# Patient Record
Sex: Male | Born: 1970
Health system: Southern US, Community
[De-identification: ages and names within clinical notes are randomized; demographics above are authoritative.]

## PROBLEM LIST (undated history)

## (undated) ENCOUNTER — Inpatient Hospital Stay: Payer: Self-pay | Admitting: Internal Medicine

## (undated) VITALS — BP 118/81 | HR 92 | Temp 97.7°F | Resp 20 | Ht 71.5 in | Wt 220.0 lb

## (undated) DIAGNOSIS — G40909 Epilepsy, unspecified, not intractable, without status epilepticus: Secondary | ICD-10-CM

## (undated) DIAGNOSIS — F1010 Alcohol abuse, uncomplicated: Secondary | ICD-10-CM

## (undated) DIAGNOSIS — I10 Essential (primary) hypertension: Secondary | ICD-10-CM

## (undated) DIAGNOSIS — F32A Depression, unspecified: Secondary | ICD-10-CM

## (undated) DIAGNOSIS — K219 Gastro-esophageal reflux disease without esophagitis: Secondary | ICD-10-CM

## (undated) DIAGNOSIS — F29 Unspecified psychosis not due to a substance or known physiological condition: Secondary | ICD-10-CM

## (undated) DIAGNOSIS — F419 Anxiety disorder, unspecified: Secondary | ICD-10-CM

## (undated) DIAGNOSIS — K7460 Unspecified cirrhosis of liver: Secondary | ICD-10-CM

## (undated) DIAGNOSIS — R112 Nausea with vomiting, unspecified: Secondary | ICD-10-CM

## (undated) DIAGNOSIS — Z5189 Encounter for other specified aftercare: Secondary | ICD-10-CM

## (undated) DIAGNOSIS — D649 Anemia, unspecified: Secondary | ICD-10-CM

## (undated) DIAGNOSIS — F329 Major depressive disorder, single episode, unspecified: Secondary | ICD-10-CM

## (undated) DIAGNOSIS — F4310 Post-traumatic stress disorder, unspecified: Secondary | ICD-10-CM

## (undated) HISTORY — PX: FOOT SURGERY: SHX648

## (undated) HISTORY — PX: HAND SURGERY: SHX662

## (undated) HISTORY — DX: Nausea with vomiting, unspecified: R11.2

## (undated) HISTORY — DX: Encounter for other specified aftercare: Z51.89

---

## 2011-02-22 ENCOUNTER — Emergency Department (HOSPITAL_COMMUNITY)
Admission: EM | Admit: 2011-02-22 | Discharge: 2011-02-22 | Discharge status: Against Medical Advice | Payer: Self-pay | Attending: Emergency Medicine | Admitting: Emergency Medicine

## 2011-02-22 DIAGNOSIS — R209 Unspecified disturbances of skin sensation: Secondary | ICD-10-CM | POA: Insufficient documentation

## 2011-03-21 ENCOUNTER — Emergency Department (HOSPITAL_COMMUNITY)
Admission: EM | Admit: 2011-03-21 | Discharge: 2011-03-21 | Disposition: A | Discharge status: Home / Self Care | Payer: Self-pay | Attending: Emergency Medicine | Admitting: Emergency Medicine

## 2011-03-21 ENCOUNTER — Emergency Department (HOSPITAL_COMMUNITY): Payer: Self-pay

## 2011-03-21 DIAGNOSIS — G40909 Epilepsy, unspecified, not intractable, without status epilepticus: Secondary | ICD-10-CM | POA: Insufficient documentation

## 2011-03-21 DIAGNOSIS — M25569 Pain in unspecified knee: Secondary | ICD-10-CM | POA: Insufficient documentation

## 2011-03-21 DIAGNOSIS — F3289 Other specified depressive episodes: Secondary | ICD-10-CM | POA: Insufficient documentation

## 2011-03-21 DIAGNOSIS — F329 Major depressive disorder, single episode, unspecified: Secondary | ICD-10-CM | POA: Insufficient documentation

## 2011-03-21 LAB — ETHANOL: Alcohol, Ethyl (B): 138 mg/dL — ABNORMAL HIGH (ref 0–11)

## 2011-03-21 LAB — URINE MICROSCOPIC-ADD ON

## 2011-03-21 LAB — URINALYSIS, ROUTINE W REFLEX MICROSCOPIC
Bilirubin Urine: NEGATIVE
Glucose, UA: NEGATIVE mg/dL
Hgb urine dipstick: NEGATIVE
Ketones, ur: NEGATIVE mg/dL
Nitrite: NEGATIVE
Protein, ur: NEGATIVE mg/dL
Specific Gravity, Urine: 1.018 (ref 1.005–1.030)
Urobilinogen, UA: 0.2 mg/dL (ref 0.0–1.0)
pH: 5.5 (ref 5.0–8.0)

## 2011-03-21 LAB — RAPID URINE DRUG SCREEN, HOSP PERFORMED
Amphetamines: NOT DETECTED
Barbiturates: NOT DETECTED
Benzodiazepines: NOT DETECTED
Cocaine: NOT DETECTED
Opiates: NOT DETECTED
Tetrahydrocannabinol: POSITIVE — AB

## 2011-03-21 LAB — POCT I-STAT, CHEM 8
BUN: 10 mg/dL (ref 6–23)
Calcium, Ion: 1.15 mmol/L (ref 1.12–1.32)
Chloride: 103 mEq/L (ref 96–112)
Creatinine, Ser: 1.1 mg/dL (ref 0.50–1.35)
Glucose, Bld: 91 mg/dL (ref 70–99)
HCT: 52 % (ref 39.0–52.0)
Hemoglobin: 17.7 g/dL — ABNORMAL HIGH (ref 13.0–17.0)
Potassium: 3.8 mEq/L (ref 3.5–5.1)
Sodium: 142 mEq/L (ref 135–145)
TCO2: 26 mmol/L (ref 0–100)

## 2011-03-21 LAB — GLUCOSE, CAPILLARY: Glucose-Capillary: 105 mg/dL — ABNORMAL HIGH (ref 70–99)

## 2011-04-12 ENCOUNTER — Emergency Department (HOSPITAL_COMMUNITY)
Admission: EM | Admit: 2011-04-12 | Discharge: 2011-04-14 | Disposition: A | Discharge status: Other Acute Inpatient Hospital | Payer: Self-pay | Attending: Emergency Medicine | Admitting: Emergency Medicine

## 2011-04-12 DIAGNOSIS — F411 Generalized anxiety disorder: Secondary | ICD-10-CM | POA: Insufficient documentation

## 2011-04-12 DIAGNOSIS — F1010 Alcohol abuse, uncomplicated: Secondary | ICD-10-CM | POA: Insufficient documentation

## 2011-04-12 LAB — DIFFERENTIAL
Basophils Absolute: 0 10*3/uL (ref 0.0–0.1)
Basophils Relative: 0 % (ref 0–1)
Eosinophils Absolute: 0 10*3/uL (ref 0.0–0.7)
Eosinophils Relative: 0 % (ref 0–5)
Lymphocytes Relative: 19 % (ref 12–46)
Lymphs Abs: 1.3 10*3/uL (ref 0.7–4.0)
Monocytes Absolute: 0.6 10*3/uL (ref 0.1–1.0)
Monocytes Relative: 9 % (ref 3–12)
Neutro Abs: 4.8 10*3/uL (ref 1.7–7.7)
Neutrophils Relative %: 72 % (ref 43–77)

## 2011-04-12 LAB — HEPATIC FUNCTION PANEL
ALT: 21 U/L (ref 0–53)
AST: 29 U/L (ref 0–37)
Albumin: 4 g/dL (ref 3.5–5.2)
Alkaline Phosphatase: 95 U/L (ref 39–117)
Bilirubin, Direct: 0.1 mg/dL (ref 0.0–0.3)
Indirect Bilirubin: 0.5 mg/dL (ref 0.3–0.9)
Total Bilirubin: 0.6 mg/dL (ref 0.3–1.2)
Total Protein: 7.2 g/dL (ref 6.0–8.3)

## 2011-04-12 LAB — CBC
HCT: 50.4 % (ref 39.0–52.0)
Hemoglobin: 18 g/dL — ABNORMAL HIGH (ref 13.0–17.0)
MCH: 30.5 pg (ref 26.0–34.0)
MCHC: 35.7 g/dL (ref 30.0–36.0)
MCV: 85.3 fL (ref 78.0–100.0)
Platelets: 231 10*3/uL (ref 150–400)
RBC: 5.91 MIL/uL — ABNORMAL HIGH (ref 4.22–5.81)
RDW: 12.6 % (ref 11.5–15.5)
WBC: 6.7 10*3/uL (ref 4.0–10.5)

## 2011-04-12 LAB — BASIC METABOLIC PANEL
BUN: 16 mg/dL (ref 6–23)
CO2: 30 mEq/L (ref 19–32)
Calcium: 10.2 mg/dL (ref 8.4–10.5)
Chloride: 89 mEq/L — ABNORMAL LOW (ref 96–112)
Creatinine, Ser: 0.79 mg/dL (ref 0.50–1.35)
GFR calc Af Amer: >60 mL/min (ref >60)
GFR calc non Af Amer: >60 mL/min (ref >60)
Glucose, Bld: 145 mg/dL — ABNORMAL HIGH (ref 70–99)
Potassium: 3.2 mEq/L — ABNORMAL LOW (ref 3.5–5.1)
Sodium: 136 mEq/L (ref 135–145)

## 2011-04-12 LAB — RAPID URINE DRUG SCREEN, HOSP PERFORMED
Amphetamines: NOT DETECTED
Barbiturates: NOT DETECTED
Benzodiazepines: NOT DETECTED
Cocaine: NOT DETECTED
Opiates: NOT DETECTED
Tetrahydrocannabinol: POSITIVE — AB

## 2011-04-12 LAB — GLUCOSE, CAPILLARY: Glucose-Capillary: 145 mg/dL — ABNORMAL HIGH (ref 70–99)

## 2011-04-12 LAB — ETHANOL: Alcohol, Ethyl (B): 96 mg/dL — ABNORMAL HIGH (ref 0–11)

## 2011-04-12 LAB — PROTIME-INR
INR: 1.01 (ref 0.00–1.49)
Prothrombin Time: 13.5 seconds (ref 11.6–15.2)

## 2011-04-13 LAB — POTASSIUM: Potassium: 3.3 mEq/L — ABNORMAL LOW (ref 3.5–5.1)

## 2011-04-14 ENCOUNTER — Inpatient Hospital Stay (HOSPITAL_COMMUNITY)
Admission: AD | Admit: 2011-04-14 | Discharge: 2011-05-21 | DRG: 885 | Disposition: A | Discharge status: Other Acute Inpatient Hospital | Payer: PRIVATE HEALTH INSURANCE | Source: Ambulatory Visit | Attending: Psychiatry | Admitting: Psychiatry

## 2011-04-14 DIAGNOSIS — G250 Essential tremor: Secondary | ICD-10-CM

## 2011-04-14 DIAGNOSIS — Z5900 Homelessness unspecified: Secondary | ICD-10-CM

## 2011-04-14 DIAGNOSIS — Z111 Encounter for screening for respiratory tuberculosis: Secondary | ICD-10-CM

## 2011-04-14 DIAGNOSIS — F259 Schizoaffective disorder, unspecified: Principal | ICD-10-CM

## 2011-04-14 DIAGNOSIS — F4310 Post-traumatic stress disorder, unspecified: Secondary | ICD-10-CM

## 2011-04-14 DIAGNOSIS — Z6379 Other stressful life events affecting family and household: Secondary | ICD-10-CM

## 2011-04-14 DIAGNOSIS — Z79899 Other long term (current) drug therapy: Secondary | ICD-10-CM

## 2011-04-14 DIAGNOSIS — R45851 Suicidal ideations: Secondary | ICD-10-CM

## 2011-04-14 DIAGNOSIS — Z560 Unemployment, unspecified: Secondary | ICD-10-CM

## 2011-04-14 DIAGNOSIS — F1020 Alcohol dependence, uncomplicated: Secondary | ICD-10-CM

## 2011-04-14 DIAGNOSIS — T434X5A Adverse effect of butyrophenone and thiothixene neuroleptics, initial encounter: Secondary | ICD-10-CM

## 2011-04-14 DIAGNOSIS — R569 Unspecified convulsions: Secondary | ICD-10-CM

## 2011-04-14 DIAGNOSIS — G252 Other specified forms of tremor: Secondary | ICD-10-CM

## 2011-04-14 DIAGNOSIS — F10939 Alcohol use, unspecified with withdrawal, unspecified: Secondary | ICD-10-CM

## 2011-04-14 DIAGNOSIS — F10239 Alcohol dependence with withdrawal, unspecified: Secondary | ICD-10-CM

## 2011-04-14 DIAGNOSIS — F323 Major depressive disorder, single episode, severe with psychotic features: Secondary | ICD-10-CM

## 2011-04-15 DIAGNOSIS — F329 Major depressive disorder, single episode, unspecified: Secondary | ICD-10-CM

## 2011-04-15 DIAGNOSIS — F1020 Alcohol dependence, uncomplicated: Secondary | ICD-10-CM

## 2011-04-15 DIAGNOSIS — F3289 Other specified depressive episodes: Secondary | ICD-10-CM

## 2011-04-15 DIAGNOSIS — F4310 Post-traumatic stress disorder, unspecified: Secondary | ICD-10-CM

## 2011-04-19 DIAGNOSIS — F4310 Post-traumatic stress disorder, unspecified: Secondary | ICD-10-CM

## 2011-04-19 DIAGNOSIS — F1020 Alcohol dependence, uncomplicated: Secondary | ICD-10-CM

## 2011-04-19 DIAGNOSIS — F259 Schizoaffective disorder, unspecified: Secondary | ICD-10-CM

## 2011-04-24 NOTE — Assessment & Plan Note (Signed)
[name], [name]             ACCOUNT NO.:  [id number]  MEDICAL RECORD NO.:  [id number]  LOCATION:  0300                          FACILITY:  [facility]  PHYSICIAN:  Orson Aloe, MD       DATE OF BIRTH:  12/21/1970  DATE OF ADMISSION:  04/14/2011 DATE OF DISCHARGE:                      PSYCHIATRIC ADMISSION ASSESSMENT   HISTORY OF PRESENT ILLNESS:  This is a 40 year old divorced white male. He presented to the emergency department after an alcohol withdrawal seizure on April 12, 2011.  He was also reporting auditory hallucinations and horrifying nightmare.  He stated that he has had auditory hallucinations for about 10 years.  He was previously treated at the [facility].  He came here to [place] in February as his sister lives here.  About 3 years ago, he developed PTSD on top of his major depressive disorder with psychotic features.  His then fiance had suicided by overdose.  He was trying to perform CPR on her, and this is what his PTSD is from.  PAST PSYCHIATRIC HISTORY:  He reports 1 prior admission at the [facility] [place].  He has had multiple drug trials including Prozac, Risperdal, Seroquel, Klonopin, prazosin, Ambien, Abilify, Seroquel.  He states that the Seroquel seemed to have been the most effective with the hallucinations.  However, it made him feel stiff.  He had been noncompliant with medications since January or February.  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  He has a 93- year-old daughter.  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  He also presented to [name] on August 3.  At that time, his alcohol level was actually higher at 138.  He was also positive for marijuana then.  His neighbor called as he witnessed him having a seizure.  FAMILY HISTORY:  His mother abused alcohol.  ALCOHOL AND DRUG HISTORY:  He has  been using marijuana since age 61, alcohol since 22.  He was sober from 2004 to 2007.  He had minor relapsed until 2009.  This is when his fiance suicided, and he has been pretty much on a steady drunk since then.  PRIMARY CARE PROVIDER:  He has no PCP.  MEDICAL PROBLEMS:  None are known.  MEDICATIONS:  None recently.  DRUG ALLERGIES:  No known drug allergies.  POSITIVE PHYSICAL FINDINGS:  When he was admitted 2 days ago at [facility], he was afebrile.  His pulse ranged from 75-85.  His respirations were 18-24.  His blood pressure was 132/82 to 155/90.  His alcohol level was 96.  Potassium was slightly low at 3.8.  He was positive for marijuana.  MENTAL STATUS EXAM:  Tonight he is alert and oriented.  He was casually but appropriately groomed and dressed in his own clothing.  His speech is not pressured.  His mood is depressed.  His affect is congruent. Thought processes are clear, rational and goal oriented.  He is wanting help.  Judgment and insight are intact.  Concentration and memory are intact.  Intelligence is average.  He is still having suicidal thoughts,  although they are passive in nature, and he is still having auditory hallucinations.  He says it is the voices of Prudy Feeler and Jesus.  The voices tell him that he is not worthy of living.  They do not command him to do anything, but he is terrified to sleep at night or to close his eyes due to him seeing what he describes as Hell x10.  The nightmares he experiences are worse than anyone can imagine, and he drinks to pass out and forget his dreams.  He denied being suicidal.  ADMISSION DIAGNOSES:  AXIS I:  Diagnosis is alcohol dependence with withdrawal seizures, major depressive disorder with psychotic features, auditory hallucinations, PTSD from performing CPR on his fiance who succumbed to her overdose. AXIS III:  No diagnosis is known. AXIS IV:  Poor support, financial job loss. AXIS V:  35.  PLAN:  The plan is to  admit for further support through medically supported detox from alcohol.  He was started on the low-dose Librium protocol.  He will discuss in the morning what medications make sense for him at this point in time, and he is aware of not having any insurance and that the cost of some of these medications.  Will get his records from [facility], and he will consider a longer term substance abuse treatment program to help support his sobriety.     Mickie Leonarda Salon, P.A.-C.   ______________________________ Orson Aloe, MD    MD/MEDQ  D:  04/14/2011  T:  04/15/2011  Job:  [id number]  Electronically Signed by Jaci Lazier ADAMS P.A.-C. on 04/20/2011 01:07:40 PM Electronically Signed by Orson Aloe  on 04/24/2011 04:34:20 PM

## 2011-04-25 LAB — COMPREHENSIVE METABOLIC PANEL
ALT: 41 U/L (ref 0–53)
AST: 25 U/L (ref 0–37)
Albumin: 3.8 g/dL (ref 3.5–5.2)
Alkaline Phosphatase: 81 U/L (ref 39–117)
BUN: 19 mg/dL (ref 6–23)
CO2: 28 mEq/L (ref 19–32)
Calcium: 9.3 mg/dL (ref 8.4–10.5)
Chloride: 100 mEq/L (ref 96–112)
Creatinine, Ser: 0.94 mg/dL (ref 0.50–1.35)
GFR calc Af Amer: >60 mL/min (ref >60)
GFR calc non Af Amer: >60 mL/min (ref >60)
Glucose, Bld: 108 mg/dL — ABNORMAL HIGH (ref 70–99)
Potassium: 4.5 mEq/L (ref 3.5–5.1)
Sodium: 138 mEq/L (ref 135–145)
Total Bilirubin: 0.2 mg/dL — ABNORMAL LOW (ref 0.3–1.2)
Total Protein: 6.8 g/dL (ref 6.0–8.3)

## 2011-04-25 LAB — DIFFERENTIAL
Basophils Absolute: 0 10*3/uL (ref 0.0–0.1)
Basophils Relative: 1 % (ref 0–1)
Eosinophils Absolute: 0.1 10*3/uL (ref 0.0–0.7)
Eosinophils Relative: 1 % (ref 0–5)
Lymphocytes Relative: 30 % (ref 12–46)
Lymphs Abs: 2.1 10*3/uL (ref 0.7–4.0)
Monocytes Absolute: 0.8 10*3/uL (ref 0.1–1.0)
Monocytes Relative: 11 % (ref 3–12)
Neutro Abs: 3.9 10*3/uL (ref 1.7–7.7)
Neutrophils Relative %: 57 % (ref 43–77)

## 2011-04-25 LAB — CBC
HCT: 39.5 % (ref 39.0–52.0)
Hemoglobin: 13.2 g/dL (ref 13.0–17.0)
MCH: 30.2 pg (ref 26.0–34.0)
MCHC: 33.4 g/dL (ref 30.0–36.0)
MCV: 90.4 fL (ref 78.0–100.0)
Platelets: 360 10*3/uL (ref 150–400)
RBC: 4.37 MIL/uL (ref 4.22–5.81)
RDW: 13.4 % (ref 11.5–15.5)
WBC: 6.9 10*3/uL (ref 4.0–10.5)

## 2011-04-26 LAB — CARBAMAZEPINE LEVEL, TOTAL: Carbamazepine Lvl: 6.9 ug/mL (ref 4.0–12.0)

## 2011-04-26 LAB — TSH: TSH: 1.417 u[IU]/mL (ref 0.350–4.500)

## 2011-04-26 LAB — T4, FREE: Free T4: 1.08 ng/dL (ref 0.80–1.80)

## 2011-04-26 LAB — T3, FREE: T3, Free: 2.7 pg/mL (ref 2.3–4.2)

## 2011-04-28 LAB — CARBAMAZEPINE LEVEL, TOTAL: Carbamazepine Lvl: 7.5 ug/mL (ref 4.0–12.0)

## 2011-05-18 LAB — COMPREHENSIVE METABOLIC PANEL
ALT: 262 U/L — ABNORMAL HIGH (ref 0–53)
AST: 170 U/L — ABNORMAL HIGH (ref 0–37)
Albumin: 4.2 g/dL (ref 3.5–5.2)
Alkaline Phosphatase: 102 U/L (ref 39–117)
BUN: 16 mg/dL (ref 6–23)
CO2: 27 mEq/L (ref 19–32)
Calcium: 10 mg/dL (ref 8.4–10.5)
Chloride: 99 mEq/L (ref 96–112)
Creatinine, Ser: 0.85 mg/dL (ref 0.50–1.35)
GFR calc Af Amer: >60 mL/min (ref >60)
GFR calc non Af Amer: >60 mL/min (ref >60)
Glucose, Bld: 88 mg/dL (ref 70–99)
Potassium: 3.9 mEq/L (ref 3.5–5.1)
Sodium: 138 mEq/L (ref 135–145)
Total Bilirubin: 0.2 mg/dL — ABNORMAL LOW (ref 0.3–1.2)
Total Protein: 7.4 g/dL (ref 6.0–8.3)

## 2011-05-18 LAB — DIFFERENTIAL
Basophils Absolute: 0 10*3/uL (ref 0.0–0.1)
Basophils Relative: 1 % (ref 0–1)
Eosinophils Absolute: 0.2 10*3/uL (ref 0.0–0.7)
Eosinophils Relative: 4 % (ref 0–5)
Lymphocytes Relative: 37 % (ref 12–46)
Lymphs Abs: 2.1 10*3/uL (ref 0.7–4.0)
Monocytes Absolute: 0.7 10*3/uL (ref 0.1–1.0)
Monocytes Relative: 12 % (ref 3–12)
Neutro Abs: 2.6 10*3/uL (ref 1.7–7.7)
Neutrophils Relative %: 47 % (ref 43–77)

## 2011-05-18 LAB — CBC
HCT: 42.1 % (ref 39.0–52.0)
Hemoglobin: 14.1 g/dL (ref 13.0–17.0)
MCH: 30.1 pg (ref 26.0–34.0)
MCHC: 33.5 g/dL (ref 30.0–36.0)
MCV: 89.8 fL (ref 78.0–100.0)
Platelets: 206 10*3/uL (ref 150–400)
RBC: 4.69 MIL/uL (ref 4.22–5.81)
RDW: 13.6 % (ref 11.5–15.5)
WBC: 5.6 10*3/uL (ref 4.0–10.5)

## 2011-05-18 LAB — CARBAMAZEPINE LEVEL, TOTAL: Carbamazepine Lvl: 6.4 ug/mL (ref 4.0–12.0)

## 2011-05-19 DIAGNOSIS — F329 Major depressive disorder, single episode, unspecified: Secondary | ICD-10-CM

## 2011-05-19 DIAGNOSIS — F1020 Alcohol dependence, uncomplicated: Secondary | ICD-10-CM

## 2011-05-19 DIAGNOSIS — F3289 Other specified depressive episodes: Secondary | ICD-10-CM

## 2011-05-20 NOTE — Discharge Summary (Signed)
NAMECHAD, [name]             ACCOUNT NO.:  [id number]  MEDICAL RECORD NO.:  [id number]  LOCATION:  0503                          FACILITY:  [facility]  PHYSICIAN:  Franchot Gallo, MD     DATE OF BIRTH:  Dec 19, 1970  DATE OF ADMISSION:  04/14/2011 DATE OF DISCHARGE:  05/20/2011                              DISCHARGE SUMMARY   REASON FOR ADMISSION:  This is a 40 year old male who was admitted after he initially had an alcohol withdrawal seizure on August 25th.  He was reporting auditory hallucinations and horrifying nightmares.  Stated he had been having auditory hallucinations for approximately 10 years.  He also has a history of PTSD and is reporting that one of his significant stressors is that he is homeless.  FINAL IMPRESSION:  AXIS I:  Schizoaffective disorder, depressed type. Posttraumatic stress disorder. AXIS II:  Deferred. AXIS III:  No acute medical issues. AXIS IV:  Homelessness, financial constraints, unemployment, chronic mental issues. AXIS V:  GAF at discharge is 40.  PERTINENT LABORATORIES:  Alcohol level was at 96.  Potassium was initially low at 3.8.  Urine drug screen was positive for marijuana.  SIGNIFICANT FINDINGS:  The patient was admitted to the adult milieu for safety and stabilization.  He was started on low-dose Librium, and he was assessed for further comorbidities and initiation of medications. He was active in attending discharge planning groups.  He was, however, endorsing suicidal thoughts.  We had contact with [facility] to address any safety issues and for [place] to provide information on suicide risk factors, prevention and intervention.  Early in his admission, the patient had attempted to try to make a rope out of his gown to hang himself but then offered the gown to the nurse in the hall and was able to contract for safety.  He continued to endorse suicidal thoughts with a plan to overdose and was also endorsing auditory hallucinations with  paranoid ideation.  He was having significant anxiety, and we increased his Risperdal.  The patient was interested to be transferred to [facility] for further evaluation and long-term management of his symptoms.  The patient was feeling very overwhelmed by his auditory hallucinations. He paced the floor and continued to endorse suicidal thinking.  He detoxed well off his use of alcohol and had no significant withdrawal symptoms at this time.  The patient continued to endorse constant suicidality but having no homicidal thinking.  He also reported constant auditory hallucinations that were worse at night, seeing distortions and rating his hopelessness a 10 on a scale of 1 to 10.  We discontinued his Risperdal and started Seroquel to help with his psychotic symptoms, changed his Klonopin to help with his anxiety, Tegretol for mood stabilization and ordered further labs.  We continued to modify the patient's dosages of his medicine, increased his Seroquel to lessen his psychotic symptoms and help with his anxiety.  The patient continued to voice that he was wanting to be transferred to the [facility].  Throughout most of his stay, the patient endorsed constant suicidal thoughts without plan or intent, and reporting that voices were present and strong and seeing shadow people.  He continued to rate his  depression high, an 8 to 9 on a scale of 1 to 10, and his hopelessness an 8 on a scale of 1 to 10.  The voices and his delusions were basically unchanged.  We increased his Prozac to help with his depressive symptoms.  His sleep was improving some, and we continued to wait for transfer to [facility].  For the next several days, the patient had very little improvement.  His sleep was better.  His appetite had improved, but the voices were constant, and he continued to endorse suicidal thinking.  He did confide in the staff that he was hearing voices.  One of them was Jesus and the other  was Lucifer.  He was reporting that they talked to him at the same time, telling him they wanted to stab him and perform a stigma on him.  He reported just slight improvement in his symptoms, began to overall continue with again the voices and suicidal thinking.  The patient had a tremor with the Haldol.  His vision was blurred.  At that time, we discontinued his Haldol and tried Thorazine 25 mg t.i.d. and 50 mg at bedtime to help with his psychotic symptoms.  On October 1, the patient was reporting good sleep and good appetite but continuing with severe depressive symptoms, feeling very frustrated.  Continued with episodic suicidal thinking without plan or intent.  Denied any homicidal thinking.  His auditory and visual hallucinations continued, but he was able to cope with the Thorazine.  He also rated his anxiety an 8 on a scale of 1 to 10.  His liver enzymes at this time were elevated with an SGOT of 170, SGPT of 262.  Serum Tegretol level was 6.4.  We discontinued his Tegretol at this time due to increase in liver enzymes with our intention to repeat his liver enzymes the following day.  On the day of transfer, the patient was reporting good sleep, good appetite, having severe depressive symptoms, rating an 8 on a scale of 1 to 10.  Continued with episodic suicidal thinking without current plan or intent, having current auditory and visual hallucinations.  Anxiety was rated an 8, severe, on a scale of 1 to 10, but reporting no medication side effects.  DISCHARGE MEDICATIONS: 1. Minipress 4 mg with a repeat in 1 hour if not asleep. 2. Multivitamin 1 daily. 3. Seroquel 200 mg at 6:00 a.m., 100 mg at 2:00 p.m., 500 mg at     bedtime. 4. Klonopin 1 mg at 8:00 a.m., 1 mg at 2:00 p.m., 2 mg at bedtime. 5. Prozac 80 mg daily. 6. Thorazine 50 mg at 6:00 a.m., 100 mg at 12:00 noon, 4:00 p.m. and     at 10:00 p.m. 7. Cogentin 0.5 mg 1 at 6:00 a.m., 12:00 noon, 4:00 p.m., 10:00 p.m. 8.  The patient received a PPD.  FOLLOWUP:  The patient is to have his liver enzymes checked as soon as possible for further monitoring.     Landry Corporal, N.P.   ______________________________ Franchot Gallo, MD    JO/MEDQ  D:  05/20/2011  T:  05/20/2011  Job:  [id number]  Electronically Signed by Limmie PatriciaP. on 05/20/2011 04:14:20 PM Electronically Signed by Franchot Gallo MD on 05/20/2011 04:45:28 PM

## 2011-05-21 LAB — COMPREHENSIVE METABOLIC PANEL
ALT: 436 U/L — ABNORMAL HIGH (ref 0–53)
AST: 167 U/L — ABNORMAL HIGH (ref 0–37)
Albumin: 4.4 g/dL (ref 3.5–5.2)
Alkaline Phosphatase: 135 U/L — ABNORMAL HIGH (ref 39–117)
BUN: 14 mg/dL (ref 6–23)
CO2: 28 mEq/L (ref 19–32)
Calcium: 10.5 mg/dL (ref 8.4–10.5)
Chloride: 101 mEq/L (ref 96–112)
Creatinine, Ser: 0.86 mg/dL (ref 0.50–1.35)
GFR calc Af Amer: >90 mL/min (ref >90)
GFR calc non Af Amer: >90 mL/min (ref >90)
Glucose, Bld: 88 mg/dL (ref 70–99)
Potassium: 4 mEq/L (ref 3.5–5.1)
Sodium: 138 mEq/L (ref 135–145)
Total Bilirubin: 0.4 mg/dL (ref 0.3–1.2)
Total Protein: 7.6 g/dL (ref 6.0–8.3)

## 2011-06-30 ENCOUNTER — Encounter: Payer: Self-pay | Admitting: *Deleted

## 2011-06-30 ENCOUNTER — Inpatient Hospital Stay (HOSPITAL_COMMUNITY)
Admission: EM | Admit: 2011-06-30 | Discharge: 2011-07-16 | DRG: 392 | Disposition: A | Discharge status: Other Acute Inpatient Hospital | Payer: Self-pay | Attending: Internal Medicine | Admitting: Internal Medicine

## 2011-06-30 ENCOUNTER — Other Ambulatory Visit: Payer: Self-pay

## 2011-06-30 DIAGNOSIS — E860 Dehydration: Secondary | ICD-10-CM | POA: Diagnosis present

## 2011-06-30 DIAGNOSIS — F251 Schizoaffective disorder, depressive type: Secondary | ICD-10-CM | POA: Insufficient documentation

## 2011-06-30 DIAGNOSIS — G44209 Tension-type headache, unspecified, not intractable: Secondary | ICD-10-CM | POA: Diagnosis present

## 2011-06-30 DIAGNOSIS — F1020 Alcohol dependence, uncomplicated: Secondary | ICD-10-CM | POA: Diagnosis present

## 2011-06-30 DIAGNOSIS — K529 Noninfective gastroenteritis and colitis, unspecified: Secondary | ICD-10-CM | POA: Diagnosis present

## 2011-06-30 DIAGNOSIS — K5900 Constipation, unspecified: Secondary | ICD-10-CM | POA: Diagnosis not present

## 2011-06-30 DIAGNOSIS — F1910 Other psychoactive substance abuse, uncomplicated: Secondary | ICD-10-CM | POA: Diagnosis present

## 2011-06-30 DIAGNOSIS — F4310 Post-traumatic stress disorder, unspecified: Secondary | ICD-10-CM | POA: Diagnosis present

## 2011-06-30 DIAGNOSIS — F259 Schizoaffective disorder, unspecified: Secondary | ICD-10-CM | POA: Diagnosis present

## 2011-06-30 DIAGNOSIS — F329 Major depressive disorder, single episode, unspecified: Secondary | ICD-10-CM | POA: Diagnosis present

## 2011-06-30 DIAGNOSIS — F10939 Alcohol use, unspecified with withdrawal, unspecified: Secondary | ICD-10-CM | POA: Diagnosis present

## 2011-06-30 DIAGNOSIS — F1010 Alcohol abuse, uncomplicated: Secondary | ICD-10-CM | POA: Diagnosis present

## 2011-06-30 DIAGNOSIS — F17200 Nicotine dependence, unspecified, uncomplicated: Secondary | ICD-10-CM | POA: Diagnosis present

## 2011-06-30 DIAGNOSIS — R45851 Suicidal ideations: Secondary | ICD-10-CM

## 2011-06-30 DIAGNOSIS — Z765 Malingerer [conscious simulation]: Secondary | ICD-10-CM

## 2011-06-30 DIAGNOSIS — F3289 Other specified depressive episodes: Secondary | ICD-10-CM | POA: Diagnosis present

## 2011-06-30 DIAGNOSIS — F10239 Alcohol dependence with withdrawal, unspecified: Secondary | ICD-10-CM | POA: Diagnosis present

## 2011-06-30 DIAGNOSIS — E876 Hypokalemia: Secondary | ICD-10-CM | POA: Diagnosis not present

## 2011-06-30 DIAGNOSIS — F10929 Alcohol use, unspecified with intoxication, unspecified: Secondary | ICD-10-CM | POA: Diagnosis present

## 2011-06-30 DIAGNOSIS — K5289 Other specified noninfective gastroenteritis and colitis: Principal | ICD-10-CM | POA: Diagnosis present

## 2011-06-30 HISTORY — DX: Epilepsy, unspecified, not intractable, without status epilepticus: G40.909

## 2011-06-30 HISTORY — DX: Depression, unspecified: F32.A

## 2011-06-30 HISTORY — DX: Unspecified psychosis not due to a substance or known physiological condition: F29

## 2011-06-30 HISTORY — DX: Major depressive disorder, single episode, unspecified: F32.9

## 2011-06-30 HISTORY — DX: Alcohol abuse, uncomplicated: F10.10

## 2011-06-30 HISTORY — DX: Post-traumatic stress disorder, unspecified: F43.10

## 2011-06-30 LAB — CBC
HCT: 50 % (ref 39.0–52.0)
Hemoglobin: 17.8 g/dL — ABNORMAL HIGH (ref 13.0–17.0)
MCH: 31.3 pg (ref 26.0–34.0)
MCHC: 35.6 g/dL (ref 30.0–36.0)
MCV: 88 fL (ref 78.0–100.0)
Platelets: 344 10*3/uL (ref 150–400)
RBC: 5.68 MIL/uL (ref 4.22–5.81)
RDW: 12.8 % (ref 11.5–15.5)
WBC: 7.2 10*3/uL (ref 4.0–10.5)

## 2011-06-30 LAB — COMPREHENSIVE METABOLIC PANEL
ALT: 35 U/L (ref 0–53)
AST: 36 U/L (ref 0–37)
Albumin: 4.4 g/dL (ref 3.5–5.2)
Alkaline Phosphatase: 102 U/L (ref 39–117)
BUN: 22 mg/dL (ref 6–23)
CO2: 31 mEq/L (ref 19–32)
Calcium: 9.3 mg/dL (ref 8.4–10.5)
Chloride: 92 mEq/L — ABNORMAL LOW (ref 96–112)
Creatinine, Ser: 0.77 mg/dL (ref 0.50–1.35)
GFR calc Af Amer: >90 mL/min (ref >90)
GFR calc non Af Amer: >90 mL/min (ref >90)
Glucose, Bld: 123 mg/dL — ABNORMAL HIGH (ref 70–99)
Potassium: 2.7 mEq/L — CL (ref 3.5–5.1)
Sodium: 139 mEq/L (ref 135–145)
Total Bilirubin: 0.9 mg/dL (ref 0.3–1.2)
Total Protein: 7.6 g/dL (ref 6.0–8.3)

## 2011-06-30 LAB — POCT I-STAT, CHEM 8
BUN: 23 mg/dL (ref 6–23)
Calcium, Ion: 0.97 mmol/L — ABNORMAL LOW (ref 1.12–1.32)
Chloride: 94 mEq/L — ABNORMAL LOW (ref 96–112)
Creatinine, Ser: 1.2 mg/dL (ref 0.50–1.35)
Glucose, Bld: 126 mg/dL — ABNORMAL HIGH (ref 70–99)
HCT: 54 % — ABNORMAL HIGH (ref 39.0–52.0)
Hemoglobin: 18.4 g/dL — ABNORMAL HIGH (ref 13.0–17.0)
Potassium: 2.7 mEq/L — CL (ref 3.5–5.1)
Sodium: 139 mEq/L (ref 135–145)
TCO2: 33 mmol/L (ref 0–100)

## 2011-06-30 LAB — RAPID URINE DRUG SCREEN, HOSP PERFORMED
Amphetamines: NOT DETECTED
Barbiturates: NOT DETECTED
Benzodiazepines: NOT DETECTED
Cocaine: NOT DETECTED
Opiates: NOT DETECTED
Tetrahydrocannabinol: POSITIVE — AB

## 2011-06-30 LAB — MAGNESIUM: Magnesium: 2.4 mg/dL (ref 1.5–2.5)

## 2011-06-30 LAB — MRSA PCR SCREENING: MRSA by PCR: NEGATIVE

## 2011-06-30 LAB — PHOSPHORUS: Phosphorus: 2.6 mg/dL (ref 2.3–4.6)

## 2011-06-30 LAB — ETHANOL: Alcohol, Ethyl (B): 211 mg/dL — ABNORMAL HIGH (ref 0–11)

## 2011-06-30 MED ORDER — LORAZEPAM 1 MG PO TABS
1.0000 mg | ORAL_TABLET | Freq: Four times a day (QID) | ORAL | Status: AC | PRN
  Administered 2011-06-30 – 2011-07-02 (×2): 1 mg via ORAL
  Filled 2011-06-30 (×2): qty 1

## 2011-06-30 MED ORDER — FOLIC ACID 5 MG/ML IJ SOLN
1.0000 mg | Freq: Every day | INTRAMUSCULAR | Status: DC
  Administered 2011-06-30: 1 mg via INTRAVENOUS
  Filled 2011-06-30 (×2): qty 0.2

## 2011-06-30 MED ORDER — POTASSIUM CHLORIDE CRYS ER 20 MEQ PO TBCR
40.0000 meq | EXTENDED_RELEASE_TABLET | Freq: Once | ORAL | Status: AC
  Administered 2011-06-30: 40 meq via ORAL
  Filled 2011-06-30: qty 2

## 2011-06-30 MED ORDER — THIAMINE HCL 100 MG/ML IJ SOLN
Freq: Once | INTRAVENOUS | Status: AC
  Administered 2011-06-30: 13:00:00 via INTRAVENOUS
  Filled 2011-06-30: qty 1000

## 2011-06-30 MED ORDER — NICOTINE 21 MG/24HR TD PT24
21.0000 mg | MEDICATED_PATCH | Freq: Every day | TRANSDERMAL | Status: DC
  Administered 2011-06-30 – 2011-07-16 (×17): 21 mg via TRANSDERMAL
  Filled 2011-06-30 (×17): qty 1

## 2011-06-30 MED ORDER — ONDANSETRON HCL 4 MG PO TABS: 4.0000 mg | ORAL_TABLET | Freq: Four times a day (QID) | ORAL | Status: DC | PRN

## 2011-06-30 MED ORDER — ACETAMINOPHEN 650 MG RE SUPP: 650.0000 mg | Freq: Four times a day (QID) | RECTAL | Status: DC | PRN

## 2011-06-30 MED ORDER — POTASSIUM CHLORIDE 10 MEQ/100ML IV SOLN
10.0000 meq | Freq: Once | INTRAVENOUS | Status: AC
  Administered 2011-06-30: 10 meq via INTRAVENOUS
  Filled 2011-06-30: qty 100

## 2011-06-30 MED ORDER — OLANZAPINE 5 MG PO TABS
5.0000 mg | ORAL_TABLET | Freq: Every day | ORAL | Status: DC
  Administered 2011-06-30: 5 mg via ORAL
  Filled 2011-06-30 (×3): qty 1

## 2011-06-30 MED ORDER — THIAMINE HCL 100 MG/ML IJ SOLN
100.0000 mg | Freq: Every day | INTRAMUSCULAR | Status: DC
  Administered 2011-06-30: 100 mg via INTRAVENOUS
  Filled 2011-06-30 (×2): qty 1

## 2011-06-30 MED ORDER — ACETAMINOPHEN 325 MG PO TABS
650.0000 mg | ORAL_TABLET | Freq: Once | ORAL | Status: AC
  Administered 2011-06-30: 650 mg via ORAL

## 2011-06-30 MED ORDER — LORAZEPAM 2 MG/ML IJ SOLN
0.0000 mg | Freq: Four times a day (QID) | INTRAMUSCULAR | Status: AC
  Administered 2011-06-30 – 2011-07-02 (×6): 1 mg via INTRAVENOUS
  Filled 2011-06-30 (×5): qty 1

## 2011-06-30 MED ORDER — LORAZEPAM 2 MG/ML IJ SOLN
0.0000 mg | Freq: Two times a day (BID) | INTRAMUSCULAR | Status: AC
  Administered 2011-07-02: 1 mg via INTRAVENOUS
  Administered 2011-07-03: 2 mg via INTRAVENOUS
  Administered 2011-07-03: 1 mg via INTRAVENOUS
  Administered 2011-07-04: 2 mg via INTRAVENOUS
  Filled 2011-06-30 (×4): qty 1

## 2011-06-30 MED ORDER — ONDANSETRON HCL 4 MG/2ML IJ SOLN
4.0000 mg | Freq: Four times a day (QID) | INTRAMUSCULAR | Status: DC | PRN
  Administered 2011-07-01: 4 mg via INTRAVENOUS
  Filled 2011-06-30: qty 2

## 2011-06-30 MED ORDER — ACETAMINOPHEN 325 MG PO TABS
650.0000 mg | ORAL_TABLET | Freq: Four times a day (QID) | ORAL | Status: DC | PRN
  Administered 2011-07-02 – 2011-07-06 (×3): 650 mg via ORAL
  Filled 2011-06-30 (×3): qty 2

## 2011-06-30 MED ORDER — SODIUM CHLORIDE 0.9 % IV BOLUS (SEPSIS)
500.0000 mL | Freq: Once | INTRAVENOUS | Status: AC
  Administered 2011-06-30: 500 mL via INTRAVENOUS

## 2011-06-30 MED ORDER — PANTOPRAZOLE SODIUM 40 MG IV SOLR
40.0000 mg | INTRAVENOUS | Status: DC
  Administered 2011-06-30: 40 mg via INTRAVENOUS
  Filled 2011-06-30 (×2): qty 40

## 2011-06-30 MED ORDER — FLUOXETINE HCL 20 MG PO CAPS
60.0000 mg | ORAL_CAPSULE | Freq: Every day | ORAL | Status: DC
  Administered 2011-06-30 – 2011-07-16 (×17): 60 mg via ORAL
  Filled 2011-06-30 (×17): qty 3

## 2011-06-30 MED ORDER — SODIUM CHLORIDE 0.9 % IV SOLN
INTRAVENOUS | Status: DC
  Administered 2011-06-30 – 2011-07-01 (×2): 125 mL/h via INTRAVENOUS

## 2011-06-30 MED ORDER — ACETAMINOPHEN 325 MG PO TABS
ORAL_TABLET | ORAL | Status: AC
  Administered 2011-06-30: 650 mg via ORAL
  Filled 2011-06-30: qty 2

## 2011-06-30 MED ORDER — LORAZEPAM 2 MG/ML IJ SOLN
1.0000 mg | Freq: Once | INTRAMUSCULAR | Status: AC
  Administered 2011-06-30: 1 mg via INTRAVENOUS
  Filled 2011-06-30: qty 1

## 2011-06-30 MED ORDER — HYDROMORPHONE HCL PF 1 MG/ML IJ SOLN: 1.0000 mg | INTRAMUSCULAR | Status: DC | PRN

## 2011-06-30 MED ORDER — LORAZEPAM 2 MG/ML IJ SOLN
1.0000 mg | Freq: Four times a day (QID) | INTRAMUSCULAR | Status: AC | PRN
  Administered 2011-07-01 (×2): 1 mg via INTRAVENOUS
  Filled 2011-06-30 (×3): qty 1

## 2011-06-30 MED ORDER — OXYCODONE HCL 5 MG PO TABS
5.0000 mg | ORAL_TABLET | ORAL | Status: DC | PRN
  Administered 2011-07-01 – 2011-07-09 (×26): 5 mg via ORAL
  Filled 2011-06-30 (×26): qty 1

## 2011-06-30 MED ORDER — HEPARIN SODIUM (PORCINE) 5000 UNIT/ML IJ SOLN
5000.0000 [IU] | Freq: Three times a day (TID) | INTRAMUSCULAR | Status: DC
  Administered 2011-06-30 – 2011-07-06 (×17): 5000 [IU] via SUBCUTANEOUS
  Filled 2011-06-30 (×20): qty 1

## 2011-06-30 MED ORDER — ONDANSETRON HCL 4 MG/2ML IJ SOLN
4.0000 mg | Freq: Once | INTRAMUSCULAR | Status: AC
  Administered 2011-06-30: 4 mg via INTRAVENOUS
  Filled 2011-06-30: qty 2

## 2011-06-30 NOTE — ED Notes (Addendum)
Pt states he was discharged from [facility] in [place] 2 weeks ago where he was seen for ETOH abuse and PTSD. States she went approximately without drinking and started drinking 5-6 [employer]/alcohol drinks per day x 1 week. States he stopped drinking at 0330 this am because he wants help. C/O nausea, vomiting, and agitation. Also states he had a seizure "early this morning" witnessed by girlfriend, unable to give a length of time. Describes as full body movement. States he has a hx of seizures when withdrawing from ETOH in the past.

## 2011-06-30 NOTE — ED Notes (Signed)
Report called to Molly Maduro, Charity fundraiser. Requests we wait about 10 minutes before transporting pt upstairs.

## 2011-06-30 NOTE — Discharge Planning (Signed)
ED CM provided pt with a housing, financial and medication assistance (including [facility] contact information) During previous CM visit. CM will refer pt to SW for consult for homelessness services

## 2011-06-30 NOTE — ED Notes (Signed)
Dr. Nicanor Alcon notified of pt's headache.

## 2011-06-30 NOTE — Discharge Planning (Signed)
ED CM noted no pcp self pay [place] pt. Spoke with and provided pt with with written information for self pay pcps.  Pt voiced understanding Left near pt belongs

## 2011-06-30 NOTE — ED Notes (Signed)
Pt states "last drink around 0300 or 0400"; girlfriend states "I don't know what time he had the sz but it was scaring me to death"

## 2011-06-30 NOTE — ED Notes (Signed)
Shanda Bumps, pt girlfriend # [phone]

## 2011-06-30 NOTE — ED Provider Notes (Signed)
History     CSN: [id number] Arrival date & time: 06/30/2011  9:55 AM   First MD Initiated Contact with Patient 06/30/11 1019      Chief Complaint  Patient presents with  . Alcohol Intoxication    (Consider location/radiation/quality/duration/timing/severity/associated sxs/prior treatment) Patient is a 40 y.o. male presenting with intoxication. The history is provided by the patient and the spouse. No language interpreter was used.  Alcohol Intoxication This is a recurrent problem. The current episode started 12 to 24 hours ago. The problem occurs constantly. The problem has not changed since onset.Pertinent negatives include no chest pain, no abdominal pain, no headaches and no shortness of breath. The symptoms are aggravated by nothing. The symptoms are relieved by nothing. He has tried nothing for the symptoms. The treatment provided no relief.  Last drink was 330 am and had a withdrawal seizure.  He has had these in the past when he tried to stop.  Wants to go back to detox was just there 2 weeks ago.    Past Medical History  Diagnosis Date  . Depression   . Psychosis   . PTSD (post-traumatic stress disorder)     Past Surgical History  Procedure Date  . Foot surgery     right    No family history on file.  History  Substance Use Topics  . Smoking status: Current Everyday Smoker -- 2.0 packs/day  . Smokeless tobacco: Not on file  . Alcohol Use: Yes     1 fifth per day      Review of Systems  Constitutional: Negative for activity change.  HENT: Negative for facial swelling.   Eyes: Negative for discharge.  Respiratory: Negative for shortness of breath.   Cardiovascular: Negative for chest pain.  Gastrointestinal: Negative for abdominal pain and abdominal distention.  Genitourinary: Negative for difficulty urinating.  Musculoskeletal: Negative for arthralgias.  Neurological: Negative for weakness and headaches.  Hematological: Negative.     Psychiatric/Behavioral: Negative.     Allergies  Review of patient's allergies indicates no known allergies.  Home Medications  No current outpatient prescriptions on file.  BP 139/97  Pulse 108  Temp 98.1 F (36.7 C)  Resp 20  Wt 200 lb (90.719 kg)  SpO2 96%  Physical Exam  Constitutional: He is oriented to person, place, and time. He appears well-developed and well-nourished. No distress.  HENT:  Head: Normocephalic and atraumatic.  Mouth/Throat: Oropharynx is clear and moist. No oropharyngeal exudate.  Eyes: EOM are normal. Pupils are equal, round, and reactive to light. Right eye exhibits no discharge. Left eye exhibits no discharge.  Neck: Normal range of motion. Neck supple. No thyromegaly present.  Cardiovascular: Normal rate and regular rhythm.   Pulmonary/Chest: Effort normal and breath sounds normal. No respiratory distress.  Abdominal: Soft. Bowel sounds are normal. There is no tenderness. There is no rebound and no guarding.  Musculoskeletal: Normal range of motion.  Neurological: He is alert and oriented to person, place, and time.  Skin: Skin is warm and dry.  Psychiatric: Thought content normal.    ED Course  Procedures (including critical care time)   Labs Reviewed  CBC  COMPREHENSIVE METABOLIC PANEL  I-STAT, CHEM 8  URINE RAPID DRUG SCREEN (HOSP PERFORMED)  ETHANOL   No results found.   No diagnosis found.    MDM  CRITICAL CARE Performed by: Jasmine Awe   Total critical care time: 60  Critical care time was exclusive of separately billable procedures and treating other patients.  Critical care was necessary to treat or prevent imminent or life-threatening deterioration.  Critical care was time spent personally by me on the following activities: development of treatment plan with patient and/or surrogate as well as nursing, discussions with consultants, evaluation of patient's response to treatment, examination of patient,  obtaining history from patient or surrogate, ordering and performing treatments and interventions, ordering and review of laboratory studies, ordering and review of radiographic studies, pulse oximetry and re-evaluation of patient's condition. MDM Reviewed: nursing note and vitals Interpretation: labs and ECG Total time providing critical care: 30-74 minutes. This excludes time spent performing separately reportable procedures and services. Consults: admitting MD           Trinitee Horgan K Zared Knoth-Rasch, MD 06/30/11 1336

## 2011-06-30 NOTE — H&P (Signed)
PCP:   No primary provider on file.   Chief Complaint:  Vomiting and diarrhea for 3-4 days, abdominal pain. Possible seizure episode.  HPI: This is a 40 year old male, with  known history of chronic alcoholism, PTSD, schizoaffective disorder depressive type, status [facility], [facility] one in [facility], presenting with what appears to have been a "seizure" at approximately 40:00 a.m on 06/30/2011. according to patient over the past 3-4 days had vomiting and diarrhea at least 4-6 episodes. The he describes the diarrhea as consisting of "dark stools", denies ingestion of any unusual foods or clustering of cases, denies fever or chills. He also admits to periumbilical pain over that period of time.  His last drink was at about 3 AM on 06/30/11, and according to patient he awoke at 400 a.m on the same day, following what his girlfriend described as a "seizure".  Be that as it may, he presented to the emergency department. He was administered at Ativan, and referred to the medical service mission for further management.  Allergies:  No Known Allergies    Past Medical History  Diagnosis Date  . Depression   . Psychosis   . PTSD (post-traumatic stress disorder)   . Seizure disorder     related to etoh seizure    Past Surgical History  Procedure Date  . Foot surgery     right    Prior to Admission medications   Medication Sig Start Date End Date Taking? Authorizing Provider  FLUoxetine (PROZAC) 20 MG capsule Take 60 mg by mouth daily. Patient takes (3) 20mg  capsules daily    Yes Historical Provider, MD  OLANZapine (ZYPREXA) 2.5 MG tablet Take 5 mg by mouth at bedtime. Patient states that he is taking 5mg .    Yes Historical Provider, MD  prazosin (MINIPRESS) 2 MG capsule Take 4 mg by mouth at bedtime. Patient takes (2) 4mg  daily    Yes Historical Provider, MD    Social History: He smokes a pack to one and a half pack of cigarettes a day  for the past 20 years.  He has never used smokeless tobacco. He reports that he drinks alcohol, about 5-6, 24 oz cans a day, and smoke marijuana.  Family history: Patient's mother is aged 74 years, and is otherwise healthy, apart from glaucoma. His fathwer died at age 35 years, from a heart attack.  Review of Systems:  As per HPI and chief complaint. Patent denies fatigue, diminished appetite, weight loss, fever, chills, headache, blurred vision, difficulty in speaking, dysphagia, chest pain, cough, shortness of breath, orthopnea, paroxysmal nocturnal dyspnea, belching, heartburn, hematemesis, melena, dysuria, nocturia, urinary frequency, hematochezia, lower extremity swelling, pain, or redness. The rest of the systems review is negative.  Physical Exam:  General:  Patient does not appear to be in obvious acute distress. Alert, communicative, fully oriented, talking in complete sentences, not short of breath at rest.  HEENT:  No clinical pallor, no jaundice, no conjunctival injection or discharge. Mucosae appear "dry". NECK:  Supple, JVP not seen, no carotid bruits, no palpable lymphadenopathy, no palpable goiter. CHEST:  Clinically clear to auscultation, no wheezes, no crackles. HEART:  Sounds 1 and 2 heard, normal, regular, no murmurs. ABDOMEN:  Full, soft, only mild to moderate periumbilical discomfort, no palpable organomegaly, no palpable masses, normal bowel sounds. GENITALIA:  Normal external genitalia. LOWER EXTREMITIES:  No pitting edema, palpable peripheral pulses. MUSCULOSKELETAL SYSTEM:  Generalized osteoarthritic changes, otherwise, normal. CENTRAL NERVOUS SYSTEM:  No focal neurologic deficit on gross examination.  Labs on Admission:  Results for orders placed during the hospital encounter of 06/30/11 (from the past 48 hour(s))  URINE RAPID DRUG SCREEN (HOSP PERFORMED)     Status: Abnormal   Collection Time   06/30/11 10:54 AM      Component Value Range Comment   Opiates NONE  DETECTED  NONE DETECTED     Cocaine NONE DETECTED  NONE DETECTED     Benzodiazepines NONE DETECTED  NONE DETECTED     Amphetamines NONE DETECTED  NONE DETECTED     Tetrahydrocannabinol POSITIVE (*) NONE DETECTED     Barbiturates NONE DETECTED  NONE DETECTED    CBC     Status: Abnormal   Collection Time   06/30/11 10:55 AM      Component Value Range Comment   WBC 7.2  4.0 - 10.5 (K/uL)    RBC 5.68  4.22 - 5.81 (MIL/uL)    Hemoglobin 17.8 (*) 13.0 - 17.0 (g/dL)    HCT 09.8  11.9 - 14.7 (%)    MCV 88.0  78.0 - 100.0 (fL)    MCH 31.3  26.0 - 34.0 (pg)    MCHC 35.6  30.0 - 36.0 (g/dL)    RDW 82.9  56.2 - 13.0 (%)    Platelets 344  150 - 400 (K/uL)   COMPREHENSIVE METABOLIC PANEL     Status: Abnormal   Collection Time   06/30/11 10:55 AM      Component Value Range Comment   Sodium 139  135 - 145 (mEq/L)    Potassium 2.7 (*) 3.5 - 5.1 (mEq/L)    Chloride 92 (*) 96 - 112 (mEq/L)    CO2 31  19 - 32 (mEq/L)    Glucose, Bld 123 (*) 70 - 99 (mg/dL)    BUN 22  6 - 23 (mg/dL)    Creatinine, Ser 8.65  0.50 - 1.35 (mg/dL)    Calcium 9.3  8.4 - 10.5 (mg/dL)    Total Protein 7.6  6.0 - 8.3 (g/dL)    Albumin 4.4  3.5 - 5.2 (g/dL)    AST 36  0 - 37 (U/L)    ALT 35  0 - 53 (U/L)    Alkaline Phosphatase 102  39 - 117 (U/L)    Total Bilirubin 0.9  0.3 - 1.2 (mg/dL)    GFR calc non Af Amer >90  >90 (mL/min)    GFR calc Af Amer >90  >90 (mL/min)   ETHANOL     Status: Abnormal   Collection Time   06/30/11 10:55 AM      Component Value Range Comment   Alcohol, Ethyl (B) 211 (*) 0 - 11 (mg/dL)   MAGNESIUM     Status: Normal   Collection Time   06/30/11 10:55 AM      Component Value Range Comment   Magnesium 2.4  1.5 - 2.5 (mg/dL)   PHOSPHORUS     Status: Normal   Collection Time   06/30/11 10:55 AM      Component Value Range Comment   Phosphorus 2.6  2.3 - 4.6 (mg/dL)   POCT I-STAT, CHEM 8     Status: Abnormal   Collection Time   06/30/11 11:09 AM      Component Value Range Comment    Sodium 139  135 - 145 (mEq/L)    Potassium 2.7 (*) 3.5 - 5.1 (mEq/L)    Chloride 94 (*) 96 - 112 (mEq/L)  BUN 23  6 - 23 (mg/dL)    Creatinine, Ser 9.14  0.50 - 1.35 (mg/dL)    Glucose, Bld 782 (*) 70 - 99 (mg/dL)    Calcium, Ion 9.56 (*) 1.12 - 1.32 (mmol/L)    TCO2 33  0 - 100 (mmol/L)    Hemoglobin 18.4 (*) 13.0 - 17.0 (g/dL)    HCT 21.3 (*) 08.6 - 52.0 (%)    Comment NOTIFIED PHYSICIAN       Radiological Exams on Admission: No results found.  Assessment/Plan Principal Problem:  *Acute gastroenteritis: Patient presents with 3-4 days of vomiting, diarrhea and periumbilical pain, consistent with acute gastroenteritis. Etiology is unclear, but patient has no history of ingestion of unusual foods or clustering of cases. Alcohol, may indeed, be the culprit. We shall manage with bowel rest, intravenous fluid hydration proton pump inhibitors, and  for completeness, do stool studies for C. Difficile PCR, as well as microscopy. We shall  also check lipase levels to rule out acute pancreatitis. Active Problems:  1. Chronic alcohol abuse: The patient has a long history of chronic alcohol abuse. We shall counsel appropriately, and manage with vitamin supplements.  2.  Acute alcoholic intoxication: The patient is an acutely intoxicated and given alcohol level of 211. Urine drug screen is positive only for THC. Given his high risk for alcohol withdrawal phenomena and his known history of previous alcohol-related seizures, we shall place patient on CIWA protocol. 3. Dehydration: Patient is mildly dehydrated, due to acute gastroenteritis, and will be managed with iv fluids as described above.. 4.  Active smoker: The patient smokes about one to one half pack of cigarettes per day, has been counseled appropriately and will be managed with  NicoDerm CQ patch during this hospitalization.  5. Alcohol dependence with withdrawal: At the present time, patient does not appear to have overt symptoms of alcohol  withdrawal phenomena in particular, and his so-called "seizure" is doubtful given the fact that his last alcohol ingestion was at 3:00 am. Ativan has already been administered in ED. We shall continue to monitor closely.   Further management will depend on clinical course.   Time Spent on Admission: 1 hour.  Wlliam Grosso,CHRISTOPHER 06/30/2011, 3:39 PM

## 2011-07-01 LAB — COMPREHENSIVE METABOLIC PANEL
ALT: 31 U/L (ref 0–53)
AST: 37 U/L (ref 0–37)
Albumin: 3.5 g/dL (ref 3.5–5.2)
Alkaline Phosphatase: 77 U/L (ref 39–117)
BUN: 21 mg/dL (ref 6–23)
CO2: 27 mEq/L (ref 19–32)
Calcium: 8 mg/dL — ABNORMAL LOW (ref 8.4–10.5)
Chloride: 101 mEq/L (ref 96–112)
Creatinine, Ser: 0.84 mg/dL (ref 0.50–1.35)
GFR calc Af Amer: >90 mL/min (ref >90)
GFR calc non Af Amer: >90 mL/min (ref >90)
Glucose, Bld: 88 mg/dL (ref 70–99)
Potassium: 3.7 mEq/L (ref 3.5–5.1)
Sodium: 137 mEq/L (ref 135–145)
Total Bilirubin: 0.9 mg/dL (ref 0.3–1.2)
Total Protein: 5.9 g/dL — ABNORMAL LOW (ref 6.0–8.3)

## 2011-07-01 LAB — CBC
HCT: 41.2 % (ref 39.0–52.0)
Hemoglobin: 14 g/dL (ref 13.0–17.0)
MCH: 30.6 pg (ref 26.0–34.0)
MCHC: 34 g/dL (ref 30.0–36.0)
MCV: 90.2 fL (ref 78.0–100.0)
Platelets: 267 10*3/uL (ref 150–400)
RBC: 4.57 MIL/uL (ref 4.22–5.81)
RDW: 12.7 % (ref 11.5–15.5)
WBC: 7.9 10*3/uL (ref 4.0–10.5)

## 2011-07-01 LAB — LIPASE, BLOOD: Lipase: 27 U/L (ref 11–59)

## 2011-07-01 LAB — CLOSTRIDIUM DIFFICILE BY PCR: Toxigenic C. Difficile by PCR: NEGATIVE

## 2011-07-01 MED ORDER — OLANZAPINE 10 MG PO TABS
10.0000 mg | ORAL_TABLET | Freq: Every day | ORAL | Status: DC
  Administered 2011-07-01 – 2011-07-03 (×3): 10 mg via ORAL
  Filled 2011-07-01 (×4): qty 1

## 2011-07-01 MED ORDER — SODIUM CHLORIDE 0.9 % IV SOLN
INTRAVENOUS | Status: DC
  Administered 2011-07-02 – 2011-07-03 (×4): via INTRAVENOUS

## 2011-07-01 MED ORDER — VITAMIN B-1 100 MG PO TABS
100.0000 mg | ORAL_TABLET | Freq: Every day | ORAL | Status: DC
  Administered 2011-07-01 – 2011-07-16 (×16): 100 mg via ORAL
  Filled 2011-07-01 (×16): qty 1

## 2011-07-01 MED ORDER — FOLIC ACID 1 MG PO TABS
1.0000 mg | ORAL_TABLET | Freq: Every day | ORAL | Status: DC
  Administered 2011-07-01 – 2011-07-16 (×16): 1 mg via ORAL
  Filled 2011-07-01 (×17): qty 1

## 2011-07-01 MED ORDER — PANTOPRAZOLE SODIUM 40 MG PO TBEC
40.0000 mg | DELAYED_RELEASE_TABLET | Freq: Every day | ORAL | Status: DC
  Administered 2011-07-01 – 2011-07-08 (×8): 40 mg via ORAL
  Filled 2011-07-01 (×10): qty 1

## 2011-07-01 NOTE — Progress Notes (Signed)
Subjective: Feels better this am. Abdominal pain is less, no vomiting overnight. Had 2 loose stools. Per RN, patient has had suicidal thoughts, and now has sitter.  Objective: Vital signs in last 24 hours: Temp:  [97.8 F (36.6 C)-98.7 F (37.1 C)] 97.9 F (36.6 C) (11/13 0710) Pulse Rate:  [75-108] 88  (11/13 0710) Resp:  [18-23] 21  (11/13 0710) BP: (122-157)/(66-97) 123/66 mmHg (11/13 0710) SpO2:  [93 %-98 %] 98 % (11/13 0710) Weight:  [90.719 kg (200 lb)-92.1 kg (203 lb 0.7 oz)] 203 lb 0.7 oz (92.1 kg) (11/12 1815) Weight change:  Last BM Date: 07/01/11  Intake/Output from previous day: 11/12 0701 - 11/13 0700 In: 3467.5 [P.O.:960; I.V.:2497.5; IV Piggyback:10] Out: 1283 [Urine:1280; Stool:3]     Physical Exam: General: Patient does not appear to be in obvious acute distress. Alert, communicative, fully oriented, talking in complete sentences, not short of breath at rest.  HEENT: No clinical pallor, no jaundice, no conjunctival injection or discharge. Hydration is fair.  NECK: Supple, JVP not seen, no carotid bruits, no palpable lymphadenopathy, no palpable goiter.  CHEST: Clinically clear to auscultation, no wheezes, no crackles.  HEART: Sounds 1 and 2 heard, normal, regular, no murmurs.  ABDOMEN: Full, soft, only mild to moderate periumbilical discomfort, no palpable organomegaly, no palpable masses, normal bowel sounds.  LOWER EXTREMITIES: No pitting edema, palpable peripheral pulses.  MUSCULOSKELETAL SYSTEM: Generalized osteoarthritic changes, otherwise, normal.  CENTRAL NERVOUS SYSTEM: No focal neurologic deficit on gross examination.     Lab Results:  Basename 07/01/11 0313 06/30/11 1109 06/30/11 1055  WBC 7.9 -- 7.2  HGB 14.0 18.4* --  HCT 41.2 54.0* --  PLT 267 -- 344    Basename 07/01/11 0313 06/30/11 1109 06/30/11 1055  NA 137 139 --  K 3.7 2.7* --  CL 101 94* --  CO2 27 -- 31  GLUCOSE 88 126* --  BUN 21 23 --  CREATININE 0.84 1.20 --  CALCIUM 8.0*  -- 9.3   Recent Results (from the past 240 hour(s))  MRSA PCR SCREENING     Status: Normal   Collection Time   06/30/11  7:09 PM      Component Value Range Status Comment   MRSA by PCR NEGATIVE  NEGATIVE  Final      Studies/Results: No results found.  Medications: Scheduled Meds:   . acetaminophen  650 mg Oral Once  . FLUoxetine  60 mg Oral Daily  . folic acid  1 mg Intravenous Daily  . heparin  5,000 Units Subcutaneous Q8H  . LORazepam  0-4 mg Intravenous Q6H   Followed by  . LORazepam  0-4 mg Intravenous Q12H  . LORazepam  1 mg Intravenous Once  . nicotine  21 mg Transdermal Daily  . OLANZapine  5 mg Oral QHS  . ondansetron  4 mg Intravenous Once  . pantoprazole (PROTONIX) IV  40 mg Intravenous Q24H  . potassium chloride  10 mEq Intravenous Once  . potassium chloride  40 mEq Oral Once  . banana bag IV fluid 1000 mL   Intravenous Once  . sodium chloride  500 mL Intravenous Once  . thiamine  100 mg Intravenous Daily   Continuous Infusions:   . sodium chloride 125 mL/hr (07/01/11 0350)   PRN Meds:.acetaminophen, acetaminophen, HYDROmorphone, LORazepam, LORazepam, ondansetron (ZOFRAN) IV, ondansetron, oxyCODONE  Assessment/Plan:  *Acute gastroenteritis: Etiology is unclear, possibly alcohol-related, and stool studies are still pending, as is lipaset. No vomiting overnight, and tolerating clears. Will advance diet today,  but continue current management, otherwise. Active Problems:  1. Chronic alcohol abuse: The patient has a long history of chronic alcohol abuse. On vitamin supplements.  2. Acute alcoholic intoxication: Resolved. 3. Dehydration: Resolved. Will reduce ivi NS to 100cc/hout, maintenance. 4. Active smoker: The patient smokes about one to one half pack of cigarettes per day. He has been counseled appropriately and is on Nicoderm CQ.  5. Alcohol dependence with withdrawal: Only very mild tremor. Continue CIWA protocol. 6. Suicidal ideation: Will request   Psychiatry consultation.  Comment: Patient inadvertently admitted to SDU. Will transfer to general medical floor.   LOS: 1 day   Christy Friede,CHRISTOPHER 07/01/2011, 8:15 AM

## 2011-07-02 DIAGNOSIS — E876 Hypokalemia: Secondary | ICD-10-CM

## 2011-07-02 LAB — CBC
HCT: 40.1 % (ref 39.0–52.0)
Hemoglobin: 13.5 g/dL (ref 13.0–17.0)
MCH: 30.3 pg (ref 26.0–34.0)
MCHC: 33.7 g/dL (ref 30.0–36.0)
MCV: 90.1 fL (ref 78.0–100.0)
Platelets: 217 10*3/uL (ref 150–400)
RBC: 4.45 MIL/uL (ref 4.22–5.81)
RDW: 12.4 % (ref 11.5–15.5)
WBC: 6.5 10*3/uL (ref 4.0–10.5)

## 2011-07-02 LAB — COMPREHENSIVE METABOLIC PANEL
ALT: 26 U/L (ref 0–53)
AST: 27 U/L (ref 0–37)
Albumin: 3 g/dL — ABNORMAL LOW (ref 3.5–5.2)
Alkaline Phosphatase: 102 U/L (ref 39–117)
BUN: 16 mg/dL (ref 6–23)
CO2: 27 mEq/L (ref 19–32)
Calcium: 8.4 mg/dL (ref 8.4–10.5)
Chloride: 104 mEq/L (ref 96–112)
Creatinine, Ser: 0.99 mg/dL (ref 0.50–1.35)
GFR calc Af Amer: >90 mL/min (ref >90)
GFR calc non Af Amer: >90 mL/min (ref >90)
Glucose, Bld: 96 mg/dL (ref 70–99)
Potassium: 3.1 mEq/L — ABNORMAL LOW (ref 3.5–5.1)
Sodium: 136 mEq/L (ref 135–145)
Total Bilirubin: 0.6 mg/dL (ref 0.3–1.2)
Total Protein: 5.7 g/dL — ABNORMAL LOW (ref 6.0–8.3)

## 2011-07-02 LAB — LIPASE, BLOOD: Lipase: 46 U/L (ref 11–59)

## 2011-07-02 MED ORDER — POTASSIUM CHLORIDE CRYS ER 20 MEQ PO TBCR
40.0000 meq | EXTENDED_RELEASE_TABLET | Freq: Once | ORAL | Status: AC
  Administered 2011-07-02: 40 meq via ORAL
  Filled 2011-07-02: qty 2

## 2011-07-02 NOTE — Consult Note (Addendum)
Patient Identification:  [name] Date of Evaluation:  07/01/2011   History of Present Illness: 40 year old male, with known history of chronic alcoholism, PTSD, schizoaffective disorder depressive type, status [facility], last one in October 2012, presenting with what appears to have been a "seizure" at approximately 4:00 a.m on 06/30/2011,according to patient over the past 3-4 days had vomiting and diarrhea at least 4-6 episodes. Patient reported depressed mood and have suicidal ideations. Also hears voices telling him to kill self and he is worthless. Patient was at [facility] for one month and was and to [facility]. He was there for 3 weeks. He reported after discharge from the hospital he was noncompliant with his medications and started drinking again. Patient lives by himself has no family support.He was treated with Zyprexa and Prozac at behavior [facility].   Past Medical History:     Past Medical History  Diagnosis Date  . Depression   . Psychosis   . PTSD (post-traumatic stress disorder)   . Seizure disorder     related to etoh seizure       Past Surgical History  Procedure Date  . Foot surgery     right    Allergies: No Known Allergies  Current Medications:  Prior to Admission medications   Medication Sig Start Date End Date Taking? Authorizing Provider  FLUoxetine (PROZAC) 20 MG capsule Take 60 mg by mouth daily. Patient takes (3) 20mg  capsules daily    Yes Historical Provider, MD  OLANZapine (ZYPREXA) 2.5 MG tablet Take 5 mg by mouth at bedtime. Patient states that he is taking 5mg .    Yes Historical Provider, MD  prazosin (MINIPRESS) 2 MG capsule Take 4 mg by mouth at bedtime. Patient takes (2) 4mg  daily    Yes Historical Provider, MD    Social History:  He  reports that he has been smoking.  He has never used smokeless tobacco. He reports that he drinks alcohol 5-6 24 oz cans a day. He reports that he uses illicit  drugs (Marijuana).   Family History:    History reviewed. No pertinent family history.  Mental Status Examination/Evaluation: Objective:  Appearance: Fairly Groomed  Psychomotor Activity:  Decreased  Eye Contact::  Fair  Speech:  Slow  Volume:  Decreased  Mood:  Depressed  Affect:  Restricted  Thought Process:  Patient is redirectable during the interview and is logical does not seem to be intimately preoccupied.  Orientation:  Full  Thought Content:  Patient is having auditory hallucinations but does not seem to be delusional.  Suicidal Thoughts:  Yes.  without intent/plan  Homicidal Thoughts:  No  Judgement:  Impaired  Insight:  Shallow    Assessment:   AXIS I Schizoaffective disorder, chronic alcohol dependence, PTSD   AXIS II  Deffered  AXIS III See medical notes.  AXIS IV Alcohol abuse, psychosocial stressors present.  AXIS V 31-40 impairment in reality testing     Recommendations: 1 As patient is having suicidal and psychotic symptoms patient need inpatient psych stabilization ,patient should be transferred to see [facility]. 2. Continue Prozac 60 mg p.o. Daily along with Zyprexa 10 mg p.o. Daily at bedtime 3. I will follow on this patient as needed. 4.I will also speak with Dr. Allena Katz as he was his patient at the [facility] last time.    Eulogio Ditch, MD 07/01/2011 7:00 PM

## 2011-07-02 NOTE — Plan of Care (Signed)
Problem: Phase I Progression Outcomes Goal: Other Phase I Outcomes/Goals                  

## 2011-07-02 NOTE — Progress Notes (Signed)
[facility] Inpatient Progress Note  07/02/2011  Subjective: The patient is reporting that he is feeling better today than he felt yesterday.  He would like to get better.  He reports that inpatient psychiatric treatment he is agreeable to that.  He reports that his symptoms overall have not changed significantly.  He reports that he has taken Zyprexa in the past and received better in provement symptoms when he took Seroquel.  The patient says that he has been to the [facility] facility in the past.  A sitter is currently in the room with the patient at this time.  Objective:  Vital signs in last 24 hours: Filed Vitals:   07/01/11 1808 07/01/11 2100 07/02/11 0500 07/02/11 1100  BP: 154/93 129/76 133/79 138/82  Pulse: 81 69 67 93  Temp: 98.8 F (37.1 C) 98.6 F (37 C) 97.3 F (36.3 C) 97.6 F (36.4 C)  TempSrc: Oral Oral Oral   Resp: 19 18 16 16   Height:      Weight:      SpO2: 96% 96% 97% 98%   Weight change:   Intake/Output Summary (Last 24 hours) at 07/02/11 1553 Last data filed at 07/02/11 0919  Gross per 24 hour  Intake   1680 ml  Output      0 ml  Net   1680 ml    Review of Systems Pertinent items are noted in HPI. otherwise all reviewed and reported as negative  Physical Exam BP 138/82  Pulse 93  Temp(Src) 97.6 F (36.4 C) (Oral)  Resp 16  Ht 6' (1.829 m)  Wt 92.1 kg (203 lb 0.7 oz)  BMI 27.54 kg/m2  SpO2 98%  General Appearance:    Alert, cooperative, no distress, appears stated age  Head:    Normocephalic, without obvious abnormality, atraumatic  Eyes:    PERRL, conjunctiva/corneas clear, EOM's intact, fundi    benign, both eyes   Ears:    Normal TM's and external ear canals, both ears  Nose:   Nares normal, septum midline, mucosa normal, no drainage    or sinus tenderness  Throat:   Lips, mucosa, and tongue normal; teeth and gums normal  Neck:   Supple, symmetrical, trachea midline, no adenopathy;       thyroid:  No enlargement/tenderness/nodules; no  carotid   bruit or JVD  Back:     Symmetric, no curvature, ROM normal, no CVA tenderness  Lungs:     Clear to auscultation bilaterally, respirations unlabored  Chest wall:    No tenderness or deformity  Heart:    Regular rate and rhythm, S1 and S2 normal, no murmur, rub   or gallop  Abdomen:     Soft, non-tender, bowel sounds active all four quadrants,    no masses, no organomegaly        Extremities:   Extremities normal, atraumatic, no cyanosis or edema  Pulses:   2+ and symmetric all extremities  Skin:   Skin color, texture, turgor normal, no rashes or lesions  Lymph nodes:   Cervical, supraclavicular, and axillary nodes normal  Neurologic:   CNII-XII intact. Normal strength, sensation and reflexes      Throughout--Psychiatric exam-flat affect noted, no pressured speech     Lab Results:  Micro Results: Recent Results (from the past 240 hour(s))  MRSA PCR SCREENING     Status: Normal   Collection Time   06/30/11  7:09 PM      Component Value Range Status Comment   MRSA by  PCR NEGATIVE  NEGATIVE  Final   CLOSTRIDIUM DIFFICILE BY PCR     Status: Normal   Collection Time   06/30/11 11:29 PM      Component Value Range Status Comment   C difficile by pcr NEGATIVE  NEGATIVE  Final     Studies/Results: No results found.  Medications:  Scheduled Meds:   . FLUoxetine  60 mg Oral Daily  . folic acid  1 mg Oral Daily  . heparin  5,000 Units Subcutaneous Q8H  . LORazepam  0-4 mg Intravenous Q6H   Followed by  . LORazepam  0-4 mg Intravenous Q12H  . nicotine  21 mg Transdermal Daily  . OLANZapine  10 mg Oral QHS  . pantoprazole  40 mg Oral Q1200  . thiamine  100 mg Oral Daily  . DISCONTD: OLANZapine  5 mg Oral QHS   Continuous Infusions:   . sodium chloride 100 mL/hr at 07/02/11 1117   PRN Meds:.acetaminophen, acetaminophen, HYDROmorphone, LORazepam, LORazepam, ondansetron (ZOFRAN) IV, ondansetron, oxyCODONE  Assessment/Plan: Acute gastroenteritis (06/30/2011)    Assessment: Symptoms improving with IV fluids and rest   Chronic alcohol abuse (06/30/2011)  Acute alcoholic intoxication (06/30/2011)   Continue withdrawal protocol    Dehydration (06/30/2011)  improved with IV fluids   Active smoker (06/30/2011)   encouraged tobacco cessation   Alcohol dependence with withdrawal (06/30/2011)   Suicidal ideation (07/01/2011) The patient will need inpatient psychiatric treatment and will make arrangements for that.  I spoke with the social worker who will talk with the psychiatric social worker and make arrangements for transfer when a bed becomes available.  Please see orders.    LOS: 2 days   Shuntia Exton 07/02/2011, 3:53 PM   Cleora Fleet, MD, CDE, FAAFP [facility] [place], [place]

## 2011-07-03 ENCOUNTER — Encounter (HOSPITAL_COMMUNITY): Payer: Self-pay | Admitting: Family Medicine

## 2011-07-03 DIAGNOSIS — F251 Schizoaffective disorder, depressive type: Secondary | ICD-10-CM | POA: Insufficient documentation

## 2011-07-03 LAB — BASIC METABOLIC PANEL
BUN: 15 mg/dL (ref 6–23)
CO2: 25 mEq/L (ref 19–32)
Calcium: 8.6 mg/dL (ref 8.4–10.5)
Chloride: 104 mEq/L (ref 96–112)
Creatinine, Ser: 0.75 mg/dL (ref 0.50–1.35)
GFR calc Af Amer: >90 mL/min (ref >90)
GFR calc non Af Amer: >90 mL/min (ref >90)
Glucose, Bld: 85 mg/dL (ref 70–99)
Potassium: 3.9 mEq/L (ref 3.5–5.1)
Sodium: 137 mEq/L (ref 135–145)

## 2011-07-03 NOTE — Plan of Care (Signed)
Problem: Phase II Progression Outcomes Goal: Other Phase II Outcomes/Goals                  

## 2011-07-03 NOTE — Progress Notes (Signed)
[facility] Inpatient Progress Note  07/03/2011  Subjective: Pt reports that he is sweating.  He is resting in bed and was sleeping when I came into room.  Pt says that he is eating well and he is not having any GI symptoms at this time.  He is willing to go to inpatient psych treatment for stabilization and says "I want to get better."   Objective:  Vital signs in last 24 hours: Filed Vitals:   07/02/11 1100 07/02/11 1700 07/02/11 2110 07/03/11 0500  BP: 138/82 132/80 130/76 144/93  Pulse: 93 70 74 61  Temp: 97.6 F (36.4 C) 97.7 F (36.5 C) 97.9 F (36.6 C) 98 F (36.7 C)  TempSrc:   Oral Oral  Resp: 16 18 18 18   Height:      Weight:      SpO2: 98% 94% 97% 98%   Weight change:   Intake/Output Summary (Last 24 hours) at 07/03/11 0858 Last data filed at 07/03/11 0815  Gross per 24 hour  Intake   1080 ml  Output      0 ml  Net   1080 ml    Review of Systems A comprehensive review of systems was negative except for: Behavioral/Psych: positive for bad mood, behavior problems, bipolar, depression and excessive alcohol consumption  Physical Exam General appearance: cooperative, appears stated age and no distress Head: Normocephalic, without obvious abnormality, atraumatic Eyes: conjunctivae/corneas clear. PERRL, EOM's intact. Fundi benign. Nose: Nares normal. Septum midline. Mucosa normal. No drainage or sinus tenderness. Throat: lips, mucosa, and tongue normal; teeth and gums normal and poor dentition Lungs: clear to auscultation bilaterally Heart: regular rate and rhythm, S1, S2 normal, no murmur, click, rub or gallop Abdomen: soft, non-tender; bowel sounds normal; no masses,  no organomegaly Extremities: extremities normal, atraumatic, no cyanosis or edema Skin: Skin color, texture, turgor normal. No rashes or lesions or clammy wet skin Neurologic: Alert and oriented X 3, normal strength and tone. Normal symmetric reflexes. Normal coordination and gait  Lab  Results:  Micro Results: Recent Results (from the past 240 hour(s))  MRSA PCR SCREENING     Status: Normal   Collection Time   06/30/11  7:09 PM      Component Value Range Status Comment   MRSA by PCR NEGATIVE  NEGATIVE  Final   CLOSTRIDIUM DIFFICILE BY PCR     Status: Normal   Collection Time   06/30/11 11:29 PM      Component Value Range Status Comment   C difficile by pcr NEGATIVE  NEGATIVE  Final     Studies/Results: No results found.  Medications:  Scheduled Meds:   . FLUoxetine  60 mg Oral Daily  . folic acid  1 mg Oral Daily  . heparin  5,000 Units Subcutaneous Q8H  . LORazepam  0-4 mg Intravenous Q6H   Followed by  . LORazepam  0-4 mg Intravenous Q12H  . nicotine  21 mg Transdermal Daily  . OLANZapine  10 mg Oral QHS  . pantoprazole  40 mg Oral Q1200  . potassium chloride  40 mEq Oral Once  . thiamine  100 mg Oral Daily   Continuous Infusions:   . sodium chloride 100 mL/hr at 07/03/11 0747   PRN Meds:.acetaminophen, acetaminophen, HYDROmorphone, LORazepam, LORazepam, ondansetron (ZOFRAN) IV, ondansetron, oxyCODONE  Assessment/Plan:  Acute gastroenteritis: symptoms have completely resolved now. Pt is eating well and tolerating regular diet 1. Chronic alcohol abuse: The patient has a long history of chronic alcohol abuse. On vitamin  supplements. Ativan PRN; sitter present in room 2. Acute alcoholic intoxication: Ativan PRN 3. Dehydration: Resolved. Saline lock IV   4. Active smoker: The patient smokes about one to one half pack of cigarettes per day. He has been counseled appropriately and is on Nicoderm CQ.  5. Alcohol dependence with withdrawal:  Continue CIWA protocol.   6. Suicidal ideation: Per Psychiatry consultation, pt will need inpatient psych stabilization at [facility], have notified the CSW for assistance with placement. Sitter present in room with patient at all times.    LOS: 3 days   Clanford Johnson 07/03/2011, 8:58 AM   Cleora Fleet, MD,  CDE, FAAFP [facility] [place], [place]

## 2011-07-04 MED ORDER — LORAZEPAM 2 MG/ML IJ SOLN
2.0000 mg | INTRAMUSCULAR | Status: DC | PRN
  Filled 2011-07-04: qty 1

## 2011-07-04 MED ORDER — LORAZEPAM 1 MG PO TABS
2.0000 mg | ORAL_TABLET | ORAL | Status: DC | PRN
  Administered 2011-07-04 – 2011-07-16 (×60): 2 mg via ORAL
  Filled 2011-07-04 (×21): qty 2
  Filled 2011-07-04: qty 1
  Filled 2011-07-04 (×13): qty 2
  Filled 2011-07-04: qty 1
  Filled 2011-07-04 (×11): qty 2
  Filled 2011-07-04: qty 1
  Filled 2011-07-04 (×4): qty 2
  Filled 2011-07-04: qty 1
  Filled 2011-07-04 (×9): qty 2

## 2011-07-04 MED ORDER — QUETIAPINE FUMARATE 200 MG PO TABS
200.0000 mg | ORAL_TABLET | Freq: Every day | ORAL | Status: DC
Start: 2011-07-04 — End: 2011-07-16
  Administered 2011-07-04 – 2011-07-16 (×13): 200 mg via ORAL
  Filled 2011-07-04 (×13): qty 1

## 2011-07-04 MED ORDER — QUETIAPINE FUMARATE 300 MG PO TABS
600.0000 mg | ORAL_TABLET | Freq: Every day | ORAL | Status: DC
  Administered 2011-07-05 – 2011-07-15 (×12): 600 mg via ORAL
  Filled 2011-07-04 (×15): qty 2

## 2011-07-04 NOTE — Progress Notes (Signed)
[facility] Inpatient Progress Note  07/04/2011  Subjective: Pt is resting comfortably and watching television today.  Pt is requesting Klonipin 3 x daily for anxiety.   Objective:  Vital signs in last 24 hours: Filed Vitals:   07/03/11 0500 07/03/11 1744 07/03/11 2054 07/04/11 0539  BP: 144/93 134/90 124/77 129/83  Pulse: 61 73 88 66  Temp: 98 F (36.7 C) 97.4 F (36.3 C) 97.5 F (36.4 C) 97.9 F (36.6 C)  TempSrc: Oral Oral Oral Oral  Resp: 18 18 18 20   Height:      Weight:  95 kg (209 lb 7 oz)    SpO2: 98% 97% 98% 95%   Weight change:   Intake/Output Summary (Last 24 hours) at 07/04/11 1022 Last data filed at 07/03/11 1200  Gross per 24 hour  Intake    240 ml  Output      0 ml  Net    240 ml    Review of Systems A comprehensive review of systems was negative except for: Behavioral/Psych: positive for bad mood, behavior problems, bipolar, depression and excessive alcohol consumption   Physical Exam General appearance: cooperative, appears stated age and no distress  Head: Normocephalic, without obvious abnormality, atraumatic  Eyes: conjunctivae/corneas clear. PERRL, EOM's intact. Fundi benign.  Nose: Nares normal. Septum midline. Mucosa normal. No drainage or sinus tenderness.  Throat: lips, mucosa, and tongue normal; teeth and gums normal and poor dentition  Lungs: clear to auscultation bilaterally  Heart: regular rate and rhythm, S1, S2 normal, no murmur, click, rub or gallop  Abdomen: soft, non-tender; bowel sounds normal; no masses, no organomegaly  Extremities: extremities normal, atraumatic, no cyanosis or edema  Skin: Skin color, texture, turgor normal. No rashes or lesions or clammy wet skin  Neurologic: Alert and oriented X 3, normal strength and tone. Normal symmetric reflexes. Normal coordination and gait  Lab Results: No results found for this or any previous visit (from the past 24 hour(s)).  Micro Results: Recent Results (from the past  240 hour(s))  MRSA PCR SCREENING     Status: Normal   Collection Time   06/30/11  7:09 PM      Component Value Range Status Comment   MRSA by PCR NEGATIVE  NEGATIVE  Final   CLOSTRIDIUM DIFFICILE BY PCR     Status: Normal   Collection Time   06/30/11 11:29 PM      Component Value Range Status Comment   C difficile by pcr NEGATIVE  NEGATIVE  Final     Studies/Results: No results found.  Medications:  Scheduled Meds:   . FLUoxetine  60 mg Oral Daily  . folic acid  1 mg Oral Daily  . heparin  5,000 Units Subcutaneous Q8H  . LORazepam  0-4 mg Intravenous Q12H  . nicotine  21 mg Transdermal Daily  . OLANZapine  10 mg Oral QHS  . pantoprazole  40 mg Oral Q1200  . thiamine  100 mg Oral Daily   Continuous Infusions:  PRN Meds:.acetaminophen, acetaminophen, HYDROmorphone, LORazepam, LORazepam, ondansetron (ZOFRAN) IV, ondansetron, oxyCODONE  Assessment/Plan: Acute gastroenteritis: symptoms have completely resolved now. Pt is eating well and tolerating regular diet  1. Chronic alcohol abuse: The patient has a long history of chronic alcohol abuse. On vitamin supplements. Ativan PRN; sitter present in room  2. Acute alcoholic intoxication: Ativan PRN  3. Dehydration: Resolved. Saline lock IV  4. Active smoker: The patient smokes about one to one half pack of cigarettes per day. He has been  counseled appropriately and is on Nicoderm CQ.  5. Alcohol dependence with withdrawal: Continue CIWA protocol.  6. Suicidal ideation: Per Psychiatry consultation, pt will need inpatient psych stabilization at [facility], have notified the CSW for assistance with placement. Sitter present in room with patient at all times. Awaiting bed offer from [facility]. Pt is medically stable for transfer to inpatient psych facility at any time.  7. Hypokalemia (07/02/2011) - treated 8. Schizoaffective disorder - Continue current psychiatric medications.  Use ativan prn anxiety.  See orders.        LOS: 4 days   Draedyn Weidinger 07/04/2011, 10:22 AM   Cleora Fleet, MD, CDE, FAAFP [facility] [place], [place]

## 2011-07-04 NOTE — Progress Notes (Signed)
CSW started referral to [facility].  Awaiting acceptance/bed availability.  Will continue to follow to assist with d/c planning.

## 2011-07-04 NOTE — Progress Notes (Signed)
  [name] is a 40 y.o. male [id number] 1971-01-03  Subjective/Objective: Patient is still feeling depressed and having suicidal ideations without a specific plan also reported still hearing voices but he does not seem to be responding to the voices he can make a good conversation.  I also spoke with the patient's fianc and she told me that patient did well on Seroquel than Zyprexa he was taking Seroquel 600 mg at bedtime and 200 mg in the morning. Patient wants to be started back on Seroquel.    History   Social History  . Marital Status: Single    Spouse Name: N/A    Number of Children: N/A  . Years of Education: N/A   Social History Main Topics  . Smoking status: Current Everyday Smoker -- 1.5 packs/day  . Smokeless tobacco: Never Used  . Alcohol Use: Yes     1 fifth per day  . Drug Use: Yes    Special: Marijuana  . Sexually Active:    Other Topics Concern  . None   Social History Narrative  . None     Lab Results:   BMET    Component Value Date/Time   NA 137 07/03/2011 0515   K 3.9 07/03/2011 0515   CL 104 07/03/2011 0515   CO2 25 07/03/2011 0515   GLUCOSE 85 07/03/2011 0515   BUN 15 07/03/2011 0515   CREATININE 0.75 07/03/2011 0515   CALCIUM 8.6 07/03/2011 0515   GFRNONAA >90 07/03/2011 0515   GFRAA >90 07/03/2011 0515    Medications:  Scheduled:     . FLUoxetine  60 mg Oral Daily  . folic acid  1 mg Oral Daily  . heparin  5,000 Units Subcutaneous Q8H  . LORazepam  0-4 mg Intravenous Q12H  . nicotine  21 mg Transdermal Daily  . OLANZapine  10 mg Oral QHS  . pantoprazole  40 mg Oral Q1200  . thiamine  100 mg Oral Daily     PRN Meds acetaminophen, acetaminophen, LORazepam, LORazepam, LORazepam, ondansetron, oxyCODONE, DISCONTD: HYDROmorphone, DISCONTD: LORazepam, DISCONTD: ondansetron (ZOFRAN) IV   Plan: #1 start the patient on Seroquel 800 mg per day discontinued Zyprexa. #2 sit and be continued at this time. #3 patient should be   Referred to [facility] for further stabilization.     [name] S 07/04/2011

## 2011-07-05 MED ORDER — IBUPROFEN 800 MG PO TABS
ORAL_TABLET | ORAL | Status: AC
  Filled 2011-07-05: qty 1

## 2011-07-05 MED ORDER — IBUPROFEN 600 MG PO TABS
600.0000 mg | ORAL_TABLET | Freq: Four times a day (QID) | ORAL | Status: DC | PRN
  Administered 2011-07-05 – 2011-07-08 (×4): 600 mg via ORAL
  Filled 2011-07-05 (×3): qty 1

## 2011-07-05 NOTE — Progress Notes (Signed)
[facility] Inpatient Progress Note  07/05/2011  Subjective: States he has a headache for the past 3 days. Points to frontal area and states its throbbing. Oxycodone helps.   Objective:  Vital signs in last 24 hours: Filed Vitals:   07/04/11 1411 07/04/11 2205 07/05/11 0636 07/05/11 1425  BP: 119/75 117/70 116/84 124/90  Pulse: 67 79 98 85  Temp: 98 F (36.7 C) 97.4 F (36.3 C) 97.6 F (36.4 C) 97.4 F (36.3 C)  TempSrc: Oral Oral Oral Oral  Resp: 18 18 18 18   Height:      Weight:      SpO2: 94% 95% 97% 94%   Weight change:   Intake/Output Summary (Last 24 hours) at 07/05/11 1549 Last data filed at 07/05/11 0501  Gross per 24 hour  Intake      1 ml  Output      1 ml  Net      0 ml    Review of Systems A comprehensive review of systems was negative except for: Behavioral/Psych: positive for bad mood, behavior problems, bipolar, depression and excessive alcohol consumption   Physical Exam General appearance: cooperative, appears stated age and no distress  Head: Normocephalic, without obvious abnormality, atraumatic  Eyes: conjunctivae/corneas clear. PERRL, EOM's intact. Fundi benign.  Nose: Nares normal. Septum midline. Mucosa normal. No drainage or sinus tenderness.  Throat: lips, mucosa, and tongue normal; teeth and gums normal and poor dentition  Lungs: clear to auscultation bilaterally  Heart: regular rate and rhythm, S1, S2 normal, no murmur, click, rub or gallop  Abdomen: soft, non-tender; bowel sounds normal; no masses, no organomegaly  Extremities: extremities normal, atraumatic, no cyanosis or edema  Skin: Skin color, texture, turgor normal. No rashes or lesions or clammy wet skin  Neurologic: Alert and oriented X 3, normal strength and tone. Normal symmetric reflexes. Normal coordination and gait  Lab Results: No results found for this or any previous visit (from the past 24 hour(s)).  Micro Results: Recent Results (from the past 240 hour(s))    MRSA PCR SCREENING     Status: Normal   Collection Time   06/30/11  7:09 PM      Component Value Range Status Comment   MRSA by PCR NEGATIVE  NEGATIVE  Final   CLOSTRIDIUM DIFFICILE BY PCR     Status: Normal   Collection Time   06/30/11 11:29 PM      Component Value Range Status Comment   C difficile by pcr NEGATIVE  NEGATIVE  Final     Studies/Results: No results found.  Medications:  Scheduled Meds:    . FLUoxetine  60 mg Oral Daily  . folic acid  1 mg Oral Daily  . heparin  5,000 Units Subcutaneous Q8H  . nicotine  21 mg Transdermal Daily  . pantoprazole  40 mg Oral Q1200  . QUEtiapine  200 mg Oral Daily  . QUEtiapine  600 mg Oral QHS  . thiamine  100 mg Oral Daily   Continuous Infusions:  PRN Meds:.acetaminophen, acetaminophen, LORazepam, ondansetron, oxyCODONE  Assessment/Plan: Acute gastroenteritis: symptoms have completely resolved now. Pt is eating well and tolerating regular diet   Chronic alcohol abuse: The patient has a long history of chronic alcohol abuse. On vitamin supplements. Ativan PRN; sitter present in room   Acute alcoholic intoxication: Ativan PRN   Dehydration: Resolved. Saline lock IV   Active smoker: The patient smokes about one to one half pack of cigarettes per day. He has been counseled appropriately  and is on Nicoderm CQ.   Alcohol dependence with withdrawal: Continue CIWA protocol.  Suicidal ideation: Per Psychiatry consultation, pt will need inpatient psych stabilization at [facility], have notified the CSW for assistance with placement. Sitter present in room with patient at all times. Awaiting bed offer from [facility]. Pt is medically stable for transfer to inpatient psych facility at any time.  Hypokalemia (07/02/2011) - treated Schizoaffective disorder - Continue current psychiatric medications.  Use ativan prn anxiety.  See orders. Headache- possible tension related- Ordering Motrin. Would prefer he receive this rather than oxycodone.       Disposition- Awaiting bed [facility]    LOS: 5 days   [facility] 07/05/2011, 3:49 PM   Cleora Fleet, MD, CDE, FAAFP [facility] [place], [place]

## 2011-07-06 NOTE — Progress Notes (Signed)
Subjective: I am grateful for the chance to see and examine [name].  Records reviewed.  Labs reviewed. [name] is a 40 year old man with multiple addictions, posttraumatic stress disorder, bipolar disorder and chronic headache (probable combination of migraine and drug use/withdrawal) He is stable and waiting for transfer to central psychiatric facility.  He does report a headache for a week.  Objective: Vital signs in last 24 hours: Temp:  [97.2 F (36.2 C)-97.7 F (36.5 C)] 97.7 F (36.5 C) (11/18 1327) Pulse Rate:  [85-102] 102  (11/18 1327) Resp:  [18-22] 22  (11/18 1327) BP: (117-125)/(68-90) 125/81 mmHg (11/18 1327) SpO2:  [94 %-97 %] 96 % (11/18 1327) Weight change:  Last BM Date: 07/04/11  Intake/Output from previous day: 11/17 0701 - 11/18 0700 In: 240 [P.O.:240] Out: -  Intake/Output this shift: Total I/O In: 1440 [P.O.:1440] Out: -   Exam reveals well nourished, well mannered man in no distress, walking in room. He has bruising on abdomen at site of heparin injections...yet he is completely mobile.  Lab Results: No results found for this basename: WBC:2,HGB:2,HCT:2,PLT:2 in the last 72 hours BMET No results found for this basename: NA:2,K:2,CL:2,CO2:2,GLUCOSE:2,BUN:2,CREATININE:2,CALCIUM:2 in the last 72 hours  Studies/Results: No results found.  Medications: I have reviewed the patient's current medications.  Assessment/Plan: Patient Active Problem List  Diagnoses  . Chronic alcohol abuse  . Acute alcoholic intoxication  . Acute gastroenteritis  . Dehydration  . Active smoker  . Alcohol dependence with withdrawal  . Suicidal ideation  . Hypokalemia  . Alcohol abuse  . Schizoaffective disorder   I have reordered suicide precautions and suggested he remain on his prn pain meds of ativan and oxycodone for comfort.  I suspect that much of his symptoms are a combination of fluctuations in his substance use. In addition, I have stopped his  heparin.  I see no utility in anticoagulating a young healthy ambulatory man without cancer or surgery while he is waiting for a psych bed.  LOS: 6 days   Ward Roxan Hockey 07/06/2011, 1:45 PM

## 2011-07-07 ENCOUNTER — Inpatient Hospital Stay (HOSPITAL_COMMUNITY): Payer: Self-pay

## 2011-07-07 MED ORDER — POLYETHYLENE GLYCOL 3350 17 G PO PACK
17.0000 g | PACK | Freq: Every day | ORAL | Status: DC
  Administered 2011-07-07 – 2011-07-08 (×2): 17 g via ORAL
  Administered 2011-07-09: 10:00:00 via ORAL
  Filled 2011-07-07 (×3): qty 1

## 2011-07-07 NOTE — Progress Notes (Signed)
Subjective: Complaining of headaches, not getting better. Had some blurry vision last night, which has resolved. Has not had a BM in 3 days.  Objective: Filed Vitals:   07/05/11 2100 07/06/11 1327 07/06/11 2107 07/07/11 0532  BP: 117/68 125/81 117/65 126/77  Pulse: 90 102 77 77  Temp: 97.2 F (36.2 C) 97.7 F (36.5 C) 97.4 F (36.3 C) 97.4 F (36.3 C)  TempSrc: Oral Oral Oral Oral  Resp: 20 22 18 18   Height:      Weight:      SpO2: 97% 96% 96% 95%   Weight change:   Intake/Output Summary (Last 24 hours) at 07/07/11 1309 Last data filed at 07/07/11 1100  Gross per 24 hour  Intake   1800 ml  Output      0 ml  Net   1800 ml    General: Alert, awake, oriented x3, in no acute distress.  HEENT: No bruits, no goiter.  Heart: Regular rate and rhythm, without murmurs, rubs, gallops.  Lungs: , bilateral air movement, Clear to auscultation.  Abdomen: Soft, nontender, nondistended, positive bowel sounds.  Neuro: Grossly intact, nonfocal.   Lab Results:  Micro Results: Recent Results (from the past 240 hour(s))  MRSA PCR SCREENING     Status: Normal   Collection Time   06/30/11  7:09 PM      Component Value Range Status Comment   MRSA by PCR NEGATIVE  NEGATIVE  Final   CLOSTRIDIUM DIFFICILE BY PCR     Status: Normal   Collection Time   06/30/11 11:29 PM      Component Value Range Status Comment   C difficile by pcr NEGATIVE  NEGATIVE  Final     Studies/Results: No results found.  Medications: I have reviewed the patient's current medications.   Acute gastroenteritis: symptoms have completely resolved now. Pt is eating well and tolerating regular diet . Relates no BM in 3 days.  Chronic alcohol abuse: The patient has a long history of chronic alcohol abuse. On vitamin supplements. Ativan PRN; sitter present in room.  Acute alcoholic intoxication: Ativan PRN  Dehydration: Resolved. Saline lock IV   Suicidal ideation: Per Psychiatry consultation, pt will need inpatient  psych stabilization at [facility], have notified the CSW for assistance with placement. Sitter present in room with patient at all times. Awaiting bed offer from [facility]. Pt is medically stable for transfer to inpatient psych facility at any time.  Hypokalemia (07/02/2011) - treated. Repeat Bmet in am.  Schizoaffective disorder - Continue current psychiatric medications. Use ativan prn anxiety. See orders.  Headache- possible tension related- vs migraine. Will check CT.  Constipation: I will add miralax patient on opioids. Waiting placement.     LOS: 7 days   Mando Blatz M.D. Pager: [phone] [facility] 07/07/2011, 1:09 PM

## 2011-07-07 NOTE — Progress Notes (Signed)
  [name] is a 40 y.o. male [id number] [birth year]  Subjective/Objective:  Patient is calm cooperative and pleasant on approach. Patient still complaining of depressed mood and hearing voices on and off but  does not seem to be internally preoccupied and can able to make a good conversation. He also complained of headaches. And was having some blury vision last night. No EPS present. I will continue Seroquel 800 mg by mouth per day and Prozac 60 mg by mouth daily. Patient feels safe in the hospital I will discontinue one to one at this time. [facility]W can continue to work on that.  Filed Vitals:   07/07/11 0532  BP: 126/77  Pulse: 77  Temp: 97.4 F (36.3 C)  Resp: 18    Lab Results:   BMET    Component Value Date/Time   NA 137 07/03/2011 0515   K 3.9 07/03/2011 0515   CL 104 07/03/2011 0515   CO2 25 07/03/2011 0515   GLUCOSE 85 07/03/2011 0515   BUN 15 07/03/2011 0515   CREATININE 0.75 07/03/2011 0515   CALCIUM 8.6 07/03/2011 0515   GFRNONAA >90 07/03/2011 0515   GFRAA >90 07/03/2011 0515    Medications:  Scheduled:     . FLUoxetine  60 mg Oral Daily  . folic acid  1 mg Oral Daily  . nicotine  21 mg Transdermal Daily  . pantoprazole  40 mg Oral Q1200  . polyethylene glycol  17 g Oral Daily  . QUEtiapine  200 mg Oral Daily  . QUEtiapine  600 mg Oral QHS  . thiamine  100 mg Oral Daily     PRN Meds acetaminophen, acetaminophen, ibuprofen, LORazepam, ondansetron, oxyCODONE   AHLUWALIA,SHAMSHER S MD 07/07/2011

## 2011-07-08 LAB — BASIC METABOLIC PANEL
BUN: 19 mg/dL (ref 6–23)
CO2: 27 mEq/L (ref 19–32)
Calcium: 10.2 mg/dL (ref 8.4–10.5)
Chloride: 97 mEq/L (ref 96–112)
Creatinine, Ser: 1.04 mg/dL (ref 0.50–1.35)
GFR calc Af Amer: >90 mL/min (ref >90)
GFR calc non Af Amer: 90 mL/min — ABNORMAL LOW (ref >90)
Glucose, Bld: 87 mg/dL (ref 70–99)
Potassium: 4.3 mEq/L (ref 3.5–5.1)
Sodium: 135 mEq/L (ref 135–145)

## 2011-07-08 MED ORDER — BISACODYL 10 MG RE SUPP: 10.0000 mg | Freq: Every day | RECTAL | Status: DC | PRN

## 2011-07-08 MED ORDER — OXYCODONE HCL 5 MG PO TABS
10.0000 mg | ORAL_TABLET | Freq: Once | ORAL | Status: AC
  Administered 2011-07-08: 10 mg via ORAL
  Filled 2011-07-08: qty 2

## 2011-07-08 NOTE — Progress Notes (Signed)
  [name] is a 40 y.o. male [id number] March 26, 1971  Subjective/Objective:  Patient is still depressed and having suicidal ideations but feels safe in the hospital. He still endorsed auditory hallucinations but but with reduced intensity. No side effects reported from Seroquel and Prozac I will continue the current treatment plan.  Filed Vitals:   07/08/11 1337  BP: 142/83  Pulse: 95  Temp: 97.2 F (36.2 C)  Resp: 18    Lab Results:   BMET    Component Value Date/Time   NA 135 07/08/2011 0534   K 4.3 07/08/2011 0534   CL 97 07/08/2011 0534   CO2 27 07/08/2011 0534   GLUCOSE 87 07/08/2011 0534   BUN 19 07/08/2011 0534   CREATININE 1.04 07/08/2011 0534   CALCIUM 10.2 07/08/2011 0534   GFRNONAA 90* 07/08/2011 0534   GFRAA >90 07/08/2011 0534    Medications:  Scheduled:     . FLUoxetine  60 mg Oral Daily  . folic acid  1 mg Oral Daily  . nicotine  21 mg Transdermal Daily  . oxyCODONE  10 mg Oral Once  . pantoprazole  40 mg Oral Q1200  . polyethylene glycol  17 g Oral Daily  . QUEtiapine  200 mg Oral Daily  . QUEtiapine  600 mg Oral QHS  . thiamine  100 mg Oral Daily     PRN Meds acetaminophen, acetaminophen, ibuprofen, LORazepam, ondansetron, oxyCODONE   AHLUWALIA,SHAMSHER S MD 07/08/2011

## 2011-07-08 NOTE — Progress Notes (Signed)
Subjective: Continues to complain of a frontal headache and wants pain meds increased. No BM in 3-4 days. Hallucinations improving. States Seroquel works well for him.   Objective: Filed Vitals:   07/07/11 1500 07/07/11 2243 07/08/11 0524 07/08/11 1337  BP: 121/80 128/85 122/80 142/83  Pulse: 76 73 84 95  Temp: 97.4 F (36.3 C) 97.4 F (36.3 C) 97.5 F (36.4 C) 97.2 F (36.2 C)  TempSrc: Oral Oral Oral Oral  Resp: 18 18 18 18   Height:      Weight:      SpO2: 99% 96% 95% 93%   Weight change:   Intake/Output Summary (Last 24 hours) at 07/08/11 1837 Last data filed at 07/08/11 1430  Gross per 24 hour  Intake    720 ml  Output      0 ml  Net    720 ml    General: Alert, awake, oriented x3, in no acute distress.  HEENT: No bruits, no goiter.  Heart: Regular rate and rhythm, without murmurs, rubs, gallops.  Lungs: , bilateral air movement, Clear to auscultation.  Abdomen: Soft, nontender, nondistended, positive bowel sounds.  Neuro: Grossly intact, nonfocal.   Lab Results:  Micro Results: Recent Results (from the past 240 hour(s))  MRSA PCR SCREENING     Status: Normal   Collection Time   06/30/11  7:09 PM      Component Value Range Status Comment   MRSA by PCR NEGATIVE  NEGATIVE  Final   CLOSTRIDIUM DIFFICILE BY PCR     Status: Normal   Collection Time   06/30/11 11:29 PM      Component Value Range Status Comment   C difficile by pcr NEGATIVE  NEGATIVE  Final     Studies/Results: Ct Head Wo Contrast  07/07/2011  *RADIOLOGY REPORT*  Clinical Data: Headache and blurry vision.  CT HEAD WITHOUT CONTRAST  Technique:  Contiguous axial images were obtained from the base of the skull through the vertex without contrast.  Comparison: None.  Findings: There is no evidence for acute hemorrhage, hydrocephalus, mass lesion, or abnormal extra-axial fluid collection.  No definite CT evidence for acute infarction.  The visualized paranasal sinuses and mastoid air cells are clear.   IMPRESSION: Normal exam.  Original Report Authenticated By: ERIC A. MANSELL, M.D.    Medications: I have reviewed the patient's current medications.   Acute gastroenteritis: symptoms have completely resolved now. Pt is eating well and tolerating regular diet . Relates no BM in 3 days.  Chronic alcohol abuse: The patient has a long history of chronic alcohol abuse. On vitamin supplements. Ativan PRN; sitter present in room.  Acute alcoholic intoxication: Ativan PRN  Dehydration: Resolved. Saline lock IV   Suicidal ideation: Per Psychiatry consultation, pt will need inpatient psych stabilization at [facility], have notified the CSW for assistance with placement. Sitter present in room with patient at all times. Awaiting bed offer from [facility]. Pt is medically stable for transfer to inpatient psych facility at any time.  Hypokalemia (07/02/2011) - treated. Repeat Bmet in am.  Schizoaffective disorder - Continue current psychiatric medications. Use ativan prn anxiety. See orders.  Headache- possible tension related- CT head found to be normal. Will add Motrin.   Constipation: On miralax - add PO dulcolax.   Waiting placement.     LOS: 8 days   [facility] M.D. Pager: [phone] [facility] 07/08/2011, 6:37 PM

## 2011-07-09 MED ORDER — BUTALBITAL-APAP-CAFFEINE 50-325-40 MG PO TABS
1.0000 | ORAL_TABLET | ORAL | Status: DC | PRN
  Administered 2011-07-09 – 2011-07-16 (×37): 1 via ORAL
  Filled 2011-07-09 (×37): qty 1

## 2011-07-09 MED ORDER — POLYETHYLENE GLYCOL 3350 17 G PO PACK
17.0000 g | PACK | Freq: Two times a day (BID) | ORAL | Status: DC
  Administered 2011-07-09 – 2011-07-13 (×6): 17 g via ORAL
  Administered 2011-07-14: 22:00:00 via ORAL
  Administered 2011-07-14 – 2011-07-15 (×2): 17 g via ORAL
  Administered 2011-07-15: 23:00:00 via ORAL
  Administered 2011-07-16: 17 g via ORAL
  Filled 2011-07-09 (×15): qty 1

## 2011-07-09 MED ORDER — OXYCODONE HCL 5 MG PO TABS: 5.0000 mg | ORAL_TABLET | ORAL | Status: DC | PRN

## 2011-07-09 MED ORDER — TRAMADOL HCL 50 MG PO TABS
50.0000 mg | ORAL_TABLET | Freq: Four times a day (QID) | ORAL | Status: DC | PRN
  Administered 2011-07-12 – 2011-07-14 (×12): 50 mg via ORAL
  Filled 2011-07-09 (×14): qty 1

## 2011-07-09 MED ORDER — IBUPROFEN 800 MG PO TABS: 400.0000 mg | ORAL_TABLET | Freq: Four times a day (QID) | ORAL | Status: DC | PRN

## 2011-07-09 MED ORDER — TRAMADOL HCL 50 MG PO TABS: 50.0000 mg | ORAL_TABLET | Freq: Four times a day (QID) | ORAL | Status: DC | PRN

## 2011-07-09 NOTE — Progress Notes (Signed)
  [name] is a 40 y.o. male [id number] 11/08/1970  Subjective/Objective:  Patient is calm cooperative and pleasant on approach. He reported decreased in the intensity of the voices but the voices are still telling him to hurt himself. Patient is not disorganized is not internally preoccupied but in spite of that he is verbalizing hearing voices. He is also having suicidal ideations. Patient still complains of having headache and asked  for pain medications I would recommend not to give him opioids because of  because of his history of alcohol abuse. Patient is waiting for [facility].   Filed Vitals:   07/09/11 1510  BP: 133/89  Pulse: 84  Temp: 97.5 F (36.4 C)  Resp: 16    Lab Results:   BMET    Component Value Date/Time   NA 135 07/08/2011 0534   K 4.3 07/08/2011 0534   CL 97 07/08/2011 0534   CO2 27 07/08/2011 0534   GLUCOSE 87 07/08/2011 0534   BUN 19 07/08/2011 0534   CREATININE 1.04 07/08/2011 0534   CALCIUM 10.2 07/08/2011 0534   GFRNONAA 90* 07/08/2011 0534   GFRAA >90 07/08/2011 0534    Medications:  Scheduled:     . FLUoxetine  60 mg Oral Daily  . folic acid  1 mg Oral Daily  . nicotine  21 mg Transdermal Daily  . polyethylene glycol  17 g Oral BID  . QUEtiapine  200 mg Oral Daily  . QUEtiapine  600 mg Oral QHS  . thiamine  100 mg Oral Daily  . DISCONTD: pantoprazole  40 mg Oral Q1200  . DISCONTD: polyethylene glycol  17 g Oral Daily     PRN Meds acetaminophen, acetaminophen, bisacodyl, butalbital-acetaminophen-caffeine, ibuprofen, LORazepam, ondansetron, traMADol, DISCONTD: ibuprofen, DISCONTD: oxyCODONE, DISCONTD: oxyCODONE, DISCONTD: traMADol   Sanjna Haskew S MD 07/09/2011

## 2011-07-09 NOTE — Progress Notes (Signed)
CSW faxed pt information to [facility]. Renee Pain returned call to CSW stating that pt would be more appropriate for [facility] services and to fax the information there. Harris did state that if [facility] would not accept pt, call [facility] back to get pt reconsidered through there services. Pt information has been sent to [facility] for review. Oncoming CSW to follow up with referral.

## 2011-07-09 NOTE — Progress Notes (Signed)
Subjective: The patient states that his headache pain is not improving and asks for an escalation in his narcotic pain medication. He does not appear to be in acute distress. He has been up and out of bed and was taking a shower this morning.  Objective: Filed Vitals:   07/07/11 2243 07/08/11 0524 07/08/11 1337 07/09/11 0559  BP: 128/85 122/80 142/83 120/80  Pulse: 73 84 95 85  Temp: 97.4 F (36.3 C) 97.5 F (36.4 C) 97.2 F (36.2 C) 97.4 F (36.3 C)  TempSrc: Oral Oral Oral Oral  Resp: 18 18 18 20   Height:      Weight:      SpO2: 96% 95% 93% 98%   Weight change:   Intake/Output Summary (Last 24 hours) at 07/09/11 1124 Last data filed at 07/09/11 1022  Gross per 24 hour  Intake    960 ml  Output      0 ml  Net    960 ml    General: No acute respiratory distress Lungs: Clear to auscultation bilaterally without wheezes or crackles Cardiovascular: Regular rate and rhythm without murmur gallop or rub normal S1 and S2 Abdomen: Nontender, nondistended, soft, bowel sounds positive, no rebound, no ascites, no appreciable mass Extremities: No significant cyanosis, clubbing, or edema bilateral lower extremities   Micro Results: Recent Results (from the past 240 hour(s))  MRSA PCR SCREENING     Status: Normal   Collection Time   06/30/11  7:09 PM      Component Value Range Status Comment   MRSA by PCR NEGATIVE  NEGATIVE  Final   CLOSTRIDIUM DIFFICILE BY PCR     Status: Normal   Collection Time   06/30/11 11:29 PM      Component Value Range Status Comment   C difficile by pcr NEGATIVE  NEGATIVE  Final     Studies/Results: Ct Head Wo Contrast  07/07/2011  *RADIOLOGY REPORT*  Clinical Data: Headache and blurry vision.  CT HEAD WITHOUT CONTRAST  Technique:  Contiguous axial images were obtained from the base of the skull through the vertex without contrast.  Comparison: None.  Findings: There is no evidence for acute hemorrhage, hydrocephalus, mass lesion, or abnormal  extra-axial fluid collection.  No definite CT evidence for acute infarction.  The visualized paranasal sinuses and mastoid air cells are clear.  IMPRESSION: Normal exam.  Original Report Authenticated By: ERIC A. MANSELL, M.D.    Medications: I have reviewed the patient's current medications.  Impression and plan:  Acute gastroenteritis: Resolved  Chronic alcohol abuse: The patient has a long history of chronic alcohol abuse. On vitamin supplements. Ativan PRN.  Acute alcoholic intoxication: Resolved   Dehydration: Resolved  Suicidal ideation: Per Psychiatry consultation, pt will need inpatient psych stabilization at [facility] notified the CSW for assistance with placement. Awaiting bed offer from [facility]. Pt is medically stable for transfer to inpatient psych facility at any time.   Hypokalemia:  Treated and resolved  Schizoaffective disorder - as per the psychiatry service  Headache: possible tension related-CT head found to be normal-avoid escalation of narcotics due to high risk of addictive behavior  Constipation  Awaiting placement. Remains entirely medically stable.    LOS: 9 days   [facility] T M.D. Pager: [phone] [facility] 07/09/2011, 11:24 AM

## 2011-07-10 NOTE — Progress Notes (Signed)
Subjective: Patient seen and examined ,C/O mild headache  Objective: Vital signs in last 24 hours: Temp:  [97.2 F (36.2 C)-98.3 F (36.8 C)] 97.2 F (36.2 C) (11/22 1436) Pulse Rate:  [78-100] 97  (11/22 1436) Resp:  [18] 18  (11/22 1436) BP: (92-124)/(60-83) 92/60 mmHg (11/22 1436) SpO2:  [95 %-99 %] 95 % (11/22 1436) Weight change:  Last BM Date: 07/04/11  Intake/Output from previous day: 11/21 0701 - 11/22 0700 In: 1560 [P.O.:1560] Out: -  Total I/O In: 240 [P.O.:240] Out: -    Physical Exam: General: Alert, awake, oriented x3, in no acute distress. Heart: Regular rate and rhythm, without murmurs, rubs, gallops. Lungs: Clear to auscultation bilaterally. Abdomen: Soft, nontender, nondistended, positive bowel sounds. Extremities: No clubbing cyanosis or edema with positive pedal pulses.   Lab Results: No results found for this or any previous visit (from the past 24 hour(s)).  Studies/Results: No results found.  Medications:    . FLUoxetine  60 mg Oral Daily  . folic acid  1 mg Oral Daily  . nicotine  21 mg Transdermal Daily  . polyethylene glycol  17 g Oral BID  . QUEtiapine  200 mg Oral Daily  . QUEtiapine  600 mg Oral QHS  . thiamine  100 mg Oral Daily    acetaminophen, acetaminophen, bisacodyl, butalbital-acetaminophen-caffeine, ibuprofen, LORazepam, ondansetron, traMADol     Assessment/Plan: Suicidal ideation: Per Psychiatry consultation. Awaiting bed offer from [facility]. Pt is medically stable for transfer to inpatient psych facility at any time.  Headache: possible tension related-CT head  normal-avoid narcotics . Acute gastroenteritis: Resolved  Chronic alcohol abuse: continue  vitamin supplements. Ativan PRN.  Acute alcoholic intoxication: Resolved  Dehydration: Resolved    LOS: 10 days   Luanne Krzyzanowski 07/10/2011, 6:18 PM

## 2011-07-11 NOTE — Progress Notes (Signed)
  [name] is a 40 y.o. male [id number] 1971/05/15  Subjective/Objective:  Today I discussed with him about his alcohol abuse he told me that he drinks 3-4 big beers every day. Patient started drinking since the age of 50. Psychoeducation given regarding alcohol abuse. Patient still verbalizing that he hears voices but they are under well-controlled on Seroquel and did not telling him to kill self or others at this time. He feels safe in the hospital. Patient is logical and goal-directed during the interview no disorganization present his eye contact is good affect is better. I will discontinue the sitter as patient feels safe in the hospital and he is not having floridly psychotic symptoms. No manic symptoms present.  Filed Vitals:   07/11/11 0654  BP: 110/86  Pulse: 81  Temp: 97.6 F (36.4 C)  Resp: 20    Lab Results:   BMET    Component Value Date/Time   NA 135 07/08/2011 0534   K 4.3 07/08/2011 0534   CL 97 07/08/2011 0534   CO2 27 07/08/2011 0534   GLUCOSE 87 07/08/2011 0534   BUN 19 07/08/2011 0534   CREATININE 1.04 07/08/2011 0534   CALCIUM 10.2 07/08/2011 0534   GFRNONAA 90* 07/08/2011 0534   GFRAA >90 07/08/2011 0534    Medications:  Scheduled:     . FLUoxetine  60 mg Oral Daily  . folic acid  1 mg Oral Daily  . nicotine  21 mg Transdermal Daily  . polyethylene glycol  17 g Oral BID  . QUEtiapine  200 mg Oral Daily  . QUEtiapine  600 mg Oral QHS  . thiamine  100 mg Oral Daily     PRN Meds acetaminophen, acetaminophen, bisacodyl, butalbital-acetaminophen-caffeine, ibuprofen, LORazepam, ondansetron, traMADol   Joandy Burget S MD 07/11/2011

## 2011-07-11 NOTE — Progress Notes (Signed)
Subjective: Patient seen and examined. Complaining of mild frontal headache however does not look in any distress.  Objective: Vital signs in last 24 hours: Temp:  [97.4 F (36.3 C)-97.6 F (36.4 C)] 97.4 F (36.3 C) (11/23 1430) Pulse Rate:  [77-81] 79  (11/23 1430) Resp:  [18-20] 18  (11/23 1430) BP: (110-119)/(74-86) 118/74 mmHg (11/23 1430) SpO2:  [97 %-99 %] 98 % (11/23 1430) Weight change:  Last BM Date: 07/04/11  Intake/Output from previous day: 11/22 0701 - 11/23 0700 In: 240 [P.O.:240] Out: -      Physical Exam: General: Alert, awake, oriented x3, in no acute distress.  Heart: Regular rate and rhythm, without murmurs, rubs, gallops.  Lungs: Clear to auscultation bilaterally.  Abdomen: Soft, nontender, nondistended, positive bowel sounds.  Extremities: No clubbing cyanosis or edema with positive pedal pulses.    Lab Results: No results found for this or any previous visit (from the past 24 hour(s)).  Studies/Results: No results found.  Medications:    . FLUoxetine  60 mg Oral Daily  . folic acid  1 mg Oral Daily  . nicotine  21 mg Transdermal Daily  . polyethylene glycol  17 g Oral BID  . QUEtiapine  200 mg Oral Daily  . QUEtiapine  600 mg Oral QHS  . thiamine  100 mg Oral Daily    acetaminophen, acetaminophen, bisacodyl, butalbital-acetaminophen-caffeine, ibuprofen, LORazepam, ondansetron, traMADol     Assessment/Plan:  Suicidal ideation: Per Psychiatry consultation. Awaiting bed offer from [facility]. Pt is medically stable for transfer to inpatient psych facility at any time.  Headache: possible tension related, continue meds CT head normal- avoid narcotics .  Acute gastroenteritis: Resolved  Chronic alcohol abuse: continue vitamin supplements. Ativan PRN.  Acute alcoholic intoxication: Resolved  Dehydration: Resolved    LOS: 11 days   Daniela Siebers 07/11/2011, 6:44 PM

## 2011-07-12 NOTE — Progress Notes (Signed)
[facility] Inpatient Progress Note  07/12/2011  Subjective: Pt ambulating the halls.  He is in no distress.  He has no complaints today  Objective:  Vital signs in last 24 hours: Filed Vitals:   07/11/11 1430 07/11/11 2210 07/12/11 0517 07/12/11 1404  BP: 118/74 126/77 116/76 98/60  Pulse: 79 79 66 84  Temp: 97.4 F (36.3 C) 97.5 F (36.4 C) 97.6 F (36.4 C) 97.5 F (36.4 C)  TempSrc: Oral Oral Oral Oral  Resp: 18 18 20 18   Height:      Weight:      SpO2: 98% 94% 97% 92%   Weight change:   Intake/Output Summary (Last 24 hours) at 07/12/11 1639 Last data filed at 07/12/11 1212  Gross per 24 hour  Intake    840 ml  Output      0 ml  Net    840 ml    Review of Systems A comprehensive review of systems was negative.  Physical Exam BP 98/60  Pulse 84  Temp(Src) 97.5 F (36.4 C) (Oral)  Resp 18  Ht 6' (1.829 m)  Wt 95 kg (209 lb 7 oz)  BMI 28.40 kg/m2  SpO2 92%  General Appearance:    Alert, cooperative, no distress, appears stated age  Head:    Normocephalic, without obvious abnormality, atraumatic  Eyes:    PERRL, conjunctiva/corneas clear, EOM's intact, fundi    benign, both eyes          Nose:   Nares normal, septum midline, mucosa normal, no drainage    or sinus tenderness  Throat:   Lips, mucosa, and tongue normal; teeth and gums normal  Neck:   Supple, symmetrical, trachea midline, no adenopathy;       thyroid:  No enlargement/tenderness/nodules; no carotid   bruit or JVD     Lungs:     Clear to auscultation bilaterally, respirations unlabored  Chest wall:    No tenderness or deformity  Heart:    Regular rate and rhythm, S1 and S2 normal, no murmur, rub   or gallop  Abdomen:     Soft, obese, non-tender, bowel sounds active all four quadrants,  no masses, no organomegaly        Extremities:   Extremities normal, atraumatic, no cyanosis or edema  Pulses:   2+ and symmetric all extremities  Skin:   Skin color, texture, turgor normal, no rashes  or lesions     Neurologic:   CNII-XII intact. Normal strength, sensation and reflexes      throughout    Lab Results: No results found for this or any previous visit (from the past 48 hour(s)). Micro Results: No results found for this or any previous visit (from the past 240 hour(s)).  Studies/Results: No results found.  Medications:  Scheduled Meds:   . FLUoxetine  60 mg Oral Daily  . folic acid  1 mg Oral Daily  . nicotine  21 mg Transdermal Daily  . polyethylene glycol  17 g Oral BID  . QUEtiapine  200 mg Oral Daily  . QUEtiapine  600 mg Oral QHS  . thiamine  100 mg Oral Daily   Continuous Infusions:  PRN Meds:.acetaminophen, acetaminophen, bisacodyl, butalbital-acetaminophen-caffeine, ibuprofen, LORazepam, ondansetron, traMADol  Assessment/Plan:  Suicidal ideation: Per Psychiatry consultation. Awaiting bed offer from [facility]. Pt is medically stable for transfer to inpatient psych facility at any time.  Headache: possible tension related, continue meds, currently resolved  CT head normal-avoid narcotics .  Acute gastroenteritis: Resolved  Chronic alcohol abuse: continue vitamin supplements. Ativan PRN.  Acute alcoholic intoxication: Resolved  Dehydration: Resolved     LOS: 12 days   Olive Motyka 07/12/2011, 4:39 PM   Cleora Fleet, MD, CDE, FAAFP [facility] [place], [place] [phone]

## 2011-07-13 NOTE — Progress Notes (Signed)
[facility] Inpatient Progress Note  07/13/2011  Subjective: Pt relaxing in bed watching the football game.  No complaints.  No apparent distress.    Objective:  Vital signs in last 24 hours: Filed Vitals:   07/12/11 1404 07/12/11 2133 07/13/11 0552 07/13/11 1400  BP: 98/60 117/71 107/74 101/66  Pulse: 84 73 74 74  Temp: 97.5 F (36.4 C) 97.4 F (36.3 C) 97.4 F (36.3 C) 98.2 F (36.8 C)  TempSrc: Oral Oral Oral Oral  Resp: 18 20 18 18   Height:      Weight:      SpO2: 92% 97% 95% 84%   Weight change:   Intake/Output Summary (Last 24 hours) at 07/13/11 1715 Last data filed at 07/12/11 1721  Gross per 24 hour  Intake    480 ml  Output      0 ml  Net    480 ml    Review of Systems A comprehensive review of systems was negative.  Physical Exam Gen - as above Lungs - CTA bilateral CV - nl s1, s2 Abd - obese, soft, +BS, nd/nt Ext - no c/c/e  Lab Results: No results found for this or any previous visit (from the past 24 hour(s)). Micro Results: No results found for this or any previous visit (from the past 240 hour(s)).  Studies/Results: No results found.  Medications:  Scheduled Meds:   . FLUoxetine  60 mg Oral Daily  . folic acid  1 mg Oral Daily  . nicotine  21 mg Transdermal Daily  . polyethylene glycol  17 g Oral BID  . QUEtiapine  200 mg Oral Daily  . QUEtiapine  600 mg Oral QHS  . thiamine  100 mg Oral Daily   Continuous Infusions:  PRN Meds:.acetaminophen, acetaminophen, bisacodyl, butalbital-acetaminophen-caffeine, ibuprofen, LORazepam, ondansetron, traMADol  Assessment/Plan:  Suicidal ideation: Per Psychiatry consultation. Awaiting bed offer from [facility]. Pt is medically stable for transfer to inpatient psych facility at any time.   Headache: possible tension related, continue meds, currently resolved, CT head normal-avoiding narcotics.   Acute gastroenteritis: Resolved   Chronic alcohol abuse: continue vitamin supplements. Ativan PRN.     Acute alcoholic intoxication: Resolved   Dehydration: Resolved  LOS: 13 days   Clanford Johnson 07/13/2011, 5:15 PM   Cleora Fleet, MD, CDE, FAAFP [facility] [place], [place]  [phone]

## 2011-07-14 MED ORDER — SUMATRIPTAN SUCCINATE 25 MG PO TABS
25.0000 mg | ORAL_TABLET | Freq: Once | ORAL | Status: AC
  Administered 2011-07-14: 25 mg via ORAL
  Filled 2011-07-14: qty 1

## 2011-07-14 NOTE — Plan of Care (Signed)
Problem: Phase III Progression Outcomes Goal: Discharge plan remains appropriate-arrangements made Outcome: Progressing Waiting on bed to transfer pt

## 2011-07-14 NOTE — Progress Notes (Signed)
[facility] Inpatient Progress Note  07/14/2011  Subjective: The patient currently has no medical indication for hospitalization. He is simply awaiting transfer to a psychiatric facility. I have reviewed his vitals his medication list and his laboratory data. I have chosen not to examine him today.  Objective:  Vital signs in last 24 hours: Filed Vitals:   07/13/11 1400 07/13/11 2122 07/14/11 0520 07/14/11 0750  BP: 101/66 120/78 108/74 108/74  Pulse: 74 72 87 87  Temp: 98.2 F (36.8 C) 97.7 F (36.5 C) 97.7 F (36.5 C)   TempSrc: Oral Oral Oral   Resp: 18 18 18    Height:      Weight:      SpO2: 84% 98% 95%    Weight change:   Intake/Output Summary (Last 24 hours) at 07/14/11 1001 Last data filed at 07/14/11 7829  Gross per 24 hour  Intake    480 ml  Output      0 ml  Net    480 ml    Physical Exam No exam today-no medical indication for exam  Lab Results: No results found for this or any previous visit (from the past 24 hour(s)). Micro Results: No results found for this or any previous visit (from the past 240 hour(s)).  Studies/Results: No results found.  Medications:  Scheduled Meds:    . FLUoxetine  60 mg Oral Daily  . folic acid  1 mg Oral Daily  . nicotine  21 mg Transdermal Daily  . polyethylene glycol  17 g Oral BID  . QUEtiapine  200 mg Oral Daily  . QUEtiapine  600 mg Oral QHS  . thiamine  100 mg Oral Daily   Continuous Infusions:  PRN Meds:.acetaminophen, acetaminophen, bisacodyl, butalbital-acetaminophen-caffeine, ibuprofen, LORazepam, ondansetron, traMADol  Assessment/Plan:  Suicidal ideation: Per Psychiatry consultation. Awaiting bed offer from [facility]. Pt is medically stable for transfer to inpatient psych facility at any time.   Headache: avoiding narcotics.   Acute gastroenteritis: Resolved   Chronic alcohol abuse: continue vitamin supplements-Ativan PRN  Acute alcoholic intoxication: Resolved   Dehydration: Resolved  LOS:  14 days   Carmaleta Youngers T 07/14/2011, 10:01 AM

## 2011-07-14 NOTE — Progress Notes (Signed)
  [name] is a 40 y.o. male [id number] 01-12-1971  Subjective/Objective:  Patient is doing better he feels safe in the hospital reported decrease in the intensity of the voices. Psychoeducation given to the patient regarding alcohol abuse and I discussed with him about different options available in the outpatient setting [facility]. Patient affect is still constricted but he does not seem to be responding to inner stimuli or delusional.  Filed Vitals:   07/14/11 0750  BP: 108/74  Pulse: 87  Temp:   Resp:     Lab Results:   BMET    Component Value Date/Time   NA 135 07/08/2011 0534   K 4.3 07/08/2011 0534   CL 97 07/08/2011 0534   CO2 27 07/08/2011 0534   GLUCOSE 87 07/08/2011 0534   BUN 19 07/08/2011 0534   CREATININE 1.04 07/08/2011 0534   CALCIUM 10.2 07/08/2011 0534   GFRNONAA 90* 07/08/2011 0534   GFRAA >90 07/08/2011 0534    Medications:  Scheduled:     . FLUoxetine  60 mg Oral Daily  . folic acid  1 mg Oral Daily  . nicotine  21 mg Transdermal Daily  . polyethylene glycol  17 g Oral BID  . QUEtiapine  200 mg Oral Daily  . QUEtiapine  600 mg Oral QHS  . SUMAtriptan  25 mg Oral Once  . thiamine  100 mg Oral Daily     PRN Meds acetaminophen, acetaminophen, bisacodyl, butalbital-acetaminophen-caffeine, ibuprofen, LORazepam, ondansetron, traMADol   AHLUWALIA,SHAMSHER S MD 07/14/2011

## 2011-07-15 MED ORDER — ZOLPIDEM TARTRATE 10 MG PO TABS
10.0000 mg | ORAL_TABLET | Freq: Every evening | ORAL | Status: DC | PRN
  Administered 2011-07-15 (×2): 10 mg via ORAL
  Filled 2011-07-15 (×2): qty 1

## 2011-07-15 MED ORDER — TRAMADOL HCL 50 MG PO TABS
100.0000 mg | ORAL_TABLET | Freq: Four times a day (QID) | ORAL | Status: DC | PRN
  Administered 2011-07-15 (×2): 100 mg via ORAL
  Administered 2011-07-15: 50 mg via ORAL
  Administered 2011-07-15 – 2011-07-16 (×2): 100 mg via ORAL
  Filled 2011-07-15 (×4): qty 2
  Filled 2011-07-15: qty 1

## 2011-07-15 NOTE — Progress Notes (Signed)
Referral made to [facility]) which was denied. [facility] reported pt more appropriate for [facility] and referral was made by CSW . [facility] reviewing pt's psychotic symptoms. If denied by [facility], [facility] requests new referral. CSW will continue to follow and assist with d/c.  Dellie Burns, MSW, [place] [phone]

## 2011-07-15 NOTE — Progress Notes (Signed)
[facility] Inpatient Progress Note  07/15/2011  Subjective: Still c/o headache. States he is not sleeping well.  Objective:  Vital signs in last 24 hours: Filed Vitals:   07/14/11 0750 07/14/11 1412 07/14/11 2200 07/15/11 0645  BP: 108/74 128/98 111/60 117/65  Pulse: 87 79 69 65  Temp:  97.3 F (36.3 C) 97.5 F (36.4 C) 97.7 F (36.5 C)  TempSrc:  Oral Oral Oral  Resp:  18 18 18   Height:      Weight:      SpO2:  93% 97% 98%   Weight change:   Intake/Output Summary (Last 24 hours) at 07/15/11 1215 Last data filed at 07/15/11 0700  Gross per 24 hour  Intake    840 ml  Output      0 ml  Net    840 ml    Physical Exam No exam today-no medical indication for exam  Lab Results: No results found for this or any previous visit (from the past 24 hour(s)). Micro Results: No results found for this or any previous visit (from the past 240 hour(s)).  Studies/Results: No results found.  Medications:  Scheduled Meds:    . FLUoxetine  60 mg Oral Daily  . folic acid  1 mg Oral Daily  . nicotine  21 mg Transdermal Daily  . polyethylene glycol  17 g Oral BID  . QUEtiapine  200 mg Oral Daily  . QUEtiapine  600 mg Oral QHS  . SUMAtriptan  25 mg Oral Once  . thiamine  100 mg Oral Daily   Continuous Infusions:  PRN Meds:.acetaminophen, acetaminophen, bisacodyl, butalbital-acetaminophen-caffeine, ibuprofen, LORazepam, ondansetron, traMADol, zolpidem, DISCONTD: traMADol  Assessment/Plan:  Suicidal ideation: Per Psychiatry consultation. Awaiting bed offer from [facility]. Pt is medically stable for transfer to inpatient psych facility at any time.   Headache: avoiding narcotics. May be related to insomnia (see below)  Acute gastroenteritis: Resolved   Chronic alcohol abuse: continue vitamin supplements-Ativan PRN  Acute alcoholic intoxication: Resolved   Dehydration: Resolved  Insomnia- received the  AMbien and Seroquel last night. Cont to follow up on this and adjust  meds if needed.   LOS: 15 days   Courtney Bellizzi 07/15/2011, 12:15 PM

## 2011-07-16 MED ORDER — THIAMINE HCL 100 MG PO TABS: 100.0000 mg | ORAL_TABLET | Freq: Every day | ORAL | Status: DC

## 2011-07-16 MED ORDER — ZOLPIDEM TARTRATE 10 MG PO TABS: 10.0000 mg | ORAL_TABLET | Freq: Every evening | ORAL | Status: DC | PRN

## 2011-07-16 MED ORDER — FLUOXETINE HCL 20 MG PO CAPS: 60.0000 mg | ORAL_CAPSULE | Freq: Every day | ORAL | Status: DC

## 2011-07-16 MED ORDER — ACETAMINOPHEN 325 MG PO TABS: 650.0000 mg | ORAL_TABLET | Freq: Four times a day (QID) | ORAL | Status: AC | PRN

## 2011-07-16 MED ORDER — QUETIAPINE FUMARATE 300 MG PO TABS: 600.0000 mg | ORAL_TABLET | Freq: Every day | ORAL | Status: DC

## 2011-07-16 MED ORDER — QUETIAPINE FUMARATE 200 MG PO TABS: 200.0000 mg | ORAL_TABLET | Freq: Every day | ORAL | Status: DC

## 2011-07-16 MED ORDER — FOLIC ACID 1 MG PO TABS: 1.0000 mg | ORAL_TABLET | Freq: Every day | ORAL | Status: DC

## 2011-07-16 NOTE — Progress Notes (Signed)
  [name] is a 40 y.o. male [id number] 19-Apr-1971  Subjective/Objective: Patient reported he is doing better he reported decrease in the voices intensity to upto 2/10. He denies having suicidal or homicidal ideations. Side effects reported from the medications. Again psychoeducation given to the patient regarding compliance with outpatient setting and stay away from alcohol.  Assessment/Plan: [facility] Filed Vitals:   07/16/11 0516  BP: 109/76  Pulse: 66  Temp: 97.8 F (36.6 C)  Resp: 16    Lab Results:   BMET    Component Value Date/Time   NA 135 07/08/2011 0534   K 4.3 07/08/2011 0534   CL 97 07/08/2011 0534   CO2 27 07/08/2011 0534   GLUCOSE 87 07/08/2011 0534   BUN 19 07/08/2011 0534   CREATININE 1.04 07/08/2011 0534   CALCIUM 10.2 07/08/2011 0534   GFRNONAA 90* 07/08/2011 0534   GFRAA >90 07/08/2011 0534    Medications:  Scheduled:     . FLUoxetine  60 mg Oral Daily  . folic acid  1 mg Oral Daily  . nicotine  21 mg Transdermal Daily  . polyethylene glycol  17 g Oral BID  . QUEtiapine  200 mg Oral Daily  . QUEtiapine  600 mg Oral QHS  . thiamine  100 mg Oral Daily     PRN Meds acetaminophen, acetaminophen, bisacodyl, butalbital-acetaminophen-caffeine, ibuprofen, LORazepam, ondansetron, traMADol, zolpidem   AHLUWALIA,SHAMSHER S MD 07/16/2011

## 2011-07-16 NOTE — Progress Notes (Signed)
Patient has been accepted by [facility]; involuntary commitment papers completed and signed by Dr. Sharon Seller who discussed with patient. He stated to MD that he no longer wants to go. IVC papers in place. Notified patient's nurse-Dennis of d/c. IVC papers faxed to Magistrate; notified Sherrif's office- transportation.  [name], SW Intern 07/16/2011 9:05 AM

## 2011-07-16 NOTE — Discharge Summary (Signed)
DISCHARGE SUMMARY  [name]  MR#: [id number]  DOB:Dec 21, 1970  Date of Admission: 06/30/2011 Date of Discharge: 07/16/2011  Attending Physician:MCCLUNG,JEFFREY T  Patient's PCP:No primary provider on file.  Consults:  psychiatry  Discharge Diagnoses: Present on Admission:  .Chronic alcohol abuse .Acute alcoholic intoxication .Acute gastroenteritis .Dehydration .Active smoker .Alcohol dependence with withdrawal    Current Discharge Medication List    START taking these medications   Details  acetaminophen (TYLENOL) 325 MG tablet Take 2 tablets (650 mg total) by mouth every 6 (six) hours as needed (or Fever >/= 101). Qty: 1 tablet, Refills: 0    folic acid (FOLVITE) 1 MG tablet Take 1 tablet (1 mg total) by mouth daily. Qty: 1 tablet, Refills: 0    !! QUEtiapine (SEROQUEL) 200 MG tablet Take 1 tablet (200 mg total) by mouth daily. Qty: 1 tablet, Refills: 0    !! QUEtiapine (SEROQUEL) 300 MG tablet Take 2 tablets (600 mg total) by mouth at bedtime. Qty: 1 tablet, Refills: 0    thiamine 100 MG tablet Take 1 tablet (100 mg total) by mouth daily. Qty: 1 tablet, Refills: 0    zolpidem (AMBIEN) 10 MG tablet Take 1 tablet (10 mg total) by mouth at bedtime as needed for sleep. Qty: 1 tablet, Refills: 0     !! - Potential duplicate medications found. Please discuss with provider.    CONTINUE these medications which have CHANGED   Details  FLUoxetine (PROZAC) 20 MG capsule Take 3 capsules (60 mg total) by mouth daily. Qty: 1 capsule, Refills: 0      STOP taking these medications     OLANZapine (ZYPREXA) 2.5 MG tablet      prazosin (MINIPRESS) 2 MG capsule           Hospital Course: Present on Admission:   .     Chronic alcohol abuse At the time of the patient's admission there is a questionable history of seizure-like activity. No behavior of this type was observed during his hospital stay. Given the fact that the patient is a known alcoholic he was  treated with empiric thiamine and folate during his hospital stay. The CIWA protocol was also administered during the initial portion of his hospital stay to help prevent active symptoms of withdrawal. At that time the patient's discharge he is well beyond withdrawal window and is medically stable.  .     Acute alcoholic intoxication At the time of the patient's presentation he was noted to be intoxicated on alcohol. His initial alcohol level was 211. Once a sufficient amount of time in past for the patient's alcohol intoxication to resolve the CIWA protocol was initiated as discussed above.  .     Acute gastroenteritis At the time of the patient's presentation he was complaining of nausea vomiting and diarrhea. No significant hematemesis or hematochezia was encountered. With resolution of the patient's alcohol intoxication his gastroenteritis symptoms quickly resolved as well. No further difficulty has been encountered with nausea vomiting or diarrhea during the remainder of his hospital stay.  .     Dehydration At the time of the patient's admission he was found to be markedly dehydrated. This is felt to be related to his nausea vomiting and diarrhea as well as poor by mouth intake as a result. He was volume resuscitated using isotonic crystalloid solution and he tolerated this well. At the time of his discharge he is euvolemic with stable vital signs.  .     Active smoker The patient has  been counseled as to the need for absolute and total abstinence from tobacco abuse. He has been treated with a nicotine patch during his hospital stay.     Suicidal and psychotic symptoms  During the patient's hospital stay he admitted to hearing voices that informed him that he was worthless and telling him to kill himself. He admitted to suicidal ideation. This prompted a psychiatry consultation. The patient was followed throughout the course of his hospital stay by psychiatry. It was felt that he required  inpatient psychiatric treatment for his psychotic and suicidal symptoms. Medication management in regard to psychiatric treatment was directed by the psychiatrist throughout the course of his acute stay. At the time of this dictation the patient is medically stable for discharge. He is being transferred directly to the appropriate inpatient psychiatric treatment facility as arranged by the psychiatry service.   Headache During the patient's hospital stay he intermittently complained of a diffuse generalized headache.  Initially this was felt to be related to dehydration. When his symptoms did not improve after volume resuscitation a CT scan of the head was carried out. This was without any acute findings whatsoever.  There was concern that the patient was intact exhibiting narcotic seeking behavior and using his headache and attempt to obtain narcotic medications. The medical team was careful not to treat his headache with narcotics. Imitrex was attempted but was not found to be helpful. Ultram was used and was likewise not affected. Tylenol is being used at the present time. The patient's neurologic exam is unremarkable.  At this time it is felt that the patient's headache is most likely related to a disturbance in his sleep-wake cycle and the fact that he is not sleeping more than one hour nightly per his report. It is our hope that appropriate psychiatric treatment will resolve this issue. At this time however there is no evidence to suggest that there is a life-threatening physiologic basis for his reported headaches. He is medically cleared for discharge from the hospital and for transfer to the receiving psychiatric facility.   Day of Discharge BP 109/76  Pulse 66  Temp(Src) 97.8 F (36.6 C) (Oral)  Resp 16  Ht 6' (1.829 m)  Wt 95 kg (209 lb 7 oz)  BMI 28.40 kg/m2  SpO2 97%  Physical Exam: General: No acute respiratory distress Lungs: Clear to auscultation bilaterally without wheezes or  crackles Cardiovascular: Regular rate and rhythm without murmur gallop or rub normal S1 and S2 Abdomen: Nontender, nondistended, soft, bowel sounds positive, no rebound, no ascites, no appreciable mass Extremities: No significant cyanosis, clubbing, or edema bilateral lower extremities  No results found for this or any previous visit (from the past 24 hour(s)).  Disposition: Medically clear for discharge-to be transferred directly to an inpatient psychiatric facility for treatment of psychosis and suicidal ideation   Follow-up Appts: Discharge Orders    Future Orders Please Complete By Expires   Diet - low sodium heart healthy      Increase activity slowly      Discharge instructions      Comments:   Your continued care will be provided by the accepting Phychiatric facility.      Follow-up to be arranged by the psychiatric facility at the time of his release.   Tests Needing Follow-up: None  Signed: MCCLUNG,JEFFREY T 07/16/2011, 9:08 AM

## 2011-07-16 NOTE — Progress Notes (Signed)
This Clinical research associate has reviewed above notes and is in agreement with contents and disposition.  Ileene Hutchinson , MSW, LCSWA 07/16/2011 9:21 AM

## 2011-07-16 NOTE — Discharge Instructions (Signed)
Alcohol Withdrawal  Anytime drug use is interfering with normal living activities it has become abuse. This includes problems with family and friends. Psychological dependence has developed when your mind tells you that the drug is needed. This is usually followed by physical dependence when a continuing increase of drugs are required to get the same feeling or "high." This is known as addiction or chemical dependency. A person's risk is much higher if there is a history of chemical dependency in the family.  Mild Withdrawal Following Stopping Alcohol, When Addiction or Chemical Dependency Has Developed  When a person has developed tolerance to alcohol, any sudden stopping of alcohol can cause uncomfortable physical symptoms. Most of the time these are mild and consist of tremors in the hands and increases in heart rate, breathing, and temperature. Sometimes these symptoms are associated with anxiety, panic attacks, and bad dreams. There may also be stomach upset. Normal sleep patterns are often interrupted with periods of inability to sleep (insomnia). This may last for 6 months. Because of this discomfort, many people choose to continue drinking to get rid of this discomfort and to try to feel normal.  Severe Withdrawal with Decreased or No Alcohol Intake, When Addiction or Chemical Dependency Has Developed  About five percent of alcoholics will develop signs of severe withdrawal when they stop using alcohol. One sign of this is development of generalized seizures (convulsions). Other signs of this are severe agitation and confusion. This may be associated with believing in things which are not real or seeing things which are not really there (delusions and hallucinations). Vitamin deficiencies are usually present if alcohol intake has been long-term. Treatment for this most often requires hospitalization and close observation.  Addiction can only be helped by stopping use of all chemicals. This is hard but may  save your life. With continual alcohol use, possible outcomes are usually loss of self respect and esteem, violence, and death.  Addiction cannot be cured but it can be stopped. This often requires outside help and the care of professionals. Treatment centers are listed in the yellow pages under Cocaine, Narcotics, and Alcoholics Anonymous. Most hospitals and clinics can refer you to a specialized care center.  It is not necessary for you to go through the uncomfortable symptoms of withdrawal. Your caregiver can provide you with medicines that will help you through this difficult period. Try to avoid situations, friends, or drugs that made it possible for you to keep using alcohol in the past. Learn how to say no.  It takes a long period of time to overcome addictions to all drugs, including alcohol. There may be many times when you feel as though you want a drink. After getting rid of the physical addiction and withdrawal, you will have a lessening of the craving which tells you that you need alcohol to feel normal. Call your caregiver if more support is needed. Learn who to talk to in your family and among your friends so that during these periods you can receive outside help. Alcoholics Anonymous (AA) has helped many people over the years. To get further help, contact AA or call your caregiver, counselor, or clergyperson. Al-Anon and Alateen are support groups for friends and family members of an alcoholic. The people who love and care for an alcoholic often need help, too. For information about these organizations, check your phone directory or call a local alcoholism treatment center.   SEEK IMMEDIATE MEDICAL CARE IF:   · You have a seizure.   ·   You have a fever.   · You experience uncontrolled vomiting or you vomit up blood. This may be bright red or look like black coffee grounds.   · You have blood in the stool. This may be bright red or appear as a black, tarry, bad-smelling stool.   · You become  lightheaded or faint. Do not drive if you feel this way. Have someone else drive you or call 911 for help.   · You become more agitated or confused.   · You develop uncontrolled anxiety.   · You begin to see things that are not really there (hallucinate).   Your caregiver has determined that you completely understand your medical condition, and that your mental state is back to normal. You understand that you have been treated for alcohol withdrawal, have agreed not to drink any alcohol for a minimum of 1 day, will not operate a car or other machinery for 24 hours, and have had an opportunity to ask any questions about your condition.  Document Released: 05/14/2005 Document Revised: 04/16/2011 Document Reviewed: 03/22/2008  Exit[employer]® Patient Information ©2012 [employer].

## 2011-10-29 ENCOUNTER — Emergency Department (HOSPITAL_COMMUNITY)
Admission: EM | Admit: 2011-10-29 | Discharge: 2011-10-30 | Disposition: A | Discharge status: Other Acute Inpatient Hospital | Payer: Self-pay | Attending: Emergency Medicine | Admitting: Emergency Medicine

## 2011-10-29 ENCOUNTER — Emergency Department (HOSPITAL_COMMUNITY): Payer: Self-pay

## 2011-10-29 ENCOUNTER — Encounter (HOSPITAL_COMMUNITY): Payer: Self-pay | Admitting: Family Medicine

## 2011-10-29 DIAGNOSIS — R440 Auditory hallucinations: Secondary | ICD-10-CM

## 2011-10-29 DIAGNOSIS — F1010 Alcohol abuse, uncomplicated: Secondary | ICD-10-CM

## 2011-10-29 DIAGNOSIS — R443 Hallucinations, unspecified: Secondary | ICD-10-CM | POA: Insufficient documentation

## 2011-10-29 LAB — CBC
HCT: 45.8 % (ref 39.0–52.0)
Hemoglobin: 15.8 g/dL (ref 13.0–17.0)
MCH: 30.7 pg (ref 26.0–34.0)
MCHC: 34.5 g/dL (ref 30.0–36.0)
MCV: 88.9 fL (ref 78.0–100.0)
Platelets: 365 10*3/uL (ref 150–400)
RBC: 5.15 MIL/uL (ref 4.22–5.81)
RDW: 13 % (ref 11.5–15.5)
WBC: 7.6 10*3/uL (ref 4.0–10.5)

## 2011-10-29 LAB — URINALYSIS, ROUTINE W REFLEX MICROSCOPIC
Bilirubin Urine: NEGATIVE
Glucose, UA: NEGATIVE mg/dL
Hgb urine dipstick: NEGATIVE
Ketones, ur: NEGATIVE mg/dL
Leukocytes, UA: NEGATIVE
Nitrite: NEGATIVE
Protein, ur: NEGATIVE mg/dL
Specific Gravity, Urine: 1.015 (ref 1.005–1.030)
Urobilinogen, UA: 1 mg/dL (ref 0.0–1.0)
pH: 6 (ref 5.0–8.0)

## 2011-10-29 LAB — RAPID URINE DRUG SCREEN, HOSP PERFORMED
Amphetamines: NOT DETECTED
Barbiturates: NOT DETECTED
Benzodiazepines: NOT DETECTED
Cocaine: NOT DETECTED
Opiates: NOT DETECTED
Tetrahydrocannabinol: POSITIVE — AB

## 2011-10-29 LAB — COMPREHENSIVE METABOLIC PANEL
ALT: 27 U/L (ref 0–53)
AST: 25 U/L (ref 0–37)
Albumin: 4.1 g/dL (ref 3.5–5.2)
Alkaline Phosphatase: 101 U/L (ref 39–117)
BUN: 10 mg/dL (ref 6–23)
CO2: 28 mEq/L (ref 19–32)
Calcium: 9.2 mg/dL (ref 8.4–10.5)
Chloride: 102 mEq/L (ref 96–112)
Creatinine, Ser: 0.91 mg/dL (ref 0.50–1.35)
GFR calc Af Amer: >90 mL/min (ref >90)
GFR calc non Af Amer: >90 mL/min (ref >90)
Glucose, Bld: 101 mg/dL — ABNORMAL HIGH (ref 70–99)
Potassium: 3.2 mEq/L — ABNORMAL LOW (ref 3.5–5.1)
Sodium: 143 mEq/L (ref 135–145)
Total Bilirubin: 0.3 mg/dL (ref 0.3–1.2)
Total Protein: 7.3 g/dL (ref 6.0–8.3)

## 2011-10-29 LAB — ETHANOL: Alcohol, Ethyl (B): 67 mg/dL — ABNORMAL HIGH (ref 0–11)

## 2011-10-29 LAB — SALICYLATE LEVEL: Salicylate Lvl: <2 mg/dL — ABNORMAL LOW (ref 2.8–20.0)

## 2011-10-29 LAB — ACETAMINOPHEN LEVEL: Acetaminophen (Tylenol), Serum: <15 ug/mL (ref 10–30)

## 2011-10-29 MED ORDER — LORAZEPAM 1 MG PO TABS
2.0000 mg | ORAL_TABLET | Freq: Once | ORAL | Status: AC
  Administered 2011-10-29: 2 mg via ORAL
  Filled 2011-10-29: qty 2

## 2011-10-29 NOTE — ED Notes (Signed)
Pt. To CT

## 2011-10-29 NOTE — ED Notes (Signed)
EAV:[id number]> Expected date:10/29/11<BR> Expected time:<BR> Means of arrival:<BR> Comments:<BR> EMS 261 [facility] - etoh/psych eval

## 2011-10-29 NOTE — ED Notes (Signed)
No sitters available at this time 

## 2011-10-29 NOTE — ED Notes (Signed)
Per EMS, patient reports that he had a seizure. Per EMS, patient is alcohol dependent and binge drinking. Hearing voices since being off meds in past weeks. Suicidal ideation.

## 2011-10-30 ENCOUNTER — Inpatient Hospital Stay (HOSPITAL_COMMUNITY)
Admission: EM | Admit: 2011-10-30 | Discharge: 2011-11-21 | DRG: 885 | Disposition: A | Discharge status: Home / Self Care | Payer: PRIVATE HEALTH INSURANCE | Source: Ambulatory Visit | Attending: Psychiatry | Admitting: Psychiatry

## 2011-10-30 ENCOUNTER — Encounter (HOSPITAL_COMMUNITY): Payer: Self-pay | Admitting: *Deleted

## 2011-10-30 DIAGNOSIS — F259 Schizoaffective disorder, unspecified: Principal | ICD-10-CM

## 2011-10-30 DIAGNOSIS — Z91199 Patient's noncompliance with other medical treatment and regimen due to unspecified reason: Secondary | ICD-10-CM

## 2011-10-30 DIAGNOSIS — F329 Major depressive disorder, single episode, unspecified: Secondary | ICD-10-CM

## 2011-10-30 DIAGNOSIS — R45851 Suicidal ideations: Secondary | ICD-10-CM

## 2011-10-30 DIAGNOSIS — F251 Schizoaffective disorder, depressive type: Secondary | ICD-10-CM | POA: Diagnosis present

## 2011-10-30 DIAGNOSIS — F1020 Alcohol dependence, uncomplicated: Secondary | ICD-10-CM | POA: Diagnosis present

## 2011-10-30 DIAGNOSIS — Z79899 Other long term (current) drug therapy: Secondary | ICD-10-CM

## 2011-10-30 DIAGNOSIS — F411 Generalized anxiety disorder: Secondary | ICD-10-CM

## 2011-10-30 DIAGNOSIS — F4310 Post-traumatic stress disorder, unspecified: Secondary | ICD-10-CM | POA: Diagnosis present

## 2011-10-30 DIAGNOSIS — F3289 Other specified depressive episodes: Secondary | ICD-10-CM

## 2011-10-30 DIAGNOSIS — Z9119 Patient's noncompliance with other medical treatment and regimen: Secondary | ICD-10-CM

## 2011-10-30 MED ORDER — LORAZEPAM 1 MG PO TABS
ORAL_TABLET | ORAL | Status: AC
  Administered 2011-10-30: 2 mg via ORAL
  Filled 2011-10-30: qty 2

## 2011-10-30 MED ORDER — CARBAMAZEPINE ER 200 MG PO TB12
200.0000 mg | ORAL_TABLET | Freq: Two times a day (BID) | ORAL | Status: DC
  Administered 2011-10-30 – 2011-11-03 (×9): 200 mg via ORAL
  Filled 2011-10-30 (×14): qty 1

## 2011-10-30 MED ORDER — POTASSIUM CHLORIDE 20 MEQ/15ML (10%) PO LIQD: 40.0000 meq | Freq: Once | ORAL | Status: DC

## 2011-10-30 MED ORDER — CHLORDIAZEPOXIDE HCL 25 MG PO CAPS
25.0000 mg | ORAL_CAPSULE | Freq: Four times a day (QID) | ORAL | Status: AC | PRN
  Administered 2011-10-31 – 2011-11-01 (×2): 25 mg via ORAL
  Filled 2011-10-30 (×5): qty 1

## 2011-10-30 MED ORDER — FOLIC ACID 1 MG PO TABS: 1.0000 mg | ORAL_TABLET | Freq: Every day | ORAL | Status: DC

## 2011-10-30 MED ORDER — IBUPROFEN 200 MG PO TABS: 400.0000 mg | ORAL_TABLET | Freq: Three times a day (TID) | ORAL | Status: DC | PRN

## 2011-10-30 MED ORDER — QUETIAPINE FUMARATE 200 MG PO TABS
200.0000 mg | ORAL_TABLET | Freq: Every day | ORAL | Status: DC
  Filled 2011-10-30: qty 1

## 2011-10-30 MED ORDER — MIRTAZAPINE 15 MG PO TABS: 15.0000 mg | ORAL_TABLET | Freq: Every day | ORAL | Status: DC

## 2011-10-30 MED ORDER — ONDANSETRON HCL 4 MG PO TABS: 4.0000 mg | ORAL_TABLET | Freq: Three times a day (TID) | ORAL | Status: DC | PRN

## 2011-10-30 MED ORDER — POTASSIUM CHLORIDE CRYS ER 20 MEQ PO TBCR
40.0000 meq | EXTENDED_RELEASE_TABLET | Freq: Once | ORAL | Status: AC
  Administered 2011-10-30: 40 meq via ORAL
  Filled 2011-10-30: qty 2

## 2011-10-30 MED ORDER — LORAZEPAM 1 MG PO TABS
0.0000 mg | ORAL_TABLET | Freq: Four times a day (QID) | ORAL | Status: DC
  Administered 2011-10-30: 1 mg via ORAL

## 2011-10-30 MED ORDER — NICOTINE 14 MG/24HR TD PT24
14.0000 mg | MEDICATED_PATCH | Freq: Every day | TRANSDERMAL | Status: DC
  Administered 2011-10-30 – 2011-11-02 (×3): 14 mg via TRANSDERMAL
  Filled 2011-10-30 (×8): qty 1

## 2011-10-30 MED ORDER — HYDROXYZINE HCL 25 MG PO TABS
25.0000 mg | ORAL_TABLET | Freq: Four times a day (QID) | ORAL | Status: AC | PRN
  Administered 2011-10-31 – 2011-11-01 (×3): 25 mg via ORAL

## 2011-10-30 MED ORDER — CHLORDIAZEPOXIDE HCL 25 MG PO CAPS
25.0000 mg | ORAL_CAPSULE | Freq: Three times a day (TID) | ORAL | Status: AC
  Administered 2011-10-31 (×3): 25 mg via ORAL
  Filled 2011-10-30 (×2): qty 1

## 2011-10-30 MED ORDER — ADULT MULTIVITAMIN W/MINERALS CH: 1.0000 | ORAL_TABLET | Freq: Every day | ORAL | Status: DC

## 2011-10-30 MED ORDER — LORAZEPAM 2 MG/ML IJ SOLN: 1.0000 mg | Freq: Four times a day (QID) | INTRAMUSCULAR | Status: DC | PRN

## 2011-10-30 MED ORDER — VITAMIN B-1 100 MG PO TABS
100.0000 mg | ORAL_TABLET | Freq: Every day | ORAL | Status: DC
  Administered 2011-10-31 – 2011-11-21 (×22): 100 mg via ORAL
  Filled 2011-10-30 (×24): qty 1

## 2011-10-30 MED ORDER — QUETIAPINE FUMARATE 200 MG PO TABS
200.0000 mg | ORAL_TABLET | Freq: Every day | ORAL | Status: DC
  Administered 2011-10-30: 200 mg via ORAL
  Filled 2011-10-30 (×3): qty 1

## 2011-10-30 MED ORDER — THIAMINE HCL 100 MG/ML IJ SOLN
100.0000 mg | Freq: Once | INTRAMUSCULAR | Status: AC
  Administered 2011-10-30: 100 mg via INTRAMUSCULAR

## 2011-10-30 MED ORDER — LORAZEPAM 1 MG PO TABS: 0.0000 mg | ORAL_TABLET | Freq: Two times a day (BID) | ORAL | Status: DC

## 2011-10-30 MED ORDER — ALUM & MAG HYDROXIDE-SIMETH 200-200-20 MG/5ML PO SUSP: 30.0000 mL | ORAL | Status: DC | PRN

## 2011-10-30 MED ORDER — VITAMIN B-1 100 MG PO TABS: 100.0000 mg | ORAL_TABLET | Freq: Every day | ORAL | Status: DC

## 2011-10-30 MED ORDER — ZOLPIDEM TARTRATE 5 MG PO TABS: 5.0000 mg | ORAL_TABLET | Freq: Every evening | ORAL | Status: DC | PRN

## 2011-10-30 MED ORDER — ONDANSETRON 4 MG PO TBDP: 4.0000 mg | ORAL_TABLET | Freq: Four times a day (QID) | ORAL | Status: AC | PRN

## 2011-10-30 MED ORDER — LORAZEPAM 1 MG PO TABS
2.0000 mg | ORAL_TABLET | Freq: Once | ORAL | Status: AC
  Administered 2011-10-30: 2 mg via ORAL

## 2011-10-30 MED ORDER — CHLORDIAZEPOXIDE HCL 25 MG PO CAPS
25.0000 mg | ORAL_CAPSULE | Freq: Once | ORAL | Status: AC
  Administered 2011-10-30: 25 mg via ORAL

## 2011-10-30 MED ORDER — ALUM & MAG HYDROXIDE-SIMETH 200-200-20 MG/5ML PO SUSP
30.0000 mL | ORAL | Status: DC | PRN
  Administered 2011-11-06 – 2011-11-08 (×4): 30 mL via ORAL

## 2011-10-30 MED ORDER — MAGNESIUM HYDROXIDE 400 MG/5ML PO SUSP
30.0000 mL | Freq: Every day | ORAL | Status: DC | PRN
  Administered 2011-11-10: 30 mL via ORAL

## 2011-10-30 MED ORDER — LORAZEPAM 1 MG PO TABS
1.0000 mg | ORAL_TABLET | Freq: Three times a day (TID) | ORAL | Status: DC | PRN
  Filled 2011-10-30: qty 1

## 2011-10-30 MED ORDER — THIAMINE HCL 100 MG/ML IJ SOLN: 100.0000 mg | Freq: Every day | INTRAMUSCULAR | Status: DC

## 2011-10-30 MED ORDER — ACETAMINOPHEN 325 MG PO TABS
650.0000 mg | ORAL_TABLET | Freq: Four times a day (QID) | ORAL | Status: DC | PRN
  Administered 2011-11-01 – 2011-11-21 (×11): 650 mg via ORAL

## 2011-10-30 MED ORDER — LOPERAMIDE HCL 2 MG PO CAPS: 2.0000 mg | ORAL_CAPSULE | ORAL | Status: AC | PRN

## 2011-10-30 MED ORDER — CHLORDIAZEPOXIDE HCL 25 MG PO CAPS
25.0000 mg | ORAL_CAPSULE | ORAL | Status: AC
  Administered 2011-11-01 (×2): 25 mg via ORAL
  Filled 2011-10-30 (×2): qty 1

## 2011-10-30 MED ORDER — ACETAMINOPHEN 325 MG PO TABS: 650.0000 mg | ORAL_TABLET | ORAL | Status: DC | PRN

## 2011-10-30 MED ORDER — ADULT MULTIVITAMIN W/MINERALS CH
1.0000 | ORAL_TABLET | Freq: Every day | ORAL | Status: DC
  Administered 2011-10-30 – 2011-11-21 (×23): 1 via ORAL
  Filled 2011-10-30 (×13): qty 1
  Filled 2011-10-30: qty 14
  Filled 2011-10-30 (×7): qty 1
  Filled 2011-10-30: qty 14
  Filled 2011-10-30 (×4): qty 1

## 2011-10-30 MED ORDER — CHLORDIAZEPOXIDE HCL 25 MG PO CAPS
25.0000 mg | ORAL_CAPSULE | Freq: Every day | ORAL | Status: AC
  Administered 2011-11-02: 25 mg via ORAL
  Filled 2011-10-30: qty 1

## 2011-10-30 MED ORDER — CHLORDIAZEPOXIDE HCL 25 MG PO CAPS
25.0000 mg | ORAL_CAPSULE | Freq: Four times a day (QID) | ORAL | Status: AC
  Administered 2011-10-30 (×3): 25 mg via ORAL
  Filled 2011-10-30 (×2): qty 1

## 2011-10-30 MED ORDER — QUETIAPINE FUMARATE 300 MG PO TABS
600.0000 mg | ORAL_TABLET | Freq: Every day | ORAL | Status: DC
  Administered 2011-10-30 – 2011-11-20 (×22): 600 mg via ORAL
  Filled 2011-10-30 (×7): qty 2
  Filled 2011-10-30 (×2): qty 28
  Filled 2011-10-30 (×17): qty 2

## 2011-10-30 MED ORDER — NICOTINE 21 MG/24HR TD PT24: 21.0000 mg | MEDICATED_PATCH | Freq: Every day | TRANSDERMAL | Status: DC | PRN

## 2011-10-30 MED ORDER — LORAZEPAM 1 MG PO TABS: 1.0000 mg | ORAL_TABLET | Freq: Four times a day (QID) | ORAL | Status: DC | PRN

## 2011-10-30 NOTE — Progress Notes (Signed)
Patient quite anxious today, has been pacing the halls.  Has spent a great deal of time pacing the halls.  Is attending some groups.  Has been pleasant and cooperative with staff and peers, but appears quite anxious.  Started on quetiapine today.  Tolerating well thus far.

## 2011-10-30 NOTE — Progress Notes (Signed)
Cosigned by Shelitha Magley C Aprile Dickenson, LCSWA 3/14/20134:09 PM   

## 2011-10-30 NOTE — Progress Notes (Signed)
Patient ID: [name], male   DOB: Jun 26, 1971, 41 y.o.   MRN: [id number] Pt. Pacing in the hall, endorses AH, and says voices says to hurt self, but contracts for safety. Pt. Reports anxiety as 8 out of 10. Staff will monitor q39min for safety. Pt. Remains safe on the unit.

## 2011-10-30 NOTE — Progress Notes (Signed)
[facility] Group Notes:  (Counselor/Nursing/MHT/Case Management/Adjunct)  10/30/2011 3:38 PM  Type of Therapy:  1:15PM Group Therapy  Participation Level:  Minimal  Participation Quality:  Appropriate and Attentive  Affect:  Flat  Cognitive:  Hallucinating  Insight:  Limited  Engagement in Group:  Limited  Engagement in Therapy:  Limited  Modes of Intervention:  Clarification, Education, Support and Exploration  Summary of Progress/Problems: Patient reported that he feels like he is on the "wrong" hallway, because he states he is "hearing voices and not dealing with alcohol or substance issues". Patient chose a picture of a guy walking while carrying a book bag. Patient states this picture represents his life when it is balanced, because it reminds him of when he was attending school and productive. Patient began to reflect on his past achievements of graduating from school.   [name] 10/30/2011, 3:38 PM

## 2011-10-30 NOTE — Tx Team (Signed)
Initial Interdisciplinary Treatment Plan  PATIENT STRENGTHS: (choose at least two) Average or above average intelligence Communication skills General fund of knowledge  PATIENT STRESSORS: Financial difficulties Medication change or noncompliance Substance abuse   PROBLEM LIST: Problem List/Patient Goals Date to be addressed Date deferred Reason deferred Estimated date of resolution  SI 10-30-11     Depression 10-30-11     ETOH/THC use 10-30-11                                          DISCHARGE CRITERIA:  Adequate post-discharge living arrangements Improved stabilization in mood, thinking, and/or behavior Motivation to continue treatment in a less acute level of care Need for constant or close observation no longer present Verbal commitment to aftercare and medication compliance  PRELIMINARY DISCHARGE PLAN: Attend aftercare/continuing care group Attend 12-step recovery group Participate in family therapy Return to previous living arrangement  PATIENT/FAMIILY INVOLVEMENT: This treatment plan has been presented to and reviewed with the patient, Quinlan Vollmer, and/or family member.  The patient and family have been given the opportunity to ask questions and make suggestions.  Mickeal Needy 10/30/2011, 5:47 AM

## 2011-10-30 NOTE — Discharge Instructions (Signed)
Alcohol Problems Most adults who drink alcohol drink in moderation (not a lot) are at low risk for developing problems related to their drinking. However, all drinkers, including low-risk drinkers, should know about the health risks connected with drinking alcohol. RECOMMENDATIONS FOR LOW-RISK DRINKING  Drink in moderation. Moderate drinking is defined as follows:   Men - no more than 2 drinks per day.   Nonpregnant women - no more than 1 drink per day.   Over age 39 - no more than 1 drink per day.  A standard drink is 12 grams of pure alcohol, which is equal to a 12 ounce bottle of beer or wine cooler, a 5 ounce glass of wine, or 1.5 ounces of distilled spirits (such as whiskey, brandy, vodka, or rum).  ABSTAIN FROM (DO NOT DRINK) ALCOHOL:  When pregnant or considering pregnancy.   When taking a medication that interacts with alcohol.   If you are alcohol dependent.   A medical condition that prohibits drinking alcohol (such as ulcer, liver disease, or heart disease).  DISCUSS WITH YOUR CAREGIVER:  If you are at risk for coronary heart disease, discuss the potential benefits and risks of alcohol use: Light to moderate drinking is associated with lower rates of coronary heart disease in certain populations (for example, men over age 87 and postmenopausal women). Infrequent or nondrinkers are advised not to begin light to moderate drinking to reduce the risk of coronary heart disease so as to avoid creating an alcohol-related problem. Similar protective effects can likely be gained through proper diet and exercise.   Women and the elderly have smaller amounts of body water than men. As a result women and the elderly achieve a higher blood alcohol concentration after drinking the same amount of alcohol.   Exposing a fetus to alcohol can cause a broad range of birth defects referred to as Fetal Alcohol Syndrome (FAS) or Alcohol-Related Birth Defects (ARBD). Although FAS/ARBD is connected with  excessive alcohol consumption during pregnancy, studies also have reported neurobehavioral problems in infants born to mothers reporting drinking an average of 1 drink per day during pregnancy.   Heavier drinking (the consumption of more than 4 drinks per occasion by men and more than 3 drinks per occasion by women) impairs learning (cognitive) and psychomotor functions and increases the risk of alcohol-related problems, including accidents and injuries.  CAGE QUESTIONS:   Have you ever felt that you should Cut down on your drinking?   Have people Annoyed you by criticizing your drinking?   Have you ever felt bad or Guilty about your drinking?   Have you ever had a drink first thing in the morning to steady your nerves or get rid of a hangover (Eye opener)?  If you answered positively to any of these questions: You may be at risk for alcohol-related problems if alcohol consumption is:   Men: Greater than 14 drinks per week or more than 4 drinks per occasion.   Women: Greater than 7 drinks per week or more than 3 drinks per occasion.  Do you or your family have a medical history of alcohol-related problems, such as:  Blackouts.   Sexual dysfunction.   Depression.   Trauma.   Liver dysfunction.   Sleep disorders.   Hypertension.   Chronic abdominal pain.   Has your drinking ever caused you problems, such as problems with your family, problems with your work (or school) performance, or accidents/injuries?   Do you have a compulsion to drink or a preoccupation  Do you have a compulsion to drink or a preoccupation with drinking?   Do you have poor control or are you unable to stop drinking once you have started?   Do you have to drink to avoid withdrawal symptoms?   Do you have problems with withdrawal such as tremors, nausea, sweats, or mood disturbances?   Does it take more alcohol than in the past to get you high?   Do you feel a strong urge to drink?   Do you change your plans so that you can have a drink?   Do you ever drink in the morning to relieve  the shakes or a hangover?  If you have answered a number of the previous questions positively, it may be time for you to talk to your caregivers, family, and friends and see if they think you have a problem. Alcoholism is a chemical dependency that keeps getting worse and will eventually destroy your health and relationships. Many alcoholics end up dead, impoverished, or in prison. This is often the end result of all chemical dependency.   Do not be discouraged if you are not ready to take action immediately.   Decisions to change behavior often involve up and down desires to change and feeling like you cannot decide.   Try to think more seriously about your drinking behavior.   Think of the reasons to quit.  WHERE TO GO FOR ADDITIONAL INFORMATION    The National Institute on Alcohol Abuse and Alcoholism (NIAAA)www.niaaa.nih.gov   National Council on Alcoholism and Drug Dependence (NCADD)www.ncadd.org   American Society of Addiction Medicine (ASAM)www.asam.org  Document Released: 08/04/2005 Document Revised: 07/24/2011 Document Reviewed: 03/22/2008  Exit[employer] Patient Information 2012 [employer].

## 2011-10-30 NOTE — ED Notes (Signed)
ACT team at bedside speaking with patient.  
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
## 2011-10-30 NOTE — Progress Notes (Signed)
10/30/2011         Time: 1415      Group Topic/Focus: The focus of this group is on discussing various styles of communication and communicating assertively using 'I' (feeling) statements.  Participation Level: Active  Participation Quality: Appropriate  Affect: Blunted  Cognitive: Oriented  Additional Comments: Patient calm and cooperative, reports he is glad he is here to get help.   Nettie Cromwell 10/30/2011 3:58 PM

## 2011-10-30 NOTE — Progress Notes (Signed)
[facility] Group Notes:  (Counselor/Nursing/MHT/Case Management/Adjunct)  10/30/2011 12:54 PM  Type of Therapy:  Group Therapy  Participation Level:  Did Not Attend  [name] 10/30/2011, 12:54 PM

## 2011-10-30 NOTE — Progress Notes (Signed)
Patient ID: Aundrey Elahi, male   DOB: [birth year], 41 y.o.   MRN: [id number] Pt. Is as 41 year old male known to [facility] recent admit in 9/12, released from [facility] 12/12, had not been taking his med c/o [facility]H since a week ago, also having SI with no plans. Pt. Reports that he lives in a motel. Pt. Is applying for disability. Pt. Was being followed by [facility] last pill bottles dates in 2/13.  Seroquel 200 mg 3tabs at hs, Gabapentin 300 mg 1tab am, 5pm and hs, Prozac 20 mg 3caps  In am. Pt. Has medical hx of right foot surgery and seizures most recent last pm. Pt. Made a fall risk due to fall last pm.  Pt. Is a smoker of 1 pack a day. Pt. Offered something to eat and drink. Pt. Oriented to united. Staff will monitor q36min for safety. Pt. Remains safe on the unit.

## 2011-10-30 NOTE — Discharge Planning (Signed)
Met with pt briefly on this day. Pt reports coming to the hospital due to running out of his supply of medication. Pt reports that he has been at [facility] in the past, most recently in September. Pt also reports at least 4 hospitalizations within the last year. Pt states that he is homeless and that he has been living in a hotel. Pt has no interest in going to rehab at this moment and feels that drugs and alcohol are not an issue for him. Pt reports that he was referred to f/u at [facility] as a walk-in for his medications but has yet to do so. CM to schedule appointment with [facility].

## 2011-10-30 NOTE — ED Provider Notes (Signed)
History     CSN: [id number]  Arrival date & time 10/29/11  2121   First MD Initiated Contact with Patient 10/29/11 2218      Chief Complaint  Patient presents with  . Alcohol Intoxication  . Medical Clearance  . Hallucinations    HPI Pt was seen at 2300.  Per pt, c/o gradual onset and worsening of persistent auditory hallucinations for the past several weeks.  Pt states his symptoms began after he stopped taking his psych meds.  Pt states he hears 2 voices, "one is Jesus and one is the [place]."  States "they always say bad things about me and tell me to hurt myself."  Also states he "might have had a seizure" today because he "didn't drink as much etoh as usual."  LD this morning.  Denies any other complaints.  Denies CP/SOB, no cough, no abd pain, no N/V/D, no fevers.   Past Medical History  Diagnosis Date  . Depression   . Psychosis   . PTSD (post-traumatic stress disorder)   . Seizure disorder     related to etoh seizure  . Alcohol abuse   . Schizoaffective disorder     Past Surgical History  Procedure Date  . Foot surgery     right    History  Substance Use Topics  . Smoking status: Current Everyday Smoker -- 1.5 packs/day  . Smokeless tobacco: Never Used  . Alcohol Use: Yes     1 fifth per day    Review of Systems ROS: Statement: All systems negative except as marked or noted in the HPI; Constitutional: Negative for fever and chills. ; ; Eyes: Negative for eye pain, redness and discharge. ; ; ENMT: Negative for ear pain, hoarseness, nasal congestion, sinus pressure and sore throat. ; ; Cardiovascular: Negative for chest pain, palpitations, diaphoresis, dyspnea and peripheral edema. ; ; Respiratory: Negative for cough, wheezing and stridor. ; ; Gastrointestinal: Negative for nausea, vomiting, diarrhea, abdominal pain, blood in stool, hematemesis, jaundice and rectal bleeding. . ; ; Genitourinary: Negative for dysuria, flank pain and hematuria. ; ; Musculoskeletal:  Negative for back pain and neck pain. Negative for swelling and trauma.; ; Skin: Negative for pruritus, rash, abrasions, blisters, bruising and skin lesion.; ; Neuro: Negative for headache, lightheadedness and neck stiffness. Negative for weakness, altered level of consciousness , altered mental status, extremity weakness, paresthesias, involuntary movement, and syncope. +seizure; Psych:  +SI, no SA, no HI, +auditory hallucinations.      Allergies  Review of patient's allergies indicates no known allergies.  Home Medications   Current Outpatient Rx  Name Route Sig Dispense Refill  . FLUOXETINE HCL 20 MG PO CAPS Oral Take 60 mg by mouth daily.    [name] GABAPENTIN 300 MG PO CAPS Oral Take 300-600 mg by mouth 3 (three) times daily. Patient takes 1 capsule in the morning, 1 capsule at 5pm, and 2 at bedtime    . QUETIAPINE FUMARATE 200 MG PO TABS Oral Take 200-600 mg by mouth 2 (two) times daily. Patient takes 1 tablet in the morning and 3 at bedtime      BP 132/87  Pulse 69  Temp(Src) 98.5 F (36.9 C) (Oral)  Resp 18  SpO2 95%  Physical Exam 2305: Physical examination:  Nursing notes reviewed; Vital signs and O2 SAT reviewed;  Constitutional: Well developed, Well nourished, Well hydrated, restless; Head:  Normocephalic, atraumatic; Eyes: EOMI, PERRL, No scleral icterus; ENMT: Mouth and pharynx normal, Mucous membranes moist; Neck: Supple, Full  range of motion, No lymphadenopathy; Cardiovascular: Regular rate and rhythm, No murmur, rub, or gallop; Respiratory: Breath sounds clear & equal bilaterally, No rales, rhonchi, wheezes, or rub, Normal respiratory effort/excursion; Chest: Nontender, Movement normal; Abdomen: Soft, Nontender, Nondistended, Normal bowel sounds; Extremities: Pulses normal, No tenderness, No edema, No calf edema or asymmetry.; Neuro: AA&Ox3, Major CN grossly intact. Speech clear, no facial droop. No gross focal motor or sensory deficits in extremities.; Skin: Color normal, Warm,  Dry; Psych:  Affect flat, +SI, +auditory hallucinations.    ED Course  Procedures   MDM  MDM Reviewed: nursing note, previous chart and vitals Interpretation: labs and CT scan   Results for orders placed during the hospital encounter of 10/29/11  URINALYSIS, ROUTINE W REFLEX MICROSCOPIC      Component Value Range   Color, Urine YELLOW  YELLOW    APPearance CLEAR  CLEAR    Specific Gravity, Urine 1.015  1.005 - 1.030    pH 6.0  5.0 - 8.0    Glucose, UA NEGATIVE  NEGATIVE (mg/dL)   Hgb urine dipstick NEGATIVE  NEGATIVE    Bilirubin Urine NEGATIVE  NEGATIVE    Ketones, ur NEGATIVE  NEGATIVE (mg/dL)   Protein, ur NEGATIVE  NEGATIVE (mg/dL)   Urobilinogen, UA 1.0  0.0 - 1.0 (mg/dL)   Nitrite NEGATIVE  NEGATIVE    Leukocytes, UA NEGATIVE  NEGATIVE   URINE RAPID DRUG SCREEN (HOSP PERFORMED)      Component Value Range   Opiates NONE DETECTED  NONE DETECTED    Cocaine NONE DETECTED  NONE DETECTED    Benzodiazepines NONE DETECTED  NONE DETECTED    Amphetamines NONE DETECTED  NONE DETECTED    Tetrahydrocannabinol POSITIVE (*) NONE DETECTED    Barbiturates NONE DETECTED  NONE DETECTED   CBC      Component Value Range   WBC 7.6  4.0 - 10.5 (K/uL)   RBC 5.15  4.22 - 5.81 (MIL/uL)   Hemoglobin 15.8  13.0 - 17.0 (g/dL)   HCT 19.1  47.8 - 29.5 (%)   MCV 88.9  78.0 - 100.0 (fL)   MCH 30.7  26.0 - 34.0 (pg)   MCHC 34.5  30.0 - 36.0 (g/dL)   RDW 62.1  30.8 - 65.7 (%)   Platelets 365  150 - 400 (K/uL)  COMPREHENSIVE METABOLIC PANEL      Component Value Range   Sodium 143  135 - 145 (mEq/L)   Potassium 3.2 (*) 3.5 - 5.1 (mEq/L)   Chloride 102  96 - 112 (mEq/L)   CO2 28  19 - 32 (mEq/L)   Glucose, Bld 101 (*) 70 - 99 (mg/dL)   BUN 10  6 - 23 (mg/dL)   Creatinine, Ser 8.46  0.50 - 1.35 (mg/dL)   Calcium 9.2  8.4 - 96.2 (mg/dL)   Total Protein 7.3  6.0 - 8.3 (g/dL)   Albumin 4.1  3.5 - 5.2 (g/dL)   AST 25  0 - 37 (U/L)   ALT 27  0 - 53 (U/L)   Alkaline Phosphatase 101  39 - 117  (U/L)   Total Bilirubin 0.3  0.3 - 1.2 (mg/dL)   GFR calc non Af Amer >90  >90 (mL/min)   GFR calc Af Amer >90  >90 (mL/min)  ETHANOL      Component Value Range   Alcohol, Ethyl (B) 67 (*) 0 - 11 (mg/dL)  ACETAMINOPHEN LEVEL      Component Value Range   Acetaminophen (Tylenol), Serum <15.0  10 - 30 (ug/mL)  SALICYLATE LEVEL      Component Value Range   Salicylate Lvl <2.0 (*) 2.8 - 20.0 (mg/dL)   Ct Head Wo Contrast 10/29/2011  *RADIOLOGY REPORT*  Clinical Data: Seizure and fall  CT HEAD WITHOUT CONTRAST  Technique:  Contiguous axial images were obtained from the base of the skull through the vertex without contrast.  Comparison: 07/07/2011  Findings: No mass effect.  No midline shift.  No acute intracranial hemorrhage.  Mastoid air cells are clear.  Visualized paranasal sinuses are clear.  The cranium intact.  IMPRESSION: Negative head CT.  Original Report Authenticated By: Donavan Burnet, M.D.      12:21 AM:  Ativan given PO on arrival to ED.  Pt appears calmer now.  No seizure in ED.  Will replete potassium PO.  ACT called, will eval.  Holding orders written.         Laray Anger, DO 10/30/11 1523

## 2011-10-30 NOTE — ED Provider Notes (Deleted)
3:26 AM Patient cleared for outpatient referral by telepsychiatrist.    Hanley Seamen, MD 10/30/11 [phone]

## 2011-10-30 NOTE — Progress Notes (Signed)
Adult Psychosocial Assessment Update Interdisciplinary Team  [facility]dis[facility]es:  Admissions Discharges  Date: 08/277/12 Date:  Date: Date:  Date: Date:  Date: Date:  Date: Date:   Changes since the last Psychosocial Assessment (including adherence to outpatient mental health and/or substance abuse treatment, situational issues contributing to decompensation and/or relapse).   Patient was here last fall for several weeks before going to [facility] where he reports leaving a few months ago "sometime around the holidays." The patient reports difficulty remembering exactly how long he has been off psychotropic meds; either several weeks for several months. Reports he did not medication at [facility] due to cost which is around $25 per month; patient reports self medication with alcohol. Patient has been staying at a hotel for the last month with financial help from mother patient is experiencing suicidal ideation and voices, AH.              Discharge Plan 1. Will you be returning to the same living situation after discharge?   Yes: No:      If no, what is your plan?     Uncertain       2. Would you like a referral for services when you are discharged? Yes:     If yes, for what services?  No:        Uncertain       Summary and Recommendations (to be completed by the evaluator) Patient is a 41 year old Caucasian male it diagnosis of major depressive disorder severe with psychotic features, alcohol dependence, PTSD. Patient reports experiencing extreme suicidal ideation. Patient will benefit from crisis stabilization, medication evaluation, group therapy and psychoeducation, in addition to case management for discharge planning.                        Signature:  Clide Dales, 10/30/2011 6:07 PM

## 2011-10-30 NOTE — H&P (Signed)
Psychiatric Admission Assessment Adult  Patient Identification:  [name] Date of Evaluation:  10/30/2011 Chief Complaint:  Alcohol Dependence History of Present Illness: Pt. Presented to [facility] via EMS when a neighbor at his hotel who witnessed him have a seizure.  [name] reports that he was using alcohol to "self medicate" since he was not able to get his medication.  He has a history of mental illness and noncompliance with medication.  He was just released from [place] a couple of months previously where he was seen by "Dr. Waymon Budge."  He states that he is having symptoms of suicidal ideation, depression, with sadness, he also notes that he is hearing voices of both Jesus and the [place], as well as seeing "shadow people" at night.  He does note some command voices as well. Past Psychiatric History: Pt. States he has had a history of Schizoaffective disorder, PTSD, GAD, and reports at least 10 admissions.  He has had a few "detox" admissions as well. Past Medical History:   Past Medical History  Diagnosis Date  . Depression   . Psychosis   . PTSD (post-traumatic stress disorder)   . Seizure disorder     related to etoh seizure  . Alcohol abuse   . Schizoaffective disorder    Allergies:  No Known Allergies  PTA Medications: Prescriptions prior to admission  Medication Sig Dispense Refill  . FLUoxetine (PROZAC) 20 MG capsule Take 60 mg by mouth daily.      [name] gabapentin (NEURONTIN) 300 MG capsule Take 300-600 mg by mouth 3 (three) times daily. Patient takes 1 capsule in the morning, 1 capsule at 5pm, and 2 at bedtime      . QUEtiapine (SEROQUEL) 200 MG tablet Take 200-600 mg by mouth 2 (two) times daily. Patient takes 1 tablet in the morning and 3 at bedtime      Previous Psychotropic Medications: Klonopin, Zyprexa, Geodon, Throazine, Lithium, Fluoxetine, Seroquel Substance Abuse History in the last 12 months:  Social History: Current Place of Residence:   Place of Birth:   [place] Employment: Marital Status:  Divorced Children: Education:  Financial planner History:  None. Legal History: Family History:  History reviewed. No pertinent family history.  ROS: As noted in the HPI. PE: Completed in ED. Pt evaluated and results reviewed.  Mental Status Examination/Evaluation: Appearance: Disheveled  Eye Contact::  Minimal  Speech:  Slow  Volume:  Normal  Mood:  Depressed  Affect:  Flat  Thought Process:  Goal Directed and Logical  Orientation:  Full  Thought Content:  Hallucinations: Auditory Command:  Jesus and the [employer]  Suicidal Thoughts:  Yes.  without intent/plan but can contract for safety  Homicidal Thoughts:  No  Memory:  Immediate;   Fair  Judgement:  Fair  Insight:  Lacking  Psychomotor Activity:  Normal  Concentration:  Fair  Recall:  Fair  Akathisia:  No  Handed:    AIMS (if indicated):     Assets:  Desire for Improvement  Sleep:      Labs: Xray:  Assessment:    AXIS I:  Schizoaffective disorder, alcohol abuse with hx of w/d seizures, medical noncompliance AXIS II:  Deferred AXIS III:   Past Medical History  Diagnosis Date  . Depression   . Psychosis   . PTSD (post-traumatic stress disorder)   . Seizure disorder     related to etoh seizure  . Alcohol abuse   . Schizoaffective disorder    AXIS IV:  housing problems,  problems related to social environment, problems with access to health care services and problems with primary support group AXIS V:  51-60 moderate symptoms  Treatment Plan/Recommendations: Admit for crisis stabilization and supportive care to include detox protocol for alcohol dependence, opiate dependence, benzodiazepine dependence as needed. Evaluation and treatment for medical problems associated with current state of health.  Treatment Plan Summary: Daily contact with patient to assess and evaluate symptoms and progress in treatment Medication management Current Medications:  Current  Facility-Administered Medications  Medication Dose Route Frequency Provider Last Rate Last Dose  . acetaminophen (TYLENOL) tablet 650 mg  650 mg Oral Q6H PRN Jorje Guild, PA      . alum & mag hydroxide-simeth (MAALOX/MYLANTA) 200-200-20 MG/5ML suspension 30 mL  30 mL Oral Q4H PRN Jorje Guild, PA      . carbamazepine (TEGRETOL XR) 12 hr tablet 200 mg  200 mg Oral BID Jorje Guild, PA   200 mg at 10/30/11 1204  . chlordiazePOXIDE (LIBRIUM) capsule 25 mg  25 mg Oral Q6H PRN Jorje Guild, PA      . chlordiazePOXIDE (LIBRIUM) capsule 25 mg  25 mg Oral Once Jorje Guild, [place]   25 mg at 10/30/11 0833  . chlordiazePOXIDE (LIBRIUM) capsule 25 mg  25 mg Oral QID Jorje Guild, PA   25 mg at 10/30/11 1204   Followed by  . chlordiazePOXIDE (LIBRIUM) capsule 25 mg  25 mg Oral TID Jorje Guild, PA       Followed by  . chlordiazePOXIDE (LIBRIUM) capsule 25 mg  25 mg Oral BH-qamhs Jorje Guild, PA       Followed by  . chlordiazePOXIDE (LIBRIUM) capsule 25 mg  25 mg Oral Daily Jorje Guild, [place]      . hydrOXYzine (ATARAX/VISTARIL) tablet 25 mg  25 mg Oral Q6H PRN Jorje Guild, PA      . loperamide (IMODIUM) capsule 2-4 mg  2-4 mg Oral PRN Jorje Guild, PA      . magnesium hydroxide (MILK OF MAGNESIA) suspension 30 mL  30 mL Oral Daily PRN Jorje Guild, PA      . mulitivitamin with minerals tablet 1 tablet  1 tablet Oral Daily Jorje Guild, PA   1 tablet at 10/30/11 [phone]  . ondansetron (ZOFRAN-ODT) disintegrating tablet 4 mg  4 mg Oral Q6H PRN Jorje Guild, PA      . thiamine (B-1) injection 100 mg  100 mg Intramuscular Once Jorje Guild, PA   100 mg at 10/30/11 0833  . thiamine (VITAMIN B-1) tablet 100 mg  100 mg Oral Daily Jorje Guild, PA       Facility-Administered Medications Ordered in Other Encounters  Medication Dose Route Frequency Provider Last Rate Last Dose  . LORazepam (ATIVAN) tablet 2 mg  2 mg Oral Once Laray Anger, DO   2 mg at 10/29/11 2321  . potassium chloride SA (K-DUR,KLOR-CON) CR tablet 40 mEq  40 mEq Oral Once Laray Anger, DO   40 mEq at 10/30/11 0114  . DISCONTD: acetaminophen (TYLENOL) tablet 650 mg  650 mg Oral Q4H PRN Laray Anger, DO      . DISCONTD: alum & mag hydroxide-simeth (MAALOX/MYLANTA) 200-200-20 MG/5ML suspension 30 mL  30 mL Oral PRN Laray Anger, DO      . DISCONTD: folic acid (FOLVITE) tablet 1 mg  1 mg Oral Daily Laray Anger, DO      . DISCONTD: ibuprofen (ADVIL,MOTRIN) tablet 400 mg  400 mg Oral Q8H PRN Magnus Sinning  Clarene Duke, DO      . DISCONTD: LORazepam (ATIVAN) injection 1 mg  1 mg Intravenous Q6H PRN Laray Anger, DO      . DISCONTD: LORazepam (ATIVAN) tablet 0-4 mg  0-4 mg Oral Q6H Laray Anger, DO   1 mg at 10/30/11 0114  . DISCONTD: LORazepam (ATIVAN) tablet 0-4 mg  0-4 mg Oral Q12H Laray Anger, DO      . DISCONTD: LORazepam (ATIVAN) tablet 1 mg  1 mg Oral Q8H PRN Laray Anger, DO      . DISCONTD: LORazepam (ATIVAN) tablet 1 mg  1 mg Oral Q6H PRN Laray Anger, DO      . DISCONTD: mulitivitamin with minerals tablet 1 tablet  1 tablet Oral Daily Laray Anger, DO      . DISCONTD: nicotine (NICODERM CQ - dosed in mg/24 hours) patch 21 mg  21 mg Transdermal Daily PRN Laray Anger, DO      . DISCONTD: ondansetron [facility]) tablet 4 mg  4 mg Oral Q8H PRN Laray Anger, DO      . DISCONTD: potassium chloride 20 MEQ/15ML (10%) liquid 40 mEq  40 mEq Oral Once Laray Anger, DO      . DISCONTD: thiamine (B-1) injection 100 mg  100 mg Intravenous Daily Laray Anger, DO      . DISCONTD: thiamine (VITAMIN B-1) tablet 100 mg  100 mg Oral Daily Laray Anger, DO      . DISCONTD: zolpidem (AMBIEN) tablet 5 mg  5 mg Oral QHS PRN Laray Anger, DO        Observation Level/Precautions:  Seizure  Laboratory:       Routine PRN Medications:  Yes  Consultations:    Discharge Concerns:    Other:      Lloyd Huger T. Joash Tony PAC For Dr. Lupe Carney  3/14/20131:19 PM
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
## 2011-10-30 NOTE — ED Notes (Signed)
Patient transported to [facility] by [facility] security, accompanied by sitter.

## 2011-10-30 NOTE — BH Assessment (Signed)
Pt accepted to [facility], bed 304-2. Jorje Guild accepting to [facility]. EDP and RN notified. Support paperwork signed and faxed to [facility].

## 2011-10-31 MED ORDER — QUETIAPINE FUMARATE 200 MG PO TABS
200.0000 mg | ORAL_TABLET | Freq: Every day | ORAL | Status: DC
  Administered 2011-10-31 – 2011-11-21 (×22): 200 mg via ORAL
  Filled 2011-10-31 (×10): qty 1
  Filled 2011-10-31: qty 14
  Filled 2011-10-31 (×3): qty 1
  Filled 2011-10-31: qty 14
  Filled 2011-10-31 (×11): qty 1
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
## 2011-10-31 NOTE — Treatment Plan (Signed)
Interdisciplinary Treatment Plan Update (Adult)  Date: 10/31/2011  Time Reviewed: 8:10 AM   Progress in Treatment: Attending groups: Yes Participating in groups: Yes Taking medication as prescribed: Yes Tolerating medication: Yes   Family/Significant other contact made:Counselor to attempt to contact family or other identified resource for SI prevention info  Patient understands diagnosis:  Limited  Identifies mental health needs, denies substance abuse issues Discussing patient identified problems/goals with staff:  Yes  See below Medical problems stabilized or resolved:  Yes Denies suicidal/homicidal ideation: No But contracts for safety in tx team Issues/concerns per patient self-inventory:  Yes Depression and hopelessness 9  C/O dizziness, headaches Other:  New problem(s) identified: N/A  Reason for Continuation of Hospitalization: Depression Medication stabilization Suicidal ideation Withdrawal symptoms  Interventions implemented related to continuation of hospitalization:   Adjust medications, add second anti-psychotic, refer to ACT team for follow up,  Encourage group attendance and participation  Additional comments:  Estimated length of stay: 3-4 days  Discharge Plan:  Likely return to community, follow up with ACT team  New goal(s): N/A  Review of initial/current patient goals per problem list:   1.  Goal(s): Safely detox from alcohol   Met:  No  Target date:3/17  As evidenced KG:MWNU of 0, stable vitals  2.  Goal (s): Eliminate SI  Met:  No  Target date:3/18  As evidenced UV:OZDG report  3.  Goal(s):Eliminate psychosis  Met:  No  Target date:3/17  As evidenced UY:QIHK report  4.  Goal(s):Decrease depression  Met:  No  Target date:3/18  As evidenced VQ:QVZDGL of 4 or less on self inventory  Attendees: Patient:  [name] 10/31/2011 8:10 AM  Family:     Physician:  Lupe Carney 10/31/2011 8:10 AM   Nursing:  [place] Cellar   10/31/2011 8:10 AM   Case Manager:  Richelle Ito, LCSW 10/31/2011 8:10 AM   Counselor:  Vanetta Mulders 10/31/2011 8:10 AM   Other: Verne Spurr PA  10/31/2011 8:10 AM  Other:     Other:     Other:      Scribe for Treatment Team:   Ida Rogue, 10/31/2011 8:10 AM
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
## 2011-10-31 NOTE — Progress Notes (Signed)
[facility] MD Progress Note  10/31/2011 10:13 AM  Diagnosis:    ADL's:  improving  Sleep: Fair  Appetite:  Fair  Suicidal Ideation:  +ideation, no intent Homicidal Ideation:  none  AEB (as evidenced by):  Mental Status Examination/Evaluation: Objective:  Appearance: Disheveled  Eye Contact::  Fair  Speech:  Normal Rate  Volume:  Normal  Mood:  Depressed  Affect:  Congruent  Thought Process:  Linear  Orientation:  Full  Thought Content:  WDL  Suicidal Thoughts:  Yes.  without intent/plan  Homicidal Thoughts:  No  Memory:  Recent;   Fair  Judgement:  Poor  Insight:  Lacking  Psychomotor Activity:  Normal  Concentration:  Poor  Recall:  Poor  Akathisia:  No  Handed:    AIMS (if indicated):     Assets:  Desire for Improvement  Sleep:  Number of Hours: 5.25    Vital Signs:Blood pressure 131/87, pulse 99, temperature 97.6 F (36.4 C), temperature source Oral, resp. rate 16, height 5' 11.5" (1.816 m), weight 99.791 kg (220 lb). Current Medications: Current Facility-Administered Medications  Medication Dose Route Frequency Provider Last Rate Last Dose  . acetaminophen (TYLENOL) tablet 650 mg  650 mg Oral Q6H PRN Jorje Guild, PA      . alum & mag hydroxide-simeth (MAALOX/MYLANTA) 200-200-20 MG/5ML suspension 30 mL  30 mL Oral Q4H PRN Jorje Guild, PA      . carbamazepine (TEGRETOL XR) 12 hr tablet 200 mg  200 mg Oral BID Jorje Guild, PA   200 mg at 10/31/11 0801  . chlordiazePOXIDE (LIBRIUM) capsule 25 mg  25 mg Oral Q6H PRN Jorje Guild, PA      . chlordiazePOXIDE (LIBRIUM) capsule 25 mg  25 mg Oral QID Jorje Guild, PA   25 mg at 10/30/11 2213   Followed by  . chlordiazePOXIDE (LIBRIUM) capsule 25 mg  25 mg Oral TID Jorje Guild, PA   25 mg at 10/31/11 1610   Followed by  . chlordiazePOXIDE (LIBRIUM) capsule 25 mg  25 mg Oral BH-qamhs Jorje Guild, PA       Followed by  . chlordiazePOXIDE (LIBRIUM) capsule 25 mg  25 mg Oral Daily Jorje Guild, [place]      . hydrOXYzine (ATARAX/VISTARIL) tablet 25  mg  25 mg Oral Q6H PRN Jorje Guild, PA      . loperamide (IMODIUM) capsule 2-4 mg  2-4 mg Oral PRN Jorje Guild, PA      . LORazepam (ATIVAN) tablet 2 mg  2 mg Oral Once Alyson Kuroski-Mazzei, DO   2 mg at 10/30/11 1702  . magnesium hydroxide (MILK OF MAGNESIA) suspension 30 mL  30 mL Oral Daily PRN Jorje Guild, PA      . mulitivitamin with minerals tablet 1 tablet  1 tablet Oral Daily Jorje Guild, [place]   1 tablet at 10/31/11 0801  . nicotine (NICODERM CQ - dosed in mg/24 hours) patch 14 mg  14 mg Transdermal Daily Alyson Kuroski-Mazzei, DO   14 mg at 10/31/11 0948  . ondansetron (ZOFRAN-ODT) disintegrating tablet 4 mg  4 mg Oral Q6H PRN Jorje Guild, PA      . QUEtiapine (SEROQUEL) tablet 200 mg  200 mg Oral QAC breakfast Alyson Kuroski-Mazzei, DO   200 mg at 10/31/11 0914  . QUEtiapine (SEROQUEL) tablet 600 mg  600 mg Oral QHS Verne Spurr, PA   600 mg at 10/30/11 2213  . thiamine (VITAMIN B-1) tablet 100 mg  100 mg Oral Daily Jorje Guild, [place]  100 mg at 10/31/11 0802  . DISCONTD: QUEtiapine (SEROQUEL) tablet 200 mg  200 mg Oral QAC breakfast Verne Spurr, [place]      . DISCONTD: QUEtiapine (SEROQUEL) tablet 200 mg  200 mg Oral QAC breakfast Verne Spurr, PA   200 mg at 10/30/11 1906    Lab Results:  Results for orders placed during the hospital encounter of 10/29/11 (from the past 48 hour(s))  URINALYSIS, ROUTINE W REFLEX MICROSCOPIC     Status: Normal   Collection Time   10/29/11  9:55 PM      Component Value Range Comment   Color, Urine YELLOW  YELLOW     APPearance CLEAR  CLEAR     Specific Gravity, Urine 1.015  1.005 - 1.030     pH 6.0  5.0 - 8.0     Glucose, UA NEGATIVE  NEGATIVE (mg/dL)    Hgb urine dipstick NEGATIVE  NEGATIVE     Bilirubin Urine NEGATIVE  NEGATIVE     Ketones, ur NEGATIVE  NEGATIVE (mg/dL)    Protein, ur NEGATIVE  NEGATIVE (mg/dL)    Urobilinogen, UA 1.0  0.0 - 1.0 (mg/dL)    Nitrite NEGATIVE  NEGATIVE     Leukocytes, UA NEGATIVE  NEGATIVE  MICROSCOPIC NOT DONE ON URINES WITH  NEGATIVE PROTEIN, BLOOD, LEUKOCYTES, NITRITE, OR GLUCOSE <1000 mg/dL.  URINE RAPID DRUG SCREEN (HOSP PERFORMED)     Status: Abnormal   Collection Time   10/29/11  9:55 PM      Component Value Range Comment   Opiates NONE DETECTED  NONE DETECTED     Cocaine NONE DETECTED  NONE DETECTED     Benzodiazepines NONE DETECTED  NONE DETECTED     Amphetamines NONE DETECTED  NONE DETECTED     Tetrahydrocannabinol POSITIVE (*) NONE DETECTED     Barbiturates NONE DETECTED  NONE DETECTED    CBC     Status: Normal   Collection Time   10/29/11 10:25 PM      Component Value Range Comment   WBC 7.6  4.0 - 10.5 (K/uL)    RBC 5.15  4.22 - 5.81 (MIL/uL)    Hemoglobin 15.8  13.0 - 17.0 (g/dL)    HCT 91.4  78.2 - 95.6 (%)    MCV 88.9  78.0 - 100.0 (fL)    MCH 30.7  26.0 - 34.0 (pg)    MCHC 34.5  30.0 - 36.0 (g/dL)    RDW 21.3  08.6 - 57.8 (%)    Platelets 365  150 - 400 (K/uL)   COMPREHENSIVE METABOLIC PANEL     Status: Abnormal   Collection Time   10/29/11 10:25 PM      Component Value Range Comment   Sodium 143  135 - 145 (mEq/L)    Potassium 3.2 (*) 3.5 - 5.1 (mEq/L)    Chloride 102  96 - 112 (mEq/L)    CO2 28  19 - 32 (mEq/L)    Glucose, Bld 101 (*) 70 - 99 (mg/dL)    BUN 10  6 - 23 (mg/dL)    Creatinine, Ser 4.69  0.50 - 1.35 (mg/dL)    Calcium 9.2  8.4 - 10.5 (mg/dL)    Total Protein 7.3  6.0 - 8.3 (g/dL)    Albumin 4.1  3.5 - 5.2 (g/dL)    AST 25  0 - 37 (U/L)    ALT 27  0 - 53 (U/L)    Alkaline Phosphatase 101  39 - 117 (U/L)  Total Bilirubin 0.3  0.3 - 1.2 (mg/dL)    GFR calc non Af Amer >90  >90 (mL/min)    GFR calc Af Amer >90  >90 (mL/min)   ETHANOL     Status: Abnormal   Collection Time   10/29/11 10:25 PM      Component Value Range Comment   Alcohol, Ethyl (B) 67 (*) 0 - 11 (mg/dL)   ACETAMINOPHEN LEVEL     Status: Normal   Collection Time   10/29/11 10:25 PM      Component Value Range Comment   Acetaminophen (Tylenol), Serum <15.0  10 - 30 (ug/mL)   SALICYLATE LEVEL      Status: Abnormal   Collection Time   10/29/11 10:25 PM      Component Value Range Comment   Salicylate Lvl <2.0 (*) 2.8 - 20.0 (mg/dL)     Physical Findings: AIMS:  , ,  ,  ,    CIWA:  CIWA-Ar Total: 4  COWS:     Treatment Plan Summary: Daily contact with patient to assess and evaluate symptoms and progress in treatment Medication management  Plan: Continue current plan of care.  Mohamud Mrozek 10/31/2011, 10:13 AM

## 2011-10-31 NOTE — Progress Notes (Signed)
10/31/2011 Nursing 0900 D Brooke is seen OOB UAL on the 300 hall this AM..he is disheveled. HE is flat and blunted and makes poor eye contact, looking down while speaking to this nurse. HE is soft spoken. SHy.Able  to articulate his feelings that he is uncomfortable, he needs a nicotine patch and that he wants to make sure he gets his  AM seroquel 200 mg ( " the voices won't go away...they're always there..."). A He is compliant with his meds. HE completed his self inventory and on it he wrote he is actively having self harm thoughts, but verbally contracted with  This writer to stay safe and seek staff help if he felt like he couldn't, he rated his depression and hopelessness " 9 / 9 " and stated his DC plan includes " hopefully I will get assistance until I can take care of myslef". R Safety is maintained and POC includes fostering therapeutic report already established with pt PD RN [facility]

## 2011-10-31 NOTE — Progress Notes (Signed)
[facility] Family/Significant Other Suicide Prevention Education  Suicide Prevention Education:  Education Completed; pt's brother Oluwatomisin Deman at [phone] has been identified by the patient as the family member/significant other with whom the patient will be residing, and identified as the person(s) who will aid the patient in the event of a mental health crisis (suicidal ideations/suicide attempt).  With written consent from the patient, the family member/significant other has been provided the following suicide prevention education, prior to the and/or following the discharge of the patient.  The suicide prevention education provided includes the following:  Suicide risk factors  Suicide prevention and interventions  National Suicide Hotline telephone number  [facility] assessment telephone number  [facility] Emergency Assistance 911  [facility] and/or Residential Mobile Crisis Unit telephone number  Request made of family/significant other to:  Remove weapons (e.g., guns, rifles, knives), all items previously/currently identified as safety concern.    Remove drugs/medications (over-the-counter, prescriptions, illicit drugs), all items previously/currently identified as a safety concern.  The family member/significant other verbalizes understanding of the suicide prevention education information provided.  The family member/significant other agrees to remove the items of safety concern listed above.  Rebeckah Masih L 10/31/2011, 10:45 AM

## 2011-10-31 NOTE — Progress Notes (Signed)
[facility] Group Notes:  (Counselor/Nursing/MHT/Case Management/Adjunct)  10/31/2011 3:16 PM  Type of Therapy:  1:15PM Group Therapy  Participation Level:  Active  Participation Quality:  Appropriate, Attentive and Sharing  Affect:  Blunted  Cognitive:  Alert and Appropriate  Insight:  Good  Engagement in Group:  Good  Engagement in Therapy:  Good  Modes of Intervention:  Clarification, Education, Support and Exploration  Summary of Progress/Problems: Patient reported he does not like to feel emotions. When he thinks os rational he thinks of "Dr. Julianne Handler" because rational is based on facts. Patient states he has a hard time being able to think in the "wise mind" perspective. Patient stated his getting divorced was an irrational decision he made in the past. Patient seemed to relate well with his peers in discussing emotional and rational thoughts.   Wilmon Arms 10/31/2011, 3:16 PM

## 2011-11-01 DIAGNOSIS — F329 Major depressive disorder, single episode, unspecified: Secondary | ICD-10-CM

## 2011-11-01 DIAGNOSIS — F3289 Other specified depressive episodes: Secondary | ICD-10-CM

## 2011-11-01 DIAGNOSIS — R45851 Suicidal ideations: Secondary | ICD-10-CM

## 2011-11-01 MED ORDER — CHLORPROMAZINE HCL 25 MG PO TABS
25.0000 mg | ORAL_TABLET | Freq: Once | ORAL | Status: AC
  Administered 2011-11-01: 25 mg via ORAL
  Filled 2011-11-01: qty 1

## 2011-11-01 MED ORDER — CHLORPROMAZINE HCL 25 MG PO TABS
25.0000 mg | ORAL_TABLET | Freq: Three times a day (TID) | ORAL | Status: DC
  Administered 2011-11-01 – 2011-11-02 (×2): 25 mg via ORAL
  Filled 2011-11-01 (×9): qty 1

## 2011-11-01 NOTE — Progress Notes (Signed)
Patient ID: [name], male   DOB: 1971/07/25, 41 y.o.   MRN: [id number]   [facility] Group Notes:  (Counselor/Nursing/MHT/Case Management/Adjunct)  11/01/2011 1:15 PM  Type of Therapy:  Group Therapy, Dance/Movement Therapy   Participation Level:  Active  Participation Quality:  Appropriate  Affect:  Anxious  Cognitive:  Oriented  Insight:  Limited  Engagement in Group:  Good  Engagement in Therapy:  Good  Modes of Intervention:  Clarification, Problem-solving, Role-play, Socialization and Support  Summary of Progress/Problems:  Group discussed triggers and obstacles in their recovery and brainstormed ways to deal with them. Pt shared that he was feeling stressed, but he did not feel comfortable in group to disclose. Pt indicated having difficulty developing strategies to deal with triggers, which is hindering their progress from precontemplation in recovery.  Thomasena Edis, [employer]

## 2011-11-01 NOTE — Progress Notes (Signed)
[facility] MD Progress Note  11/01/2011 1:23 PM  Diagnosis:   Axis I: See current hospital problem list Axis II: Deferred Axis III:  Past Medical History  Diagnosis Date  . Depression   . Psychosis   . PTSD (post-traumatic stress disorder)   . Seizure disorder     related to etoh seizure  . Alcohol abuse   . Schizoaffective disorder    Axis IV: Unchanged Axis V: 21-30 behavior considerably influenced by delusions or hallucinations OR serious impairment in judgment, communication OR inability to function in almost all areas  ADL's:  Intact  Sleep: Good  Appetite:  Good  Suicidal Ideation:  Plan:  "I can figure something out" Intent:  not currently Means:  "I have cut my wrists with a Kleenex box in the past" Homicidal Ideation:  None  Subjective: [name] endorses that he continues to have significant auditory and visual, as well as tactile hallucinations. He reports that he has been on Thorazine in the past, and that helped when used in conjunction with Seroquel. He denies any cravings for alcohol, and states that he doesn't even like to drink, but does so when he runs out of his medication. He feels that he could benefit more from the groups on the mood disorders hall. He endorses suicidal thoughts and states that he could devise a plan, but has no intention at this time. He denies any homicidal intent.his sleep and appetite are good  AEB (as evidenced by):  Mental Status Examination/Evaluation: Objective:  Appearance: Casual and Guarded  Eye Contact::  Fair  Speech:  Clear and Coherent  Volume:  Normal  Mood:  Anxious and Hopeless  Affect:  Constricted  Thought Process:  Circumstantial and Coherent  Orientation:  Full  Thought Content:  Hallucinations: Auditory Command:  to harm self Tactile Visual  Suicidal Thoughts:  Yes.  with intent/plan  Homicidal Thoughts:  No  Memory:  Remote;   Good  Judgement:  Good  Insight:  Lacking  Psychomotor Activity:  Decreased    Concentration:  Good  Recall:  Good  Akathisia:  No  Handed:    AIMS (if indicated):     Assets:  Communication Skills Desire for Improvement Physical Health  Sleep:  Number of Hours: 6.25    Vital Signs:Blood pressure 131/85, pulse 84, temperature 97.4 F (36.3 C), temperature source Oral, resp. rate 18, height 5' 11.5" (1.816 m), weight 99.791 kg (220 lb). Current Medications: Current Facility-Administered Medications  Medication Dose Route Frequency Provider Last Rate Last Dose  . acetaminophen (TYLENOL) tablet 650 mg  650 mg Oral Q6H PRN Jorje Guild, PA      . alum & mag hydroxide-simeth (MAALOX/MYLANTA) 200-200-20 MG/5ML suspension 30 mL  30 mL Oral Q4H PRN Jorje Guild, PA      . carbamazepine (TEGRETOL XR) 12 hr tablet 200 mg  200 mg Oral BID Jorje Guild, PA   200 mg at 11/01/11 0810  . chlordiazePOXIDE (LIBRIUM) capsule 25 mg  25 mg Oral Q6H PRN Jorje Guild, PA   25 mg at 10/31/11 2148  . chlordiazePOXIDE (LIBRIUM) capsule 25 mg  25 mg Oral TID Jorje Guild, PA   25 mg at 10/31/11 1643   Followed by  . chlordiazePOXIDE (LIBRIUM) capsule 25 mg  25 mg Oral Lawerance Bach, [place]   25 mg at 11/01/11 0810   Followed by  . chlordiazePOXIDE (LIBRIUM) capsule 25 mg  25 mg Oral Daily Jorje Guild, PA      . chlorproMAZINE (THORAZINE)  tablet 25 mg  25 mg Oral TID Jorje Guild, PA      . hydrOXYzine (ATARAX/VISTARIL) tablet 25 mg  25 mg Oral Q6H PRN Jorje Guild, PA   25 mg at 10/31/11 1902  . loperamide (IMODIUM) capsule 2-4 mg  2-4 mg Oral PRN Jorje Guild, PA      . magnesium hydroxide (MILK OF MAGNESIA) suspension 30 mL  30 mL Oral Daily PRN Jorje Guild, PA      . mulitivitamin with minerals tablet 1 tablet  1 tablet Oral Daily Jorje Guild, [place]   1 tablet at 11/01/11 0810  . nicotine (NICODERM CQ - dosed in mg/24 hours) patch 14 mg  14 mg Transdermal Daily Alyson Kuroski-Mazzei, DO   14 mg at 10/31/11 0948  . ondansetron (ZOFRAN-ODT) disintegrating tablet 4 mg  4 mg Oral Q6H PRN Jorje Guild, PA      . QUEtiapine  (SEROQUEL) tablet 200 mg  200 mg Oral QAC breakfast Alyson Kuroski-Mazzei, DO   200 mg at 11/01/11 0645  . QUEtiapine (SEROQUEL) tablet 600 mg  600 mg Oral QHS Verne Spurr, PA   600 mg at 10/31/11 2148  . thiamine (VITAMIN B-1) tablet 100 mg  100 mg Oral Daily Jorje Guild, PA   100 mg at 11/01/11 5784    Lab Results: No results found for this or any previous visit (from the past 48 hour(s)).  Physical Findings: AIMS:  , ,  ,  ,    CIWA:  CIWA-Ar Total: 5  COWS:     Treatment Plan Summary: Daily contact with patient to assess and evaluate symptoms and progress in treatment Medication management  Plan: Per his request we will start Thorazine at a low dose to target the psychotic symptoms. We will continue his detox, and program him on the [address] per his request.  [name] 11/01/2011, 1:23 PM

## 2011-11-01 NOTE — Progress Notes (Signed)
Slept fair last nite, appetite is improving, energy level is low and ability to pay attention is improving, depressed 9/10 and hopeless 9/10, has constant thoughts of SI but contracts from safety, taking meds as ordered by MD, continues to hear voices, denies HI. q81min safety checks continue and support offered Safety maintained

## 2011-11-01 NOTE — Progress Notes (Signed)
Patient ID: [name], male   DOB: [birth year], 41 y.o.   MRN: [id number] Pt. attended and participated in aftercare planning group. Pt. accepted information on suicide prevention, warning signs to look for with suicide and crisis line numbers to use. The pt. agreed to call crisis line numbers if having warning signs or having thoughts of suicide. Pt. listed their current anxiety level as 9 and depression as 9 on a scale of 1 to 10 with 10 being the high.  Therapist spoke with patient individually.  Patient said,"I am hearing voices of God and the devil. The devil tells me to hurt myself.  I also see shadow people".  Pt. Stated that " the shadow people are out to get me."  Pt. informed therapist that sometimes he knows that the voices are not real but at times the voices get loud and it really annoys him which makes it hard not to believe in the voices. Therapist processed with patient the warning signs of suicide and the difference between having thoughts of suicide vs. acting out suicide. Patient  informed therapist  that he knows how to contract for safety.

## 2011-11-01 NOTE — Progress Notes (Signed)
Pt reported that he wished to take his Thorazine before group tonight due to anxiety, also reports some pain in his calves and head.  Flat affect but brightens on approach.  Denies SI/HI/hallucinations at present.  Support and encouragement offered, will continue to monitor.
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
## 2011-11-02 MED ORDER — NICOTINE 21 MG/24HR TD PT24
21.0000 mg | MEDICATED_PATCH | Freq: Every day | TRANSDERMAL | Status: DC
  Administered 2011-11-03 – 2011-11-20 (×19): 21 mg via TRANSDERMAL
  Filled 2011-11-02 (×21): qty 1

## 2011-11-02 MED ORDER — CHLORPROMAZINE HCL 50 MG PO TABS
50.0000 mg | ORAL_TABLET | Freq: Three times a day (TID) | ORAL | Status: DC
  Administered 2011-11-02 – 2011-11-03 (×4): 50 mg via ORAL
  Filled 2011-11-02 (×6): qty 1

## 2011-11-02 NOTE — Progress Notes (Signed)
Pt brightens on approach, no complaints voiced.  Concerned over his brother or sister coming and picking up a key that he has in his locker.  He thought they would come today but apparently they did not.  Pt assured that we can, with his permission, give the key to his family anytime that they come to pick it up.  Denies HI/hallucinations but does endorse SI at times.  Pt does contract for safety on the unit.  Support and encouragement offered, will continue to monitor.

## 2011-11-02 NOTE — Progress Notes (Signed)
CIWA discontinued, has been less than 4 for 48 hours

## 2011-11-02 NOTE — Progress Notes (Signed)
Patient ID: [name], male   DOB: [birth year], 41 y.o.   MRN: [id number]  [facility] Group Notes:  (Counselor/Nursing/MHT/Case Management/Adjunct)  11/02/2011 1:15 PM  Type of Therapy:  Group Therapy, Dance/Movement Therapy   Participation Level:  Minimal  Participation Quality:  Supportive  Affect:  Depressed  Cognitive:  Oriented  Insight:  Limited  Engagement in Group:  Limited  Engagement in Therapy:  Limited  Modes of Intervention:  Clarification, Problem-solving, Role-play, Socialization and Support  Summary of Progress/Problems:Therapist discussed with group the meaning of healthy supports and what healthy support looks like to them.  Group processed what they would do if family and friends don't support them. Therapist ended group with the question, "If someone ruins your day, whose fault is it and why" for group to process on an individual basis the remainder of the day.     [name]

## 2011-11-02 NOTE — Progress Notes (Signed)
[facility] MD Progress Note  11/02/2011 11:53 AM  Diagnosis:   Axis I: See current hospital problem list Axis II: Deferred Axis III:  Past Medical History  Diagnosis Date  . Depression   . Psychosis   . PTSD (post-traumatic stress disorder)   . Seizure disorder     related to etoh seizure  . Alcohol abuse   . Schizoaffective disorder    Axis IV: Unchanged Axis V: 11-20 some danger of hurting self or others possible OR occasionally fails to maintain minimal personal hygiene OR gross impairment in communication  ADL's:  Intact  Sleep: Good  Appetite:  Good  Suicidal Ideation:  Plan:  to cut wrist with razor Intent:  yes Means:  at home Homicidal Ideation:  None  Subjective:  [name] continues to endorse A & V hallucinations, as well as SI to cut his wrist with a razor that he has at his hotel room.  He denies any side effects to the Thorazine started yesterday.  He requested a larger nicotine patch.  AEB (as evidenced by):  Mental Status Examination/Evaluation: Objective:  Appearance: Casual and Guarded  Eye Contact::  Fair  Speech:  Clear and Coherent  Volume:  Normal  Mood:  Anxious, Depressed and Hopeless  Affect:  Blunt, Congruent and Constricted  Thought Process:  Coherent and Linear  Orientation:  Full  Thought Content:  Hallucinations: Auditory Visual  Suicidal Thoughts:  Yes.  with intent/plan  Homicidal Thoughts:  No  Memory:  Remote;   Good  Judgement:  Fair  Insight:  Fair  Psychomotor Activity:  Psychomotor Retardation  Concentration:  Good  Recall:  Good  Akathisia:  No  Handed:    AIMS (if indicated):     Assets:  Communication Skills Desire for Improvement Physical Health  Sleep:  Number of Hours: 5.75    Vital Signs:Blood pressure 142/91, pulse 89, temperature 96.5 F (35.8 C), temperature source Oral, resp. rate 20, height 5' 11.5" (1.816 m), weight 99.791 kg (220 lb). Current Medications: Current Facility-Administered Medications    Medication Dose Route Frequency Provider Last Rate Last Dose  . acetaminophen (TYLENOL) tablet 650 mg  650 mg Oral Q6H PRN Jorje Guild, PA   650 mg at 11/01/11 1951  . alum & mag hydroxide-simeth (MAALOX/MYLANTA) 200-200-20 MG/5ML suspension 30 mL  30 mL Oral Q4H PRN Jorje Guild, PA      . carbamazepine (TEGRETOL XR) 12 hr tablet 200 mg  200 mg Oral BID Jorje Guild, PA   200 mg at 11/02/11 5784  . chlordiazePOXIDE (LIBRIUM) capsule 25 mg  25 mg Oral Q6H PRN Jorje Guild, PA   25 mg at 11/01/11 1816  . chlordiazePOXIDE (LIBRIUM) capsule 25 mg  25 mg Oral BH-qamhs Jorje Guild, [place]   25 mg at 11/01/11 2133   Followed by  . chlordiazePOXIDE (LIBRIUM) capsule 25 mg  25 mg Oral Daily Jorje Guild, [place]   25 mg at 11/02/11 6962  . chlorproMAZINE (THORAZINE) tablet 25 mg  25 mg Oral Once [employer], DO   25 mg at 11/01/11 1952  . chlorproMAZINE (THORAZINE) tablet 50 mg  50 mg Oral TID Jorje Guild, PA      . hydrOXYzine (ATARAX/VISTARIL) tablet 25 mg  25 mg Oral Q6H PRN Jorje Guild, PA   25 mg at 11/01/11 2133  . loperamide (IMODIUM) capsule 2-4 mg  2-4 mg Oral PRN Jorje Guild, PA      . magnesium hydroxide (MILK OF MAGNESIA) suspension 30 mL  30 mL  Oral Daily PRN Jorje Guild, PA      . mulitivitamin with minerals tablet 1 tablet  1 tablet Oral Daily Jorje Guild, [place]   1 tablet at 11/02/11 [id number]  . nicotine (NICODERM CQ - dosed in mg/24 hours) patch 21 mg  21 mg Transdermal Daily Jorje Guild, PA      . ondansetron (ZOFRAN-ODT) disintegrating tablet 4 mg  4 mg Oral Q6H PRN Jorje Guild, PA      . QUEtiapine (SEROQUEL) tablet 200 mg  200 mg Oral QAC breakfast Alyson Kuroski-Mazzei, DO   200 mg at 11/02/11 0605  . QUEtiapine (SEROQUEL) tablet 600 mg  600 mg Oral QHS Verne Spurr, PA   600 mg at 11/01/11 2133  . thiamine (VITAMIN B-1) tablet 100 mg  100 mg Oral Daily Jorje Guild, PA   100 mg at 11/02/11 9604  . DISCONTD: chlorproMAZINE (THORAZINE) tablet 25 mg  25 mg Oral TID Jorje Guild, PA   25 mg at 11/02/11 5409  . DISCONTD:  nicotine (NICODERM CQ - dosed in mg/24 hours) patch 14 mg  14 mg Transdermal Daily Alyson Kuroski-Mazzei, DO   14 mg at 11/02/11 8119    Lab Results: No results found for this or any previous visit (from the past 48 hour(s)).  Physical Findings: AIMS:  , ,  ,  ,    CIWA:  CIWA-Ar Total: 1  COWS:     Treatment Plan Summary: Daily contact with patient to assess and evaluate symptoms and progress in treatment Medication management  Plan: We will increase his Thorazine to 50mg  tid to target AVH.  Nicoderm will be increased to 21mg .  [name] 11/02/2011, 11:53 AM

## 2011-11-02 NOTE — Progress Notes (Signed)
Pt has been up and has been active and participating in various milieu activities today, pt did endorse depression and suicidal thoughts and able to contract for safety on the unit, pt states he feels his medications are helping, pt has received all medications without incident, support provided, will continue to monitor

## 2011-11-02 NOTE — Progress Notes (Signed)
Patient ID: [name], male   DOB: [birth year], 41 y.o.   MRN: [id number] Pt. attended and participated in aftercare planning group. Pt. accepted information on suicide prevention, warning signs to look for with suicide and crisis line numbers to use. The pt. agreed to call crisis line numbers if having warning signs or having thoughts of suicide. Pt. listed their current anxiety level as high and depression level as high.  Pt. stated that " I am still have suicidal thoughts and would still have those thoughts when release.  I still hear voices and believe that I should be on another hall because my issue right now is not alcohol or substance abuse".  Therapist processed with patient on the difference between the act of suicide and having thoughts.  Patient stated that "It's mostly thoughts right now".  Patient knows how to contract for safety.

## 2011-11-03 DIAGNOSIS — F4310 Post-traumatic stress disorder, unspecified: Secondary | ICD-10-CM | POA: Diagnosis present

## 2011-11-03 DIAGNOSIS — F1020 Alcohol dependence, uncomplicated: Secondary | ICD-10-CM | POA: Diagnosis present

## 2011-11-03 LAB — CARBAMAZEPINE LEVEL, TOTAL: Carbamazepine Lvl: 6.9 ug/mL (ref 4.0–12.0)

## 2011-11-03 LAB — COMPREHENSIVE METABOLIC PANEL
ALT: 51 U/L (ref 0–53)
AST: 58 U/L — ABNORMAL HIGH (ref 0–37)
Albumin: 4.2 g/dL (ref 3.5–5.2)
Alkaline Phosphatase: 105 U/L (ref 39–117)
BUN: 16 mg/dL (ref 6–23)
CO2: 29 mEq/L (ref 19–32)
Calcium: 10 mg/dL (ref 8.4–10.5)
Chloride: 100 mEq/L (ref 96–112)
Creatinine, Ser: 1.24 mg/dL (ref 0.50–1.35)
GFR calc Af Amer: 83 mL/min — ABNORMAL LOW (ref >90)
GFR calc non Af Amer: 72 mL/min — ABNORMAL LOW (ref >90)
Glucose, Bld: 75 mg/dL (ref 70–99)
Potassium: 4.9 mEq/L (ref 3.5–5.1)
Sodium: 140 mEq/L (ref 135–145)
Total Bilirubin: 0.2 mg/dL — ABNORMAL LOW (ref 0.3–1.2)
Total Protein: 7.2 g/dL (ref 6.0–8.3)

## 2011-11-03 LAB — DIFFERENTIAL
Basophils Absolute: 0 10*3/uL (ref 0.0–0.1)
Basophils Relative: 1 % (ref 0–1)
Eosinophils Absolute: 0.1 10*3/uL (ref 0.0–0.7)
Eosinophils Relative: 1 % (ref 0–5)
Lymphocytes Relative: 33 % (ref 12–46)
Lymphs Abs: 2 10*3/uL (ref 0.7–4.0)
Monocytes Absolute: 0.4 10*3/uL (ref 0.1–1.0)
Monocytes Relative: 7 % (ref 3–12)
Neutro Abs: 3.5 10*3/uL (ref 1.7–7.7)
Neutrophils Relative %: 58 % (ref 43–77)

## 2011-11-03 LAB — CBC
HCT: 42.1 % (ref 39.0–52.0)
Hemoglobin: 14 g/dL (ref 13.0–17.0)
MCH: 30.2 pg (ref 26.0–34.0)
MCHC: 33.3 g/dL (ref 30.0–36.0)
MCV: 90.7 fL (ref 78.0–100.0)
Platelets: 276 10*3/uL (ref 150–400)
RBC: 4.64 MIL/uL (ref 4.22–5.81)
RDW: 13.3 % (ref 11.5–15.5)
WBC: 6 10*3/uL (ref 4.0–10.5)

## 2011-11-03 MED ORDER — POTASSIUM CHLORIDE CRYS ER 10 MEQ PO TBCR: 10.0000 meq | EXTENDED_RELEASE_TABLET | Freq: Two times a day (BID) | ORAL | Status: DC

## 2011-11-03 MED ORDER — CARBAMAZEPINE ER 200 MG PO TB12
200.0000 mg | ORAL_TABLET | ORAL | Status: DC
  Administered 2011-11-03 – 2011-11-21 (×36): 200 mg via ORAL
  Filled 2011-11-03 (×15): qty 1
  Filled 2011-11-03: qty 28
  Filled 2011-11-03 (×4): qty 1
  Filled 2011-11-03: qty 28
  Filled 2011-11-03 (×14): qty 1
  Filled 2011-11-03: qty 28
  Filled 2011-11-03 (×5): qty 1
  Filled 2011-11-03: qty 28

## 2011-11-03 MED ORDER — POTASSIUM CHLORIDE CRYS ER 10 MEQ PO TBCR
10.0000 meq | EXTENDED_RELEASE_TABLET | Freq: Two times a day (BID) | ORAL | Status: DC
  Administered 2011-11-03 – 2011-11-21 (×36): 10 meq via ORAL
  Filled 2011-11-03: qty 1
  Filled 2011-11-03: qty 28
  Filled 2011-11-03 (×9): qty 1
  Filled 2011-11-03: qty 28
  Filled 2011-11-03 (×14): qty 1
  Filled 2011-11-03: qty 28
  Filled 2011-11-03 (×13): qty 1
  Filled 2011-11-03: qty 28
  Filled 2011-11-03 (×4): qty 1

## 2011-11-03 MED ORDER — CHLORPROMAZINE HCL 100 MG PO TABS
100.0000 mg | ORAL_TABLET | ORAL | Status: DC
  Administered 2011-11-03 – 2011-11-05 (×6): 100 mg via ORAL
  Filled 2011-11-03 (×11): qty 1

## 2011-11-03 MED ORDER — CHLORPROMAZINE HCL 100 MG PO TABS
100.0000 mg | ORAL_TABLET | Freq: Three times a day (TID) | ORAL | Status: DC
  Filled 2011-11-03 (×5): qty 1

## 2011-11-03 NOTE — Progress Notes (Signed)
Patient ID: [name], male   DOB: 10-16-1970, 41 y.o.   MRN: [id number] Pt. Reports day "pretty rough." Pt. Reports AH, "voices tell me to hurt myself, or threaten to hurt me." Pt. Contracts for safety. Pt. Attends group and participates. Pt. Reports anxiety "8-9", staff will continue to monitor q20min for safety.

## 2011-11-03 NOTE — Progress Notes (Signed)
Patient ID: [name], male   DOB: 07/11/1971, 41 y.o.   MRN: [id number] Pt's requested and received a 'hotel room key' from pt locker to give to his brother who plans to use key to retrieve pt's belongings out of the hotel room per pt. Request. This was approved by Thurman Coyer, RN,  administrative coordinator.  Pt. Cont. To verbalize suicidal thoughts and states he is "safe while hospitalized", but has potential plans to "use razors" to harm self if "outside the hospital". Affect remains flat and depressed.  Cont. To acknowledge auditory hallucinations.  Pt. Contracts for safety at this time, and pt. Cont. On q 15 min. Observations for safety and remains safe at this time.

## 2011-11-03 NOTE — Progress Notes (Signed)
[facility] MD Progress Note  11/03/2011 2:06 PM  S/O: Patient seen and evaluated. Chart reviewed. Patient stated that his mood was "ok". His affect was mood congruent and constricted. He denied any current thoughts of self injurious behavior, suicidal ideation or homicidal ideation. C/o depressive s.s and AH that are self deprecating in nature even on current meds. There were no visual hallucinations, paranoia, delusional thought processes, or mania noted. Thought process was linear and goal directed. His speech was normal rate, tone and volume. Eye contact was good. Judgment and insight are fair. Patient has been up and engaged on the unit. No acute safety concerns reported from team.  Interested in transition to [address] [place].  Discussed with Dr. Allena Katz.   Sleep:  Number of Hours: 5.75    Vital Signs:Blood pressure 124/80, pulse 106, temperature 98.6 F (37 C), temperature source Oral, resp. rate 16, height 5' 11.5" (1.816 m), weight 99.791 kg (220 lb).  Repeat manuel vitals: 138/90 with HR 80 & 88.   Current Medications:    . carbamazepine  200 mg Oral BID  . chlorproMAZINE  50 mg Oral TID  . mulitivitamin with minerals  1 tablet Oral Daily  . nicotine  21 mg Transdermal Daily  . QUEtiapine  200 mg Oral QAC breakfast  . QUEtiapine  600 mg Oral QHS  . thiamine  100 mg Oral Daily    Lab Results: No results found for this or any previous visit (from the past 48 hour(s)).  Physical Findings: CIWA:  CIWA-Ar Total: 1   A/P: Schizoaffective Disorder, PTSD and GAD, per Hx; Alcohol Dependence; Cannabis Abuse   -Will increase thorazine to 100mg  tid for c/o continued psychotic s/s. -Pt requesting [address].  Meds discussed with Dr. Allena Katz who accepted him for transfer once bed becomes available. -Medication education completed.  Pros, cons, risks, potential side effects and benefits discussed with pt.  Pt agreeable with the plan.  See orders.  Discussed with team.  Pt reportedly on more than one  antipsychotic in past with reported efficacy on combination of both Seroquel and Thorazine.  Treatment goals and medication management reviewed with patient.  Continue current treatment plan and medications as noted.  No SEs reported at current dosages.  No acute medical issues noted.  Pt agreeable with plan.  Discussed with team.  Lupe Carney 11/03/2011, 2:06 PM

## 2011-11-03 NOTE — Discharge Planning (Signed)
This AM in group [name] c/o auditory hallucinations and requested transfer to [address] "where I can get the tx I need and I can relate to others."  He went on to minimize substance use and insist that he be transferred due to on-going AH.  I pointed out [address] is not an option, and [address] is where thought disorder patients live.  He responded that would be fine, "as long as I can get off this hall."  Also wanted more info about ACT team.  Answered his questions.  Referral was sent to [facility] on Fri.  Told him I would make Dr aware of his request to move.
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
## 2011-11-04 DIAGNOSIS — F4310 Post-traumatic stress disorder, unspecified: Secondary | ICD-10-CM

## 2011-11-04 DIAGNOSIS — F1020 Alcohol dependence, uncomplicated: Secondary | ICD-10-CM

## 2011-11-04 DIAGNOSIS — F259 Schizoaffective disorder, unspecified: Principal | ICD-10-CM

## 2011-11-04 MED ORDER — PRAZOSIN HCL 1 MG PO CAPS
1.0000 mg | ORAL_CAPSULE | Freq: Every day | ORAL | Status: DC
  Administered 2011-11-04 – 2011-11-19 (×16): 1 mg via ORAL
  Filled 2011-11-04 (×17): qty 1

## 2011-11-04 NOTE — Progress Notes (Signed)
Pt. Went outside with the group.Pt. Continues to have thoughts of self-harm, but contracts for safety on the unit.Pt. Is positive for AVH.Affect is blunted & mood sad.Pleasant & cooperative. Continues on 15 minute checks. Pt. Safety maintained.

## 2011-11-04 NOTE — Progress Notes (Signed)
[facility] MD Progress Note  11/04/2011 12:53 PM  Diagnosis:  Axis I: Schizoaffective Disorder - Depressed Type. Posttraumatic Stress Disorder. Alcohol Dependence.  The patient was seen today and reports the following:   ADL's: Intact.  Sleep: The patient reports to having difficulty initiating and maintaining sleep due to seeing "shadow people" in his room.  Appetite: The patient reports a good appetite today.   Mild>(1-10) >Severe  Hopelessness (1-10): 8  Depression (1-10): 5  Anxiety (1-10): 9  Suicidal Ideation: The patient reports "constant" suicidal ideations today but with no plan or intent.  Plan: No  Intent: No  Means: No  Homicidal Ideation: The patient denies any homicidal ideations today.  Plan: No  Intent: No.  Means: No   General Appearance/Behavior: Friendly and cooperative today.  Eye Contact: Good.  Speech: Appropriate in rate and volume with no pressuring of speech noted.  Motor Behavior: wnl.  Level of Consciousness: Alert and Oriented x 3.  Mental Status: Alert and Oriented x 3.  Mood: Moderately depressed today.  Affect: Moderately constricted today.  Anxiety Level: Severe anxiety reported.  Thought Process: residual psychosis continues. Thought Content: The patient reports to hearing the voices of "Jesus and Lucifer" threatening to harm him.  He also reports to seeing "shadow people" at night who he also states is afraid will harm him.  He denies any delusional thinking.  Perception: wnl.  Judgment: Fair.  Insight: Fair.  Cognition: Oriented to time, place and person.  Sleep:  Number of Hours: 6.5    Vital Signs:Blood pressure 136/89, pulse 98, temperature 97.4 F (36.3 C), temperature source Oral, resp. rate 18, height 5' 11.5" (1.816 m), weight 99.791 kg (220 lb).  Current Medications: Current Facility-Administered Medications  Medication Dose Route Frequency Provider Last Rate Last Dose  . acetaminophen (TYLENOL) tablet 650 mg  650 mg Oral Q6H PRN  Jorje Guild, PA   650 mg at 11/02/11 1156  . alum & mag hydroxide-simeth (MAALOX/MYLANTA) 200-200-20 MG/5ML suspension 30 mL  30 mL Oral Q4H PRN Jorje Guild, PA      . carbamazepine (TEGRETOL XR) 12 hr tablet 200 mg  200 mg Oral BH-qamhs Curlene Labrum Talisa Petrak, MD   200 mg at 11/04/11 0747  . chlorproMAZINE (THORAZINE) tablet 100 mg  100 mg Oral [id number] Curlene Labrum Dynasti Kerman, MD   100 mg at 11/04/11 0753  . magnesium hydroxide (MILK OF MAGNESIA) suspension 30 mL  30 mL Oral Daily PRN Jorje Guild, PA      . mulitivitamin with minerals tablet 1 tablet  1 tablet Oral Daily Curlene Labrum Wandra Babin, MD   1 tablet at 11/04/11 0747  . nicotine (NICODERM CQ - dosed in mg/24 hours) patch 21 mg  21 mg Transdermal Daily Curlene Labrum Slyvia Lartigue, MD   21 mg at 11/04/11 0800  . potassium chloride (K-DUR,KLOR-CON) CR tablet 10 mEq  10 mEq Oral BID Curlene Labrum Marnita Poirier, MD   10 mEq at 11/04/11 0747  . prazosin (MINIPRESS) capsule 1 mg  1 mg Oral QHS Curlene Labrum Rhyli Depaula, MD      . QUEtiapine (SEROQUEL) tablet 200 mg  200 mg Oral QAC breakfast Alyson Kuroski-Mazzei, DO   200 mg at 11/04/11 1478  . QUEtiapine (SEROQUEL) tablet 600 mg  600 mg Oral QHS Verne Spurr, PA   600 mg at 11/03/11 2134  . thiamine (VITAMIN B-1) tablet 100 mg  100 mg Oral Daily Curlene Labrum Severn Goddard, MD   100 mg at 11/04/11 0747  . DISCONTD: carbamazepine (TEGRETOL XR)  12 hr tablet 200 mg  200 mg Oral BID Jorje Guild, PA   200 mg at 11/03/11 1610  . DISCONTD: chlorproMAZINE (THORAZINE) tablet 100 mg  100 mg Oral TID Alyson Kuroski-Mazzei, DO      . DISCONTD: chlorproMAZINE (THORAZINE) tablet 50 mg  50 mg Oral TID Jorje Guild, PA   50 mg at 11/03/11 1153  . DISCONTD: potassium chloride (K-DUR,KLOR-CON) CR tablet 10 mEq  10 mEq Oral BID Ronny Bacon, MD       Lab Results:  Results for orders placed during the hospital encounter of 10/30/11 (from the past 48 hour(s))  COMPREHENSIVE METABOLIC PANEL     Status: Abnormal   Collection Time   11/03/11  7:48 PM      Component Value Range  Comment   Sodium 140  135 - 145 (mEq/L)    Potassium 4.9  3.5 - 5.1 (mEq/L)    Chloride 100  96 - 112 (mEq/L)    CO2 29  19 - 32 (mEq/L)    Glucose, Bld 75  70 - 99 (mg/dL)    BUN 16  6 - 23 (mg/dL)    Creatinine, Ser 9.60  0.50 - 1.35 (mg/dL)    Calcium 45.4  8.4 - 10.5 (mg/dL)    Total Protein 7.2  6.0 - 8.3 (g/dL)    Albumin 4.2  3.5 - 5.2 (g/dL)    AST 58 (*) 0 - 37 (U/L)    ALT 51  0 - 53 (U/L)    Alkaline Phosphatase 105  39 - 117 (U/L)    Total Bilirubin 0.2 (*) 0.3 - 1.2 (mg/dL)    GFR calc non Af Amer 72 (*) >90 (mL/min)    GFR calc Af Amer 83 (*) >90 (mL/min)   CBC     Status: Normal   Collection Time   11/03/11  7:48 PM      Component Value Range Comment   WBC 6.0  4.0 - 10.5 (K/uL)    RBC 4.64  4.22 - 5.81 (MIL/uL)    Hemoglobin 14.0  13.0 - 17.0 (g/dL)    HCT 09.8  11.9 - 14.7 (%)    MCV 90.7  78.0 - 100.0 (fL)    MCH 30.2  26.0 - 34.0 (pg)    MCHC 33.3  30.0 - 36.0 (g/dL)    RDW 82.9  56.2 - 13.0 (%)    Platelets 276  150 - 400 (K/uL)   DIFFERENTIAL     Status: Normal   Collection Time   11/03/11  7:48 PM      Component Value Range Comment   Neutrophils Relative 58  43 - 77 (%)    Neutro Abs 3.5  1.7 - 7.7 (K/uL)    Lymphocytes Relative 33  12 - 46 (%)    Lymphs Abs 2.0  0.7 - 4.0 (K/uL)    Monocytes Relative 7  3 - 12 (%)    Monocytes Absolute 0.4  0.1 - 1.0 (K/uL)    Eosinophils Relative 1  0 - 5 (%)    Eosinophils Absolute 0.1  0.0 - 0.7 (K/uL)    Basophils Relative 1  0 - 1 (%)    Basophils Absolute 0.0  0.0 - 0.1 (K/uL)   CARBAMAZEPINE LEVEL, TOTAL     Status: Normal   Collection Time   11/03/11  7:48 PM      Component Value Range Comment   Carbamazepine Lvl 6.9  4.0 - 12.0 (ug/mL)  Time was spent today discussing with the patient his current symptoms.  The patient reports similar symptoms as during his last admission.  He states that he is continually suicidal but with no plan or intent today.  He reports to seeing "shadow people" at night who he  fears will harm him.  He also reports to hearing the voices of "Jesus and Lucifer" who both are threatening to harm him.    Treatment Plan Summary:  1. Daily contact with patient to assess and evaluate symptoms and progress in treatment  2. Medication management  3. The patient will deny suicidal ideations or homicidal ideations for 48 hours prior to discharge and have a depression and anxiety rating of 3 or less. The patient will also deny any auditory or visual hallucinations or delusional thinking.  4. The patient will deny any symptoms of substance withdrawal at time of discharge.  Plan:  1. Will continue the patient on his current medications.  2. Will start the patient on the medication Prazosin 1 mg po qhs for nightmares and flashbacks. 3. Will continue to monitor.  4. Laboratory studies reviewed.  5. Probable referral to [facility] for longer term treatment.  Mani Celestin 11/04/2011, 12:53 PM

## 2011-11-04 NOTE — Progress Notes (Signed)
Pt's affect is flat and depressed. He reports voices to harm self with constant si thoughts. Offered support, encouragement, medication and 15 minute checks. Pt contracts with staff for safety.

## 2011-11-04 NOTE — Progress Notes (Signed)
Currently resting quietly in bed with eyes closed. Respirations are even and unlabored. No acute distress noted. Safety has been maintained with Q15 minute observation. Will continue current POC. 

## 2011-11-04 NOTE — Tx Team (Signed)
Interdisciplinary Treatment Plan Update (Adult)  Date:  11/04/2011  Time Reviewed:  10:15AM-11:00AM  Progress in Treatment: Attending groups:  Yes, although new to hall Participating in groups:    Yes Taking medication as prescribed:    Yes Tolerating medication:   Yes Family/Significant other contact made:  No Patient understands diagnosis:   Yes, although needs more observation to fully assess Discussing patient identified problems/goals with staff:  Yes  Medical problems stabilized or resolved:   New on our hall Denies suicidal/homicidal ideation:  Constant SI but contracts for safety Issues/concerns per patient self-inventory:   None Other:    New problem(s) identified: Yes, Describe:  Anxiety very high  Reason for Continuation of Hospitalization: Anxiety Depression Hallucinations Medication stabilization Suicidal ideation  Interventions implemented related to continuation of hospitalization:  Medication monitoring and adjustment, safety checks Q15 min., suicide risk assessment, group therapy, psychoeducation, collateral contact, aftercare planning, ongoing physician assessments, medication education  Additional comments:  Not applicable  Estimated length of stay:  5-7 days  Discharge Plan:  Refer to [facility] for now  New goal(s):  Reduce anxiety from "9" on 3/19 to no more than 3 at discharge.  Review of initial/current patient goals per problem list:   1.  Goal(s):  Safely detox from alcohol.  Met:  Yes  Target date:  By Discharge   As evidenced by:  Not on any detox protocol at this time  2.  Goal(s):  Eliminate SI  Met:  No  Target date:  By Discharge   As evidenced by:  Constantly suicidal but does contract for safety on the unit  3.  Goal(s):  Eliminate psychosis  Met:  No  Target date:  By Discharge   As evidenced by:  Hears voices saying "we'll skin you alive", sees "shadow people" all the time, talks about seeing both Lucifer and Jesus who are both  out to hurt him  4.  Goal(s):  Decrease depression.  Met:  No  Target date:  By Discharge   As evidenced by:  Patient's depression still reported at 5 now.  Attendees: Patient:  Did not attend   Family:     Physician:  Dr. Harvie Heck Readling 11/04/2011 10:15AM-11:00AM  Nursing:   Waynetta Sandy, RN 11/04/2011 10:15AM -11:00AM   Case Manager:  Ambrose Mantle, LCSW 11/04/2011 10:15AM-11:00AM  Counselor:  Veto Kemps, MT-BC 11/04/2011 10:15AM-11:00AM  Other:   Lynann Bologna, NP 11/04/2011 10:15AM-11:00AM  Other:      Other:      Other:       Scribe for Treatment Team:   Sarina Ser, 11/04/2011, 10:15AM-11:15AM

## 2011-11-04 NOTE — Progress Notes (Signed)
[facility] Group Notes:  (Counselor/Nursing/MHT/Case Management/Adjunct)  11/04/2011 4:08 PM  Type of Therapy:  Group Therapy  Participation Level:  Active  Participation Quality:  Attentive and Sharing  Affect:  Depressed  Cognitive:  Hallucinating  Insight:  Limited  Engagement in Group:  Good  Engagement in Therapy:  Good  Modes of Intervention:  Support and Exploration  Summary of Progress/Problems: Patient stated that he felt like he was being attacked. He talked about the voices of God and Lucifer and being hunted by the shadow people. Stated that he has had these thoughts for years but sometimes when on medications they decrease. He stops taking medications because he gets tired of them or he doesn't feel he needs them anymore. Talked about his mother supporting him by putting him up in a motel.    Magali Bray, [name] 11/04/2011, 4:08 PM

## 2011-11-05 MED ORDER — CHLORPROMAZINE HCL 100 MG PO TABS
100.0000 mg | ORAL_TABLET | Freq: Three times a day (TID) | ORAL | Status: DC
  Administered 2011-11-05 – 2011-11-10 (×20): 100 mg via ORAL
  Filled 2011-11-05 (×24): qty 1

## 2011-11-05 MED ORDER — BENZTROPINE MESYLATE 0.5 MG PO TABS
0.5000 mg | ORAL_TABLET | Freq: Three times a day (TID) | ORAL | Status: DC
  Administered 2011-11-05 – 2011-11-21 (×64): 0.5 mg via ORAL
  Filled 2011-11-05 (×8): qty 1
  Filled 2011-11-05: qty 56
  Filled 2011-11-05 (×12): qty 1
  Filled 2011-11-05: qty 56
  Filled 2011-11-05 (×15): qty 1
  Filled 2011-11-05: qty 56
  Filled 2011-11-05 (×5): qty 1
  Filled 2011-11-05: qty 56
  Filled 2011-11-05 (×3): qty 1
  Filled 2011-11-05: qty 56
  Filled 2011-11-05 (×4): qty 1
  Filled 2011-11-05: qty 56
  Filled 2011-11-05 (×15): qty 1
  Filled 2011-11-05: qty 56
  Filled 2011-11-05 (×6): qty 1
  Filled 2011-11-05: qty 56
  Filled 2011-11-05 (×3): qty 1

## 2011-11-05 NOTE — Progress Notes (Signed)
Patient ID: [name], male   DOB: 04-Nov-1970, 41 y.o.   MRN: [id number] Pt. Awake, alert, NAD.  Appropriately groomed and dressed.  Affect: flat, Mood: empty.  Speech somewhat halting but will respond appropriately when approached.  Reviewed nursing care plan and offered support.

## 2011-11-05 NOTE — Progress Notes (Signed)
Patient ID: [name], male   DOB: [birth year], 41 y.o.   MRN: [id number] He has been  Up and about and to groups. Interacting with peers and staff. He is Positive for SI, contracts for safety. Self inventory: sleep poor, appetite  Good, energy low, attention   Improving, depression and hopeless at 8, .

## 2011-11-05 NOTE — Progress Notes (Signed)
Resting quietly in bed with eyes closed. Respirations even and unlabored. No acute distress noted. Safety has been maintained with Q15 minute observation. Will continue current POC. 

## 2011-11-05 NOTE — Progress Notes (Addendum)
Patient ID: [name], male   DOB: 1971/07/04, 41 y.o.   MRN: [id number] [name] presents pleasant on approach, with a blunt affect. Fully alert and adequately groomed. Cooperative, and pleasant. He reports he did not sleep well last night says that sometimes his auditory hallucinations keep him awake. Says he has trouble initiating sleep and staying asleep. Ambien has helped with this in the past. He is also reading that his roommate was sleep walking several times per night.   He also feels that he gets a little "edgy" around the middle of the day and feels it would be better if he could have his medications spread out and dosed a little differently. He says having Thorazine several times a day has helped him in the past.  Today he rates his anxiety at 9/10 on a 1-10 scale if 10 is the worst symptoms. He reports depression 6/10 and reports that the voices come and go, they're voices of Jesus and Lucifer who say that they want to skin him alive, also that they're going to give him the stigmata, and they also threatened him with sexual torture. Complains of feelings of worthlessness and hopelessness.   He is having suicidal thoughts, and says he can be safe on the unit.   Plan: I have medically and physically evaluated this patient and my findings are consistent with those of the emergency room.  Will increase Thorazine to 100mg  TID and HS.

## 2011-11-05 NOTE — Progress Notes (Signed)
Patient ID: [name], male   DOB: 03/18/1971, 41 y.o.   MRN: [id number]

## 2011-11-05 NOTE — Progress Notes (Signed)
11/05/2011         Time: 0930      Group Topic/Focus: The focus of this group is on enhancing the patient's understanding of leisure, barriers to leisure, and the importance of engaging in positive leisure activities upon discharge for improved total health.  Participation Level: Active  Participation Quality: Appropriate and Attentive  Affect: Appropriate  Cognitive: Oriented   Additional Comments: Patient able to identify positive leisure activities to engage in at discharge.   [name] 11/05/2011 10:06 AM

## 2011-11-05 NOTE — Discharge Planning (Signed)
Met with patient in Aftercare Planning Group.   He is interested in CST or ACTT, and has an appointment for [facility] to do an assessment on 3/26 here at the hospital.  Told patient also is being placed on the [facility] wait list, and he stated he prefers not to go there, hopes he gets better first.  Still working on referral.  Ambrose Mantle, LCSW 11/05/2011, 3:51 PM

## 2011-11-06 MED ORDER — SERTRALINE HCL 50 MG PO TABS
50.0000 mg | ORAL_TABLET | Freq: Every day | ORAL | Status: DC
  Administered 2011-11-06 – 2011-11-11 (×6): 50 mg via ORAL
  Filled 2011-11-06 (×7): qty 1

## 2011-11-06 MED ORDER — CLONAZEPAM 0.5 MG PO TABS
0.5000 mg | ORAL_TABLET | Freq: Three times a day (TID) | ORAL | Status: DC
  Administered 2011-11-06 – 2011-11-07 (×4): 0.5 mg via ORAL
  Filled 2011-11-06 (×5): qty 1

## 2011-11-06 NOTE — Progress Notes (Signed)
[facility] MD Progress Note  11/06/2011 11:38 AM  Diagnosis:  Axis I: Schizoaffective Disorder - Depressed Type.  Posttraumatic Stress Disorder.  Alcohol Dependence.   The patient was seen today and reports the following:   ADL's: Intact.  Sleep: The patient reports to having ongoing difficulty initiating and maintaining sleep due to seeing "shadow people" in his room.  Appetite: The patient reports a good appetite today.   Mild>(1-10) >Severe  Hopelessness (1-10): 6  Depression (1-10): 6-7  Anxiety (1-10): 8   Suicidal Ideation: The patient reports "on and off" suicidal ideations today but with no plan or intent.  Plan: No  Intent: No  Means: No  Homicidal Ideation: The patient denies any homicidal ideations today.  Plan: No  Intent: No.  Means: No   General Appearance/Behavior: Remains friendly and cooperative today.  Eye Contact: Good.  Speech: Appropriate in rate and volume with no pressuring of speech noted.  Motor Behavior: wnl.  Level of Consciousness: Alert and Oriented x 3.  Mental Status: Alert and Oriented x 3.  Mood: Moderately depressed today.  Affect: Moderately constricted today.  Anxiety Level: Severe anxiety reported.  Thought Process: residual psychosis continues.  Thought Content: The patient reports to hearing the voices "on and off" threatening to harm him. He also reports to seeing "shadow people" at night who he also states is afraid will harm him. He denies any delusional thinking.  Perception: wnl.  Judgment: Fair.  Insight: Fair.  Cognition: Oriented to time, place and person.  Sleep:  Number of Hours: 6.75    Vital Signs:Blood pressure 91/64, pulse 103, temperature 98 F (36.7 C), temperature source Oral, resp. rate 18, height 5' 11.5" (1.816 m), weight 99.791 kg (220 lb).  Current Medications: Current Facility-Administered Medications  Medication Dose Route Frequency Provider Last Rate Last Dose  . acetaminophen (TYLENOL) tablet 650 mg  650 mg  Oral Q6H PRN Jorje Guild, PA-C   650 mg at 11/02/11 1156  . alum & mag hydroxide-simeth (MAALOX/MYLANTA) 200-200-20 MG/5ML suspension 30 mL  30 mL Oral Q4H PRN Jorje Guild, PA-C   30 mL at 11/06/11 0918  . benztropine (COGENTIN) tablet 0.5 mg  0.5 mg Oral TID WC & HS Curlene Labrum Trachelle Low, MD   0.5 mg at 11/06/11 0814  . carbamazepine (TEGRETOL XR) 12 hr tablet 200 mg  200 mg Oral BH-qamhs Curlene Labrum Jsiah Menta, MD   200 mg at 11/06/11 0814  . chlorproMAZINE (THORAZINE) tablet 100 mg  100 mg Oral TID WC & HS Curlene Labrum Debroah Shuttleworth, MD   100 mg at 11/06/11 0814  . clonazePAM (KLONOPIN) tablet 0.5 mg  0.5 mg Oral TID WC & HS Maximillion Gill D Kazuto Sevey, MD      . magnesium hydroxide (MILK OF MAGNESIA) suspension 30 mL  30 mL Oral Daily PRN Jorje Guild, PA-C      . mulitivitamin with minerals tablet 1 tablet  1 tablet Oral Daily Curlene Labrum Shakeera Rightmyer, MD   1 tablet at 11/06/11 0814  . nicotine (NICODERM CQ - dosed in mg/24 hours) patch 21 mg  21 mg Transdermal Daily Curlene Labrum Emmanual Gauthreaux, MD   21 mg at 11/06/11 0817  . potassium chloride (K-DUR,KLOR-CON) CR tablet 10 mEq  10 mEq Oral BID Curlene Labrum Lexa Coronado, MD   10 mEq at 11/06/11 0814  . prazosin (MINIPRESS) capsule 1 mg  1 mg Oral QHS Curlene Labrum Savaya Hakes, MD   1 mg at 11/05/11 2132  . QUEtiapine (SEROQUEL) tablet 200 mg  200 mg Oral  QAC breakfast Alyson Kuroski-Mazzei, DO   200 mg at 11/06/11 0641  . QUEtiapine (SEROQUEL) tablet 600 mg  600 mg Oral QHS Verne Spurr, PA-C   600 mg at 11/05/11 2132  . sertraline (ZOLOFT) tablet 50 mg  50 mg Oral Daily Curlene Labrum Jaevian Shean, MD      . thiamine (VITAMIN B-1) tablet 100 mg  100 mg Oral Daily Curlene Labrum Tunis Gentle, MD   100 mg at 11/06/11 0813  . DISCONTD: chlorproMAZINE (THORAZINE) tablet 100 mg  100 mg Oral [id number] Curlene Labrum Liandra Mendia, MD   100 mg at 11/05/11 1419   Lab Results: No results found for this or any previous visit (from the past 48 hour(s)).  Time was spent today discussing with the patient his current symptoms. The patient states that he  continues to experience significant anxiety symptoms as well as depression and reports that his auditory hallucinations are worse in the afternoon and evening hours.  He also reports ongoing moderate depression and episodic suicidal ideations without plan or intent.  The patient does state today that he feels his Mother may help him pay for a hotel room at time of discharge.  Treatment Plan Summary:  1. Daily contact with patient to assess and evaluate symptoms and progress in treatment  2. Medication management  3. The patient will deny suicidal ideations or homicidal ideations for 48 hours prior to discharge and have a depression and anxiety rating of 3 or less. The patient will also deny any auditory or visual hallucinations or delusional thinking.  4. The patient will deny any symptoms of substance withdrawal at time of discharge.   Plan:  1. Will continue the patient on his current medications.  2. Will continue the medication Thorazine at 100 mgs po q 8 am, 12 noon, 5 pm and hs for his psychotic symptoms. 3. Will start Klonopin at 0.5 mgs po q 8 am, 12 noon, 5 pm and hs for sleep and anxiety.  4. Will start Zoloft 50 mgs po q am for depression and anxiety. 5. Will continue to monitor.  6. Laboratory studies reviewed.  7. The patient has been referred to [facility] for longer term treatment and is on the waiting list.  Montreal Steidle 11/06/2011, 11:38 AM

## 2011-11-06 NOTE — Progress Notes (Signed)
[facility] Group Notes:  (Counselor/Nursing/MHT/Case Management/Adjunct)  11/06/2011 12:47 PM  Type of Therapy:  Group Therapy  Participation Level:  Minimal  Participation Quality:  Attentive  Affect:  Depressed  Cognitive:  Oriented  Insight:  Limited  Engagement in Group:  Limited  Engagement in Therapy:  Limited  Modes of Intervention:  Education and Support  Summary of Progress/Problems: Patient was quiet but attentive   Corderro Koloski, [name] 11/06/2011, 12:47 PM

## 2011-11-06 NOTE — Progress Notes (Signed)
[facility] Group Notes:  (Counselor/Nursing/MHT/Case Management/Adjunct)  11/06/2011 8:12 AM  Type of Therapy:  Group Therapy 11/05/11  Participation Level:  Active  Participation Quality:  Attentive and Sharing  Affect:  Depressed  Cognitive:  Oriented  Insight:  Limited  Engagement in Group:  Good  Engagement in Therapy:  Good  Modes of Intervention:  Education and Support  Summary of Progress/Problems: Patient related to another peer as he talked about his anxiety. Reported his anxiety as being high due to the voices. Described where he woke up and the shadow people were actually pushing down on him.   [name], [name] 11/06/2011, 8:12 AM

## 2011-11-06 NOTE — Progress Notes (Signed)
Has been up and pacing the halls much of the day.  Very little interaction with peers.  Started on Zoloft and Klonopin this morning.  Tolerating both well thus far.  Affect remains flat.  Initiates minimal conversation.  Continues to endorse frequent suicidal thoughts with no intent or plan.  Agrees to seek out staff if feeling unsafe.  Appetite is improved, but he states sleep is still poor.

## 2011-11-06 NOTE — Discharge Planning (Signed)
Met with patient in Aftercare Planning Group.   Discussed importance of using support groups as part of wellness & recovery action plan.  Patient reports he is feeling better, and does not believe he will be in hospital long enough to go to [facility], at least hopes so.  [facility] referral completed once authorization number was received from [facility].  Awaiting word re placement on wait list.  No other case management needs today.  Awaiting results of yesterday's utilization review.  Ambrose Mantle, LCSW 11/06/2011, 11:28 AM

## 2011-11-06 NOTE — Progress Notes (Signed)
Pt is depressed and has an anguished expression on his face  He reports attending and participating in groups  He complains of not sleeping well  Pt has been sitting in the dayroom wathching the basketball tournament   He appears anxious and a little guarded   He continues to endorse voices and said that is part of his anxiety and fear   Verbal support given  Medications administered and effectiveness monitored   Q 15 min checks  Pt safe at present

## 2011-11-07 MED ORDER — SALINE SPRAY 0.65 % NA SOLN
2.0000 | Freq: Three times a day (TID) | NASAL | Status: DC
  Administered 2011-11-07 – 2011-11-21 (×47): 2 via NASAL
  Filled 2011-11-07 (×2): qty 44

## 2011-11-07 MED ORDER — CLONAZEPAM 1 MG PO TABS
1.0000 mg | ORAL_TABLET | Freq: Every day | ORAL | Status: DC
  Administered 2011-11-07 – 2011-11-13 (×7): 1 mg via ORAL
  Filled 2011-11-07 (×7): qty 1

## 2011-11-07 MED ORDER — CLONAZEPAM 0.5 MG PO TABS
0.5000 mg | ORAL_TABLET | Freq: Three times a day (TID) | ORAL | Status: DC
  Administered 2011-11-07 – 2011-11-14 (×22): 0.5 mg via ORAL
  Filled 2011-11-07 (×21): qty 1

## 2011-11-07 NOTE — Progress Notes (Signed)
Patient ID: [name], male   DOB: 02-Jul-1971, 41 y.o.   MRN: [id number] Pt is awake and active on the unit this AM. Pt is attending groups and is cooperative with staff. Pt denies HI and VH but endorses passive SI and AH. Pt is able to contract for safety. Pt is pacing in the halls but is interacting well in the milieu. Pt states that he is very depressed about the death of his fiancee,but he forwards little on the topic. Pt also c/o HA, dry sinuses and heartburn. Medications administered. Writer will continue to monitor.

## 2011-11-07 NOTE — Discharge Planning (Signed)
Met with patient in Aftercare Planning Group.   No case management needs today, although he continues to say that he hopes to not have to go to [facility].  During Aftercare Planning Group, Case Manager provided psychoeducation on "Suicide Prevention Information."  This included descriptions of risk factors for suicide, warning signs that an individual is in crisis and thinking of suicide, and what to do if this occurs.  Pt indicated understanding of information provided, and will read brochure given upon discharge.   Discussion was lively and patient was one of the leaders.  During this discussion, patient spoke up and said that he wished he had this information 3 years ago.  He stated that his then-fiancee was exhibiting some of these warning signs, and that he did not know that's what they were.  Two days later she killed herself in front of him.  He feels guilty and responsible that he did not recognize it and do something.  Supportive counseling was provided briefly, with focus on future.  Ambrose Mantle, LCSW 11/07/2011, 3:28 PM

## 2011-11-07 NOTE — Progress Notes (Signed)
[facility] Group Notes:  (Counselor/Nursing/MHT/Case Management/Adjunct)  11/07/2011 1:21 PM 9:30Group  Type of Therapy:  Group Therapy  Participation Level:  Minimal  Participation Quality:  Appropriate  Affect:  Flat  Cognitive:  Alert and Oriented  Insight:  Limited  Engagement in Group:  Limited  Engagement in Therapy:  Limited  Modes of Intervention:  Clarification, Problem-solving, Support and Exploratory  Summary of Progress/Problems: Pt stated that he felt as though isolating himself from others caused him to relapse. Pt stated that he is going to try to stop isolating himself from other as much and turn to his brothers for support. Pt verbalized that his brothers were his best friend. Pt stated that him and his brother can both relate to the same issue because he is going through depression as well.  Bibiana Gillean, Randal Buba 11/07/2011, 1:21 PM

## 2011-11-07 NOTE — Progress Notes (Signed)
Patient ID: [name], male   DOB: 1971-02-10, 40 y.o.   MRN: [id number]  Markeis presents fully alert, calm, appropriately dressed with adequate grooming. Pleasant and cooperative and polite. He is complaining of feeling that his sinuses are very dried out and starting to give him a headache, but no complaints of fever, chills, or nasal discharge.  He is still having problems initiating and sustaining sleep at night. Says that after he receives his process and, Seroquel, and Klonopin, it takes him an hour and a half to fall asleep, and then he awakens after an hour and a half. This is accompanied by her continued anxiety level but he rates the 9-10 out of 10. He rates his depression is 6-7/10 and hopelessness as 6/10. He describes himself as helpless "I can't even take care of myself".  He reports that the auditory hallucinations are worse at night for him, mainly because he has no way to cope with them. During the day he is distracted by daily activities and activities around him, but not with things quiet down he is faced with the voices. Typically at home he would fall asleep with the television on as a focal point. He reports daily suicidal thoughts "I find myself looking around for sharp objects.", But then he says he can be safe on the unit and has no desire to really hurt himself.  His mother lives in [place] and he has not contemplated going to live with her there since she is already living with a friend. He is hoping that she will come to live in [place].  Plan: At [facility] nasal spray, 2 sprays each nostril 4 times a day, for dry membranes. Increase Klonopin to 1 mg at at bedtime and maintain other doses the same. Continue other medications as written.

## 2011-11-07 NOTE — Tx Team (Signed)
Interdisciplinary Treatment Plan Update (Adult)  Date:  11/07/2011  Time Reviewed:  10:15AM-11:00AM  Progress in Treatment: Attending groups:  Yes Participating in groups:    Yes Taking medication as prescribed:    Yes Tolerating medication:  Yes  Family/Significant other contact made:  No Patient understands diagnosis:   Yes Discussing patient identified problems/goals with staff:   Yes Medical problems stabilized or resolved:   Yes Denies suicidal/homicidal ideation:  No Issues/concerns per patient self-inventory:   None Other:    New problem(s) identified: Yes, Describe:  still not sleeping  Reason for Continuation of Hospitalization: Depression Medication stabilization Suicidal ideation Other; describe not sleeping  Interventions implemented related to continuation of hospitalization:  Medication monitoring and adjustment, safety checks Q15 min., suicide risk assessment, group therapy, psychoeducation, collateral contact, aftercare planning, ongoing physician assessments, medication education  Additional comments:  Not applicable  Estimated length of stay:  4-5 days  Discharge Plan:  Is on [facility] wait list, but thinks may go before that to a hotel.  New goal(s):  Be able to sleep at least 5-6 hours per night.  Review of initial/current patient goals per problem list:   1.  Goal(s):  Previously met      2.  Goal(s):  Deny SI for 48 hours prior to D/C.  Met:  No  Target date:  By Discharge   As evidenced by:  Looks around for sharps, but can commit to safety on the unit  3.  Goal(s):  Reduce psychosis to baseline.  Met:  No  Target date:  By Discharge   As evidenced by:  Still having auditory and visual hallucinations, worse at night  4.  Goal(s):  Decrease depression from admission level to no more than 3 at discharge.  Met:  No  Target date:  By Discharge   As evidenced by:  At "6-7" today   5.  Goal(s):  Reduce anxiety from "9" on 3/19 to no more  than 3 at discharge.  Met:  No  Target date:  By Discharge   As evidenced by: "9-10" today   Attendees: Patient:     Family:     Physician:  Dr. Harvie Heck Readling 11/07/2011 10:15AM-11:00AM  Nursing:      Case Manager:  Ambrose Mantle, LCSW 11/07/2011 10:15AM-11:00AM  Counselor:  Veto Kemps, MT-BC 11/07/2011 10:15AM-11:00AM  Other:     Other:      Other:      Other:       Scribe for Treatment Team:   Sarina Ser, 11/07/2011, 10:15AM-11:15AM

## 2011-11-07 NOTE — Progress Notes (Signed)
Pt has been up and has been visible and active in the milieu today, pt does endorse some suicidal thoughts but is able contract for safety, pt wanting to hopefully be discharged to hotel instead of state hospital, pt has received all medications without incident, support provided, will continue to monitor

## 2011-11-08 NOTE — Progress Notes (Signed)
Pt is depressed and anxious  He continues to endorse voices and he isolates to his room   He is cooperative and pleasant  His interactions with others is minimal but appropriate  His hygeine is fair  He attends most groups with some participation  His appetite is good but reports poor sleep and frequent awakenings  Verbal support given  Medications administered and effectiveness monitored  Q 15 min checks  Pt safe at present

## 2011-11-08 NOTE — Progress Notes (Signed)
Writer observed patient sitting in the dayroom watching tv, no interactioin with peers. Wrier introduced self as Librarian, academic and informed him of his medication. Writer inquired about his day and pt. reported that I'm still here. Patient c/o having a headache and tylenol was received. Patient denies hi but reports passive si in which he verbally contracts. Patient reports + voices but did not elaborate when asked. Safety maintained on unit, will continue to monitor.

## 2011-11-08 NOTE — Progress Notes (Signed)
Patient ID: Longino Trefz, male   DOB: 1971-01-18, 41 y.o.   MRN: [id number]  [facility] Group Notes:  (Counselor/Nursing/MHT/Case Management/Adjunct)  11/08/2011 11 AM  Type of Therapy:  Group Therapy, Dance/Movement Therapy   Participation Level:  Active  Participation Quality:  Appropriate  Affect:  Depressed  Cognitive:  Oriented  Insight:  Limited  Engagement in Group:  Good  Engagement in Therapy:  Good  Modes of Intervention:  Clarification, Problem-solving, Role-play, Socialization and Support  Summary of Progress/Problems:  Pt shared that he was feeling like the color black because he was "emotionally flat-lined." Group experienced how movement can shift them from indulging in self sabotaging and enabling behaviors to having motivation and a positive outlook in order to be successful in recovery. Pt was fully engaged in the group process and periodically displayed a smile. Pt seems to benefit from external encouragement but he lacks the ability to provoke sense of internal support needed to transition from precontemplation.  Thomasena Edis, [employer]

## 2011-11-08 NOTE — Progress Notes (Signed)
Patient ID: [name], male   DOB: 1970/10/12, 41 y.o.   MRN: [id number]  Pt. attended and participated in aftercare planning group. Pt admitted to S/I; case manager verbally contracted for safety with pt. Pt. listed their current anxiety level as 9 or 10 and his depression at a 7. Pt has plans to meet with [facility] on Tuesday. He is still worried about finding housing and is waiting for his SSDI to start so that he has the funds to pay for housing.

## 2011-11-09 MED ORDER — BIOTENE DRY MOUTH MT LIQD
15.0000 mL | OROMUCOSAL | Status: DC
  Administered 2011-11-09 – 2011-11-11 (×5): 15 mL via OROMUCOSAL
  Filled 2011-11-09 (×7): qty 15

## 2011-11-09 MED ORDER — QUETIAPINE FUMARATE 50 MG PO TABS
50.0000 mg | ORAL_TABLET | Freq: Two times a day (BID) | ORAL | Status: DC | PRN
  Administered 2011-11-09: 50 mg via ORAL
  Filled 2011-11-09: qty 1

## 2011-11-09 NOTE — Progress Notes (Signed)
[facility] MD Progress Note  11/09/2011 5:09 PM  Diagnosis:  Schizoaffective disorder depressive type  ADL's:  Intact  Sleep: Fair still complains of poor sleep, but also continues to nap during the day. Appetite:  Good  Suicidal Ideation:  Plan:  "none today." Intent:  none reported Means:  none reported. Pt. States he has a little less SI today and notes that it continues to decrease. Homicidal Ideation:  denies  AEB (as evidenced by): Subjective:  Jarquis is requesting more Biotene for his dry mouth as well as something for the "internal anxiety" he has. Mental Status Examination/Evaluation: Objective:  Appearance: Casual  Eye Contact::  Good  Speech:  Normal Rate  Volume:  Normal  Mood:  Euthymic  Affect:  Congruent  Thought Process:  Coherent  Orientation:  Full  Thought Content:  WDL  Suicidal Thoughts:  Yes.  without intent/plan  Homicidal Thoughts:  No  Memory:  Immediate;   Fair  Judgement:  Fair  Insight:  Fair  Psychomotor Activity:  Normal  Concentration:  Fair  Recall:  Fair  Akathisia:  No  Handed:    AIMS (if indicated):     Assets:  Communication Skills Desire for Improvement  Sleep:  Number of Hours: 5.5    Vital Signs:Blood pressure 98/63, pulse 102, temperature 97.3 F (36.3 C), temperature source Oral, resp. rate 20, height 5' 11.5" (1.816 m), weight 99.791 kg (220 lb). Current Medications: Current Facility-Administered Medications  Medication Dose Route Frequency Provider Last Rate Last Dose  . acetaminophen (TYLENOL) tablet 650 mg  650 mg Oral Q6H PRN Jorje Guild, PA-C   650 mg at 11/08/11 1951  . alum & mag hydroxide-simeth (MAALOX/MYLANTA) 200-200-20 MG/5ML suspension 30 mL  30 mL Oral Q4H PRN Jorje Guild, PA-C   30 mL at 11/08/11 1040  . antiseptic oral rinse (BIOTENE) solution 15 mL  15 mL Mouth Rinse BH-qamhs Curlene Labrum Readling, MD   15 mL at 11/09/11 1301  . benztropine (COGENTIN) tablet 0.5 mg  0.5 mg Oral TID WC & HS Curlene Labrum Readling, MD   0.5 mg  at 11/09/11 1705  . carbamazepine (TEGRETOL XR) 12 hr tablet 200 mg  200 mg Oral BH-qamhs Curlene Labrum Readling, MD   200 mg at 11/09/11 0750  . chlorproMAZINE (THORAZINE) tablet 100 mg  100 mg Oral TID WC & HS Curlene Labrum Readling, MD   100 mg at 11/09/11 1705  . clonazePAM (KLONOPIN) tablet 0.5 mg  0.5 mg Oral TID WC Viviann Spare, NP   0.5 mg at 11/09/11 1705  . clonazePAM (KLONOPIN) tablet 1 mg  1 mg Oral QHS Viviann Spare, NP   1 mg at 11/08/11 2112  . magnesium hydroxide (MILK OF MAGNESIA) suspension 30 mL  30 mL Oral Daily PRN Jorje Guild, PA-C      . mulitivitamin with minerals tablet 1 tablet  1 tablet Oral Daily Ronny Bacon, MD   1 tablet at 11/09/11 0750  . nicotine (NICODERM CQ - dosed in mg/24 hours) patch 21 mg  21 mg Transdermal Daily Curlene Labrum Readling, MD   21 mg at 11/09/11 0749  . potassium chloride (K-DUR,KLOR-CON) CR tablet 10 mEq  10 mEq Oral BID Curlene Labrum Readling, MD   10 mEq at 11/09/11 1704  . prazosin (MINIPRESS) capsule 1 mg  1 mg Oral QHS Curlene Labrum Readling, MD   1 mg at 11/08/11 2112  . QUEtiapine (SEROQUEL) tablet 200 mg  200 mg Oral QAC breakfast Alyson  Kuroski-Mazzei, DO   200 mg at 11/09/11 1610  . QUEtiapine (SEROQUEL) tablet 600 mg  600 mg Oral QHS Verne Spurr, PA-C   600 mg at 11/08/11 2112  . sertraline (ZOLOFT) tablet 50 mg  50 mg Oral Daily Curlene Labrum Readling, MD   50 mg at 11/09/11 0749  . sodium chloride (OCEAN) 0.65 % nasal spray 2 spray  2 spray Each Nare TID WC & HS Viviann Spare, NP   2 spray at 11/09/11 1704  . thiamine (VITAMIN B-1) tablet 100 mg  100 mg Oral Daily Curlene Labrum Readling, MD   100 mg at 11/09/11 9604    Lab Results: No results found for this or any previous visit (from the past 48 hour(s)).  Physical Findings: AIMS:  , ,  ,  ,    CIWA:  CIWA-Ar Total: 0  COWS:     Treatment Plan Summary: Daily contact with patient to assess and evaluate symptoms and progress in treatment Medication management  Plan:  Due to patient's reports of  anxiety during the day, will offer low dose of Seroquel to use for anxiety.  Will order 50mg  to use BID prn for anxiety. Faylinn Schwenn 11/09/2011, 5:09 PM

## 2011-11-09 NOTE — Progress Notes (Signed)
Pt is pleasant and cooperative  He continues to complain of not sleeping well and dry mouth   He did not attend spirituality group this morning  He endorses voices and is very depressed  He interacts with select peers  Pt reports his appetite is good  Verbal support given  Pt encouraged to sleep less naps during the day   Medications administered and effectiveness monitored  Q 15 min checks  Pt safe at present

## 2011-11-09 NOTE — Progress Notes (Signed)
Patient ID: [name], male   DOB: Oct 10, 1970, 41 y.o.   MRN: [id number]  Pt. attended and participated in aftercare planning group. Pt. accepted information on suicide prevention, warning signs to look for with suicide and crisis line numbers to use. The pt. agreed to call crisis line numbers if having warning signs or having thoughts of suicide. Pt. listed their current anxiety level as 9 due to ruminating thoughts and a 7 because of boredom. He shared that he believes that having more groups or opportunities to talk with people would help with his boredom, anxiety, and depression. Case manager encouraged him to reach out to his peers and they confirmed a willingness to talk with him if he approached them.

## 2011-11-09 NOTE — Progress Notes (Signed)
Patient ID: [name], male   DOB: 1971-04-30, 41 y.o.   MRN: [id number]   [facility] Group Notes:  (Counselor/Nursing/MHT/Case Management/Adjunct)  11/09/2011 11 AM  Type of Therapy:  Group Therapy, Dance/Movement Therapy   Participation Level:  Active  Participation Quality:  Appropriate  Affect:  Anxious and Depressed  Cognitive:  Oriented  Insight:  Limited  Engagement in Group:  Good  Engagement in Therapy:  Good  Modes of Intervention:  Clarification, Problem-solving, Role-play, Socialization and Support  Summary of Progress/Problems:  Pt named that his brother, who he regarded as his best friend, is a source of support in his life. Group discussed healthy and unhealthy forms of supporting or not supporting themselves when their external supports are not available. The group made a commitment to spend time with at least one peer later today as a way to practice giving and receiving emotional support. Pt was able to recieve positive support from peers. When peers showed pt their willingness to spend time with him if he approached them, he sat up more vertically and held his head more upright instead of downward cast. Pt seems to benefit from positive emotional intrapersonal interactions, which will help him build a interpersonal sense of support for recovery.    Thomasena Edis, [employer]

## 2011-11-09 NOTE — Progress Notes (Signed)
Writer observed patient in the dayroom watching tv but no interaction with peers. Writer spoke with patient 1:1 and patient reports that it has been a long day today especially with it raining. Patient was informed of his new orders and was pleased, pt. requested a prn dose of seroquel for anxiety which was given. Patient currently denies having pain, -hi/a/v hall but reports off and on si and contracts verbally. Patient supported and encouraged will medicate with hs meds at appropriate time. Safety maintained on unit will continue to monitor.

## 2011-11-10 MED ORDER — CHLORPROMAZINE HCL 50 MG PO TABS
150.0000 mg | ORAL_TABLET | Freq: Every day | ORAL | Status: DC
  Administered 2011-11-10 – 2011-11-13 (×4): 150 mg via ORAL
  Filled 2011-11-10 (×5): qty 3

## 2011-11-10 MED ORDER — CHLORPROMAZINE HCL 100 MG PO TABS
100.0000 mg | ORAL_TABLET | Freq: Three times a day (TID) | ORAL | Status: DC
  Administered 2011-11-10 – 2011-11-15 (×15): 100 mg via ORAL
  Administered 2011-11-15: 50 mg via ORAL
  Administered 2011-11-16 – 2011-11-21 (×17): 100 mg via ORAL
  Filled 2011-11-10 (×11): qty 1
  Filled 2011-11-10: qty 70
  Filled 2011-11-10 (×4): qty 1
  Filled 2011-11-10: qty 70
  Filled 2011-11-10 (×4): qty 1
  Filled 2011-11-10: qty 70
  Filled 2011-11-10: qty 1
  Filled 2011-11-10: qty 70
  Filled 2011-11-10 (×6): qty 1
  Filled 2011-11-10: qty 70
  Filled 2011-11-10 (×4): qty 1
  Filled 2011-11-10: qty 70
  Filled 2011-11-10 (×8): qty 1

## 2011-11-10 NOTE — Progress Notes (Signed)
Recreation Therapy Notes  11/10/2011         Time: 0930      Group Topic/Focus: The focus of this group is on enhancing the patient's understanding of leisure, barriers to leisure, and the importance of engaging in positive leisure activities upon discharge for improved total health.  Participation Level: Active  Participation Quality: Appropriate, Attentive, Sharing and Supportive  Affect: Appropriate  Cognitive: Oriented   Additional Comments: Patient reports improved mood, says he had been laughing a lot lately and that is unusual for him. Patient able to identify positive leisure activities and says he enjoys drumming/music the most because it helps relieve his stress as well. Patient hoping for discharge after meeting with the ACT team tomorrow.   [name] 11/10/2011 10:01 AM

## 2011-11-10 NOTE — Progress Notes (Signed)
Pt has been up and has been active and participating in various activities in the milieu, pt has endorsed having feelings of depression but able to contract for safety on the unit, pt also has endorsed auditory hallucinations, pt has received all medications today without incident, support provided, will continue to monitor

## 2011-11-10 NOTE — Progress Notes (Signed)
Patient ID: [name], male   DOB: [birth year], 41 y.o.   MRN: [id number] [name] presents fully alert and pleasant on approach, calm, cooperative, polite. He is fully alert with good eye contact. He denies any problem with constipation or dysuria. Says he continues to awaken at night although he's got some extra Seroquel and thinks that that may help him. He continues to have suicidal thoughts which she describes as "off and on. Says that he is "feeling a little better today." He rates his depression is 6/10 on a 1-10 scale if 10 is the worst symptoms. Rate his anxiety 9/10 on a 1-10 scale.  He has a meeting scheduled with the act team today and is looking forward to hearing more about their services. He feels positive about working with ACT team. He says that he would either stay with his brother or his mother would pick up the bill for him to stay in a hotel.  He would like to be able to sleep through the night. He feels that he can be safe on the unit. He denies suicidal intent today. He continues to endorse the "shadow people" that he sees at various times at night and during the day. He reports that they speak to them in a soft voice but the voices are there most of the time.  Plan: Discontinue the when necessary Seroquel. We'll continue Seroquel 200 mg every morning and 600 mg each bedtime. Will increase his at bedtime Thorazine to 150 mg.

## 2011-11-10 NOTE — Progress Notes (Signed)
[facility] Group Notes:  (Counselor/Nursing/MHT/Case Management/Adjunct)  11/10/2011 2:34 PM  Type of Therapy:  Group Therapy  Participation Level:  Active  Participation Quality:  Attentive and Sharing  Affect:  Depressed  Cognitive:  Oriented  Insight:  Limited  Engagement in Group:  Good  Engagement in Therapy:  Good  Modes of Intervention:  Education, Problem-solving and Support  Summary of Progress/Problems: Patient expressed some anxiety about talking to [facility]T on Tuesday. He wants it to work because he really doesn't want to go to [facility]. He stated he is tired of being in hospitals. Talked about the positives of ACTT. And discussed questions he might want to ask. Also talked about things he wants from ACTT including helping him with his medications, helping him get to appointments, and other necessary things to stay on track without being up in his business.   HartisAram Bautista 11/10/2011, 2:34 PM

## 2011-11-10 NOTE — Discharge Planning (Signed)
Met with patient in Aftercare Planning Group.   He asks again if "the ball is rolling" with checking in with [employer], Ms. Moore at [facility] office, to find out where his disability application rests at the current time.  We are still awaiting a call back on the signed consent and request for information sent last week.  Patient reported that he is simply waiting now for the assessment by Psychotherapeutic Services staff tomorrow, and CM was able to inform him that the assessment he is having is for the ACT Team, not CST as previously thought.  Case Manager gave a detailed explanation of how that team does their medications in a weekly pillpack.  Patient feels that this will be very beneficial for him, and states that he feels he can handle his medications a week at a time.  CM told him how the [facility] ACT Team picks up an old pillpack when dispensing the new one, in order to ascertain medication compliance as well as prevent a stockpile for self-harm purposes.  He felt the latter reason would be particularly beneficial for him to have the pillpack removed once the new one is delivered.  Patient has been in touch with his mother daily, and believes that she either will put him up in a hotel at discharge, or he will go stay with his little brother.  However, he does not believe he will end up at the shelter in "urban camping" as he called it.  Humor returning, and affect much broader.  Ambrose Mantle, LCSW 11/10/2011, 9:52 AM

## 2011-11-11 MED ORDER — SERTRALINE HCL 100 MG PO TABS
100.0000 mg | ORAL_TABLET | Freq: Every day | ORAL | Status: DC
  Administered 2011-11-12 – 2011-11-19 (×8): 100 mg via ORAL
  Filled 2011-11-11 (×9): qty 1

## 2011-11-11 MED ORDER — BIOTENE DRY MOUTH MT LIQD
15.0000 mL | Freq: Three times a day (TID) | OROMUCOSAL | Status: DC
  Administered 2011-11-11 – 2011-11-21 (×38): 15 mL via OROMUCOSAL
  Filled 2011-11-11 (×3): qty 15

## 2011-11-11 NOTE — Progress Notes (Signed)
[facility] MD Progress Note  11/11/2011 2:08 PM  Diagnosis:  Axis I: Schizoaffective Disorder - Depressed Type.  Posttraumatic Stress Disorder.  Alcohol Dependence.   The patient was seen today and reports the following:   ADL's: Intact.  Sleep: The patient reports to having ongoing difficulty initiating and maintaining sleep.  He continues to report to seeing the "shadow people" at night. Appetite: The patient reports a good appetite today.   Mild>(1-10) >Severe  Hopelessness (1-10): 4  Depression (1-10): 6  Anxiety (1-10): 9   Suicidal Ideation: The patient reports "on and off" suicidal ideations continuing but with no plan or intent.  Plan: No  Intent: No  Means: No  Homicidal Ideation: The patient adamantly denies any homicidal ideations today.  Plan: No  Intent: No.  Means: No   General Appearance/Behavior: Remains friendly and cooperative today.  Eye Contact: Good.  Speech: Appropriate in rate and volume with no pressuring of speech noted today.  Motor Behavior: wnl.  Level of Consciousness: Alert and Oriented x 3.  Mental Status: Alert and Oriented x 3.  Mood: Moderately depressed today.  Affect: Moderately constricted today.  Anxiety Level: Severe anxiety continues.  Thought Process: residual psychosis continues.  Thought Content: The patient reports to hearing voices "on and off" threatening to harm him. He also reports to continuing to see "shadow people" at night who he also states is afraid will harm him. He denies any delusional thinking.  Perception: wnl.  Judgment: Fair.  Insight: Fair.  Cognition: Oriented to person, place and time.  Sleep:  Number of Hours: 6.5    Vital Signs:Blood pressure 105/71, pulse 99, temperature 96.5 F (35.8 C), temperature source Oral, resp. rate 18, height 5' 11.5" (1.816 m), weight 99.791 kg (220 lb).  Current Medications: Current Facility-Administered Medications  Medication Dose Route Frequency Provider Last Rate Last Dose    . acetaminophen (TYLENOL) tablet 650 mg  650 mg Oral Q6H PRN Jorje Guild, PA-C   650 mg at 11/10/11 0747  . alum & mag hydroxide-simeth (MAALOX/MYLANTA) 200-200-20 MG/5ML suspension 30 mL  30 mL Oral Q4H PRN Jorje Guild, PA-C   30 mL at 11/08/11 1040  . antiseptic oral rinse (BIOTENE) solution 15 mL  15 mL Mouth Rinse TID PC & HS Randy D Readling, MD      . benztropine (COGENTIN) tablet 0.5 mg  0.5 mg Oral TID WC & HS Curlene Labrum Readling, MD   0.5 mg at 11/11/11 1207  . carbamazepine (TEGRETOL XR) 12 hr tablet 200 mg  200 mg Oral BH-qamhs Curlene Labrum Readling, MD   200 mg at 11/11/11 0802  . chlorproMAZINE (THORAZINE) tablet 100 mg  100 mg Oral TID Ronny Bacon, MD   100 mg at 11/11/11 1207  . chlorproMAZINE (THORAZINE) tablet 150 mg  150 mg Oral QHS Curlene Labrum Readling, MD   150 mg at 11/10/11 2143  . clonazePAM (KLONOPIN) tablet 0.5 mg  0.5 mg Oral TID WC Curlene Labrum Readling, MD   0.5 mg at 11/11/11 1206  . clonazePAM (KLONOPIN) tablet 1 mg  1 mg Oral QHS Curlene Labrum Readling, MD   1 mg at 11/10/11 2142  . magnesium hydroxide (MILK OF MAGNESIA) suspension 30 mL  30 mL Oral Daily PRN Jorje Guild, PA-C   30 mL at 11/10/11 2143  . mulitivitamin with minerals tablet 1 tablet  1 tablet Oral Daily Ronny Bacon, MD   1 tablet at 11/11/11 0802  . nicotine (NICODERM CQ - dosed in  mg/24 hours) patch 21 mg  21 mg Transdermal Daily Curlene Labrum Readling, MD   21 mg at 11/11/11 1610  . potassium chloride (K-DUR,KLOR-CON) CR tablet 10 mEq  10 mEq Oral BID Curlene Labrum Readling, MD   10 mEq at 11/11/11 0803  . prazosin (MINIPRESS) capsule 1 mg  1 mg Oral QHS Curlene Labrum Readling, MD   1 mg at 11/10/11 2142  . QUEtiapine (SEROQUEL) tablet 200 mg  200 mg Oral QAC breakfast Alyson Kuroski-Mazzei, DO   200 mg at 11/11/11 9604  . QUEtiapine (SEROQUEL) tablet 600 mg  600 mg Oral QHS Verne Spurr, PA-C   600 mg at 11/10/11 2143  . sertraline (ZOLOFT) tablet 100 mg  100 mg Oral Daily Randy D Readling, MD      . sodium chloride (OCEAN) 0.65 %  nasal spray 2 spray  2 spray Each Nare TID WC & HS Ronny Bacon, MD   2 spray at 11/11/11 0802  . thiamine (VITAMIN B-1) tablet 100 mg  100 mg Oral Daily Curlene Labrum Readling, MD   100 mg at 11/11/11 0802  . DISCONTD: antiseptic oral rinse (BIOTENE) solution 15 mL  15 mL Mouth Rinse BH-qamhs Curlene Labrum Readling, MD   15 mL at 11/11/11 0802  . DISCONTD: sertraline (ZOLOFT) tablet 50 mg  50 mg Oral Daily Curlene Labrum Readling, MD   50 mg at 11/11/11 5409   Lab Results: No results found for this or any previous visit (from the past 48 hour(s)).  Time was spent today discussing with the patient his current symptoms. The patient reports that he is sleeping a little better but continues to see "shadow people" at night who he is afraid will harm him.  He reports moderate depression and reports a lessening in the severity of his auditory hallucinations.  He reports ongoing episodic suicidal ideations with no plan or intent.  He states today that his Mother may be relocating to [facility] and he may be able to live with her when this occurs.  Treatment Plan Summary:  1. Daily contact with patient to assess and evaluate symptoms and progress in treatment  2. Medication management  3. The patient will deny suicidal ideations or homicidal ideations for 48 hours prior to discharge and have a depression and anxiety rating of 3 or less. The patient will also deny any auditory or visual hallucinations or delusional thinking.  4. The patient will deny any symptoms of substance withdrawal at time of discharge.   Plan:  1. Will continue the patient on his current medications.  2. Will increase the medication Zoloft to 100 mgs po q am for depression and anxiety.  3. Will continue to monitor.  4. Laboratory studies reviewed.  5. At the patient's request, a Nutrition Consult will be ordered. 6. The patient has been referred to [facility] for longer term treatment and is on the waiting list.  RANDY READLING 11/11/2011, 2:08 PM

## 2011-11-11 NOTE — Tx Team (Signed)
Interdisciplinary Treatment Plan Update (Adult)  Date:  11/11/2011  Time Reviewed:  10:15AM-11:00AM  Progress in Treatment: Attending groups:  Yes Participating in groups:    Yes Taking medication as prescribed:    Yes Tolerating medication:   Yes Family/Significant other contact made:  No Patient understands diagnosis:   Yes Discussing patient identified problems/goals with staff:   Yes Medical problems stabilized or resolved:   Yes Denies suicidal/homicidal ideation:  No Issues/concerns per patient self-inventory:   None Other:    New problem(s) identified: No, Describe:    Reason for Continuation of Hospitalization: Depression Medication stabilization Suicidal ideation Other; describe   Not sleeping  Interventions implemented related to continuation of hospitalization:  Medication monitoring and adjustment, safety checks Q15 min., suicide risk assessment, group therapy, psychoeducation, collateral contact, aftercare planning, ongoing physician assessments, medication education  Additional comments:  Not applicable  Estimated length of stay:  3-5 days  Discharge Plan:  Is on [facility] wait list, but if goes before that, would go to brother's house, hopefully will be accepted by the [facility] ACTT to follow up  New goal(s):  Not applicable  Review of initial/current patient goals per problem list:   1.  Goal(s):  Deny SI for 48 hours prior to D/C.  Met:  No  Target date:  By Discharge   As evidenced by:  Still thinks of suicide "every hour"  2.  Goal(s):  Reduce psychosis to baseline.  Met:  No  Target date:  By Discharge   As evidenced by:  Constant voices, but less intense.  Still seeing shadow people.  3.  Goal(s):  Decrease depression from admission level to no more than 3 at discharge.  Met:  No  Target date:  By Discharge   As evidenced by:  "6" today  4.  Goal(s):  Reduce anxiety from "9" on 3/19 to no more than 3 at discharge.  Met:  No  Target date:  By  Discharge   As evidenced by:  "9" today  5.  Goal(s):  Be able to sleep at least 5-6 hours per night.  Met:  No  Target date:  By Discharge   As evidenced by:  3-4 hours last night   Attendees: Patient:  [name]  11/11/2011 10:15AM-11:00AM  Family:     Physician:  Dr. Harvie Heck Readling 11/11/2011 10:15AM-11:00AM  Nursing:   Nanine Means, RN 11/11/2011 10:15AM -11:00AM   Case Manager:  Ambrose Mantle, LCSW 11/11/2011 10:15AM-11:00AM  Counselor:  Veto Kemps, MT-BC 11/11/2011 10:15AM-11:00AM  Other:      Other:      Other:      Other:       Scribe for Treatment Team:   Sarina Ser, 11/11/2011, 10:15AM-11:15AM

## 2011-11-11 NOTE — Progress Notes (Signed)
Patient walked to the window this afternoon for his medications. He reports felling well; although stated that the voices will never completely go away, but it's tuning down and he is feeling better. Patient received his medications as ordered.  Q 15 minutes check continues to maintain safety.

## 2011-11-11 NOTE — Discharge Planning (Signed)
Met with patient in Aftercare Planning Group.   Facilitated his meeting with ACTT.  Awaiting word back about eligibility for this enhanced service.  No other known case management needs today.  Ambrose Mantle, LCSW 11/11/2011, 2:07 PM

## 2011-11-11 NOTE — Progress Notes (Signed)
Patient up and visible in the milieu today.  Attending groups.  Interacting well with staff and peers.  Continues to endorse vague suicidal thoughts with no intent or plan.  Agrees to seek out staff if feeling unsafe.  Tolerating medications today.  Has concerns about medications causing weight gain, states he has gained about 30 pounds in the past year on his current medication regime.  He has been pleasant and cooperative.  Requested to have Biotene changed to QID after meals and bedtime.  Relayed message to Dr. Allena Katz and the order was changed per request.

## 2011-11-11 NOTE — Progress Notes (Signed)
Pt observed in the dayroom watching TV.  He appropriate with staff and peers.  Pt voices concerns with sleep and states AH are worse at night.  Showed pt that adjustments to his hs meds had been made today.  He is also requesting MOM as he has not had a BM in two days.  Pt is cooperative with conversation and contracts for safety.  Encouraged pt to make needs known to staff.  Safety maintained with q15 minute checks.

## 2011-11-12 NOTE — Progress Notes (Signed)
Patient ID: [name], male   DOB: 01-Mar-1971, 41 y.o.   MRN: [id number]  [name] presents fully alert, fairly bright affect, in full contact with reality, he is not appear to be internally distracted. He reports that he is falling asleep easier and increased Thorazine, but still awakens several times during the night. It takes him a while to get back to sleep.  He states he is not concerned about his sleep today but rather his anxiety level he continues to be a 9/10. I have asked him to explain this more, and he says it feels like a feeling of impending doom. Strong sense of dread, he dreads even waiting in line to get to the cafeteria. Dreads being around people.  He reports that the voices are there constantly although they're quite subdued and he rates him a level of 6/10 on a 1-10 scale, rates depression as 6/10. He rates his hopelessness today a 4/10, he is more hopeful now that he has had an interview with the [facility] act team. He endorses that he has suicidal thoughts but can contract for safety, he is enjoying counseling sessions on another unit and feels that these are very helpful to him.  He is pleased with the services that the [facility] act team has to offer. He continues to endorse suicidal thoughts. It is still not clear where where he will go live when he leaves.  Objective: Fully alert male, I calm, no evidence objectively of anxiety. Although he complained subjectively being very anxious he is not appear to be anxious. Continues to endorse the same visual hallucinations at night shadow people, auditory hallucinations that tell him to harm himself. Says that he is sleeping better. Feels it can be safe here today; no suicidal intent or plan.  Plan: We'll continue current meds.  Have reviewed these findings and the plan with Dr. Allena Katz and he is in agreement.

## 2011-11-12 NOTE — Progress Notes (Signed)
Pt has been up and programming on [address] hall today.  He rated his depression and hopelessness a 6 and his anxiety a 9 on his self-inventory.  He did admit to still feeling suicidal but stated,"I feel safe here" and does contract for safety on the unit.  He admitted to A/V hallucinations but did not want discuss with this nurse what he was hearing or seeing.  He stated, "I would rather talk with the doctor about those kinds of things"  Informed him that I would be available and willing to speak with him if he wanted or needed.  He voiced understanding.  During treatment team, it was discussed that pt is hearing voices and seeing "shadow people" in his room at night.  Pt will have a CST at discharge and plan is still possible [facility], little brother, hotel or now, it has been discussed that his Mother may move here from [facility] to help look after her son.  Pt did mark on his self-inventory that he would have problems staying on medication at discharge due to "no income"  MD and case manager aware.

## 2011-11-12 NOTE — Progress Notes (Signed)
11/12/2011         Time: 0930      Group Topic/Focus: The focus of this group is on discussing various aspects of wellness, balancing those aspects and exploring ways to increase the ability to experience wellness.  Participation Level: Minimal  Participation Quality: Appropriate  Affect: Appropriate  Cognitive: Confused   Additional Comments: Patient missed much of group as he was with MD.   Kalanie Fewell 11/12/2011 1:18 PM

## 2011-11-12 NOTE — Discharge Planning (Signed)
Met with patient in Aftercare Planning Group.   It has been explained to patient and Case Manager that the [facility] is unlikely to approve ACTT services immediately since patient has not yet been tried on [employer]; therefore, [facility] is going to start patient on that service first.  They are likely to bump him up to ACTT quickly, even within a month, but state that otherwise they can probably not get him approved.  Case Manager explained this to patient, as well as what services can be provided by CST.  Because CST does not have a doctor within the team, patient will need to go to [facility] for medication management until he starts ACTT.  This will be arranged by Case Manager prior to discharge.  Case Manager spoke late yesterday with Ms. Moore from [employer], who reported that patient's disability application was turned down on 07/17/11 due to lack of medical records being provided.  Case Manager explained that patient has been in the hospital practically nonstop the last 12 months, but the denial is out of the appeals period, so must be started over.  Case Manager printed process for patient, and asked patient to make an appointment today in order for this date to be used as his protected filing date.  He agreed to do this.  Patient complained of problems with his short-term memory.  Another patient became agitated with him during group, as this patient was putting on his watch and the other patient was paranoid about the action, and told this patient it was rude not to pay attention.  Patient was able to focus on self, and move on without incident.  Ambrose Mantle, LCSW 11/12/2011, 1:37 PM

## 2011-11-12 NOTE — Progress Notes (Signed)
Patient ID: [name], male   DOB: 1971/05/14, 41 y.o.   MRN: [id number] Pt is asleep for some of the afternoon but is now awake and active on the unit. Pt is attending groups and is cooperative with staff. Pt denies HI and AVH but endorses passive SI without a plan. Pt is able to contract for safety and states that he would not harm himself. Writer acknowledged that passive SI may be baseline for him and pt agreed stating that the thoughts come and go whether he is in [facility] or not. Pt also stated that he will have an ACT team he can rely upon when he leaves. Pt may d/c to state hospital or perhaps stay with a family member. Writer will continue to monitor.

## 2011-11-12 NOTE — Progress Notes (Signed)
[facility] Group Notes:  (Counselor/Nursing/MHT/Case Management/Adjunct)  11/12/2011 9:06 AM  Type of Therapy:  Group Therapy 11/11/11  Participation Level:  Active  Participation Quality:  Attentive and Sharing  Affect:  Depressed  Cognitive:  Oriented  Insight:  Limited  Engagement in Group:  Good  Engagement in Therapy:  Good  Modes of Intervention:  Education, Problem-solving and Support  Summary of Progress/Problems: Patient voiced concerns that his unit was not getting as many groups due to treatment team in the morning. Counselor suggested that he attend groups on [address]. He continues to anticipate meeting with his ACTT. Hopes they will be helpful. Talked about hiding his depression for years but finally letting his family know. Knows brother will be supportive. Encouraged him to use his support systems.    Margherita Collyer, [name] 11/12/2011, 9:06 AM

## 2011-11-13 NOTE — Discharge Planning (Signed)
Met with patient in Aftercare Planning Group.   He reports continuing suicidal ideation, does not feel ready to leave hospital; however, states he does not feel he needs to be on the state hospital wait list.  Case Manager did confirm his spot on that wait list [facility]) and told him that we would keep this referral in place in case needed.  Unable to get appointment at [facility], as they have none available.  They do report that patient is current with them, and can come to their walk-in clinic.  Set appointment for patient to have intake with [facility] Support Team.  Patient also states that he has a current therapist, Rona Ravens at [facility] of the [place].  Case Manager will follow up with this individual re appointment.  Ambrose Mantle, LCSW 11/13/2011, 1:30 PM

## 2011-11-13 NOTE — Progress Notes (Signed)
Nutrition Brief Note  - Called Behavioral Health Adult Unit to set up time to meet with pt, RN stated today from 3-5pm would be when pt available to meet. Will attempt visit later on today during these hours.   Dietitian # [phone]

## 2011-11-13 NOTE — Progress Notes (Signed)
[facility] Group Notes:  (Counselor/Nursing/MHT/Case Management/Adjunct)  11/13/2011 2:11 PM  Type of Therapy:  Group Therapy  Participation Level:  Active  Participation Quality:  Attentive and Sharing  Affect:  Depressed  Cognitive:  Oriented  Insight:  Limited  Engagement in Group:  Good  Engagement in Therapy:  Good  Modes of Intervention:  Clarification, Education, Problem-solving and Support  Summary of Progress/Problems: Patient continues to have suicidal thoughts but he stated these have decreased. Also reported high anxiety and seeing shadows. Did not want to call them hallucinations. Spoke of really relating to peer specialist from yesterday. He can't identify the source of his anxiety. Encouraged him to look at something that he could look forward in the future. Had difficulty coming up with something except spending time with brother watching movies. Spoke with him of a plan when he does have suicidal thoughts since this is something he has been dealing with for some time. He is excited about using the community resource team and feels this will be healthy coping.   [name], [name] 11/13/2011, 2:11 PM

## 2011-11-13 NOTE — Progress Notes (Signed)
[facility] Group Notes:  (Counselor/Nursing/MHT/Case Management/Adjunct)  11/13/2011 2:08 PM  Type of Therapy:  Psychoeducational Skills 11/12/11  Participation Level:  Active  Participation Quality:  Attentive and Drowsy  Affect:  Blunted  Cognitive:  Oriented  Insight:  Limited  Engagement in Group:  Good  Engagement in Therapy:  Good  Modes of Intervention:  Education and Support  Summary of Progress/Problems: Patient related a lot to peer specialist from mental health association, particularly as he discussed belief that were not reality. He was interested in resources and had appropriate questions related to these resources.   Jhania Etherington, [name] 11/13/2011, 2:08 PM

## 2011-11-13 NOTE — Plan of Care (Signed)
Problem: Food- and Nutrition-Related Knowledge Deficit (NB-1.1) Goal: Nutrition education Formal process to instruct or train a patient/client in a skill or to impart knowledge to help patients/clients voluntarily manage or modify food choices and eating behavior to maintain or improve health.  Outcome: Completed/Met Date Met:  11/13/11 - Estimated nutritional needs for weight loss: 1600-1950 calories, 80-95g protein. Met with pt who reports unintentional weight gain of 30 pounds in the past 6 months. Pt reports typically eating 2 meals/day PTA with most food choices consisting of pot pies and sandwiches, and pt drinking diet coke. Pt reports he wants to improve his nutrition and already knows some basic information from taking a nutrition course in school. Discussed importance of small frequent meals to increase metabolism for healthy weight loss. Pt reports wanting to exercise more such as walking and has been thinking about buying a bike. Pt reports that when he is upset sometimes he overeats, but he is working on positive coping skills rather than food like writing or reading a book. Discussed healthy meal and snack options. Pt states he wants to start snacking on fruit and kashi bars. Pt states he knows his portion sizes are too large and knows he needs to monitor them. Pt identified the following nutrition goals to work on: 1. Reducing excess portion sizes of food, 2. Eating a more balanced diet, 3. Eating small frequent meals rather than just 1-2 meals/day, 4. Starting to exercise. Pt appreciative of discussion and expressed understanding of information.

## 2011-11-13 NOTE — Progress Notes (Signed)
Patient ID: [name], male   DOB: 22-Nov-1970, 41 y.o.   MRN: [id number] [name] presents fully alert with a fairly bright affect. Hygiene is good. Dress is appropriate. He reports that he slept on off last night although the staff reported that he slept 6.25 hours. He says he is feeling better. He continues to rates anxiety as a 9/10 on a 1-10 scale. He describes these are of feelings of dread for various activities such as standing in line going to [employer], social activities  He says his auditory hallucinations are pretty constant but improved over what they used to be. He rates the intensity of his auditory hallucinations as 6/10. He feels he can be safe here on the unit and is having suicidal thoughts on and off, but is more concerned about it he can be safe in the community. He has another interview with the [facility] ACT team on Monday and he is looking forward to this. He rates his hopelessness 4/10.  He would like for me to increase his Klonopin today, feeling that that is feeling thing that helps him with his anxiety, but I have declined to do this. Given his history of alcohol and cannabis abuse.  Plan: Continue current plan. This was reviewed with the team. He continues to be on the [facility] waiting list.

## 2011-11-13 NOTE — Progress Notes (Signed)
Pt states that he is SI but does contract for safety. Pt feels that his voices are decreasing but he still sees shadows. Pt attends groups and interacts well with others. [facility]. Pt was offered support and encouragement. Pt is receptive to treatment and safety maintained on unit.

## 2011-11-14 LAB — COMPREHENSIVE METABOLIC PANEL
ALT: 58 U/L — ABNORMAL HIGH (ref 0–53)
AST: 38 U/L — ABNORMAL HIGH (ref 0–37)
Albumin: 4.4 g/dL (ref 3.5–5.2)
Alkaline Phosphatase: 97 U/L (ref 39–117)
BUN: 20 mg/dL (ref 6–23)
CO2: 30 mEq/L (ref 19–32)
Calcium: 9.9 mg/dL (ref 8.4–10.5)
Chloride: 97 mEq/L (ref 96–112)
Creatinine, Ser: 1.29 mg/dL (ref 0.50–1.35)
GFR calc Af Amer: 79 mL/min — ABNORMAL LOW (ref >90)
GFR calc non Af Amer: 68 mL/min — ABNORMAL LOW (ref >90)
Glucose, Bld: 93 mg/dL (ref 70–99)
Potassium: 4.1 mEq/L (ref 3.5–5.1)
Sodium: 137 mEq/L (ref 135–145)
Total Bilirubin: 0.2 mg/dL — ABNORMAL LOW (ref 0.3–1.2)
Total Protein: 7.4 g/dL (ref 6.0–8.3)

## 2011-11-14 LAB — CARBAMAZEPINE LEVEL, TOTAL: Carbamazepine Lvl: 5.7 ug/mL (ref 4.0–12.0)

## 2011-11-14 MED ORDER — CHLORPROMAZINE HCL 100 MG PO TABS
200.0000 mg | ORAL_TABLET | Freq: Every day | ORAL | Status: DC
  Administered 2011-11-14 – 2011-11-20 (×7): 200 mg via ORAL
  Filled 2011-11-14 (×9): qty 2

## 2011-11-14 MED ORDER — CLONAZEPAM 0.5 MG PO TABS
0.5000 mg | ORAL_TABLET | Freq: Two times a day (BID) | ORAL | Status: DC
  Administered 2011-11-14 – 2011-11-17 (×6): 0.5 mg via ORAL
  Filled 2011-11-14 (×6): qty 1

## 2011-11-14 MED ORDER — CLONAZEPAM 1 MG PO TABS
1.0000 mg | ORAL_TABLET | ORAL | Status: DC
  Administered 2011-11-14 – 2011-11-17 (×6): 1 mg via ORAL
  Filled 2011-11-14 (×6): qty 1

## 2011-11-14 NOTE — Progress Notes (Signed)
Patient ID: [name], male   DOB: [birth year], 41 y.o.   MRN: [id number] [name] presents fully alert, pleasant, well groomed, he has been attending groups on her mood disorders unit and he finds that this is beneficial. He rates his depression a 5-6/10 on a 1-10 scale if 10 is the worst symptoms.   He feels his depression is getting better he thinks that the Zoloft is helping him. He has taken Prozac in the past but feels the Zoloft is more effective. He says the voices are a little bit better, "I'm handling them better." He still reports that he has continuous auditory hallucinations that tell him that he should be dead, he also continues to see what he calls "shadow people" at night.  He continues to endorse suicidal thoughts there on and off throughout the day, and feels he can be safe on the unit but not in the community. He rates his hopelessness a 5/10 on a 1-10 scale. Anxiety is 8-9/10. He feels anxiety as his biggest problem at this point. He says it causes him to be distracted and keeps him from concentrating in group therapy, and keeps her from resting.  Plan: Reviewed with Dr. Allena Katz and he agrees with the following plan.   Will increase his Klonopin to 1 mg every morning and at bedtime, and 0.5 mg at noon and 5 PM. Will increase his Thorazine to 100 mg 3 times a day and 200 mg by mouth each bedtime.

## 2011-11-14 NOTE — Progress Notes (Signed)
[facility] Group Notes:  (Counselor/Nursing/MHT/Case Management/Adjunct)  11/14/2011 12:03 PM  Type of Therapy:  Group Therapy  Participation Level:  Active  Participation Quality:  Appropriate  Affect:  Appropriate  Cognitive:  Appropriate  Insight:  Good  Engagement in Group:  Good  Engagement in Therapy:  Good  Modes of Intervention:  Support  Summary of Progress/Problems:Patient participated actively in group, sharing his own difficulty recovering from grief and PTSD resulting from his fiancees suicide three years ago. Was able to trace relapse episodes back to failure to take medication, and engaged in problem solving with the group to develop strategies to ensure he takes his medication regularly upon discharge.   Mendleson, Ruthanne Mcneish 11/14/2011, 12:03 PM

## 2011-11-14 NOTE — Discharge Planning (Signed)
Met with patient in Aftercare Planning Group.   No case management needs today.  As patient usually does, he asked a number of questions.  Did utilization review requesting additional days.  Ambrose Mantle, LCSW 11/14/2011, 1:14 PM

## 2011-11-14 NOTE — Progress Notes (Signed)
[facility] Group Notes:  (Counselor/Nursing/MHT/Case Management/Adjunct)  11/14/2011 10:08 AM  Type of Therapy:  Group Therapy  Participation Level:  Did Not Attend  Patient attending 500 unit programming   Panzy Bubeck, [name] 11/14/2011, 10:08 AM

## 2011-11-14 NOTE — Progress Notes (Signed)
Pt states that he is feeling a little better overall. The suicidal thoughts are not as intense and they are not as persistant as before. Has periods of not wanting to kill himself. States he is sleeping better, although he says he sleeps during the day when there is quiet time and it messes up his night time sleep. Agreed that he would stay awak today to see if it helps him to have more hours of sleep at night. Continues to hear the voices and to see shadow people but both are also decreasing. Given support, reassurance and praise.

## 2011-11-14 NOTE — Tx Team (Signed)
Interdisciplinary Treatment Plan Update (Adult)  Date:  11/14/2011  Time Reviewed:  10:15AM-11:00AM  Progress in Treatment: Attending groups:  Yes Participating in groups:    Yes Taking medication as prescribed:    Yes Tolerating medication:   Yes Family/Significant other contact made:  No Patient understands diagnosis:   Yes Discussing patient identified problems/goals with staff:   Yes Medical problems stabilized or resolved:   Yes Denies suicidal/homicidal ideation:  No, constant Issues/concerns per patient self-inventory:   None Other:    New problem(s) identified: No, Describe:    Reason for Continuation of Hospitalization: Anxiety Depression Hallucinations Medication stabilization Suicidal ideation  Interventions implemented related to continuation of hospitalization:  Medication monitoring and adjustment, safety checks Q15 min., suicide risk assessment, group therapy, psychoeducation, collateral contact, aftercare planning, ongoing physician assessments, medication education  Additional comments:  Not applicable  Estimated length of stay:  3-4 days  Discharge Plan:  Is on [facility] wait list, but if goes before that, would go to brother's house, has been accepted by [facility] for follow-up, with transfer to [facility]T as needed.  Will also get medications at [facility] and go to [facility] of the [place] for counseling.  New goal(s):  Not applicable  Review of initial/current patient goals per problem list:   1.  Goal(s):  Deny SI for 48 hours prior to D/C.  Met:  No  Target date:  By Discharge   As evidenced by:  Still suicidal "constantly"  2.  Goal(s):  Reduce psychosis to baseline.  Met:  No  Target date:  By Discharge   As evidenced by:  Voices tell him to hurt himself  3.  Goal(s):  Decrease depression from admission level to no more than 3 at discharge.  Met:  No  Target date:  By Discharge   As evidenced by:  Depression varies from day to day, but always  remains high  4.  Goal(s):  Reduce anxiety from "9" on 3/19 to no more than 3 at discharge.  Met:  No  Target date:  By Discharge   As evidenced by:  States anxiety at 8-9.  5.  Goal(s):  Be able to sleep at least 5-6 hours per night.  Met:  Yes  Target date:  By Discharge   As evidenced by:  6.0 to 6.5 hours sleep last 3 nights  Attendees: Patient:  Did not attend   Family:     Physician:  Dr. Harvie Heck Readling 11/14/2011 10:15AM-11:00AM  Nursing:   Lamount Cranker, RN 11/14/2011 10:15AM -11:00AM   Case Manager:  Ambrose Mantle, LCSW 11/14/2011 10:15AM-11:00AM  Counselor:  Veto Kemps, MT-BC 11/14/2011 10:15AM-11:00AM  Other:   Lynann Bologna, NP 11/14/2011 10:15AM-11:00AM  Other:      Other:      Other:       Scribe for Treatment Team:   Sarina Ser, 11/14/2011, 10:15AM-11:15AM

## 2011-11-14 NOTE — Progress Notes (Signed)
Currently resting quietly in bed in left lateral position with eyes closed. Respirations are even and unlabored. No acute distress noted. Safety has been maintained with Q15 minute observation. Will continue current POC. 

## 2011-11-15 NOTE — Progress Notes (Signed)
Patient ID: [name], male   DOB: [birth year], 41 y.o.   MRN: [id number] Pt is awakae and active on the unit this PM. Pt is interacting in the day room and is cooperative with staff. Pt denies HI but endorses passive SI. He is able to contract for safety. Pt states that he hears voices as well but that he is learning to cope with them better as time goes by. Pt mood is pleasant and his affect is anxious. Pt plans to live with family after d/c. Writer will continue to monitor.

## 2011-11-15 NOTE — Progress Notes (Signed)
Pt Is appropriate and pleasant  He is mildly anxious and depressed   He is cooperative with treatment and takes and knows his medications   He is very astute when it comes to his medication regimen   Verbal support given  Medications administered and effectiveness monitored  Q 15 min checks  Pt safe at present

## 2011-11-15 NOTE — Progress Notes (Signed)
Patient ID: [name], male   DOB: 27-Sep-1970, 41 y.o.   MRN: [id number]  [facility] Group Notes:  (Counselor/Nursing/MHT/Case Management/Adjunct)  11/15/2011 11 AM  Type of Therapy:  Group Therapy, Dance/Movement Therapy   Participation Level:  Active  Participation Quality:  Appropriate  Affect:  Appropriate  Cognitive:  Alert  Insight:  Good  Engagement in Group:  Good  Engagement in Therapy:  Good  Modes of Intervention:  Clarification, Problem-solving, Role-play, Socialization and Support  Summary of Progress/Problems: Therapist discussed with group healthy actions to encorparate into their lives when the pt. is feeling frustrated with everyday life situations. Therapist asked group "What healthy coping mechanism can you do when you are feeling frustrated".  Pt. stated that he will not sweat the small stuff and will redirect in a positive way if he verbally take his frustrations out on someone else".   [name]

## 2011-11-15 NOTE — Progress Notes (Signed)
Patient ID: [name], male   DOB: November 25, 1970, 41 y.o.   MRN: [id number] Pt. attended and participated in aftercare planning group. Pt. accepted information on suicide prevention, warning signs to look for with suicide and crisis line numbers to use. The pt. agreed to call crisis line numbers if having warning signs or having thoughts of suicide. Pt. listed their current anxiety level as medium.  Pt. stated that he is feeling the color blue because his daughter's birthday is this weekend".

## 2011-11-15 NOTE — Progress Notes (Signed)
Patient ID: [name], male   DOB: 11-May-1971, 41 y.o.   MRN: [id number]   Foster presents fully alert, pleasant, well groomed, he has been attending groups on her mood disorders unit and he finds that this is beneficial.   Reports that he was not taking his meds before admission. No feels better with meds and meds are helping his mood. Plan to continue taking meds this time. Wants to follow with ACT team after dc this time. Sleep and appetitie is good  MSE:   alert, fair eye contact, He still reports that he has continuous auditory hallucinations.  He continues to endorse suicidal thoughts there on and off , logical, fair awareness into his problems currently.  DX; Sczizoaffective D/O   Plan:   Continue current treatment plan

## 2011-11-15 NOTE — Progress Notes (Signed)
Writer observed patient sitting in the dayroom eating snack. Writer spoke with patient 1:1 informing him of his hs medications. Patient reports that his day has been ok and reports si off and on and verbally contracts. Patient currently denies having pain, +auditory hall., - visual hallucinations. Patient reports he is going to  speaking to his doctor concerning his minipress, patient reports that he no longer has nightmare and wants to have it discontinued. Patient was supported and encouraged, safety maintained on unit, will continue to monitor.

## 2011-11-16 NOTE — Progress Notes (Signed)
Pt is pleasant and appropriate in his interactions but continues to endorse voices and having suicidal thoughts   His thinking is logical and coherent   He is compliant with treatment  He does not appear to be responding to internal stimuli  Verbal support given  Medications administered and effectiveness monitored  Q 15 min checks   Pt safe at present

## 2011-11-16 NOTE — Progress Notes (Signed)
[facility] Group Notes:  (Counselor/Nursing/MHT/Case Management/Adjunct)  11/16/2011 11 AM  Type of Therapy:  Group Therapy, Dance/Movement Therapy   Participation Level:  Active  Participation Quality:  Appropriate, Attentive, Sharing and Supportive  Affect:  Appropriate  Cognitive:  Appropriate  Insight:  Good  Engagement in Group:  Good  Engagement in Therapy:  Limited  Modes of Intervention:  Clarification, Problem-solving, Role-play, Socialization and Support  Summary of Progress/Problems: pt spoke about today being Easter and how this does not have much of a personal meaning to him but how today is his daughter's birthday and how he wishes he could see her and spend time with her. Pt vocalized a plan to call her latter today. Pt spoke about wanting to be connected with an ACT team and no longer feeling that he [facility], stating that he feels like going home now.    Gevena Mart

## 2011-11-16 NOTE — Progress Notes (Signed)
Writer observed patient sitting in the dayroom watching tv with some interaction with his roommate who was sitting next to him. Patient reports having had a good day, still has si off and on, + auditory hall. Patient informed that there were no new medication changes and patient was agreeable to taking his medications. Currently denies having pain, safety maintained on unit, will continue to monitor.

## 2011-11-16 NOTE — Progress Notes (Signed)
Patient ID: [name], male   DOB: 1971-06-04, 41 y.o.   MRN: [id number] Patient ID: [name], male   DOB: Apr 24, 1971, 41 y.o.   MRN: [id number]   Taite presents fully alert, pleasant, well groomed, he has been attending groups on her mood disorders unit and he finds that this is beneficial to him.  More calmer and more talkative today. Was seen in day room. Reports that he was not taking his meds before admission. Now feels better with meds and meds are helping his mood and AVH. Plan to continue taking meds this time. Wants to follow with ACT team after dc this time. Sleep and appetitie is good. Plan to stay with his parents and also plan to help her sister with real estate business after discharge.  MSE:  alert, fair eye contact, He still reports that he has auditory hallucinations but it less frequent and less intense.  He continues to endorse suicidal thoughts on and off , logical, fair awareness into his problems currently.  DX; Sczizoaffective D/O   Plan:   Continue current treatment plan

## 2011-11-17 MED ORDER — CLONAZEPAM 1 MG PO TABS
1.0000 mg | ORAL_TABLET | Freq: Three times a day (TID) | ORAL | Status: DC
  Administered 2011-11-17 – 2011-11-21 (×15): 1 mg via ORAL
  Filled 2011-11-17 (×2): qty 1
  Filled 2011-11-17: qty 28
  Filled 2011-11-17: qty 1
  Filled 2011-11-17 (×2): qty 28
  Filled 2011-11-17: qty 1
  Filled 2011-11-17: qty 28
  Filled 2011-11-17 (×5): qty 1
  Filled 2011-11-17: qty 28
  Filled 2011-11-17 (×2): qty 1
  Filled 2011-11-17: qty 28
  Filled 2011-11-17: qty 1
  Filled 2011-11-17: qty 28
  Filled 2011-11-17 (×3): qty 1
  Filled 2011-11-17: qty 28

## 2011-11-17 NOTE — Progress Notes (Signed)
Pt is pleasant and cooperative. Pt attends groups and interacts well with other. Pt will no longer be going to [facility] but when he discharges he will be going to live with parents. Pt was offered support and encouragement. Pt receptive to treatment and safety maintained on unit.

## 2011-11-17 NOTE — Progress Notes (Signed)
[facility] Group Notes:  (Counselor/Nursing/MHT/Case Management/Adjunct)  11/17/2011 2:13 PM  Type of Therapy:  Group Therapy  Participation Level:  Minimal  Participation Quality:  Attentive  Affect:  Depressed  Cognitive:  Alert  Insight:  Poor  Engagement in Group:  Limited  Engagement in Therapy:  Limited  Modes of Intervention:  Clarification, Education, Limit-setting, Orientation, Problem-solving, Socialization and Support  Summary of Progress/Problems:Summary of Progress/Problems: Pt participated in group by listening attentively and openly disclosing. Therapist prompted Pt to state 2 things he liked about himself.  Therapist asked Pt to identify one thing he would like to change and what he could do to make that change.  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  Pt reported he would like to lose 30 pounds.  He reported he could eat healthy foods and exercise.  Therapist emphasized the importance of engaging in positive activities in order to maintain a balanced lifestyle.  Minimal progress noted.  Intervention effective.      Marni Griffon C 11/17/2011, 2:13 PM

## 2011-11-17 NOTE — Progress Notes (Signed)
In dayroom watching TV with peers on approach. No interacting noted. Appears flat and depressed, but does brighten at intervals during the assessment. Calm and cooperative. No acute distress noted. States he is doing well. Discussed his pattern of admissions. States he doesn't know why but at the 2 month he just seems to get worse. Discussed his daily life outside of hospital and he describes it as ok and appears to get out and do things with his Brother. States he thinks his mother is going to move back to [facility] from [place] and he will likely move in with her. Stated, "she needs help (39yo) and I need help too.". Stated he is hopeful that ACTT services will be beneficial to him. Support and encouragement provided. Continues to endorse passive Si without plan, but contracts for safety. Stated he had AH early in the day but currently denies. Denies HI. Offered no questions or concerns otherwise. Denies pain. POC and medications for the shift reviewed and understanding verbalized. Safety has been maintained with Q41minute observation. Will continue current POC.

## 2011-11-17 NOTE — Progress Notes (Signed)
Recreation Therapy Notes  11/17/2011         Time: 0930      Group Topic/Focus: The focus of this group is on discussing various styles of communication and communicating assertively using 'I' (feeling) statements.  Participation Level: Active  Participation Quality: Appropriate and Attentive  Affect: Appropriate  Cognitive: Oriented   Additional Comments: Patient clearly bored with groups, making obvious jokes during the activity.   [name] 11/17/2011 3:40 PM

## 2011-11-17 NOTE — Discharge Planning (Signed)
Met with patient in Aftercare Planning Group.   He states he is not yet ready for discharge, but feels better and is getting close.  Case Manager confirmed with [facility] that patient needs to remain on their wait list at this time.  Updated utilization review information submitted last week and submitted new request for current coverage.  Patient states he wants to stay in the hospital for his intake with [facility], although Case Manager informed him this was not necessary, that they could come to him wherever he is.  He also stated that he has not yet been able to set a Optometrist appointment, as he has only called during off-hours.  No other case management needs today.  Ambrose Mantle, LCSW 11/17/2011, 9:31 AM

## 2011-11-17 NOTE — Progress Notes (Signed)
Patient ID: [name], male   DOB: April 25, 1971, 41 y.o.   MRN: [id number] On time of assessment, patient reports no suicidal ideations or auditory hallucinations. Patient did state that he has been have SI on/off throughout the day and had recently heard voices about 5 minutes before assessment. Patient reports being able to contract for safety and reach out to staff if needed. Patient encouraged to notify staff. Denies HI. Patient was requesting information about cost of medications, primarily tegretol XR. Discussed with patient that this staff member will leave note for treatment team. Patient took all scheduled medications without incident this evening. No PRNs requested. Patient polite and cooperative this evening. No behavioral issues noted. Pain not voiced or observed. No concerns or complications noted this shift. Monitor q15 minutes to ensure patient safety and continue plan of care. Patient remains safe while on the unit.

## 2011-11-17 NOTE — Progress Notes (Signed)
Report received from N. [employer]ett[employer] Charity fundraiser. Writer observed patient lying in bed asleep with eyes closed resp. even and unlabored no distress noted. Safety maintained on unit, will continue to monitor.

## 2011-11-17 NOTE — Progress Notes (Signed)
[facility] MD Progress Note  11/17/2011 3:15 PM  Diagnosis:  Axis I: Schizoaffective Disorder - Depressed Type.  Posttraumatic Stress Disorder.  Alcohol Dependence.   The patient was seen today and reports the following:   ADL's: Intact.  Sleep: The patient reports to having ongoing difficulty initiating and maintaining sleep but states this is much improved with the increase in the medication Thorazine. He continues to report to seeing the "shadow people" at night.  Appetite: The patient reports a good appetite today.   Mild>(1-10) >Severe  Hopelessness (1-10): 5  Depression (1-10): 6  Anxiety (1-10): 8   Suicidal Ideation: The patient again reports "on and off" suicidal ideations but with no plan or intent.  Plan: No  Intent: No  Means: No  Homicidal Ideation: The patient adamantly denies any homicidal ideations today.  Plan: No  Intent: No.  Means: No   General Appearance/Behavior: Remains friendly and cooperative today.  Eye Contact: Good.  Speech: Appropriate in rate and volume with no pressuring of speech noted today.  Motor Behavior: wnl.  Level of Consciousness: Alert and Oriented x 3.  Mental Status: Alert and Oriented x 3.  Mood: Moderately depressed today.  Affect: Moderately constricted today.  Anxiety Level: Severe anxiety continues.  Thought Process: residual psychosis continues.  Thought Content: The patient reports to hearing voices "on and off" threatening to harm him. He also reports to continuing to see "shadow people" at night who he also states is afraid will harm him. He denies any delusional thinking.  Perception: wnl.  Judgment: Fair to Good.  Insight: Fair to Good.  Cognition: Oriented to person, place and time.  Sleep:  Number of Hours: 6    Vital Signs:Blood pressure 108/74, pulse 93, temperature 98.3 F (36.8 C), temperature source Oral, resp. rate 18, height 5' 11.5" (1.816 m), weight 99.791 kg (220 lb).  Current Medications: Current  Facility-Administered Medications  Medication Dose Route Frequency Provider Last Rate Last Dose  . acetaminophen (TYLENOL) tablet 650 mg  650 mg Oral Q6H PRN Jorje Guild, PA-C   650 mg at 11/15/11 0981  . alum & mag hydroxide-simeth (MAALOX/MYLANTA) 200-200-20 MG/5ML suspension 30 mL  30 mL Oral Q4H PRN Jorje Guild, PA-C   30 mL at 11/08/11 1040  . antiseptic oral rinse (BIOTENE) solution 15 mL  15 mL Mouth Rinse TID PC & HS Curlene Labrum Kanya Potteiger, MD   15 mL at 11/17/11 1258  . benztropine (COGENTIN) tablet 0.5 mg  0.5 mg Oral TID WC & HS Curlene Labrum Cozette Braggs, MD   0.5 mg at 11/17/11 1140  . carbamazepine (TEGRETOL XR) 12 hr tablet 200 mg  200 mg Oral BH-qamhs Curlene Labrum Hudson Lehmkuhl, MD   200 mg at 11/17/11 0746  . chlorproMAZINE (THORAZINE) tablet 100 mg  100 mg Oral TID Curlene Labrum Babbette Dalesandro, MD   100 mg at 11/17/11 1139  . chlorproMAZINE (THORAZINE) tablet 200 mg  200 mg Oral QHS Viviann Spare, NP   200 mg at 11/16/11 2112  . clonazePAM (KLONOPIN) tablet 1 mg  1 mg Oral TID WC & HS Welton Bord D Stephfon Bovey, MD      . magnesium hydroxide (MILK OF MAGNESIA) suspension 30 mL  30 mL Oral Daily PRN Jorje Guild, PA-C   30 mL at 11/10/11 2143  . mulitivitamin with minerals tablet 1 tablet  1 tablet Oral Daily Curlene Labrum Aldena Worm, MD   1 tablet at 11/17/11 0746  . nicotine (NICODERM CQ - dosed in mg/24 hours) patch 21 mg  21 mg Transdermal Daily Curlene Labrum Hiilani Jetter, MD   21 mg at 11/17/11 9604  . potassium chloride (K-DUR,KLOR-CON) CR tablet 10 mEq  10 mEq Oral BID Curlene Labrum Dove Gresham, MD   10 mEq at 11/17/11 0746  . prazosin (MINIPRESS) capsule 1 mg  1 mg Oral QHS Curlene Labrum Oyindamola Key, MD   1 mg at 11/16/11 2112  . QUEtiapine (SEROQUEL) tablet 200 mg  200 mg Oral QAC breakfast Alyson Kuroski-Mazzei, DO   200 mg at 11/17/11 5409  . QUEtiapine (SEROQUEL) tablet 600 mg  600 mg Oral QHS Verne Spurr, PA-C   600 mg at 11/16/11 2112  . sertraline (ZOLOFT) tablet 100 mg  100 mg Oral Daily Curlene Labrum Cleland Simkins, MD   100 mg at 11/17/11 0746  . sodium  chloride (OCEAN) 0.65 % nasal spray 2 spray  2 spray Each Nare TID WC & HS Curlene Labrum Blayden Conwell, MD   2 spray at 11/17/11 1140  . thiamine (VITAMIN B-1) tablet 100 mg  100 mg Oral Daily Curlene Labrum Lincon Sahlin, MD   100 mg at 11/17/11 0746  . DISCONTD: clonazePAM (KLONOPIN) tablet 0.5 mg  0.5 mg Oral BID Viviann Spare, NP   0.5 mg at 11/17/11 1139  . DISCONTD: clonazePAM (KLONOPIN) tablet 1 mg  1 mg Oral BH-qamhs Viviann Spare, NP   1 mg at 11/17/11 8119   Lab Results: No results found for this or any previous visit (from the past 48 hour(s)).  Time was spent today discussing with the patient his current symptoms. The patient reports that he is sleeping better with the recent increase in the medication Thorazine.  He also states that the severity of his auditory and visual hallucinations have decreased but continue "on and off."  The patient states that he feels his depressive symptoms are under better control but he continues to report significant anxiety symptoms.  Time was spent discussing with the patient his plans at discharge.  He states today that he cannot live with his sister and plans to live in a hotel if his mother can help him with this cost.  Treatment Plan Summary:  1. Daily contact with patient to assess and evaluate symptoms and progress in treatment  2. Medication management  3. The patient will deny suicidal ideations or homicidal ideations for 48 hours prior to discharge and have a depression and anxiety rating of 3 or less. The patient will also deny any auditory or visual hallucinations or delusional thinking.  4. The patient will deny any symptoms of substance withdrawal at time of discharge.   Plan:  1. Will continue the patient on his current medications.  2. Will increase the medication Zoloft to 100 mgs po q am for depression and anxiety.  3. Will increase the medication Klonopin to 1 mg po q 8 am, 12 noon, 5 pm and hs for anxiety. 4. Will continue to monitor.  5.  Laboratory studies reviewed.  6. The patient has been referred to [facility] for longer term treatment and is on the waiting list.  Pragya Lofaso 11/17/2011, 3:15 PM

## 2011-11-18 NOTE — Tx Team (Signed)
Interdisciplinary Treatment Plan Update (Adult)  Date:  11/18/2011  Time Reviewed:  10:15AM-11:00AM  Progress in Treatment: Attending groups:  Yes Participating in groups:    Yes Taking medication as prescribed:    Yes Tolerating medication:   Yes Family/Significant other contact made:  No Patient understands diagnosis:  Yes  Discussing patient identified problems/goals with staff:   Yes Medical problems stabilized or resolved:   Yes Denies suicidal/homicidal ideation:  No, fleeting but states has no intention of acting on them Issues/concerns per patient self-inventory:   None Other:    New problem(s) identified: No, Describe:    Reason for Continuation of Hospitalization: Depression Medication stabilization Suicidal ideation  Interventions implemented related to continuation of hospitalization:  Medication monitoring and adjustment, safety checks Q15 min., suicide risk assessment, group therapy, psychoeducation, collateral contact, aftercare planning, ongoing physician assessments, medication education  Additional comments:  Not applicable  Estimated length of stay:  2-3 days  Discharge Plan:  Follow up with [facility] for med mgmt, [facility] for therapy, [facility] for [employer] Team  New goal(s):  Not applicable  Review of initial/current patient goals per problem list:   1.  Goal(s):  Deny SI for 48 hours prior to D/C.  Met:  No  Target date:  By Discharge   As evidenced by:  Still fleeting SI thoughts, but states he has no intention of acting on them  2.  Goal(s):  Reduce psychosis to baseline.  Met:  Yes  Target date:  By Discharge   As evidenced by:  Voices are decreased in volume significantly  3.  Goal(s):  Decrease depression from admission level to no more than 3 at discharge.  Met:  No  Target date:  By Discharge   As evidenced by:  "4" today  4.  Goal(s):  Reduce anxiety from "9" on 3/19 to no more than 3 at discharge.  Met:  No  Target  date:  By Discharge   As evidenced by:  "7-8" today  5.  Goal(s): Be able to sleep at least 5-6 hours per night.  Met:Yes  Target date:  By Discharge   As evidenced by:  "6" last night  Attendees: Patient:  Did not attend   Family:     Physician:  Dr. Harvie Heck Readling 11/18/2011 10:15AM-11:00AM  Nursing:   Izola Price, RN 11/18/2011 10:15AM -11:00AM   Case Manager:  Ambrose Mantle, LCSW 11/18/2011 10:15AM-11:00AM  Counselor:  Marni Griffon, LCAS 11/18/2011 10:15AM-11:00AM  Other:      Other:      Other:      Other:       Scribe for Treatment Team:   Sarina Ser, 11/18/2011, 10:15AM-11:15AM

## 2011-11-18 NOTE — Progress Notes (Signed)
[facility] Group Notes: (Counselor/Nursing/MHT/Case Management/Adjunct) 11/18/2011   @1 :15pm Self-labeling  Type of Therapy:  Group Therapy  Participation Level:  Did Not Attend    [name] 11/18/2011 2:08 PM

## 2011-11-18 NOTE — Progress Notes (Signed)
Patient up and in the milieu most of the day.  Attending groups on the 500 hallway.  Interacting well with staff and peers.  Requested an updated printout of his medications and their doses and times, which was provided.  He has been compliant with medications and denies medication side effects.  He has been pleasant and cooperative.

## 2011-11-18 NOTE — Progress Notes (Signed)
Grief and Loss Group  Facilitated grief and loss group on [address] [facility] Discussed group rules and privacy/confidentiality. Discussed types of loss (re: death, relationships, self-respect) and grief reactions to loss (re: sadness, depression, anger, isolation). Group was mostly quiet, minor progessive engagement. Group members interacted mostly w/ coun rather than one another despite attempts to redirect and encourage open sharing.  Pt came to group late, was silent and did not participate, though he did listen attentively and thank facilitator after the group ended.  Lizanne Erker B MS, LPCA, NCC

## 2011-11-18 NOTE — Progress Notes (Signed)
[facility] Group Notes: (Counselor/Nursing/MHT/Case Management/Adjunct) 11/18/2011   @ 11:00 Feelings About Diagnosis  Type of Therapy:  Group Therapy  Participation Level:  Good  Participation Quality:  Attentive, Sharing   Affect:  Appropriate  Cognitive:  Appropriate  Insight:  Limited  Engagement in Group:   Good  Engagement in Therapy:  Good  Modes of Intervention:  Support and Exploration  Summary of Progress/Problems: Amanuel explored his personal meaning of recovery, saying that it means that his disorder is no longer running his life and he has more control over his own mind. He went on to process the concept of acceptance, saying that he is trying hard to move on with his life without accepting some of the things he has experienced. Agustin seemed relieved with acceptance was discussed as different from excusing an event or saying it was okay that the event happened.    [name] 11/18/2011  12:41 PM

## 2011-11-18 NOTE — Discharge Planning (Signed)
Met with patient in Aftercare Planning Group.   Facilitated his visit with [facility] for intake.  Patient will be seen by team upon discharge.  CM just needs to call and let them know when he is discharged.  During Aftercare Planning Group, Case Manager provided psychoeducation on "Suicide Prevention Information."  This included descriptions of risk factors for suicide, warning signs that an individual is in crisis and thinking of suicide, and what to do if this occurs.  Pt indicated understanding of information provided, and will read brochure given upon discharge.     During Aftercare Planning Group, Case Manager provided psychoeducation on "Suicide Prevention Information."  This included descriptions of risk factors for suicide, warning signs that an individual is in crisis and thinking of suicide, and what to do if this occurs.  Pt participated enthusiastically, indicated understanding of information provided, and will read brochure given upon discharge.     Ambrose Mantle, LCSW 11/18/2011, 2:07 PM

## 2011-11-18 NOTE — Progress Notes (Signed)
[facility] MD Progress Note  11/18/2011 1:20 PM  Diagnosis:  Axis I: Schizoaffective Disorder - Depressed Type.  Posttraumatic Stress Disorder.  Alcohol Dependence.   The patient was seen today and reports the following:   ADL's: Intact.  Sleep: The patient reports to sleeping much better last night without seeing the "shadow people." Appetite: The patient reports a good appetite today.   Mild>(1-10) >Severe  Hopelessness (1-10): 5  Depression (1-10): 4  Anxiety (1-10): 7-8   Suicidal Ideation: The patient reports "fleeting" suicidal ideations today with no plan or intent.  Plan: No  Intent: No  Means: No  Homicidal Ideation: The patient adamantly denies any homicidal ideations today.  Plan: No  Intent: No.  Means: No   General Appearance/Behavior: The patient remains friendly and cooperative today.  Eye Contact: Good.  Speech: Appropriate in rate and volume with no pressuring of speech noted today.  Motor Behavior: wnl.  Level of Consciousness: Alert and Oriented x 3.  Mental Status: Alert and Oriented x 3.  Mood: Mild to moderate depression today.  Affect: Mild to moderately constricted today.  Anxiety Level: Severe anxiety continues.  Thought Process: Residual psychosis continues.  Thought Content: The patient reports to hearing voices but states that the intensity is less and voices are "more manageable."  He denies any delusional thinking.  Perception: wnl.  Judgment: Fair to Good.  Insight: Fair to Good.  Cognition: Oriented to person, place and time.  Sleep:  Number of Hours: 6    Vital Signs:Blood pressure 108/76, pulse 102, temperature 97 F (36.1 C), temperature source Oral, resp. rate 20, height 5' 11.5" (1.816 m), weight 99.791 kg (220 lb).  Current Medications: Current Facility-Administered Medications  Medication Dose Route Frequency Provider Last Rate Last Dose  . acetaminophen (TYLENOL) tablet 650 mg  650 mg Oral Q6H PRN Jorje Guild, PA-C   650 mg at 11/15/11  1610  . alum & mag hydroxide-simeth (MAALOX/MYLANTA) 200-200-20 MG/5ML suspension 30 mL  30 mL Oral Q4H PRN Jorje Guild, PA-C   30 mL at 11/08/11 1040  . antiseptic oral rinse (BIOTENE) solution 15 mL  15 mL Mouth Rinse TID PC & HS Curlene Labrum Aerial Dilley, MD   15 mL at 11/18/11 0758  . benztropine (COGENTIN) tablet 0.5 mg  0.5 mg Oral TID WC & HS Curlene Labrum Buffie Herne, MD   0.5 mg at 11/18/11 1151  . carbamazepine (TEGRETOL XR) 12 hr tablet 200 mg  200 mg Oral BH-qamhs Curlene Labrum Teletha Petrea, MD   200 mg at 11/18/11 0755  . chlorproMAZINE (THORAZINE) tablet 100 mg  100 mg Oral TID Curlene Labrum Kaytelynn Scripter, MD   100 mg at 11/18/11 1151  . chlorproMAZINE (THORAZINE) tablet 200 mg  200 mg Oral QHS Viviann Spare, NP   200 mg at 11/17/11 2136  . clonazePAM (KLONOPIN) tablet 1 mg  1 mg Oral TID WC & HS Curlene Labrum Jacqueline Spofford, MD   1 mg at 11/18/11 1151  . magnesium hydroxide (MILK OF MAGNESIA) suspension 30 mL  30 mL Oral Daily PRN Jorje Guild, PA-C   30 mL at 11/10/11 2143  . mulitivitamin with minerals tablet 1 tablet  1 tablet Oral Daily Ronny Bacon, MD   1 tablet at 11/18/11 0755  . nicotine (NICODERM CQ - dosed in mg/24 hours) patch 21 mg  21 mg Transdermal Daily Curlene Labrum Elva Mauro, MD   21 mg at 11/18/11 9604  . potassium chloride (K-DUR,KLOR-CON) CR tablet 10 mEq  10 mEq Oral BID  Curlene Labrum Manya Balash, MD   10 mEq at 11/18/11 0756  . prazosin (MINIPRESS) capsule 1 mg  1 mg Oral QHS Curlene Labrum Ludell Zacarias, MD   1 mg at 11/17/11 2135  . QUEtiapine (SEROQUEL) tablet 200 mg  200 mg Oral QAC breakfast Alyson Kuroski-Mazzei, DO   200 mg at 11/18/11 1610  . QUEtiapine (SEROQUEL) tablet 600 mg  600 mg Oral QHS Verne Spurr, PA-C   600 mg at 11/17/11 2135  . sertraline (ZOLOFT) tablet 100 mg  100 mg Oral Daily Curlene Labrum Dejanay Wamboldt, MD   100 mg at 11/18/11 0756  . sodium chloride (OCEAN) 0.65 % nasal spray 2 spray  2 spray Each Nare TID WC & HS Curlene Labrum Kieren Adkison, MD   2 spray at 11/18/11 0800  . thiamine (VITAMIN B-1) tablet 100 mg  100 mg Oral Daily  Curlene Labrum Zyshawn Bohnenkamp, MD   100 mg at 11/18/11 0756  . DISCONTD: clonazePAM (KLONOPIN) tablet 0.5 mg  0.5 mg Oral BID Viviann Spare, NP   0.5 mg at 11/17/11 1139  . DISCONTD: clonazePAM (KLONOPIN) tablet 1 mg  1 mg Oral BH-qamhs Viviann Spare, NP   1 mg at 11/17/11 9604   Lab Results: No results found for this or any previous visit (from the past 48 hour(s)).  The patient was seen today and reports to feeling better.  He states that he slept well last night and did not see the "shadow people."  He reports that his depressive symptoms are improving as well as the intensity of his auditory hallucinations.  The patient continues to report significant anxiety but reports that the increase in Klonopin ordered yesterday did not start until today.  He denies any medication related side effects or other concerns.  Treatment Plan Summary:  1. Daily contact with patient to assess and evaluate symptoms and progress in treatment  2. Medication management  3. The patient will deny suicidal ideations or homicidal ideations for 48 hours prior to discharge and have a depression and anxiety rating of 3 or less. The patient will also deny any auditory or visual hallucinations or delusional thinking.  4. The patient will deny any symptoms of substance withdrawal at time of discharge.   Plan:  1. Will continue the patient on his current medications.  2. Will continue to monitor.  3. Laboratory studies reviewed.  4. The patient has been referred to [facility] for longer term treatment and is on the waiting list.  Mikylah Ackroyd 11/18/2011, 1:20 PM

## 2011-11-18 NOTE — Progress Notes (Signed)
[facility] Group Notes:  (Counselor/Nursing/MHT/Case Management/Adjunct)  11/18/2011 4:05 PM  Type of Therapy:  Group Therapy  Participation Level:  Minimal  Participation Quality:  Attentive  Affect:  Blunted  Cognitive:  Alert  Insight:  Poor  Engagement in Group:  Limited  Engagement in Therapy:  Limited  Modes of Intervention:  Clarification, Education, Limit-setting, Orientation, Problem-solving, Socialization and Support  Summary of Progress/Problems:Summary of Progress/Problems: Pt participated in group by listening attentively and openly disclosing. Therapist prompted Pt to state 2 things he liked about himself.  Therapist asked Pt to identify one thing he would like to change and what he could do to make that change.  Pt reported that he had a good sense of humor and he wants to maintain a stable mood.  Therapist emphasized the importance of engaging in positive activities in order to maintain a balanced lifestyle. Therapist praised Pt for progress made.  Therapist prompted patients to join in the Affirmations Activity.  Pt gave and received positive affirmations and smiled. Some progress noted.  Intervention effective.        Marni Griffon C 11/18/2011, 4:05 PM

## 2011-11-19 MED ORDER — LORATADINE 10 MG PO TABS
10.0000 mg | ORAL_TABLET | Freq: Every day | ORAL | Status: DC
  Administered 2011-11-19 – 2011-11-21 (×3): 10 mg via ORAL
  Filled 2011-11-19 (×3): qty 1
  Filled 2011-11-19 (×2): qty 14
  Filled 2011-11-19: qty 1

## 2011-11-19 MED ORDER — SERTRALINE HCL 50 MG PO TABS
150.0000 mg | ORAL_TABLET | Freq: Every day | ORAL | Status: DC
  Administered 2011-11-20 – 2011-11-21 (×2): 150 mg via ORAL
  Filled 2011-11-19: qty 3
  Filled 2011-11-19: qty 42
  Filled 2011-11-19 (×2): qty 3
  Filled 2011-11-19: qty 42

## 2011-11-19 NOTE — Progress Notes (Signed)
11/19/2011         Time: 0930      Group Topic/Focus: The focus of this group is on enhancing patients' problem solving skills, which involves identifying the problem, brainstorming solutions and choosing and trying a solution.   Participation Level: Active  Participation Quality: Appropriate  Affect: Attentive  Cognitive: Appropriate   Additional Comments: None.   [name] 11/19/2011 1:42 PM

## 2011-11-19 NOTE — Progress Notes (Signed)
Has been visible in dayroom, but generally withdrawn to self. Appears flat and depressed, but does brighten at intervals during assessment. Calm and cooperative. No acute distress noted. States he feels like he is continuing to improve. Continues to have AH ,but the intensity has decreased. Also continues to endorse passive SI without a plan a contracts for safety. Met with [facility] today and he felt like it went well. Is hopeful their services will be helpful. Support and encouragement provided. Did inquire about the next step with [facility] and asked if he needed to do anything else. Encouraged to follow up with CM tomorrow for more insight into process. Denies pain. Denies HI/VH. Offered no additional questions or concerns. POC and medications for the shift reviewed and understanding verbalized. Safety has been maintained with Q15 minute observation. Will continue current POC.

## 2011-11-19 NOTE — Discharge Planning (Signed)
Met with patient in Aftercare Planning Group.   He continues to ask questions about his discharge, wants every specific detail taken care of.  Utilization review done for additional days.  Ambrose Mantle, LCSW 11/19/2011, 12:04 PM

## 2011-11-19 NOTE — Progress Notes (Signed)
[facility] Group Notes: (Counselor/Nursing/MHT/Case Management/Adjunct) 11/19/2011    11:00am Emotion Regulation  Type of Therapy:  Group Therapy  Participation Level:  Good  Participation Quality:  Appropriate, Attentive, Sharing  Affect:  Blunted  Cognitive:  Appropriate  Insight:  Good  Engagement in Group: Good  Engagement in Therapy:  Good  Modes of Intervention:  Support and Exploration  Summary of Progress/Problems: [name] explored regulating his emotions, stating that he often gets overwhelmed with several emotions at a time. He talked about how emotions pile on top of him and make him feel so out of control that he just "shuts down" and goes numb from emotions. [name] related well to other group members who processed physical and cognitive responses to overwhelming emotions.  [name] 11/19/2011 12:52 PM

## 2011-11-19 NOTE — Progress Notes (Signed)
[facility] MD Progress Note  11/19/2011 2:12 PM  Diagnosis:  Axis I: Schizoaffective Disorder - Depressed Type.  Posttraumatic Stress Disorder.  Alcohol Dependence.   The patient was seen today and reports the following:   ADL's: Intact.  Sleep: The patient reports to continuing to sleep much better at night without seeing the "shadow people."  Appetite: The patient reports a good appetite today.   Mild>(1-10) >Severe  Hopelessness (1-10): 5  Depression (1-10): 5  Anxiety (1-10): 7   Suicidal Ideation: The patient reports ongoing "fleeting" suicidal ideations today but states he does not want to harm himself.  Plan: No  Intent: No  Means: No  Homicidal Ideation: The patient adamantly denies any homicidal ideations today.  Plan: No  Intent: No.  Means: No   General Appearance/Behavior: The patient remains friendly and cooperative today.  Eye Contact: Good.  Speech: Appropriate in rate and volume with no pressuring of speech noted today.  Motor Behavior: wnl.  Level of Consciousness: Alert and Oriented x 3.  Mental Status: Alert and Oriented x 3.  Mood: Mild to moderate depression today.  Affect: Mild to moderately constricted today.  Anxiety Level: Severe anxiety continues.  Thought Process: Residual psychosis continues.  Thought Content: The patient reports to hearing voices but again states that the intensity is less and voices are "more manageable." He denies any delusional thinking.  Perception: wnl.  Judgment: Fair to Good.  Insight: Fair to Good.  Cognition: Oriented to person, place and time.  Sleep:  Number of Hours: 5.5    Vital Signs:Blood pressure 107/71, pulse 98, temperature 98.5 F (36.9 C), temperature source Oral, resp. rate 18, height 5' 11.5" (1.816 m), weight 99.791 kg (220 lb).  Current Medications: Current Facility-Administered Medications  Medication Dose Route Frequency Provider Last Rate Last Dose  . acetaminophen (TYLENOL) tablet 650 mg  650 mg Oral  Q6H PRN Jorje Guild, PA-C   650 mg at 11/19/11 0743  . alum & mag hydroxide-simeth (MAALOX/MYLANTA) 200-200-20 MG/5ML suspension 30 mL  30 mL Oral Q4H PRN Jorje Guild, PA-C   30 mL at 11/08/11 1040  . antiseptic oral rinse (BIOTENE) solution 15 mL  15 mL Mouth Rinse TID PC & HS Curlene Labrum Malaia Buchta, MD   15 mL at 11/19/11 1251  . benztropine (COGENTIN) tablet 0.5 mg  0.5 mg Oral TID WC & HS Curlene Labrum Nikyla Navedo, MD   0.5 mg at 11/19/11 1153  . carbamazepine (TEGRETOL XR) 12 hr tablet 200 mg  200 mg Oral BH-qamhs Curlene Labrum Marianita Botkin, MD   200 mg at 11/19/11 0743  . chlorproMAZINE (THORAZINE) tablet 100 mg  100 mg Oral TID Curlene Labrum Hassani Sliney, MD   100 mg at 11/19/11 1153  . chlorproMAZINE (THORAZINE) tablet 200 mg  200 mg Oral QHS Viviann Spare, NP   200 mg at 11/18/11 2142  . clonazePAM (KLONOPIN) tablet 1 mg  1 mg Oral TID WC & HS Curlene Labrum Jahon Bart, MD   1 mg at 11/19/11 1153  . magnesium hydroxide (MILK OF MAGNESIA) suspension 30 mL  30 mL Oral Daily PRN Jorje Guild, PA-C   30 mL at 11/10/11 2143  . mulitivitamin with minerals tablet 1 tablet  1 tablet Oral Daily Ronny Bacon, MD   1 tablet at 11/19/11 0743  . nicotine (NICODERM CQ - dosed in mg/24 hours) patch 21 mg  21 mg Transdermal Daily Curlene Labrum Miley Blanchett, MD   21 mg at 11/19/11 0631  . potassium chloride (K-DUR,KLOR-CON) CR  tablet 10 mEq  10 mEq Oral BID Curlene Labrum Alpha Chouinard, MD   10 mEq at 11/19/11 0743  . prazosin (MINIPRESS) capsule 1 mg  1 mg Oral QHS Curlene Labrum Atalie Oros, MD   1 mg at 11/18/11 2142  . QUEtiapine (SEROQUEL) tablet 200 mg  200 mg Oral QAC breakfast Alyson Kuroski-Mazzei, DO   200 mg at 11/19/11 9147  . QUEtiapine (SEROQUEL) tablet 600 mg  600 mg Oral QHS Verne Spurr, PA-C   600 mg at 11/18/11 2141  . sertraline (ZOLOFT) tablet 100 mg  100 mg Oral Daily Curlene Labrum Majesta Leichter, MD   100 mg at 11/19/11 0743  . sodium chloride (OCEAN) 0.65 % nasal spray 2 spray  2 spray Each Nare TID WC & HS Curlene Labrum Hurshel Bouillon, MD   2 spray at 11/19/11 1152  . thiamine  (VITAMIN B-1) tablet 100 mg  100 mg Oral Daily Curlene Labrum Ariah Mower, MD   100 mg at 11/19/11 8295   Lab Results: No results found for this or any previous visit (from the past 48 hour(s)).  The patient was seen today and states that he is continuing to sleep better at night.  He also reports ongoing improving with the intensity of his auditory and visual hallucinations.  The patient reports to having fleeting suicidal ideations but reports that he has no intent or plan to harm himself.  He does report to having an increase in the severity of his anxiety when possible discharge was discussed.  The patient states that he does feel he may feel ready for discharge by the end of the week.  Treatment Plan Summary:  1. Daily contact with patient to assess and evaluate symptoms and progress in treatment  2. Medication management  3. The patient will deny suicidal ideations or homicidal ideations for 48 hours prior to discharge and have a depression and anxiety rating of 3 or less. The patient will also deny any auditory or visual hallucinations or delusional thinking.  4. The patient will deny any symptoms of substance withdrawal at time of discharge.   Plan:  1. Will continue the patient on his current medications.  2. Will increase the medication Zoloft to 150 mgs po q am to further address his depression. 3. Will continue to monitor.  4. Laboratory studies reviewed.  5. The patient has been referred to [facility] for longer term treatment and is on the waiting list. 6. Possible discharge this week.  Shaunice Levitan 11/19/2011, 2:12 PM

## 2011-11-19 NOTE — Progress Notes (Signed)
Pt seems to be doing a lot better.  He rated both his depression and hopelessness a 5 and his anxiety a 8 on his self-inventory.  He state,"my anxiety goes down once I take my medication"  He did admit to still hearing voices but "not as intense" and denied any visual hallucinations for the past couple of nights.  He did c/o headache which was relived by tylenol that was given at 0743.  He is talking more about his plans for discharge.  He did admit to some passive S/I but felt it was nothing he would ever act on.  He is wanting to be sure that he knows the day before he is discharged.  He stated,"I need time to call my sister and I need to get a cell phone"  He went on to say that if the doctor felt he was ready to leave now he was okay with leaving.  He feels his medications are right now.  Informed Dr. Allena Katz of pt's wishes about knowing the day before and Dr. Allena Katz did voice understanding.

## 2011-11-20 NOTE — Progress Notes (Signed)
Currently resting quietly in bed in supine position with eyes closed. Respirations are even and unlabored. No acute pain or distress noted. Safety has been maintained with Q15minute observation. Will continue current POC. 

## 2011-11-20 NOTE — Progress Notes (Addendum)
[facility] Group Notes: (Counselor/Nursing/MHT/Case Management/Adjunct) 11/13/2011   @  11:00am  Finding Balance in Life  Type of Therapy:  Group Therapy  Participation Level:  Minimal  Participation Quality:  Drowsy   Affect:  Blunted  Cognitive:  Appropriate  Insight:  None  Engagement in Group: Minimal  Engagement in Therapy:  None  Modes of Intervention:  Support and Exploration  Summary of Progress/Problems: [name] was very drowsy and fell asleep through much of group.  11/20/2011   12:39 PM      [facility] Group Notes: (Counselor/Nursing/MHT/Case Management/Adjunct) 11/20/2011   @1 :15pm  Type of Therapy:  Group Therapy  Participation Level:  Minimal  Participation Quality:  Drowsy   Affect:  Blunted  Cognitive:  Appropriate  Insight:  Limited  Engagement in Group: Limited  Engagement in Therapy:  Limited  Modes of Intervention:  Support and Exploration  Summary of Progress/Problems: [name] slept through much of group  [name] 11/20/2011 3:10 PM

## 2011-11-20 NOTE — Progress Notes (Signed)
Pt is flat/depressed and reports passive si thoughts during the day. Pt denies si thoughts at present time. He denies hi thoughts or voices. Pt has a list of his medications and writer called out each medication as given to patient. He rates his depression as a 5 on a 1-10 scale with 10 being the most depressed. Offered support, encouragement and 15 minute checks. Safety maintained on unit.

## 2011-11-20 NOTE — Discharge Planning (Signed)
Met with patient in Aftercare Planning Group.   He stated he feels fine about being discharged tomorrow.  Case Manager asked if he felt pride in what he has accomplished during stay, and he said he sees little change, but admitted that other people have across the board told him he looks better.  We discussed how with his 39yo daughter, he cannot see change if with her all the time, but can see it if away for awhile.  He wanted to clarify all discharge plans, is almost child-like in how much he wants to go over every detail again and again.  We did this again, and Case Manager informed him re upcoming appointment with therapist Rona Ravens at [facility] on 11/24/11 at 4pm.  CM also contacted [facility] [employer] [facility] for an appointment.  Patient stated that the first thing he will do at discharge is get a phone and then call [facility] with his new number and address.   Patient may go to a boarding house on [address] that his sister is investigating, will know by later today.  Or may stay with brother a few days.  Went through list of hotels that would be suitable as well.  Informed patient his entire stay to date has been sponsored, and he will not receive bill.  However, also informed him that it is unknown whether more days could be obtained.  Ambrose Mantle, LCSW 11/20/2011, 10:12 AM

## 2011-11-20 NOTE — Progress Notes (Signed)
Patient ID: [name], male   DOB: Jan 26, 1971, 41 y.o.   MRN: [id number]  Objective: [name] see in the consult room today, he is fully alert and cooperative with a fairly bright affect - affect is full - smiles for me.  Eye contact is good.  Thinking is linear and goal directed.  His thoughts are well organized and reflect well thought-through questions. Denies suicidal intent though he reports intermittent thoughts. Cognition fully intact with adequate insight.    Subjective: Reports sleep is good and appetite good.  He denies suicidal intent - occasionally has transient suicidal thoughts which he dismisses - reports no intent to harm himself.  Denies HI.  Rates his depression a 4/10 on 1-10 scale if 10 is worst symptoms.  Anxiety is 7/10, and Hopelessness a 5/10. He asks when he can tell his brother to pick him up.  He also asks me to D/C his minipress since he thinks he doesn't need it anymore and I agree.    We reviewed medications, compliance measures, importance of compliance.  He voices his understanding and intent to comply with the medications. He is pleased to be working with the [facility] team and I have given him encouragement with this.    Labs reviewed and no new findings.    Plan: Plan reviewed with team who agree he is ready for discharge tomorrow.  Will stop Minipress at patient request and agree that the increase in Thorazine to 200mg  at bedtime should be enough to handle his sleep issues.  Have reviewed these findings and the plan with Dr. Allena Katz and he is in agreement.

## 2011-11-21 MED ORDER — LORATADINE 10 MG PO TABS: 10.0000 mg | ORAL_TABLET | Freq: Every day | ORAL | Status: DC

## 2011-11-21 MED ORDER — THIAMINE HCL 100 MG PO TABS: 100.0000 mg | ORAL_TABLET | Freq: Every day | ORAL | Status: AC

## 2011-11-21 MED ORDER — BIOTENE DRY MOUTH MT LIQD: 15.0000 mL | Freq: Three times a day (TID) | OROMUCOSAL | Status: DC

## 2011-11-21 MED ORDER — BENZTROPINE MESYLATE 0.5 MG PO TABS: 0.5000 mg | ORAL_TABLET | Freq: Three times a day (TID) | ORAL | Status: DC

## 2011-11-21 MED ORDER — POTASSIUM CHLORIDE CRYS ER 10 MEQ PO TBCR: 10.0000 meq | EXTENDED_RELEASE_TABLET | Freq: Two times a day (BID) | ORAL | Status: DC

## 2011-11-21 MED ORDER — ADULT MULTIVITAMIN W/MINERALS CH: 1.0000 | ORAL_TABLET | Freq: Every day | ORAL | Status: DC

## 2011-11-21 MED ORDER — SERTRALINE HCL 50 MG PO TABS: 150.0000 mg | ORAL_TABLET | Freq: Every day | ORAL | Status: DC

## 2011-11-21 MED ORDER — CLONAZEPAM 1 MG PO TABS: 1.0000 mg | ORAL_TABLET | Freq: Three times a day (TID) | ORAL | Status: DC

## 2011-11-21 MED ORDER — CHLORPROMAZINE HCL 100 MG PO TABS: ORAL_TABLET | ORAL | Status: DC

## 2011-11-21 MED ORDER — CARBAMAZEPINE ER 200 MG PO TB12: 200.0000 mg | ORAL_TABLET | ORAL | Status: DC

## 2011-11-21 MED ORDER — QUETIAPINE FUMARATE 200 MG PO TABS: ORAL_TABLET | ORAL | Status: DC

## 2011-11-21 NOTE — Progress Notes (Signed)
[facility] Group Notes: (Counselor/Nursing/MHT/Case Management/Adjunct) 11/21/2011   @1 :15pm  Type of Therapy:  Group Therapy  Participation Level:  Active  Participation Quality:  Attentive, Sharing, Appropriate   Affect:  Appropriate  Cognitive:  Appropriate  Insight:  Good  Engagement in Group: Good  Engagement in Therapy:  Good  Modes of Intervention:  Support and Exploration  Summary of Progress/Problems: [name] was very engage in discussion of programs and services provided by [facility]. He expressed interest in the services.  [name] 11/21/2011 3:00 PM

## 2011-11-21 NOTE — Progress Notes (Signed)
[facility] Case Management Discharge Plan:  Will you be returning to the same living situation after discharge: No.Will be discussing with sister/brother & following their advice about where to go, possibly one of their houses or else hotel At discharge, do you have transportation home?:Yes,  sister/brother to pick up Do you have the ability to pay for your medications:No.  Given 2 weeks of samples (except Klonopin, given 1 week), will go to [facility] for PAP  Interagency Information:     Release of information consent forms completed and in the chart;  Patient's signature needed at discharge.  Patient to Follow up at:  Follow-up Information    Follow up with [facility] on 11/24/2011. (Between 2-3 PM  (Call them with your location so that can come to you))    Contact information:   [facility] [address] [place]  [address] Telephone:  [phone]      Follow up with [facility] on 11/25/2011. [facility] is open Monday-Friday 8:00AM-3:00PM (earlier in the day = better))    Contact information:   [facility] [address] [place]  [address] Telephone:  [phone]      Follow up with Rona Ravens on 11/24/2011. (4:00PM appointment)    Contact information:   [facility] [address] Telephone:  [phone]      Follow up with Disability application on 11/26/2011. (9AM appointment)    Contact information:   [employer] [address] [facility] [place] Telephone:  [phone]      Follow up with [facility]. (Classes are Monday-Thursday 10:00-11:30AM as needed)    Contact information:   [facility] [place] [address] [place] Telephone:  [phone]         Patient denies SI/HI:   Yes,  has periodic fleeting (1-2 second) thoughts, no intention to Academic librarian and Suicide Prevention  discussed:  Yes,  Periodically during stay and again today during Aftercare Planning Group, Case Manager provided psychoeducation on "Suicide Prevention Information."  This included descriptions of risk factors for suicide, warning signs that an individual is in crisis and thinking of suicide, and what to do if this occurs.  Pt indicated understanding of information provided, and will read brochure given upon discharge.     Barrier to discharge identified:No.  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  Go to [employer] for socialization and psychoeducation.  [facility] for [employer].  [facility] for counseling.   Sarina Ser 11/21/2011, 12:49 PM

## 2011-11-21 NOTE — Tx Team (Signed)
Interdisciplinary Treatment Plan Update (Adult)  Date:  11/21/2011  Time Reviewed:  10:15AM-11:00AM  Progress in Treatment: Attending groups:  Yes Participating in groups:    Yes, fully engaged Taking medication as prescribed:    Yes Tolerating medication:   Yes Family/Significant other contact made:  Yes, with brother to provide SPI Patient understands diagnosis:   Yes Discussing patient identified problems/goals with staff:   Yes Medical problems stabilized or resolved:   Yes Denies suicidal/homicidal ideation:  Yes Issues/concerns per patient self-inventory:   None Other:    New problem(s) identified: No, Describe:    Reason for Continuation of Hospitalization: None  Interventions implemented related to continuation of hospitalization:  Medication monitoring and adjustment, safety checks Q15 min., suicide risk assessment, group therapy, psychoeducation, collateral contact, aftercare planning, ongoing physician assessments, medication education - until discharge  Additional comments:  To be given 2 week supply of all meds except Klonopin, and will get one week of that  Estimated length of stay:  Discharge today  Discharge Plan:  Follow up with [facility] for med mgmt, [facility] for [facility], [facility] for [employer] [facility]  New goal(s):  Not applicable  Review of initial/current patient goals per problem list:   1.  Goal(s):  Deny SI for 48 hours prior to D/C.  Met:  Yes  Target date:  By Discharge   As evidenced by:  Fleeting (1-2 seconds) occasionally, but no intention of acting on it  2.  Goal(s):  Reduce psychosis to baseline.  Met:  Yes  Target date:  By Discharge   As evidenced by:  Shadows not seen for 4 days, greatly diminished auditory hallucinations, "very manageable"  3.  Goal(s):  Decrease depression from admission level to no more than 3 at discharge.  Met:  Yes  Target date:  By Discharge   As evidenced by:  "4" today -   4.  Goal(s):  Reduce  anxiety from "9" on 3/19 to no more than 3 at discharge.  Met:  No  Target date:  By Discharge   As evidenced by:  "7" today, feels this is his baseline, states that he just worries about things  Attendees: Patient:  [name]  11/21/2011 10:15AM-11:00AM  Family:     Physician:  Dr. Harvie Heck Readling 11/21/2011 10:15AM-11:00AM  Nursing:   Tacy Learn, RN 11/21/2011 10:15AM -11:00AM   Case Manager:  Ambrose Mantle, LCSW 11/21/2011 10:15AM-11:00AM  Counselor:     Other:   Lynann Bologna, NP 11/21/2011 10:15AM-11:00AM  Other:      Other:      Other:       Scribe for Treatment Team:   Sarina Ser, 11/21/2011, 10:15AM-11:15AM
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
## 2011-11-21 NOTE — Progress Notes (Signed)
[facility] Group Notes: (Counselor/Nursing/MHT/Case Management/Adjunct) 11/21/2011   @11 :00am Preventing Relapse  Type of Therapy:  Group Therapy  Participation Level:  Active  Participation Quality: Attentive, Appropriate, Sharing    Affect:  Blunted  Cognitive:  Appropriate  Insight:  Good  Engagement in Group: Good  Engagement in Therapy:  Good  Modes of Intervention:  Support and Exploration  Summary of Progress/Problems: [name] was attentive ans explored possible triggers for relapse. He reported that he is trying not to relapse into suicidality or cutting/burning himself. Wise processed how all triggers are not negative, and that sometimes good things can trigger him as well. He shared that his main triggers are anniversaries.   [name] 11/21/2011 12:23 PM

## 2011-11-21 NOTE — Progress Notes (Signed)
Pt d/c from hospital with sister. All items returned. D/C instructions given and prescriptions given. Pt reports passive si and reports that he does not plan on acting on his thoughts. Pt given suicide prevention information.

## 2011-11-21 NOTE — Progress Notes (Signed)
Pt has been pleasant and cooperative  He expresses hopefullness over his discharge tomorrow and reports he is not real sure if he will stay with his brother or sister when he leaves the hospital  He denies suicidal and homicidal ideation and denies voices presently   He attended group and is compliant with treatment  His behavior and interactions with others is appropriate  Verbal support given  Medications administered and effectiveness monitored  Q 15 min checks  Pt safe at present

## 2011-11-24 ENCOUNTER — Encounter (HOSPITAL_COMMUNITY): Payer: Self-pay | Admitting: Emergency Medicine

## 2011-11-24 ENCOUNTER — Emergency Department (HOSPITAL_COMMUNITY)
Admission: EM | Admit: 2011-11-24 | Discharge: 2011-11-25 | Disposition: A | Discharge status: Home / Self Care | Payer: Self-pay

## 2011-11-24 DIAGNOSIS — F17200 Nicotine dependence, unspecified, uncomplicated: Secondary | ICD-10-CM | POA: Insufficient documentation

## 2011-11-24 DIAGNOSIS — F4310 Post-traumatic stress disorder, unspecified: Secondary | ICD-10-CM | POA: Insufficient documentation

## 2011-11-24 DIAGNOSIS — F259 Schizoaffective disorder, unspecified: Secondary | ICD-10-CM | POA: Insufficient documentation

## 2011-11-24 DIAGNOSIS — R569 Unspecified convulsions: Secondary | ICD-10-CM | POA: Insufficient documentation

## 2011-11-24 DIAGNOSIS — F10929 Alcohol use, unspecified with intoxication, unspecified: Secondary | ICD-10-CM

## 2011-11-24 DIAGNOSIS — F3289 Other specified depressive episodes: Secondary | ICD-10-CM | POA: Insufficient documentation

## 2011-11-24 DIAGNOSIS — R45851 Suicidal ideations: Secondary | ICD-10-CM | POA: Insufficient documentation

## 2011-11-24 DIAGNOSIS — F329 Major depressive disorder, single episode, unspecified: Secondary | ICD-10-CM | POA: Insufficient documentation

## 2011-11-24 DIAGNOSIS — F1010 Alcohol abuse, uncomplicated: Secondary | ICD-10-CM | POA: Insufficient documentation

## 2011-11-24 LAB — URINALYSIS, ROUTINE W REFLEX MICROSCOPIC
Bilirubin Urine: NEGATIVE
Glucose, UA: NEGATIVE mg/dL
Hgb urine dipstick: NEGATIVE
Ketones, ur: NEGATIVE mg/dL
Leukocytes, UA: NEGATIVE
Nitrite: NEGATIVE
Protein, ur: NEGATIVE mg/dL
Specific Gravity, Urine: 1.011 (ref 1.005–1.030)
Urobilinogen, UA: 0.2 mg/dL (ref 0.0–1.0)
pH: 6.5 (ref 5.0–8.0)

## 2011-11-24 LAB — RAPID URINE DRUG SCREEN, HOSP PERFORMED
Amphetamines: NOT DETECTED
Barbiturates: NOT DETECTED
Benzodiazepines: POSITIVE — AB
Cocaine: NOT DETECTED
Opiates: NOT DETECTED
Tetrahydrocannabinol: POSITIVE — AB

## 2011-11-24 NOTE — ED Notes (Signed)
Pt states he is here because he has been having suicidal thoughts   Has hx of same

## 2011-11-24 NOTE — ED Notes (Signed)
Pt has urine specimen available if needed.

## 2011-11-24 NOTE — ED Notes (Signed)
Pt changed into blue paper scrubs. Pt searched and wanded by security. Pt belongings searched and wanded by security and placed at nurses's station.

## 2011-11-25 LAB — BASIC METABOLIC PANEL
BUN: 13 mg/dL (ref 6–23)
CO2: 25 mEq/L (ref 19–32)
Calcium: 9.6 mg/dL (ref 8.4–10.5)
Chloride: 99 mEq/L (ref 96–112)
Creatinine, Ser: 1 mg/dL (ref 0.50–1.35)
GFR calc Af Amer: >90 mL/min (ref >90)
GFR calc non Af Amer: >90 mL/min (ref >90)
Glucose, Bld: 108 mg/dL — ABNORMAL HIGH (ref 70–99)
Potassium: 3.4 mEq/L — ABNORMAL LOW (ref 3.5–5.1)
Sodium: 137 mEq/L (ref 135–145)

## 2011-11-25 LAB — CBC
HCT: 41.9 % (ref 39.0–52.0)
Hemoglobin: 14.2 g/dL (ref 13.0–17.0)
MCH: 30.3 pg (ref 26.0–34.0)
MCHC: 33.9 g/dL (ref 30.0–36.0)
MCV: 89.5 fL (ref 78.0–100.0)
Platelets: 286 10*3/uL (ref 150–400)
RBC: 4.68 MIL/uL (ref 4.22–5.81)
RDW: 13.1 % (ref 11.5–15.5)
WBC: 8.4 10*3/uL (ref 4.0–10.5)

## 2011-11-25 LAB — ETHANOL: Alcohol, Ethyl (B): 134 mg/dL — ABNORMAL HIGH (ref 0–11)

## 2011-11-25 MED ORDER — ALUM & MAG HYDROXIDE-SIMETH 200-200-20 MG/5ML PO SUSP
30.0000 mL | ORAL | Status: DC | PRN
  Administered 2011-11-25: 30 mL via ORAL
  Filled 2011-11-25: qty 30

## 2011-11-25 MED ORDER — ADULT MULTIVITAMIN W/MINERALS CH
1.0000 | ORAL_TABLET | Freq: Every day | ORAL | Status: DC
  Administered 2011-11-25: 1 via ORAL
  Filled 2011-11-25: qty 1

## 2011-11-25 MED ORDER — LORAZEPAM 1 MG PO TABS: 0.0000 mg | ORAL_TABLET | Freq: Two times a day (BID) | ORAL | Status: DC

## 2011-11-25 MED ORDER — IBUPROFEN 600 MG PO TABS: 600.0000 mg | ORAL_TABLET | Freq: Three times a day (TID) | ORAL | Status: DC | PRN

## 2011-11-25 MED ORDER — FOLIC ACID 1 MG PO TABS
1.0000 mg | ORAL_TABLET | Freq: Every day | ORAL | Status: DC
  Administered 2011-11-25: 1 mg via ORAL
  Filled 2011-11-25: qty 1

## 2011-11-25 MED ORDER — THIAMINE HCL 100 MG/ML IJ SOLN: 100.0000 mg | Freq: Every day | INTRAMUSCULAR | Status: DC

## 2011-11-25 MED ORDER — NICOTINE 21 MG/24HR TD PT24: 21.0000 mg | MEDICATED_PATCH | Freq: Every day | TRANSDERMAL | Status: DC

## 2011-11-25 MED ORDER — LORAZEPAM 1 MG PO TABS: 1.0000 mg | ORAL_TABLET | Freq: Three times a day (TID) | ORAL | Status: DC | PRN

## 2011-11-25 MED ORDER — LORAZEPAM 1 MG PO TABS: 1.0000 mg | ORAL_TABLET | Freq: Four times a day (QID) | ORAL | Status: DC | PRN

## 2011-11-25 MED ORDER — LORAZEPAM 1 MG PO TABS
0.0000 mg | ORAL_TABLET | Freq: Four times a day (QID) | ORAL | Status: DC
  Administered 2011-11-25 (×2): 1 mg via ORAL
  Filled 2011-11-25 (×2): qty 1

## 2011-11-25 MED ORDER — VITAMIN B-1 100 MG PO TABS
100.0000 mg | ORAL_TABLET | Freq: Every day | ORAL | Status: DC
  Administered 2011-11-25: 100 mg via ORAL
  Filled 2011-11-25: qty 1

## 2011-11-25 MED ORDER — LORAZEPAM 2 MG/ML IJ SOLN: 1.0000 mg | Freq: Four times a day (QID) | INTRAMUSCULAR | Status: DC | PRN

## 2011-11-25 MED ORDER — ONDANSETRON HCL 4 MG PO TABS: 4.0000 mg | ORAL_TABLET | Freq: Three times a day (TID) | ORAL | Status: DC | PRN

## 2011-11-25 NOTE — ED Notes (Signed)
Pt's room smelled of cigarette smoke. Pt informed of non smoking policy by security. Pt verbalized understanding.

## 2011-11-25 NOTE — ED Provider Notes (Signed)
Medical screening examination/treatment/procedure(s) were performed by non-physician practitioner and as supervising physician I was immediately available for consultation/collaboration.  Gregory Barrick M Seven [place], MD 11/25/11 0851 

## 2011-11-25 NOTE — Progress Notes (Signed)
Patient Discharge Instructions:  Psychiatric Admission Assessment Note Provided,  11/25/2011 After Visit Summary (AVS) Provided,  11/25/2011 Face Sheet Provided, 11/25/2011 Faxed/Sent to the Next Level Care provider:  11/25/2011 Provided Suicide Risk Assessment - Discharge Assessment 11/25/2011  Faxed top [facility] the [place] @ [phone] And to [facility] @ [phone] And to [facility] Support Team @ [phone]  Wandra Scot, 11/25/2011, 11:00 AM
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
## 2011-11-25 NOTE — BH Assessment (Signed)
Assessment Note   [name] is an 41 y.o. male. Pt came to [facility] over night intoxicated and endorsing SI thoughts and AVH. CSW assessed patient this morning and pt reports he has no recollection of the events last night, but he knows he "messed up" and got drunk. Pt denies any current SI and denies any SI report last night, but admits again that he was intoxicated.   CSW reviewed pt's discharge plan with him from his [facility] admission and he reports following up with the support he was set up with. Pt also admits to med compliance, and believes his behavior and thoughts were as a result of his alcohol intake.  CSW discussed findings with EDP who agreed with discharge recommendation. Pt signed no harm contract and provided with bus pass for transportation.  No further needs at this time.  Axis I: Mood Disorder NOS and Substance Abuse Axis II: Deferred Axis III:  Past Medical History  Diagnosis Date  . Depression   . Psychosis   . PTSD (post-traumatic stress disorder)   . Seizure disorder     related to etoh seizure  . Alcohol abuse   . Schizoaffective disorder    Axis IV: other psychosocial or environmental problems, problems related to social environment, problems with access to health care services and problems with primary support group Axis V: 51-60 moderate symptoms  Past Medical History:  Past Medical History  Diagnosis Date  . Depression   . Psychosis   . PTSD (post-traumatic stress disorder)   . Seizure disorder     related to etoh seizure  . Alcohol abuse   . Schizoaffective disorder     Past Surgical History  Procedure Date  . Foot surgery     right    Family History: History reviewed. No pertinent family history.  Social History:  reports that he has been smoking Cigarettes.  He has been smoking about 1.5 packs per day. He has never used smokeless tobacco. He reports that he drinks alcohol. He reports that he uses illicit drugs (Marijuana).  Additional Social  History:    Allergies: No Known Allergies  Home Medications:  Medications Prior to Admission  Medication Dose Route Frequency Provider Last Rate Last Dose  . alum & mag hydroxide-simeth (MAALOX/MYLANTA) 200-200-20 MG/5ML suspension 30 mL  30 mL Oral PRN Grant Fontana, PA   30 mL at 11/25/11 0405  . folic acid (FOLVITE) tablet 1 mg  1 mg Oral Daily Grant Fontana, [place]   1 mg at 11/25/11 0957  . ibuprofen (ADVIL,MOTRIN) tablet 600 mg  600 mg Oral Q8H PRN Grant Fontana, [place]      . LORazepam (ATIVAN) tablet 1 mg  1 mg Oral Q6H PRN Grant Fontana, PA       Or  . LORazepam (ATIVAN) injection 1 mg  1 mg Intravenous Q6H PRN Grant Fontana, PA      . LORazepam (ATIVAN) tablet 0-4 mg  0-4 mg Oral Q6H Grant Fontana, [place]   1 mg at 11/25/11 0957   Followed by  . LORazepam (ATIVAN) tablet 0-4 mg  0-4 mg Oral Q12H Grant Fontana, [place]      . LORazepam (ATIVAN) tablet 1 mg  1 mg Oral Q8H PRN Grant Fontana, PA      . mulitivitamin with minerals tablet 1 tablet  1 tablet Oral Daily Grant Fontana, [place]   1 tablet at 11/25/11 [phone]  . nicotine (NICODERM CQ - dosed in mg/24 hours) patch 21 mg  21 mg  Transdermal Daily Grant Fontana, [place]      . ondansetron [facility]) tablet 4 mg  4 mg Oral Q8H PRN Grant Fontana, [place]      . thiamine (VITAMIN B-1) tablet 100 mg  100 mg Oral Daily Grant Fontana, [place]   100 mg at 11/25/11 0957   Or  . thiamine (B-1) injection 100 mg  100 mg Intravenous Daily Grant Fontana, [place]       Medications Prior to Admission  Medication Sig Dispense Refill  . antiseptic oral rinse (BIOTENE) LIQD 15 mLs by Mouth Rinse route 4 (four) times daily - after meals and at bedtime. For dry mouth.      . benztropine (COGENTIN) 0.5 MG tablet Take 1 tablet (0.5 mg total) by mouth 4 (four) times daily -  with meals and at bedtime. For eps.  120 tablet  0  . carbamazepine (TEGRETOL XR) 200 MG 12 hr tablet Take 1 tablet (200 mg total) by mouth 2 (two) times daily  in the am and at bedtime.. For seizure prevention.  60 tablet  0  . chlorproMAZINE (THORAZINE) 100 MG tablet Take one tablet (100mg ) three times daily, and 2 tablets (200mg ) daily at bedtime. For mood stability and hallucinations.  150 tablet  0  . clonazePAM (KLONOPIN) 1 MG tablet Take 1 tablet (1 mg total) by mouth 4 (four) times daily -  with meals and at bedtime. For anxiety and sleep.  120 tablet  0  . loratadine (CLARITIN) 10 MG tablet Take 1 tablet (10 mg total) by mouth daily. For allergies.  30 tablet  0  . Multiple Vitamin (MULITIVITAMIN WITH MINERALS) TABS Take 1 tablet by mouth daily. Vitamin supplement.  30 tablet  0  . potassium chloride (K-DUR,KLOR-CON) 10 MEQ tablet Take 1 tablet (10 mEq total) by mouth 2 (two) times daily. Potassium supplement.  60 tablet  0  . QUEtiapine (SEROQUEL) 200 MG tablet Take one tablet in the morning (200mg ) and 3 tablets (600mg ) daily at bedtime.  For psychosis.  120 tablet  0  . sertraline (ZOLOFT) 50 MG tablet Take 3 tablets (150 mg total) by mouth daily. For depression  90 tablet  0  . thiamine 100 MG tablet Take 1 tablet (100 mg total) by mouth daily. Vitamin B supplement.  30 tablet  0    OB/GYN Status:  No LMP for male patient.  General Assessment Data Location of Assessment: [facility] ED Living Arrangements: Alone Can pt return to current living arrangement?: Yes Admission Status: Voluntary Is patient capable of signing voluntary admission?: Yes Transfer from: Acute Hospital  Education Status Is patient currently in school?: No  Risk to self Suicidal Ideation: No-Not Currently/Within Last 6 Months Suicidal Intent: No-Not Currently/Within Last 6 Months Is patient at risk for suicide?: No Suicidal Plan?: No Access to Means: No What has been your use of drugs/alcohol within the last 12 months?: Alcohol Previous Attempts/Gestures: Yes How many times?: 4  Other Self Harm Risks: Poor med compliance Triggers for Past Attempts:  Unpredictable Intentional Self Injurious Behavior: None Family Suicide History: No Recent stressful life event(s): Job Loss;Financial Problems Persecutory voices/beliefs?: No Depression: Yes Depression Symptoms: Feeling angry/irritable;Loss of interest in usual pleasures;Feeling worthless/self pity Substance abuse history and/or treatment for substance abuse?: Yes Suicide prevention information given to non-admitted patients: Not applicable  Risk to Others Homicidal Ideation: No Thoughts of Harm to Others: No Current Homicidal Intent: No Current Homicidal Plan: No Access to Homicidal Means: No History of harm to others?: No  Assessment of Violence: None Noted Does patient have access to weapons?: No Criminal Charges Pending?: No Does patient have a court date: No  Psychosis Hallucinations: None noted Delusions: None noted  Mental Status Report Appear/Hygiene: Improved Eye Contact: Good Motor Activity: Freedom of movement Speech: Logical/coherent Level of Consciousness: Quiet/awake;Alert Mood: Depressed;Anxious Affect: Appropriate to circumstance Anxiety Level: None Thought Processes: Coherent;Relevant Judgement: Impaired  Cognitive Functioning Concentration: Normal Memory: Recent Intact;Remote Intact IQ: Average Insight: Good Impulse Control: Fair Appetite: Poor Sleep: No Change Total Hours of Sleep: 6  Vegetative Symptoms: None  Prior Inpatient Therapy Prior Inpatient Therapy: Yes Prior Therapy Dates: 2012 and before moving to [place] Prior Therapy Facilty/Provider(s): [facility] Reason for Treatment: psychosis, depression  Prior Outpatient Therapy Prior Outpatient Therapy: No            Values / Beliefs Cultural Requests During Hospitalization: None Spiritual Requests During Hospitalization: None        Additional Information 1:1 In Past 12 Months?: No CIRT Risk: No Elopement Risk: No Does patient have medical  clearance?: Yes     Disposition:  Disposition Disposition of Patient: Outpatient treatment Type of inpatient treatment program: Adult Type of outpatient treatment: Adult  On Site Evaluation by:   Reviewed with Physician:     Marlaine Hind ANN S 11/25/2011 11:12 AM

## 2011-11-25 NOTE — ED Provider Notes (Signed)
Filed Vitals:   11/25/11 0423  BP: 117/76  Pulse: 92  Temp:   Resp:     Patient no longer suicidal. Able to contract for safety. Patient will be discharged in good condition.  Cyndra Numbers, MD 11/25/11 [phone]

## 2011-11-25 NOTE — Discharge Instructions (Signed)
Alcohol Intoxication  You have alcohol intoxication when the amount of alcohol that you have consumed has impaired your ability to mentally and physically function. There are a variety of factors that contribute to the level at which alcohol intoxication can occur, such as age, gender, weight, frequency of alcohol consumption, medication use, and the presence of other medical conditions, such as diabetes, seizures, or heart conditions.  The blood alcohol level test measures the concentration of alcohol in your blood. In most states, your blood alcohol level must be lower than 80 mg/dL (0.08%) to legally drive. However, many dangerous effects of alcohol can occur at much lower levels.  Alcohol directly impairs the normal chemical activity of the brain and is said to be a chemical depressant. Alcohol can cause drowsiness, stupor, respiratory failure, and coma. Other physical effects can include headache, vomiting, vomiting of blood, abdominal pain, a fast heartbeat, difficulty breathing, anxiety, and amnesia. Alcohol intoxication can also lead to dangerous and life-threatening activities, such as fighting, dangerous operation of vehicles or heavy machinery, and risky sexual behavior.  Alcohol can be especially dangerous when taken with other drugs. Some of these drugs are:   Sedatives.   Painkillers.   Marijuana.   Tranquilizers.   Antihistamines.   Muscle relaxants.   Seizure medicine.  Many of the effects of acute alcohol intoxication are temporary. However, repeated alcohol intoxication can lead to severe medical illnesses. If you have alcohol intoxication, you should:   Stay hydrated. Drink enough water and fluids to keep your urine clear or pale yellow. Avoid excessive caffeine because this can further lead to dehydration.   Eat a healthy diet. You may have residual nausea, headache, and loss of appetite, but it is still important that you maintain good nutrition. You can start with clear  liquids.   Take nonsteroidal anti-inflammatory medications as needed for headaches, but make sure to do so with small meals. You should avoid acetaminophen for several days after having alcohol intoxication because the combination of alcohol and acetaminophen can be toxic to your liver.  If you have frequent alcohol intoxication, ask your friends and family if they think you have a drinking problem. For further help, contact:   Your caregiver.   Alcoholics Anonymous (AA).   A drug or alcohol rehabilitation program.  SEEK MEDICAL CARE IF:    You have persistent vomiting.   You have persistent pain in any part of your body.   You do not feel better after a few days.  SEEK IMMEDIATE MEDICAL CARE IF:    You become shaky or tremble when you try to stop drinking.   You shake uncontrollably (seizure).   You throw up (vomit) blood. This may be bright red or it may look like black coffee grounds.   You have blood in the stool. This may be bright red or appear as a black, tarry, bad smelling stool.   You become lightheaded or faint.  ANY OF THESE SYMPTOMS MAY REPRESENT A SERIOUS PROBLEM THAT IS AN EMERGENCY. Do not wait to see if the symptoms will go away. Get medical help right away. Call your local emergency services (911 in [place] DO NOT drive yourself to the hospital.  MAKE SURE YOU:    Understand these instructions.   Will watch your condition.   Will get help right away if you are not doing well or get worse.  Document Released: 05/14/2005 Document Revised: 07/24/2011 Document Reviewed: 01/21/2010  [employer] Patient Information 2012 [employer].

## 2011-11-25 NOTE — ED Notes (Signed)
Pt reports he walked on at [facility] tonight because he was having CAH telling him that he is worthless and would be better off dead, pt states he had a plan to cut wrist with a knife, pt reports multiple suicide attempts in the past. Pt reports VH at times of shadows, pt appears and admits to being confused, states its because of all the voices he is hearing in his head. Pt states he does not want to be hospitalized he wants to be set up with ACT team/outpatient tx. Pt anxious and cooperative.

## 2011-11-25 NOTE — ED Notes (Signed)
Act in to speak with pt 

## 2011-11-25 NOTE — [facility] Counselor (Signed)
This writer attempted to assess pt but he was too drowsy and not making any sense during assessment.  Next [facility] person will need to assess later when pt is more coherent.

## 2011-12-01 NOTE — Discharge Summary (Signed)
Physician Discharge Summary Note  Patient:  [name] is an 41 y.o., male MRN:  [id number] DOB:  1971-05-25 Patient phone:  [phone] (home)  Patient address:   [address] [place] [address],   Date of Admission:  10/30/2011 Date of Discharge: 11/21/2011  Reason for Admission:  Discharge Diagnoses: Principal Problem:  *Schizoaffective disorder, depressive type Active Problems:  Post traumatic stress disorder (PTSD)  Alcohol dependence   Axis Diagnosis:   AXIS I:  Schizoaffective Disorder, Depressed Type; Alcohol Dependence AXIS II:  No diagnosis AXIS III:  No diagnosis Past Medical History  Diagnosis Date  . Depression   . Psychosis   . PTSD (post-traumatic stress disorder)   . Seizure disorder     related to etoh seizure  . Alcohol abuse   . Schizoaffective disorder    AXIS IV:  Homeless; unemployed AXIS V:  51-60 moderate symptoms  Level of Care:  OP  Hospital Course:  This was one of several admissions for [name] who is well known to [place]. He has a history of schizoaffective disorder, chronic depression, and alcohol abuse. Presented in the emergency room requesting help with his depression, reported that he had been seeing "visions of shadow people" he came to him at night and he felt them physically pressing him on him while he tried to sleep. Also reported hearing the voices of [name] and [name] threatening to harm him. At the time of admission he rated his depression and 8/10 on a 1-10 scale with 10 being the worst symptoms and anxiety a 9/10.  He was admitted for acute stabilization unit or we elected to continue Tegretol that he been taking. We added Zoloft to control his depressive symptoms and Seroquel for auditory hallucinations and psychotic symptoms. Medications were titrated to effectiveness. He was medically stable throughout his stay, calm and cooperative, with persistent complaints of anxiety. No problems with extrapyramidal symptoms,  or akathisia.  By the end of his stay the anxiety was decreased to 7/10 and he felt that it is manageable. His depression was decreased to 4/10, and hopelessness 7/10. He reported that the voices were under her better control than they have been in an entire year. Was having fleeting suicidal thoughts that would last one to 2 seconds upper manageable and he had no intentions of harming himself.  Homelessness was an issue for him he had the support of his mother who was willing to pay for hotel room for him until she could get moved to [place] so he could move in with her. He also had a brother in the area that is supportive of him.  Consults: Nutrition Consult due to antipsychotic treatment.  Significant Diagnostic Studies:    Discharge Vitals:   Blood pressure 118/81, pulse 92, temperature 97.7 F (36.5 C), temperature source Oral, resp. rate 20, height 5' 11.5" (1.816 m), weight 99.791 kg (220 lb).  Mental Status Exam: See Mental Status Examination and Suicide Risk Assessment completed by Attending Physician prior to discharge.  Discharge destination:  Home  Is patient on multiple antipsychotic therapies at discharge:  No   Has Patient had three or more failed trials of antipsychotic monotherapy by history:  No  Recommended Plan for Multiple Antipsychotic Therapies: N/A   Medication List  As of 12/01/2011 10:21 AM   STOP taking these medications         FLUoxetine 20 MG capsule      gabapentin 300 MG capsule  TAKE these medications      Indication    antiseptic oral rinse Liqd   15 mLs by Mouth Rinse route 4 (four) times daily - after meals and at bedtime. For dry mouth.       benztropine 0.5 MG tablet   Commonly known as: COGENTIN   Take 1 tablet (0.5 mg total) by mouth 4 (four) times daily -  with meals and at bedtime. For eps.       carbamazepine 200 MG 12 hr tablet   Commonly known as: TEGRETOL XR   Take 1 tablet (200 mg total) by mouth 2 (two) times daily  in the am and at bedtime.. For seizure prevention.       chlorproMAZINE 100 MG tablet   Commonly known as: THORAZINE   Take one tablet (100mg ) three times daily, and 2 tablets (200mg ) daily at bedtime. For mood stability and hallucinations.       clonazePAM 1 MG tablet   Commonly known as: KLONOPIN   Take 1 tablet (1 mg total) by mouth 4 (four) times daily -  with meals and at bedtime. For anxiety and sleep.       loratadine 10 MG tablet   Commonly known as: CLARITIN   Take 1 tablet (10 mg total) by mouth daily. For allergies.       mulitivitamin with minerals Tabs   Take 1 tablet by mouth daily. Vitamin supplement.       potassium chloride 10 MEQ tablet   Commonly known as: K-DUR,KLOR-CON   Take 1 tablet (10 mEq total) by mouth 2 (two) times daily. Potassium supplement.       QUEtiapine 200 MG tablet   Commonly known as: SEROQUEL   Take one tablet in the morning (200mg ) and 3 tablets (600mg ) daily at bedtime.  For psychosis.       sertraline 50 MG tablet   Commonly known as: ZOLOFT   Take 3 tablets (150 mg total) by mouth daily. For depression       thiamine 100 MG tablet   Take 1 tablet (100 mg total) by mouth daily. Vitamin B supplement.            Follow-up Information    Follow up with [facility] on 11/24/2011. (Between 2-3 PM  (Call them with your location so that can come to you))    Contact information:   [facility] [address] [place]  [address] Telephone:  [phone]      Follow up with [facility] on 11/25/2011. [facility] is open Monday-Friday 8:00AM-3:00PM (earlier in the day = better))    Contact information:   [facility] [address] [place]  [address] Telephone:  [phone]      Follow up with [name] on 11/24/2011. (4:00PM appointment)    Contact information:   [facility] [address] [place] Telephone:  [phone]      Follow up with  Disability application on 11/26/2011. (9AM appointment)    Contact information:   [employer] [address] [facility] [place] Telephone:  [phone]      Follow up with [facility]. (Classes are Monday-Thursday 10:00-11:30AM as needed)    Contact information:   [facility] [address] [place] Telephone:  [phone]         Follow-up recommendations:  Activity:  unrestricted Diet:  regular  Comments:  If Babak presents again with suicidal thoughts, it may be appropriate to consider hospitalization [facility] for [facility] treatment.  Signed: Danial Sisley A 12/01/2011, 10:21 AM

## 2011-12-02 ENCOUNTER — Emergency Department (HOSPITAL_COMMUNITY)
Admission: EM | Admit: 2011-12-02 | Discharge: 2011-12-08 | Disposition: A | Discharge status: Home / Self Care | Payer: PRIVATE HEALTH INSURANCE | Attending: Emergency Medicine | Admitting: Emergency Medicine

## 2011-12-02 ENCOUNTER — Encounter (HOSPITAL_COMMUNITY): Payer: Self-pay

## 2011-12-02 DIAGNOSIS — F259 Schizoaffective disorder, unspecified: Secondary | ICD-10-CM | POA: Insufficient documentation

## 2011-12-02 DIAGNOSIS — R443 Hallucinations, unspecified: Secondary | ICD-10-CM | POA: Insufficient documentation

## 2011-12-02 DIAGNOSIS — R569 Unspecified convulsions: Secondary | ICD-10-CM | POA: Insufficient documentation

## 2011-12-02 DIAGNOSIS — Z79899 Other long term (current) drug therapy: Secondary | ICD-10-CM | POA: Insufficient documentation

## 2011-12-02 DIAGNOSIS — F29 Unspecified psychosis not due to a substance or known physiological condition: Secondary | ICD-10-CM

## 2011-12-02 LAB — COMPREHENSIVE METABOLIC PANEL
ALT: 20 U/L (ref 0–53)
AST: 42 U/L — ABNORMAL HIGH (ref 0–37)
Albumin: 4.8 g/dL (ref 3.5–5.2)
Alkaline Phosphatase: 107 U/L (ref 39–117)
BUN: 19 mg/dL (ref 6–23)
CO2: 25 mEq/L (ref 19–32)
Calcium: 9.8 mg/dL (ref 8.4–10.5)
Chloride: 96 mEq/L (ref 96–112)
Creatinine, Ser: 1.37 mg/dL — ABNORMAL HIGH (ref 0.50–1.35)
GFR calc Af Amer: 74 mL/min — ABNORMAL LOW (ref >90)
GFR calc non Af Amer: 64 mL/min — ABNORMAL LOW (ref >90)
Glucose, Bld: 106 mg/dL — ABNORMAL HIGH (ref 70–99)
Potassium: 4.1 mEq/L (ref 3.5–5.1)
Sodium: 137 mEq/L (ref 135–145)
Total Bilirubin: 0.3 mg/dL (ref 0.3–1.2)
Total Protein: 8.1 g/dL (ref 6.0–8.3)

## 2011-12-02 LAB — CBC
HCT: 41.8 % (ref 39.0–52.0)
Hemoglobin: 14.5 g/dL (ref 13.0–17.0)
MCH: 30.9 pg (ref 26.0–34.0)
MCHC: 34.7 g/dL (ref 30.0–36.0)
MCV: 88.9 fL (ref 78.0–100.0)
Platelets: 297 10*3/uL (ref 150–400)
RBC: 4.7 MIL/uL (ref 4.22–5.81)
RDW: 13.3 % (ref 11.5–15.5)
WBC: 11.2 10*3/uL — ABNORMAL HIGH (ref 4.0–10.5)

## 2011-12-02 LAB — ETHANOL: Alcohol, Ethyl (B): <11 mg/dL (ref 0–11)

## 2011-12-02 MED ORDER — NICOTINE 21 MG/24HR TD PT24
21.0000 mg | MEDICATED_PATCH | Freq: Every day | TRANSDERMAL | Status: DC
  Administered 2011-12-03 – 2011-12-08 (×6): 21 mg via TRANSDERMAL
  Filled 2011-12-02 (×6): qty 1

## 2011-12-02 MED ORDER — CARBAMAZEPINE ER 200 MG PO TB12
200.0000 mg | ORAL_TABLET | ORAL | Status: DC
  Administered 2011-12-03 – 2011-12-08 (×12): 200 mg via ORAL
  Filled 2011-12-02 (×17): qty 1

## 2011-12-02 MED ORDER — BENZTROPINE MESYLATE 1 MG PO TABS
0.5000 mg | ORAL_TABLET | Freq: Three times a day (TID) | ORAL | Status: DC
  Administered 2011-12-03: 0.5 mg via ORAL
  Administered 2011-12-03: 13:00:00 via ORAL
  Administered 2011-12-03 – 2011-12-07 (×16): 0.5 mg via ORAL
  Administered 2011-12-07: 19:00:00 via ORAL
  Administered 2011-12-07 – 2011-12-08 (×4): 0.5 mg via ORAL
  Filled 2011-12-02 (×23): qty 1

## 2011-12-02 MED ORDER — CLONAZEPAM 1 MG PO TABS
1.0000 mg | ORAL_TABLET | Freq: Three times a day (TID) | ORAL | Status: DC
  Administered 2011-12-03 – 2011-12-08 (×23): 1 mg via ORAL
  Filled 2011-12-02 (×23): qty 1

## 2011-12-02 MED ORDER — ZOLPIDEM TARTRATE 5 MG PO TABS
5.0000 mg | ORAL_TABLET | Freq: Every evening | ORAL | Status: DC | PRN
  Administered 2011-12-03 – 2011-12-07 (×3): 5 mg via ORAL
  Filled 2011-12-02 (×3): qty 1

## 2011-12-02 MED ORDER — SERTRALINE HCL 50 MG PO TABS
150.0000 mg | ORAL_TABLET | Freq: Every day | ORAL | Status: DC
  Administered 2011-12-03 – 2011-12-08 (×6): 150 mg via ORAL
  Filled 2011-12-02 (×6): qty 3

## 2011-12-02 MED ORDER — ONDANSETRON HCL 4 MG PO TABS: 4.0000 mg | ORAL_TABLET | Freq: Three times a day (TID) | ORAL | Status: DC | PRN

## 2011-12-02 MED ORDER — LORAZEPAM 1 MG PO TABS: 1.0000 mg | ORAL_TABLET | Freq: Three times a day (TID) | ORAL | Status: DC | PRN

## 2011-12-02 MED ORDER — QUETIAPINE FUMARATE 100 MG PO TABS
600.0000 mg | ORAL_TABLET | Freq: Every day | ORAL | Status: DC
  Administered 2011-12-03 – 2011-12-07 (×5): 600 mg via ORAL
  Filled 2011-12-02 (×5): qty 6

## 2011-12-02 MED ORDER — IBUPROFEN 600 MG PO TABS
600.0000 mg | ORAL_TABLET | Freq: Three times a day (TID) | ORAL | Status: DC | PRN
  Administered 2011-12-05 – 2011-12-07 (×3): 600 mg via ORAL
  Filled 2011-12-02 (×3): qty 1

## 2011-12-02 MED ORDER — CHLORPROMAZINE HCL 25 MG PO TABS
100.0000 mg | ORAL_TABLET | Freq: Three times a day (TID) | ORAL | Status: DC
  Administered 2011-12-03 – 2011-12-08 (×18): 100 mg via ORAL
  Filled 2011-12-02 (×3): qty 4
  Filled 2011-12-02: qty 1
  Filled 2011-12-02 (×15): qty 4

## 2011-12-02 NOTE — ED Notes (Signed)
Pt placed in conference room  Pt states he is having visual and audible hallucinations  Pt states he has had this for the past 10 yrs  Pt states he thinks his medications need to be readjusted  Pt states he has thoughts of hurting himself but not killing himself  Elsie Stain, [facility] notified  [address] provided

## 2011-12-02 NOTE — ED Provider Notes (Signed)
History     CSN: [id number]  Arrival date & time 12/02/11  2059   First MD Initiated Contact with Patient 12/02/11 2258      Chief Complaint  Patient presents with  . Medical Clearance     The history is provided by the patient.   patient reports a history of psychosis and posttraumatic stress disorder as well as anxiety disorder.  He has been noncompliant with his medications, and reports increasing auditory and visual hallucinations.  He reports he see shadows.  He has occasional suicidal thoughts.  He reports the shadows tell him to hurt himself.  He is unable to telemetry Y. is not compliant with his medications.  It does not sound to be a monetary issue.  He reports is no longer drinking alcohol.  He presents to the ER today requesting help with his hallucinations.  Past Medical History  Diagnosis Date  . Depression   . Psychosis   . PTSD (post-traumatic stress disorder)   . Seizure disorder     related to etoh seizure  . Alcohol abuse   . Schizoaffective disorder     Past Surgical History  Procedure Date  . Foot surgery     right    History reviewed. No pertinent family history.  History  Substance Use Topics  . Smoking status: Current Everyday Smoker -- 1.5 packs/day    Types: Cigarettes  . Smokeless tobacco: Never Used  . Alcohol Use: Yes     occassional      Review of Systems  All other systems reviewed and are negative.    Allergies  Review of patient's allergies indicates no known allergies.  Home Medications   Current Outpatient Rx  Name Route Sig Dispense Refill  . BIOTENE DRY MOUTH MT LIQD Mouth Rinse 15 mLs by Mouth Rinse route 4 (four) times daily - after meals and at bedtime. For dry mouth.    . ASPIRIN 325 MG PO TABS Oral Take 325 mg by mouth 2 (two) times daily as needed. For pain.    [name] BENZTROPINE MESYLATE 0.5 MG PO TABS Oral Take 1 tablet (0.5 mg total) by mouth 4 (four) times daily -  with meals and at bedtime. For eps. 120 tablet 0    . CARBAMAZEPINE ER 200 MG PO TB12 Oral Take 1 tablet (200 mg total) by mouth 2 (two) times daily in the am and at bedtime.. For seizure prevention. 60 tablet 0  . CHLORPROMAZINE HCL 100 MG PO TABS  Take one tablet (100mg ) three times daily, and 2 tablets (200mg ) daily at bedtime. For mood stability and hallucinations. 150 tablet 0  . CLONAZEPAM 1 MG PO TABS Oral Take 1 tablet (1 mg total) by mouth 4 (four) times daily -  with meals and at bedtime. For anxiety and sleep. 120 tablet 0  . LORATADINE 10 MG PO TABS Oral Take 1 tablet (10 mg total) by mouth daily. For allergies. 30 tablet 0  . ADULT MULTIVITAMIN W/MINERALS CH Oral Take 1 tablet by mouth daily. Vitamin supplement. 30 tablet 0  . POTASSIUM CHLORIDE CRYS ER 10 MEQ PO TBCR Oral Take 1 tablet (10 mEq total) by mouth 2 (two) times daily. Potassium supplement. 60 tablet 0  . QUETIAPINE FUMARATE 200 MG PO TABS  Take one tablet in the morning (200mg ) and 3 tablets (600mg ) daily at bedtime.  For psychosis. 120 tablet 0  . SERTRALINE HCL 50 MG PO TABS Oral Take 3 tablets (150 mg total) by mouth daily.  For depression 90 tablet 0  . THIAMINE HCL 100 MG PO TABS Oral Take 1 tablet (100 mg total) by mouth daily. Vitamin B supplement. 30 tablet 0    BP 115/80  Pulse 118  Temp(Src) 98.5 F (36.9 C) (Oral)  Resp 18  SpO2 92%  Physical Exam  Nursing note and vitals reviewed. Constitutional: He is oriented to person, place, and time. He appears well-developed and well-nourished.  HENT:  Head: Normocephalic and atraumatic.  Eyes: EOM are normal.  Neck: Normal range of motion.  Cardiovascular: Normal rate, regular rhythm, normal heart sounds and intact distal pulses.   Pulmonary/Chest: Effort normal and breath sounds normal. No respiratory distress.  Abdominal: Soft. He exhibits no distension. There is no tenderness.  Musculoskeletal: Normal range of motion.  Neurological: He is alert and oriented to person, place, and time.  Skin: Skin is warm  and dry.  Psychiatric:       Bizarre affect.  Appears to be responding to internal stimuli    ED Course  Procedures (including critical care time)  Labs Reviewed  CBC - Abnormal; Notable for the following:    WBC 11.2 (*)    All other components within normal limits  COMPREHENSIVE METABOLIC PANEL - Abnormal; Notable for the following:    Glucose, Bld 106 (*)    Creatinine, Ser 1.37 (*)    AST 42 (*)    GFR calc non Af Amer 64 (*)    GFR calc Af Amer 74 (*)    All other components within normal limits  ETHANOL  URINE RAPID DRUG SCREEN (HOSP PERFORMED)   No results found.   No diagnosis found.    MDM  Auditory hallucinations. Vague SI. No HI. Will involve ACT. Medication noncompliance        Lyanne Co, MD 12/03/11 [phone]

## 2011-12-03 DIAGNOSIS — R443 Hallucinations, unspecified: Secondary | ICD-10-CM

## 2011-12-03 DIAGNOSIS — F329 Major depressive disorder, single episode, unspecified: Secondary | ICD-10-CM

## 2011-12-03 DIAGNOSIS — F1210 Cannabis abuse, uncomplicated: Secondary | ICD-10-CM

## 2011-12-03 DIAGNOSIS — F3289 Other specified depressive episodes: Secondary | ICD-10-CM

## 2011-12-03 LAB — RAPID URINE DRUG SCREEN, HOSP PERFORMED
Amphetamines: NOT DETECTED
Barbiturates: NOT DETECTED
Benzodiazepines: POSITIVE — AB
Cocaine: NOT DETECTED
Opiates: NOT DETECTED
Tetrahydrocannabinol: POSITIVE — AB

## 2011-12-03 MED ORDER — LORATADINE 10 MG PO TABS
10.0000 mg | ORAL_TABLET | Freq: Every day | ORAL | Status: DC
  Administered 2011-12-03 – 2011-12-08 (×6): 10 mg via ORAL
  Filled 2011-12-03 (×7): qty 1

## 2011-12-03 MED ORDER — ADULT MULTIVITAMIN W/MINERALS CH
1.0000 | ORAL_TABLET | Freq: Every day | ORAL | Status: DC
  Administered 2011-12-03 – 2011-12-08 (×6): 1 via ORAL
  Filled 2011-12-03 (×6): qty 1

## 2011-12-03 NOTE — ED Notes (Signed)
Times watch removed from patient.  Locked with his belongings in locker 41.

## 2011-12-03 NOTE — BH Assessment (Signed)
Assessment Note   [name] is an 41 y.o. male admitted from home after telling his brother that he was wanting to kill himself by cutting his wrists.  Patient states that she has had AH and VH's for over 10 years and currently has [facility].  States he still feels suicidal and canl not contract for safety Patient states that he hears 2 voices- "Jesus and the [place]"- both provide persecutory voices towards patient that he is "worthless and they want to kill me."  He showed this Clinical research associate a large tattoo on his right arm of [place]- he stated that he got it for "protection" but the voices well him they will skin the tattoo from his arm. He states that at night he see's "shadowy figures" that induces fear; they normally do not speak but he states that a few months ago they said "SOON" repeatedly to him. He does not know the meaning of this.  Patient reports long term depression and a 14 year history of cutting and burning.  He states that his conditions worsened in 2009 when his fiance' committed suicide in "in front of him" and he was unable to revive her.  States that he began drinking heavily at this time.  Patient stated that he had not drank since the end of 2012 but medical records reports admit to [facility] for suicidal ideations and intoxication on 11/25/10.  He then stated that he only drank to try to quiet the voices but it did not work. He denies any current alcohol use; does use marijuana occasionally. He states that it helps to calm the voices. Patient is currently residing in a boarding home; the rent is paid for by his mother who lives out of state.  He is still attempting to get disability and states that he has a new application in place. Patient's recent admit to [facility] from 10/30/11-11/21/11 resulted in a referral to [facility] [place]. He states that he called them yesterday but did not tell them he was having suicidal thoughts. Discussed with patient the services of the ACT team and he was  strongly encouraged to contact them in the future. He verbalized understanding. Patient states that he has a brother and a sister who live in [place] but their support is nominal.  "They don't understand what I"m going through and feel like I should just ignore the voices."  He hopes that his mother will move to [place] as she is attempting to be as supportive as possible.        Axis I: Schizoaffective disorder, Depressive type Axis II: Deferred Axis III:  Past Medical History  Diagnosis Date  . Depression   . Psychosis   . PTSD (post-traumatic stress disorder)   . Seizure disorder     related to etoh seizure  . Alcohol abuse   . Schizoaffective disorder    Axis IV: economic problems, problems with access to health care services, psychosocial and environmental problems, problems with primary support group Axis V: 31-40 impairment in reality testing  Past Medical History:  Past Medical History  Diagnosis Date  . Depression   . Psychosis   . PTSD (post-traumatic stress disorder)   . Seizure disorder     related to etoh seizure  . Alcohol abuse   . Schizoaffective disorder     Past Surgical History  Procedure Date  . Foot surgery     right    Family History: History reviewed. No pertinent family history.  Social  History:  reports that he has been smoking Cigarettes.  He has been smoking about 1.5 packs per day. He has never used smokeless tobacco. He reports that he drinks alcohol. He reports that he uses illicit drugs (Marijuana).  Additional Social History:    Additional Social History:  Alcohol/Drug Use History of alcohol/drug Use?: Substance #1 Name of Substance 1: ETOH  Age of First Use:  19 1- Amount  (size/oz): Beer, liquor- varied 2- Frequency: weekly until 2009- began to binge 1- Duration: 22 years 1- Last Use/Amount: 11/29/11 1 Name of Substance 2: Marijuana 2 Age of First Use:19 1 Amount  (size/oz):2 cigarettes 1 Frequency: weekly (varies) 1 Duration: 22  years 1 Last Use/Amount:: a few weeks ago   Allergies: No Known Allergies  Home Medications:  Medications Prior to Admission  Medication Dose Route Frequency Provider Last Rate Last Dose  . benztropine (COGENTIN) tablet 0.5 mg  0.5 mg Oral TID WC & HS Lyanne Co, MD      . carbamazepine (TEGRETOL XR) 12 hr tablet 200 mg  200 mg Oral BH-qamhs Lyanne Co, MD   200 mg at 12/03/11 0900  . chlorproMAZINE (THORAZINE) tablet 100 mg  100 mg Oral TID Lyanne Co, MD   100 mg at 12/03/11 1006  . clonazePAM (KLONOPIN) tablet 1 mg  1 mg Oral TID WC & HS Lyanne Co, MD   1 mg at 12/03/11 1250  . ibuprofen (ADVIL,MOTRIN) tablet 600 mg  600 mg Oral Q8H PRN Lyanne Co, MD      . loratadine (CLARITIN) tablet 10 mg  10 mg Oral Daily Lyanne Co, MD   10 mg at 12/03/11 1007  . LORazepam (ATIVAN) tablet 1 mg  1 mg Oral Q8H PRN Lyanne Co, MD      . mulitivitamin with minerals tablet 1 tablet  1 tablet Oral Daily Lyanne Co, MD   1 tablet at 12/03/11 1007  . nicotine (NICODERM CQ - dosed in mg/24 hours) patch 21 mg  21 mg Transdermal Daily Lyanne Co, MD   21 mg at 12/03/11 1007  . ondansetron (ZOFRAN) tablet 4 mg  4 mg Oral Q8H PRN Lyanne Co, MD      . QUEtiapine (SEROQUEL) tablet 600 mg  600 mg Oral QHS Lyanne Co, MD      . sertraline (ZOLOFT) tablet 150 mg  150 mg Oral Daily Lyanne Co, MD   150 mg at 12/03/11 1006  . zolpidem (AMBIEN) tablet 5 mg  5 mg Oral QHS PRN Lyanne Co, MD   5 mg at 12/03/11 0020   Medications Prior to Admission  Medication Sig Dispense Refill  . antiseptic oral rinse (BIOTENE) LIQD 15 mLs by Mouth Rinse route 4 (four) times daily - after meals and at bedtime. For dry mouth.      . benztropine (COGENTIN) 0.5 MG tablet Take 1 tablet (0.5 mg total) by mouth 4 (four) times daily -  with meals and at bedtime. For eps.  120 tablet  0  . carbamazepine (TEGRETOL XR) 200 MG 12 hr tablet Take 1 tablet (200 mg total) by mouth 2 (two) times  daily in the am and at bedtime.. For seizure prevention.  60 tablet  0  . chlorproMAZINE (THORAZINE) 100 MG tablet Take one tablet (100mg ) three times daily, and 2 tablets (200mg ) daily at bedtime. For mood stability and hallucinations.  150 tablet  0  . clonazePAM (KLONOPIN) 1  MG tablet Take 1 tablet (1 mg total) by mouth 4 (four) times daily -  with meals and at bedtime. For anxiety and sleep.  120 tablet  0  . loratadine (CLARITIN) 10 MG tablet Take 1 tablet (10 mg total) by mouth daily. For allergies.  30 tablet  0  . Multiple Vitamin (MULITIVITAMIN WITH MINERALS) TABS Take 1 tablet by mouth daily. Vitamin supplement.  30 tablet  0  . potassium chloride (K-DUR,KLOR-CON) 10 MEQ tablet Take 1 tablet (10 mEq total) by mouth 2 (two) times daily. Potassium supplement.  60 tablet  0  . QUEtiapine (SEROQUEL) 200 MG tablet Take one tablet in the morning (200mg ) and 3 tablets (600mg ) daily at bedtime.  For psychosis.  120 tablet  0  . sertraline (ZOLOFT) 50 MG tablet Take 3 tablets (150 mg total) by mouth daily. For depression  90 tablet  0  . thiamine 100 MG tablet Take 1 tablet (100 mg total) by mouth daily. Vitamin B supplement.  30 tablet  0    OB/GYN Status:  No LMP for male patient.  General Assessment Data Location of Assessment: [facility] ED ACT Assessment: Yes Living Arrangements: Alone Can pt return to current living arrangement?: Yes (Lives in a Boarding house) Admission Status: Voluntary Is patient capable of signing voluntary admission?: Yes Transfer from: Home Referral Source: Self/Family/Friend  Education Status Is patient currently in school?: No Current Grade: NA Highest grade of school patient has completed: NA Name of school: NA Contact person: NA  Risk to self Suicidal Ideation: Yes-Currently Present Suicidal Intent: No Is patient at risk for suicide?: Yes Suicidal Plan?: Yes-Currently Present Specify Current Suicidal Plan:  (Plans to cut wrists with razor blades) Access  to Means: Yes Specify Access to Suicidal Means:  (has razors or other sharp items at home) What has been your use of drugs/alcohol within the last 12 months?:  (ETOH, Marijuana) Previous Attempts/Gestures: Yes How many times?: 4  Other Self Harm Risks:  (Not taking meds as prescribed) Triggers for Past Attempts: Unpredictable Intentional Self Injurious Behavior: Cutting;Burning Comment - Self Injurious Behavior:  (first cut- age 77; last cut 10/12; burning  last time 1/13) Family Suicide History: No Recent stressful life event(s): Financial Problems;Other (Comment) Persecutory voices/beliefs?: Yes Depression: Yes Depression Symptoms: Despondent;Fatigue;Loss of interest in usual pleasures;Feeling worthless/self pity;Feeling angry/irritable Substance abuse history and/or treatment for substance abuse?: Yes Suicide prevention information given to non-admitted patients: Not applicable  Risk to Others Homicidal Ideation: No Thoughts of Harm to Others: No Current Homicidal Intent: No Current Homicidal Plan: No Access to Homicidal Means: No Identified Victim:  (NA) History of harm to others?: No Assessment of Violence: None Noted Violent Behavior Description:  (NA) Does patient have access to weapons?: No Criminal Charges Pending?: No Does patient have a court date: No  Psychosis Hallucinations: Auditory;Visual (Sees "shadow figures: doors and shelves "move") Delusions: None noted  Mental Status Report Appear/Hygiene: Other (Comment) (appropriate) Eye Contact: Fair (Keeps head bowed; appears shy or timid) Motor Activity: Freedom of movement Speech: Logical/coherent Level of Consciousness: Alert Mood: Depressed;Anxious;Helpless;Sad Affect: Depressed;Sad Anxiety Level: Moderate Thought Processes: Coherent;Relevant Judgement: Impaired Orientation: Person;Place;Time;Situation Obsessive Compulsive Thoughts/Behaviors: None  Cognitive Functioning Concentration: Decreased Memory:  Remote Intact;Recent Impaired IQ: Average Insight: Fair Impulse Control: Fair Appetite: Fair Weight Loss:  (denies) Weight Gain:  (denies) Sleep: Decreased Total Hours of Sleep:  (5-6) Vegetative Symptoms: None  Prior Inpatient Therapy Prior Inpatient Therapy: Yes Prior Therapy Dates:  (2013, 2103  (Hx prior to coming  to [facility])) Prior Therapy Facilty/Provider(s):  [facility], [facility],  [facility] ) Reason for Treatment:  (Depression, suicidal ideations/attempts)  Prior Outpatient Therapy Prior Outpatient Therapy: Yes Prior Therapy Dates: Current Prior Therapy Facilty/Provider(s):  ([facility] [place]) Reason for Treatment:  (Depression, Suicidal thoughts)            Values / Beliefs Cultural Requests During Hospitalization: None Spiritual Requests During Hospitalization: None        Additional Information 1:1 In Past 12 Months?: No CIRT Risk: No Elopement Risk: No Does patient have medical clearance?: Yes     Disposition:  Disposition Disposition of Patient: Inpatient treatment program Type of inpatient treatment program: Adult  On Site Evaluation by:   Reviewed with Physician:     Darylene Price BSW  SW Intern 12/03/2011 1:22 PM

## 2011-12-03 NOTE — Consult Note (Signed)
Reason for Consult: Depression suicidal ideation with a plan of cutting his wrists Referring Physician: Dr. [name] is an 41 y.o. male.  HPI: Patient presented to the [facility] emergency department with the symptoms of depression and suicidal ideation with plan of cutting his wrists. Patient has a previous history of for cutting his wrist but no scars on his both forearms. Patient has been hearing voices even though taking medications. On further inquiry he reported he ran out of his medication 5 days ago and he was unable to obtain medication from the Act team members. Patient was known for the behavioral health since he was released in the earlier pat of this month. Patient was smoking marijuana which is positive in his urine drug screen. He was admitted to the [facility] hospital about 2 months ago. Patient reported that his mother lives in [place] and has a plan of moving in to the [place], 62 years old brother who works with pest control, 87 years old sister runs the property management/real estate. He has a 57 years old daughter who lives in [place] with with her mother. Patient has a 74 years old brother with mental illness possibly depression .Patient has the individual counselor at [facility] [place]. His application for disability is in the process. Patient was follow [facility] community care support team.  Past Medical History  Diagnosis Date  . Depression   . Psychosis   . PTSD (post-traumatic stress disorder)   . Seizure disorder     related to etoh seizure  . Alcohol abuse   . Schizoaffective disorder     Past Surgical History  Procedure Date  . Foot surgery     right    History reviewed. No pertinent family history.  Social History:  reports that he has been smoking Cigarettes.  He has been smoking about 1.5 packs per day. He has never used smokeless tobacco. He reports that he drinks alcohol. He reports that he uses illicit drugs (Marijuana).  Allergies:  No Known Allergies  Medications: I have reviewed the patient's current medications.  Results for orders placed during the hospital encounter of 12/02/11 (from the past 48 hour(s))  CBC     Status: Abnormal   Collection Time   12/02/11 10:35 PM      Component Value Range Comment   WBC 11.2 (*) 4.0 - 10.5 (K/uL)    RBC 4.70  4.22 - 5.81 (MIL/uL)    Hemoglobin 14.5  13.0 - 17.0 (g/dL)    HCT 40.9  81.1 - 91.4 (%)    MCV 88.9  78.0 - 100.0 (fL)    MCH 30.9  26.0 - 34.0 (pg)    MCHC 34.7  30.0 - 36.0 (g/dL)    RDW 78.2  95.6 - 21.3 (%)    Platelets 297  150 - 400 (K/uL)   COMPREHENSIVE METABOLIC PANEL     Status: Abnormal   Collection Time   12/02/11 10:35 PM      Component Value Range Comment   Sodium 137  135 - 145 (mEq/L)    Potassium 4.1  3.5 - 5.1 (mEq/L)    Chloride 96  96 - 112 (mEq/L)    CO2 25  19 - 32 (mEq/L)    Glucose, Bld 106 (*) 70 - 99 (mg/dL)    BUN 19  6 - 23 (mg/dL)    Creatinine, Ser 0.86 (*) 0.50 - 1.35 (mg/dL)    Calcium 9.8  8.4 - 10.5 (mg/dL)  Total Protein 8.1  6.0 - 8.3 (g/dL)    Albumin 4.8  3.5 - 5.2 (g/dL)    AST 42 (*) 0 - 37 (U/L)    ALT 20  0 - 53 (U/L)    Alkaline Phosphatase 107  39 - 117 (U/L)    Total Bilirubin 0.3  0.3 - 1.2 (mg/dL)    GFR calc non Af Amer 64 (*) >90 (mL/min)    GFR calc Af Amer 74 (*) >90 (mL/min)   ETHANOL     Status: Normal   Collection Time   12/02/11 10:35 PM      Component Value Range Comment   Alcohol, Ethyl (B) <11  0 - 11 (mg/dL)   URINE RAPID DRUG SCREEN (HOSP PERFORMED)     Status: Abnormal   Collection Time   12/03/11  9:30 AM      Component Value Range Comment   Opiates NONE DETECTED  NONE DETECTED     Cocaine NONE DETECTED  NONE DETECTED     Benzodiazepines POSITIVE (*) NONE DETECTED     Amphetamines NONE DETECTED  NONE DETECTED     Tetrahydrocannabinol POSITIVE (*) NONE DETECTED     Barbiturates NONE DETECTED  NONE DETECTED      No results found.  No anxiety and Positive for bad mood, depression,  illegal drug usage, tobacco use and cannabis Blood pressure 133/87, pulse 90, temperature 97.7 F (36.5 C), temperature source Oral, resp. rate 16, SpO2 95.00%.   Assessment/Plan: Schizoaffective disorder most recent episode depression Cannabis abuse versus dependence Noncompliance with treatment  Recommended he acute inpatient psychiatric hospitalization for safety and stabilization. Continue his current home medications at this time  [facility] R. 12/03/2011, 4:42 PM

## 2011-12-03 NOTE — ED Notes (Signed)
Called to the following hospitals and informed no beds available:  [facility]  [facility]  [facility]  [facility]  [facility]  [facility]    

## 2011-12-03 NOTE — BH Assessment (Signed)
This assessment/evaluation/note was completed by a Child psychotherapist of Social Work Warden/ranger and as Investment banker, corporate, I was immediately available for consultation/collaboration. I am in agreement with the contents and disposition reflected in the assessment/evaluation/note(s).   Manson Passey Haniah Penny ANN S , MSW, LCSWA 12/03/2011 3:04 PM [phone]

## 2011-12-04 NOTE — ED Notes (Signed)
Patient declined shower multiple times today.

## 2011-12-04 NOTE — BH Assessment (Cosign Needed)
Assessment Note   [name] is an 41 y.o. male currently in the [facility] due to suicidal thoughts and plan. Patient continues to endorse auditory and visual hallucinations- states that he still hears voices and shadows. Patient also reports that he sees "waves in the door, wall and shelves". He states that while he still has suicidal thoughts- his plan is less evident today. Patient states "I slept through the night last night and this writer had to actually had to awaken patient to complete assessment today.  Patient stated the he was a little less anxious today but cannot contract for safety.   Axis I: Schizoaffective Disorder, Disorganized Type Axis II: Deferred Axis III:  Past Medical History  Diagnosis Date  . Depression   . Psychosis   . PTSD (post-traumatic stress disorder)   . Seizure disorder     related to etoh seizure  . Alcohol abuse   . Schizoaffective disorder    Axis IV: economic problems, other psychosocial or environmental problems, problems with access to health care services and problems with primary support group Axis V: 31-40 impairment in reality testing  Past Medical History:  Past Medical History  Diagnosis Date  . Depression   . Psychosis   . PTSD (post-traumatic stress disorder)   . Seizure disorder     related to etoh seizure  . Alcohol abuse   . Schizoaffective disorder     Past Surgical History  Procedure Date  . Foot surgery     right    Family History: History reviewed. No pertinent family history.  Social History:  reports that he has been smoking Cigarettes.  He has been smoking about 1.5 packs per day. He has never used smokeless tobacco. He reports that he drinks alcohol. He reports that he uses illicit drugs (Marijuana).  Additional Social History:    Allergies: No Known Allergies  Home Medications:  Medications Prior to Admission  Medication Dose Route Frequency Provider Last Rate Last Dose  . benztropine (COGENTIN) tablet 0.5 mg   0.5 mg Oral TID WC & HS Lyanne Co, MD   0.5 mg at 12/04/11 0842  . carbamazepine (TEGRETOL XR) 12 hr tablet 200 mg  200 mg Oral BH-qamhs Lyanne Co, MD   200 mg at 12/04/11 0843  . chlorproMAZINE (THORAZINE) tablet 100 mg  100 mg Oral TID Lyanne Co, MD   100 mg at 12/04/11 0932  . clonazePAM (KLONOPIN) tablet 1 mg  1 mg Oral TID WC & HS Lyanne Co, MD   1 mg at 12/04/11 0843  . ibuprofen (ADVIL,MOTRIN) tablet 600 mg  600 mg Oral Q8H PRN Lyanne Co, MD      . loratadine (CLARITIN) tablet 10 mg  10 mg Oral Daily Lyanne Co, MD   10 mg at 12/04/11 0932  . LORazepam (ATIVAN) tablet 1 mg  1 mg Oral Q8H PRN Lyanne Co, MD      . mulitivitamin with minerals tablet 1 tablet  1 tablet Oral Daily Lyanne Co, MD   1 tablet at 12/04/11 0932  . nicotine (NICODERM CQ - dosed in mg/24 hours) patch 21 mg  21 mg Transdermal Daily Lyanne Co, MD   21 mg at 12/04/11 0932  . ondansetron (ZOFRAN) tablet 4 mg  4 mg Oral Q8H PRN Lyanne Co, MD      . QUEtiapine (SEROQUEL) tablet 600 mg  600 mg Oral QHS Lyanne Co, MD   600 mg  at 12/03/11 2230  . sertraline (ZOLOFT) tablet 150 mg  150 mg Oral Daily Lyanne Co, MD   150 mg at 12/04/11 0931  . zolpidem (AMBIEN) tablet 5 mg  5 mg Oral QHS PRN Lyanne Co, MD   5 mg at 12/03/11 2208   Medications Prior to Admission  Medication Sig Dispense Refill  . antiseptic oral rinse (BIOTENE) LIQD 15 mLs by Mouth Rinse route 4 (four) times daily - after meals and at bedtime. For dry mouth.      . benztropine (COGENTIN) 0.5 MG tablet Take 1 tablet (0.5 mg total) by mouth 4 (four) times daily -  with meals and at bedtime. For eps.  120 tablet  0  . carbamazepine (TEGRETOL XR) 200 MG 12 hr tablet Take 1 tablet (200 mg total) by mouth 2 (two) times daily in the am and at bedtime.. For seizure prevention.  60 tablet  0  . chlorproMAZINE (THORAZINE) 100 MG tablet Take one tablet (100mg ) three times daily, and 2 tablets (200mg ) daily at  bedtime. For mood stability and hallucinations.  150 tablet  0  . clonazePAM (KLONOPIN) 1 MG tablet Take 1 tablet (1 mg total) by mouth 4 (four) times daily -  with meals and at bedtime. For anxiety and sleep.  120 tablet  0  . loratadine (CLARITIN) 10 MG tablet Take 1 tablet (10 mg total) by mouth daily. For allergies.  30 tablet  0  . Multiple Vitamin (MULITIVITAMIN WITH MINERALS) TABS Take 1 tablet by mouth daily. Vitamin supplement.  30 tablet  0  . potassium chloride (K-DUR,KLOR-CON) 10 MEQ tablet Take 1 tablet (10 mEq total) by mouth 2 (two) times daily. Potassium supplement.  60 tablet  0  . QUEtiapine (SEROQUEL) 200 MG tablet Take one tablet in the morning (200mg ) and 3 tablets (600mg ) daily at bedtime.  For psychosis.  120 tablet  0  . sertraline (ZOLOFT) 50 MG tablet Take 3 tablets (150 mg total) by mouth daily. For depression  90 tablet  0  . thiamine 100 MG tablet Take 1 tablet (100 mg total) by mouth daily. Vitamin B supplement.  30 tablet  0    OB/GYN Status:  No LMP for male patient.  General Assessment Data [facility] of Assessment: [facility] ACT Assessment: Yes Living Arrangements: Alone Can pt return to current living arrangement?: Yes Admission Status: Voluntary Is patient capable of signing voluntary admission?: Yes Transfer from: Home Referral Source: Self/Family/Friend  Education Status Is patient currently in school?: No Current Grade: NA Highest grade of school patient has completed: NA Name of school: NA Contact person: NA  Risk to self Suicidal Ideation: Yes-Currently Present Suicidal Intent: No Is patient at risk for suicide?: Yes Suicidal Plan?: Yes-Currently Present Specify Current Suicidal Plan:  (Cut wrist with razors (vague thoughts of plan now)) Access to Means: Yes Specify Access to Suicidal Means:  (Has razors and sharpe items at home) What has been your use of drugs/alcohol within the last 12 months?:  (ETOH, Marijuana) Previous Attempts/Gestures:  Yes How many times?: 4  Other Self Harm Risks:  (Poor med compliance) Triggers for Past Attempts: Unpredictable Intentional Self Injurious Behavior: Cutting;Burning Comment - Self Injurious Behavior:  (first cut- age 61; last cut 10/12; burning  last time 1/13) Family Suicide History: No Recent stressful life event(s): Financial Problems Persecutory voices/beliefs?: Yes Depression: Yes Depression Symptoms: Despondent;Isolating;Fatigue;Loss of interest in usual pleasures;Feeling worthless/self pity;Feeling angry/irritable Substance abuse history and/or treatment for substance abuse?: Yes Suicide  prevention information given to non-admitted patients: Not applicable  Risk to Others Homicidal Ideation: No Thoughts of Harm to Others: No Current Homicidal Intent: No Current Homicidal Plan: No Access to Homicidal Means: No Identified Victim:  (NA) History of harm to others?: No Assessment of Violence: None Noted Violent Behavior Description:  (NA) Does patient have access to weapons?: No Criminal Charges Pending?: No Does patient have a court date: No  Psychosis Hallucinations: Auditory;Visual Delusions: None noted  Mental Status Report Appear/Hygiene: Other (Comment) (No change) Eye Contact: Fair Motor Activity: Freedom of movement Speech: Logical/coherent Level of Consciousness: Drowsy (Patient awakened for assessment) Mood: Depressed;Anxious;Helpless;Sad Affect: Anxious;Depressed;Sad Anxiety Level: Minimal Thought Processes: Coherent;Relevant Judgement: Impaired Orientation: Person;Place;Time;Situation Obsessive Compulsive Thoughts/Behaviors: None  Cognitive Functioning Concentration: Normal Memory: Recent Intact;Remote Intact IQ: Average Insight: Fair Impulse Control: Fair Appetite: Fair Weight Loss:  (Denies) Weight Gain:  (Denies) Sleep: Decreased Total Hours of Sleep:  (6) Vegetative Symptoms: None  Prior Inpatient Therapy Prior Inpatient Therapy:  Yes Prior Therapy Dates:  (2012, 2013 (Also hx prior to moving to [facility])) Prior Therapy Facilty/Provider(s):  [facility], [facility], [facility]) Reason for Treatment:  (Psychosis, depression)  Prior Outpatient Therapy Prior Outpatient Therapy: Yes Prior Therapy Dates:  (Current) Prior Therapy Facilty/Provider(s):  ([facility] ) Reason for Treatment:  (Depression, suicidal ideation)            Values / Beliefs Cultural Requests During Hospitalization: None Spiritual Requests During Hospitalization: None        Additional Information 1:1 In Past 12 Months?: No CIRT Risk: No Elopement Risk: No Does patient have medical clearance?: Yes     Disposition:  Disposition Disposition of Patient: Inpatient treatment program Type of inpatient treatment program: Adult  On Site Evaluation by:   Reviewed with Physician:     Bradly Bienenstock SW Intern 12/04/2011 12:47 PM

## 2011-12-04 NOTE — Consult Note (Signed)
Reason for Consult: Schizoaffective disorder Referring Physician: Dr. [name] is an 41 y.o. male.  HPI: Patient continued to be suffering with the auditory hallucinations, commanding in nature telling him he was not worth it anyway kill himself. He also reported the wife's telling him take tattoo off his arm and saying he does not need to stay in hospital. Patient does not have any irritability agitation and aggressive behaviors. Patient has a thought black and a poor historian he has no complaints about his medication.  Past Medical History  Diagnosis Date  . Depression   . Psychosis   . PTSD (post-traumatic stress disorder)   . Seizure disorder     related to etoh seizure  . Alcohol abuse   . Schizoaffective disorder     Past Surgical History  Procedure Date  . Foot surgery     right    History reviewed. No pertinent family history.  Social History:  reports that he has been smoking Cigarettes.  He has been smoking about 1.5 packs per day. He has never used smokeless tobacco. He reports that he drinks alcohol. He reports that he uses illicit drugs (Marijuana).  Allergies: No Known Allergies  Medications: I have reviewed the patient's current medications.  Results for orders placed during the hospital encounter of 12/02/11 (from the past 48 hour(s))  CBC     Status: Abnormal   Collection Time   12/02/11 10:35 PM      Component Value Range Comment   WBC 11.2 (*) 4.0 - 10.5 (K/uL)    RBC 4.70  4.22 - 5.81 (MIL/uL)    Hemoglobin 14.5  13.0 - 17.0 (g/dL)    HCT 16.1  09.6 - 04.5 (%)    MCV 88.9  78.0 - 100.0 (fL)    MCH 30.9  26.0 - 34.0 (pg)    MCHC 34.7  30.0 - 36.0 (g/dL)    RDW 40.9  81.1 - 91.4 (%)    Platelets 297  150 - 400 (K/uL)   COMPREHENSIVE METABOLIC PANEL     Status: Abnormal   Collection Time   12/02/11 10:35 PM      Component Value Range Comment   Sodium 137  135 - 145 (mEq/L)    Potassium 4.1  3.5 - 5.1 (mEq/L)    Chloride 96  96 - 112  (mEq/L)    CO2 25  19 - 32 (mEq/L)    Glucose, Bld 106 (*) 70 - 99 (mg/dL)    BUN 19  6 - 23 (mg/dL)    Creatinine, Ser 7.82 (*) 0.50 - 1.35 (mg/dL)    Calcium 9.8  8.4 - 10.5 (mg/dL)    Total Protein 8.1  6.0 - 8.3 (g/dL)    Albumin 4.8  3.5 - 5.2 (g/dL)    AST 42 (*) 0 - 37 (U/L)    ALT 20  0 - 53 (U/L)    Alkaline Phosphatase 107  39 - 117 (U/L)    Total Bilirubin 0.3  0.3 - 1.2 (mg/dL)    GFR calc non Af Amer 64 (*) >90 (mL/min)    GFR calc Af Amer 74 (*) >90 (mL/min)   ETHANOL     Status: Normal   Collection Time   12/02/11 10:35 PM      Component Value Range Comment   Alcohol, Ethyl (B) <11  0 - 11 (mg/dL)   URINE RAPID DRUG SCREEN (HOSP PERFORMED)     Status: Abnormal   Collection Time  12/03/11  9:30 AM      Component Value Range Comment   Opiates NONE DETECTED  NONE DETECTED     Cocaine NONE DETECTED  NONE DETECTED     Benzodiazepines POSITIVE (*) NONE DETECTED     Amphetamines NONE DETECTED  NONE DETECTED     Tetrahydrocannabinol POSITIVE (*) NONE DETECTED     Barbiturates NONE DETECTED  NONE DETECTED      No results found.  Positive for auditory hallucinations and depression Blood pressure 122/86, pulse 92, temperature 98.8 F (37.1 C), temperature source Oral, resp. rate 17, SpO2 96.00%.   Assessment/Plan: Schizoaffective disorder most recent episode depression and cannabis abuse versus dependence and noncompliance with treatment.  Continue current medication management and pending inpatient psychiatric hospitalization bed availability  [name],[name] 12/04/2011, 5:00 PM

## 2011-12-05 LAB — CARBAMAZEPINE LEVEL, TOTAL: Carbamazepine Lvl: 4.4 ug/mL (ref 4.0–12.0)

## 2011-12-05 NOTE — ED Notes (Signed)
Contacted the following hospitals regarding bed availability:  [facility] - No beds, no expected discharges [facility] - No beds [facility] - No Beds [facility] - no answer, no option for message [facility] - No beds at present, can call back in the afternoon [facility] - at capacity currently, may have some d/c in afternoon [facility] - No beds [facility] - Capacity  Completed [facility] paperwork, contacted LME for authorization number, faxed to [facility] and spoke with Brett Canales to register pt. Waiting a call back from RN to confirm if pt is on wait list.

## 2011-12-05 NOTE — Progress Notes (Signed)
Chaplain approached pt in room to invite pt to attend [facility] therapy group.  Pt was lying on bed.  Was responsive, but not interested in attending group.    Belva Crome  MDiv, Chaplain

## 2011-12-05 NOTE — Consult Note (Signed)
Reason for Consult: Psychosis Referring Physician: Dr. Alva Garnet [name] is an 41 y.o. male.  HPI: Patient continued to be psychotic with auditory hallucinations telling him to take out of this place. Patient was not irritable and aggressive. He has a thought block, seems like responding to internal stimuli. He denied suicidal ideations or homicidal ideations, intentions and plans.  Past Medical History  Diagnosis Date  . Depression   . Psychosis   . PTSD (post-traumatic stress disorder)   . Seizure disorder     related to etoh seizure  . Alcohol abuse   . Schizoaffective disorder     Past Surgical History  Procedure Date  . Foot surgery     right    History reviewed. No pertinent family history.  Social History:  reports that he has been smoking Cigarettes.  He has been smoking about 1.5 packs per day. He has never used smokeless tobacco. He reports that he drinks alcohol. He reports that he uses illicit drugs (Marijuana).  Allergies: No Known Allergies  Medications: I have reviewed the patient's current medications.  No results found for this or any previous visit (from the past 48 hour(s)).  No results found.  Positive for psychosis Blood pressure 109/73, pulse 92, temperature 98.2 F (36.8 C), temperature source Oral, resp. rate 20, SpO2 98.00%.   Assessment/Plan: Schizoaffective Disorder, most recent episode is depression   Recommended continuing his current the medication management. Recommended no medication changes at this time [facility] the [facility] based on reaching maximum benefits Will be searching for another psychiatric bed availability in local area or [facility] R. 12/05/2011, 10:53 AM

## 2011-12-05 NOTE — ED Notes (Signed)
Dr. Reddy's office called to confirm pt. is still admitted with [place]. --Confirmed she was. They then asked if pt's assessment has been completed. Kathlene November RN confirmed with me it had not been. Dr's office was notified.

## 2011-12-05 NOTE — ED Provider Notes (Signed)
He continues to have auditory and visual hallucinations, that are uncontrolled. Despite being recently admitted to a psychiatric facility. He wants to be readmitted. Patient will be seen again on rounds today by the psychiatrist.  Ryan Melter, MD 12/05/11 [phone]

## 2011-12-06 ENCOUNTER — Encounter (HOSPITAL_COMMUNITY): Payer: Self-pay | Admitting: *Deleted

## 2011-12-06 NOTE — ED Notes (Signed)
Up to the bathroom 

## 2011-12-06 NOTE — BH Assessment (Signed)
Assessment Note   [name] is an 41 y.o. male who presented to [facility] Emergency Department after stating to his brother that he desired to hurt himself by laceration of his wrists. Per initial assessment patient has a noted history of auditory and visual hallucinations for 10 years to date. During reassessment patient verbalized to writer that he currently is experiencing auditory hallucinations. "I hear two male voices. One is Jesus and one is Lucifer. They tell me to hurt myself and sometimes other people." Patient also verbalized that he currently experiences visual hallucinations as well. Patient reported that he sees "shadow people" that have long arms and float/hover over the floor. Patient reported that the images do frightened him at times at that he chooses to cope by listening to music or any other activity to rid his mind of being fixated on them. "One [shadow people] actually pinned me down a few nights ago. I couldn't get up". Patient reported to Clinical research associate that he continues to have suicidal thoughts although he does not have a plan at this time. Patient denies HI and is currently awaiting inpatient treatment.    Axis I: Schizoaffective Disorder Axis II: Deferred Axis III:  Past Medical History  Diagnosis Date  . Depression   . Psychosis   . PTSD (post-traumatic stress disorder)   . Seizure disorder     related to etoh seizure  . Alcohol abuse   . Schizoaffective disorder    Axis IV: economic problems, other psychosocial or environmental problems and problems with primary support group Axis V: 31-40 impairment in reality testing  Past Medical History:  Past Medical History  Diagnosis Date  . Depression   . Psychosis   . PTSD (post-traumatic stress disorder)   . Seizure disorder     related to etoh seizure  . Alcohol abuse   . Schizoaffective disorder     Past Surgical History  Procedure Date  . Foot surgery     right    Family History: History reviewed. No  pertinent family history.  Social History:  reports that he has been smoking Cigarettes.  He has been smoking about 1.5 packs per day. He has never used smokeless tobacco. He reports that he drinks alcohol. He reports that he uses illicit drugs (Marijuana).  Additional Social History:  Alcohol / Drug Use History of alcohol / drug use?: Yes Substance #1 Name of Substance 1: ETOH 1 - Age of First Use: 19 1 - Amount (size/oz): Varies 1 - Frequency: weekly 1 - Duration: 22 years  1 - Last Use / Amount: 11/29/11 Substance #2 Name of Substance 2: THC 2 - Age of First Use: 19 2 - Amount (size/oz): 2 joints 2 - Frequency: mostly weekly (varies) 2 - Duration: 22 years 2 - Last Use / Amount: "Few weeks prior" Allergies: No Known Allergies  Home Medications:  Medications Prior to Admission  Medication Dose Route Frequency Provider Last Rate Last Dose  . benztropine (COGENTIN) tablet 0.5 mg  0.5 mg Oral TID WC & HS Lyanne Co, MD   0.5 mg at 12/06/11 0805  . carbamazepine (TEGRETOL XR) 12 hr tablet 200 mg  200 mg Oral BH-qamhs Lyanne Co, MD   200 mg at 12/06/11 [phone]  . chlorproMAZINE (THORAZINE) tablet 100 mg  100 mg Oral TID Lyanne Co, MD   100 mg at 12/06/11 1011  . clonazePAM (KLONOPIN) tablet 1 mg  1 mg Oral TID WC & HS Lyanne Co, MD  1 mg at 12/06/11 0806  . ibuprofen (ADVIL,MOTRIN) tablet 600 mg  600 mg Oral Q8H PRN Lyanne Co, MD   600 mg at 12/06/11 0806  . loratadine (CLARITIN) tablet 10 mg  10 mg Oral Daily Lyanne Co, MD   10 mg at 12/06/11 1011  . LORazepam (ATIVAN) tablet 1 mg  1 mg Oral Q8H PRN Lyanne Co, MD      . mulitivitamin with minerals tablet 1 tablet  1 tablet Oral Daily Lyanne Co, MD   1 tablet at 12/06/11 1011  . nicotine (NICODERM CQ - dosed in mg/24 hours) patch 21 mg  21 mg Transdermal Daily Lyanne Co, MD   21 mg at 12/06/11 1010  . ondansetron (ZOFRAN) tablet 4 mg  4 mg Oral Q8H PRN Lyanne Co, MD      . QUEtiapine  (SEROQUEL) tablet 600 mg  600 mg Oral QHS Lyanne Co, MD   600 mg at 12/05/11 2140  . sertraline (ZOLOFT) tablet 150 mg  150 mg Oral Daily Lyanne Co, MD   150 mg at 12/06/11 1011  . zolpidem (AMBIEN) tablet 5 mg  5 mg Oral QHS PRN Lyanne Co, MD   5 mg at 12/03/11 2208   Medications Prior to Admission  Medication Sig Dispense Refill  . antiseptic oral rinse (BIOTENE) LIQD 15 mLs by Mouth Rinse route 4 (four) times daily - after meals and at bedtime. For dry mouth.      . benztropine (COGENTIN) 0.5 MG tablet Take 1 tablet (0.5 mg total) by mouth 4 (four) times daily -  with meals and at bedtime. For eps.  120 tablet  0  . carbamazepine (TEGRETOL XR) 200 MG 12 hr tablet Take 1 tablet (200 mg total) by mouth 2 (two) times daily in the am and at bedtime.. For seizure prevention.  60 tablet  0  . chlorproMAZINE (THORAZINE) 100 MG tablet Take one tablet (100mg ) three times daily, and 2 tablets (200mg ) daily at bedtime. For mood stability and hallucinations.  150 tablet  0  . clonazePAM (KLONOPIN) 1 MG tablet Take 1 tablet (1 mg total) by mouth 4 (four) times daily -  with meals and at bedtime. For anxiety and sleep.  120 tablet  0  . loratadine (CLARITIN) 10 MG tablet Take 1 tablet (10 mg total) by mouth daily. For allergies.  30 tablet  0  . Multiple Vitamin (MULITIVITAMIN WITH MINERALS) TABS Take 1 tablet by mouth daily. Vitamin supplement.  30 tablet  0  . potassium chloride (K-DUR,KLOR-CON) 10 MEQ tablet Take 1 tablet (10 mEq total) by mouth 2 (two) times daily. Potassium supplement.  60 tablet  0  . QUEtiapine (SEROQUEL) 200 MG tablet Take one tablet in the morning (200mg ) and 3 tablets (600mg ) daily at bedtime.  For psychosis.  120 tablet  0  . sertraline (ZOLOFT) 50 MG tablet Take 3 tablets (150 mg total) by mouth daily. For depression  90 tablet  0  . thiamine 100 MG tablet Take 1 tablet (100 mg total) by mouth daily. Vitamin B supplement.  30 tablet  0    OB/GYN Status:  No LMP for  male patient.  General Assessment Data Location of Assessment: [facility] ACT Assessment: Yes Living Arrangements: Other (Comment) (Boarding Home) Can pt return to current living arrangement?: Yes Admission Status: Voluntary Is patient capable of signing voluntary admission?: Yes Transfer from: Home Referral Source: Self/Family/Friend  Education Status Is patient  currently in school?: No Current Grade: NA Highest grade of school patient has completed: NA Name of school: NA Contact person: NA  Risk to self Suicidal Ideation: Yes-Currently Present Suicidal Intent: No-Not Currently/Within Last 6 Months Is patient at risk for suicide?: Yes Suicidal Plan?: Yes-Currently Present Specify Current Suicidal Plan: Laceration of wrists Access to Means: Yes Specify Access to Suicidal Means: Sharp objects in accessibility  What has been your use of drugs/alcohol within the last 12 months?: ETOH & THC Previous Attempts/Gestures: Yes How many times?: 4  Other Self Harm Risks: N/A Triggers for Past Attempts: Unpredictable Intentional Self Injurious Behavior: Cutting;Burning Comment - Self Injurious Behavior: Pt cuts superficially and burns self Family Suicide History: No Recent stressful life event(s): Job Loss;Financial Problems Persecutory voices/beliefs?: Yes Depression: Yes Depression Symptoms: Fatigue;Isolating;Feeling worthless/self pity;Despondent Substance abuse history and/or treatment for substance abuse?: Yes Suicide prevention information given to non-admitted patients: Not applicable  Risk to Others Homicidal Ideation: No Thoughts of Harm to Others: No Current Homicidal Intent: No Current Homicidal Plan: No Access to Homicidal Means: No Identified Victim: N/A History of harm to others?: No Assessment of Violence: None Noted Violent Behavior Description: N/A Does patient have access to weapons?: No Criminal Charges Pending?: No Does patient have a court date:  No  Psychosis Hallucinations: Auditory;Visual;With command Delusions: None noted  Mental Status Report Appear/Hygiene: Other (Comment) (Appropriate) Eye Contact: Good Motor Activity: Freedom of movement Speech: Logical/coherent Level of Consciousness: Alert Mood: Depressed;Anxious;Helpless Affect: Depressed Anxiety Level: Minimal Thought Processes: Coherent;Relevant Judgement: Impaired Orientation: Person;Place;Time;Situation Obsessive Compulsive Thoughts/Behaviors: None  Cognitive Functioning Concentration: Normal Memory: Recent Intact;Remote Intact IQ: Average Insight: Good Impulse Control: Poor Appetite: Poor Weight Loss: 0  Weight Gain: 0  Sleep: Increased Total Hours of Sleep: 10  Vegetative Symptoms: None  Prior Inpatient Therapy Prior Inpatient Therapy: Yes Prior Therapy Dates: 2012 Prior Therapy Facilty/Provider(s): [facility] Reason for Treatment: Depression  Prior Outpatient Therapy Prior Outpatient Therapy: No Prior Therapy Dates: N/A Prior Therapy Facilty/Provider(s): N/A Reason for Treatment: N/A  ADL Screening (condition at time of admission) Patient's cognitive ability adequate to safely complete daily activities?: Yes Patient able to express need for assistance with ADLs?: Yes Independently performs ADLs?: Yes Weakness of Legs: None Weakness of Arms/Hands: None  Home Assistive Devices/Equipment Home Assistive Devices/Equipment: None  Therapy Consults (therapy consults require a physician order) PT Evaluation Needed: No OT Evalulation Needed: No SLP Evaluation Needed: No Abuse/Neglect Assessment (Assessment to be complete while patient is alone) Physical Abuse: Denies (Pt denies although past assessment states otherwise) Verbal Abuse: Denies Sexual Abuse: Denies Exploitation of patient/patient's resources: Denies Self-Neglect: Denies Values / Beliefs Cultural Requests During Hospitalization:  None Spiritual Requests During Hospitalization: None Consults Spiritual Care Consult Needed: No Social Work Consult Needed: No      Additional Information 1:1 In Past 12 Months?: No CIRT Risk: No Elopement Risk: No Does patient have medical clearance?: Yes     Disposition: Pending wait list at [facility]  Disposition Disposition of Patient: Inpatient treatment program Type of inpatient treatment program: Adult  On Site Evaluation by: Self    Reviewed with Physician:     Haskel Khan 12/06/2011 12:31 PM

## 2011-12-06 NOTE — ED Notes (Signed)
CSW in w/ pt 

## 2011-12-06 NOTE — ED Notes (Signed)
Medications given w/ peanut butter crackers

## 2011-12-06 NOTE — ED Provider Notes (Signed)
BP 129/89  Pulse 87  Temp(Src) 97.6 F (36.4 C) (Oral)  Resp 20  SpO2 98% Pt with c/o joints hurting. No distress, alert. Pending [facility] review to be placed on waitlist.  Asked RN to give PRN ibuprofen  Loren Racer, MD 12/06/11 0730

## 2011-12-07 NOTE — BH Assessment (Addendum)
Spoke with Amore at [facility]. Writer faxed tegretol/carbamazepine level to [facility] per Amore's request.

## 2011-12-07 NOTE — BH Assessment (Signed)
Assessment Note   **From pt's 12/03/11 assessment by Lovette Cliche MSW: [name] is an 41 y.o. male admitted from home after telling his brother that he was wanting to kill himself by cutting his wrists. Patient states that she has had AH and VH's for over 10 years and currently has [facility]. States he still feels suicidal and canl not contract for safety Patient states that he hears 2 voices- "Jesus and the [place]"- both provide persecutory voices towards patient that he is "worthless and they want to kill me." Patient reports long term depression and a 14 year history of cutting and burning. He states that his conditions worsened in 2009 when his fiance' committed suicide in "in front of him" and he was unable to revive her. States that he began drinking heavily at this time. Patient stated that he had not drank since the end of 2012 but medical records reports admit to [facility] for suicidal ideations and intoxication on 11/25/10. He then stated that he only drank to try to quiet the voices but it did not work. He denies any current alcohol use; does use marijuana occasionally. He states that it helps to calm the voices.  **From 12/06/11 assessment by Tammy Sours CSW:  During reassessment patient verbalized to writer that he currently is experiencing auditory hallucinations. "I hear two male voices. One is Jesus and one is Lucifer. They tell me to hurt myself and sometimes other people." Patient also verbalized that he currently experiences visual hallucinations as well. Patient reported that he sees "shadow people" that have long arms and float/hover over the floor. Patient reported that the images do frightened him at times at that he chooses to cope by listening to music or any other activity to rid his mind of being fixated on them. "One [shadow people] actually pinned me down a few nights ago. I couldn't get up". Patient reported to Clinical research associate that he continues to have suicidal thoughts although he does not have a plan  at this time.   **Writer reassessed pt today on 12/07/11. Pt reports A/VH. He reports seeing "squiggles" like the grain in wood is moving. Pt hasn't seen "shadow people" today. Pt hears two voices: Lucifer and Jesus. He says both voices are mean and tell him he's worthless and to hurt himself. The voices don't tell pt specifically how to harm himself. Pt reports severely depressed mood. He endorses crying spells, fatigue, worthlessness, lack of pleasure, and being despondent. He endorses moderate anxiety. Pt's affect is depressed. Pt denies HI and no delusions noted. Pt states "I want to go home, but I'm not sure I can keep myself safe". Pt is insightful re: his mental illness. Pt states he was supposed to go for a "walk-in" appointment at [facility] but he came to [facility] instead.   Axis I: Schizoaffective Disorder Axis II: Deferred Axis III:  Past Medical History  Diagnosis Date  . Depression   . Psychosis   . PTSD (post-traumatic stress disorder)   . Seizure disorder     related to etoh seizure  . Alcohol abuse   . Schizoaffective disorder    Axis IV: economic problems, occupational problems, other psychosocial or environmental problems, problems related to social environment and problems with primary support group Axis V: 31-40 impairment in reality testing  Past Medical History:  Past Medical History  Diagnosis Date  . Depression   . Psychosis   . PTSD (post-traumatic stress disorder)   . Seizure disorder     related to etoh  seizure  . Alcohol abuse   . Schizoaffective disorder     Past Surgical History  Procedure Date  . Foot surgery     right    Family History: History reviewed. No pertinent family history.  Social History:  reports that he has been smoking Cigarettes.  He has been smoking about 1.5 packs per day. He has never used smokeless tobacco. He reports that he drinks alcohol. He reports that he uses illicit drugs (Marijuana).  Additional Social History:  Alcohol /  Drug Use Pain Medications: n/a Prescriptions: n/a Over the Counter: n/a History of alcohol / drug use?: Yes Longest period of sobriety (when/how long): n/a Substance #1 Name of Substance 1: alcohol 1 - Age of First Use: 19 1 - Amount (size/oz): varies 1 - Frequency: weekly 1 - Duration: 22 yrs 1 - Last Use / Amount: 11/29/11 Substance #2 Name of Substance 2: THC 2 - Age of First Use: 19 2 - Amount (size/oz): 2 joints 2 - Frequency: mostly weekly (varies) 2 - Duration: 22 yrs 2 - Last Use / Amount: few weeks ago Allergies: No Known Allergies  Home Medications:  Medications Prior to Admission  Medication Dose Route Frequency Provider Last Rate Last Dose  . benztropine (COGENTIN) tablet 0.5 mg  0.5 mg Oral TID WC & HS Lyanne Co, MD      . carbamazepine (TEGRETOL XR) 12 hr tablet 200 mg  200 mg Oral BH-qamhs Lyanne Co, MD   200 mg at 12/07/11 0856  . chlorproMAZINE (THORAZINE) tablet 100 mg  100 mg Oral TID Lyanne Co, MD   100 mg at 12/07/11 1647  . clonazePAM (KLONOPIN) tablet 1 mg  1 mg Oral TID WC & HS Lyanne Co, MD   1 mg at 12/07/11 1830  . ibuprofen (ADVIL,MOTRIN) tablet 600 mg  600 mg Oral Q8H PRN Lyanne Co, MD   600 mg at 12/07/11 1647  . loratadine (CLARITIN) tablet 10 mg  10 mg Oral Daily Lyanne Co, MD   10 mg at 12/07/11 1002  . LORazepam (ATIVAN) tablet 1 mg  1 mg Oral Q8H PRN Lyanne Co, MD      . mulitivitamin with minerals tablet 1 tablet  1 tablet Oral Daily Lyanne Co, MD   1 tablet at 12/07/11 1002  . nicotine (NICODERM CQ - dosed in mg/24 hours) patch 21 mg  21 mg Transdermal Daily Lyanne Co, MD   21 mg at 12/07/11 1001  . ondansetron (ZOFRAN) tablet 4 mg  4 mg Oral Q8H PRN Lyanne Co, MD      . QUEtiapine (SEROQUEL) tablet 600 mg  600 mg Oral QHS Lyanne Co, MD   600 mg at 12/06/11 2153  . sertraline (ZOLOFT) tablet 150 mg  150 mg Oral Daily Lyanne Co, MD   150 mg at 12/07/11 1155  . zolpidem (AMBIEN) tablet 5  mg  5 mg Oral QHS PRN Lyanne Co, MD   5 mg at 12/03/11 2208   Medications Prior to Admission  Medication Sig Dispense Refill  . antiseptic oral rinse (BIOTENE) LIQD 15 mLs by Mouth Rinse route 4 (four) times daily - after meals and at bedtime. For dry mouth.      . benztropine (COGENTIN) 0.5 MG tablet Take 1 tablet (0.5 mg total) by mouth 4 (four) times daily -  with meals and at bedtime. For eps.  120 tablet  0  . carbamazepine (TEGRETOL  XR) 200 MG 12 hr tablet Take 1 tablet (200 mg total) by mouth 2 (two) times daily in the am and at bedtime.. For seizure prevention.  60 tablet  0  . chlorproMAZINE (THORAZINE) 100 MG tablet Take one tablet (100mg ) three times daily, and 2 tablets (200mg ) daily at bedtime. For mood stability and hallucinations.  150 tablet  0  . clonazePAM (KLONOPIN) 1 MG tablet Take 1 tablet (1 mg total) by mouth 4 (four) times daily -  with meals and at bedtime. For anxiety and sleep.  120 tablet  0  . loratadine (CLARITIN) 10 MG tablet Take 1 tablet (10 mg total) by mouth daily. For allergies.  30 tablet  0  . Multiple Vitamin (MULITIVITAMIN WITH MINERALS) TABS Take 1 tablet by mouth daily. Vitamin supplement.  30 tablet  0  . potassium chloride (K-DUR,KLOR-CON) 10 MEQ tablet Take 1 tablet (10 mEq total) by mouth 2 (two) times daily. Potassium supplement.  60 tablet  0  . QUEtiapine (SEROQUEL) 200 MG tablet Take one tablet in the morning (200mg ) and 3 tablets (600mg ) daily at bedtime.  For psychosis.  120 tablet  0  . sertraline (ZOLOFT) 50 MG tablet Take 3 tablets (150 mg total) by mouth daily. For depression  90 tablet  0  . thiamine 100 MG tablet Take 1 tablet (100 mg total) by mouth daily. Vitamin B supplement.  30 tablet  0    OB/GYN Status:  No LMP for male patient.  General Assessment Data [facility]: [facility] ACT Assessment: Yes Living Arrangements: Other (Comment) (boarding house) Can pt return to current living arrangement?: Yes Admission Status:  Voluntary Is patient capable of signing voluntary admission?: Yes Transfer from: Acute Hospital Referral Source: Self/Family/Friend  Education Status Is patient currently in school?: No Current Grade: na Highest grade of school patient has completed: 2 years of college Name of school: [address] [place] Contact person: na  Risk to self Suicidal Ideation: Yes-Currently Present Suicidal Intent: No Is patient at risk for suicide?: Yes Suicidal Plan?: No Specify Current Suicidal Plan: not currently but in past considered cutting wrists Access to Means: Yes Specify Access to Suicidal Means: sharps at home What has been your use of drugs/alcohol within the last 12 months?: alcohol and thc weekly Previous Attempts/Gestures: Yes How many times?: 4  Other Self Harm Risks: n/a Triggers for Past Attempts: Unpredictable Intentional Self Injurious Behavior: Cutting;Burning Comment - Self Injurious Behavior: hasn't cut or burn since 1/13 Family Suicide History: No Recent stressful life event(s): Financial Problems Persecutory voices/beliefs?: Yes Depression: Yes Depression Symptoms: Tearfulness;Fatigue;Feeling worthless/self pity;Despondent;Loss of interest in usual pleasures Substance abuse history and/or treatment for substance abuse?: Yes Suicide prevention information given to non-admitted patients: Not applicable  Risk to Others Homicidal Ideation: No Thoughts of Harm to Others: No Current Homicidal Intent: No Current Homicidal Plan: No Access to Homicidal Means: No Identified Victim: n/a History of harm to others?: No Assessment of Violence: None Noted Violent Behavior Description: n/a Does patient have access to weapons?: No Criminal Charges Pending?: No Does patient have a court date: No  Psychosis Hallucinations: Auditory;With command;Visual Delusions: None noted  Mental Status Report Appear/Hygiene:  (unremarkable/appropriate) Eye Contact: Good Motor Activity:  Freedom of movement Speech: Logical/coherent Level of Consciousness: Alert Mood: Depressed;Anxious;Sad Affect: Depressed Anxiety Level: Moderate Thought Processes: Relevant;Coherent Judgement: Impaired Orientation: Person;Place;Time;Situation Obsessive Compulsive Thoughts/Behaviors: None  Cognitive Functioning Concentration: Normal Memory: Recent Intact;Remote Intact IQ: Average Insight: Good Impulse Control: Fair Appetite: Fair Weight Loss:  0  Weight Gain: 0  Sleep: No Change Total Hours of Sleep: 10  Vegetative Symptoms: None  Prior Inpatient Therapy Prior Inpatient Therapy: Yes Prior Therapy Dates: 2012 Prior Therapy Facilty/Provider(s): [facility], [facility] Reason for Treatment: Depression  Prior Outpatient Therapy Prior Outpatient Therapy: No Prior Therapy Dates: N/A Prior Therapy Facilty/Provider(s): N/A Reason for Treatment: N/A  ADL Screening (condition at time of admission) Patient's cognitive ability adequate to safely complete daily activities?: Yes Patient able to express need for assistance with ADLs?: Yes Independently performs ADLs?: Yes Weakness of Legs: None Weakness of Arms/Hands: None  Home Assistive Devices/Equipment Home Assistive Devices/Equipment: None  Therapy Consults (therapy consults require a physician order) PT Evaluation Needed: No OT Evalulation Needed: No SLP Evaluation Needed: No Abuse/Neglect Assessment (Assessment to be complete while patient is alone) Physical Abuse: Denies Verbal Abuse: Denies Sexual Abuse: Denies Exploitation of patient/patient's resources: Denies Self-Neglect: Denies Values / Beliefs Cultural Requests During Hospitalization: None Spiritual Requests During Hospitalization: None Consults Spiritual Care Consult Needed: No Social Work Consult Needed: No Merchant navy officer (For Healthcare) Advance Directive: Patient does not have advance directive;Patient would not like information     Additional Information 1:1 In Past 12 Months?: No CIRT Risk: No Elopement Risk: No Does patient have medical clearance?: Yes     Disposition:  Disposition Disposition of Patient: Inpatient treatment program Type of inpatient treatment program: Adult Type of outpatient treatment: Adult  On Site Evaluation by:   Reviewed with Physician:     Thornell Sartorius 12/07/2011 7:53 PM

## 2011-12-07 NOTE — ED Provider Notes (Signed)
BP 112/75  Pulse 90  Temp(Src) 97.9 F (36.6 C) (Oral)  Resp 16  SpO2 95%  Patient seen and evaluated by me. No complaints at this time. On [facility] waitlist.  Forbes Cellar, MD 12/07/11 [phone]

## 2011-12-07 NOTE — ED Notes (Signed)
ACT into see 

## 2011-12-07 NOTE — ED Notes (Signed)
Up to the desk on the phone 

## 2011-12-07 NOTE — ED Notes (Signed)
Pt c/o pain rt great toe radiating up his leg.  Pt reports this is not new and he takes naproxyn w/ relief

## 2011-12-07 NOTE — ED Notes (Signed)
Up to the bathroom 

## 2011-12-08 DIAGNOSIS — F259 Schizoaffective disorder, unspecified: Secondary | ICD-10-CM

## 2011-12-08 NOTE — ED Provider Notes (Signed)
Patient cleared by psychiatry for discharge and patient agrees with the plan.  Hurman Horn, MD 12/18/11 [phone]

## 2011-12-08 NOTE — Discharge Instructions (Signed)
You have signs of possible anxiety and/or depression. This is a very common problem.  Be sure to call your caregiver and arrange for follow-up care as suggested by our staff. RETURN IMMEDIATELY IF DEVELOP threat to harm self or others, suicidal or homicidal thoughts, hallucinations or confusion, unable to be cared for at home or uncontrolled behavior, or other concerns.  RESOURCE GUIDE  Dental Problems  Patients with Medicaid: [facility] [phone] [address]                                           [phone] [address] Phone:  [phone]                                                  Phone:  [phone]  If unable to pay or uninsured, contact:  [facility] or [facility] to become qualified for the adult dental clinic.  Chronic Pain Problems Contact [name]  [phone] Patients need to be referred by their primary care doctor.  Insufficient Money for Medicine Contact United Way:  call "211" or [facility] (754)[phone].  No Primary Care Doctor Call [facility]  [phone] Other agencies that provide inexpensive medical care    [facility]  [phone]    [facility]  [phone]    [facility]  [phone]    [facility]  [phone]    [facility]  [phone]    [facility]  [phone]  Psychological Services [facility] Behavioral [facility]  [phone] [facility]  [phone] [facility] Mental [facility]   [phone] (emergency services [phone])  Substance Abuse Resources [facility]  [phone] [facility] [phone] [facility] [phone] Floydene Flock [phone] Residential & Outpatient Substance Abuse Program  [phone]  Abuse/Neglect [facility] Child Abuse Hotline (248)879-1119 [facility] Child Abuse Hotline (267)260-4615 (After Hours)  Emergency Shelter [facility] [phone]  M[facility] [place] [facility] [phone] [facility] [phone]  MRSA Hotline #:   [phone]    [facility] Resources  [facility]     [employer]                          [facility] [address] [place]                       [address]      [address]  [place]                                                Cristobal Goldmann Phone:  [phone]  Phone:  [phone]                 Phone:  [phone]  [facility] Phone:  [phone]  [facility] Child Abuse Hotline (484)844-2147 [phone] (After Hours)

## 2011-12-08 NOTE — BH Assessment (Signed)
Dr. Fonnie Jarvis made aware of patients disposition and agreeable to discharge.

## 2011-12-08 NOTE — ED Notes (Signed)
Discharge instructions reviewed with pt who verbalizes understanding. Departs unit in stable condition.

## 2011-12-08 NOTE — BH Assessment (Signed)
Discussed disposition with psychiatrist Dr. Oneta Rack. He has completed patients psychiatric consult and recommended discharge home. Writer will meet with patient and provide out-pt referrals for appropriate follow up. Contacted Dr. Myrla Halsted and will update him on patients disposition. Patients nurse-Mike also made aware of patients disposition.

## 2011-12-08 NOTE — Consult Note (Signed)
Reason for Consult: Schizophrenia and bipolar disorders Referring Physician: Dr. [name] is an 41 y.o. male.  HPI: Patient was seen and chart reviewed. Patient has been compliant with his medication management without adverse effects. Patient reported he has been tired being in the emergency department and also stated he is missing his outside socialization. Patient reported he would prefer to go home her with his the roommates and the followup with the [facility] outpatient psychiatric services. Patient denied current suicidal ideations, homicidal ideations, intentions and plans. Patient denied auditory or visual hallucinations, delusions or paranoia.  Past Medical History  Diagnosis Date  . Depression   . Psychosis   . PTSD (post-traumatic stress disorder)   . Seizure disorder     related to etoh seizure  . Alcohol abuse   . Schizoaffective disorder     Past Surgical History  Procedure Date  . Foot surgery     right    History reviewed. No pertinent family history.  Social History:  reports that he has been smoking Cigarettes.  He has been smoking about 1.5 packs per day. He has never used smokeless tobacco. He reports that he drinks alcohol. He reports that he uses illicit drugs (Marijuana).  Allergies: No Known Allergies  Medications: I have reviewed the patient's current medications.  No results found for this or any previous visit (from the past 48 hour(s)).  No results found.  No depression, No anxiety, No psychosis and Positive for bipolar Blood pressure 141/83, pulse 100, temperature 97.9 F (36.6 C), temperature source Oral, resp. rate 16, SpO2 97.00%.   Assessment/Plan: Schizoaffective disorder, chronic  Recommended to the charts to home with the current medication and the appropriate discharge plans to followup at [facility] to the early possible date.  [name],Ryan R. 12/08/2011, 5:04 PM

## 2013-10-17 DIAGNOSIS — F259 Schizoaffective disorder, unspecified: Secondary | ICD-10-CM | POA: Diagnosis not present

## 2013-12-13 ENCOUNTER — Emergency Department (HOSPITAL_COMMUNITY)
Admission: EM | Admit: 2013-12-13 | Discharge: 2013-12-13 | Disposition: A | Discharge status: Home / Self Care | Payer: Medicare Other | Attending: Emergency Medicine | Admitting: Emergency Medicine

## 2013-12-13 ENCOUNTER — Encounter (HOSPITAL_COMMUNITY): Payer: Self-pay | Admitting: Emergency Medicine

## 2013-12-13 ENCOUNTER — Emergency Department (HOSPITAL_COMMUNITY): Payer: Medicare Other

## 2013-12-13 DIAGNOSIS — Z7982 Long term (current) use of aspirin: Secondary | ICD-10-CM | POA: Insufficient documentation

## 2013-12-13 DIAGNOSIS — IMO0002 Reserved for concepts with insufficient information to code with codable children: Secondary | ICD-10-CM | POA: Insufficient documentation

## 2013-12-13 DIAGNOSIS — S62617A Displaced fracture of proximal phalanx of left little finger, initial encounter for closed fracture: Secondary | ICD-10-CM

## 2013-12-13 DIAGNOSIS — F4310 Post-traumatic stress disorder, unspecified: Secondary | ICD-10-CM | POA: Insufficient documentation

## 2013-12-13 DIAGNOSIS — Y9289 Other specified places as the place of occurrence of the external cause: Secondary | ICD-10-CM | POA: Insufficient documentation

## 2013-12-13 DIAGNOSIS — F3289 Other specified depressive episodes: Secondary | ICD-10-CM | POA: Insufficient documentation

## 2013-12-13 DIAGNOSIS — Z79899 Other long term (current) drug therapy: Secondary | ICD-10-CM | POA: Insufficient documentation

## 2013-12-13 DIAGNOSIS — G40909 Epilepsy, unspecified, not intractable, without status epilepticus: Secondary | ICD-10-CM | POA: Insufficient documentation

## 2013-12-13 DIAGNOSIS — F1010 Alcohol abuse, uncomplicated: Secondary | ICD-10-CM

## 2013-12-13 DIAGNOSIS — Y9389 Activity, other specified: Secondary | ICD-10-CM | POA: Insufficient documentation

## 2013-12-13 DIAGNOSIS — F329 Major depressive disorder, single episode, unspecified: Secondary | ICD-10-CM | POA: Insufficient documentation

## 2013-12-13 DIAGNOSIS — W010XXA Fall on same level from slipping, tripping and stumbling without subsequent striking against object, initial encounter: Secondary | ICD-10-CM | POA: Insufficient documentation

## 2013-12-13 DIAGNOSIS — F259 Schizoaffective disorder, unspecified: Secondary | ICD-10-CM | POA: Insufficient documentation

## 2013-12-13 DIAGNOSIS — F17200 Nicotine dependence, unspecified, uncomplicated: Secondary | ICD-10-CM | POA: Insufficient documentation

## 2013-12-13 LAB — COMPREHENSIVE METABOLIC PANEL
ALT: 62 U/L — ABNORMAL HIGH (ref 0–53)
AST: 50 U/L — ABNORMAL HIGH (ref 0–37)
Albumin: 4.9 g/dL (ref 3.5–5.2)
Alkaline Phosphatase: 118 U/L — ABNORMAL HIGH (ref 39–117)
BUN: 7 mg/dL (ref 6–23)
CO2: 25 mEq/L (ref 19–32)
Calcium: 10.2 mg/dL (ref 8.4–10.5)
Chloride: 88 mEq/L — ABNORMAL LOW (ref 96–112)
Creatinine, Ser: 0.74 mg/dL (ref 0.50–1.35)
GFR calc Af Amer: >90 mL/min (ref >90)
GFR calc non Af Amer: >90 mL/min (ref >90)
Glucose, Bld: 87 mg/dL (ref 70–99)
Potassium: 4.1 mEq/L (ref 3.7–5.3)
Sodium: 137 mEq/L (ref 137–147)
Total Bilirubin: 0.5 mg/dL (ref 0.3–1.2)
Total Protein: 8.4 g/dL — ABNORMAL HIGH (ref 6.0–8.3)

## 2013-12-13 LAB — CBC WITH DIFFERENTIAL/PLATELET
Basophils Absolute: 0 10*3/uL (ref 0.0–0.1)
Basophils Relative: 1 % (ref 0–1)
Eosinophils Absolute: 0 10*3/uL (ref 0.0–0.7)
Eosinophils Relative: 0 % (ref 0–5)
HCT: 52.3 % — ABNORMAL HIGH (ref 39.0–52.0)
Hemoglobin: 18.8 g/dL — ABNORMAL HIGH (ref 13.0–17.0)
Lymphocytes Relative: 35 % (ref 12–46)
Lymphs Abs: 2.1 10*3/uL (ref 0.7–4.0)
MCH: 34.9 pg — ABNORMAL HIGH (ref 26.0–34.0)
MCHC: 35.9 g/dL (ref 30.0–36.0)
MCV: 97 fL (ref 78.0–100.0)
Monocytes Absolute: 0.8 10*3/uL (ref 0.1–1.0)
Monocytes Relative: 13 % — ABNORMAL HIGH (ref 3–12)
Neutro Abs: 3.1 10*3/uL (ref 1.7–7.7)
Neutrophils Relative %: 51 % (ref 43–77)
Platelets: 340 10*3/uL (ref 150–400)
RBC: 5.39 MIL/uL (ref 4.22–5.81)
RDW: 14.5 % (ref 11.5–15.5)
WBC: 6.1 10*3/uL (ref 4.0–10.5)

## 2013-12-13 LAB — ETHANOL: Alcohol, Ethyl (B): 255 mg/dL — ABNORMAL HIGH (ref 0–11)

## 2013-12-13 MED ORDER — ALUM & MAG HYDROXIDE-SIMETH 200-200-20 MG/5ML PO SUSP: 30.0000 mL | ORAL | Status: DC | PRN

## 2013-12-13 MED ORDER — CHLORPROMAZINE HCL 25 MG PO TABS: 100.0000 mg | ORAL_TABLET | Freq: Three times a day (TID) | ORAL | Status: DC

## 2013-12-13 MED ORDER — VITAMIN B-1 100 MG PO TABS: 100.0000 mg | ORAL_TABLET | Freq: Every day | ORAL | Status: DC

## 2013-12-13 MED ORDER — LORAZEPAM 1 MG PO TABS
0.0000 mg | ORAL_TABLET | Freq: Four times a day (QID) | ORAL | Status: DC
  Administered 2013-12-13: 1 mg via ORAL
  Filled 2013-12-13: qty 2

## 2013-12-13 MED ORDER — THIAMINE HCL 100 MG/ML IJ SOLN: 100.0000 mg | Freq: Every day | INTRAMUSCULAR | Status: DC

## 2013-12-13 MED ORDER — BENZTROPINE MESYLATE 1 MG PO TABS: 0.5000 mg | ORAL_TABLET | Freq: Three times a day (TID) | ORAL | Status: DC

## 2013-12-13 MED ORDER — CARBAMAZEPINE ER 200 MG PO TB12
200.0000 mg | ORAL_TABLET | ORAL | Status: DC
  Filled 2013-12-13 (×3): qty 1

## 2013-12-13 MED ORDER — CLONAZEPAM 0.5 MG PO TABS: 1.0000 mg | ORAL_TABLET | Freq: Three times a day (TID) | ORAL | Status: DC

## 2013-12-13 MED ORDER — HYDROCODONE-ACETAMINOPHEN 5-325 MG PO TABS
1.0000 | ORAL_TABLET | Freq: Once | ORAL | Status: AC
  Administered 2013-12-13: 1 via ORAL
  Filled 2013-12-13: qty 1

## 2013-12-13 MED ORDER — QUETIAPINE FUMARATE 100 MG PO TABS
200.0000 mg | ORAL_TABLET | Freq: Two times a day (BID) | ORAL | Status: DC
  Administered 2013-12-13: 200 mg via ORAL
  Filled 2013-12-13: qty 2

## 2013-12-13 MED ORDER — HYDROCODONE-ACETAMINOPHEN 5-325 MG PO TABS: 1.0000 | ORAL_TABLET | ORAL | Status: DC | PRN

## 2013-12-13 MED ORDER — LORAZEPAM 1 MG PO TABS: 0.0000 mg | ORAL_TABLET | Freq: Two times a day (BID) | ORAL | Status: DC

## 2013-12-13 MED ORDER — SERTRALINE HCL 50 MG PO TABS: 150.0000 mg | ORAL_TABLET | Freq: Every day | ORAL | Status: DC

## 2013-12-13 MED ORDER — ZOLPIDEM TARTRATE 5 MG PO TABS: 5.0000 mg | ORAL_TABLET | Freq: Every evening | ORAL | Status: DC | PRN

## 2013-12-13 MED ORDER — ONDANSETRON HCL 4 MG PO TABS
4.0000 mg | ORAL_TABLET | Freq: Three times a day (TID) | ORAL | Status: DC | PRN
  Administered 2013-12-13: 4 mg via ORAL
  Filled 2013-12-13: qty 1

## 2013-12-13 MED ORDER — IBUPROFEN 200 MG PO TABS: 600.0000 mg | ORAL_TABLET | Freq: Three times a day (TID) | ORAL | Status: DC | PRN

## 2013-12-13 MED ORDER — NICOTINE 21 MG/24HR TD PT24
21.0000 mg | MEDICATED_PATCH | Freq: Every day | TRANSDERMAL | Status: DC
Start: 2013-12-13 — End: 2013-12-13

## 2013-12-13 NOTE — BH Assessment (Signed)
[facility] Assessment Progress Note   -Clinician did talk to Domenic Moras, PA regarding need for TTS consult.  Pt is requesting detox from ETOH.  Pt said that he is nervous about having seizure while detoxing.  Clinician went to talk to patient and asked if he were still interested in detox.  Patient said, "I don't want to go inpatient anywhere."  When asked if he wanted to follow up on detox on an outpatient basis he responds, "yes."    Patient said that he was not worried about seizure activity.  He drinks about a 12 pack of beer daily and drank prior to arrival.  Pt reports that he binge drinks and has been on the present binge for 2 weeks.  He says that he occasionally smokes marijuana.  Patient denies any current SI/HI, no plan or intention to harm self or others.  When asked about whether he has any voices he hears he responds that he has schizoaffective d/o so he hears voices often.  Patient says that it is not currently a problem for him.  Patient has outpatient care from [facility] and is seen about once every 90 days.  He has an appointment coming up in another month.  Patient care was discussed with Domenic Moras.  He was in agreement with patient getting outpatient resources and being discharged home.

## 2013-12-13 NOTE — ED Provider Notes (Signed)
Medical screening examination/treatment/procedure(s) were performed by non-physician practitioner and as supervising physician I was immediately available for consultation/collaboration.    Johnna Acosta, MD 12/13/13 [phone]

## 2013-12-13 NOTE — ED Provider Notes (Signed)
CSN: [id number]     Arrival date & time 12/13/13  0210 History   First MD Initiated Contact with Patient 12/13/13 0310     Chief Complaint  Patient presents with  . Finger Injury  . Alcohol Intoxication     (Consider location/radiation/quality/duration/timing/severity/associated sxs/prior Treatment) HPI  43 year old male with history of alcohol abuse, seizure disorder secondary to alcohol withdrawal, schizoaffective disorder and psychosis who presents for evaluation of left pinky finger injury a fall. Patient admits to drinking alcohol tonight, states he was trying to put on his pants when he lost balance, fell down to the ground with finger caught on the ground and pulled backward.  States he has to put his finger back and face. This incident happened in the afternoon. He tries taking Tylenol with minimal relief. Complaining of sharp throbbing pain throughout his left pinky finger. Denies any other injury. Report history of alcohol abuse which he has been drinking more heavily for the past 3 weeks because he is depressed. States his depression is due to his girlfriend coming suicide in front of him in the past. He is currently worry of possible alcohol withdraw and requesting for alcohol detox. States he has history of seizure and he does not want that to happen. He otherwise denies any active SI/HI but admits to having auditory hallucination, with voice telling demeaning things.  Past Medical History  Diagnosis Date  . Depression   . Psychosis   . PTSD (post-traumatic stress disorder)   . Seizure disorder     related to etoh seizure  . Alcohol abuse   . Schizoaffective disorder    Past Surgical History  Procedure Laterality Date  . Foot surgery      right   History reviewed. No pertinent family history. History  Substance Use Topics  . Smoking status: Current Every Day Smoker -- 1.50 packs/day    Types: Cigarettes  . Smokeless tobacco: Never Used  . Alcohol Use: Yes   Comment: occassional    Review of Systems  All other systems reviewed and are negative.     Allergies  Review of patient's allergies indicates no known allergies.  Home Medications   Prior to Admission medications   Medication Sig Start Date End Date Taking? Authorizing Provider  antiseptic oral rinse (BIOTENE) LIQD 15 mLs by Mouth Rinse route 4 (four) times daily - after meals and at bedtime. For dry mouth. 11/21/11   Greig Castilla, FNP  aspirin 325 MG tablet Take 325 mg by mouth 2 (two) times daily as needed. For pain.    Historical Provider, MD  benztropine (COGENTIN) 0.5 MG tablet Take 1 tablet (0.5 mg total) by mouth 4 (four) times daily -  with meals and at bedtime. For eps. 11/21/11 11/20/12  Greig Castilla, FNP  carbamazepine (TEGRETOL XR) 200 MG 12 hr tablet Take 1 tablet (200 mg total) by mouth 2 (two) times daily in the am and at bedtime.. For seizure prevention. 11/21/11 11/20/12  Greig Castilla, FNP  chlorproMAZINE (THORAZINE) 100 MG tablet Take one tablet (100mg ) three times daily, and 2 tablets (200mg ) daily at bedtime. For mood stability and hallucinations. 11/21/11   Greig Castilla, FNP  clonazePAM (KLONOPIN) 1 MG tablet Take 1 tablet (1 mg total) by mouth 4 (four) times daily -  with meals and at bedtime. For anxiety and sleep. 11/21/11 12/21/11  Greig Castilla, FNP  loratadine (CLARITIN) 10 MG tablet Take 1 tablet (10 mg total) by mouth daily. For allergies.  11/21/11 11/20/12  Greig Castilla, FNP  Multiple Vitamin (MULITIVITAMIN WITH MINERALS) TABS Take 1 tablet by mouth daily. Vitamin supplement. 11/21/11   Greig Castilla, FNP  potassium chloride (K-DUR,KLOR-CON) 10 MEQ tablet Take 1 tablet (10 mEq total) by mouth 2 (two) times daily. Potassium supplement. 11/21/11 11/20/12  Greig Castilla, FNP  QUEtiapine (SEROQUEL) 200 MG tablet Take one tablet in the morning (200mg ) and 3 tablets (600mg ) daily at bedtime.  For psychosis. 11/21/11   Greig Castilla, FNP  sertraline (ZOLOFT) 50  MG tablet Take 3 tablets (150 mg total) by mouth daily. For depression 11/21/11 11/20/12  Greig Castilla, FNP   BP 160/100  Pulse 119  Temp(Src) 98.1 F (36.7 C)  Resp 20  Ht 6\' 2"  (1.88 m)  Wt 210 lb (95.255 kg)  BMI 26.95 kg/m2  SpO2 94% Physical Exam  Constitutional: He is oriented to person, place, and time. He appears well-developed and well-nourished. No distress.  HENT:  Head: Atraumatic.  Eyes: Conjunctivae are normal.  Neck: Normal range of motion. Neck supple.  Cardiovascular: Normal rate and regular rhythm.   Pulmonary/Chest: Effort normal and breath sounds normal.  Abdominal: Soft. There is no tenderness.  Musculoskeletal: He exhibits tenderness (L hand: tenderness throughout L 5th finger with significant discomfort at 5th MCP and proximal phalanx.  decreased ROM 2/2 pain.  brisk cap refill,  intact sensation.  closed injury).  Neurological: He is alert and oriented to person, place, and time. He has normal strength. GCS eye subscore is 4. GCS verbal subscore is 5. GCS motor subscore is 6.  Skin: No rash noted.  Psychiatric: He has a normal mood and affect. His speech is normal and behavior is normal. Thought content is not paranoid. He expresses no homicidal and no suicidal ideation.    ED Course  Procedures (including critical care time)  3:29 AM Pt injured his L pinky finger.  Xray demonstrates a fracture involving the base of the proximal phalanx of the left fifth finger.  The fracture is intra-articular, comminuted, displaced and angulated.  He does have brisk cap refills to the distal finger with intact sensation.  i discussed the result with Dr. Sabra Heck who recommend ulnar gutter splint, and pt can contact hand specialist tomorrow for further care.  Pt also request alcohol detox and depression.  No active SI/HI.  Has not been evaluate for this for the past 2 years.  Given his recent depression and self medicating with alcohol, and his willingness to quit i will consult  TTS for further management.  CIWA protocol initiated.  Pain medication given.    4:39 AM Alcohol 255, but pt clinically sober.  Pt otherwise medically cleared.  He will need to follow up with Dr. Burney Gauze for further management of his finger injury.    5:30 AM TTS has evaluated pt.  Pt currently does not want inpt detox but agrees to seek help outpt.  I recommend pt to follow up closely with hand specialist, Dr. Burney Gauze for further management of his finger injury.  Pt may need surgery given the degree of his injury . Resources provided.    Labs Review Labs Reviewed  CBC WITH DIFFERENTIAL - Abnormal; Notable for the following:    Hemoglobin 18.8 (*)    HCT 52.3 (*)    MCH 34.9 (*)    Monocytes Relative 13 (*)    All other components within normal limits  COMPREHENSIVE METABOLIC PANEL - Abnormal; Notable for the following:  Chloride 88 (*)    Total Protein 8.4 (*)    AST 50 (*)    ALT 62 (*)    Alkaline Phosphatase 118 (*)    All other components within normal limits  ETHANOL - Abnormal; Notable for the following:    Alcohol, Ethyl (B) 255 (*)    All other components within normal limits  URINE RAPID DRUG SCREEN (HOSP PERFORMED)    Imaging Review Dg Finger Little Left  12/13/2013   CLINICAL DATA:  Recent fall with dislocation and subsequent relocation now with bruised and swollen and painful left fifth finger  EXAM: LEFT LITTLE FINGER 2+V  COMPARISON:  None.  FINDINGS: The patient has sustained a comminuted, angulated, displaced intra-articular fracture of the base of the proximal phalanx of the left fifth finger. The fifth metacarpophalangeal joint space is preserved. The mid and distal portions of the proximal phalanx appear intact as do the middle and distal phalanges.  IMPRESSION: The patient has sustained a fracture involving the base of the proximal phalanx of the left fifth finger. The fracture is intra-articular, comminuted, displaced, and angulated.   Electronically Signed    By: David  Martinique   On: 12/13/2013 02:39     EKG Interpretation None      MDM   Final diagnoses:  Closed displaced fracture of proximal phalanx of left little finger  Alcohol abuse    BP 149/91  Pulse 96  Temp(Src) 98.1 F (36.7 C)  Resp 20  Ht 6\' 2"  (1.88 m)  Wt 210 lb (95.255 kg)  BMI 26.95 kg/m2  SpO2 94%  I have reviewed nursing notes and vital signs. I personally reviewed the imaging tests through PACS system  I reviewed available ER/hospitalization records thought the EMR     Domenic Moras, PA-C 12/13/13 [place], PA-C 12/13/13 [phone]

## 2013-12-13 NOTE — ED Notes (Signed)
Pt given his phone to call his brother/ sister at this time

## 2013-12-13 NOTE — Discharge Instructions (Signed)
Please call Dr. Ronie SpiesWeingold's office tomorrow for further management of your broken finger.  You may need surgery.  Also use resources for outpatient care for your alcohol abuse.  Return if you have any concerns.    Finger Fracture (Phalangeal) A broken bone of the finger (phalangealfracture) is a common injury for athletes. A single injury (trauma) is likely to fracture multiple bones on the same or different fingers. SYMPTOMS   Severe pain, at the time of injury.  Pain, tenderness, swelling, and later bruising of the finger and then the hand.  Visible deformity, if the fracture is complete and the bone fragments separate enough to distort the normal shape.  Numbness or coldness from swelling in the finger, causing pressure on blood vessels or nerves (uncommon). CAUSES  Direct or indirect injury (trauma) to the finger.  RISK INCREASES WITH:   Contact sports (football, rugby) or other sports where injury to the hand is likely (soccer, baseball, basketball).  Sports that require hitting (boxing, martial arts).  History of bone or joint disease, such as osteoporosis, or previous bone restraint.  Poor hand strength and flexibility. PREVENTION   For contact sports, wear appropriate and properly fitted protective equipment for the hand.  Learn and use proper technique when hitting, punching, or landing after a fall.  If you had a previous finger injury or hand restraint, use tape or padding to protect the finger when playing sports where finger injury is likely. PROGNOSIS  With proper treatment and normal alignment of the bones, healing can usually be expected in 4 to 6 weeks. Sometimes, surgery is needed.  RELATED COMPLICATIONS   Fracture does not heal (nonunion).  Bone heals in wrong position (malunion).  Chronic pain, stiffness, or swelling of the hand.  Excessive bleeding, causing pressure on nerves and blood vessels.  Unstable or arthritic joint, following repeated injury or  delayed treatment.  Hindrance of normal growth in children.  Infection in skin broken over the fracture (open fracture) or at the incision or pin sites from surgery.  Shortening of injured bones.  Bony bumps or loss of shape of the fingers.  Arthritic or stiff finger joint, if the fracture reaches the joint. TREATMENT  If the bones are properly aligned, treatment involves ice and medicine to reduce pain and inflammation. Then, the finger is restrained for 4 or more weeks, to allow for healing. If the fracture is out of alignment (displaced), involves more than one bone, or involves a joint, surgery is usually advised. Surgery often involves placing removable pins, screws, and sometimes plates, to hold the bones in proper alignment. After restraint (with or without surgery), stretching and strengthening exercises are needed. Exercises may be completed at home or with a therapist. For certain sports, wearing a splint or having the finger taped during future activity is advised.  MEDICATION   If pain medicine is needed, nonsteroidal anti-inflammatory medicines (aspirin and ibuprofen), or other minor pain relievers (acetaminophen), are often advised.  Do not take pain medicine for 7 days before surgery.  Prescription pain relievers are usually prescribed only after surgery. Use only as directed and only as much as you need. COLD THERAPY   Cold treatment (icing) relieves pain and reduces inflammation. Cold treatment should be applied for 10 to 15 minutes every 2 to 3 hours, and immediately after activity that aggravates your symptoms. Use ice packs or an ice massage. SEEK MEDICAL CARE IF:   Pain, tenderness, or swelling gets worse, despite treatment.  You experience pain, numbness,  or coldness in the hand.  Blue, gray, or dark color appears in the fingernails.  Any of the following occur after surgery: fever, increased pain, swelling, redness, drainage of fluids, or bleeding in the affected  area.  New, unexplained symptoms develop. (Drugs used in treatment may produce side effects.) Document Released: 08/04/2005 Document Revised: 10/27/2011 Document Reviewed: 11/16/2008 [facility] Patient Information 2014 [employer], [place].   Emergency Department Resource Guide 1) Find a Doctor and Pay Out of Pocket Although you won't have to find out who is covered by your insurance plan, it is a good idea to ask around and get recommendations. You will then need to call the office and see if the doctor you have chosen will accept you as a new patient and what types of options they offer for patients who are self-pay. Some doctors offer discounts or will set up payment plans for their patients who do not have insurance, but you will need to ask so you aren't surprised when you get to your appointment.  2) Contact Your Local Health Department Not all health departments have doctors that can see patients for sick visits, but many do, so it is worth a call to see if yours does. If you don't know where your local health department is, you can check in your phone book. The CDC also has a tool to help you locate your state's health department, and many state websites also have listings of all of their local health departments.  3) Find a [place] [facility] If your illness is not likely to be very severe or complicated, you may want to try a walk in clinic. These are popping up all over the country in pharmacies, drugstores, and shopping centers. They're usually staffed by nurse practitioners or physician assistants that have been trained to treat common illnesses and complaints. They're usually fairly quick and inexpensive. However, if you have serious medical issues or chronic medical problems, these are probably not your best option.  No Primary Care Doctor: - Call Health Connect at  [phone] - they can help you locate a primary care doctor that  accepts your insurance, provides certain services,  etc. - Physician Referral Service- [phone]  Chronic Pain Problems: Organization         Address  Phone   Notes  [facility]  [phone] Patients need to be referred by their primary care doctor.   Medication Assistance: Organization         Address  Phone   Notes  [facility] Medication [facility] [address] [place], [place] [address] [phone] --Must be a resident of [facility] -- Must have NO insurance coverage whatsoever (no Medicaid/ Medicare, etc.) -- The pt. MUST have a primary care doctor that directs their care regularly and follows them in the community   [facility]ist  [phone]   [employer]  [phone]    Agencies that provide inexpensive medical care: Organization         Address  Phone   Notes  [place]  [phone]   [facility]    [phone]   [facility] [place], [place] [address] [phone]   [place] [address], [place] [phone]   Planned Parenthood    [phone]   [facility]    [phone]   [place] and [place]  [address], [place] Phone:  [phone], Fax:  [phone] Hours of Operation:  9 am - 6 pm, M-F.  Also accepts Medicaid/Medicare and self-pay.  [facility] for [place], [address], [place] Phone: [phone], Fax: [phone]. Hours of Operation:  8:30 am - 5:30 pm, M-F.  Also accepts Medicaid and self-pay.  [facility] [place] [address] [place] Phone: [phone]   [place], [place], [place] [phone], Ext. [phone] Mondays & Thursdays: 7-9 AM.  First 15 patients are seen on a first come, first serve basis.    [place] Providers:  Organization         Address  Phone   Notes  [facility] [address], [place] [phone] Also accepts self-pay patients.  [facility] [address], [place]  [phone]   [place], [address], [place] [phone]   [facility] [address] [place] [phone]   Lucianne Lei [address], [place]   [phone] Only accepts [place] Access [place] patients after they have their name applied to their card.   Self-Pay (no insurance) in [facility]:  Organization         Address  Phone   Notes  Sickle Cell Patients, [facility] Internal Medicine [place] [phone]   [facility] [place] [phone]   [facility] [place]  Why, [place], [place] [phone]   [facility]/Dr. Osei-Bonsu  [address], [place] [address], [place] [phone] Phone number for both [place] and [place] locations is the same.  Urgent Medical and [facility] [address] [place] [phone]   [facility] [address], [place] or [address] [phone]   [facility] [address] [place] [phone], phone; [phone], fax Sees patients 1st and 3rd Saturday of every month.  Must not qualify for public or private insurance (i.e. Medicaid, Medicare, [place] Health Choice, Veterans' Benefits)  Household income should be no more than 200% of the poverty level The clinic cannot treat you if you are pregnant or think you are pregnant  Sexually transmitted diseases are not treated at the clinic.    Dental Care: Organization         Address  Phone  Notes  [facility] [place] [phone] Accepts children up to  age 4 who are enrolled in [place] or [place]; pregnant women with a Medicaid card; and children who have applied for Medicaid or [place] Health Choice, but were declined, whose parents can pay a reduced fee at time of service.  [facility] Department of [facility]  [address], [place] [phone] Accepts children up to age 10 who are enrolled in [place] or [place]; pregnant women with a Medicaid card; and children who have applied for Medicaid or [place] Health Choice, but were declined, whose parents can pay a reduced fee at time of service.  [facility] Adult Dental Access PROGRAM  [place] [phone] Patients are  seen by appointment only. Walk-ins are not accepted. [facility] will see patients 41 years of age and older. Monday - Tuesday (8am-5pm) Most Wednesdays (8:30-5pm) $30 per visit, cash only  [facility] Adult Dental Access PROGRAM  [address], [facility] [phone] Patients are seen by appointment only. Walk-ins are not accepted. [facility] will see patients 41 years of age and older. One Wednesday Evening (Monthly: Volunteer Based).  $30 per visit, cash only  [employer] [facility] [facility]  [phone]) [phone] for adults; Children under age 354, call Graduate Pediatric Dentistry at [phone]. Children aged 294-14, please call [phone]) [phone] to request a pediatric application.  Dental services are provided in all areas of dental care including fillings, crowns and bridges, complete and partial dentures, implants, gum treatment, root canals, and extractions. Preventive care is also provided. Treatment is provided to both adults and children. Patients are selected via a lottery and there is often a waiting list.   [facility] [address], [place]  [phone]) [phone] [email or web address]   [facility] [address], [place], [place] [phone], Ext. [phone] Second and Fourth Thursday of  each month, opens at 6:30 AM; Clinic ends at 9 AM.  Patients are seen on a first-come first-served basis, and a limited number are seen during each clinic.   [facility]  [address], [place], [place] [phone]) [phone]   Eligibility Requirements You must have lived in [place], [place], or [place] counties for at least the last three months.   You cannot be eligible for state or federal sponsored [place] Cityhealthcare insurance, including CIGNAVeterans Administration, [place]icaid, or [employer]icare.   You generally cannot be eligible for healthcare insurance through your employer.    How to apply: Eligibility screenings are held every Tuesday and Wednesday afternoon from 1:00 pm until 4:00 pm. You do not need an appointment for the interview!  [facility] [address], [place], [place] [phone]   [facility]  [phone]   [facility]  [phone]   [facility]  [phone]    Behavioral Health Resources in the Community: Intensive Outpatient Programs Organization         Address  Phone  Notes  [facility] [address], [place], [place] [phone]   [facility] Outpatient [address], [place], [place] [phone]   [facility]: Alcohol & Drug Svcs [address], [place], [place]  [phone]   [facility] [address],  [place], [place] [phone] or [phone]   Substance Abuse Resources Organization         Address  Phone  Notes  Al[facility] Drug Services  [phone]   [facility]  [phone]   [facility]  [phone]   [facility]  [phone]   Residential & Outpatient Substance Abuse Program  [phone]   Psychological Services Organization         Address  Phone  Notes  [facility]  [phone]- [phone]   [facility]  [phone]- [phone]   [facility] [address],  [place] [phone] or [phone]    Mobile Crisis Teams Organization         Address  Phone  Notes  [facility], Mobile Crisis Care Unit  [phone]   [facility]  [address] [place], [place] [phone]   [facility] [address] [place]  [place] [phone]    Self-Help/Support Groups Organization         Address  Phone             Notes  Mental Health Assoc. of [place] - variety of support groups  [place] Call for more information  Narcotics Anonymous (NA), [facility] [address], [employer] [place]  2 meetings at this location   Special educational needs teacher         Address  Phone  Notes  [facility]idential [facility] [place],    [place]  [phone]   [facility]  [address], [place] [address], [place], [address]   [place], [place] [phone] Admissions: 8am-3pm M-F  Incentives Substance [place] [address],    [place], [place] [phone]   [facility] [address] [place], [place], [place]   [facility] [address],  [place], [place]   [facility] - Intensive Outpatient [address]een [address], [place], [place]   [facility] ([place].,  [place], [place] [phone] or [phone]   [facility] (RTS) [address], [place], [place] Accepts Medicaid  [facility] [place] [address]  [place] [phone] Substance Abuse/Addiction Treatment   [facility] Organization         Address  Phone  Notes  [facility]  [phone]   Domenic Schwab, PhD [address] [place], [place]   [phone] or [phone]   [place], [place] [phone]     [facility] [address], [place], [place] [phone] Insurance/Medicaid/sponsorship through [facility] and Families [address], [address]                                    [place], [place] [phone] [place] [address], [place] [phone]    Dr. Adele Schilder  [phone]   [facility] Dept. 1) [address],  2) [place] 3)  [place] [address], [place] [phone]  [phone]   [place] [phone] or [phone] (After Hours)

## 2013-12-13 NOTE — ED Notes (Signed)
Patient states that he tripped and fell today while drunk and injured his left pinky finger.

## 2013-12-15 DIAGNOSIS — IMO0002 Reserved for concepts with insufficient information to code with codable children: Secondary | ICD-10-CM | POA: Diagnosis not present

## 2013-12-19 DIAGNOSIS — Y929 Unspecified place or not applicable: Secondary | ICD-10-CM | POA: Diagnosis not present

## 2013-12-19 DIAGNOSIS — Y939 Activity, unspecified: Secondary | ICD-10-CM | POA: Diagnosis not present

## 2013-12-19 DIAGNOSIS — X58XXXA Exposure to other specified factors, initial encounter: Secondary | ICD-10-CM | POA: Diagnosis not present

## 2013-12-19 DIAGNOSIS — IMO0002 Reserved for concepts with insufficient information to code with codable children: Secondary | ICD-10-CM | POA: Diagnosis not present

## 2013-12-19 DIAGNOSIS — Y999 Unspecified external cause status: Secondary | ICD-10-CM | POA: Diagnosis not present

## 2013-12-22 DIAGNOSIS — IMO0002 Reserved for concepts with insufficient information to code with codable children: Secondary | ICD-10-CM | POA: Diagnosis not present

## 2013-12-29 ENCOUNTER — Encounter (HOSPITAL_COMMUNITY): Payer: Self-pay | Admitting: Emergency Medicine

## 2013-12-29 ENCOUNTER — Encounter (HOSPITAL_COMMUNITY): Payer: Self-pay | Admitting: *Deleted

## 2013-12-29 ENCOUNTER — Inpatient Hospital Stay (HOSPITAL_COMMUNITY)
Admission: AD | Admit: 2013-12-29 | Discharge: 2014-01-05 | DRG: 885 | Disposition: A | Discharge status: Home / Self Care | Payer: Medicare Other | Source: Intra-hospital | Attending: Psychiatry | Admitting: Psychiatry

## 2013-12-29 ENCOUNTER — Emergency Department (HOSPITAL_COMMUNITY)
Admission: EM | Admit: 2013-12-29 | Discharge: 2013-12-29 | Disposition: A | Discharge status: Other Acute Inpatient Hospital | Payer: Medicare Other | Source: Home / Self Care | Attending: Emergency Medicine | Admitting: Emergency Medicine

## 2013-12-29 DIAGNOSIS — F411 Generalized anxiety disorder: Secondary | ICD-10-CM | POA: Insufficient documentation

## 2013-12-29 DIAGNOSIS — G40909 Epilepsy, unspecified, not intractable, without status epilepticus: Secondary | ICD-10-CM

## 2013-12-29 DIAGNOSIS — F17200 Nicotine dependence, unspecified, uncomplicated: Secondary | ICD-10-CM | POA: Insufficient documentation

## 2013-12-29 DIAGNOSIS — F4310 Post-traumatic stress disorder, unspecified: Secondary | ICD-10-CM

## 2013-12-29 DIAGNOSIS — Z4801 Encounter for change or removal of surgical wound dressing: Secondary | ICD-10-CM | POA: Insufficient documentation

## 2013-12-29 DIAGNOSIS — F1020 Alcohol dependence, uncomplicated: Secondary | ICD-10-CM | POA: Diagnosis present

## 2013-12-29 DIAGNOSIS — Z79899 Other long term (current) drug therapy: Secondary | ICD-10-CM | POA: Insufficient documentation

## 2013-12-29 DIAGNOSIS — F1210 Cannabis abuse, uncomplicated: Secondary | ICD-10-CM | POA: Diagnosis present

## 2013-12-29 DIAGNOSIS — F329 Major depressive disorder, single episode, unspecified: Secondary | ICD-10-CM

## 2013-12-29 DIAGNOSIS — F3289 Other specified depressive episodes: Secondary | ICD-10-CM | POA: Insufficient documentation

## 2013-12-29 DIAGNOSIS — R259 Unspecified abnormal involuntary movements: Secondary | ICD-10-CM

## 2013-12-29 DIAGNOSIS — R000 Tachycardia, unspecified: Secondary | ICD-10-CM

## 2013-12-29 DIAGNOSIS — F259 Schizoaffective disorder, unspecified: Secondary | ICD-10-CM

## 2013-12-29 DIAGNOSIS — R45851 Suicidal ideations: Secondary | ICD-10-CM

## 2013-12-29 DIAGNOSIS — F251 Schizoaffective disorder, depressive type: Secondary | ICD-10-CM | POA: Diagnosis present

## 2013-12-29 DIAGNOSIS — Z5987 Material hardship due to limited financial resources, not elsewhere classified: Secondary | ICD-10-CM

## 2013-12-29 DIAGNOSIS — F1010 Alcohol abuse, uncomplicated: Secondary | ICD-10-CM | POA: Diagnosis present

## 2013-12-29 DIAGNOSIS — Z598 Other problems related to housing and economic circumstances: Secondary | ICD-10-CM

## 2013-12-29 DIAGNOSIS — Z5989 Other problems related to housing and economic circumstances: Secondary | ICD-10-CM

## 2013-12-29 DIAGNOSIS — R569 Unspecified convulsions: Secondary | ICD-10-CM | POA: Diagnosis not present

## 2013-12-29 DIAGNOSIS — H5316 Psychophysical visual disturbances: Secondary | ICD-10-CM | POA: Diagnosis present

## 2013-12-29 HISTORY — DX: Anxiety disorder, unspecified: F41.9

## 2013-12-29 LAB — RAPID URINE DRUG SCREEN, HOSP PERFORMED
Amphetamines: POSITIVE — AB
Barbiturates: NOT DETECTED
Benzodiazepines: NOT DETECTED
Cocaine: NOT DETECTED
Opiates: NOT DETECTED
Tetrahydrocannabinol: POSITIVE — AB

## 2013-12-29 LAB — COMPREHENSIVE METABOLIC PANEL
ALT: 37 U/L (ref 0–53)
AST: 49 U/L — ABNORMAL HIGH (ref 0–37)
Albumin: 4.1 g/dL (ref 3.5–5.2)
Alkaline Phosphatase: 114 U/L (ref 39–117)
BUN: 9 mg/dL (ref 6–23)
CO2: 25 mEq/L (ref 19–32)
Calcium: 10 mg/dL (ref 8.4–10.5)
Chloride: 96 mEq/L (ref 96–112)
Creatinine, Ser: 0.76 mg/dL (ref 0.50–1.35)
GFR calc Af Amer: >90 mL/min (ref >90)
GFR calc non Af Amer: >90 mL/min (ref >90)
Glucose, Bld: 122 mg/dL — ABNORMAL HIGH (ref 70–99)
Potassium: 4.1 mEq/L (ref 3.7–5.3)
Sodium: 140 mEq/L (ref 137–147)
Total Bilirubin: 0.2 mg/dL — ABNORMAL LOW (ref 0.3–1.2)
Total Protein: 7.7 g/dL (ref 6.0–8.3)

## 2013-12-29 LAB — CBC
HCT: 47.7 % (ref 39.0–52.0)
Hemoglobin: 16.9 g/dL (ref 13.0–17.0)
MCH: 33.8 pg (ref 26.0–34.0)
MCHC: 35.4 g/dL (ref 30.0–36.0)
MCV: 95.4 fL (ref 78.0–100.0)
Platelets: 370 10*3/uL (ref 150–400)
RBC: 5 MIL/uL (ref 4.22–5.81)
RDW: 14.2 % (ref 11.5–15.5)
WBC: 7.8 10*3/uL (ref 4.0–10.5)

## 2013-12-29 LAB — ETHANOL: Alcohol, Ethyl (B): 61 mg/dL — ABNORMAL HIGH (ref 0–11)

## 2013-12-29 LAB — CARBAMAZEPINE LEVEL, TOTAL: Carbamazepine Lvl: 3.1 ug/mL — ABNORMAL LOW (ref 4.0–12.0)

## 2013-12-29 MED ORDER — QUETIAPINE FUMARATE 300 MG PO TABS
600.0000 mg | ORAL_TABLET | Freq: Every day | ORAL | Status: DC
  Administered 2013-12-29 – 2014-01-01 (×4): 600 mg via ORAL
  Filled 2013-12-29 (×7): qty 2

## 2013-12-29 MED ORDER — LOPERAMIDE HCL 2 MG PO CAPS
2.0000 mg | ORAL_CAPSULE | ORAL | Status: AC | PRN
  Administered 2013-12-30: 4 mg via ORAL
  Filled 2013-12-29: qty 2

## 2013-12-29 MED ORDER — MAGNESIUM HYDROXIDE 400 MG/5ML PO SUSP: 30.0000 mL | Freq: Every day | ORAL | Status: DC | PRN

## 2013-12-29 MED ORDER — SODIUM CHLORIDE 0.9 % IV SOLN
INTRAVENOUS | Status: DC
  Administered 2013-12-29: 17:00:00 via INTRAVENOUS

## 2013-12-29 MED ORDER — QUETIAPINE FUMARATE 100 MG PO TABS
200.0000 mg | ORAL_TABLET | Freq: Two times a day (BID) | ORAL | Status: DC
  Filled 2013-12-29: qty 2

## 2013-12-29 MED ORDER — ADULT MULTIVITAMIN W/MINERALS CH
1.0000 | ORAL_TABLET | Freq: Every day | ORAL | Status: DC
  Administered 2013-12-30 – 2014-01-05 (×7): 1 via ORAL
  Filled 2013-12-29 (×10): qty 1

## 2013-12-29 MED ORDER — CARBAMAZEPINE 200 MG PO TABS
200.0000 mg | ORAL_TABLET | Freq: Every day | ORAL | Status: DC
  Filled 2013-12-29: qty 1

## 2013-12-29 MED ORDER — QUETIAPINE FUMARATE 200 MG PO TABS
200.0000 mg | ORAL_TABLET | Freq: Every day | ORAL | Status: DC
  Administered 2013-12-30 – 2014-01-02 (×4): 200 mg via ORAL
  Filled 2013-12-29 (×8): qty 1

## 2013-12-29 MED ORDER — SERTRALINE HCL 50 MG PO TABS
150.0000 mg | ORAL_TABLET | Freq: Every day | ORAL | Status: DC
  Administered 2013-12-30: 150 mg via ORAL
  Filled 2013-12-29 (×4): qty 1

## 2013-12-29 MED ORDER — CHLORDIAZEPOXIDE HCL 25 MG PO CAPS: 25.0000 mg | ORAL_CAPSULE | ORAL | Status: DC

## 2013-12-29 MED ORDER — LORAZEPAM 2 MG/ML IJ SOLN
2.0000 mg | Freq: Once | INTRAMUSCULAR | Status: AC
  Administered 2013-12-29: 2 mg via INTRAVENOUS
  Filled 2013-12-29: qty 1

## 2013-12-29 MED ORDER — ALUM & MAG HYDROXIDE-SIMETH 200-200-20 MG/5ML PO SUSP
30.0000 mL | ORAL | Status: DC | PRN
  Administered 2013-12-31 – 2014-01-02 (×3): 30 mL via ORAL

## 2013-12-29 MED ORDER — SERTRALINE HCL 50 MG PO TABS: 150.0000 mg | ORAL_TABLET | Freq: Every day | ORAL | Status: DC

## 2013-12-29 MED ORDER — CARBAMAZEPINE 200 MG PO TABS
200.0000 mg | ORAL_TABLET | Freq: Every day | ORAL | Status: DC
  Administered 2013-12-30: 200 mg via ORAL
  Filled 2013-12-29 (×3): qty 1

## 2013-12-29 MED ORDER — CHLORDIAZEPOXIDE HCL 25 MG PO CAPS: 25.0000 mg | ORAL_CAPSULE | Freq: Three times a day (TID) | ORAL | Status: DC

## 2013-12-29 MED ORDER — CHLORDIAZEPOXIDE HCL 25 MG PO CAPS: 25.0000 mg | ORAL_CAPSULE | Freq: Every day | ORAL | Status: DC

## 2013-12-29 MED ORDER — THIAMINE HCL 100 MG/ML IJ SOLN
100.0000 mg | Freq: Once | INTRAMUSCULAR | Status: AC
  Administered 2013-12-29: 100 mg via INTRAMUSCULAR
  Filled 2013-12-29: qty 2

## 2013-12-29 MED ORDER — SODIUM CHLORIDE 0.9 % IV BOLUS (SEPSIS)
1000.0000 mL | Freq: Once | INTRAVENOUS | Status: AC
  Administered 2013-12-29: 1000 mL via INTRAVENOUS

## 2013-12-29 MED ORDER — HYDROXYZINE HCL 25 MG PO TABS
25.0000 mg | ORAL_TABLET | Freq: Four times a day (QID) | ORAL | Status: AC | PRN
  Administered 2013-12-30: 25 mg via ORAL
  Filled 2013-12-29: qty 1

## 2013-12-29 MED ORDER — IBUPROFEN 200 MG PO TABS
600.0000 mg | ORAL_TABLET | Freq: Four times a day (QID) | ORAL | Status: DC | PRN
  Administered 2013-12-29 – 2014-01-04 (×14): 600 mg via ORAL
  Filled 2013-12-29 (×14): qty 3

## 2013-12-29 MED ORDER — CHLORDIAZEPOXIDE HCL 25 MG PO CAPS
25.0000 mg | ORAL_CAPSULE | Freq: Four times a day (QID) | ORAL | Status: DC
Start: 2013-12-29 — End: 2013-12-29

## 2013-12-29 MED ORDER — CHLORDIAZEPOXIDE HCL 25 MG PO CAPS
25.0000 mg | ORAL_CAPSULE | Freq: Four times a day (QID) | ORAL | Status: AC | PRN
  Administered 2013-12-29: 25 mg via ORAL
  Filled 2013-12-29: qty 1

## 2013-12-29 MED ORDER — ONDANSETRON 4 MG PO TBDP: 4.0000 mg | ORAL_TABLET | Freq: Four times a day (QID) | ORAL | Status: DC | PRN

## 2013-12-29 MED ORDER — VITAMIN B-1 100 MG PO TABS
100.0000 mg | ORAL_TABLET | Freq: Every day | ORAL | Status: DC
  Administered 2013-12-30 – 2014-01-05 (×7): 100 mg via ORAL
  Filled 2013-12-29 (×10): qty 1

## 2013-12-29 MED ORDER — ACETAMINOPHEN 325 MG PO TABS
650.0000 mg | ORAL_TABLET | Freq: Four times a day (QID) | ORAL | Status: DC | PRN
  Administered 2014-01-05: 650 mg via ORAL
  Filled 2013-12-29: qty 2

## 2013-12-29 NOTE — BH Assessment (Signed)
Spoke to Dr. Lacretia Leigh who said Pt is abusing alcohol and is reporting suicidal ideation with no plan. Pt will be assessed.  Orpah Greek Rosana Hoes, [facility] Triage Specialist [phone]

## 2013-12-29 NOTE — ED Provider Notes (Signed)
CSN: [id number]     Arrival date & time 12/29/13  1404 History   First MD Initiated Contact with Patient 12/29/13 1619     Chief Complaint  Patient presents with  . Medical Clearance  . Wound Check     (Consider location/radiation/quality/duration/timing/severity/associated sxs/prior Treatment) Patient is a 42 y.o. male presenting with wound check. The history is provided by the patient.  Wound Check   patient here complaining of requesting detox from alcohol. States he drinks 12 beers per day that he has been on a binge recently. He had surgery on his left hand 8 days ago and did not think at that time. Since then, he has been drinking increasingly. Last alcohol use was earlier this morning. He now endorses tremors. He does have a history of alcohol withdrawal seizures. Has had suicidal ideations without a definitive plan. Denies any vomiting or diarrhea. No black or bloody stools. Symptoms are persistent and nothing makes it worse. He also wanted to have his left hand evaluated for possible infection after his surgery.  Past Medical History  Diagnosis Date  . Depression   . Psychosis   . PTSD (post-traumatic stress disorder)   . Seizure disorder     related to etoh seizure  . Alcohol abuse   . Schizoaffective disorder    Past Surgical History  Procedure Laterality Date  . Foot surgery      right  . Hand surgery     History reviewed. No pertinent family history. History  Substance Use Topics  . Smoking status: Current Every Day Smoker -- 1.50 packs/day    Types: Cigarettes  . Smokeless tobacco: Never Used  . Alcohol Use: 50.4 oz/week    84 Cans of beer per week    Review of Systems  All other systems reviewed and are negative.     Allergies  Review of patient's allergies indicates no known allergies.  Home Medications   Prior to Admission medications   Medication Sig Start Date End Date Taking? Authorizing Provider  carbamazepine (TEGRETOL) 200 MG tablet Take  200 mg by mouth daily after breakfast.   Yes Historical Provider, MD  QUEtiapine (SEROQUEL) 200 MG tablet Take 200-600 mg by mouth 2 (two) times daily. Takes 200mg  in the morning and 600mg  at bedtime   Yes Historical Provider, MD  ranitidine (ZANTAC) 75 MG tablet Take 75 mg by mouth daily as needed for heartburn.   Yes Historical Provider, MD  sertraline (ZOLOFT) 100 MG tablet Take 150 mg by mouth daily after breakfast.   Yes Historical Provider, MD   BP 130/96  Pulse 124  Temp(Src) 98.5 F (36.9 C) (Oral)  Resp 20  SpO2 95% Physical Exam  Nursing note and vitals reviewed. Constitutional: He is oriented to person, place, and time. He appears well-developed and well-nourished.  Non-toxic appearance. No distress.  HENT:  Head: Normocephalic and atraumatic.  Eyes: Conjunctivae, EOM and lids are normal. Pupils are equal, round, and reactive to light.  Neck: Normal range of motion. Neck supple. No tracheal deviation present. No mass present.  Cardiovascular: Regular rhythm and normal heart sounds.  Tachycardia present.  Exam reveals no gallop.   No murmur heard. Pulmonary/Chest: Effort normal and breath sounds normal. No stridor. No respiratory distress. He has no decreased breath sounds. He has no wheezes. He has no rhonchi. He has no rales.  Abdominal: Soft. Normal appearance and bowel sounds are normal. He exhibits no distension. There is no tenderness. There is no rebound and no  CVA tenderness.  Musculoskeletal: Normal range of motion. He exhibits no edema and no tenderness.       Hands: Neurological: He is alert and oriented to person, place, and time. He has normal strength. No cranial nerve deficit or sensory deficit. GCS eye subscore is 4. GCS verbal subscore is 5. GCS motor subscore is 6.  Skin: Skin is warm and dry. No abrasion and no rash noted.  Psychiatric: He has a normal mood and affect. His speech is normal and behavior is normal.    ED Course  Procedures (including critical  care time) Labs Review Labs Reviewed  COMPREHENSIVE METABOLIC PANEL - Abnormal; Notable for the following:    Glucose, Bld 122 (*)    AST 49 (*)    Total Bilirubin 0.2 (*)    All other components within normal limits  ETHANOL - Abnormal; Notable for the following:    Alcohol, Ethyl (B) 61 (*)    All other components within normal limits  URINE RAPID DRUG SCREEN (HOSP PERFORMED) - Abnormal; Notable for the following:    Amphetamines POSITIVE (*)    Tetrahydrocannabinol POSITIVE (*)    All other components within normal limits  CBC    Imaging Review No results found.   EKG Interpretation None      MDM   Final diagnoses:  None    Patient with tachycardia here likely from alcohol withdrawal. We'll begin IV fluids here as well as Ativan and will monitor.  6:22 PM Patient's tremors improved after receiving IV fluids and Ativan. No evidence of DTs at this time. Has been accepted to behavior health.  Leota Jacobsen, MD 12/29/13 Vernelle Emerald

## 2013-12-29 NOTE — Progress Notes (Signed)
43 year old male admitted on voluntary basis. Pt reports that he called 911 earlier and the reason being that he needed help as he realized his drinking was getting out of control. Pt reports binging on alcohol for the past 6 weeks and during which time he has been largely non-compliant with his medications. Pt reports that his symptoms have been getting progressively worse, indicating that he has been seeing shadow people and has been having fleeting suicidal thoughts but able to contract for safety on the unit. Pt has a splint on left hand from a break and stated he had surgery on it on May 4. Pt was oriented to the unit and safety maintained.

## 2013-12-29 NOTE — ED Notes (Signed)
Pt belongings (1 bag) in locker 29

## 2013-12-29 NOTE — BH Assessment (Signed)
Tele Assessment Note   [name] is an 43 y.o. male, single, Caucasian who presents unaccompanied to [facility] requesting treatment for alcohol dependence and symptoms of schizoaffective disorder. Pt has a history of schizoaffective disorder, PTSD and alcohol dependence and is receiving medication management through [facility]. He reports he fell and broke his little finger on his left hand two weeks ago and relapsed on alcohol. He states he has been drinking at least 12 beers daily. Pt states he has alcohol withdrawals when he stops drinking including tremors, nausea, vertigo and seizures. Pt believes he had a seizure today but is not sure. He states he has also been depressed with feelings of sadness and hopelessness. He states he has been seeing "shadow people" and they have been telling him "bad things" including that he should kill himself. Pt reports suicidal ideation with no plan or intent. He denies homicidal ideation or any history of violence. He also reports smoking marijuana 2-3 times per week. He denies other substance abuse.   Pt states his primary stressor is financial problems. He lives alone and has limited support. Pt reports he has been compliant with medications and they normally keep his hallucinations under control but they have been worse lately. Pt reports a history of several hospitalizations and was last hospitalized at [facility] in 2013. Pt has a history of PTSD from witnessing his fiancee commit suicide in 2009.  Pt is dressed in a hospital gown, disheveled, alert, oriented x4 with soft speech and tremulous motor behavior. Eye contact is poor. Pt's mood is depressed, sad and anxious and affect is congruent with mood. Thought process is coherent and relevant. Pt appears distracted and may be responding to internal stimuli. He does not appear experiencing delusional thought content. Pt calm and cooperative throughout assessment. He is willing to sign voluntarily into a  psychiatric hospital.    Axis I: 295.70 Schizoaffective Disorder; 303.90 Alcohol Use Disorder, Severe; 309.81 Posttraumatic Stress Disorder Axis II: Deferred Axis III:  Past Medical History  Diagnosis Date  . Depression   . Psychosis   . PTSD (post-traumatic stress disorder)   . Seizure disorder     related to etoh seizure  . Alcohol abuse   . Schizoaffective disorder    Axis IV: economic problems and problems with access to health care services Axis V: GAF=25  Past Medical History:  Past Medical History  Diagnosis Date  . Depression   . Psychosis   . PTSD (post-traumatic stress disorder)   . Seizure disorder     related to etoh seizure  . Alcohol abuse   . Schizoaffective disorder     Past Surgical History  Procedure Laterality Date  . Foot surgery      right  . Hand surgery      Family History: History reviewed. No pertinent family history.  Social History:  reports that he has been smoking Cigarettes.  He has been smoking about 1.50 packs per day. He has never used smokeless tobacco. He reports that he drinks about 50.4 ounces of alcohol per week. He reports that he uses illicit drugs (Marijuana) about once per week.  Additional Social History:  Alcohol / Drug Use Pain Medications: Denies abuse Prescriptions: Denies abuse Over the Counter: Denies abuse History of alcohol / drug use?: Yes Longest period of sobriety (when/how long): 2005-2008 Negative Consequences of Use: Financial;Legal;Personal relationships;Work / School Withdrawal Symptoms: Nausea / Vomiting;Tremors;Blackouts;Seizures;Other (Comment) (Vertigo) Onset of Seizures: unknown Date of most recent seizure: 12/29/13  per Pt report Substance #1 Name of Substance 1: Alcohol 1 - Age of First Use: 16 1 - Amount (size/oz): 12 or more beers  1 - Frequency: daily 1 - Duration: 2 weeks this episode 1 - Last Use / Amount: 12/29/13, 12 beers Substance #2 Name of Substance 2: Marijuana 2 - Age of First  Use: 16 2 - Amount (size/oz): 1/8 ounce 2 - Frequency: 2-3 times per week 2 - Duration: ongoing 2 - Last Use / Amount: 12/28/13  CIWA: CIWA-Ar BP: 130/96 mmHg Pulse Rate: 124 COWS:    Allergies: No Known Allergies  Home Medications:  (Not in a hospital admission)  OB/GYN Status:  No LMP for male patient.  General Assessment Data Location of Assessment: [facility] Is this a Tele or Face-to-Face Assessment?: Face-to-Face Is this an Initial Assessment or a Re-assessment for this encounter?: Initial Assessment Living Arrangements: Alone Can pt return to current living arrangement?: Yes Admission Status: Voluntary Is patient capable of signing voluntary admission?: Yes Transfer from: [facility] Referral Source: Self/Family/Friend     [place] Living Arrangements: Alone Name of Psychiatrist: Beverly Sessions Name of Therapist: None  Education Status Is patient currently in school?: No Current Grade: NA Highest grade of school patient has completed: NA Name of school: NA Contact person: NA  Risk to self Suicidal Ideation: Yes-Currently Present Suicidal Intent: No Is patient at risk for suicide?: Yes Suicidal Plan?: No Access to Means: No What has been your use of drugs/alcohol within the last 12 months?: Pt drinking alcohol daily and using marijuana Previous Attempts/Gestures: Yes How many times?: 2 Other Self Harm Risks: Pt reports command auditory hallucinations Triggers for Past Attempts: Hallucinations Intentional Self Injurious Behavior: None Family Suicide History: See progress notes;No Recent stressful life event(s): Financial Problems;Other (Comment) (Recently broke finger) Persecutory voices/beliefs?: Yes Depression: Yes Depression Symptoms: Despondent;Isolating;Loss of interest in usual pleasures;Feeling worthless/self pity Substance abuse history and/or treatment for substance abuse?: Yes Suicide prevention information given to non-admitted patients:  Not applicable  Risk to Others Homicidal Ideation: No Thoughts of Harm to Others: No Current Homicidal Intent: No Current Homicidal Plan: No Access to Homicidal Means: No Identified Victim: None History of harm to others?: No Assessment of Violence: None Noted Violent Behavior Description: None Does patient have access to weapons?: No Criminal Charges Pending?: No Does patient have a court date: No  Psychosis Hallucinations: Auditory;Visual;With command (See assessment note) Delusions: None noted  Mental Status Report Appear/Hygiene: In scrubs Eye Contact: Poor Motor Activity: Tremors Speech: Logical/coherent Level of Consciousness: Alert Mood: Depressed;Sad;Anxious Affect: Depressed;Sad;Anxious Anxiety Level: Moderate Thought Processes: Coherent;Relevant Judgement: Impaired Orientation: Person;Place;Time;Situation Obsessive Compulsive Thoughts/Behaviors: None  Cognitive Functioning Concentration: Decreased Memory: Recent Intact;Remote Intact IQ: Average Insight: Fair Impulse Control: Fair Appetite: Poor Weight Loss: 0 Weight Gain: 0 Sleep: No Change Total Hours of Sleep: 7 Vegetative Symptoms: None  ADLScreening [facility] Assessment Services) Patient's cognitive ability adequate to safely complete daily activities?: Yes Patient able to express need for assistance with ADLs?: Yes Independently performs ADLs?: Yes (appropriate for developmental age)  Prior Inpatient Therapy Prior Inpatient Therapy: Yes Prior Therapy Dates: 2013, multiple admits Prior Therapy Facilty/Provider(s): [facility], [facility], other facilities Reason for Treatment: Schizoaffective disorder, Alcohol Dependence, PTSD  Prior Outpatient Therapy Prior Outpatient Therapy: Yes Prior Therapy Dates: Current Prior Therapy Facilty/Provider(s): [facility] Reason for Treatment: Medication management  ADL Screening (condition at time of admission) Patient's cognitive ability adequate to safely complete daily  activities?: Yes Is the patient deaf or have difficulty  hearing?: No Does the patient have difficulty seeing, even when wearing glasses/contacts?: No Does the patient have difficulty concentrating, remembering, or making decisions?: No Patient able to express need for assistance with ADLs?: Yes Does the patient have difficulty dressing or bathing?: No Independently performs ADLs?: Yes (appropriate for developmental age) Does the patient have difficulty walking or climbing stairs?: No Weakness of Legs: None Weakness of Arms/Hands: None  Home Assistive Devices/Equipment Home Assistive Devices/Equipment: Eyeglasses    Abuse/Neglect Assessment (Assessment to be complete while patient is alone) Physical Abuse: Denies Verbal Abuse: Denies Sexual Abuse: Denies Exploitation of patient/patient's resources: Denies Self-Neglect: Denies Values / Beliefs Cultural Requests During Hospitalization: None Spiritual Requests During Hospitalization: None   Advance Directives (For Healthcare) Advance Directive: Patient does not have advance directive;Patient would not like information Pre-existing out of facility DNR order (yellow form or pink MOST form): No Nutrition Screen- [facility] Adult/[facility] Patient's home diet: Regular  Additional Information 1:1 In Past 12 Months?: No CIRT Risk: No Elopement Risk: No Does patient have medical clearance?: Yes     Disposition: Spoke to Debarah Crape, [facility] at [facility], who said a 400-hall bed is available. Gave clinical report to Glenda Chroman, NP who said Pt meets inpatient criteria and is accepted to the service of Dr. Darleene Cleaver, room 404-2. Notified Dr. Lacretia Leigh of acceptance.    Disposition Initial Assessment Completed for this Encounter: Yes Disposition of Patient: Inpatient treatment program Type of inpatient treatment program: Adult  Evelena Peat, [facility], [facility] Triage Specialist [phone]   Orpah Greek Anson Fret. 12/29/2013 6:48 PM

## 2013-12-29 NOTE — BH Assessment (Signed)
Assessment complete. Spoke to Debarah Crape, [facility] at [facility], who said a 400-hall bed is available. Gave clinical report to Glenda Chroman, NP who said Pt meets inpatient criteria and is accepted to the service of Dr. Darleene Cleaver, room 404-2. Notified Dr. Lacretia Leigh of acceptance.  Orpah Greek Rosana Hoes, [facility] Triage Specialist [phone]

## 2013-12-29 NOTE — ED Notes (Signed)
Pt has 1 personal belongings bag behind triage nursing station.

## 2013-12-29 NOTE — ED Notes (Addendum)
Per EMS, Pt, from home, presents wanting detox from ETOH, SI w/o a plan, and needs wound on L hand checked.  Pt reports Hx of seizures when detoxing and sts he had pins placed in hand x "a few weeks ago."  Last drink this morning, 2 beers.  Pt typically drinks 12 beers a day.     Pt reports that he self medicates w/ ETOH, because of his schizoeffective disorder.  Sts he is hearing voices that are telling him to kill himself, because he is "a piece of shit."  Sts that he thinks that he had a seizure this morning.  Sts he thinks that his hand is infected.

## 2013-12-29 NOTE — ED Notes (Signed)
Pt A&O x4. Ambulatory with steady gait. PIV removed. VSS. Belongings with patient and [place]. All necessary documentation given.

## 2013-12-30 ENCOUNTER — Encounter (HOSPITAL_COMMUNITY): Payer: Self-pay | Admitting: Psychiatry

## 2013-12-30 DIAGNOSIS — F1210 Cannabis abuse, uncomplicated: Secondary | ICD-10-CM | POA: Diagnosis present

## 2013-12-30 DIAGNOSIS — F1020 Alcohol dependence, uncomplicated: Secondary | ICD-10-CM

## 2013-12-30 MED ORDER — HYDROGEN PEROXIDE 3 % EX SOLN
Freq: Two times a day (BID) | CUTANEOUS | Status: DC
  Administered 2013-12-30 – 2014-01-05 (×13): via TOPICAL
  Filled 2013-12-30: qty 473

## 2013-12-30 MED ORDER — CARBAMAZEPINE ER 200 MG PO TB12
200.0000 mg | ORAL_TABLET | Freq: Two times a day (BID) | ORAL | Status: DC
  Administered 2013-12-30 – 2014-01-05 (×12): 200 mg via ORAL
  Filled 2013-12-30 (×5): qty 1
  Filled 2013-12-30: qty 6
  Filled 2013-12-30 (×3): qty 1
  Filled 2013-12-30: qty 6
  Filled 2013-12-30 (×6): qty 1

## 2013-12-30 MED ORDER — CHLORDIAZEPOXIDE HCL 25 MG PO CAPS
25.0000 mg | ORAL_CAPSULE | ORAL | Status: AC
  Administered 2014-01-01 (×2): 25 mg via ORAL
  Filled 2013-12-30 (×2): qty 1

## 2013-12-30 MED ORDER — PRAZOSIN HCL 2 MG PO CAPS: 4.0000 mg | ORAL_CAPSULE | Freq: Every day | ORAL | Status: DC

## 2013-12-30 MED ORDER — SERTRALINE HCL 100 MG PO TABS
200.0000 mg | ORAL_TABLET | Freq: Every day | ORAL | Status: DC
  Administered 2013-12-31 – 2014-01-05 (×6): 200 mg via ORAL
  Filled 2013-12-30 (×5): qty 2
  Filled 2013-12-30: qty 6
  Filled 2013-12-30 (×2): qty 2

## 2013-12-30 MED ORDER — CHLORDIAZEPOXIDE HCL 25 MG PO CAPS
50.0000 mg | ORAL_CAPSULE | Freq: Once | ORAL | Status: AC
  Administered 2013-12-30: 50 mg via ORAL
  Filled 2013-12-30: qty 2

## 2013-12-30 MED ORDER — PRAZOSIN HCL 2 MG PO CAPS
2.0000 mg | ORAL_CAPSULE | Freq: Every day | ORAL | Status: DC
  Administered 2013-12-30 – 2014-01-02 (×4): 2 mg via ORAL
  Filled 2013-12-30 (×5): qty 1

## 2013-12-30 MED ORDER — ONDANSETRON 4 MG PO TBDP: 4.0000 mg | ORAL_TABLET | Freq: Four times a day (QID) | ORAL | Status: AC | PRN

## 2013-12-30 MED ORDER — CHLORDIAZEPOXIDE HCL 25 MG PO CAPS
25.0000 mg | ORAL_CAPSULE | Freq: Every day | ORAL | Status: AC
  Administered 2014-01-02: 25 mg via ORAL
  Filled 2013-12-30: qty 1

## 2013-12-30 MED ORDER — CHLORDIAZEPOXIDE HCL 25 MG PO CAPS
25.0000 mg | ORAL_CAPSULE | Freq: Three times a day (TID) | ORAL | Status: AC
  Administered 2013-12-31 (×3): 25 mg via ORAL
  Filled 2013-12-30 (×3): qty 1

## 2013-12-30 MED ORDER — NICOTINE 21 MG/24HR TD PT24
21.0000 mg | MEDICATED_PATCH | Freq: Every day | TRANSDERMAL | Status: DC
  Administered 2013-12-30 – 2014-01-04 (×6): 21 mg via TRANSDERMAL
  Filled 2013-12-30 (×10): qty 1

## 2013-12-30 MED ORDER — CHLORDIAZEPOXIDE HCL 25 MG PO CAPS
25.0000 mg | ORAL_CAPSULE | Freq: Four times a day (QID) | ORAL | Status: AC
  Administered 2013-12-30 (×4): 25 mg via ORAL
  Filled 2013-12-30 (×4): qty 1
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
## 2013-12-30 NOTE — BHH Counselor (Signed)
[place] LCSW Group Therapy  12/30/2013  1:05 PM  Type of Therapy:  Group therapy  Participation Level:  Active  Participation Quality:  Attentive  Affect:  Flat  Cognitive:  Oriented  Insight:  Limited  Engagement in Therapy:  Limited  Modes of Intervention:  Discussion, Socialization  Summary of Progress/Problems:  Chaplain was here to lead a group on themes of hope and courage.  "Care is what you should have for yourself, but I do not.  I guess I have low self esteem.  Part of it is the guilt I carry.  I do care for my family.  They mean everything to me.  Sometimes I feel suicidal, but I am afraid to die."  Render identified that showing care for others is something that has helped him feel better of himself, and worthy of care.  He gave an example.  It was pointed out to him by another group member that by him coming and sharing, he was helping and caring for others.  It brought a smile to his face.  [name] 12/30/2013 1:18 PM

## 2013-12-30 NOTE — H&P (Signed)
Psychiatric Admission Assessment Adult  Patient Identification:  [name] Date of Evaluation:  12/30/2013 Chief Complaint:  "I have been hearing voices and seeing things."  History of Present Illness::  [name] is a 43 year old male who presented voluntarily to [facility] requesting treatment for alcohol abuse and symptoms of schizoaffective. He reports relapsing on alcohol two weeks ago after injuring his pinky finger to left hand. Patient has been drinking about twelve beers every day. [name] reports that he has continued to take his psychiatric medications but feels that have not been working as well lately. Patient states today during his psychiatric assessment "I hear voices and see things. They are demeaning and tell me to hurt myself. I also see shadow people at night. It is really very terrifying. These symptoms have been going on since I was 21. I have been drinking to cope. I am also struggling with financial problems. I live alone. I am on Disability. I get bad withdrawal symptoms when I quit drinking like tremors, nausea, and seizures." Patient was cooperative with his admission assessment.   Elements:  Location:  Depression, Psychosis . Quality:  Began to abuse alcohol to deal with psychosis . Severity:  Severe . Timing:  Last few weeks . Duration:  "Started at age 21" . Context:  Increased depression, auditory/visual hallucinations . Associated Signs/Synptoms: Depression Symptoms:  depressed mood, anhedonia, feelings of worthlessness/guilt, difficulty concentrating, recurrent thoughts of death, suicidal thoughts without plan, anxiety, loss of energy/fatigue, disturbed sleep, (Hypo) Manic Symptoms:  Denies Anxiety Symptoms:  Excessive Worry, Psychotic Symptoms:  Hallucinations: Auditory Visual PTSD Symptoms: Had a traumatic exposure:  Reports witnessing finance committ suicide by self inflicted gunshot wound.  Re-experiencing:  Flashbacks Intrusive  Thoughts Nightmares Hypervigilance:  Yes Hyperarousal:  Increased Startle Response Total Time spent with patient: 45 minutes  Psychiatric Specialty Exam: Physical Exam  Constitutional:  Physical exam findings reviewed from [place] and I concur with no noted exceptions.     Review of Systems  Constitutional: Negative.   HENT: Negative.   Eyes: Negative.   Respiratory: Negative.   Cardiovascular: Negative.   Gastrointestinal: Negative.   Genitourinary: Negative.   Musculoskeletal:       Patient's left hand has intact splint from recent hand surgery.   Skin: Negative.   Neurological: Negative.   Endo/Heme/Allergies: Negative.     Blood pressure 111/80, pulse 88, temperature 97.5 F (36.4 C), temperature source Oral, resp. rate 18, height $RemoveBe'5\' 9"'AhOHhBMOE$  (1.753 m), weight 90.719 kg (200 lb).Body mass index is 29.52 kg/(m^2).  General Appearance: Disheveled  Eye Contact::  Minimal  Speech:  Clear and Coherent and Normal Rate  Volume:  Decreased  Mood:  Anxious and Depressed  Affect:  Appropriate  Thought Process:  Goal Directed  Orientation:  Full (Time, Place, and Person)  Thought Content:  Delusions and Hallucinations: Visual  Suicidal Thoughts:  Yes.  without intent/plan  Homicidal Thoughts:  No  Memory:  Immediate;   Fair Recent;   Fair Remote;   Fair  Judgement:  Impaired  Insight:  Shallow  Psychomotor Activity:  Decreased  Concentration:  Fair  Recall:  [employer] of Knowledge:Fair  Language: Good  Akathisia:  No  Handed:  Right  AIMS (if indicated):     Assets:  Communication Skills  Sleep:  Number of Hours: 4.75    Musculoskeletal: Strength & Muscle Tone: within normal limits Gait & Station: normal Patient leans: N/A  Past Psychiatric History:Yes Diagnosis:Schizoaffective, PTSD  Hospitalizations:[facility]  Outpatient Care:[facility]  Substance Abuse Care: Denies  Self-Mutilation:Denies   Suicidal Attempts:"I have overdosed and tried to cut my wrist before."   Violent  Behaviors:Denies    Past Medical History:   Past Medical History  Diagnosis Date  . Depression   . Psychosis   . PTSD (post-traumatic stress disorder)   . Seizure disorder     related to etoh seizure  . Alcohol abuse   . Schizoaffective disorder   . Anxiety    None. Allergies:  No Known Allergies PTA Medications: Prescriptions prior to admission  Medication Sig Dispense Refill  . carbamazepine (TEGRETOL) 200 MG tablet Take 200 mg by mouth daily after breakfast.      . QUEtiapine (SEROQUEL) 200 MG tablet Take 200-600 mg by mouth 2 (two) times daily. Takes 242m in the morning and 6012mat bedtime      . ranitidine (ZANTAC) 75 MG tablet Take 75 mg by mouth daily as needed for heartburn.      . sertraline (ZOLOFT) 100 MG tablet Take 150 mg by mouth daily after breakfast.        Previous Psychotropic Medications:  Medication/Dose                 Substance Abuse History in the last 12 months:  yes  Consequences of Substance Abuse: Medical Consequences:  Liver enzymes are note to be mildly elevated.  Withdrawal Symptoms:   Nausea Tremors Seizures  Patient denies drug use. However, his UDS is positive for ampetamines and marijuana. Alchol level is 61.   Social History:  reports that he has been smoking Cigarettes.  He has been smoking about 1.50 packs per day. He has never used smokeless tobacco. He reports that he drinks about 50.4 ounces of alcohol per week. He reports that he uses illicit drugs (Marijuana) about once per week. Additional Social History:                      Current Place of Residence:   Place of Birth:   Family Members: Marital Status:  Divorced Children:  Sons:  Daughters: Relationships: Education:  CoDentistroblems/Performance: Religious Beliefs/Practices: History of Abuse (Emotional/Phsycial/Sexual) OcShip brokeristory:  None. Legal History: Hobbies/Interests:  Family History:  History  reviewed. No pertinent family history.  Results for orders placed during the hospital encounter of 12/29/13 (from the past 72 hour(s))  URINE RAPID DRUG SCREEN (HOSP PERFORMED)     Status: Abnormal   Collection Time    12/29/13  3:03 PM      Result Value Ref Range   Opiates NONE DETECTED  NONE DETECTED   Cocaine NONE DETECTED  NONE DETECTED   Benzodiazepines NONE DETECTED  NONE DETECTED   Amphetamines POSITIVE (*) NONE DETECTED   Tetrahydrocannabinol POSITIVE (*) NONE DETECTED   Barbiturates NONE DETECTED  NONE DETECTED   Comment:            DRUG SCREEN FOR MEDICAL PURPOSES     ONLY.  IF CONFIRMATION IS NEEDED     FOR ANY PURPOSE, NOTIFY LAB     WITHIN 5 DAYS.                LOWEST DETECTABLE LIMITS     FOR URINE DRUG SCREEN     Drug Class       Cutoff (ng/mL)     Amphetamine      1000     Barbiturate      200     Benzodiazepine  785     Tricyclics       885     Opiates          300     Cocaine          300     THC              50  CBC     Status: None   Collection Time    12/29/13  3:15 PM      Result Value Ref Range   WBC 7.8  4.0 - 10.5 K/uL   RBC 5.00  4.22 - 5.81 MIL/uL   Hemoglobin 16.9  13.0 - 17.0 g/dL   HCT 47.7  39.0 - 52.0 %   MCV 95.4  78.0 - 100.0 fL   MCH 33.8  26.0 - 34.0 pg   MCHC 35.4  30.0 - 36.0 g/dL   RDW 14.2  11.5 - 15.5 %   Platelets 370  150 - 400 K/uL  COMPREHENSIVE METABOLIC PANEL     Status: Abnormal   Collection Time    12/29/13  3:15 PM      Result Value Ref Range   Sodium 140  137 - 147 mEq/L   Potassium 4.1  3.7 - 5.3 mEq/L   Chloride 96  96 - 112 mEq/L   CO2 25  19 - 32 mEq/L   Glucose, Bld 122 (*) 70 - 99 mg/dL   BUN 9  6 - 23 mg/dL   Creatinine, Ser 0.76  0.50 - 1.35 mg/dL   Calcium 10.0  8.4 - 10.5 mg/dL   Total Protein 7.7  6.0 - 8.3 g/dL   Albumin 4.1  3.5 - 5.2 g/dL   AST 49 (*) 0 - 37 U/L   ALT 37  0 - 53 U/L   Alkaline Phosphatase 114  39 - 117 U/L   Total Bilirubin 0.2 (*) 0.3 - 1.2 mg/dL   GFR calc non Af Amer >90   >90 mL/min   GFR calc Af Amer >90  >90 mL/min   Comment: (NOTE)     The eGFR has been calculated using the CKD EPI equation.     This calculation has not been validated in all clinical situations.     eGFR's persistently <90 mL/min signify possible Chronic Kidney     Disease.  ETHANOL     Status: Abnormal   Collection Time    12/29/13  3:15 PM      Result Value Ref Range   Alcohol, Ethyl (B) 61 (*) 0 - 11 mg/dL   Comment:            LOWEST DETECTABLE LIMIT FOR     SERUM ALCOHOL IS 11 mg/dL     FOR MEDICAL PURPOSES ONLY  CARBAMAZEPINE LEVEL, TOTAL     Status: Abnormal   Collection Time    12/29/13  3:15 PM      Result Value Ref Range   Carbamazepine Lvl 3.1 (*) 4.0 - 12.0 ug/mL   Comment: Performed at [facility]   Psychological Evaluations:  Assessment:   DSM5:  AXIS I:  Schizoaffective disorder, depressed type              Post traumatic stress disorder              Alcohol dependence  AXIS II:  Deferred AXIS III:   Past Medical History  Diagnosis Date  . Depression   . Psychosis   . PTSD (  post-traumatic stress disorder)   . Seizure disorder     related to etoh seizure  . Alcohol abuse   . Schizoaffective disorder   . Anxiety    AXIS IV:  economic problems, occupational problems and other psychosocial or environmental problems AXIS V:  31-40 impairment in reality testing   Treatment Plan/Recommendations:   1. Admit for crisis management and stabilization. Estimated length of stay 5-7 days. 2. Medication management to reduce current symptoms to base line and improve the patient's level of functioning.  3. Develop treatment plan to decrease risk of relapse upon discharge of depressive and psychotic symptoms and the need for readmission. 5. Group therapy to facilitate development of healthy coping skills to use for depression and psychosis 6. Health care follow up as needed for medical problems.  7. Discharge plan to include therapy to help patient cope  with  stressors.  8. Call for Consult with Hospitalist for additional specialty patient services as needed.   Treatment Plan Summary: Daily contact with patient to assess and evaluate symptoms and progress in treatment Medication management Current Medications:  Current Facility-Administered Medications  Medication Dose Route Frequency Provider Last Rate Last Dose  . acetaminophen (TYLENOL) tablet 650 mg  650 mg Oral Q6H PRN Evanna Glenda Chroman, NP      . alum & mag hydroxide-simeth (MAALOX/MYLANTA) 200-200-20 MG/5ML suspension 30 mL  30 mL Oral Q4H PRN Evanna Glenda Chroman, NP      . carbamazepine (TEGRETOL XR) 12 hr tablet 200 mg  200 mg Oral BID Alvera Tourigny      . chlordiazePOXIDE (LIBRIUM) capsule 25 mg  25 mg Oral Q6H PRN Malena Peer, NP   25 mg at 12/29/13 2232  . chlordiazePOXIDE (LIBRIUM) capsule 25 mg  25 mg Oral QID Lurena Nida, NP   25 mg at 12/30/13 1121   Followed by  . [START ON 12/31/2013] chlordiazePOXIDE (LIBRIUM) capsule 25 mg  25 mg Oral TID Lurena Nida, NP       Followed by  . [START ON 01/01/2014] chlordiazePOXIDE (LIBRIUM) capsule 25 mg  25 mg Oral BH-qamhs Lurena Nida, NP       Followed by  . [START ON 01/02/2014] chlordiazePOXIDE (LIBRIUM) capsule 25 mg  25 mg Oral Daily Lurena Nida, NP      . hydrogen peroxide 3 % external solution   Topical BID Kjersti Dittmer      . hydrOXYzine (ATARAX/VISTARIL) tablet 25 mg  25 mg Oral Q6H PRN Evanna Glenda Chroman, NP      . ibuprofen (ADVIL,MOTRIN) tablet 600 mg  600 mg Oral Q6H PRN Benjamine Mola, FNP   600 mg at 12/29/13 2232  . loperamide (IMODIUM) capsule 2-4 mg  2-4 mg Oral PRN Evanna Glenda Chroman, NP      . magnesium hydroxide (MILK OF MAGNESIA) suspension 30 mL  30 mL Oral Daily PRN Evanna Glenda Chroman, NP      . multivitamin with minerals tablet 1 tablet  1 tablet Oral Daily Evanna Glenda Chroman, NP   1 tablet at 12/30/13 0806  . nicotine (NICODERM CQ - dosed in mg/24 hours) patch 21 mg  21 mg Transdermal  Daily Breona Cherubin   21 mg at 12/30/13 0837  . ondansetron (ZOFRAN-ODT) disintegrating tablet 4 mg  4 mg Oral Q6H PRN Lurena Nida, NP      . prazosin (MINIPRESS) capsule 2 mg  2 mg Oral QHS Tivon Lemoine      . QUEtiapine (  SEROQUEL) tablet 200 mg  200 mg Oral Q breakfast Benjamine Mola, FNP   200 mg at 12/30/13 2563  . QUEtiapine (SEROQUEL) tablet 600 mg  600 mg Oral QHS Benjamine Mola, FNP   600 mg at 12/29/13 2231  . [START ON 12/31/2013] sertraline (ZOLOFT) tablet 200 mg  200 mg Oral Daily Roy Snuffer      . thiamine (VITAMIN B-1) tablet 100 mg  100 mg Oral Daily Evanna Glenda Chroman, NP   100 mg at 12/30/13 0806    Observation Level/Precautions:  15 minute checks  Laboratory:  CBC Chemistry Profile UDS  Psychotherapy:  Individual and Group Therapy   Medications:  Librium protocol for alcohol detox, Tegretol XR 200 mg BID for improved mood stability, Minipress 2 mg hs for PTSD symptoms, Seroquel 200 mg in am and 600 mg at hs for psychosis.   Consultations:  As needed  Discharge Concerns:  Safety and Stability   Estimated LOS: 5-7 days   Other:  Increase collateral information   I certify that inpatient services furnished can reasonably be expected to improve the patient's condition.   Elmarie Shiley NP-C 5/15/201512:04 PM   Patient seen, evaluated and I agree with notes by Nurse Practitioner. Corena Pilgrim, MD

## 2013-12-30 NOTE — Progress Notes (Signed)
NUTRITION ASSESSMENT  Pt identified as at risk on the Malnutrition Screen Tool  INTERVENTION: Continue current diet with meals and snacks. MVI and thiamine daily.  NUTRITION DIAGNOSIS: Unintentional weight loss related to sub-optimal intake as evidenced by pt report.   Goal: Pt to meet >/= 90% of their estimated nutrition needs.  Monitor:  PO intake  Assessment:  Patient admitted with etoh abuse, hx of schizoaffective disorder and psychosis.  Staff reports patient with good intake.  Patient has lost 10 lbs in the last 3 weeks.  43 y.o. male  Height: Ht Readings from Last 1 Encounters:  12/29/13 5\' 9"  (1.753 m)    Weight: Wt Readings from Last 1 Encounters:  12/29/13 200 lb (90.719 kg)    Weight Hx: Wt Readings from Last 10 Encounters:  12/29/13 200 lb (90.719 kg)  12/13/13 210 lb (95.255 kg)  10/30/11 220 lb (99.791 kg)  07/03/11 209 lb 7 oz (95 kg)    BMI:  Body mass index is 29.52 kg/(m^2). Pt meets criteria for overweight based on current BMI.  Estimated Nutritional Needs: Kcal: 25-30 kcal/kg Protein: > 1 gram protein/kg Fluid: 1 ml/kcal  Diet Order:   Pt is also offered choice of unit snacks mid-morning and mid-afternoon.  Pt is eating as desired.   Lab results and medications reviewed.   Antonieta Iba, RD, LDN Clinical Inpatient Dietitian Pager:  [phone] Weekend and after hours pager:  [phone]

## 2013-12-30 NOTE — [facility] Suicide Risk Assessment (Signed)
   Nursing information obtained from:    Demographic factors:    Current Mental Status:    Loss Factors:    Historical Factors:    Risk Reduction Factors:    Total Time spent with patient: 30 minutes  CLINICAL FACTORS:   Severe Anxiety and/or Agitation Depression:   Anhedonia Comorbid alcohol abuse/dependence Delusional Hopelessness Impulsivity Insomnia Alcohol/Substance Abuse/Dependencies More than one psychiatric diagnosis Currently Psychotic Unstable or Poor Therapeutic Relationship  Psychiatric Specialty Exam: Physical Exam  Psychiatric: His speech is normal. His mood appears anxious. He is actively hallucinating. Thought content is paranoid. Cognition and memory are normal. He expresses impulsivity. He exhibits a depressed mood. He expresses suicidal ideation.    Review of Systems  Constitutional: Positive for malaise/fatigue and diaphoresis.  HENT: Negative.   Eyes: Negative.   Respiratory: Negative.   Cardiovascular: Negative.   Gastrointestinal: Positive for nausea.  Genitourinary: Negative.   Musculoskeletal: Positive for myalgias.  Skin: Negative.   Neurological: Positive for tremors and weakness.  Endo/Heme/Allergies: Negative.   Psychiatric/Behavioral: Positive for depression, suicidal ideas, hallucinations and substance abuse. The patient is nervous/anxious and has insomnia.     Blood pressure 135/94, pulse 96, temperature 97.5 F (36.4 C), temperature source Oral, resp. rate 18, height 5\' 9"  (1.753 m), weight 90.719 kg (200 lb).Body mass index is 29.52 kg/(m^2).  General Appearance: Disheveled  Eye Contact::  Minimal  Speech:  Clear and Coherent and Normal Rate  Volume:  Decreased  Mood:  Anxious and Depressed  Affect:  Appropriate  Thought Process:  Goal Directed  Orientation:  Full (Time, Place, and Person)  Thought Content:  Delusions and Hallucinations: Visual  Suicidal Thoughts:  Yes.  without intent/plan  Homicidal Thoughts:  No  Memory:   Immediate;   Fair Recent;   Fair Remote;   Fair  Judgement:  Impaired  Insight:  Shallow  Psychomotor Activity:  Decreased  Concentration:  Fair  Recall:  [employer] of Knowledge:Fair  Language: Good  Akathisia:  No  Handed:  Right  AIMS (if indicated):     Assets:  Communication Skills  Sleep:  Number of Hours: 4.75   Musculoskeletal: Strength & Muscle Tone: within normal limits Gait & Station: normal Patient leans: N/A  COGNITIVE FEATURES THAT CONTRIBUTE TO RISK:  Closed-mindedness Polarized thinking    SUICIDE RISK:   Mild:  Suicidal ideation of limited frequency, intensity, duration, and specificity.  There are no identifiable plans, no associated intent, mild dysphoria and related symptoms, good self-control (both objective and subjective assessment), few other risk factors, and identifiable protective factors, including available and accessible social support.  PLAN OF CARE:1. Admit for crisis management and stabilization. 2. Medication management to reduce current symptoms to base line and improve the  patient's overall level of functioning 3. Treat health problems as indicated. 4. Develop treatment plan to decrease risk of relapse upon discharge and the need for  readmission. 5. Psycho-social education regarding relapse prevention and self care. 6. Health care follow up as needed for medical problems. 7. Restart home medications where appropriate.   I certify that inpatient services furnished can reasonably be expected to improve the patient's condition.  Corena Pilgrim, MD 12/30/2013, 11:49 AM

## 2013-12-30 NOTE — Tx Team (Signed)
  Interdisciplinary Treatment Plan Update   Date Reviewed:  12/30/2013  Time Reviewed:  8:14 AM  Progress in Treatment:   Attending groups: Yes Participating in groups: Yes Taking medication as prescribed: Yes  Tolerating medication: Yes Family/Significant other contact made: No Patient understands diagnosis: Yes AEB asking for help with depression, anxiety, SI and psychosis Discussing patient identified problems/goals with staff: Yes  See initial care plan Medical problems stabilized or resolved: Yes Denies suicidal/homicidal ideation: No  But contracts for safety Patient has not harmed self or others: Yes  For review of initial/current patient goals, please see plan of care.  Estimated Length of Stay:  4-5 days  Reason for Continuation of Hospitalization: Anxiety Depression Hallucinations Medication stabilization Withdrawal symptoms  New Problems/Goals identified:  N/A  Discharge Plan or Barriers:   return home, follow up outpt  Additional Comments:  [name] is an 43 y.o. male, single, Caucasian who presents unaccompanied to [facility] requesting treatment for alcohol dependence and symptoms of schizoaffective disorder. Pt has a history of schizoaffective disorder, PTSD and alcohol dependence and is receiving medication management through [facility]. He reports he fell and broke his little finger on his left hand two weeks ago and relapsed on alcohol. He states he has been drinking at least 12 beers daily. Pt states he has alcohol withdrawals when he stops drinking including tremors, nausea, vertigo and seizures. Pt believes he had a seizure today but is not sure. He states he has also been depressed with feelings of sadness and hopelessness. He states he has been seeing "shadow people" and they have been telling him "bad things" including that he should kill himself. Pt reports suicidal ideation with no plan or intent. He denies homicidal ideation or any history of  violence. He also reports smoking marijuana 2-3 times per week. He denies other substance abuse.    Attendees:  Signature: Corena Pilgrim, MD 12/30/2013 8:14 AM   Signature: Ripley Fraise, LCSW 12/30/2013 8:14 AM  Signature: Elmarie Shiley, NP 12/30/2013 8:14 AM  Signature: Mayra Neer, RN 12/30/2013 8:14 AM  Signature: Darrol Angel, RN 12/30/2013 8:14 AM  Signature:  12/30/2013 8:14 AM  Signature:   12/30/2013 8:14 AM  Signature:    Signature:    Signature:    Signature:    Signature:    Signature:      Scribe for Treatment Team:   Ripley Fraise, LCSW  12/30/2013 8:14 AM

## 2013-12-30 NOTE — [facility] Group Notes (Signed)
[facility] LCSW Aftercare Discharge Planning Group Note   12/30/2013 11:05 AM  Participation Quality:  Engaged  Mood/Affect:  Flat  Depression Rating:  6  Anxiety Rating:  8  Thoughts of Suicide:  Yes Will you contract for safety?   Yes  Current AVH:  Yes  Plan for Discharge/Comments:  [name] is c/o all symptoms.  He admits to [facility] and is asking for help with detox as well.  States he was recently approved for disability and has his own place.  His brother and sister are supports.  Transportation Means: family  Supports: family  [name]

## 2013-12-30 NOTE — Progress Notes (Signed)
Patient ID: Chaim Gatley, male   DOB: [birth year], 43 y.o.   MRN: [id number] D. Patient presents with anxious mood, affect congruent. Trajan reports that he is continuing to have anxiety, poor sleep, depression and withdrawal symptoms. He states '' I didn't sleep that great last night and I'm still feeling very shaky and anxious this morning. I also need to get my hand cleaned with peroxide and take a shower. I'm not hearing or seeing things now but I still feel depressed and I still think of hurting myself sometimes '' Patient did complete self inventory and rates his hopelessness and depression at 6/10 on scale, 10 being worst depression 1 being least. He also continues to endorse fleeting SI , but is able to contract for safety with Probation officer. A. Medications given as ordered . Support and encouragement provided. Discussed above information with Dr. Darleene Cleaver. R. Patient remains in no acute distress at this time. No further voiced concerns at this time. Will continue to monitor q 15 minutes for safety.

## 2013-12-30 NOTE — Progress Notes (Signed)
[facility] Group Notes:  (Nursing/MHT/Case Management/Adjunct)  Date:  12/30/2013  Time:  10:53 PM  Type of Therapy:  Psychoeducational Skills  Participation Level:  Active  Participation Quality:  Appropriate  Affect:  Depressed  Cognitive:  Appropriate  Insight:  Good  Engagement in Group:  Engaged  Modes of Intervention:  Education  Summary of Progress/Problems: The patient described his day as having been "not the greatest". The patient quickly recovered by stating that the voices are less bothersome than yesterday. He also admitted to having a history of non-medication compliance which led him to drinking alcohol and also blacking out on occasion. He understands the importance of taking his medication as prescribed and that their are consequences for replacing the medication with alcohol. In terms of the theme for the day, his relapse prevention will include spending more time with his family and not isolating himself as much.   Gennette Pac 12/30/2013, 10:53 PM

## 2013-12-30 NOTE — Progress Notes (Signed)
Pt denies any HI/AVH. Pt is SI with no plan. Pt verbally contracts for safety. Pt is experiencing the following detox symptoms: anxiety, tremors, and sweating. In between checks, this pt was observed shaking in his bed. Writer assessed pt immediately.. Pt reports feeling "out of it". Pt was alert and orientated times 4. Pt's vitals were wnl. Pt was not due for another prn librium. On-call extender notified. An one time order for librium 50 mg was given. Writer administered the Librium as prescribed. Pt in bed resting with eyes closed at follow-up.

## 2013-12-31 DIAGNOSIS — F1010 Alcohol abuse, uncomplicated: Secondary | ICD-10-CM

## 2013-12-31 DIAGNOSIS — F259 Schizoaffective disorder, unspecified: Principal | ICD-10-CM

## 2013-12-31 DIAGNOSIS — F4310 Post-traumatic stress disorder, unspecified: Secondary | ICD-10-CM

## 2013-12-31 DIAGNOSIS — R45851 Suicidal ideations: Secondary | ICD-10-CM

## 2013-12-31 NOTE — Progress Notes (Signed)
[place] Group Notes:  (Nursing/MHT/Case Management/Adjunct)  Date:  12/31/2013  Time:  8:55 PM  Type of Therapy:  Psychoeducational Skills  Participation Level:  Active  Participation Quality:  Attentive  Affect:  Angry  Cognitive:  Lacking  Insight:  Lacking  Engagement in Group:  Developing/Improving  Modes of Intervention:  Education  Summary of Progress/Problems: The patient shared in group that he had a "pretty fucked up" day overall. He explained that he became upset when the doctor informed him that he tested positive for amphetamines. He admits that he used to take them, but that was years ago. The patient expressed that he let the issue go and tried to focus on other things instead. As a theme for the day, his support system will consist of his brother, sister, and his family.   Gennette Pac 12/31/2013, 8:55 PM

## 2013-12-31 NOTE — Progress Notes (Signed)
Patient ID: [name], male   DOB: [birth year], 43 y.o.   MRN: [id number] Psychoeducational Group Note  Date:  12/31/2013 Time:0910am  Group Topic/Focus:  Identifying Needs:   The focus of this group is to help patients identify their personal needs that have been historically problematic and identify healthy behaviors to address their needs.  Participation Level:  Active  Participation Quality:  Appropriate  Affect:  Appropriate  Cognitive:  Appropriate  Insight:  Supportive  Engagement in Group:  Supportive  Additional Comments:  Inventory group   Pricilla Larsson 12/31/2013,10:21 AM

## 2013-12-31 NOTE — Progress Notes (Signed)
Patient ID: [name], male   DOB: [birth year], 43 y.o.   MRN: [id number] D. The patient has a depressed mood and flat affect. Stated that he did not have a good day, but then added that he is experiencing a lesser amount of hallucinations. Reported that he feels his left hand is infected at site where pins were inserted. There is an odor with a small amount of old crusted drainage noted around the two pins. Area around pins is reddened. Some tremors noted. A. Met with patient to assess. Encouraged to attend evening group. Pin care given on left hand. Hand wrapped slightly in gauze and splint reapplied. R. Ciwa = 5. Attended and participated in evening group. Verbally contracted for safety. Compliant with medication.

## 2013-12-31 NOTE — Progress Notes (Signed)
[facility] MD Progress Note  12/31/2013 10:41 AM [name]  MRN:  [id number] Subjective:  I'm still hearing voices and seeing things.  I cannot sleep.  I still have tremors and shakes.  I have nightmares and flashback.  However I do not have any side effects of medication.  Axis I: Alcohol Abuse, Post Traumatic Stress Disorder and Schizoaffective Disorder Axis II: Deferred Axis III:  Past Medical History  Diagnosis Date  . Depression   . Psychosis   . PTSD (post-traumatic stress disorder)   . Seizure disorder     related to etoh seizure  . Alcohol abuse   . Schizoaffective disorder   . Anxiety    Axis IV: economic problems, other psychosocial or environmental problems and problems related to social environment Axis V: 21-30 behavior considerably influenced by delusions or hallucinations OR serious impairment in judgment, communication OR inability to function in almost all areas  ADL's:  Intact  Sleep: Fair  Appetite:  Fair  Suicidal Ideation:  Plan:  Patient is to have suicidal thoughts but no plan Homicidal Ideation:  Plan:  Patient denies and homicidal thoughts. AEB (as evidenced by):  Psychiatric Specialty Exam: Physical Exam  Review of Systems  Psychiatric/Behavioral: Positive for depression, suicidal ideas and hallucinations. The patient is nervous/anxious and has insomnia.     Blood pressure 119/86, pulse 107, temperature 97.5 F (36.4 C), temperature source Oral, resp. rate 16, height _0  (1.753 m), weight 200 lb (90.719 kg).Body mass index is 29.52 kg/(m^2).  General Appearance: Casual and Guarded  Eye Contact::  Fair  Speech:  Slow  Volume:  Decreased  Mood:  Anxious, Depressed, Dysphoric and Irritable  Affect:  Constricted and Depressed  Thought Process:  Loose  Orientation:  Full (Time, Place, and Person)  Thought Content:  Hallucinations: Auditory and Paranoid Ideation  Suicidal Thoughts:  Yes.  without intent/plan  Homicidal Thoughts:  No  Memory:   Immediate;   Fair Recent;   Fair Remote;   Fair  Judgement:  Impaired  Insight:  Shallow  Psychomotor Activity:  Decreased and Tremor  Concentration:  Fair  Recall:  [place]: Fair  Akathisia:  No  Handed:  Right  AIMS (if indicated):     Assets:  Communication Skills Desire for Improvement  Sleep:  Number of Hours: 5.75   Musculoskeletal: Strength & Muscle Tone: within normal limits Gait & Station: normal Patient leans: N/A  Current Medications: Current Facility-Administered Medications  Medication Dose Route Frequency Provider Last Rate Last Dose  . acetaminophen (TYLENOL) tablet 650 mg  650 mg Oral Q6H PRN Evanna Glenda Chroman, NP      . alum & mag hydroxide-simeth (MAALOX/MYLANTA) 200-200-20 MG/5ML suspension 30 mL  30 mL Oral Q4H PRN Evanna Glenda Chroman, NP      . carbamazepine (TEGRETOL XR) 12 hr tablet 200 mg  200 mg Oral BID Mojeed Akintayo   200 mg at 12/31/13 0802  . chlordiazePOXIDE (LIBRIUM) capsule 25 mg  25 mg Oral Q6H PRN Malena Peer, NP   25 mg at 12/29/13 2232  . chlordiazePOXIDE (LIBRIUM) capsule 25 mg  25 mg Oral TID Lurena Nida, NP   25 mg at 12/31/13 0758   Followed by  . [START ON 01/01/2014] chlordiazePOXIDE (LIBRIUM) capsule 25 mg  25 mg Oral BH-qamhs Lurena Nida, NP       Followed by  . [START ON 01/02/2014] chlordiazePOXIDE (LIBRIUM) capsule 25 mg  25 mg Oral Daily Manus Gunning  Barrett Henle, NP      . hydrogen peroxide 3 % external solution   Topical BID Mojeed Akintayo      . hydrOXYzine (ATARAX/VISTARIL) tablet 25 mg  25 mg Oral Q6H PRN Malena Peer, NP   25 mg at 12/30/13 1435  . ibuprofen (ADVIL,MOTRIN) tablet 600 mg  600 mg Oral Q6H PRN Benjamine Mola, FNP   600 mg at 12/31/13 0802  . loperamide (IMODIUM) capsule 2-4 mg  2-4 mg Oral PRN Malena Peer, NP   4 mg at 12/30/13 1655  . magnesium hydroxide (MILK OF MAGNESIA) suspension 30 mL  30 mL Oral Daily PRN Evanna Glenda Chroman, NP      . multivitamin with  minerals tablet 1 tablet  1 tablet Oral Daily Evanna Glenda Chroman, NP   1 tablet at 12/31/13 0756  . nicotine (NICODERM CQ - dosed in mg/24 hours) patch 21 mg  21 mg Transdermal Daily Mojeed Akintayo   21 mg at 12/31/13 0756  . ondansetron (ZOFRAN-ODT) disintegrating tablet 4 mg  4 mg Oral Q6H PRN Lurena Nida, NP      . prazosin (MINIPRESS) capsule 2 mg  2 mg Oral QHS Mojeed Akintayo   2 mg at 12/30/13 2113  . QUEtiapine (SEROQUEL) tablet 200 mg  200 mg Oral Q breakfast Benjamine Mola, FNP   200 mg at 12/31/13 0756  . QUEtiapine (SEROQUEL) tablet 600 mg  600 mg Oral QHS Benjamine Mola, FNP   600 mg at 12/30/13 2113  . sertraline (ZOLOFT) tablet 200 mg  200 mg Oral Daily Mojeed Akintayo   200 mg at 12/31/13 0819  . thiamine (VITAMIN B-1) tablet 100 mg  100 mg Oral Daily Evanna Glenda Chroman, NP   100 mg at 12/31/13 5726    Lab Results:  Results for orders placed during the hospital encounter of 12/29/13 (from the past 48 hour(s))  URINE RAPID DRUG SCREEN (HOSP PERFORMED)     Status: Abnormal   Collection Time    12/29/13  3:03 PM      Result Value Ref Range   Opiates NONE DETECTED  NONE DETECTED   Cocaine NONE DETECTED  NONE DETECTED   Benzodiazepines NONE DETECTED  NONE DETECTED   Amphetamines POSITIVE (*) NONE DETECTED   Tetrahydrocannabinol POSITIVE (*) NONE DETECTED   Barbiturates NONE DETECTED  NONE DETECTED   Comment:            DRUG SCREEN FOR MEDICAL PURPOSES     ONLY.  IF CONFIRMATION IS NEEDED     FOR ANY PURPOSE, NOTIFY LAB     WITHIN 5 DAYS.                LOWEST DETECTABLE LIMITS     FOR URINE DRUG SCREEN     Drug Class       Cutoff (ng/mL)     Amphetamine      1000     Barbiturate      200     Benzodiazepine   203     Tricyclics       559     Opiates          300     Cocaine          300     THC              50  CBC     Status: None   Collection Time    12/29/13  3:15 PM      Result Value Ref Range   WBC 7.8  4.0 - 10.5 K/uL   RBC 5.00  4.22 - 5.81 MIL/uL    Hemoglobin 16.9  13.0 - 17.0 g/dL   HCT 47.7  39.0 - 52.0 %   MCV 95.4  78.0 - 100.0 fL   MCH 33.8  26.0 - 34.0 pg   MCHC 35.4  30.0 - 36.0 g/dL   RDW 14.2  11.5 - 15.5 %   Platelets 370  150 - 400 K/uL  COMPREHENSIVE METABOLIC PANEL     Status: Abnormal   Collection Time    12/29/13  3:15 PM      Result Value Ref Range   Sodium 140  137 - 147 mEq/L   Potassium 4.1  3.7 - 5.3 mEq/L   Chloride 96  96 - 112 mEq/L   CO2 25  19 - 32 mEq/L   Glucose, Bld 122 (*) 70 - 99 mg/dL   BUN 9  6 - 23 mg/dL   Creatinine, Ser 0.76  0.50 - 1.35 mg/dL   Calcium 10.0  8.4 - 10.5 mg/dL   Total Protein 7.7  6.0 - 8.3 g/dL   Albumin 4.1  3.5 - 5.2 g/dL   AST 49 (*) 0 - 37 U/L   ALT 37  0 - 53 U/L   Alkaline Phosphatase 114  39 - 117 U/L   Total Bilirubin 0.2 (*) 0.3 - 1.2 mg/dL   GFR calc non Af Amer >90  >90 mL/min   GFR calc Af Amer >90  >90 mL/min   Comment: (NOTE)     The eGFR has been calculated using the CKD EPI equation.     This calculation has not been validated in all clinical situations.     eGFR's persistently <90 mL/min signify possible Chronic Kidney     Disease.  ETHANOL     Status: Abnormal   Collection Time    12/29/13  3:15 PM      Result Value Ref Range   Alcohol, Ethyl (B) 61 (*) 0 - 11 mg/dL   Comment:            LOWEST DETECTABLE LIMIT FOR     SERUM ALCOHOL IS 11 mg/dL     FOR MEDICAL PURPOSES ONLY  CARBAMAZEPINE LEVEL, TOTAL     Status: Abnormal   Collection Time    12/29/13  3:15 PM      Result Value Ref Range   Carbamazepine Lvl 3.1 (*) 4.0 - 12.0 ug/mL   Comment: Performed at [facility]    Physical Findings: AIMS: Facial and Oral Movements Muscles of Facial Expression: None, normal Lips and Perioral Area: None, normal Jaw: None, normal Tongue: None, normal,Extremity Movements Upper (arms, wrists, hands, fingers): None, normal Lower (legs, knees, ankles, toes): None, normal, Trunk Movements Neck, shoulders, hips: None, normal, Overall  Severity Severity of abnormal movements (highest score from questions above): None, normal Incapacitation due to abnormal movements: None, normal Patient's awareness of abnormal movements (rate only patient's report): No Awareness, Dental Status Current problems with teeth and/or dentures?: No Does patient usually wear dentures?: No  CIWA:  CIWA-Ar Total: 0 COWS:     Treatment Plan Summary: Daily contact with patient to assess and evaluate symptoms and progress in treatment Medication management, continue detox protocol.  Patient is to have tremors and shakes.  Continue Seroquel, Zoloft and Tegretol.  We will get Tegretol level on Monday.  Patient does  not have any side effects of medication at this time.  Encouraged to participate in group milieu therapy.  Plan:  Medical Decision Making Problem Points:  Established problem, stable/improving (1), Review of last therapy session (1) and Review of psycho-social stressors (1) Data Points:  Review or order clinical lab tests (1) Review and summation of old records (2) Review of medication regiment & side effects (2) Review of new medications or change in dosage (2)  I certify that inpatient services furnished can reasonably be expected to improve the patient's condition.   Michel Hendon T Deeandra Jerry 12/31/2013, 10:41 AM

## 2013-12-31 NOTE — Progress Notes (Addendum)
Patient ID: [name], male   DOB: 1971-01-19, 43 y.o.   MRN: [id number] Psychoeducational Group Note  Date:  12/31/2013 Time:0910am  Group Topic/Focus:  Identifying Needs:   The focus of this group is to help patients identify their personal needs that have been historically problematic and identify healthy behaviors to address their needs.  Participation Level:  Active  Participation Quality:  Appropriate  Affect:  Appropriate  Cognitive:  Appropriate  Insight:  Supportive  Engagement in Group:  Supportive  Additional Comments:  Healthy coping skills.   Pricilla Larsson 12/31/2013,10:24 AM

## 2013-12-31 NOTE — [facility] Group Notes (Signed)
[facility] LCSW Group Therapy Note  12/31/2013 /1:15 PM  Type of Therapy and Topic:  Group Therapy: Avoiding Self-Sabotaging and Enabling Behaviors  Participation Level:  Active   Mood: depressed  Description of Group:     Learn how to identify obstacles, self-sabotaging and enabling behaviors, what are they, why do we do them and what needs do these behaviors meet? Discuss unhealthy relationships and how to have positive healthy boundaries with those that sabotage and enable. Explore aspects of self-sabotage and enabling in yourself and how to limit these self-destructive behaviors in everyday life.  Therapeutic Goals: 1. Patient will identify one obstacle that relates to self-sabotage and enabling behaviors 2. Patient will identify one personal self-sabotaging or enabling behavior they did prior to admission 3. Patient able to establish a plan to change the above identified behavior they did prior to admission:  4. Patient will demonstrate ability to communicate their needs through discussion and/or role plays.   Summary of Patient Progress: The main focus of today's process group was to explain what "self-sabotage" means and use Motivational Interviewing to discuss what benefits, negative or positive, were involved in a self-identified self-sabotaging behavior. We then talked about reasons the patient may want to change the behavior and her current desire to change. A scaling question was used to help patient look at where they are now in motivation for change, from 1 to 10 (lowest to highest motivation).  [name] shared about his frequent periods of isolation over his lifetime and recent month wherein he mostly stayed in or near bedroom, not leaving the residence. He admits that his families confronting him with need for help and actually making the calls to crisis hotline were most helpful. Patient reports he is looking forward to December debut of new [employer] Movie.     Therapeutic  Modalities:   Cognitive Behavioral Therapy Person-Centered Therapy Motivational Interviewing   [name], LCSW

## 2013-12-31 NOTE — Progress Notes (Signed)
Patient ID: [name], male   DOB: 01/10/1971, 43 y.o.   MRN: [id number] 12-31-13 nursing shift note: D: this pt continues to have auditory hallucinations and some magical thinking.  pt was due a dressing change on his left hand that has 2 pins in it. A: the wound was unremarkable and the dressing change was completed. His ciwa was 4 at 1200 noon. His pain issues have been addressed with ibuprofen prn achieving his pain goal of 5. R: he continues to have passive SI, but is able to contract verbally. He denies any hi. On his inventory sheet he wrote: slept fair, appetite improving, energy normal, attention poor with his depression at 7 and hopelessness at 4. W/d symptoms are tremors.after discharge he plans to " ask for help before things get out of hand. Make sure I take med's how prescribed".  RN will monitor and Q 15 min ck's continue.

## 2014-01-01 MED ORDER — ZIPRASIDONE MESYLATE 20 MG IM SOLR
INTRAMUSCULAR | Status: AC
  Administered 2014-01-01: 20 mg via INTRAMUSCULAR
  Filled 2014-01-01: qty 20

## 2014-01-01 MED ORDER — ZIPRASIDONE MESYLATE 20 MG IM SOLR
20.0000 mg | Freq: Two times a day (BID) | INTRAMUSCULAR | Status: DC | PRN
  Administered 2014-01-04: 20 mg via INTRAMUSCULAR
  Filled 2014-01-01: qty 20

## 2014-01-01 NOTE — Progress Notes (Signed)
D. Pt has been up and has been active in milieu this evening, did attend and participate in evening wrap-up group. Pt spoke about how he had a rough time earlier today but has gotten better. Pt spoke about how he was having auditory hallucinations earlier in the day and that he required a "shot of geodon" to help him. Pt does not endorse any withdrawal symptoms and there are no overt signs or symptoms present. Pt hand was cleaned and re-wrapped this evening. A. Support and encouragement provided, medication education given. R. Pt verbalized understanding, safety maintained.

## 2014-01-01 NOTE — B[facility] Group Notes (Signed)
[place] LCSW Group Therapy Note   01/01/2014  11:15 AM to 11:45 AM  Type of Therapy and Topic: Group Therapy: Feelings Around Returning Home & Establishing a Supportive Framework and Activity to Identify signs of Improvement or Decompensation   Participation Level: Appropriate  Mood: Anxious  Description of Group:  Patients first processed thoughts and feelings about up coming discharge. These included fears of upcoming changes, lack of change, new living environments, judgements and expectations from others and overall stigma of MH issues. We then discussed what is a supportive framework? What does it look like feel like and how do I discern it from and unhealthy non-supportive network? Learn how to cope when supports are not helpful and don't support you. Discuss what to do when your family/friends are not supportive.   Therapeutic Goals Addressed in Processing Group:  1. Patient will identify one healthy supportive network that they can use at discharge. 2. Patient will identify one factor of a supportive framework and how to tell it from an unhealthy network. 3. Patient able to identify one coping skill to use when they do not have positive supports from others. 4. Patient will demonstrate ability to communicate their needs through discussion and/or role plays.  Summary of Patient Progress:  Pt engaged during group session as other patients processed their anxiety about discharge and described healthy supports. [name] shared that he counts his Mom, sister and brother as supports even though they cannot always accept or understand his symptoms. He shared that he plans to contact [facility] upon discharge and arrange to go to support groups there although he is apprehensive about doing so. He reports the nurses and techs have been most helpful here yet he attributes "just being around others" as a major source of strength/improvement for him.  [name], LCSW

## 2014-01-01 NOTE — Progress Notes (Signed)
Patient ID: [name], male   DOB: 10-Jan-1971, 42 y.o.   MRN: [id number] Psychoeducational Group Note  Date:  01/01/2014 Time:  0930am  Group Topic/Focus:  Making Healthy Choices:   The focus of this group is to help patients identify negative/unhealthy choices they were using prior to admission and identify positive/healthier coping strategies to replace them upon discharge.  Participation Level:  Active  Participation Quality:  Appropriate  Affect:  Depressed, Irritable and Labile  Cognitive:  Appropriate  Insight:  Supportive  Engagement in Group:  Supportive  Additional Comments:  Healthy support systems.   Pricilla Larsson 01/01/2014,2:18 PM

## 2014-01-01 NOTE — Progress Notes (Signed)
Patient ID: [name], male   DOB: 09/23/1970, 43 y.o.   MRN: [id number] Psychoeducational Group Note  Date:  01/01/2014 Time:  0910am  Group Topic/Focus:  Making Healthy Choices:   The focus of this group is to help patients identify negative/unhealthy choices they were using prior to admission and identify positive/healthier coping strategies to replace them upon discharge.  Participation Level:  Active  Participation Quality:  Appropriate  Affect:  Anxious, Depressed, Irritable and Labile  Cognitive:  Hallucinating  Insight:  Supportive  Engagement in Group:  Supportive  Additional Comments:  Inventory group   Pricilla Larsson 01/01/2014,2:17 PM

## 2014-01-01 NOTE — Progress Notes (Signed)
[place] Group Notes:  (Nursing/MHT/Case Management/Adjunct)  Date:  01/01/2014  Time:  9:20 PM  Type of Therapy:  Psychoeducational Skills  Participation Level:  Active  Participation Quality:  Appropriate  Affect:  Anxious  Cognitive:  Appropriate  Insight:  Improving  Engagement in Group:  Improving  Modes of Intervention:  Education  Summary of Progress/Problems: The patient stated in group this evening that his day was both good and bad. The patient explained that he was upset after sitting down with his doctor since he didn't feel that the doctor spent enough time with him. He states that he felt better after having a long talk with his nurse Rolena Infante W.) in which he was given a chance to "vent". As a goal for tomorrow, he intends to try to stay on an "even keel".    Gennette Pac 01/01/2014, 9:20 PM

## 2014-01-01 NOTE — Progress Notes (Signed)
[facility] MD Progress Note  01/01/2014 10:47 AM [name]  MRN:  [id number] Subjective:  I'm feeling better.  I still feel depressed and sometimes hearing voices and seeing things"  Objective Patient seen chart.  The patient is taking his medication and he has shown some improvement from the past.  He is less depressed and less anxious but continued to endorse nightmares and flashback.  He still have tremors and shakes.  He still have depressive symptoms and some time passive suicidal thoughts.  He is very upset because his UDS shows for amphetamines.  Patient told he has not taken amphetamines in a while.  The patient goes to group but he has limited participation.  He is still irritable, paranoid and guarded sometime but overall he feels improvement.  Axis I: Alcohol Abuse, Post Traumatic Stress Disorder and Schizoaffective Disorder Axis II: Deferred Axis III:  Past Medical History  Diagnosis Date  . Depression   . Psychosis   . PTSD (post-traumatic stress disorder)   . Seizure disorder     related to etoh seizure  . Alcohol abuse   . Schizoaffective disorder   . Anxiety    Axis IV: economic problems, other psychosocial or environmental problems and problems related to social environment Axis V: 21-30 behavior considerably influenced by delusions or hallucinations OR serious impairment in judgment, communication OR inability to function in almost all areas  ADL's:  Intact  Sleep: Fair  Appetite:  Fair  Suicidal Ideation:  Plan:  Patient is to have suicidal thoughts but no plan Homicidal Ideation:  Plan:  Patient denies and homicidal thoughts. AEB (as evidenced by):  Psychiatric Specialty Exam: Physical Exam  Review of Systems  Psychiatric/Behavioral: Positive for depression, suicidal ideas and hallucinations. The patient is nervous/anxious and has insomnia.     Blood pressure 125/78, pulse 88, temperature 97.5 F (36.4 C), temperature source Oral, resp. rate 16, height 5'  9" (1.753 m), weight 200 lb (90.719 kg), SpO2 97.00%.Body mass index is 29.52 kg/(m^2).  General Appearance: Casual and Guarded  Eye Contact::  Fair  Speech:  Slow  Volume:  Decreased  Mood:  Anxious, Depressed, Dysphoric and Irritable  Affect:  Constricted and Depressed  Thought Process:  Loose  Orientation:  Full (Time, Place, and Person)  Thought Content:  Hallucinations: Auditory and Paranoid Ideation  Suicidal Thoughts:  Yes.  without intent/plan  Homicidal Thoughts:  No  Memory:  Immediate;   Fair Recent;   Fair Remote;   Fair  Judgement:  Impaired  Insight:  Shallow  Psychomotor Activity:  Decreased and Tremor  Concentration:  Fair  Recall:  [place]: Fair  Akathisia:  No  Handed:  Right  AIMS (if indicated):     Assets:  Communication Skills Desire for Improvement  Sleep:  Number of Hours: 6.25   Musculoskeletal: Strength & Muscle Tone: within normal limits Gait & Station: normal Patient leans: N/A  Current Medications: Current Facility-Administered Medications  Medication Dose Route Frequency Provider Last Rate Last Dose  . acetaminophen (TYLENOL) tablet 650 mg  650 mg Oral Q6H PRN Malena Peer, NP      . alum & mag hydroxide-simeth (MAALOX/MYLANTA) 200-200-20 MG/5ML suspension 30 mL  30 mL Oral Q4H PRN Malena Peer, NP   30 mL at 01/01/14 0903  . carbamazepine (TEGRETOL XR) 12 hr tablet 200 mg  200 mg Oral BID Mojeed Akintayo   200 mg at 01/01/14 0733  . chlordiazePOXIDE (LIBRIUM) capsule 25 mg  25 mg Oral Q6H PRN Malena Peer, NP   25 mg at 12/29/13 2232  . chlordiazePOXIDE (LIBRIUM) capsule 25 mg  25 mg Oral BH-qamhs Lurena Nida, NP   25 mg at 01/01/14 1245   Followed by  . [START ON 01/02/2014] chlordiazePOXIDE (LIBRIUM) capsule 25 mg  25 mg Oral Daily Lurena Nida, NP      . hydrogen peroxide 3 % external solution   Topical BID Mojeed Akintayo      . hydrOXYzine (ATARAX/VISTARIL) tablet 25 mg  25 mg Oral  Q6H PRN Malena Peer, NP   25 mg at 12/30/13 1435  . ibuprofen (ADVIL,MOTRIN) tablet 600 mg  600 mg Oral Q6H PRN Benjamine Mola, FNP   600 mg at 01/01/14 8099  . loperamide (IMODIUM) capsule 2-4 mg  2-4 mg Oral PRN Malena Peer, NP   4 mg at 12/30/13 1655  . magnesium hydroxide (MILK OF MAGNESIA) suspension 30 mL  30 mL Oral Daily PRN Evanna Glenda Chroman, NP      . multivitamin with minerals tablet 1 tablet  1 tablet Oral Daily Evanna Glenda Chroman, NP   1 tablet at 01/01/14 0734  . nicotine (NICODERM CQ - dosed in mg/24 hours) patch 21 mg  21 mg Transdermal Daily Mojeed Akintayo   21 mg at 01/01/14 0734  . ondansetron (ZOFRAN-ODT) disintegrating tablet 4 mg  4 mg Oral Q6H PRN Lurena Nida, NP      . prazosin (MINIPRESS) capsule 2 mg  2 mg Oral QHS Mojeed Akintayo   2 mg at 12/31/13 2108  . QUEtiapine (SEROQUEL) tablet 200 mg  200 mg Oral Q breakfast Benjamine Mola, FNP   200 mg at 01/01/14 0735  . QUEtiapine (SEROQUEL) tablet 600 mg  600 mg Oral QHS Benjamine Mola, FNP   600 mg at 12/31/13 2108  . sertraline (ZOLOFT) tablet 200 mg  200 mg Oral Daily Mojeed Akintayo   200 mg at 01/01/14 0734  . thiamine (VITAMIN B-1) tablet 100 mg  100 mg Oral Daily Evanna Glenda Chroman, NP   100 mg at 01/01/14 8338    Lab Results:  No results found for this or any previous visit (from the past 48 hour(s)).  Physical Findings: AIMS: Facial and Oral Movements Muscles of Facial Expression: None, normal Lips and Perioral Area: None, normal Jaw: None, normal Tongue: None, normal,Extremity Movements Upper (arms, wrists, hands, fingers): None, normal Lower (legs, knees, ankles, toes): None, normal, Trunk Movements Neck, shoulders, hips: None, normal, Overall Severity Severity of abnormal movements (highest score from questions above): None, normal Incapacitation due to abnormal movements: None, normal Patient's awareness of abnormal movements (rate only patient's report): No Awareness, Dental  Status Current problems with teeth and/or dentures?: No Does patient usually wear dentures?: No  CIWA:  CIWA-Ar Total: 6 COWS:     Plan: 1  Admit for crisis management and stabilization. 2.  Medication management to reduce symptoms to baseline and improved the patient's overall level of functioning.  Closely monitor the side effects, efficacy and therapeutic response of medication.continue detox protocol.  Patient still have tremors and shakes.  Continue Seroquel, Zoloft and Tegretol.  We will get Tegretol level on Monday.  Patient does not have any side effects of medication at this time.   3.  Treat health problem as indicated. 4.  Developed treatment plan to decrease the risk of relapse upon discharge and to reduce the need for readmission. 5.  Psychosocial education regarding  relapse prevention in self-care. 6.  Healthcare followup as needed for medical problems and called consults as indicated.   7.  Increase collateral information. 8.  Restart home medication where appropriate 9. Encouraged to participate and verbalize into group milieu therapy.  Medical Decision Making Problem Points:  Established problem, stable/improving (1), Review of last therapy session (1) and Review of psycho-social stressors (1) Data Points:  Review or order clinical lab tests (1) Review and summation of old records (2) Review of medication regiment & side effects (2) Review of new medications or change in dosage (2)  I certify that inpatient services furnished can reasonably be expected to improve the patient's condition.   [name] 01/01/2014, 10:47 AM

## 2014-01-01 NOTE — Progress Notes (Signed)
Patient ID: [name], male   DOB: 11/20/1970, 43 y.o.   MRN: [id number] 01-01-2014 nursing shift note: D: pt has had many questions and concerns today. He is extremely anxious and worried., as well as having passive SI. He contracted for the SI. He denied any hi. He is positive for auditory/visual hallucinations. He stated he was seeing "shadow people" and "hearing voices telling him to do bad things to himself". A: RN solicited the MD and got this pt some Geodon 20 IM and administered the injection. R: after sleeping for a while pt awakened and stated he was refreshed. R: on his inventory sheet he wrote: slept well, appetite poor, energy normal, attention poor with his depression and hopelessness both at 5. W/d symptoms have been tremors and agitation. After discharge he plans to " go to a support group for schizoaffective disorder. RN will monitor and Q 15 min ck's continue.

## 2014-01-01 NOTE — Progress Notes (Signed)
D: Pt observed sleeping at this time. Resp are even and unlabored. Pt do not appear to be in distress.  A:: 15 minute checks performed for safety.  R: Pt safety maintained.

## 2014-01-02 LAB — CARBAMAZEPINE LEVEL, TOTAL: Carbamazepine Lvl: 5.3 ug/mL (ref 4.0–12.0)

## 2014-01-02 MED ORDER — ZIPRASIDONE HCL 40 MG PO CAPS
40.0000 mg | ORAL_CAPSULE | Freq: Two times a day (BID) | ORAL | Status: DC
  Administered 2014-01-02 – 2014-01-03 (×2): 40 mg via ORAL
  Filled 2014-01-02 (×4): qty 1

## 2014-01-02 MED ORDER — DOXEPIN HCL 25 MG PO CAPS
25.0000 mg | ORAL_CAPSULE | Freq: Every evening | ORAL | Status: DC | PRN
Start: 2014-01-02 — End: 2014-01-04
  Administered 2014-01-02 – 2014-01-03 (×3): 25 mg via ORAL
  Filled 2014-01-02 (×10): qty 1

## 2014-01-02 MED ORDER — DOXYCYCLINE HYCLATE 100 MG PO TABS
100.0000 mg | ORAL_TABLET | Freq: Two times a day (BID) | ORAL | Status: DC
  Administered 2014-01-02 – 2014-01-05 (×6): 100 mg via ORAL
  Filled 2014-01-02 (×10): qty 1

## 2014-01-02 NOTE — BHH Group Notes (Signed)
[facility] LCSW Group Therapy  01/02/2014 1:15 pm  Type of Therapy: Process Group Therapy  Participation Level:  Active  Participation Quality:  Appropriate  Affect:  Flat  Cognitive:  Oriented  Insight:  Improving  Engagement in Group:  Limited  Engagement in Therapy:  Limited  Modes of Intervention:  Activity, Clarification, Education, Problem-solving and Support  Summary of Progress/Problems: Today's group addressed the issue of overcoming obstacles.  Patients were asked to identify their biggest obstacle post d/c that stands in the way of their on-going success, and then problem solve as to how to manage this. [name] stated that his anxiety gets in the way of him doing what he needs to do to stay healthy.  For example, he wants to go to the mental health [facility] for groups, but is already anticipating becoming too anxious to go.  He was able to identify things that have helped him get his anxiety under control in the past, including pacing, playing guitar and writing lyrics.  [name] 01/02/2014   3:34 PM

## 2014-01-02 NOTE — BHH Group Notes (Signed)
[facility] LCSW Aftercare Discharge Planning Group Note   01/02/2014 10:54 AM  Participation Quality:    Mood/Affect:  Flat  Depression Rating:  5  Anxiety Rating:  5  Thoughts of Suicide:  No Will you contract for safety?   NA  Current AVH:  Yes  Plan for Discharge/Comments:  "I did not like the Dr over the weekend.  He basically accused me of abusing amphetamines, which I have not used for years.  He spent 5 minutes with me.  I did not appreciate that.  The shadows were increasing over the weekend.  I got a shot of Geodon that helped."  Encourage him to talk to Dr about change in meds if he would like.  Transportation Means:  bus  Supports: Parachute

## 2014-01-02 NOTE — Progress Notes (Signed)
D: Pt presents anxious this morning. Pt reports hearing voices this morning but would not specify. Pt has passive SI and contracts for safety this morning. Pt reports depression 5/10 and hopeless 3/10. Pt compliant with taking meds and attending groups. No adverse reaction to meds verbalized by pt at this time. A: Medications administered as ordered per MD. Pt dressing changed to lt wrist. Verbal support given. Pt encouraged to attend groups. 15 minute checks performed for safety. R: Pt safety maintained. Pt receptive to tx.

## 2014-01-02 NOTE — Progress Notes (Signed)
Patient ID: [name], male   DOB: November 23, 1970, 43 y.o.   MRN: [id number] D: Pt. "doing a lot better, than when I came in" Pt. Reports "voice a little bit" "suicidal thoughts, but I'm not going to act on them or anything" Pt. Verbally contracts for safety, agrees to notify staff if thought progress to a plan.Pt. Has immobilizer to left two digits, able to move adjacent finger freely, no pain. A: Writer introduced self to client provided emotional support, encourages open communication, reviewed medications and time of administration. Staff encouraged group. R: Pt. Is safe on the unit, attended group.

## 2014-01-02 NOTE — Progress Notes (Signed)
Patient ID: [name], male   DOB: 11-19-1970, 43 y.o.   MRN: [id number] [facility] MD Progress Note  01/02/2014 12:07 PM Rhythm Wigfall  MRN:  [id number] Subjective:  Patient states "The voices were terrible yesterday. They are not as frequent today. I have been feeling under attack by them. They tell me to give up and threaten me. I did not start feeling better until I had to take a shot of Geodon because I was agitated. I feel somewhat calmer now. But the medications just put up a barrier. I believe the shadow people are actually real and are from another world."   Objective Patient is visible on the unit and attending the scheduled groups. He is reporting ongoing symptoms of depression and auditory/visual hallucinations. Patient required IM Geodon yesterday for severe symptoms of psychosis. He is requesting a medication change as he feels Seroquel has stopped working for him. Rates his depression at five today but was at eight upon admission to the hospital. Admits to having suicidal thoughts but denies having a plan. He reports having a follow up appointment on Thursday with the surgeon who operated on his left hand. Nursing staff expressed concern about some erythema and swelling around the pins of surgical site. Called staff at [facility] where patient had surgery. They recommend starting patient on Doxycycline and continue to clean area with peroxide. Patient has been complaint with medications. He is open to discussing his symptoms today. Patient also expresses delusions that his hallucinations are real and that the medications will only help block them some.   Axis I: Alcohol Abuse, Post Traumatic Stress Disorder and Schizoaffective Disorder Axis II: Deferred Axis III:  Past Medical History  Diagnosis Date  . Depression   . Psychosis   . PTSD (post-traumatic stress disorder)   . Seizure disorder     related to etoh seizure  . Alcohol abuse   . Schizoaffective disorder    . Anxiety    Axis IV: economic problems, other psychosocial or environmental problems and problems related to social environment Axis V: 31-40 impairment in reality testing  ADL's:  Intact  Sleep: Good  Appetite:  Fair  Suicidal Ideation:  Plan:  Patient is to have suicidal thoughts but no plan Homicidal Ideation:  Denies AEB (as evidenced by):  Psychiatric Specialty Exam: Physical Exam  Review of Systems  Constitutional: Negative.   HENT: Negative.   Eyes: Negative.   Respiratory: Negative.   Cardiovascular: Negative.   Gastrointestinal: Negative.   Genitourinary: Negative.   Musculoskeletal:       Patient's left hand is in splint from recent surgery on Dec 19, 2013 from patient report.   Skin: Negative.   Psychiatric/Behavioral: Positive for depression, suicidal ideas and hallucinations. The patient is nervous/anxious and has insomnia.     Blood pressure 124/81, pulse 99, temperature 98.2 F (36.8 C), temperature source Oral, resp. rate 20, height 5\' 9"  (1.753 m), weight 90.719 kg (200 lb), SpO2 97.00%.Body mass index is 29.52 kg/(m^2).  General Appearance: Casual and Guarded  Eye Contact::  Fair  Speech:  Slow  Volume:  Decreased  Mood:  Dysphoric  Affect:  Constricted and Depressed  Thought Process:  Loose  Orientation:  Full (Time, Place, and Person)  Thought Content:  Hallucinations: Auditory Visual and Paranoid Ideation  Suicidal Thoughts:  Yes.  without intent/plan  Homicidal Thoughts:  No  Memory:  Immediate;   Fair Recent;   Fair Remote;   Fair  Judgement:  Impaired  Insight:  Shallow  Psychomotor Activity:  Decreased  Concentration:  Fair  Recall:  [place]: Fair  Akathisia:  No  Handed:  Right  AIMS (if indicated):     Assets:  Communication Skills Desire for Improvement Resilience  Sleep:  Number of Hours: 6.75   Musculoskeletal: Strength & Muscle Tone: within normal limits Gait & Station: normal Patient  leans: N/A  Current Medications: Current Facility-Administered Medications  Medication Dose Route Frequency Provider Last Rate Last Dose  . acetaminophen (TYLENOL) tablet 650 mg  650 mg Oral Q6H PRN Malena Peer, NP      . alum & mag hydroxide-simeth (MAALOX/MYLANTA) 200-200-20 MG/5ML suspension 30 mL  30 mL Oral Q4H PRN Malena Peer, NP   30 mL at 01/01/14 0903  . carbamazepine (TEGRETOL XR) 12 hr tablet 200 mg  200 mg Oral BID Zakar Brosch   200 mg at 01/02/14 0739  . hydrogen peroxide 3 % external solution   Topical BID Jessamine Barcia      . ibuprofen (ADVIL,MOTRIN) tablet 600 mg  600 mg Oral Q6H PRN Benjamine Mola, FNP   600 mg at 01/02/14 0617  . magnesium hydroxide (MILK OF MAGNESIA) suspension 30 mL  30 mL Oral Daily PRN Evanna Glenda Chroman, NP      . multivitamin with minerals tablet 1 tablet  1 tablet Oral Daily Evanna Glenda Chroman, NP   1 tablet at 01/02/14 0739  . nicotine (NICODERM CQ - dosed in mg/24 hours) patch 21 mg  21 mg Transdermal Daily Valery Chance   21 mg at 01/02/14 0739  . prazosin (MINIPRESS) capsule 2 mg  2 mg Oral QHS Michaele Amundson   2 mg at 01/01/14 2044  . sertraline (ZOLOFT) tablet 200 mg  200 mg Oral Daily Stefano Trulson   200 mg at 01/02/14 0739  . thiamine (VITAMIN B-1) tablet 100 mg  100 mg Oral Daily Evanna Cori Greig Castilla, NP   100 mg at 01/02/14 0739  . ziprasidone (GEODON) capsule 40 mg  40 mg Oral BID WC Elmarie Shiley, NP      . ziprasidone (GEODON) injection 20 mg  20 mg Intramuscular Q12H PRN Kathlee Nations, MD        Lab Results:  No results found for this or any previous visit (from the past 48 hour(s)).  Physical Findings: AIMS: Facial and Oral Movements Muscles of Facial Expression: None, normal Lips and Perioral Area: None, normal Jaw: None, normal Tongue: None, normal,Extremity Movements Upper (arms, wrists, hands, fingers): None, normal Lower (legs, knees, ankles, toes): None, normal, Trunk Movements Neck, shoulders,  hips: None, normal, Overall Severity Severity of abnormal movements (highest score from questions above): None, normal Incapacitation due to abnormal movements: None, normal Patient's awareness of abnormal movements (rate only patient's report): No Awareness, Dental Status Current problems with teeth and/or dentures?: No Does patient usually wear dentures?: No  CIWA:  CIWA-Ar Total: 3 COWS:     Plan: 1. Continue crisis management and stabilization. 2.  Medication management to reduce symptoms to baseline and improved the patient's overall level of functioning.  Closely monitor the side effects, efficacy and therapeutic response of medication. D/C Seroquel due to ineffectiveness. Start Geodon 40 mg BID for psychosis. Obtain current EKG to evaluate QTc interval. Continue Minipress 2 mg hs for PTSD symptoms, and Zoloft 200 mg for depression.  3.  Treat health problem as indicated. 4.  Developed treatment plan to decrease the risk of relapse  upon discharge and to reduce the need for readmission. 5.  Psychosocial education regarding relapse prevention in self-care. 6.  Healthcare followup as needed for medical problems and called consults as indicated.  Start Doxycycline 100 mg BID to prevent surgical site infection.  7.  Increase collateral information. 8.  Restart home medication where appropriate 9. Encouraged to participate and verbalize into group milieu therapy.  Medical Decision Making Problem Points:  Established problem, worsening (2), Review of last therapy session (1) and Review of psycho-social stressors (1) Data Points:  Review or order clinical lab tests (1) Review of medication regiment & side effects (2) Review of new medications or change in dosage (2)  I certify that inpatient services furnished can reasonably be expected to improve the patient's condition.   Elmarie Shiley NP-C 01/02/2014, 12:07 PM  Patient seen, evaluated and I agree with notes by Nurse Practitioner. Corena Pilgrim, MD

## 2014-01-02 NOTE — Care Management Utilization Note (Signed)
   Per State Regulation 482.30  This chart was reviewed for necessity with respect to the patient's Admission/ Duration of stay.  Next review date: 01/05/14  Wael Maestas Morrison RN, BSN 

## 2014-01-02 NOTE — Progress Notes (Signed)
The focus of this group is to help patients review their daily goal of treatment and discuss progress on daily workbooks. Pt attended the evening group session and responded to all discussion prompts from the [facility]. Pt shared that today was a good day and that he was "hopeful" regarding a new medication he had been started on. Pt said that part of his wellness plan included attending [place]. meetings and requested information on them, which was given to him following group. "The problem is just getting to that first meeting. I feel like if I can get over my anxiety about going there, I'll be able to stick with it." Encouragement offered to Pt to attend meetings. Pt's affect was appropriate.

## 2014-01-03 MED ORDER — ZIPRASIDONE HCL 60 MG PO CAPS
60.0000 mg | ORAL_CAPSULE | Freq: Two times a day (BID) | ORAL | Status: DC
  Administered 2014-01-03 – 2014-01-05 (×4): 60 mg via ORAL
  Filled 2014-01-03: qty 6
  Filled 2014-01-03 (×2): qty 1
  Filled 2014-01-03: qty 6
  Filled 2014-01-03 (×4): qty 1

## 2014-01-03 MED ORDER — PRAZOSIN HCL 5 MG PO CAPS
5.0000 mg | ORAL_CAPSULE | Freq: Every day | ORAL | Status: DC
  Administered 2014-01-03 – 2014-01-04 (×2): 5 mg via ORAL
  Filled 2014-01-03 (×3): qty 1
  Filled 2014-01-03: qty 3

## 2014-01-03 MED ORDER — HYDROXYZINE HCL 25 MG PO TABS
25.0000 mg | ORAL_TABLET | ORAL | Status: DC | PRN
  Administered 2014-01-03 – 2014-01-04 (×3): 25 mg via ORAL
  Filled 2014-01-03 (×3): qty 1

## 2014-01-03 NOTE — Progress Notes (Signed)
Patient ID: [name], male   DOB: Apr 05, 1971, 43 y.o.   MRN: [id number]  [facility] MD Progress Note  01/03/2014 10:31 AM [name]  MRN:  [id number] Subjective:  Patient states " I am still feeling anxious and seeing shadow people.''  Objective Patient is visible on the unit and attending the scheduled groups. He is endorsing decreased anxiety, mood swings, irritability, depression and auditory/visual hallucinations. However, he states that he is still having anxiety, worries, nightmares, racing thoughts and difficulty sleeping. Patient rates his depression at 5/10 and anxiety at 7/10. He is still reporting passive suicidal thoughts but denies having a plan. He reports having a follow up appointment  with the surgeon who operated on his left hand on Thursday at 2pm and hope to be discharged then so that he can make the appointment. Patient has been complaint with medications and denies any adverse reactions.   Axis I: Alcohol Abuse, Post Traumatic Stress Disorder and Schizoaffective Disorder Axis II: Deferred Axis III:  Past Medical History  Diagnosis Date  . Depression   . Psychosis   . PTSD (post-traumatic stress disorder)   . Seizure disorder     related to etoh seizure  . Alcohol abuse   . Schizoaffective disorder   . Anxiety    Axis IV: economic problems, other psychosocial or environmental problems and problems related to social environment Axis V: 40-50 moderate symptoms  ADL's:  Intact  Sleep: Good  Appetite:  Fair  Suicidal Ideation:  Passive Homicidal Ideation:  Denies AEB (as evidenced by):  Psychiatric Specialty Exam: Physical Exam  Review of Systems  Constitutional: Negative.   HENT: Negative.   Eyes: Negative.   Respiratory: Negative.   Cardiovascular: Negative.   Gastrointestinal: Negative.   Genitourinary: Negative.   Musculoskeletal:       Patient's left hand is in splint from recent surgery on Dec 19, 2013 from patient report.   Skin: Negative.    Psychiatric/Behavioral: Positive for depression, suicidal ideas and hallucinations. The patient is nervous/anxious and has insomnia.     Blood pressure 131/91, pulse 83, temperature 97.6 F (36.4 C), temperature source Oral, resp. rate 19, height 5\' 9"  (1.753 m), weight 90.719 kg (200 lb), SpO2 97.00%.Body mass index is 29.52 kg/(m^2).  General Appearance: Casual and Guarded  Eye Contact::  Fair  Speech:  Slow  Volume:  Decreased  Mood:  Dysphoric  Affect:  Constricted and Depressed  Thought Process:  Loose  Orientation:  Full (Time, Place, and Person)  Thought Content:  Hallucinations: Auditory Visual and Paranoid Ideation  Suicidal Thoughts:  Yes.  without intent/plan  Homicidal Thoughts:  No  Memory:  Immediate;   Fair Recent;   Fair Remote;   Fair  Judgement:  Impaired  Insight:  Shallow  Psychomotor Activity:  Decreased  Concentration:  Fair  Recall:  [place]: Fair  Akathisia:  No  Handed:  Right  AIMS (if indicated):     Assets:  Communication Skills Desire for Improvement Resilience  Sleep:  Number of Hours: 4.5   Musculoskeletal: Strength & Muscle Tone: within normal limits Gait & Station: normal Patient leans: N/A  Current Medications: Current Facility-Administered Medications  Medication Dose Route Frequency Provider Last Rate Last Dose  . acetaminophen (TYLENOL) tablet 650 mg  650 mg Oral Q6H PRN Evanna Glenda Chroman, NP      . alum & mag hydroxide-simeth (MAALOX/MYLANTA) 200-200-20 MG/5ML suspension 30 mL  30 mL Oral Q4H PRN Evanna Glenda Chroman, NP  30 mL at 01/02/14 1542  . carbamazepine (TEGRETOL XR) 12 hr tablet 200 mg  200 mg Oral BID Denisha Hoel   200 mg at 01/03/14 0747  . doxepin (SINEQUAN) capsule 25 mg  25 mg Oral QHS,MR X 1 Benjamine Mola, FNP   25 mg at 01/02/14 2359  . doxycycline (VIBRA-TABS) tablet 100 mg  100 mg Oral Q12H Elmarie Shiley, NP   100 mg at 01/03/14 0747  . hydrogen peroxide 3 % external solution    Topical BID Juvenal Umar      . hydrOXYzine (ATARAX/VISTARIL) tablet 25 mg  25 mg Oral Q4H PRN Mekhai Venuto      . ibuprofen (ADVIL,MOTRIN) tablet 600 mg  600 mg Oral Q6H PRN Benjamine Mola, FNP   600 mg at 01/03/14 0451  . magnesium hydroxide (MILK OF MAGNESIA) suspension 30 mL  30 mL Oral Daily PRN Evanna Glenda Chroman, NP      . multivitamin with minerals tablet 1 tablet  1 tablet Oral Daily Evanna Glenda Chroman, NP   1 tablet at 01/03/14 0747  . nicotine (NICODERM CQ - dosed in mg/24 hours) patch 21 mg  21 mg Transdermal Daily Kieffer Blatz   21 mg at 01/03/14 0643  . prazosin (MINIPRESS) capsule 5 mg  5 mg Oral QHS Klee Kolek      . sertraline (ZOLOFT) tablet 200 mg  200 mg Oral Daily Latavius Capizzi   200 mg at 01/03/14 0747  . thiamine (VITAMIN B-1) tablet 100 mg  100 mg Oral Daily Evanna Cori Greig Castilla, NP   100 mg at 01/03/14 0747  . ziprasidone (GEODON) capsule 60 mg  60 mg Oral BID WC Nickolas Chalfin      . ziprasidone (GEODON) injection 20 mg  20 mg Intramuscular Q12H PRN Kathlee Nations, MD        Lab Results:  Results for orders placed during the hospital encounter of 12/29/13 (from the past 48 hour(s))  CARBAMAZEPINE LEVEL, TOTAL     Status: None   Collection Time    01/02/14  7:54 PM      Result Value Ref Range   Carbamazepine Lvl 5.3  4.0 - 12.0 ug/mL   Comment: Performed at [facility]    Physical Findings: AIMS: Facial and Oral Movements Muscles of Facial Expression: None, normal Lips and Perioral Area: None, normal Jaw: None, normal Tongue: None, normal,Extremity Movements Upper (arms, wrists, hands, fingers): None, normal Lower (legs, knees, ankles, toes): None, normal, Trunk Movements Neck, shoulders, hips: None, normal, Overall Severity Severity of abnormal movements (highest score from questions above): None, normal Incapacitation due to abnormal movements: None, normal Patient's awareness of abnormal movements (rate only patient's report): No  Awareness, Dental Status Current problems with teeth and/or dentures?: No Does patient usually wear dentures?: No  CIWA:  CIWA-Ar Total: 0 COWS:     Plan: 1. Continue crisis management and stabilization. 2.  Medication management to reduce symptoms to baseline and improved the patient's overall level of functioning.  Closely monitor the side effects, efficacy and therapeutic response of medication.  -Increase Geodon to 60 mg BID for psychosis. Obtain current EKG to evaluate QTc interval.  -Increase  Minipress to 5mg  hs for PTSD symptoms. -Continue  Zoloft 200 mg for depression/anxiety/PTSD 3.  Treat health problem as indicated. 4.  Developed treatment plan to decrease the risk of relapse upon discharge and to reduce the need for readmission. 5.  Psychosocial education regarding relapse prevention in self-care. 6.  Healthcare followup as needed for medical problems and called consults as indicated.  Continue Doxycycline 100 mg BID to prevent surgical site infection.  7.  Increase collateral information. 8.  Restart home medication where appropriate 9. Encouraged to participate and verbalize into group milieu therapy.  Medical Decision Making Problem Points:  Established problem, improving (1), Review of last therapy session (1) and Review of psycho-social stressors (1) Data Points:  Review or order clinical lab tests (1) Review of medication regiment & side effects (2) Review of new medications or change in dosage (2)  I certify that inpatient services furnished can reasonably be expected to improve the patient's condition.   Corena Pilgrim, MD 01/03/2014, 10:31 AM

## 2014-01-03 NOTE — Progress Notes (Signed)
Adult Psychoeducational Group Note  Date:  01/03/2014 Time:  10:20 PM  Group Topic/Focus:  Goals Group:   The focus of this group is to help patients establish daily goals to achieve during treatment and discuss how the patient can incorporate goal setting into their daily lives to aide in recovery.  Participation Level:  Active  Participation Quality:  Appropriate  Affect:  Appropriate  Cognitive:  Appropriate  Insight: Appropriate  Engagement in Group:  Engaged  Modes of Intervention:  Discussion  Additional Comments:  Pt stated that his day was great and is looking forward to being D.C on thurs.  Olena Leatherwood 01/03/2014, 10:20 PM

## 2014-01-03 NOTE — B[facility] Group Notes (Signed)
[place] LCSW Group Therapy  01/03/2014 , 12:13 PM   Type of Therapy:  Group Therapy  Participation Level:  Active  Participation Quality:  Attentive  Affect:  Appropriate  Cognitive:  Alert  Insight:  Improving  Engagement in Therapy:  Engaged  Modes of Intervention:  Discussion, Exploration and Socialization  Summary of Progress/Problems: Today's group focused on the term Diagnosis.  Participants were asked to define the term, and then pronounce whether it is a negative, positive or neutral term.  [name] talked about diagnosis in terms of "finding out what is wrong."  When the conversation turned to how others react to those with a mental health diagnosis, he was activated, talking about how he has faced discrimination and abandonment by others who have found out that he has a diagnosis.  But he also went on to say that his diagnosis does not define him, and talked about ways of educating others so that they have a better understanding, including making sure that they know his symptoms can be controlled, and he can lead a normal life.  [name] 01/03/2014 , 12:13 PM

## 2014-01-04 MED ORDER — DOXEPIN HCL 50 MG PO CAPS
50.0000 mg | ORAL_CAPSULE | Freq: Every day | ORAL | Status: DC
  Administered 2014-01-04: 50 mg via ORAL
  Filled 2014-01-04 (×2): qty 1
  Filled 2014-01-04: qty 3

## 2014-01-04 NOTE — Progress Notes (Signed)
Patient ID: [name], male   DOB: [birth year], 43 y.o.   MRN: [id number] D: Patient up in the milieu.  He is interacting well with staff and others.  Patient denies AH and states VH are "better."  He is responding well to his medications.  No withdrawal symptoms noted.  Patient has flat affect; states depressive symptoms decreased.  He rates his depression as a 3; hopelessness as a 2.  Patient states he "plans to stay on meds as prescribed and go to support groups."  Patient continues to be passively SI, however, contracts for safety.  He denies HI.  A: Continue to monitor medication management and MD orders.  Safety checks completed every 15 minutes per protocol.  Patient's dressing on left hand changed.  Area around pins clean and dry.  No swelling or discoloration noted.  Patient has minimal pain.  R: patient is receptive to staff; his behavior is appropriate for situation.

## 2014-01-04 NOTE — B[facility] Group Notes (Signed)
[name] [employer] Group Therapy  01/04/2014 , 1:33 PM    Type of Therapy:  Mental Health Association Presentation  Participation Level:  Active  Participation Quality:  Attentive  Affect:  Blunted  Cognitive:  Oriented  Insight:  Limited  Engagement in Therapy:  Engaged  Modes of Intervention:  Discussion, Education and Socialization  Summary of Progress/Problems:  [name] from [place] came to present his recovery story and play the guitar.  Enjoyed the presenter.  Asked multiple questions about the MHA-what the different groups are and how to get involved.  [name] 01/04/2014 , 1:33 PM

## 2014-01-04 NOTE — Progress Notes (Signed)
Patient ID: [name], male   DOB: [birth year], 43 y.o.   MRN: [id number]  [facility] MD Progress Note  01/04/2014 3:03 PM [name]  MRN:  [id number] Subjective:  Patient states "I am having some trouble staying asleep. I'm really not hearing voices as much. They are less frequent. I also have not seen the shadow people as much. I believe that I am responding to the Geodon."  Objective Patient is visible on the unit and attending the scheduled groups. He is endorsing decreased anxiety, mood swings, irritability, depression and auditory/visual hallucinations. Rates his depression at three and feeling hopeless at two. His main concern is not being able to stay asleep at night. Patient is attending groups and interacting well with others. [name] reports feeling relieved that his psychotic symptoms have improved. Patient continues to be complaint with his medications and denies any adverse effects. The patient is scheduled for discharge tomorrow. His follow up appointment with surgeon for his hand is tomorrow at 2 pm.   Axis I: Alcohol Abuse, Post Traumatic Stress Disorder and Schizoaffective Disorder Axis II: Deferred Axis III:  Past Medical History  Diagnosis Date  . Depression   . Psychosis   . PTSD (post-traumatic stress disorder)   . Seizure disorder     related to etoh seizure  . Alcohol abuse   . Schizoaffective disorder   . Anxiety    Axis IV: economic problems, other psychosocial or environmental problems and problems related to social environment Axis V: 51-60  ADL's:  Intact  Sleep: Fair  Appetite:  Good  Suicidal Ideation:  Denies Homicidal Ideation:  Denies AEB (as evidenced by):  Psychiatric Specialty Exam: Physical Exam  Review of Systems  Constitutional: Negative.   HENT: Negative.   Eyes: Negative.   Respiratory: Negative.   Cardiovascular: Negative.   Gastrointestinal: Negative.   Genitourinary: Negative.   Musculoskeletal:       Patient's left hand  is in splint from recent surgery on Dec 19, 2013 from patient report.   Skin: Negative.   Psychiatric/Behavioral: Positive for depression, suicidal ideas and hallucinations. The patient is nervous/anxious and has insomnia.     Blood pressure 124/88, pulse 102, temperature 98.2 F (36.8 C), temperature source Oral, resp. rate 18, height 5\' 9"  (1.753 m), weight 90.719 kg (200 lb), SpO2 97.00%.Body mass index is 29.52 kg/(m^2).  General Appearance: Casual  Eye Contact::  Good  Speech:  Clear and Coherent  Volume:  Decreased  Mood:  Dysphoric  Affect:  Flat  Thought Process:  Goal Directed and Intact  Orientation:  Full (Time, Place, and Person)  Thought Content:  Hallucinations: Auditory Visual  Suicidal Thoughts:  Yes.  without intent/plan  Homicidal Thoughts:  No  Memory:  Immediate;   Fair Recent;   Fair Remote;   Fair  Judgement:  Impaired  Insight:  Shallow  Psychomotor Activity:  Decreased  Concentration:  Fair  Recall:  [place]: Fair  Akathisia:  No  Handed:  Right  AIMS (if indicated):     Assets:  Communication Skills Desire for Improvement Resilience  Sleep:  Number of Hours: 6.75   Musculoskeletal: Strength & Muscle Tone: within normal limits Gait & Station: normal Patient leans: N/A  Current Medications: Current Facility-Administered Medications  Medication Dose Route Frequency Provider Last Rate Last Dose  . acetaminophen (TYLENOL) tablet 650 mg  650 mg Oral Q6H PRN Ryan Glenda Chroman, NP      . alum & mag hydroxide-simeth (MAALOX/MYLANTA)  200-200-20 MG/5ML suspension 30 mL  30 mL Oral Q4H PRN Ryan Peer, NP   30 mL at 01/02/14 1542  . carbamazepine (TEGRETOL XR) 12 hr tablet 200 mg  200 mg Oral BID [name]   200 mg at 01/04/14 0752  . doxepin (SINEQUAN) capsule 25 mg  25 mg Oral QHS,MR X 1 Ryan Mola, FNP   25 mg at 01/03/14 2106  . doxycycline (VIBRA-TABS) tablet 100 mg  100 mg Oral Q12H Ryan Shiley, NP    100 mg at 01/04/14 6144  . hydrogen peroxide 3 % external solution   Topical BID [name]      . hydrOXYzine (ATARAX/VISTARIL) tablet 25 mg  25 mg Oral Q4H PRN [name]   25 mg at 01/04/14 1437  . ibuprofen (ADVIL,MOTRIN) tablet 600 mg  600 mg Oral Q6H PRN Ryan Mola, FNP   600 mg at 01/04/14 1437  . magnesium hydroxide (MILK OF MAGNESIA) suspension 30 mL  30 mL Oral Daily PRN Ryan Glenda Chroman, NP      . multivitamin with minerals tablet 1 tablet  1 tablet Oral Daily Ryan Glenda Chroman, NP   1 tablet at 01/04/14 0752  . nicotine (NICODERM CQ - dosed in mg/24 hours) patch 21 mg  21 mg Transdermal Daily [name]   21 mg at 01/04/14 0752  . prazosin (MINIPRESS) capsule 5 mg  5 mg Oral QHS [name]   5 mg at 01/03/14 2106  . sertraline (ZOLOFT) tablet 200 mg  200 mg Oral Daily [name]   200 mg at 01/04/14 0752  . thiamine (VITAMIN B-1) tablet 100 mg  100 mg Oral Daily Ryan Cori Greig Castilla, NP   100 mg at 01/04/14 3154  . ziprasidone (GEODON) capsule 60 mg  60 mg Oral BID WC [name]   60 mg at 01/04/14 0752  . ziprasidone (GEODON) injection 20 mg  20 mg Intramuscular Q12H PRN Ryan Nations, MD        Lab Results:  Results for orders placed during the hospital encounter of 12/29/13 (from the past 48 hour(s))  CARBAMAZEPINE LEVEL, TOTAL     Status: None   Collection Time    01/02/14  7:54 PM      Result Value Ref Range   Carbamazepine Lvl 5.3  4.0 - 12.0 ug/mL   Comment: Performed at [facility]    Physical Findings: AIMS: Facial and Oral Movements Muscles of Facial Expression: None, normal Lips and Perioral Area: None, normal Jaw: None, normal Tongue: None, normal,Extremity Movements Upper (arms, wrists, hands, fingers): None, normal Lower (legs, knees, ankles, toes): None, normal, Trunk Movements Neck, shoulders, hips: None, normal, Overall Severity Severity of abnormal movements (highest score from questions above): None,  normal Incapacitation due to abnormal movements: None, normal Patient's awareness of abnormal movements (rate only patient's report): No Awareness, Dental Status Current problems with teeth and/or dentures?: No Does patient usually wear dentures?: No  CIWA:  CIWA-Ar Total: 0 COWS:     Plan: 1. Continue crisis management and stabilization. 2.  Medication management to reduce symptoms to baseline and improved the patient's overall level of functioning.  Closely monitor the side effects, efficacy and therapeutic response of medication.  -Continue Geodon to 60 mg BID for psychosis.  -Continue  Minipress to 5mg  hs for PTSD symptoms. -Continue  Zoloft 200 mg for depression/anxiety/PTSD -Increase Doxepin to 50 mg hs for insomnia.  3.  Treat health problem as indicated. 4.  Developed  treatment plan to decrease the risk of relapse upon discharge and to reduce the need for readmission. 5.  Psychosocial education regarding relapse prevention in self-care. 6.  Healthcare followup as needed for medical problems and called consults as indicated.  Continue Doxycycline 100 mg BID to prevent surgical site infection.  7. Anticipate discharge tomorrow.  8.  Restart home medication where appropriate 9. Encouraged to participate and verbalize into group milieu therapy.  Medical Decision Making Problem Points:  Established problem, improving (1), Review of last therapy session (1) and Review of psycho-social stressors (1) Data Points:  Review or order clinical lab tests (1) Review of medication regiment & side effects (2) Review of new medications or change in dosage (2)  I certify that inpatient services furnished can reasonably be expected to improve the patient's condition.   Ryan Shiley, NP-C 01/04/2014, 3:03 PM  Patient seen, evaluated and I agree with notes by Nurse Practitioner. Corena Pilgrim, MD

## 2014-01-04 NOTE — [facility] Suicide Risk Assessment (Signed)
[facility] INPATIENT:  Family/Significant Other Suicide Prevention Education  Suicide Prevention Education:  Contact Attempts: [name], sister, [phone] has been identified by the patient as the family member/significant other with whom the patient will be residing, and identified as the person(s) who will aid the patient in the event of a mental health crisis.  With written consent from the patient, two attempts were made to provide suicide prevention education, prior to and/or following the patient's discharge.  We were unsuccessful in providing suicide prevention education.  A suicide education pamphlet was given to the patient to share with family/significant other.  Date and time of first attempt:01/03/2014  3:25PM Date and time of second attempt:01/03/2014 4:45 PM   [name] 01/04/2014, 3:49 PM

## 2014-01-04 NOTE — Progress Notes (Signed)
The focus of this group is to help patients review their daily goal of treatment and discuss progress on daily workbooks. Pt attended the evening group session and responded to all discussion prompts from the [place]. Pt shared that the highlight of his day was being able to take a nap. "I was just glad I was able to quiet my brain enough to sleep a little." Pt shared that he may be discharging home tomorrow and was hoping to attend that evening's [place] meeting. Encouragement given for Pt to attend by the Writer and his peers. Pt's only additional request from Nursing Staff this evening was to do laundry, which was taken care of following group. Pt's affect was appropriate.

## 2014-01-04 NOTE — [facility] Group Notes (Signed)
[facility] LCSW Aftercare Discharge Planning Group Note   01/04/2014 11:13 AM  Participation Quality:  Engaged  Mood/Affect:  Flat  Depression Rating:  3  Anxiety Rating:  4  Thoughts of Suicide:  No Will you contract for safety?   NA  Current AVH:  Yes  Plan for Discharge/Comments:  [name] is in a good mood today.  He has no new complaints, and is looking forward to leaving tomorrow and getting his hand checked out.  His sister will take him.  He is happy with medication, though he states he is not sleeping as well as he was on the Seroquel.  He also says the [facility] never totally disappear, but they are currently manageable.  Transportation Means: sister  Supports: sister   [name]

## 2014-01-04 NOTE — Tx Team (Signed)
  Interdisciplinary Treatment Plan Update   Date Reviewed:  01/04/2014  Time Reviewed:  8:22 AM  Progress in Treatment:   Attending groups: Yes Participating in groups: Yes Taking medication as prescribed: Yes  Tolerating medication: Yes Family/Significant other contact made: Yes  Patient understands diagnosis: Yes  Discussing patient identified problems/goals with staff: Yes Medical problems stabilized or resolved: Yes Denies suicidal/homicidal ideation: Yes Patient has not harmed self or others: Yes  For review of initial/current patient goals, please see plan of care.  Estimated Length of Stay:  D/C tomorrow  Reason for Continuation of Hospitalization:   New Problems/Goals identified:  N/A  Discharge Plan or Barriers:   return home, follow up outpt  Additional Comments:  Attendees:  Signature: Corena Pilgrim, MD 01/04/2014 8:22 AM   Signature: Ripley Fraise, LCSW 01/04/2014 8:22 AM  Signature: Elmarie Shiley, NP 01/04/2014 8:22 AM  Signature: Mayra Neer, RN 01/04/2014 8:22 AM  Signature: Darrol Angel, RN 01/04/2014 8:22 AM  Signature:  01/04/2014 8:22 AM  Signature:   01/04/2014 8:22 AM  Signature:    Signature:    Signature:    Signature:    Signature:    Signature:      Scribe for Treatment Team:   Ripley Fraise, LCSW  01/04/2014 8:22 AM

## 2014-01-04 NOTE — Progress Notes (Signed)
Patient ID: [name], male   DOB: 1971-06-14, 43 y.o.   MRN: [id number]  Patient resting in bed with eyes closed. No s/s of distress noted at this time. Respirations regular and unlabored.

## 2014-01-05 DIAGNOSIS — F1210 Cannabis abuse, uncomplicated: Secondary | ICD-10-CM

## 2014-01-05 DIAGNOSIS — IMO0002 Reserved for concepts with insufficient information to code with codable children: Secondary | ICD-10-CM | POA: Diagnosis not present

## 2014-01-05 LAB — CARBAMAZEPINE LEVEL, TOTAL: Carbamazepine Lvl: 4.1 ug/mL (ref 4.0–12.0)

## 2014-01-05 MED ORDER — PRAZOSIN HCL 5 MG PO CAPS: 5.0000 mg | ORAL_CAPSULE | Freq: Every day | ORAL | Status: DC

## 2014-01-05 MED ORDER — DOXEPIN HCL 50 MG PO CAPS: 50.0000 mg | ORAL_CAPSULE | Freq: Every day | ORAL | Status: DC

## 2014-01-05 MED ORDER — MAGNESIUM HYDROXIDE 400 MG/5ML PO SUSP
30.0000 mL | Freq: Every day | ORAL | Status: DC | PRN
Start: 2014-01-05 — End: 2014-01-05

## 2014-01-05 MED ORDER — ADULT MULTIVITAMIN W/MINERALS CH: 1.0000 | ORAL_TABLET | Freq: Every day | ORAL | Status: DC

## 2014-01-05 MED ORDER — SERTRALINE HCL 100 MG PO TABS: 200.0000 mg | ORAL_TABLET | Freq: Every day | ORAL | Status: DC

## 2014-01-05 MED ORDER — ZIPRASIDONE HCL 60 MG PO CAPS: 60.0000 mg | ORAL_CAPSULE | Freq: Two times a day (BID) | ORAL | Status: DC

## 2014-01-05 MED ORDER — ALUM & MAG HYDROXIDE-SIMETH 200-200-20 MG/5ML PO SUSP: 30.0000 mL | ORAL | Status: DC | PRN

## 2014-01-05 MED ORDER — CARBAMAZEPINE ER 200 MG PO TB12: 200.0000 mg | ORAL_TABLET | Freq: Two times a day (BID) | ORAL | Status: DC

## 2014-01-05 MED ORDER — ACETAMINOPHEN 325 MG PO TABS: 650.0000 mg | ORAL_TABLET | Freq: Four times a day (QID) | ORAL | Status: DC | PRN

## 2014-01-05 NOTE — Progress Notes (Signed)
Patient ID: [name], male   DOB: 10-Jun-1971, 43 y.o.   MRN: [id number] Patient sleeping; no s/s of distress noted at this time. Respirations regular and unlabored. Q 15 minute safety checks maintained. No needs noted.

## 2014-01-05 NOTE — [facility] Suicide Risk Assessment (Signed)
Demographic Factors:  Male, Caucasian, Low socioeconomic status, Living alone, Unemployed and ondisability for mental illness  Total Time spent with patient: 20 minutes  Psychiatric Specialty Exam: Physical Exam  Psychiatric: He has a normal mood and affect. His speech is normal and behavior is normal. Judgment and thought content normal. Cognition and memory are normal.    Review of Systems  Constitutional: Negative.   HENT: Negative.   Eyes: Negative.   Respiratory: Negative.   Cardiovascular: Negative.   Gastrointestinal: Negative.   Genitourinary: Negative.   Musculoskeletal: Positive for joint pain.  Skin: Negative.   Neurological: Negative.   Endo/Heme/Allergies: Negative.   Psychiatric/Behavioral: Negative.     Blood pressure 127/87, pulse 80, temperature 97.8 F (36.6 C), temperature source Oral, resp. rate 20, height 5\' 9"  (1.753 m), weight 90.719 kg (200 lb), SpO2 97.00%.Body mass index is 29.52 kg/(m^2).  General Appearance: Fairly Groomed  Engineer, water::  Good  Speech:  Clear and Coherent and Normal Rate  Volume:  Normal  Mood:  Euthymic  Affect:  Appropriate  Thought Process:  Goal Directed  Orientation:  Full (Time, Place, and Person)  Thought Content:  Negative  Suicidal Thoughts:  No  Homicidal Thoughts:  No  Memory:  Immediate;   Fair Recent;   Fair Remote;   Fair  Judgement:  Fair  Insight:  Fair  Psychomotor Activity:  Normal  Concentration:  Fair  Recall:  Good  Fund of Knowledge:Good  Language: Good  Akathisia:  Negative  Handed:  Right  AIMS (if indicated):     Assets:  Communication Skills Desire for Improvement Physical Health  Sleep:  Number of Hours: 5.75    Musculoskeletal: Strength & Muscle Tone: within normal limits Gait & Station: normal Patient leans: N/A   Mental Status Per Nursing Assessment::   On Admission:     Current Mental Status by Physician: patient denies suicidal ideation, intent or plan  Loss  Factors: Financial problems/change in socioeconomic status  Historical Factors: NA  Risk Reduction Factors:   Sense of responsibility to family, Positive social support and Positive coping skills or problem solving skills  Continued Clinical Symptoms:  Alcohol/Substance Abuse/Dependencies  Cognitive Features That Contribute To Risk:  Closed-mindedness    Suicide Risk:  Minimal: No identifiable suicidal ideation.  Patients presenting with no risk factors but with morbid ruminations; may be classified as minimal risk based on the severity of the depressive symptoms  Discharge Diagnoses:   AXIS I:  Schizoaffective disorder, depressive type              Chronic Alcohol abuse               Post traumatic stress disorder               Cannabis use disorder AXIS II:  Deferred AXIS III:   Past Medical History  Diagnosis Date  . Seizure disorder     related to etoh seizure   AXIS IV:  other psychosocial or environmental problems and problems related to social environment AXIS V:  61-70 mild symptoms  Plan Of Care/Follow-up recommendations:  Activity:  as tolerated Diet:  healthy Tests:  Tegretol level: 5.3 Other:  patient to keep her after care appointment  Is patient on multiple antipsychotic therapies at discharge:  No   Has Patient had three or more failed trials of antipsychotic monotherapy by history:  No  Recommended Plan for Multiple Antipsychotic Therapies: NA    Corena Pilgrim, MD 01/05/2014, 9:07 AM

## 2014-01-05 NOTE — Discharge Summary (Signed)
Physician Discharge Summary Note  Patient:  [name] is an 43 y.o., male MRN:  [id number] DOB:  1971/05/11 Patient phone:  [phone] (home)  Patient address:   [address] [place] [address],  Total Time spent with patient: 20 minutes  Date of Admission:  12/29/2013 Date of Discharge: 01/05/14  Reason for Admission: Psychosis   Discharge Diagnoses: Principal Problem:   Schizoaffective disorder, depressive type Active Problems:   Chronic alcohol abuse   Post traumatic stress disorder (PTSD)   Alcohol dependence   Cannabis abuse   Psychiatric Specialty Exam: Physical Exam  Psychiatric: He has a normal mood and affect. His speech is normal and behavior is normal. Judgment and thought content normal. Cognition and memory are normal.    Review of Systems  Constitutional: Negative.   HENT: Negative.   Eyes: Negative.   Respiratory: Negative.   Cardiovascular: Negative.   Gastrointestinal: Negative.   Genitourinary: Negative.   Musculoskeletal: Negative.   Skin: Negative.   Neurological: Negative.   Endo/Heme/Allergies: Negative.   Psychiatric/Behavioral: Negative.     Blood pressure 127/87, pulse 80, temperature 97.8 F (36.6 C), temperature source Oral, resp. rate 20, height 5\' 9"  (1.753 m), weight 90.719 kg (200 lb), SpO2 97.00%.Body mass index is 29.52 kg/(m^2).  General Appearance: Fairly Groomed  Engineer, water::  Good  Speech:  Clear and Coherent and Normal Rate  Volume:  Normal  Mood:  Euthymic  Affect:  Appropriate  Thought Process:  Goal Directed  Orientation:  Full (Time, Place, and Person)  Thought Content:  Negative  Suicidal Thoughts:  No  Homicidal Thoughts:  No  Memory:  Immediate;   Fair Recent;   Fair Remote;   Fair  Judgement:  Fair  Insight:  Fair  Psychomotor Activity:  Normal  Concentration:  Fair  Recall:  Good  Fund of Knowledge:Good  Language: Good  Akathisia:  Negative  Handed:  Right  AIMS (if indicated):      Assets:  Communication Skills Desire for Improvement Physical Health  Sleep:  Number of Hours: 5.75    Past Psychiatric History: See H&P Diagnosis:  Hospitalizations:  Outpatient Care:  Substance Abuse Care:  Self-Mutilation:  Suicidal Attempts:  Violent Behaviors:   Musculoskeletal: Strength & Muscle Tone: within normal limits Gait & Station: normal Patient leans: N/A  DSM5:  AXIS I: Schizoaffective disorder, depressive type  Chronic Alcohol abuse  Post traumatic stress disorder  Cannabis use disorder  AXIS II: Deferred  AXIS III:  Past Medical History   Diagnosis  Date   .  Seizure disorder      related to etoh seizure   AXIS IV: other psychosocial or environmental problems and problems related to social environment  AXIS V: 61-70 mild symptoms  Level of Care:  OP  Hospital Course:[name] is a 43 year old male who presented voluntarily to [facility] requesting treatment for alcohol abuse and symptoms of schizoaffective. He reports relapsing on alcohol two weeks ago after injuring his pinky finger to left hand. Patient has been drinking about twelve beers every day. [name] reports that he has continued to take his psychiatric medications but feels that have not been working as well lately. Patient states today during his psychiatric assessment "I hear voices and see things. They are demeaning and tell me to hurt myself. I also see shadow people at night. It is really very terrifying. These symptoms have been going on since I was 21. I have been drinking to cope. I am also  struggling with financial problems. I live alone. I am on Disability. I get bad withdrawal symptoms when I quit drinking like tremors, nausea, and seizures."     [name]'s mental and emotional status was reported by a daily self inventory completed by the patient. Observational and interactive notes were documented each day by the clinical providers, nursing staff, mental health techs, case  managers, and recreational therapists to provide regular updates on this patient's status and progress. Any concerns were identified and addressed. Patient was started on Tegretol XR 200 mg BID for improved mood stability, Doxepin 50 mg hs for insomnia, Geodon 60 mg BID for psychosis, and Minipress 5 mg hs for PTSD. His Seroquel was discontinued due to ineffectiveness.       Improvement was demonstrated by declining numbers on the self assessment, improving vital signs, increased cognition, and improvement in mood, sleep, appetite as well as a reduction in physical symptoms. Patient reported a significant decrease in psychotic symptoms. He reported that he was not seeing the "shadow people" that often by time of discharge and denied suicidal thoughts.      The patient was evaluated and found to be stable enough for discharge and was released to home per the initial plan of treatment.   Mental Status Exam:  For mental status exam please see mental status exam and  suicide risk assessment completed by attending physician prior to discharge.   Significant Diagnostic Studies:  Chem profile, CBC, Tegretol level, UDS  Discharge Vitals:   Blood pressure 127/87, pulse 80, temperature 97.8 F (36.6 C), temperature source Oral, resp. rate 20, height 5\' 9"  (1.753 m), weight 90.719 kg (200 lb), SpO2 97.00%. Body mass index is 29.52 kg/(m^2). Lab Results:   Results for orders placed during the hospital encounter of 12/29/13 (from the past 72 hour(s))  CARBAMAZEPINE LEVEL, TOTAL     Status: None   Collection Time    01/02/14  7:54 PM      Result Value Ref Range   Carbamazepine Lvl 5.3  4.0 - 12.0 ug/mL   Comment: Performed at [facility]    Physical Findings: AIMS: Facial and Oral Movements Muscles of Facial Expression: None, normal Lips and Perioral Area: None, normal Jaw: None, normal Tongue: None, normal,Extremity Movements Upper (arms, wrists, hands, fingers): None, normal Lower (legs,  knees, ankles, toes): None, normal, Trunk Movements Neck, shoulders, hips: None, normal, Overall Severity Severity of abnormal movements (highest score from questions above): None, normal Incapacitation due to abnormal movements: None, normal Patient's awareness of abnormal movements (rate only patient's report): No Awareness, Dental Status Current problems with teeth and/or dentures?: No Does patient usually wear dentures?: No  CIWA:  CIWA-Ar Total: 0 COWS:     Psychiatric Specialty Exam: See Psychiatric Specialty Exam and Suicide Risk Assessment completed by Attending Physician prior to discharge.  Discharge destination:  Home  Is patient on multiple antipsychotic therapies at discharge:  No   Has Patient had three or more failed trials of antipsychotic monotherapy by history:  No  Recommended Plan for Multiple Antipsychotic Therapies: NA     Medication List    STOP taking these medications       carbamazepine 200 MG tablet  Commonly known as:  TEGRETOL  Replaced by:  carbamazepine 200 MG 12 hr tablet     QUEtiapine 200 MG tablet  Commonly known as:  SEROQUEL      TAKE these medications     Indication   carbamazepine 200 MG 12 hr  tablet  Commonly known as:  TEGRETOL XR  Take 1 tablet (200 mg total) by mouth 2 (two) times daily.   Indication:  Alcohol Withdrawal Syndrome, Mood stabilizer     doxepin 50 MG capsule  Commonly known as:  SINEQUAN  Take 1 capsule (50 mg total) by mouth at bedtime.   Indication:  Depression, Insomnia     multivitamin with minerals Tabs tablet  Take 1 tablet by mouth daily. May purchase over the counter.   Indication:  Vitamin Supplementation     prazosin 5 MG capsule  Commonly known as:  MINIPRESS  Take 1 capsule (5 mg total) by mouth at bedtime.   Indication:  Insomnia/PTSD     ranitidine 75 MG tablet  Commonly known as:  ZANTAC  Take 75 mg by mouth daily as needed for heartburn.      sertraline 100 MG tablet  Commonly known  as:  ZOLOFT  Take 2 tablets (200 mg total) by mouth daily.   Indication:  Anxiety Disorder, Major Depressive Disorder, Posttraumatic Stress Disorder     ziprasidone 60 MG capsule  Commonly known as:  GEODON  Take 1 capsule (60 mg total) by mouth 2 (two) times daily with a meal.   Indication:  Psychosis           Follow-up Information   Follow up with [facility]. (Go to the walk-in clinic M-F between 8 and9AM for your hospital follow up appointment)    Contact information:   [place] [phone]      Follow-up recommendations:   Activity: as tolerated  Diet: healthy  Tests: Tegretol level: 5.3  Other: patient to keep her after care appointment   Comments:   Take all your medications as prescribed by your mental healthcare provider.  Report any adverse effects and or reactions from your medicines to your outpatient provider promptly.  Patient is instructed and cautioned to not engage in alcohol and or illegal drug use while on prescription medicines.  In the event of worsening symptoms, patient is instructed to call the crisis hotline, 911 and or go to the nearest ED for appropriate evaluation and treatment of symptoms.  Follow-up with your primary care provider for your other medical issues, concerns and or health care needs.   Total Discharge Time:  Greater than 30 minutes.  Signed: Elmarie Shiley NP-C 01/05/2014, 9:28 AM  Patient seen, evaluated and I agree with notes by Nurse Practitioner. Corena Pilgrim, MD

## 2014-01-05 NOTE — Progress Notes (Signed)
D: Patient discharged home per MD order.  Patient had appointment at 2:00 pm for assessment of his left hand.  Patient received all personal belongings, prescriptions and medication samples.  Patient is to follow up at [facility] for medication management.  Patient denies SI/HI/AVH.  Patient left ambulatory with his sister.

## 2014-01-10 NOTE — Progress Notes (Signed)
Patient Discharge Instructions:  After Visit Summary (AVS):   Faxed to:  01/10/14 Discharge Summary Note:   Faxed to:  01/10/14 Psychiatric Admission Assessment Note:   Faxed to:  01/10/14 Suicide Risk Assessment - Discharge Assessment:   Faxed to:  01/10/14 Faxed/Sent to the Next Level Care provider:  01/10/14 Faxed to [facility], 01/10/2014, 4:06 PM

## 2014-02-02 DIAGNOSIS — IMO0002 Reserved for concepts with insufficient information to code with codable children: Secondary | ICD-10-CM | POA: Diagnosis not present

## 2014-02-08 DIAGNOSIS — R443 Hallucinations, unspecified: Secondary | ICD-10-CM | POA: Diagnosis not present

## 2014-02-08 DIAGNOSIS — F29 Unspecified psychosis not due to a substance or known physiological condition: Secondary | ICD-10-CM | POA: Diagnosis not present

## 2014-02-09 ENCOUNTER — Observation Stay (HOSPITAL_COMMUNITY): Admission: AD | Admit: 2014-02-09 | Payer: Medicare Other | Source: Intra-hospital | Admitting: Psychiatry

## 2014-02-09 ENCOUNTER — Encounter (HOSPITAL_COMMUNITY): Payer: Self-pay | Admitting: Emergency Medicine

## 2014-02-09 ENCOUNTER — Observation Stay (HOSPITAL_COMMUNITY)
Admission: EM | Admit: 2014-02-09 | Discharge: 2014-02-09 | Disposition: A | Discharge status: Home / Self Care | Payer: Medicare Other | Attending: Emergency Medicine | Admitting: Emergency Medicine

## 2014-02-09 ENCOUNTER — Encounter (HOSPITAL_COMMUNITY): Payer: Self-pay | Admitting: *Deleted

## 2014-02-09 ENCOUNTER — Inpatient Hospital Stay (HOSPITAL_COMMUNITY)
Admission: EM | Admit: 2014-02-09 | Discharge: 2014-02-16 | DRG: 885 | Disposition: A | Discharge status: Home / Self Care | Payer: Medicare Other | Source: Intra-hospital | Attending: Nurse Practitioner | Admitting: Nurse Practitioner

## 2014-02-09 DIAGNOSIS — F251 Schizoaffective disorder, depressive type: Secondary | ICD-10-CM | POA: Diagnosis present

## 2014-02-09 DIAGNOSIS — F329 Major depressive disorder, single episode, unspecified: Secondary | ICD-10-CM

## 2014-02-09 DIAGNOSIS — F1020 Alcohol dependence, uncomplicated: Secondary | ICD-10-CM | POA: Diagnosis present

## 2014-02-09 DIAGNOSIS — F411 Generalized anxiety disorder: Secondary | ICD-10-CM | POA: Diagnosis not present

## 2014-02-09 DIAGNOSIS — Z9119 Patient's noncompliance with other medical treatment and regimen: Secondary | ICD-10-CM

## 2014-02-09 DIAGNOSIS — F259 Schizoaffective disorder, unspecified: Secondary | ICD-10-CM | POA: Insufficient documentation

## 2014-02-09 DIAGNOSIS — F4310 Post-traumatic stress disorder, unspecified: Secondary | ICD-10-CM | POA: Insufficient documentation

## 2014-02-09 DIAGNOSIS — F17200 Nicotine dependence, unspecified, uncomplicated: Secondary | ICD-10-CM | POA: Diagnosis not present

## 2014-02-09 DIAGNOSIS — R45851 Suicidal ideations: Secondary | ICD-10-CM

## 2014-02-09 DIAGNOSIS — F1010 Alcohol abuse, uncomplicated: Secondary | ICD-10-CM | POA: Diagnosis present

## 2014-02-09 DIAGNOSIS — F1994 Other psychoactive substance use, unspecified with psychoactive substance-induced mood disorder: Secondary | ICD-10-CM

## 2014-02-09 DIAGNOSIS — R748 Abnormal levels of other serum enzymes: Secondary | ICD-10-CM

## 2014-02-09 DIAGNOSIS — F32A Depression, unspecified: Secondary | ICD-10-CM

## 2014-02-09 DIAGNOSIS — Z91199 Patient's noncompliance with other medical treatment and regimen due to unspecified reason: Secondary | ICD-10-CM

## 2014-02-09 DIAGNOSIS — F3289 Other specified depressive episodes: Secondary | ICD-10-CM | POA: Insufficient documentation

## 2014-02-09 DIAGNOSIS — G4700 Insomnia, unspecified: Secondary | ICD-10-CM | POA: Diagnosis present

## 2014-02-09 DIAGNOSIS — F250 Schizoaffective disorder, bipolar type: Secondary | ICD-10-CM

## 2014-02-09 DIAGNOSIS — G40909 Epilepsy, unspecified, not intractable, without status epilepticus: Secondary | ICD-10-CM | POA: Diagnosis present

## 2014-02-09 DIAGNOSIS — F429 Obsessive-compulsive disorder, unspecified: Secondary | ICD-10-CM | POA: Diagnosis present

## 2014-02-09 DIAGNOSIS — Z79899 Other long term (current) drug therapy: Secondary | ICD-10-CM | POA: Insufficient documentation

## 2014-02-09 DIAGNOSIS — F10988 Alcohol use, unspecified with other alcohol-induced disorder: Secondary | ICD-10-CM | POA: Insufficient documentation

## 2014-02-09 DIAGNOSIS — F10230 Alcohol dependence with withdrawal, uncomplicated: Secondary | ICD-10-CM

## 2014-02-09 DIAGNOSIS — F321 Major depressive disorder, single episode, moderate: Secondary | ICD-10-CM | POA: Diagnosis present

## 2014-02-09 LAB — CBC WITH DIFFERENTIAL/PLATELET
Basophils Absolute: 0 10*3/uL (ref 0.0–0.1)
Basophils Relative: 1 % (ref 0–1)
Eosinophils Absolute: 0 10*3/uL (ref 0.0–0.7)
Eosinophils Relative: 1 % (ref 0–5)
HCT: 46 % (ref 39.0–52.0)
Hemoglobin: 16.6 g/dL (ref 13.0–17.0)
Lymphocytes Relative: 55 % — ABNORMAL HIGH (ref 12–46)
Lymphs Abs: 2 10*3/uL (ref 0.7–4.0)
MCH: 33.4 pg (ref 26.0–34.0)
MCHC: 36.1 g/dL — ABNORMAL HIGH (ref 30.0–36.0)
MCV: 92.6 fL (ref 78.0–100.0)
Monocytes Absolute: 0.5 10*3/uL (ref 0.1–1.0)
Monocytes Relative: 14 % — ABNORMAL HIGH (ref 3–12)
Neutro Abs: 1.1 10*3/uL — ABNORMAL LOW (ref 1.7–7.7)
Neutrophils Relative %: 31 % — ABNORMAL LOW (ref 43–77)
Platelets: 172 10*3/uL (ref 150–400)
RBC: 4.97 MIL/uL (ref 4.22–5.81)
RDW: 14 % (ref 11.5–15.5)
WBC: 3.6 10*3/uL — ABNORMAL LOW (ref 4.0–10.5)

## 2014-02-09 LAB — COMPREHENSIVE METABOLIC PANEL
ALT: 332 U/L — ABNORMAL HIGH (ref 0–53)
AST: 339 U/L — ABNORMAL HIGH (ref 0–37)
Albumin: 4.8 g/dL (ref 3.5–5.2)
Alkaline Phosphatase: 135 U/L — ABNORMAL HIGH (ref 39–117)
BUN: 7 mg/dL (ref 6–23)
CO2: 29 mEq/L (ref 19–32)
Calcium: 9.5 mg/dL (ref 8.4–10.5)
Chloride: 76 mEq/L — ABNORMAL LOW (ref 96–112)
Creatinine, Ser: 0.61 mg/dL (ref 0.50–1.35)
GFR calc Af Amer: >90 mL/min (ref >90)
GFR calc non Af Amer: >90 mL/min (ref >90)
Glucose, Bld: 81 mg/dL (ref 70–99)
Potassium: 2.9 mEq/L — CL (ref 3.7–5.3)
Sodium: 129 mEq/L — ABNORMAL LOW (ref 137–147)
Total Bilirubin: 0.8 mg/dL (ref 0.3–1.2)
Total Protein: 7.9 g/dL (ref 6.0–8.3)

## 2014-02-09 LAB — ETHANOL: Alcohol, Ethyl (B): 226 mg/dL — ABNORMAL HIGH (ref 0–11)

## 2014-02-09 LAB — RAPID URINE DRUG SCREEN, HOSP PERFORMED
Amphetamines: NOT DETECTED
Barbiturates: NOT DETECTED
Benzodiazepines: NOT DETECTED
Cocaine: NOT DETECTED
Opiates: NOT DETECTED
Tetrahydrocannabinol: POSITIVE — AB

## 2014-02-09 MED ORDER — VITAMIN B-1 100 MG PO TABS: 100.0000 mg | ORAL_TABLET | Freq: Every day | ORAL | Status: DC

## 2014-02-09 MED ORDER — THIAMINE HCL 100 MG/ML IJ SOLN: 100.0000 mg | Freq: Every day | INTRAMUSCULAR | Status: DC

## 2014-02-09 MED ORDER — VITAMIN B-1 100 MG PO TABS
100.0000 mg | ORAL_TABLET | Freq: Every day | ORAL | Status: DC
  Administered 2014-02-09: 100 mg via ORAL
  Filled 2014-02-09: qty 1

## 2014-02-09 MED ORDER — ONDANSETRON HCL 4 MG PO TABS: 4.0000 mg | ORAL_TABLET | Freq: Three times a day (TID) | ORAL | Status: DC | PRN

## 2014-02-09 MED ORDER — LORAZEPAM 1 MG PO TABS
1.0000 mg | ORAL_TABLET | Freq: Two times a day (BID) | ORAL | Status: AC
  Administered 2014-02-11 (×2): 1 mg via ORAL
  Filled 2014-02-09 (×2): qty 1

## 2014-02-09 MED ORDER — LORAZEPAM 1 MG PO TABS: 0.0000 mg | ORAL_TABLET | Freq: Two times a day (BID) | ORAL | Status: DC

## 2014-02-09 MED ORDER — IBUPROFEN 200 MG PO TABS: 600.0000 mg | ORAL_TABLET | Freq: Three times a day (TID) | ORAL | Status: DC | PRN

## 2014-02-09 MED ORDER — ADULT MULTIVITAMIN W/MINERALS CH
1.0000 | ORAL_TABLET | Freq: Every day | ORAL | Status: DC
  Administered 2014-02-09 – 2014-02-11 (×3): 1 via ORAL
  Filled 2014-02-09 (×7): qty 1

## 2014-02-09 MED ORDER — ZOLPIDEM TARTRATE 5 MG PO TABS: 5.0000 mg | ORAL_TABLET | Freq: Every evening | ORAL | Status: DC | PRN

## 2014-02-09 MED ORDER — NICOTINE 21 MG/24HR TD PT24
21.0000 mg | MEDICATED_PATCH | Freq: Every day | TRANSDERMAL | Status: DC
  Administered 2014-02-09: 21 mg via TRANSDERMAL
  Filled 2014-02-09: qty 1

## 2014-02-09 MED ORDER — VITAMIN B-1 100 MG PO TABS
100.0000 mg | ORAL_TABLET | Freq: Every day | ORAL | Status: DC
  Administered 2014-02-10 – 2014-02-11 (×2): 100 mg via ORAL
  Filled 2014-02-09 (×5): qty 1

## 2014-02-09 MED ORDER — IBUPROFEN 600 MG PO TABS
600.0000 mg | ORAL_TABLET | Freq: Three times a day (TID) | ORAL | Status: DC | PRN
  Administered 2014-02-09 – 2014-02-10 (×2): 600 mg via ORAL
  Filled 2014-02-09 (×2): qty 1

## 2014-02-09 MED ORDER — NICOTINE 21 MG/24HR TD PT24: 21.0000 mg | MEDICATED_PATCH | Freq: Every day | TRANSDERMAL | Status: DC

## 2014-02-09 MED ORDER — SERTRALINE HCL 50 MG PO TABS: 200.0000 mg | ORAL_TABLET | Freq: Every day | ORAL | Status: DC

## 2014-02-09 MED ORDER — DOXEPIN HCL 50 MG PO CAPS
50.0000 mg | ORAL_CAPSULE | Freq: Every day | ORAL | Status: DC
  Filled 2014-02-09 (×2): qty 1

## 2014-02-09 MED ORDER — PRAZOSIN HCL 5 MG PO CAPS
5.0000 mg | ORAL_CAPSULE | Freq: Every day | ORAL | Status: DC
  Filled 2014-02-09 (×2): qty 1

## 2014-02-09 MED ORDER — DOXEPIN HCL 50 MG PO CAPS
50.0000 mg | ORAL_CAPSULE | Freq: Every evening | ORAL | Status: DC | PRN
  Filled 2014-02-09 (×2): qty 1

## 2014-02-09 MED ORDER — LORAZEPAM 1 MG PO TABS
0.0000 mg | ORAL_TABLET | Freq: Four times a day (QID) | ORAL | Status: DC
  Administered 2014-02-09: 1 mg via ORAL
  Administered 2014-02-09: 2 mg via ORAL
  Filled 2014-02-09: qty 1
  Filled 2014-02-09: qty 2

## 2014-02-09 MED ORDER — HYDROXYZINE HCL 25 MG PO TABS
25.0000 mg | ORAL_TABLET | Freq: Four times a day (QID) | ORAL | Status: DC | PRN
  Administered 2014-02-10: 25 mg via ORAL
  Filled 2014-02-09: qty 1

## 2014-02-09 MED ORDER — LORAZEPAM 1 MG PO TABS
1.0000 mg | ORAL_TABLET | Freq: Four times a day (QID) | ORAL | Status: DC | PRN
  Administered 2014-02-09 – 2014-02-11 (×3): 1 mg via ORAL
  Filled 2014-02-09 (×3): qty 1

## 2014-02-09 MED ORDER — FAMOTIDINE 20 MG PO TABS: 20.0000 mg | ORAL_TABLET | Freq: Every day | ORAL | Status: DC

## 2014-02-09 MED ORDER — ONDANSETRON 4 MG PO TBDP
4.0000 mg | ORAL_TABLET | Freq: Four times a day (QID) | ORAL | Status: DC | PRN
  Administered 2014-02-10 (×2): 4 mg via ORAL
  Filled 2014-02-09 (×2): qty 1

## 2014-02-09 MED ORDER — LORAZEPAM 1 MG PO TABS
1.0000 mg | ORAL_TABLET | Freq: Four times a day (QID) | ORAL | Status: AC
  Administered 2014-02-09 (×2): 1 mg via ORAL
  Filled 2014-02-09 (×2): qty 1

## 2014-02-09 MED ORDER — QUETIAPINE FUMARATE 50 MG PO TABS: 50.0000 mg | ORAL_TABLET | Freq: Two times a day (BID) | ORAL | Status: DC

## 2014-02-09 MED ORDER — CARBAMAZEPINE ER 200 MG PO TB12
200.0000 mg | ORAL_TABLET | Freq: Two times a day (BID) | ORAL | Status: DC
  Filled 2014-02-09: qty 1

## 2014-02-09 MED ORDER — LOPERAMIDE HCL 2 MG PO CAPS
2.0000 mg | ORAL_CAPSULE | ORAL | Status: DC | PRN
Start: 2014-02-09 — End: 2014-02-11
  Administered 2014-02-10: 2 mg via ORAL
  Administered 2014-02-10: 4 mg via ORAL
  Administered 2014-02-11: 2 mg via ORAL
  Filled 2014-02-09: qty 1
  Filled 2014-02-09: qty 2
  Filled 2014-02-09: qty 1

## 2014-02-09 MED ORDER — POTASSIUM CHLORIDE CRYS ER 20 MEQ PO TBCR
20.0000 meq | EXTENDED_RELEASE_TABLET | Freq: Once | ORAL | Status: AC
  Administered 2014-02-09: 20 meq via ORAL
  Filled 2014-02-09: qty 2
  Filled 2014-02-09: qty 1

## 2014-02-09 MED ORDER — ZIPRASIDONE HCL 20 MG PO CAPS
60.0000 mg | ORAL_CAPSULE | Freq: Two times a day (BID) | ORAL | Status: DC
  Administered 2014-02-09: 60 mg via ORAL
  Filled 2014-02-09: qty 3

## 2014-02-09 MED ORDER — LORAZEPAM 1 MG PO TABS
1.0000 mg | ORAL_TABLET | Freq: Three times a day (TID) | ORAL | Status: AC
  Administered 2014-02-10 (×3): 1 mg via ORAL
  Filled 2014-02-09 (×3): qty 1

## 2014-02-09 MED ORDER — NICOTINE 21 MG/24HR TD PT24
21.0000 mg | MEDICATED_PATCH | Freq: Every day | TRANSDERMAL | Status: DC
  Administered 2014-02-10 – 2014-02-11 (×2): 21 mg via TRANSDERMAL
  Filled 2014-02-09 (×5): qty 1

## 2014-02-09 MED ORDER — CARBAMAZEPINE ER 200 MG PO TB12
200.0000 mg | ORAL_TABLET | Freq: Two times a day (BID) | ORAL | Status: DC
  Administered 2014-02-09: 200 mg via ORAL
  Filled 2014-02-09 (×3): qty 1

## 2014-02-09 MED ORDER — LORAZEPAM 1 MG PO TABS: 1.0000 mg | ORAL_TABLET | Freq: Every day | ORAL | Status: DC

## 2014-02-09 MED ORDER — LORAZEPAM 1 MG PO TABS: 0.0000 mg | ORAL_TABLET | Freq: Four times a day (QID) | ORAL | Status: DC

## 2014-02-09 MED ORDER — ZIPRASIDONE HCL 20 MG PO CAPS: 20.0000 mg | ORAL_CAPSULE | Freq: Two times a day (BID) | ORAL | Status: DC

## 2014-02-09 MED ORDER — PRAZOSIN HCL 5 MG PO CAPS
5.0000 mg | ORAL_CAPSULE | Freq: Every day | ORAL | Status: DC
  Filled 2014-02-09: qty 1

## 2014-02-09 MED ORDER — SERTRALINE HCL 50 MG PO TABS
200.0000 mg | ORAL_TABLET | Freq: Every day | ORAL | Status: DC
  Administered 2014-02-09: 200 mg via ORAL
  Filled 2014-02-09: qty 4

## 2014-02-09 NOTE — H&P (Signed)
Psychiatric Admission Assessment Adult  Patient Identification:  [name] Date of Evaluation:  02/09/2014 Chief Complaint:  SCHIZOAFFECTIVE  History of Present Illness:: 43 year old male with schizoaffective disorder presented to [facility] for ETOH intoxication and auditory hallucinations.  He drinks at least 4 large beers daily which according to patient each large beer is the equivalent to a small can of beer.  He reports that he also experiences visual hallucinations as he sees shadows.  He has PTSD as a result of seeing his fiance die in front of him because she overdosed on medication.  He reports a history of seizures approximately 3 years ago, resulting from alcohol withdrawals.  He has passive SI without a plan, states "I don't want to die".        Depression Symptoms:  depressed mood, insomnia, fatigue, feelings of worthlessness/guilt, difficulty concentrating, (Hypo) Manic Symptoms:  Hallucinations, Irritable Mood, Anxiety Symptoms:  Excessive Worry, Psychotic Symptoms:  Hallucinations: Auditory Command:  voices telling him to harm himself  Visual PTSD Symptoms: Re-experiencing:  Flashbacks  Psychiatric Specialty Exam: Physical Exam  Review of Systems  Constitutional: Negative.   Eyes: Negative.   Cardiovascular: Negative.   Gastrointestinal: Positive for abdominal pain.  Genitourinary: Negative.   Musculoskeletal: Negative.   Skin: Negative.   Neurological: Positive for dizziness and headaches.  Psychiatric/Behavioral: Positive for depression, suicidal ideas, hallucinations and substance abuse. The patient is nervous/anxious and has insomnia.     Blood pressure 146/83, pulse 105, temperature 98 F (36.7 C), temperature source Oral, resp. rate 20, height _0  (1.753 m), weight 82.555 kg (182 lb), SpO2 92.00%.Body mass index is 26.86 kg/(m^2).  General Appearance: Fairly Groomed  Engineer, water::  Good  Speech:  Clear and Coherent  Volume:  Normal  Mood:  Depressed   Affect:  Appropriate  Thought Process:  Goal Directed  Orientation:  Full (Time, Place, and Person)  Thought Content:  WDL  Suicidal Thoughts:  No  Homicidal Thoughts:  No  Memory:  Immediate;   Good Recent;   Good Remote;   Good  Judgement:  Intact  Insight:  Fair  Psychomotor Activity:  Negative  Concentration:  Fair  Recall:  Poor  Fund of Knowledge:Fair  Language: Good  Akathisia:  Negative  Handed:  Right  AIMS (if indicated):     Assets:  Communication Skills Desire for Improvement Housing Social Support  Sleep:       Musculoskeletal: Strength & Muscle Tone: within normal limits Gait & Station: normal Patient leans: N/A  Past Psychiatric History: Diagnosis:Alcohol abuse, MDD, Schizoaffective disorder  Hospitalizations:multiple   Outpatient Care: [facility]   Substance Abuse Care: history  Suicidal Attempts: None       Past Medical History:   Past Medical History  Diagnosis Date  . Depression   . Psychosis   . PTSD (post-traumatic stress disorder)   . Seizure disorder     related to etoh seizure  . Alcohol abuse   . Schizoaffective disorder   . Anxiety    Seizure History:  last seizure 3 years ago  Allergies:  No Known Allergies PTA Medications: Prescriptions prior to admission  Medication Sig Dispense Refill  . carbamazepine (TEGRETOL XR) 200 MG 12 hr tablet Take 1 tablet (200 mg total) by mouth 2 (two) times daily.  60 tablet  0  . doxepin (SINEQUAN) 50 MG capsule Take 1 capsule (50 mg total) by mouth at bedtime.  30 capsule  0  . prazosin (MINIPRESS) 5 MG capsule Take 1  capsule (5 mg total) by mouth at bedtime.  30 capsule  0  . ranitidine (ZANTAC) 75 MG tablet Take 75 mg by mouth daily as needed for heartburn.      . sertraline (ZOLOFT) 100 MG tablet Take 2 tablets (200 mg total) by mouth daily.  60 tablet  0    Substance Abuse History in the last 12 months:  Yes.    Consequences of Substance Abuse: Blackouts:    Social History:  reports  that he has been smoking Cigarettes.  He has a 36 pack-year smoking history. He has never used smokeless tobacco. He reports that he drinks about 50.4 ounces of alcohol per week. He reports that he uses illicit drugs (Marijuana) about once per week. Additional Social History:                      Current Place of Residence:   Place of Birth:   Family Members: Marital Status:  Divorced Children:  Sons:  Daughters: Relationships: Education:  Dentist Problems/Performance: Religious Beliefs/Practices: History of Abuse (Emotional/Phsycial/Sexual) Ship broker History:  None. Legal History: Hobbies/Interests:  Family History:  No family history on file.  Results for orders placed during the hospital encounter of 02/09/14 (from the past 72 hour(s))  CBC WITH DIFFERENTIAL     Status: Abnormal   Collection Time    02/09/14  5:00 AM      Result Value Ref Range   WBC 3.6 (*) 4.0 - 10.5 K/uL   RBC 4.97  4.22 - 5.81 MIL/uL   Hemoglobin 16.6  13.0 - 17.0 g/dL   HCT 46.0  39.0 - 52.0 %   MCV 92.6  78.0 - 100.0 fL   MCH 33.4  26.0 - 34.0 pg   MCHC 36.1 (*) 30.0 - 36.0 g/dL   RDW 14.0  11.5 - 15.5 %   Platelets 172  150 - 400 K/uL   Neutrophils Relative % 31 (*) 43 - 77 %   Neutro Abs 1.1 (*) 1.7 - 7.7 K/uL   Lymphocytes Relative 55 (*) 12 - 46 %   Lymphs Abs 2.0  0.7 - 4.0 K/uL   Monocytes Relative 14 (*) 3 - 12 %   Monocytes Absolute 0.5  0.1 - 1.0 K/uL   Eosinophils Relative 1  0 - 5 %   Eosinophils Absolute 0.0  0.0 - 0.7 K/uL   Basophils Relative 1  0 - 1 %   Basophils Absolute 0.0  0.0 - 0.1 K/uL  COMPREHENSIVE METABOLIC PANEL     Status: Abnormal   Collection Time    02/09/14  5:00 AM      Result Value Ref Range   Sodium 129 (*) 137 - 147 mEq/L   Comment: REPEATED TO VERIFY   Potassium 2.9 (*) 3.7 - 5.3 mEq/L   Comment: CRITICAL RESULT CALLED TO, READ BACK BY AND VERIFIED WITH:     ADKINS,L RN AT 2505 06.25.15 BY TIBBITTS,K      REPEATED TO VERIFY   Chloride 76 (*) 96 - 112 mEq/L   Comment: REPEATED TO VERIFY   CO2 29  19 - 32 mEq/L   Comment: REPEATED TO VERIFY   Glucose, Bld 81  70 - 99 mg/dL   BUN 7  6 - 23 mg/dL   Creatinine, Ser 0.61  0.50 - 1.35 mg/dL   Calcium 9.5  8.4 - 10.5 mg/dL   Total Protein 7.9  6.0 - 8.3 g/dL   Albumin 4.8  3.5 - 5.2 g/dL   AST 339 (*) 0 - 37 U/L   ALT 332 (*) 0 - 53 U/L   Alkaline Phosphatase 135 (*) 39 - 117 U/L   Total Bilirubin 0.8  0.3 - 1.2 mg/dL   GFR calc non Af Amer >90  >90 mL/min   GFR calc Af Amer >90  >90 mL/min   Comment: (NOTE)     The eGFR has been calculated using the CKD EPI equation.     This calculation has not been validated in all clinical situations.     eGFR's persistently <90 mL/min signify possible Chronic Kidney     Disease.  ETHANOL     Status: Abnormal   Collection Time    02/09/14  5:00 AM      Result Value Ref Range   Alcohol, Ethyl (B) 226 (*) 0 - 11 mg/dL   Comment:            LOWEST DETECTABLE LIMIT FOR     SERUM ALCOHOL IS 11 mg/dL     FOR MEDICAL PURPOSES ONLY  URINE RAPID DRUG SCREEN (HOSP PERFORMED)     Status: Abnormal   Collection Time    02/09/14  5:17 AM      Result Value Ref Range   Opiates NONE DETECTED  NONE DETECTED   Cocaine NONE DETECTED  NONE DETECTED   Benzodiazepines NONE DETECTED  NONE DETECTED   Amphetamines NONE DETECTED  NONE DETECTED   Tetrahydrocannabinol POSITIVE (*) NONE DETECTED   Barbiturates NONE DETECTED  NONE DETECTED   Comment:            DRUG SCREEN FOR MEDICAL PURPOSES     ONLY.  IF CONFIRMATION IS NEEDED     FOR ANY PURPOSE, NOTIFY LAB     WITHIN 5 DAYS.                LOWEST DETECTABLE LIMITS     FOR URINE DRUG SCREEN     Drug Class       Cutoff (ng/mL)     Amphetamine      1000     Barbiturate      200     Benzodiazepine   196     Tricyclics       222     Opiates          300     Cocaine          300     THC              50   Psychological Evaluations:  Assessment:    DSM5:  Schizophrenia Disorders:  Schizoaffective disorder Trauma-Stressor Disorders:  Posttraumatic Stress Disorder (309.81) Substance/Addictive Disorders:  Alcohol Related Disorder - Severe (303.90) Depressive Disorders:  Major Depressive Disorder (296.99)  AXIS I:  Alcohol abuse, MDD, and Schizoaffective disorder AXIS II:  Deferred AXIS III:   Past Medical History  Diagnosis Date  . Depression   . Psychosis   . PTSD (post-traumatic stress disorder)   . Seizure disorder     related to etoh seizure  . Alcohol abuse   . Schizoaffective disorder   . Anxiety    AXIS IV:  denies AXIS V:  21-30 behavior considerably influenced by delusions or hallucinations OR serious impairment in judgment, communication OR inability to function in almost all areas  Treatment Plan/Recommendations:    1)Admission to observation unit re: ETOH detox and SI without plan 2) Ativan protocol ordered  Current  Medications:  Current Facility-Administered Medications  Medication Dose Route Frequency Graig Hessling Last Rate Last Dose  . hydrOXYzine (ATARAX/VISTARIL) tablet 25 mg  25 mg Oral Q6H PRN Hampton Abbot, MD      . ibuprofen (ADVIL,MOTRIN) tablet 600 mg  600 mg Oral Q8H PRN Benjamine Mola, FNP      . loperamide (IMODIUM) capsule 2-4 mg  2-4 mg Oral PRN Hampton Abbot, MD      . LORazepam (ATIVAN) tablet 1 mg  1 mg Oral QID Hampton Abbot, MD   1 mg at 02/09/14 1717   Followed by  . [START ON 02/10/2014] LORazepam (ATIVAN) tablet 1 mg  1 mg Oral TID Hampton Abbot, MD       Followed by  . [START ON 02/11/2014] LORazepam (ATIVAN) tablet 1 mg  1 mg Oral BID Hampton Abbot, MD       Followed by  . [START ON 02/12/2014] LORazepam (ATIVAN) tablet 1 mg  1 mg Oral Daily Hampton Abbot, MD      . LORazepam (ATIVAN) tablet 1 mg  1 mg Oral Q6H PRN Hampton Abbot, MD   1 mg at 02/09/14 1537  . multivitamin with minerals tablet 1 tablet  1 tablet Oral Daily Hampton Abbot, MD   1 tablet at 02/09/14 1537  . [START ON  02/10/2014] nicotine (NICODERM CQ - dosed in mg/24 hours) patch 21 mg  21 mg Transdermal Daily Benjamine Mola, FNP      . ondansetron (ZOFRAN-ODT) disintegrating tablet 4 mg  4 mg Oral Q6H PRN Hampton Abbot, MD      . Derrill Memo ON 02/10/2014] thiamine (VITAMIN B-1) tablet 100 mg  100 mg Oral Daily Hampton Abbot, MD        Observation Level/Precautions:  Continuous Observation  Laboratory:  Chemistry Profile  Psychotherapy: [facility]   Medications: see medication profile    Consultations: NA    Discharge Concerns: None     Estimated LOS: OBS presently- may require inpatient admission   Other:     Suicide Risk Assessment  Admission Assessment     Nursing information obtained from:  Patient Demographic factors:  Male;Divorced or widowed;Caucasian;Low socioeconomic status;Living alone Current Mental Status:  NA Loss Factors:  NA Historical Factors:  Prior suicide attempts;Family history of mental illness or substance abuse Risk Reduction Factors:  Religious beliefs about death Total Time spent with patient: 30 minutes  CLINICAL FACTORS:   Depression:   Comorbid alcohol abuse/dependence Alcohol/Substance Abuse/Dependencies    Musculoskeletal: Strength & Muscle Tone: within normal limits Gait & Station: normal Patient leans: N/A  COGNITIVE FEATURES THAT CONTRIBUTE TO RISK:  NA  SUICIDE RISK:   Moderate:  Frequent suicidal ideation with limited intensity, and duration, some specificity in terms of plans, no associated intent, good self-control, limited dysphoria/symptomatology, some risk factors present, and identifiable protective factors, including available and accessible social support.  PLAN OF CARE:  I certify that inpatient services furnished can reasonably be expected to improve the patient's condition.   Gypsy Lore CORI 6/25/20155:36 PM

## 2014-02-09 NOTE — H&P (Signed)
Psychiatric supervisory review notes schizoaffective depressed, acute alcohol intoxication with history of abuse, and posttraumatic stress disorder for which treatment with medications are  complex but theoretically sufficient. Passive suicidal ideation may simply accompany alcohol intoxication with no significant withdrawal symptoms yet. I concur with findings, diagnostic considerations, and therapeutic interventions medically necessary in observation unit.  Delight Hoh, MD

## 2014-02-09 NOTE — Consult Note (Cosign Needed)
[name]   Reason for Bautista:  Alcohol Detox, Auditory Hallucinations Referring Physician:  EDP [name] is an 43 y.o. male. Total Time spent with patient: 25 minutes  Assessment: AXIS I:  Alcohol Abuse, Schizoaffective Disorder and Substance Induced Mood Disorder AXIS II:  Deferred AXIS III:   Past Medical History  Diagnosis Date  . Depression   . Psychosis   . PTSD (post-traumatic stress disorder)   . Seizure disorder     related to etoh seizure  . Alcohol abuse   . Schizoaffective disorder   . Anxiety    AXIS IV:  other psychosocial or environmental problems and problems related to social environment AXIS V:  51-60 moderate symptoms  Plan:  Questionable evidence of imminent risk to self or others at present; will determine in OBS UNIT.  Patient does not meet criteria for psychiatric inpatient admission, yet may meet criteria through observation.  Supportive therapy provided about ongoing stressors. Refer to IOP. Discussed crisis plan, support from social network, calling 911, coming to the Emergency Department, and calling Suicide Hotline. Transfer to [facility] OBS Unit for stabilization, resumption of home meds, and evaluation of inpatient vs. outpatient treatment options.  Subjective:   [name] is a 43 y.o. male patient admitted with alcohol intoxication and auditory hallucinations. Pt reports that these voices told him to hurt himself and also told him to stop taking his medications. Pt sees "shadow people since the age of 28". Pt currently denies SI but is unable to definitively contract for safety. Pt denies HI. Pt reports approximately 6-8 "tall beers daily". Pt reports that the recent death of his fiancee was a major stressor in his life contributing to his depression. Pt also reports having taken Geodon historically, but that it was ineffective; states Seroquel is effective. Pt reports that he did not refill his meds due to blackouts.  Initially, pt reported to ED staff that he stopped taking his medication 2 days ago, yet during this assessment, he reported 3-4 weeks ago with abrupt cessation preceding the exacerbation of auditory hallucinations and depression along with decreased appetite and nausea. Pt does report historical suicide attempt (unknown time-frame) by taking an overdose of Ativan and liquor.   HPI:  History provided by the patient. Patient is a 43 year old male with history of anxiety, depression, schizoaffective disorder and alcohol abuse who presents with request for help for alcohol detox. Patient reports daily heavy alcohol use with his last drink around 5 PM. Patient states that he has only been drinking alcohol with poor appetite and he has not taken his prescribed psychiatric medicines since he left [facility] one month ago. He does report associated depression but denies SI or HI. Denies any hallucinations. Denies any drug use.  HPI Elements:   Location:  Generalized, [facility]. Quality:  Worsening. Severity:  Severe. Timing:  Constant. Duration:  Transient. Context:  Exacerbation of underlying alcoholism and depression secondary to increase in life stressors. .  Past Psychiatric History: Past Medical History  Diagnosis Date  . Depression   . Psychosis   . PTSD (post-traumatic stress disorder)   . Seizure disorder     related to etoh seizure  . Alcohol abuse   . Schizoaffective disorder   . Anxiety     reports that he has been smoking Cigarettes.  He has been smoking about 1.50 packs per day. He has never used smokeless tobacco. He reports that he drinks about 50.4 ounces of alcohol per week. He reports that he  uses illicit drugs (Marijuana) about once per week. No family history on file.         Allergies:  No Known Allergies  ACT Assessment Complete:  Yes:    Educational Status    Risk to Self: Risk to self Is patient at risk for suicide?: Yes Substance abuse history and/or treatment for substance  abuse?: Yes  Risk to Others:    Abuse:    Prior Inpatient Therapy:    Prior Outpatient Therapy:    Additional Information:       Objective: Blood pressure 164/91, pulse 96, temperature 97.8 F (36.6 C), temperature source Oral, resp. rate 16, SpO2 98.00%.There is no weight on file to calculate BMI. Results for orders placed during the hospital encounter of 02/09/14 (from the past 72 hour(s))  CBC WITH DIFFERENTIAL     Status: Abnormal   Collection Time    02/09/14  5:00 AM      Result Value Ref Range   WBC 3.6 (*) 4.0 - 10.5 K/uL   RBC 4.97  4.22 - 5.81 MIL/uL   Hemoglobin 16.6  13.0 - 17.0 g/dL   HCT 46.0  39.0 - 52.0 %   MCV 92.6  78.0 - 100.0 fL   MCH 33.4  26.0 - 34.0 pg   MCHC 36.1 (*) 30.0 - 36.0 g/dL   RDW 14.0  11.5 - 15.5 %   Platelets 172  150 - 400 K/uL   Neutrophils Relative % 31 (*) 43 - 77 %   Neutro Abs 1.1 (*) 1.7 - 7.7 K/uL   Lymphocytes Relative 55 (*) 12 - 46 %   Lymphs Abs 2.0  0.7 - 4.0 K/uL   Monocytes Relative 14 (*) 3 - 12 %   Monocytes Absolute 0.5  0.1 - 1.0 K/uL   Eosinophils Relative 1  0 - 5 %   Eosinophils Absolute 0.0  0.0 - 0.7 K/uL   Basophils Relative 1  0 - 1 %   Basophils Absolute 0.0  0.0 - 0.1 K/uL  COMPREHENSIVE METABOLIC PANEL     Status: Abnormal   Collection Time    02/09/14  5:00 AM      Result Value Ref Range   Sodium 129 (*) 137 - 147 mEq/L   Comment: REPEATED TO VERIFY   Potassium 2.9 (*) 3.7 - 5.3 mEq/L   Comment: CRITICAL RESULT CALLED TO, READ BACK BY AND VERIFIED WITH:     ,L RN AT 0625 06.25.15 BY TIBBITTS,K     REPEATED TO VERIFY   Chloride 76 (*) 96 - 112 mEq/L   Comment: REPEATED TO VERIFY   CO2 29  19 - 32 mEq/L   Comment: REPEATED TO VERIFY   Glucose, Bld 81  70 - 99 mg/dL   BUN 7  6 - 23 mg/dL   Creatinine, Ser 0.61  0.50 - 1.35 mg/dL   Calcium 9.5  8.4 - 10.5 mg/dL   Total Protein 7.9  6.0 - 8.3 g/dL   Albumin 4.8  3.5 - 5.2 g/dL   AST 339 (*) 0 - 37 U/L   ALT 332 (*) 0 - 53 U/L   Alkaline  Phosphatase 135 (*) 39 - 117 U/L   Total Bilirubin 0.8  0.3 - 1.2 mg/dL   GFR calc non Af Amer >90  >90 mL/min   GFR calc Af Amer >90  >90 mL/min   Comment: (NOTE)     The eGFR has been calculated using the CKD EPI equation.       This calculation has not been validated in all clinical situations.     eGFR's persistently <90 mL/min signify possible Chronic Kidney     Disease.  ETHANOL     Status: Abnormal   Collection Time    02/09/14  5:00 AM      Result Value Ref Range   Alcohol, Ethyl (B) 226 (*) 0 - 11 mg/dL   Comment:            LOWEST DETECTABLE LIMIT FOR     SERUM ALCOHOL IS 11 mg/dL     FOR MEDICAL PURPOSES ONLY  URINE RAPID DRUG SCREEN (HOSP PERFORMED)     Status: Abnormal   Collection Time    02/09/14  5:17 AM      Result Value Ref Range   Opiates NONE DETECTED  NONE DETECTED   Cocaine NONE DETECTED  NONE DETECTED   Benzodiazepines NONE DETECTED  NONE DETECTED   Amphetamines NONE DETECTED  NONE DETECTED   Tetrahydrocannabinol POSITIVE (*) NONE DETECTED   Barbiturates NONE DETECTED  NONE DETECTED   Comment:            DRUG SCREEN FOR MEDICAL PURPOSES     ONLY.  IF CONFIRMATION IS NEEDED     FOR ANY PURPOSE, NOTIFY LAB     WITHIN 5 DAYS.                LOWEST DETECTABLE LIMITS     FOR URINE DRUG SCREEN     Drug Class       Cutoff (ng/mL)     Amphetamine      1000     Barbiturate      200     Benzodiazepine   947     Tricyclics       076     Opiates          300     Cocaine          300     THC              50   Labs are reviewed and are pertinent for elevated AST/ALT/Alk Phos; Na+ 129, K+ 2.9, THC +, BAL 226.   Current Facility-Administered Medications  Medication Dose Route Frequency Provider Last Rate Last Dose  . carbamazepine (TEGRETOL XR) 12 hr tablet 200 mg  200 mg Oral BID Ruthell Rummage Dammen, PA-C   200 mg at 02/09/14 1518  . doxepin (SINEQUAN) capsule 50 mg  50 mg Oral QHS Ruthell Rummage Dammen, PA-C      . famotidine (PEPCID) tablet 20 mg  20 mg Oral QHS Ruthell Rummage  Dammen, PA-C      . ibuprofen (ADVIL,MOTRIN) tablet 600 mg  600 mg Oral Q8H PRN Martie Lee, PA-C      . LORazepam (ATIVAN) tablet 0-4 mg  0-4 mg Oral 4 times per day Martie Lee, PA-C   2 mg at 02/09/14 3437   Followed by  . [START ON 02/11/2014] LORazepam (ATIVAN) tablet 0-4 mg  0-4 mg Oral Q12H Peter S Dammen, PA-C      . nicotine (NICODERM CQ - dosed in mg/24 hours) patch 21 mg  21 mg Transdermal Daily Ruthell Rummage Dammen, PA-C   21 mg at 02/09/14 0925  . ondansetron (ZOFRAN) tablet 4 mg  4 mg Oral Q8H PRN Ruthell Rummage Dammen, PA-C      . prazosin (MINIPRESS) capsule 5 mg  5 mg Oral QHS Martie Lee, PA-C      .  sertraline (ZOLOFT) tablet 200 mg  200 mg Oral Daily Martie Lee, PA-C   200 mg at 02/09/14 3893  . thiamine (VITAMIN B-1) tablet 100 mg  100 mg Oral Daily Martie Lee, PA-C   100 mg at 02/09/14 7342   Or  . thiamine (B-1) injection 100 mg  100 mg Intravenous Daily Ruthell Rummage Dammen, PA-C      . ziprasidone (GEODON) capsule 60 mg  60 mg Oral BID WC Ruthell Rummage Dammen, PA-C   60 mg at 02/09/14 0800  . zolpidem (AMBIEN) tablet 5 mg  5 mg Oral QHS PRN Martie Lee, PA-C       Current Outpatient Prescriptions  Medication Sig Dispense Refill  . carbamazepine (TEGRETOL XR) 200 MG 12 hr tablet Take 1 tablet (200 mg total) by mouth 2 (two) times daily.  60 tablet  0  . doxepin (SINEQUAN) 50 MG capsule Take 1 capsule (50 mg total) by mouth at bedtime.  30 capsule  0  . prazosin (MINIPRESS) 5 MG capsule Take 1 capsule (5 mg total) by mouth at bedtime.  30 capsule  0  . ranitidine (ZANTAC) 75 MG tablet Take 75 mg by mouth daily as needed for heartburn.      . sertraline (ZOLOFT) 100 MG tablet Take 2 tablets (200 mg total) by mouth daily.  60 tablet  0  . ziprasidone (GEODON) 60 MG capsule Take 1 capsule (60 mg total) by mouth 2 (two) times daily with a meal.  60 capsule  0    Psychiatric Specialty Exam:     Blood pressure 164/91, pulse 96, temperature 97.8 F (36.6 C), temperature source  Oral, resp. rate 16, SpO2 98.00%.There is no weight on file to calculate BMI.  General Appearance: Disheveled  Eye Sport and exercise psychologist::  Fair  Speech:  Clear and Coherent  Volume:  Normal  Mood:  Depressed  Affect:  Appropriate and Depressed  Thought Process:  Coherent and Goal Directed  Orientation:  Full (Time, Place, and Person)  Thought Content:  WDL  Suicidal Thoughts:  No, however, AH commands to harm self; pt unable to ensure safety if discharged at this time  Homicidal Thoughts:  No  Memory:  Immediate;   Fair Recent;   Fair Remote;   Fair  Judgement:  Fair  Insight:  Fair  Psychomotor Activity:  Decreased  Concentration:  Good  Recall:  [place]  Language: Good  Akathisia:  No  Handed:    AIMS (if indicated):     Assets:  Desire for Improvement Financial Resources/Insurance Housing Resilience  Sleep:      Musculoskeletal: Strength & Muscle Tone: decreased Gait & Station: normal Patient leans: N/A  Treatment Plan Summary: Daily contact with patient to assess and evaluate symptoms and progress in treatment Medication management : Discontinue Geodon (taper initiated, will end in 2 doses), Start Seroquel 40m bid for psychosis to titrate up to 1020mbid or higher as necessary.  [facility], resumption of home meds, and evaluation of inpatient vs. outpatient treatment options.  WiBenjamine MolaFNP-BC 02/09/2014 11:17 AM

## 2014-02-09 NOTE — ED Provider Notes (Signed)
Medical screening examination/treatment/procedure(s) were performed by non-physician practitioner and as supervising physician I was immediately available for consultation/collaboration.   EKG Interpretation None       Threasa Beards, MD 02/09/14 2307

## 2014-02-09 NOTE — Progress Notes (Signed)
Patient with a potassium of 2.9 and a sodium level of 129.  AC, Debarah Crape notified as well as Catalina Pizza, FNP.  Per Heloise Purpura, need to notify the OBS unit NP for orders.  Deland Pretty, FNP notified and gave orders for a one time dose of potassium 20 mEq.

## 2014-02-09 NOTE — ED Notes (Signed)
Patient has one bag of belongings in locker 28.

## 2014-02-09 NOTE — BHH Counselor (Signed)
Per Debarah Crape [facility], pt has been accepted to OBS Bed #6. Support paperwork signed and faxed to [facility]. Original placed in pt's chart.  Arnold Long, [place] Assessment Counselor

## 2014-02-09 NOTE — Progress Notes (Signed)
Patient ID: [name], male   DOB: 02-04-1971, 43 y.o.   MRN: [id number]  Pt resting in bed, alert and oriented. Denies SI/HI, -A/[place]. C/o continues h/a and stomachache.see mar.  Will continue to monitor and evaluate for stabilization.

## 2014-02-09 NOTE — ED Notes (Signed)
Psychiatry at bedside.

## 2014-02-09 NOTE — ED Notes (Signed)
Bed: [facility] Expected date:  Expected time:  Means of arrival:  Comments: 

## 2014-02-09 NOTE — Progress Notes (Signed)
[facility] INPATIENT:  Family/Significant Other Suicide Prevention Education  Suicide Prevention Education:  Patient Refusal for Family/Significant Other Suicide Prevention Education: The patient [name] has refused to provide written consent for family/significant other to be provided Family/Significant Other Suicide Prevention Education during admission and/or prior to discharge.  Physician notified.  Salvatore Marvel Mae 02/09/2014, 4:37 PM

## 2014-02-09 NOTE — Progress Notes (Signed)
Patient ID: [name], male   DOB: 1971-07-03, 43 y.o.   MRN: [id number] D:  Patient admitted for observation from [facility].  States he has been drinking daily for the past two weeks, not taking medications and not eating right.  Says he drinks 5 or 6 24 ounce cans of beer per day.  Patient was here as an inpatient approximately one month ago.  He also has a history of schizoaffective disorder, but denies auditory or visual hallucinations at this time.  Also denies suicidal ideation.  A:  Admission to observation unit completed.  Skin assessment complete and unremarkable.  Patient oriented to the unit, given a drink and medications were started.  He was given a PRN lorazepam at approximately 15:30.   R:  Patient cooperative with the admission process.  Verbalized understanding of unit protocols.  Safety checks initiated.

## 2014-02-09 NOTE — ED Notes (Addendum)
TTS informed pt he has bed at [facility]; will be a few hours before the bed is ready.

## 2014-02-09 NOTE — ED Provider Notes (Signed)
CSN: [id number]     Arrival date & time 02/09/14  0007 History   First MD Initiated Contact with Patient 02/09/14 0116     Chief Complaint  Patient presents with  . Medical Clearance   HPI  History provided by the patient. Patient is a 43 year old male with history of anxiety, depression, schizoaffective disorder and alcohol abuse who presents with request for help for alcohol detox. Patient reports daily heavy alcohol use with his last drink around 5 PM. Patient states that he has only been drinking alcohol with poor appetite and he has not taken his prescribed psychiatric medicines since he left [facility] one month ago. He does report associated depression but denies SI or HI. Denies any hallucinations. Denies any drug use.  Past Medical History  Diagnosis Date  . Depression   . Psychosis   . PTSD (post-traumatic stress disorder)   . Seizure disorder     related to etoh seizure  . Alcohol abuse   . Schizoaffective disorder   . Anxiety    Past Surgical History  Procedure Laterality Date  . Foot surgery      right  . Hand surgery     No family history on file. History  Substance Use Topics  . Smoking status: Current Every Day Smoker -- 1.50 packs/day    Types: Cigarettes  . Smokeless tobacco: Never Used  . Alcohol Use: 50.4 oz/week    84 Cans of beer per week    Review of Systems  Constitutional: Negative for fever.  Gastrointestinal: Negative for vomiting and abdominal pain.  All other systems reviewed and are negative.     Allergies  Review of patient's allergies indicates no known allergies.  Home Medications   Prior to Admission medications   Medication Sig Start Date End Date Taking? Authorizing Provider  carbamazepine (TEGRETOL XR) 200 MG 12 hr tablet Take 1 tablet (200 mg total) by mouth 2 (two) times daily. 01/05/14  Yes Elmarie Shiley, NP  doxepin (SINEQUAN) 50 MG capsule Take 1 capsule (50 mg total) by mouth at bedtime. 01/05/14   Elmarie Shiley, NP  prazosin  (MINIPRESS) 5 MG capsule Take 1 capsule (5 mg total) by mouth at bedtime. 01/05/14   Elmarie Shiley, NP  ranitidine (ZANTAC) 75 MG tablet Take 75 mg by mouth daily as needed for heartburn.    Historical Provider, MD  sertraline (ZOLOFT) 100 MG tablet Take 2 tablets (200 mg total) by mouth daily. 01/05/14   Elmarie Shiley, NP  ziprasidone (GEODON) 60 MG capsule Take 1 capsule (60 mg total) by mouth 2 (two) times daily with a meal. 01/05/14   Elmarie Shiley, NP   There were no vitals taken for this visit. Physical Exam  Nursing note and vitals reviewed. Constitutional: He is oriented to person, place, and time. He appears well-developed and well-nourished. No distress.  HENT:  Head: Normocephalic.  Cardiovascular: Normal rate and regular rhythm.   Pulmonary/Chest: Effort normal and breath sounds normal. No respiratory distress. He has no wheezes. He has no rales.  Abdominal: Soft.  Musculoskeletal: Normal range of motion.  Neurological: He is alert and oriented to person, place, and time. He has normal strength. He displays tremor. No cranial nerve deficit or sensory deficit.  Skin: Skin is warm.  Psychiatric: He has a normal mood and affect. His behavior is normal.    ED Course  Procedures   COORDINATION OF CARE:  Nursing notes reviewed. Vital signs reviewed. Initial pt interview and examination performed.  Filed Vitals:   02/09/14 0149  BP: 155/92  Pulse: 101  Temp: 97.9 F (36.6 C)  TempSrc: Oral  Resp: 20  SpO2: 94%    1:42 AM-patient seen and evaluated. Patient appears well in no acute distress. He has slight tremor of upper extremities. Reports last alcoholic drink was at 5 PM. Denies SI or HI.  The patient is medically cleared for further alcohol and addiction treatment.  Psychiatric holding orders including alcohol detox protocol placed. TTS consult placed.   Results for orders placed during the hospital encounter of 02/09/14  CBC WITH DIFFERENTIAL      Result Value Ref  Range   WBC 3.6 (*) 4.0 - 10.5 K/uL   RBC 4.97  4.22 - 5.81 MIL/uL   Hemoglobin 16.6  13.0 - 17.0 g/dL   HCT 46.0  39.0 - 52.0 %   MCV 92.6  78.0 - 100.0 fL   MCH 33.4  26.0 - 34.0 pg   MCHC 36.1 (*) 30.0 - 36.0 g/dL   RDW 14.0  11.5 - 15.5 %   Platelets 172  150 - 400 K/uL   Neutrophils Relative % 31 (*) 43 - 77 %   Neutro Abs 1.1 (*) 1.7 - 7.7 K/uL   Lymphocytes Relative 55 (*) 12 - 46 %   Lymphs Abs 2.0  0.7 - 4.0 K/uL   Monocytes Relative 14 (*) 3 - 12 %   Monocytes Absolute 0.5  0.1 - 1.0 K/uL   Eosinophils Relative 1  0 - 5 %   Eosinophils Absolute 0.0  0.0 - 0.7 K/uL   Basophils Relative 1  0 - 1 %   Basophils Absolute 0.0  0.0 - 0.1 K/uL  COMPREHENSIVE METABOLIC PANEL      Result Value Ref Range   Sodium 129 (*) 137 - 147 mEq/L   Potassium 2.9 (*) 3.7 - 5.3 mEq/L   Chloride 76 (*) 96 - 112 mEq/L   CO2 29  19 - 32 mEq/L   Glucose, Bld 81  70 - 99 mg/dL   BUN 7  6 - 23 mg/dL   Creatinine, Ser 0.61  0.50 - 1.35 mg/dL   Calcium 9.5  8.4 - 10.5 mg/dL   Total Protein 7.9  6.0 - 8.3 g/dL   Albumin 4.8  3.5 - 5.2 g/dL   AST 339 (*) 0 - 37 U/L   ALT 332 (*) 0 - 53 U/L   Alkaline Phosphatase 135 (*) 39 - 117 U/L   Total Bilirubin 0.8  0.3 - 1.2 mg/dL   GFR calc non Af Amer >90  >90 mL/min   GFR calc Af Amer >90  >90 mL/min  ETHANOL      Result Value Ref Range   Alcohol, Ethyl (B) 226 (*) 0 - 11 mg/dL  URINE RAPID DRUG SCREEN (HOSP PERFORMED)      Result Value Ref Range   Opiates NONE DETECTED  NONE DETECTED   Cocaine NONE DETECTED  NONE DETECTED   Benzodiazepines NONE DETECTED  NONE DETECTED   Amphetamines NONE DETECTED  NONE DETECTED   Tetrahydrocannabinol POSITIVE (*) NONE DETECTED   Barbiturates NONE DETECTED  NONE DETECTED      MDM   Final diagnoses:  Alcohol abuse  Depression       Martie Lee, PA-C 02/09/14 2045

## 2014-02-09 NOTE — ED Notes (Addendum)
Pt transported from home by EMS after reporting he stopped taking his psychiatric medication 2 days ago because the voices told him to stop, voices also told him to hurt himself yesterday. Pt now feels he is having withdrawal s/s from stopping meds. A & O. Calm and cooperative  Pt states he stopped taking all psych meds 1 month ago and started drinking etoh heavily. Pt states he is drinking 6-8 24 oz beers daily. Pt states he feels like he is in going into withdrawal. Pt endorses auditory hallucinations since the age of 67 but "not too bad today" pt does have SI occasionally but not recently.

## 2014-02-10 LAB — COMPREHENSIVE METABOLIC PANEL
ALT: 257 U/L — ABNORMAL HIGH (ref 0–53)
AST: 211 U/L — ABNORMAL HIGH (ref 0–37)
Albumin: 4.5 g/dL (ref 3.5–5.2)
Alkaline Phosphatase: 129 U/L — ABNORMAL HIGH (ref 39–117)
BUN: 16 mg/dL (ref 6–23)
CO2: 32 mEq/L (ref 19–32)
Calcium: 10.4 mg/dL (ref 8.4–10.5)
Chloride: 81 mEq/L — ABNORMAL LOW (ref 96–112)
Creatinine, Ser: 0.76 mg/dL (ref 0.50–1.35)
GFR calc Af Amer: >90 mL/min (ref >90)
GFR calc non Af Amer: >90 mL/min (ref >90)
Glucose, Bld: 111 mg/dL — ABNORMAL HIGH (ref 70–99)
Potassium: 3.1 mEq/L — ABNORMAL LOW (ref 3.7–5.3)
Sodium: 132 mEq/L — ABNORMAL LOW (ref 137–147)
Total Bilirubin: 1.3 mg/dL — ABNORMAL HIGH (ref 0.3–1.2)
Total Protein: 7.5 g/dL (ref 6.0–8.3)

## 2014-02-10 LAB — GAMMA GT: GGT: 450 U/L — ABNORMAL HIGH (ref 7–51)

## 2014-02-10 LAB — LIPASE, BLOOD: Lipase: 65 U/L — ABNORMAL HIGH (ref 11–59)

## 2014-02-10 LAB — CK TOTAL AND CKMB (NOT AT ARMC)
CK, MB: <1 ng/mL (ref 0.3–4.0)
Total CK: 121 U/L (ref 7–232)

## 2014-02-10 LAB — MAGNESIUM: Magnesium: 2.3 mg/dL (ref 1.5–2.5)

## 2014-02-10 LAB — CARBAMAZEPINE LEVEL, TOTAL: Carbamazepine Lvl: 3 ug/mL — ABNORMAL LOW (ref 4.0–12.0)

## 2014-02-10 MED ORDER — CARBAMAZEPINE ER 200 MG PO TB12
200.0000 mg | ORAL_TABLET | Freq: Two times a day (BID) | ORAL | Status: DC
  Administered 2014-02-11 (×2): 200 mg via ORAL
  Filled 2014-02-10 (×5): qty 1

## 2014-02-10 MED ORDER — CARBAMAZEPINE ER 200 MG PO TB12
200.0000 mg | ORAL_TABLET | Freq: Once | ORAL | Status: AC
  Administered 2014-02-10: 200 mg via ORAL
  Filled 2014-02-10 (×2): qty 1

## 2014-02-10 MED ORDER — FAMOTIDINE 20 MG PO TABS
20.0000 mg | ORAL_TABLET | Freq: Every day | ORAL | Status: DC
  Administered 2014-02-10 – 2014-02-11 (×2): 20 mg via ORAL
  Filled 2014-02-10 (×5): qty 1

## 2014-02-10 MED ORDER — ATENOLOL 50 MG PO TABS
50.0000 mg | ORAL_TABLET | Freq: Every day | ORAL | Status: DC
  Administered 2014-02-10 – 2014-02-11 (×2): 50 mg via ORAL
  Filled 2014-02-10: qty 2
  Filled 2014-02-10 (×2): qty 1
  Filled 2014-02-10 (×2): qty 2
  Filled 2014-02-10: qty 1
  Filled 2014-02-10: qty 2

## 2014-02-10 NOTE — Progress Notes (Signed)
Patient ID: [name], male   DOB: 22-Sep-1970, 43 y.o.   MRN: [id number] D-States not feeling very well, has a headache, stomach upset and feels weak. Lab drawn this am, results posted and K and NA up but still below normal limits.Denies any thoughts to hurt self now, and feels when his time is up in OBS he will be ready to go home. He called his Mom this am stating she would be worried about him. A-PRN Advil given for c/o H/A.Sleeping now. Ginger ale given with meds for complaint of upset stomach, he had Zofran on night shift, and too soon to repeat it. R-Sleeping now and no further complaints.

## 2014-02-10 NOTE — Progress Notes (Signed)
Patient ID: [name], male   DOB: June 29, 1971, 43 y.o.   MRN: [id number]  Pt with several episodes of diarrhea, PRN Immodium given as ordered. No distress noted at this time.

## 2014-02-10 NOTE — Progress Notes (Signed)
[facility] MD Progress Note  02/10/2014 4:04 PM [name]  MRN:  [id number] Subjective:  Not feeling well Diagnosis:  Alcohol dependence syndrome;Schizoaffective DO;SIMD,PTSD  Schizophrenia Disorders:  Schizophreniform Disorder (295.4) Obsessive-Compulsive Disorders:  na Trauma-Stressor Disorders:  Posttraumatic Stress Disorder (309.81) Substance/Addictive Disorders:  Alcohol Related Disorder - Severe (303.90) Depressive Disorders:  Disruptive Mood Dysregulation Disorder (296.99) Total Time spent with patient: 20 minutes  Axis I: See Diagnosis above Axis II: Deferred Axis III:  Past Medical History  Diagnosis Date  . Depression   . Psychosis   . PTSD (post-traumatic stress disorder)   . Seizure disorder     related to etoh seizure  . Alcohol abuse   . Schizoaffective disorder   . Anxiety    Axis IV: occupational problems and other psychosocial or environmental problems Axis V: 41-50 serious symptoms  ADL's:  Intact  Sleep: Poor  Appetite:  Poor  Suicidal Ideation:  Plan:  none Homicidal Ideation:  Plan:  none AEB :NA  Psychiatric Specialty Exam: Physical Exam  Constitutional: He is oriented to person, place, and time. He appears well-developed. He appears distressed.  HENT:  Head: Normocephalic and atraumatic.  Right Ear: External ear normal.  Left Ear: External ear normal.  Nose: Nose normal.  Eyes: Conjunctivae are normal. Pupils are equal, round, and reactive to light. Right eye exhibits no discharge. Left eye exhibits no discharge.  Neck: Normal range of motion. Neck supple.  Cardiovascular: Normal rate and regular rhythm.   Respiratory: Effort normal and breath sounds normal.  GI: Soft.  Genitourinary:  deferred  Musculoskeletal: Normal range of motion. He exhibits no edema and no tenderness.  Neurological: He is alert and oriented to person, place, and time. No cranial nerve deficit.  Tremor   Skin: He is diaphoretic.  Sweaty  Psychiatric:  See MSE  below    ROS No change from admission ROS  Blood pressure 136/94, pulse 88, temperature 97.4 F (36.3 C), temperature source Oral, resp. rate 16, height 5' 9"  (1.753 m), weight 82.555 kg (182 lb), SpO2 92.00%.Body mass index is 26.86 kg/(m^2).  General Appearance: Disheveled  Eye Sport and exercise psychologist::  Fair  Speech:  Clear and Coherent  Volume:  Decreased  Mood:  Dysphoric  Affect:  Congruent  Thought Process:  Coherent and Goal Directed  Orientation:  Full (Time, Place, and Person)  Thought Content:  WDL  Suicidal Thoughts:  No  Homicidal Thoughts:  No  Memory:  NA  Judgement:  Poor  Insight:  Lacking  Psychomotor Activity:  Restlessness  Concentration:  Poor  Recall:  [place] of Knowledge:Fair  Language: Good  Akathisia:  NA  Handed:  Right  AIMS (if indicated):  na  Assets:  Financial Resources/Insurance Social Support  Sleep:      Musculoskeletal: Strength & Muscle Tone: within normal limits Gait & Station: unsteady Patient leans: Front  Current Medications: Current Facility-Administered Medications  Medication Dose Route Frequency Provider Last Rate Last Dose  . hydrOXYzine (ATARAX/VISTARIL) tablet 25 mg  25 mg Oral Q6H PRN Hampton Abbot, MD      . ibuprofen (ADVIL,MOTRIN) tablet 600 mg  600 mg Oral Q8H PRN Benjamine Mola, FNP   600 mg at 02/10/14 0721  . loperamide (IMODIUM) capsule 2-4 mg  2-4 mg Oral PRN Hampton Abbot, MD      . LORazepam (ATIVAN) tablet 1 mg  1 mg Oral TID Hampton Abbot, MD   1 mg at 02/10/14 1202   Followed by  . [START ON 02/11/2014]  LORazepam (ATIVAN) tablet 1 mg  1 mg Oral BID Hampton Abbot, MD       Followed by  . [START ON 02/12/2014] LORazepam (ATIVAN) tablet 1 mg  1 mg Oral Daily Hampton Abbot, MD      . LORazepam (ATIVAN) tablet 1 mg  1 mg Oral Q6H PRN Hampton Abbot, MD   1 mg at 02/09/14 1537  . multivitamin with minerals tablet 1 tablet  1 tablet Oral Daily Hampton Abbot, MD   1 tablet at 02/10/14 0720  . nicotine (NICODERM CQ - dosed in  mg/24 hours) patch 21 mg  21 mg Transdermal Daily Benjamine Mola, FNP   21 mg at 02/10/14 0720  . ondansetron (ZOFRAN-ODT) disintegrating tablet 4 mg  4 mg Oral Q6H PRN Hampton Abbot, MD   4 mg at 02/10/14 1202  . thiamine (VITAMIN B-1) tablet 100 mg  100 mg Oral Daily Hampton Abbot, MD   100 mg at 02/10/14 0720    Lab Results:  Results for orders placed during the hospital encounter of 02/09/14 (from the past 48 hour(s))  CARBAMAZEPINE LEVEL, TOTAL     Status: Abnormal   Collection Time    02/10/14  6:50 AM      Result Value Ref Range   Carbamazepine Lvl 3.0 (*) 4.0 - 12.0 ug/mL   Comment: Performed at [place] PANEL     Status: Abnormal   Collection Time    02/10/14  6:50 AM      Result Value Ref Range   Sodium 132 (*) 137 - 147 mEq/L   Potassium 3.1 (*) 3.7 - 5.3 mEq/L   Chloride 81 (*) 96 - 112 mEq/L   CO2 32  19 - 32 mEq/L   Glucose, Bld 111 (*) 70 - 99 mg/dL   BUN 16  6 - 23 mg/dL   Creatinine, Ser 0.76  0.50 - 1.35 mg/dL   Calcium 10.4  8.4 - 10.5 mg/dL   Total Protein 7.5  6.0 - 8.3 g/dL   Albumin 4.5  3.5 - 5.2 g/dL   AST 211 (*) 0 - 37 U/L   ALT 257 (*) 0 - 53 U/L   Alkaline Phosphatase 129 (*) 39 - 117 U/L   Total Bilirubin 1.3 (*) 0.3 - 1.2 mg/dL   GFR calc non Af Amer >90  >90 mL/min   GFR calc Af Amer >90  >90 mL/min   Comment: (NOTE)     The eGFR has been calculated using the CKD EPI equation.     This calculation has not been validated in all clinical situations.     eGFR's persistently <90 mL/min signify possible Chronic Kidney     Disease.     Performed at [facility]     Status: Abnormal   Collection Time    02/10/14  6:50 AM      Result Value Ref Range   GGT 450 (*) 7 - 51 U/L   Comment: Performed at [place] [facility]     Status: None   Collection Time    02/10/14  6:50 AM      Result Value Ref Range   Total CK 121  7 - 232 U/L   CK, MB <1.0  0.3 - 4.0 ng/mL    Comment: REPEATED TO VERIFY   Relative Index NOT CALCULATED  0.0 - 2.5   Comment: Performed at [place],  BLOOD     Status: Abnormal   Collection Time    02/10/14  6:50 AM      Result Value Ref Range   Lipase 65 (*) 11 - 59 U/L   Comment: Performed at [place]     Status: None   Collection Time    02/10/14  6:50 AM      Result Value Ref Range   Magnesium 2.3  1.5 - 2.5 mg/dL   Comment: Performed at [facility]    Physical Findings: AIMS: Facial and Oral Movements Muscles of Facial Expression: None, normal Lips and Perioral Area: None, normal Jaw: None, normal Tongue: None, normal,Extremity Movements Upper (arms, wrists, hands, fingers): None, normal Lower (legs, knees, ankles, toes): None, normal, Trunk Movements Neck, shoulders, hips: None, normal, Overall Severity Severity of abnormal movements (highest score from questions above): None, normal Incapacitation due to abnormal movements: None, normal Patient's awareness of abnormal movements (rate only patient's report): No Awareness, Dental Status Current problems with teeth and/or dentures?: No Does patient usually wear dentures?: No  CIWA:  CIWA-Ar Total: 4 COWS:  COWS Total Score: 2  Treatment Plan Summary: Daily contact with patient to assess and evaluate symptoms and progress in treatment  Plan:Repeat CMP in AM;Continue detox protocol;reasess 6/27  Medical Decision Making Problem Points:  Established problem, stable/improving (1) and Review of psycho-social stressors (1) Data Points:  Review and summation of old records (2) Review of medication regiment & side effects (2)  I certify that inpatient services furnished can reasonably be expected to improve the patient's condition.   Dara Hoyer 02/10/2014, 4:04 PM

## 2014-02-10 NOTE — Plan of Care (Signed)
[place] Observation Crisis Plan  Reason for Crisis Plan:  Crisis Stabilization   Plan of Care:  Pt is to remain in Observation Unit for another night.  Family Support:    Sister: Marquet Faircloth [phone]; Brother: Dola Argyle (phone number unknown)  Current Living Environment:  Living Arrangements: Alone  Insurance:   Hospital Account   Name Acct ID Class Status Primary Coverage   [employer], [place] [id number] [place] - MEDICARE PART A AND B        Guarantor Account (for Hospital Account [id number])   Name Relation to Pt Service Area Active? Acct Type   [name] Self [facility]   Address Phone       [address]  [place], [place] [address] [phone]          Coverage Information (for Hospital Account [id number])   1. MEDICARE/MEDICARE PART A AND B   F/O Payor/Plan Precert #   MEDICARE/MEDICARE PART A AND B    Subscriber Subscriber #   Jamond, Neels [id number]   Address Phone   [address] [place], [place] [address]        2. [place]   F/O Payor/Plan Precert #   [facility] FOR MH/DD/SAS/[place]-NON [facility] SPONSORSHIP    Subscriber Subscriber #   Ottavio, Norem [id number]   Address Phone   [address] [place], [place] [address] [phone]          Legal Guardian:   Self  Primary Care Provider:  No primary provider on file.  Current Outpatient Providers:  [facility]  Psychiatrist:    at [facility] [place]  Counselor/Therapist:   None  Compliant with Medications:  No; stopped takine Tegretol XR, Doxepin, Zoloft, and Geodon 3 - 4 weeks ago.  Additional Information: After consulting with Darlyne Russian, PA it has been determined that pt would benefit from further monitoring on the Observation Unit.  He will remain overnight.  Pt has been notified and is agreeable.  He has signed Consent to Release Information to  [facility].   Abbe Amsterdam 6/26/20153:55 PM

## 2014-02-10 NOTE — Progress Notes (Signed)
Patient ID: [name], male   DOB: Jul 30, 1971, 43 y.o.   MRN: [id number]  D: Patient restless and with complaints of anxiety. Pt also with c/o withdraw symptoms. Pt is, however, pleasant and cooperative with care. A: Q 15 minute safety checks per protocol. Administer medications as ordered by MD. Monitor withdraw symptoms. Reassure pt as needed. R: Pt denies SI or plans to harm himself, denies HI or A/V hallucinations. No s/s of distress noted at this time.

## 2014-02-10 NOTE — Progress Notes (Signed)
Psychiatric supervisory review confirms these findings, diagnostic considerations, and therapeutic interventions underway. Headache and abdominal pain likely residual of acute intoxication complicated by slightly elevated lipase, moderately elevated hepatocellular and biliary enzymes, and dilutional hyponatremia warranting his usual ranitidine including for reflux. Formulary substitution of Pepcid is acceptable. Although he initially denied taking his Tegretol, his Tegretol level of 3 suggests ongoing treatment and patient not abruptly discontinue Tegretol in the setting of early alcohol withdrawal. Treatment continues in observation unit.  Delight Hoh, MD

## 2014-02-11 ENCOUNTER — Encounter (HOSPITAL_COMMUNITY): Payer: Self-pay

## 2014-02-11 DIAGNOSIS — F329 Major depressive disorder, single episode, unspecified: Secondary | ICD-10-CM | POA: Diagnosis present

## 2014-02-11 LAB — COMPREHENSIVE METABOLIC PANEL
ALT: 208 U/L — ABNORMAL HIGH (ref 0–53)
AST: 145 U/L — ABNORMAL HIGH (ref 0–37)
Albumin: 4.1 g/dL (ref 3.5–5.2)
Alkaline Phosphatase: 113 U/L (ref 39–117)
BUN: 13 mg/dL (ref 6–23)
CO2: 33 mEq/L — ABNORMAL HIGH (ref 19–32)
Calcium: 10.1 mg/dL (ref 8.4–10.5)
Chloride: 86 mEq/L — ABNORMAL LOW (ref 96–112)
Creatinine, Ser: 0.79 mg/dL (ref 0.50–1.35)
GFR calc Af Amer: >90 mL/min (ref >90)
GFR calc non Af Amer: >90 mL/min (ref >90)
Glucose, Bld: 91 mg/dL (ref 70–99)
Potassium: 3.1 mEq/L — ABNORMAL LOW (ref 3.7–5.3)
Sodium: 133 mEq/L — ABNORMAL LOW (ref 137–147)
Total Bilirubin: 1 mg/dL (ref 0.3–1.2)
Total Protein: 7.4 g/dL (ref 6.0–8.3)

## 2014-02-11 LAB — LIPASE, BLOOD: Lipase: 77 U/L — ABNORMAL HIGH (ref 11–59)

## 2014-02-11 MED ORDER — LORAZEPAM 1 MG PO TABS
1.0000 mg | ORAL_TABLET | Freq: Every day | ORAL | Status: AC
  Administered 2014-02-12: 1 mg via ORAL

## 2014-02-11 MED ORDER — MAGNESIUM HYDROXIDE 400 MG/5ML PO SUSP: 30.0000 mL | Freq: Every day | ORAL | Status: DC | PRN

## 2014-02-11 MED ORDER — ADULT MULTIVITAMIN W/MINERALS CH
1.0000 | ORAL_TABLET | Freq: Every day | ORAL | Status: DC
  Administered 2014-02-12 – 2014-02-16 (×5): 1 via ORAL
  Filled 2014-02-11 (×7): qty 1

## 2014-02-11 MED ORDER — CARBAMAZEPINE ER 200 MG PO TB12
200.0000 mg | ORAL_TABLET | Freq: Two times a day (BID) | ORAL | Status: DC
  Administered 2014-02-12 – 2014-02-16 (×9): 200 mg via ORAL
  Filled 2014-02-11 (×12): qty 1

## 2014-02-11 MED ORDER — POTASSIUM CHLORIDE CRYS ER 20 MEQ PO TBCR
20.0000 meq | EXTENDED_RELEASE_TABLET | Freq: Once | ORAL | Status: AC
  Administered 2014-02-11: 20 meq via ORAL
  Filled 2014-02-11 (×2): qty 1

## 2014-02-11 MED ORDER — POTASSIUM CHLORIDE CRYS ER 20 MEQ PO TBCR
20.0000 meq | EXTENDED_RELEASE_TABLET | ORAL | Status: DC
  Administered 2014-02-12 – 2014-02-14 (×3): 20 meq via ORAL
  Filled 2014-02-11 (×6): qty 1

## 2014-02-11 MED ORDER — LOPERAMIDE HCL 2 MG PO CAPS
2.0000 mg | ORAL_CAPSULE | ORAL | Status: AC | PRN
  Administered 2014-02-12 (×3): 2 mg via ORAL
  Filled 2014-02-11 (×3): qty 1

## 2014-02-11 MED ORDER — ACETAMINOPHEN 325 MG PO TABS: 650.0000 mg | ORAL_TABLET | Freq: Four times a day (QID) | ORAL | Status: DC | PRN

## 2014-02-11 MED ORDER — FAMOTIDINE 20 MG PO TABS
20.0000 mg | ORAL_TABLET | Freq: Every day | ORAL | Status: DC
Start: 2014-02-12 — End: 2014-02-16
  Administered 2014-02-12 – 2014-02-15 (×4): 20 mg via ORAL
  Filled 2014-02-11 (×6): qty 1

## 2014-02-11 MED ORDER — ATENOLOL 50 MG PO TABS
50.0000 mg | ORAL_TABLET | Freq: Every day | ORAL | Status: DC
  Administered 2014-02-12 – 2014-02-16 (×4): 50 mg via ORAL
  Filled 2014-02-11 (×7): qty 1

## 2014-02-11 MED ORDER — LORAZEPAM 1 MG PO TABS
1.0000 mg | ORAL_TABLET | Freq: Four times a day (QID) | ORAL | Status: AC | PRN
  Administered 2014-02-11: 1 mg via ORAL
  Filled 2014-02-11 (×2): qty 1

## 2014-02-11 MED ORDER — HYDROXYZINE HCL 25 MG PO TABS
25.0000 mg | ORAL_TABLET | Freq: Four times a day (QID) | ORAL | Status: AC | PRN
  Administered 2014-02-11: 25 mg via ORAL
  Filled 2014-02-11: qty 1

## 2014-02-11 MED ORDER — POTASSIUM CHLORIDE CRYS ER 20 MEQ PO TBCR
20.0000 meq | EXTENDED_RELEASE_TABLET | Freq: Once | ORAL | Status: DC
  Filled 2014-02-11: qty 1

## 2014-02-11 MED ORDER — IBUPROFEN 600 MG PO TABS: 600.0000 mg | ORAL_TABLET | Freq: Three times a day (TID) | ORAL | Status: DC | PRN

## 2014-02-11 MED ORDER — ALUM & MAG HYDROXIDE-SIMETH 200-200-20 MG/5ML PO SUSP: 30.0000 mL | ORAL | Status: DC | PRN

## 2014-02-11 MED ORDER — VITAMIN B-1 100 MG PO TABS
100.0000 mg | ORAL_TABLET | Freq: Every day | ORAL | Status: DC
  Administered 2014-02-12 – 2014-02-16 (×5): 100 mg via ORAL
  Filled 2014-02-11 (×7): qty 1

## 2014-02-11 MED ORDER — NICOTINE 21 MG/24HR TD PT24
21.0000 mg | MEDICATED_PATCH | Freq: Every day | TRANSDERMAL | Status: DC
  Administered 2014-02-12 – 2014-02-16 (×5): 21 mg via TRANSDERMAL
  Filled 2014-02-11 (×8): qty 1

## 2014-02-11 MED ORDER — ONDANSETRON 4 MG PO TBDP: 4.0000 mg | ORAL_TABLET | Freq: Four times a day (QID) | ORAL | Status: AC | PRN

## 2014-02-11 NOTE — Tx Team (Signed)
Initial Interdisciplinary Treatment Plan  PATIENT STRENGTHS: (choose at least two) Average or above average intelligence Capable of independent living General fund of knowledge Supportive family/friends  PATIENT STRESSORS: Medication change or noncompliance Substance abuse   PROBLEM LIST: Problem List/Patient Goals Date to be addressed Date deferred Reason deferred Estimated date of resolution  Etoh Dependence                                                       DISCHARGE CRITERIA:  Ability to meet basic life and health needs Improved stabilization in mood, thinking, and/or behavior Withdrawal symptoms are absent or subacute and managed without 24-hour nursing intervention  PRELIMINARY DISCHARGE PLAN: Attend aftercare/continuing care group Attend 12-step recovery group Return to previous living arrangement  PATIENT/FAMIILY INVOLVEMENT: This treatment plan has been presented to and reviewed with the patient, [name], and/or family member, .  The patient and family have been given the opportunity to ask questions and make suggestions.  [name] 02/11/2014, 6:46 PM

## 2014-02-11 NOTE — Discharge Summary (Signed)
  Patient is discharged from Observation unit and is transferred to inpatient behavioral unit to continue treatment.  He need to resume his schizoaffective disorder medications which includes his mood stabilizers..  His LFT and potassium level remains low.  Patient remains anxious and is being medicated with Lorazepam.  Patient will benefit from an inpatient admission for safety and stability.  Charmaine Downs   PMHNP-BC I agree with assessment and plan Geralyn Flash A. Sabra Heck, M.D.

## 2014-02-11 NOTE — Progress Notes (Signed)
Adult Psychoeducational Group Note  Date:  02/11/2014 Time:  10:20 PM  Group Topic/Focus:  Wrap-Up Group:   The focus of this group is to help patients review their daily goal of treatment and discuss progress on daily workbooks.  Participation Level:  Active  Participation Quality:  Appropriate  Affect:  Appropriate  Cognitive:  Appropriate  Insight: Good  Engagement in Group:  Engaged  Modes of Intervention:  Discussion  Additional Comments: The patient expressed that he just arrived from the OBS Unit .The patient also said that he received bad news regarding his health.  Nash Shearer 02/11/2014, 10:20 PM

## 2014-02-11 NOTE — Progress Notes (Signed)
[facility] MD Progress Note  02/11/2014 5:22 PM [name]  MRN:  [id number] Subjective:  Patient was admitted to our observation unit for alcohol abuse and schizoaffective disorder hx.  Patient reports he has not been taking his medications and has been self medicating with Alcohol.  Today patient is anxious and has tremors of his fingers.  He has been having diarrhea and had required administration of imodium.  Patient's potasium remains low at 3.1.  We have accepted him for admission and will transfer him to our 500 hall unit. Diagnosis:  Schizoaffective disorder, Alcohol abuse DSM5: Schizophrenia Disorders:  NA Obsessive-Compulsive Disorders:  NA Trauma-Stressor Disorders:  NA Substance/Addictive Disorders:  Alcohol Related Disorder - Severe (303.90) Depressive Disorders:  Major Depressive Disorder - Severe (296.23) Total Time spent with patient: 30 minutes  Axis I: Alcohol Abuse and Schizoaffective Disorder Axis II: Deferred Axis III:  Past Medical History  Diagnosis Date  . Depression   . Psychosis   . PTSD (post-traumatic stress disorder)   . Seizure disorder     related to etoh seizure  . Alcohol abuse   . Schizoaffective disorder   . Anxiety    Axis IV: other psychosocial or environmental problems and problems related to social environment Axis V: 41-50 serious symptoms  ADL's:  Impaired  Sleep: Fair  Appetite:  Fair  Suicidal Ideation:  NA Homicidal Ideation:  NA   Psychiatric Specialty Exam: Physical Exam  ROS  Blood pressure 112/75, pulse 62, temperature 98.2 F (36.8 C), temperature source Oral, resp. rate 17, height 5' 9"  (1.753 m), weight 82.555 kg (182 lb), SpO2 92.00%.Body mass index is 26.86 kg/(m^2).  General Appearance: Casual and Disheveled  Eye Contact::  Fair  Speech:  Clear and Coherent and Normal Rate  Volume:  Normal  Mood:  Anxious, Depressed, Hopeless and Worthless  Affect:  Congruent, Depressed and Flat  Thought Process:  Coherent   Orientation:  Full (Time, Place, and Person)  Thought Content:  WDL  Suicidal Thoughts:  No  Homicidal Thoughts:  No  Memory:  Immediate;   Good Recent;   Fair Remote;   Fair  Judgement:  Impaired  Insight:  Present  Psychomotor Activity:  Tremor  Concentration:  Good  Recall:  NA  Fund of Knowledge:Good  Language: Good  Akathisia:  NA  Handed:  Right  AIMS (if indicated):     Assets:  Desire for Improvement  Sleep:      Musculoskeletal: Strength & Muscle Tone: within normal limits Gait & Station: normal Patient leans: N/A  Current Medications: Current Facility-Administered Medications  Medication Dose Route Frequency Henrick Mcgue Last Rate Last Dose  . atenolol (TENORMIN) tablet 50 mg  50 mg Oral Daily Dara Hoyer, PA-C   50 mg at 02/11/14 7026  . carbamazepine (TEGRETOL XR) 12 hr tablet 200 mg  200 mg Oral BID Delight Hoh, MD   200 mg at 02/11/14 1644  . famotidine (PEPCID) tablet 20 mg  20 mg Oral Daily Delight Hoh, MD   20 mg at 02/11/14 3785  . hydrOXYzine (ATARAX/VISTARIL) tablet 25 mg  25 mg Oral Q6H PRN Hampton Abbot, MD   25 mg at 02/10/14 2048  . ibuprofen (ADVIL,MOTRIN) tablet 600 mg  600 mg Oral Q8H PRN Benjamine Mola, FNP   600 mg at 02/10/14 8850  . loperamide (IMODIUM) capsule 2-4 mg  2-4 mg Oral PRN Hampton Abbot, MD   2 mg at 02/11/14 1644  . [START ON 02/12/2014] LORazepam (ATIVAN) tablet  1 mg  1 mg Oral Daily Hampton Abbot, MD      . LORazepam (ATIVAN) tablet 1 mg  1 mg Oral Q6H PRN Hampton Abbot, MD   1 mg at 02/11/14 0241  . multivitamin with minerals tablet 1 tablet  1 tablet Oral Daily Hampton Abbot, MD   1 tablet at 02/11/14 [phone]  . nicotine (NICODERM CQ - dosed in mg/24 hours) patch 21 mg  21 mg Transdermal Daily Benjamine Mola, FNP   21 mg at 02/11/14 0819  . ondansetron (ZOFRAN-ODT) disintegrating tablet 4 mg  4 mg Oral Q6H PRN Hampton Abbot, MD   4 mg at 02/10/14 1202  . potassium chloride SA (K-DUR,KLOR-CON) CR tablet 20 mEq  20 mEq Oral  Once Delfin Gant, NP      . thiamine (VITAMIN B-1) tablet 100 mg  100 mg Oral Daily Hampton Abbot, MD   100 mg at 02/11/14 3704    Lab Results:  Results for orders placed during the hospital encounter of 02/09/14 (from the past 48 hour(s))  CARBAMAZEPINE LEVEL, TOTAL     Status: Abnormal   Collection Time    02/10/14  6:50 AM      Result Value Ref Range   Carbamazepine Lvl 3.0 (*) 4.0 - 12.0 ug/mL   Comment: Performed at [place] PANEL     Status: Abnormal   Collection Time    02/10/14  6:50 AM      Result Value Ref Range   Sodium 132 (*) 137 - 147 mEq/L   Potassium 3.1 (*) 3.7 - 5.3 mEq/L   Chloride 81 (*) 96 - 112 mEq/L   CO2 32  19 - 32 mEq/L   Glucose, Bld 111 (*) 70 - 99 mg/dL   BUN 16  6 - 23 mg/dL   Creatinine, Ser 0.76  0.50 - 1.35 mg/dL   Calcium 10.4  8.4 - 10.5 mg/dL   Total Protein 7.5  6.0 - 8.3 g/dL   Albumin 4.5  3.5 - 5.2 g/dL   AST 211 (*) 0 - 37 U/L   ALT 257 (*) 0 - 53 U/L   Alkaline Phosphatase 129 (*) 39 - 117 U/L   Total Bilirubin 1.3 (*) 0.3 - 1.2 mg/dL   GFR calc non Af Amer >90  >90 mL/min   GFR calc Af Amer >90  >90 mL/min   Comment: (NOTE)     The eGFR has been calculated using the CKD EPI equation.     This calculation has not been validated in all clinical situations.     eGFR's persistently <90 mL/min signify possible Chronic Kidney     Disease.     Performed at [place] [facility]     Status: Abnormal   Collection Time    02/10/14  6:50 AM      Result Value Ref Range   GGT 450 (*) 7 - 51 U/L   Comment: Performed at [place] [facility]     Status: None   Collection Time    02/10/14  6:50 AM      Result Value Ref Range   Total CK 121  7 - 232 U/L   CK, MB <1.0  0.3 - 4.0 ng/mL   Comment: REPEATED TO VERIFY   Relative Index NOT CALCULATED  0.0 - 2.5   Comment: Performed at [place], BLOOD     Status: Abnormal  Collection Time     02/10/14  6:50 AM      Result Value Ref Range   Lipase 65 (*) 11 - 59 U/L   Comment: Performed at [place]     Status: None   Collection Time    02/10/14  6:50 AM      Result Value Ref Range   Magnesium 2.3  1.5 - 2.5 mg/dL   Comment: Performed at [place] PANEL     Status: Abnormal   Collection Time    02/11/14  6:36 AM      Result Value Ref Range   Sodium 133 (*) 137 - 147 mEq/L   Potassium 3.1 (*) 3.7 - 5.3 mEq/L   Chloride 86 (*) 96 - 112 mEq/L   CO2 33 (*) 19 - 32 mEq/L   Glucose, Bld 91  70 - 99 mg/dL   BUN 13  6 - 23 mg/dL   Creatinine, Ser 0.79  0.50 - 1.35 mg/dL   Calcium 10.1  8.4 - 10.5 mg/dL   Total Protein 7.4  6.0 - 8.3 g/dL   Albumin 4.1  3.5 - 5.2 g/dL   AST 145 (*) 0 - 37 U/L   Comment: SLIGHT HEMOLYSIS     HEMOLYSIS AT THIS LEVEL MAY AFFECT RESULT   ALT 208 (*) 0 - 53 U/L   Alkaline Phosphatase 113  39 - 117 U/L   Total Bilirubin 1.0  0.3 - 1.2 mg/dL   GFR calc non Af Amer >90  >90 mL/min   GFR calc Af Amer >90  >90 mL/min   Comment: (NOTE)     The eGFR has been calculated using the CKD EPI equation.     This calculation has not been validated in all clinical situations.     eGFR's persistently <90 mL/min signify possible Chronic Kidney     Disease.     Performed at [place], BLOOD     Status: Abnormal   Collection Time    02/11/14  6:36 AM      Result Value Ref Range   Lipase 77 (*) 11 - 59 U/L   Comment: Performed at [facility]    Physical Findings: AIMS: Facial and Oral Movements Muscles of Facial Expression: None, normal Lips and Perioral Area: None, normal Jaw: None, normal Tongue: None, normal,Extremity Movements Upper (arms, wrists, hands, fingers): None, normal Lower (legs, knees, ankles, toes): None, normal, Trunk Movements Neck, shoulders, hips: None, normal, Overall Severity Severity of abnormal movements  (highest score from questions above): None, normal Incapacitation due to abnormal movements: None, normal Patient's awareness of abnormal movements (rate only patient's report): No Awareness, Dental Status Current problems with teeth and/or dentures?: No Does patient usually wear dentures?: No  CIWA:  CIWA-Ar Total: 1 COWS:  COWS Total Score: 2  Treatment Plan Summary: Daily contact with patient to assess and evaluate symptoms and progress in treatment Medication management  Plan:   Admit for crisis management/stabilization. Review and reinstate any pertinent home medications for other health issues.   Medication management to treat current mood problems. Group counseling sessions and activities. Primary care consults as needed. Continue current treatment plan .  Medical Decision Making Problem Points:  Established problem, stable/improving (1) Data Points:  Review and summation of old records (2)  I certify that inpatient services furnished can reasonably be expected to improve the patient's condition.   Charmaine Downs, C  PMHNP-BC 02/11/2014, 5:22 PM I agree with assessment and plan Geralyn Flash A. Sabra Heck, M.D.

## 2014-02-11 NOTE — Progress Notes (Signed)
Psychoeducational Group Note  Date: 02/11/2014 Time:  1015  Group Topic/Focus:  Identifying Needs:   The focus of this group is to help patients identify their personal needs that have been historically problematic and identify healthy behaviors to address their needs.  Participation Level:  Active  Participation Quality:  Appropriate  Affect:  Appropriate  Cognitive:  Oriented  Insight:  Improving  Engagement in Group:  Engaged  Additional Comments:    Paulino Rily

## 2014-02-11 NOTE — Progress Notes (Signed)
Pt is a 43 year old male pt admitted with ETOH dependency and some depression with psychotic features   He admits to hearing voices and visual hallucinations at times and said he started drinking again because his medications stopped working   He was in the OBS unit and was supposed to be discharged but his K+ was low so he was admitted to the adult unit   He is cooperative and pleasant  He denies suicidal ideation   He drinks 6 to 8 24 oz beers daily and is seeking detox   Verbal support given  Oriented to the unit and offered nourishment   Q 15 min checks explained and implemented   Pt is safe and adjusting well

## 2014-02-11 NOTE — Progress Notes (Signed)
Patient ID: [name], male   DOB: 09-28-1970, 43 y.o.   MRN: [id number] Pt visible in the milieu.  Interacting appropriately with staff and peers.  Attending groups.  Pt reported he recently had medication change which was not working and was therefore positive for AVH.  Pt reported he has not experienced AVH today but that they are usually worse at night.  Pt denied SI and HI.  Pt reported he will speak to MD in the morning concerning medications as he feels medication adjustment is still needed.  Support and encouragement given.  Pt receptive.  Fifteen minute checks in progress for patient safety.  Pt safe on unit.

## 2014-02-11 NOTE — Progress Notes (Signed)
Patient ID: [name], male   DOB: [birth year], 43 y.o.   MRN: [id number] D: Patient denies SI/HI and auditory and visual hallucinations. No complaint of signs or symptoms of withdrawal other than anxiety. Depressed mood and affect.Slept most of the day.  A: Patient given emotional support from RN. Patient given medications per MD orders.  Patient encouraged to come to staff with any questions or concerns.  R: Patient remains cooperative and appropriate. Will continue to monitor patient for safety.

## 2014-02-12 ENCOUNTER — Encounter (HOSPITAL_COMMUNITY): Payer: Self-pay | Admitting: Psychiatry

## 2014-02-12 LAB — COMPREHENSIVE METABOLIC PANEL
ALT: 209 U/L — ABNORMAL HIGH (ref 0–53)
AST: 150 U/L — ABNORMAL HIGH (ref 0–37)
Albumin: 4.3 g/dL (ref 3.5–5.2)
Alkaline Phosphatase: 106 U/L (ref 39–117)
BUN: 14 mg/dL (ref 6–23)
CO2: 32 mEq/L (ref 19–32)
Calcium: 10 mg/dL (ref 8.4–10.5)
Chloride: 88 mEq/L — ABNORMAL LOW (ref 96–112)
Creatinine, Ser: 0.83 mg/dL (ref 0.50–1.35)
GFR calc Af Amer: >90 mL/min (ref >90)
GFR calc non Af Amer: >90 mL/min (ref >90)
Glucose, Bld: 105 mg/dL — ABNORMAL HIGH (ref 70–99)
Potassium: 3.6 mEq/L — ABNORMAL LOW (ref 3.7–5.3)
Sodium: 135 mEq/L — ABNORMAL LOW (ref 137–147)
Total Bilirubin: 1.2 mg/dL (ref 0.3–1.2)
Total Protein: 7.2 g/dL (ref 6.0–8.3)

## 2014-02-12 LAB — CBC WITH DIFFERENTIAL/PLATELET
Basophils Absolute: 0 10*3/uL (ref 0.0–0.1)
Basophils Relative: 0 % (ref 0–1)
Eosinophils Absolute: 0.1 10*3/uL (ref 0.0–0.7)
Eosinophils Relative: 1 % (ref 0–5)
HCT: 45 % (ref 39.0–52.0)
Hemoglobin: 15.8 g/dL (ref 13.0–17.0)
Lymphocytes Relative: 48 % — ABNORMAL HIGH (ref 12–46)
Lymphs Abs: 2.9 10*3/uL (ref 0.7–4.0)
MCH: 33.8 pg (ref 26.0–34.0)
MCHC: 35.1 g/dL (ref 30.0–36.0)
MCV: 96.4 fL (ref 78.0–100.0)
Monocytes Absolute: 0.8 10*3/uL (ref 0.1–1.0)
Monocytes Relative: 12 % (ref 3–12)
Neutro Abs: 2.4 10*3/uL (ref 1.7–7.7)
Neutrophils Relative %: 39 % — ABNORMAL LOW (ref 43–77)
Platelets: 181 10*3/uL (ref 150–400)
RBC: 4.67 MIL/uL (ref 4.22–5.81)
RDW: 13.7 % (ref 11.5–15.5)
WBC: 6.1 10*3/uL (ref 4.0–10.5)

## 2014-02-12 MED ORDER — QUETIAPINE FUMARATE 100 MG PO TABS
ORAL_TABLET | ORAL | Status: AC
  Filled 2014-02-12: qty 2

## 2014-02-12 MED ORDER — SERTRALINE HCL 50 MG PO TABS
50.0000 mg | ORAL_TABLET | Freq: Every day | ORAL | Status: DC
  Administered 2014-02-12 – 2014-02-16 (×5): 50 mg via ORAL
  Filled 2014-02-12 (×9): qty 1

## 2014-02-12 MED ORDER — QUETIAPINE FUMARATE 200 MG PO TABS
200.0000 mg | ORAL_TABLET | Freq: Every morning | ORAL | Status: DC
  Administered 2014-02-12 – 2014-02-16 (×5): 200 mg via ORAL
  Filled 2014-02-12 (×7): qty 1

## 2014-02-12 MED ORDER — QUETIAPINE FUMARATE 200 MG PO TABS
200.0000 mg | ORAL_TABLET | Freq: Every day | ORAL | Status: DC
  Administered 2014-02-12: 200 mg via ORAL
  Filled 2014-02-12 (×4): qty 1

## 2014-02-12 NOTE — Progress Notes (Signed)
Report received from [facility]. Patient currently lying in bed asleep eyes closed and respirations even and unlabored. No distress noted. Safety maintained on unit with 15 min checks.

## 2014-02-12 NOTE — H&P (Signed)
Psychiatric Admission Assessment Adult  Patient Identification:  [name] Date of Evaluation:  02/12/2014 Chief Complaint:  SCHIZOAFFECTIVE History of Present Illness::   43 yo male presently voluntarily to [facility] requesting treatment for alcohol abuse and symptoms related to his diagnosis of schizoaffective disorder.  He has been drinking 6-8 "tall boys" daily.   He was hospitalized/BH approximately one month ago on Geodon and he stopped taking it because it doesn't work.  He now reports VH of seeing "shadow people" (tall and black)  and AH  He hears them laughing at him.  "It is scary"  Reports he was stable in past on Seroquel 200 mg q am/Seroquesl 600 mg qhs--will initiated and will need titration                                                            Tegretol ER 200 mg every 12 hrs He has fleeting SI and contracts for safety     Denies HI  He is calm and cooperative   Elements:  Location:  ER [facility]. Quality:  worsening over the past month. Severity:  severe. Timing:  past month. Duration:  constant. Context:  without medications. Associated Signs/Synptoms: Depression Symptoms:  depressed mood, insomnia, fatigue, impaired memory, loss of energy/fatigue, (Hypo) Manic Symptoms:   Anxiety Symptoms:  Excessive Worry, Psychotic Symptoms:  Hallucinations: Auditory  "shadow people" they laugh at me PTSD Symptoms: Had a traumatic exposure:  saw his GF die in his presetnc from OD Total Time spent with patient: 30 minutes  Psychiatric Specialty Exam: Physical Exam  Constitutional: He is oriented to person, place, and time. He appears well-developed and well-nourished.  HENT:  Head: Normocephalic.  Neck: Normal range of motion.  Musculoskeletal: Normal range of motion.  Neurological: He is alert and oriented to person, place, and time.  Skin: Skin is warm and dry.    Review of Systems  Constitutional: Negative.   HENT: Negative.   Eyes: Negative.   Respiratory:  Negative.   Cardiovascular: Negative.   Musculoskeletal: Negative.   Skin: Negative.   Psychiatric/Behavioral: Positive for hallucinations and substance abuse.    Blood pressure 112/75, pulse 62, temperature 98.1 F (36.7 C), temperature source Oral, resp. rate 16, height 5' 9"  (1.753 m), weight 82.555 kg (182 lb), SpO2 92.00%.Body mass index is 26.86 kg/(m^2).  General Appearance: Fairly Groomed  Engineer, water::  Fair  Speech:  Slow  Volume:  Decreased  Mood:  Anxious and Depressed  Affect:  Flat  Thought Process:  Linear  Orientation:  Full (Time, Place, and Person)  Thought Content:  Hallucinations: Auditory  Suicidal Thoughts:  No  Homicidal Thoughts:  No  Memory:  Immediate;   Fair Recent;   Fair Remote;   Fair  Judgement:  Impaired  Insight:  Lacking  Psychomotor Activity:  Decreased  Concentration:  Fair  Recall:  [place]: Fair  Akathisia:  Negative  Handed:  Right  AIMS (if indicated):     Assets:  Communication Skills Desire for Improvement Financial Resources/Insurance Housing Physical Health Resilience  Sleep:  Number of Hours: 4.25    Musculoskeletal: Strength & Muscle Tone: within normal limits Gait & Station: normal Patient leans: N/A  Past Psychiatric History: Diagnosis:  Hospitalizations:  Outpatient Care:  Substance Abuse Care:  Self-Mutilation:  Suicidal Attempts:  Violent Behaviors:   Past Medical History:   Past Medical History  Diagnosis Date  . Depression   . Psychosis   . PTSD (post-traumatic stress disorder)   . Seizure disorder     related to etoh seizure  . Alcohol abuse   . Schizoaffective disorder   . Anxiety    None. Allergies:  No Known Allergies PTA Medications: Prescriptions prior to admission  Medication Sig Dispense Refill  . carbamazepine (TEGRETOL XR) 200 MG 12 hr tablet Take 1 tablet (200 mg total) by mouth 2 (two) times daily.  60 tablet  0  . doxepin (SINEQUAN) 50 MG capsule  Take 1 capsule (50 mg total) by mouth at bedtime.  30 capsule  0  . prazosin (MINIPRESS) 5 MG capsule Take 1 capsule (5 mg total) by mouth at bedtime.  30 capsule  0  . ranitidine (ZANTAC) 75 MG tablet Take 75 mg by mouth daily as needed for heartburn.      . sertraline (ZOLOFT) 100 MG tablet Take 2 tablets (200 mg total) by mouth daily.  60 tablet  0    Previous Psychotropic Medications:  Medication/Dose      See Mar           Substance Abuse History in the last 12 months:  Yes.    Consequences of Substance Abuse: Legal Consequences:  DWI  Social History:  reports that he has been smoking Cigarettes.  He has a 36 pack-year smoking history. He has never used smokeless tobacco. He reports that he drinks about 50.4 ounces of alcohol per week. He reports that he uses illicit drugs (Marijuana) about once per week. Additional Social History: History of alcohol / drug use?: Yes Negative Consequences of Use: Personal relationships Withdrawal Symptoms: Irritability;Tremors Name of Substance 1: ETOH 1 - Last Use / Amount: 6 to 8 24 oz beer Name of Substance 2: pot 2 - Last Use / Amount: occasional                Current Place of Residence:   Place of Birth:   Family Members: Marital Status:  Divorced Children:  Sons:  Daughters: daughter 2 adopted by ex-wife husband Relationships: Education:  Secretary/administrator  2 yrs  Educational Problems/Performance: Religious Beliefs/Practices: History of Abuse (Emotional/Phsycial/Sexual) Occupational Experiences; Military History:  None. Legal History: Hobbies/Interests: Music  Family History:  No family history on file.  Results for orders placed during the hospital encounter of 02/09/14 (from the past 72 hour(s))  CARBAMAZEPINE LEVEL, TOTAL     Status: Abnormal   Collection Time    02/10/14  6:50 AM      Result Value Ref Range   Carbamazepine Lvl 3.0 (*) 4.0 - 12.0 ug/mL   Comment: Performed at [place] PANEL     Status: Abnormal   Collection Time    02/10/14  6:50 AM      Result Value Ref Range   Sodium 132 (*) 137 - 147 mEq/L   Potassium 3.1 (*) 3.7 - 5.3 mEq/L   Chloride 81 (*) 96 - 112 mEq/L   CO2 32  19 - 32 mEq/L   Glucose, Bld 111 (*) 70 - 99 mg/dL   BUN 16  6 - 23 mg/dL   Creatinine, Ser 0.76  0.50 - 1.35 mg/dL   Calcium 10.4  8.4 - 10.5 mg/dL   Total Protein 7.5  6.0 - 8.3 g/dL   Albumin 4.5  3.5 -  5.2 g/dL   AST 211 (*) 0 - 37 U/L   ALT 257 (*) 0 - 53 U/L   Alkaline Phosphatase 129 (*) 39 - 117 U/L   Total Bilirubin 1.3 (*) 0.3 - 1.2 mg/dL   GFR calc non Af Amer >90  >90 mL/min   GFR calc Af Amer >90  >90 mL/min   Comment: (NOTE)     The eGFR has been calculated using the CKD EPI equation.     This calculation has not been validated in all clinical situations.     eGFR's persistently <90 mL/min signify possible Chronic Kidney     Disease.     Performed at [facility]     Status: Abnormal   Collection Time    02/10/14  6:50 AM      Result Value Ref Range   GGT 450 (*) 7 - 51 U/L   Comment: Performed at [place] [facility]     Status: None   Collection Time    02/10/14  6:50 AM      Result Value Ref Range   Total CK 121  7 - 232 U/L   CK, MB <1.0  0.3 - 4.0 ng/mL   Comment: REPEATED TO VERIFY   Relative Index NOT CALCULATED  0.0 - 2.5   Comment: Performed at [place], BLOOD     Status: Abnormal   Collection Time    02/10/14  6:50 AM      Result Value Ref Range   Lipase 65 (*) 11 - 59 U/L   Comment: Performed at [place]     Status: None   Collection Time    02/10/14  6:50 AM      Result Value Ref Range   Magnesium 2.3  1.5 - 2.5 mg/dL   Comment: Performed at [place] PANEL     Status: Abnormal   Collection Time    02/11/14  6:36 AM      Result Value Ref Range   Sodium 133 (*) 137 - 147 mEq/L    Potassium 3.1 (*) 3.7 - 5.3 mEq/L   Chloride 86 (*) 96 - 112 mEq/L   CO2 33 (*) 19 - 32 mEq/L   Glucose, Bld 91  70 - 99 mg/dL   BUN 13  6 - 23 mg/dL   Creatinine, Ser 0.79  0.50 - 1.35 mg/dL   Calcium 10.1  8.4 - 10.5 mg/dL   Total Protein 7.4  6.0 - 8.3 g/dL   Albumin 4.1  3.5 - 5.2 g/dL   AST 145 (*) 0 - 37 U/L   Comment: SLIGHT HEMOLYSIS     HEMOLYSIS AT THIS LEVEL MAY AFFECT RESULT   ALT 208 (*) 0 - 53 U/L   Alkaline Phosphatase 113  39 - 117 U/L   Total Bilirubin 1.0  0.3 - 1.2 mg/dL   GFR calc non Af Amer >90  >90 mL/min   GFR calc Af Amer >90  >90 mL/min   Comment: (NOTE)     The eGFR has been calculated using the CKD EPI equation.     This calculation has not been validated in all clinical situations.     eGFR's persistently <90 mL/min signify possible Chronic Kidney     Disease.     Performed at [place], BLOOD     Status:  Abnormal   Collection Time    02/11/14  6:36 AM      Result Value Ref Range   Lipase 77 (*) 11 - 59 U/L   Comment: Performed at [facility]  CBC WITH DIFFERENTIAL     Status: Abnormal   Collection Time    02/12/14  6:46 AM      Result Value Ref Range   WBC 6.1  4.0 - 10.5 K/uL   RBC 4.67  4.22 - 5.81 MIL/uL   Hemoglobin 15.8  13.0 - 17.0 g/dL   HCT 45.0  39.0 - 52.0 %   MCV 96.4  78.0 - 100.0 fL   MCH 33.8  26.0 - 34.0 pg   MCHC 35.1  30.0 - 36.0 g/dL   RDW 13.7  11.5 - 15.5 %   Platelets 181  150 - 400 K/uL   Neutrophils Relative % 39 (*) 43 - 77 %   Neutro Abs 2.4  1.7 - 7.7 K/uL   Lymphocytes Relative 48 (*) 12 - 46 %   Lymphs Abs 2.9  0.7 - 4.0 K/uL   Monocytes Relative 12  3 - 12 %   Monocytes Absolute 0.8  0.1 - 1.0 K/uL   Eosinophils Relative 1  0 - 5 %   Eosinophils Absolute 0.1  0.0 - 0.7 K/uL   Basophils Relative 0  0 - 1 %   Basophils Absolute 0.0  0.0 - 0.1 K/uL   Comment: Performed at [place] PANEL     Status: Abnormal    Collection Time    02/12/14  6:46 AM      Result Value Ref Range   Sodium 135 (*) 137 - 147 mEq/L   Potassium 3.6 (*) 3.7 - 5.3 mEq/L   Chloride 88 (*) 96 - 112 mEq/L   CO2 32  19 - 32 mEq/L   Glucose, Bld 105 (*) 70 - 99 mg/dL   BUN 14  6 - 23 mg/dL   Creatinine, Ser 0.83  0.50 - 1.35 mg/dL   Calcium 10.0  8.4 - 10.5 mg/dL   Total Protein 7.2  6.0 - 8.3 g/dL   Albumin 4.3  3.5 - 5.2 g/dL   AST 150 (*) 0 - 37 U/L   ALT 209 (*) 0 - 53 U/L   Alkaline Phosphatase 106  39 - 117 U/L   Total Bilirubin 1.2  0.3 - 1.2 mg/dL   GFR calc non Af Amer >90  >90 mL/min   GFR calc Af Amer >90  >90 mL/min   Comment: (NOTE)     The eGFR has been calculated using the CKD EPI equation.     This calculation has not been validated in all clinical situations.     eGFR's persistently <90 mL/min signify possible Chronic Kidney     Disease.     Performed at [facility]   Psychological Evaluations:  Assessment:   DSM5:  Schizophrenia Disorders:  Schizoaffective Obsessive-Compulsive Disorders:  Trauma-Stressor Disorders:  Posttraumatic Stress Disorder (309.81) witnessed death of fiance  Substance/Addictive Disorders:  Alcohol Related Disorder - Severe (303.90) Depressive Disorders:    AXIS I:  Schizoaffective Disorder and Substance Abuse AXIS II:  Schizoaffective AXIS III:   Past Medical History  Diagnosis Date  . Depression   . Psychosis   . PTSD (post-traumatic stress disorder)   . Seizure disorder     related to etoh seizure  . Alcohol abuse   .  Schizoaffective disorder   . Anxiety    AXIS IV:  other psychosocial or environmental problems, problems related to social environment and problems with primary support group AXIS V:  21-30 behavior considerably influenced by delusions or hallucinations OR serious impairment in judgment, communication OR inability to function in almost all areas  Treatment Plan/Recommendations:   Treatment Plan Summary: Review of chart, vital  signs, medications and notes Daily contact with the patient to assess and evaluate synmptoms and progress in treatment  Plan: 1. Continue crisis management and stabilization. Estimated length of stay 5-7 days  2.  Medication management to reduce current symptoms to base line and improve patient's overall level of functioning -initiate Seroquel 200 mg bid -Tegretol XR 200 mg bid -monitor Liver enzymes, slightly elevated Medications reviewed with the pateint and no untoward effects Individual and group therapy encouraged Coping skills for depression, substance abuse, and anxiety 3.  Treat health problems as indicated. 4.  Develop treatment plan to decrease risk of relapse upon discharge and the need for readmission 5.  Psych-social education regarding relapse prevention and self care. 6.  Health care follow up as needed for medical problems 7.  Continue home medications where appropriate 8.  Disposition in progress   Treatment Plan Summary: Daily contact with patient to assess and evaluate symptoms and progress in treatment Medication management Current Medications:  Current Facility-Administered Medications  Medication Dose Route Frequency Provider Last Rate Last Dose  . acetaminophen (TYLENOL) tablet 650 mg  650 mg Oral Q6H PRN Delfin Gant, NP      . alum & mag hydroxide-simeth (MAALOX/MYLANTA) 200-200-20 MG/5ML suspension 30 mL  30 mL Oral Q4H PRN Delfin Gant, NP      . atenolol (TENORMIN) tablet 50 mg  50 mg Oral Daily Delfin Gant, NP   50 mg at 02/12/14 0808  . carbamazepine (TEGRETOL XR) 12 hr tablet 200 mg  200 mg Oral BID Delfin Gant, NP   200 mg at 02/12/14 0809  . famotidine (PEPCID) tablet 20 mg  20 mg Oral Daily Delfin Gant, NP   20 mg at 02/12/14 0809  . hydrOXYzine (ATARAX/VISTARIL) tablet 25 mg  25 mg Oral Q6H PRN Delfin Gant, NP   25 mg at 02/11/14 2041  . ibuprofen (ADVIL,MOTRIN) tablet 600 mg  600 mg Oral Q8H PRN Delfin Gant, NP      . loperamide (IMODIUM) capsule 2-4 mg  2-4 mg Oral PRN Delfin Gant, NP   2 mg at 02/12/14 1356  . LORazepam (ATIVAN) tablet 1 mg  1 mg Oral Q6H PRN Delfin Gant, NP   1 mg at 02/11/14 2244  . magnesium hydroxide (MILK OF MAGNESIA) suspension 30 mL  30 mL Oral Daily PRN Delfin Gant, NP      . multivitamin with minerals tablet 1 tablet  1 tablet Oral Daily Delfin Gant, NP   1 tablet at 02/12/14 0809  . nicotine (NICODERM CQ - dosed in mg/24 hours) patch 21 mg  21 mg Transdermal Daily Delfin Gant, NP   21 mg at 02/12/14 0811  . ondansetron (ZOFRAN-ODT) disintegrating tablet 4 mg  4 mg Oral Q6H PRN Delfin Gant, NP      . potassium chloride SA (K-DUR,KLOR-CON) CR tablet 20 mEq  20 mEq Oral BH-q7a Delfin Gant, NP   20 mEq at 02/12/14 0636  . QUEtiapine (SEROQUEL) tablet 200 mg  200 mg Oral q morning - 10a [facility]  Harvie Bridge, NP   200 mg at 02/12/14 0907  . thiamine (VITAMIN B-1) tablet 100 mg  100 mg Oral Daily Delfin Gant, NP   100 mg at 02/12/14 0810    Observation Level/Precautions:  15 minute checks  Laboratory:  Reviewed from admission  Psychotherapy:  Yes in past , needs again  Medications:  See mar  Consultations:  As needed  Discharge Concerns:  maintanence  Estimated LOS: 5-7 ddays  Other:     I certify that inpatient services furnished can reasonably be expected to improve the patient's condition.   [place], [place] 6/28/20151:57 PM   I personally assessed the patient, reviewed the physical exam and labs and formulated the treatment plan Geralyn Flash A. Sabra Heck, M.D.

## 2014-02-12 NOTE — Progress Notes (Signed)
Psychoeducational Group Note  Date: 02/12/2014 Time:  0930  Group Topic/Focus:  Gratefulness:  The focus of this group is to help patients identify what two things they are most grateful for in their lives. What helps ground them and to center them on their work to their recovery.  Participation Level:  Active  Participation Quality:  Attentive  Affect:  Appropriate  Cognitive:  Oriented  Insight:  Improving  Engagement in Group:  Engaged  Additional Comments:    [name]

## 2014-02-12 NOTE — Progress Notes (Signed)
Psychoeducational Group Note  Date:  02/12/2014 Time:  1015  Group Topic/Focus:  Making Healthy Choices:   The focus of this group is to help patients identify negative/unhealthy choices they were using prior to admission and identify positive/healthier coping strategies to replace them upon discharge.  Participation Level:  Active  Participation Quality:  Appropriate  Affect:  Appropriate  Cognitive:  Oriented  Insight:  Lacking  Engagement in Group:  Engaged  Additional Comments:    Paulino Rily 02/12/2014

## 2014-02-12 NOTE — Progress Notes (Signed)
[place] Group Notes:  (Nursing/MHT/Case Management/Adjunct)  Date:  02/12/2014  Time:  9:54 PM  Type of Therapy:  Psychoeducational Skills  Participation Level:  Active  Participation Quality:  Appropriate  Affect:  Appropriate  Cognitive:  Appropriate  Insight:  Appropriate  Engagement in Group:  Developing/Improving  Modes of Intervention:  Education  Summary of Progress/Problems: The patient stated that he didn't have a very good day since he didn't sleep very well last night. He claims that his medication has been adjusted and is therefore looking forward to the changes. In terms of the theme for the day, his support system will be comprised of his family, mother, and siblings.   [name] S 02/12/2014, 9:54 PM

## 2014-02-12 NOTE — BHH Group Notes (Signed)
[place] Group Notes:  (Clinical Social Work)  02/12/2014   1:15-2:15PM  Summary of Progress/Problems:  The main focus of today's process group was to   identify the patient's current support system and decide on other supports that can be put in place.  The picture on workbook was used to discuss why additional supports are needed.  An emphasis was placed on using counselor, doctor, therapy groups, 12-step groups, and problem-specific support groups to expand supports.   There was also an extensive discussion about what constitutes a healthy support versus an unhealthy support.  The patient expressed full comprehension of the concepts presented.  The one unhealthy support present currently, which needs to be changed, is his mother.  His brother and sister are willing to help him set healthy boundaries.  Type of Therapy:  Process Group  Participation Level:  Active  Participation Quality:  Attentive and Sharing  Affect:  Blunted and Depressed and Anxious  Cognitive:  Appropriate and Oriented  Insight:  Developing/Improving  Engagement in Therapy:  Improving  Modes of Intervention:  Education,  Support and Processing  Selmer Dominion, LCSW 02/12/2014, 4:00pm

## 2014-02-12 NOTE — Progress Notes (Signed)
D:  Patient's self inventory sheet, patient needs sleep medication, poor sleeping, improving appetite, low energy level, poor attention span.  Rated depression 6, hopeless 2, anxiety 10.  Continues to experience tremors, diarrhea.  SI off/on, contracts for safety.   Experienced headache in past 24 hours.  Pain goal 4, worst pain 2.  After discharge, plans to stay on meds, follow through.  Needs to talk to MD.  No problems with medications after discharge. A:  Medications administered per MD orders.  Emotional support and encouragement given patient. R:  Denied HI.  Patient stated he is SI off/on, contracts for safety.  Continues to hear voices, laughter, people laughing saying bad things about him, sees shadow people.

## 2014-02-12 NOTE — Progress Notes (Signed)
D: Pt denies SI/HI/AVH. Pt is pleasant and cooperative. Pt stated he was self medicating with ETOH , before coming in. Pt said he was hearing voices before coming in, but the Seroquel has been helping decrease voices.  A: Pt was offered support and encouragement. Pt was given scheduled medications. Pt was encourage to attend groups. Q 15 minute checks were done for safety.  R:Pt attends groups and interacts well with peers and staff. Pt is taking medication. Pt has no complaints at this time.Pt receptive to treatment and safety maintained on unit.

## 2014-02-12 NOTE — Progress Notes (Signed)
Report received from Bailey Mech RN. Patient is currently lying in bed resting but not asleep, he voiced no complaints. Safety maintained on unit with 15 min checks.

## 2014-02-12 NOTE — BHH Suicide Risk Assessment (Signed)
Suicide Risk Assessment  Admission Assessment     Nursing information obtained from:  Patient Demographic factors:  Male;Divorced or widowed;Caucasian;Low socioeconomic status;Living alone Current Mental Status:  NA Loss Factors:  NA Historical Factors:  Prior suicide attempts;Family history of mental illness or substance abuse Risk Reduction Factors:  Religious beliefs about death Total Time spent with patient: 45 minutes  CLINICAL FACTORS:   Alcohol/Substance Abuse/Dependencies, Schizoaffective Disorder  Psychiatric Specialty Exam:     Blood pressure 112/75, pulse 62, temperature 98.1 F (36.7 C), temperature source Oral, resp. rate 16, height 5\' 9"  (1.753 m), weight 82.555 kg (182 lb), SpO2 92.00%.Body mass index is 26.86 kg/(m^2).  General Appearance: Fairly Groomed  Engineer, water::  Fair  Speech:  Clear and Coherent, Slow and not spontaneous  Volume:  Decreased  Mood:  Anxious and Depressed  Affect:  Restricted  Thought Process:  Coherent and Goal Directed  Orientation:  Full (Time, Place, and Person)  Thought Content:  symptoms, worries, concerns, wanting to be back on his medications and not have to stay at the hospital too long  Suicidal Thoughts:  No  Homicidal Thoughts:  No  Memory:  Immediate;   Fair Recent;   Fair Remote: fear  Judgement:  Fair  Insight:  Present and Shallow  Psychomotor Activity:  Decreased  Concentration:  Fair  Recall:  [employer] of Knowledge:NA  Language: Fair  Akathisia:  No  Handed:    AIMS (if indicated):     Assets:  Desire for Improvement Housing  Sleep:  Number of Hours: 4.25   Musculoskeletal: Strength & Muscle Tone: within normal limits Gait & Station: normal Patient leans: N/A  COGNITIVE FEATURES THAT CONTRIBUTE TO RISK:  Closed-mindedness Polarized thinking Thought constriction (tunnel vision)    SUICIDE RISK:   Mild:   PLAN OF CARE: Supportive approach/coping skills/relapse prevention  Detox as needed                               Resume medications and slowly build up to the full dose and optimize                                            response  I certify that inpatient services furnished can reasonably be expected to improve the patient's condition.  LUGO,IRVING A 02/12/2014, 3:49 PM

## 2014-02-13 LAB — COMPREHENSIVE METABOLIC PANEL
ALT: 257 U/L — ABNORMAL HIGH (ref 0–53)
AST: 192 U/L — ABNORMAL HIGH (ref 0–37)
Albumin: 3.9 g/dL (ref 3.5–5.2)
Alkaline Phosphatase: 87 U/L (ref 39–117)
BUN: 20 mg/dL (ref 6–23)
CO2: 30 mEq/L (ref 19–32)
Calcium: 9.7 mg/dL (ref 8.4–10.5)
Chloride: 96 mEq/L (ref 96–112)
Creatinine, Ser: 0.92 mg/dL (ref 0.50–1.35)
GFR calc Af Amer: >90 mL/min (ref >90)
GFR calc non Af Amer: >90 mL/min (ref >90)
Glucose, Bld: 85 mg/dL (ref 70–99)
Potassium: 4.7 mEq/L (ref 3.7–5.3)
Sodium: 141 mEq/L (ref 137–147)
Total Bilirubin: 0.5 mg/dL (ref 0.3–1.2)
Total Protein: 6.7 g/dL (ref 6.0–8.3)

## 2014-02-13 MED ORDER — QUETIAPINE FUMARATE 400 MG PO TABS
400.0000 mg | ORAL_TABLET | Freq: Every day | ORAL | Status: DC
  Administered 2014-02-13 – 2014-02-15 (×3): 400 mg via ORAL
  Filled 2014-02-13 (×2): qty 1
  Filled 2014-02-13: qty 2
  Filled 2014-02-13: qty 14
  Filled 2014-02-13 (×2): qty 1

## 2014-02-13 NOTE — Progress Notes (Signed)
Child/Adolescent Psychoeducational Group Note  Date:  02/13/2014 Time:  10:07 PM  Group Topic/Focus:  Wrap-Up Group:   The focus of this group is to help patients review their daily goal of treatment and discuss progress on daily workbooks.  Participation Level:  Active  Participation Quality:  Appropriate and Attentive  Affect:  Appropriate  Cognitive:  Appropriate  Insight:  Appropriate  Engagement in Group:  Engaged  Modes of Intervention:  Discussion  Additional Comments:  Pt attended the wrap up group this evening and remained appropriate and engaged throughout the duration of the group. Pt ranked his day as a 6 because he had a good overall day. Pt shared that one positive thing about his day is the fact that he's leaving Wednesday.  Beryle Beams 02/13/2014, 10:07 PM

## 2014-02-13 NOTE — Progress Notes (Signed)
Patient ID: [name], male   DOB: [birth year], 43 y.o.   MRN: [id number] PER STATE REGULATIONS 482.30  THIS CHART WAS REVIEWED FOR MEDICAL NECESSITY WITH RESPECT TO THE PATIENT'S ADMISSION/ DURATION OF STAY.  NEXT REVIEW DATE: 02/16/2014  Chauncy Lean, RN, BSN CASE MANAGER

## 2014-02-13 NOTE — Progress Notes (Signed)
Pt presents depressed this morning. Pt rates depression 5/10 and hopeless 1/10. Pt reports no AVH today. Pt denies SI/HI. Pt reports tolerating meds well and stated that he usually takes Seroquel 600 mg at HS. Pt compliant with taking meds today. Medications administered as ordered per MD. Verbal support given. Pt encouraged to attend groups. 15 minute checks performed for safety. Pt safety maintained at this time.

## 2014-02-13 NOTE — Progress Notes (Signed)
[facility] MD Progress Note  02/13/2014 4:53 PM [name]  MRN:  [id number]  Subjective:  I still feel really sad and depressed and I still have suicidal thoughts.  Objective Patient seen chart reviewed.  Patient is a 43 year old man who was admitted because of worsening of his symptoms.  He reported hallucination, paranoia, suicidal thoughts.  He is also drinking alcohol his blood alcohol level is 226.  He is noncompliant with his medication and his Tegretol level is less than 3.  His medicines were restarted.  He still had hallucination and seeing shadows and people around him.  Patient lives with his dad.  Diagnosis:   DSM5: Schizophrenia Disorders:  Schizophrenia (295.7) Substance/Addictive Disorders:  Alcohol Related Disorder - Mild (305.00) Total Time spent with patient: 20 minutes  Axis I: Alcohol Abuse and Schizoaffective Disorder Axis II: Deferred Axis III:  Past Medical History  Diagnosis Date  . Depression   . Psychosis   . PTSD (post-traumatic stress disorder)   . Seizure disorder     related to etoh seizure  . Alcohol abuse   . Schizoaffective disorder   . Anxiety     ADL's:  Intact  Sleep: Poor  Appetite:  Fair  Psychiatric Specialty Exam: Physical Exam  ROS  Blood pressure 120/85, pulse 70, temperature 98.3 F (36.8 C), temperature source Oral, resp. rate 16, height 5' 9"  (1.753 m), weight 182 lb (82.555 kg), SpO2 92.00%.Body mass index is 26.86 kg/(m^2).  General Appearance: Disheveled and Guarded  Eye Contact::  Poor  Speech:  Slow  Volume:  Decreased  Mood:  Depressed and Dysphoric  Affect:  Flat  Thought Process:  Circumstantial and Loose  Orientation:  Full (Time, Place, and Person)  Thought Content:  Hallucinations: Auditory Visual and Paranoid Ideation  Suicidal Thoughts:  Yes.  without intent/plan  Homicidal Thoughts:  No  Memory:  Immediate;   Fair Recent;   Fair Remote;   Fair  Judgement:  Impaired  Insight:  Lacking  Psychomotor  Activity:  Decreased  Concentration:  Fair  Recall:  Poor  Fund of Knowledge:Fair  Language: Fair  Akathisia:  No  Handed:  Right  AIMS (if indicated):     Assets:  Desire for Improvement Housing  Sleep:  Number of Hours: 6.75   Musculoskeletal: Strength & Muscle Tone: within normal limits Gait & Station: normal Patient leans: N/A  Current Medications: Current Facility-Administered Medications  Medication Dose Route Frequency Provider Last Rate Last Dose  . acetaminophen (TYLENOL) tablet 650 mg  650 mg Oral Q6H PRN Delfin Gant, NP      . alum & mag hydroxide-simeth (MAALOX/MYLANTA) 200-200-20 MG/5ML suspension 30 mL  30 mL Oral Q4H PRN Delfin Gant, NP      . atenolol (TENORMIN) tablet 50 mg  50 mg Oral Daily Delfin Gant, NP   50 mg at 02/12/14 0808  . carbamazepine (TEGRETOL XR) 12 hr tablet 200 mg  200 mg Oral BID Delfin Gant, NP   200 mg at 02/13/14 0750  . famotidine (PEPCID) tablet 20 mg  20 mg Oral Daily Delfin Gant, NP   20 mg at 02/13/14 0751  . hydrOXYzine (ATARAX/VISTARIL) tablet 25 mg  25 mg Oral Q6H PRN Delfin Gant, NP   25 mg at 02/11/14 2041  . ibuprofen (ADVIL,MOTRIN) tablet 600 mg  600 mg Oral Q8H PRN Delfin Gant, NP      . loperamide (IMODIUM) capsule 2-4 mg  2-4 mg Oral PRN Reginold Agent  Sharlene Motts, NP   2 mg at 02/12/14 1356  . LORazepam (ATIVAN) tablet 1 mg  1 mg Oral Q6H PRN Delfin Gant, NP   1 mg at 02/11/14 2244  . magnesium hydroxide (MILK OF MAGNESIA) suspension 30 mL  30 mL Oral Daily PRN Delfin Gant, NP      . multivitamin with minerals tablet 1 tablet  1 tablet Oral Daily Delfin Gant, NP   1 tablet at 02/13/14 0750  . nicotine (NICODERM CQ - dosed in mg/24 hours) patch 21 mg  21 mg Transdermal Daily Delfin Gant, NP   21 mg at 02/13/14 0751  . ondansetron (ZOFRAN-ODT) disintegrating tablet 4 mg  4 mg Oral Q6H PRN Delfin Gant, NP      . potassium chloride SA (K-DUR,KLOR-CON) CR  tablet 20 mEq  20 mEq Oral BH-q7a Delfin Gant, NP   20 mEq at 02/13/14 0750  . QUEtiapine (SEROQUEL) tablet 200 mg  200 mg Oral q morning - 10a Knox Royalty, NP   200 mg at 02/13/14 1010  . QUEtiapine (SEROQUEL) tablet 200 mg  200 mg Oral QHS Knox Royalty, NP   200 mg at 02/12/14 2203  . sertraline (ZOLOFT) tablet 50 mg  50 mg Oral Daily Nicholaus Bloom, MD   50 mg at 02/13/14 0751  . thiamine (VITAMIN B-1) tablet 100 mg  100 mg Oral Daily Delfin Gant, NP   100 mg at 02/13/14 0751    Lab Results:  Results for orders placed during the hospital encounter of 02/09/14 (from the past 48 hour(s))  CBC WITH DIFFERENTIAL     Status: Abnormal   Collection Time    02/12/14  6:46 AM      Result Value Ref Range   WBC 6.1  4.0 - 10.5 K/uL   RBC 4.67  4.22 - 5.81 MIL/uL   Hemoglobin 15.8  13.0 - 17.0 g/dL   HCT 45.0  39.0 - 52.0 %   MCV 96.4  78.0 - 100.0 fL   MCH 33.8  26.0 - 34.0 pg   MCHC 35.1  30.0 - 36.0 g/dL   RDW 13.7  11.5 - 15.5 %   Platelets 181  150 - 400 K/uL   Neutrophils Relative % 39 (*) 43 - 77 %   Neutro Abs 2.4  1.7 - 7.7 K/uL   Lymphocytes Relative 48 (*) 12 - 46 %   Lymphs Abs 2.9  0.7 - 4.0 K/uL   Monocytes Relative 12  3 - 12 %   Monocytes Absolute 0.8  0.1 - 1.0 K/uL   Eosinophils Relative 1  0 - 5 %   Eosinophils Absolute 0.1  0.0 - 0.7 K/uL   Basophils Relative 0  0 - 1 %   Basophils Absolute 0.0  0.0 - 0.1 K/uL   Comment: Performed at [facility] PANEL     Status: Abnormal   Collection Time    02/12/14  6:46 AM      Result Value Ref Range   Sodium 135 (*) 137 - 147 mEq/L   Potassium 3.6 (*) 3.7 - 5.3 mEq/L   Chloride 88 (*) 96 - 112 mEq/L   CO2 32  19 - 32 mEq/L   Glucose, Bld 105 (*) 70 - 99 mg/dL   BUN 14  6 - 23 mg/dL   Creatinine, Ser 0.83  0.50 - 1.35 mg/dL   Calcium 10.0  8.4 -  10.5 mg/dL   Total Protein 7.2  6.0 - 8.3 g/dL   Albumin 4.3  3.5 - 5.2 g/dL   AST 150 (*) 0 - 37 U/L   ALT 209 (*) 0  - 53 U/L   Alkaline Phosphatase 106  39 - 117 U/L   Total Bilirubin 1.2  0.3 - 1.2 mg/dL   GFR calc non Af Amer >90  >90 mL/min   GFR calc Af Amer >90  >90 mL/min   Comment: (NOTE)     The eGFR has been calculated using the CKD EPI equation.     This calculation has not been validated in all clinical situations.     eGFR's persistently <90 mL/min signify possible Chronic Kidney     Disease.     Performed at [facility]    Physical Findings: AIMS: Facial and Oral Movements Muscles of Facial Expression: None, normal Lips and Perioral Area: None, normal Jaw: None, normal Tongue: None, normal,Extremity Movements Upper (arms, wrists, hands, fingers): None, normal Lower (legs, knees, ankles, toes): None, normal, Trunk Movements Neck, shoulders, hips: None, normal, Overall Severity Severity of abnormal movements (highest score from questions above): None, normal Incapacitation due to abnormal movements: None, normal Patient's awareness of abnormal movements (rate only patient's report): No Awareness, Dental Status Current problems with teeth and/or dentures?: No Does patient usually wear dentures?: No  CIWA:  CIWA-Ar Total: 0 COWS:  COWS Total Score: 2  Treatment Plan Summary: Daily contact with patient to assess and evaluate symptoms and progress in treatment Medication management, patient reported poor sleep and endorsed that he had a good response to the higher dose of Seroquel.  Currently he is taking Seroquel 200 mg twice a day.  We'll increase to 200 mg in the morning and 400 mg at bedtime, continue Tegretol extended release 200 mg twice a day, Continue Zoloft at present dose.  The patient has very high liver enzymes.  We will repeat liver enzymes .  The patient still have tremors and shakes.  Continue Ativan as needed.  Continue to encourage to participate in group milieu therapy.    Medical Decision Making Problem Points:  Established problem, stable/improving  (1), Review of last therapy session (1) and Review of psycho-social stressors (1) Data Points:  Review or order clinical lab tests (1) Review and summation of old records (2) Review of medication regiment & side effects (2) Review of new medications or change in dosage (2)  I certify that inpatient services furnished can reasonably be expected to improve the patient's condition.   Rafi Kenneth T. 02/13/2014, 4:53 PM

## 2014-02-13 NOTE — BHH Group Notes (Signed)
[facility] LCSW Aftercare Discharge Planning Group Note   02/13/2014 10:28 AM    Participation Quality:  Appropraite  Mood/Affect:  Appropriate  Depression Rating:  3  Anxiety Rating:  3  Thoughts of Suicide:  No  Will you contract for safety?   NA  Current AVH:  No  Plan for Discharge/Comments:  Patient attended discharge planning group and actively participated in group.  He reports being much better today.  He stated he is followed by [facility] for outpatient services.  CSW provided all participants with daily workbook.   Transportation Means: Patient has transportation.   Supports:  Patient has a support system.   Hodnett, Eulas Post

## 2014-02-13 NOTE — BHH Group Notes (Signed)
[facility] LCSW Group Therapy          Overcoming Obstacles       1:15 -2:30        02/13/2014   2:12 PM     Type of Therapy:  Group Therapy  Participation Level:  Appropriate  Participation Quality:  Appropriate  Affect:  Appropriate, Alert  Cognitive:  Attentive Appropriate  Insight: Developing/Improving Engaged  Engagement in Therapy: Developing/Imprvoing Engaged  Modes of Intervention:  Discussion Exploration  Education Rapport BuildingProblem-Solving Support  Summary of Progress/Problems:  The main focus of today's group was overcoming obstacles.  He shared fear is the obstacle he has to overcome.  Patient stated he wants to get involved with [facility] but fears going into a group of new people.       Patient able to identify appropriate coping skills.   Concha Pyo 02/13/2014   2:12 PM

## 2014-02-13 NOTE — BHH Suicide Risk Assessment (Signed)
[facility] INPATIENT:  Family/Significant Other Suicide Prevention Education  Suicide Prevention Education:  Patient Refusal for Family/Significant Other Suicide Prevention Education: The patient [name] has refused to provide written consent for family/significant other to be provided Family/Significant Other Suicide Prevention Education during admission and/or prior to discharge.  Physician notified.  Concha Pyo 02/13/2014, 11:35 AM

## 2014-02-14 DIAGNOSIS — F10239 Alcohol dependence with withdrawal, unspecified: Secondary | ICD-10-CM

## 2014-02-14 DIAGNOSIS — F1010 Alcohol abuse, uncomplicated: Secondary | ICD-10-CM

## 2014-02-14 DIAGNOSIS — F10939 Alcohol use, unspecified with withdrawal, unspecified: Secondary | ICD-10-CM

## 2014-02-14 DIAGNOSIS — F1020 Alcohol dependence, uncomplicated: Secondary | ICD-10-CM

## 2014-02-14 DIAGNOSIS — F259 Schizoaffective disorder, unspecified: Principal | ICD-10-CM

## 2014-02-14 DIAGNOSIS — R748 Abnormal levels of other serum enzymes: Secondary | ICD-10-CM | POA: Diagnosis present

## 2014-02-14 NOTE — Progress Notes (Signed)
Patient ID: [name], male   DOB: 1970-10-28, 43 y.o.   MRN: [id number] D   --  Pt. Friendly and cooperative with staff tonight.  He maintains a calm , quiet  But worried affect and interacts well with staff.   He appeares stressed over scheduled tests for in the morning.   He is worried about possible causes for his  elevated liver enzymes.   Pt. Appears to understand  Education provide about nature of tomorrows tests.   He agreed to " fast"  From mid-night in preporation for test in the AM.    Pt. Makes no complaints of pain or dis-comfort .   A  ---  Support, education and meds as ordered.   R ---  Pt. Remain safe on unit

## 2014-02-14 NOTE — Consult Note (Signed)
[facility] Medical Consultation  [name] SLP:[id number] DOB: Oct 29, 1970 DOA: 02/09/2014 PCP: No primary provider on file.   Requesting physician: Dr. Adele Schilder Date of consultation: 02/14/14 Reason for consultation: Elevated LFT's  Impression/Recommendations Active Problems:   Alcohol dependence   Schizoaffective disorder   Abnormal liver enzymes  1. Elevated liver enzymes 1. AST, ALT, and Alk Phos elevated since admission, suggesting biliary obstructive disease 2. Liver US ordered, will follow results 3. Will check acute hepatitis panel 4. Pt noted to have ETOH of 226 on admit, although ratio of AST/ALT not suggestive of acute alcoholic hepatitis 5. May consider decreasing or stopping tegretol, seroquel, and zoloft as they may affect liver function - will defer medication changes to primary service 2. Schizoaffective disorder 1. Per primary service  I will followup again tomorrow. Please contact me if I can be of assistance in the meanwhile. Thank you for this consultation.  Chief Complaint: "I feel fine."  HPI:  43yo male with a hx of depression , etoh abuse, anxiety, and schizoaffective disorder admitted to the behavioral service for treatment of alcohol abuse and sx related to schizoaffective disorder. On admit, the patient was noted to have a presenting AST, ALT, and Alk Phos of 339, 332, and 135, respectively. Peak total bili of 1.3. Pt was continued on Zoloft, Seroquel, and tegretol. Hospitalist service was consulted for further work up of elevated LFT's  Review of Systems:  Per above, the remainder of the 10pt ros reviewed and are neg  Past Medical History  Diagnosis Date  . Depression   . Psychosis   . PTSD (post-traumatic stress disorder)   . Seizure disorder     related to etoh seizure  . Alcohol abuse   . Schizoaffective disorder   . Anxiety    Past Surgical History  Procedure Laterality Date  . Foot surgery      right  . Hand surgery     Social  History:  reports that he has been smoking Cigarettes.  He has a 36 pack-year smoking history. He has never used smokeless tobacco. He reports that he drinks about 50.4 ounces of alcohol per week. He reports that he uses illicit drugs (Marijuana) about once per week.  No Known Allergies History reviewed. No pertinent family history.  Prior to Admission medications   Medication Sig Start Date End Date Taking? Authorizing Provider  carbamazepine (TEGRETOL XR) 200 MG 12 hr tablet Take 1 tablet (200 mg total) by mouth 2 (two) times daily. 01/05/14   Elmarie Shiley, NP  doxepin (SINEQUAN) 50 MG capsule Take 1 capsule (50 mg total) by mouth at bedtime. 01/05/14   Elmarie Shiley, NP  prazosin (MINIPRESS) 5 MG capsule Take 1 capsule (5 mg total) by mouth at bedtime. 01/05/14   Elmarie Shiley, NP  ranitidine (ZANTAC) 75 MG tablet Take 75 mg by mouth daily as needed for heartburn.    Historical Provider, MD  sertraline (ZOLOFT) 100 MG tablet Take 2 tablets (200 mg total) by mouth daily. 01/05/14   Elmarie Shiley, NP   Physical Exam: Blood pressure 150/103, pulse 70, temperature 96.9 F (36.1 C), temperature source Oral, resp. rate 16, height _0  (1.753 m), weight 182 lb (82.555 kg), SpO2 92.00%. Filed Vitals:   02/13/14 0700 02/13/14 0701 02/14/14 0551 02/14/14 0552  BP: 116/82 120/85 151/98 150/103  Pulse: 65 70 57 70  Temp: 98.3 F (36.8 C)  96.9 F (36.1 C)   TempSrc: Oral  Oral   Resp: 16  16  Height:      Weight:      SpO2:         General:  Awake, in nad  Eyes: PERRL B  ENT: Membranes moist, dentition fair  Neck: trachea midline, neck supple  Cardiovascular: regular, s1, s2  Respiratory: normal resp effort, on wheezing  Abdomen: soft, nondistended  Skin: normal skin turgor, no abnormal skin lesions seen  Musculoskeletal: perfused, no clubbing  Psychiatric: mood/affect normal // no auditory/visual hallucinations  Neurologic: cn2-12 grossly intact, strength/sensation intact  Labs on  Admission:  Basic Metabolic Panel:  Recent Labs Lab 02/09/14 0500 02/10/14 0650 02/11/14 0636 02/12/14 0646 02/13/14 2004  NA 129* 132* 133* 135* 141  K 2.9* 3.1* 3.1* 3.6* 4.7  CL 76* 81* 86* 88* 96  CO2 29 32 33* 32 30  GLUCOSE 81 111* 91 105* 85  BUN _0 CREATININE 0.61 0.76 0.79 0.83 0.92  CALCIUM 9.5 10.4 10.1 10.0 9.7  MG  --  2.3  --   --   --    Liver Function Tests:  Recent Labs Lab 02/09/14 0500 02/10/14 0650 02/11/14 0636 02/12/14 0646 02/13/14 2004  AST 339* 211* 145* 150* 192*  ALT 332* 257* 208* 209* 257*  ALKPHOS 135* 129* 113 106 87  BILITOT 0.8 1.3* 1.0 1.2 0.5  PROT 7.9 7.5 7.4 7.2 6.7  ALBUMIN 4.8 4.5 4.1 4.3 3.9    Recent Labs Lab 02/10/14 0650 02/11/14 0636  LIPASE 65* 77*   No results found for this basename: AMMONIA,  in the last 168 hours CBC:  Recent Labs Lab 02/09/14 0500 02/12/14 0646  WBC 3.6* 6.1  NEUTROABS 1.1* 2.4  HGB 16.6 15.8  HCT 46.0 45.0  MCV 92.6 96.4  PLT 172 181   Cardiac Enzymes:  Recent Labs Lab 02/10/14 0650  CKTOTAL 121  CKMB <1.0   BNP: No components found with this basename: POCBNP,  CBG: No results found for this basename: GLUCAP,  in the last 168 hours  Radiological Exams on Admission: No results found.  Time spent: 97mn  [name] K [facility] Pager [phone] If 7PM-7AM, please contact night-coverage [email or web address] Password [email or web address] 02/14/2014, 5:05 PM

## 2014-02-14 NOTE — Progress Notes (Signed)
Pt will need to be NPO starting at 12 am until ultrasound appt. Pt ultrasound appt is scheduled for 930 am on 02/15/14 at [facility]. Pt must arrive 15 minutes early for registration. Please confirm transportation with [facility] at 0800 am. Betsy Pries will pick pt up at 0850 am.

## 2014-02-14 NOTE — Progress Notes (Signed)
Pt presents anxious this morning. Pt reports poor sleep last night. Pt denies SI/HI/AVH today. Pt compliant with taking meds and attending groups. Pt plan to return home once he is discharged. Pt reports that he is tolerating his meds well. Medications administered as ordered per MD. Verbal support given. Pt encouraged to attend groups. 15 minute checks performed for safety. Pt safety maintained at this time.

## 2014-02-14 NOTE — Progress Notes (Signed)
Recreation Therapy Notes  Animal-Assisted Activity/Therapy (AAA/T) Program Checklist/Progress Notes Patient Eligibility Criteria Checklist & Daily Group note for Rec Tx Intervention  Date: 06.30.2015 Time: 3:15pm Location: [address]    AAA/T Program Assumption of Risk Form signed by Patient/ or Parent Legal Guardian yes  Patient is free of allergies or sever asthma yes  Patient reports no fear of animals yes  Patient reports no history of cruelty to animals yes   Patient understands his/her participation is voluntary yes  Patient washes hands before animal contact yes  Patient washes hands after animal contact yes  Behavioral Response: Appropriate   Education: Hand Washing, Appropriate Animal Interaction   Education Outcome: Acknowledges understanding   Clinical Observations/Feedback: Patient interacted appropriately with therapeutic dog team and peers during session.   Laureen Ochs [place], LRT/CTRS        Lane Hacker 02/14/2014 4:37 PM

## 2014-02-14 NOTE — Progress Notes (Deleted)
Recreation Therapy Notes  Animal-Assisted Activity/Therapy (AAA/T) Program Checklist/Progress Notes Patient Eligibility Criteria Checklist & Daily Group note for Rec Tx Intervention  Date: 06.30.2015 Time: 3:15pm Location: [address]    AAA/T Program Assumption of Risk Form signed by Patient/ or Parent Legal Guardian yes  Patient is free of allergies or sever asthma yes  Patient reports no fear of animals yes  Patient reports no history of cruelty to animals yes   Patient understands his/her participation is voluntary yes  Behavioral Response: Did not attend.   Laureen Ochs [place], LRT/CTRS  Lane Hacker 02/14/2014 4:36 PM

## 2014-02-14 NOTE — Tx Team (Signed)
Interdisciplinary Treatment Plan Update   Date Reviewed:  02/14/2014  Time Reviewed:  8:41 AM  Progress in Treatment:   Attending groups: Yes Participating in groups: Yes Taking medication as prescribed: Yes  Tolerating medication: Yes Family/Significant other contact made:  No, patient declined collateral contact Patient understands diagnosis: Yes  Discussing patient identified problems/goals with staff: Yes Medical problems stabilized or resolved: Yes Denies suicidal/homicidal ideation: Yes Patient has not harmed self or others: Yes  For review of initial/current patient goals, please see plan of care.  Estimated Length of Stay:  1-2 days  Reasons for Continued Hospitalization:  Anxiety Depression Medication stabilization   New Problems/Goals identified:    Discharge Plan or Barriers:   Home with outpatient follow up with [facility]  Additional Comments:  43 yo male presently voluntarily to [facility] requesting treatment for alcohol abuse and symptoms related to his diagnosis of schizoaffective disorder. He has been drinking 6-8 "tall boys" daily. He was hospitalized/BH approximately one month ago on Geodon and he stopped taking it because it doesn't work. He now reports VH of seeing "shadow people" (tall and black) and AH He hears them laughing at him. "It is scary"  Reports he was stable in past on Seroquel 200 mg q am/Seroquesl 600 mg qhs--will initiated and will need titration  Tegretol ER 200 mg every 12 hrs.  He has fleeting SI and contracts for safety Denies HI.   Attendees:  Patient:  02/14/2014 8:41 AM   Signature:  S. Arfeen, MD 02/14/2014 8:41 AM  Signature:  02/14/2014 8:41 AM  Signature:  Darrol Angel, RN 02/14/2014 8:41 AM  Signature:Beverly Danelle Earthly, RN 02/14/2014 8:41 AM  Signature:   02/14/2014 8:41 AM  Signature:  Joette Catching, LCSW 02/14/2014 8:41 AM  Signature:  Regan Lemming, LCSW 02/14/2014 8:41 AM  Signature:  Lucinda Dell, Care Coordinator [facility] 02/14/2014  8:41 AM  Signature:   02/14/2014 8:41 AM  Signature: Marilynne Halsted, RN 02/14/2014  8:41 AM  Signature:    02/14/2014  8:41 AM  Signature: 02/14/2014  8:41 AM    Scribe for Treatment Team:   Joette Catching,  02/14/2014 8:41 AM

## 2014-02-14 NOTE — Progress Notes (Signed)
[facility] MD Progress Note  02/14/2014 2:56 PM [name]  MRN:  [id number]  Subjective:  I am very anxious and depressed.  I cannot sleep last night.  I'm thinking about my health issues.  Objective Patient seen chart reviewed.  Patient remained very depressed anxious and he continued to endorse hallucination and passive suicidal thoughts.  He has difficulty falling asleep.  We increased his Seroquel dosage at nighttime.  He noticed some improvement but he is still complaining of hopelessness and worthlessness.  I reviewed his liver enzymes.  His increased from the past.  We will contact the internal medicine for further workup.  Continue his current psychotropic medication.  Encouraged to participate in group therapy.  Diagnosis:   DSM5: Schizophrenia Disorders:  Schizophrenia (295.7) Substance/Addictive Disorders:  Alcohol Related Disorder - Mild (305.00) Total Time spent with patient: 20 minutes  Axis I: Alcohol Abuse and Schizoaffective Disorder Axis II: Deferred Axis III:  Past Medical History  Diagnosis Date  . Depression   . Psychosis   . PTSD (post-traumatic stress disorder)   . Seizure disorder     related to etoh seizure  . Alcohol abuse   . Schizoaffective disorder   . Anxiety     ADL's:  Intact  Sleep: Poor  Appetite:  Fair  Psychiatric Specialty Exam: Physical Exam  ROS  Blood pressure 150/103, pulse 70, temperature 96.9 F (36.1 C), temperature source Oral, resp. rate 16, height 5' 9"  (1.753 m), weight 182 lb (82.555 kg), SpO2 92.00%.Body mass index is 26.86 kg/(m^2).  General Appearance: Disheveled and Guarded  Eye Contact::  Poor  Speech:  Slow  Volume:  Decreased  Mood:  Depressed and Dysphoric  Affect:  Flat  Thought Process:  Circumstantial and Loose  Orientation:  Full (Time, Place, and Person)  Thought Content:  Hallucinations: Auditory Visual and Paranoid Ideation  Suicidal Thoughts:  Yes.  without intent/plan  Homicidal Thoughts:  No   Memory:  Immediate;   Fair Recent;   Fair Remote;   Fair  Judgement:  Impaired  Insight:  Lacking  Psychomotor Activity:  Decreased  Concentration:  Fair  Recall:  Poor  Fund of Knowledge:Fair  Language: Fair  Akathisia:  No  Handed:  Right  AIMS (if indicated):     Assets:  Desire for Improvement Housing  Sleep:  Number of Hours: 6.75   Musculoskeletal: Strength & Muscle Tone: within normal limits Gait & Station: normal Patient leans: N/A  Current Medications: Current Facility-Administered Medications  Medication Dose Route Frequency Catalaya Garr Last Rate Last Dose  . alum & mag hydroxide-simeth (MAALOX/MYLANTA) 200-200-20 MG/5ML suspension 30 mL  30 mL Oral Q4H PRN Delfin Gant, NP      . atenolol (TENORMIN) tablet 50 mg  50 mg Oral Daily Delfin Gant, NP   50 mg at 02/14/14 0744  . carbamazepine (TEGRETOL XR) 12 hr tablet 200 mg  200 mg Oral BID Delfin Gant, NP   200 mg at 02/14/14 0745  . famotidine (PEPCID) tablet 20 mg  20 mg Oral Daily Delfin Gant, NP   20 mg at 02/14/14 0745  . hydrOXYzine (ATARAX/VISTARIL) tablet 25 mg  25 mg Oral Q6H PRN Delfin Gant, NP   25 mg at 02/11/14 2041  . ibuprofen (ADVIL,MOTRIN) tablet 600 mg  600 mg Oral Q8H PRN Delfin Gant, NP      . loperamide (IMODIUM) capsule 2-4 mg  2-4 mg Oral PRN Delfin Gant, NP   2 mg at  02/12/14 1356  . magnesium hydroxide (MILK OF MAGNESIA) suspension 30 mL  30 mL Oral Daily PRN Delfin Gant, NP      . multivitamin with minerals tablet 1 tablet  1 tablet Oral Daily Delfin Gant, NP   1 tablet at 02/14/14 0744  . nicotine (NICODERM CQ - dosed in mg/24 hours) patch 21 mg  21 mg Transdermal Daily Delfin Gant, NP   21 mg at 02/14/14 0744  . ondansetron (ZOFRAN-ODT) disintegrating tablet 4 mg  4 mg Oral Q6H PRN Delfin Gant, NP      . potassium chloride SA (K-DUR,KLOR-CON) CR tablet 20 mEq  20 mEq Oral BH-q7a Delfin Gant, NP   20 mEq at 02/14/14  0744  . QUEtiapine (SEROQUEL) tablet 200 mg  200 mg Oral q morning - 10a Knox Royalty, NP   200 mg at 02/14/14 1011  . QUEtiapine (SEROQUEL) tablet 400 mg  400 mg Oral QHS Kathlee Nations, MD   400 mg at 02/13/14 2136  . sertraline (ZOLOFT) tablet 50 mg  50 mg Oral Daily Nicholaus Bloom, MD   50 mg at 02/14/14 0745  . thiamine (VITAMIN B-1) tablet 100 mg  100 mg Oral Daily Delfin Gant, NP   100 mg at 02/14/14 0744    Lab Results:  Results for orders placed during the hospital encounter of 02/09/14 (from the past 48 hour(s))  COMPREHENSIVE METABOLIC PANEL     Status: Abnormal   Collection Time    02/13/14  8:04 PM      Result Value Ref Range   Sodium 141  137 - 147 mEq/L   Potassium 4.7  3.7 - 5.3 mEq/L   Comment: DELTA CHECK NOTED     NO VISIBLE HEMOLYSIS     REPEATED TO VERIFY   Chloride 96  96 - 112 mEq/L   Comment: DELTA CHECK NOTED     REPEATED TO VERIFY   CO2 30  19 - 32 mEq/L   Glucose, Bld 85  70 - 99 mg/dL   BUN 20  6 - 23 mg/dL   Creatinine, Ser 0.92  0.50 - 1.35 mg/dL   Calcium 9.7  8.4 - 10.5 mg/dL   Total Protein 6.7  6.0 - 8.3 g/dL   Albumin 3.9  3.5 - 5.2 g/dL   AST 192 (*) 0 - 37 U/L   ALT 257 (*) 0 - 53 U/L   Alkaline Phosphatase 87  39 - 117 U/L   Total Bilirubin 0.5  0.3 - 1.2 mg/dL   GFR calc non Af Amer >90  >90 mL/min   GFR calc Af Amer >90  >90 mL/min   Comment: (NOTE)     The eGFR has been calculated using the CKD EPI equation.     This calculation has not been validated in all clinical situations.     eGFR's persistently <90 mL/min signify possible Chronic Kidney     Disease.     Performed at [facility]    Physical Findings: AIMS: Facial and Oral Movements Muscles of Facial Expression: None, normal Lips and Perioral Area: None, normal Jaw: None, normal Tongue: None, normal,Extremity Movements Upper (arms, wrists, hands, fingers): None, normal Lower (legs, knees, ankles, toes): None, normal, Trunk Movements Neck,  shoulders, hips: None, normal, Overall Severity Severity of abnormal movements (highest score from questions above): None, normal Incapacitation due to abnormal movements: None, normal Patient's awareness of abnormal movements (rate only patient's report): No  Awareness, Dental Status Current problems with teeth and/or dentures?: No Does patient usually wear dentures?: No  CIWA:  CIWA-Ar Total: 2 COWS:  COWS Total Score: 2  Treatment Plan Summary: Daily contact with patient to assess and evaluate symptoms and progress in treatment Medication management, continue Seroquel 200 mg in the morning and 400 mg at bedtime. continue Tegretol extended release 200 mg twice a day, Continue Zoloft at present dose.  His liver enzymes remain very high.  Contact internal medicine consult.  Continue Ativan as needed.  Continue to encourage to participate in group milieu therapy.    Medical Decision Making Problem Points:  Established problem, stable/improving (1), Review of last therapy session (1) and Review of psycho-social stressors (1) Data Points:  Review or order clinical lab tests (1) Review and summation of old records (2) Review of medication regiment & side effects (2) Review of new medications or change in dosage (2)  I certify that inpatient services furnished can reasonably be expected to improve the patient's condition.   ARFEEN,SYED T. 02/14/2014, 2:56 PM

## 2014-02-14 NOTE — BHH Group Notes (Signed)
[facility] LCSW Group Therapy      Feelings About Diagnosis 1:15 - 2:30 PM         02/14/2014    Type of Therapy:  Group Therapy  Participation Level:  Active  Participation Quality:  Appropriate  Affect:  Appropriate  Cognitive:  Alert and Appropriate  Insight:  Developing/Improving and Engaged  Engagement in Therapy:  Developing/Improving and Engaged  Modes of Intervention:  Discussion, Education, Exploration, Problem-Solving, Rapport Building, Support  Summary of Progress/Problems:  Patient actively participated in group. Patient discussed past and present diagnosis and the effects it has had on  life.  Patient talked about family and society being judgmental and the stigma associated with having a mental health diagnosis.  He talked about the relationship between Major Depression and Schizoaffective Disorder and how they feed on each other.  Patient stated he plans to follow up with [facility] for further support at discharge.  Concha Pyo 02/14/2014

## 2014-02-15 ENCOUNTER — Ambulatory Visit (HOSPITAL_COMMUNITY)
Admit: 2014-02-15 | Discharge: 2014-02-15 | Disposition: A | Discharge status: Home / Self Care | Payer: Medicare Other | Attending: Internal Medicine | Admitting: Internal Medicine

## 2014-02-15 DIAGNOSIS — K7689 Other specified diseases of liver: Secondary | ICD-10-CM | POA: Diagnosis not present

## 2014-02-15 LAB — COMPREHENSIVE METABOLIC PANEL
ALT: 249 U/L — ABNORMAL HIGH (ref 0–53)
AST: 129 U/L — ABNORMAL HIGH (ref 0–37)
Albumin: 3.8 g/dL (ref 3.5–5.2)
Alkaline Phosphatase: 84 U/L (ref 39–117)
BUN: 15 mg/dL (ref 6–23)
CO2: 27 mEq/L (ref 19–32)
Calcium: 9.7 mg/dL (ref 8.4–10.5)
Chloride: 103 mEq/L (ref 96–112)
Creatinine, Ser: 0.78 mg/dL (ref 0.50–1.35)
GFR calc Af Amer: >90 mL/min (ref >90)
GFR calc non Af Amer: >90 mL/min (ref >90)
Glucose, Bld: 80 mg/dL (ref 70–99)
Potassium: 5.4 mEq/L — ABNORMAL HIGH (ref 3.7–5.3)
Sodium: 141 mEq/L (ref 137–147)
Total Bilirubin: 0.5 mg/dL (ref 0.3–1.2)
Total Protein: 6.7 g/dL (ref 6.0–8.3)

## 2014-02-15 LAB — HEPATITIS PANEL, ACUTE
HCV Ab: NEGATIVE
Hep A IgM: NONREACTIVE
Hep B C IgM: NONREACTIVE
Hepatitis B Surface Ag: NEGATIVE

## 2014-02-15 NOTE — BHH Group Notes (Signed)
[facility] LCSW Group Therapy  Emotional Regulation 1:15 - 2: 30 PM        02/15/2014     Type of Therapy:  Group Therapy  Participation Level:  Appropriate  Participation Quality:  Appropriate  Affect:  Appropriate  Cognitive:  Attentive Appropriate  Insight:  Developing/Improving Engaged  Engagement in Therapy:  Developing/Improving Engaged  Modes of Intervention:  Discussion Exploration Problem-Solving Supportive  Summary of Progress/Problems:  Group topic was emotional regulations.  Patient participated in the discussion and was able to identify fear as the emotion that needs to regulated.  Patient was able to identify approprite coping skills.  [name] 02/15/2014

## 2014-02-15 NOTE — BHH Group Notes (Signed)
[facility] Group Notes:  (Nursing/MHT/Case Management/Adjunct)  Date:  02/15/2014  Time:  1:22 PM  Type of Therapy:  Psychoeducational Skills  Participation Level:  Active  Participation Quality:  Attentive  Affect:  Appropriate  Cognitive:  Appropriate  Insight:  Appropriate  Engagement in Group:  Engaged  Modes of Intervention:  Activity  Summary of Progress/Problems: Pt. attended group and participated in group activity. Pt. answered questions about personal development and identified coping skills and support system.  Laure Kidney 9/0/3833, 3:83 PM

## 2014-02-15 NOTE — Progress Notes (Signed)
    We were asked to see this patient yesterday in reference to his transaminitis. See Dr. Rhetta Mura consult note for further details. Abdominal US with hepatic steatosis that likely explains his elevated LFTs in conjunction with ETOH use. Hepatitis panel is negative. LFTs are slightly decreased today. He is also on several psychotropic meds that can certainly raise LFTs as well. Feel no need for further intervention at this point. Weight loss and ETOH cessation are mainframe of treatment. Will sign off. Please call us back if further questions arise.   Domingo Mend, MD [facility] Pager: [phone]

## 2014-02-15 NOTE — Progress Notes (Signed)
[facility] Group Notes:  (Nursing/MHT/Case Management/Adjunct)  Date:  02/15/2014  Time:  2:29 AM  Type of Therapy:  Group Therapy  Participation Level:  Active  Participation Quality:  Appropriate  Affect:  Appropriate  Cognitive:  Alert  Insight:  Appropriate  Engagement in Group:  Engaged  Modes of Intervention:  Discussion  Summary of Progress/Problems: Patient stated that he had a rough day today and that he was worried about his medical procedure scheduled for tomorrow. The positive thing that happened was that he talked to his RN and he helped calm him down.  Donney Rankins 02/15/2014, 2:29 AM

## 2014-02-15 NOTE — Progress Notes (Signed)
Adult Psychoeducational Group Note  Date:  02/15/2014 Time:  10:45 PM  Group Topic/Focus:  Wrap-Up Group:   The focus of this group is to help patients review their daily goal of treatment and discuss progress on daily workbooks.  Participation Level:  Active  Participation Quality:  Appropriate  Affect:  Appropriate  Cognitive:  Appropriate  Insight: Appropriate  Engagement in Group:  Engaged  Modes of Intervention:  Discussion  Additional Comments:The  patient  expressed that he learned the difference between happiness and contentment.  [name] 02/15/2014, 10:45 PM

## 2014-02-15 NOTE — Progress Notes (Signed)
Patient seemed excited about his pending discharge tomorrow. His mood and affects appropriate. Patient reported that he had a descent  Day, happy that his test result came out negative, "I was worried about that, but I'm happy everything came back normal". He stated that he planned to stop self medicating with alcohol and be compliant with his home med. "As long as I stay on my med I don't drink". Writer encouraged patient to take his med and ordered when he gets home and stay away from alcohol. Patient receptive to encouragement and support. Q 15 minute check continues as ordered to maintain safety.

## 2014-02-15 NOTE — Progress Notes (Signed)
Scheduled morning meds held per MD. Pt NPO for scheduled ultrasound at 0930 today. Writer will administer 0800 am meds when ultrasound is completed.

## 2014-02-15 NOTE — Progress Notes (Addendum)
Pt reports decreased depression today. Pt denies AVH today. Pt rates depression 5/10 and hopeless 2/10. Pt denies SI/HI. Pt compliant with taking meds attending groups. No complaints verbalized by pt at this time. Pt discharge plans are to stay on meds, no drinking alcohol, attend support groups, and go to [place].  Medications administered as ordered per MD. K+ d/c'd per MD, pt k+ level 5.4. Pt sent to [facility] for Ultrasound. Verbal support given. Pt encouraged to attend groups. 15 minute checks performed for safety. Pt safety maintained at this time.

## 2014-02-15 NOTE — Progress Notes (Signed)
[facility] MD Progress Note  02/15/2014 2:15 PM Havard Radigan  MRN:  [id number]  Subjective:  I am very worried about my liver enzymes.  I cannot sleep last night.  Objective Patient seen chart reviewed.  Patient remained very anxious about his health issues.  He had ultrasound this morning.  His liver enzymes are still high but numbers are coming down.  He was seen by internal medicine and he was recommended ultrasound.  He also discussed about lowering his medication but patient is very anxious about going to dose.  He is feeling better with a current dose of medication.  He is less depressed and he denies any hallucination in past 24 hours.  He has no tremors.  He realized that he can stop drinking which may be contributing to his high liver enzymes.  He still feels very anxious and he continues to have feelings of hopelessness.  However overall his mood has been improved from the past.  He denies any other side effects of medication.  He had a hepatitis panel which was normal.  Diagnosis:   DSM5: Schizophrenia Disorders:  Schizophrenia (295.7) Substance/Addictive Disorders:  Alcohol Related Disorder - Mild (305.00) Total Time spent with patient: 20 minutes  Axis I: Alcohol Abuse and Schizoaffective Disorder Axis II: Deferred Axis III:  Past Medical History  Diagnosis Date  . Depression   . Psychosis   . PTSD (post-traumatic stress disorder)   . Seizure disorder     related to etoh seizure  . Alcohol abuse   . Schizoaffective disorder   . Anxiety     ADL's:  Intact  Sleep: Poor  Appetite:  Fair  Psychiatric Specialty Exam: Physical Exam  ROS  Blood pressure 136/88, pulse 71, temperature 97.3 F (36.3 C), temperature source Oral, resp. rate 16, height _0  (1.753 m), weight 182 lb (82.555 kg), SpO2 92.00%.Body mass index is 26.86 kg/(m^2).  General Appearance: Disheveled and Guarded  Eye Contact::  Poor  Speech:  Slow  Volume:  Decreased  Mood:  Depressed and Dysphoric   Affect:  Flat  Thought Process:  Circumstantial and Loose  Orientation:  Full (Time, Place, and Person)  Thought Content:  Hallucinations: Auditory  Suicidal Thoughts:  No  Homicidal Thoughts:  No  Memory:  Immediate;   Fair Recent;   Fair Remote;   Fair  Judgement:  Impaired  Insight:  Lacking  Psychomotor Activity:  Decreased  Concentration:  Fair  Recall:  Poor  Fund of Knowledge:Fair  Language: Fair  Akathisia:  No  Handed:  Right  AIMS (if indicated):     Assets:  Desire for Improvement Housing  Sleep:  Number of Hours: 6.5   Musculoskeletal: Strength & Muscle Tone: within normal limits Gait & Station: normal Patient leans: N/A  Current Medications: Current Facility-Administered Medications  Medication Dose Route Frequency Provider Last Rate Last Dose  . alum & mag hydroxide-simeth (MAALOX/MYLANTA) 200-200-20 MG/5ML suspension 30 mL  30 mL Oral Q4H PRN Delfin Gant, NP      . atenolol (TENORMIN) tablet 50 mg  50 mg Oral Daily Delfin Gant, NP   50 mg at 02/15/14 0943  . carbamazepine (TEGRETOL XR) 12 hr tablet 200 mg  200 mg Oral BID Delfin Gant, NP   200 mg at 02/15/14 0943  . famotidine (PEPCID) tablet 20 mg  20 mg Oral Daily Delfin Gant, NP   20 mg at 02/15/14 0943  . ibuprofen (ADVIL,MOTRIN) tablet 600 mg  600 mg Oral  Q8H PRN Delfin Gant, NP      . magnesium hydroxide (MILK OF MAGNESIA) suspension 30 mL  30 mL Oral Daily PRN Delfin Gant, NP      . multivitamin with minerals tablet 1 tablet  1 tablet Oral Daily Delfin Gant, NP   1 tablet at 02/15/14 0942  . nicotine (NICODERM CQ - dosed in mg/24 hours) patch 21 mg  21 mg Transdermal Daily Delfin Gant, NP   21 mg at 02/15/14 0943  . QUEtiapine (SEROQUEL) tablet 200 mg  200 mg Oral q morning - 10a Knox Royalty, NP   200 mg at 02/15/14 0942  . QUEtiapine (SEROQUEL) tablet 400 mg  400 mg Oral QHS Kathlee Nations, MD   400 mg at 02/14/14 2144  . sertraline (ZOLOFT)  tablet 50 mg  50 mg Oral Daily Nicholaus Bloom, MD   50 mg at 02/15/14 0943  . thiamine (VITAMIN B-1) tablet 100 mg  100 mg Oral Daily Delfin Gant, NP   100 mg at 02/15/14 0947    Lab Results:  Results for orders placed during the hospital encounter of 02/09/14 (from the past 48 hour(s))  COMPREHENSIVE METABOLIC PANEL     Status: Abnormal   Collection Time    02/13/14  8:04 PM      Result Value Ref Range   Sodium 141  137 - 147 mEq/L   Potassium 4.7  3.7 - 5.3 mEq/L   Comment: DELTA CHECK NOTED     NO VISIBLE HEMOLYSIS     REPEATED TO VERIFY   Chloride 96  96 - 112 mEq/L   Comment: DELTA CHECK NOTED     REPEATED TO VERIFY   CO2 30  19 - 32 mEq/L   Glucose, Bld 85  70 - 99 mg/dL   BUN 20  6 - 23 mg/dL   Creatinine, Ser 0.92  0.50 - 1.35 mg/dL   Calcium 9.7  8.4 - 10.5 mg/dL   Total Protein 6.7  6.0 - 8.3 g/dL   Albumin 3.9  3.5 - 5.2 g/dL   AST 192 (*) 0 - 37 U/L   ALT 257 (*) 0 - 53 U/L   Alkaline Phosphatase 87  39 - 117 U/L   Total Bilirubin 0.5  0.3 - 1.2 mg/dL   GFR calc non Af Amer >90  >90 mL/min   GFR calc Af Amer >90  >90 mL/min   Comment: (NOTE)     The eGFR has been calculated using the CKD EPI equation.     This calculation has not been validated in all clinical situations.     eGFR's persistently <90 mL/min signify possible Chronic Kidney     Disease.     Performed at [place] [facility], ACUTE     Status: None   Collection Time    02/14/14  8:06 PM      Result Value Ref Range   Hepatitis B Surface Ag NEGATIVE  NEGATIVE   HCV Ab NEGATIVE  NEGATIVE   Hep A IgM NON REACTIVE  NON REACTIVE   Hep B C IgM NON REACTIVE  NON REACTIVE   Comment: (NOTE)     High levels of Hepatitis B Core IgM antibody are detectable     during the acute stage of Hepatitis B. This antibody is used     to differentiate current from past HBV infection.     Performed at [facility]  COMPREHENSIVE METABOLIC PANEL     Status: Abnormal    Collection Time    02/15/14  6:18 AM      Result Value Ref Range   Sodium 141  137 - 147 mEq/L   Potassium 5.4 (*) 3.7 - 5.3 mEq/L   Chloride 103  96 - 112 mEq/L   CO2 27  19 - 32 mEq/L   Glucose, Bld 80  70 - 99 mg/dL   BUN 15  6 - 23 mg/dL   Creatinine, Ser 0.78  0.50 - 1.35 mg/dL   Calcium 9.7  8.4 - 10.5 mg/dL   Total Protein 6.7  6.0 - 8.3 g/dL   Albumin 3.8  3.5 - 5.2 g/dL   AST 129 (*) 0 - 37 U/L   ALT 249 (*) 0 - 53 U/L   Alkaline Phosphatase 84  39 - 117 U/L   Total Bilirubin 0.5  0.3 - 1.2 mg/dL   GFR calc non Af Amer >90  >90 mL/min   GFR calc Af Amer >90  >90 mL/min   Comment: (NOTE)     The eGFR has been calculated using the CKD EPI equation.     This calculation has not been validated in all clinical situations.     eGFR's persistently <90 mL/min signify possible Chronic Kidney     Disease.     Performed at [facility]    Physical Findings: AIMS: Facial and Oral Movements Muscles of Facial Expression: None, normal Lips and Perioral Area: None, normal Jaw: None, normal Tongue: None, normal,Extremity Movements Upper (arms, wrists, hands, fingers): None, normal Lower (legs, knees, ankles, toes): None, normal, Trunk Movements Neck, shoulders, hips: None, normal, Overall Severity Severity of abnormal movements (highest score from questions above): None, normal Incapacitation due to abnormal movements: None, normal Patient's awareness of abnormal movements (rate only patient's report): No Awareness, Dental Status Current problems with teeth and/or dentures?: No Does patient usually wear dentures?: No  CIWA:  CIWA-Ar Total: 0 COWS:  COWS Total Score: 2  Treatment Plan Summary: Daily contact with patient to assess and evaluate symptoms and progress in treatment Medication management, I have discussed his medication dosage and side effects , patient does not want to change his psychotropic medication.  Continue Seroquel 200 mg in the morning and  400 mg at bedtime. continue Tegretol extended release 200 mg twice a day, Continue Zoloft at present dose.  Patient's ultrasound results are pending.  We will followup with internal medicine consult.  Continue to encourage to participate in group milieu therapy.  Patient does require primary care physician /gastroenterologist for high liver enzymes upon discharge.  Discharge planning and progress.  Continue Ativan as needed.    Medical Decision Making Problem Points:  Established problem, stable/improving (1), New problem, with additional work-up planned (4), Review of last therapy session (1) and Review of psycho-social stressors (1) Data Points:  Review or order clinical lab tests (1) Review of medication regiment & side effects (2) Review of new medications or change in dosage (2)  I certify that inpatient services furnished can reasonably be expected to improve the patient's condition.   ARFEEN,SYED T. 02/15/2014, 2:15 PM

## 2014-02-16 DIAGNOSIS — F332 Major depressive disorder, recurrent severe without psychotic features: Secondary | ICD-10-CM

## 2014-02-16 DIAGNOSIS — F1910 Other psychoactive substance abuse, uncomplicated: Secondary | ICD-10-CM

## 2014-02-16 MED ORDER — QUETIAPINE FUMARATE 200 MG PO TABS: 200.0000 mg | ORAL_TABLET | Freq: Every morning | ORAL | Status: DC

## 2014-02-16 MED ORDER — SERTRALINE HCL 50 MG PO TABS: 50.0000 mg | ORAL_TABLET | Freq: Every day | ORAL | Status: DC

## 2014-02-16 MED ORDER — QUETIAPINE FUMARATE 400 MG PO TABS: 400.0000 mg | ORAL_TABLET | Freq: Every day | ORAL | Status: DC

## 2014-02-16 MED ORDER — CARBAMAZEPINE ER 200 MG PO TB12: 200.0000 mg | ORAL_TABLET | Freq: Two times a day (BID) | ORAL | Status: DC

## 2014-02-16 MED ORDER — ATENOLOL 50 MG PO TABS: 50.0000 mg | ORAL_TABLET | Freq: Every day | ORAL | Status: DC

## 2014-02-16 NOTE — Progress Notes (Signed)
[facility] Adult Case Management Discharge Plan :  Will you be returning to the same living situation after discharge: Yes, patient will return to his home.. At discharge, do you have transportation home?:  Yes, patient to arrange transportation home. Do you have the ability to pay for your medications:Yes,  Patient has Medicaid.  Release of information consent forms completed and in the chart;  Patient's signature needed at discharge.  Patient to Follow up at: Follow-up Information   Follow up with [facility] On 02/20/2014. (Please to to [facility]'s walk in clinic on Monday, February 20, 2014 or any weekday between [place] for medication managment/counseling.)    Contact information:   [address] [place], [place]   [address]  [phone]      Follow up with [facility]. (Please call [phone] to schedule with a primary care provider)    Contact information:   [place], [place]  [address]  [phone]      Patient denies SI/HI: Patient no longer endorsing SI/HI or other thoughts of self harm.   Safety Planning and Suicide Prevention discussed: .Reviewed with all patients during discharge planning group  Lirio Bach, [place] Post 02/16/2014, 10:41 AM

## 2014-02-16 NOTE — [facility] Suicide Risk Assessment (Signed)
   Demographic Factors:  Male, Caucasian and Living alone  Total Time spent with patient: 30 minutes  Psychiatric Specialty Exam: Physical Exam  ROS  Blood pressure 136/94, pulse 66, temperature 97.8 F (36.6 C), temperature source Oral, resp. rate 18, height 5\' 9"  (1.753 m), weight 82.555 kg (182 lb), SpO2 92.00%.Body mass index is 26.86 kg/(m^2).  General Appearance: Fairly Groomed  Engineer, water::  Good  Speech:  Normal Rate  Volume:  Normal  Mood:  describes it as " good", denies depression  Affect:  Appropriate  Thought Process:  Linear  Orientation:  NA  Thought Content:  currently denies any hallucinations, and does not appear internally precoupied, he is not  exhibiting any delusions at this time  Suicidal Thoughts:  No  Homicidal Thoughts:  No  Memory:  NA  Judgement:  Fair  Insight:  Fair  Psychomotor Activity:  Normal  Concentration:  Good  Recall:  Good  Fund of Knowledge:Good  Language: Good  Akathisia:  NA  Handed:  Right  AIMS (if indicated):     Assets:  Communication Skills Desire for Improvement Housing Resilience  Sleep:  Number of Hours: 5.5    Musculoskeletal: Strength & Muscle Tone: within normal limits Gait & Station: normal Patient leans: N/A   Mental Status Per Nursing Assessment::   On Admission:  NA  Current Mental Status by Physician: Upon discharge patient calm, cooperative, describing improved mood, affect appropriate, no SI , no HI, denying hallucinations, and no delusions expressed. Future oriented.   Loss Factors: chronic mental illness   Historical Factors: chronic mental illness, history of alcohol abuse   Risk Reduction Factors:   Positive social support, Positive therapeutic relationship and Positive coping skills or problem solving skills  Continued Clinical Symptoms:  Alcohol/Substance Abuse/Dependencies - states he plans to maintain sobriety/abstinence. Describes improved mood and currently denies psychotic  symptoms Cognitive Features That Contribute To Risk:  No gross cognitive deficits noted upon discharge   Suicide Risk:  Mild:  Suicidal ideation of limited frequency, intensity, duration, and specificity.  There are no identifiable plans, no associated intent, mild dysphoria and related symptoms, good self-control (both objective and subjective assessment), few other risk factors, and identifiable protective factors, including available and accessible social support.  Discharge Diagnoses:   AXIS I:  Alcohol Abuse and Schizoaffective Disorder AXIS II:  Deferred AXIS III:   Past Medical History  Diagnosis Date  . Depression   . Psychosis   . PTSD (post-traumatic stress disorder)   . Seizure disorder     related to etoh seizure  . Alcohol abuse   . Schizoaffective disorder   . Anxiety    AXIS IV:  economic problems and occupational problems AXIS V:  51-60 moderate symptoms  Plan Of Care/Follow-up recommendations:  Activity:  As tolerated  Diet:  REgular Tests:  NA Other:  See below  Is patient on multiple antipsychotic therapies at discharge:  No   Has Patient had three or more failed trials of antipsychotic monotherapy by history:  No  Recommended Plan for Multiple Antipsychotic Therapies: NA  Patient is being discharged in good spirits Plans to return home and to follow up at [facility] for ongoing outpatient psychiatric care. Does not currently have an established PCP but plans to get one " soon after I leave". ( [place])  Encouraged to attend AA , use sponsor, and avoid people , places and things/ situations he associates with alcohol consumption.   COBOS, FERNANDO 02/16/2014, 9:45 AM

## 2014-02-16 NOTE — Progress Notes (Signed)
Patient ID: [name], male   DOB: 01-12-1971, 43 y.o.   MRN: [id number] Discharge Note-Reviewed with him his discharge follow up appts and medications Given samples for several of his Rx's and prescriptions of the rest, see AVS. He states he feels well, and denies any thoughts to hurt self or others. He plans to stay sober through his outpatient provider, Beverly Sessions, and getting involved with [facility]. His sister is picking him up today. He verbalizes his understanding of his medications and his follow up. All of his property was returned to him.He voices no complaints.

## 2014-02-16 NOTE — Discharge Summary (Signed)
Physician Discharge Summary Note  Patient:  [name] is an 43 y.o., male MRN:  [id number] DOB:  1970/12/07 Patient phone:  [phone] (home)  Patient address:   [address] [place] [address],  Total Time spent with patient: 30 minutes  Date of Admission:  02/09/2014 Date of Discharge: 02/16/2014  Reason for Admission:  43 year old male with schizoaffective disorder presented to [facility] for ETOH intoxication and auditory hallucinations. He drinks at least 4 large beers daily which according to patient each large beer is the equivalent to a small can of beer. He reports that he also experiences visual hallucinations as he sees shadows. He has PTSD as a result of seeing his fiance die in front of him because she overdosed on medication. He reports a history of seizures approximately 3 years ago, resulting from alcohol withdrawals. He has passive SI without a plan, states "I don't want to die".    Discharge Diagnoses: Active Problems:   Alcohol dependence   Schizoaffective disorder   Abnormal liver enzymes   Psychiatric Specialty Exam: Physical Exam  Constitutional: He is oriented to person, place, and time. He appears well-developed.  HENT:  Head: Normocephalic.  Eyes: Pupils are equal, round, and reactive to light.  Neck: Normal range of motion.  Cardiovascular: Normal rate.   Respiratory: Effort normal.  GI: Soft.  Musculoskeletal: Normal range of motion.  Neurological: He is alert and oriented to person, place, and time.  Skin: Skin is warm and dry.    Review of Systems  Psychiatric/Behavioral: Positive for depression (condition is stable on medication), suicidal ideas (conditon is stable on medication), hallucinations (condition is controlled) and substance abuse (condition is stable).  All other systems reviewed and are negative.   Blood pressure 136/94, pulse 66, temperature 97.8 F (36.6 C), temperature source Oral, resp. rate 18, height _0  (1.753 m),  weight 82.555 kg (182 lb), SpO2 92.00%.Body mass index is 26.86 kg/(m^2).  General Appearance: Casual and Fairly Groomed  Engineer, water::  Good  Speech:  Clear and Coherent and Normal Rate  Volume:  Normal  Mood:  Euthymic  Affect:  Appropriate and Congruent  Thought Process:  Coherent, Goal Directed and Intact  Orientation:  Full (Time, Place, and Person)  Thought Content:  WDL  Suicidal Thoughts:  No  Homicidal Thoughts:  No  Memory:  Immediate;   Good Recent;   Good Remote;   Good  Judgement:  Good  Insight:  Good  Psychomotor Activity:  Normal  Concentration:  Good  Recall:  Good  Fund of Knowledge:Good  Language: Good  Akathisia:  No  Handed:  Right  AIMS (if indicated):     Assets:  Communication Skills Desire for Improvement Financial Resources/Insurance Housing Physical Health  Sleep:  Number of Hours: 5.5    Past Psychiatric History:  Diagnosis:Alcohol abuse, MDD, Schizoaffective disorder   Hospitalizations:multiple   Outpatient Care: [facility]   Substance Abuse Care: history   Suicidal Attempts: None         Musculoskeletal: Strength & Muscle Tone: within normal limits Gait & Station: normal Patient leans: Right  DSM5:  Schizophrenia Disorders:  Schizoaffective Disorder Obsessive-Compulsive Disorders:   Trauma-Stressor Disorders:  Posttraumatic Stress Disorder (309.81) Substance/Addictive Disorders:  Alcohol Related Disorder - Moderate (303.90) Depressive Disorders:  Major Depressive Disorder - Moderate (296.22)  Axis Diagnosis:   AXIS I:  Alcohol Abuse, Major Depression, Recurrent severe, See current hospital problem list and Substance Abuse AXIS II:  Schizoaffective AXIS III:   Past  Medical History  Diagnosis Date  . Depression   . Psychosis   . PTSD (post-traumatic stress disorder)   . Seizure disorder     related to etoh seizure  . Alcohol abuse   . Schizoaffective disorder   . Anxiety    AXIS IV:  economic problems, educational  problems, housing problems, occupational problems, problems related to social environment, problems with access to health care services and problems with primary support group AXIS V:  31-40 impairment in reality testing  Level of Care:  OP  Hospital Course:  Medication management and therapeutic groups.   Consults:  psychiatry  Significant Diagnostic Studies:  None  Discharge Vitals:   Blood pressure 136/94, pulse 66, temperature 97.8 F (36.6 C), temperature source Oral, resp. rate 18, height _0  (1.753 m), weight 82.555 kg (182 lb), SpO2 92.00%. Body mass index is 26.86 kg/(m^2). Lab Results:   Results for orders placed during the hospital encounter of 02/09/14 (from the past 72 hour(s))  COMPREHENSIVE METABOLIC PANEL     Status: Abnormal   Collection Time    02/13/14  8:04 PM      Result Value Ref Range   Sodium 141  137 - 147 mEq/L   Potassium 4.7  3.7 - 5.3 mEq/L   Comment: DELTA CHECK NOTED     NO VISIBLE HEMOLYSIS     REPEATED TO VERIFY   Chloride 96  96 - 112 mEq/L   Comment: DELTA CHECK NOTED     REPEATED TO VERIFY   CO2 30  19 - 32 mEq/L   Glucose, Bld 85  70 - 99 mg/dL   BUN 20  6 - 23 mg/dL   Creatinine, Ser 0.92  0.50 - 1.35 mg/dL   Calcium 9.7  8.4 - 10.5 mg/dL   Total Protein 6.7  6.0 - 8.3 g/dL   Albumin 3.9  3.5 - 5.2 g/dL   AST 192 (*) 0 - 37 U/L   ALT 257 (*) 0 - 53 U/L   Alkaline Phosphatase 87  39 - 117 U/L   Total Bilirubin 0.5  0.3 - 1.2 mg/dL   GFR calc non Af Amer >90  >90 mL/min   GFR calc Af Amer >90  >90 mL/min   Comment: (NOTE)     The eGFR has been calculated using the CKD EPI equation.     This calculation has not been validated in all clinical situations.     eGFR's persistently <90 mL/min signify possible Chronic Kidney     Disease.     Performed at [facility], ACUTE     Status: None   Collection Time    02/14/14  8:06 PM      Result Value Ref Range   Hepatitis B Surface Ag NEGATIVE  NEGATIVE    HCV Ab NEGATIVE  NEGATIVE   Hep A IgM NON REACTIVE  NON REACTIVE   Hep B C IgM NON REACTIVE  NON REACTIVE   Comment: (NOTE)     High levels of Hepatitis B Core IgM antibody are detectable     during the acute stage of Hepatitis B. This antibody is used     to differentiate current from past HBV infection.     Performed at [facility]     Status: Abnormal   Collection Time    02/15/14  6:18 AM      Result Value Ref Range   Sodium 141  137 - 147 mEq/L   Potassium 5.4 (*) 3.7 - 5.3 mEq/L   Chloride 103  96 - 112 mEq/L   CO2 27  19 - 32 mEq/L   Glucose, Bld 80  70 - 99 mg/dL   BUN 15  6 - 23 mg/dL   Creatinine, Ser 0.78  0.50 - 1.35 mg/dL   Calcium 9.7  8.4 - 10.5 mg/dL   Total Protein 6.7  6.0 - 8.3 g/dL   Albumin 3.8  3.5 - 5.2 g/dL   AST 129 (*) 0 - 37 U/L   ALT 249 (*) 0 - 53 U/L   Alkaline Phosphatase 84  39 - 117 U/L   Total Bilirubin 0.5  0.3 - 1.2 mg/dL   GFR calc non Af Amer >90  >90 mL/min   GFR calc Af Amer >90  >90 mL/min   Comment: (NOTE)     The eGFR has been calculated using the CKD EPI equation.     This calculation has not been validated in all clinical situations.     eGFR's persistently <90 mL/min signify possible Chronic Kidney     Disease.     Performed at [facility]    Physical Findings: AIMS: Facial and Oral Movements Muscles of Facial Expression: None, normal Lips and Perioral Area: None, normal Jaw: None, normal Tongue: None, normal,Extremity Movements Upper (arms, wrists, hands, fingers): None, normal Lower (legs, knees, ankles, toes): None, normal, Trunk Movements Neck, shoulders, hips: None, normal, Overall Severity Severity of abnormal movements (highest score from questions above): None, normal Incapacitation due to abnormal movements: None, normal Patient's awareness of abnormal movements (rate only patient's report): No Awareness, Dental Status Current problems with teeth and/or  dentures?: No Does patient usually wear dentures?: No  CIWA:  CIWA-Ar Total: 0 COWS:  COWS Total Score: 2  Psychiatric Specialty Exam: See Psychiatric Specialty Exam and Suicide Risk Assessment completed by Attending Physician prior to discharge.  Discharge destination:  Home  Is patient on multiple antipsychotic therapies at discharge:  No   Has Patient had three or more failed trials of antipsychotic monotherapy by history:  No  Recommended Plan for Multiple Antipsychotic Therapies: NA     Medication List       Indication   atenolol 50 MG tablet  Commonly known as:  TENORMIN  Take 1 tablet (50 mg total) by mouth daily.   Indication:  Alcohol Withdrawal Syndrome, High Blood Pressure     carbamazepine 200 MG 12 hr tablet  Commonly known as:  TEGRETOL XR  Take 1 tablet (200 mg total) by mouth 2 (two) times daily.   Indication:  Alcohol Withdrawal Syndrome, Mood stabilizer     carbamazepine 200 MG 12 hr tablet  Commonly known as:  TEGRETOL XR  Take 1 tablet (200 mg total) by mouth 2 (two) times daily.   Indication:  Alcohol Withdrawal Syndrome, Schizoaffective disorder     doxepin 50 MG capsule  Commonly known as:  SINEQUAN  Take 1 capsule (50 mg total) by mouth at bedtime.   Indication:  Depression, Insomnia     prazosin 5 MG capsule  Commonly known as:  MINIPRESS  Take 1 capsule (5 mg total) by mouth at bedtime.   Indication:  Insomnia/PTSD     QUEtiapine 200 MG tablet  Commonly known as:  SEROQUEL  Take 1 tablet (200 mg total) by mouth every morning.   Indication:  Schizophrenia     QUEtiapine 400 MG tablet  Commonly known  as:  SEROQUEL  Take 1 tablet (400 mg total) by mouth at bedtime.   Indication:  Schizophrenia     ranitidine 75 MG tablet  Commonly known as:  ZANTAC  Take 75 mg by mouth daily as needed for heartburn.      sertraline 100 MG tablet  Commonly known as:  ZOLOFT  Take 2 tablets (200 mg total) by mouth daily.   Indication:  Anxiety  Disorder, Major Depressive Disorder, Posttraumatic Stress Disorder     sertraline 50 MG tablet  Commonly known as:  ZOLOFT  Take 1 tablet (50 mg total) by mouth daily.   Indication:  Major Depressive Disorder, Posttraumatic Stress Disorder           Follow-up Information   Follow up with [facility] On 02/20/2014. (Please to to [facility]'s walk in clinic on Monday, February 20, 2014 or any weekday between [place] for medication managment/counseling.)    Contact information:   [address] [place], [place]   [address]  [phone]      Follow up with [facility]. (Please call [phone] to schedule with a primary care provider)    Contact information:   [place],   [address]  [phone]      Follow-up recommendations:  Activity:  Increase activity as tolerated Diet:  Low sodium diet  Comments:  Please continue to take medications as directed. If your symptoms return, worsen, or persist please call your 911, report to local ER, or contact crisis hotline. Please do not drink alcohol or use any illegal substances while taking prescription medications.   Total Discharge Time:  Less than 30 minutes.  Signed: Nanci Pina FNP 02/16/2014, 1:55 PM

## 2014-02-21 NOTE — Progress Notes (Signed)
Patient Discharge Instructions:  After Visit Summary (AVS):   Faxed to:  02/21/14 Discharge Summary Note:   Faxed to:  02/21/14 Psychiatric Admission Assessment Note:   Faxed to:  02/21/14 Suicide Risk Assessment - Discharge Assessment:   Faxed to:  02/21/14 Faxed/Sent to the Next Level Care provider:  02/21/14 Next Level Care Provider Has Access to the EMR, 02/21/14  Faxed to [place] @ [phone] Records provided to [facility] via [facility]/Epic access.  Patsey Berthold, 02/21/2014, 3:23 PM

## 2014-05-03 ENCOUNTER — Emergency Department (HOSPITAL_COMMUNITY)
Admission: EM | Admit: 2014-05-03 | Discharge: 2014-05-04 | Disposition: A | Discharge status: Other Acute Inpatient Hospital | Payer: Medicare Other | Source: Home / Self Care | Attending: Emergency Medicine | Admitting: Emergency Medicine

## 2014-05-03 ENCOUNTER — Encounter (HOSPITAL_COMMUNITY): Payer: Self-pay | Admitting: Emergency Medicine

## 2014-05-03 DIAGNOSIS — F411 Generalized anxiety disorder: Secondary | ICD-10-CM | POA: Diagnosis not present

## 2014-05-03 DIAGNOSIS — G40909 Epilepsy, unspecified, not intractable, without status epilepticus: Secondary | ICD-10-CM | POA: Insufficient documentation

## 2014-05-03 DIAGNOSIS — Z8659 Personal history of other mental and behavioral disorders: Secondary | ICD-10-CM

## 2014-05-03 DIAGNOSIS — R45851 Suicidal ideations: Secondary | ICD-10-CM

## 2014-05-03 DIAGNOSIS — F1210 Cannabis abuse, uncomplicated: Secondary | ICD-10-CM | POA: Insufficient documentation

## 2014-05-03 DIAGNOSIS — F1020 Alcohol dependence, uncomplicated: Secondary | ICD-10-CM | POA: Diagnosis present

## 2014-05-03 DIAGNOSIS — H5316 Psychophysical visual disturbances: Secondary | ICD-10-CM | POA: Insufficient documentation

## 2014-05-03 DIAGNOSIS — F4310 Post-traumatic stress disorder, unspecified: Secondary | ICD-10-CM | POA: Diagnosis not present

## 2014-05-03 DIAGNOSIS — F3289 Other specified depressive episodes: Secondary | ICD-10-CM | POA: Insufficient documentation

## 2014-05-03 DIAGNOSIS — F1010 Alcohol abuse, uncomplicated: Secondary | ICD-10-CM

## 2014-05-03 DIAGNOSIS — F329 Major depressive disorder, single episode, unspecified: Secondary | ICD-10-CM | POA: Insufficient documentation

## 2014-05-03 DIAGNOSIS — F17200 Nicotine dependence, unspecified, uncomplicated: Secondary | ICD-10-CM | POA: Diagnosis not present

## 2014-05-03 DIAGNOSIS — Z791 Long term (current) use of non-steroidal anti-inflammatories (NSAID): Secondary | ICD-10-CM | POA: Insufficient documentation

## 2014-05-03 DIAGNOSIS — R10811 Right upper quadrant abdominal tenderness: Secondary | ICD-10-CM | POA: Insufficient documentation

## 2014-05-03 DIAGNOSIS — Z8669 Personal history of other diseases of the nervous system and sense organs: Secondary | ICD-10-CM

## 2014-05-03 DIAGNOSIS — N390 Urinary tract infection, site not specified: Secondary | ICD-10-CM | POA: Insufficient documentation

## 2014-05-03 DIAGNOSIS — F259 Schizoaffective disorder, unspecified: Secondary | ICD-10-CM | POA: Insufficient documentation

## 2014-05-03 LAB — COMPREHENSIVE METABOLIC PANEL
ALT: 280 U/L — ABNORMAL HIGH (ref 0–53)
AST: 375 U/L — ABNORMAL HIGH (ref 0–37)
Albumin: 5.1 g/dL (ref 3.5–5.2)
Alkaline Phosphatase: 106 U/L (ref 39–117)
Anion gap: 28 — ABNORMAL HIGH (ref 5–15)
BUN: 8 mg/dL (ref 6–23)
CO2: 24 mEq/L (ref 19–32)
Calcium: 9.7 mg/dL (ref 8.4–10.5)
Chloride: 82 mEq/L — ABNORMAL LOW (ref 96–112)
Creatinine, Ser: 0.78 mg/dL (ref 0.50–1.35)
GFR calc Af Amer: >90 mL/min (ref >90)
GFR calc non Af Amer: >90 mL/min (ref >90)
Glucose, Bld: 99 mg/dL (ref 70–99)
Potassium: 3.5 mEq/L — ABNORMAL LOW (ref 3.7–5.3)
Sodium: 134 mEq/L — ABNORMAL LOW (ref 137–147)
Total Bilirubin: 0.9 mg/dL (ref 0.3–1.2)
Total Protein: 8.6 g/dL — ABNORMAL HIGH (ref 6.0–8.3)

## 2014-05-03 LAB — SALICYLATE LEVEL: Salicylate Lvl: <2 mg/dL — ABNORMAL LOW (ref 2.8–20.0)

## 2014-05-03 LAB — CBC
HCT: 50.3 % (ref 39.0–52.0)
Hemoglobin: 18.5 g/dL — ABNORMAL HIGH (ref 13.0–17.0)
MCH: 34.8 pg — ABNORMAL HIGH (ref 26.0–34.0)
MCHC: 36.8 g/dL — ABNORMAL HIGH (ref 30.0–36.0)
MCV: 94.5 fL (ref 78.0–100.0)
Platelets: 155 10*3/uL (ref 150–400)
RBC: 5.32 MIL/uL (ref 4.22–5.81)
RDW: 13.1 % (ref 11.5–15.5)
WBC: 7.3 10*3/uL (ref 4.0–10.5)

## 2014-05-03 LAB — URINE MICROSCOPIC-ADD ON

## 2014-05-03 LAB — URINALYSIS, ROUTINE W REFLEX MICROSCOPIC
Bilirubin Urine: NEGATIVE
Glucose, UA: NEGATIVE mg/dL
Ketones, ur: 15 mg/dL — AB
Nitrite: NEGATIVE
Protein, ur: 100 mg/dL — AB
Specific Gravity, Urine: 1.015 (ref 1.005–1.030)
Urobilinogen, UA: 1 mg/dL (ref 0.0–1.0)
pH: 5.5 (ref 5.0–8.0)

## 2014-05-03 LAB — RAPID URINE DRUG SCREEN, HOSP PERFORMED
Amphetamines: NOT DETECTED
Barbiturates: NOT DETECTED
Benzodiazepines: NOT DETECTED
Cocaine: NOT DETECTED
Opiates: NOT DETECTED
Tetrahydrocannabinol: POSITIVE — AB

## 2014-05-03 LAB — ACETAMINOPHEN LEVEL: Acetaminophen (Tylenol), Serum: <15 ug/mL (ref 10–30)

## 2014-05-03 LAB — ETHANOL: Alcohol, Ethyl (B): 331 mg/dL — ABNORMAL HIGH (ref 0–11)

## 2014-05-03 LAB — LIPASE, BLOOD: Lipase: 52 U/L (ref 11–59)

## 2014-05-03 MED ORDER — ADULT MULTIVITAMIN W/MINERALS CH
1.0000 | ORAL_TABLET | Freq: Every day | ORAL | Status: DC
  Administered 2014-05-03 – 2014-05-04 (×2): 1 via ORAL
  Filled 2014-05-03 (×2): qty 1

## 2014-05-03 MED ORDER — QUETIAPINE FUMARATE 300 MG PO TABS
400.0000 mg | ORAL_TABLET | Freq: Every day | ORAL | Status: DC
  Administered 2014-05-03: 400 mg via ORAL
  Filled 2014-05-03 (×2): qty 1

## 2014-05-03 MED ORDER — ONDANSETRON HCL 4 MG/2ML IJ SOLN
4.0000 mg | Freq: Once | INTRAMUSCULAR | Status: AC
  Administered 2014-05-03: 4 mg via INTRAVENOUS
  Filled 2014-05-03: qty 2

## 2014-05-03 MED ORDER — NICOTINE 21 MG/24HR TD PT24
21.0000 mg | MEDICATED_PATCH | Freq: Once | TRANSDERMAL | Status: DC
  Administered 2014-05-03: 21 mg via TRANSDERMAL
  Filled 2014-05-03: qty 1

## 2014-05-03 MED ORDER — LORAZEPAM 1 MG PO TABS
0.0000 mg | ORAL_TABLET | Freq: Four times a day (QID) | ORAL | Status: DC
  Administered 2014-05-03: 2 mg via ORAL
  Administered 2014-05-04: 1 mg via ORAL
  Filled 2014-05-03: qty 1
  Filled 2014-05-03: qty 2

## 2014-05-03 MED ORDER — SODIUM CHLORIDE 0.9 % IV SOLN
1000.0000 mL | INTRAVENOUS | Status: DC
  Administered 2014-05-03: 1000 mL via INTRAVENOUS

## 2014-05-03 MED ORDER — LORAZEPAM 2 MG/ML IJ SOLN: 1.0000 mg | Freq: Four times a day (QID) | INTRAMUSCULAR | Status: DC | PRN

## 2014-05-03 MED ORDER — SODIUM CHLORIDE 0.9 % IV SOLN
1000.0000 mL | Freq: Once | INTRAVENOUS | Status: AC
  Administered 2014-05-03: 1000 mL via INTRAVENOUS

## 2014-05-03 MED ORDER — CEPHALEXIN 500 MG PO CAPS
500.0000 mg | ORAL_CAPSULE | Freq: Three times a day (TID) | ORAL | Status: DC
  Administered 2014-05-03 – 2014-05-04 (×2): 500 mg via ORAL
  Filled 2014-05-03 (×2): qty 1

## 2014-05-03 MED ORDER — LORAZEPAM 1 MG PO TABS: 0.0000 mg | ORAL_TABLET | Freq: Two times a day (BID) | ORAL | Status: DC

## 2014-05-03 MED ORDER — THIAMINE HCL 100 MG/ML IJ SOLN
100.0000 mg | Freq: Every day | INTRAMUSCULAR | Status: DC
  Administered 2014-05-03: 100 mg via INTRAVENOUS
  Filled 2014-05-03: qty 2

## 2014-05-03 MED ORDER — LORAZEPAM 1 MG PO TABS
1.0000 mg | ORAL_TABLET | Freq: Four times a day (QID) | ORAL | Status: DC | PRN
  Administered 2014-05-04: 1 mg via ORAL
  Filled 2014-05-03: qty 1

## 2014-05-03 MED ORDER — VITAMIN B-1 100 MG PO TABS
100.0000 mg | ORAL_TABLET | Freq: Every day | ORAL | Status: DC
  Administered 2014-05-04: 100 mg via ORAL
  Filled 2014-05-03: qty 1

## 2014-05-03 MED ORDER — FOLIC ACID 1 MG PO TABS
1.0000 mg | ORAL_TABLET | Freq: Every day | ORAL | Status: DC
  Administered 2014-05-03 – 2014-05-04 (×2): 1 mg via ORAL
  Filled 2014-05-03 (×2): qty 1

## 2014-05-03 NOTE — ED Notes (Signed)
Pt states that he drinks 5 tall boys per day.  Last drink was at 1 pm.  Pt shaking in triage.  Pt having AV hallucinations (shadow people laughing at him).  Pt SI w/o plan.  No HI.

## 2014-05-03 NOTE — ED Provider Notes (Signed)
CSN: [id number]     Arrival date & time 05/03/14  1654 History   First MD Initiated Contact with Patient 05/03/14 1719     Chief Complaint  Patient presents with  . Suicidal   . ETOH detox     HPI Patient presents to the emergency room with complaints of depression and suicidal ideation. The patient is an alcoholic and has been drinking very heavily. He last drank at about 1 PM. He has been drinking 5 tall boys of beer daily. Patient has history of schizo affective disorder. He stopped taking his medications. Patient is having some hallucinations as well where he sees people laughing at him. The patient is depressed and hopeless. He has had thoughts of suicide but no specific plan.  Patient also has been having trouble with abdominal pain nausea and vomiting. This has been occurring with his alcohol use. He denies any fevers. He denies any bloating. He denies any other drug use. Past Medical History  Diagnosis Date  . Depression   . Psychosis   . PTSD (post-traumatic stress disorder)   . Seizure disorder     related to etoh seizure  . Alcohol abuse   . Schizoaffective disorder   . Anxiety    Past Surgical History  Procedure Laterality Date  . Foot surgery      right  . Hand surgery     No family history on file. History  Substance Use Topics  . Smoking status: Current Every Day Smoker -- 1.50 packs/day for 24 years    Types: Cigarettes  . Smokeless tobacco: Never Used  . Alcohol Use: 50.4 oz/week    84 Cans of beer per week     Comment: 6-8 cans beer per day, 24 ounce cans    Review of Systems  All other systems reviewed and are negative.     Allergies  Review of patient's allergies indicates no known allergies.  Home Medications   Prior to Admission medications   Medication Sig Start Date End Date Taking? Authorizing Provider  naproxen sodium (ANAPROX) 220 MG tablet Take 220 mg by mouth 2 (two) times daily with a meal.   Yes Historical Provider, MD   BP 137/81   Pulse 113  Temp(Src) 98.4 F (36.9 C) (Oral)  Resp 9  SpO2 97% Physical Exam  Nursing note and vitals reviewed. Constitutional: He appears well-developed and well-nourished. No distress.  HENT:  Head: Normocephalic and atraumatic.  Right Ear: External ear normal.  Left Ear: External ear normal.  Breath smells of alcohol  Eyes: Conjunctivae are normal. Right eye exhibits no discharge. Left eye exhibits no discharge. No scleral icterus.  Neck: Neck supple. No tracheal deviation present.  Cardiovascular: Normal rate, regular rhythm and intact distal pulses.   Pulmonary/Chest: Effort normal and breath sounds normal. No stridor. No respiratory distress. He has no wheezes. He has no rales.  Abdominal: Soft. Bowel sounds are normal. He exhibits no distension. There is tenderness in the right upper quadrant and epigastric area. There is no rebound and no guarding. No hernia.  Musculoskeletal: He exhibits no edema and no tenderness.  Neurological: He is alert. He has normal strength. No cranial nerve deficit (no facial droop, extraocular movements intact, no slurred speech) or sensory deficit. He exhibits normal muscle tone. He displays no seizure activity. Coordination normal.  Skin: Skin is warm and dry. No rash noted.  Psychiatric: His affect is not angry, not labile and not inappropriate. His speech is not tangential. He is  slowed. Thought content is not paranoid. He exhibits a depressed mood. He expresses suicidal ideation. He is communicative.    ED Course  Procedures (including critical care time) Labs Review Labs Reviewed  CBC - Abnormal; Notable for the following:    Hemoglobin 18.5 (*)    MCH 34.8 (*)    MCHC 36.8 (*)    All other components within normal limits  COMPREHENSIVE METABOLIC PANEL - Abnormal; Notable for the following:    Sodium 134 (*)    Potassium 3.5 (*)    Chloride 82 (*)    Total Protein 8.6 (*)    AST 375 (*)    ALT 280 (*)    Anion gap 28 (*)    All  other components within normal limits  ETHANOL - Abnormal; Notable for the following:    Alcohol, Ethyl (B) 331 (*)    All other components within normal limits  SALICYLATE LEVEL - Abnormal; Notable for the following:    Salicylate Lvl <3.2 (*)    All other components within normal limits  URINE RAPID DRUG SCREEN (HOSP PERFORMED) - Abnormal; Notable for the following:    Tetrahydrocannabinol POSITIVE (*)    All other components within normal limits  URINALYSIS, ROUTINE W REFLEX MICROSCOPIC - Abnormal; Notable for the following:    APPearance CLOUDY (*)    Hgb urine dipstick SMALL (*)    Ketones, ur 15 (*)    Protein, ur 100 (*)    Leukocytes, UA TRACE (*)    All other components within normal limits  URINE MICROSCOPIC-ADD ON - Abnormal; Notable for the following:    Squamous Epithelial / LPF FEW (*)    Bacteria, UA FEW (*)    Casts HYALINE CASTS (*)    All other components within normal limits  URINE CULTURE  GC/CHLAMYDIA PROBE AMP  ACETAMINOPHEN LEVEL  LIPASE, BLOOD  RPR  HIV ANTIBODY (ROUTINE TESTING)     MDM   Final diagnoses:  Alcohol abuse  UTI (lower urinary tract infection)  Suicidal ideation    Pt intoxicated but otherwise stable.  Labs similar to previous labs.  Stable for psych assessment    Dorie Rank, MD 05/04/14 [phone]

## 2014-05-03 NOTE — ED Notes (Signed)
PT eating.

## 2014-05-03 NOTE — ED Notes (Signed)
Pt requesting some medication to calm him he states " I'm kind of freaking out".

## 2014-05-03 NOTE — ED Notes (Signed)
Pt reports that he is seeing shadows that tell him he "is a piece of shit". Pt is shaking. He is calm and cooperative at this time. He reports his last drink was at 1300 today. He also reports that he has been off his schizoaffective meds for a month .

## 2014-05-03 NOTE — ED Notes (Signed)
Pt c/o centralized abdominal pain.

## 2014-05-03 NOTE — ED Notes (Signed)
Belongings placed in locker number 29 . One belonging bag.

## 2014-05-03 NOTE — ED Notes (Signed)
MD Knapp at bedside 

## 2014-05-03 NOTE — Progress Notes (Signed)
  CARE MANAGEMENT ED NOTE 05/03/2014  Patient:  DHIREN, AZIMI   Account Number:  [id number]  Date Initiated:  05/03/2014  Documentation initiated by:  Livia Snellen  Subjective/Objective Assessment:   Patient presents to the Ed w ith visual/audible hallucinations, SI, depression     Subjective/Objective Assessment Detail:   Patient with pmhx of depression, PTSD, seizure disorder, anxiety, schizoaffective disorder     Action/Plan:   TTS consult   Action/Plan Detail:   Anticipated DC Date:       Status Recommendation to Physician:   Result of Recommendation:    Other ED [place]  Other  PCP issues    Choice offered to / List presented to:            Status of service:  Completed, signed off  ED Comments:   ED Comments Detail:  EDCM spoke to patient at bedside.  Patient confirms he has Medicare insurance without a pcp.  Patient reports he does have a psychiatrist at [facility] but he has not seen her "in awhile."  Patient unsure of her name, Pauline Good? EDCM provided patient with a list of pcps who accept Medicare insurance within a [place] zip code.Patient thankful for resources.  No further EDCM needs at this time.

## 2014-05-04 ENCOUNTER — Encounter (HOSPITAL_COMMUNITY): Payer: Self-pay | Admitting: *Deleted

## 2014-05-04 ENCOUNTER — Observation Stay (HOSPITAL_COMMUNITY)
Admission: EM | Admit: 2014-05-04 | Discharge: 2014-05-05 | Disposition: A | Discharge status: Home / Self Care | Payer: Medicare Other | Source: Intra-hospital | Attending: Psychiatry | Admitting: Psychiatry

## 2014-05-04 DIAGNOSIS — F1020 Alcohol dependence, uncomplicated: Secondary | ICD-10-CM | POA: Diagnosis present

## 2014-05-04 DIAGNOSIS — F250 Schizoaffective disorder, bipolar type: Secondary | ICD-10-CM

## 2014-05-04 DIAGNOSIS — F10230 Alcohol dependence with withdrawal, uncomplicated: Secondary | ICD-10-CM

## 2014-05-04 LAB — URINE CULTURE
Colony Count: NO GROWTH
Culture: NO GROWTH
Special Requests: NORMAL

## 2014-05-04 LAB — HIV ANTIBODY (ROUTINE TESTING W REFLEX): HIV 1&2 Ab, 4th Generation: NONREACTIVE

## 2014-05-04 LAB — RPR

## 2014-05-04 MED ORDER — ADULT MULTIVITAMIN W/MINERALS CH
1.0000 | ORAL_TABLET | Freq: Every day | ORAL | Status: DC
  Administered 2014-05-05: 1 via ORAL
  Filled 2014-05-04 (×3): qty 1

## 2014-05-04 MED ORDER — FOLIC ACID 1 MG PO TABS
1.0000 mg | ORAL_TABLET | Freq: Every day | ORAL | Status: DC
  Administered 2014-05-05: 1 mg via ORAL
  Filled 2014-05-04 (×3): qty 1

## 2014-05-04 MED ORDER — NICOTINE 21 MG/24HR TD PT24
21.0000 mg | MEDICATED_PATCH | Freq: Every day | TRANSDERMAL | Status: DC
  Administered 2014-05-04 – 2014-05-05 (×2): 21 mg via TRANSDERMAL
  Filled 2014-05-04 (×5): qty 1

## 2014-05-04 MED ORDER — QUETIAPINE FUMARATE 400 MG PO TABS
400.0000 mg | ORAL_TABLET | Freq: Every day | ORAL | Status: DC
  Administered 2014-05-04: 400 mg via ORAL
  Filled 2014-05-04: qty 2
  Filled 2014-05-04 (×2): qty 1

## 2014-05-04 MED ORDER — CEPHALEXIN 500 MG PO CAPS
500.0000 mg | ORAL_CAPSULE | Freq: Three times a day (TID) | ORAL | Status: DC
  Administered 2014-05-04 – 2014-05-05 (×3): 500 mg via ORAL
  Filled 2014-05-04: qty 2
  Filled 2014-05-04 (×4): qty 1
  Filled 2014-05-04 (×2): qty 2
  Filled 2014-05-04 (×2): qty 1

## 2014-05-04 MED ORDER — LORAZEPAM 1 MG PO TABS: 0.0000 mg | ORAL_TABLET | Freq: Two times a day (BID) | ORAL | Status: DC

## 2014-05-04 MED ORDER — LORAZEPAM 1 MG PO TABS: 1.0000 mg | ORAL_TABLET | Freq: Four times a day (QID) | ORAL | Status: DC | PRN

## 2014-05-04 MED ORDER — ACETAMINOPHEN 325 MG PO TABS
650.0000 mg | ORAL_TABLET | Freq: Four times a day (QID) | ORAL | Status: DC | PRN
  Administered 2014-05-05: 650 mg via ORAL
  Filled 2014-05-04: qty 2

## 2014-05-04 MED ORDER — LORAZEPAM 1 MG PO TABS
0.0000 mg | ORAL_TABLET | Freq: Four times a day (QID) | ORAL | Status: DC
  Administered 2014-05-04 – 2014-05-05 (×3): 1 mg via ORAL
  Filled 2014-05-04 (×4): qty 1

## 2014-05-04 MED ORDER — LORAZEPAM 2 MG/ML IJ SOLN: 1.0000 mg | Freq: Four times a day (QID) | INTRAMUSCULAR | Status: DC | PRN

## 2014-05-04 MED ORDER — VITAMIN B-1 100 MG PO TABS
100.0000 mg | ORAL_TABLET | Freq: Every day | ORAL | Status: DC
  Administered 2014-05-05: 100 mg via ORAL
  Filled 2014-05-04 (×3): qty 1

## 2014-05-04 MED ORDER — ALUM & MAG HYDROXIDE-SIMETH 200-200-20 MG/5ML PO SUSP: 30.0000 mL | ORAL | Status: DC | PRN

## 2014-05-04 MED ORDER — MAGNESIUM HYDROXIDE 400 MG/5ML PO SUSP: 30.0000 mL | Freq: Every day | ORAL | Status: DC | PRN

## 2014-05-04 MED ORDER — THIAMINE HCL 100 MG/ML IJ SOLN: 100.0000 mg | Freq: Every day | INTRAMUSCULAR | Status: DC

## 2014-05-04 NOTE — ED Notes (Signed)
Patient complains of increase anxiety. Respirations equal and unlabored, skin warm and dry. No acute distress noted. Will medicate per MAR.

## 2014-05-04 NOTE — Consult Note (Signed)
  [name] remains depressed, still needs detox but is not actively suicidal this morning.  He has been accepted to Observation bed 2 at [facility] with a transfer today for treatment of depression and alcohol withdrawal.

## 2014-05-04 NOTE — ED Notes (Signed)
[facility] contacted for transport.

## 2014-05-04 NOTE — Progress Notes (Signed)
Patient ID: [name], male   DOB: Aug 28, 1970, 43 y.o.   MRN: [id number] Admit note. Pt accepted to OBS unit. Patient reports he has been off his medication for one month and relapsed, drinking 4 to 5 24 oz beers daily. He states '' I was hearing voices and things but not right now. '' I want to get back on my medication, I was taking seroquel, tegretol and zoloft. Pt oriented to unit, LOW fall risk. Pt denies any SI and is able to contract for safety. Skin searched no contraband found. Pt provided meal. No further voiced concerns at this time. Pt is in no acute distress at this time.

## 2014-05-04 NOTE — ED Notes (Signed)
Patient transferred to [facility] Observation unit.  Report called to Ria Comment, [place].  Patient left the unit ambulatory with [employer].  All belongings given to the driver.

## 2014-05-04 NOTE — Progress Notes (Signed)
Pt alert, oriented and cooperative. Denies SI/HI, verbally contracts for safety. -A/[employer]. C/o w/d symptoms. See MAR. Will continue to monitor and evaluate for stabilization.

## 2014-05-04 NOTE — BHH Counselor (Signed)
Pt accepted to OBS bed #2. Support paperwork signed and faxed to [facility]. Originals placed in pt's chart.   Arnold Long, [place] Assessment Counselor

## 2014-05-04 NOTE — BH Assessment (Signed)
Tele Assessment Note   [name] is a 43 y.o. male who voluntarily presents to [facility] with SI /Depression and requests alcohol detox.  Pt is SI w/no specific plan to harm self. He has 3 past SI attempts by overdose which happened approx 6 yrs ago, triggered by his witnessing his fiance committing SI in front of him. Pt states that he's been off his meds x1 because he ran out and didn't refill them, pt.'s mental health provider is [place].  Pt is moderately anxious during interview and has some difficulty concentrating on questions posed to him.  He endorses aud/visual hallucinations, stating that he is seeing shadow people, they are very graphic when they are speaking to him and they have any evil laugh--"you are stupid".    Pt reports that he drinks 4-24oz cans of beer, daily.  His last drink was 05/03/14, he drank 4-24oz beers.  He also uses 3-5 marijuana "joints" on a weekly basis and says his last was 1 week ago.    Axis I: Generalized Anxiety Disorder, Post Traumatic Stress Disorder, Schizoaffective Disorder and Cannabis Use D/O  Axis II: Deferred Axis III:  Past Medical History  Diagnosis Date  . Depression   . Psychosis   . PTSD (post-traumatic stress disorder)   . Seizure disorder     related to etoh seizure  . Alcohol abuse   . Schizoaffective disorder   . Anxiety    Axis IV: other psychosocial or environmental problems, problems related to social environment and problems with primary support group Axis V: 31-40 impairment in reality testing  Past Medical History:  Past Medical History  Diagnosis Date  . Depression   . Psychosis   . PTSD (post-traumatic stress disorder)   . Seizure disorder     related to etoh seizure  . Alcohol abuse   . Schizoaffective disorder   . Anxiety     Past Surgical History  Procedure Laterality Date  . Foot surgery      right  . Hand surgery      Family History: No family history on file.  Social History:  reports that he has been  smoking Cigarettes.  He has a 36 pack-year smoking history. He has never used smokeless tobacco. He reports that he drinks about 50.4 ounces of alcohol per week. He reports that he uses illicit drugs (Marijuana) about once per week.  Additional Social History:  Alcohol / Drug Use Pain Medications: See MAR  Prescriptions: See MAR  Over the Counter: See MAR  History of alcohol / drug use?: Yes Longest period of sobriety (when/how long): When in detox  Negative Consequences of Use: Personal relationships Withdrawal Symptoms: Other (Comment) (No current w/d sxs ) Substance #1 Name of Substance 1: Alcohol  1 - Age of First Use: 15YOM  1 - Amount (size/oz): 4-24oz Beers 1 - Frequency: Daily  1 - Duration: On-going  1 - Last Use / Amount: 05/03/14 Substance #2 Name of Substance 2: THC  2 - Age of First Use: Teens  2 - Amount (size/oz): 3-5 "Joints"  2 - Frequency: Wkly  2 - Duration: On-going  2 - Last Use / Amount: 1 Wk Ago   CIWA: CIWA-Ar BP: 126/74 mmHg Pulse Rate: 67 Nausea and Vomiting: mild nausea with no vomiting Tactile Disturbances: moderately severe hallucinations Tremor: two Auditory Disturbances: very mild harshness or ability to frighten Paroxysmal Sweats: barely perceptible sweating, palms moist Visual Disturbances: mild sensitivity Anxiety: mildly anxious Headache, Fullness in Head: none present  Agitation: normal activity Orientation and Clouding of Sensorium: oriented and can do serial additions CIWA-Ar Total: 12 COWS:    PATIENT STRENGTHS: (choose at least two) Communication skills Supportive family/friends  Allergies: No Known Allergies  Home Medications:  (Not in a hospital admission)  OB/GYN Status:  No LMP for male patient.  General Assessment Data Location of Assessment: [facility] ED Is this a Tele or Face-to-Face Assessment?: Tele Assessment Is this an Initial Assessment or a Re-assessment for this encounter?: Initial Assessment Living Arrangements:  Alone Can pt return to current living arrangement?: Yes Admission Status: Voluntary Is patient capable of signing voluntary admission?: Yes Transfer from: Home Referral Source: Self/Family/Friend  Medical Screening Exam ([place]) Medical Exam completed: No Reason for MSE not completed: Other: (None )  [place] Living Arrangements: Alone Name of Psychiatrist: [place]  Name of Therapist: [facility]   Education Status Is patient currently in school?: No Current Grade: None  Highest grade of school patient has completed: None  Name of school: None  Contact person: None   Risk to self with the past 6 months Suicidal Ideation: Yes-Currently Present Suicidal Intent: No-Not Currently/Within Last 6 Months Is patient at risk for suicide?: Yes Suicidal Plan?: No-Not Currently/Within Last 6 Months Access to Means: Yes Specify Access to Suicidal Means: Sharps, Pills  What has been your use of drugs/alcohol within the last 12 months?: Abusing: alcohol, THC Previous Attempts/Gestures: Yes How many times?: 3 Other Self Harm Risks: None  Triggers for Past Attempts: Unpredictable Intentional Self Injurious Behavior: None Family Suicide History: No Recent stressful life event(s): Other (Comment) (Off meds x1 month, Chronic SA) Persecutory voices/beliefs?: No Depression: Yes Depression Symptoms: Loss of interest in usual pleasures;Feeling worthless/self pity;Despondent Substance abuse history and/or treatment for substance abuse?: Yes Suicide prevention information given to non-admitted patients: Not applicable  Risk to Others within the past 6 months Homicidal Ideation: No Thoughts of Harm to Others: No Current Homicidal Intent: No Current Homicidal Plan: No Access to Homicidal Means: No Identified Victim: None  History of harm to others?: No Assessment of Violence: None Noted Violent Behavior Description: None  Does patient have access to weapons?: No Criminal  Charges Pending?: No Does patient have a court date: No  Psychosis Hallucinations: Auditory;Visual Delusions: None noted  Mental Status Report Appear/Hygiene: Disheveled;In scrubs Eye Contact: Fair Motor Activity: Unremarkable Speech: Logical/coherent;Soft Level of Consciousness: Alert Mood: Depressed Affect: Blunted Anxiety Level: Minimal Thought Processes: Coherent;Relevant Judgement: Impaired Orientation: Person;Place;Time;Situation Obsessive Compulsive Thoughts/Behaviors: None  Cognitive Functioning Concentration: Decreased Memory: Recent Intact;Remote Intact IQ: Average Insight: Poor Impulse Control: Fair Appetite: Fair Weight Loss: 0 Weight Gain: 0 Sleep: No Change Total Hours of Sleep: 8 Vegetative Symptoms: Staying in bed;Decreased grooming  ADLScreening [facility] Assessment Services) Patient's cognitive ability adequate to safely complete daily activities?: Yes Patient able to express need for assistance with ADLs?: Yes Independently performs ADLs?: Yes (appropriate for developmental age)  Prior Inpatient Therapy Prior Inpatient Therapy: Yes Prior Therapy Dates: 2015 Prior Therapy Facilty/Provider[facility]  Reason for Treatment: Depression/SI/SA   Prior Outpatient Therapy Prior Outpatient Therapy: Yes Prior Therapy Dates: Current  Prior Therapy Facilty/Provider(s): [facility]  Reason for Treatment: Med mgt/Therapy   ADL Screening (condition at time of admission) Patient's cognitive ability adequate to safely complete daily activities?: Yes Is the patient deaf or have difficulty hearing?: No Does the patient have difficulty seeing, even when wearing glasses/contacts?: No Does the patient have difficulty concentrating, remembering, or making decisions?: Yes Patient able to express need  for assistance with ADLs?: Yes Does the patient have difficulty dressing or bathing?: No Independently performs ADLs?: Yes (appropriate for developmental age) Does the patient  have difficulty walking or climbing stairs?: No Weakness of Legs: None Weakness of Arms/Hands: None  Home Assistive Devices/Equipment Home Assistive Devices/Equipment: None  Therapy Consults (therapy consults require a physician order) PT Evaluation Needed: No OT Evalulation Needed: No SLP Evaluation Needed: No Abuse/Neglect Assessment (Assessment to be complete while patient is alone) Physical Abuse: Denies Verbal Abuse: Denies Sexual Abuse: Denies Exploitation of patient/patient's resources: Denies Self-Neglect: Denies Values / Beliefs Cultural Requests During Hospitalization: None Spiritual Requests During Hospitalization: None Consults Spiritual Care Consult Needed: No Social Work Consult Needed: No Regulatory affairs officer (For Healthcare) Does patient have an advance directive?: No Would patient like information on creating an advanced directive?: No - patient declined information Nutrition Screen- [facility] Patient's home diet: Regular  Additional Information 1:1 In Past 12 Months?: No CIRT Risk: No Elopement Risk: No Does patient have medical clearance?: Yes     Disposition:  Disposition Initial Assessment Completed for this Encounter: Yes Disposition of Patient: Inpatient treatment program Waylan Boga, NP accepted to OBS Unit #2) Type of inpatient treatment program: Adult  Girtha Rm 05/04/2014 7:20 AM

## 2014-05-05 DIAGNOSIS — F4310 Post-traumatic stress disorder, unspecified: Secondary | ICD-10-CM

## 2014-05-05 DIAGNOSIS — F411 Generalized anxiety disorder: Secondary | ICD-10-CM

## 2014-05-05 DIAGNOSIS — F259 Schizoaffective disorder, unspecified: Secondary | ICD-10-CM

## 2014-05-05 DIAGNOSIS — F1210 Cannabis abuse, uncomplicated: Secondary | ICD-10-CM

## 2014-05-05 DIAGNOSIS — F1020 Alcohol dependence, uncomplicated: Secondary | ICD-10-CM

## 2014-05-05 LAB — GC/CHLAMYDIA PROBE AMP
CT Probe RNA: NEGATIVE
GC Probe RNA: NEGATIVE

## 2014-05-05 NOTE — Progress Notes (Signed)
Patient alert and oriented. Denies withdrawal symptoms; CIWA 0. Patient concerned that his PTA medications have not been started. NP made aware. Patient states that he is ready to go home. Patient shared that he is followed by [facility] but is out of medications.

## 2014-05-05 NOTE — Progress Notes (Signed)
LCSW assessed patient for needs and community resources. Patient reports he is followed by [facility].  Contacted [facility] rep here at [facility], who is reviewing patient and also seeing it patient is eligible for Transitional Care Services attached to his Medicaid.    Plan for patient:  DC home once MD has seen patient.  [facility] working on other resources to help patient, he will continue to follow at [facility].    Lane Hacker, MSW Clinical Social Work:  TTS Dispositions [phone]

## 2014-05-05 NOTE — Progress Notes (Signed)
Writer and patient discussed strategies for maintaining his routine [facility] appointment. Patient states that he has missed his last several appointments and is out of medications. Patient was able to identify two family members that can remind him to make his appointments.  Patient eager for discharge. He has called family members and they will pick pt up and transport home. All paperwork reviewed. Patient verbalized discharge plan. He denies SI/Hi or psychosis.

## 2014-05-05 NOTE — Progress Notes (Signed)
Hurt INPATIENT:  Family/Significant Other Suicide Prevention Education  Suicide Prevention Education:  Patient Refusal for Family/Significant Other Suicide Prevention Education: The patient [name] has refused to provide written consent for family/significant other to be provided Family/Significant Other Suicide Prevention Education during admission and/or prior to discharge.  Physician notified.  Jamieon Lannen 05/05/2014, 12:47 PM

## 2014-05-05 NOTE — BHH Suicide Risk Assessment (Signed)
Suicide Risk Assessment  Discharge Assessment     Demographic Factors:  Male and Caucasian  Total Time spent with patient: 20 minutes  Psychiatric Specialty Exam:     Blood pressure 132/84, pulse 98, temperature 97.7 F (36.5 C), temperature source Oral, resp. rate 16, height 6' (1.829 m), weight 83.915 kg (185 lb), SpO2 98.00%.Body mass index is 25.08 kg/(m^2).  General Appearance: Fairly Groomed  Engineer, water::  Fair  Speech:  Clear and Coherent  Volume:  Normal  Mood:  Euthymic  Affect:  Restricted  Thought Process:  Coherent and Goal Directed  Orientation:  Full (Time, Place, and Person)  Thought Content:  plans as he moves on, relapse prevention plan  Suicidal Thoughts:  No  Homicidal Thoughts:  No  Memory:  Immediate;   Fair Recent;   Fair Remote;   Fair  Judgement:  Fair  Insight:  Present  Psychomotor Activity:  Normal  Concentration:  Fair  Recall:  [employer] of Knowledge:NA  Language: Fair  Akathisia:  No    AIMS (if indicated):     Assets:  Desire for Improvement Housing Social Support  Sleep:       Musculoskeletal: Strength & Muscle Tone: within normal limits Gait & Station: normal Patient leans: N/A   Mental Status Per Nursing Assessment::   On Admission:     Current Mental Status by Physician: IN full contact with reality. There are no active S/S of withdrawal. There are no active SI plans or intent. He is going to follow up with [facility] as a walk in to get his medications refilled   Loss Factors: NA  Historical Factors: NA  Risk Reduction Factors:   Sense of responsibility to family and Positive social support  Continued Clinical Symptoms:  Alcohol/Substance Abuse/Dependencies  Cognitive Features That Contribute To Risk:  Closed-mindedness Polarized thinking Thought constriction (tunnel vision)    Suicide Risk:  Minimal: No identifiable suicidal ideation.  Patients presenting with no risk factors but with morbid ruminations; may  be classified as minimal risk based on the severity of the depressive symptoms  Discharge Diagnoses:   AXIS I:  Schizoaffective Disorder, Alcohol Dependence AXIS II:  No diagnosis AXIS III:   Past Medical History  Diagnosis Date  . Depression   . Psychosis   . PTSD (post-traumatic stress disorder)   . Seizure disorder     related to etoh seizure  . Alcohol abuse   . Schizoaffective disorder   . Anxiety    AXIS IV:  other psychosocial or environmental problems AXIS V:  61-70 mild symptoms  Plan Of Care/Follow-up recommendations:  Activity:  as tolerated Diet:  regular Follow up [facility] Is patient on multiple antipsychotic therapies at discharge:  No   Has Patient had three or more failed trials of antipsychotic monotherapy by history:  No  Recommended Plan for Multiple Antipsychotic Therapies: NA    [name] A 05/05/2014, 12:45 PM

## 2014-05-05 NOTE — H&P (Signed)
[facility]: HPI/SRA " I have reviewed the tele assessment from the ED and I agree. Patient details updated to reflect current status."  Tele Assessment Note  [name] is a 43 y.o. male who voluntarily presents to [facility] with SI /Depression and requests alcohol detox. Pt is SI w/no specific plan to harm self. He has 3 past SI attempts by overdose which happened approx 6 yrs ago, triggered by his witnessing his fiance committing SI in front of him. Pt states that he's been off his meds x1 because he ran out and didn't refill them, pt.'s mental health provider is [place]. Pt is moderately anxious during interview and has some difficulty concentrating on questions posed to him. He endorses aud/visual hallucinations, stating that he is seeing shadow people, they are very graphic when they are speaking to him and they have any evil laugh--"you are stupid".  Pt reports that he drinks 4-24oz cans of beer, daily. His last drink was 05/03/14, he drank 4-24oz beers. He also uses 3-5 marijuana "joints" on a weekly basis and says his last was 1 week ago.  Axis I: Alcohol Dependence, Generalized Anxiety Disorder, Post Traumatic Stress Disorder, Schizoaffective Disorder and Cannabis Use D/O  Axis II: Deferred  Axis III:    Past Medical History    Diagnosis  Date    .  Depression     .  Psychosis     .  PTSD (post-traumatic stress disorder)     .  Seizure disorder       related to etoh seizure    .  Alcohol abuse     .  Schizoaffective disorder     .  Anxiety      Axis IV: other psychosocial or environmental problems, problems related to social environment and problems with primary support group  Axis V: 31-40 impairment in reality testing  Past Medical History:    Past Medical History    Diagnosis  Date    .  Depression     .  Psychosis     .  PTSD (post-traumatic stress disorder)     .  Seizure disorder       related to etoh seizure    .  Alcohol  abuse     .  Schizoaffective disorder     .  Anxiety        Past Surgical History    Procedure  Laterality  Date    .  Foot surgery        right    .  Hand surgery       Family History: No family history on file.  Social History: reports that he has been smoking Cigarettes. He has a 36 pack-year smoking history. He has never used smokeless tobacco. He reports that he drinks about 50.4 ounces of alcohol per week. He reports that he uses illicit drugs (Marijuana) about once per week.  Additional Social History: Alcohol / Drug Use  Pain Medications: See MAR  Prescriptions: See MAR  Over the Counter: See MAR  History of alcohol / drug use?: Yes  Longest period of sobriety (when/how long): When in detox  Negative Consequences of Use: Personal relationships  Withdrawal Symptoms: Other (Comment) (No current w/d sxs )  Substance #1  Name of  Substance 1: Alcohol  1 - Age of First Use: 15YOM  1 - Amount (size/oz): 4-24oz Beers  1 - Frequency: Daily  1 - Duration: On-going  1 - Last Use / Amount: 05/03/14  Substance #2  Name of Substance 2: THC  2 - Age of First Use: Teens  2 - Amount (size/oz): 3-5 "Joints"  2 - Frequency: Wkly  2 - Duration: On-going  2 - Last Use / Amount: 1 Wk Ago  CIWA: CIWA-Ar  BP: 126/74 mmHg  Pulse Rate: 67  Nausea and Vomiting: mild nausea with no vomiting  Tactile Disturbances: moderately severe hallucinations  Tremor: two  Auditory Disturbances: very mild harshness or ability to frighten  Paroxysmal Sweats: barely perceptible sweating, palms moist  Visual Disturbances: mild sensitivity  Anxiety: mildly anxious  Headache, Fullness in Head: none present  Agitation: normal activity  Orientation and Clouding of Sensorium: oriented and can do serial additions  CIWA-Ar Total: 12  COWS:  PATIENT STRENGTHS: (choose at least two)  Communication skills  Supportive family/friends  Allergies: No Known Allergies  Home Medications:  (Not in a hospital  admission)  OB/GYN Status: No LMP for male patient.  General Assessment Data  Location of Assessment: [facility]  Is this a Tele or Face-to-Face Assessment?: Tele Assessment  Is this an Initial Assessment or a Re-assessment for this encounter?: Initial Assessment  Living Arrangements: Alone  Can pt return to current living arrangement?: Yes  Admission Status: Voluntary  Is patient capable of signing voluntary admission?: Yes  Transfer from: Home  Referral Source: Self/Family/Friend  Medical Screening Exam ([place])  Medical Exam completed: No  Reason for MSE not completed: Other: (None )  [place]  Living Arrangements: Alone  Name of Psychiatrist: [facility]  Name of Therapist: [facility]  Education Status  Is patient currently in school?: No  Current Grade: None  Highest grade of school patient has completed: None  Name of school: None  Contact person: None  Risk to self with the past 6 months  Suicidal Ideation: Yes-Currently Present  Suicidal Intent: No-Not Currently/Within Last 6 Months  Is patient at risk for suicide?: Yes  Suicidal Plan?: No-Not Currently/Within Last 6 Months  Access to Means: Yes  Specify Access to Suicidal Means: Sharps, Pills  What has been your use of drugs/alcohol within the last 12 months?: Abusing: alcohol, THC  Previous Attempts/Gestures: Yes  How many times?: 3  Other Self Harm Risks: None  Triggers for Past Attempts: Unpredictable  Intentional Self Injurious Behavior: None  Family Suicide History: No  Recent stressful life event(s): Other (Comment) (Off meds x1 month, Chronic SA)  Persecutory voices/beliefs?: No  Depression: Yes  Depression Symptoms: Loss of interest in usual pleasures;Feeling worthless/self pity;Despondent  Substance abuse history and/or treatment for substance abuse?: Yes  Suicide prevention information given to non-admitted patients: Not applicable  Risk to Others within the past 6 months  Homicidal Ideation:  No  Thoughts of Harm to Others: No  Current Homicidal Intent: No  Current Homicidal Plan: No  Access to Homicidal Means: No  Identified Victim: None  History of harm to others?: No  Assessment of Violence: None Noted  Violent Behavior Description: None  Does patient have access to weapons?: No  Criminal Charges Pending?: No  Does patient have a court date: No  Psychosis  Hallucinations: Auditory;Visual  Delusions: None noted  Mental Status Report  Appear/Hygiene: Disheveled;In scrubs  Eye Contact: Fair  Motor Activity: Unremarkable  Speech: Logical/coherent;Soft  Level of Consciousness: Alert  Mood: Depressed  Affect: Blunted  Anxiety Level: Minimal  Thought Processes: Coherent;Relevant  Judgement: Impaired  Orientation: Person;Place;Time;Situation  Obsessive Compulsive Thoughts/Behaviors: None  Cognitive Functioning  Concentration: Decreased  Memory: Recent Intact;Remote Intact  IQ: Average  Insight: Poor  Impulse Control: Fair  Appetite: Fair  Weight Loss: 0  Weight Gain: 0  Sleep: No Change  Total Hours of Sleep: 8  Vegetative Symptoms: Staying in bed;Decreased grooming  ADLScreening [facility])  Patient's cognitive ability adequate to safely complete daily activities?: Yes  Patient able to express need for assistance with ADLs?: Yes  Independently performs ADLs?: Yes (appropriate for developmental age)  Prior Inpatient Therapy  Prior Inpatient Therapy: Yes  Prior Therapy Dates: 2015  Prior Therapy Facilty/Provider(s): [facility]  Reason for Treatment: Depression/SI/SA  Prior Outpatient Therapy  Prior Outpatient Therapy: Yes  Prior Therapy Dates: Current  Prior Therapy Facilty/Provider(s): [facility]  Reason for Treatment: Med mgt/Therapy  ADL Screening (condition at time of admission)  Patient's cognitive ability adequate to safely complete daily activities?: Yes  Is the patient deaf or have difficulty hearing?: No  Does the patient have difficulty  seeing, even when wearing glasses/contacts?: No  Does the patient have difficulty concentrating, remembering, or making decisions?: Yes  Patient able to express need for assistance with ADLs?: Yes  Does the patient have difficulty dressing or bathing?: No  Independently performs ADLs?: Yes (appropriate for developmental age)  Does the patient have difficulty walking or climbing stairs?: No  Weakness of Legs: None  Weakness of Arms/Hands: None  Home Assistive Devices/Equipment  Home Assistive Devices/Equipment: None  Therapy Consults (therapy consults require a physician order)  PT Evaluation Needed: No  OT Evalulation Needed: No  SLP Evaluation Needed: No  Abuse/Neglect Assessment (Assessment to be complete while patient is alone)  Physical Abuse: Denies  Verbal Abuse: Denies  Sexual Abuse: Denies  Exploitation of patient/patient's resources: Denies  Self-Neglect: Denies  Values / Beliefs  Cultural Requests During Hospitalization: None  Spiritual Requests During Hospitalization: None  Consults  Spiritual Care Consult Needed: No  Social Work Consult Needed: No  Regulatory affairs officer (For Healthcare)  Does patient have an advance directive?: No  Would patient like information on creating an advanced directive?: No - patient declined information  Nutrition Screen- [facility]  Patient's home diet: Regular  Additional Information  1:1 In Past 12 Months?: No  CIRT Risk: No  Elopement Risk: No  Does patient have medical clearance?: Yes  Suicide Risk Assessment  Admission Assessment     Nursing information obtained from:    Demographic factors:    Current Mental Status:    Loss Factors:    Historical Factors:    Risk Reduction Factors:    Total Time spent with patient: 30 minutes  CLINICAL FACTORS:   Alcohol/Substance Abuse/Dependencies  Psychiatric Specialty Exam:     Blood pressure 132/84, pulse 98, temperature 97.7 F (36.5 C), temperature source Oral, resp. rate  16, height 6' (1.829 m), weight 83.915 kg (185 lb), SpO2 98.00%.Body mass index is 25.08 kg/(m^2).  General Appearance: Disheveled  Eye Contact::  Minimal  Speech:  Slow and not spontaneous  Volume:  Decreased  Mood:  Anxious  Affect:  Restricted  Thought Process:  no spontaneous content  Orientation:  Other:  person place  Thought Content:  symptoms   Suicidal Thoughts:  No  Homicidal Thoughts:  No  Memory:  Immediate;   Poor Recent;   Poor Remote;   Poor  Judgement:  Fair  Insight:  Shallow  Psychomotor Activity:  Restlessness  Concentration:  Poor  Recall:  Poor  Fund of Knowledge:NA  Language: Fair  Akathisia:  No  Handed:    AIMS (if indicated):     Assets:  Desire for Improvement  Sleep:      Musculoskeletal: Strength & Muscle Tone: within normal limits Gait & Station: normal Patient leans: N/A  COGNITIVE FEATURES THAT CONTRIBUTE TO RISK:  Closed-mindedness Polarized thinking    SUICIDE RISK:   Mild:  Suicidal ideation of limited frequency, intensity, duration, and specificity.  There are no identifiable plans, no associated intent, mild dysphoria and related symptoms, good self-control (both objective and subjective assessment), few other risk factors, and identifiable protective factors, including available and accessible social support.  PLAN OF CARE  Admit to the observation unit. Identify and address needs for detox. Reassess and address the commodities. Identify need for further inpatient unit, and or referral to outpatient treatment/residential treatment program  Geralyn Flash A. Sabra Heck, M.D.

## 2014-05-12 ENCOUNTER — Emergency Department (HOSPITAL_COMMUNITY)
Admission: EM | Admit: 2014-05-12 | Discharge: 2014-05-15 | Payer: Medicare Other | Attending: Emergency Medicine | Admitting: Emergency Medicine

## 2014-05-12 ENCOUNTER — Encounter (HOSPITAL_COMMUNITY): Payer: Self-pay | Admitting: Emergency Medicine

## 2014-05-12 DIAGNOSIS — F10239 Alcohol dependence with withdrawal, unspecified: Secondary | ICD-10-CM

## 2014-05-12 DIAGNOSIS — F10939 Alcohol use, unspecified with withdrawal, unspecified: Secondary | ICD-10-CM | POA: Diagnosis not present

## 2014-05-12 DIAGNOSIS — Z8669 Personal history of other diseases of the nervous system and sense organs: Secondary | ICD-10-CM | POA: Diagnosis not present

## 2014-05-12 DIAGNOSIS — F4310 Post-traumatic stress disorder, unspecified: Secondary | ICD-10-CM

## 2014-05-12 DIAGNOSIS — F10230 Alcohol dependence with withdrawal, uncomplicated: Secondary | ICD-10-CM

## 2014-05-12 DIAGNOSIS — F1020 Alcohol dependence, uncomplicated: Secondary | ICD-10-CM | POA: Diagnosis present

## 2014-05-12 DIAGNOSIS — F10229 Alcohol dependence with intoxication, unspecified: Secondary | ICD-10-CM | POA: Insufficient documentation

## 2014-05-12 DIAGNOSIS — F1210 Cannabis abuse, uncomplicated: Secondary | ICD-10-CM

## 2014-05-12 DIAGNOSIS — F17200 Nicotine dependence, unspecified, uncomplicated: Secondary | ICD-10-CM | POA: Insufficient documentation

## 2014-05-12 DIAGNOSIS — F259 Schizoaffective disorder, unspecified: Secondary | ICD-10-CM | POA: Diagnosis not present

## 2014-05-12 DIAGNOSIS — F251 Schizoaffective disorder, depressive type: Secondary | ICD-10-CM

## 2014-05-12 DIAGNOSIS — R45851 Suicidal ideations: Secondary | ICD-10-CM | POA: Insufficient documentation

## 2014-05-12 DIAGNOSIS — R4689 Other symptoms and signs involving appearance and behavior: Secondary | ICD-10-CM

## 2014-05-12 DIAGNOSIS — R4589 Other symptoms and signs involving emotional state: Secondary | ICD-10-CM

## 2014-05-12 LAB — COMPREHENSIVE METABOLIC PANEL
ALT: 266 U/L — ABNORMAL HIGH (ref 0–53)
AST: 166 U/L — ABNORMAL HIGH (ref 0–37)
Albumin: 5 g/dL (ref 3.5–5.2)
Alkaline Phosphatase: 121 U/L — ABNORMAL HIGH (ref 39–117)
Anion gap: 30 — ABNORMAL HIGH (ref 5–15)
BUN: 11 mg/dL (ref 6–23)
CO2: 20 mEq/L (ref 19–32)
Calcium: 10.1 mg/dL (ref 8.4–10.5)
Chloride: 87 mEq/L — ABNORMAL LOW (ref 96–112)
Creatinine, Ser: 0.7 mg/dL (ref 0.50–1.35)
GFR calc Af Amer: >90 mL/min (ref >90)
GFR calc non Af Amer: >90 mL/min (ref >90)
Glucose, Bld: 78 mg/dL (ref 70–99)
Potassium: 4.3 mEq/L (ref 3.7–5.3)
Sodium: 137 mEq/L (ref 137–147)
Total Bilirubin: 1 mg/dL (ref 0.3–1.2)
Total Protein: 8.5 g/dL — ABNORMAL HIGH (ref 6.0–8.3)

## 2014-05-12 LAB — CBC
HCT: 47.9 % (ref 39.0–52.0)
Hemoglobin: 16.7 g/dL (ref 13.0–17.0)
MCH: 33.9 pg (ref 26.0–34.0)
MCHC: 34.9 g/dL (ref 30.0–36.0)
MCV: 97.2 fL (ref 78.0–100.0)
Platelets: 286 10*3/uL (ref 150–400)
RBC: 4.93 MIL/uL (ref 4.22–5.81)
RDW: 13.8 % (ref 11.5–15.5)
WBC: 6 10*3/uL (ref 4.0–10.5)

## 2014-05-12 LAB — RAPID URINE DRUG SCREEN, HOSP PERFORMED
Amphetamines: NOT DETECTED
Barbiturates: NOT DETECTED
Benzodiazepines: NOT DETECTED
Cocaine: NOT DETECTED
Opiates: NOT DETECTED
Tetrahydrocannabinol: POSITIVE — AB

## 2014-05-12 LAB — ETHANOL: Alcohol, Ethyl (B): 104 mg/dL — ABNORMAL HIGH (ref 0–11)

## 2014-05-12 MED ORDER — THIAMINE HCL 100 MG/ML IJ SOLN: 100.0000 mg | Freq: Every day | INTRAMUSCULAR | Status: DC

## 2014-05-12 MED ORDER — LORAZEPAM 1 MG PO TABS
0.0000 mg | ORAL_TABLET | Freq: Two times a day (BID) | ORAL | Status: DC
  Administered 2014-05-12: 2 mg via ORAL
  Filled 2014-05-12: qty 2

## 2014-05-12 MED ORDER — LORAZEPAM 1 MG PO TABS
0.0000 mg | ORAL_TABLET | Freq: Four times a day (QID) | ORAL | Status: DC
  Administered 2014-05-13: 2 mg via ORAL
  Administered 2014-05-13: 1 mg via ORAL
  Filled 2014-05-12: qty 1
  Filled 2014-05-12: qty 2

## 2014-05-12 MED ORDER — LORAZEPAM 1 MG PO TABS: 0.0000 mg | ORAL_TABLET | Freq: Two times a day (BID) | ORAL | Status: DC

## 2014-05-12 MED ORDER — LORAZEPAM 1 MG PO TABS: 0.0000 mg | ORAL_TABLET | Freq: Four times a day (QID) | ORAL | Status: DC

## 2014-05-12 MED ORDER — LORAZEPAM 2 MG/ML IJ SOLN: 0.0000 mg | Freq: Four times a day (QID) | INTRAMUSCULAR | Status: DC

## 2014-05-12 MED ORDER — LORAZEPAM 2 MG/ML IJ SOLN: 0.0000 mg | Freq: Two times a day (BID) | INTRAMUSCULAR | Status: DC

## 2014-05-12 MED ORDER — VITAMIN B-1 100 MG PO TABS
100.0000 mg | ORAL_TABLET | Freq: Every day | ORAL | Status: DC
  Administered 2014-05-12 – 2014-05-13 (×2): 100 mg via ORAL
  Filled 2014-05-12 (×3): qty 1

## 2014-05-12 NOTE — ED Provider Notes (Signed)
CSN: [id number]     Arrival date & time 05/12/14  1621 History   First MD Initiated Contact with Patient 05/12/14 1746     Chief Complaint  Patient presents with  . Suicidal  . Hallucinations  . ETOH detox      (Consider location/radiation/quality/duration/timing/severity/associated sxs/prior Treatment) HPI [name] is a(n) 43 y.o. male who presents to the ED with cc of depression, SI and ETOH withdrawal. The patient was discharged one month ago for the same . He has a pMH of depression, schizoaffective disorder and alcohol abuse. Pateitn states that instead fo following up with his psychiatrist after discharge, he stayed home and drank 6-8, 24 oz beers a day and did not take his medications. The patient states he plans to kill himself by "cutting myself." He states he is seeing "shadow people" who say mean  And demieaning things to him. These hallucinations are not new. He uses occasional marijuana. He denies other drug use. He Denies HI. Last etoh intake this morning.  HX of attempted suicide by overdose. Hx of fiance with completed suicide in front of the patient. C/O anxiety and tremors. Past Medical History  Diagnosis Date  . Depression   . Psychosis   . PTSD (post-traumatic stress disorder)   . Seizure disorder     related to etoh seizure  . Alcohol abuse   . Schizoaffective disorder   . Anxiety    Past Surgical History  Procedure Laterality Date  . Foot surgery      right  . Hand surgery     History reviewed. No pertinent family history. History  Substance Use Topics  . Smoking status: Current Every Day Smoker -- 1.50 packs/day for 24 years    Types: Cigarettes  . Smokeless tobacco: Never Used  . Alcohol Use: 50.4 oz/week    84 Cans of beer per week     Comment: 6-8 cans beer per day, 24 ounce cans    Review of Systems  Ten systems reviewed and are negative for acute change, except as noted in the HPI.    Allergies  Review of patient's allergies  indicates no known allergies.  Home Medications   Prior to Admission medications   Not on File   BP 136/75  Pulse 86  Temp(Src) 98.3 F (36.8 C) (Oral)  Resp 16  SpO2 96% Physical Exam  Nursing note and vitals reviewed. Constitutional: He is oriented to person, place, and time. He appears well-developed and well-nourished. No distress.  HENT:  Head: Normocephalic and atraumatic.  Eyes: Conjunctivae are normal. No scleral icterus.  Neck: Normal range of motion. Neck supple.  Cardiovascular: Normal rate, regular rhythm and normal heart sounds.   Pulmonary/Chest: Effort normal and breath sounds normal. No respiratory distress.  Abdominal: Soft. There is no tenderness.  Musculoskeletal: He exhibits no edema.  Neurological: He is alert and oriented to person, place, and time.  Skin: Skin is warm and dry. He is not diaphoretic.  Psychiatric: His behavior is normal.    ED Course  Procedures (including critical care time) Labs Review Labs Reviewed  URINE RAPID DRUG SCREEN (HOSP PERFORMED) - Abnormal; Notable for the following:    Tetrahydrocannabinol POSITIVE (*)    All other components within normal limits  CBC  COMPREHENSIVE METABOLIC PANEL  ETHANOL    Imaging Review No results found.   EKG Interpretation None      MDM   Final diagnoses:  Alcohol dependence with withdrawal with complication  Suicidal behavior  Patient with ETOh abuse.SI.  On [place] Medically clear for psych work up. Patient seen in shared visit with attending physician.      Margarita Mail, PA-C 05/13/14 0130

## 2014-05-12 NOTE — ED Notes (Signed)
Patient has one bag of belongings in locker 27. 

## 2014-05-12 NOTE — ED Notes (Signed)
Pt presents with complaint of SI, plan to cut wrist, denies HI, admits to AV hallucinations, seeing shadows and hearing people laughing at him.  Last SI attempt 6 year ago by OD on pills.  Feeling hopeless.  Pt reports diagnosed with Schizoaffective DO and alcohol abuse.  Pt states he has been off meds x 1 mos.  Admits to drinking 4-5/24 oz cans of beer per day and occasional marijuana.  Pt reports he was recently admitted to Observation Unit at [facility] x 3 wks ago.  Pt contracts for safety.  Will continue to monitor.  Pt cooperative and calm.

## 2014-05-12 NOTE — ED Notes (Signed)
Pt presents wanting ETOH detox.  Last drink this morning.  Pt reports that he typically drinks 4-5 24oz malt beverages per day.  Pt endorses SI w/ plan to cut self, auditory hallucinations telling him that he "is a piece of shit" and should hurt himself, and visual hallucinations that are "shadow people."  Pt has been seen recently for same and acknowledges that he doesn't follow up like he should.  Sts he has not taken medication in "a month or more" and does not follow up w/ therapist/counselor.

## 2014-05-12 NOTE — ED Provider Notes (Signed)
Patient requesting alcoholic detoxification. He presently feels "shaky" his last alcoholic drink was this morning. He also feels as a male 1 to harm himself plan is to cut himself. He is seeing "shadow people"which are saying "bad things about him. He has a history of schizoaffective disorder and has been off of his medications for one month Patient alert Glasgow Coma Score 15 tremulous. Walks without difficulty.  Ryan Dakin, MD 05/12/14 1929

## 2014-05-13 MED ORDER — HYDROXYZINE HCL 25 MG PO TABS
25.0000 mg | ORAL_TABLET | Freq: Four times a day (QID) | ORAL | Status: DC | PRN
  Administered 2014-05-13: 25 mg via ORAL
  Filled 2014-05-13: qty 1

## 2014-05-13 MED ORDER — CHLORDIAZEPOXIDE HCL 25 MG PO CAPS
25.0000 mg | ORAL_CAPSULE | Freq: Four times a day (QID) | ORAL | Status: AC
  Administered 2014-05-13 – 2014-05-14 (×4): 25 mg via ORAL
  Filled 2014-05-13 (×4): qty 1

## 2014-05-13 MED ORDER — SERTRALINE HCL 50 MG PO TABS
50.0000 mg | ORAL_TABLET | Freq: Every day | ORAL | Status: DC
  Administered 2014-05-13 – 2014-05-15 (×3): 50 mg via ORAL
  Filled 2014-05-13 (×3): qty 1

## 2014-05-13 MED ORDER — THIAMINE HCL 100 MG/ML IJ SOLN: 100.0000 mg | Freq: Once | INTRAMUSCULAR | Status: AC

## 2014-05-13 MED ORDER — ADULT MULTIVITAMIN W/MINERALS CH
1.0000 | ORAL_TABLET | Freq: Every day | ORAL | Status: DC
  Administered 2014-05-13 – 2014-05-15 (×3): 1 via ORAL
  Filled 2014-05-13 (×3): qty 1

## 2014-05-13 MED ORDER — CHLORDIAZEPOXIDE HCL 25 MG PO CAPS
25.0000 mg | ORAL_CAPSULE | Freq: Three times a day (TID) | ORAL | Status: AC
  Administered 2014-05-14 – 2014-05-15 (×3): 25 mg via ORAL
  Filled 2014-05-13 (×3): qty 1

## 2014-05-13 MED ORDER — QUETIAPINE FUMARATE 100 MG PO TABS
100.0000 mg | ORAL_TABLET | Freq: Two times a day (BID) | ORAL | Status: DC
  Administered 2014-05-13 – 2014-05-15 (×4): 100 mg via ORAL
  Filled 2014-05-13 (×4): qty 1

## 2014-05-13 MED ORDER — CHLORDIAZEPOXIDE HCL 25 MG PO CAPS
25.0000 mg | ORAL_CAPSULE | Freq: Four times a day (QID) | ORAL | Status: DC | PRN
  Administered 2014-05-13: 25 mg via ORAL
  Filled 2014-05-13: qty 1

## 2014-05-13 MED ORDER — ONDANSETRON 4 MG PO TBDP
4.0000 mg | ORAL_TABLET | Freq: Four times a day (QID) | ORAL | Status: DC | PRN
  Administered 2014-05-13: 4 mg via ORAL
  Filled 2014-05-13: qty 1

## 2014-05-13 MED ORDER — CARBAMAZEPINE 200 MG PO TABS
200.0000 mg | ORAL_TABLET | Freq: Every day | ORAL | Status: DC
  Administered 2014-05-13 – 2014-05-14 (×2): 200 mg via ORAL
  Filled 2014-05-13 (×3): qty 1

## 2014-05-13 MED ORDER — CHLORDIAZEPOXIDE HCL 25 MG PO CAPS: 25.0000 mg | ORAL_CAPSULE | ORAL | Status: DC

## 2014-05-13 MED ORDER — VITAMIN B-1 100 MG PO TABS
100.0000 mg | ORAL_TABLET | Freq: Every day | ORAL | Status: DC
  Administered 2014-05-14 – 2014-05-15 (×2): 100 mg via ORAL
  Filled 2014-05-13: qty 1

## 2014-05-13 MED ORDER — LOPERAMIDE HCL 2 MG PO CAPS
2.0000 mg | ORAL_CAPSULE | ORAL | Status: DC | PRN
  Administered 2014-05-13: 4 mg via ORAL
  Filled 2014-05-13: qty 1
  Filled 2014-05-13: qty 2

## 2014-05-13 MED ORDER — CHLORDIAZEPOXIDE HCL 25 MG PO CAPS
25.0000 mg | ORAL_CAPSULE | Freq: Once | ORAL | Status: AC
  Administered 2014-05-13: 25 mg via ORAL
  Filled 2014-05-13: qty 1

## 2014-05-13 MED ORDER — CHLORDIAZEPOXIDE HCL 25 MG PO CAPS: 25.0000 mg | ORAL_CAPSULE | Freq: Every day | ORAL | Status: DC

## 2014-05-13 NOTE — BH Assessment (Signed)
Clinician consulted with Dr. Adele Schilder who recommends patient for inpatient admission. Per Randall Hiss, [facility] no appropriate beds available at [facility]. TTS will seek placement.   Rigoberto Noel, MSW, LCSW Triage Specialist [phone]

## 2014-05-13 NOTE — ED Provider Notes (Signed)
Medical screening examination/treatment/procedure(s) were conducted as a shared visit with non-physician practitioner(s) and myself.  I personally evaluated the patient during the encounter.   EKG Interpretation None       [name], MD 05/13/14 [phone]

## 2014-05-13 NOTE — ED Notes (Signed)
Patient presents guarded and anxious, denies HI; endorses AH of negative voices, VH of shadows, positive for SI with plan to cut wrist. NAD

## 2014-05-13 NOTE — BH Assessment (Signed)
Assessment Note  [name] is an 43 y.o. male who presents voluntarily to [facility] due to alcohol withdrawal symptoms and suicidal ideation. Patient endorses anxiety and depression symptoms such as loss of appetite, isolation, irritability and fatigue. Patient reported drinking 4-5 24 oz beers daily since discharge from [facility] a week ago. Patient stated last drink was on 05/12/14.  Patient also reported occasional marijuana use. Patient endorses w/d symptoms such as weakness, sweats and chills. Patient stated hx of w/d seizures with last occurrence years ago. Patient also admitted to hx of blackouts. Patient also reported VH by seeing shadows and feeling like people are around him. Patient reported experiencing this during assessment.   Patient was discharged about a week ago from [facility] Observation unit. Patient also has multiple admissions to [facility] psych unit with most recent in June 2015.Patient reported being prescribed Zoloft, Seroquel and Tegretol from [place]. Patient stated last dosage was over a month ago. Patient received medication management and therapy from [facility].   Patient lives alone but identifies brother and sister as local supports.   Axis I: Schizoaffective Disorder and Alcohol Use Disorder, Severe.  Past Medical History:  Past Medical History  Diagnosis Date  . Depression   . Psychosis   . PTSD (post-traumatic stress disorder)   . Seizure disorder     related to etoh seizure  . Alcohol abuse   . Schizoaffective disorder   . Anxiety     Past Surgical History  Procedure Laterality Date  . Foot surgery      right  . Hand surgery      Family History: History reviewed. No pertinent family history.  Social History:  reports that he has been smoking Cigarettes.  He has a 36 pack-year smoking history. He has never used smokeless tobacco. He reports that he drinks about 50.4 ounces of alcohol per week. He reports that he uses illicit drugs (Marijuana) about once per  week.  Additional Social History:     CIWA: CIWA-Ar BP: 137/89 mmHg Pulse Rate: 94 Nausea and Vomiting: no nausea and no vomiting Tactile Disturbances: none Tremor: three Auditory Disturbances: not present Paroxysmal Sweats: barely perceptible sweating, palms moist Visual Disturbances: not present Anxiety: two Headache, Fullness in Head: none present Agitation: normal activity Orientation and Clouding of Sensorium: cannot do serial additions or is uncertain about date CIWA-Ar Total: 7 COWS:    Allergies: No Known Allergies  Home Medications:  (Not in a hospital admission)  OB/GYN Status:  No LMP for male patient.  General Assessment Data Location of Assessment: [facility] ED Is this a Tele or Face-to-Face Assessment?: Face-to-Face Is this an Initial Assessment or a Re-assessment for this encounter?: Initial Assessment Living Arrangements: Alone Can pt return to current living arrangement?: Yes Admission Status: Voluntary Is patient capable of signing voluntary admission?: Yes Transfer from: [facility] Referral Source: Self/Family/Friend  Medical Screening Exam ([place]) Medical Exam completed: No Reason for MSE not completed:  (NA)  [facility] Crisis Care Plan Living Arrangements: Alone Name of Psychiatrist: Beverly Sessions  Name of Therapist: [facility]      Risk to self with the past 6 months Suicidal Ideation: Yes-Currently Present Suicidal Intent: Yes-Currently Present Is patient at risk for suicide?: Yes Suicidal Plan?: No Access to Means: No Specify Access to Suicidal Means: NA What has been your use of drugs/alcohol within the last 12 months?: etoh and marijuana Previous Attempts/Gestures: Yes How many times?: 1 Other Self Harm Risks: none reported Triggers for Past Attempts: Unpredictable Intentional Self  Injurious Behavior: None Family Suicide History: Unknown Recent stressful life event(s):  (unk) Persecutory voices/beliefs?: No Depression:  Yes Depression Symptoms: Despondent;Isolating;Feeling worthless/self pity Substance abuse history and/or treatment for substance abuse?: Yes Suicide prevention information given to non-admitted patients: Not applicable  Risk to Others within the past 6 months Homicidal Ideation: No Thoughts of Harm to Others: No Current Homicidal Intent: No Current Homicidal Plan: No Access to Homicidal Means: No Identified Victim: NA History of harm to others?: No Assessment of Violence: None Noted Violent Behavior Description: Patient calm and cooperative at assessment.  Does patient have access to weapons?: No Criminal Charges Pending?: No Does patient have a court date: No  Psychosis Hallucinations: Visual Delusions: None noted  Mental Status Report Appear/Hygiene: In scrubs Eye Contact: Fair Motor Activity: Unremarkable Speech: Logical/coherent Level of Consciousness: Alert Mood: Depressed Affect: Anxious Anxiety Level: Moderate Thought Processes: Relevant;Coherent Judgement: Impaired Orientation: Person;Place;Time;Situation Obsessive Compulsive Thoughts/Behaviors: None  Cognitive Functioning Concentration: Normal Memory: Recent Intact;Remote Intact IQ: Average Insight: Poor Impulse Control: Poor Appetite: Poor Weight Loss:  (unk) Weight Gain:  (unk) Sleep: No Change Total Hours of Sleep:  (Patient stated "I pass out when I drink.") Vegetative Symptoms: None  ADLScreening [facility] Assessment Services) Patient's cognitive ability adequate to safely complete daily activities?: Yes Patient able to express need for assistance with ADLs?: Yes Independently performs ADLs?: Yes (appropriate for developmental age)  Prior Inpatient Therapy Prior Inpatient Therapy: Yes Prior Therapy Dates: 01/2014, multiple Prior Therapy Facilty/Provider(s): [facility]  Reason for Treatment: Depression/SI/SA   Prior Outpatient Therapy Prior Outpatient Therapy: Yes Prior Therapy Dates: Current  Prior  Therapy Facilty/Provider(s): [facility]  Reason for Treatment: Med mgt/Therapy   ADL Screening (condition at time of admission) Patient's cognitive ability adequate to safely complete daily activities?: Yes Is the patient deaf or have difficulty hearing?: No Does the patient have difficulty seeing, even when wearing glasses/contacts?: No Does the patient have difficulty concentrating, remembering, or making decisions?: No Patient able to express need for assistance with ADLs?: Yes Does the patient have difficulty dressing or bathing?: No Independently performs ADLs?: Yes (appropriate for developmental age)       Abuse/Neglect Assessment (Assessment to be complete while patient is alone) Physical Abuse: Denies Verbal Abuse: Denies Sexual Abuse: Denies Exploitation of patient/patient's resources: Denies Self-Neglect: Denies Values / Beliefs Cultural Requests During Hospitalization: None Spiritual Requests During Hospitalization: None   Advance Directives (For Healthcare) Does patient have an advance directive?: No Would patient like information on creating an advanced directive?: No - patient declined information    Additional Information 1:1 In Past 12 Months?: No CIRT Risk: No Elopement Risk: No Does patient have medical clearance?: Yes    Disposition:  Clinician consulted with Dr. Adele Schilder who recommends patient for inpatient admission. Per Randall Hiss, [facility] no appropriate beds available at [facility]. TTS will seek placement.   Disposition Initial Assessment Completed for this Encounter: Yes Disposition of Patient: Inpatient treatment program Type of inpatient treatment program: Adult  On Site Evaluation by:   Reviewed with Physician:    Essie Christine 05/13/2014 11:39 AM

## 2014-05-13 NOTE — Progress Notes (Signed)
MHT was contacted by Nicole Kindred, RN at [facility] inquiring about cognitive status for an acute unit.  MHT answered questions after speaking with Denman George, RN.  Pt has been accepted to [facility] by Dr.Lobao to room 108B.  Pt can arrive at 8am.  RN report [phone].  Wyvonnia Dusky, MHT/NS

## 2014-05-13 NOTE — Consult Note (Signed)
[facility] Consult   Reason for Consult:  Suicidal ideation and alcohol withdrawals Referring Physician:  EDP  Masaji Billups is an 43 y.o. male. Total Time spent with patient: 20 minutes  Assessment: AXIS I:  Alcohol Abuse and Schizoaffective Disorder AXIS II:  Deferred AXIS III:   Past Medical History  Diagnosis Date  . Depression   . Psychosis   . PTSD (post-traumatic stress disorder)   . Seizure disorder     related to etoh seizure  . Alcohol abuse   . Schizoaffective disorder   . Anxiety    AXIS IV:  other psychosocial or environmental problems, problems related to social environment and problems with primary support group AXIS V:  11-20 some danger of hurting self or others possible OR occasionally fails to maintain minimal personal hygiene OR gross impairment in communication  Plan:  Recommend psychiatric Inpatient admission when medically cleared.  Subjective:   Nick Stults is a 43 y.o. male patient admitted with alcohol withdrawal and suicidal thoughts.  HPI:  Patient is a 43 year old man presented to emergency room because of alcohol withdrawal and suicidal thoughts.  The patient was recently discharged from behavior [facility] however relapsed into his drinking.  He is drinking 4-5 24 ounce beer daily.  He reports symptoms of weakness, sweating, chills, tremors and shakes.  He is also using occasional marijuana.  Patient has stopped taking his medication one month ago.  He was discharged also in June 2015 on Seroquel Zoloft and Tegretol but did not kept appointment at [facility].  When past few weeks he has hallucination paranoia and auditory hallucination.  Is not comfortable around people.  Patient has history of withdrawal seizures in the past.  Patient lives alone however he has a brother and sister has a social support.  HPI Elements:   Location:  Severe anxiety and withdrawal symptoms depression and paranoia. Quality:  Poor. Severity:   Moderate. Duration:  2 months. Context:  Noncompliant with medication.  Past Psychiatric History:  Past Medical History  Diagnosis Date  . Depression   . Psychosis   . PTSD (post-traumatic stress disorder)   . Seizure disorder     related to etoh seizure  . Alcohol abuse   . Schizoaffective disorder   . Anxiety     reports that he has been smoking Cigarettes.  He has a 36 pack-year smoking history. He has never used smokeless tobacco. He reports that he drinks about 50.4 ounces of alcohol per week. He reports that he uses illicit drugs (Marijuana) about once per week. History reviewed. No pertinent family history. Family History Substance Abuse: Yes, Describe: Family Supports: Yes, List: (brother, sister, mother) Living Arrangements: Alone Can pt return to current living arrangement?: Yes Abuse/Neglect [facility]) Physical Abuse: Denies Verbal Abuse: Denies Sexual Abuse: Denies Allergies:  No Known Allergies  ACT Assessment Complete:  Yes:    Educational Status    Risk to Self: Risk to self with the past 6 months Suicidal Ideation: Yes-Currently Present Suicidal Intent: Yes-Currently Present Is patient at risk for suicide?: Yes Suicidal Plan?: No Access to Means: No Specify Access to Suicidal Means: NA What has been your use of drugs/alcohol within the last 12 months?: etoh and marijuana Previous Attempts/Gestures: Yes How many times?: 1 Other Self Harm Risks: none reported Triggers for Past Attempts: Unpredictable Intentional Self Injurious Behavior: None Family Suicide History: Unknown Recent stressful life event(s):  (unk) Persecutory voices/beliefs?: No Depression: Yes Depression Symptoms: Despondent;Isolating;Feeling worthless/self pity Substance abuse history and/or  treatment for substance abuse?: Yes Suicide prevention information given to non-admitted patients: Not applicable  Risk to Others: Risk to Others within the past 6 months Homicidal Ideation:  No Thoughts of Harm to Others: No Current Homicidal Intent: No Current Homicidal Plan: No Access to Homicidal Means: No Identified Victim: NA History of harm to others?: No Assessment of Violence: None Noted Violent Behavior Description: Patient calm and cooperative at assessment.  Does patient have access to weapons?: No Criminal Charges Pending?: No Does patient have a court date: No  Abuse: Abuse/Neglect Assessment (Assessment to be complete while patient is alone) Physical Abuse: Denies Verbal Abuse: Denies Sexual Abuse: Denies Exploitation of patient/patient's resources: Denies Self-Neglect: Denies  Prior Inpatient Therapy: Prior Inpatient Therapy Prior Inpatient Therapy: Yes Prior Therapy Dates: 12/2013, multiple Prior Therapy Facilty/Provider([facility]): [facility]  Reason for Treatment: Depression/SI/SA   Prior Outpatient Therapy: Prior Outpatient Therapy Prior Outpatient Therapy: Yes Prior Therapy Dates: Current  Prior Therapy Facilty/Provider(s): [facility]  Reason for Treatment: Med mgt/Therapy   Additional Information: Additional Information 1:1 In Past 12 Months?: No CIRT Risk: No Elopement Risk: No Does patient have medical clearance?: Yes                  Objective: Blood pressure 130/88, pulse 92, temperature 98 F (36.7 C), temperature source Oral, resp. rate 20, SpO2 99.00%.There is no weight on file to calculate BMI. Results for orders placed during the hospital encounter of 05/12/14 (from the past 72 hour(s))  CBC     Status: None   Collection Time    05/12/14  6:33 PM      Result Value Ref Range   WBC 6.0  4.0 - 10.5 K/uL   RBC 4.93  4.22 - 5.81 MIL/uL   Hemoglobin 16.7  13.0 - 17.0 g/dL   HCT 47.9  39.0 - 52.0 %   MCV 97.2  78.0 - 100.0 fL   MCH 33.9  26.0 - 34.0 pg   MCHC 34.9  30.0 - 36.0 g/dL   RDW 13.8  11.5 - 15.5 %   Platelets 286  150 - 400 K/uL  COMPREHENSIVE METABOLIC PANEL     Status: Abnormal   Collection Time    05/12/14  6:33 PM       Result Value Ref Range   Sodium 137  137 - 147 mEq/L   Potassium 4.3  3.7 - 5.3 mEq/L   Chloride 87 (*) 96 - 112 mEq/L   CO2 20  19 - 32 mEq/L   Glucose, Bld 78  70 - 99 mg/dL   BUN 11  6 - 23 mg/dL   Creatinine, Ser 0.70  0.50 - 1.35 mg/dL   Calcium 10.1  8.4 - 10.5 mg/dL   Total Protein 8.5 (*) 6.0 - 8.3 g/dL   Albumin 5.0  3.5 - 5.2 g/dL   AST 166 (*) 0 - 37 U/L   ALT 266 (*) 0 - 53 U/L   Alkaline Phosphatase 121 (*) 39 - 117 U/L   Total Bilirubin 1.0  0.3 - 1.2 mg/dL   GFR calc non Af Amer >90  >90 mL/min   GFR calc Af Amer >90  >90 mL/min   Comment: (NOTE)     The eGFR has been calculated using the CKD EPI equation.     This calculation has not been validated in all clinical situations.     eGFR's persistently <90 mL/min signify possible Chronic Kidney     Disease.   Anion gap 30 (*)  5 - 15  ETHANOL     Status: Abnormal   Collection Time    05/12/14  6:33 PM      Result Value Ref Range   Alcohol, Ethyl (B) 104 (*) 0 - 11 mg/dL   Comment:            LOWEST DETECTABLE LIMIT FOR     SERUM ALCOHOL IS 11 mg/dL     FOR MEDICAL PURPOSES ONLY  URINE RAPID DRUG SCREEN (HOSP PERFORMED)     Status: Abnormal   Collection Time    05/12/14  6:37 PM      Result Value Ref Range   Opiates NONE DETECTED  NONE DETECTED   Cocaine NONE DETECTED  NONE DETECTED   Benzodiazepines NONE DETECTED  NONE DETECTED   Amphetamines NONE DETECTED  NONE DETECTED   Tetrahydrocannabinol POSITIVE (*) NONE DETECTED   Barbiturates NONE DETECTED  NONE DETECTED   Comment:            DRUG SCREEN FOR MEDICAL PURPOSES     ONLY.  IF CONFIRMATION IS NEEDED     FOR ANY PURPOSE, NOTIFY LAB     WITHIN 5 DAYS.                LOWEST DETECTABLE LIMITS     FOR URINE DRUG SCREEN     Drug Class       Cutoff (ng/mL)     Amphetamine      1000     Barbiturate      200     Benzodiazepine   200     Tricyclics       300     Opiates          300     Cocaine          300     THC              50   Labs are reviewed  and are pertinent for blood alcohol level 104 UDS is positive for marijuana.  Current Facility-Administered Medications  Medication Dose Route Frequency Provider Last Rate Last Dose  . chlordiazePOXIDE (LIBRIUM) capsule 25 mg  25 mg Oral Q6H PRN Cleotis Nipper, MD      . chlordiazePOXIDE (LIBRIUM) capsule 25 mg  25 mg Oral QID Cleotis Nipper, MD   25 mg at 05/13/14 1301   Followed by  . [START ON 05/14/2014] chlordiazePOXIDE (LIBRIUM) capsule 25 mg  25 mg Oral TID Cleotis Nipper, MD       Followed by  . [START ON 05/15/2014] chlordiazePOXIDE (LIBRIUM) capsule 25 mg  25 mg Oral BH-qamhs Cleotis Nipper, MD       Followed by  . [START ON 05/16/2014] chlordiazePOXIDE (LIBRIUM) capsule 25 mg  25 mg Oral Daily Cleotis Nipper, MD      . hydrOXYzine (ATARAX/VISTARIL) tablet 25 mg  25 mg Oral Q6H PRN Cleotis Nipper, MD      . loperamide (IMODIUM) capsule 2-4 mg  2-4 mg Oral PRN Cleotis Nipper, MD      . multivitamin with minerals tablet 1 tablet  1 tablet Oral Daily Cleotis Nipper, MD   1 tablet at 05/13/14 1300  . ondansetron (ZOFRAN-ODT) disintegrating tablet 4 mg  4 mg Oral Q6H PRN Cleotis Nipper, MD   4 mg at 05/13/14 1300  . thiamine (VITAMIN B-1) tablet 100 mg  100 mg Oral Daily Arthor Captain, PA-C   100  mg at 05/13/14 1031  . [START ON 05/14/2014] thiamine (VITAMIN B-1) tablet 100 mg  100 mg Oral Daily Kathlee Nations, MD       No current outpatient prescriptions on file.    Psychiatric Specialty Exam:     Blood pressure 130/88, pulse 92, temperature 98 F (36.7 C), temperature source Oral, resp. rate 20, SpO2 99.00%.There is no weight on file to calculate BMI.  General Appearance: Disheveled, Fairly Groomed and Guarded  Engineer, water::  Fair  Speech:  Slow  Volume:  Decreased  Mood:  Depressed, Dysphoric and Hopeless  Affect:  Constricted and Depressed  Thought Process:  Irrelevant and Loose  Orientation:  Full (Time, Place, and Person)  Thought Content:  Hallucinations: Auditory, Paranoid Ideation  and Rumination  Suicidal Thoughts:  Yes.  without intent/plan  Homicidal Thoughts:  No  Memory:  Immediate;   Fair Recent;   Fair Remote;   Fair  Judgement:  Impaired  Insight:  Lacking  Psychomotor Activity:  Decreased, Restlessness and Tremor  Concentration:  Fair  Recall:  [place]: Fair  Akathisia:  No  Handed:  Right  AIMS (if indicated):     Assets:  Desire for Improvement Social Support  Sleep:      Musculoskeletal: Strength & Muscle Tone: within normal limits Gait & Station: normal Patient leans: N/A  Treatment Plan Summary: Daily contact with patient to assess and evaluate symptoms and progress in treatment Medication management, Start librium protocol, Seroquel, tegretol and Zoloft. Monitor progress and stabilization.  Brigham Cobbins T. 05/13/2014 5:17 PM

## 2014-05-13 NOTE — Progress Notes (Signed)
[facility] has been recommended by psychiatrist, [facility] currently does not have an appropriate bed available for pt. and the following facilities have been contacted regarding bed availability on pt's behalf:  [facility]- per [place] at [place] per [place] at [facility]- no [facility]- per [place] only 1 gero bed available [facility]- per Minette Brine at [facility]- have a number of [facility] referrals that have still not been reviewed [place]- per Zigmund Daniel at [facility]- per [facility] available, referral faxed Dorothyann Peng- per Myrna Blazer will let TTS know status of bed availability [facility]- per Lattie Haw can fax, referral faxed [facility]- per [place] at [place]- no [place]- only Therapist, art and male beds [facility]- per Elmyra Ricks can fax for review, referral faxed [facility] Motts- per [facility] can fax, referral faxed [facility]- per Mel[facility] at [facility]- per Paula Libra at [facility]- per Aimee at [facility] Disposition [facility]

## 2014-05-13 NOTE — Progress Notes (Signed)
1733- per Elmyra Ricks at [facility] pt declined d/t hx of detox seizures and they are not a "medical" facility.   Rick Duff Disposition [facility]

## 2014-05-14 ENCOUNTER — Encounter (HOSPITAL_COMMUNITY): Payer: Self-pay | Admitting: Registered Nurse

## 2014-05-14 DIAGNOSIS — F259 Schizoaffective disorder, unspecified: Secondary | ICD-10-CM

## 2014-05-14 DIAGNOSIS — R45851 Suicidal ideations: Secondary | ICD-10-CM

## 2014-05-14 DIAGNOSIS — F1010 Alcohol abuse, uncomplicated: Secondary | ICD-10-CM

## 2014-05-14 MED ORDER — SERTRALINE HCL 50 MG PO TABS: 50.0000 mg | ORAL_TABLET | Freq: Every day | ORAL | Status: DC

## 2014-05-14 MED ORDER — QUETIAPINE FUMARATE 100 MG PO TABS: 100.0000 mg | ORAL_TABLET | Freq: Two times a day (BID) | ORAL | Status: DC

## 2014-05-14 MED ORDER — CARBAMAZEPINE 200 MG PO TABS
200.0000 mg | ORAL_TABLET | Freq: Every day | ORAL | Status: DC
Start: 2014-05-14 — End: 2014-07-07

## 2014-05-14 NOTE — Consult Note (Signed)
[facility] Follow Up Psychiatry Consult   Reason for Consult:  Suicidal ideation and alcohol withdrawals Referring Physician:  EDP  [name] is an 43 y.o. male. Total Time spent with patient: 20 minutes  Assessment: AXIS I:  Alcohol Abuse and Schizoaffective Disorder AXIS II:  Deferred AXIS III:   Past Medical History  Diagnosis Date  . Depression   . Psychosis   . PTSD (post-traumatic stress disorder)   . Seizure disorder     related to etoh seizure  . Alcohol abuse   . Schizoaffective disorder   . Anxiety    AXIS IV:  other psychosocial or environmental problems, problems related to social environment and problems with primary support group AXIS V:  11-20 some danger of hurting self or others possible OR occasionally fails to maintain minimal personal hygiene OR gross impairment in communication  Plan:  Recommend psychiatric Inpatient admission when medically cleared.  Subjective:   [name] is a 43 y.o. male patient admitted with alcohol withdrawal and suicidal thoughts.  HPI: Patient states that he is feeling a little better today.  Patient continues to endorse hearing voices and feeling suicidal.  "I still feel pretty helpless"     HPI Elements:   Location:  Severe anxiety and withdrawal symptoms depression and paranoia. Quality:  Poor. Severity:  Moderate. Duration:  2 months. Context:  Noncompliant with medication.  Past Psychiatric History:  Past Medical History  Diagnosis Date  . Depression   . Psychosis   . PTSD (post-traumatic stress disorder)   . Seizure disorder     related to etoh seizure  . Alcohol abuse   . Schizoaffective disorder   . Anxiety     reports that he has been smoking Cigarettes.  He has a 36 pack-year smoking history. He has never used smokeless tobacco. He reports that he drinks about 50.4 ounces of alcohol per week. He reports that he uses illicit drugs (Marijuana) about once per week. History reviewed. No pertinent family  history. Family History Substance Abuse: Yes, Describe: Family Supports: Yes, List: (brother, sister, mother) Living Arrangements: Alone Can pt return to current living arrangement?: Yes Abuse/Neglect [facility]) Physical Abuse: Denies Verbal Abuse: Denies Sexual Abuse: Denies Allergies:  No Known Allergies  ACT Assessment Complete:  Yes:    Educational Status    Risk to Self: Risk to self with the past 6 months Suicidal Ideation: Yes-Currently Present Suicidal Intent: Yes-Currently Present Is patient at risk for suicide?: Yes Suicidal Plan?: No Access to Means: No Specify Access to Suicidal Means: NA What has been your use of drugs/alcohol within the last 12 months?: etoh and marijuana Previous Attempts/Gestures: Yes How many times?: 1 Other Self Harm Risks: none reported Triggers for Past Attempts: Unpredictable Intentional Self Injurious Behavior: None Family Suicide History: Unknown Recent stressful life event(s):  (unk) Persecutory voices/beliefs?: No Depression: Yes Depression Symptoms: Despondent;Isolating;Feeling worthless/self pity Substance abuse history and/or treatment for substance abuse?: Yes Suicide prevention information given to non-admitted patients: Not applicable  Risk to Others: Risk to Others within the past 6 months Homicidal Ideation: No Thoughts of Harm to Others: No Current Homicidal Intent: No Current Homicidal Plan: No Access to Homicidal Means: No Identified Victim: NA History of harm to others?: No Assessment of Violence: None Noted Violent Behavior Description: Patient calm and cooperative at assessment.  Does patient have access to weapons?: No Criminal Charges Pending?: No Does patient have a court date: No  Abuse: Abuse/Neglect Assessment (Assessment to be complete while patient is alone)  Physical Abuse: Denies Verbal Abuse: Denies Sexual Abuse: Denies Exploitation of patient/patient's resources: Denies Self-Neglect: Denies  Prior  Inpatient Therapy: Prior Inpatient Therapy Prior Inpatient Therapy: Yes Prior Therapy Dates: 12/2013, multiple Prior Therapy Facilty/Provider(s): [facility]  Reason for Treatment: Depression/SI/SA   Prior Outpatient Therapy: Prior Outpatient Therapy Prior Outpatient Therapy: Yes Prior Therapy Dates: Current  Prior Therapy Facilty/Provider(s): [facility]  Reason for Treatment: Med mgt/Therapy   Additional Information: Additional Information 1:1 In Past 12 Months?: No CIRT Risk: No Elopement Risk: No Does patient have medical clearance?: Yes      Objective: Blood pressure 127/76, pulse 111, temperature 97.9 F (36.6 C), temperature source Oral, resp. rate 16, SpO2 98.00%.There is no weight on file to calculate BMI. Results for orders placed during the hospital encounter of 05/12/14 (from the past 72 hour(s))  CBC     Status: None   Collection Time    05/12/14  6:33 PM      Result Value Ref Range   WBC 6.0  4.0 - 10.5 K/uL   RBC 4.93  4.22 - 5.81 MIL/uL   Hemoglobin 16.7  13.0 - 17.0 g/dL   HCT 47.9  39.0 - 52.0 %   MCV 97.2  78.0 - 100.0 fL   MCH 33.9  26.0 - 34.0 pg   MCHC 34.9  30.0 - 36.0 g/dL   RDW 13.8  11.5 - 15.5 %   Platelets 286  150 - 400 K/uL  COMPREHENSIVE METABOLIC PANEL     Status: Abnormal   Collection Time    05/12/14  6:33 PM      Result Value Ref Range   Sodium 137  137 - 147 mEq/L   Potassium 4.3  3.7 - 5.3 mEq/L   Chloride 87 (*) 96 - 112 mEq/L   CO2 20  19 - 32 mEq/L   Glucose, Bld 78  70 - 99 mg/dL   BUN 11  6 - 23 mg/dL   Creatinine, Ser 0.70  0.50 - 1.35 mg/dL   Calcium 10.1  8.4 - 10.5 mg/dL   Total Protein 8.5 (*) 6.0 - 8.3 g/dL   Albumin 5.0  3.5 - 5.2 g/dL   AST 166 (*) 0 - 37 U/L   ALT 266 (*) 0 - 53 U/L   Alkaline Phosphatase 121 (*) 39 - 117 U/L   Total Bilirubin 1.0  0.3 - 1.2 mg/dL   GFR calc non Af Amer >90  >90 mL/min   GFR calc Af Amer >90  >90 mL/min   Comment: (NOTE)     The eGFR has been calculated using the CKD EPI equation.      This calculation has not been validated in all clinical situations.     eGFR's persistently <90 mL/min signify possible Chronic Kidney     Disease.   Anion gap 30 (*) 5 - 15  ETHANOL     Status: Abnormal   Collection Time    05/12/14  6:33 PM      Result Value Ref Range   Alcohol, Ethyl (B) 104 (*) 0 - 11 mg/dL   Comment:            LOWEST DETECTABLE LIMIT FOR     SERUM ALCOHOL IS 11 mg/dL     FOR MEDICAL PURPOSES ONLY  URINE RAPID DRUG SCREEN (HOSP PERFORMED)     Status: Abnormal   Collection Time    05/12/14  6:37 PM      Result Value Ref Range   Opiates  NONE DETECTED  NONE DETECTED   Cocaine NONE DETECTED  NONE DETECTED   Benzodiazepines NONE DETECTED  NONE DETECTED   Amphetamines NONE DETECTED  NONE DETECTED   Tetrahydrocannabinol POSITIVE (*) NONE DETECTED   Barbiturates NONE DETECTED  NONE DETECTED   Comment:            DRUG SCREEN FOR MEDICAL PURPOSES     ONLY.  IF CONFIRMATION IS NEEDED     FOR ANY PURPOSE, NOTIFY LAB     WITHIN 5 DAYS.                LOWEST DETECTABLE LIMITS     FOR URINE DRUG SCREEN     Drug Class       Cutoff (ng/mL)     Amphetamine      1000     Barbiturate      200     Benzodiazepine   397     Tricyclics       673     Opiates          300     Cocaine          300     THC              50   Labs are reviewed and are pertinent for blood alcohol level 104 UDS is positive for marijuana.  Current Facility-Administered Medications  Medication Dose Route Frequency Provider Last Rate Last Dose  . carbamazepine (TEGRETOL) tablet 200 mg  200 mg Oral QHS Kathlee Nations, MD   200 mg at 05/13/14 2122  . chlordiazePOXIDE (LIBRIUM) capsule 25 mg  25 mg Oral Q6H PRN Kathlee Nations, MD   25 mg at 05/13/14 2129  . chlordiazePOXIDE (LIBRIUM) capsule 25 mg  25 mg Oral TID Kathlee Nations, MD       Followed by  . [START ON 05/15/2014] chlordiazePOXIDE (LIBRIUM) capsule 25 mg  25 mg Oral BH-qamhs Kathlee Nations, MD       Followed by  . [START ON 05/16/2014]  chlordiazePOXIDE (LIBRIUM) capsule 25 mg  25 mg Oral Daily Kathlee Nations, MD      . hydrOXYzine (ATARAX/VISTARIL) tablet 25 mg  25 mg Oral Q6H PRN Kathlee Nations, MD   25 mg at 05/13/14 2122  . loperamide (IMODIUM) capsule 2-4 mg  2-4 mg Oral PRN Kathlee Nations, MD   4 mg at 05/13/14 2130  . multivitamin with minerals tablet 1 tablet  1 tablet Oral Daily Kathlee Nations, MD   1 tablet at 05/14/14 1036  . ondansetron (ZOFRAN-ODT) disintegrating tablet 4 mg  4 mg Oral Q6H PRN Kathlee Nations, MD   4 mg at 05/13/14 1300  . QUEtiapine (SEROQUEL) tablet 100 mg  100 mg Oral BID Kathlee Nations, MD   100 mg at 05/14/14 1036  . sertraline (ZOLOFT) tablet 50 mg  50 mg Oral Daily Kathlee Nations, MD   50 mg at 05/14/14 1036  . thiamine (VITAMIN B-1) tablet 100 mg  100 mg Oral Daily Kathlee Nations, MD   100 mg at 05/14/14 1036   No current outpatient prescriptions on file.    Psychiatric Specialty Exam:     Blood pressure 127/76, pulse 111, temperature 97.9 F (36.6 C), temperature source Oral, resp. rate 16, SpO2 98.00%.There is no weight on file to calculate BMI.  General Appearance: Disheveled, Fairly Groomed and Guarded  Engineer, water::  Fair  Speech:  Slow  Volume:  Decreased  Mood:  Depressed, Dysphoric and Hopeless  Affect:  Constricted and Depressed  Thought Process:  Circumstantial and Goal Directed  Orientation:  Full (Time, Place, and Person)  Thought Content:  Hallucinations: Auditory, Paranoid Ideation and Rumination  Suicidal Thoughts:  Yes.  without intent/plan  Homicidal Thoughts:  No  Memory:  Immediate;   Fair Recent;   Fair Remote;   Fair  Judgement:  Impaired  Insight:  Lacking  Psychomotor Activity:  Decreased, Restlessness and Tremor  Concentration:  Fair  Recall:  [place]: Fair  Akathisia:  No  Handed:  Right  AIMS (if indicated):     Assets:  Desire for Improvement Social Support  Sleep:      Musculoskeletal: Strength & Muscle Tone: within  normal limits Gait & Station: normal Patient leans: N/A  Treatment Plan Summary: Daily contact with patient to assess and evaluate symptoms and progress in treatment Medication management, Start librium protocol, Seroquel, tegretol and Zoloft. Monitor progress and stabilization.   Will continue with current treatment plan for inpatient treatment.  Patient has been accepted to [facility] for inpatient treatment By Dr. Sinclair Grooms, [place], FNP-BC 05/14/2014 1:58 PM  I have personally seen the patient and agreed with the findings and involved in the treatment plan. Berniece Andreas, MD

## 2014-05-14 NOTE — Progress Notes (Signed)
3202- received phone call from [name] at [facility] requesting information on status of pt and d/c'ing to their hospital.  Explained that it was documented that pt had been accepted to their facility.  HH states that bed is still available for pt but pt must be IVC'd prior to pts arrival to their facility.   Rick Duff Disposition MHT

## 2014-05-14 NOTE — Discharge Instructions (Signed)
Alcohol and Nutrition Nutrition serves two purposes. It provides energy. It also maintains body structure and function. Food supplies energy. It also provides the building blocks needed to replace worn or damaged cells. Alcoholics often eat poorly. This limits their supply of essential nutrients. This affects energy supply and structure maintenance. Alcohol also affects the body's nutrients in:  Digestion.  Storage.  Using and getting rid of waste products. IMPAIRMENT OF NUTRIENT DIGESTION AND UTILIZATION   Once ingested, food must be broken down into small components (digested). Then it is available for energy. It helps maintain body structure and function. Digestion begins in the mouth. It continues in the stomach and intestines, with help from the pancreas. The nutrients from digested food are absorbed from the intestines into the blood. Then they are carried to the liver. The liver prepares nutrients for:  Immediate use.  Storage and future use.  Alcohol inhibits the breakdown of nutrients into usable molecules.  It decreases secretion of digestive enzymes from the pancreas.  Alcohol impairs nutrient absorption by damaging the cells lining the stomach and intestines.  It also interferes with moving some nutrients into the blood.  In addition, nutritional deficiencies themselves may lead to further absorption problems.  For example, folate deficiency changes the cells that line the small intestine. This impairs how water is absorbed. It also affects absorbed nutrients. These include glucose, sodium, and additional folate.  Even if nutrients are digested and absorbed, alcohol can prevent them from being fully used. It changes their transport, storage, and excretion. Impaired utilization of nutrients by alcoholics is indicated by:  Decreased liver stores of vitamins, such as vitamin A.  Increased excretion of nutrients such as fat. ALCOHOL AND ENERGY SUPPLY   Three basic  nutritional components found in food are:  Carbohydrates.  Proteins.  Fats.  These are used as energy. Some alcoholics take in as much as 50% of their total daily calories from alcohol. They often neglect important foods.  Even when enough food is eaten, alcohol can impair the ways the body controls blood sugar (glucose) levels. It may either increase or decrease blood sugar.  In non-diabetic alcoholics, increased blood sugar (hyperglycemia) is caused by poor insulin secretion. It is usually temporary.  Decreased blood sugar (hypoglycemia) can cause serious injury even if this condition is short-lived. Low blood sugar can happen when a fasting or malnourished person drinks alcohol. When there is no food to supply energy, stored sugar is used up. The products of alcohol inhibit forming glucose from other compounds such as amino acids. As a result, alcohol causes the brain and other body tissue to lack glucose. It is needed for energy and function.  Alcohol is an energy source. But how the body processes and uses the energy from alcohol is complex. Also, when alcohol is substituted for carbohydrates, subjects tend to lose weight. This indicates that they get less energy from alcohol than from food. ALCOHOL - MAINTAINING CELL STRUCTURE AND FUNCTION  Structure Cells are made mostly of protein. So an adequate protein diet is important for maintaining cell structure. This is especially true if cells are being damaged. Research indicates that alcohol affects protein nutrition by causing impaired:  Digestion of proteins to amino acids.  Processing of amino acids by the small intestine and liver.  Synthesis of proteins from amino acids.  Protein secretion by the liver. Function Nutrients are essential for the body to function well. They provide the tools that the body needs to work well:  Proteins.  Vitamins.  Minerals. Alcohol can disrupt body function. It may cause nutrient  deficiencies. And it may interfere with the way nutrients are processed. Vitamins  Vitamins are essential to maintain growth and normal metabolism. They regulate many of the body`s processes. Chronic heavy drinking causes deficiencies in many vitamins. This is caused by eating less. And, in some cases, vitamins may be poorly absorbed. For example, alcohol inhibits fat absorption. It impairs how the vitamins A, E, and D are normally absorbed along with dietary fats. Not enough vitamin A may cause night blindness. Not enough vitamin D may cause softening of the bones.  Some alcoholics lack vitamins A, C, D, E, K, and the B vitamins. These are all involved in wound healing and cell maintenance. In particular, because vitamin K is necessary for blood clotting, lacking that vitamin can cause delayed clotting. The result is excess bleeding. Lacking other vitamins involved in brain function may cause severe neurological damage. Minerals Deficiencies of minerals such as calcium, magnesium, iron, and zinc are common in alcoholics. The alcohol itself does not seem to affect how these minerals are absorbed. Rather, they seem to occur secondary to other alcohol-related problems, such as:  Less calcium absorbed.  Not enough magnesium.  More urinary excretion.  Vomiting.  Diarrhea.  Not enough iron due to gastrointestinal bleeding.  Not enough zinc or losses related to other nutrient deficiencies.  Mineral deficiencies can cause a variety of medical consequences. These range from calcium-related bone disease to zinc-related night blindness and skin lesions. ALCOHOL, MALNUTRITION, AND MEDICAL COMPLICATIONS  Liver Disease   Alcoholic liver damage is caused primarily by alcohol itself. But poor nutrition may increase the risk of alcohol-related liver damage. For example, nutrients normally found in the liver are known to be affected by drinking alcohol. These include carotenoids, which are the major  sources of vitamin A, and vitamin E compounds. Decreases in such nutrients may play some role in alcohol-related liver damage. Pancreatitis  Research suggests that malnutrition may increase the risk of developing alcoholic pancreatitis. Research suggests that a diet lacking in protein may increase alcohol's damaging effect on the pancreas. Brain  Nutritional deficiencies may have severe effects on brain function. These may be permanent. Specifically, thiamine deficiencies are often seen in alcoholics. They can cause severe neurological problems. These include:  Impaired movement.  Memory loss seen in Wernicke-Korsakoff syndrome. Pregnancy  Alcohol has toxic effects on fetal development. It causes alcohol-related birth defects. They include fetal alcohol syndrome. Alcohol itself is toxic to the fetus. Also, the nutritional deficiency can affect how the fetus develops. That may compound the risk of developmental damage.  Nutritional needs during pregnancy are 10% to 30% greater than normal. Food intake can increase by as much as 140% to cover the needs of both mother and fetus. An alcoholic mother`s nutritional problems may adversely affect the nutrition of the fetus. And alcohol itself can also restrict nutrition flow to the fetus. NUTRITIONAL STATUS OF ALCOHOLICS  Techniques for assessing nutritional status include:  Taking body measurements to estimate fat reserves. They include:  Weight.  Height.  Mass.  Skin fold thickness.  Performing blood analysis to provide measurements of circulating:  Proteins.  Vitamins.  Minerals.  These techniques tend to be imprecise. For many nutrients, there is no clear "cut-off" point that would allow an accurate definition of deficiency. So assessing the nutritional status of alcoholics is limited by these techniques. Dietary status may provide information about the risk of developing nutritional problems.  Dietary status is assessed by:  Taking  patients' dietary histories.  Evaluating the amount and types of food they are eating.  It is difficult to determine what exact amount of alcohol begins to have damaging effects on nutrition. In general, moderate drinkers have 2 drinks or less per day. They seem to be at little risk for nutritional problems. Various medical disorders begin to appear at greater levels.  Research indicates that the majority of even the heaviest drinkers have few obvious nutritional deficiencies. Many alcoholics who are hospitalized for medical complications of their disease do have severe malnutrition. Alcoholics tend to eat poorly. Often they eat less than the amounts of food necessary to provide enough:  Carbohydrates.  Protein.  Fat.  Vitamins A and C.  B vitamins.  Minerals like calcium and iron. Of major concern is alcohol's effect on digesting food and use of nutrients. It may shift a mildly malnourished person toward severe malnutrition. Document Released: 05/29/2005 Document Revised: 10/27/2011 Document Reviewed: 11/12/2005 [facility] Patient Information 2015 [place], [place]. This information is not intended to replace advice given to you by your health care provider. Make sure you discuss any questions you have with your health care provider.  Alcohol Withdrawal Alcohol withdrawal happens when you normally drink alcohol a lot and suddenly stop drinking. Alcohol withdrawal symptoms can be mild to very bad. Mild withdrawal symptoms can include feeling sick to your stomach (nauseous), headache, or feeling irritable. Bad withdrawal symptoms can include shakiness, being very nervous (anxious), and not thinking clearly.  HOME CARE  Join an alcohol support group.  Stay away from people or situations that make you want to drink.  Eat a healthy diet. Eat a lot of fresh fruits, vegetables, and lean meats. GET HELP RIGHT AWAY IF:   You become confused. You start to see and hear things that are not really  there.  You feel your heart beating very fast.  You throw up (vomit) blood or cannot stop throwing up. This may be bright red or look like black coffee grounds.  You have blood in your poop (stool). This may be bright red, maroon colored, or black and tarry.  You are lightheaded or pass out (faint).  You develop a fever. MAKE SURE YOU:   Understand these instructions.  Will watch your condition.  Will get help right away if you are not doing well or get worse. Document Released: 01/21/2008 Document Revised: 10/27/2011 Document Reviewed: 01/21/2008 [facility] Patient Information 2015 [place], [place]. This information is not intended to replace advice given to you by your health care provider. Make sure you discuss any questions you have with your health care provider.  Alcohol Use Disorder Alcohol use disorder is a mental disorder. It is not a one-time incident of heavy drinking. Alcohol use disorder is the excessive and uncontrollable use of alcohol over time that leads to problems with functioning in one or more areas of daily living. People with this disorder risk harming themselves and others when they drink to excess. Alcohol use disorder also can cause other mental disorders, such as mood and anxiety disorders, and serious physical problems. People with alcohol use disorder often misuse other drugs.  Alcohol use disorder is common and widespread. Some people with this disorder drink alcohol to cope with or escape from negative life events. Others drink to relieve chronic pain or symptoms of mental illness. People with a family history of alcohol use disorder are at higher risk of losing control and using alcohol to excess.  SYMPTOMS  Signs and symptoms of alcohol use disorder may include the following:   Consumption ofalcohol inlarger amounts or over a longer period of time than intended.  Multiple unsuccessful attempts to cutdown or control alcohol use.   A great deal of time  spent obtaining alcohol, using alcohol, or recovering from the effects of alcohol (hangover).  A strong desire or urge to use alcohol (cravings).   Continued use of alcohol despite problems at work, school, or home because of alcohol use.   Continued use of alcohol despite problems in relationships because of alcohol use.  Continued use of alcohol in situations when it is physically hazardous, such as driving a car.  Continued use of alcohol despite awareness of a physical or psychological problem that is likely related to alcohol use. Physical problems related to alcohol use can involve the brain, heart, liver, stomach, and intestines. Psychological problems related to alcohol use include intoxication, depression, anxiety, psychosis, delirium, and dementia.   The need for increased amounts of alcohol to achieve the same desired effect, or a decreased effect from the consumption of the same amount of alcohol (tolerance).  Withdrawal symptoms upon reducing or stopping alcohol use, or alcohol use to reduce or avoid withdrawal symptoms. Withdrawal symptoms include:  Racing heart.  Hand tremor.  Difficulty sleeping.  Nausea.  Vomiting.  Hallucinations.  Restlessness.  Seizures. DIAGNOSIS Alcohol use disorder is diagnosed through an assessment by your health care provider. Your health care provider may start by asking three or four questions to screen for excessive or problematic alcohol use. To confirm a diagnosis of alcohol use disorder, at least two symptoms must be present within a 51-month period. The severity of alcohol use disorder depends on the number of symptoms:  Mild--two or three.  Moderate--four or five.  Severe--six or more. Your health care provider may perform a physical exam or use results from lab tests to see if you have physical problems resulting from alcohol use. Your health care provider may refer you to a mental health professional for  evaluation. TREATMENT  Some people with alcohol use disorder are able to reduce their alcohol use to low-risk levels. Some people with alcohol use disorder need to quit drinking alcohol. When necessary, mental health professionals with specialized training in substance use treatment can help. Your health care provider can help you decide how severe your alcohol use disorder is and what type of treatment you need. The following forms of treatment are available:   Detoxification. Detoxification involves the use of prescription medicines to prevent alcohol withdrawal symptoms in the first week after quitting. This is important for people with a history of symptoms of withdrawal and for heavy drinkers who are likely to have withdrawal symptoms. Alcohol withdrawal can be dangerous and, in severe cases, cause death. Detoxification is usually provided in a hospital or in-patient substance use treatment facility.  Counseling or talk therapy. Talk therapy is provided by substance use treatment counselors. It addresses the reasons people use alcohol and ways to keep them from drinking again. The goals of talk therapy are to help people with alcohol use disorder find healthy activities and ways to cope with life stress, to identify and avoid triggers for alcohol use, and to handle cravings, which can cause relapse.  Medicines.Different medicines can help treat alcohol use disorder through the following actions:  Decrease alcohol cravings.  Decrease the positive reward response felt from alcohol use.  Produce an uncomfortable physical reaction when alcohol is used (aversion therapy).  Support groups. Support groups are run by people who have quit drinking. They provide emotional support, advice, and guidance. These forms of treatment are often combined. Some people with alcohol use disorder benefit from intensive combination treatment provided by specialized substance use treatment centers. Both inpatient and  outpatient treatment programs are available. Document Released: 09/11/2004 Document Revised: 12/19/2013 Document Reviewed: 11/11/2012 [facility] Patient Information 2015 [place], [place]. This information is not intended to replace advice given to you by your health care provider. Make sure you discuss any questions you have with your health care provider.  Drug Abuse and Addiction in Sports There are many types of drugs that one may become addicted to including illegal drugs (marijuana, cocaine, amphetamines, hallucinogens, and narcotics), prescription drugs (hydrocodone, codeine, and alprazolam), and other chemicals such as alcohol or nicotine. Two types of addiction exist: physical and emotional. Physical addiction usually occurs after prolonged use of a drug. However, some drugs may only take a couple uses before addiction can occur. Physical addiction is marked by withdrawal symptoms in which the person experiences negative symptoms such as sweat, anxiety, tremors, hallucinations, or cravings in the absence of using the drug. Emotional dependence is the psychological desire for the "high" that the drugs produce when taken. SYMPTOMS   Inattentiveness.  Negligence.  Forgetfulness.  Insomnia.  Mood swings. RISK INCREASES WITH:   Family history of addiction.  Personal history of addictive personality. Studies have shown that risk takers, which many athletes are, have a higher risk of addiction. PREVENTION The only adequate prevention of drug abuse is abstinence from drugs. TREATMENT  The first step in quitting substance abuse is recognizing the problem and realizing that one has the power to change. Quitting requires a plan and support from others. It is often necessary to seek medical assistance. Caregivers are available to offer counseling, and for certain cases, medicine to diminish the physical symptoms of withdrawal. Many organizations exist such as Alcoholics Anonymous, Narcotics  Anonymous, or the [employer] on Alcoholism that offer support for individuals who have chosen to quit their habits. Document Released: 08/04/2005 Document Revised: 12/19/2013 Document Reviewed: 11/16/2008 [facility] Patient Information 2015 [place], [place]. This information is not intended to replace advice given to you by your health care provider. Make sure you discuss any questions you have with your health care provider.

## 2014-05-14 NOTE — ED Notes (Signed)
Pt resting at present, remains SI, will continue to monitor for safety.

## 2014-05-15 DIAGNOSIS — F259 Schizoaffective disorder, unspecified: Secondary | ICD-10-CM | POA: Diagnosis not present

## 2014-05-15 NOTE — ED Notes (Signed)
[employer]iffs transportation requested.

## 2014-05-16 DIAGNOSIS — F259 Schizoaffective disorder, unspecified: Secondary | ICD-10-CM | POA: Diagnosis not present
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
## 2014-05-17 DIAGNOSIS — F259 Schizoaffective disorder, unspecified: Secondary | ICD-10-CM | POA: Diagnosis not present

## 2014-05-18 DIAGNOSIS — F250 Schizoaffective disorder, bipolar type: Secondary | ICD-10-CM | POA: Diagnosis not present

## 2014-05-19 DIAGNOSIS — F250 Schizoaffective disorder, bipolar type: Secondary | ICD-10-CM | POA: Diagnosis not present

## 2014-05-20 DIAGNOSIS — F250 Schizoaffective disorder, bipolar type: Secondary | ICD-10-CM | POA: Diagnosis not present

## 2014-05-22 DIAGNOSIS — F250 Schizoaffective disorder, bipolar type: Secondary | ICD-10-CM | POA: Diagnosis not present

## 2014-05-23 DIAGNOSIS — F250 Schizoaffective disorder, bipolar type: Secondary | ICD-10-CM | POA: Diagnosis not present

## 2014-07-06 ENCOUNTER — Emergency Department (HOSPITAL_COMMUNITY)
Admission: EM | Admit: 2014-07-06 | Discharge: 2014-07-07 | Disposition: A | Discharge status: Home / Self Care | Payer: Medicare Other | Attending: Emergency Medicine | Admitting: Emergency Medicine

## 2014-07-06 ENCOUNTER — Encounter (HOSPITAL_COMMUNITY): Payer: Self-pay | Admitting: Emergency Medicine

## 2014-07-06 DIAGNOSIS — Z720 Tobacco use: Secondary | ICD-10-CM | POA: Insufficient documentation

## 2014-07-06 DIAGNOSIS — F329 Major depressive disorder, single episode, unspecified: Secondary | ICD-10-CM | POA: Diagnosis not present

## 2014-07-06 DIAGNOSIS — R062 Wheezing: Secondary | ICD-10-CM | POA: Diagnosis not present

## 2014-07-06 DIAGNOSIS — R251 Tremor, unspecified: Secondary | ICD-10-CM | POA: Diagnosis not present

## 2014-07-06 DIAGNOSIS — Z79899 Other long term (current) drug therapy: Secondary | ICD-10-CM | POA: Diagnosis not present

## 2014-07-06 DIAGNOSIS — J029 Acute pharyngitis, unspecified: Secondary | ICD-10-CM | POA: Diagnosis not present

## 2014-07-06 DIAGNOSIS — F419 Anxiety disorder, unspecified: Secondary | ICD-10-CM | POA: Insufficient documentation

## 2014-07-06 DIAGNOSIS — F22 Delusional disorders: Secondary | ICD-10-CM | POA: Diagnosis not present

## 2014-07-06 DIAGNOSIS — R0602 Shortness of breath: Secondary | ICD-10-CM | POA: Diagnosis not present

## 2014-07-06 DIAGNOSIS — F200 Paranoid schizophrenia: Secondary | ICD-10-CM | POA: Diagnosis not present

## 2014-07-06 DIAGNOSIS — F259 Schizoaffective disorder, unspecified: Secondary | ICD-10-CM | POA: Diagnosis not present

## 2014-07-06 DIAGNOSIS — F4310 Post-traumatic stress disorder, unspecified: Secondary | ICD-10-CM | POA: Diagnosis present

## 2014-07-06 DIAGNOSIS — R05 Cough: Secondary | ICD-10-CM | POA: Diagnosis not present

## 2014-07-06 DIAGNOSIS — R443 Hallucinations, unspecified: Secondary | ICD-10-CM | POA: Diagnosis present

## 2014-07-06 DIAGNOSIS — F1210 Cannabis abuse, uncomplicated: Secondary | ICD-10-CM | POA: Diagnosis present

## 2014-07-06 LAB — COMPREHENSIVE METABOLIC PANEL
ALT: 41 U/L (ref 0–53)
AST: 68 U/L — ABNORMAL HIGH (ref 0–37)
Albumin: 4.2 g/dL (ref 3.5–5.2)
Alkaline Phosphatase: 89 U/L (ref 39–117)
Anion gap: 15 (ref 5–15)
BUN: 6 mg/dL (ref 6–23)
CO2: 26 mEq/L (ref 19–32)
Calcium: 10.3 mg/dL (ref 8.4–10.5)
Chloride: 96 mEq/L (ref 96–112)
Creatinine, Ser: 0.79 mg/dL (ref 0.50–1.35)
GFR calc Af Amer: >90 mL/min (ref >90)
GFR calc non Af Amer: >90 mL/min (ref >90)
Glucose, Bld: 103 mg/dL — ABNORMAL HIGH (ref 70–99)
Potassium: 3.9 mEq/L (ref 3.7–5.3)
Sodium: 137 mEq/L (ref 137–147)
Total Bilirubin: 0.4 mg/dL (ref 0.3–1.2)
Total Protein: 7.4 g/dL (ref 6.0–8.3)

## 2014-07-06 LAB — CBC WITH DIFFERENTIAL/PLATELET
Basophils Absolute: 0 10*3/uL (ref 0.0–0.1)
Basophils Relative: 0 % (ref 0–1)
Eosinophils Absolute: 0 10*3/uL (ref 0.0–0.7)
Eosinophils Relative: 0 % (ref 0–5)
HCT: 42.9 % (ref 39.0–52.0)
Hemoglobin: 14.7 g/dL (ref 13.0–17.0)
Lymphocytes Relative: 17 % (ref 12–46)
Lymphs Abs: 1.1 10*3/uL (ref 0.7–4.0)
MCH: 33.6 pg (ref 26.0–34.0)
MCHC: 34.3 g/dL (ref 30.0–36.0)
MCV: 98.2 fL (ref 78.0–100.0)
Monocytes Absolute: 0.7 10*3/uL (ref 0.1–1.0)
Monocytes Relative: 11 % (ref 3–12)
Neutro Abs: 4.6 10*3/uL (ref 1.7–7.7)
Neutrophils Relative %: 72 % (ref 43–77)
Platelets: 142 10*3/uL — ABNORMAL LOW (ref 150–400)
RBC: 4.37 MIL/uL (ref 4.22–5.81)
RDW: 15.5 % (ref 11.5–15.5)
WBC: 6.4 10*3/uL (ref 4.0–10.5)

## 2014-07-06 LAB — RAPID URINE DRUG SCREEN, HOSP PERFORMED
Amphetamines: NOT DETECTED
Barbiturates: NOT DETECTED
Benzodiazepines: NOT DETECTED
Cocaine: NOT DETECTED
Opiates: NOT DETECTED
Tetrahydrocannabinol: POSITIVE — AB

## 2014-07-06 LAB — ETHANOL: Alcohol, Ethyl (B): <11 mg/dL (ref 0–11)

## 2014-07-06 MED ORDER — ZOLPIDEM TARTRATE 5 MG PO TABS: 5.0000 mg | ORAL_TABLET | Freq: Every evening | ORAL | Status: DC | PRN

## 2014-07-06 MED ORDER — ACETAMINOPHEN 325 MG PO TABS: 650.0000 mg | ORAL_TABLET | ORAL | Status: DC | PRN

## 2014-07-06 MED ORDER — ALUM & MAG HYDROXIDE-SIMETH 200-200-20 MG/5ML PO SUSP: 30.0000 mL | ORAL | Status: DC | PRN

## 2014-07-06 MED ORDER — NICOTINE 21 MG/24HR TD PT24
21.0000 mg | MEDICATED_PATCH | Freq: Every day | TRANSDERMAL | Status: DC
  Administered 2014-07-06: 21 mg via TRANSDERMAL
  Filled 2014-07-06: qty 1

## 2014-07-06 MED ORDER — LORAZEPAM 1 MG PO TABS
1.0000 mg | ORAL_TABLET | Freq: Once | ORAL | Status: AC
Start: 2014-07-06 — End: 2014-07-06
  Administered 2014-07-06: 1 mg via ORAL
  Filled 2014-07-06: qty 1

## 2014-07-06 MED ORDER — ALBUTEROL SULFATE HFA 108 (90 BASE) MCG/ACT IN AERS
2.0000 | INHALATION_SPRAY | Freq: Once | RESPIRATORY_TRACT | Status: AC
  Administered 2014-07-06: 2 via RESPIRATORY_TRACT
  Filled 2014-07-06: qty 6.7

## 2014-07-06 MED ORDER — LORAZEPAM 1 MG PO TABS: 1.0000 mg | ORAL_TABLET | Freq: Three times a day (TID) | ORAL | Status: DC | PRN

## 2014-07-06 MED ORDER — IBUPROFEN 200 MG PO TABS: 600.0000 mg | ORAL_TABLET | Freq: Three times a day (TID) | ORAL | Status: DC | PRN

## 2014-07-06 MED ORDER — ONDANSETRON HCL 4 MG PO TABS: 4.0000 mg | ORAL_TABLET | Freq: Three times a day (TID) | ORAL | Status: DC | PRN
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
## 2014-07-06 NOTE — Progress Notes (Signed)
CSW faxed out referrals to the following facilities : [facility], and [facility].   Willette Brace [phone] ED CSW 07/06/2014 8:47 PM

## 2014-07-06 NOTE — ED Notes (Signed)
Per pt, states he is seeing things but they are not hallucinations-states people put stuff in his gums and have been following him-states he has been compliant with meds

## 2014-07-06 NOTE — BH Assessment (Signed)
Assessment completed. CSW spoke with Psychiatrist and discussed the assessment regarding Pt. Psychiatrist also agreed that the suggestion of Inpatient treatment upon discharge was best.   Willette Brace [phone] ED CSW 07/06/2014 7:47 PM
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
## 2014-07-06 NOTE — ED Notes (Signed)
Bed: HQR97 Expected date:  Expected time:  Means of arrival:  Comments: Hold for triage 4
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
## 2014-07-07 ENCOUNTER — Encounter (HOSPITAL_COMMUNITY): Payer: Self-pay | Admitting: Psychiatry

## 2014-07-07 DIAGNOSIS — F4310 Post-traumatic stress disorder, unspecified: Secondary | ICD-10-CM | POA: Diagnosis not present

## 2014-07-07 DIAGNOSIS — F259 Schizoaffective disorder, unspecified: Secondary | ICD-10-CM

## 2014-07-07 DIAGNOSIS — F200 Paranoid schizophrenia: Secondary | ICD-10-CM | POA: Diagnosis not present

## 2014-07-07 DIAGNOSIS — R443 Hallucinations, unspecified: Secondary | ICD-10-CM | POA: Diagnosis present

## 2014-07-07 DIAGNOSIS — F22 Delusional disorders: Secondary | ICD-10-CM | POA: Diagnosis present

## 2014-07-07 MED ORDER — SERTRALINE HCL 50 MG PO TABS: 50.0000 mg | ORAL_TABLET | Freq: Every day | ORAL | Status: DC

## 2014-07-07 MED ORDER — QUETIAPINE FUMARATE 100 MG PO TABS: 100.0000 mg | ORAL_TABLET | Freq: Two times a day (BID) | ORAL | Status: DC

## 2014-07-07 MED ORDER — CARBAMAZEPINE 200 MG PO TABS: 200.0000 mg | ORAL_TABLET | Freq: Every day | ORAL | Status: DC

## 2014-07-07 NOTE — BHH Suicide Risk Assessment (Signed)
Suicide Risk Assessment  Discharge Assessment     Demographic Factors:  Male, Caucasian and Living alone  Total Time spent with patient: 45 minutes   Psychiatric Specialty Exam:     Blood pressure 137/96, pulse 81, temperature 98.1 F (36.7 C), temperature source Oral, resp. rate 16, SpO2 99 %.There is no weight on file to calculate BMI.  General Appearance: Casual  Eye Contact::  Good  Speech:  Normal Rate  Volume:  Normal  Mood:  Anxious  Affect:  Congruent  Thought Process:  Coherent  Orientation:  Full (Time, Place, and Person)  Thought Content:  WDL  Suicidal Thoughts:  No  Homicidal Thoughts:  No  Memory:  Immediate;   Good Recent;   Good Remote;   Good  Judgement:  Fair  Insight:  Fair  Psychomotor Activity:  Normal  Concentration:  Good  Recall:  Good  Fund of Knowledge:Good  Language: Good  Akathisia:  No  Handed:  Right  AIMS (if indicated):     Assets:  Catering manager Housing Leisure Time Physical Health Resilience Transportation  Sleep:      Musculoskeletal: Strength & Muscle Tone: within normal limits Gait & Station: normal Patient leans: N/A  Mental Status Per Nursing Assessment::   On Admission:   Paranoia, visual hallucinations  Current Mental Status by Physician: NA  Loss Factors: NA  Historical Factors: NA  Risk Reduction Factors:   Sense of responsibility to family, Positive social support and Positive coping skills or problem solving skills  Continued Clinical Symptoms:  None  Cognitive Features That Contribute To Risk:  None   Suicide Risk:  Minimal: No identifiable suicidal ideation.  Patients presenting with no risk factors but with morbid ruminations; may be classified as minimal risk based on the severity of the depressive symptoms  Discharge Diagnoses:   AXIS I:  Post Traumatic Stress Disorder, Schizoaffective Disorder and Substance Abuse AXIS II:  Deferred AXIS III:   Past Medical History   Diagnosis Date  . Depression   . Psychosis   . PTSD (post-traumatic stress disorder)   . Seizure disorder     related to etoh seizure  . Alcohol abuse   . Schizoaffective disorder   . Anxiety    AXIS IV:  other psychosocial or environmental problems and problems related to social environment AXIS V:  61-70 mild symptoms  Plan Of Care/Follow-up recommendations:  Activity:  as tolerated Diet:  heart healthy diet  Is patient on multiple antipsychotic therapies at discharge:  No   Has Patient had three or more failed trials of antipsychotic monotherapy by history:  No  Recommended Plan for Multiple Antipsychotic Therapies: NA    [name], [place], PMH-NP 07/07/2014, 2:13 PM
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
## 2014-07-07 NOTE — Consult Note (Signed)
[facility] Face-to-Face Psychiatry Consult   Reason for Consult:  Hallucinations  Referring Physician:  EDP  [name] is an 43 y.o. male. Total Time spent with patient: 45 minutes  Assessment: AXIS I:  Post Traumatic Stress Disorder and Schizoaffective Disorder; paranoia AXIS II:  Deferred AXIS III:   Past Medical History  Diagnosis Date  . Depression   . Psychosis   . PTSD (post-traumatic stress disorder)   . Seizure disorder     related to etoh seizure  . Alcohol abuse   . Schizoaffective disorder   . Anxiety    AXIS IV:  other psychosocial or environmental problems and problems related to social environment AXIS V:  61-70 mild symptoms  Plan:  No evidence of imminent risk to self or others at present.    Subjective:   [name] is a 43 y.o. male patient does not warrant admission.  HPI:  The patient discharged from [facility] in early October but did not follow-up with [facility].  His medications ran out and he started seeing people out of his "peripheral vision" following him.  They were "holding me against my will."  [facility]'s medications were restarted and he has not had these hallucinations since arriving in the ED.  He feels safe to return home and follow-up with [facility].  Denies suicidal/homicidal ideations, hallucinations, and alcohol abuse.  Positive for marijuana use contributing to his paranoia, advised against use.   HPI Elements:   Location:  generalized. Quality:  acute. Severity:  mild. Timing:  intermittent. Duration:  brief. Context:  stressors.  Past Psychiatric History: Past Medical History  Diagnosis Date  . Depression   . Psychosis   . PTSD (post-traumatic stress disorder)   . Seizure disorder     related to etoh seizure  . Alcohol abuse   . Schizoaffective disorder   . Anxiety     reports that he has been smoking Cigarettes.  He has a 36 pack-year smoking history. He has never used smokeless tobacco. He reports that he drinks about 50.4 oz of  alcohol per week. He reports that he uses illicit drugs (Marijuana) about once per week. History reviewed. No pertinent family history.   Living Arrangements: Alone Can pt return to current living arrangement?: Yes   Allergies:  No Known Allergies  ACT Assessment Complete:  Yes:    Educational Status    Risk to Self: Risk to self with the past 6 months Suicidal Ideation: No-Not Currently/Within Last 6 Months (Pt says he has not had SI in a little over 3 months.) Suicidal Intent: No Is patient at risk for suicide?: No Suicidal Plan?: No Access to Means: No What has been your use of drugs/alcohol within the last 12 months?: Marijuana Previous Attempts/Gestures: Yes How many times?:  (5 years go pt had plan to OD.) Family Suicide History: Unknown Persecutory voices/beliefs?: Yes Depression: No Substance abuse history and/or treatment for substance abuse?: Yes ((+ thc))  Risk to Others: Risk to Others within the past 6 months Homicidal Ideation: No Thoughts of Harm to Others: No Current Homicidal Intent: No Current Homicidal Plan: No Access to Homicidal Means: No History of harm to others?: No  Abuse:    Prior Inpatient Therapy: Prior Inpatient Therapy Prior Inpatient Therapy: Yes Prior Therapy Dates:  (Pt missed appt at [facility] 10/7.)  Prior Outpatient Therapy: Prior Outpatient Therapy Prior Outpatient Therapy: Yes Prior Therapy Facilty/Provider(s): [facility]  Additional Information: Additional Information 1:1 In Past 12 Months?: No CIRT Risk: No Elopement Risk: No  Does patient have medical clearance?: Yes                  Objective: Blood pressure 137/96, pulse 81, temperature 98.1 F (36.7 C), temperature source Oral, resp. rate 16, SpO2 99 %.There is no weight on file to calculate BMI. Results for orders placed or performed during the hospital encounter of 07/06/14 (from the past 72 hour(s))  Comprehensive metabolic panel     Status: Abnormal   Collection  Time: 07/06/14  5:41 PM  Result Value Ref Range   Sodium 137 137 - 147 mEq/L   Potassium 3.9 3.7 - 5.3 mEq/L   Chloride 96 96 - 112 mEq/L   CO2 26 19 - 32 mEq/L   Glucose, Bld 103 (H) 70 - 99 mg/dL   BUN 6 6 - 23 mg/dL   Creatinine, Ser 0.79 0.50 - 1.35 mg/dL   Calcium 10.3 8.4 - 10.5 mg/dL   Total Protein 7.4 6.0 - 8.3 g/dL   Albumin 4.2 3.5 - 5.2 g/dL   AST 68 (H) 0 - 37 U/L   ALT 41 0 - 53 U/L   Alkaline Phosphatase 89 39 - 117 U/L   Total Bilirubin 0.4 0.3 - 1.2 mg/dL   GFR calc non Af Amer >90 >90 mL/min   GFR calc Af Amer >90 >90 mL/min    Comment: (NOTE) The eGFR has been calculated using the CKD EPI equation. This calculation has not been validated in all clinical situations. eGFR's persistently <90 mL/min signify possible Chronic Kidney Disease.    Anion gap 15 5 - 15  CBC with Differential     Status: Abnormal   Collection Time: 07/06/14  5:41 PM  Result Value Ref Range   WBC 6.4 4.0 - 10.5 K/uL   RBC 4.37 4.22 - 5.81 MIL/uL   Hemoglobin 14.7 13.0 - 17.0 g/dL   HCT 42.9 39.0 - 52.0 %   MCV 98.2 78.0 - 100.0 fL   MCH 33.6 26.0 - 34.0 pg   MCHC 34.3 30.0 - 36.0 g/dL   RDW 15.5 11.5 - 15.5 %   Platelets 142 (L) 150 - 400 K/uL   Neutrophils Relative % 72 43 - 77 %   Neutro Abs 4.6 1.7 - 7.7 K/uL   Lymphocytes Relative 17 12 - 46 %   Lymphs Abs 1.1 0.7 - 4.0 K/uL   Monocytes Relative 11 3 - 12 %   Monocytes Absolute 0.7 0.1 - 1.0 K/uL   Eosinophils Relative 0 0 - 5 %   Eosinophils Absolute 0.0 0.0 - 0.7 K/uL   Basophils Relative 0 0 - 1 %   Basophils Absolute 0.0 0.0 - 0.1 K/uL  Ethanol     Status: None   Collection Time: 07/06/14  5:41 PM  Result Value Ref Range   Alcohol, Ethyl (B) <11 0 - 11 mg/dL    Comment:        LOWEST DETECTABLE LIMIT FOR SERUM ALCOHOL IS 11 mg/dL FOR MEDICAL PURPOSES ONLY   Drug screen panel, emergency     Status: Abnormal   Collection Time: 07/06/14  5:55 PM  Result Value Ref Range   Opiates NONE DETECTED NONE DETECTED    Cocaine NONE DETECTED NONE DETECTED   Benzodiazepines NONE DETECTED NONE DETECTED   Amphetamines NONE DETECTED NONE DETECTED   Tetrahydrocannabinol POSITIVE (A) NONE DETECTED   Barbiturates NONE DETECTED NONE DETECTED    Comment:        DRUG SCREEN FOR MEDICAL PURPOSES ONLY.  IF CONFIRMATION IS NEEDED FOR ANY PURPOSE, NOTIFY LAB WITHIN 5 DAYS.        LOWEST DETECTABLE LIMITS FOR URINE DRUG SCREEN Drug Class       Cutoff (ng/mL) Amphetamine      1000 Barbiturate      200 Benzodiazepine   161 Tricyclics       096 Opiates          300 Cocaine          300 THC              50    Labs are reviewed and are pertinent for no medical issues.  Current Facility-Administered Medications  Medication Dose Route Frequency Provider Last Rate Last Dose  . acetaminophen (TYLENOL) tablet 650 mg  650 mg Oral Q4H PRN Clayton Bibles, PA-C      . alum & mag hydroxide-simeth (MAALOX/MYLANTA) 200-200-20 MG/5ML suspension 30 mL  30 mL Oral PRN Clayton Bibles, PA-C      . ibuprofen (ADVIL,MOTRIN) tablet 600 mg  600 mg Oral Q8H PRN Clayton Bibles, PA-C      . LORazepam (ATIVAN) tablet 1 mg  1 mg Oral Q8H PRN Clayton Bibles, PA-C      . nicotine (NICODERM CQ - dosed in mg/24 hours) patch 21 mg  21 mg Transdermal Daily Emily West, PA-C   21 mg at 07/06/14 1809  . ondansetron (ZOFRAN) tablet 4 mg  4 mg Oral Q8H PRN Clayton Bibles, PA-C      . zolpidem (AMBIEN) tablet 5 mg  5 mg Oral QHS PRN Clayton Bibles, PA-C       Current Outpatient Prescriptions  Medication Sig Dispense Refill  . carbamazepine (TEGRETOL) 200 MG tablet Take 1 tablet (200 mg total) by mouth at bedtime. 1 tablet 0  . QUEtiapine (SEROQUEL) 200 MG tablet Take 200-600 mg by mouth 2 (two) times daily. Takes 247m in the morning and 6032mat bedtime    . sertraline (ZOLOFT) 50 MG tablet Take 1 tablet (50 mg total) by mouth daily. (Patient taking differently: Take 50 mg by mouth daily with breakfast. ) 1 tablet 0  . QUEtiapine (SEROQUEL) 100 MG tablet Take 1 tablet  (100 mg total) by mouth 2 (two) times daily. (Patient not taking: Reported on 07/07/2014) 1 tablet 0    Psychiatric Specialty Exam:     Blood pressure 137/96, pulse 81, temperature 98.1 F (36.7 C), temperature source Oral, resp. rate 16, SpO2 99 %.There is no weight on file to calculate BMI.  General Appearance: Casual  Eye Contact::  Good  Speech:  Normal Rate  Volume:  Normal  Mood:  Anxious  Affect:  Congruent  Thought Process:  Coherent  Orientation:  Full (Time, Place, and Person)  Thought Content:  WDL  Suicidal Thoughts:  No  Homicidal Thoughts:  No  Memory:  Immediate;   Good Recent;   Good Remote;   Good  Judgement:  Fair  Insight:  Fair  Psychomotor Activity:  Normal  Concentration:  Good  Recall:  Good  Fund of Knowledge:Good  Language: Good  Akathisia:  No  Handed:  Right  AIMS (if indicated):     Assets:  Financial Resources/Insurance Housing Leisure Time Physical Health Resilience Transportation  Sleep:      Musculoskeletal: Strength & Muscle Tone: within normal limits Gait & Station: normal Patient leans: N/A  Treatment Plan Summary: [facility].  LOWaylan BogaPMBecker1/20/2015 12:25 PM  Patient seen,  evaluated and I agree with notes by Nurse Practitioner. Corena Pilgrim, MD

## 2014-07-07 NOTE — Progress Notes (Signed)
CSW received call pt beiung reviewed at [employer].   Noreene Larsson [phone]  ED CSW 07/07/2014 7:45 AM

## 2014-07-07 NOTE — ED Notes (Signed)
[name] was calm and cooperative at admission. He denied SI, HI, and a/v disturbances. He was given a copy of AVS, his belongings, a bus pass and prescriptions to take to [facility]. Pt escorted from unit by staff member.

## 2014-07-07 NOTE — Progress Notes (Signed)
  CARE MANAGEMENT ED NOTE 07/07/2014  Patient:  [name], [name]   Account Number:  [id number]  Date Initiated:  07/07/2014  Documentation initiated by:  Jackelyn Poling  Subjective/Objective Assessment:   60 y r old medicare pt [place] pt states he is seeing things but they are not hallucinations-states people put stuff in his gums and have been following him-states he has been compliant with meds                      Subjective/Objective Assessment Detail:   Positive for marijuana per UDS  No pcp listed confirmed no pcp  appreciative of list of medicare providers (6 pages)     Action/Plan:   CM noted no pcp for pt Cm spoke with pt provided list of medicare providers within zip [phone] to pt   Action/Plan Detail:   Anticipated DC Date:  07/07/2014     Status Recommendation to Physician:   Result of Recommendation:    Other ED [place]  Other  Outpatient Services - Pt will follow up  PCP issues    Choice offered to / List presented to:            Status of service:  Completed, signed off  ED Comments:   ED Comments Detail:

## 2014-09-11 ENCOUNTER — Emergency Department (HOSPITAL_COMMUNITY)
Admission: EM | Admit: 2014-09-11 | Discharge: 2014-09-12 | Disposition: A | Discharge status: Home / Self Care | Payer: Medicare Other | Attending: Emergency Medicine | Admitting: Emergency Medicine

## 2014-09-11 ENCOUNTER — Encounter (HOSPITAL_COMMUNITY): Payer: Self-pay

## 2014-09-11 DIAGNOSIS — F4310 Post-traumatic stress disorder, unspecified: Secondary | ICD-10-CM | POA: Diagnosis not present

## 2014-09-11 DIAGNOSIS — R441 Visual hallucinations: Secondary | ICD-10-CM

## 2014-09-11 DIAGNOSIS — F329 Major depressive disorder, single episode, unspecified: Secondary | ICD-10-CM | POA: Insufficient documentation

## 2014-09-11 DIAGNOSIS — R000 Tachycardia, unspecified: Secondary | ICD-10-CM | POA: Insufficient documentation

## 2014-09-11 DIAGNOSIS — G40909 Epilepsy, unspecified, not intractable, without status epilepticus: Secondary | ICD-10-CM | POA: Insufficient documentation

## 2014-09-11 DIAGNOSIS — F259 Schizoaffective disorder, unspecified: Secondary | ICD-10-CM | POA: Insufficient documentation

## 2014-09-11 DIAGNOSIS — F419 Anxiety disorder, unspecified: Secondary | ICD-10-CM | POA: Diagnosis not present

## 2014-09-11 DIAGNOSIS — Z008 Encounter for other general examination: Secondary | ICD-10-CM | POA: Diagnosis present

## 2014-09-11 DIAGNOSIS — R6889 Other general symptoms and signs: Secondary | ICD-10-CM | POA: Diagnosis not present

## 2014-09-11 DIAGNOSIS — Z720 Tobacco use: Secondary | ICD-10-CM | POA: Insufficient documentation

## 2014-09-11 DIAGNOSIS — F251 Schizoaffective disorder, depressive type: Secondary | ICD-10-CM | POA: Diagnosis not present

## 2014-09-11 DIAGNOSIS — F1020 Alcohol dependence, uncomplicated: Secondary | ICD-10-CM | POA: Insufficient documentation

## 2014-09-11 DIAGNOSIS — Z9114 Patient's other noncompliance with medication regimen: Secondary | ICD-10-CM | POA: Diagnosis not present

## 2014-09-11 DIAGNOSIS — F1210 Cannabis abuse, uncomplicated: Secondary | ICD-10-CM | POA: Diagnosis not present

## 2014-09-11 DIAGNOSIS — F1010 Alcohol abuse, uncomplicated: Secondary | ICD-10-CM | POA: Diagnosis present

## 2014-09-11 DIAGNOSIS — Z79899 Other long term (current) drug therapy: Secondary | ICD-10-CM | POA: Diagnosis not present

## 2014-09-11 LAB — CBC WITH DIFFERENTIAL/PLATELET
Basophils Absolute: 0.1 10*3/uL (ref 0.0–0.1)
Basophils Relative: 1 % (ref 0–1)
Eosinophils Absolute: 0 10*3/uL (ref 0.0–0.7)
Eosinophils Relative: 0 % (ref 0–5)
HCT: 46.2 % (ref 39.0–52.0)
Hemoglobin: 16.1 g/dL (ref 13.0–17.0)
Lymphocytes Relative: 23 % (ref 12–46)
Lymphs Abs: 1.8 10*3/uL (ref 0.7–4.0)
MCH: 33.3 pg (ref 26.0–34.0)
MCHC: 34.8 g/dL (ref 30.0–36.0)
MCV: 95.5 fL (ref 78.0–100.0)
Monocytes Absolute: 0.8 10*3/uL (ref 0.1–1.0)
Monocytes Relative: 10 % (ref 3–12)
Neutro Abs: 5.1 10*3/uL (ref 1.7–7.7)
Neutrophils Relative %: 66 % (ref 43–77)
Platelets: 269 10*3/uL (ref 150–400)
RBC: 4.84 MIL/uL (ref 4.22–5.81)
RDW: 14 % (ref 11.5–15.5)
WBC: 7.8 10*3/uL (ref 4.0–10.5)

## 2014-09-11 LAB — COMPREHENSIVE METABOLIC PANEL
ALT: 43 U/L (ref 0–53)
AST: 49 U/L — ABNORMAL HIGH (ref 0–37)
Albumin: 5.4 g/dL — ABNORMAL HIGH (ref 3.5–5.2)
Alkaline Phosphatase: 109 U/L (ref 39–117)
Anion gap: 26 — ABNORMAL HIGH (ref 5–15)
BUN: 12 mg/dL (ref 6–23)
CO2: 19 mmol/L (ref 19–32)
Calcium: 9.9 mg/dL (ref 8.4–10.5)
Chloride: 94 mmol/L — ABNORMAL LOW (ref 96–112)
Creatinine, Ser: 0.8 mg/dL (ref 0.50–1.35)
GFR calc Af Amer: >90 mL/min (ref >90)
GFR calc non Af Amer: >90 mL/min (ref >90)
Glucose, Bld: 83 mg/dL (ref 70–99)
Potassium: 4.7 mmol/L (ref 3.5–5.1)
Sodium: 139 mmol/L (ref 135–145)
Total Bilirubin: 0.7 mg/dL (ref 0.3–1.2)
Total Protein: 8.5 g/dL — ABNORMAL HIGH (ref 6.0–8.3)

## 2014-09-11 LAB — ETHANOL: Alcohol, Ethyl (B): 24 mg/dL — ABNORMAL HIGH (ref 0–9)

## 2014-09-11 MED ORDER — LORAZEPAM 1 MG PO TABS: 0.0000 mg | ORAL_TABLET | Freq: Two times a day (BID) | ORAL | Status: DC

## 2014-09-11 MED ORDER — ONDANSETRON HCL 4 MG PO TABS: 4.0000 mg | ORAL_TABLET | Freq: Three times a day (TID) | ORAL | Status: DC | PRN

## 2014-09-11 MED ORDER — NICOTINE 21 MG/24HR TD PT24
21.0000 mg | MEDICATED_PATCH | Freq: Every day | TRANSDERMAL | Status: DC
  Administered 2014-09-12 (×2): 21 mg via TRANSDERMAL
  Filled 2014-09-11 (×2): qty 1

## 2014-09-11 MED ORDER — ZOLPIDEM TARTRATE 5 MG PO TABS: 5.0000 mg | ORAL_TABLET | Freq: Every evening | ORAL | Status: DC | PRN

## 2014-09-11 MED ORDER — SODIUM CHLORIDE 0.9 % IV BOLUS (SEPSIS)
1000.0000 mL | Freq: Once | INTRAVENOUS | Status: AC
  Administered 2014-09-12: 1000 mL via INTRAVENOUS

## 2014-09-11 MED ORDER — ALUM & MAG HYDROXIDE-SIMETH 200-200-20 MG/5ML PO SUSP: 30.0000 mL | ORAL | Status: DC | PRN

## 2014-09-11 MED ORDER — LORAZEPAM 1 MG PO TABS
0.0000 mg | ORAL_TABLET | Freq: Four times a day (QID) | ORAL | Status: DC
  Administered 2014-09-12 (×3): 1 mg via ORAL
  Filled 2014-09-11 (×3): qty 1

## 2014-09-11 MED ORDER — ACETAMINOPHEN 325 MG PO TABS
650.0000 mg | ORAL_TABLET | ORAL | Status: DC | PRN
  Administered 2014-09-12 (×2): 650 mg via ORAL
  Filled 2014-09-11 (×2): qty 2

## 2014-09-11 NOTE — BH Assessment (Signed)
Spoke with Antonietta Breach, PA prior to initiating assessment. Per Antonietta Breach, PA reports pt is shaking, reports drinking due to depression. Reports visual hallucinations due to hx of schizoaffective disorder and being out of his three medications for 4-5 days. He is requesting help with detox.    Assessment to commence shortly.    Lear Ng, [facility] Triage Specialist 09/11/2014 9:50 PM

## 2014-09-11 NOTE — ED Notes (Signed)
Pt is out of his psych meds and states that he wants etoh detox, last drink this am.

## 2014-09-11 NOTE — ED Notes (Signed)
Pt denies SI/HI 
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
## 2014-09-11 NOTE — ED Provider Notes (Signed)
CSN: [id number]     Arrival date & time 09/11/14  2119 History   First MD Initiated Contact with Patient 09/11/14 2130     Chief Complaint  Patient presents with  . Mental Health Problem   Patient is a 44 y.o. male presenting with mental health disorder. The history is provided by the patient. No language interpreter was used.  Mental Health Problem Presenting symptoms: hallucinations    This chart was scribed for non-physician practitioner Antonietta Breach, PA-C,  working with Alfonzo Beers, MD, by Thea Alken, ED Scribe. This patient was seen in room WA04/WA04 and the patient's care was started at 7:20 AM.  [name] is a 44 y.o. male who presents to the Emergency Department complaining of mental heath problems. Pt states he has been out of his zoloft and Seroquel medication for 4-5 days. Pt states he was supposed to do a walk-in at [facility] but was unable to make it due to being drunk. Pt is requesting a detox from alcohol. Pt states he drinks 20 malt beers a day and has done so for the past month. Pt's last drink was about 3-4 am  but began to have withdrawal symptoms at 12pm. Pt reports hx of seizures due to alcohol withdrawals. Pt states he has been hallucinating, seeing shadow people. Pt denies alcohol affecting hallucinations. Pt is an occasional marijuana user but denies other drugs. Pt denies self harm, SI/HI.     Past Medical History  Diagnosis Date  . Depression   . Psychosis   . PTSD (post-traumatic stress disorder)   . Seizure disorder     related to etoh seizure  . Alcohol abuse   . Schizoaffective disorder   . Anxiety    Past Surgical History  Procedure Laterality Date  . Foot surgery      right  . Hand surgery     History reviewed. No pertinent family history. History  Substance Use Topics  . Smoking status: Current Every Day Smoker -- 1.50 packs/day for 24 years    Types: Cigarettes  . Smokeless tobacco: Never Used  . Alcohol Use: 50.4 oz/week    84 Cans of  beer per week     Comment: 6-8 cans beer per day, 24 ounce cans    Review of Systems  Psychiatric/Behavioral: Positive for hallucinations and dysphoric mood.  All other systems reviewed and are negative.   Allergies  Review of patient's allergies indicates no known allergies.  Home Medications   Prior to Admission medications   Medication Sig Start Date End Date Taking? Authorizing Provider  carbamazepine (TEGRETOL) 200 MG tablet Take 1 tablet (200 mg total) by mouth at bedtime. 07/07/14  Yes Waylan Boga, NP  naproxen sodium (ANAPROX) 220 MG tablet Take 220 mg by mouth daily as needed (pain).   Yes Historical Provider, MD  QUEtiapine (SEROQUEL) 100 MG tablet Take 1 tablet (100 mg total) by mouth 2 (two) times daily. 07/07/14  Yes Waylan Boga, NP  sertraline (ZOLOFT) 50 MG tablet Take 1 tablet (50 mg total) by mouth daily. 07/07/14  Yes Waylan Boga, NP   BP 155/77 mmHg  Pulse 99  Temp(Src) 98.2 F (36.8 C) (Oral)  Resp 20  SpO2 95%   Physical Exam  Constitutional: He is oriented to person, place, and time. He appears well-developed and well-nourished. No distress.  Nontoxic/nonseptic appearing  HENT:  Head: Normocephalic and atraumatic.  Eyes: Conjunctivae and EOM are normal. No scleral icterus.  Neck: Normal range of motion.  Cardiovascular:  Regular rhythm and intact distal pulses.  Tachycardia present.   Pulmonary/Chest: Effort normal and breath sounds normal. No respiratory distress. He has no wheezes. He has no rales.  Respirations even and unlabored. Mild adventitious sounds consistent with smoking history.  Musculoskeletal: Normal range of motion.  Neurological: He is alert and oriented to person, place, and time. Coordination normal.  GCS 15. Speech is goal oriented. No slurring noted. Patient answers questions appropriately and follows simple commands. He is moving all extremities; appears mildly tremulous.  Skin: Skin is warm and dry. No rash noted. He is not  diaphoretic. No erythema. No pallor.  Psychiatric: His speech is normal. He is withdrawn. He exhibits a depressed mood. He expresses no homicidal and no suicidal ideation. He expresses no suicidal plans and no homicidal plans.  Nursing note and vitals reviewed.   ED Course  Procedures (including critical care time) Labs Review Labs Reviewed  COMPREHENSIVE METABOLIC PANEL - Abnormal; Notable for the following:    Chloride 94 (*)    Total Protein 8.5 (*)    Albumin 5.4 (*)    AST 49 (*)    Anion gap 26 (*)    All other components within normal limits  URINE RAPID DRUG SCREEN (HOSP PERFORMED) - Abnormal; Notable for the following:    Tetrahydrocannabinol POSITIVE (*)    All other components within normal limits  ETHANOL - Abnormal; Notable for the following:    Alcohol, Ethyl (B) 24 (*)    All other components within normal limits  ACETAMINOPHEN LEVEL - Abnormal; Notable for the following:    Acetaminophen (Tylenol), Serum <10.0 (*)    All other components within normal limits  BASIC METABOLIC PANEL - Abnormal; Notable for the following:    Chloride 94 (*)    Anion gap 24 (*)    All other components within normal limits  BASIC METABOLIC PANEL - Abnormal; Notable for the following:    Anion gap 17 (*)    All other components within normal limits  CBC WITH DIFFERENTIAL/PLATELET  SALICYLATE LEVEL  LACTIC ACID, PLASMA  I-STAT CG4 LACTIC ACID, ED    Imaging Review No results found.   EKG Interpretation None      MDM   Final diagnoses:  Alcohol abuse with physiological dependence  Noncompliance with medication regimen  Hallucinations, visual    44 year old male with a history of alcohol abuse and dependence presents to the emergency department for further evaluation of hallucinations secondary to medication noncompliance. He is also requesting alcohol detox. He states his last drink was at 0400 yesterday, 09/11/14. Patient denied SI/HI. His labs were initially significant  for an anion gap of 24. Suspect that this is secondary to metabolization of alcohol. His anion gap has improved with IV fluids and banana bag. Anion gap recheck is 17 at 0710. He has been medically cleared at this time. Patient evaluated by TTS; placement pending. Disposition to be determined by oncoming ED provider. Psych hold and CIWA orders placed.  I personally performed the services described in this documentation, which was scribed in my presence. The recorded information has been reviewed and is accurate.   Filed Vitals:   09/12/14 0337 09/12/14 0338 09/12/14 0631 09/12/14 0632  BP: 147/87 147/87 155/77 155/77  Pulse: 103 103 97 99  Temp: 98.1 F (36.7 C)  98.2 F (36.8 C)   TempSrc: Oral  Oral   Resp: 20  20   SpO2: 93%  95%      Antonietta Breach, PA-C  09/12/14 [place], MD 09/12/14 [phone]

## 2014-09-12 DIAGNOSIS — F251 Schizoaffective disorder, depressive type: Secondary | ICD-10-CM

## 2014-09-12 DIAGNOSIS — R441 Visual hallucinations: Secondary | ICD-10-CM | POA: Insufficient documentation

## 2014-09-12 DIAGNOSIS — F1020 Alcohol dependence, uncomplicated: Secondary | ICD-10-CM | POA: Insufficient documentation

## 2014-09-12 DIAGNOSIS — F339 Major depressive disorder, recurrent, unspecified: Secondary | ICD-10-CM | POA: Diagnosis not present

## 2014-09-12 DIAGNOSIS — Z9114 Patient's other noncompliance with medication regimen: Secondary | ICD-10-CM | POA: Insufficient documentation

## 2014-09-12 LAB — BASIC METABOLIC PANEL
Anion gap: 24 — ABNORMAL HIGH (ref 5–15)
Anion gap: 17 — ABNORMAL HIGH (ref 5–15)
BUN: 13 mg/dL (ref 6–23)
BUN: 13 mg/dL (ref 6–23)
CO2: 20 mmol/L (ref 19–32)
CO2: 23 mmol/L (ref 19–32)
Calcium: 10.1 mg/dL (ref 8.4–10.5)
Calcium: 8.9 mg/dL (ref 8.4–10.5)
Chloride: 94 mmol/L — ABNORMAL LOW (ref 96–112)
Chloride: 96 mmol/L (ref 96–112)
Creatinine, Ser: 0.89 mg/dL (ref 0.50–1.35)
Creatinine, Ser: 0.78 mg/dL (ref 0.50–1.35)
GFR calc Af Amer: >90 mL/min (ref >90)
GFR calc Af Amer: >90 mL/min (ref >90)
GFR calc non Af Amer: >90 mL/min (ref >90)
GFR calc non Af Amer: >90 mL/min (ref >90)
Glucose, Bld: 79 mg/dL (ref 70–99)
Glucose, Bld: 70 mg/dL (ref 70–99)
Potassium: 4.2 mmol/L (ref 3.5–5.1)
Potassium: 3.8 mmol/L (ref 3.5–5.1)
Sodium: 138 mmol/L (ref 135–145)
Sodium: 136 mmol/L (ref 135–145)

## 2014-09-12 LAB — RAPID URINE DRUG SCREEN, HOSP PERFORMED
Amphetamines: NOT DETECTED
Barbiturates: NOT DETECTED
Benzodiazepines: NOT DETECTED
Cocaine: NOT DETECTED
Opiates: NOT DETECTED
Tetrahydrocannabinol: POSITIVE — AB

## 2014-09-12 LAB — SALICYLATE LEVEL: Salicylate Lvl: <4 mg/dL (ref 2.8–20.0)

## 2014-09-12 LAB — ACETAMINOPHEN LEVEL: Acetaminophen (Tylenol), Serum: <10 ug/mL — ABNORMAL LOW (ref 10–30)

## 2014-09-12 LAB — I-STAT CG4 LACTIC ACID, ED: Lactic Acid, Venous: 2.66 mmol/L (ref 0.5–2.0)

## 2014-09-12 MED ORDER — M.V.I. ADULT IV INJ
INJECTION | Freq: Once | INTRAVENOUS | Status: AC
  Administered 2014-09-12: 03:00:00 via INTRAVENOUS
  Filled 2014-09-12: qty 1000

## 2014-09-12 MED ORDER — LORAZEPAM 2 MG/ML IJ SOLN
1.0000 mg | Freq: Once | INTRAMUSCULAR | Status: AC
  Administered 2014-09-12: 1 mg via INTRAVENOUS
  Filled 2014-09-12: qty 1

## 2014-09-12 MED ORDER — LORAZEPAM 1 MG PO TABS
1.0000 mg | ORAL_TABLET | Freq: Once | ORAL | Status: AC
  Administered 2014-09-12: 1 mg via ORAL
  Filled 2014-09-12: qty 1

## 2014-09-12 NOTE — ED Notes (Signed)
Changed into burgundy scrubs and wanded by security

## 2014-09-12 NOTE — Consult Note (Signed)
[facility] Consult   Reason for Consult:  Alcohol Dependence, Intoxication Referring Physician:  EDP Patient Identification: [name] MRN:  [id number] Principal Diagnosis: Schizoaffective disorder, depressive type Diagnosis:   Patient Active Problem List   Diagnosis Date Noted  . Alcohol dependence [F10.20] 11/03/2011    Priority: High  . Hallucinations [R44.3] 07/07/2014  . Paranoia [F22] 07/07/2014  . Abnormal liver enzymes [R74.8] 02/14/2014  . Cannabis abuse [F12.10] 12/30/2013  . Schizoaffective disorder [F25.9] 12/29/2013  . Post traumatic stress disorder (PTSD) [F43.10] 11/03/2011  . Active smoker [Z72.0] 06/30/2011    Total Time spent with patient: 1 hour  Subjective:   [name] is a 44 y.o. male patient admitted with Alcohol dependence, Schizoaffective disorder, depressed type.  HPI:  Caucasian male, 44 years old was seen for alcohol use and intoxication.  Patient reported drinking 20 beers a day to a case daily.  Patient uses Marijuana once or twice a month.  Patient was treated for Alcohol dependence and intoxication in 2012 at [facility].  Patient reported a diagnosis of Schizoaffective disorder, PTSD and Depression.  He   Patient sees a Teacher, music at [facility] and receives his medications at [facility].  Patient reports previous Alcohol withdrawal seizures.  Patient reported auditory and visual hallucination today but stated that it is not related to his withdrawal.  Patient denies SI/HI.  Patient reported poor sleep and appetite.  We have accepted patient for admission and he has been assigned a bed at [facility] by Dr  Lorrene Reid, Psychiatrist.  Patient exhibited tremors of his fingers and c/o nausea.  He is already placed on our Ativan Detox protocol.  Patient will be transnferred as transportation is available.  HPI Elements:   Location:  Schizoaffective disorder, Alcohol Dependence and intoxication.. Quality:  Tremors, anxiety, nausea, A/V  hallucination. Severity:  severe. Timing:  Acute, ongoing. Duration:  Chronic mental illness. Context:  Seeking alcohol treatment.  Past Medical History:  Past Medical History  Diagnosis Date  . Depression   . Psychosis   . PTSD (post-traumatic stress disorder)   . Seizure disorder     related to etoh seizure  . Alcohol abuse   . Schizoaffective disorder   . Anxiety     Past Surgical History  Procedure Laterality Date  . Foot surgery      right  . Hand surgery     Family History: History reviewed. No pertinent family history. Social History:  History  Alcohol Use  . 50.4 oz/week  . 84 Cans of beer per week    Comment: 6-8 cans beer per day, 24 ounce cans     History  Drug Use  . 1.00 per week  . Special: Marijuana    Comment: THC 2 to 3 times per month    History   Social History  . Marital Status: Single    Spouse Name: N/A    Number of Children: N/A  . Years of Education: N/A   Social History Main Topics  . Smoking status: Current Every Day Smoker -- 1.50 packs/day for 24 years    Types: Cigarettes  . Smokeless tobacco: Never Used  . Alcohol Use: 50.4 oz/week    84 Cans of beer per week     Comment: 6-8 cans beer per day, 24 ounce cans  . Drug Use: 1.00 per week    Special: Marijuana     Comment: THC 2 to 3 times per month  . Sexual Activity: Yes   Other Topics Concern  .  None   Social History Narrative   Additional Social History:    Pain Medications: SEE PTA Prescriptions: SEE PTA Over the Counter: SEE PTA  History of alcohol / drug use?: Yes Longest period of sobriety (when/how long): reports 3.5 years sober about 8 years ago, a couple of weeks more recently. Hx of seizures last one reports "a couple of months ago" Negative Consequences of Use: Legal (hx of multiple DUIs) Withdrawal Symptoms:  (shaking, reports hands and legs numb, stomackaches) Name of Substance 1: etoh  1 - Age of First Use: 14 1 - Amount (size/oz): 20 malt beers per  day  1 - Frequency: one month at this level 1 - Duration: on and off for years 1 - Last Use / Amount: 09-11-14 about 3 am about 20 beers Name of Substance 2: THC 2 - Age of First Use: 15 2 - Amount (size/oz): a couple of bowls 2 - Frequency: 1-2 times per month  2 - Duration: years 2 - Last Use / Amount: unspecified by pt                 Allergies:  No Known Allergies  Vitals: Blood pressure 155/80, pulse 94, temperature 98.6 F (37 C), temperature source Oral, resp. rate 17, SpO2 97 %.  Risk to Self: Suicidal Ideation: No-Not Currently/Within Last 6 Months Suicidal Intent: No-Not Currently/Within Last 6 Months Is patient at risk for suicide?: Yes Suicidal Plan?: No-Not Currently/Within Last 6 Months Access to Means: Yes Specify Access to Suicidal Means: overdose or cut, reports not planning or consistent ideation for that past couple of months What has been your use of drugs/alcohol within the last 12 months?: Pt has been abusing etoh sinceage14, and reports THC use since age 59. Recently drinking daily 20 malt beers per day.  How many times?: 4 Other Self Harm Risks: none Triggers for Past Attempts: Other (Comment) (fiance committed suicide in front of him) Intentional Self Injurious Behavior: Burning Comment - Self Injurious Behavior: reports has not done this in several years Risk to Others: Homicidal Ideation: No Thoughts of Harm to Others: No Current Homicidal Intent: No Current Homicidal Plan: No Access to Homicidal Means: No Identified Victim: none History of harm to others?: No Assessment of Violence: None Noted Violent Behavior Description: none Does patient have access to weapons?: No Criminal Charges Pending?: No Does patient have a court date: No Prior Inpatient Therapy: Prior Inpatient Therapy: Yes Prior Therapy Dates: 03/2011, "end of 2012, 10/2011, 12/29/2013, 02/09/2014, 05/04/2014, (others out of state) Prior Therapy Facilty/Provider(s): [facility], [facility],  reports some in [place], [place], and [place]  Reason for Treatment: SA, and schizoaffective disorder Prior Outpatient Therapy: Prior Outpatient Therapy: Yes Prior Therapy Dates: "a long time ago" Prior Therapy Facilty/Provider(s): [facility] Reason for Treatment: SA  Current Facility-Administered Medications  Medication Dose Route Frequency Provider Last Rate Last Dose  . acetaminophen (TYLENOL) tablet 650 mg  650 mg Oral Q4H PRN Antonietta Breach, PA-C   650 mg at 09/12/14 1048  . alum & mag hydroxide-simeth (MAALOX/MYLANTA) 200-200-20 MG/5ML suspension 30 mL  30 mL Oral PRN Antonietta Breach, PA-C      . LORazepam (ATIVAN) tablet 0-4 mg  0-4 mg Oral 4 times per day Antonietta Breach, PA-C   1 mg at 09/12/14 1326   Followed by  . [START ON 09/14/2014] LORazepam (ATIVAN) tablet 0-4 mg  0-4 mg Oral Q12H Kelly Humes, PA-C      . nicotine (NICODERM CQ - dosed in mg/24 hours) patch  21 mg  21 mg Transdermal Daily Antonietta Breach, PA-C   21 mg at 09/12/14 1002  . ondansetron (ZOFRAN) tablet 4 mg  4 mg Oral Q8H PRN Antonietta Breach, PA-C      . zolpidem (AMBIEN) tablet 5 mg  5 mg Oral QHS PRN Antonietta Breach, PA-C       Current Outpatient Prescriptions  Medication Sig Dispense Refill  . carbamazepine (TEGRETOL) 200 MG tablet Take 1 tablet (200 mg total) by mouth at bedtime. 30 tablet 1  . naproxen sodium (ANAPROX) 220 MG tablet Take 220 mg by mouth daily as needed (pain).    . QUEtiapine (SEROQUEL) 100 MG tablet Take 1 tablet (100 mg total) by mouth 2 (two) times daily. 60 tablet 1  . sertraline (ZOLOFT) 50 MG tablet Take 1 tablet (50 mg total) by mouth daily. 30 tablet 1    Musculoskeletal: Strength & Muscle Tone: within normal limits Gait & Station: normal Patient leans: N/A  Psychiatric Specialty Exam:     Blood pressure 155/80, pulse 94, temperature 98.6 F (37 C), temperature source Oral, resp. rate 17, SpO2 97 %.There is no weight on file to calculate BMI.  General Appearance: Casual  Eye Contact::  Good   Speech:  Clear and Coherent and Normal Rate  Volume:  Normal  Mood:  Anxious, Depressed and Hopeless  Affect:  Congruent, Depressed and Flat  Thought Process:  Coherent, Goal Directed and Intact  Orientation:  Full (Time, Place, and Person)  Thought Content:  Hallucinations: Auditory Visual  Suicidal Thoughts:  No  Homicidal Thoughts:  No  Memory:  Immediate;   Good Recent;   Good Remote;   Good  Judgement:  Fair  Insight:  Good  Psychomotor Activity:  Tremor  Concentration:  Good  Recall:  NA  Fund of Knowledge:Fair  Language: Good  Akathisia:  NA  Handed:  Right  AIMS (if indicated):     Assets:  Desire for Improvement  ADL's:  Intact  Cognition: WNL  Sleep:      Medical Decision Making: Review of Medication Regimen & Side Effects (2) and Review of New Medication or Change in Dosage (2)  Problem Points: Review of psycho-social stressors (1)  Data Points: Review of medication regiment & side effects (2) Review of new medications or change in dosage (2)  Treatment Plan Summary: Accepted at [facility]:  Recommend psychiatric Inpatient admission when medically cleared. Accepted at [facility] hospital Disposition: Send to [facility]  Charmaine Downs, Loletha Grayer   PMHNP-BC 09/12/2014 2:00 PM  Patient seen, evaluated and I agree with notes by Nurse Practitioner. Corena Pilgrim, MD

## 2014-09-12 NOTE — ED Notes (Signed)
Reassessment of Ativan 1mg   PO, patient is lying on his left side, resting quietly with eyes closed. Appears in no acute distress. Will reassess CIWA when patient wakes up.

## 2014-09-12 NOTE — BH Assessment (Signed)
npt recommended. No appropriate [facility] bed. Sent referrals to: [facility], [place] Triage Specialist 09/12/2014 5:12 AM

## 2014-09-12 NOTE — ED Notes (Signed)
Spoke with Fredderick Phenix, PA about his condition and new CIWA score. New order placed for an additional ATIVAN 1mg  PO for withdrawals.

## 2014-09-12 NOTE — ED Notes (Signed)
Pt came in for alcohol detox and refills on his psych meds; pt sts hes been drinking a case of beer x1 month and sts he has ran out of his psych meds a few days ago; denies SI/HI; sts hes hearing "bullying" voices and seeing shadows of people

## 2014-09-12 NOTE — ED Notes (Signed)
Patient resting quietly on his left side with his eyes closed. Oberserved rise and fall of chest. Pt appears in no distress.

## 2014-09-12 NOTE — ED Notes (Signed)
MD at bedside. 

## 2014-09-12 NOTE — ED Notes (Signed)
2585 bed 2 @ [facility] Dr. Arrie Aran [phone] Voluntary status

## 2014-09-12 NOTE — ED Notes (Signed)
[facility] called for tx

## 2014-09-12 NOTE — BH Assessment (Signed)
[facility] Assessment Progress Note  At 13:50 I spoke to [employer] at [facility].  She reports that pt has been accepted to their facility by Dr Lorrene Reid to Rm [id number].  I then spoke to Charmaine Downs, NP, as well as the pt about this plan, and they are both agreeable to the transfer.  At 13:52 I spoke to pt's nurse, Estill Bamberg, and she agrees to call report to [phone].  Pt is under voluntary status, and is to be transported via [facility].  He agrees to sign consent for admission once he arrives at [place], [place] Triage Specialist 09/12/2014 @ 13:55

## 2014-09-12 NOTE — ED Notes (Signed)
Gave report to [place] RN at [facility]

## 2014-09-12 NOTE — ED Notes (Signed)
I-stat lactic acid result is 2.66

## 2014-09-13 DIAGNOSIS — F339 Major depressive disorder, recurrent, unspecified: Secondary | ICD-10-CM | POA: Diagnosis not present

## 2014-09-14 DIAGNOSIS — F339 Major depressive disorder, recurrent, unspecified: Secondary | ICD-10-CM | POA: Diagnosis not present

## 2014-09-15 DIAGNOSIS — F339 Major depressive disorder, recurrent, unspecified: Secondary | ICD-10-CM | POA: Diagnosis not present

## 2014-09-16 DIAGNOSIS — F348 Other persistent mood [affective] disorders: Secondary | ICD-10-CM | POA: Diagnosis not present

## 2014-09-17 DIAGNOSIS — F348 Other persistent mood [affective] disorders: Secondary | ICD-10-CM | POA: Diagnosis not present

## 2014-09-18 DIAGNOSIS — F339 Major depressive disorder, recurrent, unspecified: Secondary | ICD-10-CM | POA: Diagnosis not present

## 2014-10-18 ENCOUNTER — Emergency Department (HOSPITAL_COMMUNITY): Payer: Medicare Other

## 2014-10-18 ENCOUNTER — Encounter (HOSPITAL_COMMUNITY): Payer: Self-pay | Admitting: Emergency Medicine

## 2014-10-18 ENCOUNTER — Inpatient Hospital Stay (HOSPITAL_COMMUNITY)
Admission: EM | Admit: 2014-10-18 | Discharge: 2014-10-24 | DRG: 896 | Payer: Medicare Other | Attending: Internal Medicine | Admitting: Internal Medicine

## 2014-10-18 DIAGNOSIS — R45851 Suicidal ideations: Secondary | ICD-10-CM | POA: Diagnosis present

## 2014-10-18 DIAGNOSIS — D61818 Other pancytopenia: Secondary | ICD-10-CM | POA: Diagnosis present

## 2014-10-18 DIAGNOSIS — K7010 Alcoholic hepatitis without ascites: Secondary | ICD-10-CM | POA: Diagnosis present

## 2014-10-18 DIAGNOSIS — E871 Hypo-osmolality and hyponatremia: Secondary | ICD-10-CM | POA: Diagnosis present

## 2014-10-18 DIAGNOSIS — S3992XA Unspecified injury of lower back, initial encounter: Secondary | ICD-10-CM | POA: Diagnosis not present

## 2014-10-18 DIAGNOSIS — R440 Auditory hallucinations: Secondary | ICD-10-CM | POA: Diagnosis not present

## 2014-10-18 DIAGNOSIS — R945 Abnormal results of liver function studies: Secondary | ICD-10-CM | POA: Insufficient documentation

## 2014-10-18 DIAGNOSIS — E43 Unspecified severe protein-calorie malnutrition: Secondary | ICD-10-CM | POA: Diagnosis present

## 2014-10-18 DIAGNOSIS — F1024 Alcohol dependence with alcohol-induced mood disorder: Secondary | ICD-10-CM | POA: Diagnosis not present

## 2014-10-18 DIAGNOSIS — F4312 Post-traumatic stress disorder, chronic: Secondary | ICD-10-CM | POA: Insufficient documentation

## 2014-10-18 DIAGNOSIS — G4700 Insomnia, unspecified: Secondary | ICD-10-CM | POA: Diagnosis not present

## 2014-10-18 DIAGNOSIS — E8809 Other disorders of plasma-protein metabolism, not elsewhere classified: Secondary | ICD-10-CM | POA: Diagnosis present

## 2014-10-18 DIAGNOSIS — F1099 Alcohol use, unspecified with unspecified alcohol-induced disorder: Secondary | ICD-10-CM | POA: Diagnosis not present

## 2014-10-18 DIAGNOSIS — F1020 Alcohol dependence, uncomplicated: Secondary | ICD-10-CM

## 2014-10-18 DIAGNOSIS — F251 Schizoaffective disorder, depressive type: Secondary | ICD-10-CM | POA: Diagnosis not present

## 2014-10-18 DIAGNOSIS — D72819 Decreased white blood cell count, unspecified: Secondary | ICD-10-CM | POA: Diagnosis present

## 2014-10-18 DIAGNOSIS — F10929 Alcohol use, unspecified with intoxication, unspecified: Secondary | ICD-10-CM

## 2014-10-18 DIAGNOSIS — F23 Brief psychotic disorder: Secondary | ICD-10-CM | POA: Diagnosis not present

## 2014-10-18 DIAGNOSIS — F10239 Alcohol dependence with withdrawal, unspecified: Principal | ICD-10-CM | POA: Diagnosis present

## 2014-10-18 DIAGNOSIS — D696 Thrombocytopenia, unspecified: Secondary | ICD-10-CM | POA: Diagnosis present

## 2014-10-18 DIAGNOSIS — E876 Hypokalemia: Secondary | ICD-10-CM | POA: Diagnosis present

## 2014-10-18 DIAGNOSIS — R748 Abnormal levels of other serum enzymes: Secondary | ICD-10-CM | POA: Diagnosis present

## 2014-10-18 DIAGNOSIS — M533 Sacrococcygeal disorders, not elsewhere classified: Secondary | ICD-10-CM | POA: Diagnosis not present

## 2014-10-18 DIAGNOSIS — R740 Nonspecific elevation of levels of transaminase and lactic acid dehydrogenase [LDH]: Secondary | ICD-10-CM | POA: Diagnosis present

## 2014-10-18 DIAGNOSIS — F10129 Alcohol abuse with intoxication, unspecified: Secondary | ICD-10-CM | POA: Insufficient documentation

## 2014-10-18 DIAGNOSIS — F17210 Nicotine dependence, cigarettes, uncomplicated: Secondary | ICD-10-CM | POA: Diagnosis present

## 2014-10-18 DIAGNOSIS — F10921 Alcohol use, unspecified with intoxication delirium: Secondary | ICD-10-CM

## 2014-10-18 DIAGNOSIS — R749 Abnormal serum enzyme level, unspecified: Secondary | ICD-10-CM | POA: Diagnosis not present

## 2014-10-18 DIAGNOSIS — F22 Delusional disorders: Secondary | ICD-10-CM | POA: Diagnosis present

## 2014-10-18 DIAGNOSIS — R443 Hallucinations, unspecified: Secondary | ICD-10-CM | POA: Diagnosis not present

## 2014-10-18 DIAGNOSIS — F329 Major depressive disorder, single episode, unspecified: Secondary | ICD-10-CM | POA: Diagnosis present

## 2014-10-18 DIAGNOSIS — F259 Schizoaffective disorder, unspecified: Secondary | ICD-10-CM | POA: Diagnosis present

## 2014-10-18 DIAGNOSIS — R7989 Other specified abnormal findings of blood chemistry: Secondary | ICD-10-CM | POA: Insufficient documentation

## 2014-10-18 DIAGNOSIS — Z6826 Body mass index (BMI) 26.0-26.9, adult: Secondary | ICD-10-CM | POA: Diagnosis not present

## 2014-10-18 DIAGNOSIS — F1290 Cannabis use, unspecified, uncomplicated: Secondary | ICD-10-CM | POA: Diagnosis present

## 2014-10-18 DIAGNOSIS — G40909 Epilepsy, unspecified, not intractable, without status epilepticus: Secondary | ICD-10-CM | POA: Diagnosis present

## 2014-10-18 DIAGNOSIS — M549 Dorsalgia, unspecified: Secondary | ICD-10-CM | POA: Diagnosis present

## 2014-10-18 DIAGNOSIS — F419 Anxiety disorder, unspecified: Secondary | ICD-10-CM | POA: Diagnosis present

## 2014-10-18 DIAGNOSIS — F99 Mental disorder, not otherwise specified: Secondary | ICD-10-CM | POA: Diagnosis not present

## 2014-10-18 DIAGNOSIS — F4310 Post-traumatic stress disorder, unspecified: Secondary | ICD-10-CM | POA: Diagnosis present

## 2014-10-18 DIAGNOSIS — M545 Low back pain: Secondary | ICD-10-CM | POA: Diagnosis not present

## 2014-10-18 DIAGNOSIS — F258 Other schizoaffective disorders: Secondary | ICD-10-CM | POA: Diagnosis not present

## 2014-10-18 DIAGNOSIS — F10229 Alcohol dependence with intoxication, unspecified: Secondary | ICD-10-CM | POA: Diagnosis not present

## 2014-10-18 LAB — CBC WITH DIFFERENTIAL/PLATELET
Basophils Absolute: 0 10*3/uL (ref 0.0–0.1)
Basophils Relative: 0 % (ref 0–1)
Eosinophils Absolute: 0 10*3/uL (ref 0.0–0.7)
Eosinophils Relative: 1 % (ref 0–5)
HCT: 35.6 % — ABNORMAL LOW (ref 39.0–52.0)
Hemoglobin: 12.7 g/dL — ABNORMAL LOW (ref 13.0–17.0)
Lymphocytes Relative: 35 % (ref 12–46)
Lymphs Abs: 1.2 10*3/uL (ref 0.7–4.0)
MCH: 32.6 pg (ref 26.0–34.0)
MCHC: 35.7 g/dL (ref 30.0–36.0)
MCV: 91.3 fL (ref 78.0–100.0)
Monocytes Absolute: 0.3 10*3/uL (ref 0.1–1.0)
Monocytes Relative: 9 % (ref 3–12)
Neutro Abs: 1.9 10*3/uL (ref 1.7–7.7)
Neutrophils Relative %: 55 % (ref 43–77)
Platelets: 100 10*3/uL — ABNORMAL LOW (ref 150–400)
RBC: 3.9 MIL/uL — ABNORMAL LOW (ref 4.22–5.81)
RDW: 14 % (ref 11.5–15.5)
WBC: 3.4 10*3/uL — ABNORMAL LOW (ref 4.0–10.5)

## 2014-10-18 LAB — URINALYSIS, ROUTINE W REFLEX MICROSCOPIC
Glucose, UA: NEGATIVE mg/dL
Hgb urine dipstick: NEGATIVE
Ketones, ur: >80 mg/dL — AB
Leukocytes, UA: NEGATIVE
Nitrite: NEGATIVE
Protein, ur: 30 mg/dL — AB
Specific Gravity, Urine: 1.018 (ref 1.005–1.030)
Urobilinogen, UA: 2 mg/dL — ABNORMAL HIGH (ref 0.0–1.0)
pH: 6 (ref 5.0–8.0)

## 2014-10-18 LAB — ETHANOL: Alcohol, Ethyl (B): 362 mg/dL — ABNORMAL HIGH (ref 0–9)

## 2014-10-18 LAB — COMPREHENSIVE METABOLIC PANEL
ALT: 353 U/L — ABNORMAL HIGH (ref 0–53)
AST: 510 U/L — ABNORMAL HIGH (ref 0–37)
Albumin: 4.3 g/dL (ref 3.5–5.2)
Alkaline Phosphatase: 121 U/L — ABNORMAL HIGH (ref 39–117)
Anion gap: 19 — ABNORMAL HIGH (ref 5–15)
BUN: 8 mg/dL (ref 6–23)
CO2: 25 mmol/L (ref 19–32)
Calcium: 8.6 mg/dL (ref 8.4–10.5)
Chloride: 82 mmol/L — ABNORMAL LOW (ref 96–112)
Creatinine, Ser: 0.58 mg/dL (ref 0.50–1.35)
GFR calc Af Amer: >90 mL/min (ref >90)
GFR calc non Af Amer: >90 mL/min (ref >90)
Glucose, Bld: 89 mg/dL (ref 70–99)
Potassium: 3.5 mmol/L (ref 3.5–5.1)
Sodium: 126 mmol/L — ABNORMAL LOW (ref 135–145)
Total Bilirubin: 1 mg/dL (ref 0.3–1.2)
Total Protein: 6.9 g/dL (ref 6.0–8.3)

## 2014-10-18 LAB — PHOSPHORUS: Phosphorus: 2.9 mg/dL (ref 2.3–4.6)

## 2014-10-18 LAB — RAPID URINE DRUG SCREEN, HOSP PERFORMED
Amphetamines: NOT DETECTED
Barbiturates: NOT DETECTED
Benzodiazepines: NOT DETECTED
Cocaine: NOT DETECTED
Opiates: NOT DETECTED
Tetrahydrocannabinol: POSITIVE — AB

## 2014-10-18 LAB — LIPASE, BLOOD: Lipase: 48 U/L (ref 11–59)

## 2014-10-18 LAB — MAGNESIUM: Magnesium: 2 mg/dL (ref 1.5–2.5)

## 2014-10-18 LAB — URINE MICROSCOPIC-ADD ON

## 2014-10-18 LAB — AMMONIA: Ammonia: 29 umol/L (ref 11–32)

## 2014-10-18 LAB — PROTIME-INR
INR: 0.86 (ref 0.00–1.49)
Prothrombin Time: 11.8 seconds (ref 11.6–15.2)

## 2014-10-18 MED ORDER — THIAMINE HCL 100 MG/ML IJ SOLN: 100.0000 mg | Freq: Every day | INTRAMUSCULAR | Status: DC

## 2014-10-18 MED ORDER — VITAMIN B-1 100 MG PO TABS
100.0000 mg | ORAL_TABLET | Freq: Every day | ORAL | Status: DC
  Administered 2014-10-18 – 2014-10-24 (×7): 100 mg via ORAL
  Filled 2014-10-18 (×8): qty 1

## 2014-10-18 MED ORDER — SODIUM CHLORIDE 0.9 % IV BOLUS (SEPSIS)
1000.0000 mL | Freq: Once | INTRAVENOUS | Status: AC
  Administered 2014-10-18: 1000 mL via INTRAVENOUS

## 2014-10-18 MED ORDER — LORAZEPAM 1 MG PO TABS: 0.0000 mg | ORAL_TABLET | Freq: Two times a day (BID) | ORAL | Status: DC

## 2014-10-18 MED ORDER — LORAZEPAM 1 MG PO TABS
0.0000 mg | ORAL_TABLET | Freq: Four times a day (QID) | ORAL | Status: DC
  Administered 2014-10-18: 1 mg via ORAL
  Administered 2014-10-19: 2 mg via ORAL
  Filled 2014-10-18 (×2): qty 2

## 2014-10-18 NOTE — ED Notes (Signed)
Pt in radiology. Will administer medication when pt returns.

## 2014-10-18 NOTE — Progress Notes (Signed)
EDCM spoke to patient at bedside.  Patient confirms he has Medicare insurance without a pcp.  EDCM provided patient with list of pcps who accept Medicare insurance within a ten mile radius of patient's zip code.  Patient thankful for resources.  No further EDCM needs at this time.

## 2014-10-18 NOTE — ED Notes (Signed)
Per EMS: Pt from home.  Pt c/o auditory hallucinations telling him to hurt himself.  Denies plan.  Intoxicated.

## 2014-10-18 NOTE — ED Notes (Signed)
Chan drawing blood, will get vitals after.

## 2014-10-18 NOTE — ED Notes (Signed)
Pt given urinal, instructed to provide urine sample.

## 2014-10-18 NOTE — ED Notes (Signed)
Bed: OB09 Expected date:  Expected time:  Means of arrival:  Comments: [id number]

## 2014-10-18 NOTE — H&P (Signed)
PCP: none   Chief Complaint:  hallucinations  HPI: [name] is a 44 y.o. male   has a past medical history of Depression; Psychosis; PTSD (post-traumatic stress disorder); Seizure disorder; Alcohol abuse; Schizoaffective disorder; and Anxiety.   Presented with  Patient with hx of Alcohol abuse and schizoaffective disorder have had auditory hallucinations chronically but recently he started to have them non-stop. He has had command hallucinations telling him to kill himself. He has not gone back to [place] for few months. His alcohol intake has increased. He has been binge drinking fo rthe past 1 month. Hx of alcohol withdrawal with seizures. Last drink was 10/18/14 at 3 PM feels tremulous now. In ER Na noted 126. Had a recent fall. CT head unremarkable. Patient reports drinking all day and takes very little otherwise by mouth.   Hospitalist was called for admission for hyponatremia most likely secondary to beer potomania  Review of Systems:    Pertinent positives include tremor, hallucinations. depression or anxiety. nausea, vomiting,   Constitutional:  No weight loss, night sweats, Fevers, chills, fatigue, weight loss  HEENT:  No headaches, Difficulty swallowing,Tooth/dental problems,Sore throat,  No sneezing, itching, ear ache, nasal congestion, post nasal drip,  Cardio-vascular:  No chest pain, Orthopnea, PND, anasarca, dizziness, palpitations.no Bilateral lower extremity swelling  GI:  No heartburn, indigestion, abdominal pain, diarrhea, change in bowel habits, loss of appetite, melena, blood in stool, hematemesis Resp:  no shortness of breath at rest. No dyspnea on exertion, No excess mucus, no productive cough, No non-productive cough, No coughing up of blood.No change in color of mucus.No wheezing. Skin:  no rash or lesions. No jaundice GU:  no dysuria, change in color of urine, no urgency or frequency. No straining to urinate.  No flank pain.  Musculoskeletal:  No  joint pain or no joint swelling. No decreased range of motion. No back pain.  Psych:  No change in mood or affect. No  No memory loss.  Neuro: no localizing neurological complaints, no tingling, no weakness, no double vision, no gait abnormality, no slurred speech, no confusion  Otherwise ROS are negative except for above, 10 systems were reviewed  Past Medical History: Past Medical History  Diagnosis Date  . Depression   . Psychosis   . PTSD (post-traumatic stress disorder)   . Seizure disorder     related to etoh seizure  . Alcohol abuse   . Schizoaffective disorder   . Anxiety    Past Surgical History  Procedure Laterality Date  . Foot surgery      right  . Hand surgery       Medications: Prior to Admission medications   Medication Sig Start Date End Date Taking? Authorizing Provider  carbamazepine (TEGRETOL) 200 MG tablet Take 1 tablet (200 mg total) by mouth at bedtime. 07/07/14  Yes Waylan Boga, NP  naproxen sodium (ANAPROX) 220 MG tablet Take 220 mg by mouth daily as needed (pain).   Yes Historical Provider, MD  QUEtiapine (SEROQUEL) 100 MG tablet Take 1 tablet (100 mg total) by mouth 2 (two) times daily. Patient taking differently: Take 100-300 mg by mouth 2 (two) times daily. Take 100 mg every morning Take 300 mg at bedtime 07/07/14  Yes Waylan Boga, NP  sertraline (ZOLOFT) 50 MG tablet Take 1 tablet (50 mg total) by mouth daily. 07/07/14  Yes Waylan Boga, NP    Allergies:   Allergies  Allergen Reactions  . Wellbutrin [Bupropion] Other (See Comments)    Makes me  feel like I'm have a seizure    Social History:  Ambulatory  independently  Lives at home   With family     reports that he has been smoking Cigarettes.  He has a 36 pack-year smoking history. He has never used smokeless tobacco. He reports that he drinks about 50.4 oz of alcohol per week. He reports that he uses illicit drugs (Marijuana) about once per week.    Family History: family history  includes Alcoholism in his mother; CAD in his father; Depression in his brother.    Physical Exam: Patient Vitals for the past 24 hrs:  BP Temp Temp src Pulse Resp SpO2  10/18/14 2107 118/69 mmHg - - 85 16 91 %  10/18/14 1940 116/61 mmHg 97.4 F (36.3 C) Oral 87 16 92 %  10/18/14 1841 119/72 mmHg - - 86 - -  10/18/14 1659 119/72 mmHg 97.6 F (36.4 C) Oral 86 18 94 %    1. General:  in No Acute distress 2. Psychological: Alert and   Oriented 3. Head/ENT:   Moist   Mucous Membranes                          Head Non traumatic, neck supple                          Normal  Dentition 4. SKIN: decreased Skin turgor,  Skin clean Dry and intact no rash 5. Heart: Regular rate and rhythm no Murmur, Rub or gallop 6. Lungs: Clear to auscultation bilaterally, no wheezes or crackles   7. Abdomen: Right upper quadrant tenderness Non distended 8. Lower extremities: no clubbing, cyanosis, or edema 9. Neurologically Grossly intact, moving all 4 extremities equally, tremoulus 10. MSK: Normal range of motion  body mass index is unknown because there is no weight on file.   Labs on Admission:   Results for orders placed or performed during the hospital encounter of 10/18/14 (from the past 24 hour(s))  Comprehensive metabolic panel     Status: Abnormal   Collection Time: 10/18/14  5:59 PM  Result Value Ref Range   Sodium 126 (L) 135 - 145 mmol/L   Potassium 3.5 3.5 - 5.1 mmol/L   Chloride 82 (L) 96 - 112 mmol/L   CO2 25 19 - 32 mmol/L   Glucose, Bld 89 70 - 99 mg/dL   BUN 8 6 - 23 mg/dL   Creatinine, Ser 0.58 0.50 - 1.35 mg/dL   Calcium 8.6 8.4 - 10.5 mg/dL   Total Protein 6.9 6.0 - 8.3 g/dL   Albumin 4.3 3.5 - 5.2 g/dL   AST 510 (H) 0 - 37 U/L   ALT 353 (H) 0 - 53 U/L   Alkaline Phosphatase 121 (H) 39 - 117 U/L   Total Bilirubin 1.0 0.3 - 1.2 mg/dL   GFR calc non Af Amer >90 >90 mL/min   GFR calc Af Amer >90 >90 mL/min   Anion gap 19 (H) 5 - 15  Lipase, blood     Status: None    Collection Time: 10/18/14  6:00 PM  Result Value Ref Range   Lipase 48 11 - 59 U/L  Ethanol     Status: Abnormal   Collection Time: 10/18/14  6:11 PM  Result Value Ref Range   Alcohol, Ethyl (B) 362 (H) 0 - 9 mg/dL  Ammonia     Status: None   Collection Time: 10/18/14  6:13 PM  Result Value Ref Range   Ammonia 29 11 - 32 umol/L  CBC with Differential/Platelet     Status: Abnormal   Collection Time: 10/18/14  6:45 PM  Result Value Ref Range   WBC 3.4 (L) 4.0 - 10.5 K/uL   RBC 3.90 (L) 4.22 - 5.81 MIL/uL   Hemoglobin 12.7 (L) 13.0 - 17.0 g/dL   HCT 35.6 (L) 39.0 - 52.0 %   MCV 91.3 78.0 - 100.0 fL   MCH 32.6 26.0 - 34.0 pg   MCHC 35.7 30.0 - 36.0 g/dL   RDW 14.0 11.5 - 15.5 %   Platelets 100 (L) 150 - 400 K/uL   Neutrophils Relative % 55 43 - 77 %   Neutro Abs 1.9 1.7 - 7.7 K/uL   Lymphocytes Relative 35 12 - 46 %   Lymphs Abs 1.2 0.7 - 4.0 K/uL   Monocytes Relative 9 3 - 12 %   Monocytes Absolute 0.3 0.1 - 1.0 K/uL   Eosinophils Relative 1 0 - 5 %   Eosinophils Absolute 0.0 0.0 - 0.7 K/uL   Basophils Relative 0 0 - 1 %   Basophils Absolute 0.0 0.0 - 0.1 K/uL  Drug screen panel, emergency     Status: Abnormal   Collection Time: 10/18/14  6:56 PM  Result Value Ref Range   Opiates NONE DETECTED NONE DETECTED   Cocaine NONE DETECTED NONE DETECTED   Benzodiazepines NONE DETECTED NONE DETECTED   Amphetamines NONE DETECTED NONE DETECTED   Tetrahydrocannabinol POSITIVE (A) NONE DETECTED   Barbiturates NONE DETECTED NONE DETECTED  Urinalysis, Routine w reflex microscopic     Status: Abnormal   Collection Time: 10/18/14  7:01 PM  Result Value Ref Range   Color, Urine AMBER (A) YELLOW   APPearance CLEAR CLEAR   Specific Gravity, Urine 1.018 1.005 - 1.030   pH 6.0 5.0 - 8.0   Glucose, UA NEGATIVE NEGATIVE mg/dL   Hgb urine dipstick NEGATIVE NEGATIVE   Bilirubin Urine MODERATE (A) NEGATIVE   Ketones, ur >80 (A) NEGATIVE mg/dL   Protein, ur 30 (A) NEGATIVE mg/dL   Urobilinogen,  UA 2.0 (H) 0.0 - 1.0 mg/dL   Nitrite NEGATIVE NEGATIVE   Leukocytes, UA NEGATIVE NEGATIVE  Urine microscopic-add on     Status: None   Collection Time: 10/18/14  7:01 PM  Result Value Ref Range   Squamous Epithelial / LPF RARE RARE   WBC, UA 0-2 <3 WBC/hpf   Bacteria, UA RARE RARE    UA Ketones >80  No results found for: HGBA1C  CrCl cannot be calculated (Unknown ideal weight.).  BNP (last 3 results) No results for input(s): PROBNP in the last 8760 hours.  Other results:  I have pearsonaly reviewed this: ECG  Not obtained   There were no vitals filed for this visit.   Cultures:    Component Value Date/Time   SDES URINE, RANDOM 05/03/2014 1936   SPECREQUEST Normal 05/03/2014 1936   CULT NO GROWTH Performed at [facility] 05/03/2014 1936   REPTSTATUS 05/04/2014 FINAL 05/03/2014 1936     Radiological Exams on Admission: Dg Lumbar Spine Complete  10/18/2014   CLINICAL DATA:  Fall today with lumbar back pain and tailbone pain. No radiation of symptoms. Initial encounter.  EXAM: LUMBAR SPINE - COMPLETE 4+ VIEW  COMPARISON:  None.  FINDINGS: Alignment is anatomic. Vertebral body and disc space height are maintained. No definite pars defects. No endplate degenerative changes.  IMPRESSION: Normal exam.  Electronically Signed   By: Lorin Picket M.D.   On: 10/18/2014 18:49   Dg Sacrum/coccyx  10/18/2014   CLINICAL DATA:  Fall down steps with lumbar back pain and tailbone pain, no radiation of symptoms. Initial encounter.  EXAM: SACRUM AND COCCYX - 2+ VIEW  COMPARISON:  None.  FINDINGS: Sacrum and coccyx appear grossly intact.  IMPRESSION: No acute findings.   Electronically Signed   By: Lorin Picket M.D.   On: 10/18/2014 18:51   Ct Head Wo Contrast  10/18/2014   CLINICAL DATA:  Hallucinations for 1 month.  EXAM: CT HEAD WITHOUT CONTRAST  TECHNIQUE: Contiguous axial images were obtained from the base of the skull through the vertex without intravenous contrast.   COMPARISON:  Head CT scan 10/29/2011.  FINDINGS: The brain appears normal without hemorrhage, infarct, mass lesion, mass effect, midline shift or abnormal extra-axial fluid collection. No hydrocephalus or pneumocephalus. The calvarium is intact. Imaged paranasal sinuses and mastoid air cells are clear.  IMPRESSION: Negative head CT.   Electronically Signed   By: Inge Rise M.D.   On: 10/18/2014 20:29    Chart has been reviewed  Assessment/Plan  44 year old gentleman with history of alcohol abuse and schizoaffective disorder presents for worsening auditory hallucinations states he is interested in quitting drinking, patient is noted to have sodium of 126 most likely secondary to beer potomania and or liver disease  Present on Admission:  . Hyponatremia - likely combination of beer potomania versus liver disease  - patient already received 1 L normal saline in emergency department. Will follow sodium closely awoid rapid correction. Review of records this is changed from past one month patient is a symptomatic will treat conservatively  . Schizoaffective disorder - senior home medications  . Alcohol abuse with physiological dependence - CiWA protocol patient is at risk of severe withdrawal symptoms has history of seizures in the past  . Abnormal liver enzymes - possibly alcoholic hepatitis some right upper quadrant tenderness noted. Evaluate the right upper quadrant or sound 18 obtain hepatic panel although follow her liver functions. If does not improve or noted to be worsening would benefit from GI consult   Prophylaxis:  SCD Protonix  CODE STATUS:  FULL CODE    Other plan as per orders.  I have spent a total of 55 min on this admission  Delsa Walder 10/18/2014, 9:21 PM  [facility]  Pager [phone]   after 2 AM please page floor coverage PA If 7AM-7PM, please contact the day team taking care of the patient  [email or web address]  Password [email or web address]

## 2014-10-18 NOTE — ED Provider Notes (Signed)
CSN: [id number]     Arrival date & time 10/18/14  1658 History   First MD Initiated Contact with Patient 10/18/14 1742     Chief Complaint  Patient presents with  . Alcohol Intoxication  . Hallucinations     (Consider location/radiation/quality/duration/timing/severity/associated sxs/prior Treatment) HPI [name] is a 44 y.o. male with hx of psychosis, PTSD, alcohol abuse, schizoaffective disorder, presents to ED with complaint of hallucinations, hearing voices, suicidal thoughts, alcohol intoxication. Pt states he was sober for about a month and relapsed a month ago. States drinking daily, drinks "strong beer." States last drink this afternoon. States hearing multiple voices that are "screaming at me from different directions and telling me to hurt myself." Pt denies a plan. States also having difficulty ambulating. States fell down stairs at his apt about a week ago. Complaining of pain in lower back and sacrum/tail bone. Did not hit head. No other complaints.   Past Medical History  Diagnosis Date  . Depression   . Psychosis   . PTSD (post-traumatic stress disorder)   . Seizure disorder     related to etoh seizure  . Alcohol abuse   . Schizoaffective disorder   . Anxiety    Past Surgical History  Procedure Laterality Date  . Foot surgery      right  . Hand surgery     History reviewed. No pertinent family history. History  Substance Use Topics  . Smoking status: Current Every Day Smoker -- 1.50 packs/day for 24 years    Types: Cigarettes  . Smokeless tobacco: Never Used  . Alcohol Use: 50.4 oz/week    84 Cans of beer per week     Comment: 6-8 cans beer per day, 24 ounce cans    Review of Systems  Constitutional: Negative for fever and chills.  Respiratory: Negative for cough, chest tightness and shortness of breath.   Cardiovascular: Negative for chest pain, palpitations and leg swelling.  Gastrointestinal: Negative for nausea, vomiting, abdominal pain, diarrhea  and abdominal distention.  Genitourinary: Negative for dysuria, urgency, frequency and hematuria.  Musculoskeletal: Positive for back pain, arthralgias and gait problem. Negative for myalgias, neck pain and neck stiffness.  Skin: Negative for rash.  Allergic/Immunologic: Negative for immunocompromised state.  Neurological: Negative for dizziness, weakness, light-headedness, numbness and headaches.  Psychiatric/Behavioral: Positive for hallucinations and decreased concentration. The patient is nervous/anxious.   All other systems reviewed and are negative.     Allergies  Wellbutrin  Home Medications   Prior to Admission medications   Medication Sig Start Date End Date Taking? Authorizing Provider  carbamazepine (TEGRETOL) 200 MG tablet Take 1 tablet (200 mg total) by mouth at bedtime. 07/07/14  Yes Waylan Boga, NP  naproxen sodium (ANAPROX) 220 MG tablet Take 220 mg by mouth daily as needed (pain).   Yes Historical Provider, MD  QUEtiapine (SEROQUEL) 100 MG tablet Take 1 tablet (100 mg total) by mouth 2 (two) times daily. Patient taking differently: Take 100-300 mg by mouth 2 (two) times daily. Take 100 mg every morning Take 300 mg at bedtime 07/07/14  Yes Waylan Boga, NP  sertraline (ZOLOFT) 50 MG tablet Take 1 tablet (50 mg total) by mouth daily. 07/07/14  Yes Waylan Boga, NP   BP 119/72 mmHg  Pulse 86  Temp(Src) 97.6 F (36.4 C) (Oral)  Resp 18  SpO2 94% Physical Exam  Constitutional: He is oriented to person, place, and time. He appears well-developed and well-nourished. No distress.  HENT:  Head: Normocephalic and atraumatic.  Eyes: Conjunctivae are normal.  Neck: Neck supple.  Cardiovascular: Normal rate, regular rhythm and normal heart sounds.   Pulmonary/Chest: Effort normal. No respiratory distress. He has no wheezes. He has no rales.  Abdominal: Soft. Bowel sounds are normal. He exhibits no distension. There is no tenderness. There is no rebound.  Musculoskeletal:  He exhibits no edema.  Multiple contusions to the LE. Full rom of bilateral legs. TTP over midline lumbar spine and coccyx  Neurological: He is alert and oriented to person, place, and time. No cranial nerve deficit.  5/5 and equal lower extremity strength. 2+ and equal patellar reflexes bilaterally. Pt able to dorsiflex bilateral toes and feet with good strength against resistance. Equal sensation bilaterally over thighs and lower legs. Pt has a termor, coordination poor but able to do finger to nose  Skin: Skin is warm and dry.  Psychiatric: His speech is delayed. He is slowed. Thought content is delusional. Cognition and memory are impaired. He expresses inappropriate judgment.  Flat affect  Nursing note and vitals reviewed.   ED Course  Procedures (including critical care time) Labs Review Labs Reviewed  COMPREHENSIVE METABOLIC PANEL - Abnormal; Notable for the following:    Sodium 126 (*)    Chloride 82 (*)    AST 510 (*)    ALT 353 (*)    Alkaline Phosphatase 121 (*)    Anion gap 19 (*)    All other components within normal limits  ETHANOL - Abnormal; Notable for the following:    Alcohol, Ethyl (B) 362 (*)    All other components within normal limits  URINE RAPID DRUG SCREEN (HOSP PERFORMED) - Abnormal; Notable for the following:    Tetrahydrocannabinol POSITIVE (*)    All other components within normal limits  CBC WITH DIFFERENTIAL/PLATELET - Abnormal; Notable for the following:    WBC 3.4 (*)    RBC 3.90 (*)    Hemoglobin 12.7 (*)    HCT 35.6 (*)    Platelets 100 (*)    All other components within normal limits  URINALYSIS, ROUTINE W REFLEX MICROSCOPIC - Abnormal; Notable for the following:    Color, Urine AMBER (*)    Bilirubin Urine MODERATE (*)    Ketones, ur >80 (*)    Protein, ur 30 (*)    Urobilinogen, UA 2.0 (*)    All other components within normal limits  AMMONIA  URINE MICROSCOPIC-ADD ON  LIPASE, BLOOD  CBC WITH DIFFERENTIAL/PLATELET  HEPATITIS PANEL,  ACUTE  PHOSPHORUS  MAGNESIUM    Imaging Review Dg Lumbar Spine Complete  10/18/2014   CLINICAL DATA:  Fall today with lumbar back pain and tailbone pain. No radiation of symptoms. Initial encounter.  EXAM: LUMBAR SPINE - COMPLETE 4+ VIEW  COMPARISON:  None.  FINDINGS: Alignment is anatomic. Vertebral body and disc space height are maintained. No definite pars defects. No endplate degenerative changes.  IMPRESSION: Normal exam.   Electronically Signed   By: Lorin Picket M.D.   On: 10/18/2014 18:49   Dg Sacrum/coccyx  10/18/2014   CLINICAL DATA:  Fall down steps with lumbar back pain and tailbone pain, no radiation of symptoms. Initial encounter.  EXAM: SACRUM AND COCCYX - 2+ VIEW  COMPARISON:  None.  FINDINGS: Sacrum and coccyx appear grossly intact.  IMPRESSION: No acute findings.   Electronically Signed   By: Lorin Picket M.D.   On: 10/18/2014 18:51   Ct Head Wo Contrast  10/18/2014   CLINICAL DATA:  Hallucinations for 1 month.  EXAM: CT HEAD WITHOUT CONTRAST  TECHNIQUE: Contiguous axial images were obtained from the base of the skull through the vertex without intravenous contrast.  COMPARISON:  Head CT scan 10/29/2011.  FINDINGS: The brain appears normal without hemorrhage, infarct, mass lesion, mass effect, midline shift or abnormal extra-axial fluid collection. No hydrocephalus or pneumocephalus. The calvarium is intact. Imaged paranasal sinuses and mastoid air cells are clear.  IMPRESSION: Negative head CT.   Electronically Signed   By: Inge Rise M.D.   On: 10/18/2014 20:29     EKG Interpretation None      MDM   Final diagnoses:  Alcohol intoxication, with delirium  Hyponatremia  Elevated LFTs    Pt with alcohol intoxication, hallucinations, SI. Pt in NAD at this time. Appears intoxicated. Complaining of back and coccyx pain from a fall a week ago. Will get medical screening labs, xrays of lumbar spine and sacrum. Started on CIWA and thiamine ordered.    9:29 PM Pt's  labs showing hyponatremia, elevated LFTs which are unusually high for him. IV fluids ordered. Hepatitis panel ordered. CT head added. Discussed with Dr. Darl Householder, will need medical admission for hyponatremia, worsening liver function, hx of alcohol withdrawal seizures.   Spoke with [facility], will admit. Pt in NAD distress at this time. Normal VS.   Filed Vitals:   10/18/14 1659 10/18/14 1841 10/18/14 1940 10/18/14 2107  BP: 119/72 119/72 116/61 118/69  Pulse: 86 86 87 85  Temp: 97.6 F (36.4 C)  97.4 F (36.3 C)   TempSrc: Oral  Oral   Resp: 18  16 16   SpO2: 94%  92% 91%       Renold Genta, PA-C 10/19/14 0048  Wandra Arthurs, MD 10/19/14 [phone]

## 2014-10-18 NOTE — ED Notes (Signed)
Pt states last alcoholic drink was at 3358.

## 2014-10-19 ENCOUNTER — Inpatient Hospital Stay (HOSPITAL_COMMUNITY): Payer: Medicare Other

## 2014-10-19 DIAGNOSIS — R748 Abnormal levels of other serum enzymes: Secondary | ICD-10-CM

## 2014-10-19 DIAGNOSIS — E43 Unspecified severe protein-calorie malnutrition: Secondary | ICD-10-CM | POA: Insufficient documentation

## 2014-10-19 LAB — BASIC METABOLIC PANEL
Anion gap: 12 (ref 5–15)
Anion gap: 15 (ref 5–15)
Anion gap: 10 (ref 5–15)
BUN: 8 mg/dL (ref 6–23)
BUN: 8 mg/dL (ref 6–23)
BUN: 15 mg/dL (ref 6–23)
CO2: 26 mmol/L (ref 19–32)
CO2: 24 mmol/L (ref 19–32)
CO2: 25 mmol/L (ref 19–32)
Calcium: 8.3 mg/dL — ABNORMAL LOW (ref 8.4–10.5)
Calcium: 8.3 mg/dL — ABNORMAL LOW (ref 8.4–10.5)
Calcium: 9.2 mg/dL (ref 8.4–10.5)
Chloride: 90 mmol/L — ABNORMAL LOW (ref 96–112)
Chloride: 87 mmol/L — ABNORMAL LOW (ref 96–112)
Chloride: 94 mmol/L — ABNORMAL LOW (ref 96–112)
Creatinine, Ser: 0.56 mg/dL (ref 0.50–1.35)
Creatinine, Ser: 0.58 mg/dL (ref 0.50–1.35)
Creatinine, Ser: 0.62 mg/dL (ref 0.50–1.35)
GFR calc Af Amer: >90 mL/min (ref >90)
GFR calc Af Amer: >90 mL/min (ref >90)
GFR calc Af Amer: >90 mL/min (ref >90)
GFR calc non Af Amer: >90 mL/min (ref >90)
GFR calc non Af Amer: >90 mL/min (ref >90)
GFR calc non Af Amer: >90 mL/min (ref >90)
Glucose, Bld: 98 mg/dL (ref 70–99)
Glucose, Bld: 72 mg/dL (ref 70–99)
Glucose, Bld: 102 mg/dL — ABNORMAL HIGH (ref 70–99)
Potassium: 3.2 mmol/L — ABNORMAL LOW (ref 3.5–5.1)
Potassium: 3.5 mmol/L (ref 3.5–5.1)
Potassium: 3.7 mmol/L (ref 3.5–5.1)
Sodium: 128 mmol/L — ABNORMAL LOW (ref 135–145)
Sodium: 126 mmol/L — ABNORMAL LOW (ref 135–145)
Sodium: 129 mmol/L — ABNORMAL LOW (ref 135–145)

## 2014-10-19 LAB — COMPREHENSIVE METABOLIC PANEL
ALT: 277 U/L — ABNORMAL HIGH (ref 0–53)
AST: 369 U/L — ABNORMAL HIGH (ref 0–37)
Albumin: 3.8 g/dL (ref 3.5–5.2)
Alkaline Phosphatase: 106 U/L (ref 39–117)
Anion gap: 11 (ref 5–15)
BUN: 10 mg/dL (ref 6–23)
CO2: 27 mmol/L (ref 19–32)
Calcium: 8.4 mg/dL (ref 8.4–10.5)
Chloride: 87 mmol/L — ABNORMAL LOW (ref 96–112)
Creatinine, Ser: 0.62 mg/dL (ref 0.50–1.35)
GFR calc Af Amer: >90 mL/min (ref >90)
GFR calc non Af Amer: >90 mL/min (ref >90)
Glucose, Bld: 87 mg/dL (ref 70–99)
Potassium: 3.5 mmol/L (ref 3.5–5.1)
Sodium: 125 mmol/L — ABNORMAL LOW (ref 135–145)
Total Bilirubin: 1.1 mg/dL (ref 0.3–1.2)
Total Protein: 6 g/dL (ref 6.0–8.3)

## 2014-10-19 LAB — CBC
HCT: 31.4 % — ABNORMAL LOW (ref 39.0–52.0)
Hemoglobin: 11.2 g/dL — ABNORMAL LOW (ref 13.0–17.0)
MCH: 32.8 pg (ref 26.0–34.0)
MCHC: 35.7 g/dL (ref 30.0–36.0)
MCV: 92.1 fL (ref 78.0–100.0)
Platelets: 95 10*3/uL — ABNORMAL LOW (ref 150–400)
RBC: 3.41 MIL/uL — ABNORMAL LOW (ref 4.22–5.81)
RDW: 14.2 % (ref 11.5–15.5)
WBC: 3.4 10*3/uL — ABNORMAL LOW (ref 4.0–10.5)

## 2014-10-19 LAB — PHOSPHORUS: Phosphorus: 2.9 mg/dL (ref 2.3–4.6)

## 2014-10-19 LAB — MRSA PCR SCREENING: MRSA by PCR: NEGATIVE

## 2014-10-19 LAB — CLOSTRIDIUM DIFFICILE BY PCR: Toxigenic C. Difficile by PCR: NEGATIVE

## 2014-10-19 LAB — HEPATITIS PANEL, ACUTE
HCV Ab: NEGATIVE
Hep A IgM: NONREACTIVE
Hep B C IgM: NONREACTIVE
Hepatitis B Surface Ag: NEGATIVE

## 2014-10-19 LAB — MAGNESIUM: Magnesium: 1.6 mg/dL (ref 1.5–2.5)

## 2014-10-19 LAB — TSH: TSH: 1.406 u[IU]/mL (ref 0.350–4.500)

## 2014-10-19 MED ORDER — ACETAMINOPHEN 650 MG RE SUPP: 650.0000 mg | Freq: Four times a day (QID) | RECTAL | Status: DC | PRN

## 2014-10-19 MED ORDER — POTASSIUM CHLORIDE CRYS ER 20 MEQ PO TBCR
40.0000 meq | EXTENDED_RELEASE_TABLET | Freq: Once | ORAL | Status: AC
  Administered 2014-10-19: 40 meq via ORAL
  Filled 2014-10-19: qty 2

## 2014-10-19 MED ORDER — SERTRALINE HCL 50 MG PO TABS
50.0000 mg | ORAL_TABLET | Freq: Every day | ORAL | Status: DC
  Administered 2014-10-19 – 2014-10-24 (×6): 50 mg via ORAL
  Filled 2014-10-19 (×6): qty 1

## 2014-10-19 MED ORDER — ONDANSETRON HCL 4 MG/2ML IJ SOLN: 4.0000 mg | Freq: Four times a day (QID) | INTRAMUSCULAR | Status: DC | PRN

## 2014-10-19 MED ORDER — LORAZEPAM 2 MG/ML IJ SOLN
2.0000 mg | INTRAMUSCULAR | Status: DC | PRN
  Administered 2014-10-19 – 2014-10-20 (×11): 2 mg via INTRAVENOUS
  Filled 2014-10-19 (×11): qty 1

## 2014-10-19 MED ORDER — QUETIAPINE FUMARATE 200 MG PO TABS
300.0000 mg | ORAL_TABLET | Freq: Every day | ORAL | Status: DC
  Administered 2014-10-19 – 2014-10-23 (×5): 300 mg via ORAL
  Filled 2014-10-19 (×3): qty 1
  Filled 2014-10-19: qty 3
  Filled 2014-10-19 (×5): qty 1

## 2014-10-19 MED ORDER — QUETIAPINE FUMARATE 100 MG PO TABS: 100.0000 mg | ORAL_TABLET | Freq: Two times a day (BID) | ORAL | Status: DC

## 2014-10-19 MED ORDER — GABAPENTIN 300 MG PO CAPS
300.0000 mg | ORAL_CAPSULE | Freq: Every day | ORAL | Status: DC
  Administered 2014-10-19 – 2014-10-23 (×5): 300 mg via ORAL
  Filled 2014-10-19 (×5): qty 1

## 2014-10-19 MED ORDER — ONDANSETRON HCL 4 MG/2ML IJ SOLN: 4.0000 mg | Freq: Three times a day (TID) | INTRAMUSCULAR | Status: DC | PRN

## 2014-10-19 MED ORDER — SODIUM CHLORIDE 0.9 % IJ SOLN
3.0000 mL | Freq: Two times a day (BID) | INTRAMUSCULAR | Status: DC
  Administered 2014-10-19 – 2014-10-24 (×6): 3 mL via INTRAVENOUS

## 2014-10-19 MED ORDER — DOCUSATE SODIUM 100 MG PO CAPS
100.0000 mg | ORAL_CAPSULE | Freq: Two times a day (BID) | ORAL | Status: DC
  Administered 2014-10-19 – 2014-10-24 (×8): 100 mg via ORAL
  Filled 2014-10-19 (×10): qty 1

## 2014-10-19 MED ORDER — ACETAMINOPHEN 325 MG PO TABS: 650.0000 mg | ORAL_TABLET | Freq: Four times a day (QID) | ORAL | Status: DC | PRN

## 2014-10-19 MED ORDER — OXYCODONE HCL 5 MG PO TABS
5.0000 mg | ORAL_TABLET | Freq: Four times a day (QID) | ORAL | Status: DC | PRN
  Administered 2014-10-19 – 2014-10-22 (×8): 5 mg via ORAL
  Filled 2014-10-19 (×8): qty 1

## 2014-10-19 MED ORDER — ENSURE COMPLETE PO LIQD
237.0000 mL | Freq: Two times a day (BID) | ORAL | Status: DC
  Administered 2014-10-20 – 2014-10-24 (×10): 237 mL via ORAL

## 2014-10-19 MED ORDER — POTASSIUM CHLORIDE CRYS ER 20 MEQ PO TBCR
20.0000 meq | EXTENDED_RELEASE_TABLET | Freq: Once | ORAL | Status: AC
  Administered 2014-10-19: 20 meq via ORAL
  Filled 2014-10-19: qty 1

## 2014-10-19 MED ORDER — ONDANSETRON HCL 4 MG PO TABS: 4.0000 mg | ORAL_TABLET | Freq: Four times a day (QID) | ORAL | Status: DC | PRN

## 2014-10-19 MED ORDER — CARBAMAZEPINE 200 MG PO TABS
200.0000 mg | ORAL_TABLET | Freq: Every day | ORAL | Status: DC
  Administered 2014-10-19 – 2014-10-23 (×5): 200 mg via ORAL
  Filled 2014-10-19 (×6): qty 1

## 2014-10-19 MED ORDER — SODIUM CHLORIDE 0.9 % IV SOLN: INTRAVENOUS | Status: AC

## 2014-10-19 MED ORDER — HYDROCODONE-ACETAMINOPHEN 5-325 MG PO TABS: 1.0000 | ORAL_TABLET | ORAL | Status: DC | PRN

## 2014-10-19 MED ORDER — THIAMINE HCL 100 MG/ML IJ SOLN
Freq: Once | INTRAVENOUS | Status: AC
  Administered 2014-10-19: 09:00:00 via INTRAVENOUS
  Filled 2014-10-19: qty 1000

## 2014-10-19 MED ORDER — QUETIAPINE FUMARATE 100 MG PO TABS
100.0000 mg | ORAL_TABLET | Freq: Every day | ORAL | Status: DC
  Administered 2014-10-19 – 2014-10-24 (×6): 100 mg via ORAL
  Filled 2014-10-19 (×6): qty 1

## 2014-10-19 NOTE — Progress Notes (Signed)
Sodium improving, continue same IVF for now and repeat BMP in AM.  Faye Ramsay, MD  [facility] Pager [phone]  If 7PM-7AM, please contact night-coverage [email or web address] Password [email or web address]

## 2014-10-19 NOTE — ED Notes (Signed)
Patient reports that he needs some additional medication.  Found to have severe tremors.  Medicated as documented.

## 2014-10-19 NOTE — Progress Notes (Addendum)
Patient ID: [name], male   DOB: [birth year], 44 y.o.   MRN: [id number]  [facility] PROGRESS NOTE  [name] [id number] DOB: [birth year] DOA: 10/18/2014 PCP: No PCP Per Patient   Brief narrative:    44 y.o. male with depression, psychosis, PTSD, seizures, alcohol abuse, presented to [facility] ED with auditory hallucinations with suicidal ideations. He reported drinking as well and has fell down the stairs (lives on the send floor). In ED, pt noted to have tremors, Na 126, [facility] asked to admit for further evaluation.   Assessment/Plan:    Active Problems:   Alcohol withdrawal - still with tremors this AM - continue to keep on CIWA protocol - MVI, folate and thiamine are being provided as well - advance diet to regular    Hyponatremia  - change IVF to NS and continue hydration as pt still clinically dry on exam - repeat BMP in AM   Hypokalemia - supplement as it still on low end of normal  - mg is WNL    Schizoaffective disorder - will ask for psych consult once pt more medically stable and able to participate    Abnormal liver enzymes - secondary to acute alcoholic hepatitis - continue to trend    Alcohol abuse with physiological dependence - cessation discussed and pt verbalized understanding - will continue daily discussions    Thrombocytopenia, leukopenia - likely from alcohol induced bone marrow damage - no signs of active bleeding  - CBC in AM   Severe PCM - in the setting of heavy alcohol abuse and poor oral intake - allow regular diet    Seizures - no active seizures, continue tegretol   DVT prophylaxis - SCD's  Code Status: Full.  Family Communication:  plan of care discussed with the patient Disposition Plan: Keep in SDU   IV access:  Peripheral IV  Procedures and diagnostic studies:    Dg Lumbar Spine Complete  10/18/2014    Normal exam.     Dg Sacrum/coccyx  10/18/2014  No acute findings.   Ct Head Wo Contrast  10/18/2014   Negative head CT.      Medical Consultants:  None   Other Consultants:  None   IAnti-Infectives:   None   Faye Ramsay, MD  [facility] Pager [phone]  If 7PM-7AM, please contact night-coverage [email or web address] Password [email or web address] 10/19/2014, 8:14 AM   LOS: 1 day   HPI/Subjective: No events overnight.   Objective: Filed Vitals:   10/18/14 2232 10/19/14 0046 10/19/14 0208 10/19/14 0435  BP: 105/51 113/63 131/72 128/73  Pulse: 85 90 119 97  Temp: 97.8 F (36.6 C)  98.1 F (36.7 C) 98.3 F (36.8 C)  TempSrc: Oral  Oral Oral  Resp: 16  22 18   SpO2: 93%  93% 90%    Intake/Output Summary (Last 24 hours) at 10/19/14 2202 Last data filed at 10/19/14 5427  Gross per 24 hour  Intake      0 ml  Output    225 ml  Net   -225 ml    Exam:   General:  Pt is alert, follows commands appropriately, not in acute distress  Cardiovascular: Regular rate and rhythm, S1/S2, no murmurs, no rubs, no gallops  Respiratory: Clear to auscultation bilaterally, no wheezing, no crackles, no rhonchi  Abdomen: Soft, non tender, non distended, bowel sounds present, no guarding  Extremities: No edema, pulses DP and PT palpable bilaterally  Neuro: Tremors noted at rest and with movement   Data Reviewed: Basic Metabolic Panel:  Recent Labs Lab 10/18/14 1759 10/18/14 2143 10/18/14 2324 10/19/14 0404  NA 126*  --  128* 126*  K 3.5  --  3.2* 3.5  CL 82*  --  90* 87*  CO2 25  --  26 24  GLUCOSE 89  --  98 72  BUN 8  --  8 8  CREATININE 0.58  --  0.56 0.58  CALCIUM 8.6  --  8.3* 8.3*  MG  --  2.0  --   --   PHOS  --  2.9  --   --    Liver Function Tests:  Recent Labs Lab 10/18/14 1759  AST 510*  ALT 353*  ALKPHOS 121*  BILITOT 1.0  PROT 6.9  ALBUMIN 4.3    Recent Labs Lab 10/18/14 1800  LIPASE 48    Recent Labs Lab 10/18/14 1813  AMMONIA 29   CBC:  Recent Labs Lab 10/18/14 1845  WBC 3.4*  NEUTROABS 1.9  HGB 12.7*  HCT 35.6*  MCV 91.3  PLT 100*    Scheduled Meds: .  carbamazepine  200 mg Oral QHS  . docusate sodium  100 mg Oral BID  . gabapentin  300 mg Oral QHS  . QUEtiapine  100 mg Oral Daily  . QUEtiapine  300 mg Oral QHS  . sertraline  50 mg Oral Daily  . banana bag IV 1000 mL   Intravenous Once  . sodium chloride  3 mL Intravenous Q12H  . thiamine  100 mg Oral Daily   Or  . thiamine  100 mg Intravenous Daily   Continuous Infusions: . sodium chloride

## 2014-10-19 NOTE — Progress Notes (Signed)
INITIAL NUTRITION ASSESSMENT   DOCUMENTATION CODES Per approved criteria  -Severe malnutrition in the context of chronic illness  Pt meets criteria for severe MALNUTRITION in the context of chronic illness as evidenced by 7.5% wt loss in one month and reported po <75% of estimated needs for >1 month.  INTERVENTION: - Ensure Complete po BID, each supplement provides 350 kcal and 13 grams of protein - Multivitamin with minerals daily  NUTRITION DIAGNOSIS: Inadequate oral intake related to ETOH abuse as evidenced by 15 lb wt loss in <1 month.   Goal: Pt to meet >/= 90% of their estimated nutrition needs   Monitor:  Weight trend, acceptance of supplements, labs  Reason for Assessment: Malnutrition Screening Tool  44 y.o. male  Admitting Dx: <principal problem not specified>  ASSESSMENT: Presented with  Patient with hx of Alcohol abuse and schizoaffective disorder have had auditory hallucinations chronically but recently he started to have them non-stop. He has had command hallucinations telling him to kill himself. He has not gone back to [place] for few months. His alcohol intake has increased. He has been binge drinking fo rthe past 1 month. Hx of alcohol withdrawal with seizures. Last drink was 10/18/14 at 3 PM feels tremulous now. In ER Na noted 126. Had a recent fall. CT head unremarkable. Patient reports drinking all day and takes very little otherwise by mouth.   - Pt with poor po prior to admission due to ETOH abuse. He reports a 15 lb wt loss in the past month. Since admission, pt has been eating well. Prior to being brought to floor, pt ate several sandwiches in ED. He ordered lunch today and said that his appetite was improving. Agreed to try nutritional supplements.  - No signs of fat or muscle wasting.  - Labs reviewed Na low  Height: Ht Readings from Last 1 Encounters:  05/04/14 6' (1.829 m)    Weight: Wt Readings from Last 1 Encounters:  05/04/14 185 lb (83.915 kg)     Ideal Body Weight: 178 lbs  % Ideal Body Weight: 104%  Wt Readings from Last 10 Encounters:  05/04/14 185 lb (83.915 kg)  02/09/14 182 lb (82.555 kg)  12/29/13 200 lb (90.719 kg)  12/13/13 210 lb (95.255 kg)  10/30/11 220 lb (99.791 kg)  07/03/11 209 lb 7 oz (95 kg)    Usual Body Weight: 200 lbs  % Usual Body Weight: 93%  BMI:  There is no weight on file to calculate BMI.  Estimated Nutritional Needs: Kcal: 2100-2300 Protein: 125-135 g Fluid: 2/3 L/day  Skin: intact  Diet Order: Diet regular  EDUCATION NEEDS: -Education needs addressed   Intake/Output Summary (Last 24 hours) at 10/19/14 1451 Last data filed at 10/19/14 1300  Gross per 24 hour  Intake  420.8 ml  Output    225 ml  Net  195.8 ml    Last BM: prior to admission   Labs:   Recent Labs Lab 10/18/14 2143 10/18/14 2324 10/19/14 0404 10/19/14 0800  NA  --  128* 126* 125*  K  --  3.2* 3.5 3.5  CL  --  90* 87* 87*  CO2  --  26 24 27   BUN  --  8 8 10   CREATININE  --  0.56 0.58 0.62  CALCIUM  --  8.3* 8.3* 8.4  MG 2.0  --   --  1.6  PHOS 2.9  --   --  2.9  GLUCOSE  --  98 72 87  CBG (last 3)  No results for input(s): GLUCAP in the last 72 hours.  Scheduled Meds: . carbamazepine  200 mg Oral QHS  . docusate sodium  100 mg Oral BID  . gabapentin  300 mg Oral QHS  . QUEtiapine  100 mg Oral Daily  . QUEtiapine  300 mg Oral QHS  . sertraline  50 mg Oral Daily  . sodium chloride  3 mL Intravenous Q12H  . thiamine  100 mg Oral Daily   Or  . thiamine  100 mg Intravenous Daily    Continuous Infusions: . sodium chloride      Past Medical History  Diagnosis Date  . Depression   . Psychosis   . PTSD (post-traumatic stress disorder)   . Seizure disorder     related to etoh seizure  . Alcohol abuse   . Schizoaffective disorder   . Anxiety     Past Surgical History  Procedure Laterality Date  . Foot surgery      right  . Hand surgery      Laurette Schimke [place], [place],  [place]

## 2014-10-19 NOTE — Care Management Note (Signed)
CARE MANAGEMENT NOTE 10/19/2014  Patient:  [name], [name]   Account Number:  [id number]  Date Initiated:  10/19/2014  Documentation initiated by:  DAVIS,RHONDA  Subjective/Objective Assessment:   Alcohol abuse and schizoaffective disorder have had auditory hallucinations chronically but recently he started to have them non-stop. He has had command hallucinations telling him to kill himself.     Action/Plan:   has been at [facility] in the past will need psych eval for placement or next steps to care   Anticipated DC Date:  10/22/2014   Anticipated DC Plan:  [place] referral  Clinical Social Worker      [place]  CM consult      [facility] Choice  NA   Choice offered to / List presented to:  NA   DME arranged  NA      DME agency  NA     [place] arranged  NA      [place] agency  NA   Status of service:  In process, will continue to follow Medicare Important Message given?   (If response is "NO", the following Medicare IM given date fields will be blank) Date Medicare IM given:   Medicare IM given by:   Date Additional Medicare IM given:   Additional Medicare IM given by:    Discharge Disposition:    Per UR Regulation:  Reviewed for med. necessity/level of care/duration of stay  If discussed at [place] of Stay Meetings, dates discussed:    Comments:  October 19, 2014/Rhonda L. Rosana Hoes, RN, BSN, CCM. Case Management [place] [phone] No discharge needs present of time of review.

## 2014-10-19 NOTE — ED Notes (Signed)
Pt changed into scrubs. Pt seen and wanded by security.  Items documented.

## 2014-10-20 LAB — CBC
HCT: 31.8 % — ABNORMAL LOW (ref 39.0–52.0)
Hemoglobin: 10.9 g/dL — ABNORMAL LOW (ref 13.0–17.0)
MCH: 32.4 pg (ref 26.0–34.0)
MCHC: 34.3 g/dL (ref 30.0–36.0)
MCV: 94.6 fL (ref 78.0–100.0)
Platelets: 87 10*3/uL — ABNORMAL LOW (ref 150–400)
RBC: 3.36 MIL/uL — ABNORMAL LOW (ref 4.22–5.81)
RDW: 14.6 % (ref 11.5–15.5)
WBC: 2.8 10*3/uL — ABNORMAL LOW (ref 4.0–10.5)

## 2014-10-20 LAB — COMPREHENSIVE METABOLIC PANEL
ALT: 253 U/L — ABNORMAL HIGH (ref 0–53)
AST: 335 U/L — ABNORMAL HIGH (ref 0–37)
Albumin: 3.8 g/dL (ref 3.5–5.2)
Alkaline Phosphatase: 103 U/L (ref 39–117)
Anion gap: 9 (ref 5–15)
BUN: 12 mg/dL (ref 6–23)
CO2: 25 mmol/L (ref 19–32)
Calcium: 9.1 mg/dL (ref 8.4–10.5)
Chloride: 98 mmol/L (ref 96–112)
Creatinine, Ser: 0.66 mg/dL (ref 0.50–1.35)
GFR calc Af Amer: >90 mL/min (ref >90)
GFR calc non Af Amer: >90 mL/min (ref >90)
Glucose, Bld: 90 mg/dL (ref 70–99)
Potassium: 3.6 mmol/L (ref 3.5–5.1)
Sodium: 132 mmol/L — ABNORMAL LOW (ref 135–145)
Total Bilirubin: 1.1 mg/dL (ref 0.3–1.2)
Total Protein: 6.1 g/dL (ref 6.0–8.3)

## 2014-10-20 LAB — OSMOLALITY, URINE: Osmolality, Ur: 674 mOsm/kg (ref 390–1090)

## 2014-10-20 LAB — SODIUM, URINE, RANDOM: Sodium, Ur: 174 mEq/L

## 2014-10-20 LAB — CREATININE, URINE, RANDOM: Creatinine, Urine: 161.6 mg/dL

## 2014-10-20 MED ORDER — ADULT MULTIVITAMIN W/MINERALS CH
1.0000 | ORAL_TABLET | Freq: Every day | ORAL | Status: DC
  Administered 2014-10-20 – 2014-10-24 (×5): 1 via ORAL
  Filled 2014-10-20 (×5): qty 1

## 2014-10-20 MED ORDER — METOPROLOL TARTRATE 1 MG/ML IV SOLN: 5.0000 mg | Freq: Four times a day (QID) | INTRAVENOUS | Status: DC | PRN

## 2014-10-20 MED ORDER — THIAMINE HCL 100 MG/ML IJ SOLN: 100.0000 mg | Freq: Every day | INTRAMUSCULAR | Status: DC

## 2014-10-20 MED ORDER — LORAZEPAM 1 MG PO TABS
1.0000 mg | ORAL_TABLET | Freq: Four times a day (QID) | ORAL | Status: DC | PRN
  Administered 2014-10-21 (×2): 1 mg via ORAL
  Filled 2014-10-20 (×2): qty 1

## 2014-10-20 MED ORDER — MAGNESIUM SULFATE 2 GM/50ML IV SOLN
2.0000 g | Freq: Once | INTRAVENOUS | Status: AC
  Administered 2014-10-20: 2 g via INTRAVENOUS
  Filled 2014-10-20: qty 50

## 2014-10-20 MED ORDER — LORAZEPAM 2 MG/ML IJ SOLN
1.0000 mg | Freq: Four times a day (QID) | INTRAMUSCULAR | Status: DC | PRN
  Administered 2014-10-20 – 2014-10-22 (×4): 1 mg via INTRAVENOUS
  Filled 2014-10-20 (×4): qty 1

## 2014-10-20 MED ORDER — FOLIC ACID 1 MG PO TABS
1.0000 mg | ORAL_TABLET | Freq: Every day | ORAL | Status: DC
  Administered 2014-10-20 – 2014-10-24 (×5): 1 mg via ORAL
  Filled 2014-10-20 (×5): qty 1

## 2014-10-20 MED ORDER — VITAMIN B-1 100 MG PO TABS: 100.0000 mg | ORAL_TABLET | Freq: Every day | ORAL | Status: DC

## 2014-10-20 MED ORDER — POTASSIUM CHLORIDE CRYS ER 20 MEQ PO TBCR
40.0000 meq | EXTENDED_RELEASE_TABLET | Freq: Once | ORAL | Status: AC
  Administered 2014-10-20: 40 meq via ORAL
  Filled 2014-10-20: qty 2

## 2014-10-20 NOTE — Progress Notes (Signed)
Patient ID: [name], male   DOB: 08/13/1971, 44 y.o.   MRN: [id number]  [facility] PROGRESS NOTE  [name] [id number] DOB: [birth year] DOA: 10/18/2014 PCP: No PCP Per Patient   Brief narrative:    44 y.o. male with depression, psychosis, PTSD, seizures, alcohol abuse, presented to [facility] ED with auditory hallucinations with suicidal ideations. He reported drinking as well and has fell down the stairs (lives on the send floor). In ED, pt noted to have tremors, Na 126, [facility] asked to admit for further evaluation.   Assessment/Plan:    Active Problems:  Alcohol withdrawal - still with tremors this AM - continue to keep on CIWA protocol - MVI, folate and thiamine are being provided as well - stable for transfer to telemetry bed   Hyponatremia  - Na improving, 132 this AM - continue IVF and repeat BMP in AM  Hypokalemia - supplement as it still on low end of normal  - mg is on low end of normal so will supplement   Schizoaffective disorder - psych consult requested   Abnormal liver enzymes - secondary to acute alcoholic hepatitis - slightly better - continue to trend   Alcohol abuse with physiological dependence - cessation discussed and pt verbalized understanding - will continue daily discussions   Pancytopenia   - likely from alcohol induced bone marrow damage - no signs of active bleeding  - CBC in AM  Severe PCM - in the setting of heavy alcohol abuse and poor oral intake - tolerating regular diet well   Seizures - no active seizures, continue tegretol   DVT prophylaxis - SCD's  Code Status: Full.  Family Communication: plan of care discussed with the patient Disposition Plan: Transfer to telemetry bed   IV access:  Peripheral IV  Procedures and diagnostic studies:    None  Medical Consultants:  None  Other Consultants:  None  IAnti-Infectives:   None  Faye Ramsay, MD  [facility] Pager [phone]  If 7PM-7AM, please  contact night-coverage [email or web address] Password [email or web address] 10/20/2014, 8:10 AM   LOS: 2 days   HPI/Subjective: No events overnight.   Objective: Filed Vitals:   10/20/14 0100 10/20/14 0200 10/20/14 0300 10/20/14 0400  BP: 110/77 115/74 117/71 113/74  Pulse: 104 102 98 92  Temp:    98.6 F (37 C)  TempSrc:    Oral  Resp: 15 15 14 16   SpO2: 93% 93% 94% 92%    Intake/Output Summary (Last 24 hours) at 10/20/14 0810 Last data filed at 10/20/14 0430  Gross per 24 hour  Intake   1353 ml  Output    775 ml  Net    578 ml    Exam:   General:  Pt is alert, follows commands appropriately, not in acute distress  Cardiovascular: Regular rate and rhythm, S1/S2, no murmurs, no rubs, no gallops  Respiratory: Clear to auscultation bilaterally, no wheezing, no crackles, no rhonchi  Abdomen: Soft, non tender, non distended, bowel sounds present, no guarding  Extremities: No edema, pulses DP and PT palpable bilaterally  Neuro: Tremors at rest and with movement   Data Reviewed: Basic Metabolic Panel:  Recent Labs Lab 10/18/14 2143 10/18/14 2324 10/19/14 0404 10/19/14 0800 10/19/14 1735 10/20/14 0337  NA  --  128* 126* 125* 129* 132*  K  --  3.2* 3.5 3.5 3.7 3.6  CL  --  90* 87* 87* 94* 98  CO2  --  26 24 27 25 25   GLUCOSE  --  98  72 87 102* 90  BUN  --  8 8 10 15 12   CREATININE  --  0.56 0.58 0.62 0.62 0.66  CALCIUM  --  8.3* 8.3* 8.4 9.2 9.1  MG 2.0  --   --  1.6  --   --   PHOS 2.9  --   --  2.9  --   --    Liver Function Tests:  Recent Labs Lab 10/18/14 1759 10/19/14 0800 10/20/14 0337  AST 510* 369* 335*  ALT 353* 277* 253*  ALKPHOS 121* 106 103  BILITOT 1.0 1.1 1.1  PROT 6.9 6.0 6.1  ALBUMIN 4.3 3.8 3.8    Recent Labs Lab 10/18/14 1800  LIPASE 48    Recent Labs Lab 10/18/14 1813  AMMONIA 29   CBC:  Recent Labs Lab 10/18/14 1845 10/19/14 0800 10/20/14 0337  WBC 3.4* 3.4* 2.8*  NEUTROABS 1.9  --   --   HGB 12.7* 11.2* 10.9*  HCT 35.6* 31.4*  31.8*  MCV 91.3 92.1 94.6  PLT 100* 95* 87*    Recent Results (from the past 240 hour(s))  MRSA PCR Screening     Status: None   Collection Time: 10/19/14  7:00 AM  Result Value Ref Range Status   MRSA by PCR NEGATIVE NEGATIVE Final    Comment:        The GeneXpert MRSA Assay (FDA approved for NASAL specimens only), is one component of a comprehensive MRSA colonization surveillance program. It is not intended to diagnose MRSA infection nor to guide or monitor treatment for MRSA infections.   Clostridium Difficile by PCR     Status: None   Collection Time: 10/19/14  7:45 PM  Result Value Ref Range Status   C difficile by pcr NEGATIVE NEGATIVE Final     Scheduled Meds: . carbamazepine  200 mg Oral QHS  . docusate sodium  100 mg Oral BID  . feeding supplement (ENSURE COMPLETE)  237 mL Oral BID BM  . gabapentin  300 mg Oral QHS  . QUEtiapine  100 mg Oral Daily  . QUEtiapine  300 mg Oral QHS  . sertraline  50 mg Oral Daily  . sodium chloride  3 mL Intravenous Q12H  . thiamine  100 mg Oral Daily   Or  . thiamine  100 mg Intravenous Daily   Continuous Infusions:

## 2014-10-21 LAB — COMPREHENSIVE METABOLIC PANEL
ALT: 319 U/L — ABNORMAL HIGH (ref 0–53)
AST: 424 U/L — ABNORMAL HIGH (ref 0–37)
Albumin: 3.7 g/dL (ref 3.5–5.2)
Alkaline Phosphatase: 111 U/L (ref 39–117)
Anion gap: 7 (ref 5–15)
BUN: 8 mg/dL (ref 6–23)
CO2: 25 mmol/L (ref 19–32)
Calcium: 8.8 mg/dL (ref 8.4–10.5)
Chloride: 99 mmol/L (ref 96–112)
Creatinine, Ser: 0.63 mg/dL (ref 0.50–1.35)
GFR calc Af Amer: >90 mL/min (ref >90)
GFR calc non Af Amer: >90 mL/min (ref >90)
Glucose, Bld: 99 mg/dL (ref 70–99)
Potassium: 3.9 mmol/L (ref 3.5–5.1)
Sodium: 131 mmol/L — ABNORMAL LOW (ref 135–145)
Total Bilirubin: 1 mg/dL (ref 0.3–1.2)
Total Protein: 6.3 g/dL (ref 6.0–8.3)

## 2014-10-21 LAB — CBC
HCT: 31.8 % — ABNORMAL LOW (ref 39.0–52.0)
Hemoglobin: 11 g/dL — ABNORMAL LOW (ref 13.0–17.0)
MCH: 33 pg (ref 26.0–34.0)
MCHC: 34.6 g/dL (ref 30.0–36.0)
MCV: 95.5 fL (ref 78.0–100.0)
Platelets: 92 10*3/uL — ABNORMAL LOW (ref 150–400)
RBC: 3.33 MIL/uL — ABNORMAL LOW (ref 4.22–5.81)
RDW: 14.6 % (ref 11.5–15.5)
WBC: 3.4 10*3/uL — ABNORMAL LOW (ref 4.0–10.5)

## 2014-10-21 LAB — MAGNESIUM: Magnesium: 1.8 mg/dL (ref 1.5–2.5)

## 2014-10-21 NOTE — Progress Notes (Addendum)
Patient ID: [name], male   DOB: 26-Jul-1971, 44 y.o.   MRN: [id number]  [facility] PROGRESS NOTE  Kainan Patty IPJ:[id number] DOB: 1971-03-11 DOA: 10/18/2014 PCP: No PCP Per Patient   Brief narrative:    44 y.o. male with depression, psychosis, PTSD, seizures, alcohol abuse, presented to [facility] ED with auditory hallucinations with suicidal ideations. He reported drinking as well and has fell down the stairs (lives on the send floor). In ED, pt noted to have tremors, Na 126, [facility] asked to admit for further evaluation.   Assessment/Plan:    Active Problems:  Alcohol withdrawal - still with tremors this AM but better, no events overnight  - continue to keep on CIWA protocol - MVI, folate and thiamine are being provided as well  Hyponatremia  - Na improving, 131 - 132  - continue IVF and repeat BMP in AM  Hypokalemia - supplemented and WNL this AM   Schizoaffective disorder - psych consult   Abnormal liver enzymes - secondary to acute alcoholic hepatitis - trending up - continue to trend   Alcohol abuse with physiological dependence - cessation discussed and pt verbalized understanding - will continue daily discussions   Pancytopenia   - likely from alcohol induced bone marrow damage - no signs of active bleeding, blood counts overall stable  - CBC in AM  Severe PCM - in the setting of heavy alcohol abuse and poor oral intake - tolerating regular diet well    Back pain - ambulate as pt able to tolerate   Seizures - no active seizures, continue tegretol   DVT prophylaxis - SCD's  Code Status: Full.  Family Communication: plan of care discussed with the patient Disposition Plan: Requires inpatient monitoring   IV access:  Peripheral IV  Procedures and diagnostic studies:    Dg Lumbar Spine Complete  10/18/2014   Normal exam.    Dg Sacrum/coccyx  10/18/2014  No acute findings.     Ct Head Wo Contrast  10/18/2014   Negative head CT.    US Abdomen  Limited Ruq  10/19/2014  Increased liver echogenicity, a finding most likely indicative of hepatic steatosis. While no focal liver lesions are identified, it must be cautioned that the sensitivity of ultrasound for focal liver lesions is diminished in this circumstance.  Medical Consultants:  None  Other Consultants:  None  IAnti-Infectives:   None  Faye Ramsay, MD  [facility] Pager [phone]  If 7PM-7AM, please contact night-coverage [email or web address] Password [email or web address] 10/21/2014, 1:54 PM   LOS: 3 days   HPI/Subjective: No events overnight.   Objective: Filed Vitals:   10/20/14 1509 10/20/14 2140 10/21/14 0540 10/21/14 1335  BP: 120/78 128/83 119/87 112/75  Pulse: 122 108 97 103  Temp: 97.6 F (36.4 C) 98.1 F (36.7 C) 97.5 F (36.4 C) 97.7 F (36.5 C)  TempSrc: Oral Oral Oral Oral  Resp: 20 20 20 18   SpO2: 96% 97% 97% 96%    Intake/Output Summary (Last 24 hours) at 10/21/14 1354 Last data filed at 10/21/14 1300  Gross per 24 hour  Intake    520 ml  Output   2250 ml  Net  -1730 ml    Exam:   General:  Pt is alert, follows commands appropriately, not in acute distress  Cardiovascular: Regular rhythm, tachycardic, S1/S2, no murmurs, no rubs, no gallops  Respiratory: Clear to auscultation bilaterally, no wheezing, no crackles, no rhonchi  Abdomen: Soft, tender in upper abd quadrants, non distended, bowel sounds present, no guarding  Extremities: No edema, pulses DP and PT palpable bilaterally  Neuro: Tremors at rest and with movement   Data Reviewed: Basic Metabolic Panel:  Recent Labs Lab 10/18/14 2143  10/19/14 0404 10/19/14 0800 10/19/14 1735 10/20/14 0337 10/21/14 0534  NA  --   < > 126* 125* 129* 132* 131*  K  --   < > 3.5 3.5 3.7 3.6 3.9  CL  --   < > 87* 87* 94* 98 99  CO2  --   < > 24 27 25 25 25   GLUCOSE  --   < > 72 87 102* 90 99  BUN  --   < > 8 10 15 12 8   CREATININE  --   < > 0.58 0.62 0.62 0.66 0.63  CALCIUM  --   < > 8.3* 8.4 9.2 9.1  8.8  MG 2.0  --   --  1.6  --   --  1.8  PHOS 2.9  --   --  2.9  --   --   --   < > = values in this interval not displayed. Liver Function Tests:  Recent Labs Lab 10/18/14 1759 10/19/14 0800 10/20/14 0337 10/21/14 0534  AST 510* 369* 335* 424*  ALT 353* 277* 253* 319*  ALKPHOS 121* 106 103 111  BILITOT 1.0 1.1 1.1 1.0  PROT 6.9 6.0 6.1 6.3  ALBUMIN 4.3 3.8 3.8 3.7    Recent Labs Lab 10/18/14 1800  LIPASE 48    Recent Labs Lab 10/18/14 1813  AMMONIA 29   CBC:  Recent Labs Lab 10/18/14 1845 10/19/14 0800 10/20/14 0337 10/21/14 0534  WBC 3.4* 3.4* 2.8* 3.4*  NEUTROABS 1.9  --   --   --   HGB 12.7* 11.2* 10.9* 11.0*  HCT 35.6* 31.4* 31.8* 31.8*  MCV 91.3 92.1 94.6 95.5  PLT 100* 95* 87* 92*    Recent Results (from the past 240 hour(s))  MRSA PCR Screening     Status: None   Collection Time: 10/19/14  7:00 AM  Result Value Ref Range Status   MRSA by PCR NEGATIVE NEGATIVE Final    Comment:        The GeneXpert MRSA Assay (FDA approved for NASAL specimens only), is one component of a comprehensive MRSA colonization surveillance program. It is not intended to diagnose MRSA infection nor to guide or monitor treatment for MRSA infections.   Clostridium Difficile by PCR     Status: None   Collection Time: 10/19/14  7:45 PM  Result Value Ref Range Status   C difficile by pcr NEGATIVE NEGATIVE Final     Scheduled Meds: . carbamazepine  200 mg Oral QHS  . docusate sodium  100 mg Oral BID  . feeding supplement (ENSURE COMPLETE)  237 mL Oral BID BM  . folic acid  1 mg Oral Daily  . gabapentin  300 mg Oral QHS  . multivitamin with minerals  1 tablet Oral Daily  . QUEtiapine  100 mg Oral Daily  . QUEtiapine  300 mg Oral QHS  . sertraline  50 mg Oral Daily  . sodium chloride  3 mL Intravenous Q12H  . thiamine  100 mg Oral Daily   Continuous Infusions:

## 2014-10-21 NOTE — Clinical Social Work Psychosocial (Signed)
Clinical Social Work Department BRIEF PSYCHOSOCIAL ASSESSMENT 10/21/2014  Patient:  [name], [name]     Account Number:  [id number]     Admit date:  10/18/2014  Clinical Social Worker:  Dede Query, CLINICAL SOCIAL WORKER  Date/Time:  10/21/2014 04:31 PM  Referred by:  Physician  Date Referred:  10/21/2014 Referred for  Substance Abuse   Other Referral:   Alcohol abuse   Interview type:  Patient Other interview type:    PSYCHOSOCIAL DATA Living Status:  ALONE Admitted from facility:   Level of care:   Primary support name:  Peggy Primary support relationship to patient:  SIBLING Degree of support available:   high/ pt states sister is close and supportive    CURRENT CONCERNS  Other Concerns:    SOCIAL WORK ASSESSMENT / PLAN CSW prompted pt to discuss history and treatment services.  CSW prompted pt to explore thoughts and feelings related to his alcohol use/abuse.  CSW provided active and supportive listening.  CSW provided a list of providers that will take pt's medicare in his community.  CSW provided information and an application for [facility] services to help get pt to his medical/treatment appointments.    Assessment/plan status:  Referral to [employer] Other assessment/ plan:   Provided [facility] information and application packet.  Provided a list of counselors that take pt's insurance in his community.   Information/referral to community resources:    PATIENT'S/FAMILY'S RESPONSE TO PLAN OF CARE: Pt discussed living in [place] for past 4 years.  Pt discussed being on disability and not currently working. Pt discussed having 2 siblings that live in town and are supportive to him. Pt discussed having past alcohol dependence problems and history of one rehab for alcohol at [facility] 3 and half years ago.  Pt stated that he has been lax with his medical appointments because he can no longer use his scooter with the new laws put in place this year. Pt stated that he is  divorced and has one daughter age 66 in [place].  Pt stated that he had a fiance who killed herself with a gun in front of him and that led him to take a bottle of ativan and bottle of vodka 6 years ago. Pt stated that was the only time to attempted to hurt himself. Pt stated that he currently has a sitter because he had suicidal thoughts.  Pt stated that he "never had any suicidal plans just thoughts" but he does not have these anymore.  Pt stated that he has one friend in town that is a bad influence so he is staying away from him.  Pt stated that his mother is coming into town to stay with him for awhile when he is discharged.  Pt stated that he plays guitar to help release  his feelings. Pt stated that he goes to [facility] for his psychotropic medications and he also attended counseling there.  Pt stated that he does not follow through with his counseling and he does not know why.   Pt stated that he does "not like rehab" but would not say why.  Pt open to counseling but not alcohol related counseling.  Dede Query, LCSW [place] Worker - Weekend Coverage cell #: [phone]    .Dede Query, LCSW [facility] - Closed Clinical Social Worker - Weekend Coverage cell #: [phone]

## 2014-10-22 DIAGNOSIS — R45851 Suicidal ideations: Secondary | ICD-10-CM

## 2014-10-22 DIAGNOSIS — F10229 Alcohol dependence with intoxication, unspecified: Secondary | ICD-10-CM

## 2014-10-22 DIAGNOSIS — F251 Schizoaffective disorder, depressive type: Secondary | ICD-10-CM

## 2014-10-22 LAB — COMPREHENSIVE METABOLIC PANEL
ALT: 289 U/L — ABNORMAL HIGH (ref 0–53)
AST: 267 U/L — ABNORMAL HIGH (ref 0–37)
Albumin: 3.8 g/dL (ref 3.5–5.2)
Alkaline Phosphatase: 102 U/L (ref 39–117)
Anion gap: 8 (ref 5–15)
BUN: 12 mg/dL (ref 6–23)
CO2: 22 mmol/L (ref 19–32)
Calcium: 9.2 mg/dL (ref 8.4–10.5)
Chloride: 104 mmol/L (ref 96–112)
Creatinine, Ser: 0.7 mg/dL (ref 0.50–1.35)
GFR calc Af Amer: >90 mL/min (ref >90)
GFR calc non Af Amer: >90 mL/min (ref >90)
Glucose, Bld: 107 mg/dL — ABNORMAL HIGH (ref 70–99)
Potassium: 4.3 mmol/L (ref 3.5–5.1)
Sodium: 134 mmol/L — ABNORMAL LOW (ref 135–145)
Total Bilirubin: 0.7 mg/dL (ref 0.3–1.2)
Total Protein: 6.3 g/dL (ref 6.0–8.3)

## 2014-10-22 LAB — CBC
HCT: 33.7 % — ABNORMAL LOW (ref 39.0–52.0)
Hemoglobin: 11.3 g/dL — ABNORMAL LOW (ref 13.0–17.0)
MCH: 32.2 pg (ref 26.0–34.0)
MCHC: 33.5 g/dL (ref 30.0–36.0)
MCV: 96 fL (ref 78.0–100.0)
Platelets: 123 10*3/uL — ABNORMAL LOW (ref 150–400)
RBC: 3.51 MIL/uL — ABNORMAL LOW (ref 4.22–5.81)
RDW: 14.9 % (ref 11.5–15.5)
WBC: 4 10*3/uL (ref 4.0–10.5)

## 2014-10-22 MED ORDER — LORAZEPAM 1 MG PO TABS
1.0000 mg | ORAL_TABLET | ORAL | Status: DC | PRN
Start: 2014-10-22 — End: 2014-10-24
  Administered 2014-10-22 – 2014-10-24 (×10): 1 mg via ORAL
  Filled 2014-10-22 (×10): qty 1

## 2014-10-22 MED ORDER — LORAZEPAM 2 MG/ML IJ SOLN
1.0000 mg | INTRAMUSCULAR | Status: DC | PRN
  Administered 2014-10-22 – 2014-10-24 (×3): 1 mg via INTRAVENOUS
  Filled 2014-10-22 (×3): qty 1

## 2014-10-22 MED ORDER — NICOTINE 21 MG/24HR TD PT24
21.0000 mg | MEDICATED_PATCH | Freq: Every day | TRANSDERMAL | Status: DC
Start: 2014-10-22 — End: 2014-10-24
  Administered 2014-10-22 – 2014-10-24 (×3): 21 mg via TRANSDERMAL
  Filled 2014-10-22 (×3): qty 1

## 2014-10-22 MED ORDER — HYDROMORPHONE HCL 1 MG/ML IJ SOLN
0.5000 mg | INTRAMUSCULAR | Status: DC | PRN
  Administered 2014-10-22 – 2014-10-24 (×13): 0.5 mg via INTRAVENOUS
  Filled 2014-10-22 (×13): qty 1

## 2014-10-22 NOTE — Evaluation (Signed)
Physical Therapy Evaluation Patient Details Name: [name] MRN: [id number] DOB: 04-13-1971 Today's Date: 10/22/2014   History of Present Illness  44 yo male with abnormal liver enzymes and back pain was referred for PT with pending psych eval and history of schizoaffective disorder and depression, PTSD.  Clinical Impression  Pt was seen for assessment of his physical function after admission with fall on stairs at home.  He has some tremors with mobility but could walk with extra time on RW and no buckling noted.  Will follow him to assess his progression of safe independent gait.    Follow Up Recommendations Home health PT;Supervision/Assistance - 24 hour    Equipment Recommendations  Rolling walker with 5" wheels    Recommendations for Other Services       Precautions / Restrictions Precautions Precautions: Fall Restrictions Weight Bearing Restrictions: No      Mobility  Bed Mobility Overal bed mobility: Modified Independent             General bed mobility comments: awkard transition but could get up alone  Transfers Overall transfer level: Needs assistance Equipment used: Rolling walker (2 wheeled) Transfers: Sit to/from [facility] Sit to Stand: Min guard;Min assist Stand pivot transfers: Supervision;Min guard       General transfer comment: pt has some tremors in LE's with active movement and the supervision was to ensure balance maintained  Ambulation/Gait Ambulation/Gait assistance: Min guard;Min assist Ambulation Distance (Feet): 20 Feet Assistive device: Rolling walker (2 wheeled) Gait Pattern/deviations: Step-through pattern;Decreased step length - left;Decreased step length - right;Decreased stride length;Decreased dorsiflexion - right;Decreased dorsiflexion - left;Shuffle;Wide base of support Gait velocity: reduced and halting Gait velocity interpretation: Below normal speed for age/gender General Gait Details: pt is moving  slowly and tremors are causing him to hesitate with steps  Stairs            Wheelchair Mobility    Modified Rankin (Stroke Patients Only)       Balance Overall balance assessment: Needs assistance Sitting-balance support: Feet supported Sitting balance-Leahy Scale: Good   Postural control: Posterior lean Standing balance support: Bilateral upper extremity supported Standing balance-Leahy Scale: Fair Standing balance comment: fair- dynamic standing                             Pertinent Vitals/Pain Pain Assessment: Faces Pain Score: 3  Pain Location: low back Pain Intervention(s): Limited activity within patient's tolerance;Monitored during session;Premedicated before session    [place] expects to be discharged to:: Private residence Living Arrangements: Alone Available Help at Discharge: Family;Available PRN/intermittently Type of Home: Apartment Home Access: Stairs to enter Entrance Stairs-Rails: Left Entrance Stairs-Number of Steps: 13 Home Layout: One level Home Equipment: None      Prior Function Level of Independence: Independent               Hand Dominance        Extremity/Trunk Assessment   Upper Extremity Assessment: Overall WFL for tasks assessed           Lower Extremity Assessment: Overall WFL for tasks assessed (hips are 4+ strength)      Cervical / Trunk Assessment: Normal  Communication   Communication: No difficulties  Cognition Arousal/Alertness: Awake/alert Behavior During Therapy: Anxious Overall Cognitive Status: Within Functional Limits for tasks assessed                      General Comments  General comments (skin integrity, edema, etc.): Pt is quiet and seems very thoughtful with all questions.  He is planing to go home but sustained a fall on the steps to his apt just prior to his admission    Exercises Other Exercises Other Exercises: Strength measures for LE's reveal  generally 4+ hip strength and Grossly WFL otherwise       Assessment/Plan    PT Assessment Patient needs continued PT services  PT Diagnosis Abnormality of gait   PT Problem List    PT Treatment Interventions DME instruction;Gait training;Stair training;Functional mobility training;Therapeutic activities;Therapeutic exercise;Balance training;Neuromuscular re-education;Patient/family education   PT Goals (Current goals can be found in the Care Plan section) Acute Rehab PT Goals Patient Stated Goal: to be safe at home PT Goal Formulation: With patient Time For Goal Achievement: 11/05/14 Potential to Achieve Goals: Good    Frequency Min 3X/week   Barriers to discharge Inaccessible home environment;Decreased caregiver support      Co-evaluation               End of Session Equipment Utilized During Treatment: Gait belt Activity Tolerance: Patient tolerated treatment well Patient left: in chair;with call bell/phone within reach Nurse Communication: Mobility status;Other (comment) (request for ginger ale)         Time: 8756-4332 PT Time Calculation (min) (ACUTE ONLY): 25 min   Charges:   PT Evaluation $Initial PT Evaluation Tier I: 1 Procedure PT Treatments $Gait Training: 8-22 mins   PT G Codes:        Ramond Dial 2014-11-11, 10:38 AM   Mee Hives, PT MS Acute Rehab Dept. Number: [phone]

## 2014-10-22 NOTE — Consult Note (Signed)
[facility] Consult   Reason for Consult:  Psychosis and suicidal thoughts Referring Physician:  Dr. Doyle Askew Patient Identification: [name] MRN:  [id number] Principal Diagnosis: Alcohol dependence, schizoaffective disorder Diagnosis:   Patient Active Problem List   Diagnosis Date Noted  . Protein-calorie malnutrition, severe [E43] 10/19/2014  . Hyponatremia [E87.1] 10/18/2014  . Elevated LFTs [R79.89]   . Alcohol abuse with physiological dependence [F10.20]   . Hallucinations, visual [R44.1]   . Noncompliance with medication regimen [Z91.14]   . Hallucinations [R44.3] 07/07/2014  . Paranoia [F22] 07/07/2014  . Abnormal liver enzymes [R74.8] 02/14/2014  . Cannabis abuse [F12.10] 12/30/2013  . Schizoaffective disorder [F25.9] 12/29/2013  . Post traumatic stress disorder (PTSD) [F43.10] 11/03/2011  . Alcohol dependence [F10.20] 11/03/2011  . Active smoker [Z72.0] 06/30/2011    Total Time spent with patient: 45 minutes  Subjective:   [name] is a 44 y.o. male patient admitted with alcohol intoxication and having suicidal thoughts.[name]  HPI:  Patient is 44 year old Caucasian single man who was admitted on the medical floor because of alcohol intoxication and having auditory or visual hallucination.  He also mentions suicidal thoughts and plan to kill himself.  He's been drinking heavily in past one week.  He admitted that he's been more depressed and feeling hopeless and worthless and wanted to kill himself by drinking.  He was last seen in January 16 in the emergency room and then he was sent to [facility] where he stayed for one week.  He mentioned that his medicine is not working very well.  He's been seeing images and having strong suicidal thoughts.  His blood alcohol level was 362 and he has high liver enzymes.  He is on Tegretol, Zoloft and Seroquel.  Patient lives by himself.  He has a brother and sister that he does not see his family as much.  He  admitted poor sleep, racing thoughts, paranoia and wanted to kill himself by drinking.  Patient appears very restless and tremulous.  Patient has history of seizures due to alcohol withdrawal.  HPI Elements:   Location:  Medical floor. Quality:  poor. Severity:  unable to function. Timing:  severe. Duration:  2 weeks.  Past Medical History:  Past Medical History  Diagnosis Date  . Depression   . Psychosis   . PTSD (post-traumatic stress disorder)   . Seizure disorder     related to etoh seizure  . Alcohol abuse   . Schizoaffective disorder   . Anxiety     Past Surgical History  Procedure Laterality Date  . Foot surgery      right  . Hand surgery     Family History:  Family History  Problem Relation Age of Onset  . Alcoholism Mother   . CAD Father   . Depression Brother    Social History:  History  Alcohol Use  . 50.4 oz/week  . 84 Cans of beer per week    Comment: 6-8 cans beer per day, 24 ounce cans for past 1 month     History  Drug Use  . 1.00 per week  . Special: Marijuana    Comment: THC 2 to 3 times per month    History   Social History  . Marital Status: Single    Spouse Name: N/A  . Number of Children: N/A  . Years of Education: N/A   Social History Main Topics  . Smoking status: Current Every Day Smoker -- 1.50 packs/day for 24 years  Types: Cigarettes  . Smokeless tobacco: Never Used  . Alcohol Use: 50.4 oz/week    84 Cans of beer per week     Comment: 6-8 cans beer per day, 24 ounce cans for past 1 month  . Drug Use: 1.00 per week    Special: Marijuana     Comment: THC 2 to 3 times per month  . Sexual Activity: Yes   Other Topics Concern  . None   Social History Narrative   Additional Social History:                          Allergies:   Allergies  Allergen Reactions  . Wellbutrin [Bupropion] Other (See Comments)    Makes me feel like I'm have a seizure    Vitals: Blood pressure 113/86, pulse 93, temperature 97.4  F (36.3 C), temperature source Oral, resp. rate 18, height 6' (1.829 m), weight 87.9 kg (193 lb 12.6 oz), SpO2 97 %.  Risk to Self: Is patient at risk for suicide?: Yes Risk to Others:   Prior Inpatient Therapy:   Prior Outpatient Therapy:    Current Facility-Administered Medications  Medication Dose Route Frequency Provider Last Rate Last Dose  . carbamazepine (TEGRETOL) tablet 200 mg  200 mg Oral QHS Toy Baker, MD   200 mg at 10/21/14 2104  . docusate sodium (COLACE) capsule 100 mg  100 mg Oral BID Toy Baker, MD   100 mg at 10/22/14 0954  . feeding supplement (ENSURE COMPLETE) (ENSURE COMPLETE) liquid 237 mL  237 mL Oral BID BM Dorann Ou, RD   237 mL at 10/22/14 1000  . folic acid (FOLVITE) tablet 1 mg  1 mg Oral Daily Theodis Blaze, MD   1 mg at 10/22/14 0954  . gabapentin (NEURONTIN) capsule 300 mg  300 mg Oral QHS Toy Baker, MD   300 mg at 10/21/14 2105  . HYDROmorphone (DILAUDID) injection 0.5 mg  0.5 mg Intravenous Q2H PRN Theodis Blaze, MD   0.5 mg at 10/22/14 0955  . LORazepam (ATIVAN) tablet 1 mg  1 mg Oral Q4H PRN Theodis Blaze, MD   1 mg at 10/22/14 0954   Or  . LORazepam (ATIVAN) injection 1 mg  1 mg Intravenous Q4H PRN Theodis Blaze, MD      . metoprolol (LOPRESSOR) injection 5 mg  5 mg Intravenous Q6H PRN Theodis Blaze, MD      . multivitamin with minerals tablet 1 tablet  1 tablet Oral Daily Theodis Blaze, MD   1 tablet at 10/22/14 [phone]  . ondansetron (ZOFRAN) tablet 4 mg  4 mg Oral Q6H PRN Toy Baker, MD       Or  . ondansetron (ZOFRAN) injection 4 mg  4 mg Intravenous Q6H PRN Toy Baker, MD      . oxyCODONE (Oxy IR/ROXICODONE) immediate release tablet 5 mg  5 mg Oral Q6H PRN Theodis Blaze, MD   5 mg at 10/22/14 0440  . QUEtiapine (SEROQUEL) tablet 100 mg  100 mg Oral Daily Toy Baker, MD   100 mg at 10/22/14 0954  . QUEtiapine (SEROQUEL) tablet 300 mg  300 mg Oral QHS Toy Baker, MD   300 mg at 10/21/14  2105  . sertraline (ZOLOFT) tablet 50 mg  50 mg Oral Daily Toy Baker, MD   50 mg at 10/22/14 0954  . sodium chloride 0.9 % injection 3 mL  3 mL Intravenous Q12H Anastassia  Doutova, MD   3 mL at 10/22/14 0956  . thiamine (VITAMIN B-1) tablet 100 mg  100 mg Oral Daily Tatyana A Kirichenko, PA-C   100 mg at 10/22/14 3299    Musculoskeletal: Strength & Muscle Tone: within normal limits Gait & Station: unsteady Patient leans: Front and Backward  Psychiatric Specialty Exam: Physical Exam  ROS  Blood pressure 113/86, pulse 93, temperature 97.4 F (36.3 C), temperature source Oral, resp. rate 18, height 6' (1.829 m), weight 87.9 kg (193 lb 12.6 oz), SpO2 97 %.Body mass index is 26.28 kg/(m^2).  General Appearance: Casual and Disheveled  Eye Contact::  Fair  Speech:  Slow  Volume:  Decreased  Mood:  Anxious, Depressed, Dysphoric and Hopeless  Affect:  Constricted and Depressed  Thought Process:  Intact  Orientation:  Full (Time, Place, and Person)  Thought Content:  Hallucinations: Auditory Visual, Paranoid Ideation and Rumination  Suicidal Thoughts:  Yes.  with intent/plan  Homicidal Thoughts:  No  Memory:  Immediate;   Fair Recent;   Fair Remote;   Fair  Judgement:  Fair  Insight:  Lacking  Psychomotor Activity:  Restlessness and Tremor  Concentration:  Fair  Recall:  [employer] of Knowledge:Fair  Language: Fair  Akathisia:  No  Handed:  Right  AIMS (if indicated):     Assets:  Communication Skills Housing  ADL's:  Intact  Cognition: Impaired,  Mild  Sleep:      Medical Decision Making: Review of Psycho-Social Stressors (1), Review or order clinical lab tests (1), Review and summation of old records (2), Established Problem, Worsening (2), New Problem, with no additional work-up planned (3), Review of Medication Regimen & Side Effects (2) and Review of New Medication or Change in Dosage (2)  Treatment Plan Summary: Daily contact with patient to assess and evaluate  symptoms and progress in treatment and Medication management, the patient is on Zoloft, Seroquel and Tegretol.  He continues to have a lot of tremors shakes.  He is on detox protocol.  Consider low-dose lithium 300 mg twice a day since patient has a high liver enzymes.  Consultation liaison services will follow him to monitor his progress.  Patient will require setter for his safety.  Plan:  Recommend psychiatric Inpatient admission when medically cleared. continue sitter for safety.   Bryndan Bilyk T. 10/22/2014 11:55 AM

## 2014-10-22 NOTE — Progress Notes (Addendum)
Patient ID: [name], male   DOB: 1970/11/28, 44 y.o.   MRN: [id number]  [facility] PROGRESS NOTE  [name] HCW:[id number] DOB: 1971/05/19 DOA: 10/18/2014 PCP: No PCP Per Patient   Brief narrative:    44 y.o. male with depression, psychosis, PTSD, seizures, alcohol abuse, presented to [facility] ED with auditory hallucinations with suicidal ideations. He reported drinking as well and has fell down the stairs (lives on the send floor). In ED, pt noted to have tremors, Na 126, [facility] asked to admit for further evaluation.   Hospital course complicated by worsening transaminitis (thought to be secondary to alcohol induced hepatitis), now finally trending down since 3/6. In addition persistent tremors on exam and requiring increase in frequency of ativan per CIWA protocol from Q6 to Q 4 hours. Also awaiting for psych recommendations to see if pt needs [facility] vs outpatient follow up. Sitter still at bedside until pt seen by psych. PT evaluation done and recommends HH PT and rolling walker and orders have been placed in case pt Ok to go home.   Assessment/Plan:    Active Problems:  Alcohol withdrawal - still with tremors this AM but better, no events overnight  - continue to keep on CIWA protocol but will change the frequency of ativan from Q6 to Q4 hours until tremors improve  - MVI, folate and thiamine are being provided as well  Hyponatremia  - Na improving, 131 --> 134 this AM  - continue IVF and repeat BMP in AM - will possibly be able to stop IVF in next 24 hours   Hypokalemia - supplemented and WNL this AM   Schizoaffective disorder with suicidal ideations  - psych consulted, awaiting recommendations - continue to keep sitter at bedside   Abnormal liver enzymes - secondary to acute alcoholic hepatitis - finally started to trend down slowly  - pt tolerating diet well and has less discomfort tin the RUQ area  - repeat CMET in AM  Alcohol abuse with physiological dependence -  cessation discussed and pt verbalized understanding  Pancytopenia   - likely from alcohol induced bone marrow damage - no signs of active bleeding, blood counts overall stable and even improving this AM  - CBC in AM  Severe PCM - in the setting of heavy alcohol abuse and poor oral intake - tolerating regular diet well    Back pain after an episode of fall in the setting of alcohol use  - ambulate as pt able to tolerate  - please note XRAY's with no acute abnormalities and pt able to bear weight  - allow analgesia as needed   Seizures - no active seizures, continue tegretol   DVT prophylaxis - SCD's  Code Status: Full.  Family Communication: plan of care discussed with the patient Disposition Plan: Will be able to discharge once tremors resolved and once seen by psych, pt not suicidal today but awaiting psych rec's to see if he needs inpatient [facility] or can go home with outpatient follow up   IV access:  Peripheral IV  Procedures and diagnostic studies:    Dg Lumbar Spine Complete  10/18/2014   Normal exam.    Dg Sacrum/coccyx  10/18/2014  No acute findings.     Ct Head Wo Contrast  10/18/2014   Negative head CT.    US Abdomen Limited Ruq  10/19/2014  Increased liver echogenicity, a finding most likely indicative of hepatic steatosis. While no focal liver lesions are identified, it must be cautioned that the  sensitivity of ultrasound for focal liver lesions is diminished in this circumstance.  Medical Consultants:  None  Other Consultants:  None  IAnti-Infectives:   None  Faye Ramsay, MD  [facility] Pager [phone]  If 7PM-7AM, please contact night-coverage [email or web address] Password [email or web address] 10/22/2014, 9:30 AM   LOS: 4 days   HPI/Subjective: No events overnight. Pt reports feeling better but still with tremors this AM.  Objective: Filed Vitals:   10/21/14 0540 10/21/14 1335 10/21/14 2105 10/22/14 0433  BP: 119/87 112/75 126/84 113/86  Pulse: 97 103 99 93  Temp: 97.5 F  (36.4 C) 97.7 F (36.5 C) 97.9 F (36.6 C) 97.4 F (36.3 C)  TempSrc: Oral Oral Oral Oral  Resp: 20 18 20 18   Height:   6' (1.829 m)   Weight:   87.9 kg (193 lb 12.6 oz)   SpO2: 97% 96% 97% 97%    Intake/Output Summary (Last 24 hours) at 10/22/14 0930 Last data filed at 10/22/14 0438  Gross per 24 hour  Intake    510 ml  Output   2250 ml  Net  -1740 ml    Exam:   General:  Pt is alert, follows commands appropriately, not in acute distress  Cardiovascular: Regular rhythm, S1/S2, no murmurs, no rubs, no gallops  Respiratory: Clear to auscultation bilaterally, no wheezing, no crackles, no rhonchi  Abdomen: Soft, tender in upper abd quadrants, non distended, bowel sounds present, no guarding  Extremities: No edema, pulses DP and PT palpable bilaterally  Neuro: Tremors at rest and with movement   Data Reviewed: Basic Metabolic Panel:  Recent Labs Lab 10/18/14 2143  10/19/14 0800 10/19/14 1735 10/20/14 0337 10/21/14 0534 10/22/14 0425  NA  --   < > 125* 129* 132* 131* 134*  K  --   < > 3.5 3.7 3.6 3.9 4.3  CL  --   < > 87* 94* 98 99 104  CO2  --   < > 27 25 25 25 22   GLUCOSE  --   < > 87 102* 90 99 107*  BUN  --   < > 10 15 12 8 12   CREATININE  --   < > 0.62 0.62 0.66 0.63 0.70  CALCIUM  --   < > 8.4 9.2 9.1 8.8 9.2  MG 2.0  --  1.6  --   --  1.8  --   PHOS 2.9  --  2.9  --   --   --   --   < > = values in this interval not displayed. Liver Function Tests:  Recent Labs Lab 10/18/14 1759 10/19/14 0800 10/20/14 0337 10/21/14 0534 10/22/14 0425  AST 510* 369* 335* 424* 267*  ALT 353* 277* 253* 319* 289*  ALKPHOS 121* 106 103 111 102  BILITOT 1.0 1.1 1.1 1.0 0.7  PROT 6.9 6.0 6.1 6.3 6.3  ALBUMIN 4.3 3.8 3.8 3.7 3.8    Recent Labs Lab 10/18/14 1800  LIPASE 48    Recent Labs Lab 10/18/14 1813  AMMONIA 29   CBC:  Recent Labs Lab 10/18/14 1845 10/19/14 0800 10/20/14 0337 10/21/14 0534 10/22/14 0425  WBC 3.4* 3.4* 2.8* 3.4* 4.0   NEUTROABS 1.9  --   --   --   --   HGB 12.7* 11.2* 10.9* 11.0* 11.3*  HCT 35.6* 31.4* 31.8* 31.8* 33.7*  MCV 91.3 92.1 94.6 95.5 96.0  PLT 100* 95* 87* 92* 123*    Recent Results (from the past 240 hour(s))  MRSA PCR  Screening     Status: None   Collection Time: 10/19/14  7:00 AM  Result Value Ref Range Status   MRSA by PCR NEGATIVE NEGATIVE Final    Comment:        The GeneXpert MRSA Assay (FDA approved for NASAL specimens only), is one component of a comprehensive MRSA colonization surveillance program. It is not intended to diagnose MRSA infection nor to guide or monitor treatment for MRSA infections.   Clostridium Difficile by PCR     Status: None   Collection Time: 10/19/14  7:45 PM  Result Value Ref Range Status   C difficile by pcr NEGATIVE NEGATIVE Final     Scheduled Meds: . carbamazepine  200 mg Oral QHS  . docusate sodium  100 mg Oral BID  . feeding supplement (ENSURE COMPLETE)  237 mL Oral BID BM  . folic acid  1 mg Oral Daily  . gabapentin  300 mg Oral QHS  . multivitamin with minerals  1 tablet Oral Daily  . QUEtiapine  100 mg Oral Daily  . QUEtiapine  300 mg Oral QHS  . sertraline  50 mg Oral Daily  . sodium chloride  3 mL Intravenous Q12H  . thiamine  100 mg Oral Daily   Continuous Infusions:

## 2014-10-23 DIAGNOSIS — F1024 Alcohol dependence with alcohol-induced mood disorder: Secondary | ICD-10-CM

## 2014-10-23 DIAGNOSIS — F250 Schizoaffective disorder, bipolar type: Secondary | ICD-10-CM

## 2014-10-23 DIAGNOSIS — F4312 Post-traumatic stress disorder, chronic: Secondary | ICD-10-CM | POA: Insufficient documentation

## 2014-10-23 DIAGNOSIS — F10220 Alcohol dependence with intoxication, uncomplicated: Secondary | ICD-10-CM

## 2014-10-23 DIAGNOSIS — E43 Unspecified severe protein-calorie malnutrition: Secondary | ICD-10-CM

## 2014-10-23 LAB — COMPREHENSIVE METABOLIC PANEL
ALT: 276 U/L — ABNORMAL HIGH (ref 0–53)
AST: 216 U/L — ABNORMAL HIGH (ref 0–37)
Albumin: 3.7 g/dL (ref 3.5–5.2)
Alkaline Phosphatase: 108 U/L (ref 39–117)
Anion gap: 11 (ref 5–15)
BUN: 21 mg/dL (ref 6–23)
CO2: 22 mmol/L (ref 19–32)
Calcium: 9.3 mg/dL (ref 8.4–10.5)
Chloride: 101 mmol/L (ref 96–112)
Creatinine, Ser: 0.76 mg/dL (ref 0.50–1.35)
GFR calc Af Amer: >90 mL/min (ref >90)
GFR calc non Af Amer: >90 mL/min (ref >90)
Glucose, Bld: 101 mg/dL — ABNORMAL HIGH (ref 70–99)
Potassium: 4.1 mmol/L (ref 3.5–5.1)
Sodium: 134 mmol/L — ABNORMAL LOW (ref 135–145)
Total Bilirubin: 0.4 mg/dL (ref 0.3–1.2)
Total Protein: 6.2 g/dL (ref 6.0–8.3)

## 2014-10-23 LAB — CBC
HCT: 32.8 % — ABNORMAL LOW (ref 39.0–52.0)
Hemoglobin: 11 g/dL — ABNORMAL LOW (ref 13.0–17.0)
MCH: 32.7 pg (ref 26.0–34.0)
MCHC: 33.5 g/dL (ref 30.0–36.0)
MCV: 97.6 fL (ref 78.0–100.0)
Platelets: 151 10*3/uL (ref 150–400)
RBC: 3.36 MIL/uL — ABNORMAL LOW (ref 4.22–5.81)
RDW: 15.2 % (ref 11.5–15.5)
WBC: 4.1 10*3/uL (ref 4.0–10.5)

## 2014-10-23 MED ORDER — LITHIUM CARBONATE 300 MG PO CAPS
300.0000 mg | ORAL_CAPSULE | Freq: Two times a day (BID) | ORAL | Status: DC
  Administered 2014-10-24 (×2): 300 mg via ORAL
  Filled 2014-10-23 (×3): qty 1

## 2014-10-23 NOTE — Progress Notes (Signed)
[facility] PROGRESS NOTE  [name] IEP:[id number] DOB: 1970-10-16 DOA: 10/18/2014 PCP: No PCP Per Patient  Assessment/Plan: #1 alcohol withdrawal Patient still with tremors. Continue the Ativan CIWA protocol. Continue multivitamin, folic acid, thiamine. IV fluids. Follow.  #2 hyponatremia Improving with IV fluids follow.  #3 hypokalemia Repleted.  #4 transaminitis LFTs trending down. Likely secondary to hepatic steatosis and alcohol abuse. Acute hepatitis panel is negative. Follow.  #5 schizoaffective disorder Patient has seen in consultation by psychiatry and psychiatry recommended inpatient psych once patient is medically stable. Per psychiatry continue Zoloft, Seroquel, Neurontin, Tegretol. Psychiatry recommended lithium 300 mg twice a day and a such will start on lithium and monitor renal function closely.  #6 pancytopenia Likely secondary to chronic alcohol abuse. No signs or symptoms of bleeding. Platelet function has improved. WBC has improved. Hemoglobin currently stable. Follow.  #7 severe protein calorie malnutrition Secondary to heavy alcohol abuse. Tolerating current diet.  #8 back pain after fall in the setting of alcohol use X-rays are negative for any acute fracture. Continue pain management.  #9 seizures No active seizures at this time. Continue Tegretol.  #10 prophylaxis SCDs for DVT prophylaxis.  Code Status: Full Family Communication: Updated patient and family present. Disposition Plan: Inpatient psychiatry when medically stable.   Consultants:  Psychiatry: Dr. Adele Schilder 10/22/2014  Procedures:  CT head 10/18/2014  X-ray of the sacrum 10/18/2014  X-ray of the L-spine 10/18/2014  Right upper quadrant ultrasound 10/19/2014  Antibiotics:  None  HPI/Subjective: Patient still with auditory hallucinations. Patient complaining of back pain.  Objective: Filed Vitals:   10/23/14 1255  BP: 114/70  Pulse: 105  Temp: 98.4 F (36.9 C)   Resp: 20    Intake/Output Summary (Last 24 hours) at 10/23/14 1346 Last data filed at 10/23/14 1326  Gross per 24 hour  Intake    960 ml  Output   1375 ml  Net   -415 ml   Filed Weights   10/21/14 2105  Weight: 87.9 kg (193 lb 12.6 oz)    Exam:   General:  NAD. Tremors  Cardiovascular: Tacycardia  Respiratory: Clear to auscultation bilaterally.  Abdomen: Soft, nontender, nondistended, positive bowel sounds.  Musculoskeletal: No clubbing cyanosis or edema.  Data Reviewed: Basic Metabolic Panel:  Recent Labs Lab 10/18/14 2143  10/19/14 0800 10/19/14 1735 10/20/14 0337 10/21/14 0534 10/22/14 0425 10/23/14 0543  NA  --   < > 125* 129* 132* 131* 134* 134*  K  --   < > 3.5 3.7 3.6 3.9 4.3 4.1  CL  --   < > 87* 94* 98 99 104 101  CO2  --   < > 27 25 25 25 22 22   GLUCOSE  --   < > 87 102* 90 99 107* 101*  BUN  --   < > 10 15 12 8 12 21   CREATININE  --   < > 0.62 0.62 0.66 0.63 0.70 0.76  CALCIUM  --   < > 8.4 9.2 9.1 8.8 9.2 9.3  MG 2.0  --  1.6  --   --  1.8  --   --   PHOS 2.9  --  2.9  --   --   --   --   --   < > = values in this interval not displayed. Liver Function Tests:  Recent Labs Lab 10/19/14 0800 10/20/14 0337 10/21/14 0534 10/22/14 0425 10/23/14 0543  AST 369* 335* 424* 267* 216*  ALT 277* 253* 319* 289* 276*  ALKPHOS 106 103 111 102 108  BILITOT 1.1 1.1 1.0 0.7 0.4  PROT 6.0 6.1 6.3 6.3 6.2  ALBUMIN 3.8 3.8 3.7 3.8 3.7    Recent Labs Lab 10/18/14 1800  LIPASE 48    Recent Labs Lab 10/18/14 1813  AMMONIA 29   CBC:  Recent Labs Lab 10/18/14 1845 10/19/14 0800 10/20/14 0337 10/21/14 0534 10/22/14 0425 10/23/14 0543  WBC 3.4* 3.4* 2.8* 3.4* 4.0 4.1  NEUTROABS 1.9  --   --   --   --   --   HGB 12.7* 11.2* 10.9* 11.0* 11.3* 11.0*  HCT 35.6* 31.4* 31.8* 31.8* 33.7* 32.8*  MCV 91.3 92.1 94.6 95.5 96.0 97.6  PLT 100* 95* 87* 92* 123* 151   Cardiac Enzymes: No results for input(s): CKTOTAL, CKMB, CKMBINDEX, TROPONINI in  the last 168 hours. BNP (last 3 results) No results for input(s): BNP in the last 8760 hours.  ProBNP (last 3 results) No results for input(s): PROBNP in the last 8760 hours.  CBG: No results for input(s): GLUCAP in the last 168 hours.  Recent Results (from the past 240 hour(s))  MRSA PCR Screening     Status: None   Collection Time: 10/19/14  7:00 AM  Result Value Ref Range Status   MRSA by PCR NEGATIVE NEGATIVE Final    Comment:        The GeneXpert MRSA Assay (FDA approved for NASAL specimens only), is one component of a comprehensive MRSA colonization surveillance program. It is not intended to diagnose MRSA infection nor to guide or monitor treatment for MRSA infections.   Clostridium Difficile by PCR     Status: None   Collection Time: 10/19/14  7:45 PM  Result Value Ref Range Status   C difficile by pcr NEGATIVE NEGATIVE Final     Studies: No results found.  Scheduled Meds: . carbamazepine  200 mg Oral QHS  . docusate sodium  100 mg Oral BID  . feeding supplement (ENSURE COMPLETE)  237 mL Oral BID BM  . folic acid  1 mg Oral Daily  . gabapentin  300 mg Oral QHS  . multivitamin with minerals  1 tablet Oral Daily  . nicotine  21 mg Transdermal Daily  . QUEtiapine  100 mg Oral Daily  . QUEtiapine  300 mg Oral QHS  . sertraline  50 mg Oral Daily  . sodium chloride  3 mL Intravenous Q12H  . thiamine  100 mg Oral Daily   Continuous Infusions:   Principal Problem:   Alcohol dependence Active Problems:   Schizoaffective disorder   Abnormal liver enzymes   Alcohol abuse with physiological dependence   Hyponatremia   Protein-calorie malnutrition, severe    Time spent: [place] MD [facility] Pager [phone]. If 7PM-7AM, please contact night-coverage at [email or web address], password [facility] 10/23/2014, 1:46 PM  LOS: 5 days

## 2014-10-23 NOTE — Progress Notes (Signed)
CSW met with Pt along with psych MD. Pt discussed his pattern of use with alcohol as well as other mental health needs. Pt reports that he has been drinking 1 case of beer daily for the last 30 days. Pt reports a long history of alcohol and marijuana abuse, beginning at approximately age 44. He reports that around age 59, he began experiencing symptoms of psychosis and was diagnosed with schizoaffective disorder; Pt reports that he has been on medication since that time. He reports living in [place] for the past 4 years and has 2 siblings that live close to him; he describes having a close relationship with his siblings. Pt and psych MD discussed the recommendation for Pt to receive either residential treatment for substance abuse or further psychiatric treatment at an inpatient facility. Pt reports that he would like to consider his options before making a decision. Pt also discussed his PTSD and anxiety symptoms.   CSW will continue to follow Pt while hospitalized and assist with discharge planning as needed. Psych MD to continue monitoring medications and Pt progress.   Peri Maris, [place] 10/23/2014 2:02 PM

## 2014-10-23 NOTE — Consult Note (Signed)
Psychiatry Consult follow-up note  Reason for Consult:  Psychosis and suicidal thoughts Referring Physician:  Dr. Doyle Askew Patient Identification: [name] MRN:  [id number] Principal Diagnosis: Alcohol dependence, schizoaffective disorder Diagnosis:   Patient Active Problem List   Diagnosis Date Noted  . Protein-calorie malnutrition, severe [E43] 10/19/2014  . Hyponatremia [E87.1] 10/18/2014  . Elevated LFTs [R79.89]   . Alcohol abuse with physiological dependence [F10.20]   . Hallucinations, visual [R44.1]   . Noncompliance with medication regimen [Z91.14]   . Hallucinations [R44.3] 07/07/2014  . Paranoia [F22] 07/07/2014  . Abnormal liver enzymes [R74.8] 02/14/2014  . Cannabis abuse [F12.10] 12/30/2013  . Schizoaffective disorder [F25.9] 12/29/2013  . Post traumatic stress disorder (PTSD) [F43.10] 11/03/2011  . Alcohol dependence [F10.20] 11/03/2011  . Active smoker [Z72.0] 06/30/2011    Total Time spent with patient: 30 minutes  Subjective:   [name] is a 44 y.o. male patient admitted with alcohol intoxication and having suicidal thoughts.[name]  HPI:  Patient is 44 year old Caucasian single man who was admitted on the medical floor because of alcohol intoxication and having auditory or visual hallucination.  He also mentions suicidal thoughts and plan to kill himself.  He's been drinking heavily in past one week.  He admitted that he's been more depressed and feeling hopeless and worthless and wanted to kill himself by drinking.  He was last seen in January 16 in the emergency room and then he was sent to [facility] where he stayed for one week.  He mentioned that his medicine is not working very well.  He's been seeing images and having strong suicidal thoughts.  His blood alcohol level was 362 and he has high liver enzymes.  He is on Tegretol, Zoloft and Seroquel.  Patient lives by himself.  He has a brother and sister that he does not see his family as much.  He  admitted poor sleep, racing thoughts, paranoia and wanted to kill himself by drinking.  Patient appears very restless and tremulous.  Patient has history of seizures due to alcohol withdrawal.  Interval history: Patient seen with case managers for psychiatric consultation follow-up. Patient appeared lying down in his bed, awake, alert and oriented to time place and person. Patient reported he has been partially compliant with his psychiatric medication for schizoaffective disorder, depression and posttraumatic stress disorder. Patient reportedly drinking one case of beer daily over 30 days prior to admission to the hospital and presented with blood alcohol level 362 and urine drug screen is positive for marijuana and significant withdrawal symptoms of sweating tremors and unable to walk. Patient stated he was seeing his ex-girlfriend [name] in front of him about 5 years ago in [place]. Patient also started talking about delusional thinking about seeing shadows in the past. Patient currently depressed, anxious but denies active suicidal ideation, intention or plans. Patient reported he has passive suicidal ideation like want to go home and not wake up next day morning. Patient has no family support and lives by himself on disability. Patient was seen at [facility] for outpatient psychiatric medication management.  HPI Elements:   Location:  Medical floor. Quality:  poor. Severity:  unable to function. Timing:  severe. Duration:  2 weeks.  Past Medical History:  Past Medical History  Diagnosis Date  . Depression   . Psychosis   . PTSD (post-traumatic stress disorder)   . Seizure disorder     related to etoh seizure  . Alcohol abuse   . Schizoaffective disorder   .  Anxiety     Past Surgical History  Procedure Laterality Date  . Foot surgery      right  . Hand surgery     Family History:  Family History  Problem Relation Age of Onset  . Alcoholism Mother   . CAD Father   . Depression  Brother    Social History:  History  Alcohol Use  . 50.4 oz/week  . 84 Cans of beer per week    Comment: 6-8 cans beer per day, 24 ounce cans for past 1 month     History  Drug Use  . 1.00 per week  . Special: Marijuana    Comment: THC 2 to 3 times per month    History   Social History  . Marital Status: Single    Spouse Name: N/A  . Number of Children: N/A  . Years of Education: N/A   Social History Main Topics  . Smoking status: Current Every Day Smoker -- 1.50 packs/day for 24 years    Types: Cigarettes  . Smokeless tobacco: Never Used  . Alcohol Use: 50.4 oz/week    84 Cans of beer per week     Comment: 6-8 cans beer per day, 24 ounce cans for past 1 month  . Drug Use: 1.00 per week    Special: Marijuana     Comment: THC 2 to 3 times per month  . Sexual Activity: Yes   Other Topics Concern  . None   Social History Narrative   Additional Social History:                          Allergies:   Allergies  Allergen Reactions  . Wellbutrin [Bupropion] Other (See Comments)    Makes me feel like I'm have a seizure    Vitals: Blood pressure 121/86, pulse 102, temperature 97.5 F (36.4 C), temperature source Oral, resp. rate 20, height 6' (1.829 m), weight 87.9 kg (193 lb 12.6 oz), SpO2 98 %.  Risk to Self: Is patient at risk for suicide?: Yes Risk to Others:   Prior Inpatient Therapy:   Prior Outpatient Therapy:    Current Facility-Administered Medications  Medication Dose Route Frequency Provider Last Rate Last Dose  . carbamazepine (TEGRETOL) tablet 200 mg  200 mg Oral QHS Toy Baker, MD   200 mg at 10/22/14 2151  . docusate sodium (COLACE) capsule 100 mg  100 mg Oral BID Toy Baker, MD   100 mg at 10/23/14 0911  . feeding supplement (ENSURE COMPLETE) (ENSURE COMPLETE) liquid 237 mL  237 mL Oral BID BM Dorann Ou, RD   237 mL at 10/23/14 0911  . folic acid (FOLVITE) tablet 1 mg  1 mg Oral Daily Theodis Blaze, MD   1 mg at  10/23/14 0910  . gabapentin (NEURONTIN) capsule 300 mg  300 mg Oral QHS Toy Baker, MD   300 mg at 10/22/14 2151  . HYDROmorphone (DILAUDID) injection 0.5 mg  0.5 mg Intravenous Q2H PRN Theodis Blaze, MD   0.5 mg at 10/23/14 0620  . LORazepam (ATIVAN) tablet 1 mg  1 mg Oral Q4H PRN Theodis Blaze, MD   1 mg at 10/22/14 1910   Or  . LORazepam (ATIVAN) injection 1 mg  1 mg Intravenous Q4H PRN Theodis Blaze, MD   1 mg at 10/23/14 6761  . metoprolol (LOPRESSOR) injection 5 mg  5 mg Intravenous Q6H PRN Theodis Blaze, MD      .  multivitamin with minerals tablet 1 tablet  1 tablet Oral Daily Theodis Blaze, MD   1 tablet at 10/23/14 0910  . nicotine (NICODERM CQ - dosed in mg/24 hours) patch 21 mg  21 mg Transdermal Daily Theodis Blaze, MD   21 mg at 10/23/14 0910  . ondansetron (ZOFRAN) tablet 4 mg  4 mg Oral Q6H PRN Toy Baker, MD       Or  . ondansetron (ZOFRAN) injection 4 mg  4 mg Intravenous Q6H PRN Toy Baker, MD      . oxyCODONE (Oxy IR/ROXICODONE) immediate release tablet 5 mg  5 mg Oral Q6H PRN Theodis Blaze, MD   5 mg at 10/22/14 0440  . QUEtiapine (SEROQUEL) tablet 100 mg  100 mg Oral Daily Toy Baker, MD   100 mg at 10/23/14 0910  . QUEtiapine (SEROQUEL) tablet 300 mg  300 mg Oral QHS Toy Baker, MD   300 mg at 10/22/14 2151  . sertraline (ZOLOFT) tablet 50 mg  50 mg Oral Daily Toy Baker, MD   50 mg at 10/23/14 0910  . sodium chloride 0.9 % injection 3 mL  3 mL Intravenous Q12H Toy Baker, MD   3 mL at 10/23/14 0912  . thiamine (VITAMIN B-1) tablet 100 mg  100 mg Oral Daily Tatyana A Kirichenko, PA-C   100 mg at 10/23/14 0910    Musculoskeletal: Strength & Muscle Tone: within normal limits Gait & Station: unsteady Patient leans: Front and Backward  Psychiatric Specialty Exam: Physical Exam  ROS  Blood pressure 121/86, pulse 102, temperature 97.5 F (36.4 C), temperature source Oral, resp. rate 20, height 6' (1.829 m), weight  87.9 kg (193 lb 12.6 oz), SpO2 98 %.Body mass index is 26.28 kg/(m^2).  General Appearance: Casual and Disheveled  Eye Contact::  Fair  Speech:  Slow  Volume:  Decreased  Mood:  Anxious, Depressed, Dysphoric and Hopeless  Affect:  Constricted and Depressed  Thought Process:  Intact  Orientation:  Full (Time, Place, and Person)  Thought Content:  Hallucinations: Auditory Visual, Paranoid Ideation and Rumination  Suicidal Thoughts:  Yes.  with intent/plan  Homicidal Thoughts:  No  Memory:  Immediate;   Fair Recent;   Fair Remote;   Fair  Judgement:  Fair  Insight:  Lacking  Psychomotor Activity:  Restlessness and Tremor  Concentration:  Fair  Recall:  [employer] of Knowledge:Fair  Language: Fair  Akathisia:  No  Handed:  Right  AIMS (if indicated):     Assets:  Communication Skills Housing  ADL's:  Intact  Cognition: Impaired,  Mild  Sleep:      Medical Decision Making: Review of Psycho-Social Stressors (1), Review or order clinical lab tests (1), Review and summation of old records (2), Established Problem, Worsening (2), New Problem, with no additional work-up planned (3), Review of Medication Regimen & Side Effects (2) and Review of New Medication or Change in Dosage (2)  Treatment Plan Summary: Daily contact with patient to assess and evaluate symptoms and progress in treatment and Medication management,  Consultation liaison services will follow him to monitor his progress.   Patient will require setter for his safety.  Plan:  Continue detox protocol for alcohol with Ativan Continue current medication management - Zoloft 50 mg daily, Seroquel 400 mg daily morning and 300 mg at bedtime, gabapentin 300 mg at bedtime and Tegretol 200 mg at bedtime Monitor for therapeutic levels of Tegretol Consider lithium 300 mg twice daily as recommended by Dr.  Pearsall Recommend psychiatric Inpatient admission when medically cleared. continue sitter for  safety.   Jadan Rouillard,[name] 10/23/2014 9:22 AM

## 2014-10-24 ENCOUNTER — Encounter (HOSPITAL_COMMUNITY): Payer: Self-pay | Admitting: *Deleted

## 2014-10-24 ENCOUNTER — Inpatient Hospital Stay (HOSPITAL_COMMUNITY)
Admission: AD | Admit: 2014-10-24 | Discharge: 2014-10-30 | DRG: 885 | Disposition: A | Discharge status: Home / Self Care | Payer: Medicare Other | Source: Intra-hospital | Attending: Psychiatry | Admitting: Psychiatry

## 2014-10-24 DIAGNOSIS — F259 Schizoaffective disorder, unspecified: Secondary | ICD-10-CM | POA: Diagnosis present

## 2014-10-24 DIAGNOSIS — F10230 Alcohol dependence with withdrawal, uncomplicated: Secondary | ICD-10-CM | POA: Insufficient documentation

## 2014-10-24 DIAGNOSIS — F10929 Alcohol use, unspecified with intoxication, unspecified: Secondary | ICD-10-CM

## 2014-10-24 DIAGNOSIS — F17210 Nicotine dependence, cigarettes, uncomplicated: Secondary | ICD-10-CM | POA: Diagnosis present

## 2014-10-24 DIAGNOSIS — F10129 Alcohol abuse with intoxication, unspecified: Secondary | ICD-10-CM | POA: Insufficient documentation

## 2014-10-24 DIAGNOSIS — R441 Visual hallucinations: Secondary | ICD-10-CM | POA: Diagnosis present

## 2014-10-24 DIAGNOSIS — G4700 Insomnia, unspecified: Secondary | ICD-10-CM | POA: Diagnosis present

## 2014-10-24 DIAGNOSIS — Y908 Blood alcohol level of 240 mg/100 ml or more: Secondary | ICD-10-CM | POA: Diagnosis present

## 2014-10-24 DIAGNOSIS — F1020 Alcohol dependence, uncomplicated: Secondary | ICD-10-CM | POA: Diagnosis present

## 2014-10-24 DIAGNOSIS — F10239 Alcohol dependence with withdrawal, unspecified: Secondary | ICD-10-CM | POA: Diagnosis not present

## 2014-10-24 DIAGNOSIS — R45851 Suicidal ideations: Secondary | ICD-10-CM | POA: Diagnosis present

## 2014-10-24 DIAGNOSIS — F10121 Alcohol abuse with intoxication delirium: Secondary | ICD-10-CM

## 2014-10-24 DIAGNOSIS — F258 Other schizoaffective disorders: Secondary | ICD-10-CM | POA: Diagnosis not present

## 2014-10-24 DIAGNOSIS — F1099 Alcohol use, unspecified with unspecified alcohol-induced disorder: Secondary | ICD-10-CM | POA: Diagnosis not present

## 2014-10-24 LAB — COMPREHENSIVE METABOLIC PANEL
ALT: 283 U/L — ABNORMAL HIGH (ref 0–53)
AST: 197 U/L — ABNORMAL HIGH (ref 0–37)
Albumin: 3.8 g/dL (ref 3.5–5.2)
Alkaline Phosphatase: 109 U/L (ref 39–117)
Anion gap: 10 (ref 5–15)
BUN: 17 mg/dL (ref 6–23)
CO2: 24 mmol/L (ref 19–32)
Calcium: 9.5 mg/dL (ref 8.4–10.5)
Chloride: 102 mmol/L (ref 96–112)
Creatinine, Ser: 0.73 mg/dL (ref 0.50–1.35)
GFR calc Af Amer: >90 mL/min (ref >90)
GFR calc non Af Amer: >90 mL/min (ref >90)
Glucose, Bld: 93 mg/dL (ref 70–99)
Potassium: 4.5 mmol/L (ref 3.5–5.1)
Sodium: 136 mmol/L (ref 135–145)
Total Bilirubin: 0.5 mg/dL (ref 0.3–1.2)
Total Protein: 6.5 g/dL (ref 6.0–8.3)

## 2014-10-24 LAB — CBC
HCT: 32.4 % — ABNORMAL LOW (ref 39.0–52.0)
Hemoglobin: 10.8 g/dL — ABNORMAL LOW (ref 13.0–17.0)
MCH: 32.7 pg (ref 26.0–34.0)
MCHC: 33.3 g/dL (ref 30.0–36.0)
MCV: 98.2 fL (ref 78.0–100.0)
Platelets: 202 10*3/uL (ref 150–400)
RBC: 3.3 MIL/uL — ABNORMAL LOW (ref 4.22–5.81)
RDW: 15.7 % — ABNORMAL HIGH (ref 11.5–15.5)
WBC: 4.9 10*3/uL (ref 4.0–10.5)

## 2014-10-24 MED ORDER — NAPROXEN 500 MG PO TABS
500.0000 mg | ORAL_TABLET | Freq: Two times a day (BID) | ORAL | Status: DC | PRN
  Administered 2014-10-24 – 2014-10-25 (×2): 500 mg via ORAL
  Filled 2014-10-24 (×2): qty 1

## 2014-10-24 MED ORDER — GABAPENTIN 300 MG PO CAPS
300.0000 mg | ORAL_CAPSULE | Freq: Every day | ORAL | Status: DC
  Administered 2014-10-24 – 2014-10-29 (×7): 300 mg via ORAL
  Filled 2014-10-24 (×7): qty 1
  Filled 2014-10-24: qty 3
  Filled 2014-10-24 (×5): qty 1

## 2014-10-24 MED ORDER — ALUM & MAG HYDROXIDE-SIMETH 200-200-20 MG/5ML PO SUSP: 30.0000 mL | ORAL | Status: DC | PRN

## 2014-10-24 MED ORDER — ACETAMINOPHEN 325 MG PO TABS
650.0000 mg | ORAL_TABLET | Freq: Four times a day (QID) | ORAL | Status: DC | PRN
  Filled 2014-10-24: qty 2

## 2014-10-24 MED ORDER — LORAZEPAM 1 MG PO TABS: 1.0000 mg | ORAL_TABLET | ORAL | Status: DC | PRN

## 2014-10-24 MED ORDER — DOCUSATE SODIUM 100 MG PO CAPS: 100.0000 mg | ORAL_CAPSULE | Freq: Two times a day (BID) | ORAL | Status: DC

## 2014-10-24 MED ORDER — FOLIC ACID 1 MG PO TABS: 1.0000 mg | ORAL_TABLET | Freq: Every day | ORAL | Status: DC

## 2014-10-24 MED ORDER — SERTRALINE HCL 50 MG PO TABS
50.0000 mg | ORAL_TABLET | Freq: Every day | ORAL | Status: DC
  Administered 2014-10-25 – 2014-10-30 (×6): 50 mg via ORAL
  Filled 2014-10-24 (×5): qty 1
  Filled 2014-10-24: qty 3
  Filled 2014-10-24 (×4): qty 1

## 2014-10-24 MED ORDER — CARBAMAZEPINE 200 MG PO TABS
200.0000 mg | ORAL_TABLET | Freq: Every day | ORAL | Status: DC
  Administered 2014-10-24 – 2014-10-25 (×2): 200 mg via ORAL
  Filled 2014-10-24 (×5): qty 1

## 2014-10-24 MED ORDER — LORAZEPAM 1 MG PO TABS
1.0000 mg | ORAL_TABLET | Freq: Four times a day (QID) | ORAL | Status: DC | PRN
  Administered 2014-10-24 – 2014-10-27 (×8): 1 mg via ORAL
  Filled 2014-10-24 (×9): qty 1

## 2014-10-24 MED ORDER — LITHIUM CARBONATE 300 MG PO CAPS
300.0000 mg | ORAL_CAPSULE | Freq: Two times a day (BID) | ORAL | Status: DC
  Administered 2014-10-25 – 2014-10-30 (×11): 300 mg via ORAL
  Filled 2014-10-24: qty 1
  Filled 2014-10-24: qty 6
  Filled 2014-10-24 (×9): qty 1
  Filled 2014-10-24: qty 6
  Filled 2014-10-24 (×6): qty 1

## 2014-10-24 MED ORDER — CLOMIPHENE CITRATE 50 MG PO TABS
100.0000 mg | ORAL_TABLET | Freq: Every day | ORAL | Status: DC
  Filled 2014-10-24 (×2): qty 2

## 2014-10-24 MED ORDER — LITHIUM CARBONATE 300 MG PO CAPS: 300.0000 mg | ORAL_CAPSULE | Freq: Two times a day (BID) | ORAL | Status: DC

## 2014-10-24 MED ORDER — THIAMINE HCL 100 MG PO TABS: 100.0000 mg | ORAL_TABLET | Freq: Every day | ORAL | Status: DC

## 2014-10-24 MED ORDER — NICOTINE 21 MG/24HR TD PT24
21.0000 mg | MEDICATED_PATCH | Freq: Every day | TRANSDERMAL | Status: DC
Start: 2014-10-24 — End: 2014-10-30

## 2014-10-24 MED ORDER — ONDANSETRON 4 MG PO TBDP: 4.0000 mg | ORAL_TABLET | Freq: Four times a day (QID) | ORAL | Status: AC | PRN

## 2014-10-24 MED ORDER — ADULT MULTIVITAMIN W/MINERALS CH: 1.0000 | ORAL_TABLET | Freq: Every day | ORAL | Status: DC

## 2014-10-24 MED ORDER — FOLIC ACID 1 MG PO TABS
1.0000 mg | ORAL_TABLET | Freq: Every day | ORAL | Status: DC
  Administered 2014-10-25 – 2014-10-30 (×6): 1 mg via ORAL
  Filled 2014-10-24 (×9): qty 1

## 2014-10-24 MED ORDER — HYDROXYZINE HCL 25 MG PO TABS
25.0000 mg | ORAL_TABLET | Freq: Four times a day (QID) | ORAL | Status: AC | PRN
  Administered 2014-10-27: 25 mg via ORAL
  Filled 2014-10-24: qty 1

## 2014-10-24 MED ORDER — ENSURE COMPLETE PO LIQD: 237.0000 mL | Freq: Two times a day (BID) | ORAL | Status: DC

## 2014-10-24 MED ORDER — MAGNESIUM HYDROXIDE 400 MG/5ML PO SUSP: 30.0000 mL | Freq: Every day | ORAL | Status: DC | PRN

## 2014-10-24 MED ORDER — DOCUSATE SODIUM 100 MG PO CAPS
100.0000 mg | ORAL_CAPSULE | Freq: Two times a day (BID) | ORAL | Status: DC
  Administered 2014-10-24 – 2014-10-30 (×8): 100 mg via ORAL
  Filled 2014-10-24 (×18): qty 1

## 2014-10-24 MED ORDER — VITAMIN B-1 100 MG PO TABS
100.0000 mg | ORAL_TABLET | Freq: Every day | ORAL | Status: DC
  Administered 2014-10-25 – 2014-10-30 (×6): 100 mg via ORAL
  Filled 2014-10-24 (×9): qty 1

## 2014-10-24 MED ORDER — NICOTINE 21 MG/24HR TD PT24
21.0000 mg | MEDICATED_PATCH | Freq: Every day | TRANSDERMAL | Status: DC
  Administered 2014-10-25 – 2014-10-30 (×6): 21 mg via TRANSDERMAL
  Filled 2014-10-24: qty 1
  Filled 2014-10-24: qty 14
  Filled 2014-10-24 (×7): qty 1

## 2014-10-24 MED ORDER — QUETIAPINE FUMARATE 300 MG PO TABS
300.0000 mg | ORAL_TABLET | Freq: Every day | ORAL | Status: DC
  Administered 2014-10-24 – 2014-10-29 (×6): 300 mg via ORAL
  Filled 2014-10-24 (×7): qty 3
  Filled 2014-10-24: qty 1
  Filled 2014-10-24 (×5): qty 3

## 2014-10-24 MED ORDER — ADULT MULTIVITAMIN W/MINERALS CH
1.0000 | ORAL_TABLET | Freq: Every day | ORAL | Status: DC
  Administered 2014-10-25 – 2014-10-30 (×6): 1 via ORAL
  Filled 2014-10-24 (×9): qty 1

## 2014-10-24 MED ORDER — OXYCODONE HCL 5 MG PO TABS: 5.0000 mg | ORAL_TABLET | Freq: Four times a day (QID) | ORAL | Status: DC | PRN

## 2014-10-24 MED ORDER — ENSURE COMPLETE PO LIQD
237.0000 mL | Freq: Two times a day (BID) | ORAL | Status: DC
  Administered 2014-10-26 – 2014-10-28 (×4): 237 mL via ORAL

## 2014-10-24 NOTE — Progress Notes (Addendum)
Clinical Social Work  Patient accepted to [facility] room 305-2. RN to call report to [phone]. [place] reports that patient needs to come after 7:30pm. CSW met with patient and explained DC plans. Patient reports he wants his sister to be called so she can be updated on plans. CSW called and spoke with sister who reports she will follow up with patient at [facility]. Patient signed voluntary form and reports he is happy that he will seeking treatment in hopes of feeling better. Voluntary form faxed to [facility] and placed on chart. CSW arranged [place] for 7:30 pm pick up and RN aware of plans.  CSW is signing off but available if needed.  [place], [place] [phone]

## 2014-10-24 NOTE — Discharge Summary (Signed)
Physician Discharge Summary  [name] TIW:[id number] DOB: 16-May-1971 DOA: 10/18/2014  PCP: No PCP Per Patient  Admit date: 10/18/2014 Discharge date: 10/24/2014  Time spent: 65 minutes  Recommendations for Outpatient Follow-up:  1. Patient be discharged to the inpatient psychiatric unit at behavioral health for further management of his schizoaffective disorder/auditory hallucinations/suicidal ideations.  Discharge Diagnoses:  Principal Problem:   Alcohol dependence Active Problems:   Schizoaffective disorder   Abnormal liver enzymes   Alcohol abuse with physiological dependence   Hyponatremia   Protein-calorie malnutrition, severe   Chronic posttraumatic stress disorder   Alcohol intoxication   Discharge Condition: Stable and improved  Diet recommendation: Regular  Filed Weights   10/21/14 2105  Weight: 87.9 kg (193 lb 12.6 oz)    History of present illness:  [name] is a 44 y.o. male   has a past medical history of Depression; Psychosis; PTSD (post-traumatic stress disorder); Seizure disorder; Alcohol abuse; Schizoaffective disorder; and Anxiety.   Presented with  Patient with hx of Alcohol abuse and schizoaffective disorder who presented to the emergency room with complaints that he had auditory hallucinations chronically but recently he started to have them non-stop. He had command hallucinations telling him to kill himself. He had not gone back to [place] for few months. His alcohol intake had increased. He had been binge drinking fo rthe past 1 month. Hx of alcohol withdrawal with seizures. Last drink was 10/18/14 at 3 PM. Patient felt tremulous. In ER Na noted 126. Had a recent fall. CT head unremarkable. Patient reported drinking all day and takes very little otherwise by mouth.  Hospitalist was called for admission for hyponatremia most likely secondary to beer [facility] Course:  #1 alcohol withdrawal Patient was admitted with concerns of  alcohol withdrawal and increased tremors and noted to have binge drinking for the past month. Patient was admitted to the telemetry floor where he was monitored and placed on the Ativan CIWA protocol. Patient was also placed on folic acid, thiamine, multivitamin and IV fluids. Patient was monitored and followed. Patient improved slowly during the hospitalization. The Ativan protocol had to be adjusted secondary to patient's tremors. Patient improved clinically and tremors had improved significantly by day of discharge. Patient be discharged to the inpatient psychiatric unit.   #2 hyponatremia Patient was noted to be hyponatremic on admission with a sodium was low was 125. It was felt patient's hyponatremia was likely secondary to beer potomania. Patient was hydrated with IV fluids, and his renal function improved on a daily basis, such that by day of discharge patient's hyponatremia had resolved.  #3 hypokalemia Repleted.  #4 transaminitis On admission patient was noted to have a transaminitis. Acute hepatitis panel which was done was negative. It was felt this was likely secondary to chronic alcohol use. Abdominal ultrasound which was done was consistent with a hepatic steatosis. Patient was monitored. LFTs trended down. Outpatient follow-up.   #5 schizoaffective disorder/suicidal ideations Patient was admitted with complaints of auditory hallucinations as well as suicidal ideation. Psychiatric consultation was obtained. Patient was seen in consultation by psychiatry and psychiatry recommended inpatient psychiatry, once patient was medically stable. Patient was maintained on the Ativan CIWA, protocol and improved on a daily basis. Per psychiatry patient was continued on his home regimen of Zoloft, Seroquel, Neurontin, Tegretol. Psychiatry recommended lithium 300 mg twice a day, which was started during the hospitalization. Patient improved clinically and will be discharged to [facility] in stable  condition for further management of his  schizoaffective disorder and suicidal ideations.   #6 pancytopenia Likely secondary to chronic alcohol abuse. No signs or symptoms of bleeding. Urinalysis which was was negative. Patient did not have any respiratory symptoms. Patient's counts improved such that his white count had normalized and platelet function had normalized. Hemoglobin remained stable at 10.8. Outpatient follow-up.   #7 severe protein calorie malnutrition Secondary to heavy alcohol abuse. Patient was placed on the regular diet as well as nutritional supplementation which he tolerated.   #8 back pain after fall in the setting of alcohol use X-rays of the sacral lumbar spine were done which were negative for any acute fracture. Patient was placed on pain management with improvement in his pain. Patient was seen by PT.  #9 seizures No active seizures during the hospitalization. Patient was kept on his home regimen of Tegretol.   Procedures:  CT head 10/18/2014  X-ray of the sacrum 10/18/2014  X-ray of the L-spine 10/18/2014  Right upper quadrant ultrasound 10/19/2014    Consultations:  Psychiatry: Dr. Adele Schilder 10/22/2014  Discharge Exam: Filed Vitals:   10/24/14 1306  BP: 127/87  Pulse: 105  Temp: 97.9 F (36.6 C)  Resp: 18    General: Significantly improved tremors. Cardiovascular: RRR Respiratory: CTAB  Discharge Instructions   Discharge Instructions    Diet general    Complete by:  As directed           Current Discharge Medication List    START taking these medications   Details  docusate sodium (COLACE) 100 MG capsule Take 1 capsule (100 mg total) by mouth 2 (two) times daily. Qty: 10 capsule, Refills: 0    feeding supplement, ENSURE COMPLETE, (ENSURE COMPLETE) LIQD Take 237 mLs by mouth 2 (two) times daily between meals.    folic acid (FOLVITE) 1 MG tablet Take 1 tablet (1 mg total) by mouth daily.    lithium carbonate 300 MG capsule Take 1  capsule (300 mg total) by mouth 2 (two) times daily with a meal.    LORazepam (ATIVAN) 1 MG tablet Take 1 tablet (1 mg total) by mouth every 4 (four) hours as needed for anxiety (CIWA-AR > 8-OR-withdrawal symptoms:anxiety, agitation, insomnia, diaphoresis, nausea, vomiting, tremors, tachycardia, or hypertension.). Use every 4 hours as needed for next 2 days, then every 8 hours as needed. Refills: 0    Multiple Vitamin (MULTIVITAMIN WITH MINERALS) TABS tablet Take 1 tablet by mouth daily.    nicotine (NICODERM CQ - DOSED IN MG/24 HOURS) 21 mg/24hr patch Place 1 patch (21 mg total) onto the skin daily. Qty: 28 patch, Refills: 0    oxyCODONE (OXY IR/ROXICODONE) 5 MG immediate release tablet Take 1 tablet (5 mg total) by mouth every 6 (six) hours as needed for severe pain. Qty: 15 tablet, Refills: 0    thiamine 100 MG tablet Take 1 tablet (100 mg total) by mouth daily.      CONTINUE these medications which have NOT CHANGED   Details  carbamazepine (TEGRETOL) 200 MG tablet Take 1 tablet (200 mg total) by mouth at bedtime. Qty: 30 tablet, Refills: 1    gabapentin (NEURONTIN) 300 MG capsule Take 300 mg by mouth at bedtime.    naproxen sodium (ANAPROX) 220 MG tablet Take 220 mg by mouth daily as needed (pain).    QUEtiapine (SEROQUEL) 100 MG tablet Take 1 tablet (100 mg total) by mouth 2 (two) times daily. Qty: 60 tablet, Refills: 1    sertraline (ZOLOFT) 50 MG tablet Take 1 tablet (50 mg  total) by mouth daily. Qty: 30 tablet, Refills: 1       Allergies  Allergen Reactions  . Wellbutrin [Bupropion] Other (See Comments)    Makes me feel like I'm have a seizure      The results of significant diagnostics from this hospitalization (including imaging, microbiology, ancillary and laboratory) are listed below for reference.    Significant Diagnostic Studies: Dg Lumbar Spine Complete  10/18/2014   CLINICAL DATA:  Fall today with lumbar back pain and tailbone pain. No radiation of  symptoms. Initial encounter.  EXAM: LUMBAR SPINE - COMPLETE 4+ VIEW  COMPARISON:  None.  FINDINGS: Alignment is anatomic. Vertebral body and disc space height are maintained. No definite pars defects. No endplate degenerative changes.  IMPRESSION: Normal exam.   Electronically Signed   By: Lorin Picket M.D.   On: 10/18/2014 18:49   Dg Sacrum/coccyx  10/18/2014   CLINICAL DATA:  Fall down steps with lumbar back pain and tailbone pain, no radiation of symptoms. Initial encounter.  EXAM: SACRUM AND COCCYX - 2+ VIEW  COMPARISON:  None.  FINDINGS: Sacrum and coccyx appear grossly intact.  IMPRESSION: No acute findings.   Electronically Signed   By: Lorin Picket M.D.   On: 10/18/2014 18:51   Ct Head Wo Contrast  10/18/2014   CLINICAL DATA:  Hallucinations for 1 month.  EXAM: CT HEAD WITHOUT CONTRAST  TECHNIQUE: Contiguous axial images were obtained from the base of the skull through the vertex without intravenous contrast.  COMPARISON:  Head CT scan 10/29/2011.  FINDINGS: The brain appears normal without hemorrhage, infarct, mass lesion, mass effect, midline shift or abnormal extra-axial fluid collection. No hydrocephalus or pneumocephalus. The calvarium is intact. Imaged paranasal sinuses and mastoid air cells are clear.  IMPRESSION: Negative head CT.   Electronically Signed   By: Inge Rise M.D.   On: 10/18/2014 20:29   US Abdomen Limited Ruq  10/19/2014   CLINICAL DATA:  Elevated liver enzymes  EXAM: US ABDOMEN LIMITED - RIGHT UPPER QUADRANT  COMPARISON:  February 15, 2014  FINDINGS: Gallbladder:  No gallstones or wall thickening visualized. There is no pericholecystic fluid. No sonographic Murphy sign noted.  Common bile duct:  Diameter: 3 mm. There is no intrahepatic or extrahepatic biliary duct dilatation.  Liver:  No focal lesion identified.  Liver echogenicity is increased.  IMPRESSION: Increased liver echogenicity, a finding most likely indicative of hepatic steatosis. While no focal liver lesions  are identified, it must be cautioned that the sensitivity of ultrasound for focal liver lesions is diminished in this circumstance. Study otherwise unremarkable.   Electronically Signed   By: Lowella Grip III M.D.   On: 10/19/2014 10:43    Microbiology: Recent Results (from the past 240 hour(s))  MRSA PCR Screening     Status: None   Collection Time: 10/19/14  7:00 AM  Result Value Ref Range Status   MRSA by PCR NEGATIVE NEGATIVE Final    Comment:        The GeneXpert MRSA Assay (FDA approved for NASAL specimens only), is one component of a comprehensive MRSA colonization surveillance program. It is not intended to diagnose MRSA infection nor to guide or monitor treatment for MRSA infections.   Clostridium Difficile by PCR     Status: None   Collection Time: 10/19/14  7:45 PM  Result Value Ref Range Status   C difficile by pcr NEGATIVE NEGATIVE Final     Labs: Basic Metabolic Panel:  Recent Labs Lab 10/18/14  2143  10/19/14 0800  10/20/14 0337 10/21/14 0534 10/22/14 0425 10/23/14 0543 10/24/14 0610  NA  --   < > 125*  < > 132* 131* 134* 134* 136  K  --   < > 3.5  < > 3.6 3.9 4.3 4.1 4.5  CL  --   < > 87*  < > 98 99 104 101 102  CO2  --   < > 27  < > 25 25 22 22 24   GLUCOSE  --   < > 87  < > 90 99 107* 101* 93  BUN  --   < > 10  < > 12 8 12 21 17   CREATININE  --   < > 0.62  < > 0.66 0.63 0.70 0.76 0.73  CALCIUM  --   < > 8.4  < > 9.1 8.8 9.2 9.3 9.5  MG 2.0  --  1.6  --   --  1.8  --   --   --   PHOS 2.9  --  2.9  --   --   --   --   --   --   < > = values in this interval not displayed. Liver Function Tests:  Recent Labs Lab 10/20/14 0337 10/21/14 0534 10/22/14 0425 10/23/14 0543 10/24/14 0610  AST 335* 424* 267* 216* 197*  ALT 253* 319* 289* 276* 283*  ALKPHOS 103 111 102 108 109  BILITOT 1.1 1.0 0.7 0.4 0.5  PROT 6.1 6.3 6.3 6.2 6.5  ALBUMIN 3.8 3.7 3.8 3.7 3.8    Recent Labs Lab 10/18/14 1800  LIPASE 48    Recent Labs Lab 10/18/14 1813   AMMONIA 29   CBC:  Recent Labs Lab 10/18/14 1845  10/20/14 0337 10/21/14 0534 10/22/14 0425 10/23/14 0543 10/24/14 0610  WBC 3.4*  < > 2.8* 3.4* 4.0 4.1 4.9  NEUTROABS 1.9  --   --   --   --   --   --   HGB 12.7*  < > 10.9* 11.0* 11.3* 11.0* 10.8*  HCT 35.6*  < > 31.8* 31.8* 33.7* 32.8* 32.4*  MCV 91.3  < > 94.6 95.5 96.0 97.6 98.2  PLT 100*  < > 87* 92* 123* 151 202  < > = values in this interval not displayed. Cardiac Enzymes: No results for input(s): CKTOTAL, CKMB, CKMBINDEX, TROPONINI in the last 168 hours. BNP: BNP (last 3 results) No results for input(s): BNP in the last 8760 hours.  ProBNP (last 3 results) No results for input(s): PROBNP in the last 8760 hours.  CBG: No results for input(s): GLUCAP in the last 168 hours.     SignedIrine Seal MD [facility] 10/24/2014, 4:35 PM

## 2014-10-24 NOTE — Progress Notes (Signed)
Pt AUDIT score 30. Alcohol Use Education sheet reviewed and given to pt. Physician notified.

## 2014-10-24 NOTE — Progress Notes (Signed)
Clinical Social Work  Patient was discussed during [place] LOS meeting and medical team feels patient is medically stable to DC. CSW spoke with attending MD who is aware and agreeable for CSW to search for inpatient placement. CSW rounded with psych MD to meet with patient. Patient reports that alcohol withdrawal is getting better but he continues to feel depressed with suicidal ideations. Patient wants inpatient treatment is willing to go on voluntary basis.  CSW contacted the following facilities re: bed availability:  [facility]- denied on 3/8  [facility]) will review chart and call CSW with determination  [facility]- L/M with admissions  [facility]- unsure of bed availability but referral faxed  [facility]- available beds. Referral faxed.  CSW will continue to follow.  [place], [place] [phone]

## 2014-10-24 NOTE — Progress Notes (Signed)
Pt presents to [facility] Adult Unit. Affect/ mood depressed and anxious. Pt reports daily alcohol use of 3 or more 24oz malt liquors and is now requesting detox. "I don't want to drink anymore, I need to get stabilized and go back home".  Pt had a recent fall, down a flight of steps, was treated in ED and hospitalized for medical issues. Since fall pt reports generalized weakness and back pain, see MAR. Multiple bruises noted to back, legs and thighs. Pt gait is unsteady, requiring a walker and standby assist with ADLs.  -SI/HI, verbally contracts for safety. +A/[place] " hear them laughing at me, see shadows of people and aliens". Reports PTSD related to fiancee committing suicide by gun shot in front of him, 31yrs ago. "I need to get my life back in order". Emotional support and encouragement given. Pt admitted for evaluation, stabilization and reduction of baseline.

## 2014-10-24 NOTE — Progress Notes (Addendum)
Physical Therapy Treatment Patient Details Name: [name] MRN: [id number] DOB: 1970/09/02 Today's Date: 11/21/14    History of Present Illness 44 yo male adm 10/18/14  with hyponatremia/beer potomania; PMHx: schizoaffective disorder, hx ETOH abuse and depression, PTSD, back pain    PT Comments    Pt progressing.   Follow Up Recommendations  Other (comment);No PT follow up (pt planning to go to inpt psych)     Equipment Recommendations  Rolling walker with 5" wheels    Recommendations for Other Services       Precautions / Restrictions Precautions Precautions: Fall    Mobility  Bed Mobility Overal bed mobility: Needs Assistance Bed Mobility: Supine to Sit     Supine to sit: Supervision     General bed mobility comments: incr time  Transfers Overall transfer level: Needs assistance Equipment used: Rolling walker (2 wheeled) Transfers: Sit to/from Stand Sit to Stand: Min guard;Min assist         General transfer comment: verbal cues for hand placement and safety  Ambulation/Gait Ambulation/Gait assistance: Min assist;Min guard Ambulation Distance (Feet): 120 Feet Assistive device: Rolling walker (2 wheeled) Gait Pattern/deviations: Decreased step length - right;Decreased step length - left   Gait velocity interpretation: Below normal speed for age/gender General Gait Details: facilitation to incr wt shift and step length; pt continue to have tremors during mobility but improving   Stairs            Wheelchair Mobility    Modified Rankin (Stroke Patients Only)       Balance                                    Cognition Arousal/Alertness: Awake/alert Behavior During Therapy: Anxious Overall Cognitive Status: Within Functional Limits for tasks assessed                      Exercises General Exercises - Lower Extremity Long Arc Quad: Strengthening;Both;10 reps Toe Raises: Strengthening;Both;10 reps Heel Raises:  Strengthening;Both;10 reps    General Comments        Pertinent Vitals/Pain Pain Assessment: 0-10 Pain Score: 3  Pain Location: low back Pain Descriptors / Indicators: Aching Pain Intervention(s): Limited activity within patient's tolerance;Monitored during session    Home Living                      Prior Function            PT Goals (current goals can now be found in the care plan section) Acute Rehab PT Goals Patient Stated Goal: to be safe at home PT Goal Formulation: With patient Time For Goal Achievement: 11/05/14 Potential to Achieve Goals: Good Progress towards PT goals: Progressing toward goals    Frequency  Min 3X/week    PT Plan Current plan remains appropriate    Co-evaluation             End of Session   Activity Tolerance: Patient tolerated treatment well Patient left: in chair;with call bell/phone within reach;with nursing/sitter in room     Time: 4696-2952 PT Time Calculation (min) (ACUTE ONLY): 18 min  Charges:  $Gait Training: 8-22 mins                    G Codes:      [name] 11/21/2014, 1:04 PM

## 2014-10-24 NOTE — Consult Note (Signed)
Psychiatry Consult follow-up note  Reason for Consult:  Psychosis and suicidal thoughts Referring Physician:  Dr. Doyle Askew Patient Identification: [name] MRN:  [id number] Principal Diagnosis: Alcohol dependence, schizoaffective disorder Diagnosis:   Patient Active Problem List   Diagnosis Date Noted  . Chronic posttraumatic stress disorder [F43.12]   . Protein-calorie malnutrition, severe [E43] 10/19/2014  . Hyponatremia [E87.1] 10/18/2014  . Elevated LFTs [R79.89]   . Alcohol abuse with physiological dependence [F10.20]   . Hallucinations, visual [R44.1]   . Noncompliance with medication regimen [Z91.14]   . Hallucinations [R44.3] 07/07/2014  . Paranoia [F22] 07/07/2014  . Abnormal liver enzymes [R74.8] 02/14/2014  . Cannabis abuse [F12.10] 12/30/2013  . Schizoaffective disorder [F25.9] 12/29/2013  . Post traumatic stress disorder (PTSD) [F43.10] 11/03/2011  . Alcohol dependence [F10.20] 11/03/2011  . Active smoker [Z72.0] 06/30/2011    Total Time spent with patient: 20 minutes  Subjective:   [name] is a 44 y.o. male patient admitted with alcohol intoxication and having suicidal thoughts.[name]  HPI:  Patient is 44 year old Caucasian single man who was admitted on the medical floor because of alcohol intoxication and having auditory or visual hallucination.  He also mentions suicidal thoughts and plan to kill himself.  He's been drinking heavily in past one week.  He admitted that he's been more depressed and feeling hopeless and worthless and wanted to kill himself by drinking.  He was last seen in January 16 in the emergency room and then he was sent to [facility] where he stayed for one week.  He mentioned that his medicine is not working very well.  He's been seeing images and having strong suicidal thoughts.  His blood alcohol level was 362 and he has high liver enzymes.  He is on Tegretol, Zoloft and Seroquel.  Patient lives by himself.  He has a brother and  sister that he does not see his family as much.  He admitted poor sleep, racing thoughts, paranoia and wanted to kill himself by drinking.  Patient appears very restless and tremulous.  Patient has history of seizures due to alcohol withdrawal.   Interval history: Patient seen with case manager Sindy Messing, LCSW for psychiatric consultation follow-up. Patient is calm, cooperative, awake, alert and oriented to time place and person. Patient continued to report symptoms of depression, anxiety and posttraumatic stress disorder. Patient has complaining about back pain and seeking for medication management. Patient drinking one case of beer daily over 30 days and blood alcohol level 362 and urine drug screen is positive for marijuana on admission and continue to have sweating, tremors and unable to walk. Patient is currently alcohol detox treatment and needed inpatient psychiatric hospitalization for appropriate medication management for depression anxiety and passive suicidal ideation.  Patient stated he was seeing his ex-girlfriend Killing in front of him about 5 years ago in [place]. Patient has visual hallucinations of seeing shadows. Patient currently depressed, anxious but denies active suicidal ideation, intention or plans. Patient has no family support and lives by himself on disability. Patient was [facility] at [facility] for outpatient psychiatric medication management and has been partially compliant with medication.   Past Medical History:  Past Medical History  Diagnosis Date  . Depression   . Psychosis   . PTSD (post-traumatic stress disorder)   . Seizure disorder     related to etoh seizure  . Alcohol abuse   . Schizoaffective disorder   . Anxiety     Past Surgical History  Procedure Laterality Date  .  Foot surgery      right  . Hand surgery     Family History:  Family History  Problem Relation Age of Onset  . Alcoholism Mother   . CAD Father   . Depression Brother    Social  History:  History  Alcohol Use  . 50.4 oz/week  . 84 Cans of beer per week    Comment: 6-8 cans beer per day, 24 ounce cans for past 1 month     History  Drug Use  . 1.00 per week  . Special: Marijuana    Comment: THC 2 to 3 times per month    History   Social History  . Marital Status: Single    Spouse Name: N/A  . Number of Children: N/A  . Years of Education: N/A   Social History Main Topics  . Smoking status: Current Every Day Smoker -- 1.50 packs/day for 24 years    Types: Cigarettes  . Smokeless tobacco: Never Used  . Alcohol Use: 50.4 oz/week    84 Cans of beer per week     Comment: 6-8 cans beer per day, 24 ounce cans for past 1 month  . Drug Use: 1.00 per week    Special: Marijuana     Comment: THC 2 to 3 times per month  . Sexual Activity: Yes   Other Topics Concern  . None   Social History Narrative   Additional Social History:                          Allergies:   Allergies  Allergen Reactions  . Wellbutrin [Bupropion] Other (See Comments)    Makes me feel like I'm have a seizure    Vitals: Blood pressure 118/82, pulse 103, temperature 97.8 F (36.6 C), temperature source Oral, resp. rate 18, height 6' (1.829 m), weight 87.9 kg (193 lb 12.6 oz), SpO2 94 %.  Risk to Self: Is patient at risk for suicide?: Yes Risk to Others:   Prior Inpatient Therapy:   Prior Outpatient Therapy:    Current Facility-Administered Medications  Medication Dose Route Frequency Provider Last Rate Last Dose  . carbamazepine (TEGRETOL) tablet 200 mg  200 mg Oral QHS Toy Baker, MD   200 mg at 10/23/14 2253  . docusate sodium (COLACE) capsule 100 mg  100 mg Oral BID Toy Baker, MD   100 mg at 10/24/14 0913  . feeding supplement (ENSURE COMPLETE) (ENSURE COMPLETE) liquid 237 mL  237 mL Oral BID BM Dorann Ou, RD   237 mL at 10/24/14 0914  . folic acid (FOLVITE) tablet 1 mg  1 mg Oral Daily Theodis Blaze, MD   1 mg at 10/24/14 0914  .  gabapentin (NEURONTIN) capsule 300 mg  300 mg Oral QHS Toy Baker, MD   300 mg at 10/23/14 2253  . HYDROmorphone (DILAUDID) injection 0.5 mg  0.5 mg Intravenous Q2H PRN Theodis Blaze, MD   0.5 mg at 10/24/14 0917  . lithium carbonate capsule 300 mg  300 mg Oral BID WC Eugenie Filler, MD   300 mg at 10/24/14 0900  . LORazepam (ATIVAN) tablet 1 mg  1 mg Oral Q4H PRN Theodis Blaze, MD   1 mg at 10/24/14 0431   Or  . LORazepam (ATIVAN) injection 1 mg  1 mg Intravenous Q4H PRN Theodis Blaze, MD   1 mg at 10/24/14 0917  . metoprolol (LOPRESSOR) injection 5 mg  5 mg Intravenous Q6H PRN Theodis Blaze, MD      . multivitamin with minerals tablet 1 tablet  1 tablet Oral Daily Theodis Blaze, MD   1 tablet at 10/24/14 0915  . nicotine (NICODERM CQ - dosed in mg/24 hours) patch 21 mg  21 mg Transdermal Daily Theodis Blaze, MD   21 mg at 10/24/14 0915  . ondansetron (ZOFRAN) tablet 4 mg  4 mg Oral Q6H PRN Toy Baker, MD       Or  . ondansetron (ZOFRAN) injection 4 mg  4 mg Intravenous Q6H PRN Toy Baker, MD      . oxyCODONE (Oxy IR/ROXICODONE) immediate release tablet 5 mg  5 mg Oral Q6H PRN Theodis Blaze, MD   5 mg at 10/22/14 0440  . QUEtiapine (SEROQUEL) tablet 100 mg  100 mg Oral Daily Toy Baker, MD   100 mg at 10/24/14 0916  . QUEtiapine (SEROQUEL) tablet 300 mg  300 mg Oral QHS Toy Baker, MD   300 mg at 10/23/14 2253  . sertraline (ZOLOFT) tablet 50 mg  50 mg Oral Daily Toy Baker, MD   50 mg at 10/24/14 0913  . sodium chloride 0.9 % injection 3 mL  3 mL Intravenous Q12H Toy Baker, MD   3 mL at 10/24/14 0916  . thiamine (VITAMIN B-1) tablet 100 mg  100 mg Oral Daily Tatyana Kirichenko, PA-C   100 mg at 10/24/14 0913    Musculoskeletal: Strength & Muscle Tone: within normal limits Gait & Station: unsteady Patient leans: Front and Backward  Psychiatric Specialty Exam: Physical Exam  ROS  Blood pressure 118/82, pulse 103, temperature 97.8  F (36.6 C), temperature source Oral, resp. rate 18, height 6' (1.829 m), weight 87.9 kg (193 lb 12.6 oz), SpO2 94 %.Body mass index is 26.28 kg/(m^2).  General Appearance: Casual and Disheveled  Eye Contact::  Fair  Speech:  Slow  Volume:  Decreased  Mood:  Anxious, Depressed, Dysphoric and Hopeless  Affect:  Constricted and Depressed  Thought Process:  Intact  Orientation:  Full (Time, Place, and Person)  Thought Content:  Hallucinations: Auditory Visual, Paranoid Ideation and Rumination  Suicidal Thoughts:  Yes.  with intent/plan  Homicidal Thoughts:  No  Memory:  Immediate;   Fair Recent;   Fair Remote;   Fair  Judgement:  Fair  Insight:  Lacking  Psychomotor Activity:  Restlessness and Tremor  Concentration:  Fair  Recall:  [employer] of Knowledge:Fair  Language: Fair  Akathisia:  No  Handed:  Right  AIMS (if indicated):     Assets:  Communication Skills Housing  ADL's:  Intact  Cognition: Impaired,  Mild  Sleep:      Medical Decision Making: Review of Psycho-Social Stressors (1), Review or order clinical lab tests (1), Review and summation of old records (2), Established Problem, Worsening (2), New Problem, with no additional work-up planned (3), Review of Medication Regimen & Side Effects (2) and Review of New Medication or Change in Dosage (2)  Treatment Plan Summary: Daily contact with patient to assess and evaluate symptoms and progress in treatment and Medication management,  Consultation liaison services will follow him to monitor his progress.   Patient will require setter for his safety.  Plan:  Continue detox protocol for alcohol with Ativan Continue current medication management  Zoloft 50 mg daily Seroquel 100 mg daily morning 300 mg at bedtime Gabapentin 300 mg at bedtime  Tegretol 200 mg at bedtime Monitor for therapeutic  levels of Tegretol - in process Recommend psychiatric Inpatient admission when medically cleared. continue sitter for  safety.   Berklie Dethlefs,[name] 10/24/2014 12:41 PM

## 2014-10-24 NOTE — Progress Notes (Signed)
Pt picked up by [facility]. Pt left unit via wheelchair with pt belonging bag; sitter accompanied pt to main lobby.

## 2014-10-24 NOTE — Tx Team (Addendum)
Initial Interdisciplinary Treatment Plan   PATIENT STRESSORS: Health problems Substance abuse Traumatic event   PATIENT STRENGTHS: Ability for insight Communication skills Motivation for treatment/growth Supportive family/friends   PROBLEM LIST: Problem List/Patient Goals Date to be addressed Date deferred Reason deferred Estimated date of resolution  Substance Abuse "I don't want to drink anymore' 10/24/14   At d/c  Depression"Getting my life back in order' 10/24/14   At d/c  Psychosis 10/24/14   At d/c  Increased risk for suicide 10/24/14   At d/c                                 DISCHARGE CRITERIA:  Improved stabilization in mood, thinking, and/or behavior Medical problems require only outpatient monitoring Need for constant or close observation no longer present Safe-care adequate arrangements made Withdrawal symptoms are absent or subacute and managed without 24-hour nursing intervention  PRELIMINARY DISCHARGE PLAN: Outpatient therapy Return to previous living arrangement  PATIENT/FAMIILY INVOLVEMENT: This treatment plan has been presented to and reviewed with the patient, Veryl Winemiller.  The patient and family have been given the opportunity to ask questions and make suggestions.  [name] 10/24/2014, 10:20 PM

## 2014-10-24 NOTE — Progress Notes (Signed)
Report called to [facility].

## 2014-10-25 ENCOUNTER — Encounter (HOSPITAL_COMMUNITY): Payer: Self-pay | Admitting: Psychiatry

## 2014-10-25 DIAGNOSIS — R45851 Suicidal ideations: Secondary | ICD-10-CM

## 2014-10-25 DIAGNOSIS — F1099 Alcohol use, unspecified with unspecified alcohol-induced disorder: Secondary | ICD-10-CM

## 2014-10-25 DIAGNOSIS — F258 Other schizoaffective disorders: Principal | ICD-10-CM

## 2014-10-25 LAB — CARBAMAZEPINE LEVEL, TOTAL: Carbamazepine Lvl: 3.3 ug/mL — ABNORMAL LOW (ref 4.0–12.0)

## 2014-10-25 LAB — LITHIUM LEVEL: Lithium Lvl: 0.21 mmol/L — ABNORMAL LOW (ref 0.80–1.40)

## 2014-10-25 LAB — TSH: TSH: 1.802 u[IU]/mL (ref 0.350–4.500)

## 2014-10-25 MED ORDER — ZOLPIDEM TARTRATE 10 MG PO TABS
10.0000 mg | ORAL_TABLET | Freq: Every evening | ORAL | Status: DC | PRN
Start: 2014-10-25 — End: 2014-10-30
  Administered 2014-10-25 – 2014-10-29 (×5): 10 mg via ORAL
  Filled 2014-10-25 (×5): qty 1

## 2014-10-25 MED ORDER — TRAMADOL HCL 50 MG PO TABS
50.0000 mg | ORAL_TABLET | Freq: Four times a day (QID) | ORAL | Status: DC | PRN
  Administered 2014-10-25 – 2014-10-30 (×17): 50 mg via ORAL
  Filled 2014-10-25 (×17): qty 1

## 2014-10-25 NOTE — Progress Notes (Signed)
D: Patient denies SI/HI ; patient reports seeing shadows and hearing voices laughing at him; patient is complaining of pain A: Monitored q 15 minutes; patient encouraged to attend groups; patient educated about medications; patient given medications per physician orders; patient encouraged to express feelings and/or concerns  R: Patient is cooperative and pleasant; patient sits in the dayroom and engages minimally with staff and peers; patient is assertive about what he needs; patient was able to set goal to talk with staff 1:1 when having feelings of SI; patient is taking medications as prescribed and tolerating medications

## 2014-10-25 NOTE — H&P (Signed)
Psychiatric Admission Assessment Adult  Patient Identification: [name] MRN:  [id number] Date of Evaluation:  10/25/2014 Chief Complaint:  Schizoaffective Disorder Alcohol Use Disorder Principal Diagnosis: Schizophrenia, schizoaffective, chronic with acute exacerbation Diagnosis:   Patient Active Problem List   Diagnosis Date Noted  . Schizophrenia, schizoaffective, chronic with acute exacerbation [F25.8] 10/24/2014  . Alcohol intoxication [F10.129]   . Chronic posttraumatic stress disorder [F43.12]   . Protein-calorie malnutrition, severe [E43] 10/19/2014  . Hyponatremia [E87.1] 10/18/2014  . Elevated LFTs [R79.89]   . Alcohol abuse with physiological dependence [F10.20]   . Hallucinations, visual [R44.1]   . Noncompliance with medication regimen [Z91.14]   . Hallucinations [R44.3] 07/07/2014  . Paranoia [F22] 07/07/2014  . Abnormal liver enzymes [R74.8] 02/14/2014  . Cannabis abuse [F12.10] 12/30/2013  . Schizoaffective disorder [F25.9] 12/29/2013  . Post traumatic stress disorder (PTSD) [F43.10] 11/03/2011  . Alcohol dependence [F10.20] 11/03/2011  . Active smoker [Z72.0] 06/30/2011   History of Present Illness: [name], 44 y.o. male patient admitted with alcohol intoxication and having suicidal thoughts on the medical floor at [facility].  He was having AVH, suicidal thoughts with a plan to kill self.  BAC was 362 with elevated liver enzymes.  He states that his depression had been worsening in the past few months and he started having suicidal thoughts an images.  Reports that his meds were not working very well.  He is on Tegretol, Zoloft and Seroquel but feel they don't control AH (voices that "laugh and degrade him") sufficiently. Patient lives by himself. He has a brother and sister that he does not see his family as much. He admitted poor sleep, racing thoughts, paranoia and wanted to kill himself by drinking.  Patient has history of seizures due to alcohol  withdrawal.  Today, patient verified above info.  He reported being heavily intoxicated and falling down a flight of steps and hurting his back, "I had to learn to walk again."  He states that he had been diagnosed with schizoaffective disorder since he was 44 years old but hearing voices as early as his late teens.  He also reports PTSD being witness to his fiancee committing suicide.   He has been to [facility], [facility] and [facility] as an inpatient.  HPI Elements:  Location:ETOH intoxication Quality: poor. Severity: unable to function. Timing: severe. Duration: 2 weeks.  Associated Signs/Symptoms: Depression Symptoms:  depressed mood, difficulty concentrating, hopelessness, suicidal attempt, anxiety, panic attacks, (Hypo) Manic Symptoms:  Irritable Mood, Labiality of Mood, Anxiety Symptoms:  Social Anxiety, Psychotic Symptoms:  Hallucinations: Auditory Visual PTSD Symptoms: Had a traumatic exposure:  witnessed fiancee commit suicide Total Time spent with patient: 30 minutes  Past Medical History:  Past Medical History  Diagnosis Date  . Depression   . Psychosis   . PTSD (post-traumatic stress disorder)   . Seizure disorder     related to etoh seizure  . Alcohol abuse   . Schizoaffective disorder   . Anxiety     Past Surgical History  Procedure Laterality Date  . Foot surgery      right  . Hand surgery     Family History:  Family History  Problem Relation Age of Onset  . Alcoholism Mother   . CAD Father   . Depression Brother    Social History:  History  Alcohol Use  . 50.4 oz/week  . 84 Cans of beer per week    Comment: 6-8 cans beer per day, 24 ounce cans for past 1  month     History  Drug Use  . 1.00 per week  . Special: Marijuana    Comment: THC 2 to 3 times per month    History   Social History  . Marital Status: Single    Spouse Name: N/A  . Number of Children: N/A  . Years of Education: N/A   Social History Main Topics  .  Smoking status: Current Every Day Smoker -- 1.50 packs/day for 24 years    Types: Cigarettes  . Smokeless tobacco: Never Used  . Alcohol Use: 50.4 oz/week    84 Cans of beer per week     Comment: 6-8 cans beer per day, 24 ounce cans for past 1 month  . Drug Use: 1.00 per week    Special: Marijuana     Comment: THC 2 to 3 times per month  . Sexual Activity: Yes   Other Topics Concern  . None   Social History Narrative   Additional Social History:    Pain Medications: denies prior to fall Prescriptions: denies abuse Over the Counter: denies History of alcohol / drug use?: Yes Negative Consequences of Use: Financial, Scientist, research (physical sciences), Personal relationships, Work / School Withdrawal Symptoms: Tremors, Seizures, Nausea / Vomiting, Sweats, Irritability Onset of Seizures: "last one 4 years ago withdrawal" Name of Substance 1: alcohol 1 - Age of First Use: 44 years old 1 - Amount (size/oz):  6-24oz beers 1 - Frequency: daily 1 - Duration: "years off and on" 1 - Last Use / Amount: "last weeK'                   Musculoskeletal: Strength & Muscle Tone: within normal limits slowed but steady with walker Gait & Station: normal Patient leans: N/A  Psychiatric Specialty Exam: Physical Exam  Vitals reviewed. Psychiatric: His mood appears anxious. He exhibits a depressed mood.    Review of Systems  Psychiatric/Behavioral: Positive for depression and hallucinations (auditory). The patient is nervous/anxious and has insomnia.   All other systems reviewed and are negative.   Blood pressure 116/77, pulse 98, temperature 97.8 F (36.6 C), temperature source Oral, resp. rate 20, height 6' (1.829 m), weight 89.812 kg (198 lb).Body mass index is 26.85 kg/(m^2).  General Appearance: Disheveled  Eye Sport and exercise psychologist::  Fair  Speech:  Clear and Coherent  Volume:  Normal  Mood:  Anxious and Depressed  Affect:  Flat  Thought Process:  Intact  Orientation:  Full (Time, Place, and Person)  Thought  Content:  Rumination  Suicidal Thoughts:  No  Homicidal Thoughts:  No  Memory:  Immediate;   Good Recent;   Good Remote;   Good  Judgement:  Good  Insight:  Good  Psychomotor Activity:  Normal  Concentration:  Good  Recall:  Good  Fund of Knowledge:Good  Language: Good  Akathisia:  Negative  Handed:  Right  AIMS (if indicated):     Assets:  Desire for Improvement Social Support Talents/Skills  ADL's:  Intact  Cognition: WNL  Sleep:  Number of Hours: 55   Risk to Self: Is patient at risk for suicide?: No Risk to Others:   Prior Inpatient Therapy:   Prior Outpatient Therapy:    Alcohol Screening: 1. How often do you have a drink containing alcohol?: 4 or more times a week 2. How many drinks containing alcohol do you have on a typical day when you are drinking?: 10 or more 3. How often do you have six or more drinks on one occasion?:  Daily or almost daily Preliminary Score: 8 4. How often during the last year have you found that you were not able to stop drinking once you had started?: Monthly 5. How often during the last year have you failed to do what was normally expected from you becasue of drinking?: Monthly 6. How often during the last year have you needed a first drink in the morning to get yourself going after a heavy drinking session?: Weekly 7. How often during the last year have you had a feeling of guilt of remorse after drinking?: Daily or almost daily 8. How often during the last year have you been unable to remember what happened the night before because you had been drinking?: Weekly 9. Have you or someone else been injured as a result of your drinking?: No 10. Has a relative or friend or a doctor or another health worker been concerned about your drinking or suggested you cut down?: Yes, during the last year Alcohol Use Disorder Identification Test Final Score (AUDIT): 30 Brief Intervention: MD notified of score 20 or above  Allergies:   Allergies  Allergen  Reactions  . Wellbutrin [Bupropion] Other (See Comments)    Makes me feel like I'm have a seizure   Lab Results:  Results for orders placed or performed during the hospital encounter of 10/24/14 (from the past 48 hour(s))  Lithium level     Status: Abnormal   Collection Time: 10/25/14  6:38 AM  Result Value Ref Range   Lithium Lvl 0.21 (L) 0.80 - 1.40 mmol/L    Comment: Performed at [facility]  Carbamazepine level, total     Status: Abnormal   Collection Time: 10/25/14  6:38 AM  Result Value Ref Range   Carbamazepine Lvl 3.3 (L) 4.0 - 12.0 ug/mL    Comment: Performed at [facility]  TSH     Status: None   Collection Time: 10/25/14  6:38 AM  Result Value Ref Range   TSH 1.802 0.350 - 4.500 uIU/mL    Comment: Performed at [facility]   Current Medications: Current Facility-Administered Medications  Medication Dose Route Frequency Provider Last Rate Last Dose  . acetaminophen (TYLENOL) tablet 650 mg  650 mg Oral Q6H PRN Laverle Hobby, PA-C      . alum & mag hydroxide-simeth (MAALOX/MYLANTA) 200-200-20 MG/5ML suspension 30 mL  30 mL Oral Q4H PRN Laverle Hobby, PA-C      . carbamazepine (TEGRETOL) tablet 200 mg  200 mg Oral QHS Laverle Hobby, PA-C   200 mg at 10/24/14 2210  . docusate sodium (COLACE) capsule 100 mg  100 mg Oral BID Laverle Hobby, PA-C   100 mg at 10/25/14 0817  . feeding supplement (ENSURE COMPLETE) (ENSURE COMPLETE) liquid 237 mL  237 mL Oral BID BM Maurine Minister Simon, PA-C   237 mL at 10/25/14 1000  . folic acid (FOLVITE) tablet 1 mg  1 mg Oral Daily Laverle Hobby, PA-C   1 mg at 10/25/14 0817  . gabapentin (NEURONTIN) capsule 300 mg  300 mg Oral QHS Laverle Hobby, PA-C   300 mg at 10/24/14 2209  . hydrOXYzine (ATARAX/VISTARIL) tablet 25 mg  25 mg Oral Q6H PRN Laverle Hobby, PA-C      . lithium carbonate capsule 300 mg  300 mg Oral BID WC Laverle Hobby, PA-C   300 mg at 10/25/14 0817  . LORazepam (ATIVAN)  tablet 1 mg  1 mg Oral  Q6H PRN Laverle Hobby, PA-C   1 mg at 10/25/14 0630  . magnesium hydroxide (MILK OF MAGNESIA) suspension 30 mL  30 mL Oral Daily PRN Laverle Hobby, PA-C      . multivitamin with minerals tablet 1 tablet  1 tablet Oral Daily Laverle Hobby, PA-C   1 tablet at 10/25/14 0818  . naproxen (NAPROSYN) tablet 500 mg  500 mg Oral BID PRN Laverle Hobby, PA-C   500 mg at 10/25/14 1157  . nicotine (NICODERM CQ - dosed in mg/24 hours) patch 21 mg  21 mg Transdermal Daily Laverle Hobby, PA-C   21 mg at 10/25/14 2620  . ondansetron (ZOFRAN-ODT) disintegrating tablet 4 mg  4 mg Oral Q6H PRN Laverle Hobby, PA-C      . QUEtiapine (SEROQUEL) tablet 300 mg  300 mg Oral QHS Laverle Hobby, PA-C   300 mg at 10/24/14 2209  . sertraline (ZOLOFT) tablet 50 mg  50 mg Oral Daily Laverle Hobby, PA-C   50 mg at 10/25/14 0818  . thiamine (VITAMIN B-1) tablet 100 mg  100 mg Oral Daily Laverle Hobby, PA-C   100 mg at 10/25/14 3559  . traMADol (ULTRAM) tablet 50 mg  50 mg Oral Q6H PRN Nicholaus Bloom, MD      . zolpidem [facility]) tablet 10 mg  10 mg Oral QHS PRN Nicholaus Bloom, MD       PTA Medications: Prescriptions prior to admission  Medication Sig Dispense Refill Last Dose  . carbamazepine (TEGRETOL) 200 MG tablet Take 1 tablet (200 mg total) by mouth at bedtime. 30 tablet 1 10/18/2014 at Unknown time  . docusate sodium (COLACE) 100 MG capsule Take 1 capsule (100 mg total) by mouth 2 (two) times daily. 10 capsule 0   . feeding supplement, ENSURE COMPLETE, (ENSURE COMPLETE) LIQD Take 237 mLs by mouth 2 (two) times daily between meals.     . folic acid (FOLVITE) 1 MG tablet Take 1 tablet (1 mg total) by mouth daily.     [name] gabapentin (NEURONTIN) 300 MG capsule Take 300 mg by mouth at bedtime.     [name] lithium carbonate 300 MG capsule Take 1 capsule (300 mg total) by mouth 2 (two) times daily with a meal.     . LORazepam (ATIVAN) 1 MG tablet Take 1 tablet (1 mg total) by mouth every 4 (four) hours  as needed for anxiety (CIWA-AR > 8-OR-withdrawal symptoms:anxiety, agitation, insomnia, diaphoresis, nausea, vomiting, tremors, tachycardia, or hypertension.). Use every 4 hours as needed for next 2 days, then every 8 hours as needed.  0   . Multiple Vitamin (MULTIVITAMIN WITH MINERALS) TABS tablet Take 1 tablet by mouth daily.     . naproxen sodium (ANAPROX) 220 MG tablet Take 220 mg by mouth daily as needed (pain).   unknown  . nicotine (NICODERM CQ - DOSED IN MG/24 HOURS) 21 mg/24hr patch Place 1 patch (21 mg total) onto the skin daily. 28 patch 0   . oxyCODONE (OXY IR/ROXICODONE) 5 MG immediate release tablet Take 1 tablet (5 mg total) by mouth every 6 (six) hours as needed for severe pain. 15 tablet 0   . QUEtiapine (SEROQUEL) 100 MG tablet Take 1 tablet (100 mg total) by mouth 2 (two) times daily. (Patient taking differently: Take 100-300 mg by mouth 2 (two) times daily. Take 100 mg every morning Take 300 mg at bedtime) 60 tablet 1 10/15/2014  . sertraline (ZOLOFT) 50 MG tablet  Take 1 tablet (50 mg total) by mouth daily. 30 tablet 1 10/18/2014 at Unknown time  . thiamine 100 MG tablet Take 1 tablet (100 mg total) by mouth daily.       Previous Psychotropic Medications: Yes   Substance Abuse History in the last 12 months:  Yes.      Consequences of Substance Abuse: Blackouts:   DT's:  Results for orders placed or performed during the hospital encounter of 10/24/14 (from the past 72 hour(s))  Lithium level     Status: Abnormal   Collection Time: 10/25/14  6:38 AM  Result Value Ref Range   Lithium Lvl 0.21 (L) 0.80 - 1.40 mmol/L    Comment: Performed at [facility]  Carbamazepine level, total     Status: Abnormal   Collection Time: 10/25/14  6:38 AM  Result Value Ref Range   Carbamazepine Lvl 3.3 (L) 4.0 - 12.0 ug/mL    Comment: Performed at [facility]  TSH     Status: None   Collection Time: 10/25/14  6:38 AM  Result Value Ref Range   TSH 1.802  0.350 - 4.500 uIU/mL    Comment: Performed at [facility]    Observation Level/Precautions:  15 minute checks  Laboratory:  Per ED  Psychotherapy:  Group  Medications:  As per medlist  Consultations:  As needed  Discharge Concerns:  safety   Estimated LOS:  5-7 days  Other:     Psychological Evaluations: Yes   Treatment Plan Summary: Daily contact with patient to assess and evaluate symptoms and progress in treatment and Medication management  Gabapentin 300 mg BID mood symptoms, pain sx Ativan CIWA protocol Lithium 300 mg BID mood sx Sertraline 50 mg QD depression Seroquel 300 mg QD  Medical Decision Making:  Review of Psycho-Social Stressors (1), Discuss test with performing physician (1), Review and summation of old records (2), Review of Last Therapy Session (1), Review or order medicine tests (1), Review of Medication Regimen & Side Effects (2) and Review of New Medication or Change in Dosage (2)  I certify that inpatient services furnished can reasonably be expected to improve the patient's condition.   Kerrie Buffalo MAY, AGNP-BC 3/9/20162:39 PM I personally assessed the patient, reviewed the physical exam and labs and formulated the treatment plan Geralyn Flash A. Sabra Heck, M.D.

## 2014-10-25 NOTE — BHH Group Notes (Signed)
[facility] LCSW Aftercare Discharge Planning Group Note   10/25/2014 11:03 AM  Participation Quality:  Appropriate   Mood/Affect:  Irritable  Depression Rating:  0-1   Anxiety Rating:  10  Thoughts of Suicide:  No Will you contract for safety?   NA  Current AVH:  No  Plan for Discharge/Comments:  Pt reports that he voluntarily admitted for alcohol detox after experiencing fall down flight of stairs. Pt reports severe pain and is upset that his pain is not being managed at [facility]. MD notified. Pt reports that he has been taking mental health meds as prescribed and has hx at [facility] for mental health services. Pt plans to return home at d/c and reports mild w/d currently. Fair sleep.  Transportation Means: SCAT or bus   Supports: none identified by pt.    Smart, [employer]

## 2014-10-25 NOTE — Progress Notes (Signed)
Recreation Therapy Notes  Date: 03.09.2016 Time: 9:30am Location: 300 Hall Group Room   Group Topic: Stress Management  Goal Area(s) Addresses:  Patient will actively participate in stress management techniques presented during session.   Behavioral Response: Did not attend.   Laureen Ochs Jaeleah Smyser, LRT/CTRS  Nayara Taplin L 10/25/2014 1:48 PM
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
## 2014-10-25 NOTE — BHH Group Notes (Signed)
[place] LCSW Group Therapy  10/25/2014 3:19 PM  Type of Therapy:  Group Therapy  Participation Level:  Did Not Attend -pt taken out of group to meet with NP. He did not return.   Summary of Progress/Problems: Today's Topic: Overcoming Obstacles. Pts were asked to identify obstacles faced currently and processed barriers involved in overcoming these obstacles. Pts identified steps necessary for overcoming these obstacles and explored motivation (internal and external) for facing these difficulties head on. Pts further identified one area of concern in their lives and chose a skill of focus pulled from their "toolbox."   Smart, [place]  10/25/2014, 3:19 PM
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
## 2014-10-25 NOTE — Progress Notes (Signed)
Patient ID: [name], male   DOB: 05/21/1971, 44 y.o.   MRN: [id number] PER STATE REGULATIONS 482.30  THIS CHART WAS REVIEWED FOR MEDICAL NECESSITY WITH RESPECT TO THE PATIENT'S ADMISSION/DURATION OF STAY.  NEXT REVIEW DATE: 10/28/14  Roma Schanz, RN, BSN CASE MANAGER

## 2014-10-25 NOTE — BHH Suicide Risk Assessment (Signed)
[facility] Admission Suicide Risk Assessment   Nursing information obtained from:  Patient Demographic factors:  Male, Divorced or widowed, Caucasian, Living alone, Unemployed Current Mental Status:  NA Loss Factors:  Loss of significant relationship, Decline in physical health, Financial problems / change in socioeconomic status Historical Factors:  Prior suicide attempts, Family history of mental illness or substance abuse Risk Reduction Factors:  Positive social support Total Time spent with patient: 45 minutes Principal Problem: Schizophrenia, schizoaffective, chronic with acute exacerbation Diagnosis:   Patient Active Problem List   Diagnosis Date Noted  . Schizoaffective disorder [F25.9] 12/29/2013    Priority: High  . Alcohol dependence [F10.20] 11/03/2011    Priority: High  . Schizophrenia, schizoaffective, chronic with acute exacerbation [F25.8] 10/24/2014  . Alcohol intoxication [F10.129]   . Chronic posttraumatic stress disorder [F43.12]   . Protein-calorie malnutrition, severe [E43] 10/19/2014  . Hyponatremia [E87.1] 10/18/2014  . Elevated LFTs [R79.89]   . Alcohol abuse with physiological dependence [F10.20]   . Hallucinations, visual [R44.1]   . Noncompliance with medication regimen [Z91.14]   . Hallucinations [R44.3] 07/07/2014  . Paranoia [F22] 07/07/2014  . Abnormal liver enzymes [R74.8] 02/14/2014  . Cannabis abuse [F12.10] 12/30/2013  . Post traumatic stress disorder (PTSD) [F43.10] 11/03/2011  . Active smoker [Z72.0] 06/30/2011     Continued Clinical Symptoms:  Alcohol Use Disorder Identification Test Final Score (AUDIT): 30 The "Alcohol Use Disorders Identification Test", Guidelines for Use in Primary Care, Second Edition.  World Pharmacologist [facility]). Score between 0-7:  no or low risk or alcohol related problems. Score between 8-15:  moderate risk of alcohol related problems. Score between 16-19:  high risk of alcohol related problems. Score 20 or above:   warrants further diagnostic evaluation for alcohol dependence and treatment.   CLINICAL FACTORS:   Alcohol/Substance Abuse/Dependencies   Musculoskeletal: Strength & Muscle Tone: decreased Gait & Station: using walker to ambulate Patient leans: N/A  Psychiatric Specialty Exam: Physical Exam  Review of Systems  Constitutional: Positive for malaise/fatigue.  HENT: Negative.   Eyes: Negative.   Respiratory: Negative.   Cardiovascular: Negative.   Gastrointestinal: Negative.   Musculoskeletal: Positive for back pain and joint pain.  Skin: Negative.   Neurological: Positive for weakness.  Endo/Heme/Allergies: Negative.   Psychiatric/Behavioral: Positive for depression, hallucinations and substance abuse. The patient is nervous/anxious and has insomnia.     Blood pressure 116/77, pulse 98, temperature 97.8 F (36.6 C), temperature source Oral, resp. rate 20, height 6' (1.829 m), weight 89.812 kg (198 lb).Body mass index is 26.85 kg/(m^2).  General Appearance: Fairly Groomed  Engineer, water::  Fair  Speech:  Clear and Coherent, Slow and not spontaneous  Volume:  Decreased  Mood:  Anxious and Depressed  Affect:  anxious worried  Thought Process:  Coherent and Goal Directed  Orientation:  Full (Time, Place, and Person)  Thought Content:  symptoms events worries and concerns  Suicidal Thoughts:  No  Homicidal Thoughts:  No  Memory:  Immediate;   Fair Recent;   Fair Remote;   Fair  Judgement:  Fair  Insight:  Present  Psychomotor Activity:  Restlessness  Concentration:  Fair  Recall:  [employer] of Knowledge:Fair  Language: Fair  Akathisia:  No  Handed:  Right  AIMS (if indicated):     Assets:  Desire for Improvement Housing  Sleep:  Number of Hours: 55  Cognition: WNL  ADL's:  Intact     COGNITIVE FEATURES THAT CONTRIBUTE TO RISK:  Closed-mindedness, Polarized thinking and  Thought constriction (tunnel vision)    SUICIDE RISK:   Moderate:  Frequent suicidal ideation  with limited intensity, and duration, some specificity in terms of plans, no associated intent, good self-control, limited dysphoria/symptomatology, some risk factors present, and identifiable protective factors, including available and accessible social support.  PLAN OF CARE: Supportive approach/coping skills/relapse prevention Alcohol Dependence; will pursue detox further as necessary. Will work a relapse prevention plan. Will explore benefit of using medication for cravings if cravings were an issue Mood Disorder with psychosis: currently  on Tegretol, Lithium, Zoloft, Seroquel, Neurontin. Will reassess effectiveness optimize response and work to simplify the medication regime if possible He admits that he has been staying by himself. He is  on disability feels he has no purpose in life. He is still affected by the loss of his GF to suicide in front of him. His mother will be moving in with him. States she needs his help and this will give him the sense of purpose he was looking for.  Medical Decision Making:  Review of Psycho-Social Stressors (1), Review or order clinical lab tests (1), Review of Medication Regimen & Side Effects (2) and Review of New Medication or Change in Dosage (2)  I certify that inpatient services furnished can reasonably be expected to improve the patient's condition.   Kalee Mcclenathan A 10/25/2014, 4:48 PM

## 2014-10-25 NOTE — Progress Notes (Signed)
Pt attended NA group this evening.  

## 2014-10-26 DIAGNOSIS — F1020 Alcohol dependence, uncomplicated: Secondary | ICD-10-CM

## 2014-10-26 DIAGNOSIS — G4700 Insomnia, unspecified: Secondary | ICD-10-CM

## 2014-10-26 LAB — GABAPENTIN LEVEL: Gabapentin Lvl: 3.4 ug/mL

## 2014-10-26 LAB — PROLACTIN: Prolactin: 13.2 ng/mL (ref 4.0–15.2)

## 2014-10-26 NOTE — Progress Notes (Signed)
D:  Patient's self inventory sheet, patient sleeps good, sleep medication is helpful.  Good appetite, normal energy level, poor concentration.  Rated depression 7, hopeless 3, anxiety 8.  Withdrawals continue tremors, runny nose.  Denied SI.  Physical problems of pain, dizziness, blurred vision.  Physical pain, worst pain #8, lower back, tailbone.  Pain medication is helpful.  Goal is to improve concentration.  Plans to pay attention in groups.  Wants to work with physical therapist.  Does have discharge plans.  No problems anticipated after discharge. A:  Medications administered per MD orders.  Emotional support and encouragement given patient. R:  Denied SI and HI.  Does hear voices talking degrading things to him, sees shadows. Stated he wants a donut hole for his lower back, continue PT.

## 2014-10-26 NOTE — Progress Notes (Signed)
Pt alert and cooperative. Affect/ mood anxious and depressed. -SI/HI, verbally contracts for safety. +A/Vhall (reports shadows and laughter sounds).Visible in milieu, attended group and interactive with peers. C/o generalized pain and weakness, see MAR. Gait has improved, walker and stand by assist continues due to unsteadiness and weakness.  Emotional support and encouragement given. Will monitor closely and evaluate for stabilization.

## 2014-10-26 NOTE — Progress Notes (Signed)
NUTRITION ASSESSMENT  Pt identified as at risk on the Malnutrition Screen Tool  INTERVENTION: 1. Educated patient on the importance of nutrition and encouraged intake of food and beverages. 2. Discussed weight goals. 3. Supplements: Ensure Complete po BID, each supplement provides 350 kcal and 13 grams of protein  NUTRITION DIAGNOSIS: Increased nutrient need related to ETOH abuse as evidenced by estimated nutritional needs.  Goal: Pt to meet >/= 90% of their estimated nutrition needs.  Monitor:  PO intake  Assessment:  Pt admitted with ETOH abuse and schizophrenia.   Pt reports good appetite since admission. PTA pt states he didn't eat for a month, states he was drinking mostly without eating.  Pt has had weight gain recently since September 2015.   Pt has been ordered Ensure supplements and is drinking them with no problems.    Height: Ht Readings from Last 1 Encounters:  10/24/14 6' (1.829 m)    Weight: Wt Readings from Last 1 Encounters:  10/24/14 198 lb (89.812 kg)    Weight Hx: Wt Readings from Last 10 Encounters:  10/24/14 198 lb (89.812 kg)  05/04/14 185 lb (83.915 kg)  02/09/14 182 lb (82.555 kg)  12/29/13 200 lb (90.719 kg)  12/13/13 210 lb (95.255 kg)  10/30/11 220 lb (99.791 kg)  07/03/11 209 lb 7 oz (95 kg)    BMI:  Body mass index is 26.85 kg/(m^2). Pt meets criteria for overweight based on current BMI.  Estimated Nutritional Needs: Kcal: 30-35 kcal/kg Protein: > 1 gram protein/kg Fluid: 1 ml/kcal  Diet Order: Diet regular Pt is also offered choice of unit snacks mid-morning and mid-afternoon.  Pt is eating as desired.   Lab results and medications reviewed.   Clayton Bibles, MS, RD, LDN Pager: [phone] After Hours Pager: [phone]

## 2014-10-26 NOTE — Plan of Care (Signed)
Problem: Consults Goal: Anxiety Disorder Patient Education See Patient Education Module for eduction specifics.  Outcome: Completed/Met Date Met:  10/26/14 Nurse talked to patient about anxiety.

## 2014-10-26 NOTE — BHH Suicide Risk Assessment (Signed)
[place] INPATIENT:  Family/Significant Other Suicide Prevention Education  Suicide Prevention Education:  Education Completed; Cher Nakai (pt's mother) [phone] has been identified by the patient as the family member/significant other with whom the patient will be residing, and identified as the person(s) who will aid the patient in the event of a mental health crisis (suicidal ideations/suicide attempt).  With written consent from the patient, the family member/significant other has been provided the following suicide prevention education, prior to the and/or following the discharge of the patient.  The suicide prevention education provided includes the following:  Suicide risk factors  Suicide prevention and interventions  National Suicide Hotline telephone number  [facility] assessment telephone number  [facility] Emergency Assistance [place] and/or Residential Mobile Crisis Unit telephone number  Request made of family/significant other to:  Remove weapons (e.g., guns, rifles, knives), all items previously/currently identified as safety concern.    Remove drugs/medications (over-the-counter, prescriptions, illicit drugs), all items previously/currently identified as a safety concern.  The family member/significant other verbalizes understanding of the suicide prevention education information provided.  The family member/significant other agrees to remove the items of safety concern listed above.  Smart, Adellyn Capek LCSWA 10/26/2014, 3:05 PM

## 2014-10-26 NOTE — Progress Notes (Signed)
[facility] MD Progress Note  10/26/2014 3:55 PM [name]  MRN:  [id number] Subjective:  [name] endorses pain in the coccyx area. He still feels "shaky" He is still recovering from the last event. He states he still thinks that once his mother moves in with him he is going to have the structure, the sense of purpose he needs to get himself healthy. He is not sure about his medications what are they doing for him. Principal Problem: Schizophrenia, schizoaffective, chronic with acute exacerbation Diagnosis:   Patient Active Problem List   Diagnosis Date Noted  . Schizoaffective disorder [F25.9] 12/29/2013    Priority: High  . Alcohol dependence [F10.20] 11/03/2011    Priority: High  . Schizophrenia, schizoaffective, chronic with acute exacerbation [F25.8] 10/24/2014  . Alcohol intoxication [F10.129]   . Chronic posttraumatic stress disorder [F43.12]   . Protein-calorie malnutrition, severe [E43] 10/19/2014  . Hyponatremia [E87.1] 10/18/2014  . Elevated LFTs [R79.89]   . Alcohol abuse with physiological dependence [F10.20]   . Hallucinations, visual [R44.1]   . Noncompliance with medication regimen [Z91.14]   . Hallucinations [R44.3] 07/07/2014  . Paranoia [F22] 07/07/2014  . Abnormal liver enzymes [R74.8] 02/14/2014  . Cannabis abuse [F12.10] 12/30/2013  . Post traumatic stress disorder (PTSD) [F43.10] 11/03/2011  . Active smoker [Z72.0] 06/30/2011   Total Time spent with patient: 30 minutes   Past Medical History:  Past Medical History  Diagnosis Date  . Depression   . Psychosis   . PTSD (post-traumatic stress disorder)   . Seizure disorder     related to etoh seizure  . Alcohol abuse   . Schizoaffective disorder   . Anxiety     Past Surgical History  Procedure Laterality Date  . Foot surgery      right  . Hand surgery     Family History:  Family History  Problem Relation Age of Onset  . Alcoholism Mother   . CAD Father   . Depression Brother    Social History:   History  Alcohol Use  . 50.4 oz/week  . 84 Cans of beer per week    Comment: 6-8 cans beer per day, 24 ounce cans for past 1 month     History  Drug Use  . 1.00 per week  . Special: Marijuana    Comment: THC 2 to 3 times per month    History   Social History  . Marital Status: Single    Spouse Name: N/A  . Number of Children: N/A  . Years of Education: N/A   Social History Main Topics  . Smoking status: Current Every Day Smoker -- 1.50 packs/day for 24 years    Types: Cigarettes  . Smokeless tobacco: Never Used  . Alcohol Use: 50.4 oz/week    84 Cans of beer per week     Comment: 6-8 cans beer per day, 24 ounce cans for past 1 month  . Drug Use: 1.00 per week    Special: Marijuana     Comment: THC 2 to 3 times per month  . Sexual Activity: Yes   Other Topics Concern  . None   Social History Narrative   Additional History:    Sleep: Fair  Appetite:  starting to get better   Assessment:   Musculoskeletal: Strength & Muscle Tone: decreased Gait & Station: uses walker to help himself around Patient leans: N/A   Psychiatric Specialty Exam: Physical Exam  Review of Systems  Constitutional: Positive for malaise/fatigue.  HENT: Negative.  Eyes: Negative.   Respiratory: Negative.   Cardiovascular: Negative.   Gastrointestinal: Negative.   Genitourinary: Negative.   Musculoskeletal: Negative.   Skin: Negative.   Neurological: Positive for weakness.  Endo/Heme/Allergies: Negative.   Psychiatric/Behavioral: Positive for depression and substance abuse. The patient is nervous/anxious and has insomnia.     Blood pressure 111/82, pulse 85, temperature 98.2 F (36.8 C), temperature source Oral, resp. rate 14, height 6' (1.829 m), weight 89.812 kg (198 lb), SpO2 97 %.Body mass index is 26.85 kg/(m^2).  General Appearance: Fairly Groomed  Engineer, water::  Fair  Speech:  Clear and Coherent  Volume:  Decreased  Mood:  Anxious and Depressed  Affect:  Restricted   Thought Process:  Coherent and Goal Directed  Orientation:  Full (Time, Place, and Person)  Thought Content:  symptoms events worries concerns  Suicidal Thoughts:  No  Homicidal Thoughts:  No  Memory:  Immediate;   Fair Recent;   Fair Remote;   Fair  Judgement:  Fair  Insight:  Present and Shallow  Psychomotor Activity:  Restlessness  Concentration:  Fair  Recall:  [employer] of Knowledge:Fair  Language: Fair  Akathisia:  No  Handed:  Right  AIMS (if indicated):     Assets:  Desire for Improvement Housing Social Support  ADL's:  Intact  Cognition: WNL  Sleep:  Number of Hours: 6.75     Current Medications: Current Facility-Administered Medications  Medication Dose Route Frequency Provider Last Rate Last Dose  . acetaminophen (TYLENOL) tablet 650 mg  650 mg Oral Q6H PRN Laverle Hobby, PA-C      . alum & mag hydroxide-simeth (MAALOX/MYLANTA) 200-200-20 MG/5ML suspension 30 mL  30 mL Oral Q4H PRN Laverle Hobby, PA-C      . docusate sodium (COLACE) capsule 100 mg  100 mg Oral BID Laverle Hobby, PA-C   100 mg at 10/26/14 6578  . feeding supplement (ENSURE COMPLETE) (ENSURE COMPLETE) liquid 237 mL  237 mL Oral BID BM Clayton Bibles, RD   237 mL at 10/26/14 1443  . folic acid (FOLVITE) tablet 1 mg  1 mg Oral Daily Laverle Hobby, PA-C   1 mg at 10/26/14 4696  . gabapentin (NEURONTIN) capsule 300 mg  300 mg Oral QHS Laverle Hobby, PA-C   300 mg at 10/25/14 2110  . hydrOXYzine (ATARAX/VISTARIL) tablet 25 mg  25 mg Oral Q6H PRN Laverle Hobby, PA-C      . lithium carbonate capsule 300 mg  300 mg Oral BID WC Laverle Hobby, PA-C   300 mg at 10/26/14 2952  . LORazepam (ATIVAN) tablet 1 mg  1 mg Oral Q6H PRN Laverle Hobby, PA-C   1 mg at 10/26/14 1446  . magnesium hydroxide (MILK OF MAGNESIA) suspension 30 mL  30 mL Oral Daily PRN Laverle Hobby, PA-C      . multivitamin with minerals tablet 1 tablet  1 tablet Oral Daily Laverle Hobby, PA-C   1 tablet at 10/26/14 8413  .  naproxen (NAPROSYN) tablet 500 mg  500 mg Oral BID PRN Laverle Hobby, PA-C   500 mg at 10/25/14 2440  . nicotine (NICODERM CQ - dosed in mg/24 hours) patch 21 mg  21 mg Transdermal Daily Laverle Hobby, PA-C   21 mg at 10/26/14 1027  . ondansetron (ZOFRAN-ODT) disintegrating tablet 4 mg  4 mg Oral Q6H PRN Laverle Hobby, PA-C      . QUEtiapine (SEROQUEL) tablet 300 mg  300 mg Oral QHS Laverle Hobby, PA-C   300 mg at 10/25/14 2111  . sertraline (ZOLOFT) tablet 50 mg  50 mg Oral Daily Laverle Hobby, PA-C   50 mg at 10/26/14 0827  . thiamine (VITAMIN B-1) tablet 100 mg  100 mg Oral Daily Laverle Hobby, PA-C   100 mg at 10/26/14 2751  . traMADol (ULTRAM) tablet 50 mg  50 mg Oral Q6H PRN Nicholaus Bloom, MD   50 mg at 10/26/14 1446  . zolpidem (AMBIEN) tablet 10 mg  10 mg Oral QHS PRN Nicholaus Bloom, MD   10 mg at 10/25/14 2157    Lab Results:  Results for orders placed or performed during the hospital encounter of 10/24/14 (from the past 48 hour(s))  Lithium level     Status: Abnormal   Collection Time: 10/25/14  6:38 AM  Result Value Ref Range   Lithium Lvl 0.21 (L) 0.80 - 1.40 mmol/L    Comment: Performed at [facility]  Carbamazepine level, total     Status: Abnormal   Collection Time: 10/25/14  6:38 AM  Result Value Ref Range   Carbamazepine Lvl 3.3 (L) 4.0 - 12.0 ug/mL    Comment: Performed at [facility]  TSH     Status: None   Collection Time: 10/25/14  6:38 AM  Result Value Ref Range   TSH 1.802 0.350 - 4.500 uIU/mL    Comment: Performed at [facility]  Prolactin     Status: None   Collection Time: 10/25/14  6:38 AM  Result Value Ref Range   Prolactin 13.2 4.0 - 15.2 ng/mL    Comment: (NOTE) Performed At: [facility] [place], [place] [id number] Lindon Romp MD HQ:[id number] Performed at [facility]     Physical Findings: AIMS: Facial and Oral Movements Muscles of Facial  Expression: None, normal Lips and Perioral Area: None, normal Jaw: None, normal Tongue: None, normal,Extremity Movements Upper (arms, wrists, hands, fingers): None, normal Lower (legs, knees, ankles, toes): None, normal, Trunk Movements Neck, shoulders, hips: None, normal, Overall Severity Severity of abnormal movements (highest score from questions above): None, normal Incapacitation due to abnormal movements: None, normal Patient's awareness of abnormal movements (rate only patient's report): No Awareness, Dental Status Current problems with teeth and/or dentures?: No Does patient usually wear dentures?: No  CIWA:  CIWA-Ar Total: 5 COWS:  COWS Total Score: 5  Treatment Plan Summary: Daily contact with patient to assess and evaluate symptoms and progress in treatment and Medication management Supportive approach/coping skills/relapse prevention Mood Disorder: states that he feels the Zoloft is very helpful for his depression and anxiety. The Seroquel he also finds helpful for his sleep and helps with the anxiety. The Tegretol he does not know what is doing for him. His dose was decreased to 200 mg daily and his level is under therapeutic. Given that his liver enzymes are so high we need to be careful with medications cleared trough the liver. He was already started on the Lithium, what is cleared trough the kidneys. After discussing it with him we decided to go off the Tegretol and optimize response to the Lithium. He was also put on Neurontin for the pain and he has found it effective. If he was to need further help with with mood stabilization, his Neurontin dosage could be increased. Neurontin is also cleared trough the kidney so we could decrease the burden on the liver.  Insomnia: He did sleep better with the Ambien, will continue Hallucinations; states that the voices have been there all along, they have never gone away, he has  just gotten more used to them. Will continue the Seroquel 300  mg HS Alcohol Dependence: .will continue to work a relapse prevention plan and introduce the possibility of using anti craving medications Medical Decision Making:  Review of Psycho-Social Stressors (1), Review or order clinical lab tests (1), Review of Medication Regimen & Side Effects (2) and Review of New Medication or Change in Dosage (2)     Draven Laine A 10/26/2014, 3:55 PM

## 2014-10-26 NOTE — Progress Notes (Signed)
The focus of this group is to educate the patient on the purpose and policies of crisis stabilization and provide a format to answer questions about their admission.  The group details unit policies and expectations of patients while admitted.  Patient attended 0900 nurse education orientation group this morning.  Patient actively participated, appropriate affect, alert, appropriate insight and engagement.  Today patient will work on 3 goals for discharge.  Patient also stated he wants PT that he was taking while in hospital.

## 2014-10-26 NOTE — Evaluation (Signed)
Physical Therapy Evaluation Patient Details Name: [name] MRN: [id number] DOB: 1971/03/14 Today's Date: 10/26/2014   History of Present Illness  44 yo  with hyponatremia/beer potomania; PMHx: schizoaffective disorder, hx ETOH abuse and depression, PTSD, back pain due to falling down teh steps.   Clinical Impression  Assessed pt and pt to benefit from PT to increase strength, balance and mobility in order to return home safely and eventually be independent with no Assistive devices. Pt has generalized weakness and tremors with movement and increases with distance. So at this time still recommend RW , until pt feels stronger with distances safe in all hallways. Started pt on Home exercise program that he can perform safely in hallway at railing to increase balance muscles and LEs. Handout was given to pt and nurse.     Follow Up Recommendations Home health PT    Equipment Recommendations  Rolling walker with 5" wheels;Cane (RW or cane will depend on pt's progress while he is here. )    Recommendations for Other Services       Precautions / Restrictions Precautions Precautions: Fall Restrictions Weight Bearing Restrictions: No      Mobility  Bed Mobility Overal bed mobility: Independent                Transfers Overall transfer level: Independent Equipment used: None Transfers: Sit to/from Stand Sit to Stand: Independent Stand pivot transfers: Independent          Ambulation/Gait Ambulation/Gait assistance: Min guard;Min assist (can walk MOD I with RW, so tried walkign with no AD,and with increased distance, pt fatgued and needed to use rail and/or hand held assit at end of walk. ) Ambulation Distance (Feet): 200 Feet Assistive device: 1 person hand held assist Gait Pattern/deviations: Step-through pattern;Wide base of support Gait velocity: moderate      Stairs            Wheelchair Mobility    Modified Rankin (Stroke Patients Only)        Balance Overall balance assessment: Needs assistance         Standing balance support: Single extremity supported Standing balance-Leahy Scale: Fair                               Pertinent Vitals/Pain Pain Score: 3  Pain Location: in his coccxy area when sitting and leaning forward , as well as in different places on his back. You can see several places with bruises where he feel down the steps. He also has a brusie on his R sheen area as well.  Pain Descriptors / Indicators: Sore;Aching Pain Intervention(s): Monitored during session (eduated pt to try ice or heat in coccyx are and on LB if makes it feel better. Both are good options and nurses can supply with these items. )    Home Living Family/patient expects to be discharged to:: Private residence Living Arrangements: Alone Available Help at Discharge: Family;Available PRN/intermittently (mother may be planning to be with him upon discharge per nurse) Type of Home: Apartment Home Access: Stairs to enter Entrance Stairs-Rails: Left Entrance Stairs-Number of Steps: 13 (pt stated his sister ws fixing the steps to make them more sterdy) Home Layout: One level Home Equipment: None      Prior Function Level of Independence: Independent               Hand Dominance        Extremity/Trunk Assessment  Lower Extremity Assessment: Overall WFL for tasks assessed (just some tremors with movment in his UES and LES and especially with fatigue)         Communication   Communication: No difficulties  Cognition Arousal/Alertness: Awake/alert Behavior During Therapy: WFL for tasks assessed/performed (very pleasnat, and trying hard to get stronger and improve) Overall Cognitive Status: Within Functional Limits for tasks assessed                      General Comments      Exercises Other Exercises Other Exercises: worked at [place] in hallway for : side stepping R and L , toe and  heel raises 10x each, marching 10x each, forward and backward walking at rail. All with 1 UE support and using about 10 foot railing.  Other Exercises: Also assessed pt for vestibular issues. AS pt reported he got dizzy when he put his head downa dn them back up. So assess nystagmus in supine, rolling and side to sit postion, eye pursuits, and turning head, all negative for nystagmus or provoking dizziness      Assessment/Plan    PT Assessment Patient needs continued PT services  PT Diagnosis Difficulty walking;Generalized weakness   PT Problem List Decreased strength;Decreased activity tolerance;Decreased balance;Decreased mobility  PT Treatment Interventions Gait training;Functional mobility training;Therapeutic activities;Therapeutic exercise   PT Goals (Current goals can be found in the Care Plan section) Acute Rehab PT Goals Patient Stated Goal: I am making progress and what to continue to get better PT Goal Formulation: With patient Time For Goal Achievement: 11/09/14 Potential to Achieve Goals: Good    Frequency Min 3X/week   Barriers to discharge        Co-evaluation               End of Session     Patient left:  (group meeting ) Nurse Communication: Mobility status         Time: 1310-1356 PT Time Calculation (min) (ACUTE ONLY): 46 min   Charges:   PT Evaluation $Initial PT Evaluation Tier I: 1 Procedure PT Treatments $Gait Training: 8-22 mins $Therapeutic Exercise: 8-22 mins   PT G CodesClide Dales November 25, 2014, 3:52 PM  Clide Dales, PT Pager: [phone] 25-Nov-2014

## 2014-10-27 DIAGNOSIS — F10239 Alcohol dependence with withdrawal, unspecified: Secondary | ICD-10-CM

## 2014-10-27 LAB — CARBAMAZEPINE, FREE AND TOTAL
Carbamazepine Metabolite -: 1.1 ug/mL (ref 0.2–2.0)
Carbamazepine Metabolite/: 0.6 ug/mL
Carbamazepine Metabolite: 0.5 ug/mL (ref 0.1–1.0)
Carbamazepine, Bound: 2.6 ug/mL
Carbamazepine, Free: 0.6 ug/mL — ABNORMAL LOW (ref 1.0–3.0)
Carbamazepine, Total: 3.2 ug/mL — ABNORMAL LOW (ref 4.0–12.0)

## 2014-10-27 MED ORDER — LORAZEPAM 1 MG PO TABS
1.0000 mg | ORAL_TABLET | Freq: Four times a day (QID) | ORAL | Status: DC | PRN
  Administered 2014-10-27 – 2014-10-30 (×11): 1 mg via ORAL
  Filled 2014-10-27 (×11): qty 1

## 2014-10-27 MED ORDER — LORAZEPAM 1 MG PO TABS: 1.0000 mg | ORAL_TABLET | Freq: Four times a day (QID) | ORAL | Status: DC | PRN

## 2014-10-27 NOTE — Progress Notes (Signed)
D   Pt is depressed and anxious   He frequently asks for medications and keeps up with the time he can have his next medications   He did not attend group and has had minimal interaction with staff and peers A   Verbal support given   Medications administered and effectiveness monitored   Q 15 min checks    Encouraged group attendance R   Pt safe at present and verbalized understanding

## 2014-10-27 NOTE — Plan of Care (Signed)
Problem: Ineffective individual coping Goal: STG: Patient will remain free from self harm Outcome: Progressing Patient remains free from self harm. Pt denies having suicidal thoughts today. 15 minute checks continued per protocol for patient safety.   Problem: Alteration in mood Goal: LTG-Patient reports reduction in suicidal thoughts (Patient reports reduction in suicidal thoughts and is able to verbalize a safety plan for whenever patient is feeling suicidal)  Outcome: Progressing Patient denies having any suicidal thoughts today.  Problem: Diagnosis: Increased Risk For Suicide Attempt Goal: STG-Patient Will Attend All Groups On The Unit Outcome: Progressing Patient is attending unit groups today. Goal: STG-Patient Will Comply With Medication Regime Outcome: Progressing Patient has adhered to medication regimen today.

## 2014-10-27 NOTE — Progress Notes (Signed)
Patient did attend the evening karaoke group.  

## 2014-10-27 NOTE — BHH Group Notes (Signed)
[place] LCSW Group Therapy 10/27/2014 1:15 PM Type of Therapy: Group Therapy Participation Level: Active  Participation Quality: Attentive, Sharing and Supportive  Affect: Depressed and Flat  Cognitive: Alert and Oriented  Insight: Developing/Improving and Engaged  Engagement in Therapy: Developing/Improving and Engaged  Modes of Intervention: Clarification, Confrontation, Discussion, Education, Exploration, Limit-setting, Orientation, Problem-solving, Rapport Building, Art therapist, Socialization and Support  Summary of Progress/Problems: The topic for today was feelings about relapse. Pt discussed what relapse prevention is to them and identified triggers that they are on the path to relapse. Pt processed their feeling towards relapse and was able to relate to peers. Pt discussed coping skills that can be used for relapse prevention. Patient discussed having 3.5 years of sobriety and shared about the events that caused his relapse. He also expressed frustration regarding his medications. CSW and other group members provided patient with emotional support and encouragement.   Tilden Fossa, MSW, [place] Worker [facility] [phone]

## 2014-10-27 NOTE — Progress Notes (Signed)
D: Patient is alert and oriented. Pt's mood and affect is anxious. Pt ambulates with walker. Pt denies SI and HI. Pt reports AH of "voices, they make me feel bad about myself." Pt denies having any command hallucinations. Pt reports VH of "shadows." Pt rates his depression and hopelessness 4/10 and anxiety 8/10. Pt complains of 8/10 back pain this morning, cold pack offered, pt refused. Pt reports his goal for the day is "talking to the doctor about the walker." Pt is observed in the hall completing his PT exercises that he says he was taught. Pt complains of anxiety this afternoon with no relief from Vistaril medication. A: MD made aware of pt's anxiety and new orders acknowledged. PRN medication administered for pain and anxiety per providers orders (See MAR). Encouragement/Support provided to pt. Active listening by RN. Scheduled medications administered per providers orders (See MAR). 15 minute checks continued per protocol for patient safety.  R: Patient cooperative and receptive to nursing interventions. Pt remains safe.

## 2014-10-27 NOTE — BHH Group Notes (Signed)
   [facility] LCSW Aftercare Discharge Planning Group Note  10/27/2014  8:45 AM   Participation Quality: Alert, Appropriate and Oriented  Mood/Affect: Appropriate  Depression Rating: 4  Anxiety Rating: 8  Thoughts of Suicide: Pt denies SI/HI  Will you contract for safety? Yes  Current AVH: Pt denies  Plan for Discharge/Comments: Pt attended discharge planning group and actively participated in group. CSW provided pt with today's workbook. Patient plans to return home to follow up with [facility] at discharge.   Transportation Means: Pt reports access to transportation  Supports: No supports mentioned at this time  Tilden Fossa, MSW, [place] Social Worker [employer] [phone]

## 2014-10-27 NOTE — Progress Notes (Signed)
[facility] MD Progress Note  10/27/2014 4:44 PM [name]  MRN:  [id number] Subjective:  [name] would like to have the Ativan extended. He is still dealing with a lot of anxiety and possible residual withdrawal. He is still having some issues with his back [pain as well as ambulation. He states that the voices have always been there in the background and that he has never been able to get rid of them. At times they get worst and he has drank more to take care of them.  Principal Problem: Schizophrenia, schizoaffective, chronic with acute exacerbation Diagnosis:   Patient Active Problem List   Diagnosis Date Noted  . Schizoaffective disorder [F25.9] 12/29/2013    Priority: High  . Alcohol dependence [F10.20] 11/03/2011    Priority: High  . Schizophrenia, schizoaffective, chronic with acute exacerbation [F25.8] 10/24/2014  . Alcohol intoxication [F10.129]   . Chronic posttraumatic stress disorder [F43.12]   . Protein-calorie malnutrition, severe [E43] 10/19/2014  . Hyponatremia [E87.1] 10/18/2014  . Elevated LFTs [R79.89]   . Alcohol abuse with physiological dependence [F10.20]   . Hallucinations, visual [R44.1]   . Noncompliance with medication regimen [Z91.14]   . Hallucinations [R44.3] 07/07/2014  . Paranoia [F22] 07/07/2014  . Abnormal liver enzymes [R74.8] 02/14/2014  . Cannabis abuse [F12.10] 12/30/2013  . Post traumatic stress disorder (PTSD) [F43.10] 11/03/2011  . Active smoker [Z72.0] 06/30/2011   Total Time spent with patient: 30 minutes   Past Medical History:  Past Medical History  Diagnosis Date  . Depression   . Psychosis   . PTSD (post-traumatic stress disorder)   . Seizure disorder     related to etoh seizure  . Alcohol abuse   . Schizoaffective disorder   . Anxiety     Past Surgical History  Procedure Laterality Date  . Foot surgery      right  . Hand surgery     Family History:  Family History  Problem Relation Age of Onset  . Alcoholism Mother   .  CAD Father   . Depression Brother    Social History:  History  Alcohol Use  . 50.4 oz/week  . 84 Cans of beer per week    Comment: 6-8 cans beer per day, 24 ounce cans for past 1 month     History  Drug Use  . 1.00 per week  . Special: Marijuana    Comment: THC 2 to 3 times per month    History   Social History  . Marital Status: Single    Spouse Name: N/A  . Number of Children: N/A  . Years of Education: N/A   Social History Main Topics  . Smoking status: Current Every Day Smoker -- 1.50 packs/day for 24 years    Types: Cigarettes  . Smokeless tobacco: Never Used  . Alcohol Use: 50.4 oz/week    84 Cans of beer per week     Comment: 6-8 cans beer per day, 24 ounce cans for past 1 month  . Drug Use: 1.00 per week    Special: Marijuana     Comment: THC 2 to 3 times per month  . Sexual Activity: Yes   Other Topics Concern  . None   Social History Narrative   Additional History:    Sleep: Poor  Appetite:  Fair   Assessment:   Musculoskeletal: Strength & Muscle Tone: decreased Gait & Station: unsteady Patient leans: N/A   Psychiatric Specialty Exam: Physical Exam  Review of Systems  Constitutional: Negative.  HENT: Negative.   Eyes: Negative.   Respiratory: Negative.   Cardiovascular: Negative.   Gastrointestinal: Negative.   Genitourinary: Negative.   Musculoskeletal: Positive for back pain.  Skin: Negative.   Neurological: Positive for dizziness.  Endo/Heme/Allergies: Negative.   Psychiatric/Behavioral: Positive for substance abuse. The patient is nervous/anxious and has insomnia.     Blood pressure 101/61, pulse 110, temperature 99 F (37.2 C), temperature source Oral, resp. rate 20, height 6' (1.829 m), weight 89.812 kg (198 lb), SpO2 97 %.Body mass index is 26.85 kg/(m^2).  General Appearance: Fairly Groomed  Engineer, water::  Fair  Speech:  Clear and Coherent  Volume:  Decreased  Mood:  Anxious and worried  Affect:  anxious worried   Thought Process:  Coherent and Goal Directed  Orientation:  Full (Time, Place, and Person)  Thought Content:  symptoms events worries concerns  Suicidal Thoughts:  No  Homicidal Thoughts:  No  Memory:  Immediate;   Fair Recent;   Fair Remote;   Fair  Judgement:  Fair  Insight:  Present and Shallow  Psychomotor Activity:  Restlessness  Concentration:  Fair  Recall:  [employer] of Knowledge:Fair  Language: Fair  Akathisia:  No  Handed:  Right  AIMS (if indicated):     Assets:  Desire for Improvement Housing Social Support  ADL's:  Intact  Cognition: WNL  Sleep:  Number of Hours: 4     Current Medications: Current Facility-Administered Medications  Medication Dose Route Frequency Provider Last Rate Last Dose  . acetaminophen (TYLENOL) tablet 650 mg  650 mg Oral Q6H PRN Laverle Hobby, PA-C      . alum & mag hydroxide-simeth (MAALOX/MYLANTA) 200-200-20 MG/5ML suspension 30 mL  30 mL Oral Q4H PRN Laverle Hobby, PA-C      . docusate sodium (COLACE) capsule 100 mg  100 mg Oral BID Laverle Hobby, PA-C   100 mg at 10/26/14 1622  . feeding supplement (ENSURE COMPLETE) (ENSURE COMPLETE) liquid 237 mL  237 mL Oral BID BM Clayton Bibles, RD   237 mL at 10/27/14 0936  . folic acid (FOLVITE) tablet 1 mg  1 mg Oral Daily Laverle Hobby, PA-C   1 mg at 10/27/14 8341  . gabapentin (NEURONTIN) capsule 300 mg  300 mg Oral QHS Laverle Hobby, PA-C   300 mg at 10/26/14 2212  . hydrOXYzine (ATARAX/VISTARIL) tablet 25 mg  25 mg Oral Q6H PRN Laverle Hobby, PA-C   25 mg at 10/27/14 1304  . lithium carbonate capsule 300 mg  300 mg Oral BID WC Laverle Hobby, PA-C   300 mg at 10/27/14 1634  . LORazepam (ATIVAN) tablet 1 mg  1 mg Oral Q6H PRN Nicholaus Bloom, MD   1 mg at 10/27/14 1522  . magnesium hydroxide (MILK OF MAGNESIA) suspension 30 mL  30 mL Oral Daily PRN Laverle Hobby, PA-C      . multivitamin with minerals tablet 1 tablet  1 tablet Oral Daily Laverle Hobby, PA-C   1 tablet at  10/27/14 9622  . naproxen (NAPROSYN) tablet 500 mg  500 mg Oral BID PRN Laverle Hobby, PA-C   500 mg at 10/25/14 2979  . nicotine (NICODERM CQ - dosed in mg/24 hours) patch 21 mg  21 mg Transdermal Daily Laverle Hobby, PA-C   21 mg at 10/27/14 8921  . ondansetron (ZOFRAN-ODT) disintegrating tablet 4 mg  4 mg Oral Q6H PRN Laverle Hobby, PA-C      .  QUEtiapine (SEROQUEL) tablet 300 mg  300 mg Oral QHS Laverle Hobby, PA-C   300 mg at 10/26/14 2212  . sertraline (ZOLOFT) tablet 50 mg  50 mg Oral Daily Laverle Hobby, PA-C   50 mg at 10/27/14 3299  . thiamine (VITAMIN B-1) tablet 100 mg  100 mg Oral Daily Laverle Hobby, PA-C   100 mg at 10/27/14 2426  . traMADol (ULTRAM) tablet 50 mg  50 mg Oral Q6H PRN Nicholaus Bloom, MD   50 mg at 10/27/14 1304  . zolpidem (AMBIEN) tablet 10 mg  10 mg Oral QHS PRN Nicholaus Bloom, MD   10 mg at 10/26/14 2212    Lab Results: No results found for this or any previous visit (from the past 21 hour(s)).  Physical Findings: AIMS: Facial and Oral Movements Muscles of Facial Expression: None, normal Lips and Perioral Area: None, normal Jaw: None, normal Tongue: None, normal,Extremity Movements Upper (arms, wrists, hands, fingers): None, normal Lower (legs, knees, ankles, toes): None, normal, Trunk Movements Neck, shoulders, hips: None, normal, Overall Severity Severity of abnormal movements (highest score from questions above): None, normal Incapacitation due to abnormal movements: None, normal Patient's awareness of abnormal movements (rate only patient's report): No Awareness, Dental Status Current problems with teeth and/or dentures?: No Does patient usually wear dentures?: No  CIWA:  CIWA-Ar Total: 6 COWS:  COWS Total Score: 5  Treatment Plan Summary: Daily contact with patient to assess and evaluate symptoms and progress in treatment and Medication management Supportive approach/coping skills/relapse prevention Alcohol Dependence; continue to work  a relapse prevention plan Anxiety/residual withdrawal; will use the Ativan PRN on a short term basis Auditory Hallucinations; pursue the Seroquel. Again he states he does not want to take more medication as he has gotten used to the voices. Would rather have the voices in the background than the side effects from higher dosages of medication Work on ways to be better cope with the voices when they get louder   Medical Decision Making:  Review of Psycho-Social Stressors (1), Review or order clinical lab tests (1), Review of Medication Regimen & Side Effects (2) and Review of New Medication or Change in Dosage (2)     Jozef Eisenbeis A 10/27/2014, 4:44 PM

## 2014-10-27 NOTE — Tx Team (Signed)
Interdisciplinary Treatment Plan Update (Adult)   Date: 10/27/2014  Time Reviewed: 9:30AM Progress in Treatment:  Attending groups: Yes  Participating in groups:  Yes  Taking medication as prescribed: Yes  Tolerating medication: Yes  Family/Significant othe contact made: Yes, CSW has spoken with patient's mother. Patient understands diagnosis: Yes, AEB seeking treatment for ETOH detox, AVH, depression, and for medication stabilization. Pt also requesting treatment for pain issues associated with recent fall down a flight of stairs. MD notified during tx team.  Discussing patient identified problems/goals with staff: Yes  Medical problems stabilized or resolved: Yes  Denies suicidal/homicidal ideation: Yes during group/self report.   Patient has not harmed self or Others: Yes  New problem(s) identified:  Discharge Plan or Barriers: Pt plans to return home at d/c by [facility] or bus. He plans to continue follow-up at [facility]. CSW assessing.  Additional comments: Pt presents to [facility] for ETOH detox, AH, depression, and for med stabilization. Affect/ mood depressed and anxious. Pt reports daily alcohol use of 3 or more 24oz malt liquors and is now requesting detox. "I don't want to drink anymore, I need to get stabilized and go back home". Pt had a recent fall, down a flight of steps, was treated in ED and hospitalized for medical issues. Since fall pt reports generalized weakness and back pain, see MAR. Multiple bruises noted to back, legs and thighs. Pt gait is unsteady, requiring a walker and standby assist with ADLs. -SI/HI, verbally contracts for safety. +A/[place] " hear them laughing at me, see shadows of people and aliens". Reports PTSD related to fiancee committing suicide by gun shot in front of him, 21yrs ago. "I need to get my life back in order". Emotional support and encouragement given.   Reason for Continuation of Hospitalization: Medication management Depression/mood instability Estimated  length of stay: Discharge anticipated for Monday 3/14. For review of initial/current patient goals, please see plan of care.  Attendees: Patient:    Family:    Physician: Dr. Sabra Heck 10/27/2014 9:30 AM  Nursing: Charlyne Quale, [place], [place] 10/27/2014 9:30 AM  Clinical Social Worker: Tilden Fossa, [place] 10/27/2014 9:30 AM  Other: Joette Catching, LCSW 10/27/2014 9:30 AM  Other: Lucinda Dell, [place] Liaison  10/27/2014 9:30 AM  Other: Lars Pinks, Case Manager 10/27/2014 9:30 AM  Other: Ave Filter NP 10/27/2014 9:30 AM  Other:     Tilden Fossa, MSW, [place] Worker [facility] [phone]

## 2014-10-27 NOTE — Progress Notes (Signed)
Recreation Therapy Notes  Date: 03.11.2016 Time: 9:30am Location: [address] Group Room   Group Topic: Stress Management  Goal Area(s) Addresses:  Patient will actively participate in stress management techniques presented during session.   Behavioral Response: Engaged, Attentive.   Intervention: Stress Management  Activity :  Body Scanning & Deep Breathing. Patient participated in deep breathing and progressive body scanning, starting with their toes and moving up their bodies to their heads.   Education:  Stress Management, Discharge Planning.   Education Outcome: Acknowledges education  Clinical Observations/Feedback: Patient actively participated in both techniques, voicing no concerns during session and displaying ability to practice technique independently post d/c.   Laureen Ochs Tal[place], LRT/CTRS  Delainey Winstanley L 10/27/2014 10:23 AM

## 2014-10-28 NOTE — BHH Group Notes (Signed)
[place] Group Notes:  (Clinical Social Work)  10/28/2014     10-11AM  Summary of Progress/Problems:   The main focus of today's process group was to learn how to use a decisional balance exercise to move forward in the Stages of Change, which were described and discussed.  Motivational Interviewing and a worksheet were utilized to help patients explore in depth the perceived benefits and costs of a self-sabotaging behavior, as well as the  benefits and costs of replacing that with a healthy coping mechanism.   The patient expressed that his primary unhealthy coping skill is isolation.  He contributed significantly throughout group, was excited about the exercises performed.  Type of Therapy:  Group Therapy - Process   Participation Level:  Active  Participation Quality:  Attentive, Sharing and Supportive  Affect:  Blunted  Cognitive:  Alert, Appropriate and Oriented  Insight:  Engaged  Engagement in Therapy:  Engaged  Modes of Intervention:  Education, Motivational Interviewing  Selmer Dominion, LCSW 10/28/2014, 12:31 PM

## 2014-10-28 NOTE — Progress Notes (Signed)
Psychoeducational Group Note  Date:  10/28/2014 Time: 1015  Group Topic/Focus:  Identifying Needs:   The focus of this group is to help patients identify their personal needs that have been historically problematic and identify healthy behaviors to address their needs.  Participation Level:  Active  Participation Quality:  Attentive  Affect:  Depressed  Cognitive:  Alert  Insight:  Engaged  Engagement in Group:  Engaged  Additional Comments:    10/28/2014,4:21 PM Traycen Goyer, Trixie Rude

## 2014-10-28 NOTE — Progress Notes (Signed)
[name] seen on day room watching TV and interacting with peers. Patient continue to endorse foot pain and keep requesting cold pack. Denies SI, AH/VH at this time. Accepted all due medications. No new complaint. Every 15 minutes check maintained for safety. Will continue to monitor patient.

## 2014-10-28 NOTE — Progress Notes (Signed)
[facility] MD Progress Note  10/28/2014 5:41 PM [name]  MRN:  [id number] Subjective:  States that he is slowly starting to feel better. He is anticipating that having his mother stay with him is going to be beneficial as he will have to look after her. States he is close to his brother and he is supportive of him. The voices he says are not any better or any worst. He can tolerate them the way they are now. He states that he is committed to abstinence as does not want to ever go trough this again  Principal Problem: Schizophrenia, schizoaffective, chronic with acute exacerbation Diagnosis:   Patient Active Problem List   Diagnosis Date Noted  . Schizoaffective disorder [F25.9] 12/29/2013    Priority: High  . Alcohol dependence [F10.20] 11/03/2011    Priority: High  . Schizophrenia, schizoaffective, chronic with acute exacerbation [F25.8] 10/24/2014  . Alcohol intoxication [F10.129]   . Chronic posttraumatic stress disorder [F43.12]   . Protein-calorie malnutrition, severe [E43] 10/19/2014  . Hyponatremia [E87.1] 10/18/2014  . Elevated LFTs [R79.89]   . Alcohol abuse with physiological dependence [F10.20]   . Hallucinations, visual [R44.1]   . Noncompliance with medication regimen [Z91.14]   . Hallucinations [R44.3] 07/07/2014  . Paranoia [F22] 07/07/2014  . Abnormal liver enzymes [R74.8] 02/14/2014  . Cannabis abuse [F12.10] 12/30/2013  . Post traumatic stress disorder (PTSD) [F43.10] 11/03/2011  . Active smoker [Z72.0] 06/30/2011   Total Time spent with patient: 30 minutes   Past Medical History:  Past Medical History  Diagnosis Date  . Depression   . Psychosis   . PTSD (post-traumatic stress disorder)   . Seizure disorder     related to etoh seizure  . Alcohol abuse   . Schizoaffective disorder   . Anxiety     Past Surgical History  Procedure Laterality Date  . Foot surgery      right  . Hand surgery     Family History:  Family History  Problem Relation Age of  Onset  . Alcoholism Mother   . CAD Father   . Depression Brother    Social History:  History  Alcohol Use  . 50.4 oz/week  . 84 Cans of beer per week    Comment: 6-8 cans beer per day, 24 ounce cans for past 1 month     History  Drug Use  . 1.00 per week  . Special: Marijuana    Comment: THC 2 to 3 times per month    History   Social History  . Marital Status: Single    Spouse Name: N/A  . Number of Children: N/A  . Years of Education: N/A   Social History Main Topics  . Smoking status: Current Every Day Smoker -- 1.50 packs/day for 24 years    Types: Cigarettes  . Smokeless tobacco: Never Used  . Alcohol Use: 50.4 oz/week    84 Cans of beer per week     Comment: 6-8 cans beer per day, 24 ounce cans for past 1 month  . Drug Use: 1.00 per week    Special: Marijuana     Comment: THC 2 to 3 times per month  . Sexual Activity: Yes   Other Topics Concern  . None   Social History Narrative   Additional History:    Sleep: Fair  Appetite:  Fair   Assessment:   Musculoskeletal: Strength & Muscle Tone: within normal limits Gait & Station: normal Patient leans: N/A   Psychiatric Specialty  Exam: Physical Exam  Review of Systems  HENT: Negative.   Eyes: Negative.   Respiratory: Negative.   Cardiovascular: Negative.   Gastrointestinal: Negative.   Genitourinary: Negative.   Musculoskeletal: Positive for back pain.  Skin: Negative.   Neurological: Positive for weakness.  Endo/Heme/Allergies: Negative.   Psychiatric/Behavioral: Positive for substance abuse. The patient is nervous/anxious and has insomnia.     Blood pressure 113/70, pulse 75, temperature 98.2 F (36.8 C), temperature source Oral, resp. rate 16, height 6' (1.829 m), weight 89.812 kg (198 lb), SpO2 96 %.Body mass index is 26.85 kg/(m^2).  General Appearance: Fairly Groomed  Engineer, water::  Fair  Speech:  Clear and Coherent and not spontaneous  Volume:  Decreased  Mood:  Anxious  Affect:   Restricted  Thought Process:  Coherent and Goal Directed  Orientation:  Full (Time, Place, and Person)  Thought Content:  symptoms events worries concerns  Suicidal Thoughts:  No  Homicidal Thoughts:  No  Memory:  Immediate;   Fair Recent;   Fair Remote;   Fair  Judgement:  Fair  Insight:  Present and Shallow  Psychomotor Activity:  Normal  Concentration:  Fair  Recall:  [employer] of Knowledge:Fair  Language: Fair  Akathisia:  No  Handed:  Right  AIMS (if indicated):     Assets:  Desire for Improvement Housing Social Support  ADL's:  Intact  Cognition: WNL  Sleep:  Number of Hours: 4     Current Medications: Current Facility-Administered Medications  Medication Dose Route Frequency Provider Last Rate Last Dose  . acetaminophen (TYLENOL) tablet 650 mg  650 mg Oral Q6H PRN Laverle Hobby, PA-C      . alum & mag hydroxide-simeth (MAALOX/MYLANTA) 200-200-20 MG/5ML suspension 30 mL  30 mL Oral Q4H PRN Laverle Hobby, PA-C      . docusate sodium (COLACE) capsule 100 mg  100 mg Oral BID Laverle Hobby, PA-C   100 mg at 10/28/14 1700  . feeding supplement (ENSURE COMPLETE) (ENSURE COMPLETE) liquid 237 mL  237 mL Oral BID BM Clayton Bibles, RD   237 mL at 10/28/14 1659  . folic acid (FOLVITE) tablet 1 mg  1 mg Oral Daily Laverle Hobby, PA-C   1 mg at 10/28/14 0750  . gabapentin (NEURONTIN) capsule 300 mg  300 mg Oral QHS Laverle Hobby, PA-C   300 mg at 10/27/14 2128  . lithium carbonate capsule 300 mg  300 mg Oral BID WC Laverle Hobby, PA-C   300 mg at 10/28/14 1659  . LORazepam (ATIVAN) tablet 1 mg  1 mg Oral Q6H PRN Nicholaus Bloom, MD   1 mg at 10/28/14 1457  . magnesium hydroxide (MILK OF MAGNESIA) suspension 30 mL  30 mL Oral Daily PRN Laverle Hobby, PA-C      . multivitamin with minerals tablet 1 tablet  1 tablet Oral Daily Laverle Hobby, PA-C   1 tablet at 10/28/14 0750  . naproxen (NAPROSYN) tablet 500 mg  500 mg Oral BID PRN Laverle Hobby, PA-C   500 mg at  10/25/14 4166  . nicotine (NICODERM CQ - dosed in mg/24 hours) patch 21 mg  21 mg Transdermal Daily Laverle Hobby, PA-C   21 mg at 10/28/14 0747  . QUEtiapine (SEROQUEL) tablet 300 mg  300 mg Oral QHS Laverle Hobby, PA-C   300 mg at 10/27/14 2127  . sertraline (ZOLOFT) tablet 50 mg  50 mg Oral Daily Frederico Hamman E  Simon, PA-C   50 mg at 10/28/14 0749  . thiamine (VITAMIN B-1) tablet 100 mg  100 mg Oral Daily Laverle Hobby, PA-C   100 mg at 10/28/14 0750  . traMADol (ULTRAM) tablet 50 mg  50 mg Oral Q6H PRN Nicholaus Bloom, MD   50 mg at 10/28/14 1457  . zolpidem (AMBIEN) tablet 10 mg  10 mg Oral QHS PRN Nicholaus Bloom, MD   10 mg at 10/27/14 2201    Lab Results: No results found for this or any previous visit (from the past 48 hour(s)).  Physical Findings: AIMS: Facial and Oral Movements Muscles of Facial Expression: None, normal Lips and Perioral Area: None, normal Jaw: None, normal Tongue: None, normal,Extremity Movements Upper (arms, wrists, hands, fingers): None, normal Lower (legs, knees, ankles, toes): None, normal, Trunk Movements Neck, shoulders, hips: None, normal, Overall Severity Severity of abnormal movements (highest score from questions above): None, normal Incapacitation due to abnormal movements: None, normal Patient's awareness of abnormal movements (rate only patient's report): No Awareness, Dental Status Current problems with teeth and/or dentures?: No Does patient usually wear dentures?: No  CIWA:  CIWA-Ar Total: 6 COWS:  COWS Total Score: 5  Treatment Plan Summary: Daily contact with patient to assess and evaluate symptoms and progress in treatment and Medication management Alcohol Dependence: monitor and address any residual withdrawal symptoms Continue to work a relapse prevention plan Hallucinations: continue the Seroquel and optimize response Anxiety, depression: continue the Zoloft and work with CBT, mindfulness  Medical Decision Making:  Review of  Psycho-Social Stressors (1), Review or order clinical lab tests (1), Review of Medication Regimen & Side Effects (2) and Review of New Medication or Change in Dosage (2)     Petra Sargeant A 10/28/2014, 5:41 PM

## 2014-10-28 NOTE — Progress Notes (Signed)
D) Pt rates his depression at a 5, hopelessness at a 4 and his anxiety at a 10. Pt denies SI and HI. Has attended all the groups and interacts with his peers appropriately.  A) Given support, reassurance and praise. Encouragement provided. R) Pt denies SI and HI.

## 2014-10-28 NOTE — Progress Notes (Signed)
.  Psychoeducational Group Note    Date: 10/28/2014 Time:  0930    Goal Setting Purpose of Group: To be able to set a goal that is measurable and that can be accomplished in one day Participation Level:  Active  Participation Quality:  Appropriate  Affect:  Appropriate  Cognitive:  Oriented  Insight:  Improving  Engagement in Group:  Engaged  Additional Comments:  Pt attending the group and participating appropriately  [name]

## 2014-10-28 NOTE — Progress Notes (Signed)
Patient complained of back pain of 9/10 and anxiety. Requested and received Tramadol 50 mg for pain and Ativan 1 mg for anxiety respectively. Patient also requested "Ambien for sleep". 10 mg given as ordered. Patient denies SI, AH/VH at this time. All due medications given as ordered. Patient encouraged to continue with the treatment plan and to verbalize needs to staff. Every 15 minutes check for safety maintained. Will continue to monitor patient.

## 2014-10-29 NOTE — Progress Notes (Signed)
Patient did attend the evening speaker [place] meeting. Pt was engaged and supportive during the meeting.

## 2014-10-29 NOTE — Progress Notes (Signed)
Psychoeducational Group Note  Date: 10/29/2014 Time: 1315  Group Topic/Focus:  Making Healthy Choices:   The focus of this group is to help patients identify negative/unhealthy choices they were using prior to admission and identify positive/healthier coping strategies to replace them upon discharge.  Participation Level:  Active  Participation Quality:  Appropriate  Affect:  Anxious  Cognitive:  Appropriate  Insight:  Engaged  Engagement in Group:  Engaged  Additional Comments:    10/29/2014,4:56 PM Sherrice Creekmore, Trixie Rude

## 2014-10-29 NOTE — Progress Notes (Signed)
D) Pt has been attending the groups and interacting with his peers appropriately. Pt rates his depression at a 4 his hopelessness at a 2 and his anxiety at an 8. Affect is less intense and has been laughing and joking with his peers. Has been verbalizing and increase in his mood. A) Pt given support, reassurance and praise along with encouragement. Provided with a 1:1. Given the tramadol and ativan that he requests every 6 hours. R) Pt denies SI and HI. Depressed mood is lifting and Pt is feeling more empowered and less helpless.

## 2014-10-29 NOTE — Progress Notes (Signed)
[facility] MD Progress Note  10/29/2014 3:49 PM [name]  MRN:  [id number] Subjective:  Esequiel states that he is starting to feel better. He is getting stronger and feels that soon he will able to walk without the walker. States that he is looking forward of having his mother stay with him. He again says this will give him a sense of purpose. He continues to affirm his commitment to abstinence.  Principal Problem: Schizophrenia, schizoaffective, chronic with acute exacerbation Diagnosis:   Patient Active Problem List   Diagnosis Date Noted  . Schizoaffective disorder [F25.9] 12/29/2013    Priority: High  . Alcohol dependence [F10.20] 11/03/2011    Priority: High  . Schizophrenia, schizoaffective, chronic with acute exacerbation [F25.8] 10/24/2014  . Alcohol intoxication [F10.129]   . Chronic posttraumatic stress disorder [F43.12]   . Protein-calorie malnutrition, severe [E43] 10/19/2014  . Hyponatremia [E87.1] 10/18/2014  . Elevated LFTs [R79.89]   . Alcohol abuse with physiological dependence [F10.20]   . Hallucinations, visual [R44.1]   . Noncompliance with medication regimen [Z91.14]   . Hallucinations [R44.3] 07/07/2014  . Paranoia [F22] 07/07/2014  . Abnormal liver enzymes [R74.8] 02/14/2014  . Cannabis abuse [F12.10] 12/30/2013  . Post traumatic stress disorder (PTSD) [F43.10] 11/03/2011  . Active smoker [Z72.0] 06/30/2011   Total Time spent with patient: 30 minutes   Past Medical History:  Past Medical History  Diagnosis Date  . Depression   . Psychosis   . PTSD (post-traumatic stress disorder)   . Seizure disorder     related to etoh seizure  . Alcohol abuse   . Schizoaffective disorder   . Anxiety     Past Surgical History  Procedure Laterality Date  . Foot surgery      right  . Hand surgery     Family History:  Family History  Problem Relation Age of Onset  . Alcoholism Mother   . CAD Father   . Depression Brother    Social History:  History   Alcohol Use  . 50.4 oz/week  . 84 Cans of beer per week    Comment: 6-8 cans beer per day, 24 ounce cans for past 1 month     History  Drug Use  . 1.00 per week  . Special: Marijuana    Comment: THC 2 to 3 times per month    History   Social History  . Marital Status: Single    Spouse Name: N/A  . Number of Children: N/A  . Years of Education: N/A   Social History Main Topics  . Smoking status: Current Every Day Smoker -- 1.50 packs/day for 24 years    Types: Cigarettes  . Smokeless tobacco: Never Used  . Alcohol Use: 50.4 oz/week    84 Cans of beer per week     Comment: 6-8 cans beer per day, 24 ounce cans for past 1 month  . Drug Use: 1.00 per week    Special: Marijuana     Comment: THC 2 to 3 times per month  . Sexual Activity: Yes   Other Topics Concern  . None   Social History Narrative   Additional History:    Sleep: Fair  Appetite:  Fair   Assessment:   Musculoskeletal: Strength & Muscle Tone: within normal limits Gait & Station: normal Patient leans: N/A   Psychiatric Specialty Exam: Physical Exam  Review of Systems  Constitutional: Negative.   HENT: Negative.   Eyes: Negative.   Respiratory: Negative.   Cardiovascular: Negative.  Gastrointestinal: Negative.   Genitourinary: Negative.   Musculoskeletal: Positive for back pain.  Skin: Negative.   Neurological: Negative.   Endo/Heme/Allergies: Negative.   Psychiatric/Behavioral: Positive for substance abuse. The patient is nervous/anxious.     Blood pressure 101/68, pulse 106, temperature 98.3 F (36.8 C), temperature source Oral, resp. rate 16, height 6' (1.829 m), weight 89.812 kg (198 lb), SpO2 96 %.Body mass index is 26.85 kg/(m^2).  General Appearance: Fairly Groomed  Engineer, water::  Fair  Speech:  Clear and Coherent  Volume:  Normal  Mood:  Anxious  Affect:  anxious  Thought Process:  Coherent and Goal Directed  Orientation:  Full (Time, Place, and Person)  Thought Content:   symptoms plans as he moves on, relapse prevention plan  Suicidal Thoughts:  No  Homicidal Thoughts:  No  Memory:  Immediate;   Fair Recent;   Fair Remote;   Fair  Judgement:  Fair  Insight:  Present  Psychomotor Activity:  Normal  Concentration:  Fair  Recall:  [employer] of [place]  Language: Fair  Akathisia:  No  Handed:  Right  AIMS (if indicated):     Assets:  Desire for Improvement Housing Social Support  ADL's:  Intact  Cognition: WNL  Sleep:  Number of Hours: 4     Current Medications: Current Facility-Administered Medications  Medication Dose Route Frequency Provider Last Rate Last Dose  . acetaminophen (TYLENOL) tablet 650 mg  650 mg Oral Q6H PRN Laverle Hobby, PA-C      . alum & mag hydroxide-simeth (MAALOX/MYLANTA) 200-200-20 MG/5ML suspension 30 mL  30 mL Oral Q4H PRN Laverle Hobby, PA-C      . docusate sodium (COLACE) capsule 100 mg  100 mg Oral BID Laverle Hobby, PA-C   100 mg at 10/28/14 1700  . feeding supplement (ENSURE COMPLETE) (ENSURE COMPLETE) liquid 237 mL  237 mL Oral BID BM Clayton Bibles, RD   237 mL at 10/28/14 1659  . folic acid (FOLVITE) tablet 1 mg  1 mg Oral Daily Laverle Hobby, PA-C   1 mg at 10/29/14 6629  . gabapentin (NEURONTIN) capsule 300 mg  300 mg Oral QHS Laverle Hobby, PA-C   300 mg at 10/29/14 4765  . lithium carbonate capsule 300 mg  300 mg Oral BID WC Laverle Hobby, PA-C   300 mg at 10/29/14 4650  . LORazepam (ATIVAN) tablet 1 mg  1 mg Oral Q6H PRN Nicholaus Bloom, MD   1 mg at 10/29/14 1112  . magnesium hydroxide (MILK OF MAGNESIA) suspension 30 mL  30 mL Oral Daily PRN Laverle Hobby, PA-C      . multivitamin with minerals tablet 1 tablet  1 tablet Oral Daily Laverle Hobby, PA-C   1 tablet at 10/29/14 3546  . naproxen (NAPROSYN) tablet 500 mg  500 mg Oral BID PRN Laverle Hobby, PA-C   500 mg at 10/25/14 5681  . nicotine (NICODERM CQ - dosed in mg/24 hours) patch 21 mg  21 mg Transdermal Daily Laverle Hobby,  PA-C   21 mg at 10/29/14 2751  . QUEtiapine (SEROQUEL) tablet 300 mg  300 mg Oral QHS Laverle Hobby, PA-C   300 mg at 10/28/14 2124  . sertraline (ZOLOFT) tablet 50 mg  50 mg Oral Daily Laverle Hobby, PA-C   50 mg at 10/29/14 7001  . thiamine (VITAMIN B-1) tablet 100 mg  100 mg Oral Daily Laverle Hobby, PA-C  100 mg at 10/29/14 0806  . traMADol (ULTRAM) tablet 50 mg  50 mg Oral Q6H PRN Nicholaus Bloom, MD   50 mg at 10/29/14 1112  . zolpidem (AMBIEN) tablet 10 mg  10 mg Oral QHS PRN Nicholaus Bloom, MD   10 mg at 10/28/14 2203    Lab Results: No results found for this or any previous visit (from the past 48 hour(s)).  Physical Findings: AIMS: Facial and Oral Movements Muscles of Facial Expression: None, normal Lips and Perioral Area: None, normal Jaw: None, normal Tongue: None, normal,Extremity Movements Upper (arms, wrists, hands, fingers): None, normal Lower (legs, knees, ankles, toes): None, normal, Trunk Movements Neck, shoulders, hips: None, normal, Overall Severity Severity of abnormal movements (highest score from questions above): None, normal Incapacitation due to abnormal movements: None, normal Patient's awareness of abnormal movements (rate only patient's report): No Awareness, Dental Status Current problems with teeth and/or dentures?: No Does patient usually wear dentures?: No  CIWA:  CIWA-Ar Total: 6 COWS:  COWS Total Score: 5  Treatment Plan Summary: Daily contact with patient to assess and evaluate symptoms and progress in treatment and Medication management Alcohol Dependence; address residual withdrawal symptoms                                    Continue to work a relapse prevention plan As we anticipate D/C tomorrow coordinate for outpatient PT if necessary Depression; continue the Zoloft Hallucinations; continue the Seroquel; again he would rather have the voices in the background rather than risk side effects with a higher dose Pain: will continue to use  the Neurontin and the Ultram ( at least short term) Reassess for D/C in the AM  Medical Decision Making:  Review of Psycho-Social Stressors (1), Review of Medication Regimen & Side Effects (2) and Review of New Medication or Change in Dosage (2)     Jilda Kress A 10/29/2014, 3:49 PM

## 2014-10-29 NOTE — Progress Notes (Signed)
[facility] Group Notes:  (Nursing/MHT/Case Management/Adjunct)  Date:  10/29/2014  Time:  1:12 AM  Type of Therapy:  Psychoeducational Skills  Participation Level:  Active  Participation Quality:  Appropriate  Affect:  Appropriate  Cognitive:  Appropriate  Insight:  Good  Engagement in Group:  Engaged  Modes of Intervention:  Education  Summary of Progress/Problems: The patient shared with the group that he had a good day despite the fact that he is dealing with some pain in his ankle. The patient explained that he "over did it" while he was completing his physical therapy exercises. He enjoyed all of the groups that were offered. In terms of the theme for the day, his support system will be comprised of his family and Mental Health.   Josefita Weissmann S 10/29/2014, 1:12 AM

## 2014-10-29 NOTE — [facility] Group Notes (Signed)
[facility] Group Notes:  (Clinical Social Work)  10/29/2014  10:00-11:00AM  Summary of Progress/Problems:   The main focus of today's process group was to   1)  discuss the importance of adding supports  2)  define health supports versus unhealthy supports  3)  identify the patient's current unhealthy supports and plan how to handle them  4)  Identify the patient's current healthy supports and plan what to add.  An emphasis was placed on using counselor, doctor, therapy groups, 12-step groups, and problem-specific support groups to expand supports.    The patient expressed full comprehension of the concepts presented, and agreed that there is a need to add more supports.  The patient stated that he plans to go to a Schizophrenia support group, and although he knows about [facility] has been too shy to go so far.  He is going to talk to his sister with "I" statements about coming with him to doctor's appointments to learn more about his diagnosis, and offer to go with her to her rheumatology appointments.  Type of Therapy:  Process Group with Motivational Interviewing  Participation Level:  Active  Participation Quality:  Attentive, Sharing and Supportive  Affect:  Appropriate  Cognitive:  Alert, Appropriate and Oriented  Insight:  Engaged  Engagement in Therapy:  Engaged  Modes of Intervention:   Education, Support and Processing, Activity  [employer], LCSW 10/29/2014, 12:15pm

## 2014-10-30 DIAGNOSIS — F10230 Alcohol dependence with withdrawal, uncomplicated: Secondary | ICD-10-CM | POA: Insufficient documentation

## 2014-10-30 MED ORDER — NICOTINE 21 MG/24HR TD PT24: 21.0000 mg | MEDICATED_PATCH | Freq: Every day | TRANSDERMAL | Status: DC

## 2014-10-30 MED ORDER — LORAZEPAM 1 MG PO TABS: ORAL_TABLET | ORAL | Status: DC

## 2014-10-30 MED ORDER — TRAMADOL HCL 50 MG PO TABS: 50.0000 mg | ORAL_TABLET | Freq: Four times a day (QID) | ORAL | Status: DC | PRN

## 2014-10-30 MED ORDER — DOCUSATE SODIUM 100 MG PO CAPS: 100.0000 mg | ORAL_CAPSULE | Freq: Two times a day (BID) | ORAL | Status: DC

## 2014-10-30 MED ORDER — ENSURE COMPLETE PO LIQD: 237.0000 mL | Freq: Two times a day (BID) | ORAL | Status: DC

## 2014-10-30 MED ORDER — ADULT MULTIVITAMIN W/MINERALS CH: 1.0000 | ORAL_TABLET | Freq: Every day | ORAL | Status: DC

## 2014-10-30 MED ORDER — QUETIAPINE FUMARATE 300 MG PO TABS: 300.0000 mg | ORAL_TABLET | Freq: Every day | ORAL | Status: DC

## 2014-10-30 MED ORDER — THIAMINE HCL 100 MG PO TABS: 100.0000 mg | ORAL_TABLET | Freq: Every day | ORAL | Status: DC

## 2014-10-30 MED ORDER — ZOLPIDEM TARTRATE 10 MG PO TABS: 10.0000 mg | ORAL_TABLET | Freq: Every evening | ORAL | Status: DC | PRN

## 2014-10-30 MED ORDER — NAPROXEN SODIUM 220 MG PO TABS: 220.0000 mg | ORAL_TABLET | Freq: Every day | ORAL | Status: DC | PRN

## 2014-10-30 MED ORDER — LITHIUM CARBONATE 300 MG PO CAPS: 300.0000 mg | ORAL_CAPSULE | Freq: Two times a day (BID) | ORAL | Status: DC

## 2014-10-30 MED ORDER — GABAPENTIN 300 MG PO CAPS: 300.0000 mg | ORAL_CAPSULE | Freq: Every day | ORAL | Status: DC

## 2014-10-30 MED ORDER — SERTRALINE HCL 50 MG PO TABS: 50.0000 mg | ORAL_TABLET | Freq: Every day | ORAL | Status: DC

## 2014-10-30 MED ORDER — FOLIC ACID 1 MG PO TABS: 1.0000 mg | ORAL_TABLET | Freq: Every day | ORAL | Status: DC

## 2014-10-30 NOTE — Progress Notes (Signed)
Discharge Note:  Patient discharged home with family member.  Denied SI and HI.  Denied A/V hallucinations.  Suicide prevention information given and discussed with patient who stated he understood and had no questions.  Patient stated he received all his belongings, clothing, toiletries, misc items, prescriptions, medications.  Patient stated he appreciated all assistance received from [facility] staff.

## 2014-10-30 NOTE — Discharge Summary (Signed)
Physician Discharge Summary Note  Patient:  [name] is an 44 y.o., male MRN:  [id number] DOB:  02/27/1971 Patient phone:  [phone] (home)  Patient address:   [address] [place] [address],  Total Time spent with patient: Greter than 30 minutes  Date of Admission:  10/24/2014  Date of Discharge: 10-29-14  Reason for Admission:  Mood stabilization treatment  Principal Problem: Schizophrenia, schizoaffective, chronic with acute exacerbation Discharge Diagnoses: Patient Active Problem List   Diagnosis Date Noted  . Schizophrenia, schizoaffective, chronic with acute exacerbation [F25.8] 10/24/2014  . Alcohol intoxication [F10.129]   . Chronic posttraumatic stress disorder [F43.12]   . Protein-calorie malnutrition, severe [E43] 10/19/2014  . Hyponatremia [E87.1] 10/18/2014  . Elevated LFTs [R79.89]   . Alcohol abuse with physiological dependence [F10.20]   . Hallucinations, visual [R44.1]   . Noncompliance with medication regimen [Z91.14]   . Hallucinations [R44.3] 07/07/2014  . Paranoia [F22] 07/07/2014  . Abnormal liver enzymes [R74.8] 02/14/2014  . Cannabis abuse [F12.10] 12/30/2013  . Schizoaffective disorder [F25.9] 12/29/2013  . Post traumatic stress disorder (PTSD) [F43.10] 11/03/2011  . Alcohol dependence [F10.20] 11/03/2011  . Active smoker [Z72.0] 06/30/2011   Musculoskeletal: Strength & Muscle Tone: within normal limits Gait & Station: normal Patient leans: N/A  Psychiatric Specialty Exam: Physical Exam  Psychiatric: His speech is normal and behavior is normal. Judgment and thought content normal. His mood appears not anxious. His affect is not angry, not blunt, not labile and not inappropriate. Cognition and memory are normal. He does not exhibit a depressed mood.    Review of Systems  Constitutional: Negative.   Eyes: Negative.   Respiratory: Negative.   Cardiovascular: Negative.   Gastrointestinal: Negative.   Genitourinary:  Negative.   Musculoskeletal: Negative.   Skin: Negative.   Neurological: Negative.   Endo/Heme/Allergies: Negative.   Psychiatric/Behavioral: Positive for depression (Stable) and substance abuse (Alcoholism, chronic). Negative for suicidal ideas, hallucinations and memory loss. The patient has insomnia (Stable). The patient is not nervous/anxious.     Blood pressure 116/67, pulse 93, temperature 98.2 F (36.8 C), temperature source Oral, resp. rate 18, height 6' (1.829 m), weight 89.812 kg (198 lb), SpO2 96 %.Body mass index is 26.85 kg/(m^2).  See Md's SRA   Past Medical History:  Past Medical History  Diagnosis Date  . Depression   . Psychosis   . PTSD (post-traumatic stress disorder)   . Seizure disorder     related to etoh seizure  . Alcohol abuse   . Schizoaffective disorder   . Anxiety     Past Surgical History  Procedure Laterality Date  . Foot surgery      right  . Hand surgery     Family History:  Family History  Problem Relation Age of Onset  . Alcoholism Mother   . CAD Father   . Depression Brother    Social History:  History  Alcohol Use  . 50.4 oz/week  . 84 Cans of beer per week    Comment: 6-8 cans beer per day, 24 ounce cans for past 1 month     History  Drug Use  . 1.00 per week  . Special: Marijuana    Comment: THC 2 to 3 times per month    History   Social History  . Marital Status: Single    Spouse Name: N/A  . Number of Children: N/A  . Years of Education: N/A   Social History Main Topics  . Smoking status: Current Every  Day Smoker -- 1.50 packs/day for 24 years    Types: Cigarettes  . Smokeless tobacco: Never Used  . Alcohol Use: 50.4 oz/week    84 Cans of beer per week     Comment: 6-8 cans beer per day, 24 ounce cans for past 1 month  . Drug Use: 1.00 per week    Special: Marijuana     Comment: THC 2 to 3 times per month  . Sexual Activity: Yes   Other Topics Concern  . None   Social History Narrative   Risk to Self: Is  patient at risk for suicide?: No  Risk to Others: No   Prior Inpatient Therapy: No  Prior Outpatient Therapy: No  Level of Care:  OP  Hospital Course:  [name], 44 y.o. male patient admitted with alcohol intoxication and having suicidal thoughts on the medical floor at [facility]. He was having AVH, suicidal thoughts with a plan to kill self. BAC was 362 with elevated liver enzymes. He states that his depression had been worsening in the past few months and he started having suicidal thoughts an images. Reports that his meds were not working very well.  Loman was admitted to the hospital with his BAL at 362 per toxicology test results & UDS positive for THC. He was intoxicated with reports of auditory/visual hallucination. He was here for alcohol/drug detox. His detoxification treatment was achieved using Ativan detox regimen on a tapering dose format. This was used in place of Librium detox protocol because Librium is a long acting benzodiazepine with a long half life. If Librium was used for this detox treatment will leave a lingering adverse effects in his system.  By using ativan detox regimen, Legrand Como received a cleaner detoxification treatment without the lingering adverse effects of the Librium capsules. He was also enrolled in the group counseling sessions, AA/NA meetings being offered and held on this unit. He participated and learned coping skills. Carmino received other medication regimen for his other medical conditions presented. He tolerated his treatment regimen without any significant adverse effects and or reactions.  Besides the treatments received here and scheduled outpatient psychiatric services, [place] also was medicated and discharged on Ambien 10 mg at bedtime for insomnia, Gabapentin 300 Q bedtime for agitation, Seroquel 300 mg Q bedtime for mood control, Sertraline 50 mg for depression,, Nicotine patch Q 24 hours for nicotine dependence, Lithium carbonate 300  mg for mood stabilization, folic acid 1 mg for low folate & thiamine 100 mg for low thiamine. He was provided with a prescription on ativan 1 mg tapering dose format to complete at home. Reford has completed detox treatment and his mood is stable. This is evidenced by his reports of improved mood and absence substance withdrawal symptoms. He will resume psychiatric care and routine medication management at the [facility] clinic here in [place], [place]. Patient was encouraged to join/attend AA/NA meetings being offered and held within his community.   Upon discharge, Joshawn adamantly denies any suicidal, homicidal ideations, auditory, visual hallucinations, delusional thoughts, paranoia and or withdrawal symptoms. He left [facility] with all personal belongings in no apparent distress. He received 4 days worth supply samples of his [facility] discharge medications provided by [facility] pharmacy. Transportation per city bus. Bus pass provided by [facility].  Consults:  psychiatry  Significant Diagnostic Studies:  labs: CBC with diff, CMP, UDS, toxicology tests, U/A, results reviewed, stable  Discharge Vitals:   Blood pressure 116/67, pulse 93, temperature 98.2 F (36.8 C), temperature source Oral,  resp. rate 18, height 6' (1.829 m), weight 89.812 kg (198 lb), SpO2 96 %. Body mass index is 26.85 kg/(m^2). Lab Results:   No results found for this or any previous visit (from the past 72 hour(s)).  Physical Findings: AIMS: Facial and Oral Movements Muscles of Facial Expression: None, normal Lips and Perioral Area: None, normal Jaw: None, normal Tongue: None, normal,Extremity Movements Upper (arms, wrists, hands, fingers): None, normal Lower (legs, knees, ankles, toes): None, normal, Trunk Movements Neck, shoulders, hips: None, normal, Overall Severity Severity of abnormal movements (highest score from questions above): None, normal Incapacitation due to abnormal movements: None, normal Patient's awareness of abnormal  movements (rate only patient's report): No Awareness, Dental Status Current problems with teeth and/or dentures?: No Does patient usually wear dentures?: No  CIWA:  CIWA-Ar Total: 1 COWS:  COWS Total Score: 2  See Psychiatric Specialty Exam and Suicide Risk Assessment completed by Attending Physician prior to discharge.  Discharge destination:  Home  Is patient on multiple antipsychotic therapies at discharge:  No   Has Patient had three or more failed trials of antipsychotic monotherapy by history:  No  Recommended Plan for Multiple Antipsychotic Therapies: NA    Medication List    STOP taking these medications        carbamazepine 200 MG tablet  Commonly known as:  TEGRETOL     oxyCODONE 5 MG immediate release tablet  Commonly known as:  Oxy IR/ROXICODONE      TAKE these medications      Indication   docusate sodium 100 MG capsule  Commonly known as:  COLACE  Take 1 capsule (100 mg total) by mouth 2 (two) times daily. For constipation   Indication:  Constipation     feeding supplement (ENSURE COMPLETE) Liqd  Take 237 mLs by mouth 2 (two) times daily between meals. For nutritional supplememnt      folic acid 1 MG tablet  Commonly known as:  FOLVITE  Take 1 tablet (1 mg total) by mouth daily. For low folate   Indication:  Deficiency of Folic Acid in the Diet     gabapentin 300 MG capsule  Commonly known as:  NEURONTIN  Take 1 capsule (300 mg total) by mouth at bedtime. For agitation   Indication:  Agitation     lithium carbonate 300 MG capsule  Take 1 capsule (300 mg total) by mouth 2 (two) times daily with a meal. For mood stabilization   Indication:  Mood stabilization     LORazepam 1 MG tablet  Commonly known as:  ATIVAN  - Take 1 tablet four times daily x 2 days: #8 tablets.  - Take 1 tablet three times daily x 2 days: #6 tablets.  - Take 1 tablet two times daily x 2 days #4: tablets.  - Take 1 tablet daily x 2 days: #2 tablets. (Tapering dose format)    Indication:  Feeling Anxious, Ativan taper     multivitamin with minerals Tabs tablet  Take 1 tablet by mouth daily. For low vitamin      naproxen sodium 220 MG tablet  Commonly known as:  ANAPROX  Take 1 tablet (220 mg total) by mouth daily as needed (pain).   Indication:  Mild to Moderate Pain     nicotine 21 mg/24hr patch  Commonly known as:  NICODERM CQ - dosed in mg/24 hours  Place 1 patch (21 mg total) onto the skin daily. For nicotine addiction   Indication:  Nicotine Addiction  QUEtiapine 300 MG tablet  Commonly known as:  SEROQUEL  Take 1 tablet (300 mg total) by mouth at bedtime. For mood control   Indication:  schizoaffective disorder     sertraline 50 MG tablet  Commonly known as:  ZOLOFT  Take 1 tablet (50 mg total) by mouth daily. For depression   Indication:  Major Depressive Disorder     thiamine 100 MG tablet  Take 1 tablet (100 mg total) by mouth daily. For low thiamine   Indication:  Deficiency in Thiamine or Vitamin B1     traMADol 50 MG tablet  Commonly known as:  ULTRAM  Take 1 tablet (50 mg total) by mouth every 6 (six) hours as needed for moderate pain.   Indication:  Moderate to Moderately Severe Pain     zolpidem 10 MG tablet  Commonly known as:  AMBIEN  Take 1 tablet (10 mg total) by mouth at bedtime as needed for sleep.   Indication:  Trouble Sleeping       Follow-up Information    Follow up with [facility].   Why:  Walk in between 8am-9am Monday through Friday for hospital follow-up/medication management/assessment for therapy services.    Contact information:   [address], [place] [address] Phone: [phone] Fax: [phone]      Follow up with [facility] On 10/31/2014.   Why:  Appointment on Tuesday March 15th at 2 pm. Please call office if you need to reschedule and bring your ID, insurance card (if you have one), and hospital discharge paperwork with you.   Contact information:   [address] [place], [place] [address] Phone: [phone] Fax: [phone]     Follow-up recommendations: Activity:  As tolerated Diet: As recommended by your primary care doctor. Keep all scheduled follow-up appointments as recommended.   Comments:  Take all your medications as prescribed by your mental healthcare provider. Report any adverse effects and or reactions from your medicines to your outpatient provider promptly. Patient is instructed and cautioned to not engage in alcohol and or illegal drug use while on prescription medicines. In the event of worsening symptoms, patient is instructed to call the crisis hotline, 911 and or go to the nearest ED for appropriate evaluation and treatment of symptoms. Follow-up with your primary care provider for your other medical issues, concerns and or health care needs.   Total Discharge Time:  Greater than 30 minutes  Signed: Encarnacion Slates, PMHNP, FNP-BC 10/30/2014, 1:43 PM  I personally assessed the patient and formulated the plan Geralyn Flash A. Sabra Heck, M.D.

## 2014-10-30 NOTE — Progress Notes (Signed)
D:  Patient's self inventory sheet, patient slept good last night, sleep medication is helpful.  Good appetite, normal energy level, good concentration.  Rated depression 3, hopeless 1, anxiety 8.  Denied withdrawals.  Denied SI.   Has experienced pain and dizziness.  Pain in back, tail bone, legs, #8 worst pain.  Pain medication is helpful.  Goal is to go home and do what he needs to do.  Plans to have prescriptions filled.  Does have discharge plans.  No problems anticipated after discharge. A:  Medications administered per MD orders.  Emotional support and encouragement given patient. R:  Denied SI and HI, contracts for safety.  Denied A/V hallucinations.  Safety maintained with 15 minute checks.

## 2014-10-30 NOTE — Progress Notes (Signed)
D: Patient excited about leaving tomorrow. Patient stated "I'm getting discharged tomorrow; Awesome. I have a lot of stuff to take care of. I am very ready and excited". Patient denied SI, AH/VH. Complained mild pain @ left ankle. Seen mildly swollen and pinkish. Warm to touch. No pitting edema noted. Refused pain med. Stated "it's okay. I can handle that". Meanwhile, patient is on tramadol 50 mg every 6 hours.  A: Due medications given as ordered. Support and encouragement offered. Every 15 minutes check for safety maintained. Will continue to monitor patient.  R: Patient continues to be appropriate.

## 2014-10-30 NOTE — BHH Suicide Risk Assessment (Signed)
[facility] Discharge Suicide Risk Assessment   Demographic Factors:  Male and Caucasian  Total Time spent with patient: 30 minutes  Musculoskeletal: Strength & Muscle Tone: within normal limits Gait & Station: normal Patient leans: N/A  Psychiatric Specialty Exam: Physical Exam  Review of Systems  Constitutional: Negative.   HENT: Negative.   Eyes: Negative.   Respiratory: Negative.   Cardiovascular: Negative.   Gastrointestinal: Negative.   Genitourinary: Negative.   Musculoskeletal: Positive for back pain.  Skin: Negative.   Neurological: Negative.   Endo/Heme/Allergies: Negative.   Psychiatric/Behavioral: Positive for substance abuse.  Hallucinations  Blood pressure 116/67, pulse 93, temperature 98.2 F (36.8 C), temperature source Oral, resp. rate 18, height 6' (1.829 m), weight 89.812 kg (198 lb), SpO2 96 %.Body mass index is 26.85 kg/(m^2).  General Appearance: Fairly Groomed  Engineer, water::  Fair  Speech:  Clear and [id number]  Volume:  Decreased  Mood:  Anxious  Affect:  Appropriate  Thought Process:  Coherent and Goal Directed  Orientation:  Full (Time, Place, and Person)  Thought Content:  plans as he moves on, relapse prevention plan  Suicidal Thoughts:  No  Homicidal Thoughts:  No  Memory:  Immediate;   Fair Recent;   Fair Remote;   Fair  Judgement:  Fair  Insight:  Present  Psychomotor Activity:  Restlessness  Concentration:  Fair  Recall:  [employer] [place]  Language: Fair  Akathisia:  No  Handed:  Right  AIMS (if indicated):     Assets:  Desire for Improvement Housing Social Support  Sleep:  Number of Hours: 2.75  Cognition: WNL  ADL's:  Intact   Have you used any form of tobacco in the last 30 days? (Cigarettes, Smokeless Tobacco, Cigars, and/or Pipes): Yes  Has this patient used any form of tobacco in the last 30 days? (Cigarettes, Smokeless Tobacco, Cigars, and/or Pipes) Yes, A prescription for an FDA-approved tobacco cessation  medication was offered at discharge and the patient refused  Mental Status Per Nursing Assessment::   On Admission:  NA  Current Mental Status by Physician: IN full contact with reality. There are no active S/S of withdrawal. There are no active SI plans or intent. He states he is committed to sobriety. His mother is going to stay with him. This is going to help him have a sense of purpose as he will have to be there for her. The voices are in the background they are not threatening and he states they don't bother him anymore. He is going to actively start working on his grieve   Loss Factors: NA  Historical Factors: Anniversary of important loss  Risk Reduction Factors:   Sense of responsibility to family, Living with another person, especially a relative and Positive social support  Continued Clinical Symptoms:  Depression:   Comorbid alcohol abuse/dependence Insomnia Alcohol/Substance Abuse/Dependencies  Cognitive Features That Contribute To Risk:  Closed-mindedness, Polarized thinking and Thought constriction (tunnel vision)    Suicide Risk:  Minimal: No identifiable suicidal ideation.  Patients presenting with no risk factors but with morbid ruminations; may be classified as minimal risk based on the severity of the depressive symptoms  Principal Problem: Schizophrenia, schizoaffective, chronic with acute exacerbation Discharge Diagnoses:  Patient Active Problem List   Diagnosis Date Noted  . Schizoaffective disorder [F25.9] 12/29/2013    Priority: High  . Alcohol dependence [F10.20] 11/03/2011    Priority: High  . Schizophrenia, schizoaffective, chronic with acute exacerbation [F25.8] 10/24/2014  . Alcohol intoxication [F10.129]   .  Chronic posttraumatic stress disorder [F43.12]   . Protein-calorie malnutrition, severe [E43] 10/19/2014  . Hyponatremia [E87.1] 10/18/2014  . Elevated LFTs [R79.89]   . Alcohol abuse with physiological dependence [F10.20]   .  Hallucinations, visual [R44.1]   . Noncompliance with medication regimen [Z91.14]   . Hallucinations [R44.3] 07/07/2014  . Paranoia [F22] 07/07/2014  . Abnormal liver enzymes [R74.8] 02/14/2014  . Cannabis abuse [F12.10] 12/30/2013  . Post traumatic stress disorder (PTSD) [F43.10] 11/03/2011  . Active smoker [Z72.0] 06/30/2011    Follow-up Information    Follow up with [facility].   Why:  Walk in between 8am-9am Monday through Friday for hospital follow-up/medication management/assessment for therapy services.    Contact information:   [address], [place] [address] Phone: [phone] Fax: [phone]      Follow up with [facility] On 10/31/2014.   Why:  Appointment on Tuesday March 15th at 2 pm. Please call office if you need to reschedule and bring your ID, insurance card (if you have one), and hospital discharge paperwork with you.   Contact information:   [address] [place], [place] [address] Phone: [phone] Fax: [phone]      Plan Of Care/Follow-up recommendations:  Activity:  as tolerated Diet:  heart healthy Follow up [facility] Is patient on multiple antipsychotic therapies at discharge:  No   Has Patient had three or more failed trials of antipsychotic monotherapy by history:  No  Recommended Plan for Multiple Antipsychotic Therapies: NA    Ajahni Nay A 10/30/2014, 2:21 PM

## 2014-10-30 NOTE — Progress Notes (Signed)
Recreation Therapy Notes  Date: 03.14.2016 Time: 9:30am Location: [address] Group Room   Group Topic: Stress Management  Goal Area(s) Addresses:  Patient will actively participate in stress management techniques presented during session.   Behavioral Response: Engaged, Attentive  Intervention: Stress management techniques  Activity :  Guided Imagery, Facial Massage. Patients were asked to visualize a sunrise at the beach while listening to a soundtrack of the ocean. Patients were then asked to participate in neck and facial massage used to help release tension.   Education:  Stress Management, Discharge Planning.   Education Outcome: Acknowledges edcuation  Clinical Observations/Feedback: Patient actively engaged in both techniques, expressed no issues and displayed ability to practice independently post d/c.   Laureen Ochs Rielly Brunn, LRT/CTRS  Quantrell Splitt L 10/30/2014 2:16 PM

## 2014-10-30 NOTE — BHH Group Notes (Signed)
[facility] LCSW Aftercare Discharge Planning Group Note   10/30/2014 11:06 AM  Participation Quality:  Appropriate   Mood/Affect:  Appropriate  Depression Rating:  2  Anxiety Rating:  8 "this is normal for me. I can manage." Pt pleasant and calm during group.   Thoughts of Suicide:  No Will you contract for safety?   NA  Current AVH:  No  Plan for Discharge/Comments:  Pt reports that he is d/cing today and is looking forward to when his mother comes from [place] to see him "in a few days." pt plans to follow-up at [facility] and has appt scheduled at [facility] for medical follow-up. Pt hoping for rolling walker--CM notified of PT consult.   Transportation Means: bus or sibling  Supports: siblings, mother, some friends   Proofreader, Research officer, trade union

## 2014-10-30 NOTE — Progress Notes (Signed)
Patient ID: [name], male   DOB: October 30, 1970, 44 y.o.   MRN: [id number] PER STATE REGULATIONS 482.30  THIS CHART WAS REVIEWED FOR MEDICAL NECESSITY WITH RESPECT TO THE PATIENT'S ADMISSION/ DURATION OF STAY.  NEXT REVIEW DATE: 11/01/2014  Chauncy Lean, RN, BSN CASE MANAGER

## 2014-10-30 NOTE — Progress Notes (Signed)
  [facility] Adult Case Management Discharge Plan :  Will you be returning to the same living situation after discharge:  Yes,  home At discharge, do you have transportation home?: Yes,  bus pass in chart. Pt trying to get sibling(s) to pick him up after lunch Do you have the ability to pay for your medications: Yes,  Medicare  Release of information consent forms completed and submitted to Medical Records by CSW.  Patient to Follow up at: Follow-up Information    Follow up with [facility].   Why:  Walk in between 8am-9am Monday through Friday for hospital follow-up/medication management/assessment for therapy services.    Contact information:   [address], [place] [address] Phone: [phone] Fax: [phone]      Follow up with [facility] On 10/31/2014.   Why:  Appointment on Tuesday March 15th at 2 pm. Please call office if you need to reschedule and bring your ID, insurance card (if you have one), and hospital discharge paperwork with you.   Contact information:   [address] [place], [place] [address] Phone: [phone] Fax: [phone]      Patient denies SI/HI: Yes,  during group/self reprot.     Safety Planning and Suicide Prevention discussed: Yes,  SPE completed with pt's mother. Pt provided with SPI pamphlet and encouraged to share information with support network, ask questions, and talk about any concerns relating to SPE.  Have you used any form of tobacco in the last 30 days? (Cigarettes, Smokeless Tobacco, Cigars, and/or Pipes): Yes  Has patient been referred to the Quitline?: Patient refused referral  Smart, Alicia Amel  10/30/2014, 11:10 AM

## 2014-10-30 NOTE — Progress Notes (Signed)
Physical Therapy Treatment Patient Details Name: [name] MRN: [id number] DOB: Jul 18, 1971 Today's Date: 11-12-2014    History of Present Illness      PT Comments    Pt stated he had been doing his exercises I had given him daily and could see an improvement. I noted pt looked stronger and with less tremors during regular gait, however after about 10 minutes on his feet and with exercises his tremors and weakness became visible.   Reviewed exercises and he is fine with all of them. I added fee together stance which really challenged him , so he will perform for a few seconds at [facility] support in his exercise program. Asked pt if he wanted HHPT to follow him that he would benefit, however he stated he felt he would progress himself with these exercises fine.    Follow Up Recommendations  No PT follow up     Equipment Recommendations  Rolling walker with 5" wheels    Recommendations for Other Services       Precautions / Restrictions      Mobility  Bed Mobility                  Transfers                    Ambulation/Gait Ambulation/Gait assistance: Supervision Ambulation Distance (Feet): 200 Feet Assistive device: None Gait Pattern/deviations: Wide base of support;Step-through pattern     General Gait Details: pt showed some progression with gait without the RW , however still with 200 feet he didn't have to hold onto me, however visible weakness noted at this distance. For this reason recommend pt still DC with RW for longer dstances until he regains his trentgh for safety. Communicated this with pt's nurse for need for RW.    Stairs            Wheelchair Mobility    Modified Rankin (Stroke Patients Only)       Balance                                    Cognition                            Exercises      General Comments        Pertinent Vitals/Pain      Home Living                       Prior Function            PT Goals (current goals can now be found in the care plan section) Progress towards PT goals: Progressing toward goals (pt states he is DC ing today)    Frequency       PT Plan      Co-evaluation             End of Session   Activity Tolerance: Patient tolerated treatment well Patient left:  (in hallway)     Time: 1355-1409 PT Time Calculation (min) (ACUTE ONLY): 14 min  Charges:  $Therapeutic Exercise: 8-22 mins                    G CodesClide Dales Nov 12, 2014, 3:30 PM  Clide Dales, PT Pager: [phone] Nov 12, 2014

## 2014-10-31 ENCOUNTER — Inpatient Hospital Stay: Payer: Self-pay

## 2014-11-01 NOTE — Progress Notes (Signed)
Patient Discharge Instructions:  After Visit Summary (AVS):   Faxed to:  11/01/14 Discharge Summary Note:   Faxed to:  11/01/14 Psychiatric Admission Assessment Note:   Faxed to:  11/01/14 Suicide Risk Assessment - Discharge Assessment:   Faxed to:  11/01/14 Faxed/Sent to the Next Level Care provider:  11/01/14 Next Level Care Provider Has Access to the EMR, 11/01/14  Faxed to [facility] @ [phone] Records provided to [place] via CH[facility]/Epic access.  Patsey Berthold, 11/01/2014, 3:37 PM

## 2014-11-02 ENCOUNTER — Ambulatory Visit (HOSPITAL_BASED_OUTPATIENT_CLINIC_OR_DEPARTMENT_OTHER): Payer: Medicare Other | Admitting: Family Medicine

## 2014-11-02 VITALS — BP 116/73 | HR 109 | Temp 99.3°F | Resp 16 | Ht 72.0 in | Wt 204.0 lb

## 2014-11-02 DIAGNOSIS — F1210 Cannabis abuse, uncomplicated: Secondary | ICD-10-CM | POA: Insufficient documentation

## 2014-11-02 DIAGNOSIS — R251 Tremor, unspecified: Secondary | ICD-10-CM | POA: Diagnosis not present

## 2014-11-02 DIAGNOSIS — F329 Major depressive disorder, single episode, unspecified: Secondary | ICD-10-CM | POA: Diagnosis not present

## 2014-11-02 DIAGNOSIS — S0990XA Unspecified injury of head, initial encounter: Secondary | ICD-10-CM | POA: Diagnosis not present

## 2014-11-02 DIAGNOSIS — S199XXA Unspecified injury of neck, initial encounter: Secondary | ICD-10-CM | POA: Diagnosis not present

## 2014-11-02 DIAGNOSIS — F259 Schizoaffective disorder, unspecified: Secondary | ICD-10-CM | POA: Diagnosis not present

## 2014-11-02 DIAGNOSIS — S3992XA Unspecified injury of lower back, initial encounter: Secondary | ICD-10-CM | POA: Diagnosis not present

## 2014-11-02 DIAGNOSIS — F209 Schizophrenia, unspecified: Secondary | ICD-10-CM | POA: Diagnosis not present

## 2014-11-02 DIAGNOSIS — Z79899 Other long term (current) drug therapy: Secondary | ICD-10-CM | POA: Diagnosis not present

## 2014-11-02 DIAGNOSIS — S59911A Unspecified injury of right forearm, initial encounter: Secondary | ICD-10-CM | POA: Diagnosis not present

## 2014-11-02 DIAGNOSIS — G40909 Epilepsy, unspecified, not intractable, without status epilepticus: Secondary | ICD-10-CM | POA: Diagnosis not present

## 2014-11-02 DIAGNOSIS — M549 Dorsalgia, unspecified: Secondary | ICD-10-CM | POA: Diagnosis not present

## 2014-11-02 DIAGNOSIS — F17210 Nicotine dependence, cigarettes, uncomplicated: Secondary | ICD-10-CM | POA: Diagnosis not present

## 2014-11-02 DIAGNOSIS — G9340 Encephalopathy, unspecified: Secondary | ICD-10-CM | POA: Diagnosis present

## 2014-11-02 DIAGNOSIS — W1830XA Fall on same level, unspecified, initial encounter: Secondary | ICD-10-CM | POA: Diagnosis not present

## 2014-11-02 DIAGNOSIS — F10231 Alcohol dependence with withdrawal delirium: Secondary | ICD-10-CM | POA: Diagnosis not present

## 2014-11-02 DIAGNOSIS — Z888 Allergy status to other drugs, medicaments and biological substances status: Secondary | ICD-10-CM | POA: Diagnosis not present

## 2014-11-02 DIAGNOSIS — F419 Anxiety disorder, unspecified: Secondary | ICD-10-CM | POA: Diagnosis not present

## 2014-11-02 DIAGNOSIS — F4310 Post-traumatic stress disorder, unspecified: Secondary | ICD-10-CM | POA: Diagnosis not present

## 2014-11-02 DIAGNOSIS — M545 Low back pain: Secondary | ICD-10-CM | POA: Diagnosis not present

## 2014-11-02 DIAGNOSIS — F10239 Alcohol dependence with withdrawal, unspecified: Secondary | ICD-10-CM | POA: Insufficient documentation

## 2014-11-02 DIAGNOSIS — M79631 Pain in right forearm: Secondary | ICD-10-CM | POA: Diagnosis not present

## 2014-11-02 DIAGNOSIS — Y929 Unspecified place or not applicable: Secondary | ICD-10-CM | POA: Diagnosis not present

## 2014-11-02 NOTE — Patient Instructions (Signed)
1. [facility]. 2. Make an appointment here for one month with assigned PCP

## 2014-11-02 NOTE — Progress Notes (Signed)
Patient ID: [name], male   DOB: June 26, 1971, 44 y.o.   MRN: [id number] [facility] Complaint:  Hospital follow-up to establish care.  Subjective:  Patient presents for a hospital follow-up at [facility] for Schizo-affective disorder.  She is to follow-up with [facility] for on-going care. He denies other chronic illnesses and is on no other medications. He has a history of polysubstance abuse including alcohol and marijuanna. He is an active smoker and suffers with PTSD. He has a history of elevated LFTS, hyponatremia and malnutrition.    ROS:  GEN:   Denies fever, chills,WT loss Skin:   Denies lesions or rashes HENT:   Denies  earache, epistaxis, sore throat, or neck pain, or headaches                LUNGS:  Denies SOB with rest or walking CV:   Denies CP or palpitations ABD:   Denies abdominal pain, nausea,and vomiting            EXT:    Denies muscle spasms or swelling; no pain in lower ext, no weakness NEURO:   Denies numbness or tingling, denies sz, Hx of stroke.  Objective:  Filed Vitals:   11/02/14 1445  BP: 116/73  Pulse: 109  Temp: 99.3 F (37.4 C)  Resp: 16  Height: 6' (1.829 m)  Weight: 204 lb (92.534 kg)  SpO2: 94%    Physical Exam: General:  in no acute distress. HEENT:  no pallor, no icterus, moist oral mucosa, no  lymphadenopathy Heart:   Normal  s1 &s2  Regular rate and rhythm, without M,G,R Lungs:   Clear to auscultation bilaterally. Abdomen:  Soft, nontender, nondistended, positive bowel sounds. Exetremeties:  No pedal edema,pedal pulses normal. Neuro:   Alert, awake, oriented x3, nonfocal.  Walking with a walker due to a fall injury about 10 days ago.      Medications: Prior to Admission medications   Medication Sig Start Date End Date Taking? Authorizing Provider  gabapentin (NEURONTIN) 300 MG capsule Take 1 capsule (300 mg total) by mouth at bedtime. For agitation 10/30/14  Yes Encarnacion Slates, NP  lithium carbonate 300 MG capsule Take 1  capsule (300 mg total) by mouth 2 (two) times daily with a meal. For mood stabilization 10/30/14  Yes Encarnacion Slates, NP  LORazepam (ATIVAN) 1 MG tablet Take 1 tablet four times daily x 2 days: #8 tablets. Take 1 tablet three times daily x 2 days: #6 tablets. Take 1 tablet two times daily x 2 days #4: tablets. Take 1 tablet daily x 2 days: #2 tablets. (Tapering dose format) 10/30/14  Yes Encarnacion Slates, NP  naproxen sodium (ANAPROX) 220 MG tablet Take 1 tablet (220 mg total) by mouth daily as needed (pain). 10/30/14  Yes Encarnacion Slates, NP  QUEtiapine (SEROQUEL) 300 MG tablet Take 1 tablet (300 mg total) by mouth at bedtime. For mood control 10/30/14  Yes Encarnacion Slates, NP  sertraline (ZOLOFT) 50 MG tablet Take 1 tablet (50 mg total) by mouth daily. For depression 10/30/14  Yes Encarnacion Slates, NP  traMADol (ULTRAM) 50 MG tablet Take 1 tablet (50 mg total) by mouth every 6 (six) hours as needed for moderate pain. 10/30/14  Yes Encarnacion Slates, NP  zolpidem (AMBIEN) 10 MG tablet Take 1 tablet (10 mg total) by mouth at bedtime as needed for sleep. 10/30/14 11/29/14 Yes Encarnacion Slates, NP  docusate sodium (COLACE) 100 MG capsule Take 1 capsule (100 mg total)  by mouth 2 (two) times daily. For constipation Patient not taking: Reported on 11/02/2014 10/30/14   Encarnacion Slates, NP  feeding supplement, ENSURE COMPLETE, (ENSURE COMPLETE) LIQD Take 237 mLs by mouth 2 (two) times daily between meals. For nutritional supplememnt Patient not taking: Reported on 11/02/2014 10/30/14   Encarnacion Slates, NP  folic acid (FOLVITE) 1 MG tablet Take 1 tablet (1 mg total) by mouth daily. For low folate Patient not taking: Reported on 11/02/2014 10/30/14   Encarnacion Slates, NP  Multiple Vitamin (MULTIVITAMIN WITH MINERALS) TABS tablet Take 1 tablet by mouth daily. For low vitamin Patient not taking: Reported on 11/02/2014 10/30/14   Encarnacion Slates, NP  nicotine (NICODERM CQ - DOSED IN MG/24 HOURS) 21 mg/24hr patch Place 1 patch (21 mg total) onto the  skin daily. For nicotine addiction Patient not taking: Reported on 11/02/2014 10/30/14   Encarnacion Slates, NP  thiamine 100 MG tablet Take 1 tablet (100 mg total) by mouth daily. For low thiamine Patient not taking: Reported on 11/02/2014 10/30/14   Encarnacion Slates, NP  zolpidem (AMBIEN) 10 MG tablet Take 1 tablet (10 mg total) by mouth at bedtime as needed for sleep. 07/16/11 08/15/11  Cherene Altes, MD     Recent Labs:  Results for CHANCY, SMIGIEL (MRN [id number]) as of 11/02/2014 15:12  Ref. Range 10/21/2014 05:34 10/22/2014 04:25 10/23/2014 05:43 10/24/2014 06:10 10/25/2014 06:38  Sodium Latest Range: 135-145 mmol/L 131 (L) 134 (L) 134 (L) 136   Potassium Latest Range: 3.5-5.1 mmol/L 3.9 4.3 4.1 4.5   Chloride Latest Range: 96-112 mmol/L 99 104 101 102   CO2 Latest Range: 19-32 mmol/L 25 22 22 24    BUN Latest Range: 6-23 mg/dL 8 12 21 17    Creatinine Latest Range: 0.50-1.35 mg/dL 0.63 0.70 0.76 0.73   Calcium Latest Range: 8.4-10.5 mg/dL 8.8 9.2 9.3 9.5   GFR calc non Af Amer Latest Range: >90 mL/min >90 >90 >90 >90   GFR calc Af Amer Latest Range: >90 mL/min >90 >90 >90 >90   Glucose Latest Range: 70-99 mg/dL 99 107 (H) 101 (H) 93   Anion gap Latest Range: 5-15  7 8 11 10    Magnesium Latest Range: 1.5-2.5 mg/dL 1.8      Alkaline Phosphatase Latest Range: 39-117 U/L 111 102 108 109   Albumin Latest Range: 3.5-5.2 g/dL 3.7 3.8 3.7 3.8   AST Latest Range: 0-37 U/L 424 (H) 267 (H) 216 (H) 197 (H)   ALT Latest Range: 0-53 U/L 319 (H) 289 (H) 276 (H) 283 (H)   Total Protein Latest Range: 6.0-8.3 g/dL 6.3 6.3 6.2 6.5   Total Bilirubin Latest Range: 0.3-1.2 mg/dL 1.0 0.7 0.4 0.5   WBC Latest Range: 4.0-10.5 K/uL 3.4 (L) 4.0 4.1 4.9   RBC Latest Range: 4.22-5.81 MIL/uL 3.33 (L) 3.51 (L) 3.36 (L) 3.30 (L)   Hemoglobin Latest Range: 13.0-17.0 g/dL 11.0 (L) 11.3 (L) 11.0 (L) 10.8 (L)   HCT Latest Range: 39.0-52.0 % 31.8 (L) 33.7 (L) 32.8 (L) 32.4 (L)   MCV Latest Range: 78.0-100.0 fL 95.5 96.0 97.6 98.2    MCH Latest Range: 26.0-34.0 pg 33.0 32.2 32.7 32.7   MCHC Latest Range: 30.0-36.0 g/dL 34.6 33.5 33.5 33.3   RDW Latest Range: 11.5-15.5 % 14.6 14.9 15.2 15.7 (H)   Platelets Latest Range: 150-400 K/uL 92 (L) 123 (L) 151 202   Carbamazepine, Free Latest Range: 1.0-3.0 mcg/mL    0.6 (L)   Carbamazepine Lvl Latest Range:  4.0-12.0 ug/mL     3.3 (L)  Gabapentin Lvl Latest Units: mcg/mL     3.4  Lithium Lvl Latest Range: 0.80-1.40 mmol/L     0.21 (L)  Prolactin Latest Range: 4.0-15.2 ng/mL     13.2  TSH Latest Range: 0.350-4.500 uIU/mL     1.802  Carbamazepine Metabolite Latest Range: 0.1-1.0 mcg/mL    0.5   Carbamazepine Metabolite - Latest Range: 0.2-2.0 mcg/mL    1.1   Carbamazepine Metabolite/ Latest Range:  mcg/mL    0.6   Carbamazepine, Bound Latest Range:  mcg/mL    2.6   Carbamazepine, Total Latest Range: 4.0-12.0 mcg/mL    3.2 (L)      Assessment: 1. Schizoaffect Disorder 2. Poly substance abuse. 3.   Plan:  Follow-up at [facility] as planned for S[place] and Alcohol Abuse. Follow-up here with assigned PCP for routine health care and prn as needed  Follow up:  The patient was given clear instructions to go to ER or return to medical center if symptoms don't improve, worsen or new problems develop. The patient verbalized understanding. The patient was told to call to get lab results if they haven't heard anything in the next week.   This note has been created with Surveyor, quantity. Any transcriptional errors are unintentional.   Micheline Chapman, FNP,BC 11/02/2014, 2:52 PM

## 2014-11-02 NOTE — Progress Notes (Signed)
Patient discharge summary from [facility] is below.  He reports he is taking all his medications prescribed by [facility].  He is still not drinking ETOH and uses marijuana occasionally.  He has not followed up with [facility] or attended any AA/NA meetings but says he knows where they are held.  He says he wants to get involved with the [facility] and their Schizophrenia support group.  [name], 44 y.o. male patient admitted with alcohol intoxication and having suicidal thoughts on the medical floor at [facility]. He was having AVH, suicidal thoughts with a plan to kill self. BAC was 362 with elevated liver enzymes. He states that his depression had been worsening in the past few months and he started having suicidal thoughts an images. Reports that his meds were not working very well.  [name] was admitted to the hospital with his BAL at 362 per toxicology test results & UDS positive for THC. He was intoxicated with reports of auditory/visual hallucination. He was here for alcohol/drug detox. His detoxification treatment was achieved using Ativan detox regimen on a tapering dose format. This was used in place of Librium detox protocol because Librium is a long acting benzodiazepine with a long half life. If Librium was used for this detox treatment will leave a lingering adverse effects in his system. By using ativan detox regimen, [name] received a cleaner detoxification treatment without the lingering adverse effects of the Librium capsules. He was also enrolled in the group counseling sessions, AA/NA meetings being offered and held on this unit. He participated and learned coping skills. [name] received other medication regimen for his other medical conditions presented. He tolerated his treatment regimen without any significant adverse effects and or reactions.  Besides the treatments received here and scheduled outpatient psychiatric services, [name] also was medicated and  discharged on Ambien 10 mg at bedtime for insomnia, Gabapentin 300 Q bedtime for agitation, Seroquel 300 mg Q bedtime for mood control, Sertraline 50 mg for depression,, Nicotine patch Q 24 hours for nicotine dependence, Lithium carbonate 300 mg for mood stabilization, folic acid 1 mg for low folate & thiamine 100 mg for low thiamine. He was provided with a prescription on ativan 1 mg tapering dose format to complete at home. [name] has completed detox treatment and his mood is stable. This is evidenced by his reports of improved mood and absence substance withdrawal symptoms. He will resume psychiatric care and routine medication management at the [facility] clinic here in [place], [place]. Patient was encouraged to join/attend AA/NA meetings being offered and held within his community.   Upon discharge, [name] adamantly denies any suicidal, homicidal ideations, auditory, visual hallucinations, delusional thoughts, paranoia and or withdrawal symptoms. He left [facility] with all personal belongings in no apparent distress. He received 4 days worth supply samples of his [facility] discharge medications provided by [facility] pharmacy. Transportation per city bus. Bus pass provided by [facility].

## 2014-11-03 ENCOUNTER — Emergency Department (HOSPITAL_COMMUNITY): Payer: Medicare Other

## 2014-11-03 ENCOUNTER — Observation Stay (HOSPITAL_COMMUNITY)
Admission: EM | Admit: 2014-11-03 | Discharge: 2014-11-06 | Disposition: A | Discharge status: Home / Self Care | Payer: Medicare Other | Attending: Family Medicine | Admitting: Family Medicine

## 2014-11-03 ENCOUNTER — Observation Stay (HOSPITAL_COMMUNITY): Payer: Medicare Other

## 2014-11-03 ENCOUNTER — Encounter (HOSPITAL_COMMUNITY): Payer: Self-pay | Admitting: Emergency Medicine

## 2014-11-03 DIAGNOSIS — G40909 Epilepsy, unspecified, not intractable, without status epilepticus: Secondary | ICD-10-CM | POA: Insufficient documentation

## 2014-11-03 DIAGNOSIS — Z79899 Other long term (current) drug therapy: Secondary | ICD-10-CM | POA: Insufficient documentation

## 2014-11-03 DIAGNOSIS — R251 Tremor, unspecified: Secondary | ICD-10-CM | POA: Insufficient documentation

## 2014-11-03 DIAGNOSIS — S59911A Unspecified injury of right forearm, initial encounter: Secondary | ICD-10-CM | POA: Diagnosis not present

## 2014-11-03 DIAGNOSIS — F17210 Nicotine dependence, cigarettes, uncomplicated: Secondary | ICD-10-CM | POA: Insufficient documentation

## 2014-11-03 DIAGNOSIS — R404 Transient alteration of awareness: Secondary | ICD-10-CM | POA: Diagnosis not present

## 2014-11-03 DIAGNOSIS — M545 Low back pain: Secondary | ICD-10-CM | POA: Insufficient documentation

## 2014-11-03 DIAGNOSIS — F4310 Post-traumatic stress disorder, unspecified: Secondary | ICD-10-CM | POA: Insufficient documentation

## 2014-11-03 DIAGNOSIS — S199XXA Unspecified injury of neck, initial encounter: Secondary | ICD-10-CM | POA: Diagnosis not present

## 2014-11-03 DIAGNOSIS — R42 Dizziness and giddiness: Secondary | ICD-10-CM | POA: Diagnosis not present

## 2014-11-03 DIAGNOSIS — F329 Major depressive disorder, single episode, unspecified: Secondary | ICD-10-CM | POA: Insufficient documentation

## 2014-11-03 DIAGNOSIS — G252 Other specified forms of tremor: Secondary | ICD-10-CM

## 2014-11-03 DIAGNOSIS — Z888 Allergy status to other drugs, medicaments and biological substances status: Secondary | ICD-10-CM | POA: Insufficient documentation

## 2014-11-03 DIAGNOSIS — F10931 Alcohol use, unspecified with withdrawal delirium: Secondary | ICD-10-CM

## 2014-11-03 DIAGNOSIS — F1210 Cannabis abuse, uncomplicated: Secondary | ICD-10-CM | POA: Insufficient documentation

## 2014-11-03 DIAGNOSIS — S0990XA Unspecified injury of head, initial encounter: Secondary | ICD-10-CM | POA: Diagnosis not present

## 2014-11-03 DIAGNOSIS — W1830XA Fall on same level, unspecified, initial encounter: Secondary | ICD-10-CM | POA: Insufficient documentation

## 2014-11-03 DIAGNOSIS — R55 Syncope and collapse: Secondary | ICD-10-CM | POA: Diagnosis not present

## 2014-11-03 DIAGNOSIS — F209 Schizophrenia, unspecified: Secondary | ICD-10-CM | POA: Insufficient documentation

## 2014-11-03 DIAGNOSIS — G9340 Encephalopathy, unspecified: Secondary | ICD-10-CM

## 2014-11-03 DIAGNOSIS — F10231 Alcohol dependence with withdrawal delirium: Secondary | ICD-10-CM | POA: Diagnosis not present

## 2014-11-03 DIAGNOSIS — Y929 Unspecified place or not applicable: Secondary | ICD-10-CM | POA: Insufficient documentation

## 2014-11-03 DIAGNOSIS — F419 Anxiety disorder, unspecified: Secondary | ICD-10-CM | POA: Insufficient documentation

## 2014-11-03 DIAGNOSIS — M79631 Pain in right forearm: Secondary | ICD-10-CM | POA: Diagnosis not present

## 2014-11-03 DIAGNOSIS — T07 Unspecified multiple injuries: Secondary | ICD-10-CM | POA: Diagnosis not present

## 2014-11-03 DIAGNOSIS — M549 Dorsalgia, unspecified: Secondary | ICD-10-CM | POA: Diagnosis not present

## 2014-11-03 DIAGNOSIS — S3992XA Unspecified injury of lower back, initial encounter: Secondary | ICD-10-CM | POA: Diagnosis not present

## 2014-11-03 LAB — CBC WITH DIFFERENTIAL/PLATELET
Basophils Absolute: 0 10*3/uL (ref 0.0–0.1)
Basophils Relative: 0 % (ref 0–1)
Eosinophils Absolute: 0 10*3/uL (ref 0.0–0.7)
Eosinophils Relative: 0 % (ref 0–5)
HCT: 32.4 % — ABNORMAL LOW (ref 39.0–52.0)
Hemoglobin: 10.5 g/dL — ABNORMAL LOW (ref 13.0–17.0)
Lymphocytes Relative: 10 % — ABNORMAL LOW (ref 12–46)
Lymphs Abs: 1.3 10*3/uL (ref 0.7–4.0)
MCH: 32.3 pg (ref 26.0–34.0)
MCHC: 32.4 g/dL (ref 30.0–36.0)
MCV: 99.7 fL (ref 78.0–100.0)
Monocytes Absolute: 1.2 10*3/uL — ABNORMAL HIGH (ref 0.1–1.0)
Monocytes Relative: 9 % (ref 3–12)
Neutro Abs: 10.1 10*3/uL — ABNORMAL HIGH (ref 1.7–7.7)
Neutrophils Relative %: 81 % — ABNORMAL HIGH (ref 43–77)
Platelets: 649 10*3/uL — ABNORMAL HIGH (ref 150–400)
RBC: 3.25 MIL/uL — ABNORMAL LOW (ref 4.22–5.81)
RDW: 15 % (ref 11.5–15.5)
WBC: 12.6 10*3/uL — ABNORMAL HIGH (ref 4.0–10.5)

## 2014-11-03 LAB — RAPID HIV SCREEN (HIV 1/2 AB+AG)
HIV 1/2 Antibodies: NONREACTIVE
HIV-1 P24 Antigen - HIV24: NONREACTIVE

## 2014-11-03 LAB — COMPREHENSIVE METABOLIC PANEL
ALT: 120 U/L — ABNORMAL HIGH (ref 0–53)
AST: 97 U/L — ABNORMAL HIGH (ref 0–37)
Albumin: 3.8 g/dL (ref 3.5–5.2)
Alkaline Phosphatase: 85 U/L (ref 39–117)
Anion gap: 10 (ref 5–15)
BUN: 10 mg/dL (ref 6–23)
CO2: 23 mmol/L (ref 19–32)
Calcium: 9.3 mg/dL (ref 8.4–10.5)
Chloride: 112 mmol/L (ref 96–112)
Creatinine, Ser: 0.89 mg/dL (ref 0.50–1.35)
GFR calc Af Amer: >90 mL/min (ref >90)
GFR calc non Af Amer: >90 mL/min (ref >90)
Glucose, Bld: 77 mg/dL (ref 70–99)
Potassium: 3.9 mmol/L (ref 3.5–5.1)
Sodium: 145 mmol/L (ref 135–145)
Total Bilirubin: 0.6 mg/dL (ref 0.3–1.2)
Total Protein: 7.1 g/dL (ref 6.0–8.3)

## 2014-11-03 LAB — ETHANOL: Alcohol, Ethyl (B): <5 mg/dL (ref 0–9)

## 2014-11-03 LAB — URINALYSIS, ROUTINE W REFLEX MICROSCOPIC
Bilirubin Urine: NEGATIVE
Glucose, UA: NEGATIVE mg/dL
Hgb urine dipstick: NEGATIVE
Ketones, ur: NEGATIVE mg/dL
Leukocytes, UA: NEGATIVE
Nitrite: NEGATIVE
Protein, ur: NEGATIVE mg/dL
Specific Gravity, Urine: 1.021 (ref 1.005–1.030)
Urobilinogen, UA: 1 mg/dL (ref 0.0–1.0)
pH: 6 (ref 5.0–8.0)

## 2014-11-03 LAB — RAPID URINE DRUG SCREEN, HOSP PERFORMED
Amphetamines: NOT DETECTED
Barbiturates: NOT DETECTED
Benzodiazepines: POSITIVE — AB
Cocaine: NOT DETECTED
Opiates: NOT DETECTED
Tetrahydrocannabinol: POSITIVE — AB

## 2014-11-03 LAB — LITHIUM LEVEL: Lithium Lvl: 0.65 mmol/L — ABNORMAL LOW (ref 0.80–1.40)

## 2014-11-03 LAB — AMMONIA: Ammonia: 20 umol/L (ref 11–32)

## 2014-11-03 MED ORDER — LORAZEPAM 1 MG PO TABS
1.0000 mg | ORAL_TABLET | Freq: Four times a day (QID) | ORAL | Status: DC | PRN
  Administered 2014-11-05 – 2014-11-06 (×3): 1 mg via ORAL
  Filled 2014-11-03: qty 1

## 2014-11-03 MED ORDER — LORAZEPAM 2 MG/ML IJ SOLN
1.0000 mg | Freq: Once | INTRAMUSCULAR | Status: AC
  Administered 2014-11-03: 1 mg via INTRAVENOUS
  Filled 2014-11-03: qty 1

## 2014-11-03 MED ORDER — LORAZEPAM 2 MG/ML IJ SOLN: 0.0000 mg | Freq: Four times a day (QID) | INTRAMUSCULAR | Status: DC

## 2014-11-03 MED ORDER — FOLIC ACID 1 MG PO TABS
1.0000 mg | ORAL_TABLET | Freq: Every day | ORAL | Status: DC
  Administered 2014-11-03 – 2014-11-06 (×4): 1 mg via ORAL
  Filled 2014-11-03 (×4): qty 1

## 2014-11-03 MED ORDER — LORAZEPAM 2 MG/ML IJ SOLN: 0.0000 mg | Freq: Two times a day (BID) | INTRAMUSCULAR | Status: DC

## 2014-11-03 MED ORDER — LORAZEPAM 1 MG PO TABS: 0.0000 mg | ORAL_TABLET | Freq: Two times a day (BID) | ORAL | Status: DC

## 2014-11-03 MED ORDER — MIDAZOLAM HCL 2 MG/2ML IJ SOLN
2.0000 mg | Freq: Once | INTRAMUSCULAR | Status: AC
  Administered 2014-11-03: 2 mg via INTRAVENOUS
  Filled 2014-11-03: qty 2

## 2014-11-03 MED ORDER — THIAMINE HCL 100 MG/ML IJ SOLN
100.0000 mg | Freq: Every day | INTRAMUSCULAR | Status: DC
  Filled 2014-11-03 (×3): qty 1

## 2014-11-03 MED ORDER — LORAZEPAM 2 MG/ML IJ SOLN
1.0000 mg | Freq: Four times a day (QID) | INTRAMUSCULAR | Status: DC | PRN
  Administered 2014-11-04 – 2014-11-05 (×2): 1 mg via INTRAVENOUS

## 2014-11-03 MED ORDER — MIDAZOLAM HCL 2 MG/2ML IJ SOLN
2.0000 mg | Freq: Once | INTRAMUSCULAR | Status: DC
  Filled 2014-11-03: qty 2

## 2014-11-03 MED ORDER — VITAMIN B-1 100 MG PO TABS
100.0000 mg | ORAL_TABLET | Freq: Every day | ORAL | Status: DC
  Administered 2014-11-03 – 2014-11-06 (×4): 100 mg via ORAL
  Filled 2014-11-03 (×4): qty 1

## 2014-11-03 MED ORDER — HEPARIN SODIUM (PORCINE) 5000 UNIT/ML IJ SOLN
5000.0000 [IU] | Freq: Three times a day (TID) | INTRAMUSCULAR | Status: DC
  Administered 2014-11-04 – 2014-11-06 (×7): 5000 [IU] via SUBCUTANEOUS
  Filled 2014-11-03 (×11): qty 1

## 2014-11-03 MED ORDER — ADULT MULTIVITAMIN W/MINERALS CH
1.0000 | ORAL_TABLET | Freq: Every day | ORAL | Status: DC
  Administered 2014-11-03 – 2014-11-06 (×4): 1 via ORAL
  Filled 2014-11-03 (×4): qty 1

## 2014-11-03 MED ORDER — LORAZEPAM 2 MG/ML IJ SOLN
1.0000 mg | Freq: Four times a day (QID) | INTRAMUSCULAR | Status: DC | PRN
  Administered 2014-11-06: 1 mg via INTRAVENOUS
  Filled 2014-11-03 (×3): qty 1

## 2014-11-03 MED ORDER — LORAZEPAM 1 MG PO TABS
4.0000 mg | ORAL_TABLET | Freq: Once | ORAL | Status: AC
  Administered 2014-11-03: 4 mg via ORAL
  Filled 2014-11-03: qty 4

## 2014-11-03 MED ORDER — ADULT MULTIVITAMIN W/MINERALS CH: 1.0000 | ORAL_TABLET | Freq: Every day | ORAL | Status: DC

## 2014-11-03 MED ORDER — LORAZEPAM 1 MG PO TABS: 0.0000 mg | ORAL_TABLET | Freq: Four times a day (QID) | ORAL | Status: DC

## 2014-11-03 MED ORDER — LORAZEPAM 1 MG PO TABS
1.0000 mg | ORAL_TABLET | Freq: Four times a day (QID) | ORAL | Status: DC | PRN
  Filled 2014-11-03 (×2): qty 1
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
## 2014-11-03 NOTE — ED Notes (Signed)
Dr Wyvonnia Dusky notified of pt CIWA score of 25. Meds to be ordered and EDP requested that pt be moved into a room when one is available

## 2014-11-03 NOTE — ED Notes (Signed)
FM at bedside to obtain labs

## 2014-11-03 NOTE — ED Notes (Addendum)
Pt traveling to MRI at this time with the transporter and this RN will attempt to call report at this time to the floor. Pt can be transported from MRI to the admission bed once MRI is complete. If transporter unavailable at that time then one of our NT can be called to transport.

## 2014-11-03 NOTE — ED Notes (Signed)
Pt's tremors have lessened significantly at this time

## 2014-11-03 NOTE — ED Notes (Signed)
Pt picked up at [facility] by EMS after fall. Per EMS- pt had unwitnessed fall outside store after falling down steps. Pt does not know how he fell but c/o back pain and multiple abrasions to bilateral hands, arms and abdomen. Pt does not know if he hit head or had LOC.  Pt has hx of multiple falls and tremors. 12 lead was unremarkable per EMS. Pt is A&O and in NAD. Pt denies ETOH use for a couple weeks.

## 2014-11-03 NOTE — BH Assessment (Signed)
TTS consult has been cancelled per [facility].

## 2014-11-03 NOTE — ED Notes (Signed)
Pt states that he hasn't drank alcohol in weeks, smokes marijuana but not in days and denies taking medication that wasn't prescribed to him. He states that he is hallucinating and CIWA is highly elevated.

## 2014-11-03 NOTE — BH Assessment (Signed)
Spoke with Lucien Mons, PA-C who reported that pt will be admitted medically; however she would like a TTS consult. Assessment will be initiated.

## 2014-11-03 NOTE — ED Notes (Signed)
Dr Wyvonnia Dusky gave verbal to hold versed at this time because he believes that pt may be having a psych issue vs DT's.

## 2014-11-03 NOTE — BH Assessment (Signed)
Assessment completed. Consulted Arlester Marker, NP who recommended that pt be re-evaluated once medically cleared. Robyn Hess, PA-C has been informed of the recommendation.

## 2014-11-03 NOTE — ED Notes (Signed)
Bed: [facility] Expected date:  Expected time:  Means of arrival:  Comments: EMS-fall 

## 2014-11-03 NOTE — H&P (Addendum)
Hospitalist Admission History and Physical  Patient name: [name] Medical record number: [id number] Date of birth: October 20, 1970 Age: 44 y.o. Gender: male  Primary Care Provider: No PCP Per Patient  Chief Complaint: encephalopathy, tremor,fall  History of Present Illness:This is a 44 y.o. year old male with significant past medical history of polysubstane,ETOH dependency, PTSD, schizophrenia, alcohol related seizures   presenting with encephalopathy,tremor,fall. Level V caveat as pt is confused.Pt was found in front of local store. Per report had an unwitnessed fall. Noted to have been recently admitted for ETOH withdrawal. Per repot, pt has not drank ETOH in > 3 weeks.  Afebrile, hemodynamically stale on presentation. WBC12.6, hgb 10.5, Cr 0.9, clinically dry. CT imaging WNL.  Per report, pt has had multiple rounds of ativan w/minimal improvement in tremor..  Assessment and Plan: [name] is a 44 y.o. year old male presenting with encephalopathy, tremor    Active Problems:   Encephalopathy   Tremors   1-Encephalopathy -multifactorial  -toxic-metabolic -head CT WNL  -f/u ammonia -CIWA protocol  -?medicatio component  -no overt sources of infection at present -follow closely  -psych and neuro consult pending  2- Tremors -hx/o ETOH assd seizures  -seizure vs DTs vs medication induced -f/u neuro recs (neuro consulted by EDP)  3- Psych  -hold oral meds pending formal psych consult   4-ETOH abuse -CIWA protocol  -ammonia level  -banana bag  FEN/GI: NPO  Prophylaxis: sub q heparin   Disposition: pending further evaluation  Code Status:Full Code   Patient Active Problem List   Diagnosis Date Noted  . Encephalopathy 11/03/2014  . Tremors 11/03/2014  . Alcohol dependence with uncomplicated withdrawal   . Schizophrenia, schizoaffective, chronic with acute exacerbation 10/24/2014  . Alcohol intoxication   . Chronic posttraumatic stress disorder   .  Protein-calorie malnutrition, severe 10/19/2014  . Hyponatremia 10/18/2014  . Elevated LFTs   . Alcohol abuse with physiological dependence   . Hallucinations, visual   . Noncompliance with medication regimen   . Hallucinations 07/07/2014  . Paranoia 07/07/2014  . Abnormal liver enzymes 02/14/2014  . Cannabis abuse 12/30/2013  . Schizoaffective disorder 12/29/2013  . Post traumatic stress disorder (PTSD) 11/03/2011  . Alcohol dependence 11/03/2011  . Active smoker 06/30/2011   Past Medical History: Past Medical History  Diagnosis Date  . Depression   . Psychosis   . PTSD (post-traumatic stress disorder)   . Seizure disorder     related to etoh seizure  . Alcohol abuse   . Schizoaffective disorder   . Anxiety     Past Surgical History: Past Surgical History  Procedure Laterality Date  . Foot surgery      right  . Hand surgery      Social History: History   Social History  . Marital Status: Single    Spouse Name: N/A  . Number of Children: N/A  . Years of Education: N/A   Social History Main Topics  . Smoking status: Current Every Day Smoker -- 1.50 packs/day for 24 years    Types: Cigarettes  . Smokeless tobacco: Never Used  . Alcohol Use: No     Comment: 6-8 cans beer per day, 24 ounce cans for past 1 month  . Drug Use: 1.00 per week    Special: Marijuana     Comment: THC 2 to 3 times per month  . Sexual Activity: Yes   Other Topics Concern  . None   Social History Narrative    Family History: Family  History  Problem Relation Age of Onset  . Alcoholism Mother   . CAD Father   . Depression Brother     Allergies: Allergies  Allergen Reactions  . Wellbutrin [Bupropion] Other (See Comments)    Makes me feel like I'm have a seizure    Current Facility-Administered Medications  Medication Dose Route Frequency Provider Last Rate Last Dose  . folic acid (FOLVITE) tablet 1 mg  1 mg Oral Daily Ezequiel Essex, MD   1 mg at 11/03/14 1859  . heparin  injection 5,000 Units  5,000 Units Subcutaneous 3 times per day Deneise Lever, MD      . LORazepam (ATIVAN) injection 0-4 mg  0-4 mg Intravenous Q6H Deneise Lever, MD       Followed by  . [START ON 11/05/2014] LORazepam (ATIVAN) injection 0-4 mg  0-4 mg Intravenous Q12H Deneise Lever, MD      . LORazepam (ATIVAN) tablet 1 mg  1 mg Oral Q6H PRN Ezequiel Essex, MD       Or  . LORazepam (ATIVAN) injection 1 mg  1 mg Intravenous Q6H PRN Ezequiel Essex, MD      . LORazepam (ATIVAN) tablet 1 mg  1 mg Oral Q6H PRN Deneise Lever, MD       Or  . LORazepam (ATIVAN) injection 1 mg  1 mg Intravenous Q6H PRN Deneise Lever, MD      . LORazepam (ATIVAN) tablet 0-4 mg  0-4 mg Oral Q6H Ezequiel Essex, MD   Stopped at 11/03/14 1859   Followed by  . [START ON 11/05/2014] LORazepam (ATIVAN) tablet 0-4 mg  0-4 mg Oral Q12H Ezequiel Essex, MD      . midazolam (VERSED) injection 2 mg  2 mg Intravenous Once Ezequiel Essex, MD   Stopped at 11/03/14 1908  . multivitamin with minerals tablet 1 tablet  1 tablet Oral Daily Ezequiel Essex, MD   1 tablet at 11/03/14 1859  . multivitamin with minerals tablet 1 tablet  1 tablet Oral Daily Deneise Lever, MD      . thiamine (VITAMIN B-1) tablet 100 mg  100 mg Oral Daily Ezequiel Essex, MD   100 mg at 11/03/14 1859   Or  . thiamine (B-1) injection 100 mg  100 mg Intravenous Daily Ezequiel Essex, MD       Current Outpatient Prescriptions  Medication Sig Dispense Refill  . gabapentin (NEURONTIN) 300 MG capsule Take 1 capsule (300 mg total) by mouth at bedtime. For agitation 30 capsule 0  . lithium carbonate 300 MG capsule Take 1 capsule (300 mg total) by mouth 2 (two) times daily with a meal. For mood stabilization 30 capsule 0  . LORazepam (ATIVAN) 1 MG tablet Take 1 tablet four times daily x 2 days: #8 tablets. Take 1 tablet three times daily x 2 days: #6 tablets. Take 1 tablet two times daily x 2 days #4: tablets. Take 1 tablet daily x 2 days: #2 tablets.  (Tapering dose format) (Patient taking differently: Take 1 mg by mouth 2 (two) times daily as needed for anxiety (anxiety). ) 20 tablet 0  . naproxen sodium (ANAPROX) 220 MG tablet Take 1 tablet (220 mg total) by mouth daily as needed (pain).    . nicotine (NICODERM CQ - DOSED IN MG/24 HOURS) 21 mg/24hr patch Place 1 patch (21 mg total) onto the skin daily. For nicotine addiction 28 patch 0  . QUEtiapine (SEROQUEL) 300 MG tablet Take 1 tablet (300 mg total)  by mouth at bedtime. For mood control 30 tablet 0  . sertraline (ZOLOFT) 50 MG tablet Take 1 tablet (50 mg total) by mouth daily. For depression 30 tablet 0  . traMADol (ULTRAM) 50 MG tablet Take 1 tablet (50 mg total) by mouth every 6 (six) hours as needed for moderate pain. 10 tablet 0  . zolpidem (AMBIEN) 10 MG tablet Take 1 tablet (10 mg total) by mouth at bedtime as needed for sleep. 7 tablet 0  . docusate sodium (COLACE) 100 MG capsule Take 1 capsule (100 mg total) by mouth 2 (two) times daily. For constipation (Patient not taking: Reported on 11/02/2014) 10 capsule 0  . Multiple Vitamin (MULTIVITAMIN WITH MINERALS) TABS tablet Take 1 tablet by mouth daily. For low vitamin (Patient not taking: Reported on 11/03/2014)    . thiamine 100 MG tablet Take 1 tablet (100 mg total) by mouth daily. For low thiamine (Patient not taking: Reported on 11/02/2014)    . zolpidem (AMBIEN) 10 MG tablet Take 1 tablet (10 mg total) by mouth at bedtime as needed for sleep. 1 tablet 0   Review Of Systems: 12 point ROS negative except as noted above in HPI.  Physical Exam: Filed Vitals:   11/03/14 2138  BP: 103/85  Pulse: 98  Temp:   Resp: 21    General: confuses, minimal agitation,+generalized tremor HEENT: PERRLA, extra ocular movement intact and dry oral mucosa Heart: S1, S2 normal, no murmur, rub or gallop, regular rate and rhythm Lungs: clear to auscultation, no wheezes or rales and unlabored breathing Abdomen: abdomen is soft without significant  tenderness, masses, organomegaly or guarding Extremities: extremities normal, atraumatic, no cyanosis or edema Skin:no rashes Neurology: uncooperative to exa  Labs and Imaging: Lab Results  Component Value Date/Time   NA 145 11/03/2014 06:13 PM   K 3.9 11/03/2014 06:13 PM   CL 112 11/03/2014 06:13 PM   CO2 23 11/03/2014 06:13 PM   BUN 10 11/03/2014 06:13 PM   CREATININE 0.89 11/03/2014 06:13 PM   GLUCOSE 77 11/03/2014 06:13 PM   Lab Results  Component Value Date   WBC 12.6* 11/03/2014   HGB 10.5* 11/03/2014   HCT 32.4* 11/03/2014   MCV 99.7 11/03/2014   PLT 649* 11/03/2014    Dg Lumbar Spine Complete  11/03/2014   CLINICAL DATA:  Fall, back pain  EXAM: LUMBAR SPINE - COMPLETE 4+ VIEW  COMPARISON:  10/18/2014  FINDINGS: Five lumbar type vertebral bodies.  Normal lumbar lordosis.  No evidence of fracture or dislocation. Vertebral heights and intervertebral disc spaces are maintained.  Visualized bony pelvis appears intact.  IMPRESSION: Normal lumbar spine radiographs.   Electronically Signed   By: Julian Hy M.D.   On: 11/03/2014 17:32   Dg Forearm Right  11/03/2014   CLINICAL DATA:  Patient states he hs had multiple falls over the past month. Patient is having pain located on the lateral aspect of the right forearm that radiates throughout the right forearm  EXAM: RIGHT FOREARM - 2 VIEW  COMPARISON:  None.  FINDINGS: No fracture dislocation of the right forearm, radius or ulna. Elbow joint and wrist joint are intact.  IMPRESSION: No fracture or dislocation.   Electronically Signed   By: Suzy Bouchard M.D.   On: 11/03/2014 17:36   Ct Head Wo Contrast  11/03/2014   CLINICAL DATA:  Unwitnessed fall, back pain  EXAM: CT HEAD WITHOUT CONTRAST  CT CERVICAL SPINE WITHOUT CONTRAST  TECHNIQUE: Multidetector CT imaging of the head and  cervical spine was performed following the standard protocol without intravenous contrast. Multiplanar CT image reconstructions of the cervical spine  were also generated.  COMPARISON:  CT head dated 10/18/2014  FINDINGS: CT HEAD FINDINGS  No evidence of parenchymal hemorrhage or extra-axial fluid collection. No mass lesion, mass effect, or midline shift.  No CT evidence of acute infarction.  Cerebral volume is within normal limits.  No ventriculomegaly.  The visualized paranasal sinuses are essentially clear. The mastoid air cells are unopacified.  No evidence of calvarial fracture.  CT CERVICAL SPINE FINDINGS  Reversal the normal cervical lordosis.  No evidence of fracture or dislocation. Vertebral body heights are maintained. Dens appears intact.  No prevertebral soft tissue swelling.  Mild degenerative changes of the mid cervical spine.  Visualized thyroid is unremarkable.  Visualized lung apices are clear.  IMPRESSION: Normal head CT.  No evidence of traumatic injury to the cervical spine. Mild degenerative changes.   Electronically Signed   By: Julian Hy M.D.   On: 11/03/2014 17:46   Ct Cervical Spine Wo Contrast  11/03/2014   CLINICAL DATA:  Unwitnessed fall, back pain  EXAM: CT HEAD WITHOUT CONTRAST  CT CERVICAL SPINE WITHOUT CONTRAST  TECHNIQUE: Multidetector CT imaging of the head and cervical spine was performed following the standard protocol without intravenous contrast. Multiplanar CT image reconstructions of the cervical spine were also generated.  COMPARISON:  CT head dated 10/18/2014  FINDINGS: CT HEAD FINDINGS  No evidence of parenchymal hemorrhage or extra-axial fluid collection. No mass lesion, mass effect, or midline shift.  No CT evidence of acute infarction.  Cerebral volume is within normal limits.  No ventriculomegaly.  The visualized paranasal sinuses are essentially clear. The mastoid air cells are unopacified.  No evidence of calvarial fracture.  CT CERVICAL SPINE FINDINGS  Reversal the normal cervical lordosis.  No evidence of fracture or dislocation. Vertebral body heights are maintained. Dens appears intact.  No  prevertebral soft tissue swelling.  Mild degenerative changes of the mid cervical spine.  Visualized thyroid is unremarkable.  Visualized lung apices are clear.  IMPRESSION: Normal head CT.  No evidence of traumatic injury to the cervical spine. Mild degenerative changes.   Electronically Signed   By: Julian Hy M.D.   On: 11/03/2014 17:46           Shanda Howells MD  Pager: [phone]

## 2014-11-03 NOTE — Progress Notes (Signed)
EDCM spoke to patient at bedside.  Patient reports his pcp should be at the [facility].  "I just haven't been set up yet."  Patient confirms he has never had an appointment at the [facility], but is willing to got there to establish care.

## 2014-11-03 NOTE — ED Notes (Signed)
Pt returned from MRI and will be transported by ED staff to [place].

## 2014-11-03 NOTE — ED Notes (Signed)
Patient transported to X-ray 

## 2014-11-03 NOTE — ED Notes (Signed)
Pt reports he was at [facility] a few weeks ago and had shaking then, reports he was d/c still shaking, but that shaking is worse today. Pt denies IV drug use or sex with men. Pt denies ETOH use.

## 2014-11-03 NOTE — ED Provider Notes (Signed)
CSN: [id number]     Arrival date & time 11/03/14  1635 History   First MD Initiated Contact with Patient 11/03/14 1637     Chief Complaint  Patient presents with  . Fall     (Consider location/radiation/quality/duration/timing/severity/associated sxs/prior Treatment) HPI Comments: 44 year old male presenting via EMS after an unwitnessed fall at [facility]. It appeared he fell down the steps. Patient unsure how he fell, states he's been dealing with balance difficulties for a while now. Unsure if he hit his head but didn't lose consciousness. He was admitted to behavioral health a few weeks back, and prior to that admission, he was admitted to the hospital with alcohol intoxication and hyponatremia. Currently he is complaining of low back pain, right forearm pain and pain where he has some scrapes. Denies recent alcohol use since hospital admission. Admits to only taking "a little bit" of his lithium. Back pain is nonradiating, worse with movement, 8/10. Currently states he is feeling dizzy which is normal for him, and sensation of unsteadiness. Per EMS, one of the firemen was exposed to the patient's blood and is requesting body fluid exposure panel.  Patient is a 44 y.o. male presenting with fall. The history is provided by the patient and the EMS personnel.  Fall Associated symptoms include arthralgias.    Past Medical History  Diagnosis Date  . Depression   . Psychosis   . PTSD (post-traumatic stress disorder)   . Seizure disorder     related to etoh seizure  . Alcohol abuse   . Schizoaffective disorder   . Anxiety    Past Surgical History  Procedure Laterality Date  . Foot surgery      right  . Hand surgery     Family History  Problem Relation Age of Onset  . Alcoholism Mother   . CAD Father   . Depression Brother    History  Substance Use Topics  . Smoking status: Current Every Day Smoker -- 1.50 packs/day for 24 years    Types: Cigarettes  . Smokeless tobacco:  Never Used  . Alcohol Use: No     Comment: 6-8 cans beer per day, 24 ounce cans for past 1 month    Review of Systems  Musculoskeletal: Positive for back pain and arthralgias.  Skin: Positive for wound.  Neurological: Positive for dizziness and tremors.  All other systems reviewed and are negative.     Allergies  Wellbutrin  Home Medications   Prior to Admission medications   Medication Sig Start Date End Date Taking? Authorizing Provider  gabapentin (NEURONTIN) 300 MG capsule Take 1 capsule (300 mg total) by mouth at bedtime. For agitation 10/30/14  Yes Encarnacion Slates, NP  lithium carbonate 300 MG capsule Take 1 capsule (300 mg total) by mouth 2 (two) times daily with a meal. For mood stabilization 10/30/14  Yes Encarnacion Slates, NP  LORazepam (ATIVAN) 1 MG tablet Take 1 tablet four times daily x 2 days: #8 tablets. Take 1 tablet three times daily x 2 days: #6 tablets. Take 1 tablet two times daily x 2 days #4: tablets. Take 1 tablet daily x 2 days: #2 tablets. (Tapering dose format) Patient taking differently: Take 1 mg by mouth 2 (two) times daily as needed for anxiety (anxiety).  10/30/14  Yes Encarnacion Slates, NP  naproxen sodium (ANAPROX) 220 MG tablet Take 1 tablet (220 mg total) by mouth daily as needed (pain). 10/30/14  Yes Encarnacion Slates, NP  nicotine (NICODERM CQ -  DOSED IN MG/24 HOURS) 21 mg/24hr patch Place 1 patch (21 mg total) onto the skin daily. For nicotine addiction 10/30/14  Yes Encarnacion Slates, NP  QUEtiapine (SEROQUEL) 300 MG tablet Take 1 tablet (300 mg total) by mouth at bedtime. For mood control 10/30/14  Yes Encarnacion Slates, NP  sertraline (ZOLOFT) 50 MG tablet Take 1 tablet (50 mg total) by mouth daily. For depression 10/30/14  Yes Encarnacion Slates, NP  traMADol (ULTRAM) 50 MG tablet Take 1 tablet (50 mg total) by mouth every 6 (six) hours as needed for moderate pain. 10/30/14  Yes Encarnacion Slates, NP  zolpidem (AMBIEN) 10 MG tablet Take 1 tablet (10 mg total) by mouth at bedtime  as needed for sleep. 10/30/14 11/29/14 Yes Encarnacion Slates, NP  docusate sodium (COLACE) 100 MG capsule Take 1 capsule (100 mg total) by mouth 2 (two) times daily. For constipation Patient not taking: Reported on 11/02/2014 10/30/14   Encarnacion Slates, NP  Multiple Vitamin (MULTIVITAMIN WITH MINERALS) TABS tablet Take 1 tablet by mouth daily. For low vitamin Patient not taking: Reported on 11/03/2014 10/30/14   Encarnacion Slates, NP  thiamine 100 MG tablet Take 1 tablet (100 mg total) by mouth daily. For low thiamine Patient not taking: Reported on 11/02/2014 10/30/14   Encarnacion Slates, NP  zolpidem (AMBIEN) 10 MG tablet Take 1 tablet (10 mg total) by mouth at bedtime as needed for sleep. 07/16/11 08/15/11  Cherene Altes, MD   BP 116/81 mmHg  Pulse 97  Temp(Src) 99.2 F (37.3 C) (Oral)  Resp 20  Ht 5\' 10"  (1.778 m)  Wt 204 lb (92.534 kg)  BMI 29.27 kg/m2  SpO2 97% Physical Exam  Constitutional: He is oriented to person, place, and time. He appears well-developed and well-nourished. No distress.  HENT:  Head: Normocephalic and atraumatic.  Mouth/Throat: Oropharynx is clear and moist.  Eyes: Conjunctivae are normal. Pupils are equal, round, and reactive to light.  Pupils dilated, equal, round and reactive bilateral. Horizontal nystagmus on both left and right lateral gaze.  Neck: Normal range of motion. Neck supple. No spinous process tenderness and no muscular tenderness present.  Cardiovascular: Normal rate, regular rhythm and normal heart sounds.   Pulmonary/Chest: Effort normal and breath sounds normal. No respiratory distress.  Abdominal: Soft. Bowel sounds are normal. He exhibits no distension. There is no tenderness. There is no rebound.  Musculoskeletal:  Lumbar spine- TTP midline and bilateral paraspinal muscles. Full range of motion. Slow to sit up. Right forearm TTP proximally with overlying abrasion and mild swelling. No elbow tenderness. Full elbow range of motion without pain. Wrist  normal. C-spine nontender. Full range of motion. T-spine nontender.  Neurological: He is alert and oriented to person, place, and time. He has normal strength. He displays tremor (continuous upper extremity arm tremors, R>L). No sensory deficit. GCS eye subscore is 4. GCS verbal subscore is 5. GCS motor subscore is 6.  Strength lower extremities 5/5 and equal bilateral. Sensation intact. Normal gait.  Skin: Skin is warm and dry. No rash noted. He is not diaphoretic.  Multiple abrasions, no active bleeding on hands, bilateral knees (pt states these are old), right forearm, and left side of abdomen.  Psychiatric: His mood appears anxious. He is withdrawn and actively hallucinating (visual).  Nursing note and vitals reviewed.   ED Course  Procedures (including critical care time)  CRITICAL CARE Performed by: Lucien Mons   Total critical care time: 40 minutes  Critical care time was exclusive of separately billable procedures and treating other patients.  Critical care was necessary to treat or prevent imminent or life-threatening deterioration.  Critical care was time spent personally by me on the following activities: development of treatment plan with patient and/or surrogate as well as nursing, discussions with consultants, evaluation of patient's response to treatment, examination of patient, obtaining history from patient or surrogate, ordering and performing treatments and interventions, ordering and review of laboratory studies, ordering and review of radiographic studies, pulse oximetry and re-evaluation of patient's condition.  Labs Review Labs Reviewed  CBC WITH DIFFERENTIAL/PLATELET - Abnormal; Notable for the following:    WBC 12.6 (*)    RBC 3.25 (*)    Hemoglobin 10.5 (*)    HCT 32.4 (*)    Platelets 649 (*)    Neutrophils Relative % 81 (*)    Neutro Abs 10.1 (*)    Lymphocytes Relative 10 (*)    Monocytes Absolute 1.2 (*)    All other components within normal limits   COMPREHENSIVE METABOLIC PANEL - Abnormal; Notable for the following:    AST 97 (*)    ALT 120 (*)    All other components within normal limits  URINE RAPID DRUG SCREEN (HOSP PERFORMED) - Abnormal; Notable for the following:    Benzodiazepines POSITIVE (*)    Tetrahydrocannabinol POSITIVE (*)    All other components within normal limits  LITHIUM LEVEL - Abnormal; Notable for the following:    Lithium Lvl 0.65 (*)    All other components within normal limits  ETHANOL  URINALYSIS, ROUTINE W REFLEX MICROSCOPIC  RAPID HIV SCREEN (HIV 1/2 AB+AG)  AMMONIA  HEPATITIS B SURFACE ANTIGEN  HEPATITIS C ANTIBODY (REFLEX)  HIV 1 RNA QUANT-NO REFLEX-BLD    Imaging Review Dg Lumbar Spine Complete  11/03/2014   CLINICAL DATA:  Fall, back pain  EXAM: LUMBAR SPINE - COMPLETE 4+ VIEW  COMPARISON:  10/18/2014  FINDINGS: Five lumbar type vertebral bodies.  Normal lumbar lordosis.  No evidence of fracture or dislocation. Vertebral heights and intervertebral disc spaces are maintained.  Visualized bony pelvis appears intact.  IMPRESSION: Normal lumbar spine radiographs.   Electronically Signed   By: Julian Hy M.D.   On: 11/03/2014 17:32   Dg Forearm Right  11/03/2014   CLINICAL DATA:  Patient states he hs had multiple falls over the past month. Patient is having pain located on the lateral aspect of the right forearm that radiates throughout the right forearm  EXAM: RIGHT FOREARM - 2 VIEW  COMPARISON:  None.  FINDINGS: No fracture dislocation of the right forearm, radius or ulna. Elbow joint and wrist joint are intact.  IMPRESSION: No fracture or dislocation.   Electronically Signed   By: Suzy Bouchard M.D.   On: 11/03/2014 17:36   Ct Head Wo Contrast  11/03/2014   CLINICAL DATA:  Unwitnessed fall, back pain  EXAM: CT HEAD WITHOUT CONTRAST  CT CERVICAL SPINE WITHOUT CONTRAST  TECHNIQUE: Multidetector CT imaging of the head and cervical spine was performed following the standard protocol without  intravenous contrast. Multiplanar CT image reconstructions of the cervical spine were also generated.  COMPARISON:  CT head dated 10/18/2014  FINDINGS: CT HEAD FINDINGS  No evidence of parenchymal hemorrhage or extra-axial fluid collection. No mass lesion, mass effect, or midline shift.  No CT evidence of acute infarction.  Cerebral volume is within normal limits.  No ventriculomegaly.  The visualized paranasal sinuses are essentially clear. The mastoid air cells  are unopacified.  No evidence of calvarial fracture.  CT CERVICAL SPINE FINDINGS  Reversal the normal cervical lordosis.  No evidence of fracture or dislocation. Vertebral body heights are maintained. Dens appears intact.  No prevertebral soft tissue swelling.  Mild degenerative changes of the mid cervical spine.  Visualized thyroid is unremarkable.  Visualized lung apices are clear.  IMPRESSION: Normal head CT.  No evidence of traumatic injury to the cervical spine. Mild degenerative changes.   Electronically Signed   By: Julian Hy M.D.   On: 11/03/2014 17:46   Ct Cervical Spine Wo Contrast  11/03/2014   CLINICAL DATA:  Unwitnessed fall, back pain  EXAM: CT HEAD WITHOUT CONTRAST  CT CERVICAL SPINE WITHOUT CONTRAST  TECHNIQUE: Multidetector CT imaging of the head and cervical spine was performed following the standard protocol without intravenous contrast. Multiplanar CT image reconstructions of the cervical spine were also generated.  COMPARISON:  CT head dated 10/18/2014  FINDINGS: CT HEAD FINDINGS  No evidence of parenchymal hemorrhage or extra-axial fluid collection. No mass lesion, mass effect, or midline shift.  No CT evidence of acute infarction.  Cerebral volume is within normal limits.  No ventriculomegaly.  The visualized paranasal sinuses are essentially clear. The mastoid air cells are unopacified.  No evidence of calvarial fracture.  CT CERVICAL SPINE FINDINGS  Reversal the normal cervical lordosis.  No evidence of fracture or  dislocation. Vertebral body heights are maintained. Dens appears intact.  No prevertebral soft tissue swelling.  Mild degenerative changes of the mid cervical spine.  Visualized thyroid is unremarkable.  Visualized lung apices are clear.  IMPRESSION: Normal head CT.  No evidence of traumatic injury to the cervical spine. Mild degenerative changes.   Electronically Signed   By: Julian Hy M.D.   On: 11/03/2014 17:46   Mr Brain Wo Contrast  11/03/2014   CLINICAL DATA:  Syncopal episode at [facility]. Concurrent auditory hallucinations. History of schizoaffective disorder. Evaluate encephalopathy.  EXAM: MRI HEAD WITHOUT CONTRAST  TECHNIQUE: Multiplanar, multiecho pulse sequences of the brain and surrounding structures were obtained without intravenous contrast.  COMPARISON:  CT of the head November 02, 2013 at 1725 hours  FINDINGS: Motion degraded examination.  Mild to moderate ventriculomegaly, likely on the basis of global parenchymal brain volume loss as there is overall commensurate enlargement of cerebral sulci and cerebellar folia. Minimal white matter changes suggest chronic small vessel ischemic disease, motion may obscure additional areas of subtle parenchymal signal abnormality.  . No abnormal parenchymal signal, mass lesions, mass effect. No reduced diffusion to suggest acute ischemia. No susceptibility artifact to suggest hemorrhage though gradient sequences severely motion degraded.  No abnormal extra-axial fluid collections. No extra-axial masses though, contrast enhanced sequences would be more sensitive. Normal major intracranial vascular flow voids seen at the skull base.  Ocular globes and orbital contents are unremarkable though not tailored for evaluation. No abnormal sellar expansion. Mild paranasal sinus mucosal thickening without air-fluid levels. Small RIGHT mastoid effusion. No suspicious calvarial bone marrow signal. No abnormal sellar expansion. Craniocervical junction  maintained.  IMPRESSION: No acute intracranial process on this motion degraded examination.  Mild to moderate parenchymal brain volume loss, advanced for age.   Electronically Signed   By: Elon Alas   On: 11/03/2014 23:46     EKG Interpretation None      MDM   Final diagnoses:  DTs (delirium tremens)   Patient presenting after unwitnessed fall, significant history of alcohol abuse. Recent admission as dictated above.  Labs at baseline. Alcohol level normal. Lithium level 0.65. Imaging studies as stated above. Patient continues to have tremors in all extremities. No improvement with Versed and Ativan. Concern for DTs, it is unknown if he truly has not drank over the past 3 weeks. TTS consult appreciated and feels he will need to be re-evaluated by psychiatry in the morning. Pt will be admitted to step-down, accepted by Dr. Ernestina Patches, [facility]. Neuro consulted as requested by Dr. Ernestina Patches, I spoke with Dr. Doy Mince who will evaluate patient on admission.  Discussed with attending Dr. Wyvonnia Dusky who also evaluated patient and agrees with plan of care.   Carman Ching, PA-C 11/04/14 6160  Ezequiel Essex, MD 11/04/14 [phone]

## 2014-11-03 NOTE — ED Notes (Signed)
Dr Wyvonnia Dusky and Bailey Mech notified that pt had no response to versed. Dr Wyvonnia Dusky assessed pt and verbally ordered 2mg  Versed to be repeated

## 2014-11-04 DIAGNOSIS — F1010 Alcohol abuse, uncomplicated: Secondary | ICD-10-CM | POA: Diagnosis not present

## 2014-11-04 DIAGNOSIS — F250 Schizoaffective disorder, bipolar type: Secondary | ICD-10-CM | POA: Diagnosis not present

## 2014-11-04 DIAGNOSIS — G9340 Encephalopathy, unspecified: Secondary | ICD-10-CM | POA: Diagnosis not present

## 2014-11-04 DIAGNOSIS — G252 Other specified forms of tremor: Secondary | ICD-10-CM

## 2014-11-04 DIAGNOSIS — R55 Syncope and collapse: Secondary | ICD-10-CM | POA: Diagnosis not present

## 2014-11-04 LAB — HEPATITIS B SURFACE ANTIGEN: Hepatitis B Surface Ag: NEGATIVE

## 2014-11-04 MED ORDER — LORAZEPAM 2 MG/ML IJ SOLN
0.0000 mg | Freq: Two times a day (BID) | INTRAMUSCULAR | Status: DC
  Administered 2014-11-06: 1 mg via INTRAVENOUS
  Filled 2014-11-04 (×2): qty 1

## 2014-11-04 MED ORDER — LORAZEPAM 2 MG/ML IJ SOLN: 0.0000 mg | Freq: Four times a day (QID) | INTRAMUSCULAR | Status: DC

## 2014-11-04 MED ORDER — LORAZEPAM 2 MG/ML IJ SOLN
0.0000 mg | Freq: Four times a day (QID) | INTRAMUSCULAR | Status: AC
  Administered 2014-11-04 (×2): 1 mg via INTRAVENOUS
  Administered 2014-11-04: 2 mg via INTRAVENOUS
  Administered 2014-11-04: 4 mg via INTRAVENOUS
  Administered 2014-11-05 (×2): 1 mg via INTRAVENOUS
  Administered 2014-11-05: 2 mg via INTRAVENOUS
  Filled 2014-11-04 (×4): qty 1
  Filled 2014-11-04: qty 2
  Filled 2014-11-04 (×2): qty 1

## 2014-11-04 MED ORDER — LORAZEPAM 2 MG/ML IJ SOLN: 0.0000 mg | Freq: Two times a day (BID) | INTRAMUSCULAR | Status: DC

## 2014-11-04 MED ORDER — ADULT MULTIVITAMIN W/MINERALS CH: 1.0000 | ORAL_TABLET | Freq: Every day | ORAL | Status: DC

## 2014-11-04 NOTE — Evaluation (Signed)
Physical Therapy Evaluation Patient Details Name: [name] MRN: [id number] DOB: 1970-10-21 Today's Date: 11/04/2014   History of Present Illness  44 yr old admitted s/p fall with dx of encephalopathy and hx of schizoaffective disorder, hx ETOH abuse and depression, PTSD  Clinical Impression  Pt currently mobilizing with min assist and cueing for safety/balance.  Pt should progress to d/c home with intermitant assist of family and would greatly benefit from follow up HHPT to continue to address balance deficits.    Follow Up Recommendations Home health PT    Equipment Recommendations  None recommended by PT (Pt unsure of location of RW after fall - nursing investigati)    Recommendations for Other Services OT consult     Precautions / Restrictions Precautions Precautions: Fall Precaution Comments: Hx of falls at home Restrictions Weight Bearing Restrictions: No      Mobility  Bed Mobility Overal bed mobility: Modified Independent Bed Mobility: Supine to Sit     Supine to sit: Modified independent (Device/Increase time)     General bed mobility comments: incr time  Transfers Overall transfer level: Needs assistance Equipment used: Rolling walker (2 wheeled) Transfers: Sit to/from Stand Sit to Stand: Min guard         General transfer comment: verbal cues for hand placement and safety  Ambulation/Gait Ambulation/Gait assistance: Min assist;Min guard Ambulation Distance (Feet): 600 Feet (300' with RW and 300' with HHA.) Assistive device: Rolling walker (2 wheeled);None Gait Pattern/deviations: Step-through pattern;Decreased step length - right;Decreased step length - left;Shuffle;Trunk flexed;Wide base of support Gait velocity: Pt attempting to move too quickly with almost festinating type gait.  Marked improvement in stability with cues to slow pace and to correct position from RW   General Gait Details: Wide BOS and festinating type gait but with marked  improvement in stability with frequent cues to slow pace  Stairs            Wheelchair Mobility    Modified Rankin (Stroke Patients Only)       Balance Overall balance assessment: Needs assistance Sitting-balance support: No upper extremity supported Sitting balance-Leahy Scale: Good     Standing balance support: No upper extremity supported Standing balance-Leahy Scale: Fair               High level balance activites: Side stepping;Backward walking;Direction changes;Turns High Level Balance Comments: Marked improvement with repetition and frequent cues to slow pace             Pertinent Vitals/Pain Pain Assessment: No/denies pain Pain Score: 3     Home Living Family/patient expects to be discharged to:: Private residence Living Arrangements: Alone Available Help at Discharge: Family;Available PRN/intermittently Type of Home: Apartment Home Access: Stairs to enter Entrance Stairs-Rails: Left Entrance Stairs-Number of Steps: 13 Home Layout: One level Home Equipment: Walker - 2 wheels Additional Comments: Pt states he had RW with him at [employer] when he fell but uncertain about its current location - nursign checking with ED    Prior Function Level of Independence: Independent;Independent with assistive device(s)               Hand Dominance   Dominant Hand: Right    Extremity/Trunk Assessment   Upper Extremity Assessment: RUE deficits/detail;LUE deficits/detail           Lower Extremity Assessment: RLE deficits/detail;LLE deficits/detail         Communication   Communication: No difficulties  Cognition Arousal/Alertness: Awake/alert Behavior During Therapy: WFL for tasks assessed/performed Overall Cognitive  Status: Within Functional Limits for tasks assessed                      General Comments      Exercises        Assessment/Plan    PT Assessment Patient needs continued PT services  PT Diagnosis  Abnormality of gait   PT Problem List Decreased activity tolerance;Decreased balance;Decreased mobility;Decreased knowledge of use of DME;Decreased safety awareness  PT Treatment Interventions DME instruction;Gait training;Stair training;Functional mobility training;Therapeutic activities;Therapeutic exercise;Balance training;Patient/family education   PT Goals (Current goals can be found in the Care Plan section) Acute Rehab PT Goals Patient Stated Goal: to be safe at home PT Goal Formulation: With patient Time For Goal Achievement: 11/05/14 Potential to Achieve Goals: Good    Frequency Min 3X/week   Barriers to discharge Inaccessible home environment;Decreased caregiver support      Co-evaluation               End of Session Equipment Utilized During Treatment: Gait belt Activity Tolerance: Patient tolerated treatment well Patient left: in chair;with call bell/phone within reach;with nursing/sitter in room Nurse Communication: Mobility status    Functional Assessment Tool Used: clinical judgement Functional Limitation: Mobility: Walking and moving around Mobility: Walking and Moving Around Current Status [phone]): At least 20 percent but less than 40 percent impaired, limited or restricted Mobility: Walking and Moving Around Goal Status [phone]): At least 1 percent but less than 20 percent impaired, limited or restricted    Time: 9480-1655 PT Time Calculation (min) (ACUTE ONLY): 38 min   Charges:   PT Evaluation $Initial PT Evaluation Tier I: 1 Procedure PT Treatments $Gait Training: 8-22 mins $Therapeutic Exercise: 8-22 mins   PT G Codes:   PT G-Codes **NOT FOR INPATIENT CLASS** Functional Assessment Tool Used: clinical judgement Functional Limitation: Mobility: Walking and moving around Mobility: Walking and Moving Around Current Status ([id number]): At least 20 percent but less than 40 percent impaired, limited or restricted Mobility: Walking and Moving Around Goal  Status [phone]): At least 1 percent but less than 20 percent impaired, limited or restricted    [facility] 11/04/2014, 3:13 PM

## 2014-11-04 NOTE — Consult Note (Addendum)
Reason for Consult:Tremor Referring Physician: Ernestina Patches  CC: Tremor  HPI: [name] is an 44 y.o. male with significant past medical history of polysubstane,ETOH dependency, PTSD, schizophrenia, alcohol related seizures,who was brought to the ED after being found down in front of a store.  Per the patient he reports that he was told that he fell down but he does not remember anything that happened.  He reports that he has been off balance, having falls and tremor since some time in February.  From review of the records he was admitted in February due to alcohol withdrawal.  Was noted to have tremor and has been receiving physical therapy since that time.  Reports that since that admission he has not had any further alcohol.    Past Medical History  Diagnosis Date  . Depression   . Psychosis   . PTSD (post-traumatic stress disorder)   . Seizure disorder     related to etoh seizure  . Alcohol abuse   . Schizoaffective disorder   . Anxiety     Past Surgical History  Procedure Laterality Date  . Foot surgery      right  . Hand surgery      Family History  Problem Relation Age of Onset  . Alcoholism Mother   . CAD Father   . Depression Brother   Father died of a MI.    Social History:  reports that he has been smoking Cigarettes.  He has a 36 pack-year smoking history. He has never used smokeless tobacco. He reports that he uses illicit drugs (Marijuana) about once per week. He reports that he does not drink alcohol.  Allergies  Allergen Reactions  . Wellbutrin [Bupropion] Other (See Comments)    Makes me feel like I'm have a seizure    Medications:  I have reviewed the patient's current medications. Prior to Admission:  Prescriptions prior to admission  Medication Sig Dispense Refill Last Dose  . gabapentin (NEURONTIN) 300 MG capsule Take 1 capsule (300 mg total) by mouth at bedtime. For agitation 30 capsule 0 11/02/2014 at Unknown time  . lithium carbonate 300 MG  capsule Take 1 capsule (300 mg total) by mouth 2 (two) times daily with a meal. For mood stabilization 30 capsule 0 11/03/2014 at 0430  . LORazepam (ATIVAN) 1 MG tablet Take 1 tablet four times daily x 2 days: #8 tablets. Take 1 tablet three times daily x 2 days: #6 tablets. Take 1 tablet two times daily x 2 days #4: tablets. Take 1 tablet daily x 2 days: #2 tablets. (Tapering dose format) (Patient taking differently: Take 1 mg by mouth 2 (two) times daily as needed for anxiety (anxiety). ) 20 tablet 0 11/02/2014 at Unknown time  . naproxen sodium (ANAPROX) 220 MG tablet Take 1 tablet (220 mg total) by mouth daily as needed (pain).   Past Week at Unknown time  . nicotine (NICODERM CQ - DOSED IN MG/24 HOURS) 21 mg/24hr patch Place 1 patch (21 mg total) onto the skin daily. For nicotine addiction 28 patch 0 Past Week at Unknown time  . QUEtiapine (SEROQUEL) 300 MG tablet Take 1 tablet (300 mg total) by mouth at bedtime. For mood control 30 tablet 0 11/02/2014 at Unknown time  . sertraline (ZOLOFT) 50 MG tablet Take 1 tablet (50 mg total) by mouth daily. For depression 30 tablet 0 11/03/2014 at Unknown time  . traMADol (ULTRAM) 50 MG tablet Take 1 tablet (50 mg total) by mouth every 6 (six) hours  as needed for moderate pain. 10 tablet 0 11/02/2014 at Unknown time  . zolpidem (AMBIEN) 10 MG tablet Take 1 tablet (10 mg total) by mouth at bedtime as needed for sleep. 7 tablet 0 Past Week at Unknown time  . docusate sodium (COLACE) 100 MG capsule Take 1 capsule (100 mg total) by mouth 2 (two) times daily. For constipation (Patient not taking: Reported on 11/02/2014) 10 capsule 0 Not Taking at Unknown time  . Multiple Vitamin (MULTIVITAMIN WITH MINERALS) TABS tablet Take 1 tablet by mouth daily. For low vitamin (Patient not taking: Reported on 11/03/2014)   Not Taking at Unknown time  . thiamine 100 MG tablet Take 1 tablet (100 mg total) by mouth daily. For low thiamine (Patient not taking: Reported on 11/02/2014)    Not Taking at Unknown time  . zolpidem (AMBIEN) 10 MG tablet Take 1 tablet (10 mg total) by mouth at bedtime as needed for sleep. 1 tablet 0    Scheduled: . folic acid  1 mg Oral Daily  . heparin  5,000 Units Subcutaneous 3 times per day  . LORazepam  0-4 mg Intravenous 4 times per day   Followed by  . [START ON 11/06/2014] LORazepam  0-4 mg Intravenous Q12H  . midazolam  2 mg Intravenous Once  . multivitamin with minerals  1 tablet Oral Daily  . thiamine  100 mg Oral Daily   Or  . thiamine  100 mg Intravenous Daily    ROS: History obtained from the patient  General ROS: negative for - chills, fatigue, fever, night sweats, weight gain or weight loss Psychological ROS: negative for - behavioral disorder, hallucinations, memory difficulties, mood swings or suicidal ideation Ophthalmic ROS: negative for - blurry vision, double vision, eye pain or loss of vision ENT ROS: negative for - epistaxis, nasal discharge, oral lesions, sore throat, tinnitus or vertigo Allergy and Immunology ROS: negative for - hives or itchy/watery eyes Hematological and Lymphatic ROS: negative for - bleeding problems, bruising or swollen lymph nodes Endocrine ROS: negative for - galactorrhea, hair pattern changes, polydipsia/polyuria or temperature intolerance Respiratory ROS: negative for - cough, hemoptysis, shortness of breath or wheezing Cardiovascular ROS: negative for - chest pain, dyspnea on exertion, edema or irregular heartbeat Gastrointestinal ROS: negative for - abdominal pain, diarrhea, hematemesis, nausea/vomiting or stool incontinence Genito-Urinary ROS: negative for - dysuria, hematuria, incontinence or urinary frequency/urgency Musculoskeletal ROS: left leg pain Neurological ROS: as noted in HPI Dermatological ROS: negative for rash and skin lesion changes  Physical Examination: Blood pressure 118/77, pulse 90, temperature 98.7 F (37.1 C), temperature source Oral, resp. rate 22, height 5\' 10"   (1.778 m), weight 92.534 kg (204 lb), SpO2 99 %.  HEENT-  Normocephalic, no lesions, without obvious abnormality.  Normal external eye and conjunctiva.  Normal TM's bilaterally.  Normal auditory canals and external ears. Normal external nose, mucus membranes and septum.  Normal pharynx. Cardiovascular- S1, S2 normal, pulses palpable throughout   Lungs- chest clear, no wheezing, rales, normal symmetric air entry Abdomen- soft, non-tender; bowel sounds normal; no masses,  no organomegaly Extremities- no edema Lymph-no adenopathy palpable Musculoskeletal-no joint tenderness, deformity or swelling Skin-extensive lower extremity bruising  Neurological Examination Mental Status: Alert and awake.  Knows where he is and that it is March 2016.  Reports that it is the 16th.  Reports there are 5 quarters in $1.75.  Spells WORLD forward, backward [id number].  Speech fluent without evidence of aphasia.  Able to follow 3 step commands without difficulty. Cranial  Nerves: II: Discs flat bilaterally; Visual fields grossly normal, pupils equal, round, reactive to light and accommodation III,IV, VI: ptosis not present, extra-ocular motions intact bilaterally V,VII: smile symmetric, facial light touch sensation normal bilaterally VIII: hearing normal bilaterally IX,X: gag reflex present XI: bilateral shoulder shrug XII: midline tongue extension Motor: Right : Upper extremity   5/5    Left:     Upper extremity   5/5  Lower extremity   5/5     Lower extremity   5/5 with limitations secondary to pain Tone and bulk:normal tone throughout; no atrophy noted.  Coarse BUE tremor Sensory: Pinprick and light touch intact throughout, bilaterally.  Patient not reliable in vibratory sensation testing Deep Tendon Reflexes: 2+ and symmetric throughout Plantars: Right: downgoing   Left: downgoing Cerebellar: normal finger-to-nose testing with tremor and normal heel-to-shin testing bilaterally Gait: not tested due to  safety concerns    Laboratory Studies:   Basic Metabolic Panel:  Recent Labs Lab 11/03/14 1813  NA 145  K 3.9  CL 112  CO2 23  GLUCOSE 77  BUN 10  CREATININE 0.89  CALCIUM 9.3    Liver Function Tests:  Recent Labs Lab 11/03/14 1813  AST 97*  ALT 120*  ALKPHOS 85  BILITOT 0.6  PROT 7.1  ALBUMIN 3.8   No results for input(s): LIPASE, AMYLASE in the last 168 hours.  Recent Labs Lab 11/03/14 2204  AMMONIA 20    CBC:  Recent Labs Lab 11/03/14 1813  WBC 12.6*  NEUTROABS 10.1*  HGB 10.5*  HCT 32.4*  MCV 99.7  PLT 649*    Cardiac Enzymes: No results for input(s): CKTOTAL, CKMB, CKMBINDEX, TROPONINI in the last 168 hours.  BNP: Invalid input(s): POCBNP  CBG: No results for input(s): GLUCAP in the last 168 hours.  Microbiology: Results for orders placed or performed during the hospital encounter of 10/18/14  MRSA PCR Screening     Status: None   Collection Time: 10/19/14  7:00 AM  Result Value Ref Range Status   MRSA by PCR NEGATIVE NEGATIVE Final    Comment:        The GeneXpert MRSA Assay (FDA approved for NASAL specimens only), is one component of a comprehensive MRSA colonization surveillance program. It is not intended to diagnose MRSA infection nor to guide or monitor treatment for MRSA infections.   Clostridium Difficile by PCR     Status: None   Collection Time: 10/19/14  7:45 PM  Result Value Ref Range Status   C difficile by pcr NEGATIVE NEGATIVE Final    Coagulation Studies: No results for input(s): LABPROT, INR in the last 72 hours.  Urinalysis:  Recent Labs Lab 11/03/14 1804  COLORURINE YELLOW  LABSPEC 1.021  PHURINE 6.0  GLUCOSEU NEGATIVE  HGBUR NEGATIVE  BILIRUBINUR NEGATIVE  KETONESUR NEGATIVE  PROTEINUR NEGATIVE  UROBILINOGEN 1.0  NITRITE NEGATIVE  LEUKOCYTESUR NEGATIVE    Lipid Panel:  No results found for: CHOL, TRIG, HDL, CHOLHDL, VLDL, LDLCALC  HgbA1C: No results found for: HGBA1C  Urine Drug  Screen:     Component Value Date/Time   LABOPIA NONE DETECTED 11/03/2014 1804   COCAINSCRNUR NONE DETECTED 11/03/2014 1804   LABBENZ POSITIVE* 11/03/2014 1804   AMPHETMU NONE DETECTED 11/03/2014 1804   THCU POSITIVE* 11/03/2014 1804   LABBARB NONE DETECTED 11/03/2014 1804    Alcohol Level:  Recent Labs Lab 11/03/14 1813  ETH <5    Imaging: Dg Lumbar Spine Complete  11/03/2014   CLINICAL DATA:  Fall, back  pain  EXAM: LUMBAR SPINE - COMPLETE 4+ VIEW  COMPARISON:  10/18/2014  FINDINGS: Five lumbar type vertebral bodies.  Normal lumbar lordosis.  No evidence of fracture or dislocation. Vertebral heights and intervertebral disc spaces are maintained.  Visualized bony pelvis appears intact.  IMPRESSION: Normal lumbar spine radiographs.   Electronically Signed   By: Julian Hy M.D.   On: 11/03/2014 17:32   Dg Forearm Right  11/03/2014   CLINICAL DATA:  Patient states he hs had multiple falls over the past month. Patient is having pain located on the lateral aspect of the right forearm that radiates throughout the right forearm  EXAM: RIGHT FOREARM - 2 VIEW  COMPARISON:  None.  FINDINGS: No fracture dislocation of the right forearm, radius or ulna. Elbow joint and wrist joint are intact.  IMPRESSION: No fracture or dislocation.   Electronically Signed   By: Suzy Bouchard M.D.   On: 11/03/2014 17:36   Ct Head Wo Contrast  11/03/2014   CLINICAL DATA:  Unwitnessed fall, back pain  EXAM: CT HEAD WITHOUT CONTRAST  CT CERVICAL SPINE WITHOUT CONTRAST  TECHNIQUE: Multidetector CT imaging of the head and cervical spine was performed following the standard protocol without intravenous contrast. Multiplanar CT image reconstructions of the cervical spine were also generated.  COMPARISON:  CT head dated 10/18/2014  FINDINGS: CT HEAD FINDINGS  No evidence of parenchymal hemorrhage or extra-axial fluid collection. No mass lesion, mass effect, or midline shift.  No CT evidence of acute infarction.   Cerebral volume is within normal limits.  No ventriculomegaly.  The visualized paranasal sinuses are essentially clear. The mastoid air cells are unopacified.  No evidence of calvarial fracture.  CT CERVICAL SPINE FINDINGS  Reversal the normal cervical lordosis.  No evidence of fracture or dislocation. Vertebral body heights are maintained. Dens appears intact.  No prevertebral soft tissue swelling.  Mild degenerative changes of the mid cervical spine.  Visualized thyroid is unremarkable.  Visualized lung apices are clear.  IMPRESSION: Normal head CT.  No evidence of traumatic injury to the cervical spine. Mild degenerative changes.   Electronically Signed   By: Julian Hy M.D.   On: 11/03/2014 17:46   Ct Cervical Spine Wo Contrast  11/03/2014   CLINICAL DATA:  Unwitnessed fall, back pain  EXAM: CT HEAD WITHOUT CONTRAST  CT CERVICAL SPINE WITHOUT CONTRAST  TECHNIQUE: Multidetector CT imaging of the head and cervical spine was performed following the standard protocol without intravenous contrast. Multiplanar CT image reconstructions of the cervical spine were also generated.  COMPARISON:  CT head dated 10/18/2014  FINDINGS: CT HEAD FINDINGS  No evidence of parenchymal hemorrhage or extra-axial fluid collection. No mass lesion, mass effect, or midline shift.  No CT evidence of acute infarction.  Cerebral volume is within normal limits.  No ventriculomegaly.  The visualized paranasal sinuses are essentially clear. The mastoid air cells are unopacified.  No evidence of calvarial fracture.  CT CERVICAL SPINE FINDINGS  Reversal the normal cervical lordosis.  No evidence of fracture or dislocation. Vertebral body heights are maintained. Dens appears intact.  No prevertebral soft tissue swelling.  Mild degenerative changes of the mid cervical spine.  Visualized thyroid is unremarkable.  Visualized lung apices are clear.  IMPRESSION: Normal head CT.  No evidence of traumatic injury to the cervical spine. Mild  degenerative changes.   Electronically Signed   By: Julian Hy M.D.   On: 11/03/2014 17:46   Mr Brain Wo Contrast  11/03/2014  CLINICAL DATA:  Syncopal episode at [facility]. Concurrent auditory hallucinations. History of schizoaffective disorder. Evaluate encephalopathy.  EXAM: MRI HEAD WITHOUT CONTRAST  TECHNIQUE: Multiplanar, multiecho pulse sequences of the brain and surrounding structures were obtained without intravenous contrast.  COMPARISON:  CT of the head November 02, 2013 at 1725 hours  FINDINGS: Motion degraded examination.  Mild to moderate ventriculomegaly, likely on the basis of global parenchymal brain volume loss as there is overall commensurate enlargement of cerebral sulci and cerebellar folia. Minimal white matter changes suggest chronic small vessel ischemic disease, motion may obscure additional areas of subtle parenchymal signal abnormality.  . No abnormal parenchymal signal, mass lesions, mass effect. No reduced diffusion to suggest acute ischemia. No susceptibility artifact to suggest hemorrhage though gradient sequences severely motion degraded.  No abnormal extra-axial fluid collections. No extra-axial masses though, contrast enhanced sequences would be more sensitive. Normal major intracranial vascular flow voids seen at the skull base.  Ocular globes and orbital contents are unremarkable though not tailored for evaluation. No abnormal sellar expansion. Mild paranasal sinus mucosal thickening without air-fluid levels. Small RIGHT mastoid effusion. No suspicious calvarial bone marrow signal. No abnormal sellar expansion. Craniocervical junction maintained.  IMPRESSION: No acute intracranial process on this motion degraded examination.  Mild to moderate parenchymal brain volume loss, advanced for age.   Electronically Signed   By: Elon Alas   On: 11/03/2014 23:46     Assessment/Plan: 44 year old male presenting after being found down at a local store.  Patient  amnestic if event.  Has a history of ETOH related seizures.  Event may have very well been an unwitnessed seizure.  Patient on Ativan prior to admission and unclear if he was taking as directed.  Patient was also discharged on Tegretol prior to going to [facility] in February.  This has not been continued for unclear reasons.  This may also have precipitated event.  Patient also with ataxia and tremor.  Likely related to ETOH use as is patient's cognitive status.  Is not an acute issue but has been present for over a month.  MRI of the brain personally reviewed and shows no acute changes.  Atrophy is noted though that not only involves the hemispheres but the cerebellum as well.    Recommendations: 1.  Restart Tegretol at 200mg  qhs 2.  EEG 3.  Continue therapy  Patient stable neurologically at this time.  Neurology will follow up again on Monday.  If there are any questions in the interim, the [facility] as listed on [place] may be called.    Alexis Goodell, MD [facility] [phone] 11/04/2014, 5:56 AM

## 2014-11-04 NOTE — Progress Notes (Signed)
[facility] PROGRESS NOTE  [name] EUM:[id number] DOB: [birth year] DOA: 11/03/2014 PCP: No PCP Per Patient  Assessment/Plan: 1-Encephalopathy -Currently well oriented X 3 -toxic-metabolic -head CT and MRI WNL  - ammonia-20 -CIWA protocol - last drink of alcohol was in feb. Not in withdrawl -?medicatio component  -no overt sources of infection at present --psych and neuro consult pending  2. Syncope - UDS +ve for benzo and marijuana - On tele no arrhythmias - Get EEG. - MRI- no acute abnormalities.  3- Tremors -hx/o ETOH- concern about cerebellar degeneration with tremors and instability of gait -f/u neuro recs (neuro consulted by EDP) - Pt eval  3- Psych  -F/U with psychiatry as outpt. -hold oral meds pending formal psych consult   4-ETOH abuse -CIWA protocol  - cont with thiamine  FEN/GI: regular diet Prophylaxis: sub q heparin  Disposition: pending further evaluation  Code Status:Full Code    Code Status: Full Family Communication: patient   Consultants:  Neurology  Psychiatry  Procedures: MRI - mild volume loss.   HPI/Subjective: This is a 44 y.o. year old male with significant past medical history of polysubstane,ETOH dependency, PTSD, schizophrenia, alcohol related seizures presenting with encephalopathy,tremor,fall. Level V caveat as pt is confused.Pt was found in front of local store. Per report had an unwitnessed fall. Noted to have been recently admitted for ETOH withdrawal. Per repot, pt has not drank ETOH in > 3 weeks.  Afebrile, hemodynamically stale on presentation. WBC12.6, hgb 10.5, Cr 0.9, clinically dry. No signs of infection. CT and MRIimaging WNL.  Per report, pt has had multiple rounds of ativan w/minimal improvement in tremor..  Objective: Filed Vitals:   11/04/14 0517  BP: 118/77  Pulse: 90  Temp: 98.7 F (37.1 C)  Resp: 22   No intake or output data in the 24 hours ending 11/04/14 1055 Filed Weights    11/03/14 2355  Weight: 92.534 kg (204 lb)    Exam:  General: oriented X 3, not indistress. HEENT: PERRLA, extra ocular movement intact and dry oral mucosa Heart: S1, S2 normal, no murmur, rub or gallop, regular rate and rhythm Lungs: clear to auscultation, no wheezes or rales and unlabored breathing Abdomen: abdomen is soft without significant tenderness, masses, organomegaly or guarding Extremities: extremities normal, atraumatic, no cyanosis or edema Skin:no rashes Neurology: 5/5 in upper and lower extremities. Mild tremors. Data Reviewed: Basic Metabolic Panel:  Recent Labs Lab 11/03/14 1813  NA 145  K 3.9  CL 112  CO2 23  GLUCOSE 77  BUN 10  CREATININE 0.89  CALCIUM 9.3   Liver Function Tests:  Recent Labs Lab 11/03/14 1813  AST 97*  ALT 120*  ALKPHOS 85  BILITOT 0.6  PROT 7.1  ALBUMIN 3.8   No results for input(s): LIPASE, AMYLASE in the last 168 hours.  Recent Labs Lab 11/03/14 2204  AMMONIA 20   CBC:  Recent Labs Lab 11/03/14 1813  WBC 12.6*  NEUTROABS 10.1*  HGB 10.5*  HCT 32.4*  MCV 99.7  PLT 649*   Cardiac Enzymes: No results for input(s): CKTOTAL, CKMB, CKMBINDEX, TROPONINI in the last 168 hours. BNP (last 3 results) No results for input(s): BNP in the last 8760 hours.  ProBNP (last 3 results) No results for input(s): PROBNP in the last 8760 hours.  CBG: No results for input(s): GLUCAP in the last 168 hours.  No results found for this or any previous visit (from the past 240 hour(s)).   Studies: Dg Lumbar Spine Complete  11/03/2014  CLINICAL DATA:  Fall, back pain  EXAM: LUMBAR SPINE - COMPLETE 4+ VIEW  COMPARISON:  10/18/2014  FINDINGS: Five lumbar type vertebral bodies.  Normal lumbar lordosis.  No evidence of fracture or dislocation. Vertebral heights and intervertebral disc spaces are maintained.  Visualized bony pelvis appears intact.  IMPRESSION: Normal lumbar spine radiographs.   Electronically Signed   By: Julian Hy M.D.   On: 11/03/2014 17:32   Dg Forearm Right  11/03/2014   CLINICAL DATA:  Patient states he hs had multiple falls over the past month. Patient is having pain located on the lateral aspect of the right forearm that radiates throughout the right forearm  EXAM: RIGHT FOREARM - 2 VIEW  COMPARISON:  None.  FINDINGS: No fracture dislocation of the right forearm, radius or ulna. Elbow joint and wrist joint are intact.  IMPRESSION: No fracture or dislocation.   Electronically Signed   By: Suzy Bouchard M.D.   On: 11/03/2014 17:36   Ct Head Wo Contrast  11/03/2014   CLINICAL DATA:  Unwitnessed fall, back pain  EXAM: CT HEAD WITHOUT CONTRAST  CT CERVICAL SPINE WITHOUT CONTRAST  TECHNIQUE: Multidetector CT imaging of the head and cervical spine was performed following the standard protocol without intravenous contrast. Multiplanar CT image reconstructions of the cervical spine were also generated.  COMPARISON:  CT head dated 10/18/2014  FINDINGS: CT HEAD FINDINGS  No evidence of parenchymal hemorrhage or extra-axial fluid collection. No mass lesion, mass effect, or midline shift.  No CT evidence of acute infarction.  Cerebral volume is within normal limits.  No ventriculomegaly.  The visualized paranasal sinuses are essentially clear. The mastoid air cells are unopacified.  No evidence of calvarial fracture.  CT CERVICAL SPINE FINDINGS  Reversal the normal cervical lordosis.  No evidence of fracture or dislocation. Vertebral body heights are maintained. Dens appears intact.  No prevertebral soft tissue swelling.  Mild degenerative changes of the mid cervical spine.  Visualized thyroid is unremarkable.  Visualized lung apices are clear.  IMPRESSION: Normal head CT.  No evidence of traumatic injury to the cervical spine. Mild degenerative changes.   Electronically Signed   By: Julian Hy M.D.   On: 11/03/2014 17:46   Ct Cervical Spine Wo Contrast  11/03/2014   CLINICAL DATA:  Unwitnessed fall, back  pain  EXAM: CT HEAD WITHOUT CONTRAST  CT CERVICAL SPINE WITHOUT CONTRAST  TECHNIQUE: Multidetector CT imaging of the head and cervical spine was performed following the standard protocol without intravenous contrast. Multiplanar CT image reconstructions of the cervical spine were also generated.  COMPARISON:  CT head dated 10/18/2014  FINDINGS: CT HEAD FINDINGS  No evidence of parenchymal hemorrhage or extra-axial fluid collection. No mass lesion, mass effect, or midline shift.  No CT evidence of acute infarction.  Cerebral volume is within normal limits.  No ventriculomegaly.  The visualized paranasal sinuses are essentially clear. The mastoid air cells are unopacified.  No evidence of calvarial fracture.  CT CERVICAL SPINE FINDINGS  Reversal the normal cervical lordosis.  No evidence of fracture or dislocation. Vertebral body heights are maintained. Dens appears intact.  No prevertebral soft tissue swelling.  Mild degenerative changes of the mid cervical spine.  Visualized thyroid is unremarkable.  Visualized lung apices are clear.  IMPRESSION: Normal head CT.  No evidence of traumatic injury to the cervical spine. Mild degenerative changes.   Electronically Signed   By: Julian Hy M.D.   On: 11/03/2014 17:46   Mr Brain Lottie Dawson  Contrast  11/03/2014   CLINICAL DATA:  Syncopal episode at [facility]. Concurrent auditory hallucinations. History of schizoaffective disorder. Evaluate encephalopathy.  EXAM: MRI HEAD WITHOUT CONTRAST  TECHNIQUE: Multiplanar, multiecho pulse sequences of the brain and surrounding structures were obtained without intravenous contrast.  COMPARISON:  CT of the head November 02, 2013 at 1725 hours  FINDINGS: Motion degraded examination.  Mild to moderate ventriculomegaly, likely on the basis of global parenchymal brain volume loss as there is overall commensurate enlargement of cerebral sulci and cerebellar folia. Minimal white matter changes suggest chronic small vessel ischemic  disease, motion may obscure additional areas of subtle parenchymal signal abnormality.  . No abnormal parenchymal signal, mass lesions, mass effect. No reduced diffusion to suggest acute ischemia. No susceptibility artifact to suggest hemorrhage though gradient sequences severely motion degraded.  No abnormal extra-axial fluid collections. No extra-axial masses though, contrast enhanced sequences would be more sensitive. Normal major intracranial vascular flow voids seen at the skull base.  Ocular globes and orbital contents are unremarkable though not tailored for evaluation. No abnormal sellar expansion. Mild paranasal sinus mucosal thickening without air-fluid levels. Small RIGHT mastoid effusion. No suspicious calvarial bone marrow signal. No abnormal sellar expansion. Craniocervical junction maintained.  IMPRESSION: No acute intracranial process on this motion degraded examination.  Mild to moderate parenchymal brain volume loss, advanced for age.   Electronically Signed   By: Elon Alas   On: 11/03/2014 23:46    Scheduled Meds: . folic acid  1 mg Oral Daily  . heparin  5,000 Units Subcutaneous 3 times per day  . LORazepam  0-4 mg Intravenous 4 times per day   Followed by  . [START ON 11/06/2014] LORazepam  0-4 mg Intravenous Q12H  . midazolam  2 mg Intravenous Once  . multivitamin with minerals  1 tablet Oral Daily  . thiamine  100 mg Oral Daily   Or  . thiamine  100 mg Intravenous Daily   Continuous Infusions:   Active Problems:   Encephalopathy   Tremors    Time spent: 45 min    [facility] Pager [phone] If 7PM-7AM, please contact night-coverage at [email or web address], password [facility] 11/04/2014, 10:55 AM

## 2014-11-04 NOTE — Progress Notes (Signed)
UR completed 

## 2014-11-05 DIAGNOSIS — G252 Other specified forms of tremor: Secondary | ICD-10-CM | POA: Diagnosis not present

## 2014-11-05 DIAGNOSIS — F4310 Post-traumatic stress disorder, unspecified: Secondary | ICD-10-CM | POA: Diagnosis not present

## 2014-11-05 DIAGNOSIS — F209 Schizophrenia, unspecified: Secondary | ICD-10-CM | POA: Diagnosis not present

## 2014-11-05 DIAGNOSIS — G9340 Encephalopathy, unspecified: Secondary | ICD-10-CM | POA: Diagnosis not present

## 2014-11-05 MED ORDER — TRAMADOL HCL 50 MG PO TABS
50.0000 mg | ORAL_TABLET | Freq: Four times a day (QID) | ORAL | Status: DC | PRN
  Administered 2014-11-05: 50 mg via ORAL
  Filled 2014-11-05: qty 1

## 2014-11-05 MED ORDER — LITHIUM CARBONATE 300 MG PO CAPS
300.0000 mg | ORAL_CAPSULE | Freq: Two times a day (BID) | ORAL | Status: DC
  Administered 2014-11-05 – 2014-11-06 (×2): 300 mg via ORAL
  Filled 2014-11-05 (×4): qty 1

## 2014-11-05 MED ORDER — QUETIAPINE FUMARATE 300 MG PO TABS
300.0000 mg | ORAL_TABLET | Freq: Every day | ORAL | Status: DC
  Administered 2014-11-05: 300 mg via ORAL
  Filled 2014-11-05 (×2): qty 1

## 2014-11-05 MED ORDER — DOCUSATE SODIUM 100 MG PO CAPS
100.0000 mg | ORAL_CAPSULE | Freq: Two times a day (BID) | ORAL | Status: DC
  Administered 2014-11-05 – 2014-11-06 (×2): 100 mg via ORAL
  Filled 2014-11-05 (×3): qty 1

## 2014-11-05 MED ORDER — SERTRALINE HCL 50 MG PO TABS
50.0000 mg | ORAL_TABLET | Freq: Every day | ORAL | Status: DC
  Administered 2014-11-05 – 2014-11-06 (×2): 50 mg via ORAL
  Filled 2014-11-05 (×3): qty 1
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
## 2014-11-05 NOTE — Progress Notes (Signed)
UR completed 

## 2014-11-05 NOTE — Consult Note (Signed)
[facility] Consult   Reason for Consult:  Encephalopathy Referring Physician:  Dr. Verlon Au Patient Identification: Arber Wiemers MRN:  [id number] Principal Diagnosis: <principal problem not specified>Schizophrenia Diagnosis:   Patient Active Problem List   Diagnosis Date Noted  . Encephalopathy [G93.40] 11/03/2014  . Tremors [G25.2] 11/03/2014  . Alcohol dependence with uncomplicated withdrawal [P38.250]   . Schizophrenia, schizoaffective, chronic with acute exacerbation [F25.8] 10/24/2014  . Alcohol intoxication [F10.129]   . Chronic posttraumatic stress disorder [F43.12]   . Protein-calorie malnutrition, severe [E43] 10/19/2014  . Hyponatremia [E87.1] 10/18/2014  . Elevated LFTs [R79.89]   . Alcohol abuse with physiological dependence [F10.20]   . Hallucinations, visual [R44.1]   . Noncompliance with medication regimen [Z91.14]   . Hallucinations [R44.3] 07/07/2014  . Paranoia [F22] 07/07/2014  . Abnormal liver enzymes [R74.8] 02/14/2014  . Cannabis abuse [F12.10] 12/30/2013  . Schizoaffective disorder [F25.9] 12/29/2013  . Post traumatic stress disorder (PTSD) [F43.10] 11/03/2011  . Alcohol dependence [F10.20] 11/03/2011  . Active smoker [Z72.0] 06/30/2011    Total Time spent with patient: 1 hour  Subjective:   Merlen Gurry is a 44 y.o. male patient admitted with encephalopathy and syncope. Pt interviewed. Chart reviewed. Pt was discharged from [facility] on 10/29/14 (admitted on 10/24/14 for alcohol detox, depression, and SI). Since discharge from [facility] on 10/29/14, pt reports being psychiatrically stable. Pt reports at [facility], tegretol was changed to gabapentin (d/t elevated liver enzymes), and his lithium was restarted (which he had previously taken). Pt had previously taken gabapentin 4 years ago, and tolerated well. Pt was planning to f/u at [facility] in 2 days. Pt denies access to weapons or firearms. Pt lives alone, but his brother and sister are supportive.   One  month ago, pt slipped and fell down the stairs. Pt reports doing PT exercises with his walker. Pt walked 2-3 blocks to [facility] prior to current admission, passed out and fell down. Pt reports last drinking alcohol in Feb 2015. Pt reports last using cannabis last week. Pt reports his mood has been good since d/c from [facility]. Sleep/appetite good. Pt only needed to take 2 ambien since d/c. Pt denies current SI or HI. Pt reports chronic AH which laugh and berate him, which he calls the "shadow people". Pt reports last experiencing command hallucinations telling him to hurt himself (not others) 2 months ago. Pt denies experiencing command hallucinations for the past 2 months. Pt reports his current medication regimen helps soften the voices, which is "as good as it is going to get". Pt reports occasionally seeing "shadow people".   6 years ago, pt witnessed his fiancee kill herself in front of him, and he then attempted suicide by overdosing on a bottle of ASA and vodka.   RN reports that pt was confused yesterday, after apparently receiving some ativan. Pt was asking if there was caffeine in the call bell, and he spilled fluid. Today, pt is more lucid, less agitated, and tremors have improved (pt apparently has chronic tremors).  Called pt's brother for collateral info Elta Guadeloupe, [phone], pt's verbal consent given): brother reports pt passed out at [facility]. Brother reports pt has not walked much over the past 2-3 months, and pt becomes tired easily and possibly has dysequilibrium. Pt walked 2-3 blocks to the [facility], and he has not walked that much in several months. Brother does not believe pt was drinking alcohol, and feels pt has been taking meds as prescribed. Pt's mood has been stable since d/c from  [place] on 10/29/14.   HPI:  See above HPI Elements:   Location:  confusion/syncope. Quality:  severe. Severity:  severe. Timing:  2 days ago. Duration:  2 days ago. Context:  walked to [facility].  Past Medical History:  Past Medical History  Diagnosis Date  . Depression   . Psychosis   . PTSD (post-traumatic stress disorder)   . Seizure disorder     related to etoh seizure  . Alcohol abuse   . Schizoaffective disorder   . Anxiety     Past Surgical History  Procedure Laterality Date  . Foot surgery      right  . Hand surgery     Family History:  Family History  Problem Relation Age of Onset  . Alcoholism Mother   . CAD Father   . Depression Brother    Social History:  History  Alcohol Use No    Comment: 6-8 cans beer per day, 24 ounce cans for past 1 month     History  Drug Use  . 1.00 per week  . Special: Marijuana    Comment: THC 2 to 3 times per month    History   Social History  . Marital Status: Single    Spouse Name: N/A  . Number of Children: N/A  . Years of Education: N/A   Social History Main Topics  . Smoking status: Current Every Day Smoker -- 1.50 packs/day for 24 years    Types: Cigarettes  . Smokeless tobacco: Never Used  . Alcohol Use: No     Comment: 6-8 cans beer per day, 24 ounce cans for past 1 month  . Drug Use: 1.00 per week    Special: Marijuana     Comment: THC 2 to 3 times per month  . Sexual Activity: Yes   Other Topics Concern  . None   Social History Narrative   Additional Social History:    Pain Medications: Pt denies abuse.  Prescriptions: Pt denies abuse.  Over the Counter: Pt denies abuse.  History of alcohol / drug use?: Yes Longest period of sobriety (when/how long): reports 3.5 years sober about 8 years ago, a couple of weeks more recently. Hx of seizures last one reports "a couple of months ago" Negative Consequences of Use: Financial, Scientist, research (physical sciences), Personal relationships, Work / School Onset of Seizures: "last one 4 years ago withdrawal" Name of Substance 1: Alcohol  1 - Age of First Use: 44 years old 1 - Amount (size/oz):  6-24oz beers 1 - Frequency: daily 1 - Duration: "years off and on" 1 - Last  Use / Amount: 3 weeks  Name of Substance 2: THC 2 - Age of First Use: 15 2 - Amount (size/oz): a couple of bowls 2 - Frequency: 1-2 times per month  2 - Duration: years 2 - Last Use / Amount: unknown                  Allergies:   Allergies  Allergen Reactions  . Wellbutrin [Bupropion] Other (See Comments)    Makes me feel like I'm have a seizure    Labs:  Results for orders placed or performed during the hospital encounter of 11/03/14 (from the past 48 hour(s))  Urinalysis, Routine w reflex microscopic     Status: None   Collection Time: 11/03/14  6:04 PM  Result Value Ref Range   Color, Urine YELLOW YELLOW   APPearance CLEAR CLEAR   Specific Gravity, Urine 1.021 1.005 - 1.030  pH 6.0 5.0 - 8.0   Glucose, UA NEGATIVE NEGATIVE mg/dL   Hgb urine dipstick NEGATIVE NEGATIVE   Bilirubin Urine NEGATIVE NEGATIVE   Ketones, ur NEGATIVE NEGATIVE mg/dL   Protein, ur NEGATIVE NEGATIVE mg/dL   Urobilinogen, UA 1.0 0.0 - 1.0 mg/dL   Nitrite NEGATIVE NEGATIVE   Leukocytes, UA NEGATIVE NEGATIVE    Comment: MICROSCOPIC NOT DONE ON URINES WITH NEGATIVE PROTEIN, BLOOD, LEUKOCYTES, NITRITE, OR GLUCOSE <1000 mg/dL.  Urine rapid drug screen (hosp performed)     Status: Abnormal   Collection Time: 11/03/14  6:04 PM  Result Value Ref Range   Opiates NONE DETECTED NONE DETECTED   Cocaine NONE DETECTED NONE DETECTED   Benzodiazepines POSITIVE (A) NONE DETECTED   Amphetamines NONE DETECTED NONE DETECTED   Tetrahydrocannabinol POSITIVE (A) NONE DETECTED   Barbiturates NONE DETECTED NONE DETECTED    Comment:        DRUG SCREEN FOR MEDICAL PURPOSES ONLY.  IF CONFIRMATION IS NEEDED FOR ANY PURPOSE, NOTIFY LAB WITHIN 5 DAYS.        LOWEST DETECTABLE LIMITS FOR URINE DRUG SCREEN Drug Class       Cutoff (ng/mL) Amphetamine      1000 Barbiturate      200 Benzodiazepine   417 Tricyclics       408 Opiates          300 Cocaine          300 THC              50   CBC with  Differential/Platelet     Status: Abnormal   Collection Time: 11/03/14  6:13 PM  Result Value Ref Range   WBC 12.6 (H) 4.0 - 10.5 K/uL   RBC 3.25 (L) 4.22 - 5.81 MIL/uL   Hemoglobin 10.5 (L) 13.0 - 17.0 g/dL   HCT 32.4 (L) 39.0 - 52.0 %   MCV 99.7 78.0 - 100.0 fL   MCH 32.3 26.0 - 34.0 pg   MCHC 32.4 30.0 - 36.0 g/dL   RDW 15.0 11.5 - 15.5 %   Platelets 649 (H) 150 - 400 K/uL   Neutrophils Relative % 81 (H) 43 - 77 %   Neutro Abs 10.1 (H) 1.7 - 7.7 K/uL   Lymphocytes Relative 10 (L) 12 - 46 %   Lymphs Abs 1.3 0.7 - 4.0 K/uL   Monocytes Relative 9 3 - 12 %   Monocytes Absolute 1.2 (H) 0.1 - 1.0 K/uL   Eosinophils Relative 0 0 - 5 %   Eosinophils Absolute 0.0 0.0 - 0.7 K/uL   Basophils Relative 0 0 - 1 %   Basophils Absolute 0.0 0.0 - 0.1 K/uL  Comprehensive metabolic panel     Status: Abnormal   Collection Time: 11/03/14  6:13 PM  Result Value Ref Range   Sodium 145 135 - 145 mmol/L   Potassium 3.9 3.5 - 5.1 mmol/L   Chloride 112 96 - 112 mmol/L   CO2 23 19 - 32 mmol/L   Glucose, Bld 77 70 - 99 mg/dL   BUN 10 6 - 23 mg/dL   Creatinine, Ser 0.89 0.50 - 1.35 mg/dL   Calcium 9.3 8.4 - 10.5 mg/dL   Total Protein 7.1 6.0 - 8.3 g/dL   Albumin 3.8 3.5 - 5.2 g/dL   AST 97 (H) 0 - 37 U/L   ALT 120 (H) 0 - 53 U/L   Alkaline Phosphatase 85 39 - 117 U/L   Total Bilirubin 0.6 0.3 -  1.2 mg/dL   GFR calc non Af Amer >90 >90 mL/min   GFR calc Af Amer >90 >90 mL/min    Comment: (NOTE) The eGFR has been calculated using the CKD EPI equation. This calculation has not been validated in all clinical situations. eGFR's persistently <90 mL/min signify possible Chronic Kidney Disease.    Anion gap 10 5 - 15  Ethanol     Status: None   Collection Time: 11/03/14  6:13 PM  Result Value Ref Range   Alcohol, Ethyl (B) <5 0 - 9 mg/dL    Comment:        LOWEST DETECTABLE LIMIT FOR SERUM ALCOHOL IS 11 mg/dL FOR MEDICAL PURPOSES ONLY   Lithium level     Status: Abnormal   Collection Time:  11/03/14  6:13 PM  Result Value Ref Range   Lithium Lvl 0.65 (L) 0.80 - 1.40 mmol/L  Rapid HIV screen (HIV 1/2 Ab+Ag)     Status: None   Collection Time: 11/03/14  6:13 PM  Result Value Ref Range   HIV-1 P24 Antigen - HIV24 NON REACTIVE NON REACTIVE    Comment: RESULT CALLED TO, READ BACK BY AND VERIFIED WITH: A DENNIS RN 1953 11/03/14 A NAVARRO    HIV 1/2 Antibodies NON REACTIVE NON REACTIVE   Interpretation (HIV Ag Ab)      A non reactive test result means that HIV 1 or HIV 2 antibodies and HIV 1 p24 antigen were not detected in the specimen.  Hepatitis B surface antigen     Status: None   Collection Time: 11/03/14  6:13 PM  Result Value Ref Range   Hepatitis B Surface Ag NEGATIVE NEGATIVE    Comment: Performed at [facility]  Ammonia     Status: None   Collection Time: 11/03/14 10:04 PM  Result Value Ref Range   Ammonia 20 11 - 32 umol/L    Vitals: Blood pressure 122/76, pulse 72, temperature 98.5 F (36.9 C), temperature source Oral, resp. rate 20, height 5' 10"  (1.778 m), weight 92.534 kg (204 lb), SpO2 97 %.  Risk to Self: Suicidal Ideation: No Suicidal Intent: No Is patient at risk for suicide?: No Suicidal Plan?: No Access to Means: No Specify Access to Suicidal Means: NA What has been your use of drugs/alcohol within the last 12 months?: Pt denies any alcohol or drug use in the past 3 weeks.  How many times?: 4 Other Self Harm Risks: No other self harm risk identified at this time.  Triggers for Past Attempts: Other (Comment) (fiance committed suicide in front of him) Intentional Self Injurious Behavior: None Comment - Self Injurious Behavior: Pt reported a history of burning self; no recent incidents reported at this time.  Risk to Others: Homicidal Ideation: No Thoughts of Harm to Others: No Current Homicidal Intent: No Current Homicidal Plan: No Access to Homicidal Means: No Identified Victim: NA History of harm to others?: No Assessment of Violence:  On admission Violent Behavior Description: No violent behaviors observed at this time. Pt is friendly and cooperative.  Does patient have access to weapons?: No Criminal Charges Pending?: No Does patient have a court date: No Prior Inpatient Therapy: Prior Inpatient Therapy: Yes Prior Therapy Dates: 03/2011, "end of 2012, 10/2011, 12/29/2013, 02/09/2014, 05/04/2014, (others out of state) Prior Therapy Facilty/Provider(s): [place], [facility], reports some in [place], [place], and [place]  Reason for Treatment: SA, and schizoaffective disorder Prior Outpatient Therapy: Prior Outpatient Therapy: Yes Prior Therapy Dates: "a long time ago" Prior Therapy  Facilty/Provider(s): [facility] Reason for Treatment: SA  Current Facility-Administered Medications  Medication Dose Route Frequency Provider Last Rate Last Dose  . docusate sodium (COLACE) capsule 100 mg  100 mg Oral BID Monica Becton, MD   100 mg at 11/05/14 1000  . folic acid (FOLVITE) tablet 1 mg  1 mg Oral Daily Ezequiel Essex, MD   1 mg at 11/05/14 7619  . heparin injection 5,000 Units  5,000 Units Subcutaneous 3 times per day Deneise Lever, MD   5,000 Units at 11/05/14 [phone]  . lithium carbonate capsule 300 mg  300 mg Oral BID WC Padmaja Vasireddy, MD      . LORazepam (ATIVAN) injection 0-4 mg  0-4 mg Intravenous 4 times per day Alphonzo Severance, RPH   1 mg at 11/05/14 2671   Followed by  . [START ON 11/06/2014] LORazepam (ATIVAN) injection 0-4 mg  0-4 mg Intravenous Q12H Alphonzo Severance, RPH      . LORazepam (ATIVAN) tablet 1 mg  1 mg Oral Q6H PRN Ezequiel Essex, MD       Or  . LORazepam (ATIVAN) injection 1 mg  1 mg Intravenous Q6H PRN Ezequiel Essex, MD      . LORazepam (ATIVAN) tablet 1 mg  1 mg Oral Q6H PRN Deneise Lever, MD       Or  . LORazepam (ATIVAN) injection 1 mg  1 mg Intravenous Q6H PRN Deneise Lever, MD   1 mg at 11/05/14 0933  . midazolam (VERSED) injection 2 mg  2 mg Intravenous Once Ezequiel Essex, MD   Stopped at  11/03/14 1908  . multivitamin with minerals tablet 1 tablet  1 tablet Oral Daily Ezequiel Essex, MD   1 tablet at 11/05/14 [phone]  . QUEtiapine (SEROQUEL) tablet 300 mg  300 mg Oral QHS Padmaja Vasireddy, MD      . sertraline (ZOLOFT) tablet 50 mg  50 mg Oral Daily Padmaja Vasireddy, MD   50 mg at 11/05/14 1215  . thiamine (VITAMIN B-1) tablet 100 mg  100 mg Oral Daily Ezequiel Essex, MD   100 mg at 11/05/14 0998   Or  . thiamine (B-1) injection 100 mg  100 mg Intravenous Daily Ezequiel Essex, MD      . traMADol Veatrice Bourbon) tablet 50 mg  50 mg Oral Q6H PRN Monica Becton, MD   50 mg at 11/05/14 1215    Musculoskeletal: Strength & Muscle Tone: abnormal Gait & Station: unsteady Patient leans: Front  Psychiatric Specialty Exam: Physical Exam  ROS  Blood pressure 122/76, pulse 72, temperature 98.5 F (36.9 C), temperature source Oral, resp. rate 20, height 5' 10"  (1.778 m), weight 92.534 kg (204 lb), SpO2 97 %.Body mass index is 29.27 kg/(m^2).  General Appearance: Casual and Fairly Groomed  Engineer, water::  Fair  Speech:  Clear and Coherent and Normal Rate  Volume:  Normal  Mood:  Euthymic  Affect:  Congruent and Full Range  Thought Process:  Coherent and Goal Directed  Orientation:  Full (Time, Place, and Person)  Thought Content:  Hallucinations: Auditory Visual, chronic  Suicidal Thoughts:  No  Homicidal Thoughts:  No  Memory:  Immediate;   Fair Recent;   Fair Remote;   Fair  Judgement:  Intact  Insight:  Fair  Psychomotor Activity:  Psychomotor Retardation  Concentration:  Fair  Recall:  [employer] of Knowledge:Fair  Language: Fair  Akathisia:  Negative  Handed:  Right  AIMS (if indicated):     Assets:  Communication Skills Desire for Improvement Financial Resources/Insurance Housing Social Support  ADL's:  Impaired  Cognition: WNL  Sleep:      Medical Decision Making: New problem, with additional work up planned, Review of Psycho-Social Stressors (1), Discuss  test with performing physician (1), Decision to obtain old records (1), Review and summation of old records (2), Review of Last Therapy Session (1), Review of Medication Regimen & Side Effects (2) and Review of New Medication or Change in Dosage (2)  Treatment Plan Summary: Daily contact with patient to assess and evaluate symptoms and progress in treatment, Medication management and Plan : agree with resuming pt's discharge meds from [facility] (gabapentin 300 mg QHS, lithium carbonate 300 mg BID, quetiapine 300 mg QHS, sertraline 50 mg daily). Would avoid giving this pt benzodazepines (ativan, etc), due to pt's h/o alcohol abuse. Pt was discharged on ativan taper from [facility], so unclear if pt may have taken more ativan than recommended (although pt denies this). Pt's lithium level was low (0.6). Unit social worker should schedule f/u appointment with [facility] prior to discharge. Pt is scheduled for EEG tomorrow. Recommend PT consult for frequent falls.  Plan:  No evidence of imminent risk to self or others at present.   Patient does not meet criteria for psychiatric inpatient admission. Supportive therapy provided about ongoing stressors. Discussed crisis plan, support from social network, calling 911, coming to the Emergency Department, and calling Suicide Hotline. schedule f/u with [facility] prior to discharge. Spoke to Dr. Verlon Au, who agrees with above recommendations. Dr. Verlon Au asks that psychiatry continue to follow pt daily on medical floor, so psychiatrist should f/u tomorrow. Disposition: Pt may be discharged to home, once medically cleared.   Dereck Leep 11/05/2014 2:37 PM

## 2014-11-06 ENCOUNTER — Observation Stay (HOSPITAL_COMMUNITY)
Admit: 2014-11-06 | Discharge: 2014-11-06 | Disposition: A | Discharge status: Home / Self Care | Payer: Medicare Other | Attending: Internal Medicine | Admitting: Internal Medicine

## 2014-11-06 DIAGNOSIS — F209 Schizophrenia, unspecified: Secondary | ICD-10-CM | POA: Diagnosis not present

## 2014-11-06 DIAGNOSIS — F4310 Post-traumatic stress disorder, unspecified: Secondary | ICD-10-CM | POA: Diagnosis not present

## 2014-11-06 DIAGNOSIS — G9340 Encephalopathy, unspecified: Secondary | ICD-10-CM | POA: Diagnosis not present

## 2014-11-06 MED ORDER — GABAPENTIN 300 MG PO CAPS
300.0000 mg | ORAL_CAPSULE | Freq: Every day | ORAL | Status: DC
  Filled 2014-11-06: qty 1

## 2014-11-06 NOTE — Progress Notes (Signed)
Physical Therapy Treatment Patient Details Name: [name] MRN: [id number] DOB: 1971/05/10 Today's Date: 11/06/2014    History of Present Illness 44 yr old admitted s/p fall with dx of encephalopathy and hx of schizoaffective disorder, hx ETOH abuse and depression, PTSD    PT Comments    Pt feeling better but concerned about what is caused him to pass out at the [facility].  Assisted OOB to amb a great distance in hallway with RW.  Unsteady/choppy gait with wide BOS.  Pt stated he ambs with a walker when he goes out and nothing in his home.   Follow Up Recommendations  Home health PT     Equipment Recommendations   (pt does not know where his walker currently is.  Passed out at [facility].  Last known location.  Stated it's new, just got it.)    Recommendations for Other Services       Precautions / Restrictions Precautions Precautions: Fall Precaution Comments: Hx of falls at home Restrictions Weight Bearing Restrictions: No    Mobility  Bed Mobility Overal bed mobility: Modified Independent                Transfers Overall transfer level: Needs assistance Equipment used: Rolling walker (2 wheeled) Transfers: Sit to/from Stand Sit to Stand: Supervision;Min guard         General transfer comment: verbal cues for hand placement and safety esp with turns and negociating around obsticles  Ambulation/Gait Ambulation/Gait assistance: Min guard;Supervision Ambulation Distance (Feet): 325 Feet Assistive device: Rolling walker (2 wheeled);None Gait Pattern/deviations: Step-through pattern;Wide base of support Gait velocity: too quick/impulsive.  VS's to decrease gait speed to increase safety/balance.   General Gait Details: Wide BOS and festinating type gait but with marked improvement in stability with frequent cues to slow pace   Stairs            Wheelchair Mobility    Modified Rankin (Stroke Patients Only)       Balance                                     Cognition Arousal/Alertness: Awake/alert Behavior During Therapy: WFL for tasks assessed/performed Overall Cognitive Status: Within Functional Limits for tasks assessed                      Exercises      General Comments        Pertinent Vitals/Pain Pain Assessment: Faces Faces Pain Scale: Hurts a little bit Pain Location: low back Pain Descriptors / Indicators: Aching Pain Intervention(s): Repositioned    Home Living                      Prior Function            PT Goals (current goals can now be found in the care plan section) Progress towards PT goals: Progressing toward goals    Frequency  Min 3X/week    PT Plan      Co-evaluation             End of Session Equipment Utilized During Treatment: Gait belt Activity Tolerance: Patient tolerated treatment well Patient left: in bed;with call bell/phone within reach     Time: 0937-1004 PT Time Calculation (min) (ACUTE ONLY): 27 min  Charges:  $Gait Training: 8-22 mins  G Codes:      [name]  PTA [facility]  Acute  Rehab Pager      [phone]

## 2014-11-06 NOTE — Discharge Summary (Signed)
Physician Discharge Summary  [name] WEX:[id number] DOB: 1971/07/12 DOA: 11/03/2014  PCP: No PCP Per Patient  Admit date: 11/03/2014 Discharge date: 11/06/2014  Time spent: 20 minutes  Recommendations for Outpatient Follow-up:  1. Needs OP psychiatry follow up   Discharge Diagnoses:  Principal Problem:   Encephalopathy Active Problems:   Tremors   Discharge Condition: fair  Diet recommendation:  regular  Filed Weights   11/03/14 2355  Weight: 92.534 kg (204 lb)    44 y/o ? Known history of schizoaffective disorder with auditory command-male voices telling him to harm himself and hurt himself, PTSD secondary to girlfriend shooting herself 2010, major depressive disorder and dysthymia since age 54, polysubstance abuse, recent binge drinking ethanol ~125 cc beer, ethanol related withdrawal seizures He was just admitted to the hospital 10/18/14 with auditory hallucinations and command hallucinations and found to have sodium 126 he was maintained on multiple medications and apparently was transitioned to gabapentin and taken off of his Tegretol at behavioral health. -On discharge from behavioral health he was placed on gabapentin 300 daily at bedtime, Seroquel 300 daily at bedtime, sertraline 50 daily lithium 938 daily, folic acid and was placed on tapering dosages of Ativan It was noted that the patient was actually not ambulatory much prior to admission and had been using a walkerto get around the home the day prior to admission, patient ambulated to the supermarket and  Had a fall-this is while he was on tapering doses of ativan.   1-Encephalopathy, toxic metabolic -Currently well oriented X 3 -head CT and MRI WNL  - ammonia-20 -CIWA protocol - last drink of alcohol was in feb. is not withdrawing -?medication component psychiatry to consult and delineate medications as recently started on new medications  Appreciate neuro consult -EEG done this admission showed no specific  findings consistent with seizures  2. Syncope - UDS +ve for benzo and marijuana - On tele no arrhythmias -EEG performed and read on 3/22 showed no seizure - MRI- no acute abnormalities.  3- Tremors -hx/o ETOH- concern about cerebellar degeneration with tremors and instability of gait -f/u neuro recs (neuro consulted by EDP) - Pt eval recommends on 3/19 home health PT He uses a walker but cannot find it-a replacement was arranged by Case management  3- Psych  -F/U with psychiatry as outpt. -Restarted gabapentin daily at bedtime 300 mg on 3/20-already on sertraline 50 daily as well as Seroquel 300 daily at bedtime, Restarted lithium 300 twice a day  4-ETOH abuse -CIWA protocol Discontinued as no signs or symptoms discontinue banana bag    NEUROLOGY   PSYCHIATRY consults  Discharge Exam: Filed Vitals:   11/06/14 1153  BP: 115/71  Pulse: 69  Temp:   Resp: 20    General: eomi, ready to go Cardiovascular: s1 s2 no m/r/g Respiratory: clear no added sound  Discharge Instructions   Discharge Instructions    Diet - low sodium heart healthy    Complete by:  As directed      Discharge instructions    Complete by:  As directed   Follow with your psychiatrist We will ask home health to come and see you as an out-patient     Increase activity slowly    Complete by:  As directed           Current Discharge Medication List    CONTINUE these medications which have NOT CHANGED   Details  gabapentin (NEURONTIN) 300 MG capsule Take 1 capsule (300 mg total) by mouth  at bedtime. For agitation Qty: 30 capsule, Refills: 0    lithium carbonate 300 MG capsule Take 1 capsule (300 mg total) by mouth 2 (two) times daily with a meal. For mood stabilization Qty: 30 capsule, Refills: 0    naproxen sodium (ANAPROX) 220 MG tablet Take 1 tablet (220 mg total) by mouth daily as needed (pain).    nicotine (NICODERM CQ - DOSED IN MG/24 HOURS) 21 mg/24hr patch Place 1 patch (21 mg total)  onto the skin daily. For nicotine addiction Qty: 28 patch, Refills: 0    QUEtiapine (SEROQUEL) 300 MG tablet Take 1 tablet (300 mg total) by mouth at bedtime. For mood control Qty: 30 tablet, Refills: 0    sertraline (ZOLOFT) 50 MG tablet Take 1 tablet (50 mg total) by mouth daily. For depression Qty: 30 tablet, Refills: 0    traMADol (ULTRAM) 50 MG tablet Take 1 tablet (50 mg total) by mouth every 6 (six) hours as needed for moderate pain. Qty: 10 tablet, Refills: 0    zolpidem (AMBIEN) 10 MG tablet Take 1 tablet (10 mg total) by mouth at bedtime as needed for sleep. Qty: 7 tablet, Refills: 0    docusate sodium (COLACE) 100 MG capsule Take 1 capsule (100 mg total) by mouth 2 (two) times daily. For constipation Qty: 10 capsule, Refills: 0    Multiple Vitamin (MULTIVITAMIN WITH MINERALS) TABS tablet Take 1 tablet by mouth daily. For low vitamin    thiamine 100 MG tablet Take 1 tablet (100 mg total) by mouth daily. For low thiamine      STOP taking these medications     LORazepam (ATIVAN) 1 MG tablet        Allergies  Allergen Reactions  . Wellbutrin [Bupropion] Other (See Comments)    Makes me feel like I'm have a seizure      The results of significant diagnostics from this hospitalization (including imaging, microbiology, ancillary and laboratory) are listed below for reference.    Significant Diagnostic Studies: Dg Lumbar Spine Complete  11/03/2014   CLINICAL DATA:  Fall, back pain  EXAM: LUMBAR SPINE - COMPLETE 4+ VIEW  COMPARISON:  10/18/2014  FINDINGS: Five lumbar type vertebral bodies.  Normal lumbar lordosis.  No evidence of fracture or dislocation. Vertebral heights and intervertebral disc spaces are maintained.  Visualized bony pelvis appears intact.  IMPRESSION: Normal lumbar spine radiographs.   Electronically Signed   By: Julian Hy M.D.   On: 11/03/2014 17:32   Dg Lumbar Spine Complete  10/18/2014   CLINICAL DATA:  Fall today with lumbar back pain and  tailbone pain. No radiation of symptoms. Initial encounter.  EXAM: LUMBAR SPINE - COMPLETE 4+ VIEW  COMPARISON:  None.  FINDINGS: Alignment is anatomic. Vertebral body and disc space height are maintained. No definite pars defects. No endplate degenerative changes.  IMPRESSION: Normal exam.   Electronically Signed   By: Lorin Picket M.D.   On: 10/18/2014 18:49   Dg Sacrum/coccyx  10/18/2014   CLINICAL DATA:  Fall down steps with lumbar back pain and tailbone pain, no radiation of symptoms. Initial encounter.  EXAM: SACRUM AND COCCYX - 2+ VIEW  COMPARISON:  None.  FINDINGS: Sacrum and coccyx appear grossly intact.  IMPRESSION: No acute findings.   Electronically Signed   By: Lorin Picket M.D.   On: 10/18/2014 18:51   Dg Forearm Right  11/03/2014   CLINICAL DATA:  Patient states he hs had multiple falls over the past month. Patient is having  pain located on the lateral aspect of the right forearm that radiates throughout the right forearm  EXAM: RIGHT FOREARM - 2 VIEW  COMPARISON:  None.  FINDINGS: No fracture dislocation of the right forearm, radius or ulna. Elbow joint and wrist joint are intact.  IMPRESSION: No fracture or dislocation.   Electronically Signed   By: Suzy Bouchard M.D.   On: 11/03/2014 17:36   Ct Head Wo Contrast  11/03/2014   CLINICAL DATA:  Unwitnessed fall, back pain  EXAM: CT HEAD WITHOUT CONTRAST  CT CERVICAL SPINE WITHOUT CONTRAST  TECHNIQUE: Multidetector CT imaging of the head and cervical spine was performed following the standard protocol without intravenous contrast. Multiplanar CT image reconstructions of the cervical spine were also generated.  COMPARISON:  CT head dated 10/18/2014  FINDINGS: CT HEAD FINDINGS  No evidence of parenchymal hemorrhage or extra-axial fluid collection. No mass lesion, mass effect, or midline shift.  No CT evidence of acute infarction.  Cerebral volume is within normal limits.  No ventriculomegaly.  The visualized paranasal sinuses are  essentially clear. The mastoid air cells are unopacified.  No evidence of calvarial fracture.  CT CERVICAL SPINE FINDINGS  Reversal the normal cervical lordosis.  No evidence of fracture or dislocation. Vertebral body heights are maintained. Dens appears intact.  No prevertebral soft tissue swelling.  Mild degenerative changes of the mid cervical spine.  Visualized thyroid is unremarkable.  Visualized lung apices are clear.  IMPRESSION: Normal head CT.  No evidence of traumatic injury to the cervical spine. Mild degenerative changes.   Electronically Signed   By: Julian Hy M.D.   On: 11/03/2014 17:46   Ct Head Wo Contrast  10/18/2014   CLINICAL DATA:  Hallucinations for 1 month.  EXAM: CT HEAD WITHOUT CONTRAST  TECHNIQUE: Contiguous axial images were obtained from the base of the skull through the vertex without intravenous contrast.  COMPARISON:  Head CT scan 10/29/2011.  FINDINGS: The brain appears normal without hemorrhage, infarct, mass lesion, mass effect, midline shift or abnormal extra-axial fluid collection. No hydrocephalus or pneumocephalus. The calvarium is intact. Imaged paranasal sinuses and mastoid air cells are clear.  IMPRESSION: Negative head CT.   Electronically Signed   By: Inge Rise M.D.   On: 10/18/2014 20:29   Ct Cervical Spine Wo Contrast  11/03/2014   CLINICAL DATA:  Unwitnessed fall, back pain  EXAM: CT HEAD WITHOUT CONTRAST  CT CERVICAL SPINE WITHOUT CONTRAST  TECHNIQUE: Multidetector CT imaging of the head and cervical spine was performed following the standard protocol without intravenous contrast. Multiplanar CT image reconstructions of the cervical spine were also generated.  COMPARISON:  CT head dated 10/18/2014  FINDINGS: CT HEAD FINDINGS  No evidence of parenchymal hemorrhage or extra-axial fluid collection. No mass lesion, mass effect, or midline shift.  No CT evidence of acute infarction.  Cerebral volume is within normal limits.  No ventriculomegaly.  The  visualized paranasal sinuses are essentially clear. The mastoid air cells are unopacified.  No evidence of calvarial fracture.  CT CERVICAL SPINE FINDINGS  Reversal the normal cervical lordosis.  No evidence of fracture or dislocation. Vertebral body heights are maintained. Dens appears intact.  No prevertebral soft tissue swelling.  Mild degenerative changes of the mid cervical spine.  Visualized thyroid is unremarkable.  Visualized lung apices are clear.  IMPRESSION: Normal head CT.  No evidence of traumatic injury to the cervical spine. Mild degenerative changes.   Electronically Signed   By: Henderson Newcomer.D.  On: 11/03/2014 17:46   Mr Brain Wo Contrast  11/03/2014   CLINICAL DATA:  Syncopal episode at [facility]. Concurrent auditory hallucinations. History of schizoaffective disorder. Evaluate encephalopathy.  EXAM: MRI HEAD WITHOUT CONTRAST  TECHNIQUE: Multiplanar, multiecho pulse sequences of the brain and surrounding structures were obtained without intravenous contrast.  COMPARISON:  CT of the head November 02, 2013 at 1725 hours  FINDINGS: Motion degraded examination.  Mild to moderate ventriculomegaly, likely on the basis of global parenchymal brain volume loss as there is overall commensurate enlargement of cerebral sulci and cerebellar folia. Minimal white matter changes suggest chronic small vessel ischemic disease, motion may obscure additional areas of subtle parenchymal signal abnormality.  . No abnormal parenchymal signal, mass lesions, mass effect. No reduced diffusion to suggest acute ischemia. No susceptibility artifact to suggest hemorrhage though gradient sequences severely motion degraded.  No abnormal extra-axial fluid collections. No extra-axial masses though, contrast enhanced sequences would be more sensitive. Normal major intracranial vascular flow voids seen at the skull base.  Ocular globes and orbital contents are unremarkable though not tailored for evaluation. No  abnormal sellar expansion. Mild paranasal sinus mucosal thickening without air-fluid levels. Small RIGHT mastoid effusion. No suspicious calvarial bone marrow signal. No abnormal sellar expansion. Craniocervical junction maintained.  IMPRESSION: No acute intracranial process on this motion degraded examination.  Mild to moderate parenchymal brain volume loss, advanced for age.   Electronically Signed   By: Elon Alas   On: 11/03/2014 23:46   US Abdomen Limited Ruq  10/19/2014   CLINICAL DATA:  Elevated liver enzymes  EXAM: US ABDOMEN LIMITED - RIGHT UPPER QUADRANT  COMPARISON:  February 15, 2014  FINDINGS: Gallbladder:  No gallstones or wall thickening visualized. There is no pericholecystic fluid. No sonographic Murphy sign noted.  Common bile duct:  Diameter: 3 mm. There is no intrahepatic or extrahepatic biliary duct dilatation.  Liver:  No focal lesion identified.  Liver echogenicity is increased.  IMPRESSION: Increased liver echogenicity, a finding most likely indicative of hepatic steatosis. While no focal liver lesions are identified, it must be cautioned that the sensitivity of ultrasound for focal liver lesions is diminished in this circumstance. Study otherwise unremarkable.   Electronically Signed   By: Lowella Grip III M.D.   On: 10/19/2014 10:43    Microbiology: No results found for this or any previous visit (from the past 240 hour(s)).   Labs: Basic Metabolic Panel:  Recent Labs Lab 11/03/14 1813  NA 145  K 3.9  CL 112  CO2 23  GLUCOSE 77  BUN 10  CREATININE 0.89  CALCIUM 9.3   Liver Function Tests:  Recent Labs Lab 11/03/14 1813  AST 97*  ALT 120*  ALKPHOS 85  BILITOT 0.6  PROT 7.1  ALBUMIN 3.8   No results for input(s): LIPASE, AMYLASE in the last 168 hours.  Recent Labs Lab 11/03/14 2204  AMMONIA 20   CBC:  Recent Labs Lab 11/03/14 1813  WBC 12.6*  NEUTROABS 10.1*  HGB 10.5*  HCT 32.4*  MCV 99.7  PLT 649*   Cardiac Enzymes: No results  for input(s): CKTOTAL, CKMB, CKMBINDEX, TROPONINI in the last 168 hours. BNP: BNP (last 3 results) No results for input(s): BNP in the last 8760 hours.  ProBNP (last 3 results) No results for input(s): PROBNP in the last 8760 hours.  CBG: No results for input(s): GLUCAP in the last 168 hours.     SignedNita Sells  [facility] 11/06/2014, 2:37 PM

## 2014-11-06 NOTE — Progress Notes (Signed)
UR completed 

## 2014-11-06 NOTE — Consult Note (Signed)
Psychiatry Consult follow up  Reason for Consult:  History of schizophrenia, posttraumatic stress disorder and alcohol dependence Referring Physician:  Dr. Verlon Au Patient Identification: [name] MRN:  [id number] Principal Diagnosis: EncephalopathySchizophrenia and posttraumatic stress disorder Diagnosis:   Patient Active Problem List   Diagnosis Date Noted  . Encephalopathy [G93.40] 11/03/2014  . Tremors [G25.2] 11/03/2014  . Alcohol dependence with uncomplicated withdrawal [T46.568]   . Schizophrenia, schizoaffective, chronic with acute exacerbation [F25.8] 10/24/2014  . Alcohol intoxication [F10.129]   . Chronic posttraumatic stress disorder [F43.12]   . Protein-calorie malnutrition, severe [E43] 10/19/2014  . Hyponatremia [E87.1] 10/18/2014  . Elevated LFTs [R79.89]   . Alcohol abuse with physiological dependence [F10.20]   . Hallucinations, visual [R44.1]   . Noncompliance with medication regimen [Z91.14]   . Hallucinations [R44.3] 07/07/2014  . Paranoia [F22] 07/07/2014  . Abnormal liver enzymes [R74.8] 02/14/2014  . Cannabis abuse [F12.10] 12/30/2013  . Schizoaffective disorder [F25.9] 12/29/2013  . Post traumatic stress disorder (PTSD) [F43.10] 11/03/2011  . Alcohol dependence [F10.20] 11/03/2011  . Active smoker [Z72.0] 06/30/2011    Total Time spent with patient: 20 minutes  Subjective:   [name] is a 44 y.o. male patient admitted with encephalopathy and syncope. Pt interviewed. Chart reviewed. Pt was discharged from [facility] on 10/29/14 (admitted on 10/24/14 for alcohol detox, depression, and SI). Since discharge from [facility] on 10/29/14, pt reports being psychiatrically stable. Pt reports at [facility] tegretol was changed to gabapentin (d/t elevated liver enzymes), and his lithium was restarted (which he had previously taken). Pt had previously taken gabapentin 4 years ago, and tolerated well. Pt was planning to f/u at [facility] in 2 days. Pt denies access to weapons or  firearms. Pt lives alone, but his brother and sister are supportive.   One month ago, pt slipped and fell down the stairs. Pt reports doing PT exercises with his walker. Pt walked 2-3 blocks to [facility] prior to current admission, passed out and fell down. Pt reports last drinking alcohol in Feb 2015. Pt reports last using cannabis last week. Pt reports his mood has been good since d/c from [facility]. Sleep/appetite good. Pt only needed to take 2 ambien since d/c. Pt denies current SI or HI. Pt reports chronic AH which laugh and berate him, which he calls the "shadow people". Pt reports last experiencing command hallucinations telling him to hurt himself (not others) 2 months ago. Pt denies experiencing command hallucinations for the past 2 months. Pt reports his current medication regimen helps soften the voices, which is "as good as it is going to get". Pt reports occasionally seeing "shadow people". 6 years ago, pt witnessed his fiancee kill herself in front of him, and he then attempted suicide by overdosing on a bottle of ASA and vodka. RN reports that pt was confused yesterday, after apparently receiving some ativan. Pt was asking if there was caffeine in the call bell, and he spilled fluid. Today, pt is more lucid, Ryan agitated, and tremors have improved (pt apparently has chronic tremors).  Called pt's brother for collateral info [name], [phone], pt's verbal consent given): brother reports pt passed out at [facility]. Brother reports pt has not walked much over the past 2-3 months, and pt becomes tired easily and possibly has dysequilibrium. Pt walked 2-3 blocks to the [facility], and he has not walked that much in several months. Brother does not believe pt was drinking alcohol, and feels pt has been taking meds as prescribed. Pt's mood has been  stable since d/c from [facility] on 10/29/14.   Interval history: Patient was seen face-to-face for this evaluation along with the psychiatric social  service. Patient reported he has been feeling better since he was hospitalized. Patient reported he was admitted for confusion secondary to fall while walking with the walker. Patient has a recent acute psychiatric hospitalization which she successfully completed on 10/29/2014. Patient has diagnosis of posttraumatic stress disorder, depression and possibly psychosis in addition to alcohol dependence. Patient denies current symptoms of depression, anxiety, auditory/visual hallucinations, delusions, suicidal/homicidal ideation, intention or plans. Patient contract for safety and does not required inpatient psychiatric hospitalization. Patient can be discharged home when medically stable. Psychiatric consultation follow-up with the patient why he is in the hospital for further assessment and treatment. Review of labs indicated urine drug screen is positive for benzodiazepines and tetrahydrocannabinol, blood alcohol level is not significant and his lithium level is 0.65 mmol decreased per liter. Patient liver function tests AST was high at 97 and ALT is high at 120 which indicates chronic alcoholism related liver injury. His liver function tests are better than 2 weeks ago and he was actively drinking.  Past Medical History:  Past Medical History  Diagnosis Date  . Depression   . Psychosis   . PTSD (post-traumatic stress disorder)   . Seizure disorder     related to etoh seizure  . Alcohol abuse   . Schizoaffective disorder   . Anxiety     Past Surgical History  Procedure Laterality Date  . Foot surgery      right  . Hand surgery     Family History:  Family History  Problem Relation Age of Onset  . Alcoholism Mother   . CAD Father   . Depression Brother    Social History:  History  Alcohol Use No    Comment: 6-8 cans beer per day, 24 ounce cans for past 1 month     History  Drug Use  . 1.00 per week  . Special: Marijuana    Comment: THC 2 to 3 times per month    History   Social  History  . Marital Status: Single    Spouse Name: N/A  . Number of Children: N/A  . Years of Education: N/A   Social History Main Topics  . Smoking status: Current Every Day Smoker -- 1.50 packs/day for 24 years    Types: Cigarettes  . Smokeless tobacco: Never Used  . Alcohol Use: No     Comment: 6-8 cans beer per day, 24 ounce cans for past 1 month  . Drug Use: 1.00 per week    Special: Marijuana     Comment: THC 2 to 3 times per month  . Sexual Activity: Yes   Other Topics Concern  . None   Social History Narrative   Additional Social History:    Pain Medications: Pt denies abuse.  Prescriptions: Pt denies abuse.  Over the Counter: Pt denies abuse.  History of alcohol / drug use?: Yes Longest period of sobriety (when/how long): reports 3.5 years sober about 8 years ago, a couple of weeks more recently. Hx of seizures last one reports "a couple of months ago" Negative Consequences of Use: Financial, Legal, Personal relationships, Work / School Onset of Seizures: "last one 4 years ago withdrawal" Name of Substance 1: Alcohol  1 - Age of First Use: 44 years old 1 - Amount (size/oz):  6-24oz beers 1 - Frequency: daily 1 - Duration: "years off and  on" 1 - Last Use / Amount: 3 weeks  Name of Substance 2: THC 2 - Age of First Use: 15 2 - Amount (size/oz): a couple of bowls 2 - Frequency: 1-2 times per month  2 - Duration: years 2 - Last Use / Amount: unknown                  Allergies:   Allergies  Allergen Reactions  . Wellbutrin [Bupropion] Other (See Comments)    Makes me feel like I'm have a seizure    Labs:  No results found for this or any previous visit (from the past 48 hour(s)).  Vitals: Blood pressure 124/79, pulse 80, temperature 98.2 F (36.8 C), temperature source Oral, resp. rate 20, height 5\' 10"  (1.778 m), weight 92.534 kg (204 lb), SpO2 98 %.  Risk to Self: Suicidal Ideation: No Suicidal Intent: No Is patient at risk for suicide?:  No Suicidal Plan?: No Access to Means: No Specify Access to Suicidal Means: NA What has been your use of drugs/alcohol within the last 12 months?: Pt denies any alcohol or drug use in the past 3 weeks.  How many times?: 4 Other Self Harm Risks: No other self harm risk identified at this time.  Triggers for Past Attempts: Other (Comment) (fiance committed suicide in front of him) Intentional Self Injurious Behavior: None Comment - Self Injurious Behavior: Pt reported a history of burning self; no recent incidents reported at this time.  Risk to Others: Homicidal Ideation: No Thoughts of Harm to Others: No Current Homicidal Intent: No Current Homicidal Plan: No Access to Homicidal Means: No Identified Victim: NA History of harm to others?: No Assessment of Violence: On admission Violent Behavior Description: No violent behaviors observed at this time. Pt is friendly and cooperative.  Does patient have access to weapons?: No Criminal Charges Pending?: No Does patient have a court date: No Prior Inpatient Therapy: Prior Inpatient Therapy: Yes Prior Therapy Dates: 03/2011, "end of 2012, 10/2011, 12/29/2013, 02/09/2014, 05/04/2014, (others out of state) Prior Therapy Facilty/Provider(s): [facility], [place], reports some in [place], [place], and [place]  Reason for Treatment: SA, and schizoaffective disorder Prior Outpatient Therapy: Prior Outpatient Therapy: Yes Prior Therapy Dates: "a long time ago" Prior Therapy Facilty/Provider(s): [facility] Reason for Treatment: SA  Current Facility-Administered Medications  Medication Dose Route Frequency Provider Last Rate Last Dose  . docusate sodium (COLACE) capsule 100 mg  100 mg Oral BID Monica Becton, MD   100 mg at 11/06/14 0909  . folic acid (FOLVITE) tablet 1 mg  1 mg Oral Daily Ezequiel Essex, MD   1 mg at 11/06/14 0909  . gabapentin (NEURONTIN) capsule 300 mg  300 mg Oral QHS Nita Sells, MD      . heparin injection 5,000 Units  5,000  Units Subcutaneous 3 times per day Deneise Lever, MD   5,000 Units at 11/06/14 1511  . lithium carbonate capsule 300 mg  300 mg Oral BID WC Padmaja Vasireddy, MD   300 mg at 11/06/14 0900  . LORazepam (ATIVAN) injection 0-4 mg  0-4 mg Intravenous Q12H Alphonzo Severance, RPH   1 mg at 11/06/14 0030  . LORazepam (ATIVAN) tablet 1 mg  1 mg Oral Q6H PRN Ezequiel Essex, MD       Or  . LORazepam (ATIVAN) injection 1 mg  1 mg Intravenous Q6H PRN Ezequiel Essex, MD   1 mg at 11/06/14 2542  . LORazepam (ATIVAN) tablet 1 mg  1 mg Oral Q6H  PRN Deneise Lever, MD   1 mg at 11/06/14 1205   Or  . LORazepam (ATIVAN) injection 1 mg  1 mg Intravenous Q6H PRN Deneise Lever, MD   1 mg at 11/05/14 0933  . midazolam (VERSED) injection 2 mg  2 mg Intravenous Once Ezequiel Essex, MD   Stopped at 11/03/14 1908  . multivitamin with minerals tablet 1 tablet  1 tablet Oral Daily Ezequiel Essex, MD   1 tablet at 11/06/14 0909  . QUEtiapine (SEROQUEL) tablet 300 mg  300 mg Oral QHS Padmaja Vasireddy, MD   300 mg at 11/05/14 2113  . sertraline (ZOLOFT) tablet 50 mg  50 mg Oral Daily Padmaja Vasireddy, MD   50 mg at 11/06/14 0909  . thiamine (VITAMIN B-1) tablet 100 mg  100 mg Oral Daily Ezequiel Essex, MD   100 mg at 11/06/14 0737   Or  . thiamine (B-1) injection 100 mg  100 mg Intravenous Daily Ezequiel Essex, MD      . traMADol Veatrice Bourbon) tablet 50 mg  50 mg Oral Q6H PRN Monica Becton, MD   50 mg at 11/05/14 1215    Musculoskeletal: Strength & Muscle Tone: abnormal Gait & Station: unsteady Patient leans: Front  Psychiatric Specialty Exam: Physical Exam  ROS  Blood pressure 124/79, pulse 80, temperature 98.2 F (36.8 C), temperature source Oral, resp. rate 20, height 5\' 10"  (1.778 m), weight 92.534 kg (204 lb), SpO2 98 %.Body mass index is 29.27 kg/(m^2).  General Appearance: Casual and Fairly Groomed  Engineer, water::  Fair  Speech:  Clear and Coherent and Normal Rate  Volume:  Normal  Mood:  Euthymic   Affect:  Congruent and Full Range  Thought Process:  Coherent and Goal Directed  Orientation:  Full (Time, Place, and Person)  Thought Content:  Hallucinations: Auditory Visual, chronic  Suicidal Thoughts:  No  Homicidal Thoughts:  No  Memory:  Immediate;   Fair Recent;   Fair Remote;   Fair  Judgement:  Intact  Insight:  Fair  Psychomotor Activity:  Psychomotor Retardation  Concentration:  Fair  Recall:  [employer] of Knowledge:Fair  Language: Fair  Akathisia:  Negative  Handed:  Right  AIMS (if indicated):     Assets:  Communication Skills Desire for Improvement Financial Resources/Insurance Housing Social Support  ADL's:  Impaired  Cognition: WNL  Sleep:      Medical Decision Making: New problem, with additional work up planned, Review of Psycho-Social Stressors (1), Discuss test with performing physician (1), Decision to obtain old records (1), Review and summation of old records (2), Review of Last Therapy Session (1), Review of Medication Regimen & Side Effects (2) and Review of New Medication or Change in Dosage (2)  Treatment Plan Summary: Daily contact with patient to assess and evaluate symptoms and progress in treatment, Medication management and Plan : agree with resuming pt's discharge meds from [facility] (gabapentin 300 mg QHS, lithium carbonate 300 mg BID, quetiapine 300 mg QHS, sertraline 50 mg daily). Would avoid giving this pt benzodazepines (ativan, etc), due to pt's h/o alcohol abuse. Pt was discharged on ativan taper from [facility], so unclear if pt may have taken more ativan than recommended (although pt denies this). Pt's lithium level was low (0.6). Unit social worker should schedule f/u appointment with [facility] prior to discharge. Pt is scheduled for EEG tomorrow. Recommend PT consult for frequent falls.  Plan:  Continue current psychiatric medication management as above Patient does not meet criteria for psychiatric  inpatient admission. Supportive therapy provided  about ongoing stressors. schedule f/u with [facility] prior to discharge.   Appreciate psychiatric consultation and follow up as clinically required Please contact [phone] or [phone] if needs further assistance  Disposition: Pt may be discharged to home with outpatient psychiatric medication management when medically stable.   Long Brimage,[name] 11/06/2014 3:52 PM

## 2014-11-06 NOTE — Progress Notes (Signed)
Patient given discharge instructions, and verbalized an understanding of all discharge instructions.  Patient agrees with discharge plan, and is being discharged in stable medical condition.  Patient not wishing to wait for results of EEG, and was educated on the risk of not waiting for results.  Durwin Nora RN

## 2014-11-06 NOTE — Progress Notes (Signed)
EEG Completed; Results Pending  

## 2014-11-06 NOTE — Progress Notes (Signed)
CARE MANAGEMENT NOTE 11/06/2014  Patient:  [name], [name]   Account Number:  [id number]  Date Initiated:  11/06/2014  Documentation initiated by:  Edwyna Shell  Subjective/Objective Assessment:   44 yo male admitted s/p fall and encephalopathy     Action/Plan:   discharge planning   Anticipated DC Date:  11/06/2014   Anticipated DC Plan:  [place] referral  Clinical Social Worker      DC Forensic scientist  CM consult      [facility] Choice  HOME HEALTH   Choice offered to / List presented to:  C-1 Patient        [place] arranged  [place].   Status of service:  Completed, signed off Medicare Important Message given?   (If response is "NO", the following Medicare IM given date fields will be blank) Date Medicare IM given:   Medicare IM given by:   Date Additional Medicare IM given:   Additional Medicare IM given by:    Discharge Disposition:  [place]  Per UR Regulation:    If discussed at Long Length of Stay Meetings, dates discussed:    Comments:  11/06/14 Edwyna Shell RN BSN CM [phone] Patient stated that he lives at home alone and had walked to the store with his walker when he "blacked out and fell". He said that he just received a new walker last Monday when he was discharge from [facility]. He stated that he contacted the store in which he fell and the fire dept. but has not been able to locate his rolling walker. Patient stated that he cannot afford the cost of a new walker. This CM located a rolling walker at the [facility] on [address], corner of [address] for $10. L/m with patient brother Elta Gu[place] regarding the rolling walker. Patient stated that he spoke with his brother and once his brother picks him up today they will pick up the walker from the [facility]. HH referral was communicated to [place], [facility] rep.

## 2014-11-07 DIAGNOSIS — G9340 Encephalopathy, unspecified: Secondary | ICD-10-CM

## 2014-11-07 LAB — HIV-1 RNA QUANT-NO REFLEX-BLD
HIV 1 RNA Quant: <20 copies/mL
LOG10 HIV-1 RNA: UNDETERMINED log10copy/mL

## 2014-11-07 LAB — HEPATITIS C ANTIBODY (REFLEX): HCV Ab: NEGATIVE

## 2014-11-07 NOTE — Procedures (Signed)
ELECTROENCEPHALOGRAM REPORT  Patient: [name]       Room #: None EEG No. ID: [id number] Age: 44 y.o.        Sex: male Referring Physician:  Report Date:  11/07/2014        Interpreting Physician: Anthony Sar  History: [name] is an 44 y.o. male with a history of schizoaffective disorder as well as PTSD and substance abuse.  Indications for study:  Rule out encephalopathic process; rule out seizure disorder.  Technique: This is an 18 channel routine scalp EEG performed at the bedside with bipolar and monopolar montages arranged in accordance to the international 10/20 system of electrode placement.   Description: This EEG recording was performed during wakefulness. Background activity consisted of 9-10 Hz symmetrical alpha rhythm recorded from the history head regions with good attenuation with eye opening. In addition there was diffuse low amplitude 20-25 Hz beta activity recorded throughout the EEG record. Photic stimulation produced symmetrical occipital driving response. Hyperventilation produced normal symmetrical slowing response. No epileptiform discharges were recorded. There was no abnormal slowing of cerebral activity.  Interpretation: This is a normal EEG recording during wakefulness. No evidence of encephalopathic process, nor evidence of an epileptic disorder was demonstrated.   Rush Farmer M.D. [facility] [phone]

## 2014-11-08 ENCOUNTER — Telehealth: Payer: Self-pay | Admitting: Emergency Medicine

## 2014-11-08 NOTE — Telephone Encounter (Signed)
Reference PT &OT

## 2014-11-08 NOTE — Telephone Encounter (Signed)
Dr Adrian Blackwater

## 2014-11-17 DIAGNOSIS — F250 Schizoaffective disorder, bipolar type: Secondary | ICD-10-CM | POA: Diagnosis not present

## 2014-11-17 DIAGNOSIS — F251 Schizoaffective disorder, depressive type: Secondary | ICD-10-CM | POA: Diagnosis not present

## 2014-11-20 ENCOUNTER — Ambulatory Visit: Payer: Medicare Other | Attending: Family Medicine | Admitting: Family Medicine

## 2014-11-20 ENCOUNTER — Encounter: Payer: Self-pay | Admitting: Family Medicine

## 2014-11-20 VITALS — BP 116/74 | HR 102 | Temp 98.5°F | Resp 18 | Ht 72.0 in | Wt 195.0 lb

## 2014-11-20 DIAGNOSIS — F250 Schizoaffective disorder, bipolar type: Secondary | ICD-10-CM

## 2014-11-20 DIAGNOSIS — F1010 Alcohol abuse, uncomplicated: Secondary | ICD-10-CM | POA: Insufficient documentation

## 2014-11-20 DIAGNOSIS — Z0000 Encounter for general adult medical examination without abnormal findings: Secondary | ICD-10-CM | POA: Diagnosis not present

## 2014-11-20 DIAGNOSIS — R945 Abnormal results of liver function studies: Secondary | ICD-10-CM

## 2014-11-20 DIAGNOSIS — F17210 Nicotine dependence, cigarettes, uncomplicated: Secondary | ICD-10-CM | POA: Insufficient documentation

## 2014-11-20 DIAGNOSIS — W109XXA Fall (on) (from) unspecified stairs and steps, initial encounter: Secondary | ICD-10-CM | POA: Diagnosis not present

## 2014-11-20 DIAGNOSIS — R799 Abnormal finding of blood chemistry, unspecified: Secondary | ICD-10-CM | POA: Diagnosis not present

## 2014-11-20 DIAGNOSIS — F259 Schizoaffective disorder, unspecified: Secondary | ICD-10-CM | POA: Insufficient documentation

## 2014-11-20 DIAGNOSIS — F17200 Nicotine dependence, unspecified, uncomplicated: Secondary | ICD-10-CM | POA: Insufficient documentation

## 2014-11-20 DIAGNOSIS — R7989 Other specified abnormal findings of blood chemistry: Secondary | ICD-10-CM | POA: Diagnosis not present

## 2014-11-20 DIAGNOSIS — S29002A Unspecified injury of muscle and tendon of back wall of thorax, initial encounter: Secondary | ICD-10-CM | POA: Insufficient documentation

## 2014-11-20 DIAGNOSIS — Z720 Tobacco use: Secondary | ICD-10-CM

## 2014-11-20 LAB — COMPLETE METABOLIC PANEL WITH GFR
ALT: 23 U/L (ref 0–53)
AST: 23 U/L (ref 0–37)
Albumin: 5 g/dL (ref 3.5–5.2)
Alkaline Phosphatase: 103 U/L (ref 39–117)
BUN: 9 mg/dL (ref 6–23)
CO2: 23 mEq/L (ref 19–32)
Calcium: 10 mg/dL (ref 8.4–10.5)
Chloride: 103 mEq/L (ref 96–112)
Creat: 0.87 mg/dL (ref 0.50–1.35)
GFR, Est African American: >89 mL/min
GFR, Est Non African American: >89 mL/min
Glucose, Bld: 99 mg/dL (ref 70–99)
Potassium: 5.2 mEq/L (ref 3.5–5.3)
Sodium: 145 mEq/L (ref 135–145)
Total Bilirubin: 0.4 mg/dL (ref 0.2–1.2)
Total Protein: 8 g/dL (ref 6.0–8.3)

## 2014-11-20 LAB — LIPID PANEL
Cholesterol: 244 mg/dL — ABNORMAL HIGH (ref 0–200)
HDL: 64 mg/dL (ref >=40)
LDL Cholesterol: 134 mg/dL — ABNORMAL HIGH (ref 0–99)
Total CHOL/HDL Ratio: 3.8 Ratio
Triglycerides: 228 mg/dL — ABNORMAL HIGH (ref <150)
VLDL: 46 mg/dL — ABNORMAL HIGH (ref 0–40)

## 2014-11-20 NOTE — Progress Notes (Signed)
   Subjective:    Patient ID: [name], male    DOB: [birth year], 44 y.o.   MRN: [id number] CC:  fall down stairs with pain, wellness physical  HPI 44 yo M here for a physical.   Soc Hx: 1.5 PPD daily for 25 years, previous ETOH abuse, none know  Med Hx: schizoaffective disorder Fam hx: alcohol abuse in mother  Review of Systems  General:  Negative for nexplained weight loss, fever Skin: Negative for new or changing mole, sore that won't heal HEENT: Negative for trouble hearing, trouble seeing, ringing in ears, mouth sores, hoarseness, change in voice, dysphagia. CV:  Negative for chest pain, dyspnea, edema, palpitations Resp: Negative for cough, dyspnea, hemoptysis GI: Negative for nausea, vomiting, diarrhea, constipation, abdominal pain, melena, hematochezia. GU: Negative for dysuria, incontinence, urinary hesitance, hematuria, vaginal or penile discharge, polyuria, sexual difficulty, lumps in testicle or breasts OQH:[id number] for muscle aches in upper back mostly following fall last week.  Neuro: Negative for headaches, weakness, numbness, dizziness, passing out/fainting Psych: Positive for auditory hallucinations with voices laughing at him      Objective:   Physical Exam BP 116/74 mmHg  Pulse 102  Temp(Src) 98.5 F (36.9 C) (Oral)  Resp 18  Ht 6' (1.829 m)  Wt 195 lb (88.451 kg)  BMI 26.44 kg/m2  SpO2 96%  General Appearance:    Alert, cooperative, no distress, appears stated age  Head:    Normocephalic, without obvious abnormality, atraumatic  Eyes:    PERRL, conjunctiva/corneas clear, EOM's intact,    both eyes       Ears:    Normal TM's and external ear canals, both ears  Nose:   Nares normal, septum midline, mucosa normal, no drainage   or sinus tenderness  Throat:   Lips, mucosa, and tongue normal; teeth and gums normal  Neck:   Supple, symmetrical, trachea midline, no adenopathy;       thyroid:  No enlargement/tenderness/nodules.  Back:     Symmetric, no  curvature, ROM normal, no CVA tenderness  Lungs:     Normal WOB, exp wheezing R ung   Chest wall:    No tenderness or deformity  Heart:    Regular rate and rhythm, S1 and S2 normal, no murmur, rub   or gallop  Abdomen:     Soft, non-tender, bowel sounds active all four quadrants,    no masses, no organomegaly  Genitalia:    Normal male without lesion, discharge or tenderness  Rectal:    Deferred   Extremities:   Extremities normal, atraumatic, no cyanosis or edema  Pulses:   2+ and symmetric all extremities  Skin:   Skin color, texture, turgor normal, no rashes or lesions  Lymph nodes:   Cervical, supraclavicular, and axillary nodes normal  Neurologic:   CNII-XII intact. Normal strength, sensation and reflexes      throughout         Assessment & Plan:

## 2014-11-20 NOTE — Progress Notes (Signed)
Establish Care Annual physical

## 2014-11-20 NOTE — Patient Instructions (Addendum)
[name],  Thank you for coming in today. It was a pleasure meeting you. I look forward to being your primary doctor.  1. Checking cholesterol and re-checking liver enzymes.   2.  Smoking: Smoking cessation support: smoking cessation hotline: 1-800-QUIT-NOW.  Smoking cessation classes are available through [facility]. Call [phone] or visit our website at [email or web address]  You will be called with lab results  F/u in 3 months for elevated liver enzymes   Dr. Adrian Blackwater

## 2014-11-21 ENCOUNTER — Encounter: Payer: Self-pay | Admitting: Family Medicine

## 2014-11-21 NOTE — Assessment & Plan Note (Signed)
A: heavy smoker, no desire to quit at this time P: counseling services provided Lipid panel abnormal, but not in a statin risk group at this time.

## 2014-11-21 NOTE — Assessment & Plan Note (Signed)
A; elevated LFTs in setting of ETOH abuse. Patient has quit ETOH. Repeat LFTs normal. P; resolve problem

## 2014-11-21 NOTE — Assessment & Plan Note (Signed)
A: persistent with hallucinations, patient compliant with treatment P: continue current regimen

## 2014-11-28 ENCOUNTER — Encounter (HOSPITAL_COMMUNITY): Payer: Self-pay | Admitting: Emergency Medicine

## 2014-11-28 ENCOUNTER — Emergency Department (HOSPITAL_COMMUNITY)
Admission: EM | Admit: 2014-11-28 | Discharge: 2014-11-29 | Disposition: A | Discharge status: Other Acute Inpatient Hospital | Payer: Medicare Other | Attending: Emergency Medicine | Admitting: Emergency Medicine

## 2014-11-28 DIAGNOSIS — R45851 Suicidal ideations: Secondary | ICD-10-CM | POA: Diagnosis present

## 2014-11-28 DIAGNOSIS — Z8669 Personal history of other diseases of the nervous system and sense organs: Secondary | ICD-10-CM | POA: Diagnosis not present

## 2014-11-28 DIAGNOSIS — Z79899 Other long term (current) drug therapy: Secondary | ICD-10-CM | POA: Diagnosis not present

## 2014-11-28 DIAGNOSIS — Z720 Tobacco use: Secondary | ICD-10-CM | POA: Insufficient documentation

## 2014-11-28 DIAGNOSIS — F259 Schizoaffective disorder, unspecified: Secondary | ICD-10-CM | POA: Diagnosis present

## 2014-11-28 DIAGNOSIS — F1210 Cannabis abuse, uncomplicated: Secondary | ICD-10-CM | POA: Diagnosis not present

## 2014-11-28 DIAGNOSIS — F250 Schizoaffective disorder, bipolar type: Secondary | ICD-10-CM | POA: Diagnosis not present

## 2014-11-28 DIAGNOSIS — F4310 Post-traumatic stress disorder, unspecified: Secondary | ICD-10-CM | POA: Diagnosis not present

## 2014-11-28 DIAGNOSIS — F1020 Alcohol dependence, uncomplicated: Secondary | ICD-10-CM | POA: Diagnosis not present

## 2014-11-28 DIAGNOSIS — F419 Anxiety disorder, unspecified: Secondary | ICD-10-CM | POA: Diagnosis not present

## 2014-11-28 DIAGNOSIS — F1010 Alcohol abuse, uncomplicated: Secondary | ICD-10-CM

## 2014-11-28 DIAGNOSIS — R440 Auditory hallucinations: Secondary | ICD-10-CM

## 2014-11-28 DIAGNOSIS — F17200 Nicotine dependence, unspecified, uncomplicated: Secondary | ICD-10-CM

## 2014-11-28 LAB — RAPID URINE DRUG SCREEN, HOSP PERFORMED
Amphetamines: NOT DETECTED
Barbiturates: NOT DETECTED
Benzodiazepines: NOT DETECTED
Cocaine: NOT DETECTED
Opiates: NOT DETECTED
Tetrahydrocannabinol: POSITIVE — AB

## 2014-11-28 LAB — SALICYLATE LEVEL: Salicylate Lvl: <4 mg/dL (ref 2.8–20.0)

## 2014-11-28 LAB — ETHANOL: Alcohol, Ethyl (B): 289 mg/dL — ABNORMAL HIGH (ref 0–9)

## 2014-11-28 LAB — COMPREHENSIVE METABOLIC PANEL
ALT: 65 U/L — ABNORMAL HIGH (ref 0–53)
AST: 68 U/L — ABNORMAL HIGH (ref 0–37)
Albumin: 5.1 g/dL (ref 3.5–5.2)
Alkaline Phosphatase: 121 U/L — ABNORMAL HIGH (ref 39–117)
Anion gap: 19 — ABNORMAL HIGH (ref 5–15)
BUN: 7 mg/dL (ref 6–23)
CO2: 28 mmol/L (ref 19–32)
Calcium: 9.3 mg/dL (ref 8.4–10.5)
Chloride: 91 mmol/L — ABNORMAL LOW (ref 96–112)
Creatinine, Ser: 0.81 mg/dL (ref 0.50–1.35)
GFR calc Af Amer: >90 mL/min (ref >90)
GFR calc non Af Amer: >90 mL/min (ref >90)
Glucose, Bld: 104 mg/dL — ABNORMAL HIGH (ref 70–99)
Potassium: 3.9 mmol/L (ref 3.5–5.1)
Sodium: 138 mmol/L (ref 135–145)
Total Bilirubin: 0.5 mg/dL (ref 0.3–1.2)
Total Protein: 8.5 g/dL — ABNORMAL HIGH (ref 6.0–8.3)

## 2014-11-28 LAB — CBC
HCT: 45.1 % (ref 39.0–52.0)
Hemoglobin: 15.7 g/dL (ref 13.0–17.0)
MCH: 34.3 pg — ABNORMAL HIGH (ref 26.0–34.0)
MCHC: 34.8 g/dL (ref 30.0–36.0)
MCV: 98.5 fL (ref 78.0–100.0)
Platelets: 307 10*3/uL (ref 150–400)
RBC: 4.58 MIL/uL (ref 4.22–5.81)
RDW: 16.4 % — ABNORMAL HIGH (ref 11.5–15.5)
WBC: 5 10*3/uL (ref 4.0–10.5)

## 2014-11-28 LAB — ACETAMINOPHEN LEVEL: Acetaminophen (Tylenol), Serum: <10 ug/mL — ABNORMAL LOW (ref 10–30)

## 2014-11-28 MED ORDER — IBUPROFEN 200 MG PO TABS
600.0000 mg | ORAL_TABLET | Freq: Three times a day (TID) | ORAL | Status: DC | PRN
  Administered 2014-11-28: 600 mg via ORAL
  Filled 2014-11-28: qty 3

## 2014-11-28 MED ORDER — LORAZEPAM 2 MG/ML IJ SOLN: 0.0000 mg | Freq: Four times a day (QID) | INTRAMUSCULAR | Status: DC

## 2014-11-28 MED ORDER — LORAZEPAM 1 MG PO TABS
0.0000 mg | ORAL_TABLET | Freq: Two times a day (BID) | ORAL | Status: DC
  Filled 2014-11-28: qty 4

## 2014-11-28 MED ORDER — LORAZEPAM 2 MG/ML IJ SOLN: 0.0000 mg | Freq: Two times a day (BID) | INTRAMUSCULAR | Status: DC

## 2014-11-28 MED ORDER — VITAMIN B-1 100 MG PO TABS
100.0000 mg | ORAL_TABLET | Freq: Every day | ORAL | Status: DC
  Administered 2014-11-28: 100 mg via ORAL
  Filled 2014-11-28 (×2): qty 1

## 2014-11-28 MED ORDER — ALUM & MAG HYDROXIDE-SIMETH 200-200-20 MG/5ML PO SUSP: 30.0000 mL | ORAL | Status: DC | PRN

## 2014-11-28 MED ORDER — ONDANSETRON HCL 4 MG PO TABS
4.0000 mg | ORAL_TABLET | Freq: Three times a day (TID) | ORAL | Status: DC | PRN
  Administered 2014-11-28: 4 mg via ORAL
  Filled 2014-11-28: qty 1

## 2014-11-28 MED ORDER — THIAMINE HCL 100 MG/ML IJ SOLN
100.0000 mg | Freq: Every day | INTRAMUSCULAR | Status: DC
  Filled 2014-11-28: qty 2

## 2014-11-28 MED ORDER — NICOTINE 21 MG/24HR TD PT24
21.0000 mg | MEDICATED_PATCH | Freq: Every day | TRANSDERMAL | Status: DC
  Administered 2014-11-28 – 2014-11-29 (×2): 21 mg via TRANSDERMAL
  Filled 2014-11-28 (×2): qty 1

## 2014-11-28 MED ORDER — LORAZEPAM 1 MG PO TABS
0.0000 mg | ORAL_TABLET | Freq: Four times a day (QID) | ORAL | Status: DC
  Administered 2014-11-28 (×2): 4 mg via ORAL
  Administered 2014-11-28: 2 mg via ORAL
  Administered 2014-11-29 (×2): 1 mg via ORAL
  Filled 2014-11-28 (×2): qty 1
  Filled 2014-11-28 (×3): qty 2

## 2014-11-28 MED ORDER — THIAMINE HCL 100 MG/ML IJ SOLN: 100.0000 mg | Freq: Every day | INTRAMUSCULAR | Status: DC

## 2014-11-28 MED ORDER — VITAMIN B-1 100 MG PO TABS
100.0000 mg | ORAL_TABLET | Freq: Every day | ORAL | Status: DC
  Administered 2014-11-29: 100 mg via ORAL

## 2014-11-28 NOTE — ED Provider Notes (Signed)
CSN: [id number]     Arrival date & time 11/28/14  1105 History   First MD Initiated Contact with Patient 11/28/14 1129     Chief Complaint  Patient presents with  . Suicidal  . Medical Clearance  . Withdrawal  . Fall     (Consider location/radiation/quality/duration/timing/severity/associated sxs/prior Treatment) HPI  Patient is a 44 y.o. male patient presenting due to persistent suicidal thoughts. He endorses past medical history of schizoaffective disorder and alcohol abuse. He reports daily alcohol intoxication to "self-treat" the voices in his head that are constantly laughing at him; his last drink was last night. He reports hearing voices during interview and points to the right. He has not been taking his prescribed medications and states that the meds have never been able to get rid of the voices, only quiet them. He thinks repeatedly about cutting his wrists with one of his "good butcher knives". He reports falling several times within the last couple of weeks, headache and diffuse abdominal pain. Patient would like some help with his falling and suicidal thoughts. Smokes 1.5 PPD daily; would like a nicotine patch.  Past Medical History  Diagnosis Date  . Psychosis   . PTSD (post-traumatic stress disorder)   . Seizure disorder     related to etoh seizure  . Alcohol abuse   . Schizoaffective disorder   . Anxiety     at age 35  . Depression     at age 74   Past Surgical History  Procedure Laterality Date  . Foot surgery      right  . Hand surgery     Family History  Problem Relation Age of Onset  . Alcoholism Mother   . CAD Father   . Depression Brother    History  Substance Use Topics  . Smoking status: Current Every Day Smoker -- 1.50 packs/day for 24 years    Types: Cigarettes  . Smokeless tobacco: Never Used  . Alcohol Use: No     Comment: previously 6-8 (24 oz) cans beer per day     Review of Systems Ten systems reviewed and are negative for acute  change, except as noted in the HPI.    Allergies  Wellbutrin  Home Medications   Prior to Admission medications   Medication Sig Start Date End Date Taking? Authorizing Provider  gabapentin (NEURONTIN) 300 MG capsule Take 1 capsule (300 mg total) by mouth at bedtime. For agitation 10/30/14  Yes Encarnacion Slates, NP  lithium carbonate 300 MG capsule Take 1 capsule (300 mg total) by mouth 2 (two) times daily with a meal. For mood stabilization 10/30/14  Yes Encarnacion Slates, NP  naproxen sodium (ANAPROX) 220 MG tablet Take 1 tablet (220 mg total) by mouth daily as needed (pain). 10/30/14  Yes Encarnacion Slates, NP  QUEtiapine (SEROQUEL) 400 MG tablet Take 400 mg by mouth at bedtime.   Yes Historical Provider, MD  sertraline (ZOLOFT) 100 MG tablet Take 100 mg by mouth daily.   Yes Historical Provider, MD   BP 138/91 mmHg  Pulse 103  Temp(Src) 98.4 F (36.9 C) (Oral)  Resp 19  SpO2 92% Physical Exam   General Appearance:   Alert, cooperative, mildly agitated, appears stated age  Head:   Normocephalic, without obvious abnormality, atraumatic  Eyes:   PERRL, conjunctiva/corneas clear, EOM's intact, both eyes  Lungs:   Clear to auscultation bilaterally  Heart:   Regular rate and rhythm, S1 and S2 normal, no murmur, rub  or gallop  Abdomen:   Soft, non-tender, bowel sounds active all four quadrants,   no masses, no organomegaly  Extremities:  Extremities normal, atraumatic, no cyanosis or edema  Neurologic:  CNII-XII intact, mildly tremulous        ED Course  Procedures (including critical care time) Labs Review Labs Reviewed  ACETAMINOPHEN LEVEL  CBC  COMPREHENSIVE METABOLIC PANEL  ETHANOL  SALICYLATE LEVEL  URINE RAPID DRUG SCREEN (HOSP PERFORMED)    Imaging Review No results found.   EKG Interpretation None      MDM   Final diagnoses:  Suicidal intent  Continuous auditory hallucinations  ETOH abuse  Current smoker  Depression    Patient with  schizoaffective disorder, active auditory hallucinations. No command hallucinations. Patient is clearly depressed. Plan of suicide by slicing his wrists. Actively using alcohol and somewhat tremulous. No history of DVTs. Appears safe for psych evaluation.    Margarita Mail, PA-C 11/28/14 1217  Quintella Reichert, MD 11/28/14 [phone]

## 2014-11-28 NOTE — ED Notes (Signed)
Patient transferred to room 29 in wheelchair, belongings placed in locker, patient being assessed by Education officer, museum. Patient resting comfortably in stretcher.

## 2014-11-28 NOTE — Progress Notes (Signed)
CSW referred pt to the following hospitals seeking inpatient placement:  [facility], [place] ED CSW 11/28/2014 11:04 PM

## 2014-11-28 NOTE — Progress Notes (Signed)
TTS Counselor was notified by Waylan Boga, NP that pt was denied at [facility] due to his alcohol use (exclusionary). This is noted in the shift report also.

## 2014-11-28 NOTE — ED Notes (Signed)
Pt states he called 911 today because he was "feeling suicidal and going through alcohol withdrawls." Said his last drink was last night sometime. Pt states he's been falling more over the last month. Pt states his plan was to cut himself.

## 2014-11-28 NOTE — BH Assessment (Signed)
Writer informed TTS ([place]) of the consult.  

## 2014-11-28 NOTE — ED Notes (Signed)
Pt and belongings were wanded
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
## 2014-11-28 NOTE — Progress Notes (Signed)
TTS Counselor faxed over supporting paperwork for this pt to the following facilities: [facility], [place] This information is reflected on the shift report.

## 2014-11-29 ENCOUNTER — Encounter (HOSPITAL_COMMUNITY): Payer: Self-pay | Admitting: *Deleted

## 2014-11-29 ENCOUNTER — Inpatient Hospital Stay (HOSPITAL_COMMUNITY)
Admission: AD | Admit: 2014-11-29 | Discharge: 2014-12-06 | DRG: 885 | Disposition: A | Discharge status: Home / Self Care | Payer: Medicare Other | Source: Intra-hospital | Attending: Psychiatry | Admitting: Psychiatry

## 2014-11-29 DIAGNOSIS — G4700 Insomnia, unspecified: Secondary | ICD-10-CM | POA: Diagnosis present

## 2014-11-29 DIAGNOSIS — Z8249 Family history of ischemic heart disease and other diseases of the circulatory system: Secondary | ICD-10-CM | POA: Diagnosis not present

## 2014-11-29 DIAGNOSIS — F1020 Alcohol dependence, uncomplicated: Secondary | ICD-10-CM | POA: Diagnosis present

## 2014-11-29 DIAGNOSIS — F1210 Cannabis abuse, uncomplicated: Secondary | ICD-10-CM | POA: Diagnosis present

## 2014-11-29 DIAGNOSIS — F4312 Post-traumatic stress disorder, chronic: Secondary | ICD-10-CM | POA: Diagnosis present

## 2014-11-29 DIAGNOSIS — R45851 Suicidal ideations: Secondary | ICD-10-CM | POA: Diagnosis not present

## 2014-11-29 DIAGNOSIS — Z811 Family history of alcohol abuse and dependence: Secondary | ICD-10-CM | POA: Diagnosis not present

## 2014-11-29 DIAGNOSIS — F410 Panic disorder [episodic paroxysmal anxiety] without agoraphobia: Secondary | ICD-10-CM | POA: Diagnosis present

## 2014-11-29 DIAGNOSIS — F17210 Nicotine dependence, cigarettes, uncomplicated: Secondary | ICD-10-CM | POA: Diagnosis present

## 2014-11-29 DIAGNOSIS — F250 Schizoaffective disorder, bipolar type: Secondary | ICD-10-CM | POA: Diagnosis not present

## 2014-11-29 DIAGNOSIS — F10239 Alcohol dependence with withdrawal, unspecified: Secondary | ICD-10-CM | POA: Diagnosis present

## 2014-11-29 DIAGNOSIS — F314 Bipolar disorder, current episode depressed, severe, without psychotic features: Secondary | ICD-10-CM | POA: Diagnosis not present

## 2014-11-29 DIAGNOSIS — F251 Schizoaffective disorder, depressive type: Secondary | ICD-10-CM | POA: Diagnosis not present

## 2014-11-29 DIAGNOSIS — F259 Schizoaffective disorder, unspecified: Secondary | ICD-10-CM | POA: Diagnosis present

## 2014-11-29 MED ORDER — MAGNESIUM HYDROXIDE 400 MG/5ML PO SUSP: 30.0000 mL | Freq: Every day | ORAL | Status: DC | PRN

## 2014-11-29 MED ORDER — GABAPENTIN 300 MG PO CAPS
300.0000 mg | ORAL_CAPSULE | Freq: Two times a day (BID) | ORAL | Status: DC
  Administered 2014-11-29 – 2014-11-30 (×2): 300 mg via ORAL
  Filled 2014-11-29 (×5): qty 1

## 2014-11-29 MED ORDER — LORAZEPAM 1 MG PO TABS
1.0000 mg | ORAL_TABLET | Freq: Two times a day (BID) | ORAL | Status: AC
  Administered 2014-12-02 – 2014-12-03 (×2): 1 mg via ORAL
  Filled 2014-11-29 (×2): qty 1

## 2014-11-29 MED ORDER — LOPERAMIDE HCL 2 MG PO CAPS
2.0000 mg | ORAL_CAPSULE | ORAL | Status: AC | PRN
  Administered 2014-11-30: 2 mg via ORAL
  Filled 2014-11-29: qty 1

## 2014-11-29 MED ORDER — ACETAMINOPHEN 325 MG PO TABS: 650.0000 mg | ORAL_TABLET | Freq: Four times a day (QID) | ORAL | Status: DC | PRN

## 2014-11-29 MED ORDER — LORAZEPAM 1 MG PO TABS
1.0000 mg | ORAL_TABLET | Freq: Four times a day (QID) | ORAL | Status: AC
  Administered 2014-11-29 – 2014-12-01 (×6): 1 mg via ORAL
  Filled 2014-11-29 (×6): qty 1

## 2014-11-29 MED ORDER — LORAZEPAM 1 MG PO TABS
1.0000 mg | ORAL_TABLET | Freq: Every day | ORAL | Status: AC
  Administered 2014-12-04: 1 mg via ORAL
  Filled 2014-11-29: qty 1

## 2014-11-29 MED ORDER — ALUM & MAG HYDROXIDE-SIMETH 200-200-20 MG/5ML PO SUSP: 30.0000 mL | ORAL | Status: DC | PRN

## 2014-11-29 MED ORDER — GABAPENTIN 600 MG PO TABS
300.0000 mg | ORAL_TABLET | Freq: Two times a day (BID) | ORAL | Status: DC
Start: 2014-11-29 — End: 2014-11-29
  Filled 2014-11-29 (×2): qty 0.5

## 2014-11-29 MED ORDER — SERTRALINE HCL 50 MG PO TABS
100.0000 mg | ORAL_TABLET | Freq: Every day | ORAL | Status: DC
  Administered 2014-11-29: 100 mg via ORAL
  Filled 2014-11-29: qty 2

## 2014-11-29 MED ORDER — LORAZEPAM 1 MG PO TABS: 1.0000 mg | ORAL_TABLET | Freq: Three times a day (TID) | ORAL | Status: DC

## 2014-11-29 MED ORDER — ADULT MULTIVITAMIN W/MINERALS CH
1.0000 | ORAL_TABLET | Freq: Every day | ORAL | Status: DC
  Administered 2014-11-30 – 2014-12-06 (×7): 1 via ORAL
  Filled 2014-11-29 (×9): qty 1

## 2014-11-29 MED ORDER — TRAZODONE HCL 100 MG PO TABS
100.0000 mg | ORAL_TABLET | Freq: Every evening | ORAL | Status: DC | PRN
  Administered 2014-11-29: 100 mg via ORAL
  Filled 2014-11-29: qty 14
  Filled 2014-11-29: qty 1

## 2014-11-29 MED ORDER — TRAZODONE HCL 100 MG PO TABS
100.0000 mg | ORAL_TABLET | Freq: Every evening | ORAL | Status: DC | PRN
Start: 2014-11-29 — End: 2014-11-29

## 2014-11-29 MED ORDER — ONDANSETRON 4 MG PO TBDP: 4.0000 mg | ORAL_TABLET | Freq: Four times a day (QID) | ORAL | Status: AC | PRN

## 2014-11-29 MED ORDER — GABAPENTIN 300 MG PO CAPS
300.0000 mg | ORAL_CAPSULE | Freq: Two times a day (BID) | ORAL | Status: DC
  Administered 2014-11-29: 300 mg via ORAL
  Filled 2014-11-29: qty 1

## 2014-11-29 MED ORDER — LORAZEPAM 1 MG PO TABS: 1.0000 mg | ORAL_TABLET | Freq: Two times a day (BID) | ORAL | Status: DC

## 2014-11-29 MED ORDER — LORAZEPAM 1 MG PO TABS
1.0000 mg | ORAL_TABLET | Freq: Four times a day (QID) | ORAL | Status: DC | PRN
  Administered 2014-11-30 – 2014-12-01 (×2): 1 mg via ORAL
  Filled 2014-11-29 (×3): qty 1

## 2014-11-29 MED ORDER — LORAZEPAM 1 MG PO TABS
1.0000 mg | ORAL_TABLET | Freq: Three times a day (TID) | ORAL | Status: AC
  Administered 2014-12-01 – 2014-12-02 (×3): 1 mg via ORAL
  Filled 2014-11-29 (×3): qty 1

## 2014-11-29 MED ORDER — LORAZEPAM 1 MG PO TABS: 1.0000 mg | ORAL_TABLET | Freq: Every day | ORAL | Status: DC

## 2014-11-29 MED ORDER — SERTRALINE HCL 100 MG PO TABS
100.0000 mg | ORAL_TABLET | Freq: Every day | ORAL | Status: DC
Start: 2014-11-30 — End: 2014-11-30
  Administered 2014-11-30: 100 mg via ORAL
  Filled 2014-11-29 (×2): qty 1

## 2014-11-29 MED ORDER — THIAMINE HCL 100 MG/ML IJ SOLN: 100.0000 mg | Freq: Every day | INTRAMUSCULAR | Status: DC

## 2014-11-29 MED ORDER — HYDROXYZINE HCL 25 MG PO TABS
25.0000 mg | ORAL_TABLET | Freq: Four times a day (QID) | ORAL | Status: AC | PRN
  Administered 2014-11-30: 25 mg via ORAL
  Filled 2014-11-29 (×2): qty 1

## 2014-11-29 MED ORDER — ARIPIPRAZOLE 5 MG PO TABS
5.0000 mg | ORAL_TABLET | Freq: Every day | ORAL | Status: DC
  Administered 2014-11-30 – 2014-12-01 (×2): 5 mg via ORAL
  Filled 2014-11-29 (×3): qty 1

## 2014-11-29 MED ORDER — ONDANSETRON 4 MG PO TBDP: 4.0000 mg | ORAL_TABLET | Freq: Four times a day (QID) | ORAL | Status: DC | PRN

## 2014-11-29 MED ORDER — ARIPIPRAZOLE 5 MG PO TABS
5.0000 mg | ORAL_TABLET | Freq: Every day | ORAL | Status: DC
  Administered 2014-11-29: 5 mg via ORAL
  Filled 2014-11-29: qty 1

## 2014-11-29 MED ORDER — LORAZEPAM 1 MG PO TABS
1.0000 mg | ORAL_TABLET | Freq: Four times a day (QID) | ORAL | Status: DC
  Administered 2014-11-29: 1 mg via ORAL
  Filled 2014-11-29: qty 1

## 2014-11-29 MED ORDER — LOPERAMIDE HCL 2 MG PO CAPS
2.0000 mg | ORAL_CAPSULE | ORAL | Status: DC | PRN
  Administered 2014-11-29: 4 mg via ORAL
  Filled 2014-11-29: qty 2

## 2014-11-29 MED ORDER — ADULT MULTIVITAMIN W/MINERALS CH
1.0000 | ORAL_TABLET | Freq: Every day | ORAL | Status: DC
  Administered 2014-11-29: 1 via ORAL
  Filled 2014-11-29 (×3): qty 1

## 2014-11-29 MED ORDER — HYDROXYZINE HCL 25 MG PO TABS: 25.0000 mg | ORAL_TABLET | Freq: Four times a day (QID) | ORAL | Status: DC | PRN

## 2014-11-29 MED ORDER — VITAMIN B-1 100 MG PO TABS
100.0000 mg | ORAL_TABLET | Freq: Every day | ORAL | Status: DC
  Administered 2014-11-30 – 2014-12-06 (×7): 100 mg via ORAL
  Filled 2014-11-29 (×9): qty 1

## 2014-11-29 MED ORDER — VITAMIN B-1 100 MG PO TABS
100.0000 mg | ORAL_TABLET | Freq: Every day | ORAL | Status: DC
  Filled 2014-11-29: qty 1

## 2014-11-29 MED ORDER — LORAZEPAM 1 MG PO TABS: 1.0000 mg | ORAL_TABLET | Freq: Four times a day (QID) | ORAL | Status: DC | PRN

## 2014-11-29 NOTE — ED Notes (Signed)
Psych Team in with patient.

## 2014-11-29 NOTE — ED Notes (Signed)
Patient talking to his mother on the phone. Patient tearful and keeps repeating, "I am tired of everything."

## 2014-11-29 NOTE — ED Notes (Addendum)
Psych team in with patient.

## 2014-11-29 NOTE — Progress Notes (Signed)
D: Pt presents blunted in affect and depressed in mood. Pt endorsing the following withdrawal symptoms: sweaty, shaky, anxiety, tactile disturbances, abd cramping/ache, and sensitivity to light. Pt endorses SI. Pt verbally contracts for safety. Pt having VH of "shadows". Pt reports hearing voices telling him that he would be better off dead.  A: Writer administered scheduled and prn medications to pt, per MD orders. Continued support and availability as needed was extended to this pt. Staff continue to monitor pt with q50min checks.  R: No adverse drug reactions noted. Pt receptive to treatment. Pt remains safe at this time.

## 2014-11-29 NOTE — Progress Notes (Signed)
Adult Psychoeducational Group Note  Date:  11/29/2014 Time:  8:00 pm  Group Topic/Focus:  Wrap-Up Group:   The focus of this group is to help patients review their daily goal of treatment and discuss progress on daily workbooks.  Participation Level:  Did Not Attend   Madelyn Flavors 11/29/2014, 11:23 PM

## 2014-11-29 NOTE — Tx Team (Signed)
Initial Interdisciplinary Treatment Plan   PATIENT STRESSORS: Substance abuse "The voices"   PATIENT STRENGTHS: Capable of independent living Communication skills Supportive family/friends   PROBLEM LIST: Problem List/Patient Goals Date to be addressed Date deferred Reason deferred Estimated date of resolution  Safety/Suicide Risk 11/29/2014     Depression 11/29/2014     AVH 11/29/2014     "Not want to kill myself anymore." 11/29/2014     "Want to deal with this without drinking." 11/29/2014                              DISCHARGE CRITERIA:  Improved stabilization in mood, thinking, and/or behavior Verbal commitment to aftercare and medication compliance Withdrawal symptoms are absent or subacute and managed without 24-hour nursing intervention  PRELIMINARY DISCHARGE PLAN: Attend 12-step recovery group Outpatient therapy Return to previous living arrangement  PATIENT/FAMIILY INVOLVEMENT: This treatment plan has been presented to and reviewed with the patient, [name].  The patient and family have been given the opportunity to ask questions and make suggestions.  Charlyne Quale A 11/29/2014, 5:58 PM
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
## 2014-11-29 NOTE — Consult Note (Signed)
[facility] Consult   Reason for Consult: alcohol withdrawal and suicidal thoughts Referring Physician:  EDP Patient Identification: [name] MRN:  [id number] Principal Diagnosis: Alcohol use disorder, severe, dependence Diagnosis:   Patient Active Problem List   Diagnosis Date Noted  . Alcohol use disorder, severe, dependence [F10.20] 11/29/2014    Priority: High  . Cannabis abuse [F12.10] 12/30/2013    Priority: High  . Schizoaffective disorder [F25.9] 12/29/2013    Priority: High  . Post traumatic stress disorder (PTSD) [F43.10] 11/03/2011    Priority: High  . Current smoker [Z72.0] 11/20/2014  . Tremors [G25.2] 11/03/2014  . Chronic posttraumatic stress disorder [F43.12]   . Protein-calorie malnutrition, severe [E43] 10/19/2014  . Hallucinations, visual [R44.1]   . Noncompliance with medication regimen [Z91.14]   . Hallucinations [R44.3] 07/07/2014  . Paranoia [F22] 07/07/2014    Total Time spent with patient: 45 minutes  Subjective:   [name] is a 44 y.o. male patient admitted with suicidal thoughts and alcohol withdrawal.  HPI:  [name] is an 44 y.o. male with history of PTSD, Alcohol, Schizoaffective Disorder, Anxiety, and Depression. He presents to [facility] with alcohol withdrawals and suicidal thoughts with plan to cut himself. Patient reports that his skin feels like "skin and needles". He feels nervous,  irritable, with hot flashes, and tremors. He has a history of dizziness, ataxia, black outs and seizures. His last seizure was several yrs ago. Patient states that he has been drinking alcohol since age 78, drinks about half pint daily, last drink was yesterday. Patient also reports occasional THC use. Sts that his last use was 1 week ago. Patient suicidal with plan to cut self or overdose.  Patient reporting previous suicide attempts (overdose and cutting). Patient self mutilates himself by cutting, has several self inflicted  (superficial) cuts. Patient denies HI. He reports auditory hallucinations of, "People laughing at me and telling me belittling things about myself". He also has visual hallucinations of "Shadow People". Patient hospitalized at [facility] multiple times, [place], [facility], and facilities outside of the this state. He currently receives outpatient services at [facility]. He sis requesting for detox  HPI Elements:   Location:  suicidal thought, alcohol withdrawal. Quality:  severe. Duration:  1 day. Context:  poor compliance with treatment and alcohol abuse.  Past Medical History:  Past Medical History  Diagnosis Date  . Psychosis   . PTSD (post-traumatic stress disorder)   . Seizure disorder     related to etoh seizure  . Alcohol abuse   . Schizoaffective disorder   . Anxiety     at age 50  . Depression     at age 28    Past Surgical History  Procedure Laterality Date  . Foot surgery      right  . Hand surgery     Family History:  Family History  Problem Relation Age of Onset  . Alcoholism Mother   . CAD Father   . Depression Brother    Social History:  History  Alcohol Use No    Comment: previously 6-8 (24 oz) cans beer per day      History  Drug Use  . 1.00 per week  . Special: Marijuana    Comment: THC 2 to 3 times per month    History   Social History  . Marital Status: Single    Spouse Name: N/A  . Number of Children: N/A  . Years of Education: N/A   Social History Main Topics  .  Smoking status: Current Every Day Smoker -- 1.50 packs/day for 24 years    Types: Cigarettes  . Smokeless tobacco: Never Used  . Alcohol Use: No     Comment: previously 6-8 (24 oz) cans beer per day   . Drug Use: 1.00 per week    Special: Marijuana     Comment: THC 2 to 3 times per month  . Sexual Activity: Yes   Other Topics Concern  . None   Social History Narrative   Additional Social History:    Pain Medications: No prescription pan meds  Prescriptions: Seroquel, Lithium, Zoloft,  and Gabapentin Over the Counter: Naproxen for pain  History of alcohol / drug use?: Yes Withdrawal Symptoms: Sweats, Tingling, Blackouts, Fever / Chills, Seizures Onset of Seizures: 44 yrs old  Date of most recent seizure: "Couple of years ago that I know of" and "I've had alot of falls lately so I don't know if those are seizurs too" Name of Substance 1: Alcohol  1 - Age of First Use: 44 yrs old  1 - Amount (size/oz): "Alcohol binge"; 4-8 tall beers per day  1 - Frequency: "I binge drink and I've been drinking daily since 10/19/2014" 1 - Duration: I have been drinking since 11/16/2014 1 - Last Use / Amount: "last night or this morning" Name of Substance 2: THC 2 - Age of First Use: 44 yrs old  2 - Amount (size/oz): Ccouple of bowls" 2 - Frequency: 1-2x's per month  2 - Duration: on-going  2 - Last Use / Amount: 1 week ago                 Allergies:   Allergies  Allergen Reactions  . Wellbutrin [Bupropion] Other (See Comments)    Makes me feel like I'm have a seizure    Labs:  Results for orders placed or performed during the hospital encounter of 11/28/14 (from the past 48 hour(s))  Acetaminophen level     Status: Abnormal   Collection Time: 11/28/14 11:55 AM  Result Value Ref Range   Acetaminophen (Tylenol), Serum <10.0 (L) 10 - 30 ug/mL    Comment:        THERAPEUTIC CONCENTRATIONS VARY SIGNIFICANTLY. A RANGE OF 10-30 ug/mL MAY BE AN EFFECTIVE CONCENTRATION FOR MANY PATIENTS. HOWEVER, SOME ARE BEST TREATED AT CONCENTRATIONS OUTSIDE THIS RANGE. ACETAMINOPHEN CONCENTRATIONS >150 ug/mL AT 4 HOURS AFTER INGESTION AND >50 ug/mL AT 12 HOURS AFTER INGESTION ARE OFTEN ASSOCIATED WITH TOXIC REACTIONS.   CBC     Status: Abnormal   Collection Time: 11/28/14 11:55 AM  Result Value Ref Range   WBC 5.0 4.0 - 10.5 K/uL   RBC 4.58 4.22 - 5.81 MIL/uL   Hemoglobin 15.7 13.0 - 17.0 g/dL   HCT 45.1 39.0 - 52.0 %   MCV 98.5 78.0 - 100.0 fL   MCH 34.3 (H) 26.0 - 34.0 pg    MCHC 34.8 30.0 - 36.0 g/dL   RDW 16.4 (H) 11.5 - 15.5 %   Platelets 307 150 - 400 K/uL  Comprehensive metabolic panel     Status: Abnormal   Collection Time: 11/28/14 11:55 AM  Result Value Ref Range   Sodium 138 135 - 145 mmol/L   Potassium 3.9 3.5 - 5.1 mmol/L   Chloride 91 (L) 96 - 112 mmol/L   CO2 28 19 - 32 mmol/L   Glucose, Bld 104 (H) 70 - 99 mg/dL   BUN 7 6 - 23 mg/dL   Creatinine, Ser 0.81 0.50 -  1.35 mg/dL   Calcium 9.3 8.4 - 10.5 mg/dL   Total Protein 8.5 (H) 6.0 - 8.3 g/dL   Albumin 5.1 3.5 - 5.2 g/dL   AST 68 (H) 0 - 37 U/L   ALT 65 (H) 0 - 53 U/L   Alkaline Phosphatase 121 (H) 39 - 117 U/L   Total Bilirubin 0.5 0.3 - 1.2 mg/dL   GFR calc non Af Amer >90 >90 mL/min   GFR calc Af Amer >90 >90 mL/min    Comment: (NOTE) The eGFR has been calculated using the CKD EPI equation. This calculation has not been validated in all clinical situations. eGFR's persistently <90 mL/min signify possible Chronic Kidney Disease.    Anion gap 19 (H) 5 - 15  Ethanol (ETOH)     Status: Abnormal   Collection Time: 11/28/14 11:55 AM  Result Value Ref Range   Alcohol, Ethyl (B) 289 (H) 0 - 9 mg/dL    Comment:        LOWEST DETECTABLE LIMIT FOR SERUM ALCOHOL IS 11 mg/dL FOR MEDICAL PURPOSES ONLY   Salicylate level     Status: None   Collection Time: 11/28/14 11:55 AM  Result Value Ref Range   Salicylate Lvl <5.9 2.8 - 20.0 mg/dL  Urine Drug Screen     Status: Abnormal   Collection Time: 11/28/14 12:12 PM  Result Value Ref Range   Opiates NONE DETECTED NONE DETECTED   Cocaine NONE DETECTED NONE DETECTED   Benzodiazepines NONE DETECTED NONE DETECTED   Amphetamines NONE DETECTED NONE DETECTED   Tetrahydrocannabinol POSITIVE (A) NONE DETECTED   Barbiturates NONE DETECTED NONE DETECTED    Comment:        DRUG SCREEN FOR MEDICAL PURPOSES ONLY.  IF CONFIRMATION IS NEEDED FOR ANY PURPOSE, NOTIFY LAB WITHIN 5 DAYS.        LOWEST DETECTABLE LIMITS FOR URINE DRUG SCREEN Drug Class        Cutoff (ng/mL) Amphetamine      1000 Barbiturate      200 Benzodiazepine   935 Tricyclics       701 Opiates          300 Cocaine          300 THC              50     Vitals: Blood pressure 135/83, pulse 88, temperature 98.9 F (37.2 C), temperature source Oral, resp. rate 18, SpO2 94 %.  Risk to Self: Suicidal Ideation: Yes-Currently Present ("On and off for 20 yrs") Suicidal Intent: Yes-Currently Present Is patient at risk for suicide?: Yes Suicidal Plan?: Yes-Currently Present Specify Current Suicidal Plan:  ("I was going to cut myself"..pt has cuts on arm as we speak ) Access to Means: Yes Specify Access to Suicidal Means:  (sharp objects ) What has been your use of drugs/alcohol within the last 12 months?:  (patient reports alcohol and THC) How many times?:  (Yes: "Few times; cutting and OD (last time was 5 yrs ago)) Other Self Harm Risks:  (cutting ) Triggers for Past Attempts: Other (Comment) (OD-"My fiance killed herself in front of me";cutting-depress) Intentional Self Injurious Behavior: Cutting Comment - Self Injurious Behavior:  (Patient has superficial cuts on arm-"A little over a month a) Risk to Others: Homicidal Ideation: No Thoughts of Harm to Others: No Current Homicidal Intent: No Current Homicidal Plan: No Access to Homicidal Means: No Identified Victim:  (n/a) History of harm to others?: No Assessment of Violence: None Noted Violent  Behavior Description:  (patient is calm and cooperative ) Does patient have access to weapons?: No Criminal Charges Pending?: No Does patient have a court date: No Prior Inpatient Therapy: Prior Inpatient Therapy: Yes Prior Therapy Dates:  [facility] [place], [place], [facility], & facilities in other stat) Prior Therapy Facilty/Provider(s):  ([facility]-"Multiple admissions"; [place], facilities in other states) Reason for Treatment:  (schizoaffective disorder, anxiety, med management, suicidal) Prior Outpatient Therapy: Prior  Outpatient Therapy: Yes Prior Therapy Dates:  (current) Prior Therapy Facilty/Provider(s):  [facility] ) Reason for Treatment:  (med management)  Current Facility-Administered Medications  Medication Dose Route Frequency Provider Last Rate Last Dose  . alum & mag hydroxide-simeth (MAALOX/MYLANTA) 200-200-20 MG/5ML suspension 30 mL  30 mL Oral PRN Margarita Mail, PA-C      . ARIPiprazole (ABILIFY) tablet 5 mg  5 mg Oral Daily Dondre Catalfamo      . gabapentin (NEURONTIN) capsule 300 mg  300 mg Oral BID Reubin Bushnell      . ibuprofen (ADVIL,MOTRIN) tablet 600 mg  600 mg Oral Q8H PRN Margarita Mail, PA-C   600 mg at 11/28/14 1518  . LORazepam (ATIVAN) injection 0-4 mg  0-4 mg Intravenous 4 times per day Alvina Chou, PA-C   0 mg at 11/28/14 1316  . LORazepam (ATIVAN) injection 0-4 mg  0-4 mg Intravenous Q12H Kaitlyn Szekalski, PA-C   0 mg at 11/28/14 1315  . LORazepam (ATIVAN) tablet 0-4 mg  0-4 mg Oral 4 times per day Alvina Chou, PA-C   1 mg at 11/29/14 0528  . LORazepam (ATIVAN) tablet 0-4 mg  0-4 mg Oral Q12H Kaitlyn Szekalski, PA-C   0 mg at 11/28/14 1316  . nicotine (NICODERM CQ - dosed in mg/24 hours) patch 21 mg  21 mg Transdermal Daily Margarita Mail, PA-C   21 mg at 11/29/14 1035  . ondansetron (ZOFRAN) tablet 4 mg  4 mg Oral Q8H PRN Margarita Mail, PA-C   4 mg at 11/28/14 1313  . sertraline (ZOLOFT) tablet 100 mg  100 mg Oral Daily Cherrie Franca      . thiamine (VITAMIN B-1) tablet 100 mg  100 mg Oral Daily Margarita Mail, PA-C   100 mg at 11/28/14 1312   Or  . thiamine (B-1) injection 100 mg  100 mg Intravenous Daily Margarita Mail, PA-C      . thiamine (B-1) injection 100 mg  100 mg Intravenous Daily Kaitlyn Szekalski, PA-C   0 mg at 11/28/14 1316  . thiamine (VITAMIN B-1) tablet 100 mg  100 mg Oral Daily Kaitlyn Szekalski, PA-C   100 mg at 11/29/14 1009  . traZODone (DESYREL) tablet 100 mg  100 mg Oral QHS PRN Lochlan Grygiel       Current Outpatient Prescriptions   Medication Sig Dispense Refill  . gabapentin (NEURONTIN) 300 MG capsule Take 1 capsule (300 mg total) by mouth at bedtime. For agitation 30 capsule 0  . lithium carbonate 300 MG capsule Take 1 capsule (300 mg total) by mouth 2 (two) times daily with a meal. For mood stabilization 30 capsule 0  . naproxen sodium (ANAPROX) 220 MG tablet Take 1 tablet (220 mg total) by mouth daily as needed (pain).    . QUEtiapine (SEROQUEL) 400 MG tablet Take 400 mg by mouth at bedtime.    . sertraline (ZOLOFT) 100 MG tablet Take 100 mg by mouth daily.      Musculoskeletal: Strength & Muscle Tone: within normal limits Gait & Station: unsteady Patient leans: Backward  Psychiatric Specialty Exam: Physical  Exam  Psychiatric: His speech is normal. His mood appears anxious. He is slowed and withdrawn. Cognition and memory are normal. He expresses impulsivity. He exhibits a depressed mood. He expresses suicidal ideation.    Review of Systems  Constitutional: Positive for malaise/fatigue.  HENT: Negative.   Eyes: Negative.   Respiratory: Negative.   Cardiovascular: Negative.   Genitourinary: Negative.   Musculoskeletal: Positive for myalgias.  Skin: Negative.   Neurological: Positive for weakness.  Endo/Heme/Allergies: Negative.   Psychiatric/Behavioral: Positive for depression, suicidal ideas and substance abuse. The patient is nervous/anxious.     Blood pressure 135/83, pulse 88, temperature 98.9 F (37.2 C), temperature source Oral, resp. rate 18, SpO2 94 %.There is no weight on file to calculate BMI.  General Appearance: Disheveled  Eye Contact::  Good  Speech:  Clear and Coherent  Volume:  Decreased  Mood:  Depressed and Hopeless  Affect:  Constricted  Thought Process:  Goal Directed  Orientation:  Full (Time, Place, and Person)  Thought Content:  Delusions and Hallucinations: Auditory Visual  Suicidal Thoughts:  Yes.  with intent/plan  Homicidal Thoughts:  No  Memory:  Immediate;    Fair Recent;   Fair Remote;   Fair  Judgement:  Impaired  Insight:  Shallow  Psychomotor Activity:  Decreased  Concentration:  Fair  Recall:  Good  Fund of Knowledge:Good  Language: Good  Akathisia:  No  Handed:  Right  AIMS (if indicated):     Assets:  Communication Skills Desire for Improvement Physical Health  ADL's:  Intact  Cognition: WNL  Sleep:      Medical Decision Making: Established Problem, Worsening (2), Review of Medication Regimen & Side Effects (2) and Review of New Medication or Change in Dosage (2)  Treatment Plan Summary: Daily contact with patient to assess and evaluate symptoms and progress in treatment and Medication management  Plan:  Recommend psychiatric Inpatient admission when medically cleared. Disposition: Needs inpatient admission for stabilization  Corena Pilgrim, MD 11/29/2014 11:22 AM

## 2014-11-30 ENCOUNTER — Telehealth: Payer: Self-pay | Admitting: *Deleted

## 2014-11-30 ENCOUNTER — Encounter (HOSPITAL_COMMUNITY): Payer: Self-pay | Admitting: Psychiatry

## 2014-11-30 DIAGNOSIS — F4312 Post-traumatic stress disorder, chronic: Secondary | ICD-10-CM

## 2014-11-30 DIAGNOSIS — F1210 Cannabis abuse, uncomplicated: Secondary | ICD-10-CM

## 2014-11-30 DIAGNOSIS — F1020 Alcohol dependence, uncomplicated: Secondary | ICD-10-CM

## 2014-11-30 MED ORDER — ZOLPIDEM TARTRATE 10 MG PO TABS
10.0000 mg | ORAL_TABLET | Freq: Every evening | ORAL | Status: DC | PRN
  Administered 2014-11-30 – 2014-12-05 (×6): 10 mg via ORAL
  Filled 2014-11-30 (×6): qty 1

## 2014-11-30 MED ORDER — NICOTINE 21 MG/24HR TD PT24
21.0000 mg | MEDICATED_PATCH | Freq: Every day | TRANSDERMAL | Status: DC
  Administered 2014-11-30 – 2014-12-06 (×7): 21 mg via TRANSDERMAL
  Filled 2014-11-30 (×11): qty 1

## 2014-11-30 MED ORDER — IBUPROFEN 600 MG PO TABS
600.0000 mg | ORAL_TABLET | Freq: Four times a day (QID) | ORAL | Status: DC | PRN
  Administered 2014-11-30 – 2014-12-03 (×3): 600 mg via ORAL
  Filled 2014-11-30 (×3): qty 1

## 2014-11-30 MED ORDER — GABAPENTIN 400 MG PO CAPS
400.0000 mg | ORAL_CAPSULE | Freq: Three times a day (TID) | ORAL | Status: DC
  Administered 2014-11-30 – 2014-12-06 (×19): 400 mg via ORAL
  Filled 2014-11-30 (×2): qty 1
  Filled 2014-11-30: qty 42
  Filled 2014-11-30 (×11): qty 1
  Filled 2014-11-30: qty 42
  Filled 2014-11-30 (×3): qty 1
  Filled 2014-11-30: qty 42
  Filled 2014-11-30 (×9): qty 1

## 2014-11-30 NOTE — [facility] Suicide Risk Assessment (Signed)
[facility] INPATIENT:  Family/Significant Other Suicide Prevention Education  Suicide Prevention Education:  Education Completed; mother Cher Nakai [phone],  (name of family member/significant other) has been identified by the patient as the family member/significant other with whom the patient will be residing, and identified as the person(s) who will aid the patient in the event of a mental health crisis (suicidal ideations/suicide attempt).  With written consent from the patient, the family member/significant other has been provided the following suicide prevention education, prior to the and/or following the discharge of the patient.  The suicide prevention education provided includes the following:  Suicide risk factors  Suicide prevention and interventions  National Suicide Hotline telephone number  [facility] assessment telephone number  [facility] Emergency Assistance [place] and/or Residential Mobile Crisis Unit telephone number  Request made of family/significant other to:  Remove weapons (e.g., guns, rifles, knives), all items previously/currently identified as safety concern.    Remove drugs/medications (over-the-counter, prescriptions, illicit drugs), all items previously/currently identified as a safety concern.  The family member/significant other verbalizes understanding of the suicide prevention education information provided.  The family member/significant other agrees to remove the items of safety concern listed above.  Tajae Maiolo, [name] 11/30/2014, 3:45 PM

## 2014-11-30 NOTE — Clinical Social Work Note (Signed)
CSW left voicemail for patient's mother Cher Nakai [phone]. Awaiting return call.  Tilden Fossa, MSW, [place] [facility] Worker [facility] [phone]

## 2014-11-30 NOTE — Telephone Encounter (Signed)
[facility] called to let PCP know that the patient is currently admitted at their facility. Please f/u for any questions.

## 2014-11-30 NOTE — [facility] Suicide Risk Assessment (Signed)
[facility] Admission Suicide Risk Assessment   Nursing information obtained from:  Patient Demographic factors:  Male, Divorced or widowed, Caucasian, Living alone, Unemployed Current Mental Status:  Suicidal ideation indicated by patient Loss Factors:  Loss of significant relationship Historical Factors:  Prior suicide attempts Risk Reduction Factors:  Positive social support Total Time spent with patient: 45 minutes Principal Problem: Schizoaffective disorder Diagnosis:   Patient Active Problem List   Diagnosis Date Noted  . Schizoaffective disorder [F25.9] 12/29/2013    Priority: High  . Alcohol use disorder, severe, dependence [F10.20] 11/29/2014  . Current smoker [Z72.0] 11/20/2014  . Tremors [G25.2] 11/03/2014  . Chronic posttraumatic stress disorder [F43.12]   . Protein-calorie malnutrition, severe [E43] 10/19/2014  . Hallucinations, visual [R44.1]   . Noncompliance with medication regimen [Z91.14]   . Hallucinations [R44.3] 07/07/2014  . Paranoia [F22] 07/07/2014  . Cannabis abuse [F12.10] 12/30/2013  . Post traumatic stress disorder (PTSD) [F43.10] 11/03/2011     Continued Clinical Symptoms:  Alcohol Use Disorder Identification Test Final Score (AUDIT): 20 The "Alcohol Use Disorders Identification Test", Guidelines for Use in Primary Care, Second Edition.  World Pharmacologist [facility]). Score between 0-7:  no or low risk or alcohol related problems. Score between 8-15:  moderate risk of alcohol related problems. Score between 16-19:  high risk of alcohol related problems. Score 20 or above:  warrants further diagnostic evaluation for alcohol dependence and treatment.   CLINICAL FACTORS:   Severe Anxiety and/or Agitation Alcohol/Substance Abuse/Dependencies Currently Psychotic  Psychiatric Specialty Exam: Physical Exam  ROS  Blood pressure 122/89, pulse 100, temperature 98.6 F (37 C), temperature source Oral, resp. rate 18, height 5\' 10"  (1.778 m), weight 87.998 kg  (194 lb).Body mass index is 27.84 kg/(m^2).   COGNITIVE FEATURES THAT CONTRIBUTE TO RISK:  Closed-mindedness, Polarized thinking and Thought constriction (tunnel vision)    SUICIDE RISK:   Moderate:  Frequent suicidal ideation with limited intensity, and duration, some specificity in terms of plans, no associated intent, good self-control, limited dysphoria/symptomatology, some risk factors present, and identifiable protective factors, including available and accessible social support.  PLAN OF CARE: Supportive approach/coping skills/relapse prevention                               Alcohol Dependence: will detox with Ativan                               Psychotic symptoms will D/C the Seroquel try Abilify having Geodon as as option                              PTSD; look at starting Prazosin                               Mood component: will D/C the Zoloft and reassess, will increse the Neurontin to 400 mg TID  Medical Decision Making:  Review of Psycho-Social Stressors (1), Review or order clinical lab tests (1), Review of Medication Regimen & Side Effects (2) and Review of New Medication or Change in Dosage (2)  I certify that inpatient services furnished can reasonably be expected to improve the patient's condition.   Alderson A 11/30/2014, 12:32 PM
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
## 2014-11-30 NOTE — Progress Notes (Signed)
Patient ID: [name], male   DOB: 1971-05-17, 44 y.o.   MRN: [id number] PER STATE REGULATIONS 482.30  THIS CHART WAS REVIEWED FOR MEDICAL NECESSITY WITH RESPECT TO THE PATIENT'S ADMISSION/DURATION OF STAY.  NEXT REVIEW DATE: 12/03/14  Roma Schanz, RN, BSN CASE MANAGER

## 2014-11-30 NOTE — Progress Notes (Addendum)
Pt endorses a suicidal plan of using a knife. 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 " This is the end for me". Pt currently lives alone.     Pt reports Seroquel as ineffective for his AVH.

## 2014-11-30 NOTE — BHH Group Notes (Signed)
[facility] LCSW Group Therapy 11/30/2014 1:15 PM Type of Therapy: Group Therapy Participation Level: Active  Participation Quality: Attentive, Sharing and Supportive  Affect: Depressed and Flat  Cognitive: Alert and Oriented  Insight: Developing/Improving and Engaged  Engagement in Therapy: Developing/Improving and Engaged  Modes of Intervention: Activity, Clarification, Confrontation, Discussion, Education, Exploration, Limit-setting, Orientation, Problem-solving, Rapport Building, Art therapist, Socialization and Support  Summary of Progress/Problems: Patient was attentive and engaged with speaker from [facility]. Patient was attentive to speaker while they shared their story of dealing with mental health and overcoming it. Patient expressed interest in their programs and services and received information on their agency. Patient processed ways they can relate to the speaker.   Tilden Fossa, MSW, [place] Worker [facility] [phone]

## 2014-11-30 NOTE — Progress Notes (Signed)
NUTRITION ASSESSMENT  Pt identified as at risk on the Malnutrition Screen Tool  INTERVENTION: 1. Educated patient on the importance of nutrition and encouraged intake of food and beverages. 2. Discussed weight goals. 3. Supplements: none  NUTRITION DIAGNOSIS: Unintentional weight loss related to sub-optimal intake as evidenced by pt report.   Goal: Pt to meet >/= 90% of their estimated nutrition needs.  Monitor:  PO intake  Assessment:  Pt reports a poor appetite over the past two weeks. His usual body weight is ~210 lbs- this reflects a 16 lb wt loss. He reports an increase in appetite for the past 24 hours. He said that he ate a good breakfast this morning and is hungry for lunch. Denied the need for nutritional supplements  44 y.o. male  Height: Ht Readings from Last 1 Encounters:  11/29/14 5\' 10"  (1.778 m)    Weight: Wt Readings from Last 1 Encounters:  11/29/14 194 lb (87.998 kg)    Weight Hx: Wt Readings from Last 10 Encounters:  11/29/14 194 lb (87.998 kg)  11/20/14 195 lb (88.451 kg)  11/03/14 204 lb (92.534 kg)  11/03/14 204 lb (92.534 kg)  11/02/14 204 lb (92.534 kg)  10/24/14 198 lb (89.812 kg)  05/04/14 185 lb (83.915 kg)  02/09/14 182 lb (82.555 kg)  12/29/13 200 lb (90.719 kg)  12/13/13 210 lb (95.255 kg)    BMI:  Body mass index is 27.84 kg/(m^2). Pt meets criteria for overweight based on current BMI.  Estimated Nutritional Needs: Kcal: 25-30 kcal/kg Protein: > 1 gram protein/kg Fluid: 1 ml/kcal  Diet Order: Diet Heart Room service appropriate?: Yes; Fluid consistency:: Thin Pt is also offered choice of unit snacks mid-morning and mid-afternoon.  Pt is eating as desired.   Lab results and medications reviewed.   Laurette Schimke [place], [place], [place]

## 2014-11-30 NOTE — Progress Notes (Signed)
Adult Psychoeducational Group Note  Date:  11/30/2014 Time: 09:15am  Group Topic/Focus:  Orientation:   The focus of this group is to educate the patient on the purpose and policies of crisis stabilization and provide a format to answer questions about their admission.  The group details unit policies and expectations of patients while admitted. Self Esteem Action Plan:   The focus of this group is to help patients create a plan to continue to build self-esteem after discharge.  Participation Level:  Minimal  Participation Quality:  Monopolizing and Redirectable  Affect:  Anxious and Flat  Cognitive:  Lacking  Insight: Limited  Engagement in Group:  Limited, Monopolizing and Resistant  Modes of Intervention:  Activity, Discussion, Education, Orientation and Support  Additional Comments:  Pt not able to identify any positive aspects of self or recovery.   [name] 11/30/2014, 10:27 AM

## 2014-11-30 NOTE — Progress Notes (Signed)
Patient ID: [name], male   DOB: June 22, 1971, 44 y.o.   MRN: [id number]   Pt currently presents with a flat affect and anxious behavior. Per self inventory, pt rates depression, hopelessness and anxiety at a 10. Pt's daily goal is to "to actually care if I live or die" and they intend to do so by "stay here." Pt writes "I am hearing voices and seeing "shadow people" and light hurts my eyes." Pt reports poor sleep, poor concentration and a fair appetite. Pt reports in am group, "I think I need to be on a different hall, I'm using the alcohol due to the fact that I can see and hear things others cannot."  Pt provided with medications per providers orders. Pt's labs and vitals were monitored throughout the day. Pt supported emotionally and encouraged to express concerns and questions. Pt educated on medications. Provider notified of pt's AVH endorsement and request to be moved.   Pt's safety ensured with 15 minute and environmental checks. Pt currently denies HI. Pt endorses A/V hallucinations and passive SI. Pt verbally agrees to seek staff if SI/HI or A/VH occurs and to consult with staff before acting on these thoughts. Pt more lucid and less irritable throughout the day today. Pt seen in dayroom interacting with peers.

## 2014-11-30 NOTE — H&P (Signed)
Psychiatric Admission Assessment Adult  Patient Identification: [name] MRN:  [id number] Date of Evaluation:  11/30/2014 Chief Complaint:  SCHIZOAFFECTIVE  ETOH USE DISORDER Principal Diagnosis: <principal problem not specified> Diagnosis:   Patient Active Problem List   Diagnosis Date Noted  . Schizoaffective disorder [F25.9] 12/29/2013    Priority: High  . Alcohol use disorder, severe, dependence [F10.20] 11/29/2014  . Bipolar affective disorder, depressed, severe [F31.4] 11/29/2014  . Current smoker [Z72.0] 11/20/2014  . Tremors [G25.2] 11/03/2014  . Chronic posttraumatic stress disorder [F43.12]   . Protein-calorie malnutrition, severe [E43] 10/19/2014  . Hallucinations, visual [R44.1]   . Noncompliance with medication regimen [Z91.14]   . Hallucinations [R44.3] 07/07/2014  . Paranoia [F22] 07/07/2014  . Cannabis abuse [F12.10] 12/30/2013  . Post traumatic stress disorder (PTSD) [F43.10] 11/03/2011   History of Present Illness:: 44 Y/O male who states: "I almost killed my self." " I need something stronger for my Schizoaffective, I started self medicating again." states he has real sharp knives at home as he used to be a Biomedical scientist, was going to cut his hand and his sister stop him. States the voices, the paranoia, the seeing things...got overwhelming. States he does not think he can do it anymore. He was admitted last time March 8 to the 13. States he thought he was better. As time went on his symptoms got worst. States he fell off a flight of stairs but he cant identify any particular stressor. Started drinking again in the last couple of weeks. States he has been drinking 12 pack a day every day in binges.  Initial assessment is as follows: [name] is an 44 y.o. male with history of PTSD, Alcohol, Schizoaffective Disorder, Anxiety, and Depression. Patient presents to [facility] with alcohol withdrawals. Patient reports that his skin feels like "skin and needles". He is also  irritable, with hot flashes, and tremors. He has a history of black outs and seizures. His last seizure was several yrs ago. Patient also reports occasional THC use. Sts that his last use was 1 week ago. Patient suicidal with plan to cut self or overdose. Patient identifies his withdrawals symptoms as a stressors. Patient reporting previous suicide attempts (overdose and cutting). Patient self mutilates himself by cutting. He shows this Probation officer several self inflicted (superficial) cuts. Patient denies HI. He reports auditory hallucinations of, "People laughing at me and telling me belittling things about myself". He also has visual hallucinations of "Shadow People". Patient hospitalized at [facility] multiple times, [facility], [facility], and facilities outside of the this state. He currently receives outpatient services at [facility].   Elements:  Location:  schizoaffective disorder, alcohol abuse. Quality:  psychotic symptoms got worst cant identify a trigger, he started drinking again "self medicate" was going to cut his hand when sister intervened. Severity:  severe. Timing:  every day. Duration:  building up worst last 2 weeks. Context:  Schizoaffective disorder with active psychotic symptoms relapsed on alcohol to cope. Associated Signs/Symptoms: Depression Symptoms:  depressed mood, anhedonia, insomnia, fatigue, feelings of worthlessness/guilt, difficulty concentrating, suicidal thoughts with specific plan, anxiety, panic attacks, insomnia, loss of energy/fatigue, disturbed sleep, weight loss, decreased appetite, has lost 15 pounds in the last two weeks (Hypo) Manic Symptoms:  Labiality of Mood, mostly depressed Anxiety Symptoms:  Excessive Worry, Panic Symptoms, Psychotic Symptoms:  Delusions, Hallucinations: Auditory Visual Paranoia, sees images the shadow people PTSD Symptoms: Had a traumatic exposure:  fiancee killed herself in front of him Re-experiencing:  Flashbacks Intrusive  Thoughts Nightmares Total Time spent  with patient: 45 minutes  Past Medical History:  Past Medical History  Diagnosis Date  . Psychosis   . PTSD (post-traumatic stress disorder)   . Seizure disorder     related to etoh seizure  . Alcohol abuse   . Schizoaffective disorder   . Anxiety     at age 39  . Depression     at age 67    Past Surgical History  Procedure Laterality Date  . Foot surgery      right  . Hand surgery     Family History:  Family History  Problem Relation Age of Onset  . Alcoholism Mother   . CAD Father   . Depression Brother   Depression anxiety alcoholism no psychotic disorders Social History:  History  Alcohol Use  . Yes    Comment: "I'm a binge drinker.[name] 4-40 ounces/day"     History  Drug Use  . 1.00 per week  . Special: Marijuana    Comment: THC 2 to 3 times per month    History   Social History  . Marital Status: Single    Spouse Name: N/A  . Number of Children: N/A  . Years of Education: N/A   Social History Main Topics  . Smoking status: Current Every Day Smoker -- 1.50 packs/day for 24 years    Types: Cigarettes  . Smokeless tobacco: Never Used  . Alcohol Use: Yes     Comment: "I'm a binge drinker.[name] 4-40 ounces/day"  . Drug Use: 1.00 per week    Special: Marijuana     Comment: THC 2 to 3 times per month  . Sexual Activity: Yes   Other Topics Concern  . None   Social History Narrative  Lives by himself, on disability. Has an AA degree associate degree. 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 States the people the chaos became too much, could not do it anymore. States he sees his brother and sister and his mother. Has a one daughter in [place] he stays in touch with. States he walks a lot.  Additional Social History:    Pain Medications:  (Denies) History of alcohol / drug use?: Yes Negative Consequences of Use: Financial, Personal relationships Withdrawal Symptoms: Anorexia, Cramps, Tingling, Tremors, Diarrhea Onset of  Seizures: "years ago with withdrawal" Date of most recent seizure: "years ago with withdrawal" Name of Substance 1: Alcohol 1 - Age of First Use: 44 years old 1 - Amount (size/oz): "4 40 ounces" 1 - Frequency: "I'm a binge drinker." Denies daily 1 - Duration: a few days this time 1 - Last Use / Amount: Monday Name of Substance 2: THC 2 - Age of First Use: 44 years old 2 - Frequency: "every so often" 2 - Last Use / Amount: 1 week ago                 Musculoskeletal: Strength & Muscle Tone: within normal limits Gait & Station: normal Patient leans: N/A  Psychiatric Specialty Exam: Physical Exam  Review of Systems  Constitutional: Positive for weight loss, malaise/fatigue and diaphoresis.  Eyes: Positive for blurred vision.  Respiratory: Positive for cough and shortness of breath.        Pack and a half a day  Cardiovascular: Negative.   Gastrointestinal: Positive for heartburn, nausea and diarrhea.  Genitourinary: Negative.   Musculoskeletal: Positive for myalgias, back pain and neck pain.  Skin: Negative.   Neurological: Positive for dizziness, tremors, weakness and headaches.  Endo/Heme/Allergies:  Negative.   Psychiatric/Behavioral: Positive for depression, suicidal ideas, hallucinations and substance abuse. The patient is nervous/anxious and has insomnia.     Blood pressure 122/89, pulse 100, temperature 98.6 F (37 C), temperature source Oral, resp. rate 18, height _0  (1.778 m), weight 87.998 kg (194 lb).Body mass index is 27.84 kg/(m^2).  General Appearance: Disheveled  Eye Contact::  Minimal  Speech:  Clear and Coherent, Slow and not spontaneous, reserved, guarded  Volume:  Decreased  Mood:  Anxious, Depressed and Dysphoric  Affect:  Restricted  Thought Process:  Coherent and Goal Directed  Orientation:  Full (Time, Place, and Person)  Thought Content:  Delusions, Hallucinations: Auditory, Ideas of Reference:   Paranoia and Paranoid Ideation  Suicidal  Thoughts:  Yes.  with intent/plan  Homicidal Thoughts:  No  Memory:  Immediate;   Fair Recent;   Fair Remote;   Fair  Judgement:  Fair  Insight:  Present  Psychomotor Activity:  Restlessness  Concentration:  Fair  Recall:  [employer] of Knowledge:Fair  Language: Fair  Akathisia:  No  Handed:  Right  AIMS (if indicated):     Assets:  Desire for Improvement Housing Social Support  ADL's:  Intact  Cognition: WNL  Sleep:  Number of Hours: 4.5   Risk to Self: Is patient at risk for suicide?: Yes Risk to Others:   Prior Inpatient Therapy:  [facility] Prior Outpatient Therapy:  [facility] last time Feb 4   Alcohol Screening: 1. How often do you have a drink containing alcohol?: 2 to 4 times a month 2. How many drinks containing alcohol do you have on a typical day when you are drinking?: 3 or 4 3. How often do you have six or more drinks on one occasion?: Monthly Preliminary Score: 3 4. How often during the last year have you found that you were not able to stop drinking once you had started?: Less than monthly 5. How often during the last year have you failed to do what was normally expected from you becasue of drinking?: Less than monthly 6. How often during the last year have you needed a first drink in the morning to get yourself going after a heavy drinking session?: Monthly 7. How often during the last year have you had a feeling of guilt of remorse after drinking?: Monthly 8. How often during the last year have you been unable to remember what happened the night before because you had been drinking?: Less than monthly 9. Have you or someone else been injured as a result of your drinking?: Yes, during the last year 10. Has a relative or friend or a doctor or another health worker been concerned about your drinking or suggested you cut down?: Yes, during the last year Alcohol Use Disorder Identification Test Final Score (AUDIT): 20 Brief Intervention: MD notified of score 20 or  above  Allergies:   Allergies  Allergen Reactions  . Wellbutrin [Bupropion] Other (See Comments)    Makes me feel like I'm have a seizure   Lab Results:  Results for orders placed or performed during the hospital encounter of 11/28/14 (from the past 48 hour(s))  Acetaminophen level     Status: Abnormal   Collection Time: 11/28/14 11:55 AM  Result Value Ref Range   Acetaminophen (Tylenol), Serum <10.0 (L) 10 - 30 ug/mL    Comment:        THERAPEUTIC CONCENTRATIONS VARY SIGNIFICANTLY. A RANGE OF 10-30 ug/mL MAY BE AN EFFECTIVE CONCENTRATION FOR MANY PATIENTS.  HOWEVER, SOME ARE BEST TREATED AT CONCENTRATIONS OUTSIDE THIS RANGE. ACETAMINOPHEN CONCENTRATIONS >150 ug/mL AT 4 HOURS AFTER INGESTION AND >50 ug/mL AT 12 HOURS AFTER INGESTION ARE OFTEN ASSOCIATED WITH TOXIC REACTIONS.   CBC     Status: Abnormal   Collection Time: 11/28/14 11:55 AM  Result Value Ref Range   WBC 5.0 4.0 - 10.5 K/uL   RBC 4.58 4.22 - 5.81 MIL/uL   Hemoglobin 15.7 13.0 - 17.0 g/dL   HCT 45.1 39.0 - 52.0 %   MCV 98.5 78.0 - 100.0 fL   MCH 34.3 (H) 26.0 - 34.0 pg   MCHC 34.8 30.0 - 36.0 g/dL   RDW 16.4 (H) 11.5 - 15.5 %   Platelets 307 150 - 400 K/uL  Comprehensive metabolic panel     Status: Abnormal   Collection Time: 11/28/14 11:55 AM  Result Value Ref Range   Sodium 138 135 - 145 mmol/L   Potassium 3.9 3.5 - 5.1 mmol/L   Chloride 91 (L) 96 - 112 mmol/L   CO2 28 19 - 32 mmol/L   Glucose, Bld 104 (H) 70 - 99 mg/dL   BUN 7 6 - 23 mg/dL   Creatinine, Ser 0.81 0.50 - 1.35 mg/dL   Calcium 9.3 8.4 - 10.5 mg/dL   Total Protein 8.5 (H) 6.0 - 8.3 g/dL   Albumin 5.1 3.5 - 5.2 g/dL   AST 68 (H) 0 - 37 U/L   ALT 65 (H) 0 - 53 U/L   Alkaline Phosphatase 121 (H) 39 - 117 U/L   Total Bilirubin 0.5 0.3 - 1.2 mg/dL   GFR calc non Af Amer >90 >90 mL/min   GFR calc Af Amer >90 >90 mL/min    Comment: (NOTE) The eGFR has been calculated using the CKD EPI equation. This calculation has not been validated in  all clinical situations. eGFR's persistently <90 mL/min signify possible Chronic Kidney Disease.    Anion gap 19 (H) 5 - 15  Ethanol (ETOH)     Status: Abnormal   Collection Time: 11/28/14 11:55 AM  Result Value Ref Range   Alcohol, Ethyl (B) 289 (H) 0 - 9 mg/dL    Comment:        LOWEST DETECTABLE LIMIT FOR SERUM ALCOHOL IS 11 mg/dL FOR MEDICAL PURPOSES ONLY   Salicylate level     Status: None   Collection Time: 11/28/14 11:55 AM  Result Value Ref Range   Salicylate Lvl <1.1 2.8 - 20.0 mg/dL  Urine Drug Screen     Status: Abnormal   Collection Time: 11/28/14 12:12 PM  Result Value Ref Range   Opiates NONE DETECTED NONE DETECTED   Cocaine NONE DETECTED NONE DETECTED   Benzodiazepines NONE DETECTED NONE DETECTED   Amphetamines NONE DETECTED NONE DETECTED   Tetrahydrocannabinol POSITIVE (A) NONE DETECTED   Barbiturates NONE DETECTED NONE DETECTED    Comment:        DRUG SCREEN FOR MEDICAL PURPOSES ONLY.  IF CONFIRMATION IS NEEDED FOR ANY PURPOSE, NOTIFY LAB WITHIN 5 DAYS.        LOWEST DETECTABLE LIMITS FOR URINE DRUG SCREEN Drug Class       Cutoff (ng/mL) Amphetamine      1000 Barbiturate      200 Benzodiazepine   155 Tricyclics       208 Opiates          300 Cocaine          300 THC  50    Current Medications: Current Facility-Administered Medications  Medication Dose Route Frequency Provider Last Rate Last Dose  . acetaminophen (TYLENOL) tablet 650 mg  650 mg Oral Q6H PRN Patrecia Pour, NP      . alum & mag hydroxide-simeth (MAALOX/MYLANTA) 200-200-20 MG/5ML suspension 30 mL  30 mL Oral Q4H PRN Patrecia Pour, NP      . ARIPiprazole (ABILIFY) tablet 5 mg  5 mg Oral Daily Patrecia Pour, NP   5 mg at 11/30/14 0803  . gabapentin (NEURONTIN) capsule 300 mg  300 mg Oral BID Patrecia Pour, NP   300 mg at 11/30/14 1505  . hydrOXYzine (ATARAX/VISTARIL) tablet 25 mg  25 mg Oral Q6H PRN Laverle Hobby, PA-C      . loperamide (IMODIUM) capsule 2-4 mg  2-4 mg  Oral PRN Laverle Hobby, PA-C      . LORazepam (ATIVAN) tablet 1 mg  1 mg Oral Q6H PRN Laverle Hobby, PA-C      . LORazepam (ATIVAN) tablet 1 mg  1 mg Oral QID Laverle Hobby, PA-C   1 mg at 11/30/14 6979   Followed by  . [START ON 12/01/2014] LORazepam (ATIVAN) tablet 1 mg  1 mg Oral TID Laverle Hobby, PA-C       Followed by  . [START ON 12/02/2014] LORazepam (ATIVAN) tablet 1 mg  1 mg Oral BID Laverle Hobby, PA-C       Followed by  . [START ON 12/04/2014] LORazepam (ATIVAN) tablet 1 mg  1 mg Oral Daily Spencer E Simon, PA-C      . magnesium hydroxide (MILK OF MAGNESIA) suspension 30 mL  30 mL Oral Daily PRN Patrecia Pour, NP      . multivitamin with minerals tablet 1 tablet  1 tablet Oral Daily Laverle Hobby, PA-C   1 tablet at 11/30/14 4801  . ondansetron (ZOFRAN-ODT) disintegrating tablet 4 mg  4 mg Oral Q6H PRN Laverle Hobby, PA-C      . sertraline (ZOLOFT) tablet 100 mg  100 mg Oral Daily Patrecia Pour, NP   100 mg at 11/30/14 0803  . thiamine (VITAMIN B-1) tablet 100 mg  100 mg Oral Daily Laverle Hobby, PA-C   100 mg at 11/30/14 6553  . traZODone (DESYREL) tablet 100 mg  100 mg Oral QHS PRN Patrecia Pour, NP   100 mg at 11/29/14 2157   PTA Medications: Prescriptions prior to admission  Medication Sig Dispense Refill Last Dose  . gabapentin (NEURONTIN) 300 MG capsule Take 1 capsule (300 mg total) by mouth at bedtime. For agitation 30 capsule 0 Past Week at Unknown time  . lithium carbonate 300 MG capsule Take 1 capsule (300 mg total) by mouth 2 (two) times daily with a meal. For mood stabilization 30 capsule 0 Past Week at Unknown time  . naproxen sodium (ANAPROX) 220 MG tablet Take 1 tablet (220 mg total) by mouth daily as needed (pain).   unknown  . QUEtiapine (SEROQUEL) 400 MG tablet Take 400 mg by mouth at bedtime.   Past Week at Unknown time  . sertraline (ZOLOFT) 100 MG tablet Take 100 mg by mouth daily.   Past Week at Unknown time    Previous Psychotropic  Medications: Yes Seroquel Zoloft Abilify Risperdal  ( twitch) Thorazine Haldol, Lithium Tegretol, Prozac, Wellbutrin   Substance Abuse History in the last 12 months:  Yes.      Consequences of Substance  Abuse: Legal Consequences:  2 DWI Blackouts:   Withdrawal Symptoms:   Diaphoresis Diarrhea Headaches Nausea Tremors  Results for orders placed or performed during the hospital encounter of 11/28/14 (from the past 72 hour(s))  Acetaminophen level     Status: Abnormal   Collection Time: 11/28/14 11:55 AM  Result Value Ref Range   Acetaminophen (Tylenol), Serum <10.0 (L) 10 - 30 ug/mL    Comment:        THERAPEUTIC CONCENTRATIONS VARY SIGNIFICANTLY. A RANGE OF 10-30 ug/mL MAY BE AN EFFECTIVE CONCENTRATION FOR MANY PATIENTS. HOWEVER, SOME ARE BEST TREATED AT CONCENTRATIONS OUTSIDE THIS RANGE. ACETAMINOPHEN CONCENTRATIONS >150 ug/mL AT 4 HOURS AFTER INGESTION AND >50 ug/mL AT 12 HOURS AFTER INGESTION ARE OFTEN ASSOCIATED WITH TOXIC REACTIONS.   CBC     Status: Abnormal   Collection Time: 11/28/14 11:55 AM  Result Value Ref Range   WBC 5.0 4.0 - 10.5 K/uL   RBC 4.58 4.22 - 5.81 MIL/uL   Hemoglobin 15.7 13.0 - 17.0 g/dL   HCT 45.1 39.0 - 52.0 %   MCV 98.5 78.0 - 100.0 fL   MCH 34.3 (H) 26.0 - 34.0 pg   MCHC 34.8 30.0 - 36.0 g/dL   RDW 16.4 (H) 11.5 - 15.5 %   Platelets 307 150 - 400 K/uL  Comprehensive metabolic panel     Status: Abnormal   Collection Time: 11/28/14 11:55 AM  Result Value Ref Range   Sodium 138 135 - 145 mmol/L   Potassium 3.9 3.5 - 5.1 mmol/L   Chloride 91 (L) 96 - 112 mmol/L   CO2 28 19 - 32 mmol/L   Glucose, Bld 104 (H) 70 - 99 mg/dL   BUN 7 6 - 23 mg/dL   Creatinine, Ser 0.81 0.50 - 1.35 mg/dL   Calcium 9.3 8.4 - 10.5 mg/dL   Total Protein 8.5 (H) 6.0 - 8.3 g/dL   Albumin 5.1 3.5 - 5.2 g/dL   AST 68 (H) 0 - 37 U/L   ALT 65 (H) 0 - 53 U/L   Alkaline Phosphatase 121 (H) 39 - 117 U/L   Total Bilirubin 0.5 0.3 - 1.2 mg/dL   GFR calc non Af Amer  >90 >90 mL/min   GFR calc Af Amer >90 >90 mL/min    Comment: (NOTE) The eGFR has been calculated using the CKD EPI equation. This calculation has not been validated in all clinical situations. eGFR's persistently <90 mL/min signify possible Chronic Kidney Disease.    Anion gap 19 (H) 5 - 15  Ethanol (ETOH)     Status: Abnormal   Collection Time: 11/28/14 11:55 AM  Result Value Ref Range   Alcohol, Ethyl (B) 289 (H) 0 - 9 mg/dL    Comment:        LOWEST DETECTABLE LIMIT FOR SERUM ALCOHOL IS 11 mg/dL FOR MEDICAL PURPOSES ONLY   Salicylate level     Status: None   Collection Time: 11/28/14 11:55 AM  Result Value Ref Range   Salicylate Lvl <4.0 2.8 - 20.0 mg/dL  Urine Drug Screen     Status: Abnormal   Collection Time: 11/28/14 12:12 PM  Result Value Ref Range   Opiates NONE DETECTED NONE DETECTED   Cocaine NONE DETECTED NONE DETECTED   Benzodiazepines NONE DETECTED NONE DETECTED   Amphetamines NONE DETECTED NONE DETECTED   Tetrahydrocannabinol POSITIVE (A) NONE DETECTED   Barbiturates NONE DETECTED NONE DETECTED    Comment:        DRUG SCREEN FOR MEDICAL PURPOSES  ONLY.  IF CONFIRMATION IS NEEDED FOR ANY PURPOSE, NOTIFY LAB WITHIN 5 DAYS.        LOWEST DETECTABLE LIMITS FOR URINE DRUG SCREEN Drug Class       Cutoff (ng/mL) Amphetamine      1000 Barbiturate      200 Benzodiazepine   701 Tricyclics       779 Opiates          300 Cocaine          300 THC              50     Observation Level/Precautions:  15 minute checks  Laboratory:  As per ED and Lipid profile, Hemoglobin A1C, prolactin  Psychotherapy:  Individual/group  Medications:  Ativan detox protocol/reassess the psychotropic regime, optimize response  Consultations:    Discharge Concerns:    Estimated LOS: 5-7 days  Other:     Psychological Evaluations: No  Alcohol Dependence: Ativan detox protocol/work a relapse prevention plan Psychotic symptoms: D/C the Seroquel [name] would like to try some other  medication) will start a trial with Abilify although will have Geodon as an alternative Mood component: will D/C the Zoloft and reassess for the use of another antidepressant. Will increase the Neurontin to 400 mg QID PTSD: reassess for the use of Prazosin Treatment Plan Summary: Daily contact with patient to assess and evaluate symptoms and progress in treatment and Medication management  Medical Decision Making:  Review of Psycho-Social Stressors (1), Review or order clinical lab tests (1), Review of Medication Regimen & Side Effects (2) and Review of New Medication or Change in Dosage (2)  I certify that inpatient services furnished can reasonably be expected to improve the patient's condition.   Sallie Staron A 4/14/20169:58 AM

## 2014-11-30 NOTE — Progress Notes (Signed)
Recreation Therapy Notes  Animal-Assisted Activity (AAA) Program Checklist/Progress Notes Patient Eligibility Criteria Checklist & Daily Group note for Rec Tx Intervention  Date: 04.14.2016 Time: 2:45pm Location: [address]   AAA/T Program Assumption of Risk Form signed by Patient/ or Parent Legal Guardian yes  Patient is free of allergies or sever asthma yes  Patient reports no fear of animals yes  Patient reports no history of cruelty to animals yes  Patient understands his/her participation is voluntary yes  Behavioral Response: Did not attend.   Laureen Ochs Baylin Cabal, LRT/CTRS  Lane Hacker 11/30/2014 4:51 PM

## 2014-11-30 NOTE — Progress Notes (Signed)
Patient did attend the evening karaoke group.  Pt was attentive and supportive but did not participate by singing a song.

## 2014-11-30 NOTE — Telephone Encounter (Signed)
-----   Message from Boykin Nearing, MD sent at 11/21/2014  9:22 AM EDT ----- Normal CMP, liver enzymes have normalized. Cholesterol panel with elevated total cholesterol, TGs, LDL.  Recommend low fat, low carb diet, regular exercise to improve lipid profile.   Aside: calculated 10 yr CVD risk is 10.8%, not in a statin benefit group.

## 2014-11-30 NOTE — Telephone Encounter (Signed)
Left voice message to return call 

## 2014-11-30 NOTE — Tx Team (Signed)
Interdisciplinary Treatment Plan Update (Adult)   Date: 11/30/2014   Time Reviewed: 8:44 AM  Progress in Treatment:  Attending groups: no-new to unit.  Participating in groups: no-new to unit.  Taking medication as prescribed: Yes  Tolerating medication: Yes  Family/Significant othe contact made: Not yet. SPE required   Patient understands diagnosis: Yes, AEB seeking treatment for ETOH detox, depression, SI, and for medication stabilization.  Discussing patient identified problems/goals with staff: Yes  Medical problems stabilized or resolved: Yes  Denies suicidal/homicidal ideation: Passive SI/able to contract for safety on the unit.  Patient has not harmed self or Others: Yes  New problem(s) identified:  Discharge Plan or Barriers: CSW assessing for appropriate referrals at this time. Pt currently in bed resting. PSA needed.  Additional comments:  [name] is an 44 y.o. male with history of PTSD, Alcohol, Schizoaffective Disorder, Anxiety, and Depression. Patient presents to [facility] with alcohol withdrawals. Patient reports that his skin feels like "skin and needles". He is also irritable, with hot flashes, and tremors. He has a history of black outs and seizures. His last seizure was several yrs ago. Patient also reports occasional THC use. Sts that his last use was 1 week ago. Patient suicidal with plan to cut self or overdose. Patient identifies his withdrawals symptoms as a stressors. Patient reporting previous suicide attempts (overdose and cutting). Patient self mutilates himself by cutting. He shows this Probation officer several self inflicted (superficial) cuts. Patient denies HI. He reports auditory hallucinations of, "People laughing at me and telling me belittling things about myself". He also has visual hallucinations of "Shadow People". Patient hospitalized at [facility] multiple times, [place], [facility], and facilities outside of the this state. He currently receives outpatient services at [facility].   Reason for Continuation of Hospitalization: Ativan taper-withdrawals Mood stabilization/depression/SI Medication management  Estimated length of stay: 3-5 days  For review of initial/current patient goals, please see plan of care.  Attendees:  Patient:    Family:    Physician: Carlton Adam MD 11/30/2014 8:44 AM   Nursing: Edison Simon RN 11/30/2014 8:44 AM   Clinical Social Worker [place], [place]  11/30/2014 8:44 AM   Other: Leana Gamer  11/30/2014 8:44 AM   Other: Gerline Legacy Nurse CM 11/30/2014 8:44 AM   Other: Hilda Lias, Community Care Coordinator  11/30/2014 8:44 AM   Other:    Scribe for Treatment Team:  [place] 11/30/2014 8:44 AM

## 2014-12-01 MED ORDER — ZIPRASIDONE HCL 40 MG PO CAPS
40.0000 mg | ORAL_CAPSULE | Freq: Two times a day (BID) | ORAL | Status: DC
  Administered 2014-12-01 – 2014-12-02 (×2): 40 mg via ORAL
  Filled 2014-12-01 (×4): qty 1

## 2014-12-01 MED ORDER — LORAZEPAM 1 MG PO TABS: 1.0000 mg | ORAL_TABLET | Freq: Four times a day (QID) | ORAL | Status: DC | PRN

## 2014-12-01 MED ORDER — GABAPENTIN 400 MG PO CAPS
400.0000 mg | ORAL_CAPSULE | Freq: Every day | ORAL | Status: DC
  Administered 2014-12-01 – 2014-12-05 (×5): 400 mg via ORAL
  Filled 2014-12-01 (×8): qty 1

## 2014-12-01 MED ORDER — LORAZEPAM 1 MG PO TABS
1.0000 mg | ORAL_TABLET | Freq: Four times a day (QID) | ORAL | Status: AC | PRN
  Administered 2014-12-01 – 2014-12-02 (×3): 1 mg via ORAL
  Filled 2014-12-01 (×3): qty 1

## 2014-12-01 NOTE — Progress Notes (Signed)
D Milen is seen OOB at the med window this morning. He presents with a flat, depressed affect. HE avoids eye contact. HE holds his head down. HE mumbles when he speaks.\   A He takes all medications as they are ordered and he completes his daily assessment and on it he writes he is having SI today ( but he cotnracts verbally with this nurse when he is questioned about this) and he rates his depression, hopelessness and anxiety ' 05/27/09", respectively. HE attends his groups this morning. HE demonstrates minimal engagement in his recovery as he is focused only on what medication he can take and does engage in learning or discussing new healthy behaviors.HE is encouraged by this nurse to pursue this and to identify what unhealthy behaviors are keeping him stuck. Pos reinforcement is offered.   R Safety is in place.

## 2014-12-01 NOTE — BHH Group Notes (Signed)
[place] LCSW Group Therapy 12/01/2014 1:15 PM Type of Therapy: Group Therapy Participation Level: Active  Participation Quality: Attentive, Sharing and Supportive  Affect: Depressed and Flat  Cognitive: Alert and Oriented  Insight: Developing/Improving and Engaged  Engagement in Therapy: Developing/Improving and Engaged  Modes of Intervention: Clarification, Confrontation, Discussion, Education, Exploration, Limit-setting, Orientation, Problem-solving, Rapport Building, Art therapist, Socialization and Support  Summary of Progress/Problems: The topic for today was feelings about relapse. Pt discussed what relapse prevention is to them and identified triggers that they are on the path to relapse. Pt processed their feeling towards relapse and was able to relate to peers. Pt discussed coping skills that can be used for relapse prevention. Patient identified his relapse behavior as isolation. Patient shared that he uses drugs and alcohol to alleviate his schizoaffective symptoms. Patient did report feeling more hopeful today than he has in the past and shared that he plans to follow up with the [place] at discharge. CSW and other group members provided patient with emotional support and encouragement.    [name], MSW, [place] Worker [facility] [phone]

## 2014-12-01 NOTE — Progress Notes (Signed)
D.  Pt anxious on approach, requested Ativan.  Pt positive for evening AA group but fell asleep due to Ativan he had received earlier.  Interacting appropriately with peers on the unit.  Endorses passive SI but contracts for safety on the unit.  Endorses some continued auditory hallucinations.  Denies HI at this time.  A.  Support and encouragement offered, medications given as ordered for anxiety.  R.  Pt remains safe on unit, will continue to monitor.

## 2014-12-01 NOTE — BHH Group Notes (Signed)
   [facility] LCSW Aftercare Discharge Planning Group Note  12/01/2014  8:45 AM   Participation Quality: Alert, Appropriate and Oriented  Mood/Affect: Depressed and Flat  Depression Rating: 10  Anxiety Rating: 10  Thoughts of Suicide: Pt endorses SI but can contract for safety  Will you contract for safety? Yes  Current AVH: Pt denies  Plan for Discharge/Comments: Pt attended discharge planning group and actively participated in group. CSW provided pt with today's workbook. Patient plans to return home to follow up with [facility] and [place].  Transportation Means: Assessing for transportation options  Supports: Patient identifies his family as supportive  Tilden Fossa, MSW, [place] Worker [employer] [phone]

## 2014-12-01 NOTE — Progress Notes (Signed)
[facility] MD Progress Note  12/01/2014 2:43 PM [name]  MRN:  [id number] Subjective:  [name] states he is still feeling very depressed, did not sleep well last night, still hears voices. He is willing to contract for safety but still endorses SI Principal Problem: Schizoaffective disorder Diagnosis:   Patient Active Problem List   Diagnosis Date Noted  . Schizoaffective disorder [F25.9] 12/29/2013    Priority: High  . Alcohol use disorder, severe, dependence [F10.20] 11/29/2014  . Current smoker [Z72.0] 11/20/2014  . Tremors [G25.2] 11/03/2014  . Chronic posttraumatic stress disorder [F43.12]   . Protein-calorie malnutrition, severe [E43] 10/19/2014  . Hallucinations, visual [R44.1]   . Noncompliance with medication regimen [Z91.14]   . Hallucinations [R44.3] 07/07/2014  . Paranoia [F22] 07/07/2014  . Cannabis abuse [F12.10] 12/30/2013  . Post traumatic stress disorder (PTSD) [F43.10] 11/03/2011   Total Time spent with patient: 30 minutes   Past Medical History:  Past Medical History  Diagnosis Date  . Psychosis   . PTSD (post-traumatic stress disorder)   . Seizure disorder     related to etoh seizure  . Alcohol abuse   . Schizoaffective disorder   . Anxiety     at age 75  . Depression     at age 4    Past Surgical History  Procedure Laterality Date  . Foot surgery      right  . Hand surgery     Family History:  Family History  Problem Relation Age of Onset  . Alcoholism Mother   . CAD Father   . Depression Brother    Social History:  History  Alcohol Use  . Yes    Comment: "I'm a binge drinker.[name] 4-40 ounces/day"     History  Drug Use  . 1.00 per week  . Special: Marijuana    Comment: THC 2 to 3 times per month    History   Social History  . Marital Status: Single    Spouse Name: N/A  . Number of Children: N/A  . Years of Education: N/A   Social History Main Topics  . Smoking status: Current Every Day Smoker -- 1.50 packs/day for 24 years     Types: Cigarettes  . Smokeless tobacco: Never Used  . Alcohol Use: Yes     Comment: "I'm a binge drinker.[name] 4-40 ounces/day"  . Drug Use: 1.00 per week    Special: Marijuana     Comment: THC 2 to 3 times per month  . Sexual Activity: Yes   Other Topics Concern  . None   Social History Narrative   Additional History:    Sleep: Poor  Appetite:  Poor   Assessment:   Musculoskeletal: Strength & Muscle Tone: within normal limits Gait & Station: normal Patient leans: N/A   Psychiatric Specialty Exam: Physical Exam  Review of Systems  Constitutional: Positive for malaise/fatigue.  HENT: Negative.   Eyes: Negative.   Respiratory: Negative.   Cardiovascular: Negative.   Gastrointestinal: Negative.   Genitourinary: Negative.   Musculoskeletal: Negative.   Skin: Negative.   Neurological: Positive for weakness.  Endo/Heme/Allergies: Negative.   Psychiatric/Behavioral: Positive for depression, suicidal ideas, hallucinations and substance abuse. The patient is nervous/anxious and has insomnia.     Blood pressure 128/88, pulse 78, temperature 98.1 F (36.7 C), temperature source Oral, resp. rate 18, height 5\' 10"  (1.778 m), weight 87.998 kg (194 lb).Body mass index is 27.84 kg/(m^2).  General Appearance: Disheveled  Eye Contact::  Minimal  Speech:  Clear  and Coherent and slow not spontaneous  Volume:  Decreased  Mood:  Depressed, anxious  Affect:  Depressed  Thought Process:  Coherent and Goal Directed  Orientation:  Full (Time, Place, and Person)  Thought Content:  symptoms events worries concerns  Suicidal Thoughts:  Yes.  without intent/plan  Homicidal Thoughts:  No  Memory:  Immediate;   Fair Recent;   Fair Remote;   Fair  Judgement:  Fair  Insight:  Present and Shallow  Psychomotor Activity:  Restlessness  Concentration:  Fair  Recall:  [employer] of Knowledge:Fair  Language: Fair  Akathisia:  No  Handed:  Right  AIMS (if indicated):     Assets:  Desire  for Improvement Housing Social Support  ADL's:  Intact  Cognition: WNL  Sleep:  Number of Hours: 4     Current Medications: Current Facility-Administered Medications  Medication Dose Route Frequency Provider Last Rate Last Dose  . alum & mag hydroxide-simeth (MAALOX/MYLANTA) 200-200-20 MG/5ML suspension 30 mL  30 mL Oral Q4H PRN Patrecia Pour, NP      . gabapentin (NEURONTIN) capsule 400 mg  400 mg Oral TID Nicholaus Bloom, MD   400 mg at 12/01/14 1202  . gabapentin (NEURONTIN) capsule 400 mg  400 mg Oral QHS Nicholaus Bloom, MD      . hydrOXYzine (ATARAX/VISTARIL) tablet 25 mg  25 mg Oral Q6H PRN Laverle Hobby, PA-C   25 mg at 11/30/14 1816  . ibuprofen (ADVIL,MOTRIN) tablet 600 mg  600 mg Oral Q6H PRN Kerrie Buffalo, NP   600 mg at 11/30/14 1816  . loperamide (IMODIUM) capsule 2-4 mg  2-4 mg Oral PRN Laverle Hobby, PA-C   2 mg at 11/30/14 1958  . LORazepam (ATIVAN) tablet 1 mg  1 mg Oral TID Nicholaus Bloom, MD   1 mg at 12/01/14 1202   Followed by  . [START ON 12/02/2014] LORazepam (ATIVAN) tablet 1 mg  1 mg Oral BID Nicholaus Bloom, MD       Followed by  . [START ON 12/04/2014] LORazepam (ATIVAN) tablet 1 mg  1 mg Oral Daily Nicholaus Bloom, MD      . LORazepam (ATIVAN) tablet 1 mg  1 mg Oral Q6H PRN Nicholaus Bloom, MD      . magnesium hydroxide (MILK OF MAGNESIA) suspension 30 mL  30 mL Oral Daily PRN Patrecia Pour, NP      . multivitamin with minerals tablet 1 tablet  1 tablet Oral Daily Laverle Hobby, PA-C   1 tablet at 12/01/14 0813  . nicotine (NICODERM CQ - dosed in mg/24 hours) patch 21 mg  21 mg Transdermal Daily Nicholaus Bloom, MD   21 mg at 12/01/14 2542  . ondansetron (ZOFRAN-ODT) disintegrating tablet 4 mg  4 mg Oral Q6H PRN Laverle Hobby, PA-C      . thiamine (VITAMIN B-1) tablet 100 mg  100 mg Oral Daily Laverle Hobby, PA-C   100 mg at 12/01/14 0813  . traZODone (DESYREL) tablet 100 mg  100 mg Oral QHS PRN Patrecia Pour, NP   100 mg at 11/29/14 2157  . ziprasidone  (GEODON) capsule 40 mg  40 mg Oral BID WC Nicholaus Bloom, MD      . zolpidem [facility]) tablet 10 mg  10 mg Oral QHS PRN Nicholaus Bloom, MD   10 mg at 11/30/14 2138    Lab Results: No results found for this or  any previous visit (from the past 48 hour(s)).  Physical Findings: AIMS: Facial and Oral Movements Muscles of Facial Expression: None, normal Lips and Perioral Area: None, normal Jaw: None, normal Tongue: None, normal,Extremity Movements Upper (arms, wrists, hands, fingers): None, normal Lower (legs, knees, ankles, toes): None, normal, Trunk Movements Neck, shoulders, hips: None, normal, Overall Severity Severity of abnormal movements (highest score from questions above): None, normal Incapacitation due to abnormal movements: None, normal Patient's awareness of abnormal movements (rate only patient's report): No Awareness, Dental Status Current problems with teeth and/or dentures?: No Does patient usually wear dentures?: No  CIWA:  CIWA-Ar Total: 0 COWS:  COWS Total Score: 6  Treatment Plan Summary: Daily contact with patient to assess and evaluate symptoms and progress in treatment and Medication management Supportive approach/coping skills/relapse prevention Alcohol Dependence: continue Ativan detox protocol/work a relapse prevention plan Reassess for the use of Campral for the alcohol cravings Hallucinations; will D/C the Abilify and start a trial with Geodon as Geodon might be more effective for the agitation/anxiety Mood instability; will start the Geodon at 40 mg BID with plans to increase to 60 mg BID as tolerated and increase the Neurontin to 400 mg QID Will work with CBT mindfulness  Medical Decision Making:  Review of Psycho-Social Stressors (1), Review or order clinical lab tests (1), Review of Medication Regimen & Side Effects (2) and Review of New Medication or Change in Dosage (2)     Kaija Kovacevic A 12/01/2014, 2:43 PM

## 2014-12-01 NOTE — Progress Notes (Signed)
Patient ID: [name], male   DOB: [birth year], 44 y.o.   MRN: [id number]  D: Patient with dull, flat affect endorsing depression. Pt denies hallucinations currently and does not appear to be responding to internal stimuli. Pt verbally contracts for safety. A: Q 15 minute safety checks, encourage staff/peer interaction and group participation. Administer medications as ordered by provider. R: Pt compliant with medications; no inappropriate behaviors noted. Pt with no s/s of distress noted.

## 2014-12-02 DIAGNOSIS — F251 Schizoaffective disorder, depressive type: Principal | ICD-10-CM

## 2014-12-02 DIAGNOSIS — R45851 Suicidal ideations: Secondary | ICD-10-CM

## 2014-12-02 MED ORDER — ZIPRASIDONE HCL 60 MG PO CAPS
60.0000 mg | ORAL_CAPSULE | Freq: Two times a day (BID) | ORAL | Status: DC
  Administered 2014-12-02 – 2014-12-04 (×4): 60 mg via ORAL
  Filled 2014-12-02 (×6): qty 1

## 2014-12-02 NOTE — BHH Group Notes (Signed)
[facility] Group Notes:  (Clinical Social Work)  12/02/2014   1:15-2:15PM  Summary of Progress/Problems:   The main focus of today's process group was for the patient to identify ways in which they have sabotaged their own mental health wellness/recovery.  Motivational interviewing and a handout were used to explore the benefits and costs of their self-sabotaging behavior as well as the benefits and costs of changing this behavior.  The Stages of Change were explained to the group using a handout, and patients identified where they are with regard to changing self-defeating behaviors.  The patient expressed he self-sabotages with isolation, living dangerously, self-injury and procrastination.  He participated well, was insightful.  Type of Therapy:  Process Group  Participation Level:  Active  Participation Quality:  Attentive  Affect:  Appropriate  Cognitive:  Appropriate and Oriented  Insight:  Engaged  Engagement in Therapy:  Engaged  Modes of Intervention:  Education, Motivational Interviewing   Selmer Dominion, LCSW 12/02/2014, 4:00pm

## 2014-12-02 NOTE — Progress Notes (Signed)
[facility] MD Progress Note  12/02/2014 9:18 AM [name]  MRN:  [id number] Subjective:  I like new medication.  I had some sleep.    Objective: Patient seen chart review.  Patient like Geodon .  It was started since he does not see any improvement with Abilify.  He sleeping better but he still have a lot of depression, anxiety, suicidal thoughts .  He still have a lot of rumination and anxiety.  His tremors are getting better.  He continued to endorse voices and paranoia.  He is taking Ambien at night .  He does not feel trazodone is helping.  He is going to the groups .    Principal Problem: Schizoaffective disorder Diagnosis:   Patient Active Problem List   Diagnosis Date Noted  . Alcohol use disorder, severe, dependence [F10.20] 11/29/2014  . Current smoker [Z72.0] 11/20/2014  . Tremors [G25.2] 11/03/2014  . Chronic posttraumatic stress disorder [F43.12]   . Protein-calorie malnutrition, severe [E43] 10/19/2014  . Hallucinations, visual [R44.1]   . Noncompliance with medication regimen [Z91.14]   . Hallucinations [R44.3] 07/07/2014  . Paranoia [F22] 07/07/2014  . Cannabis abuse [F12.10] 12/30/2013  . Schizoaffective disorder [F25.9] 12/29/2013  . Post traumatic stress disorder (PTSD) [F43.10] 11/03/2011   Total Time spent with patient: 30 minutes   Past Medical History:  Past Medical History  Diagnosis Date  . Psychosis   . PTSD (post-traumatic stress disorder)   . Seizure disorder     related to etoh seizure  . Alcohol abuse   . Schizoaffective disorder   . Anxiety     at age 56  . Depression     at age 88    Past Surgical History  Procedure Laterality Date  . Foot surgery      right  . Hand surgery     Family History:  Family History  Problem Relation Age of Onset  . Alcoholism Mother   . CAD Father   . Depression Brother    Social History:  History  Alcohol Use  . Yes    Comment: "I'm a binge drinker.[name] 4-40 ounces/day"     History  Drug Use  . 1.00  per week  . Special: Marijuana    Comment: THC 2 to 3 times per month    History   Social History  . Marital Status: Single    Spouse Name: N/A  . Number of Children: N/A  . Years of Education: N/A   Social History Main Topics  . Smoking status: Current Every Day Smoker -- 1.50 packs/day for 24 years    Types: Cigarettes  . Smokeless tobacco: Never Used  . Alcohol Use: Yes     Comment: "I'm a binge drinker.[name] 4-40 ounces/day"  . Drug Use: 1.00 per week    Special: Marijuana     Comment: THC 2 to 3 times per month  . Sexual Activity: Yes   Other Topics Concern  . None   Social History Narrative   Additional History:    Sleep: Poor  Appetite:  Poor   Assessment:   Musculoskeletal: Strength & Muscle Tone: within normal limits Gait & Station: normal Patient leans: N/A   Psychiatric Specialty Exam: Physical Exam  Review of Systems  Constitutional: Positive for malaise/fatigue.  Neurological: Positive for tremors.  Psychiatric/Behavioral: Positive for depression, suicidal ideas and hallucinations. The patient is nervous/anxious.     Blood pressure 112/75, pulse 89, temperature 98.5 F (36.9 C), temperature source Oral, resp. rate 18,  height 5\' 10"  (1.778 m), weight 87.998 kg (194 lb).Body mass index is 27.84 kg/(m^2).  General Appearance: Disheveled  Eye Contact::  Minimal  Speech:  Clear and Coherent and slow not spontaneous  Volume:  Decreased  Mood:  Depressed, anxious  Affect:  Depressed  Thought Process:  Coherent and Goal Directed  Orientation:  Full (Time, Place, and Person)  Thought Content:  Hallucinations: Auditory, Paranoid Ideation and Rumination  Suicidal Thoughts:  Yes.  without intent/plan  Homicidal Thoughts:  No  Memory:  Immediate;   Fair Recent;   Fair Remote;   Fair  Judgement:  Fair  Insight:  Present and Shallow  Psychomotor Activity:  Restlessness  Concentration:  Fair  Recall:  [employer] of Knowledge:Fair  Language: Fair   Akathisia:  No  Handed:  Right  AIMS (if indicated):     Assets:  Desire for Improvement Housing Social Support  ADL's:  Intact  Cognition: WNL  Sleep:  Number of Hours: 4     Current Medications: Current Facility-Administered Medications  Medication Dose Route Frequency Provider Last Rate Last Dose  . alum & mag hydroxide-simeth (MAALOX/MYLANTA) 200-200-20 MG/5ML suspension 30 mL  30 mL Oral Q4H PRN Patrecia Pour, NP      . gabapentin (NEURONTIN) capsule 400 mg  400 mg Oral TID Nicholaus Bloom, MD   400 mg at 12/02/14 3545  . gabapentin (NEURONTIN) capsule 400 mg  400 mg Oral QHS Nicholaus Bloom, MD   400 mg at 12/01/14 2124  . hydrOXYzine (ATARAX/VISTARIL) tablet 25 mg  25 mg Oral Q6H PRN Laverle Hobby, PA-C   25 mg at 11/30/14 1816  . ibuprofen (ADVIL,MOTRIN) tablet 600 mg  600 mg Oral Q6H PRN Kerrie Buffalo, NP   600 mg at 11/30/14 1816  . loperamide (IMODIUM) capsule 2-4 mg  2-4 mg Oral PRN Laverle Hobby, PA-C   2 mg at 11/30/14 1958  . LORazepam (ATIVAN) tablet 1 mg  1 mg Oral BID Nicholaus Bloom, MD       Followed by  . [START ON 12/04/2014] LORazepam (ATIVAN) tablet 1 mg  1 mg Oral Daily Nicholaus Bloom, MD      . LORazepam (ATIVAN) tablet 1 mg  1 mg Oral Q6H PRN Nicholaus Bloom, MD   1 mg at 12/02/14 0551  . magnesium hydroxide (MILK OF MAGNESIA) suspension 30 mL  30 mL Oral Daily PRN Patrecia Pour, NP      . multivitamin with minerals tablet 1 tablet  1 tablet Oral Daily Laverle Hobby, PA-C   1 tablet at 12/02/14 6256  . nicotine (NICODERM CQ - dosed in mg/24 hours) patch 21 mg  21 mg Transdermal Daily Nicholaus Bloom, MD   21 mg at 12/02/14 3893  . ondansetron (ZOFRAN-ODT) disintegrating tablet 4 mg  4 mg Oral Q6H PRN Laverle Hobby, PA-C      . thiamine (VITAMIN B-1) tablet 100 mg  100 mg Oral Daily Laverle Hobby, PA-C   100 mg at 12/02/14 7342  . traZODone (DESYREL) tablet 100 mg  100 mg Oral QHS PRN Patrecia Pour, NP   100 mg at 11/29/14 2157  . ziprasidone (GEODON)  capsule 40 mg  40 mg Oral BID WC Nicholaus Bloom, MD   40 mg at 12/02/14 8768  . zolpidem (AMBIEN) tablet 10 mg  10 mg Oral QHS PRN Nicholaus Bloom, MD   10 mg at 12/01/14 2124  Lab Results: No results found for this or any previous visit (from the past 48 hour(s)).  Physical Findings: AIMS: Facial and Oral Movements Muscles of Facial Expression: None, normal Lips and Perioral Area: None, normal Jaw: None, normal Tongue: None, normal,Extremity Movements Upper (arms, wrists, hands, fingers): None, normal Lower (legs, knees, ankles, toes): None, normal, Trunk Movements Neck, shoulders, hips: None, normal, Overall Severity Severity of abnormal movements (highest score from questions above): None, normal Incapacitation due to abnormal movements: None, normal Patient's awareness of abnormal movements (rate only patient's report): No Awareness, Dental Status Current problems with teeth and/or dentures?: No Does patient usually wear dentures?: No  CIWA:  CIWA-Ar Total: 3 COWS:  COWS Total Score: 6  Treatment Plan Summary: Daily contact with patient to assess and evaluate symptoms and progress in treatment and Medication management Supportive approach/coping skills/relapse prevention Alcohol Dependence: continue Ativan detox protocol/work a relapse prevention plan Reassess for the use of Campral for the alcohol cravings Increased Geodon 60 mg twice a day .  Patient does not have any side effects.  His tremors are getting better.  Continue Neurontin 400 mg QID Will work with CBT mindfulness  Medical Decision Making:  Review of Psycho-Social Stressors (1), Review or order clinical lab tests (1), Review of Medication Regimen & Side Effects (2) and Review of New Medication or Change in Dosage (2)     Kennidee Heyne T. 12/02/2014, 9:18 AM

## 2014-12-02 NOTE — Progress Notes (Signed)
Adult Psychoeducational Group Note  Date:  12/02/2014 Time: 10:30am  Group Topic/Focus:  Healthy Communication:   The focus of this group is to discuss communication, barriers to communication, as well as healthy ways to communicate with others. Making Healthy Choices:   The focus of this group is to help patients identify negative/unhealthy choices they were using prior to admission and identify positive/healthier coping strategies to replace them upon discharge. Managing Feelings:   The focus of this group is to identify what feelings patients have difficulty handling and develop a plan to handle them in a healthier way upon discharge. Primary and Secondary Emotions:   The focus of this group is to discuss the difference between primary and secondary emotions.  Participation Level:  Active  Participation Quality:  Attentive  Affect:  Flat  Cognitive:  Appropriate  Insight: Improving  Engagement in Group:  Engaged  Modes of Intervention:  Activity, Discussion, Education and Support  Additional Comments:  Pt able to identify positive and negative personal coping skills. Pt shows insight into his actions and emotions.   [name] 12/02/2014, 12:19 PM

## 2014-12-02 NOTE — Progress Notes (Signed)
D.  Pt pleasant but flat on approach, complaint of continued anxiety.  Positive for evening AA group, where Pt volunteered at the request of the speaker to read a part of the AA book aloud to the group.  Interacting appropriately with peers on the unit.  Continued intermittent passive SI, contracts for safety on the unit.  Denies HI/hallucinations.  A.  Support and encouragement offered  R.  Pt remains safe on the unit, will continue to monitor.

## 2014-12-02 NOTE — Progress Notes (Signed)
Adult Psychoeducational Group Note  Date:  12/02/2014 Time: 09:30am  Group Topic/Focus:  Orientation:   The focus of this group is to educate the patient on the purpose and policies of crisis stabilization and provide a format to answer questions about their admission.  The group details unit policies and expectations of patients while admitted.  Participation Level:  Active  Participation Quality:  Attentive  Affect:  Flat  Cognitive:  Appropriate  Insight: Improving  Engagement in Group:  Engaged  Modes of Intervention:  Discussion, Education, Orientation and Support  Additional Comments:  Pt able to identify one daily goal to accomplish today.   [name] 12/02/2014, 10:24 AM

## 2014-12-02 NOTE — Progress Notes (Signed)
D) Pt continues to have thoughts of SI. Depressed with a score of 8 out of 10, hopelessness of 7 and anxiety at a 9. Affect remains flat. Pt attends the groups and does participate with them and will interact with his peers outside of group. Pt requested and received ativan as a prn today. States "I am very, very anxious. Rates his depression at a 8, his hopelessness at a 7 and his anxiety at a 9. Continues to have thoughts of SI  A) given support and reassurance. Verbal contract made with Pt for his safety. Provided with a 1:1 and given encouragement to participate in the groups and milieu management. R) Pt states he will come to staff if he feels he will act on his thoughts.

## 2014-12-02 NOTE — Progress Notes (Signed)
Patient did attend the evening speaker AA meeting.  

## 2014-12-03 DIAGNOSIS — F251 Schizoaffective disorder, depressive type: Secondary | ICD-10-CM | POA: Insufficient documentation

## 2014-12-03 MED ORDER — SERTRALINE HCL 25 MG PO TABS
25.0000 mg | ORAL_TABLET | Freq: Every day | ORAL | Status: DC
  Administered 2014-12-03 – 2014-12-04 (×2): 25 mg via ORAL
  Filled 2014-12-03 (×4): qty 1

## 2014-12-03 MED ORDER — HYDROXYZINE HCL 25 MG PO TABS
ORAL_TABLET | ORAL | Status: AC
  Filled 2014-12-03: qty 1

## 2014-12-03 MED ORDER — HYDROXYZINE HCL 25 MG PO TABS
25.0000 mg | ORAL_TABLET | Freq: Four times a day (QID) | ORAL | Status: DC | PRN
  Administered 2014-12-03 – 2014-12-05 (×6): 25 mg via ORAL
  Filled 2014-12-03 (×5): qty 1
  Filled 2014-12-03: qty 30

## 2014-12-03 NOTE — Progress Notes (Signed)
[facility] MD Progress Note  12/03/2014 9:17 AM [name]  MRN:  [id number]   Subjective: I am more anxious.  I'm not getting Ativan as I used too and I feel my anxiety is coming back.  I like Geodon it is helping my paranoia and hallucination.    Objective: Patient seen chart review.  Patient is taking his medication as prescribed.  He is feeling more nervous and anxious because he is not getting Ativan more frequently.  His tremors and shakes are getting better.  He admitted that his withdrawal symptoms are getting better but he has a lot of anxiety.  In the past he used to take Zoloft with good response.  He continued to endorse suicidal thoughts but denies any plan.  He admitted paranoia and hallucination.  He sleeping on and off but like Ambien which helps to get some hours of sleep.  He is going to the groups but mostly quite and not participate.   Principal Problem: Schizoaffective disorder Diagnosis:   Patient Active Problem List   Diagnosis Date Noted  . Alcohol use disorder, severe, dependence [F10.20] 11/29/2014  . Current smoker [Z72.0] 11/20/2014  . Tremors [G25.2] 11/03/2014  . Chronic posttraumatic stress disorder [F43.12]   . Protein-calorie malnutrition, severe [E43] 10/19/2014  . Hallucinations, visual [R44.1]   . Noncompliance with medication regimen [Z91.14]   . Hallucinations [R44.3] 07/07/2014  . Paranoia [F22] 07/07/2014  . Cannabis abuse [F12.10] 12/30/2013  . Schizoaffective disorder [F25.9] 12/29/2013  . Post traumatic stress disorder (PTSD) [F43.10] 11/03/2011   Total Time spent with patient: 30 minutes   Past Medical History:  Past Medical History  Diagnosis Date  . Psychosis   . PTSD (post-traumatic stress disorder)   . Seizure disorder     related to etoh seizure  . Alcohol abuse   . Schizoaffective disorder   . Anxiety     at age 35  . Depression     at age 76    Past Surgical History  Procedure Laterality Date  . Foot surgery      right   . Hand surgery     Family History:  Family History  Problem Relation Age of Onset  . Alcoholism Mother   . CAD Father   . Depression Brother    Social History:  History  Alcohol Use  . Yes    Comment: "I'm a binge drinker.[name] 4-40 ounces/day"     History  Drug Use  . 1.00 per week  . Special: Marijuana    Comment: THC 2 to 3 times per month    History   Social History  . Marital Status: Single    Spouse Name: N/A  . Number of Children: N/A  . Years of Education: N/A   Social History Main Topics  . Smoking status: Current Every Day Smoker -- 1.50 packs/day for 24 years    Types: Cigarettes  . Smokeless tobacco: Never Used  . Alcohol Use: Yes     Comment: "I'm a binge drinker.[name] 4-40 ounces/day"  . Drug Use: 1.00 per week    Special: Marijuana     Comment: THC 2 to 3 times per month  . Sexual Activity: Yes   Other Topics Concern  . None   Social History Narrative   Additional History:    Sleep: Poor  Appetite:  Poor   Assessment:   Musculoskeletal: Strength & Muscle Tone: within normal limits Gait & Station: normal Patient leans: N/A   Psychiatric Specialty Exam:  Physical Exam  Review of Systems  Constitutional: Positive for malaise/fatigue.  Neurological: Positive for tremors.  Psychiatric/Behavioral: Positive for depression, suicidal ideas and hallucinations. The patient is nervous/anxious and has insomnia.     Blood pressure 120/86, pulse 91, temperature 98.6 F (37 C), temperature source Oral, resp. rate 16, height 5\' 10"  (1.778 m), weight 87.998 kg (194 lb).Body mass index is 27.84 kg/(m^2).  General Appearance: Disheveled  Eye Contact::  Minimal  Speech:  Clear and Coherent and slow not spontaneous  Volume:  Decreased  Mood:  Depressed, anxious  Affect:  Depressed  Thought Process:  Coherent and Goal Directed  Orientation:  Full (Time, Place, and Person)  Thought Content:  Hallucinations: Auditory, Paranoid Ideation and Rumination   Suicidal Thoughts:  Yes.  without intent/plan  Homicidal Thoughts:  No  Memory:  Immediate;   Fair Recent;   Fair Remote;   Fair  Judgement:  Fair  Insight:  Present and Shallow  Psychomotor Activity:  Restlessness  Concentration:  Fair  Recall:  [employer] of Knowledge:Fair  Language: Fair  Akathisia:  No  Handed:  Right  AIMS (if indicated):     Assets:  Desire for Improvement Housing Social Support  ADL's:  Intact  Cognition: WNL  Sleep:  Number of Hours: 4.75     Current Medications: Current Facility-Administered Medications  Medication Dose Route Frequency Provider Last Rate Last Dose  . alum & mag hydroxide-simeth (MAALOX/MYLANTA) 200-200-20 MG/5ML suspension 30 mL  30 mL Oral Q4H PRN Patrecia Pour, NP      . gabapentin (NEURONTIN) capsule 400 mg  400 mg Oral TID Nicholaus Bloom, MD   400 mg at 12/03/14 0750  . gabapentin (NEURONTIN) capsule 400 mg  400 mg Oral QHS Nicholaus Bloom, MD   400 mg at 12/02/14 2139  . ibuprofen (ADVIL,MOTRIN) tablet 600 mg  600 mg Oral Q6H PRN Kerrie Buffalo, NP   600 mg at 11/30/14 1816  . [START ON 12/04/2014] LORazepam (ATIVAN) tablet 1 mg  1 mg Oral Daily Nicholaus Bloom, MD      . magnesium hydroxide (MILK OF MAGNESIA) suspension 30 mL  30 mL Oral Daily PRN Patrecia Pour, NP      . multivitamin with minerals tablet 1 tablet  1 tablet Oral Daily Laverle Hobby, PA-C   1 tablet at 12/03/14 0750  . nicotine (NICODERM CQ - dosed in mg/24 hours) patch 21 mg  21 mg Transdermal Daily Nicholaus Bloom, MD   21 mg at 12/03/14 0749  . thiamine (VITAMIN B-1) tablet 100 mg  100 mg Oral Daily Laverle Hobby, PA-C   100 mg at 12/03/14 0750  . traZODone (DESYREL) tablet 100 mg  100 mg Oral QHS PRN Patrecia Pour, NP   100 mg at 11/29/14 2157  . ziprasidone (GEODON) capsule 60 mg  60 mg Oral BID WC Kathlee Nations, MD   60 mg at 12/03/14 0750  . zolpidem (AMBIEN) tablet 10 mg  10 mg Oral QHS PRN Nicholaus Bloom, MD   10 mg at 12/02/14 2139    Lab Results: No  results found for this or any previous visit (from the past 48 hour(s)).  Physical Findings: AIMS: Facial and Oral Movements Muscles of Facial Expression: None, normal Lips and Perioral Area: None, normal Jaw: None, normal Tongue: None, normal,Extremity Movements Upper (arms, wrists, hands, fingers): None, normal Lower (legs, knees, ankles, toes): None, normal, Trunk Movements Neck, shoulders, hips: None, normal,  Overall Severity Severity of abnormal movements (highest score from questions above): None, normal Incapacitation due to abnormal movements: None, normal Patient's awareness of abnormal movements (rate only patient's report): No Awareness, Dental Status Current problems with teeth and/or dentures?: No Does patient usually wear dentures?: No  CIWA:  CIWA-Ar Total: 3 COWS:  COWS Total Score: 6  Treatment Plan Summary: Daily contact with patient to assess and evaluate symptoms and progress in treatment and Medication management Supportive approach/coping skills/relapse prevention, CBT/mindfulness. Alcohol Dependence: continue Ativan detox protocol/work a relapse prevention plan Anxiety ; start Zoloft 25 mg daily to help anxiety symptoms.  In the past he remembered good response with Zoloft.   Continue Geodon 60 mg twice a day .  Patient does not have any side effects.  His tremors are getting better.  Continue Neurontin 400 mg QID   Medical Decision Making:  Review of Psycho-Social Stressors (1), Review or order clinical lab tests (1), Review and summation of old records (2), Established Problem, Worsening (2), Review of Medication Regimen & Side Effects (2) and Review of New Medication or Change in Dosage (2)     [name]. 12/03/2014, 9:17 AM

## 2014-12-03 NOTE — Progress Notes (Signed)
Patient did attend the evening speaker AA meeting.  

## 2014-12-03 NOTE — Progress Notes (Signed)
D) Pt attends the program and interacts with his peers appropriately. Pt rates his depression at a 9, hopelessness at a 7 and his anxiety at a 10. Continues to have thoughts of SI. Reports feeling haplessness and helpless and does not have a lot to live for. Affect is flat and mood depressed. A) Pt provided with a 1:1 and given opportunities to express his feelings. Praised for going to the groups. Verbal contract made with Pt. R) Pt given support. Contracts for his safety in the hospital.

## 2014-12-03 NOTE — BHH Group Notes (Signed)
[facility] Group Notes:  (Clinical Social Work)  12/03/2014  1:15-2:15  Summary of Progress/Problems:   The main focus of today's process group was to   1)  discuss the importance of adding supports  2)  define health supports versus unhealthy supports  3)  identify the patient's current unhealthy supports and plan how to handle them  4)  Identify the patient's current healthy supports and plan what to add.  An emphasis was placed on using counselor, doctor, therapy groups, 12-step groups, and problem-specific support groups to expand supports.    The patient expressed full comprehension of the concepts presented, and agreed that there is a need to add more supports.  The patient stated his family is healthy and he himself is unhealthy.  Type of Therapy:  Process Group with Motivational Interviewing  Participation Level:  Active  Participation Quality:  Attentive, Sharing and Supportive  Affect:  Blunted  Cognitive:  Appropriate and Oriented  Insight:  Developing/Improving  Engagement in Therapy:  Engaged  Modes of Intervention:   Education, Support and Processing, Activity  Selmer Dominion, LCSW 12/03/2014

## 2014-12-03 NOTE — Progress Notes (Signed)
Patient ID: [name], male   DOB: 23-Aug-1970, 44 y.o.   MRN: [id number] Adult Psychoeducational Group Note  Date:  12/03/2014 Time:  09:30am  Group Topic/Focus:  Personal Choices and Values:   The focus of this group is to help patients assess and explore the importance of values in their lives, how their values affect their decisions, how they express their values and what opposes their expression.  Participation Level:  Active  Participation Quality:  Appropriate  Affect:  Flat  Cognitive:  Appropriate  Insight: Improving  Engagement in Group:  Engaged  Modes of Intervention:  Activity, Education, Exploration and Support  Additional Comments:  Pt able to express three positive supports systems and identify one value that has helped them on their journey to recovery.  [name] 12/03/2014, 11:17 AM

## 2014-12-03 NOTE — Progress Notes (Signed)
Patient ID: [name], male   DOB: 1970-12-19, 44 y.o.   MRN: [id number] Adult Psychoeducational Group Note  Date:  12/03/2014 Time: 10:30am  Group Topic/Focus:  Identifying Needs:   The focus of this group is to help patients identify their personal needs that have been historically problematic and identify healthy behaviors to address their needs.  Participation Level:  Active  Participation Quality:  Appropriate  Affect:  Appropriate  Cognitive:  Appropriate  Insight: Improving  Engagement in Group:  Improving  Modes of Intervention:  Activity, Discussion, Education and Support  Additional Comments:  Pt able to identify one goal to accomplish to accomplish today.   [name] 12/03/2014, 4:13 PM

## 2014-12-04 MED ORDER — SERTRALINE HCL 50 MG PO TABS
50.0000 mg | ORAL_TABLET | Freq: Every day | ORAL | Status: DC
  Administered 2014-12-05 – 2014-12-06 (×2): 50 mg via ORAL
  Filled 2014-12-04 (×3): qty 1
  Filled 2014-12-04: qty 14

## 2014-12-04 MED ORDER — ZIPRASIDONE HCL 80 MG PO CAPS
80.0000 mg | ORAL_CAPSULE | Freq: Two times a day (BID) | ORAL | Status: DC
  Administered 2014-12-04 – 2014-12-06 (×4): 80 mg via ORAL
  Filled 2014-12-04: qty 28
  Filled 2014-12-04 (×3): qty 1
  Filled 2014-12-04: qty 28
  Filled 2014-12-04 (×3): qty 1

## 2014-12-04 NOTE — Progress Notes (Signed)
D:  +ve AH-not command. pt denies SI/HI/VH. Pt is pleasant and cooperative. Pt goal for today is to work on her communication with her mother. Pt stated he felt better. Pt plans to go to [facility] on D/C.   A: Pt was offered support and encouragement. Pt was given scheduled medications. Pt was encourage to attend groups. Q 15 minute checks were done for safety.   R:Pt attends groups and interacts well with peers and staff. Pt is taking medication. Pt has no complaints at this time .Pt receptive to treatment and safety maintained on unit.

## 2014-12-04 NOTE — BHH Group Notes (Signed)
   [facility] LCSW Aftercare Discharge Planning Group Note  12/04/2014  8:45 AM   Participation Quality: Alert, Appropriate and Oriented  Mood/Affect: Depressed and Flat  Depression Rating: 5-6   Anxiety Rating: 8  Thoughts of Suicide: Pt endorses passive SI but contracts for safety  Will you contract for safety? Yes  Current AVH: Pt denies  Plan for Discharge/Comments: Pt attended discharge planning group and actively participated in group. CSW provided pt with today's workbook. Patient reports feeling "good" today. Patient plans to return home to follow up with outpatient services at [facility].  Transportation Means: Pt reports access to transportation  Supports: Patient identifies his family as supportive  Tilden Fossa, MSW, [place] Worker [employer] [phone]

## 2014-12-04 NOTE — Clinical Social Work Note (Signed)
CSW left voicemail for patient's sister Melinda Pottinger [phone] and brother Lysander Calixte [phone], awaiting return call.  Tilden Fossa, MSW, [place] Worker [facility] [phone]

## 2014-12-04 NOTE — Progress Notes (Signed)
Pt has been up and active today.  He rated his depression 6 hopelessness a 5 and anxiety 8 on his self-inventory. He denied any symptoms of revival.  He does admit to hearing voices but refused to talk to this nurse about them.  Pt did agree to that offer.  Dr. Sabra Heck aware.

## 2014-12-04 NOTE — Progress Notes (Signed)
D: Patient presents with depressed mood. Appropriate and interacts with peers. Endorses depression and suicidal thoughts with no plans. Denies pain, AH/VH at this time. No new complaint.  A: Encouragement and support given to patient. Due medications given as ordered. Every 15 minutes check for safety maintained. Will continue to monitor patient for stability. R: Patient remain appropriate

## 2014-12-04 NOTE — Progress Notes (Signed)
[facility] MD Progress Note  12/04/2014 2:21 PM [name]  MRN:  [id number] Subjective:  [name] states that he is tolerating the Geodon well so far. States that he has seen some benefit. States he would not be opposed to an increase in dose wanting as much help as he can from the medication. States that he is committed to abstinence. Feels that if the medication was to help with his symptoms he would have a better chance of being successful in abstaining long term. States he is tired of his own behavior.  Principal Problem: Schizoaffective disorder Diagnosis:   Patient Active Problem List   Diagnosis Date Noted  . Schizoaffective disorder [F25.9] 12/29/2013    Priority: High  . Schizoaffective disorder, depressive type [F25.1]   . Alcohol use disorder, severe, dependence [F10.20] 11/29/2014  . Current smoker [Z72.0] 11/20/2014  . Tremors [G25.2] 11/03/2014  . Chronic posttraumatic stress disorder [F43.12]   . Protein-calorie malnutrition, severe [E43] 10/19/2014  . Hallucinations, visual [R44.1]   . Noncompliance with medication regimen [Z91.14]   . Hallucinations [R44.3] 07/07/2014  . Paranoia [F22] 07/07/2014  . Cannabis abuse [F12.10] 12/30/2013  . Post traumatic stress disorder (PTSD) [F43.10] 11/03/2011   Total Time spent with patient: 30 minutes   Past Medical History:  Past Medical History  Diagnosis Date  . Psychosis   . PTSD (post-traumatic stress disorder)   . Seizure disorder     related to etoh seizure  . Alcohol abuse   . Schizoaffective disorder   . Anxiety     at age 63  . Depression     at age 51    Past Surgical History  Procedure Laterality Date  . Foot surgery      right  . Hand surgery     Family History:  Family History  Problem Relation Age of Onset  . Alcoholism Mother   . CAD Father   . Depression Brother    Social History:  History  Alcohol Use  . Yes    Comment: "I'm a binge drinker.[name] 4-40 ounces/day"     History  Drug Use  . 1.00  per week  . Special: Marijuana    Comment: THC 2 to 3 times per month    History   Social History  . Marital Status: Single    Spouse Name: N/A  . Number of Children: N/A  . Years of Education: N/A   Social History Main Topics  . Smoking status: Current Every Day Smoker -- 1.50 packs/day for 24 years    Types: Cigarettes  . Smokeless tobacco: Never Used  . Alcohol Use: Yes     Comment: "I'm a binge drinker.[name] 4-40 ounces/day"  . Drug Use: 1.00 per week    Special: Marijuana     Comment: THC 2 to 3 times per month  . Sexual Activity: Yes   Other Topics Concern  . None   Social History Narrative   Additional History:    Sleep: Poor  Appetite:  Fair   Assessment:   Musculoskeletal: Strength & Muscle Tone: within normal limits Gait & Station: normal Patient leans: N/A   Psychiatric Specialty Exam: Physical Exam  Review of Systems  Constitutional: Negative.   HENT: Negative.   Eyes: Negative.   Respiratory: Negative.   Cardiovascular: Negative.   Gastrointestinal: Negative.   Genitourinary: Negative.   Musculoskeletal: Negative.   Skin: Negative.   Neurological: Negative.   Endo/Heme/Allergies: Negative.   Psychiatric/Behavioral: Positive for depression, hallucinations and substance abuse.  The patient is nervous/anxious.     Blood pressure 126/82, pulse 92, temperature 98.4 F (36.9 C), temperature source Oral, resp. rate 18, height 5\' 10"  (1.778 m), weight 87.998 kg (194 lb).Body mass index is 27.84 kg/(m^2).  General Appearance: Fairly Groomed  Engineer, water::  Fair  Speech:  Clear and Coherent and not spontaneous  Volume:  Decreased  Mood:  Anxious and Depressed  Affect:  Restricted  Thought Process:  Coherent and Goal Directed  Orientation:  Full (Time, Place, and Person)  Thought Content:  sympotms events worries concerns  Suicidal Thoughts:  "not today"  Homicidal Thoughts:  No  Memory:  Immediate;   Fair Recent;   Fair Remote;   Fair   Judgement:  Fair  Insight:  Present and Shallow  Psychomotor Activity:  Restlessness  Concentration:  Fair  Recall:  [employer] of Knowledge:Fair  Language: Fair  Akathisia:  No  Handed:  Right  AIMS (if indicated):     Assets:  Desire for Improvement Housing Social Support  ADL's:  Intact  Cognition: WNL  Sleep:  Number of Hours: 4.5     Current Medications: Current Facility-Administered Medications  Medication Dose Route Frequency Provider Last Rate Last Dose  . alum & mag hydroxide-simeth (MAALOX/MYLANTA) 200-200-20 MG/5ML suspension 30 mL  30 mL Oral Q4H PRN Patrecia Pour, NP      . gabapentin (NEURONTIN) capsule 400 mg  400 mg Oral TID Nicholaus Bloom, MD   400 mg at 12/04/14 1153  . gabapentin (NEURONTIN) capsule 400 mg  400 mg Oral QHS Nicholaus Bloom, MD   400 mg at 12/03/14 2141  . hydrOXYzine (ATARAX/VISTARIL) tablet 25 mg  25 mg Oral Q6H PRN Kerrie Buffalo, NP   25 mg at 12/04/14 1328  . ibuprofen (ADVIL,MOTRIN) tablet 600 mg  600 mg Oral Q6H PRN Kerrie Buffalo, NP   600 mg at 12/03/14 2141  . magnesium hydroxide (MILK OF MAGNESIA) suspension 30 mL  30 mL Oral Daily PRN Patrecia Pour, NP      . multivitamin with minerals tablet 1 tablet  1 tablet Oral Daily Laverle Hobby, PA-C   1 tablet at 12/04/14 0747  . nicotine (NICODERM CQ - dosed in mg/24 hours) patch 21 mg  21 mg Transdermal Daily Nicholaus Bloom, MD   21 mg at 12/04/14 0747  . [START ON 12/05/2014] sertraline (ZOLOFT) tablet 50 mg  50 mg Oral Daily Nicholaus Bloom, MD      . thiamine (VITAMIN B-1) tablet 100 mg  100 mg Oral Daily Laverle Hobby, PA-C   100 mg at 12/04/14 0747  . traZODone (DESYREL) tablet 100 mg  100 mg Oral QHS PRN Patrecia Pour, NP   100 mg at 11/29/14 2157  . ziprasidone (GEODON) capsule 80 mg  80 mg Oral BID WC Nicholaus Bloom, MD      . zolpidem [facility]) tablet 10 mg  10 mg Oral QHS PRN Nicholaus Bloom, MD   10 mg at 12/03/14 2141    Lab Results: No results found for this or any previous visit  (from the past 48 hour(s)).  Physical Findings: AIMS: Facial and Oral Movements Muscles of Facial Expression: None, normal Lips and Perioral Area: None, normal Jaw: None, normal Tongue: None, normal,Extremity Movements Upper (arms, wrists, hands, fingers): None, normal Lower (legs, knees, ankles, toes): None, normal, Trunk Movements Neck, shoulders, hips: None, normal, Overall Severity Severity of abnormal movements (highest score from questions above):  None, normal Incapacitation due to abnormal movements: None, normal Patient's awareness of abnormal movements (rate only patient's report): No Awareness, Dental Status Current problems with teeth and/or dentures?: No Does patient usually wear dentures?: No  CIWA:  CIWA-Ar Total: 0 COWS:  COWS Total Score: 0  Treatment Plan Summary: Daily contact with patient to assess and evaluate symptoms and progress in treatment and Medication management Supportive approach/coping skills Alcohol Dependence: Work a relapse prevention plan Psychosis: will continue to work with the [facility] and increase the dose to 80 mg BID Mood instability: will increase the Geodon as above Depression: Zoloft was resumed during the weekend will increase to 50 mg in AM CBT/mindfulness   Medical Decision Making:  Review of Psycho-Social Stressors (1), Review or order clinical lab tests (1), Review of Medication Regimen & Side Effects (2) and Review of New Medication or Change in Dosage (2)     Kinsleigh Ludolph A 12/04/2014, 2:21 PM

## 2014-12-04 NOTE — Plan of Care (Signed)
Problem: Ineffective individual coping Goal: LTG: Patient will report a decrease in negative feelings Outcome: Progressing Pt stated he was feeling much better today, denies SI/HI at this time Goal: STG: Patient will remain free from self harm Outcome: Progressing Pt remains safe on the unit

## 2014-12-04 NOTE — Progress Notes (Signed)
Pt attended spiritual caregroupongriefand loss facilitated by chaplain Jerene Pitch. Groupopened with brief discussion and psycho-social ed aroundgriefand loss in relationships and in relation to self - identifying life patterns, circumstances, changes that cause losses. Establishedgroupnorm of speaking from own life experience.Groupgoal of establishing open and affirming space for members to share loss and experience withgrief, normalizegriefexperience and provide psycho social education andgriefsupport.Groupdrew on narrative and [place] therapeutic modalities.   [name] was present and attentive throughout group with appropriate affect. He left group once and returned to room shortly thereafter. [name] did not volunteer information about his own grief process, but resonated with several members in the group who spoke about the loss of a child. [name] expressed empathy for a group member by stating, "I can't begin to know what this is like for you, but hearing your story makes me cry." The group member was able to experience empathy from [name] as facilitator drew attention to moment of care.   [name], [name]

## 2014-12-04 NOTE — Progress Notes (Signed)
Patient ID: [name], male   DOB: 01-18-1971, 44 y.o.   MRN: [id number] PER STATE REGULATIONS 482.30  THIS CHART WAS REVIEWED FOR MEDICAL NECESSITY WITH RESPECT TO THE PATIENT'S ADMISSION/ DURATION OF STAY.  NEXT REVIEW DATE: 12/07/2014  Chauncy Lean, RN, BSN CASE MANAGER

## 2014-12-04 NOTE — BHH Group Notes (Signed)
[place] LCSW Group Therapy 12/04/2014  1:15 pm  Type of Therapy: Group Therapy Participation Level: Active  Participation Quality: Attentive, Sharing and Supportive  Affect: Depressed and Flat  Cognitive: Alert and Oriented  Insight: Developing/Improving and Engaged  Engagement in Therapy: Developing/Improving and Engaged  Modes of Intervention: Clarification, Confrontation, Discussion, Education, Exploration,  Limit-setting, Orientation, Problem-solving, Rapport Building, Art therapist, Socialization and Support  Summary of Progress/Problems: Pt identified obstacles faced currently and processed barriers involved in overcoming these obstacles. Pt identified steps necessary for overcoming these obstacles and explored motivation (internal and external) for facing these difficulties head on. Pt further identified one area of concern in their lives and chose a goal to focus on for today. Patient identified cleaning his house and figuring out the city bus route as short term goals. Patient was unable to identify any obstacles to his goals. CSW and other group members provided patient with emotional support and encouragement.  Tilden Fossa, MSW, [place] Worker [facility] [phone]

## 2014-12-04 NOTE — Progress Notes (Signed)
Recreation Therapy Notes  Date: 04.18.2016 Time: 9:30am Location: [address] Hall Group Room   Group Topic: Stress Management  Goal Area(s) Addresses:  Patient will actively participate in stress management techniques presented during session.   Behavioral Response: Engaged, Attentive   Intervention: Stress management techniques  Activity :  Deep Breathing and Progressive Muscle Relaxation. LRT provided instruction and demonstration on practice of Progressive Muscle Relaxation. Technique was coupled with deep breathing.   Education:  Stress Management, Discharge Planning.   Education Outcome: Acknowledges education  Clinical Observations/Feedback: Patient actively engaged in both deep breathing and progressive muscle relaxation. Patient expressed no difficulties during session and ability to practice independently post d/c.   [name], LRT/CTRS  Lane Hacker 12/04/2014 3:32 PM

## 2014-12-05 NOTE — Progress Notes (Signed)
[facility] MD Progress Note  12/05/2014 5:12 PM [name]  MRN:  [id number] Subjective:  [name] states that he is starting to feel better. He is getting used to the sedation from the [place]. The voices are getting better. He states his mother is staying with him. His brother and sister come by to check on him. He is planning to be more active, resume his walking go to the wellness academy, go to [place] and possibly include going to church in his "program" he states he used to feel better when he used to go Principal Problem: Schizoaffective disorder Diagnosis:   Patient Active Problem List   Diagnosis Date Noted  . Schizoaffective disorder [F25.9] 12/29/2013    Priority: High  . Schizoaffective disorder, depressive type [F25.1]   . Alcohol use disorder, severe, dependence [F10.20] 11/29/2014  . Current smoker [Z72.0] 11/20/2014  . Tremors [G25.2] 11/03/2014  . Chronic posttraumatic stress disorder [F43.12]   . Protein-calorie malnutrition, severe [E43] 10/19/2014  . Hallucinations, visual [R44.1]   . Noncompliance with medication regimen [Z91.14]   . Hallucinations [R44.3] 07/07/2014  . Paranoia [F22] 07/07/2014  . Cannabis abuse [F12.10] 12/30/2013  . Post traumatic stress disorder (PTSD) [F43.10] 11/03/2011   Total Time spent with patient: 30 minutes   Past Medical History:  Past Medical History  Diagnosis Date  . Psychosis   . PTSD (post-traumatic stress disorder)   . Seizure disorder     related to etoh seizure  . Alcohol abuse   . Schizoaffective disorder   . Anxiety     at age 62  . Depression     at age 24    Past Surgical History  Procedure Laterality Date  . Foot surgery      right  . Hand surgery     Family History:  Family History  Problem Relation Age of Onset  . Alcoholism Mother   . CAD Father   . Depression Brother    Social History:  History  Alcohol Use  . Yes    Comment: "I'm a binge drinker.[name] 4-40 ounces/day"     History  Drug Use  . 1.00  per week  . Special: Marijuana    Comment: THC 2 to 3 times per month    History   Social History  . Marital Status: Single    Spouse Name: N/A  . Number of Children: N/A  . Years of Education: N/A   Social History Main Topics  . Smoking status: Current Every Day Smoker -- 1.50 packs/day for 24 years    Types: Cigarettes  . Smokeless tobacco: Never Used  . Alcohol Use: Yes     Comment: "I'm a binge drinker.[name] 4-40 ounces/day"  . Drug Use: 1.00 per week    Special: Marijuana     Comment: THC 2 to 3 times per month  . Sexual Activity: Yes   Other Topics Concern  . None   Social History Narrative   Additional History:    Sleep: Fair  Appetite:  Fair   Assessment:   Musculoskeletal: Strength & Muscle Tone: within normal limits Gait & Station: normal Patient leans: N/A   Psychiatric Specialty Exam: Physical Exam  Review of Systems  Constitutional: Negative.   HENT: Negative.   Eyes: Negative.   Respiratory: Negative.   Cardiovascular: Negative.   Gastrointestinal: Negative.   Genitourinary: Negative.   Musculoskeletal: Negative.   Skin: Negative.   Neurological: Negative.   Endo/Heme/Allergies: Negative.   Psychiatric/Behavioral: Positive for hallucinations and substance  abuse. The patient is nervous/anxious.     Blood pressure 117/80, pulse 93, temperature 97.6 F (36.4 C), temperature source Oral, resp. rate 18, height 5\' 10"  (1.778 m), weight 87.998 kg (194 lb).Body mass index is 27.84 kg/(m^2).  General Appearance: Fairly Groomed  Engineer, water::  Fair  Speech:  Clear and Coherent  Volume:  Normal  Mood:  Euthymic  Affect:  Appropriate  Thought Process:  Coherent and Goal Directed  Orientation:  Full (Time, Place, and Person)  Thought Content:  symptoms worries concerns  Suicidal Thoughts:  No  Homicidal Thoughts:  No  Memory:  Immediate;   Fair Recent;   Fair Remote;   Fair  Judgement:  Fair  Insight:  Present  Psychomotor Activity:  Normal   Concentration:  Fair  Recall:  [employer] [place]  Language: Fair  Akathisia:  No  Handed:  Right  AIMS (if indicated):     Assets:  Desire for Improvement Housing Social Support  ADL's:  Intact  Cognition: WNL  Sleep:  Number of Hours: 6.25     Current Medications: Current Facility-Administered Medications  Medication Dose Route Frequency Provider Last Rate Last Dose  . alum & mag hydroxide-simeth (MAALOX/MYLANTA) 200-200-20 MG/5ML suspension 30 mL  30 mL Oral Q4H PRN Patrecia Pour, NP      . gabapentin (NEURONTIN) capsule 400 mg  400 mg Oral TID Nicholaus Bloom, MD   400 mg at 12/05/14 1653  . gabapentin (NEURONTIN) capsule 400 mg  400 mg Oral QHS Nicholaus Bloom, MD   400 mg at 12/04/14 2134  . hydrOXYzine (ATARAX/VISTARIL) tablet 25 mg  25 mg Oral Q6H PRN Kerrie Buffalo, NP   25 mg at 12/05/14 1422  . ibuprofen (ADVIL,MOTRIN) tablet 600 mg  600 mg Oral Q6H PRN Kerrie Buffalo, NP   600 mg at 12/03/14 2141  . magnesium hydroxide (MILK OF MAGNESIA) suspension 30 mL  30 mL Oral Daily PRN Patrecia Pour, NP      . multivitamin with minerals tablet 1 tablet  1 tablet Oral Daily Laverle Hobby, PA-C   1 tablet at 12/05/14 2956  . nicotine (NICODERM CQ - dosed in mg/24 hours) patch 21 mg  21 mg Transdermal Daily Nicholaus Bloom, MD   21 mg at 12/05/14 2130  . sertraline (ZOLOFT) tablet 50 mg  50 mg Oral Daily Nicholaus Bloom, MD   50 mg at 12/05/14 8657  . thiamine (VITAMIN B-1) tablet 100 mg  100 mg Oral Daily Laverle Hobby, PA-C   100 mg at 12/05/14 0820  . traZODone (DESYREL) tablet 100 mg  100 mg Oral QHS PRN Patrecia Pour, NP   100 mg at 11/29/14 2157  . ziprasidone (GEODON) capsule 80 mg  80 mg Oral BID WC Nicholaus Bloom, MD   80 mg at 12/05/14 1653  . zolpidem (AMBIEN) tablet 10 mg  10 mg Oral QHS PRN Nicholaus Bloom, MD   10 mg at 12/04/14 2134    Lab Results: No results found for this or any previous visit (from the past 48 hour(s)).  Physical Findings: AIMS: Facial  and Oral Movements Muscles of Facial Expression: None, normal Lips and Perioral Area: None, normal Jaw: None, normal Tongue: None, normal,Extremity Movements Upper (arms, wrists, hands, fingers): None, normal Lower (legs, knees, ankles, toes): None, normal, Trunk Movements Neck, shoulders, hips: None, normal, Overall Severity Severity of abnormal movements (highest score from questions above): None, normal Incapacitation due to  abnormal movements: None, normal Patient's awareness of abnormal movements (rate only patient's report): No Awareness, Dental Status Current problems with teeth and/or dentures?: No Does patient usually wear dentures?: No  CIWA:  CIWA-Ar Total: 0 COWS:  COWS Total Score: 0  Treatment Plan Summary: Daily contact with patient to assess and evaluate symptoms and progress in treatment and Medication management Supportive approach/coping skills Alcohol dependence/continue to work a relapse prevention plan Hallucinations: continue to optimize response to the Geodon. He is currently on 160 mg reporting no overt side effects and improvement in the voices. Depression/PTSD; continue the Zoloft at 50 mg daily Mood instability; continue the Geodon/Neurontin combination Insomnia: continue the Trazodone and the Ambien CBT/mindfulness  Medical Decision Making:  Review of Psycho-Social Stressors (1), Review or order clinical lab tests (1), Review of Medication Regimen & Side Effects (2) and Review of New Medication or Change in Dosage (2)     Anitra Doxtater A 12/05/2014, 5:12 PM

## 2014-12-05 NOTE — Clinical Social Work Note (Signed)
Patient reports that his sister will pick him up tomorrow afternoon to provide transportation home.   Tilden Fossa, MSW, [place] Worker [facility] [phone]

## 2014-12-05 NOTE — Progress Notes (Signed)
Patient in bed at the beginning of the shift. He reported having a good day although feeling very sleepy. Patient denied SI/HI and denied Hallucinations. He also denied withdrawal symptoms. He reported sleeping great last night. Writer encouraged and supported patient. Q 15 minute check continues as ordered to maintain safety.

## 2014-12-05 NOTE — Tx Team (Signed)
Interdisciplinary Treatment Plan Update (Adult) Date: 12/05/2014   Time Reviewed: 9:30 AM  Progress in Treatment: Attending groups: Yes Participating in groups: Yes Taking medication as prescribed: Yes Tolerating medication: Yes Family/Significant other contact made: Yes, CSW has spoken with [name]'s mother [name] understands diagnosis: Yes Discussing [name] identified problems/goals with staff: Yes Medical problems stabilized or resolved: Yes Denies suicidal/homicidal ideation: Yes Issues/concerns per [name] self-inventory: Yes Other:  New problem(s) identified: N/A  Discharge Plan or Barriers:   4/19: [name] plans to return home to follow up with outpatient services at [facility].   Reason for Continuation of Hospitalization:  Depression Anxiety Medication Stabilization   Comments: N/A  Estimated length of stay: 1-2 days  For review of initial/current [name] goals, please see plan of care. [name] is a 44 year old Male admitted for depression, SI, and alcohol abuse. [name] lives in [place] alone. [name] will benefit from crisis stabilization, medication evaluation, group therapy, and psycho education in addition to case management for discharge planning. [name] and CSW reviewed pt's identified goals and treatment plan. Pt verbalized understanding and agreed to treatment plan.    Attendees: [name]:    Family:    Physician: Dr. Parke Poisson; Dr. Sabra Heck 12/05/2014 9:30 AM  Nursing: Mayra Neer, Loletta Specter, Eduard Roux, [place], [place] 12/05/2014 9:30 AM  Clinical Social Worker: Tilden Fossa,  [place] 12/05/2014 9:30 AM  Other: Joette Catching, LCSW 12/05/2014 9:30 AM  Other: Maxie Better, LCSWA 12/05/2014 9:30 AM  Other: Lars Pinks, Case Manager 12/05/2014 9:30 AM  Other: Ave Filter NP 12/05/2014 9:30 AM  Other:    Other:    Other:     Scribe for Treatment Team:  Tilden Fossa, MSW, [employer] [phone]

## 2014-12-05 NOTE — BHH Group Notes (Signed)
[place] LCSW Group Therapy  12/05/2014   1:15 PM   Type of Therapy:  Group Therapy  Participation Level:  Active  Participation Quality:  Attentive, Sharing and Supportive  Affect:  Appropriate  Cognitive:  Alert and Oriented  Insight:  Developing/Improving and Engaged  Engagement in Therapy:  Developing/Improving and Engaged  Modes of Intervention:  Clarification, Confrontation, Discussion, Education, Exploration, Limit-setting, Orientation, Problem-solving, Rapport Building, Art therapist, Socialization and Support  Summary of Progress/Problems: The topic for group therapy was feelings about diagnosis.  Pt actively participated in group discussion on their past and current diagnosis and how they feel towards this.  Pt also identified how society and family members judge them, based on their diagnosis as well as stereotypes and stigmas.  Patient discussed stereotypes of those with mental illness and shared that he has a limited social support system due to difficulty trusting others. Patient reported that he does not feel defined by his diagnosis. Patient reported being motivated for recovery. CSW and other group members provided patient with emotional support and encouragement.  Tilden Fossa, MSW, [place] Worker [facility] [phone]

## 2014-12-05 NOTE — Progress Notes (Signed)
D: Patient's affect appropriate to circumstance and mood is anxious. He reported on the self inventory sheet that both his sleep and appetite are good, energy level is normal and ability to concentrate is poor. Patient rates depression "3", feelings of hopelessness "2" and anxiety "7". He's participating in groups, interactive with peers in the dayroom and visible in the milieu. Patient is compliant with the medication regimen.   A: Support and encouragement provided to patient. Scheduled medications given per MD orders. Maintain Q15 minute checks for safety.  R: Patient receptive. Denies SI/HI and AVH. Patient remains safe on the hall.

## 2014-12-05 NOTE — Clinical Social Work Note (Signed)
Patient provided with information on [place] bus transit.   Tilden Fossa, MSW, [place] Worker [facility] [phone]

## 2014-12-05 NOTE — BHH Group Notes (Signed)
Adult Psychoeducational Group Note  Date:  12/05/2014 Time:  10:21 PM  Group Topic/Focus:  Wrap-Up Group:   The focus of this group is to help patients review their daily goal of treatment and discuss progress on daily workbooks.  Participation Level:  Minimal  Participation Quality:  Attentive  Affect:  Flat  Cognitive:  Alert  Insight: Good  Engagement in Group:  Limited  Modes of Intervention:  Discussion  Additional Comments:  [name] stated his day was pretty good.  He is discharging tomorrow back home.  His two coping skills to help in his recovery are playing the guitar and meditation.  [name] 12/05/2014, 10:21 PM

## 2014-12-05 NOTE — BHH Group Notes (Signed)
Adult Psychoeducational Group Note  Date:  12/05/2014 Time:  9:00 AM  Group Topic/Focus:  Overcoming Stress:   The focus of this group is to define stress and help patients assess their triggers.  Participation Level:  Active  Engagement in Group:  Engaged  Modes of Intervention:  Activity- Relaxation techniques   Kathlen [place] 12/05/2014, 5:25 PM

## 2014-12-06 MED ORDER — SERTRALINE HCL 50 MG PO TABS: 50.0000 mg | ORAL_TABLET | Freq: Every day | ORAL | Status: DC

## 2014-12-06 MED ORDER — GABAPENTIN 400 MG PO CAPS
400.0000 mg | ORAL_CAPSULE | Freq: Four times a day (QID) | ORAL | Status: DC
  Filled 2014-12-06 (×4): qty 56

## 2014-12-06 MED ORDER — GABAPENTIN 400 MG PO CAPS: 400.0000 mg | ORAL_CAPSULE | Freq: Three times a day (TID) | ORAL | Status: DC

## 2014-12-06 MED ORDER — TRAZODONE HCL 100 MG PO TABS: 100.0000 mg | ORAL_TABLET | Freq: Every evening | ORAL | Status: DC | PRN

## 2014-12-06 MED ORDER — HYDROXYZINE HCL 25 MG PO TABS: 25.0000 mg | ORAL_TABLET | Freq: Four times a day (QID) | ORAL | Status: DC | PRN

## 2014-12-06 MED ORDER — ZIPRASIDONE HCL 80 MG PO CAPS: 80.0000 mg | ORAL_CAPSULE | Freq: Two times a day (BID) | ORAL | Status: DC

## 2014-12-06 NOTE — BHH Group Notes (Signed)
[place] LCSW Group Therapy 12/06/2014  1:15 PM Type of Therapy: Group Therapy Participation Level: Active  Participation Quality: Attentive, Sharing and Supportive  Affect: Appropriate  Cognitive: Alert and Oriented  Insight: Developing/Improving and Engaged  Engagement in Therapy: Developing/Improving and Engaged  Modes of Intervention: Clarification, Confrontation, Discussion, Education, Exploration, Limit-setting, Orientation, Problem-solving, Rapport Building, Art therapist, Socialization and Support  Summary of Progress/Problems: The topic for group today was emotional regulation. This group focused on both positive and negative emotion identification and allowed group members to process ways to identify feelings, regulate negative emotions, and find healthy ways to manage internal/external emotions. Group members were asked to reflect on a time when their reaction to an emotion led to a negative outcome and explored how alternative responses using emotion regulation would have benefited them. Group members were also asked to discuss a time when emotion regulation was utilized when a negative emotion was experienced. Patient shared that "anger and fear are two sides of the same coin" and discussed ways in which fear can create anger for him. Patient engaged in discussion of negative vs healthy coping skills. CSW and other group members provided emotional support and encouragement.  Tilden Fossa, MSW, [place] Worker [facility] [phone]

## 2014-12-06 NOTE — BHH Suicide Risk Assessment (Signed)
[facility] Discharge Suicide Risk Assessment   Demographic Factors:  Male and Caucasian  Total Time spent with patient: 30 minutes  Musculoskeletal: Strength & Muscle Tone: within normal limits Gait & Station: normal Patient leans: N/A  Psychiatric Specialty Exam: Physical Exam  Review of Systems  Constitutional: Negative.   HENT: Negative.   Eyes: Negative.   Respiratory: Negative.   Cardiovascular: Negative.   Gastrointestinal: Negative.   Genitourinary: Negative.   Musculoskeletal: Negative.   Skin: Negative.   Neurological: Negative.   Endo/Heme/Allergies: Negative.   Psychiatric/Behavioral: Positive for substance abuse. The patient is nervous/anxious.     Blood pressure 123/79, pulse 88, temperature 99.4 F (37.4 C), temperature source Oral, resp. rate 18, height 5\' 10"  (1.778 m), weight 87.998 kg (194 lb).Body mass index is 27.84 kg/(m^2).  General Appearance: Fairly Groomed  Engineer, water::  Fair  Speech:  Clear and [id number]  Volume:  Normal  Mood:  Euthymic  Affect:  Appropriate  Thought Process:  Coherent and Goal Directed  Orientation:  Full (Time, Place, and Person)  Thought Content:  plans as he moves on, relapse prevention plan  Suicidal Thoughts:  No  Homicidal Thoughts:  No  Memory:  Immediate;   Fair Recent;   Fair Remote;   Fair  Judgement:  Fair  Insight:  Present  Psychomotor Activity:  Normal  Concentration:  Fair  Recall:  [employer] [place]  Language: Fair  Akathisia:  No  Handed:  Right  AIMS (if indicated):     Assets:  Desire for Improvement Housing Social Support  Sleep:  Number of Hours: 5.25  Cognition: WNL  ADL's:  Intact   Have you used any form of tobacco in the last 30 days? (Cigarettes, Smokeless Tobacco, Cigars, and/or Pipes): Yes  Has this patient used any form of tobacco in the last 30 days? (Cigarettes, Smokeless Tobacco, Cigars, and/or Pipes) Yes, A prescription for an FDA-approved tobacco cessation medication  was offered at discharge and the patient refused  Mental Status Per Nursing Assessment::   On Admission:  Suicidal ideation indicated by patient  Current Mental Status by Physician: In full contact with reality. There are no active S/S of withdrawal. Denies hallucinations or SI plans or intent. He states the medications are working well. Reports no side effects. He is willing and motivated to continue to work on his relapse prevention plan, continue to work on the lifestyle changes that will help to better manage his mental illness and substance abuse   Loss Factors: NA  Historical Factors: NA  Risk Reduction Factors:   Sense of responsibility to family, Living with another person, especially a relative and Positive social support  Continued Clinical Symptoms:  Depression:   Comorbid alcohol abuse/dependence Alcohol/Substance Abuse/Dependencies  Cognitive Features That Contribute To Risk:  Closed-mindedness, Polarized thinking and Thought constriction (tunnel vision)    Suicide Risk:  Minimal: No identifiable suicidal ideation.  Patients presenting with no risk factors but with morbid ruminations; may be classified as minimal risk based on the severity of the depressive symptoms  Principal Problem: Schizoaffective disorder Discharge Diagnoses:  Patient Active Problem List   Diagnosis Date Noted  . Schizoaffective disorder [F25.9] 12/29/2013    Priority: High  . Schizoaffective disorder, depressive type [F25.1]   . Alcohol use disorder, severe, dependence [F10.20] 11/29/2014  . Current smoker [Z72.0] 11/20/2014  . Tremors [G25.2] 11/03/2014  . Chronic posttraumatic stress disorder [F43.12]   . Protein-calorie malnutrition, severe [E43] 10/19/2014  . Hallucinations, visual [R44.1]   .  Noncompliance with medication regimen [Z91.14]   . Hallucinations [R44.3] 07/07/2014  . Paranoia [F22] 07/07/2014  . Cannabis abuse [F12.10] 12/30/2013  . Post traumatic stress disorder (PTSD)  [F43.10] 11/03/2011    Follow-up Information    Follow up with [facility] On 12/07/2014.   Specialty:  Behavioral Health   Why:  Appointment with Aldean Jewett on Thursday April 21st at 11 am for group therapy. Please call office if you need to reschedule.   Contact information:   [place] [address] [phone]       Plan Of Care/Follow-up recommendations:  Activity:  as tolerated Diet:  regular Follow up [facility] as above Is patient on multiple antipsychotic therapies at discharge:  No   Has Patient had three or more failed trials of antipsychotic monotherapy by history:  No  Recommended Plan for Multiple Antipsychotic Therapies: NA    Mayan Dolney A 12/06/2014, 12:32 PM

## 2014-12-06 NOTE — Discharge Summary (Addendum)
Physician Discharge Summary Note  Patient:  [name] is an 44 y.o., male MRN:  [id number] DOB:  03-Oct-1970 Patient phone:  [phone] (home)  Patient address:   [address] [place] [address],  Total Time spent with patient: 30 minutes  Date of Admission:  11/29/2014 Date of Discharge: 12/06/2014  Reason for Admission:  Psychosis.  Substance abuse  Principal Problem: Schizoaffective disorder Discharge Diagnoses: Patient Active Problem List   Diagnosis Date Noted  . Schizoaffective disorder, depressive type [F25.1]   . Alcohol use disorder, severe, dependence [F10.20] 11/29/2014  . Current smoker [Z72.0] 11/20/2014  . Tremors [G25.2] 11/03/2014  . Chronic posttraumatic stress disorder [F43.12]   . Protein-calorie malnutrition, severe [E43] 10/19/2014  . Hallucinations, visual [R44.1]   . Noncompliance with medication regimen [Z91.14]   . Hallucinations [R44.3] 07/07/2014  . Paranoia [F22] 07/07/2014  . Cannabis abuse [F12.10] 12/30/2013  . Schizoaffective disorder [F25.9] 12/29/2013  . Post traumatic stress disorder (PTSD) [F43.10] 11/03/2011    Musculoskeletal: Strength & Muscle Tone: within normal limits Gait & Station: normal Patient leans: Right  Psychiatric Specialty Exam:  SEE SRA Physical Exam  Vitals reviewed.   Review of Systems  Psychiatric/Behavioral: The patient is nervous/anxious.   All other systems reviewed and are negative.   Blood pressure 123/79, pulse 88, temperature 99.4 F (37.4 C), temperature source Oral, resp. rate 18, height 5\' 10"  (1.778 m), weight 87.998 kg (194 lb).Body mass index is 27.84 kg/(m^2).   Past Medical History:  Past Medical History  Diagnosis Date  . Psychosis   . PTSD (post-traumatic stress disorder)   . Seizure disorder     related to etoh seizure  . Alcohol abuse   . Schizoaffective disorder   . Anxiety     at age 72  . Depression     at age 89    Past Surgical History  Procedure Laterality  Date  . Foot surgery      right  . Hand surgery     Family History:  Family History  Problem Relation Age of Onset  . Alcoholism Mother   . CAD Father   . Depression Brother    Social History:  History  Alcohol Use  . Yes    Comment: "I'm a binge drinker.[name] 4-40 ounces/day"     History  Drug Use  . 1.00 per week  . Special: Marijuana    Comment: THC 2 to 3 times per month    History   Social History  . Marital Status: Single    Spouse Name: N/A  . Number of Children: N/A  . Years of Education: N/A   Social History Main Topics  . Smoking status: Current Every Day Smoker -- 1.50 packs/day for 24 years    Types: Cigarettes  . Smokeless tobacco: Never Used  . Alcohol Use: Yes     Comment: "I'm a binge drinker.[name] 4-40 ounces/day"  . Drug Use: 1.00 per week    Special: Marijuana     Comment: THC 2 to 3 times per month  . Sexual Activity: Yes   Other Topics Concern  . None   Social History Narrative   Risk to Self: Is patient at risk for suicide?: Yes Risk to Others:   Prior Inpatient Therapy:   Prior Outpatient Therapy:    Level of Care:  OP  Hospital Course:  [name] is an 44 y.o. male with history of PTSD, Alcohol, Schizoaffective Disorder, Anxiety, and Depression. Patient [facility] to [facility] with  alcohol withdrawals. Patient reports that his skin feels like "skin and needles". He is also irritable, with hot flashes, and tremors. He has a history of black outs and seizures. His last seizure was several yrs ago. Patient also reports occasional THC use. Sts that his last use was 1 week ago. Patient suicidal with plan to cut self or overdose. Patient identifies his withdrawals symptoms as a stressors. Patient reporting previous suicide attempts (overdose and cutting). Patient self mutilates himself by cutting. He shows this Probation officer several self inflicted (superficial) cuts. Patient denies HI. He reports auditory hallucinations of, "People laughing at me and telling  me belittling things about myself". He also has visual hallucinations of "Shadow People". Patient hospitalized at [facility] multiple times, [facility], [facility], and facilities outside of the this state. He currently receives outpatient services at [facility].   [name] was admitted for Schizoaffective disorder and crisis management.  She was treated discharged with the medications listed below under Medication List.  Medical problems were identified and treated as needed.  Home medications were restarted as appropriate.  Improvement was monitored by observation and [name] daily report of symptom reduction.  Emotional and mental status was monitored by daily self-inventory reports completed by [name] and clinical staff.         [name] was evaluated by the treatment team for stability and plans for continued recovery upon discharge.  [name] motivation was an integral factor for scheduling further treatment.  Employment, transportation, bed availability, health status, family support, and any pending legal issues were also considered during his hospital stay.  He was offered further treatment options upon discharge including but not limited to Residential, Intensive Outpatient, and Outpatient treatment.  [name] will follow up with the services as listed below under Follow Up Information.     Upon completion of this admission the patient was both mentally and medically stable for discharge denying suicidal/homicidal ideation, auditory/visual/tactile hallucinations, delusional thoughts and paranoia.      Consults:  psychiatry  Significant Diagnostic Studies:  labs: per ED  Discharge Vitals:   Blood pressure 123/79, pulse 88, temperature 99.4 F (37.4 C), temperature source Oral, resp. rate 18, height 5\' 10"  (1.778 m), weight 87.998 kg (194 lb). Body mass index is 27.84 kg/(m^2). Lab Results:   No results found for this or any previous visit (from the past 72  hour(s)).  Physical Findings: AIMS: Facial and Oral Movements Muscles of Facial Expression: None, normal Lips and Perioral Area: None, normal Jaw: None, normal Tongue: None, normal,Extremity Movements Upper (arms, wrists, hands, fingers): None, normal Lower (legs, knees, ankles, toes): None, normal, Trunk Movements Neck, shoulders, hips: None, normal, Overall Severity Severity of abnormal movements (highest score from questions above): None, normal Incapacitation due to abnormal movements: None, normal Patient's awareness of abnormal movements (rate only patient's report): No Awareness, Dental Status Current problems with teeth and/or dentures?: No Does patient usually wear dentures?: No  CIWA:  CIWA-Ar Total: 0 COWS:  COWS Total Score: 0  See Psychiatric Specialty Exam and Suicide Risk Assessment completed by Attending Physician prior to discharge.  Discharge destination:  Home  Is patient on multiple antipsychotic therapies at discharge:  Yes,   Do you recommend tapering to monotherapy for antipsychotics?  No   Has Patient had three or more failed trials of antipsychotic monotherapy by history:  No  Recommended Plan for Multiple Antipsychotic Therapies: NA    Medication List    STOP taking these medications  lithium carbonate 300 MG capsule     naproxen sodium 220 MG tablet  Commonly known as:  ANAPROX     QUEtiapine 400 MG tablet  Commonly known as:  SEROQUEL      TAKE these medications      Indication   gabapentin 400 MG capsule  Commonly known as:  NEURONTIN  Take 1 capsule (400 mg total) by mouth 3 (three) times daily. Take one capsules three times a day and once at bedtime.   Indication:  Aggressive Behavior     hydrOXYzine 25 MG tablet  Commonly known as:  ATARAX/VISTARIL  Take 1 tablet (25 mg total) by mouth every 6 (six) hours as needed for anxiety.   Indication:  Anxiety Neurosis     sertraline 50 MG tablet  Commonly known as:  ZOLOFT  Take 1  tablet (50 mg total) by mouth daily.   Indication:  Anxiety Disorder     traZODone 100 MG tablet  Commonly known as:  DESYREL  Take 1 tablet (100 mg total) by mouth at bedtime as needed for sleep.   Indication:  Trouble Sleeping     ziprasidone 80 MG capsule  Commonly known as:  GEODON  Take 1 capsule (80 mg total) by mouth 2 (two) times daily with a meal.   Indication:  Manic-Depression           Follow-up Information    Follow up with [facility] On 12/07/2014.   Specialty:  Behavioral Health   Why:  Appointment with Aldean Jewett on Thursday April 21st at 11 am for group therapy. Please call office if you need to reschedule.   Contact information:   [place] [address] [phone]       Follow-up recommendations:  Activity:  as tol, diet as tol  Comments:  1.  Take all your medications as prescribed.              2.  Report any adverse side effects to outpatient provider.                       3.  Patient instructed to not use alcohol or illegal drugs while on prescription medicines.            4.  In the event of worsening symptoms, instructed patient to call 911, the crisis hotline or go to nearest emergency room for evaluation of symptoms.  Total Discharge Time:  30 min  Signed: Freda Munro May Agustin AGNP-BC 12/06/2014, 5:13 PM  I personally assessed the patient and formulated the plan Geralyn Flash A. Sabra Heck, M.D. I personally assessed the patient and formulated the plan Geralyn Flash A. Sabra Heck, M.D.

## 2014-12-06 NOTE — Progress Notes (Signed)
  [facility]-Behavioral Health Department Adult Case Management Discharge Plan :  Will you be returning to the same living situation after discharge:  Yes,  patient plans to return to his residence At discharge, do you have transportation home?: Yes,  patient reports that his sister will pick him up Do you have the ability to pay for your medications: Yes,  patient will be provided with prescriptions at discharge  Release of information consent forms completed and in the chart;  Patient's signature needed at discharge.  Patient to Follow up at: Follow-up Information    Follow up with [facility] On 12/07/2014.   Specialty:  Behavioral Health   Why:  Appointment with Aldean Jewett on Thursday April 21st at 11 am for group therapy. Please call office if you need to reschedule.   Contact information:   [place] [address] [phone]       Patient denies SI/HI: Yes,  denies    Safety Planning and Suicide Prevention discussed: Yes,  with patient and mother  Have you used any form of tobacco in the last 30 days? (Cigarettes, Smokeless Tobacco, Cigars, and/or Pipes): Yes  Has patient been referred to the Quitline?: Patient refused referral  Eshaal Duby, [name] 12/06/2014, 10:26 AM

## 2014-12-06 NOTE — Progress Notes (Signed)
Recreation Therapy Notes  Date: 04.20.2016 Time: 9:30am Location: [address] Group room   Group Topic: Stress Management  Goal Area(s) Addresses:  Patient will actively participate in stress management techniques presented during session.   Behavioral Response: Engaged  Intervention: Stress management techniques  Activity :  Deep Breathing and Tapping. LRT provided instruction and demonstration on practice of Tapping. Technique was coupled with deep breathing.   Education:  Stress Management, Discharge Planning.   Education Outcome: Acknowledges education  Clinical Observations/Feedback: Patient actively engaged in techniques presented, expressed no difficulties and that he could practice independently post d/c.   Laureen Ochs Cecilio Ohlrich, LRT/CTRS  Dominiq Fontaine L 12/06/2014 10:17 AM

## 2014-12-06 NOTE — BHH Group Notes (Signed)
   [facility] LCSW Aftercare Discharge Planning Group Note  12/06/2014  8:45 AM   Participation Quality: Alert, Appropriate and Oriented  Mood/Affect: Appropriate  Depression Rating: 2  Anxiety Rating: 8  Thoughts of Suicide: Pt denies SI/HI  Will you contract for safety? Yes  Current AVH: Pt denies  Plan for Discharge/Comments: Pt attended discharge planning group and actively participated in group. CSW provided pt with today's workbook. Patient plans to return home to follow up with [facility] on 4/21 and expressed interest in programs at [facility]. Patient reports that his sister will provide transportation this afternoon. Patient reports feeling ready to discharge home and discussed improvements in his symptoms since admission.  Transportation Means: Pt reports access to transportation by sister  Supports: Patient identifies his family as supportive.  Tilden Fossa, MSW, [place] Worker [facility] [phone]

## 2014-12-06 NOTE — Progress Notes (Signed)
Discharge Note: Discharge instructions/prescriptions/medication samples given to patient. Patient verbalized understanding of discharge instructions and prescriptions. Returned belongings to patient. Denies SI/HI/AVH. Patient d/c without incident to the lobby and transported from the hospital with his sister.

## 2014-12-08 NOTE — Progress Notes (Signed)
Patient Discharge Instructions:  After Visit Summary (AVS):   Faxed to:  12/08/14 Discharge Summary Note:   Faxed to:  12/08/14 Psychiatric Admission Assessment Note:   Faxed to:  12/08/14 Suicide Risk Assessment - Discharge Assessment:   Faxed to:  12/08/14 Faxed/Sent to the Next Level Care provider:  12/08/14 Faxed to [facility] [place], 12/08/2014, 3:54 PM

## 2014-12-24 ENCOUNTER — Emergency Department (HOSPITAL_COMMUNITY): Payer: Medicare Other

## 2014-12-24 ENCOUNTER — Emergency Department (HOSPITAL_COMMUNITY)
Admission: EM | Admit: 2014-12-24 | Discharge: 2014-12-25 | Disposition: A | Discharge status: Other Acute Inpatient Hospital | Payer: Medicare Other | Attending: Emergency Medicine | Admitting: Emergency Medicine

## 2014-12-24 ENCOUNTER — Encounter (HOSPITAL_COMMUNITY): Payer: Self-pay | Admitting: Emergency Medicine

## 2014-12-24 DIAGNOSIS — R062 Wheezing: Secondary | ICD-10-CM | POA: Diagnosis not present

## 2014-12-24 DIAGNOSIS — R0602 Shortness of breath: Secondary | ICD-10-CM | POA: Diagnosis not present

## 2014-12-24 DIAGNOSIS — Z8669 Personal history of other diseases of the nervous system and sense organs: Secondary | ICD-10-CM | POA: Diagnosis not present

## 2014-12-24 DIAGNOSIS — F1210 Cannabis abuse, uncomplicated: Secondary | ICD-10-CM | POA: Diagnosis not present

## 2014-12-24 DIAGNOSIS — R45851 Suicidal ideations: Secondary | ICD-10-CM | POA: Diagnosis not present

## 2014-12-24 DIAGNOSIS — F419 Anxiety disorder, unspecified: Secondary | ICD-10-CM | POA: Insufficient documentation

## 2014-12-24 DIAGNOSIS — Z791 Long term (current) use of non-steroidal anti-inflammatories (NSAID): Secondary | ICD-10-CM | POA: Insufficient documentation

## 2014-12-24 DIAGNOSIS — R000 Tachycardia, unspecified: Secondary | ICD-10-CM | POA: Insufficient documentation

## 2014-12-24 DIAGNOSIS — F10239 Alcohol dependence with withdrawal, unspecified: Secondary | ICD-10-CM | POA: Diagnosis not present

## 2014-12-24 DIAGNOSIS — M791 Myalgia: Secondary | ICD-10-CM | POA: Diagnosis not present

## 2014-12-24 DIAGNOSIS — Z720 Tobacco use: Secondary | ICD-10-CM | POA: Diagnosis not present

## 2014-12-24 DIAGNOSIS — F251 Schizoaffective disorder, depressive type: Secondary | ICD-10-CM | POA: Diagnosis not present

## 2014-12-24 DIAGNOSIS — F329 Major depressive disorder, single episode, unspecified: Secondary | ICD-10-CM | POA: Diagnosis not present

## 2014-12-24 DIAGNOSIS — F10288 Alcohol dependence with other alcohol-induced disorder: Secondary | ICD-10-CM | POA: Diagnosis not present

## 2014-12-24 DIAGNOSIS — F1020 Alcohol dependence, uncomplicated: Secondary | ICD-10-CM | POA: Diagnosis present

## 2014-12-24 DIAGNOSIS — F10129 Alcohol abuse with intoxication, unspecified: Secondary | ICD-10-CM | POA: Diagnosis present

## 2014-12-24 DIAGNOSIS — Z79899 Other long term (current) drug therapy: Secondary | ICD-10-CM | POA: Insufficient documentation

## 2014-12-24 DIAGNOSIS — R05 Cough: Secondary | ICD-10-CM | POA: Diagnosis not present

## 2014-12-24 DIAGNOSIS — F259 Schizoaffective disorder, unspecified: Secondary | ICD-10-CM | POA: Diagnosis not present

## 2014-12-24 LAB — CBC WITH DIFFERENTIAL/PLATELET
Basophils Absolute: 0.1 10*3/uL (ref 0.0–0.1)
Basophils Relative: 1 % (ref 0–1)
Eosinophils Absolute: 0 10*3/uL (ref 0.0–0.7)
Eosinophils Relative: 0 % (ref 0–5)
HCT: 46 % (ref 39.0–52.0)
Hemoglobin: 16 g/dL (ref 13.0–17.0)
Lymphocytes Relative: 44 % (ref 12–46)
Lymphs Abs: 2.2 10*3/uL (ref 0.7–4.0)
MCH: 34.3 pg — ABNORMAL HIGH (ref 26.0–34.0)
MCHC: 34.8 g/dL (ref 30.0–36.0)
MCV: 98.5 fL (ref 78.0–100.0)
Monocytes Absolute: 0.7 10*3/uL (ref 0.1–1.0)
Monocytes Relative: 14 % — ABNORMAL HIGH (ref 3–12)
Neutro Abs: 2.1 10*3/uL (ref 1.7–7.7)
Neutrophils Relative %: 41 % — ABNORMAL LOW (ref 43–77)
Platelets: 308 10*3/uL (ref 150–400)
RBC: 4.67 MIL/uL (ref 4.22–5.81)
RDW: 15.6 % — ABNORMAL HIGH (ref 11.5–15.5)
WBC: 5.1 10*3/uL (ref 4.0–10.5)

## 2014-12-24 LAB — URINE MICROSCOPIC-ADD ON

## 2014-12-24 LAB — COMPREHENSIVE METABOLIC PANEL
ALT: 38 U/L (ref 17–63)
AST: 56 U/L — ABNORMAL HIGH (ref 15–41)
Albumin: 5.2 g/dL — ABNORMAL HIGH (ref 3.5–5.0)
Alkaline Phosphatase: 101 U/L (ref 38–126)
Anion gap: 19 — ABNORMAL HIGH (ref 5–15)
BUN: 9 mg/dL (ref 6–20)
CO2: 26 mmol/L (ref 22–32)
Calcium: 9.5 mg/dL (ref 8.9–10.3)
Chloride: 95 mmol/L — ABNORMAL LOW (ref 101–111)
Creatinine, Ser: 0.76 mg/dL (ref 0.61–1.24)
GFR calc Af Amer: >60 mL/min (ref >60)
GFR calc non Af Amer: >60 mL/min (ref >60)
Glucose, Bld: 82 mg/dL (ref 70–99)
Potassium: 4 mmol/L (ref 3.5–5.1)
Sodium: 140 mmol/L (ref 135–145)
Total Bilirubin: 0.9 mg/dL (ref 0.3–1.2)
Total Protein: 8.6 g/dL — ABNORMAL HIGH (ref 6.5–8.1)

## 2014-12-24 LAB — URINALYSIS, ROUTINE W REFLEX MICROSCOPIC
Bilirubin Urine: NEGATIVE
Glucose, UA: NEGATIVE mg/dL
Hgb urine dipstick: NEGATIVE
Ketones, ur: 40 mg/dL — AB
Leukocytes, UA: NEGATIVE
Nitrite: NEGATIVE
Protein, ur: 100 mg/dL — AB
Specific Gravity, Urine: 1.012 (ref 1.005–1.030)
Urobilinogen, UA: 1 mg/dL (ref 0.0–1.0)
pH: 5.5 (ref 5.0–8.0)

## 2014-12-24 LAB — SALICYLATE LEVEL: Salicylate Lvl: <4 mg/dL (ref 2.8–30.0)

## 2014-12-24 LAB — RAPID URINE DRUG SCREEN, HOSP PERFORMED
Amphetamines: NOT DETECTED
Barbiturates: NOT DETECTED
Benzodiazepines: NOT DETECTED
Cocaine: NOT DETECTED
Opiates: NOT DETECTED
Tetrahydrocannabinol: POSITIVE — AB

## 2014-12-24 LAB — LIPASE, BLOOD: Lipase: 28 U/L (ref 22–51)

## 2014-12-24 LAB — ACETAMINOPHEN LEVEL: Acetaminophen (Tylenol), Serum: <10 ug/mL — ABNORMAL LOW (ref 10–30)

## 2014-12-24 LAB — ETHANOL: Alcohol, Ethyl (B): 281 mg/dL — ABNORMAL HIGH (ref <5)

## 2014-12-24 MED ORDER — VITAMIN B-1 100 MG PO TABS
100.0000 mg | ORAL_TABLET | Freq: Every day | ORAL | Status: DC
  Administered 2014-12-24 (×3): 100 mg via ORAL
  Filled 2014-12-24 (×2): qty 1

## 2014-12-24 MED ORDER — ALBUTEROL SULFATE (2.5 MG/3ML) 0.083% IN NEBU
5.0000 mg | INHALATION_SOLUTION | Freq: Once | RESPIRATORY_TRACT | Status: AC
  Administered 2014-12-24: 5 mg via RESPIRATORY_TRACT
  Filled 2014-12-24: qty 6

## 2014-12-24 MED ORDER — SERTRALINE HCL 50 MG PO TABS
50.0000 mg | ORAL_TABLET | Freq: Every day | ORAL | Status: DC
  Administered 2014-12-24 (×2): 50 mg via ORAL
  Filled 2014-12-24 (×2): qty 1

## 2014-12-24 MED ORDER — SODIUM CHLORIDE 0.9 % IV BOLUS (SEPSIS)
1000.0000 mL | INTRAVENOUS | Status: AC
  Administered 2014-12-24: 1000 mL via INTRAVENOUS

## 2014-12-24 MED ORDER — NICOTINE 21 MG/24HR TD PT24
21.0000 mg | MEDICATED_PATCH | Freq: Every day | TRANSDERMAL | Status: DC
  Administered 2014-12-24 (×2): 21 mg via TRANSDERMAL
  Filled 2014-12-24 (×2): qty 1

## 2014-12-24 MED ORDER — ONDANSETRON HCL 4 MG PO TABS: 4.0000 mg | ORAL_TABLET | Freq: Three times a day (TID) | ORAL | Status: DC | PRN

## 2014-12-24 MED ORDER — GABAPENTIN 400 MG PO CAPS
400.0000 mg | ORAL_CAPSULE | Freq: Three times a day (TID) | ORAL | Status: DC
  Administered 2014-12-24 (×4): 400 mg via ORAL
  Filled 2014-12-24 (×4): qty 1

## 2014-12-24 MED ORDER — IPRATROPIUM BROMIDE 0.02 % IN SOLN
0.5000 mg | Freq: Once | RESPIRATORY_TRACT | Status: AC
  Administered 2014-12-24: 0.5 mg via RESPIRATORY_TRACT
  Filled 2014-12-24: qty 2.5

## 2014-12-24 MED ORDER — LORAZEPAM 1 MG PO TABS: 0.0000 mg | ORAL_TABLET | Freq: Two times a day (BID) | ORAL | Status: DC

## 2014-12-24 MED ORDER — ALUM & MAG HYDROXIDE-SIMETH 200-200-20 MG/5ML PO SUSP: 30.0000 mL | ORAL | Status: DC | PRN

## 2014-12-24 MED ORDER — ZIPRASIDONE HCL 20 MG PO CAPS
80.0000 mg | ORAL_CAPSULE | Freq: Two times a day (BID) | ORAL | Status: DC
  Administered 2014-12-24 (×2): 80 mg via ORAL
  Filled 2014-12-24 (×2): qty 4

## 2014-12-24 MED ORDER — LORAZEPAM 1 MG PO TABS
0.0000 mg | ORAL_TABLET | Freq: Four times a day (QID) | ORAL | Status: DC
  Administered 2014-12-24 (×3): 1 mg via ORAL
  Administered 2014-12-25: 2 mg via ORAL
  Filled 2014-12-24: qty 2
  Filled 2014-12-24 (×3): qty 1

## 2014-12-24 MED ORDER — THIAMINE HCL 100 MG/ML IJ SOLN: 100.0000 mg | Freq: Every day | INTRAMUSCULAR | Status: DC

## 2014-12-24 MED ORDER — IBUPROFEN 200 MG PO TABS: 600.0000 mg | ORAL_TABLET | Freq: Three times a day (TID) | ORAL | Status: DC | PRN

## 2014-12-24 NOTE — BH Assessment (Addendum)
0800:  Consulted with PA-C [place] about the Patient.  Reports Patient has an alcohol use disorder, depression, and is experiencing SI with a plan to cut himself with a knife if he is sent home.  He reports Patient has COPD.  0803:  Schedule tele-assessment for 5320.  0826:  Completed tele-assessment.  2334:  Consulted with [place], NP.  Per Gust Rung, NP; PT meets inpatient criteria transfer to [place]:  Patient disposition provided to [facility].

## 2014-12-24 NOTE — BH Assessment (Signed)
Assessment Note  [name] is an 44 y.o. male, reports his Girl Friend called EMS to bring him to [facility].  Patient presented, orientated x4, mood "depressed" and "I'm in withdrawal" from alcohol, affect flat, experiencing current AH of "laughter", current SI with a plan to cut himself with a knife.  The Patient denied current HI and VH.  The Patient reports he binge drinks 6 to 8 "tall boys" beers daily.  He reports consuming alcohol upon awakening, if not he will experience withdrawal symptoms.  He reports consuming alcohol through out the day until he passes out at night.  He reports smoking Cannabis 1x per month of 1 bowl.  The Patient reports sleeping 2 hours per night and having a poor appetite and losing 10 lbs since 12-06-2014.  The Patient reports feeling "sad, depressed, isolating" and having a mental health diagnosis of Schizoaffective Disorder.  The Patient reports being prescribed Geodon, Zoloft, and Gabapentin and not being medication compliant.  The Patient reports being admitted to [facility] 2x with last admission 11-29-2014 to 12-06-2014.  The Patient reports not following up with discharge planning to go to [facility] for services.  The Patient reports living along and receiving disability.  He reports his Brother and Sister, who lives locally, are supportive.  The Patient reports wanting inpatient treatment for alcohol use and Schizoaffective Disorder.        Axis I: Schizoaffective Disorder and Alcohol Use Disorder, severe Axis II: Deferred Axis IV: other psychosocial or environmental problems and problems related to social environment Axis V: 11-20 some danger of hurting self or others possible OR occasionally fails to maintain minimal personal hygiene OR gross impairment in communication  Past Medical History:  Past Medical History  Diagnosis Date  . Psychosis   . PTSD (post-traumatic stress disorder)   . Seizure disorder     related to etoh seizure  . Alcohol abuse   . Schizoaffective  disorder   . Anxiety     at age 23  . Depression     at age 39    Past Surgical History  Procedure Laterality Date  . Foot surgery      right  . Hand surgery      Family History:  Family History  Problem Relation Age of Onset  . Alcoholism Mother   . CAD Father   . Depression Brother     Social History:  reports that he has been smoking Cigarettes.  He has a 36 pack-year smoking history. He has never used smokeless tobacco. He reports that he drinks alcohol. He reports that he uses illicit drugs (Marijuana) about once per week.  Additional Social History:     CIWA: CIWA-Ar BP: 126/61 mmHg Pulse Rate: 91 Nausea and Vomiting: no nausea and no vomiting Tactile Disturbances: none Tremor: moderate, with patient's arms extended Auditory Disturbances: not present Paroxysmal Sweats: barely perceptible sweating, palms moist Visual Disturbances: not present Anxiety: mildly anxious Headache, Fullness in Head: none present Agitation: normal activity Orientation and Clouding of Sensorium: oriented and can do serial additions CIWA-Ar Total: 6 COWS:    Allergies:  Allergies  Allergen Reactions  . Wellbutrin [Bupropion] Other (See Comments)    Makes me feel like I'm have a seizure    Home Medications:  (Not in a hospital admission)  OB/GYN Status:  No LMP for male patient.  General Assessment Data Location of Assessment: [facility] TTS Assessment: In system Is this a Tele or Face-to-Face Assessment?: Tele Assessment Is this an Initial  Assessment or a Re-assessment for this encounter?: Initial Assessment Marital status: Single Maiden name: N/A Is patient pregnant?: No Pregnancy Status: No Living Arrangements: Alone Can pt return to current living arrangement?: Yes Admission Status: Voluntary Is patient capable of signing voluntary admission?: Yes Referral Source: Self/Family/Friend Insurance type: Medicare  Medical Screening Exam ([place]) Medical Exam  completed: Yes  Crisis Care Plan Living Arrangements: Alone Name of Psychiatrist: None Name of Therapist: None  Education Status Is patient currently in school?: No Current Grade: N/A Highest grade of school patient has completed: N/A Name of school: N/A Contact person: N/A  Risk to self with the past 6 months Suicidal Ideation: Yes-Currently Present Has patient been a risk to self within the past 6 months prior to admission? : Yes Suicidal Intent: Yes-Currently Present Has patient had any suicidal intent within the past 6 months prior to admission? : Yes Is patient at risk for suicide?: Yes Suicidal Plan?: Yes-Currently Present Has patient had any suicidal plan within the past 6 months prior to admission? : Yes Specify Current Suicidal Plan: Cut self with a knife Access to Means: No (Not currently) Specify Access to Suicidal Means: No What has been your use of drugs/alcohol within the last 12 months?: Alcohol Previous Attempts/Gestures: Yes How many times?: 2 Other Self Harm Risks: None Triggers for Past Attempts: Hallucinations, Other (Comment) (Alcohol withdrawal) Intentional Self Injurious Behavior: None Comment - Self Injurious Behavior: None Family Suicide History: Yes Recent stressful life event(s): Other (Comment) (none compliance w/med regimen) Persecutory voices/beliefs?: No Depression: Yes Depression Symptoms: Insomnia, Isolating, Feeling worthless/self pity Substance abuse history and/or treatment for substance abuse?: Yes Suicide prevention information given to non-admitted patients: Not applicable  Risk to Others within the past 6 months Homicidal Ideation: No Does patient have any lifetime risk of violence toward others beyond the six months prior to admission? : No Thoughts of Harm to Others: No Current Homicidal Intent: No Current Homicidal Plan: No Access to Homicidal Means: No Identified Victim: N/A History of harm to others?: No Assessment of  Violence: None Noted Violent Behavior Description: None' Does patient have access to weapons?: No Criminal Charges Pending?: No Does patient have a court date: No Is patient on probation?: No  Psychosis Hallucinations: Auditory Delusions: None noted  Mental Status Report Appearance/Hygiene: In hospital gown Eye Contact: Poor Motor Activity: Restlessness, Tremors Speech: Logical/coherent Level of Consciousness: Alert Mood: Depressed, Anxious Affect: Flat Anxiety Level: Moderate Most recent panic attack: N/A Thought Processes: Coherent, Relevant Judgement: Impaired Orientation: Person, Place, Time, Situation Obsessive Compulsive Thoughts/Behaviors: None  Cognitive Functioning Concentration: Decreased Memory: Recent Intact IQ: Average Insight: Poor Impulse Control: Poor Appetite: Poor Weight Loss: 10 Weight Gain: 0 Sleep: Decreased Total Hours of Sleep: 2 Vegetative Symptoms: None  ADLScreening [facility] Assessment Services) Patient's cognitive ability adequate to safely complete daily activities?: Yes Patient able to express need for assistance with ADLs?: Yes Independently performs ADLs?: Yes (appropriate for developmental age)  Prior Inpatient Therapy Prior Inpatient Therapy: Yes Prior Therapy Dates: 11-29-14 to 12-06-14 [facility]) Prior Therapy Facilty/Provider(s): [facility] Reason for Treatment: Alcohol and Schizoaffctive DO  Prior Outpatient Therapy Prior Outpatient Therapy: Yes Prior Therapy Dates:  (Patient unable to recall) Prior Therapy Facilty/Provider(s): Unsure Reason for Treatment: Alcohol and mental health Does patient have an ACCT team?: No Does patient have Intensive In-House Services?  : No Does patient have [facility] services? : No Does patient have P4CC services?: No  ADL Screening (condition at time of admission) Patient's cognitive ability adequate to  safely complete daily activities?: Yes Is the patient deaf or have difficulty hearing?: No Does the  patient have difficulty seeing, even when wearing glasses/contacts?: No Does the patient have difficulty concentrating, remembering, or making decisions?: No Patient able to express need for assistance with ADLs?: Yes Does the patient have difficulty dressing or bathing?: No Independently performs ADLs?: Yes (appropriate for developmental age) Does the patient have difficulty walking or climbing stairs?: No Weakness of Legs: None Weakness of Arms/Hands: None  Home Assistive Devices/Equipment Home Assistive Devices/Equipment: None    Abuse/Neglect Assessment (Assessment to be complete while patient is alone) Physical Abuse: Denies Verbal Abuse: Denies Sexual Abuse: Denies Exploitation of patient/patient's resources: Denies Self-Neglect: Denies Values / Beliefs Cultural Requests During Hospitalization: None Spiritual Requests During Hospitalization: None   Advance Directives (For Healthcare) Does patient have an advance directive?: No    Additional Information 1:1 In Past 12 Months?: No CIRT Risk: No Elopement Risk: No Does patient have medical clearance?: Yes     Disposition:  Disposition Initial Assessment Completed for this Encounter: Yes Disposition of Patient: Inpatient treatment program Type of inpatient treatment program: Adult  On Site Evaluation by:   Reviewed with Physician:    Dey-Johnson,Naquita Nappier 12/24/2014 8:43 AM

## 2014-12-24 NOTE — ED Notes (Signed)
Patient resting quietly with eyes closed. Respirations equal and unlabored, skin warm and dry. NAD. Q 15 min safety checks remain in place.

## 2014-12-24 NOTE — ED Notes (Signed)
Bed: WA09 Expected date:  Expected time:  Means of arrival:  Comments: EMS 44yo ETOH

## 2014-12-24 NOTE — ED Provider Notes (Signed)
CSN: [id number]     Arrival date & time 12/24/14  9417 History   First MD Initiated Contact with Patient 12/24/14 [phone]     Chief Complaint  Patient presents with  . Alcohol Intoxication   (Consider location/radiation/quality/duration/timing/severity/associated sxs/prior Treatment) HPI  [name] is a 44 year old male presenting with report of alcohol withdrawal and suicidal ideation. He states he has been having suicidal ideation for the last week. He states he's having thoughts of cutting himself with knives. He also states he drinks 6-8, 24 ounce beers daily. He states he drank his normal amount today, however he is feeling tremors and associates that with alcohol withdrawal. He also reports smoking a pack and a half of cigarettes per day and uses marijuana regularly most recently yesterday. He reports a daily cough, but states it is been Getting worse in the last week, with wheezing and shortness of breath. He also states ongoing right upper quadrant abdominal pain, but states he vomited twice today with one episode of diarrhea. He denies any fevers, chills, bloody/bilious emesis, or melena/hematachezia.   Past Medical History  Diagnosis Date  . Psychosis   . PTSD (post-traumatic stress disorder)   . Seizure disorder     related to etoh seizure  . Alcohol abuse   . Schizoaffective disorder   . Anxiety     at age 32  . Depression     at age 18   Past Surgical History  Procedure Laterality Date  . Foot surgery      right  . Hand surgery     Family History  Problem Relation Age of Onset  . Alcoholism Mother   . CAD Father   . Depression Brother    History  Substance Use Topics  . Smoking status: Current Every Day Smoker -- 1.50 packs/day for 24 years    Types: Cigarettes  . Smokeless tobacco: Never Used  . Alcohol Use: Yes     Comment: "I'm a binge drinker.[name] 4-40 ounces/day"    Review of Systems  Constitutional: Positive for chills. Negative for fever.  HENT:  Negative for sore throat.   Eyes: Negative for visual disturbance.  Respiratory: Positive for cough, shortness of breath and wheezing.   Cardiovascular: Negative for chest pain and leg swelling.  Gastrointestinal: Positive for nausea, vomiting, abdominal pain and diarrhea.  Genitourinary: Negative for dysuria.  Musculoskeletal: Positive for myalgias.  Skin: Negative for rash.  Neurological: Positive for tremors. Negative for weakness, numbness and headaches.  Psychiatric/Behavioral: Positive for suicidal ideas.      Allergies  Wellbutrin  Home Medications   Prior to Admission medications   Medication Sig Start Date End Date Taking? Authorizing Provider  gabapentin (NEURONTIN) 400 MG capsule Take 1 capsule (400 mg total) by mouth 3 (three) times daily. Take one capsules three times a day and once at bedtime. Patient taking differently: Take 400 mg by mouth 4 (four) times daily - after meals and at bedtime. Take one capsules three times a day and once at bedtime. 12/06/14  Yes Kerrie Buffalo, NP  hydrOXYzine (ATARAX/VISTARIL) 25 MG tablet Take 1 tablet (25 mg total) by mouth every 6 (six) hours as needed for anxiety. 12/06/14  Yes Kerrie Buffalo, NP  naproxen sodium (ANAPROX) 220 MG tablet Take 220 mg by mouth 2 (two) times daily as needed (pain).   Yes Historical Provider, MD  sertraline (ZOLOFT) 50 MG tablet Take 1 tablet (50 mg total) by mouth daily. 12/06/14  Yes Kerrie Buffalo, NP  ziprasidone (  GEODON) 80 MG capsule Take 1 capsule (80 mg total) by mouth 2 (two) times daily with a meal. 12/06/14  Yes Kerrie Buffalo, NP  traZODone (DESYREL) 100 MG tablet Take 1 tablet (100 mg total) by mouth at bedtime as needed for sleep. Patient not taking: Reported on 12/24/2014 12/06/14   Kerrie Buffalo, NP   BP 153/94 mmHg  Pulse 99  Temp(Src) 97.9 F (36.6 C) (Oral)  Resp 18  SpO2 94% Physical Exam  Constitutional: He is oriented to person, place, and time. He appears well-developed and  well-nourished. No distress.  HENT:  Head: Normocephalic and atraumatic.  Mouth/Throat: Oropharynx is clear and moist.  Eyes: Conjunctivae are normal. Pupils are equal, round, and reactive to light.  Neck: Normal range of motion. Neck supple.  Cardiovascular: Regular rhythm and intact distal pulses.  Tachycardia present.   Pulmonary/Chest: Effort normal. No respiratory distress. He has no decreased breath sounds. He has wheezes in the right middle field, the right lower field, the left middle field and the left lower field. He has no rhonchi. He has no rales. He exhibits no tenderness.  Abdominal: Soft. He exhibits no distension and no mass. There is no hepatosplenomegaly. There is tenderness in the right upper quadrant. There is no rigidity, no rebound, no guarding, no CVA tenderness, no tenderness at McBurney's point and negative Murphy's sign.    Musculoskeletal: He exhibits no tenderness.  Lymphadenopathy:    He has no cervical adenopathy.  Neurological: He is alert and oriented to person, place, and time. No cranial nerve deficit.  Skin: Skin is warm and dry. No rash noted. He is not diaphoretic.  Psychiatric: He has a normal mood and affect.  Nursing note and vitals reviewed.   ED Course  Procedures (including critical care time) Labs Review Labs Reviewed - No data to display  Imaging Review No results found.   EKG Interpretation None      MDM   Final diagnoses:  Alcohol use disorder, severe, dependence  Suicidal ideation   44 yo with requesting detox from alcohol, concern for ETOH withdrawal and SI. Due to lung exam and report of nausea and vomiting and abd pain will obtain labs, x-ray, give NS bolus and Neb tx prior to medical clearance.   6:00 AM: At end of shift, hand off report given to [employer], PA-C.  Plan includes review labs and re-eval pt after IVF and neb tx to medically clear and allow for TTS assessment.   Filed Vitals:   12/24/14 0438  BP: 153/94    Pulse: 99  Temp: 97.9 F (36.6 C)  TempSrc: Oral  Resp: 18  SpO2: 94%   Meds given in ED:  Medications  sodium chloride 0.9 % bolus 1,000 mL (not administered)    New Prescriptions   No medications on file      Britt Bottom, NP 12/24/14 [place], MD 12/27/14 [phone]

## 2014-12-24 NOTE — ED Notes (Signed)
Breathing treatment has completed pt states he feels like he is breathing a little better.

## 2014-12-24 NOTE — ED Notes (Signed)
Brought in by EMS from home with c/o "need help with alcohol detox".  Pt reports that he drinks and abuses alcohol daily and now wants "detox".  Pt reports RUQ abdominal pain on arrival to ED, with nausea and vomiting--- reports that he has vomited once prior to calling EMS.

## 2014-12-24 NOTE — ED Notes (Signed)
Patient resting quietly in bed with eyes closed, Respirations equal and unlabored, skin warm and dry, NAD. No change in assessment or acuity. Q 15 minute safety checks remain in place.   

## 2014-12-24 NOTE — ED Provider Notes (Signed)
6:13 AM Handoff from [name] NP at shift change. Patient with EtOH intoxication and SI. Pending completion of labs and CXR. Patient with decreased air movement and wheezing on arrival.   7:52 AM Patient seen and examined. Wheezing improved. Patient is medically cleared. Holding orders completed. TTS to see. CIWA initiated.   Exam:  Gen NAD; Heart RRR, nml S1,S2, no m/r/g; Lungs scattered mild exp wheezing; Abd soft, mild generalized tenderness, no rebound or guarding; Ext 2+ pedal pulses bilaterally, no edema.  BP 126/61 mmHg  Pulse 91  Temp(Src) 98.2 F (36.8 C) (Oral)  Resp 16  SpO2 92%  9:53 AM Spoke with TTS. Plan: move to [facility] obs.   Carlisle Cater, PA-C 12/24/14 [place], MD 12/27/14 [phone]

## 2014-12-24 NOTE — ED Notes (Signed)
JULIE NT TRANSFERRED THIS PT TO SEPU WITH ONE BELONGING BAG

## 2014-12-24 NOTE — ED Notes (Signed)
TTS VIA MONITOR at bedside.

## 2014-12-24 NOTE — Consult Note (Signed)
[place] [facility] Consult   Reason for Consult: Suicidal thoughts, requesting for alcohol detox Referring Physician: EPS Patient Identification: [name] MRN:  [id number] Principal Diagnosis: Schizoaffective disorder, depressive type Diagnosis:   Patient Active Problem List   Diagnosis Date Noted  . Schizoaffective disorder, depressive type [F25.1]     Priority: High  . Alcohol use disorder, severe, dependence [F10.20] 11/29/2014    Priority: High  . Cannabis abuse [F12.10] 12/30/2013    Priority: High  . Schizoaffective disorder [F25.9] 12/29/2013    Priority: High  . Post traumatic stress disorder (PTSD) [F43.10] 11/03/2011    Priority: High  . Current smoker [Z72.0] 11/20/2014  . Tremors [G25.2] 11/03/2014  . Chronic posttraumatic stress disorder [F43.12]   . Protein-calorie malnutrition, severe [E43] 10/19/2014  . Hallucinations, visual [R44.1]   . Noncompliance with medication regimen [Z91.14]   . Hallucinations [R44.3] 07/07/2014  . Paranoia [F22] 07/07/2014    Total Time spent with patient: 45 minutes  Subjective:   [name] is a 44 y.o. male patient admitted with depression and alcohol withdrawal.  HPI: [name] is an 44 y.o. Male with history of schizoaffective disorder and alcohol use disorder-severe. He reports that he was brought to [facility] long ED by EMS activated by his girlfriend. Patient is here requesting for alcohol detox. He presents with suicidal thoughts, depression, hopelessness, low energy level, anhedonia, flat affect, poor concentration, anxiety, nervousness and auditory hallucinations, hearing voices of people laughing at him. Patient remains suicidal today with a plan to cut himself with a knife or overdose on his medications.Currently, he denies delusional thinking,HI and visual hallucination. The Patient reports he binge drinks 6 to 8 "tall boys" beers daily. He reports consuming alcohol upon awakening, if not he will  experience withdrawal symptoms. He reports consuming alcohol through out the day until he passes out at night. He reports smoking Cannabis 1x per month of 1 bowl.The Patient reports sleeping 2 hours per night and having a poor appetite, he has lost 10 lbs since 12-06-2014.Patient reports being admitted to [facility] 2x in the last 2 months, with last admission 11-29-2014 to 12-06-2014.The Patient reports poor compliance with after care. Patient will benefit from inpatient admission for stabilization, he is unable to contract for safety.  HPI Elements:   Location:  depressed, alcohol use. Quality:  severe. Duration:  ongoing for months. Context:  non-complant with medications and alcohol use disorder.  Past Medical History:  Past Medical History  Diagnosis Date  . Psychosis   . PTSD (post-traumatic stress disorder)   . Seizure disorder     related to etoh seizure  . Alcohol abuse   . Schizoaffective disorder   . Anxiety     at age 62  . Depression     at age 47    Past Surgical History  Procedure Laterality Date  . Foot surgery      right  . Hand surgery     Family History:  Family History  Problem Relation Age of Onset  . Alcoholism Mother   . CAD Father   . Depression Brother    Social History:  History  Alcohol Use  . Yes    Comment: "I'm a binge drinker.[name] 4-40 ounces/day"     History  Drug Use  . 1.00 per week  . Special: Marijuana    Comment: THC 2 to 3 times per month    History   Social History  . Marital Status: Divorced    Spouse Name: N/A  .  Number of Children: N/A  . Years of Education: N/A   Social History Main Topics  . Smoking status: Current Every Day Smoker -- 1.50 packs/day for 24 years    Types: Cigarettes  . Smokeless tobacco: Never Used  . Alcohol Use: Yes     Comment: "I'm a binge drinker.[name] 4-40 ounces/day"  . Drug Use: 1.00 per week    Special: Marijuana     Comment: THC 2 to 3 times per month  . Sexual Activity: Yes   Other Topics  Concern  . None   Social History Narrative   Additional Social History:                          Allergies:   Allergies  Allergen Reactions  . Wellbutrin [Bupropion] Other (See Comments)    Makes me feel like I'm have a seizure    Labs:  Results for orders placed or performed during the hospital encounter of 12/24/14 (from the past 48 hour(s))  CBC WITH DIFFERENTIAL     Status: Abnormal   Collection Time: 12/24/14  5:41 AM  Result Value Ref Range   WBC 5.1 4.0 - 10.5 K/uL   RBC 4.67 4.22 - 5.81 MIL/uL   Hemoglobin 16.0 13.0 - 17.0 g/dL   HCT 46.0 39.0 - 52.0 %   MCV 98.5 78.0 - 100.0 fL   MCH 34.3 (H) 26.0 - 34.0 pg   MCHC 34.8 30.0 - 36.0 g/dL   RDW 15.6 (H) 11.5 - 15.5 %   Platelets 308 150 - 400 K/uL   Neutrophils Relative % 41 (L) 43 - 77 %   Neutro Abs 2.1 1.7 - 7.7 K/uL   Lymphocytes Relative 44 12 - 46 %   Lymphs Abs 2.2 0.7 - 4.0 K/uL   Monocytes Relative 14 (H) 3 - 12 %   Monocytes Absolute 0.7 0.1 - 1.0 K/uL   Eosinophils Relative 0 0 - 5 %   Eosinophils Absolute 0.0 0.0 - 0.7 K/uL   Basophils Relative 1 0 - 1 %   Basophils Absolute 0.1 0.0 - 0.1 K/uL  Comprehensive metabolic panel     Status: Abnormal   Collection Time: 12/24/14  5:41 AM  Result Value Ref Range   Sodium 140 135 - 145 mmol/L   Potassium 4.0 3.5 - 5.1 mmol/L   Chloride 95 (L) 101 - 111 mmol/L   CO2 26 22 - 32 mmol/L   Glucose, Bld 82 70 - 99 mg/dL   BUN 9 6 - 20 mg/dL   Creatinine, Ser 0.76 0.61 - 1.24 mg/dL   Calcium 9.5 8.9 - 10.3 mg/dL   Total Protein 8.6 (H) 6.5 - 8.1 g/dL   Albumin 5.2 (H) 3.5 - 5.0 g/dL   AST 56 (H) 15 - 41 U/L   ALT 38 17 - 63 U/L   Alkaline Phosphatase 101 38 - 126 U/L   Total Bilirubin 0.9 0.3 - 1.2 mg/dL   GFR calc non Af Amer >60 >60 mL/min   GFR calc Af Amer >60 >60 mL/min    Comment: (NOTE) The eGFR has been calculated using the CKD EPI equation. This calculation has not been validated in all clinical situations. eGFR's persistently <60  mL/min signify possible Chronic Kidney Disease.    Anion gap 19 (H) 5 - 15  Lipase, blood     Status: None   Collection Time: 12/24/14  5:41 AM  Result Value Ref Range   Lipase  28 22 - 51 U/L  Ethanol     Status: Abnormal   Collection Time: 12/24/14  5:47 AM  Result Value Ref Range   Alcohol, Ethyl (B) 281 (H) <5 mg/dL    Comment:        LOWEST DETECTABLE LIMIT FOR SERUM ALCOHOL IS 11 mg/dL FOR MEDICAL PURPOSES ONLY   Acetaminophen level     Status: Abnormal   Collection Time: 12/24/14  5:47 AM  Result Value Ref Range   Acetaminophen (Tylenol), Serum <10 (L) 10 - 30 ug/mL    Comment:        THERAPEUTIC CONCENTRATIONS VARY SIGNIFICANTLY. A RANGE OF 10-30 ug/mL MAY BE AN EFFECTIVE CONCENTRATION FOR MANY PATIENTS. HOWEVER, SOME ARE BEST TREATED AT CONCENTRATIONS OUTSIDE THIS RANGE. ACETAMINOPHEN CONCENTRATIONS >150 ug/mL AT 4 HOURS AFTER INGESTION AND >50 ug/mL AT 12 HOURS AFTER INGESTION ARE OFTEN ASSOCIATED WITH TOXIC REACTIONS.   Salicylate level     Status: None   Collection Time: 12/24/14  5:47 AM  Result Value Ref Range   Salicylate Lvl <2.9 2.8 - 30.0 mg/dL  Drug screen panel, emergency     Status: Abnormal   Collection Time: 12/24/14  6:43 AM  Result Value Ref Range   Opiates NONE DETECTED NONE DETECTED   Cocaine NONE DETECTED NONE DETECTED   Benzodiazepines NONE DETECTED NONE DETECTED   Amphetamines NONE DETECTED NONE DETECTED   Tetrahydrocannabinol POSITIVE (A) NONE DETECTED   Barbiturates NONE DETECTED NONE DETECTED    Comment:        DRUG SCREEN FOR MEDICAL PURPOSES ONLY.  IF CONFIRMATION IS NEEDED FOR ANY PURPOSE, NOTIFY LAB WITHIN 5 DAYS.        LOWEST DETECTABLE LIMITS FOR URINE DRUG SCREEN Drug Class       Cutoff (ng/mL) Amphetamine      1000 Barbiturate      200 Benzodiazepine   518 Tricyclics       841 Opiates          300 Cocaine          300 THC              50   Urinalysis, Routine w reflex microscopic     Status: Abnormal    Collection Time: 12/24/14  6:43 AM  Result Value Ref Range   Color, Urine YELLOW YELLOW   APPearance CLEAR CLEAR   Specific Gravity, Urine 1.012 1.005 - 1.030   pH 5.5 5.0 - 8.0   Glucose, UA NEGATIVE NEGATIVE mg/dL   Hgb urine dipstick NEGATIVE NEGATIVE   Bilirubin Urine NEGATIVE NEGATIVE   Ketones, ur 40 (A) NEGATIVE mg/dL   Protein, ur 100 (A) NEGATIVE mg/dL   Urobilinogen, UA 1.0 0.0 - 1.0 mg/dL   Nitrite NEGATIVE NEGATIVE   Leukocytes, UA NEGATIVE NEGATIVE  Urine microscopic-add on     Status: None   Collection Time: 12/24/14  6:43 AM  Result Value Ref Range   Squamous Epithelial / LPF RARE RARE   WBC, UA 0-2 <3 WBC/hpf   Urine-Other MUCOUS PRESENT     Vitals: Blood pressure 120/84, pulse 102, temperature 97.7 F (36.5 C), temperature source Oral, resp. rate 18, SpO2 100 %.  Risk to Self: Suicidal Ideation: Yes-Currently Present Suicidal Intent: Yes-Currently Present Is patient at risk for suicide?: Yes Suicidal Plan?: Yes-Currently Present Specify Current Suicidal Plan: Cut self with a knife Access to Means: No (Not currently) Specify Access to Suicidal Means: No What has been your use of drugs/alcohol within the last  12 months?: Alcohol How many times?: 2 Other Self Harm Risks: None Triggers for Past Attempts: Hallucinations, Other (Comment) (Alcohol withdrawal) Intentional Self Injurious Behavior: None Comment - Self Injurious Behavior: None Risk to Others: Homicidal Ideation: No Thoughts of Harm to Others: No Current Homicidal Intent: No Current Homicidal Plan: No Access to Homicidal Means: No Identified Victim: N/A History of harm to others?: No Assessment of Violence: None Noted Violent Behavior Description: None' Does patient have access to weapons?: No Criminal Charges Pending?: No Does patient have a court date: No Prior Inpatient Therapy: Prior Inpatient Therapy: Yes Prior Therapy Dates: 11-29-14 to 12-06-14 [facility]) Prior Therapy Facilty/Provider(s):  [facility] Reason for Treatment: Alcohol and Schizoaffctive DO Prior Outpatient Therapy: Prior Outpatient Therapy: Yes Prior Therapy Dates:  (Patient unable to recall) Prior Therapy Facilty/Provider(s): Unsure Reason for Treatment: Alcohol and mental health Does patient have an ACCT team?: No Does patient have Intensive In-House Services?  : No Does patient have [facility] services? : No Does patient have P4CC services?: No  Current Facility-Administered Medications  Medication Dose Route Frequency Provider Last Rate Last Dose  . alum & mag hydroxide-simeth (MAALOX/MYLANTA) 200-200-20 MG/5ML suspension 30 mL  30 mL Oral PRN Carlisle Cater, PA-C      . gabapentin (NEURONTIN) capsule 400 mg  400 mg Oral TID PC & HS Carlisle Cater, PA-C   400 mg at 12/24/14 0920  . ibuprofen (ADVIL,MOTRIN) tablet 600 mg  600 mg Oral Q8H PRN Carlisle Cater, PA-C      . LORazepam (ATIVAN) tablet 0-4 mg  0-4 mg Oral 4 times per day Carlisle Cater, PA-C   1 mg at 12/24/14 0919   Followed by  . [START ON 12/26/2014] LORazepam (ATIVAN) tablet 0-4 mg  0-4 mg Oral Q12H Carlisle Cater, PA-C      . nicotine (NICODERM CQ - dosed in mg/24 hours) patch 21 mg  21 mg Transdermal Daily Carlisle Cater, PA-C   21 mg at 12/24/14 0919  . ondansetron (ZOFRAN) tablet 4 mg  4 mg Oral Q8H PRN Carlisle Cater, PA-C      . sertraline (ZOLOFT) tablet 50 mg  50 mg Oral Daily Carlisle Cater, PA-C   50 mg at 12/24/14 0920  . thiamine (VITAMIN B-1) tablet 100 mg  100 mg Oral Daily Carlisle Cater, PA-C   100 mg at 12/24/14 0920   Or  . thiamine (B-1) injection 100 mg  100 mg Intravenous Daily Carlisle Cater, PA-C      . ziprasidone (GEODON) capsule 80 mg  80 mg Oral BID WC Carlisle Cater, PA-C   80 mg at 12/24/14 0919   Current Outpatient Prescriptions  Medication Sig Dispense Refill  . gabapentin (NEURONTIN) 400 MG capsule Take 1 capsule (400 mg total) by mouth 3 (three) times daily. Take one capsules three times a day and once at bedtime. (Patient taking  differently: Take 400 mg by mouth 4 (four) times daily - after meals and at bedtime. Take one capsules three times a day and once at bedtime.) 120 capsule 0  . hydrOXYzine (ATARAX/VISTARIL) 25 MG tablet Take 1 tablet (25 mg total) by mouth every 6 (six) hours as needed for anxiety. 30 tablet 0  . naproxen sodium (ANAPROX) 220 MG tablet Take 220 mg by mouth 2 (two) times daily as needed (pain).    [name] sertraline (ZOLOFT) 50 MG tablet Take 1 tablet (50 mg total) by mouth daily. 30 tablet 0  . ziprasidone (GEODON) 80 MG capsule Take 1 capsule (80 mg total) by  mouth 2 (two) times daily with a meal. 60 capsule 0    Musculoskeletal: Strength & Muscle Tone: within normal limits Gait & Station: normal Patient leans: N/A  Psychiatric Specialty Exam: Physical Exam  Psychiatric: His mood appears anxious. His affect is blunt. His speech is delayed. He is slowed and withdrawn. Cognition and memory are normal. He expresses impulsivity. He exhibits a depressed mood. He expresses suicidal ideation.    Review of Systems  Constitutional: Positive for malaise/fatigue.  HENT: Negative.   Eyes: Negative.   Respiratory: Negative.   Cardiovascular: Negative.   Gastrointestinal: Positive for nausea.  Genitourinary: Negative.   Musculoskeletal: Positive for myalgias.  Skin: Negative.   Neurological: Positive for weakness.  Endo/Heme/Allergies: Negative.   Psychiatric/Behavioral: Positive for depression, suicidal ideas and substance abuse. The patient is nervous/anxious and has insomnia.     Blood pressure 120/84, pulse 102, temperature 97.7 F (36.5 C), temperature source Oral, resp. rate 18, SpO2 100 %.There is no weight on file to calculate BMI.  General Appearance: Disheveled  Eye Contact::  Minimal  Speech:  Slow  Volume:  Decreased  Mood:  Anxious and Depressed  Affect:  Constricted  Thought Process:  Disorganized  Orientation:  Full (Time, Place, and Person)  Thought Content:  Hallucinations:  Auditory  Suicidal Thoughts:  Yes.  with intent/plan  Homicidal Thoughts:  No  Memory:  Immediate;   Fair Recent;   Good Remote;   Good  Judgement:  Impaired  Insight:  Lacking  Psychomotor Activity:  Decreased  Concentration:  Poor  Recall:  [employer] of Knowledge:Fair  Language: Fair  Akathisia:  No  Handed:  Right  AIMS (if indicated):     Assets:  Communication Skills Desire for Improvement Social Support  ADL's:  Intact  Cognition: WNL  Sleep:   poor   Medical Decision Making: Review or order clinical lab tests (1), Established Problem, Worsening (2), Review of Medication Regimen & Side Effects (2) and Review of New Medication or Change in Dosage (2)  Treatment Plan Summary: Daily contact with patient to assess and evaluate symptoms and progress in treatment and Medication management  Plan:  Recommend psychiatric Inpatient admission when medically cleared. Disposition: as above  Corena Pilgrim, MD 12/24/2014 12:19 PM

## 2014-12-25 ENCOUNTER — Encounter (HOSPITAL_COMMUNITY): Payer: Self-pay | Admitting: *Deleted

## 2014-12-25 ENCOUNTER — Inpatient Hospital Stay (HOSPITAL_COMMUNITY)
Admission: AD | Admit: 2014-12-25 | Discharge: 2014-12-30 | DRG: 897 | Disposition: A | Discharge status: Home / Self Care | Payer: Medicare Other | Source: Intra-hospital | Attending: Psychiatry | Admitting: Psychiatry

## 2014-12-25 DIAGNOSIS — F10288 Alcohol dependence with other alcohol-induced disorder: Secondary | ICD-10-CM | POA: Diagnosis not present

## 2014-12-25 DIAGNOSIS — Z79899 Other long term (current) drug therapy: Secondary | ICD-10-CM

## 2014-12-25 DIAGNOSIS — F17210 Nicotine dependence, cigarettes, uncomplicated: Secondary | ICD-10-CM | POA: Diagnosis present

## 2014-12-25 DIAGNOSIS — F10230 Alcohol dependence with withdrawal, uncomplicated: Secondary | ICD-10-CM | POA: Diagnosis not present

## 2014-12-25 DIAGNOSIS — F10239 Alcohol dependence with withdrawal, unspecified: Principal | ICD-10-CM | POA: Diagnosis present

## 2014-12-25 DIAGNOSIS — Z811 Family history of alcohol abuse and dependence: Secondary | ICD-10-CM

## 2014-12-25 DIAGNOSIS — R45851 Suicidal ideations: Secondary | ICD-10-CM | POA: Diagnosis present

## 2014-12-25 DIAGNOSIS — F10939 Alcohol use, unspecified with withdrawal, unspecified: Secondary | ICD-10-CM | POA: Diagnosis present

## 2014-12-25 DIAGNOSIS — Y908 Blood alcohol level of 240 mg/100 ml or more: Secondary | ICD-10-CM | POA: Diagnosis present

## 2014-12-25 DIAGNOSIS — F251 Schizoaffective disorder, depressive type: Secondary | ICD-10-CM | POA: Diagnosis not present

## 2014-12-25 DIAGNOSIS — F10232 Alcohol dependence with withdrawal with perceptual disturbance: Secondary | ICD-10-CM | POA: Diagnosis not present

## 2014-12-25 MED ORDER — IBUPROFEN 600 MG PO TABS
600.0000 mg | ORAL_TABLET | Freq: Three times a day (TID) | ORAL | Status: DC | PRN
  Administered 2014-12-25 – 2014-12-29 (×4): 600 mg via ORAL
  Filled 2014-12-25 (×4): qty 1

## 2014-12-25 MED ORDER — HYDROXYZINE HCL 25 MG PO TABS
25.0000 mg | ORAL_TABLET | Freq: Four times a day (QID) | ORAL | Status: AC | PRN
  Administered 2014-12-25: 25 mg via ORAL
  Filled 2014-12-25: qty 1

## 2014-12-25 MED ORDER — VITAMIN B-1 100 MG PO TABS: 100.0000 mg | ORAL_TABLET | Freq: Every day | ORAL | Status: DC

## 2014-12-25 MED ORDER — ONDANSETRON HCL 4 MG PO TABS
4.0000 mg | ORAL_TABLET | Freq: Three times a day (TID) | ORAL | Status: DC | PRN
Start: 2014-12-25 — End: 2014-12-25

## 2014-12-25 MED ORDER — LORAZEPAM 1 MG PO TABS
1.0000 mg | ORAL_TABLET | Freq: Four times a day (QID) | ORAL | Status: AC | PRN
Start: 2014-12-25 — End: 2014-12-28
  Administered 2014-12-25: 1 mg via ORAL
  Filled 2014-12-25: qty 1

## 2014-12-25 MED ORDER — ONDANSETRON 4 MG PO TBDP
4.0000 mg | ORAL_TABLET | Freq: Four times a day (QID) | ORAL | Status: AC | PRN
  Administered 2014-12-25: 4 mg via ORAL
  Filled 2014-12-25: qty 1

## 2014-12-25 MED ORDER — VITAMIN B-1 100 MG PO TABS
100.0000 mg | ORAL_TABLET | Freq: Every day | ORAL | Status: DC
  Administered 2014-12-25 – 2014-12-30 (×6): 100 mg via ORAL
  Filled 2014-12-25 (×11): qty 1

## 2014-12-25 MED ORDER — LOPERAMIDE HCL 2 MG PO CAPS
2.0000 mg | ORAL_CAPSULE | ORAL | Status: AC | PRN
  Administered 2014-12-26: 4 mg via ORAL
  Filled 2014-12-25: qty 2

## 2014-12-25 MED ORDER — ALUM & MAG HYDROXIDE-SIMETH 200-200-20 MG/5ML PO SUSP: 30.0000 mL | ORAL | Status: DC | PRN

## 2014-12-25 MED ORDER — THIAMINE HCL 100 MG/ML IJ SOLN: 100.0000 mg | Freq: Every day | INTRAMUSCULAR | Status: DC

## 2014-12-25 MED ORDER — ADULT MULTIVITAMIN W/MINERALS CH
1.0000 | ORAL_TABLET | Freq: Every day | ORAL | Status: DC
  Administered 2014-12-25 – 2014-12-30 (×6): 1 via ORAL
  Filled 2014-12-25 (×10): qty 1

## 2014-12-25 MED ORDER — NICOTINE 21 MG/24HR TD PT24
21.0000 mg | MEDICATED_PATCH | Freq: Every day | TRANSDERMAL | Status: DC
  Administered 2014-12-25 – 2014-12-30 (×6): 21 mg via TRANSDERMAL
  Filled 2014-12-25 (×10): qty 1

## 2014-12-25 MED ORDER — LORAZEPAM 1 MG PO TABS
1.0000 mg | ORAL_TABLET | Freq: Once | ORAL | Status: AC
  Administered 2014-12-25: 1 mg via ORAL
  Filled 2014-12-25: qty 1

## 2014-12-25 MED ORDER — MAGNESIUM HYDROXIDE 400 MG/5ML PO SUSP: 30.0000 mL | Freq: Every day | ORAL | Status: DC | PRN

## 2014-12-25 MED ORDER — THIAMINE HCL 100 MG/ML IJ SOLN: 100.0000 mg | Freq: Once | INTRAMUSCULAR | Status: DC

## 2014-12-25 MED ORDER — ACETAMINOPHEN 325 MG PO TABS: 650.0000 mg | ORAL_TABLET | Freq: Four times a day (QID) | ORAL | Status: DC | PRN

## 2014-12-25 NOTE — H&P (Signed)
[facility] OBS UNIT H&P   Patient Identification: [name] MRN:  [id number] Principal Diagnosis: Alcohol withdrawal Diagnosis:   Patient Active Problem List   Diagnosis Date Noted  . Alcohol withdrawal [F10.239] 12/25/2014    Priority: High  . Schizoaffective disorder, depressive type [F25.1]     Priority: High  . Alcohol use disorder, severe, dependence [F10.20] 11/29/2014  . Current smoker [Z72.0] 11/20/2014  . Tremors [G25.2] 11/03/2014  . Chronic posttraumatic stress disorder [F43.12]   . Protein-calorie malnutrition, severe [E43] 10/19/2014  . Hallucinations, visual [R44.1]   . Noncompliance with medication regimen [Z91.14]   . Hallucinations [R44.3] 07/07/2014  . Paranoia [F22] 07/07/2014  . Cannabis abuse [F12.10] 12/30/2013  . Schizoaffective disorder [F25.9] 12/29/2013  . Post traumatic stress disorder (PTSD) [F43.10] 11/03/2011    Total Time spent with patient: 45 minutes  Subjective:   [name] is a 44 y.o. male patient admitted with depression and alcohol withdrawal. Pt seen and chart reviewed. While pt is known to have a history of malingering behavior, there are 4 nursing notes that support subjective statements made by pt to this NP as well that he wants to "cut his wrists" and commit suicide. Additionally, he is withdrawing from alcohol although his CIWA is stable at this time under 8 consistently. Pt denies homicidal ideation yet continues to affirm suicidal ideation and   HPI: [name] is an 44 y.o. Male with history of schizoaffective disorder and alcohol use disorder-severe. He reports that he was brought to [facility] long ED by EMS activated by his girlfriend. Patient is here requesting for alcohol detox. He presents with suicidal thoughts, depression, hopelessness, low energy level, anhedonia, flat affect, poor concentration, anxiety, nervousness and auditory hallucinations, hearing voices of people laughing at him. Patient remains suicidal today with a  plan to cut himself with a knife or overdose on his medications.Currently, he denies delusional thinking,HI and visual hallucination. The Patient reports he binge drinks 6 to 8 "tall boys" beers daily. He reports consuming alcohol upon awakening, if not he will experience withdrawal symptoms. He reports consuming alcohol through out the day until he passes out at night. He reports smoking Cannabis 1x per month of 1 bowl.The Patient reports sleeping 2 hours per night and having a poor appetite, he has lost 10 lbs since 12-06-2014.Patient reports being admitted to [facility] 2x in the last 2 months, with last admission 11-29-2014 to 12-06-2014.The Patient reports poor compliance with after care. Patient will benefit from inpatient admission for stabilization, he is unable to contract for safety.  *Pt was sent to the [facility] OBS UNIT for safety and stabilization to determine disposition  HPI Elements:   Location:  depressed, alcohol use. Quality:  severe. Duration:  ongoing for months. Context:  non-complant with medications and alcohol use disorder.  Past Medical History:  Past Medical History  Diagnosis Date  . Psychosis   . PTSD (post-traumatic stress disorder)   . Seizure disorder     related to etoh seizure  . Alcohol abuse   . Schizoaffective disorder   . Anxiety     at age 89  . Depression     at age 44    Past Surgical History  Procedure Laterality Date  . Foot surgery      right  . Hand surgery     Family History:  Family History  Problem Relation Age of Onset  . Alcoholism Mother   . CAD Father   . Depression Brother    Social History:  History  Alcohol Use  . Yes    Comment: "I'm a binge drinker.[name] 4-40 ounces/day"     History  Drug Use  . 1.00 per week  . Special: Marijuana    Comment: THC 2 to 3 times per month    History   Social History  . Marital Status: Divorced    Spouse Name: N/A  . Number of Children: N/A  . Years of Education: N/A   Social History  Main Topics  . Smoking status: Current Every Day Smoker -- 1.50 packs/day for 24 years    Types: Cigarettes  . Smokeless tobacco: Never Used  . Alcohol Use: Yes     Comment: "I'm a binge drinker.[name] 4-40 ounces/day"  . Drug Use: 1.00 per week    Special: Marijuana     Comment: THC 2 to 3 times per month  . Sexual Activity: Yes   Other Topics Concern  . None   Social History Narrative   Additional Social History:                          Allergies:   Allergies  Allergen Reactions  . Wellbutrin [Bupropion] Other (See Comments)    Makes me feel like I'm have a seizure    Labs:  Results for orders placed or performed during the hospital encounter of 12/24/14 (from the past 48 hour(s))  CBC WITH DIFFERENTIAL     Status: Abnormal   Collection Time: 12/24/14  5:41 AM  Result Value Ref Range   WBC 5.1 4.0 - 10.5 K/uL   RBC 4.67 4.22 - 5.81 MIL/uL   Hemoglobin 16.0 13.0 - 17.0 g/dL   HCT 46.0 39.0 - 52.0 %   MCV 98.5 78.0 - 100.0 fL   MCH 34.3 (H) 26.0 - 34.0 pg   MCHC 34.8 30.0 - 36.0 g/dL   RDW 15.6 (H) 11.5 - 15.5 %   Platelets 308 150 - 400 K/uL   Neutrophils Relative % 41 (L) 43 - 77 %   Neutro Abs 2.1 1.7 - 7.7 K/uL   Lymphocytes Relative 44 12 - 46 %   Lymphs Abs 2.2 0.7 - 4.0 K/uL   Monocytes Relative 14 (H) 3 - 12 %   Monocytes Absolute 0.7 0.1 - 1.0 K/uL   Eosinophils Relative 0 0 - 5 %   Eosinophils Absolute 0.0 0.0 - 0.7 K/uL   Basophils Relative 1 0 - 1 %   Basophils Absolute 0.1 0.0 - 0.1 K/uL  Comprehensive metabolic panel     Status: Abnormal   Collection Time: 12/24/14  5:41 AM  Result Value Ref Range   Sodium 140 135 - 145 mmol/L   Potassium 4.0 3.5 - 5.1 mmol/L   Chloride 95 (L) 101 - 111 mmol/L   CO2 26 22 - 32 mmol/L   Glucose, Bld 82 70 - 99 mg/dL   BUN 9 6 - 20 mg/dL   Creatinine, Ser 0.76 0.61 - 1.24 mg/dL   Calcium 9.5 8.9 - 10.3 mg/dL   Total Protein 8.6 (H) 6.5 - 8.1 g/dL   Albumin 5.2 (H) 3.5 - 5.0 g/dL   AST 56 (H) 15 - 41 U/L    ALT 38 17 - 63 U/L   Alkaline Phosphatase 101 38 - 126 U/L   Total Bilirubin 0.9 0.3 - 1.2 mg/dL   GFR calc non Af Amer >60 >60 mL/min   GFR calc Af Amer >60 >60 mL/min  Comment: (NOTE) The eGFR has been calculated using the CKD EPI equation. This calculation has not been validated in all clinical situations. eGFR's persistently <60 mL/min signify possible Chronic Kidney Disease.    Anion gap 19 (H) 5 - 15  Lipase, blood     Status: None   Collection Time: 12/24/14  5:41 AM  Result Value Ref Range   Lipase 28 22 - 51 U/L  Ethanol     Status: Abnormal   Collection Time: 12/24/14  5:47 AM  Result Value Ref Range   Alcohol, Ethyl (B) 281 (H) <5 mg/dL    Comment:        LOWEST DETECTABLE LIMIT FOR SERUM ALCOHOL IS 11 mg/dL FOR MEDICAL PURPOSES ONLY   Acetaminophen level     Status: Abnormal   Collection Time: 12/24/14  5:47 AM  Result Value Ref Range   Acetaminophen (Tylenol), Serum <10 (L) 10 - 30 ug/mL    Comment:        THERAPEUTIC CONCENTRATIONS VARY SIGNIFICANTLY. A RANGE OF 10-30 ug/mL MAY BE AN EFFECTIVE CONCENTRATION FOR MANY PATIENTS. HOWEVER, SOME ARE BEST TREATED AT CONCENTRATIONS OUTSIDE THIS RANGE. ACETAMINOPHEN CONCENTRATIONS >150 ug/mL AT 4 HOURS AFTER INGESTION AND >50 ug/mL AT 12 HOURS AFTER INGESTION ARE OFTEN ASSOCIATED WITH TOXIC REACTIONS.   Salicylate level     Status: None   Collection Time: 12/24/14  5:47 AM  Result Value Ref Range   Salicylate Lvl <0.8 2.8 - 30.0 mg/dL  Drug screen panel, emergency     Status: Abnormal   Collection Time: 12/24/14  6:43 AM  Result Value Ref Range   Opiates NONE DETECTED NONE DETECTED   Cocaine NONE DETECTED NONE DETECTED   Benzodiazepines NONE DETECTED NONE DETECTED   Amphetamines NONE DETECTED NONE DETECTED   Tetrahydrocannabinol POSITIVE (A) NONE DETECTED   Barbiturates NONE DETECTED NONE DETECTED    Comment:        DRUG SCREEN FOR MEDICAL PURPOSES ONLY.  IF CONFIRMATION IS NEEDED FOR ANY PURPOSE,  NOTIFY LAB WITHIN 5 DAYS.        LOWEST DETECTABLE LIMITS FOR URINE DRUG SCREEN Drug Class       Cutoff (ng/mL) Amphetamine      1000 Barbiturate      200 Benzodiazepine   676 Tricyclics       195 Opiates          300 Cocaine          300 THC              50   Urinalysis, Routine w reflex microscopic     Status: Abnormal   Collection Time: 12/24/14  6:43 AM  Result Value Ref Range   Color, Urine YELLOW YELLOW   APPearance CLEAR CLEAR   Specific Gravity, Urine 1.012 1.005 - 1.030   pH 5.5 5.0 - 8.0   Glucose, UA NEGATIVE NEGATIVE mg/dL   Hgb urine dipstick NEGATIVE NEGATIVE   Bilirubin Urine NEGATIVE NEGATIVE   Ketones, ur 40 (A) NEGATIVE mg/dL   Protein, ur 100 (A) NEGATIVE mg/dL   Urobilinogen, UA 1.0 0.0 - 1.0 mg/dL   Nitrite NEGATIVE NEGATIVE   Leukocytes, UA NEGATIVE NEGATIVE  Urine microscopic-add on     Status: None   Collection Time: 12/24/14  6:43 AM  Result Value Ref Range   Squamous Epithelial / LPF RARE RARE   WBC, UA 0-2 <3 WBC/hpf   Urine-Other MUCOUS PRESENT     Vitals: Blood pressure 145/98, pulse 82, temperature 98.2  F (36.8 C), temperature source Oral, resp. rate 18, height _0  (1.803 m), weight 90.719 kg (200 lb), SpO2 96 %.  Risk to Self: Is patient at risk for suicide?: Yes Risk to Others:   Prior Inpatient Therapy:   Prior Outpatient Therapy:    Current Facility-Administered Medications  Medication Dose Route Frequency Provider Last Rate Last Dose  . acetaminophen (TYLENOL) tablet 650 mg  650 mg Oral Q6H PRN Benjamine Mola, FNP      . alum & mag hydroxide-simeth (MAALOX/MYLANTA) 200-200-20 MG/5ML suspension 30 mL  30 mL Oral Q4H PRN Benjamine Mola, FNP      . hydrOXYzine (ATARAX/VISTARIL) tablet 25 mg  25 mg Oral Q6H PRN Benjamine Mola, FNP      . ibuprofen (ADVIL,MOTRIN) tablet 600 mg  600 mg Oral Q8H PRN Patrecia Pour, NP   600 mg at 12/25/14 0759  . loperamide (IMODIUM) capsule 2-4 mg  2-4 mg Oral PRN Benjamine Mola, FNP      .  LORazepam (ATIVAN) tablet 1 mg  1 mg Oral Q6H PRN Benjamine Mola, FNP   1 mg at 12/25/14 1411  . multivitamin with minerals tablet 1 tablet  1 tablet Oral Daily Benjamine Mola, FNP   1 tablet at 12/25/14 1411  . nicotine (NICODERM CQ - dosed in mg/24 hours) patch 21 mg  21 mg Transdermal Daily Patrecia Pour, NP   21 mg at 12/25/14 0757  . ondansetron (ZOFRAN-ODT) disintegrating tablet 4 mg  4 mg Oral Q6H PRN Benjamine Mola, FNP      . thiamine (VITAMIN B-1) tablet 100 mg  100 mg Oral Daily Patrecia Pour, NP   100 mg at 12/25/14 2831   Or  . thiamine (B-1) injection 100 mg  100 mg Intravenous Daily Patrecia Pour, NP        Musculoskeletal: Strength & Muscle Tone: within normal limits Gait & Station: normal Patient leans: N/A  Psychiatric Specialty Exam: Physical Exam  Psychiatric: His mood appears anxious. His affect is blunt. His speech is delayed. He is slowed and withdrawn. Cognition and memory are normal. He expresses impulsivity. He exhibits a depressed mood. He expresses suicidal ideation.    Review of Systems  Constitutional: Positive for malaise/fatigue.  HENT: Negative.   Eyes: Negative.   Respiratory: Negative.   Cardiovascular: Negative.   Gastrointestinal: Positive for nausea.  Genitourinary: Negative.   Musculoskeletal: Positive for myalgias.  Skin: Negative.   Neurological: Positive for weakness.  Endo/Heme/Allergies: Negative.   Psychiatric/Behavioral: Positive for depression, suicidal ideas and substance abuse. The patient is nervous/anxious and has insomnia.     Blood pressure 145/98, pulse 82, temperature 98.2 F (36.8 C), temperature source Oral, resp. rate 18, height _1  (1.803 m), weight 90.719 kg (200 lb), SpO2 96 %.Body mass index is 27.91 kg/(m^2).  General Appearance: Disheveled  Eye Contact::  Minimal  Speech:  Slow  Volume:  Decreased  Mood:  Anxious and Depressed  Affect:  Constricted  Thought Process:  Disorganized  Orientation:  Full (Time,  Place, and Person)  Thought Content:  Hallucinations: Auditory  Suicidal Thoughts:  Yes.  with intent/plan and continues to state "cut my wrists"  Homicidal Thoughts:  No  Memory:  Immediate;   Fair Recent;   Good Remote;   Good  Judgement:  Impaired  Insight:  Lacking  Psychomotor Activity:  Decreased  Concentration:  Poor  Recall:  [employer] of Knowledge:Fair  Language: Fair  Akathisia:  No  Handed:  Right  AIMS (if indicated):     Assets:  Communication Skills Desire for Improvement Social Support  ADL's:  Intact  Cognition: WNL  Sleep:   poor   Treatment Plan Summary: Alcohol withdrawal managed with CIWA and ativan detox protocol   Daily contact with patient to assess and evaluate symptoms and progress in treatment and Medication management  Recommend psychiatric Inpatient admission when medically cleared.  Disposition: Admit to [facility] for inpatient hospitalization for safety and stabilization regarding suicidality, schizoaffective disorder, and alcohol withdrawal.   Guadelupe Sabin C,FNP-BC 12/25/2014 09:42 AM

## 2014-12-25 NOTE — Progress Notes (Signed)
Patient in bed at the beginning of this shift. AC reported that patient is been transferred to the Adult unit 300 bed-1. Patient endorsed suicide thoughts bet contracted for safety. He endorse a/v hallucinations related to his Schizoaffective disorder. He is calm and cooperative during assessment. Although patient said he had headache and rated it at 8 on a scale of 1-10 with 10 the worst. Also complaint of feeling nauseous. Writer administered Ibuprofen 600 mg for the headache and Zofran for nausea. Writer notified primary RN that patient will be brought to the unit. Q 15 unit check continues for safety.

## 2014-12-25 NOTE — Progress Notes (Signed)
Admission Note:  D: Patient is a 44 year old male who admitted voluntarily in no acute distress for the treatment of alcohol intoxication and suicide ideation. Patient mood is depressed with congruent affect. Patient stated "I am having alcohol withdrawals and  Suicide ideation" patient endorses SI with plan to cut and kill self. Patient also report AH. Voices telling him to hurt himself. Reports headache of 8/10. Was calm and cooperative with admission process. Patient listed the following symptoms - tremor, which was visible on stretching out his arms. Pin and needle sensations in the skin, anxiety, hot and cold flashes and pain. Patient reports drinking 8 tall beers of 24 oz each per day.  A: Skin assessed, mild rashes noted at the upper back and chest. Tattoos @ upper neck, R leg, both arms. POC and unit policies explained and understanding verbalized. Consents obtained. Food and fluids offered, patient accepted fluid.  R: patient contracted for safety. Patient had no additional questions or concerns.

## 2014-12-25 NOTE — Discharge Summary (Signed)
[name] OBS UNIT DISCHARGE SUMMARY   Patient Identification: [name] MRN:  [id number] Principal Diagnosis: Alcohol withdrawal Diagnosis:   Patient Active Problem List   Diagnosis Date Noted  . Alcohol withdrawal [F10.239] 12/25/2014    Priority: High  . Schizoaffective disorder, depressive type [F25.1]     Priority: High  . Alcohol use disorder, severe, dependence [F10.20] 11/29/2014  . Current smoker [Z72.0] 11/20/2014  . Tremors [G25.2] 11/03/2014  . Chronic posttraumatic stress disorder [F43.12]   . Protein-calorie malnutrition, severe [E43] 10/19/2014  . Hallucinations, visual [R44.1]   . Noncompliance with medication regimen [Z91.14]   . Hallucinations [R44.3] 07/07/2014  . Paranoia [F22] 07/07/2014  . Cannabis abuse [F12.10] 12/30/2013  . Schizoaffective disorder [F25.9] 12/29/2013  . Post traumatic stress disorder (PTSD) [F43.10] 11/03/2011    Total Time spent with patient: 45 minutes  Subjective:   [name] is a 44 y.o. male patient admitted with depression and alcohol withdrawal. Pt seen and chart reviewed. While pt is known to have a history of malingering behavior, there are 4 nursing notes that support subjective statements made by pt to this NP as well that he wants to "cut his wrists" and commit suicide. Additionally, he is withdrawing from alcohol although his CIWA is stable at this time under 8 consistently. Pt denies homicidal ideation yet continues to affirm suicidal ideation and   HPI: [name] is an 44 y.o. Male with history of schizoaffective disorder and alcohol use disorder-severe. He reports that he was brought to [facility] long ED by EMS activated by his girlfriend. Patient is here requesting for alcohol detox. He presents with suicidal thoughts, depression, hopelessness, low energy level, anhedonia, flat affect, poor concentration, anxiety, nervousness and auditory hallucinations, hearing voices of people laughing at him. Patient remains  suicidal today with a plan to cut himself with a knife or overdose on his medications.Currently, he denies delusional thinking,HI and visual hallucination. The Patient reports he binge drinks 6 to 8 "tall boys" beers daily. He reports consuming alcohol upon awakening, if not he will experience withdrawal symptoms. He reports consuming alcohol through out the day until he passes out at night. He reports smoking Cannabis 1x per month of 1 bowl.The Patient reports sleeping 2 hours per night and having a poor appetite, he has lost 10 lbs since 12-06-2014.Patient reports being admitted to [facility] 2x in the last 2 months, with last admission 11-29-2014 to 12-06-2014.The Patient reports poor compliance with after care. Patient will benefit from inpatient admission for stabilization, he is unable to contract for safety.  *Pt was sent to the [facility] OBS UNIT for safety and stabilization to determine disposition  HPI Elements:   Location:  depressed, alcohol use. Quality:  severe. Duration:  ongoing for months. Context:  non-complant with medications and alcohol use disorder.  Past Medical History:  Past Medical History  Diagnosis Date  . Psychosis   . PTSD (post-traumatic stress disorder)   . Seizure disorder     related to etoh seizure  . Alcohol abuse   . Schizoaffective disorder   . Anxiety     at age 15  . Depression     at age 89    Past Surgical History  Procedure Laterality Date  . Foot surgery      right  . Hand surgery     Family History:  Family History  Problem Relation Age of Onset  . Alcoholism Mother   . CAD Father   . Depression Brother    Social  History:  History  Alcohol Use  . Yes    Comment: "I'm a binge drinker.[name] 4-40 ounces/day"     History  Drug Use  . 1.00 per week  . Special: Marijuana    Comment: THC 2 to 3 times per month    History   Social History  . Marital Status: Divorced    Spouse Name: N/A  . Number of Children: N/A  . Years of Education: N/A    Social History Main Topics  . Smoking status: Current Every Day Smoker -- 1.50 packs/day for 24 years    Types: Cigarettes  . Smokeless tobacco: Never Used  . Alcohol Use: Yes     Comment: "I'm a binge drinker.[name] 4-40 ounces/day"  . Drug Use: 1.00 per week    Special: Marijuana     Comment: THC 2 to 3 times per month  . Sexual Activity: Yes   Other Topics Concern  . None   Social History Narrative   Additional Social History:                          Allergies:   Allergies  Allergen Reactions  . Wellbutrin [Bupropion] Other (See Comments)    Makes me feel like I'm have a seizure    Labs:  Results for orders placed or performed during the hospital encounter of 12/24/14 (from the past 48 hour(s))  CBC WITH DIFFERENTIAL     Status: Abnormal   Collection Time: 12/24/14  5:41 AM  Result Value Ref Range   WBC 5.1 4.0 - 10.5 K/uL   RBC 4.67 4.22 - 5.81 MIL/uL   Hemoglobin 16.0 13.0 - 17.0 g/dL   HCT 46.0 39.0 - 52.0 %   MCV 98.5 78.0 - 100.0 fL   MCH 34.3 (H) 26.0 - 34.0 pg   MCHC 34.8 30.0 - 36.0 g/dL   RDW 15.6 (H) 11.5 - 15.5 %   Platelets 308 150 - 400 K/uL   Neutrophils Relative % 41 (L) 43 - 77 %   Neutro Abs 2.1 1.7 - 7.7 K/uL   Lymphocytes Relative 44 12 - 46 %   Lymphs Abs 2.2 0.7 - 4.0 K/uL   Monocytes Relative 14 (H) 3 - 12 %   Monocytes Absolute 0.7 0.1 - 1.0 K/uL   Eosinophils Relative 0 0 - 5 %   Eosinophils Absolute 0.0 0.0 - 0.7 K/uL   Basophils Relative 1 0 - 1 %   Basophils Absolute 0.1 0.0 - 0.1 K/uL  Comprehensive metabolic panel     Status: Abnormal   Collection Time: 12/24/14  5:41 AM  Result Value Ref Range   Sodium 140 135 - 145 mmol/L   Potassium 4.0 3.5 - 5.1 mmol/L   Chloride 95 (L) 101 - 111 mmol/L   CO2 26 22 - 32 mmol/L   Glucose, Bld 82 70 - 99 mg/dL   BUN 9 6 - 20 mg/dL   Creatinine, Ser 0.76 0.61 - 1.24 mg/dL   Calcium 9.5 8.9 - 10.3 mg/dL   Total Protein 8.6 (H) 6.5 - 8.1 g/dL   Albumin 5.2 (H) 3.5 - 5.0 g/dL   AST  56 (H) 15 - 41 U/L   ALT 38 17 - 63 U/L   Alkaline Phosphatase 101 38 - 126 U/L   Total Bilirubin 0.9 0.3 - 1.2 mg/dL   GFR calc non Af Amer >60 >60 mL/min   GFR calc Af Amer >60 >60 mL/min  Comment: (NOTE) The eGFR has been calculated using the CKD EPI equation. This calculation has not been validated in all clinical situations. eGFR's persistently <60 mL/min signify possible Chronic Kidney Disease.    Anion gap 19 (H) 5 - 15  Lipase, blood     Status: None   Collection Time: 12/24/14  5:41 AM  Result Value Ref Range   Lipase 28 22 - 51 U/L  Ethanol     Status: Abnormal   Collection Time: 12/24/14  5:47 AM  Result Value Ref Range   Alcohol, Ethyl (B) 281 (H) <5 mg/dL    Comment:        LOWEST DETECTABLE LIMIT FOR SERUM ALCOHOL IS 11 mg/dL FOR MEDICAL PURPOSES ONLY   Acetaminophen level     Status: Abnormal   Collection Time: 12/24/14  5:47 AM  Result Value Ref Range   Acetaminophen (Tylenol), Serum <10 (L) 10 - 30 ug/mL    Comment:        THERAPEUTIC CONCENTRATIONS VARY SIGNIFICANTLY. A RANGE OF 10-30 ug/mL MAY BE AN EFFECTIVE CONCENTRATION FOR MANY PATIENTS. HOWEVER, SOME ARE BEST TREATED AT CONCENTRATIONS OUTSIDE THIS RANGE. ACETAMINOPHEN CONCENTRATIONS >150 ug/mL AT 4 HOURS AFTER INGESTION AND >50 ug/mL AT 12 HOURS AFTER INGESTION ARE OFTEN ASSOCIATED WITH TOXIC REACTIONS.   Salicylate level     Status: None   Collection Time: 12/24/14  5:47 AM  Result Value Ref Range   Salicylate Lvl <1.6 2.8 - 30.0 mg/dL  Drug screen panel, emergency     Status: Abnormal   Collection Time: 12/24/14  6:43 AM  Result Value Ref Range   Opiates NONE DETECTED NONE DETECTED   Cocaine NONE DETECTED NONE DETECTED   Benzodiazepines NONE DETECTED NONE DETECTED   Amphetamines NONE DETECTED NONE DETECTED   Tetrahydrocannabinol POSITIVE (A) NONE DETECTED   Barbiturates NONE DETECTED NONE DETECTED    Comment:        DRUG SCREEN FOR MEDICAL PURPOSES ONLY.  IF CONFIRMATION IS  NEEDED FOR ANY PURPOSE, NOTIFY LAB WITHIN 5 DAYS.        LOWEST DETECTABLE LIMITS FOR URINE DRUG SCREEN Drug Class       Cutoff (ng/mL) Amphetamine      1000 Barbiturate      200 Benzodiazepine   109 Tricyclics       604 Opiates          300 Cocaine          300 THC              50   Urinalysis, Routine w reflex microscopic     Status: Abnormal   Collection Time: 12/24/14  6:43 AM  Result Value Ref Range   Color, Urine YELLOW YELLOW   APPearance CLEAR CLEAR   Specific Gravity, Urine 1.012 1.005 - 1.030   pH 5.5 5.0 - 8.0   Glucose, UA NEGATIVE NEGATIVE mg/dL   Hgb urine dipstick NEGATIVE NEGATIVE   Bilirubin Urine NEGATIVE NEGATIVE   Ketones, ur 40 (A) NEGATIVE mg/dL   Protein, ur 100 (A) NEGATIVE mg/dL   Urobilinogen, UA 1.0 0.0 - 1.0 mg/dL   Nitrite NEGATIVE NEGATIVE   Leukocytes, UA NEGATIVE NEGATIVE  Urine microscopic-add on     Status: None   Collection Time: 12/24/14  6:43 AM  Result Value Ref Range   Squamous Epithelial / LPF RARE RARE   WBC, UA 0-2 <3 WBC/hpf   Urine-Other MUCOUS PRESENT     Vitals: Blood pressure 145/98, pulse 82, temperature 98.2  F (36.8 C), temperature source Oral, resp. rate 18, height 5' 11"  (1.803 m), weight 90.719 kg (200 lb), SpO2 96 %.  Risk to Self: Is patient at risk for suicide?: Yes Risk to Others:   Prior Inpatient Therapy:   Prior Outpatient Therapy:    Current Facility-Administered Medications  Medication Dose Route Frequency Provider Last Rate Last Dose  . acetaminophen (TYLENOL) tablet 650 mg  650 mg Oral Q6H PRN Benjamine Mola, FNP      . alum & mag hydroxide-simeth (MAALOX/MYLANTA) 200-200-20 MG/5ML suspension 30 mL  30 mL Oral Q4H PRN Benjamine Mola, FNP      . hydrOXYzine (ATARAX/VISTARIL) tablet 25 mg  25 mg Oral Q6H PRN Benjamine Mola, FNP      . ibuprofen (ADVIL,MOTRIN) tablet 600 mg  600 mg Oral Q8H PRN Patrecia Pour, NP   600 mg at 12/25/14 0759  . loperamide (IMODIUM) capsule 2-4 mg  2-4 mg Oral PRN Benjamine Mola, FNP      . LORazepam (ATIVAN) tablet 1 mg  1 mg Oral Q6H PRN Benjamine Mola, FNP   1 mg at 12/25/14 1411  . multivitamin with minerals tablet 1 tablet  1 tablet Oral Daily Benjamine Mola, FNP   1 tablet at 12/25/14 1411  . nicotine (NICODERM CQ - dosed in mg/24 hours) patch 21 mg  21 mg Transdermal Daily Patrecia Pour, NP   21 mg at 12/25/14 0757  . ondansetron (ZOFRAN-ODT) disintegrating tablet 4 mg  4 mg Oral Q6H PRN Benjamine Mola, FNP      . thiamine (VITAMIN B-1) tablet 100 mg  100 mg Oral Daily Patrecia Pour, NP   100 mg at 12/25/14 8119   Or  . thiamine (B-1) injection 100 mg  100 mg Intravenous Daily Patrecia Pour, NP        Musculoskeletal: Strength & Muscle Tone: within normal limits Gait & Station: normal Patient leans: N/A  Psychiatric Specialty Exam: Physical Exam  Psychiatric: His mood appears anxious. His affect is blunt. His speech is delayed. He is slowed and withdrawn. Cognition and memory are normal. He expresses impulsivity. He exhibits a depressed mood. He expresses suicidal ideation.    Review of Systems  Constitutional: Positive for malaise/fatigue.  HENT: Negative.   Eyes: Negative.   Respiratory: Negative.   Cardiovascular: Negative.   Gastrointestinal: Positive for nausea.  Genitourinary: Negative.   Musculoskeletal: Positive for myalgias.  Skin: Negative.   Neurological: Positive for weakness.  Endo/Heme/Allergies: Negative.   Psychiatric/Behavioral: Positive for depression, suicidal ideas and substance abuse. The patient is nervous/anxious and has insomnia.     Blood pressure 145/98, pulse 82, temperature 98.2 F (36.8 C), temperature source Oral, resp. rate 18, height 5' 11"  (1.803 m), weight 90.719 kg (200 lb), SpO2 96 %.Body mass index is 27.91 kg/(m^2).  General Appearance: Disheveled  Eye Contact::  Minimal  Speech:  Slow  Volume:  Decreased  Mood:  Anxious and Depressed  Affect:  Constricted  Thought Process:  Disorganized   Orientation:  Full (Time, Place, and Person)  Thought Content:  Hallucinations: Auditory  Suicidal Thoughts:  Yes.  with intent/plan and continues to state "cut my wrists"  Homicidal Thoughts:  No  Memory:  Immediate;   Fair Recent;   Good Remote;   Good  Judgement:  Impaired  Insight:  Lacking  Psychomotor Activity:  Decreased  Concentration:  Poor  Recall:  [employer] of Knowledge:Fair  Language: Fair  Akathisia:  No  Handed:  Right  AIMS (if indicated):     Assets:  Communication Skills Desire for Improvement Social Support  ADL's:  Intact  Cognition: WNL  Sleep:   poor   Treatment Plan Summary: Alcohol withdrawal managed with CIWA and ativan detox protocol   Daily contact with patient to assess and evaluate symptoms and progress in treatment and Medication management  Recommend psychiatric Inpatient admission when medically cleared.  Disposition: Admit to [facility] for inpatient hospitalization for safety and stabilization regarding suicidality, schizoaffective disorder, and alcohol withdrawal.   Guadelupe Sabin C,FNP-BC 12/25/2014 04:54 PM

## 2014-12-25 NOTE — BH Assessment (Signed)
Voluntary consent form has been completed and faxed to [facility].

## 2014-12-25 NOTE — Progress Notes (Signed)
Received report from [place]. D: Client went directly to the room, refused group, took a shower went to bed, but eventually up to get a snack. A: Writer reviewed medications, administered as prescribed. Staff will monitor q36min for safety. R:Client is safe on the unit.

## 2014-12-25 NOTE — Progress Notes (Signed)
Did not attend group, arrived a few minutes prior and took shower instead.
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
## 2014-12-25 NOTE — Progress Notes (Signed)
D:Pt has slept on and off throughout the day. While awake pt reported passive si thoughts and detox symptoms of chills, tremors, anxiety, and feeling of bugs crawling. A:Gave prn medication for withdrawal. Offered support and 15 minute checks. R:Safety maintained in the Observation Unit.

## 2014-12-25 NOTE — [facility] Counselor (Signed)
Counselor spoke with pt at his bedside in the OBS unit. Counselor processed with pt on his reason fro admittance to the hospital and a possible discharge plan. Pt shared that he is here for alcohol detox and reports that he has been down this road before, but he really wants some help. Pt shared that he currently has a Teacher, music through [employer] who he is compliant with. Pt reports that he has been diagnosed with Schizoaffective Disorder. Pt shared that he is prescribed his medications through [facility]. Pt also shared that he would like to go back to [facility] again for Detox treatment. Pt currently reports that he is experiencing thoughts of suicide, but does not have an active plan. Pt reports that his siblings are supportive of his recovery process. Counselor provided encouragement and support to pt. Counselor will report this information to Day shift Counselor to start the disposition process.   Redmond Pulling, MA OBS Counselor

## 2014-12-25 NOTE — Progress Notes (Signed)
[facility] Assessment Progress Note  Counselor spoke with pt about discharge planning. Counselor advised pt that an appt had been made for him at [facility] for Tuesday, 12/26/14 at 8am. Pt indicated that he wasn't even sure what he wanted anymore. He stated that he just didn't want to live anymore with a plan to cut himself with a knife, which he has access to in his home. Pt presented very tearful. Counselor processed with pt concerning his SI. Pt indicated that he was depressed b/c of the voices that he heard. He said the voices laughed, berated and screamed at him, as well as telling him that he would be better off dead. Pt acknowledged that he is med compliant with his psychotropic meds, and that they help, but they don't get rid of the voices or depression. Pt indicated that the only thing that gets rid of the voices and the depression is drinking, b/c he just "blacks out". He also acknowledges that he understands that drinking is not really helping, it's just allowing him to black out and be temporarily unfeeling of pain.   Counselor spoke with pt about his family and supports. He identified a brother and sister that both live locally and a mom [facility]) and daughter ([place] who live out of state. He indicated that they are all supportive of him. Counselor used pt's identified supports as leverage when speaking with pt about SI not being a good idea to pursue. Counselor also discussed pt joining a support group so as not to feel alone in his mental illness. Pt indicated knowledge of [facility] and appeared slightly interested in the idea of joining a support group.    Counselor again reiterated to pt about his appt with [facility] and encouraged him to attend, if discharged.   Marisa Cyphers, MS, [facility] Counselor

## 2014-12-25 NOTE — ED Notes (Signed)
Pt transported to [facility] by [facility] transportation service for continuation of specialized care. Belongings given to driver after patient signed for them. Pt left in no acute distress. 

## 2014-12-25 NOTE — BH Assessment (Deleted)
Counselor spoke with pt at his bedside in the OBS unit. Counselor processed with pt on his reason fro admittance to the hospital and a possible discharge plan. Pt shared that he is here for alcohol detox and reports that he has been down this road before, but he really wants some help. Pt shared that he currently has a Teacher, music through [name] who he is compliant with. Pt reports that he has been diagnosed with Schizoaffective Disorder. Pt shared that he is prescribed his medications through [facility]. Pt also shared that he would like to go back to [facility] again for Detox treatment. Pt currently reports that he is experiencing thoughts of suicide, but does not have an active plan. Pt reports that his siblings are supportive of his recovery process. Counselor provided encouragement and support to pt. Counselor will report this information to Day shift Counselor to start the disposition process.   Redmond Pulling, MA OBS Counselor

## 2014-12-25 NOTE — ED Notes (Signed)
Patient admits to [name] with a plan to cut himself with a knife. Patient denies HI and AVH at this time. Plan of care discussed with patient. Patient voices no complaints or concerns at this time. Encouragement and support provided and safety maintain. Q 15 min safety checks remain.

## 2014-12-25 NOTE — [facility] Counselor (Signed)
Counselor made call to [facility] to check bed availability for this pt and was informed by 3rd shift night staff member Gabriel Cirri that information is not known and to call back after 8am this morning.    Redmond Pulling, MA OBS Counselor

## 2014-12-25 NOTE — Progress Notes (Addendum)
[facility] INPATIENT:  Family/Significant Other Suicide Prevention Education  Suicide Prevention Education:  Patient Refusal for Family/Significant Other Suicide Prevention Education: The patient [name] has refused to provide written consent for family/significant other to be provided Family/Significant Other Suicide Prevention Education during admission and/or prior to discharge.  Physician notified.  Loyal Gambler B 12/25/2014, 4:11 AM 12/25/2014,4:11 AM

## 2014-12-25 NOTE — ED Provider Notes (Signed)
Patient has been accepted at [facility] by Dr. [name].   Delora Fuel, MD [id number]

## 2014-12-26 DIAGNOSIS — F10232 Alcohol dependence with withdrawal with perceptual disturbance: Secondary | ICD-10-CM

## 2014-12-26 MED ORDER — TRAZODONE HCL 50 MG PO TABS
50.0000 mg | ORAL_TABLET | Freq: Every evening | ORAL | Status: DC | PRN
Start: 2014-12-26 — End: 2014-12-26

## 2014-12-26 MED ORDER — LORAZEPAM 1 MG PO TABS
1.0000 mg | ORAL_TABLET | Freq: Four times a day (QID) | ORAL | Status: AC
  Administered 2014-12-26 – 2014-12-27 (×4): 1 mg via ORAL
  Filled 2014-12-26 (×4): qty 1

## 2014-12-26 MED ORDER — LORAZEPAM 1 MG PO TABS
1.0000 mg | ORAL_TABLET | Freq: Three times a day (TID) | ORAL | Status: AC
  Administered 2014-12-27 – 2014-12-28 (×3): 1 mg via ORAL
  Filled 2014-12-26 (×3): qty 1

## 2014-12-26 MED ORDER — ZOLPIDEM TARTRATE 10 MG PO TABS
10.0000 mg | ORAL_TABLET | Freq: Every evening | ORAL | Status: DC | PRN
  Administered 2014-12-26 – 2014-12-29 (×4): 10 mg via ORAL
  Filled 2014-12-26 (×4): qty 1

## 2014-12-26 MED ORDER — LORAZEPAM 1 MG PO TABS
1.0000 mg | ORAL_TABLET | Freq: Every day | ORAL | Status: AC
  Administered 2014-12-30: 1 mg via ORAL
  Filled 2014-12-26: qty 1

## 2014-12-26 MED ORDER — GABAPENTIN 400 MG PO CAPS
400.0000 mg | ORAL_CAPSULE | Freq: Every day | ORAL | Status: DC
  Administered 2014-12-26 – 2014-12-29 (×4): 400 mg via ORAL
  Filled 2014-12-26 (×6): qty 1

## 2014-12-26 MED ORDER — TRAZODONE HCL 50 MG PO TABS
50.0000 mg | ORAL_TABLET | Freq: Every evening | ORAL | Status: DC | PRN
  Administered 2014-12-26: 50 mg via ORAL
  Filled 2014-12-26 (×3): qty 1
  Filled 2014-12-26 (×2): qty 28
  Filled 2014-12-26 (×2): qty 1
  Filled 2014-12-26: qty 28
  Filled 2014-12-26: qty 1
  Filled 2014-12-26: qty 28
  Filled 2014-12-26 (×5): qty 1

## 2014-12-26 MED ORDER — SERTRALINE HCL 50 MG PO TABS
50.0000 mg | ORAL_TABLET | Freq: Every day | ORAL | Status: DC
  Administered 2014-12-26 – 2014-12-30 (×5): 50 mg via ORAL
  Filled 2014-12-26: qty 1
  Filled 2014-12-26: qty 14
  Filled 2014-12-26 (×3): qty 1
  Filled 2014-12-26: qty 14
  Filled 2014-12-26 (×3): qty 1

## 2014-12-26 MED ORDER — ZIPRASIDONE HCL 80 MG PO CAPS
80.0000 mg | ORAL_CAPSULE | Freq: Two times a day (BID) | ORAL | Status: DC
  Administered 2014-12-26 – 2014-12-30 (×8): 80 mg via ORAL
  Filled 2014-12-26 (×3): qty 1
  Filled 2014-12-26: qty 28
  Filled 2014-12-26: qty 1
  Filled 2014-12-26: qty 28
  Filled 2014-12-26 (×3): qty 1
  Filled 2014-12-26 (×2): qty 28
  Filled 2014-12-26 (×3): qty 1

## 2014-12-26 MED ORDER — GABAPENTIN 400 MG PO CAPS
400.0000 mg | ORAL_CAPSULE | Freq: Three times a day (TID) | ORAL | Status: DC
  Administered 2014-12-26 – 2014-12-30 (×13): 400 mg via ORAL
  Filled 2014-12-26 (×2): qty 1
  Filled 2014-12-26 (×2): qty 56
  Filled 2014-12-26: qty 1
  Filled 2014-12-26: qty 56
  Filled 2014-12-26 (×10): qty 1
  Filled 2014-12-26: qty 56
  Filled 2014-12-26: qty 1
  Filled 2014-12-26 (×2): qty 56
  Filled 2014-12-26 (×2): qty 1

## 2014-12-26 MED ORDER — LORAZEPAM 1 MG PO TABS
1.0000 mg | ORAL_TABLET | Freq: Two times a day (BID) | ORAL | Status: AC
  Administered 2014-12-28 – 2014-12-29 (×2): 1 mg via ORAL
  Filled 2014-12-26 (×2): qty 1

## 2014-12-26 NOTE — Progress Notes (Signed)
Patient ID: [name], male   DOB: [birth year], 44 y.o.   MRN: [id number] PER STATE REGULATIONS 482.30  THIS CHART WAS REVIEWED FOR MEDICAL NECESSITY WITH RESPECT TO THE PATIENT'S ADMISSION/DURATION OF STAY.  NEXT REVIEW DATE: 12/29/14  Roma Schanz, RN, BSN CASE MANAGER

## 2014-12-26 NOTE — [facility] Suicide Risk Assessment (Signed)
[facility] Admission Suicide Risk Assessment   Nursing information obtained from:    Demographic factors:    Current Mental Status:    Loss Factors:    Historical Factors:    Risk Reduction Factors:    Total Time spent with patient: 45 minutes Principal Problem: Alcohol withdrawal Diagnosis:   Patient Active Problem List   Diagnosis Date Noted  . Schizoaffective disorder [F25.9] 12/29/2013    Priority: High  . Alcohol withdrawal [F10.239] 12/25/2014  . Schizoaffective disorder, depressive type [F25.1]   . Alcohol use disorder, severe, dependence [F10.20] 11/29/2014  . Current smoker [Z72.0] 11/20/2014  . Tremors [G25.2] 11/03/2014  . Chronic posttraumatic stress disorder [F43.12]   . Protein-calorie malnutrition, severe [E43] 10/19/2014  . Hallucinations, visual [R44.1]   . Noncompliance with medication regimen [Z91.14]   . Hallucinations [R44.3] 07/07/2014  . Paranoia [F22] 07/07/2014  . Cannabis abuse [F12.10] 12/30/2013  . Post traumatic stress disorder (PTSD) [F43.10] 11/03/2011     Continued Clinical Symptoms:  Alcohol Use Disorder Identification Test Final Score (AUDIT): 15 The "Alcohol Use Disorders Identification Test", Guidelines for Use in Primary Care, Second Edition.  World Pharmacologist [facility]). Score between 0-7:  no or low risk or alcohol related problems. Score between 8-15:  moderate risk of alcohol related problems. Score between 16-19:  high risk of alcohol related problems. Score 20 or above:  warrants further diagnostic evaluation for alcohol dependence and treatment.   CLINICAL FACTORS:   Alcohol/Substance Abuse/Dependencies   Psychiatric Specialty Exam: Physical Exam  ROS  Blood pressure 126/82, pulse 68, temperature 97.7 F (36.5 C), temperature source Oral, resp. rate 16, height 5\' 11"  (1.803 m), weight 90.719 kg (200 lb), SpO2 98 %.Body mass index is 27.91 kg/(m^2).    COGNITIVE FEATURES THAT CONTRIBUTE TO RISK:  Closed-mindedness, Polarized  thinking and Thought constriction (tunnel vision)    SUICIDE RISK:   Moderate:  Frequent suicidal ideation with limited intensity, and duration, some specificity in terms of plans, no associated intent, good self-control, limited dysphoria/symptomatology, some risk factors present, and identifiable protective factors, including available and accessible social support.  PLAN OF CARE: Supportive approach/coping skills                              Alcohol dependence; ativan detox protocol/work a relapse prevention plan                              Exacerbation of his psychotic disorder; resume his Geodon/Zoloft                              Back pain; resume his Neurontin                               Insomnia; will use Ambien 10 mg HS PRN sleep                               Explore residential treatment programs Medical Decision Making:  Review of Psycho-Social Stressors (1), Review or order clinical lab tests (1), Review of Medication Regimen & Side Effects (2) and Review of New Medication or Change in Dosage (2)  I certify that inpatient services furnished can reasonably be expected to improve the patient's condition.  Fabyan Loughmiller A 12/26/2014, 6:46 PM

## 2014-12-26 NOTE — Tx Team (Signed)
Interdisciplinary Treatment Plan Update (Adult) Date: 12/26/2014   Time Reviewed: 9:30 AM  Progress in Treatment: Attending groups: Continuing to assess, patient new to milieu Participating in groups: Continuing to assess, patient new to milieu Taking medication as prescribed: Yes Tolerating medication: Yes Family/Significant other contact made: Continuing to assess, patient new to milieu Patient understands diagnosis: Yes Discussing patient identified problems/goals with staff: Yes Medical problems stabilized or resolved: Yes Denies suicidal/homicidal ideation: Yes Issues/concerns per patient self-inventory: Yes Other:  New problem(s) identified: N/A  Discharge Plan or Barriers:   5/10: CSW continuing to assess. Patient with multiple previous admissions, history of non-compliance with treatment and medication compliance. Lives alone and has limited support. Has received services from [facility] in the past.  Reason for Continuation of Hospitalization:  Depression Anxiety Medication Stabilization   Comments: N/A  Estimated length of stay: 3-5 days  For review of initial/current patient goals, please see plan of care.  Patient is a 44 year old Male admitted for SI, ETOH detox, and schizoaffective disorder with AH. Patient lives in [place] alone. Patient will benefit from crisis stabilization, medication evaluation, group therapy, and psycho education in addition to case management for discharge planning. Patient and CSW reviewed pt's identified goals and treatment plan. Pt verbalized understanding and agreed to treatment plan.   Attendees: Patient:    Family:    Physician: Dr. Parke Poisson; Dr. Sabra Heck 12/26/2014 9:30 AM  Nursing: Eduard Roux, Mayra Neer, Loletta Specter, RN 12/26/2014 9:30 AM  Clinical Social Worker: Tilden Fossa,  [place] 12/26/2014 9:30 AM  Other: Joette Catching, LCSW 12/26/2014 9:30 AM  Other: Lucinda Dell, Beverly Sessions Liaison 12/26/2014 9:30 AM  Other:  Lars Pinks, Case Manager 12/26/2014 9:30 AM  Other: Ave Filter, NP 12/26/2014 9:30 AM  Other:    Other:    Other:      Scribe for Treatment Team:  Tilden Fossa, MSW, [place] [phone]

## 2014-12-26 NOTE — BHH Suicide Risk Assessment (Signed)
[place] INPATIENT:  Family/Significant Other Suicide Prevention Education  Suicide Prevention Education:  Education Completed; mother Cher Nakai [phone],  (name of family member/significant other) has been identified by the patient as the family member/significant other with whom the patient will be residing, and identified as the person(s) who will aid the patient in the event of a mental health crisis (suicidal ideations/suicide attempt).  With written consent from the patient, the family member/significant other has been provided the following suicide prevention education, prior to the and/or following the discharge of the patient.  The suicide prevention education provided includes the following:  Suicide risk factors  Suicide prevention and interventions  National Suicide Hotline telephone number  [facility] assessment telephone number  [facility] [place] and/or Residential Mobile Crisis Unit telephone number  Request made of family/significant other to:  Remove weapons (e.g., guns, rifles, knives), all items previously/currently identified as safety concern.    Remove drugs/medications (over-the-counter, prescriptions, illicit drugs), all items previously/currently identified as a safety concern.  The family member/significant other verbalizes understanding of the suicide prevention education information provided.  The family member/significant other agrees to remove the items of safety concern listed above.  Mayola Mcbain, [name] 12/26/2014, 3:22 PM

## 2014-12-26 NOTE — BHH Group Notes (Signed)
Adult Psychoeducational Group Note  Date:  12/26/2014 Time:  9:00 AM  Group Topic/Focus:  Recovery Goals:   The focus of this group is to identify appropriate goals for recovery and establish a plan to achieve them.  Participation Level:  Active  Participation Quality:  Appropriate  Affect:  Appropriate  Cognitive:  Alert and Oriented  Insight: Appropriate  Engagement in Group:  Engaged  Modes of Intervention:  Discussion  Additional Comments: Patient's goal is to get started back on his psych medications.  Eduard Roux E 12/26/2014, 5:20 PM
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
## 2014-12-26 NOTE — Progress Notes (Signed)
Patient ID: Cal Gindlesperger, male   DOB: August 12, 1971, 44 y.o.   MRN: [id number] D: Client reports anxiety and depression as "5" of 10. Client denies diarrhea, since this am. Client reports voices "they talk down to me, but they don't tell me to hurt myself or anything" A: Writer provided emotional support encouraged client to report any concerns. Writer reviewed medication, administered as prescribed. Staff will monitor for safety q72min. R: Client is safe on the unit, attended group.

## 2014-12-26 NOTE — Progress Notes (Signed)
Recreation Therapy Notes  Animal-Assisted Activity (AAA) Program Checklist/Progress Notes Patient Eligibility Criteria Checklist & Daily Group note for Rec Tx Intervention  Date: 05.10.2016 Time: 2:45pm Location: [address]    AAA/T Program Assumption of Risk Form signed by Patient/ or Parent Legal Guardian yes  Patient is free of allergies or sever asthma yes  Patient reports no fear of animals yes  Patient reports no history of cruelty to animals yes  Patient understands his/her participation is voluntary yes  Behavioral Response: Did not attend.   Laureen Ochs Vlasta Baskin, LRT/CTRS  Elzabeth Mcquerry L 12/26/2014 4:10 PM

## 2014-12-26 NOTE — Progress Notes (Signed)
NUTRITION ASSESSMENT  Pt identified as at risk on the Malnutrition Screen Tool  INTERVENTION: 1. Educated patient on the importance of nutrition and encouraged intake of food and beverages. 2. Discussed weight goals. 3. Supplements: none at this time  NUTRITION DIAGNOSIS: Unintentional weight loss related to sub-optimal intake as evidenced by pt report.   Goal: Pt to meet >/= 90% of their estimated nutrition needs.  Monitor:  PO intake  Assessment:  Pt seen for MST. Pt reports that he was just d/c'ed from [facility] on 12/06/14. He states that he gained weight during his last admission and feels that he may have lost a few pounds between admissions but is not sure how much it may have been. Per chart review, pt has lost 4 lbs in 2 months. He states that since d/c he has not been eating much and has mainly been drinking alcohol. Pt reports that he did not have abdominal pain or nausea PTA but since admission, without alcohol, he has been experiencing these issues.   Pt reports that he ate eggs, bacon, biscuit with gravy for breakfast. He states that prior to breakfast he was feeling slightly hungry but also nauseous.   Pt does not have any questions at this time. Not meeting needs. Labs and medications reviewed.  44 y.o. male  Height: Ht Readings from Last 1 Encounters:  12/25/14 5\' 11"  (1.803 m)    Weight: Wt Readings from Last 1 Encounters:  12/25/14 200 lb (90.719 kg)    Weight Hx: Wt Readings from Last 10 Encounters:  12/25/14 200 lb (90.719 kg)  11/29/14 194 lb (87.998 kg)  11/20/14 195 lb (88.451 kg)  11/03/14 204 lb (92.534 kg)  11/03/14 204 lb (92.534 kg)  11/02/14 204 lb (92.534 kg)  10/24/14 198 lb (89.812 kg)  05/04/14 185 lb (83.915 kg)  02/09/14 182 lb (82.555 kg)  12/29/13 200 lb (90.719 kg)    BMI:  Body mass index is 27.91 kg/(m^2). Pt meets criteria for overweight based on current BMI.  Estimated Nutritional Needs: Kcal: 25-30 kcal/kg Protein: > 1 gram  protein/kg Fluid: 1 ml/kcal  Diet Order: Diet regular Room service appropriate?: Yes; Fluid consistency:: Thin Pt is also offered choice of unit snacks mid-morning and mid-afternoon.  Pt is eating as desired.   Lab results and medications reviewed.    Jarome Matin, RD, LDN Inpatient Clinical Dietitian Pager # [phone] After hours/weekend pager # [phone]
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
## 2014-12-26 NOTE — H&P (Signed)
Psychiatric Admission Assessment Adult  Patient Identification: [name] MRN:  [id number] Date of Evaluation:  12/26/2014 Chief Complaint:  SCHIZOAFFECTIVE DISORDER Principal Diagnosis: Alcohol withdrawal Diagnosis:   Patient Active Problem List   Diagnosis Date Noted  . Schizoaffective disorder [F25.9] 12/29/2013    Priority: High  . Alcohol withdrawal [F10.239] 12/25/2014  . Schizoaffective disorder, depressive type [F25.1]   . Alcohol use disorder, severe, dependence [F10.20] 11/29/2014  . Current smoker [Z72.0] 11/20/2014  . Tremors [G25.2] 11/03/2014  . Chronic posttraumatic stress disorder [F43.12]   . Protein-calorie malnutrition, severe [E43] 10/19/2014  . Hallucinations, visual [R44.1]   . Noncompliance with medication regimen [Z91.14]   . Hallucinations [R44.3] 07/07/2014  . Paranoia [F22] 07/07/2014  . Cannabis abuse [F12.10] 12/30/2013  . Post traumatic stress disorder (PTSD) [F43.10] 11/03/2011   History of Present Illness:: 44 Y/o male who states he was here by accident. States that he started drinking two days after he left here last time. States he got messed up with a girl who liked to drink. He met her while at the unit. She was not committed to stop drinking and triggered his relapse. Has been drinking for couple of weeks "hard and heavy" they shared a half gallon of rum or vodka. Has been drinking every day. States he is getting withdrawal starting Sunday night, Monday morning. States that he has been taking the medications but he is drinking with them. Understands that the medications cant work well if he was to continue to drink The initial assessment at the ED was as follows: [name] is an 44 y.o. male, reports his Girl Friend called EMS to bring him to [facility]. Patient presented, orientated x4, mood "depressed" and "I'm in withdrawal" from alcohol, affect flat, experiencing current AH of "laughter", current SI with a plan to cut himself with a knife.  The Patient denied current HI and VH. The Patient reports he binge drinks 6 to 8 "tall boys" beers daily. He reports consuming alcohol upon awakening, if not he will experience withdrawal symptoms. He reports consuming alcohol through out the day until he passes out at night. He reports smoking Cannabis 1x per month of 1 bowl. The Patient reports sleeping 2 hours per night and having a poor appetite and losing 10 lbs since 12-06-2014. The Patient reports feeling "sad, depressed, isolating" and having a mental health diagnosis of Schizoaffective Disorder. The Patient reports being prescribed Geodon, Zoloft, and Gabapentin and not being medication compliant. The Patient reports being admitted to [facility] 2x with last admission 11-29-2014 to 12-06-2014. The Patient reports not following up with discharge planning to go to [facility] for services. The Patient reports living along and receiving disability. He reports his Brother and Sister, who lives locally, are supportive. The Patient reports wanting inpatient treatment for alcohol use and Schizoaffective Disorder.    Elements:  Location:  Schizoaffective disorder, alcohol dependence. Quality:  unable to function having SI. Severity:  severe. Timing:  every day. Duration:  for the last 2 weeks since he was D/C last time. Context:  has been drinking not compliant wiht his medications experiencing exacerbation of his depression with SI. Associated Signs/Symptoms: Depression Symptoms:  depressed mood, anhedonia, insomnia, psychomotor retardation, fatigue, feelings of worthlessness/guilt, difficulty concentrating, hopelessness, suicidal thoughts with specific plan, anxiety, panic attacks, loss of energy/fatigue, disturbed sleep, weight loss, (Hypo) Manic Symptoms:  Irritable Mood, Labiality of Mood, Anxiety Symptoms:  Excessive Worry, Panic Symptoms, Psychotic Symptoms:  Hallucinations: Auditory Visual traces noises PTSD  Symptoms: Re-experiencing:  Flashbacks  Intrusive Thoughts Hypervigilance:  Yes Hyperarousal:  Increased Startle Response Total Time spent with patient: 45 minutes  Past Medical History:  Past Medical History  Diagnosis Date  . Psychosis   . PTSD (post-traumatic stress disorder)   . Seizure disorder     related to etoh seizure  . Alcohol abuse   . Schizoaffective disorder   . Anxiety     at age 29  . Depression     at age 48    Past Surgical History  Procedure Laterality Date  . Foot surgery      right  . Hand surgery     Family History:  Family History  Problem Relation Age of Onset  . Alcoholism Mother   . CAD Father   . Depression Brother    Social History:  History  Alcohol Use  . Yes    Comment: "I'm a binge drinker.[name] 4-40 ounces/day"     History  Drug Use  . 1.00 per week  . Special: Marijuana    Comment: THC 2 to 3 times per month    History   Social History  . Marital Status: Divorced    Spouse Name: N/A  . Number of Children: N/A  . Years of Education: N/A   Social History Main Topics  . Smoking status: Current Every Day Smoker -- 1.50 packs/day for 24 years    Types: Cigarettes  . Smokeless tobacco: Never Used  . Alcohol Use: Yes     Comment: "I'm a binge drinker.[name] 4-40 ounces/day"  . Drug Use: 1.00 per week    Special: Marijuana     Comment: THC 2 to 3 times per month  . Sexual Activity: Yes   Other Topics Concern  . None   Social History Narrative  Lives by himself, gets disability, associated in [place], [place] up until 2012. Has a daughter in [place]. States that his mother is in [place] and is planning to come and help him pack and take him to [place] Additional Social History:                          Musculoskeletal: Strength & Muscle Tone: within normal limits Gait & Station: normal Patient leans: N/A  Psychiatric Specialty Exam: Physical Exam  Review of Systems  Constitutional: Positive for malaise/fatigue.  Eyes:  Negative.   Respiratory: Positive for cough and shortness of breath.        Pack and a half   Cardiovascular: Positive for palpitations.  Gastrointestinal: Positive for heartburn.  Genitourinary: Negative.   Musculoskeletal: Positive for back pain and neck pain.  Skin: Positive for itching.  Neurological: Positive for dizziness, tremors, weakness and headaches.  Endo/Heme/Allergies: Negative.   Psychiatric/Behavioral: Positive for depression, suicidal ideas, hallucinations and substance abuse. The patient is nervous/anxious and has insomnia.     Blood pressure 135/94, pulse 87, temperature 98.5 F (36.9 C), temperature source Oral, resp. rate 16, height 5' 11"  (1.803 m), weight 90.719 kg (200 lb), SpO2 96 %.Body mass index is 27.91 kg/(m^2).  General Appearance: Disheveled  Eye Sport and exercise psychologist::  Fair  Speech:  Clear and Coherent, Slow and not spontaneous  Volume:  Decreased  Mood:  Depressed  Affect:  Depressed and Restricted  Thought Process:  Coherent and Goal Directed  Orientation:  Full (Time, Place, and Person)  Thought Content:  events symptoms worries concerns  Suicidal Thoughts:  Yes.  without intent/plan  Homicidal Thoughts:  No  Memory:  Immediate;   Fair Recent;   Fair Remote;   Fair  Judgement:  Fair  Insight:  Present and Shallow  Psychomotor Activity:  Decreased  Concentration:  Fair  Recall:  [employer] of Knowledge:Fair  Language: Fair  Akathisia:  No  Handed:  Right  AIMS (if indicated):     Assets:  Desire for Improvement Housing  ADL's:  Intact  Cognition: WNL  Sleep:      Risk to Self: Is patient at risk for suicide?: Yes Risk to Others:   Prior Inpatient Therapy:  [place], [facility] few times, [place] [facility], [place] Prior Outpatient Therapy:  [facility] last time before he came here last time  Alcohol Screening: 1. How often do you have a drink containing alcohol?: 2 to 4 times a month 2. How many  drinks containing alcohol do you have on a typical day when you are drinking?: 1 or 2 3. How often do you have six or more drinks on one occasion?: Monthly Preliminary Score: 2 4. How often during the last year have you found that you were not able to stop drinking once you had started?: Less than monthly 5. How often during the last year have you failed to do what was normally expected from you becasue of drinking?: Less than monthly 6. How often during the last year have you needed a first drink in the morning to get yourself going after a heavy drinking session?: Monthly 7. How often during the last year have you had a feeling of guilt of remorse after drinking?: Less than monthly 8. How often during the last year have you been unable to remember what happened the night before because you had been drinking?: Never 9. Have you or someone else been injured as a result of your drinking?: Yes, but not in the last year 10. Has a relative or friend or a doctor or another health worker been concerned about your drinking or suggested you cut down?: Yes, during the last year Alcohol Use Disorder Identification Test Final Score (AUDIT): 15 Brief Intervention: Yes  Allergies:   Allergies  Allergen Reactions  . Wellbutrin [Bupropion] Other (See Comments)    Makes me feel like I'm have a seizure   Lab Results: No results found for this or any previous visit (from the past 8 hour(s)). Current Medications: Current Facility-Administered Medications  Medication Dose Route Frequency Provider Last Rate Last Dose  . acetaminophen (TYLENOL) tablet 650 mg  650 mg Oral Q6H PRN Benjamine Mola, FNP      . alum & mag hydroxide-simeth (MAALOX/MYLANTA) 200-200-20 MG/5ML suspension 30 mL  30 mL Oral Q4H PRN Benjamine Mola, FNP      . hydrOXYzine (ATARAX/VISTARIL) tablet 25 mg  25 mg Oral Q6H PRN Benjamine Mola, FNP   25 mg at 12/25/14 2242  . ibuprofen (ADVIL,MOTRIN) tablet 600 mg  600 mg Oral Q8H PRN Patrecia Pour, NP   600 mg at 12/25/14 1941  . loperamide (IMODIUM) capsule 2-4 mg  2-4 mg Oral PRN Benjamine Mola, FNP      . LORazepam (ATIVAN) tablet 1 mg  1 mg Oral Q6H PRN Benjamine Mola, FNP   1 mg at 12/25/14 1411  . multivitamin with minerals tablet 1 tablet  1 tablet Oral Daily Benjamine Mola, FNP   1 tablet at 12/26/14 0805  . nicotine (NICODERM CQ - dosed in mg/24 hours) patch 21 mg  21  mg Transdermal Daily Patrecia Pour, NP   21 mg at 12/26/14 0805  . ondansetron (ZOFRAN-ODT) disintegrating tablet 4 mg  4 mg Oral Q6H PRN Benjamine Mola, FNP   4 mg at 12/25/14 1941  . thiamine (VITAMIN B-1) tablet 100 mg  100 mg Oral Daily Patrecia Pour, NP   100 mg at 12/26/14 1962   Or  . thiamine (B-1) injection 100 mg  100 mg Intravenous Daily Patrecia Pour, NP      . traZODone (DESYREL) tablet 50 mg  50 mg Oral QHS,MR X 1 Laverle Hobby, PA-C   50 mg at 12/26/14 0207   PTA Medications: Prescriptions prior to admission  Medication Sig Dispense Refill Last Dose  . gabapentin (NEURONTIN) 400 MG capsule Take 1 capsule (400 mg total) by mouth 3 (three) times daily. Take one capsules three times a day and once at bedtime. (Patient taking differently: Take 400 mg by mouth 4 (four) times daily - after meals and at bedtime. Take one capsules three times a day and once at bedtime.) 120 capsule 0 12/23/2014 at Unknown time  . hydrOXYzine (ATARAX/VISTARIL) 25 MG tablet Take 1 tablet (25 mg total) by mouth every 6 (six) hours as needed for anxiety. 30 tablet 0 Past Week at Unknown time  . naproxen sodium (ANAPROX) 220 MG tablet Take 220 mg by mouth 2 (two) times daily as needed (pain).   Past Week at Unknown time  . sertraline (ZOLOFT) 50 MG tablet Take 1 tablet (50 mg total) by mouth daily. 30 tablet 0 12/23/2014 at Unknown time  . ziprasidone (GEODON) 80 MG capsule Take 1 capsule (80 mg total) by mouth 2 (two) times daily with a meal. 60 capsule 0 12/23/2014 at Unknown time    Previous Psychotropic Medications: Yes  Geodon 80 mg BID, Zoloft 50 mg daily, Gabapentin 400 mg QID Vistaril Trazodone, past history of taking Seroquel Tegretol   Substance Abuse History in the last 12 months:  Yes.      Consequences of Substance Abuse: Legal Consequences:  DWI x 2 Blackouts:   DT's: Withdrawal Symptoms:   Diaphoresis Diarrhea Headaches Nausea Tremors  Results for orders placed or performed during the hospital encounter of 12/24/14 (from the past 72 hour(s))  CBC WITH DIFFERENTIAL     Status: Abnormal   Collection Time: 12/24/14  5:41 AM  Result Value Ref Range   WBC 5.1 4.0 - 10.5 K/uL   RBC 4.67 4.22 - 5.81 MIL/uL   Hemoglobin 16.0 13.0 - 17.0 g/dL   HCT 46.0 39.0 - 52.0 %   MCV 98.5 78.0 - 100.0 fL   MCH 34.3 (H) 26.0 - 34.0 pg   MCHC 34.8 30.0 - 36.0 g/dL   RDW 15.6 (H) 11.5 - 15.5 %   Platelets 308 150 - 400 K/uL   Neutrophils Relative % 41 (L) 43 - 77 %   Neutro Abs 2.1 1.7 - 7.7 K/uL   Lymphocytes Relative 44 12 - 46 %   Lymphs Abs 2.2 0.7 - 4.0 K/uL   Monocytes Relative 14 (H) 3 - 12 %   Monocytes Absolute 0.7 0.1 - 1.0 K/uL   Eosinophils Relative 0 0 - 5 %   Eosinophils Absolute 0.0 0.0 - 0.7 K/uL   Basophils Relative 1 0 - 1 %   Basophils Absolute 0.1 0.0 - 0.1 K/uL  Comprehensive metabolic panel     Status: Abnormal   Collection Time: 12/24/14  5:41 AM  Result Value Ref  Range   Sodium 140 135 - 145 mmol/L   Potassium 4.0 3.5 - 5.1 mmol/L   Chloride 95 (L) 101 - 111 mmol/L   CO2 26 22 - 32 mmol/L   Glucose, Bld 82 70 - 99 mg/dL   BUN 9 6 - 20 mg/dL   Creatinine, Ser 0.76 0.61 - 1.24 mg/dL   Calcium 9.5 8.9 - 10.3 mg/dL   Total Protein 8.6 (H) 6.5 - 8.1 g/dL   Albumin 5.2 (H) 3.5 - 5.0 g/dL   AST 56 (H) 15 - 41 U/L   ALT 38 17 - 63 U/L   Alkaline Phosphatase 101 38 - 126 U/L   Total Bilirubin 0.9 0.3 - 1.2 mg/dL   GFR calc non Af Amer >60 >60 mL/min   GFR calc Af Amer >60 >60 mL/min    Comment: (NOTE) The eGFR has been calculated using the CKD EPI equation. This  calculation has not been validated in all clinical situations. eGFR's persistently <60 mL/min signify possible Chronic Kidney Disease.    Anion gap 19 (H) 5 - 15  Lipase, blood     Status: None   Collection Time: 12/24/14  5:41 AM  Result Value Ref Range   Lipase 28 22 - 51 U/L  Ethanol     Status: Abnormal   Collection Time: 12/24/14  5:47 AM  Result Value Ref Range   Alcohol, Ethyl (B) 281 (H) <5 mg/dL    Comment:        LOWEST DETECTABLE LIMIT FOR SERUM ALCOHOL IS 11 mg/dL FOR MEDICAL PURPOSES ONLY   Acetaminophen level     Status: Abnormal   Collection Time: 12/24/14  5:47 AM  Result Value Ref Range   Acetaminophen (Tylenol), Serum <10 (L) 10 - 30 ug/mL    Comment:        THERAPEUTIC CONCENTRATIONS VARY SIGNIFICANTLY. A RANGE OF 10-30 ug/mL MAY BE AN EFFECTIVE CONCENTRATION FOR MANY PATIENTS. HOWEVER, SOME ARE BEST TREATED AT CONCENTRATIONS OUTSIDE THIS RANGE. ACETAMINOPHEN CONCENTRATIONS >150 ug/mL AT 4 HOURS AFTER INGESTION AND >50 ug/mL AT 12 HOURS AFTER INGESTION ARE OFTEN ASSOCIATED WITH TOXIC REACTIONS.   Salicylate level     Status: None   Collection Time: 12/24/14  5:47 AM  Result Value Ref Range   Salicylate Lvl <0.2 2.8 - 30.0 mg/dL  Drug screen panel, emergency     Status: Abnormal   Collection Time: 12/24/14  6:43 AM  Result Value Ref Range   Opiates NONE DETECTED NONE DETECTED   Cocaine NONE DETECTED NONE DETECTED   Benzodiazepines NONE DETECTED NONE DETECTED   Amphetamines NONE DETECTED NONE DETECTED   Tetrahydrocannabinol POSITIVE (A) NONE DETECTED   Barbiturates NONE DETECTED NONE DETECTED    Comment:        DRUG SCREEN FOR MEDICAL PURPOSES ONLY.  IF CONFIRMATION IS NEEDED FOR ANY PURPOSE, NOTIFY LAB WITHIN 5 DAYS.        LOWEST DETECTABLE LIMITS FOR URINE DRUG SCREEN Drug Class       Cutoff (ng/mL) Amphetamine      1000 Barbiturate      200 Benzodiazepine   725 Tricyclics       366 Opiates          300 Cocaine          300 THC               50   Urinalysis, Routine w reflex microscopic     Status: Abnormal   Collection Time: 12/24/14  6:43 AM  Result Value Ref Range   Color, Urine YELLOW YELLOW   APPearance CLEAR CLEAR   Specific Gravity, Urine 1.012 1.005 - 1.030   pH 5.5 5.0 - 8.0   Glucose, UA NEGATIVE NEGATIVE mg/dL   Hgb urine dipstick NEGATIVE NEGATIVE   Bilirubin Urine NEGATIVE NEGATIVE   Ketones, ur 40 (A) NEGATIVE mg/dL   Protein, ur 100 (A) NEGATIVE mg/dL   Urobilinogen, UA 1.0 0.0 - 1.0 mg/dL   Nitrite NEGATIVE NEGATIVE   Leukocytes, UA NEGATIVE NEGATIVE  Urine microscopic-add on     Status: None   Collection Time: 12/24/14  6:43 AM  Result Value Ref Range   Squamous Epithelial / LPF RARE RARE   WBC, UA 0-2 <3 WBC/hpf   Urine-Other MUCOUS PRESENT     Observation Level/Precautions:  15 minute checks  Laboratory:  As per the ED  Psychotherapy:  Individual/group  Medications:  Ativan detox/ pursue his psychotropics  Consultations:    Discharge Concerns:    Estimated LOS: 3-5 days  Other:     Psychological Evaluations: No   Treatment Plan Summary: Daily contact with patient to assess and evaluate symptoms and progress in treatment and Medication management Supportive approach/coping skills Alcohol dependence; detox with Librium Paul Dykes a relapse prevention plan Psychosis; resume the Geodon 80 mg BID Depression; resume the Zoloft 50 mg daily Insomnia; will use Ambien 10 mg HS PRN sleep Back/neck pain; resume the Neurontin 400 mg QID Will reassess for a residential treatment program ( he states his mother is coming to pick him up, take him to [place] where she and hid daughter live Medical Decision Making:  Review of Psycho-Social Stressors (1), Review or order clinical lab tests (1), Review of Medication Regimen & Side Effects (2) and Review of New Medication or Change in Dosage (2)  I certify that inpatient services furnished can reasonably be expected to improve the patient's condition.    Ellon Marasco A 5/10/201610:04 AM

## 2014-12-26 NOTE — Progress Notes (Signed)
D: Patient's affect appropriate to situation and mood is depressed, but pleasant. He reported on the self inventory sheet that his sleep, appetite and ability to concentrate are poor and energy level is low. Patient rates depression/anxiety "10" and feelings of hopelessness "9". He verbalized that it wasn't his choice to come here to the hospital, but he would like to at least get back on his psych medications. Patient is attending groups and visible in the milieu. Compliant with medications and tolerating them well.  A: Support and encouragement provided to patient. Scheduled medications given per MD orders. Maintain Q15 minute checks for safety.  R: Patient receptive. Endorses SI, but contracts for safety. Denies HI and AVH. Patient remains safe on the hall.

## 2014-12-27 NOTE — Plan of Care (Signed)
Problem: Alteration in mood & ability to function due to Goal: STG-Patient will report withdrawal symptoms Outcome: Progressing Client reports symptoms of anxiety with withdrawals as evidenced by CIWA of 4.

## 2014-12-27 NOTE — Progress Notes (Signed)
Recreation Therapy Notes  Date: 12/27/14 Time: 9:30am Location: [address] Group Room  Group Topic: Stress Management  Goal Area(s) Addresses:  Patient will actively participate in stress management techniques presented during session.   Behavioral Response: Appropriate, attentive  Intervention: Stress management techniques  Activity:  Progressive Muscle Relaxation. LRT provided instruction and demonstration on practice of Progressive Muscle Relaxation.   Education:  Stress Management, Discharge Planning.   Education Outcome: Acknowledges education  Clinical Observations/Feedback: Patient actively participated and was able to demonstrate knowledge of technique to use post discharge.     [name] Haff, LRT/CTRS         Virgilio Broadhead A 12/27/2014 3:08 PM 

## 2014-12-27 NOTE — Progress Notes (Signed)
[facility] MD Progress Note  12/27/2014 6:05 PM [name]  MRN:  [id number] Subjective:  [name] endorses that although still depressed he is starting to feel better. He recognizes that it was a bad call to get together with a patient he met here. Apparently she was still with her father living at his place. He claims he spoke with them today and that they are gone already. His mother will be coming down from [place] to visit. He has the impression she is coming to take him back with her. He is unsure of what would be the best course of action  Principal Problem: Alcohol withdrawal Diagnosis:   Patient Active Problem List   Diagnosis Date Noted  . Schizoaffective disorder [F25.9] 12/29/2013    Priority: High  . Alcohol withdrawal [F10.239] 12/25/2014  . Schizoaffective disorder, depressive type [F25.1]   . Alcohol use disorder, severe, dependence [F10.20] 11/29/2014  . Current smoker [Z72.0] 11/20/2014  . Tremors [G25.2] 11/03/2014  . Chronic posttraumatic stress disorder [F43.12]   . Protein-calorie malnutrition, severe [E43] 10/19/2014  . Hallucinations, visual [R44.1]   . Noncompliance with medication regimen [Z91.14]   . Hallucinations [R44.3] 07/07/2014  . Paranoia [F22] 07/07/2014  . Cannabis abuse [F12.10] 12/30/2013  . Post traumatic stress disorder (PTSD) [F43.10] 11/03/2011   Total Time spent with patient: 30 minutes   Past Medical History:  Past Medical History  Diagnosis Date  . Psychosis   . PTSD (post-traumatic stress disorder)   . Seizure disorder     related to etoh seizure  . Alcohol abuse   . Schizoaffective disorder   . Anxiety     at age 74  . Depression     at age 32    Past Surgical History  Procedure Laterality Date  . Foot surgery      right  . Hand surgery     Family History:  Family History  Problem Relation Age of Onset  . Alcoholism Mother   . CAD Father   . Depression Brother    Social History:  History  Alcohol Use  . Yes     Comment: "I'm a binge drinker.[name] 4-40 ounces/day"     History  Drug Use  . 1.00 per week  . Special: Marijuana    Comment: THC 2 to 3 times per month    History   Social History  . Marital Status: Divorced    Spouse Name: N/A  . Number of Children: N/A  . Years of Education: N/A   Social History Main Topics  . Smoking status: Current Every Day Smoker -- 1.50 packs/day for 24 years    Types: Cigarettes  . Smokeless tobacco: Never Used  . Alcohol Use: Yes     Comment: "I'm a binge drinker.[name] 4-40 ounces/day"  . Drug Use: 1.00 per week    Special: Marijuana     Comment: THC 2 to 3 times per month  . Sexual Activity: Yes   Other Topics Concern  . None   Social History Narrative   Additional History:    Sleep: Fair  Appetite:  Fair   Assessment:   Musculoskeletal: Strength & Muscle Tone: within normal limits Gait & Station: normal Patient leans: N/A   Psychiatric Specialty Exam: Physical Exam  Review of Systems  Constitutional: Positive for malaise/fatigue.  HENT: Negative.   Eyes: Negative.   Respiratory: Negative.   Cardiovascular: Negative.   Gastrointestinal: Negative.   Genitourinary: Negative.   Musculoskeletal: Negative.   Skin: Negative.  Neurological: Positive for weakness.  Endo/Heme/Allergies: Negative.   Psychiatric/Behavioral: Positive for depression and substance abuse. The patient is nervous/anxious.     Blood pressure 129/85, pulse 68, temperature 97.8 F (36.6 C), temperature source Oral, resp. rate 16, height _0  (1.803 m), weight 90.719 kg (200 lb), SpO2 99 %.Body mass index is 27.91 kg/(m^2).  General Appearance: Fairly Groomed  Engineer, water::  Fair  Speech:  Clear and Coherent  Volume:  Normal  Mood:  Anxious and Depressed  Affect:  Depressed and Restricted  Thought Process:  Coherent and Goal Directed  Orientation:  Full (Time, Place, and Person)  Thought Content:  symptoms events worries concerns  Suicidal Thoughts:  No   Homicidal Thoughts:  No  Memory:  Immediate;   Fair Recent;   Fair Remote;   Fair  Judgement:  Fair  Insight:  Present and Shallow  Psychomotor Activity:  Decreased  Concentration:  Fair  Recall:  [employer] of Knowledge:Fair  Language: Fair  Akathisia:  No  Handed:  Right  AIMS (if indicated):     Assets:  Desire for Improvement Housing Social Support  ADL's:  Intact  Cognition: WNL  Sleep:        Current Medications: Current Facility-Administered Medications  Medication Dose Route Frequency Provider Last Rate Last Dose  . acetaminophen (TYLENOL) tablet 650 mg  650 mg Oral Q6H PRN Benjamine Mola, FNP      . alum & mag hydroxide-simeth (MAALOX/MYLANTA) 200-200-20 MG/5ML suspension 30 mL  30 mL Oral Q4H PRN Benjamine Mola, FNP      . gabapentin (NEURONTIN) capsule 400 mg  400 mg Oral TID Nicholaus Bloom, MD   400 mg at 12/27/14 1715  . gabapentin (NEURONTIN) capsule 400 mg  400 mg Oral QHS Nicholaus Bloom, MD   400 mg at 12/26/14 2201  . hydrOXYzine (ATARAX/VISTARIL) tablet 25 mg  25 mg Oral Q6H PRN Benjamine Mola, FNP   25 mg at 12/25/14 2242  . ibuprofen (ADVIL,MOTRIN) tablet 600 mg  600 mg Oral Q8H PRN Patrecia Pour, NP   600 mg at 12/25/14 1941  . loperamide (IMODIUM) capsule 2-4 mg  2-4 mg Oral PRN Benjamine Mola, FNP   4 mg at 12/26/14 1100  . LORazepam (ATIVAN) tablet 1 mg  1 mg Oral Q6H PRN Benjamine Mola, FNP   1 mg at 12/25/14 1411  . LORazepam (ATIVAN) tablet 1 mg  1 mg Oral TID Nicholaus Bloom, MD   1 mg at 12/27/14 1715   Followed by  . [START ON 12/28/2014] LORazepam (ATIVAN) tablet 1 mg  1 mg Oral BID Nicholaus Bloom, MD       Followed by  . [START ON 12/30/2014] LORazepam (ATIVAN) tablet 1 mg  1 mg Oral Daily Nicholaus Bloom, MD      . multivitamin with minerals tablet 1 tablet  1 tablet Oral Daily Benjamine Mola, FNP   1 tablet at 12/27/14 0845  . nicotine (NICODERM CQ - dosed in mg/24 hours) patch 21 mg  21 mg Transdermal Daily Patrecia Pour, NP   21 mg at 12/27/14  0800  . ondansetron (ZOFRAN-ODT) disintegrating tablet 4 mg  4 mg Oral Q6H PRN Benjamine Mola, FNP   4 mg at 12/25/14 1941  . sertraline (ZOLOFT) tablet 50 mg  50 mg Oral Daily Nicholaus Bloom, MD   50 mg at 12/27/14 0841  . thiamine (VITAMIN B-1) tablet 100 mg  100 mg Oral Daily Patrecia Pour, NP   100 mg at 12/27/14 0845   Or  . thiamine (B-1) injection 100 mg  100 mg Intravenous Daily Patrecia Pour, NP      . traZODone (DESYREL) tablet 50 mg  50 mg Oral QHS,MR X 1 Laverle Hobby, PA-C   50 mg at 12/26/14 0207  . ziprasidone (GEODON) capsule 80 mg  80 mg Oral BID WC Nicholaus Bloom, MD   80 mg at 12/27/14 1715  . zolpidem (AMBIEN) tablet 10 mg  10 mg Oral QHS PRN Nicholaus Bloom, MD   10 mg at 12/26/14 2204    Lab Results: No results found for this or any previous visit (from the past 24 hour(s)).  Physical Findings: AIMS: Facial and Oral Movements Muscles of Facial Expression: None, normal Lips and Perioral Area: None, normal Jaw: None, normal Tongue: None, normal,Extremity Movements Upper (arms, wrists, hands, fingers): None, normal Lower (legs, knees, ankles, toes): None, normal, Trunk Movements Neck, shoulders, hips: None, normal, Overall Severity Severity of abnormal movements (highest score from questions above): None, normal Incapacitation due to abnormal movements: None, normal Patient's awareness of abnormal movements (rate only patient's report): No Awareness, Dental Status Current problems with teeth and/or dentures?: No Does patient usually wear dentures?: No  CIWA:  CIWA-Ar Total: 5 COWS:     Treatment Plan Summary: Daily contact with patient to assess and evaluate symptoms and progress in treatment and Medication management Supportive approach/coping skills Alcohol dependence; will continue the detox protocol and work a relapse prevention plan Depression; will continue the Zoloft and consider increasing the dose Hallucinations; will continue the Geodon at 80 mg  BID Insomnia; will continue the Ambien 10 mg HS PRN sleep   Medical Decision Making:  Review of Psycho-Social Stressors (1), Review of Medication Regimen & Side Effects (2) and Review of New Medication or Change in Dosage (2)     Jet Traynham A 12/27/2014, 6:05 PM

## 2014-12-27 NOTE — BHH Group Notes (Signed)
   [facility] LCSW Aftercare Discharge Planning Group Note  12/27/2014  8:45 AM   Participation Quality: Alert, Appropriate and Oriented  Mood/Affect: Depressed and Flat  Depression Rating: 6  Anxiety Rating: 8  Thoughts of Suicide: Pt denies SI/HI  Will you contract for safety? Yes  Current AVH: Pt denies  Plan for Discharge/Comments: Pt attended discharge planning group and actively participated in group. CSW provided pt with today's workbook. Patient reports feeling "better" today due to medications working well and sleeping well. Patient plans to return home to continue services at [facility].   Transportation Means: Pt reports access to transportation  Supports: Mother has been identified as a support.  Tilden Fossa, MSW, [place] Worker [facility] [phone]

## 2014-12-27 NOTE — Progress Notes (Signed)
Patient ID: [name], male   DOB: 08-22-1970, 44 y.o.   MRN: [id number]  DAR: Pt. Denies SI/HI and A/V Hallucinations to Probation officer. Patient does not report any pain or discomfort at this time. Support and encouragement provided to the patient. Patient was encouraged to drink plenty of fluids to stay hydrated during detox. Patient reports withdrawal symptoms including anxiety, sweating, and tremors. Scheduled medications administered to patient per physician's orders. Patient is receptive and cooperative with Probation officer but minimal. Q15 minute checks are maintained for safety.

## 2014-12-27 NOTE — BHH Group Notes (Signed)
[place] LCSW Group Therapy 12/27/2014  1:15 PM   Type of Therapy: Group Therapy  Participation Level: Did Not Attend. Patient invited to attend but declined.   Tilden Fossa, MSW, [place] Worker [facility] [phone]

## 2014-12-27 NOTE — Progress Notes (Signed)
Patient ID: [name], male   DOB: [birth year], 44 y.o.   MRN: [id number]  Picked up patient after Vergia Alcon. RN left, no report was given to this Probation officer. Will establish contact with patient and assume care.

## 2014-12-27 NOTE — Plan of Care (Signed)
Problem: Alteration in thought process Goal: LTG-Patient behavior demonstrates decreased signs psychosis (Patient behavior demonstrates decreased signs of psychosis to the point the patient is safe to return home and continue treatment in an outpatient setting.)  Outcome: Progressing Client reports AH, by verbalizing "I hear voices, but they don't tell me to do anything to hurt myself"

## 2014-12-28 NOTE — Progress Notes (Signed)
Patient did attend the evening karaoke group. Pt was engaged and supportive but did not participate by singing a song.    

## 2014-12-28 NOTE — Progress Notes (Signed)
[name] has had a quiet day. HE is attending his groups and is interacting appropriately on the unit. He is flat, depressed and quiet..he completes his daily assessment and on it he writes he denies SI today and he rates his depression, hopelessness and anxiety " 2/2/8", respectively.   A he avoids eye contact and looks down as he walks around the halls. He says " I guess youre surprised to see me back . He talks about feeling guilty about his relapse and how hard he feels his life is.   R POC cont.

## 2014-12-28 NOTE — BHH Group Notes (Signed)
[place] LCSW Group Therapy 12/28/2014 1:15 PM Type of Therapy: Group Therapy Participation Level: Active  Participation Quality: Attentive, Sharing and Supportive  Affect: Appropriate  Cognitive: Alert and Oriented  Insight: Developing/Improving and Engaged  Engagement in Therapy: Developing/Improving and Engaged  Modes of Intervention: Activity, Clarification, Confrontation, Discussion, Education, Exploration, Limit-setting, Orientation, Problem-solving, Rapport Building, Art therapist, Socialization and Support  Summary of Progress/Problems: Patient was attentive and engaged with speaker from [place]. Patient was attentive to speaker while they shared their story of dealing with mental health and overcoming it. Patient expressed interest in their programs and services and received information on their agency. Patient processed ways they can relate to the speaker.   Tilden Fossa, MSW, [place] Worker [facility] [phone]

## 2014-12-28 NOTE — Progress Notes (Signed)
D: Pt has anxious affect and mood.  Pt reports his goal was "to make it to every group, but I missed one."  Pt denies SI/HI, denies pain.  Pt reports auditory hallucinations, stating he is "hearing voices" and that they are "mostly laughing at me."  Pt denies visual hallucinations.  Pt has been visible in milieu. Pt attended evening group.   A: Introduced self to pt.  Met with pt 1:1 and provided support and encouragement.  PRN medication administered for sleep. R: Pt refused scheduled Trazodone, stating "I take Ambien."  Pt verbally contracts for safety.  Will continue to monitor and assess.

## 2014-12-28 NOTE — Progress Notes (Signed)
[facility] MD Progress Note  12/28/2014 7:26 PM [name]  MRN:  [id number] Subjective:  [name] states he is starting to feel better. He still does not have it clear when is his mother coming down to see him. He still would like to go back with her. He states that the male that he got to drink with when he left last time is gone from his house. When he gets back home he plans to go back to AA, and also go to the [facility] support groups Principal Problem: Alcohol withdrawal Diagnosis:   Patient Active Problem List   Diagnosis Date Noted  . Schizoaffective disorder [F25.9] 12/29/2013    Priority: High  . Alcohol withdrawal [F10.239] 12/25/2014  . Schizoaffective disorder, depressive type [F25.1]   . Alcohol use disorder, severe, dependence [F10.20] 11/29/2014  . Current smoker [Z72.0] 11/20/2014  . Tremors [G25.2] 11/03/2014  . Chronic posttraumatic stress disorder [F43.12]   . Protein-calorie malnutrition, severe [E43] 10/19/2014  . Hallucinations, visual [R44.1]   . Noncompliance with medication regimen [Z91.14]   . Hallucinations [R44.3] 07/07/2014  . Paranoia [F22] 07/07/2014  . Cannabis abuse [F12.10] 12/30/2013  . Post traumatic stress disorder (PTSD) [F43.10] 11/03/2011   Total Time spent with patient: 30 minutes   Past Medical History:  Past Medical History  Diagnosis Date  . Psychosis   . PTSD (post-traumatic stress disorder)   . Seizure disorder     related to etoh seizure  . Alcohol abuse   . Schizoaffective disorder   . Anxiety     at age 53  . Depression     at age 43    Past Surgical History  Procedure Laterality Date  . Foot surgery      right  . Hand surgery     Family History:  Family History  Problem Relation Age of Onset  . Alcoholism Mother   . CAD Father   . Depression Brother    Social History:  History  Alcohol Use  . Yes    Comment: "I'm a binge drinker.[name] 4-40 ounces/day"     History  Drug Use  . 1.00 per week  .  Special: Marijuana    Comment: THC 2 to 3 times per month    History   Social History  . Marital Status: Divorced    Spouse Name: N/A  . Number of Children: N/A  . Years of Education: N/A   Social History Main Topics  . Smoking status: Current Every Day Smoker -- 1.50 packs/day for 24 years    Types: Cigarettes  . Smokeless tobacco: Never Used  . Alcohol Use: Yes     Comment: "I'm a binge drinker.[name] 4-40 ounces/day"  . Drug Use: 1.00 per week    Special: Marijuana     Comment: THC 2 to 3 times per month  . Sexual Activity: Yes   Other Topics Concern  . None   Social History Narrative   Additional History:    Sleep: Fair  Appetite:  Fair   Assessment:   Musculoskeletal: Strength & Muscle Tone: within normal limits Gait & Station: normal Patient leans: N/A   Psychiatric Specialty Exam: Physical Exam  Review of Systems  Constitutional: Negative.   HENT: Negative.   Eyes: Negative.   Respiratory: Negative.   Cardiovascular: Negative.   Gastrointestinal: Negative.   Genitourinary: Negative.   Musculoskeletal: Negative.   Skin: Negative.   Neurological: Negative.   Endo/Heme/Allergies: Negative.   Psychiatric/Behavioral: Positive for depression and  substance abuse. The patient is nervous/anxious.     Blood pressure 135/96, pulse 96, temperature 98.2 F (36.8 C), temperature source Oral, resp. rate 16, height 5\' 11"  (1.803 m), weight 90.719 kg (200 lb), SpO2 100 %.Body mass index is 27.91 kg/(m^2).  General Appearance: Fairly Groomed  Engineer, water::  Fair  Speech:  Clear and Coherent, Slow and not spontaneous  Volume:  Decreased  Mood:  Anxious, Depressed and still worried  Affect:  Restricted  Thought Process:  Coherent and Goal Directed  Orientation:  Full (Time, Place, and Person)  Thought Content:  symptoms events worries concerns still shame and guilt for having relapse  Suicidal Thoughts:  No  Homicidal Thoughts:  No  Memory:  Immediate;    Fair Recent;   Fair Remote;   Fair  Judgement:  Fair  Insight:  Present and Shallow  Psychomotor Activity:  Restlessness  Concentration:  Fair  Recall:  [employer] of Knowledge:Fair  Language: Fair  Akathisia:  No  Handed:  Right  AIMS (if indicated):     Assets:  Desire for Improvement Housing Social Support  ADL's:  Intact  Cognition: WNL  Sleep:        Current Medications: Current Facility-Administered Medications  Medication Dose Route Frequency Provider Last Rate Last Dose  . acetaminophen (TYLENOL) tablet 650 mg  650 mg Oral Q6H PRN Benjamine Mola, FNP      . alum & mag hydroxide-simeth (MAALOX/MYLANTA) 200-200-20 MG/5ML suspension 30 mL  30 mL Oral Q4H PRN Benjamine Mola, FNP      . gabapentin (NEURONTIN) capsule 400 mg  400 mg Oral TID Nicholaus Bloom, MD   400 mg at 12/28/14 1644  . gabapentin (NEURONTIN) capsule 400 mg  400 mg Oral QHS Nicholaus Bloom, MD   400 mg at 12/27/14 2106  . ibuprofen (ADVIL,MOTRIN) tablet 600 mg  600 mg Oral Q8H PRN Patrecia Pour, NP   600 mg at 12/25/14 1941  . LORazepam (ATIVAN) tablet 1 mg  1 mg Oral BID Nicholaus Bloom, MD   1 mg at 12/28/14 1644   Followed by  . [START ON 12/30/2014] LORazepam (ATIVAN) tablet 1 mg  1 mg Oral Daily Nicholaus Bloom, MD      . multivitamin with minerals tablet 1 tablet  1 tablet Oral Daily Benjamine Mola, FNP   1 tablet at 12/28/14 [phone]  . nicotine (NICODERM CQ - dosed in mg/24 hours) patch 21 mg  21 mg Transdermal Daily Patrecia Pour, NP   21 mg at 12/28/14 0800  . sertraline (ZOLOFT) tablet 50 mg  50 mg Oral Daily Nicholaus Bloom, MD   50 mg at 12/28/14 1884  . thiamine (VITAMIN B-1) tablet 100 mg  100 mg Oral Daily Patrecia Pour, NP   100 mg at 12/28/14 1660   Or  . thiamine (B-1) injection 100 mg  100 mg Intravenous Daily Patrecia Pour, NP      . traZODone (DESYREL) tablet 50 mg  50 mg Oral QHS,MR X 1 Laverle Hobby, PA-C   50 mg at 12/26/14 0207  . ziprasidone (GEODON) capsule 80 mg  80 mg Oral BID WC  Nicholaus Bloom, MD   80 mg at 12/28/14 1644  . zolpidem (AMBIEN) tablet 10 mg  10 mg Oral QHS PRN Nicholaus Bloom, MD   10 mg at 12/27/14 2105    Lab Results: No results found for this or any previous visit (  from the past 48 hour(s)).  Physical Findings: AIMS: Facial and Oral Movements Muscles of Facial Expression: None, normal Lips and Perioral Area: None, normal Jaw: None, normal Tongue: None, normal,Extremity Movements Upper (arms, wrists, hands, fingers): None, normal Lower (legs, knees, ankles, toes): None, normal, Trunk Movements Neck, shoulders, hips: None, normal, Overall Severity Severity of abnormal movements (highest score from questions above): None, normal Incapacitation due to abnormal movements: None, normal Patient's awareness of abnormal movements (rate only patient's report): No Awareness, Dental Status Current problems with teeth and/or dentures?: No Does patient usually wear dentures?: No  CIWA:  CIWA-Ar Total: 2 COWS:     Treatment Plan Summary: Daily contact with patient to assess and evaluate symptoms and progress in treatment and Medication management Supportive approach/coping skills Alcohol dependence; complete the detox/work a relapse prevention plan Reassess for the presence of alcohol cravings, consider adding Campral Depression; continue the Zoloft Psychosis; continue the Geodon Insomnia; continue the Ambien Will reassess for the need to increase the Zoloft. When he was admitted he blamed the depression for relapsing   Medical Decision Making:  Review of Psycho-Social Stressors (1), Review of Medication Regimen & Side Effects (2) and Review of New Medication or Change in Dosage (2)     Lempi Edwin A 12/28/2014, 7:26 PM

## 2014-12-28 NOTE — Plan of Care (Signed)
Problem: Alteration in mood & ability to function due to Goal: LTG-Pt reports reduction in suicidal thoughts (Patient reports reduction in suicidal thoughts and is able to verbalize a safety plan for whenever patient is feeling suicidal)  Outcome: Progressing Pt denied SI this shift and verbally contracted for safety.    Problem: Diagnosis: Increased Risk For Suicide Attempt Goal: STG-Patient Will Attend All Groups On The Unit Outcome: Progressing Pt attended evening group on 12/27/14.

## 2014-12-28 NOTE — Progress Notes (Signed)
Patient ID: [name], male   DOB: 07/25/1971, 44 y.o.   MRN: [id number] D: Client is visible on the unit, reports depression as "2" of 10. Client notes anxiety at "7" of 10, but that's normal for me. Client brightens on approach. [place]. A:Writer encouraged client to verbalize needs. Staff will monitor q32min for safety. R: client is safe on the unit, attended karaoke.

## 2014-12-29 NOTE — Progress Notes (Signed)
D: Patient is alert and oriented. Pt's mood and affect is anxious and blunted. Pt paces the halls at times. Pt denies SI/HI and VH. Pt reports AH of "voices" but reports they have decreased d/t medication and pt states the voices are "manageable." Pt rates depression and hopelessnes 2/10, and anxiety 7/10. Pt reports his goal for the day is "finalize discharge plan" by "talking with dr. and Education officer, museum." Pt complains of left hand pain d/t "arthritis" this morning, 2/10 with relief from PRN medication. Pt is attending unit groups today. A: Active listening by RN. Encouragement/Support provided to pt. Medication education reviewed with pt. PRN medication administered for pain per providers orders (See MAR). Scheduled medications administered per providers orders (See MAR). 15 minute checks continued per protocol for patient safety.  R: Patient cooperative and receptive to nursing interventions. Pt remains safe.

## 2014-12-29 NOTE — Progress Notes (Signed)
Patient ID: [name], male   DOB: 1971/02/16, 44 y.o.   MRN: [id number] PER STATE REGULATIONS 482.30  THIS CHART WAS REVIEWED FOR MEDICAL NECESSITY WITH RESPECT TO THE PATIENT'S ADMISSION/ DURATION OF STAY.  NEXT REVIEW DATE: 01/02/2015  Chauncy Lean, RN, BSN CASE MANAGER

## 2014-12-29 NOTE — BHH Group Notes (Signed)
[place] LCSW Group Therapy 12/29/2014 1:15 PM Type of Therapy: Group Therapy Participation Level: Active  Participation Quality: Attentive, Sharing and Supportive  Affect: Appropriate  Cognitive: Alert and Oriented  Insight: Developing/Improving and Engaged  Engagement in Therapy: Developing/Improving and Engaged  Modes of Intervention: Clarification, Confrontation, Discussion, Education, Exploration, Limit-setting, Orientation, Problem-solving, Rapport Building, Art therapist, Socialization and Support  Summary of Progress/Problems: The topic for today was feelings about relapse. Pt discussed what relapse prevention is to them and identified triggers that they are on the path to relapse. Pt processed their feeling towards relapse and was able to relate to peers. Pt discussed coping skills that can be used for relapse prevention. Patient identified a recent unhealthy relationship as his trigger for relapse on alcohol. Patient shared that he has a history of treatment non-compliance and that he realizes that he could die if he does not take his recovery seriously. CSW and other group members provided patient with emotional support and recovery.   Tilden Fossa, MSW, [place] Worker [facility] [phone]

## 2014-12-29 NOTE — Plan of Care (Signed)
Problem: Ineffective individual coping Goal: STG: Patient will remain free from self harm Outcome: Progressing Patient remains free from self harm. 15 minute checks continued per protocol for patient safety.   Problem: Alteration in mood & ability to function due to Goal: LTG-Pt reports reduction in suicidal thoughts (Patient reports reduction in suicidal thoughts and is able to verbalize a safety plan for whenever patient is feeling suicidal)  Outcome: Progressing Patient denies having suicidal thoughts today. Goal: STG-Patient will comply with prescribed medication regimen (Patient will comply with prescribed medication regimen)  Outcome: Progressing Patient has adhered to medication regiment today with ease.  Problem: Diagnosis: Increased Risk For Suicide Attempt Goal: STG-Patient Will Attend All Groups On The Unit Outcome: Progressing Patient is attending unit groups today.

## 2014-12-29 NOTE — Tx Team (Signed)
Interdisciplinary Treatment Plan Update (Adult) Date: 12/29/2014   Time Reviewed: 9:30 AM  Progress in Treatment: Attending groups: Yes Participating in groups: Yes Taking medication as prescribed: Yes Tolerating medication: Yes Family/Significant other contact made: Yes, CSW has spoken with patient's mother Patient understands diagnosis: Yes Discussing patient identified problems/goals with staff: Yes Medical problems stabilized or resolved: Yes Denies suicidal/homicidal ideation: Yes, denies Issues/concerns per patient self-inventory: Yes Other:  New problem(s) identified: N/A  Discharge Plan or Barriers:   5/10: CSW continuing to assess. Patient with multiple previous admissions, history of non-compliance with treatment and medication compliance. Lives alone and has limited support. Has received services from [facility] in the past.  5/13: Patient plans to return home to follow up with [facility] for outpatient services. He plans to move to [facility] in the near future to be closer with his family. Mother is planning on coming to visit him next week.  Reason for Continuation of Hospitalization:  Depression Anxiety Medication Stabilization   Comments: N/A  Estimated length of stay: Discharge anticipated for tomorrow 5/14.  For review of initial/current patient goals, please see plan of care.  Patient is a 44 year old Male admitted for SI, ETOH detox, and schizoaffective disorder with AH. Patient lives in [place] alone. Patient will benefit from crisis stabilization, medication evaluation, group therapy, and psycho education in addition to case management for discharge planning. Patient and CSW reviewed pt's identified goals and treatment plan. Pt verbalized understanding and agreed to treatment plan.   Attendees: Patient:    Family:    Physician: Dr. Sabra Heck 12/29/2014 9:30 AM  Nursing: Doretha Sou RN 12/29/2014 9:30 AM  Clinical Social Worker:  Erasmo Downer Audrinna Sherman,  [place] 12/29/2014 9:30 AM  Other: Joette Catching, LCSW 12/29/2014 9:30 AM  Other: Maxie Better, LCSWA  12/29/2014 9:30 AM  Other: Lars Pinks, Case Manager 12/29/2014 9:30 AM  Other:  12/29/2014 9:30 AM  Other:    Other:    Other:    Other:    Other:     Scribe for Treatment Team:  Tilden Fossa, MSW, [employer] [phone]

## 2014-12-29 NOTE — BHH Group Notes (Signed)
   [facility] LCSW Aftercare Discharge Planning Group Note  12/29/2014  8:45 AM   Participation Quality: Alert, Appropriate and Oriented  Mood/Affect: Appropriate  Depression Rating: 2  Anxiety Rating: 7- reports this is better than usual  Thoughts of Suicide: Pt denies SI/HI  Will you contract for safety? Yes  Current AVH: Pt denies  Plan for Discharge/Comments: Pt attended discharge planning group and actively participated in group. CSW provided pt with today's workbook. Patient reports feeling "good" today. He is hopeful to discharge home tomorrow 5/14. He plans to follow up with [facility], [place], and AA.   Transportation Means: Pt reports access to transportation  Supports: No supports mentioned at this time  Tilden Fossa, MSW, [place] Social Worker [employer] [phone]

## 2014-12-29 NOTE — Progress Notes (Signed)
Recreation Therapy Notes  Date: 12/28/14 Time: 2:30pm Location: [address]   AAA/T Program Assumption of Risk Form signed by Patient/ or Parent Legal Guardian YES   Patient is free of allergies or sever asthma YES   Patient reports no fear of animals YES  Patient reports no history of cruelty to animals YES  Patient understands his/her participation is voluntary YES  Patient washes hands before animal contact YES  Patient washes hands after animal contact YES  Behavioral Response: Appropriate, attentive  Education:Hand Washing, Appropriate Animal Interaction   Education Outcome: Acknowledges understanding/In group clarification offered   Clinical Observations/Feedback: Patient pet the dog and was attentive to speaker.   Victorino Sparrow, LRT/CTRS          Victorino Sparrow A 12/29/2014 8:17 AM

## 2014-12-29 NOTE — Progress Notes (Signed)
  [facility] Adult Case Management Discharge Plan :  Will you be returning to the same living situation after discharge:  Yes,  Patient plans to return home At discharge, do you have transportation home?: Yes,  patient reports access to transportation Do you have the ability to pay for your medications: Yes,  patient will be provided with medication samples and prescriptions at discharge  Release of information consent forms completed and in the chart;  Patient's signature needed at discharge.  Patient to Follow up at: Follow-up Information    Follow up with [facility] On 02/05/2015.   Specialty:  Behavioral Health   Why:  Medication management appointment on Monday June 20th at 10:20am. Please go to walk-in clinic prior to appointment to be seen for a "hospital follow up medication check" and let them know you are an established patient to be seen sooner.   Contact information:   [place] [address] [phone]       Patient denies SI/HI: Yes,  denies    Safety Planning and Suicide Prevention discussed: Yes,  with patient and mother  Have you used any form of tobacco in the last 30 days? (Cigarettes, Smokeless Tobacco, Cigars, and/or Pipes): Yes  Has patient been referred to the Quitline?: Yes, faxed on 12/29/14  Woodward Klem, [name] 12/29/2014, 10:04 AM

## 2014-12-29 NOTE — Progress Notes (Signed)
Pt did not attend group. 

## 2014-12-29 NOTE — Progress Notes (Signed)
Recreation Therapy Notes  Date: 12/29/14 Time: 9:30am Location: [address] Group Room  Group Topic: Stress Management  Goal Area(s) Addresses:  Patient will actively participate in stress management techniques presented during session.   Behavioral Response: Appropriate, attentive  Intervention: Stress management techniques  Activity: Guided Imagery. LRT provided instruction and demonstration for Guided Imagery.   Education: Stress Management, Discharge Planning.   Education Outcome: Acknowledges education  Clinical Observations/Feedback: Patient actively participated in group. Patient stated that he was visualizing being in a dessert and that he felt really relaxed.    Victorino Sparrow, LRT/CTRS   Victorino Sparrow A 12/29/2014 3:31 PM

## 2014-12-29 NOTE — Progress Notes (Signed)
D.  Pt pleasant on approach, denies complaints at this time.  Pt does not take Trazodone, takes Ambien for sleep.  Positive for evening AA group, interacting appropriately with peers on the unit.  Denies SI/HI/hallucinations at this time.  A.  Support and encouragement offered  R.  Pt remains safe on unit, will continue to monitor.

## 2014-12-29 NOTE — Plan of Care (Signed)
Problem: Alteration in mood Goal: LTG-Pt's behavior demonstrates decreased signs of depression 5/11: Goal not met: Pt presents with flat affect and depressed mood. Pt with depression rating of 6 today. Pt to show decreased sign of depression and a rating of 3 or less before d/c.   Tilden Fossa, MSW, [place] Worker [facility] [phone]     (Patient's behavior demonstrates decreased signs of depression to the point the patient is safe to return home and continue treatment in an outpatient setting)  Outcome: Progressing Client has shown decreased signs of depression, brightens on approach, reports depression as 2/10.

## 2014-12-29 NOTE — Progress Notes (Signed)
[facility] MD Progress Note  12/29/2014 3:41 PM Daivd Fredericksen  MRN:  [id number] Subjective:  Gaetano continues to be detox. He is mindful of the times he has been here before and was wanting to abstain and ended up relapsing. He has worked on identifying the triggers and developing a plan of how to address them. He is going to be staying with his brother and then his mother will be coming Thursday. He states the male he relapsed with is gone to [place] and that there is no alcohol left at the house. He also states he is anxious around group of people and that that keeps him many times from going out to support groups AA etc.  Principal Problem: Alcohol withdrawal Diagnosis:   Patient Active Problem List   Diagnosis Date Noted  . Schizoaffective disorder [F25.9] 12/29/2013    Priority: High  . Alcohol withdrawal [F10.239] 12/25/2014  . Schizoaffective disorder, depressive type [F25.1]   . Alcohol use disorder, severe, dependence [F10.20] 11/29/2014  . Current smoker [Z72.0] 11/20/2014  . Tremors [G25.2] 11/03/2014  . Chronic posttraumatic stress disorder [F43.12]   . Protein-calorie malnutrition, severe [E43] 10/19/2014  . Hallucinations, visual [R44.1]   . Noncompliance with medication regimen [Z91.14]   . Hallucinations [R44.3] 07/07/2014  . Paranoia [F22] 07/07/2014  . Cannabis abuse [F12.10] 12/30/2013  . Post traumatic stress disorder (PTSD) [F43.10] 11/03/2011   Total Time spent with patient: 30 minutes   Past Medical History:  Past Medical History  Diagnosis Date  . Psychosis   . PTSD (post-traumatic stress disorder)   . Seizure disorder     related to etoh seizure  . Alcohol abuse   . Schizoaffective disorder   . Anxiety     at age 24  . Depression     at age 27    Past Surgical History  Procedure Laterality Date  . Foot surgery      right  . Hand surgery     Family History:  Family History  Problem Relation Age of Onset  . Alcoholism Mother   . CAD Father   .  Depression Brother    Social History:  History  Alcohol Use  . Yes    Comment: "I'm a binge drinker.[name] 4-40 ounces/day"     History  Drug Use  . 1.00 per week  . Special: Marijuana    Comment: THC 2 to 3 times per month    History   Social History  . Marital Status: Divorced    Spouse Name: N/A  . Number of Children: N/A  . Years of Education: N/A   Social History Main Topics  . Smoking status: Current Every Day Smoker -- 1.50 packs/day for 24 years    Types: Cigarettes  . Smokeless tobacco: Never Used  . Alcohol Use: Yes     Comment: "I'm a binge drinker.[name] 4-40 ounces/day"  . Drug Use: 1.00 per week    Special: Marijuana     Comment: THC 2 to 3 times per month  . Sexual Activity: Yes   Other Topics Concern  . None   Social History Narrative   Additional History:    Sleep: Fair  Appetite:  Fair   Assessment:   Musculoskeletal: Strength & Muscle Tone: within normal limits Gait & Station: normal Patient leans: N/A   Psychiatric Specialty Exam: Physical Exam  Review of Systems  Constitutional: Negative.   HENT: Negative.   Eyes: Negative.   Respiratory: Negative.   Cardiovascular: Negative.  Gastrointestinal: Negative.   Genitourinary: Negative.   Musculoskeletal: Negative.   Skin: Negative.   Neurological: Negative.   Endo/Heme/Allergies: Negative.   Psychiatric/Behavioral: Positive for depression and substance abuse. The patient is nervous/anxious.     Blood pressure 122/75, pulse 61, temperature 98.2 F (36.8 C), temperature source Oral, resp. rate 18, height 5\' 11"  (1.803 m), weight 90.719 kg (200 lb), SpO2 100 %.Body mass index is 27.91 kg/(m^2).  General Appearance: Fairly Groomed  Engineer, water::  Fair  Speech:  Clear and Coherent  Volume:  Decreased  Mood:  Anxious and worried  Affect:  anxious  Thought Process:  Coherent and Goal Directed  Orientation:  Full (Time, Place, and Person)  Thought Content:  sympotms events worries concerns   Suicidal Thoughts:  No  Homicidal Thoughts:  No  Memory:  Immediate;   Fair Recent;   Fair Remote;   Fair  Judgement:  Fair  Insight:  Present  Psychomotor Activity:  Restlessness  Concentration:  Fair  Recall:  [employer] of Knowledge:Fair  Language: Fair  Akathisia:  No  Handed:  Right  AIMS (if indicated):     Assets:  Desire for Improvement Housing Social Support  ADL's:  Intact  Cognition: WNL  Sleep:  Number of Hours: 4.25     Current Medications: Current Facility-Administered Medications  Medication Dose Route Frequency Provider Last Rate Last Dose  . acetaminophen (TYLENOL) tablet 650 mg  650 mg Oral Q6H PRN Benjamine Mola, FNP      . alum & mag hydroxide-simeth (MAALOX/MYLANTA) 200-200-20 MG/5ML suspension 30 mL  30 mL Oral Q4H PRN Benjamine Mola, FNP      . gabapentin (NEURONTIN) capsule 400 mg  400 mg Oral TID Nicholaus Bloom, MD   400 mg at 12/29/14 1142  . gabapentin (NEURONTIN) capsule 400 mg  400 mg Oral QHS Nicholaus Bloom, MD   400 mg at 12/28/14 2205  . ibuprofen (ADVIL,MOTRIN) tablet 600 mg  600 mg Oral Q8H PRN Patrecia Pour, NP   600 mg at 12/29/14 0825  . [START ON 12/30/2014] LORazepam (ATIVAN) tablet 1 mg  1 mg Oral Daily Nicholaus Bloom, MD      . multivitamin with minerals tablet 1 tablet  1 tablet Oral Daily Benjamine Mola, FNP   1 tablet at 12/29/14 0751  . nicotine (NICODERM CQ - dosed in mg/24 hours) patch 21 mg  21 mg Transdermal Daily Patrecia Pour, NP   21 mg at 12/29/14 0753  . sertraline (ZOLOFT) tablet 50 mg  50 mg Oral Daily Nicholaus Bloom, MD   50 mg at 12/29/14 0751  . thiamine (VITAMIN B-1) tablet 100 mg  100 mg Oral Daily Patrecia Pour, NP   100 mg at 12/29/14 4268   Or  . thiamine (B-1) injection 100 mg  100 mg Intravenous Daily Patrecia Pour, NP      . traZODone (DESYREL) tablet 50 mg  50 mg Oral QHS,MR X 1 Laverle Hobby, PA-C   50 mg at 12/26/14 0207  . ziprasidone (GEODON) capsule 80 mg  80 mg Oral BID WC Nicholaus Bloom, MD   80 mg at  12/29/14 0751  . zolpidem (AMBIEN) tablet 10 mg  10 mg Oral QHS PRN Nicholaus Bloom, MD   10 mg at 12/28/14 2207    Lab Results: No results found for this or any previous visit (from the past 62 hour(s)).  Physical Findings: AIMS: Facial and  Oral Movements Muscles of Facial Expression: None, normal Lips and Perioral Area: None, normal Jaw: None, normal Tongue: None, normal,Extremity Movements Upper (arms, wrists, hands, fingers): None, normal Lower (legs, knees, ankles, toes): None, normal, Trunk Movements Neck, shoulders, hips: None, normal, Overall Severity Severity of abnormal movements (highest score from questions above): None, normal Incapacitation due to abnormal movements: None, normal Patient's awareness of abnormal movements (rate only patient's report): No Awareness, Dental Status Current problems with teeth and/or dentures?: No Does patient usually wear dentures?: No  CIWA:  CIWA-Ar Total: 2 COWS:     Treatment Plan Summary: Daily contact with patient to assess and evaluate symptoms and progress in treatment and Medication management Supportive approach/coping skills Alcohol dependence; complete the detox protocol/work a relapse prevention plan Depression; continue the Zoloft at 50 mg daily Hallucinations ; continue the Geodon at 80 mg BID Anxiety around people' work with CBT/mindfulness, stress management Will reassess for D/C in AM Medical Decision Making:  Review of Psycho-Social Stressors (1), Review of Medication Regimen & Side Effects (2) and Review of New Medication or Change in Dosage (2)     Ramaya Guile A 12/29/2014, 3:41 PM

## 2014-12-30 DIAGNOSIS — F10230 Alcohol dependence with withdrawal, uncomplicated: Secondary | ICD-10-CM

## 2014-12-30 DIAGNOSIS — F251 Schizoaffective disorder, depressive type: Secondary | ICD-10-CM

## 2014-12-30 MED ORDER — SERTRALINE HCL 50 MG PO TABS: 50.0000 mg | ORAL_TABLET | Freq: Every day | ORAL | Status: DC

## 2014-12-30 MED ORDER — ZOLPIDEM TARTRATE 10 MG PO TABS: 10.0000 mg | ORAL_TABLET | Freq: Every evening | ORAL | Status: AC | PRN

## 2014-12-30 MED ORDER — TRAZODONE HCL 50 MG PO TABS: 50.0000 mg | ORAL_TABLET | Freq: Every evening | ORAL | Status: DC | PRN

## 2014-12-30 MED ORDER — ZIPRASIDONE HCL 80 MG PO CAPS: 80.0000 mg | ORAL_CAPSULE | Freq: Two times a day (BID) | ORAL | Status: DC

## 2014-12-30 MED ORDER — GABAPENTIN 400 MG PO CAPS: ORAL_CAPSULE | ORAL | Status: DC

## 2014-12-30 MED ORDER — NICOTINE 21 MG/24HR TD PT24: 21.0000 mg | MEDICATED_PATCH | Freq: Every day | TRANSDERMAL | Status: DC

## 2014-12-30 NOTE — BHH Group Notes (Signed)
[facility] Group Notes:  (Clinical Social Work)  12/30/2014     10-11AM  Summary of Progress/Problems: The main focus of today's process group was to learn how to use a decisional balance exercise to move forward. Motivational Interviewing was utilized to help patients explore in depth the perceived benefits and costs of unhealthy coping techniques, as well as the benefits and costs of replacing that with a healthy coping skills. The patient expressed that his unhealthy coping involves drinking/drugging and self-injury.  He was supportive of others during group.  Type of Therapy:  Group Therapy - Process   Participation Level:  Active  Participation Quality:  Attentive, Sharing and Supportive  Affect:  Appropriate  Cognitive:  Appropriate and Oriented  Insight:  Engaged  Engagement in Therapy:  Engaged  Modes of Intervention:  Education, Motivational Interviewing  Selmer Dominion, LCSW 12/30/2014, 12:35 PM

## 2014-12-30 NOTE — Discharge Summary (Signed)
Physician Discharge Summary Note  Patient:  [name] is an 44 y.o., male MRN:  [id number] DOB:  01-17-1971 Patient phone:  [phone] (home)  Patient address:   [address] [place] [address],  Total Time spent with patient: 45 minutes  Date of Admission:  12/25/2014 Date of Discharge: 12/30/2014  Reason for Admission:  Substance abuse  Principal Problem: Alcohol withdrawal Discharge Diagnoses: Patient Active Problem List   Diagnosis Date Noted  . Alcohol withdrawal [F10.239] 12/25/2014  . Schizoaffective disorder, depressive type [F25.1]   . Alcohol use disorder, severe, dependence [F10.20] 11/29/2014  . Current smoker [Z72.0] 11/20/2014  . Tremors [G25.2] 11/03/2014  . Chronic posttraumatic stress disorder [F43.12]   . Protein-calorie malnutrition, severe [E43] 10/19/2014  . Hallucinations, visual [R44.1]   . Noncompliance with medication regimen [Z91.14]   . Hallucinations [R44.3] 07/07/2014  . Paranoia [F22] 07/07/2014  . Cannabis abuse [F12.10] 12/30/2013  . Schizoaffective disorder [F25.9] 12/29/2013  . Post traumatic stress disorder (PTSD) [F43.10] 11/03/2011    Musculoskeletal: Strength & Muscle Tone: within normal limits Gait & Station: normal Patient leans: N/A  Psychiatric Specialty Exam:  SEE SRA Physical Exam  Vitals reviewed.   Review of Systems  All other systems reviewed and are negative.   Blood pressure 131/85, pulse 79, temperature 99 F (37.2 C), temperature source Oral, resp. rate 18, height _0  (1.803 m), weight 90.719 kg (200 lb), SpO2 100 %.Body mass index is 27.91 kg/(m^2).  Have you used any form of tobacco in the last 30 days? (Cigarettes, Smokeless Tobacco, Cigars, and/or Pipes): Yes  Has this patient used any form of tobacco in the last 30 days? (Cigarettes, Smokeless Tobacco, Cigars, and/or Pipes) N/A  Past Medical History:  Past Medical History  Diagnosis Date  . Psychosis   . PTSD (post-traumatic stress  disorder)   . Seizure disorder     related to etoh seizure  . Alcohol abuse   . Schizoaffective disorder   . Anxiety     at age 77  . Depression     at age 68    Past Surgical History  Procedure Laterality Date  . Foot surgery      right  . Hand surgery     Family History:  Family History  Problem Relation Age of Onset  . Alcoholism Mother   . CAD Father   . Depression Brother    Social History:  History  Alcohol Use  . Yes    Comment: "I'm a binge drinker.[name] 4-40 ounces/day"     History  Drug Use  . 1.00 per week  . Special: Marijuana    Comment: THC 2 to 3 times per month    History   Social History  . Marital Status: Divorced    Spouse Name: N/A  . Number of Children: N/A  . Years of Education: N/A   Social History Main Topics  . Smoking status: Current Every Day Smoker -- 1.50 packs/day for 24 years    Types: Cigarettes  . Smokeless tobacco: Never Used  . Alcohol Use: Yes     Comment: "I'm a binge drinker.[name] 4-40 ounces/day"  . Drug Use: 1.00 per week    Special: Marijuana     Comment: THC 2 to 3 times per month  . Sexual Activity: Yes   Other Topics Concern  . None   Social History Narrative   Risk to Self: Is patient at risk for suicide?: Yes Risk to Others:   Prior Inpatient  Therapy:   Prior Outpatient Therapy:    Level of Care:  OP  Hospital Course:   [name], 44 Y/o male who initially presented to [facility] in a depressed mood stating he was in withdrawal.  He endorsed SI by cutting himself with a knife.  stated that started drinking two days after he left here last time. Reported that he "got messed up with a girl who liked to drink. He met her while at the unit."  She was not committed to stop drinking and triggered his relapse. Has been drinking for couple of weeks "hard and heavy" they shared a half gallon of rum or vodka. Has been drinking every day. States he is getting withdrawal starting Sunday night, Monday morning. States that he  has been taking the medications but he is drinking with them. Understands that the medications cant work well if he was to continue to drink. He reported having a mental health diagnosis of Schizoaffective Disorder. The Patient reports being prescribed Geodon, Zoloft, and Gabapentin and not being medication compliant. The Patient reports being admitted to [facility] 2x with last admission 11-29-2014 to 12-06-2014. The Patient reports not following up with discharge planning to go to [facility] for services.  Elzy Tomasello was admitted for Alcohol withdrawal and crisis management.  He was treated discharged with the medications listed below under Medication List.  Medical problems were identified and treated as needed.  Home medications were restarted as appropriate.  Improvement was monitored by observation and [name] daily report of symptom reduction.  Emotional and mental status was monitored by daily self-inventory reports completed by [name] and clinical staff.         Daelon Dunivan was evaluated by the treatment team for stability and plans for continued recovery upon discharge.  Demarius Archila motivation was an integral factor for scheduling further treatment.  Employment, transportation, bed availability, health status, family support, and any pending legal issues were also considered during his hospital stay.  He was offered further treatment options upon discharge including but not limited to Residential, Intensive Outpatient, and Outpatient treatment.  Archie Shea will follow up with the services as listed below under Follow Up Information.     Upon completion of this admission the patient was both mentally and medically stable for discharge denying suicidal/homicidal ideation, auditory/visual/tactile hallucinations, delusional thoughts and paranoia.      Consults:  psychiatry  Significant Diagnostic Studies:  labs: per ED  Discharge Vitals:   Blood pressure 131/85, pulse  79, temperature 99 F (37.2 C), temperature source Oral, resp. rate 18, height _0  (1.803 m), weight 90.719 kg (200 lb), SpO2 100 %. Body mass index is 27.91 kg/(m^2). Lab Results:   No results found for this or any previous visit (from the past 72 hour(s)).  Physical Findings: AIMS: Facial and Oral Movements Muscles of Facial Expression: None, normal Lips and Perioral Area: None, normal Jaw: None, normal Tongue: None, normal,Extremity Movements Upper (arms, wrists, hands, fingers): None, normal Lower (legs, knees, ankles, toes): None, normal, Trunk Movements Neck, shoulders, hips: None, normal, Overall Severity Severity of abnormal movements (highest score from questions above): None, normal Incapacitation due to abnormal movements: None, normal Patient's awareness of abnormal movements (rate only patient's report): No Awareness, Dental Status Current problems with teeth and/or dentures?: No Does patient usually wear dentures?: No  CIWA:  CIWA-Ar Total: 2 COWS:      See Psychiatric Specialty Exam and Suicide Risk Assessment completed by Attending Physician prior to discharge.  Discharge destination:  Home  Is patient on multiple antipsychotic therapies at discharge:  No   Has Patient had three or more failed trials of antipsychotic monotherapy by history:  No    Recommended Plan for Multiple Antipsychotic Therapies: NA     Medication List    STOP taking these medications        hydrOXYzine 25 MG tablet  Commonly known as:  ATARAX/VISTARIL     naproxen sodium 220 MG tablet  Commonly known as:  ANAPROX      TAKE these medications      Indication   gabapentin 400 MG capsule  Commonly known as:  NEURONTIN  Take 1 tab (400 mg) three times during the day.  Then take 1 tab (400 mg) at bedtime.   Indication:  Agitation, Neuropathic Pain     nicotine 21 mg/24hr patch  Commonly known as:  NICODERM CQ - dosed in mg/24 hours  Place 1 patch (21 mg total) onto the skin  daily.   Indication:  Nicotine Addiction     sertraline 50 MG tablet  Commonly known as:  ZOLOFT  Take 1 tablet (50 mg total) by mouth daily.   Indication:  Major Depressive Disorder     traZODone 50 MG tablet  Commonly known as:  DESYREL  Take 1 tablet (50 mg total) by mouth at bedtime and may repeat dose one time if needed.   Indication:  Trouble Sleeping     ziprasidone 80 MG capsule  Commonly known as:  GEODON  Take 1 capsule (80 mg total) by mouth 2 (two) times daily with a meal.   Indication:  Manic-Depression     zolpidem 10 MG tablet  Commonly known as:  AMBIEN  Take 1 tablet (10 mg total) by mouth at bedtime as needed for sleep.   Indication:  Trouble Sleeping           Follow-up Information    Follow up with [facility] On 02/05/2015.   Specialty:  Behavioral Health   Why:  Medication management appointment on Monday June 20th at 10:20am. Please go to walk-in clinic prior to appointment to be seen for a "hospital follow up medication check" and let them know you are an established patient to be seen sooner.   Contact information:   [place] [address] [phone]       Follow-up recommendations:  Activity:  as tol, diet as tol  Comments:  1.  Take all your medications as prescribed.              2.  Report any adverse side effects to outpatient provider.                       3.  Patient instructed to not use alcohol or illegal drugs while on prescription medicines.            4.  In the event of worsening symptoms, instructed patient to call 911, the crisis hotline or go to nearest emergency room for evaluation of symptoms.  Total Discharge Time: 40 min  Signed: Freda Munro May Agustin AGNP-BC 12/30/2014, 3:27 PM  I have examined the patient and agree with the discharge plan and findings. I have also done suicide assessment on this patient.

## 2014-12-30 NOTE — [facility] Suicide Risk Assessment (Signed)
[facility] Discharge Suicide Risk Assessment   Demographic Factors:  Male  Total Time spent with patient: 30 minutes  Musculoskeletal: Strength & Muscle Tone: within normal limits Gait & Station: normal Patient leans: no lean  Psychiatric Specialty Exam: Physical Exam  Constitutional: He appears well-developed.    Review of Systems  Constitutional: Negative.   Psychiatric/Behavioral: Negative for depression and suicidal ideas.    Blood pressure 131/85, pulse 79, temperature 99 F (37.2 C), temperature source Oral, resp. rate 18, height 5\' 11"  (1.803 m), weight 90.719 kg (200 lb), SpO2 100 %.Body mass index is 27.91 kg/(m^2).  General Appearance: Casual  Eye Contact::  Fair  Speech:  Slow409  Volume:  Normal  Mood:  Euthymic  Affect:  Appropriate  Thought Process:  Coherent  Orientation:  Full (Time, Place, and Person)  Thought Content:  Rumination  Suicidal Thoughts:  No  Homicidal Thoughts:  No  Memory:  Immediate;   Fair Recent;   Fair  Judgement:  Fair  Insight:  Shallow  Psychomotor Activity:  Normal  Concentration:  Fair  Recall:  [employer] of Knowledge:Fair  Language: Fair  Akathisia:  Negative  Handed:  Right  AIMS (if indicated):     Assets:  Desire for Improvement Social Support  Sleep:  Number of Hours: 4.25  Cognition: WNL  ADL's:  Intact   Have you used any form of tobacco in the last 30 days? (Cigarettes, Smokeless Tobacco, Cigars, and/or Pipes): Yes  Has this patient used any form of tobacco in the last 30 days? (Cigarettes, Smokeless Tobacco, Cigars, and/or Pipes) Yes, Prescription not provided because: not interested  Mental Status Per Nursing Assessment::   On Admission:     Current Mental Status by Physician: see MSE  Loss Factors: Loss of significant relationship  Historical Factors: Impulsivity  Risk Reduction Factors:   Positive therapeutic relationship and Positive coping skills or problem solving skills  Continued Clinical  Symptoms:  Dysthymia Previous Psychiatric Diagnoses and Treatments  Cognitive Features That Contribute To Risk:  None    Suicide Risk:  Minimal: No identifiable suicidal ideation.  Patients presenting with no risk factors but with morbid ruminations; may be classified as minimal risk based on the severity of the depressive symptoms  Principal Problem: Alcohol withdrawal Discharge Diagnoses:  Patient Active Problem List   Diagnosis Date Noted  . Alcohol withdrawal [F10.239] 12/25/2014  . Schizoaffective disorder, depressive type [F25.1]   . Alcohol use disorder, severe, dependence [F10.20] 11/29/2014  . Current smoker [Z72.0] 11/20/2014  . Tremors [G25.2] 11/03/2014  . Chronic posttraumatic stress disorder [F43.12]   . Protein-calorie malnutrition, severe [E43] 10/19/2014  . Hallucinations, visual [R44.1]   . Noncompliance with medication regimen [Z91.14]   . Hallucinations [R44.3] 07/07/2014  . Paranoia [F22] 07/07/2014  . Cannabis abuse [F12.10] 12/30/2013  . Schizoaffective disorder [F25.9] 12/29/2013  . Post traumatic stress disorder (PTSD) [F43.10] 11/03/2011    Follow-up Information    Follow up with [facility] On 02/05/2015.   Specialty:  Behavioral Health   Why:  Medication management appointment on Monday June 20th at 10:20am. Please go to walk-in clinic prior to appointment to be seen for a "hospital follow up medication check" and let them know you are an established patient to be seen sooner.   Contact information:   [place] [address] [phone]       Plan Of Care/Follow-up recommendations:  Activity:  as tolerated Diet:  regular  See Discharge summary for details and follow  ups.  Is patient on multiple antipsychotic therapies at discharge:  No   Has Patient had three or more failed trials of antipsychotic monotherapy by history:  No  Recommended Plan for Multiple Antipsychotic Therapies: NA    Stephene Alegria 12/30/2014, 11:57 AM

## 2014-12-30 NOTE — [facility] Group Notes (Signed)
[facility] Group Notes:  (Nursing/MHT/Case Management/Adjunct)  Date:  12/30/2014  Time:  0900  Type of Therapy:  Nurse Education  Participation Level:  Active  Participation Quality:  Attentive  Affect:  Blunted  Cognitive:  Alert  Insight:  Good  Engagement in Group:  Engaged  Modes of Intervention:  Discussion, Education and Support  Summary of Progress/Problems: Patient states he is leaving today and plans to focus on his coping skills upon discharge, the main one being his guitar playing.  Loletta Specter [facility] 12/30/2014, 0900

## 2014-12-30 NOTE — Progress Notes (Signed)
Patient verbalizes readiness for discharge. Teaching explained, Rx's given and samples provided. Belongings returned. Patient verbalizes understanding. Denies SI/HI/AVH and is discharged in stable condition to his brother. [name]

## 2014-12-30 NOTE — Progress Notes (Signed)
Patient up and visible in the milieu. States he is discharging today and feels ready. Affect remains flat however this appears to be patient's baseline. Reports mood has stabilized. No pain or physical problems. Patient medicated per orders. Supported and encouraged. Patient states his brother will be here to pick him up at 1400. Will advise provider. Denies SI/HI and remains safe. [name]

## 2015-02-02 ENCOUNTER — Emergency Department (HOSPITAL_COMMUNITY)
Admission: EM | Admit: 2015-02-02 | Discharge: 2015-02-03 | Disposition: A | Discharge status: Home / Self Care | Payer: Medicare Other | Attending: Emergency Medicine | Admitting: Emergency Medicine

## 2015-02-02 DIAGNOSIS — Z79899 Other long term (current) drug therapy: Secondary | ICD-10-CM | POA: Diagnosis not present

## 2015-02-02 DIAGNOSIS — F329 Major depressive disorder, single episode, unspecified: Secondary | ICD-10-CM | POA: Diagnosis not present

## 2015-02-02 DIAGNOSIS — Z8669 Personal history of other diseases of the nervous system and sense organs: Secondary | ICD-10-CM | POA: Diagnosis not present

## 2015-02-02 DIAGNOSIS — Z720 Tobacco use: Secondary | ICD-10-CM | POA: Insufficient documentation

## 2015-02-02 DIAGNOSIS — F1210 Cannabis abuse, uncomplicated: Secondary | ICD-10-CM | POA: Insufficient documentation

## 2015-02-02 DIAGNOSIS — F419 Anxiety disorder, unspecified: Secondary | ICD-10-CM | POA: Insufficient documentation

## 2015-02-02 DIAGNOSIS — E878 Other disorders of electrolyte and fluid balance, not elsewhere classified: Secondary | ICD-10-CM | POA: Diagnosis not present

## 2015-02-02 DIAGNOSIS — K2970 Gastritis, unspecified, without bleeding: Secondary | ICD-10-CM | POA: Diagnosis not present

## 2015-02-02 DIAGNOSIS — F251 Schizoaffective disorder, depressive type: Secondary | ICD-10-CM | POA: Diagnosis not present

## 2015-02-02 DIAGNOSIS — F10129 Alcohol abuse with intoxication, unspecified: Secondary | ICD-10-CM | POA: Diagnosis present

## 2015-02-02 DIAGNOSIS — F1020 Alcohol dependence, uncomplicated: Secondary | ICD-10-CM

## 2015-02-02 DIAGNOSIS — F4310 Post-traumatic stress disorder, unspecified: Secondary | ICD-10-CM | POA: Diagnosis not present

## 2015-02-02 DIAGNOSIS — R112 Nausea with vomiting, unspecified: Secondary | ICD-10-CM | POA: Diagnosis not present

## 2015-02-02 DIAGNOSIS — F10229 Alcohol dependence with intoxication, unspecified: Secondary | ICD-10-CM | POA: Insufficient documentation

## 2015-02-02 NOTE — ED Notes (Signed)
Brought in by EMS from home with c/o alcohol withdrawal symptoms and suicidal ideations.  Pt reports that he is having alcohol withdrawal---- has been having nausea and vomiting and severe tremors and anxiety.  Pt also reports that he has been "wanting to sleep and never wake up"---- no specific plan.  Pt has had his last drink yesterday at around 1500.

## 2015-02-03 ENCOUNTER — Inpatient Hospital Stay (HOSPITAL_COMMUNITY)
Admission: AD | Admit: 2015-02-03 | Discharge: 2015-02-09 | DRG: 885 | Disposition: A | Discharge status: Home / Self Care | Payer: Medicare Other | Source: Intra-hospital | Attending: Psychiatry | Admitting: Psychiatry

## 2015-02-03 ENCOUNTER — Encounter (HOSPITAL_COMMUNITY): Payer: Self-pay | Admitting: Emergency Medicine

## 2015-02-03 ENCOUNTER — Encounter (HOSPITAL_COMMUNITY): Payer: Self-pay | Admitting: *Deleted

## 2015-02-03 DIAGNOSIS — F259 Schizoaffective disorder, unspecified: Secondary | ICD-10-CM | POA: Diagnosis not present

## 2015-02-03 DIAGNOSIS — F10229 Alcohol dependence with intoxication, unspecified: Secondary | ICD-10-CM | POA: Diagnosis not present

## 2015-02-03 DIAGNOSIS — R45851 Suicidal ideations: Secondary | ICD-10-CM

## 2015-02-03 DIAGNOSIS — F1020 Alcohol dependence, uncomplicated: Secondary | ICD-10-CM | POA: Diagnosis present

## 2015-02-03 DIAGNOSIS — Z811 Family history of alcohol abuse and dependence: Secondary | ICD-10-CM

## 2015-02-03 DIAGNOSIS — F10230 Alcohol dependence with withdrawal, uncomplicated: Secondary | ICD-10-CM | POA: Diagnosis not present

## 2015-02-03 DIAGNOSIS — F17210 Nicotine dependence, cigarettes, uncomplicated: Secondary | ICD-10-CM | POA: Diagnosis present

## 2015-02-03 DIAGNOSIS — F10239 Alcohol dependence with withdrawal, unspecified: Secondary | ICD-10-CM | POA: Diagnosis present

## 2015-02-03 DIAGNOSIS — F1290 Cannabis use, unspecified, uncomplicated: Secondary | ICD-10-CM | POA: Diagnosis present

## 2015-02-03 DIAGNOSIS — F1024 Alcohol dependence with alcohol-induced mood disorder: Secondary | ICD-10-CM

## 2015-02-03 DIAGNOSIS — F333 Major depressive disorder, recurrent, severe with psychotic symptoms: Principal | ICD-10-CM | POA: Diagnosis present

## 2015-02-03 LAB — COMPREHENSIVE METABOLIC PANEL
ALT: 181 U/L — ABNORMAL HIGH (ref 17–63)
ALT: 130 U/L — ABNORMAL HIGH (ref 17–63)
AST: 214 U/L — ABNORMAL HIGH (ref 15–41)
AST: 144 U/L — ABNORMAL HIGH (ref 15–41)
Albumin: 5.3 g/dL — ABNORMAL HIGH (ref 3.5–5.0)
Albumin: 4.1 g/dL (ref 3.5–5.0)
Alkaline Phosphatase: 95 U/L (ref 38–126)
Alkaline Phosphatase: 70 U/L (ref 38–126)
Anion gap: 28 — ABNORMAL HIGH (ref 5–15)
Anion gap: 14 (ref 5–15)
BUN: 12 mg/dL (ref 6–20)
BUN: 11 mg/dL (ref 6–20)
CO2: 24 mmol/L (ref 22–32)
CO2: 29 mmol/L (ref 22–32)
Calcium: 9.4 mg/dL (ref 8.9–10.3)
Calcium: 8.4 mg/dL — ABNORMAL LOW (ref 8.9–10.3)
Chloride: 82 mmol/L — ABNORMAL LOW (ref 101–111)
Chloride: 89 mmol/L — ABNORMAL LOW (ref 101–111)
Creatinine, Ser: 0.9 mg/dL (ref 0.61–1.24)
Creatinine, Ser: 0.72 mg/dL (ref 0.61–1.24)
GFR calc Af Amer: >60 mL/min (ref >60)
GFR calc Af Amer: >60 mL/min (ref >60)
GFR calc non Af Amer: >60 mL/min (ref >60)
GFR calc non Af Amer: >60 mL/min (ref >60)
Glucose, Bld: 95 mg/dL (ref 65–99)
Glucose, Bld: 100 mg/dL — ABNORMAL HIGH (ref 65–99)
Potassium: 3.7 mmol/L (ref 3.5–5.1)
Potassium: 3.4 mmol/L — ABNORMAL LOW (ref 3.5–5.1)
Sodium: 134 mmol/L — ABNORMAL LOW (ref 135–145)
Sodium: 132 mmol/L — ABNORMAL LOW (ref 135–145)
Total Bilirubin: 1.1 mg/dL (ref 0.3–1.2)
Total Bilirubin: 0.8 mg/dL (ref 0.3–1.2)
Total Protein: 8.5 g/dL — ABNORMAL HIGH (ref 6.5–8.1)
Total Protein: 6.5 g/dL (ref 6.5–8.1)

## 2015-02-03 LAB — CBC
HCT: 50.8 % (ref 39.0–52.0)
Hemoglobin: 17.9 g/dL — ABNORMAL HIGH (ref 13.0–17.0)
MCH: 33 pg (ref 26.0–34.0)
MCHC: 35.2 g/dL (ref 30.0–36.0)
MCV: 93.7 fL (ref 78.0–100.0)
Platelets: 206 10*3/uL (ref 150–400)
RBC: 5.42 MIL/uL (ref 4.22–5.81)
RDW: 13.9 % (ref 11.5–15.5)
WBC: 4.9 10*3/uL (ref 4.0–10.5)

## 2015-02-03 LAB — ACETAMINOPHEN LEVEL: Acetaminophen (Tylenol), Serum: <10 ug/mL — ABNORMAL LOW (ref 10–30)

## 2015-02-03 LAB — RAPID URINE DRUG SCREEN, HOSP PERFORMED
Amphetamines: NOT DETECTED
Barbiturates: NOT DETECTED
Benzodiazepines: NOT DETECTED
Cocaine: NOT DETECTED
Opiates: NOT DETECTED
Tetrahydrocannabinol: POSITIVE — AB

## 2015-02-03 LAB — SALICYLATE LEVEL: Salicylate Lvl: <4 mg/dL (ref 2.8–30.0)

## 2015-02-03 LAB — ETHANOL: Alcohol, Ethyl (B): 194 mg/dL — ABNORMAL HIGH (ref <5)

## 2015-02-03 MED ORDER — ONDANSETRON HCL 4 MG/2ML IJ SOLN
4.0000 mg | Freq: Once | INTRAMUSCULAR | Status: AC
  Administered 2015-02-03: 4 mg via INTRAVENOUS
  Filled 2015-02-03: qty 2

## 2015-02-03 MED ORDER — NICOTINE 21 MG/24HR TD PT24
21.0000 mg | MEDICATED_PATCH | Freq: Every day | TRANSDERMAL | Status: DC
  Administered 2015-02-04: 21 mg via TRANSDERMAL
  Filled 2015-02-03 (×3): qty 1

## 2015-02-03 MED ORDER — ACETAMINOPHEN 325 MG PO TABS
650.0000 mg | ORAL_TABLET | ORAL | Status: DC | PRN
  Administered 2015-02-03 (×2): 650 mg via ORAL
  Filled 2015-02-03 (×2): qty 2

## 2015-02-03 MED ORDER — LORAZEPAM 1 MG PO TABS
0.0000 mg | ORAL_TABLET | Freq: Four times a day (QID) | ORAL | Status: DC
  Administered 2015-02-03 (×3): 2 mg via ORAL
  Filled 2015-02-03 (×3): qty 2

## 2015-02-03 MED ORDER — ONDANSETRON 4 MG PO TBDP: 4.0000 mg | ORAL_TABLET | Freq: Four times a day (QID) | ORAL | Status: AC | PRN

## 2015-02-03 MED ORDER — VITAMIN B-1 100 MG PO TABS
100.0000 mg | ORAL_TABLET | Freq: Every day | ORAL | Status: DC
  Administered 2015-02-04 – 2015-02-09 (×6): 100 mg via ORAL
  Filled 2015-02-03 (×8): qty 1

## 2015-02-03 MED ORDER — MAGNESIUM HYDROXIDE 400 MG/5ML PO SUSP
30.0000 mL | Freq: Every day | ORAL | Status: DC | PRN
Start: 2015-02-03 — End: 2015-02-09

## 2015-02-03 MED ORDER — ADULT MULTIVITAMIN W/MINERALS CH
1.0000 | ORAL_TABLET | Freq: Every day | ORAL | Status: DC
  Administered 2015-02-03 – 2015-02-09 (×7): 1 via ORAL
  Filled 2015-02-03 (×8): qty 1

## 2015-02-03 MED ORDER — SERTRALINE HCL 50 MG PO TABS
50.0000 mg | ORAL_TABLET | Freq: Every day | ORAL | Status: DC
  Administered 2015-02-03 – 2015-02-05 (×3): 50 mg via ORAL
  Filled 2015-02-03 (×4): qty 1

## 2015-02-03 MED ORDER — LORAZEPAM 1 MG PO TABS
1.0000 mg | ORAL_TABLET | Freq: Four times a day (QID) | ORAL | Status: AC | PRN
  Administered 2015-02-04 – 2015-02-05 (×4): 1 mg via ORAL
  Filled 2015-02-03 (×3): qty 1

## 2015-02-03 MED ORDER — LORAZEPAM 1 MG PO TABS
1.0000 mg | ORAL_TABLET | Freq: Every day | ORAL | Status: AC
  Administered 2015-02-07: 1 mg via ORAL
  Filled 2015-02-03: qty 1

## 2015-02-03 MED ORDER — NICOTINE 21 MG/24HR TD PT24
21.0000 mg | MEDICATED_PATCH | Freq: Every day | TRANSDERMAL | Status: DC
  Administered 2015-02-03: 21 mg via TRANSDERMAL
  Filled 2015-02-03: qty 1

## 2015-02-03 MED ORDER — LORAZEPAM 1 MG PO TABS
1.0000 mg | ORAL_TABLET | Freq: Three times a day (TID) | ORAL | Status: AC
  Administered 2015-02-04 – 2015-02-05 (×3): 1 mg via ORAL
  Filled 2015-02-03 (×3): qty 1

## 2015-02-03 MED ORDER — NICOTINE 21 MG/24HR TD PT24
21.0000 mg | MEDICATED_PATCH | Freq: Every day | TRANSDERMAL | Status: DC
  Administered 2015-02-05 – 2015-02-08 (×4): 21 mg via TRANSDERMAL
  Filled 2015-02-03 (×4): qty 1
  Filled 2015-02-03: qty 14
  Filled 2015-02-03 (×4): qty 1

## 2015-02-03 MED ORDER — HYDROXYZINE HCL 25 MG PO TABS
25.0000 mg | ORAL_TABLET | Freq: Four times a day (QID) | ORAL | Status: AC | PRN
  Administered 2015-02-04: 25 mg via ORAL
  Filled 2015-02-03: qty 1

## 2015-02-03 MED ORDER — ZIPRASIDONE HCL 20 MG PO CAPS
20.0000 mg | ORAL_CAPSULE | Freq: Two times a day (BID) | ORAL | Status: DC
  Administered 2015-02-03 – 2015-02-04 (×2): 20 mg via ORAL
  Filled 2015-02-03 (×6): qty 1

## 2015-02-03 MED ORDER — LORAZEPAM 1 MG PO TABS
1.0000 mg | ORAL_TABLET | Freq: Two times a day (BID) | ORAL | Status: AC
  Administered 2015-02-05 – 2015-02-06 (×2): 1 mg via ORAL
  Filled 2015-02-03 (×2): qty 1

## 2015-02-03 MED ORDER — LORAZEPAM 1 MG PO TABS
1.0000 mg | ORAL_TABLET | Freq: Four times a day (QID) | ORAL | Status: AC
  Administered 2015-02-03 – 2015-02-04 (×4): 1 mg via ORAL
  Filled 2015-02-03 (×3): qty 1

## 2015-02-03 MED ORDER — TRAZODONE HCL 50 MG PO TABS
50.0000 mg | ORAL_TABLET | Freq: Every evening | ORAL | Status: DC | PRN
  Administered 2015-02-03 – 2015-02-05 (×3): 50 mg via ORAL
  Filled 2015-02-03 (×17): qty 1

## 2015-02-03 MED ORDER — LORAZEPAM 1 MG PO TABS: 0.0000 mg | ORAL_TABLET | Freq: Two times a day (BID) | ORAL | Status: DC

## 2015-02-03 MED ORDER — GABAPENTIN 400 MG PO CAPS
ORAL_CAPSULE | ORAL | Status: AC
  Administered 2015-02-03: 400 mg via ORAL
  Filled 2015-02-03: qty 1

## 2015-02-03 MED ORDER — LORAZEPAM 1 MG PO TABS
ORAL_TABLET | ORAL | Status: AC
  Administered 2015-02-03: 1 mg via ORAL
  Filled 2015-02-03: qty 1

## 2015-02-03 MED ORDER — ALUM & MAG HYDROXIDE-SIMETH 200-200-20 MG/5ML PO SUSP
30.0000 mL | ORAL | Status: DC | PRN
Start: 2015-02-03 — End: 2015-02-09

## 2015-02-03 MED ORDER — LOPERAMIDE HCL 2 MG PO CAPS
2.0000 mg | ORAL_CAPSULE | ORAL | Status: AC | PRN
  Administered 2015-02-04: 2 mg via ORAL
  Administered 2015-02-04: 4 mg via ORAL
  Administered 2015-02-05: 2 mg via ORAL
  Filled 2015-02-03 (×2): qty 1
  Filled 2015-02-03: qty 2

## 2015-02-03 MED ORDER — SODIUM CHLORIDE 0.9 % IV BOLUS (SEPSIS)
1000.0000 mL | Freq: Once | INTRAVENOUS | Status: AC
  Administered 2015-02-03: 1000 mL via INTRAVENOUS

## 2015-02-03 MED ORDER — THIAMINE HCL 100 MG/ML IJ SOLN
Freq: Once | INTRAVENOUS | Status: AC
  Administered 2015-02-03: 05:00:00 via INTRAVENOUS
  Filled 2015-02-03: qty 1000

## 2015-02-03 MED ORDER — ACETAMINOPHEN 325 MG PO TABS
650.0000 mg | ORAL_TABLET | Freq: Four times a day (QID) | ORAL | Status: DC | PRN
  Administered 2015-02-03 – 2015-02-06 (×7): 650 mg via ORAL
  Filled 2015-02-03 (×7): qty 2

## 2015-02-03 MED ORDER — GABAPENTIN 400 MG PO CAPS
400.0000 mg | ORAL_CAPSULE | Freq: Four times a day (QID) | ORAL | Status: DC
  Administered 2015-02-03 – 2015-02-09 (×25): 400 mg via ORAL
  Filled 2015-02-03 (×2): qty 1
  Filled 2015-02-03: qty 56
  Filled 2015-02-03 (×12): qty 1
  Filled 2015-02-03 (×2): qty 56
  Filled 2015-02-03 (×10): qty 1
  Filled 2015-02-03: qty 56
  Filled 2015-02-03 (×6): qty 1

## 2015-02-03 MED ORDER — THIAMINE HCL 100 MG/ML IJ SOLN
100.0000 mg | Freq: Once | INTRAMUSCULAR | Status: AC
  Administered 2015-02-03: 100 mg via INTRAMUSCULAR
  Filled 2015-02-03: qty 2

## 2015-02-03 NOTE — [facility] Suicide Risk Assessment (Addendum)
[facility] Admission Suicide Risk Assessment   Nursing information obtained from:    Demographic factors:   44 year old divorced male, lives alone, on disability Current Mental Status:   see below  Loss Factors:   divorce, disability,  Historical Factors:   history of Schizoaffective Disorder, history of Alcohol Dependence  Risk Reduction Factors:   Resilience  Total Time spent with patient: 45 minutes Principal Problem: Alcohol use disorder, severe, dependence Diagnosis:   Patient Active Problem List   Diagnosis Date Noted  . Alcohol withdrawal [F10.239] 12/25/2014  . Schizoaffective disorder, depressive type [F25.1]   . Alcohol use disorder, severe, dependence [F10.20] 11/29/2014  . Current smoker [Z72.0] 11/20/2014  . Tremors [G25.2] 11/03/2014  . Chronic posttraumatic stress disorder [F43.12]   . Protein-calorie malnutrition, severe [E43] 10/19/2014  . Hallucinations, visual [R44.1]   . Noncompliance with medication regimen [Z91.14]   . Hallucinations [R44.3] 07/07/2014  . Paranoia [F22] 07/07/2014  . Cannabis abuse [F12.10] 12/30/2013  . Schizoaffective disorder [F25.9] 12/29/2013  . Post traumatic stress disorder (PTSD) [F43.10] 11/03/2011     Continued Clinical Symptoms:  Alcohol Use Disorder Identification Test Final Score (AUDIT): 24 The "Alcohol Use Disorders Identification Test", Guidelines for Use in Primary Care, Second Edition.  World Pharmacologist [facility]). Score between 0-7:  no or low risk or alcohol related problems. Score between 8-15:  moderate risk of alcohol related problems. Score between 16-19:  high risk of alcohol related problems. Score 20 or above:  warrants further diagnostic evaluation for alcohol dependence and treatment.   CLINICAL FACTORS:  44 year old  Divorced male,  Lives alone, on disability, who has a history of Schizoaffective Disorder, PTSD, and Alcohol Dependence. He has been feeling increasingly depressed and has also been drinking  heavily over recent weeks, up to one case of beer per day. Last drank yesterday. He had stopped his psychiatric medications  A few days ago. He describes auditory hallucinations " berating me" ( at this time denies command hallucinations) Due to worsening depression, with passive SI thoughts ( " I want to go to sleep and not wake up")" decided to come to hospital.  Of note, has a history of alcohol withdrawal seizures in the past . Dx- Schizoaffective Disorder, Depressed, and Alcohol Dependence  Plan- Admit to inpatient unit, Start Ativan detox , Restarted on Neurontin and Zoloft , will also restart Geodon, which he had been on without side effects, but as he has been off this medication for several days , will restart at lower dose, to minimize risk of side effects. Will obtain EKG to rule out QTc prolongation.    Musculoskeletal: Strength & Muscle Tone: within normal limits- positive distal tremors  Gait & Station: normal Patient leans: N/A  Psychiatric Specialty Exam: Physical Exam  ROS (+) headache, no chest pain, no SOB, no vomiting, no diarrhea.  Blood pressure 144/75, pulse 100, temperature 97.8 F (36.6 C), temperature source Oral, resp. rate 16, height 6' (1.829 m), weight 203 lb (92.08 kg).Body mass index is 27.53 kg/(m^2).  General Appearance: Disheveled  Eye Sport and exercise psychologist::  Fair  Speech:  Slow  Volume:  Decreased  Mood:  Depressed  Affect:  Constricted  Thought Process:  Linear  Orientation:  Other:  fully alert and attentive   Thought Content:  (+) auditory hallucinations, no delusions expressed, at this time does not appear internally preoccupied   Suicidal Thoughts:  Yes.  without intent/plan- at this time is able to contract for safety and denies plan or  intention of hurting self on unit   Homicidal Thoughts:  No  Memory:  recent and remote grossly intact   Judgement:  Fair  Insight:  Present  Psychomotor Activity:  Decreased- some distal tremors noted   Concentration:   Good  Recall:  Good  Fund of Knowledge:Good  Language: Good  Akathisia:  Negative  Handed:  Right  AIMS (if indicated):     Assets:  Desire for Improvement Resilience  Sleep:     Cognition: fully alert and attentive   ADL's:  Impaired     COGNITIVE FEATURES THAT CONTRIBUTE TO RISK:  Closed-mindedness and Loss of executive function    SUICIDE RISK:   Moderate:  Frequent suicidal ideation with limited intensity, and duration, some specificity in terms of plans, no associated intent, good self-control, limited dysphoria/symptomatology, some risk factors present, and identifiable protective factors, including available and accessible social support.  PLAN OF CARE: Patient will be admitted to inpatient psychiatric unit for stabilization and safety. Will provide and encourage milieu participation. Provide medication management and maked adjustments as needed. Will also provide medication management to minimize risk of alcohol WDL.  Will follow daily.    Medical Decision Making:  New problem, with additional work up planned, Review of Psycho-Social Stressors (1), Established Problem, Worsening (2) and Review of Medication Regimen & Side Effects (2)  I certify that inpatient services furnished can reasonably be expected to improve the patient's condition.   COBOS, FERNANDO 02/03/2015, 3:25 PM

## 2015-02-03 NOTE — ED Notes (Signed)
Called [facility] for transport to [facility]  

## 2015-02-03 NOTE — H&P (Signed)
Psychiatric Admission Assessment Adult  Patient Identification: [name] MRN:  [id number] Date of Evaluation:  02/03/2015 Chief Complaint:  "My drinking was making me very sick."  Principal Diagnosis: Alcohol use disorder, severe, dependence Diagnosis:   Patient Active Problem List   Diagnosis Date Noted  . Alcohol withdrawal [F10.239] 12/25/2014  . Schizoaffective disorder, depressive type [F25.1]   . Alcohol use disorder, severe, dependence [F10.20] 11/29/2014  . Current smoker [Z72.0] 11/20/2014  . Tremors [G25.2] 11/03/2014  . Chronic posttraumatic stress disorder [F43.12]   . Protein-calorie malnutrition, severe [E43] 10/19/2014  . Hallucinations, visual [R44.1]   . Noncompliance with medication regimen [Z91.14]   . Hallucinations [R44.3] 07/07/2014  . Paranoia [F22] 07/07/2014  . Cannabis abuse [F12.10] 12/30/2013  . Schizoaffective disorder [F25.9] 12/29/2013  . Post traumatic stress disorder (PTSD) [F43.10] 11/03/2011   History of Present Illness::  [name] is a 44 year old male who was brought to [facility] by EMS with concerns of alcohol withdrawal and suicidal thoughts. Patient was also experiencing auditory hallucinations calling him derogatory names. Patient reports he has been drinking since age [age] and has been drinking a case of beer a day for the past several weeks. He reports hx of alcohol related seizures in the past. Patient also uses THC about 1-2 per month. The patient had limited ability to participate in his psychiatric assessment today due to severe withdrawal symptoms. Ol[place]ide was recently discharged from [facility] on 12/25/14 and reports relapsing on alcohol about a week after leaving. Patient stated "I don't feel well. I am shaky and sweaty. Also depressed. I have been drinking a case of beer daily. It go to the point I felt like I was dying. I did not want to wake up. I think I was trying to drink myself to death." The patient has been started on the Ativan  protocol due to elevated liver enzymes. Rasool admits that he was not taking his medications as prescribed when he was abusing alcohol on a daily basis. He is currently feeling paranoid, which is possibly related to alcohol withdrawal symptoms.    Elements:  Location:  Schizoaffective disorder, alcohol dependence. Quality:  unable to function having SI. Severity:  severe. Timing:  every day. Duration:  for the last 2 weeks since he was D/C last time. Context:  has been drinking not compliant wiht his medications experiencing exacerbation of his depression with SI. Associated Signs/Symptoms: Depression Symptoms:  depressed mood, anhedonia, insomnia, psychomotor retardation, fatigue, feelings of worthlessness/guilt, difficulty concentrating, hopelessness, suicidal thoughts with specific plan, anxiety, panic attacks, loss of energy/fatigue, disturbed sleep, weight loss, (Hypo) Manic Symptoms:  Irritable Mood, Labiality of Mood, Anxiety Symptoms:  Excessive Worry, Panic Symptoms, Psychotic Symptoms:  Hallucinations: Auditory PTSD Symptoms: Re-experiencing:  Flashbacks Intrusive Thoughts Hypervigilance:  Yes Hyperarousal:  Increased Startle Response Total Time spent with patient: 45 minutes  Past Medical History:  Past Medical History  Diagnosis Date  . Psychosis   . PTSD (post-traumatic stress disorder)   . Seizure disorder     related to etoh seizure  . Alcohol abuse   . Schizoaffective disorder   . Anxiety     at age 44  . Depression     at age 17    Past Surgical History  Procedure Laterality Date  . Foot surgery      right  . Hand surgery     Family History:  Family History  Problem Relation Age of Onset  . Alcoholism Mother   . CAD Father   .  Depression Brother    Social History:  History  Alcohol Use  . Yes    Comment: "I'm a binge drinker.[name] 4-40 ounces/day"     History  Drug Use  . 1.00 per week  . Special: Marijuana    Comment: THC 2 to 3  times per month    History   Social History  . Marital Status: Divorced    Spouse Name: N/A  . Number of Children: N/A  . Years of Education: N/A   Social History Main Topics  . Smoking status: Current Every Day Smoker -- 1.50 packs/day for 24 years    Types: Cigarettes  . Smokeless tobacco: Never Used  . Alcohol Use: Yes     Comment: "I'm a binge drinker.[name] 4-40 ounces/day"  . Drug Use: 1.00 per week    Special: Marijuana     Comment: THC 2 to 3 times per month  . Sexual Activity: Yes   Other Topics Concern  . None   Social History Narrative  Lives by himself, gets disability, associated in [place], [place] up until 2012. Has a daughter in [place]. States that his mother is in [place] and is planning to come and help him pack and take him to [place] Additional Social History:    History of alcohol / drug use?: Yes Longest period of sobriety (when/how long): reports hx of seizures, thinks he had one last night but this was not confirmed, reports last confirmed seizure was several years ago, longest period of sobriety unknown  Negative Consequences of Use: Financial, Personal relationships Withdrawal Symptoms: Change in blood pressure, Seizures Name of Substance 1: etoh  1 - Amount (size/oz): case of beer 1 - Frequency: daily  1 - Duration: several weeks at this level, on and off for years 1 - Last Use / Amount: 02-02-15, about a case of beer BAL 194 2 - Age of First Use: teens 2 - Amount (size/oz): a joint 2 - Frequency: "occassionally" 1-2 per month  2 - Last Use / Amount: tested positive upon arrival, uncertain of last use                 Musculoskeletal: Strength & Muscle Tone: within normal limits Gait & Station: normal Patient leans: N/A  Psychiatric Specialty Exam: Physical Exam  Constitutional:  Physical exam findings reviewed from the [facility] and I concur with no noted exceptions.     Review of Systems  Constitutional: Positive for malaise/fatigue and  diaphoresis.  Eyes: Negative.   Respiratory:       Pack and a half   Gastrointestinal: Positive for nausea.  Genitourinary: Negative.   Musculoskeletal: Positive for back pain and neck pain.  Neurological: Positive for dizziness, tremors, weakness and headaches.  Endo/Heme/Allergies: Negative.   Psychiatric/Behavioral: Positive for depression, suicidal ideas, hallucinations and substance abuse. Negative for memory loss. The patient is nervous/anxious and has insomnia.     Blood pressure 144/75, pulse 100, temperature 97.8 F (36.6 C), temperature source Oral, height 6' (1.829 m), weight 92.08 kg (203 lb).Body mass index is 27.53 kg/(m^2).  General Appearance: Disheveled  Eye Sport and exercise psychologist::  Fair  Speech:  Clear and Coherent, Slow and not spontaneous  Volume:  Decreased  Mood:  Depressed  Affect:  Depressed and Restricted  Thought Process:  Coherent and Goal Directed  Orientation:  Full (Time, Place, and Person)  Thought Content:  events symptoms worries concerns  Suicidal Thoughts:  Yes.  with intent/plan  Homicidal Thoughts:  No  Memory:  Immediate;   Fair Recent;   Fair Remote;   Fair  Judgement:  Fair  Insight:  Present and Shallow  Psychomotor Activity:  Decreased  Concentration:  Fair  Recall:  [employer] of Knowledge:Fair  Language: Fair  Akathisia:  No  Handed:  Right  AIMS (if indicated):     Assets:  Desire for Improvement Housing Resilience  ADL's:  Intact  Cognition: WNL  Sleep:      Risk to Self: Is patient at risk for suicide?: Yes Risk to Others:   Prior Inpatient Therapy:  [place], [facility] few times, [facility], [place] Prior Outpatient Therapy:  [facility] last time before he came here last time  Alcohol Screening: 1. How often do you have a drink containing alcohol?: 4 or more times a week 2. How many drinks containing alcohol do you have on a typical day when you are drinking?: 10 or more 3.  How often do you have six or more drinks on one occasion?: Daily or almost daily Preliminary Score: 8 5. How often during the last year have you failed to do what was normally expected from you becasue of drinking?: Weekly 6. How often during the last year have you needed a first drink in the morning to get yourself going after a heavy drinking session?: Monthly 7. How often during the last year have you had a feeling of guilt of remorse after drinking?: Less than monthly 8. How often during the last year have you been unable to remember what happened the night before because you had been drinking?: Never 9. Have you or someone else been injured as a result of your drinking?: Yes, but not in the last year 10. Has a relative or friend or a doctor or another health worker been concerned about your drinking or suggested you cut down?: Yes, during the last year Alcohol Use Disorder Identification Test Final Score (AUDIT): 24 Brief Intervention: Yes  Allergies:   Allergies  Allergen Reactions  . Wellbutrin [Bupropion] Other (See Comments)    Makes me feel like I'm have a seizure   Lab Results:  Results for orders placed or performed during the hospital encounter of 02/02/15 (from the past 48 hour(s))  Urine rapid drug screen (hosp performed)not at [facility]     Status: Abnormal   Collection Time: 02/03/15 12:31 AM  Result Value Ref Range   Opiates NONE DETECTED NONE DETECTED   Cocaine NONE DETECTED NONE DETECTED   Benzodiazepines NONE DETECTED NONE DETECTED   Amphetamines NONE DETECTED NONE DETECTED   Tetrahydrocannabinol POSITIVE (A) NONE DETECTED   Barbiturates NONE DETECTED NONE DETECTED    Comment:        DRUG SCREEN FOR MEDICAL PURPOSES ONLY.  IF CONFIRMATION IS NEEDED FOR ANY PURPOSE, NOTIFY LAB WITHIN 5 DAYS.        LOWEST DETECTABLE LIMITS FOR URINE DRUG SCREEN Drug Class       Cutoff (ng/mL) Amphetamine      1000 Barbiturate      200 Benzodiazepine   774 Tricyclics        142 Opiates          300 Cocaine          300 THC              50   CBC     Status: Abnormal   Collection Time: 02/03/15 12:41 AM  Result Value Ref Range   WBC 4.9 4.0 -  10.5 K/uL   RBC 5.42 4.22 - 5.81 MIL/uL   Hemoglobin 17.9 (H) 13.0 - 17.0 g/dL   HCT 50.8 39.0 - 52.0 %   MCV 93.7 78.0 - 100.0 fL   MCH 33.0 26.0 - 34.0 pg   MCHC 35.2 30.0 - 36.0 g/dL   RDW 13.9 11.5 - 15.5 %   Platelets 206 150 - 400 K/uL  Comprehensive metabolic panel     Status: Abnormal   Collection Time: 02/03/15 12:41 AM  Result Value Ref Range   Sodium 134 (L) 135 - 145 mmol/L   Potassium 3.7 3.5 - 5.1 mmol/L   Chloride 82 (L) 101 - 111 mmol/L   CO2 24 22 - 32 mmol/L   Glucose, Bld 95 65 - 99 mg/dL   BUN 12 6 - 20 mg/dL   Creatinine, Ser 0.90 0.61 - 1.24 mg/dL   Calcium 9.4 8.9 - 10.3 mg/dL   Total Protein 8.5 (H) 6.5 - 8.1 g/dL   Albumin 5.3 (H) 3.5 - 5.0 g/dL   AST 214 (H) 15 - 41 U/L   ALT 181 (H) 17 - 63 U/L   Alkaline Phosphatase 95 38 - 126 U/L   Total Bilirubin 1.1 0.3 - 1.2 mg/dL   GFR calc non Af Amer >60 >60 mL/min   GFR calc Af Amer >60 >60 mL/min    Comment: (NOTE) The eGFR has been calculated using the CKD EPI equation. This calculation has not been validated in all clinical situations. eGFR's persistently <60 mL/min signify possible Chronic Kidney Disease.    Anion gap 28 (H) 5 - 15  Ethanol (ETOH)     Status: Abnormal   Collection Time: 02/03/15 12:41 AM  Result Value Ref Range   Alcohol, Ethyl (B) 194 (H) <5 mg/dL    Comment:        LOWEST DETECTABLE LIMIT FOR SERUM ALCOHOL IS 5 mg/dL FOR MEDICAL PURPOSES ONLY   Acetaminophen level     Status: Abnormal   Collection Time: 02/03/15 12:41 AM  Result Value Ref Range   Acetaminophen (Tylenol), Serum <10 (L) 10 - 30 ug/mL    Comment:        THERAPEUTIC CONCENTRATIONS VARY SIGNIFICANTLY. A RANGE OF 10-30 ug/mL MAY BE AN EFFECTIVE CONCENTRATION FOR MANY PATIENTS. HOWEVER, SOME ARE BEST TREATED AT CONCENTRATIONS OUTSIDE  THIS RANGE. ACETAMINOPHEN CONCENTRATIONS >150 ug/mL AT 4 HOURS AFTER INGESTION AND >50 ug/mL AT 12 HOURS AFTER INGESTION ARE OFTEN ASSOCIATED WITH TOXIC REACTIONS.   Salicylate level     Status: None   Collection Time: 02/03/15 12:41 AM  Result Value Ref Range   Salicylate Lvl <9.5 2.8 - 30.0 mg/dL  Comprehensive metabolic panel     Status: Abnormal   Collection Time: 02/03/15  8:20 AM  Result Value Ref Range   Sodium 132 (L) 135 - 145 mmol/L   Potassium 3.4 (L) 3.5 - 5.1 mmol/L   Chloride 89 (L) 101 - 111 mmol/L   CO2 29 22 - 32 mmol/L   Glucose, Bld 100 (H) 65 - 99 mg/dL   BUN 11 6 - 20 mg/dL   Creatinine, Ser 0.72 0.61 - 1.24 mg/dL   Calcium 8.4 (L) 8.9 - 10.3 mg/dL   Total Protein 6.5 6.5 - 8.1 g/dL   Albumin 4.1 3.5 - 5.0 g/dL   AST 144 (H) 15 - 41 U/L   ALT 130 (H) 17 - 63 U/L   Alkaline Phosphatase 70 38 - 126 U/L   Total Bilirubin 0.8 0.3 -  1.2 mg/dL   GFR calc non Af Amer >60 >60 mL/min   GFR calc Af Amer >60 >60 mL/min    Comment: (NOTE) The eGFR has been calculated using the CKD EPI equation. This calculation has not been validated in all clinical situations. eGFR's persistently <60 mL/min signify possible Chronic Kidney Disease.    Anion gap 14 5 - 15   Current Medications: Current Facility-Administered Medications  Medication Dose Route Frequency Provider Last Rate Last Dose  . acetaminophen (TYLENOL) tablet 650 mg  650 mg Oral Q6H PRN Encarnacion Slates, NP      . alum & mag hydroxide-simeth (MAALOX/MYLANTA) 200-200-20 MG/5ML suspension 30 mL  30 mL Oral Q4H PRN Encarnacion Slates, NP      . gabapentin (NEURONTIN) capsule 400 mg  400 mg Oral QID Encarnacion Slates, NP      . hydrOXYzine (ATARAX/VISTARIL) tablet 25 mg  25 mg Oral Q6H PRN Encarnacion Slates, NP      . loperamide (IMODIUM) capsule 2-4 mg  2-4 mg Oral PRN Encarnacion Slates, NP      . LORazepam (ATIVAN) tablet 1 mg  1 mg Oral Q6H PRN Encarnacion Slates, NP      . LORazepam (ATIVAN) tablet 1 mg  1 mg Oral QID Encarnacion Slates, NP       Followed by  . [START ON 02/04/2015] LORazepam (ATIVAN) tablet 1 mg  1 mg Oral TID Encarnacion Slates, NP       Followed by  . [START ON 02/05/2015] LORazepam (ATIVAN) tablet 1 mg  1 mg Oral BID Encarnacion Slates, NP       Followed by  . [START ON 02/07/2015] LORazepam (ATIVAN) tablet 1 mg  1 mg Oral Daily Encarnacion Slates, NP      . magnesium hydroxide (MILK OF MAGNESIA) suspension 30 mL  30 mL Oral Daily PRN Encarnacion Slates, NP      . multivitamin with minerals tablet 1 tablet  1 tablet Oral Daily Encarnacion Slates, NP   1 tablet at 02/03/15 1354  . [START ON 02/04/2015] nicotine (NICODERM CQ - dosed in mg/24 hours) patch 21 mg  21 mg Transdermal Q0600 Encarnacion Slates, NP      . nicotine (NICODERM CQ - dosed in mg/24 hours) patch 21 mg  21 mg Transdermal Daily Encarnacion Slates, NP      . ondansetron (ZOFRAN-ODT) disintegrating tablet 4 mg  4 mg Oral Q6H PRN Encarnacion Slates, NP      . sertraline (ZOLOFT) tablet 50 mg  50 mg Oral Daily Encarnacion Slates, NP   50 mg at 02/03/15 1355  . thiamine (B-1) injection 100 mg  100 mg Intramuscular Once Encarnacion Slates, NP      . Derrill Memo ON 02/04/2015] thiamine (VITAMIN B-1) tablet 100 mg  100 mg Oral Daily Encarnacion Slates, NP      . traZODone (DESYREL) tablet 50 mg  50 mg Oral QHS,MR X 1 Encarnacion Slates, NP       PTA Medications: Prescriptions prior to admission  Medication Sig Dispense Refill Last Dose  . gabapentin (NEURONTIN) 400 MG capsule Take 1 tab (400 mg) three times during the day.  Then take 1 tab (400 mg) at bedtime. (Patient taking differently: Take 400 mg by mouth 4 (four) times daily. Take 1 tab (400 mg) three times during the day.  Then take 1 tab (400 mg) at bedtime.) 120 capsule  0 Past Week at Unknown time  . sertraline (ZOLOFT) 50 MG tablet Take 1 tablet (50 mg total) by mouth daily. 30 tablet 0 Past Week at Unknown time  . traZODone (DESYREL) 50 MG tablet Take 1 tablet (50 mg total) by mouth at bedtime and may repeat dose one time if needed. (Patient taking  differently: Take 50 mg by mouth at bedtime as needed for sleep. ) 30 tablet 0 Past Week at Unknown time  . ziprasidone (GEODON) 80 MG capsule Take 1 capsule (80 mg total) by mouth 2 (two) times daily with a meal. 60 capsule 0 Past Week at Unknown time  . nicotine (NICODERM CQ - DOSED IN MG/24 HOURS) 21 mg/24hr patch Place 1 patch (21 mg total) onto the skin daily. (Patient not taking: Reported on 02/03/2015) 28 patch 0     Previous Psychotropic Medications: Yes Geodon 80 mg BID, Zoloft 50 mg daily, Gabapentin 400 mg QID Vistaril Trazodone, past history of taking Seroquel Tegretol   Substance Abuse History in the last 12 months:  Yes.   Daily alcohol abuse, occasional marijuana use with positive urine drug screen   Consequences of Substance Abuse: Legal Consequences:  DWI x 2 Blackouts:   DT's: Withdrawal Symptoms:   Diaphoresis Diarrhea Headaches Nausea Tremors  Results for orders placed or performed during the hospital encounter of 02/02/15 (from the past 72 hour(s))  Urine rapid drug screen (hosp performed)not at [facility]     Status: Abnormal   Collection Time: 02/03/15 12:31 AM  Result Value Ref Range   Opiates NONE DETECTED NONE DETECTED   Cocaine NONE DETECTED NONE DETECTED   Benzodiazepines NONE DETECTED NONE DETECTED   Amphetamines NONE DETECTED NONE DETECTED   Tetrahydrocannabinol POSITIVE (A) NONE DETECTED   Barbiturates NONE DETECTED NONE DETECTED    Comment:        DRUG SCREEN FOR MEDICAL PURPOSES ONLY.  IF CONFIRMATION IS NEEDED FOR ANY PURPOSE, NOTIFY LAB WITHIN 5 DAYS.        LOWEST DETECTABLE LIMITS FOR URINE DRUG SCREEN Drug Class       Cutoff (ng/mL) Amphetamine      1000 Barbiturate      200 Benzodiazepine   151 Tricyclics       761 Opiates          300 Cocaine          300 THC              50   CBC     Status: Abnormal   Collection Time: 02/03/15 12:41 AM  Result Value Ref Range   WBC 4.9 4.0 - 10.5 K/uL   RBC 5.42 4.22 - 5.81 MIL/uL   Hemoglobin  17.9 (H) 13.0 - 17.0 g/dL   HCT 50.8 39.0 - 52.0 %   MCV 93.7 78.0 - 100.0 fL   MCH 33.0 26.0 - 34.0 pg   MCHC 35.2 30.0 - 36.0 g/dL   RDW 13.9 11.5 - 15.5 %   Platelets 206 150 - 400 K/uL  Comprehensive metabolic panel     Status: Abnormal   Collection Time: 02/03/15 12:41 AM  Result Value Ref Range   Sodium 134 (L) 135 - 145 mmol/L   Potassium 3.7 3.5 - 5.1 mmol/L   Chloride 82 (L) 101 - 111 mmol/L   CO2 24 22 - 32 mmol/L   Glucose, Bld 95 65 - 99 mg/dL   BUN 12 6 - 20 mg/dL   Creatinine, Ser 0.90 0.61 - 1.24 mg/dL  Calcium 9.4 8.9 - 10.3 mg/dL   Total Protein 8.5 (H) 6.5 - 8.1 g/dL   Albumin 5.3 (H) 3.5 - 5.0 g/dL   AST 214 (H) 15 - 41 U/L   ALT 181 (H) 17 - 63 U/L   Alkaline Phosphatase 95 38 - 126 U/L   Total Bilirubin 1.1 0.3 - 1.2 mg/dL   GFR calc non Af Amer >60 >60 mL/min   GFR calc Af Amer >60 >60 mL/min    Comment: (NOTE) The eGFR has been calculated using the CKD EPI equation. This calculation has not been validated in all clinical situations. eGFR's persistently <60 mL/min signify possible Chronic Kidney Disease.    Anion gap 28 (H) 5 - 15  Ethanol (ETOH)     Status: Abnormal   Collection Time: 02/03/15 12:41 AM  Result Value Ref Range   Alcohol, Ethyl (B) 194 (H) <5 mg/dL    Comment:        LOWEST DETECTABLE LIMIT FOR SERUM ALCOHOL IS 5 mg/dL FOR MEDICAL PURPOSES ONLY   Acetaminophen level     Status: Abnormal   Collection Time: 02/03/15 12:41 AM  Result Value Ref Range   Acetaminophen (Tylenol), Serum <10 (L) 10 - 30 ug/mL    Comment:        THERAPEUTIC CONCENTRATIONS VARY SIGNIFICANTLY. A RANGE OF 10-30 ug/mL MAY BE AN EFFECTIVE CONCENTRATION FOR MANY PATIENTS. HOWEVER, SOME ARE BEST TREATED AT CONCENTRATIONS OUTSIDE THIS RANGE. ACETAMINOPHEN CONCENTRATIONS >150 ug/mL AT 4 HOURS AFTER INGESTION AND >50 ug/mL AT 12 HOURS AFTER INGESTION ARE OFTEN ASSOCIATED WITH TOXIC REACTIONS.   Salicylate level     Status: None   Collection Time:  02/03/15 12:41 AM  Result Value Ref Range   Salicylate Lvl <1.0 2.8 - 30.0 mg/dL  Comprehensive metabolic panel     Status: Abnormal   Collection Time: 02/03/15  8:20 AM  Result Value Ref Range   Sodium 132 (L) 135 - 145 mmol/L   Potassium 3.4 (L) 3.5 - 5.1 mmol/L   Chloride 89 (L) 101 - 111 mmol/L   CO2 29 22 - 32 mmol/L   Glucose, Bld 100 (H) 65 - 99 mg/dL   BUN 11 6 - 20 mg/dL   Creatinine, Ser 0.72 0.61 - 1.24 mg/dL   Calcium 8.4 (L) 8.9 - 10.3 mg/dL   Total Protein 6.5 6.5 - 8.1 g/dL   Albumin 4.1 3.5 - 5.0 g/dL   AST 144 (H) 15 - 41 U/L   ALT 130 (H) 17 - 63 U/L   Alkaline Phosphatase 70 38 - 126 U/L   Total Bilirubin 0.8 0.3 - 1.2 mg/dL   GFR calc non Af Amer >60 >60 mL/min   GFR calc Af Amer >60 >60 mL/min    Comment: (NOTE) The eGFR has been calculated using the CKD EPI equation. This calculation has not been validated in all clinical situations. eGFR's persistently <60 mL/min signify possible Chronic Kidney Disease.    Anion gap 14 5 - 15    Observation Level/Precautions:  15 minute checks  Laboratory:  As per the ED  Psychotherapy:  Individual/group  Medications:  Ativan detox, Restart Zoloft 50 mg daily for depression  Consultations:  As needed  Discharge Concerns:  Continued alcohol abuse  Estimated LOS: 3-5 days  Other:     Psychological Evaluations: No   Treatment Plan Summary: Daily contact with patient to assess and evaluate symptoms and progress in treatment and Medication management Supportive approach/coping skills Alcohol dependence; detox with Ativan /  work a relapse prevention plan Psychosis; will assess for need to resume Geodon 40 mg BID Depression; resume the Zoloft 50 mg daily Insomnia; will use Ambien 10 mg HS PRN sleep Back/neck pain; resume the Neurontin 400 mg QID Will reassess for a residential treatment program  Will monitor labs for abnormalities in chemistry profile likely related to alcohol abuse and poor oral intake over the last  month.  Medical Decision Making:  Review of Psycho-Social Stressors (1), Review or order clinical lab tests (1), Established Problem, Worsening (2), Review of Medication Regimen & Side Effects (2) and Review of New Medication or Change in Dosage (2)  I certify that inpatient services furnished can reasonably be expected to improve the patient's condition.   Elmarie Shiley NP-C 6/18/20161:58 PM   Case reviewed with NP and patient seen by me Agree with NP Assessment and Plan 44 year old Divorced male, Lives alone, on disability, who has a history of Schizoaffective Disorder, PTSD, and Alcohol Dependence. He has been feeling increasingly depressed and has also been drinking heavily over recent weeks, up to one case of beer per day. Last drank yesterday. He had stopped his psychiatric medications A few days ago. He describes auditory hallucinations " berating me" ( at this time denies command hallucinations) Due to worsening depression, with passive SI thoughts ( " I want to go to sleep and not wake up")" decided to come to hospital.  Of note, has a history of alcohol withdrawal seizures in the past . Dx- Schizoaffective Disorder, Depressed, and Alcohol Dependence  Plan- Admit to inpatient unit, Start Ativan detox , Restarted on Neurontin and Zoloft , will also restart Geodon, which he had been on without side effects, but as he has been off this medication for several days , will restart at lower dose, to minimize risk of side effects. Will obtain EKG to rule out QTc prolongation.

## 2015-02-03 NOTE — Progress Notes (Signed)
D: Pt has depressed affect and mood.  Pt has slept and stayed in his room for the majority of the shift so far.  He reports he is "just sweaty, shaky, depressed.  I'm just depressed and I want to die but I don't have a plan or anything."  Pt reports auditory hallucinations of voices saying "piece of shit, basically stuff like that."  Pt denies HI, denies visual hallucinations.  He reported pain from a headache of 6/10.  Pt is very unsteady on his feet and is a high fall risk.  Pt did not attend evening group.    A: Introduced self to pt.  Met with pt 1:1 and provided support and encouragement.  Actively listened to pt.  Encouraged pt to do EKG with writer, pt refused.  Reviewed ways to prevent falls with pt, pt verbalized understanding.  Medications administered per order.  PRN medication administered for pain from headache.   R: Pt is compliant with medications.  Pt verbally contracts for safety.  Will continue to monitor and assess.   

## 2015-02-03 NOTE — Progress Notes (Signed)
Addendum : Pt has been sleeping on and off, slight hand tremors noted. Compliant with medications encouraged to eat, pt refused drank some fluids. Maintained fall precautions.

## 2015-02-03 NOTE — ED Provider Notes (Signed)
CSN: [id number]     Arrival date & time 02/02/15  2356 History   First MD Initiated Contact with Patient 02/03/15 0111     Chief Complaint  Patient presents with  . Alcohol Intoxication  . Suicidal    (Consider location/radiation/quality/duration/timing/severity/associated sxs/prior Treatment) HPI Comments: 44 year old male with a history of psychosis, PTSD, seizure disorder from alcohol withdrawal, and schizoaffective disorder presents to the emergency department for further evaluation of depression and alcohol withdrawals. Patient states that he drinks approximately one case of beer per day. He states that he is feeling shaky at present as his last drink was 24 hours ago. Patient denies any illicit drug use other than the occasional use of marijuana. He has had no seizure activity while in the emergency department. Patient reports that he feels depressed. He has been off of his daily medications for the past few days. Patient states that he feels "like not wanting to live anymore". He denies any suicidal intent or plan. No homicidal ideations. Patient is requesting a sandwich.  Patient is a 44 y.o. male presenting with intoxication. The history is provided by the patient. No language interpreter was used.  Alcohol Intoxication    Past Medical History  Diagnosis Date  . Psychosis   . PTSD (post-traumatic stress disorder)   . Seizure disorder     related to etoh seizure  . Alcohol abuse   . Schizoaffective disorder   . Anxiety     at age 59  . Depression     at age 88   Past Surgical History  Procedure Laterality Date  . Foot surgery      right  . Hand surgery     Family History  Problem Relation Age of Onset  . Alcoholism Mother   . CAD Father   . Depression Brother    History  Substance Use Topics  . Smoking status: Current Every Day Smoker -- 1.50 packs/day for 24 years    Types: Cigarettes  . Smokeless tobacco: Never Used  . Alcohol Use: Yes     Comment: "I'm a  binge drinker.[name] 4-40 ounces/day"    Review of Systems  Neurological: Positive for tremors.  Psychiatric/Behavioral: Positive for behavioral problems.  All other systems reviewed and are negative.   Allergies  Wellbutrin  Home Medications   Prior to Admission medications   Medication Sig Start Date End Date Taking? Authorizing Provider  gabapentin (NEURONTIN) 400 MG capsule Take 1 tab (400 mg) three times during the day.  Then take 1 tab (400 mg) at bedtime. Patient taking differently: Take 400 mg by mouth 4 (four) times daily. Take 1 tab (400 mg) three times during the day.  Then take 1 tab (400 mg) at bedtime. 12/30/14  Yes Kerrie Buffalo, NP  sertraline (ZOLOFT) 50 MG tablet Take 1 tablet (50 mg total) by mouth daily. 12/30/14  Yes Kerrie Buffalo, NP  traZODone (DESYREL) 50 MG tablet Take 1 tablet (50 mg total) by mouth at bedtime and may repeat dose one time if needed. Patient taking differently: Take 50 mg by mouth at bedtime as needed for sleep.  12/30/14  Yes Kerrie Buffalo, NP  ziprasidone (GEODON) 80 MG capsule Take 1 capsule (80 mg total) by mouth 2 (two) times daily with a meal. 12/30/14  Yes Kerrie Buffalo, NP  nicotine (NICODERM CQ - DOSED IN MG/24 HOURS) 21 mg/24hr patch Place 1 patch (21 mg total) onto the skin daily. Patient not taking: Reported on 02/03/2015 12/30/14   Freda Munro  Agustin, NP   BP 125/73 mmHg  Pulse 111  Temp(Src) 98.1 F (36.7 C) (Oral)  Resp 21  SpO2 96%   Physical Exam  Constitutional: He is oriented to person, place, and time. He appears well-developed and well-nourished. No distress.  Nontoxic/nonseptic appearing  HENT:  Head: Normocephalic and atraumatic.  Eyes: Conjunctivae and EOM are normal. No scleral icterus.  Neck: Normal range of motion.  Pulmonary/Chest: Effort normal. No respiratory distress.  Musculoskeletal: Normal range of motion.  Neurological: He is alert and oriented to person, place, and time. He exhibits normal muscle tone.  Coordination normal.  GCS 15. Speech is goal oriented. No tremors appreciated. Patient sleeping comfortably during my encounter.  Skin: Skin is warm and dry. No rash noted. He is not diaphoretic. No erythema. No pallor.  Psychiatric: His speech is normal. He is withdrawn. He exhibits a depressed mood. He expresses no homicidal and no suicidal ideation. He expresses no suicidal plans and no homicidal plans.  Nursing note and vitals reviewed.   ED Course  Procedures (including critical care time) Labs Review Labs Reviewed  CBC - Abnormal; Notable for the following:    Hemoglobin 17.9 (*)    All other components within normal limits  COMPREHENSIVE METABOLIC PANEL - Abnormal; Notable for the following:    Sodium 134 (*)    Chloride 82 (*)    Total Protein 8.5 (*)    Albumin 5.3 (*)    AST 214 (*)    ALT 181 (*)    Anion gap 28 (*)    All other components within normal limits  ETHANOL - Abnormal; Notable for the following:    Alcohol, Ethyl (B) 194 (*)    All other components within normal limits  ACETAMINOPHEN LEVEL - Abnormal; Notable for the following:    Acetaminophen (Tylenol), Serum <10 (*)    All other components within normal limits  URINE RAPID DRUG SCREEN, HOSP PERFORMED - Abnormal; Notable for the following:    Tetrahydrocannabinol POSITIVE (*)    All other components within normal limits  SALICYLATE LEVEL    Imaging Review No results found.   EKG Interpretation None      MDM   Final diagnoses:  Alcohol use disorder, severe, dependence  Schizoaffective disorder, depressive type  Hypochloremia    44 year old male presents to the emergency department for psychiatric evaluation for alcohol use disorder and dependence as well as worsening depression secondary to his schizoaffective disorder. Patient has been noncompliant with his medications over the last few days. His laboratory workup reveals a hypochloremia with no other electrolyte abnormalities. This is  likely the result of patient's elevated anion gap. Alcohol level is 194. See well protocol orders placed.  Patient has been accepted at [facility] for further treatment; however, he cannot be medically cleared at this time given his hypochloremia and elevated anion gap. Patient ordered to be given a banana bag with IV fluids. Plan to recheck labs upon completion of these orders. Patient may be discharged to behavioral health once medically cleared; accepting physician, Dr. Sabra Heck. Patient signed out to oncoming midlevel provider at shift change who will follow.   Filed Vitals:   02/03/15 0345 02/03/15 0400 02/03/15 0415 02/03/15 0430  BP:  125/68  125/73  Pulse:  100 98 111  Temp:      TempSrc:      Resp:  18 22 21   SpO2: 94% 94% 95% 96%     Antonietta Breach, PA-C 02/03/15 0515  Serita Grit, MD  02/05/15 0439 

## 2015-02-03 NOTE — Tx Team (Signed)
Initial Interdisciplinary Treatment Plan   PATIENT STRESSORS: Substance abuse Traumatic event   PATIENT STRENGTHS: Ability for insight Average or above average intelligence Capable of independent living   PROBLEM LIST: Problem List/Patient Goals Date to be addressed Date deferred Reason deferred Estimated date of resolution  Risk for self Harm 02/03/2015     Etoh abuse 02/03/2015     Alteration in thought 02/03/2015                                          DISCHARGE CRITERIA:  Ability to meet basic life and health needs Adequate post-discharge living arrangements Improved stabilization in mood, thinking, and/or behavior  PRELIMINARY DISCHARGE PLAN: Placement in alternative living arrangements  PATIENT/FAMIILY INVOLVEMENT: This treatment plan has been presented to and reviewed with the patient, Adir Schicker, and/or family member, .  The patient and family have been given the opportunity to ask questions and make suggestions.  Jaynie Bream 02/03/2015, 6:58 PM

## 2015-02-03 NOTE — BH Assessment (Signed)
Reviewed ED notes prior to assessment. Pt with SI and requesting help with alcohol withdrawal. Pt has had at least three past [facility] admissions for similar concerns, with the last being May 2016.  Assessment to commence shortly.   Lear Ng, [facility] Triage Specialist 02/03/2015 1:22 AM

## 2015-02-03 NOTE — Progress Notes (Signed)
Patient did not attend the evening speaker [place] meeting. Attempted to notify patient that group was beginning but pt remained asleep. Respirations even and unlabored.
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
## 2015-02-03 NOTE — Progress Notes (Signed)
Nursing Admit Note 44 y/o male admitted from [place], with c/o drinking a case of beer a day. Pt has had numerous admits to [facility]. Last one in 12/26/14 Pt has been non complaint with his medications was taking Geodon and Neurontin. Pt lives alone in apt and has history of seizures possiably having one yesterday. C/o hearing voices to harm self has verbally contracted for safety. Appearance is unkempt, needs a shower. Mood is depressed and anxious. CIWA 9.          Oriented to the unit, Education provided about safety on the unit, including fall prevention, pt placed on high due to hx of seizures. Nutrition offered, safety checks initiated every 15 minutes. Search completed.

## 2015-02-03 NOTE — ED Provider Notes (Signed)
Patient received in sign out from PA [place] at shift change.  Briefly, 44 y.o. M here with depression and alcohol abuse.  He is currently intoxicated.  He reports drinking a case of beer daily.  Occasional THC use.  He expresses SI without active plan.  No HI or AVH.  Lab work thus far is reassuring-- ethanol 194, electrolyte imbalance with increased anion gap of 28. UDS positive for THC. Patient denies intake of any toxic substances.  Patient has been accepted to [facility] under care of Dr. Sabra Heck pending correction of his electrolytes and elevated anion gap.   Plan:  Banana bag and 1 L saline bolus infusing currently. We'll recheck CMP afterwards. If improved and anion gap back to normal range, may discharge to begin treatment [facility].  Results for orders placed or performed during the hospital encounter of 02/02/15  CBC  Result Value Ref Range   WBC 4.9 4.0 - 10.5 K/uL   RBC 5.42 4.22 - 5.81 MIL/uL   Hemoglobin 17.9 (H) 13.0 - 17.0 g/dL   HCT 50.8 39.0 - 52.0 %   MCV 93.7 78.0 - 100.0 fL   MCH 33.0 26.0 - 34.0 pg   MCHC 35.2 30.0 - 36.0 g/dL   RDW 13.9 11.5 - 15.5 %   Platelets 206 150 - 400 K/uL  Comprehensive metabolic panel  Result Value Ref Range   Sodium 134 (L) 135 - 145 mmol/L   Potassium 3.7 3.5 - 5.1 mmol/L   Chloride 82 (L) 101 - 111 mmol/L   CO2 24 22 - 32 mmol/L   Glucose, Bld 95 65 - 99 mg/dL   BUN 12 6 - 20 mg/dL   Creatinine, Ser 0.90 0.61 - 1.24 mg/dL   Calcium 9.4 8.9 - 10.3 mg/dL   Total Protein 8.5 (H) 6.5 - 8.1 g/dL   Albumin 5.3 (H) 3.5 - 5.0 g/dL   AST 214 (H) 15 - 41 U/L   ALT 181 (H) 17 - 63 U/L   Alkaline Phosphatase 95 38 - 126 U/L   Total Bilirubin 1.1 0.3 - 1.2 mg/dL   GFR calc non Af Amer >60 >60 mL/min   GFR calc Af Amer >60 >60 mL/min   Anion gap 28 (H) 5 - 15  Ethanol (ETOH)  Result Value Ref Range   Alcohol, Ethyl (B) 194 (H) <5 mg/dL  Acetaminophen level  Result Value Ref Range   Acetaminophen (Tylenol), Serum <10 (L) 10 - 30 ug/mL  Salicylate level   Result Value Ref Range   Salicylate Lvl <8.1 2.8 - 30.0 mg/dL  Urine rapid drug screen (hosp performed)not at [facility]  Result Value Ref Range   Opiates NONE DETECTED NONE DETECTED   Cocaine NONE DETECTED NONE DETECTED   Benzodiazepines NONE DETECTED NONE DETECTED   Amphetamines NONE DETECTED NONE DETECTED   Tetrahydrocannabinol POSITIVE (A) NONE DETECTED   Barbiturates NONE DETECTED NONE DETECTED  Comprehensive metabolic panel  Result Value Ref Range   Sodium 132 (L) 135 - 145 mmol/L   Potassium 3.4 (L) 3.5 - 5.1 mmol/L   Chloride 89 (L) 101 - 111 mmol/L   CO2 29 22 - 32 mmol/L   Glucose, Bld 100 (H) 65 - 99 mg/dL   BUN 11 6 - 20 mg/dL   Creatinine, Ser 0.72 0.61 - 1.24 mg/dL   Calcium 8.4 (L) 8.9 - 10.3 mg/dL   Total Protein 6.5 6.5 - 8.1 g/dL   Albumin 4.1 3.5 - 5.0 g/dL   AST 144 (H) 15 -  41 U/L   ALT 130 (H) 17 - 63 U/L   Alkaline Phosphatase 70 38 - 126 U/L   Total Bilirubin 0.8 0.3 - 1.2 mg/dL   GFR calc non Af Amer >60 >60 mL/min   GFR calc Af Amer >60 >60 mL/min   Anion gap 14 5 - 15   No results found.  After banana bag and liter of saline, CMP has not returned to completely normal, however much improved from prior-- anion gap now within normal range of 14.  Report called to [facility].    10:53 AM Transport here to take patient to [facility].  Larene Pickett, PA-C 02/03/15 1058  Milton Ferguson, MD 02/06/15 1415

## 2015-02-04 DIAGNOSIS — F1024 Alcohol dependence with alcohol-induced mood disorder: Secondary | ICD-10-CM

## 2015-02-04 DIAGNOSIS — F10230 Alcohol dependence with withdrawal, uncomplicated: Secondary | ICD-10-CM

## 2015-02-04 MED ORDER — ZIPRASIDONE HCL 40 MG PO CAPS
40.0000 mg | ORAL_CAPSULE | Freq: Two times a day (BID) | ORAL | Status: DC
  Administered 2015-02-04 – 2015-02-05 (×2): 40 mg via ORAL
  Filled 2015-02-04 (×7): qty 1

## 2015-02-04 NOTE — Progress Notes (Signed)
Patient did not attend the evening speaker AA meeting. Pt was notified that group was beginning but remained in bed.   

## 2015-02-04 NOTE — BHH Group Notes (Signed)
The focus of this group is to educate the patient on the purpose and policies of crisis stabilization and provide a format to answer questions about their admission.  The group details unit policies and expectations of patients while admitted.  Patient attended 0900 nurse education orientation group this morning.  Patient actively participated, appropriate affect, alert.  Patient has appropriate insight and engagement.  Today patient will work on 3 goals for discharge.

## 2015-02-04 NOTE — Plan of Care (Signed)
Problem: Alteration in mood & ability to function due to Goal: LTG-Pt reports reduction in suicidal thoughts (Patient reports reduction in suicidal thoughts and is able to verbalize a safety plan for whenever patient is feeling suicidal)  Outcome: Not Progressing Pt continues to report SI without a plan.  He verbally contracted for safety with Probation officer.

## 2015-02-04 NOTE — BHH Group Notes (Signed)
[facility] [facility] Group Notes:  (Clinical Social Work)  02/04/2015  10:00-11:00AM  Summary of Progress/Problems:   The main focus of today's process group was to   1)  discuss the importance of adding supports  2)  identify the patient's current healthy supports  3)  discuss reasons it is so difficult to utilize supports and  4)  begin to plan what supports to add.  An emphasis was placed on changing at least one part of what was in place prior to hospitalization by using counselor, doctor, therapy groups, 12-step groups, a sponsor, and support groups.  The patient said the change he is going to make involves living with his mother and asking her to help with his money.  He feels very bad about the need to do this because "I'm a grown man" but acknowledged that he has done better with that level of support before, and feels he has no choice if he is going to change the outcome.  He said repeatedly he is "tired of coming to places like this."  Group provided him encouragement  Type of Therapy:  Process Group with Motivational Interviewing  Participation Level:  Active  Participation Quality:  Attentive  Affect:  Anxious, Depressed and Flat  Cognitive:  Oriented  Insight:  Engaged  Engagement in Therapy:  Engaged  Modes of Intervention:   Education, Support and Processing, Activity  Selmer Dominion, LCSW 02/04/2015
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
## 2015-02-04 NOTE — Progress Notes (Signed)
[facility] MD Progress Note  02/04/2015 3:31 PM [name]  MRN:  [id number]  Subjective: [name] reports, "I'm not doing well, bad alcohol withdrawal"  Objectives: [name] is seen, chart reviewed. He is visible on the unit. He is participating in the the group milieu. [name] is endorsing bad substance withdrawal symptoms. He says he is hearing voices telling him that he is a person, to kill himself. Some of the voices are telling him that there is no more hope for him. His antipsychotic medications has been resumed. Increased Geodon from 20 mg to 40 mg bid. He is tolerating his treatment regimen without side effects.  Principal Problem: <principal problem not specified>  Diagnosis:   Patient Active Problem List   Diagnosis Date Noted  . Alcohol dependence with alcohol-induced mood disorder [F10.24]   . Alcohol withdrawal [F10.239] 12/25/2014  . Schizoaffective disorder, depressive type [F25.1]   . Alcohol use disorder, severe, dependence [F10.20] 11/29/2014  . Current smoker [Z72.0] 11/20/2014  . Tremors [G25.2] 11/03/2014  . Chronic posttraumatic stress disorder [F43.12]   . Protein-calorie malnutrition, severe [E43] 10/19/2014  . Hallucinations, visual [R44.1]   . Noncompliance with medication regimen [Z91.14]   . Hallucinations [R44.3] 07/07/2014  . Paranoia [F22] 07/07/2014  . Cannabis abuse [F12.10] 12/30/2013  . Schizoaffective disorder [F25.9] 12/29/2013  . Post traumatic stress disorder (PTSD) [F43.10] 11/03/2011   Total Time spent with patient: 1 hour Past Medical History:  Past Medical History  Diagnosis Date  . Psychosis   . PTSD (post-traumatic stress disorder)   . Seizure disorder     related to etoh seizure  . Alcohol abuse   . Schizoaffective disorder   . Anxiety     at age 61  . Depression     at age 10    Past Surgical History  Procedure Laterality Date  . Foot surgery      right  . Hand surgery     Family History:  Family History  Problem Relation  Age of Onset  . Alcoholism Mother   . CAD Father   . Depression Brother    Social History:  History  Alcohol Use  . Yes    Comment: "I'm a binge drinker.[name] 4-40 ounces/day"     History  Drug Use  . 1.00 per week  . Special: Marijuana    Comment: THC 2 to 3 times per month    History   Social History  . Marital Status: Divorced    Spouse Name: N/A  . Number of Children: N/A  . Years of Education: N/A   Social History Main Topics  . Smoking status: Current Every Day Smoker -- 1.50 packs/day for 24 years    Types: Cigarettes  . Smokeless tobacco: Never Used  . Alcohol Use: Yes     Comment: "I'm a binge drinker.[name] 4-40 ounces/day"  . Drug Use: 1.00 per week    Special: Marijuana     Comment: THC 2 to 3 times per month  . Sexual Activity: Yes   Other Topics Concern  . None   Social History Narrative   Additional History:    Sleep: Fair  Appetite:  Fair  Musculoskeletal: Strength & Muscle Tone: within normal limits Gait & Station: normal Patient leans: N/A  Psychiatric Specialty Exam: Physical Exam  Review of Systems  Constitutional: Positive for chills, malaise/fatigue and diaphoresis.  HENT: Negative.   Eyes: Negative.   Respiratory: Negative.   Cardiovascular: Negative.   Gastrointestinal: Negative.   Genitourinary: Negative.  Musculoskeletal: Negative.   Skin: Negative.   Neurological: Positive for dizziness and weakness.  Endo/Heme/Allergies: Negative.   Psychiatric/Behavioral: Positive for depression and substance abuse (Alcoholism). Negative for suicidal ideas, hallucinations and memory loss. The patient is nervous/anxious and has insomnia.     Blood pressure 140/93, pulse 85, temperature 98.6 F (37 C), temperature source Oral, resp. rate 24, height 6' (1.829 m), weight 92.08 kg (203 lb).Body mass index is 27.53 kg/(m^2).  General Appearance: Casual  Eye Contact::  Good  Speech:  Clear and Coherent  Volume:  Normal  Mood:  Anxious and  Depressed  Affect:  Flat  Thought Process:  Coherent  Orientation:  Full (Time, Place, and Person)  Thought Content:  Rumination  Suicidal Thoughts:  Fleting thoughts, no plans  Homicidal Thoughts:  No  Memory:  Grossly intact  Judgement:  Fair  Insight:  Present  Psychomotor Activity:  Restlessness and Tremor  Concentration:  Fair  Recall:  Good  Fund of Knowledge:Fair  Language: Good  Akathisia:  No  Handed:  Right  AIMS (if indicated):     Assets:  Communication Skills Desire for Improvement  ADL's:  Intact  Cognition: WNL  Sleep:      Current Medications: Current Facility-Administered Medications  Medication Dose Route Frequency Provider Last Rate Last Dose  . acetaminophen (TYLENOL) tablet 650 mg  650 mg Oral Q6H PRN Encarnacion Slates, NP   650 mg at 02/04/15 1318  . alum & mag hydroxide-simeth (MAALOX/MYLANTA) 200-200-20 MG/5ML suspension 30 mL  30 mL Oral Q4H PRN Encarnacion Slates, NP      . gabapentin (NEURONTIN) capsule 400 mg  400 mg Oral QID Encarnacion Slates, NP   400 mg at 02/04/15 1207  . hydrOXYzine (ATARAX/VISTARIL) tablet 25 mg  25 mg Oral Q6H PRN Encarnacion Slates, NP      . loperamide (IMODIUM) capsule 2-4 mg  2-4 mg Oral PRN Encarnacion Slates, NP   4 mg at 02/04/15 1506  . LORazepam (ATIVAN) tablet 1 mg  1 mg Oral Q6H PRN Encarnacion Slates, NP   1 mg at 02/04/15 1318  . LORazepam (ATIVAN) tablet 1 mg  1 mg Oral TID Encarnacion Slates, NP   1 mg at 02/04/15 1207   Followed by  . [START ON 02/05/2015] LORazepam (ATIVAN) tablet 1 mg  1 mg Oral BID Encarnacion Slates, NP       Followed by  . [START ON 02/07/2015] LORazepam (ATIVAN) tablet 1 mg  1 mg Oral Daily Encarnacion Slates, NP      . magnesium hydroxide (MILK OF MAGNESIA) suspension 30 mL  30 mL Oral Daily PRN Encarnacion Slates, NP      . multivitamin with minerals tablet 1 tablet  1 tablet Oral Daily Encarnacion Slates, NP   1 tablet at 02/04/15 0827  . nicotine (NICODERM CQ - dosed in mg/24 hours) patch 21 mg  21 mg Transdermal Daily Encarnacion Slates, NP    21 mg at 02/03/15 1330  . ondansetron (ZOFRAN-ODT) disintegrating tablet 4 mg  4 mg Oral Q6H PRN Encarnacion Slates, NP      . sertraline (ZOLOFT) tablet 50 mg  50 mg Oral Daily Encarnacion Slates, NP   50 mg at 02/04/15 0827  . thiamine (VITAMIN B-1) tablet 100 mg  100 mg Oral Daily Encarnacion Slates, NP   100 mg at 02/04/15 0827  . traZODone (DESYREL) tablet 50 mg  50 mg Oral  QHS,MR X 1 Encarnacion Slates, NP   50 mg at 02/03/15 2141  . ziprasidone (GEODON) capsule 20 mg  20 mg Oral BID WC Jenne Campus, MD   20 mg at 02/04/15 0827    Lab Results:  Results for orders placed or performed during the hospital encounter of 02/02/15 (from the past 48 hour(s))  Urine rapid drug screen (hosp performed)not at [facility]     Status: Abnormal   Collection Time: 02/03/15 12:31 AM  Result Value Ref Range   Opiates NONE DETECTED NONE DETECTED   Cocaine NONE DETECTED NONE DETECTED   Benzodiazepines NONE DETECTED NONE DETECTED   Amphetamines NONE DETECTED NONE DETECTED   Tetrahydrocannabinol POSITIVE (A) NONE DETECTED   Barbiturates NONE DETECTED NONE DETECTED    Comment:        DRUG SCREEN FOR MEDICAL PURPOSES ONLY.  IF CONFIRMATION IS NEEDED FOR ANY PURPOSE, NOTIFY LAB WITHIN 5 DAYS.        LOWEST DETECTABLE LIMITS FOR URINE DRUG SCREEN Drug Class       Cutoff (ng/mL) Amphetamine      1000 Barbiturate      200 Benzodiazepine   333 Tricyclics       545 Opiates          300 Cocaine          300 THC              50   CBC     Status: Abnormal   Collection Time: 02/03/15 12:41 AM  Result Value Ref Range   WBC 4.9 4.0 - 10.5 K/uL   RBC 5.42 4.22 - 5.81 MIL/uL   Hemoglobin 17.9 (H) 13.0 - 17.0 g/dL   HCT 50.8 39.0 - 52.0 %   MCV 93.7 78.0 - 100.0 fL   MCH 33.0 26.0 - 34.0 pg   MCHC 35.2 30.0 - 36.0 g/dL   RDW 13.9 11.5 - 15.5 %   Platelets 206 150 - 400 K/uL  Comprehensive metabolic panel     Status: Abnormal   Collection Time: 02/03/15 12:41 AM  Result Value Ref Range   Sodium 134 (L) 135 - 145 mmol/L    Potassium 3.7 3.5 - 5.1 mmol/L   Chloride 82 (L) 101 - 111 mmol/L   CO2 24 22 - 32 mmol/L   Glucose, Bld 95 65 - 99 mg/dL   BUN 12 6 - 20 mg/dL   Creatinine, Ser 0.90 0.61 - 1.24 mg/dL   Calcium 9.4 8.9 - 10.3 mg/dL   Total Protein 8.5 (H) 6.5 - 8.1 g/dL   Albumin 5.3 (H) 3.5 - 5.0 g/dL   AST 214 (H) 15 - 41 U/L   ALT 181 (H) 17 - 63 U/L   Alkaline Phosphatase 95 38 - 126 U/L   Total Bilirubin 1.1 0.3 - 1.2 mg/dL   GFR calc non Af Amer >60 >60 mL/min   GFR calc Af Amer >60 >60 mL/min    Comment: (NOTE) The eGFR has been calculated using the CKD EPI equation. This calculation has not been validated in all clinical situations. eGFR's persistently <60 mL/min signify possible Chronic Kidney Disease.    Anion gap 28 (H) 5 - 15  Ethanol (ETOH)     Status: Abnormal   Collection Time: 02/03/15 12:41 AM  Result Value Ref Range   Alcohol, Ethyl (B) 194 (H) <5 mg/dL    Comment:        LOWEST DETECTABLE LIMIT FOR SERUM ALCOHOL IS 5 mg/dL  FOR MEDICAL PURPOSES ONLY   Acetaminophen level     Status: Abnormal   Collection Time: 02/03/15 12:41 AM  Result Value Ref Range   Acetaminophen (Tylenol), Serum <10 (L) 10 - 30 ug/mL    Comment:        THERAPEUTIC CONCENTRATIONS VARY SIGNIFICANTLY. A RANGE OF 10-30 ug/mL MAY BE AN EFFECTIVE CONCENTRATION FOR MANY PATIENTS. HOWEVER, SOME ARE BEST TREATED AT CONCENTRATIONS OUTSIDE THIS RANGE. ACETAMINOPHEN CONCENTRATIONS >150 ug/mL AT 4 HOURS AFTER INGESTION AND >50 ug/mL AT 12 HOURS AFTER INGESTION ARE OFTEN ASSOCIATED WITH TOXIC REACTIONS.   Salicylate level     Status: None   Collection Time: 02/03/15 12:41 AM  Result Value Ref Range   Salicylate Lvl <9.9 2.8 - 30.0 mg/dL  Comprehensive metabolic panel     Status: Abnormal   Collection Time: 02/03/15  8:20 AM  Result Value Ref Range   Sodium 132 (L) 135 - 145 mmol/L   Potassium 3.4 (L) 3.5 - 5.1 mmol/L   Chloride 89 (L) 101 - 111 mmol/L   CO2 29 22 - 32 mmol/L   Glucose, Bld 100  (H) 65 - 99 mg/dL   BUN 11 6 - 20 mg/dL   Creatinine, Ser 0.72 0.61 - 1.24 mg/dL   Calcium 8.4 (L) 8.9 - 10.3 mg/dL   Total Protein 6.5 6.5 - 8.1 g/dL   Albumin 4.1 3.5 - 5.0 g/dL   AST 144 (H) 15 - 41 U/L   ALT 130 (H) 17 - 63 U/L   Alkaline Phosphatase 70 38 - 126 U/L   Total Bilirubin 0.8 0.3 - 1.2 mg/dL   GFR calc non Af Amer >60 >60 mL/min   GFR calc Af Amer >60 >60 mL/min    Comment: (NOTE) The eGFR has been calculated using the CKD EPI equation. This calculation has not been validated in all clinical situations. eGFR's persistently <60 mL/min signify possible Chronic Kidney Disease.    Anion gap 14 5 - 15    Physical Findings: AIMS: Facial and Oral Movements Muscles of Facial Expression: None, normal Lips and Perioral Area: None, normal Jaw: None, normal Tongue: None, normal,Extremity Movements Upper (arms, wrists, hands, fingers): None, normal Lower (legs, knees, ankles, toes): None, normal, Trunk Movements Neck, shoulders, hips: None, normal, Overall Severity Severity of abnormal movements (highest score from questions above): None, normal Incapacitation due to abnormal movements: None, normal Patient's awareness of abnormal movements (rate only patient's report): No Awareness, Dental Status Current problems with teeth and/or dentures?: No Does patient usually wear dentures?: No  CIWA:  CIWA-Ar Total: 5 COWS:  COWS Total Score: 6  Treatment Plan Summary: Daily contact with patient to assess and evaluate symptoms and progress in treatment and Medication management:  Plan: Supportive approach/coping skills/relapse prevention           Depression: Continue Sertraline 50 mg and continue to work with mindfulness, CBT help  identify the cognitive distortions that keep the depression going          Discuss other life style changes that can help with both his depression and his alcohol use such like exercise, meditation           Alcohol Dependence: will continue the detox  protocol, continue Gabapentin 400 mg for agitation, will use motivational interviewing and encourage to pursue total abstinence          Mood control; Increased Geodon 40 mg & Trazodone for insomnia           Continue to  educate in terms of AA and other recovery strategies  Medical Decision Making:  Established Problem, Stable/Improving (1), Review of Psycho-Social Stressors (1), Review of Last Therapy Session (1), Review of Medication Regimen & Side Effects (2) and Review of New Medication or Change in Dosage (2)  Encarnacion Slates, PMHNP, FNP-BC 02/04/2015, 3:31 PM Agree with NP Progress Note as above,  Neita Garnet ,MD

## 2015-02-04 NOTE — Progress Notes (Addendum)
CBG COMPLETED, COPY GIVEN TO NP.  D:  Patient continues to have SI thoughts, contracts for safety, no plan.  Denied HI.  Stated he does see shadows, and hears voices that he is no good.  "I just don't want to wake up.  I'm a 44 year old man who just can't make it by myself.  I talked to my mother last night." A:  Medications administered per MD orders.  Emotional support and encouragement given patient. R:  Safety maintained with 15 minute checks.

## 2015-02-05 DIAGNOSIS — F10239 Alcohol dependence with withdrawal, unspecified: Secondary | ICD-10-CM

## 2015-02-05 DIAGNOSIS — F259 Schizoaffective disorder, unspecified: Secondary | ICD-10-CM

## 2015-02-05 LAB — URIC ACID: Uric Acid, Serum: 9.9 mg/dL — ABNORMAL HIGH (ref 4.4–7.6)

## 2015-02-05 MED ORDER — NAPROXEN 250 MG PO TABS
250.0000 mg | ORAL_TABLET | Freq: Two times a day (BID) | ORAL | Status: DC
  Administered 2015-02-05 – 2015-02-09 (×8): 250 mg via ORAL
  Filled 2015-02-05 (×6): qty 1
  Filled 2015-02-05: qty 28
  Filled 2015-02-05 (×3): qty 1
  Filled 2015-02-05: qty 28
  Filled 2015-02-05: qty 1

## 2015-02-05 MED ORDER — ACAMPROSATE CALCIUM 333 MG PO TBEC
666.0000 mg | DELAYED_RELEASE_TABLET | Freq: Three times a day (TID) | ORAL | Status: DC
  Administered 2015-02-05 – 2015-02-09 (×12): 666 mg via ORAL
  Filled 2015-02-05: qty 84
  Filled 2015-02-05 (×3): qty 2
  Filled 2015-02-05: qty 84
  Filled 2015-02-05 (×2): qty 2
  Filled 2015-02-05: qty 84
  Filled 2015-02-05 (×10): qty 2

## 2015-02-05 MED ORDER — SERTRALINE HCL 25 MG PO TABS
75.0000 mg | ORAL_TABLET | Freq: Every day | ORAL | Status: DC
  Administered 2015-02-06 – 2015-02-09 (×4): 75 mg via ORAL
  Filled 2015-02-05 (×5): qty 3
  Filled 2015-02-05: qty 42

## 2015-02-05 MED ORDER — FAMOTIDINE 20 MG PO TABS
20.0000 mg | ORAL_TABLET | Freq: Two times a day (BID) | ORAL | Status: DC
  Administered 2015-02-05 – 2015-02-09 (×8): 20 mg via ORAL
  Filled 2015-02-05 (×6): qty 1
  Filled 2015-02-05: qty 28
  Filled 2015-02-05 (×3): qty 1
  Filled 2015-02-05: qty 28
  Filled 2015-02-05: qty 1

## 2015-02-05 MED ORDER — ZIPRASIDONE HCL 60 MG PO CAPS
60.0000 mg | ORAL_CAPSULE | Freq: Two times a day (BID) | ORAL | Status: DC
  Administered 2015-02-05 – 2015-02-09 (×8): 60 mg via ORAL
  Filled 2015-02-05 (×3): qty 1
  Filled 2015-02-05: qty 28
  Filled 2015-02-05 (×3): qty 1
  Filled 2015-02-05: qty 28
  Filled 2015-02-05 (×4): qty 1

## 2015-02-05 NOTE — Progress Notes (Signed)
D: Pt has depressed affect and depressed, anxious mood.  Pt reports he has "been all right."  Pt reports he was able to go to a group earlier today.  Pt reports the best part of his day was "the mid-day group with the social worker."  Pt reports withdrawal symptoms of anxiety, sweats, tremor.  He denies SI/HI, denies visual hallucinations.  Pt reports auditory hallucinations of "the same as yesterday."  Pt reported auditory hallucinations of voices saying negative things last night.  Pt stayed in his room for the majority of the evening.  He attended evening group.   A:  Met with pt 1:1 and provided support and encouragement.  Actively listened to pt.  Medications administered per order.  PRN medication administered for anxiety and pain. R: Pt is compliant with medications.  Pt verbally contracts for safety.  Will continue to monitor and assess.

## 2015-02-05 NOTE — Progress Notes (Signed)
Recreation Therapy Notes  Date: 06.20.16 Time: 9:30 am Location: [address] Group Room  Group Topic: Stress Management  Goal Area(s) Addresses:  Patient will verbalize importance of using healthy stress management.  Patient will identify positive emotions associated with healthy stress management.   Behavioral Response: Engaged  Intervention: Stress Management  Activity :  Guided Imagery.  LRT introduced and educated patients on stress management technique of guided imagery.  A script was used to deliver the technique to patients.  Patients were asked to follow script read a loud by LRT to engage in practicing the stress management technique.  Education:  Stress Management, Discharge Planning.   Education Outcome: Acknowledges edcuation/In group clarification offered/Needs additional education  Clinical Observations/Feedback: Patient attended group.   Victorino Sparrow, LRT/CTRS        Victorino Sparrow A 02/05/2015 4:06 PM

## 2015-02-05 NOTE — Clinical Social Work Note (Signed)
Care coordination referral made for patient due to increased need for services and multiple recent hospitalizations. CSW informed that care coordinator should contact him within a week.  Tilden Fossa, MSW, [place] Worker [facility] [phone]

## 2015-02-05 NOTE — Progress Notes (Signed)
Pt attended spiritual care group on grief and loss facilitated by chaplain Jerene Pitch. Group opened with brief discussion and psycho-social ed around grief and loss in relationships and in relation to self - identifying life patterns, circumstances, changes that cause losses. Established group norm of speaking from own life experience. Group goal of establishing open and affirming space for members to share loss and experience with grief, normalize grief experience and provide psycho social education and grief support.  Group drew on narrative and Alderian therapeutic modalities.   [name] was present throughout group.  He was attentive, non-verbal, with flat affect.  He listened while other members shared and discussed group topic.    [name], [name]

## 2015-02-05 NOTE — BHH Group Notes (Signed)
   [facility] LCSW Aftercare Discharge Planning Group Note  02/05/2015  8:45 AM   Participation Quality: Alert, Appropriate and Oriented  Mood/Affect: Depressed and Flat  Depression Rating: 9  Anxiety Rating: 9  Thoughts of Suicide: Pt denies SI/HI  Will you contract for safety? Yes  Current AVH: Pt denies  Plan for Discharge/Comments: Pt attended discharge planning group and actively participated in group. CSW provided pt with today's workbook. Patient reports feeling "humiliated and embarrassed" by his re-admission. He plans to return home to follow up with outpatient services at [facility].   Transportation Means: Pt reports access to transportation  Supports: Patient identifies his mother as supportive.  Tilden Fossa, MSW, [place] Worker [facility] [phone]

## 2015-02-05 NOTE — BHH Suicide Risk Assessment (Signed)
[place] INPATIENT:  Family/Significant Other Suicide Prevention Education  Suicide Prevention Education:  Education Completed; Mother Cher Nakai [phone],  (name of family member/significant other) has been identified by the patient as the family member/significant other with whom the patient will be residing, and identified as the person(s) who will aid the patient in the event of a mental health crisis (suicidal ideations/suicide attempt).  With written consent from the patient, the family member/significant other has been provided the following suicide prevention education, prior to the and/or following the discharge of the patient.  The suicide prevention education provided includes the following:  Suicide risk factors  Suicide prevention and interventions  National Suicide Hotline telephone number  [facility] assessment telephone number  [facility] Emergency Assistance [place] and/or Residential Mobile Crisis Unit telephone number  Request made of family/significant other to:  Remove weapons (e.g., guns, rifles, knives), all items previously/currently identified as safety concern.    Remove drugs/medications (over-the-counter, prescriptions, illicit drugs), all items previously/currently identified as a safety concern.  The family member/significant other verbalizes understanding of the suicide prevention education information provided.  The family member/significant other agrees to remove the items of safety concern listed above.  Anelly Samarin, [name] 02/05/2015, 10:42 AM

## 2015-02-05 NOTE — BHH Counselor (Signed)
[place] LCSW Group Therapy 02/05/2015  1:15 pm  Type of Therapy: Group Therapy Participation Level: Active  Participation Quality: Attentive, Sharing and Supportive  Affect: Depressed and Flat  Cognitive: Alert and Oriented  Insight: Developing/Improving and Engaged  Engagement in Therapy: Developing/Improving and Engaged  Modes of Intervention: Clarification, Confrontation, Discussion, Education, Exploration,  Limit-setting, Orientation, Problem-solving, Rapport Building, Art therapist, Socialization and Support  Summary of Progress/Problems: Pt identified obstacles faced currently and processed barriers involved in overcoming these obstacles. Pt identified steps necessary for overcoming these obstacles and explored motivation (internal and external) for facing these difficulties head on. Pt further identified one area of concern in their lives and chose a goal to focus on for today. Patient shared that he needs to "swallow my pride and let people help me." Patient shared that that he is tired of relapsing back into his mental health symptoms and alcohol addiction. He hopes to rely more on family support and would also like to get linked up with ACTT team services. CSW and other group members provided patient with emotional support and encouragement.  Tilden Fossa, MSW, [place] Worker [facility] [phone]

## 2015-02-05 NOTE — Plan of Care (Signed)
Problem: Alteration in mood & ability to function due to Goal: STG-Patient will report withdrawal symptoms Outcome: Progressing Pt reported withdrawal symptoms of anxiety, sweats, and tremors this shift.

## 2015-02-05 NOTE — Progress Notes (Addendum)
Patient ID: [name], male   DOB: [birth year], 44 y.o.   MRN: [id number] [facility] MD Progress Note  02/05/2015 1:29 PM Shaye Lagace  MRN:  [id number]  Subjective:  Patient reports slightly improved mood , but still feels depressed, sad, and somewhat anxious. He reports some ongoing tremors, but feels alcohol withdrawal symptoms are abating. He is future oriented and states " I have been thinking and I don't think it is good for me to live alone. My mom is coming from [place] to stay with me , and I  am going to let her deal with the finances, so that I do not use the money to drink".  Of note, reports increased " toe joint" pain after most recent drinking  Episode ,  and states he has had  Similar symptoms related to heavy drinking in the past. Denies medication side effects.  Describes ongoing insomnia.  Objectives:  I have discussed case with treatment team and have met with patient. No disruptive behaviors on unit, has been going to some groups . Remains depressed, sad, but reports partial improvement compared to admission. At this time denies any suicidal ideations. Symptoms of alcohol withdrawal are reported as improving- currently he is calm, not agitated. He does have some distal tremors, but no diaphoresis. As noted, reports some pain on metatarsal area of large toe ( R foot )- I have inspected foot and he does have some inflammation , redness, pain, which may be suggestive of podagra/ gout . We discussed cravings for alcohol- at this time denies severe cravings but does report history of cravings , resulting in relapses-we discussed Campral as an option and he agrees to trial. We reviewed side effects  Principal Problem:  Alcohol Dependence , Alcohol Withdrawal, Schizoaffective Disorder  Diagnosis:   Patient Active Problem List   Diagnosis Date Noted  . Alcohol dependence with alcohol-induced mood disorder [F10.24]   . Alcohol withdrawal [F10.239] 12/25/2014  . Schizoaffective  disorder, depressive type [F25.1]   . Alcohol use disorder, severe, dependence [F10.20] 11/29/2014  . Current smoker [Z72.0] 11/20/2014  . Tremors [G25.2] 11/03/2014  . Chronic posttraumatic stress disorder [F43.12]   . Protein-calorie malnutrition, severe [E43] 10/19/2014  . Hallucinations, visual [R44.1]   . Noncompliance with medication regimen [Z91.14]   . Hallucinations [R44.3] 07/07/2014  . Paranoia [F22] 07/07/2014  . Cannabis abuse [F12.10] 12/30/2013  . Schizoaffective disorder [F25.9] 12/29/2013  . Post traumatic stress disorder (PTSD) [F43.10] 11/03/2011   Total Time spent with patient: 20 minutes Past Medical History:  Past Medical History  Diagnosis Date  . Psychosis   . PTSD (post-traumatic stress disorder)   . Seizure disorder     related to etoh seizure  . Alcohol abuse   . Schizoaffective disorder   . Anxiety     at age 3  . Depression     at age 87    Past Surgical History  Procedure Laterality Date  . Foot surgery      right  . Hand surgery     Family History:  Family History  Problem Relation Age of Onset  . Alcoholism Mother   . CAD Father   . Depression Brother    Social History:  History  Alcohol Use  . Yes    Comment: "I'm a binge drinker.[name] 4-40 ounces/day"     History  Drug Use  . 1.00 per week  . Special: Marijuana    Comment: THC 2 to 3 times per month  History   Social History  . Marital Status: Divorced    Spouse Name: N/A  . Number of Children: N/A  . Years of Education: N/A   Social History Main Topics  . Smoking status: Current Every Day Smoker -- 1.50 packs/day for 24 years    Types: Cigarettes  . Smokeless tobacco: Never Used  . Alcohol Use: Yes     Comment: "I'm a binge drinker.[name] 4-40 ounces/day"  . Drug Use: 1.00 per week    Special: Marijuana     Comment: THC 2 to 3 times per month  . Sexual Activity: Yes   Other Topics Concern  . None   Social History Narrative   Additional History:    Sleep:  Fair  Appetite:  Improved   Musculoskeletal: Strength & Muscle Tone: within normal limits Gait & Station: normal Patient leans: N/A  Psychiatric Specialty Exam: Physical Exam  Review of Systems  Constitutional: Positive for chills, malaise/fatigue and diaphoresis.  HENT: Negative.   Eyes: Negative.   Respiratory: Negative.   Cardiovascular: Negative.   Gastrointestinal: Negative.   Genitourinary: Negative.   Musculoskeletal: Negative.   Skin: Negative.   Neurological: Positive for dizziness and weakness.  Endo/Heme/Allergies: Negative.   Psychiatric/Behavioral: Positive for depression and substance abuse (Alcoholism). Negative for suicidal ideas, hallucinations and memory loss. The patient is nervous/anxious and has insomnia.     Blood pressure 126/94, pulse 79, temperature 98.6 F (37 C), temperature source Oral, resp. rate 16, height 6' (1.829 m), weight 203 lb (92.08 kg).Body mass index is 27.53 kg/(m^2).  General Appearance: Casual  Eye Contact::  Good  Speech:  Clear and Coherent  Volume:  Normal  Mood:  Still depressed but improved compared to admission  Affect:  Still blunted, constricted   Thought Process:  Coherent  Orientation:  Full (Time, Place, and Person)  Thought Content:  States he continues to hear voices telling him " you are a piece of s....". No delusions, does not appear internally preoccupied   Suicidal Thoughts:  Denies plan or intention of hurting self - contracts for safety on the unit   Homicidal Thoughts:  No  Memory:  Grossly intact  Judgement:  Fair  Insight:  Present  Psychomotor Activity:  Mild distal tremors, no diaphoresis, no psychomotor agitation  Concentration:  Fair  Recall:  Good  Fund of Knowledge:Fair  Language: Good  Akathisia:  No  Handed:  Right  AIMS (if indicated):     Assets:  Communication Skills Desire for Improvement  ADL's:  Intact  Cognition: WNL  Sleep:      Current Medications: Current Facility-Administered  Medications  Medication Dose Route Frequency Provider Last Rate Last Dose  . acetaminophen (TYLENOL) tablet 650 mg  650 mg Oral Q6H PRN Encarnacion Slates, NP   650 mg at 02/05/15 0602  . alum & mag hydroxide-simeth (MAALOX/MYLANTA) 200-200-20 MG/5ML suspension 30 mL  30 mL Oral Q4H PRN Encarnacion Slates, NP      . gabapentin (NEURONTIN) capsule 400 mg  400 mg Oral QID Encarnacion Slates, NP   400 mg at 02/05/15 1147  . hydrOXYzine (ATARAX/VISTARIL) tablet 25 mg  25 mg Oral Q6H PRN Encarnacion Slates, NP   25 mg at 02/04/15 2131  . loperamide (IMODIUM) capsule 2-4 mg  2-4 mg Oral PRN Encarnacion Slates, NP   2 mg at 02/05/15 1208  . LORazepam (ATIVAN) tablet 1 mg  1 mg Oral Q6H PRN Encarnacion Slates, NP   1  mg at 02/05/15 0602  . LORazepam (ATIVAN) tablet 1 mg  1 mg Oral BID Encarnacion Slates, NP       Followed by  . [START ON 02/07/2015] LORazepam (ATIVAN) tablet 1 mg  1 mg Oral Daily Encarnacion Slates, NP      . magnesium hydroxide (MILK OF MAGNESIA) suspension 30 mL  30 mL Oral Daily PRN Encarnacion Slates, NP      . multivitamin with minerals tablet 1 tablet  1 tablet Oral Daily Encarnacion Slates, NP   1 tablet at 02/05/15 0756  . nicotine (NICODERM CQ - dosed in mg/24 hours) patch 21 mg  21 mg Transdermal Daily Encarnacion Slates, NP   21 mg at 02/05/15 0757  . ondansetron (ZOFRAN-ODT) disintegrating tablet 4 mg  4 mg Oral Q6H PRN Encarnacion Slates, NP      . sertraline (ZOLOFT) tablet 50 mg  50 mg Oral Daily Encarnacion Slates, NP   50 mg at 02/05/15 0756  . thiamine (VITAMIN B-1) tablet 100 mg  100 mg Oral Daily Encarnacion Slates, NP   100 mg at 02/05/15 0756  . traZODone (DESYREL) tablet 50 mg  50 mg Oral QHS,MR X 1 Encarnacion Slates, NP   50 mg at 02/04/15 2130  . ziprasidone (GEODON) capsule 40 mg  40 mg Oral BID WC Encarnacion Slates, NP   40 mg at 02/05/15 2831    Lab Results:  No results found for this or any previous visit (from the past 48 hour(s)).  Physical Findings: AIMS: Facial and Oral Movements Muscles of Facial Expression: None,  normal Lips and Perioral Area: None, normal Jaw: None, normal Tongue: None, normal,Extremity Movements Upper (arms, wrists, hands, fingers): None, normal Lower (legs, knees, ankles, toes): None, normal, Trunk Movements Neck, shoulders, hips: None, normal, Overall Severity Severity of abnormal movements (highest score from questions above): None, normal Incapacitation due to abnormal movements: None, normal Patient's awareness of abnormal movements (rate only patient's report): No Awareness, Dental Status Current problems with teeth and/or dentures?: No Does patient usually wear dentures?: No  CIWA:  CIWA-Ar Total: 3 COWS:  COWS Total Score: 4    Assessment and Plan - Alcohol Withdrawal symptoms present but improved- continue WDL protocol.  Schizoaffective Disorder improved, but remains depressed and reports ongoing hallucinations- continue Zoloft and  Consider increasing Geodon. Foot pain, inflammation noted, suspect gout related to heavy drinking- will request hospitalist consult to assess. Cravings have been a factor that has triggered relapses in the past- will consider Campral trial.    Treatment Plan Summary: Daily contact with patient to assess and evaluate symptoms and progress in treatment and Medication management:  Continue to provide encouragement and support regarding efforts to maintain sobriety. Encourage 12 step program attendance Start Campral 666 mgrs TID for alcohol cravings Increase Zoloft to 75 mgrs QDAY for management of depression Increase Geodon to 60 mgrs PO BID for management of schizoaffective disorder/psychotic symptoms ( Of note, EKG WNL , no QTc prolongation)  Obtain Hospitalist Consult to address potential Gout/podagra.  (Will request Uric Acid serum level)   Medical Decision Making:  Established Problem, Stable/Improving (1), Review of Psycho-Social Stressors (1), Review of Last Therapy Session (1), Review of Medication Regimen & Side Effects (2) and Review  of New Medication or Change in Dosage (2)  COBOS, FERNANDO,   02/05/2015, 1:29 PM   I spoke with Hospitalist- based on findings regarding metatarsal joint inflammation,  Recommendation is to start Naprosyn  250 mgrs BID, obtain Uric Acid, and will add Pepcid to decrease risk of NSAID related gastritis .

## 2015-02-05 NOTE — Progress Notes (Signed)
Patient ID: [name], male   DOB: [birth year], 44 y.o.   MRN: [id number] PER STATE REGULATIONS 482.30  THIS CHART WAS REVIEWED FOR MEDICAL NECESSITY WITH RESPECT TO THE PATIENT'S ADMISSION/DURATION OF STAY.  NEXT REVIEW DATE: 02/07/15  Roma Schanz, RN, BSN CASE MANAGER

## 2015-02-05 NOTE — Progress Notes (Signed)
D:  Patient's self inventory sheet, patient has fair sleep, no sleep medication administered.  Fair appetite, low energy level, poor concentration.  Rated depression and anxiety 9, hopeless 6.   Withdrawals, tremors, diarrhea, runny nose, sweats.  Denied SI.  Physical problems, headaches, worst pain #3, left foot.  Pain medication is helpful.  Goal is to work on discharge plan.  Plans to talk to SW and MD.  Does have discharge plan.  No problems anticipated after discharge. A:  Medications administered per MD orders.  Emotional support and encouragement given patient. R:  Denied SI & HI, contracts for safety.  Safety maintained with 15 minute checks.

## 2015-02-06 DIAGNOSIS — F333 Major depressive disorder, recurrent, severe with psychotic symptoms: Secondary | ICD-10-CM | POA: Diagnosis present

## 2015-02-06 MED ORDER — ZOLPIDEM TARTRATE 10 MG PO TABS
10.0000 mg | ORAL_TABLET | Freq: Every evening | ORAL | Status: DC | PRN
  Administered 2015-02-06 – 2015-02-08 (×3): 10 mg via ORAL
  Filled 2015-02-06 (×3): qty 1

## 2015-02-06 NOTE — Progress Notes (Signed)
Patient ID: [name], male   DOB: 1971/06/12, 44 y.o.   MRN: [id number] D: Client reports anxiety "5" of 10, "that's pretty normal for me" Client notes day has been "so so" Client denies AVH. A: Writer provided emotional support, encouraged client to report any concerns. Reviewed medications, administered as prescribed. Staff will monitor q48min for safety. R: Client is safe on the unit, attended group.

## 2015-02-06 NOTE — BHH Group Notes (Signed)
[name] Monserat Prestigiacomo, LCSW Social Worker Signed Psychiatry [facility] Counselor 02/05/2015 3:30 PM    Expand All Collapse All   [place] LCSW Group Therapy 02/05/2015 1:15 pm  Type of Therapy: Group Therapy Participation Level: Active  Participation Quality: Attentive, Sharing and Supportive  Affect: Depressed and Flat  Cognitive: Alert and Oriented  Insight: Developing/Improving and Engaged  Engagement in Therapy: Developing/Improving and Engaged  Modes of Intervention: Clarification, Confrontation, Discussion, Education, Exploration,  Limit-setting, Orientation, Problem-solving, Rapport Building, Art therapist, Socialization and Support  Summary of Progress/Problems: Pt identified obstacles faced currently and processed barriers involved in overcoming these obstacles. Pt identified steps necessary for overcoming these obstacles and explored motivation (internal and external) for facing these difficulties head on. Pt further identified one area of concern in their lives and chose a goal to focus on for today. Patient shared that he needs to "swallow my pride and let people help me." Patient shared that that he is tired of relapsing back into his mental health symptoms and alcohol addiction. He hopes to rely more on family support and would also like to get linked up with ACTT team services. CSW and other group members provided patient with emotional support and encouragement.  Tilden Fossa, MSW, [place] [facility] [place] Worker [facility] [phone]

## 2015-02-06 NOTE — Progress Notes (Signed)
Recreation Therapy Notes  Animal-Assisted Activity (AAA) Program Checklist/Progress Notes Patient Eligibility Criteria Checklist & Daily Group note for Rec Tx Intervention  Date: 06.21.16 Time: 2:30 pm Location: [address]  AAA/T Program Assumption of Risk Form signed by Patient/ or Parent Legal Guardian yes  Patient is free of allergies or sever asthma yes  Patient reports no fear of animals yes  Patient reports no history of cruelty to animalsyes  Patient understands his/her participation is voluntary yes  Patient washes hands before animal contact yes  Patient washes hands after animal contact yes  Behavioral Response: Engaged  Education: Contractor, Appropriate Animal Interaction   Education Outcome: Acknowledges understanding/In group clarification offered   Clinical Observations/Feedback: Patient attended group.   Victorino Sparrow, LRT/CTRS         Victorino Sparrow A 02/06/2015 4:06 PM

## 2015-02-06 NOTE — Clinical Social Work Note (Signed)
CSW spoke with patient's sister Remi Lopata [phone] to discuss patient's progress and treatment plans.  Tilden Fossa, MSW, [place] Worker [facility] [phone]

## 2015-02-06 NOTE — BHH Group Notes (Signed)
The focus of this group is to educate the patient on the purpose and policies of crisis stabilization and provide a format to answer questions about their admission.  The group details unit policies and expectations of patients while admitted.  Patient attended 0900 nurse education orientation group this morning.  Patient actively participated, appropriate affect.  Patient is alert, appropriate insight and engagement.  Today patient will work on 3 goals for discharge.

## 2015-02-06 NOTE — Tx Team (Addendum)
Interdisciplinary Treatment Plan Update (Adult) Date: 02/06/2015   Time Reviewed: 9:30 AM  Progress in Treatment: Attending groups: Yes Participating in groups: Yes Taking medication as prescribed: Yes Tolerating medication: Yes Family/Significant other contact made: Yes, CSW has spoken with patient's mother Patient understands diagnosis: Yes Discussing patient identified problems/goals with staff: Yes Medical problems stabilized or resolved: Yes Denies suicidal/homicidal ideation: Yes Issues/concerns per patient self-inventory: Yes Other:  New problem(s) identified: N/A  Discharge Plan or Barriers:  6/21: Patient plans to return home to follow up with [facility]. He will be signed up for TCT services prior to discharge.  Reason for Continuation of Hospitalization:  Depression Anxiety Medication Stabilization   Comments: N/A  Estimated length of stay: 3-5 days  For review of initial/current patient goals, please see plan of care. Patient is a 44 year old Caucasian Male admitted for SI, schizoaffective disorder, and alcohol abuse. Patient lives in [place] alone. Patient will benefit from crisis stabilization, medication evaluation, group therapy, and psycho education in addition to case management for discharge planning. Patient and CSW reviewed pt's identified goals and treatment plan. Pt verbalized understanding and agreed to treatment plan.   Attendees: Patient:    Family:    Physician: Dr. Parke Poisson; Dr. Sabra Heck 02/06/2015 9:30 AM  Nursing: Markham Jordan, [address], [address] [place] , [place] 02/06/2015 9:30 AM  Clinical Social Worker: Tilden Fossa, Peri Maris  LCSWA 02/06/2015 9:30 AM  Other: Joette Catching, LCSW 02/06/2015 9:30 AM  Other: Lucinda Dell, Beverly Sessions Liaison 02/06/2015 9:30 AM  Other: Lars Pinks, Case Manager 02/06/2015 9:30 AM  Other: Agustina Caroli, NP 02/06/2015 9:30 AM  Other:    Other:    Other:    Other:    Other:      Scribe for  Treatment Team:  Tilden Fossa, MSW, [employer] [phone]

## 2015-02-06 NOTE — Clinical Social Work Note (Signed)
CSW spoke with patient about Progressive treatment program, patient denies interest at this time.  Tilden Fossa, MSW, [place] Worker [facility] [phone]

## 2015-02-06 NOTE — BHH Group Notes (Signed)
[place] LCSW Group Therapy  02/06/2015   1:15 PM   Type of Therapy:  Group Therapy  Participation Level:  Active  Participation Quality:  Attentive, Sharing and Supportive  Affect:  Appropriate  Cognitive:  Alert and Oriented  Insight:  Developing/Improving and Engaged  Engagement in Therapy:  Developing/Improving and Engaged  Modes of Intervention:  Clarification, Confrontation, Discussion, Education, Exploration, Limit-setting, Orientation, Problem-solving, Rapport Building, Art therapist, Socialization and Support  Summary of Progress/Problems: The topic for group therapy was feelings about diagnosis.  Pt actively participated in group discussion on their past and current diagnosis and how they feel towards this.  Pt also identified how society and family members judge them, based on their diagnosis as well as stereotypes and stigmas. Patient identified with another patient's experience of being in denial of his illness initially. He shared his diagnosis with the group and shared that he is more than his disease and that it does not define him. He mentioned lack of strong support system as a barrier to his recovery. CSW and other group members provided patient with emotional support and encouragement.  Tilden Fossa, MSW, [place] Worker [facility] [phone]

## 2015-02-06 NOTE — Progress Notes (Signed)
D:  Patient's self inventory sheet, patient has fair sleep, no sleep medication given.  Good appetite, normal energy level, good concentration.  Rated depression 5, hopeless 4, anxiety 7.  Withdrawals are tremors, runny nose.  Denied SI.  Physical problems of pain.  Pain medication is helpful.  Worst pain in past 24 hours is #3.  Goal is to offer up more in groups.  Plans to participate and share more.  Does have discharge plans. A:  Medications administered per MD orders.  Emotional support and encouragement given patient. R:  Denied SI and HI, contracts for safety.  Denied visual hallucinations.  Stated he does hear voices telling him bad things about himself, "worthless".

## 2015-02-06 NOTE — Progress Notes (Signed)
[facility] MD Progress Note  02/06/2015 3:54 PM [name]  MRN:  [id number] Subjective:  [name] states that the same thing happened again. He did OK for a little while, his mother came and left his brother came occasionally to check on him but states he cant deal with the loneliness. He started drinking again and got depressed and start hearing  the voices and the cycle starts all over again. States that he needs someone to take care of him. States that his mother is coming back and the family is putting a plan together of how to help him. Did not sleep well last night. States Ambien works better for him  Principal Problem: <principal problem not specified> Diagnosis:   Patient Active Problem List   Diagnosis Date Noted  . Schizoaffective disorder [F25.9] 12/29/2013    Priority: High  . Alcohol dependence with alcohol-induced mood disorder [F10.24]   . Alcohol withdrawal [F10.239] 12/25/2014  . Schizoaffective disorder, depressive type [F25.1]   . Alcohol use disorder, severe, dependence [F10.20] 11/29/2014  . Current smoker [Z72.0] 11/20/2014  . Tremors [G25.2] 11/03/2014  . Chronic posttraumatic stress disorder [F43.12]   . Protein-calorie malnutrition, severe [E43] 10/19/2014  . Hallucinations, visual [R44.1]   . Noncompliance with medication regimen [Z91.14]   . Hallucinations [R44.3] 07/07/2014  . Paranoia [F22] 07/07/2014  . Cannabis abuse [F12.10] 12/30/2013  . Post traumatic stress disorder (PTSD) [F43.10] 11/03/2011   Total Time spent with patient: 30 minutes   Past Medical History:  Past Medical History  Diagnosis Date  . Psychosis   . PTSD (post-traumatic stress disorder)   . Seizure disorder     related to etoh seizure  . Alcohol abuse   . Schizoaffective disorder   . Anxiety     at age 41  . Depression     at age 33    Past Surgical History  Procedure Laterality Date  . Foot surgery      right  . Hand surgery     Family History:  Family History  Problem  Relation Age of Onset  . Alcoholism Mother   . CAD Father   . Depression Brother    Social History:  History  Alcohol Use  . Yes    Comment: "I'm a binge drinker.[name] 4-40 ounces/day"     History  Drug Use  . 1.00 per week  . Special: Marijuana    Comment: THC 2 to 3 times per month    History   Social History  . Marital Status: Divorced    Spouse Name: N/A  . Number of Children: N/A  . Years of Education: N/A   Social History Main Topics  . Smoking status: Current Every Day Smoker -- 1.50 packs/day for 24 years    Types: Cigarettes  . Smokeless tobacco: Never Used  . Alcohol Use: Yes     Comment: "I'm a binge drinker.[name] 4-40 ounces/day"  . Drug Use: 1.00 per week    Special: Marijuana     Comment: THC 2 to 3 times per month  . Sexual Activity: Yes   Other Topics Concern  . None   Social History Narrative   Additional History:    Sleep: Poor  Appetite:  Fair   Assessment:   Musculoskeletal: Strength & Muscle Tone: within normal limits Gait & Station: normal Patient leans: normal   Psychiatric Specialty Exam: Physical Exam  Review of Systems  Constitutional: Negative.   HENT: Negative.   Eyes: Negative.   Respiratory: Negative.  Cardiovascular: Negative.   Gastrointestinal: Negative.   Genitourinary: Negative.   Musculoskeletal: Negative.   Skin: Negative.   Neurological: Negative.   Endo/Heme/Allergies: Negative.   Psychiatric/Behavioral: Positive for depression and substance abuse. The patient is nervous/anxious.     Blood pressure 142/91, pulse 70, temperature 97.9 F (36.6 C), temperature source Oral, resp. rate 16, height 6' (1.829 m), weight 92.08 kg (203 lb).Body mass index is 27.53 kg/(m^2).  General Appearance: Fairly Groomed  Engineer, water::  Fair  Speech:  Clear and Coherent  Volume:  Decreased  Mood:  Anxious and Depressed  Affect:  Restricted  Thought Process:  Coherent and Goal Directed  Orientation:  Full (Time, Place, and  Person)  Thought Content:  symptoms events worries concerns  Suicidal Thoughts:  No  Homicidal Thoughts:  No  Memory:  Immediate;   Fair Recent;   Fair Remote;   Fair  Judgement:  Fair  Insight:  Present and Shallow  Psychomotor Activity:  Restlessness  Concentration:  Fair  Recall:  [employer] of Knowledge:Fair  Language: Fair  Akathisia:  No  Handed:  Right  AIMS (if indicated):     Assets:  Desire for Improvement Social Support  ADL's:  Intact  Cognition: WNL  Sleep:        Current Medications: Current Facility-Administered Medications  Medication Dose Route Frequency Provider Last Rate Last Dose  . acamprosate (CAMPRAL) tablet 666 mg  666 mg Oral TID WC Jenne Campus, MD   666 mg at 02/06/15 1204  . acetaminophen (TYLENOL) tablet 650 mg  650 mg Oral Q6H PRN Encarnacion Slates, NP   650 mg at 02/06/15 6433  . alum & mag hydroxide-simeth (MAALOX/MYLANTA) 200-200-20 MG/5ML suspension 30 mL  30 mL Oral Q4H PRN Encarnacion Slates, NP      . famotidine (PEPCID) tablet 20 mg  20 mg Oral BID Jenne Campus, MD   20 mg at 02/06/15 0756  . gabapentin (NEURONTIN) capsule 400 mg  400 mg Oral QID Encarnacion Slates, NP   400 mg at 02/06/15 1203  . [START ON 02/07/2015] LORazepam (ATIVAN) tablet 1 mg  1 mg Oral Daily Encarnacion Slates, NP      . magnesium hydroxide (MILK OF MAGNESIA) suspension 30 mL  30 mL Oral Daily PRN Encarnacion Slates, NP      . multivitamin with minerals tablet 1 tablet  1 tablet Oral Daily Encarnacion Slates, NP   1 tablet at 02/06/15 0755  . naproxen (NAPROSYN) tablet 250 mg  250 mg Oral BID WC Jenne Campus, MD   250 mg at 02/06/15 0756  . nicotine (NICODERM CQ - dosed in mg/24 hours) patch 21 mg  21 mg Transdermal Daily Encarnacion Slates, NP   21 mg at 02/06/15 0700  . sertraline (ZOLOFT) tablet 75 mg  75 mg Oral Daily Jenne Campus, MD   75 mg at 02/06/15 0756  . thiamine (VITAMIN B-1) tablet 100 mg  100 mg Oral Daily Encarnacion Slates, NP   100 mg at 02/06/15 0755  . traZODone  (DESYREL) tablet 50 mg  50 mg Oral QHS,MR X 1 Encarnacion Slates, NP   50 mg at 02/05/15 2203  . ziprasidone (GEODON) capsule 60 mg  60 mg Oral BID WC Jenne Campus, MD   60 mg at 02/06/15 2951    Lab Results:  Results for orders placed or performed during the hospital encounter of 02/03/15 (from the past 48  hour(s))  Uric acid     Status: Abnormal   Collection Time: 02/05/15  7:40 PM  Result Value Ref Range   Uric Acid, Serum 9.9 (H) 4.4 - 7.6 mg/dL    Comment: Performed at [facility]    Physical Findings: AIMS: Facial and Oral Movements Muscles of Facial Expression: None, normal Lips and Perioral Area: None, normal Jaw: None, normal Tongue: None, normal,Extremity Movements Upper (arms, wrists, hands, fingers): None, normal Lower (legs, knees, ankles, toes): None, normal, Trunk Movements Neck, shoulders, hips: None, normal, Overall Severity Severity of abnormal movements (highest score from questions above): None, normal Incapacitation due to abnormal movements: None, normal Patient's awareness of abnormal movements (rate only patient's report): No Awareness, Dental Status Current problems with teeth and/or dentures?: No Does patient usually wear dentures?: No  CIWA:  CIWA-Ar Total: 1 COWS:  COWS Total Score: 1  Treatment Plan Summary: Daily contact with patient to assess and evaluate symptoms and progress in treatment and Medication management Supportive approach/coping skills Alcohol dependence; complete the detox protocol/work a relapse prevention plan Hallucinations; continue the Geodon 60 mg BID Depression; continue the Zoloft 75 mg and reassess for an increase to 100 mg daily Insomnia; will use the Ambien 10 mg HS PRN sleep while he is here Will explore other outpatient options to provide the structure he needs  Will use CBT/mindfulness  Medical Decision Making:  Review of Psycho-Social Stressors (1), Review or order clinical lab tests (1), Review of  Medication Regimen & Side Effects (2) and Review of New Medication or Change in Dosage (2)     Yaritzy Huser A 02/06/2015, 3:54 PM

## 2015-02-06 NOTE — Progress Notes (Addendum)
D: Patient states that he has left foot pain without numbness or tingling. Medication administered. Patient attended group session tonight. Patient denies SI, HI, AVH. Patient stated that he would like to continue to working on discharge plan as a goal.   A: Patient encouraged to express feelings, positive reinforcement and coping skills encouraged. Safety checks q 15 maintained.  R: Patient is receptive to group therapy, intervention, and appropriate interaction with staff and peers.

## 2015-02-07 NOTE — Plan of Care (Signed)
Problem: Ineffective individual coping Goal: LTG: Patient will report a decrease in negative feelings Outcome: Progressing Pt stated I'm "Doing pretty good, I don't feel like dying" Goal: STG: Patient will remain free from self harm Outcome: Progressing Pt safe on the unit  Problem: Alteration in thought process Goal: STG-Patient is able to discuss thoughts with staff Outcome: Progressing Pt told writer how he was feeling and how he feels hopeful about going home   Problem: Diagnosis: Increased Risk For Suicide Attempt Goal: LTG-Patient Will Report Improved Mood and Deny Suicidal LTG (by discharge) Patient will report improved mood and deny suicidal ideation.  Outcome: Progressing Pt denies SI and said he felt better today

## 2015-02-07 NOTE — Progress Notes (Signed)
D: Per patient self inventory form patient reports fair sleep with the use of sleep medication. He reports a good appetite, normal energy level, good concentration. He rates depression 3/10, hopelessness 2/10, anxiety 5/10 - all on 1-10 scale, 10 being the worse. He denies any physical pain. He denies SI/HI . Positive for auditory hallucinations "I hear voices telling me negative things, like your stupid." Pleasant. Observed pacing the hall through out the shift, responding to internal stimuli. Anxiety noted.    A: Special checks q 15 mins in place for safety. Encouragement and counseling provided. Medication administered per MD order (see eMAr).    R: Safety maintained. Compliant with medication regimen. Nursing staff will continue to monitor.

## 2015-02-07 NOTE — Plan of Care (Signed)
Problem: Ineffective individual coping Goal: STG: Patient will remain free from self harm Outcome: Progressing Patient has remained free from self harm.

## 2015-02-07 NOTE — BHH Group Notes (Signed)
[place] LCSW Group Therapy 02/07/2015  1:15 PM Type of Therapy: Group Therapy Participation Level: Active  Participation Quality: Attentive, Sharing and Supportive  Affect: Appropriate  Cognitive: Alert and Oriented  Insight: Developing/Improving and Engaged  Engagement in Therapy: Developing/Improving and Engaged  Modes of Intervention: Clarification, Confrontation, Discussion, Education, Exploration, Limit-setting, Orientation, Problem-solving, Rapport Building, Art therapist, Socialization and Support  Summary of Progress/Problems: The topic for group today was emotional regulation. This group focused on both positive and negative emotion identification and allowed group members to process ways to identify feelings, regulate negative emotions, and find healthy ways to manage internal/external emotions. Group members were asked to reflect on a time when their reaction to an emotion led to a negative outcome and explored how alternative responses using emotion regulation would have benefited them. Group members were also asked to discuss a time when emotion regulation was utilized when a negative emotion was experienced. Patient engaged in discussion of emotions, stressors, and healthy vs. Unhealthy coping skills.   Tilden Fossa, MSW, [place] Worker [facility] [phone]

## 2015-02-07 NOTE — Progress Notes (Signed)
Patient stated that he had a good day today. Pt says that he enjoyed the group with the Education officer, museum. Patient says that upon discharge he will be attending a dual diagnosis program which included going to [facility].

## 2015-02-07 NOTE — Progress Notes (Signed)
D:  +ve AH- non command. Pt denies SI/HI/VH. Pt is pleasant and cooperative. Pt stated he was "feeling pretty good, don't feel like dying". Pt appears to have brighter spirits. Pt seems to feel hopeful about his future.   A: Pt was offered support and encouragement. Pt was given scheduled medications. Pt was encourage to attend groups. Q 15 minute checks were done for safety. 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   R:Pt attends groups and interacts well with peers and staff. Pt is taking medication. Pt has no complaints at this time .Pt receptive to treatment and safety maintained on unit.

## 2015-02-07 NOTE — Progress Notes (Signed)
Patient ID: [name], male   DOB: [birth year], 44 y.o.   MRN: [id number] PER STATE REGULATIONS 482.30  THIS CHART WAS REVIEWED FOR MEDICAL NECESSITY WITH RESPECT TO THE PATIENT'S ADMISSION/ DURATION OF STAY.  NEXT REVIEW DATE:  02/11/2015  Chauncy Lean, RN, BSN CASE MANAGER

## 2015-02-07 NOTE — BHH Group Notes (Signed)
   [place] LCSW Aftercare Discharge Planning Group Note  02/07/2015  8:45 AM   Participation Quality: Alert, Appropriate and Oriented  Mood/Affect: Appropriate  Depression Rating: 2  Anxiety Rating: 5  Thoughts of Suicide: Pt denies SI/HI  Will you contract for safety? Yes  Current AVH: Pt denies  Plan for Discharge/Comments: Pt attended discharge planning group and actively participated in group. CSW provided pt with today's workbook. Patient reports feeling "good" today. He plans to return home to follow up with outpatient services.  Transportation Means: Pt reports access to transportation  Supports: No supports mentioned at this time  Tilden Fossa, MSW, [place] Social Worker [employer] [phone]

## 2015-02-07 NOTE — Progress Notes (Signed)
Recreation Therapy Notes  Date: 06.22.16 Time: 9:30 am Location: [address] Dayroom  Group Topic: Stress Management  Goal Area(s) Addresses:  Patient will verbalize importance of using healthy stress management.  Patient will identify positive emotions associated with healthy stress management.   Behavioral Response: Engaged  Intervention: Guided Imagery Script  Activity :  LRT will introduce and educate patient on the stress management technique of guided imagery.  A script was used to deliver the technique to patients.  Patients were asked to follow the script read aloud by the LRT to engage in practicing guided imagery.  Education:  Stress Management, Discharge Planning.   Education Outcome: Acknowledges edcuation/In group clarification offered/Needs additional education  Clinical Observations/Feedback: Patient attended group.  Victorino Sparrow, LRT/CTRS        Victorino Sparrow A 02/07/2015 4:30 PM

## 2015-02-07 NOTE — Progress Notes (Signed)
[facility] MD Progress Note  02/07/2015 6:58 PM [name]  MRN:  [id number] Subjective:  [name] continues to settle down. Admits that he is getting tired of these cycles. Wants to get his life to be more stable. States he is willing to relinquish control of his finances to his family so he does not have the means of getting alcohol. He still thinks his mother is going to move down here and stay with him "and take care of him" Principal Problem: Severe recurrent major depression with psychotic features Diagnosis:   Patient Active Problem List   Diagnosis Date Noted  . Severe recurrent major depression with psychotic features [F33.3] 02/06/2015    Priority: High  . Alcohol dependence with alcohol-induced mood disorder [F10.24]     Priority: High  . Schizoaffective disorder, depressive type [F25.1]     Priority: High  . Schizoaffective disorder [F25.9] 12/29/2013    Priority: High  . Alcohol withdrawal [F10.239] 12/25/2014  . Alcohol use disorder, severe, dependence [F10.20] 11/29/2014  . Current smoker [Z72.0] 11/20/2014  . Tremors [G25.2] 11/03/2014  . Chronic posttraumatic stress disorder [F43.12]   . Protein-calorie malnutrition, severe [E43] 10/19/2014  . Hallucinations, visual [R44.1]   . Noncompliance with medication regimen [Z91.14]   . Hallucinations [R44.3] 07/07/2014  . Paranoia [F22] 07/07/2014  . Cannabis abuse [F12.10] 12/30/2013  . Post traumatic stress disorder (PTSD) [F43.10] 11/03/2011   Total Time spent with patient: 30 minutes   Past Medical History:  Past Medical History  Diagnosis Date  . Psychosis   . PTSD (post-traumatic stress disorder)   . Seizure disorder     related to etoh seizure  . Alcohol abuse   . Schizoaffective disorder   . Anxiety     at age 67  . Depression     at age 45    Past Surgical History  Procedure Laterality Date  . Foot surgery      right  . Hand surgery     Family History:  Family History  Problem Relation Age of Onset   . Alcoholism Mother   . CAD Father   . Depression Brother    Social History:  History  Alcohol Use  . Yes    Comment: "I'm a binge drinker.[name] 4-40 ounces/day"     History  Drug Use  . 1.00 per week  . Special: Marijuana    Comment: THC 2 to 3 times per month    History   Social History  . Marital Status: Divorced    Spouse Name: N/A  . Number of Children: N/A  . Years of Education: N/A   Social History Main Topics  . Smoking status: Current Every Day Smoker -- 1.50 packs/day for 24 years    Types: Cigarettes  . Smokeless tobacco: Never Used  . Alcohol Use: Yes     Comment: "I'm a binge drinker.[name] 4-40 ounces/day"  . Drug Use: 1.00 per week    Special: Marijuana     Comment: THC 2 to 3 times per month  . Sexual Activity: Yes   Other Topics Concern  . None   Social History Narrative   Additional History:    Sleep: Fair  Appetite:  Fair   Assessment:   Musculoskeletal: Strength & Muscle Tone: within normal limits Gait & Station: normal Patient leans: normal   Psychiatric Specialty Exam: Physical Exam  Review of Systems  Constitutional: Negative.   HENT: Negative.   Eyes: Negative.   Respiratory: Negative.   Cardiovascular: Negative.  Gastrointestinal: Negative.   Genitourinary: Negative.   Musculoskeletal: Negative.   Skin: Negative.   Neurological: Negative.   Endo/Heme/Allergies: Negative.   Psychiatric/Behavioral: Positive for depression and substance abuse. The patient is nervous/anxious.     Blood pressure 156/91, pulse 108, temperature 98.7 F (37.1 C), temperature source Oral, resp. rate 20, height 6' (1.829 m), weight 92.08 kg (203 lb).Body mass index is 27.53 kg/(m^2).  General Appearance: Fairly Groomed  Engineer, water::  Fair  Speech:  Clear and Coherent  Volume:  Decreased  Mood:  Anxious and worried  Affect:  Restricted  Thought Process:  Coherent and Goal Directed  Orientation:  Full (Time, Place, and Person)  Thought Content:   symptoms events worries concerns  Suicidal Thoughts:  No  Homicidal Thoughts:  No  Memory:  Immediate;   Fair Recent;   Fair Remote;   Fair  Judgement:  Fair  Insight:  Present and Shallow  Psychomotor Activity:  Restlessness  Concentration:  Fair  Recall:  [employer] of Knowledge:Fair  Language: Fair  Akathisia:  No  Handed:  Right  AIMS (if indicated):     Assets:  Desire for Improvement Housing Social Support  ADL's:  Intact  Cognition: WNL  Sleep:        Current Medications: Current Facility-Administered Medications  Medication Dose Route Frequency Provider Last Rate Last Dose  . acamprosate (CAMPRAL) tablet 666 mg  666 mg Oral TID WC Jenne Campus, MD   666 mg at 02/07/15 1646  . acetaminophen (TYLENOL) tablet 650 mg  650 mg Oral Q6H PRN Encarnacion Slates, NP   650 mg at 02/06/15 4034  . alum & mag hydroxide-simeth (MAALOX/MYLANTA) 200-200-20 MG/5ML suspension 30 mL  30 mL Oral Q4H PRN Encarnacion Slates, NP      . famotidine (PEPCID) tablet 20 mg  20 mg Oral BID Jenne Campus, MD   20 mg at 02/07/15 1646  . gabapentin (NEURONTIN) capsule 400 mg  400 mg Oral QID Encarnacion Slates, NP   400 mg at 02/07/15 1646  . magnesium hydroxide (MILK OF MAGNESIA) suspension 30 mL  30 mL Oral Daily PRN Encarnacion Slates, NP      . multivitamin with minerals tablet 1 tablet  1 tablet Oral Daily Encarnacion Slates, NP   1 tablet at 02/07/15 0744  . naproxen (NAPROSYN) tablet 250 mg  250 mg Oral BID WC Myer Peer Cobos, MD   250 mg at 02/07/15 1646  . nicotine (NICODERM CQ - dosed in mg/24 hours) patch 21 mg  21 mg Transdermal Daily Encarnacion Slates, NP   21 mg at 02/07/15 7425  . sertraline (ZOLOFT) tablet 75 mg  75 mg Oral Daily Jenne Campus, MD   75 mg at 02/07/15 0744  . thiamine (VITAMIN B-1) tablet 100 mg  100 mg Oral Daily Encarnacion Slates, NP   100 mg at 02/07/15 0745  . traZODone (DESYREL) tablet 50 mg  50 mg Oral QHS,MR X 1 Encarnacion Slates, NP   50 mg at 02/05/15 2203  . ziprasidone (GEODON)  capsule 60 mg  60 mg Oral BID WC Jenne Campus, MD   60 mg at 02/07/15 1646  . zolpidem (AMBIEN) tablet 10 mg  10 mg Oral QHS PRN Nicholaus Bloom, MD   10 mg at 02/06/15 2140    Lab Results:  Results for orders placed or performed during the hospital encounter of 02/03/15 (from the past 48 hour(s))  Uric acid     Status: Abnormal   Collection Time: 02/05/15  7:40 PM  Result Value Ref Range   Uric Acid, Serum 9.9 (H) 4.4 - 7.6 mg/dL    Comment: Performed at [facility]    Physical Findings: AIMS: Facial and Oral Movements Muscles of Facial Expression: None, normal Lips and Perioral Area: None, normal Jaw: None, normal Tongue: None, normal,Extremity Movements Upper (arms, wrists, hands, fingers): None, normal Lower (legs, knees, ankles, toes): None, normal, Trunk Movements Neck, shoulders, hips: None, normal, Overall Severity Severity of abnormal movements (highest score from questions above): None, normal Incapacitation due to abnormal movements: None, normal Patient's awareness of abnormal movements (rate only patient's report): No Awareness, Dental Status Current problems with teeth and/or dentures?: No Does patient usually wear dentures?: No  CIWA:  CIWA-Ar Total: 5 COWS:  COWS Total Score: 1  Treatment Plan Summary: Daily contact with patient to assess and evaluate symptoms and progress in treatment and Medication management Supportive approach/copign skills Alcohol dependence; complete the detox Work a relapse prevention plan Optimize response to the psychotropics Will continue to assess for the need for a residential treatment program given the almost back to back admissions. This plan that he is presenting is the same of the last couple of times that has not worked for him  Medical Decision Making:  Review of Psycho-Social Stressors (1), Review or order clinical lab tests (1), Review of Medication Regimen & Side Effects (2) and Review of New Medication  or Change in Dosage (2)     Veronica Guerrant A 02/07/2015, 6:58 PM

## 2015-02-07 NOTE — Plan of Care (Signed)
Problem: Alteration in mood & ability to function due to Goal: LTG-Pt reports reduction in suicidal thoughts (Patient reports reduction in suicidal thoughts and is able to verbalize a safety plan for whenever patient is feeling suicidal)  Outcome: Progressing Patient denies suicidal thoughts .     

## 2015-02-07 NOTE — Progress Notes (Signed)
Pt. Attended AA group.

## 2015-02-08 NOTE — Progress Notes (Signed)
[facility] MD Progress Note  02/08/2015 7:41 PM [name]  MRN:  [id number] Subjective:  [name] continues to express he is better. He is anticipating being able to go home tomorrow. He is expecting his mother to come into town and stay to help get him organized have a structure be his payee. He is going to have the [place] transition team helping him out service he has not have before Principal Problem: Severe recurrent major depression with psychotic features Diagnosis:   Patient Active Problem List   Diagnosis Date Noted  . Severe recurrent major depression with psychotic features [F33.3] 02/06/2015    Priority: High  . Alcohol dependence with alcohol-induced mood disorder [F10.24]     Priority: High  . Schizoaffective disorder, depressive type [F25.1]     Priority: High  . Schizoaffective disorder [F25.9] 12/29/2013    Priority: High  . Alcohol withdrawal [F10.239] 12/25/2014  . Alcohol use disorder, severe, dependence [F10.20] 11/29/2014  . Current smoker [Z72.0] 11/20/2014  . Tremors [G25.2] 11/03/2014  . Chronic posttraumatic stress disorder [F43.12]   . Protein-calorie malnutrition, severe [E43] 10/19/2014  . Hallucinations, visual [R44.1]   . Noncompliance with medication regimen [Z91.14]   . Hallucinations [R44.3] 07/07/2014  . Paranoia [F22] 07/07/2014  . Cannabis abuse [F12.10] 12/30/2013  . Post traumatic stress disorder (PTSD) [F43.10] 11/03/2011   Total Time spent with patient: 30 minutes   Past Medical History:  Past Medical History  Diagnosis Date  . Psychosis   . PTSD (post-traumatic stress disorder)   . Seizure disorder     related to etoh seizure  . Alcohol abuse   . Schizoaffective disorder   . Anxiety     at age 73  . Depression     at age 69    Past Surgical History  Procedure Laterality Date  . Foot surgery      right  . Hand surgery     Family History:  Family History  Problem Relation Age of Onset  . Alcoholism Mother   . CAD Father    . Depression Brother    Social History:  History  Alcohol Use  . Yes    Comment: "I'm a binge drinker.[name] 4-40 ounces/day"     History  Drug Use  . 1.00 per week  . Special: Marijuana    Comment: THC 2 to 3 times per month    History   Social History  . Marital Status: Divorced    Spouse Name: N/A  . Number of Children: N/A  . Years of Education: N/A   Social History Main Topics  . Smoking status: Current Every Day Smoker -- 1.50 packs/day for 24 years    Types: Cigarettes  . Smokeless tobacco: Never Used  . Alcohol Use: Yes     Comment: "I'm a binge drinker.[name] 4-40 ounces/day"  . Drug Use: 1.00 per week    Special: Marijuana     Comment: THC 2 to 3 times per month  . Sexual Activity: Yes   Other Topics Concern  . None   Social History Narrative   Additional History:    Sleep: Fair  Appetite:  Fair   Assessment:   Musculoskeletal: Strength & Muscle Tone: within normal limits Gait & Station: normal Patient leans: normal   Psychiatric Specialty Exam: Physical Exam  Review of Systems  Constitutional: Negative.   HENT: Negative.   Eyes: Negative.   Respiratory: Negative.   Cardiovascular: Negative.   Gastrointestinal: Negative.   Genitourinary: Negative.  Musculoskeletal: Negative.   Skin: Negative.   Neurological: Negative.   Endo/Heme/Allergies: Negative.   Psychiatric/Behavioral: Positive for depression and substance abuse.    Blood pressure 159/99, pulse 55, temperature 97.8 F (36.6 C), temperature source Oral, resp. rate 17, height 6' (1.829 m), weight 92.08 kg (203 lb).Body mass index is 27.53 kg/(m^2).  General Appearance: Fairly Groomed  Engineer, water::  Fair  Speech:  Clear and Coherent  Volume:  Normal  Mood:  Anxious  Affect:  Appropriate  Thought Process:  Coherent and Goal Directed  Orientation:  Full (Time, Place, and Person)  Thought Content:  symptoms events worries concerns  Suicidal Thoughts:  No  Homicidal Thoughts:  No   Memory:  Immediate;   Fair Recent;   Fair Remote;   Fair  Judgement:  Fair  Insight:  Present  Psychomotor Activity:  Restlessness  Concentration:  Fair  Recall:  [employer] of Knowledge:Fair  Language: Fair  Akathisia:  No  Handed:  Right  AIMS (if indicated):     Assets:  Desire for Improvement Housing Social Support  ADL's:  Intact  Cognition: WNL  Sleep:  Number of Hours: 5.25     Current Medications: Current Facility-Administered Medications  Medication Dose Route Frequency Provider Last Rate Last Dose  . acamprosate (CAMPRAL) tablet 666 mg  666 mg Oral TID WC Jenne Campus, MD   666 mg at 02/08/15 1627  . acetaminophen (TYLENOL) tablet 650 mg  650 mg Oral Q6H PRN Encarnacion Slates, NP   650 mg at 02/06/15 9833  . alum & mag hydroxide-simeth (MAALOX/MYLANTA) 200-200-20 MG/5ML suspension 30 mL  30 mL Oral Q4H PRN Encarnacion Slates, NP      . famotidine (PEPCID) tablet 20 mg  20 mg Oral BID Jenne Campus, MD   20 mg at 02/08/15 1627  . gabapentin (NEURONTIN) capsule 400 mg  400 mg Oral QID Encarnacion Slates, NP   400 mg at 02/08/15 1627  . magnesium hydroxide (MILK OF MAGNESIA) suspension 30 mL  30 mL Oral Daily PRN Encarnacion Slates, NP      . multivitamin with minerals tablet 1 tablet  1 tablet Oral Daily Encarnacion Slates, NP   1 tablet at 02/08/15 8250  . naproxen (NAPROSYN) tablet 250 mg  250 mg Oral BID WC Myer Peer Cobos, MD   250 mg at 02/08/15 1627  . nicotine (NICODERM CQ - dosed in mg/24 hours) patch 21 mg  21 mg Transdermal Daily Encarnacion Slates, NP   21 mg at 02/08/15 5397  . sertraline (ZOLOFT) tablet 75 mg  75 mg Oral Daily Jenne Campus, MD   75 mg at 02/08/15 0752  . thiamine (VITAMIN B-1) tablet 100 mg  100 mg Oral Daily Encarnacion Slates, NP   100 mg at 02/08/15 0753  . traZODone (DESYREL) tablet 50 mg  50 mg Oral QHS,MR X 1 Encarnacion Slates, NP   50 mg at 02/05/15 2203  . ziprasidone (GEODON) capsule 60 mg  60 mg Oral BID WC Jenne Campus, MD   60 mg at 02/08/15 1628  .  zolpidem (AMBIEN) tablet 10 mg  10 mg Oral QHS PRN Nicholaus Bloom, MD   10 mg at 02/07/15 2139    Lab Results: No results found for this or any previous visit (from the past 48 hour(s)).  Physical Findings: AIMS: Facial and Oral Movements Muscles of Facial Expression: None, normal Lips and Perioral Area: None, normal  Jaw: None, normal Tongue: None, normal,Extremity Movements Upper (arms, wrists, hands, fingers): None, normal Lower (legs, knees, ankles, toes): None, normal, Trunk Movements Neck, shoulders, hips: None, normal, Overall Severity Severity of abnormal movements (highest score from questions above): None, normal Incapacitation due to abnormal movements: None, normal Patient's awareness of abnormal movements (rate only patient's report): No Awareness, Dental Status Current problems with teeth and/or dentures?: No Does patient usually wear dentures?: No  CIWA:  CIWA-Ar Total: 1 COWS:  COWS Total Score: 1  Treatment Plan Summary: Daily contact with patient to assess and evaluate symptoms and progress in treatment and Medication management Supportive approach/coping skills Alcohol dependence; continue to work a relapse prevention plan Alcohol cravings: continue to use the Campral Depression with psychosis; continue the Geodon Zoloft Coordinate with the [employer] for a possible D/C in the morning  Medical Decision Making:  Review of Psycho-Social Stressors (1), Review of Medication Regimen & Side Effects (2) and Review of New Medication or Change in Dosage (2)     Sofi Bryars A 02/08/2015, 7:41 PM

## 2015-02-08 NOTE — Plan of Care (Signed)
Problem: Consults Goal: Suicide Risk Patient Education (See Patient Education module for education specifics)  Outcome: Completed/Met Date Met:  02/08/15 Nurse discussed suicidal thoughts/coping skills with pt.

## 2015-02-08 NOTE — BHH Group Notes (Signed)
The focus of this group is to educate the patient on the purpose and policies of crisis stabilization and provide a format to answer questions about their admission.  The group details unit policies and expectations of patients while admitted.  Patient attended 0900 nurse education orientation group this morning.  Patient actively participated and appropriate affect.  Patient is alert, appropriate insight and engagement.  Today patient will work on 3 goals for discharge.

## 2015-02-08 NOTE — Progress Notes (Signed)
Patient did attend the evening karaoke group. Pt was engaged and supportive but did not participate by singing a song.    

## 2015-02-08 NOTE — Progress Notes (Signed)
D: Pt denies SI/HI/AVH. Pt is pleasant and cooperative. Pt stated he was excited to go home. Pt says he needs to get his life together.   A: Pt was offered support and encouragement. Pt was given scheduled medications. Pt was encourage to attend groups. Q 15 minute checks were done for safety.   R:Pt attends groups and interacts well with peers and staff. Pt is taking medication. Pt has no complaints at this time.Pt receptive to treatment and safety maintained on unit.

## 2015-02-08 NOTE — Plan of Care (Signed)
Problem: Consults Goal: Substance Abuse Patient Education See Patient Education Module for education specifics.  Outcome: Completed/Met Date Met:  02/08/15 Nurse discussed substance abuse and coping skills with patient.     

## 2015-02-08 NOTE — Plan of Care (Signed)
Problem: Alteration in thought process Goal: LTG-Patient verbalizes understanding importance med regimen (Patient verbalizes understanding of importance of medication regimen and need to continue outpatient care.)  Outcome: Progressing Pt stated he will stay on meds when he leaves.   Problem: Alteration in mood Goal: LTG-Patient reports reduction in suicidal thoughts (Patient reports reduction in suicidal thoughts and is able to verbalize a safety plan for whenever patient is feeling suicidal)  Outcome: Progressing Pt denies SI at this time

## 2015-02-08 NOTE — Progress Notes (Signed)
D:  Patient's self inventory sheet, patient has fair sleep, sleep medication is helpful.  Good appetite, normal energy level, good concentration.  Rated depression and hopeless 2, anxiety 4.  Withdrawals, runny nose.  Denied SI.  Denied physical problems.  Denied pain.  Taking pain medication which is helpful.  Goal is to work on Radiographer, therapeutic.  Plans to try and learn something in groups.  Does have discharge plans. A:  Medications administered per MD orders.  Emotional support and encouragement given patient. R:  Denied SI and HI, contracts for safety.  Denied A/V hallucinations.  Safety maintained with 15 minute checks.

## 2015-02-09 MED ORDER — GABAPENTIN 400 MG PO CAPS: 400.0000 mg | ORAL_CAPSULE | Freq: Four times a day (QID) | ORAL | Status: DC

## 2015-02-09 MED ORDER — ADULT MULTIVITAMIN W/MINERALS CH: 1.0000 | ORAL_TABLET | Freq: Every day | ORAL | Status: DC

## 2015-02-09 MED ORDER — FAMOTIDINE 20 MG PO TABS: 20.0000 mg | ORAL_TABLET | Freq: Two times a day (BID) | ORAL | Status: DC

## 2015-02-09 MED ORDER — ZOLPIDEM TARTRATE 10 MG PO TABS: 10.0000 mg | ORAL_TABLET | Freq: Every evening | ORAL | Status: DC | PRN

## 2015-02-09 MED ORDER — NICOTINE 21 MG/24HR TD PT24: 21.0000 mg | MEDICATED_PATCH | Freq: Every day | TRANSDERMAL | Status: DC

## 2015-02-09 MED ORDER — ACAMPROSATE CALCIUM 333 MG PO TBEC: 666.0000 mg | DELAYED_RELEASE_TABLET | Freq: Three times a day (TID) | ORAL | Status: DC

## 2015-02-09 MED ORDER — NAPROXEN 250 MG PO TABS: 250.0000 mg | ORAL_TABLET | Freq: Two times a day (BID) | ORAL | Status: DC

## 2015-02-09 MED ORDER — SERTRALINE HCL 25 MG PO TABS: 75.0000 mg | ORAL_TABLET | Freq: Every day | ORAL | Status: DC

## 2015-02-09 MED ORDER — ZIPRASIDONE HCL 60 MG PO CAPS: 60.0000 mg | ORAL_CAPSULE | Freq: Two times a day (BID) | ORAL | Status: DC

## 2015-02-09 NOTE — BHH Suicide Risk Assessment (Signed)
[facility] Discharge Suicide Risk Assessment   Demographic Factors:  Male and Caucasian  Total Time spent with patient: 30 minutes  Musculoskeletal: Strength & Muscle Tone: within normal limits Gait & Station: normal Patient leans: normal  Psychiatric Specialty Exam: Physical Exam  Review of Systems  Constitutional: Negative.   HENT: Negative.   Eyes: Negative.   Respiratory: Negative.   Cardiovascular: Negative.   Gastrointestinal: Negative.   Genitourinary: Negative.   Musculoskeletal: Negative.   Skin: Negative.   Neurological: Negative.   Endo/Heme/Allergies: Negative.   Psychiatric/Behavioral: Positive for substance abuse. The patient is nervous/anxious.     Blood pressure 126/79, pulse 78, temperature 97.8 F (36.6 C), temperature source Oral, resp. rate 16, height 6' (1.829 m), weight 92.08 kg (203 lb).Body mass index is 27.53 kg/(m^2).  General Appearance: Fairly Groomed  Engineer, water::  Fair  Speech:  Clear and [id number]  Volume:  Normal  Mood:  Euthymic  Affect:  Appropriate  Thought Process:  Coherent and Goal Directed  Orientation:  Full (Time, Place, and Person)  Thought Content:  plans as he moves on, relapse prevention plan  Suicidal Thoughts:  No  Homicidal Thoughts:  No  Memory:  Immediate;   Fair Recent;   Fair Remote;   Fair  Judgement:  Fair  Insight:  Present  Psychomotor Activity:  Normal  Concentration:  Fair  Recall:  [employer] of [place]  Language: Fair  Akathisia:  No  Handed:  Right  AIMS (if indicated):     Assets:  Desire for Improvement Housing Social Support  Sleep:  Number of Hours: 5.25  Cognition: WNL  ADL's:  Intact   Have you used any form of tobacco in the last 30 days? (Cigarettes, Smokeless Tobacco, Cigars, and/or Pipes): Yes  Has this patient used any form of tobacco in the last 30 days? (Cigarettes, Smokeless Tobacco, Cigars, and/or Pipes) Yes, A prescription for an FDA-approved tobacco cessation medication was  offered at discharge and the patient refused  Mental Status Per Nursing Assessment::   On Admission:     Current Mental Status by Physician: In full contact with reality. There are no active S/S of withdrawal. There are no active SI plans or intent. States he feels ready to go home. He is going have services trough the [facility] transitional team. States those services plus his mother being more available to him and administering his money should help this time around to keep his appointments and his sobriety   Loss Factors: NA  Historical Factors: NA  Risk Reduction Factors:   Living with another person, especially a relative and Positive social support  Continued Clinical Symptoms:  Depression:   Severe Alcohol/Substance Abuse/Dependencies  Cognitive Features That Contribute To Risk:  Closed-mindedness, Polarized thinking and Thought constriction (tunnel vision)    Suicide Risk:  Minimal: No identifiable suicidal ideation.  Patients presenting with no risk factors but with morbid ruminations; may be classified as minimal risk based on the severity of the depressive symptoms  Principal Problem: Severe recurrent major depression with psychotic features Discharge Diagnoses:  Patient Active Problem List   Diagnosis Date Noted  . Severe recurrent major depression with psychotic features [F33.3] 02/06/2015    Priority: High  . Alcohol dependence with alcohol-induced mood disorder [F10.24]     Priority: High  . Schizoaffective disorder, depressive type [F25.1]     Priority: High  . Schizoaffective disorder [F25.9] 12/29/2013    Priority: High  . Alcohol withdrawal [F10.239] 12/25/2014  . Alcohol use  disorder, severe, dependence [F10.20] 11/29/2014  . Current smoker [Z72.0] 11/20/2014  . Tremors [G25.2] 11/03/2014  . Chronic posttraumatic stress disorder [F43.12]   . Protein-calorie malnutrition, severe [E43] 10/19/2014  . Hallucinations, visual [R44.1]   . Noncompliance with  medication regimen [Z91.14]   . Hallucinations [R44.3] 07/07/2014  . Paranoia [F22] 07/07/2014  . Cannabis abuse [F12.10] 12/30/2013  . Post traumatic stress disorder (PTSD) [F43.10] 11/03/2011    Follow-up Information    Follow up with [facility].   Specialty:  Behavioral Health   Why:  [facility]itional Care Team (TCT) will schedule follow up appointment and will contact you with that appointment information.   Contact information:   [address] [address] [phone]       Follow up with [facility] On 02/14/2015.   Why:  Please call at 9 am on Wednesday June 29th to schedule follow up primary care appointment with Dr. Laurin Coder.    Contact information:   [address]  [place], [place] [address] [phone]      Plan Of Care/Follow-up recommendations:  Activity:  as tolerated Diet:  regular Follow up as above Is patient on multiple antipsychotic therapies at discharge:  No   Has Patient had three or more failed trials of antipsychotic monotherapy by history:  No  Recommended Plan for Multiple Antipsychotic Therapies: NA    Ata Pecha A 02/09/2015, 12:42 PM

## 2015-02-09 NOTE — Progress Notes (Signed)
D: Per patient self inventory form; patient reports he slept good last night with the use of sleep medication. He reports a good appetite, normal energy level, good concentration. Reports depression 0/10, hopelessness 0/10, anxiety 4/10; all on 0-10 scale; 10 being the worse. He denies any physical pain. He denies SI/HI. Positive for auditory hallucinations; patient reports this is his normal "The voices are not as bad today." Patient reports he is excited to be discharged today; "I feel like I have been here a long time."   A: Patient remains on special checks q 15 mins for safety. Medication administered per MD order. Encouragement and counseling provided. Discharge planning in place.    R: Safety maintained. Compliant with medication regimen. Will continue to monitor.

## 2015-02-09 NOTE — Tx Team (Signed)
Interdisciplinary Treatment Plan Update (Adult) Date: 02/09/2015   Time Reviewed: 9:30 AM  Progress in Treatment: Attending groups: Yes Participating in groups: Yes Taking medication as prescribed: Yes Tolerating medication: Yes Family/Significant other contact made: Yes, CSW has spoken with patient's mother and sister Patient understands diagnosis: Yes Discussing patient identified problems/goals with staff: Yes Medical problems stabilized or resolved: Yes Denies suicidal/homicidal ideation: Yes Issues/concerns per patient self-inventory: Yes Other:  New problem(s) identified: N/A  Discharge Plan or Barriers:  6/21: Patient plans to return home to follow up with [facility]. He will be signed up for TCT services prior to discharge.  6/24: Patient plans to return home to follow up with [facility] and TCT services.  Reason for Continuation of Hospitalization:  Depression Anxiety Medication Stabilization   Comments: N/A  Estimated length of stay: Discharge anticipated for today 6/24.  For review of initial/current patient goals, please see plan of care. Patient is a 44 year old Caucasian Male admitted for SI, schizoaffective disorder, and alcohol abuse. Patient lives in [place] alone. Patient will benefit from crisis stabilization, medication evaluation, group therapy, and psycho education in addition to case management for discharge planning. Patient and CSW reviewed pt's identified goals and treatment plan. Pt verbalized understanding and agreed to treatment plan.   Attendees: Patient:    Family:    Physician: Dr. Parke Poisson; Dr. Sabra Heck 02/09/2015 9:30 AM  Nursing: Markham Jordan, Festus Aloe, Charlyne Quale, RN 02/09/2015 9:30 AM  Clinical Social Worker: Tilden Fossa,  [place] 02/09/2015 9:30 AM  Other: Joette Catching, LCSW 02/09/2015 9:30 AM  Other: Lucinda Dell, Beverly Sessions Liaison 02/09/2015 9:30 AM  Other: Lars Pinks, Case Manager 02/09/2015 9:30 AM  Other: Earleen Newport,  May Augustin NP 02/09/2015 9:30 AM  Other:    Other:    Other:    Other:    Other:      Scribe for Treatment Team:  Tilden Fossa, MSW, [place] [phone]

## 2015-02-09 NOTE — Progress Notes (Signed)
  [facility] Adult Case Management Discharge Plan :  Will you be returning to the same living situation after discharge:  Yes,  patient plans to return to his apartment At discharge, do you have transportation home?: Yes,  patient reports that his brother will provide transport Do you have the ability to pay for your medications: Yes,  patient will be provided with medication samples and prescriptions  Release of information consent forms completed and in the chart;  Patient's signature needed at discharge.  Patient to Follow up at: Follow-up Information    Follow up with [facility].   Specialty:  Behavioral Health   Why:  [facility] Transitional Care Team (TCT) will schedule follow up appointment and will contact you with that appointment information.   Contact information:   [place] [address] [phone]       Follow up with [facility] and [place] On 02/14/2015.   Why:  Please call at 9 am on Wednesday June 29th to schedule follow up primary care appointment with Dr. Laurin Coder.    Contact information:   [address]  [place], [place] [address] [phone]      Patient denies SI/HI: Yes,  denies    Land and Suicide Prevention discussed: Yes,  with patient and mother  Have you used any form of tobacco in the last 30 days? (Cigarettes, Smokeless Tobacco, Cigars, and/or Pipes): Yes  Has patient been referred to the Quitline?: Patient refused referral  Shresta Risden, [name] 02/09/2015, 9:20 AM

## 2015-02-09 NOTE — Discharge Summary (Signed)
Physician Discharge Summary Note  Patient:  [name] is an 44 y.o., male MRN:  [id number] DOB:  11/24/1970 Patient phone:  [phone] (home)  Patient address:   [address] [place] [address],  Total Time spent with patient: Greater than 30 minutes  Date of Admission:  02/03/2015 Date of Discharge: 02/09/2015  Reason for Admission:  Per H&P:  [name] is a 44 year old male who was brought to [facility] by EMS with concerns of alcohol withdrawal and suicidal thoughts. Patient was also experiencing auditory hallucinations calling him derogatory names. Patient reports he has been drinking since age [age] and has been drinking a case of beer a day for the past several weeks. He reports hx of alcohol related seizures in the past. Patient also uses THC about 1-2 per month. The patient had limited ability to participate in his psychiatric assessment today due to severe withdrawal symptoms. [name] was recently discharged from [facility] on 12/25/14 and reports relapsing on alcohol about a week after leaving. Patient stated "I don't feel well. I am shaky and sweaty. Also depressed. I have been drinking a case of beer daily. It go to the point I felt like I was dying. I did not want to wake up. I think I was trying to drink myself to death." The patient has been started on the Ativan protocol due to elevated liver enzymes. [name] admits that he was not taking his medications as prescribed when he was abusing alcohol on a daily basis. He is currently feeling paranoid, which is possibly related to alcohol withdrawal symptoms.   Principal Problem: Severe recurrent major depression with psychotic features Discharge Diagnoses: Patient Active Problem List   Diagnosis Date Noted  . Severe recurrent major depression with psychotic features [F33.3] 02/06/2015  . Alcohol dependence with alcohol-induced mood disorder [F10.24]   . Alcohol withdrawal [F10.239] 12/25/2014  . Schizoaffective disorder,  depressive type [F25.1]   . Alcohol use disorder, severe, dependence [F10.20] 11/29/2014  . Current smoker [Z72.0] 11/20/2014  . Tremors [G25.2] 11/03/2014  . Chronic posttraumatic stress disorder [F43.12]   . Protein-calorie malnutrition, severe [E43] 10/19/2014  . Hallucinations, visual [R44.1]   . Noncompliance with medication regimen [Z91.14]   . Hallucinations [R44.3] 07/07/2014  . Paranoia [F22] 07/07/2014  . Cannabis abuse [F12.10] 12/30/2013  . Schizoaffective disorder [F25.9] 12/29/2013  . Post traumatic stress disorder (PTSD) [F43.10] 11/03/2011    Musculoskeletal: Strength & Muscle Tone: within normal limits Gait & Station: normal Patient leans: N/A  Psychiatric Specialty Exam:  See Suicide Risk Assessment Physical Exam  Nursing note and vitals reviewed. Constitutional: He is oriented to person, place, and time.  Respiratory: Effort normal.  Musculoskeletal: Normal range of motion.  Neurological: He is alert and oriented to person, place, and time.    Review of Systems  Psychiatric/Behavioral: Positive for substance abuse. Negative for suicidal ideas, hallucinations and memory loss. Depression: Stable. Nervous/anxious: Stable. Insomnia: stable.   All other systems reviewed and are negative.   Blood pressure 126/79, pulse 78, temperature 97.8 F (36.6 C), temperature source Oral, resp. rate 16, height 6' (1.829 m), weight 92.08 kg (203 lb).Body mass index is 27.53 kg/(m^2).  Have you used any form of tobacco in the last 30 days? (Cigarettes, Smokeless Tobacco, Cigars, and/or Pipes): Yes  Has this patient used any form of tobacco in the last 30 days? (Cigarettes, Smokeless Tobacco, Cigars, and/or Pipes) Yes, A prescription for an FDA-approved tobacco cessation medication was offered at discharge and the patient refused  Past Medical History:  Past Medical History  Diagnosis Date  . Psychosis   . PTSD (post-traumatic stress disorder)   . Seizure disorder     related  to etoh seizure  . Alcohol abuse   . Schizoaffective disorder   . Anxiety     at age 44  . Depression     at age 32    Past Surgical History  Procedure Laterality Date  . Foot surgery      right  . Hand surgery     Family History:  Family History  Problem Relation Age of Onset  . Alcoholism Mother   . CAD Father   . Depression Brother    Social History:  History  Alcohol Use  . Yes    Comment: "I'm a binge drinker.[name] 4-40 ounces/day"     History  Drug Use  . 1.00 per week  . Special: Marijuana    Comment: THC 2 to 3 times per month    History   Social History  . Marital Status: Divorced    Spouse Name: N/A  . Number of Children: N/A  . Years of Education: N/A   Social History Main Topics  . Smoking status: Current Every Day Smoker -- 1.50 packs/day for 24 years    Types: Cigarettes  . Smokeless tobacco: Never Used  . Alcohol Use: Yes     Comment: "I'm a binge drinker.[name] 4-40 ounces/day"  . Drug Use: 1.00 per week    Special: Marijuana     Comment: THC 2 to 3 times per month  . Sexual Activity: Yes   Other Topics Concern  . None   Social History Narrative   Risk to Self: Is patient at risk for suicide?: Yes Risk to Others:   Prior Inpatient Therapy:   Prior Outpatient Therapy:    Level of Care:  OP  Hospital Course:  [name] was admitted for Severe recurrent major depression with psychotic features and crisis management.  He was treated discharged with the medications listed below under Medication List.  Medical problems were identified and treated as needed.  Home medications were restarted as appropriate.  Improvement was monitored by observation and [name] daily report of symptom reduction.  Emotional and mental status was monitored by daily self-inventory reports completed by [name] and clinical staff.         [name] was evaluated by the treatment team for stability and plans for continued recovery upon discharge.   [name] motivation was an integral factor for scheduling further treatment.  Employment, transportation, bed availability, health status, family support, and any pending legal issues were also considered during his hospital stay.  He was offered further treatment options upon discharge including but not limited to Residential, Intensive Outpatient, and Outpatient treatment.  Franck Vinal will follow up with the services as listed below under Follow Up Information.     Upon completion of this admission the patient was both mentally and medically stable for discharge denying suicidal/homicidal ideation, auditory/visual/tactile hallucinations, delusional thoughts and paranoia.      Consults:  psychiatry  Significant Diagnostic Studies:  labs: Uric acid, CBC, CMET, ETOH, UDS,   Discharge Vitals:   Blood pressure 126/79, pulse 78, temperature 97.8 F (36.6 C), temperature source Oral, resp. rate 16, height 6' (1.829 m), weight 92.08 kg (203 lb). Body mass index is 27.53 kg/(m^2). Lab Results:   No results found for this or any previous visit (from the past 72 hour(s)).  Physical Findings: AIMS: Facial and Oral Movements Muscles of Facial Expression: None, normal Lips and Perioral Area: None, normal Jaw: None, normal Tongue: None, normal,Extremity Movements Upper (arms, wrists, hands, fingers): None, normal Lower (legs, knees, ankles, toes): None, normal, Trunk Movements Neck, shoulders, hips: None, normal, Overall Severity Severity of abnormal movements (highest score from questions above): None, normal Incapacitation due to abnormal movements: None, normal Patient's awareness of abnormal movements (rate only patient's report): No Awareness, Dental Status Current problems with teeth and/or dentures?: No Does patient usually wear dentures?: No  CIWA:  CIWA-Ar Total: 1 COWS:  COWS Total Score: 1   See Psychiatric Specialty Exam and Suicide Risk Assessment completed by Attending  Physician prior to discharge.  Discharge destination:  Home  Is patient on multiple antipsychotic therapies at discharge:  No   Has Patient had three or more failed trials of antipsychotic monotherapy by history:  No    Recommended Plan for Multiple Antipsychotic Therapies: NA      Discharge Instructions    Activity as tolerated - No restrictions    Complete by:  As directed      Diet general    Complete by:  As directed      Discharge instructions    Complete by:  As directed   Take all of you medications as prescribed by your mental healthcare provider.  Report any adverse effects and reactions from your medications to your outpatient provider promptly. Do not engage in alcohol and or illegal drug use while on prescription medicines. In the event of worsening symptoms call the crisis hotline, 911, and or go to the nearest emergency department for appropriate evaluation and treatment of symptoms. Follow-up with your primary care provider for your medical issues, concerns and or health care needs.   Keep all scheduled appointments.  If you are unable to keep an appointment call to reschedule.  Let the nurse know if you will need medications before next scheduled appointment.            Medication List    STOP taking these medications        traZODone 50 MG tablet  Commonly known as:  DESYREL      TAKE these medications      Indication   acamprosate 333 MG tablet  Commonly known as:  CAMPRAL  Take 2 tablets (666 mg total) by mouth 3 (three) times daily with meals.   Indication:  Excessive Use of Alcohol     famotidine 20 MG tablet  Commonly known as:  PEPCID  Take 1 tablet (20 mg total) by mouth 2 (two) times daily.   Indication:  Gastroesophageal Reflux Disease     gabapentin 400 MG capsule  Commonly known as:  NEURONTIN  Take 1 capsule (400 mg total) by mouth 4 (four) times daily.   Indication:  Agitation, Neuropathic Pain     multivitamin with minerals Tabs  tablet  Take 1 tablet by mouth daily.   Indication:  Nutritional Support     naproxen 250 MG tablet  Commonly known as:  NAPROSYN  Take 1 tablet (250 mg total) by mouth 2 (two) times daily with a meal.   Indication:  Mild to Moderate Pain     nicotine 21 mg/24hr patch  Commonly known as:  NICODERM CQ - dosed in mg/24 hours  Place 1 patch (21 mg total) onto the skin daily.   Indication:  Nicotine Addiction     sertraline 25 MG tablet  Commonly known  as:  ZOLOFT  Take 3 tablets (75 mg total) by mouth daily.   Indication:  Major Depressive Disorder     ziprasidone 60 MG capsule  Commonly known as:  GEODON  Take 1 capsule (60 mg total) by mouth 2 (two) times daily with a meal.   Indication:  Mood control     zolpidem 10 MG tablet  Commonly known as:  AMBIEN  Take 1 tablet (10 mg total) by mouth at bedtime as needed for sleep.   Indication:  Trouble Sleeping       Follow-up Information    Follow up with [facility].   Specialty:  Behavioral Health   Why:  [facility]'s Transitional Care Team (TCT) will schedule follow up appointment and will contact you with that appointment information.   Contact information:   [place] [address] [phone]       Follow up with [facility] and [place] On 02/14/2015.   Why:  Please call at 9 am on Wednesday June 29th to schedule follow up primary care appointment with Dr. Laurin Coder.    Contact information:   [address]  [place], [place] [address] [phone]      Follow-up recommendations:  Activity:  As tolerated Diet:  As tolerated  Comments:   Patient has been instructed to take medications as prescribed; and report adverse effects to outpatient provider.  Follow up with primary doctor for any medical issues and If symptoms recur report to nearest emergency or crisis hot line.    Total Discharge Time: Greater than 30 minutes  Signed: Earleen Newport, FNP-BC 02/09/2015, 4:36 PM  I personally assessed  the patient and formulated the plan Geralyn Flash A. Sabra Heck, M.D.

## 2015-02-09 NOTE — Progress Notes (Signed)
Recreation Therapy Notes  Date: 06.24.16 Time: 9:30 am Location: [address] Group Room  Group Topic: Stress Management  Goal Area(s) Addresses:  Patient will verbalize importance of using healthy stress management.  Patient will identify positive emotions associated with healthy stress management.   Behavioral Response: Engaged  Intervention: Stress Management  Activity :  Progressive Muscle Relaxation.  LRT introduced and educated patients on the stress management technique of progressive muscle relaxation.  A script was used to deliver the technique to patients.  Patients were asked to follow the script read aloud by LRT to engage in practicing the stress management techniques.  Education:  Stress Management, Discharge Planning.   Education Outcome: Acknowledges edcuation/In group clarification offered/Needs additional education  Clinical Observations/Feedback: Patient attended group.   Victorino Sparrow, LRT/CTRS  Ria Comment, Takao Lizer A 02/09/2015 1:10 PM

## 2015-02-09 NOTE — BHH Group Notes (Signed)
   [facility] LCSW Aftercare Discharge Planning Group Note  02/09/2015  8:45 AM   Participation Quality: Alert, Appropriate and Oriented  Mood/Affect: Appropriate  Depression Rating: 0  Anxiety Rating: 5  Thoughts of Suicide: Pt denies SI/HI  Will you contract for safety? Yes  Current AVH: Pt denies  Plan for Discharge/Comments: Pt attended discharge planning group and actively participated in group. CSW provided pt with today's workbook. Patient plans to return home to follow up with [facility] and TCT services.  Transportation Means: Pt reports access to transportation by brother  Supports: No supports mentioned at this time  Tilden Fossa, MSW, [place] Social Worker [employer] [phone]

## 2015-02-09 NOTE — Progress Notes (Signed)
Discharge note: Patient discharged per MD order. Discharge summary reviewed with patient. Patient verbally expresses understanding of discharge. Medication sample and RX given to patient. Patient verbalizes understanding of medication regimen. Patient denies SI/HI at discharge. Patient denies pain at discharge. All items returned to patient from locker. Patient verbally states he received all items and signed that he received all items in locker. Patient reports brother is at facility for discharge. Patient ambulatory out of facility. Brother in lobby.

## 2015-02-16 ENCOUNTER — Encounter: Payer: Self-pay | Admitting: Family Medicine

## 2015-02-16 ENCOUNTER — Ambulatory Visit: Payer: Medicare Other | Attending: Family Medicine | Admitting: Family Medicine

## 2015-02-16 VITALS — BP 126/84 | HR 90 | Temp 97.8°F | Resp 18 | Ht 71.0 in | Wt 208.2 lb

## 2015-02-16 DIAGNOSIS — F251 Schizoaffective disorder, depressive type: Secondary | ICD-10-CM | POA: Diagnosis not present

## 2015-02-16 DIAGNOSIS — M10079 Idiopathic gout, unspecified ankle and foot: Secondary | ICD-10-CM | POA: Diagnosis not present

## 2015-02-16 DIAGNOSIS — M109 Gout, unspecified: Secondary | ICD-10-CM | POA: Insufficient documentation

## 2015-02-16 MED ORDER — NAPROXEN 500 MG PO TABS: 500.0000 mg | ORAL_TABLET | Freq: Two times a day (BID) | ORAL | Status: DC

## 2015-02-16 MED ORDER — ALLOPURINOL 100 MG PO TABS: 100.0000 mg | ORAL_TABLET | Freq: Every day | ORAL | Status: DC

## 2015-02-16 NOTE — Assessment & Plan Note (Signed)
A: Schizoaffective disorder: stable  P: Keep close follow up with [facility]

## 2015-02-16 NOTE — Assessment & Plan Note (Signed)
Gout  Last uric acid 9.9 Goal uric acid < 6 Start allopurinol 100 mg daily, increase to 200 mg  Daily in 2 weeks When starting allopurinol or increasing the dose, take naproxen 500 mg twice daily for first 3-5 to prevent acute gout attack

## 2015-02-16 NOTE — Patient Instructions (Signed)
Mr. Detjen,  Thank you for coming in today  1. Schizoaffective disorder: Keep close follow up with [facility]  2. Gout  Last uric acid 9.9 Goal uric acid < 6 Start allopurinol 100 mg daily, increase to 200 mg  Daily in 2 weeks When starting allopurinol or increasing the dose, take naproxen 500 mg twice daily for first 3-5 to prevent acute gout attack  F/u in 4 weeks for lab visit CBC and uric acid level F/u with me in 2 months with me for gout   Dr. Adrian Blackwater

## 2015-02-16 NOTE — Progress Notes (Signed)
   Subjective:    Patient ID: [name], male    DOB: 1971-08-15, 44 y.o.   MRN: [id number] CC: f.u schizoaffective disorder, ? Gout  HPI  44 yo M presents for f/u visit:  1. Schizoaffective disorder: compliant with medication regimen. Has close follow up with psych. Followed by [facility]. Was hospitalized for severe recurrent major depression with psychotic features from 02/03/2015 to 02/09/2015.   2. Gout: gets pain and swelling in knees and feet. Elevated uric acid. Drinks ETOH heavily at times. Tolerates NSAIDs.   Review of Systems  Constitutional: Negative for fever, chills, fatigue and unexpected weight change.  Eyes: Negative for visual disturbance.  Respiratory: Negative for cough and shortness of breath.   Cardiovascular: Negative for chest pain, palpitations and leg swelling.  Gastrointestinal: Negative for nausea, vomiting, abdominal pain, diarrhea, constipation and blood in stool.  Musculoskeletal: Positive for arthralgias.  Skin: Negative for rash.  Psychiatric/Behavioral:       Denies SI Admits to visual and auditory hallucinations Denies command hallucinations Admits to berating hallucinations    GAD-7: score of 4. 1 to 1-4 and 0 to all others      Objective:   Physical Exam BP 126/84 mmHg  Pulse 90  Temp(Src) 97.8 F (36.6 C) (Oral)  Resp 18  Ht 5\' 11"  (1.803 m)  Wt 208 lb 3.2 oz (94.439 kg)  BMI 29.05 kg/m2  SpO2 95% General appearance: alert, cooperative and no distress Lungs: normal WOB  Extremities: extremities normal, atraumatic, no cyanosis or edema       Assessment & Plan:

## 2015-02-16 NOTE — Progress Notes (Signed)
Patient here for check up.  Patient reports having blood work done at behavioral health and states he might have gout due to elevated uric acid levels.  Patient states he "feels fine" today.  Patient reports "flare ups" in feet and knees, and thinks that's where the gout comes up.  Patient denies pain at this time.    Patient states he occaisionally takes Vistaril for anxiety.

## 2015-02-28 ENCOUNTER — Observation Stay (HOSPITAL_COMMUNITY)
Admission: AD | Admit: 2015-02-28 | Discharge: 2015-03-01 | Disposition: A | Discharge status: Home / Self Care | Payer: Medicare Other | Source: Intra-hospital | Attending: Family | Admitting: Family

## 2015-02-28 ENCOUNTER — Encounter (HOSPITAL_COMMUNITY): Payer: Self-pay | Admitting: *Deleted

## 2015-02-28 ENCOUNTER — Emergency Department (HOSPITAL_COMMUNITY)
Admission: EM | Admit: 2015-02-28 | Discharge: 2015-02-28 | Disposition: A | Discharge status: Other Acute Inpatient Hospital | Payer: Medicare Other | Attending: Emergency Medicine | Admitting: Emergency Medicine

## 2015-02-28 DIAGNOSIS — F1210 Cannabis abuse, uncomplicated: Secondary | ICD-10-CM | POA: Diagnosis not present

## 2015-02-28 DIAGNOSIS — Z888 Allergy status to other drugs, medicaments and biological substances status: Secondary | ICD-10-CM | POA: Insufficient documentation

## 2015-02-28 DIAGNOSIS — F10239 Alcohol dependence with withdrawal, unspecified: Secondary | ICD-10-CM | POA: Insufficient documentation

## 2015-02-28 DIAGNOSIS — R45851 Suicidal ideations: Secondary | ICD-10-CM | POA: Diagnosis present

## 2015-02-28 DIAGNOSIS — F1020 Alcohol dependence, uncomplicated: Secondary | ICD-10-CM | POA: Diagnosis present

## 2015-02-28 DIAGNOSIS — G40909 Epilepsy, unspecified, not intractable, without status epilepticus: Secondary | ICD-10-CM | POA: Diagnosis not present

## 2015-02-28 DIAGNOSIS — F39 Unspecified mood [affective] disorder: Secondary | ICD-10-CM | POA: Diagnosis not present

## 2015-02-28 DIAGNOSIS — F259 Schizoaffective disorder, unspecified: Secondary | ICD-10-CM | POA: Diagnosis not present

## 2015-02-28 DIAGNOSIS — F419 Anxiety disorder, unspecified: Secondary | ICD-10-CM | POA: Insufficient documentation

## 2015-02-28 DIAGNOSIS — M109 Gout, unspecified: Secondary | ICD-10-CM | POA: Diagnosis not present

## 2015-02-28 DIAGNOSIS — F4312 Post-traumatic stress disorder, chronic: Secondary | ICD-10-CM | POA: Insufficient documentation

## 2015-02-28 DIAGNOSIS — F10230 Alcohol dependence with withdrawal, uncomplicated: Secondary | ICD-10-CM | POA: Insufficient documentation

## 2015-02-28 DIAGNOSIS — Z791 Long term (current) use of non-steroidal anti-inflammatories (NSAID): Secondary | ICD-10-CM | POA: Insufficient documentation

## 2015-02-28 DIAGNOSIS — Z79899 Other long term (current) drug therapy: Secondary | ICD-10-CM | POA: Insufficient documentation

## 2015-02-28 DIAGNOSIS — F1024 Alcohol dependence with alcohol-induced mood disorder: Secondary | ICD-10-CM | POA: Diagnosis not present

## 2015-02-28 DIAGNOSIS — F329 Major depressive disorder, single episode, unspecified: Secondary | ICD-10-CM | POA: Insufficient documentation

## 2015-02-28 LAB — CBC WITH DIFFERENTIAL/PLATELET
Basophils Absolute: 0 10*3/uL (ref 0.0–0.1)
Basophils Relative: 0 % (ref 0–1)
Eosinophils Absolute: 0 10*3/uL (ref 0.0–0.7)
Eosinophils Relative: 0 % (ref 0–5)
HCT: 45.6 % (ref 39.0–52.0)
Hemoglobin: 16.4 g/dL (ref 13.0–17.0)
Lymphocytes Relative: 40 % (ref 12–46)
Lymphs Abs: 2 10*3/uL (ref 0.7–4.0)
MCH: 31.9 pg (ref 26.0–34.0)
MCHC: 36 g/dL (ref 30.0–36.0)
MCV: 88.7 fL (ref 78.0–100.0)
Monocytes Absolute: 0.8 10*3/uL (ref 0.1–1.0)
Monocytes Relative: 17 % — ABNORMAL HIGH (ref 3–12)
Neutro Abs: 2.1 10*3/uL (ref 1.7–7.7)
Neutrophils Relative %: 43 % (ref 43–77)
Platelets: 268 10*3/uL (ref 150–400)
RBC: 5.14 MIL/uL (ref 4.22–5.81)
RDW: 13.8 % (ref 11.5–15.5)
WBC: 5 10*3/uL (ref 4.0–10.5)

## 2015-02-28 LAB — COMPREHENSIVE METABOLIC PANEL
ALT: 62 U/L (ref 17–63)
AST: 65 U/L — ABNORMAL HIGH (ref 15–41)
Albumin: 5.4 g/dL — ABNORMAL HIGH (ref 3.5–5.0)
Alkaline Phosphatase: 98 U/L (ref 38–126)
Anion gap: 22 — ABNORMAL HIGH (ref 5–15)
BUN: 9 mg/dL (ref 6–20)
CO2: 26 mmol/L (ref 22–32)
Calcium: 9.2 mg/dL (ref 8.9–10.3)
Chloride: 81 mmol/L — ABNORMAL LOW (ref 101–111)
Creatinine, Ser: 0.78 mg/dL (ref 0.61–1.24)
GFR calc Af Amer: >60 mL/min (ref >60)
GFR calc non Af Amer: >60 mL/min (ref >60)
Glucose, Bld: 88 mg/dL (ref 65–99)
Potassium: 3.3 mmol/L — ABNORMAL LOW (ref 3.5–5.1)
Sodium: 129 mmol/L — ABNORMAL LOW (ref 135–145)
Total Bilirubin: 1.3 mg/dL — ABNORMAL HIGH (ref 0.3–1.2)
Total Protein: 8.5 g/dL — ABNORMAL HIGH (ref 6.5–8.1)

## 2015-02-28 LAB — RAPID URINE DRUG SCREEN, HOSP PERFORMED
Amphetamines: NOT DETECTED
Barbiturates: NOT DETECTED
Benzodiazepines: NOT DETECTED
Cocaine: NOT DETECTED
Opiates: NOT DETECTED
Tetrahydrocannabinol: POSITIVE — AB

## 2015-02-28 LAB — LIPASE, BLOOD: Lipase: 22 U/L (ref 22–51)

## 2015-02-28 LAB — ETHANOL: Alcohol, Ethyl (B): 272 mg/dL — ABNORMAL HIGH (ref <5)

## 2015-02-28 MED ORDER — LORAZEPAM 1 MG PO TABS: 1.0000 mg | ORAL_TABLET | Freq: Four times a day (QID) | ORAL | Status: DC | PRN

## 2015-02-28 MED ORDER — ALUM & MAG HYDROXIDE-SIMETH 200-200-20 MG/5ML PO SUSP: 30.0000 mL | ORAL | Status: DC | PRN

## 2015-02-28 MED ORDER — LORAZEPAM 1 MG PO TABS
1.0000 mg | ORAL_TABLET | Freq: Four times a day (QID) | ORAL | Status: DC
  Administered 2015-02-28 – 2015-03-01 (×4): 1 mg via ORAL
  Filled 2015-02-28 (×4): qty 1

## 2015-02-28 MED ORDER — LORAZEPAM 2 MG/ML IJ SOLN
0.0000 mg | Freq: Four times a day (QID) | INTRAMUSCULAR | Status: DC
  Administered 2015-02-28: 2 mg via INTRAVENOUS
  Filled 2015-02-28: qty 1

## 2015-02-28 MED ORDER — VITAMIN B-1 100 MG PO TABS: 100.0000 mg | ORAL_TABLET | Freq: Every day | ORAL | Status: DC

## 2015-02-28 MED ORDER — LORAZEPAM 1 MG PO TABS: 1.0000 mg | ORAL_TABLET | Freq: Three times a day (TID) | ORAL | Status: DC

## 2015-02-28 MED ORDER — ACETAMINOPHEN 325 MG PO TABS
650.0000 mg | ORAL_TABLET | Freq: Four times a day (QID) | ORAL | Status: DC | PRN
  Administered 2015-02-28: 650 mg via ORAL
  Filled 2015-02-28: qty 2

## 2015-02-28 MED ORDER — ONDANSETRON HCL 4 MG PO TABS: 4.0000 mg | ORAL_TABLET | Freq: Three times a day (TID) | ORAL | Status: DC | PRN

## 2015-02-28 MED ORDER — NICOTINE 21 MG/24HR TD PT24
21.0000 mg | MEDICATED_PATCH | Freq: Every day | TRANSDERMAL | Status: DC
  Administered 2015-02-28: 21 mg via TRANSDERMAL
  Filled 2015-02-28: qty 1

## 2015-02-28 MED ORDER — MAGNESIUM HYDROXIDE 400 MG/5ML PO SUSP: 30.0000 mL | Freq: Every day | ORAL | Status: DC | PRN

## 2015-02-28 MED ORDER — THIAMINE HCL 100 MG/ML IJ SOLN
100.0000 mg | Freq: Once | INTRAMUSCULAR | Status: AC
  Administered 2015-03-01: 100 mg via INTRAMUSCULAR
  Filled 2015-02-28 (×2): qty 2

## 2015-02-28 MED ORDER — LORAZEPAM 1 MG PO TABS
0.0000 mg | ORAL_TABLET | Freq: Four times a day (QID) | ORAL | Status: DC
  Administered 2015-02-28: 2 mg via ORAL
  Filled 2015-02-28: qty 2

## 2015-02-28 MED ORDER — LORAZEPAM 1 MG PO TABS: 1.0000 mg | ORAL_TABLET | Freq: Every day | ORAL | Status: DC

## 2015-02-28 MED ORDER — ADULT MULTIVITAMIN W/MINERALS CH
1.0000 | ORAL_TABLET | Freq: Every day | ORAL | Status: DC
  Administered 2015-03-01: 1 via ORAL
  Filled 2015-02-28: qty 1

## 2015-02-28 MED ORDER — HYDROXYZINE HCL 25 MG PO TABS
25.0000 mg | ORAL_TABLET | Freq: Four times a day (QID) | ORAL | Status: DC | PRN
  Administered 2015-02-28: 25 mg via ORAL
  Filled 2015-02-28: qty 1

## 2015-02-28 MED ORDER — SODIUM CHLORIDE 0.9 % IV BOLUS (SEPSIS)
1000.0000 mL | Freq: Once | INTRAVENOUS | Status: AC
  Administered 2015-02-28: 1000 mL via INTRAVENOUS

## 2015-02-28 MED ORDER — LOPERAMIDE HCL 2 MG PO CAPS: 2.0000 mg | ORAL_CAPSULE | ORAL | Status: DC | PRN

## 2015-02-28 MED ORDER — LORAZEPAM 2 MG/ML IJ SOLN
0.0000 mg | Freq: Two times a day (BID) | INTRAMUSCULAR | Status: DC
  Administered 2015-02-28: 2 mg via INTRAVENOUS
  Filled 2015-02-28: qty 1

## 2015-02-28 MED ORDER — THIAMINE HCL 100 MG/ML IJ SOLN
Freq: Once | INTRAVENOUS | Status: AC
  Administered 2015-02-28: 05:00:00 via INTRAVENOUS
  Filled 2015-02-28: qty 1000

## 2015-02-28 MED ORDER — ONDANSETRON HCL 4 MG/2ML IJ SOLN
4.0000 mg | Freq: Once | INTRAMUSCULAR | Status: AC
  Administered 2015-02-28: 4 mg via INTRAVENOUS
  Filled 2015-02-28: qty 2

## 2015-02-28 MED ORDER — LORAZEPAM 1 MG PO TABS: 1.0000 mg | ORAL_TABLET | Freq: Two times a day (BID) | ORAL | Status: DC

## 2015-02-28 MED ORDER — ONDANSETRON 4 MG PO TBDP
4.0000 mg | ORAL_TABLET | Freq: Four times a day (QID) | ORAL | Status: DC | PRN
  Administered 2015-02-28 – 2015-03-01 (×3): 4 mg via ORAL
  Filled 2015-02-28 (×3): qty 1

## 2015-02-28 NOTE — Progress Notes (Signed)
[facility] INPATIENT:  Family/Significant Other Suicide Prevention Education  Suicide Prevention Education:  Patient Refusal for Family/Significant Other Suicide Prevention Education: The patient [name] has refused to provide written consent for family/significant other to be provided Family/Significant Other Suicide Prevention Education during admission and/or prior to discharge.  Physician notified.  Debbrah Alar 02/28/2015, 3:41 PM
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
## 2015-02-28 NOTE — Progress Notes (Signed)
Patient in bed resting at the beginning of this shift. His mood and affects flat and depressed. He denied SI/HI and denied Hallucinations. He endorsed withdrawal symptoms; tremors, sweating, head ache, nausea and anxiety. Writer administered vistaril, Tylenol and Zofran. Encouraged and supported patient. Q 15 minute check continues as ordered for safety.

## 2015-02-28 NOTE — BH Assessment (Signed)
Patient accepted to the observation unit at [facility]. Patient accepted to the unit by Dr. Darleene Cleaver and Reginold Agent, NP. Patient's bed assignment is #7. Nursing report [phone]. The support paperwork is completed.

## 2015-02-28 NOTE — ED Notes (Signed)
Pt here for alcohol detox and suicidal thoughts, pt's last drink was yesterday evening, pt is starting to shake and he states that he's had seizures before. Pt states that he's vomited all day

## 2015-02-28 NOTE — Progress Notes (Signed)
Report given to Nurse at [facility] called for transport.

## 2015-02-28 NOTE — ED Notes (Signed)
Pt arrives to the ER via EMS for complaints of ETOH withdrawal; pt c/o nausea and vomiting; Pt denies SI / HI; pt reports that he is suicidal and what wants to "end it all"; pt denies SI plan

## 2015-02-28 NOTE — ED Notes (Signed)
Pt has been seen and wanded by security.   Pt's items have been documented.

## 2015-02-28 NOTE — Plan of Care (Signed)
[name] Observation Crisis Plan  Reason for Crisis Plan:  Crisis Stabilization and Substance Abuse   Plan of Care:  Referral for Substance Abuse  Family Support:      Current Living Environment:  Living Arrangements: Alone  Insurance:   Hospital Account    Name Acct ID Class Status Primary Coverage   [name], [name] [id number] [place] - MEDICARE PART A AND B        Guarantor Account (for Hospital Account [id number])    Name Relation to Pt Service Area Active? Acct Type   [name] Self V [facility] Yes Behavioral Health   Address Phone       [address]  [place], [place] [address] [phone])          Coverage Information (for Hospital Account [id number])    F/O Payor/Plan Precert #   MEDICARE/MEDICARE PART A AND B    Subscriber Subscriber #   [name], [name] [id number]   Address Phone   [address] [place], [place] [address]       Legal Guardian:     Primary Care Provider:  Minerva Ends, MD  Current Outpatient Providers:  [name]  Psychiatrist:     Counselor/Therapist:     Compliant with Medications:  Yes  Additional Information:   [name] 7/13/20163:38 PM

## 2015-02-28 NOTE — Progress Notes (Signed)
Patient ID: [name], male   DOB: 09/07/1970, 44 y.o.   MRN: [id number] PER STATE REGULATIONS 482.30  THIS CHART WAS REVIEWED FOR MEDICAL NECESSITY WITH RESPECT TO THE PATIENT'S ADMISSION/DURATION OF STAY.  NEXT REVIEW DATE: 03/04/15  Roma Schanz, RN, BSN CASE MANAGER
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
## 2015-02-28 NOTE — ED Notes (Signed)
Bed: WA17 Expected date:  Expected time:  Means of arrival:  Comments: EMS 44yo M  ETOH withdrawl

## 2015-02-28 NOTE — ED Provider Notes (Signed)
CSN: [id number]     Arrival date & time 02/28/15  0319 History   First MD Initiated Contact with Patient 02/28/15 0353     Chief Complaint  Patient presents with  . Alcohol Problem  . Suicidal     (Consider location/radiation/quality/duration/timing/severity/associated sxs/prior Treatment) Patient is a 43 y.o. male presenting with alcohol problem. The history is provided by the patient. No language interpreter was used.  Alcohol Problem This is a chronic problem. Associated symptoms include nausea and vomiting. Pertinent negatives include no chills or fever. Associated symptoms comments: Presents with feeling he is in alcohol withdrawal and afraid he will have a seizure. Last drank earlier today, an unknown quantity. He states he usually drinks a case of beer daily. He has been unable to drink as much as usual due to persistent vomiting throughout the day. No fever. Some diarrhea. He declines alcohol treatment. He reports he is suicidal and wants to kill himself. No formed plan. No HI, hallucinations..    Past Medical History  Diagnosis Date  . Psychosis   . PTSD (post-traumatic stress disorder)   . Seizure disorder     related to etoh seizure  . Alcohol abuse   . Schizoaffective disorder   . Anxiety     at age 55  . Depression     at age 60   Past Surgical History  Procedure Laterality Date  . Foot surgery      right  . Hand surgery     Family History  Problem Relation Age of Onset  . Alcoholism Mother   . CAD Father   . Depression Brother    History  Substance Use Topics  . Smoking status: Current Every Day Smoker -- 1.00 packs/day for 24 years    Types: Cigarettes  . Smokeless tobacco: Never Used  . Alcohol Use: No     Comment: "I'm a binge drinker.[name] 4-40 ounces/day"; last alcohol use 2 weeks ago (02/16/15)    Review of Systems  Constitutional: Negative for fever and chills.  HENT: Negative.   Respiratory: Negative.   Cardiovascular: Negative.    Gastrointestinal: Positive for nausea, vomiting and diarrhea. Negative for blood in stool.  Musculoskeletal: Negative.   Skin: Negative.   Neurological: Negative.   Psychiatric/Behavioral: Positive for suicidal ideas.      Allergies  Wellbutrin  Home Medications   Prior to Admission medications   Medication Sig Start Date End Date Taking? Authorizing Provider  acamprosate (CAMPRAL) 333 MG tablet Take 2 tablets (666 mg total) by mouth 3 (three) times daily with meals. 02/09/15  Yes Shuvon B Rankin, NP  allopurinol (ZYLOPRIM) 100 MG tablet Take 1 tablet (100 mg total) by mouth daily. 02/16/15  Yes Josalyn Funches, MD  famotidine (PEPCID) 20 MG tablet Take 1 tablet (20 mg total) by mouth 2 (two) times daily. Patient taking differently: Take 20 mg by mouth 2 (two) times daily as needed (for acid reflux).  02/09/15  Yes Shuvon B Rankin, NP  gabapentin (NEURONTIN) 400 MG capsule Take 1 capsule (400 mg total) by mouth 4 (four) times daily. 02/09/15  Yes Shuvon B Rankin, NP  naproxen (NAPROSYN) 500 MG tablet Take 1 tablet (500 mg total) by mouth 2 (two) times daily with a meal. Patient taking differently: Take 500 mg by mouth 2 (two) times daily as needed (for pain.).  02/16/15  Yes Josalyn Funches, MD  sertraline (ZOLOFT) 25 MG tablet Take 3 tablets (75 mg total) by mouth daily. 02/09/15  Yes Shuvon B  Rankin, NP  ziprasidone (GEODON) 60 MG capsule Take 1 capsule (60 mg total) by mouth 2 (two) times daily with a meal. 02/09/15  Yes Shuvon B Rankin, NP   BP 134/72 mmHg  Pulse 84  Temp(Src) 98.3 F (36.8 C)  Resp 18  SpO2 95% Physical Exam  Constitutional: He is oriented to person, place, and time. He appears well-developed and well-nourished.  HENT:  Head: Normocephalic.  Neck: Normal range of motion. Neck supple.  Cardiovascular: Normal rate and regular rhythm.   Pulmonary/Chest: Effort normal and breath sounds normal.  Abdominal: Soft. Bowel sounds are normal. There is no tenderness. There  is no rebound and no guarding.  Musculoskeletal: Normal range of motion.  Neurological: He is alert and oriented to person, place, and time.  Tremulous.  Skin: Skin is warm and dry. No rash noted.  Psychiatric: He expresses suicidal ideation.    ED Course  Procedures (including critical care time) Labs Review Labs Reviewed  COMPREHENSIVE METABOLIC PANEL - Abnormal; Notable for the following:    Sodium 129 (*)    Potassium 3.3 (*)    Chloride 81 (*)    Total Protein 8.5 (*)    Albumin 5.4 (*)    AST 65 (*)    Total Bilirubin 1.3 (*)    Anion gap 22 (*)    All other components within normal limits  CBC WITH DIFFERENTIAL/PLATELET - Abnormal; Notable for the following:    Monocytes Relative 17 (*)    All other components within normal limits  URINE RAPID DRUG SCREEN, HOSP PERFORMED - Abnormal; Notable for the following:    Tetrahydrocannabinol POSITIVE (*)    All other components within normal limits  ETHANOL - Abnormal; Notable for the following:    Alcohol, Ethyl (B) 272 (*)    All other components within normal limits  LIPASE, BLOOD    Imaging Review No results found.   EKG Interpretation None      MDM   Final diagnoses:  None    1. Alcohol withdrawal 2. Suicidal ideation 3. Alcohol dependence  VS improved with IV fluids. Tachycardia resolved. Do not feel he DT's eminent.  Will medically clear for TTS consultation in evaluation of SI.  in  Charlann Lange, PA-C 02/28/15 [place], DO 02/28/15 [id number]

## 2015-02-28 NOTE — ED Notes (Signed)
Upon introducing self to pt pt denies SI/HI; pt continues to rest will complete assessment post pt resting.
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
## 2015-03-01 DIAGNOSIS — F39 Unspecified mood [affective] disorder: Secondary | ICD-10-CM | POA: Diagnosis not present

## 2015-03-01 DIAGNOSIS — F1024 Alcohol dependence with alcohol-induced mood disorder: Secondary | ICD-10-CM

## 2015-03-01 MED ORDER — GABAPENTIN 400 MG PO CAPS: 400.0000 mg | ORAL_CAPSULE | Freq: Four times a day (QID) | ORAL | Status: DC

## 2015-03-01 MED ORDER — ALLOPURINOL 100 MG PO TABS: 100.0000 mg | ORAL_TABLET | Freq: Every day | ORAL | Status: DC

## 2015-03-01 MED ORDER — ZIPRASIDONE HCL 60 MG PO CAPS: 60.0000 mg | ORAL_CAPSULE | Freq: Two times a day (BID) | ORAL | Status: DC

## 2015-03-01 MED ORDER — ACAMPROSATE CALCIUM 333 MG PO TBEC: 666.0000 mg | DELAYED_RELEASE_TABLET | Freq: Three times a day (TID) | ORAL | Status: DC

## 2015-03-01 MED ORDER — ALLOPURINOL 100 MG PO TABS
100.0000 mg | ORAL_TABLET | Freq: Every day | ORAL | Status: DC
  Administered 2015-03-01: 100 mg via ORAL
  Filled 2015-03-01 (×2): qty 1

## 2015-03-01 MED ORDER — SERTRALINE HCL 50 MG PO TABS
75.0000 mg | ORAL_TABLET | Freq: Every day | ORAL | Status: DC
  Administered 2015-03-01: 75 mg via ORAL

## 2015-03-01 MED ORDER — GABAPENTIN 400 MG PO CAPS
400.0000 mg | ORAL_CAPSULE | Freq: Four times a day (QID) | ORAL | Status: DC
  Administered 2015-03-01: 400 mg via ORAL

## 2015-03-01 MED ORDER — ZIPRASIDONE HCL 60 MG PO CAPS
60.0000 mg | ORAL_CAPSULE | Freq: Two times a day (BID) | ORAL | Status: DC
  Administered 2015-03-01: 60 mg via ORAL

## 2015-03-01 MED ORDER — ACAMPROSATE CALCIUM 333 MG PO TBEC
666.0000 mg | DELAYED_RELEASE_TABLET | Freq: Three times a day (TID) | ORAL | Status: DC
  Administered 2015-03-01: 666 mg via ORAL
  Filled 2015-03-01 (×3): qty 2

## 2015-03-01 MED ORDER — SERTRALINE HCL 25 MG PO TABS: 75.0000 mg | ORAL_TABLET | Freq: Every day | ORAL | Status: DC

## 2015-03-01 NOTE — Progress Notes (Signed)
Patient ID: [name], male   DOB: 12-24-1970, 44 y.o.   MRN: [id number] Discharge Note-Seen by Eston Mould. NP re discharge for today. He has meds at home, and verified with [facility] team he has an appt with them and their Dr on 03/27/2015 at 220 pm. 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 He was to come up and speak with client before discharge. He did not come or contact [facility] in an hour, called downstairs to his office here in the building and told by his office mate he was out on an errand Left message for him to call the office. Another hour has passed without hearing from him, so discharged client with information re his appt on the 9th and to call [facility] or walk in between 8 am and 3 pm if needs anything before his scheduled appt. Gave him his ordered medications before leaving. No Rxs given since he has meds as home per [facility] his outpatient provider. Given a bus ticket to get home from here. He contacted his mom before leaving to update her. He states he feels much better than one admission. He denies any thoughts to hurt self or others. His CIWA at 1116 today was a 3. All property returned to him and escorted to lobby for discharge.

## 2015-03-01 NOTE — Progress Notes (Signed)
Nicotine patch removed. Pt says he is feeling better. CIWA 3. He expressed interest in discharge. Will continue to monitor for needs/safety.
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
## 2015-03-01 NOTE — Progress Notes (Signed)
Draysen was calm and cooperative upon approach, with a blunted affect. He denied SI/HI/AVH and admitted a headache (5/10). He declined Tylenol but did take PRN Zofran for nausea. He was able to eat at least half of his lunch and kept PO meds down w/o difficulty. He tolerated a B-1 injection to the right deltoid well. He contracted for safety. At present, he is resting with eyes closed. Urged him to approach staff with concerns/questions. Will monitor for needs/safety.
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
## 2015-03-01 NOTE — BH Assessment (Signed)
Patient slept through the night. He reports that he is doing better this morning. Writer encouraged patient to take his shower and eat his breakfast this morning. He reports less symptoms of withdrawals and states he is doing better. His V/S are within normal limits this morning. Q 15 minute check continues for safety.

## 2015-03-03 NOTE — Discharge Instructions (Signed)
TRANSFER TO [facility] OBSERVATION UNIT

## 2015-03-11 ENCOUNTER — Emergency Department (HOSPITAL_COMMUNITY)
Admission: EM | Admit: 2015-03-11 | Discharge: 2015-03-12 | Disposition: A | Discharge status: Home / Self Care | Payer: Medicare Other | Attending: Emergency Medicine | Admitting: Emergency Medicine

## 2015-03-11 ENCOUNTER — Encounter (HOSPITAL_COMMUNITY): Payer: Self-pay

## 2015-03-11 DIAGNOSIS — F4310 Post-traumatic stress disorder, unspecified: Secondary | ICD-10-CM | POA: Diagnosis not present

## 2015-03-11 DIAGNOSIS — G40909 Epilepsy, unspecified, not intractable, without status epilepticus: Secondary | ICD-10-CM | POA: Insufficient documentation

## 2015-03-11 DIAGNOSIS — R251 Tremor, unspecified: Secondary | ICD-10-CM | POA: Insufficient documentation

## 2015-03-11 DIAGNOSIS — F1210 Cannabis abuse, uncomplicated: Secondary | ICD-10-CM | POA: Diagnosis not present

## 2015-03-11 DIAGNOSIS — R45851 Suicidal ideations: Secondary | ICD-10-CM

## 2015-03-11 DIAGNOSIS — F259 Schizoaffective disorder, unspecified: Secondary | ICD-10-CM | POA: Insufficient documentation

## 2015-03-11 DIAGNOSIS — Z791 Long term (current) use of non-steroidal anti-inflammatories (NSAID): Secondary | ICD-10-CM | POA: Insufficient documentation

## 2015-03-11 DIAGNOSIS — Z720 Tobacco use: Secondary | ICD-10-CM | POA: Insufficient documentation

## 2015-03-11 DIAGNOSIS — F329 Major depressive disorder, single episode, unspecified: Secondary | ICD-10-CM | POA: Insufficient documentation

## 2015-03-11 DIAGNOSIS — Z79899 Other long term (current) drug therapy: Secondary | ICD-10-CM | POA: Insufficient documentation

## 2015-03-11 DIAGNOSIS — F419 Anxiety disorder, unspecified: Secondary | ICD-10-CM | POA: Insufficient documentation

## 2015-03-11 DIAGNOSIS — F1010 Alcohol abuse, uncomplicated: Secondary | ICD-10-CM | POA: Insufficient documentation

## 2015-03-11 DIAGNOSIS — M109 Gout, unspecified: Secondary | ICD-10-CM

## 2015-03-11 DIAGNOSIS — R110 Nausea: Secondary | ICD-10-CM | POA: Insufficient documentation

## 2015-03-11 LAB — COMPREHENSIVE METABOLIC PANEL
ALT: 39 U/L (ref 17–63)
AST: 29 U/L (ref 15–41)
Albumin: 4.5 g/dL (ref 3.5–5.0)
Alkaline Phosphatase: 80 U/L (ref 38–126)
Anion gap: 18 — ABNORMAL HIGH (ref 5–15)
BUN: 15 mg/dL (ref 6–20)
CO2: 24 mmol/L (ref 22–32)
Calcium: 9.7 mg/dL (ref 8.9–10.3)
Chloride: 100 mmol/L — ABNORMAL LOW (ref 101–111)
Creatinine, Ser: 0.81 mg/dL (ref 0.61–1.24)
GFR calc Af Amer: >60 mL/min (ref >60)
GFR calc non Af Amer: >60 mL/min (ref >60)
Glucose, Bld: 103 mg/dL — ABNORMAL HIGH (ref 65–99)
Potassium: 3.1 mmol/L — ABNORMAL LOW (ref 3.5–5.1)
Sodium: 142 mmol/L (ref 135–145)
Total Bilirubin: 0.6 mg/dL (ref 0.3–1.2)
Total Protein: 7.9 g/dL (ref 6.5–8.1)

## 2015-03-11 LAB — RAPID URINE DRUG SCREEN, HOSP PERFORMED
Amphetamines: NOT DETECTED
Barbiturates: NOT DETECTED
Benzodiazepines: NOT DETECTED
Cocaine: NOT DETECTED
Opiates: NOT DETECTED
Tetrahydrocannabinol: POSITIVE — AB

## 2015-03-11 LAB — CBC WITH DIFFERENTIAL/PLATELET
Basophils Absolute: 0 10*3/uL (ref 0.0–0.1)
Basophils Relative: 0 % (ref 0–1)
Eosinophils Absolute: 0 10*3/uL (ref 0.0–0.7)
Eosinophils Relative: 0 % (ref 0–5)
HCT: 44 % (ref 39.0–52.0)
Hemoglobin: 14.9 g/dL (ref 13.0–17.0)
Lymphocytes Relative: 19 % (ref 12–46)
Lymphs Abs: 2.1 10*3/uL (ref 0.7–4.0)
MCH: 31 pg (ref 26.0–34.0)
MCHC: 33.9 g/dL (ref 30.0–36.0)
MCV: 91.7 fL (ref 78.0–100.0)
Monocytes Absolute: 1.1 10*3/uL — ABNORMAL HIGH (ref 0.1–1.0)
Monocytes Relative: 10 % (ref 3–12)
Neutro Abs: 7.6 10*3/uL (ref 1.7–7.7)
Neutrophils Relative %: 71 % (ref 43–77)
Platelets: 424 10*3/uL — ABNORMAL HIGH (ref 150–400)
RBC: 4.8 MIL/uL (ref 4.22–5.81)
RDW: 14.8 % (ref 11.5–15.5)
WBC: 10.8 10*3/uL — ABNORMAL HIGH (ref 4.0–10.5)

## 2015-03-11 LAB — ETHANOL: Alcohol, Ethyl (B): 85 mg/dL — ABNORMAL HIGH (ref <5)

## 2015-03-11 MED ORDER — LORAZEPAM 1 MG PO TABS
0.0000 mg | ORAL_TABLET | Freq: Two times a day (BID) | ORAL | Status: DC
  Administered 2015-03-12: 1 mg via ORAL
  Filled 2015-03-11: qty 1

## 2015-03-11 MED ORDER — LORAZEPAM 2 MG/ML IJ SOLN
0.0000 mg | Freq: Two times a day (BID) | INTRAMUSCULAR | Status: DC
  Administered 2015-03-11: 1 mg via INTRAVENOUS
  Filled 2015-03-11: qty 1

## 2015-03-11 MED ORDER — VITAMIN B-1 100 MG PO TABS
100.0000 mg | ORAL_TABLET | Freq: Every day | ORAL | Status: DC
  Administered 2015-03-12: 100 mg via ORAL
  Filled 2015-03-11: qty 1

## 2015-03-11 MED ORDER — LORAZEPAM 2 MG/ML IJ SOLN: 0.0000 mg | Freq: Four times a day (QID) | INTRAMUSCULAR | Status: DC

## 2015-03-11 MED ORDER — THIAMINE HCL 100 MG/ML IJ SOLN
100.0000 mg | Freq: Every day | INTRAMUSCULAR | Status: DC
  Administered 2015-03-11: 100 mg via INTRAVENOUS
  Filled 2015-03-11: qty 2

## 2015-03-11 MED ORDER — LORAZEPAM 1 MG PO TABS
0.0000 mg | ORAL_TABLET | Freq: Four times a day (QID) | ORAL | Status: DC
Start: 2015-03-12 — End: 2015-03-12
  Administered 2015-03-12: 1 mg via ORAL
  Filled 2015-03-11: qty 1

## 2015-03-11 MED ORDER — SODIUM CHLORIDE 0.9 % IV BOLUS (SEPSIS)
1000.0000 mL | Freq: Once | INTRAVENOUS | Status: AC
Start: 2015-03-11 — End: 2015-03-11
  Administered 2015-03-11: 1000 mL via INTRAVENOUS

## 2015-03-11 NOTE — ED Notes (Signed)
Staffing notified of need for sitter for patient.

## 2015-03-11 NOTE — ED Notes (Signed)
Pt reports he typically drinks 1 case of beer every day. He reports unknown amount of alcohol and last drink at unknown time. He is c/o shaking, nausea, and a headache.

## 2015-03-11 NOTE — BH Assessment (Signed)
Assessment completed. Consulted Arlester Marker, NP who recommended inpatient treatment. Dr. Tawnya Crook has been informed of the recommendation. TTS will contact other facilities for placement.

## 2015-03-11 NOTE — ED Notes (Signed)
Pt presents with c/o alcohol problem and suicidal thoughts. Pt reports his last drink was today. Pt is red in the face and is experiencing tremors at this time. Pt reports he had a knife earlier today and was going to cut himself.

## 2015-03-11 NOTE — ED Notes (Signed)
Associate Professor at bedside.

## 2015-03-11 NOTE — ED Notes (Addendum)
MD Docherty at bedside. 

## 2015-03-11 NOTE — ED Provider Notes (Signed)
CSN: [id number]     Arrival date & time 03/11/15  2010 History   First MD Initiated Contact with Patient 03/11/15 2028     Chief Complaint  Patient presents with  . Alcohol Problem  . Suicidal     (Consider location/radiation/quality/duration/timing/severity/associated sxs/prior Treatment) HPI Comments: Pt reports drinking a case of beer every day for the past week.  He has no prior history of long-standing alcohol abuse His last bear with this afternoon and he is now having shaking chills and nausea also reporting suicidal ideation with thoughts about cutting his neck with a knife.  He is chronic AV hallucinations since being diagnosed with schizoaffective disorder and his 48s.  He denies HI.   Patient is a 44 y.o. male presenting with alcohol problem. The history is provided by the patient. No language interpreter was used.  Alcohol Problem This is a new problem. Episode onset: 1 wk. The problem occurs constantly. The problem has not changed since onset.Pertinent negatives include no chest pain, no abdominal pain, no headaches and no shortness of breath. Associated symptoms comments: tremors this afternoon. Nothing aggravates the symptoms. Nothing relieves the symptoms. He has tried nothing for the symptoms. The treatment provided no relief.    Past Medical History  Diagnosis Date  . Psychosis   . PTSD (post-traumatic stress disorder)   . Seizure disorder     related to etoh seizure  . Alcohol abuse   . Schizoaffective disorder   . Anxiety     at age 48  . Depression     at age 14   Past Surgical History  Procedure Laterality Date  . Foot surgery      right  . Hand surgery     Family History  Problem Relation Age of Onset  . Alcoholism Mother   . CAD Father   . Depression Brother    History  Substance Use Topics  . Smoking status: Current Every Day Smoker -- 1.00 packs/day for 24 years    Types: Cigarettes  . Smokeless tobacco: Never Used  . Alcohol Use: No   Comment: "I'm a binge drinker.[name] 4-40 ounces/day"; last alcohol use 2 weeks ago (02/16/15)    Review of Systems  Constitutional: Negative for fever, activity change, appetite change and fatigue.  HENT: Negative for congestion, facial swelling, rhinorrhea and trouble swallowing.   Eyes: Negative for photophobia and pain.  Respiratory: Negative for cough, chest tightness and shortness of breath.   Cardiovascular: Negative for chest pain and leg swelling.  Gastrointestinal: Positive for nausea. Negative for vomiting, abdominal pain, diarrhea and constipation.  Endocrine: Negative for polydipsia and polyuria.  Genitourinary: Negative for dysuria, urgency, decreased urine volume and difficulty urinating.  Musculoskeletal: Negative for back pain and gait problem.  Skin: Negative for color change, rash and wound.  Allergic/Immunologic: Negative for immunocompromised state.  Neurological: Positive for tremors. Negative for dizziness, facial asymmetry, speech difficulty, weakness, numbness and headaches.  Psychiatric/Behavioral: Negative for confusion, decreased concentration and agitation.      Allergies  Wellbutrin  Home Medications   Prior to Admission medications   Medication Sig Start Date End Date Taking? Authorizing Provider  acamprosate (CAMPRAL) 333 MG tablet Take 2 tablets (666 mg total) by mouth 3 (three) times daily with meals. 03/01/15  Yes Benjamine Mola, FNP  allopurinol (ZYLOPRIM) 100 MG tablet Take 1 tablet (100 mg total) by mouth daily. 03/01/15  Yes Benjamine Mola, FNP  famotidine (PEPCID) 20 MG tablet Take 1 tablet (20 mg  total) by mouth 2 (two) times daily. Patient taking differently: Take 20 mg by mouth 2 (two) times daily as needed (for acid reflux).  02/09/15  Yes Shuvon B Rankin, NP  gabapentin (NEURONTIN) 400 MG capsule Take 1 capsule (400 mg total) by mouth 4 (four) times daily. 03/01/15  Yes Benjamine Mola, FNP  naproxen (NAPROSYN) 500 MG tablet Take 1 tablet (500 mg  total) by mouth 2 (two) times daily with a meal. Patient taking differently: Take 500 mg by mouth 2 (two) times daily as needed (for pain.).  02/16/15  Yes Josalyn Funches, MD  sertraline (ZOLOFT) 25 MG tablet Take 3 tablets (75 mg total) by mouth daily. 03/01/15  Yes Benjamine Mola, FNP  ziprasidone (GEODON) 60 MG capsule Take 1 capsule (60 mg total) by mouth 2 (two) times daily with a meal. 03/01/15  Yes John C Withrow, FNP   BP 138/84 mmHg  Pulse 91  Temp(Src) 98.4 F (36.9 C) (Oral)  Resp 21  SpO2 94% Physical Exam  Constitutional: He is oriented to person, place, and time. He appears well-developed and well-nourished. No distress.  HENT:  Head: Normocephalic and atraumatic.  Mouth/Throat: No oropharyngeal exudate.  Eyes: Pupils are equal, round, and reactive to light.  Neck: Normal range of motion. Neck supple.  Cardiovascular: Normal rate, regular rhythm and normal heart sounds.  Exam reveals no gallop and no friction rub.   No murmur heard. Pulmonary/Chest: Effort normal and breath sounds normal. No respiratory distress. He has no wheezes. He has no rales.  Abdominal: Soft. Bowel sounds are normal. He exhibits no distension and no mass. There is no tenderness. There is no rebound and no guarding.  Musculoskeletal: Normal range of motion. He exhibits no edema or tenderness.  Neurological: He is alert and oriented to person, place, and time.  Tremors,distractible  Skin: Skin is warm and dry.  Psychiatric: He has a normal mood and affect.    ED Course  Procedures (including critical care time) Labs Review Labs Reviewed  CBC WITH DIFFERENTIAL/PLATELET - Abnormal; Notable for the following:    WBC 10.8 (*)    Platelets 424 (*)    Monocytes Absolute 1.1 (*)    All other components within normal limits  COMPREHENSIVE METABOLIC PANEL - Abnormal; Notable for the following:    Potassium 3.1 (*)    Chloride 100 (*)    Glucose, Bld 103 (*)    Anion gap 18 (*)    All other components  within normal limits  URINE RAPID DRUG SCREEN, HOSP PERFORMED - Abnormal; Notable for the following:    Tetrahydrocannabinol POSITIVE (*)    All other components within normal limits  ETHANOL - Abnormal; Notable for the following:    Alcohol, Ethyl (B) 85 (*)    All other components within normal limits    Imaging Review No results found.   EKG Interpretation None      MDM   Final diagnoses:  Suicide ideation  Alcohol abuse    Pt is a 44 y.o. male with Pmhx as above who presents with report of alcohol abuse as well as suicidal ideation.  Patient states he's been drinking about a case of beer a day for the past week.  States that his last beer was this afternoon that he feels like he is withdrawing from [place] he has had upset stomach and tremor.  He states that he has been having suicidal thoughts for the past week and has been thinking about cutting himself  in the neck with a knife.  He denies HI.  He states he has chronic AV hallucinations since his 23s when he was diagnosed with schizoaffective disorder.  On physical exam, patient is tremulous, although this is distractible with speech.  He is not diaphoretic, and heart rate is normal.  Alcohol level is 85 and given last drink is been this afternoon and he has not been a long-standing heavy drinker, There is no significant concern for onset of DTs. CIWA protocol will be initiated.   Pt meeting inpt criteria per TTS.     Ernestina Patches, MD 03/11/15 [id number]

## 2015-03-11 NOTE — ED Notes (Signed)
Pt attempting to provide a urine sample.

## 2015-03-11 NOTE — ED Notes (Signed)
Bed: [facility] Expected date:  Expected time:  Means of arrival:  Comments: 

## 2015-03-12 DIAGNOSIS — R45851 Suicidal ideations: Secondary | ICD-10-CM | POA: Insufficient documentation

## 2015-03-12 DIAGNOSIS — F1010 Alcohol abuse, uncomplicated: Secondary | ICD-10-CM | POA: Insufficient documentation

## 2015-03-12 DIAGNOSIS — F330 Major depressive disorder, recurrent, mild: Secondary | ICD-10-CM | POA: Insufficient documentation

## 2015-03-12 MED ORDER — ZIPRASIDONE HCL 60 MG PO CAPS: 60.0000 mg | ORAL_CAPSULE | Freq: Two times a day (BID) | ORAL | Status: DC

## 2015-03-12 MED ORDER — SERTRALINE HCL 25 MG PO TABS: 75.0000 mg | ORAL_TABLET | Freq: Every day | ORAL | Status: DC

## 2015-03-12 NOTE — Progress Notes (Signed)
Pt d/c to home with plans to go to [facility] outpatient help. Pt discharged with information for outpatient appt and bus pass. Pt escorted off of unit to exit by PCT [facility]. VSS upon d/c and pt is no pain or distress.

## 2015-03-12 NOTE — ED Notes (Addendum)
Pt ambulated to restroom with steady gait. Sitter with patient.

## 2015-03-12 NOTE — ED Notes (Signed)
Report given to [facility] in [place]. Pt transferring to 31.

## 2015-03-12 NOTE — BHH Suicide Risk Assessment (Addendum)
[facility] Discharge Suicide Risk Assessment  [name] is an 44 y.o. male who presented to the [facility] for suicidal ideation along with substance use. Patient at that time threatened to stab himself with a knife in the neck as he reported he was suicidal due to being intoxicated.  Patient is morning states that he's no longer suicidal or homicidal, adds that he wants treatment for his alcohol use disorder, is willing to do CD IOP. Patient states that he wants to stay clean, have outpatient help, adds that his mom plans to move in with him in the next few days to help him stay clean. He states that he is a transitional care team through [facility] and will continue to follow-up with them. He has that it was just a relapse, knows that he needs to work on staying clean. He denies feeling depressed at this time, denies having any thoughts of hurting himself or others. He states that his sister is going to be upset with him but adds that she is supportive. He adds that he's okay with contacting his sister to let her know that he is being discharged today  Patient currently denies any aggravating or relieving factors. He denies any symptoms of mania, psychosis, any other complaints at this visit.  Axis I: Major Depression, Recurrent severe and Alcohol intoxication with moderate or severe use disorder Demographic Factors:  Male, Low socioeconomic status and Living alone  Total Time spent with patient: 30 minutes  Musculoskeletal: Strength & Muscle Tone: within normal limits Gait & Station: normal Patient leans: N/A  Psychiatric Specialty Exam: Physical Exam  Review of Systems  Constitutional: Negative.  Negative for fever, chills, weight loss and malaise/fatigue.  HENT: Negative.  Negative for congestion, hearing loss and sore throat.   Eyes: Negative.  Negative for blurred vision and double vision.  Respiratory: Negative.  Negative for cough.   Cardiovascular: Negative.  Negative for chest  pain and palpitations.  Gastrointestinal: Negative.  Negative for heartburn, nausea, vomiting and abdominal pain.  Genitourinary: Negative.  Negative for dysuria.  Musculoskeletal: Negative.  Negative for myalgias and falls.  Skin: Negative.  Negative for rash.  Neurological: Negative.  Negative for dizziness, tingling, seizures, loss of consciousness and headaches.  Endo/Heme/Allergies: Negative.  Negative for environmental allergies.  Psychiatric/Behavioral: Positive for substance abuse. Negative for depression, suicidal ideas, hallucinations and memory loss. The patient is not nervous/anxious and does not have insomnia.     Blood pressure 131/90, pulse 69, temperature 97.9 F (36.6 C), temperature source Oral, resp. rate 18, SpO2 98 %.There is no weight on file to calculate BMI.  General Appearance: Disheveled  Eye Sport and exercise psychologist::  Fair  Speech:  Clear and Coherent and Normal Rate409  Volume:  Normal  Mood:  Anxious and Euthymic  Affect:  Appropriate, Congruent and Full Range  Thought Process:  Coherent and Intact  Orientation:  Full (Time, Place, and Person)  Thought Content:  WDL  Suicidal Thoughts:  No  Homicidal Thoughts:  No  Memory:  Immediate;   Fair Recent;   Fair Remote;   Fair  Judgement:  Impaired  Insight:  Shallow  Psychomotor Activity:  Mannerisms  Concentration:  Fair  Recall:  Poor  Fund of Knowledge:Fair  Language: Fair  Akathisia:  No  Handed:  Right  AIMS (if indicated):     Assets:  Housing Social Support  Sleep:     Cognition: WNL  ADL's:  Intact      Has this patient used any  form of tobacco in the last 30 days? (Cigarettes, Smokeless Tobacco, Cigars, and/or Pipes) Yes, Prescription not provided because: as patient refuses  Mental Status Per Nursing Assessment::   On Admission:     Current Mental Status by Physician: NA  Loss Factors: substance use  Historical Factors: Family history of mental illness or substance abuse and Impulsivity  Risk  Reduction Factors:   Sense of responsibility to family and Positive social support  Continued Clinical Symptoms:  Alcohol/Substance Abuse/Dependencies More than one psychiatric diagnosis Unstable or Poor Therapeutic Relationship Previous Psychiatric Diagnoses and Treatments  Cognitive Features That Contribute To Risk:  None    Suicide Risk:  Minimal: No identifiable suicidal ideation.  Patients presenting with no risk factors but with morbid ruminations; may be classified as minimal risk based on the severity of the depressive symptoms  Principal Problem: <principal problem not specified> Discharge Diagnoses:  Patient Active Problem List   Diagnosis Date Noted  . Gout [M10.9] 02/16/2015  . Severe recurrent major depression with psychotic features [F33.3] 02/06/2015  . Alcohol dependence with alcohol-induced mood disorder [F10.24]   . Alcohol withdrawal [F10.239] 12/25/2014  . Schizoaffective disorder, depressive type [F25.1]   . Alcohol use disorder, severe, dependence [F10.20] 11/29/2014  . Current smoker [Z72.0] 11/20/2014  . Tremors [G25.2] 11/03/2014  . Chronic posttraumatic stress disorder [F43.12]   . Protein-calorie malnutrition, severe [E43] 10/19/2014  . Hallucinations, visual [R44.1]   . Noncompliance with medication regimen [Z91.14]   . Hallucinations [R44.3] 07/07/2014  . Paranoia [F22] 07/07/2014  . Cannabis abuse [F12.10] 12/30/2013  . Schizoaffective disorder [F25.9] 12/29/2013  . Post traumatic stress disorder (PTSD) [F43.10] 11/03/2011      Plan Of Care/Follow-up recommendations:  Activity:  as tolerated Diet:  regular Other:  follow up at CD IOP  Is patient on multiple antipsychotic therapies at discharge:  No   Has Patient had three or more failed trials of antipsychotic monotherapy by history:  No  Recommended Plan for Multiple Antipsychotic Therapies: NA    [name] 03/12/2015, 11:30 AM

## 2015-03-12 NOTE — BHH Counselor (Signed)
This Probation officer faxed out supporting documentation to the following facilities for patient placement:  [facility], [address], [place], [place], [place], [place] OBS Counselor

## 2015-03-12 NOTE — ED Notes (Signed)
Radiographer, therapeutic at bedside.

## 2015-03-12 NOTE — ED Notes (Signed)
Pt awake and asking for "more medicine". Patient made aware that another CIWA will be performed in one hour. Pt calm and cooperative. No vomiting and patient appears to be comfortable. Will continue to monitor. Sitter Joellen Jersey is at bedside.
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
## 2015-03-12 NOTE — BH Assessment (Signed)
Per Dr. [name], patient does not meet criteria for inpatient hospitalization.  Patient denies SI/HI/Psychosis.  Patient reports that, "he messed up and used again".  Patient reports that he has family supports with his sister [name] [phone]).  Patient reports that he also receives services with the Transitional Care Team at [facility].    Per Dr. [name] - patient will be discharged and he will receive services [facility] CD-IOP on March 19, 2015 at 10am.

## 2015-03-12 NOTE — ED Notes (Addendum)
Pt ambulated to restroom with steady gait. Radiographer, therapeutic with patient.

## 2015-03-12 NOTE — ED Notes (Addendum)
Pt resting comfortably. No shaking/tremors noted.  Sitter remains at bedside.

## 2015-03-16 ENCOUNTER — Ambulatory Visit: Payer: Medicare Other | Attending: Family Medicine

## 2015-03-16 DIAGNOSIS — M109 Gout, unspecified: Secondary | ICD-10-CM

## 2015-03-16 LAB — CBC WITH DIFFERENTIAL/PLATELET
Basophils Absolute: 0.1 10*3/uL (ref 0.0–0.1)
Basophils Relative: 1 % (ref 0–1)
Eosinophils Absolute: 0.1 10*3/uL (ref 0.0–0.7)
Eosinophils Relative: 1 % (ref 0–5)
HCT: 40.6 % (ref 39.0–52.0)
Hemoglobin: 13.8 g/dL (ref 13.0–17.0)
Lymphocytes Relative: 24 % (ref 12–46)
Lymphs Abs: 1.7 10*3/uL (ref 0.7–4.0)
MCH: 31.1 pg (ref 26.0–34.0)
MCHC: 34 g/dL (ref 30.0–36.0)
MCV: 91.4 fL (ref 78.0–100.0)
MPV: 9.5 fL (ref 8.6–12.4)
Monocytes Absolute: 0.6 10*3/uL (ref 0.1–1.0)
Monocytes Relative: 8 % (ref 3–12)
Neutro Abs: 4.6 10*3/uL (ref 1.7–7.7)
Neutrophils Relative %: 66 % (ref 43–77)
Platelets: 409 10*3/uL — ABNORMAL HIGH (ref 150–400)
RBC: 4.44 MIL/uL (ref 4.22–5.81)
RDW: 15.1 % (ref 11.5–15.5)
WBC: 6.9 10*3/uL (ref 4.0–10.5)

## 2015-03-16 LAB — URIC ACID: Uric Acid, Serum: 6.8 mg/dL (ref 4.0–7.8)

## 2015-03-27 DIAGNOSIS — F250 Schizoaffective disorder, bipolar type: Secondary | ICD-10-CM | POA: Diagnosis not present

## 2015-03-27 DIAGNOSIS — F251 Schizoaffective disorder, depressive type: Secondary | ICD-10-CM | POA: Diagnosis not present

## 2015-04-08 ENCOUNTER — Encounter (HOSPITAL_COMMUNITY): Payer: Self-pay | Admitting: *Deleted

## 2015-04-08 ENCOUNTER — Emergency Department (HOSPITAL_COMMUNITY)
Admission: EM | Admit: 2015-04-08 | Discharge: 2015-04-09 | Disposition: A | Discharge status: Home / Self Care | Payer: Medicare Other | Attending: Emergency Medicine | Admitting: Emergency Medicine

## 2015-04-08 DIAGNOSIS — R000 Tachycardia, unspecified: Secondary | ICD-10-CM | POA: Insufficient documentation

## 2015-04-08 DIAGNOSIS — F333 Major depressive disorder, recurrent, severe with psychotic symptoms: Secondary | ICD-10-CM | POA: Diagnosis present

## 2015-04-08 DIAGNOSIS — F1024 Alcohol dependence with alcohol-induced mood disorder: Secondary | ICD-10-CM | POA: Diagnosis present

## 2015-04-08 DIAGNOSIS — Z720 Tobacco use: Secondary | ICD-10-CM | POA: Insufficient documentation

## 2015-04-08 DIAGNOSIS — F419 Anxiety disorder, unspecified: Secondary | ICD-10-CM | POA: Diagnosis not present

## 2015-04-08 DIAGNOSIS — F29 Unspecified psychosis not due to a substance or known physiological condition: Secondary | ICD-10-CM | POA: Diagnosis not present

## 2015-04-08 DIAGNOSIS — F329 Major depressive disorder, single episode, unspecified: Secondary | ICD-10-CM | POA: Diagnosis not present

## 2015-04-08 DIAGNOSIS — F4310 Post-traumatic stress disorder, unspecified: Secondary | ICD-10-CM | POA: Insufficient documentation

## 2015-04-08 DIAGNOSIS — R45851 Suicidal ideations: Secondary | ICD-10-CM

## 2015-04-08 DIAGNOSIS — R251 Tremor, unspecified: Secondary | ICD-10-CM | POA: Insufficient documentation

## 2015-04-08 DIAGNOSIS — Z79899 Other long term (current) drug therapy: Secondary | ICD-10-CM | POA: Diagnosis not present

## 2015-04-08 DIAGNOSIS — F1210 Cannabis abuse, uncomplicated: Secondary | ICD-10-CM | POA: Diagnosis not present

## 2015-04-08 DIAGNOSIS — F259 Schizoaffective disorder, unspecified: Secondary | ICD-10-CM | POA: Insufficient documentation

## 2015-04-08 DIAGNOSIS — G40909 Epilepsy, unspecified, not intractable, without status epilepticus: Secondary | ICD-10-CM | POA: Diagnosis not present

## 2015-04-08 DIAGNOSIS — F1020 Alcohol dependence, uncomplicated: Secondary | ICD-10-CM

## 2015-04-08 LAB — CBC
HCT: 42.8 % (ref 39.0–52.0)
Hemoglobin: 14.7 g/dL (ref 13.0–17.0)
MCH: 32 pg (ref 26.0–34.0)
MCHC: 34.3 g/dL (ref 30.0–36.0)
MCV: 93 fL (ref 78.0–100.0)
Platelets: 306 10*3/uL (ref 150–400)
RBC: 4.6 MIL/uL (ref 4.22–5.81)
RDW: 14.4 % (ref 11.5–15.5)
WBC: 7.8 10*3/uL (ref 4.0–10.5)

## 2015-04-08 LAB — COMPREHENSIVE METABOLIC PANEL
ALT: 30 U/L (ref 17–63)
AST: 25 U/L (ref 15–41)
Albumin: 4.4 g/dL (ref 3.5–5.0)
Alkaline Phosphatase: 70 U/L (ref 38–126)
Anion gap: 14 (ref 5–15)
BUN: 10 mg/dL (ref 6–20)
CO2: 23 mmol/L (ref 22–32)
Calcium: 9.3 mg/dL (ref 8.9–10.3)
Chloride: 101 mmol/L (ref 101–111)
Creatinine, Ser: 0.76 mg/dL (ref 0.61–1.24)
GFR calc Af Amer: >60 mL/min (ref >60)
GFR calc non Af Amer: >60 mL/min (ref >60)
Glucose, Bld: 79 mg/dL (ref 65–99)
Potassium: 3.7 mmol/L (ref 3.5–5.1)
Sodium: 138 mmol/L (ref 135–145)
Total Bilirubin: 0.5 mg/dL (ref 0.3–1.2)
Total Protein: 7.2 g/dL (ref 6.5–8.1)

## 2015-04-08 LAB — RAPID URINE DRUG SCREEN, HOSP PERFORMED
Amphetamines: NOT DETECTED
Barbiturates: NOT DETECTED
Benzodiazepines: NOT DETECTED
Cocaine: NOT DETECTED
Opiates: NOT DETECTED
Tetrahydrocannabinol: POSITIVE — AB

## 2015-04-08 LAB — ETHANOL: Alcohol, Ethyl (B): 104 mg/dL — ABNORMAL HIGH (ref <5)

## 2015-04-08 LAB — ACETAMINOPHEN LEVEL: Acetaminophen (Tylenol), Serum: <10 ug/mL — ABNORMAL LOW (ref 10–30)

## 2015-04-08 LAB — SALICYLATE LEVEL: Salicylate Lvl: <4 mg/dL (ref 2.8–30.0)

## 2015-04-08 MED ORDER — GABAPENTIN 400 MG PO CAPS
400.0000 mg | ORAL_CAPSULE | Freq: Four times a day (QID) | ORAL | Status: DC
  Administered 2015-04-08 – 2015-04-09 (×5): 400 mg via ORAL
  Filled 2015-04-08 (×5): qty 1

## 2015-04-08 MED ORDER — ONDANSETRON HCL 4 MG PO TABS: 4.0000 mg | ORAL_TABLET | Freq: Three times a day (TID) | ORAL | Status: DC | PRN

## 2015-04-08 MED ORDER — LORAZEPAM 1 MG PO TABS: 0.0000 mg | ORAL_TABLET | Freq: Two times a day (BID) | ORAL | Status: DC

## 2015-04-08 MED ORDER — LORAZEPAM 1 MG PO TABS
0.0000 mg | ORAL_TABLET | Freq: Four times a day (QID) | ORAL | Status: DC
  Administered 2015-04-08 (×3): 1 mg via ORAL
  Filled 2015-04-08 (×3): qty 1

## 2015-04-08 MED ORDER — ALLOPURINOL 100 MG PO TABS
100.0000 mg | ORAL_TABLET | Freq: Every day | ORAL | Status: DC
  Administered 2015-04-08 – 2015-04-09 (×2): 100 mg via ORAL
  Filled 2015-04-08 (×2): qty 1

## 2015-04-08 MED ORDER — ACAMPROSATE CALCIUM 333 MG PO TBEC
666.0000 mg | DELAYED_RELEASE_TABLET | Freq: Three times a day (TID) | ORAL | Status: DC
  Administered 2015-04-08 – 2015-04-09 (×4): 666 mg via ORAL
  Filled 2015-04-08 (×5): qty 2

## 2015-04-08 MED ORDER — VITAMIN B-1 100 MG PO TABS
100.0000 mg | ORAL_TABLET | Freq: Every day | ORAL | Status: DC
  Administered 2015-04-08 – 2015-04-09 (×2): 100 mg via ORAL
  Filled 2015-04-08 (×2): qty 1

## 2015-04-08 MED ORDER — ZIPRASIDONE HCL 20 MG PO CAPS
60.0000 mg | ORAL_CAPSULE | Freq: Two times a day (BID) | ORAL | Status: DC
  Administered 2015-04-08 – 2015-04-09 (×3): 60 mg via ORAL
  Filled 2015-04-08 (×3): qty 3

## 2015-04-08 MED ORDER — ALUM & MAG HYDROXIDE-SIMETH 200-200-20 MG/5ML PO SUSP: 30.0000 mL | ORAL | Status: DC | PRN

## 2015-04-08 MED ORDER — SERTRALINE HCL 50 MG PO TABS
75.0000 mg | ORAL_TABLET | Freq: Every day | ORAL | Status: DC
  Administered 2015-04-08 – 2015-04-09 (×2): 75 mg via ORAL
  Filled 2015-04-08 (×2): qty 2

## 2015-04-08 MED ORDER — THIAMINE HCL 100 MG/ML IJ SOLN: 100.0000 mg | Freq: Every day | INTRAMUSCULAR | Status: DC

## 2015-04-08 MED ORDER — FAMOTIDINE 20 MG PO TABS: 20.0000 mg | ORAL_TABLET | Freq: Two times a day (BID) | ORAL | Status: DC | PRN

## 2015-04-08 NOTE — ED Notes (Signed)
Pt AAO x 3, no distress noted, calm & cooperative, remains SI, monitoring for safety, Q 15 min checks in effect. 

## 2015-04-08 NOTE — ED Notes (Signed)
Staffing notified regarding need for sitter.

## 2015-04-08 NOTE — ED Notes (Addendum)
Bed: [id number] Expected date:  Expected time:  Means of arrival:  Comments: 

## 2015-04-08 NOTE — ED Notes (Signed)
Pt reports SI with plan to stab self in neck. Was seen here last month for same feelings, agreed to IOP tx once he sobered up and said he was no longer suicidal. Pt reports he did not f/u with this. Reports he has been taking his meds as Rx'd for schizoaffective d/o. Last took them yesterday, not today. Normally drinks a case of beer a day (24), last drink last night about 12 hours PTA. sts he is feeling very shaky and nauseous. Hx of withdrawal seizures, that often start around the 12 hour sober mark. Denies HI/AVH. Pt calm, cooperative, pleasant in triage.

## 2015-04-08 NOTE — BH Assessment (Signed)
Consulted with Charmaine Downs, NP who recommends inpatient treatment at this time.  TTS to seek placement.   Rosalin Hawking, LCSW Therapeutic Triage Specialist [place] 04/08/2015 5:45 PM
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
## 2015-04-08 NOTE — ED Provider Notes (Signed)
CSN: [id number]     Arrival date & time 04/08/15  0703 History   First MD Initiated Contact with Patient 04/08/15 [phone]     Chief Complaint  Patient presents with  . Suicidal  . Medical Clearance  . Withdrawal    HPI The patient presents to the emergency room for evaluation of depression and alcohol abuse. The patient has a history and was seen in the emergency department approximately 1 month ago. He was referred for outpatient treatment but did not follow up with that recommendation. Patient has been drinking about a case of beer per day. He last drank last evening. He is feeling very shaky and nauseous. He has been feeling increasingly depressed again recently. He has been thinking about stabbing himself in the neck. Denies any homicidal ideations. Patient is requesting help with his depression and alcohol troubles. Past Medical History  Diagnosis Date  . Psychosis   . PTSD (post-traumatic stress disorder)   . Seizure disorder     related to etoh seizure  . Alcohol abuse   . Schizoaffective disorder   . Anxiety     at age 32  . Depression     at age 3   Past Surgical History  Procedure Laterality Date  . Foot surgery      right  . Hand surgery     Family History  Problem Relation Age of Onset  . Alcoholism Mother   . CAD Father   . Depression Brother    Social History  Substance Use Topics  . Smoking status: Current Every Day Smoker -- 1.00 packs/day for 24 years    Types: Cigarettes  . Smokeless tobacco: Never Used  . Alcohol Use: No     Comment: "I'm a binge drinker.[name] 4-40 ounces/day"; last alcohol use 2 weeks ago (02/16/15)    Review of Systems  All other systems reviewed and are negative.     Allergies  Wellbutrin  Home Medications   Prior to Admission medications   Medication Sig Start Date End Date Taking? Authorizing Provider  acamprosate (CAMPRAL) 333 MG tablet Take 2 tablets (666 mg total) by mouth 3 (three) times daily with meals. 03/01/15  Yes Benjamine Mola, FNP  allopurinol (ZYLOPRIM) 100 MG tablet Take 1 tablet (100 mg total) by mouth daily. 03/01/15  Yes Benjamine Mola, FNP  famotidine (PEPCID) 20 MG tablet Take 1 tablet (20 mg total) by mouth 2 (two) times daily. Patient taking differently: Take 20 mg by mouth 2 (two) times daily as needed (for acid reflux).  02/09/15  Yes Shuvon B Rankin, NP  gabapentin (NEURONTIN) 400 MG capsule Take 1 capsule (400 mg total) by mouth 4 (four) times daily. 03/01/15  Yes Benjamine Mola, FNP  naproxen (NAPROSYN) 500 MG tablet Take 1 tablet (500 mg total) by mouth 2 (two) times daily with a meal. Patient taking differently: Take 500 mg by mouth 2 (two) times daily as needed (for pain.).  02/16/15  Yes Josalyn Funches, MD  sertraline (ZOLOFT) 25 MG tablet Take 3 tablets (75 mg total) by mouth daily. 03/12/15  Yes Hampton Abbot, MD  ziprasidone (GEODON) 60 MG capsule Take 1 capsule (60 mg total) by mouth 2 (two) times daily with a meal. 03/12/15  Yes Hampton Abbot, MD   BP 134/84 mmHg  Pulse 98  Temp(Src) 98.5 F (36.9 C) (Oral)  Resp 18  SpO2 92% Physical Exam  Constitutional: No distress.  HENT:  Head: Normocephalic and atraumatic.  Right Ear:  External ear normal.  Left Ear: External ear normal.  Eyes: Conjunctivae are normal. Right eye exhibits no discharge. Left eye exhibits no discharge. No scleral icterus.  Neck: Neck supple. No tracheal deviation present.  Cardiovascular: Regular rhythm and intact distal pulses.  Tachycardia present.   Pulmonary/Chest: Effort normal and breath sounds normal. No stridor. No respiratory distress. He has no wheezes. He has no rales.  Abdominal: Soft. Bowel sounds are normal. He exhibits no distension. There is no tenderness. There is no rebound and no guarding.  Musculoskeletal: He exhibits no edema or tenderness.  Neurological: He is alert. He has normal strength. He displays tremor. No cranial nerve deficit (no facial droop, extraocular movements intact, no slurred  speech) or sensory deficit. He exhibits normal muscle tone. He displays no seizure activity. Coordination normal.  Skin: Skin is warm. No rash noted. He is not diaphoretic.  Psychiatric: His affect is blunt. His speech is not delayed and not tangential. He is withdrawn. He is not aggressive. He exhibits a depressed mood. He expresses suicidal ideation.  Nursing note and vitals reviewed.   ED Course  Procedures (including critical care time) Labs Review Labs Reviewed  ETHANOL - Abnormal; Notable for the following:    Alcohol, Ethyl (B) 104 (*)    All other components within normal limits  ACETAMINOPHEN LEVEL - Abnormal; Notable for the following:    Acetaminophen (Tylenol), Serum <10 (*)    All other components within normal limits  URINE RAPID DRUG SCREEN, HOSP PERFORMED - Abnormal; Notable for the following:    Tetrahydrocannabinol POSITIVE (*)    All other components within normal limits  COMPREHENSIVE METABOLIC PANEL  SALICYLATE LEVEL  CBC     MDM   Final diagnoses:  Depression    Pt was assessed by [facility].  Anticipate inpatient treatment.  Pt is medically stable    Dorie Rank, MD 04/08/15 [phone]

## 2015-04-08 NOTE — BH Assessment (Signed)
Seeking inpt placement for MH/SA. Sent referrals to the following facilities for potential placement: [facility], [facility], [facility], [facility], [facility], [place], [place] Triage Specialist 04/08/2015 7:56 PM

## 2015-04-08 NOTE — ED Notes (Signed)
Bed: WA24 Expected date:  Expected time:  Means of arrival:  Comments: 

## 2015-04-09 ENCOUNTER — Inpatient Hospital Stay (HOSPITAL_COMMUNITY): Admission: AD | Admit: 2015-04-09 | Payer: Medicare Other | Source: Intra-hospital | Admitting: Psychiatry

## 2015-04-09 DIAGNOSIS — R45851 Suicidal ideations: Secondary | ICD-10-CM | POA: Diagnosis not present

## 2015-04-09 DIAGNOSIS — F1024 Alcohol dependence with alcohol-induced mood disorder: Secondary | ICD-10-CM

## 2015-04-09 DIAGNOSIS — F333 Major depressive disorder, recurrent, severe with psychotic symptoms: Secondary | ICD-10-CM | POA: Diagnosis present

## 2015-04-09 MED ORDER — NICOTINE 21 MG/24HR TD PT24
21.0000 mg | MEDICATED_PATCH | Freq: Every day | TRANSDERMAL | Status: DC
  Administered 2015-04-09: 21 mg via TRANSDERMAL
  Filled 2015-04-09: qty 1

## 2015-04-09 NOTE — ED Notes (Signed)
Patient denies SI, HI and AVG at this time. Patient calm and cooperative at this time. Plan of care discussed. Encouragement and support provided and safety maintain. Q 15 min safety checks remain in place.

## 2015-04-09 NOTE — BHH Counselor (Signed)
Pending review for possible placement with [facility].  

## 2015-04-09 NOTE — ED Notes (Addendum)
Patient has been in his room sleeping most of the day.  His CIWA scores have been negative.  Late this afternoon I was told that he would be transferred to [facility].  When I told the patient he was going move over to [facility] and he stated that he was under the impression that he was to be discharged home.  I spoke with the TTS counselor and she stated that she thought patient was to be discharged as well.  She is contacting IT consultant for clarification.  I also spoke with Randall Hiss, [facility] and he will get back to [place] on what the plan is.

## 2015-04-09 NOTE — BH Assessment (Signed)
[place] Assessment Progress Note  The following facilities have been contacted to seek placement for this pt, with results as noted:  Beds available, information sent, decision pending:  [place]:  [facility], [place] Triage Specialist [phone]

## 2015-04-09 NOTE — ED Notes (Signed)
AVS and bus pass provided. AVS reviewed and understanding verbalized. Denied SI/HI/AVH. Denied pain. Pt offered no questions or concerns. Escorted off the unit, belongings returned and directed to the exit 

## 2015-04-09 NOTE — Consult Note (Signed)
Patient presented to the ED after drinking alcohol and started having suicidal ideations.  This morning, he stated he was depressed but did not feel suicidal.  Later, this afternoon the patient was going to be transferred to [facility] but did not want to go.  He requested to return home and follow-up with outpatient therapy.  Terius tells this NP if his suicidal thoughts return, he will call his sister or brother or return to the ED for help.  He denied suicidal/homicidal ideations, hallucinations, and drug issues.  Denies withdrawal symptoms.  Stable for discharge.  Waylan Boga, PMH-NP 04/09/2015 Patient seen face-to-face for psychiatric evaluation, chart reviewed and case discussed with the physician extender and developed treatment plan. Reviewed the information documented and agree with the treatment plan. Corena Pilgrim, MD

## 2015-04-09 NOTE — BHH Suicide Risk Assessment (Signed)
Suicide Risk Assessment  Discharge Assessment   [facility] Discharge Suicide Risk Assessment   Demographic Factors:  Male, Caucasian and Living alone  Total Time spent with patient: 45 minutes  Musculoskeletal: Strength & Muscle Tone: within normal limits Gait & Station: normal Patient leans: N/A  Psychiatric Specialty Exam:     Blood pressure 118/83, pulse 75, temperature 98.1 F (36.7 C), temperature source Oral, resp. rate 18, SpO2 97 %.There is no weight on file to calculate BMI.  General Appearance: Casual  Eye Contact::  Good  Speech:  Normal Rate409  Volume:  Normal  Mood:  Depressed  Affect:  Congruent  Thought Process:  Coherent  Orientation:  Full (Time, Place, and Person)  Thought Content:  WDL  Suicidal Thoughts:  No  Homicidal Thoughts:  No  Memory:  Immediate;   Good Recent;   Good Remote;   Good  Judgement:  Good  Insight:  Good  Psychomotor Activity:  Normal  Concentration:  Good  Recall:  Good  Fund of Knowledge:Good  Language: Good  Akathisia:  No  Handed:  Right  AIMS (if indicated):     Assets:  Housing Leisure Time Physical Health Resilience Social Support  Sleep:     Cognition: WNL  ADL's:  Intact      Has this patient used any form of tobacco in the last 30 days? (Cigarettes, Smokeless Tobacco, Cigars, and/or Pipes) No  Mental Status Per Nursing Assessment::   On Admission:   Alcohol intoxication with suicidal ideations  Current Mental Status by Physician: NA  Loss Factors: NA  Historical Factors: NA  Risk Reduction Factors:   Sense of responsibility to family, Positive social support and Positive therapeutic relationship  Continued Clinical Symptoms:  Depression  Cognitive Features That Contribute To Risk:  None    Suicide Risk:  Minimal: No identifiable suicidal ideation.  Patients presenting with no risk factors but with morbid ruminations; may be classified as minimal risk based on the severity of the depressive  symptoms  Principal Problem: MDD (major depressive disorder), recurrent, severe, with psychosis Discharge Diagnoses:  Patient Active Problem List   Diagnosis Date Noted  . Alcohol use disorder, severe, dependence [F10.20] 11/29/2014    Priority: High  . Post traumatic stress disorder (PTSD) [F43.10] 11/03/2011    Priority: High  . MDD (major depressive disorder), recurrent, severe, with psychosis [F33.3] 04/09/2015  . Suicidal ideation [R45.851] 04/09/2015  . Suicide ideation [R45.851]   . Major depressive disorder, recurrent episode, mild [F33.0]   . Alcohol dependence with alcohol-induced mood disorder [F10.24]   . Current smoker [Z72.0] 11/20/2014  . Tremors [G25.2] 11/03/2014  . Noncompliance with medication regimen [Z91.14]       Plan Of Care/Follow-up recommendations:  Activity:  as tolerated Diet:  heart healthy diet  Is patient on multiple antipsychotic therapies at discharge:  No   Has Patient had three or more failed trials of antipsychotic monotherapy by history:  No  Recommended Plan for Multiple Antipsychotic Therapies: NA    LORD, JAMISON, PMH-NP 04/09/2015, 7:10 PM

## 2015-04-09 NOTE — Consult Note (Signed)
[facility] Face-to-Face Psychiatry Consult   Reason for Consult: Suicidal thoughts, requesting for alcohol detox Referring Physician: EPS Patient Identification: [name] MRN:  [id number] Principal Diagnosis: MDD (major depressive disorder), recurrent, severe, with psychosis Diagnosis:   Patient Active Problem List   Diagnosis Date Noted  . MDD (major depressive disorder), recurrent, severe, with psychosis [F33.3] 04/09/2015    Priority: High  . Suicidal ideation [R45.851] 04/09/2015    Priority: High  . Alcohol dependence with alcohol-induced mood disorder [F10.24]     Priority: High  . Suicide ideation [R45.851]   . Major depressive disorder, recurrent episode, mild [F33.0]   . Alcohol use disorder, severe, dependence [F10.20] 11/29/2014  . Current smoker [Z72.0] 11/20/2014  . Tremors [G25.2] 11/03/2014  . Noncompliance with medication regimen [Z91.14]   . Post traumatic stress disorder (PTSD) [F43.10] 11/03/2011    Total Time spent with patient: 45 minutes  Subjective:   [name] is a 44 y.o. male patient admitted with depression and alcohol withdrawal. Pt seen and evaluated by Dr. Darleene Cleaver and Waylan Boga, DNP. Pt continues to report suicidal ideation with a plan to stab himself. Pt also continues to report auditory hallucinations. Denies homicidal ideation and visual hallucinations. Continue seeking placement at this time.   HPI: I have reviewed and concur with ED HPI, modified as below: [name] is an 44 y.o. male who presents to [facility] voluntarily with [facility] who states that he is currently suicidal with a plan to stab himself in the neck with a knife. Patient states that he does have access to a knife. Patient states that he has been depressed ongoing for a few years. Patient states that he was discharged from [facility] with a plan to follow up with [facility] but did not. Patient states that he has been going to [facility] for about four years for outpatient and his last  appointment was earlier this month Patient states that he hears voices that tell him that he's "a piece of shit." Patient denies that the voices tell him to hurt other people and are not command in nature. Patient denies HI and psychosis.  Patient was calm and cooperative with assessment. Patient was alert and orietned x4. Patient made poor eye contact. Patients affect was flat and he appears depressed. Patients mood and affect are congruent. Patient stared forward during the assessment with little no to eye contact. Patient states that he has a history of suicide attempts noting at least three in the past with the last time being "a few years ago." Patient states the he sometimes burns himself with cigarettes. Patients BAL was 104 and UDS was positive for THC. Patient states that he drinks a 24 case of beer daily. Patient states that he has been drinking since he was a teenager. Patient states that he smokes THC "occassionally" and states that he last smoked a few weeks ago and has smoked since a teenager. Patient states that he is seeking inpatient treatment for detox and schizoaffective disorder.    HPI Elements:   Location:  depressed, alcohol use. Quality:  severe. Duration:  ongoing for months. Context:  non-complant with medications and alcohol use disorder.  Past Medical History:  Past Medical History  Diagnosis Date  . Psychosis   . PTSD (post-traumatic stress disorder)   . Seizure disorder     related to etoh seizure  . Alcohol abuse   . Schizoaffective disorder   . Anxiety     at age 52  . Depression  at age 51    Past Surgical History  Procedure Laterality Date  . Foot surgery      right  . Hand surgery     Family History:  Family History  Problem Relation Age of Onset  . Alcoholism Mother   . CAD Father   . Depression Brother    Social History:  History  Alcohol Use No    Comment: "I'm a binge drinker.[name] 4-40 ounces/day"; last alcohol use 2 weeks ago (02/16/15)      History  Drug Use  . 1.00 per week  . Special: Marijuana    Comment: THC 2 to 3 times per month; last marijuana use one month ago (02/16/15)    Social History   Social History  . Marital Status: Divorced    Spouse Name: N/A  . Number of Children: N/A  . Years of Education: N/A   Social History Main Topics  . Smoking status: Current Every Day Smoker -- 1.00 packs/day for 24 years    Types: Cigarettes  . Smokeless tobacco: Never Used  . Alcohol Use: No     Comment: "I'm a binge drinker.[name] 4-40 ounces/day"; last alcohol use 2 weeks ago (02/16/15)  . Drug Use: 1.00 per week    Special: Marijuana     Comment: THC 2 to 3 times per month; last marijuana use one month ago (02/16/15)  . Sexual Activity: Yes   Other Topics Concern  . None   Social History Narrative   Additional Social History:    Pain Medications: See PTA Prescriptions: See PTA Over the Counter: See PTA History of alcohol / drug use?: Yes Longest period of sobriety (when/how long): 3.5 years Negative Consequences of Use: Legal, Personal relationships Withdrawal Symptoms: Nausea / Vomiting, Tremors Name of Substance 1: Alcohol 1 - Age of First Use: 14 1 - Amount (size/oz): Case 24 1 - Frequency: Daily 1 - Duration: ongoing 1 - Last Use / Amount: 04/07/2015 Name of Substance 2: THC 2 - Age of First Use: 15 2 - Amount (size/oz): "a couple joints" 2 - Frequency: Ocassionally 2 - Duration: ongoing 2 - Last Use / Amount: 03/19/2015                 Allergies:   Allergies  Allergen Reactions  . Wellbutrin [Bupropion] Other (See Comments)    Makes me feel like I'm have a seizure    Labs:  Results for orders placed or performed during the hospital encounter of 04/08/15 (from the past 48 hour(s))  Urine rapid drug screen (hosp performed) (Not at [facility])     Status: Abnormal   Collection Time: 04/08/15  7:31 AM  Result Value Ref Range   Opiates NONE DETECTED NONE DETECTED   Cocaine NONE DETECTED NONE  DETECTED   Benzodiazepines NONE DETECTED NONE DETECTED   Amphetamines NONE DETECTED NONE DETECTED   Tetrahydrocannabinol POSITIVE (A) NONE DETECTED   Barbiturates NONE DETECTED NONE DETECTED    Comment:        DRUG SCREEN FOR MEDICAL PURPOSES ONLY.  IF CONFIRMATION IS NEEDED FOR ANY PURPOSE, NOTIFY LAB WITHIN 5 DAYS.        LOWEST DETECTABLE LIMITS FOR URINE DRUG SCREEN Drug Class       Cutoff (ng/mL) Amphetamine      1000 Barbiturate      200 Benzodiazepine   697 Tricyclics       948 Opiates          300 Cocaine  300 THC              50   Comprehensive metabolic panel     Status: None   Collection Time: 04/08/15  7:50 AM  Result Value Ref Range   Sodium 138 135 - 145 mmol/L   Potassium 3.7 3.5 - 5.1 mmol/L   Chloride 101 101 - 111 mmol/L   CO2 23 22 - 32 mmol/L   Glucose, Bld 79 65 - 99 mg/dL   BUN 10 6 - 20 mg/dL   Creatinine, Ser 0.76 0.61 - 1.24 mg/dL   Calcium 9.3 8.9 - 10.3 mg/dL   Total Protein 7.2 6.5 - 8.1 g/dL   Albumin 4.4 3.5 - 5.0 g/dL   AST 25 15 - 41 U/L   ALT 30 17 - 63 U/L   Alkaline Phosphatase 70 38 - 126 U/L   Total Bilirubin 0.5 0.3 - 1.2 mg/dL   GFR calc non Af Amer >60 >60 mL/min   GFR calc Af Amer >60 >60 mL/min    Comment: (NOTE) The eGFR has been calculated using the CKD EPI equation. This calculation has not been validated in all clinical situations. eGFR's persistently <60 mL/min signify possible Chronic Kidney Disease.    Anion gap 14 5 - 15  Ethanol (ETOH)     Status: Abnormal   Collection Time: 04/08/15  7:50 AM  Result Value Ref Range   Alcohol, Ethyl (B) 104 (H) <5 mg/dL    Comment:        LOWEST DETECTABLE LIMIT FOR SERUM ALCOHOL IS 5 mg/dL FOR MEDICAL PURPOSES ONLY   Salicylate level     Status: None   Collection Time: 04/08/15  7:50 AM  Result Value Ref Range   Salicylate Lvl <8.0 2.8 - 30.0 mg/dL  Acetaminophen level     Status: Abnormal   Collection Time: 04/08/15  7:50 AM  Result Value Ref Range    Acetaminophen (Tylenol), Serum <10 (L) 10 - 30 ug/mL    Comment:        THERAPEUTIC CONCENTRATIONS VARY SIGNIFICANTLY. A RANGE OF 10-30 ug/mL MAY BE AN EFFECTIVE CONCENTRATION FOR MANY PATIENTS. HOWEVER, SOME ARE BEST TREATED AT CONCENTRATIONS OUTSIDE THIS RANGE. ACETAMINOPHEN CONCENTRATIONS >150 ug/mL AT 4 HOURS AFTER INGESTION AND >50 ug/mL AT 12 HOURS AFTER INGESTION ARE OFTEN ASSOCIATED WITH TOXIC REACTIONS.   CBC     Status: None   Collection Time: 04/08/15  7:50 AM  Result Value Ref Range   WBC 7.8 4.0 - 10.5 K/uL   RBC 4.60 4.22 - 5.81 MIL/uL   Hemoglobin 14.7 13.0 - 17.0 g/dL   HCT 42.8 39.0 - 52.0 %   MCV 93.0 78.0 - 100.0 fL   MCH 32.0 26.0 - 34.0 pg   MCHC 34.3 30.0 - 36.0 g/dL   RDW 14.4 11.5 - 15.5 %   Platelets 306 150 - 400 K/uL    Vitals: Blood pressure 102/63, pulse 65, temperature 97.5 F (36.4 C), temperature source Oral, resp. rate 18, SpO2 99 %.  Risk to Self: Suicidal Ideation: Yes-Currently Present Suicidal Intent: Yes-Currently Present Is patient at risk for suicide?: Yes Suicidal Plan?: Yes-Currently Present Specify Current Suicidal Plan: "stab myself in the neck" Access to Means: Yes Specify Access to Suicidal Means: Knives What has been your use of drugs/alcohol within the last 12 months?: Case of beer daily How many times?: 3 Other Self Harm Risks: Denies Triggers for Past Attempts: Hallucinations, Unpredictable Intentional Self Injurious Behavior: Burning Comment - Self Injurious  Behavior: Pt states that he burns himself with cigarettes Risk to Others: Homicidal Ideation: No Thoughts of Harm to Others: No Current Homicidal Intent: No Current Homicidal Plan: No Access to Homicidal Means: No Identified Victim: Denies History of harm to others?: No Assessment of Violence: None Noted Violent Behavior Description: None Does patient have access to weapons?: No Criminal Charges Pending?: No Does patient have a court date: No Prior  Inpatient Therapy: Prior Inpatient Therapy: Yes Prior Therapy Dates: Multiple Prior Therapy Facilty/Provider(s): [facility] Reason for Treatment: SA and Schizoaffective disorder Prior Outpatient Therapy: Prior Outpatient Therapy: Yes Prior Therapy Dates: 2012-Present Prior Therapy Facilty/Provider(s): [facility] Reason for Treatment: SA/Schizoaffective Disorder Does patient have an ACCT team?: No Does patient have Intensive In-House Services?  : No Does patient have [facility] services? : Yes Does patient have [facility] services?: No  Current Facility-Administered Medications  Medication Dose Route Frequency Provider Last Rate Last Dose  . acamprosate (CAMPRAL) tablet 666 mg  666 mg Oral TID WC Dorie Rank, MD   666 mg at 04/09/15 0855  . allopurinol (ZYLOPRIM) tablet 100 mg  100 mg Oral Daily Dorie Rank, MD   100 mg at 04/09/15 1025  . alum & mag hydroxide-simeth (MAALOX/MYLANTA) 200-200-20 MG/5ML suspension 30 mL  30 mL Oral PRN Dorie Rank, MD      . famotidine (PEPCID) tablet 20 mg  20 mg Oral BID PRN Dorie Rank, MD      . gabapentin (NEURONTIN) capsule 400 mg  400 mg Oral QID Dorie Rank, MD   400 mg at 04/09/15 1025  . LORazepam (ATIVAN) tablet 0-4 mg  0-4 mg Oral 4 times per day Dorie Rank, MD   1 mg at 04/08/15 1728   Followed by  . [START ON 04/10/2015] LORazepam (ATIVAN) tablet 0-4 mg  0-4 mg Oral Q12H Dorie Rank, MD      . nicotine (NICODERM CQ - dosed in mg/24 hours) patch 21 mg  21 mg Transdermal Daily Patrecia Pour, NP   21 mg at 04/09/15 1028  . ondansetron (ZOFRAN) tablet 4 mg  4 mg Oral Q8H PRN Dorie Rank, MD      . sertraline (ZOLOFT) tablet 75 mg  75 mg Oral Daily Dorie Rank, MD   75 mg at 04/09/15 1025  . thiamine (VITAMIN B-1) tablet 100 mg  100 mg Oral Daily Dorie Rank, MD   100 mg at 04/09/15 1025   Or  . thiamine (B-1) injection 100 mg  100 mg Intravenous Daily Dorie Rank, MD      . ziprasidone (GEODON) capsule 60 mg  60 mg Oral BID WC Dorie Rank, MD   60 mg at 04/09/15 4270   Current  Outpatient Prescriptions  Medication Sig Dispense Refill  . acamprosate (CAMPRAL) 333 MG tablet Take 2 tablets (666 mg total) by mouth 3 (three) times daily with meals. 180 tablet 0  . allopurinol (ZYLOPRIM) 100 MG tablet Take 1 tablet (100 mg total) by mouth daily. 30 tablet 3  . famotidine (PEPCID) 20 MG tablet Take 1 tablet (20 mg total) by mouth 2 (two) times daily. (Patient taking differently: Take 20 mg by mouth 2 (two) times daily as needed (for acid reflux). ) 30 tablet 0  . gabapentin (NEURONTIN) 400 MG capsule Take 1 capsule (400 mg total) by mouth 4 (four) times daily. 120 capsule 0  . naproxen (NAPROSYN) 500 MG tablet Take 1 tablet (500 mg total) by mouth 2 (two) times daily with a meal. (Patient taking differently: Take 500  mg by mouth 2 (two) times daily as needed (for pain.). ) 60 tablet 2  . sertraline (ZOLOFT) 25 MG tablet Take 3 tablets (75 mg total) by mouth daily. 90 tablet 0  . ziprasidone (GEODON) 60 MG capsule Take 1 capsule (60 mg total) by mouth 2 (two) times daily with a meal. 60 capsule 0    Musculoskeletal: Strength & Muscle Tone: within normal limits Gait & Station: normal Patient leans: N/A  Psychiatric Specialty Exam: Physical Exam  Psychiatric: His mood appears anxious. His affect is blunt. His speech is delayed. He is slowed and withdrawn. Cognition and memory are normal. He expresses impulsivity. He exhibits a depressed mood. He expresses suicidal ideation.    Review of Systems  HENT: Negative.   Eyes: Negative.   Respiratory: Negative.   Cardiovascular: Negative.   Genitourinary: Negative.   Musculoskeletal: Positive for myalgias.  Skin: Negative.   Endo/Heme/Allergies: Negative.   Psychiatric/Behavioral: Positive for depression, suicidal ideas and substance abuse. The patient is nervous/anxious and has insomnia.   All other systems reviewed and are negative.   Blood pressure 102/63, pulse 65, temperature 97.5 F (36.4 C), temperature source Oral,  resp. rate 18, SpO2 99 %.There is no weight on file to calculate BMI.  General Appearance: Casual and Disheveled  Eye Contact::  Fair  Speech:  Clear and Coherent and Normal Rate  Volume:  Decreased  Mood:  Anxious and Depressed  Affect:  Constricted  Thought Process:  Disorganized  Orientation:  Full (Time, Place, and Person)  Thought Content:  Hallucinations: Auditory  Suicidal Thoughts:  Yes.  with intent/plan  Homicidal Thoughts:  No  Memory:  Immediate;   Fair Recent;   Good Remote;   Good  Judgement:  Impaired  Insight:  Lacking  Psychomotor Activity:  Decreased  Concentration:  Poor  Recall:  [employer] of Knowledge:Fair  Language: Fair  Akathisia:  No  Handed:  Right  AIMS (if indicated):     Assets:  Communication Skills Desire for Improvement Social Support  ADL's:  Intact  Cognition: WNL  Sleep:   poor   Medical Decision Making: Review or order clinical lab tests (1), Established Problem, Worsening (2), Review of Medication Regimen & Side Effects (2) and Review of New Medication or Change in Dosage (2)  Treatment Plan Summary: MDD (major depressive disorder), recurrent, severe, with psychosis, unstable, treated as below: Daily contact with patient to assess and evaluate symptoms and progress in treatment and Medication management  Medications: -CIWA protocol with Ativan protocl -Restart home meds  Disposition: -Inpatient psychiatric hospitalization for safety and stabilization.  Benjamine Mola, FNP-BC 04/09/2015 12:47 PM Patient seen face-to-face for psychiatric evaluation, chart reviewed and case discussed with the physician extender and developed treatment plan. Reviewed the information documented and agree with the treatment plan. Corena Pilgrim, MD

## 2015-04-23 ENCOUNTER — Encounter (HOSPITAL_COMMUNITY): Payer: Self-pay | Admitting: *Deleted

## 2015-04-23 ENCOUNTER — Emergency Department (HOSPITAL_COMMUNITY): Payer: Medicare Other

## 2015-04-23 ENCOUNTER — Emergency Department (HOSPITAL_COMMUNITY)
Admission: EM | Admit: 2015-04-23 | Discharge: 2015-04-25 | Disposition: A | Discharge status: Home / Self Care | Payer: Medicare Other | Attending: Emergency Medicine | Admitting: Emergency Medicine

## 2015-04-23 DIAGNOSIS — R251 Tremor, unspecified: Secondary | ICD-10-CM | POA: Diagnosis not present

## 2015-04-23 DIAGNOSIS — G40909 Epilepsy, unspecified, not intractable, without status epilepticus: Secondary | ICD-10-CM | POA: Insufficient documentation

## 2015-04-23 DIAGNOSIS — F10120 Alcohol abuse with intoxication, uncomplicated: Secondary | ICD-10-CM | POA: Insufficient documentation

## 2015-04-23 DIAGNOSIS — Z720 Tobacco use: Secondary | ICD-10-CM | POA: Diagnosis not present

## 2015-04-23 DIAGNOSIS — R45851 Suicidal ideations: Secondary | ICD-10-CM

## 2015-04-23 DIAGNOSIS — R200 Anesthesia of skin: Secondary | ICD-10-CM | POA: Insufficient documentation

## 2015-04-23 DIAGNOSIS — F259 Schizoaffective disorder, unspecified: Secondary | ICD-10-CM | POA: Diagnosis not present

## 2015-04-23 DIAGNOSIS — F29 Unspecified psychosis not due to a substance or known physiological condition: Secondary | ICD-10-CM | POA: Insufficient documentation

## 2015-04-23 DIAGNOSIS — F4310 Post-traumatic stress disorder, unspecified: Secondary | ICD-10-CM | POA: Diagnosis not present

## 2015-04-23 DIAGNOSIS — R51 Headache: Secondary | ICD-10-CM | POA: Insufficient documentation

## 2015-04-23 DIAGNOSIS — Z79899 Other long term (current) drug therapy: Secondary | ICD-10-CM | POA: Diagnosis not present

## 2015-04-23 DIAGNOSIS — F329 Major depressive disorder, single episode, unspecified: Secondary | ICD-10-CM | POA: Insufficient documentation

## 2015-04-23 DIAGNOSIS — IMO0002 Reserved for concepts with insufficient information to code with codable children: Secondary | ICD-10-CM

## 2015-04-23 DIAGNOSIS — F1210 Cannabis abuse, uncomplicated: Secondary | ICD-10-CM | POA: Diagnosis not present

## 2015-04-23 DIAGNOSIS — F330 Major depressive disorder, recurrent, mild: Secondary | ICD-10-CM | POA: Diagnosis present

## 2015-04-23 DIAGNOSIS — R000 Tachycardia, unspecified: Secondary | ICD-10-CM | POA: Insufficient documentation

## 2015-04-23 DIAGNOSIS — F419 Anxiety disorder, unspecified: Secondary | ICD-10-CM | POA: Insufficient documentation

## 2015-04-23 DIAGNOSIS — F1020 Alcohol dependence, uncomplicated: Secondary | ICD-10-CM | POA: Diagnosis not present

## 2015-04-23 LAB — I-STAT CHEM 8, ED
BUN: 9 mg/dL (ref 6–20)
Calcium, Ion: 0.96 mmol/L — ABNORMAL LOW (ref 1.12–1.23)
Chloride: 92 mmol/L — ABNORMAL LOW (ref 101–111)
Creatinine, Ser: 1.1 mg/dL (ref 0.61–1.24)
Glucose, Bld: 86 mg/dL (ref 65–99)
HCT: 47 % (ref 39.0–52.0)
Hemoglobin: 16 g/dL (ref 13.0–17.0)
Potassium: 3.5 mmol/L (ref 3.5–5.1)
Sodium: 133 mmol/L — ABNORMAL LOW (ref 135–145)
TCO2: 23 mmol/L (ref 0–100)

## 2015-04-23 LAB — COMPREHENSIVE METABOLIC PANEL
ALT: 57 U/L (ref 17–63)
AST: 79 U/L — ABNORMAL HIGH (ref 15–41)
Albumin: 5.2 g/dL — ABNORMAL HIGH (ref 3.5–5.0)
Alkaline Phosphatase: 86 U/L (ref 38–126)
Anion gap: 26 — ABNORMAL HIGH (ref 5–15)
BUN: 12 mg/dL (ref 6–20)
CO2: 21 mmol/L — ABNORMAL LOW (ref 22–32)
Calcium: 9.3 mg/dL (ref 8.9–10.3)
Chloride: 85 mmol/L — ABNORMAL LOW (ref 101–111)
Creatinine, Ser: 0.82 mg/dL (ref 0.61–1.24)
GFR calc Af Amer: >60 mL/min (ref >60)
GFR calc non Af Amer: >60 mL/min (ref >60)
Glucose, Bld: 111 mg/dL — ABNORMAL HIGH (ref 65–99)
Potassium: 4.6 mmol/L (ref 3.5–5.1)
Sodium: 132 mmol/L — ABNORMAL LOW (ref 135–145)
Total Bilirubin: 1.8 mg/dL — ABNORMAL HIGH (ref 0.3–1.2)
Total Protein: 8.8 g/dL — ABNORMAL HIGH (ref 6.5–8.1)

## 2015-04-23 LAB — CBC
HCT: 46.3 % (ref 39.0–52.0)
Hemoglobin: 16.7 g/dL (ref 13.0–17.0)
MCH: 32.9 pg (ref 26.0–34.0)
MCHC: 36.1 g/dL — ABNORMAL HIGH (ref 30.0–36.0)
MCV: 91.1 fL (ref 78.0–100.0)
Platelets: 314 10*3/uL (ref 150–400)
RBC: 5.08 MIL/uL (ref 4.22–5.81)
RDW: 13.8 % (ref 11.5–15.5)
WBC: 11 10*3/uL — ABNORMAL HIGH (ref 4.0–10.5)

## 2015-04-23 LAB — RAPID URINE DRUG SCREEN, HOSP PERFORMED
Amphetamines: NOT DETECTED
Barbiturates: NOT DETECTED
Benzodiazepines: NOT DETECTED
Cocaine: NOT DETECTED
Opiates: NOT DETECTED
Tetrahydrocannabinol: POSITIVE — AB

## 2015-04-23 LAB — ETHANOL: Alcohol, Ethyl (B): 301 mg/dL (ref <5)

## 2015-04-23 LAB — ACETAMINOPHEN LEVEL: Acetaminophen (Tylenol), Serum: <10 ug/mL — ABNORMAL LOW (ref 10–30)

## 2015-04-23 LAB — SALICYLATE LEVEL: Salicylate Lvl: <4 mg/dL (ref 2.8–30.0)

## 2015-04-23 MED ORDER — ACAMPROSATE CALCIUM 333 MG PO TBEC
666.0000 mg | DELAYED_RELEASE_TABLET | Freq: Three times a day (TID) | ORAL | Status: DC
  Administered 2015-04-24 – 2015-04-25 (×5): 666 mg via ORAL
  Filled 2015-04-23 (×7): qty 2

## 2015-04-23 MED ORDER — SODIUM CHLORIDE 0.9 % IV BOLUS (SEPSIS)
1000.0000 mL | Freq: Once | INTRAVENOUS | Status: AC
  Administered 2015-04-23: 1000 mL via INTRAVENOUS

## 2015-04-23 MED ORDER — SODIUM CHLORIDE 0.9 % IV SOLN
Freq: Once | INTRAVENOUS | Status: AC
  Administered 2015-04-23: 16:00:00 via INTRAVENOUS

## 2015-04-23 MED ORDER — SERTRALINE HCL 50 MG PO TABS
75.0000 mg | ORAL_TABLET | Freq: Every day | ORAL | Status: DC
  Administered 2015-04-23 – 2015-04-24 (×2): 75 mg via ORAL
  Administered 2015-04-25: 10:00:00 via ORAL
  Filled 2015-04-23 (×3): qty 2

## 2015-04-23 MED ORDER — LORAZEPAM 1 MG PO TABS
0.0000 mg | ORAL_TABLET | Freq: Four times a day (QID) | ORAL | Status: DC
  Administered 2015-04-23: 2 mg via ORAL
  Administered 2015-04-24 – 2015-04-25 (×4): 1 mg via ORAL
  Filled 2015-04-23 (×3): qty 1
  Filled 2015-04-23: qty 2
  Filled 2015-04-23: qty 1

## 2015-04-23 MED ORDER — FAMOTIDINE 20 MG PO TABS
20.0000 mg | ORAL_TABLET | Freq: Two times a day (BID) | ORAL | Status: DC
  Administered 2015-04-23 – 2015-04-25 (×4): 20 mg via ORAL
  Filled 2015-04-23 (×4): qty 1

## 2015-04-23 MED ORDER — ZIPRASIDONE HCL 20 MG PO CAPS
60.0000 mg | ORAL_CAPSULE | Freq: Two times a day (BID) | ORAL | Status: DC
  Administered 2015-04-24 – 2015-04-25 (×3): 60 mg via ORAL
  Filled 2015-04-23 (×3): qty 3

## 2015-04-23 MED ORDER — LORAZEPAM 1 MG PO TABS
1.0000 mg | ORAL_TABLET | Freq: Once | ORAL | Status: AC
  Administered 2015-04-23: 1 mg via ORAL
  Filled 2015-04-23: qty 1

## 2015-04-23 MED ORDER — GABAPENTIN 400 MG PO CAPS
400.0000 mg | ORAL_CAPSULE | Freq: Four times a day (QID) | ORAL | Status: DC
  Administered 2015-04-23 – 2015-04-25 (×7): 400 mg via ORAL
  Filled 2015-04-23 (×7): qty 1

## 2015-04-23 MED ORDER — LORAZEPAM 1 MG PO TABS: 0.0000 mg | ORAL_TABLET | Freq: Two times a day (BID) | ORAL | Status: DC

## 2015-04-23 MED ORDER — ALLOPURINOL 100 MG PO TABS
100.0000 mg | ORAL_TABLET | Freq: Every day | ORAL | Status: DC
  Administered 2015-04-24 – 2015-04-25 (×2): 100 mg via ORAL
  Filled 2015-04-23 (×3): qty 1

## 2015-04-23 NOTE — ED Notes (Signed)
Pt c/o feeling different in right face.  No facial droop.  No change in speech.  Neuro WNL.  Notified MD of verbalization by patient.  Pt asking again for pain meds for a headache.  IVF hung per verbal order.
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
## 2015-04-23 NOTE — ED Notes (Signed)
Pt presents with passive SI, pt states I don't care if I live or die. AAO x 3, no distress noted,  Complaint of depression and hearing voices berating him he states.  Pt admits to history of Schizoaffective DO.  Pt states he drinks a case of beer per day with occasional marijuana use.  Feeling hopeless, reports he attempted SI 4 years ago by taking pills.  Monitoring for safety, Q 15 min checks in effect.

## 2015-04-23 NOTE — ED Notes (Addendum)
Pt called [facility] and brought in for SI and ETOH withdrawal. SI plan to stab himself in the neck with a knife. Pt contracts for safety. Denies HI. Reports schizoaffective disorder. AH, VH-sees shadow people. Pt had seizure from ETOH withdrawal on 7/24 per [facility]. Pt reports he feels like he is going to have a seizure. Last ETOH last night. Reports abd pain and headache 8/10. Reports he has not eaten in several days.

## 2015-04-23 NOTE — ED Provider Notes (Addendum)
CSN: [id number]     Arrival date & time 04/23/15  1147 History   None    Chief Complaint  Patient presents with  . Suicidal   . ETOH Detox      (Consider location/radiation/quality/duration/timing/severity/associated sxs/prior Treatment) HPI Comments: Patient states that he is here for alcohol withdrawal symptoms.  He says he's having a tremor and right facial numbness that all started this morning.  He also is complaining of a slight headache.  His last drink was just prior to arrival in the emergency department.  He states he drinks a case of beer daily.  Denies any hard alcohol.  He states he has a history of alcohol withdrawal seizures remember the last time.  He also has schizoaffective disorder and followed at [facility].  He was seen there approximately a month ago with no change in his medicines.  He denies any lapse in taking his medications.  He states he took his medication this morning, but he does endorse suicidality.  His plan is to step himself in the neck  The history is provided by the patient.    Past Medical History  Diagnosis Date  . Psychosis   . PTSD (post-traumatic stress disorder)   . Seizure disorder     related to etoh seizure  . Alcohol abuse   . Schizoaffective disorder   . Anxiety     at age 53  . Depression     at age 45   Past Surgical History  Procedure Laterality Date  . Foot surgery      right  . Hand surgery     Family History  Problem Relation Age of Onset  . Alcoholism Mother   . CAD Father   . Depression Brother    Social History  Substance Use Topics  . Smoking status: Current Every Day Smoker -- 1.00 packs/day for 24 years    Types: Cigarettes  . Smokeless tobacco: Never Used  . Alcohol Use: No     Comment: "I'm a binge drinker.[name] 4-40 ounces/day"; last alcohol use 2 weeks ago (02/16/15)    Review of Systems  Constitutional: Negative for fever and chills.  HENT: Negative for facial swelling.   Respiratory: Negative for cough and  shortness of breath.   Cardiovascular: Negative for chest pain and leg swelling.  Gastrointestinal: Negative for nausea, vomiting and diarrhea.  Musculoskeletal: Negative for neck pain.  Skin: Negative for rash and wound.  Neurological: Positive for tremors, numbness and headaches. Negative for dizziness, seizures and weakness.  Psychiatric/Behavioral: Positive for suicidal ideas. Negative for hallucinations. The patient is nervous/anxious.   All other systems reviewed and are negative.     Allergies  Wellbutrin  Home Medications   Prior to Admission medications   Medication Sig Start Date End Date Taking? Authorizing Provider  acamprosate (CAMPRAL) 333 MG tablet Take 2 tablets (666 mg total) by mouth 3 (three) times daily with meals. 03/01/15  Yes Benjamine Mola, FNP  allopurinol (ZYLOPRIM) 100 MG tablet Take 1 tablet (100 mg total) by mouth daily. 03/01/15  Yes Benjamine Mola, FNP  famotidine (PEPCID) 20 MG tablet Take 1 tablet (20 mg total) by mouth 2 (two) times daily. Patient taking differently: Take 20 mg by mouth 2 (two) times daily as needed (for acid reflux).  02/09/15  Yes Shuvon B Rankin, NP  gabapentin (NEURONTIN) 400 MG capsule Take 1 capsule (400 mg total) by mouth 4 (four) times daily. 03/01/15  Yes Benjamine Mola, FNP  sertraline (  ZOLOFT) 25 MG tablet Take 3 tablets (75 mg total) by mouth daily. 03/12/15  Yes Hampton Abbot, MD  ziprasidone (GEODON) 60 MG capsule Take 1 capsule (60 mg total) by mouth 2 (two) times daily with a meal. 03/12/15  Yes Hampton Abbot, MD   BP 136/94 mmHg  Pulse 107  Temp(Src) 97.4 F (36.3 C) (Oral)  Resp 18  SpO2 98% Physical Exam  Constitutional: He is oriented to person, place, and time. He appears well-developed and well-nourished.  HENT:  Head: Normocephalic.  Mouth/Throat: Oropharynx is clear and moist.  Eyes: Pupils are equal, round, and reactive to light.  Neck: Normal range of motion.  Cardiovascular: Regular rhythm.  Tachycardia  present.   Pulmonary/Chest: Effort normal and breath sounds normal.  Abdominal: Soft. Bowel sounds are normal. He exhibits no distension. There is no tenderness.  Musculoskeletal: Normal range of motion. He exhibits no edema or tenderness.  Neurological: He is alert and oriented to person, place, and time. He has normal strength. No cranial nerve deficit or sensory deficit.  Skin: Skin is warm and dry.  Psychiatric: His speech is normal and behavior is normal. Judgment normal. His mood appears anxious. Cognition and memory are normal. He exhibits a depressed mood. He expresses suicidal ideation. He expresses suicidal plans.  Nursing note and vitals reviewed.   ED Course  Procedures (including critical care time) Labs Review Labs Reviewed  COMPREHENSIVE METABOLIC PANEL - Abnormal; Notable for the following:    Sodium 132 (*)    Chloride 85 (*)    CO2 21 (*)    Glucose, Bld 111 (*)    Total Protein 8.8 (*)    Albumin 5.2 (*)    AST 79 (*)    Total Bilirubin 1.8 (*)    Anion gap 26 (*)    All other components within normal limits  ETHANOL - Abnormal; Notable for the following:    Alcohol, Ethyl (B) 301 (*)    All other components within normal limits  ACETAMINOPHEN LEVEL - Abnormal; Notable for the following:    Acetaminophen (Tylenol), Serum <10 (*)    All other components within normal limits  CBC - Abnormal; Notable for the following:    WBC 11.0 (*)    MCHC 36.1 (*)    All other components within normal limits  URINE RAPID DRUG SCREEN, HOSP PERFORMED - Abnormal; Notable for the following:    Tetrahydrocannabinol POSITIVE (*)    All other components within normal limits  I-STAT CHEM 8, ED - Abnormal; Notable for the following:    Sodium 133 (*)    Chloride 92 (*)    Calcium, Ion 0.96 (*)    All other components within normal limits  SALICYLATE LEVEL    Imaging Review No results found. I have personally reviewed and evaluated these images and lab results as part of my  medical decision-making.   EKG Interpretation None     patient has been medically cleared for psych evaluation.  His head CT is negative .  His electrolytes have improved with IV fluids.  I feel this is a mild nutrition problem versus metabolic in nature  MDM   Final diagnoses:  Suicidal ideation  Intoxication         Junius Creamer, NP 04/23/15 1813  Leo Grosser, MD 04/25/15 [place], NP 04/26/15 1952  Leo Grosser, MD 04/26/15 2121

## 2015-04-23 NOTE — ED Notes (Signed)
Pt asking for pain medication.  This Agricultural consultant educated him that we could not provide medications until he is seen by a provider.  Pt verbalized understanding.  Lights dimmed for comfort.  Curtain opened for safety.

## 2015-04-24 DIAGNOSIS — F1020 Alcohol dependence, uncomplicated: Secondary | ICD-10-CM

## 2015-04-24 DIAGNOSIS — R45851 Suicidal ideations: Secondary | ICD-10-CM | POA: Diagnosis not present

## 2015-04-24 MED ORDER — IBUPROFEN 200 MG PO TABS
400.0000 mg | ORAL_TABLET | Freq: Three times a day (TID) | ORAL | Status: DC | PRN
  Administered 2015-04-24: 400 mg via ORAL
  Filled 2015-04-24: qty 2

## 2015-04-24 NOTE — Progress Notes (Signed)
Patient in bed awake since the beginning of the shift. He appeared sad and depressed. Endorsed mild withdrawal symptoms. Patient denied SI/HI and denied Hallucinations. Writer encouraged and supported patient. Patient receptive to encouragement and support. Q 15 minute check continues as ordered for safety.

## 2015-04-24 NOTE — Progress Notes (Signed)
Pt A & O X 3. Observed asleep in bed for majority of this shift. Denies HI and VH, but endorsed SI without plan and AH. Pt did contract for safety. Cooperative with unit routines. Compliant with ordered medications when administered. Ambulatory with a steady gait. Pt tolerated fluids offered. Pt showered, bed linens and scrubs changed. Availability, support and encouragement offered. Q 15 minutes checks remains effective for pt safety as ordered.

## 2015-04-24 NOTE — ED Notes (Signed)
Error in documentation---Wrong Emtala note for this pt.

## 2015-04-24 NOTE — ED Notes (Signed)
Patient resting with eyes closed. Respirations even and unlabored. No distress noted. Q 15 minute check continues as ordered for safety

## 2015-04-24 NOTE — Consult Note (Signed)
[facility] Consult   Reason for Consult:  Alcohol use disorder, severe, Dependence Referring Physician:  EDP Patient Identification: [name] MRN:  [id number] Principal Diagnosis: Alcohol use disorder, severe, dependence Diagnosis:   Patient Active Problem List   Diagnosis Date Noted  . Alcohol use disorder, severe, dependence [F10.20] 11/29/2014    Priority: High  . MDD (major depressive disorder), recurrent, severe, with psychosis [F33.3] 04/09/2015  . Suicidal ideation [R45.851] 04/09/2015  . Suicide ideation [R45.851]   . Major depressive disorder, recurrent episode, mild [F33.0]   . Alcohol dependence with alcohol-induced mood disorder [F10.24]   . Current smoker [Z72.0] 11/20/2014  . Tremors [G25.2] 11/03/2014  . Noncompliance with medication regimen [Z91.14]   . Post traumatic stress disorder (PTSD) [F43.10] 11/03/2011    Total Time spent with patient: 1 hour  Subjective:   [name] is a 44 y.o. male patient admitted with Alcohol use disorder, severe, Dependence  HPI:  Caucasian male, 44 years old was evaluated for Alcohol withdrawal and feeling suicidal.  His last Alcohol use was yesterday and his Alcohol level on arrival was 301 and his UDS is positive for Marijuana.  Today he is still feeling suicidal and unable to contract for safety.  Patient is known to our services and has had several ER and Inpatient visits and hospitalizations for Alcohol intoxication.  Patient is non compliance with his medications and and does not follow up with outpatient providers as advised.  Patient admitted to several Alcohol detox treatment but no outpatient rehabilitation treatment.   Patient reports drinking" a lot of Alcohol" but is not able to quantify his drinking.  Patient has a hx of Schizoaffective disorder but does not take his medications.  He reports poor sleep and appetite and have not eaten in several days.  He reports a hx of seizure disorder related to  Alcohol withdrawal.  He denies HI/AVH.  Patient has been accepted for admission and we are making a referral to [facility].    HPI Elements:   Location:  Alcohol use disorder, Schizoaffective disorder by hx. Quality:  severe. Severity:  severe. Timing:  Acute. Duration:  Chronic Alcoholism. Context:  Seeking treatment for Alcohol withdrawal and suicidal ideation..  Past Medical History:  Past Medical History  Diagnosis Date  . Psychosis   . PTSD (post-traumatic stress disorder)   . Seizure disorder     related to etoh seizure  . Alcohol abuse   . Schizoaffective disorder   . Anxiety     at age 43  . Depression     at age 76    Past Surgical History  Procedure Laterality Date  . Foot surgery      right  . Hand surgery     Family History:  Family History  Problem Relation Age of Onset  . Alcoholism Mother   . CAD Father   . Depression Brother    Social History:  History  Alcohol Use No    Comment: "I'm a binge drinker.[name] 4-40 ounces/day"; last alcohol use 2 weeks ago (02/16/15)     History  Drug Use  . 1.00 per week  . Special: Marijuana    Comment: THC 2 to 3 times per month; last marijuana use one month ago (02/16/15)    Social History   Social History  . Marital Status: Divorced    Spouse Name: N/A  . Number of Children: N/A  . Years of Education: N/A   Social History Main Topics  . Smoking  status: Current Every Day Smoker -- 1.00 packs/day for 24 years    Types: Cigarettes  . Smokeless tobacco: Never Used  . Alcohol Use: No     Comment: "I'm a binge drinker.[name] 4-40 ounces/day"; last alcohol use 2 weeks ago (02/16/15)  . Drug Use: 1.00 per week    Special: Marijuana     Comment: THC 2 to 3 times per month; last marijuana use one month ago (02/16/15)  . Sexual Activity: Yes   Other Topics Concern  . None   Social History Narrative   Additional Social History:    Pain Medications: See PTA Prescriptions: See PTA Over the Counter: See PTA History of alcohol /  drug use?: Yes Longest period of sobriety (when/how long): 3.5 years Negative Consequences of Use: Legal, Personal relationships Withdrawal Symptoms: Nausea / Vomiting, Tremors, Other (Comment) (headache) Date of most recent seizure: Several years ago Name of Substance 1: Alcohol 1 - Age of First Use: 14 1 - Amount (size/oz): Case 24 1 - Frequency: Daily 1 - Duration: ongoing 1 - Last Use / Amount: 04/23/15 Name of Substance 2: THC 2 - Age of First Use: 15 2 - Amount (size/oz): "a couple joints" 2 - Frequency: Ocasionally 2 - Duration: ongoing 2 - Last Use / Amount: 03/19/2015                 Allergies:   Allergies  Allergen Reactions  . Wellbutrin [Bupropion] Other (See Comments)    Makes me feel like I'm have a seizure    Labs:  Results for orders placed or performed during the hospital encounter of 04/23/15 (from the past 48 hour(s))  Comprehensive metabolic panel     Status: Abnormal   Collection Time: 04/23/15 12:30 PM  Result Value Ref Range   Sodium 132 (L) 135 - 145 mmol/L    Comment: REPEATED TO VERIFY   Potassium 4.6 3.5 - 5.1 mmol/L    Comment: REPEATED TO VERIFY   Chloride 85 (L) 101 - 111 mmol/L    Comment: REPEATED TO VERIFY   CO2 21 (L) 22 - 32 mmol/L    Comment: REPEATED TO VERIFY   Glucose, Bld 111 (H) 65 - 99 mg/dL   BUN 12 6 - 20 mg/dL   Creatinine, Ser 0.82 0.61 - 1.24 mg/dL   Calcium 9.3 8.9 - 10.3 mg/dL    Comment: REPEATED TO VERIFY   Total Protein 8.8 (H) 6.5 - 8.1 g/dL   Albumin 5.2 (H) 3.5 - 5.0 g/dL   AST 79 (H) 15 - 41 U/L   ALT 57 17 - 63 U/L   Alkaline Phosphatase 86 38 - 126 U/L   Total Bilirubin 1.8 (H) 0.3 - 1.2 mg/dL   GFR calc non Af Amer >60 >60 mL/min   GFR calc Af Amer >60 >60 mL/min    Comment: (NOTE) The eGFR has been calculated using the CKD EPI equation. This calculation has not been validated in all clinical situations. eGFR's persistently <60 mL/min signify possible Chronic Kidney Disease.    Anion gap 26 (H) 5 -  15    Comment: REPEATED TO VERIFY  Ethanol (ETOH)     Status: Abnormal   Collection Time: 04/23/15 12:30 PM  Result Value Ref Range   Alcohol, Ethyl (B) 301 (HH) <5 mg/dL    Comment:        LOWEST DETECTABLE LIMIT FOR SERUM ALCOHOL IS 5 mg/dL FOR MEDICAL PURPOSES ONLY CRITICAL RESULT CALLED TO, READ BACK BY AND VERIFIED  WITH: BROWN,L @ 1326 ON [id number] BY POTEAT,S   Salicylate level     Status: None   Collection Time: 04/23/15 12:30 PM  Result Value Ref Range   Salicylate Lvl <7.9 2.8 - 30.0 mg/dL  Acetaminophen level     Status: Abnormal   Collection Time: 04/23/15 12:30 PM  Result Value Ref Range   Acetaminophen (Tylenol), Serum <10 (L) 10 - 30 ug/mL    Comment:        THERAPEUTIC CONCENTRATIONS VARY SIGNIFICANTLY. A RANGE OF 10-30 ug/mL MAY BE AN EFFECTIVE CONCENTRATION FOR MANY PATIENTS. HOWEVER, SOME ARE BEST TREATED AT CONCENTRATIONS OUTSIDE THIS RANGE. ACETAMINOPHEN CONCENTRATIONS >150 ug/mL AT 4 HOURS AFTER INGESTION AND >50 ug/mL AT 12 HOURS AFTER INGESTION ARE OFTEN ASSOCIATED WITH TOXIC REACTIONS.   CBC     Status: Abnormal   Collection Time: 04/23/15 12:30 PM  Result Value Ref Range   WBC 11.0 (H) 4.0 - 10.5 K/uL   RBC 5.08 4.22 - 5.81 MIL/uL   Hemoglobin 16.7 13.0 - 17.0 g/dL   HCT 46.3 39.0 - 52.0 %   MCV 91.1 78.0 - 100.0 fL   MCH 32.9 26.0 - 34.0 pg   MCHC 36.1 (H) 30.0 - 36.0 g/dL   RDW 13.8 11.5 - 15.5 %   Platelets 314 150 - 400 K/uL  Urine rapid drug screen (hosp performed) (Not at [facility])     Status: Abnormal   Collection Time: 04/23/15 12:45 PM  Result Value Ref Range   Opiates NONE DETECTED NONE DETECTED   Cocaine NONE DETECTED NONE DETECTED   Benzodiazepines NONE DETECTED NONE DETECTED   Amphetamines NONE DETECTED NONE DETECTED   Tetrahydrocannabinol POSITIVE (A) NONE DETECTED   Barbiturates NONE DETECTED NONE DETECTED    Comment:        DRUG SCREEN FOR MEDICAL PURPOSES ONLY.  IF CONFIRMATION IS NEEDED FOR ANY PURPOSE, NOTIFY LAB WITHIN  5 DAYS.        LOWEST DETECTABLE LIMITS FOR URINE DRUG SCREEN Drug Class       Cutoff (ng/mL) Amphetamine      1000 Barbiturate      200 Benzodiazepine   390 Tricyclics       300 Opiates          300 Cocaine          300 THC              50   I-stat chem 8, ed     Status: Abnormal   Collection Time: 04/23/15  5:45 PM  Result Value Ref Range   Sodium 133 (L) 135 - 145 mmol/L   Potassium 3.5 3.5 - 5.1 mmol/L   Chloride 92 (L) 101 - 111 mmol/L   BUN 9 6 - 20 mg/dL   Creatinine, Ser 1.10 0.61 - 1.24 mg/dL   Glucose, Bld 86 65 - 99 mg/dL   Calcium, Ion 0.96 (L) 1.12 - 1.23 mmol/L   TCO2 23 0 - 100 mmol/L   Hemoglobin 16.0 13.0 - 17.0 g/dL   HCT 47.0 39.0 - 52.0 %    Vitals: Blood pressure 117/77, pulse 88, temperature 98.1 F (36.7 C), temperature source Oral, resp. rate 16, SpO2 100 %.  Risk to Self: Suicidal Ideation: Yes-Currently Present Suicidal Intent: No-Not Currently/Within Last 6 Months Is patient at risk for suicide?: Yes Suicidal Plan?: No-Not Currently/Within Last 6 Months Specify Current Suicidal Plan: stab himself in neck Access to Means: Yes Specify Access to Suicidal Means: knife What has been your  use of drugs/alcohol within the last 12 months?: see Sa section How many times?: 3 Other Self Harm Risks: none known Triggers for Past Attempts: Hallucinations, Unpredictable Intentional Self Injurious Behavior: Burning Comment - Self Injurious Behavior:  (burns self with cigarettes) Risk to Others: Homicidal Ideation: No Thoughts of Harm to Others: No Current Homicidal Intent: No Current Homicidal Plan: No Access to Homicidal Means: No History of harm to others?: No Assessment of Violence: None Noted Does patient have access to weapons?: No Criminal Charges Pending?: No Does patient have a court date: No Prior Inpatient Therapy: Prior Inpatient Therapy: Yes Prior Therapy Dates: Multiple Prior Therapy Facilty/Provider(s): [facility] Reason for Treatment: SA and  Schizoaffective disorder Prior Outpatient Therapy: Prior Outpatient Therapy: Yes Prior Therapy Dates: 2012-Present Prior Therapy Facilty/Provider(s): [facility] Reason for Treatment: SA/Schizoaffective Disorder Does patient have an ACCT team?: No Does patient have Intensive In-House Services?  : No Does patient have [facility] services? : No Does patient have P4CC services?: No  Current Facility-Administered Medications  Medication Dose Route Frequency Provider Last Rate Last Dose  . acamprosate (CAMPRAL) tablet 666 mg  666 mg Oral TID WC Junius Creamer, NP   666 mg at 04/24/15 1225  . allopurinol (ZYLOPRIM) tablet 100 mg  100 mg Oral Daily Junius Creamer, NP   100 mg at 04/24/15 1049  . famotidine (PEPCID) tablet 20 mg  20 mg Oral BID Junius Creamer, NP   20 mg at 04/24/15 1011  . gabapentin (NEURONTIN) capsule 400 mg  400 mg Oral QID Junius Creamer, NP   400 mg at 04/24/15 1442  . ibuprofen (ADVIL,MOTRIN) tablet 400 mg  400 mg Oral Q8H PRN Aedyn Mckeon   400 mg at 04/24/15 1011  . LORazepam (ATIVAN) tablet 0-4 mg  0-4 mg Oral 4 times per day Junius Creamer, NP   1 mg at 04/24/15 1226   Followed by  . [START ON 04/25/2015] LORazepam (ATIVAN) tablet 0-4 mg  0-4 mg Oral Q12H Junius Creamer, NP      . sertraline (ZOLOFT) tablet 75 mg  75 mg Oral Daily Junius Creamer, NP   75 mg at 04/24/15 1010  . ziprasidone (GEODON) capsule 60 mg  60 mg Oral BID WC Junius Creamer, NP   60 mg at 04/24/15 4665   Current Outpatient Prescriptions  Medication Sig Dispense Refill  . acamprosate (CAMPRAL) 333 MG tablet Take 2 tablets (666 mg total) by mouth 3 (three) times daily with meals. 180 tablet 0  . allopurinol (ZYLOPRIM) 100 MG tablet Take 1 tablet (100 mg total) by mouth daily. 30 tablet 3  . famotidine (PEPCID) 20 MG tablet Take 1 tablet (20 mg total) by mouth 2 (two) times daily. (Patient taking differently: Take 20 mg by mouth 2 (two) times daily as needed (for acid reflux). ) 30 tablet 0  . gabapentin (NEURONTIN) 400 MG capsule  Take 1 capsule (400 mg total) by mouth 4 (four) times daily. 120 capsule 0  . naproxen (NAPROSYN) 500 MG tablet Take 1 tablet (500 mg total) by mouth 2 (two) times daily with a meal. (Patient taking differently: Take 500 mg by mouth 2 (two) times daily as needed (for pain.). ) 60 tablet 2  . sertraline (ZOLOFT) 25 MG tablet Take 3 tablets (75 mg total) by mouth daily. 90 tablet 0  . ziprasidone (GEODON) 60 MG capsule Take 1 capsule (60 mg total) by mouth 2 (two) times daily with a meal. 60 capsule 0    Musculoskeletal: Strength & Muscle Tone: within  normal limits Gait & Station: normal Patient leans: N/A  Psychiatric Specialty Exam: Physical Exam  Cardiovascular: Intact distal pulses.     Review of Systems  Constitutional: Negative.   HENT: Negative.   Eyes: Negative.   Respiratory: Negative.   Cardiovascular: Negative.   Gastrointestinal: Negative.   Genitourinary: Negative.   Musculoskeletal: Negative.   Skin: Negative.   Neurological: Negative.   Endo/Heme/Allergies: Negative.     Blood pressure 117/77, pulse 88, temperature 98.1 F (36.7 C), temperature source Oral, resp. rate 16, SpO2 100 %.There is no weight on file to calculate BMI.  General Appearance: Casual and Disheveled  Eye Contact::  Minimal  Speech:  Clear and Coherent and Slow  Volume:  Decreased  Mood:  Anxious, Depressed and Hopeless  Affect:  Congruent, Depressed and Flat  Thought Process:  Coherent, Goal Directed and Intact  Orientation:  Full (Time, Place, and Person)  Thought Content:  WDL  Suicidal Thoughts:  Yes.  without intent/plan  Homicidal Thoughts:  No  Memory:  Immediate;   Fair Recent;   Fair Remote;   Fair  Judgement:  Impaired  Insight:  Shallow  Psychomotor Activity:  Tremor  Concentration:  Fair  Recall:  NA  Fund of Knowledge:Poor  Language: Fair  Akathisia:  NA  Handed:  Right  AIMS (if indicated):     Assets:  Desire for Improvement  ADL's:  Impaired  Cognition: WNL   Sleep:      Medical Decision Making: Review of Psycho-Social Stressors (1)  Treatment Plan Summary: Daily contact with patient to assess and evaluate symptoms and progress in treatment and Medication management  Plan:  Resume home medications, Initiated Alcohol detox treatment using Ativan Disposition:  Admit, seek placement  Delfin Gant   PMHNP-BC 04/24/2015 3:42 PM Patient seen face-to-face for psychiatric evaluation, chart reviewed and case discussed with the physician extender and developed treatment plan. Reviewed the information documented and agree with the treatment plan. Corena Pilgrim, MD

## 2015-04-24 NOTE — Progress Notes (Signed)
CSW referred patient to [facility] upon NP's request.  CSW obtained patient's Authorizations number from [place], which is : [id number]  Willette Brace [phone] ED CSW 04/24/2015 6:02 PM

## 2015-04-25 NOTE — BH Assessment (Signed)
04/25/2015 Reassessment:  Writer met with patient to complete a reassessment. Patient denies SI, HI, and AVH's. Patient sts that he is not interested in remaining in the ED or going to [facility] for treatment. Patient requesting to discharge home. Sts that his mother is coming to visit and he wants to leave to be with her.    Writer will update Dr. Darleene Cleaver and Reginold Agent, NP.

## 2015-04-25 NOTE — BHH Suicide Risk Assessment (Cosign Needed)
Suicide Risk Assessment  Discharge Assessment   [facility] Discharge Suicide Risk Assessment   Demographic Factors:  Male, Caucasian, Low socioeconomic status, Living alone and Unemployed  Total Time spent with patient: 20 minutes  Musculoskeletal: Strength & Muscle Tone: within normal limits Gait & Station: normal Patient leans: N/A  Psychiatric Specialty Exam:     Blood pressure 136/94, pulse 107, temperature 97.4 F (36.3 C), temperature source Oral, resp. rate 18, SpO2 98 %.There is no weight on file to calculate BMI.  General Appearance: Casual and Disheveled  Eye Contact:: Minimal  Speech: Clear and Coherent and Slow  Volume:Normal  Mood:Depressed  Affect: Congruent, Depressed   Thought Process: Coherent, Goal Directed and Intact  Orientation: Full (Time, Place, and Person)  Thought Content: WDL  Suicidal Thoughts:Denies  Homicidal Thoughts: No  Memory: Immediate; Fair Recent; Fair Remote; Fair  Judgement: fair  Insight: Shallow  Psychomotor Activity: Normal  Concentration: Fair  Recall: NA  Fund of Knowledge:  Fair  Language: Fair  Akathisia: NA  Handed: Right  AIMS (if indicated):    Assets: Desire for Improvement  ADL's: Impaired  Cognition: WNL        Has this patient used any form of tobacco in the last 30 days? (Cigarettes, Smokeless Tobacco, Cigars, and/or Pipes) Yes, A prescription for an FDA-approved tobacco cessation medication was offered at discharge and the patient refused  Mental Status Per Nursing Assessment::   On Admission:     Current Mental Status by Physician: NA  Loss Factors: NA  Historical Factors: NA  Risk Reduction Factors:   Sense of responsibility to family, Living with another person, especially a relative and Moving in with his mother.  Patient states both of them takes care of each other.  Continued Clinical Symptoms:  Depression:   Comorbid alcohol  abuse/dependence Insomnia Alcohol/Substance Abuse/Dependencies Previous Psychiatric Diagnoses and Treatments  Cognitive Features That Contribute To Risk:  Polarized thinking    Suicide Risk:  Minimal: No identifiable suicidal ideation.  Patients presenting with no risk factors but with morbid ruminations; may be classified as minimal risk based on the severity of the depressive symptoms  Principal Problem: Alcohol use disorder, severe, dependence Discharge Diagnoses:  Patient Active Problem List   Diagnosis Date Noted  . Alcohol use disorder, severe, dependence [F10.20] 11/29/2014    Priority: High  . MDD (major depressive disorder), recurrent, severe, with psychosis [F33.3] 04/09/2015  . Suicidal ideation [R45.851] 04/09/2015  . Suicide ideation [R45.851]   . Major depressive disorder, recurrent episode, mild [F33.0]   . Alcohol dependence with alcohol-induced mood disorder [F10.24]   . Current smoker [Z72.0] 11/20/2014  . Tremors [G25.2] 11/03/2014  . Noncompliance with medication regimen [Z91.14]   . Post traumatic stress disorder (PTSD) [F43.10] 11/03/2011    Follow-up Information    Go to [facility]   Specialty:  [facility] information:   [place] [address] [phone]       Plan Of Care/Follow-up recommendations:  Activity:  as tolerated Diet:  regular  Is patient on multiple antipsychotic therapies at discharge:  No   Has Patient had three or more failed trials of antipsychotic monotherapy by history:  No  Recommended Plan for Multiple Antipsychotic Therapies: NA    Flynt Breeze C   PMHNP-BC 04/25/2015, 2:41 PM

## 2015-04-25 NOTE — ED Notes (Signed)
Patient  Denied withdrawal symptoms this morning. He reported that he is feeling better and ready to go home, he was well rested during the night. Q 15 minutes continues as ordered for safety.

## 2015-04-25 NOTE — Progress Notes (Signed)
Pt declining treatment and requesting to be discharged. Pt recommended to follow up with [facility], pt current provider and given outpatient substance abuse resources.   Belia Heman, [place] Work  [employer] [phone]

## 2015-04-25 NOTE — Consult Note (Signed)
  Psychiatric Specialty Exam: Physical Exam  ROS  Blood pressure 134/83, pulse 84, temperature 97.8 F (36.6 C), temperature source Oral, resp. rate 20, SpO2 97 %.There is no weight on file to calculate BMI.  General Appearance: Casual and Disheveled  Eye Contact:: Minimal  Speech: Clear and Coherent and Slow  Volume:Normal  Mood:Depressed  Affect: Congruent, Depressed   Thought Process: Coherent, Goal Directed and Intact  Orientation: Full (Time, Place, and Person)  Thought Content: WDL  Suicidal Thoughts:Denies  Homicidal Thoughts: No  Memory: Immediate; Fair Recent; Fair Remote; Fair  Judgement: fair  Insight: Shallow  Psychomotor Activity: Normal  Concentration: Fair  Recall: NA  Fund of Knowledge:  Fair  Language: Fair  Akathisia: NA  Handed: Right  AIMS (if indicated):    Assets: Desire for Improvement  ADL's: Impaired  Cognition: WNL           Patient declined admission to [facility] and he was also declined there for substance abuse care because he did not.  Patient has safely completed his detox with a CIWA score of 0.  He reports good sleep and fairly good appetite.  He denies SI/HI/AVH.  He has agreed to resume his medications and will follow up with [facility] for his [place] needs.  Patient also promises to participate in [place] meetings.  Alcohol use disorder, severe, dependence   Plan:  Discharge home, follow up with [facility], Participate in [place] meetings as planned.  Charmaine Downs   PMHNP-BC Patient seen face-to-face for psychiatric evaluation, chart reviewed and case discussed with the physician extender and developed treatment plan. Reviewed the information documented and agree with the treatment plan. Corena Pilgrim, MD

## 2015-05-05 ENCOUNTER — Encounter (HOSPITAL_COMMUNITY): Payer: Self-pay | Admitting: *Deleted

## 2015-05-05 ENCOUNTER — Observation Stay (HOSPITAL_COMMUNITY)
Admission: AD | Admit: 2015-05-05 | Discharge: 2015-05-06 | Disposition: A | Discharge status: Home / Self Care | Payer: Medicare Other | Source: Intra-hospital | Attending: Psychiatry | Admitting: Psychiatry

## 2015-05-05 ENCOUNTER — Encounter (HOSPITAL_COMMUNITY): Payer: Self-pay | Admitting: Emergency Medicine

## 2015-05-05 ENCOUNTER — Emergency Department (HOSPITAL_COMMUNITY)
Admission: EM | Admit: 2015-05-05 | Discharge: 2015-05-05 | Disposition: A | Discharge status: Other Acute Inpatient Hospital | Payer: Medicare Other | Attending: Emergency Medicine | Admitting: Emergency Medicine

## 2015-05-05 DIAGNOSIS — Z79899 Other long term (current) drug therapy: Secondary | ICD-10-CM | POA: Insufficient documentation

## 2015-05-05 DIAGNOSIS — F259 Schizoaffective disorder, unspecified: Secondary | ICD-10-CM | POA: Diagnosis not present

## 2015-05-05 DIAGNOSIS — F4310 Post-traumatic stress disorder, unspecified: Secondary | ICD-10-CM | POA: Insufficient documentation

## 2015-05-05 DIAGNOSIS — G40909 Epilepsy, unspecified, not intractable, without status epilepticus: Secondary | ICD-10-CM | POA: Insufficient documentation

## 2015-05-05 DIAGNOSIS — F10239 Alcohol dependence with withdrawal, unspecified: Secondary | ICD-10-CM | POA: Diagnosis present

## 2015-05-05 DIAGNOSIS — G629 Polyneuropathy, unspecified: Secondary | ICD-10-CM | POA: Diagnosis not present

## 2015-05-05 DIAGNOSIS — F329 Major depressive disorder, single episode, unspecified: Secondary | ICD-10-CM | POA: Diagnosis not present

## 2015-05-05 DIAGNOSIS — K219 Gastro-esophageal reflux disease without esophagitis: Secondary | ICD-10-CM | POA: Insufficient documentation

## 2015-05-05 DIAGNOSIS — R45851 Suicidal ideations: Secondary | ICD-10-CM | POA: Diagnosis present

## 2015-05-05 DIAGNOSIS — F1210 Cannabis abuse, uncomplicated: Secondary | ICD-10-CM | POA: Insufficient documentation

## 2015-05-05 DIAGNOSIS — F1024 Alcohol dependence with alcohol-induced mood disorder: Principal | ICD-10-CM | POA: Insufficient documentation

## 2015-05-05 DIAGNOSIS — F419 Anxiety disorder, unspecified: Secondary | ICD-10-CM | POA: Insufficient documentation

## 2015-05-05 DIAGNOSIS — M109 Gout, unspecified: Secondary | ICD-10-CM | POA: Insufficient documentation

## 2015-05-05 DIAGNOSIS — F1094 Alcohol use, unspecified with alcohol-induced mood disorder: Secondary | ICD-10-CM | POA: Diagnosis present

## 2015-05-05 DIAGNOSIS — F17210 Nicotine dependence, cigarettes, uncomplicated: Secondary | ICD-10-CM | POA: Diagnosis not present

## 2015-05-05 DIAGNOSIS — F332 Major depressive disorder, recurrent severe without psychotic features: Secondary | ICD-10-CM | POA: Diagnosis not present

## 2015-05-05 DIAGNOSIS — F29 Unspecified psychosis not due to a substance or known physiological condition: Secondary | ICD-10-CM | POA: Insufficient documentation

## 2015-05-05 DIAGNOSIS — Z9114 Patient's other noncompliance with medication regimen: Secondary | ICD-10-CM | POA: Insufficient documentation

## 2015-05-05 DIAGNOSIS — F1010 Alcohol abuse, uncomplicated: Secondary | ICD-10-CM | POA: Diagnosis not present

## 2015-05-05 DIAGNOSIS — F1290 Cannabis use, unspecified, uncomplicated: Secondary | ICD-10-CM | POA: Insufficient documentation

## 2015-05-05 DIAGNOSIS — R109 Unspecified abdominal pain: Secondary | ICD-10-CM | POA: Diagnosis not present

## 2015-05-05 DIAGNOSIS — F1020 Alcohol dependence, uncomplicated: Secondary | ICD-10-CM | POA: Diagnosis present

## 2015-05-05 DIAGNOSIS — Z720 Tobacco use: Secondary | ICD-10-CM | POA: Insufficient documentation

## 2015-05-05 LAB — COMPREHENSIVE METABOLIC PANEL
ALT: 69 U/L — ABNORMAL HIGH (ref 17–63)
AST: 41 U/L (ref 15–41)
Albumin: 5 g/dL (ref 3.5–5.0)
Alkaline Phosphatase: 92 U/L (ref 38–126)
Anion gap: 14 (ref 5–15)
BUN: 8 mg/dL (ref 6–20)
CO2: 23 mmol/L (ref 22–32)
Calcium: 9.5 mg/dL (ref 8.9–10.3)
Chloride: 103 mmol/L (ref 101–111)
Creatinine, Ser: 0.74 mg/dL (ref 0.61–1.24)
GFR calc Af Amer: >60 mL/min (ref >60)
GFR calc non Af Amer: >60 mL/min (ref >60)
Glucose, Bld: 89 mg/dL (ref 65–99)
Potassium: 4 mmol/L (ref 3.5–5.1)
Sodium: 140 mmol/L (ref 135–145)
Total Bilirubin: 0.5 mg/dL (ref 0.3–1.2)
Total Protein: 8.7 g/dL — ABNORMAL HIGH (ref 6.5–8.1)

## 2015-05-05 LAB — SALICYLATE LEVEL: Salicylate Lvl: <4 mg/dL (ref 2.8–30.0)

## 2015-05-05 LAB — CBC
HCT: 48.2 % (ref 39.0–52.0)
Hemoglobin: 16.7 g/dL (ref 13.0–17.0)
MCH: 32.5 pg (ref 26.0–34.0)
MCHC: 34.6 g/dL (ref 30.0–36.0)
MCV: 93.8 fL (ref 78.0–100.0)
Platelets: 446 10*3/uL — ABNORMAL HIGH (ref 150–400)
RBC: 5.14 MIL/uL (ref 4.22–5.81)
RDW: 14.5 % (ref 11.5–15.5)
WBC: 7.3 10*3/uL (ref 4.0–10.5)

## 2015-05-05 LAB — RAPID URINE DRUG SCREEN, HOSP PERFORMED
Amphetamines: NOT DETECTED
Barbiturates: NOT DETECTED
Benzodiazepines: NOT DETECTED
Cocaine: NOT DETECTED
Opiates: NOT DETECTED
Tetrahydrocannabinol: POSITIVE — AB

## 2015-05-05 LAB — ACETAMINOPHEN LEVEL: Acetaminophen (Tylenol), Serum: <10 ug/mL — ABNORMAL LOW (ref 10–30)

## 2015-05-05 LAB — ETHANOL: Alcohol, Ethyl (B): 204 mg/dL — ABNORMAL HIGH (ref <5)

## 2015-05-05 MED ORDER — VITAMIN B-1 100 MG PO TABS
100.0000 mg | ORAL_TABLET | Freq: Every day | ORAL | Status: DC
  Administered 2015-05-05: 100 mg via ORAL
  Filled 2015-05-05: qty 1

## 2015-05-05 MED ORDER — ZIPRASIDONE HCL 60 MG PO CAPS
ORAL_CAPSULE | ORAL | Status: AC
  Administered 2015-05-05: 60 mg
  Filled 2015-05-05: qty 1

## 2015-05-05 MED ORDER — LORAZEPAM 2 MG/ML IJ SOLN: 0.0000 mg | Freq: Two times a day (BID) | INTRAMUSCULAR | Status: DC

## 2015-05-05 MED ORDER — LORAZEPAM 1 MG PO TABS
0.0000 mg | ORAL_TABLET | Freq: Four times a day (QID) | ORAL | Status: DC
  Administered 2015-05-05 (×2): 1 mg via ORAL
  Filled 2015-05-05 (×2): qty 1

## 2015-05-05 MED ORDER — VITAMIN B-1 100 MG PO TABS: 100.0000 mg | ORAL_TABLET | Freq: Every day | ORAL | Status: DC

## 2015-05-05 MED ORDER — ACAMPROSATE CALCIUM 333 MG PO TBEC
666.0000 mg | DELAYED_RELEASE_TABLET | Freq: Three times a day (TID) | ORAL | Status: DC
  Administered 2015-05-06 (×2): 666 mg via ORAL
  Filled 2015-05-05 (×7): qty 2

## 2015-05-05 MED ORDER — SERTRALINE HCL 50 MG PO TABS: 75.0000 mg | ORAL_TABLET | Freq: Every day | ORAL | Status: DC

## 2015-05-05 MED ORDER — GABAPENTIN 300 MG PO CAPS: 300.0000 mg | ORAL_CAPSULE | Freq: Three times a day (TID) | ORAL | Status: DC

## 2015-05-05 MED ORDER — NICOTINE 21 MG/24HR TD PT24
21.0000 mg | MEDICATED_PATCH | Freq: Every day | TRANSDERMAL | Status: DC
  Administered 2015-05-05 – 2015-05-06 (×2): 21 mg via TRANSDERMAL
  Filled 2015-05-05 (×2): qty 1

## 2015-05-05 MED ORDER — LORAZEPAM 2 MG/ML IJ SOLN: 0.0000 mg | Freq: Four times a day (QID) | INTRAMUSCULAR | Status: DC

## 2015-05-05 MED ORDER — ZIPRASIDONE HCL 60 MG PO CAPS: 60.0000 mg | ORAL_CAPSULE | Freq: Two times a day (BID) | ORAL | Status: DC

## 2015-05-05 MED ORDER — ALLOPURINOL 100 MG PO TABS
100.0000 mg | ORAL_TABLET | Freq: Every day | ORAL | Status: DC
  Filled 2015-05-05: qty 1

## 2015-05-05 MED ORDER — LORAZEPAM 1 MG PO TABS
2.0000 mg | ORAL_TABLET | Freq: Once | ORAL | Status: AC
  Administered 2015-05-05: 2 mg via ORAL
  Filled 2015-05-05: qty 2

## 2015-05-05 MED ORDER — ALLOPURINOL 100 MG PO TABS
100.0000 mg | ORAL_TABLET | Freq: Every day | ORAL | Status: DC
  Administered 2015-05-06: 100 mg via ORAL
  Filled 2015-05-05 (×4): qty 1

## 2015-05-05 MED ORDER — VITAMIN B-1 100 MG PO TABS
100.0000 mg | ORAL_TABLET | Freq: Every day | ORAL | Status: DC
  Administered 2015-05-05 – 2015-05-06 (×2): 100 mg via ORAL
  Filled 2015-05-05 (×2): qty 1

## 2015-05-05 MED ORDER — LORAZEPAM 1 MG PO TABS
0.0000 mg | ORAL_TABLET | Freq: Four times a day (QID) | ORAL | Status: AC
  Administered 2015-05-05: 2 mg via ORAL
  Filled 2015-05-05: qty 2
  Filled 2015-05-05: qty 1

## 2015-05-05 MED ORDER — ACETAMINOPHEN 325 MG PO TABS: 650.0000 mg | ORAL_TABLET | Freq: Four times a day (QID) | ORAL | Status: DC | PRN

## 2015-05-05 MED ORDER — ZIPRASIDONE HCL 60 MG PO CAPS
60.0000 mg | ORAL_CAPSULE | Freq: Two times a day (BID) | ORAL | Status: DC
  Administered 2015-05-06: 60 mg via ORAL
  Filled 2015-05-05: qty 1

## 2015-05-05 MED ORDER — GABAPENTIN 400 MG PO CAPS
400.0000 mg | ORAL_CAPSULE | Freq: Four times a day (QID) | ORAL | Status: DC
  Administered 2015-05-05: 300 mg via ORAL
  Administered 2015-05-06 (×2): 400 mg via ORAL
  Filled 2015-05-05 (×2): qty 1

## 2015-05-05 MED ORDER — THIAMINE HCL 100 MG/ML IJ SOLN: 100.0000 mg | Freq: Every day | INTRAMUSCULAR | Status: DC

## 2015-05-05 MED ORDER — ALLOPURINOL 100 MG PO TABS: 100.0000 mg | ORAL_TABLET | Freq: Every day | ORAL | Status: DC

## 2015-05-05 MED ORDER — FAMOTIDINE 20 MG PO TABS: 20.0000 mg | ORAL_TABLET | Freq: Two times a day (BID) | ORAL | Status: DC | PRN

## 2015-05-05 MED ORDER — ZIPRASIDONE HCL 20 MG PO CAPS: 60.0000 mg | ORAL_CAPSULE | Freq: Two times a day (BID) | ORAL | Status: DC

## 2015-05-05 MED ORDER — ONDANSETRON HCL 4 MG PO TABS: 4.0000 mg | ORAL_TABLET | Freq: Three times a day (TID) | ORAL | Status: DC | PRN

## 2015-05-05 MED ORDER — LORAZEPAM 1 MG PO TABS: 0.0000 mg | ORAL_TABLET | Freq: Two times a day (BID) | ORAL | Status: DC

## 2015-05-05 MED ORDER — MAGNESIUM HYDROXIDE 400 MG/5ML PO SUSP: 30.0000 mL | Freq: Every day | ORAL | Status: DC | PRN

## 2015-05-05 MED ORDER — ALUM & MAG HYDROXIDE-SIMETH 200-200-20 MG/5ML PO SUSP: 30.0000 mL | ORAL | Status: DC | PRN

## 2015-05-05 MED ORDER — LORAZEPAM 1 MG PO TABS
0.0000 mg | ORAL_TABLET | Freq: Two times a day (BID) | ORAL | Status: DC
  Administered 2015-05-06: 1 mg via ORAL

## 2015-05-05 MED ORDER — SERTRALINE HCL 50 MG PO TABS
75.0000 mg | ORAL_TABLET | Freq: Every day | ORAL | Status: DC
  Administered 2015-05-05 – 2015-05-06 (×2): 75 mg via ORAL
  Filled 2015-05-05 (×4): qty 1

## 2015-05-05 MED ORDER — GABAPENTIN 300 MG PO CAPS
ORAL_CAPSULE | ORAL | Status: AC
  Administered 2015-05-05: 300 mg via ORAL
  Filled 2015-05-05: qty 1

## 2015-05-05 NOTE — Progress Notes (Signed)
[name]  Reason for Crisis Plan:  Crisis Stabilization   Plan of Care:  Referral for Substance Abuse  Family Support:      Current Living Environment:  Living Arrangements: Alone  Insurance:   Hospital Account    Name Acct ID Class Status Primary Coverage   [name], [name] [id number] [facility] - MEDICARE PART A AND B        Guarantor Account (for Hospital Account [id number])    Name Relation to Pt Service Area Active? Acct Type   [name]   Address Phone       [address]  [place], [place] [address] [phone]          Coverage Information (for Hospital Account [id number])    F/O Payor/Plan Precert #   MEDICARE/MEDICARE PART A AND B    Subscriber Subscriber #   [name] [id number]   Address Phone   [address] [place], [place] [address]       Legal Guardian:     Primary Care Provider:  Minerva Ends, MD  Current Outpatient Providers:  [name]  Psychiatrist:     Counselor/Therapist:     Compliant with Medications:  Yes  Additional Information:   [name] 9/17/20166:27 PM

## 2015-05-05 NOTE — Progress Notes (Signed)
Pt presented to unit alert and cooperative. C/o depression, anxiety, insomnia, isolation and feelings of guilt. -SI/HI, +A/[place] "I see shadows around". Request alcohol detox, reports drinking a case of beer daily. Emotional support and encouragement given. Will monitor closely and evaluate for stabilization. AUDIT score 25, MD notified by note on chart, Alcohol Education use sheet reviewed with pt.
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
## 2015-05-05 NOTE — ED Notes (Signed)
Pt presents with alcohol withdraws and suicidal ideations with a plan of stabbing himself in the neck. Says he would do this with kitchen knives. Denies homicidal thoughts. Pt reports visual hallucinations-seeing "shadow people." Hx Schizoaffective disorder. Takes Geodon, Neurontin and Zoloft. Averages a case of beer per day-last ETOH drink was yesterday at "some time." Denies any changes in support system or life changes. Pt lives by himself and says he does have a good support system. No other c/c.

## 2015-05-05 NOTE — BHH Counselor (Signed)
Patient was accepted to Observation Unit Bed 3, per Debarah Crape, RN, [facility]. Dr. [name] accepting. Patient reviewed and signed voluntary admission and consent to treat for observation unit and paperwork was faxed to [name]. Patients nurse informed. Contact Debarah Crape, [place], [facility] for time for transportation for admissions. Patients nurse informed.paperworjk provided to nurse for transport to [facility].  Rosalin Hawking, LCSW Therapeutic Triage Specialist [place] 05/05/2015 12:04 PM

## 2015-05-05 NOTE — BH Assessment (Signed)
Assessment completed. Consulted Arlester Marker, NP who recommended inpatient treatment. Alvina Chou, PA-C has been informed of the recommendation.
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
## 2015-05-05 NOTE — ED Notes (Signed)
Report called to [place] in [place].  Patient will be transported via [place].

## 2015-05-05 NOTE — ED Notes (Signed)
Pt stated "I am having alcohol withdrawals and I am feeling suicidal". Pt reported that he has been experiencing suicidal ideations for the past 2 days. Pt is endorsing suicidal ideations with a plan to stab self in the neck with a kitchen knife. Plan of care discussed. Patient voices no complaints or concerns at this time. Encouragement and support provided and safety maintain. Q 15 min safety checks remain in place.

## 2015-05-05 NOTE — ED Provider Notes (Signed)
CSN: [id number]     Arrival date & time 05/05/15  0119 History   First MD Initiated Contact with Patient 05/05/15 0157     Chief Complaint  Patient presents with  . Alcohol Problem  . Detox   . Suicidal  . Abdominal Pain     (Consider location/radiation/quality/duration/timing/severity/associated sxs/prior Treatment) HPI Comments: Patient is a 44 year old male with past medical history of alcoholism, PTSD, and depression who presents for detox from alcohol and suicidal ideations. Patient reports drinking 24 cans of beer per day and has a history of seizures when he withdrawals. Last drink was today. He reports suicidal ideations as well. He reports he would stab himself in the neck. No HI. No drug use.   Patient is a 44 y.o. male presenting with alcohol problem and abdominal pain.  Alcohol Problem Associated symptoms include abdominal pain.  Abdominal Pain   Past Medical History  Diagnosis Date  . Psychosis   . PTSD (post-traumatic stress disorder)   . Seizure disorder     related to etoh seizure  . Alcohol abuse   . Schizoaffective disorder   . Anxiety     at age 68  . Depression     at age 66   Past Surgical History  Procedure Laterality Date  . Foot surgery      right  . Hand surgery     Family History  Problem Relation Age of Onset  . Alcoholism Mother   . CAD Father   . Depression Brother    Social History  Substance Use Topics  . Smoking status: Current Every Day Smoker -- 1.00 packs/day for 24 years    Types: Cigarettes  . Smokeless tobacco: Never Used  . Alcohol Use: No     Comment: "I'm a binge drinker.[name] 4-40 ounces/day"; last alcohol use 2 weeks ago (02/16/15)    Review of Systems  Gastrointestinal: Positive for abdominal pain.  Psychiatric/Behavioral: Positive for suicidal ideas.  All other systems reviewed and are negative.     Allergies  Wellbutrin  Home Medications   Prior to Admission medications   Medication Sig Start Date End Date  Taking? Authorizing Provider  acamprosate (CAMPRAL) 333 MG tablet Take 2 tablets (666 mg total) by mouth 3 (three) times daily with meals. 03/01/15   Benjamine Mola, FNP  allopurinol (ZYLOPRIM) 100 MG tablet Take 1 tablet (100 mg total) by mouth daily. 03/01/15   Benjamine Mola, FNP  famotidine (PEPCID) 20 MG tablet Take 1 tablet (20 mg total) by mouth 2 (two) times daily. Patient taking differently: Take 20 mg by mouth 2 (two) times daily as needed (for acid reflux).  02/09/15   Shuvon B Rankin, NP  gabapentin (NEURONTIN) 400 MG capsule Take 1 capsule (400 mg total) by mouth 4 (four) times daily. 03/01/15   Benjamine Mola, FNP  sertraline (ZOLOFT) 25 MG tablet Take 3 tablets (75 mg total) by mouth daily. 03/12/15   Hampton Abbot, MD  ziprasidone (GEODON) 60 MG capsule Take 1 capsule (60 mg total) by mouth 2 (two) times daily with a meal. 03/12/15   Hampton Abbot, MD   BP 143/83 mmHg  Pulse 101  Temp(Src) 98.4 F (36.9 C) (Oral)  Resp 17  SpO2 95% Physical Exam  Constitutional: He is oriented to person, place, and time. He appears well-developed and well-nourished. No distress.  HENT:  Head: Normocephalic and atraumatic.  Eyes: Conjunctivae and EOM are normal.  Neck: Normal range of motion.  Cardiovascular:  Normal rate and regular rhythm.  Exam reveals no gallop and no friction rub.   No murmur heard. Pulmonary/Chest: Effort normal and breath sounds normal. He has no wheezes. He has no rales. He exhibits no tenderness.  Abdominal: Soft. He exhibits no distension. There is no tenderness.  Musculoskeletal: Normal range of motion.  Neurological: He is alert and oriented to person, place, and time. Coordination normal.  Speech is goal-oriented. Moves limbs without ataxia.   Skin: Skin is warm and dry.  Psychiatric:  Dysphoric mood.   Nursing note and vitals reviewed.   ED Course  Procedures (including critical care time) Labs Review Labs Reviewed  COMPREHENSIVE METABOLIC PANEL - Abnormal;  Notable for the following:    Total Protein 8.7 (*)    ALT 69 (*)    All other components within normal limits  ETHANOL - Abnormal; Notable for the following:    Alcohol, Ethyl (B) 204 (*)    All other components within normal limits  ACETAMINOPHEN LEVEL - Abnormal; Notable for the following:    Acetaminophen (Tylenol), Serum <10 (*)    All other components within normal limits  CBC - Abnormal; Notable for the following:    Platelets 446 (*)    All other components within normal limits  URINE RAPID DRUG SCREEN, HOSP PERFORMED - Abnormal; Notable for the following:    Tetrahydrocannabinol POSITIVE (*)    All other components within normal limits  SALICYLATE LEVEL    Imaging Review No results found. I have personally reviewed and evaluated these images and lab results as part of my medical decision-making.   EKG Interpretation None      MDM   Final diagnoses:  Alcohol abuse  Suicidal ideation    2:20 AM Patient will be evaluated by TTS. Patient is medically cleared.     [address] [place], PA-C 05/05/15 0417  Julianne Rice, MD 05/06/15 [phone]

## 2015-05-05 NOTE — Progress Notes (Signed)
[facility] INPATIENT:  Family/Significant Other Suicide Prevention Education  Suicide Prevention Education:  Patient Refusal for Family/Significant Other Suicide Prevention Education: The patient [name] has refused to provide written consent for family/significant other to be provided Family/Significant Other Suicide Prevention Education during admission and/or prior to discharge.    [name] 05/05/2015, 6:27 PM

## 2015-05-06 ENCOUNTER — Encounter (HOSPITAL_COMMUNITY): Payer: Self-pay | Admitting: Registered Nurse

## 2015-05-06 DIAGNOSIS — F1024 Alcohol dependence with alcohol-induced mood disorder: Secondary | ICD-10-CM | POA: Diagnosis not present

## 2015-05-06 DIAGNOSIS — F1094 Alcohol use, unspecified with alcohol-induced mood disorder: Secondary | ICD-10-CM

## 2015-05-06 MED ORDER — LORAZEPAM 1 MG PO TABS
1.0000 mg | ORAL_TABLET | Freq: Four times a day (QID) | ORAL | Status: DC
  Administered 2015-05-06: 1 mg via ORAL
  Filled 2015-05-06: qty 1

## 2015-05-06 MED ORDER — LORAZEPAM 1 MG PO TABS
1.0000 mg | ORAL_TABLET | Freq: Two times a day (BID) | ORAL | Status: DC
Start: 2015-05-08 — End: 2015-05-06

## 2015-05-06 MED ORDER — LORAZEPAM 1 MG PO TABS: 1.0000 mg | ORAL_TABLET | Freq: Every day | ORAL | Status: DC

## 2015-05-06 MED ORDER — ONDANSETRON 4 MG PO TBDP: 4.0000 mg | ORAL_TABLET | Freq: Four times a day (QID) | ORAL | Status: DC | PRN

## 2015-05-06 MED ORDER — HYDROXYZINE HCL 25 MG PO TABS: 25.0000 mg | ORAL_TABLET | Freq: Four times a day (QID) | ORAL | Status: DC | PRN

## 2015-05-06 MED ORDER — LORAZEPAM 1 MG PO TABS: 1.0000 mg | ORAL_TABLET | Freq: Three times a day (TID) | ORAL | Status: DC

## 2015-05-06 MED ORDER — LOPERAMIDE HCL 2 MG PO CAPS: 2.0000 mg | ORAL_CAPSULE | ORAL | Status: DC | PRN

## 2015-05-06 MED ORDER — ADULT MULTIVITAMIN W/MINERALS CH
1.0000 | ORAL_TABLET | Freq: Every day | ORAL | Status: DC
Start: 2015-05-06 — End: 2015-05-06
  Administered 2015-05-06: 1 via ORAL
  Filled 2015-05-06: qty 1

## 2015-05-06 NOTE — Progress Notes (Signed)
D: Patient complained of sweating, shakiness and stomach upset. Denies pain, SI, HI, AH/VH at this time. Endorses mild depression and anxiety. A: Emotional support offered to patient. Patient encouraged to continue with the treatment plan and verbalize any needs to staff. Every 15 minutes check for safety maintained. Will continue to monitor patient for safety and stability. R: Patient is safe.

## 2015-05-06 NOTE — H&P (Signed)
Psychiatric Admission Assessment Adult  Patient Identification: [name] MRN:  [id number] Date of Evaluation:  05/06/2015 Chief Complaint:  ETOH USE DISORDER Principal Diagnosis: Alcohol-induced mood disorder Diagnosis:   Patient Active Problem List   Diagnosis Date Noted  . Alcohol-induced mood disorder [F10.94] 05/05/2015  . MDD (major depressive disorder), recurrent, severe, with psychosis [F33.3] 04/09/2015  . Suicidal ideation [R45.851] 04/09/2015  . Suicide ideation [R45.851]   . Major depressive disorder, recurrent episode, mild [F33.0]   . Alcohol dependence with alcohol-induced mood disorder [F10.24]   . Alcohol use disorder, severe, dependence [F10.20] 11/29/2014  . Current smoker [Z72.0] 11/20/2014  . Tremors [G25.2] 11/03/2014  . Noncompliance with medication regimen [Z91.14]   . Post traumatic stress disorder (PTSD) [F43.10] 11/03/2011   History of Present Illness:: Patient states that he was feeling "a little sick and suicidal cause of alcohol withdrawal; it was just thoughts; I don't feel like that anymore.  I feel 100 % better.  I was just feeling lonely and started beng drinking."  Patient states that he lives alone but has family in [place] and expecting his mother to move in with in the next couple of weeks.  Patient denies suicidal/homicidal ideation, psychosis, and paranoia.  Patient has outpatient services with [facility] and next scheduled appointment is 06/01/2015.  Patient states that he is compliant with his medications.    Elements:  Location:  Alcohol intoxication. Quality:  alcohol induced mood disorder. Severity:  mild. Duration:  1 day. Associated Signs/Symptoms: Depression Symptoms:  depressed mood, difficulty concentrating, anxiety, (Hypo) Manic Symptoms:  Impulsivity, Anxiety Symptoms:  Excessive Worry, Psychotic Symptoms:  auditory hallucinations but stable with medications at this time PTSD Symptoms: Denies Total Time spent with patient:  1 hour  Past Medical History:  Past Medical History  Diagnosis Date  . Psychosis   . PTSD (post-traumatic stress disorder)   . Seizure disorder     related to etoh seizure  . Alcohol abuse   . Schizoaffective disorder   . Anxiety     at age 48  . Depression     at age 76    Past Surgical History  Procedure Laterality Date  . Foot surgery      right  . Hand surgery     Family History:  Family History  Problem Relation Age of Onset  . Alcoholism Mother   . CAD Father   . Depression Brother    Social History:  History  Alcohol Use No    Comment: "I'm a binge drinker.[name] 4-40 ounces/day"; last alcohol use 2 weeks ago (02/16/15)     History  Drug Use  . 1.00 per week  . Special: Marijuana    Comment: THC 2 to 3 times per month; last marijuana use one month ago (02/16/15)    Social History   Social History  . Marital Status: Divorced    Spouse Name: N/A  . Number of Children: N/A  . Years of Education: N/A   Social History Main Topics  . Smoking status: Current Every Day Smoker -- 1.00 packs/day for 24 years    Types: Cigarettes  . Smokeless tobacco: Never Used  . Alcohol Use: No     Comment: "I'm a binge drinker.[name] 4-40 ounces/day"; last alcohol use 2 weeks ago (02/16/15)  . Drug Use: 1.00 per week    Special: Marijuana     Comment: THC 2 to 3 times per month; last marijuana use one month ago (02/16/15)  . Sexual Activity: Yes  Birth Control/ Protection: Condom   Other Topics Concern  . None   Social History Narrative   Additional Social History:    Pain Medications: Pt denies abuse.  Prescriptions: Pt denies abuse.  Over the Counter: Pt denies abuse.  History of alcohol / drug use?: Yes Longest period of sobriety (when/how long): 3.5 years Negative Consequences of Use: Legal, Personal relationships Withdrawal Symptoms: Tremors Date of most recent seizure: Several years ago Name of Substance 1: Alcohol 1 - Age of First Use: 14 1 - Amount (size/oz): Case  24 1 - Frequency: Daily 1 - Duration: ongoing 1 - Last Use / Amount: 05-04-15 Name of Substance 2: THC 2 - Age of First Use: 15 2 - Amount (size/oz): "a couple joints" 2 - Frequency: Ocasionally 2 - Duration: ongoing 2 - Last Use / Amount: 04-28-15                 Musculoskeletal: Strength & Muscle Tone: within normal limits Gait & Station: normal Patient leans: N/A  Psychiatric Specialty Exam: Physical Exam  Constitutional: He is oriented to person, place, and time.  Neck: Normal range of motion.  Respiratory: Effort normal.  Musculoskeletal: Normal range of motion.  Neurological: He is alert and oriented to person, place, and time.  Psychiatric: He has a normal mood and affect. His speech is normal and behavior is normal. Thought content normal. Cognition and memory are normal. He expresses impulsivity.    Review of Systems  Neurological: Negative for tremors.  Psychiatric/Behavioral: Positive for substance abuse. Depression: Denies. Suicidal ideas: Denies. Hallucinations: Denies. Memory loss: Denies. Nervous/anxious: Denies. Insomnia: Denies.   All other systems reviewed and are negative.   Blood pressure 131/91, pulse 78, temperature 98.2 F (36.8 C), temperature source Oral, resp. rate 18, height _0  (1.803 m), weight 90.719 kg (200 lb), SpO2 99 %.Body mass index is 27.91 kg/(m^2).  General Appearance: Disheveled  Eye Contact::  Good  Speech:  Clear and Coherent and Normal Rate  Volume:  Normal  Mood:  "I feel good"  Affect:  Appropriate  Thought Process:  Circumstantial, Coherent and Goal Directed  Orientation:  Full (Time, Place, and Person)  Thought Content:  WDL  Suicidal Thoughts:  No  Homicidal Thoughts:  No  Memory:  Immediate;   Good Recent;   Good Remote;   Good  Judgement:  Fair  Insight:  Present  Psychomotor Activity:  Normal  Concentration:  Fair  Recall:  Good  Fund of Knowledge:Good  Language: Good  Akathisia:  No  Handed:  Right   AIMS (if indicated):     Assets:  Communication Skills Desire for Improvement Housing Physical Health Social Support  ADL's:  Intact  Cognition: WNL  Sleep:      Risk to Self: Is patient at risk for suicide?: Yes Risk to Others:   Prior Inpatient Therapy:   Prior Outpatient Therapy:    Alcohol Screening: 1. How often do you have a drink containing alcohol?: 2 to 3 times a week 2. How many drinks containing alcohol do you have on a typical day when you are drinking?: 10 or more 3. How often do you have six or more drinks on one occasion?: Monthly Preliminary Score: 6 4. How often during the last year have you found that you were not able to stop drinking once you had started?: Monthly 5. How often during the last year have you failed to do what was normally expected from you becasue of drinking?: Monthly 6.  How often during the last year have you needed a first drink in the morning to get yourself going after a heavy drinking session?: Monthly 7. How often during the last year have you had a feeling of guilt of remorse after drinking?: Monthly 8. How often during the last year have you been unable to remember what happened the night before because you had been drinking?: Monthly 9. Have you or someone else been injured as a result of your drinking?: Yes, but not in the last year 10. Has a relative or friend or a doctor or another health worker been concerned about your drinking or suggested you cut down?: Yes, during the last year Alcohol Use Disorder Identification Test Final Score (AUDIT): 25 Brief Intervention: Yes  Allergies:   Allergies  Allergen Reactions  . Wellbutrin [Bupropion] Other (See Comments)    Makes me feel like I'm have a seizure   Lab Results:  Results for orders placed or performed during the hospital encounter of 05/05/15 (from the past 48 hour(s))  Comprehensive metabolic panel     Status: Abnormal   Collection Time: 05/05/15  1:47 AM  Result Value Ref  Range   Sodium 140 135 - 145 mmol/L   Potassium 4.0 3.5 - 5.1 mmol/L   Chloride 103 101 - 111 mmol/L   CO2 23 22 - 32 mmol/L   Glucose, Bld 89 65 - 99 mg/dL   BUN 8 6 - 20 mg/dL   Creatinine, Ser 0.74 0.61 - 1.24 mg/dL   Calcium 9.5 8.9 - 10.3 mg/dL   Total Protein 8.7 (H) 6.5 - 8.1 g/dL   Albumin 5.0 3.5 - 5.0 g/dL   AST 41 15 - 41 U/L   ALT 69 (H) 17 - 63 U/L   Alkaline Phosphatase 92 38 - 126 U/L   Total Bilirubin 0.5 0.3 - 1.2 mg/dL   GFR calc non Af Amer >60 >60 mL/min   GFR calc Af Amer >60 >60 mL/min    Comment: (NOTE) The eGFR has been calculated using the CKD EPI equation. This calculation has not been validated in all clinical situations. eGFR's persistently <60 mL/min signify possible Chronic Kidney Disease.    Anion gap 14 5 - 15  Ethanol (ETOH)     Status: Abnormal   Collection Time: 05/05/15  1:47 AM  Result Value Ref Range   Alcohol, Ethyl (B) 204 (H) <5 mg/dL    Comment:        LOWEST DETECTABLE LIMIT FOR SERUM ALCOHOL IS 5 mg/dL FOR MEDICAL PURPOSES ONLY   Salicylate level     Status: None   Collection Time: 05/05/15  1:47 AM  Result Value Ref Range   Salicylate Lvl <2.1 2.8 - 30.0 mg/dL  Acetaminophen level     Status: Abnormal   Collection Time: 05/05/15  1:47 AM  Result Value Ref Range   Acetaminophen (Tylenol), Serum <10 (L) 10 - 30 ug/mL    Comment:        THERAPEUTIC CONCENTRATIONS VARY SIGNIFICANTLY. A RANGE OF 10-30 ug/mL MAY BE AN EFFECTIVE CONCENTRATION FOR MANY PATIENTS. HOWEVER, SOME ARE BEST TREATED AT CONCENTRATIONS OUTSIDE THIS RANGE. ACETAMINOPHEN CONCENTRATIONS >150 ug/mL AT 4 HOURS AFTER INGESTION AND >50 ug/mL AT 12 HOURS AFTER INGESTION ARE OFTEN ASSOCIATED WITH TOXIC REACTIONS.   CBC     Status: Abnormal   Collection Time: 05/05/15  1:47 AM  Result Value Ref Range   WBC 7.3 4.0 - 10.5 K/uL   RBC 5.14 4.22 - 5.81  MIL/uL   Hemoglobin 16.7 13.0 - 17.0 g/dL   HCT 48.2 39.0 - 52.0 %   MCV 93.8 78.0 - 100.0 fL   MCH 32.5  26.0 - 34.0 pg   MCHC 34.6 30.0 - 36.0 g/dL   RDW 14.5 11.5 - 15.5 %   Platelets 446 (H) 150 - 400 K/uL  Urine rapid drug screen (hosp performed) (Not at [facility])     Status: Abnormal   Collection Time: 05/05/15  1:49 AM  Result Value Ref Range   Opiates NONE DETECTED NONE DETECTED   Cocaine NONE DETECTED NONE DETECTED   Benzodiazepines NONE DETECTED NONE DETECTED   Amphetamines NONE DETECTED NONE DETECTED   Tetrahydrocannabinol POSITIVE (A) NONE DETECTED   Barbiturates NONE DETECTED NONE DETECTED    Comment:        DRUG SCREEN FOR MEDICAL PURPOSES ONLY.  IF CONFIRMATION IS NEEDED FOR ANY PURPOSE, NOTIFY LAB WITHIN 5 DAYS.        LOWEST DETECTABLE LIMITS FOR URINE DRUG SCREEN Drug Class       Cutoff (ng/mL) Amphetamine      1000 Barbiturate      200 Benzodiazepine   332 Tricyclics       951 Opiates          300 Cocaine          300 THC              50    Current Medications: Current Facility-Administered Medications  Medication Dose Route Frequency Provider Last Rate Last Dose  . acamprosate (CAMPRAL) tablet 666 mg  666 mg Oral TID WC Patrecia Pour, NP   666 mg at 05/06/15 1133  . acetaminophen (TYLENOL) tablet 650 mg  650 mg Oral Q6H PRN Patrecia Pour, NP      . allopurinol (ZYLOPRIM) tablet 100 mg  100 mg Oral Daily Delfin Gant, NP   100 mg at 05/06/15 0749  . alum & mag hydroxide-simeth (MAALOX/MYLANTA) 200-200-20 MG/5ML suspension 30 mL  30 mL Oral PRN Patrecia Pour, NP      . alum & mag hydroxide-simeth (MAALOX/MYLANTA) 200-200-20 MG/5ML suspension 30 mL  30 mL Oral Q4H PRN Patrecia Pour, NP      . famotidine (PEPCID) tablet 20 mg  20 mg Oral BID PRN Patrecia Pour, NP      . gabapentin (NEURONTIN) capsule 400 mg  400 mg Oral QID Patrecia Pour, NP   400 mg at 05/06/15 1134  . hydrOXYzine (ATARAX/VISTARIL) tablet 25 mg  25 mg Oral Q6H PRN Hampton Abbot, MD      . loperamide (IMODIUM) capsule 2-4 mg  2-4 mg Oral PRN Hampton Abbot, MD      . LORazepam (ATIVAN)  tablet 1 mg  1 mg Oral QID Hampton Abbot, MD   1 mg at 05/06/15 1133   Followed by  . [START ON 05/07/2015] LORazepam (ATIVAN) tablet 1 mg  1 mg Oral TID Hampton Abbot, MD       Followed by  . [START ON 05/08/2015] LORazepam (ATIVAN) tablet 1 mg  1 mg Oral BID Hampton Abbot, MD       Followed by  . [START ON 05/09/2015] LORazepam (ATIVAN) tablet 1 mg  1 mg Oral Daily Hampton Abbot, MD      . magnesium hydroxide (MILK OF MAGNESIA) suspension 30 mL  30 mL Oral Daily PRN Patrecia Pour, NP      . multivitamin with minerals tablet 1 tablet  1 tablet Oral Daily Hampton Abbot, MD   1 tablet at 05/06/15 1009  . nicotine (NICODERM CQ - dosed in mg/24 hours) patch 21 mg  21 mg Transdermal Daily Patrecia Pour, NP   21 mg at 05/06/15 1135  . ondansetron (ZOFRAN-ODT) disintegrating tablet 4 mg  4 mg Oral Q6H PRN Hampton Abbot, MD      . sertraline (ZOLOFT) tablet 75 mg  75 mg Oral Daily Delfin Gant, NP   75 mg at 05/06/15 0745  . thiamine (VITAMIN B-1) tablet 100 mg  100 mg Oral Daily Patrecia Pour, NP   100 mg at 05/06/15 0747  . ziprasidone (GEODON) capsule 60 mg  60 mg Oral BID WC Patrecia Pour, NP   60 mg at 05/06/15 0746   PTA Medications: Prescriptions prior to admission  Medication Sig Dispense Refill Last Dose  . acamprosate (CAMPRAL) 333 MG tablet Take 2 tablets (666 mg total) by mouth 3 (three) times daily with meals. 180 tablet 0 05/04/2015 at Unknown time  . allopurinol (ZYLOPRIM) 100 MG tablet Take 1 tablet (100 mg total) by mouth daily. 30 tablet 3 05/04/2015 at Unknown time  . famotidine (PEPCID) 20 MG tablet Take 1 tablet (20 mg total) by mouth 2 (two) times daily. (Patient taking differently: Take 20 mg by mouth 2 (two) times daily as needed (for acid reflux). ) 30 tablet 0 Past Week at Unknown time  . gabapentin (NEURONTIN) 400 MG capsule Take 1 capsule (400 mg total) by mouth 4 (four) times daily. 120 capsule 0 05/04/2015 at Unknown time  . sertraline (ZOLOFT) 25 MG tablet Take 3  tablets (75 mg total) by mouth daily. 90 tablet 0 05/04/2015 at Unknown time  . ziprasidone (GEODON) 60 MG capsule Take 1 capsule (60 mg total) by mouth 2 (two) times daily with a meal. 60 capsule 0 05/04/2015 at Unknown time    Previous Psychotropic Medications: Yes   Substance Abuse History in the last 12 months:  Yes.      Consequences of Substance Abuse: Withdrawal Symptoms:   Cramps Diarrhea Headaches Tremors  Results for orders placed or performed during the hospital encounter of 05/05/15 (from the past 72 hour(s))  Comprehensive metabolic panel     Status: Abnormal   Collection Time: 05/05/15  1:47 AM  Result Value Ref Range   Sodium 140 135 - 145 mmol/L   Potassium 4.0 3.5 - 5.1 mmol/L   Chloride 103 101 - 111 mmol/L   CO2 23 22 - 32 mmol/L   Glucose, Bld 89 65 - 99 mg/dL   BUN 8 6 - 20 mg/dL   Creatinine, Ser 0.74 0.61 - 1.24 mg/dL   Calcium 9.5 8.9 - 10.3 mg/dL   Total Protein 8.7 (H) 6.5 - 8.1 g/dL   Albumin 5.0 3.5 - 5.0 g/dL   AST 41 15 - 41 U/L   ALT 69 (H) 17 - 63 U/L   Alkaline Phosphatase 92 38 - 126 U/L   Total Bilirubin 0.5 0.3 - 1.2 mg/dL   GFR calc non Af Amer >60 >60 mL/min   GFR calc Af Amer >60 >60 mL/min    Comment: (NOTE) The eGFR has been calculated using the CKD EPI equation. This calculation has not been validated in all clinical situations. eGFR's persistently <60 mL/min signify possible Chronic Kidney Disease.    Anion gap 14 5 - 15  Ethanol (ETOH)     Status: Abnormal   Collection Time: 05/05/15  1:47 AM  Result Value Ref Range   Alcohol, Ethyl (B) 204 (H) <5 mg/dL    Comment:        LOWEST DETECTABLE LIMIT FOR SERUM ALCOHOL IS 5 mg/dL FOR MEDICAL PURPOSES ONLY   Salicylate level     Status: None   Collection Time: 05/05/15  1:47 AM  Result Value Ref Range   Salicylate Lvl <8.0 2.8 - 30.0 mg/dL  Acetaminophen level     Status: Abnormal   Collection Time: 05/05/15  1:47 AM  Result Value Ref Range   Acetaminophen (Tylenol), Serum  <10 (L) 10 - 30 ug/mL    Comment:        THERAPEUTIC CONCENTRATIONS VARY SIGNIFICANTLY. A RANGE OF 10-30 ug/mL MAY BE AN EFFECTIVE CONCENTRATION FOR MANY PATIENTS. HOWEVER, SOME ARE BEST TREATED AT CONCENTRATIONS OUTSIDE THIS RANGE. ACETAMINOPHEN CONCENTRATIONS >150 ug/mL AT 4 HOURS AFTER INGESTION AND >50 ug/mL AT 12 HOURS AFTER INGESTION ARE OFTEN ASSOCIATED WITH TOXIC REACTIONS.   CBC     Status: Abnormal   Collection Time: 05/05/15  1:47 AM  Result Value Ref Range   WBC 7.3 4.0 - 10.5 K/uL   RBC 5.14 4.22 - 5.81 MIL/uL   Hemoglobin 16.7 13.0 - 17.0 g/dL   HCT 48.2 39.0 - 52.0 %   MCV 93.8 78.0 - 100.0 fL   MCH 32.5 26.0 - 34.0 pg   MCHC 34.6 30.0 - 36.0 g/dL   RDW 14.5 11.5 - 15.5 %   Platelets 446 (H) 150 - 400 K/uL  Urine rapid drug screen (hosp performed) (Not at [facility])     Status: Abnormal   Collection Time: 05/05/15  1:49 AM  Result Value Ref Range   Opiates NONE DETECTED NONE DETECTED   Cocaine NONE DETECTED NONE DETECTED   Benzodiazepines NONE DETECTED NONE DETECTED   Amphetamines NONE DETECTED NONE DETECTED   Tetrahydrocannabinol POSITIVE (A) NONE DETECTED   Barbiturates NONE DETECTED NONE DETECTED    Comment:        DRUG SCREEN FOR MEDICAL PURPOSES ONLY.  IF CONFIRMATION IS NEEDED FOR ANY PURPOSE, NOTIFY LAB WITHIN 5 DAYS.        LOWEST DETECTABLE LIMITS FOR URINE DRUG SCREEN Drug Class       Cutoff (ng/mL) Amphetamine      1000 Barbiturate      200 Benzodiazepine   998 Tricyclics       338 Opiates          300 Cocaine          300 THC              50     Observation Level/Precautions:  15 minute checks  Laboratory:  CBC Chemistry Profile UDS  Psychotherapy:  Individual and group sessions  Medications:  Medications started as appropriate for stabilization  Consultations:  Psychiatry  Discharge Concerns:  Safety, stabilization, and risk of access to medication and medication stabilization   Estimated LOS:  24 hour observation or until stable   Other:     Psychological Evaluations: Yes   Treatment Plan Summary: Daily contact with patient to assess and evaluate symptoms and progress in treatment and Medication management   Disposition:   Discharge home.  Patient to keep follow up appointment with [facility] for 06/01/2015.  TTS to give patient other resource information on AA/Substance abuse in [place] area.    Medical Decision Making:  Review of Psycho-Social Stressors (1), Review or order clinical lab tests (1), Review and summation of old records (2), Review  of Last Therapy Session (1), Independent Review of image, tracing or specimen (2) and Review of Medication Regimen & Side Effects (2)  I certify that inpatient services furnished can reasonably be expected to improve the patient's condition.   Earleen Newport, FNP-BC 9/18/201612:33 PM I agree with plan

## 2015-05-06 NOTE — Progress Notes (Signed)
Pt alert and cooperative. Discharge instructions reviewed with patient. All belongings returned. Pt discharged home via bus (bus pass given).

## 2015-05-06 NOTE — [facility] Counselor (Signed)
[facility] Assessment Progress Note  Per Earleen Newport, NP & Larose Kells, NP pt can be d/c home with recommendation to f/u with his upcoming scheduled [facility] appt. Pt is agreeable to this plan and will be given a bus pass to d/c.   Kenna Gilbert. Lovena Le, [place], [place], [facility] Counselor

## 2015-05-06 NOTE — Discharge Summary (Signed)
Physician Discharge Summary Note  Patient:  [name] is an 44 y.o., male MRN:  [id number] DOB:  September 20, 1970 Patient phone:  [phone] (home)  Patient address:   [address] [place] [address],  Total Time spent with patient: 45 minutes  Date of Admission:  05/05/2015 Date of Discharge: 05/06/2015  Reason for Admission:  Per OBS H&P Note:  Patient states that he was feeling "a little sick and suicidal cause of alcohol withdrawal; it was just thoughts; I don't feel like that anymore. I feel 100 % better. I was just feeling lonely and started beng drinking." Patient states that he lives alone but has family in [place] and expecting his mother to move in with in the next couple of weeks. Patient denies suicidal/homicidal ideation, psychosis, and paranoia. Patient has outpatient services with [facility] and next scheduled appointment is 06/01/2015. Patient states that he is compliant with his medications.    Principal Problem: Alcohol-induced mood disorder Discharge Diagnoses: Patient Active Problem List   Diagnosis Date Noted  . Alcohol-induced mood disorder [F10.94] 05/05/2015  . MDD (major depressive disorder), recurrent, severe, with psychosis [F33.3] 04/09/2015  . Suicidal ideation [R45.851] 04/09/2015  . Suicide ideation [R45.851]   . Major depressive disorder, recurrent episode, mild [F33.0]   . Alcohol dependence with alcohol-induced mood disorder [F10.24]   . Alcohol use disorder, severe, dependence [F10.20] 11/29/2014  . Current smoker [Z72.0] 11/20/2014  . Tremors [G25.2] 11/03/2014  . Noncompliance with medication regimen [Z91.14]   . Post traumatic stress disorder (PTSD) [F43.10] 11/03/2011    Musculoskeletal: Strength & Muscle Tone: within normal limits Gait & Station: normal Patient leans: N/A  Psychiatric Specialty Exam:  See OBS H&P Note Physical Exam  Nursing note and vitals reviewed.   ROS  Blood pressure 131/91, pulse 78, temperature  98.2 F (36.8 C), temperature source Oral, resp. rate 18, height _0  (1.803 m), weight 90.719 kg (200 lb), SpO2 99 %.Body mass index is 27.91 kg/(m^2).  Have you used any form of tobacco in the last 30 days? (Cigarettes, Smokeless Tobacco, Cigars, and/or Pipes): Yes  Has this patient used any form of tobacco in the last 30 days? (Cigarettes, Smokeless Tobacco, Cigars, and/or Pipes) Yes, A prescription for an FDA-approved tobacco cessation medication was offered at discharge and the patient refused  Past Medical History:  Past Medical History  Diagnosis Date  . Psychosis   . PTSD (post-traumatic stress disorder)   . Seizure disorder     related to etoh seizure  . Alcohol abuse   . Schizoaffective disorder   . Anxiety     at age 61  . Depression     at age 89    Past Surgical History  Procedure Laterality Date  . Foot surgery      right  . Hand surgery     Family History:  Family History  Problem Relation Age of Onset  . Alcoholism Mother   . CAD Father   . Depression Brother    Social History:  History  Alcohol Use No    Comment: "I'm a binge drinker.[name] 4-40 ounces/day"; last alcohol use 2 weeks ago (02/16/15)     History  Drug Use  . 1.00 per week  . Special: Marijuana    Comment: THC 2 to 3 times per month; last marijuana use one month ago (02/16/15)    Social History   Social History  . Marital Status: Divorced    Spouse Name: N/A  . Number of Children:  N/A  . Years of Education: N/A   Social History Main Topics  . Smoking status: Current Every Day Smoker -- 1.00 packs/day for 24 years    Types: Cigarettes  . Smokeless tobacco: Never Used  . Alcohol Use: No     Comment: "I'm a binge drinker.[name] 4-40 ounces/day"; last alcohol use 2 weeks ago (02/16/15)  . Drug Use: 1.00 per week    Special: Marijuana     Comment: THC 2 to 3 times per month; last marijuana use one month ago (02/16/15)  . Sexual Activity: Yes    Birth Control/ Protection: Condom   Other Topics  Concern  . None   Social History Narrative   Risk to Self: Is patient at risk for suicide?: Yes Risk to Others:   Prior Inpatient Therapy:   Prior Outpatient Therapy:    Level of Care:  OP  Hospital Course:  [name] was admitted for 24 hour observation for Alcohol-induced mood disorder and crisis management.  He was treated discharged with the medications listed below under Medication List.  Medical problems were identified and treated as needed.  Home medications were restarted as appropriate.  Improvement was monitored by observation symptom reduction.  Emotional and mental status were also monitored by clinical staff.         [name] was evaluated by the treatment team for stability and plans for continued recovery upon discharge.  [name] motivation was an integral factor for scheduling further treatment.  Employment, transportation, bed availability, health status, family support, and any pending legal issues were also considered during his hospital stay.  He was offered further treatment options upon discharge including but not limited to Residential, Intensive Outpatient, and Outpatient treatment.  [name] will follow up with the services as listed below under Follow Up Information.     Upon completion of this admission the patient was both mentally and medically stable for discharge denying suicidal/homicidal ideation, auditory/visual/tactile hallucinations, delusional thoughts and paranoia.      Consults:  psychiatry  Significant Diagnostic Studies:  labs: Reviewed  Discharge Vitals:   Blood pressure 131/91, pulse 78, temperature 98.2 F (36.8 C), temperature source Oral, resp. rate 18, height _0  (1.803 m), weight 90.719 kg (200 lb), SpO2 99 %. Body mass index is 27.91 kg/(m^2). Lab Results:   Results for orders placed or performed during the hospital encounter of 05/05/15 (from the past 72 hour(s))  Comprehensive metabolic panel     Status:  Abnormal   Collection Time: 05/05/15  1:47 AM  Result Value Ref Range   Sodium 140 135 - 145 mmol/L   Potassium 4.0 3.5 - 5.1 mmol/L   Chloride 103 101 - 111 mmol/L   CO2 23 22 - 32 mmol/L   Glucose, Bld 89 65 - 99 mg/dL   BUN 8 6 - 20 mg/dL   Creatinine, Ser 0.74 0.61 - 1.24 mg/dL   Calcium 9.5 8.9 - 10.3 mg/dL   Total Protein 8.7 (H) 6.5 - 8.1 g/dL   Albumin 5.0 3.5 - 5.0 g/dL   AST 41 15 - 41 U/L   ALT 69 (H) 17 - 63 U/L   Alkaline Phosphatase 92 38 - 126 U/L   Total Bilirubin 0.5 0.3 - 1.2 mg/dL   GFR calc non Af Amer >60 >60 mL/min   GFR calc Af Amer >60 >60 mL/min    Comment: (NOTE) The eGFR has been calculated using the CKD EPI equation. This calculation has not been validated in  all clinical situations. eGFR's persistently <60 mL/min signify possible Chronic Kidney Disease.    Anion gap 14 5 - 15  Ethanol (ETOH)     Status: Abnormal   Collection Time: 05/05/15  1:47 AM  Result Value Ref Range   Alcohol, Ethyl (B) 204 (H) <5 mg/dL    Comment:        LOWEST DETECTABLE LIMIT FOR SERUM ALCOHOL IS 5 mg/dL FOR MEDICAL PURPOSES ONLY   Salicylate level     Status: None   Collection Time: 05/05/15  1:47 AM  Result Value Ref Range   Salicylate Lvl <3.8 2.8 - 30.0 mg/dL  Acetaminophen level     Status: Abnormal   Collection Time: 05/05/15  1:47 AM  Result Value Ref Range   Acetaminophen (Tylenol), Serum <10 (L) 10 - 30 ug/mL    Comment:        THERAPEUTIC CONCENTRATIONS VARY SIGNIFICANTLY. A RANGE OF 10-30 ug/mL MAY BE AN EFFECTIVE CONCENTRATION FOR MANY PATIENTS. HOWEVER, SOME ARE BEST TREATED AT CONCENTRATIONS OUTSIDE THIS RANGE. ACETAMINOPHEN CONCENTRATIONS >150 ug/mL AT 4 HOURS AFTER INGESTION AND >50 ug/mL AT 12 HOURS AFTER INGESTION ARE OFTEN ASSOCIATED WITH TOXIC REACTIONS.   CBC     Status: Abnormal   Collection Time: 05/05/15  1:47 AM  Result Value Ref Range   WBC 7.3 4.0 - 10.5 K/uL   RBC 5.14 4.22 - 5.81 MIL/uL   Hemoglobin 16.7 13.0 - 17.0 g/dL    HCT 48.2 39.0 - 52.0 %   MCV 93.8 78.0 - 100.0 fL   MCH 32.5 26.0 - 34.0 pg   MCHC 34.6 30.0 - 36.0 g/dL   RDW 14.5 11.5 - 15.5 %   Platelets 446 (H) 150 - 400 K/uL  Urine rapid drug screen (hosp performed) (Not at [facility])     Status: Abnormal   Collection Time: 05/05/15  1:49 AM  Result Value Ref Range   Opiates NONE DETECTED NONE DETECTED   Cocaine NONE DETECTED NONE DETECTED   Benzodiazepines NONE DETECTED NONE DETECTED   Amphetamines NONE DETECTED NONE DETECTED   Tetrahydrocannabinol POSITIVE (A) NONE DETECTED   Barbiturates NONE DETECTED NONE DETECTED    Comment:        DRUG SCREEN FOR MEDICAL PURPOSES ONLY.  IF CONFIRMATION IS NEEDED FOR ANY PURPOSE, NOTIFY LAB WITHIN 5 DAYS.        LOWEST DETECTABLE LIMITS FOR URINE DRUG SCREEN Drug Class       Cutoff (ng/mL) Amphetamine      1000 Barbiturate      200 Benzodiazepine   182 Tricyclics       993 Opiates          300 Cocaine          300 THC              50     Physical Findings: AIMS: Facial and Oral Movements Muscles of Facial Expression: None, normal Lips and Perioral Area: None, normal Jaw: None, normal Tongue: None, normal,Extremity Movements Upper (arms, wrists, hands, fingers): None, normal Lower (legs, knees, ankles, toes): None, normal, Trunk Movements Neck, shoulders, hips: None, normal, Overall Severity Severity of abnormal movements (highest score from questions above): None, normal Incapacitation due to abnormal movements: None, normal Patient's awareness of abnormal movements (rate only patient's report): No Awareness, Dental Status Current problems with teeth and/or dentures?: No ("missing a couple") Does patient usually wear dentures?: No  CIWA:  CIWA-Ar Total: 5 COWS:      See Psychiatric  Specialty Exam and Suicide Risk Assessment completed by Attending Physician prior to discharge.  Discharge destination:  Home  Is patient on multiple antipsychotic therapies at discharge:  No   Has  Patient had three or more failed trials of antipsychotic monotherapy by history:  No    Recommended Plan for Multiple Antipsychotic Therapies: NA      Discharge Instructions    Activity as tolerated - No restrictions    Complete by:  As directed      Diet general    Complete by:  As directed      Discharge instructions    Complete by:  As directed   Take all of you medications as prescribed by your mental healthcare provider.  Report any adverse effects and reactions from your medications to your outpatient provider promptly. Do not engage in alcohol and or illegal drug use while on prescription medicines. In the event of worsening symptoms call the crisis hotline, 911, and or go to the nearest emergency department for appropriate evaluation and treatment of symptoms. Follow-up with your primary care provider for your medical issues, concerns and or health care needs.   Keep all scheduled appointments.  If you are unable to keep an appointment call to reschedule.  Let the nurse know if you will need medications before next scheduled appointment.            Medication List    TAKE these medications      Indication   acamprosate 333 MG tablet  Commonly known as:  CAMPRAL  Take 2 tablets (666 mg total) by mouth 3 (three) times daily with meals.   Indication:  Excessive Use of Alcohol     allopurinol 100 MG tablet  Commonly known as:  ZYLOPRIM  Take 1 tablet (100 mg total) by mouth daily.   Indication:  gout     famotidine 20 MG tablet  Commonly known as:  PEPCID  Take 1 tablet (20 mg total) by mouth 2 (two) times daily.   Indication:  Gastroesophageal Reflux Disease     gabapentin 400 MG capsule  Commonly known as:  NEURONTIN  Take 1 capsule (400 mg total) by mouth 4 (four) times daily.   Indication:  Neuropathic Pain     sertraline 25 MG tablet  Commonly known as:  ZOLOFT  Take 3 tablets (75 mg total) by mouth daily.   Indication:  Major Depressive Disorder      ziprasidone 60 MG capsule  Commonly known as:  GEODON  Take 1 capsule (60 mg total) by mouth 2 (two) times daily with a meal.   Indication:  mood stabilization       Follow-up Information    Follow up with [facility].   Specialty:  Behavioral Health   Why:  for upcoming scheduled appointment   Contact information:   [address] [place] [address] [phone]       Follow-up recommendations:  Activity:  As tolerated Diet:  As tolerated  Comments:   Patient has been instructed to take medications as prescribed; and report adverse effects to outpatient provider.  Follow up with primary doctor for any medical issues and If symptoms recur report to nearest emergency or crisis hot line.    Total Discharge Time: 45 minutes   Signed: Earleen Newport, FNP-BC 05/06/2015, 12:32 PM   Notes reviewed. Agree with plan

## 2015-05-06 NOTE — BHH Counselor (Signed)
OBS Counselor spoke with pt in regards to his discharge plan. Pt shares that he would like to go into a detox program in regards to his alcoholism. Pt explained that he would like to receive treatment here at [facility]. Pt also explained that he has been here several times before. This writer recommends that pt is evaluated in the morning by psychiatry for a final disposition and that he is accepted here at [facility] in an ongoing effort to assist pt with detox and sobriety.  Pt was receptive to this writer's recommendations. This Probation officer will continue to encourage pt and offer support as well as other resources to him as needed. Pt will also be given some resources for assist him with combating his frequent episodes of depression and past issues that are continuing to cause ongoing stress in his life on a daily basis.  Redmond Pulling, MA OBS Counselor

## 2015-05-06 NOTE — Progress Notes (Signed)
Pt alert and cooperative. Affect/mood anxious and depressed. -SI/HI/A/V/H, verbally contracts for safety. "I feel much better". Emotional support and encouragement given. Will continue to monitor closely and evaluate for stabilization.

## 2015-05-09 IMAGING — CR DG LUMBAR SPINE COMPLETE 4+V
5 series · 5 of 5 positions shown · non-contrast
Comparison: None.

CLINICAL DATA: Fall today with lumbar back pain and tailbone pain.
No radiation of symptoms. Initial encounter.

EXAM:
LUMBAR SPINE - COMPLETE 4+ VIEW

[t lumbar spine ap]
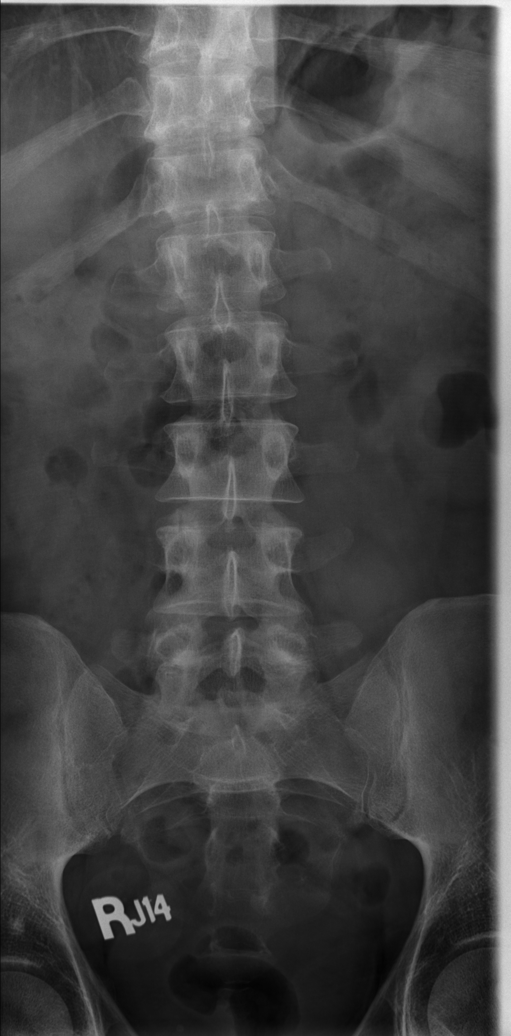

[t lumbar spine obl (1 of 2)]
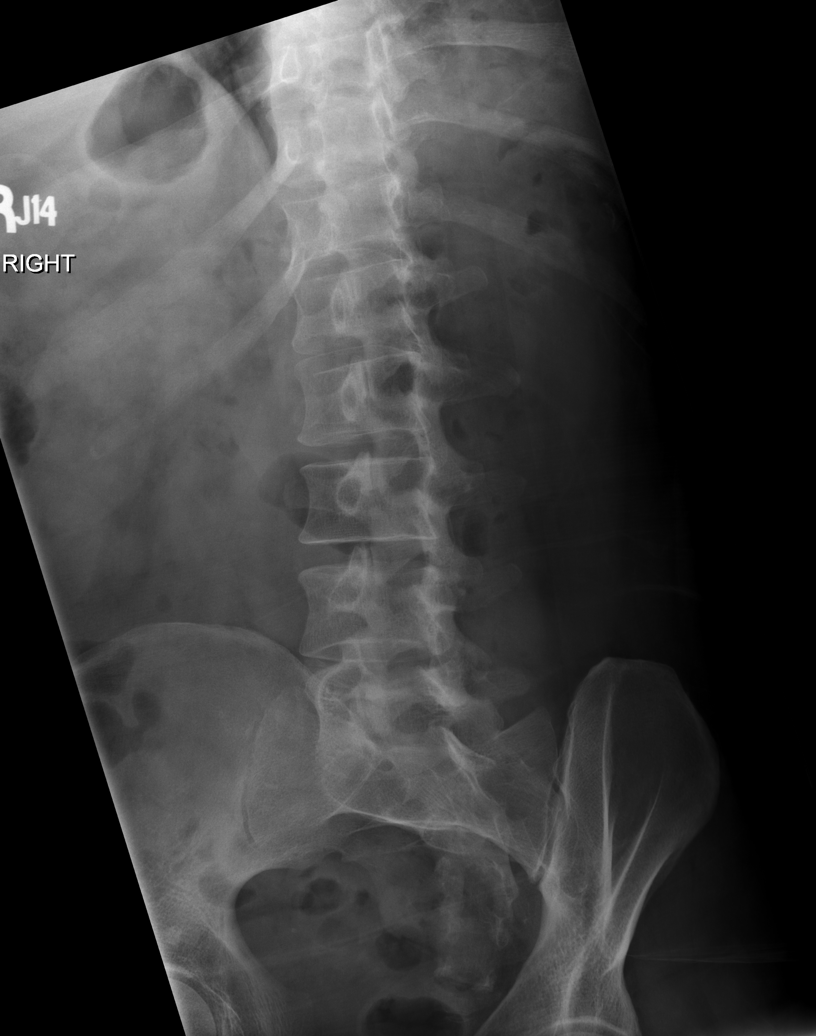

[t lumbar spine obl (2 of 2)]
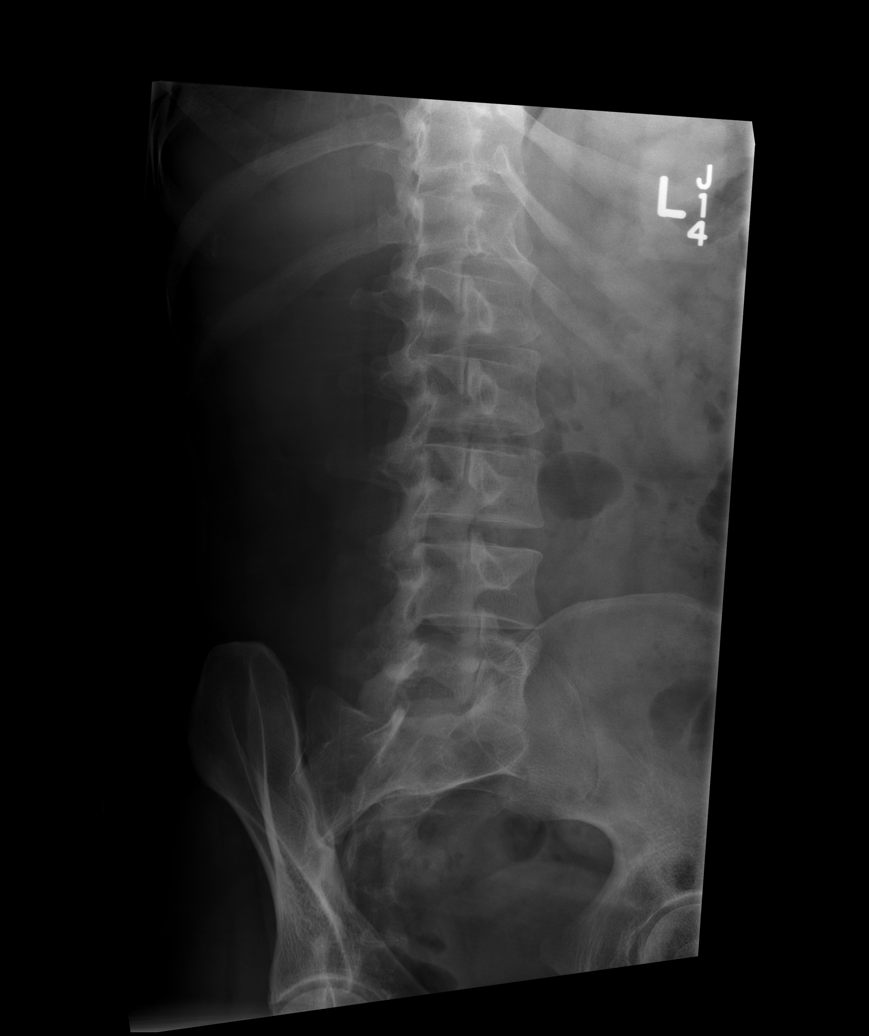

[t lumbar l-5 s-1 spot (1 of 2)]
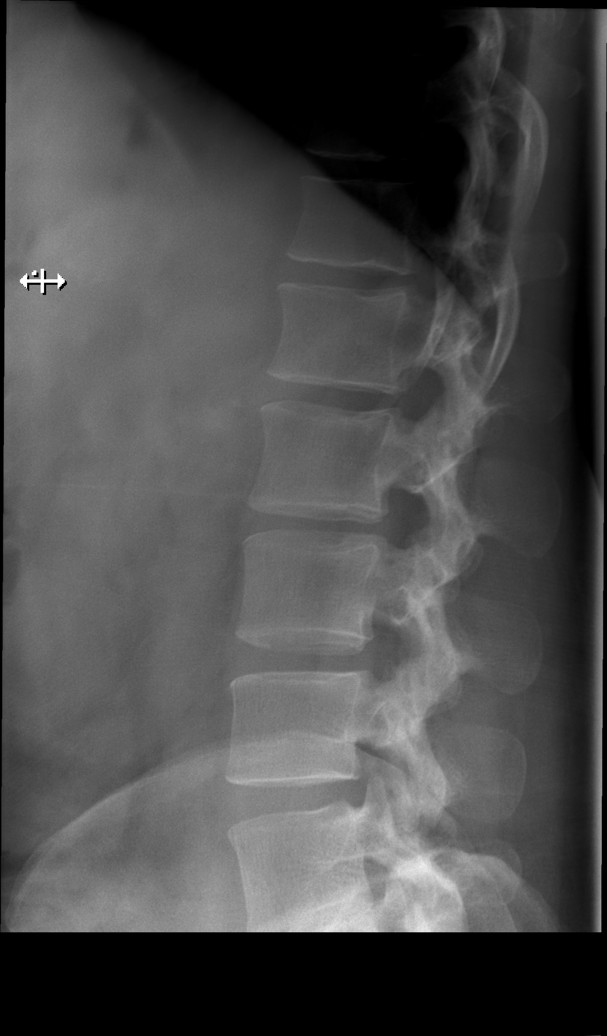

[t lumbar l-5 s-1 spot (2 of 2)]
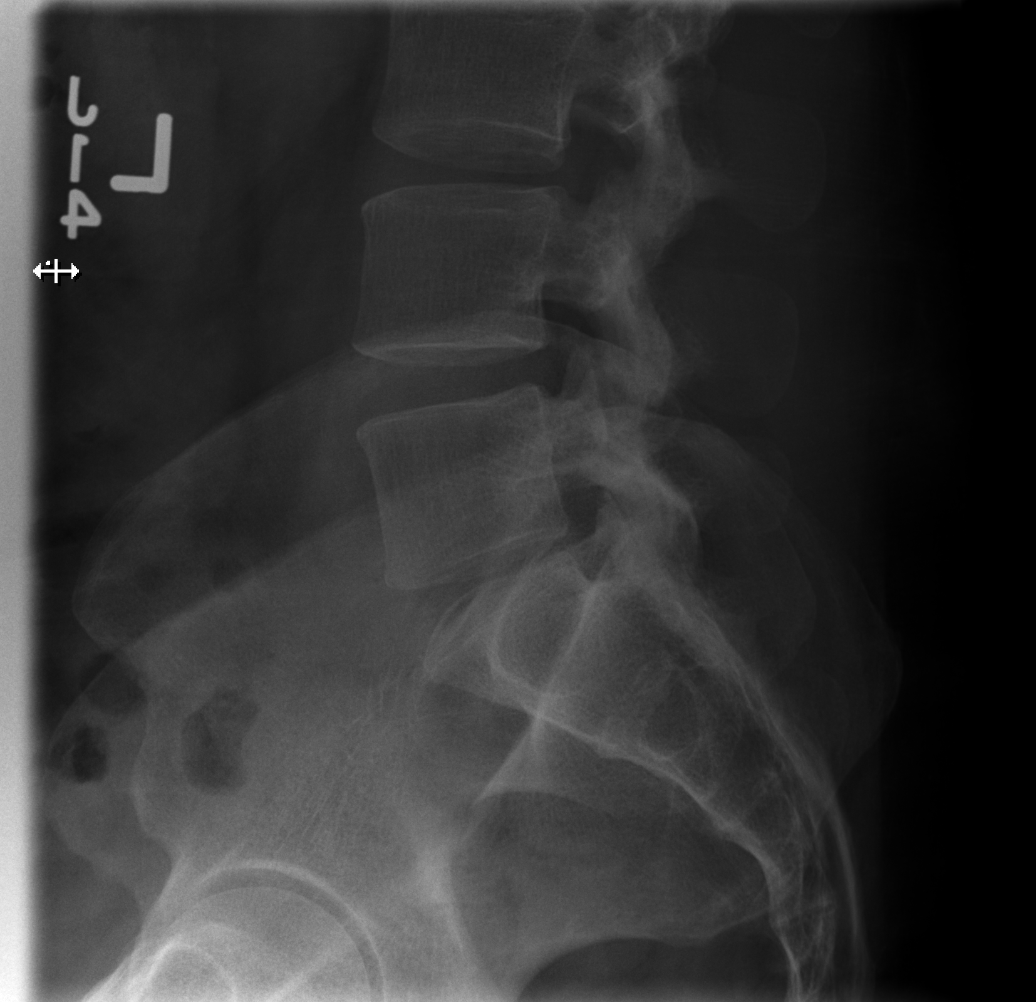

[5 of 5 positions shown; findings below may reference images not displayed]

FINDINGS: Alignment is anatomic. Vertebral body and disc space height are
maintained. No definite pars defects. No endplate degenerative
changes.
IMPRESSION: Normal exam.

## 2015-05-13 ENCOUNTER — Observation Stay (HOSPITAL_COMMUNITY)
Admission: AD | Admit: 2015-05-13 | Discharge: 2015-05-14 | Disposition: A | Discharge status: Home / Self Care | Payer: Medicare Other | Source: Intra-hospital | Attending: Psychiatry | Admitting: Psychiatry

## 2015-05-13 ENCOUNTER — Encounter (HOSPITAL_COMMUNITY): Payer: Self-pay | Admitting: *Deleted

## 2015-05-13 ENCOUNTER — Emergency Department (HOSPITAL_COMMUNITY)
Admission: EM | Admit: 2015-05-13 | Discharge: 2015-05-13 | Disposition: A | Discharge status: Other Acute Inpatient Hospital | Payer: Medicare Other | Attending: Emergency Medicine | Admitting: Emergency Medicine

## 2015-05-13 ENCOUNTER — Encounter (HOSPITAL_COMMUNITY): Payer: Self-pay | Admitting: Oncology

## 2015-05-13 DIAGNOSIS — Z79899 Other long term (current) drug therapy: Secondary | ICD-10-CM | POA: Insufficient documentation

## 2015-05-13 DIAGNOSIS — F1014 Alcohol abuse with alcohol-induced mood disorder: Secondary | ICD-10-CM | POA: Diagnosis not present

## 2015-05-13 DIAGNOSIS — F17210 Nicotine dependence, cigarettes, uncomplicated: Secondary | ICD-10-CM | POA: Insufficient documentation

## 2015-05-13 DIAGNOSIS — R45851 Suicidal ideations: Secondary | ICD-10-CM

## 2015-05-13 DIAGNOSIS — F329 Major depressive disorder, single episode, unspecified: Secondary | ICD-10-CM | POA: Insufficient documentation

## 2015-05-13 DIAGNOSIS — F22 Delusional disorders: Secondary | ICD-10-CM | POA: Diagnosis not present

## 2015-05-13 DIAGNOSIS — F1094 Alcohol use, unspecified with alcohol-induced mood disorder: Secondary | ICD-10-CM

## 2015-05-13 DIAGNOSIS — F4310 Post-traumatic stress disorder, unspecified: Secondary | ICD-10-CM | POA: Insufficient documentation

## 2015-05-13 DIAGNOSIS — F29 Unspecified psychosis not due to a substance or known physiological condition: Secondary | ICD-10-CM

## 2015-05-13 DIAGNOSIS — F259 Schizoaffective disorder, unspecified: Secondary | ICD-10-CM | POA: Insufficient documentation

## 2015-05-13 DIAGNOSIS — F330 Major depressive disorder, recurrent, mild: Secondary | ICD-10-CM | POA: Insufficient documentation

## 2015-05-13 DIAGNOSIS — F1210 Cannabis abuse, uncomplicated: Secondary | ICD-10-CM | POA: Insufficient documentation

## 2015-05-13 DIAGNOSIS — F1024 Alcohol dependence with alcohol-induced mood disorder: Secondary | ICD-10-CM

## 2015-05-13 DIAGNOSIS — Z9114 Patient's other noncompliance with medication regimen: Secondary | ICD-10-CM | POA: Diagnosis not present

## 2015-05-13 DIAGNOSIS — F10129 Alcohol abuse with intoxication, unspecified: Secondary | ICD-10-CM | POA: Diagnosis present

## 2015-05-13 DIAGNOSIS — M109 Gout, unspecified: Secondary | ICD-10-CM

## 2015-05-13 DIAGNOSIS — F1020 Alcohol dependence, uncomplicated: Secondary | ICD-10-CM | POA: Diagnosis present

## 2015-05-13 LAB — RAPID URINE DRUG SCREEN, HOSP PERFORMED
Amphetamines: NOT DETECTED
Barbiturates: NOT DETECTED
Benzodiazepines: NOT DETECTED
Cocaine: NOT DETECTED
Opiates: NOT DETECTED
Tetrahydrocannabinol: POSITIVE — AB

## 2015-05-13 LAB — COMPREHENSIVE METABOLIC PANEL
ALT: 41 U/L (ref 17–63)
AST: 37 U/L (ref 15–41)
Albumin: 5.2 g/dL — ABNORMAL HIGH (ref 3.5–5.0)
Alkaline Phosphatase: 104 U/L (ref 38–126)
Anion gap: 24 — ABNORMAL HIGH (ref 5–15)
BUN: 10 mg/dL (ref 6–20)
CO2: 19 mmol/L — ABNORMAL LOW (ref 22–32)
Calcium: 9.4 mg/dL (ref 8.9–10.3)
Chloride: 93 mmol/L — ABNORMAL LOW (ref 101–111)
Creatinine, Ser: 0.85 mg/dL (ref 0.61–1.24)
GFR calc Af Amer: >60 mL/min (ref >60)
GFR calc non Af Amer: >60 mL/min (ref >60)
Glucose, Bld: 88 mg/dL (ref 65–99)
Potassium: 3.9 mmol/L (ref 3.5–5.1)
Sodium: 136 mmol/L (ref 135–145)
Total Bilirubin: 0.6 mg/dL (ref 0.3–1.2)
Total Protein: 8.8 g/dL — ABNORMAL HIGH (ref 6.5–8.1)

## 2015-05-13 LAB — ETHANOL: Alcohol, Ethyl (B): 218 mg/dL — ABNORMAL HIGH (ref <5)

## 2015-05-13 LAB — CBC
HCT: 51 % (ref 39.0–52.0)
Hemoglobin: 18.1 g/dL — ABNORMAL HIGH (ref 13.0–17.0)
MCH: 33.2 pg (ref 26.0–34.0)
MCHC: 35.5 g/dL (ref 30.0–36.0)
MCV: 93.4 fL (ref 78.0–100.0)
Platelets: 475 10*3/uL — ABNORMAL HIGH (ref 150–400)
RBC: 5.46 MIL/uL (ref 4.22–5.81)
RDW: 13.8 % (ref 11.5–15.5)
WBC: 8 10*3/uL (ref 4.0–10.5)

## 2015-05-13 MED ORDER — ACETAMINOPHEN 325 MG PO TABS: 650.0000 mg | ORAL_TABLET | Freq: Four times a day (QID) | ORAL | Status: DC | PRN

## 2015-05-13 MED ORDER — GABAPENTIN 300 MG PO CAPS
300.0000 mg | ORAL_CAPSULE | Freq: Three times a day (TID) | ORAL | Status: DC
  Administered 2015-05-13 – 2015-05-14 (×3): 300 mg via ORAL
  Filled 2015-05-13 (×3): qty 1

## 2015-05-13 MED ORDER — MAGNESIUM HYDROXIDE 400 MG/5ML PO SUSP: 30.0000 mL | Freq: Every day | ORAL | Status: DC | PRN

## 2015-05-13 MED ORDER — LOPERAMIDE HCL 2 MG PO CAPS
2.0000 mg | ORAL_CAPSULE | ORAL | Status: DC | PRN
  Administered 2015-05-14: 4 mg via ORAL
  Filled 2015-05-13: qty 2

## 2015-05-13 MED ORDER — ADULT MULTIVITAMIN W/MINERALS CH
1.0000 | ORAL_TABLET | Freq: Every day | ORAL | Status: DC
  Administered 2015-05-13 – 2015-05-14 (×2): 1 via ORAL
  Filled 2015-05-13 (×2): qty 1

## 2015-05-13 MED ORDER — SERTRALINE HCL 50 MG PO TABS
75.0000 mg | ORAL_TABLET | Freq: Every day | ORAL | Status: DC
  Administered 2015-05-13 – 2015-05-14 (×2): 75 mg via ORAL
  Filled 2015-05-13 (×4): qty 1

## 2015-05-13 MED ORDER — VITAMIN B-1 100 MG PO TABS
100.0000 mg | ORAL_TABLET | Freq: Every day | ORAL | Status: DC
  Administered 2015-05-14: 100 mg via ORAL
  Filled 2015-05-13: qty 1

## 2015-05-13 MED ORDER — NICOTINE 21 MG/24HR TD PT24
21.0000 mg | MEDICATED_PATCH | Freq: Every day | TRANSDERMAL | Status: DC
  Administered 2015-05-13 – 2015-05-14 (×2): 21 mg via TRANSDERMAL
  Filled 2015-05-13 (×2): qty 1

## 2015-05-13 MED ORDER — ONDANSETRON 4 MG PO TBDP: 4.0000 mg | ORAL_TABLET | Freq: Four times a day (QID) | ORAL | Status: DC | PRN

## 2015-05-13 MED ORDER — ALUM & MAG HYDROXIDE-SIMETH 200-200-20 MG/5ML PO SUSP: 30.0000 mL | ORAL | Status: DC | PRN

## 2015-05-13 MED ORDER — CHLORDIAZEPOXIDE HCL 25 MG PO CAPS: 25.0000 mg | ORAL_CAPSULE | Freq: Four times a day (QID) | ORAL | Status: DC | PRN

## 2015-05-13 MED ORDER — LORAZEPAM 1 MG PO TABS: 0.0000 mg | ORAL_TABLET | Freq: Two times a day (BID) | ORAL | Status: DC

## 2015-05-13 MED ORDER — CHLORDIAZEPOXIDE HCL 25 MG PO CAPS
25.0000 mg | ORAL_CAPSULE | Freq: Four times a day (QID) | ORAL | Status: AC
  Administered 2015-05-13 – 2015-05-14 (×4): 25 mg via ORAL
  Filled 2015-05-13 (×4): qty 1

## 2015-05-13 MED ORDER — ZIPRASIDONE HCL 40 MG PO CAPS
40.0000 mg | ORAL_CAPSULE | Freq: Two times a day (BID) | ORAL | Status: DC
  Administered 2015-05-13 – 2015-05-14 (×2): 40 mg via ORAL
  Filled 2015-05-13 (×2): qty 1

## 2015-05-13 MED ORDER — ONDANSETRON HCL 4 MG PO TABS: 4.0000 mg | ORAL_TABLET | Freq: Three times a day (TID) | ORAL | Status: DC | PRN

## 2015-05-13 MED ORDER — CHLORDIAZEPOXIDE HCL 25 MG PO CAPS: 25.0000 mg | ORAL_CAPSULE | ORAL | Status: DC

## 2015-05-13 MED ORDER — LORAZEPAM 1 MG PO TABS: 2.0000 mg | ORAL_TABLET | Freq: Once | ORAL | Status: DC

## 2015-05-13 MED ORDER — CHLORDIAZEPOXIDE HCL 25 MG PO CAPS: 25.0000 mg | ORAL_CAPSULE | Freq: Three times a day (TID) | ORAL | Status: DC

## 2015-05-13 MED ORDER — HYDROXYZINE HCL 25 MG PO TABS
25.0000 mg | ORAL_TABLET | Freq: Four times a day (QID) | ORAL | Status: DC | PRN
  Administered 2015-05-13: 25 mg via ORAL
  Filled 2015-05-13: qty 1

## 2015-05-13 MED ORDER — NICOTINE 21 MG/24HR TD PT24: 21.0000 mg | MEDICATED_PATCH | Freq: Every day | TRANSDERMAL | Status: DC

## 2015-05-13 MED ORDER — LORAZEPAM 1 MG PO TABS
0.0000 mg | ORAL_TABLET | Freq: Four times a day (QID) | ORAL | Status: DC
  Administered 2015-05-13: 2 mg via ORAL
  Administered 2015-05-13: 1 mg via ORAL
  Filled 2015-05-13: qty 2
  Filled 2015-05-13: qty 1

## 2015-05-13 MED ORDER — CHLORDIAZEPOXIDE HCL 25 MG PO CAPS: 25.0000 mg | ORAL_CAPSULE | Freq: Every day | ORAL | Status: DC

## 2015-05-13 NOTE — ED Notes (Signed)
Pt. Discharged ambulatory with [facility] driver.  All belongings were returned to Pt.  He was in no distress at discharge.

## 2015-05-13 NOTE — H&P (Signed)
Observation Admission Assessment Adult  Patient Identification: [name] MRN:  [id number] Date of Evaluation:  05/13/2015 Chief Complaint: "I started drinking again very heavily and became suicidal."  Principal Diagnosis: Alcohol-induced mood disorder Diagnosis:   Patient Active Problem List   Diagnosis Date Noted  . Schizoaffective disorder [F25.9] 05/13/2015  . Alcohol-induced mood disorder [F10.94] 05/05/2015  . Suicidal ideation [R45.851] 04/09/2015  . Suicide ideation [R45.851]   . Major depressive disorder, recurrent episode, mild [F33.0]   . Alcohol dependence with alcohol-induced mood disorder [F10.24]   . Alcohol use disorder, severe, dependence [F10.20] 11/29/2014  . Current smoker [Z72.0] 11/20/2014  . Tremors [G25.2] 11/03/2014  . Noncompliance with medication regimen [Z91.14]   . Post traumatic stress disorder (PTSD) [F43.10] 11/03/2011   History of Present Illness::  [name] is an 44 y.o. male who presents to [facility] emergency department with the presenting problem of suicidal ideations and excessive alcohol consumption. Patient reports that he binge drinks everyday in order to cope with his depression. He reports that he has been experiencing severe suicidal ideations for the past few weeks without any known trigger. His blood alcohol level on admission was 218. Per review of epic the patient was discharged from the [facility] unit on 05/06/2015 and reports relapsing soon after leaving. He reports wanting outpatient resources to address his alcohol abuse and continues to report that his mother will be moving in with him soon for added support. He stated today "I drink a case of beer a day. I drink all day. I am very lonely. I am hearing the voice of Jesus and Lucifer. I see some crooked lines, which I think is from the alcohol. That is not a normal hallucination for me. I have passive SI with no plan. I was at [facility] before in the past. I attempted  suicide about five years ago by overdosing on pills and drinking vodka." The patient was cooperative with assessment and appeared to have some mild withdrawal symptoms present. He denies experiencing any command hallucinations but endorses chronic auditory hallucinations. The patient admits to being compliant with psychiatric medications for the "most part" but misses doses when drinking heavily. Due to this report his medications were restarted at lower doses. His urine drug screen for marijuana and alcohol level was 218.   Elements:  Location:  Alcohol intoxication, suicidal thoughts. Quality:  alcohol abuse impairing compliance with medications. Severity:  Severe. Timing:  Last few days . Duration:  Chronic. Context:  History of chronic mental illness and substance abuse. Associated Signs/Symptoms: Depression Symptoms:  depressed mood, difficulty concentrating, anxiety, (Hypo) Manic Symptoms:  Impulsivity, Anxiety Symptoms:  Excessive Worry, Psychotic Symptoms:  auditory hallucinations but stable with medications at this time PTSD Symptoms: Denies Total Time spent with patient: 1 hour  Past Medical History:  Past Medical History  Diagnosis Date  . Psychosis   . PTSD (post-traumatic stress disorder)   . Seizure disorder     related to etoh seizure  . Alcohol abuse   . Schizoaffective disorder   . Anxiety     at age 16  . Depression     at age 36    Past Surgical History  Procedure Laterality Date  . Foot surgery      right  . Hand surgery     Family History:  Family History  Problem Relation Age of Onset  . Alcoholism Mother   . CAD Father   . Depression Brother    Social History:  History  Alcohol Use  . Yes    Comment: "I'm a binge drinker"  I drink case of beer per night     History  Drug Use  . 1.00 per week  . Special: Marijuana    Comment: THC 2 to 3 times per month    Social History   Social History  . Marital Status: Divorced    Spouse Name: N/A   . Number of Children: N/A  . Years of Education: N/A   Social History Main Topics  . Smoking status: Current Every Day Smoker -- 1.00 packs/day for 24 years    Types: Cigarettes  . Smokeless tobacco: Never Used  . Alcohol Use: Yes     Comment: "I'm a binge drinker"  I drink case of beer per night  . Drug Use: 1.00 per week    Special: Marijuana     Comment: THC 2 to 3 times per month  . Sexual Activity: Yes    Birth Control/ Protection: Condom   Other Topics Concern  . None   Social History Narrative   Additional Social History:    Pain Medications: See PTA list Prescriptions: see PTA list Over the Counter: Pt denies abuse.  History of alcohol / drug use?: Yes Longest period of sobriety (when/how long): 3.5 years Negative Consequences of Use: Legal, Personal relationships Withdrawal Symptoms: Tremors Date of most recent seizure: Several years ago Name of Substance 1: ETOH 1 - Age of First Use: 14 1 - Amount (size/oz): varies 1 - Frequency: daily 1 - Duration: years 1 - Last Use / Amount: 05-12-15/ 1 case of beer Name of Substance 2: Marijuana 2 - Age of First Use: 15 2 - Amount (size/oz): varies 2 - Frequency: 2x a month 2 - Duration: years 2 - Last Use / Amount: "a few weeks ago" per patient                  Musculoskeletal: Strength & Muscle Tone: within normal limits Gait & Station: normal Patient leans: N/A  Psychiatric Specialty Exam: Physical Exam  Psychiatric: He has a normal mood and affect. His speech is normal and behavior is normal. Thought content normal. Cognition and memory are normal. He expresses impulsivity.    Review of Systems  Constitutional: Positive for chills and diaphoresis.  HENT: Negative.   Eyes: Negative.   Respiratory: Negative.   Cardiovascular: Negative.   Gastrointestinal: Negative.   Genitourinary: Negative.   Skin: Negative.   Neurological: Positive for tremors.  Endo/Heme/Allergies: Negative.    Psychiatric/Behavioral: Positive for depression (Denies), suicidal ideas (Denies), hallucinations (Denies) and substance abuse. Negative for memory loss (Denies). The patient is nervous/anxious (Denies) and has insomnia (Denies).   All other systems reviewed and are negative.   Blood pressure 127/100, pulse 107, temperature 98 F (36.7 C), temperature source Oral, resp. rate 18, height 5' 11"  (1.803 m), weight 90.719 kg (200 lb).Body mass index is 27.91 kg/(m^2).  General Appearance: Disheveled  Eye Contact::  Good  Speech:  Clear and Coherent and Normal Rate  Volume:  Normal  Mood:  Dysphoric and Worthless  Affect:  Constricted  Thought Process:  Circumstantial, Coherent and Goal Directed  Orientation:  Full (Time, Place, and Person)  Thought Content:  Hallucinations: Auditory Visual  Suicidal Thoughts:  Yes.  without intent/plan  Homicidal Thoughts:  No  Memory:  Immediate;   Good Recent;   Good Remote;   Good  Judgement:  Fair  Insight:  Present  Psychomotor Activity:  Normal  Concentration:  Fair  Recall:  Good  Fund of Knowledge:Good  Language: Good  Akathisia:  No  Handed:  Right  AIMS (if indicated):     Assets:  Communication Skills Desire for Improvement Housing Physical Health Social Support  ADL's:  Intact  Cognition: WNL  Sleep:      Risk to Self: Is patient at risk for suicide?: Yes Risk to Others:   Prior Inpatient Therapy:   Prior Outpatient Therapy:    Alcohol Screening: 1. How often do you have a drink containing alcohol?: 2 to 3 times a week 2. How many drinks containing alcohol do you have on a typical day when you are drinking?: 10 or more 3. How often do you have six or more drinks on one occasion?: Monthly Preliminary Score: 6 4. How often during the last year have you found that you were not able to stop drinking once you had started?: Monthly 5. How often during the last year have you failed to do what was normally expected from you becasue  of drinking?: Monthly 6. How often during the last year have you needed a first drink in the morning to get yourself going after a heavy drinking session?: Monthly 7. How often during the last year have you had a feeling of guilt of remorse after drinking?: Monthly 8. How often during the last year have you been unable to remember what happened the night before because you had been drinking?: Monthly 9. Have you or someone else been injured as a result of your drinking?: Yes, during the last year 10. Has a relative or friend or a doctor or another health worker been concerned about your drinking or suggested you cut down?: Yes, during the last year Alcohol Use Disorder Identification Test Final Score (AUDIT): 27 Brief Intervention: Yes  Allergies:   Allergies  Allergen Reactions  . Wellbutrin [Bupropion] Other (See Comments)    Makes me feel like I'm have a seizure   Lab Results:  Results for orders placed or performed during the hospital encounter of 05/13/15 (from the past 48 hour(s))  Comprehensive metabolic panel     Status: Abnormal   Collection Time: 05/13/15  6:45 AM  Result Value Ref Range   Sodium 136 135 - 145 mmol/L    Comment: RESULT REPEATED AND VERIFIED   Potassium 3.9 3.5 - 5.1 mmol/L    Comment: RESULT REPEATED AND VERIFIED   Chloride 93 (L) 101 - 111 mmol/L    Comment: RESULT REPEATED AND VERIFIED   CO2 19 (L) 22 - 32 mmol/L    Comment: RESULT REPEATED AND VERIFIED   Glucose, Bld 88 65 - 99 mg/dL   BUN 10 6 - 20 mg/dL   Creatinine, Ser 0.85 0.61 - 1.24 mg/dL   Calcium 9.4 8.9 - 10.3 mg/dL    Comment: RESULT REPEATED AND VERIFIED   Total Protein 8.8 (H) 6.5 - 8.1 g/dL   Albumin 5.2 (H) 3.5 - 5.0 g/dL   AST 37 15 - 41 U/L   ALT 41 17 - 63 U/L   Alkaline Phosphatase 104 38 - 126 U/L   Total Bilirubin 0.6 0.3 - 1.2 mg/dL   GFR calc non Af Amer >60 >60 mL/min   GFR calc Af Amer >60 >60 mL/min    Comment: (NOTE) The eGFR has been calculated using the CKD EPI  equation. This calculation has not been validated in all clinical situations. eGFR's persistently <60 mL/min signify possible Chronic Kidney Disease.  Anion gap 24 (H) 5 - 15    Comment: REPEATED TO VERIFY  Ethanol (ETOH)     Status: Abnormal   Collection Time: 05/13/15  6:45 AM  Result Value Ref Range   Alcohol, Ethyl (B) 218 (H) <5 mg/dL    Comment:        LOWEST DETECTABLE LIMIT FOR SERUM ALCOHOL IS 5 mg/dL FOR MEDICAL PURPOSES ONLY   CBC     Status: Abnormal   Collection Time: 05/13/15  6:45 AM  Result Value Ref Range   WBC 8.0 4.0 - 10.5 K/uL   RBC 5.46 4.22 - 5.81 MIL/uL   Hemoglobin 18.1 (H) 13.0 - 17.0 g/dL   HCT 51.0 39.0 - 52.0 %   MCV 93.4 78.0 - 100.0 fL   MCH 33.2 26.0 - 34.0 pg   MCHC 35.5 30.0 - 36.0 g/dL   RDW 13.8 11.5 - 15.5 %   Platelets 475 (H) 150 - 400 K/uL  Urine rapid drug screen (hosp performed) (Not at [facility])     Status: Abnormal   Collection Time: 05/13/15  7:00 AM  Result Value Ref Range   Opiates NONE DETECTED NONE DETECTED   Cocaine NONE DETECTED NONE DETECTED   Benzodiazepines NONE DETECTED NONE DETECTED   Amphetamines NONE DETECTED NONE DETECTED   Tetrahydrocannabinol POSITIVE (A) NONE DETECTED   Barbiturates NONE DETECTED NONE DETECTED    Comment:        DRUG SCREEN FOR MEDICAL PURPOSES ONLY.  IF CONFIRMATION IS NEEDED FOR ANY PURPOSE, NOTIFY LAB WITHIN 5 DAYS.        LOWEST DETECTABLE LIMITS FOR URINE DRUG SCREEN Drug Class       Cutoff (ng/mL) Amphetamine      1000 Barbiturate      200 Benzodiazepine   993 Tricyclics       716 Opiates          300 Cocaine          300 THC              50    Current Medications: Current Facility-Administered Medications  Medication Dose Route Frequency [name] Last Rate Last Dose  . acetaminophen (TYLENOL) tablet 650 mg  650 mg Oral Q6H PRN Niel Hummer, NP      . alum & mag hydroxide-simeth (MAALOX/MYLANTA) 200-200-20 MG/5ML suspension 30 mL  30 mL Oral Q4H PRN Niel Hummer, NP      .  chlordiazePOXIDE (LIBRIUM) capsule 25 mg  25 mg Oral Q6H PRN Niel Hummer, NP      . chlordiazePOXIDE (LIBRIUM) capsule 25 mg  25 mg Oral QID Niel Hummer, NP       Followed by  . [START ON 05/14/2015] chlordiazePOXIDE (LIBRIUM) capsule 25 mg  25 mg Oral TID Niel Hummer, NP       Followed by  . [START ON 05/15/2015] chlordiazePOXIDE (LIBRIUM) capsule 25 mg  25 mg Oral BH-qamhs Niel Hummer, NP       Followed by  . [START ON 05/17/2015] chlordiazePOXIDE (LIBRIUM) capsule 25 mg  25 mg Oral Daily Niel Hummer, NP      . gabapentin (NEURONTIN) capsule 300 mg  300 mg Oral TID Niel Hummer, NP      . hydrOXYzine (ATARAX/VISTARIL) tablet 25 mg  25 mg Oral Q6H PRN Niel Hummer, NP   25 mg at 05/13/15 1526  . loperamide (IMODIUM) capsule 2-4 mg  2-4 mg Oral PRN Niel Hummer,  NP      . magnesium hydroxide (MILK OF MAGNESIA) suspension 30 mL  30 mL Oral Daily PRN Niel Hummer, NP      . multivitamin with minerals tablet 1 tablet  1 tablet Oral Daily Niel Hummer, NP   1 tablet at 05/13/15 1513  . nicotine (NICODERM CQ - dosed in mg/24 hours) patch 21 mg  21 mg Transdermal Daily Niel Hummer, NP   21 mg at 05/13/15 1513  . ondansetron (ZOFRAN-ODT) disintegrating tablet 4 mg  4 mg Oral Q6H PRN Niel Hummer, NP      . sertraline (ZOLOFT) tablet 75 mg  75 mg Oral Daily Niel Hummer, NP   75 mg at 05/13/15 1513  . [START ON 05/14/2015] thiamine (VITAMIN B-1) tablet 100 mg  100 mg Oral Daily Niel Hummer, NP      . ziprasidone (GEODON) capsule 40 mg  40 mg Oral BID WC Niel Hummer, NP       PTA Medications: Prescriptions prior to admission  Medication Sig Dispense Refill Last Dose  . acamprosate (CAMPRAL) 333 MG tablet Take 2 tablets (666 mg total) by mouth 3 (three) times daily with meals. 180 tablet 0 05/12/2015 at Unknown time  . allopurinol (ZYLOPRIM) 100 MG tablet Take 1 tablet (100 mg total) by mouth daily. 30 tablet 3 05/12/2015 at Unknown time  . famotidine (PEPCID) 20 MG tablet Take 1  tablet (20 mg total) by mouth 2 (two) times daily. (Patient taking differently: Take 20 mg by mouth 2 (two) times daily as needed (for acid reflux). ) 30 tablet 0 Past Week at Unknown time  . gabapentin (NEURONTIN) 400 MG capsule Take 1 capsule (400 mg total) by mouth 4 (four) times daily. 120 capsule 0 05/12/2015 at Unknown time  . sertraline (ZOLOFT) 25 MG tablet Take 3 tablets (75 mg total) by mouth daily. 90 tablet 0 05/12/2015 at Unknown time  . ziprasidone (GEODON) 60 MG capsule Take 1 capsule (60 mg total) by mouth 2 (two) times daily with a meal. 60 capsule 0 05/12/2015 at Unknown time    Previous Psychotropic Medications: Yes   Substance Abuse History in the last 12 months:  Yes.      Consequences of Substance Abuse: Withdrawal Symptoms:   Cramps Diarrhea Headaches Tremors  Results for orders placed or performed during the hospital encounter of 05/13/15 (from the past 72 hour(s))  Comprehensive metabolic panel     Status: Abnormal   Collection Time: 05/13/15  6:45 AM  Result Value Ref Range   Sodium 136 135 - 145 mmol/L    Comment: RESULT REPEATED AND VERIFIED   Potassium 3.9 3.5 - 5.1 mmol/L    Comment: RESULT REPEATED AND VERIFIED   Chloride 93 (L) 101 - 111 mmol/L    Comment: RESULT REPEATED AND VERIFIED   CO2 19 (L) 22 - 32 mmol/L    Comment: RESULT REPEATED AND VERIFIED   Glucose, Bld 88 65 - 99 mg/dL   BUN 10 6 - 20 mg/dL   Creatinine, Ser 0.85 0.61 - 1.24 mg/dL   Calcium 9.4 8.9 - 10.3 mg/dL    Comment: RESULT REPEATED AND VERIFIED   Total Protein 8.8 (H) 6.5 - 8.1 g/dL   Albumin 5.2 (H) 3.5 - 5.0 g/dL   AST 37 15 - 41 U/L   ALT 41 17 - 63 U/L   Alkaline Phosphatase 104 38 - 126 U/L   Total Bilirubin 0.6 0.3 - 1.2 mg/dL  GFR calc non Af Amer >60 >60 mL/min   GFR calc Af Amer >60 >60 mL/min    Comment: (NOTE) The eGFR has been calculated using the CKD EPI equation. This calculation has not been validated in all clinical situations. eGFR's persistently <60  mL/min signify possible Chronic Kidney Disease.    Anion gap 24 (H) 5 - 15    Comment: REPEATED TO VERIFY  Ethanol (ETOH)     Status: Abnormal   Collection Time: 05/13/15  6:45 AM  Result Value Ref Range   Alcohol, Ethyl (B) 218 (H) <5 mg/dL    Comment:        LOWEST DETECTABLE LIMIT FOR SERUM ALCOHOL IS 5 mg/dL FOR MEDICAL PURPOSES ONLY   CBC     Status: Abnormal   Collection Time: 05/13/15  6:45 AM  Result Value Ref Range   WBC 8.0 4.0 - 10.5 K/uL   RBC 5.46 4.22 - 5.81 MIL/uL   Hemoglobin 18.1 (H) 13.0 - 17.0 g/dL   HCT 51.0 39.0 - 52.0 %   MCV 93.4 78.0 - 100.0 fL   MCH 33.2 26.0 - 34.0 pg   MCHC 35.5 30.0 - 36.0 g/dL   RDW 13.8 11.5 - 15.5 %   Platelets 475 (H) 150 - 400 K/uL  Urine rapid drug screen (hosp performed) (Not at [facility])     Status: Abnormal   Collection Time: 05/13/15  7:00 AM  Result Value Ref Range   Opiates NONE DETECTED NONE DETECTED   Cocaine NONE DETECTED NONE DETECTED   Benzodiazepines NONE DETECTED NONE DETECTED   Amphetamines NONE DETECTED NONE DETECTED   Tetrahydrocannabinol POSITIVE (A) NONE DETECTED   Barbiturates NONE DETECTED NONE DETECTED    Comment:        DRUG SCREEN FOR MEDICAL PURPOSES ONLY.  IF CONFIRMATION IS NEEDED FOR ANY PURPOSE, NOTIFY LAB WITHIN 5 DAYS.        LOWEST DETECTABLE LIMITS FOR URINE DRUG SCREEN Drug Class       Cutoff (ng/mL) Amphetamine      1000 Barbiturate      200 Benzodiazepine   488 Tricyclics       891 Opiates          300 Cocaine          300 THC              50     Observation Level/Precautions:  15 minute checks  Laboratory:  CBC Chemistry Profile UDS  Psychotherapy:  Individual and group sessions  Medications:  Medications started as appropriate for stabilization  Consultations:  Psychiatry  Discharge Concerns:  Safety, stabilization, and risk of access to medication and medication stabilization   Estimated LOS:  24 hour observation or until stable  Other:  Increase collateral information     Psychological Evaluations: Yes   Treatment Plan Summary: Daily contact with patient to assess and evaluate symptoms and progress in treatment and Medication management Start Librium detox protocol due to active withdrawal symptoms Continue Geodon 40 mg bid for psychotic symptoms Continue Neurontin 400 mg TID for anxiety, mood control -Continue Zoloft 75 mg daily for depression  Medical Decision Making:  Review of Psycho-Social Stressors (1), Review or order clinical lab tests (1), Review and summation of old records (2), Review of Last Therapy Session (1), Independent Review of image, tracing or specimen (2) and Review of Medication Regimen & Side Effects (2)  DAVIS, LAURA, NP-C 9/25/20163:39 PM  Case discussed with physician extender on phone formulated  treatment plan. Reviewed the information documented by physician extender and agree with the treatment plan.   JONNALAGADDA,[name] 05/15/2015 10:18 AM
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
## 2015-05-13 NOTE — Progress Notes (Signed)
Pt presents to Observation Unit alert and cooperative "I'm here for alcoholism and depression, I don't know why I'm depressed". +SI, no plan "I don't want to live but I'm afraid to die". -HI/A/V/H.  Reports drinking a case a beer daily, AUDIT score 27, Alcohol Education sheet reviewed with patient, MD notified. Emotional support and encouragement given. Will monitor closely and evaluate for stabilization.

## 2015-05-13 NOTE — ED Notes (Signed)
Per EMS pt presents for ETOH detox.  Last drink was some time in the evening of 6/24.  Hx of seizures w/ detox.  Pt has hx of schizophrenia and is endorsing suicidal ideation w/o plan.  Reports he has been compliant w/ his medications.  Denies drug use.
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
## 2015-05-13 NOTE — Progress Notes (Signed)
[place] INPATIENT:  Family/Significant Other Suicide Prevention Education  Suicide Prevention Education:  Patient Refusal for Family/Significant Other Suicide Prevention Education: The patient [name] has refused to provide written consent for family/significant other to be provided Family/Significant Other Suicide Prevention Education during admission and/or prior to discharge.    [name] 05/13/2015, 1:45 PM

## 2015-05-13 NOTE — ED Provider Notes (Signed)
CSN: [id number]     Arrival date & time 05/13/15  0630 History   First MD Initiated Contact with Patient 05/13/15 [phone]     Chief Complaint  Patient presents with  . Suicidal    Wants ETOH detox     (Consider location/radiation/quality/duration/timing/severity/associated sxs/prior Treatment) HPI Comments: Patient here with suicidal ideations due to increased depression. Also with alcohol abuse and complains of withdrawal symptoms. Last drink was yesterday. States he normally drinks 24 beers a day. Recent admission to psychiatry for similar symptoms. Has failed outpatient therapy. Denies any vomiting or abdominal pain. Denies any tactile hallucinations. Reported seizures last 24 hours. Patient denies any command hallucinations. Symptoms persisted nothing makes them better. No treatment use prior to arrival  The history is provided by the patient.    Past Medical History  Diagnosis Date  . Psychosis   . PTSD (post-traumatic stress disorder)   . Seizure disorder     related to etoh seizure  . Alcohol abuse   . Schizoaffective disorder   . Anxiety     at age 39  . Depression     at age 62   Past Surgical History  Procedure Laterality Date  . Foot surgery      right  . Hand surgery     Family History  Problem Relation Age of Onset  . Alcoholism Mother   . CAD Father   . Depression Brother    Social History  Substance Use Topics  . Smoking status: Current Every Day Smoker -- 1.00 packs/day for 24 years    Types: Cigarettes  . Smokeless tobacco: Never Used  . Alcohol Use: Yes     Comment: "I'm a binge drinker"  I drink case of beer per night    Review of Systems  All other systems reviewed and are negative.     Allergies  Wellbutrin  Home Medications   Prior to Admission medications   Medication Sig Start Date End Date Taking? Authorizing Provider  acamprosate (CAMPRAL) 333 MG tablet Take 2 tablets (666 mg total) by mouth 3 (three) times daily with meals.  03/01/15  Yes Benjamine Mola, FNP  allopurinol (ZYLOPRIM) 100 MG tablet Take 1 tablet (100 mg total) by mouth daily. 03/01/15  Yes Benjamine Mola, FNP  famotidine (PEPCID) 20 MG tablet Take 1 tablet (20 mg total) by mouth 2 (two) times daily. Patient taking differently: Take 20 mg by mouth 2 (two) times daily as needed (for acid reflux).  02/09/15  Yes Shuvon B Rankin, NP  gabapentin (NEURONTIN) 400 MG capsule Take 1 capsule (400 mg total) by mouth 4 (four) times daily. 03/01/15  Yes Benjamine Mola, FNP  sertraline (ZOLOFT) 25 MG tablet Take 3 tablets (75 mg total) by mouth daily. 03/12/15  Yes Hampton Abbot, MD  ziprasidone (GEODON) 60 MG capsule Take 1 capsule (60 mg total) by mouth 2 (two) times daily with a meal. 03/12/15  Yes Hampton Abbot, MD   BP 152/90 mmHg  Pulse 109  Temp(Src) 98.2 F (36.8 C) (Oral)  Resp 20  Ht 5\' 11"  (1.803 m)  Wt 200 lb (90.719 kg)  BMI 27.91 kg/m2  SpO2 95% Physical Exam  Constitutional: He is oriented to person, place, and time. He appears well-developed and well-nourished.  Non-toxic appearance. No distress.  HENT:  Head: Normocephalic and atraumatic.  Eyes: Conjunctivae, EOM and lids are normal. Pupils are equal, round, and reactive to light.  Neck: Normal range of motion. Neck supple. No tracheal  deviation present. No thyroid mass present.  Cardiovascular: Normal rate, regular rhythm and normal heart sounds.  Exam reveals no gallop.   No murmur heard. Pulmonary/Chest: Effort normal and breath sounds normal. No stridor. No respiratory distress. He has no decreased breath sounds. He has no wheezes. He has no rhonchi. He has no rales.  Abdominal: Soft. Normal appearance and bowel sounds are normal. He exhibits no distension. There is no tenderness. There is no rebound and no CVA tenderness.  Musculoskeletal: Normal range of motion. He exhibits no edema or tenderness.  Neurological: He is alert and oriented to person, place, and time. He has normal strength. No  cranial nerve deficit or sensory deficit. GCS eye subscore is 4. GCS verbal subscore is 5. GCS motor subscore is 6.  Skin: Skin is warm and dry. No abrasion and no rash noted.  Psychiatric: His speech is delayed. He is withdrawn. Thought content is delusional. He exhibits a depressed mood. He expresses suicidal ideation. He expresses no homicidal ideation.  Nursing note and vitals reviewed.   ED Course  Procedures (including critical care time) Labs Review Labs Reviewed  COMPREHENSIVE METABOLIC PANEL  ETHANOL  CBC  URINE RAPID DRUG SCREEN, HOSP PERFORMED    Imaging Review No results found. I have personally reviewed and evaluated these images and lab results as part of my medical decision-making.   EKG Interpretation None      MDM   Final diagnoses:  None    Patient will be given Ativan here and will consult tts     Lacretia Leigh, MD 05/13/15 0730

## 2015-05-13 NOTE — Plan of Care (Signed)
[facility] Crisis Plan  Reason for Crisis Plan:  Crisis Stabilization   Plan of Care:  Referral for Substance Abuse  Family Support:      Current Living Environment:  Living Arrangements: Alone  Insurance:   Hospital Account    Name Acct ID Class Status Primary Coverage   [name], [name] [id number] [address] - MEDICARE PART A AND B        Guarantor Account (for Hospital Account [id number])    Name Relation to Pt Service Area Active? Acct Type   [name] [facility] Yes Behavioral [facility]   Address Phone       [address]  [place], [place] [address] [phone])          Coverage Information (for Hospital Account [id number])    F/O Payor/Plan Precert #   MEDICARE/MEDICARE PART A AND B    Subscriber Subscriber #   [name], [name] [id number]   Address Phone   [address] [place], [place] [address]       Legal Guardian:     Primary Care Provider:  Minerva Ends, MD  Current Outpatient Providers:  Beverly Sessions  Psychiatrist:     Counselor/Therapist:     Compliant with Medications:  Yes  Additional Information:   [name] Junes 9/25/20161:42 PM

## 2015-05-13 NOTE — BH Assessment (Signed)
  Patient was seen by D Lillia Mountain and Dianna Rossetti NP upon admission. Patient still has some S/I without a plan and states he is still hearing some voices telling him he is worthless.Patient reports consuming up to 20 beers yesterday and reports some withdrawal symptoms. Patient states they would be interested in a SAIOP at [facility] and will be referred on discharge. Patient reports they were in a program 4 years ago at [facility] for 28 days but would like to try outpatient tx. now since his mother is coming to live with him upon discharge. Patient will be re-evaluated to determine disposition on 9/26.

## 2015-05-13 NOTE — Progress Notes (Signed)
Per Theodoro Clock, NP patient is accepted to OBS Bed 1.   Voluntary admission paperwork signed by patient and faxed by Probation officer.    Boyce Medici. MSW, LCSW Therapeutic Triage Services-Triage Specialist   Phone: [phone]

## 2015-05-14 DIAGNOSIS — F1024 Alcohol dependence with alcohol-induced mood disorder: Secondary | ICD-10-CM | POA: Diagnosis not present

## 2015-05-14 MED ORDER — GABAPENTIN 400 MG PO CAPS: 400.0000 mg | ORAL_CAPSULE | Freq: Four times a day (QID) | ORAL | Status: DC

## 2015-05-14 MED ORDER — ALLOPURINOL 100 MG PO TABS: 100.0000 mg | ORAL_TABLET | Freq: Every day | ORAL | Status: DC

## 2015-05-14 MED ORDER — SERTRALINE HCL 25 MG PO TABS: 75.0000 mg | ORAL_TABLET | Freq: Every day | ORAL | Status: DC

## 2015-05-14 MED ORDER — ZIPRASIDONE HCL 60 MG PO CAPS: 60.0000 mg | ORAL_CAPSULE | Freq: Two times a day (BID) | ORAL | Status: DC

## 2015-05-14 MED ORDER — NICOTINE 21 MG/24HR TD PT24: 21.0000 mg | MEDICATED_PATCH | Freq: Every day | TRANSDERMAL | Status: DC

## 2015-05-14 MED ORDER — ACAMPROSATE CALCIUM 333 MG PO TBEC: 666.0000 mg | DELAYED_RELEASE_TABLET | Freq: Three times a day (TID) | ORAL | Status: DC

## 2015-05-14 NOTE — Progress Notes (Signed)
D: Patient is alert and oriented. Pt's mood and affect is depressed and anxious at times. Pt denies SI/HI and AH. Pt reports VH of "shadows" states this is baseline for him d/t hx of schizoaffective diagnosis. Pt reports he plans to D/C today with follow up plans to attend IOP. Pt C/O diarrhea. A: Active listening by RN. Encouragement/Support provided to pt. PRN medication administered for diarrhea per providers orders (See MAR). Scheduled medications administered per providers orders (See MAR). 15 minute checks continued per protocol for patient safety.  R: Patient cooperative and receptive to nursing interventions. Pt remains safe.

## 2015-05-14 NOTE — Discharge Summary (Signed)
[facility] Discharge Summary Note  Patient:  [name] is an 44 y.o., male MRN:  [id number] DOB:  1970/08/29 Patient phone:  [phone] (home)  Patient address:   [address] [place] [address],  Total Time spent with patient: 30 minutes  Date of Admission:  05/13/2015 Date of Discharge: 05/14/2015  Reason for Admission:  Suicidal ideation   Principal Problem: Alcohol-induced mood disorder Discharge Diagnoses: Patient Active Problem List   Diagnosis Date Noted  . Schizoaffective disorder [F25.9] 05/13/2015  . Alcohol-induced mood disorder [F10.94] 05/05/2015  . Suicidal ideation [R45.851] 04/09/2015  . Suicide ideation [R45.851]   . Major depressive disorder, recurrent episode, mild [F33.0]   . Alcohol dependence with alcohol-induced mood disorder [F10.24]   . Alcohol use disorder, severe, dependence [F10.20] 11/29/2014  . Current smoker [Z72.0] 11/20/2014  . Tremors [G25.2] 11/03/2014  . Noncompliance with medication regimen [Z91.14]   . Post traumatic stress disorder (PTSD) [F43.10] 11/03/2011    Musculoskeletal: Strength & Muscle Tone: within normal limits Gait & Station: normal Patient leans: N/A  Psychiatric Specialty Exam:  See OBS H&P Note Physical Exam  Nursing note and vitals reviewed.   Review of Systems  Constitutional: Negative.   HENT: Negative.   Eyes: Negative.   Respiratory: Negative.   Cardiovascular: Negative.   Gastrointestinal: Negative.   Genitourinary: Negative.   Musculoskeletal: Negative.   Skin: Negative.   Neurological: Negative.   Endo/Heme/Allergies: Negative.   Psychiatric/Behavioral: Positive for depression (Stable) and substance abuse (History of alcohol abuse). Negative for suicidal ideas, hallucinations and memory loss. The patient is not nervous/anxious and does not have insomnia.     Blood pressure 134/83, pulse 102, temperature 98 F (36.7 C), temperature source Oral, resp. rate 18, height _0  (1.803  m), weight 90.719 kg (200 lb).Body mass index is 27.91 kg/(m^2).  Have you used any form of tobacco in the last 30 days? (Cigarettes, Smokeless Tobacco, Cigars, and/or Pipes): Yes  Has this patient used any form of tobacco in the last 30 days? (Cigarettes, Smokeless Tobacco, Cigars, and/or Pipes) Yes, A prescription for an FDA-approved tobacco cessation medication was offered at discharge and the patient refused  Past Medical History:  Past Medical History  Diagnosis Date  . Psychosis   . PTSD (post-traumatic stress disorder)   . Seizure disorder     related to etoh seizure  . Alcohol abuse   . Schizoaffective disorder   . Anxiety     at age 33  . Depression     at age 42    Past Surgical History  Procedure Laterality Date  . Foot surgery      right  . Hand surgery     Family History:  Family History  Problem Relation Age of Onset  . Alcoholism Mother   . CAD Father   . Depression Brother    Social History:  History  Alcohol Use  . Yes    Comment: "I'm a binge drinker"  I drink case of beer per night     History  Drug Use  . 1.00 per week  . Special: Marijuana    Comment: THC 2 to 3 times per month    Social History   Social History  . Marital Status: Divorced    Spouse Name: N/A  . Number of Children: N/A  . Years of Education: N/A   Social History Main Topics  . Smoking status: Current Every Day Smoker -- 1.00 packs/day for 24 years  Types: Cigarettes  . Smokeless tobacco: Never Used  . Alcohol Use: Yes     Comment: "I'm a binge drinker"  I drink case of beer per night  . Drug Use: 1.00 per week    Special: Marijuana     Comment: THC 2 to 3 times per month  . Sexual Activity: Yes    Birth Control/ Protection: Condom   Other Topics Concern  . None   Social History Narrative   Risk to Self: Is patient at risk for suicide?: Yes Risk to Others:   Prior Inpatient Therapy:   Yes Prior Outpatient Therapy:   Yes  Level of Care:  OP  Hospital  Course:   [name] is an 44 y.o. male who presents to [facility] emergency department with the presenting problem of suicidal ideations and excessive alcohol consumption. Patient reports that he binge drinks everyday in order to cope with his depression. He reports that he has been experiencing severe suicidal ideations for the past few weeks without any known trigger. His blood alcohol level on admission was 218. Per review of epic the patient was discharged from the [facility] unit on 05/06/2015 and reports relapsing soon after leaving. He reports wanting outpatient resources to address his alcohol abuse and continues to report that his mother will be moving in with him soon for added support. He stated today "I drink a case of beer a day. I drink all day. I am very lonely. I am hearing the voice of Jesus and Lucifer. I see some crooked lines, which I think is from the alcohol. That is not a normal hallucination for me. I have passive SI with no plan. I was at [facility] before in the past. I attempted suicide about five years ago by overdosing on pills and drinking vodka." The patient was cooperative with assessment and appeared to have some mild withdrawal symptoms present. He denies experiencing any command hallucinations but endorses chronic auditory hallucinations. The patient admits to being compliant with psychiatric medications for the "most part" but misses doses when drinking heavily. Due to this report his medications were restarted at lower doses. His urine drug screen for marijuana and alcohol level was 218.   Patient was admitted to the [facility] Unit for further monitoring. He was started on the Librium detox protocol along with his psychiatric medications that were restarted. Upon follow up the next day the patient denied having any suicidal ideation. He also reported feeling "less shaky and sweaty". The patient did not appear to be experiencing any symptoms of alcohol  withdrawal. Patient appeared motivated to [facility] with [facility] for an Intensive [facility]patient Program. The Observation Counselor contacted [facility] to make an appointment for the patient along with information. Upon completion of this admission the patient was both mentally and medically stable for discharge denying suicidal/homicidal ideation, auditory/visual/tactile hallucinations, delusional thoughts and paranoia.      Consults:  psychiatry  Significant Diagnostic Studies:  labs: Reviewed  Discharge Vitals:   Blood pressure 134/83, pulse 102, temperature 98 F (36.7 C), temperature source Oral, resp. rate 18, height _0  (1.803 m), weight 90.719 kg (200 lb). Body mass index is 27.91 kg/(m^2). Lab Results:   Results for orders placed or performed during the hospital encounter of 05/13/15 (from the past 72 hour(s))  Comprehensive metabolic panel     Status: Abnormal   Collection Time: 05/13/15  6:45 AM  Result Value Ref Range   Sodium 136 135 - 145 mmol/L  Comment: RESULT REPEATED AND VERIFIED   Potassium 3.9 3.5 - 5.1 mmol/L    Comment: RESULT REPEATED AND VERIFIED   Chloride 93 (L) 101 - 111 mmol/L    Comment: RESULT REPEATED AND VERIFIED   CO2 19 (L) 22 - 32 mmol/L    Comment: RESULT REPEATED AND VERIFIED   Glucose, Bld 88 65 - 99 mg/dL   BUN 10 6 - 20 mg/dL   Creatinine, Ser 0.85 0.61 - 1.24 mg/dL   Calcium 9.4 8.9 - 10.3 mg/dL    Comment: RESULT REPEATED AND VERIFIED   Total Protein 8.8 (H) 6.5 - 8.1 g/dL   Albumin 5.2 (H) 3.5 - 5.0 g/dL   AST 37 15 - 41 U/L   ALT 41 17 - 63 U/L   Alkaline Phosphatase 104 38 - 126 U/L   Total Bilirubin 0.6 0.3 - 1.2 mg/dL   GFR calc non Af Amer >60 >60 mL/min   GFR calc Af Amer >60 >60 mL/min    Comment: (NOTE) The eGFR has been calculated using the CKD EPI equation. This calculation has not been validated in all clinical situations. eGFR's persistently <60 mL/min signify possible Chronic Kidney Disease.    Anion gap 24  (H) 5 - 15    Comment: REPEATED TO VERIFY  Ethanol (ETOH)     Status: Abnormal   Collection Time: 05/13/15  6:45 AM  Result Value Ref Range   Alcohol, Ethyl (B) 218 (H) <5 mg/dL    Comment:        LOWEST DETECTABLE LIMIT FOR SERUM ALCOHOL IS 5 mg/dL FOR MEDICAL PURPOSES ONLY   CBC     Status: Abnormal   Collection Time: 05/13/15  6:45 AM  Result Value Ref Range   WBC 8.0 4.0 - 10.5 K/uL   RBC 5.46 4.22 - 5.81 MIL/uL   Hemoglobin 18.1 (H) 13.0 - 17.0 g/dL   HCT 51.0 39.0 - 52.0 %   MCV 93.4 78.0 - 100.0 fL   MCH 33.2 26.0 - 34.0 pg   MCHC 35.5 30.0 - 36.0 g/dL   RDW 13.8 11.5 - 15.5 %   Platelets 475 (H) 150 - 400 K/uL  Urine rapid drug screen (hosp performed) (Not at [facility])     Status: Abnormal   Collection Time: 05/13/15  7:00 AM  Result Value Ref Range   Opiates NONE DETECTED NONE DETECTED   Cocaine NONE DETECTED NONE DETECTED   Benzodiazepines NONE DETECTED NONE DETECTED   Amphetamines NONE DETECTED NONE DETECTED   Tetrahydrocannabinol POSITIVE (A) NONE DETECTED   Barbiturates NONE DETECTED NONE DETECTED    Comment:        DRUG SCREEN FOR MEDICAL PURPOSES ONLY.  IF CONFIRMATION IS NEEDED FOR ANY PURPOSE, NOTIFY LAB WITHIN 5 DAYS.        LOWEST DETECTABLE LIMITS FOR URINE DRUG SCREEN Drug Class       Cutoff (ng/mL) Amphetamine      1000 Barbiturate      200 Benzodiazepine   174 Tricyclics       081 Opiates          300 Cocaine          300 THC              50     Physical Findings: AIMS: Facial and Oral Movements Muscles of Facial Expression: None, normal Lips and Perioral Area: None, normal Jaw: None, normal Tongue: None, normal,Extremity Movements Upper (arms, wrists, hands, fingers): None, normal Lower (legs, knees,  ankles, toes): None, normal, Trunk Movements Neck, shoulders, hips: None, normal, Overall Severity Severity of abnormal movements (highest score from questions above): None, normal Incapacitation due to abnormal movements: None,  normal Patient's awareness of abnormal movements (rate only patient's report): No Awareness, Dental Status Current problems with teeth and/or dentures?: No (missing teeth) Does patient usually wear dentures?: No  CIWA:  CIWA-Ar Total: 5 COWS:      See Psychiatric Specialty Exam and Suicide Risk Assessment completed by Attending Physician prior to discharge.  Discharge destination:  Home  Is patient on multiple antipsychotic therapies at discharge:  No   Has Patient had three or more failed trials of antipsychotic monotherapy by history:  No    Recommended Plan for Multiple Antipsychotic Therapies: NA     Medication List    TAKE these medications      Indication   acamprosate 333 MG tablet  Commonly known as:  CAMPRAL  Take 2 tablets (666 mg total) by mouth 3 (three) times daily with meals.   Indication:  Excessive Use of Alcohol     allopurinol 100 MG tablet  Commonly known as:  ZYLOPRIM  Take 1 tablet (100 mg total) by mouth daily.   Indication:  gout     famotidine 20 MG tablet  Commonly known as:  PEPCID  Take 1 tablet (20 mg total) by mouth 2 (two) times daily.   Indication:  Gastroesophageal Reflux Disease     gabapentin 400 MG capsule  Commonly known as:  NEURONTIN  Take 1 capsule (400 mg total) by mouth 4 (four) times daily.   Indication:  Alcohol Withdrawal Syndrome, Neuropathic Pain     nicotine 21 mg/24hr patch  Commonly known as:  NICODERM CQ - dosed in mg/24 hours  Place 1 patch (21 mg total) onto the skin daily.   Indication:  Nicotine Addiction     sertraline 25 MG tablet  Commonly known as:  ZOLOFT  Take 3 tablets (75 mg total) by mouth daily.   Indication:  Major Depressive Disorder     ziprasidone 60 MG capsule  Commonly known as:  GEODON  Take 1 capsule (60 mg total) by mouth 2 (two) times daily with a meal.   Indication:  mood stabilization         Follow-up recommendations:  Activity:  As tolerated Diet:  As tolerated  Comments:    Patient has been instructed to take medications as prescribed; and report adverse effects to outpatient provider.  Follow up with primary doctor for any medical issues and If symptoms recur report to nearest emergency or crisis hot line.    Total Discharge Time: Greater than 30 minutes  Signed: Elmarie Shiley, NP-C 05/14/2015, 6:02 PM

## 2015-05-14 NOTE — Progress Notes (Signed)
Patient denies pain, SI, AH/VH. Slept throughout the shift. No new complaint. Will continue to monitor patient for stability.

## 2015-05-14 NOTE — Progress Notes (Signed)
DISCHARGE NOTE: D: Patient was alert, oriented, in stable condition, and ambulatory with a steady gait upon discharge. Pt denies SI/HI and AVH. A: AVS reviewed and given to pt. Follow up reviewed with pt. Resources reviewed with pt, including [facility]. Belongings returned to pt. Pt given time to ask questions and express concerns. Pt encouraged to follow up with PCP for any additional medical concerns. R: Pt D/C'd with cab voucher.

## 2015-05-14 NOTE — BH Assessment (Signed)
Patient was discharged this date and referred to [facility] for a Intensive Outpatient Program. This writer contacted [facility] and made an appointment for the patient and was given information in reference to program and services.

## 2015-06-03 ENCOUNTER — Emergency Department (HOSPITAL_COMMUNITY)
Admission: EM | Admit: 2015-06-03 | Discharge: 2015-06-04 | Disposition: A | Discharge status: Home / Self Care | Payer: Medicare Other | Attending: Emergency Medicine | Admitting: Emergency Medicine

## 2015-06-03 ENCOUNTER — Encounter (HOSPITAL_COMMUNITY): Payer: Self-pay | Admitting: Emergency Medicine

## 2015-06-03 DIAGNOSIS — Z79899 Other long term (current) drug therapy: Secondary | ICD-10-CM | POA: Diagnosis not present

## 2015-06-03 DIAGNOSIS — F1010 Alcohol abuse, uncomplicated: Secondary | ICD-10-CM

## 2015-06-03 DIAGNOSIS — K2970 Gastritis, unspecified, without bleeding: Secondary | ICD-10-CM | POA: Diagnosis not present

## 2015-06-03 DIAGNOSIS — F419 Anxiety disorder, unspecified: Secondary | ICD-10-CM | POA: Insufficient documentation

## 2015-06-03 DIAGNOSIS — F1210 Cannabis abuse, uncomplicated: Secondary | ICD-10-CM | POA: Insufficient documentation

## 2015-06-03 DIAGNOSIS — F329 Major depressive disorder, single episode, unspecified: Secondary | ICD-10-CM | POA: Insufficient documentation

## 2015-06-03 DIAGNOSIS — Z720 Tobacco use: Secondary | ICD-10-CM | POA: Diagnosis not present

## 2015-06-03 DIAGNOSIS — F10239 Alcohol dependence with withdrawal, unspecified: Secondary | ICD-10-CM | POA: Diagnosis present

## 2015-06-03 DIAGNOSIS — R109 Unspecified abdominal pain: Secondary | ICD-10-CM | POA: Diagnosis not present

## 2015-06-03 DIAGNOSIS — F10220 Alcohol dependence with intoxication, uncomplicated: Secondary | ICD-10-CM | POA: Insufficient documentation

## 2015-06-03 DIAGNOSIS — F4310 Post-traumatic stress disorder, unspecified: Secondary | ICD-10-CM | POA: Diagnosis not present

## 2015-06-03 DIAGNOSIS — F10920 Alcohol use, unspecified with intoxication, uncomplicated: Secondary | ICD-10-CM

## 2015-06-03 DIAGNOSIS — R112 Nausea with vomiting, unspecified: Secondary | ICD-10-CM | POA: Diagnosis not present

## 2015-06-03 DIAGNOSIS — R197 Diarrhea, unspecified: Secondary | ICD-10-CM | POA: Diagnosis not present

## 2015-06-03 NOTE — ED Provider Notes (Signed)
CSN: [id number]     Arrival date & time 06/03/15  2139 History  By signing my name below, I, Terressa Koyanagi, attest that this documentation has been prepared under the direction and in the presence of Vernee Baines, MD. Electronically Signed: Terressa Koyanagi, ED Scribe. 06/04/2015. 1:15 AM.  Chief Complaint  Patient presents with  . Withdrawal  . Alcohol Problem  . Abdominal Pain   Patient is a 44 y.o. male presenting with intoxication. The history is provided by the patient. No language interpreter was used.  Alcohol Intoxication This is a recurrent problem. The current episode started more than 1 week ago. The problem occurs constantly. The problem has not changed since onset.Pertinent negatives include no chest pain, no headaches and no shortness of breath. Nothing aggravates the symptoms. Nothing relieves the symptoms. He has tried nothing for the symptoms. The treatment provided no relief.   PCP: Minerva Ends, MD HPI Comments: [name] is a 44 y.o. male, with PMHx noted below including EtOH abuse (reports drinking a case of beer/day), psychosis, schizoaffective disorder, brought in by ambulance, who presents to the Emergency Department complaining of EtOH withdrawal with associated diarrhea, and nausea. Pt reports he has been drinking for one week straight and his last drink was approximately 24 hours ago which consisted of a case of beer. Pt reports he is scheduled to go to IOP tomorrow. Pt reports he takes daily Zoloft, Neurontin, however, has not taken any of his meds today.   Past Medical History  Diagnosis Date  . Psychosis   . PTSD (post-traumatic stress disorder)   . Seizure disorder ([place])     related to etoh seizure  . Alcohol abuse   . Schizoaffective disorder   . Anxiety     at age 34  . Depression     at age 24   Past Surgical History  Procedure Laterality Date  . Foot surgery      right  . Hand surgery     Family History  Problem Relation Age of Onset   . Alcoholism Mother   . CAD Father   . Depression Brother    Social History  Substance Use Topics  . Smoking status: Current Every Day Smoker -- 1.00 packs/day for 24 years    Types: Cigarettes  . Smokeless tobacco: Never Used  . Alcohol Use: Yes     Comment: "I'm a binge drinker"  I drink case of beer per night    Review of Systems  Constitutional: Negative for fever.  Respiratory: Negative for cough, shortness of breath and wheezing.   Cardiovascular: Negative for chest pain, palpitations and leg swelling.  Gastrointestinal: Positive for nausea and diarrhea.  Neurological: Negative for headaches.  All other systems reviewed and are negative.  Allergies  Wellbutrin  Home Medications   Prior to Admission medications   Medication Sig Start Date End Date Taking? Authorizing Provider  acamprosate (CAMPRAL) 333 MG tablet Take 2 tablets (666 mg total) by mouth 3 (three) times daily with meals. 05/14/15  Yes Niel Hummer, NP  allopurinol (ZYLOPRIM) 100 MG tablet Take 1 tablet (100 mg total) by mouth daily. 05/14/15  Yes Niel Hummer, NP  famotidine (PEPCID) 20 MG tablet Take 1 tablet (20 mg total) by mouth 2 (two) times daily. Patient taking differently: Take 20 mg by mouth 2 (two) times daily as needed (for acid reflux).  02/09/15  Yes Shuvon B Rankin, NP  gabapentin (NEURONTIN) 400 MG capsule Take 1 capsule (400  mg total) by mouth 4 (four) times daily. 05/14/15  Yes Niel Hummer, NP  sertraline (ZOLOFT) 25 MG tablet Take 3 tablets (75 mg total) by mouth daily. 05/14/15  Yes Niel Hummer, NP  ziprasidone (GEODON) 60 MG capsule Take 1 capsule (60 mg total) by mouth 2 (two) times daily with a meal. 05/14/15  Yes Niel Hummer, NP  nicotine (NICODERM CQ - DOSED IN MG/24 HOURS) 21 mg/24hr patch Place 1 patch (21 mg total) onto the skin daily. Patient not taking: Reported on 06/04/2015 05/14/15   Niel Hummer, NP   Triage Vitals: BP 143/105 mmHg  Pulse 105  Temp(Src) 98 F (36.7 C)  (Oral)  Resp 20  SpO2 98% Physical Exam  Constitutional: He is oriented to person, place, and time. He appears well-developed and well-nourished. No distress.  HENT:  Head: Normocephalic.  Mouth/Throat: Uvula is midline, oropharynx is clear and moist and mucous membranes are normal. No oropharyngeal exudate.  Eyes: EOM are normal. Pupils are equal, round, and reactive to light.  Neck: Normal range of motion. No JVD present.  Cardiovascular: Normal rate, regular rhythm and intact distal pulses.   Pulmonary/Chest: Effort normal and breath sounds normal. No respiratory distress. He has no wheezes. He has no rales.  Abdominal: Soft. Bowel sounds are normal. He exhibits no distension. There is no tenderness. There is no rebound and no guarding.  Musculoskeletal: Normal range of motion.  Neurological: He is alert and oriented to person, place, and time. He has normal reflexes. He displays normal reflexes.  Skin: Skin is warm and dry. He is not diaphoretic.  Psychiatric: He has a normal mood and affect.  Nursing note and vitals reviewed.  ED Course  Procedures (including critical care time) DIAGNOSTIC STUDIES: Oxygen Saturation is 98% on ra, nl by my interpretation.    COORDINATION OF CARE: 12:08 AM: Discussed treatment plan with pt at bedside; patient verbalizes understanding and agrees with treatment plan.  MDM   Final diagnoses:  None    Clearly not withdrawing with Normal HR and BP and RR and etoh level of 199.  Was given home medication which he kept down.  Safe for discharge follow up with IOP this am.    I, Caley Volkert-RASCH,Cordel Drewes K, personally performed the services described in this documentation. All medical record entries made by the scribe were at my direction and in my presence.  I have reviewed the chart and discharge instructions and agree that the record reflects my personal performance and is accurate and complete. Glendora Clouatre-RASCH,Ardean Simonich K.  06/04/2015. 4:45 AM.      Quantay Zaremba, MD 06/04/15 [phone]

## 2015-06-03 NOTE — ED Notes (Signed)
Per EMS pt states he's going through alcohol withdrawls, c/o abd pain and nausea, states hasn't vomited since yesterday, last drink was last night and he usually drinks a case of beer a day.

## 2015-06-04 ENCOUNTER — Emergency Department (HOSPITAL_COMMUNITY): Payer: Medicare Other

## 2015-06-04 ENCOUNTER — Encounter (HOSPITAL_COMMUNITY): Payer: Self-pay | Admitting: Emergency Medicine

## 2015-06-04 DIAGNOSIS — R109 Unspecified abdominal pain: Secondary | ICD-10-CM | POA: Diagnosis not present

## 2015-06-04 LAB — I-STAT CHEM 8, ED
BUN: 9 mg/dL (ref 6–20)
Calcium, Ion: 1.03 mmol/L — ABNORMAL LOW (ref 1.12–1.23)
Chloride: 95 mmol/L — ABNORMAL LOW (ref 101–111)
Creatinine, Ser: 1.1 mg/dL (ref 0.61–1.24)
Glucose, Bld: 89 mg/dL (ref 65–99)
HCT: 54 % — ABNORMAL HIGH (ref 39.0–52.0)
Hemoglobin: 18.4 g/dL — ABNORMAL HIGH (ref 13.0–17.0)
Potassium: 3.9 mmol/L (ref 3.5–5.1)
Sodium: 135 mmol/L (ref 135–145)
TCO2: 23 mmol/L (ref 0–100)

## 2015-06-04 LAB — CBC WITH DIFFERENTIAL/PLATELET
Basophils Absolute: 0 10*3/uL (ref 0.0–0.1)
Basophils Relative: 1 %
Eosinophils Absolute: 0 10*3/uL (ref 0.0–0.7)
Eosinophils Relative: 0 %
HCT: 48.3 % (ref 39.0–52.0)
Hemoglobin: 17 g/dL (ref 13.0–17.0)
Lymphocytes Relative: 39 %
Lymphs Abs: 3.3 10*3/uL (ref 0.7–4.0)
MCH: 32.4 pg (ref 26.0–34.0)
MCHC: 35.2 g/dL (ref 30.0–36.0)
MCV: 92.2 fL (ref 78.0–100.0)
Monocytes Absolute: 0.8 10*3/uL (ref 0.1–1.0)
Monocytes Relative: 10 %
Neutro Abs: 4.3 10*3/uL (ref 1.7–7.7)
Neutrophils Relative %: 50 %
Platelets: 415 10*3/uL — ABNORMAL HIGH (ref 150–400)
RBC: 5.24 MIL/uL (ref 4.22–5.81)
RDW: 13.4 % (ref 11.5–15.5)
WBC: 8.4 10*3/uL (ref 4.0–10.5)

## 2015-06-04 LAB — RAPID URINE DRUG SCREEN, HOSP PERFORMED
Amphetamines: NOT DETECTED
Barbiturates: NOT DETECTED
Benzodiazepines: NOT DETECTED
Cocaine: NOT DETECTED
Opiates: NOT DETECTED
Tetrahydrocannabinol: POSITIVE — AB

## 2015-06-04 LAB — ETHANOL: Alcohol, Ethyl (B): 199 mg/dL — ABNORMAL HIGH (ref <5)

## 2015-06-04 MED ORDER — ONDANSETRON 8 MG PO TBDP
8.0000 mg | ORAL_TABLET | Freq: Once | ORAL | Status: AC
  Administered 2015-06-04: 8 mg via ORAL
  Filled 2015-06-04: qty 2
  Filled 2015-06-04: qty 1

## 2015-06-04 MED ORDER — ZIPRASIDONE HCL 20 MG PO CAPS: 60.0000 mg | ORAL_CAPSULE | Freq: Two times a day (BID) | ORAL | Status: DC

## 2015-06-04 MED ORDER — GABAPENTIN 400 MG PO CAPS
400.0000 mg | ORAL_CAPSULE | Freq: Three times a day (TID) | ORAL | Status: DC
  Administered 2015-06-04: 400 mg via ORAL
  Filled 2015-06-04: qty 1

## 2015-06-04 MED ORDER — LORAZEPAM 0.5 MG PO TABS
0.5000 mg | ORAL_TABLET | Freq: Once | ORAL | Status: AC
  Administered 2015-06-04: 0.5 mg via ORAL
  Filled 2015-06-04: qty 1

## 2015-06-04 MED ORDER — SERTRALINE HCL 50 MG PO TABS: 75.0000 mg | ORAL_TABLET | Freq: Every day | ORAL | Status: DC

## 2015-06-04 MED ORDER — ALBUTEROL SULFATE (2.5 MG/3ML) 0.083% IN NEBU: 5.0000 mg | INHALATION_SOLUTION | Freq: Once | RESPIRATORY_TRACT | Status: DC

## 2015-06-04 MED ORDER — ACETAMINOPHEN 500 MG PO TABS
1000.0000 mg | ORAL_TABLET | Freq: Once | ORAL | Status: AC
  Administered 2015-06-04: 1000 mg via ORAL
  Filled 2015-06-04: qty 2

## 2015-06-04 MED ORDER — GI COCKTAIL ~~LOC~~
30.0000 mL | Freq: Once | ORAL | Status: AC
  Administered 2015-06-04: 30 mL via ORAL
  Filled 2015-06-04: qty 30

## 2015-06-04 NOTE — Discharge Instructions (Signed)

## 2015-06-16 ENCOUNTER — Emergency Department (HOSPITAL_COMMUNITY): Payer: Medicare Other

## 2015-06-16 ENCOUNTER — Encounter (HOSPITAL_COMMUNITY): Payer: Self-pay | Admitting: *Deleted

## 2015-06-16 ENCOUNTER — Emergency Department (HOSPITAL_COMMUNITY)
Admission: EM | Admit: 2015-06-16 | Discharge: 2015-06-17 | Disposition: A | Discharge status: Other Acute Inpatient Hospital | Payer: Medicare Other | Attending: Emergency Medicine | Admitting: Emergency Medicine

## 2015-06-16 DIAGNOSIS — F419 Anxiety disorder, unspecified: Secondary | ICD-10-CM | POA: Insufficient documentation

## 2015-06-16 DIAGNOSIS — J189 Pneumonia, unspecified organism: Secondary | ICD-10-CM

## 2015-06-16 DIAGNOSIS — Z720 Tobacco use: Secondary | ICD-10-CM | POA: Diagnosis not present

## 2015-06-16 DIAGNOSIS — F259 Schizoaffective disorder, unspecified: Secondary | ICD-10-CM | POA: Diagnosis not present

## 2015-06-16 DIAGNOSIS — R05 Cough: Secondary | ICD-10-CM | POA: Diagnosis not present

## 2015-06-16 DIAGNOSIS — F4310 Post-traumatic stress disorder, unspecified: Secondary | ICD-10-CM | POA: Diagnosis not present

## 2015-06-16 DIAGNOSIS — Z79899 Other long term (current) drug therapy: Secondary | ICD-10-CM | POA: Insufficient documentation

## 2015-06-16 DIAGNOSIS — F29 Unspecified psychosis not due to a substance or known physiological condition: Secondary | ICD-10-CM | POA: Diagnosis not present

## 2015-06-16 DIAGNOSIS — G40909 Epilepsy, unspecified, not intractable, without status epilepticus: Secondary | ICD-10-CM | POA: Diagnosis not present

## 2015-06-16 DIAGNOSIS — R45851 Suicidal ideations: Secondary | ICD-10-CM | POA: Diagnosis not present

## 2015-06-16 DIAGNOSIS — F17210 Nicotine dependence, cigarettes, uncomplicated: Secondary | ICD-10-CM | POA: Diagnosis not present

## 2015-06-16 DIAGNOSIS — F329 Major depressive disorder, single episode, unspecified: Secondary | ICD-10-CM | POA: Insufficient documentation

## 2015-06-16 LAB — RAPID URINE DRUG SCREEN, HOSP PERFORMED
Amphetamines: NOT DETECTED
Barbiturates: NOT DETECTED
Benzodiazepines: NOT DETECTED
Cocaine: NOT DETECTED
Opiates: NOT DETECTED
Tetrahydrocannabinol: POSITIVE — AB

## 2015-06-16 LAB — COMPREHENSIVE METABOLIC PANEL
ALT: 52 U/L (ref 17–63)
AST: 30 U/L (ref 15–41)
Albumin: 4.9 g/dL (ref 3.5–5.0)
Alkaline Phosphatase: 82 U/L (ref 38–126)
Anion gap: 13 (ref 5–15)
BUN: 8 mg/dL (ref 6–20)
CO2: 22 mmol/L (ref 22–32)
Calcium: 9.5 mg/dL (ref 8.9–10.3)
Chloride: 103 mmol/L (ref 101–111)
Creatinine, Ser: 0.81 mg/dL (ref 0.61–1.24)
GFR calc Af Amer: >60 mL/min (ref >60)
GFR calc non Af Amer: >60 mL/min (ref >60)
Glucose, Bld: 87 mg/dL (ref 65–99)
Potassium: 3.7 mmol/L (ref 3.5–5.1)
Sodium: 138 mmol/L (ref 135–145)
Total Bilirubin: 0.2 mg/dL — ABNORMAL LOW (ref 0.3–1.2)
Total Protein: 7.9 g/dL (ref 6.5–8.1)

## 2015-06-16 LAB — ETHANOL: Alcohol, Ethyl (B): 43 mg/dL — ABNORMAL HIGH (ref <5)

## 2015-06-16 LAB — CBC
HCT: 46.6 % (ref 39.0–52.0)
Hemoglobin: 16.3 g/dL (ref 13.0–17.0)
MCH: 32.4 pg (ref 26.0–34.0)
MCHC: 35 g/dL (ref 30.0–36.0)
MCV: 92.6 fL (ref 78.0–100.0)
Platelets: 293 10*3/uL (ref 150–400)
RBC: 5.03 MIL/uL (ref 4.22–5.81)
RDW: 13.4 % (ref 11.5–15.5)
WBC: 7.2 10*3/uL (ref 4.0–10.5)

## 2015-06-16 LAB — SALICYLATE LEVEL: Salicylate Lvl: <4 mg/dL (ref 2.8–30.0)

## 2015-06-16 LAB — ACETAMINOPHEN LEVEL: Acetaminophen (Tylenol), Serum: <10 ug/mL — ABNORMAL LOW (ref 10–30)

## 2015-06-16 MED ORDER — ONDANSETRON HCL 4 MG PO TABS
4.0000 mg | ORAL_TABLET | Freq: Three times a day (TID) | ORAL | Status: DC | PRN
  Administered 2015-06-16: 4 mg via ORAL
  Filled 2015-06-16: qty 1

## 2015-06-16 MED ORDER — VITAMIN B-1 100 MG PO TABS
100.0000 mg | ORAL_TABLET | Freq: Every day | ORAL | Status: DC
  Administered 2015-06-16 – 2015-06-17 (×2): 100 mg via ORAL
  Filled 2015-06-16 (×2): qty 1

## 2015-06-16 MED ORDER — ALUM & MAG HYDROXIDE-SIMETH 200-200-20 MG/5ML PO SUSP: 30.0000 mL | ORAL | Status: DC | PRN

## 2015-06-16 MED ORDER — NICOTINE 21 MG/24HR TD PT24
21.0000 mg | MEDICATED_PATCH | Freq: Every day | TRANSDERMAL | Status: DC
  Administered 2015-06-16 – 2015-06-17 (×2): 21 mg via TRANSDERMAL
  Filled 2015-06-16 (×3): qty 1

## 2015-06-16 MED ORDER — LORAZEPAM 2 MG/ML IJ SOLN: 0.0000 mg | Freq: Four times a day (QID) | INTRAMUSCULAR | Status: DC

## 2015-06-16 MED ORDER — THIAMINE HCL 100 MG/ML IJ SOLN: 100.0000 mg | Freq: Every day | INTRAMUSCULAR | Status: DC

## 2015-06-16 MED ORDER — LORAZEPAM 2 MG/ML IJ SOLN: 0.0000 mg | Freq: Two times a day (BID) | INTRAMUSCULAR | Status: DC

## 2015-06-16 MED ORDER — LORAZEPAM 1 MG PO TABS: 0.0000 mg | ORAL_TABLET | Freq: Two times a day (BID) | ORAL | Status: DC

## 2015-06-16 MED ORDER — LORAZEPAM 1 MG PO TABS
0.0000 mg | ORAL_TABLET | Freq: Four times a day (QID) | ORAL | Status: DC
  Administered 2015-06-16 – 2015-06-17 (×3): 1 mg via ORAL
  Filled 2015-06-16 (×3): qty 1

## 2015-06-16 MED ORDER — ACETAMINOPHEN 325 MG PO TABS
650.0000 mg | ORAL_TABLET | ORAL | Status: DC | PRN
  Administered 2015-06-16: 650 mg via ORAL
  Filled 2015-06-16: qty 2

## 2015-06-16 MED ORDER — IBUPROFEN 200 MG PO TABS
600.0000 mg | ORAL_TABLET | Freq: Three times a day (TID) | ORAL | Status: DC | PRN
  Administered 2015-06-16: 600 mg via ORAL
  Filled 2015-06-16: qty 3

## 2015-06-16 NOTE — ED Provider Notes (Signed)
CSN: [id number]     Arrival date & time 06/16/15  1500 History   First MD Initiated Contact with Patient 06/16/15 1556     Chief Complaint  Patient presents with  . Suicidal     (Consider location/radiation/quality/duration/timing/severity/associated sxs/prior Treatment) Patient is a 44 y.o. male presenting with mental health disorder.  Mental Health Problem Presenting symptoms: suicidal thoughts   Presenting symptoms: no delusions and no paranoid behavior   Degree of incapacity (severity):  Mild Onset quality:  Gradual Duration:  2 days Timing:  Constant Progression:  Worsening Chronicity:  New Context: alcohol use and drug abuse   Context: not noncompliant and not recent medication change   Treatment compliance:  Most of the time Relieved by:  None tried Worsened by:  Nothing tried Ineffective treatments:  None tried Associated symptoms: anxiety   Associated symptoms: no abdominal pain, no chest pain and no headaches     Past Medical History  Diagnosis Date  . Psychosis   . PTSD (post-traumatic stress disorder)   . Seizure disorder ([place])     related to etoh seizure  . Alcohol abuse   . Schizoaffective disorder   . Anxiety     at age 55  . Depression     at age 74   Past Surgical History  Procedure Laterality Date  . Foot surgery      right  . Hand surgery     Family History  Problem Relation Age of Onset  . Alcoholism Mother   . CAD Father   . Depression Brother    Social History  Substance Use Topics  . Smoking status: Current Every Day Smoker -- 1.00 packs/day for 24 years    Types: Cigarettes  . Smokeless tobacco: Never Used  . Alcohol Use: Yes     Comment: "I'm a binge drinker"  I drink case of beer per night    Review of Systems  Constitutional: Negative for fever, chills and activity change.  HENT: Negative for congestion and rhinorrhea.   Eyes: Negative for visual disturbance.  Respiratory: Negative for cough and shortness of breath.    Cardiovascular: Negative for chest pain.  Gastrointestinal: Negative for vomiting, abdominal pain, diarrhea and constipation.  Endocrine: Negative for polyuria.  Genitourinary: Negative for dysuria and flank pain.  Musculoskeletal: Negative for back pain and neck pain.  Skin: Negative for wound.  Neurological: Negative for headaches.  Psychiatric/Behavioral: Positive for suicidal ideas. Negative for paranoia. The patient is nervous/anxious.   All other systems reviewed and are negative.     Allergies  Wellbutrin  Home Medications   Prior to Admission medications   Medication Sig Start Date End Date Taking? Authorizing Provider  acamprosate (CAMPRAL) 333 MG tablet Take 2 tablets (666 mg total) by mouth 3 (three) times daily with meals. 05/14/15  Yes Niel Hummer, NP  allopurinol (ZYLOPRIM) 100 MG tablet Take 1 tablet (100 mg total) by mouth daily. 05/14/15  Yes Niel Hummer, NP  famotidine (PEPCID) 20 MG tablet Take 1 tablet (20 mg total) by mouth 2 (two) times daily. Patient taking differently: Take 20 mg by mouth 2 (two) times daily as needed for heartburn or indigestion (for acid reflux).  02/09/15  Yes Shuvon B Rankin, NP  gabapentin (NEURONTIN) 400 MG capsule Take 1 capsule (400 mg total) by mouth 4 (four) times daily. 05/14/15  Yes Niel Hummer, NP  sertraline (ZOLOFT) 25 MG tablet Take 3 tablets (75 mg total) by mouth daily. 05/14/15  Yes  Niel Hummer, NP  ziprasidone (GEODON) 60 MG capsule Take 1 capsule (60 mg total) by mouth 2 (two) times daily with a meal. 05/14/15  Yes Niel Hummer, NP  nicotine (NICODERM CQ - DOSED IN MG/24 HOURS) 21 mg/24hr patch Place 1 patch (21 mg total) onto the skin daily. Patient not taking: Reported on 06/04/2015 05/14/15   Niel Hummer, NP   BP 136/90 mmHg  Pulse 93  Temp(Src) 98.5 F (36.9 C) (Oral)  Resp 22  SpO2 95% Physical Exam  Constitutional: He appears well-developed and well-nourished.  HENT:  Head: Normocephalic and atraumatic.   Neck: Normal range of motion.  Cardiovascular: Normal rate.   Pulmonary/Chest: Effort normal. No respiratory distress.  Abdominal: He exhibits no distension.  Musculoskeletal: Normal range of motion.  Neurological: He is alert.  Psychiatric: His mood appears anxious. Thought content is paranoid. Thought content is not delusional. He expresses suicidal ideation. He expresses no homicidal ideation. He expresses suicidal plans. He expresses no homicidal plans.  Nursing note and vitals reviewed.   ED Course  Procedures (including critical care time) Labs Review Labs Reviewed  COMPREHENSIVE METABOLIC PANEL - Abnormal; Notable for the following:    Total Bilirubin 0.2 (*)    All other components within normal limits  ETHANOL - Abnormal; Notable for the following:    Alcohol, Ethyl (B) 43 (*)    All other components within normal limits  ACETAMINOPHEN LEVEL - Abnormal; Notable for the following:    Acetaminophen (Tylenol), Serum <10 (*)    All other components within normal limits  URINE RAPID DRUG SCREEN, HOSP PERFORMED - Abnormal; Notable for the following:    Tetrahydrocannabinol POSITIVE (*)    All other components within normal limits  SALICYLATE LEVEL  CBC    Imaging Review Dg Chest 2 View  06/16/2015  CLINICAL DATA:  Productive cough for 3 weeks EXAM: CHEST  2 VIEW COMPARISON:  June 04, 2015 FINDINGS: There is no edema or consolidation. Heart size and pulmonary vascularity are normal. No adenopathy. No bone lesions. IMPRESSION: No edema or consolidation. Electronically Signed   By: Lowella Grip III M.D.   On: 06/16/2015 17:56   I have personally reviewed and evaluated these images and lab results as part of my medical decision-making.   EKG Interpretation None      MDM   Diagnosis:  Suicidal IDeation  Suicidal with a plan to take a bunch of pills. States he took Neurontin last night however does not appear to have any toxidrome at this point. Doesn't appear to  be actively withdrawing from alcohol with normal vital signs and only a slight tremor. No other obvious medical issues so we'll consult TTS for further evaluation.      Merrily Pew, MD 06/17/15 [phone]

## 2015-06-16 NOTE — ED Notes (Signed)
Personal items at NS and wanded.
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
## 2015-06-16 NOTE — ED Notes (Signed)
Blood draw by triage nurse

## 2015-06-16 NOTE — BH Assessment (Signed)
Consulted with Serena Colonel, NOP who recommends inpatient treatment at this time.  Contacted Letitia Libra, RN, [facility] for bed. Informed EDP of recommendation.    Rosalin Hawking, LCSW Therapeutic Triage Specialist [place] 06/16/2015 5:23 PM

## 2015-06-16 NOTE — ED Notes (Addendum)
Pt reports he is SI, tried to kill himself last night by taking a handful of neurotin pills, pt normally takes 400mg  neurontin 4x/daily. Pt reports nothing happened, he just fell asleep. Reports he is ETOH withdrawal, normally drink 1 case/day, has not drank since this morning. Hx of schizoaffective disorder, has been hallucinating x2 days, seeing "shadow people", AH-voices telling him he "would be better off dead". Reports headache and abdominal pain 7/10. Pt does not contract for safety.   Denies HI.

## 2015-06-16 NOTE — ED Notes (Signed)
Pt reported taking unknown amt of Neurontin in an attempt to commit SI yesterday. Pt also report drinking ETOH.

## 2015-06-17 ENCOUNTER — Encounter (HOSPITAL_COMMUNITY): Payer: Self-pay | Admitting: *Deleted

## 2015-06-17 ENCOUNTER — Inpatient Hospital Stay (HOSPITAL_COMMUNITY)
Admission: AD | Admit: 2015-06-17 | Discharge: 2015-06-19 | DRG: 897 | Disposition: A | Discharge status: Home / Self Care | Payer: Medicare Other | Source: Intra-hospital | Attending: Psychiatry | Admitting: Psychiatry

## 2015-06-17 DIAGNOSIS — F29 Unspecified psychosis not due to a substance or known physiological condition: Secondary | ICD-10-CM | POA: Diagnosis not present

## 2015-06-17 DIAGNOSIS — F330 Major depressive disorder, recurrent, mild: Secondary | ICD-10-CM | POA: Diagnosis present

## 2015-06-17 DIAGNOSIS — Z8249 Family history of ischemic heart disease and other diseases of the circulatory system: Secondary | ICD-10-CM | POA: Diagnosis not present

## 2015-06-17 DIAGNOSIS — F259 Schizoaffective disorder, unspecified: Secondary | ICD-10-CM | POA: Diagnosis present

## 2015-06-17 DIAGNOSIS — F329 Major depressive disorder, single episode, unspecified: Secondary | ICD-10-CM | POA: Diagnosis not present

## 2015-06-17 DIAGNOSIS — F1014 Alcohol abuse with alcohol-induced mood disorder: Secondary | ICD-10-CM | POA: Diagnosis present

## 2015-06-17 DIAGNOSIS — Z818 Family history of other mental and behavioral disorders: Secondary | ICD-10-CM

## 2015-06-17 DIAGNOSIS — R45851 Suicidal ideations: Secondary | ICD-10-CM | POA: Diagnosis not present

## 2015-06-17 DIAGNOSIS — F17210 Nicotine dependence, cigarettes, uncomplicated: Secondary | ICD-10-CM | POA: Diagnosis present

## 2015-06-17 DIAGNOSIS — F419 Anxiety disorder, unspecified: Secondary | ICD-10-CM | POA: Diagnosis not present

## 2015-06-17 DIAGNOSIS — Z811 Family history of alcohol abuse and dependence: Secondary | ICD-10-CM | POA: Diagnosis not present

## 2015-06-17 DIAGNOSIS — F1020 Alcohol dependence, uncomplicated: Secondary | ICD-10-CM | POA: Diagnosis present

## 2015-06-17 DIAGNOSIS — F1024 Alcohol dependence with alcohol-induced mood disorder: Secondary | ICD-10-CM | POA: Diagnosis present

## 2015-06-17 DIAGNOSIS — G40909 Epilepsy, unspecified, not intractable, without status epilepticus: Secondary | ICD-10-CM | POA: Diagnosis not present

## 2015-06-17 DIAGNOSIS — F4310 Post-traumatic stress disorder, unspecified: Secondary | ICD-10-CM | POA: Diagnosis present

## 2015-06-17 DIAGNOSIS — F1011 Alcohol abuse, in remission: Secondary | ICD-10-CM | POA: Insufficient documentation

## 2015-06-17 DIAGNOSIS — Z789 Other specified health status: Secondary | ICD-10-CM | POA: Diagnosis not present

## 2015-06-17 DIAGNOSIS — F251 Schizoaffective disorder, depressive type: Secondary | ICD-10-CM | POA: Diagnosis not present

## 2015-06-17 DIAGNOSIS — Y902 Blood alcohol level of 40-59 mg/100 ml: Secondary | ICD-10-CM | POA: Diagnosis present

## 2015-06-17 DIAGNOSIS — J189 Pneumonia, unspecified organism: Secondary | ICD-10-CM | POA: Diagnosis not present

## 2015-06-17 DIAGNOSIS — F1094 Alcohol use, unspecified with alcohol-induced mood disorder: Secondary | ICD-10-CM | POA: Diagnosis not present

## 2015-06-17 MED ORDER — ONDANSETRON 4 MG PO TBDP: 4.0000 mg | ORAL_TABLET | Freq: Four times a day (QID) | ORAL | Status: DC | PRN

## 2015-06-17 MED ORDER — ADULT MULTIVITAMIN W/MINERALS CH
1.0000 | ORAL_TABLET | Freq: Every day | ORAL | Status: DC
  Administered 2015-06-17 – 2015-06-19 (×3): 1 via ORAL
  Filled 2015-06-17 (×5): qty 1

## 2015-06-17 MED ORDER — CHLORDIAZEPOXIDE HCL 25 MG PO CAPS: 25.0000 mg | ORAL_CAPSULE | ORAL | Status: DC

## 2015-06-17 MED ORDER — ALLOPURINOL 100 MG PO TABS
100.0000 mg | ORAL_TABLET | Freq: Every day | ORAL | Status: DC
  Administered 2015-06-17 – 2015-06-19 (×3): 100 mg via ORAL
  Filled 2015-06-17 (×5): qty 1

## 2015-06-17 MED ORDER — LOPERAMIDE HCL 2 MG PO CAPS: 2.0000 mg | ORAL_CAPSULE | ORAL | Status: DC | PRN

## 2015-06-17 MED ORDER — ALUM & MAG HYDROXIDE-SIMETH 200-200-20 MG/5ML PO SUSP: 30.0000 mL | ORAL | Status: DC | PRN

## 2015-06-17 MED ORDER — CHLORDIAZEPOXIDE HCL 25 MG PO CAPS: 25.0000 mg | ORAL_CAPSULE | Freq: Every day | ORAL | Status: DC

## 2015-06-17 MED ORDER — CHLORDIAZEPOXIDE HCL 25 MG PO CAPS: 25.0000 mg | ORAL_CAPSULE | Freq: Four times a day (QID) | ORAL | Status: DC | PRN

## 2015-06-17 MED ORDER — GABAPENTIN 400 MG PO CAPS
400.0000 mg | ORAL_CAPSULE | Freq: Four times a day (QID) | ORAL | Status: DC
  Administered 2015-06-17 – 2015-06-19 (×8): 400 mg via ORAL
  Filled 2015-06-17 (×13): qty 1

## 2015-06-17 MED ORDER — ACETAMINOPHEN 325 MG PO TABS: 650.0000 mg | ORAL_TABLET | ORAL | Status: DC | PRN

## 2015-06-17 MED ORDER — CHLORDIAZEPOXIDE HCL 25 MG PO CAPS
25.0000 mg | ORAL_CAPSULE | Freq: Four times a day (QID) | ORAL | Status: AC
  Administered 2015-06-17 – 2015-06-18 (×6): 25 mg via ORAL
  Filled 2015-06-17 (×6): qty 1

## 2015-06-17 MED ORDER — ZIPRASIDONE HCL 60 MG PO CAPS
60.0000 mg | ORAL_CAPSULE | Freq: Two times a day (BID) | ORAL | Status: DC
  Administered 2015-06-17 – 2015-06-19 (×4): 60 mg via ORAL
  Filled 2015-06-17 (×6): qty 1
  Filled 2015-06-17: qty 3

## 2015-06-17 MED ORDER — CHLORDIAZEPOXIDE HCL 25 MG PO CAPS
25.0000 mg | ORAL_CAPSULE | Freq: Three times a day (TID) | ORAL | Status: DC
  Administered 2015-06-19 (×2): 25 mg via ORAL
  Filled 2015-06-17 (×2): qty 1

## 2015-06-17 MED ORDER — NICOTINE 21 MG/24HR TD PT24
21.0000 mg | MEDICATED_PATCH | Freq: Every day | TRANSDERMAL | Status: DC
  Administered 2015-06-18 – 2015-06-19 (×2): 21 mg via TRANSDERMAL
  Filled 2015-06-17 (×4): qty 1

## 2015-06-17 MED ORDER — FAMOTIDINE 20 MG PO TABS
20.0000 mg | ORAL_TABLET | Freq: Two times a day (BID) | ORAL | Status: DC
  Administered 2015-06-17 – 2015-06-19 (×4): 20 mg via ORAL
  Filled 2015-06-17 (×7): qty 1

## 2015-06-17 MED ORDER — SERTRALINE HCL 50 MG PO TABS
75.0000 mg | ORAL_TABLET | Freq: Every day | ORAL | Status: DC
  Administered 2015-06-17 – 2015-06-19 (×3): 75 mg via ORAL
  Filled 2015-06-17 (×6): qty 1

## 2015-06-17 MED ORDER — HYDROXYZINE HCL 25 MG PO TABS
25.0000 mg | ORAL_TABLET | Freq: Four times a day (QID) | ORAL | Status: DC | PRN
  Administered 2015-06-18: 25 mg via ORAL
  Filled 2015-06-17: qty 1

## 2015-06-17 MED ORDER — IBUPROFEN 600 MG PO TABS
600.0000 mg | ORAL_TABLET | Freq: Three times a day (TID) | ORAL | Status: DC | PRN
Start: 2015-06-17 — End: 2015-06-19

## 2015-06-17 NOTE — Progress Notes (Signed)
NSG Admit Note: 44yo Male admitted to [facility] services on the Adult 300 hallway inpt unit for further evaluation and treatment of substance induced mood disorder and reported auditory hallucinations. Pt presents Vol. From [facility] ED following SI with an intentional OD on his Neurontin. Pt also has a history of multiple admits here to OBS as well as inpt treatment. Pt is cooperative ,pleasant on admit. Oriented to room and handbook given. No complaints of pain at this time.

## 2015-06-17 NOTE — H&P (Signed)
Observation Admission Assessment Adult  Patient Identification: [name] MRN:  [id number] Date of Evaluation:  06/17/2015 Chief Complaint: "I started drinking again after talking to my ex wife and it brought back a lot things and I became suicidal."  Principal Diagnosis: <principal problem not specified> Diagnosis:   Patient Active Problem List   Diagnosis Date Noted  . Schizoaffective disorder, depressive type ([place]) [F25.1]   . Alcohol use ([place]) [Z78.9]   . Schizoaffective disorder ([place]) [F25.9] 05/13/2015  . Alcohol-induced mood disorder ([place]) [F10.94] 05/05/2015  . Suicidal ideation [R45.851] 04/09/2015  . Suicide ideation [R45.851]   . Major depressive disorder, recurrent episode, mild ([place]) [F33.0]   . Alcohol dependence with alcohol-induced mood disorder ([place]) [F10.24]   . Alcohol use disorder, severe, dependence ([place]) [F10.20] 11/29/2014  . Current smoker [Z72.0] 11/20/2014  . Tremors [G25.2] 11/03/2014  . Noncompliance with medication regimen [Z91.14]   . Post traumatic stress disorder (PTSD) [F43.10] 11/03/2011   History of Present Illness::  [name] is an 44 y.o. male who presents to [facility] ED with the presenting problem of suicidal ideations and excessive alcohol consumption.  He reports that he had been drinking heavily and just recently spoke with ex wife and stressed him to drink more.  Patient is well known to [facility] and has hx of binge drinking in order to cope with his depression. He reports that he has been experiencing severe suicidal ideations for the past few weeks without any known trigger. His blood alcohol level on admission was 48.  Per review of epic the patient was discharged from the [facility] unit a month ago for same presentation.  He had availed of [facility] services in the past.  He reports that he felt better on Seroquel versus Geodon.   Elements:  Location:  Alcohol intoxication, suicidal thoughts. Quality:  alcohol abuse  impairing compliance with medications. Severity:  Severe. Timing:  Last few days . Duration:  Chronic. Context:  History of chronic mental illness and substance abuse. Associated Signs/Symptoms: Depression Symptoms:  depressed mood, difficulty concentrating, anxiety, (Hypo) Manic Symptoms:  Impulsivity, Anxiety Symptoms:  Excessive Worry, Psychotic Symptoms:  auditory hallucinations but stable with medications at this time PTSD Symptoms: Denies Total Time spent with patient: 1 hour  Past Medical History:  Past Medical History  Diagnosis Date  . Psychosis   . PTSD (post-traumatic stress disorder)   . Seizure disorder ([place])     related to etoh seizure  . Alcohol abuse   . Schizoaffective disorder   . Anxiety     at age 7  . Depression     at age 65    Past Surgical History  Procedure Laterality Date  . Foot surgery      right  . Hand surgery     Family History:  Family History  Problem Relation Age of Onset  . Alcoholism Mother   . CAD Father   . Depression Brother    Social History:  History  Alcohol Use  . Yes    Comment: "I'm a binge drinker"  I drink case of beer per night     History  Drug Use  . 1.00 per week  . Special: Marijuana    Comment: THC 2 to 3 times per month    Social History   Social History  . Marital Status: Divorced    Spouse Name: N/A  . Number of Children: N/A  . Years of Education: N/A   Social History Main Topics  .  Smoking status: Current Every Day Smoker -- 1.00 packs/day for 24 years    Types: Cigarettes  . Smokeless tobacco: Never Used  . Alcohol Use: Yes     Comment: "I'm a binge drinker"  I drink case of beer per night  . Drug Use: 1.00 per week    Special: Marijuana     Comment: THC 2 to 3 times per month  . Sexual Activity: Yes    Birth Control/ Protection: Condom   Other Topics Concern  . None   Social History Narrative   Additional Social History:    Pain Medications: See PTA Prescriptions: See  PTA Over the Counter: See PTA History of alcohol / drug use?: Yes Longest period of sobriety (when/how long): 3.5 years Negative Consequences of Use: Legal, Personal relationships Withdrawal Symptoms: Sweats, Tremors, Other (Comment) Date of most recent seizure: Several years ago Name of Substance 1: Alcohol 1 - Age of First Use: 14 1 - Amount (size/oz): case of beer 1 - Frequency: daily 1 - Duration: ongoing 1 - Last Use / Amount: Fridat night- case of beer Name of Substance 2: THC 2 - Age of First Use: 15 2 - Amount (size/oz): "a couple grams" 2 - Frequency: 1-2x/weekly 2 - Duration: ongoing 2 - Last Use / Amount: 2 days ago                 Musculoskeletal: Strength & Muscle Tone: within normal limits Gait & Station: normal Patient leans: N/A  Psychiatric Specialty Exam: Physical Exam  Psychiatric: He has a normal mood and affect. His speech is normal and behavior is normal. Thought content normal. Cognition and memory are normal. He expresses impulsivity.    Review of Systems  Constitutional: Positive for chills and diaphoresis.  HENT: Negative.   Eyes: Negative.   Respiratory: Negative.   Cardiovascular: Negative.   Gastrointestinal: Negative.   Genitourinary: Negative.   Skin: Negative.   Neurological: Positive for tremors.  Endo/Heme/Allergies: Negative.   Psychiatric/Behavioral: Positive for depression (Denies), suicidal ideas (Denies), hallucinations (Denies) and substance abuse. Negative for memory loss (Denies). The patient is nervous/anxious (Denies) and has insomnia (Denies).   All other systems reviewed and are negative.   Blood pressure 129/83, pulse 102, temperature 98.7 F (37.1 C), temperature source Oral, resp. rate 18, height 5' 10"  (1.778 m), weight 97.523 kg (215 lb), SpO2 96 %.Body mass index is 30.85 kg/(m^2).  General Appearance: Disheveled  Eye Contact::  Good  Speech:  Clear and Coherent and Normal Rate  Volume:  Normal  Mood:   Dysphoric and Worthless  Affect:  Constricted  Thought Process:  Circumstantial, Coherent and Goal Directed  Orientation:  Full (Time, Place, and Person)  Thought Content:  Hallucinations: Auditory Visual  Suicidal Thoughts:  Yes.  without intent/plan  Homicidal Thoughts:  No  Memory:  Immediate;   Good Recent;   Good Remote;   Good  Judgement:  Fair  Insight:  Present  Psychomotor Activity:  Normal  Concentration:  Fair  Recall:  Good  Fund of Knowledge:Good  Language: Good  Akathisia:  No  Handed:  Right  AIMS (if indicated):     Assets:  Communication Skills Desire for Improvement Housing Physical Health Social Support  ADL's:  Intact  Cognition: WNL  Sleep:   fair   Risk to Self:   Risk to Others:   Prior Inpatient Therapy:   Prior Outpatient Therapy:    Alcohol Screening: Patient refused Alcohol Screening Tool: Yes 1. How often  do you have a drink containing alcohol?: 2 to 3 times a week 2. How many drinks containing alcohol do you have on a typical day when you are drinking?: 10 or more 3. How often do you have six or more drinks on one occasion?: Monthly Preliminary Score: 6 4. How often during the last year have you found that you were not able to stop drinking once you had started?: Monthly 6. How often during the last year have you needed a first drink in the morning to get yourself going after a heavy drinking session?: Monthly 7. How often during the last year have you had a feeling of guilt of remorse after drinking?: Monthly 8. How often during the last year have you been unable to remember what happened the night before because you had been drinking?: Monthly 9. Have you or someone else been injured as a result of your drinking?: Yes, during the last year 10. Has a relative or friend or a doctor or another health worker been concerned about your drinking or suggested you cut down?: Yes, but not in the last year Alcohol Use Disorder Identification Test  Final Score (AUDIT): 23 Brief Intervention: Yes  Allergies:   Allergies  Allergen Reactions  . Wellbutrin [Bupropion] Other (See Comments)    Makes me feel like I'm have a seizure   Lab Results:  Results for orders placed or performed during the hospital encounter of 06/16/15 (from the past 48 hour(s))  Comprehensive metabolic panel     Status: Abnormal   Collection Time: 06/16/15  3:30 PM  Result Value Ref Range   Sodium 138 135 - 145 mmol/L   Potassium 3.7 3.5 - 5.1 mmol/L   Chloride 103 101 - 111 mmol/L   CO2 22 22 - 32 mmol/L   Glucose, Bld 87 65 - 99 mg/dL   BUN 8 6 - 20 mg/dL   Creatinine, Ser 0.81 0.61 - 1.24 mg/dL   Calcium 9.5 8.9 - 10.3 mg/dL   Total Protein 7.9 6.5 - 8.1 g/dL   Albumin 4.9 3.5 - 5.0 g/dL   AST 30 15 - 41 U/L   ALT 52 17 - 63 U/L   Alkaline Phosphatase 82 38 - 126 U/L   Total Bilirubin 0.2 (L) 0.3 - 1.2 mg/dL   GFR calc non Af Amer >60 >60 mL/min   GFR calc Af Amer >60 >60 mL/min    Comment: (NOTE) The eGFR has been calculated using the CKD EPI equation. This calculation has not been validated in all clinical situations. eGFR's persistently <60 mL/min signify possible Chronic Kidney Disease.    Anion gap 13 5 - 15  Ethanol (ETOH)     Status: Abnormal   Collection Time: 06/16/15  3:30 PM  Result Value Ref Range   Alcohol, Ethyl (B) 43 (H) <5 mg/dL    Comment:        LOWEST DETECTABLE LIMIT FOR SERUM ALCOHOL IS 5 mg/dL FOR MEDICAL PURPOSES ONLY   Salicylate level     Status: None   Collection Time: 06/16/15  3:30 PM  Result Value Ref Range   Salicylate Lvl <6.2 2.8 - 30.0 mg/dL  Acetaminophen level     Status: Abnormal   Collection Time: 06/16/15  3:30 PM  Result Value Ref Range   Acetaminophen (Tylenol), Serum <10 (L) 10 - 30 ug/mL    Comment:        THERAPEUTIC CONCENTRATIONS VARY SIGNIFICANTLY. A RANGE OF 10-30 ug/mL MAY BE AN EFFECTIVE CONCENTRATION  FOR MANY PATIENTS. HOWEVER, SOME ARE BEST TREATED AT CONCENTRATIONS OUTSIDE  THIS RANGE. ACETAMINOPHEN CONCENTRATIONS >150 ug/mL AT 4 HOURS AFTER INGESTION AND >50 ug/mL AT 12 HOURS AFTER INGESTION ARE OFTEN ASSOCIATED WITH TOXIC REACTIONS.   CBC     Status: None   Collection Time: 06/16/15  3:30 PM  Result Value Ref Range   WBC 7.2 4.0 - 10.5 K/uL   RBC 5.03 4.22 - 5.81 MIL/uL   Hemoglobin 16.3 13.0 - 17.0 g/dL   HCT 46.6 39.0 - 52.0 %   MCV 92.6 78.0 - 100.0 fL   MCH 32.4 26.0 - 34.0 pg   MCHC 35.0 30.0 - 36.0 g/dL   RDW 13.4 11.5 - 15.5 %   Platelets 293 150 - 400 K/uL  Urine rapid drug screen (hosp performed) (Not at [facility])     Status: Abnormal   Collection Time: 06/16/15  3:40 PM  Result Value Ref Range   Opiates NONE DETECTED NONE DETECTED   Cocaine NONE DETECTED NONE DETECTED   Benzodiazepines NONE DETECTED NONE DETECTED   Amphetamines NONE DETECTED NONE DETECTED   Tetrahydrocannabinol POSITIVE (A) NONE DETECTED   Barbiturates NONE DETECTED NONE DETECTED    Comment:        DRUG SCREEN FOR MEDICAL PURPOSES ONLY.  IF CONFIRMATION IS NEEDED FOR ANY PURPOSE, NOTIFY LAB WITHIN 5 DAYS.        LOWEST DETECTABLE LIMITS FOR URINE DRUG SCREEN Drug Class       Cutoff (ng/mL) Amphetamine      1000 Barbiturate      200 Benzodiazepine   607 Tricyclics       371 Opiates          300 Cocaine          300 THC              50    Current Medications: Current Facility-Administered Medications  Medication Dose Route Frequency Provider Last Rate Last Dose  . acetaminophen (TYLENOL) tablet 650 mg  650 mg Oral Q4H PRN Lurena Nida, NP      . allopurinol (ZYLOPRIM) tablet 100 mg  100 mg Oral Daily Kerrie Buffalo, NP      . alum & mag hydroxide-simeth (MAALOX/MYLANTA) 200-200-20 MG/5ML suspension 30 mL  30 mL Oral PRN Lurena Nida, NP      . chlordiazePOXIDE (LIBRIUM) capsule 25 mg  25 mg Oral Q6H PRN Kerrie Buffalo, NP      . chlordiazePOXIDE (LIBRIUM) capsule 25 mg  25 mg Oral QID Kerrie Buffalo, NP       Followed by  . [START ON 06/19/2015]  chlordiazePOXIDE (LIBRIUM) capsule 25 mg  25 mg Oral TID Kerrie Buffalo, NP       Followed by  . [START ON 06/20/2015] chlordiazePOXIDE (LIBRIUM) capsule 25 mg  25 mg Oral BH-qamhs Kerrie Buffalo, NP       Followed by  . [START ON 06/21/2015] chlordiazePOXIDE (LIBRIUM) capsule 25 mg  25 mg Oral Daily Kerrie Buffalo, NP      . famotidine (PEPCID) tablet 20 mg  20 mg Oral BID Kerrie Buffalo, NP      . gabapentin (NEURONTIN) capsule 400 mg  400 mg Oral QID Kerrie Buffalo, NP      . hydrOXYzine (ATARAX/VISTARIL) tablet 25 mg  25 mg Oral Q6H PRN Kerrie Buffalo, NP      . ibuprofen (ADVIL,MOTRIN) tablet 600 mg  600 mg Oral Q8H PRN Lurena Nida, NP      .  loperamide (IMODIUM) capsule 2-4 mg  2-4 mg Oral PRN Kerrie Buffalo, NP      . multivitamin with minerals tablet 1 tablet  1 tablet Oral Daily Kerrie Buffalo, NP      . nicotine (NICODERM CQ - dosed in mg/24 hours) patch 21 mg  21 mg Transdermal Daily Kerrie Buffalo, NP      . ondansetron (ZOFRAN-ODT) disintegrating tablet 4 mg  4 mg Oral Q6H PRN Kerrie Buffalo, NP      . sertraline (ZOLOFT) tablet 75 mg  75 mg Oral Daily Kerrie Buffalo, NP      . ziprasidone (GEODON) capsule 60 mg  60 mg Oral BID WC Kerrie Buffalo, NP       PTA Medications: Prescriptions prior to admission  Medication Sig Dispense Refill Last Dose  . acamprosate (CAMPRAL) 333 MG tablet Take 2 tablets (666 mg total) by mouth 3 (three) times daily with meals. 180 tablet 0 1 month  . allopurinol (ZYLOPRIM) 100 MG tablet Take 1 tablet (100 mg total) by mouth daily. 30 tablet 3 06/15/2015 at Unknown time  . famotidine (PEPCID) 20 MG tablet Take 1 tablet (20 mg total) by mouth 2 (two) times daily. (Patient taking differently: Take 20 mg by mouth 2 (two) times daily as needed for heartburn or indigestion (for acid reflux). ) 30 tablet 0 2 weeks  . gabapentin (NEURONTIN) 400 MG capsule Take 1 capsule (400 mg total) by mouth 4 (four) times daily. 120 capsule 0 06/15/2015 at Unknown time  .  nicotine (NICODERM CQ - DOSED IN MG/24 HOURS) 21 mg/24hr patch Place 1 patch (21 mg total) onto the skin daily. (Patient not taking: Reported on 06/04/2015) 28 patch 0 Not Taking at Unknown time  . sertraline (ZOLOFT) 25 MG tablet Take 3 tablets (75 mg total) by mouth daily. 90 tablet 0 06/16/2015 at Unknown time  . ziprasidone (GEODON) 60 MG capsule Take 1 capsule (60 mg total) by mouth 2 (two) times daily with a meal. 60 capsule 0 06/15/2015 at Unknown time    Previous Psychotropic Medications: Yes   Substance Abuse History in the last 12 months:  Yes.    Consequences of Substance Abuse: Withdrawal Symptoms:   Cramps Diarrhea Headaches Tremors  Results for orders placed or performed during the hospital encounter of 06/16/15 (from the past 72 hour(s))  Comprehensive metabolic panel     Status: Abnormal   Collection Time: 06/16/15  3:30 PM  Result Value Ref Range   Sodium 138 135 - 145 mmol/L   Potassium 3.7 3.5 - 5.1 mmol/L   Chloride 103 101 - 111 mmol/L   CO2 22 22 - 32 mmol/L   Glucose, Bld 87 65 - 99 mg/dL   BUN 8 6 - 20 mg/dL   Creatinine, Ser 0.81 0.61 - 1.24 mg/dL   Calcium 9.5 8.9 - 10.3 mg/dL   Total Protein 7.9 6.5 - 8.1 g/dL   Albumin 4.9 3.5 - 5.0 g/dL   AST 30 15 - 41 U/L   ALT 52 17 - 63 U/L   Alkaline Phosphatase 82 38 - 126 U/L   Total Bilirubin 0.2 (L) 0.3 - 1.2 mg/dL   GFR calc non Af Amer >60 >60 mL/min   GFR calc Af Amer >60 >60 mL/min    Comment: (NOTE) The eGFR has been calculated using the CKD EPI equation. This calculation has not been validated in all clinical situations. eGFR's persistently <60 mL/min signify possible Chronic Kidney Disease.    Anion gap 13  5 - 15  Ethanol (ETOH)     Status: Abnormal   Collection Time: 06/16/15  3:30 PM  Result Value Ref Range   Alcohol, Ethyl (B) 43 (H) <5 mg/dL    Comment:        LOWEST DETECTABLE LIMIT FOR SERUM ALCOHOL IS 5 mg/dL FOR MEDICAL PURPOSES ONLY   Salicylate level     Status: None    Collection Time: 06/16/15  3:30 PM  Result Value Ref Range   Salicylate Lvl <3.3 2.8 - 30.0 mg/dL  Acetaminophen level     Status: Abnormal   Collection Time: 06/16/15  3:30 PM  Result Value Ref Range   Acetaminophen (Tylenol), Serum <10 (L) 10 - 30 ug/mL    Comment:        THERAPEUTIC CONCENTRATIONS VARY SIGNIFICANTLY. A RANGE OF 10-30 ug/mL MAY BE AN EFFECTIVE CONCENTRATION FOR MANY PATIENTS. HOWEVER, SOME ARE BEST TREATED AT CONCENTRATIONS OUTSIDE THIS RANGE. ACETAMINOPHEN CONCENTRATIONS >150 ug/mL AT 4 HOURS AFTER INGESTION AND >50 ug/mL AT 12 HOURS AFTER INGESTION ARE OFTEN ASSOCIATED WITH TOXIC REACTIONS.   CBC     Status: None   Collection Time: 06/16/15  3:30 PM  Result Value Ref Range   WBC 7.2 4.0 - 10.5 K/uL   RBC 5.03 4.22 - 5.81 MIL/uL   Hemoglobin 16.3 13.0 - 17.0 g/dL   HCT 46.6 39.0 - 52.0 %   MCV 92.6 78.0 - 100.0 fL   MCH 32.4 26.0 - 34.0 pg   MCHC 35.0 30.0 - 36.0 g/dL   RDW 13.4 11.5 - 15.5 %   Platelets 293 150 - 400 K/uL  Urine rapid drug screen (hosp performed) (Not at [facility])     Status: Abnormal   Collection Time: 06/16/15  3:40 PM  Result Value Ref Range   Opiates NONE DETECTED NONE DETECTED   Cocaine NONE DETECTED NONE DETECTED   Benzodiazepines NONE DETECTED NONE DETECTED   Amphetamines NONE DETECTED NONE DETECTED   Tetrahydrocannabinol POSITIVE (A) NONE DETECTED   Barbiturates NONE DETECTED NONE DETECTED    Comment:        DRUG SCREEN FOR MEDICAL PURPOSES ONLY.  IF CONFIRMATION IS NEEDED FOR ANY PURPOSE, NOTIFY LAB WITHIN 5 DAYS.        LOWEST DETECTABLE LIMITS FOR URINE DRUG SCREEN Drug Class       Cutoff (ng/mL) Amphetamine      1000 Barbiturate      200 Benzodiazepine   354 Tricyclics       562 Opiates          300 Cocaine          300 THC              50     Observation Level/Precautions:  15 minute checks  Laboratory:  CBC Chemistry Profile UDS  Psychotherapy:  Individual and group sessions  Medications:  Medications  started as appropriate for stabilization  Consultations:  Psychiatry  Discharge Concerns:  Safety, stabilization, and risk of access to medication and medication stabilization   Estimated LOS: 3-7 days  Other:     Psychological Evaluations: Yes   Treatment Plan Summary: Daily contact with patient to assess and evaluate symptoms and progress in treatment and Medication management Start Librium detox protocol due to active withdrawal symptoms Continue Geodon 40 mg bid for psychotic symptoms Continue Neurontin 400 mg TID for anxiety, mood control Continue Zoloft 75 mg daily for depression  Medical Decision Making:  Review of Psycho-Social Stressors (1), Review  or order clinical lab tests (1), Review and summation of old records (2), Review of Last Therapy Session (1), Independent Review of image, tracing or specimen (2) and Review of Medication Regimen & Side Effects (2)  Freda Munro May Agustin, AGNP-BC 06/17/2015 3:52 pm  I have examined the patient and agreed with the findings of H&P and treatment plan.  I also have done suicide assessment on this patient.

## 2015-06-17 NOTE — Progress Notes (Signed)
Patient did not attend the evening speaker AA meeting. Pt was notified that group was beginning but remained in bed.   

## 2015-06-17 NOTE — [facility] Suicide Risk Assessment (Signed)
[facility] Admission Suicide Risk Assessment   Nursing information obtained from:    Demographic factors:    Current Mental Status:    Loss Factors:    Historical Factors:    Risk Reduction Factors:    Total Time spent with patient: 1.5 hours Principal Problem: <principal problem not specified> Diagnosis:   Patient Active Problem List   Diagnosis Date Noted  . Schizoaffective disorder ([place]) [F25.9] 05/13/2015  . Alcohol-induced mood disorder ([place]) [F10.94] 05/05/2015  . Suicidal ideation [R45.851] 04/09/2015  . Suicide ideation [R45.851]   . Major depressive disorder, recurrent episode, mild ([place]) [F33.0]   . Alcohol dependence with alcohol-induced mood disorder ([place]) [F10.24]   . Alcohol use disorder, severe, dependence ([place]) [F10.20] 11/29/2014  . Current smoker [Z72.0] 11/20/2014  . Tremors [G25.2] 11/03/2014  . Noncompliance with medication regimen [Z91.14]   . Post traumatic stress disorder (PTSD) [F43.10] 11/03/2011     Continued Clinical Symptoms:    The "Alcohol Use Disorders Identification Test", Guidelines for Use in Primary Care, Second Edition.  [facility]). Score between 0-7:  no or low risk or alcohol related problems. Score between 8-15:  moderate risk of alcohol related problems. Score between 16-19:  high risk of alcohol related problems. Score 20 or above:  warrants further diagnostic evaluation for alcohol dependence and treatment.   CLINICAL FACTORS:   Alcohol/Substance Abuse/Dependencies Schizophrenia:   Depressive state Paranoid or undifferentiated type More than one psychiatric diagnosis Unstable or Poor Therapeutic Relationship Previous Psychiatric Diagnoses and Treatments   Musculoskeletal: Strength & Muscle Tone: within normal limits Gait & Station: normal Patient leans: no lean  Psychiatric Specialty Exam: Physical Exam  HENT:  Head: Normocephalic.  Skin: He is not diaphoretic.    Review of Systems  Constitutional:  Negative.   Neurological: Negative for tremors.  Psychiatric/Behavioral: Positive for depression and substance abuse. The patient is nervous/anxious.     Blood pressure 129/83, pulse 102, temperature 98.7 F (37.1 C), temperature source Oral, resp. rate 18, height 5\' 10"  (1.778 m), weight 97.523 kg (215 lb), SpO2 96 %.Body mass index is 30.85 kg/(m^2).  General Appearance: Casual  Eye Contact::  Fair  Speech:  Slow  Volume:  Decreased  Mood:  Dysphoric  Affect:  Congruent  Thought Process:  Coherent  Orientation:  Full (Time, Place, and Person)  Thought Content:  Paranoid Ideation and Rumination  Suicidal Thoughts:  No  Homicidal Thoughts:  No  Memory:  Immediate;   Fair Recent;   Fair  Judgement:  Poor  Insight:  Shallow  Psychomotor Activity:  Decreased  Concentration:  Fair  Recall:  [place]: Fair  Akathisia:  Negative  Handed:  Right  AIMS (if indicated):     Assets:  Desire for Improvement  Sleep:     Cognition: WNL  ADL's:  Intact     COGNITIVE FEATURES THAT CONTRIBUTE TO RISK:  Closed-mindedness and Polarized thinking    SUICIDE RISK:   Moderate:  Frequent suicidal ideation with limited intensity, and duration, some specificity in terms of plans, no associated intent, good self-control, limited dysphoria/symptomatology, some risk factors present, and identifiable protective factors, including available and accessible social support.  PLAN OF CARE: Admit for stabilization. Medication management and adjustment. Alcohol withdrawal precautions and safety.  Medical Decision Making:  Review of Psycho-Social Stressors (1), Decision to obtain old records (1), Review of Last Therapy Session (1) and Review of Medication Regimen & Side Effects (2)  I certify that inpatient  services furnished can reasonably be expected to improve the patient's condition.   Taniaya Rudder 06/17/2015, 12:03 PM

## 2015-06-17 NOTE — BH Assessment (Signed)
[facility] Assessment Progress Note   Clinician was informed that there was a male bed available on 300 hall.  [facility] Mechele Claude requested that patient be reviewed by extender for appropriateness for 300 hall or observation unit.  Clinician discussed patient care with Arlester Marker, NP who said that the inpatient unit would be more appropriate level of care for patient.  Mechele Claude said that patient can come after 08:00 to [facility] 306[facility]2.  Dr. Sabra Heck will be the attending.  Voluntary admission papers to be completed prior to arrival.

## 2015-06-17 NOTE — ED Notes (Addendum)
Pt is asleep and resting comfortably. 8:20a- Pt stated,"I am schizo affective and yes I do hear voices." "The voices tell me I am a piece of shit." Pt is shaking a little and did request some gingerale to drink stating , "I feel very thirsty." pt remains a 1:1 for SI. (9:30a)-Phoned adult unit  At [facility] to give report on the pt going to room 306-2. Nurse will phone back. [facility] nurse ,[name] phoned and requested if possible the pt come over at 11:45am. Phoned [facility] to pick the pt up at 11:30am. [name] phoned-report given and pt to be transported. 11:10a-Pt awakened and was shaking. He c/o sweats as well. Pt was medicated with 1mg  of ativan.Pt CIWA is a 4. [facility] nurse made aware.

## 2015-06-17 NOTE — Tx Team (Signed)
Initial Interdisciplinary Treatment Plan   PATIENT STRESSORS: Marital or family conflict Medication change or noncompliance Substance abuse   PATIENT STRENGTHS: Pt states that he was doing well in IOP ,wants help and would like to get back in that program. Ability for insight Average or above average intelligence General fund of knowledge   PROBLEM LIST: Problem List/Patient Goals Date to be addressed Date deferred Reason deferred Estimated date of resolution  Alt in mood- substance induced 06/17/2015     Alt in reality / perception 06/17/2015     Risk for self harm 06/17/2015                                          DISCHARGE CRITERIA:  Ability to meet basic life and health needs Improved stabilization in mood, thinking, and/or behavior Motivation to continue treatment in a less acute level of care Need for constant or close observation no longer present Reduction of life-threatening or endangering symptoms to within safe limits Verbal commitment to aftercare and medication compliance  PRELIMINARY DISCHARGE PLAN: Attend aftercare/continuing care group Outpatient therapy Return to previous living arrangement  PATIENT/FAMIILY INVOLVEMENT: This treatment plan has been presented to and reviewed with the patient, [name], and/or family member, .  The patient and family have been given the opportunity to ask questions and make suggestions.  [name] 06/17/2015, 12:26 PM
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
## 2015-06-18 DIAGNOSIS — F1094 Alcohol use, unspecified with alcohol-induced mood disorder: Secondary | ICD-10-CM

## 2015-06-18 NOTE — Progress Notes (Signed)
D: Patient was seen sleeping in his room. Responded when this writer called his name but did not open his eyes. Responds to any question with only yes or no answers. Patient later came during medication time. Complained of withdrawal symptoms: shakiness, anxiety, sweating. Denies pain, AH/VH at this time. Complained of mild headache. No behavioral issues noted.  A: Due/PRN medications given. Support and encouragement offered as needed. Every 15 minutes check for safety maintained. Will continue to monitor patient for safety and stability. R: Patient remains safe.

## 2015-06-18 NOTE — BHH Group Notes (Signed)
   [facility] LCSW Aftercare Discharge Planning Group Note  06/18/2015  8:45 AM   Participation Quality: Alert, Appropriate and Oriented  Mood/Affect: Depressed and flat  Depression Rating: 4-5  Anxiety Rating: 4-5  Thoughts of Suicide: Pt denies SI/HI  Will you contract for safety? Yes  Current AVH: Pt denies  Plan for Discharge/Comments: Pt attended discharge planning group and actively participated in group. CSW provided pt with today's workbook. Patient reports feeling "not bad" today. He hopes to return home soon to follow up with [facility] program.   Transportation Means: Pt reports access to transportation  Supports: No supports mentioned at this time  Tilden Fossa, MSW, [place] Social Worker [facility] [phone]

## 2015-06-18 NOTE — BHH Group Notes (Signed)
[place] LCSW Group Therapy 06/18/2015  1:15 pm  Type of Therapy: Group Therapy Participation Level: Active  Participation Quality: Attentive, Sharing and Supportive  Affect: Depressed   Cognitive: Alert and Oriented  Insight: Developing/Improving and Engaged  Engagement in Therapy: Developing/Improving and Engaged  Modes of Intervention: Clarification, Confrontation, Discussion, Education, Exploration,  Limit-setting, Orientation, Problem-solving, Rapport Building, Art therapist, Socialization and Support  Summary of Progress/Problems: Pt identified obstacles faced currently and processed barriers involved in overcoming these obstacles. Pt identified steps necessary for overcoming these obstacles and explored motivation (internal and external) for facing these difficulties head on. Pt further identified one area of concern in their lives and chose a goal to focus on for today. Patient identified his primary obstacle as using alcohol to cope with triggers. Patient recognizes that it is an unhealthy coping skill and that he would like to start calling his supports when feeling overwhelmed instead of drinking. CSW and other group members provided patient with emotional support and encouragement.  Tilden Fossa, MSW, [place] Worker [facility] [phone]

## 2015-06-18 NOTE — Tx Team (Addendum)
Interdisciplinary Treatment Plan Update (Adult) Date: 06/18/2015    Time Reviewed: 9:30 AM  Progress in Treatment: Attending groups: Continuing to assess, patient new to milieu Participating in groups: Continuing to assess, patient new to milieu Taking medication as prescribed: Yes Tolerating medication: Yes Family/Significant other contact made: No, CSW assessing for appropriate contacts Patient understands diagnosis: Yes Discussing patient identified problems/goals with staff: Yes Medical problems stabilized or resolved: Yes Denies suicidal/homicidal ideation: Yes Issues/concerns per patient self-inventory: Yes Other:  New problem(s) identified: N/A  Discharge Plan or Barriers: Patient plans to return home to follow up with outpatient services  Reason for Continuation of Hospitalization:  Depression Anxiety Medication Stabilization   Comments: N/A  Estimated length of stay: 3-5 days   Patient is a 44 year old male admitted for schizoaffective disorder and ETOH abuse. Patient lives in [place] alone and follows up with [facility]. Patient will benefit from crisis stabilization, medication evaluation, group therapy, and psycho education in addition to case management for discharge planning. Patient and CSW reviewed pt's identified goals and treatment plan. Pt verbalized understanding and agreed to treatment plan.     Review of initial/current patient goals per problem list:  1. Goal(s): Patient will participate in aftercare plan   Met: Yes   Target date: 3-5 days post admission date   As evidenced by: Patient will participate within aftercare plan AEB aftercare provider and housing plan at discharge being identified.  10/31: Goal met: Patient plans to return home to follow up with outpatient services.     2. Goal (s): Patient will exhibit decreased depressive symptoms and suicidal ideations.   Met: Adequate for discharge per MD   Target date: 3-5 days post  admission date   As evidenced by: Patient will utilize self rating of depression at 3 or below and demonstrate decreased signs of depression or be deemed stable for discharge by MD.  10/31: Patient rates depression at 4-5 today, denies SI.  11/1: Adequate for discharge per MD. Patient denies SI and reports feeling safe for discharge today.     3. Goal(s): Patient will demonstrate decreased signs and symptoms of anxiety.   Met: Adequate for discharge per MD   Target date: 3-5 days post admission date   As evidenced by: Patient will utilize self rating of anxiety at 3 or below and demonstrated decreased signs of anxiety, or be deemed stable for discharge by MD  10/31: Patient rates anxiety at 4-5 today.   11/1: Adequate for discharge per MD. Patient reports feeling safe for discharge today.     4. Goal(s): Patient will demonstrate decreased signs of withdrawal due to substance abuse   Met: Yes   Target date: 3-5 days post admission date   As evidenced by: Patient will produce a CIWA/COWS score of 0, have stable vitals signs, and no symptoms of withdrawal  10/31: Goal met: No withdrawal symptoms reported at this time per medical chart.      5. Goal(s): Patient will demonstrate decreased signs of psychosis  * Met: Adequate for discharge per MD  * Target date: 3-5 days post admission date  * As evidenced by: Patient will demonstrate decreased frequency of AVH or return to baseline function  10/31: Goal not met: Pt to take medication as prescribed to decrease psychosis to baseline.   11/1: Adequate for discharge per MD. Patient reports being at baseline and feeling safe for discharge.     Attendees: Patient:    Family:    Physician: Dr.  Cobos; Dr. Sabra Heck 06/18/2015 9:30 AM  Nursing: Desma Paganini, Grayland Ormond, Elinor Dodge, RN 06/18/2015 9:30 AM  Clinical Social Worker: Tilden Fossa, [place] 06/18/2015 9:30 AM  Other: Louie Bun  Smart LCSWA  06/18/2015 9:30 AM  Other: Lucinda Dell, [place] Sessions Liaison 06/18/2015 9:30 AM  Other: Lars Pinks, Case Manager 06/18/2015 9:30 AM  Other: Ricky Ala, NP 06/18/2015 9:30 AM  Other:            Scribe for Treatment Team:  Tilden Fossa, MSW, [place] [phone]

## 2015-06-18 NOTE — Progress Notes (Signed)
Patient ID: [name], male   DOB: 1970/12/14, 44 y.o.   MRN: [id number] PER STATE REGULATIONS 482.30  THIS CHART WAS REVIEWED FOR MEDICAL NECESSITY WITH RESPECT TO THE PATIENT'S ADMISSION/DURATION OF STAY.  NEXT REVIEW DATE:06/21/15  Roma Schanz, RN, BSN CASE MANAGER
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
## 2015-06-18 NOTE — Progress Notes (Signed)
[facility] MD Progress Note  06/18/2015 5:38 PM [name]  MRN:  [id number] Subjective:  [name] states that his wife called him and told him she loved him. He state he started thinking about the possibility of getting back together and this created a lot of anxiety what triggered him drinking and after that his taking a bunch of pills. States he was "drunk" when he took the pills. He has done well since he was here last time. Goes to [facility].  Principal Problem: Alcohol-induced mood disorder ([place]) Diagnosis:   Patient Active Problem List   Diagnosis Date Noted  . Alcohol dependence with alcohol-induced mood disorder ([place]) [F10.24]     Priority: High  . Schizoaffective disorder, depressive type ([place]) [F25.1]   . Alcohol use ([place]) [Z78.9]   . Schizoaffective disorder ([place]) [F25.9] 05/13/2015  . Alcohol-induced mood disorder ([place]) [F10.94] 05/05/2015  . Suicidal ideation [R45.851] 04/09/2015  . Suicide ideation [R45.851]   . Major depressive disorder, recurrent episode, mild ([place]) [F33.0]   . Alcohol use disorder, severe, dependence ([place]) [F10.20] 11/29/2014  . Current smoker [Z72.0] 11/20/2014  . Tremors [G25.2] 11/03/2014  . Noncompliance with medication regimen [Z91.14]   . Post traumatic stress disorder (PTSD) [F43.10] 11/03/2011   Total Time spent with patient: 30 minutes  Past Psychiatric History: see H and P  Past Medical History:  Past Medical History  Diagnosis Date  . Psychosis   . PTSD (post-traumatic stress disorder)   . Seizure disorder ([place])     related to etoh seizure  . Alcohol abuse   . Schizoaffective disorder   . Anxiety     at age 44  . Depression     at age 44    Past Surgical History  Procedure Laterality Date  . Foot surgery      right  . Hand surgery     Family History:  Family History  Problem Relation Age of Onset  . Alcoholism Mother   . CAD Father   . Depression Brother    Family Psychiatric  History: see admission H and P Social  History:  History  Alcohol Use  . Yes    Comment: "I'm a binge drinker"  I drink case of beer per night     History  Drug Use  . 1.00 per week  . Special: Marijuana    Comment: THC 2 to 3 times per month    Social History   Social History  . Marital Status: Divorced    Spouse Name: N/A  . Number of Children: N/A  . Years of Education: N/A   Social History Main Topics  . Smoking status: Current Every Day Smoker -- 1.00 packs/day for 24 years    Types: Cigarettes  . Smokeless tobacco: Never Used  . Alcohol Use: Yes     Comment: "I'm a binge drinker"  I drink case of beer per night  . Drug Use: 1.00 per week    Special: Marijuana     Comment: THC 2 to 3 times per month  . Sexual Activity: Yes    Birth Control/ Protection: Condom   Other Topics Concern  . None   Social History Narrative   Additional Social History:    Pain Medications: See PTA Prescriptions: See PTA Over the Counter: See PTA History of alcohol / drug use?: Yes Longest period of sobriety (when/how long): 3.5 years Negative Consequences of Use: Legal, Personal relationships Withdrawal Symptoms: Sweats, Tremors, Other (Comment) Date of most  recent seizure: Several years ago Name of Substance 1: Alcohol 1 - Age of First Use: 14 1 - Amount (size/oz): case of beer 1 - Frequency: daily 1 - Duration: ongoing 1 - Last Use / Amount: Fridat night- case of beer Name of Substance 2: THC 2 - Age of First Use: 15 2 - Amount (size/oz): "a couple grams" 2 - Frequency: 1-2x/weekly 2 - Duration: ongoing 2 - Last Use / Amount: 2 days ago                Sleep: Fair  Appetite:  Fair  Current Medications: Current Facility-Administered Medications  Medication Dose Route Frequency Provider Last Rate Last Dose  . acetaminophen (TYLENOL) tablet 650 mg  650 mg Oral Q4H PRN Lurena Nida, NP      . allopurinol (ZYLOPRIM) tablet 100 mg  100 mg Oral Daily Kerrie Buffalo, NP   100 mg at 06/18/15 0813  . alum &  mag hydroxide-simeth (MAALOX/MYLANTA) 200-200-20 MG/5ML suspension 30 mL  30 mL Oral PRN Lurena Nida, NP      . chlordiazePOXIDE (LIBRIUM) capsule 25 mg  25 mg Oral Q6H PRN Kerrie Buffalo, NP      . chlordiazePOXIDE (LIBRIUM) capsule 25 mg  25 mg Oral QID Kerrie Buffalo, NP   25 mg at 06/18/15 1204   Followed by  . [START ON 06/19/2015] chlordiazePOXIDE (LIBRIUM) capsule 25 mg  25 mg Oral TID Kerrie Buffalo, NP       Followed by  . [START ON 06/20/2015] chlordiazePOXIDE (LIBRIUM) capsule 25 mg  25 mg Oral BH-qamhs Kerrie Buffalo, NP       Followed by  . [START ON 06/21/2015] chlordiazePOXIDE (LIBRIUM) capsule 25 mg  25 mg Oral Daily Kerrie Buffalo, NP      . famotidine (PEPCID) tablet 20 mg  20 mg Oral BID Kerrie Buffalo, NP   20 mg at 06/18/15 0813  . gabapentin (NEURONTIN) capsule 400 mg  400 mg Oral QID Kerrie Buffalo, NP   400 mg at 06/18/15 1205  . hydrOXYzine (ATARAX/VISTARIL) tablet 25 mg  25 mg Oral Q6H PRN Kerrie Buffalo, NP      . ibuprofen (ADVIL,MOTRIN) tablet 600 mg  600 mg Oral Q8H PRN Lurena Nida, NP      . loperamide (IMODIUM) capsule 2-4 mg  2-4 mg Oral PRN Kerrie Buffalo, NP      . multivitamin with minerals tablet 1 tablet  1 tablet Oral Daily Kerrie Buffalo, NP   1 tablet at 06/18/15 0813  . nicotine (NICODERM CQ - dosed in mg/24 hours) patch 21 mg  21 mg Transdermal Daily Kerrie Buffalo, NP   21 mg at 06/18/15 0819  . ondansetron (ZOFRAN-ODT) disintegrating tablet 4 mg  4 mg Oral Q6H PRN Kerrie Buffalo, NP      . sertraline (ZOLOFT) tablet 75 mg  75 mg Oral Daily Kerrie Buffalo, NP   75 mg at 06/18/15 0814  . ziprasidone (GEODON) capsule 60 mg  60 mg Oral BID WC Kerrie Buffalo, NP   60 mg at 06/18/15 0813    Lab Results: No results found for this or any previous visit (from the past 48 hour(s)).  Physical Findings: AIMS: Facial and Oral Movements Muscles of Facial Expression: None, normal Lips and Perioral Area: None, normal Jaw: None, normal Tongue: None,  normal,Extremity Movements Upper (arms, wrists, hands, fingers): None, normal Lower (legs, knees, ankles, toes): None, normal, Trunk Movements Neck, shoulders, hips: None, normal, Overall Severity Severity of abnormal movements (  highest score from questions above): None, normal Incapacitation due to abnormal movements: None, normal Patient's awareness of abnormal movements (rate only patient's report): No Awareness, Dental Status Current problems with teeth and/or dentures?: No Does patient usually wear dentures?: No  CIWA:    COWS:     Musculoskeletal: Strength & Muscle Tone: within normal limits Gait & Station: normal Patient leans: normal  Psychiatric Specialty Exam: Review of Systems  Constitutional: Negative.   HENT: Negative.   Eyes: Negative.   Respiratory: Negative.   Cardiovascular: Negative.   Gastrointestinal: Negative.   Genitourinary: Negative.   Musculoskeletal: Negative.   Skin: Negative.   Neurological: Negative.   Endo/Heme/Allergies: Negative.   Psychiatric/Behavioral: Positive for substance abuse. The patient is nervous/anxious.     Blood pressure 123/88, pulse 67, temperature 97.7 F (36.5 C), temperature source Oral, resp. rate 16, height 5\' 10"  (1.778 m), weight 97.523 kg (215 lb), SpO2 96 %.Body mass index is 30.85 kg/(m^2).  General Appearance: Fairly Groomed  Engineer, water::  Fair  Speech:  Clear and Coherent  Volume:  Normal  Mood:  Anxious  Affect:  Restricted  Thought Process:  Coherent and Goal Directed  Orientation:  Full (Time, Place, and Person)  Thought Content:  symptoms events worries concerns  Suicidal Thoughts:  No  Homicidal Thoughts:  No  Memory:  Immediate;   Fair Recent;   Fair Remote;   Fair  Judgement:  Fair  Insight:  Present  Psychomotor Activity:  Normal  Concentration:  Fair  Recall:  [employer] [place]  Language: Fair  Akathisia:  No  Handed:  Right  AIMS (if indicated):     Assets:  Desire for  Improvement Housing Social Support  ADL's:  Intact  Cognition: WNL  Sleep:  Number of Hours: 6.75   Treatment Plan Summary: Daily contact with patient to assess and evaluate symptoms and progress in treatment and Medication management  Supportive approach/coping skills Alcohol abuse; Librium detox protocol/ continue to work a relapse prevention plan Mood instability; continue to work with the Neurontin Depression; continue to work with the Zoloft Hallucinations; continue to work with the [place] Help process his feeling associated to his ex stating she loved him Work with CBT/mindfulness  [name] A 06/18/2015, 5:38 PM

## 2015-06-18 NOTE — Progress Notes (Signed)
Pt attended spiritual care group on grief and loss facilitated by chaplain Jerene Pitch and counseling intern Ramsey opened with brief discussion and psycho-social ed around grief and loss in relationships and in relation to self - identifying life patterns, circumstances, changes that cause losses. Established group norm of speaking from own life experience. Group goal of establishing open and affirming space for members to share loss and experience with grief, normalize grief experience and provide psycho social education and grief support.

## 2015-06-18 NOTE — Progress Notes (Signed)
NSG shift assessment. 7a-7p.   D: Pt's affect is blunted, and his mood is depressed, behavior appropriate. He signed a 72-Hour Request for release  because he wants to continue his treatment outpatient, IOP. Reported this to Dr. Sabra Heck during treatment team.   He rates his depression as 5/10, feelings of hopelessness are 5/10, and anxiety is 5/10. He reports some withdrawal symptoms and has tremors, and chills. He denies SI, HI. He is not delusional or psychotic.    A: Observed pt interacting in group and in the milieu: Support and encouragement offered. Safety maintained with observations every 15 minutes.   R:   Contracts for safety and continues to follow the treatment plan, working on learning new coping skills.

## 2015-06-19 MED ORDER — SERTRALINE HCL 25 MG PO TABS: 75.0000 mg | ORAL_TABLET | Freq: Every day | ORAL | Status: DC

## 2015-06-19 MED ORDER — ALLOPURINOL 100 MG PO TABS: 100.0000 mg | ORAL_TABLET | Freq: Every day | ORAL | Status: DC

## 2015-06-19 MED ORDER — FAMOTIDINE 20 MG PO TABS: 20.0000 mg | ORAL_TABLET | Freq: Two times a day (BID) | ORAL | Status: DC

## 2015-06-19 MED ORDER — GABAPENTIN 400 MG PO CAPS: 400.0000 mg | ORAL_CAPSULE | Freq: Three times a day (TID) | ORAL | Status: DC | PRN

## 2015-06-19 MED ORDER — ZIPRASIDONE HCL 60 MG PO CAPS: 60.0000 mg | ORAL_CAPSULE | Freq: Two times a day (BID) | ORAL | Status: DC

## 2015-06-19 NOTE — Discharge Summary (Signed)
Physician Discharge Summary Note  Patient:  [name] is an 44 y.o., male MRN:  [id number] DOB:  [birth year] Patient phone:  [phone] (home)  Patient address:   [address] [place] [address],  Total Time spent with patient: 30 minutes  Date of Admission:  06/17/2015 Date of Discharge: 06/19/2015  Reason for Admission:Per Admission Note:  [name] is an 44 y.o. male who presents voluntarily to the ED requesting assistance for withdrawals and hallucinations. Patient states that "I have been hallucinating really bad today and I want to die." Patient states that he hears voices that "berate" him and "sees shadow people." Patient states "I don't feel safe and the withdrawals are really bad. I know I'm going to hurt myself if I don't get some help." When asked how we could help him, patient states "I just want to lay down, this chair sucks." Patient states that he took "a handful" of Neurontin last night in an attempt to kill himself but was unsuccessful. Patient states that he has been diagnosed with Schizoaffective Disorder and hears voices and sees shadows. Patient states that he drinks about a case of beer daily and has been drinking since age 41. Patient states that he smokes THC one or two times per week ("a couple grams"). Patient denies other drug use. Patient states that he has been increasingly depressed but was unable to identify recent stressors and states "I have schizoaffective disorder." Patient states that he has attempted suicide four times in the past including last night. Patient states that he has burned himself in the past but denies burning himself in recent months. Patient denies engaging in other self-injurious behavior. Patient BAL 43 and UDS + THC.  Patient calm and cooperative. Patient alert and oriented and spoke logically and coherently. Patient is oriented to person, place, situation, and year but is not sure of the date. Patient made poor eye contact  and looked down at the floor for most of the assessment. Patient appears depressed and mood and affect congruent. Patient sat in the chair with his head down dressed in scrubs. Patient states that he has been to several inpatient facilities including, [facility], [facility], [facility], and [facility]. Patient states that he has been receiving outpatient treatment from [facility] for medication management for the past four years. Patient states that his appetite is "okay" and he sleeps about three hours per night. Patient states that he binge drinks due to depression and hallucinations.  Principal Problem: Alcohol-induced mood disorder [facility]) Discharge Diagnoses: Patient Active Problem List   Diagnosis Date Noted  . Schizoaffective disorder, depressive type ([place]) [F25.1]   . Alcohol use ([place]) [Z78.9]   . Schizoaffective disorder ([place]) [F25.9] 05/13/2015  . Alcohol-induced mood disorder ([place]) [F10.94] 05/05/2015  . Suicidal ideation [R45.851] 04/09/2015  . Suicide ideation [R45.851]   . Major depressive disorder, recurrent episode, mild ([place]) [F33.0]   . Alcohol dependence with alcohol-induced mood disorder ([place]) [F10.24]   . Alcohol use disorder, severe, dependence ([place]) [F10.20] 11/29/2014  . Current smoker [Z72.0] 11/20/2014  . Tremors [G25.2] 11/03/2014  . Noncompliance with medication regimen [Z91.14]   . Post traumatic stress disorder (PTSD) [F43.10] 11/03/2011    Musculoskeletal: Strength & Muscle Tone: within normal limits Gait & Station: normal Patient leans: N/A  Psychiatric Specialty Exam: See SRA Physical Exam  Nursing note and vitals reviewed. Constitutional: He is oriented to person, place, and time. He appears well-developed and well-nourished.  Neck: Normal range of motion.  Respiratory: Breath sounds normal.  Musculoskeletal: Normal range of motion.  Neurological: He is alert and oriented to person, place, and time.  Skin: Skin is warm and dry.    Review of Systems   Psychiatric/Behavioral: Negative for suicidal ideas. Depression: stable. Substance abuse: stable. Nervous/anxious: stable.   All other systems reviewed and are negative.   Blood pressure 119/78, pulse 94, temperature 97.8 F (36.6 C), temperature source Oral, resp. rate 15, height $RemoveBe'5\' 10"'[id number]$  (1.778 m), weight 97.523 kg (215 lb), SpO2 96 %.Body mass index is 30.85 kg/(m^2).  Have you used any form of tobacco in the last 30 days? (Cigarettes, Smokeless Tobacco, Cigars, and/or Pipes): Yes  Has this patient used any form of tobacco in the last 30 days? (Cigarettes, Smokeless Tobacco, Cigars, and/or Pipes) Yes, A prescription for an FDA-approved tobacco cessation medication was offered at discharge and the patient refused  Past Medical History:  Past Medical History  Diagnosis Date  . Psychosis   . PTSD (post-traumatic stress disorder)   . Seizure disorder ([place])     related to etoh seizure  . Alcohol abuse   . Schizoaffective disorder   . Anxiety     at age 89  . Depression     at age 54    Past Surgical History  Procedure Laterality Date  . Foot surgery      right  . Hand surgery     Family History:  Family History  Problem Relation Age of Onset  . Alcoholism Mother   . CAD Father   . Depression Brother    Social History:  History  Alcohol Use  . Yes    Comment: "I'm a binge drinker"  I drink case of beer per night     History  Drug Use  . 1.00 per week  . Special: Marijuana    Comment: THC 2 to 3 times per month    Social History   Social History  . Marital Status: Divorced    Spouse Name: N/A  . Number of Children: N/A  . Years of Education: N/A   Social History Main Topics  . Smoking status: Current Every Day Smoker -- 1.00 packs/day for 24 years    Types: Cigarettes  . Smokeless tobacco: Never Used  . Alcohol Use: Yes     Comment: "I'm a binge drinker"  I drink case of beer per night  . Drug Use: 1.00 per week    Special: Marijuana     Comment: THC 2 to 3  times per month  . Sexual Activity: Yes    Birth Control/ Protection: Condom   Other Topics Concern  . None   Social History Narrative    Risk to Self:  NO Risk to Others:  no Prior Inpatient Therapy:  yes Prior Outpatient Therapy:  yes  Level of Care:  OP  Hospital Course:  [name] was admitted for Alcohol-induced mood disorder ([place]) and crisis management. He was treated discharged with the medications listed below under Medication List.  Medical problems were identified and treated as needed.  Home medications were restarted as appropriate.  Improvement was monitored by observation and [name] daily report of symptom reduction.  Emotional and mental status was monitored by daily self-inventory reports completed by [name] and clinical staff.         [name] was evaluated by the treatment team for stability and plans for continued recovery upon discharge.  [name] motivation was an integral factor for scheduling further treatment.  Employment,  transportation, bed availability, health status, family support, and any pending legal issues were also considered during his hospital stay. He was offered further treatment options upon discharge including but not limited to Residential, Intensive Outpatient, and Outpatient treatment.  [name] will follow up with the services as listed below under Follow Up Information.     Upon completion of this admission the patient was both mentally and medically stable for discharge denying suicidal/homicidal ideation, auditory/visual/tactile hallucinations, delusional thoughts and paranoia.      Consults:  psychiatry  Significant Diagnostic Studies:  labs: USD positive THC, CMP; Total Bili, EtoH 43, Aceteminophen leve <10, CBC; WNL  Discharge Vitals:   Blood pressure 119/78, pulse 94, temperature 97.8 F (36.6 C), temperature source Oral, resp. rate 15, height _0  (1.778 m), weight 97.523 kg (215 lb),  SpO2 96 %. Body mass index is 30.85 kg/(m^2). Lab Results:   Results for orders placed or performed during the hospital encounter of 06/16/15 (from the past 72 hour(s))  Comprehensive metabolic panel     Status: Abnormal   Collection Time: 06/16/15  3:30 PM  Result Value Ref Range   Sodium 138 135 - 145 mmol/L   Potassium 3.7 3.5 - 5.1 mmol/L   Chloride 103 101 - 111 mmol/L   CO2 22 22 - 32 mmol/L   Glucose, Bld 87 65 - 99 mg/dL   BUN 8 6 - 20 mg/dL   Creatinine, Ser 0.81 0.61 - 1.24 mg/dL   Calcium 9.5 8.9 - 10.3 mg/dL   Total Protein 7.9 6.5 - 8.1 g/dL   Albumin 4.9 3.5 - 5.0 g/dL   AST 30 15 - 41 U/L   ALT 52 17 - 63 U/L   Alkaline Phosphatase 82 38 - 126 U/L   Total Bilirubin 0.2 (L) 0.3 - 1.2 mg/dL   GFR calc non Af Amer >60 >60 mL/min   GFR calc Af Amer >60 >60 mL/min    Comment: (NOTE) The eGFR has been calculated using the CKD EPI equation. This calculation has not been validated in all clinical situations. eGFR's persistently <60 mL/min signify possible Chronic Kidney Disease.    Anion gap 13 5 - 15  Ethanol (ETOH)     Status: Abnormal   Collection Time: 06/16/15  3:30 PM  Result Value Ref Range   Alcohol, Ethyl (B) 43 (H) <5 mg/dL    Comment:        LOWEST DETECTABLE LIMIT FOR SERUM ALCOHOL IS 5 mg/dL FOR MEDICAL PURPOSES ONLY   Salicylate level     Status: None   Collection Time: 06/16/15  3:30 PM  Result Value Ref Range   Salicylate Lvl <5.6 2.8 - 30.0 mg/dL  Acetaminophen level     Status: Abnormal   Collection Time: 06/16/15  3:30 PM  Result Value Ref Range   Acetaminophen (Tylenol), Serum <10 (L) 10 - 30 ug/mL    Comment:        THERAPEUTIC CONCENTRATIONS VARY SIGNIFICANTLY. A RANGE OF 10-30 ug/mL MAY BE AN EFFECTIVE CONCENTRATION FOR MANY PATIENTS. HOWEVER, SOME ARE BEST TREATED AT CONCENTRATIONS OUTSIDE THIS RANGE. ACETAMINOPHEN CONCENTRATIONS >150 ug/mL AT 4 HOURS AFTER INGESTION AND >50 ug/mL AT 12 HOURS AFTER INGESTION ARE OFTEN  ASSOCIATED WITH TOXIC REACTIONS.   CBC     Status: None   Collection Time: 06/16/15  3:30 PM  Result Value Ref Range   WBC 7.2 4.0 - 10.5 K/uL   RBC 5.03 4.22 - 5.81 MIL/uL   Hemoglobin 16.3 13.0 -  17.0 g/dL   HCT 46.6 39.0 - 52.0 %   MCV 92.6 78.0 - 100.0 fL   MCH 32.4 26.0 - 34.0 pg   MCHC 35.0 30.0 - 36.0 g/dL   RDW 13.4 11.5 - 15.5 %   Platelets 293 150 - 400 K/uL  Urine rapid drug screen (hosp performed) (Not at [facility])     Status: Abnormal   Collection Time: 06/16/15  3:40 PM  Result Value Ref Range   Opiates NONE DETECTED NONE DETECTED   Cocaine NONE DETECTED NONE DETECTED   Benzodiazepines NONE DETECTED NONE DETECTED   Amphetamines NONE DETECTED NONE DETECTED   Tetrahydrocannabinol POSITIVE (A) NONE DETECTED   Barbiturates NONE DETECTED NONE DETECTED    Comment:        DRUG SCREEN FOR MEDICAL PURPOSES ONLY.  IF CONFIRMATION IS NEEDED FOR ANY PURPOSE, NOTIFY LAB WITHIN 5 DAYS.        LOWEST DETECTABLE LIMITS FOR URINE DRUG SCREEN Drug Class       Cutoff (ng/mL) Amphetamine      1000 Barbiturate      200 Benzodiazepine   056 Tricyclics       979 Opiates          300 Cocaine          300 THC              50     Physical Findings: AIMS: Facial and Oral Movements Muscles of Facial Expression: None, normal Lips and Perioral Area: None, normal Jaw: None, normal Tongue: None, normal,Extremity Movements Upper (arms, wrists, hands, fingers): None, normal Lower (legs, knees, ankles, toes): None, normal, Trunk Movements Neck, shoulders, hips: None, normal, Overall Severity Severity of abnormal movements (highest score from questions above): None, normal Incapacitation due to abnormal movements: None, normal Patient's awareness of abnormal movements (rate only patient's report): No Awareness, Dental Status Current problems with teeth and/or dentures?: No Does patient usually wear dentures?: No  CIWA:    COWS:      See Psychiatric Specialty Exam and Suicide Risk  Assessment completed by Attending Physician prior to discharge.  Discharge destination:  Home  Is patient on multiple antipsychotic therapies at discharge:  No   Has Patient had three or more failed trials of antipsychotic monotherapy by history:  No   Recommended Plan for Multiple Antipsychotic Therapies: NA     Medication List    ASK your doctor about these medications      Indication   acamprosate 333 MG tablet  Commonly known as:  CAMPRAL  Take 2 tablets (666 mg total) by mouth 3 (three) times daily with meals.   Indication:  Excessive Use of Alcohol     allopurinol 100 MG tablet  Commonly known as:  ZYLOPRIM  Take 1 tablet (100 mg total) by mouth daily.   Indication:  gout     famotidine 20 MG tablet  Commonly known as:  PEPCID  Take 1 tablet (20 mg total) by mouth 2 (two) times daily.   Indication:  Gastroesophageal Reflux Disease     gabapentin 400 MG capsule  Commonly known as:  NEURONTIN  Take 1 capsule (400 mg total) by mouth 4 (four) times daily.   Indication:  Alcohol Withdrawal Syndrome, Neuropathic Pain     nicotine 21 mg/24hr patch  Commonly known as:  NICODERM CQ - dosed in mg/24 hours  Place 1 patch (21 mg total) onto the skin daily.   Indication:  Nicotine Addiction  sertraline 25 MG tablet  Commonly known as:  ZOLOFT  Take 3 tablets (75 mg total) by mouth daily.   Indication:  Major Depressive Disorder     ziprasidone 60 MG capsule  Commonly known as:  GEODON  Take 1 capsule (60 mg total) by mouth 2 (two) times daily with a meal.   Indication:  mood stabilization           Follow-up Information    [facility]   Specialty:  Behavioral Health   Why:  Needs appt   Contact information:   [address] [place] [address] [phone]       Follow-up recommendations:  Activity:  as tolerated Diet:  heart healthy  Comments:  Take all of you medications as prescribed by your mental  healthcare provider.  Report any adverse effects and reactions from your medications to your outpatient provider promptly. Do not engage in alcohol and or illegal drug use while on prescription medicines. In the event of worsening symptoms call the crisis hotline, 911, and or go to the nearest emergency department for appropriate evaluation and treatment of symptoms. Follow-up with your primary care provider for your medical issues, concerns and or health care needs.   Keep all scheduled appointments.  If you are unable to keep an appointment call to reschedule.  Let the nurse know if you will need medications before next scheduled appointment.  Total Discharge Time: 30 minutes   Signed: Derrill Center- FNP [facility] 06/19/2015, 8:59 AM  I personally assessed the patient and formulated the plan Geralyn Flash A. Sabra Heck, M.D.

## 2015-06-19 NOTE — BHH Suicide Risk Assessment (Signed)
[facility] Discharge Suicide Risk Assessment   Demographic Factors:  Male and Caucasian  Total Time spent with patient: 30 minutes  Musculoskeletal: Strength & Muscle Tone: within normal limits Gait & Station: normal Patient leans: normal  Psychiatric Specialty Exam: Physical Exam  Review of Systems  Constitutional: Negative.   HENT: Negative.   Eyes: Negative.   Respiratory: Negative.   Cardiovascular: Negative.   Gastrointestinal: Negative.   Genitourinary: Negative.   Musculoskeletal: Negative.   Skin: Negative.   Neurological: Negative.   Endo/Heme/Allergies: Negative.   Psychiatric/Behavioral: Positive for substance abuse.    Blood pressure 119/78, pulse 94, temperature 97.8 F (36.6 C), temperature source Oral, resp. rate 15, height 5\' 10"  (1.778 m), weight 97.523 kg (215 lb), SpO2 96 %.Body mass index is 30.85 kg/(m^2).  General Appearance: Fairly Groomed  Engineer, water::  Fair  Speech:  Clear and [id number]  Volume:  Normal  Mood:  Euthymic  Affect:  Appropriate  Thought Process:  Coherent and Goal Directed  Orientation:  Full (Time, Place, and Person)  Thought Content:  plans as he moves on, relapse prevention plan  Suicidal Thoughts:  No  Homicidal Thoughts:  No  Memory:  Immediate;   Fair Recent;   Fair Remote;   Fair  Judgement:  Fair  Insight:  Present  Psychomotor Activity:  Normal  Concentration:  Fair  Recall:  [employer] [place]  Language: Fair  Akathisia:  No  Handed:  Right  AIMS (if indicated):     Assets:  Desire for Improvement Housing Social Support  Sleep:  Number of Hours: 6.25  Cognition: WNL  ADL's:  Intact   Have you used any form of tobacco in the last 30 days? (Cigarettes, Smokeless Tobacco, Cigars, and/or Pipes): Yes  Has this patient used any form of tobacco in the last 30 days? (Cigarettes, Smokeless Tobacco, Cigars, and/or Pipes) Yes, A prescription for an FDA-approved tobacco cessation medication was offered at  discharge and the patient refused  Mental Status Per Nursing Assessment::   On Admission:  Self-harm thoughts  Current Mental Status by Physician: In full contact with reality. There are no active S/S of withdrawal. There are no active SI plans or intent. He is willing and motivated to pursue further outpatient work. He is planning to be at the [facility] tomorrow at 9 AM. He is looking forward to his mother coming in Nov 16   Loss Factors: NA  Historical Factors: NA  Risk Reduction Factors:   Sense of responsibility to family, Positive social support and Positive therapeutic relationship  Continued Clinical Symptoms:  Alcohol/Substance Abuse/Dependencies  Cognitive Features That Contribute To Risk:  None    Suicide Risk:  Minimal: No identifiable suicidal ideation.  Patients presenting with no risk factors but with morbid ruminations; may be classified as minimal risk based on the severity of the depressive symptoms  Principal Problem: Alcohol-induced mood disorder [facility]) Discharge Diagnoses:  Patient Active Problem List   Diagnosis Date Noted  . Alcohol dependence with alcohol-induced mood disorder ([place]) [F10.24]     Priority: High  . Schizoaffective disorder, depressive type ([place]) [F25.1]   . Alcohol use ([place]) [Z78.9]   . Schizoaffective disorder ([place]) [F25.9] 05/13/2015  . Alcohol-induced mood disorder ([place]) [F10.94] 05/05/2015  . Suicidal ideation [R45.851] 04/09/2015  . Suicide ideation [R45.851]   . Major depressive disorder, recurrent episode, mild ([place]) [F33.0]   . Alcohol use disorder, severe, dependence ([place]) [F10.20] 11/29/2014  . Current smoker [Z72.0] 11/20/2014  .  Tremors [G25.2] 11/03/2014  . Noncompliance with medication regimen [Z91.14]   . Post traumatic stress disorder (PTSD) [F43.10] 11/03/2011    Follow-up Information    [facility]   Specialty:  Behavioral Health   Why:  Needs appt   Contact information:   [address] [place] [address] [phone]       Plan Of Care/Follow-up recommendations:  Activity:  as tolerated Diet:  regular Follow up [facility] as above Is patient on multiple antipsychotic therapies at discharge:  No   Has Patient had three or more failed trials of antipsychotic monotherapy by history:  No  Recommended Plan for Multiple Antipsychotic Therapies: NA    Miguel Medal A 06/19/2015, 12:35 PM

## 2015-06-19 NOTE — Progress Notes (Signed)
Pt spent most of the evening sitting at the back of the dayroom watching TV with little to no interaction with his peers.  He reports that he has had a fairly good day.  He denies SI/HI/AVH.  He denies any withdrawal symptoms at this time.  He reports he has attended groups today.  He was encouraged to make his needs known to staff.  Discharge plans are in process.  Support and encouragement offered.  Safety maintained with q15 minute checks.

## 2015-06-19 NOTE — Progress Notes (Signed)
D: Patient verbalizes readiness for discharge: Denies SI/HI, is not psychotic or delusional.   A: Discharge instructions read and discussed with patient. All belongings returned to pt.   R: Pt verbalized understanding of discharge instructions. Signed for return of belongings.   A: Escorted to the lobby.    

## 2015-06-19 NOTE — Progress Notes (Signed)
Adult Psychoeducational Group Note  Date:  06/19/2015 Time:  9:45 AM  Group Topic/Focus:  Recovery Goals:   The focus of this group is to identify appropriate goals for recovery and establish a plan to achieve them.  Participation Level:  Active  Participation Quality:  Appropriate, Attentive and Supportive  Affect:  Appropriate  Cognitive:  Alert and Appropriate  Insight: Appropriate and Good  Engagement in Group:  Developing/Improving and Supportive  Modes of Intervention:  Discussion, Education, Socialization and Support  Additional Comments:  Pt is vested in finding recovery goals that work for him.  Johnnye Sima, Diane C 06/19/2015, 9:45 AM

## 2015-06-19 NOTE — Progress Notes (Signed)
  [facility] Adult Case Management Discharge Plan :  Will you be returning to the same living situation after discharge:  Yes, patient plans to return home At discharge, do you have transportation home?: Yes,  patient reports that he has his moped at the hospital Do you have the ability to pay for your medications: Yes,  patient will be provided with prescriptions at discharge  Release of information consent forms completed and in the chart;  Patient's signature needed at discharge.  Patient to Follow up at: Follow-up Information    [facility] with [facility]   Specialty:  Behavioral Health   Why:  Needs appt   Contact information:   [place] [address] [phone]       Patient denies SI/HI: Yes,  patient denies    Safety Planning and Suicide Prevention discussed: Yes,  with patient and mother  Have you used any form of tobacco in the last 30 days? (Cigarettes, Smokeless Tobacco, Cigars, and/or Pipes): Yes  Has patient been referred to the Quitline?: Patient refused referral  Darnell Jeschke, [name] 06/19/2015, 10:18 AM

## 2015-06-19 NOTE — BHH Suicide Risk Assessment (Signed)
[name] INPATIENT:  Family/Significant Other Suicide Prevention Education  Suicide Prevention Education:  Education Completed; mother [name] [phone],  (name of family member/significant other) has been identified by the patient as the family member/significant other with whom the patient will be residing, and identified as the person(s) who will aid the patient in the event of a mental health crisis (suicidal ideations/suicide attempt).  With written consent from the patient, the family member/significant other has been provided the following suicide prevention education, prior to the and/or following the discharge of the patient.  The suicide prevention education provided includes the following:  Suicide risk factors  Suicide prevention and interventions  National Suicide Hotline telephone number  [facility] assessment telephone number  [facility] Emergency Assistance [place] and/or Residential Mobile Crisis Unit telephone number  Request made of family/significant other to:  Remove weapons (e.g., guns, rifles, knives), all items previously/currently identified as safety concern.    Remove drugs/medications (over-the-counter, prescriptions, illicit drugs), all items previously/currently identified as a safety concern.  The family member/significant other verbalizes understanding of the suicide prevention education information provided.  The family member/significant other agrees to remove the items of safety concern listed above.  [name], [name] 06/19/2015, 10:17 AM

## 2015-06-28 ENCOUNTER — Encounter (HOSPITAL_COMMUNITY): Payer: Self-pay | Admitting: Emergency Medicine

## 2015-06-28 ENCOUNTER — Emergency Department (HOSPITAL_COMMUNITY)
Admission: EM | Admit: 2015-06-28 | Discharge: 2015-06-29 | Disposition: A | Discharge status: Other Acute Inpatient Hospital | Payer: Medicare Other | Attending: Emergency Medicine | Admitting: Emergency Medicine

## 2015-06-28 DIAGNOSIS — F419 Anxiety disorder, unspecified: Secondary | ICD-10-CM | POA: Insufficient documentation

## 2015-06-28 DIAGNOSIS — R441 Visual hallucinations: Secondary | ICD-10-CM | POA: Insufficient documentation

## 2015-06-28 DIAGNOSIS — F1310 Sedative, hypnotic or anxiolytic abuse, uncomplicated: Secondary | ICD-10-CM | POA: Insufficient documentation

## 2015-06-28 DIAGNOSIS — F1094 Alcohol use, unspecified with alcohol-induced mood disorder: Secondary | ICD-10-CM

## 2015-06-28 DIAGNOSIS — F1210 Cannabis abuse, uncomplicated: Secondary | ICD-10-CM | POA: Diagnosis not present

## 2015-06-28 DIAGNOSIS — F329 Major depressive disorder, single episode, unspecified: Secondary | ICD-10-CM | POA: Insufficient documentation

## 2015-06-28 DIAGNOSIS — R062 Wheezing: Secondary | ICD-10-CM | POA: Diagnosis not present

## 2015-06-28 DIAGNOSIS — Y9289 Other specified places as the place of occurrence of the external cause: Secondary | ICD-10-CM | POA: Diagnosis not present

## 2015-06-28 DIAGNOSIS — R109 Unspecified abdominal pain: Secondary | ICD-10-CM | POA: Insufficient documentation

## 2015-06-28 DIAGNOSIS — Y998 Other external cause status: Secondary | ICD-10-CM | POA: Diagnosis not present

## 2015-06-28 DIAGNOSIS — Y9389 Activity, other specified: Secondary | ICD-10-CM | POA: Diagnosis not present

## 2015-06-28 DIAGNOSIS — Z720 Tobacco use: Secondary | ICD-10-CM | POA: Diagnosis not present

## 2015-06-28 DIAGNOSIS — Z79899 Other long term (current) drug therapy: Secondary | ICD-10-CM | POA: Insufficient documentation

## 2015-06-28 DIAGNOSIS — R112 Nausea with vomiting, unspecified: Secondary | ICD-10-CM | POA: Diagnosis not present

## 2015-06-28 DIAGNOSIS — F32A Depression, unspecified: Secondary | ICD-10-CM

## 2015-06-28 DIAGNOSIS — F4310 Post-traumatic stress disorder, unspecified: Secondary | ICD-10-CM | POA: Diagnosis not present

## 2015-06-28 DIAGNOSIS — F1024 Alcohol dependence with alcohol-induced mood disorder: Secondary | ICD-10-CM | POA: Diagnosis not present

## 2015-06-28 DIAGNOSIS — T39012A Poisoning by aspirin, intentional self-harm, initial encounter: Secondary | ICD-10-CM | POA: Diagnosis not present

## 2015-06-28 DIAGNOSIS — R45851 Suicidal ideations: Secondary | ICD-10-CM | POA: Diagnosis not present

## 2015-06-28 MED ORDER — NICOTINE 21 MG/24HR TD PT24
21.0000 mg | MEDICATED_PATCH | Freq: Every day | TRANSDERMAL | Status: DC
  Administered 2015-06-29: 21 mg via TRANSDERMAL
  Filled 2015-06-28: qty 1

## 2015-06-28 MED ORDER — ONDANSETRON HCL 4 MG PO TABS
4.0000 mg | ORAL_TABLET | Freq: Three times a day (TID) | ORAL | Status: DC | PRN
  Administered 2015-06-29: 4 mg via ORAL
  Filled 2015-06-28: qty 1

## 2015-06-28 MED ORDER — LORAZEPAM 1 MG PO TABS: 0.0000 mg | ORAL_TABLET | Freq: Two times a day (BID) | ORAL | Status: DC

## 2015-06-28 MED ORDER — LORAZEPAM 1 MG PO TABS
0.0000 mg | ORAL_TABLET | Freq: Four times a day (QID) | ORAL | Status: DC
  Administered 2015-06-29: 1 mg via ORAL
  Filled 2015-06-28: qty 1

## 2015-06-28 NOTE — ED Notes (Signed)
Pt from home c/o alcohol withdrawal, suicidal ideations, and taking handful of ASA yesterday. He reports last drink was yesterday.  Denies Homicidal ideations. He reports diarrhea. Denies bloody stools.

## 2015-06-28 NOTE — ED Provider Notes (Signed)
History  By signing my name below, I, Marlowe Kays, attest that this documentation has been prepared under the direction and in the presence of [employer], PA-C. Electronically Signed: Marlowe Kays, ED Scribe. 06/28/2015. 11:57 PM.  Chief Complaint  Patient presents with  . Suicidal  . alcohol issues    The history is provided by the patient and medical records. No language interpreter was used.    HPI Comments:  [name] is a 44 y.o. male who presents to the Emergency Department complaining of SI and alcohol withdrawal. He reports associated centralized abdominal pain, diarrhea and two episodes of emesis (about 2 hours ago). Pt also reports associated nervousness/anxiousness. He states he normally drinks a case of 24 beers daily with the last consumption of one case being yesterday. He states he has had seizures from withdrawing from alcohol in the past with the last one being "a couple years ago". He went through detox here about two weeks ago and began drinking again immediately. He reports going to an intensive outpatient alcohol detox program three times weekly. He sees a psychiatrist every three months but states it is just for medication refills.   Pt also reports that he tried to kill himself yesterday by taking "a handful" of aspirin. He states he does not know how many he took. He reports continued thoughts of desire to harm himself. He reports associated hallucinations stating he sees shadows of vampires in his house. He endorses marijuana use and cigarette smoking. He denies audio hallucinations or HI.   Past Medical History  Diagnosis Date  . Psychosis   . PTSD (post-traumatic stress disorder)   . Seizure disorder ([place])     related to etoh seizure  . Alcohol abuse   . Schizoaffective disorder   . Anxiety     at age 39  . Depression     at age 40   Past Surgical History  Procedure Laterality Date  . Foot surgery      right  . Hand surgery     Family  History  Problem Relation Age of Onset  . Alcoholism Mother   . CAD Father   . Depression Brother    Social History  Substance Use Topics  . Smoking status: Current Every Day Smoker -- 1.00 packs/day for 24 years    Types: Cigarettes  . Smokeless tobacco: Never Used  . Alcohol Use: Yes     Comment: "I'm a binge drinker"  I drink case of beer per night    Review of Systems  Gastrointestinal: Positive for nausea, vomiting and abdominal pain.  Psychiatric/Behavioral: Positive for suicidal ideas, hallucinations and self-injury. The patient is nervous/anxious.   All other systems reviewed and are negative.   Allergies  Wellbutrin  Home Medications   Prior to Admission medications   Medication Sig Start Date End Date Taking? Authorizing Provider  allopurinol (ZYLOPRIM) 100 MG tablet Take 1 tablet (100 mg total) by mouth daily. 06/19/15  Yes Derrill Center, NP  famotidine (PEPCID) 20 MG tablet Take 1 tablet (20 mg total) by mouth 2 (two) times daily. Patient taking differently: Take 20 mg by mouth 2 (two) times daily as needed for heartburn.  06/19/15  Yes Derrill Center, NP  gabapentin (NEURONTIN) 400 MG capsule Take 1 capsule (400 mg total) by mouth 3 (three) times daily as needed. Patient taking differently: Take 400 mg by mouth 4 (four) times daily.  06/19/15  Yes Derrill Center, NP  sertraline (ZOLOFT) 25  MG tablet Take 3 tablets (75 mg total) by mouth daily. 06/19/15  Yes Derrill Center, NP  ziprasidone (GEODON) 60 MG capsule Take 1 capsule (60 mg total) by mouth 2 (two) times daily with a meal. 06/19/15  Yes Derrill Center, NP   Triage Vitals: BP 127/93 mmHg  Pulse 98  Temp(Src) 97.8 F (36.6 C) (Oral)  Resp 21  SpO2 95%  Physical Exam  Constitutional: He is oriented to person, place, and time. He appears well-developed and well-nourished. No distress.  Nontoxic/nonseptic appearing  HENT:  Head: Normocephalic and atraumatic.  Eyes: Conjunctivae and EOM are normal. No  scleral icterus.  Neck: Normal range of motion.  Cardiovascular: Normal rate, regular rhythm and intact distal pulses.   Pulmonary/Chest: Effort normal. No respiratory distress. He has no wheezes. He has no rales.  Mild inspiratory and expiratory wheezing diffusely. No rales or rhonchi. Respirations even and unlabored.  Musculoskeletal: Normal range of motion.  Neurological: He is alert and oriented to person, place, and time. He exhibits normal muscle tone. Coordination normal.  Skin: Skin is warm and dry. No rash noted. He is not diaphoretic. No erythema. No pallor.  Psychiatric: His speech is normal. His mood appears anxious. He is actively hallucinating. Thought content is paranoid. He exhibits a depressed mood. He expresses suicidal ideation. He expresses no homicidal ideation.  Nursing note and vitals reviewed.   ED Course  Procedures (including critical care time) DIAGNOSTIC STUDIES: Oxygen Saturation is 95% on RA, adequate by my interpretation.   COORDINATION OF CARE: 11:52 PM- Will order standard medical clearance labs, Ativan, Zofran and Nicotine patch. Pt verbalizes understanding and agrees to plan.  Medications  LORazepam (ATIVAN) tablet 0-4 mg (0 mg Oral Not Given 06/29/15 0553)    Followed by  LORazepam (ATIVAN) tablet 0-4 mg (not administered)  nicotine (NICODERM CQ - dosed in mg/24 hours) patch 21 mg (21 mg Transdermal Patch Applied 06/29/15 0045)  ondansetron (ZOFRAN) tablet 4 mg (4 mg Oral Given 06/29/15 0046)    Labs Review Labs Reviewed  CBC WITH DIFFERENTIAL/PLATELET - Abnormal; Notable for the following:    Hemoglobin 17.7 (*)    Lymphs Abs 4.2 (*)    All other components within normal limits  ACETAMINOPHEN LEVEL - Abnormal; Notable for the following:    Acetaminophen (Tylenol), Serum <10 (*)    All other components within normal limits  URINE RAPID DRUG SCREEN, HOSP PERFORMED - Abnormal; Notable for the following:    Benzodiazepines POSITIVE (*)     Tetrahydrocannabinol POSITIVE (*)    All other components within normal limits  ETHANOL - Abnormal; Notable for the following:    Alcohol, Ethyl (B) 191 (*)    All other components within normal limits  COMPREHENSIVE METABOLIC PANEL  SALICYLATE LEVEL    Imaging Review No results found.   I have personally reviewed and evaluated these images and lab results as part of my medical decision-making.   EKG Interpretation None      MDM   Final diagnoses:  Depressive disorder  Alcohol-induced mood disorder ([place])    44 year old male presented to the emergency department for suicidal ideation. He reports attempted overdose yesterday by taking a handful of aspirin. Patient has a normal salicylate level today as well as a normal acetaminophen level. Labs consistent with prior workups. Patient has been medically cleared. TTS recommend inpatient treatment. Will transfer to [name] for further psychiatric care.  I personally performed the services described in this documentation, which was scribed in  my presence. The recorded information has been reviewed and is accurate.    Filed Vitals:   06/28/15 2336  BP: 127/93  Pulse: 98  Temp: 97.8 F (36.6 C)  TempSrc: Oral  Resp: 21  SpO2: 95%       Antonietta Breach, PA-C 06/29/15 [place], DO 06/29/15 [phone]

## 2015-06-29 ENCOUNTER — Encounter (HOSPITAL_COMMUNITY): Payer: Self-pay

## 2015-06-29 ENCOUNTER — Observation Stay (HOSPITAL_COMMUNITY)
Admission: AD | Admit: 2015-06-29 | Discharge: 2015-06-30 | Disposition: A | Discharge status: Home / Self Care | Payer: Medicare Other | Source: Intra-hospital | Attending: Psychiatry | Admitting: Psychiatry

## 2015-06-29 DIAGNOSIS — Z888 Allergy status to other drugs, medicaments and biological substances status: Secondary | ICD-10-CM | POA: Insufficient documentation

## 2015-06-29 DIAGNOSIS — F17210 Nicotine dependence, cigarettes, uncomplicated: Secondary | ICD-10-CM | POA: Insufficient documentation

## 2015-06-29 DIAGNOSIS — F259 Schizoaffective disorder, unspecified: Secondary | ICD-10-CM | POA: Diagnosis present

## 2015-06-29 DIAGNOSIS — F1024 Alcohol dependence with alcohol-induced mood disorder: Secondary | ICD-10-CM | POA: Diagnosis not present

## 2015-06-29 DIAGNOSIS — F1994 Other psychoactive substance use, unspecified with psychoactive substance-induced mood disorder: Secondary | ICD-10-CM | POA: Diagnosis present

## 2015-06-29 DIAGNOSIS — F4310 Post-traumatic stress disorder, unspecified: Secondary | ICD-10-CM | POA: Insufficient documentation

## 2015-06-29 DIAGNOSIS — Z9114 Patient's other noncompliance with medication regimen: Secondary | ICD-10-CM | POA: Diagnosis not present

## 2015-06-29 LAB — COMPREHENSIVE METABOLIC PANEL
ALT: 35 U/L (ref 17–63)
AST: 27 U/L (ref 15–41)
Albumin: 4.9 g/dL (ref 3.5–5.0)
Alkaline Phosphatase: 86 U/L (ref 38–126)
Anion gap: 15 (ref 5–15)
BUN: 9 mg/dL (ref 6–20)
CO2: 22 mmol/L (ref 22–32)
Calcium: 9.2 mg/dL (ref 8.9–10.3)
Chloride: 101 mmol/L (ref 101–111)
Creatinine, Ser: 0.8 mg/dL (ref 0.61–1.24)
GFR calc Af Amer: >60 mL/min (ref >60)
GFR calc non Af Amer: >60 mL/min (ref >60)
Glucose, Bld: 88 mg/dL (ref 65–99)
Potassium: 3.9 mmol/L (ref 3.5–5.1)
Sodium: 138 mmol/L (ref 135–145)
Total Bilirubin: 0.7 mg/dL (ref 0.3–1.2)
Total Protein: 8.1 g/dL (ref 6.5–8.1)

## 2015-06-29 LAB — CBC WITH DIFFERENTIAL/PLATELET
Basophils Absolute: 0.1 10*3/uL (ref 0.0–0.1)
Basophils Relative: 1 %
Eosinophils Absolute: 0.1 10*3/uL (ref 0.0–0.7)
Eosinophils Relative: 2 %
HCT: 50 % (ref 39.0–52.0)
Hemoglobin: 17.7 g/dL — ABNORMAL HIGH (ref 13.0–17.0)
Lymphocytes Relative: 53 %
Lymphs Abs: 4.2 10*3/uL — ABNORMAL HIGH (ref 0.7–4.0)
MCH: 32.6 pg (ref 26.0–34.0)
MCHC: 35.4 g/dL (ref 30.0–36.0)
MCV: 92.1 fL (ref 78.0–100.0)
Monocytes Absolute: 0.6 10*3/uL (ref 0.1–1.0)
Monocytes Relative: 7 %
Neutro Abs: 2.9 10*3/uL (ref 1.7–7.7)
Neutrophils Relative %: 37 %
Platelets: 390 10*3/uL (ref 150–400)
RBC: 5.43 MIL/uL (ref 4.22–5.81)
RDW: 13.9 % (ref 11.5–15.5)
WBC: 7.9 10*3/uL (ref 4.0–10.5)

## 2015-06-29 LAB — SALICYLATE LEVEL: Salicylate Lvl: <4 mg/dL (ref 2.8–30.0)

## 2015-06-29 LAB — RAPID URINE DRUG SCREEN, HOSP PERFORMED
Amphetamines: NOT DETECTED
Barbiturates: NOT DETECTED
Benzodiazepines: POSITIVE — AB
Cocaine: NOT DETECTED
Opiates: NOT DETECTED
Tetrahydrocannabinol: POSITIVE — AB

## 2015-06-29 LAB — ETHANOL: Alcohol, Ethyl (B): 191 mg/dL — ABNORMAL HIGH (ref <5)

## 2015-06-29 LAB — ACETAMINOPHEN LEVEL: Acetaminophen (Tylenol), Serum: <10 ug/mL — ABNORMAL LOW (ref 10–30)

## 2015-06-29 MED ORDER — MAGNESIUM HYDROXIDE 400 MG/5ML PO SUSP: 30.0000 mL | Freq: Every day | ORAL | Status: DC | PRN

## 2015-06-29 MED ORDER — ZIPRASIDONE HCL 60 MG PO CAPS
60.0000 mg | ORAL_CAPSULE | Freq: Two times a day (BID) | ORAL | Status: DC
  Administered 2015-06-29 – 2015-06-30 (×2): 60 mg via ORAL
  Filled 2015-06-29 (×2): qty 1

## 2015-06-29 MED ORDER — SERTRALINE HCL 25 MG PO TABS
ORAL_TABLET | ORAL | Status: AC
  Administered 2015-06-29: 25 mg
  Filled 2015-06-29: qty 1

## 2015-06-29 MED ORDER — FAMOTIDINE 20 MG PO TABS
20.0000 mg | ORAL_TABLET | Freq: Two times a day (BID) | ORAL | Status: DC
  Administered 2015-06-29 – 2015-06-30 (×2): 20 mg via ORAL
  Filled 2015-06-29 (×2): qty 1

## 2015-06-29 MED ORDER — SERTRALINE HCL 50 MG PO TABS
ORAL_TABLET | ORAL | Status: AC
  Administered 2015-06-29: 50 mg
  Filled 2015-06-29: qty 1

## 2015-06-29 MED ORDER — ALLOPURINOL 100 MG PO TABS
100.0000 mg | ORAL_TABLET | Freq: Every day | ORAL | Status: DC
  Administered 2015-06-29 – 2015-06-30 (×2): 100 mg via ORAL
  Filled 2015-06-29 (×4): qty 1

## 2015-06-29 MED ORDER — ALUM & MAG HYDROXIDE-SIMETH 200-200-20 MG/5ML PO SUSP: 30.0000 mL | ORAL | Status: DC | PRN

## 2015-06-29 MED ORDER — HYDROXYZINE HCL 50 MG PO TABS
50.0000 mg | ORAL_TABLET | Freq: Three times a day (TID) | ORAL | Status: DC | PRN
  Administered 2015-06-29: 50 mg via ORAL
  Filled 2015-06-29: qty 1

## 2015-06-29 MED ORDER — TRAZODONE HCL 50 MG PO TABS: 50.0000 mg | ORAL_TABLET | Freq: Every evening | ORAL | Status: DC | PRN

## 2015-06-29 MED ORDER — GABAPENTIN 400 MG PO CAPS
400.0000 mg | ORAL_CAPSULE | Freq: Three times a day (TID) | ORAL | Status: DC
  Administered 2015-06-29 – 2015-06-30 (×2): 400 mg via ORAL
  Filled 2015-06-29 (×2): qty 1

## 2015-06-29 MED ORDER — SERTRALINE HCL 50 MG PO TABS
75.0000 mg | ORAL_TABLET | Freq: Every day | ORAL | Status: DC
  Administered 2015-06-30: 75 mg via ORAL
  Filled 2015-06-29 (×2): qty 1

## 2015-06-29 MED ORDER — ACETAMINOPHEN 325 MG PO TABS: 650.0000 mg | ORAL_TABLET | Freq: Four times a day (QID) | ORAL | Status: DC | PRN

## 2015-06-29 NOTE — ED Notes (Signed)
Poison control suggested that if salicylate level is greater than 40 to give sodium bicarb

## 2015-06-29 NOTE — H&P (Signed)
Psychiatric Admission Assessment Adult  Patient Identification: [name] MRN:  [id number] Date of Evaluation:  06/29/2015 Chief Complaint:  SCHIZOAFFECTIVE DISORDER Principal Diagnosis: Substance induced mood disorder ([name]) Diagnosis:   Patient Active Problem List   Diagnosis Date Noted  . Substance induced mood disorder ([name]) [F19.94] 06/29/2015    Priority: High  . Alcohol use disorder, severe, dependence ([name]) [F10.20] 11/29/2014    Priority: High  . Schizoaffective disorder, depressive type ([name]) [F25.1]   . Alcohol use ([name]) [Z78.9]   . Schizoaffective disorder ([name]) [F25.9] 05/13/2015  . Alcohol-induced mood disorder ([name]) [F10.94] 05/05/2015  . Suicidal ideation [R45.851] 04/09/2015  . Suicide ideation [R45.851]   . Major depressive disorder, recurrent episode, mild ([name]) [F33.0]   . Alcohol dependence with alcohol-induced mood disorder ([name]) [F10.24]   . Current smoker [Z72.0] 11/20/2014  . Tremors [G25.2] 11/03/2014  . Noncompliance with medication regimen [Z91.14]   . Post traumatic stress disorder (PTSD) [F43.10] 11/03/2011   History of Present Illness:  Per TTS counselor, [name] is an 44 y.o.divorced male who came to the [name] today following a suicide attempt. Pt sts that he ingested a "handful" of aspirin and then drank alcohol in an attempt to kill himself. Pt sts that he has been chronically depressed for a long time and has a hx of binge drinking. Pt sts that recent events such as talking to his ex-wife and the results of the recent presidential election had increased his depression and caused him to attempt suicide.  Patient states that recent talk with his ex-wife triggered him to have an emotional breakdown.    Pt has been previously diagnosed with Schizoaffective Disorder, PTSD, GAD and alcohol abuse. Pt denied HI.  He verbalizing passive SI without a plan.  He states that outpatient substance treatment had been effective but he just had a relapse.     Associated Signs/Symptoms: Depression Symptoms:  depressed mood, hopelessness, suicidal attempt, anxiety, (Hypo) Manic Symptoms:  Irritable Mood, Labiality of Mood, Anxiety Symptoms:  Excessive Worry, Psychotic Symptoms:  NA PTSD Symptoms: NA Total Time spent with patient: 30 minutes  Past Psychiatric History: depression, schizoaffective disorder, substance abuse  Risk to Self:   Risk to Others:   Prior Inpatient Therapy:   Prior Outpatient Therapy:    Alcohol Screening:   Substance Abuse History in the last 12 months:  Yes.   Consequences of Substance Abuse: NA Previous Psychotropic Medications: Yes  Psychological Evaluations: Yes  Past Medical History:  Past Medical History  Diagnosis Date  . Psychosis   . PTSD (post-traumatic stress disorder)   . Seizure disorder ([place])     related to etoh seizure  . Alcohol abuse   . Schizoaffective disorder   . Anxiety     at age 80  . Depression     at age 66    Past Surgical History  Procedure Laterality Date  . Foot surgery      right  . Hand surgery     Family History:  Family History  Problem Relation Age of Onset  . Alcoholism Mother   . CAD Father   . Depression Brother    Family Psychiatric  History:  See above Social History:  History  Alcohol Use  . Yes    Comment: "I'm a binge drinker"  I drink case of beer per night     History  Drug Use  . 1.00 per week  . Special: Marijuana    Comment: THC 2 to 3 times per  month    Social History   Social History  . Marital Status: Divorced    Spouse Name: N/A  . Number of Children: N/A  . Years of Education: N/A   Social History Main Topics  . Smoking status: Current Every Day Smoker -- 1.00 packs/day for 24 years    Types: Cigarettes  . Smokeless tobacco: Never Used  . Alcohol Use: Yes     Comment: "I'm a binge drinker"  I drink case of beer per night  . Drug Use: 1.00 per week    Special: Marijuana     Comment: THC 2 to 3 times per month  .  Sexual Activity: Yes    Birth Control/ Protection: Condom   Other Topics Concern  . None   Social History Narrative   Additional Social History:   Allergies:   Allergies  Allergen Reactions  . Wellbutrin [Bupropion] Other (See Comments)    Makes me feel like I'm have a seizure   Lab Results:  Results for orders placed or performed during the Bautista encounter of 06/28/15 (from the past 48 hour(s))  CBC with Differential     Status: Abnormal   Collection Time: 06/28/15 11:55 PM  Result Value Ref Range   WBC 7.9 4.0 - 10.5 K/uL   RBC 5.43 4.22 - 5.81 MIL/uL   Hemoglobin 17.7 (H) 13.0 - 17.0 g/dL   HCT 50.0 39.0 - 52.0 %   MCV 92.1 78.0 - 100.0 fL   MCH 32.6 26.0 - 34.0 pg   MCHC 35.4 30.0 - 36.0 g/dL   RDW 13.9 11.5 - 15.5 %   Platelets 390 150 - 400 K/uL   Neutrophils Relative % 37 %   Neutro Abs 2.9 1.7 - 7.7 K/uL   Lymphocytes Relative 53 %   Lymphs Abs 4.2 (H) 0.7 - 4.0 K/uL   Monocytes Relative 7 %   Monocytes Absolute 0.6 0.1 - 1.0 K/uL   Eosinophils Relative 2 %   Eosinophils Absolute 0.1 0.0 - 0.7 K/uL   Basophils Relative 1 %   Basophils Absolute 0.1 0.0 - 0.1 K/uL  Comprehensive metabolic panel     Status: None   Collection Time: 06/28/15 11:55 PM  Result Value Ref Range   Sodium 138 135 - 145 mmol/L   Potassium 3.9 3.5 - 5.1 mmol/L   Chloride 101 101 - 111 mmol/L   CO2 22 22 - 32 mmol/L   Glucose, Bld 88 65 - 99 mg/dL   BUN 9 6 - 20 mg/dL   Creatinine, Ser 0.80 0.61 - 1.24 mg/dL   Calcium 9.2 8.9 - 10.3 mg/dL   Total Protein 8.1 6.5 - 8.1 g/dL   Albumin 4.9 3.5 - 5.0 g/dL   AST 27 15 - 41 U/L   ALT 35 17 - 63 U/L   Alkaline Phosphatase 86 38 - 126 U/L   Total Bilirubin 0.7 0.3 - 1.2 mg/dL   GFR calc non Af Amer >60 >60 mL/min   GFR calc Af Amer >60 >60 mL/min    Comment: (NOTE) The eGFR has been calculated using the CKD EPI equation. This calculation has not been validated in all clinical situations. eGFR's persistently <60 mL/min signify  possible Chronic Kidney Disease.    Anion gap 15 5 - 15  Salicylate level     Status: None   Collection Time: 06/28/15 11:55 PM  Result Value Ref Range   Salicylate Lvl <0.3 2.8 - 30.0 mg/dL  Acetaminophen level  Status: Abnormal   Collection Time: 06/28/15 11:55 PM  Result Value Ref Range   Acetaminophen (Tylenol), Serum <10 (L) 10 - 30 ug/mL    Comment:        THERAPEUTIC CONCENTRATIONS VARY SIGNIFICANTLY. A RANGE OF 10-30 ug/mL MAY BE AN EFFECTIVE CONCENTRATION FOR MANY PATIENTS. HOWEVER, SOME ARE BEST TREATED AT CONCENTRATIONS OUTSIDE THIS RANGE. ACETAMINOPHEN CONCENTRATIONS >150 ug/mL AT 4 HOURS AFTER INGESTION AND >50 ug/mL AT 12 HOURS AFTER INGESTION ARE OFTEN ASSOCIATED WITH TOXIC REACTIONS.   Ethanol     Status: Abnormal   Collection Time: 06/28/15 11:55 PM  Result Value Ref Range   Alcohol, Ethyl (B) 191 (H) <5 mg/dL    Comment:        LOWEST DETECTABLE LIMIT FOR SERUM ALCOHOL IS 5 mg/dL FOR MEDICAL PURPOSES ONLY   Urine rapid drug screen (hosp performed)     Status: Abnormal   Collection Time: 06/29/15 12:12 AM  Result Value Ref Range   Opiates NONE DETECTED NONE DETECTED   Cocaine NONE DETECTED NONE DETECTED   Benzodiazepines POSITIVE (A) NONE DETECTED   Amphetamines NONE DETECTED NONE DETECTED   Tetrahydrocannabinol POSITIVE (A) NONE DETECTED   Barbiturates NONE DETECTED NONE DETECTED    Comment:        DRUG SCREEN FOR MEDICAL PURPOSES ONLY.  IF CONFIRMATION IS NEEDED FOR ANY PURPOSE, NOTIFY LAB WITHIN 5 DAYS.        LOWEST DETECTABLE LIMITS FOR URINE DRUG SCREEN Drug Class       Cutoff (ng/mL) Amphetamine      1000 Barbiturate      200 Benzodiazepine   045 Tricyclics       409 Opiates          300 Cocaine          300 THC              50     Metabolic Disorder Labs:  No results found for: HGBA1C, MPG Lab Results  Component Value Date   PROLACTIN 13.2 10/25/2014   Lab Results  Component Value Date   CHOL 244* 11/20/2014   TRIG  228* 11/20/2014   HDL 64 11/20/2014   CHOLHDL 3.8 11/20/2014   VLDL 46* 11/20/2014   LDLCALC 134* 11/20/2014    Current Medications: Current Facility-Administered Medications  Medication Dose Route Frequency Provider Last Rate Last Dose  . acetaminophen (TYLENOL) tablet 650 mg  650 mg Oral Q6H PRN Kerrie Buffalo, NP      . allopurinol (ZYLOPRIM) tablet 100 mg  100 mg Oral Daily Kerrie Buffalo, NP   100 mg at 06/29/15 1524  . alum & mag hydroxide-simeth (MAALOX/MYLANTA) 200-200-20 MG/5ML suspension 30 mL  30 mL Oral Q4H PRN Kerrie Buffalo, NP      . famotidine (PEPCID) tablet 20 mg  20 mg Oral BID Kerrie Buffalo, NP      . gabapentin (NEURONTIN) capsule 400 mg  400 mg Oral TID Kerrie Buffalo, NP      . hydrOXYzine (ATARAX/VISTARIL) tablet 50 mg  50 mg Oral TID PRN Kerrie Buffalo, NP   50 mg at 06/29/15 1527  . magnesium hydroxide (MILK OF MAGNESIA) suspension 30 mL  30 mL Oral Daily PRN Kerrie Buffalo, NP      . sertraline (ZOLOFT) tablet 75 mg  75 mg Oral Daily Kerrie Buffalo, NP   25 mg at 06/29/15 1525  . traZODone (DESYREL) tablet 50 mg  50 mg Oral QHS PRN Kerrie Buffalo, NP      .  ziprasidone (GEODON) capsule 60 mg  60 mg Oral BID WC Kerrie Buffalo, NP       PTA Medications: Prescriptions prior to admission  Medication Sig Dispense Refill Last Dose  . allopurinol (ZYLOPRIM) 100 MG tablet Take 1 tablet (100 mg total) by mouth daily. 30 tablet 0 06/28/2015 at Unknown time  . famotidine (PEPCID) 20 MG tablet Take 1 tablet (20 mg total) by mouth 2 (two) times daily. (Patient taking differently: Take 20 mg by mouth 2 (two) times daily as needed for heartburn. ) 60 tablet 0 Past Month at Unknown time  . gabapentin (NEURONTIN) 400 MG capsule Take 1 capsule (400 mg total) by mouth 3 (three) times daily as needed. (Patient taking differently: Take 400 mg by mouth 4 (four) times daily. ) 30 capsule 0 06/29/2015 at Unknown time  . sertraline (ZOLOFT) 25 MG tablet Take 3 tablets (75 mg total) by  mouth daily. 90 tablet 0 06/29/2015 at Unknown time  . ziprasidone (GEODON) 60 MG capsule Take 1 capsule (60 mg total) by mouth 2 (two) times daily with a meal. 60 capsule 0 06/29/2015 at Unknown time    Musculoskeletal: Strength & Muscle Tone: within normal limits Gait & Station: normal Patient leans: N/A  Psychiatric Specialty Exam: Physical Exam  Vitals reviewed. Psychiatric: His mood appears not anxious. Thought content is not paranoid. He exhibits a depressed mood. He expresses suicidal (passive SI ) ideation. He expresses no homicidal ideation. He expresses no suicidal plans and no homicidal plans.    Review of Systems  Psychiatric/Behavioral: Positive for depression, suicidal ideas and substance abuse. Negative for hallucinations. The patient is nervous/anxious.   All other systems reviewed and are negative.   Blood pressure 149/86, pulse 95, temperature 98.2 F (36.8 C), temperature source Oral, height _0  (1.778 m), weight 97.523 kg (215 lb).Body mass index is 30.85 kg/(m^2).  General Appearance: Disheveled  Eye Contact::  Poor  Speech:  Slow  Volume:  Normal  Mood:  Depressed  Affect:  Depressed  Thought Process:  Circumstantial  Orientation:  Full (Time, Place, and Person)  Thought Content:  Rumination  Suicidal Thoughts:  Yes.  without intent/plan  Homicidal Thoughts:  No  Memory:  Immediate;   Fair Recent;   Fair Remote;   Fair  Judgement:  Fair  Insight:  Fair  Psychomotor Activity:  Normal  Concentration:  Fair  Recall:  [employer] of Knowledge:Fair  Language: Fair  Akathisia:  Negative  Handed:  Right  AIMS (if indicated):     Assets:  Desire for Improvement  ADL's:  Impaired  Cognition: WNL  Sleep:      Treatment Plan Summary: Daily contact with patient to assess and evaluate symptoms and progress in treatment and Medication management  Will keep overnight.  Monitor for withdrawal symptoms and safety  Observation Level/Precautions:  Continuous  Observation  Laboratory:  per ED  Psychotherapy:  group  Medications:  As per medlist  Consultations:  As needed  Discharge Concerns:  safety  Estimated LOS:5-7 days  Other:     I certify that OBS unit services furnished can reasonably be expected to improve the patient's condition.   Freda Munro May Nysia Dell AGNP-BC 11/11/20164:11 PM

## 2015-06-29 NOTE — ED Notes (Signed)
Patient arrived to the unit with no s/s of distress noted. Pt states he is here for alcohol detox and for having suicidal ideations. Pt denies a specific plan and verbally contracts for safety at this time. Pt given a sandwich and soda on request. Pt appears drowsy due to meds given in the ER prior to arrival.
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
## 2015-06-29 NOTE — BH Assessment (Signed)
[place] Assessment Progress Note Patient assessed by Malachi Carl NP and Lj Miyamoto LCAS. Patient is still experiencing withdrawals and S/I. Patient will resume medication regimen while in the Observation Unit and be re-evaluated on 06/30/15 to determine disposition.

## 2015-06-29 NOTE — ED Notes (Signed)
Pt has 1 personal belongings bag searched and wanded by security, placed in behind triage nursing station.

## 2015-06-29 NOTE — Progress Notes (Signed)
Pt in bed.  Drowsy, no pain verbalized.  Cooperative. Wanting to sleep. Went back to sleep after vitals and assessment. Resting quietly. No voices heard.  Monitor for safety.

## 2015-06-30 DIAGNOSIS — F1024 Alcohol dependence with alcohol-induced mood disorder: Secondary | ICD-10-CM | POA: Diagnosis not present

## 2015-06-30 MED ORDER — GABAPENTIN 400 MG PO CAPS: 400.0000 mg | ORAL_CAPSULE | Freq: Three times a day (TID) | ORAL | Status: DC | PRN

## 2015-06-30 MED ORDER — ALLOPURINOL 100 MG PO TABS: 100.0000 mg | ORAL_TABLET | Freq: Every day | ORAL | Status: DC

## 2015-06-30 MED ORDER — FAMOTIDINE 20 MG PO TABS: 20.0000 mg | ORAL_TABLET | Freq: Two times a day (BID) | ORAL | Status: DC

## 2015-06-30 MED ORDER — SERTRALINE HCL 25 MG PO TABS: 75.0000 mg | ORAL_TABLET | Freq: Every day | ORAL | Status: DC

## 2015-06-30 MED ORDER — ZIPRASIDONE HCL 60 MG PO CAPS: 60.0000 mg | ORAL_CAPSULE | Freq: Two times a day (BID) | ORAL | Status: DC

## 2015-06-30 NOTE — Progress Notes (Signed)
Patient ID: [name], male   DOB: 10-26-1970, 44 y.o.   MRN: [id number] Pt interacting appropriately with staff and peer. No distress noted.  Pt reported he did not rest well last night and voiced c/o being tired this morning.  Pt denied SI, HI and AVH.  Needs assessed. Pt denied. Support and encouragement provided. Pt receptive.  Fifteen minute checks in progress for patient safety.  Pt safe on unit.

## 2015-06-30 NOTE — Discharge Summary (Signed)
OBS Discharge Summary Note  Patient:  [name] is an 44 y.o., male MRN:  [id number] DOB:  1971-07-22 Patient phone:  [phone] (home)  Patient address:   [address] [place] [address],  Total Time spent with patient: 30 minutes  Date of Admission:  06/29/2015 Date of Discharge:  06/30/2015  Reason for Admission:  Substance abuse  Principal Problem: Substance induced mood disorder [facility]) Discharge Diagnoses: Patient Active Problem List   Diagnosis Date Noted  . Substance induced mood disorder ([place]) [F19.94] 06/29/2015    Priority: High  . Alcohol use disorder, severe, dependence ([place]) [F10.20] 11/29/2014    Priority: High  . Schizoaffective disorder, depressive type ([place]) [F25.1]   . Alcohol use ([name]) [Z78.9]   . Schizoaffective disorder ([place]) [F25.9] 05/13/2015  . Alcohol-induced mood disorder ([place]) [F10.94] 05/05/2015  . Suicidal ideation [R45.851] 04/09/2015  . Suicide ideation [R45.851]   . Major depressive disorder, recurrent episode, mild ([place]) [F33.0]   . Alcohol dependence with alcohol-induced mood disorder ([place]) [F10.24]   . Current smoker [Z72.0] 11/20/2014  . Tremors [G25.2] 11/03/2014  . Noncompliance with medication regimen [Z91.14]   . Post traumatic stress disorder (PTSD) [F43.10] 11/03/2011    Musculoskeletal: Strength & Muscle Tone: within normal limits Gait & Station: normal Patient leans: N/A  Psychiatric Specialty Exam:  SEE MD SRA Physical Exam  Vitals reviewed. Psychiatric: His mood appears anxious. His affect is not angry. Thought content is not paranoid and not delusional. He does not exhibit a depressed mood. He expresses no homicidal and no suicidal ideation. He expresses no suicidal plans and no homicidal plans.    Review of Systems  Constitutional: Negative for fever.  Eyes: Negative for blurred vision.  Cardiovascular: Negative for chest pain.  Gastrointestinal: Negative for heartburn.  Neurological: Negative for  headaches.  Psychiatric/Behavioral: Negative for depression and suicidal ideas. The patient is nervous/anxious.   All other systems reviewed and are negative.   Blood pressure 121/93, pulse 73, temperature 97.7 F (36.5 C), temperature source Oral, resp. rate 16, height _0  (1.778 m), weight 97.523 kg (215 lb), SpO2 98 %.Body mass index is 30.85 kg/(m^2).     Has this patient used any form of tobacco in the last 30 days? (Cigarettes, Smokeless Tobacco, Cigars, and/or Pipes) N/A  Past Medical History:  Past Medical History  Diagnosis Date  . Psychosis   . PTSD (post-traumatic stress disorder)   . Seizure disorder ([place])     related to etoh seizure  . Alcohol abuse   . Schizoaffective disorder   . Anxiety     at age 65  . Depression     at age 71    Past Surgical History  Procedure Laterality Date  . Foot surgery      right  . Hand surgery     Family History:  Family History  Problem Relation Age of Onset  . Alcoholism Mother   . CAD Father   . Depression Brother    Social History:  History  Alcohol Use  . Yes    Comment: "I'm a binge drinker"  I drink case of beer per night     History  Drug Use  . 1.00 per week  . Special: Marijuana    Comment: THC 2 to 3 times per month    Social History   Social History  . Marital Status: Divorced    Spouse Name: N/A  . Number of Children: N/A  . Years of Education: N/A  Social History Main Topics  . Smoking status: Current Every Day Smoker -- 1.00 packs/day for 24 years    Types: Cigarettes  . Smokeless tobacco: Never Used  . Alcohol Use: Yes     Comment: "I'm a binge drinker"  I drink case of beer per night  . Drug Use: 1.00 per week    Special: Marijuana     Comment: THC 2 to 3 times per month  . Sexual Activity: Yes    Birth Control/ Protection: Condom   Other Topics Concern  . None   Social History Narrative   Risk to Self:   Risk to Others:   Prior Inpatient Therapy:   Prior Outpatient Therapy:     Level of Care:  OP  Hospital Course:  Shayn Madole was admitted for Substance induced mood disorder ([place]) and crisis management.  He was treated discharged with the medications listed below under Medication List.  Medical problems were identified and treated as needed.  Home medications were restarted as appropriate.  Improvement was monitored by observation and [name] daily report of symptom reduction.  Emotional and mental status was monitored by daily self-inventory reports completed by [name] and clinical staff.         Nixxon Faria was evaluated by the treatment team for stability and plans for continued recovery upon discharge.  Kamori Barbier motivation was an integral factor for scheduling further treatment.  Employment, transportation, bed availability, health status, family support, and any pending legal issues were also considered during his hospital stay.  He was offered further treatment options upon discharge including but not limited to Residential, Intensive Outpatient, and Outpatient treatment.  Calan Doren will follow up with the services as listed below under Follow Up Information.     Upon completion of this admission the patient was both mentally and medically stable for discharge denying suicidal/homicidal ideation, auditory/visual/tactile hallucinations, delusional thoughts and paranoia.      Consults:  psychiatry  Significant Diagnostic Studies:  labs: per ED  Discharge Vitals:   Blood pressure 121/93, pulse 73, temperature 97.7 F (36.5 C), temperature source Oral, resp. rate 16, height _0  (1.778 m), weight 97.523 kg (215 lb), SpO2 98 %. Body mass index is 30.85 kg/(m^2). Lab Results:   Results for orders placed or performed during the hospital encounter of 06/28/15 (from the past 72 hour(s))  CBC with Differential     Status: Abnormal   Collection Time: 06/28/15 11:55 PM  Result Value Ref Range   WBC 7.9 4.0 - 10.5 K/uL   RBC 5.43  4.22 - 5.81 MIL/uL   Hemoglobin 17.7 (H) 13.0 - 17.0 g/dL   HCT 50.0 39.0 - 52.0 %   MCV 92.1 78.0 - 100.0 fL   MCH 32.6 26.0 - 34.0 pg   MCHC 35.4 30.0 - 36.0 g/dL   RDW 13.9 11.5 - 15.5 %   Platelets 390 150 - 400 K/uL   Neutrophils Relative % 37 %   Neutro Abs 2.9 1.7 - 7.7 K/uL   Lymphocytes Relative 53 %   Lymphs Abs 4.2 (H) 0.7 - 4.0 K/uL   Monocytes Relative 7 %   Monocytes Absolute 0.6 0.1 - 1.0 K/uL   Eosinophils Relative 2 %   Eosinophils Absolute 0.1 0.0 - 0.7 K/uL   Basophils Relative 1 %   Basophils Absolute 0.1 0.0 - 0.1 K/uL  Comprehensive metabolic panel     Status: None   Collection Time: 06/28/15 11:55 PM  Result Value Ref  Range   Sodium 138 135 - 145 mmol/L   Potassium 3.9 3.5 - 5.1 mmol/L   Chloride 101 101 - 111 mmol/L   CO2 22 22 - 32 mmol/L   Glucose, Bld 88 65 - 99 mg/dL   BUN 9 6 - 20 mg/dL   Creatinine, Ser 0.80 0.61 - 1.24 mg/dL   Calcium 9.2 8.9 - 10.3 mg/dL   Total Protein 8.1 6.5 - 8.1 g/dL   Albumin 4.9 3.5 - 5.0 g/dL   AST 27 15 - 41 U/L   ALT 35 17 - 63 U/L   Alkaline Phosphatase 86 38 - 126 U/L   Total Bilirubin 0.7 0.3 - 1.2 mg/dL   GFR calc non Af Amer >60 >60 mL/min   GFR calc Af Amer >60 >60 mL/min    Comment: (NOTE) The eGFR has been calculated using the CKD EPI equation. This calculation has not been validated in all clinical situations. eGFR's persistently <60 mL/min signify possible Chronic Kidney Disease.    Anion gap 15 5 - 15  Salicylate level     Status: None   Collection Time: 06/28/15 11:55 PM  Result Value Ref Range   Salicylate Lvl <7.8 2.8 - 30.0 mg/dL  Acetaminophen level     Status: Abnormal   Collection Time: 06/28/15 11:55 PM  Result Value Ref Range   Acetaminophen (Tylenol), Serum <10 (L) 10 - 30 ug/mL    Comment:        THERAPEUTIC CONCENTRATIONS VARY SIGNIFICANTLY. A RANGE OF 10-30 ug/mL MAY BE AN EFFECTIVE CONCENTRATION FOR MANY PATIENTS. HOWEVER, SOME ARE BEST TREATED AT CONCENTRATIONS OUTSIDE  THIS RANGE. ACETAMINOPHEN CONCENTRATIONS >150 ug/mL AT 4 HOURS AFTER INGESTION AND >50 ug/mL AT 12 HOURS AFTER INGESTION ARE OFTEN ASSOCIATED WITH TOXIC REACTIONS.   Ethanol     Status: Abnormal   Collection Time: 06/28/15 11:55 PM  Result Value Ref Range   Alcohol, Ethyl (B) 191 (H) <5 mg/dL    Comment:        LOWEST DETECTABLE LIMIT FOR SERUM ALCOHOL IS 5 mg/dL FOR MEDICAL PURPOSES ONLY   Urine rapid drug screen (hosp performed)     Status: Abnormal   Collection Time: 06/29/15 12:12 AM  Result Value Ref Range   Opiates NONE DETECTED NONE DETECTED   Cocaine NONE DETECTED NONE DETECTED   Benzodiazepines POSITIVE (A) NONE DETECTED   Amphetamines NONE DETECTED NONE DETECTED   Tetrahydrocannabinol POSITIVE (A) NONE DETECTED   Barbiturates NONE DETECTED NONE DETECTED    Comment:        DRUG SCREEN FOR MEDICAL PURPOSES ONLY.  IF CONFIRMATION IS NEEDED FOR ANY PURPOSE, NOTIFY LAB WITHIN 5 DAYS.        LOWEST DETECTABLE LIMITS FOR URINE DRUG SCREEN Drug Class       Cutoff (ng/mL) Amphetamine      1000 Barbiturate      200 Benzodiazepine   676 Tricyclics       720 Opiates          300 Cocaine          300 THC              50     Physical Findings: AIMS: Facial and Oral Movements Muscles of Facial Expression: None, normal Lips and Perioral Area: None, normal Jaw: None, normal Tongue: None, normal,Extremity Movements Upper (arms, wrists, hands, fingers): None, normal Lower (legs, knees, ankles, toes): None, normal, Trunk Movements Neck, shoulders, hips: None, normal, Overall Severity Severity of abnormal movements (  highest score from questions above): None, normal Incapacitation due to abnormal movements: None, normal Patient's awareness of abnormal movements (rate only patient's report): No Awareness, Dental Status Current problems with teeth and/or dentures?: No Does patient usually wear dentures?: No  CIWA:  CIWA-Ar Total: 0 COWS:        Discharge  destination:  Home  Is patient on multiple antipsychotic therapies at discharge:  No   Has Patient had three or more failed trials of antipsychotic monotherapy by history:  No  Recommended Plan for Multiple Antipsychotic Therapies: NA     Medication List    TAKE these medications      Indication   allopurinol 100 MG tablet  Commonly known as:  ZYLOPRIM  Take 1 tablet (100 mg total) by mouth daily.   Indication:  Primary Gout, gout     famotidine 20 MG tablet  Commonly known as:  PEPCID  Take 1 tablet (20 mg total) by mouth 2 (two) times daily.   Indication:  Gastroesophageal Reflux Disease     gabapentin 400 MG capsule  Commonly known as:  NEURONTIN  Take 1 capsule (400 mg total) by mouth 3 (three) times daily as needed.   Indication:  Neuropathic Pain     sertraline 25 MG tablet  Commonly known as:  ZOLOFT  Take 3 tablets (75 mg total) by mouth daily.   Indication:  Major Depressive Disorder     ziprasidone 60 MG capsule  Commonly known as:  GEODON  Take 1 capsule (60 mg total) by mouth 2 (two) times daily with a meal.   Indication:  mood stabilization         Follow-up recommendations:  Activity:  as tol Diet:  as tol  Comments:  1.  Take all your medications as prescribed.              2.  Report any adverse side effects to outpatient provider.                       3.  Patient instructed to not use alcohol or illegal drugs while on prescription medicines.            4.  In the event of worsening symptoms, instructed patient to call 911, the crisis hotline or go to nearest emergency room for evaluation of symptoms.  Total Discharge Time:  30 min  Signed: Freda Munro May Agustin AGNP-BC  Case discussed, agree with plan that patient can be discharged with outpatient follow up Hampton Abbot, MD 06/30/2015, 9:59 AM

## 2015-06-30 NOTE — Progress Notes (Signed)
06:30  Awake in bed.  Has slept all night with exception of awake for vitals and assessment. Will continue to monitor for safety.

## 2015-06-30 NOTE — Discharge Instructions (Signed)
Continue to follow up with [facility] for medication management and [facility] at [facility].

## 2015-06-30 NOTE — Progress Notes (Signed)
Patient ID: [name], male   DOB: 10/15/1970, 44 y.o.   MRN: [id number] Pt d/c with all belongings.  Pt denied questions/concern/pain.  Pt verbalized understanding of discharge instructions.  Pt denied SI, HI and AVH.  No distressed noted. Pt transported to [facility] via [facility].

## 2015-06-30 NOTE — BH Assessment (Signed)
[facility] Assessment Progress Note  Patient was discharged this date and will follow up with [facility] for medication management and [facility] at [facility].

## 2015-07-02 DIAGNOSIS — F250 Schizoaffective disorder, bipolar type: Secondary | ICD-10-CM | POA: Diagnosis not present

## 2015-07-02 DIAGNOSIS — F251 Schizoaffective disorder, depressive type: Secondary | ICD-10-CM | POA: Diagnosis not present

## 2015-07-05 DIAGNOSIS — H43393 Other vitreous opacities, bilateral: Secondary | ICD-10-CM | POA: Diagnosis not present

## 2015-07-05 DIAGNOSIS — H40033 Anatomical narrow angle, bilateral: Secondary | ICD-10-CM | POA: Diagnosis not present

## 2015-07-24 DIAGNOSIS — H04123 Dry eye syndrome of bilateral lacrimal glands: Secondary | ICD-10-CM | POA: Diagnosis not present

## 2015-08-21 DIAGNOSIS — F251 Schizoaffective disorder, depressive type: Secondary | ICD-10-CM | POA: Diagnosis not present

## 2015-08-21 DIAGNOSIS — F250 Schizoaffective disorder, bipolar type: Secondary | ICD-10-CM | POA: Diagnosis not present

## 2015-09-13 ENCOUNTER — Ambulatory Visit: Payer: Medicare Other | Attending: Family Medicine | Admitting: Family Medicine

## 2015-09-13 ENCOUNTER — Encounter: Payer: Self-pay | Admitting: Family Medicine

## 2015-09-13 VITALS — BP 130/85 | HR 95 | Temp 99.0°F | Resp 16 | Ht 71.0 in | Wt 243.0 lb

## 2015-09-13 DIAGNOSIS — Z0000 Encounter for general adult medical examination without abnormal findings: Secondary | ICD-10-CM | POA: Diagnosis not present

## 2015-09-13 DIAGNOSIS — Z79899 Other long term (current) drug therapy: Secondary | ICD-10-CM | POA: Insufficient documentation

## 2015-09-13 DIAGNOSIS — F17210 Nicotine dependence, cigarettes, uncomplicated: Secondary | ICD-10-CM | POA: Insufficient documentation

## 2015-09-13 DIAGNOSIS — M109 Gout, unspecified: Secondary | ICD-10-CM | POA: Diagnosis not present

## 2015-09-13 DIAGNOSIS — F259 Schizoaffective disorder, unspecified: Secondary | ICD-10-CM | POA: Insufficient documentation

## 2015-09-13 DIAGNOSIS — M1009 Idiopathic gout, multiple sites: Secondary | ICD-10-CM | POA: Diagnosis not present

## 2015-09-13 LAB — COMPLETE METABOLIC PANEL WITH GFR
ALT: 22 U/L (ref 9–46)
AST: 21 U/L (ref 10–40)
Albumin: 3.8 g/dL (ref 3.6–5.1)
Alkaline Phosphatase: 83 U/L (ref 40–115)
BUN: 14 mg/dL (ref 7–25)
CO2: 26 mmol/L (ref 20–31)
Calcium: 9.2 mg/dL (ref 8.6–10.3)
Chloride: 101 mmol/L (ref 98–110)
Creat: 0.93 mg/dL (ref 0.60–1.35)
GFR, Est African American: >89 mL/min (ref >=60)
GFR, Est Non African American: >89 mL/min (ref >=60)
Glucose, Bld: 103 mg/dL — ABNORMAL HIGH (ref 65–99)
Potassium: 4.3 mmol/L (ref 3.5–5.3)
Sodium: 138 mmol/L (ref 135–146)
Total Bilirubin: 0.4 mg/dL (ref 0.2–1.2)
Total Protein: 6.3 g/dL (ref 6.1–8.1)

## 2015-09-13 LAB — CBC
HCT: 41.3 % (ref 39.0–52.0)
Hemoglobin: 13.8 g/dL (ref 13.0–17.0)
MCH: 31.2 pg (ref 26.0–34.0)
MCHC: 33.4 g/dL (ref 30.0–36.0)
MCV: 93.2 fL (ref 78.0–100.0)
MPV: 9.9 fL (ref 8.6–12.4)
Platelets: 216 10*3/uL (ref 150–400)
RBC: 4.43 MIL/uL (ref 4.22–5.81)
RDW: 14.3 % (ref 11.5–15.5)
WBC: 9.7 10*3/uL (ref 4.0–10.5)

## 2015-09-13 LAB — POCT GLYCOSYLATED HEMOGLOBIN (HGB A1C): Hemoglobin A1C: 5.3

## 2015-09-13 LAB — URIC ACID: Uric Acid, Serum: 6.2 mg/dL (ref 4.0–7.8)

## 2015-09-13 MED ORDER — INDOMETHACIN 50 MG PO CAPS: 50.0000 mg | ORAL_CAPSULE | Freq: Three times a day (TID) | ORAL | Status: DC | PRN

## 2015-09-13 MED ORDER — ACETAMINOPHEN-CODEINE #3 300-30 MG PO TABS: 1.0000 | ORAL_TABLET | Freq: Three times a day (TID) | ORAL | Status: DC | PRN

## 2015-09-13 NOTE — Progress Notes (Signed)
Subjective:  Patient ID: [name], male    DOB: 1970-12-23  Age: 45 y.o. MRN: [id number]  CC: Gout   HPI Addyson Vailes presents for    1. Gout: x one week. Pain in L hand, L foot. Also in R elbow but this has improved. With swelling. Pain is 10/10. He took naproxen 500 mg without relief. Feeling warm. No chills. No redness in joint. No skin rash. He has not been compliant with allopurinol for 3 months. Denies ETOH. Admits to poor diet.   2. Schizoaffective disorder: improved. He stopped geodon and went back on seroquel. Mood is better. Hallucinations are less. He is followed by mental health.   Social History  Substance Use Topics  . Smoking status: Current Every Day Smoker -- 1.00 packs/day for 24 years    Types: Cigarettes  . Smokeless tobacco: Never Used  . Alcohol Use: Yes     Comment: "I'm a binge drinker"  I drink case of beer per night   Outpatient Prescriptions Prior to Visit  Medication Sig Dispense Refill  . allopurinol (ZYLOPRIM) 100 MG tablet Take 1 tablet (100 mg total) by mouth daily. 30 tablet 0  . famotidine (PEPCID) 20 MG tablet Take 1 tablet (20 mg total) by mouth 2 (two) times daily. 60 tablet 0  . gabapentin (NEURONTIN) 400 MG capsule Take 1 capsule (400 mg total) by mouth 3 (three) times daily as needed. 30 capsule 0  . sertraline (ZOLOFT) 25 MG tablet Take 3 tablets (75 mg total) by mouth daily. 90 tablet 0  . ziprasidone (GEODON) 60 MG capsule Take 1 capsule (60 mg total) by mouth 2 (two) times daily with a meal. 60 capsule 0   No facility-administered medications prior to visit.    ROS Review of Systems  Constitutional: Negative for fever, chills, fatigue and unexpected weight change.  Eyes: Negative for visual disturbance.  Respiratory: Negative for cough and shortness of breath.   Cardiovascular: Negative for chest pain, palpitations and leg swelling.  Gastrointestinal: Negative for nausea, vomiting, abdominal pain, diarrhea, constipation and  blood in stool.  Musculoskeletal: Positive for arthralgias.  Skin: Negative for rash.  Psychiatric/Behavioral:       Denies SI Admits to visual and auditory hallucinations Denies command hallucinations Admits to berating hallucinations     Objective:  BP 130/85 mmHg  Pulse 95  Temp(Src) 99 F (37.2 C) (Oral)  Resp 16  Ht 5\' 11"  (1.803 m)  Wt 243 lb (110.224 kg)  BMI 33.91 kg/m2  SpO2 94%  Temp Readings from Last 3 Encounters:  09/13/15 99 F (37.2 C) Oral  06/30/15 97.7 F (36.5 C) Oral  06/29/15 98.6 F (37 C)     BP/Weight 09/13/2015 06/29/2015 [id number]  Systolic BP [id number]  Diastolic BP 85 81 93  Wt. (Lbs) 243 - -  BMI 33.91 - -  Some encounter information is confidential and restricted. Go to Review Flowsheets activity to see all data.   Physical Exam  Constitutional: He appears well-developed and well-nourished. No distress.  HENT:  Head: Normocephalic and atraumatic.  Neck: Normal range of motion. Neck supple.  Cardiovascular: Normal rate, regular rhythm, normal heart sounds and intact distal pulses.   Pulmonary/Chest: Effort normal and breath sounds normal.  Musculoskeletal: He exhibits edema and tenderness.  Swelling on dorsum of L hand and L foot   Neurological: He is alert.  Skin: Skin is warm and dry. No rash noted. No erythema.  Psychiatric: He has a normal mood  and affect.   Lab Results  Component Value Date   HGBA1C 5.30 09/13/2015   Depression screen [facility] 2/9 09/13/2015 02/16/2015  Decreased Interest 1 1  Down, Depressed, Hopeless 1 1  PHQ - 2 Score 2 2  Altered sleeping 1 1  Tired, decreased energy 1 0  Change in appetite 1 0  Feeling bad or failure about yourself  1 1  Trouble concentrating 0 0  Moving slowly or fidgety/restless 0 0  Suicidal thoughts 0 0  PHQ-9 Score 6 4   GAD 7 : Generalized Anxiety Score 09/13/2015  Nervous, Anxious, on Edge 2  Control/stop worrying 1  Worry too much - different things 1  Trouble relaxing 1    Restless 1  Easily annoyed or irritable 1  Afraid - awful might happen 1  Total GAD 7 Score 8      Assessment & Plan:  Sylvia was seen today for gout.  Diagnoses and all orders for this visit:  Healthcare maintenance -     POCT glycosylated hemoglobin (Hb A1C)  Acute gout of multiple sites, unspecified cause -     Uric Acid -     COMPLETE METABOLIC PANEL WITH GFR -     CBC -     indomethacin (INDOCIN) 50 MG capsule; Take 1 capsule (50 mg total) by mouth 3 (three) times daily as needed for moderate pain. -     acetaminophen-codeine (TYLENOL #3) 300-30 MG tablet; Take 1-2 tablets by mouth every 8 (eight) hours as needed for moderate pain. During gout flare     Follow-up: Return in about 2 weeks (around 09/27/2015) for gout .   Boykin Nearing MD

## 2015-09-13 NOTE — Patient Instructions (Addendum)
Eduin was seen today for gout.  Diagnoses and all orders for this visit:  Healthcare maintenance -     POCT glycosylated hemoglobin (Hb A1C)  Acute gout of multiple sites, unspecified cause -     Uric Acid -     COMPLETE METABOLIC PANEL WITH GFR -     CBC -     indomethacin (INDOCIN) 50 MG capsule; Take 1 capsule (50 mg total) by mouth 3 (three) times daily as needed for moderate pain. -     acetaminophen-codeine (TYLENOL #3) 300-30 MG tablet; Take 1-2 tablets by mouth every 8 (eight) hours as needed for moderate pain. During gout flare   Take indocin three times daily 50 mg with food until pain lessens significantly this should be 24-72 hrs  Take tylenol #3 for pain as well  Once pain is down significantly, so almost no pain wait another 48 hrs then start allopurinol once daily with indocin at least once daily to start reducing uric acid   If pain and swelling does not respond to indocin call or send mychart message and I will order oral steroid, prednisone  F/u in 2 weeks for gout   Dr. Adrian Blackwater

## 2015-09-13 NOTE — Assessment & Plan Note (Signed)
  Take indocin three times daily 50 mg with food until pain lessens significantly this should be 24-72 hrs  Take tylenol #3 for pain as well  Once pain is down significantly, so almost no pain wait another 48 hrs then start allopurinol once daily with indocin at least once daily to start reducing uric acid   If pain and swelling does not respond to indocin call or send mychart message and I will order oral steroid, prednisone

## 2015-09-13 NOTE — Progress Notes (Signed)
C/C Gout on lt hand and lt foot x 1 week Pain scale #10 Tobacco user 1ppday  No suicidal thoughts in the past two weeks

## 2015-09-14 ENCOUNTER — Telehealth: Payer: Self-pay | Admitting: Family Medicine

## 2015-09-14 NOTE — Telephone Encounter (Signed)
Pt call to notified pain scale #3 No changes on swelling on feet

## 2015-09-17 NOTE — Telephone Encounter (Signed)
Noted. Continue current care plan for gout flare

## 2015-09-24 ENCOUNTER — Telehealth: Payer: Self-pay | Admitting: *Deleted

## 2015-09-24 NOTE — Telephone Encounter (Signed)
-----   Message from Boykin Nearing, MD sent at 09/13/2015  8:54 PM EST ----- All labs normal  Patient's mychart pending  Encourage patient to sign up for mychart to receive results

## 2015-09-24 NOTE — Telephone Encounter (Signed)
LVM to return call.

## 2015-09-27 ENCOUNTER — Ambulatory Visit: Payer: Self-pay | Admitting: Family Medicine

## 2015-09-30 ENCOUNTER — Inpatient Hospital Stay (HOSPITAL_COMMUNITY): Payer: Medicare Other

## 2015-09-30 ENCOUNTER — Inpatient Hospital Stay (HOSPITAL_COMMUNITY)
Admission: EM | Admit: 2015-09-30 | Discharge: 2015-10-04 | DRG: 896 | Disposition: A | Discharge status: Home / Self Care | Payer: Medicare Other | Attending: Internal Medicine | Admitting: Internal Medicine

## 2015-09-30 ENCOUNTER — Encounter (HOSPITAL_COMMUNITY): Payer: Self-pay

## 2015-09-30 ENCOUNTER — Emergency Department (HOSPITAL_COMMUNITY): Payer: Medicare Other

## 2015-09-30 DIAGNOSIS — E876 Hypokalemia: Secondary | ICD-10-CM | POA: Diagnosis present

## 2015-09-30 DIAGNOSIS — E860 Dehydration: Secondary | ICD-10-CM | POA: Diagnosis present

## 2015-09-30 DIAGNOSIS — R05 Cough: Secondary | ICD-10-CM

## 2015-09-30 DIAGNOSIS — E871 Hypo-osmolality and hyponatremia: Secondary | ICD-10-CM | POA: Diagnosis present

## 2015-09-30 DIAGNOSIS — F17210 Nicotine dependence, cigarettes, uncomplicated: Secondary | ICD-10-CM | POA: Diagnosis present

## 2015-09-30 DIAGNOSIS — K7010 Alcoholic hepatitis without ascites: Secondary | ICD-10-CM | POA: Diagnosis present

## 2015-09-30 DIAGNOSIS — J690 Pneumonitis due to inhalation of food and vomit: Secondary | ICD-10-CM | POA: Diagnosis present

## 2015-09-30 DIAGNOSIS — Z79899 Other long term (current) drug therapy: Secondary | ICD-10-CM

## 2015-09-30 DIAGNOSIS — R0602 Shortness of breath: Secondary | ICD-10-CM

## 2015-09-30 DIAGNOSIS — K2970 Gastritis, unspecified, without bleeding: Secondary | ICD-10-CM | POA: Diagnosis present

## 2015-09-30 DIAGNOSIS — M109 Gout, unspecified: Secondary | ICD-10-CM | POA: Diagnosis present

## 2015-09-30 DIAGNOSIS — F10230 Alcohol dependence with withdrawal, uncomplicated: Secondary | ICD-10-CM

## 2015-09-30 DIAGNOSIS — R7989 Other specified abnormal findings of blood chemistry: Secondary | ICD-10-CM | POA: Diagnosis present

## 2015-09-30 DIAGNOSIS — Z818 Family history of other mental and behavioral disorders: Secondary | ICD-10-CM | POA: Diagnosis not present

## 2015-09-30 DIAGNOSIS — G40909 Epilepsy, unspecified, not intractable, without status epilepticus: Secondary | ICD-10-CM | POA: Diagnosis present

## 2015-09-30 DIAGNOSIS — R109 Unspecified abdominal pain: Secondary | ICD-10-CM | POA: Diagnosis present

## 2015-09-30 DIAGNOSIS — R197 Diarrhea, unspecified: Secondary | ICD-10-CM | POA: Diagnosis present

## 2015-09-30 DIAGNOSIS — F1010 Alcohol abuse, uncomplicated: Secondary | ICD-10-CM | POA: Diagnosis not present

## 2015-09-30 DIAGNOSIS — K219 Gastro-esophageal reflux disease without esophagitis: Secondary | ICD-10-CM | POA: Diagnosis present

## 2015-09-30 DIAGNOSIS — J441 Chronic obstructive pulmonary disease with (acute) exacerbation: Secondary | ICD-10-CM | POA: Diagnosis present

## 2015-09-30 DIAGNOSIS — Z811 Family history of alcohol abuse and dependence: Secondary | ICD-10-CM

## 2015-09-30 DIAGNOSIS — R569 Unspecified convulsions: Secondary | ICD-10-CM

## 2015-09-30 DIAGNOSIS — F251 Schizoaffective disorder, depressive type: Secondary | ICD-10-CM | POA: Diagnosis present

## 2015-09-30 DIAGNOSIS — F10930 Alcohol use, unspecified with withdrawal, uncomplicated: Secondary | ICD-10-CM

## 2015-09-30 DIAGNOSIS — R079 Chest pain, unspecified: Secondary | ICD-10-CM | POA: Diagnosis not present

## 2015-09-30 DIAGNOSIS — K30 Functional dyspepsia: Secondary | ICD-10-CM | POA: Diagnosis present

## 2015-09-30 DIAGNOSIS — Z8249 Family history of ischemic heart disease and other diseases of the circulatory system: Secondary | ICD-10-CM | POA: Diagnosis not present

## 2015-09-30 DIAGNOSIS — Z720 Tobacco use: Secondary | ICD-10-CM | POA: Diagnosis not present

## 2015-09-30 DIAGNOSIS — N179 Acute kidney failure, unspecified: Secondary | ICD-10-CM | POA: Diagnosis present

## 2015-09-30 DIAGNOSIS — R1110 Vomiting, unspecified: Secondary | ICD-10-CM | POA: Diagnosis present

## 2015-09-30 DIAGNOSIS — F1011 Alcohol abuse, in remission: Secondary | ICD-10-CM | POA: Diagnosis present

## 2015-09-30 DIAGNOSIS — F10939 Alcohol use, unspecified with withdrawal, unspecified: Secondary | ICD-10-CM

## 2015-09-30 DIAGNOSIS — F4310 Post-traumatic stress disorder, unspecified: Secondary | ICD-10-CM | POA: Diagnosis present

## 2015-09-30 DIAGNOSIS — R1084 Generalized abdominal pain: Secondary | ICD-10-CM | POA: Diagnosis not present

## 2015-09-30 DIAGNOSIS — Z888 Allergy status to other drugs, medicaments and biological substances status: Secondary | ICD-10-CM

## 2015-09-30 DIAGNOSIS — F10239 Alcohol dependence with withdrawal, unspecified: Principal | ICD-10-CM | POA: Diagnosis present

## 2015-09-30 DIAGNOSIS — R1011 Right upper quadrant pain: Secondary | ICD-10-CM | POA: Diagnosis not present

## 2015-09-30 DIAGNOSIS — R059 Cough, unspecified: Secondary | ICD-10-CM

## 2015-09-30 LAB — RAPID URINE DRUG SCREEN, HOSP PERFORMED
Amphetamines: NOT DETECTED
Barbiturates: NOT DETECTED
Benzodiazepines: NOT DETECTED
Cocaine: NOT DETECTED
Opiates: NOT DETECTED
Tetrahydrocannabinol: POSITIVE — AB

## 2015-09-30 LAB — CBC
HCT: 45.7 % (ref 39.0–52.0)
Hemoglobin: 16 g/dL (ref 13.0–17.0)
MCH: 31.9 pg (ref 26.0–34.0)
MCHC: 35 g/dL (ref 30.0–36.0)
MCV: 91.2 fL (ref 78.0–100.0)
Platelets: 475 10*3/uL — ABNORMAL HIGH (ref 150–400)
RBC: 5.01 MIL/uL (ref 4.22–5.81)
RDW: 13.6 % (ref 11.5–15.5)
WBC: 11.3 10*3/uL — ABNORMAL HIGH (ref 4.0–10.5)

## 2015-09-30 LAB — COMPREHENSIVE METABOLIC PANEL
ALT: 26 U/L (ref 17–63)
AST: 29 U/L (ref 15–41)
Albumin: 5.2 g/dL — ABNORMAL HIGH (ref 3.5–5.0)
Alkaline Phosphatase: 119 U/L (ref 38–126)
BUN: 30 mg/dL — ABNORMAL HIGH (ref 6–20)
CO2: 28 mmol/L (ref 22–32)
Calcium: 9 mg/dL (ref 8.9–10.3)
Chloride: 73 mmol/L — ABNORMAL LOW (ref 101–111)
Creatinine, Ser: 1.7 mg/dL — ABNORMAL HIGH (ref 0.61–1.24)
GFR calc Af Amer: 56 mL/min — ABNORMAL LOW (ref >60)
GFR calc non Af Amer: 48 mL/min — ABNORMAL LOW (ref >60)
Glucose, Bld: 101 mg/dL — ABNORMAL HIGH (ref 65–99)
Potassium: 2.5 mmol/L — CL (ref 3.5–5.1)
Sodium: 125 mmol/L — ABNORMAL LOW (ref 135–145)
Total Bilirubin: 1.4 mg/dL — ABNORMAL HIGH (ref 0.3–1.2)
Total Protein: 8.5 g/dL — ABNORMAL HIGH (ref 6.5–8.1)

## 2015-09-30 LAB — LIPASE, BLOOD: Lipase: 25 U/L (ref 11–51)

## 2015-09-30 LAB — ETHANOL: Alcohol, Ethyl (B): 160 mg/dL — ABNORMAL HIGH (ref <5)

## 2015-09-30 LAB — MAGNESIUM: Magnesium: 3.4 mg/dL — ABNORMAL HIGH (ref 1.7–2.4)

## 2015-09-30 LAB — MRSA PCR SCREENING: MRSA by PCR: NEGATIVE

## 2015-09-30 MED ORDER — LORAZEPAM 1 MG PO TABS: 1.0000 mg | ORAL_TABLET | Freq: Four times a day (QID) | ORAL | Status: AC | PRN

## 2015-09-30 MED ORDER — SERTRALINE HCL 50 MG PO TABS
75.0000 mg | ORAL_TABLET | Freq: Every day | ORAL | Status: DC
  Administered 2015-09-30 – 2015-10-04 (×5): 75 mg via ORAL
  Filled 2015-09-30 (×3): qty 2
  Filled 2015-09-30 (×2): qty 1

## 2015-09-30 MED ORDER — PANTOPRAZOLE SODIUM 40 MG IV SOLR
40.0000 mg | Freq: Two times a day (BID) | INTRAVENOUS | Status: DC
  Administered 2015-09-30: 40 mg via INTRAVENOUS
  Filled 2015-09-30: qty 40

## 2015-09-30 MED ORDER — ONDANSETRON HCL 4 MG PO TABS: 4.0000 mg | ORAL_TABLET | Freq: Four times a day (QID) | ORAL | Status: DC | PRN

## 2015-09-30 MED ORDER — SODIUM CHLORIDE 0.9 % IV SOLN
INTRAVENOUS | Status: DC
  Administered 2015-09-30 – 2015-10-03 (×5): via INTRAVENOUS

## 2015-09-30 MED ORDER — ALUM & MAG HYDROXIDE-SIMETH 200-200-20 MG/5ML PO SUSP: 30.0000 mL | Freq: Four times a day (QID) | ORAL | Status: DC | PRN

## 2015-09-30 MED ORDER — PROMETHAZINE HCL 25 MG/ML IJ SOLN: 25.0000 mg | Freq: Four times a day (QID) | INTRAMUSCULAR | Status: DC | PRN

## 2015-09-30 MED ORDER — POTASSIUM CHLORIDE 10 MEQ/100ML IV SOLN
10.0000 meq | Freq: Once | INTRAVENOUS | Status: AC
  Administered 2015-09-30: 10 meq via INTRAVENOUS
  Filled 2015-09-30: qty 100

## 2015-09-30 MED ORDER — SODIUM CHLORIDE 0.9 % IV SOLN: INTRAVENOUS | Status: DC

## 2015-09-30 MED ORDER — ACETAMINOPHEN 650 MG RE SUPP: 650.0000 mg | Freq: Four times a day (QID) | RECTAL | Status: DC | PRN

## 2015-09-30 MED ORDER — ENOXAPARIN SODIUM 40 MG/0.4ML ~~LOC~~ SOLN
40.0000 mg | SUBCUTANEOUS | Status: DC
  Administered 2015-09-30 – 2015-10-04 (×5): 40 mg via SUBCUTANEOUS
  Filled 2015-09-30 (×6): qty 0.4

## 2015-09-30 MED ORDER — LORAZEPAM 2 MG/ML IJ SOLN
0.0000 mg | Freq: Two times a day (BID) | INTRAMUSCULAR | Status: AC
  Administered 2015-10-02: 2 mg via INTRAVENOUS
  Administered 2015-10-03: 1 mg via INTRAVENOUS
  Administered 2015-10-03: 2 mg via INTRAVENOUS
  Administered 2015-10-04: 1 mg via INTRAVENOUS
  Filled 2015-09-30 (×4): qty 1

## 2015-09-30 MED ORDER — ONDANSETRON HCL 4 MG/2ML IJ SOLN
4.0000 mg | Freq: Once | INTRAMUSCULAR | Status: AC
  Administered 2015-09-30: 4 mg via INTRAVENOUS
  Filled 2015-09-30: qty 2

## 2015-09-30 MED ORDER — QUETIAPINE FUMARATE 400 MG PO TABS
400.0000 mg | ORAL_TABLET | Freq: Every day | ORAL | Status: DC
  Administered 2015-09-30 – 2015-10-03 (×4): 400 mg via ORAL
  Filled 2015-09-30: qty 1
  Filled 2015-09-30 (×2): qty 4
  Filled 2015-09-30 (×3): qty 1

## 2015-09-30 MED ORDER — FOLIC ACID 1 MG PO TABS
1.0000 mg | ORAL_TABLET | Freq: Every day | ORAL | Status: DC
  Administered 2015-10-01 – 2015-10-04 (×4): 1 mg via ORAL
  Filled 2015-09-30 (×4): qty 1

## 2015-09-30 MED ORDER — THIAMINE HCL 100 MG/ML IJ SOLN
100.0000 mg | Freq: Every day | INTRAMUSCULAR | Status: DC
  Filled 2015-09-30 (×2): qty 1

## 2015-09-30 MED ORDER — VITAMIN B-1 100 MG PO TABS
100.0000 mg | ORAL_TABLET | Freq: Every day | ORAL | Status: DC
  Administered 2015-10-01 – 2015-10-04 (×4): 100 mg via ORAL
  Filled 2015-09-30 (×4): qty 1

## 2015-09-30 MED ORDER — POTASSIUM CHLORIDE CRYS ER 20 MEQ PO TBCR
40.0000 meq | EXTENDED_RELEASE_TABLET | Freq: Once | ORAL | Status: AC
  Administered 2015-09-30: 40 meq via ORAL
  Filled 2015-09-30: qty 2

## 2015-09-30 MED ORDER — THIAMINE HCL 100 MG/ML IJ SOLN
Freq: Once | INTRAVENOUS | Status: AC
  Administered 2015-09-30: 20:00:00 via INTRAVENOUS
  Filled 2015-09-30: qty 1000

## 2015-09-30 MED ORDER — ONDANSETRON HCL 4 MG/2ML IJ SOLN: 4.0000 mg | Freq: Four times a day (QID) | INTRAMUSCULAR | Status: DC | PRN

## 2015-09-30 MED ORDER — IOHEXOL 300 MG/ML  SOLN
100.0000 mL | Freq: Once | INTRAMUSCULAR | Status: AC | PRN
  Administered 2015-09-30: 80 mL via INTRAVENOUS

## 2015-09-30 MED ORDER — ADULT MULTIVITAMIN W/MINERALS CH
1.0000 | ORAL_TABLET | Freq: Every day | ORAL | Status: DC
  Administered 2015-10-01 – 2015-10-04 (×4): 1 via ORAL
  Filled 2015-09-30 (×4): qty 1

## 2015-09-30 MED ORDER — IOHEXOL 300 MG/ML  SOLN
50.0000 mL | Freq: Once | INTRAMUSCULAR | Status: AC | PRN
  Administered 2015-09-30: 50 mL via ORAL

## 2015-09-30 MED ORDER — NICOTINE 21 MG/24HR TD PT24
21.0000 mg | MEDICATED_PATCH | Freq: Every day | TRANSDERMAL | Status: DC
Start: 2015-09-30 — End: 2015-10-04
  Administered 2015-09-30 – 2015-10-04 (×5): 21 mg via TRANSDERMAL
  Filled 2015-09-30 (×5): qty 1

## 2015-09-30 MED ORDER — SODIUM CHLORIDE 0.9 % IV BOLUS (SEPSIS)
2000.0000 mL | Freq: Once | INTRAVENOUS | Status: AC
  Administered 2015-09-30: 2000 mL via INTRAVENOUS

## 2015-09-30 MED ORDER — LORAZEPAM 2 MG/ML IJ SOLN
2.0000 mg | Freq: Once | INTRAMUSCULAR | Status: AC
  Administered 2015-09-30: 2 mg via INTRAVENOUS
  Filled 2015-09-30: qty 1

## 2015-09-30 MED ORDER — LORAZEPAM 2 MG/ML IJ SOLN
1.0000 mg | Freq: Four times a day (QID) | INTRAMUSCULAR | Status: AC | PRN
  Administered 2015-10-03: 1 mg via INTRAVENOUS
  Filled 2015-09-30: qty 1

## 2015-09-30 MED ORDER — ACETAMINOPHEN 325 MG PO TABS: 650.0000 mg | ORAL_TABLET | Freq: Four times a day (QID) | ORAL | Status: DC | PRN

## 2015-09-30 MED ORDER — POTASSIUM CHLORIDE 10 MEQ/100ML IV SOLN
10.0000 meq | INTRAVENOUS | Status: AC
  Administered 2015-09-30 (×3): 10 meq via INTRAVENOUS
  Filled 2015-09-30 (×3): qty 100

## 2015-09-30 MED ORDER — LORAZEPAM 2 MG/ML IJ SOLN
0.0000 mg | Freq: Four times a day (QID) | INTRAMUSCULAR | Status: AC
  Administered 2015-09-30 – 2015-10-01 (×4): 2 mg via INTRAVENOUS
  Administered 2015-10-02: 1 mg via INTRAVENOUS
  Administered 2015-10-02: 2 mg via INTRAVENOUS
  Filled 2015-09-30 (×6): qty 1

## 2015-09-30 NOTE — ED Notes (Signed)
Dr. Zenia Resides made aware of pt's critical potassium.

## 2015-09-30 NOTE — H&P (Signed)
[facility] History and Physical  F[place]ont Laroche [id number] DOB: 16-Aug-1971 DOA: 09/30/2015   PCP: Minerva Ends, MD    Chief Complaint: vomiting, abdominal pain, seizure  HPI: [name] is a 45 y.o. male with alcohol abuse, schizoaffective disorder and gastroesophageal reflux disease. The patient states that he started drinking heavily again about 2-3 weeks ago. For about one week he has not been taking his medications. 2 days ago he began to vomit and started having abdominal distention. Despite this he was trying to drink beer. His friend noted that he had a seizure today and he was then brought to the hospital. The patient does not recall having the seizure and no one is at bedside to give a description of the episode. In the hospital he's had one episode of vomiting. He complains of feeling anxious. He does have some mild abdominal discomfort-he points to his right upper quadrant. No history of gallstones. He had some loose stools over the past 24 hours as well. He has not noted any fevers or chills. No cough or shortness of breath. No dysuria. His last drink was this morning.    General: The patient denies anorexia, fever, weight loss- he has gained weight over the past couple of months she is attributing to Seroquel Cardiac: Denies chest pain, syncope, palpitations, pedal edema  Respiratory: Denies cough, shortness of breath, wheezing GI: Admits to severe indigestion/heartburn and reflux at night, + abdominal pain, nausea, vomiting, diarrhea GU: Denies hematuria, incontinence, dysuria  Musculoskeletal: Denies arthritis  Skin: Denies suspicious skin lesions Neurologic: Denies focal weakness or numbness, change in vision Psychiatry: + depression or anxiety. Hematologic: no easy bruising or bleeding  All other systems reviewed and found to be negative.  Past Medical History  Diagnosis Date  . Psychosis   . PTSD (post-traumatic stress disorder)   . Seizure disorder  ([place])     related to etoh seizure  . Alcohol abuse   . Schizoaffective disorder   . Anxiety     at age 23  . Depression     at age 89    Past Surgical History  Procedure Laterality Date  . Foot surgery      right  . Hand surgery      Social History: Smokes 1 pack per day, drinks about a case of beer a day Lives at home. He is on disability.   Allergies  Allergen Reactions  . Wellbutrin [Bupropion] Other (See Comments)    Makes me feel like I'm have a seizure    Family history:   Family History  Problem Relation Age of Onset  . Alcoholism Mother   . CAD Father   . Depression Brother     Prior to Admission medications   Medication Sig Start Date End Date Taking? Authorizing Provider  allopurinol (ZYLOPRIM) 100 MG tablet Take 1 tablet (100 mg total) by mouth daily. 06/30/15  Yes Kerrie Buffalo, NP  gabapentin (NEURONTIN) 400 MG capsule Take 1 capsule (400 mg total) by mouth 3 (three) times daily as needed. Patient taking differently: Take 400 mg by mouth 4 (four) times daily.  06/30/15  Yes Kerrie Buffalo, NP  QUEtiapine (SEROQUEL) 400 MG tablet Take 400 mg by mouth at bedtime.   Yes Historical Provider, MD  ranitidine (ZANTAC) 150 MG tablet Take 150 mg by mouth daily as needed for heartburn.    Yes Historical Provider, MD  sertraline (ZOLOFT) 25 MG tablet Take 3 tablets (75 mg total) by mouth daily. 06/30/15  Yes Kerrie Buffalo, NP  acetaminophen-codeine (TYLENOL #3) 300-30 MG tablet Take 1-2 tablets by mouth every 8 (eight) hours as needed for moderate pain. During gout flare Patient not taking: Reported on 09/30/2015 09/13/15   Boykin Nearing, MD  indomethacin (INDOCIN) 50 MG capsule Take 1 capsule (50 mg total) by mouth 3 (three) times daily as needed for moderate pain. Patient not taking: Reported on 09/30/2015 09/13/15   Boykin Nearing, MD     Physical Exam: Filed Vitals:   09/30/15 1426 09/30/15 1600 09/30/15 1700  BP: 146/90 140/81 129/75  Pulse: 107 102 102   Temp: 98.2 F (36.8 C)    TempSrc: Oral    Resp: 18 21 28   SpO2: 92% 87% 86%     General/psych: Awake, alert oriented 3, no acute distress- flat affect HEENT: Normocephalic and Atraumatic, Mucous membranes pink                PERRLA; EOM intact; No scleral icterus,                 Nares: Patent, Oropharynx: Clear, Fair Dentition                 Neck: FROM, no cervical lymphadenopathy, thyromegaly, carotid bruit or JVD;  Breasts: deferred CHEST WALL: No tenderness  CHEST: Normal respiration, clear to auscultation bilaterally - pulse ox is 97% on room air HEART: Regular rate and rhythm; no murmurs rubs or gallops  BACK: No kyphosis or scoliosis; no CVA tenderness  GI: No Bowel Sounds, soft, mildly tender in right upper quadrant and left lower quadrant, moderately distended and tympanic, no masses, no organomegaly Rectal Exam: deferred MSK: No cyanosis, clubbing, or edema Genitalia: not examined  SKIN:  no rash or ulceration  CNS: Alert and Oriented x 4, Nonfocal exam, CN 2-12 intact  Labs on Admission:  Basic Metabolic Panel:  Recent Labs Lab 09/30/15 1529 09/30/15 1530  NA 125*  --   K 2.5*  --   CL 73*  --   CO2 28  --   GLUCOSE 101*  --   BUN 30*  --   CREATININE 1.70*  --   CALCIUM 9.0  --   MG  --  3.4*   Liver Function Tests:  Recent Labs Lab 09/30/15 1529  AST 29  ALT 26  ALKPHOS 119  BILITOT 1.4*  PROT 8.5*  ALBUMIN 5.2*    Recent Labs Lab 09/30/15 1530  LIPASE 25   No results for input(s): AMMONIA in the last 168 hours. CBC:  Recent Labs Lab 09/30/15 1500  WBC 11.3*  HGB 16.0  HCT 45.7  MCV 91.2  PLT 475*   Cardiac Enzymes: No results for input(s): CKTOTAL, CKMB, CKMBINDEX, TROPONINI in the last 168 hours.  BNP (last 3 results) No results for input(s): BNP in the last 8760 hours.  ProBNP (last 3 results) No results for input(s): PROBNP in the last 8760 hours.  CBG: No results for input(s): GLUCAP in the last 168  hours.  Radiological Exams on Admission: Dg Chest 2 View  09/30/2015  CLINICAL DATA:  45 year old with acute onset of chest pain. Patient had a seizure earlier today. He states he is going through alcohol withdrawal. EXAM: CHEST  2 VIEW COMPARISON:  06/16/2015 and earlier. FINDINGS: Cardiomediastinal silhouette unremarkable, unchanged. Mildly prominent bronchovascular markings diffusely and mild central peribronchial thickening, unchanged. Lungs otherwise clear. No localized airspace consolidation. No pleural effusions. No pneumothorax. Normal pulmonary vascularity. Visualized bony thorax intact. IMPRESSION: Stable mild  changes of chronic bronchitis and/or asthma. No acute cardiopulmonary disease. Electronically Signed   By: Evangeline Dakin M.D.   On: 09/30/2015 17:02    EKG: Independently reviewed. Have ordered EKG  Assessment/Plan Principal Problem:   Alcohol withdrawal seizure with complication -Alcohol withdrawal is likely secondary to ongoing vomiting for the past couple of days -Started CIWA scale and when necessary IV Ativan along with a banana bag  Active Problems:   Vomiting/ Abdominal pain -Distended abdomen with poor bowel sounds-tender in right upper quadrant and left lower quadrant -We will obtain CT of the abdomen to assess for cholecystitis and colitis -Symptoms may be secondary to gastritis/PUD and therefore have started twice a day PPI -Nothing by mouth except for ice chips and sips of water with meds -IV fluid hydration while nothing by mouth  Hyponatremia/hypokalemia/acute renal failure -Acute renal failure and hyponatremia likely secondary to vomiting - hyponatremia also possibly secondary to beer drinking -Receiving normal saline and IV potassium -Magnesium level is slightly elevated    Current smoker -Nicotine patch ordered    Schizoaffective disorder, depressive type  - Will resume Seroquel and Zoloft which he has not taken in a week -If vomiting improves, can  resume 3 times a day gabapentin tomorrow  Gout -Can resume allopurinol tomorrow if vomiting improves    Consulted:   Code Status: Full code  Family Communication:   DVT Prophylaxis: Lovenox  Time spent: 52 min  Henry, MD [facility]  If 7PM-7AM, please contact night-coverage [email or web address] 09/30/2015, 5:38 PM

## 2015-09-30 NOTE — ED Notes (Signed)
Pt here with alcohol withdrawal.  Pt states had seizure 2 hours ago.  Friends states he witnessed seizure.  Pt has hx of same when not drinking.  Last drink yesterday.  Not taking his psych meds

## 2015-09-30 NOTE — ED Notes (Signed)
Staffing called for sitter.   

## 2015-09-30 NOTE — ED Provider Notes (Signed)
CSN: [id number]     Arrival date & time 09/30/15  1417 History   First MD Initiated Contact with Patient 09/30/15 1511     Chief Complaint  Patient presents with  . Suicidal  . Seizures     (Consider location/radiation/quality/duration/timing/severity/associated sxs/prior Treatment) HPI Comments: Pt here after witnessed generalized tonic clonic seizure that lasted for several minutes and quit on its own Post-ictal period noted and no loss of bowel/bladder fx--no tounge injury H/o Etoh abuse ( 12-24 beers/day) and last drink was yesterday--h/o prior etoh w/drawl seizures Endorses tremors and mylagia Emesis x 3 w/o severe abd pain or bloody stool No fever or cough No tx used pta  Patient is a 45 y.o. male presenting with seizures. The history is provided by the patient.  Seizures   Past Medical History  Diagnosis Date  . Psychosis   . PTSD (post-traumatic stress disorder)   . Seizure disorder ([place])     related to etoh seizure  . Alcohol abuse   . Schizoaffective disorder   . Anxiety     at age 79  . Depression     at age 68   Past Surgical History  Procedure Laterality Date  . Foot surgery      right  . Hand surgery     Family History  Problem Relation Age of Onset  . Alcoholism Mother   . CAD Father   . Depression Brother    Social History  Substance Use Topics  . Smoking status: Current Every Day Smoker -- 1.00 packs/day for 24 years    Types: Cigarettes  . Smokeless tobacco: Never Used  . Alcohol Use: 0.0 oz/week    0 Standard drinks or equivalent per week     Comment: "I'm a binge drinker"  Last drink on November 2016    Review of Systems  Neurological: Positive for seizures.  All other systems reviewed and are negative.     Allergies  Wellbutrin  Home Medications   Prior to Admission medications   Medication Sig Start Date End Date Taking? Authorizing Provider  acetaminophen-codeine (TYLENOL #3) 300-30 MG tablet Take 1-2 tablets by mouth  every 8 (eight) hours as needed for moderate pain. During gout flare 09/13/15   Boykin Nearing, MD  allopurinol (ZYLOPRIM) 100 MG tablet Take 1 tablet (100 mg total) by mouth daily. Patient not taking: Reported on 09/13/2015 06/30/15   Kerrie Buffalo, NP  gabapentin (NEURONTIN) 400 MG capsule Take 1 capsule (400 mg total) by mouth 3 (three) times daily as needed. Patient taking differently: Take 400 mg by mouth 4 (four) times daily.  06/30/15   Kerrie Buffalo, NP  indomethacin (INDOCIN) 50 MG capsule Take 1 capsule (50 mg total) by mouth 3 (three) times daily as needed for moderate pain. 09/13/15   Josalyn Funches, MD  QUEtiapine (SEROQUEL) 400 MG tablet Take 400 mg by mouth at bedtime.    Historical Provider, MD  ranitidine (ZANTAC) 150 MG tablet Take 150 mg by mouth 2 (two) times daily.    Historical Provider, MD  sertraline (ZOLOFT) 25 MG tablet Take 3 tablets (75 mg total) by mouth daily. 06/30/15   Kerrie Buffalo, NP   BP 146/90 mmHg  Pulse 107  Temp(Src) 98.2 F (36.8 C) (Oral)  Resp 18  SpO2 92% Physical Exam  Constitutional: He is oriented to person, place, and time. He appears well-developed and well-nourished.  Non-toxic appearance. No distress.  HENT:  Head: Normocephalic and atraumatic.  Eyes: Conjunctivae, EOM and lids  are normal. Pupils are equal, round, and reactive to light.  Neck: Normal range of motion. Neck supple. No tracheal deviation present. No thyroid mass present.  Cardiovascular: Normal rate, regular rhythm and normal heart sounds.  Exam reveals no gallop.   No murmur heard. Pulmonary/Chest: Effort normal and breath sounds normal. No stridor. No respiratory distress. He has no decreased breath sounds. He has no wheezes. He has no rhonchi. He has no rales.  Abdominal: Soft. Normal appearance and bowel sounds are normal. He exhibits no distension. There is no tenderness. There is no rebound and no CVA tenderness.  Musculoskeletal: Normal range of motion. He exhibits no  edema or tenderness.  Neurological: He is alert and oriented to person, place, and time. He has normal strength. No cranial nerve deficit or sensory deficit. GCS eye subscore is 4. GCS verbal subscore is 5. GCS motor subscore is 6.  Skin: Skin is warm and dry. No abrasion and no rash noted.  Psychiatric: His behavior is normal. His affect is blunt. His speech is delayed.  Nursing note and vitals reviewed.   ED Course  Procedures (including critical care time) Labs Review Labs Reviewed  CBC  URINE RAPID DRUG SCREEN, HOSP PERFORMED  ETHANOL  COMPREHENSIVE METABOLIC PANEL    Imaging Review No results found. I have personally reviewed and evaluated these images and lab results as part of my medical decision-making.   EKG Interpretation None      MDM   Final diagnoses:  None    Patient's electrolytes noted and he was given potassium supplementation. Evidence of acute kidney injury and was bolused with IV fluids. Patient given Ativan 2 g IV push for tremors. No seizure activity noted here. Patient placed on alcohol withdrawal protocol. We'll consult hospitalist for admission  Patient reassessed multiple times and is maintaining his airway. Suspect that patient has alcohol withdrawal seizures. He also has evidence of dehydration. CRITICAL CARE Performed by: Leota Jacobsen Total critical care time: 50 minutes Critical care time was exclusive of separately billable procedures and treating other patients. Critical care was necessary to treat or prevent imminent or life-threatening deterioration. Critical care was time spent personally by me on the following activities: development of treatment plan with patient and/or surrogate as well as nursing, discussions with consultants, evaluation of patient's response to treatment, examination of patient, obtaining history from patient or surrogate, ordering and performing treatments and interventions, ordering and review of laboratory studies,  ordering and review of radiographic studies, pulse oximetry and re-evaluation of patient's condition.    Lacretia Leigh, MD 09/30/15 [phone]

## 2015-10-01 DIAGNOSIS — R1084 Generalized abdominal pain: Secondary | ICD-10-CM

## 2015-10-01 DIAGNOSIS — F1010 Alcohol abuse, uncomplicated: Secondary | ICD-10-CM

## 2015-10-01 LAB — COMPREHENSIVE METABOLIC PANEL
ALT: 22 U/L (ref 17–63)
AST: 21 U/L (ref 15–41)
Albumin: 4.1 g/dL (ref 3.5–5.0)
Alkaline Phosphatase: 94 U/L (ref 38–126)
Anion gap: 16 — ABNORMAL HIGH (ref 5–15)
BUN: 28 mg/dL — ABNORMAL HIGH (ref 6–20)
CO2: 28 mmol/L (ref 22–32)
Calcium: 8.4 mg/dL — ABNORMAL LOW (ref 8.9–10.3)
Chloride: 87 mmol/L — ABNORMAL LOW (ref 101–111)
Creatinine, Ser: 1.54 mg/dL — ABNORMAL HIGH (ref 0.61–1.24)
GFR calc Af Amer: >60 mL/min (ref >60)
GFR calc non Af Amer: 54 mL/min — ABNORMAL LOW (ref >60)
Glucose, Bld: 86 mg/dL (ref 65–99)
Potassium: 3.4 mmol/L — ABNORMAL LOW (ref 3.5–5.1)
Sodium: 131 mmol/L — ABNORMAL LOW (ref 135–145)
Total Bilirubin: 1.9 mg/dL — ABNORMAL HIGH (ref 0.3–1.2)
Total Protein: 6.8 g/dL (ref 6.5–8.1)

## 2015-10-01 LAB — CBC
HCT: 39.3 % (ref 39.0–52.0)
Hemoglobin: 13.2 g/dL (ref 13.0–17.0)
MCH: 31.7 pg (ref 26.0–34.0)
MCHC: 33.6 g/dL (ref 30.0–36.0)
MCV: 94.2 fL (ref 78.0–100.0)
Platelets: 304 10*3/uL (ref 150–400)
RBC: 4.17 MIL/uL — ABNORMAL LOW (ref 4.22–5.81)
RDW: 13.8 % (ref 11.5–15.5)
WBC: 6.2 10*3/uL (ref 4.0–10.5)

## 2015-10-01 LAB — APTT: aPTT: 32 seconds (ref 24–37)

## 2015-10-01 LAB — PROTIME-INR
INR: 0.96 (ref 0.00–1.49)
Prothrombin Time: 13 seconds (ref 11.6–15.2)

## 2015-10-01 MED ORDER — POTASSIUM CHLORIDE CRYS ER 20 MEQ PO TBCR
40.0000 meq | EXTENDED_RELEASE_TABLET | Freq: Once | ORAL | Status: AC
  Administered 2015-10-01: 40 meq via ORAL
  Filled 2015-10-01: qty 2

## 2015-10-01 MED ORDER — ALLOPURINOL 100 MG PO TABS
100.0000 mg | ORAL_TABLET | Freq: Every day | ORAL | Status: DC
  Administered 2015-10-01 – 2015-10-04 (×4): 100 mg via ORAL
  Filled 2015-10-01 (×4): qty 1

## 2015-10-01 MED ORDER — GABAPENTIN 400 MG PO CAPS
400.0000 mg | ORAL_CAPSULE | Freq: Four times a day (QID) | ORAL | Status: DC
  Administered 2015-10-01 – 2015-10-04 (×14): 400 mg via ORAL
  Filled 2015-10-01 (×16): qty 1

## 2015-10-01 MED ORDER — PANTOPRAZOLE SODIUM 40 MG PO TBEC
40.0000 mg | DELAYED_RELEASE_TABLET | Freq: Two times a day (BID) | ORAL | Status: DC
  Administered 2015-10-01 – 2015-10-04 (×7): 40 mg via ORAL
  Filled 2015-10-01 (×10): qty 1

## 2015-10-01 MED ORDER — OXYCODONE-ACETAMINOPHEN 5-325 MG PO TABS: 1.0000 | ORAL_TABLET | ORAL | Status: DC | PRN

## 2015-10-01 MED ORDER — OXYCODONE-ACETAMINOPHEN 5-325 MG PO TABS
2.0000 | ORAL_TABLET | Freq: Once | ORAL | Status: AC
  Administered 2015-10-02: 2 via ORAL
  Filled 2015-10-01: qty 2

## 2015-10-01 NOTE — Progress Notes (Signed)
[facility] Progress Note   [name]  [id number]  DOB: 1970-12-18  DOA: 09/30/2015 PCP: Minerva Ends, MD  Brief narrative: [name] is a 45 y.o. male male with alcohol abuse, schizoaffective disorder and gastroesophageal reflux disease. The patient states that he started drinking heavily again about 2-3 weeks ago. For about one week he has not been taking his medications. 2 days ago he began to vomit and started having abdominal distention. Despite this he was trying to drink beer. His friend noted that he had a seizure today and he was then brought to the hospital.   Subjective: Less nausea and less abdominal pain today. Has depression and there was some question of him being suicidal yesterday. He states to me today that he is not having any thoughts of suicide and has no intention to commit suicide.   Assessment/Plan: Principal Problem:   Alcohol withdrawal seizure with complication - cont Ativan and CIWA scale - no further seizures in the hospital  - advised strongly to stop drinking alcohol   Elevated LFTs - due to alcoholic hepatitis    Vomiting,  Abdominal pain and diarrhea - improving- CT abd/pelvis revealed a fatty liver- no other abnormalities - suspect vomiting may be related to gastritis - possibly viral gastroenteritis - cont PPI BID for now  Hypokalemia/ hyponatremia/ AKI - cont IVF and K replacement     Current smoker - nicotine patch    Schizoaffective disorder, depressive type - cont Seroquel, Neurontin, Zoloft  Gout - Allopurinol    Antibiotics: Anti-infectives    None     Code Status:     Code Status Orders        Start     Ordered   09/30/15 1656  Full code   Continuous     09/30/15 1656    Code Status History    Family Communication:  Disposition Plan: home in 2-3 days DVT prophylaxis: Lovenox Consultants:  Procedures:      Objective: Filed Weights   09/30/15 1814  Weight: 104.7 kg (230 lb 13.2  oz)    Intake/Output Summary (Last 24 hours) at 10/01/15 1052 Last data filed at 10/01/15 0500  Gross per 24 hour  Intake 740.83 ml  Output   1000 ml  Net -259.17 ml     Vitals Filed Vitals:   09/30/15 2346 10/01/15 0000 10/01/15 0400 10/01/15 0800  BP: 136/81 106/51  124/62  Pulse: 108 108  92  Temp:  97.7 F (36.5 C) 98 F (36.7 C)   TempSrc:  Oral Oral   Resp: 23 23  18   Height:      Weight:      SpO2: 88% 94%  92%    Exam:  General:  Pt is alert, not in acute distress  HEENT: No icterus, No thrush, oral mucosa moist  Cardiovascular: regular rate and rhythm, S1/S2 No murmur  Respiratory: clear to auscultation bilaterally   Abdomen: Soft, +Bowel sounds, non tender, non distended, no guarding  MSK: No cyanosis or clubbing- no pedal edema   Data Reviewed: Basic Metabolic Panel:  Recent Labs Lab 09/30/15 1529 09/30/15 1530 10/01/15 0402  NA 125*  --  131*  K 2.5*  --  3.4*  CL 73*  --  87*  CO2 28  --  28  GLUCOSE 101*  --  86  BUN 30*  --  28*  CREATININE 1.70*  --  1.54*  CALCIUM 9.0  --  8.4*  MG  --  3.4*  --    Liver Function Tests:  Recent Labs Lab 09/30/15 1529 10/01/15 0402  AST 29 21  ALT 26 22  ALKPHOS 119 94  BILITOT 1.4* 1.9*  PROT 8.5* 6.8  ALBUMIN 5.2* 4.1    Recent Labs Lab 09/30/15 1530  LIPASE 25   No results for input(s): AMMONIA in the last 168 hours. CBC:  Recent Labs Lab 09/30/15 1500 10/01/15 0402  WBC 11.3* 6.2  HGB 16.0 13.2  HCT 45.7 39.3  MCV 91.2 94.2  PLT 475* 304   Cardiac Enzymes: No results for input(s): CKTOTAL, CKMB, CKMBINDEX, TROPONINI in the last 168 hours. BNP (last 3 results) No results for input(s): BNP in the last 8760 hours.  ProBNP (last 3 results) No results for input(s): PROBNP in the last 8760 hours.  CBG: No results for input(s): GLUCAP in the last 168 hours.  Recent Results (from the past 240 hour(s))  MRSA PCR Screening     Status: None   Collection Time: 09/30/15   6:23 PM  Result Value Ref Range Status   MRSA by PCR NEGATIVE NEGATIVE Final    Comment:        The GeneXpert MRSA Assay (FDA approved for NASAL specimens only), is one component of a comprehensive MRSA colonization surveillance program. It is not intended to diagnose MRSA infection nor to guide or monitor treatment for MRSA infections.      Studies: Dg Chest 2 View  09/30/2015  CLINICAL DATA:  45 year old with acute onset of chest pain. Patient had a seizure earlier today. He states he is going through alcohol withdrawal. EXAM: CHEST  2 VIEW COMPARISON:  06/16/2015 and earlier. FINDINGS: Cardiomediastinal silhouette unremarkable, unchanged. Mildly prominent bronchovascular markings diffusely and mild central peribronchial thickening, unchanged. Lungs otherwise clear. No localized airspace consolidation. No pleural effusions. No pneumothorax. Normal pulmonary vascularity. Visualized bony thorax intact. IMPRESSION: Stable mild changes of chronic bronchitis and/or asthma. No acute cardiopulmonary disease. Electronically Signed   By: Evangeline Dakin M.D.   On: 09/30/2015 17:02   Ct Abdomen Pelvis W Contrast  09/30/2015  CLINICAL DATA:  Vomiting and abdominal distention starting 2 days ago. Drinking heavily beginning about 2-3 weeks ago. Right upper quadrant discomfort. EXAM: CT ABDOMEN AND PELVIS WITH CONTRAST TECHNIQUE: Multidetector CT imaging of the abdomen and pelvis was performed using the standard protocol following bolus administration of intravenous contrast. CONTRAST:  8mL OMNIPAQUE IOHEXOL 300 MG/ML SOLN, 44mL OMNIPAQUE IOHEXOL 300 MG/ML SOLN COMPARISON:  None. FINDINGS: Atelectasis in the lung bases.  Small cyst in the left lung base. Diffuse fatty infiltration of the liver. The gallbladder, spleen, pancreas, adrenal glands, kidneys, abdominal aorta, inferior vena cava, and retroperitoneal lymph nodes are unremarkable. Stomach, small bowel, and colon are decompressed. Contrast material  flows through to the rectum suggesting no evidence of large or small bowel obstruction. Small bowel are mostly decompressed. Colonic wall is not thickened. No free air or free fluid in the abdomen. Abdominal wall musculature appears intact. Pelvis: Prostate gland is not enlarged. Bladder wall is not thickened. No free or loculated pelvic fluid collections. Pelvic lymph nodes are not pathologically enlarged. The appendix is normal. No destructive bone lesions. IMPRESSION: No acute process demonstrated in the abdomen or pelvis. No evidence of bowel obstruction or inflammation. Diffuse fatty infiltration of the liver. Electronically Signed   By: Lucienne Capers M.D.   On: 09/30/2015 23:24    Scheduled Meds:  Scheduled Meds: . enoxaparin (LOVENOX) injection  40 mg Subcutaneous Q24H  .  folic acid  1 mg Oral Daily  . LORazepam  0-4 mg Intravenous 4 times per day   Followed by  . [START ON 10/02/2015] LORazepam  0-4 mg Intravenous Q12H  . multivitamin with minerals  1 tablet Oral Daily  . nicotine  21 mg Transdermal Daily  . pantoprazole  40 mg Oral BID  . QUEtiapine  400 mg Oral QHS  . sertraline  75 mg Oral Daily  . thiamine  100 mg Oral Daily   Or  . thiamine  100 mg Intravenous Daily   Continuous Infusions: . sodium chloride 125 mL/hr at 09/30/15 1902    Time spent on care of this patient: 40 min   [place], MD 10/01/2015, 10:52 AM  LOS: 1 day   [facility] Office  [phone] Pager - Text Page per [email or web address] If 7PM-7AM, please contact night-coverage [email or web address]

## 2015-10-01 NOTE — Progress Notes (Signed)
Per MD, patient no longer SI. MD and I both witnessed patient state he does not feel suicidal or having suicidal thoughts. Notified charge RN that patient no Personal assistant.

## 2015-10-01 NOTE — Care Management Note (Signed)
Case Management Note  Patient Details  Name: [name] MRN: [id number] Date of Birth: [birth year]  Subjective/Objective:         ciwa protocol            Action/Plan:Date: October 01, 2015 Chart reviewed for concurrent status and case management needs. Will continue to follow patient for changes and needs: [name], BSN, RN, [place]   [phone]   Expected Discharge Date:                  Expected Discharge Plan:  Home/Self Care  In-House Referral:  Clinical Social Work  Discharge planning Services  CM Consult  Post Acute Care Choice:  NA Choice offered to:  NA  DME Arranged:    DME Agency:     HH Arranged:    [place] Agency:     Status of Service:  Completed, signed off  Medicare Important Message Given:    Date Medicare IM Given:    Medicare IM give by:    Date Additional Medicare IM Given:    Additional Medicare Important Message give by:     If discussed at [place] of Stay Meetings, dates discussed:    Additional Comments:  Leeroy Cha, RN 10/01/2015, 11:51 AM

## 2015-10-02 DIAGNOSIS — N179 Acute kidney failure, unspecified: Secondary | ICD-10-CM

## 2015-10-02 DIAGNOSIS — Z720 Tobacco use: Secondary | ICD-10-CM

## 2015-10-02 DIAGNOSIS — E871 Hypo-osmolality and hyponatremia: Secondary | ICD-10-CM

## 2015-10-02 DIAGNOSIS — E876 Hypokalemia: Secondary | ICD-10-CM

## 2015-10-02 MED ORDER — OXYCODONE-ACETAMINOPHEN 5-325 MG PO TABS
1.0000 | ORAL_TABLET | Freq: Four times a day (QID) | ORAL | Status: DC | PRN
  Administered 2015-10-02 – 2015-10-04 (×6): 1 via ORAL
  Filled 2015-10-02 (×6): qty 1

## 2015-10-02 MED ORDER — POTASSIUM CHLORIDE CRYS ER 20 MEQ PO TBCR
40.0000 meq | EXTENDED_RELEASE_TABLET | ORAL | Status: AC
  Administered 2015-10-02 (×2): 40 meq via ORAL
  Filled 2015-10-02 (×2): qty 2

## 2015-10-02 MED ORDER — PREDNISONE 20 MG PO TABS
40.0000 mg | ORAL_TABLET | Freq: Every day | ORAL | Status: DC
  Administered 2015-10-02 – 2015-10-03 (×2): 40 mg via ORAL
  Filled 2015-10-02 (×3): qty 2

## 2015-10-02 NOTE — Progress Notes (Addendum)
[facility] Progress Note   [name]  [id number]  DOB: 1971/05/06  DOA: 09/30/2015 PCP: Minerva Ends, MD  Brief narrative: [name] is a 45 y.o. male male with alcohol abuse, schizoaffective disorder and gastroesophageal reflux disease. The patient states that he started drinking heavily again about 2-3 weeks ago. For about one week he has not been taking his medications. 2 days ago he began to vomit and started having abdominal distention. Despite this he was trying to drink beer but was not able to keep down the alcohol due to ongoing vomiting. His friend noted that he had a seizure at home and he was then brought to the hospital.   Subjective: Nausea has resolved and he is able to tolerate solid food. Abdominal pain is not severe as before but is still present in the right upper and left lower abdomen. No diarrhea and no fevers. He feels that his gout is acting up and his left wrist and right first toe is painful.  Assessment/Plan: Principal Problem:   Alcohol withdrawal seizure with complication -Alcohol withdrawal likely secondary to acute onset of vomiting 2 days prior to admission - cont Ativan and CIWA scale - no further seizures in the hospital  - advised strongly to stop drinking alcohol - he states that he was participating in a support group in the past and has decided that he will go back to this group to help him abstain from drinking -Transition out of the step down unit today- continue to manage alcohol withdrawal  Elevated LFTs - due to alcoholic hepatitis    Vomiting,  Abdominal pain and diarrhea - improving- CT abd/pelvis revealed a fatty liver- no other abnormalities - suspect vomiting may be related to gastritis or possibly viral gastroenteritis - cont PPI BID for now in case it is related to gastritis-the patient gives a good history of having significant reflux symptoms lately  Hypokalemia/ hyponatremia/ AKI - cont IVF and K  replacement -he is eating and drinking now, I have decreased rate of normal saline infusion    Current smoker - nicotine patch    Schizoaffective disorder, depressive type - cont Seroquel, Neurontin, Zoloft- he states that as long as he takes his medication, he feels that his psych issues remained quite stable  Acute Gout -He complains of tenderness in his left wrist and right first toe but I see no obvious swelling on exam-will give him 3 days of prednisone -Can continue Allopurinol    Antibiotics: Anti-infectives    None     Code Status:     Code Status Orders        Start     Ordered   09/30/15 1656  Full code   Continuous     09/30/15 1656    Code Status History    Family Communication:  Disposition Plan: home in 1-2 days DVT prophylaxis: Lovenox Consultants:  Procedures:      Objective: Filed Weights   09/30/15 1814  Weight: 104.7 kg (230 lb 13.2 oz)    Intake/Output Summary (Last 24 hours) at 10/02/15 1257 Last data filed at 10/02/15 1100  Gross per 24 hour  Intake 4266.67 ml  Output    950 ml  Net 3316.67 ml     Vitals Filed Vitals:   10/02/15 0600 10/02/15 0800 10/02/15 1000 10/02/15 1200  BP: 139/83 121/86 134/83 132/86  Pulse: 86 69 93 88  Temp:      TempSrc:      Resp: 18 16  25  Height:      Weight:      SpO2: 100% 98% 94% 91%    Exam:  General:  Pt is alert, not in acute distress  HEENT: No icterus, No thrush, oral mucosa moist  Cardiovascular: regular rate and rhythm, S1/S2 No murmur  Respiratory: clear to auscultation bilaterally   Abdomen: Soft, +Bowel sounds, non tender, non distended, no guarding  MSK: No cyanosis or clubbing- no pedal edema   Data Reviewed: Basic Metabolic Panel:  Recent Labs Lab 09/30/15 1529 09/30/15 1530 10/01/15 0402  NA 125*  --  131*  K 2.5*  --  3.4*  CL 73*  --  87*  CO2 28  --  28  GLUCOSE 101*  --  86  BUN 30*  --  28*  CREATININE 1.70*  --  1.54*  CALCIUM 9.0  --  8.4*  MG   --  3.4*  --    Liver Function Tests:  Recent Labs Lab 09/30/15 1529 10/01/15 0402  AST 29 21  ALT 26 22  ALKPHOS 119 94  BILITOT 1.4* 1.9*  PROT 8.5* 6.8  ALBUMIN 5.2* 4.1    Recent Labs Lab 09/30/15 1530  LIPASE 25   No results for input(s): AMMONIA in the last 168 hours. CBC:  Recent Labs Lab 09/30/15 1500 10/01/15 0402  WBC 11.3* 6.2  HGB 16.0 13.2  HCT 45.7 39.3  MCV 91.2 94.2  PLT 475* 304   Cardiac Enzymes: No results for input(s): CKTOTAL, CKMB, CKMBINDEX, TROPONINI in the last 168 hours. BNP (last 3 results) No results for input(s): BNP in the last 8760 hours.  ProBNP (last 3 results) No results for input(s): PROBNP in the last 8760 hours.  CBG: No results for input(s): GLUCAP in the last 168 hours.  Recent Results (from the past 240 hour(s))  MRSA PCR Screening     Status: None   Collection Time: 09/30/15  6:23 PM  Result Value Ref Range Status   MRSA by PCR NEGATIVE NEGATIVE Final    Comment:        The GeneXpert MRSA Assay (FDA approved for NASAL specimens only), is one component of a comprehensive MRSA colonization surveillance program. It is not intended to diagnose MRSA infection nor to guide or monitor treatment for MRSA infections.      Studies: Dg Chest 2 View  09/30/2015  CLINICAL DATA:  45 year old with acute onset of chest pain. Patient had a seizure earlier today. He states he is going through alcohol withdrawal. EXAM: CHEST  2 VIEW COMPARISON:  06/16/2015 and earlier. FINDINGS: Cardiomediastinal silhouette unremarkable, unchanged. Mildly prominent bronchovascular markings diffusely and mild central peribronchial thickening, unchanged. Lungs otherwise clear. No localized airspace consolidation. No pleural effusions. No pneumothorax. Normal pulmonary vascularity. Visualized bony thorax intact. IMPRESSION: Stable mild changes of chronic bronchitis and/or asthma. No acute cardiopulmonary disease. Electronically Signed   By: Evangeline Dakin M.D.   On: 09/30/2015 17:02   Ct Abdomen Pelvis W Contrast  09/30/2015  CLINICAL DATA:  Vomiting and abdominal distention starting 2 days ago. Drinking heavily beginning about 2-3 weeks ago. Right upper quadrant discomfort. EXAM: CT ABDOMEN AND PELVIS WITH CONTRAST TECHNIQUE: Multidetector CT imaging of the abdomen and pelvis was performed using the standard protocol following bolus administration of intravenous contrast. CONTRAST:  32mL OMNIPAQUE IOHEXOL 300 MG/ML SOLN, 88mL OMNIPAQUE IOHEXOL 300 MG/ML SOLN COMPARISON:  None. FINDINGS: Atelectasis in the lung bases.  Small cyst in the left lung base. Diffuse fatty infiltration  of the liver. The gallbladder, spleen, pancreas, adrenal glands, kidneys, abdominal aorta, inferior vena cava, and retroperitoneal lymph nodes are unremarkable. Stomach, small bowel, and colon are decompressed. Contrast material flows through to the rectum suggesting no evidence of large or small bowel obstruction. Small bowel are mostly decompressed. Colonic wall is not thickened. No free air or free fluid in the abdomen. Abdominal wall musculature appears intact. Pelvis: Prostate gland is not enlarged. Bladder wall is not thickened. No free or loculated pelvic fluid collections. Pelvic lymph nodes are not pathologically enlarged. The appendix is normal. No destructive bone lesions. IMPRESSION: No acute process demonstrated in the abdomen or pelvis. No evidence of bowel obstruction or inflammation. Diffuse fatty infiltration of the liver. Electronically Signed   By: Lucienne Capers M.D.   On: 09/30/2015 23:24    Scheduled Meds:  Scheduled Meds: . allopurinol  100 mg Oral Daily  . enoxaparin (LOVENOX) injection  40 mg Subcutaneous Q24H  . folic acid  1 mg Oral Daily  . gabapentin  400 mg Oral QID  . LORazepam  0-4 mg Intravenous 4 times per day   Followed by  . LORazepam  0-4 mg Intravenous Q12H  . multivitamin with minerals  1 tablet Oral Daily  . nicotine  21 mg  Transdermal Daily  . pantoprazole  40 mg Oral BID  . predniSONE  40 mg Oral Q breakfast  . QUEtiapine  400 mg Oral QHS  . sertraline  75 mg Oral Daily  . thiamine  100 mg Oral Daily   Or  . thiamine  100 mg Intravenous Daily   Continuous Infusions: . sodium chloride 75 mL/hr at 10/02/15 1126    Time spent on care of this patient: 86 min   [place], MD 10/02/2015, 12:57 PM  LOS: 2 days   [facility] Office  [phone] Pager - Text Page per [email or web address] If 7PM-7AM, please contact night-coverage [email or web address]

## 2015-10-02 NOTE — Progress Notes (Signed)
Patient transferred to [place] room 1505.  Oriented to room and staff, call bell and telephone.  Vitals obtained, patient assessed.  Patient is alert and oriented in no acute distress.  Will continue to monitor.

## 2015-10-03 ENCOUNTER — Inpatient Hospital Stay (HOSPITAL_COMMUNITY): Payer: Medicare Other

## 2015-10-03 DIAGNOSIS — R569 Unspecified convulsions: Secondary | ICD-10-CM

## 2015-10-03 DIAGNOSIS — J441 Chronic obstructive pulmonary disease with (acute) exacerbation: Secondary | ICD-10-CM | POA: Diagnosis present

## 2015-10-03 DIAGNOSIS — F10239 Alcohol dependence with withdrawal, unspecified: Principal | ICD-10-CM

## 2015-10-03 DIAGNOSIS — N179 Acute kidney failure, unspecified: Secondary | ICD-10-CM | POA: Diagnosis present

## 2015-10-03 DIAGNOSIS — J690 Pneumonitis due to inhalation of food and vomit: Secondary | ICD-10-CM | POA: Diagnosis present

## 2015-10-03 LAB — CBC WITH DIFFERENTIAL/PLATELET
Basophils Absolute: 0 10*3/uL (ref 0.0–0.1)
Basophils Relative: 0 %
Eosinophils Absolute: 0 10*3/uL (ref 0.0–0.7)
Eosinophils Relative: 0 %
HCT: 38.2 % — ABNORMAL LOW (ref 39.0–52.0)
Hemoglobin: 12.7 g/dL — ABNORMAL LOW (ref 13.0–17.0)
Lymphocytes Relative: 11 %
Lymphs Abs: 0.7 10*3/uL (ref 0.7–4.0)
MCH: 32.1 pg (ref 26.0–34.0)
MCHC: 33.2 g/dL (ref 30.0–36.0)
MCV: 96.5 fL (ref 78.0–100.0)
Monocytes Absolute: 0.2 10*3/uL (ref 0.1–1.0)
Monocytes Relative: 3 %
Neutro Abs: 5.7 10*3/uL (ref 1.7–7.7)
Neutrophils Relative %: 86 %
Platelets: 304 10*3/uL (ref 150–400)
RBC: 3.96 MIL/uL — ABNORMAL LOW (ref 4.22–5.81)
RDW: 13.4 % (ref 11.5–15.5)
WBC: 6.6 10*3/uL (ref 4.0–10.5)

## 2015-10-03 LAB — COMPREHENSIVE METABOLIC PANEL
ALT: 22 U/L (ref 17–63)
AST: 25 U/L (ref 15–41)
Albumin: 3.9 g/dL (ref 3.5–5.0)
Alkaline Phosphatase: 80 U/L (ref 38–126)
Anion gap: 10 (ref 5–15)
BUN: 23 mg/dL — ABNORMAL HIGH (ref 6–20)
CO2: 25 mmol/L (ref 22–32)
Calcium: 9.7 mg/dL (ref 8.9–10.3)
Chloride: 104 mmol/L (ref 101–111)
Creatinine, Ser: 1.44 mg/dL — ABNORMAL HIGH (ref 0.61–1.24)
GFR calc Af Amer: >60 mL/min (ref >60)
GFR calc non Af Amer: 59 mL/min — ABNORMAL LOW (ref >60)
Glucose, Bld: 126 mg/dL — ABNORMAL HIGH (ref 65–99)
Potassium: 4.9 mmol/L (ref 3.5–5.1)
Sodium: 139 mmol/L (ref 135–145)
Total Bilirubin: 0.5 mg/dL (ref 0.3–1.2)
Total Protein: 6.8 g/dL (ref 6.5–8.1)

## 2015-10-03 LAB — URINALYSIS, ROUTINE W REFLEX MICROSCOPIC
Bilirubin Urine: NEGATIVE
Glucose, UA: NEGATIVE mg/dL
Hgb urine dipstick: NEGATIVE
Ketones, ur: NEGATIVE mg/dL
Leukocytes, UA: NEGATIVE
Nitrite: NEGATIVE
Protein, ur: NEGATIVE mg/dL
Specific Gravity, Urine: 1.008 (ref 1.005–1.030)
pH: 7.5 (ref 5.0–8.0)

## 2015-10-03 LAB — LIPASE, BLOOD: Lipase: 23 U/L (ref 11–51)

## 2015-10-03 MED ORDER — PREDNISONE 20 MG PO TABS
30.0000 mg | ORAL_TABLET | Freq: Every day | ORAL | Status: AC
  Administered 2015-10-04: 30 mg via ORAL
  Filled 2015-10-03: qty 1

## 2015-10-03 MED ORDER — LEVOFLOXACIN IN D5W 750 MG/150ML IV SOLN
750.0000 mg | INTRAVENOUS | Status: DC
  Administered 2015-10-04: 750 mg via INTRAVENOUS
  Filled 2015-10-03: qty 150

## 2015-10-03 MED ORDER — LEVOFLOXACIN IN D5W 500 MG/100ML IV SOLN
500.0000 mg | INTRAVENOUS | Status: DC
  Administered 2015-10-03: 500 mg via INTRAVENOUS
  Filled 2015-10-03: qty 100

## 2015-10-03 NOTE — Progress Notes (Signed)
[facility] PROGRESS NOTE  [name] [id number] DOB: 08-19-1970 DOA: 09/30/2015 PCP: Minerva Ends, MD  Summary 10/03/15: I have seen and examined [name] and bedside and reviewed his chart. [name] is a pleasant 45 year old male with history of alcoholism/tobacco dependency/COPD/schizoaffective disorder who came in with multiple complaints including vomiting/abdominal pain/cough with question of seizure. He was found to have acute kidney injury resulting from dehydration, COPD exacerbation possibly related to aspiration pneumonia suggested by repeat chest x-ray today. He still feels weak and has cough. We'll therefore continue hydration/systemic steroids/bronchodilators, incentive spirometry and add antibiotics. Plan Alcohol withdrawal seizure with complication (HCC)/History of alcohol abuse  No breakthrough seizures  Monitor on Neurontin  Folic acid/thiamine Vomiting and diarrhea/Abdominal pain/AKI (acute kidney injury) ([place])  Hydration COPD exacerbation (HCC)/Aspiration pneumonia (HCC)/Current smoker  Bronchodilators/incentive spirometry/nicotine patch/prednisone  Levaquin Schizoaffective disorder, depressive type ([place])  Appears stable  Continue current medications  Code Status: Full code Family Communication: None in bedside Disposition Plan: Home in the next 1-2 days if clinical condition improves   Consultants:  None  Procedures:  None  Antibiotics:  Levaquin 10/03/2015>>  HPI/Subjective: Feels better but still sick. Coughing.  Objective: Filed Vitals:   10/03/15 0523 10/03/15 1500  BP: 144/87 143/84  Pulse: 70 80  Temp: 97.6 F (36.4 C) 97.9 F (36.6 C)  Resp: 18 18    Intake/Output Summary (Last 24 hours) at 10/03/15 1844 Last data filed at 10/03/15 1822  Gross per 24 hour  Intake   2010 ml  Output    200 ml  Net   1810 ml   Filed Weights   09/30/15 1814 10/02/15 1801  Weight: 104.7 kg (230 lb 13.2 oz) 104.5 kg  (230 lb 6.1 oz)    Exam:   General:  Comfortable at rest.  Cardiovascular: S1-S2 normal. No murmurs. Pulse regular.  Respiratory: Good air entry bilaterally. No rhonchi or rales. Scant wheezing bilaterally.  Abdomen: Soft and nontender. Normal bowel sounds. No organomegaly.  Musculoskeletal: No pedal edema   Neurological: Intact  Data Reviewed: Basic Metabolic Panel:  Recent Labs Lab 09/30/15 1529 09/30/15 1530 10/01/15 0402 10/03/15 1516  NA 125*  --  131* 139  K 2.5*  --  3.4* 4.9  CL 73*  --  87* 104  CO2 28  --  28 25  GLUCOSE 101*  --  86 126*  BUN 30*  --  28* 23*  CREATININE 1.70*  --  1.54* 1.44*  CALCIUM 9.0  --  8.4* 9.7  MG  --  3.4*  --   --    Liver Function Tests:  Recent Labs Lab 09/30/15 1529 10/01/15 0402 10/03/15 1516  AST 29 21 25   ALT 26 22 22   ALKPHOS 119 94 80  BILITOT 1.4* 1.9* 0.5  PROT 8.5* 6.8 6.8  ALBUMIN 5.2* 4.1 3.9    Recent Labs Lab 09/30/15 1530 10/03/15 1516  LIPASE 25 23   No results for input(s): AMMONIA in the last 168 hours. CBC:  Recent Labs Lab 09/30/15 1500 10/01/15 0402 10/03/15 1516  WBC 11.3* 6.2 6.6  NEUTROABS  --   --  5.7  HGB 16.0 13.2 12.7*  HCT 45.7 39.3 38.2*  MCV 91.2 94.2 96.5  PLT 475* 304 304   Cardiac Enzymes: No results for input(s): CKTOTAL, CKMB, CKMBINDEX, TROPONINI in the last 168 hours. BNP (last 3 results) No results for input(s): BNP in the last 8760 hours.  ProBNP (last 3 results) No results for input(s): PROBNP in  the last 8760 hours.  CBG: No results for input(s): GLUCAP in the last 168 hours.  Recent Results (from the past 240 hour(s))  MRSA PCR Screening     Status: None   Collection Time: 09/30/15  6:23 PM  Result Value Ref Range Status   MRSA by PCR NEGATIVE NEGATIVE Final    Comment:        The GeneXpert MRSA Assay (FDA approved for NASAL specimens only), is one component of a comprehensive MRSA colonization surveillance program. It is not intended to  diagnose MRSA infection nor to guide or monitor treatment for MRSA infections.      Studies: Dg Chest Port 1 View  10/03/2015  CLINICAL DATA:  Cough and shortness of breath for the past several days, current smoker. EXAM: PORTABLE CHEST 1 VIEW COMPARISON:  PA and lateral chest x-ray of September 30, 2015. FINDINGS: The lungs are adequately inflated. There is subtle increased density lateral to the left cardiac apex. There is no significant pleural effusion. There is no pneumothorax or pneumomediastinum. The heart is top-normal in size. The pulmonary vascularity is normal. The observed bony thorax is unremarkable. IMPRESSION: Subsegmental atelectasis in the lingula or left lower lobe. There is no alveolar pneumonia. A follow-up PA and lateral chest x-ray following anticipated antibiotic therapy would be useful to assure clearing. Electronically Signed   By: David  [place] M.D.   On: 10/03/2015 15:43    Scheduled Meds: . allopurinol  100 mg Oral Daily  . enoxaparin (LOVENOX) injection  40 mg Subcutaneous Q24H  . folic acid  1 mg Oral Daily  . gabapentin  400 mg Oral QID  . [START ON 10/04/2015] levofloxacin (LEVAQUIN) IV  750 mg Intravenous Q24H  . LORazepam  0-4 mg Intravenous Q12H  . multivitamin with minerals  1 tablet Oral Daily  . nicotine  21 mg Transdermal Daily  . pantoprazole  40 mg Oral BID  . [START ON 10/04/2015] predniSONE  30 mg Oral Q breakfast  . QUEtiapine  400 mg Oral QHS  . sertraline  75 mg Oral Daily  . thiamine  100 mg Oral Daily   Or  . thiamine  100 mg Intravenous Daily   Continuous Infusions: . sodium chloride 75 mL/hr at 10/03/15 1355     Time spent: 25 minutes    [name]  [facility] Pager [phone]. If 7PM-7AM, please contact night-coverage at [email or web address], password [facility] 10/03/2015, 6:44 PM  LOS: 3 days

## 2015-10-03 NOTE — Care Management Important Message (Signed)
Important Message  Patient Details  Name: [name] MRN: [id number] Date of Birth: 1971-05-27   Medicare Important Message Given:  Yes    Camillo Flaming 10/03/2015, 11:35 AMImportant Message  Patient Details  Name: [name] MRN: [id number] Date of Birth: Jul 05, 1971   Medicare Important Message Given:  Yes    Camillo Flaming 10/03/2015, 11:35 AM

## 2015-10-04 LAB — URINE CULTURE: Culture: 1000

## 2015-10-04 LAB — BASIC METABOLIC PANEL
Anion gap: 9 (ref 5–15)
BUN: 26 mg/dL — ABNORMAL HIGH (ref 6–20)
CO2: 24 mmol/L (ref 22–32)
Calcium: 9.1 mg/dL (ref 8.9–10.3)
Chloride: 106 mmol/L (ref 101–111)
Creatinine, Ser: 1.29 mg/dL — ABNORMAL HIGH (ref 0.61–1.24)
GFR calc Af Amer: >60 mL/min (ref >60)
GFR calc non Af Amer: >60 mL/min (ref >60)
Glucose, Bld: 97 mg/dL (ref 65–99)
Potassium: 4.1 mmol/L (ref 3.5–5.1)
Sodium: 139 mmol/L (ref 135–145)

## 2015-10-04 LAB — CBC
HCT: 37.4 % — ABNORMAL LOW (ref 39.0–52.0)
Hemoglobin: 12.1 g/dL — ABNORMAL LOW (ref 13.0–17.0)
MCH: 31.4 pg (ref 26.0–34.0)
MCHC: 32.4 g/dL (ref 30.0–36.0)
MCV: 97.1 fL (ref 78.0–100.0)
Platelets: 257 10*3/uL (ref 150–400)
RBC: 3.85 MIL/uL — ABNORMAL LOW (ref 4.22–5.81)
RDW: 13.7 % (ref 11.5–15.5)
WBC: 7.7 10*3/uL (ref 4.0–10.5)

## 2015-10-04 MED ORDER — LEVOFLOXACIN 750 MG PO TABS: 750.0000 mg | ORAL_TABLET | Freq: Every day | ORAL | Status: DC

## 2015-10-04 MED ORDER — LORAZEPAM 0.5 MG PO TABS
0.5000 mg | ORAL_TABLET | Freq: Once | ORAL | Status: AC
  Administered 2015-10-04: 0.5 mg via ORAL
  Filled 2015-10-04: qty 1

## 2015-10-04 MED ORDER — PANTOPRAZOLE SODIUM 40 MG PO TBEC: 40.0000 mg | DELAYED_RELEASE_TABLET | Freq: Two times a day (BID) | ORAL | Status: DC

## 2015-10-04 NOTE — Progress Notes (Signed)
Date: October 04, 2015 Chart reviewed for concurrent status and case management needs. Will continue to follow patient for changes and needs: Rhonda Davis, BSN, RN, CCM   [phone] 

## 2015-10-04 NOTE — Discharge Summary (Signed)
[name], is a 45 y.o. male  DOB [birth year]  MRN [id number].  Admission date:  09/30/2015  Admitting Physician  Debbe Odea, MD  Discharge Date:  10/04/2015   Primary MD  Minerva Ends, MD  Recommendations for primary care physician for things to follow:  Follow renal function  Admission Diagnosis   Vomiting [R11.10] SOB (shortness of breath) [R06.02] Alcohol withdrawal seizure, uncomplicated (HCC) A999333   Discharge Diagnosis  Vomiting [R11.10] SOB (shortness of breath) [R06.02] Alcohol withdrawal seizure, uncomplicated (HCC) [id number]   Principal Problem:   Alcohol withdrawal seizure with complication (HCC) Active Problems:   Vomiting and diarrhea   Abdominal pain   COPD exacerbation (HCC)   AKI (acute kidney injury) ([place])   Current smoker   Schizoaffective disorder, depressive type ([place])   History of alcohol abuse   Aspiration pneumonia [facility])       [name] is a pleasant 45 year old male with history of alcoholism/tobacco dependency/COPD/schizoaffective disorder who came in with multiple complaints including vomiting/abdominal pain/cough with question of seizure. He was found to have acute kidney injury resulting from dehydration, COPD exacerbation possibly related to aspiration pneumonia suggested by abnormal repeat chest x-ray. He does not appear to have had an alcohol withdrawal seizure. He was given IVF and Levaquin whose course he should complete. His renal function needs further follow up. He is eager to go home to follow with his PCP.  Discharge Condition Stable.  Consults obtained  None  Follow UP  Pcp   Discharge Instructions  and  Discharge Medications  Discharge Instructions    Diet - low sodium heart healthy    Complete by:  As directed      Increase activity slowly    Complete by:  As directed             Medication List    STOP taking these medications        ranitidine 150 MG tablet  Commonly known as:  ZANTAC      TAKE these medications        allopurinol 100 MG tablet  Commonly known as:  ZYLOPRIM  Take 1 tablet (100 mg total) by mouth daily.     gabapentin 400 MG capsule  Commonly known as:  NEURONTIN  Take 1 capsule (400 mg total) by mouth 3 (three) times daily as needed.     levofloxacin 750 MG tablet  Commonly known as:  LEVAQUIN  Take 1 tablet (750 mg total) by mouth daily.     pantoprazole 40 MG tablet  Commonly known as:  PROTONIX  Take 1 tablet (40 mg total) by mouth 2 (two) times daily.     QUEtiapine 400 MG tablet  Commonly known as:  SEROQUEL  Take 400 mg by mouth at bedtime.     sertraline 25 MG tablet  Commonly known as:  ZOLOFT  Take 3 tablets (75 mg total) by mouth daily.        Diet and Activity recommendation: See Discharge Instructions above  Major procedures and Radiology Reports - PLEASE review detailed and final reports for all details, in brief -    Dg Chest 2 View  09/30/2015  CLINICAL DATA:  45 year old with acute onset of chest pain. Patient had a seizure earlier today. He states he is going through alcohol withdrawal. EXAM: CHEST  2 VIEW COMPARISON:  06/16/2015 and earlier. FINDINGS: Cardiomediastinal silhouette unremarkable, unchanged. Mildly prominent bronchovascular markings diffusely and mild central peribronchial thickening, unchanged. Lungs otherwise clear. No localized airspace consolidation. No pleural effusions. No pneumothorax. Normal pulmonary vascularity. Visualized bony thorax intact. IMPRESSION: Stable mild changes of chronic bronchitis and/or asthma. No acute cardiopulmonary disease. Electronically Signed   By: Evangeline Dakin M.D.   On: 09/30/2015 17:02   Ct Abdomen Pelvis W Contrast  09/30/2015  CLINICAL DATA:  Vomiting and abdominal distention starting  2 days ago. Drinking heavily beginning about 2-3 weeks ago. Right upper quadrant discomfort. EXAM: CT ABDOMEN AND PELVIS WITH CONTRAST TECHNIQUE: Multidetector CT imaging of the abdomen and pelvis was performed using the standard protocol following bolus administration of intravenous contrast. CONTRAST:  74mL OMNIPAQUE IOHEXOL 300 MG/ML SOLN, 58mL OMNIPAQUE IOHEXOL 300 MG/ML SOLN COMPARISON:  None. FINDINGS: Atelectasis in the lung bases.  Small cyst in the left lung base. Diffuse fatty infiltration of the liver. The gallbladder, spleen, pancreas, adrenal glands, kidneys, abdominal aorta, inferior vena cava, and retroperitoneal lymph nodes are unremarkable. Stomach, small bowel, and colon are decompressed. Contrast material flows through to the rectum suggesting no evidence of large or small bowel obstruction. Small bowel are mostly decompressed. Colonic wall is not thickened. No free air or free fluid in the abdomen. Abdominal wall musculature appears intact. Pelvis: Prostate gland is not enlarged. Bladder wall is not thickened. No free or loculated pelvic fluid collections. Pelvic lymph nodes are not pathologically enlarged. The appendix is normal. No destructive bone lesions. IMPRESSION: No acute process demonstrated in the abdomen or pelvis. No evidence of bowel obstruction or inflammation. Diffuse fatty infiltration of the liver. Electronically Signed   By: Lucienne Capers M.D.   On: 09/30/2015 23:24   Dg Chest Port 1 View  10/03/2015  CLINICAL DATA:  Cough and shortness of breath for the past several days, current smoker. EXAM: PORTABLE CHEST 1 VIEW COMPARISON:  PA and lateral chest x-ray of September 30, 2015. FINDINGS: The lungs are adequately inflated. There is subtle increased density lateral to the left cardiac apex. There is no significant pleural effusion. There is no pneumothorax or pneumomediastinum. The heart is top-normal in size. The pulmonary vascularity is normal. The observed bony thorax is  unremarkable. IMPRESSION: Subsegmental atelectasis in the lingula or left lower lobe. There is no alveolar pneumonia. A follow-up PA and lateral chest x-ray following anticipated antibiotic therapy would be useful to assure clearing. Electronically Signed   By: David  Martinique M.D.   On: 10/03/2015 15:43    Micro Results   Recent Results (from the past 240 hour(s))  MRSA PCR Screening     Status: None   Collection Time: 09/30/15  6:23 PM  Result Value Ref Range Status   MRSA by PCR NEGATIVE NEGATIVE Final    Comment:        The GeneXpert MRSA Assay (FDA approved for NASAL specimens only), is one component of a comprehensive MRSA colonization surveillance program. It is not intended to diagnose MRSA infection nor to guide or monitor treatment for MRSA infections.   Culture, Urine     Status: None   Collection Time: 10/03/15  2:38 PM  Result Value Ref Range Status   Specimen Description URINE, RANDOM  Final   Special Requests NONE  Final   Culture   Final    1,000 COLONIES/mL INSIGNIFICANT GROWTH Performed at [facility]    Report Status 10/04/2015 FINAL  Final       Today   Subjective:   Oniel Karen today has no headache,no chest abdominal pain,no new weakness tingling or numbness, feels much better wants to go home today.   Objective:   Blood pressure 154/92, pulse 66, temperature 98 F (36.7 C), temperature source Oral, resp. rate 16, height 6' (1.829 m), weight 104.5 kg (230 lb 6.1 oz), SpO2 97 %.   Intake/Output Summary (Last 24 hours) at 10/04/15 1809 Last data filed at 10/04/15 1341  Gross per 24 hour  Intake   1320 ml  Output      0 ml  Net   1320 ml    Exam Awake Alert, Oriented x 3, No new F.N deficits, Normal affect [place].AT,PERRAL Supple Neck,No JVD, No cervical lymphadenopathy appriciated.  Symmetrical Chest wall movement, Good air movement bilaterally, CTAB RRR,No Gallops,Rubs or new Murmurs, No Parasternal Heave +ve B.Sounds, Abd Soft,  Non tender, No organomegaly appriciated, No rebound -guarding or rigidity. No Cyanosis, Clubbing or edema, No new Rash or bruise  Data Review   CBC w Diff: Lab Results  Component Value Date   WBC 7.7 10/04/2015   HGB 12.1* 10/04/2015   HCT 37.4* 10/04/2015   PLT 257 10/04/2015   LYMPHOPCT 11 10/03/2015   MONOPCT 3 10/03/2015   EOSPCT 0 10/03/2015   BASOPCT 0 10/03/2015    CMP: Lab Results  Component Value Date   NA 139 10/04/2015   K 4.1 10/04/2015   CL 106 10/04/2015   CO2 24 10/04/2015   BUN 26* 10/04/2015   CREATININE 1.29* 10/04/2015   CREATININE 0.93 09/13/2015   PROT 6.8 10/03/2015   ALBUMIN 3.9 10/03/2015   BILITOT 0.5 10/03/2015   ALKPHOS 80 10/03/2015   AST 25 10/03/2015   ALT 22 10/03/2015  .   Total Time in preparing paper work, data evaluation and todays exam - 20  minutes  Koree Staheli M.D on 10/04/2015 at 6:09 PM  [facility] Office  [phone]

## 2015-10-10 ENCOUNTER — Emergency Department (HOSPITAL_COMMUNITY)
Admission: EM | Admit: 2015-10-10 | Discharge: 2015-10-11 | Disposition: A | Discharge status: Home / Self Care | Payer: Medicare Other | Attending: Emergency Medicine | Admitting: Emergency Medicine

## 2015-10-10 ENCOUNTER — Encounter (HOSPITAL_COMMUNITY): Payer: Self-pay

## 2015-10-10 DIAGNOSIS — F29 Unspecified psychosis not due to a substance or known physiological condition: Secondary | ICD-10-CM | POA: Diagnosis not present

## 2015-10-10 DIAGNOSIS — F1210 Cannabis abuse, uncomplicated: Secondary | ICD-10-CM | POA: Diagnosis not present

## 2015-10-10 DIAGNOSIS — F1110 Opioid abuse, uncomplicated: Secondary | ICD-10-CM | POA: Insufficient documentation

## 2015-10-10 DIAGNOSIS — F329 Major depressive disorder, single episode, unspecified: Secondary | ICD-10-CM | POA: Diagnosis not present

## 2015-10-10 DIAGNOSIS — F419 Anxiety disorder, unspecified: Secondary | ICD-10-CM | POA: Diagnosis not present

## 2015-10-10 DIAGNOSIS — F10239 Alcohol dependence with withdrawal, unspecified: Secondary | ICD-10-CM | POA: Diagnosis not present

## 2015-10-10 DIAGNOSIS — F17210 Nicotine dependence, cigarettes, uncomplicated: Secondary | ICD-10-CM | POA: Diagnosis not present

## 2015-10-10 DIAGNOSIS — F4310 Post-traumatic stress disorder, unspecified: Secondary | ICD-10-CM | POA: Insufficient documentation

## 2015-10-10 DIAGNOSIS — F1010 Alcohol abuse, uncomplicated: Secondary | ICD-10-CM | POA: Insufficient documentation

## 2015-10-10 DIAGNOSIS — Z79899 Other long term (current) drug therapy: Secondary | ICD-10-CM | POA: Diagnosis not present

## 2015-10-10 DIAGNOSIS — R45851 Suicidal ideations: Secondary | ICD-10-CM | POA: Diagnosis present

## 2015-10-10 DIAGNOSIS — F259 Schizoaffective disorder, unspecified: Secondary | ICD-10-CM | POA: Diagnosis not present

## 2015-10-10 LAB — COMPREHENSIVE METABOLIC PANEL
ALT: 24 U/L (ref 17–63)
AST: 21 U/L (ref 15–41)
Albumin: 4.9 g/dL (ref 3.5–5.0)
Alkaline Phosphatase: 96 U/L (ref 38–126)
Anion gap: 16 — ABNORMAL HIGH (ref 5–15)
BUN: 12 mg/dL (ref 6–20)
CO2: 21 mmol/L — ABNORMAL LOW (ref 22–32)
Calcium: 9.7 mg/dL (ref 8.9–10.3)
Chloride: 99 mmol/L — ABNORMAL LOW (ref 101–111)
Creatinine, Ser: 0.97 mg/dL (ref 0.61–1.24)
GFR calc Af Amer: >60 mL/min (ref >60)
GFR calc non Af Amer: >60 mL/min (ref >60)
Glucose, Bld: 103 mg/dL — ABNORMAL HIGH (ref 65–99)
Potassium: 4.2 mmol/L (ref 3.5–5.1)
Sodium: 136 mmol/L (ref 135–145)
Total Bilirubin: 0.3 mg/dL (ref 0.3–1.2)
Total Protein: 8.4 g/dL — ABNORMAL HIGH (ref 6.5–8.1)

## 2015-10-10 LAB — RAPID URINE DRUG SCREEN, HOSP PERFORMED
Amphetamines: NOT DETECTED
Barbiturates: NOT DETECTED
Benzodiazepines: NOT DETECTED
Cocaine: NOT DETECTED
Opiates: POSITIVE — AB
Tetrahydrocannabinol: POSITIVE — AB

## 2015-10-10 LAB — CBC
HCT: 47.3 % (ref 39.0–52.0)
Hemoglobin: 16.4 g/dL (ref 13.0–17.0)
MCH: 32.3 pg (ref 26.0–34.0)
MCHC: 34.7 g/dL (ref 30.0–36.0)
MCV: 93.1 fL (ref 78.0–100.0)
Platelets: 377 10*3/uL (ref 150–400)
RBC: 5.08 MIL/uL (ref 4.22–5.81)
RDW: 14.1 % (ref 11.5–15.5)
WBC: 7.8 10*3/uL (ref 4.0–10.5)

## 2015-10-10 LAB — ETHANOL: Alcohol, Ethyl (B): 56 mg/dL — ABNORMAL HIGH (ref <5)

## 2015-10-10 LAB — SALICYLATE LEVEL: Salicylate Lvl: <4 mg/dL (ref 2.8–30.0)

## 2015-10-10 LAB — ACETAMINOPHEN LEVEL: Acetaminophen (Tylenol), Serum: <10 ug/mL — ABNORMAL LOW (ref 10–30)

## 2015-10-10 MED ORDER — LORAZEPAM 1 MG PO TABS
2.0000 mg | ORAL_TABLET | Freq: Once | ORAL | Status: AC
  Administered 2015-10-10: 2 mg via ORAL
  Filled 2015-10-10: qty 2

## 2015-10-10 MED ORDER — LORAZEPAM 1 MG PO TABS
0.0000 mg | ORAL_TABLET | Freq: Four times a day (QID) | ORAL | Status: DC
  Administered 2015-10-10 – 2015-10-11 (×5): 1 mg via ORAL
  Filled 2015-10-10 (×5): qty 1

## 2015-10-10 MED ORDER — THIAMINE HCL 100 MG/ML IJ SOLN: 100.0000 mg | Freq: Every day | INTRAMUSCULAR | Status: DC

## 2015-10-10 MED ORDER — ALUM & MAG HYDROXIDE-SIMETH 200-200-20 MG/5ML PO SUSP: 30.0000 mL | ORAL | Status: DC | PRN

## 2015-10-10 MED ORDER — LORAZEPAM 1 MG PO TABS: 0.0000 mg | ORAL_TABLET | Freq: Two times a day (BID) | ORAL | Status: DC

## 2015-10-10 MED ORDER — ONDANSETRON HCL 4 MG PO TABS: 4.0000 mg | ORAL_TABLET | Freq: Three times a day (TID) | ORAL | Status: DC | PRN

## 2015-10-10 MED ORDER — PROMETHAZINE HCL 25 MG PO TABS
25.0000 mg | ORAL_TABLET | Freq: Once | ORAL | Status: AC
  Administered 2015-10-10: 25 mg via ORAL
  Filled 2015-10-10: qty 1

## 2015-10-10 MED ORDER — ZOLPIDEM TARTRATE 5 MG PO TABS
5.0000 mg | ORAL_TABLET | Freq: Every evening | ORAL | Status: DC | PRN
  Administered 2015-10-10: 5 mg via ORAL
  Filled 2015-10-10: qty 1

## 2015-10-10 MED ORDER — VITAMIN B-1 100 MG PO TABS
100.0000 mg | ORAL_TABLET | Freq: Every day | ORAL | Status: DC
  Administered 2015-10-10 – 2015-10-11 (×2): 100 mg via ORAL
  Filled 2015-10-10 (×2): qty 1

## 2015-10-10 MED ORDER — NICOTINE 21 MG/24HR TD PT24
21.0000 mg | MEDICATED_PATCH | Freq: Every day | TRANSDERMAL | Status: DC
  Administered 2015-10-10 – 2015-10-11 (×2): 21 mg via TRANSDERMAL
  Filled 2015-10-10 (×2): qty 1

## 2015-10-10 MED ORDER — ACETAMINOPHEN 325 MG PO TABS
650.0000 mg | ORAL_TABLET | ORAL | Status: DC | PRN
  Administered 2015-10-10 – 2015-10-11 (×2): 650 mg via ORAL
  Filled 2015-10-10 (×2): qty 2

## 2015-10-10 NOTE — ED Notes (Addendum)
Per EMS, pt from home.  Pt c/o suicidal thoughts and alcohol abuse.  Psych hx.  Pt seeing vampires in house and that they were going to kill him.  Vitals: 144/92, hr 119, 95%, resp 22.  He called for assist.  Voluntary.  Alert and Oriented.  Last drink was last night.

## 2015-10-10 NOTE — BH Assessment (Signed)
Received call from [place] at [facility] who reported that pt has been accepted for treatment under the care of Dr. Macky Lower. Nurse report can be called to [phone]. Pt can be transported after 8am.

## 2015-10-10 NOTE — ED Notes (Signed)
Patient in bed resting at the beginning of the shift. His mood and affects sad and depressed. He reported having withdrawals from alcohol. He endorsed voices telling him to hurt himself. Patient also endorsed having cold chills and sweats. Writer encouraged and supported patient. Q 15 minute check continues as ordered for safety.

## 2015-10-10 NOTE — BH Assessment (Addendum)
Assessment Note  [name] is an 45 y.o. male with history of Schizoaffective, Depression, Anxiety, and  Alcohol. Patient presents to [facility] with complaints of psychotic symptoms and alcohol binges. Patient sts that he see's vampires in his home. He also hears voices telling him to "Gwenlyn Found yourself" and "Get it over with because you are worthless". Patient has a history of similar psychotic symptoms on/off for several years. He is also suicidal with a plan to cut himself or OD. Patient has tried to commit suicide several times in the past. The previous triggers were related to stress. Patient does not identify and specific triggers today. Patient denies HI. He reports ongoing alcohol binges for several months. He is currently on a drinking binged drinking 1 case of beer daily. Last drink was yesterday. He also smokes THC occasionally. Patient reports withdrawal symptoms (cramps and agitation). He also has a history of seizures. Last seizure was 1 week and he was hospitalized in ICU. Patient reports several previous inpatient hospitalizations. His outpatient mental health provider is with [facility].   Diagnosis: Schizoaffective, Depression, Anxiety, Alcohol   Past Medical History:  Past Medical History  Diagnosis Date  . Psychosis   . PTSD (post-traumatic stress disorder)   . Seizure disorder ([place])     related to etoh seizure  . Alcohol abuse   . Schizoaffective disorder   . Anxiety     at age 48  . Depression     at age 89    Past Surgical History  Procedure Laterality Date  . Foot surgery      right  . Hand surgery      Family History:  Family History  Problem Relation Age of Onset  . Alcoholism Mother   . CAD Father   . Depression Brother     Social History:  reports that he has been smoking Cigarettes.  He has a 24 pack-year smoking history. He has never used smokeless tobacco. He reports that he drinks alcohol. He reports that he uses illicit drugs (Marijuana) about once per  week.  Additional Social History:  Alcohol / Drug Use Pain Medications: SEE MAR Prescriptions: SEE MAR Over the Counter: SEE MAR History of alcohol / drug use?: Yes Longest period of sobriety (when/how long): "several weeks" Negative Consequences of Use: Financial, Personal relationships Withdrawal Symptoms: Seizures, Agitation, Cramps Onset of Seizures: unk Date of most recent seizure: "last week"; patient sts that he was hospitalized for 5 days 1 week ago due to a seizure Substance #1 Name of Substance 1: Alcohol  1 - Age of First Use: 45 yrs old  1 - Amount (size/oz): binge drinks; 1 case  1 - Frequency: daily for the past several months with the expception of 1 week..patient was in IVC 1 - Duration: on-going  1 - Last Use / Amount: last night  Substance #2 Name of Substance 2: THC 2 - Age of First Use: 45 yrs old  2 - Amount (size/oz): varies 2 - Frequency: occasional 2 - Duration: on-going  2 - Last Use / Amount: "few weeks ago"  CIWA: CIWA-Ar BP: 148/99 mmHg Pulse Rate: 107 Nausea and Vomiting: mild nausea with no vomiting Tactile Disturbances: very mild itching, pins and needles, burning or numbness Tremor: two Auditory Disturbances: not present Paroxysmal Sweats: no sweat visible Visual Disturbances: not present Anxiety: three Headache, Fullness in Head: none present Agitation: somewhat more than normal activity Orientation and Clouding of Sensorium: oriented and can do serial additions CIWA-Ar Total: 8 COWS:  Allergies:  Allergies  Allergen Reactions  . Wellbutrin [Bupropion] Other (See Comments)    Makes me feel like I'm have a seizure    Home Medications:  (Not in a hospital admission)  OB/GYN Status:  No LMP for male patient.  General Assessment Data Location of Assessment: [facility] ED TTS Assessment: In system Is this a Tele or Face-to-Face Assessment?: Face-to-Face Is this an Initial Assessment or a Re-assessment for this encounter?: Initial  Assessment Marital status: Divorced [place] name:  (n/a) Is patient pregnant?: No Pregnancy Status: No Living Arrangements: Alone Can pt return to current living arrangement?: No Admission Status: Voluntary Is patient capable of signing voluntary admission?: No Referral Source: Self/Family/Friend Insurance type:  [facility])  Medical Screening Exam ([place]) Medical Exam completed:  (n/a)  Crisis Care Plan Living Arrangements: Alone Legal Guardian:  (No ) Name of Psychiatrist:  Consulting civil engineer ) Name of Therapist:  Consulting civil engineer )  Education Status Is patient currently in school?: No Current Grade:  (n/a) Highest grade of school patient has completed:  (n/a) Name of school:  (n/a) Contact person:  (n/a)  Risk to self with the past 6 months Suicidal Ideation: Yes-Currently Present Has patient been a risk to self within the past 6 months prior to admission? : Yes Suicidal Intent: Yes-Currently Present Has patient had any suicidal intent within the past 6 months prior to admission? : Yes Is patient at risk for suicide?: Yes Suicidal Plan?: Yes-Currently Present Has patient had any suicidal plan within the past 6 months prior to admission? : Yes Specify Current Suicidal Plan:  (overdose of cut self ) Access to Means: Yes Specify Access to Suicidal Means:  (sharp objects and medications) What has been your use of drugs/alcohol within the last 12 months?:  (patient reports alcohol and thc uasge) Previous Attempts/Gestures: Yes How many times?:  (several (OD and and cutting)) Other Self Harm Risks:  (burn self with cigarrettes) Triggers for Past Attempts:  (patient reports depression ) Intentional Self Injurious Behavior: None Family Suicide History: No Recent stressful life event(s): Other (Comment) (feels he isn't supported) Persecutory voices/beliefs?: No Depression: Yes Depression Symptoms: Feeling angry/irritable, Feeling worthless/self pity, Guilt, Loss of interest in usual  pleasures, Fatigue, Isolating, Tearfulness, Insomnia, Despondent Substance abuse history and/or treatment for substance abuse?: No Suicide prevention information given to non-admitted patients: Not applicable  Risk to Others within the past 6 months Homicidal Ideation: No Does patient have any lifetime risk of violence toward others beyond the six months prior to admission? : No Thoughts of Harm to Others: No Current Homicidal Intent: No Current Homicidal Plan: No Access to Homicidal Means: No Identified Victim:  (n/a) History of harm to others?: No Assessment of Violence: None Noted Violent Behavior Description:  (patient is calm and cooperative) Does patient have access to weapons?: No Criminal Charges Pending?: No Does patient have a court date: No Is patient on probation?: No  Psychosis Hallucinations: Auditory, Visual (Auditory-"Voices tell me to kill myself..I'm worthless") Delusions:  (Visual-"[place]")  Mental Status Report Appearance/Hygiene: Disheveled Eye Contact: Good Motor Activity: Freedom of movement Speech: Logical/coherent Level of Consciousness: Alert Mood: Depressed Affect: Appropriate to circumstance Anxiety Level: None Thought Processes: Coherent, Relevant Judgement: Impaired Orientation: Person, Place, Time, Situation Obsessive Compulsive Thoughts/Behaviors: None  Cognitive Functioning Concentration: Decreased Memory: Recent Intact, Remote Intact IQ: Average Insight: Poor Impulse Control: Poor Appetite: Fair Weight Loss:  (none reported) Weight Gain:  (none reported) Sleep: Decreased Total Hours of Sleep:  (varies - 2 to 6 hrs )  Vegetative Symptoms: None  ADLScreening [facility] Assessment Services) Patient's cognitive ability adequate to safely complete daily activities?: Yes Patient able to express need for assistance with ADLs?: Yes Independently performs ADLs?: Yes (appropriate for developmental age)  Prior Inpatient Therapy Prior Inpatient  Therapy: Yes Prior Therapy Dates:  (yes-multiple prior admissions; pt unable to recall dates) Prior Therapy Facilty/Provider(s):  [facility], [facility], and several other facilities out of state) Reason for Treatment:  (n/a)  Prior Outpatient Therapy Prior Outpatient Therapy: No Prior Therapy Dates:  (n/a) Prior Therapy Facilty/Provider(s):  (n/a) Reason for Treatment:  (n/a) Does patient have an ACCT team?: No Does patient have Intensive In-House Services?  : No Does patient have [facility] services? : No Does patient have [facility] services?: No  ADL Screening (condition at time of admission) Patient's cognitive ability adequate to safely complete daily activities?: Yes Is the patient deaf or have difficulty hearing?: No Does the patient have difficulty seeing, even when wearing glasses/contacts?: No Does the patient have difficulty concentrating, remembering, or making decisions?: No Patient able to express need for assistance with ADLs?: Yes Does the patient have difficulty dressing or bathing?: No Independently performs ADLs?: Yes (appropriate for developmental age) Does the patient have difficulty walking or climbing stairs?: No Weakness of Legs: None Weakness of Arms/Hands: None  Home Assistive Devices/Equipment Home Assistive Devices/Equipment: None    Abuse/Neglect Assessment (Assessment to be complete while patient is alone) Physical Abuse: Denies Verbal Abuse: Denies Sexual Abuse: Denies Exploitation of patient/patient's resources: Denies Self-Neglect: Denies Values / Beliefs Cultural Requests During Hospitalization: None Spiritual Requests During Hospitalization: None   Advance Directives (For Healthcare) Does patient have an advance directive?: No Would patient like information on creating an advanced directive?: No - patient declined information    Additional Information 1:1 In Past 12 Months?: No CIRT Risk: No Elopement Risk: No Does patient have medical clearance?:  Yes     Disposition:  Disposition Initial Assessment Completed for this Encounter: Yes Disposition of Patient: Inpatient treatment program (Meets criteria for inpatient treatment, per Dr. Lovena Le)  On Site Evaluation by:   Reviewed with Physician:    Waldon [facility] 10/10/2015 3:43 PM

## 2015-10-10 NOTE — ED Notes (Signed)
Patient resting quietly with eyes closed. Respirations even and unlabored. No distress noted noted.

## 2015-10-10 NOTE — Progress Notes (Signed)
Meets criteria for inpatient treatment, per Dr. Lovena Le  Patient has been referred to the following hospitals: [facility] - per Manuela Schwartz, send referral. [facility] - per intake, accepting referrals. [facility] - left a voicemail. [facility] - per [place], accepting referrals. [facility] - left voicemail inquiring about bed status. [facility], [facility], and [facility] - all accepting referrals for the waitlist.  At capacity: [place], [place] Disposition staff 10/10/2015 6:29 PM

## 2015-10-10 NOTE — ED Notes (Signed)
Pt states that he is hear because of alcohol and drug use and because he is seeing and hearing vampires at his home. He explained that he has schizoaffective disorder and he likes to have the TV on to distract from the voices. His affect is flat and he appears to be depressed and is shaky.

## 2015-10-10 NOTE — ED Provider Notes (Signed)
CSN: [id number]     Arrival date & time 10/10/15  1132 History   First MD Initiated Contact with Patient 10/10/15 1228     Chief Complaint  Patient presents with  . Suicidal      HPI Per EMS, pt from home. Pt c/o suicidal thoughts and alcohol abuse. Psych hx. Pt seeing vampires in house and that they were going to kill him. Vitals: 144/92, hr 119, 95%, resp 22. He called for assist. Voluntary. Alert and Oriented. Last drink was last night.  Past Medical History  Diagnosis Date  . Psychosis   . PTSD (post-traumatic stress disorder)   . Seizure disorder ([place])     related to etoh seizure  . Alcohol abuse   . Schizoaffective disorder   . Anxiety     at age 49  . Depression     at age 37   Past Surgical History  Procedure Laterality Date  . Foot surgery      right  . Hand surgery     Family History  Problem Relation Age of Onset  . Alcoholism Mother   . CAD Father   . Depression Brother    Social History  Substance Use Topics  . Smoking status: Current Every Day Smoker -- 1.00 packs/day for 24 years    Types: Cigarettes  . Smokeless tobacco: Never Used  . Alcohol Use: 0.0 oz/week    0 Standard drinks or equivalent per week     Comment: "I'm a binge drinker"  Last drink on November 2016    Review of Systems  Unable to perform ROS: Psychiatric disorder      Allergies  Wellbutrin  Home Medications   Prior to Admission medications   Medication Sig Start Date End Date Taking? Authorizing Provider  gabapentin (NEURONTIN) 400 MG capsule Take 1 capsule (400 mg total) by mouth 3 (three) times daily as needed. Patient taking differently: Take 400 mg by mouth 4 (four) times daily.  06/30/15  Yes Kerrie Buffalo, NP  QUEtiapine (SEROQUEL) 400 MG tablet Take 400 mg by mouth at bedtime.   Yes Historical Provider, MD  sertraline (ZOLOFT) 25 MG tablet Take 3 tablets (75 mg total) by mouth daily. 06/30/15  Yes Kerrie Buffalo, NP  allopurinol (ZYLOPRIM) 100 MG tablet  Take 1 tablet (100 mg total) by mouth daily. 10/22/15   Boykin Nearing, MD  oseltamivir (TAMIFLU) 75 MG capsule Take 1 capsule (75 mg total) by mouth 2 (two) times daily. 10/22/15   Josalyn Funches, MD  pantoprazole (PROTONIX) 40 MG tablet Take 1 tablet (40 mg total) by mouth 2 (two) times daily. 10/22/15   Josalyn Funches, MD   BP 142/91 mmHg  Pulse 94  Temp(Src) 97.7 F (36.5 C) (Oral)  Resp 16  SpO2 99% Physical Exam  Constitutional: He is oriented to person, place, and time. He appears well-developed and well-nourished. No distress.  HENT:  Head: Normocephalic and atraumatic.  Eyes: Pupils are equal, round, and reactive to light.  Neck: Normal range of motion.  Cardiovascular: Normal rate and intact distal pulses.   Pulmonary/Chest: No respiratory distress.  Abdominal: Normal appearance. He exhibits no distension.  Musculoskeletal: Normal range of motion.  Neurological: He is alert and oriented to person, place, and time. No cranial nerve deficit.  Skin: Skin is warm and dry. No rash noted.  Psychiatric: His mood appears anxious. He is agitated. He expresses suicidal ideation.  Nursing note and vitals reviewed.   ED Course  Procedures (including critical care  time) Medications  LORazepam (ATIVAN) tablet 2 mg (2 mg Oral Given 10/10/15 1333)  promethazine (PHENERGAN) tablet 25 mg (25 mg Oral Given 10/10/15 1333)    Labs Review Labs Reviewed  COMPREHENSIVE METABOLIC PANEL - Abnormal; Notable for the following:    Chloride 99 (*)    CO2 21 (*)    Glucose, Bld 103 (*)    Total Protein 8.4 (*)    Anion gap 16 (*)    All other components within normal limits  ETHANOL - Abnormal; Notable for the following:    Alcohol, Ethyl (B) 56 (*)    All other components within normal limits  ACETAMINOPHEN LEVEL - Abnormal; Notable for the following:    Acetaminophen (Tylenol), Serum <10 (*)    All other components within normal limits  URINE RAPID DRUG SCREEN, HOSP PERFORMED - Abnormal;  Notable for the following:    Opiates POSITIVE (*)    Tetrahydrocannabinol POSITIVE (*)    All other components within normal limits  SALICYLATE LEVEL  CBC    Imaging Review No results found. I have personally reviewed and evaluated these images and lab results as part of my medical decision-making.    MDM   Final diagnoses:  Alcohol abuse  Psychosis, unspecified psychosis type        Leonard Schwartz, MD 10/25/15 1556

## 2015-10-11 DIAGNOSIS — R45851 Suicidal ideations: Secondary | ICD-10-CM

## 2015-10-11 DIAGNOSIS — F10239 Alcohol dependence with withdrawal, unspecified: Secondary | ICD-10-CM

## 2015-10-11 NOTE — ED Notes (Signed)
Christy Sartorius RN updated w/ additional medications given prior to transport

## 2015-10-11 NOTE — Consult Note (Signed)
[facility] Face-to-Face Psychiatry Consult   Reason for Consult:  Intoxication with suicidal threats Referring Physician:  ED  MD  Patient Identification: [name] MRN:  [id number] Principal Diagnosis: <principal problem not specified> Diagnosis:   Patient Active Problem List   Diagnosis Date Noted  . Aspiration pneumonia ([place]) [J69.0] 10/03/2015  . COPD exacerbation ([place]) [J44.1] 10/03/2015  . AKI (acute kidney injury) ([place]) [N17.9] 10/03/2015  . Alcohol withdrawal seizure with complication ([place]) [P71.062, R56.9] 09/30/2015  . Vomiting and diarrhea [R11.10, R19.7] 09/30/2015  . Abdominal pain [R10.9] 09/30/2015  . Gout flare [M10.9] 09/13/2015  . Schizoaffective disorder, depressive type ([place]) [F25.1]   . History of alcohol abuse [F10.10]   . Current smoker [Z72.0] 11/20/2014  . Post traumatic stress disorder (PTSD) [F43.10] 11/03/2011    Total Time spent with patient: 30 minutes  Subjective:   [name] is a 45 y.o. male patient admitted with suicidal thoughts .  HPI:  Mr Burkitt says he is an alcoholic who drinks in binges lasting weeks at a time.  Drinking up to 24 beers daily recently with a history of seizures when drinking.  Depressed even when not drinking and is suicidal today with a plan to overdose or kill himself.  No particular precipitant he says.  Lives alone.  History of multiple attempts in the past with multiple admissions.  Past Psychiatric History: multiple admissions for alcohol and depression  Risk to Self: Suicidal Ideation: Yes-Currently Present Suicidal Intent: Yes-Currently Present Is patient at risk for suicide?: Yes Suicidal Plan?: Yes-Currently Present Specify Current Suicidal Plan:  (overdose of cut self ) Access to Means: Yes Specify Access to Suicidal Means:  (sharp objects and medications) What has been your use of drugs/alcohol within the last 12 months?:  (patient reports alcohol and thc uasge) How many times?:  (several (OD and and  cutting)) Other Self Harm Risks:  (burn self with cigarrettes) Triggers for Past Attempts:  (patient reports depression ) Intentional Self Injurious Behavior: None Risk to Others: Homicidal Ideation: No Thoughts of Harm to Others: No Current Homicidal Intent: No Current Homicidal Plan: No Access to Homicidal Means: No Identified Victim:  (n/a) History of harm to others?: No Assessment of Violence: None Noted Violent Behavior Description:  (patient is calm and cooperative) Does patient have access to weapons?: No Criminal Charges Pending?: No Does patient have a court date: No Prior Inpatient Therapy: Prior Inpatient Therapy: Yes Prior Therapy Dates:  (yes-multiple prior admissions; pt unable to recall dates) Prior Therapy Facilty/Provider(s):  [facility], [place], and several other facilities out of state) Reason for Treatment:  (n/a) Prior Outpatient Therapy: Prior Outpatient Therapy: No Prior Therapy Dates:  (n/a) Prior Therapy Facilty/Provider(s):  (n/a) Reason for Treatment:  (n/a) Does patient have an ACCT team?: No Does patient have Intensive In-House Services?  : No Does patient have [facility] services? : No Does patient have P4CC services?: No  Past Medical History:  Past Medical History  Diagnosis Date  . Psychosis   . PTSD (post-traumatic stress disorder)   . Seizure disorder ([place])     related to etoh seizure  . Alcohol abuse   . Schizoaffective disorder   . Anxiety     at age 50  . Depression     at age 37    Past Surgical History  Procedure Laterality Date  . Foot surgery      right  . Hand surgery     Family History:  Family History  Problem Relation Age of Onset  .  Alcoholism Mother   . CAD Father   . Depression Brother    Family Psychiatric  History: mother with alcoholism brother with depression Social History:  History  Alcohol Use  . 0.0 oz/week  . 0 Standard drinks or equivalent per week    Comment: "I'm a binge drinker"  Last drink on November  2016     History  Drug Use  . 1.00 per week  . Special: Marijuana    Comment: THC 2 to 3 times per month. Last used Dec 2016    Social History   Social History  . Marital Status: Divorced    Spouse Name: N/A  . Number of Children: N/A  . Years of Education: N/A   Social History Main Topics  . Smoking status: Current Every Day Smoker -- 1.00 packs/day for 24 years    Types: Cigarettes  . Smokeless tobacco: Never Used  . Alcohol Use: 0.0 oz/week    0 Standard drinks or equivalent per week     Comment: "I'm a binge drinker"  Last drink on November 2016  . Drug Use: 1.00 per week    Special: Marijuana     Comment: THC 2 to 3 times per month. Last used Dec 2016  . Sexual Activity: Yes    Birth Control/ Protection: Condom   Other Topics Concern  . None   Social History Narrative   Additional Social History:    Allergies:   Allergies  Allergen Reactions  . Wellbutrin [Bupropion] Other (See Comments)    Makes me feel like I'm have a seizure    Labs:  Results for orders placed or performed during the hospital encounter of 10/10/15 (from the past 48 hour(s))  Urine rapid drug screen (hosp performed) (Not at [facility])     Status: Abnormal   Collection Time: 10/10/15 12:20 PM  Result Value Ref Range   Opiates POSITIVE (A) NONE DETECTED   Cocaine NONE DETECTED NONE DETECTED   Benzodiazepines NONE DETECTED NONE DETECTED   Amphetamines NONE DETECTED NONE DETECTED   Tetrahydrocannabinol POSITIVE (A) NONE DETECTED   Barbiturates NONE DETECTED NONE DETECTED    Comment:        DRUG SCREEN FOR MEDICAL PURPOSES ONLY.  IF CONFIRMATION IS NEEDED FOR ANY PURPOSE, NOTIFY LAB WITHIN 5 DAYS.        LOWEST DETECTABLE LIMITS FOR URINE DRUG SCREEN Drug Class       Cutoff (ng/mL) Amphetamine      1000 Barbiturate      200 Benzodiazepine   939 Tricyclics       030 Opiates          300 Cocaine          300 THC              50   Comprehensive metabolic panel     Status: Abnormal    Collection Time: 10/10/15 12:33 PM  Result Value Ref Range   Sodium 136 135 - 145 mmol/L   Potassium 4.2 3.5 - 5.1 mmol/L   Chloride 99 (L) 101 - 111 mmol/L   CO2 21 (L) 22 - 32 mmol/L   Glucose, Bld 103 (H) 65 - 99 mg/dL   BUN 12 6 - 20 mg/dL   Creatinine, Ser 0.97 0.61 - 1.24 mg/dL   Calcium 9.7 8.9 - 10.3 mg/dL   Total Protein 8.4 (H) 6.5 - 8.1 g/dL   Albumin 4.9 3.5 - 5.0 g/dL   AST 21 15 - 41 U/L  ALT 24 17 - 63 U/L   Alkaline Phosphatase 96 38 - 126 U/L   Total Bilirubin 0.3 0.3 - 1.2 mg/dL   GFR calc non Af Amer >60 >60 mL/min   GFR calc Af Amer >60 >60 mL/min    Comment: (NOTE) The eGFR has been calculated using the CKD EPI equation. This calculation has not been validated in all clinical situations. eGFR's persistently <60 mL/min signify possible Chronic Kidney Disease.    Anion gap 16 (H) 5 - 15  CBC     Status: None   Collection Time: 10/10/15 12:33 PM  Result Value Ref Range   WBC 7.8 4.0 - 10.5 K/uL   RBC 5.08 4.22 - 5.81 MIL/uL   Hemoglobin 16.4 13.0 - 17.0 g/dL   HCT 47.3 39.0 - 52.0 %   MCV 93.1 78.0 - 100.0 fL   MCH 32.3 26.0 - 34.0 pg   MCHC 34.7 30.0 - 36.0 g/dL   RDW 14.1 11.5 - 15.5 %   Platelets 377 150 - 400 K/uL  Ethanol (ETOH)     Status: Abnormal   Collection Time: 10/10/15 12:37 PM  Result Value Ref Range   Alcohol, Ethyl (B) 56 (H) <5 mg/dL    Comment:        LOWEST DETECTABLE LIMIT FOR SERUM ALCOHOL IS 5 mg/dL FOR MEDICAL PURPOSES ONLY   Salicylate level     Status: None   Collection Time: 10/10/15 12:37 PM  Result Value Ref Range   Salicylate Lvl <3.3 2.8 - 30.0 mg/dL  Acetaminophen level     Status: Abnormal   Collection Time: 10/10/15 12:37 PM  Result Value Ref Range   Acetaminophen (Tylenol), Serum <10 (L) 10 - 30 ug/mL    Comment:        THERAPEUTIC CONCENTRATIONS VARY SIGNIFICANTLY. A RANGE OF 10-30 ug/mL MAY BE AN EFFECTIVE CONCENTRATION FOR MANY PATIENTS. HOWEVER, SOME ARE BEST TREATED AT CONCENTRATIONS OUTSIDE  THIS RANGE. ACETAMINOPHEN CONCENTRATIONS >150 ug/mL AT 4 HOURS AFTER INGESTION AND >50 ug/mL AT 12 HOURS AFTER INGESTION ARE OFTEN ASSOCIATED WITH TOXIC REACTIONS.     Current Facility-Administered Medications  Medication Dose Route Frequency Provider Last Rate Last Dose  . acetaminophen (TYLENOL) tablet 650 mg  650 mg Oral Q4H PRN Leonard Schwartz, MD   650 mg at 10/11/15 1028  . alum & mag hydroxide-simeth (MAALOX/MYLANTA) 200-200-20 MG/5ML suspension 30 mL  30 mL Oral PRN Leonard Schwartz, MD      . LORazepam (ATIVAN) tablet 0-4 mg  0-4 mg Oral 4 times per day Leonard Schwartz, MD   1 mg at 10/11/15 0076   Followed by  . [START ON 10/12/2015] LORazepam (ATIVAN) tablet 0-4 mg  0-4 mg Oral Q12H Leonard Schwartz, MD      . nicotine (NICODERM CQ - dosed in mg/24 hours) patch 21 mg  21 mg Transdermal Daily Leonard Schwartz, MD   21 mg at 10/11/15 2263  . ondansetron (ZOFRAN) tablet 4 mg  4 mg Oral Q8H PRN Leonard Schwartz, MD      . thiamine (VITAMIN B-1) tablet 100 mg  100 mg Oral Daily Leonard Schwartz, MD   100 mg at 10/11/15 3354   Or  . thiamine (B-1) injection 100 mg  100 mg Intravenous Daily Leonard Schwartz, MD      . zolpidem Lorrin Mais) tablet 5 mg  5 mg Oral QHS PRN Leonard Schwartz, MD   5 mg at 10/10/15 2109   Current Outpatient Prescriptions  Medication Sig Dispense Refill  .  allopurinol (ZYLOPRIM) 100 MG tablet Take 1 tablet (100 mg total) by mouth daily. 30 tablet 0  . gabapentin (NEURONTIN) 400 MG capsule Take 1 capsule (400 mg total) by mouth 3 (three) times daily as needed. (Patient taking differently: Take 400 mg by mouth 4 (four) times daily. ) 30 capsule 0  . pantoprazole (PROTONIX) 40 MG tablet Take 1 tablet (40 mg total) by mouth 2 (two) times daily. 30 tablet 0  . QUEtiapine (SEROQUEL) 400 MG tablet Take 400 mg by mouth at bedtime.    . sertraline (ZOLOFT) 25 MG tablet Take 3 tablets (75 mg total) by mouth daily. 90 tablet 0  . levofloxacin (LEVAQUIN) 750 MG tablet Take 1 tablet (750 mg  total) by mouth daily. (Patient not taking: Reported on 10/10/2015) 5 tablet 0    Musculoskeletal: Strength & Muscle Tone: within normal limits Gait & Station: normal Patient leans: N/A  Psychiatric Specialty Exam: Review of Systems  Constitutional: Positive for malaise/fatigue.  Eyes: Negative.   Respiratory: Negative.   Cardiovascular: Negative.   Gastrointestinal: Positive for nausea.  Genitourinary: Negative.   Skin: Negative.   Neurological: Positive for tremors, seizures and headaches.  Endo/Heme/Allergies: Negative.   Psychiatric/Behavioral: Positive for depression, suicidal ideas, hallucinations and substance abuse.    Blood pressure 127/85, pulse 82, temperature 97.8 F (36.6 C), temperature source Oral, resp. rate 18, SpO2 99 %.There is no weight on file to calculate BMI.  General Appearance: Disheveled  Eye Contact::  Good  Speech:  Clear and Coherent  Volume:  Normal  Mood:  Depressed  Affect:  Depressed  Thought Process:  Coherent  Orientation:  Full (Time, Place, and Person)  Thought Content:  Hallucinations: Auditory Command:  telling him he is bad and to kill himself  Suicidal Thoughts:  Yes.  with intent/plan  Homicidal Thoughts:  No  Memory:  Immediate;   Good Recent;   Good Remote;   Good  Judgement:  Fair  Insight:  Fair  Psychomotor Activity:  Normal  Concentration:  Good  Recall:  Good  Fund of Knowledge:Good  Language: Good  Akathisia:  Negative  Handed:  Right  AIMS (if indicated):     Assets:  Communication Skills Housing  ADL's:  Intact  Cognition: WNL  Sleep:      Treatment Plan Summary: Daily contact with patient to assess and evaluate symptoms and progress in treatment and Plan accepted a[facility] for treatment of depression, schizoaffective disorder and alcohol dependence  Disposition: Recommend psychiatric Inpatient admission when medically cleared.  Donnelly Angelica, MD 10/11/2015 10:38 AM

## 2015-10-11 NOTE — BH Assessment (Addendum)
[place] Assessment Progress Note  This Probation officer spoke to pt about transfer to [facility].  He is agreeable, but reports that he was told by a counselor that he would have to make his own arrangements for transportation back home.  He is not able to do this.  I then discuss IVC with him, to which he agrees.  This was staffed with Donnelly Angelica, MD, who initiated IVC.  Petition has been faxed to the [facility], and at [employer] confirmed receipt.  As of this writing service of Findings and Custody Order is pending.  Pt's nurse, Narda Rutherford, has been notified.  Jalene Mullet, MA Triage Specialist [phone]    Addendum:  Findings and Custody Order has now been served.  IVC documents have been faxed to [facility].  Jalene Mullet, [place] Triage Specialist [phone]

## 2015-10-11 NOTE — ED Notes (Signed)
May give ativan now VORB Dr Lovena Le

## 2015-10-11 NOTE — ED Notes (Signed)
Pt has been accepted to [facility] per TTS, to be IVC'd

## 2015-10-11 NOTE — ED Notes (Signed)
Dr Lovena Le into see

## 2015-10-11 NOTE — ED Notes (Addendum)
Pt ambulatory w/o difficulty to [facility] with [employer].  IVC papers, EMTELA, MAR report, transfer report, assessment note,  face sheet, belongings , lab reports and bagged lunch sent with sheriff.

## 2015-10-11 NOTE — BH Assessment (Signed)
[place] Assessment Progress Note   Clinician received a call from [place] at [facility].  She reports that patient has been accepted to their facility by Dr. Mina Marble.  Patient can go to [address] [place].  Nurse call report number is [phone].

## 2015-10-11 NOTE — ED Notes (Signed)
TTS into see 

## 2015-10-13 DIAGNOSIS — M109 Gout, unspecified: Secondary | ICD-10-CM | POA: Diagnosis not present

## 2015-10-22 ENCOUNTER — Ambulatory Visit: Payer: Medicare Other | Attending: Family Medicine | Admitting: Family Medicine

## 2015-10-22 ENCOUNTER — Encounter: Payer: Self-pay | Admitting: Family Medicine

## 2015-10-22 ENCOUNTER — Ambulatory Visit (HOSPITAL_BASED_OUTPATIENT_CLINIC_OR_DEPARTMENT_OTHER): Payer: Medicare Other | Admitting: Clinical

## 2015-10-22 VITALS — BP 115/79 | HR 98 | Temp 98.0°F | Resp 16 | Ht 72.0 in | Wt 240.0 lb

## 2015-10-22 DIAGNOSIS — R0981 Nasal congestion: Secondary | ICD-10-CM | POA: Insufficient documentation

## 2015-10-22 DIAGNOSIS — K219 Gastro-esophageal reflux disease without esophagitis: Secondary | ICD-10-CM | POA: Diagnosis not present

## 2015-10-22 DIAGNOSIS — J069 Acute upper respiratory infection, unspecified: Secondary | ICD-10-CM | POA: Insufficient documentation

## 2015-10-22 DIAGNOSIS — R6889 Other general symptoms and signs: Secondary | ICD-10-CM

## 2015-10-22 DIAGNOSIS — F17210 Nicotine dependence, cigarettes, uncomplicated: Secondary | ICD-10-CM | POA: Insufficient documentation

## 2015-10-22 DIAGNOSIS — F259 Schizoaffective disorder, unspecified: Secondary | ICD-10-CM

## 2015-10-22 DIAGNOSIS — F251 Schizoaffective disorder, depressive type: Secondary | ICD-10-CM | POA: Insufficient documentation

## 2015-10-22 DIAGNOSIS — M109 Gout, unspecified: Secondary | ICD-10-CM | POA: Insufficient documentation

## 2015-10-22 DIAGNOSIS — J690 Pneumonitis due to inhalation of food and vomit: Secondary | ICD-10-CM | POA: Diagnosis not present

## 2015-10-22 DIAGNOSIS — M1A9XX0 Chronic gout, unspecified, without tophus (tophi): Secondary | ICD-10-CM | POA: Diagnosis not present

## 2015-10-22 DIAGNOSIS — Z79899 Other long term (current) drug therapy: Secondary | ICD-10-CM | POA: Diagnosis not present

## 2015-10-22 DIAGNOSIS — J029 Acute pharyngitis, unspecified: Secondary | ICD-10-CM | POA: Diagnosis not present

## 2015-10-22 MED ORDER — ALLOPURINOL 100 MG PO TABS: 100.0000 mg | ORAL_TABLET | Freq: Every day | ORAL | Status: DC

## 2015-10-22 MED ORDER — PANTOPRAZOLE SODIUM 40 MG PO TBEC: 40.0000 mg | DELAYED_RELEASE_TABLET | Freq: Two times a day (BID) | ORAL | Status: DC

## 2015-10-22 MED ORDER — OSELTAMIVIR PHOSPHATE 75 MG PO CAPS
75.0000 mg | ORAL_CAPSULE | Freq: Two times a day (BID) | ORAL | Status: DC
Start: 2015-10-22 — End: 2016-02-23

## 2015-10-22 NOTE — Progress Notes (Signed)
ASSESSMENT: Pt currently experiencing and being treated for Schizoaffective disorder. Pt needs to continue with psychiatric care and f/u appointments with PCP. Stage of Change: precontemplative  PLAN: 1. F/U with behavioral health consultant in as needed 2. Psychiatric Medications: Seroquel, Zoloft. 3. Behavioral recommendation(s):   -Continue going to [place] appointments  SUBJECTIVE: Pt. referred by Dr Adrian Blackwater for depression screening :  Pt. reports the following symptoms/concerns: Pt states that he is content with regular, scheduled psychiatric services at [facility], and that the recent increase in Seroquel is helping; pt does not wish to discuss further at this time, says he is being treated for his schizoaffective disorder. Duration of problem: Over two years Severity: severe  OBJECTIVE: Orientation & Cognition: Oriented x3. Thought processes normal and appropriate to situation. Mood: appropriate. Affect: appropriate Appearance: appropriate Risk of harm to self or others: low risk of harm to self today (states no SI today); no known risk of harm to others Substance use: polysubstance Assessments administered: PHQ9: 6/ GAD7: 4  Diagnosis: Schizoaffective disorder, unspecified  CPT Code: F25.9 -------------------------------------------- Other(s) present in the room: none  Time spent with patient in exam room: 10 minutes, 3:30-3:40pm   Depression screen [facility] 2/9 10/22/2015 09/13/2015 02/16/2015  Decreased Interest 1 1 1   Down, Depressed, Hopeless 1 1 1   PHQ - 2 Score 2 2 2   Altered sleeping 0 1 1  Tired, decreased energy 1 1 0  Change in appetite 1 1 0  Feeling bad or failure about yourself  1 1 1   Trouble concentrating 0 0 0  Moving slowly or fidgety/restless 0 0 0  Suicidal thoughts 1 0 0  PHQ-9 Score 6 6 4    *Pt says no plans to end life in last 2 weeks  GAD 7 : Generalized Anxiety Score 10/22/2015 09/13/2015  Nervous, Anxious, on Edge 1 2  Control/stop worrying 1 1  Worry  too much - different things 1 1  Trouble relaxing 0 1  Restless 0 1  Easily annoyed or irritable 0 1  Afraid - awful might happen 1 1  Total GAD 7 Score 4 8

## 2015-10-22 NOTE — Assessment & Plan Note (Signed)
tamiflu ordered Self care per AVS

## 2015-10-22 NOTE — Patient Instructions (Addendum)
[name] was seen today for nasal congestion.  Diagnoses and all orders for this visit:  Chronic gout without tophus, unspecified cause, unspecified site -     allopurinol (ZYLOPRIM) 100 MG tablet; Take 1 tablet (100 mg total) by mouth daily.  Gastroesophageal reflux disease, esophagitis presence not specified -     pantoprazole (PROTONIX) 40 MG tablet; Take 1 tablet (40 mg total) by mouth 2 (two) times daily.  Flu-like symptoms -     oseltamivir (TAMIFLU) 75 MG capsule; Take 1 capsule (75 mg total) by mouth 2 (two) times daily. -     DG Chest 2 View; Future  Aspiration pneumonia, unspecified aspiration pneumonia type, unspecified laterality, unspecified part of lung ([facility]) -     DG Chest 2 View; Future   Influenza, Adult Influenza ("the flu") is a viral infection of the respiratory tract. It occurs more often in winter months because people spend more time in close contact with one another. Influenza can make you feel very sick. Influenza easily spreads from person to person (contagious). CAUSES  Influenza is caused by a virus that infects the respiratory tract. You can catch the virus by breathing in droplets from an infected person's cough or sneeze. You can also catch the virus by touching something that was recently contaminated with the virus and then touching your mouth, nose, or eyes. RISKS AND COMPLICATIONS You may be at risk for a more severe case of influenza if you smoke cigarettes, have diabetes, have chronic heart disease (such as heart failure) or lung disease (such as asthma), or if you have a weakened immune system. Elderly people and pregnant women are also at risk for more serious infections. The most common problem of influenza is a lung infection (pneumonia). Sometimes, this problem can require emergency medical care and may be life threatening. SIGNS AND SYMPTOMS  Symptoms typically last 4 to 10 days and may include:  Fever.  Chills.  Headache, body aches, and  muscle aches.  Sore throat.  Chest discomfort and cough.  Poor appetite.  Weakness or feeling tired.  Dizziness.  Nausea or vomiting. DIAGNOSIS  Diagnosis of influenza is often made based on your history and a physical exam. A nose or throat swab test can be done to confirm the diagnosis. TREATMENT  In mild cases, influenza goes away on its own. Treatment is directed at relieving symptoms. For more severe cases, your health care provider may prescribe antiviral medicines to shorten the sickness. Antibiotic medicines are not effective because the infection is caused by a virus, not by bacteria. HOME CARE INSTRUCTIONS  Take medicines only as directed by your health care provider.  Use a cool mist humidifier to make breathing easier.  Get plenty of rest until your temperature returns to normal. This usually takes 3 to 4 days.  Drink enough fluid to keep your urine clear or pale yellow.  Cover yourmouth and nosewhen coughing or sneezing,and wash your handswellto prevent thevirusfrom spreading.  Stay homefromwork orschool untilthe fever is gonefor at least 68full day. PREVENTION  An annual influenza vaccination (flu shot) is the best way to avoid getting influenza. An annual flu shot is now routinely recommended for all adults in the [place] IF:  You experiencechest pain, yourcough worsens,or you producemore mucus.  Youhave nausea,vomiting, ordiarrhea.  Your fever returns or gets worse. SEEK IMMEDIATE MEDICAL CARE IF:  You havetrouble breathing, you become short of breath,or your skin ornails becomebluish.  You have severe painor stiffnessin the neck.  You develop a sudden headache, or pain in the face or ear.  You have nausea or vomiting that you cannot control. MAKE SURE YOU:   Understand these instructions.  Will watch your condition.  Will get help right away if you are not doing well or get worse.   This information is  not intended to replace advice given to you by your health care provider. Make sure you discuss any questions you have with your health care provider.   Document Released: 08/01/2000 Document Revised: 08/25/2014 Document Reviewed: 11/03/2011 Elsevier Interactive Patient Education 2016 [employer].  Tamiflu for 5 days  CXR needed  Refilled protonix and allopurinol   F/u in 4 weeks   Dr. Adrian Blackwater

## 2015-10-22 NOTE — Assessment & Plan Note (Signed)
F/u CXR ordered.

## 2015-10-22 NOTE — Progress Notes (Signed)
Cold Sx x 1day  Productive cough  Body aches HA Pain scale #5 Tobacco user 1ppday No suicidal thought in the past two weeks

## 2015-10-22 NOTE — Progress Notes (Signed)
Subjective:  Patient ID: [name], male    DOB: 22-Feb-1971  Age: 45 y.o. MRN: [id number]  CC: Nasal Congestion   HPI Mang Tailor presents for   1. URI symptoms: x 24 hrs. He endorses congestion, cough, sore throat. CP with cough. No fever. He admits to sick contacts.  He was admitted in-patient psych for 8 days last week. He was treated an admitted for aspiration pna following seizure in setting of ETOH withdrawal from 2/12-2/16. He completed levaquin.    2. Gout: has chronic gout. Had flare during psych admission in L wrist and L foot. No pain, redness or swelling now. Has ran out of allopurinol.   3. Schizoaffective: recent psych admission. Has been to [facility] since discharge. Compliant with all meds. seroquel dose was increased.   Social History  Substance Use Topics  . Smoking status: Current Every Day Smoker -- 1.00 packs/day for 24 years    Types: Cigarettes  . Smokeless tobacco: Never Used  . Alcohol Use: 0.0 oz/week    0 Standard drinks or equivalent per week     Comment: "I'm a binge drinker"  Last drink on November 2016    Outpatient Prescriptions Prior to Visit  Medication Sig Dispense Refill  . gabapentin (NEURONTIN) 400 MG capsule Take 1 capsule (400 mg total) by mouth 3 (three) times daily as needed. (Patient taking differently: Take 400 mg by mouth 4 (four) times daily. ) 30 capsule 0  . pantoprazole (PROTONIX) 40 MG tablet Take 1 tablet (40 mg total) by mouth 2 (two) times daily. 30 tablet 0  . QUEtiapine (SEROQUEL) 400 MG tablet Take 400 mg by mouth at bedtime.    . sertraline (ZOLOFT) 25 MG tablet Take 3 tablets (75 mg total) by mouth daily. 90 tablet 0  . allopurinol (ZYLOPRIM) 100 MG tablet Take 1 tablet (100 mg total) by mouth daily. (Patient not taking: Reported on 10/22/2015) 30 tablet 0  . levofloxacin (LEVAQUIN) 750 MG tablet Take 1 tablet (750 mg total) by mouth daily. (Patient not taking: Reported on 10/10/2015) 5 tablet 0   No  facility-administered medications prior to visit.    ROS Review of Systems  Constitutional: Negative for fever, chills, fatigue and unexpected weight change.  HENT: Positive for congestion and sore throat.   Eyes: Negative for visual disturbance.  Respiratory: Positive for cough. Negative for shortness of breath.   Cardiovascular: Positive for chest pain. Negative for palpitations and leg swelling.  Gastrointestinal: Negative for nausea, vomiting, abdominal pain, diarrhea, constipation and blood in stool.  Endocrine: Negative for polydipsia, polyphagia and polyuria.  Musculoskeletal: Negative for myalgias, back pain, arthralgias, gait problem and neck pain.  Skin: Negative for rash.  Allergic/Immunologic: Negative for immunocompromised state.  Hematological: Negative for adenopathy. Does not bruise/bleed easily.  Psychiatric/Behavioral: Positive for dysphoric mood. Negative for suicidal ideas and sleep disturbance. The patient is nervous/anxious.     Objective:  BP 115/79 mmHg  Pulse 98  Temp(Src) 98 F (36.7 C) (Oral)  Resp 16  Ht 6' (1.829 m)  Wt 240 lb (108.863 kg)  BMI 32.54 kg/m2  SpO2 96%  BP/Weight 10/22/2015 10/11/2015 [id number]  Systolic BP [id number]  Diastolic BP 79 91 92  Wt. (Lbs) 240 - -  BMI 32.54 - -   Physical Exam  Constitutional: He appears well-developed and well-nourished. No distress.  HENT:  Head: Normocephalic and atraumatic.  Right Ear: Tympanic membrane, external ear and ear canal normal.  Left Ear: Tympanic membrane, external ear  and ear canal normal.  Nose: Mucosal edema present.  Mouth/Throat: Oropharynx is clear and moist.  Neck: Normal range of motion. Neck supple.  Cardiovascular: Normal rate, regular rhythm, normal heart sounds and intact distal pulses.   Pulmonary/Chest: Effort normal and breath sounds normal.  Musculoskeletal: He exhibits no edema.  Lymphadenopathy:    He has no cervical adenopathy.  Neurological: He is alert.  Skin:  Skin is warm and dry. No rash noted. No erythema.  Psychiatric: He has a normal mood and affect.   Lab Results  Component Value Date   HGBA1C 5.30 09/13/2015     Depression screen [facility] 2/9 10/22/2015 09/13/2015 02/16/2015  Decreased Interest 1 1 1   Down, Depressed, Hopeless 1 1 1   PHQ - 2 Score 2 2 2   Altered sleeping 0 1 1  Tired, decreased energy 1 1 0  Change in appetite 1 1 0  Feeling bad or failure about yourself  1 1 1   Trouble concentrating 0 0 0  Moving slowly or fidgety/restless 0 0 0  Suicidal thoughts 1 0 0  PHQ-9 Score 6 6 4     GAD 7 : Generalized Anxiety Score 10/22/2015 09/13/2015  Nervous, Anxious, on Edge 1 2  Control/stop worrying 1 1  Worry too much - different things 1 1  Trouble relaxing 0 1  Restless 0 1  Easily annoyed or irritable 0 1  Afraid - awful might happen 1 1  Total GAD 7 Score 4 8     Assessment & Plan:   Jarratt was seen today for nasal congestion.  Diagnoses and all orders for this visit:  Chronic gout without tophus, unspecified cause, unspecified site -     allopurinol (ZYLOPRIM) 100 MG tablet; Take 1 tablet (100 mg total) by mouth daily.  Gastroesophageal reflux disease, esophagitis presence not specified -     pantoprazole (PROTONIX) 40 MG tablet; Take 1 tablet (40 mg total) by mouth 2 (two) times daily.  Flu-like symptoms -     oseltamivir (TAMIFLU) 75 MG capsule; Take 1 capsule (75 mg total) by mouth 2 (two) times daily. -     DG Chest 2 View; Future  Aspiration pneumonia, unspecified aspiration pneumonia type, unspecified laterality, unspecified part of lung ([facility]) -     DG Chest 2 View; Future   Follow-up: No Follow-up on file.   Boykin Nearing MD

## 2015-10-22 NOTE — Assessment & Plan Note (Signed)
A; recent flare P: Refilled allopurinol

## 2015-10-22 NOTE — Assessment & Plan Note (Signed)
Followed by mental health, [facility]

## 2015-11-15 DIAGNOSIS — F251 Schizoaffective disorder, depressive type: Secondary | ICD-10-CM | POA: Diagnosis not present

## 2015-11-15 DIAGNOSIS — F250 Schizoaffective disorder, bipolar type: Secondary | ICD-10-CM | POA: Diagnosis not present

## 2015-12-11 ENCOUNTER — Telehealth: Payer: Self-pay | Admitting: Family Medicine

## 2015-12-11 NOTE — Telephone Encounter (Signed)
Pt. Called requesting a med refill on the following medications:  pantoprazole (PROTONIX) 40 MG tablet  allopurinol (ZYLOPRIM) 100 MG tablet  Please f/u with pt.

## 2015-12-12 ENCOUNTER — Other Ambulatory Visit: Payer: Self-pay | Admitting: *Deleted

## 2015-12-12 DIAGNOSIS — K219 Gastro-esophageal reflux disease without esophagitis: Secondary | ICD-10-CM

## 2015-12-12 DIAGNOSIS — M1A9XX0 Chronic gout, unspecified, without tophus (tophi): Secondary | ICD-10-CM

## 2015-12-12 MED ORDER — ALLOPURINOL 100 MG PO TABS: 100.0000 mg | ORAL_TABLET | Freq: Every day | ORAL | Status: DC

## 2015-12-12 MED ORDER — PANTOPRAZOLE SODIUM 40 MG PO TBEC: 40.0000 mg | DELAYED_RELEASE_TABLET | Freq: Two times a day (BID) | ORAL | Status: DC

## 2015-12-12 NOTE — Telephone Encounter (Signed)
Rx refill send to [facility]

## 2015-12-24 IMAGING — CR DG CHEST 2V
2 series · 2 of 2 positions shown · non-contrast
Comparison: 12/24/2014

CLINICAL DATA: Abdominal pain and nausea, smoking history.

EXAM:
CHEST  2 VIEW

[w chest pa]
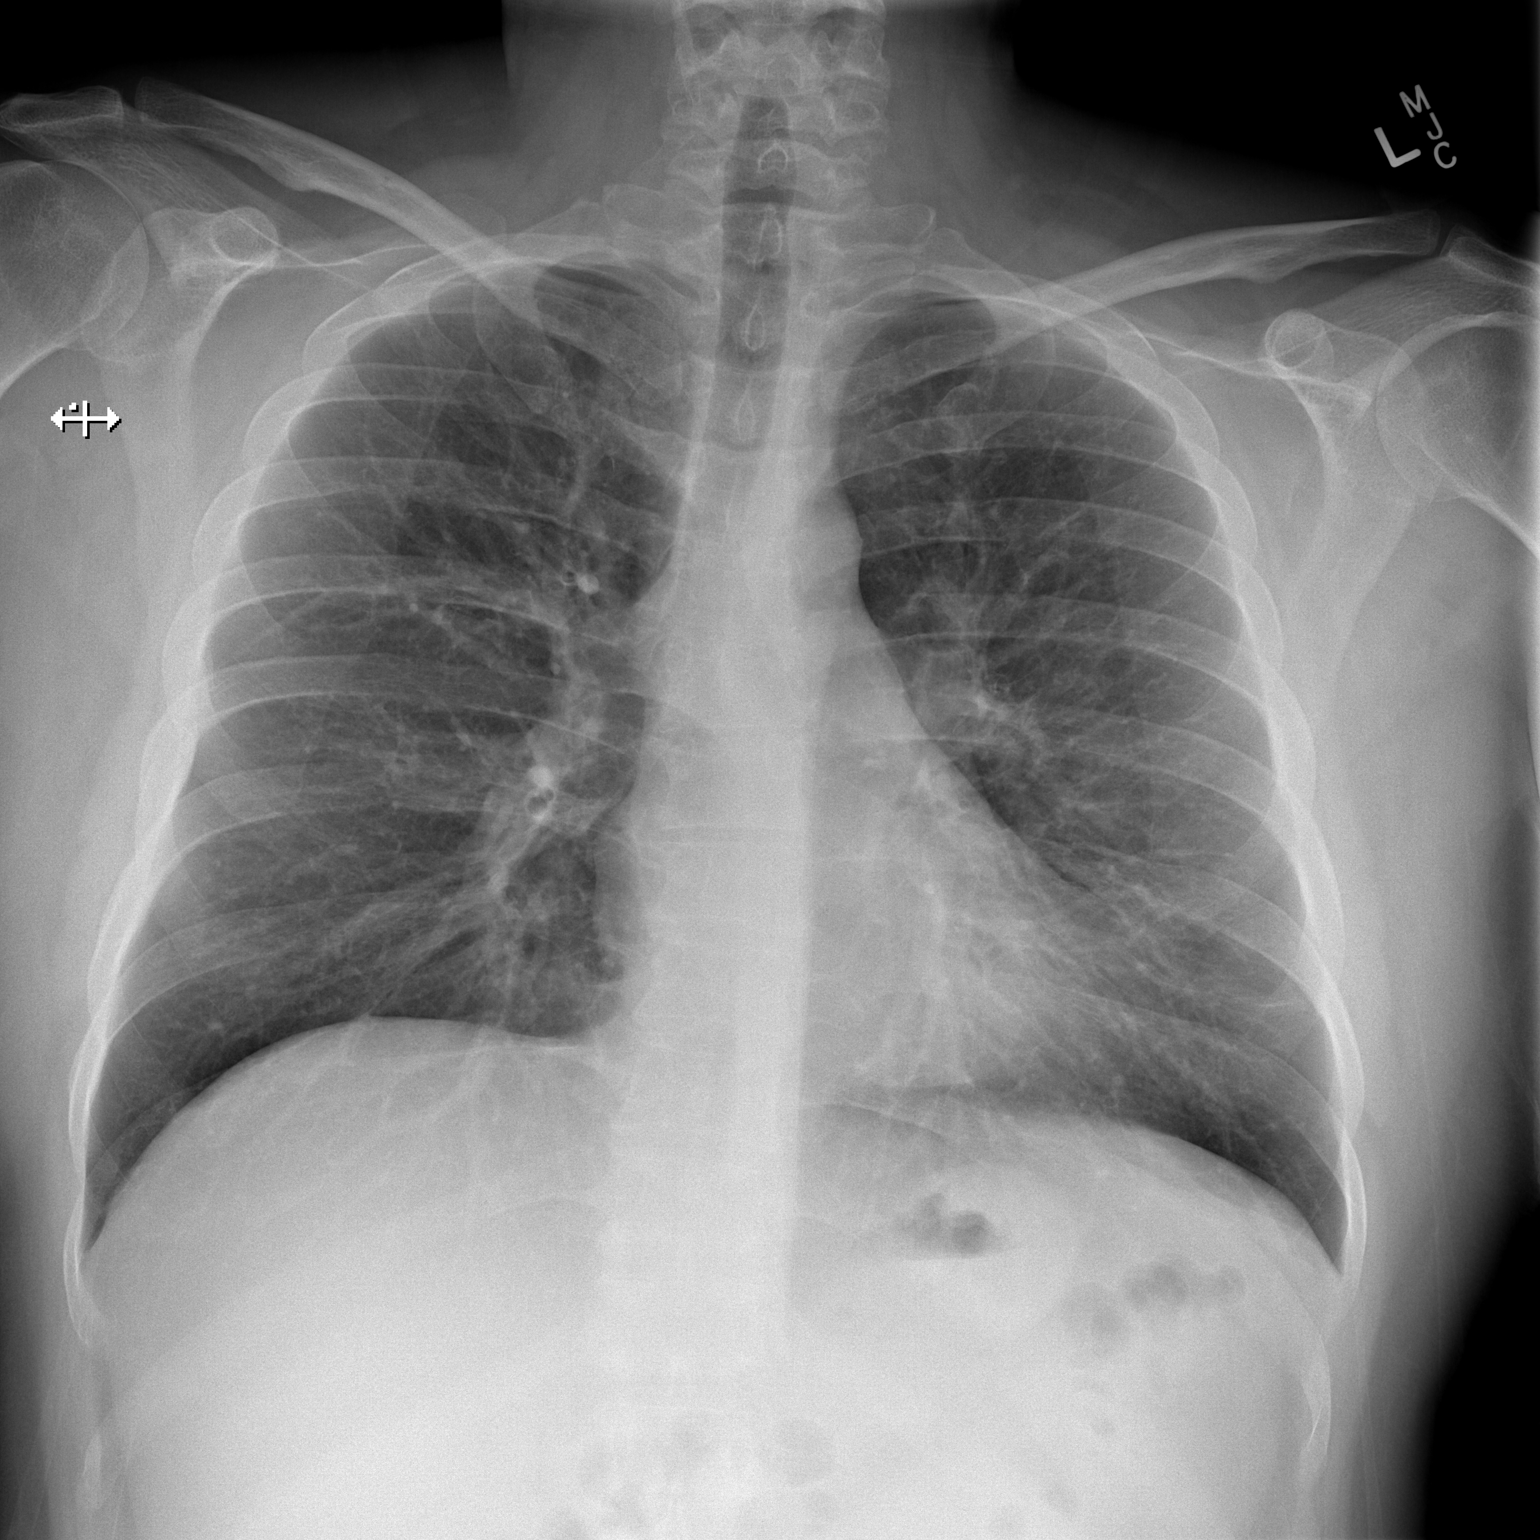

[w chest lat]
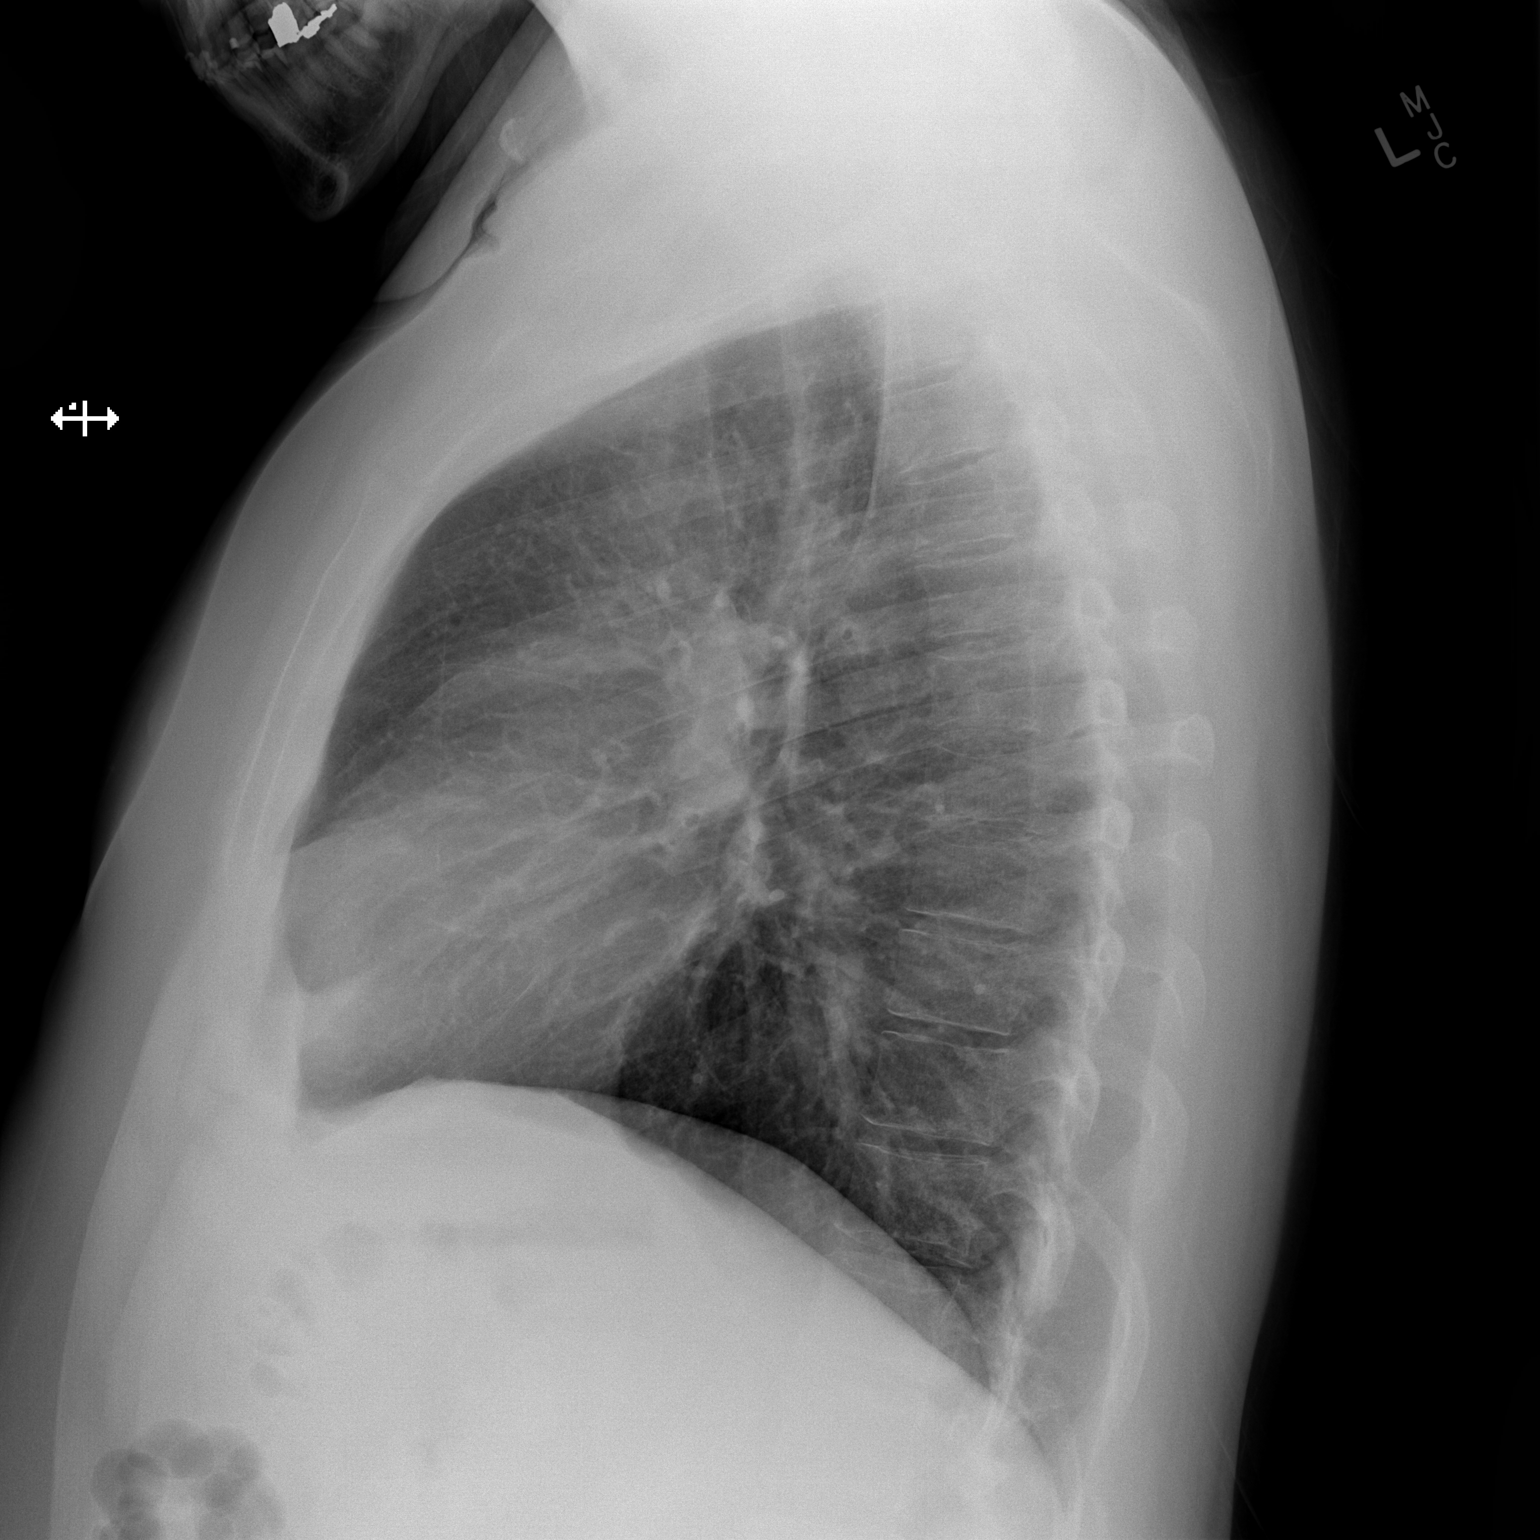

[2 of 2 positions shown; findings below may reference images not displayed]

FINDINGS: The cardiomediastinal contours are normal. Minimal subsegmental
atelectasis at the left lung base. Pulmonary vasculature is normal.
No consolidation, pleural effusion, or pneumothorax. No acute
osseous abnormalities are seen.
IMPRESSION: No acute pulmonary process.

## 2016-01-02 DIAGNOSIS — H1013 Acute atopic conjunctivitis, bilateral: Secondary | ICD-10-CM | POA: Diagnosis not present

## 2016-01-07 DIAGNOSIS — F251 Schizoaffective disorder, depressive type: Secondary | ICD-10-CM | POA: Diagnosis not present

## 2016-01-07 DIAGNOSIS — F250 Schizoaffective disorder, bipolar type: Secondary | ICD-10-CM | POA: Diagnosis not present

## 2016-02-22 ENCOUNTER — Encounter (HOSPITAL_COMMUNITY): Payer: Self-pay | Admitting: *Deleted

## 2016-02-22 ENCOUNTER — Emergency Department (HOSPITAL_COMMUNITY)
Admission: EM | Admit: 2016-02-22 | Discharge: 2016-02-22 | Payer: Medicare Other | Attending: Emergency Medicine | Admitting: Emergency Medicine

## 2016-02-22 ENCOUNTER — Observation Stay (HOSPITAL_COMMUNITY)
Admission: AD | Admit: 2016-02-22 | Discharge: 2016-02-23 | Disposition: A | Discharge status: Home / Self Care | Payer: Medicare Other | Source: Intra-hospital | Attending: Psychiatry | Admitting: Psychiatry

## 2016-02-22 DIAGNOSIS — F259 Schizoaffective disorder, unspecified: Secondary | ICD-10-CM | POA: Insufficient documentation

## 2016-02-22 DIAGNOSIS — F10230 Alcohol dependence with withdrawal, uncomplicated: Secondary | ICD-10-CM | POA: Diagnosis present

## 2016-02-22 DIAGNOSIS — F251 Schizoaffective disorder, depressive type: Secondary | ICD-10-CM | POA: Diagnosis present

## 2016-02-22 DIAGNOSIS — F1290 Cannabis use, unspecified, uncomplicated: Secondary | ICD-10-CM | POA: Insufficient documentation

## 2016-02-22 DIAGNOSIS — Y906 Blood alcohol level of 120-199 mg/100 ml: Secondary | ICD-10-CM | POA: Insufficient documentation

## 2016-02-22 DIAGNOSIS — F17210 Nicotine dependence, cigarettes, uncomplicated: Secondary | ICD-10-CM | POA: Diagnosis not present

## 2016-02-22 DIAGNOSIS — R45851 Suicidal ideations: Secondary | ICD-10-CM | POA: Diagnosis not present

## 2016-02-22 DIAGNOSIS — F4310 Post-traumatic stress disorder, unspecified: Secondary | ICD-10-CM | POA: Diagnosis not present

## 2016-02-22 DIAGNOSIS — F329 Major depressive disorder, single episode, unspecified: Secondary | ICD-10-CM | POA: Diagnosis not present

## 2016-02-22 DIAGNOSIS — Z888 Allergy status to other drugs, medicaments and biological substances status: Secondary | ICD-10-CM | POA: Insufficient documentation

## 2016-02-22 DIAGNOSIS — M109 Gout, unspecified: Secondary | ICD-10-CM | POA: Diagnosis not present

## 2016-02-22 DIAGNOSIS — Z79899 Other long term (current) drug therapy: Secondary | ICD-10-CM | POA: Diagnosis not present

## 2016-02-22 DIAGNOSIS — F1010 Alcohol abuse, uncomplicated: Secondary | ICD-10-CM

## 2016-02-22 DIAGNOSIS — Z818 Family history of other mental and behavioral disorders: Secondary | ICD-10-CM | POA: Insufficient documentation

## 2016-02-22 DIAGNOSIS — F1094 Alcohol use, unspecified with alcohol-induced mood disorder: Secondary | ICD-10-CM | POA: Diagnosis present

## 2016-02-22 DIAGNOSIS — K219 Gastro-esophageal reflux disease without esophagitis: Secondary | ICD-10-CM | POA: Insufficient documentation

## 2016-02-22 DIAGNOSIS — Z8669 Personal history of other diseases of the nervous system and sense organs: Secondary | ICD-10-CM | POA: Diagnosis not present

## 2016-02-22 DIAGNOSIS — F1024 Alcohol dependence with alcohol-induced mood disorder: Secondary | ICD-10-CM | POA: Diagnosis present

## 2016-02-22 LAB — CBC WITH DIFFERENTIAL/PLATELET
Basophils Absolute: 0.1 10*3/uL (ref 0.0–0.1)
Basophils Relative: 1 %
Eosinophils Absolute: 0 10*3/uL (ref 0.0–0.7)
Eosinophils Relative: 0 %
HCT: 47.9 % (ref 39.0–52.0)
Hemoglobin: 16.7 g/dL (ref 13.0–17.0)
Lymphocytes Relative: 23 %
Lymphs Abs: 1.4 10*3/uL (ref 0.7–4.0)
MCH: 31.7 pg (ref 26.0–34.0)
MCHC: 34.9 g/dL (ref 30.0–36.0)
MCV: 91.1 fL (ref 78.0–100.0)
Monocytes Absolute: 0.9 10*3/uL (ref 0.1–1.0)
Monocytes Relative: 14 %
Neutro Abs: 4 10*3/uL (ref 1.7–7.7)
Neutrophils Relative %: 62 %
Platelets: 306 10*3/uL (ref 150–400)
RBC: 5.26 MIL/uL (ref 4.22–5.81)
RDW: 13.8 % (ref 11.5–15.5)
WBC: 6.4 10*3/uL (ref 4.0–10.5)

## 2016-02-22 LAB — RAPID URINE DRUG SCREEN, HOSP PERFORMED
Amphetamines: NOT DETECTED
Barbiturates: NOT DETECTED
Benzodiazepines: NOT DETECTED
Cocaine: NOT DETECTED
Opiates: NOT DETECTED
Tetrahydrocannabinol: POSITIVE — AB

## 2016-02-22 LAB — ACETAMINOPHEN LEVEL: Acetaminophen (Tylenol), Serum: <10 ug/mL — ABNORMAL LOW (ref 10–30)

## 2016-02-22 LAB — COMPREHENSIVE METABOLIC PANEL
ALT: 56 U/L (ref 17–63)
AST: 59 U/L — ABNORMAL HIGH (ref 15–41)
Albumin: 5.3 g/dL — ABNORMAL HIGH (ref 3.5–5.0)
Alkaline Phosphatase: 90 U/L (ref 38–126)
Anion gap: 22 — ABNORMAL HIGH (ref 5–15)
BUN: 10 mg/dL (ref 6–20)
CO2: 24 mmol/L (ref 22–32)
Calcium: 9.1 mg/dL (ref 8.9–10.3)
Chloride: 90 mmol/L — ABNORMAL LOW (ref 101–111)
Creatinine, Ser: 0.9 mg/dL (ref 0.61–1.24)
GFR calc Af Amer: >60 mL/min (ref >60)
GFR calc non Af Amer: >60 mL/min (ref >60)
Glucose, Bld: 91 mg/dL (ref 65–99)
Potassium: 3.7 mmol/L (ref 3.5–5.1)
Sodium: 136 mmol/L (ref 135–145)
Total Bilirubin: 0.9 mg/dL (ref 0.3–1.2)
Total Protein: 8.5 g/dL — ABNORMAL HIGH (ref 6.5–8.1)

## 2016-02-22 LAB — LIPASE, BLOOD: Lipase: 35 U/L (ref 11–51)

## 2016-02-22 LAB — SALICYLATE LEVEL: Salicylate Lvl: <4 mg/dL (ref 2.8–30.0)

## 2016-02-22 LAB — ETHANOL: Alcohol, Ethyl (B): 168 mg/dL — ABNORMAL HIGH (ref <5)

## 2016-02-22 MED ORDER — ONDANSETRON HCL 4 MG/2ML IJ SOLN
4.0000 mg | Freq: Once | INTRAMUSCULAR | Status: AC
  Administered 2016-02-22: 4 mg via INTRAVENOUS
  Filled 2016-02-22: qty 2

## 2016-02-22 MED ORDER — SERTRALINE HCL 50 MG PO TABS
75.0000 mg | ORAL_TABLET | Freq: Every day | ORAL | Status: DC
  Administered 2016-02-23: 75 mg via ORAL
  Filled 2016-02-22: qty 1

## 2016-02-22 MED ORDER — LOPERAMIDE HCL 2 MG PO CAPS: 2.0000 mg | ORAL_CAPSULE | ORAL | Status: DC | PRN

## 2016-02-22 MED ORDER — HYDROXYZINE HCL 25 MG PO TABS: 25.0000 mg | ORAL_TABLET | Freq: Four times a day (QID) | ORAL | Status: DC | PRN

## 2016-02-22 MED ORDER — ONDANSETRON 4 MG PO TBDP: 4.0000 mg | ORAL_TABLET | Freq: Four times a day (QID) | ORAL | Status: DC | PRN

## 2016-02-22 MED ORDER — QUETIAPINE FUMARATE 200 MG PO TABS: 200.0000 mg | ORAL_TABLET | Freq: Every morning | ORAL | Status: DC

## 2016-02-22 MED ORDER — ADULT MULTIVITAMIN W/MINERALS CH
1.0000 | ORAL_TABLET | Freq: Every day | ORAL | Status: DC
  Administered 2016-02-22: 1 via ORAL
  Filled 2016-02-22: qty 1

## 2016-02-22 MED ORDER — SODIUM CHLORIDE 0.9 % IV BOLUS (SEPSIS)
1000.0000 mL | Freq: Once | INTRAVENOUS | Status: AC
Start: 2016-02-22 — End: 2016-02-22
  Administered 2016-02-22: 1000 mL via INTRAVENOUS

## 2016-02-22 MED ORDER — ACETAMINOPHEN 325 MG PO TABS
650.0000 mg | ORAL_TABLET | Freq: Once | ORAL | Status: AC
  Administered 2016-02-22: 650 mg via ORAL
  Filled 2016-02-22: qty 2

## 2016-02-22 MED ORDER — SERTRALINE HCL 50 MG PO TABS
75.0000 mg | ORAL_TABLET | Freq: Every day | ORAL | Status: DC
  Administered 2016-02-22: 75 mg via ORAL
  Filled 2016-02-22: qty 2

## 2016-02-22 MED ORDER — LORAZEPAM 1 MG PO TABS
1.0000 mg | ORAL_TABLET | Freq: Four times a day (QID) | ORAL | Status: DC
  Administered 2016-02-22 (×2): 1 mg via ORAL
  Filled 2016-02-22: qty 1

## 2016-02-22 MED ORDER — ACETAMINOPHEN 325 MG PO TABS
650.0000 mg | ORAL_TABLET | Freq: Four times a day (QID) | ORAL | Status: DC | PRN
  Administered 2016-02-23: 650 mg via ORAL
  Filled 2016-02-22: qty 2

## 2016-02-22 MED ORDER — LORAZEPAM 1 MG PO TABS: 1.0000 mg | ORAL_TABLET | Freq: Four times a day (QID) | ORAL | Status: DC | PRN

## 2016-02-22 MED ORDER — GABAPENTIN 400 MG PO CAPS
400.0000 mg | ORAL_CAPSULE | Freq: Four times a day (QID) | ORAL | Status: DC
  Administered 2016-02-22 – 2016-02-23 (×2): 400 mg via ORAL
  Filled 2016-02-22 (×2): qty 1

## 2016-02-22 MED ORDER — VITAMIN B-1 100 MG PO TABS
100.0000 mg | ORAL_TABLET | Freq: Every day | ORAL | Status: DC
  Administered 2016-02-22: 100 mg via ORAL
  Filled 2016-02-22: qty 1

## 2016-02-22 MED ORDER — QUETIAPINE FUMARATE 300 MG PO TABS
600.0000 mg | ORAL_TABLET | Freq: Every day | ORAL | Status: DC
  Administered 2016-02-22: 600 mg via ORAL
  Filled 2016-02-22: qty 2

## 2016-02-22 MED ORDER — ADULT MULTIVITAMIN W/MINERALS CH
1.0000 | ORAL_TABLET | Freq: Every day | ORAL | Status: DC
  Administered 2016-02-23: 1 via ORAL
  Filled 2016-02-22: qty 1

## 2016-02-22 MED ORDER — LORAZEPAM 1 MG PO TABS: 1.0000 mg | ORAL_TABLET | Freq: Every day | ORAL | Status: DC

## 2016-02-22 MED ORDER — QUETIAPINE FUMARATE 100 MG PO TABS
200.0000 mg | ORAL_TABLET | Freq: Every morning | ORAL | Status: DC
  Administered 2016-02-22: 200 mg via ORAL
  Filled 2016-02-22: qty 2

## 2016-02-22 MED ORDER — LORAZEPAM 1 MG PO TABS
1.0000 mg | ORAL_TABLET | Freq: Three times a day (TID) | ORAL | Status: DC
  Filled 2016-02-22: qty 1

## 2016-02-22 MED ORDER — PANTOPRAZOLE SODIUM 40 MG PO TBEC
40.0000 mg | DELAYED_RELEASE_TABLET | Freq: Two times a day (BID) | ORAL | Status: DC
  Administered 2016-02-23: 40 mg via ORAL
  Filled 2016-02-22: qty 1

## 2016-02-22 MED ORDER — LORAZEPAM 1 MG PO TABS: 1.0000 mg | ORAL_TABLET | Freq: Three times a day (TID) | ORAL | Status: DC

## 2016-02-22 MED ORDER — QUETIAPINE FUMARATE 300 MG PO TABS: 600.0000 mg | ORAL_TABLET | Freq: Every day | ORAL | Status: DC

## 2016-02-22 MED ORDER — ALUM & MAG HYDROXIDE-SIMETH 200-200-20 MG/5ML PO SUSP: 30.0000 mL | ORAL | Status: DC | PRN

## 2016-02-22 MED ORDER — MAGNESIUM HYDROXIDE 400 MG/5ML PO SUSP: 30.0000 mL | Freq: Every day | ORAL | Status: DC | PRN

## 2016-02-22 MED ORDER — LORAZEPAM 1 MG PO TABS: 1.0000 mg | ORAL_TABLET | Freq: Two times a day (BID) | ORAL | Status: DC

## 2016-02-22 MED ORDER — THIAMINE HCL 100 MG/ML IJ SOLN: 100.0000 mg | Freq: Once | INTRAMUSCULAR | Status: DC

## 2016-02-22 MED ORDER — LORAZEPAM 1 MG PO TABS
1.0000 mg | ORAL_TABLET | Freq: Four times a day (QID) | ORAL | Status: DC
  Administered 2016-02-22 – 2016-02-23 (×2): 1 mg via ORAL
  Filled 2016-02-22 (×2): qty 1

## 2016-02-22 MED ORDER — GABAPENTIN 400 MG PO CAPS
400.0000 mg | ORAL_CAPSULE | Freq: Four times a day (QID) | ORAL | Status: DC
  Administered 2016-02-22 (×2): 400 mg via ORAL
  Filled 2016-02-22 (×2): qty 1

## 2016-02-22 MED ORDER — ALLOPURINOL 100 MG PO TABS
100.0000 mg | ORAL_TABLET | Freq: Every day | ORAL | Status: DC
  Administered 2016-02-22: 100 mg via ORAL
  Filled 2016-02-22: qty 1

## 2016-02-22 MED ORDER — DICYCLOMINE HCL 10 MG/ML IM SOLN
20.0000 mg | Freq: Once | INTRAMUSCULAR | Status: AC
  Administered 2016-02-22: 20 mg via INTRAMUSCULAR
  Filled 2016-02-22: qty 2

## 2016-02-22 MED ORDER — IBUPROFEN 200 MG PO TABS
600.0000 mg | ORAL_TABLET | Freq: Once | ORAL | Status: AC
  Administered 2016-02-22: 600 mg via ORAL
  Filled 2016-02-22: qty 3

## 2016-02-22 MED ORDER — PANTOPRAZOLE SODIUM 40 MG PO TBEC
40.0000 mg | DELAYED_RELEASE_TABLET | Freq: Two times a day (BID) | ORAL | Status: DC
  Administered 2016-02-22: 40 mg via ORAL
  Filled 2016-02-22: qty 1

## 2016-02-22 MED ORDER — ALLOPURINOL 100 MG PO TABS
100.0000 mg | ORAL_TABLET | Freq: Every day | ORAL | Status: DC
  Administered 2016-02-23: 100 mg via ORAL
  Filled 2016-02-22 (×3): qty 1

## 2016-02-22 MED ORDER — VITAMIN B-1 100 MG PO TABS
100.0000 mg | ORAL_TABLET | Freq: Every day | ORAL | Status: DC
  Administered 2016-02-23: 100 mg via ORAL
  Filled 2016-02-22: qty 1

## 2016-02-22 MED ORDER — LORAZEPAM 1 MG PO TABS
1.0000 mg | ORAL_TABLET | Freq: Two times a day (BID) | ORAL | Status: DC
Start: 2016-02-24 — End: 2016-02-22

## 2016-02-22 NOTE — BH Assessment (Signed)
[place] Assessment Progress Note  Per Corena Pilgrim, MD, this pt would benefit from admission to the [facility] Observation Unit at this time.  Letitia Libra, RN, [facility] has assigned pt to Obs 5.  Pt has signed Voluntary Admission and Consent for Treatment, as well as Consent to Release Information to [facility], his outpatient provider, and a notification call has been placed.  Signed forms have been faxed to [facility].  Pt's nurse has been notified, and agrees to send original paperwork along with pt via [facility], and to call report to [phone] or [phone].  Jalene Mullet, [place] Triage Specialist [phone]

## 2016-02-22 NOTE — ED Notes (Signed)
Pt stated he is unable to give a urine sample. Pt is aware one is needed.

## 2016-02-22 NOTE — Progress Notes (Signed)
Patient arrived from [facility] stating that he drinks monthly on a binge. He stated that his fiancee had committed suicide in front of him some years before. He stated that being lonely was bothering him. He stated that he was doing okay for " a while"  He stated that he would like to get back on his meds and wanted to cope well. He stated that he would like to have a part-time job.    Patient remains safe through constant observation, education, support, and encouragement. Safety consent obtain through written document and verbal consent.   Patient receptive and compliant, will continue to monitor.

## 2016-02-22 NOTE — ED Provider Notes (Signed)
CSN: [id number]     Arrival date & time 02/22/16  0315 History   First MD Initiated Contact with Patient 02/22/16 0320     Chief Complaint  Patient presents with  . Abdominal Pain  . Suicidal     (Consider location/radiation/quality/duration/timing/severity/associated sxs/prior Treatment) HPI [name] is a 45 y.o. male with a history of schizoaffective disorder, seizures secondary to alcohol withdrawal, here for evaluation of abdominal discomfort, alcohol withdrawal and suicidal ideations. Patient reports he typically drinks one case of beer per day, unsure of how much he has had to drink today but reports last drink was earlier this evening?. States he has not taken his psychiatric medications over the past 3 days because he forgets to take them when he drinks. He reports history of suicide attempt in the past as well as suicidal ideations this evening. Plans to cut his throat with a knife. Denies any homicidal ideations. No current auditory or visual hallucinations, however he does report last week he saw "shadow people and vampires". Reports associated nausea, abdominal cramping and twitching. He also reports that his brother told him that he had a seizure earlier this evening. Patient does not recall this event. EMS reports no postictal state. Patient is alert and oriented now. No intraoral trauma or loss of bowel or bladder function.  Past Medical History  Diagnosis Date  . Psychosis   . PTSD (post-traumatic stress disorder)   . Seizure disorder ([place])     related to etoh seizure  . Alcohol abuse   . Schizoaffective disorder   . Anxiety     at age 27  . Depression     at age 28   Past Surgical History  Procedure Laterality Date  . Foot surgery      right  . Hand surgery     Family History  Problem Relation Age of Onset  . Alcoholism Mother   . CAD Father   . Depression Brother    Social History  Substance Use Topics  . Smoking status: Current Every Day Smoker --  1.00 packs/day for 24 years    Types: Cigarettes  . Smokeless tobacco: Never Used  . Alcohol Use: 0.0 oz/week    0 Standard drinks or equivalent per week     Comment: "I'm a binge drinker"  Last drink on November 2016    Review of Systems A 10 point review of systems was completed and was negative except for pertinent positives and negatives as mentioned in the history of present illness     Allergies  Wellbutrin  Home Medications   Prior to Admission medications   Medication Sig Start Date End Date Taking? Authorizing Provider  allopurinol (ZYLOPRIM) 100 MG tablet Take 1 tablet (100 mg total) by mouth daily. 12/12/15  Yes Josalyn Funches, MD  gabapentin (NEURONTIN) 400 MG capsule Take 1 capsule (400 mg total) by mouth 3 (three) times daily as needed. Patient taking differently: Take 400 mg by mouth 4 (four) times daily.  06/30/15  Yes Kerrie Buffalo, NP  pantoprazole (PROTONIX) 40 MG tablet Take 1 tablet (40 mg total) by mouth 2 (two) times daily. 12/12/15  Yes Josalyn Funches, MD  QUEtiapine (SEROQUEL) 200 MG tablet Take 200-600 mg by mouth 2 (two) times daily. 200 mg in the AM and 600 mg in the evening   Yes Historical Provider, MD  sertraline (ZOLOFT) 25 MG tablet Take 3 tablets (75 mg total) by mouth daily. 06/30/15  Yes Kerrie Buffalo, NP  oseltamivir (TAMIFLU)  75 MG capsule Take 1 capsule (75 mg total) by mouth 2 (two) times daily. Patient not taking: Reported on 02/22/2016 10/22/15   Josalyn Funches, MD   BP 138/89 mmHg  Pulse 85  Temp(Src) 98.5 F (36.9 C) (Oral)  Resp 17  SpO2 90% Physical Exam  Constitutional: He is oriented to person, place, and time. He appears well-developed and well-nourished.  HENT:  Head: Normocephalic and atraumatic.  Mouth/Throat: Oropharynx is clear and moist.  Eyes: Conjunctivae and EOM are normal. Pupils are equal, round, and reactive to light. Right eye exhibits no discharge. Left eye exhibits no discharge. No scleral icterus.  Neck: Neck  supple.  Cardiovascular: Normal rate, regular rhythm and normal heart sounds.   Pulmonary/Chest: Effort normal and breath sounds normal. No respiratory distress. He has no wheezes. He has no rales.  Abdominal: Soft. There is no tenderness.  Musculoskeletal: He exhibits no tenderness.  Neurological: He is alert and oriented to person, place, and time.  Cranial Nerves II-XII grossly intact. Motor strength and sensation intact and baseline. No appreciable tremor. No evidence of postictal state.  Skin: Skin is warm and dry. No rash noted.  Psychiatric: He has a normal mood and affect. His behavior is normal. Judgment and thought content normal.  Nursing note and vitals reviewed.   ED Course  Procedures (including critical care time) Labs Review Labs Reviewed  COMPREHENSIVE METABOLIC PANEL - Abnormal; Notable for the following:    Chloride 90 (*)    Total Protein 8.5 (*)    Albumin 5.3 (*)    AST 59 (*)    Anion gap 22 (*)    All other components within normal limits  ETHANOL - Abnormal; Notable for the following:    Alcohol, Ethyl (B) 168 (*)    All other components within normal limits  ACETAMINOPHEN LEVEL - Abnormal; Notable for the following:    Acetaminophen (Tylenol), Serum <10 (*)    All other components within normal limits  CBC WITH DIFFERENTIAL/PLATELET  LIPASE, BLOOD  SALICYLATE LEVEL  URINE RAPID DRUG SCREEN, HOSP PERFORMED    Imaging Review No results found. I have personally reviewed and evaluated these images and lab results as part of my medical decision-making.   EKG Interpretation None     Filed Vitals:   02/22/16 0321 02/22/16 0359 02/22/16 0425 02/22/16 0500  BP: 162/107 162/107 154/96 138/89  Pulse: 98 98 89 85  Temp: 98.5 F (36.9 C)     TempSrc: Oral     Resp: 18  16 17   SpO2: 92%  90% 90%    MDM  Patient with schizoaffective disorder, not currently taking medications, chronic alcohol abuse here for suicidal ideations and alcohol withdrawal.  Patient reports he will cut his throat with a knife. His physical exam is unremarkable. CIWA score 4. We'll obtain basic labs. Plan to consult TTS. Discussed with attending, Dr. Betsey Holiday. Screening labs are reassuring. His alcohol level is 168-low suspicion for alcohol withdrawal seizure. Patient is medically cleared at this time, CIWA protocol is in place. Pending TTS evaluation and subsequent disposition. Final diagnoses:  Alcohol abuse  Suicidal ideation        Comer Locket, PA-C 02/22/16 0540  Orpah Greek, MD 02/22/16 [phone]

## 2016-02-22 NOTE — H&P (Signed)
[facility] OBS UNIT H&P  Patient Identification: [name] MRN:  [id number] Date of Evaluation:  02/22/2016 Chief Complaint:  etoh use disorder,severe Principal Diagnosis: Alcohol-induced mood disorder ([place]) Diagnosis:   Patient Active Problem List   Diagnosis Date Noted  . Alcohol-induced mood disorder ([place]) [F10.94] 02/22/2016    Priority: High  . Schizoaffective disorder, depressive type ([place]) [F25.1]     Priority: High  . Alcohol dependence with uncomplicated withdrawal ([place]) [F10.230] 02/22/2016  . Gout [M10.9] 10/22/2015  . GERD (gastroesophageal reflux disease) [K21.9] 10/22/2015  . Flu-like symptoms [R68.89] 10/22/2015  . Aspiration pneumonia ([place]) [J69.0] 10/03/2015  . Alcohol withdrawal seizure with complication ([place]) [X21.194, R56.9] 09/30/2015  . History of alcohol abuse [F10.10]   . Current smoker [Z72.0] 11/20/2014  . Post traumatic stress disorder (PTSD) [F43.10] 11/03/2011   Subjective: Pt seen and chart reviewed. Pt is alert/oriented x4, calm, cooperative, and appropriate to situation. Pt denies homicidal ideation and psychosis and does not appear to be responding to internal stimuli. Pt reports that he has been struggling with alcohol abuse and would like short-term detox. Pt known to this provider from prior evaluations. Will observe overnight for safety and stabilization.   History of Present Illness: I have reviewed and concur with HPI elements, modified as follows: [name] is an 45 y.o. male.  -Clinician reviewed note by Ryan Folds, PA. Patient brought in by EMS. Having thoughts of killing himself by stabbing self in neck. No HI. Has been hearing voices putting him down and telling him to kill self. Visual hallucinations of shadows and vampires. Mainly brought in regarding discomfort from drinking and a possible seizure earlier from drinking.  Patient expresses restlessness and nausea from drinking. BAL was 168 at 04:00. Patient says he has been  drinking up to a case for the last few weeks. Patient says that he had been drinking less before the last few weeks. Increase in depression correlates to increase in drinking. Patient says that his brother is staying with him. Brother told him that patient looked like he had a seizure there at home. Patient is not sure. Patient uses marijuana once in a while. "It is not a regular thing with me."  Patient having SI w/ plan to stab self in neck. No HI. Seeing shadowy people and vampires in the last few days. Voices telling him negative things. Patient says that "I always have this kind of thing."   Patient has med management through [facility]. Next appt is 03/20/16. Patient says he goes to a schizophrenia support group most Tuesdays. That is run by the [facility]. Patient was at [facility] in February '17. He has had numerous hospitalizations in the past, to include [facility] & [facility].    Total Time spent with patient: 50 minutes   Past Psychiatric History: depression, schizoaffective disorder, substance abuse  Risk to Self: Is patient at risk for suicide?: Yes Risk to Others:   Prior Inpatient Therapy:   Prior Outpatient Therapy:    Alcohol Screening: 1. How often do you have a drink containing alcohol?: Monthly or less 2. How many drinks containing alcohol do you have on a typical day when you are drinking?: 10 or more 3. How often do you have six or more drinks on one occasion?: Monthly Preliminary Score: 6 4. How often during the last year have you found that you were not able to stop drinking once you had started?: Monthly 5. How often during the last year have you failed to do  what was normally expected from you becasue of drinking?: Monthly 6. How often during the last year have you needed a first drink in the morning to get yourself going after a heavy drinking session?: Monthly 7. How often during the last year have you had a feeling of guilt of remorse after drinking?:  Monthly 8. How often during the last year have you been unable to remember what happened the night before because you had been drinking?: Monthly 9. Have you or someone else been injured as a result of your drinking?: No 10. Has a relative or friend or a doctor or another health worker been concerned about your drinking or suggested you cut down?: Yes, during the last year Alcohol Use Disorder Identification Test Final Score (AUDIT): 21 Brief Intervention: Yes Substance Abuse History in the last 12 months:  Yes.   Consequences of Substance Abuse: NA Previous Psychotropic Medications: Yes  Psychological Evaluations: Yes  Past Medical History:  Past Medical History  Diagnosis Date  . Psychosis   . PTSD (post-traumatic stress disorder)   . Seizure disorder ([place])     related to etoh seizure  . Alcohol abuse   . Schizoaffective disorder   . Anxiety     at age 12  . Depression     at age 11    Past Surgical History  Procedure Laterality Date  . Foot surgery      right  . Hand surgery     Family History:  Family History  Problem Relation Age of Onset  . Alcoholism Mother   . CAD Father   . Depression Brother    Family Psychiatric  History:  See above Social History:  History  Alcohol Use  . 0.0 oz/week  . 0 Standard drinks or equivalent per week    Comment: "I'm a binge drinker"  Last drink on November 2016     History  Drug Use  . 1.00 per week  . Special: Marijuana    Comment: THC 2 to 3 times per month. last use 09/24/2015    Social History   Social History  . Marital Status: Divorced    Spouse Name: N/A  . Number of Children: N/A  . Years of Education: N/A   Social History Main Topics  . Smoking status: Current Every Day Smoker -- 1.00 packs/day for 24 years    Types: Cigarettes  . Smokeless tobacco: Never Used  . Alcohol Use: 0.0 oz/week    0 Standard drinks or equivalent per week     Comment: "I'm a binge drinker"  Last drink on November 2016  . Drug  Use: 1.00 per week    Special: Marijuana     Comment: THC 2 to 3 times per month. last use 09/24/2015  . Sexual Activity: Yes    Birth Control/ Protection: Condom   Other Topics Concern  . None   Social History Narrative   Additional Social History:   Allergies:   Allergies  Allergen Reactions  . Wellbutrin [Bupropion] Other (See Comments)    Makes me feel like I'm have a seizure   Lab Results:  Results for orders placed or performed during the hospital encounter of 02/22/16 (from the past 48 hour(s))  Comprehensive metabolic panel     Status: Abnormal   Collection Time: 02/22/16  4:00 AM  Result Value Ref Range   Sodium 136 135 - 145 mmol/L   Potassium 3.7 3.5 - 5.1 mmol/L   Chloride 90 (L) 101 -  111 mmol/L   CO2 24 22 - 32 mmol/L   Glucose, Bld 91 65 - 99 mg/dL   BUN 10 6 - 20 mg/dL   Creatinine, Ser 0.90 0.61 - 1.24 mg/dL   Calcium 9.1 8.9 - 10.3 mg/dL   Total Protein 8.5 (H) 6.5 - 8.1 g/dL   Albumin 5.3 (H) 3.5 - 5.0 g/dL   AST 59 (H) 15 - 41 U/L   ALT 56 17 - 63 U/L   Alkaline Phosphatase 90 38 - 126 U/L   Total Bilirubin 0.9 0.3 - 1.2 mg/dL   GFR calc non Af Amer >60 >60 mL/min   GFR calc Af Amer >60 >60 mL/min    Comment: (NOTE) The eGFR has been calculated using the CKD EPI equation. This calculation has not been validated in all clinical situations. eGFR's persistently <60 mL/min signify possible Chronic Kidney Disease.    Anion gap 22 (H) 5 - 15  CBC with Differential     Status: None   Collection Time: 02/22/16  4:00 AM  Result Value Ref Range   WBC 6.4 4.0 - 10.5 K/uL   RBC 5.26 4.22 - 5.81 MIL/uL   Hemoglobin 16.7 13.0 - 17.0 g/dL   HCT 47.9 39.0 - 52.0 %   MCV 91.1 78.0 - 100.0 fL   MCH 31.7 26.0 - 34.0 pg   MCHC 34.9 30.0 - 36.0 g/dL   RDW 13.8 11.5 - 15.5 %   Platelets 306 150 - 400 K/uL   Neutrophils Relative % 62 %   Neutro Abs 4.0 1.7 - 7.7 K/uL   Lymphocytes Relative 23 %   Lymphs Abs 1.4 0.7 - 4.0 K/uL   Monocytes Relative 14 %    Monocytes Absolute 0.9 0.1 - 1.0 K/uL   Eosinophils Relative 0 %   Eosinophils Absolute 0.0 0.0 - 0.7 K/uL   Basophils Relative 1 %   Basophils Absolute 0.1 0.0 - 0.1 K/uL  Lipase, blood     Status: None   Collection Time: 02/22/16  4:00 AM  Result Value Ref Range   Lipase 35 11 - 51 U/L  Ethanol     Status: Abnormal   Collection Time: 02/22/16  4:00 AM  Result Value Ref Range   Alcohol, Ethyl (B) 168 (H) <5 mg/dL    Comment:        LOWEST DETECTABLE LIMIT FOR SERUM ALCOHOL IS 5 mg/dL FOR MEDICAL PURPOSES ONLY   Salicylate level     Status: None   Collection Time: 02/22/16  4:00 AM  Result Value Ref Range   Salicylate Lvl <7.3 2.8 - 30.0 mg/dL  Acetaminophen level     Status: Abnormal   Collection Time: 02/22/16  4:00 AM  Result Value Ref Range   Acetaminophen (Tylenol), Serum <10 (L) 10 - 30 ug/mL    Comment:        THERAPEUTIC CONCENTRATIONS VARY SIGNIFICANTLY. A RANGE OF 10-30 ug/mL MAY BE AN EFFECTIVE CONCENTRATION FOR MANY PATIENTS. HOWEVER, SOME ARE BEST TREATED AT CONCENTRATIONS OUTSIDE THIS RANGE. ACETAMINOPHEN CONCENTRATIONS >150 ug/mL AT 4 HOURS AFTER INGESTION AND >50 ug/mL AT 12 HOURS AFTER INGESTION ARE OFTEN ASSOCIATED WITH TOXIC REACTIONS.   Urine rapid drug screen (hosp performed)     Status: Abnormal   Collection Time: 02/22/16  5:47 AM  Result Value Ref Range   Opiates NONE DETECTED NONE DETECTED   Cocaine NONE DETECTED NONE DETECTED   Benzodiazepines NONE DETECTED NONE DETECTED   Amphetamines NONE DETECTED NONE DETECTED  Tetrahydrocannabinol POSITIVE (A) NONE DETECTED   Barbiturates NONE DETECTED NONE DETECTED    Comment:        DRUG SCREEN FOR MEDICAL PURPOSES ONLY.  IF CONFIRMATION IS NEEDED FOR ANY PURPOSE, NOTIFY LAB WITHIN 5 DAYS.        LOWEST DETECTABLE LIMITS FOR URINE DRUG SCREEN Drug Class       Cutoff (ng/mL) Amphetamine      1000 Barbiturate      200 Benzodiazepine   342 Tricyclics       876 Opiates          300 Cocaine           300 THC              50     Metabolic Disorder Labs:  Lab Results  Component Value Date   HGBA1C 5.30 09/13/2015   Lab Results  Component Value Date   PROLACTIN 13.2 10/25/2014   Lab Results  Component Value Date   CHOL 244* 11/20/2014   TRIG 228* 11/20/2014   HDL 64 11/20/2014   CHOLHDL 3.8 11/20/2014   VLDL 46* 11/20/2014   LDLCALC 134* 11/20/2014    Current Medications: Current Facility-Administered Medications  Medication Dose Route Frequency Provider Last Rate Last Dose  . acetaminophen (TYLENOL) tablet 650 mg  650 mg Oral Q6H PRN Patrecia Pour, NP      . Derrill Memo ON 02/23/2016] allopurinol (ZYLOPRIM) tablet 100 mg  100 mg Oral Daily Patrecia Pour, NP      . alum & mag hydroxide-simeth (MAALOX/MYLANTA) 200-200-20 MG/5ML suspension 30 mL  30 mL Oral Q4H PRN Patrecia Pour, NP      . gabapentin (NEURONTIN) capsule 400 mg  400 mg Oral QID Patrecia Pour, NP      . hydrOXYzine (ATARAX/VISTARIL) tablet 25 mg  25 mg Oral Q6H PRN Patrecia Pour, NP      . loperamide (IMODIUM) capsule 2-4 mg  2-4 mg Oral PRN Patrecia Pour, NP      . LORazepam (ATIVAN) tablet 1 mg  1 mg Oral Q6H PRN Patrecia Pour, NP      . LORazepam (ATIVAN) tablet 1 mg  1 mg Oral QID Patrecia Pour, NP       Followed by  . [START ON 02/24/2016] LORazepam (ATIVAN) tablet 1 mg  1 mg Oral TID Patrecia Pour, NP       Followed by  . [START ON 02/25/2016] LORazepam (ATIVAN) tablet 1 mg  1 mg Oral BID Patrecia Pour, NP       Followed by  . [START ON 02/26/2016] LORazepam (ATIVAN) tablet 1 mg  1 mg Oral Daily Patrecia Pour, NP      . magnesium hydroxide (MILK OF MAGNESIA) suspension 30 mL  30 mL Oral Daily PRN Patrecia Pour, NP      . multivitamin with minerals tablet 1 tablet  1 tablet Oral Daily Patrecia Pour, NP      . ondansetron (ZOFRAN-ODT) disintegrating tablet 4 mg  4 mg Oral Q6H PRN Patrecia Pour, NP      . pantoprazole (PROTONIX) EC tablet 40 mg  40 mg Oral BID Patrecia Pour, NP      . Derrill Memo ON  02/23/2016] QUEtiapine (SEROQUEL) tablet 200 mg  200 mg Oral q morning - 10a Patrecia Pour, NP      . QUEtiapine (SEROQUEL) tablet 600 mg  600 mg Oral QHS Asa Saunas  Lord, NP      . Derrill Memo ON 02/23/2016] sertraline (ZOLOFT) tablet 75 mg  75 mg Oral Daily Patrecia Pour, NP      . thiamine (B-1) injection 100 mg  100 mg Intramuscular Once Patrecia Pour, NP      . Derrill Memo ON 02/23/2016] thiamine (VITAMIN B-1) tablet 100 mg  100 mg Oral Daily Patrecia Pour, NP       PTA Medications: Prescriptions prior to admission  Medication Sig Dispense Refill Last Dose  . allopurinol (ZYLOPRIM) 100 MG tablet Take 1 tablet (100 mg total) by mouth daily. 30 tablet 0 Past Week at Unknown time  . gabapentin (NEURONTIN) 400 MG capsule Take 1 capsule (400 mg total) by mouth 3 (three) times daily as needed. (Patient taking differently: Take 400 mg by mouth 4 (four) times daily. ) 30 capsule 0 Past Week at Unknown time  . oseltamivir (TAMIFLU) 75 MG capsule Take 1 capsule (75 mg total) by mouth 2 (two) times daily. (Patient not taking: Reported on 02/22/2016) 10 capsule 0   . pantoprazole (PROTONIX) 40 MG tablet Take 1 tablet (40 mg total) by mouth 2 (two) times daily. 30 tablet 3 Past Week at Unknown time  . QUEtiapine (SEROQUEL) 200 MG tablet Take 200-600 mg by mouth 2 (two) times daily. 200 mg in the AM and 600 mg in the evening   Past Week at Unknown time  . sertraline (ZOLOFT) 25 MG tablet Take 3 tablets (75 mg total) by mouth daily. 90 tablet 0 Past Week at Unknown time    Musculoskeletal: Strength & Muscle Tone: within normal limits Gait & Station: normal Patient leans: N/A  Psychiatric Specialty Exam: Physical Exam  Vitals reviewed. Psychiatric: His mood appears not anxious. Thought content is not paranoid. He exhibits a depressed mood. He expresses suicidal (passive SI ) ideation. He expresses no homicidal ideation. He expresses no suicidal plans and no homicidal plans.    Review of Systems   Psychiatric/Behavioral: Positive for depression, suicidal ideas and substance abuse. Negative for hallucinations. The patient is nervous/anxious and has insomnia.   All other systems reviewed and are negative.   Blood pressure 132/83, pulse 118, temperature 98.4 F (36.9 C), temperature source Oral, resp. rate 18, height 6' (1.829 m), weight 104.781 kg (231 lb), SpO2 93 %.Body mass index is 31.32 kg/(m^2).  General Appearance: Disheveled   Eye Contact::  Poor yet improving  Speech:  Slow  Volume:  Normal  Mood:  Depressed  Affect:  Depressed  Thought Process:  Linear, logical, goal directed  Orientation:  Full (Time, Place, and Person)  Thought Content:  Symptoms, worries, concerns  Suicidal Thoughts:  Yes.  without intent/plan, fleeting thoughts  Homicidal Thoughts:  No  Memory:  Immediate;   Fair Recent;   Fair Remote;   Fair  Judgement:  Fair  Insight:  Fair  Psychomotor Activity:  Normal  Concentration:  Fair  Recall:  [employer] of Knowledge:Fair  Language: Fair  Akathisia:  Negative  Handed:  Right  AIMS (if indicated):     Assets:  Desire for Improvement  ADL's:  Impaired  Cognition: WNL  Sleep:      Treatment Plan Summary: Alcohol-induced mood disorder ([place]), stable for outpatient management but will hold overnight for stabilization   CIWA protocol and home meds  I certify that OBS unit services furnished can reasonably be expected to improve the patient's condition.   Benjamine Mola, [place]  7/7/20178:19 PM

## 2016-02-22 NOTE — ED Notes (Signed)
Report given-will transfer to observation
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
## 2016-02-22 NOTE — ED Notes (Signed)
TTS at bedside. 

## 2016-02-22 NOTE — ED Notes (Signed)
Per EMS report: pt coming from home and presents with abd pain secondary to ETOH withdrawal.  Pt reports last drink was 4 hours prior to arrival and pt normally 1 case of beer a day. Pt thinks he had a seizure about 2 hours ago but EMS did not note any post-ictal symptoms.

## 2016-02-22 NOTE — ED Notes (Signed)
Bed: [id number] Expected date:  Expected time:  Means of arrival:  Comments: abd pain/etoh

## 2016-02-22 NOTE — ED Notes (Signed)
[facility] called for transport to [facility] 

## 2016-02-22 NOTE — Progress Notes (Addendum)
Initial Interdisciplinary Treatment Plan   PATIENT STRESSORS: Health problems Loss of fiancee Medication change or noncompliance   PATIENT STRENGTHS: Ability for insight Average or above average intelligence Capable of independent living Communication skills General fund of knowledge Motivation for treatment/growth   PROBLEM LIST: Problem List/Patient Goals Date to be addressed Date deferred Reason deferred Estimated date of resolution  Being lonely 02/22/2016     Depression/Relapse 02/22/2016     Taking meds 02/22/2016     Coping well 02/22/2016     Obtaining part-time job 02/22/2016                              DISCHARGE CRITERIA:  Ability to meet basic life and health needs Improved stabilization in mood, thinking, and/or behavior Medical problems require only outpatient monitoring Motivation to continue treatment in a less acute level of care Need for constant or close observation no longer present Reduction of life-threatening or endangering symptoms to within safe limits Safe-care adequate arrangements made Verbal commitment to aftercare and medication compliance Withdrawal symptoms are absent or subacute and managed without 24-hour nursing intervention  PRELIMINARY DISCHARGE PLAN: Attend aftercare/continuing care group Outpatient therapy Return to previous living arrangement  PATIENT/FAMIILY INVOLVEMENT: This treatment plan has been presented to and reviewed with the patient, [name]. The patient has been given the opportunity to ask questions and make suggestions.  Randel Pigg 02/22/2016, 7:55 PM

## 2016-02-22 NOTE — Progress Notes (Signed)
Patient ID: [name], male   DOB: [birth year], 45 y.o.   MRN: [id number] Per State regulations 482.30 this chart was reviewed for medical necessity with respect to the patient's admission/duration of stay.    Next review date: 02/25/16  Debarah Crape, BSN, RN-BC  Case Manager

## 2016-02-23 DIAGNOSIS — F1094 Alcohol use, unspecified with alcohol-induced mood disorder: Secondary | ICD-10-CM

## 2016-02-23 DIAGNOSIS — F1024 Alcohol dependence with alcohol-induced mood disorder: Secondary | ICD-10-CM | POA: Diagnosis not present

## 2016-02-23 MED ORDER — SERTRALINE HCL 25 MG PO TABS: 75.0000 mg | ORAL_TABLET | Freq: Every day | ORAL | Status: DC

## 2016-02-23 MED ORDER — QUETIAPINE FUMARATE 200 MG PO TABS: 200.0000 mg | ORAL_TABLET | Freq: Every morning | ORAL | Status: DC

## 2016-02-23 MED ORDER — QUETIAPINE FUMARATE 300 MG PO TABS: 600.0000 mg | ORAL_TABLET | Freq: Every day | ORAL | Status: DC

## 2016-02-23 NOTE — Progress Notes (Signed)
D Pt. Denies SI and HI, no complaints of pain or discomfort noted at present time.  A Writer offered support and encouragement, administered HS medications.    R Pt. Has been resting since his arrival to [facility].  Pt. Accepted his HS medications without incidence.  Pt. Is exhibiting few signs of withdrawal (mild anxiety).  Pt. Remains safe on the unit.

## 2016-02-23 NOTE — [facility] Counselor (Signed)
Pt will be discharged after 10:00 am today and has agreed to follow up with [facility] on Monday, February 25, 2016 if symptoms worsen prior to his appointment. Pt already has a provider at [facility] that he sees every three months.  Pt has an appointment scheduled for March 20, 2016 witih his psychiatrist.  Redmond Pulling, MA OBS Counselor

## 2016-02-23 NOTE — Progress Notes (Signed)
Patient was discharged home with brother at 69, patient was given belongings, AVS and scripts.

## 2016-02-23 NOTE — Progress Notes (Signed)
Patient is calm and cooperative. Patient stated that he feels better today. He spoke with his mother on the phone. He stated that he was having pain in his foot because of gout. He stated that his pain was 7/10. He stated that his depression was 3/anxiety was 8/and hopelessness was 0. He denied SI, HI, and AVH.   Patient remains safe through constant monitoring with the exception of restroom times. He was offered support, encouragement, and education.  Patient verbally consented to safety contract.   Patient is receptive and compliant, will continue to monitor.

## 2016-02-23 NOTE — Discharge Summary (Signed)
Physician Discharge Summary Note  Patient:  [name] is an 45 y.o., male MRN:  [id number] DOB:  1971-06-27 Patient phone:  [phone] (home)  Patient address:   [address] [place] [address],  Total Time spent with patient: 30 minutes  Date of Admission:  02/22/2016 Date of Discharge: 02/23/2016  Reason for Admission:  Per Tele- Assessment Note-Clinician reviewed note by Ryan Folds, PA. Patient brought in by EMS. Having thoughts of killing himself by stabbing self in neck. No HI. Has been hearing voices putting him down and telling him to kill self. Visual hallucinations of shadows and vampires. Mainly brought in regarding discomfort from drinking and a possible seizure earlier from drinking.Patient expresses restlessness and nausea from drinking. BAL was 168 at 04:00. Patient says he has been drinking up to a case for the last few weeks. Patient says that he had been drinking less before the last few weeks. Increase in depression correlates to increase in drinking. Patient says that his brother is staying with him. Brother told him that patient looked like he had a seizure there at home. Patient is not sure. Patient uses marijuana once in a while. "It is not a regular thing with me."Patient having SI w/ plan to stab self in neck. No HI. Seeing shadowy people and vampires in the last few days. Voices telling him negative things. Patient says that "I always have this kind of thing." Patient has med management through [facility]. Next appt is 03/20/16. Patient says he goes to a schizophrenia support group most Tuesdays. That is run by the [facility]. Patient was at [facility] in February '17. He has had numerous hospitalizations in the past, to include [facility] & [facility].   Principal Problem: Alcohol-induced mood disorder [name]) Discharge Diagnoses: Patient Active Problem List   Diagnosis Date Noted  . Alcohol dependence with uncomplicated withdrawal ([place])  [F10.230] 02/22/2016  . Alcohol-induced mood disorder ([place]) [F10.94] 02/22/2016  . Gout [M10.9] 10/22/2015  . GERD (gastroesophageal reflux disease) [K21.9] 10/22/2015  . Flu-like symptoms [R68.89] 10/22/2015  . Aspiration pneumonia ([place]) [J69.0] 10/03/2015  . Alcohol withdrawal seizure with complication ([place]) 0000000, R56.9] 09/30/2015  . Schizoaffective disorder, depressive type ([place]) [F25.1]   . History of alcohol abuse [F10.10]   . Current smoker [Z72.0] 11/20/2014  . Post traumatic stress disorder (PTSD) [F43.10] 11/03/2011    Past Psychiatric History: See Above  Past Medical History:  Past Medical History  Diagnosis Date  . Psychosis   . PTSD (post-traumatic stress disorder)   . Seizure disorder ([place])     related to etoh seizure  . Alcohol abuse   . Schizoaffective disorder   . Anxiety     at age 80  . Depression     at age 23    Past Surgical History  Procedure Laterality Date  . Foot surgery      right  . Hand surgery     Family History:  Family History  Problem Relation Age of Onset  . Alcoholism Mother   . CAD Father   . Depression Brother    Family Psychiatric  History: See Above Social History:  History  Alcohol Use  . 0.0 oz/week  . 0 Standard drinks or equivalent per week    Comment: "I'm a binge drinker"  Last drink on November 2016     History  Drug Use  . 1.00 per week  . Special: Marijuana    Comment: THC 2 to 3 times per month. last use  09/24/2015    Social History   Social History  . Marital Status: Divorced    Spouse Name: N/A  . Number of Children: N/A  . Years of Education: N/A   Social History Main Topics  . Smoking status: Current Every Day Smoker -- 1.00 packs/day for 24 years    Types: Cigarettes  . Smokeless tobacco: Never Used  . Alcohol Use: 0.0 oz/week    0 Standard drinks or equivalent per week     Comment: "I'm a binge drinker"  Last drink on November 2016  . Drug Use: 1.00 per week    Special: Marijuana      Comment: THC 2 to 3 times per month. last use 09/24/2015  . Sexual Activity: Yes    Birth Control/ Protection: Condom   Other Topics Concern  . None   Social History Narrative    Hospital Course: [name] was admitted for Alcohol-induced mood disorder [name])  and crisis management.  Pt was treated discharged with the medications listed below under Medication List.  Medical problems were identified and treated as needed.  Home medications were restarted as appropriate.  Improvement was monitored by observation and [name] 's daily report of symptom reduction.  Emotional and mental status was monitored by daily self-inventory reports completed by [name] and clinical staff.         [name] was evaluated by the treatment team for stability and plans for continued recovery upon discharge. [name] 's motivation was an integral factor for scheduling further treatment. Employment, transportation, bed availability, health status, family support, and any pending legal issues were also considered during hospital stay. Pt was offered further treatment options upon discharge including but not limited to Residential, Intensive Outpatient, and Outpatient treatment.  [name] will follow up with the services as listed below under Follow Up Information.     Upon completion of this admission the patient was both mentally and medically stable for discharge denying suicidal/homicidal ideation, auditory/visual/tactile hallucinations, delusional thoughts and paranoia.  Patient reports he is followed by [facility] and states he has a follow-up appt on 02/25/2016.    [name] responded well to treatment with Neurontin 400 mg and Seroquel 60mg   without adverse effects. . Pt demonstrated improvement without reported or observed adverse effects to the point of stability appropriate for outpatient management. Pertinent labs include: CMP for which outpatient follow-up is  necessary for lab recheck as mentioned below. Reviewed CBC, CMP, BAL+168, and UDS+ Opiates; all unremarkable aside from noted exceptions.   Physical Findings: AIMS: Facial and Oral Movements Muscles of Facial Expression: None, normal Lips and Perioral Area: None, normal Jaw: None, normal Tongue: None, normal,Extremity Movements Upper (arms, wrists, hands, fingers): None, normal Lower (legs, knees, ankles, toes): None, normal, Trunk Movements Neck, shoulders, hips: None, normal, Overall Severity Severity of abnormal movements (highest score from questions above): None, normal Incapacitation due to abnormal movements: None, normal Patient's awareness of abnormal movements (rate only patient's report): No Awareness, Dental Status Current problems with teeth and/or dentures?: No Does patient usually wear dentures?: No  CIWA:  CIWA-Ar Total: 10 COWS:     Musculoskeletal: Strength & Muscle Tone: within normal limits Gait & Station: normal Patient leans: N/A  Psychiatric Specialty Exam: Physical Exam  ROS  Blood pressure 128/102, pulse 104, temperature 98.2 F (36.8 C), temperature source Oral, resp. rate 18, height 6' (1.829 m), weight 104.781 kg (231 lb), SpO2 93 %.Body mass index is 31.32 kg/(m^2).  General Appearance: Casual  malodors   Eye Contact:  Good  Speech:  Clear and Coherent  Volume:  Normal  Mood:  Euthymic  Affect:  Congruent  Thought Process:  Coherent  Orientation:  Full (Time, Place, and Person)  Thought Content:  Hallucinations: None  Suicidal Thoughts:  No  Homicidal Thoughts:  No  Memory:  Immediate;   Fair Recent;   Fair Remote;   Fair  Judgement:  Fair  Insight:  Fair  Psychomotor Activity:  Normal  Concentration:  Concentration: Fair  Recall:  [place] of Knowledge:  Fair  Language:  Fair  Akathisia:  No  Handed:  Right  AIMS (if indicated):     Assets:  Desire for Improvement Resilience  ADL's:  Intact  Cognition:  WNL  Sleep:            Has this patient used any form of tobacco in the last 30 days? (Cigarettes, Smokeless Tobacco, Cigars, and/or Pipes) Yes, No  Blood Alcohol level:  Lab Results  Component Value Date   ETH 168* 02/22/2016   ETH 56* [id number]    Metabolic Disorder Labs:  Lab Results  Component Value Date   HGBA1C 5.30 09/13/2015   Lab Results  Component Value Date   PROLACTIN 13.2 10/25/2014   Lab Results  Component Value Date   CHOL 244* 11/20/2014   TRIG 228* 11/20/2014   HDL 64 11/20/2014   CHOLHDL 3.8 11/20/2014   VLDL 46* 11/20/2014   LDLCALC 134* 11/20/2014    See Psychiatric Specialty Exam and Suicide Risk Assessment completed by Attending Physician prior to discharge.  Discharge destination:  Home  Is patient on multiple antipsychotic therapies at discharge:  No   Has Patient had three or more failed trials of antipsychotic monotherapy by history:  No  Recommended Plan for Multiple Antipsychotic Therapies: NA  Discharge Instructions    Activity as tolerated - No restrictions    Complete by:  As directed      Diet - low sodium heart healthy    Complete by:  As directed      Discharge instructions    Complete by:  As directed   Take all medications as prescribed. Keep all follow-up appointments as scheduled.  Do not consume alcohol or use illegal drugs while on prescription medications. Report any adverse effects from your medications to your primary care provider promptly.  In the event of recurrent symptoms or worsening symptoms, call 911, a crisis hotline, or go to the nearest emergency department for evaluation.            Medication List    STOP taking these medications        allopurinol 100 MG tablet  Commonly known as:  ZYLOPRIM     oseltamivir 75 MG capsule  Commonly known as:  TAMIFLU      TAKE these medications      Indication   gabapentin 400 MG capsule  Commonly known as:  NEURONTIN  Take 1 capsule (400 mg total) by mouth 3 (three) times daily  as needed.   Indication:  Neuropathic Pain     pantoprazole 40 MG tablet  Commonly known as:  PROTONIX  Take 1 tablet (40 mg total) by mouth 2 (two) times daily.      QUEtiapine 200 MG tablet  Commonly known as:  SEROQUEL  Take 1 tablet (200 mg total) by mouth every morning.   Indication:  mood stablilzation     QUEtiapine 300 MG tablet  Commonly known  as:  SEROQUEL  Take 2 tablets (600 mg total) by mouth at bedtime.   Indication:  mood stablization     sertraline 25 MG tablet  Commonly known as:  ZOLOFT  Take 3 tablets (75 mg total) by mouth daily.   Indication:  Major Depressive Disorder           Follow-up Information    Follow up with [facility]. Go on 02/25/2016.   Specialty:  Behavioral Health   Why:  If symptoms worsen; Pt already has a provider at [facility] that he sees every three months, but was instructed to follow up with [facility] on Monday, February 25, 2016. Pt has an appointment scheduled for March 20, 2016 witih his psychiatrist.   Contact information:   [place]  [address] [phone]       Follow-up recommendations:  Activity:  as tolerated Diet:  heart healthy  Comments:  Take all medications as prescribed. Keep all follow-up appointments as scheduled.  Do not consume alcohol or use illegal drugs while on prescription medications. Report any adverse effects from your medications to your primary care provider promptly.  In the event of recurrent symptoms or worsening symptoms, call 911, a crisis hotline, or go to the nearest emergency department for evaluation.   Signed: Derrill Center, NP 02/23/2016, 11:21 AM

## 2016-03-04 ENCOUNTER — Telehealth: Payer: Self-pay | Admitting: Pediatrics

## 2016-03-04 ENCOUNTER — Telehealth: Payer: Self-pay | Admitting: Family Medicine

## 2016-03-04 DIAGNOSIS — K219 Gastro-esophageal reflux disease without esophagitis: Secondary | ICD-10-CM

## 2016-03-04 MED ORDER — PANTOPRAZOLE SODIUM 40 MG PO TBEC: 40.0000 mg | DELAYED_RELEASE_TABLET | Freq: Two times a day (BID) | ORAL | Status: DC

## 2016-03-04 MED ORDER — ALLOPURINOL 100 MG PO TABS: 100.0000 mg | ORAL_TABLET | Freq: Every day | ORAL | Status: DC

## 2016-03-04 NOTE — Telephone Encounter (Signed)
Protonix sent in for BID #30 w/ 3 refills.  Pharmacy wants to clarify SIG and QTY. Thanks.

## 2016-03-04 NOTE — Telephone Encounter (Signed)
Patient called requesting a refill for protonix and alipurinol. Please follow up.  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

## 2016-03-04 NOTE — Telephone Encounter (Signed)
Requested refills were sent to preferred pharmacy. Patient needs OV with Dr. Adrian Blackwater.

## 2016-03-20 DIAGNOSIS — F250 Schizoaffective disorder, bipolar type: Secondary | ICD-10-CM | POA: Diagnosis not present

## 2016-03-20 DIAGNOSIS — F251 Schizoaffective disorder, depressive type: Secondary | ICD-10-CM | POA: Diagnosis not present

## 2016-04-06 ENCOUNTER — Encounter (HOSPITAL_COMMUNITY): Payer: Self-pay | Admitting: Emergency Medicine

## 2016-04-06 ENCOUNTER — Emergency Department (HOSPITAL_COMMUNITY)
Admission: EM | Admit: 2016-04-06 | Discharge: 2016-04-06 | Disposition: A | Discharge status: Home / Self Care | Payer: Medicare Other | Attending: Emergency Medicine | Admitting: Emergency Medicine

## 2016-04-06 DIAGNOSIS — Z79899 Other long term (current) drug therapy: Secondary | ICD-10-CM | POA: Insufficient documentation

## 2016-04-06 DIAGNOSIS — K2970 Gastritis, unspecified, without bleeding: Secondary | ICD-10-CM | POA: Diagnosis not present

## 2016-04-06 DIAGNOSIS — F17210 Nicotine dependence, cigarettes, uncomplicated: Secondary | ICD-10-CM | POA: Insufficient documentation

## 2016-04-06 DIAGNOSIS — F10920 Alcohol use, unspecified with intoxication, uncomplicated: Secondary | ICD-10-CM

## 2016-04-06 DIAGNOSIS — F10120 Alcohol abuse with intoxication, uncomplicated: Secondary | ICD-10-CM | POA: Insufficient documentation

## 2016-04-06 DIAGNOSIS — R112 Nausea with vomiting, unspecified: Secondary | ICD-10-CM | POA: Diagnosis not present

## 2016-04-06 DIAGNOSIS — R251 Tremor, unspecified: Secondary | ICD-10-CM | POA: Diagnosis not present

## 2016-04-06 LAB — CBC WITH DIFFERENTIAL/PLATELET
Basophils Absolute: 0 10*3/uL (ref 0.0–0.1)
Basophils Relative: 0 %
Eosinophils Absolute: 0.1 10*3/uL (ref 0.0–0.7)
Eosinophils Relative: 1 %
HCT: 45.6 % (ref 39.0–52.0)
Hemoglobin: 15.8 g/dL (ref 13.0–17.0)
Lymphocytes Relative: 30 %
Lymphs Abs: 2.4 10*3/uL (ref 0.7–4.0)
MCH: 31.6 pg (ref 26.0–34.0)
MCHC: 34.6 g/dL (ref 30.0–36.0)
MCV: 91.2 fL (ref 78.0–100.0)
Monocytes Absolute: 0.8 10*3/uL (ref 0.1–1.0)
Monocytes Relative: 9 %
Neutro Abs: 4.9 10*3/uL (ref 1.7–7.7)
Neutrophils Relative %: 60 %
Platelets: 333 10*3/uL (ref 150–400)
RBC: 5 MIL/uL (ref 4.22–5.81)
RDW: 14.2 % (ref 11.5–15.5)
WBC: 8.2 10*3/uL (ref 4.0–10.5)

## 2016-04-06 LAB — COMPREHENSIVE METABOLIC PANEL
ALT: 41 U/L (ref 17–63)
AST: 35 U/L (ref 15–41)
Albumin: 5 g/dL (ref 3.5–5.0)
Alkaline Phosphatase: 102 U/L (ref 38–126)
Anion gap: 15 (ref 5–15)
BUN: 11 mg/dL (ref 6–20)
CO2: 23 mmol/L (ref 22–32)
Calcium: 9.1 mg/dL (ref 8.9–10.3)
Chloride: 101 mmol/L (ref 101–111)
Creatinine, Ser: 0.84 mg/dL (ref 0.61–1.24)
GFR calc Af Amer: >60 mL/min (ref >60)
GFR calc non Af Amer: >60 mL/min (ref >60)
Glucose, Bld: 97 mg/dL (ref 65–99)
Potassium: 3.8 mmol/L (ref 3.5–5.1)
Sodium: 139 mmol/L (ref 135–145)
Total Bilirubin: 0.6 mg/dL (ref 0.3–1.2)
Total Protein: 8 g/dL (ref 6.5–8.1)

## 2016-04-06 LAB — LIPASE, BLOOD: Lipase: 25 U/L (ref 11–51)

## 2016-04-06 LAB — RAPID URINE DRUG SCREEN, HOSP PERFORMED
Amphetamines: NOT DETECTED
Barbiturates: NOT DETECTED
Benzodiazepines: NOT DETECTED
Cocaine: NOT DETECTED
Opiates: NOT DETECTED
Tetrahydrocannabinol: POSITIVE — AB

## 2016-04-06 LAB — URINE MICROSCOPIC-ADD ON: RBC / HPF: NONE SEEN RBC/hpf (ref 0–5)

## 2016-04-06 LAB — URINALYSIS, ROUTINE W REFLEX MICROSCOPIC
Glucose, UA: NEGATIVE mg/dL
Hgb urine dipstick: NEGATIVE
Ketones, ur: 40 mg/dL — AB
Nitrite: NEGATIVE
Protein, ur: 100 mg/dL — AB
Specific Gravity, Urine: 1.025 (ref 1.005–1.030)
pH: 6 (ref 5.0–8.0)

## 2016-04-06 LAB — ETHANOL: Alcohol, Ethyl (B): 129 mg/dL — ABNORMAL HIGH (ref <5)

## 2016-04-06 MED ORDER — CHLORDIAZEPOXIDE HCL 25 MG PO CAPS: ORAL_CAPSULE | ORAL | 0 refills | Status: DC

## 2016-04-06 MED ORDER — ONDANSETRON HCL 4 MG/2ML IJ SOLN
4.0000 mg | Freq: Once | INTRAMUSCULAR | Status: AC
  Administered 2016-04-06: 4 mg via INTRAVENOUS
  Filled 2016-04-06: qty 2

## 2016-04-06 MED ORDER — SODIUM CHLORIDE 0.9 % IV SOLN
INTRAVENOUS | Status: DC
  Administered 2016-04-06: 11:00:00 via INTRAVENOUS

## 2016-04-06 MED ORDER — SODIUM CHLORIDE 0.9 % IV BOLUS (SEPSIS)
1000.0000 mL | Freq: Once | INTRAVENOUS | Status: AC
  Administered 2016-04-06: 1000 mL via INTRAVENOUS

## 2016-04-06 MED ORDER — LORAZEPAM 2 MG/ML IJ SOLN
1.0000 mg | Freq: Once | INTRAMUSCULAR | Status: AC
  Administered 2016-04-06: 1 mg via INTRAVENOUS
  Filled 2016-04-06: qty 1

## 2016-04-06 NOTE — ED Notes (Signed)
Pt ambulated out of department without issue.  No reaction to medication noted.  Pt given directions to a pharmacy.

## 2016-04-06 NOTE — ED Triage Notes (Signed)
Pt in alcohol withdrawal, states he consumed a case of beer last evening. Pt states his brother found him on the floor this morning approximately 0800 and patient states he thinks he had a seizure. Pt with hx of seizure when going through alcohol withdrawal. Pt states he has a bump on head, c/o nausea. Given Zofran IV by ems.

## 2016-04-06 NOTE — ED Provider Notes (Signed)
[facility] DEPT Provider Note   CSN: [id number] Arrival date & time: 04/06/16  [id number]     History   Chief Complaint Chief Complaint  Patient presents with  . Alcohol Intoxication    HPI [name] is a 45 y.o. male.  HPI Pt states he is having alcohol withdrawals.  He was drinking last night.  He is not sure of the time of his last drink.  He was lying on the floor this am.  He is not sure if he had a seizure. He is having some abdominal cramping.    No vomiting or diarrhea.  He has had nausea and he feels tremulous.  Past Medical History:  Diagnosis Date  . Alcohol abuse   . Anxiety    at age 17  . Depression    at age 70  . Psychosis   . PTSD (post-traumatic stress disorder)   . Schizoaffective disorder   . Seizure disorder ([place])    related to etoh seizure    Patient Active Problem List   Diagnosis Date Noted  . Alcohol dependence with uncomplicated withdrawal ([place]) 02/22/2016  . Alcohol-induced mood disorder ([place]) 02/22/2016  . Gout 10/22/2015  . GERD (gastroesophageal reflux disease) 10/22/2015  . Flu-like symptoms 10/22/2015  . Aspiration pneumonia ([place]) 10/03/2015  . Alcohol withdrawal seizure with complication ([place]) [id number]  . Schizoaffective disorder, depressive type ([place])   . History of alcohol abuse   . Current smoker 11/20/2014  . Post traumatic stress disorder (PTSD) 11/03/2011    Past Surgical History:  Procedure Laterality Date  . FOOT SURGERY     right  . HAND SURGERY         Home Medications    Prior to Admission medications   Medication Sig Start Date End Date Taking? Authorizing Provider  allopurinol (ZYLOPRIM) 100 MG tablet Take 100 mg by mouth daily.   Yes Historical Provider, MD  gabapentin (NEURONTIN) 400 MG capsule Take 400 mg by mouth 4 (four) times daily.   Yes Historical Provider, MD  naproxen sodium (ANAPROX) 220 MG tablet Take 220 mg by mouth 2 (two) times daily as needed (for pain).   Yes Historical Provider, MD    pantoprazole (PROTONIX) 40 MG tablet Take 1 tablet (40 mg total) by mouth 2 (two) times daily. 03/04/16  Yes Josalyn Funches, MD  QUEtiapine (SEROQUEL) 200 MG tablet Take 1 tablet (200 mg total) by mouth every morning. 02/23/16  Yes Derrill Center, NP  QUEtiapine (SEROQUEL) 300 MG tablet Take 2 tablets (600 mg total) by mouth at bedtime. 02/23/16  Yes Derrill Center, NP  sertraline (ZOLOFT) 25 MG tablet Take 3 tablets (75 mg total) by mouth daily. 02/23/16  Yes Derrill Center, NP  chlordiazePOXIDE (LIBRIUM) 25 MG capsule 50mg  PO TID x 1D, then 25-50mg  PO BID X 1D, then 25-50mg  PO QD X 1D 04/06/16   Dorie Rank, MD    Family History Family History  Problem Relation Age of Onset  . Alcoholism Mother   . CAD Father   . Depression Brother     Social History Social History  Substance Use Topics  . Smoking status: Current Every Day Smoker    Packs/day: 1.00    Years: 24.00    Types: Cigarettes  . Smokeless tobacco: Never Used  . Alcohol use 0.0 oz/week     Comment: "I'm a binge drinker"  Last drink on November 2016     Allergies   Wellbutrin [bupropion]   Review of Systems  Review of Systems  All other systems reviewed and are negative.    Physical Exam Updated Vital Signs BP 149/96   Pulse 97   Temp 98.5 F (36.9 C) (Oral)   Resp 24   SpO2 90%   Physical Exam  Constitutional: He appears well-developed and well-nourished. No distress.  HENT:  Head: Normocephalic and atraumatic.  Right Ear: External ear normal.  Left Ear: External ear normal.  Eyes: Conjunctivae are normal. Right eye exhibits no discharge. Left eye exhibits no discharge. No scleral icterus.  Neck: Neck supple. No tracheal deviation present.  Cardiovascular: Normal rate, regular rhythm and intact distal pulses.   Pulmonary/Chest: Effort normal and breath sounds normal. No stridor. No respiratory distress. He has no wheezes. He has no rales.  Abdominal: Soft. Bowel sounds are normal. He exhibits no distension.  There is no tenderness. There is no rebound and no guarding.  Musculoskeletal: He exhibits no edema or tenderness.  Neurological: He is alert. He has normal strength. He displays tremor. No cranial nerve deficit (no facial droop, extraocular movements intact, no slurred speech) or sensory deficit. He exhibits normal muscle tone. He displays no seizure activity. Coordination normal.  Skin: Skin is warm and dry. No rash noted.  Psychiatric: He has a normal mood and affect.  Nursing note and vitals reviewed.    ED Treatments / Results  Labs (all labs ordered are listed, but only abnormal results are displayed) Labs Reviewed  URINALYSIS, ROUTINE W REFLEX MICROSCOPIC (NOT AT [facility]) - Abnormal; Notable for the following:       Result Value   Color, Urine AMBER (*)    Bilirubin Urine SMALL (*)    Ketones, ur 40 (*)    Protein, ur 100 (*)    Leukocytes, UA SMALL (*)    All other components within normal limits  ETHANOL - Abnormal; Notable for the following:    Alcohol, Ethyl (B) 129 (*)    All other components within normal limits  URINE RAPID DRUG SCREEN, HOSP PERFORMED - Abnormal; Notable for the following:    Tetrahydrocannabinol POSITIVE (*)    All other components within normal limits  URINE MICROSCOPIC-ADD ON - Abnormal; Notable for the following:    Squamous Epithelial / LPF 0-5 (*)    Bacteria, UA RARE (*)    All other components within normal limits  COMPREHENSIVE METABOLIC PANEL  LIPASE, BLOOD  CBC WITH DIFFERENTIAL/PLATELET    EKG  EKG Interpretation  Date/Time:  Sunday April 06 2016 10:30:19 EDT Ventricular Rate:  89 PR Interval:    QRS Duration: 85 QT Interval:  366 QTC Calculation: 446 R Axis:   65 Text Interpretation:  Sinus rhythm qt duration decreased since last tracing Confirmed by Ria Redcay  MD-J, Candie Gintz ([id number]) on 04/06/2016 10:52:04 AM       Procedures Procedures (including critical care time)  Medications Ordered in ED Medications  sodium chloride 0.9 %  bolus 1,000 mL (0 mLs Intravenous Stopped 04/06/16 1310)    And  0.9 %  sodium chloride infusion ( Intravenous New Bag/Given 04/06/16 1116)  ondansetron (ZOFRAN) injection 4 mg (4 mg Intravenous Given 04/06/16 1117)  LORazepam (ATIVAN) injection 1 mg (1 mg Intravenous Given 04/06/16 1117)     Initial Impression / Assessment and Plan / ED Course  I have reviewed the triage vital signs and the nursing notes.  Pertinent labs & imaging results that were available during my care of the patient were reviewed by me and considered in my medical decision  making (see chart for details).  Clinical Course  Comment By Time  Labs are reassuring. No evidence of pancreatitis.  alcohol level is still elevated here in the emergency room. Dorie Rank, MD 08/20 1331  Patient was reassessed. No tremor.    Dorie Rank, MD 08/20 1332    Patient presents to emergency room with complaints of alcohol withdrawal. He still has alcohol in his bloodstream. Patient is not tachycardic or tremulous. He was given a dose of Ativan here in the emergency room. The patient is alert and oriented.   No DTs.  We'll discharge home with prescription for Librium and recommend outpatient follow-up for substance abuse  Final Clinical Impressions(s) / ED Diagnoses   Final diagnoses:  Alcohol intoxication, uncomplicated ([place])    New Prescriptions New Prescriptions   CHLORDIAZEPOXIDE (LIBRIUM) 25 MG CAPSULE    50mg  PO TID x 1D, then 25-50mg  PO BID X 1D, then 25-50mg  PO QD X 1D     Dorie Rank, MD 04/06/16 1334

## 2016-04-06 NOTE — ED Notes (Signed)
Bed: [id number] Expected date:  Expected time:  Means of arrival:  Comments: 45 yo ETOH withdrawal w/ seizures

## 2016-04-06 NOTE — ED Notes (Signed)
Pt states he feels like the medication that was given to him is wearing off. Dr. Tomi Bamberger made aware.  Dr. Tomi Bamberger states he just spoke with pt and told pt he was going to discharge pt.

## 2016-04-06 NOTE — Discharge Instructions (Signed)
Follow-up with your primary care doctor. Please refer to the resource guide in your instructions to get help with your alcohol use. Take the Librium medication to help you with her withdrawal symptoms. Do not drink alcohol at the same time you are taking this medication

## 2016-04-06 NOTE — ED Notes (Signed)
Urinal left at bedside and notified patient of pending sample needed.

## 2016-05-31 ENCOUNTER — Encounter (HOSPITAL_COMMUNITY): Payer: Self-pay | Admitting: Nurse Practitioner

## 2016-05-31 ENCOUNTER — Emergency Department (HOSPITAL_COMMUNITY)
Admission: EM | Admit: 2016-05-31 | Discharge: 2016-06-01 | Disposition: A | Discharge status: Home / Self Care | Payer: Medicare Other | Attending: Emergency Medicine | Admitting: Emergency Medicine

## 2016-05-31 DIAGNOSIS — F10239 Alcohol dependence with withdrawal, unspecified: Secondary | ICD-10-CM | POA: Diagnosis present

## 2016-05-31 DIAGNOSIS — Z79899 Other long term (current) drug therapy: Secondary | ICD-10-CM | POA: Insufficient documentation

## 2016-05-31 DIAGNOSIS — F1010 Alcohol abuse, uncomplicated: Secondary | ICD-10-CM

## 2016-05-31 DIAGNOSIS — F17210 Nicotine dependence, cigarettes, uncomplicated: Secondary | ICD-10-CM | POA: Insufficient documentation

## 2016-05-31 LAB — MAGNESIUM: Magnesium: 2.2 mg/dL (ref 1.7–2.4)

## 2016-05-31 MED ORDER — LORAZEPAM 2 MG/ML IJ SOLN
0.0000 mg | Freq: Four times a day (QID) | INTRAMUSCULAR | Status: DC
  Administered 2016-05-31: 2 mg via INTRAVENOUS
  Filled 2016-05-31: qty 1

## 2016-05-31 MED ORDER — VITAMIN B-1 100 MG PO TABS: 100.0000 mg | ORAL_TABLET | Freq: Every day | ORAL | Status: DC

## 2016-05-31 MED ORDER — THIAMINE HCL 100 MG/ML IJ SOLN: 100.0000 mg | Freq: Every day | INTRAMUSCULAR | Status: DC

## 2016-05-31 MED ORDER — LORAZEPAM 2 MG/ML IJ SOLN: 0.0000 mg | Freq: Two times a day (BID) | INTRAMUSCULAR | Status: DC

## 2016-05-31 NOTE — ED Provider Notes (Signed)
[place] DEPT Provider Note   CSN: [id number] Arrival date & time: 05/31/16  2056     History   Chief Complaint Chief Complaint  Patient presents with  . Withdrawal    HPI [name] is a 45 y.o. male.  This is a 45 year old man with a history of alcohol abuse who presents after experiencing a seizure. Per patient's brother, two hours prior to presentation the patient began experiencing full body convulsions for approximately 2 minutes. The patient was confused after the episode for approximately 30 minutes and reports no memory of the event. He did not experience bowel/bladder incontinence. Patient reports he experienced a similar episode 2 years ago from alcohol withdrawal. Patient recently witnessed his girlfriend kill herself and his using alcohol to cope. For the last several weeks he has been consuming 24 cans of malt liquor per day. His last drink occurred 5-7 hours prior to this episode. Patient says he has attempted suicide four times in the past but has no intention to do so today.       Past Medical History:  Diagnosis Date  . Alcohol abuse   . Anxiety    at age 20  . Depression    at age 20  . Psychosis   . PTSD (post-traumatic stress disorder)   . Schizoaffective disorder   . Seizure disorder ([place])    related to etoh seizure    Patient Active Problem List   Diagnosis Date Noted  . Alcohol dependence with uncomplicated withdrawal ([place]) 02/22/2016  . Alcohol-induced mood disorder ([place]) 02/22/2016  . Gout 10/22/2015  . GERD (gastroesophageal reflux disease) 10/22/2015  . Flu-like symptoms 10/22/2015  . Aspiration pneumonia ([address]) 10/03/2015  . Alcohol withdrawal seizure with complication ([place]) [id number]  . Schizoaffective disorder, depressive type ([place])   . History of alcohol abuse   . Current smoker 11/20/2014  . Post traumatic stress disorder (PTSD) 11/03/2011    Past Surgical History:  Procedure Laterality Date  . FOOT SURGERY     right   . HAND SURGERY         Home Medications    Prior to Admission medications   Medication Sig Start Date End Date Taking? Authorizing Provider  gabapentin (NEURONTIN) 400 MG capsule Take 400 mg by mouth 4 (four) times daily.   Yes Historical Provider, MD  naproxen sodium (ANAPROX) 220 MG tablet Take 220 mg by mouth 2 (two) times daily as needed (for pain).   Yes Historical Provider, MD  pantoprazole (PROTONIX) 40 MG tablet Take 1 tablet (40 mg total) by mouth 2 (two) times daily. 03/04/16  Yes Josalyn Funches, MD  QUEtiapine (SEROQUEL) 200 MG tablet Take 1 tablet (200 mg total) by mouth every morning. 02/23/16  Yes Derrill Center, NP  QUEtiapine (SEROQUEL) 300 MG tablet Take 2 tablets (600 mg total) by mouth at bedtime. 02/23/16  Yes Derrill Center, NP  sertraline (ZOLOFT) 25 MG tablet Take 3 tablets (75 mg total) by mouth daily. Patient taking differently: Take 75 mg by mouth every morning.  02/23/16  Yes Derrill Center, NP  chlordiazePOXIDE (LIBRIUM) 25 MG capsule 50mg  PO TID x 1D, then 25-50mg  PO BID X 1D, then 25-50mg  PO QD X 1D 06/01/16   Isla Pence, MD    Family History Family History  Problem Relation Age of Onset  . Alcoholism Mother   . CAD Father   . Depression Brother     Social History Social History  Substance Use Topics  .  Smoking status: Current Every Day Smoker    Packs/day: 1.00    Years: 24.00    Types: Cigarettes  . Smokeless tobacco: Never Used  . Alcohol use 0.0 oz/week     Comment: "I'm a binge drinker"  Last drink on November 2016     Allergies   Wellbutrin [bupropion]   Review of Systems Review of Systems   Physical Exam Updated Vital Signs BP (!) 181/114 (BP Location: Left Arm)   Pulse 120   Temp 98 F (36.7 C) (Oral)   Resp 20   Ht 6' (1.829 m)   Wt 229 lb 7 oz (104.1 kg)   SpO2 94%   BMI 31.12 kg/m   Physical Exam  Constitutional: He is oriented to person, place, and time.  HENT:  Mouth/Throat: Abnormal dentition.  Eyes: EOM are  normal. Pupils are equal, round, and reactive to light. Right conjunctiva is injected. Left conjunctiva is injected. No scleral icterus.  Cardiovascular: Regular rhythm and normal heart sounds.  Tachycardia present.   Pulmonary/Chest: No accessory muscle usage. No tachypnea. No respiratory distress.  Abdominal: Soft. Bowel sounds are normal. There is generalized tenderness. There is no rebound and no guarding.  Neurological: He is alert and oriented to person, place, and time. No cranial nerve deficit or sensory deficit.  Intermittent, diffuse twitches noted throughout exam.  Skin: Skin is intact. He is diaphoretic.  Psychiatric: His mood appears anxious. He expresses no suicidal plans. He is attentive.     ED Treatments / Results  Labs (all labs ordered are listed, but only abnormal results are displayed) Labs Reviewed  COMPREHENSIVE METABOLIC PANEL - Abnormal; Notable for the following:       Result Value   Chloride 96 (*)    Total Protein 8.9 (*)    Albumin 5.3 (*)    AST 46 (*)    Alkaline Phosphatase 133 (*)    Anion gap 20 (*)    All other components within normal limits  ETHANOL - Abnormal; Notable for the following:    Alcohol, Ethyl (B) 215 (*)    All other components within normal limits  CBC WITH DIFFERENTIAL/PLATELET - Abnormal; Notable for the following:    Hemoglobin 17.1 (*)    All other components within normal limits  MAGNESIUM  RAPID URINE DRUG SCREEN, HOSP PERFORMED  URINALYSIS, ROUTINE W REFLEX MICROSCOPIC (NOT AT [facility])    EKG  EKG Interpretation None       Radiology No results found.  Procedures Procedures (including critical care time)  Medications Ordered in ED Medications  LORazepam (ATIVAN) injection 0-4 mg (2 mg Intravenous Given 05/31/16 2241)    Followed by  LORazepam (ATIVAN) injection 0-4 mg (not administered)  thiamine (VITAMIN B-1) tablet 100 mg (not administered)    Or  thiamine (B-1) injection 100 mg (not administered)    LORazepam (ATIVAN) injection 1 mg (not administered)  chlordiazePOXIDE (LIBRIUM) capsule 50 mg (not administered)     Initial Impression / Assessment and Plan / ED Course  I have reviewed the triage vital signs and the nursing notes.  Pertinent labs & imaging results that were available during my care of the patient were reviewed by me and considered in my medical decision making (see chart for details).  Clinical Course   Pt's alcohol level is 215, so I doubt an alcohol withdraw seizure.  He presented in July with similar possible seizure, but it was not alcohol withdraw.  The pt denies si and does not want  to talk with mental health.  He wants to go home and try an outpatient treatment program.  He will be given an additional dose of ativan prior to d/c and put on librium.  He knows to return at any time.    Final Clinical Impressions(s) / ED Diagnoses   Final diagnoses:  Alcohol abuse    New Prescriptions New Prescriptions   CHLORDIAZEPOXIDE (LIBRIUM) 25 MG CAPSULE    50mg  PO TID x 1D, then 25-50mg  PO BID X 1D, then 25-50mg  PO QD X 1D     Isla Pence, MD 06/01/16 [id number]

## 2016-05-31 NOTE — ED Triage Notes (Signed)
Pt states he had a etoh withdrawal induced seizure 3 hrs ago. Familiar to the department for similar issues, not in apparent distress at this time.

## 2016-06-01 LAB — CBC WITH DIFFERENTIAL/PLATELET
Basophils Absolute: 0.1 10*3/uL (ref 0.0–0.1)
Basophils Relative: 1 %
Eosinophils Absolute: 0.1 10*3/uL (ref 0.0–0.7)
Eosinophils Relative: 1 %
HCT: 49.2 % (ref 39.0–52.0)
Hemoglobin: 17.1 g/dL — ABNORMAL HIGH (ref 13.0–17.0)
Lymphocytes Relative: 30 %
Lymphs Abs: 2.1 10*3/uL (ref 0.7–4.0)
MCH: 32.4 pg (ref 26.0–34.0)
MCHC: 34.8 g/dL (ref 30.0–36.0)
MCV: 93.2 fL (ref 78.0–100.0)
Monocytes Absolute: 0.7 10*3/uL (ref 0.1–1.0)
Monocytes Relative: 10 %
Neutro Abs: 4.2 10*3/uL (ref 1.7–7.7)
Neutrophils Relative %: 58 %
Platelets: 338 10*3/uL (ref 150–400)
RBC: 5.28 MIL/uL (ref 4.22–5.81)
RDW: 14.5 % (ref 11.5–15.5)
WBC: 7.1 10*3/uL (ref 4.0–10.5)

## 2016-06-01 LAB — COMPREHENSIVE METABOLIC PANEL
ALT: 37 U/L (ref 17–63)
AST: 46 U/L — ABNORMAL HIGH (ref 15–41)
Albumin: 5.3 g/dL — ABNORMAL HIGH (ref 3.5–5.0)
Alkaline Phosphatase: 133 U/L — ABNORMAL HIGH (ref 38–126)
Anion gap: 20 — ABNORMAL HIGH (ref 5–15)
BUN: 6 mg/dL (ref 6–20)
CO2: 23 mmol/L (ref 22–32)
Calcium: 9.5 mg/dL (ref 8.9–10.3)
Chloride: 96 mmol/L — ABNORMAL LOW (ref 101–111)
Creatinine, Ser: 0.88 mg/dL (ref 0.61–1.24)
GFR calc Af Amer: >60 mL/min (ref >60)
GFR calc non Af Amer: >60 mL/min (ref >60)
Glucose, Bld: 99 mg/dL (ref 65–99)
Potassium: 3.9 mmol/L (ref 3.5–5.1)
Sodium: 139 mmol/L (ref 135–145)
Total Bilirubin: 0.5 mg/dL (ref 0.3–1.2)
Total Protein: 8.9 g/dL — ABNORMAL HIGH (ref 6.5–8.1)

## 2016-06-01 LAB — ETHANOL: Alcohol, Ethyl (B): 215 mg/dL — ABNORMAL HIGH (ref <5)

## 2016-06-01 MED ORDER — LORAZEPAM 2 MG/ML IJ SOLN
1.0000 mg | Freq: Once | INTRAMUSCULAR | Status: AC
  Administered 2016-06-01: 1 mg via INTRAVENOUS
  Filled 2016-06-01: qty 1

## 2016-06-01 MED ORDER — CHLORDIAZEPOXIDE HCL 25 MG PO CAPS
50.0000 mg | ORAL_CAPSULE | Freq: Once | ORAL | Status: AC
  Administered 2016-06-01: 50 mg via ORAL
  Filled 2016-06-01: qty 2

## 2016-06-01 MED ORDER — CHLORDIAZEPOXIDE HCL 25 MG PO CAPS: ORAL_CAPSULE | ORAL | 0 refills | Status: DC

## 2016-06-01 NOTE — ED Notes (Signed)
Patient is alert and oriented x3.  He was given DC instructions and follow up visit instructions.  Patient gave verbal understanding.  He was DC ambulatory under his own power to home.  V/S stable.  He was not showing any signs of distress on DC 

## 2016-06-11 DIAGNOSIS — F250 Schizoaffective disorder, bipolar type: Secondary | ICD-10-CM | POA: Diagnosis not present

## 2016-06-11 DIAGNOSIS — F251 Schizoaffective disorder, depressive type: Secondary | ICD-10-CM | POA: Diagnosis not present

## 2016-07-16 ENCOUNTER — Telehealth: Payer: Self-pay | Admitting: Family Medicine

## 2016-07-16 DIAGNOSIS — M1A9XX0 Chronic gout, unspecified, without tophus (tophi): Secondary | ICD-10-CM

## 2016-07-16 NOTE — Telephone Encounter (Signed)
Patient called the office to talk to PCP regarding the pain he is currently experiencing. Pt cant walk or stand for too long. He said that his pain is a 10. Patient stated that he was previous given medication for gout and it helped. Please call medication in to [place].   Thank you.

## 2016-07-17 MED ORDER — COLCHICINE 0.6 MG PO TABS: ORAL_TABLET | ORAL | 0 refills | Status: DC

## 2016-07-17 NOTE — Telephone Encounter (Signed)
Sent colchicine to patient's pharmacy for suspected gout flare  Patient advised to f/u if pain persist

## 2016-07-17 NOTE — Telephone Encounter (Signed)
Will route to PCP 

## 2016-07-17 NOTE — Telephone Encounter (Signed)
Pt was called and informed of medication being sent over to his pharmacy.

## 2016-08-21 ENCOUNTER — Ambulatory Visit: Payer: Medicare Other | Attending: Family Medicine | Admitting: Family Medicine

## 2016-08-21 ENCOUNTER — Encounter: Payer: Self-pay | Admitting: Family Medicine

## 2016-08-21 VITALS — BP 132/83 | HR 77 | Temp 98.0°F | Ht 72.0 in | Wt 258.0 lb

## 2016-08-21 DIAGNOSIS — M545 Low back pain, unspecified: Secondary | ICD-10-CM | POA: Insufficient documentation

## 2016-08-21 DIAGNOSIS — Z79899 Other long term (current) drug therapy: Secondary | ICD-10-CM | POA: Insufficient documentation

## 2016-08-21 DIAGNOSIS — F17210 Nicotine dependence, cigarettes, uncomplicated: Secondary | ICD-10-CM | POA: Diagnosis not present

## 2016-08-21 DIAGNOSIS — K219 Gastro-esophageal reflux disease without esophagitis: Secondary | ICD-10-CM

## 2016-08-21 DIAGNOSIS — M1A9XX0 Chronic gout, unspecified, without tophus (tophi): Secondary | ICD-10-CM

## 2016-08-21 DIAGNOSIS — G8929 Other chronic pain: Secondary | ICD-10-CM | POA: Diagnosis not present

## 2016-08-21 MED ORDER — ALLOPURINOL 100 MG PO TABS: 100.0000 mg | ORAL_TABLET | Freq: Every day | ORAL | 6 refills | Status: DC

## 2016-08-21 MED ORDER — COLCHICINE 0.6 MG PO TABS: ORAL_TABLET | ORAL | 5 refills | Status: DC

## 2016-08-21 MED ORDER — CYCLOBENZAPRINE HCL 10 MG PO TABS: 10.0000 mg | ORAL_TABLET | Freq: Every day | ORAL | 2 refills | Status: DC

## 2016-08-21 MED ORDER — PANTOPRAZOLE SODIUM 40 MG PO TBEC: 40.0000 mg | DELAYED_RELEASE_TABLET | Freq: Two times a day (BID) | ORAL | 11 refills | Status: DC

## 2016-08-21 NOTE — Patient Instructions (Signed)
[name] was seen today for back pain.  Diagnoses and all orders for this visit:  Chronic gout without tophus, unspecified cause, unspecified site -     Uric acid -     allopurinol (ZYLOPRIM) 100 MG tablet; Take 1 tablet (100 mg total) by mouth daily. -     colchicine 0.6 MG tablet; Take by mouth 0.6 mg by mouth, then 0.6 2 hrs later, followed by 0.6 mg daily until gout flare resolves.  Chronic bilateral low back pain without sciatica -     cyclobenzaprine (FLEXERIL) 10 MG tablet; Take 1 tablet (10 mg total) by mouth at bedtime.   F.u in 6 weeks for low back pain  Dr. Adrian Blackwater   Back Exercises Introduction If you have pain in your back, do these exercises 2-3 times each day or as told by your doctor. When the pain goes away, do the exercises once each day, but repeat the steps more times for each exercise (do more repetitions). If you do not have pain in your back, do these exercises once each day or as told by your doctor. Exercises Single Knee to Chest  Do these steps 3-5 times in a row for each leg: 1. Lie on your back on a firm bed or the floor with your legs stretched out. 2. Bring one knee to your chest. 3. Hold your knee to your chest by grabbing your knee or thigh. 4. Pull on your knee until you feel a gentle stretch in your lower back. 5. Keep doing the stretch for 10-30 seconds. 6. Slowly let go of your leg and straighten it. Pelvic Tilt  Do these steps 5-10 times in a row: 1. Lie on your back on a firm bed or the floor with your legs stretched out. 2. Bend your knees so they point up to the ceiling. Your feet should be flat on the floor. 3. Tighten your lower belly (abdomen) muscles to press your lower back against the floor. This will make your tailbone point up to the ceiling instead of pointing down to your feet or the floor. 4. Stay in this position for 5-10 seconds while you gently tighten your muscles and breathe evenly. Cat-Cow  Do these steps until your lower  back bends more easily: 1. Get on your hands and knees on a firm surface. Keep your hands under your shoulders, and keep your knees under your hips. You may put padding under your knees. 2. Let your head hang down, and make your tailbone point down to the floor so your lower back is round like the back of a cat. 3. Stay in this position for 5 seconds. 4. Slowly lift your head and make your tailbone point up to the ceiling so your back hangs low (sags) like the back of a cow. 5. Stay in this position for 5 seconds. Press-Ups  Do these steps 5-10 times in a row: 1. Lie on your belly (face-down) on the floor. 2. Place your hands near your head, about shoulder-width apart. 3. While you keep your back relaxed and keep your hips on the floor, slowly straighten your arms to raise the top half of your body and lift your shoulders. Do not use your back muscles. To make yourself more comfortable, you may change where you place your hands. 4. Stay in this position for 5 seconds. 5. Slowly return to lying flat on the floor. Bridges  Do these steps 10 times in a row: 1. Lie on your back on  a firm surface. 2. Bend your knees so they point up to the ceiling. Your feet should be flat on the floor. 3. Tighten your butt muscles and lift your butt off of the floor until your waist is almost as high as your knees. If you do not feel the muscles working in your butt and the back of your thighs, slide your feet 1-2 inches farther away from your butt. 4. Stay in this position for 3-5 seconds. 5. Slowly lower your butt to the floor, and let your butt muscles relax. If this exercise is too easy, try doing it with your arms crossed over your chest. Belly Crunches  Do these steps 5-10 times in a row: 1. Lie on your back on a firm bed or the floor with your legs stretched out. 2. Bend your knees so they point up to the ceiling. Your feet should be flat on the floor. 3. Cross your arms over your chest. 4. Tip your chin  a little bit toward your chest but do not bend your neck. 5. Tighten your belly muscles and slowly raise your chest just enough to lift your shoulder blades a tiny bit off of the floor. 6. Slowly lower your chest and your head to the floor. Back Lifts  Do these steps 5-10 times in a row: 1. Lie on your belly (face-down) with your arms at your sides, and rest your forehead on the floor. 2. Tighten the muscles in your legs and your butt. 3. Slowly lift your chest off of the floor while you keep your hips on the floor. Keep the back of your head in line with the curve in your back. Look at the floor while you do this. 4. Stay in this position for 3-5 seconds. 5. Slowly lower your chest and your face to the floor. Contact a doctor if:  Your back pain gets a lot worse when you do an exercise.  Your back pain does not lessen 2 hours after you exercise. If you have any of these problems, stop doing the exercises. Do not do them again unless your doctor says it is okay. Get help right away if:  You have sudden, very bad back pain. If this happens, stop doing the exercises. Do not do them again unless your doctor says it is okay. This information is not intended to replace advice given to you by your health care provider. Make sure you discuss any questions you have with your health care provider. Document Released: 09/06/2010 Document Revised: 01/10/2016 Document Reviewed: 09/28/2014  2017 Elsevier

## 2016-08-21 NOTE — Assessment & Plan Note (Signed)
Low back pain without radicular symptoms or red flags  Plan: Add flexeril Advised continue prn naproxen with food Patient declined diclofenac gel Increase exercise, [employer] scholarship form provided Home exercises provided

## 2016-08-21 NOTE — Progress Notes (Signed)
Pt is here today for gout flare up, lower back pain, and neck pain. Pt pain level is 7.  Pt declined flu and t-dap today.

## 2016-08-21 NOTE — Progress Notes (Signed)
Subjective:  Patient ID: [name], male    DOB: 1971-04-07  Age: 46 y.o. MRN: [id number]  CC: Back Pain   HPI [name] presents for   1. Gout: has chronic gout. Having pain in low back with sweeping and prolonged standing. Pain x  2 years. Pain is b/l, worse on L side, aching, and tightening pain. Low back locks up 8-9/10 pain. Pain is non-radiating. He takes naproxen as needed for pain. Not daily. No leaking of urine or stool. No leg weakness. No fever, chills, weight loss. Had a fall down stairs 2.5 year ago. His pain does wake him up at nigh sometimes.  No pain in any other joints. Gout flares occur mostly in feet. He has cut down on alcohol. Last drink was nearly 3 months ago.   3. Schizoaffective: compliant with Seroquel and gabapentin to stabilize his mood. Goes to [employer]. Has gained weight. No exercising.   Social History  Substance Use Topics  . Smoking status: Current Every Day Smoker    Packs/day: 1.00    Years: 24.00    Types: Cigarettes  . Smokeless tobacco: Never Used  . Alcohol use 0.0 oz/week     Comment: "I'm a binge drinker"  Last drink on November 2016    Outpatient Medications Prior to Visit  Medication Sig Dispense Refill  . gabapentin (NEURONTIN) 400 MG capsule Take 400 mg by mouth 4 (four) times daily.    . naproxen sodium (ANAPROX) 220 MG tablet Take 220 mg by mouth 2 (two) times daily as needed (for pain).    . pantoprazole (PROTONIX) 40 MG tablet Take 1 tablet (40 mg total) by mouth 2 (two) times daily. 60 tablet 3  . QUEtiapine (SEROQUEL) 200 MG tablet Take 1 tablet (200 mg total) by mouth every morning. 10 tablet 0  . QUEtiapine (SEROQUEL) 300 MG tablet Take 2 tablets (600 mg total) by mouth at bedtime. 10 tablet 0  . sertraline (ZOLOFT) 25 MG tablet Take 3 tablets (75 mg total) by mouth daily. (Patient taking differently: Take 75 mg by mouth every morning. ) 10 tablet 0  . chlordiazePOXIDE (LIBRIUM) 25 MG capsule 50mg  PO TID x 1D, then  25-50mg  PO BID X 1D, then 25-50mg  PO QD X 1D (Patient not taking: Reported on 08/21/2016) 10 capsule 0  . colchicine 0.6 MG tablet Take by mouth 0.6 mg by mouth, then 0.6 2 hrs later, followed by 0.6 mg daily until gout flare resolves. (Patient not taking: Reported on 08/21/2016) 30 tablet 0   No facility-administered medications prior to visit.     ROS Review of Systems  Constitutional: Negative for chills, fatigue, fever and unexpected weight change.  HENT: Negative for congestion and sore throat.   Eyes: Negative for visual disturbance.  Respiratory: Negative for cough and shortness of breath.   Cardiovascular: Negative for chest pain, palpitations and leg swelling.  Gastrointestinal: Negative for abdominal pain, blood in stool, constipation, diarrhea, nausea and vomiting.  Endocrine: Negative for polydipsia, polyphagia and polyuria.  Musculoskeletal: Positive for back pain and neck pain. Negative for arthralgias, gait problem and myalgias.  Skin: Negative for rash.  Allergic/Immunologic: Negative for immunocompromised state.  Hematological: Negative for adenopathy. Does not bruise/bleed easily.  Psychiatric/Behavioral: Negative for dysphoric mood, sleep disturbance and suicidal ideas. The patient is not nervous/anxious.     Objective:  BP 132/83 (BP Location: Left Arm, Patient Position: Sitting, Cuff Size: Small)   Pulse 77   Temp 98 F (36.7 C) (Oral)  Ht 6' (1.829 m)   Wt 258 lb (117 kg)   SpO2 94%   BMI 34.99 kg/m   BP/Weight 08/21/2016 05/31/2016 99991111  Systolic BP [id number] 0000000 [id number]  Diastolic BP 83 [id number] 92  Wt. (Lbs) 258 229.44 -  BMI 34.99 31.12 -  Some encounter information is confidential and restricted. Go to Review Flowsheets activity to see all data.   Physical Exam  Constitutional: He appears well-developed and well-nourished. No distress.  HENT:  Head: Normocephalic and atraumatic.  Right Ear: Tympanic membrane, external ear and ear canal normal.  Left Ear:  Tympanic membrane, external ear and ear canal normal.  Mouth/Throat: Oropharynx is clear and moist.  Neck: Normal range of motion. Neck supple.  Cardiovascular: Normal rate, regular rhythm, normal heart sounds and intact distal pulses.   Pulmonary/Chest: Effort normal and breath sounds normal.  Musculoskeletal: He exhibits no edema.  Back Exam: Back: Normal Curvature, no deformities or CVA tenderness  Paraspinal Tenderness: left lumbar   LE Strength 5/5  LE Sensation: in tact  LE Reflexes 2+ and symmetric  Straight leg raise: negative    Lymphadenopathy:    He has no cervical adenopathy.  Neurological: He is alert.  Skin: Skin is warm and dry. No rash noted. No erythema.  Psychiatric: He has a normal mood and affect.   Lab Results  Component Value Date   HGBA1C 5.30 09/13/2015     Depression screen [facility] 2/9 08/21/2016 10/22/2015 09/13/2015 02/16/2015  Decreased Interest 1 1 1 1   Down, Depressed, Hopeless 1 1 1 1   PHQ - 2 Score 2 2 2 2   Altered sleeping 1 0 1 1  Tired, decreased energy 0 1 1 0  Change in appetite 1 1 1  0  Feeling bad or failure about yourself  1 1 1 1   Trouble concentrating 1 0 0 0  Moving slowly or fidgety/restless 0 0 0 0  Suicidal thoughts 0 1 0 0  PHQ-9 Score 6 6 6 4     GAD 7 : Generalized Anxiety Score 08/21/2016 10/22/2015 09/13/2015  Nervous, Anxious, on Edge 2 1 2   Control/stop worrying 1 1 1   Worry too much - different things 1 1 1   Trouble relaxing 0 0 1  Restless 0 0 1  Easily annoyed or irritable 0 0 1  Afraid - awful might happen 1 1 1   Total GAD 7 Score 5 4 8      Assessment & Plan:   [name] was seen today for back pain.  Diagnoses and all orders for this visit:  Chronic gout without tophus, unspecified cause, unspecified site -     Uric acid -     allopurinol (ZYLOPRIM) 100 MG tablet; Take 1 tablet (100 mg total) by mouth daily. -     colchicine 0.6 MG tablet; Take by mouth 0.6 mg by mouth, then 0.6 2 hrs later, followed by 0.6 mg daily  until gout flare resolves.  Chronic bilateral low back pain without sciatica -     cyclobenzaprine (FLEXERIL) 10 MG tablet; Take 1 tablet (10 mg total) by mouth at bedtime.   Follow-up: Return in about 6 weeks (around 10/02/2016) for low back pain .   Boykin Nearing MD

## 2016-08-21 NOTE — Assessment & Plan Note (Signed)
No flare Check uric acid Allopurinol to keep uric acid to goal < 6 Colchicine prn flare

## 2016-08-22 LAB — URIC ACID: Uric Acid, Serum: 8 mg/dL (ref 4.0–8.0)

## 2016-10-29 DIAGNOSIS — F251 Schizoaffective disorder, depressive type: Secondary | ICD-10-CM | POA: Diagnosis not present

## 2016-12-31 ENCOUNTER — Encounter: Payer: Self-pay | Admitting: Family Medicine

## 2017-01-13 DIAGNOSIS — F251 Schizoaffective disorder, depressive type: Secondary | ICD-10-CM | POA: Diagnosis not present

## 2017-01-13 DIAGNOSIS — F250 Schizoaffective disorder, bipolar type: Secondary | ICD-10-CM | POA: Diagnosis not present

## 2017-06-22 ENCOUNTER — Encounter: Payer: Self-pay | Admitting: Nurse Practitioner

## 2017-06-22 ENCOUNTER — Ambulatory Visit: Payer: Medicare Other | Attending: Nurse Practitioner | Admitting: Nurse Practitioner

## 2017-06-22 VITALS — BP 113/71 | HR 97 | Temp 98.0°F | Resp 18 | Ht 72.0 in | Wt 236.4 lb

## 2017-06-22 DIAGNOSIS — R569 Unspecified convulsions: Secondary | ICD-10-CM | POA: Diagnosis not present

## 2017-06-22 DIAGNOSIS — Z8249 Family history of ischemic heart disease and other diseases of the circulatory system: Secondary | ICD-10-CM | POA: Diagnosis not present

## 2017-06-22 DIAGNOSIS — H9202 Otalgia, left ear: Secondary | ICD-10-CM | POA: Diagnosis not present

## 2017-06-22 DIAGNOSIS — Z818 Family history of other mental and behavioral disorders: Secondary | ICD-10-CM | POA: Diagnosis not present

## 2017-06-22 DIAGNOSIS — F10230 Alcohol dependence with withdrawal, uncomplicated: Secondary | ICD-10-CM | POA: Diagnosis not present

## 2017-06-22 DIAGNOSIS — F251 Schizoaffective disorder, depressive type: Secondary | ICD-10-CM | POA: Insufficient documentation

## 2017-06-22 DIAGNOSIS — Z888 Allergy status to other drugs, medicaments and biological substances status: Secondary | ICD-10-CM | POA: Insufficient documentation

## 2017-06-22 DIAGNOSIS — F17210 Nicotine dependence, cigarettes, uncomplicated: Secondary | ICD-10-CM | POA: Diagnosis not present

## 2017-06-22 DIAGNOSIS — Z9889 Other specified postprocedural states: Secondary | ICD-10-CM | POA: Insufficient documentation

## 2017-06-22 DIAGNOSIS — J309 Allergic rhinitis, unspecified: Secondary | ICD-10-CM | POA: Diagnosis not present

## 2017-06-22 DIAGNOSIS — Z79899 Other long term (current) drug therapy: Secondary | ICD-10-CM | POA: Insufficient documentation

## 2017-06-22 DIAGNOSIS — G40909 Epilepsy, unspecified, not intractable, without status epilepticus: Secondary | ICD-10-CM | POA: Insufficient documentation

## 2017-06-22 DIAGNOSIS — M1A9XX0 Chronic gout, unspecified, without tophus (tophi): Secondary | ICD-10-CM | POA: Diagnosis not present

## 2017-06-22 DIAGNOSIS — K219 Gastro-esophageal reflux disease without esophagitis: Secondary | ICD-10-CM | POA: Diagnosis not present

## 2017-06-22 DIAGNOSIS — Z811 Family history of alcohol abuse and dependence: Secondary | ICD-10-CM | POA: Insufficient documentation

## 2017-06-22 DIAGNOSIS — F10239 Alcohol dependence with withdrawal, unspecified: Secondary | ICD-10-CM

## 2017-06-22 DIAGNOSIS — F4310 Post-traumatic stress disorder, unspecified: Secondary | ICD-10-CM | POA: Insufficient documentation

## 2017-06-22 MED ORDER — PANTOPRAZOLE SODIUM 40 MG PO TBEC: 40.0000 mg | DELAYED_RELEASE_TABLET | Freq: Two times a day (BID) | ORAL | 11 refills | Status: DC

## 2017-06-22 MED ORDER — ALLOPURINOL 100 MG PO TABS: 100.0000 mg | ORAL_TABLET | Freq: Every day | ORAL | 6 refills | Status: DC

## 2017-06-22 MED ORDER — FLUTICASONE PROPIONATE 50 MCG/ACT NA SUSP: 2.0000 | Freq: Every day | NASAL | 6 refills | Status: DC

## 2017-06-22 NOTE — Progress Notes (Signed)
Subjective:   Chief Complaint  Patient presents with  . Establish Care    Patient stated that he would like to get his meds refill. Patient stated that his left ear feels like it has water in it.    HPI  Patient seen in office today for follow up.    GERD He takes Protonix for GERD. Reports significant relief of symptoms.   Tobacco Dependence Started smoking at the age of 83. Smokes 1ppd. The longest time frame he has refrained from smoking cigarettes was 4 months while he was incarcerated. He is reluctant to quit.   Gout He has been out of allopurinol for a few weeks. He has chronic gout. Takes colchicine for acute flares. Currently taking 100mg  of allopurinol daily. Denies any acute pain at this time.  Per previous records; Gout flares occur mostly in feet. He has cut down on alcohol. Last drink was nearly 3 months ago. He avoids foods that could trigger GOUT.   Depression/Schizoaffective Receives mental health services from [facility]. He endorses medication compliance. Denies SI/HI at this time. Takes Gabapentin for side effects from his psychotropic medications.   Alcohol Dependence He has a history of alcohol dependence. He reports he has refrained from drinking alcohol since this summer. His history of seizures was related to his ETOH abuse. He denies any recent seizure activity. He does not take anti seizure medications.    Ear Pain Patient complains of left ear pain and plugged sensation in the left ear.  Onset of symptoms was several days ago, unchanged since that time. He also notes decreased hearing in the left ear.  He does not have a history of ear infections.  He does not have a history of swimming.  He has tried no medications  for his symptoms.  He does have a history of allergic rhinitis.    Past Medical History:  Diagnosis Date  . Alcohol abuse   . Anxiety    at age 60  . Depression    at age 22  . Psychosis ([place])   . PTSD (post-traumatic stress disorder)     . Schizoaffective disorder   . Seizure disorder ([employer])    related to etoh seizure    Past Surgical History:  Procedure Laterality Date  . FOOT SURGERY     right  . HAND SURGERY      Family History  Problem Relation Age of Onset  . Alcoholism Mother   . CAD Father   . Depression Brother     Social History   Socioeconomic History  . Marital status: Divorced    Spouse name: Not on file  . Number of children: Not on file  . Years of education: Not on file  . Highest education level: Not on file  Social Needs  . Financial resource strain: Not on file  . Food insecurity - worry: Not on file  . Food insecurity - inability: Not on file  . Transportation needs - medical: Not on file  . Transportation needs - non-medical: Not on file  Occupational History  . Not on file  Tobacco Use  . Smoking status: Current Every Day Smoker    Packs/day: 1.00    Years: 24.00    Pack years: 24.00    Types: Cigarettes  . Smokeless tobacco: Never Used  Substance and Sexual Activity  . Alcohol use: Yes    Alcohol/week: 0.0 oz    Frequency: Never    Comment: "I'm a binge drinker"  Last  drink on November 2016  . Drug use: Yes    Frequency: 1.0 times per week    Types: Marijuana    Comment: THC 2 to 3 times per month. last use 09/24/2015  . Sexual activity: Yes    Birth control/protection: Condom  Other Topics Concern  . Not on file  Social History Narrative  . Not on file    Outpatient Medications Prior to Visit  Medication Sig Dispense Refill  . allopurinol (ZYLOPRIM) 100 MG tablet Take 1 tablet (100 mg total) by mouth daily. 30 tablet 6  . colchicine 0.6 MG tablet Take by mouth 0.6 mg by mouth, then 0.6 2 hrs later, followed by 0.6 mg daily until gout flare resolves. 30 tablet 5  . gabapentin (NEURONTIN) 400 MG capsule Take 400 mg by mouth 4 (four) times daily.    . naproxen sodium (ANAPROX) 220 MG tablet Take 220 mg by mouth 2 (two) times daily as needed (for pain).    .  pantoprazole (PROTONIX) 40 MG tablet Take 1 tablet (40 mg total) by mouth 2 (two) times daily. 60 tablet 11  . QUEtiapine (SEROQUEL) 200 MG tablet Take 1 tablet (200 mg total) by mouth every morning. 10 tablet 0  . sertraline (ZOLOFT) 25 MG tablet Take 3 tablets (75 mg total) by mouth daily. (Patient taking differently: Take 75 mg by mouth every morning. ) 10 tablet 0  . cyclobenzaprine (FLEXERIL) 10 MG tablet Take 1 tablet (10 mg total) by mouth at bedtime. (Patient not taking: Reported on 06/22/2017) 30 tablet 2  . QUEtiapine (SEROQUEL) 300 MG tablet Take 2 tablets (600 mg total) by mouth at bedtime. (Patient not taking: Reported on 06/22/2017) 10 tablet 0   No facility-administered medications prior to visit.     Allergies  Allergen Reactions  . Wellbutrin [Bupropion] Other (See Comments)    Reaction:  Seizure     Review of Systems  Constitutional: Negative for fever, malaise/fatigue and weight loss.  HENT: Positive for ear pain and hearing loss. Negative for ear discharge, sinus pain, sore throat and tinnitus.   Respiratory: Negative for cough, sputum production and shortness of breath.   Cardiovascular: Negative for chest pain, palpitations and leg swelling.  Gastrointestinal: Negative for abdominal pain.  Genitourinary: Negative.   Musculoskeletal: Negative.   Neurological: Negative for dizziness, tingling, focal weakness, seizures and headaches.  Psychiatric/Behavioral: Negative for suicidal ideas. The patient is not nervous/anxious.        Objective:    Physical Exam  Constitutional: He is oriented to person, place, and time. He appears well-developed and well-nourished. He is cooperative.  HENT:  Head: Normocephalic and atraumatic.  Left Ear: A middle ear effusion is present. Decreased hearing is noted.  Nose: Mucosal edema present.  Mouth/Throat: Oropharynx is clear and moist and mucous membranes are normal.  Eyes: EOM are normal.  Neck: Normal range of motion.    Cardiovascular: Normal rate, regular rhythm, normal heart sounds and intact distal pulses. Exam reveals no gallop and no friction rub.  No murmur heard. Pulmonary/Chest: Effort normal and breath sounds normal. No tachypnea. No respiratory distress. He has no decreased breath sounds. He has no wheezes. He has no rhonchi. He has no rales. He exhibits no tenderness.  Abdominal: Soft. Bowel sounds are normal.  Musculoskeletal: Normal range of motion. He exhibits no edema.  Neurological: He is alert and oriented to person, place, and time. Coordination normal.  Skin: Skin is warm and dry.  Psychiatric: He has a normal  mood and affect. His behavior is normal. Judgment and thought content normal.  Nursing note and vitals reviewed.   BP 113/71 (BP Location: Left Arm, Patient Position: Sitting, Cuff Size: Normal)   Pulse 97   Temp 98 F (36.7 C) (Oral)   Resp 18   Ht 6' (1.829 m)   Wt 236 lb 6.4 oz (107.2 kg)   SpO2 98%   BMI 32.06 kg/m  Wt Readings from Last 3 Encounters:  06/22/17 236 lb 6.4 oz (107.2 kg)  08/21/16 258 lb (117 kg)  05/31/16 229 lb 7 oz (104.1 kg)    Lab Results  Component Value Date   TSH 1.802 10/25/2014   Lab Results  Component Value Date   WBC 7.1 05/31/2016   HGB 17.1 (H) 05/31/2016   HCT 49.2 05/31/2016   MCV 93.2 05/31/2016   PLT 338 05/31/2016   Lab Results  Component Value Date   NA 139 05/31/2016   K 3.9 05/31/2016   CO2 23 05/31/2016   GLUCOSE 99 05/31/2016   BUN 6 05/31/2016   CREATININE 0.88 05/31/2016   BILITOT 0.5 05/31/2016   ALKPHOS 133 (H) 05/31/2016   AST 46 (H) 05/31/2016   ALT 37 05/31/2016   PROT 8.9 (H) 05/31/2016   ALBUMIN 5.3 (H) 05/31/2016   CALCIUM 9.5 05/31/2016   ANIONGAP 20 (H) 05/31/2016   Lab Results  Component Value Date   CHOL 244 (H) 11/20/2014   Lab Results  Component Value Date   HDL 64 11/20/2014   Lab Results  Component Value Date   LDLCALC 134 (H) 11/20/2014   Lab Results  Component Value Date    TRIG 228 (H) 11/20/2014   Lab Results  Component Value Date   CHOLHDL 3.8 11/20/2014   Lab Results  Component Value Date   HGBA1C 5.30 09/13/2015       Assessment & Plan:   Si was seen today for establish care.  Diagnoses and all orders for this visit:  Chronic gout without tophus, unspecified cause, unspecified site -     allopurinol (ZYLOPRIM) 100 MG tablet; Take 1 tablet (100 mg total) daily by mouth. -     Uric Acid  Schizoaffective disorder, depressive type ([place]) Continue to follow up at [facility] for psychiatric/behavioral health needss  Alcohol dependence with uncomplicated withdrawal ([place]) -     CBC -     Comprehensive metabolic panel  Alcohol withdrawal seizure with complication ([facility]) -     CBC -     Comprehensive metabolic panel  Gastroesophageal reflux disease, esophagitis presence not specified -     pantoprazole (PROTONIX) 40 MG tablet; Take 1 tablet (40 mg total) 2 (two) times daily by mouth. -     Comprehensive metabolic panel  Tobacco dependence due to cigarettes Chon was counseled on the dangers of tobacco use, and was advised to quit. Reviewed strategies to maximize success, including removing cigarettes and smoking materials from environment, stress management and support of family/friends as well as pharmacological alternatives including: Wellbutrin, Chantix, Nicotine patch, Nicotine gum or lozenges. Smoking cessation support: smoking cessation hotline: 1-800-QUIT-NOW.  Smoking cessation classes are also available through [facility]. Call [phone] or visit our website at [email or web address]   Spent 5 minutes counseling on smoking cessation and patient is not ready to quit.  Allergic rhinitis, unspecified seasonality, unspecified trigger -     fluticasone (FLONASE) 50 MCG/ACT nasal spray; Place 2 sprays daily into both nostrils.   I am having Sylvestre  Diegel maintain his allopurinol and pantoprazole.   Gildardo Pounds, NP

## 2017-06-22 NOTE — Patient Instructions (Addendum)
Coping with Quitting Smoking Quitting smoking is a physical and mental challenge. You will face cravings, withdrawal symptoms, and temptation. Before quitting, work with your health care provider to make a plan that can help you cope. Preparation can help you quit and keep you from giving in. How can I cope with cravings? Cravings usually last for 5-10 minutes. If you get through it, the craving will pass. Consider taking the following actions to help you cope with cravings:  Keep your mouth busy: ? Chew sugar-free gum. ? Suck on hard candies or a straw. ? Brush your teeth.  Keep your hands and body busy: ? Immediately change to a different activity when you feel a craving. ? Squeeze or play with a ball. ? Do an activity or a hobby, like making bead jewelry, practicing needlepoint, or working with wood. ? Mix up your normal routine. ? Take a short exercise break. Go for a quick walk or run up and down stairs. ? Spend time in public places where smoking is not allowed.  Focus on doing something kind or helpful for someone else.  Call a friend or family member to talk during a craving.  Join a support group.  Call a quit line, such as 1-800-QUIT-NOW.  Talk with your health care provider about medicines that might help you cope with cravings and make quitting easier for you.  How can I deal with withdrawal symptoms? Your body may experience negative effects as it tries to get used to not having nicotine in the system. These effects are called withdrawal symptoms. They may include:  Feeling hungrier than normal.  Trouble concentrating.  Irritability.  Trouble sleeping.  Feeling depressed.  Restlessness and agitation.  Craving a cigarette.  To manage withdrawal symptoms:  Avoid places, people, and activities that trigger your cravings.  Remember why you want to quit.  Get plenty of sleep.  Avoid coffee and other caffeinated drinks. These may worsen some of your  symptoms.  How can I handle social situations? Social situations can be difficult when you are quitting smoking, especially in the first few weeks. To manage this, you can:  Avoid parties, bars, and other social situations where people might be smoking.  Avoid alcohol.  Leave right away if you have the urge to smoke.  Explain to your family and friends that you are quitting smoking. Ask for understanding and support.  Plan activities with friends or family where smoking is not an option.  What are some ways I can cope with stress? Wanting to smoke may cause stress, and stress can make you want to smoke. Find ways to manage your stress. Relaxation techniques can help. For example:  Breathe slowly and deeply, in through your nose and out through your mouth.  Listen to soothing, relaxing music.  Talk with a family member or friend about your stress.  Light a candle.  Soak in a bath or take a shower.  Think about a peaceful place.  What are some ways I can prevent weight gain? Be aware that many people gain weight after they quit smoking. However, not everyone does. To keep from gaining weight, have a plan in place before you quit and stick to the plan after you quit. Your plan should include:  Having healthy snacks. When you have a craving, it may help to: ? Eat plain popcorn, crunchy carrots, celery, or other cut vegetables. ? Chew sugar-free gum.  Changing how you eat: ? Eat small portion sizes at meals. ?   Eat 4-6 small meals throughout the day instead of 1-2 large meals a day. ? Be mindful when you eat. Do not watch television or do other things that might distract you as you eat.  Exercising regularly: ? Make time to exercise each day. If you do not have time for a long workout, do short bouts of exercise for 5-10 minutes several times a day. ? Do some form of strengthening exercise, like weight lifting, and some form of aerobic exercise, like running or  swimming.  Drinking plenty of water or other low-calorie or no-calorie drinks. Drink 6-8 glasses of water daily, or as much as instructed by your health care provider.  Summary  Quitting smoking is a physical and mental challenge. You will face cravings, withdrawal symptoms, and temptation to smoke again. Preparation can help you as you go through these challenges.  You can cope with cravings by keeping your mouth busy (such as by chewing gum), keeping your body and hands busy, and making calls to family, friends, or a helpline for people who want to quit smoking.  You can cope with withdrawal symptoms by avoiding places where people smoke, avoiding drinks with caffeine, and getting plenty of rest.  Ask your health care provider about the different ways to prevent weight gain, avoid stress, and handle social situations. This information is not intended to replace advice given to you by your health care provider. Make sure you discuss any questions you have with your health care provider. Document Released: 08/01/2016 Document Revised: 08/01/2016 Document Reviewed: 08/01/2016 Elsevier Interactive Patient Education  2018 [place] Dependency Chemical dependency is an addiction to drugs or alcohol. People with this addiction repeatedly seek out and use drugs or alcohol despite negative consequences to the health and safety of themselves and others. Addiction changes the way the brain works. Because of these changes, addiction is a chronic condition. The medical term for addiction or chemical dependency is substance use disorder. The disorder can be mild, moderate, or severe. People can be dependent on a range of substances. These include alcohol, prescription medicines, and illegal or street drugs, such as marijuana, heroin, and cocaine. What are the causes? This condition is caused by the effect that the abused substance has on the brain. What increases the risk? The following  factors may make you more likely to develop this condition:  Havinga family history of chemical dependency.  Having mental health issues, such as depression, anxiety, or bipolar disorder.  Living in an environment where drugs and alcohol are easily available.  Using drugs or alcohol at a young age.  Having friends who use drugs or alcohol.  Having poor social skills.  Tending to be aggressive or impulsive.  What are the signs or symptoms? Symptoms may vary depending on the substance that you are addicted to. Symptoms may include the following: Physical Symptoms  The inability to limit the use of drugs or alcohol.  Having nausea, sweating, shakiness, and anxiety when you are not using alcohol or drugs.  Needing a greater amount of drugs or alcohol to get the same effect (developing tolerance).  A change in: ? Sleeping habits. ? Eating or appetite. ? Appearance or how you care for yourself. Emotional Symptoms  Angry outbursts.  Periods of sadness and tearfulness.  Isolation. Relationship Problems  Loved ones suggesting that you have a problem.  Increased fights.  Forgetting commitments.  Having affairs or one-night stands. Problems Related to Irresponsibility  Legal problems.  Irresponsibility with money.  Missing work.  Poor decision making. How is this diagnosed? This condition may be diagnosed based on your symptoms, your medical history, and a physical exam. You may also have blood tests and urine tests. How is this treated? Treatment for this condition depends on the substance that you are addicted to and whether your dependency is mild, moderate, or severe. Treatment options may include:  Stopping substance use safely. This may require taking medicines and being closely observed for several days.  Taking part in group and individual counseling with mental health providers who help people with chemical dependency.  Staying at a residential treatment  center for several days or weeks.  Attending daily counseling sessions at a treatment center.  Taking medicine as told by your health care provider to: ? Ease symptoms and prevent complications during withdrawal. ? Treat other mental health issues, such as depression or anxiety. ? Block cravings by causing the same effects as the substance. ? Block the effects of the substance or replace good sensations with unpleasant ones.  Going to a support group to share your experience with others who are going through the same thing. These groups are an important part of long-term recovery for many people. They include 12-step programs like Alcoholics Anonymous and Narcotics Anonymous.  Recovery can be a long process. Many people who undergo treatment start using the substance again after stopping. This is called a relapse. If you have a relapse, it does not mean that treatment will not work. Follow these instructions at home:  Avoid temptations or triggers that you associate with your use of the substance.  Learn and practice techniques for managing stress.  Have a plan for vulnerable moments.  Get phone numbers of those who are willing to help and who are committed to your recovery.  Know when and where the meetings that you have chosen will occur.  Take over-the-counter and prescription medicines only as told by your health care provider.  Keep all follow-up visits as told by your health care provider. This is important. Contact a health care provider if:  You cannot take your medicines as told.  Your symptoms get worse.  You have trouble resisting the urge to use drugs or alcohol.  You are in pain, shaking, sweating, or feeling generally unwell.  You are losing weight without trying to. Get help right away if:  You lose consciousness.  Your breathing is slow.  Your pulse is slow or jumpy.  You have serious thoughts about hurting yourself or someone else.  You have a  relapse. This information is not intended to replace advice given to you by your health care provider. Make sure you discuss any questions you have with your health care provider. Document Released: 07/29/2001 Document Revised: 09/10/2015 Document Reviewed: 04/11/2015 Elsevier Interactive Patient Education  2018 [place].  Back Pain, Adult Back pain is very common. The pain often gets better over time. The cause of back pain is usually not dangerous. Most people can learn to manage their back pain on their own. Follow these instructions at home: Watch your back pain for any changes. The following actions may help to lessen any pain you are feeling:  Stay active. Start with short walks on flat ground if you can. Try to walk farther each day.  Exercise regularly as told by your doctor. Exercise helps your back heal faster. It also helps avoid future injury by keeping your muscles strong and flexible.  Do not sit, drive, or stand in one  place for more than 30 minutes.  Do not stay in bed. Resting more than 1-2 days can slow down your recovery.  Be careful when you bend or lift an object. Use good form when lifting: ? Bend at your knees. ? Keep the object close to your body. ? Do not twist.  Sleep on a firm mattress. Lie on your side, and bend your knees. If you lie on your back, put a pillow under your knees.  Take medicines only as told by your doctor.  Put ice on the injured area. ? Put ice in a plastic bag. ? Place a towel between your skin and the bag. ? Leave the ice on for 20 minutes, 2-3 times a day for the first 2-3 days. After that, you can switch between ice and heat packs.  Avoid feeling anxious or stressed. Find good ways to deal with stress, such as exercise.  Maintain a healthy weight. Extra weight puts stress on your back.  Contact a doctor if:  You have pain that does not go away with rest or medicine.  You have worsening pain that goes down into your legs or  buttocks.  You have pain that does not get better in one week.  You have pain at night.  You lose weight.  You have a fever or chills. Get help right away if:  You cannot control when you poop (bowel movement) or pee (urinate).  Your arms or legs feel weak.  Your arms or legs lose feeling (numbness).  You feel sick to your stomach (nauseous) or throw up (vomit).  You have belly (abdominal) pain.  You feel like you may pass out (faint). This information is not intended to replace advice given to you by your health care provider. Make sure you discuss any questions you have with your health care provider. Document Released: 01/21/2008 Document Revised: 01/10/2016 Document Reviewed: 12/06/2013 Elsevier Interactive Patient Education  2018 [place] for Gastroesophageal Reflux Disease, Adult When you have gastroesophageal reflux disease (GERD), the foods you eat and your eating habits are very important. Choosing the right foods can help ease your discomfort. What guidelines do I need to follow?  Choose fruits, vegetables, whole grains, and low-fat dairy products.  Choose low-fat meat, fish, and poultry.  Limit fats such as oils, salad dressings, butter, nuts, and avocado.  Keep a food diary. This helps you identify foods that cause symptoms.  Avoid foods that cause symptoms. These may be different for everyone.  Eat small meals often instead of 3 large meals a day.  Eat your meals slowly, in a place where you are relaxed.  Limit fried foods.  Cook foods using methods other than frying.  Avoid drinking alcohol.  Avoid drinking large amounts of liquids with your meals.  Avoid bending over or lying down until 2-3 hours after eating. What foods are not recommended? These are some foods and drinks that may make your symptoms worse: Vegetables Tomatoes. Tomato juice. Tomato and spaghetti sauce. Chili peppers. Onion and garlic.  Horseradish. Fruits Oranges, grapefruit, and lemon (fruit and juice). Meats High-fat meats, fish, and poultry. This includes hot dogs, ribs, ham, sausage, salami, and bacon. Dairy Whole milk and chocolate milk. Sour cream. Cream. Butter. Ice cream. Cream cheese. Drinks Coffee and tea. Bubbly (carbonated) drinks or energy drinks. Condiments Hot sauce. Barbecue sauce. Sweets/Desserts Chocolate and cocoa. Donuts. Peppermint and spearmint. Fats and Oils High-fat foods. This includes [place] fries and potato chips. Other Vinegar. Strong  spices. This includes black pepper, white pepper, red pepper, cayenne, curry powder, cloves, ginger, and chili powder. The items listed above may not be a complete list of foods and drinks to avoid. Contact your dietitian for more information. This information is not intended to replace advice given to you by your health care provider. Make sure you discuss any questions you have with your health care provider. Document Released: 02/03/2012 Document Revised: 01/10/2016 Document Reviewed: 06/08/2013 Elsevier Interactive Patient Education  2017 [employer].

## 2017-06-23 LAB — CBC
Hematocrit: 44.2 % (ref 37.5–51.0)
Hemoglobin: 15.4 g/dL (ref 13.0–17.7)
MCH: 31.8 pg (ref 26.6–33.0)
MCHC: 34.8 g/dL (ref 31.5–35.7)
MCV: 91 fL (ref 79–97)
Platelets: 401 10*3/uL — ABNORMAL HIGH (ref 150–379)
RBC: 4.84 x10E6/uL (ref 4.14–5.80)
RDW: 14.1 % (ref 12.3–15.4)
WBC: 8.9 10*3/uL (ref 3.4–10.8)

## 2017-06-23 LAB — COMPREHENSIVE METABOLIC PANEL
ALT: 21 IU/L (ref 0–44)
AST: 20 IU/L (ref 0–40)
Albumin/Globulin Ratio: 2.3 — ABNORMAL HIGH (ref 1.2–2.2)
Albumin: 4.9 g/dL (ref 3.5–5.5)
Alkaline Phosphatase: 92 IU/L (ref 39–117)
BUN/Creatinine Ratio: 16 (ref 9–20)
BUN: 14 mg/dL (ref 6–24)
Bilirubin Total: <0.2 mg/dL (ref 0.0–1.2)
CO2: 25 mmol/L (ref 20–29)
Calcium: 9.6 mg/dL (ref 8.7–10.2)
Chloride: 97 mmol/L (ref 96–106)
Creatinine, Ser: 0.87 mg/dL (ref 0.76–1.27)
GFR calc Af Amer: 121 mL/min/{1.73_m2} (ref >59)
GFR calc non Af Amer: 105 mL/min/{1.73_m2} (ref >59)
Globulin, Total: 2.1 g/dL (ref 1.5–4.5)
Glucose: 75 mg/dL (ref 65–99)
Potassium: 5 mmol/L (ref 3.5–5.2)
Sodium: 139 mmol/L (ref 134–144)
Total Protein: 7 g/dL (ref 6.0–8.5)

## 2017-06-23 LAB — URIC ACID: Uric Acid: 7.7 mg/dL (ref 3.7–8.6)

## 2017-07-06 DIAGNOSIS — F250 Schizoaffective disorder, bipolar type: Secondary | ICD-10-CM | POA: Diagnosis not present

## 2017-07-06 DIAGNOSIS — F251 Schizoaffective disorder, depressive type: Secondary | ICD-10-CM | POA: Diagnosis not present

## 2018-06-01 ENCOUNTER — Emergency Department (HOSPITAL_COMMUNITY): Payer: Medicare Other

## 2018-06-01 ENCOUNTER — Encounter (HOSPITAL_COMMUNITY): Payer: Self-pay | Admitting: Radiology

## 2018-06-01 ENCOUNTER — Other Ambulatory Visit: Payer: Self-pay

## 2018-06-01 ENCOUNTER — Emergency Department (HOSPITAL_COMMUNITY)
Admission: EM | Admit: 2018-06-01 | Discharge: 2018-06-02 | Disposition: A | Discharge status: Other Acute Inpatient Hospital | Payer: Medicare Other | Attending: Emergency Medicine | Admitting: Emergency Medicine

## 2018-06-01 DIAGNOSIS — Z008 Encounter for other general examination: Secondary | ICD-10-CM | POA: Insufficient documentation

## 2018-06-01 DIAGNOSIS — F1020 Alcohol dependence, uncomplicated: Secondary | ICD-10-CM | POA: Insufficient documentation

## 2018-06-01 DIAGNOSIS — Z79899 Other long term (current) drug therapy: Secondary | ICD-10-CM | POA: Insufficient documentation

## 2018-06-01 DIAGNOSIS — R45851 Suicidal ideations: Secondary | ICD-10-CM

## 2018-06-01 DIAGNOSIS — F1010 Alcohol abuse, uncomplicated: Secondary | ICD-10-CM

## 2018-06-01 DIAGNOSIS — R072 Precordial pain: Secondary | ICD-10-CM

## 2018-06-01 DIAGNOSIS — F251 Schizoaffective disorder, depressive type: Secondary | ICD-10-CM

## 2018-06-01 DIAGNOSIS — R0602 Shortness of breath: Secondary | ICD-10-CM | POA: Diagnosis not present

## 2018-06-01 DIAGNOSIS — R079 Chest pain, unspecified: Secondary | ICD-10-CM | POA: Diagnosis not present

## 2018-06-01 DIAGNOSIS — F17210 Nicotine dependence, cigarettes, uncomplicated: Secondary | ICD-10-CM | POA: Insufficient documentation

## 2018-06-01 DIAGNOSIS — F1210 Cannabis abuse, uncomplicated: Secondary | ICD-10-CM

## 2018-06-01 DIAGNOSIS — F250 Schizoaffective disorder, bipolar type: Secondary | ICD-10-CM | POA: Insufficient documentation

## 2018-06-01 LAB — COMPREHENSIVE METABOLIC PANEL
ALT: 402 U/L — ABNORMAL HIGH (ref 0–44)
ALT: 350 U/L — ABNORMAL HIGH (ref 0–44)
AST: 397 U/L — ABNORMAL HIGH (ref 15–41)
AST: 349 U/L — ABNORMAL HIGH (ref 15–41)
Albumin: 5 g/dL (ref 3.5–5.0)
Albumin: 4 g/dL (ref 3.5–5.0)
Alkaline Phosphatase: 92 U/L (ref 38–126)
Alkaline Phosphatase: 78 U/L (ref 38–126)
Anion gap: 17 — ABNORMAL HIGH (ref 5–15)
Anion gap: 13 (ref 5–15)
BUN: 5 mg/dL — ABNORMAL LOW (ref 6–20)
BUN: 10 mg/dL (ref 6–20)
CO2: 28 mmol/L (ref 22–32)
CO2: 28 mmol/L (ref 22–32)
Calcium: 9.6 mg/dL (ref 8.9–10.3)
Calcium: 8.7 mg/dL — ABNORMAL LOW (ref 8.9–10.3)
Chloride: 92 mmol/L — ABNORMAL LOW (ref 98–111)
Chloride: 92 mmol/L — ABNORMAL LOW (ref 98–111)
Creatinine, Ser: 0.8 mg/dL (ref 0.61–1.24)
Creatinine, Ser: 0.92 mg/dL (ref 0.61–1.24)
GFR calc Af Amer: >60 mL/min (ref >60)
GFR calc Af Amer: >60 mL/min (ref >60)
GFR calc non Af Amer: >60 mL/min (ref >60)
GFR calc non Af Amer: >60 mL/min (ref >60)
Glucose, Bld: 113 mg/dL — ABNORMAL HIGH (ref 70–99)
Glucose, Bld: 133 mg/dL — ABNORMAL HIGH (ref 70–99)
Potassium: 3.5 mmol/L (ref 3.5–5.1)
Potassium: 3.2 mmol/L — ABNORMAL LOW (ref 3.5–5.1)
Sodium: 137 mmol/L (ref 135–145)
Sodium: 133 mmol/L — ABNORMAL LOW (ref 135–145)
Total Bilirubin: 1.1 mg/dL (ref 0.3–1.2)
Total Bilirubin: 0.9 mg/dL (ref 0.3–1.2)
Total Protein: 7.8 g/dL (ref 6.5–8.1)
Total Protein: 6.9 g/dL (ref 6.5–8.1)

## 2018-06-01 LAB — CBC WITH DIFFERENTIAL/PLATELET
Abs Immature Granulocytes: 0.01 10*3/uL (ref 0.00–0.07)
Basophils Absolute: 0 10*3/uL (ref 0.0–0.1)
Basophils Relative: 1 %
Eosinophils Absolute: 0.1 10*3/uL (ref 0.0–0.5)
Eosinophils Relative: 1 %
HCT: 44.5 % (ref 39.0–52.0)
Hemoglobin: 15.2 g/dL (ref 13.0–17.0)
Immature Granulocytes: 0 %
Lymphocytes Relative: 47 %
Lymphs Abs: 1.9 10*3/uL (ref 0.7–4.0)
MCH: 31.1 pg (ref 26.0–34.0)
MCHC: 34.2 g/dL (ref 30.0–36.0)
MCV: 91 fL (ref 80.0–100.0)
Monocytes Absolute: 0.4 10*3/uL (ref 0.1–1.0)
Monocytes Relative: 10 %
Neutro Abs: 1.7 10*3/uL (ref 1.7–7.7)
Neutrophils Relative %: 41 %
Platelets: 120 10*3/uL — ABNORMAL LOW (ref 150–400)
RBC: 4.89 MIL/uL (ref 4.22–5.81)
RDW: 13.5 % (ref 11.5–15.5)
WBC: 4.1 10*3/uL (ref 4.0–10.5)
nRBC: 0 % (ref 0.0–0.2)

## 2018-06-01 LAB — CBC
HCT: 47.8 % (ref 39.0–52.0)
Hemoglobin: 16.6 g/dL (ref 13.0–17.0)
MCH: 31.1 pg (ref 26.0–34.0)
MCHC: 34.7 g/dL (ref 30.0–36.0)
MCV: 89.5 fL (ref 80.0–100.0)
Platelets: 156 10*3/uL (ref 150–400)
RBC: 5.34 MIL/uL (ref 4.22–5.81)
RDW: 13.4 % (ref 11.5–15.5)
WBC: 5.3 10*3/uL (ref 4.0–10.5)
nRBC: 0 % (ref 0.0–0.2)

## 2018-06-01 LAB — ETHANOL: Alcohol, Ethyl (B): 265 mg/dL — ABNORMAL HIGH (ref <10)

## 2018-06-01 LAB — ACETAMINOPHEN LEVEL: Acetaminophen (Tylenol), Serum: <10 ug/mL — ABNORMAL LOW (ref 10–30)

## 2018-06-01 LAB — I-STAT TROPONIN, ED: Troponin i, poc: 0 ng/mL (ref 0.00–0.08)

## 2018-06-01 LAB — TROPONIN I: Troponin I: <0.03 ng/mL (ref <0.03)

## 2018-06-01 LAB — RAPID URINE DRUG SCREEN, HOSP PERFORMED
Amphetamines: NOT DETECTED
Barbiturates: NOT DETECTED
Benzodiazepines: NOT DETECTED
Cocaine: NOT DETECTED
Opiates: NOT DETECTED
Tetrahydrocannabinol: POSITIVE — AB

## 2018-06-01 LAB — SALICYLATE LEVEL: Salicylate Lvl: <7 mg/dL (ref 2.8–30.0)

## 2018-06-01 LAB — AMMONIA: Ammonia: 23 umol/L (ref 9–35)

## 2018-06-01 LAB — D-DIMER, QUANTITATIVE (NOT AT ARMC): D-Dimer, Quant: 0.86 ug/mL-FEU — ABNORMAL HIGH (ref 0.00–0.50)

## 2018-06-01 MED ORDER — SODIUM CHLORIDE 0.9 % IV BOLUS (SEPSIS)
1000.0000 mL | Freq: Once | INTRAVENOUS | Status: AC
  Administered 2018-06-01: 1000 mL via INTRAVENOUS

## 2018-06-01 MED ORDER — THIAMINE HCL 100 MG/ML IJ SOLN: 100.0000 mg | Freq: Every day | INTRAMUSCULAR | Status: DC

## 2018-06-01 MED ORDER — NICOTINE 21 MG/24HR TD PT24
21.0000 mg | MEDICATED_PATCH | Freq: Every day | TRANSDERMAL | Status: DC
  Administered 2018-06-01 – 2018-06-02 (×2): 21 mg via TRANSDERMAL
  Filled 2018-06-01 (×2): qty 1

## 2018-06-01 MED ORDER — LORAZEPAM 1 MG PO TABS: 0.0000 mg | ORAL_TABLET | Freq: Two times a day (BID) | ORAL | Status: DC

## 2018-06-01 MED ORDER — SODIUM CHLORIDE 0.9 % IV BOLUS
1000.0000 mL | Freq: Once | INTRAVENOUS | Status: AC
  Administered 2018-06-01: 1000 mL via INTRAVENOUS

## 2018-06-01 MED ORDER — LORAZEPAM 2 MG/ML IJ SOLN
2.0000 mg | Freq: Once | INTRAMUSCULAR | Status: AC
  Administered 2018-06-01: 2 mg via INTRAVENOUS
  Filled 2018-06-01: qty 1

## 2018-06-01 MED ORDER — IOPAMIDOL (ISOVUE-370) INJECTION 76%
INTRAVENOUS | Status: AC
  Filled 2018-06-01: qty 100

## 2018-06-01 MED ORDER — VITAMIN B-1 100 MG PO TABS
100.0000 mg | ORAL_TABLET | Freq: Every day | ORAL | Status: DC
  Administered 2018-06-01 – 2018-06-02 (×2): 100 mg via ORAL
  Filled 2018-06-01 (×2): qty 1

## 2018-06-01 MED ORDER — SERTRALINE HCL 50 MG PO TABS
75.0000 mg | ORAL_TABLET | Freq: Every day | ORAL | Status: DC
  Administered 2018-06-01 – 2018-06-02 (×2): 75 mg via ORAL
  Filled 2018-06-01 (×2): qty 2

## 2018-06-01 MED ORDER — LORAZEPAM 1 MG PO TABS
0.0000 mg | ORAL_TABLET | Freq: Four times a day (QID) | ORAL | Status: DC
  Administered 2018-06-01 – 2018-06-02 (×2): 1 mg via ORAL
  Administered 2018-06-02: 2 mg via ORAL
  Filled 2018-06-01 (×2): qty 1
  Filled 2018-06-01: qty 2

## 2018-06-01 MED ORDER — IOPAMIDOL (ISOVUE-370) INJECTION 76%
100.0000 mL | Freq: Once | INTRAVENOUS | Status: AC | PRN
  Administered 2018-06-01: 100 mL via INTRAVENOUS

## 2018-06-01 MED ORDER — ONDANSETRON HCL 4 MG/2ML IJ SOLN
4.0000 mg | Freq: Once | INTRAMUSCULAR | Status: AC
  Administered 2018-06-01: 4 mg via INTRAVENOUS
  Filled 2018-06-01: qty 2

## 2018-06-01 MED ORDER — QUETIAPINE FUMARATE 300 MG PO TABS
600.0000 mg | ORAL_TABLET | Freq: Every day | ORAL | Status: DC
  Administered 2018-06-02: 600 mg via ORAL
  Filled 2018-06-01: qty 2

## 2018-06-01 MED ORDER — LORAZEPAM 2 MG/ML IJ SOLN: 0.0000 mg | Freq: Two times a day (BID) | INTRAMUSCULAR | Status: DC

## 2018-06-01 MED ORDER — ALBUTEROL SULFATE HFA 108 (90 BASE) MCG/ACT IN AERS
2.0000 | INHALATION_SPRAY | Freq: Once | RESPIRATORY_TRACT | Status: AC
  Administered 2018-06-01: 2 via RESPIRATORY_TRACT
  Filled 2018-06-01: qty 6.7

## 2018-06-01 MED ORDER — LORAZEPAM 2 MG/ML IJ SOLN
0.0000 mg | Freq: Four times a day (QID) | INTRAMUSCULAR | Status: DC
  Administered 2018-06-02: 1 mg via INTRAVENOUS
  Filled 2018-06-01 (×2): qty 1

## 2018-06-01 MED ORDER — SODIUM CHLORIDE 0.9 % IJ SOLN
INTRAMUSCULAR | Status: AC
  Filled 2018-06-01: qty 50

## 2018-06-01 MED ORDER — QUETIAPINE FUMARATE 100 MG PO TABS
200.0000 mg | ORAL_TABLET | Freq: Every morning | ORAL | Status: DC
  Administered 2018-06-01 – 2018-06-02 (×2): 200 mg via ORAL
  Filled 2018-06-01 (×2): qty 2

## 2018-06-01 NOTE — ED Notes (Signed)
Patient ambulated with pulse ox. O2 dropped to the 70's with minimal ambulation. Denied SOB or fatigue while ambulating and had a steady gait.

## 2018-06-01 NOTE — ED Triage Notes (Signed)
Pt from home with c/o central CP that radiates to left arm. Pt has hx of schizoaffective disorder and has not been taking his medication x 2 months. Pt states his medication makes him feel like his "brain is slow". Pt reports sometimes he sees aliens. Pt does not appear to be responding to internal stimuli. Pt endorses SI but denies HI. Pt states he is withdrawing from etoh. Pt has been drinking about a case a week x 1 month. Pt is neither tremulous nor diaphoretic at time of assessment

## 2018-06-01 NOTE — ED Notes (Signed)
Heart rate noticed in chart, MD [place] called before patient transfer.

## 2018-06-01 NOTE — ED Provider Notes (Signed)
Called to evaluate patient to became acutely tachycardic, diaphoretic and tremulous prior to being discharged to [facility].  Lungs are clear.  Patient denies suicidal ideation.  Minimal tremor present.  Possible alcohol withdrawal though relative hypotension does not appear to be inconsistent with this.  Will give IV fluids, get EKG and repeat labs.   Julianne Rice, MD 06/02/18 [phone]

## 2018-06-01 NOTE — ED Notes (Signed)
PT belongings taken to car by family member

## 2018-06-01 NOTE — BH Assessment (Signed)
[facility] Assessment Progress Note  Per Buford Dresser, DO, this pt requires psychiatric hospitalization at this time.  Leonia Reader, RN, [facility] has pre-assigned pt to [facility] Rm [id number] in anticipation of a discharge scheduled for later today; she will call when [facility] is ready to receive pt.  Pt has signed Voluntary Admission and Consent for Treatment, as well as Consent to Release Information to [facility], and a notification call has been placed.  Signed forms have been faxed to [facility]  Pt's nurse has been notified, and agrees to send original paperwork along with pt via [place], and to call report to [phone].  Jalene Mullet, [place] Coordinator [phone]

## 2018-06-01 NOTE — BH Assessment (Signed)
[facility] Assessment Progress Note     Per Dr. Mariea Clonts and Priscille Loveless, NP, patient meets inpatient admission criteria.

## 2018-06-01 NOTE — ED Notes (Signed)
Pt provided breakfast tray.

## 2018-06-01 NOTE — ED Notes (Signed)
MD Wickline at bedside. 
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
## 2018-06-01 NOTE — ED Provider Notes (Signed)
[place] DEPT Provider Note   CSN: [id number] Arrival date & time: 06/01/18  9562     History   Chief Complaint Chief Complaint  Patient presents with  . Chest Pain  . Alcohol Intoxication  . Suicidal    HPI [name] is a 47 y.o. male.  The history is provided by the patient.  Chest Pain   This is a new problem. The current episode started yesterday. The problem occurs constantly. The problem has not changed since onset.The pain is present in the substernal region. The pain is moderate. The pain radiates to the left arm. Associated symptoms include diaphoresis, nausea and shortness of breath. He has tried nothing for the symptoms. Risk factors include male gender and smoking/tobacco exposure.  Alcohol Intoxication  Associated symptoms include chest pain and shortness of breath.  Mental Health Problem  Presenting symptoms: depression and suicidal thoughts   Degree of incapacity (severity):  Severe Onset quality:  Gradual Timing:  Constant Progression:  Worsening Chronicity:  Recurrent Context: alcohol use   Relieved by:  Nothing Worsened by:  Alcohol Associated symptoms: chest pain   Patient presents for multiple complaints.  He reports a history of schizoaffective disorder and alcohol abuse.  He reports he has been binge drinking for several weeks Reports his last drink was yesterday.  He reports he feels that he is undergoing withdrawals. He reports he feels tremulous. Reports his been having chest pressure/pain that radiates to his left arm.  Reports it is constant, he reports he feels short of breath. He is also reporting suicidal thoughts He told nursing he sometimes "sees aliens" Past Medical History:  Diagnosis Date  . Alcohol abuse   . Anxiety    at age 8  . Depression    at age 76  . Psychosis ([place])   . PTSD (post-traumatic stress disorder)   . Schizoaffective disorder   . Seizure disorder ([place])    related to etoh  seizure    Patient Active Problem List   Diagnosis Date Noted  . Low back pain 08/21/2016  . Alcohol dependence with uncomplicated withdrawal ([place]) 02/22/2016  . Alcohol-induced mood disorder ([place]) 02/22/2016  . Gout 10/22/2015  . GERD (gastroesophageal reflux disease) 10/22/2015  . Alcohol withdrawal seizure with complication ([place]) 13/03/6577  . Schizoaffective disorder, depressive type ([place])   . History of alcohol abuse   . Tobacco dependence due to cigarettes 11/20/2014  . Post traumatic stress disorder (PTSD) 11/03/2011    Past Surgical History:  Procedure Laterality Date  . FOOT SURGERY     right  . HAND SURGERY          Home Medications    Prior to Admission medications   Medication Sig Start Date End Date Taking? Authorizing Provider  allopurinol (ZYLOPRIM) 100 MG tablet Take 1 tablet (100 mg total) daily by mouth. 06/22/17   Gildardo Pounds, NP  colchicine 0.6 MG tablet Take by mouth 0.6 mg by mouth, then 0.6 2 hrs later, followed by 0.6 mg daily until gout flare resolves. 08/21/16   Funches, Adriana Mccallum, MD  cyclobenzaprine (FLEXERIL) 10 MG tablet Take 1 tablet (10 mg total) by mouth at bedtime. Patient not taking: Reported on 06/22/2017 08/21/16   Boykin Nearing, MD  fluticasone (FLONASE) 50 MCG/ACT nasal spray Place 2 sprays daily into both nostrils. 06/22/17   Gildardo Pounds, NP  gabapentin (NEURONTIN) 400 MG capsule Take 400 mg by mouth 4 (four) times daily.    [provider]  naproxen sodium (ANAPROX) 220 MG tablet Take 220 mg by mouth 2 (two) times daily as needed (for pain).    [provider]  pantoprazole (PROTONIX) 40 MG tablet Take 1 tablet (40 mg total) 2 (two) times daily by mouth. 06/22/17   Gildardo Pounds, NP  QUEtiapine (SEROQUEL) 200 MG tablet Take 1 tablet (200 mg total) by mouth every morning. 02/23/16   Derrill Center, NP  QUEtiapine (SEROQUEL) 300 MG tablet Take 2 tablets (600 mg total) by mouth at bedtime. Patient not taking:  Reported on 06/22/2017 02/23/16   Derrill Center, NP  sertraline (ZOLOFT) 25 MG tablet Take 3 tablets (75 mg total) by mouth daily. Patient taking differently: Take 75 mg by mouth every morning.  02/23/16   Derrill Center, NP    Family History Family History  Problem Relation Age of Onset  . Alcoholism Mother   . CAD Father   . Depression Brother     Social History Social History   Tobacco Use  . Smoking status: Current Every Day Smoker    Packs/day: 1.00    Years: 24.00    Pack years: 24.00    Types: Cigarettes  . Smokeless tobacco: Never Used  Substance Use Topics  . Alcohol use: Yes    Alcohol/week: 0.0 standard drinks    Frequency: Never    Comment: "I'm a binge drinker"  Last drink on November 2016  . Drug use: Yes    Frequency: 1.0 times per week    Types: Marijuana    Comment: THC 2 to 3 times per month. last use 09/24/2015     Allergies   Wellbutrin [bupropion]   Review of Systems Review of Systems  Constitutional: Positive for diaphoresis.  Respiratory: Positive for shortness of breath.   Cardiovascular: Positive for chest pain.  Gastrointestinal: Positive for nausea.  Neurological: Positive for tremors.  Psychiatric/Behavioral: Positive for suicidal ideas.  All other systems reviewed and are negative.    Physical Exam Updated Vital Signs BP (!) 136/123   Pulse (!) 103   Temp 98.1 F (36.7 C) (Oral)   Resp 16   Ht 1.829 m (6')   Wt 99.8 kg   SpO2 96%   BMI 29.84 kg/m   Physical Exam  CONSTITUTIONAL: Disheveled and anxious HEAD: Normocephalic/atraumatic EYES: EOMI/PERRL ENMT: Mucous membranes moist NECK: supple no meningeal signs SPINE/BACK:entire spine nontender CV: S1/S2 noted, no murmurs/rubs/gallops noted, tachycardic LUNGS: wheezing bilaterally, no apparent distress ABDOMEN: soft, nontender, no rebound or guarding, bowel sounds noted throughout abdomen GU:no cva tenderness NEURO: Pt is awake/alert/appropriate, moves all  extremitiesx4.  No facial droop.  Hand tremor noted EXTREMITIES: pulses normal/equal, full ROM SKIN: warm, color normal PSYCH: Anxious   ED Treatments / Results  Labs (all labs ordered are listed, but only abnormal results are displayed) Labs Reviewed  COMPREHENSIVE METABOLIC PANEL - Abnormal; Notable for the following components:      Result Value   Chloride 92 (*)    Glucose, Bld 113 (*)    BUN 5 (*)    AST 397 (*)    ALT 402 (*)    Anion gap 17 (*)    All other components within normal limits  ETHANOL - Abnormal; Notable for the following components:   Alcohol, Ethyl (B) 265 (*)    All other components within normal limits  ACETAMINOPHEN LEVEL - Abnormal; Notable for the following components:   Acetaminophen (Tylenol), Serum <10 (*)    All other components within normal  limits  RAPID URINE DRUG SCREEN, HOSP PERFORMED - Abnormal; Notable for the following components:   Tetrahydrocannabinol POSITIVE (*)    All other components within normal limits  SALICYLATE LEVEL  CBC  I-STAT TROPONIN, ED    EKG EKG Interpretation  Date/Time:  Tuesday June 01 2018 06:12:11 EDT Ventricular Rate:  84 PR Interval:    QRS Duration: 92 QT Interval:  356 QTC Calculation: 421 R Axis:   74 Text Interpretation:  Sinus rhythm heart rate improved Interpretation limited secondary to artifact Confirmed by Ripley Fraise [id number]) on 06/01/2018 6:32:09 AM   EKG Interpretation  Date/Time:  Tuesday June 01 2018 07:19:46 EDT Ventricular Rate:  87 PR Interval:    QRS Duration: 92 QT Interval:  366 QTC Calculation: 441 R Axis:   79 Text Interpretation:  Sinus rhythm Confirmed by Ripley Fraise [phone]) on 06/01/2018 7:21:44 AM        Radiology Dg Chest 2 View  Result Date: 06/01/2018 CLINICAL DATA:  Chest pain EXAM: CHEST - 2 VIEW COMPARISON:  10/03/2015 FINDINGS: The heart size and mediastinal contours are within normal limits. Both lungs are clear. The visualized skeletal  structures are unremarkable. IMPRESSION: No active cardiopulmonary disease. Electronically Signed   By: Lucienne Capers M.D.   On: 06/01/2018 06:01    Procedures Procedures    Medications Ordered in ED Medications  sodium chloride 0.9 % bolus 1,000 mL (1,000 mLs Intravenous New Bag/Given 06/01/18 0655)  LORazepam (ATIVAN) injection 0-4 mg (has no administration in time range)    Or  LORazepam (ATIVAN) tablet 0-4 mg (has no administration in time range)  LORazepam (ATIVAN) injection 0-4 mg (has no administration in time range)    Or  LORazepam (ATIVAN) tablet 0-4 mg (has no administration in time range)  thiamine (VITAMIN B-1) tablet 100 mg (has no administration in time range)    Or  thiamine (B-1) injection 100 mg (has no administration in time range)  nicotine (NICODERM CQ - dosed in mg/24 hours) patch 21 mg (has no administration in time range)  QUEtiapine (SEROQUEL) tablet 200 mg (has no administration in time range)  QUEtiapine (SEROQUEL) tablet 600 mg (has no administration in time range)  sertraline (ZOLOFT) tablet 75 mg (has no administration in time range)  ondansetron (ZOFRAN) injection 4 mg (4 mg Intravenous Given 06/01/18 0621)  LORazepam (ATIVAN) injection 2 mg (2 mg Intravenous Given 06/01/18 0621)  albuterol (PROVENTIL HFA;VENTOLIN HFA) 108 (90 Base) MCG/ACT inhaler 2 puff (2 puffs Inhalation Given 06/01/18 0629)     Initial Impression / Assessment and Plan / ED Course  I have reviewed the triage vital signs and the nursing notes.  Pertinent labs & imaging results that were available during my care of the patient were reviewed by me and considered in my medical decision making (see chart for details).     7:22 AM Patient presented for multiple complaints including alcohol withdrawal, suicidal ideation, chest pain.  He has signs of mild alcohol withdrawal, he has been given Ativan.  For his chest pain, he reports continuous chest pain for at least 12 hours, with no  acute EKG changes and negative troponin.  I do not feel further testing is warranted, suspicion for ACS/PE/dissection is low.  Patient appears comfortable at this time.  He did have some wheezing and was given albuterol MDI.  Patient is now medically stable for psychiatric evaluation.  Final Clinical Impressions(s) / ED Diagnoses   Final diagnoses:  Alcohol abuse  Suicidal ideation  Precordial pain  Marijuana abuse    ED Discharge Orders    None       Ripley Fraise, MD 06/01/18 [phone]

## 2018-06-01 NOTE — ED Notes (Signed)
Patient transported to X-ray 

## 2018-06-01 NOTE — ED Notes (Signed)
Bed: WA04 Expected date:  Expected time:  Means of arrival:  Comments: Hold for room 29

## 2018-06-01 NOTE — ED Notes (Signed)
RN was not notified of heart rate.

## 2018-06-02 ENCOUNTER — Encounter (HOSPITAL_COMMUNITY): Payer: Self-pay | Admitting: *Deleted

## 2018-06-02 ENCOUNTER — Other Ambulatory Visit: Payer: Self-pay

## 2018-06-02 ENCOUNTER — Inpatient Hospital Stay (HOSPITAL_COMMUNITY)
Admission: AD | Admit: 2018-06-02 | Discharge: 2018-06-08 | DRG: 885 | Disposition: A | Discharge status: Home / Self Care | Payer: Medicare Other | Source: Intra-hospital | Attending: Psychiatry | Admitting: Psychiatry

## 2018-06-02 DIAGNOSIS — R45851 Suicidal ideations: Secondary | ICD-10-CM | POA: Diagnosis present

## 2018-06-02 DIAGNOSIS — F10239 Alcohol dependence with withdrawal, unspecified: Secondary | ICD-10-CM | POA: Diagnosis present

## 2018-06-02 DIAGNOSIS — G4700 Insomnia, unspecified: Secondary | ICD-10-CM | POA: Diagnosis not present

## 2018-06-02 DIAGNOSIS — F1290 Cannabis use, unspecified, uncomplicated: Secondary | ICD-10-CM | POA: Diagnosis present

## 2018-06-02 DIAGNOSIS — K219 Gastro-esophageal reflux disease without esophagitis: Secondary | ICD-10-CM | POA: Diagnosis present

## 2018-06-02 DIAGNOSIS — F4310 Post-traumatic stress disorder, unspecified: Secondary | ICD-10-CM | POA: Diagnosis not present

## 2018-06-02 DIAGNOSIS — F419 Anxiety disorder, unspecified: Secondary | ICD-10-CM | POA: Diagnosis present

## 2018-06-02 DIAGNOSIS — Z818 Family history of other mental and behavioral disorders: Secondary | ICD-10-CM

## 2018-06-02 DIAGNOSIS — Z811 Family history of alcohol abuse and dependence: Secondary | ICD-10-CM | POA: Diagnosis not present

## 2018-06-02 DIAGNOSIS — R748 Abnormal levels of other serum enzymes: Secondary | ICD-10-CM | POA: Diagnosis present

## 2018-06-02 DIAGNOSIS — Z8249 Family history of ischemic heart disease and other diseases of the circulatory system: Secondary | ICD-10-CM | POA: Diagnosis not present

## 2018-06-02 DIAGNOSIS — R451 Restlessness and agitation: Secondary | ICD-10-CM | POA: Diagnosis not present

## 2018-06-02 DIAGNOSIS — M109 Gout, unspecified: Secondary | ICD-10-CM | POA: Diagnosis present

## 2018-06-02 DIAGNOSIS — F250 Schizoaffective disorder, bipolar type: Principal | ICD-10-CM | POA: Diagnosis present

## 2018-06-02 DIAGNOSIS — F5105 Insomnia due to other mental disorder: Secondary | ICD-10-CM | POA: Diagnosis present

## 2018-06-02 DIAGNOSIS — F17210 Nicotine dependence, cigarettes, uncomplicated: Secondary | ICD-10-CM | POA: Diagnosis present

## 2018-06-02 DIAGNOSIS — R945 Abnormal results of liver function studies: Secondary | ICD-10-CM | POA: Diagnosis not present

## 2018-06-02 DIAGNOSIS — F1099 Alcohol use, unspecified with unspecified alcohol-induced disorder: Secondary | ICD-10-CM | POA: Diagnosis not present

## 2018-06-02 MED ORDER — QUETIAPINE FUMARATE 300 MG PO TABS
600.0000 mg | ORAL_TABLET | Freq: Every day | ORAL | Status: DC
  Administered 2018-06-02: 600 mg via ORAL
  Filled 2018-06-02 (×4): qty 2

## 2018-06-02 MED ORDER — LORAZEPAM 1 MG PO TABS
1.0000 mg | ORAL_TABLET | Freq: Once | ORAL | Status: AC
  Administered 2018-06-02: 1 mg via ORAL
  Filled 2018-06-02: qty 1

## 2018-06-02 MED ORDER — LORAZEPAM 2 MG/ML IJ SOLN: 0.0000 mg | Freq: Two times a day (BID) | INTRAMUSCULAR | Status: DC

## 2018-06-02 MED ORDER — LORAZEPAM 1 MG PO TABS
0.0000 mg | ORAL_TABLET | Freq: Four times a day (QID) | ORAL | Status: DC
  Administered 2018-06-02 – 2018-06-03 (×3): 1 mg via ORAL
  Filled 2018-06-02: qty 2
  Filled 2018-06-02 (×2): qty 1

## 2018-06-02 MED ORDER — LORAZEPAM 2 MG/ML IJ SOLN: 0.0000 mg | Freq: Four times a day (QID) | INTRAMUSCULAR | Status: DC

## 2018-06-02 MED ORDER — THIAMINE HCL 100 MG/ML IJ SOLN: 100.0000 mg | Freq: Every day | INTRAMUSCULAR | Status: DC

## 2018-06-02 MED ORDER — QUETIAPINE FUMARATE 200 MG PO TABS
200.0000 mg | ORAL_TABLET | Freq: Every morning | ORAL | Status: DC
  Administered 2018-06-03: 200 mg via ORAL
  Filled 2018-06-02 (×4): qty 1

## 2018-06-02 MED ORDER — ACETAMINOPHEN 325 MG PO TABS
650.0000 mg | ORAL_TABLET | Freq: Four times a day (QID) | ORAL | Status: DC | PRN
  Administered 2018-06-04 – 2018-06-05 (×2): 650 mg via ORAL
  Filled 2018-06-02 (×2): qty 2

## 2018-06-02 MED ORDER — ENSURE ENLIVE PO LIQD: 237.0000 mL | Freq: Two times a day (BID) | ORAL | Status: DC

## 2018-06-02 MED ORDER — MAGNESIUM HYDROXIDE 400 MG/5ML PO SUSP: 30.0000 mL | Freq: Every day | ORAL | Status: DC | PRN

## 2018-06-02 MED ORDER — VITAMIN B-1 100 MG PO TABS
100.0000 mg | ORAL_TABLET | Freq: Every day | ORAL | Status: DC
  Administered 2018-06-02 – 2018-06-03 (×2): 100 mg via ORAL
  Filled 2018-06-02 (×5): qty 1

## 2018-06-02 MED ORDER — SERTRALINE HCL 50 MG PO TABS
75.0000 mg | ORAL_TABLET | Freq: Every day | ORAL | Status: DC
  Administered 2018-06-03 – 2018-06-08 (×6): 75 mg via ORAL
  Filled 2018-06-02 (×9): qty 1

## 2018-06-02 MED ORDER — POTASSIUM CHLORIDE CRYS ER 20 MEQ PO TBCR
40.0000 meq | EXTENDED_RELEASE_TABLET | Freq: Once | ORAL | Status: AC
  Administered 2018-06-02: 40 meq via ORAL
  Filled 2018-06-02: qty 2

## 2018-06-02 MED ORDER — ALUM & MAG HYDROXIDE-SIMETH 200-200-20 MG/5ML PO SUSP
30.0000 mL | ORAL | Status: DC | PRN
  Administered 2018-06-03: 30 mL via ORAL
  Filled 2018-06-02: qty 30

## 2018-06-02 MED ORDER — LORAZEPAM 1 MG PO TABS: 0.0000 mg | ORAL_TABLET | Freq: Two times a day (BID) | ORAL | Status: DC

## 2018-06-02 MED ORDER — NICOTINE 21 MG/24HR TD PT24
21.0000 mg | MEDICATED_PATCH | Freq: Every day | TRANSDERMAL | Status: DC
  Administered 2018-06-03 – 2018-06-08 (×6): 21 mg via TRANSDERMAL
  Filled 2018-06-02 (×10): qty 1

## 2018-06-02 NOTE — ED Notes (Signed)
Called [facility] to see if they are ready for pt. According to [facility] they will call when ready.

## 2018-06-02 NOTE — Progress Notes (Signed)
Adult Psychoeducational Group Note  Date:  06/02/2018 Time:  9:03 PM  Group Topic/Focus:  Wrap-Up Group:   The focus of this group is to help patients review their daily goal of treatment and discuss progress on daily workbooks.  Participation Level:  Active  Participation Quality:  Appropriate  Affect:  Appropriate  Cognitive:  Appropriate  Insight: Appropriate  Engagement in Group:  Engaged  Modes of Intervention:  Discussion  Additional Comments: The patient expressed that he attended group.The patient also said that he rates today a 6.  Nash Shearer 06/02/2018, 9:03 PM

## 2018-06-02 NOTE — Tx Team (Signed)
Initial Treatment Plan 06/02/2018 7:26 PM [name] FXO:[id number]    PATIENT STRESSORS: Medication change or noncompliance Substance abuse   PATIENT STRENGTHS: Ability for insight Average or above average intelligence General fund of knowledge Motivation for treatment/growth   PATIENT IDENTIFIED PROBLEMS: Depression Alcohol Abuse Suicidal thoughts Auditory hallucinations "I guess get back on my medicine"                     DISCHARGE CRITERIA:  Ability to meet basic life and health needs Improved stabilization in mood, thinking, and/or behavior Reduction of life-threatening or endangering symptoms to within safe limits Verbal commitment to aftercare and medication compliance Withdrawal symptoms are absent or subacute and managed without 24-hour nursing intervention  PRELIMINARY DISCHARGE PLAN: Attend aftercare/continuing care group Return to previous living arrangement  PATIENT/FAMILY INVOLVEMENT: This treatment plan has been presented to and reviewed with the patient, [name], and/or family member, .  The patient and family have been given the opportunity to ask questions and make suggestions.  [place], [place], [place] 06/02/2018, 7:26 PM

## 2018-06-02 NOTE — Progress Notes (Signed)
Patient ID: [name], male   DOB: 08/11/1971, 47 y.o.   MRN: [id number] PER STATE REGULATIONS 482.30  THIS CHART WAS REVIEWED FOR MEDICAL NECESSITY WITH RESPECT TO THE PATIENT'S ADMISSION/DURATION OF STAY.  NEXT REVIEW DATE: 06/06/18  Roma Schanz, RN, BSN CASE MANAGER

## 2018-06-02 NOTE — ED Notes (Signed)
Pt presents with SI, depression and weight loss.  Pt accepted to [facility], but Sats running slightly low, pt to be observed overnight for re-eval in am.  Pt A&O x 3, no distress noted, calm & cooperative, Skin color good, Resp. Even and unlabored.  Pt sleeping at present.  Sitter at bedside.  Monitoring for safety.

## 2018-06-02 NOTE — ED Notes (Signed)
Report given to Alissa RN with [facility]. They will call when ready for transport.

## 2018-06-02 NOTE — ED Notes (Signed)
Bed: WBH40 Expected date:  Expected time:  Means of arrival:  Comments: 

## 2018-06-02 NOTE — ED Notes (Signed)
Pt taken off of nasal cannula

## 2018-06-02 NOTE — ED Notes (Signed)
Transported to [facility] by [employer]. Pt's brother took all of his belongings home at admission. Pt calm and cooperative.

## 2018-06-02 NOTE — ED Notes (Signed)
Bed: YW90 Expected date:  Expected time:  Means of arrival:  Comments: Room 4

## 2018-06-02 NOTE — Progress Notes (Signed)
[name] is a 47 year old male pt admitted on voluntary basis. On admission, he appears disheveled, anxious and tremulous and reports that he is withdrawing from alcohol. He reports that he has not been taking his medications the past couple months and reports that he has been drinking instead. He does endorse depression, auditory hallucinations and suicidal thoughts but it able to contract for safety while in the hospital. He reports that he wants to get back on his medications while he is here. He reports that he had been going to [facility] for med management and reports that he would like to return there. He reports that he has his own apartment and reports that he can return there once he is discharged. Giordano was escorted to the unit, oriented to the milieu and safety maintained.

## 2018-06-02 NOTE — ED Notes (Signed)
Pt transferred to TCU. Contracts for safety: Denies SI. This writer sitting outside of his room with him under constant observation.

## 2018-06-02 NOTE — BH Assessment (Signed)
[place] Assessment Progress Note  At 12:06 Leonia Reader, RN, [facility] calls.  Pt is still assigned to 504-1.  [place] will be ready to receive pt between 15:00 and 15:30.  Pt's nurse, Diane, has been notified.   Jalene Mullet, [place] Coordinator [phone]

## 2018-06-02 NOTE — ED Provider Notes (Signed)
I was asked to evaluate this patient for medical clearance.  Initially arrived with chest pain as well as alcohol withdrawal and suicidal ideation.  EKG was initially on concerning, troponin was negative.  Per chart review, he exhibited some abnormal vital signs yesterday afternoon, namely tachycardia and hypotension.  There is also a documented hypoxic episode.  This triggered repeat labs, with a positive d-dimer triggering CTPA, which did not reveal any pulmonary embolism.  His abnormal vital signs occurred over 12 hours ago.  During my assessment this morning, patient is awake, alert, eating breakfast with no complaints.  Reassuring vital signs at this time.  I personally reviewed the labs and EKG, patient appears to be medically cleared at this time.  Favoring alcohol withdrawal as the etiology of the tachycardia.  Patient currently receiving Ativan per CIWA protocol.  Clinical impression: Medically cleared  Estill Llerena. [place] Small, [place] [email or web address]    Maudie Flakes, MD 06/02/18 [phone]

## 2018-06-03 DIAGNOSIS — F250 Schizoaffective disorder, bipolar type: Principal | ICD-10-CM

## 2018-06-03 DIAGNOSIS — F419 Anxiety disorder, unspecified: Secondary | ICD-10-CM

## 2018-06-03 DIAGNOSIS — G4700 Insomnia, unspecified: Secondary | ICD-10-CM

## 2018-06-03 DIAGNOSIS — F4310 Post-traumatic stress disorder, unspecified: Secondary | ICD-10-CM

## 2018-06-03 LAB — LIPID PANEL
Cholesterol: 187 mg/dL (ref 0–200)
HDL: 101 mg/dL (ref >40)
LDL Cholesterol: 79 mg/dL (ref 0–99)
Total CHOL/HDL Ratio: 1.9 RATIO
Triglycerides: 33 mg/dL (ref <150)
VLDL: 7 mg/dL (ref 0–40)

## 2018-06-03 LAB — TSH: TSH: 2.741 u[IU]/mL (ref 0.350–4.500)

## 2018-06-03 LAB — HEMOGLOBIN A1C
Hgb A1c MFr Bld: 5.3 % (ref 4.8–5.6)
Mean Plasma Glucose: 105.41 mg/dL

## 2018-06-03 MED ORDER — PALIPERIDONE ER 6 MG PO TB24
6.0000 mg | ORAL_TABLET | Freq: Every day | ORAL | Status: DC
  Administered 2018-06-03 – 2018-06-04 (×2): 6 mg via ORAL
  Filled 2018-06-03 (×3): qty 1

## 2018-06-03 MED ORDER — ONDANSETRON 4 MG PO TBDP: 4.0000 mg | ORAL_TABLET | Freq: Four times a day (QID) | ORAL | Status: AC | PRN

## 2018-06-03 MED ORDER — LORAZEPAM 1 MG PO TABS
1.0000 mg | ORAL_TABLET | Freq: Every day | ORAL | Status: AC
  Administered 2018-06-06: 1 mg via ORAL
  Filled 2018-06-03: qty 1

## 2018-06-03 MED ORDER — ZIPRASIDONE MESYLATE 20 MG IM SOLR: 20.0000 mg | Freq: Two times a day (BID) | INTRAMUSCULAR | Status: DC | PRN

## 2018-06-03 MED ORDER — BENZTROPINE MESYLATE 1 MG/ML IJ SOLN: 1.0000 mg | Freq: Two times a day (BID) | INTRAMUSCULAR | Status: DC | PRN

## 2018-06-03 MED ORDER — TRAZODONE HCL 100 MG PO TABS
100.0000 mg | ORAL_TABLET | Freq: Every evening | ORAL | Status: DC | PRN
  Administered 2018-06-03 – 2018-06-07 (×3): 100 mg via ORAL
  Filled 2018-06-03: qty 1

## 2018-06-03 MED ORDER — VITAMIN B-1 100 MG PO TABS
100.0000 mg | ORAL_TABLET | Freq: Every day | ORAL | Status: DC
  Administered 2018-06-04 – 2018-06-08 (×5): 100 mg via ORAL
  Filled 2018-06-03 (×8): qty 1

## 2018-06-03 MED ORDER — TRAZODONE HCL 100 MG PO TABS
100.0000 mg | ORAL_TABLET | Freq: Every evening | ORAL | Status: DC | PRN
  Administered 2018-06-03: 100 mg via ORAL
  Filled 2018-06-03: qty 1

## 2018-06-03 MED ORDER — HYDROXYZINE HCL 50 MG PO TABS
50.0000 mg | ORAL_TABLET | Freq: Four times a day (QID) | ORAL | Status: DC | PRN
  Filled 2018-06-03: qty 10

## 2018-06-03 MED ORDER — ADULT MULTIVITAMIN W/MINERALS CH
1.0000 | ORAL_TABLET | Freq: Every day | ORAL | Status: DC
  Administered 2018-06-03 – 2018-06-08 (×6): 1 via ORAL
  Filled 2018-06-03 (×8): qty 1

## 2018-06-03 MED ORDER — PANTOPRAZOLE SODIUM 20 MG PO TBEC
20.0000 mg | DELAYED_RELEASE_TABLET | Freq: Two times a day (BID) | ORAL | Status: DC
  Administered 2018-06-03 – 2018-06-08 (×10): 20 mg via ORAL
  Filled 2018-06-03 (×13): qty 1

## 2018-06-03 MED ORDER — THIAMINE HCL 100 MG/ML IJ SOLN
100.0000 mg | Freq: Once | INTRAMUSCULAR | Status: AC
  Administered 2018-06-03: 100 mg via INTRAMUSCULAR
  Filled 2018-06-03: qty 2

## 2018-06-03 MED ORDER — HYDROXYZINE HCL 25 MG PO TABS: 25.0000 mg | ORAL_TABLET | Freq: Four times a day (QID) | ORAL | Status: DC | PRN

## 2018-06-03 MED ORDER — LORAZEPAM 1 MG PO TABS
1.0000 mg | ORAL_TABLET | Freq: Four times a day (QID) | ORAL | Status: AC | PRN
  Administered 2018-06-03: 1 mg via ORAL
  Filled 2018-06-03: qty 1

## 2018-06-03 MED ORDER — RISPERIDONE 2 MG PO TBDP
2.0000 mg | ORAL_TABLET | Freq: Four times a day (QID) | ORAL | Status: DC | PRN
  Administered 2018-06-03: 2 mg via ORAL
  Filled 2018-06-03: qty 2

## 2018-06-03 MED ORDER — LORAZEPAM 1 MG PO TABS
1.0000 mg | ORAL_TABLET | Freq: Two times a day (BID) | ORAL | Status: AC
  Administered 2018-06-05 (×2): 1 mg via ORAL
  Filled 2018-06-03 (×2): qty 1

## 2018-06-03 MED ORDER — LORAZEPAM 1 MG PO TABS
1.0000 mg | ORAL_TABLET | Freq: Four times a day (QID) | ORAL | Status: AC
  Administered 2018-06-03 (×4): 1 mg via ORAL
  Filled 2018-06-03 (×3): qty 1

## 2018-06-03 MED ORDER — BENZTROPINE MESYLATE 1 MG PO TABS: 1.0000 mg | ORAL_TABLET | Freq: Two times a day (BID) | ORAL | Status: DC | PRN

## 2018-06-03 MED ORDER — LOPERAMIDE HCL 2 MG PO CAPS: 2.0000 mg | ORAL_CAPSULE | ORAL | Status: AC | PRN

## 2018-06-03 MED ORDER — LORAZEPAM 1 MG PO TABS
1.0000 mg | ORAL_TABLET | Freq: Three times a day (TID) | ORAL | Status: AC
  Administered 2018-06-04 (×3): 1 mg via ORAL
  Filled 2018-06-03 (×3): qty 1

## 2018-06-03 NOTE — Progress Notes (Signed)
NUTRITION ASSESSMENT  Pt identified as at risk on the Malnutrition Screen Tool  INTERVENTION: 1. Supplements: Continue Ensure Enlive po BID, each supplement provides 350 kcal and 20 grams of protein  NUTRITION DIAGNOSIS: Unintentional weight loss related to sub-optimal intake as evidenced by pt report.   Goal: Pt to meet >/= 90% of their estimated nutrition needs.  Monitor:  PO intake  Assessment:  Pt admitted with alcohol withdrawal. Pt has lost 31 lb since November 2018, 13% wt loss x 11 months which is insignificant for time frame. Will continue Ensure supplements that have been ordered.  Height: Ht Readings from Last 1 Encounters:  06/02/18 5\' 11"  (1.803 m)    Weight: Wt Readings from Last 1 Encounters:  06/02/18 93 kg    Weight Hx: Wt Readings from Last 10 Encounters:  06/02/18 93 kg  06/01/18 99.8 kg  06/22/17 107.2 kg  08/21/16 117 kg  05/31/16 104.1 kg  02/22/16 104.8 kg  10/22/15 108.9 kg  10/02/15 104.5 kg  09/13/15 110.2 kg  06/29/15 97.5 kg    BMI:  Body mass index is 28.59 kg/m. Pt meets criteria for overweight based on current BMI.  Estimated Nutritional Needs: Kcal: 25-30 kcal/kg Protein: > 1 gram protein/kg Fluid: 1 ml/kcal  Diet Order:  Diet Order            Diet regular Room service appropriate? Yes; Fluid consistency: Thin  Diet effective now             Pt is also offered choice of unit snacks mid-morning and mid-afternoon.  Pt is eating as desired.   Lab results and medications reviewed.   Clayton Bibles, MS, RD, [place] Dietitian Pager: [phone] After Hours Pager: [phone]
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
## 2018-06-03 NOTE — Progress Notes (Signed)
Recreation Therapy Notes  Date: 10.17.19 Time: 1000 Location: 500 Hall Dayroom  Group Topic: Stress Management  Goal Area(s) Addresses:  Patient will verbalize importance of using healthy stress management.  Patient will identify positive emotions associated with healthy stress management.   Behavioral Response: Engaged  Intervention: Stress Management  Activity :  Deep Breathing & Guided Imagery.  LRT introduced the stress management techniques of deep breathing and guided imagery.  LRT led patients in the proper way to practice deep breathing.  LRT then played a guided imagery that allowed patients to further relax on the beach at sunset.  Education:  Stress Management, Discharge Planning.   Education Outcome: Acknowledges edcuation/In group clarification offered/Needs additional education  Clinical Observations/Feedback: Pt stress can be caused by "living in a one bedroom apartment with your brother".  Pt also stated stress shows up as sweating or adrenaline.  Pt was able to focus for a little while during the meditation.  Pt talked about how he does meditate on his own and that listening to other people speak helps him to relax.      Victorino Sparrow, LRT/CTRS       Ria Comment, Brittnye Josephs A 06/03/2018 10:57 AM

## 2018-06-03 NOTE — Plan of Care (Signed)
  Problem: Safety: Goal: Periods of time without injury will increase Outcome: Progressing   Problem: Self-Concept: Goal: Level of anxiety will decrease Outcome: Not Progressing DAR NOTE: Patient presents with anxious affect and irritable mood.  Denies suicidal thought but reports auditory and and visual hallucinations.  Reports symptoms of tremors, cravings, nausea, chilling, cramping and irritability during assessment.  Ativan given per CIWA protocol.  Rates depression at 6, hopelessness at 8, and anxiety at 9.  Maintained on routine safety checks.  Medications given as prescribed.  Support and encouragement offered as needed.  Attended group and participated.  States goal for today is "see the doctor and discuss plan."  Patient visible in milieu with minimal interactions.  Appears restless, fidgety and preoccupied.  Patient concerned about his apartment and personal belongings. Mom called to reassure him.  Patient is safe on the unit.

## 2018-06-03 NOTE — BHH Suicide Risk Assessment (Signed)
[facility] Admission Suicide Risk Assessment   Nursing information obtained from:  Patient Demographic factors:  Male, Caucasian, Low socioeconomic status, Unemployed Current Mental Status:  Suicidal ideation indicated by patient, Self-harm thoughts Loss Factors:  NA Historical Factors:  Prior suicide attempts Risk Reduction Factors:  Positive coping skills or problem solving skills  Total Time spent with patient: 1 hour Principal Problem: Schizoaffective disorder, bipolar type ([place]) Diagnosis:   Patient Active Problem List   Diagnosis Date Noted  . Schizoaffective disorder, bipolar type ([place]) [F25.0] 06/02/2018  . Low back pain [M54.5] 08/21/2016  . Alcohol dependence with uncomplicated withdrawal ([place]) [F10.230] 02/22/2016  . Alcohol-induced mood disorder ([place]) [F10.94] 02/22/2016  . Gout [M10.9] 10/22/2015  . GERD (gastroesophageal reflux disease) [K21.9] 10/22/2015  . Alcohol withdrawal seizure with complication ([place]) [E09.233, R56.9] 09/30/2015  . Schizoaffective disorder, depressive type ([place]) [F25.1]   . History of alcohol abuse [F10.11]   . Tobacco dependence due to cigarettes [F17.210] 11/20/2014  . Post traumatic stress disorder (PTSD) [F43.10] 11/03/2011   Subjective Data: see H&P  Continued Clinical Symptoms:  Alcohol Use Disorder Identification Test Final Score (AUDIT): 38 The "Alcohol Use Disorders Identification Test", Guidelines for Use in Primary Care, Second Edition.  World Pharmacologist [facility]). Score between 0-7:  no or low risk or alcohol related problems. Score between 8-15:  moderate risk of alcohol related problems. Score between 16-19:  high risk of alcohol related problems. Score 20 or above:  warrants further diagnostic evaluation for alcohol dependence and treatment.   Psychiatric Specialty Exam: Physical Exam  Nursing note and vitals reviewed.     Blood pressure (!) 135/100, pulse (!) 114, temperature 98 F (36.7 C), resp. rate 18, height 5\' 11"   (1.803 m), weight 93 kg.Body mass index is 28.59 kg/m.     COGNITIVE FEATURES THAT CONTRIBUTE TO RISK:  None    SUICIDE RISK:   Moderate:  Frequent suicidal ideation with limited intensity, and duration, some specificity in terms of plans, no associated intent, good self-control, limited dysphoria/symptomatology, some risk factors present, and identifiable protective factors, including available and accessible social support.  PLAN OF CARE: see H&P  I certify that inpatient services furnished can reasonably be expected to improve the patient's condition.   [name], MD 06/03/2018, 5:59 PM

## 2018-06-03 NOTE — Progress Notes (Signed)
D    Pt requested his bedtime medication and said his voices were not as bad but he still sees things that other people cant see sometimes   He complains of sweating and pins and needles and tremors and said the withdrawal is getting better  A   Verbal support given   Medications administered and effectiveness monitored  Discussed signs and symptoms of withdrawal  R   Pt remains safe at this time

## 2018-06-03 NOTE — Progress Notes (Signed)
Patient ID: Auther Lyerly, male   DOB: February 16, 1971, 47 y.o.   MRN: [id number]   D: Patient has a flat affect on approach tonight. Reports not being here for a couple of years. Reporting increased depression and hearing voices at times. Withdrawing from alcohol and on Ativan protocol. CIWA=7 tonight and blood pressure elevated. Given ativan 1mg  tonight. Patient having a hard time sleeping tonight even with his regular seroquel dose and trazodone ordered. This has been one of his complaints from home and he reports it causes him more confusion when he doesn't sleep. He has been a little confused tonight after night medication and unknown if it is his schizoaffective d/o, withdrawals, or lack of sleep in general. He reports a high tolerance to medications.  A: Staff will continue to monitor on q 15 minute checks, follow treatment plan, and give medications as ordered. R: Cooperative on the unit. Needing some redirection

## 2018-06-03 NOTE — Progress Notes (Signed)
Adult Psychoeducational Group Note  Date:  06/03/2018 Time:  8:58 PM  Group Topic/Focus:  Wrap-Up Group:   The focus of this group is to help patients review their daily goal of treatment and discuss progress on daily workbooks.  Participation Level:  Active  Participation Quality:  Appropriate  Affect:  Appropriate  Cognitive:  Appropriate  Insight: Appropriate  Engagement in Group:  Engaged  Modes of Intervention:  Discussion  Additional Comments: The patient expressed that he rates today a 6.The patient also said that he attended all groups.  Nash Shearer 06/03/2018, 8:58 PM

## 2018-06-03 NOTE — BHH Group Notes (Signed)
LCSW Group Therapy Note  06/03/2018 1:23 PM  Type of Therapy/Topic:  Group Therapy:  Emotion Regulation  Participation Level:  Active   Description of Group:   The purpose of this group is to assist patients in learning to regulate negative emotions and experience positive emotions. Patients will be guided to discuss ways in which they have been vulnerable to their negative emotions. These vulnerabilities will be juxtaposed with experiences of positive emotions or situations, and patients will be challenged to use positive emotions to combat negative ones. Special emphasis will be placed on coping with negative emotions in conflict situations, and patients will process healthy conflict resolution skills.  Therapeutic Goals: 1. Patient will identify two positive emotions or experiences to reflect on in order to balance out   negative emotions 2. Patient will label two or more emotions that they find the most difficult to experience 3. Patient will demonstrate positive conflict resolution skills through discussion and/or role plays  Summary of Patient Progress: [name] attended the entire session. He described fear as an emotion that he has a difficult time regulating. He reported knowing that it is a problem because he can feel the adrenalin in his body. He tries to count and walk away when he manages this feeling.    Therapeutic Modalities:   Cognitive Behavioral Therapy Feelings Identification Dialectical Behavioral Therapy   Lawana Pai MSW Intern 06/03/2018 1:23 PM

## 2018-06-03 NOTE — Progress Notes (Signed)
Recreation Therapy Notes  INPATIENT RECREATION THERAPY ASSESSMENT  Patient Details Name: [name] MRN: [id number] DOB: [birth year] Today's Date: 06/03/2018       Information Obtained From: Patient  Able to Participate in Assessment/Interview: Yes  Patient Presentation: Alert  Reason for Admission (Per Patient): Med Non-Compliance, Substance Abuse  Patient Stressors: Other (Comment)(Living with brother)  Coping Skills:   Isolation, Sports, TV, Music, Meditate, Deep Breathing, Substance Abuse, Impulsivity, Read  Leisure Interests (2+):  Games - Video games, Individual - Other (Comment)(Build RC cars)  Frequency of Recreation/Participation: Other (Comment)(Video games- Weekly; Build RC cars- Monthly)  Awareness of Community Resources:  Yes  Community Resources:  Park  Current Use: Yes  If no, Barriers?:    Expressed Interest in [place]: No  [employer] of Residence:  Investment banker, corporate  Patient Main Form of Transportation: Other (Comment)(Scooter)  Patient Strengths:  Empathy; Perserverance  Patient Identified Areas of Improvement:  "Quiet being agoraphobic; Fear makes me angry"  Patient Goal for Hospitalization:  "Get to feeling better, get things set up with doctors"  Current SI (including self-harm):  No  Current HI:  No  Current AVH: Yes(Pt rated a 2 out of 10.)  Staff Intervention Plan: Group Attendance, Collaborate with Interdisciplinary Treatment Team  Consent to Intern Participation: N/A    Victorino Sparrow, LRT/CTRS  Victorino Sparrow A 06/03/2018, 12:51 PM

## 2018-06-04 MED ORDER — PALIPERIDONE PALMITATE ER 156 MG/ML IM SUSY
156.0000 mg | PREFILLED_SYRINGE | INTRAMUSCULAR | Status: DC
  Administered 2018-06-07: 156 mg via INTRAMUSCULAR
  Filled 2018-06-04: qty 1

## 2018-06-04 MED ORDER — COLCHICINE 0.6 MG PO TABS
0.6000 mg | ORAL_TABLET | Freq: Every day | ORAL | Status: DC
  Administered 2018-06-04 – 2018-06-08 (×5): 0.6 mg via ORAL
  Filled 2018-06-04 (×7): qty 1

## 2018-06-04 MED ORDER — ALLOPURINOL 100 MG PO TABS
100.0000 mg | ORAL_TABLET | Freq: Every day | ORAL | Status: DC
  Administered 2018-06-04 – 2018-06-08 (×5): 100 mg via ORAL
  Filled 2018-06-04 (×7): qty 1

## 2018-06-04 MED ORDER — PALIPERIDONE PALMITATE ER 234 MG/1.5ML IM SUSY
234.0000 mg | PREFILLED_SYRINGE | Freq: Once | INTRAMUSCULAR | Status: AC
  Administered 2018-06-04: 234 mg via INTRAMUSCULAR
  Filled 2018-06-04: qty 1.5

## 2018-06-04 MED FILL — INVEGA SUSTENNA 156 MG PREF: 156 | 30 days supply | Qty: 1 | Fill #0
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
## 2018-06-04 NOTE — Progress Notes (Signed)
Recreation Therapy Notes  Date: 10.18.19 Time: 1000 Location: 500 Hall Dayroom  Group Topic: Communication, Team Building, Problem Solving  Goal Area(s) Addresses:  Patient will effectively work with peer towards shared goal.  Patient will identify skill used to make activity successful.  Patient will identify how skills used during activity can be used to reach post d/c goals.   Behavioral Response: Engaged  Intervention: STEM Activity   Activity: Metallurgist. In teams, patients were asked to build the tallest freestanding tower possible out of 15 pipe cleaners. Systematically resources were removed, for example patient ability to use both hands and patient ability to verbally communicate.    Education:Social Skills, Discharge Planning.   Education Outcome: Acknowledges education/In group clarification offered/Needs additional education.   Clinical Observations/Feedback:  Pt worked well with peers and was very engaged in the activity.  Pt stated "sometimes you have to use what you have and make the most of it".  Pt expressed in some situations, you have to rely on yourself to get through obstacles.  Pt went on to say, he was ready to split from his family as well.     Victorino Sparrow, LRT/CTRS     Ria Comment, Michio Thier A 06/04/2018 11:56 AM

## 2018-06-04 NOTE — BHH Group Notes (Signed)
Adult Psychoeducational Group Note  Date:  06/04/2018 Time:  9:36 PM  Group Topic/Focus:  Wrap-Up Group:   The focus of this group is to help patients review their daily goal of treatment and discuss progress on daily workbooks.  Participation Level:  Active  Participation Quality:  Appropriate and Attentive  Affect:  Appropriate  Cognitive:  Alert and Appropriate  Insight: Appropriate and Good  Engagement in Group:  Engaged  Modes of Intervention:  Discussion and Education  Additional Comments:  Pt attended and participated in wrap up group this evening. Pt day started off good, but once the pain from their gout began to kick in, their day started to decrease. Pt did not complete their goal, which was to get some rest, but they are hoping to get some sleep tonight.    [name] 06/04/2018, 9:36 PM

## 2018-06-04 NOTE — Tx Team (Signed)
Interdisciplinary Treatment and Diagnostic Plan Update  06/04/2018 Time of Session: 10:37 AM  [name] MRN: [id number]  Principal Diagnosis: Schizoaffective disorder, bipolar type [facility])  Secondary Diagnoses: Principal Problem:   Schizoaffective disorder, bipolar type ([place]) Active Problems:   Post traumatic stress disorder (PTSD)   Current Medications:  Current Facility-Administered Medications  Medication Dose Route Frequency Provider Last Rate Last Dose  . acetaminophen (TYLENOL) tablet 650 mg  650 mg Oral Q6H PRN Suella Broad, FNP      . alum & mag hydroxide-simeth (MAALOX/MYLANTA) 200-200-20 MG/5ML suspension 30 mL  30 mL Oral Q4H PRN Suella Broad, FNP   30 mL at 06/03/18 0927  . benztropine (COGENTIN) tablet 1 mg  1 mg Oral BID PRN [name], MD       Or  . benztropine mesylate (COGENTIN) injection 1 mg  1 mg Intramuscular BID PRN [name], MD      . feeding supplement (ENSURE ENLIVE) (ENSURE ENLIVE) liquid 237 mL  237 mL Oral BID BM [name], MD      . hydrOXYzine (ATARAX/VISTARIL) tablet 50 mg  50 mg Oral Q6H PRN [name], MD      . loperamide (IMODIUM) capsule 2-4 mg  2-4 mg Oral PRN Lindell Spar I, NP      . LORazepam (ATIVAN) tablet 1 mg  1 mg Oral Q6H PRN Lindell Spar I, NP   1 mg at 06/03/18 2302  . LORazepam (ATIVAN) tablet 1 mg  1 mg Oral TID Lindell Spar I, NP   1 mg at 06/04/18 0747   Followed by  . [START ON 06/05/2018] LORazepam (ATIVAN) tablet 1 mg  1 mg Oral BID Lindell Spar I, NP       Followed by  . [START ON 06/06/2018] LORazepam (ATIVAN) tablet 1 mg  1 mg Oral Daily Nwoko, Agnes I, NP      . magnesium hydroxide (MILK OF MAGNESIA) suspension 30 mL  30 mL Oral Daily PRN Starkes-Perry, Gayland Curry, FNP      . multivitamin with minerals tablet 1 tablet  1 tablet Oral Daily Nwoko, Agnes I, NP   1 tablet at 06/04/18 0747  . nicotine (NICODERM CQ - dosed in mg/24 hours) patch 21 mg   21 mg Transdermal Daily Suella Broad, FNP   21 mg at 06/04/18 0746  . ondansetron (ZOFRAN-ODT) disintegrating tablet 4 mg  4 mg Oral Q6H PRN Nwoko, Agnes I, NP      . paliperidone (INVEGA) 24 hr tablet 6 mg  6 mg Oral Daily [name], MD   6 mg at 06/04/18 0746  . pantoprazole (PROTONIX) EC tablet 20 mg  20 mg Oral BID [name], MD   20 mg at 06/04/18 0747  . risperiDONE (RISPERDAL M-TABS) disintegrating tablet 2 mg  2 mg Oral Q6H PRN [name], MD   2 mg at 06/03/18 2302   Or  . ziprasidone (GEODON) injection 20 mg  20 mg Intramuscular Q12H PRN [name], MD      . sertraline (ZOLOFT) tablet 75 mg  75 mg Oral Daily Suella Broad, FNP   75 mg at 06/04/18 0746  . thiamine (VITAMIN B-1) tablet 100 mg  100 mg Oral Daily Lindell Spar I, NP   100 mg at 06/04/18 0748  . traZODone (DESYREL) tablet 100 mg  100 mg Oral QHS PRN,MR X 1 [name], MD   100 mg at 06/03/18 2104  PTA Medications: Medications Prior to Admission  Medication Sig Dispense Refill Last Dose  . QUEtiapine (SEROQUEL) 200 MG tablet Take 1 tablet (200 mg total) by mouth every morning. (Patient not taking: Reported on 06/01/2018) 10 tablet 0 Not Taking at Unknown time  . QUEtiapine (SEROQUEL) 300 MG tablet Take 2 tablets (600 mg total) by mouth at bedtime. (Patient not taking: Reported on 06/22/2017) 10 tablet 0 Not Taking  . sertraline (ZOLOFT) 25 MG tablet Take 3 tablets (75 mg total) by mouth daily. (Patient not taking: Reported on 06/01/2018) 10 tablet 0 Not Taking at Unknown time    Patient Stressors: Medication change or noncompliance Substance abuse  Patient Strengths: Ability for insight Average or above average intelligence General fund of knowledge Motivation for treatment/growth  Treatment Modalities: Medication Management, Group therapy, Case management,  1 to 1 session with clinician, Psychoeducation, Recreational  therapy.   Physician Treatment Plan for Primary Diagnosis: Schizoaffective disorder, bipolar type ([place]) Long Term Goal(s): Improvement in symptoms so as ready for discharge  Short Term Goals: Ability to identify and develop effective coping behaviors will improve Ability to identify triggers associated with substance abuse/mental health issues will improve  Medication Management: Evaluate patient's response, side effects, and tolerance of medication regimen.  Therapeutic Interventions: 1 to 1 sessions, Unit Group sessions and Medication administration.  Evaluation of Outcomes: Progressing  Physician Treatment Plan for Secondary Diagnosis: Principal Problem:   Schizoaffective disorder, bipolar type ([place]) Active Problems:   Post traumatic stress disorder (PTSD)   Long Term Goal(s): Improvement in symptoms so as ready for discharge  Short Term Goals: Ability to identify and develop effective coping behaviors will improve Ability to identify triggers associated with substance abuse/mental health issues will improve  Medication Management: Evaluate patient's response, side effects, and tolerance of medication regimen.  Therapeutic Interventions: 1 to 1 sessions, Unit Group sessions and Medication administration.  Evaluation of Outcomes: Progressing   RN Treatment Plan for Primary Diagnosis: Schizoaffective disorder, bipolar type ([place]) Long Term Goal(s): Knowledge of disease and therapeutic regimen to maintain health will improve  Short Term Goals: Ability to identify and develop effective coping behaviors will improve and Compliance with prescribed medications will improve  Medication Management: RN will administer medications as ordered by provider, will assess and evaluate patient's response and provide education to patient for prescribed medication. RN will report any adverse and/or side effects to prescribing provider.  Therapeutic Interventions: 1 on 1 counseling sessions,  Psychoeducation, Medication administration, Evaluate responses to treatment, Monitor vital signs and CBGs as ordered, Perform/monitor CIWA, COWS, AIMS and Fall Risk screenings as ordered, Perform wound care treatments as ordered.  Evaluation of Outcomes: Progressing   LCSW Treatment Plan for Primary Diagnosis: Schizoaffective disorder, bipolar type ([place]) Long Term Goal(s): Safe transition to appropriate next level of care at discharge, Engage patient in therapeutic group addressing interpersonal concerns.  Short Term Goals: Engage patient in aftercare planning with referrals and resources  Therapeutic Interventions: Assess for all discharge needs, 1 to 1 time with Social worker, Explore available resources and support systems, Assess for adequacy in community support network, Educate family and significant other(s) on suicide prevention, Complete Psychosocial Assessment, Interpersonal group therapy.  Evaluation of Outcomes: Met  Return home, follow up outpt at [place] in Treatment: Attending groups: Yes Participating in groups: Yes Taking medication as prescribed: Yes Toleration medication: Yes, no side effects reported at this time Family/Significant other contact made: No Patient understands diagnosis: Yes AEB asking for help with psychiatric symptoms  and withdrawal Discussing patient identified problems/goals with staff: Yes Medical problems stabilized or resolved: Yes Denies suicidal/homicidal ideation: Yes Issues/concerns per patient self-inventory: None Other: N/A  New problem(s) identified: None identified at this time.   New Short Term/Long Term Goal(s): None identified at this time.   Discharge Plan or Barriers:   Reason for Continuation of Hospitalization: Anxiety   Depression Hallucinations  Medication stabilization  Withdrawal symptoms  Estimated Length of Stay: 10/23  Attendees: Patient: [name] 06/04/2018  10:37 AM  Physician:  Maris Berger, MD 06/04/2018  10:37 AM  Nursing: Sena Hitch, RN 06/04/2018  10:37 AM  RN Care Manager: Lars Pinks, RN 06/04/2018  10:37 AM  Social Worker: Ripley Fraise 06/04/2018  10:37 AM  Recreational Therapist: Winfield Cunas 06/04/2018  10:37 AM  Other: Norberto Sorenson 06/04/2018  10:37 AM  Other:  06/04/2018  10:37 AM    Scribe for Treatment Team:  Roque Lias LCSW 06/04/2018 10:37 AM

## 2018-06-04 NOTE — Plan of Care (Signed)
  Problem: Activity: Goal: Interest or engagement in activities will improve Outcome: Progressing   Problem: Safety: Goal: Periods of time without injury will increase Outcome: Progressing  DAR NOTE: Patient presents with anxious affect and mood.  Denies suicidal thoughts, pain and visual hallucination but reports auditory hallucinations.  Reports withdrawal symptoms of tremors, diarrhea, cramping, chilling and cravings on self inventory form.  Described energy level as normal and concentration as poor.  Rates depression at 7, hopelessness at 3, and anxiety at 9.  Maintained on routine safety checks.  Medications given as prescribed.  Support and encouragement offered as needed.  Attended group and participated.  States goal for today is "get some real sleep tonight."  Patient visible in the dayroom with minimal interactions.

## 2018-06-04 NOTE — Progress Notes (Signed)
Pt did attend morning group and actively participated.

## 2018-06-04 NOTE — Progress Notes (Signed)
Pt limping in hallway,.  Pt c/o gout pain in his leg.  Pt sts he has some withdrawal pain and requests Ativan.  Pt attends group and sts he got some sleep today.  Pt denies SI, HI and AVH and verbally contracts for safety. Pt is med compliant. Pt score a 6 on CIWA and Ativan not given. Pt given PRN medication for gout pain. Pt offered support and encouragement. Pt to bed early and remains safe on unit.

## 2018-06-04 NOTE — [facility] Suicide Risk Assessment (Signed)
[place] INPATIENT:  Family/Significant Other Suicide Prevention Education  Suicide Prevention Education:  Patient Refusal for Family/Significant Other Suicide Prevention Education: The patient [name] has refused to provide written consent for family/significant other to be provided Family/Significant Other Suicide Prevention Education during admission and/or prior to discharge.  Physician notified.  [place] 06/04/2018, 2:07 PM
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
## 2018-06-04 NOTE — [facility] Group Notes (Signed)
LCSW Group Therapy Note  06/04/2018 1:28 PM  Type of Therapy/Topic:  Group Therapy:  Emotion Regulation  Participation Level:  Active   Description of Group:   The purpose of this group is to assist patients in learning to regulate negative emotions and experience positive emotions. Patients will be guided to discuss ways in which they have been vulnerable to their negative emotions. These vulnerabilities will be juxtaposed with experiences of positive emotions or situations, and patients will be challenged to use positive emotions to combat negative ones. Special emphasis will be placed on coping with negative emotions in conflict situations, and patients will process healthy conflict resolution skills.  Therapeutic Goals: 1. Patient will identify two positive emotions or experiences to reflect on in order to balance out   negative emotions 2. Patient will label two or more emotions that they find the most difficult to experience 3. Patient will demonstrate positive conflict resolution skills through discussion and/or role plays  Summary of Patient Progress: [name] attended the entire session. He described fear as an emotion that he has a difficult time regulating. He reported knowing that it is a problem because he can feel the adrenalin in his body. He tries to count and walk away when he manages this feeling. "[name] is being full of fear and still doing what I am compelled to do."  Talked about his dream of moving to [place].   Therapeutic Modalities:   Cognitive Behavioral Therapy Feelings Identification [place] MSW Intern 06/04/2018 1:28 PM

## 2018-06-05 DIAGNOSIS — F1099 Alcohol use, unspecified with unspecified alcohol-induced disorder: Secondary | ICD-10-CM

## 2018-06-05 DIAGNOSIS — M109 Gout, unspecified: Secondary | ICD-10-CM

## 2018-06-05 DIAGNOSIS — R945 Abnormal results of liver function studies: Secondary | ICD-10-CM

## 2018-06-05 LAB — HEPATIC FUNCTION PANEL
ALT: 174 U/L — ABNORMAL HIGH (ref 0–44)
AST: 114 U/L — ABNORMAL HIGH (ref 15–41)
Albumin: 3.8 g/dL (ref 3.5–5.0)
Alkaline Phosphatase: 84 U/L (ref 38–126)
Bilirubin, Direct: 0.3 mg/dL — ABNORMAL HIGH (ref 0.0–0.2)
Indirect Bilirubin: 0.7 mg/dL (ref 0.3–0.9)
Total Bilirubin: 1 mg/dL (ref 0.3–1.2)
Total Protein: 6.7 g/dL (ref 6.5–8.1)

## 2018-06-05 NOTE — [facility] Group Notes (Signed)
  [facility] LCSW Group Therapy Note  Date/Time:  06/05/2018 11:15AM-12:00PM  Type of Therapy and Topic:  Group Therapy:  Feelings About Hospitalization  Participation Level:  Active   Description of Group This process group involved patients discussing their feelings related to being hospitalized, as well as the benefits they see to being in the hospital.  These feelings and benefits were itemized.  The group then brainstormed specific ways in which they could seek those same benefits when they discharge and return home.  Therapeutic Goals 1. Patient will identify and describe positive and negative feelings related to hospitalization 2. Patient will verbalize benefits of hospitalization to themselves personally 3. Patients will brainstorm together ways they can obtain similar benefits in the outpatient setting, identify barriers to wellness and possible solutions  Summary of Patient Progress:  The patient expressed his primary feelings about being hospitalized are "relieved" because he felt out of control and felt he needed a "reboot."  Therapeutic Modalities Cognitive Behavioral Therapy Motivational Interviewing    Selmer Dominion, LCSW 06/05/2018, 8:45 AM

## 2018-06-05 NOTE — Progress Notes (Signed)
Did not attend group 

## 2018-06-05 NOTE — Progress Notes (Signed)
Pt sts he had an ok day.  Was tired due to lack of sleep.  Pt sts his leg feels a little better.  Pt is med compliant but does not attend evening group.  Pt continues to endorse AVH.  Pt verbally contracts for safety

## 2018-06-05 NOTE — Progress Notes (Signed)
Patient ID: [name], male   DOB: [birth year], 47 y.o.   MRN: [id number] D: Patient with blunted affect, reports a poor sleep quality last night, a good appetite, a low energy level, and poor concentration level.  Pt rates his depression as 8 (10 being the worst), his hopelessness level as 5 (10 being the worst), and his anxiety level as 8 (10 being the worst).  Pt denies SI/HI, endorses +AH of voices telling his "we know what's coming", and reports +VH of nonspecific things and says "nothing looks right". Pt states that what is important for him today is "to feel better", and "go to groups try to stay off my feet".  R: Pt reported pain of 8/10 in his ankles and feet earlier in the shift, and was restarted on his Colchicine and his allopurinol. Pt also being given all other meds as ordered.  Pt being maintained on Q15 minute checks for safety.  R: Will continue to monitor on Q15 minute safety checks.

## 2018-06-05 NOTE — Progress Notes (Signed)
[facility] MD Progress Note  06/05/2018 11:30 AM [name]  MRN:  [id number] Subjective:  [name] is a 47 y/o M with history of schizoaffective disorder bipolar type and PTSD who was admitted voluntarily from [place] where he presented with worsening depression, SI, poor sleep, AH, VH, worsening alcohol use, and poor adherence to his outpatient treatment regimen. He was started on alcohol withdrawal protocol. He was medically cleared and then transferred to [facility] for additional treatment and stabilization. He was resumed on zoloft, and started on trial of Invega with potential plan of transitioning to long-acting injectable form.   Objective: Patient is seen and examined.  Patient is a 47 year old male with the above-stated past psychiatric history who is seen in follow-up.  He continues to slowly improved.  He stated his auditory hallucinations have decreased, but "they never really go away".  He denied any suicidal or homicidal ideation.  He denied any side effects to his current medications.  He denied any problems with alcohol withdrawal, but his blood pressure is elevated at 144/100, and he was tachycardic 119 this morning.  He does continue to complain of gout symptoms, and is currently on Tylenol as well as ibuprofen and allopurinol and colchicine.  I discussed with him stopping the Tylenol the day given the elevated liver function enzymes.  He is afebrile.  He slept approximately 5 hours last p.m.  His last CIWA was on 10/18 at 8 PM, and was a 6.  On admission his AST was 349, and his ALT was 350.  His sodium was 133, and his potassium was low at 3.2.  Blood alcohol on admission was 265.  Drug screen was positive for marijuana.  On admission his chest x-ray was negative.  He had a CT of the chest on admission, and was negative for pulmonary emboli.  His EKG on admission although the baseline was off, was essentially normal.   Principal Problem: Schizoaffective disorder, bipolar type  [facility]) Diagnosis:   Patient Active Problem List   Diagnosis Date Noted  . Schizoaffective disorder, bipolar type ([place]) [F25.0] 06/02/2018  . Low back pain [M54.5] 08/21/2016  . Alcohol dependence with uncomplicated withdrawal ([place]) [F10.230] 02/22/2016  . Alcohol-induced mood disorder ([place]) [F10.94] 02/22/2016  . Gout [M10.9] 10/22/2015  . GERD (gastroesophageal reflux disease) [K21.9] 10/22/2015  . Alcohol withdrawal seizure with complication ([place]) [D66.440, R56.9] 09/30/2015  . Schizoaffective disorder, depressive type ([place]) [F25.1]   . History of alcohol abuse [F10.11]   . Tobacco dependence due to cigarettes [F17.210] 11/20/2014  . Post traumatic stress disorder (PTSD) [F43.10] 11/03/2011   Total Time spent with patient: 15 minutes  Past Psychiatric History: See admission H&P  Past Medical History:  Past Medical History:  Diagnosis Date  . Alcohol abuse   . Anxiety    at age 67  . Depression    at age 18  . Psychosis ([place])   . PTSD (post-traumatic stress disorder)   . Schizoaffective disorder   . Seizure disorder ([place])    related to etoh seizure    Past Surgical History:  Procedure Laterality Date  . FOOT SURGERY     right  . HAND SURGERY     Family History:  Family History  Problem Relation Age of Onset  . Alcoholism Mother   . CAD Father   . Depression Brother    Family Psychiatric  History: See admission H&P Social History:  Social History   Substance and Sexual Activity  Alcohol Use Yes  . Alcohol/week:  0.0 standard drinks  . Frequency: Never   Comment: drinking daily      Social History   Substance and Sexual Activity  Drug Use Yes  . Frequency: 1.0 times per week  . Types: Marijuana   Comment: THC 2 to 3 times per month. last use 09/24/2015    Social History   Socioeconomic History  . Marital status: Divorced    Spouse name: Not on file  . Number of children: Not on file  . Years of education: Not on file  . Highest education level: Not  on file  Occupational History  . Not on file  Social Needs  . Financial resource strain: Not on file  . Food insecurity:    Worry: Not on file    Inability: Not on file  . Transportation needs:    Medical: Not on file    Non-medical: Not on file  Tobacco Use  . Smoking status: Current Every Day Smoker    Packs/day: 1.00    Years: 24.00    Pack years: 24.00    Types: Cigarettes  . Smokeless tobacco: Never Used  Substance and Sexual Activity  . Alcohol use: Yes    Alcohol/week: 0.0 standard drinks    Frequency: Never    Comment: drinking daily   . Drug use: Yes    Frequency: 1.0 times per week    Types: Marijuana    Comment: THC 2 to 3 times per month. last use 09/24/2015  . Sexual activity: Yes    Birth control/protection: Condom  Lifestyle  . Physical activity:    Days per week: Not on file    Minutes per session: Not on file  . Stress: Not on file  Relationships  . Social connections:    Talks on phone: Not on file    Gets together: Not on file    Attends religious service: Not on file    Active member of club or organization: Not on file    Attends meetings of clubs or organizations: Not on file    Relationship status: Not on file  Other Topics Concern  . Not on file  Social History Narrative  . Not on file   Additional Social History:                         Sleep: Fair  Appetite:  Fair  Current Medications: Current Facility-Administered Medications  Medication Dose Route Frequency Provider Last Rate Last Dose  . allopurinol (ZYLOPRIM) tablet 100 mg  100 mg Oral Daily [name], MD   100 mg at 06/05/18 0754  . alum & mag hydroxide-simeth (MAALOX/MYLANTA) 200-200-20 MG/5ML suspension 30 mL  30 mL Oral Q4H PRN Suella Broad, FNP   30 mL at 06/03/18 0927  . benztropine (COGENTIN) tablet 1 mg  1 mg Oral BID PRN [name], MD       Or  . benztropine mesylate (COGENTIN) injection 1 mg  1 mg Intramuscular BID PRN  [name], MD      . colchicine tablet 0.6 mg  0.6 mg Oral Daily [name], MD   0.6 mg at 06/05/18 0754  . feeding supplement (ENSURE ENLIVE) (ENSURE ENLIVE) liquid 237 mL  237 mL Oral BID BM [place], Christopher T, MD      . hydrOXYzine (ATARAX/VISTARIL) tablet 50 mg  50 mg Oral Q6H PRN [name], MD      . loperamide (IMODIUM) capsule  2-4 mg  2-4 mg Oral PRN Lindell Spar I, NP      . LORazepam (ATIVAN) tablet 1 mg  1 mg Oral Q6H PRN Lindell Spar I, NP   1 mg at 06/03/18 2302  . LORazepam (ATIVAN) tablet 1 mg  1 mg Oral BID Lindell Spar I, NP   1 mg at 06/05/18 0757   Followed by  . [START ON 06/06/2018] LORazepam (ATIVAN) tablet 1 mg  1 mg Oral Daily [place], Agnes I, NP      . magnesium hydroxide (MILK OF MAGNESIA) suspension 30 mL  30 mL Oral Daily PRN Starkes-Perry, Gayland Curry, FNP      . multivitamin with minerals tablet 1 tablet  1 tablet Oral Daily Lindell Spar I, NP   1 tablet at 06/05/18 0754  . nicotine (NICODERM CQ - dosed in mg/24 hours) patch 21 mg  21 mg Transdermal Daily Suella Broad, FNP   21 mg at 06/05/18 0757  . ondansetron (ZOFRAN-ODT) disintegrating tablet 4 mg  4 mg Oral Q6H PRN Lindell Spar I, NP      . Derrill Memo ON 06/07/2018] paliperidone (INVEGA SUSTENNA) injection 156 mg  156 mg Intramuscular Q30 days Maris Berger T, MD      . pantoprazole (PROTONIX) EC tablet 20 mg  20 mg Oral BID [name], MD   20 mg at 06/05/18 0754  . risperiDONE (RISPERDAL M-TABS) disintegrating tablet 2 mg  2 mg Oral Q6H PRN [name], MD   2 mg at 06/03/18 2302   Or  . ziprasidone (GEODON) injection 20 mg  20 mg Intramuscular Q12H PRN [name], MD      . sertraline (ZOLOFT) tablet 75 mg  75 mg Oral Daily Suella Broad, FNP   75 mg at 06/05/18 0754  . thiamine (VITAMIN B-1) tablet 100 mg  100 mg Oral Daily Lindell Spar I, NP   100 mg at 06/05/18 0754  . traZODone (DESYREL) tablet  100 mg  100 mg Oral QHS PRN,MR X 1 [name], MD   100 mg at 06/03/18 2104    Lab Results: No results found for this or any previous visit (from the past 48 hour(s)).  Blood Alcohol level:  Lab Results  Component Value Date   ETH 265 (H) 06/01/2018   ETH 215 (H) 10/62/6948    Metabolic Disorder Labs: Lab Results  Component Value Date   HGBA1C 5.3 06/03/2018   MPG 105.41 06/03/2018   Lab Results  Component Value Date   PROLACTIN 13.2 10/25/2014   Lab Results  Component Value Date   CHOL 187 06/03/2018   TRIG 33 06/03/2018   HDL 101 06/03/2018   CHOLHDL 1.9 06/03/2018   VLDL 7 06/03/2018   LDLCALC 79 06/03/2018   LDLCALC 134 (H) 11/20/2014    Physical Findings: AIMS: Facial and Oral Movements Muscles of Facial Expression: None, normal Lips and Perioral Area: None, normal Jaw: None, normal Tongue: None, normal,Extremity Movements Upper (arms, wrists, hands, fingers): None, normal Lower (legs, knees, ankles, toes): None, normal, Trunk Movements Neck, shoulders, hips: None, normal, Overall Severity Severity of abnormal movements (highest score from questions above): None, normal Incapacitation due to abnormal movements: None, normal Patient's awareness of abnormal movements (rate only patient's report): No Awareness, Dental Status Current problems with teeth and/or dentures?: No Does patient usually wear dentures?: No  CIWA:  CIWA-Ar Total: 6 COWS:     Musculoskeletal: Strength & Muscle Tone: within normal limits Gait & Station:  normal Patient leans: N/A  Psychiatric Specialty Exam: Physical Exam  Constitutional: He is oriented to person, place, and time. He appears well-developed and well-nourished.  HENT:  Head: Normocephalic and atraumatic.  Respiratory: Effort normal.  Neurological: He is alert and oriented to person, place, and time.    ROS  Blood pressure (!) 144/100, pulse (!) 119, temperature 98.6 F (37 C), temperature source Oral,  resp. rate 20, height 5\' 11"  (1.803 m), weight 93 kg.Body mass index is 28.59 kg/m.  General Appearance: Casual  Eye Contact:  Fair  Speech:  Normal Rate  Volume:  Normal  Mood:  Anxious and Dysphoric  Affect:  Congruent  Thought Process:  Coherent and Descriptions of Associations: Intact  Orientation:  Full (Time, Place, and Person)  Thought Content:  Hallucinations: Auditory  Suicidal Thoughts:  No  Homicidal Thoughts:  No  Memory:  Immediate;   Fair Recent;   Fair Remote;   Fair  Judgement:  Intact  Insight:  Fair  Psychomotor Activity:  Increased  Concentration:  Concentration: Fair and Attention Span: Fair  Recall:  [employer] of Knowledge:  Fair  Language:  Fair  Akathisia:  Negative  Handed:  Right  AIMS (if indicated):     Assets:  Communication Skills Desire for Improvement Resilience  ADL's:  Intact  Cognition:  WNL  Sleep:  Number of Hours: 2.75     Treatment Plan Summary: Daily contact with patient to assess and evaluate symptoms and progress in treatment, Medication management and Plan : Patient is seen and examined.  Patient is a 47 year old male with the above-stated past psychiatric history who is seen in follow-up.  #1 schizoaffective disorder; bipolar type-continue Zoloft 75 mg p.o. daily, continue paliperidone cisterna with 156 mg to be given on 06/07/2018.  #2 alcohol use disorder-monitor for continued symptoms of withdrawal.  Recheck liver function enzymes tomorrow a.m.  #3 elevated liver function enzymes-as per above.  #4 gout exacerbation-continue allopurinol and colchicine.  #5 anxiety-continue Vistaril 50 mg p.o. every 6 hours as needed anxiety #5 EPS-continue Cogentin 1 mg p.o. twice daily as needed EPS #7 insomnia-continue trazodone 100 mg p.o. nightly as needed insomnia.  #8 disposition planning-in progress  Sharma Covert, MD 06/05/2018, 11:30 AM

## 2018-06-06 MED ORDER — IBUPROFEN 400 MG PO TABS
400.0000 mg | ORAL_TABLET | Freq: Four times a day (QID) | ORAL | Status: DC | PRN
  Administered 2018-06-06 – 2018-06-07 (×4): 400 mg via ORAL
  Filled 2018-06-06 (×4): qty 1

## 2018-06-06 MED ORDER — LORAZEPAM 1 MG PO TABS: 1.0000 mg | ORAL_TABLET | Freq: Four times a day (QID) | ORAL | Status: DC | PRN

## 2018-06-06 MED ORDER — LOPERAMIDE HCL 2 MG PO CAPS: 2.0000 mg | ORAL_CAPSULE | ORAL | Status: DC | PRN

## 2018-06-06 MED ORDER — METOPROLOL TARTRATE 25 MG PO TABS
12.5000 mg | ORAL_TABLET | Freq: Two times a day (BID) | ORAL | Status: DC
  Administered 2018-06-06 – 2018-06-08 (×4): 12.5 mg via ORAL
  Filled 2018-06-06 (×6): qty 1

## 2018-06-06 MED ORDER — ONDANSETRON 4 MG PO TBDP: 4.0000 mg | ORAL_TABLET | Freq: Four times a day (QID) | ORAL | Status: DC | PRN

## 2018-06-06 NOTE — Progress Notes (Signed)
[facility] MD Progress Note  06/06/2018 11:14 AM [name]  MRN:  [id number] Subjective:  [name] is a 47 y/o M with history of schizoaffective disorder bipolar type and PTSD who was admitted voluntarily from [facility] where he presented with worsening depression, SI, poor sleep, AH, VH, worsening alcohol use, and poor adherence to his outpatient treatment regimen. He was started on alcohol withdrawal protocol. He was medically cleared and then transferred to [facility] for additional treatment and stabilization.He was resumed on zoloft, and started on trial of Invega with potential plan of transitioning to long-acting injectable form.   Objective: Patient is seen and examined.  Patient is a 47 year old male with a past psychiatric history significant for schizoaffective disorder; bipolar type, PTSD, and alcohol use disorder.  He is seen in follow-up.  He continues to slowly improved.  He stated that the auditory hallucinations continue to decrease, but that they are still somewhat present.  He has difficulty understanding them.  He denied any suicidal or homicidal ideation.  He denied any side effects to his current medications.  Unfortunately I forgot to put the ibuprofen in for pain yesterday, and I have taking care of that today.  With regard to his laboratories, his liver function enzymes have dropped significantly.  That was good to know.  He slept 5 hours last night.  His CIWA was only 3 today.  His blood pressure remains elevated at 144/100, and he remains tachycardic with a rate this morning of 119.  He is not on any antihypertensive medications.  As stated above, his liver function enzymes have dropped significantly.  His AST was 114, his ALT was 174.  His CBC was normal except for slightly decreased platelets at 120,000. Principal Problem: Schizoaffective disorder, bipolar type ([place]) Diagnosis:   Patient Active Problem List   Diagnosis Date Noted  . Schizoaffective disorder, bipolar type ([place])  [F25.0] 06/02/2018  . Low back pain [M54.5] 08/21/2016  . Alcohol dependence with uncomplicated withdrawal ([place]) [F10.230] 02/22/2016  . Alcohol-induced mood disorder ([place]) [F10.94] 02/22/2016  . Gout [M10.9] 10/22/2015  . GERD (gastroesophageal reflux disease) [K21.9] 10/22/2015  . Alcohol withdrawal seizure with complication ([place]) [C37.628, R56.9] 09/30/2015  . Schizoaffective disorder, depressive type ([place]) [F25.1]   . History of alcohol abuse [F10.11]   . Tobacco dependence due to cigarettes [F17.210] 11/20/2014  . Post traumatic stress disorder (PTSD) [F43.10] 11/03/2011   Total Time spent with patient: 15 minutes  Past Psychiatric History: See admission H&P  Past Medical History:  Past Medical History:  Diagnosis Date  . Alcohol abuse   . Anxiety    at age 60  . Depression    at age 62  . Psychosis ([place])   . PTSD (post-traumatic stress disorder)   . Schizoaffective disorder   . Seizure disorder ([place])    related to etoh seizure    Past Surgical History:  Procedure Laterality Date  . FOOT SURGERY     right  . HAND SURGERY     Family History:  Family History  Problem Relation Age of Onset  . Alcoholism Mother   . CAD Father   . Depression Brother    Family Psychiatric  History: See admission H&P Social History:  Social History   Substance and Sexual Activity  Alcohol Use Yes  . Alcohol/week: 0.0 standard drinks  . Frequency: Never   Comment: drinking daily      Social History   Substance and Sexual Activity  Drug Use Yes  . Frequency: 1.0  times per week  . Types: Marijuana   Comment: THC 2 to 3 times per month. last use 09/24/2015    Social History   Socioeconomic History  . Marital status: Divorced    Spouse name: Not on file  . Number of children: Not on file  . Years of education: Not on file  . Highest education level: Not on file  Occupational History  . Not on file  Social Needs  . Financial resource strain: Not on file  . Food  insecurity:    Worry: Not on file    Inability: Not on file  . Transportation needs:    Medical: Not on file    Non-medical: Not on file  Tobacco Use  . Smoking status: Current Every Day Smoker    Packs/day: 1.00    Years: 24.00    Pack years: 24.00    Types: Cigarettes  . Smokeless tobacco: Never Used  Substance and Sexual Activity  . Alcohol use: Yes    Alcohol/week: 0.0 standard drinks    Frequency: Never    Comment: drinking daily   . Drug use: Yes    Frequency: 1.0 times per week    Types: Marijuana    Comment: THC 2 to 3 times per month. last use 09/24/2015  . Sexual activity: Yes    Birth control/protection: Condom  Lifestyle  . Physical activity:    Days per week: Not on file    Minutes per session: Not on file  . Stress: Not on file  Relationships  . Social connections:    Talks on phone: Not on file    Gets together: Not on file    Attends religious service: Not on file    Active member of club or organization: Not on file    Attends meetings of clubs or organizations: Not on file    Relationship status: Not on file  Other Topics Concern  . Not on file  Social History Narrative  . Not on file   Additional Social History:                         Sleep: Fair  Appetite:  Fair  Current Medications: Current Facility-Administered Medications  Medication Dose Route Frequency Provider Last Rate Last Dose  . allopurinol (ZYLOPRIM) tablet 100 mg  100 mg Oral Daily [name], MD   100 mg at 06/06/18 0744  . alum & mag hydroxide-simeth (MAALOX/MYLANTA) 200-200-20 MG/5ML suspension 30 mL  30 mL Oral Q4H PRN Suella Broad, FNP   30 mL at 06/03/18 0927  . benztropine (COGENTIN) tablet 1 mg  1 mg Oral BID PRN [name], MD       Or  . benztropine mesylate (COGENTIN) injection 1 mg  1 mg Intramuscular BID PRN [name], MD      . colchicine tablet 0.6 mg  0.6 mg Oral Daily [name], MD    0.6 mg at 06/06/18 0745  . feeding supplement (ENSURE ENLIVE) (ENSURE ENLIVE) liquid 237 mL  237 mL Oral BID BM [place], Christopher T, MD      . hydrOXYzine (ATARAX/VISTARIL) tablet 50 mg  50 mg Oral Q6H PRN [name], MD      . ibuprofen (ADVIL,MOTRIN) tablet 400 mg  400 mg Oral Q6H PRN Sharma Covert, MD   400 mg at 06/06/18 1047  . loperamide (IMODIUM) capsule 2-4 mg  2-4 mg Oral PRN Myles Lipps  Kellie Simmering, MD      . LORazepam (ATIVAN) tablet 1 mg  1 mg Oral Q6H PRN Sharma Covert, MD      . magnesium hydroxide (MILK OF MAGNESIA) suspension 30 mL  30 mL Oral Daily PRN Suella Broad, FNP      . multivitamin with minerals tablet 1 tablet  1 tablet Oral Daily Lindell Spar I, NP   1 tablet at 06/06/18 0744  . nicotine (NICODERM CQ - dosed in mg/24 hours) patch 21 mg  21 mg Transdermal Daily Suella Broad, FNP   21 mg at 06/06/18 0745  . ondansetron (ZOFRAN-ODT) disintegrating tablet 4 mg  4 mg Oral Q6H PRN Sharma Covert, MD      . Derrill Memo ON 06/07/2018] paliperidone (INVEGA SUSTENNA) injection 156 mg  156 mg Intramuscular Q30 days Maris Berger T, MD      . pantoprazole (PROTONIX) EC tablet 20 mg  20 mg Oral BID [name], MD   20 mg at 06/06/18 0745  . risperiDONE (RISPERDAL M-TABS) disintegrating tablet 2 mg  2 mg Oral Q6H PRN [name], MD   2 mg at 06/03/18 2302   Or  . ziprasidone (GEODON) injection 20 mg  20 mg Intramuscular Q12H PRN [name], MD      . sertraline (ZOLOFT) tablet 75 mg  75 mg Oral Daily Suella Broad, FNP   75 mg at 06/06/18 0744  . thiamine (VITAMIN B-1) tablet 100 mg  100 mg Oral Daily Lindell Spar I, NP   100 mg at 06/06/18 0745  . traZODone (DESYREL) tablet 100 mg  100 mg Oral QHS PRN,MR X 1 [name], MD   100 mg at 06/03/18 2104    Lab Results:  Results for orders placed or performed during the hospital encounter of 06/02/18 (from the past 48  hour(s))  Hepatic function panel     Status: Abnormal   Collection Time: 06/05/18  7:08 PM  Result Value Ref Range   Total Protein 6.7 6.5 - 8.1 g/dL   Albumin 3.8 3.5 - 5.0 g/dL   AST 114 (H) 15 - 41 U/L   ALT 174 (H) 0 - 44 U/L   Alkaline Phosphatase 84 38 - 126 U/L   Total Bilirubin 1.0 0.3 - 1.2 mg/dL   Bilirubin, Direct 0.3 (H) 0.0 - 0.2 mg/dL   Indirect Bilirubin 0.7 0.3 - 0.9 mg/dL    Comment: Performed at [facility], [place] [address], [place], [place] [address]    Blood Alcohol level:  Lab Results  Component Value Date   ETH 265 (H) 06/01/2018   ETH 215 (H) 54/62/7035    Metabolic Disorder Labs: Lab Results  Component Value Date   HGBA1C 5.3 06/03/2018   MPG 105.41 06/03/2018   Lab Results  Component Value Date   PROLACTIN 13.2 10/25/2014   Lab Results  Component Value Date   CHOL 187 06/03/2018   TRIG 33 06/03/2018   HDL 101 06/03/2018   CHOLHDL 1.9 06/03/2018   VLDL 7 06/03/2018   LDLCALC 79 06/03/2018   LDLCALC 134 (H) 11/20/2014    Physical Findings: AIMS: Facial and Oral Movements Muscles of Facial Expression: None, normal Lips and Perioral Area: None, normal Jaw: None, normal Tongue: None, normal,Extremity Movements Upper (arms, wrists, hands, fingers): None, normal Lower (legs, knees, ankles, toes): None, normal, Trunk Movements Neck, shoulders, hips: None, normal, Overall Severity Severity of abnormal movements (highest score from questions above): None,  normal Incapacitation due to abnormal movements: None, normal Patient's awareness of abnormal movements (rate only patient's report): No Awareness, Dental Status Current problems with teeth and/or dentures?: No Does patient usually wear dentures?: No  CIWA:  CIWA-Ar Total: 3 COWS:     Musculoskeletal: Strength & Muscle Tone: within normal limits Gait & Station: Limping Patient leans: N/A  Psychiatric Specialty Exam: Physical Exam  Nursing note and vitals  reviewed. Constitutional: He is oriented to person, place, and time. He appears well-developed and well-nourished.  HENT:  Head: Normocephalic and atraumatic.  Respiratory: Effort normal.  Neurological: He is alert and oriented to person, place, and time.    ROS  Blood pressure (!) 144/100, pulse (!) 119, temperature 98.6 F (37 C), temperature source Oral, resp. rate 20, height 5\' 11"  (1.803 m), weight 93 kg.Body mass index is 28.59 kg/m.  General Appearance: Casual  Eye Contact:  Fair  Speech:  Normal Rate  Volume:  Normal  Mood:  Anxious and Depressed  Affect:  Congruent  Thought Process:  Coherent and Descriptions of Associations: Intact  Orientation:  Full (Time, Place, and Person)  Thought Content:  Hallucinations: Auditory  Suicidal Thoughts:  No  Homicidal Thoughts:  No  Memory:  Immediate;   Fair Recent;   Fair Remote;   Fair  Judgement:  Intact  Insight:  Fair  Psychomotor Activity:  Normal  Concentration:  Concentration: Fair and Attention Span: Fair  Recall:  [employer] of Knowledge:  Fair  Language:  Fair  Akathisia:  Negative  Handed:  Right  AIMS (if indicated):     Assets:  Communication Skills Desire for Improvement Physical Health Resilience  ADL's:  Intact  Cognition:  WNL  Sleep:  Number of Hours: 5     Treatment Plan Summary: Daily contact with patient to assess and evaluate symptoms and progress in treatment, Medication management and Plan : Patient is seen and examined.  Patient is a 47 year old male with the above-stated past psychiatric history who is seen in follow-up.  #1 schizoaffective disorder; bipolar type-continue Zoloft 75 mg p.o. daily, continue paliperidone long-acting with a 156 mg injection to be given on 06/07/2018.  #2 alcohol use disorder-continue to monitor for withdrawal symptoms.  Liver function enzymes are significantly improved.  #3 abnormal liver function enzymes-still elevated, but significantly improved.  #4 gout  exacerbation-continue allopurinol, colchicine and ibuprofen.  #5 anxiety-continue Vistaril 50 mg every 6 hours as needed anxiety.  #5 EPS-continue Cogentin 1 mg p.o. twice daily as needed EPS symptoms.  #7 insomnia-continue trazodone 100 mg p.o. nightly as needed.  #8 disposition planning-in progress.  Sharma Covert, MD 06/06/2018, 11:14 AM

## 2018-06-06 NOTE — Progress Notes (Signed)
Adult Psychoeducational Group Note  Date:  06/06/2018 Time:  8:57 PM  Group Topic/Focus:  Wrap-Up Group:   The focus of this group is to help patients review their daily goal of treatment and discuss progress on daily workbooks.  Participation Level:  Active  Participation Quality:  Appropriate  Affect:  Appropriate  Cognitive:  Appropriate  Insight: Appropriate  Engagement in Group:  Engaged  Modes of Intervention:  Discussion  Additional Comments:  The patient expressed that he attend group.The patient also said that he attended group.  Nash Shearer 06/06/2018, 8:57 PM

## 2018-06-06 NOTE — Progress Notes (Signed)
Patient ID: [name], male   DOB: September 09, 1970, 47 y.o.   MRN: [id number]  D: Pt reports a good sleep quality last night, reports a good appetite, and reports a good concentration level and a normal energy level.  Pt rates his depression today as 3 (10 being the worst), rates his hopelessness level as 2 (10 being the worst), and rates his anxiety level as 7 (10 being the worst). Pt reports pain of 2/10 in b/l feet related to his gout, and is being medicated with Ibuprofen 400mg  as needed.  Pt reports that what is important for him today is to go to group and not go to bed early, and to attend groups.  A: Pt being monitored on Q15 minute checks for safety, all meds being given as ordered.    R: Will continue to monitor and will address any concerns as they may arise.

## 2018-06-06 NOTE — Progress Notes (Signed)
D: Pt denies SI/HI, passive-AVH -getting better. Pt is pleasant and cooperative. Pt stated he was doing better. Pt visible on the unit interacting with peers at times.   A: Pt was offered support and encouragement. Pt was given scheduled medications. Pt was encourage to attend groups. Q 15 minute checks were done for safety.  R:Pt attends groups and interacts well with peers and staff. Pt is taking medication. Pt has no complaints.Pt receptive to treatment and safety maintained on unit.  Problem: Education: Goal: Emotional status will improve Outcome: Progressing Note:  Pt stated he was doing good   Problem: Activity: Goal: Sleeping patterns will improve Outcome: Progressing   Problem: Safety: Goal: Periods of time without injury will increase Outcome: Progressing Note:  Pt has been safe on the unit this evening   Problem: Coping: Goal: Coping ability will improve Outcome: Progressing Note:  Pt denies SI at this time

## 2018-06-06 NOTE — Progress Notes (Signed)
Patient ID: [name], male   DOB: Dec 04, 1970, 47 y.o.   MRN: [id number] PER STATE REGULATIONS 482.30  THIS CHART WAS REVIEWED FOR MEDICAL NECESSITY WITH RESPECT TO THE PATIENT'S ADMISSION/DURATION OF STAY.  NEXT REVIEW DATE:06/10/18  Roma Schanz, RN, BSN CASE MANAGER

## 2018-06-06 NOTE — BHH Group Notes (Signed)
[facility] LCSW Group Therapy Note  Date/Time:  06/06/2018  11:00AM-12:00PM  Type of Therapy and Topic:  Group Therapy:  Music and Mood  Participation Level:  Active   Description of Group: In this process group, members listened to a variety of genres of music and identified that different types of music evoke different responses.  Patients were encouraged to identify music that was soothing for them and music that was energizing for them.  Patients discussed how this knowledge can help with wellness and recovery in various ways including managing depression and anxiety as well as encouraging healthy sleep habits.    Therapeutic Goals: 1. Patients will explore the impact of different varieties of music on mood 2. Patients will verbalize the thoughts they have when listening to different types of music 3. Patients will identify music that is soothing to them as well as music that is energizing to them 4. Patients will discuss how to use this knowledge to assist in maintaining wellness and recovery 5. Patients will explore the use of music as a coping skill  Summary of Patient Progress:  At the beginning of group, patient expressed that he felt relieved at getting good news about his health.  At the end of group he said he felt calm and centered.  Therapeutic Modalities: Solution Focused Brief Therapy Activity   Selmer Dominion, LCSW

## 2018-06-07 DIAGNOSIS — K219 Gastro-esophageal reflux disease without esophagitis: Secondary | ICD-10-CM

## 2018-06-07 DIAGNOSIS — F10239 Alcohol dependence with withdrawal, unspecified: Secondary | ICD-10-CM

## 2018-06-07 DIAGNOSIS — R451 Restlessness and agitation: Secondary | ICD-10-CM

## 2018-06-07 NOTE — Progress Notes (Signed)
D: Pt denies SI/HI/AVH. Pt is pleasant and cooperative. Pt been visible on the unit, pt stated he was doing good and ready to D/C A: Pt was offered support and encouragement. Pt was given scheduled medications. Pt was encourage to attend groups. Q 15 minute checks were done for safety.  R:Pt attends groups and interacts well with peers and staff. Pt is taking medication. Pt has no complaints.Pt receptive to treatment and safety maintained on unit.  Problem: Education: Goal: Emotional status will improve Outcome: Progressing   Problem: Education: Goal: Mental status will improve Outcome: Progressing   Problem: Activity: Goal: Interest or engagement in activities will improve Outcome: Progressing   Problem: Coping: Goal: Ability to demonstrate self-control will improve Outcome: Progressing   Problem: Physical Regulation: Goal: Ability to maintain clinical measurements within normal limits will improve Outcome: Progressing   Problem: Safety: Goal: Periods of time without injury will increase Outcome: Progressing   Problem: Coping: Goal: Coping ability will improve Outcome: Progressing   Problem: Health Behavior/Discharge Planning: Goal: Compliance with therapeutic regimen will improve Outcome: Progressing

## 2018-06-07 NOTE — Plan of Care (Signed)
  Problem: Activity: Goal: Interest or engagement in activities will improve Outcome: Progressing   Problem: Safety: Goal: Periods of time without injury will increase Outcome: Progressing  DAR NOTE: Patient presents with anxious affect and depressed mood.  Denies pain, auditory and visual hallucinations.  Described energy level as normal and concentration as good.  Rates depression at 3, hopelessness at 2, and anxiety at 7.  Reports symptoms of tremors, runny nose on self inventory form.  Maintained on routine safety checks.  Medications given as prescribed.  Support and encouragement offered as needed.  Attended group and participated.  States goal for today is "to find out when I'm going home."  Patient observed socializing with peers in the dayroom.  Motrin 600 mg given for complain of leg pain with good effect.

## 2018-06-07 NOTE — BHH Group Notes (Signed)
[place] LCSW Group Therapy Note  Date/Time: 06/07/18, 1315  Type of Therapy and Topic:  Group Therapy:  Overcoming Obstacles  Participation Level:  active  Description of Group:    In this group patients will be encouraged to explore what they see as obstacles to their own wellness and recovery. They will be guided to discuss their thoughts, feelings, and behaviors related to these obstacles. The group will process together ways to cope with barriers, with attention given to specific choices patients can make. Each patient will be challenged to identify changes they are motivated to make in order to overcome their obstacles. This group will be process-oriented, with patients participating in exploration of their own experiences as well as giving and receiving support and challenge from other group members.  Therapeutic Goals: 1. Patient will identify personal and current obstacles as they relate to admission. 2. Patient will identify barriers that currently interfere with their wellness or overcoming obstacles.  3. Patient will identify feelings, thought process and behaviors related to these barriers. 4. Patient will identify two changes they are willing to make to overcome these obstacles:    Summary of Patient Progress: Pt shared that mental health and fear are obstacles in his life.  Pt showed some insight in talking about the need for him to take his medication to deal with his mental health problems and also shared about fears/anxieties about wanting to go back to work.      Therapeutic Modalities:   Cognitive Behavioral Therapy Solution Focused Therapy Motivational Interviewing Relapse Prevention Therapy  Lurline Idol, LCSW

## 2018-06-07 NOTE — Progress Notes (Signed)
Recreation Therapy Notes  Date: 10.21.19 Time: 1000 Location: [address] Dayroom  Group Topic: Coping Skills  Goal Area(s) Addresses:  Patient will be able to identify positive coping skills. Patient will be able to identify benefit of using coping skills post d/c.  Behavioral Response: Engaged  Intervention: Worksheet, pencils, white board, marker, eraser  Activity: Mind map.  LRT and patients filled in the first 8 boxes together with things (anger, death/loss, break up, family, anxiety, depression, fear and illness) that would require the use of coping skills.  Education: Radiographer, therapeutic, Dentist.   Education Outcome: Acknowledges understanding/In group clarification offered/Needs additional education.   Clinical Observations/Feedback: Pt was engaged and bright during activity.  Pt stated some of his coping skills were asking why you're angry, cry, do something fun, do some soul searching, play an instrument, video games, talk to somebody, don't be alone, face your fears, don't be ashamed of your illness and walk away when needed.     Adylynn Hertenstein [place], LRT/CTRS         Victorino Sparrow A 06/07/2018 11:51 AM

## 2018-06-07 NOTE — Progress Notes (Signed)
Adult Psychoeducational Group Note  Date:  06/07/2018 Time:  9:19 PM  Group Topic/Focus:  Wrap-Up Group:   The focus of this group is to help patients review their daily goal of treatment and discuss progress on daily workbooks.  Participation Level:  Minimal  Participation Quality:  Appropriate  Affect:  Appropriate  Cognitive:  Oriented  Insight: Appropriate  Engagement in Group:  Engaged  Modes of Intervention:  Socialization and Support  Additional Comments:  Patient attended and participated in group tonight. He reports having a good day. He is hoping to go home tomorrow or the day after. Today he went for group, which was the best part of his day. He went for meals and spoke with his doctor.  Salley Scarlet [facility] 06/07/2018, 9:19 PM

## 2018-06-07 NOTE — Progress Notes (Signed)
[facility] MD Progress Note  06/07/2018 2:22 PM [name]  MRN:  [id number]  Subjective: [name] reports, "I'm doing well. I stopped my medicines for 3 months because they were making my brain slow down. Then, I started to self-medicate with alcohol. That worsened my symptoms.  I'm doing better now since starting my medicines & my brain is active again. I have not heard any voices today, no visual hallucinations in the last 2 days. I was told that I'm getting another shot today. I am doing well now. I think I'm ready to go home".  [name] is a 47 y/o M with history of schizoaffective disorder bipolar type and PTSD who was admitted voluntarily from [facility] where he presented with worsening depression, SI, poor sleep, AH, VH, worsening alcohol use, and poor adherence to his outpatient treatment regimen. He was started on alcohol withdrawal protocol. He was medically cleared and then transferred to [facility] for additional treatment and stabilization. He was resumed on zoloft, and started on trial of Invega with potential plan of transitioning to long-acting injectable form.   [name] is seen, chart reviewed. The chart findings discussed with the treatment team. He presents alert & oriented. He is visible on the unit, attending group sessions. He says he is doing well. He denies any alcohol withdrawal symptoms.  He says he is sleeping well. He denies any AVH, delusional thoughts or paranoia. He denies SI/HI. He is tolerating his medications well. He will receive his 2nd booster of his antipsychotic injectable today. He thinks he is ready to be discharged to his home. He was in agreement with the above plan, and he had no further questions, comments, or concerns.   Principal Problem: Schizoaffective disorder, bipolar type ([place])  Diagnosis:   Patient Active Problem List   Diagnosis Date Noted  . Schizoaffective disorder, bipolar type ([place]) [F25.0] 06/02/2018  . Low back pain [M54.5] 08/21/2016  . Alcohol  dependence with uncomplicated withdrawal ([place]) [F10.230] 02/22/2016  . Alcohol-induced mood disorder ([place]) [F10.94] 02/22/2016  . Gout [M10.9] 10/22/2015  . GERD (gastroesophageal reflux disease) [K21.9] 10/22/2015  . Alcohol withdrawal seizure with complication ([place]) [W09.811, R56.9] 09/30/2015  . Schizoaffective disorder, depressive type ([place]) [F25.1]   . History of alcohol abuse [F10.11]   . Tobacco dependence due to cigarettes [F17.210] 11/20/2014  . Post traumatic stress disorder (PTSD) [F43.10] 11/03/2011   Total Time spent with patient: 15 minutes  Past Psychiatric History: See H&P  Past Medical History:  Past Medical History:  Diagnosis Date  . Alcohol abuse   . Anxiety    at age 10  . Depression    at age 68  . Psychosis ([place])   . PTSD (post-traumatic stress disorder)   . Schizoaffective disorder   . Seizure disorder ([place])    related to etoh seizure    Past Surgical History:  Procedure Laterality Date  . FOOT SURGERY     right  . HAND SURGERY     Family History:  Family History  Problem Relation Age of Onset  . Alcoholism Mother   . CAD Father   . Depression Brother    Family Psychiatric  History: See H&P  Social History:  Social History   Substance and Sexual Activity  Alcohol Use Yes  . Alcohol/week: 0.0 standard drinks  . Frequency: Never   Comment: drinking daily      Social History   Substance and Sexual Activity  Drug Use Yes  . Frequency: 1.0 times per week  .  Types: Marijuana   Comment: THC 2 to 3 times per month. last use 09/24/2015    Social History   Socioeconomic History  . Marital status: Divorced    Spouse name: Not on file  . Number of children: Not on file  . Years of education: Not on file  . Highest education level: Not on file  Occupational History  . Not on file  Social Needs  . Financial resource strain: Not on file  . Food insecurity:    Worry: Not on file    Inability: Not on file  . Transportation needs:     Medical: Not on file    Non-medical: Not on file  Tobacco Use  . Smoking status: Current Every Day Smoker    Packs/day: 1.00    Years: 24.00    Pack years: 24.00    Types: Cigarettes  . Smokeless tobacco: Never Used  Substance and Sexual Activity  . Alcohol use: Yes    Alcohol/week: 0.0 standard drinks    Frequency: Never    Comment: drinking daily   . Drug use: Yes    Frequency: 1.0 times per week    Types: Marijuana    Comment: THC 2 to 3 times per month. last use 09/24/2015  . Sexual activity: Yes    Birth control/protection: Condom  Lifestyle  . Physical activity:    Days per week: Not on file    Minutes per session: Not on file  . Stress: Not on file  Relationships  . Social connections:    Talks on phone: Not on file    Gets together: Not on file    Attends religious service: Not on file    Active member of club or organization: Not on file    Attends meetings of clubs or organizations: Not on file    Relationship status: Not on file  Other Topics Concern  . Not on file  Social History Narrative  . Not on file   Additional Social History:   Sleep: Good  Appetite:  Good  Current Medications: Current Facility-Administered Medications  Medication Dose Route Frequency Provider Last Rate Last Dose  . allopurinol (ZYLOPRIM) tablet 100 mg  100 mg Oral Daily [name], MD   100 mg at 06/07/18 0759  . alum & mag hydroxide-simeth (MAALOX/MYLANTA) 200-200-20 MG/5ML suspension 30 mL  30 mL Oral Q4H PRN Suella Broad, FNP   30 mL at 06/03/18 0927  . benztropine (COGENTIN) tablet 1 mg  1 mg Oral BID PRN [name], MD       Or  . benztropine mesylate (COGENTIN) injection 1 mg  1 mg Intramuscular BID PRN [name], MD      . colchicine tablet 0.6 mg  0.6 mg Oral Daily [name], MD   0.6 mg at 06/07/18 0759  . feeding supplement (ENSURE ENLIVE) (ENSURE ENLIVE) liquid 237 mL  237 mL Oral BID BM [place],  Christopher T, MD      . hydrOXYzine (ATARAX/VISTARIL) tablet 50 mg  50 mg Oral Q6H PRN [name], MD      . ibuprofen (ADVIL,MOTRIN) tablet 400 mg  400 mg Oral Q6H PRN Sharma Covert, MD   400 mg at 06/07/18 1309  . loperamide (IMODIUM) capsule 2-4 mg  2-4 mg Oral PRN Sharma Covert, MD      . LORazepam (ATIVAN) tablet 1 mg  1 mg Oral Q6H PRN Sharma Covert, MD      .  magnesium hydroxide (MILK OF MAGNESIA) suspension 30 mL  30 mL Oral Daily PRN Suella Broad, FNP      . metoprolol tartrate (LOPRESSOR) tablet 12.5 mg  12.5 mg Oral BID Sharma Covert, MD   12.5 mg at 06/07/18 0759  . multivitamin with minerals tablet 1 tablet  1 tablet Oral Daily Lindell Spar I, NP   1 tablet at 06/07/18 0759  . nicotine (NICODERM CQ - dosed in mg/24 hours) patch 21 mg  21 mg Transdermal Daily Suella Broad, FNP   21 mg at 06/07/18 0801  . ondansetron (ZOFRAN-ODT) disintegrating tablet 4 mg  4 mg Oral Q6H PRN Sharma Covert, MD      . paliperidone [facility] SUSTENNA) injection 156 mg  156 mg Intramuscular Q30 days [name], MD   156 mg at 06/07/18 1315  . pantoprazole (PROTONIX) EC tablet 20 mg  20 mg Oral BID [name], MD   20 mg at 06/07/18 0759  . risperiDONE (RISPERDAL M-TABS) disintegrating tablet 2 mg  2 mg Oral Q6H PRN [name], MD   2 mg at 06/03/18 2302   Or  . ziprasidone (GEODON) injection 20 mg  20 mg Intramuscular Q12H PRN [name], MD      . sertraline (ZOLOFT) tablet 75 mg  75 mg Oral Daily Suella Broad, FNP   75 mg at 06/07/18 0759  . thiamine (VITAMIN B-1) tablet 100 mg  100 mg Oral Daily Djon Tith I, NP   100 mg at 06/07/18 1309  . traZODone (DESYREL) tablet 100 mg  100 mg Oral QHS PRN,MR X 1 [name], MD   100 mg at 06/06/18 2140   Lab Results:  Results for orders placed or performed during the [facility] encounter of 06/02/18 (from the past 48 hour(s))   Hepatic function panel     Status: Abnormal   Collection Time: 06/05/18  7:08 PM  Result Value Ref Range   Total Protein 6.7 6.5 - 8.1 g/dL   Albumin 3.8 3.5 - 5.0 g/dL   AST 114 (H) 15 - 41 U/L   ALT 174 (H) 0 - 44 U/L   Alkaline Phosphatase 84 38 - 126 U/L   Total Bilirubin 1.0 0.3 - 1.2 mg/dL   Bilirubin, Direct 0.3 (H) 0.0 - 0.2 mg/dL   Indirect Bilirubin 0.7 0.3 - 0.9 mg/dL    Comment: Performed at [facility], [place] [address], [place], [place] [address]    Blood Alcohol level:  Lab Results  Component Value Date   ETH 265 (H) 06/01/2018   ETH 215 (H) 29/79/8921   Metabolic Disorder Labs: Lab Results  Component Value Date   HGBA1C 5.3 06/03/2018   MPG 105.41 06/03/2018   Lab Results  Component Value Date   PROLACTIN 13.2 10/25/2014   Lab Results  Component Value Date   CHOL 187 06/03/2018   TRIG 33 06/03/2018   HDL 101 06/03/2018   CHOLHDL 1.9 06/03/2018   VLDL 7 06/03/2018   LDLCALC 79 06/03/2018   LDLCALC 134 (H) 11/20/2014    Physical Findings: AIMS: Facial and Oral Movements Muscles of Facial Expression: None, normal Lips and Perioral Area: None, normal Jaw: None, normal Tongue: None, normal,Extremity Movements Upper (arms, wrists, hands, fingers): None, normal Lower (legs, knees, ankles, toes): None, normal, Trunk Movements Neck, shoulders, hips: None, normal, Overall Severity Severity of abnormal movements (highest score from questions above): None, normal Incapacitation due to abnormal movements: None,  normal Patient's awareness of abnormal movements (rate only patient's report): No Awareness, Dental Status Current problems with teeth and/or dentures?: No Does patient usually wear dentures?: No  CIWA:  CIWA-Ar Total: 1 COWS:     Musculoskeletal: Strength & Muscle Tone: within normal limits Gait & Station: normal Patient leans: N/A  Psychiatric Specialty Exam: Physical Exam  Nursing note and vitals reviewed.   Review of  Systems  Constitutional: Negative for chills and fever.  Respiratory: Negative for cough and shortness of breath.   Cardiovascular: Negative for chest pain.  Gastrointestinal: Negative for abdominal pain, heartburn, nausea and vomiting.  Psychiatric/Behavioral: Positive for depression ("Improving"), hallucinations ("Imrpoving") and substance abuse (Hx. alcohol use disorder). Negative for suicidal ideas. The patient has insomnia ("Improving"). The patient is not nervous/anxious.     Blood pressure (!) 150/100, pulse 61, temperature 99.1 F (37.3 C), temperature source Oral, resp. rate 20, height 5\' 11"  (1.803 m), weight 93 kg.Body mass index is 28.59 kg/m.  General Appearance: Casual and Fairly Groomed  Eye Contact:  Good  Speech:  Clear and Coherent and Normal Rate  Volume:  Normal  Mood:  Euthymic  Affect:  Blunt, Congruent and Constricted  Thought Process:  Coherent and Goal Directed  Orientation:  Full (Time, Place, and Person)  Thought Content:  Delusions and Hallucinations: Auditory Visual  Suicidal Thoughts:  No  Homicidal Thoughts:  No  Memory:  Immediate;   Fair Recent;   Fair Remote;   Fair  Judgement:  Poor  Insight:  Lacking  Psychomotor Activity:  Normal  Concentration:  Concentration: Fair  Recall:  [employer] of Knowledge:  Fair  Language:  Fair  Akathisia:  No  Handed:    AIMS (if indicated):     Assets:  Resilience Social Support  ADL's:  Intact  Cognition:  WNL  Sleep:  Number of Hours: 6   Treatment Plan Summary: Daily contact with patient to assess and evaluate symptoms and progress in treatment   -Continue inpatient hospitalization.  -Will continue today 06/07/2018 plan as below except where it is noted.  -Schizoaffective disorder, bipolar type and PTSD   -Continue Zoloft 75mg  po qDay   -DC'ed oral invega   -Invega Sustenna: Administered the 1st dose of Invega Sustenna 234 mg IM once on (06/04/18) followed by Kirt Boys 156mg  IM Q 30 days  starting 06/07/18.  -Gout flare prophylaxis  -Continue allopurinol 100mg  po qDay   -Continue colchicine 0.6mg  po qDay  -anxiety   -Continue vistaril 50mg  po q6h prn anxiety  -alcohol withdrawal   -Continue CIWA with ativan taper  -EPS  -Continue cogentin 1mg  po/IM BID prn EPS  -insomnia   -Continue trazodone 100mg  po qhs prn insomnia (may repeat x1 prn insomnia)  -GERD   -Continue protonix 20mg  po BID  -agitation     -Continue risperdal M-tab 2mg  po q6h prn agitation  -Continue geodon 20mg  IM q12h prn severe agitation  -Encourage participation in groups and therapeutic milieu  -disposition planning will be ongoing  Lindell Spar, NP, PMHNP, FNP-BC 06/07/2018, 2:22 PMPatient ID: [name], male   DOB: May 13, 1971, 47 y.o.   MRN: [id number]

## 2018-06-08 MED ORDER — SERTRALINE HCL 25 MG PO TABS: 75.0000 mg | ORAL_TABLET | Freq: Every day | ORAL | 1 refills | Status: DC

## 2018-06-08 MED ORDER — METOPROLOL TARTRATE 25 MG PO TABS: 12.5000 mg | ORAL_TABLET | Freq: Two times a day (BID) | ORAL | 0 refills | Status: DC

## 2018-06-08 MED ORDER — ALLOPURINOL 100 MG PO TABS: 100.0000 mg | ORAL_TABLET | Freq: Every day | ORAL | 0 refills | Status: DC

## 2018-06-08 MED ORDER — SERTRALINE HCL 25 MG PO TABS
75.0000 mg | ORAL_TABLET | Freq: Every day | ORAL | Status: DC
  Filled 2018-06-08 (×2): qty 21

## 2018-06-08 MED ORDER — TRAZODONE HCL 100 MG PO TABS: 100.0000 mg | ORAL_TABLET | Freq: Every evening | ORAL | 0 refills | Status: DC | PRN

## 2018-06-08 MED ORDER — COLCHICINE 0.6 MG PO TABS: 0.6000 mg | ORAL_TABLET | Freq: Every day | ORAL | 0 refills | Status: DC

## 2018-06-08 MED ORDER — PALIPERIDONE PALMITATE ER 156 MG/ML IM SUSY: 156.0000 mg | PREFILLED_SYRINGE | INTRAMUSCULAR | 0 refills | Status: DC

## 2018-06-08 NOTE — Progress Notes (Signed)
Recreation Therapy Notes  INPATIENT RECREATION TR PLAN  Patient Details Name: [name] MRN: [id number] DOB: Mar 25, 1971 Today's Date: 06/08/2018  Rec Therapy Plan Is patient appropriate for Therapeutic Recreation?: Yes Treatment times per week: about 3 days Estimated Length of Stay: 5-7 days TR Treatment/Interventions: Group participation (Comment)  Discharge Criteria Pt will be discharged from therapy if:: Discharged Treatment plan/goals/alternatives discussed and agreed upon by:: Patient/family  Discharge Summary Short term goals set: See patient care plan Short term goals met: Complete Progress toward goals comments: Groups attended Which groups?: Wellness, Stress management, Coping skills, Other (Comment)(Team building) Reason goals not met: None Therapeutic equipment acquired: N/A Reason patient discharged from therapy: Discharge from hospital Pt/family agrees with progress & goals achieved: Yes Date patient discharged from therapy: 06/08/18    Victorino Sparrow, LRT/CTRS  Ria Comment, Phu Record A 06/08/2018, 11:11 AM

## 2018-06-08 NOTE — BHH Suicide Risk Assessment (Signed)
[facility] Discharge Suicide Risk Assessment   Principal Problem: Schizoaffective disorder, bipolar type [facility]) Discharge Diagnoses:  Patient Active Problem List   Diagnosis Date Noted  . Schizoaffective disorder, bipolar type ([place]) [F25.0] 06/02/2018  . Low back pain [M54.5] 08/21/2016  . Alcohol dependence with uncomplicated withdrawal ([place]) [F10.230] 02/22/2016  . Alcohol-induced mood disorder ([place]) [F10.94] 02/22/2016  . Gout [M10.9] 10/22/2015  . GERD (gastroesophageal reflux disease) [K21.9] 10/22/2015  . Alcohol withdrawal seizure with complication ([place]) [B15.176, R56.9] 09/30/2015  . Schizoaffective disorder, depressive type ([place]) [F25.1]   . History of alcohol abuse [F10.11]   . Tobacco dependence due to cigarettes [F17.210] 11/20/2014  . Post traumatic stress disorder (PTSD) [F43.10] 11/03/2011   Total Time spent with patient: 30 minutes  Psychiatric Specialty Exam: Review of Systems  Constitutional: Negative for chills and fever.  Respiratory: Negative for cough and shortness of breath.   Cardiovascular: Negative for chest pain.  Gastrointestinal: Negative for abdominal pain, heartburn, nausea and vomiting.  Psychiatric/Behavioral: Negative for depression, hallucinations and suicidal ideas. The patient is not nervous/anxious and does not have insomnia.     Blood pressure (!) 131/98, pulse 94, temperature 97.8 F (36.6 C), temperature source Oral, resp. rate 16, height 5\' 11"  (1.803 m), weight 93 kg.Body mass index is 28.59 kg/m.   Mental Status Per Nursing Assessment::   On Admission:  Suicidal ideation indicated by patient, Self-harm thoughts  Demographic Factors:  Male, Caucasian, Low socioeconomic status, Living alone and Unemployed  Loss Factors: Financial problems/change in socioeconomic status  Historical Factors: Impulsivity  Risk Reduction Factors:   Positive social support, Positive therapeutic relationship and Positive coping skills or problem solving  skills  Continued Clinical Symptoms:  Depression:   Impulsivity Alcohol/Substance Abuse/Dependencies Schizophrenia:   Paranoid or undifferentiated type More than one psychiatric diagnosis  Cognitive Features That Contribute To Risk:  None    Suicide Risk:  Minimal: No identifiable suicidal ideation.  Patients presenting with no risk factors but with morbid ruminations; may be classified as minimal risk based on the severity of the depressive symptoms  Follow-up Information    [facility] Follow up on 06/10/2018.   Why:  Thursday at 8:00AM for your hospital follow up appointment with Earlie Server.  Bring your insurance card and your hospital d/c paperwork Contact information: [address] [place] [address] [phone]           Plan Of Care/Follow-up recommendations:  Activity:  as tolerated Diet:  normal Tests:  NA Other:  see above for [place], MD 06/08/2018, 9:10 AM

## 2018-06-08 NOTE — Discharge Summary (Signed)
Physician Discharge Summary Note  Patient:  [name] is an 47 y.o., male MRN:  [id number] DOB:  09-Jan-1971 Patient phone:  [phone] (home)  Patient address:   [address] [place] [address],  Total Time spent with patient: 30 minutes  Date of Admission:  06/02/2018 Date of Discharge: 06/08/2018  Reason for Admission:  Depression, SI, AH, VH, and alcohol use  Principal Problem: Schizoaffective disorder, bipolar type [facility]) Discharge Diagnoses: Patient Active Problem List   Diagnosis Date Noted  . Schizoaffective disorder, bipolar type ([place]) [F25.0] 06/02/2018  . Low back pain [M54.5] 08/21/2016  . Alcohol dependence with uncomplicated withdrawal ([place]) [F10.230] 02/22/2016  . Alcohol-induced mood disorder ([place]) [F10.94] 02/22/2016  . Gout [M10.9] 10/22/2015  . GERD (gastroesophageal reflux disease) [K21.9] 10/22/2015  . Alcohol withdrawal seizure with complication ([place]) [C37.628, R56.9] 09/30/2015  . Schizoaffective disorder, depressive type ([place]) [F25.1]   . History of alcohol abuse [F10.11]   . Tobacco dependence due to cigarettes [F17.210] 11/20/2014  . Post traumatic stress disorder (PTSD) [F43.10] 11/03/2011    Past Psychiatric History: see H&P  Past Medical History:  Past Medical History:  Diagnosis Date  . Alcohol abuse   . Anxiety    at age 8  . Depression    at age 47  . Psychosis ([place])   . PTSD (post-traumatic stress disorder)   . Schizoaffective disorder   . Seizure disorder ([place])    related to etoh seizure    Past Surgical History:  Procedure Laterality Date  . FOOT SURGERY     right  . HAND SURGERY     Family History:  Family History  Problem Relation Age of Onset  . Alcoholism Mother   . CAD Father   . Depression Brother    Family Psychiatric  History: see H&P Social History:  Social History   Substance and Sexual Activity  Alcohol Use Yes  . Alcohol/week: 0.0 standard drinks  . Frequency: Never   Comment: drinking  daily      Social History   Substance and Sexual Activity  Drug Use Yes  . Frequency: 1.0 times per week  . Types: Marijuana   Comment: THC 2 to 3 times per month. last use 09/24/2015    Social History   Socioeconomic History  . Marital status: Divorced    Spouse name: Not on file  . Number of children: Not on file  . Years of education: Not on file  . Highest education level: Not on file  Occupational History  . Not on file  Social Needs  . Financial resource strain: Not on file  . Food insecurity:    Worry: Not on file    Inability: Not on file  . Transportation needs:    Medical: Not on file    Non-medical: Not on file  Tobacco Use  . Smoking status: Current Every Day Smoker    Packs/day: 1.00    Years: 24.00    Pack years: 24.00    Types: Cigarettes  . Smokeless tobacco: Never Used  Substance and Sexual Activity  . Alcohol use: Yes    Alcohol/week: 0.0 standard drinks    Frequency: Never    Comment: drinking daily   . Drug use: Yes    Frequency: 1.0 times per week    Types: Marijuana    Comment: THC 2 to 3 times per month. last use 09/24/2015  . Sexual activity: Yes    Birth control/protection: Condom  Lifestyle  . Physical activity:  Days per week: Not on file    Minutes per session: Not on file  . Stress: Not on file  Relationships  . Social connections:    Talks on phone: Not on file    Gets together: Not on file    Attends religious service: Not on file    Active member of club or organization: Not on file    Attends meetings of clubs or organizations: Not on file    Relationship status: Not on file  Other Topics Concern  . Not on file  Social History Narrative  . Not on file    Hospital Course:    [name] is a 47 y/o M with history of schizoaffective disorder bipolar type and PTSD who was admitted voluntarily from [facility] where he presented with worsening depression, SI, poor sleep, AH, VH, worsening alcohol use, and poor adherence to  his outpatient treatment regimen. He was started on alcohol withdrawal protocol. He was medically cleared and then transferred to [facility] for additional treatment and stabilization. He was resumed on zoloft, and started on trial of Invega and transitioned to long-acting injectable form. He had improvement of his presenting symptoms.    Today upon evaluation, pt shares, "I'm doing good." He denies any specific concerns. He is sleeping well. His appetite is good. He denies other physical complaints. He denies SI/HI/AH/VH. He is tolerating his medications well including Kirt Boys, and he is in agreement to continue his current regimen without changes. He plans to follow up at [facility] for mental health outpatient treatment. He plans to attend AA meetings to help maintain sobriety. He was able to engage in safety planning including plan to return to [facility] or contact emergency services if he feels unable to maintain his own safety or the safety of others. Pt had no further questions, comments, or concerns.   The patient is at low risk of imminent suicide. Patient denied thoughts, intent, or plan for harm to self or others, expressed significant future orientation, and expressed an ability to mobilize assistance for his needs. he is presently void of any contributing psychiatric symptoms, cognitive difficulties, or substance use which would elevate his risk for lethality. Chronic risk for lethality is elevated in light of poor social support, poor compliance, homelessness. The chronic risk is presently mitigated by his ongoing desire and engagement in [facility] treatment and mobilization of support from family and friends. Chronic risk may elevate if he experiences any significant loss or worsening of symptoms, which can be managed and monitored through outpatient providers. At this time, acute risk for lethality is low and he is stable for ongoing outpatient management.   Modifiable risk factors were addressed during this  hospitalization through appropriate pharmacotherapy and establishment of outpatient follow-up treatment. Some risk factors for suicide are situational (i.e. Unstable housing) or related personality pathology (i.e. Poor coping mechanisms) and thus cannot be further mitigated by continued hospitalization in this setting.   Physical Findings: AIMS: Facial and Oral Movements Muscles of Facial Expression: None, normal Lips and Perioral Area: None, normal Jaw: None, normal Tongue: None, normal,Extremity Movements Upper (arms, wrists, hands, fingers): None, normal Lower (legs, knees, ankles, toes): None, normal, Trunk Movements Neck, shoulders, hips: None, normal, Overall Severity Severity of abnormal movements (highest score from questions above): None, normal Incapacitation due to abnormal movements: None, normal Patient's awareness of abnormal movements (rate only patient's report): No Awareness, Dental Status Current problems with teeth and/or dentures?: No Does patient usually wear dentures?: No  CIWA:  CIWA-Ar Total: 1 COWS:     Musculoskeletal: Strength & Muscle Tone: within normal limits Gait & Station: normal Patient leans: N/A  Psychiatric Specialty Exam: Physical Exam  Nursing note and vitals reviewed.   Review of Systems  Constitutional: Negative for chills and fever.  Respiratory: Negative for cough and shortness of breath.   Cardiovascular: Negative for chest pain.  Gastrointestinal: Negative for abdominal pain, heartburn, nausea and vomiting.  Psychiatric/Behavioral: Negative for depression, hallucinations and suicidal ideas. The patient is not nervous/anxious and does not have insomnia.     Blood pressure (!) 131/98, pulse 94, temperature 97.8 F (36.6 C), temperature source Oral, resp. rate 16, height 5\' 11"  (1.803 m), weight 93 kg.Body mass index is 28.59 kg/m.  General Appearance: Casual and Fairly Groomed  Eye Contact:  Good  Speech:  Clear and Coherent and Normal  Rate  Volume:  Normal  Mood:  Euthymic  Affect:  Congruent and Full Range  Thought Process:  Coherent and Goal Directed  Orientation:  Full (Time, Place, and Person)  Thought Content:  Logical  Suicidal Thoughts:  No  Homicidal Thoughts:  No  Memory:  Immediate;   Fair Recent;   Fair Remote;   Fair  Judgement:  Fair  Insight:  Lacking  Psychomotor Activity:  Normal  Concentration:  Concentration: Good  Recall:  Good  Fund of Knowledge:  Fair  Language:  Fair  Akathisia:  No  Handed:    AIMS (if indicated):     Assets:  Resilience Social Support  ADL's:  Intact  Cognition:  WNL  Sleep:  Number of Hours: 5.75     Have you used any form of tobacco in the last 30 days? (Cigarettes, Smokeless Tobacco, Cigars, and/or Pipes): Yes  Has this patient used any form of tobacco in the last 30 days? (Cigarettes, Smokeless Tobacco, Cigars, and/or Pipes) Yes, Yes, A prescription for an FDA-approved tobacco cessation medication was offered at discharge and the patient refused  Blood Alcohol level:  Lab Results  Component Value Date   ETH 265 (H) 06/01/2018   ETH 215 (H) 54/04/8118    Metabolic Disorder Labs:  Lab Results  Component Value Date   HGBA1C 5.3 06/03/2018   MPG 105.41 06/03/2018   Lab Results  Component Value Date   PROLACTIN 13.2 10/25/2014   Lab Results  Component Value Date   CHOL 187 06/03/2018   TRIG 33 06/03/2018   HDL 101 06/03/2018   CHOLHDL 1.9 06/03/2018   VLDL 7 06/03/2018   LDLCALC 79 06/03/2018   LDLCALC 134 (H) 11/20/2014    See Psychiatric Specialty Exam and Suicide Risk Assessment completed by Attending Physician prior to discharge.  Discharge destination:  Home  Is patient on multiple antipsychotic therapies at discharge:  No   Has Patient had three or more failed trials of antipsychotic monotherapy by history:  No  Recommended Plan for Multiple Antipsychotic Therapies: NA   Allergies as of 06/08/2018      Reactions   Wellbutrin  [bupropion] Other (See Comments)   Reaction:  Seizure       Medication List    STOP taking these medications   QUEtiapine 200 MG tablet Commonly known as:  SEROQUEL   QUEtiapine 300 MG tablet Commonly known as:  SEROQUEL     TAKE these medications     Indication  allopurinol 100 MG tablet Commonly known as:  ZYLOPRIM Take 1 tablet (100 mg total) by mouth daily. Start taking on:  06/09/2018  Indication:  Gout flare prophylaxis   colchicine 0.6 MG tablet Take 1 tablet (0.6 mg total) by mouth daily. Start taking on:  06/09/2018  Indication:  Gout   metoprolol tartrate 25 MG tablet Commonly known as:  LOPRESSOR Take 0.5 tablets (12.5 mg total) by mouth 2 (two) times daily.  Indication:  High Blood Pressure Disorder   paliperidone 156 MG/ML Susy injection Commonly known as:  INVEGA SUSTENNA Inject 1 mL (156 mg total) into the muscle every 30 (thirty) days. Last administration 06/07/18. Next administration due 07/07/18. Start taking on:  07/07/2018  Indication:  Schizophrenia   sertraline 25 MG tablet Commonly known as:  ZOLOFT Take 3 tablets (75 mg total) by mouth daily.  Indication:  Major Depressive Disorder   traZODone 100 MG tablet Commonly known as:  DESYREL Take 1 tablet (100 mg total) by mouth at bedtime as needed and may repeat dose one time if needed for sleep.  Indication:  Trouble Sleeping      Follow-up Information    [facility] Follow up on 06/10/2018.   Why:  Thursday at 8:00AM for your hospital follow up appointment with Earlie Server.  Bring your insurance card and your hospital d/c paperwork Contact information: [address] [place] [address] [phone]           Follow-up recommendations:  Activity:  as tolerated Diet:  normal Tests:  NA Other:  see above for DC plan  Comments:    Signed: [name], MD 06/08/2018, 10:39 AM

## 2018-06-08 NOTE — [facility] Group Notes (Signed)
[place] LCSW Group Therapy Note  Date/Time: 06/08/18, 1100  Type of Therapy/Topic:  Group Therapy:  Feelings about Diagnosis  Participation Level:  Active   Mood:pleasant   Description of Group:    This group will allow patients to explore their thoughts and feelings about diagnoses they have received. Patients will be guided to explore their level of understanding and acceptance of these diagnoses. Facilitator will encourage patients to process their thoughts and feelings about the reactions of others to their diagnosis, and will guide patients in identifying ways to discuss their diagnosis with significant others in their lives. This group will be process-oriented, with patients participating in exploration of their own experiences as well as giving and receiving support and challenge from other group members.   Therapeutic Goals: 1. Patient will demonstrate understanding of diagnosis as evidence by identifying two or more symptoms of the disorder:  2. Patient will be able to express two feelings regarding the diagnosis 3. Patient will demonstrate ability to communicate their needs through discussion and/or role plays  Summary of Patient Progress:Pt active participant during group today regarding diagnosis, acceptance of diagnosis, and stigma.  Pt very appropriate in discussing symptoms of schizophrenia and shared that he agrees that this is his correct diagnosis and that he needs to follow up with his MD after discharge to successfully manage it.        Therapeutic Modalities:   Cognitive Behavioral Therapy Brief Therapy Feelings Identification   Lurline Idol, LCSW

## 2018-06-08 NOTE — Progress Notes (Signed)
Recreation Therapy Notes  Date: 10.22.19 Time: 1000 Location: 500 [address] Dayroom  Group Topic: Wellness  Goal Area(s) Addresses:  Patient will define components of whole wellness. Patient will verbalize benefit of whole wellness.  Behavioral Response: Engaged  Intervention: Music  Activity: Exercise.  LRT lead patients in a series of stretches to get them warmed up.  Each patient got the opportunity to lead the group in an exercise. The group was to complete 30 minutes of exercises to get the blood flowing and heart rate up.  Patients were given a water break half way through the workout but could stop at any point if they needed water or needed a break.  Education: Wellness, Dentist.   Education Outcome: Acknowledges education/In group clarification offered/Needs additional education.   Clinical Observations/Feedback:  Pt was engaged and completed the exercises.  Pt stopped when he needed to because of his ankle.  Pt was smiling and active throughout the activity.     Victorino Sparrow, LRT/CTRS         Victorino Sparrow A 06/08/2018 11:37 AM

## 2018-06-08 NOTE — Progress Notes (Signed)
Patient discharged to lobby. Patient was stable and appreciative at that time. All papers, samples and prescriptions were given and valuables returned. Verbal understanding expressed. Denies SI/HI and A/VH. Patient given opportunity to express concerns and ask questions.  

## 2018-06-08 NOTE — Progress Notes (Signed)
  [facility] Adult Case Management Discharge Plan :  Will you be returning to the same living situation after discharge:  Yes,  own home At discharge, do you have transportation home?: Yes,  brother Do you have the ability to pay for your medications: Yes,  Medicare  Release of information consent forms completed and in the chart;  Patient's signature needed at discharge.  Patient to Follow up at: Follow-up Information    [facility] Follow up on 06/10/2018.   Why:  Thursday at 8:00AM for your hospital follow up appointment with Earlie Server.  Bring your insurance card and your hospital d/c paperwork Contact information: [place] [address] [phone]           Next level of care provider has access to [place] and Suicide Prevention discussed: No. Pt refused consent.  Have you used any form of tobacco in the last 30 days? (Cigarettes, Smokeless Tobacco, Cigars, and/or Pipes): Yes  Has patient been referred to the Quitline?: Patient refused referral  Patient has been referred for addiction treatment: Yes  Joanne Chars, [place] 06/08/2018, 9:23 AM

## 2018-06-08 NOTE — Plan of Care (Signed)
Pt attended and engaged in groups without prompting at conclusion of recreation therapy group sessions.   Victorino Sparrow, LRT/CTRS

## 2018-06-14 DIAGNOSIS — F250 Schizoaffective disorder, bipolar type: Secondary | ICD-10-CM | POA: Diagnosis not present

## 2018-06-14 DIAGNOSIS — F17200 Nicotine dependence, unspecified, uncomplicated: Secondary | ICD-10-CM | POA: Diagnosis not present

## 2018-06-25 ENCOUNTER — Inpatient Hospital Stay: Payer: Self-pay

## 2018-06-27 ENCOUNTER — Encounter (HOSPITAL_COMMUNITY): Payer: Self-pay | Admitting: Nurse Practitioner

## 2018-06-27 DIAGNOSIS — F329 Major depressive disorder, single episode, unspecified: Secondary | ICD-10-CM | POA: Diagnosis not present

## 2018-06-27 DIAGNOSIS — F1010 Alcohol abuse, uncomplicated: Secondary | ICD-10-CM | POA: Diagnosis not present

## 2018-06-27 DIAGNOSIS — Z79899 Other long term (current) drug therapy: Secondary | ICD-10-CM | POA: Insufficient documentation

## 2018-06-27 DIAGNOSIS — F17210 Nicotine dependence, cigarettes, uncomplicated: Secondary | ICD-10-CM | POA: Insufficient documentation

## 2018-06-27 DIAGNOSIS — F4310 Post-traumatic stress disorder, unspecified: Secondary | ICD-10-CM | POA: Diagnosis not present

## 2018-06-27 NOTE — ED Notes (Signed)
Bed: WLPT1 Expected date:  Expected time:  Means of arrival:  Comments: 

## 2018-06-27 NOTE — ED Triage Notes (Signed)
Pt states he is withdrawing from alcohol.

## 2018-06-28 ENCOUNTER — Emergency Department (HOSPITAL_COMMUNITY)
Admission: EM | Admit: 2018-06-28 | Discharge: 2018-06-28 | Disposition: A | Discharge status: Home / Self Care | Payer: Medicare Other | Attending: Emergency Medicine | Admitting: Emergency Medicine

## 2018-06-28 DIAGNOSIS — F17200 Nicotine dependence, unspecified, uncomplicated: Secondary | ICD-10-CM | POA: Diagnosis not present

## 2018-06-28 DIAGNOSIS — F250 Schizoaffective disorder, bipolar type: Secondary | ICD-10-CM | POA: Diagnosis not present

## 2018-06-28 DIAGNOSIS — F1010 Alcohol abuse, uncomplicated: Secondary | ICD-10-CM

## 2018-06-28 LAB — ETHANOL: Alcohol, Ethyl (B): 207 mg/dL — ABNORMAL HIGH (ref <10)

## 2018-06-28 LAB — CBC WITH DIFFERENTIAL/PLATELET
Abs Immature Granulocytes: 0.04 10*3/uL (ref 0.00–0.07)
Basophils Absolute: 0.1 10*3/uL (ref 0.0–0.1)
Basophils Relative: 1 %
Eosinophils Absolute: 0 10*3/uL (ref 0.0–0.5)
Eosinophils Relative: 1 %
HCT: 47.1 % (ref 39.0–52.0)
Hemoglobin: 15.9 g/dL (ref 13.0–17.0)
Immature Granulocytes: 1 %
Lymphocytes Relative: 41 %
Lymphs Abs: 3.3 10*3/uL (ref 0.7–4.0)
MCH: 30.3 pg (ref 26.0–34.0)
MCHC: 33.8 g/dL (ref 30.0–36.0)
MCV: 89.7 fL (ref 80.0–100.0)
Monocytes Absolute: 0.8 10*3/uL (ref 0.1–1.0)
Monocytes Relative: 10 %
Neutro Abs: 3.8 10*3/uL (ref 1.7–7.7)
Neutrophils Relative %: 46 %
Platelets: 328 10*3/uL (ref 150–400)
RBC: 5.25 MIL/uL (ref 4.22–5.81)
RDW: 13.7 % (ref 11.5–15.5)
WBC: 8 10*3/uL (ref 4.0–10.5)
nRBC: 0 % (ref 0.0–0.2)

## 2018-06-28 LAB — COMPREHENSIVE METABOLIC PANEL
ALT: 47 U/L — ABNORMAL HIGH (ref 0–44)
AST: 45 U/L — ABNORMAL HIGH (ref 15–41)
Albumin: 4.9 g/dL (ref 3.5–5.0)
Alkaline Phosphatase: 87 U/L (ref 38–126)
Anion gap: 19 — ABNORMAL HIGH (ref 5–15)
BUN: 6 mg/dL (ref 6–20)
CO2: 23 mmol/L (ref 22–32)
Calcium: 9.1 mg/dL (ref 8.9–10.3)
Chloride: 97 mmol/L — ABNORMAL LOW (ref 98–111)
Creatinine, Ser: 0.68 mg/dL (ref 0.61–1.24)
GFR calc Af Amer: >60 mL/min (ref >60)
GFR calc non Af Amer: >60 mL/min (ref >60)
Glucose, Bld: 105 mg/dL — ABNORMAL HIGH (ref 70–99)
Potassium: 3.9 mmol/L (ref 3.5–5.1)
Sodium: 139 mmol/L (ref 135–145)
Total Bilirubin: 0.6 mg/dL (ref 0.3–1.2)
Total Protein: 8.1 g/dL (ref 6.5–8.1)

## 2018-06-28 LAB — RAPID URINE DRUG SCREEN, HOSP PERFORMED
Amphetamines: NOT DETECTED
Barbiturates: NOT DETECTED
Benzodiazepines: NOT DETECTED
Cocaine: NOT DETECTED
Opiates: NOT DETECTED
Tetrahydrocannabinol: POSITIVE — AB

## 2018-06-28 MED ORDER — LORAZEPAM 1 MG PO TABS
1.0000 mg | ORAL_TABLET | Freq: Once | ORAL | Status: AC
  Administered 2018-06-28: 1 mg via ORAL
  Filled 2018-06-28: qty 1

## 2018-06-28 MED ORDER — CHLORDIAZEPOXIDE HCL 25 MG PO CAPS: ORAL_CAPSULE | ORAL | 0 refills | Status: DC

## 2018-06-28 MED ORDER — LORAZEPAM 1 MG PO TABS: 1.0000 mg | ORAL_TABLET | Freq: Three times a day (TID) | ORAL | 0 refills | Status: DC | PRN

## 2018-06-28 NOTE — ED Provider Notes (Signed)
[facility] DEPT Provider Note   CSN: [id number] Arrival date & time: 06/27/18  2203     History   Chief Complaint Chief Complaint  Patient presents with  . Alcohol Problem    HPI [name] is a 47 y.o. male.  HPI Patient presents to the emergency department with stating that he has an alcohol problem.  The patient states that he would like to stop drinking.  The patient states that he last drank yesterday.  Patient states that nothing seems to make the condition better or worse.  Patient denies being suicidal or homicidal at this time.  The patient denies chest pain, shortness of breath, headache,blurred vision, neck pain, fever, cough, weakness, numbness, dizziness, anorexia, edema, abdominal pain, nausea, vomiting, diarrhea, rash, back pain, dysuria, hematemesis, bloody stool, near syncope, or syncope. Past Medical History:  Diagnosis Date  . Alcohol abuse   . Anxiety    at age 33  . Depression    at age 45  . Psychosis ([place])   . PTSD (post-traumatic stress disorder)   . Schizoaffective disorder   . Seizure disorder ([place])    related to etoh seizure    Patient Active Problem List   Diagnosis Date Noted  . Schizoaffective disorder, bipolar type ([place]) 06/02/2018  . Low back pain 08/21/2016  . Alcohol dependence with uncomplicated withdrawal ([place]) 02/22/2016  . Alcohol-induced mood disorder ([place]) 02/22/2016  . Gout 10/22/2015  . GERD (gastroesophageal reflux disease) 10/22/2015  . Alcohol withdrawal seizure with complication ([place]) 12/45/8099  . Schizoaffective disorder, depressive type ([place])   . History of alcohol abuse   . Tobacco dependence due to cigarettes 11/20/2014  . Post traumatic stress disorder (PTSD) 11/03/2011    Past Surgical History:  Procedure Laterality Date  . FOOT SURGERY     right  . HAND SURGERY          Home Medications    Prior to Admission medications   Medication Sig Start Date End Date Taking?  Authorizing Provider  allopurinol (ZYLOPRIM) 100 MG tablet Take 1 tablet (100 mg total) by mouth daily. 06/09/18   [name], MD  colchicine 0.6 MG tablet Take 1 tablet (0.6 mg total) by mouth daily. 06/09/18   [name], MD  metoprolol tartrate (LOPRESSOR) 25 MG tablet Take 0.5 tablets (12.5 mg total) by mouth 2 (two) times daily. 06/08/18   [name], MD  paliperidone (INVEGA SUSTENNA) 156 MG/ML SUSY injection Inject 1 mL (156 mg total) into the muscle every 30 (thirty) days. Last administration 06/07/18. Next administration due 07/07/18. 07/07/18   [name], MD  sertraline (ZOLOFT) 25 MG tablet Take 3 tablets (75 mg total) by mouth daily. 06/08/18   [name], MD  traZODone (DESYREL) 100 MG tablet Take 1 tablet (100 mg total) by mouth at bedtime as needed and may repeat dose one time if needed for sleep. 06/08/18   [name], MD    Family History Family History  Problem Relation Age of Onset  . Alcoholism Mother   . CAD Father   . Depression Brother     Social History Social History   Tobacco Use  . Smoking status: Current Every Day Smoker    Packs/day: 1.00    Years: 24.00    Pack years: 24.00    Types: Cigarettes  . Smokeless tobacco: Never Used  Substance Use Topics  . Alcohol use: Yes    Alcohol/week: 0.0 standard drinks  Frequency: Never    Comment: drinking daily   . Drug use: Yes    Frequency: 1.0 times per week    Types: Marijuana    Comment: THC 2 to 3 times per month. last use 09/24/2015     Allergies   Wellbutrin [bupropion]   Review of Systems Review of Systems All other systems negative except as documented in the HPI. All pertinent positives and negatives as reviewed in the HPI.   Physical Exam Updated Vital Signs BP (!) 146/91   Pulse (!) 105   Temp 97.8 F (36.6 C)   Resp 18   SpO2 93%   Physical Exam  Constitutional: He is oriented to person, place,  and time. He appears well-developed and well-nourished. No distress.  HENT:  Head: Normocephalic and atraumatic.  Mouth/Throat: Oropharynx is clear and moist.  Eyes: Pupils are equal, round, and reactive to light.  Neck: Normal range of motion. Neck supple.  Cardiovascular: Normal rate, regular rhythm and normal heart sounds. Exam reveals no gallop and no friction rub.  No murmur heard. Pulmonary/Chest: Effort normal and breath sounds normal. No respiratory distress. He has no wheezes.  Abdominal: Soft. Bowel sounds are normal. He exhibits no distension. There is no tenderness.  Neurological: He is alert and oriented to person, place, and time. He exhibits normal muscle tone. Coordination normal.  Skin: Skin is warm and dry. Capillary refill takes less than 2 seconds. No rash noted. No erythema.  Psychiatric: He has a normal mood and affect. His behavior is normal.  Nursing note and vitals reviewed.    ED Treatments / Results  Labs (all labs ordered are listed, but only abnormal results are displayed) Labs Reviewed  COMPREHENSIVE METABOLIC PANEL - Abnormal; Notable for the following components:      Result Value   Chloride 97 (*)    Glucose, Bld 105 (*)    AST 45 (*)    ALT 47 (*)    Anion gap 19 (*)    All other components within normal limits  ETHANOL - Abnormal; Notable for the following components:   Alcohol, Ethyl (B) 207 (*)    All other components within normal limits  RAPID URINE DRUG SCREEN, HOSP PERFORMED - Abnormal; Notable for the following components:   Tetrahydrocannabinol POSITIVE (*)    All other components within normal limits  CBC WITH DIFFERENTIAL/PLATELET    EKG None  Radiology No results found.  Procedures Procedures (including critical care time)  Medications Ordered in ED Medications  LORazepam (ATIVAN) tablet 1 mg (1 mg Oral Given 06/28/18 0200)     Initial Impression / Assessment and Plan / ED Course  I have reviewed the triage vital  signs and the nursing notes.  Pertinent labs & imaging results that were available during my care of the patient were reviewed by me and considered in my medical decision making (see chart for details).    Patient would like help with alcohol detox.  Advised patient that we can give him resources for this but we did not do any detox in our hospitals.  Patient has an alcohol of 207 at this time.   Final Clinical Impressions(s) / ED Diagnoses   Final diagnoses:  None    ED Discharge Orders    None       Dalia Heading, PA-C 06/28/18 [id number]    Gareth Morgan, MD 06/28/18 1946

## 2018-06-28 NOTE — Discharge Instructions (Addendum)
Return here as needed. Follow up with the detox facilities provided.

## 2018-06-30 ENCOUNTER — Ambulatory Visit (INDEPENDENT_AMBULATORY_CARE_PROVIDER_SITE_OTHER): Payer: Medicare Other | Admitting: Family Medicine

## 2018-06-30 VITALS — BP 131/90 | HR 90 | Temp 98.2°F | Resp 17 | Ht 72.0 in | Wt 219.6 lb

## 2018-06-30 DIAGNOSIS — F1010 Alcohol abuse, uncomplicated: Secondary | ICD-10-CM | POA: Diagnosis not present

## 2018-06-30 DIAGNOSIS — M1A9XX0 Chronic gout, unspecified, without tophus (tophi): Secondary | ICD-10-CM | POA: Diagnosis not present

## 2018-06-30 DIAGNOSIS — I10 Essential (primary) hypertension: Secondary | ICD-10-CM

## 2018-06-30 MED ORDER — METOPROLOL TARTRATE 25 MG PO TABS: 12.5000 mg | ORAL_TABLET | Freq: Two times a day (BID) | ORAL | 2 refills | Status: DC

## 2018-06-30 MED ORDER — ALBUTEROL SULFATE HFA 108 (90 BASE) MCG/ACT IN AERS: 2.0000 | INHALATION_SPRAY | Freq: Four times a day (QID) | RESPIRATORY_TRACT | 2 refills | Status: DC | PRN

## 2018-06-30 MED ORDER — PANTOPRAZOLE SODIUM 40 MG PO TBEC: 40.0000 mg | DELAYED_RELEASE_TABLET | Freq: Every day | ORAL | 1 refills | Status: DC

## 2018-06-30 MED ORDER — ALLOPURINOL 100 MG PO TABS: 100.0000 mg | ORAL_TABLET | Freq: Every day | ORAL | 2 refills | Status: DC

## 2018-06-30 NOTE — Progress Notes (Signed)
[name], is a 47 y.o. male  LGX:[id number]  [id number]  DOB - August 19, 1970  CC:  Chief Complaint  Patient presents with  . Follow-up    ED 11/10: alcohol problem       HPI: [name] is a 47 y.o. male is here today to establish care.   [name] has Post traumatic stress disorder (PTSD); Tobacco dependence due to cigarettes; Schizoaffective disorder, depressive type ([place]); History of alcohol abuse; Alcohol withdrawal seizure with complication ([place]); Gout; GERD (gastroesophageal reflux disease); Alcohol dependence with uncomplicated withdrawal ([place]); Alcohol-induced mood disorder ([place]); Low back pain; and Schizoaffective disorder, bipolar type [facility]) on their problem list.    Today's visit:  [name] here to establish and ER follow-up. Previous patient at [facility] and has not been seen in over 12 months. He recently presented to the ER on 06/27/2018 intoxicated requesting admission for detox. ETOH level 207. He was evaluated, deemed to be stable, given resources and discharged. Since ER visit, he has followed with [facility] and resumed medications for schizoaffective disorder, Bipolar with anxiety and depression. He also suffers from gout with flares up when abuses alcohol. He drinks primarily large volumes of malt liquor.  Most recent gout flare was a couple weeks ago.  He is prescribed allopurinol but has been out of medication for a while he wishes to have refills.  He denies any suicidal ideation, homicidal ideations, auditory hallucinations, chest pain, shortness of breath, or abdominal pain.  Scheduled to start one-on-one group therapy at [facility] next week and has a 72-month follow-up with a psychiatrist scheduled.  Current medications: Current Outpatient Medications:  .  allopurinol (ZYLOPRIM) 100 MG tablet, Take 1 tablet (100 mg total) by mouth daily., Disp: 30 tablet, Rfl: 0 .  chlordiazePOXIDE (LIBRIUM) 25 MG capsule, 50mg  PO TID x 1D, then 25-50mg  PO BID X 1D, then  25-50mg  PO QD X 1D, Disp: 10 capsule, Rfl: 0 .  colchicine 0.6 MG tablet, Take 1 tablet (0.6 mg total) by mouth daily. (Patient taking differently: Take 0.6 mg by mouth daily as needed (gout). ), Disp: 30 tablet, Rfl: 0 .  LORazepam (ATIVAN) 1 MG tablet, Take 1 tablet (1 mg total) by mouth 3 (three) times daily as needed for anxiety., Disp: 15 tablet, Rfl: 0 .  metoprolol tartrate (LOPRESSOR) 25 MG tablet, Take 0.5 tablets (12.5 mg total) by mouth 2 (two) times daily., Disp: 30 tablet, Rfl: 0 .  [START ON 07/07/2018] paliperidone (INVEGA SUSTENNA) 156 MG/ML SUSY injection, Inject 1 mL (156 mg total) into the muscle every 30 (thirty) days. Last administration 06/07/18. Next administration due 07/07/18., Disp: 1.2 mL, Rfl: 0 .  pantoprazole (PROTONIX) 40 MG tablet, Take 40 mg by mouth daily. , Disp: , Rfl:  .  sertraline (ZOLOFT) 25 MG tablet, Take 3 tablets (75 mg total) by mouth daily., Disp: 45 tablet, Rfl: 1 .  traZODone (DESYREL) 100 MG tablet, Take 1 tablet (100 mg total) by mouth at bedtime as needed and may repeat dose one time if needed for sleep., Disp: 30 tablet, Rfl: 0   Pertinent family medical history: family history includes Alcoholism in his mother; CAD in his father; Depression in his brother.   Allergies  Allergen Reactions  . Wellbutrin [Bupropion] Other (See Comments)    Reaction:  Seizure     Social History   Socioeconomic History  . Marital status: Divorced    Spouse name: Not on file  . Number of children: Not on file  . Years of education:  Not on file  . Highest education level: Not on file  Occupational History  . Not on file  Social Needs  . Financial resource strain: Not on file  . Food insecurity:    Worry: Not on file    Inability: Not on file  . Transportation needs:    Medical: Not on file    Non-medical: Not on file  Tobacco Use  . Smoking status: Current Every Day Smoker    Packs/day: 1.00    Years: 24.00    Pack years: 24.00    Types: Cigarettes   . Smokeless tobacco: Never Used  Substance and Sexual Activity  . Alcohol use: Yes    Alcohol/week: 0.0 standard drinks    Frequency: Never    Comment: drinking daily   . Drug use: Yes    Frequency: 1.0 times per week    Types: Marijuana    Comment: THC 2 to 3 times per month. last use 09/24/2015  . Sexual activity: Yes    Birth control/protection: Condom  Lifestyle  . Physical activity:    Days per week: Not on file    Minutes per session: Not on file  . Stress: Not on file  Relationships  . Social connections:    Talks on phone: Not on file    Gets together: Not on file    Attends religious service: Not on file    Active member of club or organization: Not on file    Attends meetings of clubs or organizations: Not on file    Relationship status: Not on file  . Intimate partner violence:    Fear of current or ex partner: Not on file    Emotionally abused: Not on file    Physically abused: Not on file    Forced sexual activity: Not on file  Other Topics Concern  . Not on file  Social History Narrative  . Not on file    Review of Systems: Constitutional: Negative for fever, chills, diaphoresis, activity change, appetite change and fatigue. HENT: Negative for ear pain, nosebleeds, congestion, facial swelling, rhinorrhea, neck pain, neck stiffness and ear discharge.  Eyes: Negative for pain, discharge, redness, itching and visual disturbance. Respiratory: Negative for cough, choking, chest tightness, shortness of breath, wheezing and stridor.  Cardiovascular: Negative for chest pain, palpitations and leg swelling. Gastrointestinal: Negative for abdominal distention. Genitourinary: Negative for dysuria, urgency, frequency, hematuria, flank pain, decreased urine volume, difficulty urinating. Musculoskeletal: Negative for back pain, joint swelling, arthralgia and gait problem. Neurological: Negative for dizziness, tremors, seizures, syncope, facial asymmetry, speech difficulty,  weakness, light-headedness, numbness and headaches.  Hematological: Negative for adenopathy. Does not bruise/bleed easily. Psychiatric/Behavioral: Negative for hallucinations, behavioral problems, confusion, dysphoric mood, decreased concentration and agitation.  Objective:   Vitals:   06/30/18 1356  BP: 131/90  Pulse: 90  Resp: 17  Temp: 98.2 F (36.8 C)  SpO2: 94%    BP Readings from Last 3 Encounters:  06/30/18 131/90  06/28/18 (!) 143/85  06/02/18 (!) 144/96    Filed Weights   06/30/18 1356  Weight: 219 lb 9.6 oz (99.6 kg)      Physical Exam: Constitutional: Patient appears well-developed and well-nourished. No distress. HENT: Normocephalic, atraumatic, External right and left ear normal. Oropharynx is clear and moist.  Eyes: Conjunctivae and EOM are normal. PERRLA, no scleral icterus. Neck: Normal ROM. Neck supple. No JVD. No tracheal deviation. No thyromegaly. CVS: RRR, S1/S2 +, no murmurs, no gallops, no carotid bruit.  Pulmonary: Effort and  breath sounds normal, no stridor, rhonchi, wheezes, rales.  Abdominal: Soft. BS +, no distension, tenderness, rebound or guarding.  Musculoskeletal: Normal range of motion. No edema and no tenderness.  Neuro: Alert. Normal muscle tone coordination. Normal gait. BUE and BLE strength 5/5. Bilateral hand grips symmetrical. Skin: Skin is warm and dry. No rash noted. Not diaphoretic. No erythema. No pallor. Psychiatric: Normal mood and affect. Behavior, judgment, thought content normal.Lab Results (prior encounters)  Lab Results  Component Value Date   WBC 8.0 06/28/2018   HGB 15.9 06/28/2018   HCT 47.1 06/28/2018   MCV 89.7 06/28/2018   PLT 328 06/28/2018   Lab Results  Component Value Date   CREATININE 0.68 06/28/2018   BUN 6 06/28/2018   NA 139 06/28/2018   K 3.9 06/28/2018   CL 97 (L) 06/28/2018   CO2 23 06/28/2018    Lab Results  Component Value Date   HGBA1C 5.3 06/03/2018       Component Value Date/Time    CHOL 187 06/03/2018 0639   TRIG 33 06/03/2018 0639   HDL 101 06/03/2018 0639   CHOLHDL 1.9 06/03/2018 0639   VLDL 7 06/03/2018 0639   LDLCALC 79 06/03/2018 0639        Assessment and plan:  1. Alcohol abuse -Declined rehabilitation -Currently complaint with mental health therapy and medications  2. Chronic gout without tophus, unspecified cause, unspecified site Recent flares secondary to alcohol intake. Encouraged hydration with water.  Discussed dietary restrictions. Checking uric acid.  3. Essential hypertension, controlled today We have discussed target BP range and blood pressure goal. I have advised patient to check BP regularly and to call us back or report to clinic if the numbers are consistently higher than 140/90. We discussed the importance of compliance with medical therapy and DASH diet recommended, consequences of uncontrolled hypertension discussed.  - continue current BP medications   Orders Placed This Encounter  Procedures  . Uric Acid   Meds ordered this encounter  Medications  . pantoprazole (PROTONIX) 40 MG tablet    Sig: Take 1 tablet (40 mg total) by mouth daily.    Dispense:  90 tablet    Refill:  1  . metoprolol tartrate (LOPRESSOR) 25 MG tablet    Sig: Take 0.5 tablets (12.5 mg total) by mouth 2 (two) times daily.    Dispense:  60 tablet    Refill:  2  . DISCONTD: allopurinol (ZYLOPRIM) 100 MG tablet    Sig: Take 1 tablet (100 mg total) by mouth daily.    Dispense:  90 tablet    Refill:  2  . albuterol (PROVENTIL HFA;VENTOLIN HFA) 108 (90 Base) MCG/ACT inhaler    Sig: Inhale 2 puffs into the lungs every 6 (six) hours as needed for wheezing or shortness of breath.    Dispense:  1 Inhaler    Refill:  2  . allopurinol (ZYLOPRIM) 100 MG tablet    Sig: Take 2 tablets (200 mg total) by mouth daily.    Dispense:  60 tablet    Refill:  2     A total of 35 minutes spent, greater than 50 % of this time was spent counseling and coordination of  care.    Return in about 6 months (around 12/29/2018).   The patient was given clear instructions to go to ER or return to medical center if symptoms don't improve, worsen or new problems develop. The patient verbalized understanding. The patient was advised  to call and obtain  lab results if they haven't heard anything from out office within 7-10 business days.  Molli Barrows, FNP Primary Care at [facility] [address], [place] [address] [phone]: [phone]    This note has been created with Dragon speech recognition software and Engineer, materials. Any transcriptional errors are unintentional.

## 2018-06-30 NOTE — Patient Instructions (Addendum)
  Thank you for choosing Primary Care at [facility] for your medical home!    [name] was seen by Molli Barrows, FNP today.   [name]'s primary care doctor is Molli Barrows, FNP, For the best care possible,  you should try to see Molli Barrows, FNP-C  whenever you come to clinic.   We look forward to seeing you again soon!  If you have any questions about your visit today,  please call us at [phone]  Or feel free to reach your provider via [place].

## 2018-07-01 LAB — URIC ACID: Uric Acid: 9.4 mg/dL — ABNORMAL HIGH (ref 3.7–8.6)

## 2018-07-02 ENCOUNTER — Encounter: Payer: Self-pay | Admitting: Family Medicine

## 2018-07-02 MED ORDER — ALLOPURINOL 100 MG PO TABS: 200.0000 mg | ORAL_TABLET | Freq: Every day | ORAL | 2 refills | Status: DC

## 2018-07-07 DIAGNOSIS — F17200 Nicotine dependence, unspecified, uncomplicated: Secondary | ICD-10-CM | POA: Diagnosis not present

## 2018-07-07 DIAGNOSIS — F250 Schizoaffective disorder, bipolar type: Secondary | ICD-10-CM | POA: Diagnosis not present

## 2018-08-04 DIAGNOSIS — F250 Schizoaffective disorder, bipolar type: Secondary | ICD-10-CM | POA: Diagnosis not present

## 2018-08-04 DIAGNOSIS — F17200 Nicotine dependence, unspecified, uncomplicated: Secondary | ICD-10-CM | POA: Diagnosis not present

## 2018-10-04 DIAGNOSIS — Z79899 Other long term (current) drug therapy: Secondary | ICD-10-CM | POA: Insufficient documentation

## 2018-10-04 DIAGNOSIS — F259 Schizoaffective disorder, unspecified: Secondary | ICD-10-CM | POA: Insufficient documentation

## 2018-10-04 DIAGNOSIS — R45851 Suicidal ideations: Secondary | ICD-10-CM | POA: Diagnosis not present

## 2018-10-04 DIAGNOSIS — F17210 Nicotine dependence, cigarettes, uncomplicated: Secondary | ICD-10-CM | POA: Insufficient documentation

## 2018-10-05 ENCOUNTER — Encounter (HOSPITAL_COMMUNITY): Payer: Self-pay | Admitting: Emergency Medicine

## 2018-10-05 ENCOUNTER — Emergency Department (HOSPITAL_COMMUNITY)
Admission: EM | Admit: 2018-10-05 | Discharge: 2018-10-05 | Disposition: A | Discharge status: Home / Self Care | Payer: Medicare Other | Attending: Emergency Medicine | Admitting: Emergency Medicine

## 2018-10-05 ENCOUNTER — Other Ambulatory Visit: Payer: Self-pay

## 2018-10-05 DIAGNOSIS — R45851 Suicidal ideations: Secondary | ICD-10-CM

## 2018-10-05 LAB — RAPID URINE DRUG SCREEN, HOSP PERFORMED
Amphetamines: NOT DETECTED
Barbiturates: NOT DETECTED
Benzodiazepines: NOT DETECTED
Cocaine: NOT DETECTED
Opiates: NOT DETECTED
Tetrahydrocannabinol: POSITIVE — AB

## 2018-10-05 LAB — COMPREHENSIVE METABOLIC PANEL
ALT: 39 U/L (ref 0–44)
AST: 30 U/L (ref 15–41)
Albumin: 4.1 g/dL (ref 3.5–5.0)
Alkaline Phosphatase: 69 U/L (ref 38–126)
Anion gap: 10 (ref 5–15)
BUN: 6 mg/dL (ref 6–20)
CO2: 24 mmol/L (ref 22–32)
Calcium: 9.2 mg/dL (ref 8.9–10.3)
Chloride: 107 mmol/L (ref 98–111)
Creatinine, Ser: 0.88 mg/dL (ref 0.61–1.24)
GFR calc Af Amer: >60 mL/min (ref >60)
GFR calc non Af Amer: >60 mL/min (ref >60)
Glucose, Bld: 88 mg/dL (ref 70–99)
Potassium: 4 mmol/L (ref 3.5–5.1)
Sodium: 141 mmol/L (ref 135–145)
Total Bilirubin: 0.6 mg/dL (ref 0.3–1.2)
Total Protein: 7.2 g/dL (ref 6.5–8.1)

## 2018-10-05 LAB — CBC
HCT: 46.9 % (ref 39.0–52.0)
Hemoglobin: 15.4 g/dL (ref 13.0–17.0)
MCH: 31.7 pg (ref 26.0–34.0)
MCHC: 32.8 g/dL (ref 30.0–36.0)
MCV: 96.5 fL (ref 80.0–100.0)
Platelets: 227 10*3/uL (ref 150–400)
RBC: 4.86 MIL/uL (ref 4.22–5.81)
RDW: 14.7 % (ref 11.5–15.5)
WBC: 7.4 10*3/uL (ref 4.0–10.5)
nRBC: 0 % (ref 0.0–0.2)

## 2018-10-05 LAB — ETHANOL: Alcohol, Ethyl (B): 177 mg/dL — ABNORMAL HIGH (ref <10)

## 2018-10-05 LAB — ACETAMINOPHEN LEVEL: Acetaminophen (Tylenol), Serum: <10 ug/mL — ABNORMAL LOW (ref 10–30)

## 2018-10-05 LAB — SALICYLATE LEVEL: Salicylate Lvl: <7 mg/dL (ref 2.8–30.0)

## 2018-10-05 MED ORDER — LORAZEPAM 1 MG PO TABS: 0.0000 mg | ORAL_TABLET | Freq: Two times a day (BID) | ORAL | Status: DC

## 2018-10-05 MED ORDER — LORAZEPAM 2 MG/ML IJ SOLN: 0.0000 mg | Freq: Two times a day (BID) | INTRAMUSCULAR | Status: DC

## 2018-10-05 MED ORDER — METOPROLOL TARTRATE 25 MG PO TABS: 12.5000 mg | ORAL_TABLET | Freq: Two times a day (BID) | ORAL | Status: DC

## 2018-10-05 MED ORDER — PANTOPRAZOLE SODIUM 40 MG PO TBEC: 40.0000 mg | DELAYED_RELEASE_TABLET | Freq: Every day | ORAL | Status: DC

## 2018-10-05 MED ORDER — LORAZEPAM 2 MG/ML IJ SOLN: 0.0000 mg | Freq: Four times a day (QID) | INTRAMUSCULAR | Status: DC

## 2018-10-05 MED ORDER — ALBUTEROL SULFATE HFA 108 (90 BASE) MCG/ACT IN AERS: 2.0000 | INHALATION_SPRAY | Freq: Four times a day (QID) | RESPIRATORY_TRACT | Status: DC | PRN

## 2018-10-05 MED ORDER — THIAMINE HCL 100 MG/ML IJ SOLN: 100.0000 mg | Freq: Every day | INTRAMUSCULAR | Status: DC

## 2018-10-05 MED ORDER — LORAZEPAM 1 MG PO TABS: 0.0000 mg | ORAL_TABLET | Freq: Four times a day (QID) | ORAL | Status: DC

## 2018-10-05 MED ORDER — VITAMIN B-1 100 MG PO TABS: 100.0000 mg | ORAL_TABLET | Freq: Every day | ORAL | Status: DC

## 2018-10-05 MED ORDER — LORAZEPAM 1 MG PO TABS: 1.0000 mg | ORAL_TABLET | Freq: Three times a day (TID) | ORAL | Status: DC | PRN

## 2018-10-05 MED ORDER — SERTRALINE HCL 50 MG PO TABS
75.0000 mg | ORAL_TABLET | Freq: Every day | ORAL | Status: DC
  Filled 2018-10-05: qty 1

## 2018-10-05 NOTE — Discharge Instructions (Addendum)
Please return to the emergency department immediately if you develop any new or worsening symptoms.  Please follow-up outpatient at [facility].

## 2018-10-05 NOTE — ED Triage Notes (Signed)
Pt BIB [facility] voluntarily, reports SI, states he will "take every single pill I have". Pt calm and cooperative at this time.

## 2018-10-05 NOTE — ED Provider Notes (Signed)
Patient with suicidal ideations when intoxicated last night.  He was evaluated by TTS and psychiatry this morning.  Patient dispositioned home.  He no longer endorses suicidal or homicidal ideation.  Patient given outpatient resources.  Patient evaluated by myself and he denied SI or HI to me as well.  Patient understands and agrees with plan.  Patient vitals stable discharge in satisfactory condition.    Frederica Kuster, PA-C 10/05/18 1423    Fredia Sorrow, MD 10/06/18 1304

## 2018-10-05 NOTE — Progress Notes (Signed)
Patient recommended for d/c per Ricky Ala, NP.  CSW called to advise pt's nurse, Jerene Pitch, RN, who agreed to notify EDP for d/c summary.  Areatha Keas. Judi Cong, MSW, [place] Disposition Clinical Social Work [phone] (cell) [phone] (office)

## 2018-10-05 NOTE — ED Notes (Signed)
Patient verbalizes understanding of discharge instructions. Opportunity for questioning and answers were provided. Armband removed by staff, pt discharged from ED.  

## 2018-10-05 NOTE — ED Notes (Signed)
Jacqueline(SR)Breakfast Tray Ordered @ 0940-per Cindy, RN-called by Levada Dy

## 2018-10-05 NOTE — BH Assessment (Addendum)
Tele Assessment Note   Patient Name: [name] MRN: [id number] Referring Physician: Pryor Curia, DO Location of Patient: [facility] Location of Provider: [place]  Nicolaus Andel is an 48 y.o. male. PT came to [facility] after making suicidal statements to his sister.  Upon TTS assessment today, pt denies SI/HI/AV.  Pt reports he drank alcohol for the first time in a month last night and has been feeling somewhat depressed anyway, particularly has been worrying about his elderly mother.  Pt followed at [facility], has been taking his medication, but has missed his last invega injection.  Pt wants to be discharged back home.   With pt permission, TTS spoke to pt sister, Indy Kuck.  She reports she was concerned last night because she did not think pt had been drinking and pt typically has made suicidal statements in the past only when drinking.  She reports she does not have other concerns related to pt safety but worries because pt just sits at home in his apartment and is not active socially in any way.  We discussed [facility] and possible day programs.  Sister reports pt has been to [facility] multiple times in the past and she is not sure it would make any big difference for him to be admitted today.  Diagnosis: schizoaffective disorder  Past Medical History:  Past Medical History:  Diagnosis Date  . Alcohol abuse   . Anxiety    at age 48  . Depression    at age 29  . Psychosis ([place])   . PTSD (post-traumatic stress disorder)   . Schizoaffective disorder   . Seizure disorder ([place])    related to etoh seizure    Past Surgical History:  Procedure Laterality Date  . FOOT SURGERY     right  . HAND SURGERY      Family History:  Family History  Problem Relation Age of Onset  . Alcoholism Mother   . CAD Father   . Depression Brother     Social History:  reports that he has been smoking cigarettes. He has a 24.00 pack-year smoking history. He has never used  smokeless tobacco. He reports current alcohol use. He reports current drug use. Frequency: 1.00 time per week. Drug: Marijuana.  Additional Social History:  Alcohol / Drug Use Pain Medications: pt denies Prescriptions: pt denies Over the Counter: pt denies History of alcohol / drug use?: Yes Negative Consequences of Use: Legal Substance #1 Name of Substance 1: alcohol 1 - Age of First Use: 14 1 - Amount (size/oz): 4 40 oz beers 1 - Frequency: drank on 2/17, "first time in a month" 1 - Last Use / Amount: 2/17, 4 40 oz beers Substance #2 Name of Substance 2: marijuana 2 - Age of First Use: 15 2 - Amount (size/oz): 2 bowls 2 - Frequency: 1x week  CIWA: CIWA-Ar BP: 120/83 Pulse Rate: 81 COWS:    Allergies:  Allergies  Allergen Reactions  . Wellbutrin [Bupropion] Other (See Comments)    Reaction:  Seizure     Home Medications: (Not in a hospital admission)   OB/GYN Status:  No LMP for male patient.  General Assessment Data Assessment unable to be completed: Yes Reason for not completing assessment: Pt in hall chair.  Location of Assessment: [facility] ED TTS Assessment: In system Is this a Tele or Face-to-Face Assessment?: Tele Assessment Is this an Initial Assessment or a Re-assessment for this encounter?: Initial Assessment Patient Accompanied by:: N/A Language Other than English:  No What gender do you identify as?: Male Marital status: Divorced Living Arrangements: Alone Can pt return to current living arrangement?: Yes Admission Status: Voluntary Is patient capable of signing voluntary admission?: Yes Referral Source: Self/Family/Friend     Crisis Care Plan Living Arrangements: Alone Name of Psychiatrist: Beverly Sessions Name of Therapist: none  Education Status Is patient currently in school?: No Is the patient employed, unemployed or receiving disability?: Receiving disability income  Risk to self with the past 6 months Suicidal Ideation: No-Not Currently/Within  Last 6 Months Has patient been a risk to self within the past 6 months prior to admission? : No Suicidal Intent: No Has patient had any suicidal intent within the past 6 months prior to admission? : No Is patient at risk for suicide?: No Suicidal Plan?: No Has patient had any suicidal plan within the past 6 months prior to admission? : No Access to Means: No What has been your use of drugs/alcohol within the last 12 months?: pt reports minimal alcohol and marijuana use Previous Attempts/Gestures: Yes How many times?: 2 Triggers for Past Attempts: Other personal contacts Intentional Self Injurious Behavior: None Family Suicide History: No Recent stressful life event(s): Other (Comment)(None.  "I'm a little worried about my mom's health.") Persecutory voices/beliefs?: No Depression: No Substance abuse history and/or treatment for substance abuse?: Yes  Risk to Others within the past 6 months Homicidal Ideation: No Does patient have any lifetime risk of violence toward others beyond the six months prior to admission? : No Thoughts of Harm to Others: No Current Homicidal Intent: No Current Homicidal Plan: No Access to Homicidal Means: No History of harm to others?: No Assessment of Violence: None Noted Does patient have access to weapons?: No Criminal Charges Pending?: No Does patient have a court date: No Is patient on probation?: No  Psychosis Hallucinations: None noted Delusions: None noted  Mental Status Report Appearance/Hygiene: Unremarkable Eye Contact: Good Motor Activity: Unremarkable Speech: Logical/coherent Level of Consciousness: Alert Mood: Pleasant Affect: Appropriate to circumstance Anxiety Level: None Thought Processes: Coherent, Relevant Judgement: Unimpaired Orientation: Person, Place, Time, Situation Obsessive Compulsive Thoughts/Behaviors: None  Cognitive Functioning Concentration: Normal Memory: Recent Intact, Remote Intact Is patient IDD:  No Insight: Fair Impulse Control: Fair Appetite: Good Have you had any weight changes? : No Change Sleep: Decreased Total Hours of Sleep: 5 Vegetative Symptoms: None  ADLScreening [facility] Assessment Services) Patient's cognitive ability adequate to safely complete daily activities?: Yes Patient able to express need for assistance with ADLs?: Yes Independently performs ADLs?: Yes (appropriate for developmental age)  Prior Inpatient Therapy Prior Inpatient Therapy: Yes Prior Therapy Dates: 2019, 2017 Prior Therapy Facilty/Provider(s): [facility] Reason for Treatment: psych  Prior Outpatient Therapy Prior Outpatient Therapy: Yes Prior Therapy Dates: current Prior Therapy Facilty/Provider(s): [facility] Reason for Treatment: meds Does patient have an ACCT team?: No Does patient have Intensive In-House Services?  : No Does patient have [facility] services? : Yes Does patient have [facility] services?: No  ADL Screening (condition at time of admission) Patient's cognitive ability adequate to safely complete daily activities?: Yes Patient able to express need for assistance with ADLs?: Yes Independently performs ADLs?: Yes (appropriate for developmental age)       Abuse/Neglect Assessment (Assessment to be complete while patient is alone) Abuse/Neglect Assessment Can Be Completed: Yes Physical Abuse: Denies Verbal Abuse: Denies Sexual Abuse: Denies Exploitation of patient/patient's resources: Denies Self-Neglect: Denies     Regulatory affairs officer (For Healthcare) Does Patient Have a Medical Advance Directive?: No Would patient like information  on creating a medical advance directive?: No - Patient declined          Disposition: TTS discussed case with [place] [facility], NP, who recommends that pt be discharged. Tamika recommended [facility] outpt IOp program.  TTS spoke with pt about getting his injection at [facility] and the IOP program and pt was agreeable.  TTS also spoke with sister about these  things and she will help pursue them as well.      This service was provided via telemedicine using a 2-way, interactive audio and video technology.  Names of all persons participating in this telemedicine service and their role in this encounter. Name: Quency Tober Role: patient  Name: Lurline Idol, LCSW Role: TTS  Name:  Role:   Name:  Role:     Joanne Chars 10/05/2018 11:14 AM

## 2018-10-05 NOTE — ED Notes (Signed)
TTS being done pt eating breakfast

## 2018-10-05 NOTE — ED Notes (Signed)
Pt having TTS consult 

## 2018-10-05 NOTE — ED Provider Notes (Signed)
TIME SEEN: 4:00 AM  CHIEF COMPLAINT: Suicidal thoughts  HPI: Patient is a 48 year old male with history of alcohol withdrawal seizures, alcohol abuse, PTSD, schizoaffective disorder who presents to the emergency department with [facility] Department after his sister called for concerns for suicidal thoughts.  Patient reports a plan to overdose.  Has had suicidal thoughts for several days.  No HI or hallucinations.  Denies any pain at this time.  No fevers, cough, vomiting or diarrhea.  ROS: See HPI Constitutional: no fever  Eyes: no drainage  ENT: no runny nose   Cardiovascular:  no chest pain  Resp: no SOB  GI: no vomiting GU: no dysuria Integumentary: no rash  Allergy: no hives  Musculoskeletal: no leg swelling  Neurological: no slurred speech ROS otherwise negative  PAST MEDICAL HISTORY/PAST SURGICAL HISTORY:  Past Medical History:  Diagnosis Date  . Alcohol abuse   . Anxiety    at age 49  . Depression    at age 61  . Psychosis ([place])   . PTSD (post-traumatic stress disorder)   . Schizoaffective disorder   . Seizure disorder ([place])    related to etoh seizure    MEDICATIONS:  Prior to Admission medications   Medication Sig Start Date End Date Taking? Authorizing Provider  albuterol (PROVENTIL HFA;VENTOLIN HFA) 108 (90 Base) MCG/ACT inhaler Inhale 2 puffs into the lungs every 6 (six) hours as needed for wheezing or shortness of breath. 06/30/18   Scot Jun, FNP  allopurinol (ZYLOPRIM) 100 MG tablet Take 2 tablets (200 mg total) by mouth daily. 07/02/18 09/30/18  Scot Jun, FNP  chlordiazePOXIDE (LIBRIUM) 25 MG capsule 50mg  PO TID x 1D, then 25-50mg  PO BID X 1D, then 25-50mg  PO QD X 1D 06/28/18   Lawyer, Harrell Gave, PA-C  LORazepam (ATIVAN) 1 MG tablet Take 1 tablet (1 mg total) by mouth 3 (three) times daily as needed for anxiety. 06/28/18   Lawyer, Harrell Gave, PA-C  metoprolol tartrate (LOPRESSOR) 25 MG tablet Take 0.5 tablets (12.5 mg total) by  mouth 2 (two) times daily. 06/30/18   Scot Jun, FNP  paliperidone (INVEGA SUSTENNA) 156 MG/ML SUSY injection Inject 1 mL (156 mg total) into the muscle every 30 (thirty) days. Last administration 06/07/18. Next administration due 07/07/18. 07/07/18   [name], MD  pantoprazole (PROTONIX) 40 MG tablet Take 1 tablet (40 mg total) by mouth daily. 06/30/18   Scot Jun, FNP  sertraline (ZOLOFT) 25 MG tablet Take 3 tablets (75 mg total) by mouth daily. 06/08/18   [name], MD    ALLERGIES:  Allergies  Allergen Reactions  . Wellbutrin [Bupropion] Other (See Comments)    Reaction:  Seizure     SOCIAL HISTORY:  Social History   Tobacco Use  . Smoking status: Current Every Day Smoker    Packs/day: 1.00    Years: 24.00    Pack years: 24.00    Types: Cigarettes  . Smokeless tobacco: Never Used  Substance Use Topics  . Alcohol use: Yes    Alcohol/week: 0.0 standard drinks    Frequency: Never    Comment: drinking daily     FAMILY HISTORY: Family History  Problem Relation Age of Onset  . Alcoholism Mother   . CAD Father   . Depression Brother     EXAM: BP 120/83 (BP Location: Right Arm)   Pulse 81   Temp 98.1 F (36.7 C) (Oral)   Resp 18   SpO2 95%  CONSTITUTIONAL: Alert and oriented  and responds appropriately to questions. Well-appearing; well-nourished HEAD: Normocephalic EYES: Conjunctivae clear, pupils appear equal, EOMI ENT: normal nose; moist mucous membranes NECK: Supple, no meningismus, no nuchal rigidity, no LAD  CARD: RRR; S1 and S2 appreciated; no murmurs, no clicks, no rubs, no gallops RESP: Normal chest excursion without splinting or tachypnea; breath sounds clear and equal bilaterally; no wheezes, no rhonchi, no rales, no hypoxia or respiratory distress, speaking full sentences ABD/GI: Normal bowel sounds; non-distended; soft, non-tender, no rebound, no guarding, no peritoneal signs, no hepatosplenomegaly BACK:   The back appears normal and is non-tender to palpation, there is no CVA tenderness EXT: Normal ROM in all joints; non-tender to palpation; no edema; normal capillary refill; no cyanosis, no calf tenderness or swelling    SKIN: Normal color for age and race; warm; no rash NEURO: Moves all extremities equally PSYCH: Endorses SI with plan to overdose.  No HI or hallucinations.  MEDICAL DECISION MAKING: Patient here with suicidal thoughts.  Denies any medical complaints.  Alcohol level is 177.  Urine drug screen positive for THC.  Does not appear to be withdrawing at this time but will place him on CIWA protocol.  Will consult TTS.  Medically cleared currently.  ED PROGRESS: Awaiting TTS evaluation for further disposition.  I reviewed all nursing notes, vitals, pertinent previous records, EKGs, lab and urine results, imaging (as available).     Jewels Langone, Delice Bison, DO 10/05/18 [phone]

## 2018-10-06 ENCOUNTER — Telehealth (HOSPITAL_COMMUNITY): Payer: Self-pay | Admitting: Psychiatry

## 2018-12-14 ENCOUNTER — Other Ambulatory Visit: Payer: Self-pay | Admitting: Family Medicine

## 2018-12-29 ENCOUNTER — Telehealth: Payer: Medicare Other | Admitting: Family Medicine

## 2019-01-10 ENCOUNTER — Other Ambulatory Visit: Payer: Self-pay

## 2019-01-10 ENCOUNTER — Inpatient Hospital Stay (HOSPITAL_COMMUNITY)
Admission: EM | Admit: 2019-01-10 | Discharge: 2019-01-15 | DRG: 897 | Disposition: A | Discharge status: Home Health | Payer: Medicare Other | Attending: Family Medicine | Admitting: Family Medicine

## 2019-01-10 ENCOUNTER — Encounter (HOSPITAL_COMMUNITY): Payer: Self-pay

## 2019-01-10 ENCOUNTER — Emergency Department (HOSPITAL_COMMUNITY): Payer: Medicare Other

## 2019-01-10 DIAGNOSIS — F10129 Alcohol abuse with intoxication, unspecified: Secondary | ICD-10-CM

## 2019-01-10 DIAGNOSIS — Y908 Blood alcohol level of 240 mg/100 ml or more: Secondary | ICD-10-CM | POA: Diagnosis present

## 2019-01-10 DIAGNOSIS — E860 Dehydration: Secondary | ICD-10-CM | POA: Diagnosis present

## 2019-01-10 DIAGNOSIS — M542 Cervicalgia: Secondary | ICD-10-CM | POA: Diagnosis not present

## 2019-01-10 DIAGNOSIS — E8729 Other acidosis: Secondary | ICD-10-CM

## 2019-01-10 DIAGNOSIS — F10239 Alcohol dependence with withdrawal, unspecified: Secondary | ICD-10-CM | POA: Diagnosis present

## 2019-01-10 DIAGNOSIS — R7401 Elevation of levels of liver transaminase levels: Secondary | ICD-10-CM

## 2019-01-10 DIAGNOSIS — F10230 Alcohol dependence with withdrawal, uncomplicated: Secondary | ICD-10-CM | POA: Diagnosis not present

## 2019-01-10 DIAGNOSIS — Z811 Family history of alcohol abuse and dependence: Secondary | ICD-10-CM

## 2019-01-10 DIAGNOSIS — Z8249 Family history of ischemic heart disease and other diseases of the circulatory system: Secondary | ICD-10-CM | POA: Diagnosis not present

## 2019-01-10 DIAGNOSIS — D696 Thrombocytopenia, unspecified: Secondary | ICD-10-CM | POA: Diagnosis present

## 2019-01-10 DIAGNOSIS — E871 Hypo-osmolality and hyponatremia: Secondary | ICD-10-CM | POA: Diagnosis not present

## 2019-01-10 DIAGNOSIS — F251 Schizoaffective disorder, depressive type: Secondary | ICD-10-CM | POA: Diagnosis present

## 2019-01-10 DIAGNOSIS — F10229 Alcohol dependence with intoxication, unspecified: Secondary | ICD-10-CM | POA: Diagnosis not present

## 2019-01-10 DIAGNOSIS — S0101XA Laceration without foreign body of scalp, initial encounter: Secondary | ICD-10-CM

## 2019-01-10 DIAGNOSIS — E872 Acidosis: Secondary | ICD-10-CM | POA: Diagnosis present

## 2019-01-10 DIAGNOSIS — F17210 Nicotine dependence, cigarettes, uncomplicated: Secondary | ICD-10-CM | POA: Diagnosis not present

## 2019-01-10 DIAGNOSIS — G40909 Epilepsy, unspecified, not intractable, without status epilepticus: Secondary | ICD-10-CM | POA: Diagnosis present

## 2019-01-10 DIAGNOSIS — R7989 Other specified abnormal findings of blood chemistry: Secondary | ICD-10-CM

## 2019-01-10 DIAGNOSIS — I10 Essential (primary) hypertension: Secondary | ICD-10-CM | POA: Diagnosis present

## 2019-01-10 DIAGNOSIS — Z79899 Other long term (current) drug therapy: Secondary | ICD-10-CM

## 2019-01-10 DIAGNOSIS — R945 Abnormal results of liver function studies: Secondary | ICD-10-CM | POA: Diagnosis not present

## 2019-01-10 DIAGNOSIS — W19XXXA Unspecified fall, initial encounter: Secondary | ICD-10-CM | POA: Diagnosis not present

## 2019-01-10 DIAGNOSIS — F10929 Alcohol use, unspecified with intoxication, unspecified: Secondary | ICD-10-CM

## 2019-01-10 DIAGNOSIS — Z818 Family history of other mental and behavioral disorders: Secondary | ICD-10-CM | POA: Diagnosis not present

## 2019-01-10 DIAGNOSIS — K709 Alcoholic liver disease, unspecified: Secondary | ICD-10-CM | POA: Diagnosis present

## 2019-01-10 DIAGNOSIS — S199XXA Unspecified injury of neck, initial encounter: Secondary | ICD-10-CM | POA: Diagnosis not present

## 2019-01-10 DIAGNOSIS — W010XXA Fall on same level from slipping, tripping and stumbling without subsequent striking against object, initial encounter: Secondary | ICD-10-CM | POA: Diagnosis present

## 2019-01-10 DIAGNOSIS — E876 Hypokalemia: Secondary | ICD-10-CM | POA: Diagnosis not present

## 2019-01-10 DIAGNOSIS — Z716 Tobacco abuse counseling: Secondary | ICD-10-CM | POA: Diagnosis not present

## 2019-01-10 DIAGNOSIS — F250 Schizoaffective disorder, bipolar type: Secondary | ICD-10-CM | POA: Diagnosis not present

## 2019-01-10 DIAGNOSIS — Z888 Allergy status to other drugs, medicaments and biological substances status: Secondary | ICD-10-CM

## 2019-01-10 DIAGNOSIS — Z1159 Encounter for screening for other viral diseases: Secondary | ICD-10-CM | POA: Diagnosis not present

## 2019-01-10 DIAGNOSIS — K219 Gastro-esophageal reflux disease without esophagitis: Secondary | ICD-10-CM | POA: Diagnosis present

## 2019-01-10 DIAGNOSIS — R740 Nonspecific elevation of levels of transaminase and lactic acid dehydrogenase [LDH]: Secondary | ICD-10-CM | POA: Diagnosis not present

## 2019-01-10 DIAGNOSIS — M109 Gout, unspecified: Secondary | ICD-10-CM | POA: Diagnosis present

## 2019-01-10 DIAGNOSIS — F10939 Alcohol use, unspecified with withdrawal, unspecified: Secondary | ICD-10-CM

## 2019-01-10 DIAGNOSIS — Z23 Encounter for immunization: Secondary | ICD-10-CM

## 2019-01-10 LAB — CBC WITH DIFFERENTIAL/PLATELET
Abs Immature Granulocytes: 0.02 10*3/uL (ref 0.00–0.07)
Basophils Absolute: 0 10*3/uL (ref 0.0–0.1)
Basophils Relative: 0 %
Eosinophils Absolute: 0 10*3/uL (ref 0.0–0.5)
Eosinophils Relative: 0 %
HCT: 38 % — ABNORMAL LOW (ref 39.0–52.0)
Hemoglobin: 13.9 g/dL (ref 13.0–17.0)
Immature Granulocytes: 1 %
Lymphocytes Relative: 28 %
Lymphs Abs: 1.1 10*3/uL (ref 0.7–4.0)
MCH: 33.3 pg (ref 26.0–34.0)
MCHC: 36.6 g/dL — ABNORMAL HIGH (ref 30.0–36.0)
MCV: 91.1 fL (ref 80.0–100.0)
Monocytes Absolute: 0.5 10*3/uL (ref 0.1–1.0)
Monocytes Relative: 13 %
Neutro Abs: 2.4 10*3/uL (ref 1.7–7.7)
Neutrophils Relative %: 58 %
Platelets: 149 10*3/uL — ABNORMAL LOW (ref 150–400)
RBC: 4.17 MIL/uL — ABNORMAL LOW (ref 4.22–5.81)
RDW: 12.5 % (ref 11.5–15.5)
WBC: 4 10*3/uL (ref 4.0–10.5)
nRBC: 0 % (ref 0.0–0.2)

## 2019-01-10 LAB — BASIC METABOLIC PANEL
Anion gap: 20 — ABNORMAL HIGH (ref 5–15)
BUN: <5 mg/dL — ABNORMAL LOW (ref 6–20)
CO2: 24 mmol/L (ref 22–32)
Calcium: 8 mg/dL — ABNORMAL LOW (ref 8.9–10.3)
Chloride: 76 mmol/L — ABNORMAL LOW (ref 98–111)
Creatinine, Ser: 0.52 mg/dL — ABNORMAL LOW (ref 0.61–1.24)
GFR calc Af Amer: >60 mL/min (ref >60)
GFR calc non Af Amer: >60 mL/min (ref >60)
Glucose, Bld: 113 mg/dL — ABNORMAL HIGH (ref 70–99)
Potassium: 2.2 mmol/L — CL (ref 3.5–5.1)
Sodium: 120 mmol/L — ABNORMAL LOW (ref 135–145)

## 2019-01-10 LAB — COMPREHENSIVE METABOLIC PANEL
ALT: 369 U/L — ABNORMAL HIGH (ref 0–44)
AST: 619 U/L — ABNORMAL HIGH (ref 15–41)
Albumin: 3.9 g/dL (ref 3.5–5.0)
Alkaline Phosphatase: 99 U/L (ref 38–126)
Anion gap: 18 — ABNORMAL HIGH (ref 5–15)
BUN: <5 mg/dL — ABNORMAL LOW (ref 6–20)
CO2: 29 mmol/L (ref 22–32)
Calcium: 8.7 mg/dL — ABNORMAL LOW (ref 8.9–10.3)
Chloride: 71 mmol/L — ABNORMAL LOW (ref 98–111)
Creatinine, Ser: 0.71 mg/dL (ref 0.61–1.24)
GFR calc Af Amer: >60 mL/min (ref >60)
GFR calc non Af Amer: >60 mL/min (ref >60)
Glucose, Bld: 141 mg/dL — ABNORMAL HIGH (ref 70–99)
Potassium: 2.5 mmol/L — CL (ref 3.5–5.1)
Sodium: 118 mmol/L — CL (ref 135–145)
Total Bilirubin: 1.1 mg/dL (ref 0.3–1.2)
Total Protein: 6.7 g/dL (ref 6.5–8.1)

## 2019-01-10 LAB — ETHANOL: Alcohol, Ethyl (B): 252 mg/dL — ABNORMAL HIGH (ref <10)

## 2019-01-10 LAB — PROTIME-INR
INR: 0.9 (ref 0.8–1.2)
Prothrombin Time: 12.1 seconds (ref 11.4–15.2)

## 2019-01-10 LAB — PHOSPHORUS: Phosphorus: 3.1 mg/dL (ref 2.5–4.6)

## 2019-01-10 LAB — SARS CORONAVIRUS 2 BY RT PCR (HOSPITAL ORDER, PERFORMED IN ~~LOC~~ HOSPITAL LAB): SARS Coronavirus 2: NEGATIVE

## 2019-01-10 LAB — MAGNESIUM: Magnesium: 1.5 mg/dL — ABNORMAL LOW (ref 1.7–2.4)

## 2019-01-10 MED ORDER — HYDROGEN PEROXIDE 3 % EX SOLN
CUTANEOUS | Status: AC
  Administered 2019-01-10: 20:00:00
  Filled 2019-01-10: qty 473

## 2019-01-10 MED ORDER — ALBUTEROL SULFATE (2.5 MG/3ML) 0.083% IN NEBU: 3.0000 mL | INHALATION_SOLUTION | Freq: Four times a day (QID) | RESPIRATORY_TRACT | Status: DC | PRN

## 2019-01-10 MED ORDER — PANTOPRAZOLE SODIUM 40 MG PO TBEC
40.0000 mg | DELAYED_RELEASE_TABLET | Freq: Every day | ORAL | Status: DC
  Administered 2019-01-11 – 2019-01-15 (×5): 40 mg via ORAL
  Filled 2019-01-10 (×5): qty 1

## 2019-01-10 MED ORDER — ONDANSETRON HCL 4 MG PO TABS: 4.0000 mg | ORAL_TABLET | Freq: Four times a day (QID) | ORAL | Status: DC | PRN

## 2019-01-10 MED ORDER — HYDRALAZINE HCL 20 MG/ML IJ SOLN: 5.0000 mg | INTRAMUSCULAR | Status: DC | PRN

## 2019-01-10 MED ORDER — POTASSIUM CHLORIDE CRYS ER 20 MEQ PO TBCR
40.0000 meq | EXTENDED_RELEASE_TABLET | Freq: Once | ORAL | Status: AC
  Administered 2019-01-10: 23:00:00 40 meq via ORAL
  Filled 2019-01-10: qty 2

## 2019-01-10 MED ORDER — TETANUS-DIPHTH-ACELL PERTUSSIS 5-2.5-18.5 LF-MCG/0.5 IM SUSP
0.5000 mL | Freq: Once | INTRAMUSCULAR | Status: AC
  Administered 2019-01-10: 0.5 mL via INTRAMUSCULAR
  Filled 2019-01-10: qty 0.5

## 2019-01-10 MED ORDER — LIDOCAINE-EPINEPHRINE 2 %-1:100000 IJ SOLN
20.0000 mL | Freq: Once | INTRAMUSCULAR | Status: AC
  Administered 2019-01-10: 20:00:00 20 mL via INTRADERMAL
  Filled 2019-01-10: qty 1

## 2019-01-10 MED ORDER — THIAMINE HCL 100 MG/ML IJ SOLN
100.0000 mg | Freq: Every day | INTRAMUSCULAR | Status: DC
  Administered 2019-01-10: 22:00:00 100 mg via INTRAVENOUS
  Filled 2019-01-10 (×2): qty 2

## 2019-01-10 MED ORDER — NICOTINE 21 MG/24HR TD PT24
21.0000 mg | MEDICATED_PATCH | Freq: Every day | TRANSDERMAL | Status: DC
  Administered 2019-01-11 – 2019-01-15 (×5): 21 mg via TRANSDERMAL
  Filled 2019-01-10 (×5): qty 1

## 2019-01-10 MED ORDER — LORAZEPAM 2 MG/ML IJ SOLN
0.0000 mg | Freq: Four times a day (QID) | INTRAMUSCULAR | Status: DC
  Administered 2019-01-10 – 2019-01-12 (×6): 2 mg via INTRAVENOUS
  Filled 2019-01-10 (×7): qty 1

## 2019-01-10 MED ORDER — LORAZEPAM 1 MG PO TABS: 0.0000 mg | ORAL_TABLET | Freq: Four times a day (QID) | ORAL | Status: DC

## 2019-01-10 MED ORDER — LORAZEPAM 1 MG PO TABS: 0.0000 mg | ORAL_TABLET | Freq: Two times a day (BID) | ORAL | Status: DC

## 2019-01-10 MED ORDER — SODIUM CHLORIDE 0.9 % IV SOLN
INTRAVENOUS | Status: DC
  Administered 2019-01-10 – 2019-01-15 (×8): via INTRAVENOUS

## 2019-01-10 MED ORDER — LORAZEPAM 2 MG/ML IJ SOLN: 0.0000 mg | Freq: Two times a day (BID) | INTRAMUSCULAR | Status: DC

## 2019-01-10 MED ORDER — ONDANSETRON HCL 4 MG/2ML IJ SOLN: 4.0000 mg | Freq: Four times a day (QID) | INTRAMUSCULAR | Status: DC | PRN

## 2019-01-10 MED ORDER — POTASSIUM CHLORIDE CRYS ER 20 MEQ PO TBCR
40.0000 meq | EXTENDED_RELEASE_TABLET | Freq: Once | ORAL | Status: AC
  Administered 2019-01-11: 01:00:00 40 meq via ORAL
  Filled 2019-01-10: qty 2

## 2019-01-10 MED ORDER — POTASSIUM CHLORIDE 10 MEQ/100ML IV SOLN
10.0000 meq | INTRAVENOUS | Status: AC
  Administered 2019-01-10 – 2019-01-11 (×3): 10 meq via INTRAVENOUS
  Filled 2019-01-10 (×3): qty 100

## 2019-01-10 MED ORDER — ALLOPURINOL 100 MG PO TABS
200.0000 mg | ORAL_TABLET | Freq: Every day | ORAL | Status: DC
  Administered 2019-01-11 – 2019-01-15 (×5): 200 mg via ORAL
  Filled 2019-01-10 (×5): qty 2

## 2019-01-10 MED ORDER — METOPROLOL TARTRATE 25 MG PO TABS
12.5000 mg | ORAL_TABLET | Freq: Two times a day (BID) | ORAL | Status: DC
  Administered 2019-01-10: 23:00:00 12.5 mg via ORAL
  Filled 2019-01-10: qty 1

## 2019-01-10 MED ORDER — IBUPROFEN 200 MG PO TABS
400.0000 mg | ORAL_TABLET | Freq: Four times a day (QID) | ORAL | Status: DC | PRN
  Administered 2019-01-11 – 2019-01-12 (×4): 400 mg via ORAL
  Filled 2019-01-10 (×4): qty 2

## 2019-01-10 MED ORDER — SERTRALINE HCL 50 MG PO TABS
75.0000 mg | ORAL_TABLET | Freq: Every day | ORAL | Status: DC
  Administered 2019-01-11 – 2019-01-15 (×5): 75 mg via ORAL
  Filled 2019-01-10 (×5): qty 2

## 2019-01-10 MED ORDER — VITAMIN B-1 100 MG PO TABS
100.0000 mg | ORAL_TABLET | Freq: Every day | ORAL | Status: DC
  Administered 2019-01-11 – 2019-01-12 (×2): 100 mg via ORAL
  Filled 2019-01-10 (×2): qty 1

## 2019-01-10 MED ORDER — MAGNESIUM SULFATE 2 GM/50ML IV SOLN
2.0000 g | INTRAVENOUS | Status: AC
  Administered 2019-01-10: 23:00:00 2 g via INTRAVENOUS
  Filled 2019-01-10: qty 50

## 2019-01-10 MED ORDER — FAMOTIDINE IN NACL 20-0.9 MG/50ML-% IV SOLN
20.0000 mg | Freq: Once | INTRAVENOUS | Status: AC
  Administered 2019-01-11: 20 mg via INTRAVENOUS
  Filled 2019-01-10: qty 50

## 2019-01-10 NOTE — ED Notes (Signed)
Date and time results received: 01/10/19 2118   (use smartphrase ".now" to insert current time)  Test: sodium and potassium  Critical Value: sodium 118 and potassium 2.5  Name of Provider Notified: Margarita Mail PA  Orders Received? Or Actions Taken?: monitor

## 2019-01-10 NOTE — ED Notes (Signed)
ED TO INPATIENT HANDOFF REPORT  Name/Age/Gender [name] 48 y.o. male  Code Status    Code Status Orders  (From admission, onward)         Start     Ordered   01/10/19 2227  Full code  Continuous     01/10/19 2229        Code Status History    Date Active Date Inactive Code Status Order ID Comments User Context   10/05/2018 0402 10/05/2018 1755 Full Code [id number]  Ward, Delice Bison, DO ED   06/02/2018 1914 06/08/2018 1626 Full Code [id number]  Suella Broad, [place] Inpatient   06/01/2018 0708 06/02/2018 1825 Full Code [id number]  Ripley Fraise, MD ED   02/22/2016 1928 02/23/2016 1510 Full Code [id number]  Patrecia Pour, NP Inpatient   10/10/2015 1408 10/11/2015 1436 Full Code [id number]  Leonard Schwartz, MD ED   09/30/2015 1656 10/04/2015 2154 Full Code [id number]  Debbe Odea, MD ED   06/29/2015 1426 06/30/2015 1902 Full Code [id number]  Kerrie Buffalo, NP Inpatient   06/28/2015 2339 06/29/2015 0928 Full Code [id number]  Antonietta Breach, PA-C ED   06/17/2015 1237 06/19/2015 1614 Full Code [id number]  Lurena Nida, NP Inpatient   06/16/2015 1633 06/17/2015 1237 Full Code [id number]  Merrily Pew, MD ED   05/13/2015 1353 05/14/2015 1700 Full Code [id number]  Niel Hummer, NP Inpatient   05/13/2015 0727 05/13/2015 1353 Full Code [id number]  Lacretia Leigh, MD ED   05/05/2015 1932 05/06/2015 1552 Full Code [id number]  Patrecia Pour, NP Inpatient   05/05/2015 1852 05/05/2015 1932 Full Code [id number]  Delfin Gant, NP Inpatient   05/05/2015 0233 05/05/2015 1852 Full Code [id number]  Alvina Chou, PA-C ED   04/23/2015 1814 04/25/2015 1735 Full Code [id number]  Junius Creamer, NP ED   04/08/2015 0737 04/09/2015 2242 Full Code [id number]  Dorie Rank, MD ED   02/28/2015 1530 03/01/2015 1708 Full Code [id number]  Benjamine Mola, [place] Inpatient   02/28/2015 0544 02/28/2015 1530 Full Code [id number]  Charlann Lange, PA-C ED   02/03/2015 1321 02/09/2015 2125 Full Code [id number]  Encarnacion Slates, NP  Inpatient   02/03/2015 0111 02/03/2015 1321 Full Code [id number]  Antonietta Breach, PA-C ED   12/25/2014 1638 12/30/2014 1725 Full Code [id number]  Benjamine Mola, [place] Inpatient   12/25/2014 1357 12/25/2014 1638 Full Code [id number]  Benjamine Mola, [place] Inpatient   12/24/2014 0751 12/25/2014 0429 Full Code [id number]  Carlisle Cater, PA-C ED   11/29/2014 1743 12/06/2014 1859 Full Code [id number]  Patrecia Pour, NP Inpatient   11/28/2014 1142 11/29/2014 1743 Full Code [id number]  Margarita Mail, PA-C ED   11/03/2014 2146 11/06/2014 2004 Full Code [id number]  Deneise Lever, MD ED   10/24/2014 2142 10/30/2014 1814 Full Code [id number]  Laverle Hobby, PA-C Inpatient   10/19/2014 0618 10/24/2014 2142 Full Code [id number]  Toy Baker, MD ED   09/11/2014 2139 09/12/2014 1922 Full Code [id number]  Antonietta Breach, PA-C ED   07/06/2014 1728 07/07/2014 1707 Full Code [id number]  Clayton Bibles, PA-C ED   05/04/2014 1303 05/05/2014 1653 Full Code [id number]  Clarene Reamer, MD Inpatient   02/11/2014 1833 02/16/2014 1656 Full Code [id number]  Delfin Gant, NP Inpatient   02/09/2014 1519 02/11/2014 1833 Full Code [id number]  Benjamine Mola, [place] Inpatient   02/09/2014 1323 02/09/2014 1519 Full Code [id number]  Benjamine Mola, [place] ED   02/09/2014 0143 02/09/2014 1323 Full Code [id number]  Dammen, Ruthell Rummage,  PA-C ED   01/05/2014 0906 01/05/2014 1703 Full Code [id number]  Corena Pilgrim Inpatient   12/29/2013 1951 12/29/2013 2301 Full Code [id number]  Malena Peer, NP ED   12/13/2013 0324 12/13/2013 1003 Full Code [id number]  Domenic Moras, PA-C ED   12/02/2011 2322 12/08/2011 2325 Full Code [id number]  Hoy Morn, MD ED   11/25/2011 0151 11/25/2011 1451 Full Code [id number]  Abran Richard, PA-C ED   10/30/2011 0012 10/30/2011 0810 Full Code [id number]  Alfonzo Feller, DO ED   06/30/2011 1826 07/16/2011 1418 Full Code [id number]  Paulla Fore, RN Inpatient      Home/SNF/Other Home  Chief Complaint fall,laceration  Level of  Care/Admitting Diagnosis ED Disposition    ED Disposition Condition Comment   Admit  Hospital Area: [facility] [[id number]]  Level of Care: Telemetry [5]  Admit to tele based on following criteria: Other see comments  Comments: Hypokalemia  Covid Evaluation: Screening Protocol (No Symptoms)  Diagnosis: Alcohol intoxication [facility]) [144315]  Admitting Physician: Virgel Manifold  Attending Physician: Ivor Costa [phone]  Estimated length of stay: past midnight tomorrow  Certification:: I certify this patient will need inpatient services for at least 2 midnights  PT Class (Do Not Modify): Inpatient [101]  PT Acc Code (Do Not Modify): Private [1]       Medical History Past Medical History:  Diagnosis Date  . Alcohol abuse   . Anxiety    at age 32  . Depression    at age 61  . Psychosis ([place])   . PTSD (post-traumatic stress disorder)   . Schizoaffective disorder   . Seizure disorder ([place])    related to etoh seizure    Allergies Allergies  Allergen Reactions  . Wellbutrin [Bupropion] Other (See Comments)    Reaction:  Seizure     IV Location/Drains/Wounds Patient Lines/Drains/Airways Status   Active Line/Drains/Airways    Name:   Placement date:   Placement time:   Site:   Days:   Peripheral IV 01/10/19 Left Hand   01/10/19    2033    Hand   less than 1          Labs/Imaging Results for orders placed or performed during the hospital encounter of 01/10/19 (from the past 48 hour(s))  CBC with Differential     Status: Abnormal   Collection Time: 01/10/19  8:23 PM  Result Value Ref Range   WBC 4.0 4.0 - 10.5 K/uL   RBC 4.17 (L) 4.22 - 5.81 MIL/uL   Hemoglobin 13.9 13.0 - 17.0 g/dL   HCT 38.0 (L) 39.0 - 52.0 %   MCV 91.1 80.0 - 100.0 fL   MCH 33.3 26.0 - 34.0 pg   MCHC 36.6 (H) 30.0 - 36.0 g/dL   RDW 12.5 11.5 - 15.5 %   Platelets 149 (L) 150 - 400 K/uL   nRBC 0.0 0.0 - 0.2 %   Neutrophils Relative % 58 %   Neutro Abs 2.4 1.7 - 7.7 K/uL    Lymphocytes Relative 28 %   Lymphs Abs 1.1 0.7 - 4.0 K/uL   Monocytes Relative 13 %   Monocytes Absolute 0.5 0.1 - 1.0 K/uL   Eosinophils Relative 0 %   Eosinophils Absolute 0.0 0.0 - 0.5 K/uL   Basophils Relative 0 %   Basophils Absolute 0.0 0.0 - 0.1 K/uL   Immature Granulocytes 1 %   Abs Immature Granulocytes 0.02 0.00 - 0.07 K/uL    Comment: Performed at [facility]  [place] [address], [place], [place] [address]  Comprehensive metabolic panel     Status: Abnormal   Collection Time: 01/10/19  8:23 PM  Result Value Ref Range   Sodium 118 (LL) 135 - 145 mmol/L    Comment: CRITICAL RESULT CALLED TO, READ BACK BY AND VERIFIED WITH: R IDOWU RN 2117 05.25.20 A NAVARRO    Potassium 2.5 (LL) 3.5 - 5.1 mmol/L    Comment: CRITICAL RESULT CALLED TO, READ BACK BY AND VERIFIED WITH: R IDOWU RN 2117 01/10/19 A NAVARRO    Chloride 71 (L) 98 - 111 mmol/L   CO2 29 22 - 32 mmol/L   Glucose, Bld 141 (H) 70 - 99 mg/dL   BUN <5 (L) 6 - 20 mg/dL   Creatinine, Ser 0.71 0.61 - 1.24 mg/dL   Calcium 8.7 (L) 8.9 - 10.3 mg/dL   Total Protein 6.7 6.5 - 8.1 g/dL   Albumin 3.9 3.5 - 5.0 g/dL   AST 619 (H) 15 - 41 U/L   ALT 369 (H) 0 - 44 U/L   Alkaline Phosphatase 99 38 - 126 U/L   Total Bilirubin 1.1 0.3 - 1.2 mg/dL   GFR calc non Af Amer >60 >60 mL/min   GFR calc Af Amer >60 >60 mL/min   Anion gap 18 (H) 5 - 15    Comment: Performed at [facility], [place] [address], [place], [place] [address]  Ethanol     Status: Abnormal   Collection Time: 01/10/19  8:23 PM  Result Value Ref Range   Alcohol, Ethyl (B) 252 (H) <10 mg/dL    Comment: (NOTE) Lowest detectable limit for serum alcohol is 10 mg/dL. For medical purposes only. Performed at [facility], [place] [address], [place], [place] [address]    Ct Head Wo Contrast  Result Date: 01/10/2019 CLINICAL DATA:  Pain status post fall. Lacerations to temporal region. EXAM: CT HEAD WITHOUT CONTRAST CT  CERVICAL SPINE WITHOUT CONTRAST TECHNIQUE: Multidetector CT imaging of the head and cervical spine was performed following the standard protocol without intravenous contrast. Multiplanar CT image reconstructions of the cervical spine were also generated. COMPARISON:  04/23/2015 FINDINGS: CT HEAD FINDINGS Brain: No evidence of acute infarction, hemorrhage, hydrocephalus, extra-axial collection or mass lesion/mass effect. Vascular: No hyperdense vessel or unexpected calcification. Skull: Normal. Negative for fracture or focal lesion. There is right frontal and temporal scalp swelling without evidence of an underlying fracture. There is some subcutaneous gas likely related to a laceration in this region. Sinuses/Orbits: No acute finding. Mucosal thickening of the right maxillary sinus is noted. Other: None. CT CERVICAL SPINE FINDINGS Alignment: There is straightening of the normal cervical lordotic curvature which is nonspecific. Skull base and vertebrae: No acute fracture. No primary bone lesion or focal pathologic process. Soft tissues and spinal canal: No prevertebral fluid or swelling. No visible canal hematoma. Disc levels:  The disc heights are relatively well maintained. Upper chest: Negative. Other: None IMPRESSION: 1. No acute intracranial abnormality detected. 2. No acute cervical spine fracture. 3. Right temporal scalp swelling and lacerations without evidence of underlying calvarial defect. Electronically Signed   By: Constance Holster M.D.   On: 01/10/2019 21:31   Ct Cervical Spine Wo Contrast  Result Date: 01/10/2019 CLINICAL DATA:  Pain status post fall. Lacerations to temporal region. EXAM: CT HEAD WITHOUT CONTRAST CT CERVICAL SPINE WITHOUT CONTRAST TECHNIQUE: Multidetector CT imaging of the head and cervical spine was performed following the standard protocol without intravenous  contrast. Multiplanar CT image reconstructions of the cervical spine were also generated. COMPARISON:  04/23/2015  FINDINGS: CT HEAD FINDINGS Brain: No evidence of acute infarction, hemorrhage, hydrocephalus, extra-axial collection or mass lesion/mass effect. Vascular: No hyperdense vessel or unexpected calcification. Skull: Normal. Negative for fracture or focal lesion. There is right frontal and temporal scalp swelling without evidence of an underlying fracture. There is some subcutaneous gas likely related to a laceration in this region. Sinuses/Orbits: No acute finding. Mucosal thickening of the right maxillary sinus is noted. Other: None. CT CERVICAL SPINE FINDINGS Alignment: There is straightening of the normal cervical lordotic curvature which is nonspecific. Skull base and vertebrae: No acute fracture. No primary bone lesion or focal pathologic process. Soft tissues and spinal canal: No prevertebral fluid or swelling. No visible canal hematoma. Disc levels:  The disc heights are relatively well maintained. Upper chest: Negative. Other: None IMPRESSION: 1. No acute intracranial abnormality detected. 2. No acute cervical spine fracture. 3. Right temporal scalp swelling and lacerations without evidence of underlying calvarial defect. Electronically Signed   By: Constance Holster M.D.   On: 01/10/2019 21:31    Pending Labs Unresulted Labs (From admission, onward)    Start     Ordered   01/11/19 0500  TSH  Tomorrow morning,   R     01/10/19 2223   01/11/19 0500  CBC  Tomorrow morning,   R     01/10/19 2229   01/10/19 2243  SARS Coronavirus 2 [facility] order, Performed in [place] hospital lab)  Once,   R     01/10/19 2243   01/10/19 2241  Phosphorus  Add-on,   R     01/10/19 2240   01/10/19 4268  Basic metabolic panel  Now then every 6 hours,   R     01/10/19 2233   01/10/19 2226  HIV Antibody (routine testing w rflx)  Once,   R     01/10/19 2225   01/10/19 2225  Hepatitis panel, acute  Once,   R     01/10/19 2224   01/10/19 2225  Urine rapid drug screen (hosp performed)  ONCE - STAT,   R     01/10/19  2225   01/10/19 2224  Osmolality  Once,   R     01/10/19 2223   01/10/19 2224  Osmolality, urine  Once,   R     01/10/19 2223   01/10/19 2224  Creatinine, urine, random  Once,   R     01/10/19 2223   01/10/19 2224  Sodium, urine, random  Once,   R     01/10/19 2223   01/10/19 2215  Protime-INR  ONCE - STAT,   STAT     01/10/19 2215   01/10/19 2121  Magnesium  ONCE - STAT,   STAT     01/10/19 2120          Vitals/Pain Today's Vitals   01/10/19 2244 01/10/19 2247 01/10/19 2300 01/10/19 2301  BP: 111/67  (!) 125/98   Pulse: 73 71 93 90  Resp:  18 (!) 22 18  Temp:      TempSrc:      SpO2:  97% 97% 94%    Isolation Precautions No active isolations  Medications Medications  LORazepam (ATIVAN) injection 0-4 mg (2 mg Intravenous Given 01/10/19 2150)    Or  LORazepam (ATIVAN) tablet 0-4 mg ( Oral See Alternative 01/10/19 2150)  LORazepam (ATIVAN) injection 0-4 mg (has no administration in  time range)    Or  LORazepam (ATIVAN) tablet 0-4 mg (has no administration in time range)  thiamine (VITAMIN B-1) tablet 100 mg ( Oral See Alternative 01/10/19 2146)    Or  thiamine (B-1) injection 100 mg (100 mg Intravenous Given 01/10/19 2146)  magnesium sulfate IVPB 2 g 50 mL (2 g Intravenous New Bag/Given 01/10/19 2254)  potassium chloride 10 mEq in 100 mL IVPB (10 mEq Intravenous New Bag/Given 01/10/19 2248)  0.9 %  sodium chloride infusion ( Intravenous New Bag/Given 01/10/19 2246)  allopurinol (ZYLOPRIM) tablet 200 mg (has no administration in time range)  sertraline (ZOLOFT) tablet 75 mg (has no administration in time range)  pantoprazole (PROTONIX) EC tablet 40 mg (has no administration in time range)  albuterol (VENTOLIN HFA) 108 (90 Base) MCG/ACT inhaler 2 puff (has no administration in time range)  nicotine (NICODERM CQ - dosed in mg/24 hours) patch 21 mg (has no administration in time range)  ondansetron (ZOFRAN) tablet 4 mg (has no administration in time range)    Or  ondansetron  (ZOFRAN) injection 4 mg (has no administration in time range)  famotidine (PEPCID) IVPB 20 mg premix (has no administration in time range)  hydrALAZINE (APRESOLINE) injection 5 mg (has no administration in time range)  ibuprofen (ADVIL) tablet 400 mg (has no administration in time range)  lidocaine-EPINEPHrine (XYLOCAINE W/EPI) 2 %-1:100000 (with pres) injection 20 mL (20 mLs Intradermal Given by Other 01/10/19 2008)  hydrogen peroxide 3 % external solution (  Given by Other 01/10/19 2008)  Tdap (BOOSTRIX) injection 0.5 mL (0.5 mLs Intramuscular Given 01/10/19 2244)  potassium chloride SA (K-DUR) CR tablet 40 mEq (40 mEq Oral Given 01/10/19 2244)    Mobility walks

## 2019-01-10 NOTE — ED Triage Notes (Addendum)
Pt BIB EMS form middle of road. Pt tripped and fell twice and hit his head. Pt has lac on top of head. Pt has been drinking. Pt is in c-collar. Denies blood thinners and LOC. Hx of TBI and HTN. Pt is schizo-affective and has not taken medication.   BP 120/80 HR 80 98% CBG 124

## 2019-01-10 NOTE — ED Provider Notes (Signed)
[facility] DEPT Provider Note   CSN: [id number] Arrival date & time: 01/10/19  1840    History   Chief Complaint Chief Complaint  Patient presents with   Fall   Head Laceration   Alcohol Intoxication    HPI [name] is a 48 y.o. male with a past medical history of schizoaffective disorder, PTSD, chronic alcohol abuse and dependence who presents with alcohol intoxication and fall.  States that he drinks heavily every day and had about 20 beers today.  He is unable to tell me much more about his fall.  He is unsure of his last tetanus vaccination.     HPI  Past Medical History:  Diagnosis Date   Alcohol abuse    Anxiety    at age 59   Depression    at age 81   Psychosis [facility])    PTSD (post-traumatic stress disorder)    Schizoaffective disorder    Seizure disorder ([place])    related to etoh seizure    Patient Active Problem List   Diagnosis Date Noted   HTN (hypertension) 01/10/2019   Fall 01/10/2019   Alcohol intoxication ([place]) 01/10/2019   Scalp laceration    Schizoaffective disorder, bipolar type ([place]) 06/02/2018   Low back pain 08/21/2016   Alcohol dependence with uncomplicated withdrawal ([place]) 02/22/2016   Alcohol-induced mood disorder ([place]) 02/22/2016   Gout 10/22/2015   GERD (gastroesophageal reflux disease) 10/22/2015   Alcohol withdrawal seizure with complication ([place]) 46/27/0350   Schizoaffective disorder, depressive type ([place])    History of alcohol abuse    Tobacco dependence due to cigarettes 11/20/2014   Alcohol abuse with intoxication ([place])    Hyponatremia 10/18/2014   Abnormal LFTs    Post traumatic stress disorder (PTSD) 11/03/2011   Hypokalemia 07/02/2011    Past Surgical History:  Procedure Laterality Date   FOOT SURGERY     right   HAND SURGERY          Home Medications    Prior to Admission medications   Medication Sig Start Date End Date Taking? Authorizing  Provider  albuterol (PROVENTIL HFA;VENTOLIN HFA) 108 (90 Base) MCG/ACT inhaler Inhale 2 puffs into the lungs every 6 (six) hours as needed for wheezing or shortness of breath. 06/30/18   Scot Jun, FNP  allopurinol (ZYLOPRIM) 100 MG tablet Take 2 tablets (200 mg total) by mouth daily. 07/02/18 10/05/18  Scot Jun, FNP  metoprolol tartrate (LOPRESSOR) 25 MG tablet Take 0.5 tablets (12.5 mg total) by mouth 2 (two) times daily. 06/30/18   Scot Jun, FNP  paliperidone (INVEGA SUSTENNA) 156 MG/ML SUSY injection Inject 1 mL (156 mg total) into the muscle every 30 (thirty) days. Last administration 06/07/18. Next administration due 07/07/18. 07/07/18   [name], MD  pantoprazole (PROTONIX) 40 MG tablet Take 1 tablet (40 mg total) by mouth daily. 06/30/18   Scot Jun, FNP  sertraline (ZOLOFT) 50 MG tablet Take 75 mg by mouth daily.  12/14/18   [provider]    Family History Family History  Problem Relation Age of Onset   Alcoholism Mother    CAD Father    Depression Brother     Social History Social History   Tobacco Use   Smoking status: Current Every Day Smoker    Packs/day: 1.00    Years: 24.00    Pack years: 24.00    Types: Cigarettes   Smokeless tobacco: Never Used  Substance Use Topics  Alcohol use: Yes    Alcohol/week: 0.0 standard drinks    Frequency: Never    Comment: drinking daily    Drug use: Yes    Frequency: 1.0 times per week    Types: Marijuana    Comment: THC 2 to 3 times per month. last use 09/24/2015     Allergies   Wellbutrin [bupropion]   Review of Systems Review of Systems  Unable to perform ROS: Mental status change     Physical Exam Updated Vital Signs BP 110/73    Pulse 73    Temp 97.7 F (36.5 C)    Resp 16    SpO2 91%   Physical Exam Vitals signs and nursing note reviewed.  Constitutional:      General: He is not in acute distress.    Appearance: He is well-developed. He is  not diaphoretic.     Comments: C-collar in place  HENT:     Head: Normocephalic.     Comments: Triangular and stellate laceration of the right scalp with pulsatile arterial bleeding Eyes:     General: No scleral icterus.    Conjunctiva/sclera: Conjunctivae normal.  Neck:     Musculoskeletal: Normal range of motion and neck supple.  Cardiovascular:     Rate and Rhythm: Normal rate and regular rhythm.     Heart sounds: Normal heart sounds.  Pulmonary:     Effort: Pulmonary effort is normal. No respiratory distress.     Breath sounds: Normal breath sounds.  Abdominal:     Palpations: Abdomen is soft.     Tenderness: There is no abdominal tenderness.  Skin:    General: Skin is warm and dry.  Neurological:     Mental Status: He is alert.  Psychiatric:        Behavior: Behavior normal.      ED Treatments / Results  Labs (all labs ordered are listed, but only abnormal results are displayed) Labs Reviewed  CBC WITH DIFFERENTIAL/PLATELET - Abnormal; Notable for the following components:      Result Value   RBC 4.17 (*)    HCT 38.0 (*)    MCHC 36.6 (*)    Platelets 149 (*)    All other components within normal limits  COMPREHENSIVE METABOLIC PANEL - Abnormal; Notable for the following components:   Sodium 118 (*)    Potassium 2.5 (*)    Chloride 71 (*)    Glucose, Bld 141 (*)    BUN <5 (*)    Calcium 8.7 (*)    AST 619 (*)    ALT 369 (*)    Anion gap 18 (*)    All other components within normal limits  ETHANOL - Abnormal; Notable for the following components:   Alcohol, Ethyl (B) 252 (*)    All other components within normal limits  MAGNESIUM - Abnormal; Notable for the following components:   Magnesium 1.5 (*)    All other components within normal limits  BASIC METABOLIC PANEL - Abnormal; Notable for the following components:   Sodium 120 (*)    Potassium 2.2 (*)    Chloride 76 (*)    Glucose, Bld 113 (*)    BUN <5 (*)    Creatinine, Ser 0.52 (*)    Calcium 8.0  (*)    Anion gap 20 (*)    All other components within normal limits  SARS CORONAVIRUS 2 (HOSPITAL ORDER, [place] [facility])  PROTIME-INR  PHOSPHORUS  OSMOLALITY  OSMOLALITY, URINE  CREATININE, URINE, RANDOM  SODIUM, URINE, RANDOM  TSH  HEPATITIS PANEL, ACUTE  RAPID URINE DRUG SCREEN, HOSP PERFORMED  HIV ANTIBODY (ROUTINE TESTING W REFLEX)  CBC  BASIC METABOLIC PANEL  BASIC METABOLIC PANEL    EKG EKG Interpretation  Date/Time:  Monday Jan 10 2019 21:45:37 EDT Ventricular Rate:  69 PR Interval:    QRS Duration: 106 QT Interval:  436 QTC Calculation: 468 R Axis:   84 Text Interpretation:  Sinus rhythm since last tracing no significant change Confirmed by Daleen Bo [phone]) on 01/10/2019 9:52:32 PM   Radiology Ct Head Wo Contrast  Result Date: 01/10/2019 CLINICAL DATA:  Pain status post fall. Lacerations to temporal region. EXAM: CT HEAD WITHOUT CONTRAST CT CERVICAL SPINE WITHOUT CONTRAST TECHNIQUE: Multidetector CT imaging of the head and cervical spine was performed following the standard protocol without intravenous contrast. Multiplanar CT image reconstructions of the cervical spine were also generated. COMPARISON:  04/23/2015 FINDINGS: CT HEAD FINDINGS Brain: No evidence of acute infarction, hemorrhage, hydrocephalus, extra-axial collection or mass lesion/mass effect. Vascular: No hyperdense vessel or unexpected calcification. Skull: Normal. Negative for fracture or focal lesion. There is right frontal and temporal scalp swelling without evidence of an underlying fracture. There is some subcutaneous gas likely related to a laceration in this region. Sinuses/Orbits: No acute finding. Mucosal thickening of the right maxillary sinus is noted. Other: None. CT CERVICAL SPINE FINDINGS Alignment: There is straightening of the normal cervical lordotic curvature which is nonspecific. Skull base and vertebrae: No acute fracture. No primary bone lesion or focal  pathologic process. Soft tissues and spinal canal: No prevertebral fluid or swelling. No visible canal hematoma. Disc levels:  The disc heights are relatively well maintained. Upper chest: Negative. Other: None IMPRESSION: 1. No acute intracranial abnormality detected. 2. No acute cervical spine fracture. 3. Right temporal scalp swelling and lacerations without evidence of underlying calvarial defect. Electronically Signed   By: Constance Holster M.D.   On: 01/10/2019 21:31   Ct Cervical Spine Wo Contrast  Result Date: 01/10/2019 CLINICAL DATA:  Pain status post fall. Lacerations to temporal region. EXAM: CT HEAD WITHOUT CONTRAST CT CERVICAL SPINE WITHOUT CONTRAST TECHNIQUE: Multidetector CT imaging of the head and cervical spine was performed following the standard protocol without intravenous contrast. Multiplanar CT image reconstructions of the cervical spine were also generated. COMPARISON:  04/23/2015 FINDINGS: CT HEAD FINDINGS Brain: No evidence of acute infarction, hemorrhage, hydrocephalus, extra-axial collection or mass lesion/mass effect. Vascular: No hyperdense vessel or unexpected calcification. Skull: Normal. Negative for fracture or focal lesion. There is right frontal and temporal scalp swelling without evidence of an underlying fracture. There is some subcutaneous gas likely related to a laceration in this region. Sinuses/Orbits: No acute finding. Mucosal thickening of the right maxillary sinus is noted. Other: None. CT CERVICAL SPINE FINDINGS Alignment: There is straightening of the normal cervical lordotic curvature which is nonspecific. Skull base and vertebrae: No acute fracture. No primary bone lesion or focal pathologic process. Soft tissues and spinal canal: No prevertebral fluid or swelling. No visible canal hematoma. Disc levels:  The disc heights are relatively well maintained. Upper chest: Negative. Other: None IMPRESSION: 1. No acute intracranial abnormality detected. 2. No acute  cervical spine fracture. 3. Right temporal scalp swelling and lacerations without evidence of underlying calvarial defect. Electronically Signed   By: Constance Holster M.D.   On: 01/10/2019 21:31    Procedures .Marland KitchenLaceration Repair Date/Time: 01/10/2019 10:14 PM Performed by: Margarita Mail, PA-C Authorized by: Margarita Mail, PA-C  Consent:    Consent obtained:  Verbal   Consent given by:  Patient   Risks discussed:  Infection Anesthesia (see MAR for exact dosages):    Anesthesia method:  Local infiltration   Local anesthetic:  Lidocaine 2% WITH epi Laceration details:    Location:  Scalp   Scalp location:  R parietal   Length (cm):  3   Depth (mm):  3 Repair type:    Repair type:  Intermediate Pre-procedure details:    Preparation:  Patient was prepped and draped in usual sterile fashion Exploration:    Hemostasis achieved with:  Epinephrine   Wound extent comment:  Arterial   Contaminated: no   Treatment:    Wound cleansed with: Saline and chlorhexidine.   Amount of cleaning:  Standard   Irrigation solution:  Sterile saline Skin repair:    Repair method:  Staples   Number of staples:  4 Approximation:    Approximation:  Close Post-procedure details:    Dressing:  Open (no dressing)   Patient tolerance of procedure:  Tolerated well, no immediate complications  .Marland KitchenLaceration Repair Date/Time: 01/10/2019 10:25 PM Performed by: Margarita Mail, PA-C Authorized by: Margarita Mail, PA-C   Consent:    Consent obtained:  Verbal   Consent given by:  Patient   Risks discussed:  Infection, pain and poor cosmetic result   Alternatives discussed:  No treatment Anesthesia (see MAR for exact dosages):    Anesthesia method:  None Laceration details:    Location:  Scalp   Length (cm):  3 Repair type:    Repair type:  Simple Pre-procedure details:    Preparation:  Patient was prepped and draped in usual sterile fashion Exploration:    Hemostasis achieved with:   Epinephrine   Wound exploration: wound explored through full range of motion     Contaminated: no   Treatment:    Area cleansed with:  Betadine and saline   Amount of cleaning:  Standard   Irrigation solution:  Sterile saline   Irrigation method:  Pressure wash Skin repair:    Repair method:  Staples   Number of staples:  3 Approximation:    Approximation:  Close Post-procedure details:    Dressing:  Open (no dressing)   Patient tolerance of procedure:  Tolerated well, no immediate complications .Critical Care Performed by: Margarita Mail, PA-C Authorized by: Margarita Mail, PA-C   Critical care provider statement:    Critical care time (minutes):  40   Critical care was necessary to treat or prevent imminent or life-threatening deterioration of the following conditions:  Metabolic crisis   Critical care was time spent personally by me on the following activities:  Blood draw for specimens, discussions with consultants, evaluation of patient's response to treatment, development of treatment plan with patient or surrogate, examination of patient, interpretation of cardiac output measurements, ordering and performing treatments and interventions, ordering and review of radiographic studies, re-evaluation of patient's condition and review of old charts   (including critical care time)  Medications Ordered in ED Medications  LORazepam (ATIVAN) injection 0-4 mg (2 mg Intravenous Given 01/10/19 2150)    Or  LORazepam (ATIVAN) tablet 0-4 mg ( Oral See Alternative 01/10/19 2150)  LORazepam (ATIVAN) injection 0-4 mg (has no administration in time range)    Or  LORazepam (ATIVAN) tablet 0-4 mg (has no administration in time range)  thiamine (VITAMIN B-1) tablet 100 mg ( Oral See Alternative 01/10/19 2146)    Or  thiamine (B-1) injection 100  mg (100 mg Intravenous Given 01/10/19 2146)  magnesium sulfate IVPB 2 g 50 mL (2 g Intravenous New Bag/Given 01/10/19 2254)  potassium chloride 10 mEq  in 100 mL IVPB (10 mEq Intravenous New Bag/Given 01/10/19 2248)  0.9 %  sodium chloride infusion ( Intravenous New Bag/Given 01/10/19 2246)  allopurinol (ZYLOPRIM) tablet 200 mg (has no administration in time range)  sertraline (ZOLOFT) tablet 75 mg (has no administration in time range)  pantoprazole (PROTONIX) EC tablet 40 mg (has no administration in time range)  albuterol (VENTOLIN HFA) 108 (90 Base) MCG/ACT inhaler 2 puff (has no administration in time range)  nicotine (NICODERM CQ - dosed in mg/24 hours) patch 21 mg (has no administration in time range)  ondansetron (ZOFRAN) tablet 4 mg (has no administration in time range)    Or  ondansetron (ZOFRAN) injection 4 mg (has no administration in time range)  famotidine (PEPCID) IVPB 20 mg premix (has no administration in time range)  hydrALAZINE (APRESOLINE) injection 5 mg (has no administration in time range)  ibuprofen (ADVIL) tablet 400 mg (has no administration in time range)  lidocaine-EPINEPHrine (XYLOCAINE W/EPI) 2 %-1:100000 (with pres) injection 20 mL (20 mLs Intradermal Given by Other 01/10/19 2008)  hydrogen peroxide 3 % external solution (  Given by Other 01/10/19 2008)  Tdap (BOOSTRIX) injection 0.5 mL (0.5 mLs Intramuscular Given 01/10/19 2244)  potassium chloride SA (K-DUR) CR tablet 40 mEq (40 mEq Oral Given 01/10/19 2244)     Initial Impression / Assessment and Plan / ED Course  I have reviewed the triage vital signs and the nursing notes.  Pertinent labs & imaging results that were available during my care of the patient were reviewed by me and considered in my medical decision making (see chart for details).        ST:MHDQ/ etoh intoxication VS: BP 110/73    Pulse 73    Temp 97.7 F (36.5 C)    Resp 16    SpO2 91%  QI:WLNLGXQ is gathered by the patient and emr. DDX:The differential diagnosis for AMS is extensive and includes, but is not limited to: drug overdose - opioids, alcohol, sedatives, antipsychotics, drug  withdrawal, others; Metabolic: hypoxia, hypoglycemia, hyperglycemia, hypercalcemia, hypernatremia, hyponatremia, uremia, hepatic encephalopathy, hypothyroidism, hyperthyroidism, vitamin B12 or thiamine deficiency, carbon monoxide poisoning, Wilson's disease, Lactic acidosis, DKA/HHOS; Infectious: meningitis, encephalitis, bacteremia/sepsis, urinary tract infection, pneumonia, neurosyphilis; Structural: Space-occupying lesion, (brain tumor, subdural hematoma, hydrocephalus,); Vascular: stroke, subarachnoid hemorrhage, coronary ischemia, hypertensive encephalopathy, CNS vasculitis, thrombotic thrombocytopenic purpura, disseminated intravascular coagulation, hyperviscosity; Psychiatric: Schizophrenia, depression; Other: Seizure, hypothermia, heat stroke, ICU psychosis, dementia -"sundowning." Labs: I reviewed the labs which show marked hyponatremia, hypokalemia, significant transaminitis anion gap metabolic acidosis likely from ethanol elevated blood alcohol level, Imaging: I personally reviewed the images (CT head and C-spine) which show(s) no intracranial or acute cervical pathology EKG: Normal sinus rhythm without QT prolongation or widened QRS  EKG Interpretation  Date/Time:  Monday Jan 10 2019 21:45:37 EDT Ventricular Rate:  69 PR Interval:    QRS Duration: 106 QT Interval:  436 QTC Calculation: 468 R Axis:   84 Text Interpretation:  Sinus rhythm since last tracing no significant change Confirmed by Daleen Bo [phone]) on 01/10/2019 9:52:32 PM      MDM: Patient here with acute hyponatremia.  Differential diagnosis includes beer drinkers put a mania, poor nutrition, methanol intoxication less likely Patient disposition: Admit Patient condition: Stable. The patient appears reasonably stabilized for admission considering the current resources, flow, and capabilities available in  the ED at this time, and I doubt any other [facility] requiring further screening and/or treatment in the ED prior to  admission.   Final Clinical Impressions(s) / ED Diagnoses   Final diagnoses:  Hyponatremia  Hypokalemia  Alcoholic intoxication with complication ([place])  Transaminitis  Fall, initial encounter  Laceration of scalp, initial encounter    ED Discharge Orders    None       Margarita Mail, PA-C 01/10/19 2348    Daleen Bo, MD 01/13/19 [phone]

## 2019-01-10 NOTE — ED Notes (Signed)
C-Collar removed, cleared by provider.

## 2019-01-10 NOTE — H&P (Signed)
History and Physical    [name] HGD:[id number] DOB: 02/28/1971 DOA: 01/10/2019  Referring MD/NP/PA:   PCP: Scot Jun, FNP   Patient coming from:  The patient is coming from street.  At baseline, pt is independent for most of ADL.        Chief Complaint: fall  HPI: [name] is a 48 y.o. male with medical history significant of schizoaffective disorder, PTSD, chronic alcohol abuse, hypertension, GERD, gout, depression with anxiety, tobacco abuse,TBI, who presents with fall.  Pt states that he drinks heavily every day and had about 20 beers today. Per EMS report, pt was brought to ED from middle of road. Pt tripped and fell twice and hit his head. He has scalp laceration to the right temporal area.  Patient denies unilateral weakness.  Patient does not have chest pain, cough or shortness of breath no fever or chills.  He states that he has nausea, no vomiting, diarrhea or abdominal pain.  No symptoms of UTI.  Patient states that he is not taking his medications recently. No suicidal homicidal ideations. He states that he did not eat well recently.  ED Course: pt was found to have WBC 4.0, alcohol level 252, sodium 118, potassium 2.5, renal function normal, abnormal liver function (ALP 99, AST 619, ALT 369, total bilirubin 1.1), temperature normal, no tachycardia, no tachypnea, oxygen saturation 92 to 95% on room air.  CT of head is negative for acute intracranial abnormalities.  CT of C-spine is negative for fracture.  CT of head showed right temporal scalp swelling and lacerations without evidence of underlying calvarial defect. Pt is admitted to tele bed as inpt  Review of Systems:   General: no fevers, chills, no body weight gain, has poor appetite, has fatigue HEENT: no blurry vision, hearing changes or sore throat Respiratory: no dyspnea, coughing, wheezing CV: no chest pain, no palpitations GI: has nausea, no vomiting, abdominal pain, diarrhea, constipation GU: no  dysuria, burning on urination, increased urinary frequency, hematuria  Ext: no leg edema Neuro: no unilateral weakness, numbness, or tingling, no vision change or hearing loss. Had fall. Skin: no rash. Has scalp laceration. MSK: No muscle spasm, no deformity, no limitation of range of movement in spin Heme: No easy bruising.  Travel history: No recent long distant travel.  Allergy:  Allergies  Allergen Reactions  . Wellbutrin [Bupropion] Other (See Comments)    Reaction:  Seizure     Past Medical History:  Diagnosis Date  . Alcohol abuse   . Anxiety    at age 18  . Depression    at age 74  . Psychosis ([place])   . PTSD (post-traumatic stress disorder)   . Schizoaffective disorder   . Seizure disorder ([place])    related to etoh seizure    Past Surgical History:  Procedure Laterality Date  . FOOT SURGERY     right  . HAND SURGERY      Social History:  reports that he has been smoking cigarettes. He has a 24.00 pack-year smoking history. He has never used smokeless tobacco. He reports current alcohol use. He reports current drug use. Frequency: 1.00 time per week. Drug: Marijuana.  Family History:  Family History  Problem Relation Age of Onset  . Alcoholism Mother   . CAD Father   . Depression Brother      Prior to Admission medications   Medication Sig Start Date End Date Taking? Authorizing Provider  albuterol (PROVENTIL HFA;VENTOLIN HFA) 108 (90 Base) MCG/ACT  inhaler Inhale 2 puffs into the lungs every 6 (six) hours as needed for wheezing or shortness of breath. 06/30/18   Scot Jun, FNP  allopurinol (ZYLOPRIM) 100 MG tablet Take 2 tablets (200 mg total) by mouth daily. 07/02/18 10/05/18  Scot Jun, FNP  metoprolol tartrate (LOPRESSOR) 25 MG tablet Take 0.5 tablets (12.5 mg total) by mouth 2 (two) times daily. 06/30/18   Scot Jun, FNP  paliperidone (INVEGA SUSTENNA) 156 MG/ML SUSY injection Inject 1 mL (156 mg total) into the muscle every 30  (thirty) days. Last administration 06/07/18. Next administration due 07/07/18. 07/07/18   [name], MD  pantoprazole (PROTONIX) 40 MG tablet Take 1 tablet (40 mg total) by mouth daily. 06/30/18   Scot Jun, FNP  sertraline (ZOLOFT) 50 MG tablet Take 75 mg by mouth daily.  12/14/18   [provider]    Physical Exam: Vitals:   01/10/19 2200 01/10/19 2230 01/10/19 2244 01/10/19 2247  BP: 102/72 111/67 111/67   Pulse: 69 60 73 71  Resp: 18 18  18   Temp:      TempSrc:      SpO2: 94% 91%  97%   General: Not in acute distress HEENT:       Eyes: PERRL, EOMI, no scleral icterus.       ENT: No discharge from the ears and nose, no pharynx injection, no tonsillar enlargement.        Neck: No JVD, no bruit, no mass felt. Heme: No neck lymph node enlargement. Cardiac: S1/S2, RRR, No murmurs, No gallops or rubs. Respiratory: No rales, wheezing, rhonchi or rubs. GI: Soft, nondistended, nontender, no rebound pain, no organomegaly, BS present. GU: No hematuria Ext: No pitting leg edema bilaterally. 2+DP/PT pulse bilaterally. Musculoskeletal: No joint deformities, No joint redness or warmth, no limitation of ROM in spin. Skin: No rashes. Has right temporal area scalp laceration which is sutured up and bleeding is controlled.  Neuro: Alert, oriented X3, cranial nerves II-XII grossly intact, moves all extremities normally.  Psych: Patient is not psychotic, no suicidal or hemocidal ideation.  Labs on Admission: I have personally reviewed following labs and imaging studies  CBC: Recent Labs  Lab 01/10/19 2023  WBC 4.0  NEUTROABS 2.4  HGB 13.9  HCT 38.0*  MCV 91.1  PLT 662*   Basic Metabolic Panel: Recent Labs  Lab 01/10/19 2023  NA 118*  K 2.5*  CL 71*  CO2 29  GLUCOSE 141*  BUN <5*  CREATININE 0.71  CALCIUM 8.7*   GFR: CrCl cannot be calculated (Unknown ideal weight.). Liver Function Tests: Recent Labs  Lab 01/10/19 2023  AST 619*  ALT 369*   ALKPHOS 99  BILITOT 1.1  PROT 6.7  ALBUMIN 3.9   No results for input(s): LIPASE, AMYLASE in the last 168 hours. No results for input(s): AMMONIA in the last 168 hours. Coagulation Profile: No results for input(s): INR, PROTIME in the last 168 hours. Cardiac Enzymes: No results for input(s): CKTOTAL, CKMB, CKMBINDEX, TROPONINI in the last 168 hours. BNP (last 3 results) No results for input(s): PROBNP in the last 8760 hours. HbA1C: No results for input(s): HGBA1C in the last 72 hours. CBG: No results for input(s): GLUCAP in the last 168 hours. Lipid Profile: No results for input(s): CHOL, HDL, LDLCALC, TRIG, CHOLHDL, LDLDIRECT in the last 72 hours. Thyroid Function Tests: No results for input(s): TSH, T4TOTAL, FREET4, T3FREE, THYROIDAB in the last 72 hours. Anemia Panel: No results for input(s): VITAMINB12,  FOLATE, FERRITIN, TIBC, IRON, RETICCTPCT in the last 72 hours. Urine analysis:    Component Value Date/Time   COLORURINE AMBER (A) 04/06/2016 1026   APPEARANCEUR CLEAR 04/06/2016 1026   LABSPEC 1.025 04/06/2016 1026   PHURINE 6.0 04/06/2016 1026   GLUCOSEU NEGATIVE 04/06/2016 1026   HGBUR NEGATIVE 04/06/2016 1026   BILIRUBINUR SMALL (A) 04/06/2016 1026   KETONESUR 40 (A) 04/06/2016 1026   PROTEINUR 100 (A) 04/06/2016 1026   UROBILINOGEN 1.0 12/24/2014 0643   NITRITE NEGATIVE 04/06/2016 1026   LEUKOCYTESUR SMALL (A) 04/06/2016 1026   Sepsis Labs: @LABRCNTIP (procalcitonin:4,lacticidven:4) )No results found for this or any previous visit (from the past 240 hour(s)).   Radiological Exams on Admission: Ct Head Wo Contrast  Result Date: 01/10/2019 CLINICAL DATA:  Pain status post fall. Lacerations to temporal region. EXAM: CT HEAD WITHOUT CONTRAST CT CERVICAL SPINE WITHOUT CONTRAST TECHNIQUE: Multidetector CT imaging of the head and cervical spine was performed following the standard protocol without intravenous contrast. Multiplanar CT image reconstructions of the  cervical spine were also generated. COMPARISON:  04/23/2015 FINDINGS: CT HEAD FINDINGS Brain: No evidence of acute infarction, hemorrhage, hydrocephalus, extra-axial collection or mass lesion/mass effect. Vascular: No hyperdense vessel or unexpected calcification. Skull: Normal. Negative for fracture or focal lesion. There is right frontal and temporal scalp swelling without evidence of an underlying fracture. There is some subcutaneous gas likely related to a laceration in this region. Sinuses/Orbits: No acute finding. Mucosal thickening of the right maxillary sinus is noted. Other: None. CT CERVICAL SPINE FINDINGS Alignment: There is straightening of the normal cervical lordotic curvature which is nonspecific. Skull base and vertebrae: No acute fracture. No primary bone lesion or focal pathologic process. Soft tissues and spinal canal: No prevertebral fluid or swelling. No visible canal hematoma. Disc levels:  The disc heights are relatively well maintained. Upper chest: Negative. Other: None IMPRESSION: 1. No acute intracranial abnormality detected. 2. No acute cervical spine fracture. 3. Right temporal scalp swelling and lacerations without evidence of underlying calvarial defect. Electronically Signed   By: Constance Holster M.D.   On: 01/10/2019 21:31   Ct Cervical Spine Wo Contrast  Result Date: 01/10/2019 CLINICAL DATA:  Pain status post fall. Lacerations to temporal region. EXAM: CT HEAD WITHOUT CONTRAST CT CERVICAL SPINE WITHOUT CONTRAST TECHNIQUE: Multidetector CT imaging of the head and cervical spine was performed following the standard protocol without intravenous contrast. Multiplanar CT image reconstructions of the cervical spine were also generated. COMPARISON:  04/23/2015 FINDINGS: CT HEAD FINDINGS Brain: No evidence of acute infarction, hemorrhage, hydrocephalus, extra-axial collection or mass lesion/mass effect. Vascular: No hyperdense vessel or unexpected calcification. Skull: Normal.  Negative for fracture or focal lesion. There is right frontal and temporal scalp swelling without evidence of an underlying fracture. There is some subcutaneous gas likely related to a laceration in this region. Sinuses/Orbits: No acute finding. Mucosal thickening of the right maxillary sinus is noted. Other: None. CT CERVICAL SPINE FINDINGS Alignment: There is straightening of the normal cervical lordotic curvature which is nonspecific. Skull base and vertebrae: No acute fracture. No primary bone lesion or focal pathologic process. Soft tissues and spinal canal: No prevertebral fluid or swelling. No visible canal hematoma. Disc levels:  The disc heights are relatively well maintained. Upper chest: Negative. Other: None IMPRESSION: 1. No acute intracranial abnormality detected. 2. No acute cervical spine fracture. 3. Right temporal scalp swelling and lacerations without evidence of underlying calvarial defect. Electronically Signed   By: Constance Holster M.D.   On: 01/10/2019  21:31     EKG: Independently reviewed.  Sinus rhythm, QTC 468, low voltage in lead I, nonspecific T wave change.  Assessment/Plan Principal Problem:   Fall Active Problems:   Hypokalemia   Hyponatremia   Abnormal LFTs   Alcohol abuse with intoxication ([place])   Tobacco dependence due to cigarettes   Schizoaffective disorder, depressive type ([place])   Gout   GERD (gastroesophageal reflux disease)   HTN (hypertension)   Fall: Most likely due to alcohol intoxication.  CT of head is negative for acute intracranial abnormalities.  CT of C-spine is negative for fracture.  CT of head showed right temporal scalp swelling and lacerations without evidence of underlying calvarial defect -will admit to tele bed as inpt -wound care consult for scalp laceration. -prn Ibuprofen for pain  Hx of alcohol abuse, Alcohol abuse with intoxication and tobacco dependence due to cigarettes: -CiWA protocol -nicotine patch -did counseling about  importance of quitting alcohol drinking and smoking  Hypokalemia: K= 2.5 on admission. - Repleted - Check Mg level - Give 2 g of magnesium sulfate -check phosphorus level  Hyponatremia: Likely multifactorial etiology, including poor oral intake and dehydration, potomania, side effects of her psych medications - Will check urine sodium, urine osmolality, serum osmolality. - check TSH - Fluid restriction to 1200 cc - IVF: 100 cc/h of NS - f/u by BMP q6h  Abnormal LFTs: Most likely due to alcohol abuse -Check hepatitis panel and HIV antibody - Avoid using liver toxic medications such as atenolol  Gout: -Allopurinol  GERD (gastroesophageal reflux disease): -Protonix -Give 1 dose of 20 mg of IV Pepcid tonight  Hx of Schizoaffective disorder, depressive type ([place]): -Zoloft  HTN (hypertension): pt is not taking metoprolol at home.  Blood pressure normal 107/75 -IV hydralazine as needed    Inpatient status:  # Patient requires inpatient status due to high intensity of service, high risk for further deterioration and high frequency of surveillance required.  I certify that at the point of admission it is my clinical judgment that the patient will require inpatient hospital care spanning beyond 2 midnights from the point of admission.  . This patient has multiple chronic comorbidities including schizoaffective disorder, PTSD, chronic alcohol abuse, hypertension, GERD, gout, depression with anxiety, tobacco abuse,TBI . Now patient has presenting with fall, alcohol intoxication, hyponatremia with sodium 118, profound hypokalemia with potassium 2.5-->2.2, abnormal liver function . The worrisome physical exam findings include scalp laceration . The initial radiographic and laboratory data are worrisome because of alcohol level 252,  hyponatremia with sodium 118, profound hypokalemia with potassium 2.5-->2.2, abnormal liver function . Current medical needs: please see my assessment and  plan . Predictability of an adverse outcome (risk): Patient has has multiple comorbidities, now presents with multiple acute issues, particularly profound electrolytes disturbance.  Sodium 118, which will need to be repleted slowly, at the least 48 hours will be needed, therefore patient needs to be treated in hospital for at least 2 days.     DVT ppx: SCD Code Status: Full code Family Communication: None at bed side.     Disposition Plan:  Anticipate discharge back to previous home environment Consults called:  none Admission status:  Inpatient/tele      Date of Service 01/10/2019    [facility]   If 7PM-7AM, please contact night-coverage [email or web address] Password [facility] 01/10/2019, 10:57 PM

## 2019-01-10 NOTE — ED Provider Notes (Signed)
  Face-to-face evaluation   History: Patient fell and struck his head, he admits to drinking alcohol.  Physical exam: Alert, cooperative, mild arthritic.  Moves all extremities equally.  Large lacerations, right scalp, x2, have been stapled with times outpatient.  There is mild associated swelling, but no bleeding.  Vital signs are normal.  Medical screening examination/treatment/procedure(s) were conducted as a shared visit with non-physician practitioner(s) and myself.  I personally evaluated the patient during the encounter    Daleen Bo, MD 01/13/19 [phone]

## 2019-01-10 NOTE — ED Notes (Signed)
Bed: WA21 Expected date:  Expected time:  Means of arrival:  Comments: EMS 48 yo ETOH

## 2019-01-10 NOTE — ED Notes (Signed)
Assisted provider with head lacerations.  Laceration 1: 4 staples Laceration 2: 3 staples

## 2019-01-11 LAB — GLUCOSE, CAPILLARY: Glucose-Capillary: 97 mg/dL (ref 70–99)

## 2019-01-11 LAB — BASIC METABOLIC PANEL WITH GFR
Anion gap: 14 (ref 5–15)
BUN: 5 mg/dL — ABNORMAL LOW (ref 6–20)
CO2: 28 mmol/L (ref 22–32)
Calcium: 8.8 mg/dL — ABNORMAL LOW (ref 8.9–10.3)
Chloride: 82 mmol/L — ABNORMAL LOW (ref 98–111)
Creatinine, Ser: 0.71 mg/dL (ref 0.61–1.24)
GFR calc Af Amer: >60 mL/min
GFR calc non Af Amer: >60 mL/min
Glucose, Bld: 100 mg/dL — ABNORMAL HIGH (ref 70–99)
Potassium: 3.4 mmol/L — ABNORMAL LOW (ref 3.5–5.1)
Sodium: 124 mmol/L — ABNORMAL LOW (ref 135–145)

## 2019-01-11 LAB — CREATININE, URINE, RANDOM: Creatinine, Urine: 72.85 mg/dL

## 2019-01-11 LAB — CBC
HCT: 36.8 % — ABNORMAL LOW (ref 39.0–52.0)
Hemoglobin: 13.3 g/dL (ref 13.0–17.0)
MCH: 33.4 pg (ref 26.0–34.0)
MCHC: 36.1 g/dL — ABNORMAL HIGH (ref 30.0–36.0)
MCV: 92.5 fL (ref 80.0–100.0)
Platelets: 139 10*3/uL — ABNORMAL LOW (ref 150–400)
RBC: 3.98 MIL/uL — ABNORMAL LOW (ref 4.22–5.81)
RDW: 12.7 % (ref 11.5–15.5)
WBC: 3.8 10*3/uL — ABNORMAL LOW (ref 4.0–10.5)
nRBC: 0 % (ref 0.0–0.2)

## 2019-01-11 LAB — OSMOLALITY, URINE: Osmolality, Ur: 182 mOsm/kg — ABNORMAL LOW (ref 300–900)

## 2019-01-11 LAB — TSH: TSH: 0.811 u[IU]/mL (ref 0.350–4.500)

## 2019-01-11 LAB — RAPID URINE DRUG SCREEN, HOSP PERFORMED
Amphetamines: NOT DETECTED
Barbiturates: NOT DETECTED
Benzodiazepines: NOT DETECTED
Cocaine: NOT DETECTED
Opiates: NOT DETECTED
Tetrahydrocannabinol: POSITIVE — AB

## 2019-01-11 LAB — OSMOLALITY: Osmolality: 303 mOsm/kg — ABNORMAL HIGH (ref 275–295)

## 2019-01-11 LAB — SODIUM, URINE, RANDOM: Sodium, Ur: <10 mmol/L

## 2019-01-11 MED ORDER — PNEUMOCOCCAL VAC POLYVALENT 25 MCG/0.5ML IJ INJ
0.5000 mL | INJECTION | INTRAMUSCULAR | Status: DC
  Filled 2019-01-11: qty 0.5

## 2019-01-11 MED ORDER — ADULT MULTIVITAMIN W/MINERALS CH
1.0000 | ORAL_TABLET | Freq: Every day | ORAL | Status: DC
  Administered 2019-01-12: 1 via ORAL
  Filled 2019-01-11 (×2): qty 1

## 2019-01-11 MED ORDER — MUPIROCIN 2 % EX OINT
TOPICAL_OINTMENT | Freq: Two times a day (BID) | CUTANEOUS | Status: DC
  Administered 2019-01-11: 22:00:00 via NASAL
  Administered 2019-01-11: 1 via NASAL
  Administered 2019-01-12 – 2019-01-15 (×7): via NASAL
  Filled 2019-01-11 (×2): qty 22

## 2019-01-11 MED ORDER — POTASSIUM CHLORIDE CRYS ER 20 MEQ PO TBCR
40.0000 meq | EXTENDED_RELEASE_TABLET | Freq: Once | ORAL | Status: AC
  Administered 2019-01-11: 11:00:00 40 meq via ORAL
  Filled 2019-01-11: qty 2

## 2019-01-11 MED ORDER — ENSURE ENLIVE PO LIQD
237.0000 mL | Freq: Two times a day (BID) | ORAL | Status: DC
  Administered 2019-01-12 – 2019-01-14 (×5): 237 mL via ORAL

## 2019-01-11 MED ORDER — METOPROLOL TARTRATE 12.5 MG HALF TABLET
12.5000 mg | ORAL_TABLET | Freq: Two times a day (BID) | ORAL | Status: DC
  Administered 2019-01-11 – 2019-01-15 (×9): 12.5 mg via ORAL
  Filled 2019-01-11 (×9): qty 1

## 2019-01-11 MED ORDER — FOLIC ACID 1 MG PO TABS
1.0000 mg | ORAL_TABLET | Freq: Every day | ORAL | Status: DC
  Administered 2019-01-11 – 2019-01-15 (×5): 1 mg via ORAL
  Filled 2019-01-11 (×5): qty 1

## 2019-01-11 NOTE — Progress Notes (Addendum)
PROGRESS NOTE    [name]  ZOX:[id number] DOB: [birth year] DOA: 01/10/2019 PCP: Scot Jun, FNP   Brief Narrative: Patient is a 48 year old male with history of seizures affective disorder, PTSD, chronic alcohol abuse, hypertension, GERD, smoker, gout, depression with anxiety who was brought to the emergency department after he was found to have fallen on the street.  On presentation, patient was found to have severe hyponatremia, Severe hypokalemia.  He had laceration of scalp on the right side.  He was acutely intoxicated with alcohol with elevated blood alcohol level.  Currently being managed for hyponatremia, potential severe alcohol withdrawal.  Assessment & Plan:   Principal Problem:   Fall Active Problems:   Hypokalemia   Hyponatremia   Abnormal LFTs   Alcohol abuse with intoxication ([place])   Tobacco dependence due to cigarettes   Schizoaffective disorder, depressive type ([place])   Gout   GERD (gastroesophageal reflux disease)   HTN (hypertension)   Hyponatremia: Mainly from beer potomania and dehydration.  He says he drinks 20 bottles of 24 ounce beer every day.  Urine sodium less than 10 on presentation. Sodium level improving with IV fluids.  We will continue IV fluids for now.  Chronic alcohol abuse/alcohol withdrawal: Highly potential for severe withdrawal.  Drinks about 20 bottles of 24 ounce beer every day.  Will monitor CIWA.  If deteriorates or remains agitated despite several doses of Ativan, he will benefit from being moved to ICU for Precedex drip.  CIWA score this morning was 10. Started on thiamine folic acid.  Counseled for alcohol cessation.  Will request for social worker evaluation for rehabilitation services resources when he becomes more stable.  Hypokalemia: Being supplemented with potassium.  Fall: Found on the street.  Acutely alcohol intoxicated.  Lacerated his scalp on the right side.  Has numerous skin breakdowns, abrasions.  Continue  supportive care.  Physical therapy evaluation.  Elevated LFTs/thrombocytopenia: AST> ALT.Alcoholic pattern.Associated with chronic alcohol abuse.  Continue to monitor.  Gout: On allopurinol  Tobacco abuse: Smokes a pack a day since last several years.  Counseled for cessation.  Continue nicotine patch  GERD: Continue Protonix  Schizoaffective disorder/anxiety/depression: Continue his home medicine sertraline.Follows with psychiatry. On monthly paliperidone.  Hypertension:  Supposed to be on metoprolol at home but not taking. BP stable this morning. Will resume metoprolol.    Nutrition Problem: Inadequate oral intake Etiology: poor appetite(ETOH abuse)      DVT prophylaxis:SCD Code Status: Full Family Communication: None present at the bedside Disposition Plan: Undetermined at this point   Consultants: None  Procedures:None  Antimicrobials:  Anti-infectives (From admission, onward)   None      Subjective:  Patient seen and examined the bedside this morning.  Currently hemodynamically stable.  Denies any complaints like shortness of breath, chest pain or abdominal pain.  Currently alert and oriented.  Objective: Vitals:   01/10/19 2300 01/10/19 2301 01/10/19 2340 01/11/19 0542  BP: (!) 125/98  110/73 129/77  Pulse: 93 90 73 96  Resp: (!) 22 18 16 16   Temp:   97.7 F (36.5 C) 98.6 F (37 C)  TempSrc:      SpO2: 97% 94% 91% 97%    Intake/Output Summary (Last 24 hours) at 01/11/2019 1355 Last data filed at 01/11/2019 0900 Gross per 24 hour  Intake 240 ml  Output 1400 ml  Net -1160 ml   There were no vitals filed for this visit.  Examination:  General exam: Disheavled HEENT:PERRL,Oral mucosa moist, Ear/Nose normal on  gross exam Scalp laceration on the right side which was sutured, dried blood around the hair, neck Respiratory system: Bilateral equal air entry, normal vesicular breath sounds, no wheezes or crackles  Cardiovascular system: S1 & S2 heard,  RRR. No JVD, murmurs, rubs, gallops or clicks. No pedal edema. Gastrointestinal system: Abdomen is distended, soft and nontender. No organomegaly or masses felt. Normal bowel sounds heard. Central nervous system: Alert and oriented. No focal neurological deficits. Extremities: .no edema, no clubbing ,no cyanosis, distal peripheral pulses palpable. Skin: Numerous abrasions/skin breakdowns    Data Reviewed: I have personally reviewed following labs and imaging studies  CBC: Recent Labs  Lab 01/10/19 2023 01/11/19 0539  WBC 4.0 3.8*  NEUTROABS 2.4  --   HGB 13.9 13.3  HCT 38.0* 36.8*  MCV 91.1 92.5  PLT 149* 657*   Basic Metabolic Panel: Recent Labs  Lab 01/10/19 2023 01/10/19 2236 01/10/19 2300 01/11/19 0539  NA 118* 120*  --  124*  K 2.5* 2.2*  --  3.4*  CL 71* 76*  --  82*  CO2 29 24  --  28  GLUCOSE 141* 113*  --  100*  BUN <5* <5*  --  5*  CREATININE 0.71 0.52*  --  0.71  CALCIUM 8.7* 8.0*  --  8.8*  MG  --  1.5*  --   --   PHOS  --   --  3.1  --    GFR: CrCl cannot be calculated (Unknown ideal weight.). Liver Function Tests: Recent Labs  Lab 01/10/19 2023  AST 619*  ALT 369*  ALKPHOS 99  BILITOT 1.1  PROT 6.7  ALBUMIN 3.9   No results for input(s): LIPASE, AMYLASE in the last 168 hours. No results for input(s): AMMONIA in the last 168 hours. Coagulation Profile: Recent Labs  Lab 01/10/19 2236  INR 0.9   Cardiac Enzymes: No results for input(s): CKTOTAL, CKMB, CKMBINDEX, TROPONINI in the last 168 hours. BNP (last 3 results) No results for input(s): PROBNP in the last 8760 hours. HbA1C: No results for input(s): HGBA1C in the last 72 hours. CBG: Recent Labs  Lab 01/11/19 0720  GLUCAP 97   Lipid Profile: No results for input(s): CHOL, HDL, LDLCALC, TRIG, CHOLHDL, LDLDIRECT in the last 72 hours. Thyroid Function Tests: Recent Labs    01/11/19 0539  TSH 0.811   Anemia Panel: No results for input(s): VITAMINB12, FOLATE, FERRITIN, TIBC,  IRON, RETICCTPCT in the last 72 hours. Sepsis Labs: No results for input(s): PROCALCITON, LATICACIDVEN in the last 168 hours.  Recent Results (from the past 240 hour(s))  SARS Coronavirus 2 [facility] order, Performed in [place] hospital lab)     Status: None   Collection Time: 01/10/19 10:43 PM  Result Value Ref Range Status   SARS Coronavirus 2 NEGATIVE NEGATIVE Final    Comment: (NOTE) If result is NEGATIVE SARS-CoV-2 target nucleic acids are NOT DETECTED. The SARS-CoV-2 RNA is generally detectable in upper and lower  respiratory specimens during the acute phase of infection. The lowest  concentration of SARS-CoV-2 viral copies this assay can detect is 250  copies / mL. A negative result does not preclude SARS-CoV-2 infection  and should not be used as the sole basis for treatment or other  patient management decisions.  A negative result may occur with  improper specimen collection / handling, submission of specimen other  than nasopharyngeal swab, presence of viral mutation(s) within the  areas targeted by this assay, and inadequate number of viral copies  (<  250 copies / mL). A negative result must be combined with clinical  observations, patient history, and epidemiological information. If result is POSITIVE SARS-CoV-2 target nucleic acids are DETECTED. The SARS-CoV-2 RNA is generally detectable in upper and lower  respiratory specimens dur ing the acute phase of infection.  Positive  results are indicative of active infection with SARS-CoV-2.  Clinical  correlation with patient history and other diagnostic information is  necessary to determine patient infection status.  Positive results do  not rule out bacterial infection or co-infection with other viruses. If result is PRESUMPTIVE POSTIVE SARS-CoV-2 nucleic acids MAY BE PRESENT.   A presumptive positive result was obtained on the submitted specimen  and confirmed on repeat testing.  While 2019 novel coronavirus    (SARS-CoV-2) nucleic acids may be present in the submitted sample  additional confirmatory testing may be necessary for epidemiological  and / or clinical management purposes  to differentiate between  SARS-CoV-2 and other Sarbecovirus currently known to infect humans.  If clinically indicated additional testing with an alternate test  methodology [phone]) is advised. The SARS-CoV-2 RNA is generally  detectable in upper and lower respiratory sp ecimens during the acute  phase of infection. The expected result is Negative. Fact Sheet for Patients:  StrictlyIdeas.no Fact Sheet for Healthcare Providers: BankingDealers.co.za This test is not yet approved or cleared by the [place] FDA and has been authorized for detection and/or diagnosis of SARS-CoV-2 by FDA under an Emergency Use Authorization (EUA).  This EUA will remain in effect (meaning this test can be used) for the duration of the COVID-19 declaration under Section 564(b)(1) of the Act, 21 U.S.C. section 360bbb-3(b)(1), unless the authorization is terminated or revoked sooner. Performed at [facility], [place] [address] [place],  [address]          Radiology Studies: Ct Head Wo Contrast  Result Date: 01/10/2019 CLINICAL DATA:  Pain status post fall. Lacerations to temporal region. EXAM: CT HEAD WITHOUT CONTRAST CT CERVICAL SPINE WITHOUT CONTRAST TECHNIQUE: Multidetector CT imaging of the head and cervical spine was performed following the standard protocol without intravenous contrast. Multiplanar CT image reconstructions of the cervical spine were also generated. COMPARISON:  04/23/2015 FINDINGS: CT HEAD FINDINGS Brain: No evidence of acute infarction, hemorrhage, hydrocephalus, extra-axial collection or mass lesion/mass effect. Vascular: No hyperdense vessel or unexpected calcification. Skull: Normal. Negative for fracture or focal lesion. There is  right frontal and temporal scalp swelling without evidence of an underlying fracture. There is some subcutaneous gas likely related to a laceration in this region. Sinuses/Orbits: No acute finding. Mucosal thickening of the right maxillary sinus is noted. Other: None. CT CERVICAL SPINE FINDINGS Alignment: There is straightening of the normal cervical lordotic curvature which is nonspecific. Skull base and vertebrae: No acute fracture. No primary bone lesion or focal pathologic process. Soft tissues and spinal canal: No prevertebral fluid or swelling. No visible canal hematoma. Disc levels:  The disc heights are relatively well maintained. Upper chest: Negative. Other: None IMPRESSION: 1. No acute intracranial abnormality detected. 2. No acute cervical spine fracture. 3. Right temporal scalp swelling and lacerations without evidence of underlying calvarial defect. Electronically Signed   By: Constance Holster M.D.   On: 01/10/2019 21:31   Ct Cervical Spine Wo Contrast  Result Date: 01/10/2019 CLINICAL DATA:  Pain status post fall. Lacerations to temporal region. EXAM: CT HEAD WITHOUT CONTRAST CT CERVICAL SPINE WITHOUT CONTRAST TECHNIQUE: Multidetector CT imaging of the head and cervical spine was performed  following the standard protocol without intravenous contrast. Multiplanar CT image reconstructions of the cervical spine were also generated. COMPARISON:  04/23/2015 FINDINGS: CT HEAD FINDINGS Brain: No evidence of acute infarction, hemorrhage, hydrocephalus, extra-axial collection or mass lesion/mass effect. Vascular: No hyperdense vessel or unexpected calcification. Skull: Normal. Negative for fracture or focal lesion. There is right frontal and temporal scalp swelling without evidence of an underlying fracture. There is some subcutaneous gas likely related to a laceration in this region. Sinuses/Orbits: No acute finding. Mucosal thickening of the right maxillary sinus is noted. Other: None. CT CERVICAL  SPINE FINDINGS Alignment: There is straightening of the normal cervical lordotic curvature which is nonspecific. Skull base and vertebrae: No acute fracture. No primary bone lesion or focal pathologic process. Soft tissues and spinal canal: No prevertebral fluid or swelling. No visible canal hematoma. Disc levels:  The disc heights are relatively well maintained. Upper chest: Negative. Other: None IMPRESSION: 1. No acute intracranial abnormality detected. 2. No acute cervical spine fracture. 3. Right temporal scalp swelling and lacerations without evidence of underlying calvarial defect. Electronically Signed   By: Constance Holster M.D.   On: 01/10/2019 21:31        Scheduled Meds:  allopurinol  200 mg Oral Daily   feeding supplement (ENSURE ENLIVE)  237 mL Oral BID BM   folic acid  1 mg Oral Daily   LORazepam  0-4 mg Intravenous Q6H   Or   LORazepam  0-4 mg Oral Q6H   [START ON 01/13/2019] LORazepam  0-4 mg Intravenous Q12H   Or   [START ON 01/13/2019] LORazepam  0-4 mg Oral Q12H   multivitamin with minerals  1 tablet Oral Daily   mupirocin ointment   Nasal BID   nicotine  21 mg Transdermal Daily   pantoprazole  40 mg Oral Daily   [START ON 01/12/2019] pneumococcal 23 valent vaccine  0.5 mL Intramuscular Tomorrow-1000   sertraline  75 mg Oral Daily   thiamine  100 mg Oral Daily   Or   thiamine  100 mg Intravenous Daily   Continuous Infusions:  sodium chloride 75 mL/hr at 01/11/19 1003     LOS: 1 day    Time spent:25 mins. More than 50% of that time was spent in counseling and/or coordination of care.      Shelly Coss, MD [facility] Pager [phone]  If 7PM-7AM, please contact night-coverage [email or web address] Password [email or web address] 01/11/2019, 1:55 PM

## 2019-01-11 NOTE — Consult Note (Signed)
[place] Nurse wound consult note Patient receiving care in [place]. Reason for Consult:  Scalp laceration from falls prior to arrival to the hospital.  This [place] consult was completed remotely after review of the record and speaking with the primary RN, Elvis Coil over the telephone Wound type: trauma Wound bed: Patient has a laceration to the right temporal area with sutures intact.  The area is heavily covered by hair preventing an application of any type of bandage. Drainage (amount, consistency, odor) dried blood Periwound: intact Dressing procedure/placement/frequency: Cleanse around the scalp laceration with saline; do not scrub the suture line.  Try to remove as much of the dried blood as possible. Then apply 2% mupirocin ointment twice daily. Monitor the wound area(s) for worsening of condition such as: Signs/symptoms of infection,  Increase in size,  Development of or worsening of odor, Development of pain, or increased pain at the affected locations.  Notify the medical team if any of these develop.  Thank you for the consult.  Discussed plan of care with the bedside nurse.  [place] nurse will not follow at this time.  Please re-consult the [place] team if needed.  Val Riles, RN, MSN, CWOCN, CNS-BC, pager [phone]

## 2019-01-11 NOTE — Progress Notes (Signed)
Initial Nutrition Assessment  INTERVENTION:   -Provide Ensure Enlive po BID, each supplement provides 350 kcal and 20 grams of protein -Multivitamin with minerals daily -Recommend weight measurement for this admission (last recorded 06/30/18)  NUTRITION DIAGNOSIS:   Inadequate oral intake related to poor appetite(ETOH abuse) as evidenced by per patient/family report.  GOAL:   Patient will meet greater than or equal to 90% of their needs  MONITOR:   PO intake, Supplement acceptance, Labs, Weight trends, I & O's, Skin  REASON FOR ASSESSMENT:   Malnutrition Screening Tool   ASSESSMENT:   48 y.o. male with medical history significant of schizoaffective disorder, PTSD, chronic alcohol abuse, hypertension, GERD, gout, depression with anxiety, tobacco abuse,TBI, who presents with fall.  **RD working remotely**  Patient seen by nutrition staff in the past for ETOH abuse. Pt continues to drink, reports 20 beers daily. Pt doesn't eat well when he drinks. Pt ate 75% of breakfast this morning providing ~375 kcal, 21g protein. Patient would benefit from protein supplements given increased needs and poor PO intake. RD to order Ensure supplements and daily MVI. Will also need a new weight measurement in order to calculate estimated needs.  No weight measured for this admission. Last recorded 06/30/18 (219 lbs).  Medications: K-DUR tablet daily, Thiamine tablet daily, IV KCl Labs reviewed: Low K, Na, Mg Phos WNL   NUTRITION - FOCUSED PHYSICAL EXAM:  Unable to perform per department requirements to work remotely.  Diet Order:   Diet Order            Diet Heart Room service appropriate? Yes; Fluid consistency: Thin; Fluid restriction: 1200 mL Fluid  Diet effective now              EDUCATION NEEDS:   No education needs have been identified at this time  Skin:  Skin Assessment: Skin Integrity Issues: Skin Integrity Issues:: Other (Comment) Other: scalp laceration from  fall  Last BM:  PTA  Height:   Ht Readings from Last 1 Encounters:  06/30/18 6' (1.829 m)    Weight:   Wt Readings from Last 1 Encounters:  06/30/18 99.6 kg    Ideal Body Weight:  78.1 kg(using ht from 06/03/18) 5'11"  BMI:  There is no height or weight on file to calculate BMI.  Estimated Nutritional Needs:   Kcal:  unable to calculate  Protein:     Fluid:     Clayton Bibles, MS, RD, LDN [place] Dietitian Pager: [phone] After Hours Pager: [phone]

## 2019-01-12 DIAGNOSIS — R945 Abnormal results of liver function studies: Secondary | ICD-10-CM

## 2019-01-12 DIAGNOSIS — K219 Gastro-esophageal reflux disease without esophagitis: Secondary | ICD-10-CM

## 2019-01-12 DIAGNOSIS — F10129 Alcohol abuse with intoxication, unspecified: Secondary | ICD-10-CM

## 2019-01-12 LAB — COMPREHENSIVE METABOLIC PANEL
ALT: 418 U/L — ABNORMAL HIGH (ref 0–44)
AST: 585 U/L — ABNORMAL HIGH (ref 15–41)
Albumin: 3.3 g/dL — ABNORMAL LOW (ref 3.5–5.0)
Alkaline Phosphatase: 107 U/L (ref 38–126)
Anion gap: 7 (ref 5–15)
BUN: 12 mg/dL (ref 6–20)
CO2: 28 mmol/L (ref 22–32)
Calcium: 8.5 mg/dL — ABNORMAL LOW (ref 8.9–10.3)
Chloride: 96 mmol/L — ABNORMAL LOW (ref 98–111)
Creatinine, Ser: 0.82 mg/dL (ref 0.61–1.24)
GFR calc Af Amer: >60 mL/min (ref >60)
GFR calc non Af Amer: >60 mL/min (ref >60)
Glucose, Bld: 89 mg/dL (ref 70–99)
Potassium: 3.8 mmol/L (ref 3.5–5.1)
Sodium: 131 mmol/L — ABNORMAL LOW (ref 135–145)
Total Bilirubin: 1.5 mg/dL — ABNORMAL HIGH (ref 0.3–1.2)
Total Protein: 5.9 g/dL — ABNORMAL LOW (ref 6.5–8.1)

## 2019-01-12 LAB — HEPATITIS PANEL, ACUTE
HCV Ab: <0.1 s/co ratio (ref 0.0–0.9)
Hep A IgM: NEGATIVE
Hep B C IgM: NEGATIVE
Hepatitis B Surface Ag: NEGATIVE

## 2019-01-12 LAB — GLUCOSE, CAPILLARY: Glucose-Capillary: 91 mg/dL (ref 70–99)

## 2019-01-12 LAB — HIV ANTIBODY (ROUTINE TESTING W REFLEX): HIV Screen 4th Generation wRfx: NONREACTIVE

## 2019-01-12 MED ORDER — ADULT MULTIVITAMIN W/MINERALS CH
1.0000 | ORAL_TABLET | Freq: Every day | ORAL | Status: DC
  Administered 2019-01-13 – 2019-01-15 (×3): 1 via ORAL
  Filled 2019-01-12 (×3): qty 1

## 2019-01-12 MED ORDER — THIAMINE HCL 100 MG/ML IJ SOLN: 100.0000 mg | Freq: Every day | INTRAMUSCULAR | Status: DC

## 2019-01-12 MED ORDER — LORAZEPAM 1 MG PO TABS
1.0000 mg | ORAL_TABLET | Freq: Four times a day (QID) | ORAL | Status: AC | PRN
  Administered 2019-01-12 – 2019-01-14 (×4): 1 mg via ORAL
  Filled 2019-01-12 (×4): qty 1

## 2019-01-12 MED ORDER — CLONIDINE HCL 0.1 MG PO TABS: 0.1000 mg | ORAL_TABLET | Freq: Four times a day (QID) | ORAL | Status: DC | PRN

## 2019-01-12 MED ORDER — VITAMIN B-1 100 MG PO TABS
100.0000 mg | ORAL_TABLET | Freq: Every day | ORAL | Status: DC
  Administered 2019-01-13 – 2019-01-15 (×3): 100 mg via ORAL
  Filled 2019-01-12 (×3): qty 1

## 2019-01-12 MED ORDER — CHLORDIAZEPOXIDE HCL 25 MG PO CAPS
25.0000 mg | ORAL_CAPSULE | Freq: Three times a day (TID) | ORAL | Status: AC
  Administered 2019-01-12 – 2019-01-13 (×6): 25 mg via ORAL
  Filled 2019-01-12 (×6): qty 1

## 2019-01-12 MED ORDER — HYDRALAZINE HCL 20 MG/ML IJ SOLN: 5.0000 mg | INTRAMUSCULAR | Status: DC | PRN

## 2019-01-12 MED ORDER — LORAZEPAM 2 MG/ML IJ SOLN: 1.0000 mg | Freq: Four times a day (QID) | INTRAMUSCULAR | Status: AC | PRN

## 2019-01-12 NOTE — Progress Notes (Signed)
PROGRESS NOTE    [name]  AYT:[id number] DOB: Nov 30, 1970 DOA: 01/10/2019 PCP: Scot Jun, FNP   Brief Narrative:  Patient is a 48 year old male with history of seizures affective disorder, PTSD, chronic alcohol abuse, hypertension, GERD, smoker, gout, depression with anxiety who was brought to the emergency department after he was found to have fallen on the street.  On presentation, patient was found to have severe hyponatremia, Severe hypokalemia.  He had laceration of scalp on the right side.  He was acutely intoxicated with alcohol with elevated blood alcohol level.  Currently being managed for hyponatremia, potential severe alcohol withdrawal.  Consultants:   None  Procedures:   None  Antimicrobials:   None   Subjective: Patient seen and examined.  Complains of anxiety, headache and right side of the body pain.  No other complaint.  Last drink was almost 48 hours ago.  He has been drinking heavily daily since about over 3 months now.  Objective: Vitals:   01/11/19 1145 01/11/19 2327 01/12/19 0539 01/12/19 0818  BP: 133/85 129/87 (!) 131/93   Pulse: (!) 102 77 80   Resp: 18 18 17    Temp: 98 F (36.7 C) 97.9 F (36.6 C) 98.2 F (36.8 C)   TempSrc: Oral     SpO2: 96% 97% 96% 93%  Weight:   96.4 kg     Intake/Output Summary (Last 24 hours) at 01/12/2019 1018 Last data filed at 01/12/2019 0558 Gross per 24 hour  Intake 240 ml  Output 550 ml  Net -310 ml   Filed Weights   01/12/19 0539  Weight: 96.4 kg    Examination:  General exam: Appears calm and comfortable with fine tremors upper extremities Respiratory system: Clear to auscultation. Respiratory effort normal. Cardiovascular system: S1 & S2 heard, RRR. No JVD, murmurs, rubs, gallops or clicks. No pedal edema. Gastrointestinal system: Abdomen is nondistended, soft and nontender. No organomegaly or masses felt. Normal bowel sounds heard. Central nervous system: Alert and oriented. No focal  neurological deficits. Extremities: Symmetric 5 x 5 power.  Fine tremors of upper extremities Skin: No rashes, lesions or ulcers Psychiatry: Judgement and insight appear normal. Mood & affect appropriate.    Data Reviewed: I have personally reviewed following labs and imaging studies  CBC: Recent Labs  Lab 01/10/19 2023 01/11/19 0539  WBC 4.0 3.8*  NEUTROABS 2.4  --   HGB 13.9 13.3  HCT 38.0* 36.8*  MCV 91.1 92.5  PLT 149* 355*   Basic Metabolic Panel: Recent Labs  Lab 01/10/19 2023 01/10/19 2236 01/10/19 2300 01/11/19 0539 01/12/19 0612  NA 118* 120*  --  124* 131*  K 2.5* 2.2*  --  3.4* 3.8  CL 71* 76*  --  82* 96*  CO2 29 24  --  28 28  GLUCOSE 141* 113*  --  100* 89  BUN <5* <5*  --  5* 12  CREATININE 0.71 0.52*  --  0.71 0.82  CALCIUM 8.7* 8.0*  --  8.8* 8.5*  MG  --  1.5*  --   --   --   PHOS  --   --  3.1  --   --    GFR: CrCl cannot be calculated (Unknown ideal weight.). Liver Function Tests: Recent Labs  Lab 01/10/19 2023 01/12/19 0612  AST 619* 585*  ALT 369* 418*  ALKPHOS 99 107  BILITOT 1.1 1.5*  PROT 6.7 5.9*  ALBUMIN 3.9 3.3*   No results for input(s): LIPASE, AMYLASE in the  last 168 hours. No results for input(s): AMMONIA in the last 168 hours. Coagulation Profile: Recent Labs  Lab 01/10/19 2236  INR 0.9   Cardiac Enzymes: No results for input(s): CKTOTAL, CKMB, CKMBINDEX, TROPONINI in the last 168 hours. BNP (last 3 results) No results for input(s): PROBNP in the last 8760 hours. HbA1C: No results for input(s): HGBA1C in the last 72 hours. CBG: Recent Labs  Lab 01/11/19 0720 01/12/19 0747  GLUCAP 97 91   Lipid Profile: No results for input(s): CHOL, HDL, LDLCALC, TRIG, CHOLHDL, LDLDIRECT in the last 72 hours. Thyroid Function Tests: Recent Labs    01/11/19 0539  TSH 0.811   Anemia Panel: No results for input(s): VITAMINB12, FOLATE, FERRITIN, TIBC, IRON, RETICCTPCT in the last 72 hours. Sepsis Labs: No results for  input(s): PROCALCITON, LATICACIDVEN in the last 168 hours.  Recent Results (from the past 240 hour(s))  SARS Coronavirus 2 [facility] order, Performed in [facility] hospital lab)     Status: None   Collection Time: 01/10/19 10:43 PM  Result Value Ref Range Status   SARS Coronavirus 2 NEGATIVE NEGATIVE Final    Comment: (NOTE) If result is NEGATIVE SARS-CoV-2 target nucleic acids are NOT DETECTED. The SARS-CoV-2 RNA is generally detectable in upper and lower  respiratory specimens during the acute phase of infection. The lowest  concentration of SARS-CoV-2 viral copies this assay can detect is 250  copies / mL. A negative result does not preclude SARS-CoV-2 infection  and should not be used as the sole basis for treatment or other  patient management decisions.  A negative result may occur with  improper specimen collection / handling, submission of specimen other  than nasopharyngeal swab, presence of viral mutation(s) within the  areas targeted by this assay, and inadequate number of viral copies  (<250 copies / mL). A negative result must be combined with clinical  observations, patient history, and epidemiological information. If result is POSITIVE SARS-CoV-2 target nucleic acids are DETECTED. The SARS-CoV-2 RNA is generally detectable in upper and lower  respiratory specimens dur ing the acute phase of infection.  Positive  results are indicative of active infection with SARS-CoV-2.  Clinical  correlation with patient history and other diagnostic information is  necessary to determine patient infection status.  Positive results do  not rule out bacterial infection or co-infection with other viruses. If result is PRESUMPTIVE POSTIVE SARS-CoV-2 nucleic acids MAY BE PRESENT.   A presumptive positive result was obtained on the submitted specimen  and confirmed on repeat testing.  While 2019 novel coronavirus  (SARS-CoV-2) nucleic acids may be present in the submitted sample    additional confirmatory testing may be necessary for epidemiological  and / or clinical management purposes  to differentiate between  SARS-CoV-2 and other Sarbecovirus currently known to infect humans.  If clinically indicated additional testing with an alternate test  methodology [phone]) is advised. The SARS-CoV-2 RNA is generally  detectable in upper and lower respiratory sp ecimens during the acute  phase of infection. The expected result is Negative. Fact Sheet for Patients:  StrictlyIdeas.no Fact Sheet for Healthcare Providers: BankingDealers.co.za This test is not yet approved or cleared by the [place] FDA and has been authorized for detection and/or diagnosis of SARS-CoV-2 by FDA under an Emergency Use Authorization (EUA).  This EUA will remain in effect (meaning this test can be used) for the duration of the COVID-19 declaration under Section 564(b)(1) of the Act, 21 U.S.C. section 360bbb-3(b)(1), unless the authorization is terminated  or revoked sooner. Performed at [facility], [place] [address] [place], [place] [address]       Radiology Studies: Ct Head Wo Contrast  Result Date: 01/10/2019 CLINICAL DATA:  Pain status post fall. Lacerations to temporal region. EXAM: CT HEAD WITHOUT CONTRAST CT CERVICAL SPINE WITHOUT CONTRAST TECHNIQUE: Multidetector CT imaging of the head and cervical spine was performed following the standard protocol without intravenous contrast. Multiplanar CT image reconstructions of the cervical spine were also generated. COMPARISON:  04/23/2015 FINDINGS: CT HEAD FINDINGS Brain: No evidence of acute infarction, hemorrhage, hydrocephalus, extra-axial collection or mass lesion/mass effect. Vascular: No hyperdense vessel or unexpected calcification. Skull: Normal. Negative for fracture or focal lesion. There is right frontal and temporal scalp swelling without evidence of an underlying  fracture. There is some subcutaneous gas likely related to a laceration in this region. Sinuses/Orbits: No acute finding. Mucosal thickening of the right maxillary sinus is noted. Other: None. CT CERVICAL SPINE FINDINGS Alignment: There is straightening of the normal cervical lordotic curvature which is nonspecific. Skull base and vertebrae: No acute fracture. No primary bone lesion or focal pathologic process. Soft tissues and spinal canal: No prevertebral fluid or swelling. No visible canal hematoma. Disc levels:  The disc heights are relatively well maintained. Upper chest: Negative. Other: None IMPRESSION: 1. No acute intracranial abnormality detected. 2. No acute cervical spine fracture. 3. Right temporal scalp swelling and lacerations without evidence of underlying calvarial defect. Electronically Signed   By: Constance Holster M.D.   On: 01/10/2019 21:31   Ct Cervical Spine Wo Contrast  Result Date: 01/10/2019 CLINICAL DATA:  Pain status post fall. Lacerations to temporal region. EXAM: CT HEAD WITHOUT CONTRAST CT CERVICAL SPINE WITHOUT CONTRAST TECHNIQUE: Multidetector CT imaging of the head and cervical spine was performed following the standard protocol without intravenous contrast. Multiplanar CT image reconstructions of the cervical spine were also generated. COMPARISON:  04/23/2015 FINDINGS: CT HEAD FINDINGS Brain: No evidence of acute infarction, hemorrhage, hydrocephalus, extra-axial collection or mass lesion/mass effect. Vascular: No hyperdense vessel or unexpected calcification. Skull: Normal. Negative for fracture or focal lesion. There is right frontal and temporal scalp swelling without evidence of an underlying fracture. There is some subcutaneous gas likely related to a laceration in this region. Sinuses/Orbits: No acute finding. Mucosal thickening of the right maxillary sinus is noted. Other: None. CT CERVICAL SPINE FINDINGS Alignment: There is straightening of the normal cervical lordotic  curvature which is nonspecific. Skull base and vertebrae: No acute fracture. No primary bone lesion or focal pathologic process. Soft tissues and spinal canal: No prevertebral fluid or swelling. No visible canal hematoma. Disc levels:  The disc heights are relatively well maintained. Upper chest: Negative. Other: None IMPRESSION: 1. No acute intracranial abnormality detected. 2. No acute cervical spine fracture. 3. Right temporal scalp swelling and lacerations without evidence of underlying calvarial defect. Electronically Signed   By: Constance Holster M.D.   On: 01/10/2019 21:31    Scheduled Meds:  allopurinol  200 mg Oral Daily   chlordiazePOXIDE  25 mg Oral TID   feeding supplement (ENSURE ENLIVE)  237 mL Oral BID BM   folic acid  1 mg Oral Daily   metoprolol tartrate  12.5 mg Oral BID   multivitamin with minerals  1 tablet Oral Daily   mupirocin ointment   Nasal BID   nicotine  21 mg Transdermal Daily   pantoprazole  40 mg Oral Daily   pneumococcal 23 valent vaccine  0.5 mL Intramuscular Tomorrow-1000  sertraline  75 mg Oral Daily   thiamine  100 mg Oral Daily   Or   thiamine  100 mg Intravenous Daily   Continuous Infusions:  sodium chloride 100 mL/hr at 01/11/19 1504     LOS: 2 days   Assessment & Plan:   Principal Problem:   Fall Active Problems:   Hypokalemia   Hyponatremia   Abnormal LFTs   Alcohol abuse with intoxication ([place])   Tobacco dependence due to cigarettes   Schizoaffective disorder, depressive type (H[facility])   Gout   GERD (gastroesophageal reflux disease)   HTN (hypertension)  Alcohol intoxication with acute withdrawal: I will stop his scheduled Ativan and instead start him on Librium 25 mg 3 times daily scheduled as well as as needed Ativan and continue CIWA protocol with multivitamins, folic acid and thiamine.  Hypertension: Pressure control.  Continue Lopressor.  Reduce parameters for IV PRN hydralazine and add as needed clonidine.  Acute  hyponatremia: Getting better.  Came in with 118.  Currently 133.  Continue normal saline for dehydration.  Hypokalemia: Resolved.  Fall resulting in scalp laceration: Most likely secondary to alcohol intoxication.  Has a staples in the head.  Slightly tender to palpation.  Alcoholic liver disease: Typical alcoholic liver disease pattern.  LFTs improving.  GERD: Continue PPI  Schizoaffective disorder/anxiety/depression: Continue home dose of sertraline.  Tobacco abuse: Smokes a pack of day since last several years.  Counseled for smoking cessation.  Continue nicotine patch.  DVT prophylaxis: SCD Code Status: Full code Family Communication: Discussed with patient.  No family involved. Disposition Plan: Pending clinical course.  I anticipate full-blown withdrawal syndrome on this gentleman.   Time spent: 30 minutes   Darliss Cheney, MD [facility] Pager [phone]  If 7PM-7AM, please contact night-coverage [email or web address] Password [email or web address] 01/12/2019, 10:18 AM

## 2019-01-12 NOTE — Evaluation (Signed)
Physical Therapy Evaluation Patient Details Name: [name] MRN: [id number] DOB: 1971-04-19 Today's Date: 01/12/2019   History of Present Illness  48 yo male admitted for fall secondary to alcohol intoxication, sustained head trauma with multiple staples located on R side of head. Pt being monitored for alcohol withdrawal, encephalopathy. PMH includes seizures, PTSD, alcohol abuse, schizoaffective disorder, HTN, GERD, smoker, gout, depression/anxiety.  Clinical Impression   Pt presents with tremors, generalized weakness, impaired cognition with + head trauma with fall, unsteady gait especially with challenges to gait, and decreased activity tolerance. Pt to benefit from acute PT to address deficits. Pt ambulated hallway distance with RW, with 2 LOB with changes in direction and turning. Pt recommending HHPT for follow up, will consult OT as well. PT to progress mobility as tolerated, and will continue to follow acutely.      Follow Up Recommendations Home health PT;Supervision for mobility/OOB    Equipment Recommendations  Rolling walker with 5" wheels    Recommendations for Other Services OT consult     Precautions / Restrictions Precautions Precautions: Fall Restrictions Weight Bearing Restrictions: No      Mobility  Bed Mobility Overal bed mobility: Needs Assistance Bed Mobility: Supine to Sit     Supine to sit: Supervision;HOB elevated     General bed mobility comments: Supervision for safety, increased time to come to sitting.   Transfers Overall transfer level: Needs assistance Equipment used: Rolling walker (2 wheeled) Transfers: Sit to/from Stand Sit to Stand: Min assist;From elevated surface;Min guard         General transfer comment: Sit to stand x2 during session, requiring min assist initially for power up and steadying and min guard on second stand for safety. Verbal cuing for hand placement each time pt performed  sit<>stand.  Ambulation/Gait Ambulation/Gait assistance: Min assist;+2 safety/equipment(chair follow) Gait Distance (Feet): 200 Feet Assistive device: Rolling walker (2 wheeled) Gait Pattern/deviations: Step-through pattern;Decreased stride length;Trunk flexed;Narrow base of support;Drifts right/left Gait velocity: decr    General Gait Details: Min assist for steadying, especially with turning or changing directions. Pt with 2 LOB due to scissoring during turns/changes of direction, corrected by pt and PT.   Stairs            Wheelchair Mobility    Modified Rankin (Stroke Patients Only)       Balance Overall balance assessment: Needs assistance;History of Falls Sitting-balance support: No upper extremity supported;Feet supported Sitting balance-Leahy Scale: Fair     Standing balance support: Bilateral upper extremity supported;During functional activity Standing balance-Leahy Scale: Poor Standing balance comment: reliant on RW and PT support              High level balance activites: Direction changes;Backward walking;Turns High Level Balance Comments: difficulty with backward walking, direction changes, turns; LOB and/or scissoring noted              Pertinent Vitals/Pain Pain Assessment: Faces Faces Pain Scale: Hurts little more Pain Location: head, R side. Pt reports headaches since fall, does not notice they get worse with light or cognitive stimulation Pain Descriptors / Indicators: Headache Pain Intervention(s): Monitored during session;Limited activity within patient's tolerance    Home Living Family/patient expects to be discharged to:: Private residence Living Arrangements: Alone Available Help at Discharge: Family;Friend(s);Available PRN/intermittently Type of Home: Apartment Home Access: Stairs to enter Entrance Stairs-Rails: Left Entrance Stairs-Number of Steps: flight Home Layout: One level Home Equipment: None      Prior Function Level  of Independence: Independent  Comments: Pt states he used RW after a previous fall for "some time", but did not use PTA. Pt did not disclose dates of previous fall, states one was very bad and he fell down a flight of stairs requiring extensive rehab.     Hand Dominance   Dominant Hand: Right    Extremity/Trunk Assessment   Upper Extremity Assessment Upper Extremity Assessment: Defer to OT evaluation    Lower Extremity Assessment Lower Extremity Assessment: Generalized weakness    Cervical / Trunk Assessment Cervical / Trunk Assessment: Normal  Communication   Communication: No difficulties  Cognition Arousal/Alertness: Awake/alert Behavior During Therapy: Flat affect Overall Cognitive Status: Impaired/Different from baseline Area of Impairment: Attention;Following commands;Safety/judgement;Problem solving                   Current Attention Level: Selective   Following Commands: Follows one step commands consistently;Follows one step commands with increased time Safety/Judgement: Decreased awareness of safety   Problem Solving: Slow processing;Requires verbal cues;Requires tactile cues        General Comments      Exercises     Assessment/Plan    PT Assessment Patient needs continued PT services  PT Problem List Decreased strength;Decreased mobility;Decreased safety awareness;Decreased range of motion;Decreased activity tolerance;Decreased balance;Pain;Decreased cognition       PT Treatment Interventions DME instruction;Functional mobility training;Balance training;Patient/family education;Gait training;Therapeutic activities;Stair training;Therapeutic exercise;Neuromuscular re-education    PT Goals (Current goals can be found in the Care Plan section)  Acute Rehab PT Goals Patient Stated Goal: get less shakey PT Goal Formulation: With patient Time For Goal Achievement: 01/26/19 Potential to Achieve Goals: Good    Frequency Min 3X/week    Barriers to discharge        Co-evaluation               AM-PAC PT "6 Clicks" Mobility  Outcome Measure Help needed turning from your back to your side while in a flat bed without using bedrails?: A Little Help needed moving from lying on your back to sitting on the side of a flat bed without using bedrails?: A Little Help needed moving to and from a bed to a chair (including a wheelchair)?: A Little Help needed standing up from a chair using your arms (e.g., wheelchair or bedside chair)?: A Little Help needed to walk in hospital room?: A Little Help needed climbing 3-5 steps with a railing? : A Lot 6 Click Score: 17    End of Session Equipment Utilized During Treatment: Gait belt Activity Tolerance: Patient tolerated treatment well Patient left: in chair;with call bell/phone within reach;with chair alarm set Nurse Communication: Mobility status PT Visit Diagnosis: Other abnormalities of gait and mobility (R26.89);Difficulty in walking, not elsewhere classified (R26.2)    Time: 6734-1937 PT Time Calculation (min) (ACUTE ONLY): 20 min   Charges:   PT Evaluation $PT Eval Low Complexity: 1 Low         Junita Kubota Conception Chancy, PT Acute Rehabilitation Services Pager [phone]  Office [phone]  Jerusalen Mateja D Elonda Husky 01/12/2019, 1:24 PM

## 2019-01-12 NOTE — TOC Progression Note (Signed)
Transition of Care [facility]) - Progression Note    Patient Details  Name: [name] MRN: [id number] Date of Birth: 08-29-1970  [facility]) CM/SW Contact  Leeroy Cha, RN Phone Number: 01/12/2019, 8:52 AM  Clinical Narrative:    Request for ltachs to look at patient for possible placement        Expected Discharge Plan and Services                                                 Social Determinants of Health (SDOH) Interventions    Readmission Risk Interventions No flowsheet data found.

## 2019-01-13 LAB — COMPREHENSIVE METABOLIC PANEL
ALT: 320 U/L — ABNORMAL HIGH (ref 0–44)
AST: 341 U/L — ABNORMAL HIGH (ref 15–41)
Albumin: 3 g/dL — ABNORMAL LOW (ref 3.5–5.0)
Alkaline Phosphatase: 108 U/L (ref 38–126)
Anion gap: 7 (ref 5–15)
BUN: 11 mg/dL (ref 6–20)
CO2: 22 mmol/L (ref 22–32)
Calcium: 8.2 mg/dL — ABNORMAL LOW (ref 8.9–10.3)
Chloride: 102 mmol/L (ref 98–111)
Creatinine, Ser: 0.73 mg/dL (ref 0.61–1.24)
GFR calc Af Amer: >60 mL/min (ref >60)
GFR calc non Af Amer: >60 mL/min (ref >60)
Glucose, Bld: 99 mg/dL (ref 70–99)
Potassium: 4.1 mmol/L (ref 3.5–5.1)
Sodium: 131 mmol/L — ABNORMAL LOW (ref 135–145)
Total Bilirubin: 0.9 mg/dL (ref 0.3–1.2)
Total Protein: 5.7 g/dL — ABNORMAL LOW (ref 6.5–8.1)

## 2019-01-13 LAB — CBC WITH DIFFERENTIAL/PLATELET
Abs Immature Granulocytes: 0.02 10*3/uL (ref 0.00–0.07)
Basophils Absolute: 0 10*3/uL (ref 0.0–0.1)
Basophils Relative: 0 %
Eosinophils Absolute: 0.1 10*3/uL (ref 0.0–0.5)
Eosinophils Relative: 2 %
HCT: 34.1 % — ABNORMAL LOW (ref 39.0–52.0)
Hemoglobin: 11.5 g/dL — ABNORMAL LOW (ref 13.0–17.0)
Immature Granulocytes: 0 %
Lymphocytes Relative: 29 %
Lymphs Abs: 1.4 10*3/uL (ref 0.7–4.0)
MCH: 33.6 pg (ref 26.0–34.0)
MCHC: 33.7 g/dL (ref 30.0–36.0)
MCV: 99.7 fL (ref 80.0–100.0)
Monocytes Absolute: 0.5 10*3/uL (ref 0.1–1.0)
Monocytes Relative: 11 %
Neutro Abs: 2.7 10*3/uL (ref 1.7–7.7)
Neutrophils Relative %: 58 %
Platelets: 163 10*3/uL (ref 150–400)
RBC: 3.42 MIL/uL — ABNORMAL LOW (ref 4.22–5.81)
RDW: 13.4 % (ref 11.5–15.5)
WBC: 4.7 10*3/uL (ref 4.0–10.5)
nRBC: 0 % (ref 0.0–0.2)

## 2019-01-13 LAB — MAGNESIUM: Magnesium: 1.9 mg/dL (ref 1.7–2.4)

## 2019-01-13 LAB — GLUCOSE, CAPILLARY: Glucose-Capillary: 86 mg/dL (ref 70–99)

## 2019-01-13 NOTE — Progress Notes (Signed)
PROGRESS NOTE    [name]  KPT:[id number] DOB: 06/01/1971 DOA: 01/10/2019 PCP: Scot Jun, FNP   Brief Narrative:  Patient is a 48 year old male with history of seizures affective disorder, PTSD, chronic alcohol abuse, hypertension, GERD, smoker, gout, depression with anxiety who was brought to the emergency department after he was found to have fallen on the street.  On presentation, patient was found to have severe hyponatremia, Severe hypokalemia.  He had laceration of scalp on the right side.  He was acutely intoxicated with alcohol with elevated blood alcohol level.  Currently being managed for hyponatremia and alcohol withdrawal.  Consultants:   None  Procedures:   None  Antimicrobials:   None   Subjective: Patient seen and examined.  He feels better than yesterday.  Still has tremors in upper extremities but no more headache.  No other complaint.  Objective: Vitals:   01/12/19 2008 01/12/19 2009 01/13/19 0513 01/13/19 1234  BP: (!) 143/95  131/86 128/89  Pulse: 76 80 71 71  Resp: 16 16 16 20   Temp: 98.4 F (36.9 C)  98.5 F (36.9 C) 98.6 F (37 C)  TempSrc: Oral  Oral Oral  SpO2: 95%  96% 98%  Weight:      Height:   5\' 10"  (1.778 m)     Intake/Output Summary (Last 24 hours) at 01/13/2019 1240 Last data filed at 01/13/2019 1219 Gross per 24 hour  Intake 6386.76 ml  Output 4050 ml  Net 2336.76 ml   Filed Weights   01/12/19 0539  Weight: 96.4 kg    Examination: General exam: Appears calm and comfortable with fine tremors in upper extremities Respiratory system: Clear to auscultation. Respiratory effort normal. Cardiovascular system: S1 & S2 heard, RRR. No JVD, murmurs, rubs, gallops or clicks. No pedal edema. Gastrointestinal system: Abdomen is nondistended, soft and nontender. No organomegaly or masses felt. Normal bowel sounds heard. Central nervous system: Alert and oriented. No focal neurological deficits. Extremities: Symmetric 5 x 5  power. Skin: No rashes, lesions or ulcers Psychiatry: Judgement and insight appear normal. Mood & affect appropriate.  Data Reviewed: I have personally reviewed following labs and imaging studies  CBC: Recent Labs  Lab 01/10/19 2023 01/11/19 0539 01/13/19 1114  WBC 4.0 3.8* 4.7  NEUTROABS 2.4  --  2.7  HGB 13.9 13.3 11.5*  HCT 38.0* 36.8* 34.1*  MCV 91.1 92.5 99.7  PLT 149* 139* 170   Basic Metabolic Panel: Recent Labs  Lab 01/10/19 2023 01/10/19 2236 01/10/19 2300 01/11/19 0539 01/12/19 0612 01/13/19 1114  NA 118* 120*  --  124* 131* 131*  K 2.5* 2.2*  --  3.4* 3.8 4.1  CL 71* 76*  --  82* 96* 102  CO2 29 24  --  28 28 22   GLUCOSE 141* 113*  --  100* 89 99  BUN <5* <5*  --  5* 12 11  CREATININE 0.71 0.52*  --  0.71 0.82 0.73  CALCIUM 8.7* 8.0*  --  8.8* 8.5* 8.2*  MG  --  1.5*  --   --   --  1.9  PHOS  --   --  3.1  --   --   --    GFR: Estimated Creatinine Clearance: 134.5 mL/min (by C-G formula based on SCr of 0.73 mg/dL). Liver Function Tests: Recent Labs  Lab 01/10/19 2023 01/12/19 0612 01/13/19 1114  AST 619* 585* 341*  ALT 369* 418* 320*  ALKPHOS 99 107 108  BILITOT 1.1 1.5* 0.9  PROT 6.7 5.9* 5.7*  ALBUMIN 3.9 3.3* 3.0*   No results for input(s): LIPASE, AMYLASE in the last 168 hours. No results for input(s): AMMONIA in the last 168 hours. Coagulation Profile: Recent Labs  Lab 01/10/19 2236  INR 0.9   Cardiac Enzymes: No results for input(s): CKTOTAL, CKMB, CKMBINDEX, TROPONINI in the last 168 hours. BNP (last 3 results) No results for input(s): PROBNP in the last 8760 hours. HbA1C: No results for input(s): HGBA1C in the last 72 hours. CBG: Recent Labs  Lab 01/11/19 0720 01/12/19 0747 01/13/19 0729  GLUCAP 97 91 86   Lipid Profile: No results for input(s): CHOL, HDL, LDLCALC, TRIG, CHOLHDL, LDLDIRECT in the last 72 hours. Thyroid Function Tests: Recent Labs    01/11/19 0539  TSH 0.811   Anemia Panel: No results for input(s):  VITAMINB12, FOLATE, FERRITIN, TIBC, IRON, RETICCTPCT in the last 72 hours. Sepsis Labs: No results for input(s): PROCALCITON, LATICACIDVEN in the last 168 hours.  Recent Results (from the past 240 hour(s))  SARS Coronavirus 2 [facility] order, Performed in [place] hospital lab)     Status: None   Collection Time: 01/10/19 10:43 PM  Result Value Ref Range Status   SARS Coronavirus 2 NEGATIVE NEGATIVE Final    Comment: (NOTE) If result is NEGATIVE SARS-CoV-2 target nucleic acids are NOT DETECTED. The SARS-CoV-2 RNA is generally detectable in upper and lower  respiratory specimens during the acute phase of infection. The lowest  concentration of SARS-CoV-2 viral copies this assay can detect is 250  copies / mL. A negative result does not preclude SARS-CoV-2 infection  and should not be used as the sole basis for treatment or other  patient management decisions.  A negative result may occur with  improper specimen collection / handling, submission of specimen other  than nasopharyngeal swab, presence of viral mutation(s) within the  areas targeted by this assay, and inadequate number of viral copies  (<250 copies / mL). A negative result must be combined with clinical  observations, patient history, and epidemiological information. If result is POSITIVE SARS-CoV-2 target nucleic acids are DETECTED. The SARS-CoV-2 RNA is generally detectable in upper and lower  respiratory specimens dur ing the acute phase of infection.  Positive  results are indicative of active infection with SARS-CoV-2.  Clinical  correlation with patient history and other diagnostic information is  necessary to determine patient infection status.  Positive results do  not rule out bacterial infection or co-infection with other viruses. If result is PRESUMPTIVE POSTIVE SARS-CoV-2 nucleic acids MAY BE PRESENT.   A presumptive positive result was obtained on the submitted specimen  and confirmed on repeat testing.   While 2019 novel coronavirus  (SARS-CoV-2) nucleic acids may be present in the submitted sample  additional confirmatory testing may be necessary for epidemiological  and / or clinical management purposes  to differentiate between  SARS-CoV-2 and other Sarbecovirus currently known to infect humans.  If clinically indicated additional testing with an alternate test  methodology [phone]) is advised. The SARS-CoV-2 RNA is generally  detectable in upper and lower respiratory sp ecimens during the acute  phase of infection. The expected result is Negative. Fact Sheet for Patients:  StrictlyIdeas.no Fact Sheet for Healthcare Providers: BankingDealers.co.za This test is not yet approved or cleared by the [place] FDA and has been authorized for detection and/or diagnosis of SARS-CoV-2 by FDA under an Emergency Use Authorization (EUA).  This EUA will remain in effect (meaning this test can be used) for  the duration of the COVID-19 declaration under Section 564(b)(1) of the Act, 21 U.S.C. section 360bbb-3(b)(1), unless the authorization is terminated or revoked sooner. Performed at [facility], [place] [address] [place], [place] [address]       Radiology Studies: No results found.  Scheduled Meds: . allopurinol  200 mg Oral Daily  . chlordiazePOXIDE  25 mg Oral TID  . feeding supplement (ENSURE ENLIVE)  237 mL Oral BID BM  . folic acid  1 mg Oral Daily  . metoprolol tartrate  12.5 mg Oral BID  . multivitamin with minerals  1 tablet Oral Daily  . mupirocin ointment   Nasal BID  . nicotine  21 mg Transdermal Daily  . pantoprazole  40 mg Oral Daily  . pneumococcal 23 valent vaccine  0.5 mL Intramuscular Tomorrow-1000  . sertraline  75 mg Oral Daily  . thiamine  100 mg Oral Daily   Or  . thiamine  100 mg Intravenous Daily   Continuous Infusions: . sodium chloride 100 mL/hr at 01/13/19 1135     LOS: 3 days    Assessment & Plan:   Principal Problem:   Fall Active Problems:   Hypokalemia   Hyponatremia   Abnormal LFTs   Alcohol abuse with intoxication ([place])   Tobacco dependence due to cigarettes   Schizoaffective disorder, depressive type (HCC)   Gout   GERD (gastroesophageal reflux disease)   HTN (hypertension)  Alcohol intoxication with acute withdrawal: Much better, CIWA also better than yesterday.  Continue scheduled Librium and as needed Ativan with multivitamin, thiamine and folate.  Hypertension: Pressure control.  Continue Lopressor and IV PRN hydralazine and add as needed clonidine.  Acute hyponatremia: Getting better.  Came in with 118.  Currently 131.   Hypokalemia: Resolved.  Fall resulting in scalp laceration: Most likely secondary to alcohol intoxication.  Has a staples in the head.  Slightly tender to palpation.  Alcoholic liver disease: Typical alcoholic liver disease pattern.  LFTs improving.  GERD: Continue PPI  Schizoaffective disorder/anxiety/depression: Continue home dose of sertraline.  Tobacco abuse: Smokes a pack of day since last several years.  Counseled for smoking cessation.  Continue nicotine patch.  DVT prophylaxis: SCD Code Status: Full code Family Communication: Discussed with patient.  No family involved. Disposition Plan: Pending clinical course.  Anticipate discharge on Saturday.  Time spent: 28 minutes   Darliss Cheney, MD [facility] Pager [phone]  If 7PM-7AM, please contact night-coverage [email or web address] Password [facility] 01/13/2019, 12:40 PM

## 2019-01-13 NOTE — TOC Progression Note (Signed)
Transition of Care [facility]) - Progression Note    Patient Details  Name: Rowdy Guerrini MRN: [id number] Date of Birth: Mar 29, 1971  Transition of Care [facility]) CM/SW Contact  Leeroy Cha, RN Phone Number: 01/13/2019, 7:47 AM  Clinical Narrative:    Per both [facility] LTACHs patient does not meet criteria        Expected Discharge Plan and Services                                                 Social Determinants of Health (SDOH) Interventions    Readmission Risk Interventions No flowsheet data found.

## 2019-01-13 NOTE — Care Management Important Message (Signed)
Important Message  Patient Details IM letter given to Velva Harman Rn to present to the Patient Name: [name] MRN: [id number] Date of Birth: 1971/04/28   Medicare Important Message Given:  Yes    Kerin Salen 01/13/2019, 10:54 AM

## 2019-01-14 LAB — CBC WITH DIFFERENTIAL/PLATELET
Abs Immature Granulocytes: 0.02 10*3/uL (ref 0.00–0.07)
Basophils Absolute: 0 10*3/uL (ref 0.0–0.1)
Basophils Relative: 1 %
Eosinophils Absolute: 0.1 10*3/uL (ref 0.0–0.5)
Eosinophils Relative: 2 %
HCT: 35.5 % — ABNORMAL LOW (ref 39.0–52.0)
Hemoglobin: 11.9 g/dL — ABNORMAL LOW (ref 13.0–17.0)
Immature Granulocytes: 0 %
Lymphocytes Relative: 36 %
Lymphs Abs: 1.7 10*3/uL (ref 0.7–4.0)
MCH: 33.4 pg (ref 26.0–34.0)
MCHC: 33.5 g/dL (ref 30.0–36.0)
MCV: 99.7 fL (ref 80.0–100.0)
Monocytes Absolute: 0.6 10*3/uL (ref 0.1–1.0)
Monocytes Relative: 13 %
Neutro Abs: 2.2 10*3/uL (ref 1.7–7.7)
Neutrophils Relative %: 48 %
Platelets: 192 10*3/uL (ref 150–400)
RBC: 3.56 MIL/uL — ABNORMAL LOW (ref 4.22–5.81)
RDW: 13.6 % (ref 11.5–15.5)
WBC: 4.6 10*3/uL (ref 4.0–10.5)
nRBC: 0 % (ref 0.0–0.2)

## 2019-01-14 LAB — COMPREHENSIVE METABOLIC PANEL
ALT: 325 U/L — ABNORMAL HIGH (ref 0–44)
AST: 298 U/L — ABNORMAL HIGH (ref 15–41)
Albumin: 3.4 g/dL — ABNORMAL LOW (ref 3.5–5.0)
Alkaline Phosphatase: 113 U/L (ref 38–126)
Anion gap: 7 (ref 5–15)
BUN: 13 mg/dL (ref 6–20)
CO2: 22 mmol/L (ref 22–32)
Calcium: 8.8 mg/dL — ABNORMAL LOW (ref 8.9–10.3)
Chloride: 105 mmol/L (ref 98–111)
Creatinine, Ser: 0.71 mg/dL (ref 0.61–1.24)
GFR calc Af Amer: >60 mL/min (ref >60)
GFR calc non Af Amer: >60 mL/min (ref >60)
Glucose, Bld: 90 mg/dL (ref 70–99)
Potassium: 4.1 mmol/L (ref 3.5–5.1)
Sodium: 134 mmol/L — ABNORMAL LOW (ref 135–145)
Total Bilirubin: 1.1 mg/dL (ref 0.3–1.2)
Total Protein: 6.2 g/dL — ABNORMAL LOW (ref 6.5–8.1)

## 2019-01-14 LAB — GLUCOSE, CAPILLARY: Glucose-Capillary: 90 mg/dL (ref 70–99)

## 2019-01-14 MED ORDER — CHLORDIAZEPOXIDE HCL 25 MG PO CAPS
25.0000 mg | ORAL_CAPSULE | Freq: Two times a day (BID) | ORAL | Status: AC
  Administered 2019-01-14 – 2019-01-15 (×3): 25 mg via ORAL
  Filled 2019-01-14 (×3): qty 1

## 2019-01-14 NOTE — Progress Notes (Signed)
PROGRESS NOTE    [name]  CVE:[id number] DOB: Jun 29, 1971 DOA: 01/10/2019 PCP: Scot Jun, FNP   Brief Narrative:  Patient is a 48 year old male with history of seizures affective disorder, PTSD, chronic alcohol abuse, hypertension, GERD, smoker, gout, depression with anxiety who was brought to the emergency department after he was found to have fallen on the street.  On presentation, patient was found to have severe hyponatremia, Severe hypokalemia.  He had laceration of scalp on the right side.  He was acutely intoxicated with alcohol with elevated blood alcohol level.  Currently being managed for hyponatremia and alcohol withdrawal.  Consultants:   None  Procedures:   None  Antimicrobials:   None   Subjective: Patient seen and examined.  He states that he feels much better today.  He still has tremors but they are very fine compared to gross that he had yesterday.  No complaint.  No headache.  Objective: Vitals:   01/13/19 2026 01/14/19 0053 01/14/19 0446 01/14/19 1218  BP: (!) 145/94 136/85 (!) 145/97 (!) 123/92  Pulse: 70 71 61 75  Resp: 17 17 17 20   Temp: 98.4 F (36.9 C) 98.3 F (36.8 C) 97.8 F (36.6 C) 98.9 F (37.2 C)  TempSrc: Oral Oral Oral Oral  SpO2: 97% 96% 95% 97%  Weight:      Height:        Intake/Output Summary (Last 24 hours) at 01/14/2019 1407 Last data filed at 01/14/2019 0258 Gross per 24 hour  Intake 2850.86 ml  Output 2175 ml  Net 675.86 ml   Filed Weights   01/12/19 0539  Weight: 96.4 kg    Examination:  General exam: Appears calm and comfortable with very fine upper extremity tremors Respiratory system: Clear to auscultation. Respiratory effort normal. Cardiovascular system: S1 & S2 heard, RRR. No JVD, murmurs, rubs, gallops or clicks. No pedal edema. Gastrointestinal system: Abdomen is nondistended, soft and nontender. No organomegaly or masses felt. Normal bowel sounds heard. Central nervous system: Alert and  oriented. No focal neurological deficits. Extremities: Symmetric 5 x 5 power. Skin: No rashes, lesions or ulcers Psychiatry: Judgement and insight appear normal. Mood & affect appropriate.  Data Reviewed: I have personally reviewed following labs and imaging studies  CBC: Recent Labs  Lab 01/10/19 2023 01/11/19 0539 01/13/19 1114 01/14/19 0606  WBC 4.0 3.8* 4.7 4.6  NEUTROABS 2.4  --  2.7 2.2  HGB 13.9 13.3 11.5* 11.9*  HCT 38.0* 36.8* 34.1* 35.5*  MCV 91.1 92.5 99.7 99.7  PLT 149* 139* 163 527   Basic Metabolic Panel: Recent Labs  Lab 01/10/19 2236 01/10/19 2300 01/11/19 0539 01/12/19 0612 01/13/19 1114 01/14/19 0606  NA 120*  --  124* 131* 131* 134*  K 2.2*  --  3.4* 3.8 4.1 4.1  CL 76*  --  82* 96* 102 105  CO2 24  --  28 28 22 22   GLUCOSE 113*  --  100* 89 99 90  BUN <5*  --  5* 12 11 13   CREATININE 0.52*  --  0.71 0.82 0.73 0.71  CALCIUM 8.0*  --  8.8* 8.5* 8.2* 8.8*  MG 1.5*  --   --   --  1.9  --   PHOS  --  3.1  --   --   --   --    GFR: Estimated Creatinine Clearance: 134.5 mL/min (by C-G formula based on SCr of 0.71 mg/dL). Liver Function Tests: Recent Labs  Lab 01/10/19 2023 01/12/19 0612 01/13/19  1114 01/14/19 0606  AST 619* 585* 341* 298*  ALT 369* 418* 320* 325*  ALKPHOS 99 107 108 113  BILITOT 1.1 1.5* 0.9 1.1  PROT 6.7 5.9* 5.7* 6.2*  ALBUMIN 3.9 3.3* 3.0* 3.4*   No results for input(s): LIPASE, AMYLASE in the last 168 hours. No results for input(s): AMMONIA in the last 168 hours. Coagulation Profile: Recent Labs  Lab 01/10/19 2236  INR 0.9   Cardiac Enzymes: No results for input(s): CKTOTAL, CKMB, CKMBINDEX, TROPONINI in the last 168 hours. BNP (last 3 results) No results for input(s): PROBNP in the last 8760 hours. HbA1C: No results for input(s): HGBA1C in the last 72 hours. CBG: Recent Labs  Lab 01/11/19 0720 01/12/19 0747 01/13/19 0729 01/14/19 0740  GLUCAP 97 91 86 90   Lipid Profile: No results for input(s): CHOL,  HDL, LDLCALC, TRIG, CHOLHDL, LDLDIRECT in the last 72 hours. Thyroid Function Tests: No results for input(s): TSH, T4TOTAL, FREET4, T3FREE, THYROIDAB in the last 72 hours. Anemia Panel: No results for input(s): VITAMINB12, FOLATE, FERRITIN, TIBC, IRON, RETICCTPCT in the last 72 hours. Sepsis Labs: No results for input(s): PROCALCITON, LATICACIDVEN in the last 168 hours.  Recent Results (from the past 240 hour(s))  SARS Coronavirus 2 [facility] order, Performed in [place] hospital lab)     Status: None   Collection Time: 01/10/19 10:43 PM  Result Value Ref Range Status   SARS Coronavirus 2 NEGATIVE NEGATIVE Final    Comment: (NOTE) If result is NEGATIVE SARS-CoV-2 target nucleic acids are NOT DETECTED. The SARS-CoV-2 RNA is generally detectable in upper and lower  respiratory specimens during the acute phase of infection. The lowest  concentration of SARS-CoV-2 viral copies this assay can detect is 250  copies / mL. A negative result does not preclude SARS-CoV-2 infection  and should not be used as the sole basis for treatment or other  patient management decisions.  A negative result may occur with  improper specimen collection / handling, submission of specimen other  than nasopharyngeal swab, presence of viral mutation(s) within the  areas targeted by this assay, and inadequate number of viral copies  (<250 copies / mL). A negative result must be combined with clinical  observations, patient history, and epidemiological information. If result is POSITIVE SARS-CoV-2 target nucleic acids are DETECTED. The SARS-CoV-2 RNA is generally detectable in upper and lower  respiratory specimens dur ing the acute phase of infection.  Positive  results are indicative of active infection with SARS-CoV-2.  Clinical  correlation with patient history and other diagnostic information is  necessary to determine patient infection status.  Positive results do  not rule out bacterial infection or  co-infection with other viruses. If result is PRESUMPTIVE POSTIVE SARS-CoV-2 nucleic acids MAY BE PRESENT.   A presumptive positive result was obtained on the submitted specimen  and confirmed on repeat testing.  While 2019 novel coronavirus  (SARS-CoV-2) nucleic acids may be present in the submitted sample  additional confirmatory testing may be necessary for epidemiological  and / or clinical management purposes  to differentiate between  SARS-CoV-2 and other Sarbecovirus currently known to infect humans.  If clinically indicated additional testing with an alternate test  methodology [phone]) is advised. The SARS-CoV-2 RNA is generally  detectable in upper and lower respiratory sp ecimens during the acute  phase of infection. The expected result is Negative. Fact Sheet for Patients:  StrictlyIdeas.no Fact Sheet for Healthcare Providers: BankingDealers.co.za This test is not yet approved or cleared by the  [place] Architectural technologist and has been authorized for detection and/or diagnosis of SARS-CoV-2 by FDA under an Print production planner (EUA).  This EUA will remain in effect (meaning this test can be used) for the duration of the COVID-19 declaration under Section 564(b)(1) of the Act, 21 U.S.C. section 360bbb-3(b)(1), unless the authorization is terminated or revoked sooner. Performed at [facility], [place] [address] [place], [place] [address]       Radiology Studies: No results found.  Scheduled Meds:  allopurinol  200 mg Oral Daily   chlordiazePOXIDE  25 mg Oral BID   feeding supplement (ENSURE ENLIVE)  237 mL Oral BID BM   folic acid  1 mg Oral Daily   metoprolol tartrate  12.5 mg Oral BID   multivitamin with minerals  1 tablet Oral Daily   mupirocin ointment   Nasal BID   nicotine  21 mg Transdermal Daily   pantoprazole  40 mg Oral Daily   pneumococcal 23 valent vaccine  0.5 mL Intramuscular  Tomorrow-1000   sertraline  75 mg Oral Daily   thiamine  100 mg Oral Daily   Or   thiamine  100 mg Intravenous Daily   Continuous Infusions:  sodium chloride 100 mL/hr at 01/14/19 0942     LOS: 4 days   Assessment & Plan:   Principal Problem:   Fall Active Problems:   Hypokalemia   Hyponatremia   Abnormal LFTs   Alcohol abuse with intoxication ([place])   Tobacco dependence due to cigarettes   Schizoaffective disorder, depressive type (HCC)   Gout   GERD (gastroesophageal reflux disease)   HTN (hypertension)  Alcohol intoxication with acute withdrawal: Much better, CIWA also better than yesterday.  Received only 1 mg of Ativan last night.  Has received 6 doses of Librium 25 mg so far.  Will reduce frequency to twice daily for 3 more doses and continue CIWA score with PRN Ativan and multivitamins.    Hypertension: Pressure control.  Continue Lopressor and IV PRN hydralazine and add as needed clonidine.  Acute hyponatremia: Getting better.  Came in with 118.  Currently 134.   Hypokalemia: Resolved.  Fall resulting in scalp laceration: Most likely secondary to alcohol intoxication.  Has a staples in the head.  Slightly tender to palpation.  Alcoholic liver disease: Typical alcoholic liver disease pattern.  LFTs improving.  GERD: Continue PPI  Schizoaffective disorder/anxiety/depression: Continue home dose of sertraline.  Tobacco abuse: Smokes a pack of day since last several years.  Counseled for smoking cessation.  Continue nicotine patch.  DVT prophylaxis: SCD Code Status: Full code Family Communication: Discussed with patient.  No family involved. Disposition Plan: Pending clinical course.  Anticipate discharge on Saturday.  Will require home health and walker.  Case manager informed.  Time spent: 25 minutes   Darliss Cheney, MD [facility] Pager [phone]  If 7PM-7AM, please contact night-coverage [email or web address] Password [email or web address] 01/14/2019, 2:07 PM

## 2019-01-14 NOTE — Progress Notes (Signed)
Nutrition Follow-up  INTERVENTION:   -Continue Ensure Enlive po BID, each supplement provides 350 kcal and 20 grams of protein -Continue Multivitamin with minerals daily  NUTRITION DIAGNOSIS:   Inadequate oral intake related to poor appetite(ETOH abuse) as evidenced by per patient/family report.  Ongoing.  GOAL:   Patient will meet greater than or equal to 90% of their needs  Progressing.  MONITOR:   PO intake, Supplement acceptance, Labs, Weight trends, I & O's, Skin  ASSESSMENT:  48 y.o. male with medical history significant of schizoaffective disorder, PTSD, chronic alcohol abuse, hypertension, GERD, gout, depression with anxiety, tobacco abuse,TBI, who presents with fall.  **RD working remotely**  Patient consumed 100% of meals 5/28 and ate 50% of breakfast this morning. Pt is accepting Ensure supplements.  Weight was measured 5/27. Pt has lost 7 lbs since November 2019.  Medications: Folic acid tablet daily, Multivitamin with minerals daily, Thiamine tablet daily Labs reviewed: Low Na  Diet Order:   Diet Order            Diet Heart Room service appropriate? Yes; Fluid consistency: Thin; Fluid restriction: 1200 mL Fluid  Diet effective now              EDUCATION NEEDS:   No education needs have been identified at this time  Skin:  Skin Assessment: Skin Integrity Issues: Skin Integrity Issues:: Other (Comment) Other: scalp laceration   Last BM:  5/27  Height:   Ht Readings from Last 1 Encounters:  01/13/19 5\' 10"  (1.778 m)    Weight:   Wt Readings from Last 1 Encounters:  01/12/19 96.4 kg    Ideal Body Weight:  75.5 kg  BMI:  Body mass index is 30.49 kg/m.  Estimated Nutritional Needs:   Kcal:  2200-2400  Protein:  110-120g  Fluid:  2.2L/day  Clayton Bibles, MS, RD, LDN [facility] Pager: [phone] After Hours Pager: [phone]

## 2019-01-14 NOTE — Progress Notes (Signed)
Physical Therapy Treatment Patient Details Name: [name] MRN: [id number] DOB: 18-Feb-1971 Today's Date: 01/14/2019    History of Present Illness 48 yo male admitted for fall secondary to alcohol intoxication, sustained head trauma with multiple staples located on R side of head. Pt being monitored for alcohol withdrawal, encephalopathy. PMH includes seizures, PTSD, alcohol abuse, schizoaffective disorder, HTN, GERD, smoker, gout, depression/anxiety.    PT Comments    Pt with improved ambulation distance and did not require AD for 250 ft of ambulation this session. Pt with periods of unsteadiness and one LOB during challenges to gait, continuing to show deficits with higher level dynamic standing balance. Pt demonstrated proficiency in stair navigation, pt has a flight of steps to get to his second floor apt upon d/c. PT with concerns about pt living alone given pt's current unsteadiness, but pt states his brother and sister live closeby. Pt agrees that his brother/sister can be present for the first couple of days pt is home for safety. PT to continue to follow acutely.    Follow Up Recommendations  Home health PT;Supervision for mobility/OOB     Equipment Recommendations  Rolling walker with 5" wheels    Recommendations for Other Services       Precautions / Restrictions Precautions Precautions: Fall Precaution Comments: CIWA Restrictions Weight Bearing Restrictions: No    Mobility  Bed Mobility Overal bed mobility: Needs Assistance Bed Mobility: Supine to Sit     Supine to sit: Supervision     General bed mobility comments: Supervision for safety, increased time to come to sitting.   Transfers Overall transfer level: Needs assistance Equipment used: Rolling walker (2 wheeled) Transfers: Sit to/from Stand Sit to Stand: From elevated surface;Supervision         General transfer comment: Supervision for safety, verbal cuing for hand placement when rising x1.    Ambulation/Gait Ambulation/Gait assistance: Min assist;+2 safety/equipment;Supervision(chair follow) Gait Distance (Feet): 350 Feet Assistive device: Rolling walker (2 wheeled);None Gait Pattern/deviations: Step-through pattern;Decreased stride length;Trunk flexed;Narrow base of support;Drifts right/left Gait velocity: decr    General Gait Details: Supervision for safety, min assist for LOB to R with "stepping over obstacles" item on DGI. Pt used RW for first 100 ft ambulation, and walked without RW for remainder. Pt with no scissoring of gait with turns this session    Stairs Stairs: Yes Stairs assistance: Min guard;Supervision Stair Management: One rail Left;Step to pattern;Alternating pattern;Forwards Number of Stairs: 10(2x10 steps) General stair comments: Min guard initially, transitioned to supervision for safety. Pt with alternating stepping pattern with ascending and step-to pattern with descending stairs. LE fatigue noted with eccentric contraction of quadriceps femoris.    Wheelchair Mobility    Modified Rankin (Stroke Patients Only)       Balance Overall balance assessment: Needs assistance;History of Falls Sitting-balance support: No upper extremity supported;Feet supported Sitting balance-Leahy Scale: Good     Standing balance support: During functional activity;No upper extremity supported Standing balance-Leahy Scale: Fair Standing balance comment: reliant on RW and PT support                  Standardized Balance Assessment Standardized Balance Assessment : Dynamic Gait Index   Dynamic Gait Index Level Surface: Mild Impairment Change in Gait Speed: Mild Impairment Gait with Horizontal Head Turns: Mild Impairment Gait with Vertical Head Turns: Mild Impairment Gait and Pivot Turn: Mild Impairment Step Over Obstacle: Severe Impairment Step Around Obstacles: Mild Impairment Steps: Moderate Impairment Total Score: 13  Cognition  Arousal/Alertness: Awake/alert Behavior During Therapy: Flat affect Overall Cognitive Status: Impaired/Different from baseline Area of Impairment: Attention;Following commands;Safety/judgement;Problem solving                   Current Attention Level: Selective   Following Commands: Follows one step commands consistently;Follows multi-step commands with increased time Safety/Judgement: Decreased awareness of safety   Problem Solving: Slow processing;Requires verbal cues        Exercises      General Comments        Pertinent Vitals/Pain Pain Assessment: No/denies pain Pain Intervention(s): Monitored during session;Limited activity within patient's tolerance    Home Living                      Prior Function            PT Goals (current goals can now be found in the care plan section) Acute Rehab PT Goals Patient Stated Goal: get less shakey PT Goal Formulation: With patient Time For Goal Achievement: 01/26/19 Potential to Achieve Goals: Good Progress towards PT goals: Progressing toward goals    Frequency    Min 3X/week      PT Plan Current plan remains appropriate    Co-evaluation              AM-PAC PT "6 Clicks" Mobility   Outcome Measure  Help needed turning from your back to your side while in a flat bed without using bedrails?: None Help needed moving from lying on your back to sitting on the side of a flat bed without using bedrails?: None Help needed moving to and from a bed to a chair (including a wheelchair)?: A Little Help needed standing up from a chair using your arms (e.g., wheelchair or bedside chair)?: A Little Help needed to walk in hospital room?: A Little Help needed climbing 3-5 steps with a railing? : A Little 6 Click Score: 20    End of Session Equipment Utilized During Treatment: Gait belt Activity Tolerance: Patient tolerated treatment well Patient left: in chair;with call bell/phone within reach;with  chair alarm set Nurse Communication: Mobility status PT Visit Diagnosis: Other abnormalities of gait and mobility (R26.89);Difficulty in walking, not elsewhere classified (R26.2)     Time: 8366-2947 PT Time Calculation (min) (ACUTE ONLY): 17 min  Charges:  $Gait Training: 8-22 mins                     Julien Girt, PT Acute Rehabilitation Services Pager [phone]  Office [place] 01/14/2019, 11:52 AM

## 2019-01-15 DIAGNOSIS — F10239 Alcohol dependence with withdrawal, unspecified: Secondary | ICD-10-CM

## 2019-01-15 DIAGNOSIS — I10 Essential (primary) hypertension: Secondary | ICD-10-CM

## 2019-01-15 DIAGNOSIS — F10230 Alcohol dependence with withdrawal, uncomplicated: Secondary | ICD-10-CM

## 2019-01-15 DIAGNOSIS — F10939 Alcohol use, unspecified with withdrawal, unspecified: Secondary | ICD-10-CM

## 2019-01-15 LAB — CBC WITH DIFFERENTIAL/PLATELET
Abs Immature Granulocytes: 0.04 10*3/uL (ref 0.00–0.07)
Basophils Absolute: 0 10*3/uL (ref 0.0–0.1)
Basophils Relative: 0 %
Eosinophils Absolute: 0.1 10*3/uL (ref 0.0–0.5)
Eosinophils Relative: 2 %
HCT: 34.5 % — ABNORMAL LOW (ref 39.0–52.0)
Hemoglobin: 11.4 g/dL — ABNORMAL LOW (ref 13.0–17.0)
Immature Granulocytes: 1 %
Lymphocytes Relative: 32 %
Lymphs Abs: 1.5 10*3/uL (ref 0.7–4.0)
MCH: 33.4 pg (ref 26.0–34.0)
MCHC: 33 g/dL (ref 30.0–36.0)
MCV: 101.2 fL — ABNORMAL HIGH (ref 80.0–100.0)
Monocytes Absolute: 0.7 10*3/uL (ref 0.1–1.0)
Monocytes Relative: 14 %
Neutro Abs: 2.4 10*3/uL (ref 1.7–7.7)
Neutrophils Relative %: 51 %
Platelets: 200 10*3/uL (ref 150–400)
RBC: 3.41 MIL/uL — ABNORMAL LOW (ref 4.22–5.81)
RDW: 14.4 % (ref 11.5–15.5)
WBC: 4.6 10*3/uL (ref 4.0–10.5)
nRBC: 0 % (ref 0.0–0.2)

## 2019-01-15 LAB — COMPREHENSIVE METABOLIC PANEL
ALT: 329 U/L — ABNORMAL HIGH (ref 0–44)
AST: 260 U/L — ABNORMAL HIGH (ref 15–41)
Albumin: 3.1 g/dL — ABNORMAL LOW (ref 3.5–5.0)
Alkaline Phosphatase: 106 U/L (ref 38–126)
Anion gap: 8 (ref 5–15)
BUN: 12 mg/dL (ref 6–20)
CO2: 22 mmol/L (ref 22–32)
Calcium: 8.7 mg/dL — ABNORMAL LOW (ref 8.9–10.3)
Chloride: 104 mmol/L (ref 98–111)
Creatinine, Ser: 0.71 mg/dL (ref 0.61–1.24)
GFR calc Af Amer: >60 mL/min (ref >60)
GFR calc non Af Amer: >60 mL/min (ref >60)
Glucose, Bld: 89 mg/dL (ref 70–99)
Potassium: 3.9 mmol/L (ref 3.5–5.1)
Sodium: 134 mmol/L — ABNORMAL LOW (ref 135–145)
Total Bilirubin: 0.6 mg/dL (ref 0.3–1.2)
Total Protein: 5.8 g/dL — ABNORMAL LOW (ref 6.5–8.1)

## 2019-01-15 MED ORDER — AMLODIPINE BESYLATE 10 MG PO TABS: 10.0000 mg | ORAL_TABLET | Freq: Every day | ORAL | 0 refills | Status: DC

## 2019-01-15 NOTE — Progress Notes (Signed)
Patient discharged to home with friend, discharged instructions reviewed with patient who verbalized understanding.

## 2019-01-15 NOTE — TOC Transition Note (Signed)
Transition of Care [facility]) - CM/SW Discharge Note   Patient Details  Name: [name] MRN: [id number] Date of Birth: [birth year]  Transition of Care [facility]) CM/SW Contact:  Servando Snare, LCSW Phone Number: 01/15/2019, 12:00 PM   Clinical Narrative:   Home health arranged with [facility], DME order through [facility]     Final next level of care: [place] Barriers to Discharge: No Barriers Identified   Patient Goals and CMS Choice Patient states their goals for this hospitalization and ongoing recovery are:: Go home CMS Medicare.gov Compare Post Acute Care list provided to:: Patient Choice offered to / list presented to : Patient  Discharge Placement                Patient to be transferred to facility by: (Family/ Private car)      Discharge Plan and Services     Post Acute Care Choice: Durable Medical Equipment          DME Arranged: [place]ford Rile rolling DME Agency: [facility] Date DME Agency Contacted: 01/15/19 Time DME Agency Contacted: [phone] Representative spoke with at DME Agency: [place]: RN, PT [place] Agency: [facility] [place] Date [place]: 01/15/19 Time [place]: 1159 Representative spoke with at [place]: [place] ([place]) Interventions     Readmission Risk Interventions Readmission Risk Prevention Plan 01/15/2019  Medication Screening Complete  Transportation Screening Complete  Some recent data might be hidden

## 2019-01-15 NOTE — TOC Initial Note (Signed)
Transition of Care [facility]) - Initial/Assessment Note    Patient Details  Name: [name] MRN: [id number] Date of Birth: [birth year]  Transition of Care [facility]) CM/SW Contact:    Servando Snare, LCSW Phone Number: 01/15/2019, 11:59 AM  Clinical Narrative:      Patient is from home alone. Patient reports that his sister is available to assist him and provides transportation.              Expected Discharge Plan: [place] Barriers to Discharge: No Barriers Identified   Patient Goals and CMS Choice Patient states their goals for this hospitalization and ongoing recovery are:: Go home CMS Medicare.gov Compare Post Acute Care list provided to:: Patient Choice offered to / list presented to : Patient  Expected Discharge Plan and Services Expected Discharge Plan: [place] Choice: Durable Medical Equipment Living arrangements for the past 2 months: Apartment                 DME Arranged: Walker rolling DME Agency: [employer] Date DME Agency Contacted: 01/15/19 Time DME Agency Contacted: [id number] Representative spoke with at DME Agency: [place] Arranged: RN, PT [place] Agency: [facility] [place] Date [place]: 01/15/19 Time [place]: 1159 Representative spoke with at [place]: Dorian Pod  Prior Living Arrangements/Services Living arrangements for the past 2 months: [place] with:: Self Patient language and need for interpreter reviewed:: No Do you feel safe going back to the place where you live?: Yes      Need for Family Participation in Patient Care: Yes (Comment) Care giver support system in place?: Yes (comment) Current home services: DME Criminal Activity/Legal Involvement Pertinent to Current Situation/Hospitalization: No - Comment as needed  Activities of Daily Living Home Assistive Devices/Equipment: None ADL Screening (condition at time of admission) Patient's cognitive ability adequate to  safely complete daily activities?: Yes Is the patient deaf or have difficulty hearing?: No Does the patient have difficulty seeing, even when wearing glasses/contacts?: No Does the patient have difficulty concentrating, remembering, or making decisions?: No Patient able to express need for assistance with ADLs?: Yes Does the patient have difficulty dressing or bathing?: No Independently performs ADLs?: Yes (appropriate for developmental age) Does the patient have difficulty walking or climbing stairs?: No Weakness of Legs: None Weakness of Arms/Hands: None  Permission Sought/Granted   Permission granted to share information with : No              Emotional Assessment Appearance:: Appears stated age Attitude/Demeanor/Rapport: Engaged Affect (typically observed): Accepting, Calm Orientation: : Oriented to Self, Oriented to Place, Oriented to  Time, Oriented to Situation Alcohol / Substance Use: Illicit Drugs, Alcohol Use Psych Involvement: Outpatient Provider  Admission diagnosis:  Hypokalemia [E87.6] Hyponatremia [E87.1] Transaminitis [R74.0] Fall, initial encounter [W19.XXXA] Laceration of scalp, initial encounter [W41.32GM] Alcoholic intoxication with complication [facility]) [W10.272] Patient Active Problem List   Diagnosis Date Noted  . HTN (hypertension) 01/10/2019  . Fall 01/10/2019  . Alcohol intoxication ([place]) 01/10/2019  . Scalp laceration   . Schizoaffective disorder, bipolar type ([place]) 06/02/2018  . Low back pain 08/21/2016  . Alcohol dependence with uncomplicated withdrawal ([place]) 02/22/2016  . Alcohol-induced mood disorder ([place]) 02/22/2016  . Gout 10/22/2015  . GERD (gastroesophageal reflux disease) 10/22/2015  . Alcohol withdrawal seizure with complication ([place]) 53/66/4403  . Schizoaffective disorder, depressive type ([place])   . History of alcohol abuse   . Tobacco dependence due to  cigarettes 11/20/2014  . Alcohol abuse with intoxication ([place])   . Hyponatremia  10/18/2014  . Abnormal LFTs   . Post traumatic stress disorder (PTSD) 11/03/2011  . Hypokalemia 07/02/2011   PCP:  Scot Jun, FNP Pharmacy:   [facility], [address] [place] [address] Phone: [phone] Fax: [phone]     Social Determinants of Health (SDOH) Interventions    Readmission Risk Interventions Readmission Risk Prevention Plan 01/15/2019  Medication Screening Complete  Transportation Screening Complete  Some recent data might be hidden

## 2019-01-15 NOTE — Discharge Instructions (Signed)
Alcohol Withdrawal Syndrome When a person who drinks a lot of alcohol stops drinking, he or she may have unpleasant and serious symptoms. These symptoms are called alcohol withdrawal syndrome. This condition may be mild or severe. It can be life-threatening. It can cause:  Shaking that you cannot control (tremor).  Sweating.  Headache.  Feeling fearful, upset, grouchy, or depressed.  Trouble sleeping (insomnia).  Nightmares.  Fast or uneven heartbeats (palpitations).  Alcohol cravings.  Feeling sick to your stomach (nausea).  Throwing up (vomiting).  Being bothered by light and sounds.  Confusion.  Trouble thinking clearly.  Not being hungry (loss of appetite).  Big changes in mood (mood swings). If you have all of the following symptoms at the same time, get help right away:  High blood pressure.  Fast heartbeat.  Trouble breathing.  Seizures.  Seeing, hearing, feeling, smelling, or tasting things that are not there (hallucinations). These symptoms are known as delirium tremens (DTs). They must be treated at the hospital right away. Follow these instructions at home:   Take over-the-counter and prescription medicines only as told by your doctor. This includes vitamins.  Do not drink alcohol.  Do not drive until your doctor says that this is safe for you.  Have someone stay with you or be available in case you need help. This should be someone you trust. This person can help you with your symptoms. He or she can also help you to not drink.  Drink enough fluid to keep your pee (urine) pale yellow.  Think about joining a support group or a treatment program to help you stop drinking.  Keep all follow-up visits as told by your doctor. This is important. Contact a doctor if:  Your symptoms get worse.  You cannot eat or drink without throwing up.  You have a hard time not drinking alcohol.  You cannot stop drinking alcohol. Get help right away  if:  You have fast or uneven heartbeats.  You have chest pain.  You have trouble breathing.  You have a seizure for the first time.  You see, hear, feel, smell, or taste something that is not there.  You get very confused. Summary  When a person who drinks a lot of alcohol stops drinking, he or she may have serious symptoms. This is called alcohol withdrawal syndrome.  Delirium tremens (DTs) is a group of life-threatening symptoms. You should get help right away if you have these symptoms.  Think about joining an alcohol support group or a treatment program. This information is not intended to replace advice given to you by your health care provider. Make sure you discuss any questions you have with your health care provider. Document Released: 01/21/2008 Document Revised: 04/10/2017 Document Reviewed: 04/10/2017 Elsevier Interactive Patient Education  2019 [employer].

## 2019-01-15 NOTE — Discharge Summary (Signed)
Physician Discharge Summary  [name] HER:[id number] DOB: [birth year] DOA: 01/10/2019  PCP: Scot Jun, FNP  Admit date: 01/10/2019 Discharge date: 01/15/2019  Admitted From: Home Disposition: Home  Recommendations for Outpatient Follow-up:  1. Follow up with PCP in 1-2 weeks 2. Please obtain BMP/CBC in one week 3. Please follow up on the following pending results:  Home Health: Yes Equipment/Devices: Rolling walker  Discharge Condition: Stable CODE STATUS: Full code Diet recommendation: Heart healthy  Subjective: Patient seen and examined.  He feels much better.  No more headache or any other complaint.  Very minimal to none tremors.  Brief/Interim Summary: Patient is a 48 year old male with history of seizures affective disorder, PTSD, chronic alcohol abuse, hypertension, GERD, smoker, gout, depression with anxiety who was brought to the emergency department after he was found to have fallen on the street. On presentation, patient was found to have severe hyponatremia, Severe hypokalemia. He had laceration of scalp on the right side which was a stable in the emergency department. He was acutely intoxicated with alcohol with elevated blood alcohol level.  He was started on IV fluids and potassium was replaced.  His sodium improved.  He started to have acute alcohol withdrawal syndrome for which she was started on Librium with as needed Ativan, CIWA protocol, thiamine, multivitamin and folic acid.  Over the course of last 24 to 48 hours, he has improved significantly and has not required any as needed Ativan.  He was evaluated by PT OT and they recommended home health with a rolling walker which has been arranged for him.  His blood pressure has remained elevated requiring PRN medications so I am going to discharge him on amlodipine 10 mg p.o. daily.  He will follow with PCP for the removal of his staples.  Discharge Diagnoses:  Principal Problem:   Fall Active Problems:    Hypokalemia   Hyponatremia   Abnormal LFTs   Alcohol abuse with intoxication ([place])   Tobacco dependence due to cigarettes   Schizoaffective disorder, depressive type ([place])   Gout   GERD (gastroesophageal reflux disease)   HTN (hypertension)   Alcohol withdrawal ([place])    Discharge Instructions  Discharge Instructions    Discharge patient   Complete by:  As directed    Discharge disposition:  06-Home-Health Care Svc   Discharge patient date:  01/15/2019     Allergies as of 01/15/2019      Reactions   Wellbutrin [bupropion] Other (See Comments)   Reaction:  Seizure       Medication List    TAKE these medications   albuterol 108 (90 Base) MCG/ACT inhaler Commonly known as:  VENTOLIN HFA Inhale 2 puffs into the lungs every 6 (six) hours as needed for wheezing or shortness of breath.   allopurinol 100 MG tablet Commonly known as:  ZYLOPRIM Take 2 tablets (200 mg total) by mouth daily.   amLODipine 10 MG tablet Commonly known as:  NORVASC Take 1 tablet (10 mg total) by mouth daily for 30 days.   metoprolol tartrate 25 MG tablet Commonly known as:  LOPRESSOR Take 0.5 tablets (12.5 mg total) by mouth 2 (two) times daily.   paliperidone 156 MG/ML Susy injection Commonly known as:  INVEGA SUSTENNA Inject 1 mL (156 mg total) into the muscle every 30 (thirty) days. Last administration 06/07/18. Next administration due 07/07/18.   pantoprazole 40 MG tablet Commonly known as:  PROTONIX Take 1 tablet (40 mg total) by mouth daily.   sertraline 50 MG  tablet Commonly known as:  ZOLOFT Take 75 mg by mouth daily.            Durable Medical Equipment  (From admission, onward)         Start     Ordered   01/15/19 1147  For home use only DME Walker rolling  Once    Question:  Patient needs a walker to treat with the following condition  Answer:  Gait abnormality   01/15/19 1146         Follow-up Information    Scot Jun, FNP Follow up in 1 week(s).    Specialty:  Family Medicine Contact information: [address] [place]  [address] [phone]          Allergies  Allergen Reactions  . Wellbutrin [Bupropion] Other (See Comments)    Reaction:  Seizure     Consultations:    Procedures/Studies: Ct Head Wo Contrast  Result Date: 01/10/2019 CLINICAL DATA:  Pain status post fall. Lacerations to temporal region. EXAM: CT HEAD WITHOUT CONTRAST CT CERVICAL SPINE WITHOUT CONTRAST TECHNIQUE: Multidetector CT imaging of the head and cervical spine was performed following the standard protocol without intravenous contrast. Multiplanar CT image reconstructions of the cervical spine were also generated. COMPARISON:  04/23/2015 FINDINGS: CT HEAD FINDINGS Brain: No evidence of acute infarction, hemorrhage, hydrocephalus, extra-axial collection or mass lesion/mass effect. Vascular: No hyperdense vessel or unexpected calcification. Skull: Normal. Negative for fracture or focal lesion. There is right frontal and temporal scalp swelling without evidence of an underlying fracture. There is some subcutaneous gas likely related to a laceration in this region. Sinuses/Orbits: No acute finding. Mucosal thickening of the right maxillary sinus is noted. Other: None. CT CERVICAL SPINE FINDINGS Alignment: There is straightening of the normal cervical lordotic curvature which is nonspecific. Skull base and vertebrae: No acute fracture. No primary bone lesion or focal pathologic process. Soft tissues and spinal canal: No prevertebral fluid or swelling. No visible canal hematoma. Disc levels:  The disc heights are relatively well maintained. Upper chest: Negative. Other: None IMPRESSION: 1. No acute intracranial abnormality detected. 2. No acute cervical spine fracture. 3. Right temporal scalp swelling and lacerations without evidence of underlying calvarial defect. Electronically Signed   By: Constance Holster M.D.   On: 01/10/2019 21:31   Ct Cervical  Spine Wo Contrast  Result Date: 01/10/2019 CLINICAL DATA:  Pain status post fall. Lacerations to temporal region. EXAM: CT HEAD WITHOUT CONTRAST CT CERVICAL SPINE WITHOUT CONTRAST TECHNIQUE: Multidetector CT imaging of the head and cervical spine was performed following the standard protocol without intravenous contrast. Multiplanar CT image reconstructions of the cervical spine were also generated. COMPARISON:  04/23/2015 FINDINGS: CT HEAD FINDINGS Brain: No evidence of acute infarction, hemorrhage, hydrocephalus, extra-axial collection or mass lesion/mass effect. Vascular: No hyperdense vessel or unexpected calcification. Skull: Normal. Negative for fracture or focal lesion. There is right frontal and temporal scalp swelling without evidence of an underlying fracture. There is some subcutaneous gas likely related to a laceration in this region. Sinuses/Orbits: No acute finding. Mucosal thickening of the right maxillary sinus is noted. Other: None. CT CERVICAL SPINE FINDINGS Alignment: There is straightening of the normal cervical lordotic curvature which is nonspecific. Skull base and vertebrae: No acute fracture. No primary bone lesion or focal pathologic process. Soft tissues and spinal canal: No prevertebral fluid or swelling. No visible canal hematoma. Disc levels:  The disc heights are relatively well maintained. Upper chest: Negative. Other: None IMPRESSION: 1. No  acute intracranial abnormality detected. 2. No acute cervical spine fracture. 3. Right temporal scalp swelling and lacerations without evidence of underlying calvarial defect. Electronically Signed   By: Constance Holster M.D.   On: 01/10/2019 21:31      Discharge Exam: Vitals:   01/14/19 2346 01/15/19 0607  BP: (!) 148/96 (!) 149/98  Pulse: (!) 57 (!) 59  Resp: 16 16  Temp: 98.3 F (36.8 C) 98.1 F (36.7 C)  SpO2: 95% 96%   Vitals:   01/14/19 1218 01/14/19 1752 01/14/19 2346 01/15/19 0607  BP: (!) 123/92 134/86 (!) 148/96 (!)  149/98  Pulse: 75 72 (!) 57 (!) 59  Resp: 20 (!) 21 16 16   Temp: 98.9 F (37.2 C) 98.2 F (36.8 C) 98.3 F (36.8 C) 98.1 F (36.7 C)  TempSrc: Oral Oral Oral Oral  SpO2: 97% 95% 95% 96%  Weight:      Height:        General: Pt is alert, awake, not in acute distress Cardiovascular: RRR, S1/S2 +, no rubs, no gallops Respiratory: CTA bilaterally, no wheezing, no rhonchi Abdominal: Soft, NT, ND, bowel sounds + Extremities: no edema, no cyanosis    The results of significant diagnostics from this hospitalization (including imaging, microbiology, ancillary and laboratory) are listed below for reference.     Microbiology: Recent Results (from the past 240 hour(s))  SARS Coronavirus 2 [facility] order, Performed in [facility] hospital lab)     Status: None   Collection Time: 01/10/19 10:43 PM  Result Value Ref Range Status   SARS Coronavirus 2 NEGATIVE NEGATIVE Final    Comment: (NOTE) If result is NEGATIVE SARS-CoV-2 target nucleic acids are NOT DETECTED. The SARS-CoV-2 RNA is generally detectable in upper and lower  respiratory specimens during the acute phase of infection. The lowest  concentration of SARS-CoV-2 viral copies this assay can detect is 250  copies / mL. A negative result does not preclude SARS-CoV-2 infection  and should not be used as the sole basis for treatment or other  patient management decisions.  A negative result may occur with  improper specimen collection / handling, submission of specimen other  than nasopharyngeal swab, presence of viral mutation(s) within the  areas targeted by this assay, and inadequate number of viral copies  (<250 copies / mL). A negative result must be combined with clinical  observations, patient history, and epidemiological information. If result is POSITIVE SARS-CoV-2 target nucleic acids are DETECTED. The SARS-CoV-2 RNA is generally detectable in upper and lower  respiratory specimens dur ing the acute phase of  infection.  Positive  results are indicative of active infection with SARS-CoV-2.  Clinical  correlation with patient history and other diagnostic information is  necessary to determine patient infection status.  Positive results do  not rule out bacterial infection or co-infection with other viruses. If result is PRESUMPTIVE POSTIVE SARS-CoV-2 nucleic acids MAY BE PRESENT.   A presumptive positive result was obtained on the submitted specimen  and confirmed on repeat testing.  While 2019 novel coronavirus  (SARS-CoV-2) nucleic acids may be present in the submitted sample  additional confirmatory testing may be necessary for epidemiological  and / or clinical management purposes  to differentiate between  SARS-CoV-2 and other Sarbecovirus currently known to infect humans.  If clinically indicated additional testing with an alternate test  methodology [phone]) is advised. The SARS-CoV-2 RNA is generally  detectable in upper and lower respiratory sp ecimens during the acute  phase of infection. The expected  result is Negative. Fact Sheet for Patients:  StrictlyIdeas.no Fact Sheet for Healthcare Providers: BankingDealers.co.za This test is not yet approved or cleared by the [place] FDA and has been authorized for detection and/or diagnosis of SARS-CoV-2 by FDA under an Emergency Use Authorization (EUA).  This EUA will remain in effect (meaning this test can be used) for the duration of the COVID-19 declaration under Section 564(b)(1) of the Act, 21 U.S.C. section 360bbb-3(b)(1), unless the authorization is terminated or revoked sooner. Performed at [facility], [place] [address], [place], [place] [address]      Labs: BNP (last 3 results) No results for input(s): BNP in the last 8760 hours. Basic Metabolic Panel: Recent Labs  Lab 01/10/19 2236 01/10/19 2300 01/11/19 0539 01/12/19 0612 01/13/19 1114  01/14/19 0606 01/15/19 1002  NA 120*  --  124* 131* 131* 134* 134*  K 2.2*  --  3.4* 3.8 4.1 4.1 3.9  CL 76*  --  82* 96* 102 105 104  CO2 24  --  28 28 22 22 22   GLUCOSE 113*  --  100* 89 99 90 89  BUN <5*  --  5* 12 11 13 12   CREATININE 0.52*  --  0.71 0.82 0.73 0.71 0.71  CALCIUM 8.0*  --  8.8* 8.5* 8.2* 8.8* 8.7*  MG 1.5*  --   --   --  1.9  --   --   PHOS  --  3.1  --   --   --   --   --    Liver Function Tests: Recent Labs  Lab 01/10/19 2023 01/12/19 0612 01/13/19 1114 01/14/19 0606 01/15/19 1002  AST 619* 585* 341* 298* 260*  ALT 369* 418* 320* 325* 329*  ALKPHOS 99 107 108 113 106  BILITOT 1.1 1.5* 0.9 1.1 0.6  PROT 6.7 5.9* 5.7* 6.2* 5.8*  ALBUMIN 3.9 3.3* 3.0* 3.4* 3.1*   No results for input(s): LIPASE, AMYLASE in the last 168 hours. No results for input(s): AMMONIA in the last 168 hours. CBC: Recent Labs  Lab 01/10/19 2023 01/11/19 0539 01/13/19 1114 01/14/19 0606 01/15/19 0628  WBC 4.0 3.8* 4.7 4.6 4.6  NEUTROABS 2.4  --  2.7 2.2 2.4  HGB 13.9 13.3 11.5* 11.9* 11.4*  HCT 38.0* 36.8* 34.1* 35.5* 34.5*  MCV 91.1 92.5 99.7 99.7 101.2*  PLT 149* 139* 163 192 200   Cardiac Enzymes: No results for input(s): CKTOTAL, CKMB, CKMBINDEX, TROPONINI in the last 168 hours. BNP: Invalid input(s): POCBNP CBG: Recent Labs  Lab 01/11/19 0720 01/12/19 0747 01/13/19 0729 01/14/19 0740  GLUCAP 97 91 86 90   D-Dimer No results for input(s): DDIMER in the last 72 hours. Hgb A1c No results for input(s): HGBA1C in the last 72 hours. Lipid Profile No results for input(s): CHOL, HDL, LDLCALC, TRIG, CHOLHDL, LDLDIRECT in the last 72 hours. Thyroid function studies No results for input(s): TSH, T4TOTAL, T3FREE, THYROIDAB in the last 72 hours.  Invalid input(s): FREET3 Anemia work up No results for input(s): VITAMINB12, FOLATE, FERRITIN, TIBC, IRON, RETICCTPCT in the last 72 hours. Urinalysis    Component Value Date/Time   COLORURINE AMBER (A) 04/06/2016 1026    APPEARANCEUR CLEAR 04/06/2016 1026   LABSPEC 1.025 04/06/2016 1026   PHURINE 6.0 04/06/2016 1026   GLUCOSEU NEGATIVE 04/06/2016 1026   HGBUR NEGATIVE 04/06/2016 1026   BILIRUBINUR SMALL (A) 04/06/2016 1026   KETONESUR 40 (A) 04/06/2016 1026   PROTEINUR 100 (A) 04/06/2016 1026   UROBILINOGEN 1.0  12/24/2014 0643   NITRITE NEGATIVE 04/06/2016 1026   LEUKOCYTESUR SMALL (A) 04/06/2016 1026   Sepsis Labs Invalid input(s): PROCALCITONIN,  WBC,  LACTICIDVEN Microbiology Recent Results (from the past 240 hour(s))  SARS Coronavirus 2 [facility] order, Performed in [place] hospital lab)     Status: None   Collection Time: 01/10/19 10:43 PM  Result Value Ref Range Status   SARS Coronavirus 2 NEGATIVE NEGATIVE Final    Comment: (NOTE) If result is NEGATIVE SARS-CoV-2 target nucleic acids are NOT DETECTED. The SARS-CoV-2 RNA is generally detectable in upper and lower  respiratory specimens during the acute phase of infection. The lowest  concentration of SARS-CoV-2 viral copies this assay can detect is 250  copies / mL. A negative result does not preclude SARS-CoV-2 infection  and should not be used as the sole basis for treatment or other  patient management decisions.  A negative result may occur with  improper specimen collection / handling, submission of specimen other  than nasopharyngeal swab, presence of viral mutation(s) within the  areas targeted by this assay, and inadequate number of viral copies  (<250 copies / mL). A negative result must be combined with clinical  observations, patient history, and epidemiological information. If result is POSITIVE SARS-CoV-2 target nucleic acids are DETECTED. The SARS-CoV-2 RNA is generally detectable in upper and lower  respiratory specimens dur ing the acute phase of infection.  Positive  results are indicative of active infection with SARS-CoV-2.  Clinical  correlation with patient history and other diagnostic information is   necessary to determine patient infection status.  Positive results do  not rule out bacterial infection or co-infection with other viruses. If result is PRESUMPTIVE POSTIVE SARS-CoV-2 nucleic acids MAY BE PRESENT.   A presumptive positive result was obtained on the submitted specimen  and confirmed on repeat testing.  While 2019 novel coronavirus  (SARS-CoV-2) nucleic acids may be present in the submitted sample  additional confirmatory testing may be necessary for epidemiological  and / or clinical management purposes  to differentiate between  SARS-CoV-2 and other Sarbecovirus currently known to infect humans.  If clinically indicated additional testing with an alternate test  methodology [phone]) is advised. The SARS-CoV-2 RNA is generally  detectable in upper and lower respiratory sp ecimens during the acute  phase of infection. The expected result is Negative. Fact Sheet for Patients:  StrictlyIdeas.no Fact Sheet for Healthcare Providers: BankingDealers.co.za This test is not yet approved or cleared by the [place] FDA and has been authorized for detection and/or diagnosis of SARS-CoV-2 by FDA under an Emergency Use Authorization (EUA).  This EUA will remain in effect (meaning this test can be used) for the duration of the COVID-19 declaration under Section 564(b)(1) of the Act, 21 U.S.C. section 360bbb-3(b)(1), unless the authorization is terminated or revoked sooner. Performed at [facility], [place] [address], [place], [place] [address]      Time coordinating discharge:  30 minutes  SIGNED:   Darliss Cheney, MD  [facility] 01/15/2019, 12:17 PM Pager [phone]  If 7PM-7AM, please contact night-coverage [email or web address] Password [email or web address]

## 2019-01-24 ENCOUNTER — Telehealth: Payer: Self-pay

## 2019-01-24 NOTE — Telephone Encounter (Signed)
Called patient to do their pre-visit COVID screening.  Call went to voicemail. Unable to prescreen.  

## 2019-01-25 ENCOUNTER — Encounter: Payer: Self-pay | Admitting: Family Medicine

## 2019-01-25 ENCOUNTER — Ambulatory Visit (INDEPENDENT_AMBULATORY_CARE_PROVIDER_SITE_OTHER): Payer: Medicare Other | Admitting: Family Medicine

## 2019-01-25 ENCOUNTER — Other Ambulatory Visit: Payer: Medicare Other

## 2019-01-25 ENCOUNTER — Other Ambulatory Visit: Payer: Self-pay

## 2019-01-25 VITALS — BP 130/87 | HR 78 | Temp 98.4°F | Resp 17 | Ht 72.0 in | Wt 220.4 lb

## 2019-01-25 DIAGNOSIS — Z4802 Encounter for removal of sutures: Secondary | ICD-10-CM | POA: Diagnosis not present

## 2019-01-25 DIAGNOSIS — S0101XD Laceration without foreign body of scalp, subsequent encounter: Secondary | ICD-10-CM | POA: Diagnosis not present

## 2019-01-25 DIAGNOSIS — F1010 Alcohol abuse, uncomplicated: Secondary | ICD-10-CM

## 2019-01-25 DIAGNOSIS — M1A49X0 Other secondary chronic gout, multiple sites, without tophus (tophi): Secondary | ICD-10-CM

## 2019-01-25 DIAGNOSIS — R58 Hemorrhage, not elsewhere classified: Secondary | ICD-10-CM

## 2019-01-25 NOTE — Progress Notes (Signed)
Patient here for labs for tomorrow's televisit. KWalker, CMA.

## 2019-01-25 NOTE — Progress Notes (Signed)
Patient ID: [name], male    DOB: Jun 15, 1971, 48 y.o.   MRN: [id number]  PCP: Scot Jun, FNP  Chief Complaint  Patient presents with  . Suture / Staple Removal    Subjective:  HPI [name] is a 48 y.o. male presents for evaluation staple removal following recent scalp laceration sustained after a recent fall.  [name] has Hypokalemia; Post traumatic stress disorder (PTSD); Hyponatremia; Abnormal LFTs; Alcohol abuse with intoxication ([place]); Tobacco dependence due to cigarettes; Schizoaffective disorder, depressive type ([place]); History of alcohol abuse; Alcohol withdrawal seizure with complication ([place]); Gout; GERD (gastroesophageal reflux disease); Alcohol dependence with uncomplicated withdrawal ([place]); Alcohol-induced mood disorder ([place]); Low back pain; Schizoaffective disorder, bipolar type ([place]); HTN (hypertension); Fall; Alcohol intoxication ([place]); Scalp laceration; and Alcohol withdrawal ([facility]) on their problem list.   Staple Removal  Patient is here for a procedure only today. He has a total of seven staples present in scalp after sustaining a fall while intoxicated resulting in a laceration to the scalp.  He denies drainage from the site, warmth at the site, fever, chills, nausea or vomiting.  Staples were placed on 01/10/2019.  Patient reports today was the soonest available appointment he was able to obtain. Social History   Socioeconomic History  . Marital status: Divorced    Spouse name: Not on file  . Number of children: Not on file  . Years of education: Not on file  . Highest education level: Not on file  Occupational History  . Not on file  Social Needs  . Financial resource strain: Not on file  . Food insecurity:    Worry: Not on file    Inability: Not on file  . Transportation needs:    Medical: Not on file    Non-medical: Not on file  Tobacco Use  . Smoking status: Current Every Day Smoker    Packs/day: 1.00    Years: 24.00    Pack  years: 24.00    Types: Cigarettes  . Smokeless tobacco: Never Used  Substance and Sexual Activity  . Alcohol use: Yes    Alcohol/week: 0.0 standard drinks    Frequency: Never    Comment: drinking daily   . Drug use: Yes    Frequency: 1.0 times per week    Types: Marijuana    Comment: THC 2 to 3 times per month. last use 09/24/2015  . Sexual activity: Yes    Birth control/protection: Condom  Lifestyle  . Physical activity:    Days per week: Not on file    Minutes per session: Not on file  . Stress: Not on file  Relationships  . Social connections:    Talks on phone: Not on file    Gets together: Not on file    Attends religious service: Not on file    Active member of club or organization: Not on file    Attends meetings of clubs or organizations: Not on file    Relationship status: Not on file  . Intimate partner violence:    Fear of current or ex partner: Not on file    Emotionally abused: Not on file    Physically abused: Not on file    Forced sexual activity: Not on file  Other Topics Concern  . Not on file  Social History Narrative  . Not on file    Family History  Problem Relation Age of Onset  . Alcoholism Mother   . CAD Father   . Depression Brother  Review of Systems Pertinent negatives listed in HPI Allergies  Allergen Reactions  . Wellbutrin [Bupropion] Other (See Comments)    Reaction:  Seizure     Prior to Admission medications   Medication Sig Start Date End Date Taking? Authorizing Provider  allopurinol (ZYLOPRIM) 100 MG tablet Take 2 tablets (200 mg total) by mouth daily. 07/02/18  Yes Scot Jun, FNP  amLODipine (NORVASC) 10 MG tablet Take 1 tablet (10 mg total) by mouth daily for 30 days. 01/15/19 02/14/19 Yes Pahwani, Einar Grad, MD  metoprolol tartrate (LOPRESSOR) 25 MG tablet Take 0.5 tablets (12.5 mg total) by mouth 2 (two) times daily. 06/30/18  Yes Scot Jun, FNP  pantoprazole (PROTONIX) 40 MG tablet Take 1 tablet (40 mg total)  by mouth daily. 06/30/18  Yes Scot Jun, FNP  sertraline (ZOLOFT) 50 MG tablet Take 75 mg by mouth daily.  12/14/18  Yes [provider]    Past Medical, Surgical Family and Social History reviewed and updated.    Objective:   Today's Vitals   01/25/19 1341  BP: 130/87  Pulse: 78  Resp: 17  Temp: 98.4 F (36.9 C)  TempSrc: Temporal  SpO2: 95%  Weight: 220 lb 6.4 oz (100 kg)  Height: 6' (1.829 m)    BP Readings from Last 3 Encounters:  01/25/19 130/87  01/15/19 138/90  10/05/18 (!) 137/95    Filed Weights   01/25/19 1341  Weight: 220 lb 6.4 oz (100 kg)     Physical Exam  General appearance: alert, well developed, well nourished, cooperative and in no distress Head: Normocephalic, without obvious abnormality, atraumatic Respiratory: Respirations even and unlabored, normal respiratory rate Heart: rate and rhythm normal. No gallop or murmurs noted on exam  Abdomen: BS +, no distention, no rebound tenderness, or no mass Extremities: No gross deformities Skin: Skin color, texture, turgor normal. No rashes seen  Psych: Appropriate mood and affect. Neurologic: Mental status: Alert, oriented to person, place, and time, thought content appropriate. Lab Results  Component Value Date   HGBA1C 5.3 06/03/2018    Assessment & Plan:  1. Encounter for staple removal 2. Laceration of scalp, subsequent encounter Removed 7 staples without scalp. Patient tolerated procedure. No blood loss. Discussed warning signs of infection. Patient verbalized understanding.  Molli Barrows, FNP Primary Care at [facility] [address] [place] [phone]: [phone]

## 2019-01-25 NOTE — Progress Notes (Unsigned)
Patient ID: [name], male    DOB: 05-29-1971, 48 y.o.   MRN: [id number]  PCP: Scot Jun, FNP  No chief complaint on file.   Subjective:  HPI  [name] is a 48 y.o. male presents for evaluation   has Hypokalemia; Post traumatic stress disorder (PTSD); Hyponatremia; Abnormal LFTs; Alcohol abuse with intoxication ([place]); Tobacco dependence due to cigarettes; Schizoaffective disorder, depressive type ([name]); History of alcohol abuse; Alcohol withdrawal seizure with complication ([name]); Gout; GERD (gastroesophageal reflux disease); Alcohol dependence with uncomplicated withdrawal ([name]); Alcohol-induced mood disorder ([name]); Low back pain; Schizoaffective disorder, bipolar type ([name]); HTN (hypertension); Fall; Alcohol intoxication ([name]); Scalp laceration; and Alcohol withdrawal ([name]) on their problem list.   Today's visit:  Social History   Socioeconomic History  . Marital status: Divorced    Spouse name: Not on file  . Number of children: Not on file  . Years of education: Not on file  . Highest education level: Not on file  Occupational History  . Not on file  Social Needs  . Financial resource strain: Not on file  . Food insecurity:    Worry: Not on file    Inability: Not on file  . Transportation needs:    Medical: Not on file    Non-medical: Not on file  Tobacco Use  . Smoking status: Current Every Day Smoker    Packs/day: 1.00    Years: 24.00    Pack years: 24.00    Types: Cigarettes  . Smokeless tobacco: Never Used  Substance and Sexual Activity  . Alcohol use: Yes    Alcohol/week: 0.0 standard drinks    Frequency: Never    Comment: drinking daily   . Drug use: Yes    Frequency: 1.0 times per week    Types: Marijuana    Comment: THC 2 to 3 times per month. last use 09/24/2015  . Sexual activity: Yes    Birth control/protection: Condom  Lifestyle  . Physical activity:    Days per week: Not on file    Minutes per session: Not on file  . Stress:  Not on file  Relationships  . Social connections:    Talks on phone: Not on file    Gets together: Not on file    Attends religious service: Not on file    Active member of club or organization: Not on file    Attends meetings of clubs or organizations: Not on file    Relationship status: Not on file  . Intimate partner violence:    Fear of current or ex partner: Not on file    Emotionally abused: Not on file    Physically abused: Not on file    Forced sexual activity: Not on file  Other Topics Concern  . Not on file  Social History Narrative  . Not on file    Family History  Problem Relation Age of Onset  . Alcoholism Mother   . CAD Father   . Depression Brother      Review of Systems  Allergies  Allergen Reactions  . Wellbutrin [Bupropion] Other (See Comments)    Reaction:  Seizure     Prior to Admission medications   Medication Sig Start Date End Date Taking? Authorizing Provider  allopurinol (ZYLOPRIM) 100 MG tablet Take 2 tablets (200 mg total) by mouth daily. 07/02/18   Scot Jun, FNP  amLODipine (NORVASC) 10 MG tablet Take 1 tablet (10 mg total) by mouth daily for 30 days. 01/15/19 02/14/19  Darliss Cheney, MD  metoprolol tartrate (LOPRESSOR) 25 MG tablet Take 0.5 tablets (12.5 mg total) by mouth 2 (two) times daily. 06/30/18   Scot Jun, FNP  pantoprazole (PROTONIX) 40 MG tablet Take 1 tablet (40 mg total) by mouth daily. 06/30/18   Scot Jun, FNP  sertraline (ZOLOFT) 50 MG tablet Take 75 mg by mouth daily.  12/14/18   [provider]    Past Medical, Surgical Family and Social History reviewed and updated.    Objective:  There were no vitals filed for this visit.  BP Readings from Last 3 Encounters:  01/25/19 130/87  01/15/19 138/90  10/05/18 (!) 137/95    There were no vitals filed for this visit.     Physical Exam General appearance: alert, well developed, well nourished, cooperative and in no distress Head:  Normocephalic, without obvious abnormality, atraumatic Respiratory: Respirations even and unlabored, normal respiratory rate Heart: rate and rhythm normal. No gallop or murmurs noted on exam  Extremities: No gross deformities Skin: Skin color, texture, turgor normal. No rashes seen  Psych: Appropriate mood and affect. Neurologic: Mental status: Alert, oriented to person, place, and time, thought content appropriate.  No results found for: POCGLU  Lab Results  Component Value Date   HGBA1C 5.3 06/03/2018            Assessment & Plan:  1. Other secondary chronic gout of multiple sites without tophus *** - Uric Acid  2. Alcohol abuse *** - Comprehensive metabolic panel  3. Blood loss secondary head injury  *** - CBC with Differential           Molli Barrows, FNP Primary Care at [facility] [address] [place] [phone]: [phone]

## 2019-01-26 ENCOUNTER — Ambulatory Visit (INDEPENDENT_AMBULATORY_CARE_PROVIDER_SITE_OTHER): Payer: Medicare Other | Admitting: Family Medicine

## 2019-01-26 DIAGNOSIS — M1A9XX0 Chronic gout, unspecified, without tophus (tophi): Secondary | ICD-10-CM | POA: Diagnosis not present

## 2019-01-26 DIAGNOSIS — M25511 Pain in right shoulder: Secondary | ICD-10-CM | POA: Diagnosis not present

## 2019-01-26 DIAGNOSIS — F1010 Alcohol abuse, uncomplicated: Secondary | ICD-10-CM | POA: Diagnosis not present

## 2019-01-26 DIAGNOSIS — I10 Essential (primary) hypertension: Secondary | ICD-10-CM | POA: Diagnosis not present

## 2019-01-26 LAB — COMPREHENSIVE METABOLIC PANEL
ALT: 62 IU/L — ABNORMAL HIGH (ref 0–44)
AST: 53 IU/L — ABNORMAL HIGH (ref 0–40)
Albumin/Globulin Ratio: 1.8 (ref 1.2–2.2)
Albumin: 4.2 g/dL (ref 4.0–5.0)
Alkaline Phosphatase: 131 IU/L — ABNORMAL HIGH (ref 39–117)
BUN/Creatinine Ratio: 8 — ABNORMAL LOW (ref 9–20)
BUN: 5 mg/dL — ABNORMAL LOW (ref 6–24)
Bilirubin Total: 0.3 mg/dL (ref 0.0–1.2)
CO2: 21 mmol/L (ref 20–29)
Calcium: 9.4 mg/dL (ref 8.7–10.2)
Chloride: 101 mmol/L (ref 96–106)
Creatinine, Ser: 0.64 mg/dL — ABNORMAL LOW (ref 0.76–1.27)
GFR calc Af Amer: 136 mL/min/{1.73_m2} (ref >59)
GFR calc non Af Amer: 117 mL/min/{1.73_m2} (ref >59)
Globulin, Total: 2.3 g/dL (ref 1.5–4.5)
Glucose: 86 mg/dL (ref 65–99)
Potassium: 4.6 mmol/L (ref 3.5–5.2)
Sodium: 142 mmol/L (ref 134–144)
Total Protein: 6.5 g/dL (ref 6.0–8.5)

## 2019-01-26 LAB — CBC WITH DIFFERENTIAL/PLATELET
Basophils Absolute: 0.1 10*3/uL (ref 0.0–0.2)
Basos: 1 %
EOS (ABSOLUTE): 0.1 10*3/uL (ref 0.0–0.4)
Eos: 1 %
Hematocrit: 37 % — ABNORMAL LOW (ref 37.5–51.0)
Hemoglobin: 12.5 g/dL — ABNORMAL LOW (ref 13.0–17.7)
Immature Grans (Abs): 0.1 10*3/uL (ref 0.0–0.1)
Immature Granulocytes: 1 %
Lymphocytes Absolute: 2.6 10*3/uL (ref 0.7–3.1)
Lymphs: 31 %
MCH: 32.8 pg (ref 26.6–33.0)
MCHC: 33.8 g/dL (ref 31.5–35.7)
MCV: 97 fL (ref 79–97)
Monocytes Absolute: 0.6 10*3/uL (ref 0.1–0.9)
Monocytes: 7 %
Neutrophils Absolute: 5 10*3/uL (ref 1.4–7.0)
Neutrophils: 59 %
Platelets: 571 10*3/uL — ABNORMAL HIGH (ref 150–450)
RBC: 3.81 x10E6/uL — ABNORMAL LOW (ref 4.14–5.80)
RDW: 13.6 % (ref 11.6–15.4)
WBC: 8.5 10*3/uL (ref 3.4–10.8)

## 2019-01-26 LAB — URIC ACID: Uric Acid: 7 mg/dL (ref 3.7–8.6)

## 2019-01-26 MED ORDER — CYCLOBENZAPRINE HCL 5 MG PO TABS: 5.0000 mg | ORAL_TABLET | Freq: Three times a day (TID) | ORAL | 0 refills | Status: DC | PRN

## 2019-01-26 MED ORDER — ALLOPURINOL 100 MG PO TABS: 200.0000 mg | ORAL_TABLET | Freq: Every day | ORAL | 2 refills | Status: DC

## 2019-01-26 MED ORDER — PANTOPRAZOLE SODIUM 40 MG PO TBEC: 40.0000 mg | DELAYED_RELEASE_TABLET | Freq: Every day | ORAL | 1 refills | Status: DC

## 2019-01-26 MED ORDER — METOPROLOL TARTRATE 25 MG PO TABS: 12.5000 mg | ORAL_TABLET | Freq: Two times a day (BID) | ORAL | 2 refills | Status: DC

## 2019-01-26 MED ORDER — PREDNISONE 20 MG PO TABS: 60.0000 mg | ORAL_TABLET | Freq: Every day | ORAL | 0 refills | Status: AC

## 2019-01-26 MED ORDER — AMLODIPINE BESYLATE 10 MG PO TABS: 10.0000 mg | ORAL_TABLET | Freq: Every day | ORAL | 3 refills | Status: DC

## 2019-01-26 NOTE — Progress Notes (Signed)
Virtual Visit via Telephone Note  I connected with [name] on 01/26/19 at  3:50 PM EDT by telephone and verified that I am speaking with the correct person using two identifiers.  Location: Patient: Located at home during today's encounter  Provider: Located at primary care office   I discussed the limitations, risks, security and privacy concerns of performing an evaluation and management service by telephone and the availability of in person appointments. I also discussed with the patient that there may be a patient responsible charge related to this service. The patient expressed understanding and agreed to proceed.  History of Present Illness: Hypertension [name] suffers from hypertension.  He does not monitor his blood pressure at home.  He is a current smoker and abuses alcohol. He reports compliance with medication.  He denies any chest pain, shortness of breath, headache, or new weakness. During recent hospitalization blood pressures were mildly elevated elevated however, patient did experience alcohol withdrawal as he was admitted for alcohol intoxication after sustaining a fall and suffering a head injury. He was seen in the office yesterday for the staple removal procedure and blood pressure was at goal.  He is an active of routine physical activity. Current BMI 29.89.  Gout  Suffers from chronic gout with frequent exacerbations related to chronic alcohol use. He is currently prescribed allopurinol 100 mg twice daily.  He reports compliance with medication.  Last gout exacerbation was approximately 3 weeks ago.  Current uric acid level per recent labs to 7.0.   Shoulder injury, right  Patient sustained a fall on 01/10/2019 in which he suspects that he injured his right shoulder blade.  Patient was recently hospitalized with intoxication resulting in a fall right shoulder blade area is currently painful.  Endorses pain radiates down and arm and he feels that his right hand is  weaker than normal.  He denies any improvement in right shoulder pain with positioning of arm.  He characterizes pain as a constant aching. He has taken Aleve without significant relief of pain.  No  imaging of right shoulder during recent hospitalization.  Assessment and Plan: 1. Essential hypertension, controlled  Continue metoprolol and amlodipine as prescribed. Encourage routine physical activity to facilitate weight management and good blood pressure control. 2. Acute pain of right shoulder -Trial of prednisone 60 mg x 5 days.  Cyclobenzaprine prescribed 5 mg 3 times daily as needed with a limited quantity of 7 days.  If no improvement in symptoms will obtain a image of the right shoulder.  Patient encouraged to R rest and apply ice and elevate right arm. 3. Chronic gout without tophus, unspecified cause, unspecified site -Continue allopurinol as prescribed.  Encouraged to cut back on alcohol usage.  4.  Alcohol abuse -Patient is not ready for alcohol rehabilitation.  He is currently attending AA and admits to inconsistent attendance.  He declines resources for alcohol addiction management.  Follow Up Instructions: Return for follow-up in 6 months.  Patient advised to call back in 1 week if shoulder pain has not improved.  Patient verbalized understanding.   I discussed the assessment and treatment plan with the patient. The patient was provided an opportunity to ask questions and all were answered. The patient agreed with the plan and demonstrated an understanding of the instructions.   The patient was advised to call back or seek an in-person evaluation if the symptoms worsen or if the condition fails to improve as anticipated.  I provided 25 minutes of non-face-to-face time during  this encounter.   Molli Barrows, FNP

## 2019-01-26 NOTE — Progress Notes (Deleted)
Called patient to initiate their telephone visit with provider Molli Barrows, FNP-C. Verified date of birth. Here for BP/GERD follow up. Needs refills. KWalker, CMA.

## 2019-03-02 ENCOUNTER — Emergency Department (HOSPITAL_COMMUNITY): Payer: Medicare Other

## 2019-03-02 ENCOUNTER — Other Ambulatory Visit: Payer: Self-pay

## 2019-03-02 ENCOUNTER — Inpatient Hospital Stay (HOSPITAL_COMMUNITY)
Admission: EM | Admit: 2019-03-02 | Discharge: 2019-03-08 | DRG: 896 | Disposition: A | Discharge status: Home / Self Care | Payer: Medicare Other | Attending: Internal Medicine | Admitting: Internal Medicine

## 2019-03-02 ENCOUNTER — Encounter (HOSPITAL_COMMUNITY): Payer: Self-pay | Admitting: Emergency Medicine

## 2019-03-02 DIAGNOSIS — M6282 Rhabdomyolysis: Secondary | ICD-10-CM | POA: Diagnosis present

## 2019-03-02 DIAGNOSIS — F251 Schizoaffective disorder, depressive type: Secondary | ICD-10-CM | POA: Diagnosis present

## 2019-03-02 DIAGNOSIS — Z818 Family history of other mental and behavioral disorders: Secondary | ICD-10-CM

## 2019-03-02 DIAGNOSIS — D6959 Other secondary thrombocytopenia: Secondary | ICD-10-CM | POA: Diagnosis present

## 2019-03-02 DIAGNOSIS — F10231 Alcohol dependence with withdrawal delirium: Principal | ICD-10-CM | POA: Diagnosis present

## 2019-03-02 DIAGNOSIS — E871 Hypo-osmolality and hyponatremia: Secondary | ICD-10-CM | POA: Diagnosis present

## 2019-03-02 DIAGNOSIS — E876 Hypokalemia: Secondary | ICD-10-CM | POA: Diagnosis present

## 2019-03-02 DIAGNOSIS — F1010 Alcohol abuse, uncomplicated: Secondary | ICD-10-CM

## 2019-03-02 DIAGNOSIS — R61 Generalized hyperhidrosis: Secondary | ICD-10-CM | POA: Diagnosis not present

## 2019-03-02 DIAGNOSIS — G312 Degeneration of nervous system due to alcohol: Secondary | ICD-10-CM | POA: Diagnosis present

## 2019-03-02 DIAGNOSIS — Z20828 Contact with and (suspected) exposure to other viral communicable diseases: Secondary | ICD-10-CM | POA: Diagnosis present

## 2019-03-02 DIAGNOSIS — N179 Acute kidney failure, unspecified: Secondary | ICD-10-CM | POA: Diagnosis present

## 2019-03-02 DIAGNOSIS — F1210 Cannabis abuse, uncomplicated: Secondary | ICD-10-CM | POA: Diagnosis present

## 2019-03-02 DIAGNOSIS — F4310 Post-traumatic stress disorder, unspecified: Secondary | ICD-10-CM | POA: Diagnosis present

## 2019-03-02 DIAGNOSIS — K7010 Alcoholic hepatitis without ascites: Secondary | ICD-10-CM | POA: Diagnosis present

## 2019-03-02 DIAGNOSIS — L989 Disorder of the skin and subcutaneous tissue, unspecified: Secondary | ICD-10-CM | POA: Diagnosis present

## 2019-03-02 DIAGNOSIS — F419 Anxiety disorder, unspecified: Secondary | ICD-10-CM | POA: Diagnosis present

## 2019-03-02 DIAGNOSIS — R404 Transient alteration of awareness: Secondary | ICD-10-CM | POA: Diagnosis not present

## 2019-03-02 DIAGNOSIS — R402352 Coma scale, best motor response, localizes pain, at arrival to emergency department: Secondary | ICD-10-CM | POA: Diagnosis present

## 2019-03-02 DIAGNOSIS — D539 Nutritional anemia, unspecified: Secondary | ICD-10-CM | POA: Diagnosis present

## 2019-03-02 DIAGNOSIS — R000 Tachycardia, unspecified: Secondary | ICD-10-CM | POA: Diagnosis not present

## 2019-03-02 DIAGNOSIS — E669 Obesity, unspecified: Secondary | ICD-10-CM | POA: Diagnosis present

## 2019-03-02 DIAGNOSIS — E8729 Other acidosis: Secondary | ICD-10-CM

## 2019-03-02 DIAGNOSIS — Z8249 Family history of ischemic heart disease and other diseases of the circulatory system: Secondary | ICD-10-CM

## 2019-03-02 DIAGNOSIS — R4182 Altered mental status, unspecified: Secondary | ICD-10-CM | POA: Diagnosis not present

## 2019-03-02 DIAGNOSIS — Z781 Physical restraint status: Secondary | ICD-10-CM

## 2019-03-02 DIAGNOSIS — E7220 Disorder of urea cycle metabolism, unspecified: Secondary | ICD-10-CM

## 2019-03-02 DIAGNOSIS — F10939 Alcohol use, unspecified with withdrawal, unspecified: Secondary | ICD-10-CM | POA: Diagnosis present

## 2019-03-02 DIAGNOSIS — Z6828 Body mass index (BMI) 28.0-28.9, adult: Secondary | ICD-10-CM

## 2019-03-02 DIAGNOSIS — K760 Fatty (change of) liver, not elsewhere classified: Secondary | ICD-10-CM | POA: Diagnosis present

## 2019-03-02 DIAGNOSIS — Z811 Family history of alcohol abuse and dependence: Secondary | ICD-10-CM

## 2019-03-02 DIAGNOSIS — R402212 Coma scale, best verbal response, none, at arrival to emergency department: Secondary | ICD-10-CM | POA: Diagnosis present

## 2019-03-02 DIAGNOSIS — Z79899 Other long term (current) drug therapy: Secondary | ICD-10-CM

## 2019-03-02 DIAGNOSIS — E872 Acidosis: Secondary | ICD-10-CM | POA: Diagnosis present

## 2019-03-02 DIAGNOSIS — K219 Gastro-esophageal reflux disease without esophagitis: Secondary | ICD-10-CM | POA: Diagnosis present

## 2019-03-02 DIAGNOSIS — M109 Gout, unspecified: Secondary | ICD-10-CM | POA: Diagnosis present

## 2019-03-02 DIAGNOSIS — D696 Thrombocytopenia, unspecified: Secondary | ICD-10-CM | POA: Diagnosis not present

## 2019-03-02 DIAGNOSIS — F10239 Alcohol dependence with withdrawal, unspecified: Secondary | ICD-10-CM | POA: Diagnosis present

## 2019-03-02 DIAGNOSIS — F10931 Alcohol use, unspecified with withdrawal delirium: Secondary | ICD-10-CM

## 2019-03-02 DIAGNOSIS — I10 Essential (primary) hypertension: Secondary | ICD-10-CM | POA: Diagnosis present

## 2019-03-02 DIAGNOSIS — R402142 Coma scale, eyes open, spontaneous, at arrival to emergency department: Secondary | ICD-10-CM | POA: Diagnosis present

## 2019-03-02 DIAGNOSIS — F1910 Other psychoactive substance abuse, uncomplicated: Secondary | ICD-10-CM | POA: Diagnosis not present

## 2019-03-02 DIAGNOSIS — R0689 Other abnormalities of breathing: Secondary | ICD-10-CM | POA: Diagnosis not present

## 2019-03-02 DIAGNOSIS — Z888 Allergy status to other drugs, medicaments and biological substances status: Secondary | ICD-10-CM

## 2019-03-02 DIAGNOSIS — F17210 Nicotine dependence, cigarettes, uncomplicated: Secondary | ICD-10-CM | POA: Diagnosis present

## 2019-03-02 LAB — RAPID URINE DRUG SCREEN, HOSP PERFORMED
Amphetamines: NOT DETECTED
Barbiturates: NOT DETECTED
Benzodiazepines: NOT DETECTED
Cocaine: NOT DETECTED
Opiates: NOT DETECTED
Tetrahydrocannabinol: POSITIVE — AB

## 2019-03-02 LAB — BASIC METABOLIC PANEL
Anion gap: 12 (ref 5–15)
Anion gap: 10 (ref 5–15)
BUN: 10 mg/dL (ref 6–20)
BUN: 10 mg/dL (ref 6–20)
CO2: 26 mmol/L (ref 22–32)
CO2: 26 mmol/L (ref 22–32)
Calcium: 8.3 mg/dL — ABNORMAL LOW (ref 8.9–10.3)
Calcium: 8.3 mg/dL — ABNORMAL LOW (ref 8.9–10.3)
Chloride: 97 mmol/L — ABNORMAL LOW (ref 98–111)
Chloride: 98 mmol/L (ref 98–111)
Creatinine, Ser: 0.91 mg/dL (ref 0.61–1.24)
Creatinine, Ser: 0.91 mg/dL (ref 0.61–1.24)
GFR calc Af Amer: >60 mL/min (ref >60)
GFR calc Af Amer: >60 mL/min (ref >60)
GFR calc non Af Amer: >60 mL/min (ref >60)
GFR calc non Af Amer: >60 mL/min (ref >60)
Glucose, Bld: 113 mg/dL — ABNORMAL HIGH (ref 70–99)
Glucose, Bld: 108 mg/dL — ABNORMAL HIGH (ref 70–99)
Potassium: 2.8 mmol/L — ABNORMAL LOW (ref 3.5–5.1)
Potassium: 3.3 mmol/L — ABNORMAL LOW (ref 3.5–5.1)
Sodium: 135 mmol/L (ref 135–145)
Sodium: 134 mmol/L — ABNORMAL LOW (ref 135–145)

## 2019-03-02 LAB — COMPREHENSIVE METABOLIC PANEL
ALT: 71 U/L — ABNORMAL HIGH (ref 0–44)
ALT: 63 U/L — ABNORMAL HIGH (ref 0–44)
AST: 204 U/L — ABNORMAL HIGH (ref 15–41)
AST: 218 U/L — ABNORMAL HIGH (ref 15–41)
Albumin: 3.7 g/dL (ref 3.5–5.0)
Albumin: 3 g/dL — ABNORMAL LOW (ref 3.5–5.0)
Alkaline Phosphatase: 88 U/L (ref 38–126)
Alkaline Phosphatase: 71 U/L (ref 38–126)
Anion gap: 18 — ABNORMAL HIGH (ref 5–15)
Anion gap: 14 (ref 5–15)
BUN: 10 mg/dL (ref 6–20)
BUN: 12 mg/dL (ref 6–20)
CO2: 25 mmol/L (ref 22–32)
CO2: 23 mmol/L (ref 22–32)
Calcium: 9.3 mg/dL (ref 8.9–10.3)
Calcium: 8.4 mg/dL — ABNORMAL LOW (ref 8.9–10.3)
Chloride: 89 mmol/L — ABNORMAL LOW (ref 98–111)
Chloride: 96 mmol/L — ABNORMAL LOW (ref 98–111)
Creatinine, Ser: 1.19 mg/dL (ref 0.61–1.24)
Creatinine, Ser: 0.78 mg/dL (ref 0.61–1.24)
GFR calc Af Amer: >60 mL/min (ref >60)
GFR calc Af Amer: >60 mL/min (ref >60)
GFR calc non Af Amer: >60 mL/min (ref >60)
GFR calc non Af Amer: >60 mL/min (ref >60)
Glucose, Bld: 102 mg/dL — ABNORMAL HIGH (ref 70–99)
Glucose, Bld: 103 mg/dL — ABNORMAL HIGH (ref 70–99)
Potassium: 3.2 mmol/L — ABNORMAL LOW (ref 3.5–5.1)
Potassium: 2.6 mmol/L — CL (ref 3.5–5.1)
Sodium: 132 mmol/L — ABNORMAL LOW (ref 135–145)
Sodium: 133 mmol/L — ABNORMAL LOW (ref 135–145)
Total Bilirubin: 2.3 mg/dL — ABNORMAL HIGH (ref 0.3–1.2)
Total Bilirubin: 1.7 mg/dL — ABNORMAL HIGH (ref 0.3–1.2)
Total Protein: 6.8 g/dL (ref 6.5–8.1)
Total Protein: 5.5 g/dL — ABNORMAL LOW (ref 6.5–8.1)

## 2019-03-02 LAB — LACTIC ACID, PLASMA
Lactic Acid, Venous: 7 mmol/L (ref 0.5–1.9)
Lactic Acid, Venous: 3.4 mmol/L (ref 0.5–1.9)
Lactic Acid, Venous: 1.6 mmol/L (ref 0.5–1.9)

## 2019-03-02 LAB — LIPID PANEL
Cholesterol: 159 mg/dL (ref 0–200)
HDL: 53 mg/dL (ref >40)
LDL Cholesterol: 84 mg/dL (ref 0–99)
Total CHOL/HDL Ratio: 3 RATIO
Triglycerides: 108 mg/dL (ref <150)
VLDL: 22 mg/dL (ref 0–40)

## 2019-03-02 LAB — CBC WITH DIFFERENTIAL/PLATELET
Abs Immature Granulocytes: 0.03 10*3/uL (ref 0.00–0.07)
Basophils Absolute: 0 10*3/uL (ref 0.0–0.1)
Basophils Relative: 0 %
Eosinophils Absolute: 0 10*3/uL (ref 0.0–0.5)
Eosinophils Relative: 0 %
HCT: 44.1 % (ref 39.0–52.0)
Hemoglobin: 15.2 g/dL (ref 13.0–17.0)
Immature Granulocytes: 1 %
Lymphocytes Relative: 8 %
Lymphs Abs: 0.5 10*3/uL — ABNORMAL LOW (ref 0.7–4.0)
MCH: 34.6 pg — ABNORMAL HIGH (ref 26.0–34.0)
MCHC: 34.5 g/dL (ref 30.0–36.0)
MCV: 100.5 fL — ABNORMAL HIGH (ref 80.0–100.0)
Monocytes Absolute: 0.9 10*3/uL (ref 0.1–1.0)
Monocytes Relative: 14 %
Neutro Abs: 4.7 10*3/uL (ref 1.7–7.7)
Neutrophils Relative %: 77 %
Platelets: 110 10*3/uL — ABNORMAL LOW (ref 150–400)
RBC: 4.39 MIL/uL (ref 4.22–5.81)
RDW: 15.3 % (ref 11.5–15.5)
WBC: 6.1 10*3/uL (ref 4.0–10.5)
nRBC: 0 % (ref 0.0–0.2)

## 2019-03-02 LAB — SALICYLATE LEVEL: Salicylate Lvl: <7 mg/dL (ref 2.8–30.0)

## 2019-03-02 LAB — PROTIME-INR
INR: 1 (ref 0.8–1.2)
Prothrombin Time: 12.6 seconds (ref 11.4–15.2)

## 2019-03-02 LAB — URINALYSIS, ROUTINE W REFLEX MICROSCOPIC
Bacteria, UA: NONE SEEN
Bilirubin Urine: NEGATIVE
Glucose, UA: NEGATIVE mg/dL
Ketones, ur: 5 mg/dL — AB
Leukocytes,Ua: NEGATIVE
Nitrite: NEGATIVE
Protein, ur: 100 mg/dL — AB
Specific Gravity, Urine: 1.02 (ref 1.005–1.030)
pH: 7 (ref 5.0–8.0)

## 2019-03-02 LAB — ACETAMINOPHEN LEVEL: Acetaminophen (Tylenol), Serum: <10 ug/mL — ABNORMAL LOW (ref 10–30)

## 2019-03-02 LAB — MAGNESIUM: Magnesium: 2.1 mg/dL (ref 1.7–2.4)

## 2019-03-02 LAB — CK
Total CK: 5135 U/L — ABNORMAL HIGH (ref 49–397)
Total CK: 7462 U/L — ABNORMAL HIGH (ref 49–397)

## 2019-03-02 LAB — ETHANOL: Alcohol, Ethyl (B): <10 mg/dL (ref <10)

## 2019-03-02 LAB — MRSA PCR SCREENING: MRSA by PCR: NEGATIVE

## 2019-03-02 LAB — SARS CORONAVIRUS 2 BY RT PCR (HOSPITAL ORDER, PERFORMED IN ~~LOC~~ HOSPITAL LAB): SARS Coronavirus 2: NEGATIVE

## 2019-03-02 LAB — CBG MONITORING, ED: Glucose-Capillary: 116 mg/dL — ABNORMAL HIGH (ref 70–99)

## 2019-03-02 LAB — AMMONIA: Ammonia: 47 umol/L — ABNORMAL HIGH (ref 9–35)

## 2019-03-02 LAB — GLUCOSE, CAPILLARY
Glucose-Capillary: 108 mg/dL — ABNORMAL HIGH (ref 70–99)
Glucose-Capillary: 121 mg/dL — ABNORMAL HIGH (ref 70–99)

## 2019-03-02 LAB — PHOSPHORUS: Phosphorus: 2.2 mg/dL — ABNORMAL LOW (ref 2.5–4.6)

## 2019-03-02 MED ORDER — CHLORHEXIDINE GLUCONATE CLOTH 2 % EX PADS
6.0000 | MEDICATED_PAD | Freq: Every day | CUTANEOUS | Status: DC
  Administered 2019-03-02 – 2019-03-04 (×3): 6 via TOPICAL

## 2019-03-02 MED ORDER — SODIUM BICARBONATE 8.4 % IV SOLN
INTRAVENOUS | Status: DC
  Administered 2019-03-02: 05:00:00 via INTRAVENOUS
  Filled 2019-03-02: qty 150

## 2019-03-02 MED ORDER — ENSURE ENLIVE PO LIQD
237.0000 mL | Freq: Two times a day (BID) | ORAL | Status: DC
  Administered 2019-03-02 – 2019-03-08 (×11): 237 mL via ORAL

## 2019-03-02 MED ORDER — VITAMIN B-1 100 MG PO TABS
100.0000 mg | ORAL_TABLET | Freq: Every day | ORAL | Status: DC
  Administered 2019-03-02 – 2019-03-08 (×7): 100 mg via ORAL
  Filled 2019-03-02 (×7): qty 1

## 2019-03-02 MED ORDER — ADULT MULTIVITAMIN W/MINERALS CH
1.0000 | ORAL_TABLET | Freq: Every day | ORAL | Status: DC
  Administered 2019-03-02 – 2019-03-08 (×7): 1 via ORAL
  Filled 2019-03-02 (×7): qty 1

## 2019-03-02 MED ORDER — PANTOPRAZOLE SODIUM 40 MG PO TBEC
40.0000 mg | DELAYED_RELEASE_TABLET | Freq: Every day | ORAL | Status: DC
  Administered 2019-03-02 – 2019-03-08 (×7): 40 mg via ORAL
  Filled 2019-03-02 (×6): qty 1

## 2019-03-02 MED ORDER — SODIUM BICARBONATE 8.4 % IV SOLN
INTRAVENOUS | Status: DC
  Administered 2019-03-02: 08:00:00 via INTRAVENOUS
  Filled 2019-03-02 (×3): qty 150

## 2019-03-02 MED ORDER — HEPARIN SODIUM (PORCINE) 5000 UNIT/ML IJ SOLN
5000.0000 [IU] | Freq: Three times a day (TID) | INTRAMUSCULAR | Status: DC
  Administered 2019-03-02 – 2019-03-04 (×7): 5000 [IU] via SUBCUTANEOUS
  Filled 2019-03-02 (×7): qty 1

## 2019-03-02 MED ORDER — METOPROLOL TARTRATE 12.5 MG HALF TABLET
12.5000 mg | ORAL_TABLET | Freq: Two times a day (BID) | ORAL | Status: DC
  Administered 2019-03-02 – 2019-03-08 (×12): 12.5 mg via ORAL
  Filled 2019-03-02 (×12): qty 1

## 2019-03-02 MED ORDER — HALOPERIDOL LACTATE 5 MG/ML IJ SOLN
5.0000 mg | Freq: Once | INTRAMUSCULAR | Status: AC
  Administered 2019-03-02: 5 mg via INTRAVENOUS

## 2019-03-02 MED ORDER — LORAZEPAM 2 MG/ML IJ SOLN
1.0000 mg | INTRAMUSCULAR | Status: DC | PRN
  Administered 2019-03-03 (×4): 2 mg via INTRAVENOUS
  Administered 2019-03-03 – 2019-03-04 (×2): 1 mg via INTRAVENOUS
  Administered 2019-03-04 – 2019-03-05 (×2): 2 mg via INTRAVENOUS
  Filled 2019-03-02 (×8): qty 1

## 2019-03-02 MED ORDER — SODIUM CHLORIDE 0.9 % IV SOLN
INTRAVENOUS | Status: DC
  Administered 2019-03-02: 05:00:00 via INTRAVENOUS

## 2019-03-02 MED ORDER — AMLODIPINE BESYLATE 5 MG PO TABS: 10.0000 mg | ORAL_TABLET | Freq: Every day | ORAL | Status: DC

## 2019-03-02 MED ORDER — LORAZEPAM 2 MG/ML IJ SOLN
1.0000 mg | Freq: Once | INTRAMUSCULAR | Status: AC
  Administered 2019-03-02: 1 mg via INTRAVENOUS
  Filled 2019-03-02: qty 1

## 2019-03-02 MED ORDER — POTASSIUM CHLORIDE 10 MEQ/100ML IV SOLN
10.0000 meq | INTRAVENOUS | Status: AC
  Administered 2019-03-02 (×4): 10 meq via INTRAVENOUS
  Filled 2019-03-02 (×4): qty 100

## 2019-03-02 MED ORDER — FOLIC ACID 1 MG PO TABS
1.0000 mg | ORAL_TABLET | Freq: Every day | ORAL | Status: DC
  Administered 2019-03-02 – 2019-03-08 (×7): 1 mg via ORAL
  Filled 2019-03-02 (×7): qty 1

## 2019-03-02 MED ORDER — ALLOPURINOL 100 MG PO TABS
200.0000 mg | ORAL_TABLET | Freq: Every day | ORAL | Status: DC
  Administered 2019-03-02 – 2019-03-08 (×7): 200 mg via ORAL
  Filled 2019-03-02 (×7): qty 2

## 2019-03-02 MED ORDER — DEXMEDETOMIDINE HCL IN NACL 400 MCG/100ML IV SOLN
0.4000 ug/kg/h | INTRAVENOUS | Status: DC
  Administered 2019-03-02: 0.6 ug/kg/h via INTRAVENOUS
  Administered 2019-03-02: 0.4 ug/kg/h via INTRAVENOUS
  Administered 2019-03-02: 0.8 ug/kg/h via INTRAVENOUS
  Administered 2019-03-03: 1.2 ug/kg/h via INTRAVENOUS
  Administered 2019-03-03 – 2019-03-04 (×2): 0.5 ug/kg/h via INTRAVENOUS
  Filled 2019-03-02 (×6): qty 100

## 2019-03-02 MED ORDER — DEXMEDETOMIDINE HCL IN NACL 200 MCG/50ML IV SOLN
0.4000 ug/kg/h | INTRAVENOUS | Status: DC
  Administered 2019-03-02: 1.2 ug/kg/h via INTRAVENOUS
  Administered 2019-03-02: 04:00:00 0.4 ug/kg/h via INTRAVENOUS
  Filled 2019-03-02 (×2): qty 50

## 2019-03-02 MED ORDER — POTASSIUM PHOSPHATES 15 MMOLE/5ML IV SOLN
20.0000 mmol | Freq: Once | INTRAVENOUS | Status: AC
  Administered 2019-03-02: 15:00:00 20 mmol via INTRAVENOUS
  Filled 2019-03-02: qty 6.67

## 2019-03-02 MED ORDER — LACTATED RINGERS IV SOLN
INTRAVENOUS | Status: DC
  Administered 2019-03-02 – 2019-03-04 (×6): via INTRAVENOUS

## 2019-03-02 MED ORDER — THIAMINE HCL 100 MG/ML IJ SOLN
Freq: Once | INTRAVENOUS | Status: AC
  Administered 2019-03-02: 03:00:00 via INTRAVENOUS
  Filled 2019-03-02: qty 1000

## 2019-03-02 MED ORDER — HALOPERIDOL LACTATE 5 MG/ML IJ SOLN
INTRAMUSCULAR | Status: AC
  Filled 2019-03-02: qty 1

## 2019-03-02 MED ORDER — POTASSIUM CHLORIDE 10 MEQ/100ML IV SOLN
10.0000 meq | INTRAVENOUS | Status: AC
  Administered 2019-03-02 (×6): 10 meq via INTRAVENOUS
  Filled 2019-03-02 (×6): qty 100

## 2019-03-02 MED ORDER — SERTRALINE HCL 50 MG PO TABS
75.0000 mg | ORAL_TABLET | Freq: Every day | ORAL | Status: DC
  Administered 2019-03-02 – 2019-03-08 (×7): 75 mg via ORAL
  Filled 2019-03-02: qty 2
  Filled 2019-03-02: qty 1
  Filled 2019-03-02 (×2): qty 2
  Filled 2019-03-02: qty 1
  Filled 2019-03-02: qty 2
  Filled 2019-03-02: qty 1

## 2019-03-02 NOTE — Progress Notes (Signed)
 Initial Nutrition Assessment  DOCUMENTATION CODES:   Not applicable  INTERVENTION:   Ensure Enlive po BID, each supplement provides 350 kcal and 20 grams of protein  Liberalize diet to REGULAR  Continue MVI with Minerals   NUTRITION DIAGNOSIS:   Inadequate oral intake related to social / environmental circumstances, decreased appetite as evidenced by per patient/family report.  GOAL:   Patient will meet greater than or equal to 90% of their needs  MONITOR:   PO intake, Supplement acceptance, Labs, Weight trends  REASON FOR ASSESSMENT:   Malnutrition Screening Tool    ASSESSMENT:   48 yo male admitted with AKI, rhabdomyolysis, EtOH withdrawal on CIWA. PMH includes HTN, chronic EtOH abuse, seizures, PTSD, GERD, smoker, gout, depression   Agitated overnight requiring precedex drip, currently being weaned  Current wt 94 kg; noted weight of 100 kg in June of 2020, weight of 96.4 kg in May of 2020.   Unable to get good diet and weight history from patient at this time Noted pt reporting he has not had a drink in 3-4 days PTA; per chart review, pt with hx of drinking 20 beers per day  Labs: potassium 2.8 (L), Creatinine wdl, phosphorus 2.2 (L), CBG 108, Creatinine wdl Meds: folic acid, LR at 546 ml/hr, MVI with Minerals, KCL, potassium phosphate, thiamine  Diet Order:  HEART HEALTHY/CARB MOD  EDUCATION NEEDS:   Not appropriate for education at this time  Skin:  Skin Assessment: Reviewed RN Assessment  Last BM:  7/15  Height:   Ht Readings from Last 1 Encounters:  03/02/19 6' (1.829 m)    Weight:   Wt Readings from Last 1 Encounters:  03/02/19 94 kg    BMI:  Body mass index is 28.11 kg/m.  Estimated Nutritional Needs:   Kcal:  2100-2300 kcals  Protein:  105-115 g  Fluid:  >/= 2 L     Kenise Barraco MS, RDN, LDN, CNSC [phone] Pager  [phone] Weekend/On-Call Pager

## 2019-03-02 NOTE — ED Triage Notes (Signed)
Patient from home, altered mental status.  Patient was on the phone with mother, he screamed and then stopped responding to her.  PD went to home and found him under his coffee table, 50 -75 bottles of tall boys found at home.  Patient has history of TBI and is grabbing at staff upon arrival.

## 2019-03-02 NOTE — Progress Notes (Addendum)
Interim Progress Note  S: Admitted overnight for alcohol withdrawal, requiring precedex drip, rhabdomyolysis.  See H&P for detailed history and plan.  Has been weaning on precedex drip, decreased from 1.2 to 0.8 mcg/kg/hr this AM.    O: BP 99/66   Pulse 89   Temp 98.4 F (36.9 C) (Oral)   Resp 20   Ht 6' (1.829 m)   Wt 94 kg   SpO2 93%   BMI 28.11 kg/m    Physical Exam:  General: 48 y.o. obese male in NAD, lying in bed Cardio: RRR no m/r/g Lungs: CTAB, no wheezing, no rhonchi, no crackles, no IWOB on RA Abdomen: Soft, non-tender to palpation, non-distended, positive bowel sounds, no HSM Skin: warm and dry Extremities: No edema, note ecchymosis over extremities, moves all four extremities Neuro: sleeping but arouses to voice, A&Ox2, thinks year is 2019, cannot state president or reason for hospitalization   A/P: Alcohol Withdrawal with Delirium  -frequent neuro checks, CIWA -wean precedex as tolerated: currently 0.8 mcg/kg/hr -prn ativan -hold home amlodipine in setting of low-normal BPs  Rhabdomyolysis  -cont LR, Bicarb  - f/u CK, CMP, Mag, Phos -LA resolved  See H&P for complete plan. Mother updated by phone.  [place] Constable, D.O.  PGY-2 Family Medicine  03/02/2019 8:27 AM

## 2019-03-02 NOTE — ED Notes (Signed)
Patient transported to CT 

## 2019-03-02 NOTE — H&P (Signed)
NAME:  [name], MRN:  [id number], DOB:  Mar 19, 1971, LOS: 0 ADMISSION DATE:  03/02/2019, CONSULTATION DATE:  03/02/19 REFERRING MD:  Randal Buba  CHIEF COMPLAINT:  AMS   Brief History   [name] is a 48 y.o. male who was admitted 7/15 with AMS felt to be due to EtOH withdrawal.  History of present illness   [name] is a 48 y.o. male who has a PMH including but not limited to EtOH abuse, schizoaffective disorder, depression, EtOH seizures (see "past medical history" for rest).  He presented to [facility] ED 7/15 with AMS.  He was apparently talking to his mother when he screamed then stopped responding.  Police went to his house and found him on the floor under a coffee table.  Several empty "tall boy" bottles found at his home (reportedly 50 - 75).  In ED, he required precedex for agitation.  Labs noteable for rhabdo.  Initial lactate 7 which improved to 3.4 after fluids.  UDS positive for THC.  EtOH pending.  CT head / neck negative.  PCCM asked to admit to ICU for presumed EtOH withdrawal and rhabdomyolysis.  Past Medical History  HTN, gout, EtOH abuse, GERD, schizoaffective disorder, depression, EtOH seizures.  Significant Hospital Events   7/15 > admit.  Consults:  None.  Procedures:  None.  Significant Diagnostic Tests:  CTH / neck 7/15 > neg.  Micro Data:  COVID 7/15 > neg.  Antimicrobials:  None.   Interim history/subjective:  Awake, no complaints.  States last drink 3 - 4 days ago because "I was trying to get better".  Objective:  Blood pressure 113/81, pulse (!) 109, temperature 99.6 F (37.6 C), temperature source Temporal, resp. rate 20, height 6' (1.829 m), weight 100 kg, SpO2 98 %.       No intake or output data in the 24 hours ending 03/02/19 0544 Filed Weights   03/02/19 0330  Weight: 100 kg    Examination: General: Adult male, disheveled, in NAD. Neuro: Awake, answers basic questions and follows basic commands appropriately. HEENT: [place]/AT.  Sclerae anicteric.  EOMI. Cardiovascular: RRR, no M/R/G.  Lungs: Respirations even and unlabored.  CTA bilaterally, No W/R/R.  Abdomen: BS x 4, soft, NT/ND.  Musculoskeletal: No gross deformities, no edema.  Skin: Bruising noted to bilateral knees.  Skin otherwise warm, no rashes.  Assessment & Plan:   EtOH withdrawal - DF ~ 5, MELD 11. - Continue precedex gtt with PRN ativan.  Transition precedex to off. - No indication for steroids. - Thiamin / Folate / MV. - EtOH cessation counseling.  Rhabdomyolysis. - LR @ 200. - Trend CK.  Hyponatremia - presumed 2/2 EtOH. Hypokalemia. AGMA - 2/2 lactate. - 4 runs K. - Continue LR. - Follow BMP, lactate.  Transasminitis - consistent with EtOH. - Trend LFT's.  Substance abuse - UDS positive for THC. - Substance abuse counseling.  Hx HTN. - Continue preadmission amlodipine, metoprolol.  Hx gout. - Continue preadmission allopurinol.  Hx GERD. - Continue preadmission PPI.  Hx schizoaffective disorder, depression, EtOH seizures. - Continue preadmission sertraline.  Best Practice:  Diet: Carb Mod. Pain/Anxiety/Delirium protocol (if indicated): N/A. VAP protocol (if indicated): N/A. DVT prophylaxis: SCD's / Heparin. GI prophylaxis: PPI.[name] Glucose control: N/A. Mobility: Bedrest. Code Status: ICU. Family Communication: None available. Disposition: ICU.  Labs   CBC: Recent Labs  Lab 03/02/19 0215  WBC 6.1  NEUTROABS 4.7  HGB 15.2  HCT 44.1  MCV 100.5*  PLT 300*   Basic Metabolic Panel:  Recent Labs  Lab 03/02/19 0215  NA 132*  K 3.2*  CL 89*  CO2 25  GLUCOSE 102*  BUN 10  CREATININE 1.19  CALCIUM 9.3   GFR: Estimated Creatinine Clearance: 95 mL/min (by C-G formula based on SCr of 1.19 mg/dL). Recent Labs  Lab 03/02/19 0215 03/02/19 0221 03/02/19 0421  WBC 6.1  --   --   LATICACIDVEN  --  7.0* 3.4*   Liver Function Tests: Recent Labs  Lab 03/02/19 0215  AST 204*  ALT 71*  ALKPHOS 88  BILITOT  2.3*  PROT 6.8  ALBUMIN 3.7   No results for input(s): LIPASE, AMYLASE in the last 168 hours. Recent Labs  Lab 03/02/19 0223  AMMONIA 47*   ABG    Component Value Date/Time   TCO2 23 06/04/2015 0234    Coagulation Profile: Recent Labs  Lab 03/02/19 0215  INR 1.0   Cardiac Enzymes: Recent Labs  Lab 03/02/19 0215  CKTOTAL 5,135*   HbA1C: Hemoglobin A1C  Date/Time Value Ref Range Status  09/13/2015 09:14 AM 5.30  Final   Hgb A1c MFr Bld  Date/Time Value Ref Range Status  06/03/2018 06:39 AM 5.3 4.8 - 5.6 % Final    Comment:    (NOTE) Pre diabetes:          5.7%-6.4% Diabetes:              >6.4% Glycemic control for   <7.0% adults with diabetes    CBG: Recent Labs  Lab 03/02/19 0217  GLUCAP 116*    Review of Systems:   All negative; except for those that are bolded, which indicate positives.  Constitutional: weight loss, weight gain, night sweats, fevers, chills, fatigue, weakness.  HEENT: headaches, sore throat, sneezing, nasal congestion, post nasal drip, difficulty swallowing, tooth/dental problems, visual complaints, visual changes, ear aches. Neuro: difficulty with speech, weakness, numbness, ataxia. CV:  chest pain, orthopnea, PND, swelling in lower extremities, dizziness, palpitations, syncope.  Resp: cough, hemoptysis, dyspnea, wheezing. GI: heartburn, indigestion, abdominal pain, nausea, vomiting, diarrhea, constipation, change in bowel habits, loss of appetite, hematemesis, melena, hematochezia.  GU: dysuria, change in color of urine, urgency or frequency, flank pain, hematuria. MSK: joint pain or swelling, decreased range of motion. Psych: change in mood or affect, depression, anxiety, suicidal ideations, homicidal ideations. Skin: rash, itching, bruising.   Past medical history  He,  has a past medical history of Alcohol abuse, Anxiety, Depression, Psychosis ([place]), PTSD (post-traumatic stress disorder), Schizoaffective disorder, and Seizure  disorder ([place]).   Surgical History    Past Surgical History:  Procedure Laterality Date  . FOOT SURGERY     right  . HAND SURGERY       Social History   reports that he has been smoking cigarettes. He has a 24.00 pack-year smoking history. He has never used smokeless tobacco. He reports current alcohol use. He reports current drug use. Frequency: 1.00 time per week. Drug: Marijuana.   Family history   His family history includes Alcoholism in his mother; CAD in his father; Depression in his brother.   Allergies Allergies  Allergen Reactions  . Wellbutrin [Bupropion] Other (See Comments)    Reaction:  Seizure      Home meds  Prior to Admission medications   Medication Sig Start Date End Date Taking? Authorizing Provider  allopurinol (ZYLOPRIM) 100 MG tablet Take 2 tablets (200 mg total) by mouth daily. 01/26/19 04/26/19  Scot Jun, FNP  amLODipine (NORVASC) 10 MG tablet Take 1  tablet (10 mg total) by mouth daily. 01/26/19 05/26/19  Scot Jun, FNP  cyclobenzaprine (FLEXERIL) 5 MG tablet Take 1 tablet (5 mg total) by mouth 3 (three) times daily as needed for muscle spasms. 01/26/19   Scot Jun, FNP  metoprolol tartrate (LOPRESSOR) 25 MG tablet Take 0.5 tablets (12.5 mg total) by mouth 2 (two) times daily. 01/26/19   Scot Jun, FNP  pantoprazole (PROTONIX) 40 MG tablet Take 1 tablet (40 mg total) by mouth daily. 01/26/19   Scot Jun, FNP  sertraline (ZOLOFT) 50 MG tablet Take 75 mg by mouth daily.  12/14/18   [provider]     Montey Hora, [facility] Pager: [phone].  If no answer, [phone] 03/02/2019, 5:44 AM

## 2019-03-02 NOTE — ED Provider Notes (Signed)
[facility] EMERGENCY DEPARTMENT Provider Note   CSN: [id number] Arrival date & time: 03/02/19  0207     History   Chief Complaint Chief Complaint  Patient presents with  . Altered Mental Status    HPI [name] is a 48 y.o. male.     The history is provided by the EMS personnel. The history is limited by the condition of the patient.  Altered Mental Status Presenting symptoms: behavior changes, combativeness and confusion   Severity:  Severe Most recent episode:  Today Episode history:  Single Timing:  Constant Progression:  Unchanged Chronicity:  New Context: alcohol use and drug use   Associated symptoms: agitation   Associated symptoms: no weakness   Patient with a h/o ETOH abuse was speaking with mother via phone reportedly and then was not responding.  Found on flood under the table.    Past Medical History:  Diagnosis Date  . Alcohol abuse   . Anxiety    at age 82  . Depression    at age 71  . Psychosis ([place])   . PTSD (post-traumatic stress disorder)   . Schizoaffective disorder   . Seizure disorder ([place])    related to etoh seizure    Patient Active Problem List   Diagnosis Date Noted  . Alcohol withdrawal ([place]) 01/15/2019  . HTN (hypertension) 01/10/2019  . Fall 01/10/2019  . Alcohol intoxication ([place]) 01/10/2019  . Scalp laceration   . Schizoaffective disorder, bipolar type ([place]) 06/02/2018  . Low back pain 08/21/2016  . Alcohol dependence with uncomplicated withdrawal ([place]) 02/22/2016  . Alcohol-induced mood disorder ([place]) 02/22/2016  . Gout 10/22/2015  . GERD (gastroesophageal reflux disease) 10/22/2015  . Alcohol withdrawal seizure with complication ([place]) 78/29/5621  . Schizoaffective disorder, depressive type ([place])   . History of alcohol abuse   . Tobacco dependence due to cigarettes 11/20/2014  . Alcohol abuse with intoxication ([place])   . Hyponatremia 10/18/2014  . Abnormal LFTs   . Post traumatic stress disorder  (PTSD) 11/03/2011  . Hypokalemia 07/02/2011    Past Surgical History:  Procedure Laterality Date  . FOOT SURGERY     right  . HAND SURGERY          Home Medications    Prior to Admission medications   Medication Sig Start Date End Date Taking? Authorizing Provider  allopurinol (ZYLOPRIM) 100 MG tablet Take 2 tablets (200 mg total) by mouth daily. 01/26/19 04/26/19  Scot Jun, FNP  amLODipine (NORVASC) 10 MG tablet Take 1 tablet (10 mg total) by mouth daily. 01/26/19 05/26/19  Scot Jun, FNP  cyclobenzaprine (FLEXERIL) 5 MG tablet Take 1 tablet (5 mg total) by mouth 3 (three) times daily as needed for muscle spasms. 01/26/19   Scot Jun, FNP  metoprolol tartrate (LOPRESSOR) 25 MG tablet Take 0.5 tablets (12.5 mg total) by mouth 2 (two) times daily. 01/26/19   Scot Jun, FNP  pantoprazole (PROTONIX) 40 MG tablet Take 1 tablet (40 mg total) by mouth daily. 01/26/19   Scot Jun, FNP  sertraline (ZOLOFT) 50 MG tablet Take 75 mg by mouth daily.  12/14/18   [provider]    Family History Family History  Problem Relation Age of Onset  . Alcoholism Mother   . CAD Father   . Depression Brother     Social History Social History   Tobacco Use  . Smoking status: Current Every Day Smoker    Packs/day: 1.00  Years: 24.00    Pack years: 24.00    Types: Cigarettes  . Smokeless tobacco: Never Used  Substance Use Topics  . Alcohol use: Yes    Alcohol/week: 0.0 standard drinks    Frequency: Never    Comment: drinking daily   . Drug use: Yes    Frequency: 1.0 times per week    Types: Marijuana    Comment: THC 2 to 3 times per month. last use 09/24/2015     Allergies   Wellbutrin [bupropion]   Review of Systems Review of Systems  Unable to perform ROS: Acuity of condition  Neurological: Negative for weakness.  Psychiatric/Behavioral: Positive for agitation and confusion.     Physical Exam Updated Vital Signs BP (!)  130/127 (BP Location: Right Arm)   Pulse (!) 145   Temp 99.6 F (37.6 C) (Temporal)   Resp (!) 30   SpO2 99%   Physical Exam Vitals signs and nursing note reviewed.  Constitutional:      Appearance: He is normal weight.  HENT:     Head: Normocephalic and atraumatic.     Right Ear: Tympanic membrane normal.     Left Ear: Tympanic membrane normal.     Nose: Nose normal.  Eyes:     Conjunctiva/sclera: Conjunctivae normal.     Pupils: Pupils are equal, round, and reactive to light.  Neck:     Musculoskeletal: Normal range of motion and neck supple.  Cardiovascular:     Rate and Rhythm: Regular rhythm. Tachycardia present.     Pulses: Normal pulses.     Heart sounds: Normal heart sounds.  Pulmonary:     Effort: Pulmonary effort is normal.     Breath sounds: Normal breath sounds.  Abdominal:     General: Abdomen is flat. Bowel sounds are normal.     Tenderness: There is no abdominal tenderness. There is no guarding or rebound.  Musculoskeletal: Normal range of motion.  Skin:    General: Skin is warm and dry.  Neurological:     GCS: GCS eye subscore is 4. GCS verbal subscore is 1. GCS motor subscore is 5.  Psychiatric:        Behavior: Behavior is agitated.      ED Treatments / Results  Labs (all labs ordered are listed, but only abnormal results are displayed) Results for orders placed or performed during the hospital encounter of 03/02/19  CBC with Differential  Result Value Ref Range   WBC 6.1 4.0 - 10.5 K/uL   RBC 4.39 4.22 - 5.81 MIL/uL   Hemoglobin 15.2 13.0 - 17.0 g/dL   HCT 44.1 39.0 - 52.0 %   MCV 100.5 (H) 80.0 - 100.0 fL   MCH 34.6 (H) 26.0 - 34.0 pg   MCHC 34.5 30.0 - 36.0 g/dL   RDW 15.3 11.5 - 15.5 %   Platelets 110 (L) 150 - 400 K/uL   nRBC 0.0 0.0 - 0.2 %   Neutrophils Relative % 77 %   Neutro Abs 4.7 1.7 - 7.7 K/uL   Lymphocytes Relative 8 %   Lymphs Abs 0.5 (L) 0.7 - 4.0 K/uL   Monocytes Relative 14 %   Monocytes Absolute 0.9 0.1 - 1.0 K/uL    Eosinophils Relative 0 %   Eosinophils Absolute 0.0 0.0 - 0.5 K/uL   Basophils Relative 0 %   Basophils Absolute 0.0 0.0 - 0.1 K/uL   Immature Granulocytes 1 %   Abs Immature Granulocytes 0.03 0.00 - 0.07 K/uL  Lactic acid, plasma  Result Value Ref Range   Lactic Acid, Venous 7.0 (HH) 0.5 - 1.9 mmol/L  Acetaminophen level  Result Value Ref Range   Acetaminophen (Tylenol), Serum <10 (L) 10 - 30 ug/mL  Salicylate level  Result Value Ref Range   Salicylate Lvl <2.9 2.8 - 30.0 mg/dL  Ammonia  Result Value Ref Range   Ammonia 47 (H) 9 - 35 umol/L  Urinalysis, Routine w reflex microscopic  Result Value Ref Range   Color, Urine AMBER (A) YELLOW   APPearance CLEAR CLEAR   Specific Gravity, Urine 1.020 1.005 - 1.030   pH 7.0 5.0 - 8.0   Glucose, UA NEGATIVE NEGATIVE mg/dL   Hgb urine dipstick MODERATE (A) NEGATIVE   Bilirubin Urine NEGATIVE NEGATIVE   Ketones, ur 5 (A) NEGATIVE mg/dL   Protein, ur 100 (A) NEGATIVE mg/dL   Nitrite NEGATIVE NEGATIVE   Leukocytes,Ua NEGATIVE NEGATIVE   RBC / HPF 0-5 0 - 5 RBC/hpf   WBC, UA 0-5 0 - 5 WBC/hpf   Bacteria, UA NONE SEEN NONE SEEN   Squamous Epithelial / LPF 0-5 0 - 5   Mucus PRESENT   Protime-INR  Result Value Ref Range   Prothrombin Time 12.6 11.4 - 15.2 seconds   INR 1.0 0.8 - 1.2  CBG monitoring, ED  Result Value Ref Range   Glucose-Capillary 116 (H) 70 - 99 mg/dL   Dg Chest Portable 1 View  Result Date: 03/02/2019 CLINICAL DATA:  48 year old male with altered mental status. EXAM: PORTABLE CHEST 1 VIEW COMPARISON:  Chest CTA 06/01/2018 and earlier. FINDINGS: Portable AP semi upright view at 0222 hours. Lung volumes and mediastinal contours are stable and within normal limits. Visualized tracheal air column is within normal limits. Allowing for portable technique the lungs are clear. Chronic right lateral 7th rib fracture. No acute osseous abnormality identified. IMPRESSION: No acute cardiopulmonary abnormality. Electronically Signed    By: Genevie Ann M.D.   On: 03/02/2019 02:42    EKG  EKG Interpretation  Date/Time:  Wednesday March 02 2019 02:13:43 EDT Ventricular Rate:  126 PR Interval:    QRS Duration: 83 QT Interval:  331 QTC Calculation: 480 R Axis:   90 Text Interpretation:  Sinus tachycardia Borderline right axis deviation Probable anteroseptal infarct, old Confirmed by Randal Buba, Cuahutemoc Attar ([id number]) on 03/02/2019 3:26:59 AM       Radiology Dg Chest Portable 1 View  Result Date: 03/02/2019 CLINICAL DATA:  48 year old male with altered mental status. EXAM: PORTABLE CHEST 1 VIEW COMPARISON:  Chest CTA 06/01/2018 and earlier. FINDINGS: Portable AP semi upright view at 0222 hours. Lung volumes and mediastinal contours are stable and within normal limits. Visualized tracheal air column is within normal limits. Allowing for portable technique the lungs are clear. Chronic right lateral 7th rib fracture. No acute osseous abnormality identified. IMPRESSION: No acute cardiopulmonary abnormality. Electronically Signed   By: Genevie Ann M.D.   On: 03/02/2019 02:42    Procedures Procedures (including critical care time)  Medications Ordered in ED Medications  dexmedetomidine (PRECEDEX) 200 MCG/50ML (4 mcg/mL) infusion (0.4 mcg/kg/hr  100 kg Intravenous New Bag/Given 03/02/19 0404)  sodium bicarbonate 150 mEq in dextrose 5 % 1,000 mL infusion (has no administration in time range)  0.9 %  sodium chloride infusion (has no administration in time range)  haloperidol lactate (HALDOL) injection 5 mg (5 mg Intravenous Given 03/02/19 0218)  LORazepam (ATIVAN) injection 1 mg (1 mg Intravenous Given 03/02/19 0230)  sodium chloride 0.9 % 1,000 mL  with thiamine 239 mg, folic acid 1 mg, multivitamins adult 10 mL, magnesium sulfate 2 g infusion ( Intravenous New Bag/Given 03/02/19 0243)   MDM Reviewed: previous chart, nursing note and vitals Interpretation: labs, ECG, x-ray and CT scan (elevated lactate elevated CK rhabdomyolysis, Elevated LFTs in  alcoholic hepatitis pattern negative CT head negative CXR by me ) Total time providing critical care: > 105 minutes. This excludes time spent performing separately reportable procedures and services. Consults: critical care    CRITICAL CARE Performed by: Marisa Hufstetler K Loreen Bankson-Rasch Total critical care time: 120 minutes Critical care time was exclusive of separately billable procedures and treating other patients. Critical care was necessary to treat or prevent imminent or life-threatening deterioration. Critical care was time spent personally by me on the following activities: development of treatment plan with patient and/or surrogate as well as nursing, discussions with consultants, evaluation of patient's response to treatment, examination of patient, obtaining history from patient or surrogate, ordering and performing treatments and interventions, ordering and review of laboratory studies, ordering and review of radiographic studies, pulse oximetry and re-evaluation of patient's condition. Final Clinical Impressions(s) / ED Diagnoses   Admit for ICU for DTs on precedex drip, also in rhabdomyolysis and AKA    Lorry Anastasi, MD 03/02/19 5320

## 2019-03-03 ENCOUNTER — Other Ambulatory Visit: Payer: Self-pay

## 2019-03-03 ENCOUNTER — Inpatient Hospital Stay (HOSPITAL_COMMUNITY): Payer: Medicare Other

## 2019-03-03 LAB — BASIC METABOLIC PANEL
Anion gap: 12 (ref 5–15)
BUN: 9 mg/dL (ref 6–20)
CO2: 23 mmol/L (ref 22–32)
Calcium: 8.4 mg/dL — ABNORMAL LOW (ref 8.9–10.3)
Chloride: 99 mmol/L (ref 98–111)
Creatinine, Ser: 0.78 mg/dL (ref 0.61–1.24)
GFR calc Af Amer: >60 mL/min (ref >60)
GFR calc non Af Amer: >60 mL/min (ref >60)
Glucose, Bld: 91 mg/dL (ref 70–99)
Potassium: 3.1 mmol/L — ABNORMAL LOW (ref 3.5–5.1)
Sodium: 134 mmol/L — ABNORMAL LOW (ref 135–145)

## 2019-03-03 LAB — CBC
HCT: 36.2 % — ABNORMAL LOW (ref 39.0–52.0)
Hemoglobin: 12.9 g/dL — ABNORMAL LOW (ref 13.0–17.0)
MCH: 34.2 pg — ABNORMAL HIGH (ref 26.0–34.0)
MCHC: 35.6 g/dL (ref 30.0–36.0)
MCV: 96 fL (ref 80.0–100.0)
Platelets: 82 10*3/uL — ABNORMAL LOW (ref 150–400)
RBC: 3.77 MIL/uL — ABNORMAL LOW (ref 4.22–5.81)
RDW: 14.7 % (ref 11.5–15.5)
WBC: 4.5 10*3/uL (ref 4.0–10.5)
nRBC: 0 % (ref 0.0–0.2)

## 2019-03-03 LAB — HEPATIC FUNCTION PANEL
ALT: 89 U/L — ABNORMAL HIGH (ref 0–44)
AST: 357 U/L — ABNORMAL HIGH (ref 15–41)
Albumin: 2.8 g/dL — ABNORMAL LOW (ref 3.5–5.0)
Alkaline Phosphatase: 70 U/L (ref 38–126)
Bilirubin, Direct: 0.4 mg/dL — ABNORMAL HIGH (ref 0.0–0.2)
Indirect Bilirubin: 0.9 mg/dL (ref 0.3–0.9)
Total Bilirubin: 1.3 mg/dL — ABNORMAL HIGH (ref 0.3–1.2)
Total Protein: 5.2 g/dL — ABNORMAL LOW (ref 6.5–8.1)

## 2019-03-03 LAB — URINALYSIS, ROUTINE W REFLEX MICROSCOPIC
Bilirubin Urine: NEGATIVE
Glucose, UA: NEGATIVE mg/dL
Ketones, ur: 5 mg/dL — AB
Nitrite: NEGATIVE
Protein, ur: 30 mg/dL — AB
Specific Gravity, Urine: 1.012 (ref 1.005–1.030)
WBC, UA: >50 WBC/hpf — ABNORMAL HIGH (ref 0–5)
pH: 7 (ref 5.0–8.0)

## 2019-03-03 LAB — CK: Total CK: 11332 U/L — ABNORMAL HIGH (ref 49–397)

## 2019-03-03 LAB — PHOSPHORUS: Phosphorus: 2.3 mg/dL — ABNORMAL LOW (ref 2.5–4.6)

## 2019-03-03 LAB — MAGNESIUM: Magnesium: 1.6 mg/dL — ABNORMAL LOW (ref 1.7–2.4)

## 2019-03-03 MED ORDER — POTASSIUM PHOSPHATES 15 MMOLE/5ML IV SOLN
20.0000 meq | Freq: Once | INTRAVENOUS | Status: AC
  Administered 2019-03-03: 20 meq via INTRAVENOUS
  Filled 2019-03-03: qty 4.55

## 2019-03-03 MED ORDER — MAGNESIUM SULFATE 2 GM/50ML IV SOLN
2.0000 g | Freq: Once | INTRAVENOUS | Status: AC
  Administered 2019-03-03: 2 g via INTRAVENOUS
  Filled 2019-03-03: qty 50

## 2019-03-03 MED ORDER — POTASSIUM CHLORIDE CRYS ER 20 MEQ PO TBCR
30.0000 meq | EXTENDED_RELEASE_TABLET | ORAL | Status: AC
  Administered 2019-03-03 (×2): 30 meq via ORAL
  Filled 2019-03-03 (×2): qty 1

## 2019-03-03 MED ORDER — SODIUM CHLORIDE 0.9 % IV SOLN
INTRAVENOUS | Status: DC | PRN
  Administered 2019-03-03: 250 mL via INTRAVENOUS

## 2019-03-03 NOTE — Progress Notes (Signed)
Interim Progress Note   Urine analysis: Recent Labs    03/03/19 0843  COLORURINE YELLOW  APPEARANCEUR CLEAR  LABSPEC 1.012  PHURINE 7.0  GLUCOSEU NEGATIVE  HGBUR LARGE*  BILIRUBINUR NEGATIVE  KETONESUR 5*  PROTEINUR 30*  NITRITE NEGATIVE  LEUKOCYTESUR LARGE*   UA returned with rare bacteriuria. Patient was catheterized on admission. Denies fever and dysuria.   Monitor for symptoms   Tx not indicated   ETOH Withdrawal:  Stable. 3 mg ativan total since 0600.   Transfer to progressive tele   Progressive CIWA protocol    Wilber Oliphant, M.D.  PGY-2  Family Medicine  03/03/2019 2:19 PM

## 2019-03-03 NOTE — Progress Notes (Addendum)
NAME:  [name] MRN:  [id number] DOB: April 08, 1971  LOS:1 ADMISSION DATE: 03/02/19 CONSULTATION DATE: 03/02/19  REFERRING MD: ED, CHIEF COMPLAINT: AMS   Brief History   [name] is a 48 y.o. male with PMHx s/f hypertension, chronic alcohol use, seizures, PTSD, GERD, smoker, gout, depression with anxiety who presented to the ED with suspected alcohol withdrawal. History of present illness   48 year old male admitted for alcohol withdrawal with delirium.  His admission labs were significant for venous acidosis to 7.0, which has normalized.  He was started on Precedex and CIWA protocol.   Past Medical History  HTN, gout, EtOH abuse, GERD, schizoaffective disorder, depression, EtOH seizures.   Significant Hospital Events   03/02/2019: Admitted   Consults:  None   Procedures:  None   Significant Diagnostic Tests:  03/02/2019 CXR -no acute findings 03/02/2019 Ct Head and cervical spine Wo Contrast- no acute abnormality.  Age advanced brain atrophy.   Micro Data:  COVID negative   Antimicrobials:  none   Interim history/subjective:  Patient reports doing well this morning. Off of precedex this morning. Alert and conversational this morning. Ate 100% of breakfast.    Objective    Temp:  [97.2 F (36.2 C)-98.5 F (36.9 C)] 97.2 F (36.2 C) (07/16 0700) Pulse Rate:  [59-142] 118 (07/16 0906) Cardiac Rhythm: Sinus tachycardia (07/16 0800) Resp:  [14-29] 25 (07/16 0906) BP: (83-145)/(48-100) 145/100 (07/16 0906) SpO2:  [93 %-100 %] 98 % (07/16 0906) Weight:  [96.3 kg] 96.3 kg (07/16 0409)  Intake/Output      07/15 0701 - 07/16 0700 07/16 0701 - 07/17 0700   P.O. 222 480   I.V. (mL/kg) 630.1 (6.5) 51 (0.5)   IV Piggyback 1344.7    Total Intake(mL/kg) 2196.8 (22.8) 531 (5.5)   Urine (mL/kg/hr) 4175 (1.8) 285 (1.1)   Stool 0 0   Total Output 4175 285   Net -1978.2 +246        Stool Occurrence 3 x 1 x     Filed Weights   03/02/19 0330 03/02/19 0632 03/03/19 0409   Weight: 100 kg 94 kg 96.3 kg    Physical Exam:  General: Sitting up in chair eating breakfast watching TV.  Alert, no acute distress.  Heart: RRR. No BLEE Lungs: CTAB. No IWOB.  Abdomen: soft, NTND to palpation of all 4 quadrants, unable to appreciate liver margin.  Neuro: No FND. CN grossly intact.  Extremities: mild tremor in hands and voice.    CBC: Recent Labs  Lab 03/02/19 0215 03/03/19 0336  WBC 6.1 4.5  NEUTROABS 4.7  --   HGB 15.2 12.9*  HCT 44.1 36.2*  MCV 100.5* 96.0  PLT 110* 82*   CMP: Recent Labs  Lab 03/02/19 0215 03/02/19 0747 03/02/19 1311 03/02/19 1804 03/03/19 0336  NA 132* 133* 135 134* 134*  K 3.2* 2.6* 2.8* 3.3* 3.1*  CL 89* 96* 97* 98 99  CO2 25 23 26 26 23   GLUCOSE 102* 103* 113* 108* 91  BUN 10 12 10 10 9   CREATININE 1.19 0.78 0.91 0.91 0.78  CALCIUM 9.3 8.4* 8.3* 8.3* 8.4*  MG  --  2.1  --   --  1.6*  PHOS  --  2.2*  --   --  2.3*  ALBUMIN 3.7 3.0*  --   --  2.8*     GFR: Estimated Creatinine Clearance: 138.9 mL/min (by C-G formula based on SCr of 0.78 mg/dL).  Liver Function Tests: Recent Labs  Lab  03/02/19 0215 03/02/19 0747 03/03/19 0336  AST 204* 218* 357*  ALT 71* 63* 89*  ALKPHOS 88 71 70  BILITOT 2.3* 1.7* 1.3*  PROT 6.8 5.5* 5.2*  ALBUMIN 3.7 3.0* 2.8*   Recent Labs  Lab 03/02/19 0223  AMMONIA 47*    Coagulation Profile: Recent Labs  Lab 03/02/19 0215  INR 1.0   Cardiac Enzymes: Recent Labs  Lab 03/02/19 0215 03/02/19 0747 03/03/19 0336  CKTOTAL 5,135* 7,462* 11,332*   CBG: Recent Labs  Lab 03/02/19 0217 03/02/19 0638 03/02/19 1612  GLUCAP 116* 108* 121*   Lipid Profile: Recent Labs    03/02/19 0215  CHOL 159  HDL 53  LDLCALC 84  TRIG 108  CHOLHDL 3.0   Urine analysis: Recent Labs    03/02/19 0235  COLORURINE AMBER*  APPEARANCEUR CLEAR  LABSPEC 1.020  PHURINE 7.0  GLUCOSEU NEGATIVE  HGBUR MODERATE*  BILIRUBINUR NEGATIVE  KETONESUR 5*  PROTEINUR 100*  NITRITE NEGATIVE   LEUKOCYTESUR NEGATIVE     Assessment & Plan:  NEURO #Delirium tremens Has been on Precedex infusion since arrival which was shut off at 0600.  No PRN Ativan.  See orders have been 7-8 overnight.   . Continue thiamine, folate, MVI daily . As patient is off of Precedex infusion, he will likely be able to discharge from ICU today.  Will watch for next few hours and plan to transfer care to try and hospitalist. . Continue CIWA  PSYCH  #Schizoaffective disorder, depression, anxiety history of psychosis Patient reports following at [facility] and requested to restart seroquel 200mg  AM and 600mg  PM and have 150 mg of Zoloft.  I called over to [facility] to confirm his medications.  It appears that patient has not been on Seroquel since 2019.  When asking the patient, he reported that he would like to restart this medication despite not taking it over the last year.  I explained that this is a medication that we would likely not start while he is inpatient and will need to follow-up with his provider.  The pharmacy also noted that patient has not picked up sertraline since April 2020; however, will continue sertraline 75 mg daily which will have to be titrated by outpatient provider.   Continue sertraline 75mg  daily.   CARDS #Hypertension (83-145)/(48-100) 145/100 (07/16 0906).  At home, patient is on 12.5 mg metoprolol twice daily and amlodipine 10 mg daily. . Continue Lopressor 25 mg twice daily  RENAL #Hypokalemia Patient has been persistently hypokalemic since admission.  Magnesium low at 1.6.   . Replete mag, phos, potassium  #Elevated CK CK remains elevated at 11,332 this morning.  This is increased from [place] [facility] on admission. Due to suspected rhabdomyolysis   Continue to trend   Continue LR 200  GI # Hx of NASH AST/ALT 357/89 likely due to ETOH abuse (is not chronically elevated). INR wnl. Platelets decreased to 82. Mild scleral icterus.  . Continue to trend   GERD  Continue  Protonix   HEME #Macrocytic anemia Likely secondary to alcoholism.  Acute decrease from 15.2-12.9 today which is likely iatrogenic. [name] Continue thiamine, folate, MVI  #Thrombocytopenia Likely secondary to poor hepatic function from EtOH cirrhosis.  #Gout At home takes 200 mg allopurinol daily.  Continue medication  #INTEGUMENTARY  # Ulcerative lesion Ulcer like lesion on upper right arm. Pt reports that he has had these randomly all over his body recently.   Best practice:  Diet: Carb Mod. Pain/Anxiety/Delirium protocol (if indicated): N/A. VAP protocol (  if indicated): N/A. DVT prophylaxis: SCD's / Heparin. GI prophylaxis: PPI.[name] Glucose control: N/A. Mobility: Bedrest. Code Status: ICU. Family Communication: None available. Disposition: Floor .        Wilber Oliphant, M.D.  PGY-2  Family Medicine  03/03/2019 7:51 AM

## 2019-03-03 NOTE — Plan of Care (Signed)
  Problem: Clinical Measurements: Goal: Ability to maintain clinical measurements within normal limits will improve Outcome: Progressing   Problem: Activity: Goal: Risk for activity intolerance will decrease Outcome: Progressing Note: Although pt required stand by assist with ambulation, he was able to go from bed to chair without difficulty.   Problem: Nutrition: Goal: Adequate nutrition will be maintained 03/03/2019 0828 by Netta Corrigan, RN Outcome: Progressing Note: Pt ate 100% of his breakfast this morning. Tolerated well. 03/03/2019 0828 by Netta Corrigan, RN Outcome: Progressing   Problem: Safety: Goal: Ability to remain free from injury will improve 03/03/2019 0828 by Netta Corrigan, RN Outcome: Progressing Note: Pt has been able to listen to directions regarding not getting up on his own. Has remained free from injury. 03/03/2019 0828 by Netta Corrigan, RN Outcome: Progressing

## 2019-03-04 DIAGNOSIS — M6282 Rhabdomyolysis: Secondary | ICD-10-CM

## 2019-03-04 DIAGNOSIS — D696 Thrombocytopenia, unspecified: Secondary | ICD-10-CM

## 2019-03-04 LAB — BASIC METABOLIC PANEL
Anion gap: 10 (ref 5–15)
BUN: 8 mg/dL (ref 6–20)
CO2: 23 mmol/L (ref 22–32)
Calcium: 8.6 mg/dL — ABNORMAL LOW (ref 8.9–10.3)
Chloride: 105 mmol/L (ref 98–111)
Creatinine, Ser: 0.62 mg/dL (ref 0.61–1.24)
GFR calc Af Amer: >60 mL/min (ref >60)
GFR calc non Af Amer: >60 mL/min (ref >60)
Glucose, Bld: 100 mg/dL — ABNORMAL HIGH (ref 70–99)
Potassium: 3.7 mmol/L (ref 3.5–5.1)
Sodium: 138 mmol/L (ref 135–145)

## 2019-03-04 LAB — HEPATIC FUNCTION PANEL
ALT: 147 U/L — ABNORMAL HIGH (ref 0–44)
AST: 460 U/L — ABNORMAL HIGH (ref 15–41)
Albumin: 2.8 g/dL — ABNORMAL LOW (ref 3.5–5.0)
Alkaline Phosphatase: 84 U/L (ref 38–126)
Bilirubin, Direct: 0.5 mg/dL — ABNORMAL HIGH (ref 0.0–0.2)
Indirect Bilirubin: 1.1 mg/dL — ABNORMAL HIGH (ref 0.3–0.9)
Total Bilirubin: 1.6 mg/dL — ABNORMAL HIGH (ref 0.3–1.2)
Total Protein: 5.6 g/dL — ABNORMAL LOW (ref 6.5–8.1)

## 2019-03-04 LAB — CK: Total CK: 16067 U/L — ABNORMAL HIGH (ref 49–397)

## 2019-03-04 LAB — CBC
HCT: 36.9 % — ABNORMAL LOW (ref 39.0–52.0)
Hemoglobin: 12.7 g/dL — ABNORMAL LOW (ref 13.0–17.0)
MCH: 33.7 pg (ref 26.0–34.0)
MCHC: 34.4 g/dL (ref 30.0–36.0)
MCV: 97.9 fL (ref 80.0–100.0)
Platelets: 85 10*3/uL — ABNORMAL LOW (ref 150–400)
RBC: 3.77 MIL/uL — ABNORMAL LOW (ref 4.22–5.81)
RDW: 14.8 % (ref 11.5–15.5)
WBC: 3.8 10*3/uL — ABNORMAL LOW (ref 4.0–10.5)
nRBC: 0 % (ref 0.0–0.2)

## 2019-03-04 LAB — GLUCOSE, CAPILLARY: Glucose-Capillary: 107 mg/dL — ABNORMAL HIGH (ref 70–99)

## 2019-03-04 LAB — PHOSPHORUS: Phosphorus: 4.3 mg/dL (ref 2.5–4.6)

## 2019-03-04 LAB — MAGNESIUM: Magnesium: 2 mg/dL (ref 1.7–2.4)

## 2019-03-04 MED ORDER — NICOTINE 21 MG/24HR TD PT24
21.0000 mg | MEDICATED_PATCH | Freq: Every day | TRANSDERMAL | Status: DC
  Administered 2019-03-04 – 2019-03-08 (×6): 21 mg via TRANSDERMAL
  Filled 2019-03-04 (×6): qty 1

## 2019-03-04 MED ORDER — LORAZEPAM 2 MG/ML IJ SOLN
2.0000 mg | INTRAMUSCULAR | Status: DC | PRN
  Administered 2019-03-05 (×2): 2 mg via INTRAVENOUS
  Administered 2019-03-05: 3 mg via INTRAVENOUS
  Administered 2019-03-05 – 2019-03-06 (×3): 2 mg via INTRAVENOUS
  Filled 2019-03-04 (×2): qty 1
  Filled 2019-03-04: qty 2
  Filled 2019-03-04 (×3): qty 1
  Filled 2019-03-04: qty 2

## 2019-03-04 NOTE — Progress Notes (Addendum)
Transferred from PCCM was held due to patient requiring precedex infusion, will assume care in am.

## 2019-03-04 NOTE — Progress Notes (Signed)
NAME:  [name] MRN:  [id number] DOB: [birth year]  LOS:1 ADMISSION DATE: 03/02/19 CONSULTATION DATE: 03/02/19  REFERRING MD: ED, CHIEF COMPLAINT: AMS   Brief History   [name] is a 48 y.o. male with PMHx s/f hypertension, chronic alcohol use, seizures, PTSD, GERD, smoker, gout, depression with anxiety who presented to the ED with suspected alcohol withdrawal. History of present illness   48 year old male admitted for alcohol withdrawal with delirium.  His admission labs were significant for venous acidosis to 7.0, which has normalized.  He was started on Precedex and CIWA protocol.   Past Medical History  HTN, gout, EtOH abuse, GERD, schizoaffective disorder, depression, EtOH seizures.   Significant Hospital Events   03/02/2019: Admitted   Consults:  None   Procedures:  None   Significant Diagnostic Tests:  03/02/2019 CXR -no acute findings 03/02/2019 Ct Head and cervical spine Wo Contrast- no acute abnormality.  Age advanced brain atrophy.   Micro Data:  COVID negative   Antimicrobials:  none   Interim history/subjective:  [name] reports doing well this morning. Off of precedex this morning. Alert and conversational this morning. Ate 100% of breakfast.    Objective    Temp:  [97.9 F (36.6 C)-98.6 F (37 C)] 97.9 F (36.6 C) (07/17 0812) Pulse Rate:  [52-256] 62 (07/17 0700) Cardiac Rhythm: Sinus bradycardia (07/17 0400) Resp:  [19-39] 23 (07/17 0700) BP: (106-167)/(65-125) 167/112 (07/17 0700) SpO2:  [61 %-98 %] 96 % (07/17 0700) Weight:  [96.4 kg] 96.4 kg (07/17 0500)  Intake/Output      07/16 0701 - 07/17 0700 07/17 0701 - 07/18 0700   P.O. 1210    I.V. (mL/kg) 4736 (49.1)    IV Piggyback 305.3    Total Intake(mL/kg) 6251.3 (64.8)    Urine (mL/kg/hr) 2435 (1.1)    Stool 0    Total Output 2435    Net +3816.3         Urine Occurrence 1 x    Stool Occurrence 4 x      Filed Weights   03/02/19 0632 03/03/19 0409 03/04/19 0500  Weight: 94 kg  96.3 kg 96.4 kg    Physical Exam:  General: Sitting up in chair eating breakfast watching TV.  Alert, no acute distress.  Heart: RRR. No BLEE Lungs: CTAB. No IWOB.  Abdomen: soft, NTND to palpation of all 4 quadrants, unable to appreciate liver margin.  Neuro: A&O x 4 No FND. CN grossly intact.  Extremities: warm and well perfused.    CBC: Recent Labs  Lab 03/02/19 0215 03/03/19 0336 03/04/19 0332  WBC 6.1 4.5 3.8*  NEUTROABS 4.7  --   --   HGB 15.2 12.9* 12.7*  HCT 44.1 36.2* 36.9*  MCV 100.5* 96.0 97.9  PLT 110* 82* 85*     CMP: Recent Labs  Lab 03/02/19 0215 03/02/19 0747 03/02/19 1311 03/02/19 1804 03/03/19 0336 03/04/19 0332  NA 132* 133* 135 134* 134* 138  K 3.2* 2.6* 2.8* 3.3* 3.1* 3.7  CL 89* 96* 97* 98 99 105  CO2 25 23 26 26 23 23   GLUCOSE 102* 103* 113* 108* 91 100*  BUN 10 12 10 10 9 8   CREATININE 1.19 0.78 0.91 0.91 0.78 0.62  CALCIUM 9.3 8.4* 8.3* 8.3* 8.4* 8.6*  MG  --  2.1  --   --  1.6* 2.0  PHOS  --  2.2*  --   --  2.3* 4.3  ALBUMIN 3.7 3.0*  --   --  2.8* 2.8*     GFR: Estimated Creatinine Clearance: 138.9 mL/min (by C-G formula based on SCr of 0.62 mg/dL).  Liver Function Tests: Recent Labs  Lab 03/02/19 0215 03/02/19 0747 03/03/19 0336 03/04/19 0332  AST 204* 218* 357* 460*  ALT 71* 63* 89* 147*  ALKPHOS 88 71 70 84  BILITOT 2.3* 1.7* 1.3* 1.6*  PROT 6.8 5.5* 5.2* 5.6*  ALBUMIN 3.7 3.0* 2.8* 2.8*   Recent Labs  Lab 03/02/19 0223  AMMONIA 47*    Coagulation Profile: Recent Labs  Lab 03/02/19 0215  INR 1.0   Cardiac Enzymes: Recent Labs  Lab 03/02/19 0215 03/02/19 0747 03/03/19 0336 03/04/19 0332  CKTOTAL 5,135* 7,462* 11,332* 16,067*   CBG: Recent Labs  Lab 03/02/19 0217 03/02/19 0638 03/02/19 1612 03/04/19 0818  GLUCAP 116* 108* 121* 107*   Lipid Profile: Recent Labs    03/02/19 0215  CHOL 159  HDL 53  LDLCALC 84  TRIG 108  CHOLHDL 3.0   Urine analysis: Recent Labs    03/03/19 0843   COLORURINE YELLOW  APPEARANCEUR CLEAR  LABSPEC 1.012  PHURINE 7.0  GLUCOSEU NEGATIVE  HGBUR LARGE*  BILIRUBINUR NEGATIVE  KETONESUR 5*  PROTEINUR 30*  NITRITE NEGATIVE  LEUKOCYTESUR LARGE*     Assessment & Plan:   #Delirium tremens precedex turned off at 0600 7/16 but restarted around 1700 and pt remained in ICU. D/c'ed precedex today and will plan to transfer to floor. [facility] to take [name]  . Continue thiamine, folate, MVI daily . Will dc from ICU today.  . [facility] team to pick up at 0700 7/18  #Schizoaffective disorder, depression, anxiety history of psychosis [name] reports following at [facility] and requested to restart seroquel 200mg  AM and 600mg  PM and have 150 mg of Zoloft.  I called over to [facility] to confirm his medications.  It appears that [name] has not been on Seroquel since 2019.  When asking the [name], he reported that he would like to restart this medication despite not taking it over the last year. If needed, can consider restarting seroquel while in hospital, will leave [facility]. [name] has borderline QTc and should be   Continue sertraline 75mg  daily.    #Hypertension (83-145)/(48-100) 145/100 (07/16 0906).  At home, [name] is on 12.5 mg metoprolol twice daily and amlodipine 10 mg daily. . Continue Lopressor 12.5 mg twice daily  # Rhabdomyolysis CK remains elevated at 11,332 >> 16067.  7462 on admission.   Continue to trend   Continue LR 200  # Hx of NASH AST/ALT 357/89 > 460/147, Tbili 1.6 (D0.5/I 1.1 likely due to ETOH abuse (is not chronically elevated). INR wnl. Platelets decreased to 82. Mild scleral icterus.  . Continue to trend   GERD  Continue Protonix   Macrocytic anemia Likely secondary to alcoholism.  Stable at 12.7 (12.9 yesterday)likely iatrogenic. [name] Continue thiamine, folate, MVI  Thrombocytopenia Likely secondary to poor hepatic function from EtOH cirrhosis.  D/c heparin, SVDS and ambulation  Gout At home takes 200 mg  allopurinol daily.  Continue medication  Ulcerative lesion  Ulcer like lesion on upper right arm. Pt reports that he has had these randomly all over his body recently.   Best practice:  Diet: Carb Mod. Pain/Anxiety/Delirium protocol (if indicated): N/A. VAP protocol (if indicated): N/A. DVT prophylaxis: SCD's / Heparin. GI prophylaxis: PPI  Glucose control: N/A. Mobility: OOB w/ assistance Code Status: Full Family Communication: None available. Disposition: Floor         Wilber Oliphant,  M.D.  PGY-2  Family Medicine  03/04/2019 8:29 AM

## 2019-03-05 MED ORDER — CLONAZEPAM 0.5 MG PO TABS
1.0000 mg | ORAL_TABLET | Freq: Two times a day (BID) | ORAL | Status: DC
  Administered 2019-03-05 – 2019-03-06 (×4): 1 mg via ORAL
  Filled 2019-03-05 (×4): qty 2

## 2019-03-05 MED ORDER — HALOPERIDOL 2 MG PO TABS
2.0000 mg | ORAL_TABLET | ORAL | Status: DC | PRN
  Filled 2019-03-05: qty 1

## 2019-03-05 MED ORDER — HALOPERIDOL 1 MG PO TABS
1.0000 mg | ORAL_TABLET | ORAL | Status: DC | PRN
  Filled 2019-03-05: qty 1

## 2019-03-05 MED ORDER — DEXTROSE-NACL 5-0.9 % IV SOLN
INTRAVENOUS | Status: DC
  Administered 2019-03-05 – 2019-03-08 (×7): via INTRAVENOUS

## 2019-03-05 MED ORDER — HALOPERIDOL LACTATE 5 MG/ML IJ SOLN
1.0000 mg | INTRAMUSCULAR | Status: DC | PRN
  Filled 2019-03-05: qty 1

## 2019-03-05 MED ORDER — HALOPERIDOL LACTATE 5 MG/ML IJ SOLN
2.0000 mg | INTRAMUSCULAR | Status: DC | PRN
  Administered 2019-03-05: 2 mg via INTRAMUSCULAR

## 2019-03-05 NOTE — Progress Notes (Signed)
Throughout the shift (7a-7p) patient has been disorganized, restless, forgetful, and confused, while he is oriented to person, place, and time most of the time.  He has no appreciation of the situation.  Pulling on lines and tubes, incontinent of urine, trying to get out of bed. Frequent CIWA assessments done and ativan given as appropriate.  Contacted MD for additional pharmacological assist and received orders for Haldol and clonazepam, which were given with mild affect.  Haldol dose increased.  Patient states that nicotine patches give him bad dreams. Tele sitter ordered and requested several hours ago but not yet delivered to room.

## 2019-03-05 NOTE — Progress Notes (Signed)
PROGRESS NOTE    [name]  MVE:[id number] DOB: November 06, 1970 DOA: 03/02/2019 PCP: Scot Jun, FNP    Brief Narrative:  48 year old male who presented with altered mental status.  He does have a significant past medical history for alcohol abuse, schizoaffective disorder, depression and alcohol induced seizures.  Apparently he stopped drinking 3 to 4 days prior to hospitalization, he developed acute collapse, questionable loss of consciousness.  EMS was called and found him under the coffee table and agitated.  On his initial physical examination blood pressure 126/82, heart rate 118, temperature 99.6, respiratory rate 16.  Her lungs were clear to auscultation bilaterally, heart is also present rhythmic, abdomen soft, no extremity edema.  Patient was agitated, on four-point restraints and placed on dexmedetomidine infusion.  Sodium 132, potassium 3.2, chloride 89, bicarb 25, glucose 102, BUN 10, creatinine 1.9, CK 5,135, white count 6.1, hemoglobin 15.2, hematocrit 44.1, platelets 110, SARS COVID-19 negative, urinalysis more than 50 white cells, 11-20 red cells.  Alcohol less than 10, positive for tetrahydrocannabinol.  Head and cervical CT with no acute changes.  Chest radiograph with no infiltrates.  EKG 126 bpm, normal axis, normal intervals, no ST segment or T wave changes.  Patient was admitted to the hospital working diagnosis acute alcohol withdrawal syndrome, complicated by acute kidney injury and rhabdomyolysis.  Assessment & Plan:   Active Problems:   Alcohol abuse   Alcohol withdrawal ([place])   1. Acute alcohol withdrawal. Patient continue to be very agitated and disorientated, positive tremors. On lorazepam as needed per CIWA protocol, has received 7 mg lorazepam today. Will add clonazepam bid and as needed haldol. Continue neuro checks.   2. Rhabdomyolysis. Worsening CK today at 16,067, renal function with serum cr at 0,62 with K at 3,7 and serum bicarbonate at 23. Will  resume IV fluids with saline at 120 ml per H and will follow on renal panel in am. Trend CK.   3. Acute alcoholic hepatitis. No significant abdominal pain, worsening liver enzymes with AST at 460, ALT 147 and T BIL 1,6. No frank signs of encephalopathy and INR at 1,0 on admission. Will continue supportive medical therapy with IV fluids, avoid hepatotoxic medications, follow LFT and INR in am.   4. HTN. Continue blood pressure control with metoprolol.   DVT prophylaxis: enoxaparin   Code Status: full Family Communication: no family at the bedside  Disposition Plan/ discharge barriers: pending clinical improvement.   Body mass index is 28.64 kg/m. Malnutrition Type:  Nutrition Problem: Inadequate oral intake Etiology: social / environmental circumstances, decreased appetite   Malnutrition Characteristics:  Signs/Symptoms: per patient/family report   Nutrition Interventions:  Interventions: Ensure Enlive (each supplement provides 350kcal and 20 grams of protein), MVI, Liberalize Diet  RN Pressure Injury Documentation:     Consultants:     Procedures:     Antimicrobials:       Subjective: Patient continue to be confused and disorientated, follows commands and is easy to redirected, no nausea or vomiting, no chest pain.   Objective: Vitals:   03/05/19 0547 03/05/19 0840 03/05/19 1100 03/05/19 1210  BP: (!) 136/91 (!) 142/99 (!) 151/102 (!) 139/107  Pulse: 90 68 (!) 104   Resp: 20 20    Temp: 99.1 F (37.3 C) 98.5 F (36.9 C) 98.5 F (36.9 C)   TempSrc: Oral Oral    SpO2: 96% 93% 95%   Weight: 95.8 kg     Height:        Intake/Output Summary (Last  24 hours) at 03/05/2019 1323 Last data filed at 03/05/2019 1242 Gross per 24 hour  Intake 2105.44 ml  Output 3370 ml  Net -1264.56 ml   Filed Weights   03/03/19 0409 03/04/19 0500 03/05/19 0547  Weight: 96.3 kg 96.4 kg 95.8 kg    Examination:   General: deconditioned and ill looking appearing   Neurology: Awake, positive tremors and anxiety, he is confused but easy to be redirected.   E ENT: mild pallor, no icterus, oral mucosa moist Cardiovascular: No JVD. S1-S2 present, rhythmic, no gallops, rubs, or murmurs. No lower extremity edema. Pulmonary: positive breath sounds bilaterally, adequate air movement, no wheezing, rhonchi or rales. Gastrointestinal. Abdomen with, no organomegaly, non tender, no rebound or guarding Skin. No rashes Musculoskeletal: no joint deformities     Data Reviewed: I have personally reviewed following labs and imaging studies  CBC: Recent Labs  Lab 03/02/19 0215 03/03/19 0336 03/04/19 0332  WBC 6.1 4.5 3.8*  NEUTROABS 4.7  --   --   HGB 15.2 12.9* 12.7*  HCT 44.1 36.2* 36.9*  MCV 100.5* 96.0 97.9  PLT 110* 82* 85*   Basic Metabolic Panel: Recent Labs  Lab 03/02/19 0747 03/02/19 1311 03/02/19 1804 03/03/19 0336 03/04/19 0332  NA 133* 135 134* 134* 138  K 2.6* 2.8* 3.3* 3.1* 3.7  CL 96* 97* 98 99 105  CO2 23 26 26 23 23   GLUCOSE 103* 113* 108* 91 100*  BUN 12 10 10 9 8   CREATININE 0.78 0.91 0.91 0.78 0.62  CALCIUM 8.4* 8.3* 8.3* 8.4* 8.6*  MG 2.1  --   --  1.6* 2.0  PHOS 2.2*  --   --  2.3* 4.3   GFR: Estimated Creatinine Clearance: 138.6 mL/min (by C-G formula based on SCr of 0.62 mg/dL). Liver Function Tests: Recent Labs  Lab 03/02/19 0215 03/02/19 0747 03/03/19 0336 03/04/19 0332  AST 204* 218* 357* 460*  ALT 71* 63* 89* 147*  ALKPHOS 88 71 70 84  BILITOT 2.3* 1.7* 1.3* 1.6*  PROT 6.8 5.5* 5.2* 5.6*  ALBUMIN 3.7 3.0* 2.8* 2.8*   No results for input(s): LIPASE, AMYLASE in the last 168 hours. Recent Labs  Lab 03/02/19 0223  AMMONIA 47*   Coagulation Profile: Recent Labs  Lab 03/02/19 0215  INR 1.0   Cardiac Enzymes: Recent Labs  Lab 03/02/19 0215 03/02/19 0747 03/03/19 0336 03/04/19 0332  CKTOTAL 5,135* 7,462* 11,332* 16,067*   BNP (last 3 results) No results for input(s): PROBNP in the last 8760  hours. HbA1C: No results for input(s): HGBA1C in the last 72 hours. CBG: Recent Labs  Lab 03/02/19 0217 03/02/19 0638 03/02/19 1612 03/04/19 0818  GLUCAP 116* 108* 121* 107*   Lipid Profile: No results for input(s): CHOL, HDL, LDLCALC, TRIG, CHOLHDL, LDLDIRECT in the last 72 hours. Thyroid Function Tests: No results for input(s): TSH, T4TOTAL, FREET4, T3FREE, THYROIDAB in the last 72 hours. Anemia Panel: No results for input(s): VITAMINB12, FOLATE, FERRITIN, TIBC, IRON, RETICCTPCT in the last 72 hours.    Radiology Studies: I have reviewed all of the imaging during this hospital visit personally     Scheduled Meds: . allopurinol  200 mg Oral Daily  . feeding supplement (ENSURE ENLIVE)  237 mL Oral BID BM  . folic acid  1 mg Oral Daily  . metoprolol tartrate  12.5 mg Oral BID  . multivitamin with minerals  1 tablet Oral Daily  . nicotine  21 mg Transdermal Daily  . pantoprazole  40 mg Oral  Q1200  . sertraline  75 mg Oral Daily  . thiamine  100 mg Oral Daily   Continuous Infusions: . sodium chloride Stopped (03/03/19 1629)     LOS: 3 days        Yovani Cogburn Gerome Apley, MD

## 2019-03-06 DIAGNOSIS — K7010 Alcoholic hepatitis without ascites: Secondary | ICD-10-CM

## 2019-03-06 DIAGNOSIS — M6282 Rhabdomyolysis: Secondary | ICD-10-CM

## 2019-03-06 DIAGNOSIS — F251 Schizoaffective disorder, depressive type: Secondary | ICD-10-CM

## 2019-03-06 LAB — HEPATIC FUNCTION PANEL
ALT: 210 U/L — ABNORMAL HIGH (ref 0–44)
AST: 266 U/L — ABNORMAL HIGH (ref 15–41)
Albumin: 2.8 g/dL — ABNORMAL LOW (ref 3.5–5.0)
Alkaline Phosphatase: 94 U/L (ref 38–126)
Bilirubin, Direct: 0.4 mg/dL — ABNORMAL HIGH (ref 0.0–0.2)
Indirect Bilirubin: 0.9 mg/dL (ref 0.3–0.9)
Total Bilirubin: 1.3 mg/dL — ABNORMAL HIGH (ref 0.3–1.2)
Total Protein: 5.5 g/dL — ABNORMAL LOW (ref 6.5–8.1)

## 2019-03-06 LAB — BASIC METABOLIC PANEL
Anion gap: 10 (ref 5–15)
BUN: 10 mg/dL (ref 6–20)
CO2: 23 mmol/L (ref 22–32)
Calcium: 8.7 mg/dL — ABNORMAL LOW (ref 8.9–10.3)
Chloride: 104 mmol/L (ref 98–111)
Creatinine, Ser: 0.69 mg/dL (ref 0.61–1.24)
GFR calc Af Amer: >60 mL/min (ref >60)
GFR calc non Af Amer: >60 mL/min (ref >60)
Glucose, Bld: 92 mg/dL (ref 70–99)
Potassium: 3.4 mmol/L — ABNORMAL LOW (ref 3.5–5.1)
Sodium: 137 mmol/L (ref 135–145)

## 2019-03-06 LAB — CK: Total CK: 4600 U/L — ABNORMAL HIGH (ref 49–397)

## 2019-03-06 LAB — PROTIME-INR
INR: 1 (ref 0.8–1.2)
Prothrombin Time: 12.7 seconds (ref 11.4–15.2)

## 2019-03-06 MED ORDER — ACETAMINOPHEN 500 MG PO TABS: 500.0000 mg | ORAL_TABLET | Freq: Four times a day (QID) | ORAL | Status: DC | PRN

## 2019-03-06 MED ORDER — POTASSIUM CHLORIDE CRYS ER 20 MEQ PO TBCR
40.0000 meq | EXTENDED_RELEASE_TABLET | Freq: Once | ORAL | Status: AC
  Administered 2019-03-06: 40 meq via ORAL
  Filled 2019-03-06: qty 2

## 2019-03-06 NOTE — Progress Notes (Signed)
PROGRESS NOTE    Kethan Papadopoulos  DQQ:[id number] DOB: [birth year] DOA: 03/02/2019 PCP: Scot Jun, FNP    Brief Narrative:  48 year old male who presented with altered mental status.  He does have a significant past medical history for alcohol abuse, schizoaffective disorder, depression and alcohol induced seizures.  Apparently he stopped drinking 3 to 4 days prior to hospitalization, he developed acute collapse, questionable loss of consciousness.  EMS was called and found him under the coffee table and agitated.  On his initial physical examination blood pressure 126/82, heart rate 118, temperature 99.6, respiratory rate 16.  His lungs were clear to auscultation bilaterally, heart S1-S2 present rhythmic, abdomen soft, no lower extremity edema.  Patient was agitated, on four-point restraints and placed on dexmedetomidine infusion.  Sodium 132, potassium 3.2, chloride 89, bicarb 25, glucose 102, BUN 10, creatinine 1.9, CK 5,135, white count 6.1, hemoglobin 15.2, hematocrit 44.1, platelets 110, SARS COVID-19 negative, urinalysis more than 50 white cells, 11-20 red cells.  Alcohol less than 10, positive for tetrahydrocannabinol.  Head and cervical CT with no acute changes.  Chest radiograph with no infiltrates.  EKG 126 bpm, normal axis, normal intervals, no ST segment or T wave changes.  Patient was admitted to the hospital working diagnosis acute alcohol withdrawal syndrome, complicated by acute kidney injury and rhabdomyolysis.   Assessment & Plan:   Principal Problem:   Alcohol withdrawal ([place]) Active Problems:   Alcohol abuse   Schizoaffective disorder, depressive type ([place])   Rhabdomyolysis   Alcoholic hepatitis without ascites   1. Acute alcohol withdrawal. Clinically this am patient is more calm, positive tremors, improved but not yet back to baseline. He is tolerating po well, no nausea or vomiting. This am at 9:00 am had 2 mg of lorazepam. Tolerating well clonazepam, has not  required haldol so far today. Will continue neuro checks per CIWA protocol. Discontinue telemetry.   2. Rhabdomyolysis with hypokalemia. Renal function has remained stable, K at 3,4 with serum bicarbonate at 23, CK down to 4,600 from 16,067, no clinical signs of volume overload. Urine output over last 24 H is 1,600 ml. Will continue high rate of isotonic saline IV and follow up renal panel and Ck in am. Correct K with Kcl, follow Mg in am.   3. Acute alcoholic hepatitis. AST down to 266 and ALT up to 210, T Bilirubins down to 1,3 from 1,6. No signs of hepatic encephalopathy, INR stable at 1,0. Hepatitis panel from 12/2018 negative for Hep A, B and C. Abdominal US from 2016 with hepatic steatosis. Will continue to follow LFT in am.  4. HTN. Metoprolol for blood pressure control.    5. Schizoaffective disorder. Continue with sertraline and thiamine.   DVT prophylaxis: enoxaparin   Code Status: full Family Communication: no family at the bedside  Disposition Plan/ discharge barriers: Transfer to medical ward.   Body mass index is 28.58 kg/m. Malnutrition Type:  Nutrition Problem: Inadequate oral intake Etiology: social / environmental circumstances, decreased appetite   Malnutrition Characteristics:  Signs/Symptoms: per patient/family report   Nutrition Interventions:  Interventions: Ensure Enlive (each supplement provides 350kcal and 20 grams of protein), MVI, Liberalize Diet  RN Pressure Injury Documentation:     Consultants:     Procedures:     Antimicrobials:       Subjective: Patient continue to have tremors, improved but not yet back to baseline, no nausea or vomiting, no dyspnea or abdominal pain. Per nursing patient has been more calm.   Objective:  Vitals:   03/06/19 0000 03/06/19 0400 03/06/19 0626 03/06/19 0800  BP: 134/85 125/83 128/78 (!) 134/105  Pulse: 85  92 (!) 107  Resp:      Temp:   98.9 F (37.2 C)   TempSrc:   Oral   SpO2:   97%     Weight:   95.6 kg   Height:        Intake/Output Summary (Last 24 hours) at 03/06/2019 1335 Last data filed at 03/06/2019 0630 Gross per 24 hour  Intake 1567.79 ml  Output 825 ml  Net 742.79 ml   Filed Weights   03/04/19 0500 03/05/19 0547 03/06/19 0626  Weight: 96.4 kg 95.8 kg 95.6 kg    Examination:   General: deconditioned  Neurology: Awake and alert, non focal/ positive resting tremors, no asterixis.   E ENT: no pallor, no icterus, oral mucosa moist Cardiovascular: No JVD. S1-S2 present, rhythmic, no gallops, rubs, or murmurs. No lower extremity edema. Pulmonary: positive breath sounds bilaterally, adequate air movement, no wheezing, rhonchi or rales. Gastrointestinal. Abdomen with  no organomegaly, non tender, no rebound or guarding Skin. No rashes Musculoskeletal: no joint deformities     Data Reviewed: I have personally reviewed following labs and imaging studies  CBC: Recent Labs  Lab 03/02/19 0215 03/03/19 0336 03/04/19 0332  WBC 6.1 4.5 3.8*  NEUTROABS 4.7  --   --   HGB 15.2 12.9* 12.7*  HCT 44.1 36.2* 36.9*  MCV 100.5* 96.0 97.9  PLT 110* 82* 85*   Basic Metabolic Panel: Recent Labs  Lab 03/02/19 0747 03/02/19 1311 03/02/19 1804 03/03/19 0336 03/04/19 0332 03/06/19 0728  NA 133* 135 134* 134* 138 137  K 2.6* 2.8* 3.3* 3.1* 3.7 3.4*  CL 96* 97* 98 99 105 104  CO2 23 26 26 23 23 23   GLUCOSE 103* 113* 108* 91 100* 92  BUN 12 10 10 9 8 10   CREATININE 0.78 0.91 0.91 0.78 0.62 0.69  CALCIUM 8.4* 8.3* 8.3* 8.4* 8.6* 8.7*  MG 2.1  --   --  1.6* 2.0  --   PHOS 2.2*  --   --  2.3* 4.3  --    GFR: Estimated Creatinine Clearance: 138.4 mL/min (by C-G formula based on SCr of 0.69 mg/dL). Liver Function Tests: Recent Labs  Lab 03/02/19 0215 03/02/19 0747 03/03/19 0336 03/04/19 0332 03/06/19 0728  AST 204* 218* 357* 460* 266*  ALT 71* 63* 89* 147* 210*  ALKPHOS 88 71 70 84 94  BILITOT 2.3* 1.7* 1.3* 1.6* 1.3*  PROT 6.8 5.5* 5.2* 5.6* 5.5*   ALBUMIN 3.7 3.0* 2.8* 2.8* 2.8*   No results for input(s): LIPASE, AMYLASE in the last 168 hours. Recent Labs  Lab 03/02/19 0223  AMMONIA 47*   Coagulation Profile: Recent Labs  Lab 03/02/19 0215 03/06/19 0728  INR 1.0 1.0   Cardiac Enzymes: Recent Labs  Lab 03/02/19 0215 03/02/19 0747 03/03/19 0336 03/04/19 0332 03/06/19 0728  CKTOTAL 5,135* 7,462* 11,332* 16,067* 4,600*   BNP (last 3 results) No results for input(s): PROBNP in the last 8760 hours. HbA1C: No results for input(s): HGBA1C in the last 72 hours. CBG: Recent Labs  Lab 03/02/19 0217 03/02/19 0638 03/02/19 1612 03/04/19 0818  GLUCAP 116* 108* 121* 107*   Lipid Profile: No results for input(s): CHOL, HDL, LDLCALC, TRIG, CHOLHDL, LDLDIRECT in the last 72 hours. Thyroid Function Tests: No results for input(s): TSH, T4TOTAL, FREET4, T3FREE, THYROIDAB in the last 72 hours. Anemia Panel: No results for input(s):  VITAMINB12, FOLATE, FERRITIN, TIBC, IRON, RETICCTPCT in the last 72 hours.    Radiology Studies: I have reviewed all of the imaging during this hospital visit personally     Scheduled Meds:  allopurinol  200 mg Oral Daily   clonazePAM  1 mg Oral BID   feeding supplement (ENSURE ENLIVE)  237 mL Oral BID BM   folic acid  1 mg Oral Daily   metoprolol tartrate  12.5 mg Oral BID   multivitamin with minerals  1 tablet Oral Daily   nicotine  21 mg Transdermal Daily   pantoprazole  40 mg Oral Q1200   sertraline  75 mg Oral Daily   thiamine  100 mg Oral Daily   Continuous Infusions:  sodium chloride Stopped (03/03/19 1629)   dextrose 5 % and 0.9% NaCl 125 mL/hr at 03/06/19 0500     LOS: 4 days        Carsyn Boster Gerome Apley, MD

## 2019-03-07 DIAGNOSIS — F1910 Other psychoactive substance abuse, uncomplicated: Secondary | ICD-10-CM

## 2019-03-07 LAB — BASIC METABOLIC PANEL
Anion gap: 7 (ref 5–15)
BUN: 12 mg/dL (ref 6–20)
CO2: 23 mmol/L (ref 22–32)
Calcium: 9 mg/dL (ref 8.9–10.3)
Chloride: 107 mmol/L (ref 98–111)
Creatinine, Ser: 0.7 mg/dL (ref 0.61–1.24)
GFR calc Af Amer: >60 mL/min (ref >60)
GFR calc non Af Amer: >60 mL/min (ref >60)
Glucose, Bld: 97 mg/dL (ref 70–99)
Potassium: 4.2 mmol/L (ref 3.5–5.1)
Sodium: 137 mmol/L (ref 135–145)

## 2019-03-07 LAB — HEPATIC FUNCTION PANEL
ALT: 166 U/L — ABNORMAL HIGH (ref 0–44)
AST: 149 U/L — ABNORMAL HIGH (ref 15–41)
Albumin: 2.8 g/dL — ABNORMAL LOW (ref 3.5–5.0)
Alkaline Phosphatase: 95 U/L (ref 38–126)
Bilirubin, Direct: 0.3 mg/dL — ABNORMAL HIGH (ref 0.0–0.2)
Indirect Bilirubin: 0.6 mg/dL (ref 0.3–0.9)
Total Bilirubin: 0.9 mg/dL (ref 0.3–1.2)
Total Protein: 5.4 g/dL — ABNORMAL LOW (ref 6.5–8.1)

## 2019-03-07 LAB — CK: Total CK: 1841 U/L — ABNORMAL HIGH (ref 49–397)

## 2019-03-07 LAB — MAGNESIUM: Magnesium: 1.8 mg/dL (ref 1.7–2.4)

## 2019-03-07 MED ORDER — IBUPROFEN 200 MG PO TABS
400.0000 mg | ORAL_TABLET | Freq: Once | ORAL | Status: AC
  Administered 2019-03-07: 400 mg via ORAL
  Filled 2019-03-07: qty 2

## 2019-03-07 NOTE — Progress Notes (Addendum)
PROGRESS NOTE    [name]  ERX:[id number]  DOB: [birth year]  DOA: 03/02/2019 PCP: Scot Jun, FNP  Brief Narrative:  48 yo M w/ PMHx sig for HTN , chronic ETOH abuse, seizures, PTSD, GERD, smoker, gout, depression with anxiety presented with acute alcohol withdrawal to the ED after 3-4 days of not drinking. He was admitted to ICU with Precedex drip. Work up also revealed rhabdomyolysis , now improving with IV fluids. Labs also showed elevated LFTs consistent with alcoholic hepatitis. Discriminative function <32 no need for steroids. Coags wnl. Albumin 3.7 Diffuse fatty infiltration of the liver noted on CTA/P on 2017. Patient managed with ativan/cloazepam in ICU and Librium avoided. Now out of ICU and off Precedex drip,  remains on CIWA protocol.   Subjective:  Patient sitting in bedside chair, has mild tremors on extending hands but states that is his baseline. Required 4 mg of Ativan yesterday and on scheduled clonazepam. Foley catheter in place and states requiring walker to ambulate. Lives alone  Objective: Vitals:   03/07/19 0553 03/07/19 0832 03/07/19 0855 03/07/19 1227  BP: (!) 154/97  (!) 138/91 (!) 142/98  Pulse: 69 (!) 58 75 (!) 105  Resp:   16 15  Temp: 97.9 F (36.6 C)  98.2 F (36.8 C) 98.3 F (36.8 C)  TempSrc: Oral Oral Oral Oral  SpO2: 98%  96% 100%  Weight: 96.1 kg     Height:        Intake/Output Summary (Last 24 hours) at 03/07/2019 1244 Last data filed at 03/07/2019 1041 Gross per 24 hour  Intake 3506.19 ml  Output 3375 ml  Net 131.19 ml   Filed Weights   03/05/19 0547 03/06/19 0626 03/07/19 0553  Weight: 95.8 kg 95.6 kg 96.1 kg    Physical Examination:  General exam: Appears comfortable , awake, alert Respiratory system: Clear to auscultation. Respiratory effort normal. Cardiovascular system: S1 & S2 heard, RRR. No JVD, murmurs, rubs, gallops or clicks. No pedal edema. Gastrointestinal system: Abdomen is nondistended, soft and  nontender. No organomegaly or masses felt. Normal bowel sounds heard. Central nervous system: Alert and oriented. No focal neurological deficits. Mild intentional tremors, no asterixis Extremities: Symmetric 5 x 5 power. Skin: No rashes, lesions or ulcers Psychiatry:  Mood & affect anxious.     Data Reviewed: I have personally reviewed following labs and imaging studies  CBC: Recent Labs  Lab 03/02/19 0215 03/03/19 0336 03/04/19 0332  WBC 6.1 4.5 3.8*  NEUTROABS 4.7  --   --   HGB 15.2 12.9* 12.7*  HCT 44.1 36.2* 36.9*  MCV 100.5* 96.0 97.9  PLT 110* 82* 85*   Basic Metabolic Panel: Recent Labs  Lab 03/02/19 0747  03/02/19 1804 03/03/19 0336 03/04/19 0332 03/06/19 0728 03/07/19 0356  NA 133*   < > 134* 134* 138 137 137  K 2.6*   < > 3.3* 3.1* 3.7 3.4* 4.2  CL 96*   < > 98 99 105 104 107  CO2 23   < > 26 23 23 23 23   GLUCOSE 103*   < > 108* 91 100* 92 97  BUN 12   < > 10 9 8 10 12   CREATININE 0.78   < > 0.91 0.78 0.62 0.69 0.70  CALCIUM 8.4*   < > 8.3* 8.4* 8.6* 8.7* 9.0  MG 2.1  --   --  1.6* 2.0  --  1.8  PHOS 2.2*  --   --  2.3* 4.3  --   --    < > =  values in this interval not displayed.   GFR: Estimated Creatinine Clearance: 138.7 mL/min (by C-G formula based on SCr of 0.7 mg/dL). Liver Function Tests: Recent Labs  Lab 03/02/19 0747 03/03/19 0336 03/04/19 0332 03/06/19 0728 03/07/19 0356  AST 218* 357* 460* 266* 149*  ALT 63* 89* 147* 210* 166*  ALKPHOS 71 70 84 94 95  BILITOT 1.7* 1.3* 1.6* 1.3* 0.9  PROT 5.5* 5.2* 5.6* 5.5* 5.4*  ALBUMIN 3.0* 2.8* 2.8* 2.8* 2.8*   No results for input(s): LIPASE, AMYLASE in the last 168 hours. Recent Labs  Lab 03/02/19 0223  AMMONIA 47*   Coagulation Profile: Recent Labs  Lab 03/02/19 0215 03/06/19 0728  INR 1.0 1.0   Cardiac Enzymes: Recent Labs  Lab 03/02/19 0747 03/03/19 0336 03/04/19 0332 03/06/19 0728 03/07/19 0356  CKTOTAL 7,462* 11,332* 16,067* 4,600* 1,841*   BNP (last 3 results) No  results for input(s): PROBNP in the last 8760 hours. HbA1C: No results for input(s): HGBA1C in the last 72 hours. CBG: Recent Labs  Lab 03/02/19 0217 03/02/19 0638 03/02/19 1612 03/04/19 0818  GLUCAP 116* 108* 121* 107*   Lipid Profile: No results for input(s): CHOL, HDL, LDLCALC, TRIG, CHOLHDL, LDLDIRECT in the last 72 hours. Thyroid Function Tests: No results for input(s): TSH, T4TOTAL, FREET4, T3FREE, THYROIDAB in the last 72 hours. Anemia Panel: No results for input(s): VITAMINB12, FOLATE, FERRITIN, TIBC, IRON, RETICCTPCT in the last 72 hours. Sepsis Labs: Recent Labs  Lab 03/02/19 0221 03/02/19 0421 03/02/19 0747  LATICACIDVEN 7.0* 3.4* 1.6    Recent Results (from the past 240 hour(s))  SARS Coronavirus 2 (CEPHEID - Performed in [facility] hospital lab), Hosp Order     Status: None   Collection Time: 03/02/19  2:32 AM   Specimen: Nasopharyngeal Swab  Result Value Ref Range Status   SARS Coronavirus 2 NEGATIVE NEGATIVE Final    Comment: (NOTE) If result is NEGATIVE SARS-CoV-2 target nucleic acids are NOT DETECTED. The SARS-CoV-2 RNA is generally detectable in upper and lower  respiratory specimens during the acute phase of infection. The lowest  concentration of SARS-CoV-2 viral copies this assay can detect is 250  copies / mL. A negative result does not preclude SARS-CoV-2 infection  and should not be used as the sole basis for treatment or other  patient management decisions.  A negative result may occur with  improper specimen collection / handling, submission of specimen other  than nasopharyngeal swab, presence of viral mutation(s) within the  areas targeted by this assay, and inadequate number of viral copies  (<250 copies / mL). A negative result must be combined with clinical  observations, patient history, and epidemiological information. If result is POSITIVE SARS-CoV-2 target nucleic acids are DETECTED. The SARS-CoV-2 RNA is generally detectable in  upper and lower  respiratory specimens dur ing the acute phase of infection.  Positive  results are indicative of active infection with SARS-CoV-2.  Clinical  correlation with patient history and other diagnostic information is  necessary to determine patient infection status.  Positive results do  not rule out bacterial infection or co-infection with other viruses. If result is PRESUMPTIVE POSTIVE SARS-CoV-2 nucleic acids MAY BE PRESENT.   A presumptive positive result was obtained on the submitted specimen  and confirmed on repeat testing.  While 2019 novel coronavirus  (SARS-CoV-2) nucleic acids may be present in the submitted sample  additional confirmatory testing may be necessary for epidemiological  and / or clinical management purposes  to differentiate between  SARS-CoV-2  and other Sarbecovirus currently known to infect humans.  If clinically indicated additional testing with an alternate test  methodology [phone]) is advised. The SARS-CoV-2 RNA is generally  detectable in upper and lower respiratory sp ecimens during the acute  phase of infection. The expected result is Negative. Fact Sheet for Patients:  StrictlyIdeas.no Fact Sheet for Healthcare Providers: BankingDealers.co.za This test is not yet approved or cleared by the [place] FDA and has been authorized for detection and/or diagnosis of SARS-CoV-2 by FDA under an Emergency Use Authorization (EUA).  This EUA will remain in effect (meaning this test can be used) for the duration of the COVID-19 declaration under Section 564(b)(1) of the Act, 21 U.S.C. section 360bbb-3(b)(1), unless the authorization is terminated or revoked sooner. Performed at [facility], [place] [address] [place], [place] [address]   MRSA PCR Screening     Status: None   Collection Time: 03/02/19  7:40 AM   Specimen: Nasopharyngeal  Result Value Ref Range Status   MRSA by PCR NEGATIVE  NEGATIVE Final    Comment:        The GeneXpert MRSA Assay (FDA approved for NASAL specimens only), is one component of a comprehensive MRSA colonization surveillance program. It is not intended to diagnose MRSA infection nor to guide or monitor treatment for MRSA infections. Performed at [facility], [place] [address], [place], [place] [address]       Radiology Studies: No results found.      Scheduled Meds: . allopurinol  200 mg Oral Daily  . clonazePAM  1 mg Oral BID  . feeding supplement (ENSURE ENLIVE)  237 mL Oral BID BM  . folic acid  1 mg Oral Daily  . metoprolol tartrate  12.5 mg Oral BID  . multivitamin with minerals  1 tablet Oral Daily  . nicotine  21 mg Transdermal Daily  . pantoprazole  40 mg Oral Q1200  . sertraline  75 mg Oral Daily  . thiamine  100 mg Oral Daily   Continuous Infusions: . sodium chloride Stopped (03/03/19 1629)  . dextrose 5 % and 0.9% NaCl 100 mL/hr at 03/07/19 0549    Assessment & Plan:    1. Alcohol withdrawal: CIWA requirement coming down. Will d/c clonazepam and monitor. Has mild tremors but some of it might be baseline from chronic alcoholism.  MVI/thiamine/Folate  2. Rhabdomyolysis: CK improving and down to 1800 today. Can d/c IV fluids in am if CK <1000.   3. HTN: could be related to #1. On metoprolol  4. Alcoholic hepatitis: Improving with hydration.  High risk for cirrhosis give ongoing alcohol abuse and prior finding of fatty liver. D/C Clonazepam . Avoid Librium  5. Tobacco use: Nicotine patch  6. Schizoaffective disorder: Resumed home meds  7. Polysubstance abuse: UDS+ve for marijuana. Counseled regarding risks of alcohol/tobacco and drug abuse. May benefit from outpatient substance abuse referral  8. Deconditioning: PT eval. Still ataxic and requiring walker. D/C external foley   DVT prophylaxis: lovenox Code Status: full code Family / Patient Communication: D/W patient. Apparently lives alone  Disposition Plan: home if and when cleared by PT     LOS: 5 days    Time spent: 35 minutes    [place] Shi, MD [facility] Pager [phone]  If 7PM-7AM, please contact night-coverage [email or web address] Password [facility] 03/07/2019, 12:44 PM

## 2019-03-07 NOTE — Care Management Important Message (Signed)
Important Message  Patient Details  Name: [name] MRN: [id number] Date of Birth: 07-26-1971   Medicare Important Message Given:  Yes     [name] 03/07/2019, 2:16 PM

## 2019-03-07 NOTE — Evaluation (Signed)
Physical Therapy Evaluation Patient Details Name: [name] MRN: [id number] DOB: June 24, 1971 Today's Date: 03/07/2019   History of Present Illness  48 yo M w/ PMHx sig for HTN , chronic ETOH abuse, seizures, PTSD, GERD, smoker, gout, depression with anxiety presenting to ED after 3-4 days of not drinking. When asked pt says he stopped cause he wanted to feel better. He rmembers falling down but is not clear on timeline or surrounding events. Admitted 7/15 for alcohol withdrawl with delerium and rhabdomyolysis.   Clinical Impression  Patient evaluated by Physical Therapy with no further acute PT needs identified. All education has been completed and the patient has no further questions. Pt has minor balance issues which are solved by use of RW and able to ambulate and climb stairs at as supervision level.  See below for any follow-up Physical Therapy or equipment needs. PT is signing off. Thank you for this referral.     Follow Up Recommendations No PT follow up;Supervision - Intermittent    Equipment Recommendations  Rolling walker with 5" wheels    Recommendations for Other Services       Precautions / Restrictions Precautions Precautions: Fall Precaution Comments: hx of falls Restrictions Weight Bearing Restrictions: No      Mobility  Bed Mobility Overal bed mobility: Modified Independent             General bed mobility comments: increased time and use of bedrails to come to EoB  Transfers Overall transfer level: Needs assistance Equipment used: Rolling walker (2 wheeled) Transfers: Sit to/from Stand Sit to Stand: Supervision         General transfer comment: supervision for safety, vc for hand placement for powerup  Ambulation/Gait Ambulation/Gait assistance: Min guard;Supervision Gait Distance (Feet): 250 Feet Assistive device: Rolling walker (2 wheeled) Gait Pattern/deviations: Step-through pattern;Decreased step length - right;Decreased step length -  left;Trunk flexed Gait velocity: slowed Gait velocity interpretation: 1.31 - 2.62 ft/sec, indicative of limited community ambulator General Gait Details: min guard progressing to supervision for safety, pt reports legs feeling better with ambulation.   Stairs Stairs: Yes Stairs assistance: Supervision Stair Management: One rail Left;Forwards;Backwards Number of Stairs: 2 General stair comments: stair training limited by IV pole, however pt able to ascend/descend 2 steps with only supervision for safety   Wheelchair Mobility    Modified Rankin (Stroke Patients Only)       Balance Overall balance assessment: Needs assistance Sitting-balance support: No upper extremity supported;Feet supported Sitting balance-Leahy Scale: Fair     Standing balance support: Bilateral upper extremity supported Standing balance-Leahy Scale: Poor Standing balance comment: requires UE support for balance                             Pertinent Vitals/Pain Pain Assessment: No/denies pain    Home Living Family/patient expects to be discharged to:: Private residence Living Arrangements: Alone Available Help at Discharge: Family;Friend(s);Available PRN/intermittently Type of Home: Apartment Home Access: Stairs to enter Entrance Stairs-Rails: Left Entrance Stairs-Number of Steps: flight Home Layout: One level Home Equipment: None      Prior Function Level of Independence: Independent               Hand Dominance   Dominant Hand: Right    Extremity/Trunk Assessment   Upper Extremity Assessment Upper Extremity Assessment: Overall WFL for tasks assessed    Lower Extremity Assessment Lower Extremity Assessment: Overall WFL for tasks assessed  Communication   Communication: No difficulties  Cognition Arousal/Alertness: Awake/alert Behavior During Therapy: WFL for tasks assessed/performed Overall Cognitive Status: Within Functional Limits for tasks assessed                                         General Comments General comments (skin integrity, edema, etc.): brusing on arms, and scrape on knee from pt fall, pt does not recall events that lead up to fall, VSS        Assessment/Plan    PT Assessment Patent does not need any further PT services  PT Problem List Decreased balance       PT Treatment Interventions      PT Goals (Current goals can be found in the Care Plan section)  Acute Rehab PT Goals Patient Stated Goal: go home PT Goal Formulation: With patient     AM-PAC PT "6 Clicks" Mobility  Outcome Measure Help needed turning from your back to your side while in a flat bed without using bedrails?: None Help needed moving from lying on your back to sitting on the side of a flat bed without using bedrails?: None Help needed moving to and from a bed to a chair (including a wheelchair)?: None Help needed standing up from a chair using your arms (e.g., wheelchair or bedside chair)?: None Help needed to walk in hospital room?: None Help needed climbing 3-5 steps with a railing? : None 6 Click Score: 24    End of Session Equipment Utilized During Treatment: Gait belt Activity Tolerance: Patient tolerated treatment well Patient left: in chair;with call bell/phone within reach;with chair alarm set Nurse Communication: Mobility status PT Visit Diagnosis: Other abnormalities of gait and mobility (R26.89);Difficulty in walking, not elsewhere classified (R26.2);Muscle weakness (generalized) (M62.81)    Time: 0388-8280 PT Time Calculation (min) (ACUTE ONLY): 26 min   Charges:   PT Evaluation $PT Eval Moderate Complexity: 1 Mod PT Treatments $Gait Training: 8-22 mins        Kiamesha Samet B. [name] PT, DPT Acute Rehabilitation Services Pager [phone] Office [phone]   [place] 03/07/2019, 4:48 PM

## 2019-03-08 LAB — HEPATIC FUNCTION PANEL
ALT: 136 U/L — ABNORMAL HIGH (ref 0–44)
AST: 89 U/L — ABNORMAL HIGH (ref 15–41)
Albumin: 2.8 g/dL — ABNORMAL LOW (ref 3.5–5.0)
Alkaline Phosphatase: 95 U/L (ref 38–126)
Bilirubin, Direct: 0.2 mg/dL (ref 0.0–0.2)
Indirect Bilirubin: 0.5 mg/dL (ref 0.3–0.9)
Total Bilirubin: 0.7 mg/dL (ref 0.3–1.2)
Total Protein: 5.6 g/dL — ABNORMAL LOW (ref 6.5–8.1)

## 2019-03-08 LAB — CK: Total CK: 702 U/L — ABNORMAL HIGH (ref 49–397)

## 2019-03-08 MED ORDER — ADULT MULTIVITAMIN W/MINERALS CH: 1.0000 | ORAL_TABLET | Freq: Every day | ORAL | 1 refills | Status: AC

## 2019-03-08 MED ORDER — NICOTINE 21 MG/24HR TD PT24: 21.0000 mg | MEDICATED_PATCH | Freq: Every day | TRANSDERMAL | 0 refills | Status: DC

## 2019-03-08 MED ORDER — FOLIC ACID 1 MG PO TABS: 1.0000 mg | ORAL_TABLET | Freq: Every day | ORAL | 1 refills | Status: AC

## 2019-03-08 MED ORDER — THIAMINE HCL 100 MG PO TABS: 100.0000 mg | ORAL_TABLET | Freq: Every day | ORAL | 1 refills | Status: AC

## 2019-03-08 NOTE — Progress Notes (Signed)
Called [facility], CM regarding patient being discharged and has DME - Rolling walker; states "will place order". Also OT paged at [phone] following call to office # [phone] - No answer- for evaluation ordered, imminent discharge. Awaiting call back. Elita Boone, BSN, RN

## 2019-03-08 NOTE — Discharge Summary (Signed)
Physician Discharge Summary  [name] WUX:[id number] DOB: [birth year] DOA: 03/02/2019  PCP: Scot Jun, FNP  Admit date: 03/02/2019 Discharge date: 03/08/2019  Admitted From: Home Disposition: Home with home health  Recommendations for Outpatient Follow-up:  1. Follow up with PCP in 1-2 weeks 2. Please obtain BMP/CBC in one week your next doctors visit.  3. Counseled to quit drinking alcohol  Equipment/Devices: Rolling walker Discharge Condition: Stable CODE STATUS: Full code Diet recommendation: 2 g salt  Brief/Interim Summary: 48 yo M w/ PMHx sig for HTN , chronic ETOH abuse, seizures, PTSD, GERD, smoker, gout, depression with anxiety presented with acute alcohol withdrawal to the ED after 3-4 days of not drinking. He was admitted to ICU with Precedex drip. Work up also revealed rhabdomyolysis , now improving with IV fluids. Labs also showed elevated LFTs consistent with alcoholic hepatitis. Discriminative function <32 no need for steroids. Coags wnl. Albumin 3.7 Diffuse fatty infiltration of the liver noted on CTA/P on 2017. Patient managed with ativan/cloazepam in ICU and Librium avoided. Now out of ICU and off Precedex drip.  He did not show any more signs of alcohol withdrawal.  Today he is stable for discharge.  Discharge Diagnoses:  Principal Problem:   Alcohol withdrawal ([place]) Active Problems:   Alcohol abuse   Schizoaffective disorder, depressive type ([place])   Rhabdomyolysis   Alcoholic hepatitis without ascites  Severe alcohol withdrawal, resolved -Initially required Precedex drip and was in the ICU.  Currently no longer showing any signs of withdrawal.  We will discharge him with outpatient follow-up.  Prescribed a multivitamin, thiamine and folate.  He is counseled to quit using alcohol.  Rhabdomyolysis, resolved -Mild rhabdomyolysis which is improved with IV fluids.  Mild alcoholic hepatitis -Improved with hydration.  He is at high risk of developing  cirrhosis if he continues alcohol abuse.  Essential hypertension -Metoprolol  Schizoaffective disorder - Resume home meds.  Polysubstance use -Counseled to quit using alcohol, tobacco and other illicit drugs such as marijuana.  Consultations:  PCCM  Subjective: Feels better, wishes to go home.  No longer showing any signs of alcohol withdrawal.  Discharge Exam: Vitals:   03/07/19 2139 03/08/19 0639  BP: (!) 142/86 (!) 142/89  Pulse: 93 81  Resp: 16 18  Temp: 98.8 F (37.1 C) 98.6 F (37 C)  SpO2: 97% 96%   Vitals:   03/07/19 0855 03/07/19 1227 03/07/19 2139 03/08/19 0639  BP: (!) 138/91 (!) 142/98 (!) 142/86 (!) 142/89  Pulse: 75 (!) 105 93 81  Resp: 16 15 16 18   Temp: 98.2 F (36.8 C) 98.3 F (36.8 C) 98.8 F (37.1 C) 98.6 F (37 C)  TempSrc: Oral Oral Oral Oral  SpO2: 96% 100% 97% 96%  Weight:    96.3 kg  Height:        General: Pt is alert, awake, not in acute distress Cardiovascular: RRR, S1/S2 +, no rubs, no gallops Respiratory: CTA bilaterally, no wheezing, no rhonchi Abdominal: Soft, NT, ND, bowel sounds + Extremities: no edema, no cyanosis  Discharge Instructions  Discharge Instructions    Call MD for:  persistant dizziness or light-headedness   Complete by: As directed    Diet - low sodium heart healthy   Complete by: As directed    Discharge instructions   Complete by: As directed    You were cared for by a hospitalist during your hospital stay. If you have any questions about your discharge medications or the care you received while you were in  the hospital after you are discharged, you can call the unit and asked to speak with the hospitalist on call if the hospitalist that took care of you is not available. Once you are discharged, your primary care physician will handle any further medical issues. Please note that NO REFILLS for any discharge medications will be authorized once you are discharged, as it is imperative that you return to your  primary care physician (or establish a relationship with a primary care physician if you do not have one) for your aftercare needs so that they can reassess your need for medications and monitor your lab values.  Please request your Prim.MD to go over all Hospital Tests and Procedure/Radiological results at the follow up, please get all Hospital records sent to your Prim MD by signing hospital release before you go home.  Get CBC, CMP, 2 view Chest X ray checked  by Primary MD during your next visit or SNF MD in 5-7 days ( we routinely change or add medications that can affect your baseline labs and fluid status, therefore we recommend that you get the mentioned basic workup next visit with your PCP, your PCP may decide not to get them or add new tests based on their clinical decision)  On your next visit with your primary care physician please Get Medicines reviewed and adjusted.  If you experience worsening of your admission symptoms, develop shortness of breath, life threatening emergency, suicidal or homicidal thoughts you must seek medical attention immediately by calling 911 or calling your MD immediately  if symptoms less severe.  You Must read complete instructions/literature along with all the possible adverse reactions/side effects for all the Medicines you take and that have been prescribed to you. Take any new Medicines after you have completely understood and accpet all the possible adverse reactions/side effects.   Do not drive, operate heavy machinery, perform activities at heights, swimming or participation in water activities or provide baby sitting services if your were admitted for syncope or siezures until you have seen by Primary MD or a Neurologist and advised to do so again.  Do not drive when taking Pain medications   Increase activity slowly   Complete by: As directed      Allergies as of 03/08/2019      Reactions   Wellbutrin [bupropion] Other (See Comments)   Seizure        Medication List    TAKE these medications   allopurinol 100 MG tablet Commonly known as: ZYLOPRIM Take 2 tablets (200 mg total) by mouth daily.   amLODipine 10 MG tablet Commonly known as: NORVASC Take 1 tablet (10 mg total) by mouth daily.   cyclobenzaprine 5 MG tablet Commonly known as: FLEXERIL Take 1 tablet (5 mg total) by mouth 3 (three) times daily as needed for muscle spasms.   folic acid 1 MG tablet Commonly known as: FOLVITE Take 1 tablet (1 mg total) by mouth daily. Start taking on: March 09, 2019   metoprolol tartrate 25 MG tablet Commonly known as: LOPRESSOR Take 0.5 tablets (12.5 mg total) by mouth 2 (two) times daily.   multivitamin with minerals Tabs tablet Take 1 tablet by mouth daily. Start taking on: March 09, 2019   nicotine 21 mg/24hr patch Commonly known as: NICODERM CQ - dosed in mg/24 hours Place 1 patch (21 mg total) onto the skin daily. Start taking on: March 09, 2019   pantoprazole 40 MG tablet Commonly known as: PROTONIX Take 1 tablet (40 mg  total) by mouth daily.   sertraline 50 MG tablet Commonly known as: ZOLOFT Take 75 mg by mouth daily.   thiamine 100 MG tablet Take 1 tablet (100 mg total) by mouth daily. Start taking on: March 09, 2019            Durable Medical Equipment  (From admission, onward)         Start     Ordered   03/08/19 0932  For home use only DME Walker rolling  Once    Comments: Rolling wlker with 5 inch wheels  Question:  Patient needs a walker to treat with the following condition  Answer:  Ambulatory dysfunction   03/08/19 0932         Follow-up Information    Scot Jun, FNP. Schedule an appointment as soon as possible for a visit in 1 week(s).   Specialty: Family Medicine Contact information: [place] [address] [phone]        [place] Follow up.   Why: rolling walker arranged- to be delivered to room prior to discharge          Allergies  Allergen Reactions  . Wellbutrin [Bupropion] Other (See Comments)    Seizure     You were cared for by a hospitalist during your hospital stay. If you have any questions about your discharge medications or the care you received while you were in the hospital after you are discharged, you can call the unit and asked to speak with the hospitalist on call if the hospitalist that took care of you is not available. Once you are discharged, your primary care physician will handle any further medical issues. Please note that no refills for any discharge medications will be authorized once you are discharged, as it is imperative that you return to your primary care physician (or establish a relationship with a primary care physician if you do not have one) for your aftercare needs so that they can reassess your need for medications and monitor your lab values.   Procedures/Studies: Ct Head Wo Contrast  Result Date: 03/02/2019 CLINICAL DATA:  Altered level of consciousness EXAM: CT HEAD WITHOUT CONTRAST CT CERVICAL SPINE WITHOUT CONTRAST TECHNIQUE: Multidetector CT imaging of the head and cervical spine was performed following the standard protocol without intravenous contrast. Multiplanar CT image reconstructions of the cervical spine were also generated. COMPARISON:  01/10/2019 FINDINGS: CT HEAD FINDINGS Brain: No evidence of acute infarction, hemorrhage, hydrocephalus, extra-axial collection or mass lesion/mass effect. Age advanced brain atrophy that is generalized. Vascular: No hyperdense vessel or unexpected calcification. Skull: Patchy areas of scalp scarring. Negative for calvarial fracture Sinuses/Orbits: Negative CT CERVICAL SPINE FINDINGS Alignment: Normal. Skull base and vertebrae: Negative for fracture or bone lesion Soft tissues and spinal canal: No prevertebral fluid or swelling. No visible canal hematoma. Disc levels:  Mid cervical spondylosis. Upper chest: Negative IMPRESSION: 1. No  acute intracranial or cervical spine finding. 2. Age advanced brain atrophy. Electronically Signed   By: Monte Fantasia M.D.   On: 03/02/2019 04:44   Ct Cervical Spine Wo Contrast  Result Date: 03/02/2019 CLINICAL DATA:  Altered level of consciousness EXAM: CT HEAD WITHOUT CONTRAST CT CERVICAL SPINE WITHOUT CONTRAST TECHNIQUE: Multidetector CT imaging of the head and cervical spine was performed following the standard protocol without intravenous contrast. Multiplanar CT image reconstructions of the cervical spine were also generated. COMPARISON:  01/10/2019 FINDINGS: CT HEAD FINDINGS Brain: No evidence of acute infarction, hemorrhage, hydrocephalus, extra-axial  collection or mass lesion/mass effect. Age advanced brain atrophy that is generalized. Vascular: No hyperdense vessel or unexpected calcification. Skull: Patchy areas of scalp scarring. Negative for calvarial fracture Sinuses/Orbits: Negative CT CERVICAL SPINE FINDINGS Alignment: Normal. Skull base and vertebrae: Negative for fracture or bone lesion Soft tissues and spinal canal: No prevertebral fluid or swelling. No visible canal hematoma. Disc levels:  Mid cervical spondylosis. Upper chest: Negative IMPRESSION: 1. No acute intracranial or cervical spine finding. 2. Age advanced brain atrophy. Electronically Signed   By: Monte Fantasia M.D.   On: 03/02/2019 04:44   Dg Chest Port 1 View  Result Date: 03/03/2019 CLINICAL DATA:  Onset altered mental status yesterday. EXAM: PORTABLE CHEST 1 VIEW COMPARISON:  PA and lateral chest 06/01/2018 and 09/30/2015 FINDINGS: The lungs are clear. Heart size is normal. No pneumothorax or pleural fluid. Remote right rib fractures noted. IMPRESSION: No acute disease. Electronically Signed   By: Inge Rise M.D.   On: 03/03/2019 08:16   Dg Chest Portable 1 View  Result Date: 03/02/2019 CLINICAL DATA:  48 year old male with altered mental status. EXAM: PORTABLE CHEST 1 VIEW COMPARISON:  Chest CTA 06/01/2018  and earlier. FINDINGS: Portable AP semi upright view at 0222 hours. Lung volumes and mediastinal contours are stable and within normal limits. Visualized tracheal air column is within normal limits. Allowing for portable technique the lungs are clear. Chronic right lateral 7th rib fracture. No acute osseous abnormality identified. IMPRESSION: No acute cardiopulmonary abnormality. Electronically Signed   By: Genevie Ann M.D.   On: 03/02/2019 02:42      The results of significant diagnostics from this hospitalization (including imaging, microbiology, ancillary and laboratory) are listed below for reference.     Microbiology: Recent Results (from the past 240 hour(s))  SARS Coronavirus 2 (CEPHEID - Performed in [place] hospital lab), Hosp Order     Status: None   Collection Time: 03/02/19  2:32 AM   Specimen: Nasopharyngeal Swab  Result Value Ref Range Status   SARS Coronavirus 2 NEGATIVE NEGATIVE Final    Comment: (NOTE) If result is NEGATIVE SARS-CoV-2 target nucleic acids are NOT DETECTED. The SARS-CoV-2 RNA is generally detectable in upper and lower  respiratory specimens during the acute phase of infection. The lowest  concentration of SARS-CoV-2 viral copies this assay can detect is 250  copies / mL. A negative result does not preclude SARS-CoV-2 infection  and should not be used as the sole basis for treatment or other  patient management decisions.  A negative result may occur with  improper specimen collection / handling, submission of specimen other  than nasopharyngeal swab, presence of viral mutation(s) within the  areas targeted by this assay, and inadequate number of viral copies  (<250 copies / mL). A negative result must be combined with clinical  observations, patient history, and epidemiological information. If result is POSITIVE SARS-CoV-2 target nucleic acids are DETECTED. The SARS-CoV-2 RNA is generally detectable in upper and lower  respiratory specimens dur ing  the acute phase of infection.  Positive  results are indicative of active infection with SARS-CoV-2.  Clinical  correlation with patient history and other diagnostic information is  necessary to determine patient infection status.  Positive results do  not rule out bacterial infection or co-infection with other viruses. If result is PRESUMPTIVE POSTIVE SARS-CoV-2 nucleic acids MAY BE PRESENT.   A presumptive positive result was obtained on the submitted specimen  and confirmed on repeat testing.  While 2019 novel coronavirus  (SARS-CoV-2)  nucleic acids may be present in the submitted sample  additional confirmatory testing may be necessary for epidemiological  and / or clinical management purposes  to differentiate between  SARS-CoV-2 and other Sarbecovirus currently known to infect humans.  If clinically indicated additional testing with an alternate test  methodology [phone]) is advised. The SARS-CoV-2 RNA is generally  detectable in upper and lower respiratory sp ecimens during the acute  phase of infection. The expected result is Negative. Fact Sheet for Patients:  StrictlyIdeas.no Fact Sheet for Healthcare Providers: BankingDealers.co.za This test is not yet approved or cleared by the [place] FDA and has been authorized for detection and/or diagnosis of SARS-CoV-2 by FDA under an Emergency Use Authorization (EUA).  This EUA will remain in effect (meaning this test can be used) for the duration of the COVID-19 declaration under Section 564(b)(1) of the Act, 21 U.S.C. section 360bbb-3(b)(1), unless the authorization is terminated or revoked sooner. Performed at [facility], [place] [address] [place], [place] [address]   MRSA PCR Screening     Status: None   Collection Time: 03/02/19  7:40 AM   Specimen: Nasopharyngeal  Result Value Ref Range Status   MRSA by PCR NEGATIVE NEGATIVE Final    Comment:        The GeneXpert  MRSA Assay (FDA approved for NASAL specimens only), is one component of a comprehensive MRSA colonization surveillance program. It is not intended to diagnose MRSA infection nor to guide or monitor treatment for MRSA infections. Performed at [facility], [place] [address], [place], [place] [address]      Labs: BNP (last 3 results) No results for input(s): BNP in the last 8760 hours. Basic Metabolic Panel: Recent Labs  Lab 03/02/19 0747  03/02/19 1804 03/03/19 0336 03/04/19 0332 03/06/19 0728 03/07/19 0356  NA 133*   < > 134* 134* 138 137 137  K 2.6*   < > 3.3* 3.1* 3.7 3.4* 4.2  CL 96*   < > 98 99 105 104 107  CO2 23   < > 26 23 23 23 23   GLUCOSE 103*   < > 108* 91 100* 92 97  BUN 12   < > 10 9 8 10 12   CREATININE 0.78   < > 0.91 0.78 0.62 0.69 0.70  CALCIUM 8.4*   < > 8.3* 8.4* 8.6* 8.7* 9.0  MG 2.1  --   --  1.6* 2.0  --  1.8  PHOS 2.2*  --   --  2.3* 4.3  --   --    < > = values in this interval not displayed.   Liver Function Tests: Recent Labs  Lab 03/03/19 0336 03/04/19 0332 03/06/19 0728 03/07/19 0356 03/08/19 0450  AST 357* 460* 266* 149* 89*  ALT 89* 147* 210* 166* 136*  ALKPHOS 70 84 94 95 95  BILITOT 1.3* 1.6* 1.3* 0.9 0.7  PROT 5.2* 5.6* 5.5* 5.4* 5.6*  ALBUMIN 2.8* 2.8* 2.8* 2.8* 2.8*   No results for input(s): LIPASE, AMYLASE in the last 168 hours. Recent Labs  Lab 03/02/19 0223  AMMONIA 47*   CBC: Recent Labs  Lab 03/02/19 0215 03/03/19 0336 03/04/19 0332  WBC 6.1 4.5 3.8*  NEUTROABS 4.7  --   --   HGB 15.2 12.9* 12.7*  HCT 44.1 36.2* 36.9*  MCV 100.5* 96.0 97.9  PLT 110* 82* 85*   Cardiac Enzymes: Recent Labs  Lab 03/03/19 0336 03/04/19 0332 03/06/19 0728 03/07/19 0356 03/08/19 0450  CKTOTAL 11,332* 16,067* 4,600* 1,841* 702*   BNP: Invalid input(s): POCBNP CBG: Recent Labs  Lab 03/02/19 0217 03/02/19 0638 03/02/19 1612 03/04/19 0818  GLUCAP 116* 108* 121* 107*   D-Dimer No results for input(s): DDIMER in  the last 72 hours. Hgb A1c No results for input(s): HGBA1C in the last 72 hours. Lipid Profile No results for input(s): CHOL, HDL, LDLCALC, TRIG, CHOLHDL, LDLDIRECT in the last 72 hours. Thyroid function studies No results for input(s): TSH, T4TOTAL, T3FREE, THYROIDAB in the last 72 hours.  Invalid input(s): FREET3 Anemia work up No results for input(s): VITAMINB12, FOLATE, FERRITIN, TIBC, IRON, RETICCTPCT in the last 72 hours. Urinalysis    Component Value Date/Time   COLORURINE YELLOW 03/03/2019 [place] 03/03/2019 0843   LABSPEC 1.012 03/03/2019 0843   PHURINE 7.0 03/03/2019 0843   GLUCOSEU NEGATIVE 03/03/2019 0843   HGBUR LARGE (A) 03/03/2019 0843   BILIRUBINUR NEGATIVE 03/03/2019 0843   KETONESUR 5 (A) 03/03/2019 0843   PROTEINUR 30 (A) 03/03/2019 0843   UROBILINOGEN 1.0 12/24/2014 0643   NITRITE NEGATIVE 03/03/2019 0843   LEUKOCYTESUR LARGE (A) 03/03/2019 0843   Sepsis Labs Invalid input(s): PROCALCITONIN,  WBC,  LACTICIDVEN Microbiology Recent Results (from the past 240 hour(s))  SARS Coronavirus 2 (CEPHEID - Performed in [place] hospital lab), Hosp Order     Status: None   Collection Time: 03/02/19  2:32 AM   Specimen: Nasopharyngeal Swab  Result Value Ref Range Status   SARS Coronavirus 2 NEGATIVE NEGATIVE Final    Comment: (NOTE) If result is NEGATIVE SARS-CoV-2 target nucleic acids are NOT DETECTED. The SARS-CoV-2 RNA is generally detectable in upper and lower  respiratory specimens during the acute phase of infection. The lowest  concentration of SARS-CoV-2 viral copies this assay can detect is 250  copies / mL. A negative result does not preclude SARS-CoV-2 infection  and should not be used as the sole basis for treatment or other  patient management decisions.  A negative result may occur with  improper specimen collection / handling, submission of specimen other  than nasopharyngeal swab, presence of viral mutation(s) within the   areas targeted by this assay, and inadequate number of viral copies  (<250 copies / mL). A negative result must be combined with clinical  observations, patient history, and epidemiological information. If result is POSITIVE SARS-CoV-2 target nucleic acids are DETECTED. The SARS-CoV-2 RNA is generally detectable in upper and lower  respiratory specimens dur ing the acute phase of infection.  Positive  results are indicative of active infection with SARS-CoV-2.  Clinical  correlation with patient history and other diagnostic information is  necessary to determine patient infection status.  Positive results do  not rule out bacterial infection or co-infection with other viruses. If result is PRESUMPTIVE POSTIVE SARS-CoV-2 nucleic acids MAY BE PRESENT.   A presumptive positive result was obtained on the submitted specimen  and confirmed on repeat testing.  While 2019 novel coronavirus  (SARS-CoV-2) nucleic acids may be present in the submitted sample  additional confirmatory testing may be necessary for epidemiological  and / or clinical management purposes  to differentiate between  SARS-CoV-2 and other Sarbecovirus currently known to infect humans.  If clinically indicated additional testing with an alternate test  methodology [phone]) is advised. The SARS-CoV-2 RNA is generally  detectable in upper and lower respiratory sp ecimens during the acute  phase of infection. The expected result is Negative. Fact Sheet for Patients:  StrictlyIdeas.no Fact Sheet for  Healthcare Providers: BankingDealers.co.za This test is not yet approved or cleared by the [place] and has been authorized for detection and/or diagnosis of SARS-CoV-2 by FDA under an Emergency Use Authorization (EUA).  This EUA will remain in effect (meaning this test can be used) for the duration of the COVID-19 declaration under Section 564(b)(1) of the Act, 21  U.S.C. section 360bbb-3(b)(1), unless the authorization is terminated or revoked sooner. Performed at [facility], [place] [address] [place], [place] [address]   MRSA PCR Screening     Status: None   Collection Time: 03/02/19  7:40 AM   Specimen: Nasopharyngeal  Result Value Ref Range Status   MRSA by PCR NEGATIVE NEGATIVE Final    Comment:        The GeneXpert MRSA Assay (FDA approved for NASAL specimens only), is one component of a comprehensive MRSA colonization surveillance program. It is not intended to diagnose MRSA infection nor to guide or monitor treatment for MRSA infections. Performed at [facility], [place] [address], [place], [place] [address]      Time coordinating discharge:  I have spent 35 minutes face to face with the patient and on the ward discussing the patients care, assessment, plan and disposition with other care givers. >50% of the time was devoted counseling the patient about the risks and benefits of treatment/Discharge disposition and coordinating care.   SIGNED:   Damita Lack, MD  [facility] 03/08/2019, 12:51 PM   If 7PM-7AM, please contact night-coverage [email or web address]

## 2019-03-08 NOTE — Evaluation (Signed)
Occupational Therapy Evaluation and Discharge Patient Details Name: [name] MRN: [id number] DOB: [birth year] Today's Date: 03/08/2019    History of Present Illness 48 yo M w/ PMHx sig for HTN , chronic ETOH abuse, seizures, PTSD, GERD, smoker, gout, depression with anxiety presenting to ED after 3-4 days of not drinking. When asked pt says he stopped cause he wanted to feel better. He rmembers falling down but is not clear on timeline or surrounding events. Admitted 7/15 for alcohol withdrawl with delerium and rhabdomyolysis.    Clinical Impression   Pt at baseline levels for ADLs and functional mobility. Slight balance deficits but corrected with RW. Provided pt with fall risk reduction education and energy conservation strategies. Pt receptive and eager to d/c home. No further OT needs, OT will sign off.    Follow Up Recommendations  No OT follow up    Equipment Recommendations  None recommended by OT       Precautions / Restrictions Precautions Precautions: Fall Restrictions Weight Bearing Restrictions: No      Mobility Bed Mobility Overal bed mobility: Independent                Transfers Overall transfer level: Modified independent Equipment used: Rolling walker (2 wheeled)             General transfer comment: Good carryover of previous PT session edu, only minimal cueing for RW management    Balance Overall balance assessment: Mild deficits observed, not formally tested                                         ADL either performed or assessed with clinical judgement   ADL Overall ADL's : At baseline;Independent                                             Vision Baseline Vision/History: Wears glasses Wears Glasses: At all times Patient Visual Report: No change from baseline Vision Assessment?: No apparent visual deficits            Pertinent Vitals/Pain Pain Assessment: No/denies pain     Hand  Dominance Right   Extremity/Trunk Assessment Upper Extremity Assessment Upper Extremity Assessment: Overall WFL for tasks assessed   Lower Extremity Assessment Lower Extremity Assessment: Overall WFL for tasks assessed   Cervical / Trunk Assessment Cervical / Trunk Assessment: Normal   Communication Communication Communication: No difficulties   Cognition Arousal/Alertness: Awake/alert Behavior During Therapy: WFL for tasks assessed/performed Overall Cognitive Status: Within Functional Limits for tasks assessed                                 General Comments: Polite and cooperative   General Comments  No SOB following 100 ft of functional mobility with RW            Home Living Family/patient expects to be discharged to:: Private residence Living Arrangements: Alone Available Help at Discharge: Family;Friend(s);Available PRN/intermittently Type of Home: Apartment Home Access: Stairs to enter Entrance Stairs-Number of Steps: flight Entrance Stairs-Rails: Left Home Layout: One level     Bathroom Shower/Tub: Occupational psychologist: Standard Bathroom Accessibility: Yes   Home Equipment: None  Prior Functioning/Environment Level of Independence: Independent        Comments: Pt cooks for himself, rides scooter to the store, his siblings occasionally help him get groceries. Not working        OT Problem List: Impaired balance (sitting and/or standing)         OT Goals(Current goals can be found in the care plan section) Acute Rehab OT Goals Patient Stated Goal: go home OT Goal Formulation: All assessment and education complete, DC therapy  OT Frequency:      AM-PAC OT "6 Clicks" Daily Activity     Outcome Measure Help from another person eating meals?: None Help from another person taking care of personal grooming?: None Help from another person toileting, which includes using toliet, bedpan, or urinal?: None Help from  another person bathing (including washing, rinsing, drying)?: None Help from another person to put on and taking off regular upper body clothing?: None Help from another person to put on and taking off regular lower body clothing?: None 6 Click Score: 24   End of Session Equipment Utilized During Treatment: Rolling walker Nurse Communication: Mobility status  Activity Tolerance: Patient tolerated treatment well Patient left: in chair;with call bell/phone within reach  OT Visit Diagnosis: Repeated falls (R29.6)                Time: 9432-7614 OT Time Calculation (min): 9 min Charges:  OT General Charges $OT Visit: 1 Visit OT Evaluation $OT Eval Low Complexity: 1 Low  Curtis Sites  OTR/L  03/08/2019, 12:39 PM

## 2019-03-08 NOTE — Progress Notes (Signed)
D/C instructions given and explained. Verbalized understanding and patient has no questions. D/C home, Self care.

## 2019-03-16 ENCOUNTER — Other Ambulatory Visit: Payer: Self-pay

## 2019-03-16 ENCOUNTER — Inpatient Hospital Stay (HOSPITAL_COMMUNITY)
Admission: EM | Admit: 2019-03-16 | Discharge: 2019-03-20 | DRG: 897 | Disposition: A | Discharge status: Home / Self Care | Payer: Medicare Other | Attending: Internal Medicine | Admitting: Internal Medicine

## 2019-03-16 ENCOUNTER — Encounter (HOSPITAL_COMMUNITY): Payer: Self-pay | Admitting: *Deleted

## 2019-03-16 ENCOUNTER — Emergency Department (HOSPITAL_COMMUNITY): Payer: Medicare Other

## 2019-03-16 DIAGNOSIS — K219 Gastro-esophageal reflux disease without esophagitis: Secondary | ICD-10-CM | POA: Diagnosis present

## 2019-03-16 DIAGNOSIS — Z8249 Family history of ischemic heart disease and other diseases of the circulatory system: Secondary | ICD-10-CM | POA: Diagnosis not present

## 2019-03-16 DIAGNOSIS — Z811 Family history of alcohol abuse and dependence: Secondary | ICD-10-CM | POA: Diagnosis not present

## 2019-03-16 DIAGNOSIS — Y904 Blood alcohol level of 80-99 mg/100 ml: Secondary | ICD-10-CM | POA: Diagnosis present

## 2019-03-16 DIAGNOSIS — R062 Wheezing: Secondary | ICD-10-CM | POA: Diagnosis not present

## 2019-03-16 DIAGNOSIS — F329 Major depressive disorder, single episode, unspecified: Secondary | ICD-10-CM | POA: Diagnosis present

## 2019-03-16 DIAGNOSIS — G40909 Epilepsy, unspecified, not intractable, without status epilepticus: Secondary | ICD-10-CM | POA: Diagnosis present

## 2019-03-16 DIAGNOSIS — R0789 Other chest pain: Secondary | ICD-10-CM | POA: Diagnosis present

## 2019-03-16 DIAGNOSIS — I10 Essential (primary) hypertension: Secondary | ICD-10-CM | POA: Diagnosis present

## 2019-03-16 DIAGNOSIS — F17210 Nicotine dependence, cigarettes, uncomplicated: Secondary | ICD-10-CM | POA: Diagnosis present

## 2019-03-16 DIAGNOSIS — M109 Gout, unspecified: Secondary | ICD-10-CM | POA: Diagnosis present

## 2019-03-16 DIAGNOSIS — F10939 Alcohol use, unspecified with withdrawal, unspecified: Secondary | ICD-10-CM | POA: Diagnosis present

## 2019-03-16 DIAGNOSIS — F10230 Alcohol dependence with withdrawal, uncomplicated: Secondary | ICD-10-CM

## 2019-03-16 DIAGNOSIS — E876 Hypokalemia: Secondary | ICD-10-CM | POA: Diagnosis present

## 2019-03-16 DIAGNOSIS — R079 Chest pain, unspecified: Secondary | ICD-10-CM | POA: Diagnosis not present

## 2019-03-16 DIAGNOSIS — F29 Unspecified psychosis not due to a substance or known physiological condition: Secondary | ICD-10-CM | POA: Diagnosis present

## 2019-03-16 DIAGNOSIS — Z20828 Contact with and (suspected) exposure to other viral communicable diseases: Secondary | ICD-10-CM | POA: Diagnosis present

## 2019-03-16 DIAGNOSIS — M1A9XX0 Chronic gout, unspecified, without tophus (tophi): Secondary | ICD-10-CM | POA: Diagnosis not present

## 2019-03-16 DIAGNOSIS — F259 Schizoaffective disorder, unspecified: Secondary | ICD-10-CM | POA: Diagnosis present

## 2019-03-16 DIAGNOSIS — R000 Tachycardia, unspecified: Secondary | ICD-10-CM | POA: Diagnosis not present

## 2019-03-16 DIAGNOSIS — F10239 Alcohol dependence with withdrawal, unspecified: Principal | ICD-10-CM | POA: Diagnosis present

## 2019-03-16 DIAGNOSIS — Z79899 Other long term (current) drug therapy: Secondary | ICD-10-CM

## 2019-03-16 DIAGNOSIS — R0902 Hypoxemia: Secondary | ICD-10-CM | POA: Diagnosis not present

## 2019-03-16 DIAGNOSIS — Z888 Allergy status to other drugs, medicaments and biological substances status: Secondary | ICD-10-CM

## 2019-03-16 DIAGNOSIS — Z7982 Long term (current) use of aspirin: Secondary | ICD-10-CM

## 2019-03-16 DIAGNOSIS — F4310 Post-traumatic stress disorder, unspecified: Secondary | ICD-10-CM | POA: Diagnosis present

## 2019-03-16 LAB — BASIC METABOLIC PANEL
Anion gap: 16 — ABNORMAL HIGH (ref 5–15)
BUN: <5 mg/dL — ABNORMAL LOW (ref 6–20)
CO2: 27 mmol/L (ref 22–32)
Calcium: 9.1 mg/dL (ref 8.9–10.3)
Chloride: 97 mmol/L — ABNORMAL LOW (ref 98–111)
Creatinine, Ser: 0.71 mg/dL (ref 0.61–1.24)
GFR calc Af Amer: >60 mL/min (ref >60)
GFR calc non Af Amer: >60 mL/min (ref >60)
Glucose, Bld: 107 mg/dL — ABNORMAL HIGH (ref 70–99)
Potassium: 3.8 mmol/L (ref 3.5–5.1)
Sodium: 140 mmol/L (ref 135–145)

## 2019-03-16 LAB — TROPONIN I (HIGH SENSITIVITY)
Troponin I (High Sensitivity): 5 ng/L (ref <18)
Troponin I (High Sensitivity): 15 ng/L (ref <18)

## 2019-03-16 LAB — CBC
HCT: 44.9 % (ref 39.0–52.0)
Hemoglobin: 15 g/dL (ref 13.0–17.0)
MCH: 33.6 pg (ref 26.0–34.0)
MCHC: 33.4 g/dL (ref 30.0–36.0)
MCV: 100.7 fL — ABNORMAL HIGH (ref 80.0–100.0)
Platelets: 545 10*3/uL — ABNORMAL HIGH (ref 150–400)
RBC: 4.46 MIL/uL (ref 4.22–5.81)
RDW: 15.3 % (ref 11.5–15.5)
WBC: 5.3 10*3/uL (ref 4.0–10.5)
nRBC: 0 % (ref 0.0–0.2)

## 2019-03-16 LAB — SARS CORONAVIRUS 2 BY RT PCR (HOSPITAL ORDER, PERFORMED IN ~~LOC~~ HOSPITAL LAB): SARS Coronavirus 2: NEGATIVE

## 2019-03-16 LAB — ETHANOL: Alcohol, Ethyl (B): 83 mg/dL — ABNORMAL HIGH (ref <10)

## 2019-03-16 LAB — CK: Total CK: 122 U/L (ref 49–397)

## 2019-03-16 MED ORDER — METOPROLOL TARTRATE 12.5 MG HALF TABLET
12.5000 mg | ORAL_TABLET | Freq: Two times a day (BID) | ORAL | Status: DC
  Administered 2019-03-16 – 2019-03-20 (×8): 12.5 mg via ORAL
  Filled 2019-03-16 (×8): qty 1

## 2019-03-16 MED ORDER — LORAZEPAM 2 MG/ML IJ SOLN: 0.0000 mg | Freq: Two times a day (BID) | INTRAMUSCULAR | Status: DC

## 2019-03-16 MED ORDER — SODIUM CHLORIDE 0.9 % IV BOLUS
1000.0000 mL | Freq: Once | INTRAVENOUS | Status: AC
  Administered 2019-03-16: 1000 mL via INTRAVENOUS

## 2019-03-16 MED ORDER — LORAZEPAM 1 MG PO TABS: 0.0000 mg | ORAL_TABLET | Freq: Two times a day (BID) | ORAL | Status: DC

## 2019-03-16 MED ORDER — LORAZEPAM 1 MG PO TABS: 0.0000 mg | ORAL_TABLET | Freq: Four times a day (QID) | ORAL | Status: DC

## 2019-03-16 MED ORDER — ALLOPURINOL 100 MG PO TABS
200.0000 mg | ORAL_TABLET | Freq: Every day | ORAL | Status: DC
  Administered 2019-03-16 – 2019-03-20 (×5): 200 mg via ORAL
  Filled 2019-03-16 (×5): qty 2

## 2019-03-16 MED ORDER — ONDANSETRON HCL 4 MG/2ML IJ SOLN
4.0000 mg | Freq: Four times a day (QID) | INTRAMUSCULAR | Status: DC | PRN
  Administered 2019-03-16 – 2019-03-18 (×4): 4 mg via INTRAVENOUS
  Filled 2019-03-16 (×4): qty 2

## 2019-03-16 MED ORDER — LORAZEPAM 2 MG/ML IJ SOLN
0.0000 mg | Freq: Four times a day (QID) | INTRAMUSCULAR | Status: DC
  Administered 2019-03-16: 2 mg via INTRAVENOUS
  Filled 2019-03-16: qty 1

## 2019-03-16 MED ORDER — NICOTINE 21 MG/24HR TD PT24
21.0000 mg | MEDICATED_PATCH | Freq: Every day | TRANSDERMAL | Status: DC
  Administered 2019-03-16 – 2019-03-20 (×5): 21 mg via TRANSDERMAL
  Filled 2019-03-16 (×5): qty 1

## 2019-03-16 MED ORDER — PANTOPRAZOLE SODIUM 40 MG PO TBEC
40.0000 mg | DELAYED_RELEASE_TABLET | Freq: Every day | ORAL | Status: DC
  Administered 2019-03-16 – 2019-03-20 (×5): 40 mg via ORAL
  Filled 2019-03-16 (×5): qty 1

## 2019-03-16 MED ORDER — LORAZEPAM 2 MG/ML IJ SOLN
2.0000 mg | INTRAMUSCULAR | Status: DC | PRN
  Administered 2019-03-16 – 2019-03-18 (×16): 2 mg via INTRAVENOUS
  Filled 2019-03-16 (×17): qty 1

## 2019-03-16 MED ORDER — SERTRALINE HCL 50 MG PO TABS
75.0000 mg | ORAL_TABLET | Freq: Every day | ORAL | Status: DC
  Administered 2019-03-17 – 2019-03-20 (×4): 75 mg via ORAL
  Filled 2019-03-16 (×4): qty 1

## 2019-03-16 MED ORDER — ENOXAPARIN SODIUM 40 MG/0.4ML ~~LOC~~ SOLN
40.0000 mg | SUBCUTANEOUS | Status: DC
  Administered 2019-03-16 – 2019-03-19 (×4): 40 mg via SUBCUTANEOUS
  Filled 2019-03-16 (×4): qty 0.4

## 2019-03-16 MED ORDER — AMLODIPINE BESYLATE 10 MG PO TABS
10.0000 mg | ORAL_TABLET | Freq: Every day | ORAL | Status: DC
  Administered 2019-03-16 – 2019-03-20 (×5): 10 mg via ORAL
  Filled 2019-03-16 (×5): qty 1

## 2019-03-16 MED ORDER — VITAMIN B-1 100 MG PO TABS: 100.0000 mg | ORAL_TABLET | Freq: Every day | ORAL | Status: DC

## 2019-03-16 MED ORDER — FOLIC ACID 1 MG PO TABS
1.0000 mg | ORAL_TABLET | Freq: Every day | ORAL | Status: DC
  Administered 2019-03-16 – 2019-03-20 (×5): 1 mg via ORAL
  Filled 2019-03-16 (×5): qty 1

## 2019-03-16 MED ORDER — SODIUM CHLORIDE 0.9% FLUSH: 3.0000 mL | Freq: Once | INTRAVENOUS | Status: DC

## 2019-03-16 MED ORDER — VITAMIN B-1 100 MG PO TABS
100.0000 mg | ORAL_TABLET | Freq: Every day | ORAL | Status: DC
  Administered 2019-03-17 – 2019-03-20 (×4): 100 mg via ORAL
  Filled 2019-03-16 (×4): qty 1

## 2019-03-16 MED ORDER — THIAMINE HCL 100 MG/ML IJ SOLN
100.0000 mg | Freq: Every day | INTRAMUSCULAR | Status: DC
  Administered 2019-03-16: 100 mg via INTRAVENOUS
  Filled 2019-03-16: qty 2

## 2019-03-16 MED ORDER — ADULT MULTIVITAMIN W/MINERALS CH
1.0000 | ORAL_TABLET | Freq: Every day | ORAL | Status: DC
  Administered 2019-03-16 – 2019-03-20 (×5): 1 via ORAL
  Filled 2019-03-16 (×5): qty 1

## 2019-03-16 NOTE — ED Notes (Signed)
ED TO INPATIENT HANDOFF REPORT  ED Nurse Name and Phone #: [phone]  S Name/Age/Gender [name] 48 y.o. male Room/Bed: 039C/039C  Code Status   Code Status: Full Code  Home/SNF/Other Home Patient oriented to: self, place, time and situation Is this baseline? Yes   Triage Complete: Triage complete  Chief Complaint CHest Pain  Triage Note Pt arrived by [facility] for midsternal cp this am. Was recently hospitalized for alcohol withdrawal and seizures. Last drank last night. Tremors noted by ems, iv fluids given with zofran 4mg  ivp.    Allergies Allergies  Allergen Reactions  . Wellbutrin [Bupropion] Other (See Comments)    Caused seizures    Level of Care/Admitting Diagnosis ED Disposition    ED Disposition Condition Comment   Admit  Hospital Area: [facility] [[id number]]  Level of Care: Progressive [102]  Covid Evaluation: Confirmed COVID Negative  Diagnosis: Alcohol withdrawal ([place]) [291.81.ICD-9-CM]  Admitting Physician: Shela Leff [[id number]]  Attending Physician: Shela Leff [[id number]]  Estimated length of stay: past midnight tomorrow  Certification:: I certify this patient will need inpatient services for at least 2 midnights  PT Class (Do Not Modify): Inpatient [101]  PT Acc Code (Do Not Modify): Private [1]       B Medical/Surgery History Past Medical History:  Diagnosis Date  . Alcohol abuse   . Anxiety    at age 64  . Depression    at age 89  . Psychosis ([place])   . PTSD (post-traumatic stress disorder)   . Schizoaffective disorder   . Seizure disorder ([place])    related to etoh seizure   Past Surgical History:  Procedure Laterality Date  . FOOT SURGERY     right  . HAND SURGERY       A IV Location/Drains/Wounds Patient Lines/Drains/Airways Status   Active Line/Drains/Airways    Name:   Placement date:   Placement time:   Site:   Days:   Peripheral IV 03/16/19 Left Antecubital   03/16/19    1530    Antecubital    less than 1   Wound / Incision (Open or Dehisced) 01/11/19 Incision - Open Head Right;Lateral lacerations froma fail in on a street   01/11/19    0800    Head   64          Intake/Output Last 24 hours No intake or output data in the 24 hours ending 03/16/19 2046  Labs/Imaging Results for orders placed or performed during the hospital encounter of 03/16/19 (from the past 48 hour(s))  Basic metabolic panel     Status: Abnormal   Collection Time: 03/16/19 12:26 PM  Result Value Ref Range   Sodium 140 135 - 145 mmol/L   Potassium 3.8 3.5 - 5.1 mmol/L   Chloride 97 (L) 98 - 111 mmol/L   CO2 27 22 - 32 mmol/L   Glucose, Bld 107 (H) 70 - 99 mg/dL   BUN <5 (L) 6 - 20 mg/dL   Creatinine, Ser 0.71 0.61 - 1.24 mg/dL   Calcium 9.1 8.9 - 10.3 mg/dL   GFR calc non Af Amer >60 >60 mL/min   GFR calc Af Amer >60 >60 mL/min   Anion gap 16 (H) 5 - 15    Comment: Performed at [facility], [address] [place], [place] [address]  CBC     Status: Abnormal   Collection Time: 03/16/19 12:26 PM  Result Value Ref Range   WBC 5.3 4.0 - 10.5 K/uL  RBC 4.46 4.22 - 5.81 MIL/uL   Hemoglobin 15.0 13.0 - 17.0 g/dL   HCT 44.9 39.0 - 52.0 %   MCV 100.7 (H) 80.0 - 100.0 fL   MCH 33.6 26.0 - 34.0 pg   MCHC 33.4 30.0 - 36.0 g/dL   RDW 15.3 11.5 - 15.5 %   Platelets 545 (H) 150 - 400 K/uL   nRBC 0.0 0.0 - 0.2 %    Comment: Performed at [facility] [address], [place], [place] [address]  Troponin I (High Sensitivity)     Status: None   Collection Time: 03/16/19 12:26 PM  Result Value Ref Range   Troponin I (High Sensitivity) 5 <18 ng/L    Comment: (NOTE) Elevated high sensitivity troponin I (hsTnI) values and significant  changes across serial measurements may suggest ACS but many other  chronic and acute conditions are known to elevate hsTnI results.  Refer to the Links section for chest pain algorithms and additional  guidance. Performed at [facility], [place] [address], [place], [place] [address]   SARS Coronavirus 2 (CEPHEID - Performed in [facility] lab), Hosp Order     Status: None   Collection Time: 03/16/19  3:41 PM   Specimen: Nasopharyngeal Swab  Result Value Ref Range   SARS Coronavirus 2 NEGATIVE NEGATIVE    Comment: (NOTE) If result is NEGATIVE SARS-CoV-2 target nucleic acids are NOT DETECTED. The SARS-CoV-2 RNA is generally detectable in upper and lower  respiratory specimens during the acute phase of infection. The lowest  concentration of SARS-CoV-2 viral copies this assay can detect is 250  copies / mL. A negative result does not preclude SARS-CoV-2 infection  and should not be used as the sole basis for treatment or other  patient management decisions.  A negative result may occur with  improper specimen collection / handling, submission of specimen other  than nasopharyngeal swab, presence of viral mutation(s) within the  areas targeted by this assay, and inadequate number of viral copies  (<250 copies / mL). A negative result must be combined with clinical  observations, patient history, and epidemiological information. If result is POSITIVE SARS-CoV-2 target nucleic acids are DETECTED. The SARS-CoV-2 RNA is generally detectable in upper and lower  respiratory specimens dur ing the acute phase of infection.  Positive  results are indicative of active infection with SARS-CoV-2.  Clinical  correlation with patient history and other diagnostic information is  necessary to determine patient infection status.  Positive results do  not rule out bacterial infection or co-infection with other viruses. If result is PRESUMPTIVE POSTIVE SARS-CoV-2 nucleic acids MAY BE PRESENT.   A presumptive positive result was obtained on the submitted specimen  and confirmed on repeat testing.  While 2019 novel coronavirus  (SARS-CoV-2) nucleic acids may be present in the submitted sample  additional confirmatory testing may be necessary for  epidemiological  and / or clinical management purposes  to differentiate between  SARS-CoV-2 and other Sarbecovirus currently known to infect humans.  If clinically indicated additional testing with an alternate test  methodology [phone]) is advised. The SARS-CoV-2 RNA is generally  detectable in upper and lower respiratory sp ecimens during the acute  phase of infection. The expected result is Negative. Fact Sheet for Patients:  StrictlyIdeas.no Fact Sheet for Healthcare Providers: BankingDealers.co.za This test is not yet approved or cleared by the [place] FDA and has been authorized for detection and/or diagnosis of SARS-CoV-2 by FDA under an  Emergency Use Authorization (EUA).  This EUA will remain in effect (meaning this test can be used) for the duration of the COVID-19 declaration under Section 564(b)(1) of the Act, 21 U.S.C. section 360bbb-3(b)(1), unless the authorization is terminated or revoked sooner. Performed at [facility], [place] [address], [place], [place] [address]   Ethanol     Status: Abnormal   Collection Time: 03/16/19  4:21 PM  Result Value Ref Range   Alcohol, Ethyl (B) 83 (H) <10 mg/dL    Comment: (NOTE) Lowest detectable limit for serum alcohol is 10 mg/dL. For medical purposes only. Performed at [facility], [place] [address], [place], [place] [address]   CK     Status: None   Collection Time: 03/16/19  4:21 PM  Result Value Ref Range   Total CK 122 49 - 397 U/L    Comment: Performed at [facility], [place] [address] [place], [place] [address]  Troponin I (High Sensitivity)     Status: None   Collection Time: 03/16/19  4:33 PM  Result Value Ref Range   Troponin I (High Sensitivity) 15 <18 ng/L    Comment: (NOTE) Elevated high sensitivity troponin I (hsTnI) values and significant  changes across serial measurements may suggest ACS but many other  chronic and acute conditions are  known to elevate hsTnI results.  Refer to the "Links" section for chest pain algorithms and additional  guidance. Performed at [facility], [place] [address], [place], [place] [address]    Dg Chest 2 View  Result Date: 03/16/2019 CLINICAL DATA:  Chest pain EXAM: CHEST - 2 VIEW COMPARISON:  March 03, 2019 FINDINGS: There is no edema or consolidation. Heart size and pulmonary vascularity are normal. No adenopathy. No pneumothorax. No bone lesions. IMPRESSION: No edema or consolidation. Electronically Signed   By: Lowella Grip III M.D.   On: 03/16/2019 13:01    Pending Labs Unresulted Labs (From admission, onward)    Start     Ordered   03/17/19 0500  Hepatic function panel  Tomorrow morning,   R     03/16/19 1933          Vitals/Pain Today's Vitals   03/16/19 1615 03/16/19 1745 03/16/19 1951 03/16/19 1952  BP: (!) 146/99 (!) 141/91  (!) 151/91  Pulse: 87 71  (!) 109  Resp: 18 18    Temp:      TempSrc:      SpO2: 98% 98%    PainSc:   0-No pain     Isolation Precautions No active isolations  Medications Medications  sodium chloride flush (NS) 0.9 % injection 3 mL (3 mLs Intravenous Not Given 03/16/19 1601)  allopurinol (ZYLOPRIM) tablet 200 mg (has no administration in time range)  amLODipine (NORVASC) tablet 10 mg (has no administration in time range)  metoprolol tartrate (LOPRESSOR) tablet 12.5 mg (has no administration in time range)  nicotine (NICODERM CQ - dosed in mg/24 hours) patch 21 mg (has no administration in time range)  sertraline (ZOLOFT) tablet 75 mg (has no administration in time range)  pantoprazole (PROTONIX) EC tablet 40 mg (has no administration in time range)  enoxaparin (LOVENOX) injection 40 mg (has no administration in time range)  LORazepam (ATIVAN) injection 2-3 mg (has no administration in time range)  folic acid (FOLVITE) tablet 1 mg (has no administration in time range)  multivitamin with minerals tablet 1 tablet (has no administration  in time range)  thiamine (VITAMIN B-1)  tablet 100 mg (has no administration in time range)  ondansetron (ZOFRAN) injection 4 mg (has no administration in time range)  sodium chloride 0.9 % bolus 1,000 mL (1,000 mLs Intravenous New Bag/Given 03/16/19 1606)    Mobility walks Low fall risk   Focused Assessments Cardiac Assessment Handoff:  Cardiac Rhythm: Normal sinus rhythm Lab Results  Component Value Date   CKTOTAL 122 03/16/2019   CKMB <1.0 02/10/2014   TROPONINI <0.03 06/01/2018   Lab Results  Component Value Date   DDIMER 0.86 (H) 06/01/2018   Does the Patient currently have chest pain? No     R Recommendations: See Admitting Provider Note  Report given to:   Additional Notes: ciwa protocol

## 2019-03-16 NOTE — ED Provider Notes (Signed)
[facility] EMERGENCY DEPARTMENT Provider Note   CSN: [id number] Arrival date & time: 03/16/19  1216     History   Chief Complaint Chief Complaint  Patient presents with  . Chest Pain  . Alcohol Problem    HPI [name] is a 48 y.o. male.     HPI   48 year old male history of alcohol abuse, hypertension, recent admission for altered mental status with DTs, rhabdomyolysis and kidney injury presents today complaining of chest pain and alcohol withdrawal.  He reports he has not been taking medications since discharge as he did not have them.  He reports that he last drank sometime this morning and feels that he is having some withdrawal symptoms.  He also is complaining of some sharp left chest pain that awoke him this morning.  It is sharp, constant, there is some associated left hand pain, some dyspnea with some coughing.  He does not have a fever but may have had some chills.  He reports cough has been productive of some brownish sputum.  He vomited yesterday which he states had beer in it.  He denies any headache, head injury, abdominal pain, or diarrhea.   Past Medical History:  Diagnosis Date  . Alcohol abuse   . Anxiety    at age 6  . Depression    at age 55  . Psychosis ([place])   . PTSD (post-traumatic stress disorder)   . Schizoaffective disorder   . Seizure disorder ([place])    related to etoh seizure    Patient Active Problem List   Diagnosis Date Noted  . Rhabdomyolysis 03/06/2019  . Alcoholic hepatitis without ascites 03/06/2019  . Alcohol withdrawal ([place]) 01/15/2019  . HTN (hypertension) 01/10/2019  . Fall 01/10/2019  . Alcohol intoxication ([place]) 01/10/2019  . Scalp laceration   . Schizoaffective disorder, bipolar type ([place]) 06/02/2018  . Low back pain 08/21/2016  . Alcohol dependence with uncomplicated withdrawal ([place]) 02/22/2016  . Alcohol-induced mood disorder ([place]) 02/22/2016  . Gout 10/22/2015  . GERD (gastroesophageal reflux  disease) 10/22/2015  . Alcohol withdrawal seizure with complication ([place]) 78/67/6720  . Schizoaffective disorder, depressive type ([place])   . History of alcohol abuse   . Tobacco dependence due to cigarettes 11/20/2014  . Alcohol abuse with intoxication ([place])   . Hyponatremia 10/18/2014  . Abnormal LFTs   . Alcohol abuse 02/09/2014  . Post traumatic stress disorder (PTSD) 11/03/2011  . Hypokalemia 07/02/2011    Past Surgical History:  Procedure Laterality Date  . FOOT SURGERY     right  . HAND SURGERY          Home Medications    Prior to Admission medications   Medication Sig Start Date End Date Taking? Authorizing Provider  allopurinol (ZYLOPRIM) 100 MG tablet Take 2 tablets (200 mg total) by mouth daily. 01/26/19 04/26/19  Scot Jun, FNP  amLODipine (NORVASC) 10 MG tablet Take 1 tablet (10 mg total) by mouth daily. 01/26/19 05/26/19  Scot Jun, FNP  cyclobenzaprine (FLEXERIL) 5 MG tablet Take 1 tablet (5 mg total) by mouth 3 (three) times daily as needed for muscle spasms. 01/26/19   Scot Jun, FNP  folic acid (FOLVITE) 1 MG tablet Take 1 tablet (1 mg total) by mouth daily. 03/09/19 05/08/19  Amin, Jeanella Flattery, MD  metoprolol tartrate (LOPRESSOR) 25 MG tablet Take 0.5 tablets (12.5 mg total) by mouth 2 (two) times daily. 01/26/19   Scot Jun, FNP  Multiple Vitamin (MULTIVITAMIN WITH  MINERALS) TABS tablet Take 1 tablet by mouth daily. 03/09/19 05/08/19  Amin, Jeanella Flattery, MD  nicotine (NICODERM CQ - DOSED IN MG/24 HOURS) 21 mg/24hr patch Place 1 patch (21 mg total) onto the skin daily. 03/09/19   Amin, Jeanella Flattery, MD  pantoprazole (PROTONIX) 40 MG tablet Take 1 tablet (40 mg total) by mouth daily. 01/26/19   Scot Jun, FNP  sertraline (ZOLOFT) 50 MG tablet Take 75 mg by mouth daily.  12/14/18   [provider]  thiamine 100 MG tablet Take 1 tablet (100 mg total) by mouth daily. 03/09/19 05/08/19  Damita Lack, MD    Family History  Family History  Problem Relation Age of Onset  . Alcoholism Mother   . CAD Father   . Depression Brother     Social History Social History   Tobacco Use  . Smoking status: Current Every Day Smoker    Packs/day: 1.00    Years: 24.00    Pack years: 24.00    Types: Cigarettes  . Smokeless tobacco: Never Used  Substance Use Topics  . Alcohol use: Yes    Alcohol/week: 0.0 standard drinks    Frequency: Never    Comment: drinking daily   . Drug use: Yes    Frequency: 1.0 times per week    Types: Marijuana    Comment: THC 2 to 3 times per month. last use 09/24/2015     Allergies   Wellbutrin [bupropion]   Review of Systems Review of Systems  All other systems reviewed and are negative.    Physical Exam Updated Vital Signs BP (!) 129/98 (BP Location: Right Arm)   Pulse 97   Temp 98.4 F (36.9 C) (Oral)   Resp 14   SpO2 96%   Physical Exam Vitals signs and nursing note reviewed.  Constitutional:      General: He is not in acute distress.    Appearance: He is well-developed. He is obese. He is not ill-appearing.     Comments: Unkempt  HENT:     Head: Normocephalic.  Eyes:     Pupils: Pupils are equal, round, and reactive to light.  Neck:     Musculoskeletal: Normal range of motion.  Cardiovascular:     Rate and Rhythm: Normal rate and regular rhythm.     Heart sounds: Normal heart sounds.  Pulmonary:     Effort: Pulmonary effort is normal.     Breath sounds: Normal breath sounds.  Chest:     Chest wall: No mass, deformity, tenderness or crepitus.  Abdominal:     General: Bowel sounds are normal.     Palpations: Abdomen is soft.     Comments: Contusions noted on lower abdominal wall consistent with anticoagulant drug  Musculoskeletal: Normal range of motion.  Skin:    General: Skin is warm and dry.     Capillary Refill: Capillary refill takes less than 2 seconds.  Neurological:     General: No focal deficit present.     Mental Status: He is alert.   Psychiatric:        Mood and Affect: Mood normal.      ED Treatments / Results  Labs (all labs ordered are listed, but only abnormal results are displayed) Labs Reviewed  BASIC METABOLIC PANEL - Abnormal; Notable for the following components:      Result Value   Chloride 97 (*)    Glucose, Bld 107 (*)    BUN <5 (*)    Anion  gap 16 (*)    All other components within normal limits  CBC - Abnormal; Notable for the following components:   MCV 100.7 (*)    Platelets 545 (*)    All other components within normal limits  TROPONIN I (HIGH SENSITIVITY)  TROPONIN I (HIGH SENSITIVITY)    EKG EKG Interpretation  Date/Time:  Wednesday March 16 2019 12:19:54 EDT Ventricular Rate:  105 PR Interval:  144 QRS Duration: 74 QT Interval:  336 QTC Calculation: 444 R Axis:   92 Text Interpretation:  Sinus tachycardia Rightward axis Borderline ECG Confirmed by Pattricia Boss [phone]) on 03/16/2019 3:40:26 PM Also confirmed by Pattricia Boss [phone])  on 03/16/2019 6:29:21 PM   Radiology Dg Chest 2 View  Result Date: 03/16/2019 CLINICAL DATA:  Chest pain EXAM: CHEST - 2 VIEW COMPARISON:  March 03, 2019 FINDINGS: There is no edema or consolidation. Heart size and pulmonary vascularity are normal. No adenopathy. No pneumothorax. No bone lesions. IMPRESSION: No edema or consolidation. Electronically Signed   By: Lowella Grip III M.D.   On: 03/16/2019 13:01    Procedures .Critical Care Performed by: Pattricia Boss, MD Authorized by: Pattricia Boss, MD   Critical care provider statement:    Critical care time (minutes):  45   Critical care was necessary to treat or prevent imminent or life-threatening deterioration of the following conditions:  CNS failure or compromise   Critical care was time spent personally by me on the following activities:  Discussions with consultants, evaluation of patient's response to treatment, examination of patient, ordering and performing treatments and  interventions, ordering and review of laboratory studies, ordering and review of radiographic studies, pulse oximetry, re-evaluation of patient's condition, obtaining history from patient or surrogate and review of old charts   (including critical care time)  Medications Ordered in ED Medications  sodium chloride flush (NS) 0.9 % injection 3 mL (has no administration in time range)     Initial Impression / Assessment and Plan / ED Course  I have reviewed the triage vital signs and the nursing notes.  Pertinent labs & imaging results that were available during my care of the patient were reviewed by me and considered in my medical decision making (see chart for details).      1- chest pain-no acute st elevation, first troponin normal, atypical pain 2- alcohol withdrawal- patient with recent admission for dts, stopped drinking this am- states trying to stop, some hypertension here, slight tremors,mild tachycardia awake and alert- will need to continue ciwa while ruling out. Discussed with Dr. Marlowe Sax a who will see for admission  Final Clinical Impressions(s) / ED Diagnoses   Final diagnoses:  Alcohol withdrawal syndrome with complication [facility])  Chest pain, unspecified type    ED Discharge Orders    None       Pattricia Boss, MD 03/16/19 1926

## 2019-03-16 NOTE — ED Notes (Signed)
Attempted to call report. Nurse unavailable and will call back for report

## 2019-03-16 NOTE — ED Triage Notes (Signed)
Pt arrived by [facility] for midsternal cp this am. Was recently hospitalized for alcohol withdrawal and seizures. Last drank last night. Tremors noted by ems, iv fluids given with zofran 4mg  ivp.

## 2019-03-16 NOTE — H&P (Signed)
History and Physical    [name] BJS:[id number] DOB: 01-04-1971 DOA: 03/16/2019  PCP: Scot Jun, FNP Patient coming from: Home  Chief Complaint: Chest pain, alcohol withdrawal  HPI: [name] is a 48 y.o. male with medical history significant of hypertension, chronic alcohol abuse, seizures, PTSD, GERD, tobacco use, gout, depression, anxiety presenting to the hospital via EMS for evaluation of chest pain and alcohol withdrawal.  Patient states since his recent hospital discharge he went back to drinking a case of beer per day.  About 2 AM this morning was his last drink and then he decided stop drinking.  This morning he experienced nonradiating substernal chest pain/tightness which woke him up from his sleep.  States his chest pain has been ongoing for the entire day.  He has also been feeling short of breath and nauseous the entire day.  He has not vomited and has been eating okay.  He is also feeling jittery.  No other complaints.  Patient was recently admitted to the hospital from 7/15 to 7/21 for severe alcohol withdrawal which required ICU admission and Precedex drip.  Work-up also revealed rhabdomyolysis and alcoholic hepatitis.   ED Course: Mildly tachycardic. Remainder of vitals stable.  High-sensitivity troponin checked twice negative.  EKG not suggestive of ACS.  Blood ethanol level 83.  CK normal.  COVID-19 rapid test negative.  Chest x-ray personally reviewed and showing no edema or consolidation.  Patient received Ativan, thiamine, and 1 L fluid bolus in the ED.  Review of Systems:  All systems reviewed and apart from history of presenting illness, are negative.  Past Medical History:  Diagnosis Date  . Alcohol abuse   . Anxiety    at age 51  . Depression    at age 63  . Psychosis ([place])   . PTSD (post-traumatic stress disorder)   . Schizoaffective disorder   . Seizure disorder ([place])    related to etoh seizure    Past Surgical History:  Procedure  Laterality Date  . FOOT SURGERY     right  . HAND SURGERY       reports that he has been smoking cigarettes. He has a 24.00 pack-year smoking history. He has never used smokeless tobacco. He reports current alcohol use. He reports current drug use. Frequency: 1.00 time per week. Drug: Marijuana.  Allergies  Allergen Reactions  . Wellbutrin [Bupropion] Other (See Comments)    Caused seizures    Family History  Problem Relation Age of Onset  . Alcoholism Mother   . CAD Father   . Depression Brother     Prior to Admission medications   Medication Sig Start Date End Date Taking? Authorizing Provider  allopurinol (ZYLOPRIM) 100 MG tablet Take 2 tablets (200 mg total) by mouth daily. 01/26/19 04/26/19 Yes Scot Jun, FNP  aspirin 81 MG chewable tablet Chew 324 mg by mouth as needed (for sudden onset of chest pain).   Yes [provider]  metoprolol tartrate (LOPRESSOR) 25 MG tablet Take 0.5 tablets (12.5 mg total) by mouth 2 (two) times daily. 01/26/19  Yes Scot Jun, FNP  pantoprazole (PROTONIX) 40 MG tablet Take 1 tablet (40 mg total) by mouth daily. 01/26/19  Yes Scot Jun, FNP  amLODipine (NORVASC) 10 MG tablet Take 1 tablet (10 mg total) by mouth daily. 01/26/19 05/26/19  Scot Jun, FNP  cyclobenzaprine (FLEXERIL) 5 MG tablet Take 1 tablet (5 mg total) by mouth 3 (three) times daily as needed for muscle  spasms. Patient not taking: Reported on 03/16/2019 01/26/19   Scot Jun, FNP  folic acid (FOLVITE) 1 MG tablet Take 1 tablet (1 mg total) by mouth daily. 03/09/19 05/08/19  Damita Lack, MD  Multiple Vitamin (MULTIVITAMIN WITH MINERALS) TABS tablet Take 1 tablet by mouth daily. 03/09/19 05/08/19  Amin, Jeanella Flattery, MD  nicotine (NICODERM CQ - DOSED IN MG/24 HOURS) 21 mg/24hr patch Place 1 patch (21 mg total) onto the skin daily. Patient not taking: Reported on 03/16/2019 03/09/19   Damita Lack, MD  sertraline (ZOLOFT) 50 MG tablet  Take 75 mg by mouth daily.  12/14/18   [provider]  thiamine 100 MG tablet Take 1 tablet (100 mg total) by mouth daily. 03/09/19 05/08/19  Damita Lack, MD    Physical Exam: Vitals:   03/16/19 1530 03/16/19 1615 03/16/19 1745 03/16/19 1952  BP: (!) 151/103 (!) 146/99 (!) 141/91 (!) 151/91  Pulse: 89 87 71 (!) 109  Resp: 19 18 18    Temp:      TempSrc:      SpO2: 98% 98% 98%     Physical Exam  Constitutional: He is oriented to person, place, and time. He appears well-developed and well-nourished. No distress.  Slightly tremulous  HENT:  Head: Normocephalic.  Mouth/Throat: Oropharynx is clear and moist.  Eyes: Right eye exhibits no discharge. Left eye exhibits no discharge.  Neck: Neck supple.  Cardiovascular: Regular rhythm and intact distal pulses.  Slightly tachycardic  Pulmonary/Chest: Effort normal. No respiratory distress. He has no wheezes. He has no rales.  Abdominal: Soft. Bowel sounds are normal. He exhibits no distension. There is no abdominal tenderness. There is no guarding.  Musculoskeletal:        General: No edema.  Neurological: He is alert and oriented to person, place, and time.  Skin: Skin is warm and dry. He is not diaphoretic.     Labs on Admission: I have personally reviewed following labs and imaging studies  CBC: Recent Labs  Lab 03/16/19 1226  WBC 5.3  HGB 15.0  HCT 44.9  MCV 100.7*  PLT 536*   Basic Metabolic Panel: Recent Labs  Lab 03/16/19 1226  NA 140  K 3.8  CL 97*  CO2 27  GLUCOSE 107*  BUN <5*  CREATININE 0.71  CALCIUM 9.1   GFR: Estimated Creatinine Clearance: 138.9 mL/min (by C-G formula based on SCr of 0.71 mg/dL). Liver Function Tests: No results for input(s): AST, ALT, ALKPHOS, BILITOT, PROT, ALBUMIN in the last 168 hours. No results for input(s): LIPASE, AMYLASE in the last 168 hours. No results for input(s): AMMONIA in the last 168 hours. Coagulation Profile: No results for input(s): INR, PROTIME in  the last 168 hours. Cardiac Enzymes: Recent Labs  Lab 03/16/19 1621  CKTOTAL 122   BNP (last 3 results) No results for input(s): PROBNP in the last 8760 hours. HbA1C: No results for input(s): HGBA1C in the last 72 hours. CBG: No results for input(s): GLUCAP in the last 168 hours. Lipid Profile: No results for input(s): CHOL, HDL, LDLCALC, TRIG, CHOLHDL, LDLDIRECT in the last 72 hours. Thyroid Function Tests: No results for input(s): TSH, T4TOTAL, FREET4, T3FREE, THYROIDAB in the last 72 hours. Anemia Panel: No results for input(s): VITAMINB12, FOLATE, FERRITIN, TIBC, IRON, RETICCTPCT in the last 72 hours. Urine analysis:    Component Value Date/Time   COLORURINE YELLOW 03/03/2019 [place] 03/03/2019 0843   LABSPEC 1.012 03/03/2019 0843   PHURINE 7.0 03/03/2019  [place] 03/03/2019 [place] (A) 03/03/2019 [place] 03/03/2019 0843   KETONESUR 5 (A) 03/03/2019 0843   PROTEINUR 30 (A) 03/03/2019 0843   UROBILINOGEN 1.0 12/24/2014 0643   NITRITE NEGATIVE 03/03/2019 0843   LEUKOCYTESUR LARGE (A) 03/03/2019 0843    Radiological Exams on Admission: Dg Chest 2 View  Result Date: 03/16/2019 CLINICAL DATA:  Chest pain EXAM: CHEST - 2 VIEW COMPARISON:  March 03, 2019 FINDINGS: There is no edema or consolidation. Heart size and pulmonary vascularity are normal. No adenopathy. No pneumothorax. No bone lesions. IMPRESSION: No edema or consolidation. Electronically Signed   By: Lowella Grip III M.D.   On: 03/16/2019 13:01    EKG: Independently reviewed.  Sinus tachycardia, artifact.  Assessment/Plan Principal Problem:   Alcohol withdrawal (HCC) Active Problems:   Tobacco dependence due to cigarettes   Gout   HTN (hypertension)   Chest pain   Acute alcohol withdrawal Currently displaying mild signs of alcohol withdrawal.  Slightly tachycardic and tremulous.  However, patient is at high risk for severe alcohol  withdrawal given recent admission which required ICU stay and Precedex drip. -Monitor very closely in the progressive care unit -CIWA protocol; Ativan 2 to 3 mg every 1 hour PRN -Thiamine, folate, multivitamin -Check LFTs  Atypical chest pain Chest pain is present at rest and has been ongoing the entire day.  No documented history of CAD. High-sensitivity troponin checked twice negative.  EKG not suggestive of ACS.  Appears comfortable on exam. -Cardiac monitoring -Continue to monitor  Thrombocytosis Platelet count 545,000.  Does smoke cigarettes but platelet count does not seem to be consistently elevated in the past. -Repeat CBC in a.m. to confirm  Gout -Continue home allopurinol  Hypertension -Continue home amlodipine, metoprolol  Tobacco use -NicoDerm patch  GERD -PPI  Depression, anxiety -Continue Zoloft  DVT prophylaxis: Lovenox Code Status: Full code Family Communication: No family available at this time. Disposition Plan: Anticipate discharge after clinical improvement. Consults called: None Admission status: It is my clinical opinion that admission to INPATIENT is reasonable and necessary in this 48 y.o. male . presenting with symptoms of acute alcohol withdrawal.  Currently displaying mild signs of alcohol withdrawal, however, he is at high risk for severe alcohol withdrawal given recent admission which required ICU stay and Precedex drip.  Patient needs to be monitored very closely in the progressive care unit over the course of the next few days.  High risk of decompensation.  Given the aforementioned, the predictability of an adverse outcome is felt to be significant. I expect that the patient will require at least 2 midnights in the hospital to treat this condition.   The medical decision making on this patient was of high complexity and the patient is at high risk for clinical deterioration, therefore this is a level 3 visit.  Shela Leff MD [facility] Pager [phone]  If 7PM-7AM, please contact night-coverage [email or web address] Password [facility]  03/16/2019, 8:48 PM

## 2019-03-16 NOTE — ED Notes (Signed)
Attempted to call report again to 5W. Unable to reach nurse

## 2019-03-17 DIAGNOSIS — I10 Essential (primary) hypertension: Secondary | ICD-10-CM

## 2019-03-17 DIAGNOSIS — R079 Chest pain, unspecified: Secondary | ICD-10-CM

## 2019-03-17 DIAGNOSIS — M1A9XX0 Chronic gout, unspecified, without tophus (tophi): Secondary | ICD-10-CM

## 2019-03-17 LAB — HEPATIC FUNCTION PANEL
ALT: 56 U/L — ABNORMAL HIGH (ref 0–44)
AST: 53 U/L — ABNORMAL HIGH (ref 15–41)
Albumin: 3.1 g/dL — ABNORMAL LOW (ref 3.5–5.0)
Alkaline Phosphatase: 121 U/L (ref 38–126)
Bilirubin, Direct: 0.3 mg/dL — ABNORMAL HIGH (ref 0.0–0.2)
Indirect Bilirubin: 0.5 mg/dL (ref 0.3–0.9)
Total Bilirubin: 0.8 mg/dL (ref 0.3–1.2)
Total Protein: 6.1 g/dL — ABNORMAL LOW (ref 6.5–8.1)

## 2019-03-17 LAB — MRSA PCR SCREENING: MRSA by PCR: NEGATIVE

## 2019-03-17 NOTE — Progress Notes (Signed)
Patients CIWA has been consecutively been above 10 since 8am. Paged Starla Link, MD per order. Will continue to monitor.

## 2019-03-17 NOTE — Progress Notes (Signed)
Patient ID: [name], male   DOB: [birth year], 48 y.o.   MRN: [id number]  PROGRESS NOTE    [name]  UVO:[id number] DOB: 05/23/1971 DOA: 03/16/2019 PCP: Scot Jun, FNP   Brief Narrative:  48 year old male with history of hypertension, chronic alcohol abuse, seizures, PTSD, GERD, tobacco abuse, gout, depression, anxiety, recent hospitalization from 03/02/2019-03/08/2019 for alcohol withdrawal which required ICU admission and Precedex drip presented on 03/16/2019 for chest pain and alcohol withdrawal.  Initial COVID-19 testing was negative.  Blood alcohol level was 83.  High-sensitivity troponin was negative x2.  Patient was started on IV fluids and treated with IV Ativan.  Assessment & Plan:   Alcohol withdrawal in a patient with chronic alcohol abuse  -Had recent hospitalization from 03/02/2019-03/08/2019 for alcohol withdrawal which required ICU stay and Precedex drip -Continue CIWA protocol with Ativan.  Continue thiamine, folate and multivitamins. -Still tremulous with intermittent tachycardia.  No current hallucinations. -Social worker consult for providing outpatient resources   Atypical chest pain -High-sensitivity troponin x2-.  EKG not suggestive of ACS.  Currently no chest pain.  Thrombocytosis -Probably reactive.  Monitor  Elevated LFTs -Probably from alcohol abuse.  Improving.  Monitor  Gout -Continue allopurinol  Hypertension -Continue metoprolol and amlodipine.  Blood pressure stable  Tobacco use -Continue nicotine patch.  Patient will need to stop smoking  GERD -Continue PPI  Depression, anxiety -Continue Zoloft  DVT prophylaxis: Lovenox Code Status: Full Family Communication: None at bedside Disposition Plan: Home in 1 to 2 days if clinically improves  Consultants: None  Procedures: None  Antimicrobials: None   Subjective: Patient seen and examined at bedside.  He is a poor historian.  Feels slightly better but still feels tremulous.   No overnight fever or vomiting reported.  Objective: Vitals:   03/17/19 0410 03/17/19 0700 03/17/19 0757 03/17/19 0812  BP:  125/86  (!) 137/94  Pulse:  92 (!) 103 99  Resp:  (!) 22 15   Temp: 99.3 F (37.4 C)   98.6 F (37 C)  TempSrc: Oral   Oral  SpO2:  93% 95%     Intake/Output Summary (Last 24 hours) at 03/17/2019 1112 Last data filed at 03/17/2019 0900 Gross per 24 hour  Intake 480 ml  Output 550 ml  Net -70 ml   There were no vitals filed for this visit.  Examination:  General exam: Appears calm and comfortable.  Poor historian.  Slightly tremulous intermittently Respiratory system: Bilateral decreased breath sounds at bases Cardiovascular system: S1 & S2 heard, intermittently tachycardic Gastrointestinal system: Abdomen is nondistended, soft and nontender. Normal bowel sounds heard. Extremities: No cyanosis, clubbing, edema     Data Reviewed: I have personally reviewed following labs and imaging studies  CBC: Recent Labs  Lab 03/16/19 1226  WBC 5.3  HGB 15.0  HCT 44.9  MCV 100.7*  PLT 742*   Basic Metabolic Panel: Recent Labs  Lab 03/16/19 1226  NA 140  K 3.8  CL 97*  CO2 27  GLUCOSE 107*  BUN <5*  CREATININE 0.71  CALCIUM 9.1   GFR: Estimated Creatinine Clearance: 138.9 mL/min (by C-G formula based on SCr of 0.71 mg/dL). Liver Function Tests: Recent Labs  Lab 03/17/19 0244  AST 53*  ALT 56*  ALKPHOS 121  BILITOT 0.8  PROT 6.1*  ALBUMIN 3.1*   No results for input(s): LIPASE, AMYLASE in the last 168 hours. No results for input(s): AMMONIA in the last 168 hours. Coagulation Profile: No results for input(s): INR, PROTIME  in the last 168 hours. Cardiac Enzymes: Recent Labs  Lab 03/16/19 1621  CKTOTAL 122   BNP (last 3 results) No results for input(s): PROBNP in the last 8760 hours. HbA1C: No results for input(s): HGBA1C in the last 72 hours. CBG: No results for input(s): GLUCAP in the last 168 hours. Lipid Profile: No results  for input(s): CHOL, HDL, LDLCALC, TRIG, CHOLHDL, LDLDIRECT in the last 72 hours. Thyroid Function Tests: No results for input(s): TSH, T4TOTAL, FREET4, T3FREE, THYROIDAB in the last 72 hours. Anemia Panel: No results for input(s): VITAMINB12, FOLATE, FERRITIN, TIBC, IRON, RETICCTPCT in the last 72 hours. Sepsis Labs: No results for input(s): PROCALCITON, LATICACIDVEN in the last 168 hours.  Recent Results (from the past 240 hour(s))  SARS Coronavirus 2 (CEPHEID - Performed in [place] hospital lab), Hosp Order     Status: None   Collection Time: 03/16/19  3:41 PM   Specimen: Nasopharyngeal Swab  Result Value Ref Range Status   SARS Coronavirus 2 NEGATIVE NEGATIVE Final    Comment: (NOTE) If result is NEGATIVE SARS-CoV-2 target nucleic acids are NOT DETECTED. The SARS-CoV-2 RNA is generally detectable in upper and lower  respiratory specimens during the acute phase of infection. The lowest  concentration of SARS-CoV-2 viral copies this assay can detect is 250  copies / mL. A negative result does not preclude SARS-CoV-2 infection  and should not be used as the sole basis for treatment or other  patient management decisions.  A negative result may occur with  improper specimen collection / handling, submission of specimen other  than nasopharyngeal swab, presence of viral mutation(s) within the  areas targeted by this assay, and inadequate number of viral copies  (<250 copies / mL). A negative result must be combined with clinical  observations, patient history, and epidemiological information. If result is POSITIVE SARS-CoV-2 target nucleic acids are DETECTED. The SARS-CoV-2 RNA is generally detectable in upper and lower  respiratory specimens dur ing the acute phase of infection.  Positive  results are indicative of active infection with SARS-CoV-2.  Clinical  correlation with patient history and other diagnostic information is  necessary to determine patient infection status.   Positive results do  not rule out bacterial infection or co-infection with other viruses. If result is PRESUMPTIVE POSTIVE SARS-CoV-2 nucleic acids MAY BE PRESENT.   A presumptive positive result was obtained on the submitted specimen  and confirmed on repeat testing.  While 2019 novel coronavirus  (SARS-CoV-2) nucleic acids may be present in the submitted sample  additional confirmatory testing may be necessary for epidemiological  and / or clinical management purposes  to differentiate between  SARS-CoV-2 and other Sarbecovirus currently known to infect humans.  If clinically indicated additional testing with an alternate test  methodology [phone]) is advised. The SARS-CoV-2 RNA is generally  detectable in upper and lower respiratory sp ecimens during the acute  phase of infection. The expected result is Negative. Fact Sheet for Patients:  StrictlyIdeas.no Fact Sheet for Healthcare Providers: BankingDealers.co.za This test is not yet approved or cleared by the [place] FDA and has been authorized for detection and/or diagnosis of SARS-CoV-2 by FDA under an Emergency Use Authorization (EUA).  This EUA will remain in effect (meaning this test can be used) for the duration of the COVID-19 declaration under Section 564(b)(1) of the Act, 21 U.S.C. section 360bbb-3(b)(1), unless the authorization is terminated or revoked sooner. Performed at [facility], [place] [address], [place], [place] [address]  MRSA PCR Screening     Status: None   Collection Time: 03/16/19 11:00 PM   Specimen: Nasal Mucosa; Nasopharyngeal  Result Value Ref Range Status   MRSA by PCR NEGATIVE NEGATIVE Final    Comment:        The GeneXpert MRSA Assay (FDA approved for NASAL specimens only), is one component of a comprehensive MRSA colonization surveillance program. It is not intended to diagnose MRSA infection nor to guide or monitor treatment for  MRSA infections. Performed at [facility], [place] [address] [place], [place] [address]          Radiology Studies: Dg Chest 2 View  Result Date: 03/16/2019 CLINICAL DATA:  Chest pain EXAM: CHEST - 2 VIEW COMPARISON:  March 03, 2019 FINDINGS: There is no edema or consolidation. Heart size and pulmonary vascularity are normal. No adenopathy. No pneumothorax. No bone lesions. IMPRESSION: No edema or consolidation. Electronically Signed   By: Lowella Grip III M.D.   On: 03/16/2019 13:01        Scheduled Meds: . allopurinol  200 mg Oral Daily  . amLODipine  10 mg Oral Daily  . enoxaparin (LOVENOX) injection  40 mg Subcutaneous Q24H  . folic acid  1 mg Oral Daily  . metoprolol tartrate  12.5 mg Oral BID  . multivitamin with minerals  1 tablet Oral Daily  . nicotine  21 mg Transdermal Daily  . pantoprazole  40 mg Oral Daily  . sertraline  75 mg Oral Daily  . sodium chloride flush  3 mL Intravenous Once  . thiamine  100 mg Oral Daily   Continuous Infusions:   LOS: 1 day        Aline August, MD [facility] 03/17/2019, 11:12 AM

## 2019-03-17 NOTE — Progress Notes (Signed)
Patient has required 2mg  of ativan q1h. CIWAs have been ranging from 10-13. Dr. Starla Link notified. Will continue to monitor.

## 2019-03-18 LAB — COMPREHENSIVE METABOLIC PANEL
ALT: 42 U/L (ref 0–44)
AST: 33 U/L (ref 15–41)
Albumin: 3 g/dL — ABNORMAL LOW (ref 3.5–5.0)
Alkaline Phosphatase: 114 U/L (ref 38–126)
Anion gap: 12 (ref 5–15)
BUN: 13 mg/dL (ref 6–20)
CO2: 24 mmol/L (ref 22–32)
Calcium: 8.7 mg/dL — ABNORMAL LOW (ref 8.9–10.3)
Chloride: 97 mmol/L — ABNORMAL LOW (ref 98–111)
Creatinine, Ser: 0.81 mg/dL (ref 0.61–1.24)
GFR calc Af Amer: >60 mL/min (ref >60)
GFR calc non Af Amer: >60 mL/min (ref >60)
Glucose, Bld: 95 mg/dL (ref 70–99)
Potassium: 3.4 mmol/L — ABNORMAL LOW (ref 3.5–5.1)
Sodium: 133 mmol/L — ABNORMAL LOW (ref 135–145)
Total Bilirubin: 0.7 mg/dL (ref 0.3–1.2)
Total Protein: 5.9 g/dL — ABNORMAL LOW (ref 6.5–8.1)

## 2019-03-18 LAB — CBC WITH DIFFERENTIAL/PLATELET
Abs Immature Granulocytes: 0.05 10*3/uL (ref 0.00–0.07)
Basophils Absolute: 0 10*3/uL (ref 0.0–0.1)
Basophils Relative: 1 %
Eosinophils Absolute: 0 10*3/uL (ref 0.0–0.5)
Eosinophils Relative: 1 %
HCT: 39.1 % (ref 39.0–52.0)
Hemoglobin: 13.1 g/dL (ref 13.0–17.0)
Immature Granulocytes: 1 %
Lymphocytes Relative: 14 %
Lymphs Abs: 1.2 10*3/uL (ref 0.7–4.0)
MCH: 33.4 pg (ref 26.0–34.0)
MCHC: 33.5 g/dL (ref 30.0–36.0)
MCV: 99.7 fL (ref 80.0–100.0)
Monocytes Absolute: 0.5 10*3/uL (ref 0.1–1.0)
Monocytes Relative: 5 %
Neutro Abs: 6.8 10*3/uL (ref 1.7–7.7)
Neutrophils Relative %: 78 %
Platelets: 354 10*3/uL (ref 150–400)
RBC: 3.92 MIL/uL — ABNORMAL LOW (ref 4.22–5.81)
RDW: 14.4 % (ref 11.5–15.5)
WBC: 8.6 10*3/uL (ref 4.0–10.5)
nRBC: 0 % (ref 0.0–0.2)

## 2019-03-18 LAB — MAGNESIUM: Magnesium: 1.8 mg/dL (ref 1.7–2.4)

## 2019-03-18 MED ORDER — CHLORDIAZEPOXIDE HCL 25 MG PO CAPS
25.0000 mg | ORAL_CAPSULE | Freq: Four times a day (QID) | ORAL | Status: DC
  Administered 2019-03-18 – 2019-03-19 (×3): 25 mg via ORAL
  Filled 2019-03-18 (×3): qty 1

## 2019-03-18 MED ORDER — LORAZEPAM 2 MG/ML IJ SOLN: 1.0000 mg | INTRAMUSCULAR | Status: DC | PRN

## 2019-03-18 MED ORDER — LORAZEPAM 2 MG/ML IJ SOLN
1.0000 mg | Freq: Four times a day (QID) | INTRAMUSCULAR | Status: DC | PRN
  Administered 2019-03-18 (×2): 1 mg via INTRAVENOUS
  Filled 2019-03-18 (×2): qty 1

## 2019-03-18 MED ORDER — LORAZEPAM 2 MG/ML IJ SOLN
1.0000 mg | INTRAMUSCULAR | Status: DC | PRN
  Administered 2019-03-18 – 2019-03-19 (×4): 2 mg via INTRAVENOUS
  Filled 2019-03-18 (×4): qty 1

## 2019-03-18 MED ORDER — LORAZEPAM 1 MG PO TABS: 1.0000 mg | ORAL_TABLET | ORAL | Status: DC | PRN

## 2019-03-18 MED ORDER — LORAZEPAM 1 MG PO TABS: 1.0000 mg | ORAL_TABLET | Freq: Four times a day (QID) | ORAL | Status: DC | PRN

## 2019-03-18 MED ORDER — LORAZEPAM 2 MG/ML IJ SOLN
1.0000 mg | INTRAMUSCULAR | Status: DC | PRN
  Administered 2019-03-18: 2 mg via INTRAVENOUS
  Filled 2019-03-18: qty 1

## 2019-03-18 MED ORDER — NAPROXEN 250 MG PO TABS
375.0000 mg | ORAL_TABLET | Freq: Once | ORAL | Status: AC
  Administered 2019-03-18: 375 mg via ORAL
  Filled 2019-03-18: qty 2

## 2019-03-18 MED ORDER — POTASSIUM CHLORIDE CRYS ER 20 MEQ PO TBCR
40.0000 meq | EXTENDED_RELEASE_TABLET | Freq: Once | ORAL | Status: AC
  Administered 2019-03-18: 40 meq via ORAL
  Filled 2019-03-18: qty 2

## 2019-03-18 NOTE — Progress Notes (Signed)
Patient ID: [name], male   DOB: [birth year], 48 y.o.   MRN: [id number]  PROGRESS NOTE    [name]  NKN:[id number] DOB: 1971-06-23 DOA: 03/16/2019 PCP: Scot Jun, FNP   Brief Narrative:  48 year old male with history of hypertension, chronic alcohol abuse, seizures, PTSD, GERD, tobacco abuse, gout, depression, anxiety, recent hospitalization from 03/02/2019-03/08/2019 for alcohol withdrawal which required ICU admission and Precedex drip presented on 03/16/2019 for chest pain and alcohol withdrawal.  Initial COVID-19 testing was negative.  Blood alcohol level was 83.  High-sensitivity troponin was negative x2.  Patient was started on IV fluids and treated with IV Ativan.  Assessment & Plan:   Alcohol withdrawal in a patient with chronic alcohol abuse  -Had recent hospitalization from 03/02/2019-03/08/2019 for alcohol withdrawal which required ICU stay and Precedex drip -Continue CIWA protocol with Ativan.  Continue thiamine, folate and multivitamins.  Patient was requiring high doses of intravenous Ativan almost every hour yesterday.  Will try and decrease the dose of Ativan and see response. -Still slightly tremulous.  -Social worker consult for providing outpatient resources   Atypical chest pain -High-sensitivity troponin x2-.  EKG not suggestive of ACS.  Currently no chest pain.  Hypokalemia -Replace.  Repeat a.m. labs  Thrombocytosis -Probably reactive.  Monitor  Elevated LFTs -Probably from alcohol abuse.  Improved.  Monitor  Gout -Continue allopurinol  Hypertension -Continue metoprolol and amlodipine.  Blood pressure stable  Tobacco use -Continue nicotine patch.  Patient will need to stop smoking  GERD -Continue PPI  Depression, anxiety -Continue Zoloft  DVT prophylaxis: Lovenox Code Status: Full Family Communication: None at bedside Disposition Plan: Home in 1 to 2 days if clinically improves  Consultants: None  Procedures: None   Antimicrobials: None   Subjective: Patient seen and examined at bedside.  He is a poor historian.  Feels slightly better.  Does not feel ready to go home today.  Still feels tremulous.  No overnight fever, vomiting, chest pain.  Objective: Vitals:   03/18/19 0612 03/18/19 0804 03/18/19 0809 03/18/19 0819  BP:   (!) 132/104   Pulse: 86 98 (!) 54 (!) 103  Resp: (!) 22 18 18    Temp:   98.4 F (36.9 C)   TempSrc:   Oral   SpO2: 92% 94% 96%     Intake/Output Summary (Last 24 hours) at 03/18/2019 0956 Last data filed at 03/18/2019 0256 Gross per 24 hour  Intake 720 ml  Output 1070 ml  Net -350 ml   There were no vitals filed for this visit.  Examination:  General exam: No acute distress.  Poor historian.  Still slightly tremulous  Respiratory system: Bilateral decreased breath sounds at bases.  No wheezing Cardiovascular system: S1 & S2 heard, intermittent tachycardia Gastrointestinal system: Abdomen is nondistended, soft and nontender. Normal bowel sounds heard. Extremities: No cyanosis, edema     Data Reviewed: I have personally reviewed following labs and imaging studies  CBC: Recent Labs  Lab 03/16/19 1226 03/18/19 0500  WBC 5.3 8.6  NEUTROABS  --  6.8  HGB 15.0 13.1  HCT 44.9 39.1  MCV 100.7* 99.7  PLT 545* 379   Basic Metabolic Panel: Recent Labs  Lab 03/16/19 1226 03/18/19 0500  NA 140 133*  K 3.8 3.4*  CL 97* 97*  CO2 27 24  GLUCOSE 107* 95  BUN <5* 13  CREATININE 0.71 0.81  CALCIUM 9.1 8.7*  MG  --  1.8   GFR: Estimated Creatinine Clearance: 137.2 mL/min (by C-G  formula based on SCr of 0.81 mg/dL). Liver Function Tests: Recent Labs  Lab 03/17/19 0244 03/18/19 0500  AST 53* 33  ALT 56* 42  ALKPHOS 121 114  BILITOT 0.8 0.7  PROT 6.1* 5.9*  ALBUMIN 3.1* 3.0*   No results for input(s): LIPASE, AMYLASE in the last 168 hours. No results for input(s): AMMONIA in the last 168 hours. Coagulation Profile: No results for input(s): INR, PROTIME  in the last 168 hours. Cardiac Enzymes: Recent Labs  Lab 03/16/19 1621  CKTOTAL 122   BNP (last 3 results) No results for input(s): PROBNP in the last 8760 hours. HbA1C: No results for input(s): HGBA1C in the last 72 hours. CBG: No results for input(s): GLUCAP in the last 168 hours. Lipid Profile: No results for input(s): CHOL, HDL, LDLCALC, TRIG, CHOLHDL, LDLDIRECT in the last 72 hours. Thyroid Function Tests: No results for input(s): TSH, T4TOTAL, FREET4, T3FREE, THYROIDAB in the last 72 hours. Anemia Panel: No results for input(s): VITAMINB12, FOLATE, FERRITIN, TIBC, IRON, RETICCTPCT in the last 72 hours. Sepsis Labs: No results for input(s): PROCALCITON, LATICACIDVEN in the last 168 hours.  Recent Results (from the past 240 hour(s))  SARS Coronavirus 2 (CEPHEID - Performed in [place] hospital lab), Hosp Order     Status: None   Collection Time: 03/16/19  3:41 PM   Specimen: Nasopharyngeal Swab  Result Value Ref Range Status   SARS Coronavirus 2 NEGATIVE NEGATIVE Final    Comment: (NOTE) If result is NEGATIVE SARS-CoV-2 target nucleic acids are NOT DETECTED. The SARS-CoV-2 RNA is generally detectable in upper and lower  respiratory specimens during the acute phase of infection. The lowest  concentration of SARS-CoV-2 viral copies this assay can detect is 250  copies / mL. A negative result does not preclude SARS-CoV-2 infection  and should not be used as the sole basis for treatment or other  patient management decisions.  A negative result may occur with  improper specimen collection / handling, submission of specimen other  than nasopharyngeal swab, presence of viral mutation(s) within the  areas targeted by this assay, and inadequate number of viral copies  (<250 copies / mL). A negative result must be combined with clinical  observations, patient history, and epidemiological information. If result is POSITIVE SARS-CoV-2 target nucleic acids are DETECTED. The  SARS-CoV-2 RNA is generally detectable in upper and lower  respiratory specimens dur ing the acute phase of infection.  Positive  results are indicative of active infection with SARS-CoV-2.  Clinical  correlation with patient history and other diagnostic information is  necessary to determine patient infection status.  Positive results do  not rule out bacterial infection or co-infection with other viruses. If result is PRESUMPTIVE POSTIVE SARS-CoV-2 nucleic acids MAY BE PRESENT.   A presumptive positive result was obtained on the submitted specimen  and confirmed on repeat testing.  While 2019 novel coronavirus  (SARS-CoV-2) nucleic acids may be present in the submitted sample  additional confirmatory testing may be necessary for epidemiological  and / or clinical management purposes  to differentiate between  SARS-CoV-2 and other Sarbecovirus currently known to infect humans.  If clinically indicated additional testing with an alternate test  methodology [phone]) is advised. The SARS-CoV-2 RNA is generally  detectable in upper and lower respiratory sp ecimens during the acute  phase of infection. The expected result is Negative. Fact Sheet for Patients:  StrictlyIdeas.no Fact Sheet for Healthcare Providers: BankingDealers.co.za This test is not yet approved or cleared by the [place]  States FDA and has been authorized for detection and/or diagnosis of SARS-CoV-2 by FDA under an Emergency Use Authorization (EUA).  This EUA will remain in effect (meaning this test can be used) for the duration of the COVID-19 declaration under Section 564(b)(1) of the Act, 21 U.S.C. section 360bbb-3(b)(1), unless the authorization is terminated or revoked sooner. Performed at [facility], [place] [address], [place], [place] [address]   MRSA PCR Screening     Status: None   Collection Time: 03/16/19 11:00 PM   Specimen: Nasal Mucosa; Nasopharyngeal   Result Value Ref Range Status   MRSA by PCR NEGATIVE NEGATIVE Final    Comment:        The GeneXpert MRSA Assay (FDA approved for NASAL specimens only), is one component of a comprehensive MRSA colonization surveillance program. It is not intended to diagnose MRSA infection nor to guide or monitor treatment for MRSA infections. Performed at [facility], [place] [address] [place], [place] [address]          Radiology Studies: Dg Chest 2 View  Result Date: 03/16/2019 CLINICAL DATA:  Chest pain EXAM: CHEST - 2 VIEW COMPARISON:  March 03, 2019 FINDINGS: There is no edema or consolidation. Heart size and pulmonary vascularity are normal. No adenopathy. No pneumothorax. No bone lesions. IMPRESSION: No edema or consolidation. Electronically Signed   By: Lowella Grip III M.D.   On: 03/16/2019 13:01        Scheduled Meds: . allopurinol  200 mg Oral Daily  . amLODipine  10 mg Oral Daily  . enoxaparin (LOVENOX) injection  40 mg Subcutaneous Q24H  . folic acid  1 mg Oral Daily  . metoprolol tartrate  12.5 mg Oral BID  . multivitamin with minerals  1 tablet Oral Daily  . nicotine  21 mg Transdermal Daily  . pantoprazole  40 mg Oral Daily  . sertraline  75 mg Oral Daily  . sodium chloride flush  3 mL Intravenous Once  . thiamine  100 mg Oral Daily   Continuous Infusions:   LOS: 2 days        Aline August, MD [facility] 03/18/2019, 9:56 AM

## 2019-03-18 NOTE — Progress Notes (Signed)
Pt c/o pain in arms and legs, per pt related to gout. He is getting ativan 2 mg every hour. BP 140/104. NP on call notified.

## 2019-03-19 LAB — BASIC METABOLIC PANEL
Anion gap: 11 (ref 5–15)
BUN: 13 mg/dL (ref 6–20)
CO2: 25 mmol/L (ref 22–32)
Calcium: 8.9 mg/dL (ref 8.9–10.3)
Chloride: 98 mmol/L (ref 98–111)
Creatinine, Ser: 0.78 mg/dL (ref 0.61–1.24)
GFR calc Af Amer: >60 mL/min (ref >60)
GFR calc non Af Amer: >60 mL/min (ref >60)
Glucose, Bld: 97 mg/dL (ref 70–99)
Potassium: 3.6 mmol/L (ref 3.5–5.1)
Sodium: 134 mmol/L — ABNORMAL LOW (ref 135–145)

## 2019-03-19 LAB — MAGNESIUM: Magnesium: 1.8 mg/dL (ref 1.7–2.4)

## 2019-03-19 MED ORDER — LORAZEPAM 1 MG PO TABS
1.0000 mg | ORAL_TABLET | ORAL | Status: DC | PRN
  Administered 2019-03-19: 1 mg via ORAL
  Administered 2019-03-19: 2 mg via ORAL
  Filled 2019-03-19: qty 2
  Filled 2019-03-19: qty 1

## 2019-03-19 MED ORDER — LORAZEPAM 2 MG/ML IJ SOLN: 1.0000 mg | INTRAMUSCULAR | Status: DC | PRN

## 2019-03-19 MED ORDER — CHLORDIAZEPOXIDE HCL 25 MG PO CAPS
25.0000 mg | ORAL_CAPSULE | Freq: Three times a day (TID) | ORAL | Status: DC
  Administered 2019-03-19 (×2): 25 mg via ORAL
  Filled 2019-03-19 (×2): qty 1

## 2019-03-19 NOTE — Evaluation (Signed)
Physical Therapy Evaluation Patient Details Name: [name] MRN: [id number] DOB: [birth year] Today's Date: 03/19/2019   History of Present Illness  48 yo male with chest pain and EtOH withdrawal was admitted for medical management.  Had low K+, thrombocytosis, cleared for cardiac event, but having gout on L foot.  PMHx:  EtOH abuse with seizure hx, HTN, PTSD, smoker, gout, rhabomyolysis, falls, delirium, depression, elevated LFT's  Clinical Impression  Pt was seen for mobility and balance as he has been falling in the recent past.  Has been medically managed but is having an acute flare of gout on his L Foot.  Follow up with him to work on endurance, control of walker with obstacles and to review stairs if time allows.  His O2 sats were stable walking with 94% at the lowest, but pulse at the highest with stairs was 130.  He had a sitting rest in the stairwell after the walk, and continued back with O2 sat 99%.  Follow acutely for work on strength, balance and endurance.    Follow Up Recommendations Home health PT;Supervision - Intermittent    Equipment Recommendations  None recommended by PT    Recommendations for Other Services       Precautions / Restrictions Precautions Precautions: Fall Precaution Comments: gout pain L foot Restrictions Weight Bearing Restrictions: No  Monitor O2 sats and pulses with mobility     Mobility  Bed Mobility Overal bed mobility: Modified Independent             General bed mobility comments: extra time to sit up on side of bed  Transfers Overall transfer level: Modified independent Equipment used: Rolling walker (2 wheeled) Transfers: Sit to/from Stand Sit to Stand: Min guard         General transfer comment: min guard due to gout pain   Ambulation/Gait Ambulation/Gait assistance: Min guard Gait Distance (Feet): 400 Feet(200 x 2) Assistive device: Rolling walker (2 wheeled) Gait Pattern/deviations: Step-through pattern;Decreased  stride length;Wide base of support;Drifts right/left Gait velocity: reduced Gait velocity interpretation: <1.31 ft/sec, indicative of household ambulator General Gait Details: tends not to notice the obstacles in the hallway and walks close, needed remidners  Stairs Stairs: Yes Stairs assistance: Supervision Stair Management: One rail Right;Forwards;Step to pattern;Alternating pattern Number of Stairs: 9 General stair comments: pt did alternating steps up and one step at a time going down  Wheelchair Mobility    Modified Rankin (Stroke Patients Only)       Balance Overall balance assessment: Needs assistance Sitting-balance support: Feet supported Sitting balance-Leahy Scale: Good     Standing balance support: Bilateral upper extremity supported;During functional activity Standing balance-Leahy Scale: Fair Standing balance comment: less than fair dynamically                             Pertinent Vitals/Pain Pain Assessment: No/denies pain    Home Living Family/patient expects to be discharged to:: Private residence Living Arrangements: Alone Available Help at Discharge: Family;Friend(s);Available PRN/intermittently Type of Home: Apartment Home Access: Stairs to enter Entrance Stairs-Rails: Left Entrance Stairs-Number of Steps: flight Home Layout: One level Home Equipment: Walker - 2 wheels      Prior Function Level of Independence: Independent with assistive device(s)         Comments: has family help to shop but rides a scooter     Hand Dominance   Dominant Hand: Right    Extremity/Trunk Assessment   Upper Extremity Assessment Upper  Extremity Assessment: Overall WFL for tasks assessed    Lower Extremity Assessment Lower Extremity Assessment: Generalized weakness    Cervical / Trunk Assessment Cervical / Trunk Assessment: Normal  Communication   Communication: No difficulties  Cognition Arousal/Alertness: Awake/alert Behavior During  Therapy: WFL for tasks assessed/performed Overall Cognitive Status: Within Functional Limits for tasks assessed                                 General Comments: Polite and cooperative      General Comments General comments (skin integrity, edema, etc.): pt is very quiet and seems unsure of the plan for therapy to follow up, reporting his house is not cleaned enough to have therapy there'    Exercises     Assessment/Plan    PT Assessment Patient needs continued PT services  PT Problem List Decreased strength;Decreased range of motion;Decreased activity tolerance;Decreased balance;Decreased mobility;Decreased coordination;Cardiopulmonary status limiting activity       PT Treatment Interventions DME instruction;Gait training;Stair training;Functional mobility training;Therapeutic activities;Therapeutic exercise;Balance training;Neuromuscular re-education;Patient/family education    PT Goals (Current goals can be found in the Care Plan section)  Acute Rehab PT Goals Patient Stated Goal: go home PT Goal Formulation: With patient Time For Goal Achievement: 04/02/19 Potential to Achieve Goals: Good    Frequency Min 3X/week   Barriers to discharge Inaccessible home environment;Decreased caregiver support home alone with stairs to enter    Co-evaluation               AM-PAC PT "6 Clicks" Mobility  Outcome Measure Help needed turning from your back to your side while in a flat bed without using bedrails?: None Help needed moving from lying on your back to sitting on the side of a flat bed without using bedrails?: None Help needed moving to and from a bed to a chair (including a wheelchair)?: A Little Help needed standing up from a chair using your arms (e.g., wheelchair or bedside chair)?: A Little Help needed to walk in hospital room?: A Little Help needed climbing 3-5 steps with a railing? : A Little 6 Click Score: 20    End of Session Equipment Utilized  During Treatment: Gait belt Activity Tolerance: Patient tolerated treatment well;Patient limited by fatigue Patient left: in bed;with call bell/phone within reach;with bed alarm set Nurse Communication: Mobility status PT Visit Diagnosis: Unsteadiness on feet (R26.81);Muscle weakness (generalized) (M62.81);Ataxic gait (R26.0)    Time: 3244-0102 PT Time Calculation (min) (ACUTE ONLY): 33 min   Charges:   PT Evaluation $PT Eval Moderate Complexity: 1 Mod PT Treatments $Gait Training: 8-22 mins       Ramond Dial 03/19/2019, 10:30 AM   Mee Hives, PT MS Acute Rehab Dept. Number: [place] [facility] [place]

## 2019-03-19 NOTE — Progress Notes (Signed)
Pt called up to the front and said that he was trying to reach for his urinal and fell out of bed. When I enter patient was sitting up in bed. He has a small bump on his cheek. No c/o of pain at this time. Dr. Genice Rouge notifed and not changes at this time. Please see Post fall report.

## 2019-03-19 NOTE — Progress Notes (Signed)
Patient ID: [name], male   DOB: [birth year], 48 y.o.   MRN: [id number]  PROGRESS NOTE    [name]  WPY:[id number] DOB: Jun 23, 1971 DOA: 03/16/2019 PCP: Scot Jun, FNP   Brief Narrative:  48 year old male with history of hypertension, chronic alcohol abuse, seizures, PTSD, GERD, tobacco abuse, gout, depression, anxiety, recent hospitalization from 03/02/2019-03/08/2019 for alcohol withdrawal which required ICU admission and Precedex drip presented on 03/16/2019 for chest pain and alcohol withdrawal.  Initial COVID-19 testing was negative.  Blood alcohol level was 83.  High-sensitivity troponin was negative x2.  Patient was started on IV fluids and treated with IV Ativan.  Assessment & Plan:   Alcohol withdrawal in a patient with chronic alcohol abuse  -Had recent hospitalization from 03/02/2019-03/08/2019 for alcohol withdrawal which required ICU stay and Precedex drip -Continue CIWA protocol with Ativan.  Continue thiamine, folate and multivitamins.  Patient still required frequent doses of Ativan yesterday.  Librium also added.  We will continue slow taper of Librium. -Still slightly tremulous.  -Social worker consult for providing outpatient resources   Atypical chest pain -High-sensitivity troponin x2-.  EKG not suggestive of ACS.  Currently no chest pain.  Hypokalemia -Improved.  Thrombocytosis -Probably reactive.  Improved.  Elevated LFTs -Probably from alcohol abuse.  Improved.  Monitor  Gout -Continue allopurinol  Hypertension -Continue metoprolol and amlodipine.  Blood pressure stable  Tobacco use -Continue nicotine patch.  Patient will need to stop smoking  GERD -Continue PPI  Depression, anxiety -Continue Zoloft  DVT prophylaxis: Lovenox Code Status: Full Family Communication: None at bedside Disposition Plan: Home in 1 to 2 days if clinically improves  Consultants: None  Procedures: None  Antimicrobials: None   Subjective: Patient seen  and examined at bedside.  He is a poor historian.  Still feels anxious and tremulous.  No overnight fever, nausea or vomiting.  Nursing staff reports that patient had to be given frequent doses of Ativan yesterday as well. Objective: Vitals:   03/19/19 0041 03/19/19 0300 03/19/19 0412 03/19/19 0531  BP:  (!) 127/93 133/88   Pulse:  92 88 82  Resp:  (!) 22 (!) 22   Temp: 98.5 F (36.9 C)  97.8 F (36.6 C)   TempSrc: Oral  Oral   SpO2:  96% 94%   Weight:      Height:        Intake/Output Summary (Last 24 hours) at 03/19/2019 0809 Last data filed at 03/19/2019 0539 Gross per 24 hour  Intake 480 ml  Output 700 ml  Net -220 ml   Filed Weights   03/18/19 2324  Weight: 97.3 kg    Examination:  General exam: No distress.  Very poor historian.  Still slightly tremulous  Respiratory system: Bilateral decreased breath sounds at bases with some scattered crackles Cardiovascular system: S1 & S2 heard, currently rate controlled Gastrointestinal system: Abdomen is nondistended, soft and nontender. Normal bowel sounds heard. Extremities: No cyanosis, edema     Data Reviewed: I have personally reviewed following labs and imaging studies  CBC: Recent Labs  Lab 03/16/19 1226 03/18/19 0500  WBC 5.3 8.6  NEUTROABS  --  6.8  HGB 15.0 13.1  HCT 44.9 39.1  MCV 100.7* 99.7  PLT 545* 767   Basic Metabolic Panel: Recent Labs  Lab 03/16/19 1226 03/18/19 0500 03/19/19 0251  NA 140 133* 134*  K 3.8 3.4* 3.6  CL 97* 97* 98  CO2 27 24 25   GLUCOSE 107* 95 97  BUN <5* 13  13  CREATININE 0.71 0.81 0.78  CALCIUM 9.1 8.7* 8.9  MG  --  1.8 1.8   GFR: Estimated Creatinine Clearance: 139.5 mL/min (by C-G formula based on SCr of 0.78 mg/dL). Liver Function Tests: Recent Labs  Lab 03/17/19 0244 03/18/19 0500  AST 53* 33  ALT 56* 42  ALKPHOS 121 114  BILITOT 0.8 0.7  PROT 6.1* 5.9*  ALBUMIN 3.1* 3.0*   No results for input(s): LIPASE, AMYLASE in the last 168 hours. No results for  input(s): AMMONIA in the last 168 hours. Coagulation Profile: No results for input(s): INR, PROTIME in the last 168 hours. Cardiac Enzymes: Recent Labs  Lab 03/16/19 1621  CKTOTAL 122   BNP (last 3 results) No results for input(s): PROBNP in the last 8760 hours. HbA1C: No results for input(s): HGBA1C in the last 72 hours. CBG: No results for input(s): GLUCAP in the last 168 hours. Lipid Profile: No results for input(s): CHOL, HDL, LDLCALC, TRIG, CHOLHDL, LDLDIRECT in the last 72 hours. Thyroid Function Tests: No results for input(s): TSH, T4TOTAL, FREET4, T3FREE, THYROIDAB in the last 72 hours. Anemia Panel: No results for input(s): VITAMINB12, FOLATE, FERRITIN, TIBC, IRON, RETICCTPCT in the last 72 hours. Sepsis Labs: No results for input(s): PROCALCITON, LATICACIDVEN in the last 168 hours.  Recent Results (from the past 240 hour(s))  SARS Coronavirus 2 (CEPHEID - Performed in [place] hospital lab), Hosp Order     Status: None   Collection Time: 03/16/19  3:41 PM   Specimen: Nasopharyngeal Swab  Result Value Ref Range Status   SARS Coronavirus 2 NEGATIVE NEGATIVE Final    Comment: (NOTE) If result is NEGATIVE SARS-CoV-2 target nucleic acids are NOT DETECTED. The SARS-CoV-2 RNA is generally detectable in upper and lower  respiratory specimens during the acute phase of infection. The lowest  concentration of SARS-CoV-2 viral copies this assay can detect is 250  copies / mL. A negative result does not preclude SARS-CoV-2 infection  and should not be used as the sole basis for treatment or other  patient management decisions.  A negative result may occur with  improper specimen collection / handling, submission of specimen other  than nasopharyngeal swab, presence of viral mutation(s) within the  areas targeted by this assay, and inadequate number of viral copies  (<250 copies / mL). A negative result must be combined with clinical  observations, patient history, and  epidemiological information. If result is POSITIVE SARS-CoV-2 target nucleic acids are DETECTED. The SARS-CoV-2 RNA is generally detectable in upper and lower  respiratory specimens dur ing the acute phase of infection.  Positive  results are indicative of active infection with SARS-CoV-2.  Clinical  correlation with patient history and other diagnostic information is  necessary to determine patient infection status.  Positive results do  not rule out bacterial infection or co-infection with other viruses. If result is PRESUMPTIVE POSTIVE SARS-CoV-2 nucleic acids MAY BE PRESENT.   A presumptive positive result was obtained on the submitted specimen  and confirmed on repeat testing.  While 2019 novel coronavirus  (SARS-CoV-2) nucleic acids may be present in the submitted sample  additional confirmatory testing may be necessary for epidemiological  and / or clinical management purposes  to differentiate between  SARS-CoV-2 and other Sarbecovirus currently known to infect humans.  If clinically indicated additional testing with an alternate test  methodology [phone]) is advised. The SARS-CoV-2 RNA is generally  detectable in upper and lower respiratory sp ecimens during the acute  phase of infection.  The expected result is Negative. Fact Sheet for Patients:  StrictlyIdeas.no Fact Sheet for Healthcare Providers: BankingDealers.co.za This test is not yet approved or cleared by the [place] FDA and has been authorized for detection and/or diagnosis of SARS-CoV-2 by FDA under an Emergency Use Authorization (EUA).  This EUA will remain in effect (meaning this test can be used) for the duration of the COVID-19 declaration under Section 564(b)(1) of the Act, 21 U.S.C. section 360bbb-3(b)(1), unless the authorization is terminated or revoked sooner. Performed at [facility], [place] [address], [place], [place] [address]   MRSA PCR  Screening     Status: None   Collection Time: 03/16/19 11:00 PM   Specimen: Nasal Mucosa; Nasopharyngeal  Result Value Ref Range Status   MRSA by PCR NEGATIVE NEGATIVE Final    Comment:        The GeneXpert MRSA Assay (FDA approved for NASAL specimens only), is one component of a comprehensive MRSA colonization surveillance program. It is not intended to diagnose MRSA infection nor to guide or monitor treatment for MRSA infections. Performed at [facility], [place] [address] [place], [place] [address]          Radiology Studies: No results found.      Scheduled Meds: . allopurinol  200 mg Oral Daily  . amLODipine  10 mg Oral Daily  . chlordiazePOXIDE  25 mg Oral QID  . enoxaparin (LOVENOX) injection  40 mg Subcutaneous Q24H  . folic acid  1 mg Oral Daily  . metoprolol tartrate  12.5 mg Oral BID  . multivitamin with minerals  1 tablet Oral Daily  . nicotine  21 mg Transdermal Daily  . pantoprazole  40 mg Oral Daily  . sertraline  75 mg Oral Daily  . sodium chloride flush  3 mL Intravenous Once  . thiamine  100 mg Oral Daily   Continuous Infusions:   LOS: 3 days        Aline August, MD [facility] 03/19/2019, 8:09 AM

## 2019-03-20 MED ORDER — CHLORDIAZEPOXIDE HCL 25 MG PO CAPS
25.0000 mg | ORAL_CAPSULE | Freq: Two times a day (BID) | ORAL | Status: DC
  Administered 2019-03-20: 25 mg via ORAL
  Filled 2019-03-20: qty 1

## 2019-03-20 MED ORDER — CHLORDIAZEPOXIDE HCL 25 MG PO CAPS: ORAL_CAPSULE | ORAL | 0 refills | Status: DC

## 2019-03-20 NOTE — TOC Transition Note (Signed)
Transition of Care [facility]) - CM/SW Discharge Note   Patient Details  Name: [name] MRN: [id number] Date of Birth: 1970-10-04  [facility] CM/SW Contact:  Carles Collet, RN Phone Number: 03/20/2019, 10:25 AM   Clinical Narrative:    Damaris Schooner w patient. Discussed [place] providers and Medicare ratings. [facility] chosen and referral accepted. No DME needs. No other CM needs.     Final next level of care: [place] Barriers to Discharge: No Barriers Identified   Patient Goals and CMS Choice Patient states their goals for this hospitalization and ongoing recovery are:: to return home CMS Medicare.gov Compare Post Acute Care list provided to:: Patient Choice offered to / list presented to : Patient  Discharge Placement                       Discharge Plan and Services                DME Arranged: N/A DME Agency: NA       HH Arranged: PT HH Agency: [place] Date [place]: 03/20/19 Time [place]: 48 Representative spoke with at [place]: [place] ([place]) Interventions     Readmission Risk Interventions Readmission Risk Prevention Plan 01/15/2019  Medication Screening Complete  Transportation Screening Complete  Some recent data might be hidden

## 2019-03-20 NOTE — Discharge Summary (Signed)
Physician Discharge Summary  [name] JSE:[id number] DOB: 1970/11/20 DOA: 03/16/2019  PCP: Scot Jun, FNP  Admit date: 03/16/2019 Discharge date: 03/20/2019  Admitted From: Home Disposition: Home  Recommendations for Outpatient Follow-up:  1. Follow up with PCP in 1 week with repeat CBC/CMP 2. Abstain from alcohol 3. Follow up in ED if symptoms worsen or new appear   Home Health: Home health PT Equipment/Devices: None  Discharge Condition: Stable CODE STATUS: Full Diet recommendation: Heart healthy  Brief/Interim Summary: 48 year old male with history of hypertension, chronic alcohol abuse, seizures, PTSD, GERD, tobacco abuse, gout, depression, anxiety, recent hospitalization from 03/02/2019-03/08/2019 for alcohol withdrawal which required ICU admission and Precedex drip presented on 03/16/2019 for chest pain and alcohol withdrawal.  Initial COVID-19 testing was negative.  Blood alcohol level was 83.  High-sensitivity troponin was negative x2.  Patient was started on IV fluids and treated with IV Ativan.  His condition has gradually improved.  Will be discharged home with home health PT on tapering doses of Librium.  Discharge Diagnoses:   Alcohol withdrawal in a patient with chronic alcohol abuse  -Had recent hospitalization from 03/02/2019-03/08/2019 for alcohol withdrawal which required ICU stay and Precedex drip -Treated with CIWA protocol with Ativan.   -Also started on Librium which is being tapered.  Patient feels much better and is less tremulous.  He feels that he is ready for discharge. -He will be discharged on Librium 25 mg twice a day for 2 days till 03/21/2019 and 25 mg daily from 03/22/2019 for 2 days till 03/23/2019.  Then stop Librium. -Continue thiamine, folate and multivitamins.  -Counseled about abstinence from alcohol. -Social worker consult for providing outpatient resources   Atypical chest pain -High-sensitivity troponin x2-.  EKG not suggestive of ACS.   Currently no chest pain.  Hypokalemia -Improved.  Thrombocytosis -Probably reactive.  Improved.  Elevated LFTs -Probably from alcohol abuse.  Improved.  Monitor  Gout -Continue allopurinol  Hypertension -Continue metoprolol and amlodipine.  Blood pressure stable  Tobacco use -Continue nicotine patch.  Patient will need to stop smoking  GERD -Continue PPI  Depression, anxiety -Continue Zoloft   Discharge Instructions  Discharge Instructions    Diet - low sodium heart healthy   Complete by: As directed    Increase activity slowly   Complete by: As directed      Allergies as of 03/20/2019      Reactions   Wellbutrin [bupropion] Other (See Comments)   Caused seizures      Medication List    STOP taking these medications   cyclobenzaprine 5 MG tablet Commonly known as: FLEXERIL     TAKE these medications   allopurinol 100 MG tablet Commonly known as: ZYLOPRIM Take 2 tablets (200 mg total) by mouth daily.   amLODipine 10 MG tablet Commonly known as: NORVASC Take 1 tablet (10 mg total) by mouth daily.   aspirin 81 MG chewable tablet Chew 324 mg by mouth as needed (for sudden onset of chest pain).   chlordiazePOXIDE 25 MG capsule Commonly known as: LIBRIUM 25mg  BID from till 03/21/19, then 25mg  daily from 03/22/19-03/23/19 for 2 days, then stop   folic acid 1 MG tablet Commonly known as: FOLVITE Take 1 tablet (1 mg total) by mouth daily.   metoprolol tartrate 25 MG tablet Commonly known as: LOPRESSOR Take 0.5 tablets (12.5 mg total) by mouth 2 (two) times daily.   multivitamin with minerals Tabs tablet Take 1 tablet by mouth daily.   nicotine 21 mg/24hr patch  Commonly known as: NICODERM CQ - dosed in mg/24 hours Place 1 patch (21 mg total) onto the skin daily.   pantoprazole 40 MG tablet Commonly known as: PROTONIX Take 1 tablet (40 mg total) by mouth daily.   sertraline 50 MG tablet Commonly known as: ZOLOFT Take 75 mg by mouth daily.    thiamine 100 MG tablet Take 1 tablet (100 mg total) by mouth daily.      Follow-up Information    Scot Jun, FNP. Schedule an appointment as soon as possible for a visit in 1 week(s).   Specialty: Family Medicine Why: With repeat CBC/CMP Contact information: [address] [place] [address] [phone]          Allergies  Allergen Reactions  . Wellbutrin [Bupropion] Other (See Comments)    Caused seizures    Consultations:  None   Procedures/Studies: Dg Chest 2 View  Result Date: 03/16/2019 CLINICAL DATA:  Chest pain EXAM: CHEST - 2 VIEW COMPARISON:  March 03, 2019 FINDINGS: There is no edema or consolidation. Heart size and pulmonary vascularity are normal. No adenopathy. No pneumothorax. No bone lesions. IMPRESSION: No edema or consolidation. Electronically Signed   By: Lowella Grip III M.D.   On: 03/16/2019 13:01   Ct Head Wo Contrast  Result Date: 03/02/2019 CLINICAL DATA:  Altered level of consciousness EXAM: CT HEAD WITHOUT CONTRAST CT CERVICAL SPINE WITHOUT CONTRAST TECHNIQUE: Multidetector CT imaging of the head and cervical spine was performed following the standard protocol without intravenous contrast. Multiplanar CT image reconstructions of the cervical spine were also generated. COMPARISON:  01/10/2019 FINDINGS: CT HEAD FINDINGS Brain: No evidence of acute infarction, hemorrhage, hydrocephalus, extra-axial collection or mass lesion/mass effect. Age advanced brain atrophy that is generalized. Vascular: No hyperdense vessel or unexpected calcification. Skull: Patchy areas of scalp scarring. Negative for calvarial fracture Sinuses/Orbits: Negative CT CERVICAL SPINE FINDINGS Alignment: Normal. Skull base and vertebrae: Negative for fracture or bone lesion Soft tissues and spinal canal: No prevertebral fluid or swelling. No visible canal hematoma. Disc levels:  Mid cervical spondylosis. Upper chest: Negative IMPRESSION: 1. No acute  intracranial or cervical spine finding. 2. Age advanced brain atrophy. Electronically Signed   By: Monte Fantasia M.D.   On: 03/02/2019 04:44   Ct Cervical Spine Wo Contrast  Result Date: 03/02/2019 CLINICAL DATA:  Altered level of consciousness EXAM: CT HEAD WITHOUT CONTRAST CT CERVICAL SPINE WITHOUT CONTRAST TECHNIQUE: Multidetector CT imaging of the head and cervical spine was performed following the standard protocol without intravenous contrast. Multiplanar CT image reconstructions of the cervical spine were also generated. COMPARISON:  01/10/2019 FINDINGS: CT HEAD FINDINGS Brain: No evidence of acute infarction, hemorrhage, hydrocephalus, extra-axial collection or mass lesion/mass effect. Age advanced brain atrophy that is generalized. Vascular: No hyperdense vessel or unexpected calcification. Skull: Patchy areas of scalp scarring. Negative for calvarial fracture Sinuses/Orbits: Negative CT CERVICAL SPINE FINDINGS Alignment: Normal. Skull base and vertebrae: Negative for fracture or bone lesion Soft tissues and spinal canal: No prevertebral fluid or swelling. No visible canal hematoma. Disc levels:  Mid cervical spondylosis. Upper chest: Negative IMPRESSION: 1. No acute intracranial or cervical spine finding. 2. Age advanced brain atrophy. Electronically Signed   By: Monte Fantasia M.D.   On: 03/02/2019 04:44   Dg Chest Port 1 View  Result Date: 03/03/2019 CLINICAL DATA:  Onset altered mental status yesterday. EXAM: PORTABLE CHEST 1 VIEW COMPARISON:  PA and lateral chest 06/01/2018 and 09/30/2015 FINDINGS: The lungs are clear. Heart size  is normal. No pneumothorax or pleural fluid. Remote right rib fractures noted. IMPRESSION: No acute disease. Electronically Signed   By: Inge Rise M.D.   On: 03/03/2019 08:16   Dg Chest Portable 1 View  Result Date: 03/02/2019 CLINICAL DATA:  48 year old male with altered mental status. EXAM: PORTABLE CHEST 1 VIEW COMPARISON:  Chest CTA 06/01/2018 and  earlier. FINDINGS: Portable AP semi upright view at 0222 hours. Lung volumes and mediastinal contours are stable and within normal limits. Visualized tracheal air column is within normal limits. Allowing for portable technique the lungs are clear. Chronic right lateral 7th rib fracture. No acute osseous abnormality identified. IMPRESSION: No acute cardiopulmonary abnormality. Electronically Signed   By: Genevie Ann M.D.   On: 03/02/2019 02:42       Subjective: Seen and examined with staff.  Patient feels better.  Feels less tremulous.  Feels okay to go home today.  No overnight fever, nausea or vomiting.  Discharge Exam: Vitals:   03/19/19 2027 03/20/19 0609  BP: 109/83 121/87  Pulse:  87  Resp:  16  Temp:  98.2 F (36.8 C)  SpO2:  93%    General: Pt is alert, awake, not in acute distress.  Extremities. Cardiovascular: rate controlled, S1/S2 + Respiratory: bilateral decreased breath sounds at bases Abdominal: Soft, NT, ND, bowel sounds + Extremities: no edema, no cyanosis    The results of significant diagnostics from this hospitalization (including imaging, microbiology, ancillary and laboratory) are listed below for reference.     Microbiology: Recent Results (from the past 240 hour(s))  SARS Coronavirus 2 (CEPHEID - Performed in [place] hospital lab), Hosp Order     Status: None   Collection Time: 03/16/19  3:41 PM   Specimen: Nasopharyngeal Swab  Result Value Ref Range Status   SARS Coronavirus 2 NEGATIVE NEGATIVE Final    Comment: (NOTE) If result is NEGATIVE SARS-CoV-2 target nucleic acids are NOT DETECTED. The SARS-CoV-2 RNA is generally detectable in upper and lower  respiratory specimens during the acute phase of infection. The lowest  concentration of SARS-CoV-2 viral copies this assay can detect is 250  copies / mL. A negative result does not preclude SARS-CoV-2 infection  and should not be used as the sole basis for treatment or other  patient management  decisions.  A negative result may occur with  improper specimen collection / handling, submission of specimen other  than nasopharyngeal swab, presence of viral mutation(s) within the  areas targeted by this assay, and inadequate number of viral copies  (<250 copies / mL). A negative result must be combined with clinical  observations, patient history, and epidemiological information. If result is POSITIVE SARS-CoV-2 target nucleic acids are DETECTED. The SARS-CoV-2 RNA is generally detectable in upper and lower  respiratory specimens dur ing the acute phase of infection.  Positive  results are indicative of active infection with SARS-CoV-2.  Clinical  correlation with patient history and other diagnostic information is  necessary to determine patient infection status.  Positive results do  not rule out bacterial infection or co-infection with other viruses. If result is PRESUMPTIVE POSTIVE SARS-CoV-2 nucleic acids MAY BE PRESENT.   A presumptive positive result was obtained on the submitted specimen  and confirmed on repeat testing.  While 2019 novel coronavirus  (SARS-CoV-2) nucleic acids may be present in the submitted sample  additional confirmatory testing may be necessary for epidemiological  and / or clinical management purposes  to differentiate between  SARS-CoV-2 and other Sarbecovirus currently  known to infect humans.  If clinically indicated additional testing with an alternate test  methodology [phone]) is advised. The SARS-CoV-2 RNA is generally  detectable in upper and lower respiratory sp ecimens during the acute  phase of infection. The expected result is Negative. Fact Sheet for Patients:  StrictlyIdeas.no Fact Sheet for Healthcare Providers: BankingDealers.co.za This test is not yet approved or cleared by the [place] FDA and has been authorized for detection and/or diagnosis of SARS-CoV-2 by FDA under an  Emergency Use Authorization (EUA).  This EUA will remain in effect (meaning this test can be used) for the duration of the COVID-19 declaration under Section 564(b)(1) of the Act, 21 U.S.C. section 360bbb-3(b)(1), unless the authorization is terminated or revoked sooner. Performed at [facility], [place] [address] [place], [place] [address]   MRSA PCR Screening     Status: None   Collection Time: 03/16/19 11:00 PM   Specimen: Nasal Mucosa; Nasopharyngeal  Result Value Ref Range Status   MRSA by PCR NEGATIVE NEGATIVE Final    Comment:        The GeneXpert MRSA Assay (FDA approved for NASAL specimens only), is one component of a comprehensive MRSA colonization surveillance program. It is not intended to diagnose MRSA infection nor to guide or monitor treatment for MRSA infections. Performed at [facility], [place] [address] [place], [place] [address]      Labs: BNP (last 3 results) No results for input(s): BNP in the last 8760 hours. Basic Metabolic Panel: Recent Labs  Lab 03/16/19 1226 03/18/19 0500 03/19/19 0251  NA 140 133* 134*  K 3.8 3.4* 3.6  CL 97* 97* 98  CO2 27 24 25   GLUCOSE 107* 95 97  BUN <5* 13 13  CREATININE 0.71 0.81 0.78  CALCIUM 9.1 8.7* 8.9  MG  --  1.8 1.8   Liver Function Tests: Recent Labs  Lab 03/17/19 0244 03/18/19 0500  AST 53* 33  ALT 56* 42  ALKPHOS 121 114  BILITOT 0.8 0.7  PROT 6.1* 5.9*  ALBUMIN 3.1* 3.0*   No results for input(s): LIPASE, AMYLASE in the last 168 hours. No results for input(s): AMMONIA in the last 168 hours. CBC: Recent Labs  Lab 03/16/19 1226 03/18/19 0500  WBC 5.3 8.6  NEUTROABS  --  6.8  HGB 15.0 13.1  HCT 44.9 39.1  MCV 100.7* 99.7  PLT 545* 354   Cardiac Enzymes: Recent Labs  Lab 03/16/19 1621  CKTOTAL 122   BNP: Invalid input(s): POCBNP CBG: No results for input(s): GLUCAP in the last 168 hours. D-Dimer No results for input(s): DDIMER in the last 72 hours. Hgb A1c No  results for input(s): HGBA1C in the last 72 hours. Lipid Profile No results for input(s): CHOL, HDL, LDLCALC, TRIG, CHOLHDL, LDLDIRECT in the last 72 hours. Thyroid function studies No results for input(s): TSH, T4TOTAL, T3FREE, THYROIDAB in the last 72 hours.  Invalid input(s): FREET3 Anemia work up No results for input(s): VITAMINB12, FOLATE, FERRITIN, TIBC, IRON, RETICCTPCT in the last 72 hours. Urinalysis    Component Value Date/Time   COLORURINE YELLOW 03/03/2019 0843   APPEARANCEUR CLEAR 03/03/2019 0843   LABSPEC 1.012 03/03/2019 0843   PHURINE 7.0 03/03/2019 [place] 03/03/2019 0843   HGBUR LARGE (A) 03/03/2019 0843   BILIRUBINUR NEGATIVE 03/03/2019 0843   KETONESUR 5 (A) 03/03/2019 0843   PROTEINUR 30 (A) 03/03/2019 0843   UROBILINOGEN 1.0 12/24/2014 0643   NITRITE NEGATIVE 03/03/2019 8250  LEUKOCYTESUR LARGE (A) 03/03/2019 0843   Sepsis Labs Invalid input(s): PROCALCITONIN,  WBC,  LACTICIDVEN Microbiology Recent Results (from the past 240 hour(s))  SARS Coronavirus 2 (CEPHEID - Performed in [place] hospital lab), Hosp Order     Status: None   Collection Time: 03/16/19  3:41 PM   Specimen: Nasopharyngeal Swab  Result Value Ref Range Status   SARS Coronavirus 2 NEGATIVE NEGATIVE Final    Comment: (NOTE) If result is NEGATIVE SARS-CoV-2 target nucleic acids are NOT DETECTED. The SARS-CoV-2 RNA is generally detectable in upper and lower  respiratory specimens during the acute phase of infection. The lowest  concentration of SARS-CoV-2 viral copies this assay can detect is 250  copies / mL. A negative result does not preclude SARS-CoV-2 infection  and should not be used as the sole basis for treatment or other  patient management decisions.  A negative result may occur with  improper specimen collection / handling, submission of specimen other  than nasopharyngeal swab, presence of viral mutation(s) within the  areas targeted by this assay, and  inadequate number of viral copies  (<250 copies / mL). A negative result must be combined with clinical  observations, patient history, and epidemiological information. If result is POSITIVE SARS-CoV-2 target nucleic acids are DETECTED. The SARS-CoV-2 RNA is generally detectable in upper and lower  respiratory specimens dur ing the acute phase of infection.  Positive  results are indicative of active infection with SARS-CoV-2.  Clinical  correlation with patient history and other diagnostic information is  necessary to determine patient infection status.  Positive results do  not rule out bacterial infection or co-infection with other viruses. If result is PRESUMPTIVE POSTIVE SARS-CoV-2 nucleic acids MAY BE PRESENT.   A presumptive positive result was obtained on the submitted specimen  and confirmed on repeat testing.  While 2019 novel coronavirus  (SARS-CoV-2) nucleic acids may be present in the submitted sample  additional confirmatory testing may be necessary for epidemiological  and / or clinical management purposes  to differentiate between  SARS-CoV-2 and other Sarbecovirus currently known to infect humans.  If clinically indicated additional testing with an alternate test  methodology [phone]) is advised. The SARS-CoV-2 RNA is generally  detectable in upper and lower respiratory sp ecimens during the acute  phase of infection. The expected result is Negative. Fact Sheet for Patients:  StrictlyIdeas.no Fact Sheet for Healthcare Providers: BankingDealers.co.za This test is not yet approved or cleared by the [place] FDA and has been authorized for detection and/or diagnosis of SARS-CoV-2 by FDA under an Emergency Use Authorization (EUA).  This EUA will remain in effect (meaning this test can be used) for the duration of the COVID-19 declaration under Section 564(b)(1) of the Act, 21 U.S.C. section 360bbb-3(b)(1), unless the  authorization is terminated or revoked sooner. Performed at [facility], [place] [address] [place], [place] [address]   MRSA PCR Screening     Status: None   Collection Time: 03/16/19 11:00 PM   Specimen: Nasal Mucosa; Nasopharyngeal  Result Value Ref Range Status   MRSA by PCR NEGATIVE NEGATIVE Final    Comment:        The GeneXpert MRSA Assay (FDA approved for NASAL specimens only), is one component of a comprehensive MRSA colonization surveillance program. It is not intended to diagnose MRSA infection nor to guide or monitor treatment for MRSA infections. Performed at [facility], [place] [address] [place], [place] [address]  Time coordinating discharge: 35 minutes  SIGNED:   Aline August, MD  [facility] 03/20/2019, 9:06 AM

## 2019-03-20 NOTE — Progress Notes (Signed)
[name] to be D/C'd home per MD order. Discussed with the patient and all questions fully answered. VVS, Skin clean, dry and intact without evidence of skin break down. IV catheter discontinued intact. Site without signs and symptoms of complications. Dressing and pressure applied.  An After Visit Summary was printed and given to the patient.  Patient escorted via [place], and D/C home via private auto.  Melonie [place]  03/20/2019 11:50 AM

## 2019-03-25 ENCOUNTER — Telehealth: Payer: Self-pay | Admitting: Family Medicine

## 2019-03-25 NOTE — Telephone Encounter (Signed)
Cecilia from [facility] called to place orders for physical therapy, medical social worker for community resources, 1 week one 2 week four..please follow up

## 2019-03-28 NOTE — Telephone Encounter (Signed)
[name] was called and given verbal orders.

## 2019-04-04 ENCOUNTER — Inpatient Hospital Stay: Payer: Medicare Other | Admitting: Family Medicine

## 2019-04-07 ENCOUNTER — Inpatient Hospital Stay: Payer: Medicare Other | Admitting: Family Medicine

## 2019-06-08 ENCOUNTER — Telehealth: Payer: Self-pay

## 2019-06-08 NOTE — Telephone Encounter (Signed)
Called patient to do their pre-visit COVID screening.  Call went to voicemail. Unable to do prescreening.  

## 2019-06-09 ENCOUNTER — Ambulatory Visit (INDEPENDENT_AMBULATORY_CARE_PROVIDER_SITE_OTHER): Payer: Medicare Other | Admitting: Family Medicine

## 2019-06-09 DIAGNOSIS — M1A9XX0 Chronic gout, unspecified, without tophus (tophi): Secondary | ICD-10-CM

## 2019-06-09 DIAGNOSIS — N528 Other male erectile dysfunction: Secondary | ICD-10-CM

## 2019-06-09 DIAGNOSIS — F251 Schizoaffective disorder, depressive type: Secondary | ICD-10-CM | POA: Diagnosis not present

## 2019-06-09 DIAGNOSIS — I10 Essential (primary) hypertension: Secondary | ICD-10-CM

## 2019-06-09 DIAGNOSIS — K219 Gastro-esophageal reflux disease without esophagitis: Secondary | ICD-10-CM | POA: Diagnosis not present

## 2019-06-09 MED ORDER — MISC. DEVICES MISC: 0 refills | Status: DC

## 2019-06-09 MED ORDER — AMLODIPINE BESYLATE 10 MG PO TABS: 10.0000 mg | ORAL_TABLET | Freq: Every day | ORAL | 1 refills | Status: DC

## 2019-06-09 MED ORDER — METOPROLOL TARTRATE 25 MG PO TABS: 12.5000 mg | ORAL_TABLET | Freq: Two times a day (BID) | ORAL | 1 refills | Status: DC

## 2019-06-09 MED ORDER — ALLOPURINOL 100 MG PO TABS: 200.0000 mg | ORAL_TABLET | Freq: Every day | ORAL | 1 refills | Status: DC

## 2019-06-09 MED ORDER — SILDENAFIL CITRATE 50 MG PO TABS: 50.0000 mg | ORAL_TABLET | Freq: Every day | ORAL | 1 refills | Status: DC | PRN

## 2019-06-09 MED ORDER — PANTOPRAZOLE SODIUM 40 MG PO TBEC: 40.0000 mg | DELAYED_RELEASE_TABLET | Freq: Every day | ORAL | 1 refills | Status: DC

## 2019-06-09 NOTE — Progress Notes (Signed)
Virtual Visit via Telephone Note  I connected with [name], on 06/09/2019 at 9:19 AM by telephone due to the COVID-19 pandemic and verified that I am speaking with the correct person using two identifiers.   Consent: I discussed the limitations, risks, security and privacy concerns of performing an evaluation and management service by telephone and the availability of in person appointments. I also discussed with the patient that there may be a patient responsible charge related to this service. The patient expressed understanding and agreed to proceed.   Location of Patient: Home  Location of Provider: Clinic   Persons participating in Telemedicine visit: [place] [facility] Dr. Felecia Shelling     History of Present Illness: 48 year old male with a history of Alcohol abuse, Bipolar Depression, GERD, Hypertension, Gout here for a follow up visit. Bipolar Depression is managed with Prozac by Psych and this is stable.  He complains of erectile dysfunction over the last few months and denies any recent changes in his medications or medical conditions. His GERD is controlled with no flares and Gout has been stable. He is compliant with his antihypertensive but is not able to check his BP at home as he has no BP machine. Denies adverse effects from his medications. He is due for a Flu shot and has been advised to receive one either from his Pharmacy or the clinic.  Past Medical History:  Diagnosis Date  . Alcohol abuse   . Anxiety    at age 51  . Depression    at age 87  . Psychosis ([place])   . PTSD (post-traumatic stress disorder)   . Schizoaffective disorder   . Seizure disorder ([place])    related to etoh seizure   Allergies  Allergen Reactions  . Wellbutrin [Bupropion] Other (See Comments)    Caused seizures    Current Outpatient Medications on File Prior to Visit  Medication Sig Dispense Refill  . allopurinol (ZYLOPRIM) 100 MG tablet Take 2 tablets  (200 mg total) by mouth daily. 60 tablet 2  . amLODipine (NORVASC) 10 MG tablet Take 1 tablet (10 mg total) by mouth daily. 30 tablet 3  . metoprolol tartrate (LOPRESSOR) 25 MG tablet Take 0.5 tablets (12.5 mg total) by mouth 2 (two) times daily. 60 tablet 2  . pantoprazole (PROTONIX) 40 MG tablet Take 1 tablet (40 mg total) by mouth daily. 90 tablet 1  . sertraline (ZOLOFT) 50 MG tablet Take 75 mg by mouth daily.      No current facility-administered medications on file prior to visit.     Observations/Objective: Awake, alert, oriented x3 Not in acute distress   Assessment and Plan: 1. Other male erectile dysfunction Discussed initiation of Sildenafil and adverse effects - sildenafil (VIAGRA) 50 MG tablet; Take 1 tablet (50 mg total) by mouth daily as needed for erectile dysfunction. At least 24 hours between doses  Dispense: 10 tablet; Refill: 1  2. Gastroesophageal reflux disease without esophagitis Controlled - pantoprazole (PROTONIX) 40 MG tablet; Take 1 tablet (40 mg total) by mouth daily.  Dispense: 90 tablet; Refill: 1  3. Chronic gout without tophus, unspecified cause, unspecified site Stable with no acute flares - allopurinol (ZYLOPRIM) 100 MG tablet; Take 2 tablets (200 mg total) by mouth daily.  Dispense: 180 tablet; Refill: 1  4. Essential hypertension Unable to assess control Provided prescription for BP monitor Continue current regimen Counseled on blood pressure goal of less than 130/80, low-sodium, DASH diet, medication compliance, 150 minutes of moderate  intensity exercise per week. Discussed medication compliance, adverse effects. - amLODipine (NORVASC) 10 MG tablet; Take 1 tablet (10 mg total) by mouth daily.  Dispense: 90 tablet; Refill: 1 - metoprolol tartrate (LOPRESSOR) 25 MG tablet; Take 0.5 tablets (12.5 mg total) by mouth 2 (two) times daily.  Dispense: 90 tablet; Refill: 1 - Misc. Devices MISC; Blood Pressure monitor. Dx :Hypertension  Dispense: 1 each;  Refill: 0  5. Schizoaffective disorder, depressive type ([place]) Stable Management as per Psych  Follow Up Instructions: Return in about 6 months (around 12/08/2019) for chronic medical conditions - in person.    I discussed the assessment and treatment plan with the patient. The patient was provided an opportunity to ask questions and all were answered. The patient agreed with the plan and demonstrated an understanding of the instructions.   The patient was advised to call back or seek an in-person evaluation if the symptoms worsen or if the condition fails to improve as anticipated.     I provided 15 minutes total of non-face-to-face time during this encounter including median intraservice time, reviewing previous notes, labs, imaging, medications, management and patient verbalized understanding.     [name], MD, FAAFP. [facility] and [place], [place]   06/09/2019, 9:19 AM

## 2019-06-09 NOTE — Progress Notes (Signed)
Doesn't check BP at home. Denies chest pain, SHOB, palpitations, headaches, dizziness, lower extremity swelling.  GERD has been well controlled.  Also worried about erectile dysfunction.

## 2019-10-05 ENCOUNTER — Encounter: Payer: Self-pay | Admitting: Internal Medicine

## 2019-10-05 ENCOUNTER — Ambulatory Visit (INDEPENDENT_AMBULATORY_CARE_PROVIDER_SITE_OTHER): Payer: Medicare Other | Admitting: Internal Medicine

## 2019-10-05 ENCOUNTER — Other Ambulatory Visit: Payer: Self-pay

## 2019-10-05 ENCOUNTER — Inpatient Hospital Stay (HOSPITAL_COMMUNITY)
Admission: EM | Admit: 2019-10-05 | Discharge: 2019-10-07 | DRG: 897 | Disposition: A | Discharge status: Home / Self Care | Payer: Medicare Other | Attending: Internal Medicine | Admitting: Internal Medicine

## 2019-10-05 ENCOUNTER — Emergency Department (HOSPITAL_COMMUNITY): Payer: Medicare Other

## 2019-10-05 ENCOUNTER — Encounter (HOSPITAL_COMMUNITY): Payer: Self-pay

## 2019-10-05 DIAGNOSIS — F17210 Nicotine dependence, cigarettes, uncomplicated: Secondary | ICD-10-CM | POA: Diagnosis present

## 2019-10-05 DIAGNOSIS — D539 Nutritional anemia, unspecified: Secondary | ICD-10-CM | POA: Diagnosis not present

## 2019-10-05 DIAGNOSIS — R7401 Elevation of levels of liver transaminase levels: Secondary | ICD-10-CM

## 2019-10-05 DIAGNOSIS — I509 Heart failure, unspecified: Secondary | ICD-10-CM

## 2019-10-05 DIAGNOSIS — Z818 Family history of other mental and behavioral disorders: Secondary | ICD-10-CM

## 2019-10-05 DIAGNOSIS — Z8249 Family history of ischemic heart disease and other diseases of the circulatory system: Secondary | ICD-10-CM | POA: Diagnosis not present

## 2019-10-05 DIAGNOSIS — R600 Localized edema: Secondary | ICD-10-CM | POA: Diagnosis present

## 2019-10-05 DIAGNOSIS — K219 Gastro-esophageal reflux disease without esophagitis: Secondary | ICD-10-CM | POA: Diagnosis present

## 2019-10-05 DIAGNOSIS — Z6832 Body mass index (BMI) 32.0-32.9, adult: Secondary | ICD-10-CM

## 2019-10-05 DIAGNOSIS — Z23 Encounter for immunization: Secondary | ICD-10-CM | POA: Diagnosis present

## 2019-10-05 DIAGNOSIS — I10 Essential (primary) hypertension: Secondary | ICD-10-CM | POA: Diagnosis present

## 2019-10-05 DIAGNOSIS — E876 Hypokalemia: Secondary | ICD-10-CM | POA: Diagnosis present

## 2019-10-05 DIAGNOSIS — Z20822 Contact with and (suspected) exposure to covid-19: Secondary | ICD-10-CM | POA: Diagnosis present

## 2019-10-05 DIAGNOSIS — E46 Unspecified protein-calorie malnutrition: Secondary | ICD-10-CM | POA: Diagnosis present

## 2019-10-05 DIAGNOSIS — R601 Generalized edema: Secondary | ICD-10-CM

## 2019-10-05 DIAGNOSIS — M109 Gout, unspecified: Secondary | ICD-10-CM | POA: Diagnosis present

## 2019-10-05 DIAGNOSIS — F259 Schizoaffective disorder, unspecified: Secondary | ICD-10-CM | POA: Diagnosis not present

## 2019-10-05 DIAGNOSIS — F10239 Alcohol dependence with withdrawal, unspecified: Secondary | ICD-10-CM | POA: Diagnosis present

## 2019-10-05 DIAGNOSIS — Z811 Family history of alcohol abuse and dependence: Secondary | ICD-10-CM

## 2019-10-05 DIAGNOSIS — M7989 Other specified soft tissue disorders: Secondary | ICD-10-CM | POA: Diagnosis not present

## 2019-10-05 DIAGNOSIS — F10230 Alcohol dependence with withdrawal, uncomplicated: Secondary | ICD-10-CM | POA: Diagnosis present

## 2019-10-05 DIAGNOSIS — F10939 Alcohol use, unspecified with withdrawal, unspecified: Secondary | ICD-10-CM | POA: Diagnosis present

## 2019-10-05 DIAGNOSIS — F10930 Alcohol use, unspecified with withdrawal, uncomplicated: Secondary | ICD-10-CM

## 2019-10-05 DIAGNOSIS — E8809 Other disorders of plasma-protein metabolism, not elsewhere classified: Secondary | ICD-10-CM | POA: Diagnosis not present

## 2019-10-05 DIAGNOSIS — K7010 Alcoholic hepatitis without ascites: Secondary | ICD-10-CM | POA: Diagnosis present

## 2019-10-05 DIAGNOSIS — R609 Edema, unspecified: Secondary | ICD-10-CM | POA: Diagnosis not present

## 2019-10-05 DIAGNOSIS — R079 Chest pain, unspecified: Secondary | ICD-10-CM | POA: Diagnosis not present

## 2019-10-05 DIAGNOSIS — Z888 Allergy status to other drugs, medicaments and biological substances status: Secondary | ICD-10-CM | POA: Diagnosis not present

## 2019-10-05 DIAGNOSIS — E669 Obesity, unspecified: Secondary | ICD-10-CM | POA: Diagnosis present

## 2019-10-05 LAB — URINALYSIS, ROUTINE W REFLEX MICROSCOPIC
Bilirubin Urine: NEGATIVE
Glucose, UA: NEGATIVE mg/dL
Hgb urine dipstick: NEGATIVE
Ketones, ur: NEGATIVE mg/dL
Leukocytes,Ua: NEGATIVE
Nitrite: NEGATIVE
Protein, ur: NEGATIVE mg/dL
Specific Gravity, Urine: 1.006 (ref 1.005–1.030)
pH: 7 (ref 5.0–8.0)

## 2019-10-05 LAB — HEPATIC FUNCTION PANEL
ALT: 83 U/L — ABNORMAL HIGH (ref 0–44)
AST: 208 U/L — ABNORMAL HIGH (ref 15–41)
Albumin: 2.6 g/dL — ABNORMAL LOW (ref 3.5–5.0)
Alkaline Phosphatase: 104 U/L (ref 38–126)
Bilirubin, Direct: 0.7 mg/dL — ABNORMAL HIGH (ref 0.0–0.2)
Indirect Bilirubin: 1.2 mg/dL — ABNORMAL HIGH (ref 0.3–0.9)
Total Bilirubin: 1.9 mg/dL — ABNORMAL HIGH (ref 0.3–1.2)
Total Protein: 5.6 g/dL — ABNORMAL LOW (ref 6.5–8.1)

## 2019-10-05 LAB — BASIC METABOLIC PANEL
Anion gap: 13 (ref 5–15)
BUN: 7 mg/dL (ref 6–20)
CO2: 28 mmol/L (ref 22–32)
Calcium: 8.2 mg/dL — ABNORMAL LOW (ref 8.9–10.3)
Chloride: 97 mmol/L — ABNORMAL LOW (ref 98–111)
Creatinine, Ser: 0.72 mg/dL (ref 0.61–1.24)
GFR calc Af Amer: >60 mL/min (ref >60)
GFR calc non Af Amer: >60 mL/min (ref >60)
Glucose, Bld: 109 mg/dL — ABNORMAL HIGH (ref 70–99)
Potassium: 2.9 mmol/L — ABNORMAL LOW (ref 3.5–5.1)
Sodium: 138 mmol/L (ref 135–145)

## 2019-10-05 LAB — CBC
HCT: 36.5 % — ABNORMAL LOW (ref 39.0–52.0)
Hemoglobin: 11.8 g/dL — ABNORMAL LOW (ref 13.0–17.0)
MCH: 33.8 pg (ref 26.0–34.0)
MCHC: 32.3 g/dL (ref 30.0–36.0)
MCV: 104.6 fL — ABNORMAL HIGH (ref 80.0–100.0)
Platelets: 257 10*3/uL (ref 150–400)
RBC: 3.49 MIL/uL — ABNORMAL LOW (ref 4.22–5.81)
RDW: 17.9 % — ABNORMAL HIGH (ref 11.5–15.5)
WBC: 7.7 10*3/uL (ref 4.0–10.5)
nRBC: 0 % (ref 0.0–0.2)

## 2019-10-05 LAB — BRAIN NATRIURETIC PEPTIDE: B Natriuretic Peptide: 115.4 pg/mL — ABNORMAL HIGH (ref 0.0–100.0)

## 2019-10-05 LAB — SARS CORONAVIRUS 2 (TAT 6-24 HRS): SARS Coronavirus 2: NEGATIVE

## 2019-10-05 LAB — PROTIME-INR
INR: 0.9 (ref 0.8–1.2)
Prothrombin Time: 12.4 seconds (ref 11.4–15.2)

## 2019-10-05 LAB — PHOSPHORUS: Phosphorus: 4.2 mg/dL (ref 2.5–4.6)

## 2019-10-05 LAB — MAGNESIUM: Magnesium: 1.2 mg/dL — ABNORMAL LOW (ref 1.7–2.4)

## 2019-10-05 MED ORDER — THIAMINE HCL 100 MG/ML IJ SOLN
Freq: Once | INTRAVENOUS | Status: AC
  Filled 2019-10-05: qty 1000

## 2019-10-05 MED ORDER — MAGNESIUM SULFATE 4 GM/100ML IV SOLN
4.0000 g | Freq: Once | INTRAVENOUS | Status: AC
  Administered 2019-10-05: 4 g via INTRAVENOUS
  Filled 2019-10-05: qty 100

## 2019-10-05 MED ORDER — NICOTINE 21 MG/24HR TD PT24
21.0000 mg | MEDICATED_PATCH | Freq: Every day | TRANSDERMAL | Status: DC
  Administered 2019-10-05 – 2019-10-07 (×3): 21 mg via TRANSDERMAL
  Filled 2019-10-05 (×3): qty 1

## 2019-10-05 MED ORDER — POTASSIUM CHLORIDE CRYS ER 20 MEQ PO TBCR
40.0000 meq | EXTENDED_RELEASE_TABLET | Freq: Two times a day (BID) | ORAL | Status: AC
  Administered 2019-10-05 (×2): 40 meq via ORAL
  Filled 2019-10-05 (×2): qty 2

## 2019-10-05 MED ORDER — THIAMINE HCL 100 MG/ML IJ SOLN: 100.0000 mg | Freq: Every day | INTRAMUSCULAR | Status: DC

## 2019-10-05 MED ORDER — ADULT MULTIVITAMIN W/MINERALS CH
1.0000 | ORAL_TABLET | Freq: Every day | ORAL | Status: DC
  Administered 2019-10-05 – 2019-10-07 (×3): 1 via ORAL
  Filled 2019-10-05 (×3): qty 1

## 2019-10-05 MED ORDER — LOPERAMIDE HCL 2 MG PO CAPS: 4.0000 mg | ORAL_CAPSULE | ORAL | Status: DC | PRN

## 2019-10-05 MED ORDER — THIAMINE HCL 100 MG PO TABS: 100.0000 mg | ORAL_TABLET | Freq: Every day | ORAL | Status: DC

## 2019-10-05 MED ORDER — FUROSEMIDE 10 MG/ML IJ SOLN
60.0000 mg | Freq: Once | INTRAMUSCULAR | Status: AC
  Administered 2019-10-05: 60 mg via INTRAVENOUS
  Filled 2019-10-05: qty 6

## 2019-10-05 MED ORDER — THIAMINE HCL 100 MG PO TABS
100.0000 mg | ORAL_TABLET | Freq: Every day | ORAL | Status: DC
  Administered 2019-10-06 – 2019-10-07 (×2): 100 mg via ORAL
  Filled 2019-10-05 (×2): qty 1

## 2019-10-05 MED ORDER — LORAZEPAM 2 MG/ML IJ SOLN: 1.0000 mg | INTRAMUSCULAR | Status: DC | PRN

## 2019-10-05 MED ORDER — PANTOPRAZOLE SODIUM 40 MG PO TBEC
40.0000 mg | DELAYED_RELEASE_TABLET | Freq: Every day | ORAL | Status: DC
  Administered 2019-10-06 – 2019-10-07 (×2): 40 mg via ORAL
  Filled 2019-10-05 (×2): qty 1

## 2019-10-05 MED ORDER — LORAZEPAM 2 MG/ML IJ SOLN: 0.0000 mg | Freq: Two times a day (BID) | INTRAMUSCULAR | Status: DC

## 2019-10-05 MED ORDER — ENOXAPARIN SODIUM 40 MG/0.4ML ~~LOC~~ SOLN
40.0000 mg | SUBCUTANEOUS | Status: DC
  Administered 2019-10-05 – 2019-10-06 (×2): 40 mg via SUBCUTANEOUS
  Filled 2019-10-05 (×2): qty 0.4

## 2019-10-05 MED ORDER — MAGNESIUM SULFATE 50 % IJ SOLN: 4.0000 g | Freq: Once | INTRAMUSCULAR | Status: DC

## 2019-10-05 MED ORDER — LORAZEPAM 1 MG PO TABS: 0.0000 mg | ORAL_TABLET | Freq: Two times a day (BID) | ORAL | Status: DC

## 2019-10-05 MED ORDER — THIAMINE HCL 100 MG/ML IJ SOLN
100.0000 mg | Freq: Every day | INTRAMUSCULAR | Status: DC
  Administered 2019-10-05: 100 mg via INTRAVENOUS
  Filled 2019-10-05: qty 2

## 2019-10-05 MED ORDER — LORAZEPAM 1 MG PO TABS
0.0000 mg | ORAL_TABLET | Freq: Four times a day (QID) | ORAL | Status: DC
  Administered 2019-10-05 – 2019-10-06 (×2): 2 mg via ORAL
  Filled 2019-10-05 (×3): qty 1

## 2019-10-05 MED ORDER — FOLIC ACID 1 MG PO TABS
1.0000 mg | ORAL_TABLET | Freq: Every day | ORAL | Status: DC
  Administered 2019-10-05 – 2019-10-07 (×3): 1 mg via ORAL
  Filled 2019-10-05 (×3): qty 1

## 2019-10-05 MED ORDER — FOLIC ACID 1 MG PO TABS: 1.0000 mg | ORAL_TABLET | Freq: Every day | ORAL | Status: DC

## 2019-10-05 MED ORDER — LORAZEPAM 2 MG/ML IJ SOLN
1.0000 mg | Freq: Once | INTRAMUSCULAR | Status: AC
  Administered 2019-10-05: 1 mg via INTRAVENOUS
  Filled 2019-10-05: qty 1

## 2019-10-05 MED ORDER — ALLOPURINOL 100 MG PO TABS
200.0000 mg | ORAL_TABLET | Freq: Every day | ORAL | Status: DC
  Administered 2019-10-06 – 2019-10-07 (×2): 200 mg via ORAL
  Filled 2019-10-05 (×2): qty 2

## 2019-10-05 MED ORDER — ADULT MULTIVITAMIN W/MINERALS CH: 1.0000 | ORAL_TABLET | Freq: Every day | ORAL | Status: DC

## 2019-10-05 MED ORDER — LORAZEPAM 2 MG/ML IJ SOLN
0.0000 mg | Freq: Four times a day (QID) | INTRAMUSCULAR | Status: DC
  Administered 2019-10-05: 2 mg via INTRAVENOUS
  Filled 2019-10-05: qty 1

## 2019-10-05 MED ORDER — LORAZEPAM 1 MG PO TABS
1.0000 mg | ORAL_TABLET | ORAL | Status: DC | PRN
  Filled 2019-10-05: qty 1

## 2019-10-05 NOTE — Progress Notes (Addendum)
Virtual Visit via Telephone Note  I connected with [name], on 10/05/2019 at 11:08 AM by telephone due to the COVID-19 pandemic and verified that I am speaking with the correct person using two identifiers.   Consent: I discussed the limitations, risks, security and privacy concerns of performing an evaluation and management service by telephone and the availability of in person appointments. I also discussed with the patient that there may be a patient responsible charge related to this service. The patient expressed understanding and agreed to proceed.   Location of Patient: Home   Location of Provider: Clinic    Persons participating in Telemedicine visit: [name] [facility] Dr. Juleen China      History of Present Illness: Patient has a visit for leg edema. Bilateral edema and reports abdominal distention. Has been present for a couple weeks, L>R. Legs are warm to the touch and both are painful. Denies erythema. Reports chronic cough related to smoking. Denies increased SOB. No chest pain.   Has been drinking heavily for quite a while. Reports was drinking from time of waking up until he passed out, all day long for a few months. Unable to quantify how much he was drinking. Currently is going through alcohol withdrawal very badly. Reports shaking, hot/cold sweats, feels like skin is crawling, headaches. Has had seizure in the past. Quit drinking three days ago. Denies seizure this time.    Past Medical History:  Diagnosis Date  . Alcohol abuse   . Anxiety    at age 89  . Depression    at age 69  . Psychosis ([place])   . PTSD (post-traumatic stress disorder)   . Schizoaffective disorder   . Seizure disorder ([place])    related to etoh seizure   Allergies  Allergen Reactions  . Wellbutrin [Bupropion] Other (See Comments)    Caused seizures    Current Outpatient Medications on File Prior to Visit  Medication Sig Dispense Refill  . allopurinol (ZYLOPRIM) 100  MG tablet Take 2 tablets (200 mg total) by mouth daily. 180 tablet 1  . amLODipine (NORVASC) 10 MG tablet Take 1 tablet (10 mg total) by mouth daily. 90 tablet 1  . metoprolol tartrate (LOPRESSOR) 25 MG tablet Take 0.5 tablets (12.5 mg total) by mouth 2 (two) times daily. 90 tablet 1  . Misc. Devices MISC Blood Pressure monitor. Dx :Hypertension 1 each 0  . pantoprazole (PROTONIX) 40 MG tablet Take 1 tablet (40 mg total) by mouth daily. 90 tablet 1   No current facility-administered medications on file prior to visit.    Observations/Objective: NAD. Speaking clearly.  Work of breathing normal.  Alert and oriented. Mood appropriate.   Assessment and Plan: 1. Anasarca Given report of leg edema that reaches his abdomen, sounds concerning for anasarca. Have recommended patient go to ED immediately for evaluation. Discussed EMS transportation. Patient elected to go by private vehicle; instructed not to drive due to high risk of seizure during alcohol withdrawal. Would be concerned for renal or hepatic dysfunction. Would also want to evaluate for DVT as well as ascites. Certainly, leg edema could be from something as benign as Amlodipine usage; however given other symptoms and wide spread edema warrants further work up.   2. Alcohol withdrawal syndrome ([place]) Patient currently experiencing alcohol withdrawal. Reports he has seized in the past from stopping drinking. Discussed he is at high risk for this to occur again and that he should not drive. Recommended ER evaluation.    Follow Up  Instructions: Patient to go to ED by private vehicle. Allyne Gee, [place] calling ED charge RN.    I discussed the assessment and treatment plan with the patient. The patient was provided an opportunity to ask questions and all were answered. The patient agreed with the plan and demonstrated an understanding of the instructions.   The patient was advised to call back or seek an in-person evaluation if the symptoms  worsen or if the condition fails to improve as anticipated.     I provided 14 minutes total of non-face-to-face time during this encounter including median intraservice time, reviewing previous notes, investigations, ordering medications, medical decision making, coordinating care and patient verbalized understanding at the end of the visit.    Phill Myron, D.O. Primary Care at [facility]  10/05/2019, 11:08 AM

## 2019-10-05 NOTE — H&P (Addendum)
Date: 10/05/2019               Patient Name:  [name] MRN: [id number]  DOB: [birth year] Age / Sex: 49 y.o., male   PCP: Scot Jun, FNP         Medical Service: Internal Medicine Teaching Service         Attending Physician: Dr. Lucrezia Starch, MD    First Contact: Dr. Court Joy Pager: [phone]  Second Contact: Dr. Sherry Ruffing Pager: [phone]       After Hours (After 5p/  First Contact Pager: [phone]  weekends / holidays): Second Contact Pager: [phone]   Chief Complaint: alcohol withdrawal and lower extremity swelling  History of Present Illness: [name] is a 49 year old person with past medical history of hypertension, gout, GERD, and schizo affective disorder not on treatment who presents in alcohol withdrawal and would like his lower extremity edema evaluated.  Patient reports his last drink was on Sunday.  He says he has been off medication for some time for schizoaffective disorder.  He finds that drinking alcohol is only thing that helps him. He usually wakes up and starts drinking 4 LOKOs in the morning until he passes out . He has a history of withdrawals complicated by seizure. He also has lower extremity swelling    Patient reports that he has been having lower extremity swelling for the past week and also notice his abdomen is swollen. No recent illness or sick contacts. He denies any known history of heart failure, DVT or PE.   Meds:  Current Meds  Medication Sig  . allopurinol (ZYLOPRIM) 100 MG tablet Take 2 tablets (200 mg total) by mouth daily.  [name] amLODipine (NORVASC) 10 MG tablet Take 1 tablet (10 mg total) by mouth daily.  [name] loperamide (IMODIUM) 2 MG capsule Take 4 mg by mouth as needed for diarrhea or loose stools.  . metoprolol tartrate (LOPRESSOR) 25 MG tablet Take 0.5 tablets (12.5 mg total) by mouth 2 (two) times daily.  . naproxen sodium (ALEVE) 220 MG tablet Take 220-440 mg by mouth as needed (pain).  . pantoprazole (PROTONIX) 40 MG tablet  Take 1 tablet (40 mg total) by mouth daily.     Allergies: Allergies as of 10/05/2019 - Review Complete 10/05/2019  Allergen Reaction Noted  . Wellbutrin [bupropion] Other (See Comments) 10/18/2014   Past Medical History:  Diagnosis Date  . Alcohol abuse   . Anxiety    at age 65  . Depression    at age 36  . Psychosis ([place])   . PTSD (post-traumatic stress disorder)   . Schizoaffective disorder   . Seizure disorder ([place])    related to etoh seizure    Family History:  Family History  Problem Relation Age of Onset  . Alcoholism Mother   . CAD Father   . Depression Brother      Social History:  Social History   Tobacco Use  . Smoking status: Current Every Day Smoker    Packs/day: 1.00    Years: 24.00    Pack years: 24.00    Types: Cigarettes  . Smokeless tobacco: Never Used  Substance Use Topics  . Alcohol use: Yes    Alcohol/week: 0.0 standard drinks    Comment: drinking daily   . Drug use: Yes    Frequency: 1.0 times per week    Types: Marijuana    Comment: THC 2 to 3 times per month. last use 09/24/2015  Review of Systems: A complete ROS was negative except as per HPI.   Physical Exam: Blood pressure 125/83, pulse 82, temperature 98.5 F (36.9 C), temperature source Oral, resp. rate 17, height 6' (1.829 m), weight 104.3 kg, SpO2 97 %.   General: Alert, tremors on exam,  HE: Normocephalic, atraumatic. PEERL, EOMI, antiicteric sclera  ENT: No congestion, no rhinorrhea, moist, no exudate or erythema  Cardiovascular: Normal rate, regular rhythm.  No murmurs, rubs, or gallops Pulmonary : Effort normal, breath sounds normal. No wheezes, rales, rhonchi Abdominal: soft, nontender, no mass,  distention, no guarding, no rebound,  bowel sounds normal Musculoskeletal: 2+ pitting Lower extremity edema, no deformity, injury ,or tenderness in extremities, Skin: Warm, dry , no bruising, erythema, or rash Neurological: alert and oriented x4 , no weakness, no sensory  deficit  Psychiatric/Behavioral:  mood normal, affect anxious    EKG: personally reviewed my interpretation is sinus rhythm, no ST elevations.  CXR: personally reviewed my interpretation is no focal opacities, no pleural effusions, no pneumothorax  Assessment & Plan by Problem: Active Problems:   * No active hospital problems. *  [name] is a 49 year old person with past medical history of hypertension, gout, GERD, and schizo affective disorder not on treatment who presents in alcohol withdrawal and would like his lower extremity edema evaluated.  #Alcohol use disorder #Alcohol withdrawal with history of withdrawal seizures Patient reports his last drink was on Sunday. Tremors, tachycardic, and anxious on exam.  AST to ALT ration elevated with  a 2:1 ratio. Non specific abdominal pain , not worsened with palpation. Previous hepatitis panel negative. Will further workup for cirrhosis with abdominal ultrasound  P: - CIWA w/ ativan - Sign out to start librium if CIWAs > 10 - abdominal ultrasound  - Thiamine  #Lower extremity edema and swelling BNP mildly elevated 115, checks x-ray without pulmonary effusions or congestion.  Edema extends to knees. Given lasix in ED. Also having some non specific abdominal pains.  P: -Echo  Dispo: Admit patient to Inpatient with expected length of stay greater than 2 midnights.  Signed:  Tamsen Snider, MD PGY1

## 2019-10-05 NOTE — ED Notes (Signed)
Vascular bedside

## 2019-10-05 NOTE — Progress Notes (Signed)
Lower extremity venous has been completed.   Preliminary results in CV Proc.   [name] 10/05/2019 2:33 PM

## 2019-10-05 NOTE — ED Triage Notes (Signed)
Pt arrives POV for eval of blt leg swelling x 1 week and abd distension. Pt also reports he is w/d from ETOH. Reports last drink Sunday night. States normally drinks 6-7 four lokos/day and stopped abruptly. Reports hx of w/d seizures, tremulous in triage, denies other assc. W/d sx.

## 2019-10-05 NOTE — ED Provider Notes (Signed)
[facility] EMERGENCY DEPARTMENT Provider Note   CSN: [id number] Arrival date & time: 10/05/19  1224     History Chief Complaint  Patient presents with  . Leg Swelling    [name] is a 49 y.o. male.  Presents ER chief complaint leg swelling and alcohol withdrawal.  States over the last week he has noted increased leg swelling, both legs, left worse than right, also feels like his abdomen is more distended than normal.  States that stopped drinking cold [place], last drink Sunday night, drinks multiple for low close daily on a regular basis.  Has had history of bad withdrawal, withdrawal seizure. Denies prior hx heart failure, DVT or PE.  HPI     Past Medical History:  Diagnosis Date  . Alcohol abuse   . Anxiety    at age 61  . Depression    at age 43  . Psychosis ([place])   . PTSD (post-traumatic stress disorder)   . Schizoaffective disorder   . Seizure disorder ([place])    related to etoh seizure    Patient Active Problem List   Diagnosis Date Noted  . Rhabdomyolysis 03/06/2019  . Alcoholic hepatitis without ascites 03/06/2019  . Alcohol withdrawal ([place]) 01/15/2019  . HTN (hypertension) 01/10/2019  . Fall 01/10/2019  . Alcohol intoxication ([place]) 01/10/2019  . Schizoaffective disorder, bipolar type ([place]) 06/02/2018  . Alcohol dependence with uncomplicated withdrawal ([place]) 02/22/2016  . Alcohol-induced mood disorder ([place]) 02/22/2016  . Gout 10/22/2015  . GERD (gastroesophageal reflux disease) 10/22/2015  . Alcohol withdrawal seizure with complication ([place]) [id number]  . Schizoaffective disorder, depressive type ([place])   . History of alcohol abuse   . Tobacco dependence due to cigarettes 11/20/2014  . Alcohol abuse with intoxication ([place])   . Hyponatremia 10/18/2014  . Abnormal LFTs   . Alcohol abuse 02/09/2014  . Post traumatic stress disorder (PTSD) 11/03/2011  . Hypokalemia 07/02/2011    Past Surgical History:  Procedure Laterality Date    . FOOT SURGERY     right  . HAND SURGERY         Family History  Problem Relation Age of Onset  . Alcoholism Mother   . CAD Father   . Depression Brother     Social History   Tobacco Use  . Smoking status: Current Every Day Smoker    Packs/day: 1.00    Years: 24.00    Pack years: 24.00    Types: Cigarettes  . Smokeless tobacco: Never Used  Substance Use Topics  . Alcohol use: Yes    Alcohol/week: 0.0 standard drinks    Comment: drinking daily   . Drug use: Yes    Frequency: 1.0 times per week    Types: Marijuana    Comment: THC 2 to 3 times per month. last use 09/24/2015    Home Medications Prior to Admission medications   Medication Sig Start Date End Date Taking? Authorizing Provider  allopurinol (ZYLOPRIM) 100 MG tablet Take 2 tablets (200 mg total) by mouth daily. 06/09/19  Yes [name], MD  amLODipine (NORVASC) 10 MG tablet Take 1 tablet (10 mg total) by mouth daily. 06/09/19 10/07/19 Yes [name], MD  loperamide (IMODIUM) 2 MG capsule Take 4 mg by mouth as needed for diarrhea or loose stools.   Yes [provider]  metoprolol tartrate (LOPRESSOR) 25 MG tablet Take 0.5 tablets (12.5 mg total) by mouth 2 (two) times daily. 06/09/19  Yes [name], MD  naproxen sodium (ALEVE) 220  MG tablet Take 220-440 mg by mouth as needed (pain).   Yes [provider]  pantoprazole (PROTONIX) 40 MG tablet Take 1 tablet (40 mg total) by mouth daily. 06/09/19  Yes [name], MD  Misc. Devices MISC Blood Pressure monitor. Dx :Hypertension 06/09/19   [name], MD    Allergies    Wellbutrin [bupropion]  Review of Systems   Review of Systems  Constitutional: Negative for chills and fever.  HENT: Negative for ear pain and sore throat.   Eyes: Negative for pain and visual disturbance.  Respiratory: Negative for cough and shortness of breath.   Cardiovascular: Negative for chest pain and palpitations.  Gastrointestinal: Positive for  abdominal distention. Negative for abdominal pain and vomiting.  Genitourinary: Negative for dysuria and hematuria.  Musculoskeletal: Negative for arthralgias and back pain.  Skin: Negative for color change and rash.  Neurological: Negative for seizures and syncope.  All other systems reviewed and are negative.   Physical Exam Updated Vital Signs BP 125/83   Pulse 82   Temp 98.5 F (36.9 C) (Oral)   Resp (!) 24   Ht 6' (1.829 m)   Wt 104.3 kg   SpO2 95%   BMI 31.19 kg/m   Physical Exam Vitals and nursing note reviewed.  Constitutional:      Appearance: He is well-developed.  HENT:     Head: Normocephalic and atraumatic.  Eyes:     Conjunctiva/sclera: Conjunctivae normal.  Cardiovascular:     Rate and Rhythm: Normal rate and regular rhythm.     Heart sounds: No murmur.  Pulmonary:     Effort: Pulmonary effort is normal. No respiratory distress.     Breath sounds: Normal breath sounds.  Abdominal:     Palpations: Abdomen is soft.     Tenderness: There is no abdominal tenderness.  Musculoskeletal:     Cervical back: Neck supple.     Comments: Bilateral lower leg pitting edema to level of knee  Skin:    General: Skin is warm and dry.     Capillary Refill: Capillary refill takes less than 2 seconds.  Neurological:     General: No focal deficit present.     Mental Status: He is alert and oriented to person, place, and time.  Psychiatric:     Comments: Somewhat anxious     ED Results / Procedures / Treatments   Labs (all labs ordered are listed, but only abnormal results are displayed) Labs Reviewed  BASIC METABOLIC PANEL - Abnormal; Notable for the following components:      Result Value   Potassium 2.9 (*)    Chloride 97 (*)    Glucose, Bld 109 (*)    Calcium 8.2 (*)    All other components within normal limits  CBC - Abnormal; Notable for the following components:   RBC 3.49 (*)    Hemoglobin 11.8 (*)    HCT 36.5 (*)    MCV 104.6 (*)    RDW 17.9 (*)     All other components within normal limits  BRAIN NATRIURETIC PEPTIDE - Abnormal; Notable for the following components:   B Natriuretic Peptide 115.4 (*)    All other components within normal limits  HEPATIC FUNCTION PANEL - Abnormal; Notable for the following components:   Total Protein 5.6 (*)    Albumin 2.6 (*)    AST 208 (*)    ALT 83 (*)    Total Bilirubin 1.9 (*)    Bilirubin, Direct 0.7 (*)  Indirect Bilirubin 1.2 (*)    All other components within normal limits  SARS CORONAVIRUS 2 (TAT 6-24 HRS)  PROTIME-INR  URINALYSIS, ROUTINE W REFLEX MICROSCOPIC  MAGNESIUM  PHOSPHORUS  CBG MONITORING, ED    EKG EKG Interpretation  Date/Time:  Wednesday October 05 2019 13:07:07 EST Ventricular Rate:  80 PR Interval:    QRS Duration: 88 QT Interval:  390 QTC Calculation: 450 R Axis:   76 Text Interpretation: Age not entered, assumed to be  49 years old for purpose of ECG interpretation Sinus rhythm Confirmed by Madalyn Rob [phone]) on 10/05/2019 1:10:04 PM   Radiology DG Chest 2 View  Result Date: 10/05/2019 CLINICAL DATA:  Shortness of breath EXAM: CHEST - 2 VIEW COMPARISON:  March 16, 2019 FINDINGS: Lungs are clear. Heart size and pulmonary vascularity are normal. No adenopathy. No bone lesions. IMPRESSION: No abnormality noted. Electronically Signed   By: Lowella Grip III M.D.   On: 10/05/2019 13:42   VAS [place] LOWER EXTREMITY VENOUS (DVT) (ONLY [facility] & [facility] 7a-7p)  Result Date: 10/05/2019  Lower Venous DVTStudy Indications: Swelling, and Edema.  Comparison Study: no previous Performing Technologist: Abram Sander RVS  Examination Guidelines: A complete evaluation includes B-mode imaging, spectral Doppler, color Doppler, and power Doppler as needed of all accessible portions of each vessel. Bilateral testing is considered an integral part of a complete examination. Limited examinations for reoccurring indications may be performed as noted. The reflux portion of the exam is  performed with the patient in reverse Trendelenburg.  +---------+---------------+---------+-----------+----------+--------------+ RIGHT    CompressibilityPhasicitySpontaneityPropertiesThrombus Aging +---------+---------------+---------+-----------+----------+--------------+ CFV      Full           Yes      Yes                                 +---------+---------------+---------+-----------+----------+--------------+ SFJ      Full                                                        +---------+---------------+---------+-----------+----------+--------------+ FV Prox  Full                                                        +---------+---------------+---------+-----------+----------+--------------+ FV Mid   Full                                                        +---------+---------------+---------+-----------+----------+--------------+ FV DistalFull                                                        +---------+---------------+---------+-----------+----------+--------------+ PFV      Full                                                        +---------+---------------+---------+-----------+----------+--------------+  POP      Full           Yes      Yes                                 +---------+---------------+---------+-----------+----------+--------------+ PTV      Full                                                        +---------+---------------+---------+-----------+----------+--------------+ PERO     Full                                                        +---------+---------------+---------+-----------+----------+--------------+   +---------+---------------+---------+-----------+----------+--------------+ LEFT     CompressibilityPhasicitySpontaneityPropertiesThrombus Aging +---------+---------------+---------+-----------+----------+--------------+ CFV      Full           Yes      Yes                                  +---------+---------------+---------+-----------+----------+--------------+ SFJ      Full                                                        +---------+---------------+---------+-----------+----------+--------------+ FV Prox  Full                                                        +---------+---------------+---------+-----------+----------+--------------+ FV Mid   Full                                                        +---------+---------------+---------+-----------+----------+--------------+ FV DistalFull                                                        +---------+---------------+---------+-----------+----------+--------------+ PFV      Full                                                        +---------+---------------+---------+-----------+----------+--------------+ POP      Full           Yes      Yes                                 +---------+---------------+---------+-----------+----------+--------------+  PTV      Full                                                        +---------+---------------+---------+-----------+----------+--------------+ PERO                                                  Not visualized +---------+---------------+---------+-----------+----------+--------------+     Summary: BILATERAL: - No evidence of deep vein thrombosis seen in the lower extremities, bilaterally.   *See table(s) above for measurements and observations.    Preliminary     Procedures Procedures (including critical care time)  Medications Ordered in ED Medications  LORazepam (ATIVAN) injection 0-4 mg (2 mg Intravenous Given 10/05/19 1608)    Or  LORazepam (ATIVAN) tablet 0-4 mg ( Oral See Alternative 10/05/19 1608)  LORazepam (ATIVAN) injection 0-4 mg (has no administration in time range)    Or  LORazepam (ATIVAN) tablet 0-4 mg (has no administration in time range)  thiamine tablet 100 mg ( Oral See Alternative 10/05/19  1607)    Or  thiamine (B-1) injection 100 mg (100 mg Intravenous Given 10/05/19 1607)  allopurinol (ZYLOPRIM) tablet 200 mg (has no administration in time range)  loperamide (IMODIUM) capsule 4 mg (has no administration in time range)  pantoprazole (PROTONIX) EC tablet 40 mg (has no administration in time range)  enoxaparin (LOVENOX) injection 40 mg (has no administration in time range)  sodium chloride 0.9 % 1,000 mL with thiamine 123XX123 mg, folic acid 1 mg, multivitamins adult 10 mL infusion (has no administration in time range)  folic acid (FOLVITE) tablet 1 mg (has no administration in time range)  multivitamin with minerals tablet 1 tablet (has no administration in time range)  potassium chloride SA (KLOR-CON) CR tablet 40 mEq (has no administration in time range)  LORazepam (ATIVAN) tablet 1-4 mg (has no administration in time range)    Or  LORazepam (ATIVAN) injection 1-4 mg (has no administration in time range)  nicotine (NICODERM CQ - dosed in mg/24 hours) patch 21 mg (has no administration in time range)  LORazepam (ATIVAN) injection 1 mg (1 mg Intravenous Given 10/05/19 1353)  furosemide (LASIX) injection 60 mg (60 mg Intravenous Given 10/05/19 1610)    ED Course  I have reviewed the triage vital signs and the nursing notes.  Pertinent labs & imaging results that were available during my care of the patient were reviewed by me and considered in my medical decision making (see chart for details).  Clinical Course as of Oct 04 1656  Wed Oct 05, 2019  1600 Discussed patient with internal medicine resident service   [RD]    Clinical Course User Index [RD] Lucrezia Starch, MD   MDM Rules/Calculators/A&P                       49 year old male presented to ER with 2 concerns.  Regarding leg swelling, DVT study was negative, BNP elevated, suspect fluid overload possibly heart failure.  Provided IV Lasix.  Regarding alcohol withdrawal, provided with IV Ativan, placed on CIWA  protocol.  Given no prior documented history of heart failure, no recent echo, believe patient would  benefit from admission for further work-up, monitoring of suspected heart failure exacerbation and alcohol withdrawal.  Discussed with internal medicine resident team who will admit. Dr. Dareen Piano team to accept.  Final Clinical Impression(s) / ED Diagnoses Final diagnoses:  Peripheral edema  Acute on chronic congestive heart failure, unspecified heart failure type (HCC)  Alcohol withdrawal syndrome without complication [facility])    Rx / DC Orders ED Discharge Orders    None       Lucrezia Starch, MD 10/05/19 1706

## 2019-10-05 NOTE — Progress Notes (Signed)
RN arrived to floor at [facility]. Pt alert and oriented. Denied SOB. Reported high anxiety. +2-+3 bilateral lower extremity edema. Pt's covid test pending - pt wearing his facial mask. Pt also reported mild nausea.   RN reported findings to night shift RN.  RN placed pt on tele box and continuous pulse ox machine for progressive care setting.

## 2019-10-06 ENCOUNTER — Inpatient Hospital Stay (HOSPITAL_COMMUNITY): Payer: Medicare Other

## 2019-10-06 ENCOUNTER — Other Ambulatory Visit: Payer: Self-pay

## 2019-10-06 DIAGNOSIS — M109 Gout, unspecified: Secondary | ICD-10-CM

## 2019-10-06 DIAGNOSIS — R7401 Elevation of levels of liver transaminase levels: Secondary | ICD-10-CM

## 2019-10-06 DIAGNOSIS — I10 Essential (primary) hypertension: Secondary | ICD-10-CM

## 2019-10-06 DIAGNOSIS — F259 Schizoaffective disorder, unspecified: Secondary | ICD-10-CM

## 2019-10-06 DIAGNOSIS — F10939 Alcohol use, unspecified with withdrawal, unspecified: Secondary | ICD-10-CM

## 2019-10-06 DIAGNOSIS — M7989 Other specified soft tissue disorders: Secondary | ICD-10-CM

## 2019-10-06 DIAGNOSIS — R079 Chest pain, unspecified: Secondary | ICD-10-CM

## 2019-10-06 LAB — ECHOCARDIOGRAM COMPLETE
Height: 72 in
Weight: 3680 oz

## 2019-10-06 LAB — COMPREHENSIVE METABOLIC PANEL
ALT: 72 U/L — ABNORMAL HIGH (ref 0–44)
AST: 166 U/L — ABNORMAL HIGH (ref 15–41)
Albumin: 2.5 g/dL — ABNORMAL LOW (ref 3.5–5.0)
Alkaline Phosphatase: 94 U/L (ref 38–126)
Anion gap: 12 (ref 5–15)
BUN: 7 mg/dL (ref 6–20)
CO2: 29 mmol/L (ref 22–32)
Calcium: 7.9 mg/dL — ABNORMAL LOW (ref 8.9–10.3)
Chloride: 97 mmol/L — ABNORMAL LOW (ref 98–111)
Creatinine, Ser: 0.79 mg/dL (ref 0.61–1.24)
GFR calc Af Amer: >60 mL/min (ref >60)
GFR calc non Af Amer: >60 mL/min (ref >60)
Glucose, Bld: 97 mg/dL (ref 70–99)
Potassium: 3.4 mmol/L — ABNORMAL LOW (ref 3.5–5.1)
Sodium: 138 mmol/L (ref 135–145)
Total Bilirubin: 1.5 mg/dL — ABNORMAL HIGH (ref 0.3–1.2)
Total Protein: 5.2 g/dL — ABNORMAL LOW (ref 6.5–8.1)

## 2019-10-06 LAB — PHOSPHORUS: Phosphorus: 4.4 mg/dL (ref 2.5–4.6)

## 2019-10-06 LAB — CBC
HCT: 31.8 % — ABNORMAL LOW (ref 39.0–52.0)
Hemoglobin: 10.4 g/dL — ABNORMAL LOW (ref 13.0–17.0)
MCH: 34.1 pg — ABNORMAL HIGH (ref 26.0–34.0)
MCHC: 32.7 g/dL (ref 30.0–36.0)
MCV: 104.3 fL — ABNORMAL HIGH (ref 80.0–100.0)
Platelets: 233 10*3/uL (ref 150–400)
RBC: 3.05 MIL/uL — ABNORMAL LOW (ref 4.22–5.81)
RDW: 18.6 % — ABNORMAL HIGH (ref 11.5–15.5)
WBC: 6.3 10*3/uL (ref 4.0–10.5)
nRBC: 0 % (ref 0.0–0.2)

## 2019-10-06 LAB — MAGNESIUM: Magnesium: 2.3 mg/dL (ref 1.7–2.4)

## 2019-10-06 MED ORDER — ENSURE ENLIVE PO LIQD
237.0000 mL | Freq: Two times a day (BID) | ORAL | Status: DC
  Administered 2019-10-06 – 2019-10-07 (×2): 237 mL via ORAL

## 2019-10-06 MED ORDER — ACETAMINOPHEN 325 MG PO TABS
650.0000 mg | ORAL_TABLET | Freq: Four times a day (QID) | ORAL | Status: DC | PRN
  Administered 2019-10-06 – 2019-10-07 (×2): 650 mg via ORAL
  Filled 2019-10-06 (×2): qty 2

## 2019-10-06 MED ORDER — INFLUENZA VAC SPLIT QUAD 0.5 ML IM SUSY
0.5000 mL | PREFILLED_SYRINGE | INTRAMUSCULAR | Status: AC
  Administered 2019-10-07: 0.5 mL via INTRAMUSCULAR
  Filled 2019-10-06: qty 0.5

## 2019-10-06 MED ORDER — POTASSIUM CHLORIDE CRYS ER 20 MEQ PO TBCR
30.0000 meq | EXTENDED_RELEASE_TABLET | Freq: Two times a day (BID) | ORAL | Status: AC
  Administered 2019-10-06 (×2): 30 meq via ORAL
  Filled 2019-10-06 (×2): qty 1

## 2019-10-06 NOTE — Progress Notes (Signed)
Rounded on pt per protocol. Pain, hygiene and ambulation was addressed this shift. No distress noted.

## 2019-10-06 NOTE — Progress Notes (Signed)
Date: 10/06/2019  Patient name: [name]  Medical record number: [id number]  Date of birth: 09/22/1970   I have seen and evaluated [name] and discussed their care with the Residency Team.  In brief, patient is a 49 year old male with a past medical history of hypertension, gout, GERD and schizoaffective disorder, alcohol use disorder who presented to the ED with lower extremity swelling as well as alcohol withdrawal.  Patient states that he has not been on treatment for schizoaffective disorder and has been drinking alcohol instead as it is the only thing that helps him.  He has a history of alcohol withdrawal complicated by seizures in the past.  Patient states that his last drink was on Sunday.  He stated that he was anxious when he came to the ED.  He was also noted to be tachycardic and tremulous in the ED.  Patient also complained of lower extremity swelling which he noted over the past week and also increased abdominal distention.  No fevers chills, no chest pain, no shortness of breath, no palpitations, no diaphoresis, no syncope, no focal weakness, no abdominal pain, no nausea or vomiting, no diarrhea.  Patient states that he feels okay today except for some minimal tremors.  He still has some persistent lower extremity swelling.  PMHx, Fam Hx, and/or Soc Hx : As per resident admit note  Vitals:   10/06/19 0813 10/06/19 0819  BP:  121/88  Pulse:  99  Resp:  17  Temp: 98 F (36.7 C)   SpO2:     General: Awake, alert, oriented x3, NAD CVS: Regular rate and rhythm, normal heart sounds Lungs: CTA bilaterally Abdomen: Soft, mildly distended, nontender, normoactive bowel sounds Extremities: 2+ bilateral lower extremity pitting edema, nontender to palpation Neuro: Oriented x3 Psych: Normal mood and affect HEENT: Normocephalic, atraumatic  Assessment and Plan: I have seen and evaluated the patient as outlined above. I agree with the formulated Assessment and Plan as  detailed in the residents' note, with the following changes:   1.  Acute alcohol withdrawal: -Patient presented to the ED with increased anxiety, tremors and tachycardia in the setting of stopping alcohol 3 days prior to admission consistent with acute alcohol withdrawal. -Patient was also noted to have transaminitis with AST to ALT in the 2:1 ratio consistent with alcoholic hepatitis. -Patient's LFTs are slowly improving. -Patient was started on CIWA protocol with Ativan.  He only required a couple of doses yesterday and overnight but his CIWA scores today have been 0-2. -We will continue to monitor him closely. -We will supplement electrolytes as needed (patient was hypokalemic on admission) -We will continue with thiamine, folic acid and multivitamin -Patient did have an abdominal ultrasound which showed fatty filtration of the liver but no gallstones or focal lesions -No further work-up at this time.  2.  Lower extremity swelling: -Patient was also noted to have 2+ bilateral lower extremity pitting edema on admission but his lungs were clear to auscultation.  He also complained of associated abdominal distention on admission.  Patient only had a mildly elevated BNP 115. -The etiology behind his lower extremity edema remains uncertain at this time.  He does have a low albumin level (2.5) which may have contributed to lower extremity edema.  He does admit to missing meals as well.  We will supplement his diet with Ensure -Given his alcohol use we were concerned that patient may have underlying cirrhosis which contributed to his abdominal distention as well as lower extremity edema but  abdominal ultrasound did not show cirrhosis or ascites -We will follow-up 2D echo to rule out congestive heart failure but this appears to be unlikely as he only has a mildly elevated BNP and his lungs are clear to auscultation on exam and chest x-ray did not show any pulmonary edema. -Patient was given furosemide  60 mg IV once in the ED.  We will hold off on further Lasix at this time. -Patient had bilateral lower extremity ultrasound which showed no evidence of DVT.  It is possible that some of the swelling may be secondary to chronic venous insufficiency and he may benefit from compression stockings. -No further work-up at this time  Aldine Contes, MD 2/18/20212:20 PM

## 2019-10-06 NOTE — Evaluation (Signed)
Physical Therapy Evaluation Patient Details Name: [name] MRN: [id number] DOB: Apr 22, 1971 Today's Date: 10/06/2019   History of Present Illness  49 year old male with a past medical history of hypertension, gout, GERD and schizoaffective disorder, alcohol use disorder who presented to the ED with lower extremity swelling as well as alcohol withdrawal.  Clinical Impression  Pt presents to PT mobilizing independently. Pt reports he is at his functional baseline and expresses no concerns for mobilizing at this time. Pt is able to perform all mobility required in the home this session, declining stair assessment at this time but reporting no concern for stair negotiation. Pt requires no further acute PT POC at this time and is encouraged to ambulate out of the room at least 3 times per day for the remainder of hospitalization. Acute PT signing off 10/06/2019.    Follow Up Recommendations No PT follow up    Equipment Recommendations  None recommended by PT    Recommendations for Other Services       Precautions / Restrictions Precautions Precautions: Other (comment)(seizure) Restrictions Weight Bearing Restrictions: No      Mobility  Bed Mobility Overal bed mobility: Independent                Transfers Overall transfer level: Independent                  Ambulation/Gait Ambulation/Gait assistance: Independent Gait Distance (Feet): 200 Feet Assistive device: None Gait Pattern/deviations: Wide base of support Gait velocity: functional Gait velocity interpretation: >2.62 ft/sec, indicative of community ambulatory General Gait Details: pt ambulating with increased lateral sway and Wide base of support  Stairs Stairs: (pt declines the need for stair assessment)          Wheelchair Mobility    Modified Rankin (Stroke Patients Only)       Balance Overall balance assessment: Independent                                            Pertinent Vitals/Pain Pain Assessment: No/denies pain    Home Living Family/patient expects to be discharged to:: Private residence Living Arrangements: Alone Available Help at Discharge: Family;Available 24 hours/day Type of Home: Apartment Home Access: Stairs to enter Entrance Stairs-Rails: Left Entrance Stairs-Number of Steps: flight Home Layout: One level Home Equipment: Environmental consultant - 2 wheels      Prior Function Level of Independence: Independent               Hand Dominance   Dominant Hand: Right    Extremity/Trunk Assessment   Upper Extremity Assessment Upper Extremity Assessment: Overall WFL for tasks assessed    Lower Extremity Assessment Lower Extremity Assessment: Overall WFL for tasks assessed    Cervical / Trunk Assessment Cervical / Trunk Assessment: Normal  Communication   Communication: No difficulties  Cognition Arousal/Alertness: Awake/alert Behavior During Therapy: WFL for tasks assessed/performed Overall Cognitive Status: Within Functional Limits for tasks assessed                                        General Comments General comments (skin integrity, edema, etc.): pt tachy into low 100s with mobility    Exercises     Assessment/Plan    PT Assessment Patent does not need any further PT services  PT  Problem List         PT Treatment Interventions      PT Goals (Current goals can be found in the Care Plan section)       Frequency     Barriers to discharge        Co-evaluation               AM-PAC PT "6 Clicks" Mobility  Outcome Measure Help needed turning from your back to your side while in a flat bed without using bedrails?: None Help needed moving from lying on your back to sitting on the side of a flat bed without using bedrails?: None Help needed moving to and from a bed to a chair (including a wheelchair)?: None Help needed standing up from a chair using your arms (e.g., wheelchair or  bedside chair)?: None Help needed to walk in hospital room?: None Help needed climbing 3-5 steps with a railing? : None 6 Click Score: 24    End of Session   Activity Tolerance: Patient tolerated treatment well Patient left: in bed;with call bell/phone within reach Nurse Communication: Mobility status      Time: [id number] PT Time Calculation (min) (ACUTE ONLY): 16 min   Charges:   PT Evaluation $PT Eval Low Complexity: [place], PT, DPT Acute Rehabilitation Pager: [phone]   Zenaida Niece 10/06/2019, 5:03 PM

## 2019-10-06 NOTE — Progress Notes (Signed)
  Echocardiogram 2D Echocardiogram has been performed.  [name] 10/06/2019, 2:24 PM

## 2019-10-06 NOTE — Progress Notes (Signed)
   Subjective:  Patient reports he is feeling better today.  He still is a little anxious and has noticeable tremors. Discussed current workup. All question and concerns addressed.   Objective:  Vital signs in last 24 hours: Vitals:   10/05/19 2007 10/06/19 0813 10/06/19 0819 10/06/19 1300  BP: 125/86  121/88 121/78  Pulse: (!) 114  99   Resp: 20  17 18   Temp: 98.2 F (36.8 C) 98 F (36.7 C)  98.3 F (36.8 C)  TempSrc: Oral Oral  Oral  SpO2: 95%     Weight:      Height:       Gen: Alert, NAD CV: Tachycardic, normal s1 and s2, no murmurs rubs or gallops Pulmonary: Breath sounds normal no wheezes, rales, or rhonchi Abdomen: Soft ,nontender, active bowel sounds Ext: tremors in hands , 2+ bilateral lower extremity pitting edema to knees  CBC Latest Ref Rng & Units 10/06/2019 10/05/2019 03/18/2019  WBC 4.0 - 10.5 K/uL 6.3 7.7 8.6  Hemoglobin 13.0 - 17.0 g/dL 10.4(L) 11.8(L) 13.1  Hematocrit 39.0 - 52.0 % 31.8(L) 36.5(L) 39.1  Platelets 150 - 400 K/uL 233 257 354   BMP Latest Ref Rng & Units 10/06/2019 10/05/2019 03/19/2019  Glucose 70 - 99 mg/dL 97 109(H) 97  BUN 6 - 20 mg/dL 7 7 13   Creatinine 0.61 - 1.24 mg/dL 0.79 0.72 0.78  BUN/Creat Ratio 9 - 20 - - -  Sodium 135 - 145 mmol/L 138 138 134(L)  Potassium 3.5 - 5.1 mmol/L 3.4(L) 2.9(L) 3.6  Chloride 98 - 111 mmol/L 97(L) 97(L) 98  CO2 22 - 32 mmol/L 29 28 25   Calcium 8.9 - 10.3 mg/dL 7.9(L) 8.2(L) 8.9    Assessment/Plan:  Active Problems:   Alcohol withdrawal (HCC)  #Alcohol use disorder  #alcohol withdrawal with history of withdrawal seizures LFTs slowly trending down.  Patient started on CIWA protocol with Ativan and only requiring a few doses of ativan. Initially scoring 11-13, today patient scoring 0-2.  Abdominal ultrasound showed fatty infiltration, will recommend outpatient follow-up. P: -CIWA with Ativan - Monitor vitals   #Lower extremity edema Working up with TEE today, although I expect his edema is  secondary to low oncotic pressure.Hypoalbunimea on labs. Likely secondary to malnutrition with chronic alcohol use disorder.  P: - start ensure, follow up RD recommendations - Trend CMP - compression stockings   Prior to Admission Living Arrangement:home Anticipated Discharge Location:home Barriers to Discharge:continue medical treatment Dispo: Anticipated discharge in approximately 1-2 day(s).   Tamsen Snider, MD PGY1

## 2019-10-07 DIAGNOSIS — E8809 Other disorders of plasma-protein metabolism, not elsewhere classified: Secondary | ICD-10-CM

## 2019-10-07 DIAGNOSIS — Z888 Allergy status to other drugs, medicaments and biological substances status: Secondary | ICD-10-CM

## 2019-10-07 DIAGNOSIS — D539 Nutritional anemia, unspecified: Secondary | ICD-10-CM

## 2019-10-07 LAB — CBC
HCT: 32.3 % — ABNORMAL LOW (ref 39.0–52.0)
Hemoglobin: 10.5 g/dL — ABNORMAL LOW (ref 13.0–17.0)
MCH: 33.9 pg (ref 26.0–34.0)
MCHC: 32.5 g/dL (ref 30.0–36.0)
MCV: 104.2 fL — ABNORMAL HIGH (ref 80.0–100.0)
Platelets: 242 10*3/uL (ref 150–400)
RBC: 3.1 MIL/uL — ABNORMAL LOW (ref 4.22–5.81)
RDW: 18.7 % — ABNORMAL HIGH (ref 11.5–15.5)
WBC: 6.2 10*3/uL (ref 4.0–10.5)
nRBC: 0 % (ref 0.0–0.2)

## 2019-10-07 LAB — COMPREHENSIVE METABOLIC PANEL
ALT: 71 U/L — ABNORMAL HIGH (ref 0–44)
AST: 137 U/L — ABNORMAL HIGH (ref 15–41)
Albumin: 2.5 g/dL — ABNORMAL LOW (ref 3.5–5.0)
Alkaline Phosphatase: 96 U/L (ref 38–126)
Anion gap: 7 (ref 5–15)
BUN: 10 mg/dL (ref 6–20)
CO2: 28 mmol/L (ref 22–32)
Calcium: 8.3 mg/dL — ABNORMAL LOW (ref 8.9–10.3)
Chloride: 102 mmol/L (ref 98–111)
Creatinine, Ser: 0.89 mg/dL (ref 0.61–1.24)
GFR calc Af Amer: >60 mL/min (ref >60)
GFR calc non Af Amer: >60 mL/min (ref >60)
Glucose, Bld: 107 mg/dL — ABNORMAL HIGH (ref 70–99)
Potassium: 4 mmol/L (ref 3.5–5.1)
Sodium: 137 mmol/L (ref 135–145)
Total Bilirubin: 1.4 mg/dL — ABNORMAL HIGH (ref 0.3–1.2)
Total Protein: 5.2 g/dL — ABNORMAL LOW (ref 6.5–8.1)

## 2019-10-07 MED ORDER — ENSURE ENLIVE PO LIQD: 237.0000 mL | Freq: Two times a day (BID) | ORAL | 12 refills | Status: DC

## 2019-10-07 MED ORDER — NAPROXEN 250 MG PO TABS
500.0000 mg | ORAL_TABLET | Freq: Once | ORAL | Status: AC
  Administered 2019-10-07: 500 mg via ORAL
  Filled 2019-10-07: qty 2

## 2019-10-07 MED ORDER — ENSURE ENLIVE PO LIQD: 237.0000 mL | Freq: Three times a day (TID) | ORAL | Status: DC

## 2019-10-07 MED ORDER — ADULT MULTIVITAMIN W/MINERALS CH: 1.0000 | ORAL_TABLET | Freq: Every day | ORAL | 0 refills | Status: DC

## 2019-10-07 MED ORDER — THIAMINE HCL 100 MG PO TABS: 100.0000 mg | ORAL_TABLET | Freq: Every day | ORAL | 0 refills | Status: DC

## 2019-10-07 MED ORDER — NICOTINE 21 MG/24HR TD PT24: 21.0000 mg | MEDICATED_PATCH | Freq: Every day | TRANSDERMAL | 0 refills | Status: DC

## 2019-10-07 NOTE — Progress Notes (Signed)
Initial Nutrition Assessment  DOCUMENTATION CODES:   Not applicable  INTERVENTION:   Ensure Enlive po TID, each supplement provides 350 kcal and 20 grams of protein  Continue MVI  Educated on importance of adequate protein intake   NUTRITION DIAGNOSIS:   Inadequate oral intake related to poor appetite as evidenced by per patient/family report.    GOAL:   Patient will meet greater than or equal to 90% of their needs    MONITOR:   PO intake, Supplement acceptance, Weight trends, Labs  REASON FOR ASSESSMENT:   Consult Assessment of nutrition requirement/status  ASSESSMENT:   Pt with a PMH significant for HTN, gout, GERD, and schizoaffective disorder who presented for EtOH withdrawal.   Discussed pt with RN.   Pt reports periodically going a day at a time without eating due to feeling too full from drinking, but states a typical day of eating would consist of eggs for breakfast, a meat and cheese sandwich for lunch, and some type of protein and carbohydrate for dinner (such as spaghetti and meatballs). Pt denies recent wt loss. Discussed importance of not skipping meals and adequate protein consumption with pt. Also discussed methods for increasing his protein consumption at home, especially when eating is unappealing.   No PO intake documented. Pt states he is consuming 100% of his meals and supplements.   Medications reviewed and include: Ensure Enlive BID, Folvite, MVI, Thiamine  Labs reviewed.  UOP: 640ml x24 hours I/O: -447ml since admit  NUTRITION - FOCUSED PHYSICAL EXAM:    Most Recent Value  Orbital Region  No depletion  Upper Arm Region  No depletion  Thoracic and Lumbar Region  No depletion  Buccal Region  No depletion  Temple Region  No depletion  Clavicle Bone Region  No depletion  Clavicle and Acromion Bone Region  No depletion  Scapular Bone Region  No depletion  Dorsal Hand  No depletion  Patellar Region  No depletion  Anterior Thigh Region   No depletion  Posterior Calf Region  No depletion  Edema (RD Assessment)  Mild  Hair  Reviewed  Eyes  Reviewed  Mouth  Reviewed  Skin  Reviewed  Nails  Reviewed       Diet Order:   Diet Order            Diet Heart Room service appropriate? Yes; Fluid consistency: Thin  Diet effective now              EDUCATION NEEDS:   Education needs have been addressed  Skin:  Skin Assessment: Reviewed RN Assessment  Last BM:  10/05/19  Height:   Ht Readings from Last 1 Encounters:  10/05/19 6' (1.829 m)    Weight:   Wt Readings from Last 1 Encounters:  10/07/19 109.7 kg    BMI:  Body mass index is 32.8 kg/m.  Estimated Nutritional Needs:   Kcal:  2200-2400  Protein:  135-150 grams  Fluid:  >/=2L/d    Larkin Ina, MS, RD, LDN RD pager number and weekend/on-call pager number located in [place].

## 2019-10-07 NOTE — Discharge Summary (Signed)
Name: Sharman Fairbairn MRN: [id number] DOB: 05-Dec-1970 49 y.o. PCP: Scot Jun, FNP  Date of Admission: 10/05/2019 12:28 PM Date of Discharge:  10/07/2019 Attending Physician: Aldine Contes, MD  Discharge Diagnosis: 1. Alcohol withdrawal  Discharge Medications: Allergies as of 10/07/2019       Reactions   Wellbutrin [bupropion] Other (See Comments)   Caused seizures        Medication List     TAKE these medications    allopurinol 100 MG tablet Commonly known as: ZYLOPRIM Take 2 tablets (200 mg total) by mouth daily.   amLODipine 10 MG tablet Commonly known as: NORVASC Take 1 tablet (10 mg total) by mouth daily.   feeding supplement (ENSURE ENLIVE) Liqd Take 237 mLs by mouth 2 (two) times daily between meals.   loperamide 2 MG capsule Commonly known as: IMODIUM Take 4 mg by mouth as needed for diarrhea or loose stools.   metoprolol tartrate 25 MG tablet Commonly known as: LOPRESSOR Take 0.5 tablets (12.5 mg total) by mouth 2 (two) times daily.   Misc. Devices Misc Blood Pressure monitor. Dx :Hypertension   multivitamin with minerals Tabs tablet Take 1 tablet by mouth daily.   naproxen sodium 220 MG tablet Commonly known as: ALEVE Take 220-440 mg by mouth as needed (pain).   nicotine 21 mg/24hr patch Commonly known as: NICODERM CQ - dosed in mg/24 hours Place 1 patch (21 mg total) onto the skin daily.   pantoprazole 40 MG tablet Commonly known as: PROTONIX Take 1 tablet (40 mg total) by mouth daily.   thiamine 100 MG tablet Take 1 tablet (100 mg total) by mouth daily.        Disposition and follow-up:   Mr.Daron Berhow was discharged from [facility] in Stable condition.  At the hospital follow up visit please address:  1.   Alcohol Withdrawal  - Patient had macrocytic anemia and hypoalbuminemia on labs. I expect this is secondary to       malnutrition, please follow up if not resolved with alcohol cessation and  better nutrition.  - Check LFT's, trending down on admission. Previous hepatitis panel negative.  - Fatty infiltration seen on abdominal ultrasound, recommend following and discussing further workup if appropriate.    2.  Labs / imaging needed at time of follow-up: CMP  3.  Pending labs/ test needing follow-up:  Follow-up Appointments:    Hospital Course by problem list: 1.  #Alcohol use disorder  #alcohol withdrawal with history of withdrawal seizures Patient says he drinks to treat his mental illness. He does not like the medications he was prescribed in the past and stopped taking them.  - Admitted with tremors, tachycardia, and visibly anxious. Ativan given initially, but CIWA scores low after first day of admission. Abdominal ultrasound showed fatty infiltration, will recommend outpatient follow-up. Declined PT. TOC consulted to provide resources for alcohol cessation.   #Lower extremity edema Normal TTE, EF 60-65%, no valve abnormalities or wall motion abnormalities . LEE improving with compression stockings. I expect his edema is secondary to low oncotic pressure.Hypoalbunimea on labs. Likely secondary to malnutrition with chronic alcohol use disorder. Macrocytic anemia on labs also likely from poor nutrition. Will suggest further workup to PCP if not improved with nutrition.    Discharge Vitals:   BP 137/87   Pulse 90   Temp 98.3 F (36.8 C) (Oral)   Resp (!) 23   Ht 6' (1.829 m)   Wt 109.7 kg   SpO2  95%   BMI 32.80 kg/m   Pertinent Labs, Studies, and Procedures:  CBC Latest Ref Rng & Units 10/07/2019 10/06/2019 10/05/2019  WBC 4.0 - 10.5 K/uL 6.2 6.3 7.7  Hemoglobin 13.0 - 17.0 g/dL 10.5(L) 10.4(L) 11.8(L)  Hematocrit 39.0 - 52.0 % 32.3(L) 31.8(L) 36.5(L)  Platelets 150 - 400 K/uL 242 233 257   BMP Latest Ref Rng & Units 10/07/2019 10/06/2019 10/05/2019  Glucose 70 - 99 mg/dL 107(H) 97 109(H)  BUN 6 - 20 mg/dL 10 7 7   Creatinine 0.61 - 1.24 mg/dL 0.89 0.79 0.72    BUN/Creat Ratio 9 - 20 - - -  Sodium 135 - 145 mmol/L 137 138 138  Potassium 3.5 - 5.1 mmol/L 4.0 3.4(L) 2.9(L)  Chloride 98 - 111 mmol/L 102 97(L) 97(L)  CO2 22 - 32 mmol/L 28 29 28   Calcium 8.9 - 10.3 mg/dL 8.3(L) 7.9(L) 8.2(L)  CLINICAL DATA:  Shortness of breath  EXAM: CHEST - 2 VIEW  COMPARISON:  March 16, 2019  FINDINGS: Lungs are clear. Heart size and pulmonary vascularity are normal. No adenopathy. No bone lesions.  IMPRESSION: No abnormality noted.     CLINICAL DATA:  Transaminitis.  Obesity.  EXAM: ABDOMEN ULTRASOUND COMPLETE  COMPARISON:  None.  FINDINGS: Gallbladder: No gallstones or wall thickening visualized. No sonographic Murphy sign noted by sonographer.  Common bile duct: Diameter: 4.2 mm  Liver: Diffusely increased echogenicity liver without focal lesion. Portal vein is patent on color Doppler imaging with normal direction of blood flow towards the liver.  IVC: No abnormality visualized.  Pancreas: Visualized portion unremarkable.  Spleen: Size and appearance within normal limits.  Right Kidney: Length: 12.7 cm. Echogenicity within normal limits. No mass or hydronephrosis visualized.  Left Kidney: Length: 12.3 cm. Echogenicity within normal limits. No mass or hydronephrosis visualized.  Abdominal aorta: No aneurysm visualized.  Other findings: None.  IMPRESSION: Hyperechoic liver compatible with fatty infiltration. No focal liver lesion  Negative for gallstones.   Discharge Instructions:    Tamsen Snider, MD PGY1

## 2019-10-07 NOTE — TOC Initial Note (Signed)
Transition of Care [facility]) - Initial/Assessment Note    Patient Details  Name: [name] MRN: [id number] Date of Birth: [birth year]  Transition of [facility]) CM/SW Contact:    Ralph Dowdy, [place] Work Phone Number: 10/07/2019, 11:37 AM  Clinical Narrative:                 CSW and MSW saw pt for substance use consult. MSW and CSW asked if pt needed alcohol resources. Pt was hesitant, but gladly accepted resources. Pt thanked MSW and CSW for stopping by. No further requests at this time.         Patient Goals and CMS Choice        Expected Discharge Plan and Services           Expected Discharge Date: 10/07/19                                    Prior Living Arrangements/Services                       Activities of Daily Living Home Assistive Devices/Equipment: Eyeglasses ADL Screening (condition at time of admission) Patient's cognitive ability adequate to safely complete daily activities?: Yes Is the patient deaf or have difficulty hearing?: No Does the patient have difficulty seeing, even when wearing glasses/contacts?: No Does the patient have difficulty concentrating, remembering, or making decisions?: No Patient able to express need for assistance with ADLs?: Yes Does the patient have difficulty dressing or bathing?: No Independently performs ADLs?: Yes (appropriate for developmental age) Does the patient have difficulty walking or climbing stairs?: No Weakness of Legs: None Weakness of Arms/Hands: None  Permission Sought/Granted                  Emotional Assessment              Admission diagnosis:  Alcohol withdrawal ([place]) [F10.239] Peripheral edema [R60.9] Alcohol withdrawal syndrome without complication ([place]) [id number] Acute on chronic congestive heart failure, unspecified heart failure type ([place]) [I50.9] Patient Active Problem List   Diagnosis Date Noted  . Rhabdomyolysis 03/06/2019  . Alcoholic hepatitis  without ascites 03/06/2019  . Alcohol withdrawal ([place]) 01/15/2019  . HTN (hypertension) 01/10/2019  . Fall 01/10/2019  . Alcohol intoxication ([place]) 01/10/2019  . Schizoaffective disorder, bipolar type ([place]) 06/02/2018  . Alcohol dependence with uncomplicated withdrawal ([place]) 02/22/2016  . Alcohol-induced mood disorder ([place]) 02/22/2016  . Gout 10/22/2015  . GERD (gastroesophageal reflux disease) 10/22/2015  . Alcohol withdrawal seizure with complication ([place]) [id number]  . Schizoaffective disorder, depressive type ([place])   . History of alcohol abuse   . Tobacco dependence due to cigarettes 11/20/2014  . Alcohol abuse with intoxication ([place])   . Hyponatremia 10/18/2014  . Abnormal LFTs   . Alcohol abuse 02/09/2014  . Post traumatic stress disorder (PTSD) 11/03/2011  . Hypokalemia 07/02/2011   PCP:  Scot Jun, FNP Pharmacy:   [facility], [address] [place] [address] Phone: [phone] Fax: [phone]     Social Determinants of Health (SDOH) Interventions    Readmission Risk Interventions Readmission Risk Prevention Plan 01/15/2019  Medication Screening Complete  Transportation Screening Complete  Some recent data might be hidden

## 2019-10-07 NOTE — Evaluation (Signed)
Occupational Therapy Evaluation and Discharge Patient Details Name: [name] MRN: [id number] DOB: Mar 18, 1971 Today's Date: 10/07/2019    History of Present Illness 49 year old male with a past medical history of hypertension, gout, GERD and schizoaffective disorder, alcohol use disorder who presented to the ED with lower extremity swelling as well as alcohol withdrawal.   Clinical Impression   Pt is functioning independently. VSS with ambulation in room and with ADL. No further OT needs.    Follow Up Recommendations  No OT follow up    Equipment Recommendations  None recommended by OT    Recommendations for Other Services       Precautions / Restrictions Precautions Precautions: Other (comment)(seizure) Restrictions Weight Bearing Restrictions: No      Mobility Bed Mobility Overal bed mobility: Independent                Transfers Overall transfer level: Independent Equipment used: None                  Balance                                           ADL either performed or assessed with clinical judgement   ADL Overall ADL's : Independent                                             Vision Baseline Vision/History: Wears glasses Wears Glasses: At all times Patient Visual Report: No change from baseline       Perception     Praxis      Pertinent Vitals/Pain Pain Assessment: No/denies pain     Hand Dominance Right   Extremity/Trunk Assessment Upper Extremity Assessment Upper Extremity Assessment: Overall WFL for tasks assessed   Lower Extremity Assessment Lower Extremity Assessment: Overall WFL for tasks assessed   Cervical / Trunk Assessment Cervical / Trunk Assessment: Normal   Communication Communication Communication: No difficulties   Cognition Arousal/Alertness: Awake/alert Behavior During Therapy: Flat affect Overall Cognitive Status: Within Functional Limits for tasks  assessed                                     General Comments       Exercises     Shoulder Instructions      Home Living Family/patient expects to be discharged to:: Private residence Living Arrangements: Alone Available Help at Discharge: Family;Available 24 hours/day Type of Home: Apartment Home Access: Stairs to enter [employer] of Steps: flight Entrance Stairs-Rails: Left Home Layout: One level     Bathroom Shower/Tub: Occupational psychologist: Standard     Home Equipment: Environmental consultant - 2 wheels          Prior Functioning/Environment Level of Independence: Independent        Comments: likes to game, play music, on disability        OT Problem List:        OT Treatment/Interventions:      OT Goals(Current goals can be found in the care plan section) Acute Rehab OT Goals Patient Stated Goal: go home  OT Frequency:     Barriers to D/C:  Co-evaluation              AM-PAC OT "6 Clicks" Daily Activity     Outcome Measure Help from another person eating meals?: None Help from another person taking care of personal grooming?: None Help from another person toileting, which includes using toliet, bedpan, or urinal?: None Help from another person bathing (including washing, rinsing, drying)?: None Help from another person to put on and taking off regular upper body clothing?: None Help from another person to put on and taking off regular lower body clothing?: None 6 Click Score: 24   End of Session    Activity Tolerance: Patient tolerated treatment well Patient left: in bed;with nursing/sitter in room  OT Visit Diagnosis: Muscle weakness (generalized) (M62.81)                Time: [id number] OT Time Calculation (min): 15 min Charges:  OT General Charges $OT Visit: 1 Visit OT Evaluation $OT Eval Low Complexity: 1 Low  [name], OTR/L Acute Rehabilitation Services Pager: [phone] Office:  [phone]  Malka So 10/07/2019, 10:12 AM

## 2019-10-07 NOTE — Discharge Instructions (Addendum)
Mr.Tufaro  It was our pleasure to provide care. Please follow up with your primary care doctor in 1-2 weeks. Discuss ways to sustain from alcohol use and they will want to monitor your liver function. Wishing you the best.   ____________________________________________________________________________________ [facility] Stay Proper nutrition can help your body recover from illness and injury.   Foods and beverages high in protein, vitamins, and minerals help rebuild muscle loss, promote healing, & reduce fall risk.   .In addition to eating healthy foods, a nutrition shake is an easy, delicious way to get the nutrition you need during and after your hospital stay  It is recommended that you continue to drink 2 bottles per day of:       Ensure Enlive  for at least 1 month (30 days) after your hospital stay   Tips for adding a nutrition shake into your routine: As allowed, drink one with vitamins or medications instead of water or juice Enjoy one as a tasty mid-morning or afternoon snack Drink cold or make a milkshake out of it Drink one instead of milk with cereal or snacks Use as a coffee creamer   Available at the following grocery stores and pharmacies:           * [place] [phone]            For COUPONS visit: www.ensure.com/join or [email or web address]-[email or web address]   Suggested Substitutions Ensure Plus = Boost Plus = Carnation Breakfast Essentials = Boost Compact Ensure Active Clear = Boost Breeze Glucerna Shake = Boost Glucose Control = Carnation Breakfast Essentials SUGAR FREE

## 2019-10-07 NOTE — Progress Notes (Signed)
   Subjective:  Patient reports he feels overall worse this morning. He started having some aches, pains, and headaches. He reports that the compression stocks have been hurting. He said he is anxious. He feels some of his symptoms might be in his head. Discussed results from the echocardiogram. Discussed  his workup and clinical finding seem all likely related to his alcohol use. He reports he normally takes aleve at home for his headache. We will put some in for him. His last drink was on Sunday and his CIWA scores are mostly 0. He has a PCP. Discussed having social work come by to provide extra information on alcohol cessation.We will check on him later and if he is feeling better he can be discharged later today.  He is agreeable All questions and concerns addressed.   Objective:  Vital signs in last 24 hours: Vitals:   10/06/19 2200 10/07/19 0000 10/07/19 0400 10/07/19 0600  BP: (!) 129/96 126/88 137/87   Pulse:      Resp: (!) 31 (!) 28    Temp:      TempSrc:      SpO2:      Weight:    109.7 kg  Height:       Gen: Alert, NAD CV: RRR,  no murmurs rubs or gallops Pulmonary: Breath sounds normal no wheezes, rales, or rhonchi Abdomen: Soft ,nontender, active bowel sounds Ext: mild tremors in hands, but improved ,lower extremity edema improving with compression stockings   CBC Latest Ref Rng & Units 10/07/2019 10/06/2019 10/05/2019  WBC 4.0 - 10.5 K/uL 6.2 6.3 7.7  Hemoglobin 13.0 - 17.0 g/dL 10.5(L) 10.4(L) 11.8(L)  Hematocrit 39.0 - 52.0 % 32.3(L) 31.8(L) 36.5(L)  Platelets 150 - 400 K/uL 242 233 257   BMP Latest Ref Rng & Units 10/07/2019 10/06/2019 10/05/2019  Glucose 70 - 99 mg/dL 107(H) 97 109(H)  BUN 6 - 20 mg/dL 10 7 7   Creatinine 0.61 - 1.24 mg/dL 0.89 0.79 0.72  BUN/Creat Ratio 9 - 20 - - -  Sodium 135 - 145 mmol/L 137 138 138  Potassium 3.5 - 5.1 mmol/L 4.0 3.4(L) 2.9(L)  Chloride 98 - 111 mmol/L 102 97(L) 97(L)  CO2 22 - 32 mmol/L 28 29 28   Calcium 8.9 - 10.3 mg/dL 8.3(L)  7.9(L) 8.2(L)    Assessment/Plan:  Active Problems:   Alcohol withdrawal (HCC)  Antwoine Mcguffie is a 49 year old person with past medical history of HTN, gout, GERD, and schizo affective disorder who is here for alcohol withdrawal.    #Alcohol use disorder  #alcohol withdrawal with history of withdrawal seizures LFTs continue to trend down. CIWA scores of 0 and 2.  Abdominal ultrasound showed fatty infiltration, will recommend outpatient follow-up. Declined PT.  P: -CIWA , no ativan - Monitor vitals  - TOC consult for alcohol cessation resources   #Lower extremity edema Normal TTE, EF 60-65%, no valve abnormalities or wall motion abnormalities . LEE improving with compression stockings. I expect his edema is secondary to low oncotic pressure.Hypoalbunimea on labs. Likely secondary to malnutrition with chronic alcohol use disorder. Macrocytic anemia on labs also likely from poor nutrition. Will suggest further workup to PCP if not improved with nutrition.  P: - continue ensure, follow up RD recommendations - compression stockings  - no further work up  Prior to Admission Living Arrangement:home Anticipated Discharge Location:home Barriers to Discharge:continue medical treatment Dispo: Anticipated discharge in approximately today.  Tamsen Snider, MD PGY1

## 2019-10-07 NOTE — Plan of Care (Signed)
  Problem: Education: Goal: Knowledge of disease or condition will improve Outcome: Adequate for Discharge Goal: Understanding of discharge needs will improve Outcome: Adequate for Discharge   Problem: Health Behavior/Discharge Planning: Goal: Ability to identify changes in lifestyle to reduce recurrence of condition will improve Outcome: Adequate for Discharge Goal: Identification of resources available to assist in meeting health care needs will improve Outcome: Adequate for Discharge   Problem: Physical Regulation: Goal: Complications related to the disease process, condition or treatment will be avoided or minimized Outcome: Adequate for Discharge   Problem: Safety: Goal: Ability to remain free from injury will improve Outcome: Adequate for Discharge   Problem: Education: Goal: Knowledge of General Education information will improve Description: Including pain rating scale, medication(s)/side effects and non-pharmacologic comfort measures Outcome: Adequate for Discharge   Problem: Health Behavior/Discharge Planning: Goal: Ability to manage health-related needs will improve Outcome: Adequate for Discharge   Problem: Clinical Measurements: Goal: Ability to maintain clinical measurements within normal limits will improve Outcome: Adequate for Discharge Goal: Will remain free from infection Outcome: Adequate for Discharge Goal: Diagnostic test results will improve Outcome: Adequate for Discharge Goal: Respiratory complications will improve Outcome: Adequate for Discharge Goal: Cardiovascular complication will be avoided Outcome: Adequate for Discharge   Problem: Activity: Goal: Risk for activity intolerance will decrease Outcome: Adequate for Discharge   Problem: Nutrition: Goal: Adequate nutrition will be maintained Outcome: Adequate for Discharge   Problem: Coping: Goal: Level of anxiety will decrease Outcome: Adequate for Discharge   Problem:  Elimination: Goal: Will not experience complications related to bowel motility Outcome: Adequate for Discharge Goal: Will not experience complications related to urinary retention Outcome: Adequate for Discharge   Problem: Pain Managment: Goal: General experience of comfort will improve Outcome: Adequate for Discharge   Problem: Safety: Goal: Ability to remain free from injury will improve Outcome: Adequate for Discharge   Problem: Skin Integrity: Goal: Risk for impaired skin integrity will decrease Outcome: Adequate for Discharge   

## 2019-10-10 ENCOUNTER — Telehealth: Payer: Self-pay

## 2019-10-10 NOTE — Telephone Encounter (Signed)
Transition Care Management Follow-up Telephone Call Date of discharge and from where: 10/07/2019, [place] placed to # [phone] [facility] Phone) and # [phone].  Messages left at each number requesting a call back to this CM .  Patient needs Hospital follow up appointment

## 2019-10-11 ENCOUNTER — Encounter: Payer: Self-pay | Admitting: *Deleted

## 2019-10-11 ENCOUNTER — Other Ambulatory Visit: Payer: Self-pay | Admitting: *Deleted

## 2019-10-11 ENCOUNTER — Telehealth: Payer: Self-pay

## 2019-10-11 NOTE — Patient Outreach (Signed)
[place] [facility]) Care Management [facility] CM Telephone Outreach, [place] Red Alert notification PCP office completes Transition of Care follow up post-hospital discharge Post-hospital discharge day # 4  10/11/2019  [name] 1971-08-09 [id number]  EMMI red Alert notification/ General discharge EMMI call date/ day # : Sunday 10/09/19; day # 1 Red-Alert reason(s): "no transportation to follow up appointments"  Unsuccessful initial telephone outreach to [name], 49 y/o male referred to [facility] CM by [facility] CMA on 10/10/19 after EMMI Red Alert notification received; patient was recently hospitalized February 17-19, 2021 for acute alcohol intoxication with anasarca; patient was discharged home to self-care without home health services.  Patient has history including, but not limited to, PTSD with schizoaffective disorder and depression; ETOH abuse and dependency; GERD; ongoing tobacco use, and HTN. Noted through review of EMR that patient is followed regularly by established [facility] provider.  HIPAA compliant voice mail message left for patient, requesting return call back.  Plan:  Will place [facility] Community CM unsuccessful patient outreach letter in mail requesting call back in writing  Will re-attempt [place] telephone outreach within 4 business days if I do not hear back from patient first.  Oneta Rack, RN, BSN, [employer] Coordinator [facility] Care Management  [phone]

## 2019-10-11 NOTE — Telephone Encounter (Signed)
Transition Care Management Follow-up Telephone Call  Date of discharge and from where: 10/07/2019, [facility]   How have you been since you were released from the hospital? He stated that he is doing okay, his legs are still swollen but improved from when he was in the hospital.    Any questions or concerns? Only concern was regarding leg edema noted above.   Items Reviewed:  Did the pt receive and understand the discharge instructions provided? He said that he has the instructions but has not reviewed them and did not have them with him at the time of this call.   Medications obtained and verified? He said that he has all of his old medications but has not picked up anything new. He also noted that he continues to smoke.  No questions about medications at this time.   Any new allergies since your discharge? None reported   Do you have support at home? Lives alone  Other (ie: DME, Home Health, etc) no home health ordered. No DME  Functional Questionnaire: (I = Independent and D = Dependent) ADL's: **independent  Follow up appointments reviewed:    PCP Hospital f/u appt confirmed? Scheduled appointment with Dr Juleen China - 11/07/2019 @ [facility] f/u appt confirmed? None scheduled at this time  Are transportation arrangements needed?no, he said that his sister drives   If their condition worsens, is the pt aware to call  their PCP or go to the ED?   yes  Was the patient provided with contact information for the PCP's office or ED?   He has the phone number for the clinic   Was the pt encouraged to call back with questions or concerns? yes

## 2019-10-13 ENCOUNTER — Encounter: Payer: Self-pay | Admitting: *Deleted

## 2019-10-13 ENCOUNTER — Other Ambulatory Visit: Payer: Self-pay | Admitting: *Deleted

## 2019-10-13 NOTE — Patient Outreach (Signed)
[facility]) Care Management [facility] CM Telephone Outreach, [facility] Red Alert Notification/ General Discharge PCP office completes Transition of care follow up post-hospital discharge Post-hospital discharge day # 6  10/13/2019  Rudie Serven 18-Jun-1971 [id number]  EMMI red Alert notification/ General discharge EMMI call date/ day # : Sunday 10/09/19; day # 1 Red-Alert reason(s): "no transportation to follow up appointments"  EMMI call date/ day #: Wednesday 10/13/19; day # 4 Red-Alert Reason(s): sad/ depressed/ hopeless/ lost interest  Unsuccessful consecutive second attempt telephone outreach to [name], 50 y/o male referred to [place] by [facility] CMA on 10/10/19 after EMMI Red Alert notification received; patient was recently hospitalized February 17-19, 2021 for acute alcohol intoxication with anasarca; patient was discharged home to self-care without home health services.  Patient has history including, but not limited to, PTSD with schizoaffective disorder and depression; ETOH abuse and dependency; GERD; ongoing tobacco use, and HTN. Noted through review of EMR that patient is followed regularly by established [facility] provider.  Received second EMMI red Alert notification this morning, as above.  HIPAA compliant voice mail message left for patient, requesting return call back.  Plan:  Verified [place] unsuccessful patient outreach letter in mail requesting call back in writing on October 11, 2019  Will re-attempt [place] telephone outreach within 4 business days if I do not hear back from patient first.  Oneta Rack, RN, BSN, [place] Coordinator [facility] Care Management  [phone]

## 2019-10-17 ENCOUNTER — Other Ambulatory Visit: Payer: Self-pay | Admitting: *Deleted

## 2019-10-17 ENCOUNTER — Encounter: Payer: Self-pay | Admitting: *Deleted

## 2019-10-17 NOTE — Patient Outreach (Signed)
[place] [facility]) [place] Telephone Outreach PCP office completes Transition of Care follow up post- hospital discharge Post-hospital discharge day # 10  10/17/2019  Dreshun Lowy [birth year] [id number]  EMMI red Alert notification/ General discharge EMMI call date/ day # :Sunday 10/09/19; day # 1 Red-Alert reason(s):"no transportation to follow up appointments"  EMMI call date/ day #: Wednesday 10/13/19; day # 4 Red-Alert Reason(s): sad/ depressed/ hopeless/ lost interest  Unsuccessful consecutive third attempt telephone outreach to [name], 49 y/o male referred to [place] by [facility] CMA on 10/10/19 after EMMI Red Alert notification received; patient was recently hospitalized February 17-19, 2021 for acute alcohol intoxication with anasarca; patient was discharged home to self-care without home health services.Patient has history including, but not limited to, PTSD with schizoaffective disorder and depression; ETOH abuse and dependency; GERD; ongoing tobacco use, and HTN.Noted through review of EMR that patient is followed regularly by established [facility] provider.  Received second EMMI red Alert notification 10/13/19, as above.  HIPAA compliant voice mail message left for patient, requesting return call back.  Plan:  Verified [place] unsuccessful patient outreach letter in mail requesting call back in writing on October 11, 2019  Will move to [place] case closure if do not hear back from patient within 10 business days of initial outreach (10/11/19)  Oneta Rack, RN, BSN, [place] Care Management  [phone]

## 2019-10-18 ENCOUNTER — Other Ambulatory Visit: Payer: Self-pay | Admitting: *Deleted

## 2019-10-18 ENCOUNTER — Encounter: Payer: Self-pay | Admitting: *Deleted

## 2019-10-18 NOTE — Patient Outreach (Addendum)
[facility]) [place] Telephone Outreach PCP office completes Transition of Care follow up post-hospital discharge Post- hospital discharge day # 11  10/18/2019  [name] Mar 30, 1971 [id number]  EMMI red Alert notification/ General discharge EMMI call date/ day # :Sunday 10/09/19; day # 1 Red-Alert reason(s):"no transportation to follow up appointments"  EMMI call date/ day #: Wednesday 10/13/19; day # 4 Red-Alert Reason(s): sad/ depressed/ hopeless/ lost interest  Successfultelephone outreach to [name], 48 y/o male referred to [facility] CM by [facility] CMA on 10/10/19 after EMMI Red Alert notification received; patient was recently hospitalized February 17-19, 2021 for acute alcohol intoxication with anasarca; patient was discharged home to self-care without home health services.Patient has history including, but not limited to, PTSD with schizoaffective disorder and depression; ETOH abuse and dependency; GERD; ongoing tobacco use, and HTN.Noted through review of EMR that patient is followed regularly by established [facility] provider.  Patient returned my call this afternoon in follow up to voice mail messages left for him; HIPAA/ identity verified, purpose of call and [facility] CM services discussed with patient.  Patient reports today "doing some better" after his recent hospitalization; reports that he has continued to experience lower extremity swelling, which he reports "is getting some better," since hospital discharge now that he is wearing compression stockings. Patient sounds to be in no distress throughout entirety of phone call today.  Patient admits to ongoing daily use of ETOH post-hospital discharge; states that he binge drinks, but typically drinks every day "until passes out;" states that he used to be established with a psychiatric provider, however stopped going to provider due to the "medications they wanted me to take makes me feel out of  sorts in my head."  Depression screening completed, and patient reports that he is at baseline around depression- denies suicidal ideation; states that he "can't go into" all the reasons he is depressed and admits that this is a ongoing issue; states that he "would consider" returning to psychiatric provider however, he "is not sure," based on the medications that were previously prescribed to him that make him feel "out of (his) head."  States that he "might" consider stopping drinking, but "is not sure," stating that the "alcohol is the only thing that makes me feel right in my head."   He declines need for ongoing [facility] RN CM outreach stating no care coordination/ management needs-- however, patient is agreeable to my placing a [facility] CSW referral to follow up on his occasional transportation issues and tentative willingness to consider resources available around ETOH cessation/ transportation/ re-establishment with mental health provider.  Confirms that he has not had follow up with mental health provider for his ongoing depression/ schizoaffective disorder "in over a year."  Patient was encouraged to discuss all of this with his PCP at time of upcoming scheduled provider office visit or to contact them sooner if necessary; patient does not think he needs to contact provider sooner, stating his current state of health at baseline.  We reviewed post-hospital discharge instructions during phone call today.  Patient further reports:  Medications: -- Has all medicationsand takes as prescribed;denies questions about current medications.  -- Verbalizes good general understanding of the purpose, dosing, and scheduling of medications.   -- self-manage medications- does not require assistance -- denies issues with swallowing medications -- patient was recently discharged from the hospital and all medications were reviewed with patient today  Provider appointments: -- All upcoming provider appointments were  reviewed with  patient today; patient reports that he has scheduled follow up appointment with PCP later this month; reports sister will provide transportation   [employer] needs: -- currently reports community resource needs for management of chronic mental health conditions, including ongoing ETOH use/ occasional transportation needs/ mental health resources to become re-established with psychiatric provider- will place [facility] CSW referral -- reports supportive family member (sister) who "checks in regularly" and assists with transportation as available; patient reports also has [employer] and we discussed the transportation benefits available through insurance provider; patient confirms that he has not yet used his insurance transportation benefit -- SDOH completed for: depression/ transportation/ food insecurity  Patient denies further issues, concerns, or problems today.  Encouraged patient to engage with [facility] CSW and with his PCP to address his stated ongoing chronic mental health conditions and possible desire to stop drinking alcohol; patient agreed to do so.   Plan:  Will place referral for [facility] CSW team to contact patient around stated community resource needs as noted above  Will make patient's PCP aware of today's successful outreach with patient   Oneta Rack, RN, BSN, [place] Coordinator [facility] Care Management  [phone]

## 2019-10-26 ENCOUNTER — Other Ambulatory Visit: Payer: Self-pay | Admitting: *Deleted

## 2019-10-26 ENCOUNTER — Encounter: Payer: Self-pay | Admitting: *Deleted

## 2019-10-26 NOTE — Patient Outreach (Signed)
[place] [facility]) Care Management  10/26/2019  [name] 1971/03/26 [id number]   CSW made an initial attempt to try and contact patient today to perform the initial phone assessment, as well as assess and assist with social work needs and services, without success.  A HIPAA compliant message was left for patient on voicemail.  CSW is currently awaiting a return call.  CSW will make a second outreach attempt within the next 3-4 business days, if a return call is not received from patient in the meantime.  CSW will also mail an Outreach Letter to patient's home requesting that patient contact CSW if patient is interested in receiving social work services through [place] with Scientist, clinical (histocompatibility and immunogenetics).  Nat Christen, BSW, MSW, LCSW  Licensed Education officer, environmental Health System  Mailing [place] [address] [place], [place] [address] Physical Address-[address], [place], [place] [address] Toll Free Main # [phone] Fax # [phone] Cell # [phone]  Office # [phone] [email or web address]

## 2019-10-27 ENCOUNTER — Ambulatory Visit: Payer: Self-pay | Admitting: *Deleted

## 2019-11-01 ENCOUNTER — Other Ambulatory Visit: Payer: Self-pay | Admitting: *Deleted

## 2019-11-01 NOTE — Patient Outreach (Signed)
[place] [facility]) Care Management  11/01/2019  [name] 1971/06/24 [id number]   CSW made a second attempt to try and contact patient today to perform phone assessment, as well as assess and assist with social work needs and services, without success.  A HIPAA compliant message was left for patient on voicemail.  CSW continues to await a return call.  CSW will make a third and final outreach attempt within the next 3-4 business days, if a return call is not received from patient in the meantime.  CSW will then proceed with case closure if a return call is not received from patient with a total of 10 business days, as required number of phone attempts will have been made and outreach letter mailed.   Nat Christen, BSW, MSW, LCSW  Licensed Education officer, environmental Health System  Mailing [place] [address], [place], [place] [address] Physical Address[address], [place], [place] [address] Toll Free Main # [phone] Fax # [phone] Cell # [phone]  Office # [phone] [email or web address]

## 2019-11-04 ENCOUNTER — Other Ambulatory Visit: Payer: Self-pay | Admitting: *Deleted

## 2019-11-04 ENCOUNTER — Ambulatory Visit: Payer: Self-pay | Admitting: *Deleted

## 2019-11-04 NOTE — Patient Outreach (Signed)
[name]) Care Management  11/04/2019  [name] February 12, 1971 [id number]    CSW made a third and final attempt to try and contact patient today to perform the initial phone assessment, as well as assess and assist with social work needs and services, without success.  A HIPAA compliant message was left for patient on voicemail.  CSW is currently awaiting a return call.  CSW will proceed with case closure in three business days, on Wednesday, November 09, 2019, if a return call is not received from patient in the meantime, as required number of phone attempts have been made and an outreach letter was mailed to patient's home allowing 10 business days for a response if patient was interested in receiving social work services and resources through [place] with Scientist, clinical (histocompatibility and immunogenetics).  Nat Christen, BSW, MSW, LCSW  Licensed Education officer, environmental Health System  Mailing [place] [address], [place], [place] [address] Physical Address-[address], [place], [place] [address] Toll Free Main # [phone] Fax # [phone] Cell # [phone]  Office # [phone] [email or web address]

## 2019-11-07 ENCOUNTER — Encounter: Payer: Self-pay | Admitting: Gastroenterology

## 2019-11-07 ENCOUNTER — Encounter: Payer: Self-pay | Admitting: Internal Medicine

## 2019-11-07 ENCOUNTER — Ambulatory Visit (INDEPENDENT_AMBULATORY_CARE_PROVIDER_SITE_OTHER): Payer: Medicare Other | Admitting: Internal Medicine

## 2019-11-07 DIAGNOSIS — R7401 Elevation of levels of liver transaminase levels: Secondary | ICD-10-CM

## 2019-11-07 DIAGNOSIS — Z7289 Other problems related to lifestyle: Secondary | ICD-10-CM | POA: Diagnosis not present

## 2019-11-07 DIAGNOSIS — K760 Fatty (change of) liver, not elsewhere classified: Secondary | ICD-10-CM

## 2019-11-07 DIAGNOSIS — F1090 Alcohol use, unspecified, uncomplicated: Secondary | ICD-10-CM

## 2019-11-07 DIAGNOSIS — Z789 Other specified health status: Secondary | ICD-10-CM

## 2019-11-07 NOTE — Progress Notes (Signed)
Virtual Visit via Telephone Note  I connected with [name], on 11/07/2019 at 2:12 PM by telephone due to the COVID-19 pandemic and verified that I am speaking with the correct person using two identifiers.   Consent: I discussed the limitations, risks, security and privacy concerns of performing an evaluation and management service by telephone and the availability of in person appointments. I also discussed with the patient that there may be a patient responsible charge related to this service. The patient expressed understanding and agreed to proceed.   Location of Patient: Home   Location of Provider: Clinic    Persons participating in Telemedicine visit: Georgy Staple [facility] Dr. Juleen China      History of Present Illness: Patient has a hospital follow up visit for hospitalization from 2/17-2/19. Reports he quit drinking the day before yesterday.   Disposition and follow-up:   [name] was discharged from [facility] in Stable condition.  At the hospital follow up visit please address:  1.   Alcohol Withdrawal  - Patient had macrocytic anemia and hypoalbuminemia on labs. I expect this is secondary to malnutrition, please follow up if not resolved with alcohol cessation and better nutrition.  - Check LFT's, trending down on admission. Previous hepatitis panel negative.  - Fatty infiltration seen on abdominal ultrasound, recommend following and discussing further workup if appropriate.    2.  Labs / imaging needed at time of follow-up: CMP  3.  Pending labs/ test needing follow-up:  Follow-up Appointments:  Hospital Course by problem list:  #Alcohol use disorder  #alcohol withdrawal with history of withdrawal seizures Patient says he drinks to treat his mental illness. He does not like the medications he was prescribed in the past and stopped taking them.  - Admitted with tremors, tachycardia, and visibly anxious.  Ativan given initially, but CIWA scores low after first day of admission.Abdominal ultrasound showed fatty infiltration, will recommend outpatient follow-up. Declined PT.TOC consulted to provide resources for alcohol cessation.   #Lower extremity edema Normal TTE, EF 60-65%, no valve abnormalities or wall motion abnormalities. LEE improving with compression stockings.I expect his edema is secondary to low oncotic pressure.Hypoalbunimea on labs. Likely secondary to malnutrition with chronic alcohol use disorder.Macrocytic anemia on labsalso likely from poor nutrition. Will suggest further workup to PCP if not improved with nutrition.     Past Medical History:  Diagnosis Date  . Alcohol abuse   . Anxiety    at age 59  . Depression    at age 36  . Psychosis ([place])   . PTSD (post-traumatic stress disorder)   . Schizoaffective disorder   . Seizure disorder ([place])    related to etoh seizure   Allergies  Allergen Reactions  . Wellbutrin [Bupropion] Other (See Comments)    Caused seizures    Current Outpatient Medications on File Prior to Visit  Medication Sig Dispense Refill  . allopurinol (ZYLOPRIM) 100 MG tablet Take 2 tablets (200 mg total) by mouth daily. 180 tablet 1  . amLODipine (NORVASC) 10 MG tablet Take 1 tablet (10 mg total) by mouth daily. 90 tablet 1  . feeding supplement, ENSURE ENLIVE, (ENSURE ENLIVE) LIQD Take 237 mLs by mouth 2 (two) times daily between meals. (Patient not taking: Reported on 10/18/2019) 237 mL 12  . loperamide (IMODIUM) 2 MG capsule Take 4 mg by mouth as needed for diarrhea or loose stools.    . metoprolol tartrate (LOPRESSOR) 25 MG tablet Take 0.5 tablets (12.5 mg total) by  mouth 2 (two) times daily. 90 tablet 1  . Misc. Devices MISC Blood Pressure monitor. Dx :Hypertension 1 each 0  . Multiple Vitamin (MULTIVITAMIN WITH MINERALS) TABS tablet Take 1 tablet by mouth daily. (Patient not taking: Reported on 10/18/2019) 30 tablet 0  . naproxen sodium  (ALEVE) 220 MG tablet Take 220-440 mg by mouth as needed (pain).    . nicotine (NICODERM CQ - DOSED IN MG/24 HOURS) 21 mg/24hr patch Place 1 patch (21 mg total) onto the skin daily. (Patient not taking: Reported on 10/18/2019) 28 patch 0  . pantoprazole (PROTONIX) 40 MG tablet Take 1 tablet (40 mg total) by mouth daily. 90 tablet 1  . thiamine 100 MG tablet Take 1 tablet (100 mg total) by mouth daily. (Patient not taking: Reported on 10/18/2019) 30 tablet 0   No current facility-administered medications on file prior to visit.    Observations/Objective: NAD. Speaking clearly.  Work of breathing normal.  Alert and oriented. Mood appropriate.   Assessment and Plan: 1. Fatty liver 2. Transaminitis Patient symptomatic with abdominal bloating and LE edema. Suspect related to chronic alcohol use and resulting liver injury and malnutrition. Discussed this with patient and would recommend GI consultation. Would like to see if patient would be candidate for therapy such as Spironolactone/Lasix.  - Ambulatory referral to Gastroenterology - Comprehensive metabolic panel  3. Alcohol use Patient recently quit drinking again. Congratulated patient and discussed importance of adherence to no alcohol intake for improvement of his symptoms and co-morbidities related to long term EtOH use. Not currently experiencing any withdrawal symptoms but discussed he is at high risk of alcohol withdrawal seizure and if he is to start experiencing symptoms would recommend going to ER.    Follow Up Instructions: Lab visit on 3/24    I discussed the assessment and treatment plan with the patient. The patient was provided an opportunity to ask questions and all were answered. The patient agreed with the plan and demonstrated an understanding of the instructions.   The patient was advised to call back or seek an in-person evaluation if the symptoms worsen or if the condition fails to improve as anticipated.     I  provided 14 minutes total of non-face-to-face time during this encounter including median intraservice time, reviewing previous notes, investigations, ordering medications, medical decision making, coordinating care and patient verbalized understanding at the end of the visit.    Phill Myron, D.O. Primary Care at [facility]  11/07/2019, 2:12 PM

## 2019-11-09 ENCOUNTER — Encounter: Payer: Self-pay | Admitting: *Deleted

## 2019-11-09 ENCOUNTER — Other Ambulatory Visit: Payer: Self-pay

## 2019-11-09 ENCOUNTER — Other Ambulatory Visit: Payer: Self-pay | Admitting: *Deleted

## 2019-11-09 ENCOUNTER — Other Ambulatory Visit (INDEPENDENT_AMBULATORY_CARE_PROVIDER_SITE_OTHER): Payer: Medicare Other

## 2019-11-09 DIAGNOSIS — R7401 Elevation of levels of liver transaminase levels: Secondary | ICD-10-CM | POA: Diagnosis not present

## 2019-11-09 NOTE — Progress Notes (Signed)
Patient here for CMP ordered during recent phone visit.

## 2019-11-09 NOTE — Patient Outreach (Signed)
[facility]) Care Management  11/09/2019  [name] [birth year] [id number]   CSW will perform a case closure on patient, due to inability to establish initial phone contact, despite required number of phone attempts made and outreach letter mailed to patient's home, allowing 10 business days for a response if patient was interested in receiving social work services and resources through [place] with Scientist, clinical (histocompatibility and immunogenetics).  CSW will notify patient's RNCM, and individual placing the initial referral, also with [place] Management, Reginia Naas of CSW's plans to close patient's case.  CSW will fax an update to patient's Primary Care Physician, Dr. Phill Myron to ensure that they are aware of CSW's attempts at involvement with patient's plan of care.   Nat Christen, BSW, MSW, LCSW  Licensed Education officer, [facility]  Mailing [place] [address] [place], [place] [address] Physical Address-[address], [place], [place] [address] Toll Free Main # [phone] Fax # [phone] Cell # [phone]  Office # [phone] [email or web address]

## 2019-11-10 LAB — COMPREHENSIVE METABOLIC PANEL
ALT: 121 IU/L — ABNORMAL HIGH (ref 0–44)
AST: 344 IU/L — ABNORMAL HIGH (ref 0–40)
Albumin/Globulin Ratio: 1.1 — ABNORMAL LOW (ref 1.2–2.2)
Albumin: 3.2 g/dL — ABNORMAL LOW (ref 4.0–5.0)
Alkaline Phosphatase: 284 IU/L — ABNORMAL HIGH (ref 39–117)
BUN/Creatinine Ratio: 8 — ABNORMAL LOW (ref 9–20)
BUN: 6 mg/dL (ref 6–24)
Bilirubin Total: 6.6 mg/dL — ABNORMAL HIGH (ref 0.0–1.2)
CO2: 26 mmol/L (ref 20–29)
Calcium: 8.6 mg/dL — ABNORMAL LOW (ref 8.7–10.2)
Chloride: 87 mmol/L — ABNORMAL LOW (ref 96–106)
Creatinine, Ser: 0.74 mg/dL — ABNORMAL LOW (ref 0.76–1.27)
GFR calc Af Amer: 127 mL/min/{1.73_m2} (ref >59)
GFR calc non Af Amer: 110 mL/min/{1.73_m2} (ref >59)
Globulin, Total: 2.9 g/dL (ref 1.5–4.5)
Glucose: 118 mg/dL — ABNORMAL HIGH (ref 65–99)
Potassium: 3.4 mmol/L — ABNORMAL LOW (ref 3.5–5.2)
Sodium: 139 mmol/L (ref 134–144)
Total Protein: 6.1 g/dL (ref 6.0–8.5)

## 2019-11-14 ENCOUNTER — Ambulatory Visit: Payer: Medicare Other | Admitting: Gastroenterology

## 2019-11-17 ENCOUNTER — Ambulatory Visit: Payer: Medicare Other | Admitting: Gastroenterology

## 2019-11-22 NOTE — Progress Notes (Signed)
Patient notified of results & recommendations. Expressed understanding.  He states that he is having c/o vomiting & RUQ pain(7/10). He states that he will go to [facility] for evaluation.

## 2019-11-23 ENCOUNTER — Encounter (HOSPITAL_COMMUNITY): Payer: Self-pay | Admitting: Emergency Medicine

## 2019-11-23 ENCOUNTER — Emergency Department (HOSPITAL_COMMUNITY)
Admission: EM | Admit: 2019-11-23 | Discharge: 2019-11-23 | Disposition: A | Discharge status: Home / Self Care | Payer: Medicare Other | Attending: Emergency Medicine | Admitting: Emergency Medicine

## 2019-11-23 ENCOUNTER — Other Ambulatory Visit: Payer: Self-pay

## 2019-11-23 DIAGNOSIS — F4310 Post-traumatic stress disorder, unspecified: Secondary | ICD-10-CM | POA: Insufficient documentation

## 2019-11-23 DIAGNOSIS — F1290 Cannabis use, unspecified, uncomplicated: Secondary | ICD-10-CM | POA: Diagnosis not present

## 2019-11-23 DIAGNOSIS — R600 Localized edema: Secondary | ICD-10-CM | POA: Diagnosis not present

## 2019-11-23 DIAGNOSIS — F17210 Nicotine dependence, cigarettes, uncomplicated: Secondary | ICD-10-CM | POA: Diagnosis not present

## 2019-11-23 DIAGNOSIS — F1010 Alcohol abuse, uncomplicated: Secondary | ICD-10-CM

## 2019-11-23 DIAGNOSIS — K709 Alcoholic liver disease, unspecified: Secondary | ICD-10-CM | POA: Insufficient documentation

## 2019-11-23 DIAGNOSIS — F10120 Alcohol abuse with intoxication, uncomplicated: Secondary | ICD-10-CM | POA: Insufficient documentation

## 2019-11-23 DIAGNOSIS — I10 Essential (primary) hypertension: Secondary | ICD-10-CM | POA: Insufficient documentation

## 2019-11-23 DIAGNOSIS — Z79899 Other long term (current) drug therapy: Secondary | ICD-10-CM | POA: Diagnosis not present

## 2019-11-23 DIAGNOSIS — Y903 Blood alcohol level of 60-79 mg/100 ml: Secondary | ICD-10-CM | POA: Insufficient documentation

## 2019-11-23 DIAGNOSIS — F259 Schizoaffective disorder, unspecified: Secondary | ICD-10-CM | POA: Insufficient documentation

## 2019-11-23 LAB — COMPREHENSIVE METABOLIC PANEL
ALT: 106 U/L — ABNORMAL HIGH (ref 0–44)
AST: 247 U/L — ABNORMAL HIGH (ref 15–41)
Albumin: 2.5 g/dL — ABNORMAL LOW (ref 3.5–5.0)
Alkaline Phosphatase: 224 U/L — ABNORMAL HIGH (ref 38–126)
Anion gap: 16 — ABNORMAL HIGH (ref 5–15)
BUN: <5 mg/dL — ABNORMAL LOW (ref 6–20)
CO2: 31 mmol/L (ref 22–32)
Calcium: 8.4 mg/dL — ABNORMAL LOW (ref 8.9–10.3)
Chloride: 93 mmol/L — ABNORMAL LOW (ref 98–111)
Creatinine, Ser: 0.57 mg/dL — ABNORMAL LOW (ref 0.61–1.24)
GFR calc Af Amer: >60 mL/min (ref >60)
GFR calc non Af Amer: >60 mL/min (ref >60)
Glucose, Bld: 131 mg/dL — ABNORMAL HIGH (ref 70–99)
Potassium: 2.9 mmol/L — ABNORMAL LOW (ref 3.5–5.1)
Sodium: 140 mmol/L (ref 135–145)
Total Bilirubin: 3.6 mg/dL — ABNORMAL HIGH (ref 0.3–1.2)
Total Protein: 6.4 g/dL — ABNORMAL LOW (ref 6.5–8.1)

## 2019-11-23 LAB — RAPID URINE DRUG SCREEN, HOSP PERFORMED
Amphetamines: NOT DETECTED
Barbiturates: NOT DETECTED
Benzodiazepines: NOT DETECTED
Cocaine: NOT DETECTED
Opiates: NOT DETECTED
Tetrahydrocannabinol: POSITIVE — AB

## 2019-11-23 LAB — CBC
HCT: 37.3 % — ABNORMAL LOW (ref 39.0–52.0)
Hemoglobin: 11.9 g/dL — ABNORMAL LOW (ref 13.0–17.0)
MCH: 33.4 pg (ref 26.0–34.0)
MCHC: 31.9 g/dL (ref 30.0–36.0)
MCV: 104.8 fL — ABNORMAL HIGH (ref 80.0–100.0)
Platelets: 567 10*3/uL — ABNORMAL HIGH (ref 150–400)
RBC: 3.56 MIL/uL — ABNORMAL LOW (ref 4.22–5.81)
RDW: 16.3 % — ABNORMAL HIGH (ref 11.5–15.5)
WBC: 8 10*3/uL (ref 4.0–10.5)
nRBC: 0 % (ref 0.0–0.2)

## 2019-11-23 LAB — ETHANOL: Alcohol, Ethyl (B): 68 mg/dL — ABNORMAL HIGH (ref <10)

## 2019-11-23 MED ORDER — LORAZEPAM 1 MG PO TABS
1.0000 mg | ORAL_TABLET | Freq: Once | ORAL | Status: AC
  Administered 2019-11-23: 1 mg via ORAL
  Filled 2019-11-23: qty 1

## 2019-11-23 MED ORDER — LORAZEPAM 2 MG/ML IJ SOLN
1.0000 mg | Freq: Once | INTRAMUSCULAR | Status: AC
  Administered 2019-11-23: 1 mg via INTRAVENOUS
  Filled 2019-11-23: qty 1

## 2019-11-23 MED ORDER — POTASSIUM CHLORIDE CRYS ER 20 MEQ PO TBCR
40.0000 meq | EXTENDED_RELEASE_TABLET | Freq: Once | ORAL | Status: AC
  Administered 2019-11-23: 40 meq via ORAL
  Filled 2019-11-23: qty 2

## 2019-11-23 MED ORDER — FOLIC ACID 1 MG PO TABS: 1.0000 mg | ORAL_TABLET | Freq: Every day | ORAL | 0 refills | Status: DC

## 2019-11-23 MED ORDER — CHLORDIAZEPOXIDE HCL 25 MG PO CAPS: ORAL_CAPSULE | ORAL | 0 refills | Status: DC

## 2019-11-23 MED ORDER — ONDANSETRON 8 MG PO TBDP: 8.0000 mg | ORAL_TABLET | Freq: Three times a day (TID) | ORAL | 0 refills | Status: DC | PRN

## 2019-11-23 NOTE — ED Triage Notes (Signed)
Pt reports having abnormal liver enzymes, swelling to bilateral legs and abd, and etoh withdrawals. Last drank etoh yesterday. CIWA 5 in triage.

## 2019-11-23 NOTE — ED Provider Notes (Signed)
[facility] EMERGENCY DEPARTMENT Provider Note   CSN: [id number] Arrival date & time: 11/23/19  1310     History Chief Complaint  Patient presents with  . Abnormal Lab  . Alcohol Problem    [name] is a 49 y.o. male.  HPI   Patient presented to the ED for evaluation of abnormal liver enzymes.  Patient has history of alcohol abuse.  He saw his doctor in the office on March 22.  Patient had laboratory tests performed at that time.  Sounds like they were just able to reach him today and notify the patient of his abnormal lab test.  Patient admits to continued alcohol use but he did stop drinking yesterday.  Patient feels like he is withdrawing somewhat.  He denies any nausea or vomiting.  He has noticed leg swelling.  He denies any fevers or chills.  Past Medical History:  Diagnosis Date  . Alcohol abuse   . Anxiety    at age 4  . Depression    at age 40  . Psychosis ([place])   . PTSD (post-traumatic stress disorder)   . Schizoaffective disorder   . Seizure disorder ([place])    related to etoh seizure    Patient Active Problem List   Diagnosis Date Noted  . Rhabdomyolysis 03/06/2019  . Alcoholic hepatitis without ascites 03/06/2019  . Alcohol withdrawal ([place]) 01/15/2019  . HTN (hypertension) 01/10/2019  . Fall 01/10/2019  . Alcohol intoxication ([place]) 01/10/2019  . Schizoaffective disorder, bipolar type ([place]) 06/02/2018  . Alcohol dependence with uncomplicated withdrawal ([place]) 02/22/2016  . Alcohol-induced mood disorder ([place]) 02/22/2016  . Gout 10/22/2015  . GERD (gastroesophageal reflux disease) 10/22/2015  . Alcohol withdrawal seizure with complication ([place]) [id number]  . Schizoaffective disorder, depressive type ([place])   . History of alcohol abuse   . Tobacco dependence due to cigarettes 11/20/2014  . Alcohol abuse with intoxication ([place])   . Hyponatremia 10/18/2014  . Abnormal LFTs   . Alcohol abuse 02/09/2014  . Post traumatic stress  disorder (PTSD) 11/03/2011  . Hypokalemia 07/02/2011    Past Surgical History:  Procedure Laterality Date  . FOOT SURGERY     right  . HAND SURGERY         Family History  Problem Relation Age of Onset  . Alcoholism Mother   . CAD Father   . Depression Brother     Social History   Tobacco Use  . Smoking status: Current Every Day Smoker    Packs/day: 1.00    Years: 24.00    Pack years: 24.00    Types: Cigarettes  . Smokeless tobacco: Never Used  Substance Use Topics  . Alcohol use: Yes    Alcohol/week: 0.0 standard drinks    Comment: drinking daily   . Drug use: Yes    Frequency: 1.0 times per week    Types: Marijuana    Comment: THC 2 to 3 times per month. last use 09/24/2015    Home Medications Prior to Admission medications   Medication Sig Start Date End Date Taking? Authorizing Provider  allopurinol (ZYLOPRIM) 100 MG tablet Take 2 tablets (200 mg total) by mouth daily. 06/09/19  Yes [name], MD  amLODipine (NORVASC) 10 MG tablet Take 1 tablet (10 mg total) by mouth daily. 06/09/19  Yes [name], MD  loperamide (IMODIUM) 2 MG capsule Take 4 mg by mouth as needed for diarrhea or loose stools.   Yes [provider]  metoprolol tartrate (LOPRESSOR) 25  MG tablet Take 0.5 tablets (12.5 mg total) by mouth 2 (two) times daily. 06/09/19  Yes [name], MD  Misc. Devices MISC Blood Pressure monitor. Dx :Hypertension 06/09/19  Yes Newlin, Enobong, MD  pantoprazole (PROTONIX) 40 MG tablet Take 1 tablet (40 mg total) by mouth daily. 06/09/19  Yes [name], MD  chlordiazePOXIDE (LIBRIUM) 25 MG capsule 50mg  PO TID x 1D, then 25-50mg  PO BID X 1D, then 25-50mg  PO QD X 1D 11/23/19   Dorie Rank, MD  feeding supplement, ENSURE ENLIVE, (ENSURE ENLIVE) LIQD Take 237 mLs by mouth 2 (two) times daily between meals. Patient not taking: Reported on 10/18/2019 10/07/19   Madalyn Rob, MD  folic acid (FOLVITE) 1 MG tablet Take 1 tablet (1 mg total) by mouth  daily. 11/23/19   Dorie Rank, MD  ondansetron (ZOFRAN ODT) 8 MG disintegrating tablet Take 1 tablet (8 mg total) by mouth every 8 (eight) hours as needed for nausea or vomiting. 11/23/19   Dorie Rank, MD    Allergies    Wellbutrin [bupropion]  Review of Systems   Review of Systems  All other systems reviewed and are negative.   Physical Exam Updated Vital Signs BP 124/74   Pulse 81   Temp 98 F (36.7 C) (Oral)   Resp (!) 21   Ht 1.829 m (6')   Wt 99.8 kg   SpO2 94%   BMI 29.84 kg/m   Physical Exam Vitals and nursing note reviewed.  Constitutional:      General: He is not in acute distress.    Appearance: He is well-developed.  HENT:     Head: Normocephalic and atraumatic.     Right Ear: External ear normal.     Left Ear: External ear normal.  Eyes:     General: No scleral icterus.       Right eye: No discharge.        Left eye: No discharge.     Conjunctiva/sclera: Conjunctivae normal.  Neck:     Trachea: No tracheal deviation.  Cardiovascular:     Rate and Rhythm: Normal rate and regular rhythm.  Pulmonary:     Effort: Pulmonary effort is normal. No respiratory distress.     Breath sounds: Normal breath sounds. No stridor. No wheezing or rales.  Abdominal:     General: Bowel sounds are normal. There is no distension.     Palpations: Abdomen is soft.     Tenderness: There is no abdominal tenderness. There is no guarding or rebound.     Comments: No fluid wave appreciated  Musculoskeletal:        General: No tenderness.     Cervical back: Neck supple.     Right lower leg: Edema present.     Left lower leg: Edema present.  Skin:    General: Skin is warm and dry.     Findings: No rash.  Neurological:     Mental Status: He is alert.     Cranial Nerves: No cranial nerve deficit (no facial droop, extraocular movements intact, no slurred speech).     Sensory: No sensory deficit.     Motor: No abnormal muscle tone or seizure activity.     Coordination: Coordination  normal.     Comments: No tremor noted     ED Results / Procedures / Treatments   Labs (all labs ordered are listed, but only abnormal results are displayed) Labs Reviewed  COMPREHENSIVE METABOLIC PANEL - Abnormal; Notable for the following components:  Result Value   Potassium 2.9 (*)    Chloride 93 (*)    Glucose, Bld 131 (*)    BUN <5 (*)    Creatinine, Ser 0.57 (*)    Calcium 8.4 (*)    Total Protein 6.4 (*)    Albumin 2.5 (*)    AST 247 (*)    ALT 106 (*)    Alkaline Phosphatase 224 (*)    Total Bilirubin 3.6 (*)    Anion gap 16 (*)    All other components within normal limits  ETHANOL - Abnormal; Notable for the following components:   Alcohol, Ethyl (B) 68 (*)    All other components within normal limits  CBC - Abnormal; Notable for the following components:   RBC 3.56 (*)    Hemoglobin 11.9 (*)    HCT 37.3 (*)    MCV 104.8 (*)    RDW 16.3 (*)    Platelets 567 (*)    All other components within normal limits  RAPID URINE DRUG SCREEN, HOSP PERFORMED - Abnormal; Notable for the following components:   Tetrahydrocannabinol POSITIVE (*)    All other components within normal limits    EKG None  Radiology No results found.  Procedures Procedures (including critical care time)  Medications Ordered in ED Medications  LORazepam (ATIVAN) tablet 1 mg (has no administration in time range)  LORazepam (ATIVAN) injection 1 mg (1 mg Intravenous Given 11/23/19 2142)  potassium chloride SA (KLOR-CON) CR tablet 40 mEq (40 mEq Oral Given 11/23/19 2143)    ED Course  I have reviewed the triage vital signs and the nursing notes.  Pertinent labs & imaging results that were available during my care of the patient were reviewed by me and considered in my medical decision making (see chart for details).  Clinical Course as of Nov 22 2313  Wed Nov 23, 2019  2116 Labs reviewed.  Multiple abnormalities noted on his electrolyte panel included elevated LFTs.  Potassium is also  abnormal.  Patient does have alcohol still in his system.  CBC does show a stable anemia.   [JK]  2117 Labs are consistent with his chronic alcohol disease.  He has evidence of liver disease as well now with hyperbilirubinemia hypoalbuminemia   [JK]  2117 Bilirubin however is improving down from 6.6   [JK]  2314 Patient has remained stable.  No signs of DTs or severe withdrawal    [JK]    Clinical Course User Index [JK] Dorie Rank, MD   MDM Rules/Calculators/A&P                      Patient presents with abnormal laboratory tests associated with his alcoholism.  He has a macrocytic anemia.  He has hypokalemia hypoalbuminemia and hyperbilirubinemia.  Patient does indicate that he is trying to quit and last drank yesterday.  Patient is not showing signs of significant alcohol withdrawal.  He is not tachycardic or hypertensive.  LFTs are elevated but consistent with recent values, bili is improving. I have ordered a dose of Ativan.  We will give him potassium for his hypokalemia.  I think the patient can be discharged on a Librium taper as well as potassium supplements.  I counseled again on alcohol cessation. Final Clinical Impression(s) / ED Diagnoses Final diagnoses:  Alcohol abuse  Liver disease due to alcohol [facility])    Rx / DC Orders ED Discharge Orders         Ordered  chlordiazePOXIDE (LIBRIUM) 25 MG capsule     11/23/19 2313    ondansetron (ZOFRAN ODT) 8 MG disintegrating tablet  Every 8 hours PRN     [id number]    folic acid (FOLVITE) 1 MG tablet  Daily     11/23/19 2313           Dorie Rank, MD 11/23/19 2315

## 2019-11-23 NOTE — Discharge Instructions (Addendum)
Do not drink any alcohol.  Take the medications to help with your withdrawal symptoms.  Take the medications as prescribed.   Also take a multivitamin daily.  Try to increase her nutritional intake.  Follow up with a treatment center to help you with your alcohol use.

## 2019-11-23 NOTE — ED Notes (Signed)
Pt was discharged from the ED. Pt read and understood discharge paperwork. Pt had vital signs completed. Pt conscious, breathing, and A&Ox4. No distress noted. Pt speaking in complete sentences. Pt ambulated out of the ED with a smooth and steady gait. E-signature not available.  

## 2019-12-21 ENCOUNTER — Other Ambulatory Visit: Payer: Self-pay | Admitting: Family Medicine

## 2019-12-21 DIAGNOSIS — M1A9XX0 Chronic gout, unspecified, without tophus (tophi): Secondary | ICD-10-CM

## 2019-12-21 DIAGNOSIS — I10 Essential (primary) hypertension: Secondary | ICD-10-CM

## 2019-12-21 DIAGNOSIS — K219 Gastro-esophageal reflux disease without esophagitis: Secondary | ICD-10-CM

## 2019-12-22 MED ORDER — PANTOPRAZOLE SODIUM 40 MG PO TBEC: 40.0000 mg | DELAYED_RELEASE_TABLET | Freq: Every day | ORAL | 0 refills | Status: DC

## 2019-12-22 MED ORDER — METOPROLOL TARTRATE 25 MG PO TABS: 12.5000 mg | ORAL_TABLET | Freq: Two times a day (BID) | ORAL | 0 refills | Status: DC

## 2019-12-22 MED ORDER — AMLODIPINE BESYLATE 10 MG PO TABS: 10.0000 mg | ORAL_TABLET | Freq: Every day | ORAL | 0 refills | Status: DC

## 2020-04-20 ENCOUNTER — Other Ambulatory Visit: Payer: Self-pay

## 2020-04-20 DIAGNOSIS — I10 Essential (primary) hypertension: Secondary | ICD-10-CM

## 2020-04-20 DIAGNOSIS — M1A9XX0 Chronic gout, unspecified, without tophus (tophi): Secondary | ICD-10-CM

## 2020-04-20 DIAGNOSIS — K219 Gastro-esophageal reflux disease without esophagitis: Secondary | ICD-10-CM

## 2020-04-20 MED ORDER — PANTOPRAZOLE SODIUM 40 MG PO TBEC: 40.0000 mg | DELAYED_RELEASE_TABLET | Freq: Every day | ORAL | 0 refills | Status: DC

## 2020-04-20 MED ORDER — AMLODIPINE BESYLATE 10 MG PO TABS: 10.0000 mg | ORAL_TABLET | Freq: Every day | ORAL | 0 refills | Status: DC

## 2020-04-20 MED ORDER — METOPROLOL TARTRATE 25 MG PO TABS: 12.5000 mg | ORAL_TABLET | Freq: Two times a day (BID) | ORAL | 0 refills | Status: DC

## 2020-04-20 MED ORDER — ALLOPURINOL 100 MG PO TABS: 200.0000 mg | ORAL_TABLET | Freq: Every day | ORAL | 0 refills | Status: DC

## 2020-05-02 ENCOUNTER — Encounter (HOSPITAL_COMMUNITY): Payer: Self-pay

## 2020-05-02 ENCOUNTER — Inpatient Hospital Stay (HOSPITAL_COMMUNITY)
Admission: EM | Admit: 2020-05-02 | Discharge: 2020-05-07 | DRG: 432 | Disposition: A | Discharge status: Home / Self Care | Payer: Medicare Other | Attending: Internal Medicine | Admitting: Internal Medicine

## 2020-05-02 ENCOUNTER — Other Ambulatory Visit: Payer: Self-pay

## 2020-05-02 DIAGNOSIS — K766 Portal hypertension: Secondary | ICD-10-CM | POA: Diagnosis present

## 2020-05-02 DIAGNOSIS — K7010 Alcoholic hepatitis without ascites: Secondary | ICD-10-CM | POA: Diagnosis not present

## 2020-05-02 DIAGNOSIS — K3189 Other diseases of stomach and duodenum: Secondary | ICD-10-CM | POA: Diagnosis present

## 2020-05-02 DIAGNOSIS — R7989 Other specified abnormal findings of blood chemistry: Secondary | ICD-10-CM | POA: Diagnosis present

## 2020-05-02 DIAGNOSIS — K700 Alcoholic fatty liver: Secondary | ICD-10-CM | POA: Diagnosis present

## 2020-05-02 DIAGNOSIS — F1020 Alcohol dependence, uncomplicated: Secondary | ICD-10-CM | POA: Diagnosis present

## 2020-05-02 DIAGNOSIS — R188 Other ascites: Secondary | ICD-10-CM

## 2020-05-02 DIAGNOSIS — K760 Fatty (change of) liver, not elsewhere classified: Secondary | ICD-10-CM | POA: Diagnosis present

## 2020-05-02 DIAGNOSIS — F329 Major depressive disorder, single episode, unspecified: Secondary | ICD-10-CM | POA: Diagnosis present

## 2020-05-02 DIAGNOSIS — K219 Gastro-esophageal reflux disease without esophagitis: Secondary | ICD-10-CM

## 2020-05-02 DIAGNOSIS — F4310 Post-traumatic stress disorder, unspecified: Secondary | ICD-10-CM | POA: Diagnosis present

## 2020-05-02 DIAGNOSIS — R9431 Abnormal electrocardiogram [ECG] [EKG]: Secondary | ICD-10-CM | POA: Diagnosis present

## 2020-05-02 DIAGNOSIS — E872 Acidosis: Secondary | ICD-10-CM

## 2020-05-02 DIAGNOSIS — Z818 Family history of other mental and behavioral disorders: Secondary | ICD-10-CM

## 2020-05-02 DIAGNOSIS — E876 Hypokalemia: Secondary | ICD-10-CM | POA: Diagnosis not present

## 2020-05-02 DIAGNOSIS — D649 Anemia, unspecified: Secondary | ICD-10-CM | POA: Diagnosis present

## 2020-05-02 DIAGNOSIS — F259 Schizoaffective disorder, unspecified: Secondary | ICD-10-CM | POA: Diagnosis present

## 2020-05-02 DIAGNOSIS — I10 Essential (primary) hypertension: Secondary | ICD-10-CM

## 2020-05-02 DIAGNOSIS — D638 Anemia in other chronic diseases classified elsewhere: Secondary | ICD-10-CM | POA: Diagnosis present

## 2020-05-02 DIAGNOSIS — K921 Melena: Secondary | ICD-10-CM | POA: Diagnosis present

## 2020-05-02 DIAGNOSIS — E871 Hypo-osmolality and hyponatremia: Secondary | ICD-10-CM | POA: Diagnosis present

## 2020-05-02 DIAGNOSIS — I8511 Secondary esophageal varices with bleeding: Secondary | ICD-10-CM | POA: Diagnosis present

## 2020-05-02 DIAGNOSIS — E874 Mixed disorder of acid-base balance: Secondary | ICD-10-CM | POA: Diagnosis present

## 2020-05-02 DIAGNOSIS — Z79899 Other long term (current) drug therapy: Secondary | ICD-10-CM

## 2020-05-02 DIAGNOSIS — Z20822 Contact with and (suspected) exposure to covid-19: Secondary | ICD-10-CM | POA: Diagnosis present

## 2020-05-02 DIAGNOSIS — R609 Edema, unspecified: Secondary | ICD-10-CM | POA: Diagnosis present

## 2020-05-02 DIAGNOSIS — K7030 Alcoholic cirrhosis of liver without ascites: Secondary | ICD-10-CM | POA: Diagnosis present

## 2020-05-02 DIAGNOSIS — Z8249 Family history of ischemic heart disease and other diseases of the circulatory system: Secondary | ICD-10-CM

## 2020-05-02 DIAGNOSIS — E8729 Other acidosis: Secondary | ICD-10-CM

## 2020-05-02 DIAGNOSIS — Y906 Blood alcohol level of 120-199 mg/100 ml: Secondary | ICD-10-CM | POA: Diagnosis present

## 2020-05-02 DIAGNOSIS — R55 Syncope and collapse: Secondary | ICD-10-CM | POA: Diagnosis present

## 2020-05-02 DIAGNOSIS — K2970 Gastritis, unspecified, without bleeding: Secondary | ICD-10-CM | POA: Diagnosis present

## 2020-05-02 DIAGNOSIS — Z9114 Patient's other noncompliance with medication regimen: Secondary | ICD-10-CM

## 2020-05-02 DIAGNOSIS — F10229 Alcohol dependence with intoxication, unspecified: Secondary | ICD-10-CM | POA: Diagnosis present

## 2020-05-02 DIAGNOSIS — F1290 Cannabis use, unspecified, uncomplicated: Secondary | ICD-10-CM | POA: Diagnosis present

## 2020-05-02 DIAGNOSIS — Z888 Allergy status to other drugs, medicaments and biological substances status: Secondary | ICD-10-CM

## 2020-05-02 DIAGNOSIS — J439 Emphysema, unspecified: Secondary | ICD-10-CM | POA: Diagnosis present

## 2020-05-02 DIAGNOSIS — R0600 Dyspnea, unspecified: Secondary | ICD-10-CM

## 2020-05-02 DIAGNOSIS — G40909 Epilepsy, unspecified, not intractable, without status epilepticus: Secondary | ICD-10-CM | POA: Diagnosis present

## 2020-05-02 DIAGNOSIS — D529 Folate deficiency anemia, unspecified: Secondary | ICD-10-CM | POA: Diagnosis present

## 2020-05-02 DIAGNOSIS — K7031 Alcoholic cirrhosis of liver with ascites: Secondary | ICD-10-CM | POA: Diagnosis present

## 2020-05-02 DIAGNOSIS — E669 Obesity, unspecified: Secondary | ICD-10-CM | POA: Diagnosis present

## 2020-05-02 DIAGNOSIS — D539 Nutritional anemia, unspecified: Secondary | ICD-10-CM | POA: Diagnosis present

## 2020-05-02 DIAGNOSIS — F17210 Nicotine dependence, cigarettes, uncomplicated: Secondary | ICD-10-CM | POA: Diagnosis present

## 2020-05-02 DIAGNOSIS — Z811 Family history of alcohol abuse and dependence: Secondary | ICD-10-CM

## 2020-05-02 DIAGNOSIS — Z6831 Body mass index (BMI) 31.0-31.9, adult: Secondary | ICD-10-CM

## 2020-05-02 HISTORY — DX: Essential (primary) hypertension: I10

## 2020-05-02 HISTORY — DX: Anemia, unspecified: D64.9

## 2020-05-02 HISTORY — DX: Gastro-esophageal reflux disease without esophagitis: K21.9

## 2020-05-02 NOTE — ED Triage Notes (Signed)
Pt BIB [facility] from home c/o a possible seizure or syncopal episode. Pt states he is currently going through alcohol withdrawal and last he remembers was sitting in his chair then he woke up in the floor in the hallway. Pt is c/o of neck pain. Pt has a hx of ascites. Pt is also c/o of left leg swelling. Pt has pitting edema in the left leg.

## 2020-05-03 ENCOUNTER — Inpatient Hospital Stay (HOSPITAL_COMMUNITY): Payer: Medicare Other

## 2020-05-03 ENCOUNTER — Encounter (HOSPITAL_COMMUNITY): Payer: Self-pay | Admitting: Family Medicine

## 2020-05-03 ENCOUNTER — Emergency Department (HOSPITAL_COMMUNITY): Payer: Medicare Other

## 2020-05-03 DIAGNOSIS — K3189 Other diseases of stomach and duodenum: Secondary | ICD-10-CM | POA: Diagnosis not present

## 2020-05-03 DIAGNOSIS — Z20822 Contact with and (suspected) exposure to covid-19: Secondary | ICD-10-CM | POA: Diagnosis present

## 2020-05-03 DIAGNOSIS — F1020 Alcohol dependence, uncomplicated: Secondary | ICD-10-CM | POA: Diagnosis present

## 2020-05-03 DIAGNOSIS — E872 Acidosis: Secondary | ICD-10-CM | POA: Diagnosis not present

## 2020-05-03 DIAGNOSIS — F259 Schizoaffective disorder, unspecified: Secondary | ICD-10-CM | POA: Diagnosis present

## 2020-05-03 DIAGNOSIS — K766 Portal hypertension: Secondary | ICD-10-CM | POA: Diagnosis present

## 2020-05-03 DIAGNOSIS — K7031 Alcoholic cirrhosis of liver with ascites: Secondary | ICD-10-CM | POA: Diagnosis not present

## 2020-05-03 DIAGNOSIS — I8510 Secondary esophageal varices without bleeding: Secondary | ICD-10-CM | POA: Diagnosis not present

## 2020-05-03 DIAGNOSIS — I8511 Secondary esophageal varices with bleeding: Secondary | ICD-10-CM | POA: Diagnosis present

## 2020-05-03 DIAGNOSIS — E871 Hypo-osmolality and hyponatremia: Secondary | ICD-10-CM | POA: Diagnosis present

## 2020-05-03 DIAGNOSIS — Z888 Allergy status to other drugs, medicaments and biological substances status: Secondary | ICD-10-CM | POA: Diagnosis not present

## 2020-05-03 DIAGNOSIS — Z811 Family history of alcohol abuse and dependence: Secondary | ICD-10-CM | POA: Diagnosis not present

## 2020-05-03 DIAGNOSIS — Y906 Blood alcohol level of 120-199 mg/100 ml: Secondary | ICD-10-CM | POA: Diagnosis present

## 2020-05-03 DIAGNOSIS — E669 Obesity, unspecified: Secondary | ICD-10-CM | POA: Diagnosis present

## 2020-05-03 DIAGNOSIS — R55 Syncope and collapse: Secondary | ICD-10-CM | POA: Diagnosis not present

## 2020-05-03 DIAGNOSIS — R7989 Other specified abnormal findings of blood chemistry: Secondary | ICD-10-CM | POA: Diagnosis present

## 2020-05-03 DIAGNOSIS — F488 Other specified nonpsychotic mental disorders: Secondary | ICD-10-CM | POA: Diagnosis not present

## 2020-05-03 DIAGNOSIS — Z9114 Patient's other noncompliance with medication regimen: Secondary | ICD-10-CM | POA: Diagnosis not present

## 2020-05-03 DIAGNOSIS — K760 Fatty (change of) liver, not elsewhere classified: Secondary | ICD-10-CM | POA: Diagnosis present

## 2020-05-03 DIAGNOSIS — E874 Mixed disorder of acid-base balance: Secondary | ICD-10-CM | POA: Diagnosis present

## 2020-05-03 DIAGNOSIS — R9431 Abnormal electrocardiogram [ECG] [EKG]: Secondary | ICD-10-CM | POA: Diagnosis present

## 2020-05-03 DIAGNOSIS — Z8249 Family history of ischemic heart disease and other diseases of the circulatory system: Secondary | ICD-10-CM | POA: Diagnosis not present

## 2020-05-03 DIAGNOSIS — K7010 Alcoholic hepatitis without ascites: Secondary | ICD-10-CM | POA: Diagnosis present

## 2020-05-03 DIAGNOSIS — K7030 Alcoholic cirrhosis of liver without ascites: Secondary | ICD-10-CM | POA: Diagnosis present

## 2020-05-03 DIAGNOSIS — K219 Gastro-esophageal reflux disease without esophagitis: Secondary | ICD-10-CM | POA: Diagnosis present

## 2020-05-03 DIAGNOSIS — I10 Essential (primary) hypertension: Secondary | ICD-10-CM | POA: Diagnosis present

## 2020-05-03 DIAGNOSIS — F17210 Nicotine dependence, cigarettes, uncomplicated: Secondary | ICD-10-CM | POA: Diagnosis present

## 2020-05-03 DIAGNOSIS — Z6831 Body mass index (BMI) 31.0-31.9, adult: Secondary | ICD-10-CM | POA: Diagnosis not present

## 2020-05-03 DIAGNOSIS — Z79899 Other long term (current) drug therapy: Secondary | ICD-10-CM | POA: Diagnosis not present

## 2020-05-03 DIAGNOSIS — Z818 Family history of other mental and behavioral disorders: Secondary | ICD-10-CM | POA: Diagnosis not present

## 2020-05-03 DIAGNOSIS — E876 Hypokalemia: Secondary | ICD-10-CM | POA: Diagnosis present

## 2020-05-03 DIAGNOSIS — F10229 Alcohol dependence with intoxication, unspecified: Secondary | ICD-10-CM | POA: Diagnosis present

## 2020-05-03 LAB — COMPREHENSIVE METABOLIC PANEL
ALT: 80 U/L — ABNORMAL HIGH (ref 0–44)
AST: 192 U/L — ABNORMAL HIGH (ref 15–41)
Albumin: 1.8 g/dL — ABNORMAL LOW (ref 3.5–5.0)
Alkaline Phosphatase: 144 U/L — ABNORMAL HIGH (ref 38–126)
Anion gap: 19 — ABNORMAL HIGH (ref 5–15)
BUN: 6 mg/dL (ref 6–20)
CO2: 35 mmol/L — ABNORMAL HIGH (ref 22–32)
Calcium: 7.4 mg/dL — ABNORMAL LOW (ref 8.9–10.3)
Chloride: 77 mmol/L — ABNORMAL LOW (ref 98–111)
Creatinine, Ser: 0.62 mg/dL (ref 0.61–1.24)
GFR calc Af Amer: >60 mL/min (ref >60)
GFR calc non Af Amer: >60 mL/min (ref >60)
Glucose, Bld: 121 mg/dL — ABNORMAL HIGH (ref 70–99)
Potassium: 4.1 mmol/L (ref 3.5–5.1)
Sodium: 131 mmol/L — ABNORMAL LOW (ref 135–145)
Total Bilirubin: 6.7 mg/dL — ABNORMAL HIGH (ref 0.3–1.2)
Total Protein: 5 g/dL — ABNORMAL LOW (ref 6.5–8.1)

## 2020-05-03 LAB — BASIC METABOLIC PANEL
Anion gap: 21 — ABNORMAL HIGH (ref 5–15)
BUN: 6 mg/dL (ref 6–20)
CO2: 35 mmol/L — ABNORMAL HIGH (ref 22–32)
Calcium: 7.6 mg/dL — ABNORMAL LOW (ref 8.9–10.3)
Chloride: 73 mmol/L — ABNORMAL LOW (ref 98–111)
Creatinine, Ser: 0.72 mg/dL (ref 0.61–1.24)
GFR calc Af Amer: >60 mL/min (ref >60)
GFR calc non Af Amer: >60 mL/min (ref >60)
Glucose, Bld: 127 mg/dL — ABNORMAL HIGH (ref 70–99)
Potassium: <2 mmol/L — CL (ref 3.5–5.1)
Sodium: 129 mmol/L — ABNORMAL LOW (ref 135–145)

## 2020-05-03 LAB — IRON AND TIBC
Iron: 122 ug/dL (ref 45–182)
Saturation Ratios: 95 % — ABNORMAL HIGH (ref 17.9–39.5)
TIBC: 129 ug/dL — ABNORMAL LOW (ref 250–450)
UIBC: 7 ug/dL

## 2020-05-03 LAB — CBC WITH DIFFERENTIAL/PLATELET
Abs Immature Granulocytes: 0.21 10*3/uL — ABNORMAL HIGH (ref 0.00–0.07)
Basophils Absolute: 0 10*3/uL (ref 0.0–0.1)
Basophils Relative: 0 %
Eosinophils Absolute: 0 10*3/uL (ref 0.0–0.5)
Eosinophils Relative: 0 %
HCT: 23.5 % — ABNORMAL LOW (ref 39.0–52.0)
Hemoglobin: 8.2 g/dL — ABNORMAL LOW (ref 13.0–17.0)
Immature Granulocytes: 2 %
Lymphocytes Relative: 20 %
Lymphs Abs: 2.3 10*3/uL (ref 0.7–4.0)
MCH: 32.5 pg (ref 26.0–34.0)
MCHC: 34.9 g/dL (ref 30.0–36.0)
MCV: 93.3 fL (ref 80.0–100.0)
Monocytes Absolute: 0.7 10*3/uL (ref 0.1–1.0)
Monocytes Relative: 6 %
Neutro Abs: 8.1 10*3/uL — ABNORMAL HIGH (ref 1.7–7.7)
Neutrophils Relative %: 72 %
Platelets: 384 10*3/uL (ref 150–400)
RBC: 2.52 MIL/uL — ABNORMAL LOW (ref 4.22–5.81)
RDW: 23 % — ABNORMAL HIGH (ref 11.5–15.5)
WBC: 11.3 10*3/uL — ABNORMAL HIGH (ref 4.0–10.5)
nRBC: 0.4 % — ABNORMAL HIGH (ref 0.0–0.2)

## 2020-05-03 LAB — MAGNESIUM: Magnesium: 1.4 mg/dL — ABNORMAL LOW (ref 1.7–2.4)

## 2020-05-03 LAB — RAPID URINE DRUG SCREEN, HOSP PERFORMED
Amphetamines: NOT DETECTED
Barbiturates: NOT DETECTED
Benzodiazepines: NOT DETECTED
Cocaine: NOT DETECTED
Opiates: NOT DETECTED
Tetrahydrocannabinol: POSITIVE — AB

## 2020-05-03 LAB — CBC
HCT: 20.9 % — ABNORMAL LOW (ref 39.0–52.0)
Hemoglobin: 7.4 g/dL — ABNORMAL LOW (ref 13.0–17.0)
MCH: 33 pg (ref 26.0–34.0)
MCHC: 35.4 g/dL (ref 30.0–36.0)
MCV: 93.3 fL (ref 80.0–100.0)
Platelets: 341 10*3/uL (ref 150–400)
RBC: 2.24 MIL/uL — ABNORMAL LOW (ref 4.22–5.81)
RDW: 23.1 % — ABNORMAL HIGH (ref 11.5–15.5)
WBC: 10.2 10*3/uL (ref 4.0–10.5)
nRBC: 0.2 % (ref 0.0–0.2)

## 2020-05-03 LAB — HEPATIC FUNCTION PANEL
ALT: 94 U/L — ABNORMAL HIGH (ref 0–44)
AST: 233 U/L — ABNORMAL HIGH (ref 15–41)
Albumin: 1.9 g/dL — ABNORMAL LOW (ref 3.5–5.0)
Alkaline Phosphatase: 152 U/L — ABNORMAL HIGH (ref 38–126)
Bilirubin, Direct: 4.2 mg/dL — ABNORMAL HIGH (ref 0.0–0.2)
Indirect Bilirubin: 3.4 mg/dL — ABNORMAL HIGH (ref 0.3–0.9)
Total Bilirubin: 7.6 mg/dL — ABNORMAL HIGH (ref 0.3–1.2)
Total Protein: 5.4 g/dL — ABNORMAL LOW (ref 6.5–8.1)

## 2020-05-03 LAB — BRAIN NATRIURETIC PEPTIDE: B Natriuretic Peptide: 128.7 pg/mL — ABNORMAL HIGH (ref 0.0–100.0)

## 2020-05-03 LAB — AMMONIA: Ammonia: 46 umol/L — ABNORMAL HIGH (ref 9–35)

## 2020-05-03 LAB — RETICULOCYTES
Immature Retic Fract: 22.3 % — ABNORMAL HIGH (ref 2.3–15.9)
RBC.: 2.26 MIL/uL — ABNORMAL LOW (ref 4.22–5.81)
Retic Count, Absolute: 15.4 10*3/uL — ABNORMAL LOW (ref 19.0–186.0)
Retic Ct Pct: 0.7 % (ref 0.4–3.1)

## 2020-05-03 LAB — OSMOLALITY, URINE: Osmolality, Ur: 422 mOsm/kg (ref 300–900)

## 2020-05-03 LAB — TROPONIN I (HIGH SENSITIVITY)
Troponin I (High Sensitivity): 14 ng/L (ref <18)
Troponin I (High Sensitivity): 18 ng/L — ABNORMAL HIGH (ref <18)

## 2020-05-03 LAB — TSH: TSH: 0.987 u[IU]/mL (ref 0.350–4.500)

## 2020-05-03 LAB — HIV ANTIBODY (ROUTINE TESTING W REFLEX): HIV Screen 4th Generation wRfx: NONREACTIVE

## 2020-05-03 LAB — FOLATE: Folate: 2.9 ng/mL — ABNORMAL LOW (ref >5.9)

## 2020-05-03 LAB — OSMOLALITY: Osmolality: 289 mOsm/kg (ref 275–295)

## 2020-05-03 LAB — SODIUM, URINE, RANDOM: Sodium, Ur: <10 mmol/L

## 2020-05-03 LAB — ETHANOL: Alcohol, Ethyl (B): 144 mg/dL — ABNORMAL HIGH (ref <10)

## 2020-05-03 LAB — FERRITIN: Ferritin: 2307 ng/mL — ABNORMAL HIGH (ref 24–336)

## 2020-05-03 LAB — SARS CORONAVIRUS 2 BY RT PCR (HOSPITAL ORDER, PERFORMED IN ~~LOC~~ HOSPITAL LAB): SARS Coronavirus 2: NEGATIVE

## 2020-05-03 LAB — VITAMIN B12: Vitamin B-12: 612 pg/mL (ref 180–914)

## 2020-05-03 LAB — D-DIMER, QUANTITATIVE: D-Dimer, Quant: 0.68 ug/mL-FEU — ABNORMAL HIGH (ref 0.00–0.50)

## 2020-05-03 MED ORDER — PROCHLORPERAZINE EDISYLATE 10 MG/2ML IJ SOLN: 10.0000 mg | Freq: Four times a day (QID) | INTRAMUSCULAR | Status: DC | PRN

## 2020-05-03 MED ORDER — LORAZEPAM 2 MG/ML IJ SOLN
0.0000 mg | Freq: Four times a day (QID) | INTRAMUSCULAR | Status: AC
  Administered 2020-05-03: 1 mg via INTRAVENOUS
  Administered 2020-05-03 – 2020-05-04 (×4): 2 mg via INTRAVENOUS
  Filled 2020-05-03 (×5): qty 1

## 2020-05-03 MED ORDER — OXYCODONE HCL 5 MG PO TABS
5.0000 mg | ORAL_TABLET | Freq: Once | ORAL | Status: AC
  Administered 2020-05-03: 5 mg via ORAL
  Filled 2020-05-03: qty 1

## 2020-05-03 MED ORDER — IOHEXOL 350 MG/ML SOLN
100.0000 mL | Freq: Once | INTRAVENOUS | Status: AC | PRN
  Administered 2020-05-03: 100 mL via INTRAVENOUS

## 2020-05-03 MED ORDER — ADULT MULTIVITAMIN W/MINERALS CH
1.0000 | ORAL_TABLET | Freq: Every day | ORAL | Status: DC
  Administered 2020-05-03 – 2020-05-07 (×5): 1 via ORAL
  Filled 2020-05-03 (×5): qty 1

## 2020-05-03 MED ORDER — MAGNESIUM SULFATE 2 GM/50ML IV SOLN
2.0000 g | Freq: Once | INTRAVENOUS | Status: AC
  Administered 2020-05-03: 2 g via INTRAVENOUS
  Filled 2020-05-03: qty 50

## 2020-05-03 MED ORDER — POTASSIUM CHLORIDE 10 MEQ/100ML IV SOLN
10.0000 meq | Freq: Once | INTRAVENOUS | Status: AC
  Administered 2020-05-03: 10 meq via INTRAVENOUS
  Filled 2020-05-03: qty 100

## 2020-05-03 MED ORDER — THIAMINE HCL 100 MG PO TABS
100.0000 mg | ORAL_TABLET | Freq: Every day | ORAL | Status: DC
  Administered 2020-05-03 – 2020-05-07 (×5): 100 mg via ORAL
  Filled 2020-05-03 (×5): qty 1

## 2020-05-03 MED ORDER — FOLIC ACID 1 MG PO TABS
1.0000 mg | ORAL_TABLET | Freq: Every day | ORAL | Status: DC
  Administered 2020-05-03 – 2020-05-07 (×5): 1 mg via ORAL
  Filled 2020-05-03 (×6): qty 1

## 2020-05-03 MED ORDER — AMLODIPINE BESYLATE 10 MG PO TABS
10.0000 mg | ORAL_TABLET | Freq: Every day | ORAL | Status: DC
  Administered 2020-05-03 – 2020-05-04 (×2): 10 mg via ORAL
  Filled 2020-05-03: qty 1
  Filled 2020-05-03: qty 2

## 2020-05-03 MED ORDER — LORAZEPAM 1 MG PO TABS
0.0000 mg | ORAL_TABLET | Freq: Four times a day (QID) | ORAL | Status: AC
  Administered 2020-05-03: 2 mg via ORAL
  Administered 2020-05-03: 1 mg via ORAL
  Filled 2020-05-03: qty 1
  Filled 2020-05-03: qty 2

## 2020-05-03 MED ORDER — PANTOPRAZOLE SODIUM 40 MG PO TBEC
40.0000 mg | DELAYED_RELEASE_TABLET | Freq: Every day | ORAL | Status: DC
  Administered 2020-05-03 – 2020-05-04 (×2): 40 mg via ORAL
  Filled 2020-05-03 (×2): qty 1

## 2020-05-03 MED ORDER — NICOTINE 21 MG/24HR TD PT24
21.0000 mg | MEDICATED_PATCH | Freq: Every day | TRANSDERMAL | Status: DC
  Administered 2020-05-03 – 2020-05-07 (×5): 21 mg via TRANSDERMAL
  Filled 2020-05-03 (×6): qty 1

## 2020-05-03 MED ORDER — POTASSIUM CHLORIDE 10 MEQ/100ML IV SOLN
10.0000 meq | INTRAVENOUS | Status: AC
  Administered 2020-05-03 (×5): 10 meq via INTRAVENOUS
  Filled 2020-05-03 (×5): qty 100

## 2020-05-03 MED ORDER — ENOXAPARIN SODIUM 40 MG/0.4ML ~~LOC~~ SOLN
40.0000 mg | SUBCUTANEOUS | Status: DC
  Filled 2020-05-03: qty 0.4

## 2020-05-03 MED ORDER — IPRATROPIUM-ALBUTEROL 0.5-2.5 (3) MG/3ML IN SOLN: 3.0000 mL | Freq: Four times a day (QID) | RESPIRATORY_TRACT | Status: DC | PRN

## 2020-05-03 MED ORDER — LORAZEPAM 2 MG/ML IJ SOLN: 0.0000 mg | Freq: Two times a day (BID) | INTRAMUSCULAR | Status: AC

## 2020-05-03 MED ORDER — PROMETHAZINE HCL 25 MG PO TABS: 12.5000 mg | ORAL_TABLET | Freq: Four times a day (QID) | ORAL | Status: DC | PRN

## 2020-05-03 MED ORDER — ASPIRIN EC 81 MG PO TBEC
81.0000 mg | DELAYED_RELEASE_TABLET | Freq: Every day | ORAL | Status: DC
  Administered 2020-05-03 – 2020-05-04 (×2): 81 mg via ORAL
  Filled 2020-05-03 (×2): qty 1

## 2020-05-03 MED ORDER — METOPROLOL TARTRATE 12.5 MG HALF TABLET
12.5000 mg | ORAL_TABLET | Freq: Two times a day (BID) | ORAL | Status: DC
  Administered 2020-05-03 – 2020-05-07 (×8): 12.5 mg via ORAL
  Filled 2020-05-03 (×9): qty 1

## 2020-05-03 MED ORDER — LORAZEPAM 1 MG PO TABS: 0.0000 mg | ORAL_TABLET | Freq: Two times a day (BID) | ORAL | Status: AC

## 2020-05-03 MED ORDER — LACTATED RINGERS IV SOLN: INTRAVENOUS | Status: DC

## 2020-05-03 MED ORDER — CHLORDIAZEPOXIDE HCL 25 MG PO CAPS
25.0000 mg | ORAL_CAPSULE | Freq: Three times a day (TID) | ORAL | Status: DC
  Administered 2020-05-03 – 2020-05-07 (×13): 25 mg via ORAL
  Filled 2020-05-03 (×13): qty 1

## 2020-05-03 MED ORDER — THIAMINE HCL 100 MG/ML IJ SOLN
100.0000 mg | Freq: Every day | INTRAMUSCULAR | Status: DC
  Filled 2020-05-03 (×2): qty 2

## 2020-05-03 MED ORDER — THIAMINE HCL 100 MG PO TABS
100.0000 mg | ORAL_TABLET | Freq: Every day | ORAL | Status: DC
  Administered 2020-05-05 – 2020-05-06 (×2): 100 mg via ORAL
  Filled 2020-05-03 (×5): qty 1

## 2020-05-03 NOTE — Progress Notes (Signed)
Attempted to call and get report, nurse busy, will call back

## 2020-05-03 NOTE — Consult Note (Addendum)
[facility] Face-to-Face Psychiatry Consult   Reason for Consult:  Schizoaffetcive Referring Physician:  Dr. Tana Coast Patient Identification: [name] MRN:  [id number] Principal Diagnosis: Syncope Diagnosis:  Principal Problem:   Syncope Active Problems:   Hypokalemia   Hyponatremia   Tobacco dependence due to cigarettes   Alcoholic hepatitis without ascites   Hypomagnesemia   Hyperbilirubinemia   D-dimer, elevated   Alcoholism ([place])   Prolonged QT interval   Total Time spent with patient: 30 minutes  Subjective:   [name] is a 49 y.o. male patient admitted with chronic alcohol use disorder, schizoaffective disorder, Htn, and syncope. Patient reports a history of schizoaffective, bipolar type. He has been self medicating with alcohol. He denies any current outpatient provider or therapy. He denies any previous or recent inpatient admissions. He denies any current suicidal thoughts or previous attempts. He does report a history of hallucinations visual and auditory, however denies any at this time. He declines requesting substance abuse treatment, however is open to outpatient rehab.   HPI: [name] is a 49 y.o. male with medical history significant for alcoholism, HTN, GERD, schizoaffective disorder who presents to emergency room by EMS after having a syncopal event at home.  Ports he remembers sitting at his kitchen table and then waking up on the floor.  He reports he has had seizures in the past with alcohol withdrawal.  Reports he drank 5  24 ounce cans of loco yesterday morning but then did not drink for the rest of the day.  He states he did not have confusion when he woke up and he knew where he was in who he was and does not member having any convulsions but he does state he has had seizures in the past and cannot say that he did not have one.  Alcohol level was elevated when he arrived in the emergency room at 144.  He complained of headache and neck pain and had CT of his  head and neck which were negative for acute injury or trauma.   Past Psychiatric History: See above  Risk to Self:   Denies Risk to Others:  Denies Prior Inpatient Therapy:  None recent Prior Outpatient Therapy:  Yes, non recently previously on zoloft and seroqeul, discontinued his medications one year ago. Is interested in resuming his medications.   Past Medical History:  Past Medical History:  Diagnosis Date  . Alcohol abuse   . Anxiety    at age 39  . Depression    at age 63  . GERD (gastroesophageal reflux disease)   . Hypertension   . Psychosis ([place])   . PTSD (post-traumatic stress disorder)   . Schizoaffective disorder   . Seizure disorder ([place])    related to etoh seizure    Past Surgical History:  Procedure Laterality Date  . FOOT SURGERY     right  . HAND SURGERY     Family History:  Family History  Problem Relation Age of Onset  . Alcoholism Mother   . CAD Father   . Depression Brother    Family Psychiatric  History: Denies Social History:  Social History   Substance and Sexual Activity  Alcohol Use Yes  . Alcohol/week: 0.0 standard drinks   Comment: drinking daily      Social History   Substance and Sexual Activity  Drug Use Yes  . Frequency: 1.0 times per week  . Types: Marijuana   Comment: THC 2 to 3 times per month. last use 09/24/2015  Social History   Socioeconomic History  . Marital status: Divorced    Spouse name: Not on file  . Number of children: Not on file  . Years of education: Not on file  . Highest education level: Not on file  Occupational History  . Not on file  Tobacco Use  . Smoking status: Current Every Day Smoker    Packs/day: 1.50    Years: 30.00    Pack years: 45.00    Types: Cigarettes  . Smokeless tobacco: Never Used  Vaping Use  . Vaping Use: Every day  Substance and Sexual Activity  . Alcohol use: Yes    Alcohol/week: 0.0 standard drinks    Comment: drinking daily   . Drug use: Yes    Frequency: 1.0  times per week    Types: Marijuana    Comment: THC 2 to 3 times per month. last use 09/24/2015  . Sexual activity: Yes    Birth control/protection: Condom  Other Topics Concern  . Not on file  Social History Narrative  . Not on file   Social Determinants of Health   Financial Resource Strain:   . Difficulty of Paying Living Expenses: Not on file  Food Insecurity: No Food Insecurity  . Worried About Charity fundraiser in the Last Year: Never true  . Ran Out of Food in the Last Year: Never true  Transportation Needs: Unmet Transportation Needs  . Lack of Transportation (Medical): Yes  . Lack of Transportation (Non-Medical): Yes  Physical Activity:   . Days of Exercise per Week: Not on file  . Minutes of Exercise per Session: Not on file  Stress:   . Feeling of Stress : Not on file  Social Connections:   . Frequency of Communication with Friends and Family: Not on file  . Frequency of Social Gatherings with Friends and Family: Not on file  . Attends Religious Services: Not on file  . Active Member of Clubs or Organizations: Not on file  . Attends Archivist Meetings: Not on file  . Marital Status: Not on file   Additional Social History:    Allergies:   Allergies  Allergen Reactions  . Wellbutrin [Bupropion] Other (See Comments)    Caused seizures    Labs:  Results for orders placed or performed during the hospital encounter of 05/02/20 (from the past 48 hour(s))  CBC with Differential/Platelet     Status: Abnormal   Collection Time: 05/03/20 12:05 AM  Result Value Ref Range   WBC 11.3 (H) 4.0 - 10.5 K/uL   RBC 2.52 (L) 4.22 - 5.81 MIL/uL   Hemoglobin 8.2 (L) 13.0 - 17.0 g/dL   HCT 23.5 (L) 39 - 52 %   MCV 93.3 80.0 - 100.0 fL   MCH 32.5 26.0 - 34.0 pg   MCHC 34.9 30.0 - 36.0 g/dL   RDW 23.0 (H) 11.5 - 15.5 %   Platelets 384 150 - 400 K/uL   nRBC 0.4 (H) 0.0 - 0.2 %   Neutrophils Relative % 72 %   Neutro Abs 8.1 (H) 1.7 - 7.7 K/uL   Lymphocytes  Relative 20 %   Lymphs Abs 2.3 0.7 - 4.0 K/uL   Monocytes Relative 6 %   Monocytes Absolute 0.7 0 - 1 K/uL   Eosinophils Relative 0 %   Eosinophils Absolute 0.0 0 - 0 K/uL   Basophils Relative 0 %   Basophils Absolute 0.0 0 - 0 K/uL   Immature Granulocytes 2 %  Abs Immature Granulocytes 0.21 (H) 0.00 - 0.07 K/uL    Comment: Performed at [facility], [place] [address] [place], [place] [address]  Basic metabolic panel     Status: Abnormal   Collection Time: 05/03/20 12:05 AM  Result Value Ref Range   Sodium 129 (L) 135 - 145 mmol/L   Potassium <2.0 (LL) 3.5 - 5.1 mmol/L    Comment: CRITICAL RESULT CALLED TO, READ BACK BY AND VERIFIED WITH: RN B BECK @0114  04/23/20 BY S GEZAHEGN    Chloride 73 (L) 98 - 111 mmol/L   CO2 35 (H) 22 - 32 mmol/L   Glucose, Bld 127 (H) 70 - 99 mg/dL    Comment: Glucose reference range applies only to samples taken after fasting for at least 8 hours.   BUN 6 6 - 20 mg/dL   Creatinine, Ser 0.72 0.61 - 1.24 mg/dL   Calcium 7.6 (L) 8.9 - 10.3 mg/dL   GFR calc non Af Amer >60 >60 mL/min   GFR calc Af Amer >60 >60 mL/min   Anion gap 21 (H) 5 - 15    Comment: Performed at [facility] [address], [place], [place] [address]  Hepatic function panel     Status: Abnormal   Collection Time: 05/03/20 12:05 AM  Result Value Ref Range   Total Protein 5.4 (L) 6.5 - 8.1 g/dL   Albumin 1.9 (L) 3.5 - 5.0 g/dL   AST 233 (H) 15 - 41 U/L   ALT 94 (H) 0 - 44 U/L   Alkaline Phosphatase 152 (H) 38 - 126 U/L   Total Bilirubin 7.6 (H) 0.3 - 1.2 mg/dL   Bilirubin, Direct 4.2 (H) 0.0 - 0.2 mg/dL   Indirect Bilirubin 3.4 (H) 0.3 - 0.9 mg/dL    Comment: Performed at [facility] [address], [place], [place] [address]  Troponin I (High Sensitivity)     Status: None   Collection Time: 05/03/20 12:05 AM  Result Value Ref Range   Troponin I (High Sensitivity) 14 <18 ng/L    Comment: (NOTE) Elevated high sensitivity troponin I (hsTnI) values and  significant  changes across serial measurements may suggest ACS but many other  chronic and acute conditions are known to elevate hsTnI results.  Refer to the Links section for chest pain algorithms and additional  guidance. Performed at [facility], [place] [address] [place], [place] [address]   Brain natriuretic peptide     Status: Abnormal   Collection Time: 05/03/20 12:05 AM  Result Value Ref Range   B Natriuretic Peptide 128.7 (H) 0.0 - 100.0 pg/mL    Comment: Performed at [facility] [address] [place], [place] [address]  Ethanol     Status: Abnormal   Collection Time: 05/03/20 12:05 AM  Result Value Ref Range   Alcohol, Ethyl (B) 144 (H) <10 mg/dL    Comment: (NOTE) Lowest detectable limit for serum alcohol is 10 mg/dL.  For medical purposes only. Performed at [facility], [place] [address] [place], [place] [address]   Ammonia     Status: Abnormal   Collection Time: 05/03/20 12:05 AM  Result Value Ref Range   Ammonia 46 (H) 9 - 35 umol/L    Comment: Performed at [facility], [place] [address] [place],  [address]  D-dimer, quantitative (not at [facility])     Status: Abnormal   Collection Time: 05/03/20 12:05 AM  Result Value  Ref Range   D-Dimer, Quant 0.68 (H) 0.00 - 0.50 ug/mL-FEU    Comment: (NOTE) At the manufacturer cut-off of 0.50 ug/mL FEU, this assay has been documented to exclude PE with a sensitivity and negative predictive value of 97 to 99%.  At this time, this assay has not been approved by the FDA to exclude DVT/VTE. Results should be correlated with clinical presentation. Performed at [facility], [place] [address], [place], [place] [address]   Magnesium     Status: Abnormal   Collection Time: 05/03/20 12:05 AM  Result Value Ref Range   Magnesium 1.4 (L) 1.7 - 2.4 mg/dL    Comment: Performed at [facility] [address] [place], [place] [address]  SARS Coronavirus 2 by RT PCR (hospital order, performed in  [facility] hospital lab) Nasopharyngeal Nasopharyngeal Swab     Status: None   Collection Time: 05/03/20 12:23 AM   Specimen: Nasopharyngeal Swab  Result Value Ref Range   SARS Coronavirus 2 NEGATIVE NEGATIVE    Comment: (NOTE) SARS-CoV-2 target nucleic acids are NOT DETECTED.  The SARS-CoV-2 RNA is generally detectable in upper and lower respiratory specimens during the acute phase of infection. The lowest concentration of SARS-CoV-2 viral copies this assay can detect is 250 copies / mL. A negative result does not preclude SARS-CoV-2 infection and should not be used as the sole basis for treatment or other patient management decisions.  A negative result may occur with improper specimen collection / handling, submission of specimen other than nasopharyngeal swab, presence of viral mutation(s) within the areas targeted by this assay, and inadequate number of viral copies (<250 copies / mL). A negative result must be combined with clinical observations, patient history, and epidemiological information.  Fact Sheet for Patients:   StrictlyIdeas.no  Fact Sheet for Healthcare Providers: BankingDealers.co.za  This test is not yet approved or  cleared by the [place] FDA and has been authorized for detection and/or diagnosis of SARS-CoV-2 by FDA under an Emergency Use Authorization (EUA).  This EUA will remain in effect (meaning this test can be used) for the duration of the COVID-19 declaration under Section 564(b)(1) of the Act, 21 U.S.C. section 360bbb-3(b)(1), unless the authorization is terminated or revoked sooner.  Performed at [facility], [place] [address] [place],  [address]   Troponin I (High Sensitivity)     Status: Abnormal   Collection Time: 05/03/20  4:20 AM  Result Value Ref Range   Troponin I (High Sensitivity) 18 (H) <18 ng/L    Comment: (NOTE) Elevated high sensitivity troponin I (hsTnI) values and  significant  changes across serial measurements may suggest ACS but many other  chronic and acute conditions are known to elevate hsTnI results.  Refer to the "Links" section for chest pain algorithms and additional  guidance. Performed at [facility], [place] [address] [place], [place] [address]   HIV Antibody (routine testing w rflx)     Status: None   Collection Time: 05/03/20  4:20 AM  Result Value Ref Range   HIV Screen 4th Generation wRfx Non Reactive Non Reactive    Comment: Performed at [facility], [place] [address] [place] [address]  CBC     Status: Abnormal   Collection Time: 05/03/20  4:20 AM  Result Value Ref Range   WBC 10.2 4.0 - 10.5 K/uL   RBC 2.24 (L) 4.22 - 5.81 MIL/uL   Hemoglobin 7.4 (L) 13.0 -  17.0 g/dL   HCT 20.9 (L) 39 - 52 %   MCV 93.3 80.0 - 100.0 fL   MCH 33.0 26.0 - 34.0 pg   MCHC 35.4 30.0 - 36.0 g/dL   RDW 23.1 (H) 11.5 - 15.5 %   Platelets 341 150 - 400 K/uL   nRBC 0.2 0.0 - 0.2 %    Comment: Performed at [facility] [address] [place], [place] [address]  Comprehensive metabolic panel     Status: Abnormal   Collection Time: 05/03/20  4:20 AM  Result Value Ref Range   Sodium 131 (L) 135 - 145 mmol/L   Potassium 4.1 3.5 - 5.1 mmol/L   Chloride 77 (L) 98 - 111 mmol/L   CO2 35 (H) 22 - 32 mmol/L   Glucose, Bld 121 (H) 70 - 99 mg/dL    Comment: Glucose reference range applies only to samples taken after fasting for at least 8 hours.   BUN 6 6 - 20 mg/dL   Creatinine, Ser 0.62 0.61 - 1.24 mg/dL   Calcium 7.4 (L) 8.9 - 10.3 mg/dL   Total Protein 5.0 (L) 6.5 - 8.1 g/dL   Albumin 1.8 (L) 3.5 - 5.0 g/dL   AST 192 (H) 15 - 41 U/L   ALT 80 (H) 0 - 44 U/L   Alkaline Phosphatase 144 (H) 38 - 126 U/L   Total Bilirubin 6.7 (H) 0.3 - 1.2 mg/dL   GFR calc non Af Amer >60 >60 mL/min   GFR calc Af Amer >60 >60 mL/min   Anion gap 19 (H) 5 - 15    Comment: Performed at [facility] [address], [place], [place]  [address]  Rapid urine drug screen (hospital performed)     Status: Abnormal   Collection Time: 05/03/20  7:33 AM  Result Value Ref Range   Opiates NONE DETECTED NONE DETECTED   Cocaine NONE DETECTED NONE DETECTED   Benzodiazepines NONE DETECTED NONE DETECTED   Amphetamines NONE DETECTED NONE DETECTED   Tetrahydrocannabinol POSITIVE (A) NONE DETECTED   Barbiturates NONE DETECTED NONE DETECTED    Comment: (NOTE) DRUG SCREEN FOR MEDICAL PURPOSES ONLY.  IF CONFIRMATION IS NEEDED FOR ANY PURPOSE, NOTIFY LAB WITHIN 5 DAYS.  LOWEST DETECTABLE LIMITS FOR URINE DRUG SCREEN Drug Class                     Cutoff (ng/mL) Amphetamine and metabolites    1000 Barbiturate and metabolites    200 Benzodiazepine                 258 Tricyclics and metabolites     300 Opiates and metabolites        300 Cocaine and metabolites        300 THC                            50 Performed at [facility], [place] [address], [place], [place] [address]   Vitamin B12     Status: None   Collection Time: 05/03/20  7:33 AM  Result Value Ref Range   Vitamin B-12 612 180 - 914 pg/mL    Comment: (NOTE) This assay is not validated for testing neonatal or myeloproliferative syndrome specimens for Vitamin B12 levels. Performed at [facility], [place] [address] [place], [place] [address]   Folate     Status: Abnormal   Collection Time:  05/03/20  7:33 AM  Result Value Ref Range   Folate 2.9 (L) >5.9 ng/mL    Comment: Performed at [facility] [address], [place], [place] [address]  Iron and TIBC     Status: Abnormal   Collection Time: 05/03/20  7:33 AM  Result Value Ref Range   Iron 122 45 - 182 ug/dL   TIBC 129 (L) 250 - 450 ug/dL   Saturation Ratios 95 (H) 17.9 - 39.5 %   UIBC 7 ug/dL    Comment: Performed at [facility], [place] [address], [place], [place] [address]  Ferritin     Status: Abnormal   Collection Time: 05/03/20  7:33 AM  Result Value Ref Range   Ferritin 2,307 (H) 24 -  336 ng/mL    Comment: Performed at [facility], [place] [address], [place], [place] [address]  Reticulocytes     Status: Abnormal   Collection Time: 05/03/20  7:33 AM  Result Value Ref Range   Retic Ct Pct 0.7 0.4 - 3.1 %   RBC. 2.26 (L) 4.22 - 5.81 MIL/uL   Retic Count, Absolute 15.4 (L) 19.0 - 186.0 K/uL   Immature Retic Fract 22.3 (H) 2.3 - 15.9 %    Comment: Performed at [facility] [address], [place], [place] [address]  Osmolality     Status: None   Collection Time: 05/03/20  9:18 AM  Result Value Ref Range   Osmolality 289 275 - 295 mOsm/kg    Comment: Performed at [facility], [place] [address], [place], [place] [address]  Osmolality, urine     Status: None   Collection Time: 05/03/20  9:18 AM  Result Value Ref Range   Osmolality, Ur 422 300 - 900 mOsm/kg    Comment: Performed at [facility] [address], [place], [place] [address]  Sodium, urine, random     Status: None   Collection Time: 05/03/20  9:18 AM  Result Value Ref Range   Sodium, Ur <10 mmol/L    Comment: RESULT REPEATED AND VERIFIED Performed at [facility] [address] [place], [place] [address]   TSH     Status: None   Collection Time: 05/03/20  9:18 AM  Result Value Ref Range   TSH 0.987 0.350 - 4.500 uIU/mL    Comment: Performed by a 3rd Generation assay with a functional sensitivity of <=0.01 uIU/mL. Performed at [facility], [place] [address] [place], [place] [address]     Current Facility-Administered Medications  Medication Dose Route Frequency Provider Last Rate Last Admin  . amLODipine (NORVASC) tablet 10 mg  10 mg Oral Daily Chotiner, Yevonne Aline, MD   10 mg at 05/03/20 1049  . aspirin EC tablet 81 mg  81 mg Oral Daily Chotiner, Yevonne Aline, MD   81 mg at 05/03/20 1049  . chlordiazePOXIDE (LIBRIUM) capsule 25 mg  25 mg Oral TID Chotiner, Yevonne Aline, MD   25 mg at 05/03/20 1049  . folic acid (FOLVITE) tablet 1 mg  1 mg Oral Daily Chotiner, Yevonne Aline, MD    1 mg at 05/03/20 1055  . ipratropium-albuterol (DUONEB) 0.5-2.5 (3) MG/3ML nebulizer solution 3 mL  3 mL Nebulization Q6H PRN Chotiner, Yevonne Aline, MD      . lactated ringers infusion   Intravenous Continuous Chotiner, Yevonne Aline, MD 125 mL/hr at 05/03/20 0627 New Bag at 05/03/20 0272  . LORazepam (ATIVAN) injection  0-4 mg  0-4 mg Intravenous Q6H Chotiner, Yevonne Aline, MD   2 mg at 05/03/20 0749   Or  . LORazepam (ATIVAN) tablet 0-4 mg  0-4 mg Oral Q6H Chotiner, Yevonne Aline, MD      . Derrill Memo ON 05/05/2020] LORazepam (ATIVAN) injection 0-4 mg  0-4 mg Intravenous Q12H Chotiner, Yevonne Aline, MD       Or  . Derrill Memo ON 05/05/2020] LORazepam (ATIVAN) tablet 0-4 mg  0-4 mg Oral Q12H Chotiner, Yevonne Aline, MD      . metoprolol tartrate (LOPRESSOR) tablet 12.5 mg  12.5 mg Oral BID Chotiner, Yevonne Aline, MD   12.5 mg at 05/03/20 1049  . multivitamin with minerals tablet 1 tablet  1 tablet Oral Daily Chotiner, Yevonne Aline, MD   1 tablet at 05/03/20 1049  . nicotine (NICODERM CQ - dosed in mg/24 hours) patch 21 mg  21 mg Transdermal Daily Chotiner, Yevonne Aline, MD   21 mg at 05/03/20 0918  . pantoprazole (PROTONIX) EC tablet 40 mg  40 mg Oral Daily Chotiner, Yevonne Aline, MD   40 mg at 05/03/20 1049  . promethazine (PHENERGAN) tablet 12.5 mg  12.5 mg Oral Q6H PRN Chotiner, Yevonne Aline, MD      . thiamine tablet 100 mg  100 mg Oral Daily Pollina, Gwenyth Allegra, MD       Or  . thiamine (B-1) injection 100 mg  100 mg Intravenous Daily Pollina, Gwenyth Allegra, MD      . thiamine tablet 100 mg  100 mg Oral Daily Chotiner, Yevonne Aline, MD   100 mg at 05/03/20 1048   Current Outpatient Medications  Medication Sig Dispense Refill  . allopurinol (ZYLOPRIM) 100 MG tablet Take 2 tablets (200 mg total) by mouth daily. 180 tablet 0  . amLODipine (NORVASC) 10 MG tablet Take 1 tablet (10 mg total) by mouth daily. 90 tablet 0  . loperamide (IMODIUM) 2 MG capsule Take 4 mg by mouth as needed for diarrhea or loose stools.    . metoprolol tartrate  (LOPRESSOR) 25 MG tablet Take 0.5 tablets (12.5 mg total) by mouth 2 (two) times daily. 90 tablet 0  . MILK THISTLE PO Take 1 capsule by mouth daily.    . Misc. Devices MISC Blood Pressure monitor. Dx :Hypertension 1 each 0  . pantoprazole (PROTONIX) 40 MG tablet Take 1 tablet (40 mg total) by mouth daily. 90 tablet 0  . chlordiazePOXIDE (LIBRIUM) 25 MG capsule 50mg  PO TID x 1D, then 25-50mg  PO BID X 1D, then 25-50mg  PO QD X 1D (Patient not taking: Reported on 05/03/2020) 15 capsule 0  . feeding supplement, ENSURE ENLIVE, (ENSURE ENLIVE) LIQD Take 237 mLs by mouth 2 (two) times daily between meals. (Patient not taking: Reported on 10/18/2019) 373 mL 12  . folic acid (FOLVITE) 1 MG tablet Take 1 tablet (1 mg total) by mouth daily. (Patient not taking: Reported on 05/03/2020) 30 tablet 0  . ondansetron (ZOFRAN ODT) 8 MG disintegrating tablet Take 1 tablet (8 mg total) by mouth every 8 (eight) hours as needed for nausea or vomiting. (Patient not taking: Reported on 05/03/2020) 12 tablet 0    Musculoskeletal: Strength & Muscle Tone: within normal limits Gait & Station: normal Patient leans: N/A  Psychiatric Specialty Exam: Physical Exam  Review of Systems  Blood pressure 118/62, pulse 98, temperature 98.4 F (36.9 C), temperature source Oral, resp. rate (!) 23, SpO2 91 %.There is no height or weight on file to calculate BMI.  General Appearance:  Fairly Groomed  Eye Contact:  Fair  Speech:  Clear and Coherent and Normal Rate  Volume:  Normal  Mood:  Depressed  Affect:  Congruent  Thought Process:  Coherent, Linear and Descriptions of Associations: Intact  Orientation:  Full (Time, Place, and Person)  Thought Content:  Logical  Suicidal Thoughts:  No  Homicidal Thoughts:  No  Memory:  Immediate;   Fair Recent;   Fair  Judgement:  Fair  Insight:  Fair  Psychomotor Activity:  Normal  Concentration:  Concentration: Fair and Attention Span: Fair  Recall:  [employer] of Knowledge:  Fair   Language:  Fair  Akathisia:  No  Handed:  Right  AIMS (if indicated):     Assets:  Communication Skills Desire for Improvement Financial Resources/Insurance Leisure Time Physical Health Social Support  ADL's:  Intact  Cognition:  WNL  Sleep:        Treatment Plan Summary: Plan Psych cleared. Patient is open to starting his medications both zoloft and seroqeul. At this time he is having vomiting and an increased amount of nausea. WIll defer starting zoloft at this time due to worsening n/v with initiation. Once these symptoms have resolved will recommend starting zoloft 25mg  po daily. Will start seroquel 50mg  po qhs. WIll recommed placing referral for CDIOP as he is open to therapy. He did verbalize that he does not which to discuss alcohol cessation at this time, and has no current motivation to quit.  -We can not resume any medications at this time due to his QTc prolongation. will recommend starting seroquel 50mg  po qhs once potassium and magnesium, QTc prolongation are corrected. Most psychotropics place patient at risk for torsades. He has multiple risk factors for an event. EKG obtained QTc 601.  Will recommend starting zoloft 25mg  po daily once n/v symptoms reside or resolved.  - will recommend working closely with social work once patient has been medically cleared for [place] with [employer].  -will place peer support consult as well.  -Psych to sign off.   Disposition: No evidence of imminent risk to self or others at present.   Patient does not meet criteria for psychiatric inpatient admission. Supportive therapy provided about ongoing stressors. Refer to IOP. Refer to [facility] with [facility].   Suella [place], FNP 05/03/2020 12:27 PM

## 2020-05-03 NOTE — ED Notes (Signed)
Attempted to call report to [facility]- was told RN is discharging pt upstairs and will call me back shortly

## 2020-05-03 NOTE — ED Provider Notes (Addendum)
[facility] EMERGENCY DEPARTMENT Provider Note   CSN: [id number] Arrival date & time: 05/02/20  2347     History No chief complaint on file.   [name] is a 49 y.o. male.  Patient presents by ambulance from home after a syncopal episode which he thinks might have been a seizure.  Patient reports that he is an alcoholic and has had withdrawal seizures in the past.  He has had decreased alcohol intake recently, last drink was early this morning.  He feels like he is going through withdrawals.  Patient complaining of neck pain.  No numbness, tingling or weakness of extremities.  He has noticed swelling of his left leg over the last 2 weeks.  No trauma noted.  He has had similar swelling in the past related to his cirrhosis.  No chest pain or shortness of breath.        Past Medical History:  Diagnosis Date  . Alcohol abuse   . Anxiety    at age 62  . Depression    at age 26  . Psychosis ([place])   . PTSD (post-traumatic stress disorder)   . Schizoaffective disorder   . Seizure disorder ([place])    related to etoh seizure    Patient Active Problem List   Diagnosis Date Noted  . Rhabdomyolysis 03/06/2019  . Alcoholic hepatitis without ascites 03/06/2019  . Alcohol withdrawal ([place]) 01/15/2019  . HTN (hypertension) 01/10/2019  . Fall 01/10/2019  . Alcohol intoxication ([place]) 01/10/2019  . Schizoaffective disorder, bipolar type ([place]) 06/02/2018  . Alcohol dependence with uncomplicated withdrawal ([place]) 02/22/2016  . Alcohol-induced mood disorder ([place]) 02/22/2016  . Gout 10/22/2015  . GERD (gastroesophageal reflux disease) 10/22/2015  . Alcohol withdrawal seizure with complication ([place]) 07/62/2633  . Schizoaffective disorder, depressive type ([place])   . History of alcohol abuse   . Tobacco dependence due to cigarettes 11/20/2014  . Alcohol abuse with intoxication ([place])   . Hyponatremia 10/18/2014  . Abnormal LFTs   . Alcohol abuse 02/09/2014  . Post  traumatic stress disorder (PTSD) 11/03/2011  . Hypokalemia 07/02/2011    Past Surgical History:  Procedure Laterality Date  . FOOT SURGERY     right  . HAND SURGERY         Family History  Problem Relation Age of Onset  . Alcoholism Mother   . CAD Father   . Depression Brother     Social History   Tobacco Use  . Smoking status: Current Every Day Smoker    Packs/day: 1.00    Years: 24.00    Pack years: 24.00    Types: Cigarettes  . Smokeless tobacco: Never Used  Vaping Use  . Vaping Use: Every day  Substance Use Topics  . Alcohol use: Yes    Alcohol/week: 0.0 standard drinks    Comment: drinking daily   . Drug use: Yes    Frequency: 1.0 times per week    Types: Marijuana    Comment: THC 2 to 3 times per month. last use 09/24/2015    Home Medications Prior to Admission medications   Medication Sig Start Date End Date Taking? Authorizing Provider  allopurinol (ZYLOPRIM) 100 MG tablet Take 2 tablets (200 mg total) by mouth daily. 04/20/20   Nicolette Bang, DO  amLODipine (NORVASC) 10 MG tablet Take 1 tablet (10 mg total) by mouth daily. 04/20/20   Nicolette Bang, DO  chlordiazePOXIDE (LIBRIUM) 25 MG capsule 50mg  PO TID x 1D, then 25-50mg  PO  BID X 1D, then 25-50mg  PO QD X 1D 11/23/19   Dorie Rank, MD  feeding supplement, ENSURE ENLIVE, (ENSURE ENLIVE) LIQD Take 237 mLs by mouth 2 (two) times daily between meals. Patient not taking: Reported on 10/18/2019 10/07/19   Madalyn Rob, MD  folic acid (FOLVITE) 1 MG tablet Take 1 tablet (1 mg total) by mouth daily. 11/23/19   Dorie Rank, MD  loperamide (IMODIUM) 2 MG capsule Take 4 mg by mouth as needed for diarrhea or loose stools.    [provider]  metoprolol tartrate (LOPRESSOR) 25 MG tablet Take 0.5 tablets (12.5 mg total) by mouth 2 (two) times daily. 04/20/20   Nicolette Bang, DO  Misc. Devices MISC Blood Pressure monitor. Dx :Hypertension 06/09/19   [name], MD  ondansetron (ZOFRAN  ODT) 8 MG disintegrating tablet Take 1 tablet (8 mg total) by mouth every 8 (eight) hours as needed for nausea or vomiting. 11/23/19   Dorie Rank, MD  pantoprazole (PROTONIX) 40 MG tablet Take 1 tablet (40 mg total) by mouth daily. 04/20/20   Nicolette Bang, DO    Allergies    Wellbutrin [bupropion]  Review of Systems   Review of Systems  Cardiovascular: Positive for leg swelling.  Neurological: Positive for seizures.  All other systems reviewed and are negative.   Physical Exam Updated Vital Signs BP 117/68   Pulse 90   Temp 98.3 F (36.8 C) (Oral)   Resp (!) 25   SpO2 95%   Physical Exam Vitals and nursing note reviewed.  Constitutional:      General: He is not in acute distress.    Appearance: Normal appearance. He is well-developed.  HENT:     Head: Normocephalic and atraumatic.     Right Ear: Hearing normal.     Left Ear: Hearing normal.     Nose: Nose normal.  Eyes:     Conjunctiva/sclera: Conjunctivae normal.     Pupils: Pupils are equal, round, and reactive to light.  Cardiovascular:     Rate and Rhythm: Regular rhythm.     Heart sounds: S1 normal and S2 normal. No murmur heard.  No friction rub. No gallop.   Pulmonary:     Effort: Pulmonary effort is normal. No respiratory distress.     Breath sounds: Normal breath sounds.  Chest:     Chest wall: No tenderness.  Abdominal:     General: Bowel sounds are normal.     Palpations: Abdomen is soft.     Tenderness: There is no abdominal tenderness. There is no guarding or rebound. Negative signs include Murphy's sign and McBurney's sign.     Hernia: No hernia is present.  Musculoskeletal:        General: Normal range of motion.     Cervical back: Normal range of motion and neck supple. Tenderness present.     Right lower leg: Edema present.     Left lower leg: Edema (left>>R) present.  Skin:    General: Skin is warm and dry.     Findings: No rash.  Neurological:     Mental Status: He is alert and  oriented to person, place, and time.     GCS: GCS eye subscore is 4. GCS verbal subscore is 5. GCS motor subscore is 6.     Cranial Nerves: No cranial nerve deficit.     Sensory: No sensory deficit.     Coordination: Coordination normal.  Psychiatric:        Speech: Speech  normal.        Behavior: Behavior normal.        Thought Content: Thought content normal.     ED Results / Procedures / Treatments   Labs (all labs ordered are listed, but only abnormal results are displayed) Labs Reviewed  CBC WITH DIFFERENTIAL/PLATELET - Abnormal; Notable for the following components:      Result Value   WBC 11.3 (*)    RBC 2.52 (*)    Hemoglobin 8.2 (*)    HCT 23.5 (*)    RDW 23.0 (*)    nRBC 0.4 (*)    Neutro Abs 8.1 (*)    Abs Immature Granulocytes 0.21 (*)    All other components within normal limits  BASIC METABOLIC PANEL - Abnormal; Notable for the following components:   Sodium 129 (*)    Potassium <2.0 (*)    Chloride 73 (*)    CO2 35 (*)    Glucose, Bld 127 (*)    Calcium 7.6 (*)    Anion gap 21 (*)    All other components within normal limits  HEPATIC FUNCTION PANEL - Abnormal; Notable for the following components:   Total Protein 5.4 (*)    Albumin 1.9 (*)    AST 233 (*)    ALT 94 (*)    Alkaline Phosphatase 152 (*)    Total Bilirubin 7.6 (*)    Bilirubin, Direct 4.2 (*)    Indirect Bilirubin 3.4 (*)    All other components within normal limits  ETHANOL - Abnormal; Notable for the following components:   Alcohol, Ethyl (B) 144 (*)    All other components within normal limits  D-DIMER, QUANTITATIVE (NOT AT [facility]) - Abnormal; Notable for the following components:   D-Dimer, Quant 0.68 (*)    All other components within normal limits  MAGNESIUM - Abnormal; Notable for the following components:   Magnesium 1.4 (*)    All other components within normal limits  SARS CORONAVIRUS 2 BY RT PCR (HOSPITAL ORDER, [facility])  BRAIN NATRIURETIC  PEPTIDE  AMMONIA  RAPID URINE DRUG SCREEN, HOSP PERFORMED  TROPONIN I (HIGH SENSITIVITY)    EKG EKG Interpretation  Date/Time:  Wednesday May 02 2020 23:52:36 EDT Ventricular Rate:  91 PR Interval:    QRS Duration: 96 QT Interval:  488 QTC Calculation: 601 R Axis:   80 Text Interpretation: Sinus rhythm Borderline ST depression, diffuse leads Prolonged QT interval Confirmed by Orpah Greek [phone]) on 05/03/2020 12:00:50 AM   Radiology DG Chest Port 1 View  Result Date: 05/03/2020 CLINICAL DATA:  Edema possible seizure short of breath EXAM: PORTABLE CHEST 1 VIEW COMPARISON:  10/05/2019 FINDINGS: The heart size and mediastinal contours are within normal limits. Both lungs are clear. The visualized skeletal structures are unremarkable. IMPRESSION: No active disease. Electronically Signed   By: Donavan Foil M.D.   On: 05/03/2020 00:25    Procedures Procedures (including critical care time)  Medications Ordered in ED Medications  LORazepam (ATIVAN) injection 0-4 mg (1 mg Intravenous Given 05/03/20 0034)    Or  LORazepam (ATIVAN) tablet 0-4 mg ( Oral See Alternative 05/03/20 0034)  LORazepam (ATIVAN) injection 0-4 mg (has no administration in time range)    Or  LORazepam (ATIVAN) tablet 0-4 mg (has no administration in time range)  thiamine tablet 100 mg (has no administration in time range)    Or  thiamine (B-1) injection 100 mg (has no administration in time range)  magnesium  sulfate IVPB 2 g 50 mL (has no administration in time range)  potassium chloride 10 mEq in 100 mL IVPB (has no administration in time range)    ED Course  I have reviewed the triage vital signs and the nursing notes.  Pertinent labs & imaging results that were available during my care of the patient were reviewed by me and considered in my medical decision making (see chart for details).    MDM Rules/Calculators/A&P                          Patient presents to the emergency department  after syncopal episode.  Patient reports that he was sitting in a chair and then woke up on the ground, is not sure what happened.  He does report that he has been experiencing alcohol withdrawal symptoms today as he has not had a drink since early this morning.  He does have a history of alcohol withdrawal seizures.  Patient complaining of neck pain at arrival.  CT head and cervical spine performed.  Lab work shows significant hypokalemia and hypomagnesemia.  EKG shows significant QT prolongation.  Patient administered magnesium and IV potassium.  Patient complaining of left leg swelling.  He does have pitting edema of both legs, left much more than the right.  He has a history of cirrhosis and reports that his left leg always swells more than the right.  He is not experiencing any chest pain, shortness of breath, hypoxia.  Do not suspect PE.  D-dimer was mildly elevated, can have venous duplex in the morning.  Blood work also shows anion gap acidosis.  This is consistent with mild alcoholic ketoacidosis.  Patient will require hospitalization for further management.  Patient placed on CIWA protocol for alcohol withdrawal symptoms.  CRITICAL CARE Performed by: Orpah Greek   Total critical care time: 35 minutes  Critical care time was exclusive of separately billable procedures and treating other patients.  Critical care was necessary to treat or prevent imminent or life-threatening deterioration.  Critical care was time spent personally by me on the following activities: development of treatment plan with patient and/or surrogate as well as nursing, discussions with consultants, evaluation of patient's response to treatment, examination of patient, obtaining history from patient or surrogate, ordering and performing treatments and interventions, ordering and review of laboratory studies, ordering and review of radiographic studies, pulse oximetry and re-evaluation of patient's  condition.  Final Clinical Impression(s) / ED Diagnoses Final diagnoses:  Syncope, unspecified syncope type  Hypokalemia  Hypomagnesemia  Alcoholic ketoacidosis    Rx / DC Orders ED Discharge Orders    None       Sola Margolis, Gwenyth Allegra, MD 05/03/20 0768    Orpah Greek, MD 05/03/20 0881    Orpah Greek, MD 05/03/20 0120

## 2020-05-03 NOTE — ED Notes (Signed)
Patient transported to CT 

## 2020-05-03 NOTE — Procedures (Signed)
Patient Name: [name]  MRN: [id number]  Epilepsy Attending: Lora Havens  Referring Physician/Provider: Dr Harrold Donath Date: 05/03/2020  Duration: 24.37 mins  Patient history: 49yo m with syncope. EEG to evaluate for seizure.  Level of alertness: Awake, asleep  AEDs during EEG study: Lorazepam  Technical aspects: This EEG study was done with scalp electrodes positioned according to the 10-20 International system of electrode placement. Electrical activity was acquired at a sampling rate of 500Hz  and reviewed with a high frequency filter of 70Hz  and a low frequency filter of 1Hz . EEG data were recorded continuously and digitally stored.   Description: No posterior dominant rhythm was seen. Sleep was characterized by vertex waves, sleep spindles (12 to 14 Hz), maximal frontocentral region.  There is an excessive amount of 15 to 18 Hz beta activity with irregular morphology distributed symmetrically and diffusely. Physiologic photic driving was seen during photic stimulation.  Hyperventilation was not performed.     ABNORMALITY -Excessive beta, generalized  IMPRESSION: This study is within normal limits. No seizures or epileptiform discharges were seen throughout the recording. The excessive beta activity seen in the background is most likely due to the effect of benzodiazepine and is a benign EEG pattern.   Hannibal Skalla [name]

## 2020-05-03 NOTE — Progress Notes (Signed)
[facility]                                                                              Patient Demographics  [name], is a 49 y.o. male, DOB - August 02, 1971, HFW:[id number]  Admit date - 05/02/2020   Admitting Physician Eben Burow, MD  Outpatient Primary MD for the patient is Nicolette Bang, DO  Outpatient specialists:   LOS - 0  days   Medical records reviewed and are as summarized below:    No chief complaint on file.      Brief summary   Patient is a 49 year old male with history of alcoholism, hypertension, GERD, schizoaffective disorder, presented to ED via EMS after having a syncopal episode at home.  Patient remembers sitting at his kitchen table and waking up on the floor.  Patient reports he has had seizures in the past with alcohol withdrawal.  Patient has been drinking heavily, drank five 24 ounces cans of loco the day before the admission.  Alcohol level was elevated, 144.  Patient complained of headache, neck pain.  CT head and neck was negative for any injury. Patient reports diagnosed in the past 3 months with liver disease, has not had any specific therapy.  He continues to drink heavily every day, not had much to eat in the last 2 days secondary to his drinking.  Reports one episode of vomiting but no hematemesis.  Denied any abdominal pain.  Denies any hematochezia.  Smokes 1/2 packs of cigarettes for past 30 years.  Reports smoking marijuana occasionally.  Assessment & Plan    Principal Problem:   Syncope -Multifactorial, could be related to alcohol intoxication, vasovagal, severe electrolyte derangements.  Did not remember having tremors or urinary or bowel incontinence, no confusion on waking up, unwitnessed. -CT angiogram chest was negative for PE, follow EEG -CT head and neck negative for acute abnormality -Continue telemetry, rule out any arrhythmias.  Had an echo in 09/2019 showed EF of 60 to 02%, grade 1 diastolic  dysfunction.  Troponin 14->18, no chest pain -PT evaluation  Active Problems: Alcoholic liver disease, alcoholic hepatitis -Ultrasound abdomen to assess for ascites, -Counseled strongly on alcohol cessation -Elevated LFTs although improving from April 2021, follow abdominal ultrasound to assess for ascites -Bilirubin trending down, 7.6 at the time of admission, trending down to 6.7  - follow abdominal ultrasound -Currently alert and oriented, no signs of encephalopathy -Lower extremity pitting edema left >right, rule out DVT  Alcohol abuse -Continue CIWA protocol with Ativan, currently somewhat tremulous -Has history of seizures in the past due to withdrawals, follow closely -Continue thiamine, folate  Hyponatremia, metabolic alkalosis -Possibly due to beer potomania  -Obtain serum osmolality, urine osmolality, UNA, TSH -Sodium 129 at the time of admission, improving to 131  Severe hypokalemia, hypomagnesemia -Likely due to alcoholism, potassium less than 2 at the time of admission, magnesium 1.4 -Replaced, potassium 4.1  Anemia, normocytic, folic acid deficiency -Follow anemia panel, FOBT, folate 2.9 -Continue folic acid 1 mg daily -Hemoglobin 7.4, 11.9 in April 2021 -8.2 at the time of admission, did receive IV fluid hydration, may have a component of  hemodilution.  Denies any hematochezia, hematemesis. -Discussed with GI if FOBT positive     Tobacco dependence due to cigarettes -Placed on nicotine patch    Prolonged QT interval -Continue telemetry, avoid QT prolonging meds -Potassium, magnesium replaced   History of schizoaffective disorder -Not on any meds outpatient per med reconciliation, states he has been drinking alcohol to self treat his schizoaffective disorder. -Wants to see psychiatry, will consult  Obesity Estimated body mass index is 29.84 kg/m as calculated from the following:   Height as of 11/23/19: 6' (1.829 m).   Weight as of 11/23/19: 99.8  kg.  Code Status: Full code DVT Prophylaxis SCD's Family Communication: Discussed all imaging results, lab results, explained to the patient   Disposition Plan:     Status is: Inpatient  Remains inpatient appropriate because:Inpatient level of care appropriate due to severity of illness   Dispo: The patient is from: Home              Anticipated d/c is to: Home              Anticipated d/c date is: 2 days              Patient currently is not medically stable to d/c.      Time Spent in minutes 35 minutes, chart review, examination and discussing the plan with the patient  Procedures:  CT angiogram chest Consultants:   None  Antimicrobials:   Anti-infectives (From admission, onward)   None          Medications  Scheduled Meds: . amLODipine  10 mg Oral Daily  . aspirin EC  81 mg Oral Daily  . chlordiazePOXIDE  25 mg Oral TID  . enoxaparin (LOVENOX) injection  40 mg Subcutaneous Q24H  . folic acid  1 mg Oral Daily  . LORazepam  0-4 mg Intravenous Q6H   Or  . LORazepam  0-4 mg Oral Q6H  . [START ON 05/05/2020] LORazepam  0-4 mg Intravenous Q12H   Or  . [START ON 05/05/2020] LORazepam  0-4 mg Oral Q12H  . metoprolol tartrate  12.5 mg Oral BID  . multivitamin with minerals  1 tablet Oral Daily  . nicotine  21 mg Transdermal Daily  . pantoprazole  40 mg Oral Daily  . thiamine  100 mg Oral Daily   Or  . thiamine  100 mg Intravenous Daily  . thiamine  100 mg Oral Daily   Continuous Infusions: . lactated ringers 125 mL/hr at 05/03/20 0627   PRN Meds:.ipratropium-albuterol, promethazine      Subjective:   [name] was seen and examined today.  Alert and oriented, states has been drinking heavily in the last 1-1/2 months and not eating much.  Patient denies dizziness, chest pain, shortness of breath, abdominal pain, N/V/D/C.  Tremulous, no fevers.  Objective:   Vitals:   05/03/20 0200 05/03/20 0245 05/03/20 0421 05/03/20 0747  BP: (!) 100/55  113/69 (!) 105/57 105/61  Pulse: 99 89 90 97  Resp: 17 (!) 24 20   Temp:   98.4 F (36.9 C)   TempSrc:   Oral   SpO2: 95% 95% 98%     Intake/Output Summary (Last 24 hours) at 05/03/2020 0910 Last data filed at 05/03/2020 0429 Gross per 24 hour  Intake 250 ml  Output --  Net 250 ml     Wt Readings from Last 3 Encounters:  11/23/19 99.8 kg  10/07/19 109.7 kg  03/18/19 97.3 kg  Exam  General: Alert and oriented x 3, NAD  Cardiovascular: S1 S2 auscultated, no murmurs, RRR  Respiratory: Clear to auscultation bilaterally, no wheezing, rales or rhonchi  Gastrointestinal: Soft, nontender, distended, + bowel sounds  Ext: 2+ pedal edema bilaterally, L>R  Neuro: AAOx3, Strength 5/5 upper and lower extremities bilaterally  Musculoskeletal: No digital cyanosis, clubbing  Skin: No rashes  Psych: Normal affect and demeanor, alert and oriented x3    Data Reviewed:  I have personally reviewed following labs and imaging studies  Micro Results Recent Results (from the past 240 hour(s))  SARS Coronavirus 2 by RT PCR (hospital order, performed in [facility] lab) Nasopharyngeal Nasopharyngeal Swab     Status: None   Collection Time: 05/03/20 12:23 AM   Specimen: Nasopharyngeal Swab  Result Value Ref Range Status   SARS Coronavirus 2 NEGATIVE NEGATIVE Final    Comment: (NOTE) SARS-CoV-2 target nucleic acids are NOT DETECTED.  The SARS-CoV-2 RNA is generally detectable in upper and lower respiratory specimens during the acute phase of infection. The lowest concentration of SARS-CoV-2 viral copies this assay can detect is 250 copies / mL. A negative result does not preclude SARS-CoV-2 infection and should not be used as the sole basis for treatment or other patient management decisions.  A negative result may occur with improper specimen collection / handling, submission of specimen other than nasopharyngeal swab, presence of viral mutation(s) within the areas  targeted by this assay, and inadequate number of viral copies (<250 copies / mL). A negative result must be combined with clinical observations, patient history, and epidemiological information.  Fact Sheet for Patients:   StrictlyIdeas.no  Fact Sheet for Healthcare Providers: BankingDealers.co.za  This test is not yet approved or  cleared by the [place] FDA and has been authorized for detection and/or diagnosis of SARS-CoV-2 by FDA under an Emergency Use Authorization (EUA).  This EUA will remain in effect (meaning this test can be used) for the duration of the COVID-19 declaration under Section 564(b)(1) of the Act, 21 U.S.C. section 360bbb-3(b)(1), unless the authorization is terminated or revoked sooner.  Performed at [facility], [place] [address], [place], [place] [address]     Radiology Reports CT HEAD WO CONTRAST  Result Date: 05/03/2020 CLINICAL DATA:  Alcohol withdrawal seizure. EXAM: CT HEAD WITHOUT CONTRAST CT CERVICAL SPINE WITHOUT CONTRAST TECHNIQUE: Multidetector CT imaging of the head and cervical spine was performed following the standard protocol without intravenous contrast. Multiplanar CT image reconstructions of the cervical spine were also generated. COMPARISON:  None. FINDINGS: CT HEAD FINDINGS Brain: There is no mass, hemorrhage or extra-axial collection. The size and configuration of the ventricles and extra-axial CSF spaces are normal. The brain parenchyma is normal, without evidence of acute or chronic infarction. Vascular: No abnormal hyperdensity of the major intracranial arteries or dural venous sinuses. No intracranial atherosclerosis. Skull: The visualized skull base, calvarium and extracranial soft tissues are normal. Sinuses/Orbits: Intermediate density secretions within the right maxillary sinus. The orbits are normal. CT CERVICAL SPINE FINDINGS Alignment: No static subluxation. Facets are aligned.  Occipital condyles are normally positioned. Skull base and vertebrae: No acute fracture. Soft tissues and spinal canal: No prevertebral fluid or swelling. No visible canal hematoma. Disc levels: There is left C6 foraminal narrowing. Upper chest: No pneumothorax, pulmonary nodule or pleural effusion. Other: Normal visualized paraspinal cervical soft tissues. IMPRESSION: 1. No acute intracranial abnormality. 2. No acute fracture or static subluxation of the cervical spine. Electronically Signed   By:  Ulyses Jarred M.D.   On: 05/03/2020 01:37   CT ANGIO CHEST PE W OR WO CONTRAST  Result Date: 05/03/2020 CLINICAL DATA:  Intermediate to low probability for pulmonary embolism. Positive D-dimer and syncope. EXAM: CT ANGIOGRAPHY CHEST WITH CONTRAST TECHNIQUE: Multidetector CT imaging of the chest was performed using the standard protocol during bolus administration of intravenous contrast. Multiplanar CT image reconstructions and MIPs were obtained to evaluate the vascular anatomy. CONTRAST:  127mL OMNIPAQUE IOHEXOL 350 MG/ML SOLN COMPARISON:  06/01/2018 FINDINGS: Cardiovascular: Satisfactory opacification of the pulmonary arteries to the segmental level. No evidence of pulmonary embolism. Normal heart size. No pericardial effusion. Mediastinum/Nodes: Negative for adenopathy or mass. Lungs/Pleura: Generalized airway thickening. A small pulmonary cyst is seen at the left base. There is no edema, consolidation, effusion, or pneumothorax. Upper Abdomen: Severe liver steatosis. Musculoskeletal: Nonacute anterior bilateral rib fractures. Review of the MIP images confirms the above findings. IMPRESSION: 1. Negative for pulmonary embolism or other acute finding. 2. Generalized bronchitic airway thickening. 3. Severe hepatic steatosis. Electronically Signed   By: Monte Fantasia M.D.   On: 05/03/2020 05:18   CT CERVICAL SPINE WO CONTRAST  Result Date: 05/03/2020 CLINICAL DATA:  Alcohol withdrawal seizure. EXAM: CT HEAD  WITHOUT CONTRAST CT CERVICAL SPINE WITHOUT CONTRAST TECHNIQUE: Multidetector CT imaging of the head and cervical spine was performed following the standard protocol without intravenous contrast. Multiplanar CT image reconstructions of the cervical spine were also generated. COMPARISON:  None. FINDINGS: CT HEAD FINDINGS Brain: There is no mass, hemorrhage or extra-axial collection. The size and configuration of the ventricles and extra-axial CSF spaces are normal. The brain parenchyma is normal, without evidence of acute or chronic infarction. Vascular: No abnormal hyperdensity of the major intracranial arteries or dural venous sinuses. No intracranial atherosclerosis. Skull: The visualized skull base, calvarium and extracranial soft tissues are normal. Sinuses/Orbits: Intermediate density secretions within the right maxillary sinus. The orbits are normal. CT CERVICAL SPINE FINDINGS Alignment: No static subluxation. Facets are aligned. Occipital condyles are normally positioned. Skull base and vertebrae: No acute fracture. Soft tissues and spinal canal: No prevertebral fluid or swelling. No visible canal hematoma. Disc levels: There is left C6 foraminal narrowing. Upper chest: No pneumothorax, pulmonary nodule or pleural effusion. Other: Normal visualized paraspinal cervical soft tissues. IMPRESSION: 1. No acute intracranial abnormality. 2. No acute fracture or static subluxation of the cervical spine. Electronically Signed   By: Ulyses Jarred M.D.   On: 05/03/2020 01:37   DG Chest Port 1 View  Result Date: 05/03/2020 CLINICAL DATA:  Edema possible seizure short of breath EXAM: PORTABLE CHEST 1 VIEW COMPARISON:  10/05/2019 FINDINGS: The heart size and mediastinal contours are within normal limits. Both lungs are clear. The visualized skeletal structures are unremarkable. IMPRESSION: No active disease. Electronically Signed   By: Donavan Foil M.D.   On: 05/03/2020 00:25    Lab Data:  CBC: Recent Labs  Lab  05/03/20 0005 05/03/20 0420  WBC 11.3* 10.2  NEUTROABS 8.1*  --   HGB 8.2* 7.4*  HCT 23.5* 20.9*  MCV 93.3 93.3  PLT 384 650   Basic Metabolic Panel: Recent Labs  Lab 05/03/20 0005 05/03/20 0420  NA 129* 131*  K <2.0* 4.1  CL 73* 77*  CO2 35* 35*  GLUCOSE 127* 121*  BUN 6 6  CREATININE 0.72 0.62  CALCIUM 7.6* 7.4*  MG 1.4*  --    GFR: CrCl cannot be calculated (Unknown ideal weight.). Liver Function Tests: Recent Labs  Lab 05/03/20  0005 05/03/20 0420  AST 233* 192*  ALT 94* 80*  ALKPHOS 152* 144*  BILITOT 7.6* 6.7*  PROT 5.4* 5.0*  ALBUMIN 1.9* 1.8*   No results for input(s): LIPASE, AMYLASE in the last 168 hours. Recent Labs  Lab 05/03/20 0005  AMMONIA 46*   Coagulation Profile: No results for input(s): INR, PROTIME in the last 168 hours. Cardiac Enzymes: No results for input(s): CKTOTAL, CKMB, CKMBINDEX, TROPONINI in the last 168 hours. BNP (last 3 results) No results for input(s): PROBNP in the last 8760 hours. HbA1C: No results for input(s): HGBA1C in the last 72 hours. CBG: No results for input(s): GLUCAP in the last 168 hours. Lipid Profile: No results for input(s): CHOL, HDL, LDLCALC, TRIG, CHOLHDL, LDLDIRECT in the last 72 hours. Thyroid Function Tests: No results for input(s): TSH, T4TOTAL, FREET4, T3FREE, THYROIDAB in the last 72 hours. Anemia Panel: Recent Labs    05/03/20 0733  FOLATE 2.9*  RETICCTPCT 0.7   Urine analysis:    Component Value Date/Time   COLORURINE YELLOW 10/05/2019 1700   APPEARANCEUR CLEAR 10/05/2019 1700   LABSPEC 1.006 10/05/2019 1700   PHURINE 7.0 10/05/2019 1700   GLUCOSEU NEGATIVE 10/05/2019 1700   HGBUR NEGATIVE 10/05/2019 1700   BILIRUBINUR NEGATIVE 10/05/2019 1700   KETONESUR NEGATIVE 10/05/2019 1700   PROTEINUR NEGATIVE 10/05/2019 1700   UROBILINOGEN 1.0 12/24/2014 0643   NITRITE NEGATIVE 10/05/2019 1700   LEUKOCYTESUR NEGATIVE 10/05/2019 1700     Nahla Lukin M.D. [facility] 05/03/2020,  9:10 AM   Call night coverage person covering after 7pm

## 2020-05-03 NOTE — H&P (Signed)
History and Physical    Boss Danielsen FYB:[id number] DOB: 12-Jan-1971 DOA: 05/02/2020  PCP: Nicolette Bang, DO   Patient coming from:   Home  Chief Complaint:  Passed out  HPI: [name] is a 49 y.o. male with medical history significant for alcoholism, HTN, GERD, schizoaffective disorder who presents to emergency room by EMS after having a syncopal event at home.  Ports he remembers sitting at his kitchen table and then waking up on the floor.  He reports he has had seizures in the past with alcohol withdrawal.  Reports he drank 5  24 ounce cans of loco yesterday morning but then did not drink for the rest of the day.  He states he did not have confusion when he woke up and he knew where he was in who he was and does not member having any convulsions but he does state he has had seizures in the past and cannot say that he did not have one.  Alcohol level was elevated when he arrived in the emergency room at 144.  He complained of headache and neck pain and had CT of his head and neck which were negative for acute injury or trauma.  He reports he was diagnosed in the past few months with liver disease.  States he has never had yellowing of his eyes or skin though.  States he has not had any specific therapy for the liver disease yet.  He continues to drink heavily every day.  Ports he has not had much to eat the last few days secondary to his drinking.  Reports he did have one episode of vomiting yesterday morning but none since then and he denies any abdominal pain. He denies having any fever, chills, night sweats, cough, shortness of breath, abdominal pain, dysuria or urinary frequency he denies any chest pain or palpitations. Patient smokes 1 1/2 backs of cigarettes a day for the past 30 years.  Drinks alcohol daily.  He reports smoking marijuana occasionally.  States he last smoked marijuana 2 weeks ago.  ED Course: Found to have very low potassium level less than 2 and low  magnesium level.  EKG is abnormal with slight ST depression and prolonged QT interval.  D-dimer value is elevated.  I have ordered CT angiography to further evaluate for PE  Review of Systems:  General: Denies weakness, fever, chills, weight loss, night sweats.  Denies dizziness.  Denies change in appetite HENT: Denies head trauma, headache, denies change in hearing, tinnitus.  Denies nasal congestion or bleeding.  Denies sore throat, sores in mouth.  Denies difficulty swallowing Eyes: Denies blurry vision, pain in eye, drainage.  Denies discoloration of eyes. Neck: Denies pain.  Denies swelling.  Denies pain with movement. Cardiovascular: Denies chest pain, palpitations.  Denies edema.  Denies orthopnea Respiratory: Denies shortness of breath, cough.  Denies wheezing.  Denies sputum production Gastrointestinal: Denies abdominal pain, swelling.  Denies nausea, vomiting, diarrhea.  Denies melena.  Denies hematemesis. Musculoskeletal: Denies limitation of movement.  Denies deformity or swelling.  Denies pain.  Denies arthralgias or myalgias. Genitourinary: Denies pelvic pain.  Denies urinary frequency or hesitancy.  Denies dysuria.  Skin: Denies rash.  Denies petechiae, purpura, ecchymosis. Neurological: Denies headache.  Denies syncope.  Denies seizure activity.  Denies weakness or paresthesia.  Denies slurred speech, drooping face.  Denies visual change. Psychiatric: Denies depression, anxiety.  Denies suicidal thoughts or ideation.  Denies hallucinations.  Past Medical History:  Diagnosis Date  . Alcohol abuse   .  Anxiety    at age 48  . Depression    at age 72  . GERD (gastroesophageal reflux disease)   . Hypertension   . Psychosis ([place])   . PTSD (post-traumatic stress disorder)   . Schizoaffective disorder   . Seizure disorder ([place])    related to etoh seizure    Past Surgical History:  Procedure Laterality Date  . FOOT SURGERY     right  . HAND SURGERY      Social History   reports that he has been smoking cigarettes. He has a 45.00 pack-year smoking history. He has never used smokeless tobacco. He reports current alcohol use. He reports current drug use. Frequency: 1.00 time per week. Drug: Marijuana.  Allergies  Allergen Reactions  . Wellbutrin [Bupropion] Other (See Comments)    Caused seizures    Family History  Problem Relation Age of Onset  . Alcoholism Mother   . CAD Father   . Depression Brother      Prior to Admission medications   Medication Sig Start Date End Date Taking? Authorizing Provider  allopurinol (ZYLOPRIM) 100 MG tablet Take 2 tablets (200 mg total) by mouth daily. 04/20/20   Nicolette Bang, DO  amLODipine (NORVASC) 10 MG tablet Take 1 tablet (10 mg total) by mouth daily. 04/20/20   Nicolette Bang, DO  chlordiazePOXIDE (LIBRIUM) 25 MG capsule 50mg  PO TID x 1D, then 25-50mg  PO BID X 1D, then 25-50mg  PO QD X 1D 11/23/19   Dorie Rank, MD  feeding supplement, ENSURE ENLIVE, (ENSURE ENLIVE) LIQD Take 237 mLs by mouth 2 (two) times daily between meals. Patient not taking: Reported on 10/18/2019 10/07/19   Madalyn Rob, MD  folic acid (FOLVITE) 1 MG tablet Take 1 tablet (1 mg total) by mouth daily. 11/23/19   Dorie Rank, MD  loperamide (IMODIUM) 2 MG capsule Take 4 mg by mouth as needed for diarrhea or loose stools.    [provider]  metoprolol tartrate (LOPRESSOR) 25 MG tablet Take 0.5 tablets (12.5 mg total) by mouth 2 (two) times daily. 04/20/20   Nicolette Bang, DO  Misc. Devices MISC Blood Pressure monitor. Dx :Hypertension 06/09/19   [name], MD  ondansetron (ZOFRAN ODT) 8 MG disintegrating tablet Take 1 tablet (8 mg total) by mouth every 8 (eight) hours as needed for nausea or vomiting. 11/23/19   Dorie Rank, MD  pantoprazole (PROTONIX) 40 MG tablet Take 1 tablet (40 mg total) by mouth daily. 04/20/20   Nicolette Bang, DO    Physical Exam: Vitals:   05/03/20 0045 05/03/20 0148 05/03/20  0200 05/03/20 0245  BP: 117/68 (!) 110/55 (!) 100/55 113/69  Pulse: 90 96 99 89  Resp: (!) 25 20 17  (!) 24  Temp:      TempSrc:      SpO2: 95% 94% 95% 95%    Constitutional: NAD, calm, comfortable Vitals:   05/03/20 0045 05/03/20 0148 05/03/20 0200 05/03/20 0245  BP: 117/68 (!) 110/55 (!) 100/55 113/69  Pulse: 90 96 99 89  Resp: (!) 25 20 17  (!) 24  Temp:      TempSrc:      SpO2: 95% 94% 95% 95%   General: WDWN, Alert and oriented x3.  Eyes: EOMI, PERRL, lids and conjunctivae normal.  Sclera icteric HENT:  [place]/AT, external ears normal.  Nares patent without epistasis.  Mucous membranes are moist. Posterior pharynx clear of any exudate or lesions.  Poor dentition with multiple caries Neck: Soft, in C-spine  collar. supple, no masses, no thyromegaly.  Trachea midline Respiratory: Breath sounds are equal.  Diffuse scattered rales.  No wheezing, no crackles. Normal respiratory effort. No accessory muscle use.  Cardiovascular: Regular rate and rhythm, no murmurs / rubs / gallops.  Has +2 lower extremity edema. 1+ pedal pulses. No carotid bruits.  Abdomen: Soft, no tenderness, nondistended, no rebound or guarding.  No masses palpated.  Obese.  Liver edge palpated 2 to 3 fingerbreadths below costal margin. Bowel sounds normoactive Musculoskeletal: FROM. Has clubbing of digits. No cyanosis. No joint deformity upper and lower extremities. Normal muscle tone.  Skin: Warm, dry, intact no rashes, lesions, ulcers. No induration Neurologic: CN 2-12 grossly intact.  Normal speech.  Sensation intact, patella DTR +1 bilaterally. Strength 4/5 in all extremities.   Psychiatric: Normal judgment and insight.  Normal mood.    Labs on Admission: I have personally reviewed following labs and imaging studies  CBC: Recent Labs  Lab 05/03/20 0005  WBC 11.3*  NEUTROABS 8.1*  HGB 8.2*  HCT 23.5*  MCV 93.3  PLT 443    Basic Metabolic Panel: Recent Labs  Lab 05/03/20 0005  NA 129*  K <2.0*  CL 73*   CO2 35*  GLUCOSE 127*  BUN 6  CREATININE 0.72  CALCIUM 7.6*  MG 1.4*    GFR: CrCl cannot be calculated (Unknown ideal weight.).  Liver Function Tests: Recent Labs  Lab 05/03/20 0005  AST 233*  ALT 94*  ALKPHOS 152*  BILITOT 7.6*  PROT 5.4*  ALBUMIN 1.9*    Urine analysis:    Component Value Date/Time   COLORURINE YELLOW 10/05/2019 1700   APPEARANCEUR CLEAR 10/05/2019 1700   LABSPEC 1.006 10/05/2019 1700   PHURINE 7.0 10/05/2019 1700   GLUCOSEU NEGATIVE 10/05/2019 1700   HGBUR NEGATIVE 10/05/2019 1700   BILIRUBINUR NEGATIVE 10/05/2019 1700   KETONESUR NEGATIVE 10/05/2019 1700   PROTEINUR NEGATIVE 10/05/2019 1700   UROBILINOGEN 1.0 12/24/2014 0643   NITRITE NEGATIVE 10/05/2019 1700   LEUKOCYTESUR NEGATIVE 10/05/2019 1700    Radiological Exams on Admission: CT HEAD WO CONTRAST  Result Date: 05/03/2020 CLINICAL DATA:  Alcohol withdrawal seizure. EXAM: CT HEAD WITHOUT CONTRAST CT CERVICAL SPINE WITHOUT CONTRAST TECHNIQUE: Multidetector CT imaging of the head and cervical spine was performed following the standard protocol without intravenous contrast. Multiplanar CT image reconstructions of the cervical spine were also generated. COMPARISON:  None. FINDINGS: CT HEAD FINDINGS Brain: There is no mass, hemorrhage or extra-axial collection. The size and configuration of the ventricles and extra-axial CSF spaces are normal. The brain parenchyma is normal, without evidence of acute or chronic infarction. Vascular: No abnormal hyperdensity of the major intracranial arteries or dural venous sinuses. No intracranial atherosclerosis. Skull: The visualized skull base, calvarium and extracranial soft tissues are normal. Sinuses/Orbits: Intermediate density secretions within the right maxillary sinus. The orbits are normal. CT CERVICAL SPINE FINDINGS Alignment: No static subluxation. Facets are aligned. Occipital condyles are normally positioned. Skull base and vertebrae: No acute  fracture. Soft tissues and spinal canal: No prevertebral fluid or swelling. No visible canal hematoma. Disc levels: There is left C6 foraminal narrowing. Upper chest: No pneumothorax, pulmonary nodule or pleural effusion. Other: Normal visualized paraspinal cervical soft tissues. IMPRESSION: 1. No acute intracranial abnormality. 2. No acute fracture or static subluxation of the cervical spine. Electronically Signed   By: Ulyses Jarred M.D.   On: 05/03/2020 01:37   CT CERVICAL SPINE WO CONTRAST  Result Date: 05/03/2020 CLINICAL DATA:  Alcohol withdrawal seizure. EXAM:  CT HEAD WITHOUT CONTRAST CT CERVICAL SPINE WITHOUT CONTRAST TECHNIQUE: Multidetector CT imaging of the head and cervical spine was performed following the standard protocol without intravenous contrast. Multiplanar CT image reconstructions of the cervical spine were also generated. COMPARISON:  None. FINDINGS: CT HEAD FINDINGS Brain: There is no mass, hemorrhage or extra-axial collection. The size and configuration of the ventricles and extra-axial CSF spaces are normal. The brain parenchyma is normal, without evidence of acute or chronic infarction. Vascular: No abnormal hyperdensity of the major intracranial arteries or dural venous sinuses. No intracranial atherosclerosis. Skull: The visualized skull base, calvarium and extracranial soft tissues are normal. Sinuses/Orbits: Intermediate density secretions within the right maxillary sinus. The orbits are normal. CT CERVICAL SPINE FINDINGS Alignment: No static subluxation. Facets are aligned. Occipital condyles are normally positioned. Skull base and vertebrae: No acute fracture. Soft tissues and spinal canal: No prevertebral fluid or swelling. No visible canal hematoma. Disc levels: There is left C6 foraminal narrowing. Upper chest: No pneumothorax, pulmonary nodule or pleural effusion. Other: Normal visualized paraspinal cervical soft tissues. IMPRESSION: 1. No acute intracranial abnormality. 2.  No acute fracture or static subluxation of the cervical spine. Electronically Signed   By: Ulyses Jarred M.D.   On: 05/03/2020 01:37   DG Chest Port 1 View  Result Date: 05/03/2020 CLINICAL DATA:  Edema possible seizure short of breath EXAM: PORTABLE CHEST 1 VIEW COMPARISON:  10/05/2019 FINDINGS: The heart size and mediastinal contours are within normal limits. Both lungs are clear. The visualized skeletal structures are unremarkable. IMPRESSION: No active disease. Electronically Signed   By: Donavan Foil M.D.   On: 05/03/2020 00:25    EKG: Independently reviewed.  EKG shows normal sinus rhythm with mild ST depression.  QTc prolonged at 601  Assessment/Plan Principal Problem:   Syncope Mr. Stgermain is admitted to medical telemetry for close monitoring.  Patient with syncopal episode at home which could be related to alcohol intoxication versus alcohol seizure versus cardiac event with his severe electrolyte derangements.  Patient did not remember having any tremors and he did not have bowel or bladder incontinence at the time of the event.  He reports that when he woke up he did not have confusion and knew where he was and who he was.  No witnesses as he lives alone.  He also has an elevated D-dimer level and CT angiography was ordered to make sure PE is not present.  PE can present with syncope in 8 to 10% of cases.  Will obtain EEG to make sure there is no underlying seizure activity  Active Problems:   Hypokalemia Magnesium level was repleted.  Patient has equivalents of IV potassium ordered to replace. Monitor on telemetry. Recheck electrolytes and renal function in the morning    Hypomagnesemia Magnesium level was low and replaced in the emergency room. Recheck level in the morning    Hyponatremia Decreased sodium level which most likely secondary to his alcoholism. Patient placed on IV fluid hydration. Electrolyte levels will be rechecked in morning and monitored    Alcoholic  hepatitis without ascites Patient with alcoholism and recently diagnosed liver disease he states.  Need to follow-up with his PCP and gastroenterology as an outpatient Bilirubin level is elevated and LFTs are elevated we will monitor while in the hospital    Hyperbilirubinemia Bilirubin level elevated over baseline level with mild jaundice present. Pt with known liver disease due to ETOH. Monitor.     D-dimer, elevated  CTA chest to rule out  PE ordered and pending. PE can present with syncope in 8-10% of cases.      Prolonged QT interval Monitor on telemetry. Avoid medications that can further prolonged QT.  Potassium and Magnesium repleted.      Alcoholism Patient placed on CIWA.  Ativan provided as needed for elevated CIWA scores.  Patient started on Librium.  Patient given multivitamin thiamine and folic acid daily Patient will need social work assistance to find outpatient alcohol abstinence program for patient    Tobacco dependence due to cigarettes Nicotine patch provided to prevent nicotine withdrawls. Pt will need smoking cessation education before discharge.     DVT prophylaxis: [place] score elevated.  Lovenox for DVT prophylaxis Code Status:   Full code Family Communication:  Diagnosis and plan discussed with patient.  Patient verbalized understanding agrees with plan.  Further recommendations to follow as clinically indicated Disposition Plan:   Patient is from:  Home  Anticipated DC to:  Home  Anticipated DC date:  Anticipate greater than 2 midnight stay in the hospital to treat acute medical condition  Anticipated DC barriers: No barriers to discharge identified at this time   Admission status:  Inpatient  Severity of Illness: The appropriate patient status for this patient is INPATIENT. Inpatient status is judged to be reasonable and necessary in order to provide the required intensity of service to ensure the patient's safety. The patient's presenting symptoms,  physical exam findings, and initial radiographic and laboratory data in the context of their chronic comorbidities is felt to place them at high risk for further clinical deterioration. Furthermore, it is not anticipated that the patient will be medically stable for discharge from the hospital within 2 midnights of admission. The following factors support the patient status of inpatient.   * I certify that at the point of admission it is my clinical judgment that the patient will require inpatient hospital care spanning beyond 2 midnights from the point of admission due to high intensity of service, high risk for further deterioration and high frequency of surveillance required.Yevonne Aline Takina Busser MD [facility]  How to contact the [facility] Attending or Consulting provider [place] or covering provider during after hours [place], for this patient?   1. Check the care team in [facility] and look for a) attending/consulting [facility] provider listed and b) the [facility] team listed 2. Log into [email or web address] and use [place]'s universal password to access. If you do not have the password, please contact the hospital operator. 3. Locate the [facility] provider you are looking for under [facility] and page to a number that you can be directly reached. 4. If you still have difficulty reaching the provider, please page the [facility] (Director on Call) for the Hospitalists listed on amion for assistance.  05/03/2020, 3:20 AM

## 2020-05-03 NOTE — Progress Notes (Signed)
EEG complete - results pending 

## 2020-05-04 ENCOUNTER — Inpatient Hospital Stay (HOSPITAL_COMMUNITY): Payer: Medicare Other

## 2020-05-04 DIAGNOSIS — R55 Syncope and collapse: Secondary | ICD-10-CM

## 2020-05-04 DIAGNOSIS — F1020 Alcohol dependence, uncomplicated: Secondary | ICD-10-CM

## 2020-05-04 DIAGNOSIS — K7010 Alcoholic hepatitis without ascites: Principal | ICD-10-CM

## 2020-05-04 DIAGNOSIS — E876 Hypokalemia: Secondary | ICD-10-CM

## 2020-05-04 DIAGNOSIS — D500 Iron deficiency anemia secondary to blood loss (chronic): Secondary | ICD-10-CM

## 2020-05-04 LAB — CBC
HCT: 18.2 % — ABNORMAL LOW (ref 39.0–52.0)
Hemoglobin: 6.5 g/dL — CL (ref 13.0–17.0)
MCH: 34 pg (ref 26.0–34.0)
MCHC: 35.7 g/dL (ref 30.0–36.0)
MCV: 95.3 fL (ref 80.0–100.0)
Platelets: 281 10*3/uL (ref 150–400)
RBC: 1.91 MIL/uL — ABNORMAL LOW (ref 4.22–5.81)
RDW: 23.6 % — ABNORMAL HIGH (ref 11.5–15.5)
WBC: 13.2 10*3/uL — ABNORMAL HIGH (ref 4.0–10.5)
nRBC: 0.2 % (ref 0.0–0.2)

## 2020-05-04 LAB — COMPREHENSIVE METABOLIC PANEL
ALT: 84 U/L — ABNORMAL HIGH (ref 0–44)
ALT: 82 U/L — ABNORMAL HIGH (ref 0–44)
AST: 241 U/L — ABNORMAL HIGH (ref 15–41)
AST: 248 U/L — ABNORMAL HIGH (ref 15–41)
Albumin: 1.7 g/dL — ABNORMAL LOW (ref 3.5–5.0)
Albumin: 1.7 g/dL — ABNORMAL LOW (ref 3.5–5.0)
Alkaline Phosphatase: 136 U/L — ABNORMAL HIGH (ref 38–126)
Alkaline Phosphatase: 125 U/L (ref 38–126)
Anion gap: 5 (ref 5–15)
Anion gap: 11 (ref 5–15)
BUN: 11 mg/dL (ref 6–20)
BUN: 7 mg/dL (ref 6–20)
CO2: 43 mmol/L — ABNORMAL HIGH (ref 22–32)
CO2: 36 mmol/L — ABNORMAL HIGH (ref 22–32)
Calcium: 7.8 mg/dL — ABNORMAL LOW (ref 8.9–10.3)
Calcium: 7.8 mg/dL — ABNORMAL LOW (ref 8.9–10.3)
Chloride: 82 mmol/L — ABNORMAL LOW (ref 98–111)
Chloride: 86 mmol/L — ABNORMAL LOW (ref 98–111)
Creatinine, Ser: 0.75 mg/dL (ref 0.61–1.24)
Creatinine, Ser: 0.69 mg/dL (ref 0.61–1.24)
GFR calc Af Amer: >60 mL/min (ref >60)
GFR calc Af Amer: >60 mL/min (ref >60)
GFR calc non Af Amer: >60 mL/min (ref >60)
GFR calc non Af Amer: >60 mL/min (ref >60)
Glucose, Bld: 117 mg/dL — ABNORMAL HIGH (ref 70–99)
Glucose, Bld: 110 mg/dL — ABNORMAL HIGH (ref 70–99)
Potassium: 2.3 mmol/L — CL (ref 3.5–5.1)
Potassium: 3.2 mmol/L — ABNORMAL LOW (ref 3.5–5.1)
Sodium: 130 mmol/L — ABNORMAL LOW (ref 135–145)
Sodium: 133 mmol/L — ABNORMAL LOW (ref 135–145)
Total Bilirubin: 9.5 mg/dL — ABNORMAL HIGH (ref 0.3–1.2)
Total Bilirubin: 9.5 mg/dL — ABNORMAL HIGH (ref 0.3–1.2)
Total Protein: 5 g/dL — ABNORMAL LOW (ref 6.5–8.1)
Total Protein: 5 g/dL — ABNORMAL LOW (ref 6.5–8.1)

## 2020-05-04 LAB — POTASSIUM
Potassium: 2.5 mmol/L — CL (ref 3.5–5.1)
Potassium: 2.6 mmol/L — CL (ref 3.5–5.1)

## 2020-05-04 LAB — MAGNESIUM: Magnesium: 1.6 mg/dL — ABNORMAL LOW (ref 1.7–2.4)

## 2020-05-04 LAB — HEMOGLOBIN AND HEMATOCRIT, BLOOD
HCT: 19.7 % — ABNORMAL LOW (ref 39.0–52.0)
Hemoglobin: 6.8 g/dL — CL (ref 13.0–17.0)

## 2020-05-04 LAB — ABO/RH: ABO/RH(D): O POS

## 2020-05-04 LAB — PROTIME-INR
INR: 1.2 (ref 0.8–1.2)
Prothrombin Time: 14.5 seconds (ref 11.4–15.2)

## 2020-05-04 LAB — PREPARE RBC (CROSSMATCH)

## 2020-05-04 LAB — BRAIN NATRIURETIC PEPTIDE: B Natriuretic Peptide: 235.1 pg/mL — ABNORMAL HIGH (ref 0.0–100.0)

## 2020-05-04 MED ORDER — SODIUM CHLORIDE 0.9% IV SOLUTION: Freq: Once | INTRAVENOUS | Status: DC

## 2020-05-04 MED ORDER — PANTOPRAZOLE SODIUM 40 MG PO TBEC
40.0000 mg | DELAYED_RELEASE_TABLET | Freq: Every day | ORAL | Status: DC
  Administered 2020-05-05 – 2020-05-07 (×3): 40 mg via ORAL
  Filled 2020-05-04 (×3): qty 1

## 2020-05-04 MED ORDER — POTASSIUM CHLORIDE CRYS ER 20 MEQ PO TBCR
40.0000 meq | EXTENDED_RELEASE_TABLET | ORAL | Status: AC
  Administered 2020-05-04 (×2): 40 meq via ORAL
  Filled 2020-05-04 (×2): qty 2

## 2020-05-04 MED ORDER — SODIUM CHLORIDE 0.9% FLUSH
3.0000 mL | Freq: Two times a day (BID) | INTRAVENOUS | Status: DC
  Administered 2020-05-04 – 2020-05-07 (×5): 3 mL via INTRAVENOUS

## 2020-05-04 MED ORDER — FUROSEMIDE 10 MG/ML IJ SOLN
20.0000 mg | Freq: Once | INTRAMUSCULAR | Status: AC
  Administered 2020-05-04: 20 mg via INTRAVENOUS
  Filled 2020-05-04: qty 2

## 2020-05-04 MED ORDER — POTASSIUM CHLORIDE 10 MEQ/100ML IV SOLN
10.0000 meq | INTRAVENOUS | Status: AC
  Administered 2020-05-04 (×3): 10 meq via INTRAVENOUS
  Filled 2020-05-04 (×3): qty 100

## 2020-05-04 MED ORDER — MAGNESIUM SULFATE 50 % IJ SOLN
4.0000 g | Freq: Once | INTRAVENOUS | Status: AC
  Administered 2020-05-04: 4 g via INTRAVENOUS
  Filled 2020-05-04: qty 8

## 2020-05-04 MED ORDER — POTASSIUM CHLORIDE CRYS ER 20 MEQ PO TBCR
40.0000 meq | EXTENDED_RELEASE_TABLET | Freq: Once | ORAL | Status: AC
  Administered 2020-05-04: 40 meq via ORAL
  Filled 2020-05-04: qty 2

## 2020-05-04 MED ORDER — SODIUM CHLORIDE 0.9% FLUSH: 3.0000 mL | INTRAVENOUS | Status: DC | PRN

## 2020-05-04 MED ORDER — SODIUM CHLORIDE 0.9 % IV SOLN: 250.0000 mL | INTRAVENOUS | Status: DC | PRN

## 2020-05-04 NOTE — Progress Notes (Signed)
CRITICAL VALUE ALERT  Critical Value: Potassium 2.5   Date & Time Notied:  05/04/20 1550  Provider Notified: yes  Orders Received/Actions taken: awaiting orders

## 2020-05-04 NOTE — Progress Notes (Signed)
[facility]                                                                              Patient Demographics  [name], is a 49 y.o. male, DOB - 01/09/1971, JAS:[id number]  Admit date - 05/02/2020   Admitting Physician Ryan Burow, MD  Outpatient Primary MD for the patient is Ryan Bang, DO  Outpatient specialists:   LOS - 1  days   Medical records reviewed and are as summarized below:    No chief complaint on file.      Brief summary   Patient is a 49 year old male with history of alcoholism, hypertension, GERD, schizoaffective disorder, presented to ED via EMS after having a syncopal episode at home.  Patient remembers sitting at his kitchen table and waking up on the floor.  Patient reports he has had seizures in the past with alcohol withdrawal.  Patient has been drinking heavily, drank five 24 ounces cans of loco the day before the admission.  Alcohol level was elevated, 144.  Patient complained of headache, neck pain.  CT head and neck was negative for any injury. Patient reports diagnosed in the past 3 months with liver disease, has not had any specific therapy.  He continues to drink heavily every day, not had much to eat in the last 2 days secondary to his drinking.  Reports one episode of vomiting but no hematemesis.  Denied any abdominal pain.  Denies any hematochezia.  Smokes 1/2 packs of cigarettes for past 30 years.  Reports smoking marijuana occasionally.  Assessment & Plan    Principal Problem:   Syncope -Multifactorial, could be related to alcohol intoxication, vasovagal, severe electrolyte derangements.  Did not remember having tremors or urinary or bowel incontinence, no confusion on waking up, unwitnessed. -CT angiogram chest was negative for PE, follow EEG -CT head and neck negative for acute abnormality -  Had an echo in 09/2019 showed EF of 60 to 41%, grade 1 diastolic dysfunction.  Troponin 14->18, no chest  pain -PT evaluation pending  Active Problems: Alcoholic liver disease, alcoholic hepatitis, hyperbilirubinemia -Ultrasound abdomen to assess for ascites, -Counseled strongly on alcohol cessation -Abdominal ultrasound with sludge in gallbladder no evident gallstones, gallbladder wall thickening or pericholecystic fluid, hepatic steatosis, no focal liver lesions -Lower extremity pitting edema left >right, Doppler ultrasound negative for DVT -Bilirubin trending up, transaminitis, GI consulted, await recommendations  Acute on chronic anemia, folic acid deficiency -Hemoglobin 11.9 in April 2021, trended down to 8.2, 7.4 on admission, today 6.5, no obvious bleeding however patient is also poor historian -No prior GI work-up, given alcoholic liver disease, may have underlying varices, PUD, GI consulted -Continue folate 1 mg daily  Alcohol abuse -Continue CIWA protocol with Ativan, currently somewhat tremulous -Has history of seizures in the past due to withdrawals, follow closely -Continue thiamine, folate  Hyponatremia, metabolic alkalosis -Possibly due to beer potomania  -Obtain serum osmolality, urine osmolality, UNA, TSH -Sodium 129 at the time of admission, improving to 131  Severe hypokalemia, hypomagnesemia -Likely due to alcoholism, potassium less than 2 at the time of admission -Potassium 2.3, magnesium 1.6, -Replaced potassium  p.o. and IV, magnesium 4 g IV x1, repeat potassium leve     Tobacco dependence due to cigarettes -Placed on nicotine patch    Prolonged QT interval -Continue telemetry, avoid QT prolonging meds -Potassium, magnesium replaced -QTC this morning 621 on EKG, replace potassium, magnesium, repeat EKG in a.m.   History of schizoaffective disorder -Not on any meds outpatient per med reconciliation, states he has been drinking alcohol to self treat his schizoaffective disorder. -Psychiatry consulted, appreciate recommendation, -Recommended starting  Seroquel 50 mg p.o.qhs, once QTC prolongation is corrected, Zoloft 25 mg p.o. daily once nausea and vomiting has improved  Obesity Estimated body mass index is 31.51 kg/m as calculated from the following:   Height as of 11/23/19: 6' (1.829 m).   Weight as of this encounter: 105.4 kg.  Code Status: Full code DVT Prophylaxis SCD's Family Communication: Discussed all imaging results, lab results, explained to the patient   Disposition Plan:     Status is: Inpatient  Remains inpatient appropriate because:Inpatient level of care appropriate due to severity of illness   Dispo: The patient is from: Home              Anticipated d/c is to: Home              Anticipated d/c date is: 2 days              Patient currently is not medically stable to d/c.  Awaiting GI evaluation    Time Spent in minutes 35 minutes  Procedures:  CT angiogram chest   Consultants:   Gastroenterology  Antimicrobials:   Anti-infectives (From admission, onward)   None         Medications  Scheduled Meds: . sodium chloride   Intravenous Once  . chlordiazePOXIDE  25 mg Oral TID  . folic acid  1 mg Oral Daily  . LORazepam  0-4 mg Intravenous Q6H   Or  . LORazepam  0-4 mg Oral Q6H  . [START ON 05/05/2020] LORazepam  0-4 mg Intravenous Q12H   Or  . [START ON 05/05/2020] LORazepam  0-4 mg Oral Q12H  . metoprolol tartrate  12.5 mg Oral BID  . multivitamin with minerals  1 tablet Oral Daily  . nicotine  21 mg Transdermal Daily  . [START ON 05/05/2020] pantoprazole  40 mg Oral Q0600  . sodium chloride flush  3 mL Intravenous Q12H  . thiamine  100 mg Oral Daily   Or  . thiamine  100 mg Intravenous Daily  . thiamine  100 mg Oral Daily   Continuous Infusions: . sodium chloride    . magnesium sulfate LVP 250-500 ml 4 g (05/04/20 1349)   PRN Meds:.sodium chloride, ipratropium-albuterol, prochlorperazine, sodium chloride flush      Subjective:   [name] was seen and examined today.   Alert and oriented, states that nauseous and vomiting yesterday but no hematemesis.  Poor appetite.  Patient denies dizziness, chest pain, shortness of breath, abdominal pain, Tremulous, no fevers.  Objective:   Vitals:   05/04/20 0521 05/04/20 1125 05/04/20 1144 05/04/20 1344  BP: 118/64 (!) 115/56 108/72 103/69  Pulse: (!) 103 92 92 99  Resp: 18 18 18 16   Temp: 98.4 F (36.9 C) 98.6 F (37 C) 98.6 F (37 C) 97.9 F (36.6 C)  TempSrc: Oral Oral Oral Oral  SpO2: 95% 93% 95% 96%  Weight:        Intake/Output Summary (Last 24 hours) at 05/04/2020 1438 Last data filed  at 05/04/2020 1344 Gross per 24 hour  Intake 1593 ml  Output 1000 ml  Net 593 ml     Wt Readings from Last 3 Encounters:  05/03/20 105.4 kg  11/23/19 99.8 kg  10/07/19 109.7 kg    Physical Exam  General: Alert and oriented x 3, NAD  Cardiovascular: S1 S2 clear, RRR. 1+ pedal edema b/l  Respiratory: CTAB, no wheezing, rales or rhonchi  Gastrointestinal: Soft, nontender, nondistended, NBS  Ext: 1!+ pedal edema bilaterally  Neuro: no new deficits  Musculoskeletal: No cyanosis, clubbing  Skin: No rashes  Psych: Normal affect and demeanor, alert and oriented x3      Data Reviewed:  I have personally reviewed following labs and imaging studies  Micro Results Recent Results (from the past 240 hour(s))  SARS Coronavirus 2 by RT PCR (hospital order, performed in [facility] hospital lab) Nasopharyngeal Nasopharyngeal Swab     Status: None   Collection Time: 05/03/20 12:23 AM   Specimen: Nasopharyngeal Swab  Result Value Ref Range Status   SARS Coronavirus 2 NEGATIVE NEGATIVE Final    Comment: (NOTE) SARS-CoV-2 target nucleic acids are NOT DETECTED.  The SARS-CoV-2 RNA is generally detectable in upper and lower respiratory specimens during the acute phase of infection. The lowest concentration of SARS-CoV-2 viral copies this assay can detect is 250 copies / mL. A negative result does not preclude  SARS-CoV-2 infection and should not be used as the sole basis for treatment or other patient management decisions.  A negative result may occur with improper specimen collection / handling, submission of specimen other than nasopharyngeal swab, presence of viral mutation(s) within the areas targeted by this assay, and inadequate number of viral copies (<250 copies / mL). A negative result must be combined with clinical observations, patient history, and epidemiological information.  Fact Sheet for Patients:   StrictlyIdeas.no  Fact Sheet for Healthcare Providers: BankingDealers.co.za  This test is not yet approved or  cleared by the [place] FDA and has been authorized for detection and/or diagnosis of SARS-CoV-2 by FDA under an Emergency Use Authorization (EUA).  This EUA will remain in effect (meaning this test can be used) for the duration of the COVID-19 declaration under Section 564(b)(1) of the Act, 21 U.S.C. section 360bbb-3(b)(1), unless the authorization is terminated or revoked sooner.  Performed at [facility], [place] [address], [place], [place] [address]     Radiology Reports CT HEAD WO CONTRAST  Result Date: 05/03/2020 CLINICAL DATA:  Alcohol withdrawal seizure. EXAM: CT HEAD WITHOUT CONTRAST CT CERVICAL SPINE WITHOUT CONTRAST TECHNIQUE: Multidetector CT imaging of the head and cervical spine was performed following the standard protocol without intravenous contrast. Multiplanar CT image reconstructions of the cervical spine were also generated. COMPARISON:  None. FINDINGS: CT HEAD FINDINGS Brain: There is no mass, hemorrhage or extra-axial collection. The size and configuration of the ventricles and extra-axial CSF spaces are normal. The brain parenchyma is normal, without evidence of acute or chronic infarction. Vascular: No abnormal hyperdensity of the major intracranial arteries or dural venous sinuses. No  intracranial atherosclerosis. Skull: The visualized skull base, calvarium and extracranial soft tissues are normal. Sinuses/Orbits: Intermediate density secretions within the right maxillary sinus. The orbits are normal. CT CERVICAL SPINE FINDINGS Alignment: No static subluxation. Facets are aligned. Occipital condyles are normally positioned. Skull base and vertebrae: No acute fracture. Soft tissues and spinal canal: No prevertebral fluid or swelling. No visible canal hematoma. Disc levels: There is left C6 foraminal narrowing. Upper  chest: No pneumothorax, pulmonary nodule or pleural effusion. Other: Normal visualized paraspinal cervical soft tissues. IMPRESSION: 1. No acute intracranial abnormality. 2. No acute fracture or static subluxation of the cervical spine. Electronically Signed   By: Ulyses Jarred M.D.   On: 05/03/2020 01:37   CT ANGIO CHEST PE W OR WO CONTRAST  Result Date: 05/03/2020 CLINICAL DATA:  Intermediate to low probability for pulmonary embolism. Positive D-dimer and syncope. EXAM: CT ANGIOGRAPHY CHEST WITH CONTRAST TECHNIQUE: Multidetector CT imaging of the chest was performed using the standard protocol during bolus administration of intravenous contrast. Multiplanar CT image reconstructions and MIPs were obtained to evaluate the vascular anatomy. CONTRAST:  113mL OMNIPAQUE IOHEXOL 350 MG/ML SOLN COMPARISON:  06/01/2018 FINDINGS: Cardiovascular: Satisfactory opacification of the pulmonary arteries to the segmental level. No evidence of pulmonary embolism. Normal heart size. No pericardial effusion. Mediastinum/Nodes: Negative for adenopathy or mass. Lungs/Pleura: Generalized airway thickening. A small pulmonary cyst is seen at the left base. There is no edema, consolidation, effusion, or pneumothorax. Upper Abdomen: Severe liver steatosis. Musculoskeletal: Nonacute anterior bilateral rib fractures. Review of the MIP images confirms the above findings. IMPRESSION: 1. Negative for pulmonary  embolism or other acute finding. 2. Generalized bronchitic airway thickening. 3. Severe hepatic steatosis. Electronically Signed   By: Monte Fantasia M.D.   On: 05/03/2020 05:18   CT CERVICAL SPINE WO CONTRAST  Result Date: 05/03/2020 CLINICAL DATA:  Alcohol withdrawal seizure. EXAM: CT HEAD WITHOUT CONTRAST CT CERVICAL SPINE WITHOUT CONTRAST TECHNIQUE: Multidetector CT imaging of the head and cervical spine was performed following the standard protocol without intravenous contrast. Multiplanar CT image reconstructions of the cervical spine were also generated. COMPARISON:  None. FINDINGS: CT HEAD FINDINGS Brain: There is no mass, hemorrhage or extra-axial collection. The size and configuration of the ventricles and extra-axial CSF spaces are normal. The brain parenchyma is normal, without evidence of acute or chronic infarction. Vascular: No abnormal hyperdensity of the major intracranial arteries or dural venous sinuses. No intracranial atherosclerosis. Skull: The visualized skull base, calvarium and extracranial soft tissues are normal. Sinuses/Orbits: Intermediate density secretions within the right maxillary sinus. The orbits are normal. CT CERVICAL SPINE FINDINGS Alignment: No static subluxation. Facets are aligned. Occipital condyles are normally positioned. Skull base and vertebrae: No acute fracture. Soft tissues and spinal canal: No prevertebral fluid or swelling. No visible canal hematoma. Disc levels: There is left C6 foraminal narrowing. Upper chest: No pneumothorax, pulmonary nodule or pleural effusion. Other: Normal visualized paraspinal cervical soft tissues. IMPRESSION: 1. No acute intracranial abnormality. 2. No acute fracture or static subluxation of the cervical spine. Electronically Signed   By: Ulyses Jarred M.D.   On: 05/03/2020 01:37   US Abdomen Complete  Result Date: 05/03/2020 CLINICAL DATA:  Right-sided pain EXAM: ABDOMEN ULTRASOUND COMPLETE COMPARISON:  Abdominal ultrasound  October 06, 2019 FINDINGS: Gallbladder: Sludge is present in the gallbladder. There are no gallstones, gallbladder wall thickening, or pericholecystic fluid. No sonographic Murphy sign noted by sonographer. Common bile duct: Diameter: 4 mm. No intrahepatic or extrahepatic biliary duct dilatation. Liver: No focal lesion identified. Liver echogenicity is increased diffusely. Portal vein is patent on color Doppler imaging with normal direction of blood flow towards the liver. IVC: No abnormality visualized. Pancreas: Visualized portion unremarkable. Portions of pancreas obscured by gas. Spleen: Size and appearance within normal limits. Right Kidney: Length: 12.3 cm. Echogenicity within normal limits. No mass or hydronephrosis visualized. Left Kidney: Length: 12.7 cm. Echogenicity within normal limits. No mass or hydronephrosis visualized. Abdominal  aorta: No aneurysm visualized. Other findings: No evident ascites. IMPRESSION: 1. Sludge in gallbladder. No evident gallstones, gallbladder wall thickening, or pericholecystic fluid. 2. Diffuse increase in liver echogenicity, a finding indicative of hepatic steatosis. No focal liver lesions are evident; it must be cautioned that the sensitivity of ultrasound for detection of focal liver lesions is diminished in this circumstance. 3. Portions of pancreas obscured by gas. Visualized portions of pancreas appear unremarkable. 4.  Study otherwise unremarkable. Electronically Signed   By: Lowella Grip III M.D.   On: 05/03/2020 10:08   DG CHEST PORT 1 VIEW  Result Date: 05/04/2020 CLINICAL DATA:  Dyspnea EXAM: PORTABLE CHEST 1 VIEW COMPARISON:  05/03/2020 FINDINGS: Mild lingular linear airspace disease likely reflecting atelectasis. No focal consolidation. No pleural effusion or pneumothorax. Heart and mediastinal contours are unremarkable. No acute osseous abnormality. IMPRESSION: No acute cardiopulmonary disease. Electronically Signed   By: Kathreen Devoid   On: 05/04/2020  11:51   DG Chest Port 1 View  Result Date: 05/03/2020 CLINICAL DATA:  Edema possible seizure short of breath EXAM: PORTABLE CHEST 1 VIEW COMPARISON:  10/05/2019 FINDINGS: The heart size and mediastinal contours are within normal limits. Both lungs are clear. The visualized skeletal structures are unremarkable. IMPRESSION: No active disease. Electronically Signed   By: Donavan Foil M.D.   On: 05/03/2020 00:25   EEG adult  Result Date: 05/03/2020 Lora Havens, MD     05/03/2020 11:55 AM Patient Name: Samarion Ehle MRN: [id number] Epilepsy Attending: Lora Havens Referring Physician/Provider: Dr Harrold Donath Date: 05/03/2020 Duration: 24.37 mins Patient history: 49yo m with syncope. EEG to evaluate for seizure. Level of alertness: Awake, asleep AEDs during EEG study: Lorazepam Technical aspects: This EEG study was done with scalp electrodes positioned according to the 10-20 International system of electrode placement. Electrical activity was acquired at a sampling rate of 500Hz  and reviewed with a high frequency filter of 70Hz  and a low frequency filter of 1Hz . EEG data were recorded continuously and digitally stored. Description: No posterior dominant rhythm was seen. Sleep was characterized by vertex waves, sleep spindles (12 to 14 Hz), maximal frontocentral region.  There is an excessive amount of 15 to 18 Hz beta activity with irregular morphology distributed symmetrically and diffusely. Physiologic photic driving was seen during photic stimulation.  Hyperventilation was not performed.   ABNORMALITY -Excessive beta, generalized IMPRESSION: This study is within normal limits. No seizures or epileptiform discharges were seen throughout the recording. The excessive beta activity seen in the background is most likely due to the effect of benzodiazepine and is a benign EEG pattern. Lora Havens    Lab Data:  CBC: Recent Labs  Lab 05/03/20 0005 05/03/20 0420 05/04/20 0553  WBC 11.3*  10.2 13.2*  NEUTROABS 8.1*  --   --   HGB 8.2* 7.4* 6.5*  HCT 23.5* 20.9* 18.2*  MCV 93.3 93.3 95.3  PLT 384 341 250   Basic Metabolic Panel: Recent Labs  Lab 05/03/20 0005 05/03/20 0420 05/04/20 0553  NA 129* 131* 130*  K <2.0* 4.1 2.3*  CL 73* 77* 82*  CO2 35* 35* 43*  GLUCOSE 127* 121* 117*  BUN 6 6 11   CREATININE 0.72 0.62 0.75  CALCIUM 7.6* 7.4* 7.8*  MG 1.4*  --  1.6*   GFR: Estimated Creatinine Clearance: 143.2 mL/min (by C-G formula based on SCr of 0.75 mg/dL). Liver Function Tests: Recent Labs  Lab 05/03/20 0005 05/03/20 0420 05/04/20 0553  AST 233* 192* 241*  ALT  94* 80* 84*  ALKPHOS 152* 144* 136*  BILITOT 7.6* 6.7* 9.5*  PROT 5.4* 5.0* 5.0*  ALBUMIN 1.9* 1.8* 1.7*   No results for input(s): LIPASE, AMYLASE in the last 168 hours. Recent Labs  Lab 05/03/20 0005  AMMONIA 46*   Coagulation Profile: No results for input(s): INR, PROTIME in the last 168 hours. Cardiac Enzymes: No results for input(s): CKTOTAL, CKMB, CKMBINDEX, TROPONINI in the last 168 hours. BNP (last 3 results) No results for input(s): PROBNP in the last 8760 hours. HbA1C: No results for input(s): HGBA1C in the last 72 hours. CBG: No results for input(s): GLUCAP in the last 168 hours. Lipid Profile: No results for input(s): CHOL, HDL, LDLCALC, TRIG, CHOLHDL, LDLDIRECT in the last 72 hours. Thyroid Function Tests: Recent Labs    05/03/20 0918  TSH 0.987   Anemia Panel: Recent Labs    05/03/20 0733  VITAMINB12 612  FOLATE 2.9*  FERRITIN 2,307*  TIBC 129*  IRON 122  RETICCTPCT 0.7   Urine analysis:    Component Value Date/Time   COLORURINE YELLOW 10/05/2019 1700   APPEARANCEUR CLEAR 10/05/2019 1700   LABSPEC 1.006 10/05/2019 1700   PHURINE 7.0 10/05/2019 1700   GLUCOSEU NEGATIVE 10/05/2019 1700   HGBUR NEGATIVE 10/05/2019 1700   BILIRUBINUR NEGATIVE 10/05/2019 1700   KETONESUR NEGATIVE 10/05/2019 1700   PROTEINUR NEGATIVE 10/05/2019 1700   UROBILINOGEN 1.0  12/24/2014 0643   NITRITE NEGATIVE 10/05/2019 1700   LEUKOCYTESUR NEGATIVE 10/05/2019 1700     Cadience Bradfield M.D. [facility] 05/04/2020, 2:38 PM   Call night coverage person covering after 7pm

## 2020-05-04 NOTE — Progress Notes (Signed)
CRITICAL VALUE ALERT  Critical Value:  Potassium 2.6  Date & Time Notied:  05/04/20 1855  Provider Notified: yes  Orders Received/Actions taken: awaiting

## 2020-05-04 NOTE — Progress Notes (Signed)
HOSPITAL MEDICINE OVERNIGHT EVENT NOTE    Notified by nursing that hemoglobin is 6.5 this morning.  Per nursing, patient is exhibiting no clinical evidence of bleeding.   Chart reviewed, patient is likely suffering from a combination of anemia of chronic disease exacerbated by hemodilution from IVF.  Will order type and screen followed by administration of 1 unit PRBC.   Vernelle Emerald  MD [facility]

## 2020-05-04 NOTE — Progress Notes (Signed)
CRITICAL VALUE ALERT  Critical Value:  Potassium 2.9  Date & Time Notied:  05/04/20 0744am  Provider Notified: yes  Orders Received/Actions taken: awaiting orders

## 2020-05-04 NOTE — H&P (View-Only) (Signed)
[facility] Consult: 10:49 AM 05/04/2020  LOS: 1 day    Referring Provider: Dr Tana Coast Primary Care Physician:  Nicolette Bang, DO Primary Gastroenterologist:  Althia Forts pt   Reason for Consultation:  Anemia.  ETOH liver dz.     HPI: [name] is a 49 y.o. male.  Pmh Chronci ETOH  Aabuse, dependence.  Depression, anxiety, PTSD, schizoaffective d/o.  Non-compliant w psych meds. Seizures due to ETOH 02/2019.  Rhabdo 02/2019.  GERD.  Htn. Grade 1 DD and LVEF 60% on Echo 09/2019. No prior upper or lower endoscopy.  ETOH hepatitis, dates to at least 02/2014 and hepatic steatosis seen on ultrasound then as well as later CTs and ultrasounds of 2017, 2019, 2021. Has never had ascites on imaging.   Acute hepatitis serologies were negative in 12/2018.   Presented to ED 2 d ago and admitted w seizure vs syncope at home Lasts ETOH was five, 24 oz cans of "4Loco" on 9/15.   Per google search: "Four Loko contains carbonated water, sugar ...  is either 6%, 7%, 8%, 10%,12% or 14% alcohol by volume (ABV), depending on state regulations... packaged in 23.5 oz (695 mL) cans."  Occasional emesis, vomited AM 9/15, no blood or CG.  Intermittent black but not tarry stools.  No abdominal pain.  Uses Aleve a few times a week. Denies icterus, jaundice. Urine has been dark for a few days.  Denies excessive or unusual bleeding or bruising.  Compliant w Protonix 40 mg daily in addition to daily milk thistle.  K this AM 2.3.  Na 130 (129 nadir yesterday).   t bili 9.5.  Alk phos 241.  AST/ALT 241/84.  Albumin 1.7 Creat 0.7.    INR not assayed.  Hgb 8.2 >> 6.5, was 11.2 on 11/23/19.  MCV 95.  platlets 281.    1 U PRBC ordered.    Ultrasound 05/02/20 w fatty liver, GB sludge, 4 mm CBD, patent PV w normal directional flow.     Smokes 30 cigs daily.  occ smokes mja, last use 2 weeks ago.   Disabled, lives alone in an appartment. Pat GF had ETOH cirrhosis and emphysema.    Past Medical History:  Diagnosis Date  . Alcohol abuse   . Anxiety    at age 56  . Depression    at age 29  . GERD (gastroesophageal reflux disease)   . Hypertension   . Psychosis ([place])   . PTSD (post-traumatic stress disorder)   . Schizoaffective disorder   . Seizure disorder ([place])    related to etoh seizure    Past Surgical History:  Procedure Laterality Date  . FOOT SURGERY     right  . HAND SURGERY      Prior to Admission medications   Medication Sig Start Date End Date Taking? Authorizing Provider  allopurinol (ZYLOPRIM) 100 MG tablet Take 2 tablets (200 mg total) by mouth daily. 04/20/20  Yes Nicolette Bang, DO  amLODipine (NORVASC) 10 MG tablet Take 1 tablet (10 mg total) by mouth daily. 04/20/20  Yes  Nicolette Bang, DO  loperamide (IMODIUM) 2 MG capsule Take 4 mg by mouth as needed for diarrhea or loose stools.   Yes [provider]  metoprolol tartrate (LOPRESSOR) 25 MG tablet Take 0.5 tablets (12.5 mg total) by mouth 2 (two) times daily. 04/20/20  Yes Nicolette Bang, DO  MILK THISTLE PO Take 1 capsule by mouth daily.   Yes [provider]  Misc. Devices MISC Blood Pressure monitor. Dx :Hypertension 06/09/19  Yes Newlin, Enobong, MD  pantoprazole (PROTONIX) 40 MG tablet Take 1 tablet (40 mg total) by mouth daily. 04/20/20  Yes Nicolette Bang, DO  chlordiazePOXIDE (LIBRIUM) 25 MG capsule 17m PO TID x 1D, then 25-575mPO BID X 1D, then 25-506mO QD X 1D Patient not taking: Reported on 05/03/2020 11/23/19   KnaDorie RankD  feeding supplement, ENSURE ENLIVE, (ENSURE ENLIVE) LIQD Take 237 mLs by mouth 2 (two) times daily between meals. Patient not taking: Reported on 10/18/2019 10/07/19   SteMadalyn RobD  folic acid (FOLVITE) 1 MG tablet Take 1 tablet (1 mg total) by mouth  daily. Patient not taking: Reported on 05/03/2020 11/23/19   KnaDorie RankD  ondansetron (ZOFRAN ODT) 8 MG disintegrating tablet Take 1 tablet (8 mg total) by mouth every 8 (eight) hours as needed for nausea or vomiting. Patient not taking: Reported on 05/03/2020 11/23/19   KnaDorie RankD    Scheduled Meds: . sodium chloride   Intravenous Once  . amLODipine  10 mg Oral Daily  . chlordiazePOXIDE  25 mg Oral TID  . folic acid  1 mg Oral Daily  . LORazepam  0-4 mg Intravenous Q6H   Or  . LORazepam  0-4 mg Oral Q6H  . [START ON 05/05/2020] LORazepam  0-4 mg Intravenous Q12H   Or  . [START ON 05/05/2020] LORazepam  0-4 mg Oral Q12H  . metoprolol tartrate  12.5 mg Oral BID  . multivitamin with minerals  1 tablet Oral Daily  . nicotine  21 mg Transdermal Daily  . potassium chloride  40 mEq Oral Q4H  . sodium chloride flush  3 mL Intravenous Q12H  . thiamine  100 mg Oral Daily   Or  . thiamine  100 mg Intravenous Daily  . thiamine  100 mg Oral Daily   Infusions: . sodium chloride    . potassium chloride 10 mEq (05/04/20 1027)   PRN Meds: sodium chloride, ipratropium-albuterol, prochlorperazine, sodium chloride flush   Allergies as of 05/02/2020 - Review Complete 05/02/2020  Allergen Reaction Noted  . Wellbutrin [bupropion] Other (See Comments) 10/18/2014    Family History  Problem Relation Age of Onset  . Alcoholism Mother   . CAD Father   . Depression Brother     Social History   Socioeconomic History  . Marital status: Divorced    Spouse name: Not on file  . Number of children: Not on file  . Years of education: Not on file  . Highest education level: Not on file  Occupational History  . Not on file  Tobacco Use  . Smoking status: Current Every Day Smoker    Packs/day: 1.50    Years: 30.00    Pack years: 45.00    Types: Cigarettes  . Smokeless tobacco: Never Used  Vaping Use  . Vaping Use: Every day  Substance and Sexual Activity  . Alcohol use: Yes     Alcohol/week: 0.0 standard drinks    Comment: drinking daily   . Drug use: Yes  Frequency: 1.0 times per week    Types: Marijuana    Comment: THC 2 to 3 times per month. last use 09/24/2015  . Sexual activity: Yes    Birth control/protection: Condom  Other Topics Concern  . Not on file  Social History Narrative  . Not on file   Social Determinants of Health   Financial Resource Strain:   . Difficulty of Paying Living Expenses: Not on file  Food Insecurity: No Food Insecurity  . Worried About Charity fundraiser in the Last Year: Never true  . Ran Out of Food in the Last Year: Never true  Transportation Needs: Unmet Transportation Needs  . Lack of Transportation (Medical): Yes  . Lack of Transportation (Non-Medical): Yes  Physical Activity:   . Days of Exercise per Week: Not on file  . Minutes of Exercise per Session: Not on file  Stress:   . Feeling of Stress : Not on file  Social Connections:   . Frequency of Communication with Friends and Family: Not on file  . Frequency of Social Gatherings with Friends and Family: Not on file  . Attends Religious Services: Not on file  . Active Member of Clubs or Organizations: Not on file  . Attends Archivist Meetings: Not on file  . Marital Status: Not on file  Intimate Partner Violence:   . Fear of Current or Ex-Partner: Not on file  . Emotionally Abused: Not on file  . Physically Abused: Not on file  . Sexually Abused: Not on file    REVIEW OF SYSTEMS: Constitutional:  Fatigue, weakness ENT:  No nose bleeds Pulm:  No SOB, no cough CV:  No palpitations, no LE edema.  GU:  No hematuria, no frequency GI:  See HPI Heme:  Denies unusual bleeding or bruising. Transfusions: None prior to today. Neuro: Syncope versus seizure at home no headaches, no peripheral tingling or numbness Derm:  No itching, no rash or sores.  Endocrine:  No sweats or chills.  No polyuria or dysuria Immunization: Not queried Travel:  None  beyond local counties in last few months.    PHYSICAL EXAM: Vital signs in last 24 hours: Vitals:   05/03/20 2025 05/04/20 0521  BP: 126/68 118/64  Pulse: 93 (!) 103  Resp: 16 18  Temp: 98.3 F (36.8 C) 98.4 F (36.9 C)  SpO2: 100% 95%   Wt Readings from Last 3 Encounters:  05/03/20 105.4 kg  11/23/19 99.8 kg  10/07/19 109.7 kg    General: Slightly tremulous, slightly diaphoretic, somewhat disheveled, sallow but not obviously jaundiced coloring.  Looks ill and bloated Head: Some facial swelling.  No signs of head trauma. Eyes: Scleral icterus.  No conjunctival pallor. Ears: Not hard of hearing Nose: No congestion or discharge Mouth: Poor dentition.  75% or more of his teeth are absent.  The remaining teeth are riddled with caries.  Mucosa is moist, pink, clear.  Tongue is midline. Neck: No JVD, no masses, no thyromegaly Lungs: Diminished breath sounds in the setting of poor inspiratory effort.  No adventitious sounds.  No labored breathing or cough. Heart: RRR.  No MRG.  S1, S2 present Abdomen: Obese, moderately distended.  Not tender.  Bowel sounds hypoactive but no tinkling/rushing or high-pitched bowel sounds.   Rectal: Deferred Musc/Skeltl: No joint redness, swelling or gross deformities Extremities: No CCE. Neurologic: Alert.  Oriented x3.  Tremulous but no asterixis. Skin: No rash, no sores, no telangiectasia. Tattoos: Extensive professional tattoos on the right arm.  Nodes: No cervical adenopathy Psych: Paucity of speech, but does answer questions appropriately.  Cooperative, not agitated.  Intake/Output from previous day: 09/16 0701 - 09/17 0700 In: 1157 [P.O.:480; I.V.:775] Out: 800 [Urine:800] Intake/Output this shift: No intake/output data recorded.  LAB RESULTS: Recent Labs    05/03/20 0005 05/03/20 0420 05/04/20 0553  WBC 11.3* 10.2 13.2*  HGB 8.2* 7.4* 6.5*  HCT 23.5* 20.9* 18.2*  PLT 384 341 281   BMET Lab Results  Component Value Date   NA  130 (L) 05/04/2020   NA 131 (L) 05/03/2020   NA 129 (L) 05/03/2020   K 2.3 (LL) 05/04/2020   K 4.1 05/03/2020   K <2.0 (LL) 05/03/2020   CL 82 (L) 05/04/2020   CL 77 (L) 05/03/2020   CL 73 (L) 05/03/2020   CO2 43 (H) 05/04/2020   CO2 35 (H) 05/03/2020   CO2 35 (H) 05/03/2020   GLUCOSE 117 (H) 05/04/2020   GLUCOSE 121 (H) 05/03/2020   GLUCOSE 127 (H) 05/03/2020   BUN 11 05/04/2020   BUN 6 05/03/2020   BUN 6 05/03/2020   CREATININE 0.75 05/04/2020   CREATININE 0.62 05/03/2020   CREATININE 0.72 05/03/2020   CALCIUM 7.8 (L) 05/04/2020   CALCIUM 7.4 (L) 05/03/2020   CALCIUM 7.6 (L) 05/03/2020   LFT Recent Labs    05/03/20 0005 05/03/20 0420 05/04/20 0553  PROT 5.4* 5.0* 5.0*  ALBUMIN 1.9* 1.8* 1.7*  AST 233* 192* 241*  ALT 94* 80* 84*  ALKPHOS 152* 144* 136*  BILITOT 7.6* 6.7* 9.5*  BILIDIR 4.2*  --   --   IBILI 3.4*  --   --    PT/INR Lab Results  Component Value Date   INR 0.9 10/05/2019   INR 1.0 03/06/2019   INR 1.0 03/02/2019   Hepatitis Panel No results for input(s): HEPBSAG, HCVAB, HEPAIGM, HEPBIGM in the last 72 hours. C-Diff No components found for: CDIFF Lipase     Component Value Date/Time   LIPASE 25 04/06/2016 1040    Drugs of Abuse     Component Value Date/Time   LABOPIA NONE DETECTED 05/03/2020 0733   COCAINSCRNUR NONE DETECTED 05/03/2020 0733   LABBENZ NONE DETECTED 05/03/2020 0733   AMPHETMU NONE DETECTED 05/03/2020 0733   THCU POSITIVE (A) 05/03/2020 0733   LABBARB NONE DETECTED 05/03/2020 0733     RADIOLOGY STUDIES: CT HEAD WO CONTRAST  Result Date: 05/03/2020 CLINICAL DATA:  Alcohol withdrawal seizure. EXAM: CT HEAD WITHOUT CONTRAST CT CERVICAL SPINE WITHOUT CONTRAST TECHNIQUE: Multidetector CT imaging of the head and cervical spine was performed following the standard protocol without intravenous contrast. Multiplanar CT image reconstructions of the cervical spine were also generated. COMPARISON:  None. FINDINGS: CT HEAD FINDINGS  Brain: There is no mass, hemorrhage or extra-axial collection. The size and configuration of the ventricles and extra-axial CSF spaces are normal. The brain parenchyma is normal, without evidence of acute or chronic infarction. Vascular: No abnormal hyperdensity of the major intracranial arteries or dural venous sinuses. No intracranial atherosclerosis. Skull: The visualized skull base, calvarium and extracranial soft tissues are normal. Sinuses/Orbits: Intermediate density secretions within the right maxillary sinus. The orbits are normal. CT CERVICAL SPINE FINDINGS Alignment: No static subluxation. Facets are aligned. Occipital condyles are normally positioned. Skull base and vertebrae: No acute fracture. Soft tissues and spinal canal: No prevertebral fluid or swelling. No visible canal hematoma. Disc levels: There is left C6 foraminal narrowing. Upper chest: No pneumothorax, pulmonary nodule or pleural effusion. Other: Normal visualized  paraspinal cervical soft tissues. IMPRESSION: 1. No acute intracranial abnormality. 2. No acute fracture or static subluxation of the cervical spine. Electronically Signed   By: Ulyses Jarred M.D.   On: 05/03/2020 01:37   CT ANGIO CHEST PE W OR WO CONTRAST  Result Date: 05/03/2020 CLINICAL DATA:  Intermediate to low probability for pulmonary embolism. Positive D-dimer and syncope. EXAM: CT ANGIOGRAPHY CHEST WITH CONTRAST TECHNIQUE: Multidetector CT imaging of the chest was performed using the standard protocol during bolus administration of intravenous contrast. Multiplanar CT image reconstructions and MIPs were obtained to evaluate the vascular anatomy. CONTRAST:  176m OMNIPAQUE IOHEXOL 350 MG/ML SOLN COMPARISON:  06/01/2018 FINDINGS: Cardiovascular: Satisfactory opacification of the pulmonary arteries to the segmental level. No evidence of pulmonary embolism. Normal heart size. No pericardial effusion. Mediastinum/Nodes: Negative for adenopathy or mass. Lungs/Pleura:  Generalized airway thickening. A small pulmonary cyst is seen at the left base. There is no edema, consolidation, effusion, or pneumothorax. Upper Abdomen: Severe liver steatosis. Musculoskeletal: Nonacute anterior bilateral rib fractures. Review of the MIP images confirms the above findings. IMPRESSION: 1. Negative for pulmonary embolism or other acute finding. 2. Generalized bronchitic airway thickening. 3. Severe hepatic steatosis. Electronically Signed   By: JMonte FantasiaM.D.   On: 05/03/2020 05:18   CT CERVICAL SPINE WO CONTRAST  Result Date: 05/03/2020 CLINICAL DATA:  Alcohol withdrawal seizure. EXAM: CT HEAD WITHOUT CONTRAST CT CERVICAL SPINE WITHOUT CONTRAST TECHNIQUE: Multidetector CT imaging of the head and cervical spine was performed following the standard protocol without intravenous contrast. Multiplanar CT image reconstructions of the cervical spine were also generated. COMPARISON:  None. FINDINGS: CT HEAD FINDINGS Brain: There is no mass, hemorrhage or extra-axial collection. The size and configuration of the ventricles and extra-axial CSF spaces are normal. The brain parenchyma is normal, without evidence of acute or chronic infarction. Vascular: No abnormal hyperdensity of the major intracranial arteries or dural venous sinuses. No intracranial atherosclerosis. Skull: The visualized skull base, calvarium and extracranial soft tissues are normal. Sinuses/Orbits: Intermediate density secretions within the right maxillary sinus. The orbits are normal. CT CERVICAL SPINE FINDINGS Alignment: No static subluxation. Facets are aligned. Occipital condyles are normally positioned. Skull base and vertebrae: No acute fracture. Soft tissues and spinal canal: No prevertebral fluid or swelling. No visible canal hematoma. Disc levels: There is left C6 foraminal narrowing. Upper chest: No pneumothorax, pulmonary nodule or pleural effusion. Other: Normal visualized paraspinal cervical soft tissues.  IMPRESSION: 1. No acute intracranial abnormality. 2. No acute fracture or static subluxation of the cervical spine. Electronically Signed   By: KUlyses JarredM.D.   On: 05/03/2020 01:37   [place]domen Complete  Result Date: 05/03/2020 CLINICAL DATA:  Right-sided pain EXAM: ABDOMEN ULTRASOUND COMPLETE COMPARISON:  Abdominal ultrasound October 06, 2019 FINDINGS: Gallbladder: Sludge is present in the gallbladder. There are no gallstones, gallbladder wall thickening, or pericholecystic fluid. No sonographic Murphy sign noted by sonographer. Common bile duct: Diameter: 4 mm. No intrahepatic or extrahepatic biliary duct dilatation. Liver: No focal lesion identified. Liver echogenicity is increased diffusely. Portal vein is patent on color Doppler imaging with normal direction of blood flow towards the liver. IVC: No abnormality visualized. Pancreas: Visualized portion unremarkable. Portions of pancreas obscured by gas. Spleen: Size and appearance within normal limits. Right Kidney: Length: 12.3 cm. Echogenicity within normal limits. No mass or hydronephrosis visualized. Left Kidney: Length: 12.7 cm. Echogenicity within normal limits. No mass or hydronephrosis visualized. Abdominal aorta: No aneurysm visualized. Other findings: No evident ascites. IMPRESSION: 1.  Sludge in gallbladder. No evident gallstones, gallbladder wall thickening, or pericholecystic fluid. 2. Diffuse increase in liver echogenicity, a finding indicative of hepatic steatosis. No focal liver lesions are evident; it must be cautioned that the sensitivity of ultrasound for detection of focal liver lesions is diminished in this circumstance. 3. Portions of pancreas obscured by gas. Visualized portions of pancreas appear unremarkable. 4.  Study otherwise unremarkable. Electronically Signed   By: Lowella Grip III M.D.   On: 05/03/2020 10:08   DG Chest Port 1 View  Result Date: 05/03/2020 CLINICAL DATA:  Edema possible seizure short of breath EXAM:  PORTABLE CHEST 1 VIEW COMPARISON:  10/05/2019 FINDINGS: The heart size and mediastinal contours are within normal limits. Both lungs are clear. The visualized skeletal structures are unremarkable. IMPRESSION: No active disease. Electronically Signed   By: Donavan Foil M.D.   On: 05/03/2020 00:25   EEG adult  Result Date: 05/03/2020 Lora Havens, MD     05/03/2020 11:55 AM Patient Name: [name] MRN: [id number] Epilepsy Attending: Lora Havens Referring Physician/Provider: Dr Harrold Donath Date: 05/03/2020 Duration: 24.37 mins Patient history: 49yo m with syncope. EEG to evaluate for seizure. Level of alertness: Awake, asleep AEDs during EEG study: Lorazepam Technical aspects: This EEG study was done with scalp electrodes positioned according to the 10-20 International system of electrode placement. Electrical activity was acquired at a sampling rate of 500Hz and reviewed with a high frequency filter of 70Hz and a low frequency filter of 1Hz. EEG data were recorded continuously and digitally stored. Description: No posterior dominant rhythm was seen. Sleep was characterized by vertex waves, sleep spindles (12 to 14 Hz), maximal frontocentral region.  There is an excessive amount of 15 to 18 Hz beta activity with irregular morphology distributed symmetrically and diffusely. Physiologic photic driving was seen during photic stimulation.  Hyperventilation was not performed.   ABNORMALITY -Excessive beta, generalized IMPRESSION: This study is within normal limits. No seizures or epileptiform discharges were seen throughout the recording. The excessive beta activity seen in the background is most likely due to the effect of benzodiazepine and is a benign EEG pattern. Priyanka [name]     IMPRESSION:   *   Chronic ETOH hepatitis.  Hepatic steatosis but no ascites or cirrhosis per ultrasound, ct scans but coould very well have cirrhosis.    *   EToH dependence, Librium tid, CIWA in place.      *   [place] anemia.  Low folate.  Low TIBC.  B12, iron, iron sats, ferritin ok.    Folic acid and thiamine po in place now, none PTA.      PLAN:     *   PT INR w next lab draw at 1230 so we can calculate MELD and DF score.   *   EGD for tmrw, pt agreeable.    *   Advanced to [place] diet, npo after mn.    *  Ordered Protonix 40 mg/day.    Azucena Freed  05/04/2020, 10:49 AM Phone [phone]   Attending physician's note   I have taken a history, examined the patient and reviewed the chart. I agree with the Advanced Practitioner's note, impression and recommendations.  49 year old male with history of depression, anxiety, schizoaffective disorder, chronic alcohol abuse admitted with worsening anemia, alcohol withdrawal and acute alcoholic hepatitis  Multiple hospitalizations in the past for alcohol-related issues  History of intermittent melena  He uses NSAIDs intermittently  Await PT and INR  to calculate meld and Discriminate function score  He has mild leukocytosis, hold off starting prednisolone even if discriminant function is greater than 32 until all infectious work-up is negative  He had normal PT/ INR in February 2021  Severe anemia with hemoglobin 6.5, no overt bleeding acutely He is getting transfused, avoid over transfusion greater than 7  Continue PPI  Severe electrolyte dysfunction with potassium 2.3, getting repletions  Monitor for alcohol withdrawal  We will tentatively plan for EGD tomorrow a.m. to exclude any high risk lesions, esophageal varices, erosive gastroduodenitis or peptic ulcer disease The risks and benefits as well as alternatives of endoscopic procedure(s) have been discussed and reviewed. All questions answered. The patient agrees to proceed.    The patient was provided an opportunity to ask questions and all were answered. The patient agreed with the plan and demonstrated an understanding of the instructions.  Damaris Hippo ,  MD [phone]

## 2020-05-04 NOTE — Consult Note (Addendum)
                                                                           [facility] Consult: 10:49 AM 05/04/2020  LOS: 1 day    Referring Provider: Dr Rai Primary Care Physician:  Wallace, Catherine Lauren, DO Primary Gastroenterologist:  Unassigned pt   Reason for Consultation:  Anemia.  ETOH liver dz.     HPI: [name] is a 49 y.o. male.  Pmh Chronci ETOH  Aabuse, dependence.  Depression, anxiety, PTSD, schizoaffective d/o.  Non-compliant w psych meds. Seizures due to ETOH 02/2019.  Rhabdo 02/2019.  GERD.  Htn. Grade 1 DD and LVEF 60% on Echo 09/2019. No prior upper or lower endoscopy.  ETOH hepatitis, dates to at least 02/2014 and hepatic steatosis seen on ultrasound then as well as later CTs and ultrasounds of 2017, 2019, 2021. Has never had ascites on imaging.   Acute hepatitis serologies were negative in 12/2018.   Presented to ED 2 d ago and admitted w seizure vs syncope at home Lasts ETOH was five, 24 oz cans of "4Loco" on 9/15.   Per google search: "Four Loko contains carbonated water, sugar ...  is either 6%, 7%, 8%, 10%,12% or 14% alcohol by volume (ABV), depending on state regulations... packaged in 23.5 oz (695 mL) cans."  Occasional emesis, vomited AM 9/15, no blood or CG.  Intermittent black but not tarry stools.  No abdominal pain.  Uses Aleve a few times a week. Denies icterus, jaundice. Urine has been dark for a few days.  Denies excessive or unusual bleeding or bruising.  Compliant w Protonix 40 mg daily in addition to daily milk thistle.  K this AM 2.3.  Na 130 (129 nadir yesterday).   t bili 9.5.  Alk phos 241.  AST/ALT 241/84.  Albumin 1.7 Creat 0.7.    INR not assayed.  Hgb 8.2 >> 6.5, was 11.2 on 11/23/19.  MCV 95.  platlets 281.    1 U PRBC ordered.    Ultrasound 05/02/20 w fatty liver, GB sludge, 4 mm CBD, patent PV w normal directional flow.     Smokes 30 cigs daily.  occ smokes mja, last use 2 weeks ago.   Disabled, lives alone in an appartment. Pat GF had ETOH cirrhosis and emphysema.    Past Medical History:  Diagnosis Date  . Alcohol abuse   . Anxiety    at age 21  . Depression    at age 21  . GERD (gastroesophageal reflux disease)   . Hypertension   . Psychosis (HCC)   . PTSD (post-traumatic stress disorder)   . Schizoaffective disorder   . Seizure disorder (HCC)    related to etoh seizure    Past Surgical History:  Procedure Laterality Date  . FOOT SURGERY     right  . HAND SURGERY      Prior to Admission medications   Medication Sig Start Date End Date Taking? Authorizing Provider  allopurinol (ZYLOPRIM) 100 MG tablet Take 2 tablets (200 mg total) by mouth daily. 04/20/20  Yes Wallace, Catherine Lauren, DO  amLODipine (NORVASC) 10 MG tablet Take 1 tablet (10 mg total) by mouth daily. 04/20/20  Yes   Wallace, Catherine Lauren, DO  loperamide (IMODIUM) 2 MG capsule Take 4 mg by mouth as needed for diarrhea or loose stools.   Yes [provider]  metoprolol tartrate (LOPRESSOR) 25 MG tablet Take 0.5 tablets (12.5 mg total) by mouth 2 (two) times daily. 04/20/20  Yes Wallace, Catherine Lauren, DO  MILK THISTLE PO Take 1 capsule by mouth daily.   Yes [provider]  Misc. Devices MISC Blood Pressure monitor. Dx :Hypertension 06/09/19  Yes Newlin, Enobong, MD  pantoprazole (PROTONIX) 40 MG tablet Take 1 tablet (40 mg total) by mouth daily. 04/20/20  Yes Wallace, Catherine Lauren, DO  chlordiazePOXIDE (LIBRIUM) 25 MG capsule 50mg PO TID x 1D, then 25-50mg PO BID X 1D, then 25-50mg PO QD X 1D Patient not taking: Reported on 05/03/2020 11/23/19   Knapp, Jon, MD  feeding supplement, ENSURE ENLIVE, (ENSURE ENLIVE) LIQD Take 237 mLs by mouth 2 (two) times daily between meals. Patient not taking: Reported on 10/18/2019 10/07/19   Steen, James, MD  folic acid (FOLVITE) 1 MG tablet Take 1 tablet (1 mg total) by mouth  daily. Patient not taking: Reported on 05/03/2020 11/23/19   Knapp, Jon, MD  ondansetron (ZOFRAN ODT) 8 MG disintegrating tablet Take 1 tablet (8 mg total) by mouth every 8 (eight) hours as needed for nausea or vomiting. Patient not taking: Reported on 05/03/2020 11/23/19   Knapp, Jon, MD    Scheduled Meds: . sodium chloride   Intravenous Once  . amLODipine  10 mg Oral Daily  . chlordiazePOXIDE  25 mg Oral TID  . folic acid  1 mg Oral Daily  . LORazepam  0-4 mg Intravenous Q6H   Or  . LORazepam  0-4 mg Oral Q6H  . [START ON 05/05/2020] LORazepam  0-4 mg Intravenous Q12H   Or  . [START ON 05/05/2020] LORazepam  0-4 mg Oral Q12H  . metoprolol tartrate  12.5 mg Oral BID  . multivitamin with minerals  1 tablet Oral Daily  . nicotine  21 mg Transdermal Daily  . potassium chloride  40 mEq Oral Q4H  . sodium chloride flush  3 mL Intravenous Q12H  . thiamine  100 mg Oral Daily   Or  . thiamine  100 mg Intravenous Daily  . thiamine  100 mg Oral Daily   Infusions: . sodium chloride    . potassium chloride 10 mEq (05/04/20 1027)   PRN Meds: sodium chloride, ipratropium-albuterol, prochlorperazine, sodium chloride flush   Allergies as of 05/02/2020 - Review Complete 05/02/2020  Allergen Reaction Noted  . Wellbutrin [bupropion] Other (See Comments) 10/18/2014    Family History  Problem Relation Age of Onset  . Alcoholism Mother   . CAD Father   . Depression Brother     Social History   Socioeconomic History  . Marital status: Divorced    Spouse name: Not on file  . Number of children: Not on file  . Years of education: Not on file  . Highest education level: Not on file  Occupational History  . Not on file  Tobacco Use  . Smoking status: Current Every Day Smoker    Packs/day: 1.50    Years: 30.00    Pack years: 45.00    Types: Cigarettes  . Smokeless tobacco: Never Used  Vaping Use  . Vaping Use: Every day  Substance and Sexual Activity  . Alcohol use: Yes     Alcohol/week: 0.0 standard drinks    Comment: drinking daily   . Drug use: Yes      Frequency: 1.0 times per week    Types: Marijuana    Comment: THC 2 to 3 times per month. last use 09/24/2015  . Sexual activity: Yes    Birth control/protection: Condom  Other Topics Concern  . Not on file  Social History Narrative  . Not on file   Social Determinants of Health   Financial Resource Strain:   . Difficulty of Paying Living Expenses: Not on file  Food Insecurity: No Food Insecurity  . Worried About Running Out of Food in the Last Year: Never true  . Ran Out of Food in the Last Year: Never true  Transportation Needs: Unmet Transportation Needs  . Lack of Transportation (Medical): Yes  . Lack of Transportation (Non-Medical): Yes  Physical Activity:   . Days of Exercise per Week: Not on file  . Minutes of Exercise per Session: Not on file  Stress:   . Feeling of Stress : Not on file  Social Connections:   . Frequency of Communication with Friends and Family: Not on file  . Frequency of Social Gatherings with Friends and Family: Not on file  . Attends Religious Services: Not on file  . Active Member of Clubs or Organizations: Not on file  . Attends Club or Organization Meetings: Not on file  . Marital Status: Not on file  Intimate Partner Violence:   . Fear of Current or Ex-Partner: Not on file  . Emotionally Abused: Not on file  . Physically Abused: Not on file  . Sexually Abused: Not on file    REVIEW OF SYSTEMS: Constitutional:  Fatigue, weakness ENT:  No nose bleeds Pulm:  No SOB, no cough CV:  No palpitations, no LE edema.  GU:  No hematuria, no frequency GI:  See HPI Heme:  Denies unusual bleeding or bruising. Transfusions: None prior to today. Neuro: Syncope versus seizure at home no headaches, no peripheral tingling or numbness Derm:  No itching, no rash or sores.  Endocrine:  No sweats or chills.  No polyuria or dysuria Immunization: Not queried Travel:  None  beyond local counties in last few months.    PHYSICAL EXAM: Vital signs in last 24 hours: Vitals:   05/03/20 2025 05/04/20 0521  BP: 126/68 118/64  Pulse: 93 (!) 103  Resp: 16 18  Temp: 98.3 F (36.8 C) 98.4 F (36.9 C)  SpO2: 100% 95%   Wt Readings from Last 3 Encounters:  05/03/20 105.4 kg  11/23/19 99.8 kg  10/07/19 109.7 kg    General: Slightly tremulous, slightly diaphoretic, somewhat disheveled, sallow but not obviously jaundiced coloring.  Looks ill and bloated Head: Some facial swelling.  No signs of head trauma. Eyes: Scleral icterus.  No conjunctival pallor. Ears: Not hard of hearing Nose: No congestion or discharge Mouth: Poor dentition.  75% or more of his teeth are absent.  The remaining teeth are riddled with caries.  Mucosa is moist, pink, clear.  Tongue is midline. Neck: No JVD, no masses, no thyromegaly Lungs: Diminished breath sounds in the setting of poor inspiratory effort.  No adventitious sounds.  No labored breathing or cough. Heart: RRR.  No MRG.  S1, S2 present Abdomen: Obese, moderately distended.  Not tender.  Bowel sounds hypoactive but no tinkling/rushing or high-pitched bowel sounds.   Rectal: Deferred Musc/Skeltl: No joint redness, swelling or gross deformities Extremities: No CCE. Neurologic: Alert.  Oriented x3.  Tremulous but no asterixis. Skin: No rash, no sores, no telangiectasia. Tattoos: Extensive professional tattoos on the right arm.   Nodes: No cervical adenopathy Psych: Paucity of speech, but does answer questions appropriately.  Cooperative, not agitated.  Intake/Output from previous day: 09/16 0701 - 09/17 0700 In: 1255 [P.O.:480; I.V.:775] Out: 800 [Urine:800] Intake/Output this shift: No intake/output data recorded.  LAB RESULTS: Recent Labs    05/03/20 0005 05/03/20 0420 05/04/20 0553  WBC 11.3* 10.2 13.2*  HGB 8.2* 7.4* 6.5*  HCT 23.5* 20.9* 18.2*  PLT 384 341 281   BMET Lab Results  Component Value Date   NA  130 (L) 05/04/2020   NA 131 (L) 05/03/2020   NA 129 (L) 05/03/2020   K 2.3 (LL) 05/04/2020   K 4.1 05/03/2020   K <2.0 (LL) 05/03/2020   CL 82 (L) 05/04/2020   CL 77 (L) 05/03/2020   CL 73 (L) 05/03/2020   CO2 43 (H) 05/04/2020   CO2 35 (H) 05/03/2020   CO2 35 (H) 05/03/2020   GLUCOSE 117 (H) 05/04/2020   GLUCOSE 121 (H) 05/03/2020   GLUCOSE 127 (H) 05/03/2020   BUN 11 05/04/2020   BUN 6 05/03/2020   BUN 6 05/03/2020   CREATININE 0.75 05/04/2020   CREATININE 0.62 05/03/2020   CREATININE 0.72 05/03/2020   CALCIUM 7.8 (L) 05/04/2020   CALCIUM 7.4 (L) 05/03/2020   CALCIUM 7.6 (L) 05/03/2020   LFT Recent Labs    05/03/20 0005 05/03/20 0420 05/04/20 0553  PROT 5.4* 5.0* 5.0*  ALBUMIN 1.9* 1.8* 1.7*  AST 233* 192* 241*  ALT 94* 80* 84*  ALKPHOS 152* 144* 136*  BILITOT 7.6* 6.7* 9.5*  BILIDIR 4.2*  --   --   IBILI 3.4*  --   --    PT/INR Lab Results  Component Value Date   INR 0.9 10/05/2019   INR 1.0 03/06/2019   INR 1.0 03/02/2019   Hepatitis Panel No results for input(s): HEPBSAG, HCVAB, HEPAIGM, HEPBIGM in the last 72 hours. C-Diff No components found for: CDIFF Lipase     Component Value Date/Time   LIPASE 25 04/06/2016 1040    Drugs of Abuse     Component Value Date/Time   LABOPIA NONE DETECTED 05/03/2020 0733   COCAINSCRNUR NONE DETECTED 05/03/2020 0733   LABBENZ NONE DETECTED 05/03/2020 0733   AMPHETMU NONE DETECTED 05/03/2020 0733   THCU POSITIVE (A) 05/03/2020 0733   LABBARB NONE DETECTED 05/03/2020 0733     RADIOLOGY STUDIES: CT HEAD WO CONTRAST  Result Date: 05/03/2020 CLINICAL DATA:  Alcohol withdrawal seizure. EXAM: CT HEAD WITHOUT CONTRAST CT CERVICAL SPINE WITHOUT CONTRAST TECHNIQUE: Multidetector CT imaging of the head and cervical spine was performed following the standard protocol without intravenous contrast. Multiplanar CT image reconstructions of the cervical spine were also generated. COMPARISON:  None. FINDINGS: CT HEAD FINDINGS  Brain: There is no mass, hemorrhage or extra-axial collection. The size and configuration of the ventricles and extra-axial CSF spaces are normal. The brain parenchyma is normal, without evidence of acute or chronic infarction. Vascular: No abnormal hyperdensity of the major intracranial arteries or dural venous sinuses. No intracranial atherosclerosis. Skull: The visualized skull base, calvarium and extracranial soft tissues are normal. Sinuses/Orbits: Intermediate density secretions within the right maxillary sinus. The orbits are normal. CT CERVICAL SPINE FINDINGS Alignment: No static subluxation. Facets are aligned. Occipital condyles are normally positioned. Skull base and vertebrae: No acute fracture. Soft tissues and spinal canal: No prevertebral fluid or swelling. No visible canal hematoma. Disc levels: There is left C6 foraminal narrowing. Upper chest: No pneumothorax, pulmonary nodule or pleural effusion. Other: Normal visualized   paraspinal cervical soft tissues. IMPRESSION: 1. No acute intracranial abnormality. 2. No acute fracture or static subluxation of the cervical spine. Electronically Signed   By: Kevin  Herman M.D.   On: 05/03/2020 01:37   CT ANGIO CHEST PE W OR WO CONTRAST  Result Date: 05/03/2020 CLINICAL DATA:  Intermediate to low probability for pulmonary embolism. Positive D-dimer and syncope. EXAM: CT ANGIOGRAPHY CHEST WITH CONTRAST TECHNIQUE: Multidetector CT imaging of the chest was performed using the standard protocol during bolus administration of intravenous contrast. Multiplanar CT image reconstructions and MIPs were obtained to evaluate the vascular anatomy. CONTRAST:  100mL OMNIPAQUE IOHEXOL 350 MG/ML SOLN COMPARISON:  06/01/2018 FINDINGS: Cardiovascular: Satisfactory opacification of the pulmonary arteries to the segmental level. No evidence of pulmonary embolism. Normal heart size. No pericardial effusion. Mediastinum/Nodes: Negative for adenopathy or mass. Lungs/Pleura:  Generalized airway thickening. A small pulmonary cyst is seen at the left base. There is no edema, consolidation, effusion, or pneumothorax. Upper Abdomen: Severe liver steatosis. Musculoskeletal: Nonacute anterior bilateral rib fractures. Review of the MIP images confirms the above findings. IMPRESSION: 1. Negative for pulmonary embolism or other acute finding. 2. Generalized bronchitic airway thickening. 3. Severe hepatic steatosis. Electronically Signed   By: Jonathon  Watts M.D.   On: 05/03/2020 05:18   CT CERVICAL SPINE WO CONTRAST  Result Date: 05/03/2020 CLINICAL DATA:  Alcohol withdrawal seizure. EXAM: CT HEAD WITHOUT CONTRAST CT CERVICAL SPINE WITHOUT CONTRAST TECHNIQUE: Multidetector CT imaging of the head and cervical spine was performed following the standard protocol without intravenous contrast. Multiplanar CT image reconstructions of the cervical spine were also generated. COMPARISON:  None. FINDINGS: CT HEAD FINDINGS Brain: There is no mass, hemorrhage or extra-axial collection. The size and configuration of the ventricles and extra-axial CSF spaces are normal. The brain parenchyma is normal, without evidence of acute or chronic infarction. Vascular: No abnormal hyperdensity of the major intracranial arteries or dural venous sinuses. No intracranial atherosclerosis. Skull: The visualized skull base, calvarium and extracranial soft tissues are normal. Sinuses/Orbits: Intermediate density secretions within the right maxillary sinus. The orbits are normal. CT CERVICAL SPINE FINDINGS Alignment: No static subluxation. Facets are aligned. Occipital condyles are normally positioned. Skull base and vertebrae: No acute fracture. Soft tissues and spinal canal: No prevertebral fluid or swelling. No visible canal hematoma. Disc levels: There is left C6 foraminal narrowing. Upper chest: No pneumothorax, pulmonary nodule or pleural effusion. Other: Normal visualized paraspinal cervical soft tissues.  IMPRESSION: 1. No acute intracranial abnormality. 2. No acute fracture or static subluxation of the cervical spine. Electronically Signed   By: Kevin  Herman M.D.   On: 05/03/2020 01:37   US Abdomen Complete  Result Date: 05/03/2020 CLINICAL DATA:  Right-sided pain EXAM: ABDOMEN ULTRASOUND COMPLETE COMPARISON:  Abdominal ultrasound October 06, 2019 FINDINGS: Gallbladder: Sludge is present in the gallbladder. There are no gallstones, gallbladder wall thickening, or pericholecystic fluid. No sonographic Murphy sign noted by sonographer. Common bile duct: Diameter: 4 mm. No intrahepatic or extrahepatic biliary duct dilatation. Liver: No focal lesion identified. Liver echogenicity is increased diffusely. Portal vein is patent on color Doppler imaging with normal direction of blood flow towards the liver. IVC: No abnormality visualized. Pancreas: Visualized portion unremarkable. Portions of pancreas obscured by gas. Spleen: Size and appearance within normal limits. Right Kidney: Length: 12.3 cm. Echogenicity within normal limits. No mass or hydronephrosis visualized. Left Kidney: Length: 12.7 cm. Echogenicity within normal limits. No mass or hydronephrosis visualized. Abdominal aorta: No aneurysm visualized. Other findings: No evident ascites. IMPRESSION: 1.   Sludge in gallbladder. No evident gallstones, gallbladder wall thickening, or pericholecystic fluid. 2. Diffuse increase in liver echogenicity, a finding indicative of hepatic steatosis. No focal liver lesions are evident; it must be cautioned that the sensitivity of ultrasound for detection of focal liver lesions is diminished in this circumstance. 3. Portions of pancreas obscured by gas. Visualized portions of pancreas appear unremarkable. 4.  Study otherwise unremarkable. Electronically Signed   By: William  Woodruff III M.D.   On: 05/03/2020 10:08   DG Chest Port 1 View  Result Date: 05/03/2020 CLINICAL DATA:  Edema possible seizure short of breath EXAM:  PORTABLE CHEST 1 VIEW COMPARISON:  10/05/2019 FINDINGS: The heart size and mediastinal contours are within normal limits. Both lungs are clear. The visualized skeletal structures are unremarkable. IMPRESSION: No active disease. Electronically Signed   By: Kim  Fujinaga M.D.   On: 05/03/2020 00:25   EEG adult  Result Date: 05/03/2020 Yadav, Priyanka O, MD     05/03/2020 11:55 AM Patient Name: [name] MRN: [id number] Epilepsy Attending: Priyanka O Yadav Referring Physician/Provider: Dr Bradley Chotiner Date: 05/03/2020 Duration: 24.37 mins Patient history: 49yo m with syncope. EEG to evaluate for seizure. Level of alertness: Awake, asleep AEDs during EEG study: Lorazepam Technical aspects: This EEG study was done with scalp electrodes positioned according to the 10-20 International system of electrode placement. Electrical activity was acquired at a sampling rate of 500Hz and reviewed with a high frequency filter of 70Hz and a low frequency filter of 1Hz. EEG data were recorded continuously and digitally stored. Description: No posterior dominant rhythm was seen. Sleep was characterized by vertex waves, sleep spindles (12 to 14 Hz), maximal frontocentral region.  There is an excessive amount of 15 to 18 Hz beta activity with irregular morphology distributed symmetrically and diffusely. Physiologic photic driving was seen during photic stimulation.  Hyperventilation was not performed.   ABNORMALITY -Excessive beta, generalized IMPRESSION: This study is within normal limits. No seizures or epileptiform discharges were seen throughout the recording. The excessive beta activity seen in the background is most likely due to the effect of benzodiazepine and is a benign EEG pattern. Priyanka O Yadav     IMPRESSION:   *   Chronic ETOH hepatitis.  Hepatic steatosis but no ascites or cirrhosis per ultrasound, ct scans but coould very well have cirrhosis.    *   EToH dependence, Librium tid, CIWA in place.      *   [place] anemia.  Low folate.  Low TIBC.  B12, iron, iron sats, ferritin ok.    Folic acid and thiamine po in place now, none PTA.      PLAN:     *   PT INR w next lab draw at 1230 so we can calculate MELD and DF score.   *   EGD for tmrw, pt agreeable.    *   Advanced to [place] diet, npo after mn.    *  Ordered Protonix 40 mg/day.    Sarah Gribbin  05/04/2020, 10:49 AM Phone [phone]   Attending physician's note   I have taken a history, examined the patient and reviewed the chart. I agree with the Advanced Practitioner's note, impression and recommendations.  49-year-old male with history of depression, anxiety, schizoaffective disorder, chronic alcohol abuse admitted with worsening anemia, alcohol withdrawal and acute alcoholic hepatitis  Multiple hospitalizations in the past for alcohol-related issues  History of intermittent melena  He uses NSAIDs intermittently  Await PT and INR   to calculate meld and Discriminate function score  He has mild leukocytosis, hold off starting prednisolone even if discriminant function is greater than 32 until all infectious work-up is negative  He had normal PT/ INR in February 2021  Severe anemia with hemoglobin 6.5, no overt bleeding acutely He is getting transfused, avoid over transfusion greater than 7  Continue PPI  Severe electrolyte dysfunction with potassium 2.3, getting repletions  Monitor for alcohol withdrawal  We will tentatively plan for EGD tomorrow a.m. to exclude any high risk lesions, esophageal varices, erosive gastroduodenitis or peptic ulcer disease The risks and benefits as well as alternatives of endoscopic procedure(s) have been discussed and reviewed. All questions answered. The patient agrees to proceed.    The patient was provided an opportunity to ask questions and all were answered. The patient agreed with the plan and demonstrated an understanding of the instructions.  K. Veena Delynda Sepulveda ,  MD [phone]     

## 2020-05-05 ENCOUNTER — Inpatient Hospital Stay (HOSPITAL_COMMUNITY): Payer: Medicare Other | Admitting: Anesthesiology

## 2020-05-05 ENCOUNTER — Encounter (HOSPITAL_COMMUNITY)
Admission: EM | Disposition: A | Discharge status: Home / Self Care | Payer: Self-pay | Source: Home / Self Care | Attending: Internal Medicine

## 2020-05-05 ENCOUNTER — Encounter (HOSPITAL_COMMUNITY): Payer: Self-pay | Admitting: Family Medicine

## 2020-05-05 DIAGNOSIS — R9431 Abnormal electrocardiogram [ECG] [EKG]: Secondary | ICD-10-CM

## 2020-05-05 DIAGNOSIS — K7031 Alcoholic cirrhosis of liver with ascites: Secondary | ICD-10-CM

## 2020-05-05 DIAGNOSIS — K3189 Other diseases of stomach and duodenum: Secondary | ICD-10-CM

## 2020-05-05 DIAGNOSIS — E871 Hypo-osmolality and hyponatremia: Secondary | ICD-10-CM

## 2020-05-05 DIAGNOSIS — K921 Melena: Secondary | ICD-10-CM | POA: Diagnosis present

## 2020-05-05 DIAGNOSIS — K766 Portal hypertension: Secondary | ICD-10-CM

## 2020-05-05 DIAGNOSIS — I8510 Secondary esophageal varices without bleeding: Secondary | ICD-10-CM

## 2020-05-05 DIAGNOSIS — F488 Other specified nonpsychotic mental disorders: Secondary | ICD-10-CM

## 2020-05-05 DIAGNOSIS — R7989 Other specified abnormal findings of blood chemistry: Secondary | ICD-10-CM

## 2020-05-05 DIAGNOSIS — F17210 Nicotine dependence, cigarettes, uncomplicated: Secondary | ICD-10-CM

## 2020-05-05 DIAGNOSIS — K760 Fatty (change of) liver, not elsewhere classified: Secondary | ICD-10-CM | POA: Diagnosis present

## 2020-05-05 DIAGNOSIS — D539 Nutritional anemia, unspecified: Secondary | ICD-10-CM | POA: Diagnosis present

## 2020-05-05 DIAGNOSIS — D649 Anemia, unspecified: Secondary | ICD-10-CM

## 2020-05-05 HISTORY — PX: ESOPHAGOGASTRODUODENOSCOPY (EGD) WITH PROPOFOL: SHX5813

## 2020-05-05 LAB — COMPREHENSIVE METABOLIC PANEL
ALT: 83 U/L — ABNORMAL HIGH (ref 0–44)
AST: 236 U/L — ABNORMAL HIGH (ref 15–41)
Albumin: 1.7 g/dL — ABNORMAL LOW (ref 3.5–5.0)
Alkaline Phosphatase: 133 U/L — ABNORMAL HIGH (ref 38–126)
Anion gap: 9 (ref 5–15)
BUN: 5 mg/dL — ABNORMAL LOW (ref 6–20)
CO2: 38 mmol/L — ABNORMAL HIGH (ref 22–32)
Calcium: 7.9 mg/dL — ABNORMAL LOW (ref 8.9–10.3)
Chloride: 87 mmol/L — ABNORMAL LOW (ref 98–111)
Creatinine, Ser: 0.64 mg/dL (ref 0.61–1.24)
GFR calc Af Amer: >60 mL/min (ref >60)
GFR calc non Af Amer: >60 mL/min (ref >60)
Glucose, Bld: 100 mg/dL — ABNORMAL HIGH (ref 70–99)
Potassium: 3.1 mmol/L — ABNORMAL LOW (ref 3.5–5.1)
Sodium: 134 mmol/L — ABNORMAL LOW (ref 135–145)
Total Bilirubin: 8.7 mg/dL — ABNORMAL HIGH (ref 0.3–1.2)
Total Protein: 4.8 g/dL — ABNORMAL LOW (ref 6.5–8.1)

## 2020-05-05 LAB — MAGNESIUM: Magnesium: 2 mg/dL (ref 1.7–2.4)

## 2020-05-05 LAB — HEMOGLOBIN AND HEMATOCRIT, BLOOD
HCT: 24.1 % — ABNORMAL LOW (ref 39.0–52.0)
Hemoglobin: 8.2 g/dL — ABNORMAL LOW (ref 13.0–17.0)

## 2020-05-05 LAB — CBC
HCT: 20.3 % — ABNORMAL LOW (ref 39.0–52.0)
Hemoglobin: 7 g/dL — ABNORMAL LOW (ref 13.0–17.0)
MCH: 32.4 pg (ref 26.0–34.0)
MCHC: 34.5 g/dL (ref 30.0–36.0)
MCV: 94 fL (ref 80.0–100.0)
Platelets: 215 10*3/uL (ref 150–400)
RBC: 2.16 MIL/uL — ABNORMAL LOW (ref 4.22–5.81)
RDW: 23.8 % — ABNORMAL HIGH (ref 11.5–15.5)
WBC: 13.6 10*3/uL — ABNORMAL HIGH (ref 4.0–10.5)
nRBC: 0.4 % — ABNORMAL HIGH (ref 0.0–0.2)

## 2020-05-05 LAB — POTASSIUM: Potassium: 3.2 mmol/L — ABNORMAL LOW (ref 3.5–5.1)

## 2020-05-05 SURGERY — ESOPHAGOGASTRODUODENOSCOPY (EGD) WITH PROPOFOL
Anesthesia: Monitor Anesthesia Care

## 2020-05-05 MED ORDER — POTASSIUM CHLORIDE 10 MEQ/100ML IV SOLN: 10.0000 meq | INTRAVENOUS | Status: DC

## 2020-05-05 MED ORDER — POTASSIUM CHLORIDE 10 MEQ/100ML IV SOLN
10.0000 meq | INTRAVENOUS | Status: AC
  Administered 2020-05-05 (×3): 10 meq via INTRAVENOUS
  Filled 2020-05-05 (×5): qty 100

## 2020-05-05 MED ORDER — PHENYLEPHRINE 40 MCG/ML (10ML) SYRINGE FOR IV PUSH (FOR BLOOD PRESSURE SUPPORT)
PREFILLED_SYRINGE | INTRAVENOUS | Status: DC | PRN
  Administered 2020-05-05: 40 ug via INTRAVENOUS

## 2020-05-05 MED ORDER — LACTATED RINGERS IV SOLN: INTRAVENOUS | Status: DC | PRN

## 2020-05-05 MED ORDER — SODIUM CHLORIDE 0.9% IV SOLUTION: Freq: Once | INTRAVENOUS | Status: AC

## 2020-05-05 MED ORDER — QUETIAPINE FUMARATE 50 MG PO TABS
50.0000 mg | ORAL_TABLET | Freq: Every day | ORAL | Status: DC
  Administered 2020-05-05 – 2020-05-06 (×2): 50 mg via ORAL
  Filled 2020-05-05 (×2): qty 1

## 2020-05-05 MED ORDER — PROPOFOL 500 MG/50ML IV EMUL
INTRAVENOUS | Status: DC | PRN
  Administered 2020-05-05: 100 ug/kg/min via INTRAVENOUS

## 2020-05-05 MED ORDER — POTASSIUM CHLORIDE 10 MEQ/100ML IV SOLN
10.0000 meq | INTRAVENOUS | Status: AC
  Administered 2020-05-05 (×3): 10 meq via INTRAVENOUS
  Filled 2020-05-05 (×3): qty 100

## 2020-05-05 MED ORDER — SERTRALINE HCL 50 MG PO TABS
25.0000 mg | ORAL_TABLET | Freq: Every day | ORAL | Status: DC
  Administered 2020-05-05 – 2020-05-07 (×3): 25 mg via ORAL
  Filled 2020-05-05 (×3): qty 1

## 2020-05-05 MED ORDER — POTASSIUM CHLORIDE 10 MEQ/100ML IV SOLN
10.0000 meq | INTRAVENOUS | Status: DC
  Administered 2020-05-05: 10 meq via INTRAVENOUS
  Filled 2020-05-05: qty 100

## 2020-05-05 MED ORDER — POTASSIUM CHLORIDE 10 MEQ/100ML IV SOLN
10.0000 meq | Freq: Once | INTRAVENOUS | Status: DC
  Filled 2020-05-05: qty 100

## 2020-05-05 MED ORDER — PROPOFOL 10 MG/ML IV BOLUS
INTRAVENOUS | Status: DC | PRN
  Administered 2020-05-05 (×4): 20 mg via INTRAVENOUS

## 2020-05-05 SURGICAL SUPPLY — 15 items

## 2020-05-05 NOTE — Progress Notes (Signed)
PROGRESS NOTE    [name]  QQP:[id number] DOB: 1970/12/11 DOA: 05/02/2020 PCP: Nicolette Bang, DO    Brief Narrative:  [name] is a 49 year old male with past medical history notable for EtOH use disorder, essential hypertension, GERD, schizoaffective disorder who presented to the emergency department via EMS following syncopal episode at home.  Patient with history of seizures in the past related to alcohol withdrawal.  Patient reportedly has been drinking heavily with 524 ounce cans of loco the day before admission.  On arrival, patient complaining of headache, neck pain, and also episode of vomiting without hematemesis.  No abdominal pain or hematochezia.  Also endorses smokes 1/2 pack of cigarettes daily for the past 30 years with occasional marijuana use.  In the ED, patient was noted to have alcohol level elevated at 144, sodium 129, potassium level less than 2.  Given syncopal episode, EtOH abuse and significant electrolyte abnormalities, [facility] was consulted for admission.   Assessment & Plan:   Principal Problem:   Syncope Active Problems:   Hypokalemia   Hyponatremia   Tobacco dependence due to cigarettes   Alcoholic hepatitis without ascites   Hypomagnesemia   Hyperbilirubinemia   D-dimer, elevated   Alcoholism (HCC)   Prolonged QT interval   Melena   Symptomatic anemia   Alcoholic cirrhosis of liver with ascites ([place])   Syncopal episode Patient presented to the ED after having syncopal episode at home.  History of seizures from alcohol withdrawal.  Patient was found to be intoxicated following heavy drinking over the past several days from underlying alcohol dependence.  Also notable for severe electrolyte derangements with hyponatremia and potassium less than 2.  UDS positive for THC.  EtOH level 144.  Elevated D-dimer on admission with CT angiogram chest negative for PE.  CT head/C-spine negative for acute intracranial abnormality or  fracture/subluxation.  EEG without seizure or epileptiform discharges.  Etiology likely secondary to severe electrolyte disturbance as well as intoxication. --Continue to monitor on telemetry --Continue electrolyte replacement --Discussed alcohol cessation --Supportive care  Alcoholic hepatitis with esophageal varices EtOH intoxication/dependence Patient with longstanding history of alcohol dependence.  Binge drinking prior to admission.  Was found to have alcohol level elevated to 144.  CT angiogram chest with findings of severe steatosis liver.  LFTs on admission with AST/ALT ratio consistent with alcohol abuse.  Ultrasound abdomen with sludge in gallbladder, no gallstones or gallbladder wall thickening, increased echogenicity indicative of hepatic steatosis but no focal liver lesions evident. EGD 05/05/2020 with grade 1 varices lower third of esophagus, less than 5 mm in largest diameter, mild portal hypertensive gastropathy and mildly erythematous mucosa without active bleeding duodenal bulb. --Continue Protonix 40 mg p.o. daily --Outpatient follow-up with gastroenterology --EtOH cessation --CIWAA protocol with symptom triggered Ativan --Thiamine, folic acid, multivitamin  Hypokalemia, severe Hypomagnesemia Potassium less than 2 on admission, likely secondary to severe alcohol abuse.  Repleted K and Mg aggressively during hospitalization. --K <2, 2.3>2.6>3.2>3.1 --Repeat potassium this afternoon --Continue replacement as needed  Hyponatremia Sodium 129 admission, likely secondary to beer potomania --Na 129>134 --Encourage increased oral intake --Repeat BMP in the a.m.  Anemia Folic acid deficiency Iron 122, TIBC 129, ferritin 2307, folate low at 2.9. --Transfuse 1 unit PRBC on 9/17, will transfuse an additional unit today --Continue folic acid 1 mg p.o. daily --Follow CBC daily  Essential hypertension --Continue metoprolol tartrate 12.5 mg p.o. twice daily  GERD: Continue  PPI  Schizoaffective disorder Psychiatry consulted on 05/03/2020. --Continue Librium 25 mg p.o.  3 times daily --Will start Seroquel 50 mg p.o. nightly and Zoloft 25 mg p.o. daily if potassium stable this afternoon  Tobacco use disorder: Also need for cessation --Nicotine patch   DVT prophylaxis: SCDs Code Status: Full code Family Communication: Updated patient extensively at bedside  Disposition Plan:  Status is: Inpatient  Remains inpatient appropriate because:Persistent severe electrolyte disturbances, Unsafe d/c plan, IV treatments appropriate due to intensity of illness or inability to take PO and Inpatient level of care appropriate due to severity of illness   Dispo: The patient is from: Home              Anticipated d/c is to: Home              Anticipated d/c date is: 1 day              Patient currently is not medically stable to d/c.  Consultants:   [facility]  Psychiatry  Procedures:   EGD 9/18: Grade 1 esophageal varices, portal hypertensive gastropathy, gastritis  Antimicrobials:   None   Subjective: Patient seen and examined bedside, resting comfortably.  Just returned from EGD.  Tolerating diet.  Wants to know when he can restart his antipsychotics.  Discussed with him needs to have his electrolytes stabilized prior to restarting.  Also with hemoglobin 7.0, will transfuse 1 additional unit today.  No other complaints or concerns at this time.  Denies headache, no visual changes, no chest pain, no palpitations, no fever/chills/night sweats, no nausea/vomiting/diarrhea, no abdominal pain, no weakness, no fatigue, no paresthesias.  No acute events overnight per nursing staff.  Objective: Vitals:   05/05/20 0943 05/05/20 1013 05/05/20 1228 05/05/20 1247  BP: 112/75 117/70 103/67 93/62  Pulse: 88 88 79 76  Resp: (!) 31 19 18 17   Temp: (!) 97.4 F (36.3 C) 98.1 F (36.7 C) 98 F (36.7 C) 97.8 F (36.6 C)  TempSrc:  Oral Oral Oral  SpO2: 93% 95% 95% 95%   Weight:      Height:        Intake/Output Summary (Last 24 hours) at 05/05/2020 1249 Last data filed at 05/05/2020 1212 Gross per 24 hour  Intake 942.45 ml  Output 1200 ml  Net -257.55 ml   Filed Weights   05/03/20 2025  Weight: 105.4 kg    Examination:  General exam: Appears calm and comfortable, appears older than stated age Respiratory system: Clear to auscultation. Respiratory effort normal. Cardiovascular system: S1 & S2 heard, RRR. No JVD, murmurs, rubs, gallops or clicks. No pedal edema. Gastrointestinal system: Abdomen is nondistended, soft and nontender. No organomegaly or masses felt. Normal bowel sounds heard. Central nervous system: Alert and oriented. No focal neurological deficits. Extremities: Symmetric 5 x 5 power. Skin: No rashes, lesions or ulcers Psychiatry: Judgement and insight appear normal. Mood & affect appropriate.     Data Reviewed: I have personally reviewed following labs and imaging studies  CBC: Recent Labs  Lab 05/03/20 0005 05/03/20 0420 05/04/20 0553 05/04/20 1515 05/05/20 0313  WBC 11.3* 10.2 13.2*  --  13.6*  NEUTROABS 8.1*  --   --   --   --   HGB 8.2* 7.4* 6.5* 6.8* 7.0*  HCT 23.5* 20.9* 18.2* 19.7* 20.3*  MCV 93.3 93.3 95.3  --  94.0  PLT 384 341 281  --  161   Basic Metabolic Panel: Recent Labs  Lab 05/03/20 0005 05/03/20 0005 05/03/20 0420 05/03/20 0420 05/04/20 0553 05/04/20 1515 05/04/20 1742 05/04/20 2034 05/05/20  0313  NA 129*  --  131*  --  130*  --   --  133* 134*  K <2.0*   < > 4.1   < > 2.3* 2.5* 2.6* 3.2* 3.1*  CL 73*  --  77*  --  82*  --   --  86* 87*  CO2 35*  --  35*  --  43*  --   --  36* 38*  GLUCOSE 127*  --  121*  --  117*  --   --  110* 100*  BUN 6  --  6  --  11  --   --  7 5*  CREATININE 0.72  --  0.62  --  0.75  --   --  0.69 0.64  CALCIUM 7.6*  --  7.4*  --  7.8*  --   --  7.8* 7.9*  MG 1.4*  --   --   --  1.6*  --   --   --  2.0   < > = values in this interval not displayed.    GFR: Estimated Creatinine Clearance: 143.2 mL/min (by C-G formula based on SCr of 0.64 mg/dL). Liver Function Tests: Recent Labs  Lab 05/03/20 0005 05/03/20 0420 05/04/20 0553 05/04/20 2034 05/05/20 0313  AST 233* 192* 241* 248* 236*  ALT 94* 80* 84* 82* 83*  ALKPHOS 152* 144* 136* 125 133*  BILITOT 7.6* 6.7* 9.5* 9.5* 8.7*  PROT 5.4* 5.0* 5.0* 5.0* 4.8*  ALBUMIN 1.9* 1.8* 1.7* 1.7* 1.7*   No results for input(s): LIPASE, AMYLASE in the last 168 hours. Recent Labs  Lab 05/03/20 0005  AMMONIA 46*   Coagulation Profile: Recent Labs  Lab 05/04/20 1515  INR 1.2   Cardiac Enzymes: No results for input(s): CKTOTAL, CKMB, CKMBINDEX, TROPONINI in the last 168 hours. BNP (last 3 results) No results for input(s): PROBNP in the last 8760 hours. HbA1C: No results for input(s): HGBA1C in the last 72 hours. CBG: No results for input(s): GLUCAP in the last 168 hours. Lipid Profile: No results for input(s): CHOL, HDL, LDLCALC, TRIG, CHOLHDL, LDLDIRECT in the last 72 hours. Thyroid Function Tests: Recent Labs    05/03/20 0918  TSH 0.987   Anemia Panel: Recent Labs    05/03/20 0733  VITAMINB12 612  FOLATE 2.9*  FERRITIN 2,307*  TIBC 129*  IRON 122  RETICCTPCT 0.7   Sepsis Labs: No results for input(s): PROCALCITON, LATICACIDVEN in the last 168 hours.  Recent Results (from the past 240 hour(s))  SARS Coronavirus 2 by RT PCR (hospital order, performed in [facility] hospital lab) Nasopharyngeal Nasopharyngeal Swab     Status: None   Collection Time: 05/03/20 12:23 AM   Specimen: Nasopharyngeal Swab  Result Value Ref Range Status   SARS Coronavirus 2 NEGATIVE NEGATIVE Final    Comment: (NOTE) SARS-CoV-2 target nucleic acids are NOT DETECTED.  The SARS-CoV-2 RNA is generally detectable in upper and lower respiratory specimens during the acute phase of infection. The lowest concentration of SARS-CoV-2 viral copies this assay can detect is 250 copies / mL. A  negative result does not preclude SARS-CoV-2 infection and should not be used as the sole basis for treatment or other patient management decisions.  A negative result may occur with improper specimen collection / handling, submission of specimen other than nasopharyngeal swab, presence of viral mutation(s) within the areas targeted by this assay, and inadequate number of viral copies (<250 copies / mL). A negative result must be combined  with clinical observations, patient history, and epidemiological information.  Fact Sheet for Patients:   StrictlyIdeas.no  Fact Sheet for Healthcare Providers: BankingDealers.co.za  This test is not yet approved or  cleared by the [place] FDA and has been authorized for detection and/or diagnosis of SARS-CoV-2 by FDA under an Emergency Use Authorization (EUA).  This EUA will remain in effect (meaning this test can be used) for the duration of the COVID-19 declaration under Section 564(b)(1) of the Act, 21 U.S.C. section 360bbb-3(b)(1), unless the authorization is terminated or revoked sooner.  Performed at [facility], [place] [address] [place], [place] [address]          Radiology Studies: DG CHEST PORT 1 VIEW  Result Date: 05/04/2020 CLINICAL DATA:  Dyspnea EXAM: PORTABLE CHEST 1 VIEW COMPARISON:  05/03/2020 FINDINGS: Mild lingular linear airspace disease likely reflecting atelectasis. No focal consolidation. No pleural effusion or pneumothorax. Heart and mediastinal contours are unremarkable. No acute osseous abnormality. IMPRESSION: No acute cardiopulmonary disease. Electronically Signed   By: Kathreen Devoid   On: 05/04/2020 11:51        Scheduled Meds: . sodium chloride   Intravenous Once  . chlordiazePOXIDE  25 mg Oral TID  . folic acid  1 mg Oral Daily  . LORazepam  0-4 mg Intravenous Q12H   Or  . LORazepam  0-4 mg Oral Q12H  . metoprolol tartrate  12.5 mg Oral BID  .  multivitamin with minerals  1 tablet Oral Daily  . nicotine  21 mg Transdermal Daily  . pantoprazole  40 mg Oral Q0600  . sodium chloride flush  3 mL Intravenous Q12H  . thiamine  100 mg Oral Daily   Or  . thiamine  100 mg Intravenous Daily  . thiamine  100 mg Oral Daily   Continuous Infusions: . sodium chloride    . potassium chloride 10 mEq (05/05/20 1126)     LOS: 2 days    Time spent: 36 minutes spent on chart review, discussion with nursing staff, consultants, updating family and interview/physical exam; more than 50% of that time was spent in counseling and/or coordination of care.    Jaymian Bogart J [place] DO [facility] Available via Epic secure chat 7am-7pm After these hours, please refer to coverage provider listed on [email or web address] 05/05/2020, 12:49 PM

## 2020-05-05 NOTE — Anesthesia Preprocedure Evaluation (Addendum)
Anesthesia Evaluation  Patient identified by MRN, date of birth, ID band  Reviewed: Allergy & Precautions, NPO status , Patient's Chart, lab work & pertinent test results, reviewed documented beta blocker date and time   Airway Mallampati: II  TM Distance: >3 FB Neck ROM: Full    Dental  (+) Dental Advisory Given, Loose, Chipped, Poor Dentition   Pulmonary neg pulmonary ROS, Current Smoker and Patient abstained from smoking.,    Pulmonary exam normal breath sounds clear to auscultation       Cardiovascular hypertension, Pt. on medications and Pt. on home beta blockers Normal cardiovascular exam Rhythm:Regular Rate:Normal  Echo 1. Left ventricular ejection fraction, by estimation, is 60 to 65%. The left ventricle has normal function. The left ventricle has no regional wall motion abnormalities. Left ventricular diastolic parameters are consistent with Grade I diastolic dysfunction (impaired relaxation).  2. Right ventricular systolic function is normal. The right ventricular size is normal. Tricuspid regurgitation signal is inadequate for assessing PA pressure.  3. The mitral valve is normal in structure and function. No evidence of mitral valve regurgitation. No evidence of mitral stenosis.  4. The aortic valve is tricuspid. Aortic valve regurgitation is not visualized. No aortic stenosis is present.  5. The inferior vena cava is normal in size with greater than 50% respiratory variability, suggesting right atrial pressure of 3 mmHg.    Neuro/Psych Seizures -,  PSYCHIATRIC DISORDERS Anxiety Depression Bipolar Disorder Schizophrenia    GI/Hepatic GERD  ,(+)     substance abuse  alcohol use and marijuana use, Hepatitis -  Endo/Other  negative endocrine ROS  Renal/GU negative Renal ROS     Musculoskeletal negative musculoskeletal ROS (+)   Abdominal   Peds  Hematology negative hematology ROS (+)   Anesthesia Other  Findings   Reproductive/Obstetrics                            Anesthesia Physical Anesthesia Plan  ASA: III  Anesthesia Plan: MAC   Post-op Pain Management:    Induction: Intravenous  PONV Risk Score and Plan: Propofol infusion, Treatment may vary due to age or medical condition and TIVA  Airway Management Planned:   Additional Equipment:   Intra-op Plan:   Post-operative Plan:   Informed Consent: I have reviewed the patients History and Physical, chart, labs and discussed the procedure including the risks, benefits and alternatives for the proposed anesthesia with the patient or authorized representative who has indicated his/her understanding and acceptance.     Dental advisory given  Plan Discussed with: CRNA  Anesthesia Plan Comments:         Anesthesia Quick Evaluation

## 2020-05-05 NOTE — Transfer of Care (Signed)
Immediate Anesthesia Transfer of Care Note  Patient: [name]  Procedure(s) Performed: ESOPHAGOGASTRODUODENOSCOPY (EGD) WITH PROPOFOL (N/A )  Patient Location: PACU  Anesthesia Type:MAC  Level of Consciousness: awake, alert  and oriented  Airway & Oxygen Therapy: Patient Spontanous Breathing and Patient connected to nasal cannula oxygen  Post-op Assessment: Report given to RN and Post -op Vital signs reviewed and stable  Post vital signs: Reviewed and stable  Last Vitals:  Vitals Value Taken Time  BP 101/65 05/05/20 0935  Temp    Pulse 95 05/05/20 0935  Resp 28 05/05/20 0935  SpO2 89 % 05/05/20 0935    Last Pain:  Vitals:   05/05/20 0935  TempSrc:   PainSc: 7          Complications: No complications documented.

## 2020-05-05 NOTE — Op Note (Addendum)
[facility] Patient Name: [name] Procedure Date : 05/05/2020 MRN: [id number] Attending MD: [name] , MD Date of Birth: 1971-01-31 CSN: [id number] Age: 49 Admit Type: Inpatient Procedure:                Upper GI endoscopy Indications:              Suspected upper gastrointestinal bleeding in                            patient with chronic blood loss with intermittent                            melena, To evaluate esophageal varices in patient                            with suspected portal hypertension, Alcohol induced                            cirrhosis (MELD 17), acute alcoholic hepatitis Providers:                [name], MD, Glori Bickers, RN, Tyna Jaksch Technician, Clearnce Sorrel, CRNA Referring MD:              Medicines:                Monitored Anesthesia Care Complications:            No immediate complications. Estimated Blood Loss:     Estimated blood loss was minimal. Procedure:                Pre-Anesthesia Assessment:                           - Prior to the procedure, a History and Physical                            was performed, and patient medications and                            allergies were reviewed. The patient's tolerance of                            previous anesthesia was also reviewed. The risks                            and benefits of the procedure and the sedation                            options and risks were discussed with the patient.                            All questions were answered, and informed consent  was obtained. Prior Anticoagulants: The patient has                            taken no previous anticoagulant or antiplatelet                            agents. ASA Grade Assessment: III - A patient with                            severe systemic disease. After reviewing the risks                            and benefits, the patient was  deemed in                            satisfactory condition to undergo the procedure.                           After obtaining informed consent, the endoscope was                            passed under direct vision. Throughout the                            procedure, the patient's blood pressure, pulse, and                            oxygen saturations were monitored continuously. The                            [id number]-H190 ([id number]) Olympus gastroscope was                            introduced through the mouth, and advanced to the                            second part of duodenum. The upper GI endoscopy was                            accomplished without difficulty. The patient                            tolerated the procedure well. Scope In: Scope Out: Findings:      The Z-line was regular and was found 38 cm from the incisors.      Grade I varices were found in the lower third of the esophagus. They       were less than 5 mm in largest diameter.      Mild portal hypertensive gastropathy was found in the gastric fundus and       in the gastric body.      Patchy mildly erythematous mucosa without active bleeding was found in       the duodenal bulb.      The exam was otherwise without abnormality. Impression:               -  Z-line regular, 38 cm from the incisors.                           - Grade I esophageal varices.                           - Portal hypertensive gastropathy.                           - Erythematous duodenopathy.                           - The examination was otherwise normal.                           - No specimens collected. Recommendation:           - Patient has a contact number available for                            emergencies. The signs and symptoms of potential                            delayed complications were discussed with the                            patient. Return to normal activities tomorrow.                            Written  discharge instructions were provided to the                            patient.                           - Resume previous diet.                           - Continue present medications.                           - Use Protonix (pantoprazole) 40 mg PO daily.                           - Discussed alcohol cessation                           - Discriminant function score less than 32, no                            indication for steroids/prednisolone                           - Monitor CMP and CBC daily, transfuse as needed                           - Follow up with PMD on discharge and GI as  needed                           - If patient is agreeable refer patient to GI for                            colonoscopy for colorectal cancer screening as                            outpatient                           - Called and discussed results and management plan                            with his mother                           - GI will sign off, please call with any questions Procedure Code(s):        --- Professional ---                           [phone], Esophagogastroduodenoscopy, flexible,                            transoral; diagnostic, including collection of                            specimen(s) by brushing or washing, when performed                            (separate procedure) Diagnosis Code(s):        --- Professional ---                           I85.00, Esophageal varices without bleeding                           K76.6, Portal hypertension                           K31.89, Other diseases of stomach and duodenum                           R58, Hemorrhage, not elsewhere classified CPT copyright 2019 American Medical Association. All rights reserved. The codes documented in this report are preliminary and upon coder review may  be revised to meet current compliance requirements. [name], MD 05/05/2020 10:18:54 AM This report has been signed electronically. Number  of Addenda: 0

## 2020-05-05 NOTE — Anesthesia Postprocedure Evaluation (Signed)
Anesthesia Post Note  Patient: [name]  Procedure(s) Performed: ESOPHAGOGASTRODUODENOSCOPY (EGD) WITH PROPOFOL (N/A )     Patient location during evaluation: PACU Anesthesia Type: MAC Level of consciousness: awake and alert Pain management: pain level controlled Vital Signs Assessment: post-procedure vital signs reviewed and stable Respiratory status: spontaneous breathing Cardiovascular status: stable Anesthetic complications: no   No complications documented.  Last Vitals:  Vitals:   05/05/20 1247 05/05/20 1527  BP: 93/62 114/61  Pulse: 76 89  Resp: 17 18  Temp: 36.6 C 36.6 C  SpO2: 95% 93%    Last Pain:  Vitals:   05/05/20 1527  TempSrc: Oral  PainSc:                  Nolon Nations

## 2020-05-05 NOTE — Interval H&P Note (Signed)
History and Physical Interval Note:  05/05/2020 9:08 AM  [name]  has presented today for surgery, with the diagnosis of intermittent black stools.  non-bloody emesis.  anemia.  alcoholic hepatitis and fatty liver.  The various methods of treatment have been discussed with the patient and family. After consideration of risks, benefits and other options for treatment, the patient has consented to  Procedure(s): ESOPHAGOGASTRODUODENOSCOPY (EGD) WITH PROPOFOL (N/A) as a surgical intervention.  The patient's history has been reviewed, patient examined, no change in status, stable for surgery.  I have reviewed the patient's chart and labs.  Questions were answered to the patient's satisfaction.     Telia Amundson

## 2020-05-06 LAB — TYPE AND SCREEN
ABO/RH(D): O POS
Antibody Screen: NEGATIVE
Unit division: 0
Unit division: 0

## 2020-05-06 LAB — BPAM RBC
Blood Product Expiration Date: 202110152359
Blood Product Expiration Date: 202110212359
ISSUE DATE / TIME: 202109171119
ISSUE DATE / TIME: 202109181221
Unit Type and Rh: 5100
Unit Type and Rh: 5100

## 2020-05-06 LAB — COMPREHENSIVE METABOLIC PANEL
ALT: 76 U/L — ABNORMAL HIGH (ref 0–44)
AST: 205 U/L — ABNORMAL HIGH (ref 15–41)
Albumin: 1.8 g/dL — ABNORMAL LOW (ref 3.5–5.0)
Alkaline Phosphatase: 145 U/L — ABNORMAL HIGH (ref 38–126)
Anion gap: 11 (ref 5–15)
BUN: <5 mg/dL — ABNORMAL LOW (ref 6–20)
CO2: 33 mmol/L — ABNORMAL HIGH (ref 22–32)
Calcium: 8.3 mg/dL — ABNORMAL LOW (ref 8.9–10.3)
Chloride: 89 mmol/L — ABNORMAL LOW (ref 98–111)
Creatinine, Ser: 0.75 mg/dL (ref 0.61–1.24)
GFR calc Af Amer: >60 mL/min (ref >60)
GFR calc non Af Amer: >60 mL/min (ref >60)
Glucose, Bld: 130 mg/dL — ABNORMAL HIGH (ref 70–99)
Potassium: 3 mmol/L — ABNORMAL LOW (ref 3.5–5.1)
Sodium: 133 mmol/L — ABNORMAL LOW (ref 135–145)
Total Bilirubin: 8.6 mg/dL — ABNORMAL HIGH (ref 0.3–1.2)
Total Protein: 5.1 g/dL — ABNORMAL LOW (ref 6.5–8.1)

## 2020-05-06 LAB — CBC
HCT: 23.6 % — ABNORMAL LOW (ref 39.0–52.0)
Hemoglobin: 8.1 g/dL — ABNORMAL LOW (ref 13.0–17.0)
MCH: 32.7 pg (ref 26.0–34.0)
MCHC: 34.3 g/dL (ref 30.0–36.0)
MCV: 95.2 fL (ref 80.0–100.0)
Platelets: 225 10*3/uL (ref 150–400)
RBC: 2.48 MIL/uL — ABNORMAL LOW (ref 4.22–5.81)
RDW: 22.8 % — ABNORMAL HIGH (ref 11.5–15.5)
WBC: 19.2 10*3/uL — ABNORMAL HIGH (ref 4.0–10.5)
nRBC: 0.7 % — ABNORMAL HIGH (ref 0.0–0.2)

## 2020-05-06 LAB — MAGNESIUM: Magnesium: 1.7 mg/dL (ref 1.7–2.4)

## 2020-05-06 MED ORDER — MAGNESIUM SULFATE 2 GM/50ML IV SOLN
2.0000 g | Freq: Once | INTRAVENOUS | Status: AC
  Administered 2020-05-06: 2 g via INTRAVENOUS
  Filled 2020-05-06: qty 50

## 2020-05-06 MED ORDER — POTASSIUM CHLORIDE CRYS ER 20 MEQ PO TBCR
30.0000 meq | EXTENDED_RELEASE_TABLET | ORAL | Status: AC
  Administered 2020-05-06 (×3): 30 meq via ORAL
  Filled 2020-05-06 (×2): qty 1

## 2020-05-06 NOTE — Progress Notes (Signed)
PROGRESS NOTE    [name]  CVE:[id number] DOB: [birth year] DOA: 05/02/2020 PCP: Nicolette Bang, DO    Brief Narrative:  Carnel Stegman is a 49 year old male with past medical history notable for EtOH use disorder, essential hypertension, GERD, schizoaffective disorder who presented to the emergency department via EMS following syncopal episode at home.  Patient with history of seizures in the past related to alcohol withdrawal.  Patient reportedly has been drinking heavily with 524 ounce cans of loco the day before admission.  On arrival, patient complaining of headache, neck pain, and also episode of vomiting without hematemesis.  No abdominal pain or hematochezia.  Also endorses smokes 1/2 pack of cigarettes daily for the past 30 years with occasional marijuana use.  In the ED, patient was noted to have alcohol level elevated at 144, sodium 129, potassium level less than 2.  Given syncopal episode, EtOH abuse and significant electrolyte abnormalities, TR[facility] was consulted for admission.   Assessment & Plan:   Principal Problem:   Syncope Active Problems:   Hypokalemia   Hyponatremia   Tobacco dependence due to cigarettes   Alcoholic hepatitis without ascites   Hypomagnesemia   Hyperbilirubinemia   D-dimer, elevated   Alcoholism (HCC)   Prolonged QT interval   Melena   Symptomatic anemia   Alcoholic cirrhosis of liver with ascites ([place])   Syncopal episode Patient presented to the ED after having syncopal episode at home.  History of seizures from alcohol withdrawal.  Patient was found to be intoxicated following heavy drinking over the past several days from underlying alcohol dependence.  Also notable for severe electrolyte derangements with hyponatremia and potassium less than 2.  UDS positive for THC.  EtOH level 144.  Elevated D-dimer on admission with CT angiogram chest negative for PE.  CT head/C-spine negative for acute intracranial abnormality or  fracture/subluxation.  EEG without seizure or epileptiform discharges.  Etiology likely secondary to severe electrolyte disturbance as well as intoxication. --Continue to monitor on telemetry --Continue electrolyte replacement --Discussed alcohol cessation --Supportive care  Alcoholic hepatitis with esophageal varices EtOH intoxication/dependence Patient with longstanding history of alcohol dependence.  Binge drinking prior to admission.  Was found to have alcohol level elevated to 144.  CT angiogram chest with findings of severe steatosis liver.  LFTs on admission with AST/ALT ratio consistent with alcohol abuse.  Ultrasound abdomen with sludge in gallbladder, no gallstones or gallbladder wall thickening, increased echogenicity indicative of hepatic steatosis but no focal liver lesions evident. EGD 05/05/2020 with grade 1 varices lower third of esophagus, less than 5 mm in largest diameter, mild portal hypertensive gastropathy and mildly erythematous mucosa without active bleeding duodenal bulb. --Continue Protonix 40 mg p.o. daily --Outpatient follow-up with gastroenterology --EtOH cessation --CIWAA protocol with symptom triggered Ativan --Thiamine, folic acid, multivitamin  Hypokalemia, severe Hypomagnesemia Potassium less than 2 on admission, likely secondary to severe alcohol abuse.  Repleted K and Mg aggressively during hospitalization. --K <2, 2.3>2.6>3.2>3.1>3.0 --Replete potassium and magnesium today --Repeat electrolytes include magnesium in the a.m.  Hyponatremia Sodium 129 admission, likely secondary to beer potomania --Na 129>133 --Encourage increased oral intake --Repeat BMP in the a.m.  Anemia Folic acid deficiency Iron 122, TIBC 129, ferritin 2307, folate low at 2.9. --Transfused 1 unit PRBC on 9/17, will transfuse an additional unit today --Continue folic acid 1 mg p.o. daily --Follow CBC daily  Essential hypertension --Continue metoprolol tartrate 12.5 mg p.o.  twice daily  GERD: Continue PPI  Schizoaffective disorder Psychiatry consulted on 05/03/2020. --Librium  25 mg p.o. 3 times daily --Seroquel 50 mg p.o. nightly and Zoloft 25 mg p.o. daily  Tobacco use disorder: Discussed with patient need for cessation --Nicotine patch   DVT prophylaxis: SCDs Code Status: Full code Family Communication: Updated patient extensively at bedside  Disposition Plan:  Status is: Inpatient  Remains inpatient appropriate because:Persistent severe electrolyte disturbances, Unsafe d/c plan, IV treatments appropriate due to intensity of illness or inability to take PO and Inpatient level of care appropriate due to severity of illness   Dispo: The patient is from: Home              Anticipated d/c is to: Home              Anticipated d/c date is: 1 day              Patient currently is not medically stable to d/c.  Consultants:   [facility]  Psychiatry  Procedures:   EGD 9/18: Grade 1 esophageal varices, portal hypertensive gastropathy, gastritis  Antimicrobials:   None   Subjective: Patient seen and examined bedside, resting comfortably.  Continues with fatigue.  On ambulation with nursing staff this afternoon, patient with unsteady gait with difficulty on balance.  Awaiting PT evaluation.  Hemoglobin stable, 8.2 this morning. No other complaints or concerns at this time.  Denies headache, no visual changes, no chest pain, no palpitations, no fever/chills/night sweats, no nausea/vomiting/diarrhea, no abdominal pain, no paresthesias.  No acute events overnight per nursing staff.  Objective: Vitals:   05/05/20 2340 05/06/20 0459 05/06/20 0914 05/06/20 0915  BP: 121/80 125/79 133/85 133/85  Pulse: 89 (!) 104 (!) 109 (!) 105  Resp: 16 17    Temp: 98.2 F (36.8 C) 98.4 F (36.9 C)    TempSrc: Oral Oral    SpO2: 93% 92% 98%   Weight:      Height:        Intake/Output Summary (Last 24 hours) at 05/06/2020 1506 Last data filed at 05/06/2020  9518 Gross per 24 hour  Intake 1156 ml  Output 1150 ml  Net 6 ml   Filed Weights   05/03/20 2025  Weight: 105.4 kg    Examination:  General exam: Appears calm and comfortable, appears older than stated age Respiratory system: Clear to auscultation. Respiratory effort normal. Cardiovascular system: S1 & S2 heard, RRR. No JVD, murmurs, rubs, gallops or clicks. No pedal edema. Gastrointestinal system: Abdomen is nondistended, soft and nontender. No organomegaly or masses felt. Normal bowel sounds heard. Central nervous system: Alert. No focal neurological deficits. Extremities: Symmetric 5 x 5 power. Skin: Jaundice Psychiatry: Judgement and insight appear normal. Mood & affect appropriate.     Data Reviewed: I have personally reviewed following labs and imaging studies  CBC: Recent Labs  Lab 05/03/20 0005 05/03/20 0005 05/03/20 0420 05/03/20 0420 05/04/20 0553 05/04/20 1515 05/05/20 0313 05/05/20 1845 05/06/20 0056  WBC 11.3*  --  10.2  --  13.2*  --  13.6*  --  19.2*  NEUTROABS 8.1*  --   --   --   --   --   --   --   --   HGB 8.2*   < > 7.4*   < > 6.5* 6.8* 7.0* 8.2* 8.1*  HCT 23.5*   < > 20.9*   < > 18.2* 19.7* 20.3* 24.1* 23.6*  MCV 93.3  --  93.3  --  95.3  --  94.0  --  95.2  PLT 384  --  341  --  281  --  215  --  225   < > = values in this interval not displayed.   Basic Metabolic Panel: Recent Labs  Lab 05/03/20 0005 05/03/20 0005 05/03/20 0420 05/03/20 0420 05/04/20 0553 05/04/20 1515 05/04/20 1742 05/04/20 2034 05/05/20 0313 05/05/20 1845 05/06/20 0056  NA 129*   < > 131*  --  130*  --   --  133* 134*  --  133*  K <2.0*   < > 4.1   < > 2.3*   < > 2.6* 3.2* 3.1* 3.2* 3.0*  CL 73*   < > 77*  --  82*  --   --  86* 87*  --  89*  CO2 35*   < > 35*  --  43*  --   --  36* 38*  --  33*  GLUCOSE 127*   < > 121*  --  117*  --   --  110* 100*  --  130*  BUN 6   < > 6  --  11  --   --  7 5*  --  <5*  CREATININE 0.72   < > 0.62  --  0.75  --   --  0.69  0.64  --  0.75  CALCIUM 7.6*   < > 7.4*  --  7.8*  --   --  7.8* 7.9*  --  8.3*  MG 1.4*  --   --   --  1.6*  --   --   --  2.0  --  1.7   < > = values in this interval not displayed.   GFR: Estimated Creatinine Clearance: 143.2 mL/min (by C-G formula based on SCr of 0.75 mg/dL). Liver Function Tests: Recent Labs  Lab 05/03/20 0420 05/04/20 0553 05/04/20 2034 05/05/20 0313 05/06/20 0056  AST 192* 241* 248* 236* 205*  ALT 80* 84* 82* 83* 76*  ALKPHOS 144* 136* 125 133* 145*  BILITOT 6.7* 9.5* 9.5* 8.7* 8.6*  PROT 5.0* 5.0* 5.0* 4.8* 5.1*  ALBUMIN 1.8* 1.7* 1.7* 1.7* 1.8*   No results for input(s): LIPASE, AMYLASE in the last 168 hours. Recent Labs  Lab 05/03/20 0005  AMMONIA 46*   Coagulation Profile: Recent Labs  Lab 05/04/20 1515  INR 1.2   Cardiac Enzymes: No results for input(s): CKTOTAL, CKMB, CKMBINDEX, TROPONINI in the last 168 hours. BNP (last 3 results) No results for input(s): PROBNP in the last 8760 hours. HbA1C: No results for input(s): HGBA1C in the last 72 hours. CBG: No results for input(s): GLUCAP in the last 168 hours. Lipid Profile: No results for input(s): CHOL, HDL, LDLCALC, TRIG, CHOLHDL, LDLDIRECT in the last 72 hours. Thyroid Function Tests: No results for input(s): TSH, T4TOTAL, FREET4, T3FREE, THYROIDAB in the last 72 hours. Anemia Panel: No results for input(s): VITAMINB12, FOLATE, FERRITIN, TIBC, IRON, RETICCTPCT in the last 72 hours. Sepsis Labs: No results for input(s): PROCALCITON, LATICACIDVEN in the last 168 hours.  Recent Results (from the past 240 hour(s))  SARS Coronavirus 2 by RT PCR (hospital order, performed in [facility] hospital lab) Nasopharyngeal Nasopharyngeal Swab     Status: None   Collection Time: 05/03/20 12:23 AM   Specimen: Nasopharyngeal Swab  Result Value Ref Range Status   SARS Coronavirus 2 NEGATIVE NEGATIVE Final    Comment: (NOTE) SARS-CoV-2 target nucleic acids are NOT DETECTED.  The SARS-CoV-2 RNA is  generally detectable in upper and lower respiratory specimens during the acute phase  of infection. The lowest concentration of SARS-CoV-2 viral copies this assay can detect is 250 copies / mL. A negative result does not preclude SARS-CoV-2 infection and should not be used as the sole basis for treatment or other patient management decisions.  A negative result may occur with improper specimen collection / handling, submission of specimen other than nasopharyngeal swab, presence of viral mutation(s) within the areas targeted by this assay, and inadequate number of viral copies (<250 copies / mL). A negative result must be combined with clinical observations, patient history, and epidemiological information.  Fact Sheet for Patients:   StrictlyIdeas.no  Fact Sheet for Healthcare Providers: BankingDealers.co.za  This test is not yet approved or  cleared by the [place] FDA and has been authorized for detection and/or diagnosis of SARS-CoV-2 by FDA under an Emergency Use Authorization (EUA).  This EUA will remain in effect (meaning this test can be used) for the duration of the COVID-19 declaration under Section 564(b)(1) of the Act, 21 U.S.C. section 360bbb-3(b)(1), unless the authorization is terminated or revoked sooner.  Performed at [facility], [place] [address] [place], [place] [address]          Radiology Studies: No results found.      Scheduled Meds: . sodium chloride   Intravenous Once  . chlordiazePOXIDE  25 mg Oral TID  . folic acid  1 mg Oral Daily  . LORazepam  0-4 mg Intravenous Q12H   Or  . LORazepam  0-4 mg Oral Q12H  . metoprolol tartrate  12.5 mg Oral BID  . multivitamin with minerals  1 tablet Oral Daily  . nicotine  21 mg Transdermal Daily  . pantoprazole  40 mg Oral Q0600  . QUEtiapine  50 mg Oral QHS  . sertraline  25 mg Oral Daily  . sodium chloride flush  3 mL Intravenous Q12H  .  thiamine  100 mg Oral Daily   Or  . thiamine  100 mg Intravenous Daily  . thiamine  100 mg Oral Daily   Continuous Infusions: . sodium chloride       LOS: 3 days    Time spent: 36 minutes spent on chart review, discussion with nursing staff, consultants, updating family and interview/physical exam; more than 50% of that time was spent in counseling and/or coordination of care.    Lucetta Baehr J [place] DO [facility] Available via Epic secure chat 7am-7pm After these hours, please refer to coverage provider listed on [email or web address] 05/06/2020, 3:06 PM

## 2020-05-06 NOTE — Evaluation (Signed)
Physical Therapy Evaluation and Discharge Patient Details Name: [name] MRN: [id number] DOB: 1970/11/29 Today's Date: 05/06/2020   History of Present Illness  49 y.o. male with medical history significant for alcoholism, HTN, GERD, schizoaffective disorder who presents to emergency room by EMS after having a syncopal event at home. +elevated ETOH on arrival; reports drinks heavily daily; hypokalemia; hyponatremia; elevated d-Dimer with CT angio chest negative for PE; EEG negativve for seizure; Hgb 6.5 with EGD showing esophageal varices  Clinical Impression   Patient evaluated by Physical Therapy with no further acute PT needs identified. All education has been completed and the patient has no further questions. He agrees he is more steady with use of RW (he owns RW at home). He is jittery and with incr RR, however sats >94% on room air x 150 ft with max HR 109.  PT is signing off. Thank you for this referral.     Follow Up Recommendations No PT follow up    Equipment Recommendations  None recommended by PT    Recommendations for Other Services       Precautions / Restrictions Precautions Precautions: Fall      Mobility  Bed Mobility Overal bed mobility: Modified Independent                Transfers Overall transfer level: Modified independent Equipment used: Rolling walker (2 wheeled)             General transfer comment: no cues needed;   Ambulation/Gait Ambulation/Gait assistance: Supervision Gait Distance (Feet): 150 Feet Assistive device: Rolling walker (2 wheeled) Gait Pattern/deviations: Step-through pattern;Decreased stride length (decreased hip/knee  flexion) Gait velocity: decr   General Gait Details: pt required cues for safe use of Rw when turning  Stairs            Wheelchair Mobility    Modified Rankin (Stroke Patients Only)       Balance Overall balance assessment: Mild deficits observed, not formally tested (jittery,  anxious, withdrawal)                                           Pertinent Vitals/Pain Pain Assessment: No/denies pain    Home Living Family/patient expects to be discharged to:: Private residence Living Arrangements: Alone   Type of Home: Apartment Home Access: Stairs to enter Entrance Stairs-Rails: Left Entrance Stairs-Number of Steps: flight Home Layout: One level Home Equipment: Environmental consultant - 2 wheels      Prior Function Level of Independence: Independent         Comments: reports 2 falls in past month (passed out; seizure)     Hand Dominance   Dominant Hand: Right    Extremity/Trunk Assessment   Upper Extremity Assessment Upper Extremity Assessment: Overall WFL for tasks assessed    Lower Extremity Assessment Lower Extremity Assessment: Overall WFL for tasks assessed    Cervical / Trunk Assessment Cervical / Trunk Assessment: Other exceptions Cervical / Trunk Exceptions: overweight  Communication   Communication: No difficulties  Cognition Arousal/Alertness: Awake/alert Behavior During Therapy: Anxious Overall Cognitive Status: Within Functional Limits for tasks assessed                                 General Comments: on arrival, jittery, rapid breathing (RN reports withdrawing from ETOH)      General Comments  Exercises     Assessment/Plan    PT Assessment Patent does not need any further PT services  PT Problem List         PT Treatment Interventions      PT Goals (Current goals can be found in the Care Plan section)  Acute Rehab PT Goals Patient Stated Goal: none PT Goal Formulation: All assessment and education complete, DC therapy    Frequency     Barriers to discharge        Co-evaluation               AM-PAC PT "6 Clicks" Mobility  Outcome Measure Help needed turning from your back to your side while in a flat bed without using bedrails?: None Help needed moving from lying on your  back to sitting on the side of a flat bed without using bedrails?: None Help needed moving to and from a bed to a chair (including a wheelchair)?: None Help needed standing up from a chair using your arms (e.g., wheelchair or bedside chair)?: None Help needed to walk in hospital room?: A Little Help needed climbing 3-5 steps with a railing? : A Little 6 Click Score: 22    End of Session Equipment Utilized During Treatment: Gait belt Activity Tolerance: Patient tolerated treatment well Patient left: in bed;with call bell/phone within reach;with bed alarm set Nurse Communication: Mobility status PT Visit Diagnosis: Other abnormalities of gait and mobility (R26.89)    Time: 0156-1537 PT Time Calculation (min) (ACUTE ONLY): 22 min   Charges:   PT Evaluation $PT Eval Low Complexity: 1 Low           Arby Barrette, PT Pager [phone]   Rexanne Mano 05/06/2020, 3:18 PM

## 2020-05-06 NOTE — Plan of Care (Signed)
  Problem: Education: Goal: Knowledge of General Education information will improve Description: Including pain rating scale, medication(s)/side effects and non-pharmacologic comfort measures Outcome: Completed/Met

## 2020-05-06 NOTE — Progress Notes (Signed)
SATURATION QUALIFICATIONS: (This note is used to comply with regulatory documentation for home oxygen)  Patient Saturations on Room Air at Rest = 91%  Patient Saturations on Room Air while Ambulating = 92%  Patient Saturations on 2 Liters of oxygen while Ambulating = 100%  Pt ambulated with RN to nurses station and back. Pt denies chest pain and shortness of breath. Pt HR 110-120s when ambulating. Pt gait is unsteady. Pt resting in bed at this time on room air. Informed MD, MD consulted PT

## 2020-05-07 LAB — CBC
HCT: 24.3 % — ABNORMAL LOW (ref 39.0–52.0)
Hemoglobin: 8 g/dL — ABNORMAL LOW (ref 13.0–17.0)
MCH: 32.1 pg (ref 26.0–34.0)
MCHC: 32.9 g/dL (ref 30.0–36.0)
MCV: 97.6 fL (ref 80.0–100.0)
Platelets: 196 10*3/uL (ref 150–400)
RBC: 2.49 MIL/uL — ABNORMAL LOW (ref 4.22–5.81)
RDW: 23.5 % — ABNORMAL HIGH (ref 11.5–15.5)
WBC: 15.6 10*3/uL — ABNORMAL HIGH (ref 4.0–10.5)
nRBC: 0.6 % — ABNORMAL HIGH (ref 0.0–0.2)

## 2020-05-07 LAB — COMPREHENSIVE METABOLIC PANEL
ALT: 66 U/L — ABNORMAL HIGH (ref 0–44)
AST: 165 U/L — ABNORMAL HIGH (ref 15–41)
Albumin: 1.7 g/dL — ABNORMAL LOW (ref 3.5–5.0)
Alkaline Phosphatase: 156 U/L — ABNORMAL HIGH (ref 38–126)
Anion gap: 9 (ref 5–15)
BUN: 5 mg/dL — ABNORMAL LOW (ref 6–20)
CO2: 32 mmol/L (ref 22–32)
Calcium: 8.5 mg/dL — ABNORMAL LOW (ref 8.9–10.3)
Chloride: 95 mmol/L — ABNORMAL LOW (ref 98–111)
Creatinine, Ser: 0.76 mg/dL (ref 0.61–1.24)
GFR calc Af Amer: >60 mL/min (ref >60)
GFR calc non Af Amer: >60 mL/min (ref >60)
Glucose, Bld: 105 mg/dL — ABNORMAL HIGH (ref 70–99)
Potassium: 3.2 mmol/L — ABNORMAL LOW (ref 3.5–5.1)
Sodium: 136 mmol/L (ref 135–145)
Total Bilirubin: 9.5 mg/dL — ABNORMAL HIGH (ref 0.3–1.2)
Total Protein: 5.1 g/dL — ABNORMAL LOW (ref 6.5–8.1)

## 2020-05-07 LAB — MAGNESIUM: Magnesium: 1.7 mg/dL (ref 1.7–2.4)

## 2020-05-07 MED ORDER — POTASSIUM CHLORIDE CRYS ER 20 MEQ PO TBCR
40.0000 meq | EXTENDED_RELEASE_TABLET | ORAL | Status: AC
  Administered 2020-05-07 (×2): 40 meq via ORAL
  Filled 2020-05-07 (×2): qty 2

## 2020-05-07 MED ORDER — CHLORDIAZEPOXIDE HCL 25 MG PO CAPS: ORAL_CAPSULE | ORAL | 0 refills | Status: AC

## 2020-05-07 MED ORDER — MAGNESIUM SULFATE 2 GM/50ML IV SOLN
2.0000 g | Freq: Once | INTRAVENOUS | Status: AC
  Administered 2020-05-07: 2 g via INTRAVENOUS
  Filled 2020-05-07: qty 50

## 2020-05-07 MED ORDER — SERTRALINE HCL 25 MG PO TABS: 25.0000 mg | ORAL_TABLET | Freq: Every day | ORAL | 0 refills | Status: DC

## 2020-05-07 MED ORDER — FOLIC ACID 1 MG PO TABS: 1.0000 mg | ORAL_TABLET | Freq: Every day | ORAL | 0 refills | Status: DC

## 2020-05-07 MED ORDER — ADULT MULTIVITAMIN W/MINERALS CH: 1.0000 | ORAL_TABLET | Freq: Every day | ORAL | Status: DC

## 2020-05-07 MED ORDER — METOPROLOL TARTRATE 25 MG PO TABS: 12.5000 mg | ORAL_TABLET | Freq: Two times a day (BID) | ORAL | 0 refills | Status: DC

## 2020-05-07 MED ORDER — PANTOPRAZOLE SODIUM 40 MG PO TBEC: 40.0000 mg | DELAYED_RELEASE_TABLET | Freq: Every day | ORAL | 0 refills | Status: DC

## 2020-05-07 MED ORDER — POTASSIUM CHLORIDE CRYS ER 20 MEQ PO TBCR: 40.0000 meq | EXTENDED_RELEASE_TABLET | Freq: Every day | ORAL | 0 refills | Status: DC

## 2020-05-07 MED ORDER — THIAMINE HCL 100 MG PO TABS: 100.0000 mg | ORAL_TABLET | Freq: Every day | ORAL | 0 refills | Status: AC

## 2020-05-07 MED ORDER — QUETIAPINE FUMARATE 50 MG PO TABS: 50.0000 mg | ORAL_TABLET | Freq: Every day | ORAL | 0 refills | Status: DC

## 2020-05-07 NOTE — Care Management Important Message (Signed)
Important Message  Patient Details  Name: [name] MRN: [id number] Date of Birth: [birth year]   Medicare Important Message Given:  Yes - Important Message mailed due to current National Emergency  Verbal consent obtained due to current National Emergency  Relationship to patient: Self Contact Name: Delvis Kau Call Date: 05/07/20  Time: 1051 Phone: [phone] Outcome: No Answer/Busy Important Message mailed to: Patient address on file    Delorse Lek 05/07/2020, 10:51 AM

## 2020-05-07 NOTE — Discharge Summary (Signed)
Physician Discharge Summary  [name]:[id number] DOB: [birth year] DOA: 05/02/2020  PCP: Nicolette Bang, DO  Admit date: 05/02/2020 Discharge date: 05/07/2020  Admitted From: Home Disposition: Home  Recommendations for Outpatient Follow-up:  1. Follow up with PCP in 1-2 weeks 2. Continue Librium taper on discharge 3. Started on sertraline 25 mg p.o. daily and Seroquel 25 mg p.o. nightly 4. Please obtain BMP in one week to evaluate potassium level 5. Continue to encourage EtOH cessation 6. Outpatient follow-up with gastroenterology for colorectal cancer screening with colonoscopy  Home Health: No Equipment/Devices: None, has walker at home  Discharge Condition: Stable CODE STATUS: Full code Diet recommendation: Heart healthy diet  History of present illness:  [name] is a 49 year old male with past medical history notable for EtOH use disorder, essential hypertension, GERD, schizoaffective disorder who presented to the emergency department via EMS following syncopal episode at home.  Patient with history of seizures in the past related to alcohol withdrawal.  Patient reportedly has been drinking heavily with 524 ounce cans of loco the day before admission.  On arrival, patient complaining of headache, neck pain, and also episode of vomiting without hematemesis.  No abdominal pain or hematochezia.  Also endorses smokes 1/2 pack of cigarettes daily for the past 30 years with occasional marijuana use.  In the ED, patient was noted to have alcohol level elevated at 144, sodium 129, potassium level less than 2.  Given syncopal episode, EtOH abuse and significant electrolyte abnormalities, [facility] was consulted for admission.  Hospital course:  Syncopal episode Patient presented to the ED after having syncopal episode at home.  History of seizures from alcohol withdrawal.  Patient was found to be intoxicated following heavy drinking over the past several days from  underlying alcohol dependence.  Also notable for severe electrolyte derangements with hyponatremia and potassium less than 2.  UDS positive for THC.  EtOH level 144.  Elevated D-dimer on admission with CT angiogram chest negative for PE.  CT head/C-spine negative for acute intracranial abnormality or fracture/subluxation.  EEG without seizure or epileptiform discharges.  Etiology likely secondary to severe electrolyte disturbance as well as intoxication.  Patient was monitored on telemetry during hospitalization without any concerning arrhythmias noted.  Discussed alcohol cessation.  Alcoholic hepatitis with esophageal varices EtOH intoxication/dependence Patient with longstanding history of alcohol dependence.  Binge drinking prior to admission.  Was found to have alcohol level elevated to 144.  CT angiogram chest with findings of severe steatosis liver.  LFTs on admission with AST/ALT ratio consistent with alcohol abuse.  Ultrasound abdomen with sludge in gallbladder, no gallstones or gallbladder wall thickening, increased echogenicity indicative of hepatic steatosis but no focal liver lesions evident. EGD 05/05/2020 with grade 1 varices lower third of esophagus, less than 5 mm in largest diameter, mild portal hypertensive gastropathy and mildly erythematous mucosa without active bleeding duodenal bulb. Continue Protonix 40 mg p.o. daily. Outpatient follow-up with gastroenterology for consideration of colorectal screening with colonoscopy.  Continue encourage EtOH cessation.  Continue thiamine, folic acid, multivitamin.  Hypokalemia, severe Hypomagnesemia Potassium less than 2 on admission, likely secondary to severe alcohol abuse.  Repleted K and Mg aggressively during hospitalization.  Recommend repeat BMP at next visit.  Hyponatremia Sodium 129 admission, likely secondary to beer potomania.  Sodium 136 at time of discharge.  Repeat BMP at next visit.  EtOH cessation.  Anemia Folic acid  deficiency Iron 122, TIBC 129, ferritin 2307, folate low at 2.9.  Patient was transfused 1 unit PRBC  on 9/17 and 9/19.  Continue folic acid on discharge.  Repeat CBC next visit  Essential hypertension Continue metoprolol tartrate 12.5 mg p.o. twice daily  GERD: Continue Protonix 40 mg p.o. daily  Schizoaffective disorder Psychiatry consulted on 05/03/2020. Seroquel 50 mg p.o. nightly and Zoloft 25 mg p.o. daily  Tobacco use disorder: Discussed with patient need for cessation  Discharge Diagnoses:  Active Problems:   Hypokalemia   Hyponatremia   Tobacco dependence due to cigarettes   Alcoholic hepatitis without ascites   Hypomagnesemia   Hyperbilirubinemia   D-dimer, elevated   Alcoholism (HCC)   Alcoholic cirrhosis of liver with ascites [facility])    Discharge Instructions  Discharge Instructions    Call MD for:  difficulty breathing, headache or visual disturbances   Complete by: As directed    Call MD for:  extreme fatigue   Complete by: As directed    Call MD for:  persistant dizziness or light-headedness   Complete by: As directed    Call MD for:  persistant nausea and vomiting   Complete by: As directed    Call MD for:  severe uncontrolled pain   Complete by: As directed    Call MD for:  temperature >100.4   Complete by: As directed    Diet - low sodium heart healthy   Complete by: As directed    Increase activity slowly   Complete by: As directed    Other Restrictions   Complete by: As directed    Avoid all alcohol     Allergies as of 05/07/2020      Reactions   Wellbutrin [bupropion] Other (See Comments)   Caused seizures      Medication List    STOP taking these medications   amLODipine 10 MG tablet Commonly known as: NORVASC   feeding supplement (ENSURE ENLIVE) Liqd   ondansetron 8 MG disintegrating tablet Commonly known as: Zofran ODT     TAKE these medications   allopurinol 100 MG tablet Commonly known as: ZYLOPRIM Take 2 tablets (200  mg total) by mouth daily.   chlordiazePOXIDE 25 MG capsule Commonly known as: LIBRIUM Take 1 capsule (25 mg total) by mouth 3 (three) times daily for 2 days, THEN 1 capsule (25 mg total) 2 (two) times daily for 2 days, THEN 1 capsule (25 mg total) daily in the afternoon for 2 days. Start taking on: May 07, 2020 What changed: See the new instructions.   folic acid 1 MG tablet Commonly known as: FOLVITE Take 1 tablet (1 mg total) by mouth daily.   loperamide 2 MG capsule Commonly known as: IMODIUM Take 4 mg by mouth as needed for diarrhea or loose stools.   metoprolol tartrate 25 MG tablet Commonly known as: LOPRESSOR Take 0.5 tablets (12.5 mg total) by mouth 2 (two) times daily.   MILK THISTLE PO Take 1 capsule by mouth daily.   Misc. Devices Misc Blood Pressure monitor. Dx :Hypertension   multivitamin with minerals Tabs tablet Take 1 tablet by mouth daily. Start taking on: May 08, 2020   pantoprazole 40 MG tablet Commonly known as: PROTONIX Take 1 tablet (40 mg total) by mouth daily.   potassium chloride SA 20 MEQ tablet Commonly known as: KLOR-CON Take 2 tablets (40 mEq total) by mouth daily for 3 days.   QUEtiapine 50 MG tablet Commonly known as: SEROQUEL Take 1 tablet (50 mg total) by mouth at bedtime.   sertraline 25 MG tablet Commonly known as: ZOLOFT Take 1 tablet (  25 mg total) by mouth daily. Start taking on: May 08, 2020   thiamine 100 MG tablet Take 1 tablet (100 mg total) by mouth daily. Start taking on: May 08, 2020       Follow-up Information    Nicolette Bang, DO. Schedule an appointment as soon as possible for a visit in 1 week(s).   Specialty: Family Medicine Contact information: [address] [place] [address] [phone]              Allergies  Allergen Reactions  . Wellbutrin [Bupropion] Other (See Comments)    Caused seizures     Consultations:  Psychiatry  Gastroenterology   Procedures/Studies: CT HEAD WO CONTRAST  Result Date: 05/03/2020 CLINICAL DATA:  Alcohol withdrawal seizure. EXAM: CT HEAD WITHOUT CONTRAST CT CERVICAL SPINE WITHOUT CONTRAST TECHNIQUE: Multidetector CT imaging of the head and cervical spine was performed following the standard protocol without intravenous contrast. Multiplanar CT image reconstructions of the cervical spine were also generated. COMPARISON:  None. FINDINGS: CT HEAD FINDINGS Brain: There is no mass, hemorrhage or extra-axial collection. The size and configuration of the ventricles and extra-axial CSF spaces are normal. The brain parenchyma is normal, without evidence of acute or chronic infarction. Vascular: No abnormal hyperdensity of the major intracranial arteries or dural venous sinuses. No intracranial atherosclerosis. Skull: The visualized skull base, calvarium and extracranial soft tissues are normal. Sinuses/Orbits: Intermediate density secretions within the right maxillary sinus. The orbits are normal. CT CERVICAL SPINE FINDINGS Alignment: No static subluxation. Facets are aligned. Occipital condyles are normally positioned. Skull base and vertebrae: No acute fracture. Soft tissues and spinal canal: No prevertebral fluid or swelling. No visible canal hematoma. Disc levels: There is left C6 foraminal narrowing. Upper chest: No pneumothorax, pulmonary nodule or pleural effusion. Other: Normal visualized paraspinal cervical soft tissues. IMPRESSION: 1. No acute intracranial abnormality. 2. No acute fracture or static subluxation of the cervical spine. Electronically Signed   By: Ulyses Jarred M.D.   On: 05/03/2020 01:37   CT ANGIO CHEST PE W OR WO CONTRAST  Result Date: 05/03/2020 CLINICAL DATA:  Intermediate to low probability for pulmonary embolism. Positive D-dimer and syncope. EXAM: CT ANGIOGRAPHY CHEST WITH CONTRAST TECHNIQUE: Multidetector CT imaging of the chest was  performed using the standard protocol during bolus administration of intravenous contrast. Multiplanar CT image reconstructions and MIPs were obtained to evaluate the vascular anatomy. CONTRAST:  150mL OMNIPAQUE IOHEXOL 350 MG/ML SOLN COMPARISON:  06/01/2018 FINDINGS: Cardiovascular: Satisfactory opacification of the pulmonary arteries to the segmental level. No evidence of pulmonary embolism. Normal heart size. No pericardial effusion. Mediastinum/Nodes: Negative for adenopathy or mass. Lungs/Pleura: Generalized airway thickening. A small pulmonary cyst is seen at the left base. There is no edema, consolidation, effusion, or pneumothorax. Upper Abdomen: Severe liver steatosis. Musculoskeletal: Nonacute anterior bilateral rib fractures. Review of the MIP images confirms the above findings. IMPRESSION: 1. Negative for pulmonary embolism or other acute finding. 2. Generalized bronchitic airway thickening. 3. Severe hepatic steatosis. Electronically Signed   By: Monte Fantasia M.D.   On: 05/03/2020 05:18   CT CERVICAL SPINE WO CONTRAST  Result Date: 05/03/2020 CLINICAL DATA:  Alcohol withdrawal seizure. EXAM: CT HEAD WITHOUT CONTRAST CT CERVICAL SPINE WITHOUT CONTRAST TECHNIQUE: Multidetector CT imaging of the head and cervical spine was performed following the standard protocol without intravenous contrast. Multiplanar CT image reconstructions of the cervical spine were also generated. COMPARISON:  None. FINDINGS: CT HEAD FINDINGS Brain: There is no mass, hemorrhage or extra-axial collection.  The size and configuration of the ventricles and extra-axial CSF spaces are normal. The brain parenchyma is normal, without evidence of acute or chronic infarction. Vascular: No abnormal hyperdensity of the major intracranial arteries or dural venous sinuses. No intracranial atherosclerosis. Skull: The visualized skull base, calvarium and extracranial soft tissues are normal. Sinuses/Orbits: Intermediate density secretions  within the right maxillary sinus. The orbits are normal. CT CERVICAL SPINE FINDINGS Alignment: No static subluxation. Facets are aligned. Occipital condyles are normally positioned. Skull base and vertebrae: No acute fracture. Soft tissues and spinal canal: No prevertebral fluid or swelling. No visible canal hematoma. Disc levels: There is left C6 foraminal narrowing. Upper chest: No pneumothorax, pulmonary nodule or pleural effusion. Other: Normal visualized paraspinal cervical soft tissues. IMPRESSION: 1. No acute intracranial abnormality. 2. No acute fracture or static subluxation of the cervical spine. Electronically Signed   By: Ulyses Jarred M.D.   On: 05/03/2020 01:37   US Abdomen Complete  Result Date: 05/03/2020 CLINICAL DATA:  Right-sided pain EXAM: ABDOMEN ULTRASOUND COMPLETE COMPARISON:  Abdominal ultrasound October 06, 2019 FINDINGS: Gallbladder: Sludge is present in the gallbladder. There are no gallstones, gallbladder wall thickening, or pericholecystic fluid. No sonographic Murphy sign noted by sonographer. Common bile duct: Diameter: 4 mm. No intrahepatic or extrahepatic biliary duct dilatation. Liver: No focal lesion identified. Liver echogenicity is increased diffusely. Portal vein is patent on color Doppler imaging with normal direction of blood flow towards the liver. IVC: No abnormality visualized. Pancreas: Visualized portion unremarkable. Portions of pancreas obscured by gas. Spleen: Size and appearance within normal limits. Right Kidney: Length: 12.3 cm. Echogenicity within normal limits. No mass or hydronephrosis visualized. Left Kidney: Length: 12.7 cm. Echogenicity within normal limits. No mass or hydronephrosis visualized. Abdominal aorta: No aneurysm visualized. Other findings: No evident ascites. IMPRESSION: 1. Sludge in gallbladder. No evident gallstones, gallbladder wall thickening, or pericholecystic fluid. 2. Diffuse increase in liver echogenicity, a finding indicative of  hepatic steatosis. No focal liver lesions are evident; it must be cautioned that the sensitivity of ultrasound for detection of focal liver lesions is diminished in this circumstance. 3. Portions of pancreas obscured by gas. Visualized portions of pancreas appear unremarkable. 4.  Study otherwise unremarkable. Electronically Signed   By: Lowella Grip III M.D.   On: 05/03/2020 10:08   DG CHEST PORT 1 VIEW  Result Date: 05/04/2020 CLINICAL DATA:  Dyspnea EXAM: PORTABLE CHEST 1 VIEW COMPARISON:  05/03/2020 FINDINGS: Mild lingular linear airspace disease likely reflecting atelectasis. No focal consolidation. No pleural effusion or pneumothorax. Heart and mediastinal contours are unremarkable. No acute osseous abnormality. IMPRESSION: No acute cardiopulmonary disease. Electronically Signed   By: Kathreen Devoid   On: 05/04/2020 11:51   DG Chest Port 1 View  Result Date: 05/03/2020 CLINICAL DATA:  Edema possible seizure short of breath EXAM: PORTABLE CHEST 1 VIEW COMPARISON:  10/05/2019 FINDINGS: The heart size and mediastinal contours are within normal limits. Both lungs are clear. The visualized skeletal structures are unremarkable. IMPRESSION: No active disease. Electronically Signed   By: Donavan Foil M.D.   On: 05/03/2020 00:25   EEG adult  Result Date: 05/03/2020 Lora Havens, MD     05/03/2020 11:55 AM Patient Name: Roosevelt Eimers MRN: [id number] Epilepsy Attending: Lora Havens Referring Physician/Provider: Dr Harrold Donath Date: 05/03/2020 Duration: 24.37 mins Patient history: 49yo m with syncope. EEG to evaluate for seizure. Level of alertness: Awake, asleep AEDs during EEG study: Lorazepam Technical aspects: This EEG study was done with scalp electrodes  positioned according to the 10-20 International system of electrode placement. Electrical activity was acquired at a sampling rate of 500Hz  and reviewed with a high frequency filter of 70Hz  and a low frequency filter of 1Hz . EEG data  were recorded continuously and digitally stored. Description: No posterior dominant rhythm was seen. Sleep was characterized by vertex waves, sleep spindles (12 to 14 Hz), maximal frontocentral region.  There is an excessive amount of 15 to 18 Hz beta activity with irregular morphology distributed symmetrically and diffusely. Physiologic photic driving was seen during photic stimulation.  Hyperventilation was not performed.   ABNORMALITY -Excessive beta, generalized IMPRESSION: This study is within normal limits. No seizures or epileptiform discharges were seen throughout the recording. The excessive beta activity seen in the background is most likely due to the effect of benzodiazepine and is a benign EEG pattern. Priyanka [name]      Subjective: Patient seen and examined bedside, resting comfortably.  Continues with overall weakness.  Discussed with him once again needs to abstain from all alcohol use, otherwise he will continue to deteriorate with progression of his alcoholic hepatitis to cirrhosis.  Patient acknowledged this.  No other questions or concerns at this time.  Denies headache, no dizziness, no chest pain, palpitations, no shortness of breath, no abdominal pain.  No acute events overnight per nursing staff.  Discharge Exam: Vitals:   05/07/20 0100 05/07/20 0512  BP: 106/72 110/75  Pulse: 84 89  Resp: 18 18  Temp: 98 F (36.7 C) 97.8 F (36.6 C)  SpO2: 94% 95%   Vitals:   05/06/20 0914 05/06/20 0915 05/07/20 0100 05/07/20 0512  BP: 133/85 133/85 106/72 110/75  Pulse: (!) 109 (!) 105 84 89  Resp:   18 18  Temp:   98 F (36.7 C) 97.8 F (36.6 C)  TempSrc:   Oral Oral  SpO2: 98%  94% 95%  Weight:      Height:        General: Pt is alert, awake, not in acute distress, appears older than stated age Cardiovascular: RRR, S1/S2 +, no rubs, no gallops Respiratory: CTA bilaterally, no wheezing, no rhonchi, oxygenating well on room air Abdominal: Soft, NT, ND, bowel sounds  + Extremities: no edema, no cyanosis    The results of significant diagnostics from this hospitalization (including imaging, microbiology, ancillary and laboratory) are listed below for reference.     Microbiology: Recent Results (from the past 240 hour(s))  SARS Coronavirus 2 by RT PCR (hospital order, performed in [facility] hospital lab) Nasopharyngeal Nasopharyngeal Swab     Status: None   Collection Time: 05/03/20 12:23 AM   Specimen: Nasopharyngeal Swab  Result Value Ref Range Status   SARS Coronavirus 2 NEGATIVE NEGATIVE Final    Comment: (NOTE) SARS-CoV-2 target nucleic acids are NOT DETECTED.  The SARS-CoV-2 RNA is generally detectable in upper and lower respiratory specimens during the acute phase of infection. The lowest concentration of SARS-CoV-2 viral copies this assay can detect is 250 copies / mL. A negative result does not preclude SARS-CoV-2 infection and should not be used as the sole basis for treatment or other patient management decisions.  A negative result may occur with improper specimen collection / handling, submission of specimen other than nasopharyngeal swab, presence of viral mutation(s) within the areas targeted by this assay, and inadequate number of viral copies (<250 copies / mL). A negative result must be combined with clinical observations, patient history, and epidemiological information.  Fact Sheet for Patients:  StrictlyIdeas.no  Fact Sheet for Healthcare Providers: BankingDealers.co.za  This test is not yet approved or  cleared by the [place] FDA and has been authorized for detection and/or diagnosis of SARS-CoV-2 by FDA under an Emergency Use Authorization (EUA).  This EUA will remain in effect (meaning this test can be used) for the duration of the COVID-19 declaration under Section 564(b)(1) of the Act, 21 U.S.C. section 360bbb-3(b)(1), unless the authorization is terminated  or revoked sooner.  Performed at [facility], [place] [address], [place], [place] [address]      Labs: BNP (last 3 results) Recent Labs    10/05/19 1319 05/03/20 0005 05/04/20 1515  BNP 115.4* 128.7* 564.3*   Basic Metabolic Panel: Recent Labs  Lab 05/03/20 0005 05/03/20 0420 05/04/20 0553 05/04/20 1515 05/04/20 2034 05/05/20 0313 05/05/20 1845 05/06/20 0056 05/07/20 0230  NA 129*   < > 130*  --  133* 134*  --  133* 136  K <2.0*   < > 2.3*   < > 3.2* 3.1* 3.2* 3.0* 3.2*  CL 73*   < > 82*  --  86* 87*  --  89* 95*  CO2 35*   < > 43*  --  36* 38*  --  33* 32  GLUCOSE 127*   < > 117*  --  110* 100*  --  130* 105*  BUN 6   < > 11  --  7 5*  --  <5* 5*  CREATININE 0.72   < > 0.75  --  0.69 0.64  --  0.75 0.76  CALCIUM 7.6*   < > 7.8*  --  7.8* 7.9*  --  8.3* 8.5*  MG 1.4*  --  1.6*  --   --  2.0  --  1.7 1.7   < > = values in this interval not displayed.   Liver Function Tests: Recent Labs  Lab 05/04/20 0553 05/04/20 2034 05/05/20 0313 05/06/20 0056 05/07/20 0230  AST 241* 248* 236* 205* 165*  ALT 84* 82* 83* 76* 66*  ALKPHOS 136* 125 133* 145* 156*  BILITOT 9.5* 9.5* 8.7* 8.6* 9.5*  PROT 5.0* 5.0* 4.8* 5.1* 5.1*  ALBUMIN 1.7* 1.7* 1.7* 1.8* 1.7*   No results for input(s): LIPASE, AMYLASE in the last 168 hours. Recent Labs  Lab 05/03/20 0005  AMMONIA 46*   CBC: Recent Labs  Lab 05/03/20 0005 05/03/20 0005 05/03/20 0420 05/03/20 0420 05/04/20 0553 05/04/20 0553 05/04/20 1515 05/05/20 0313 05/05/20 1845 05/06/20 0056 05/07/20 0230  WBC 11.3*   < > 10.2  --  13.2*  --   --  13.6*  --  19.2* 15.6*  NEUTROABS 8.1*  --   --   --   --   --   --   --   --   --   --   HGB 8.2*   < > 7.4*   < > 6.5*   < > 6.8* 7.0* 8.2* 8.1* 8.0*  HCT 23.5*   < > 20.9*   < > 18.2*   < > 19.7* 20.3* 24.1* 23.6* 24.3*  MCV 93.3   < > 93.3  --  95.3  --   --  94.0  --  95.2 97.6  PLT 384   < > 341  --  281  --   --  215  --  225 196   < > = values in this interval  not displayed.   Cardiac Enzymes: No results for  input(s): CKTOTAL, CKMB, CKMBINDEX, TROPONINI in the last 168 hours. BNP: Invalid input(s): POCBNP CBG: No results for input(s): GLUCAP in the last 168 hours. D-Dimer No results for input(s): DDIMER in the last 72 hours. Hgb A1c No results for input(s): HGBA1C in the last 72 hours. Lipid Profile No results for input(s): CHOL, HDL, LDLCALC, TRIG, CHOLHDL, LDLDIRECT in the last 72 hours. Thyroid function studies No results for input(s): TSH, T4TOTAL, T3FREE, THYROIDAB in the last 72 hours.  Invalid input(s): FREET3 Anemia work up No results for input(s): VITAMINB12, FOLATE, FERRITIN, TIBC, IRON, RETICCTPCT in the last 72 hours. Urinalysis    Component Value Date/Time   COLORURINE YELLOW 10/05/2019 1700   APPEARANCEUR CLEAR 10/05/2019 1700   LABSPEC 1.006 10/05/2019 1700   PHURINE 7.0 10/05/2019 1700   GLUCOSEU NEGATIVE 10/05/2019 1700   HGBUR NEGATIVE 10/05/2019 1700   BILIRUBINUR NEGATIVE 10/05/2019 1700   KETONESUR NEGATIVE 10/05/2019 1700   PROTEINUR NEGATIVE 10/05/2019 1700   UROBILINOGEN 1.0 12/24/2014 0643   NITRITE NEGATIVE 10/05/2019 1700   LEUKOCYTESUR NEGATIVE 10/05/2019 1700   Sepsis Labs Invalid input(s): PROCALCITONIN,  WBC,  LACTICIDVEN Microbiology Recent Results (from the past 240 hour(s))  SARS Coronavirus 2 by RT PCR (hospital order, performed in [facility] hospital lab) Nasopharyngeal Nasopharyngeal Swab     Status: None   Collection Time: 05/03/20 12:23 AM   Specimen: Nasopharyngeal Swab  Result Value Ref Range Status   SARS Coronavirus 2 NEGATIVE NEGATIVE Final    Comment: (NOTE) SARS-CoV-2 target nucleic acids are NOT DETECTED.  The SARS-CoV-2 RNA is generally detectable in upper and lower respiratory specimens during the acute phase of infection. The lowest concentration of SARS-CoV-2 viral copies this assay can detect is 250 copies / mL. A negative result does not preclude SARS-CoV-2  infection and should not be used as the sole basis for treatment or other patient management decisions.  A negative result may occur with improper specimen collection / handling, submission of specimen other than nasopharyngeal swab, presence of viral mutation(s) within the areas targeted by this assay, and inadequate number of viral copies (<250 copies / mL). A negative result must be combined with clinical observations, patient history, and epidemiological information.  Fact Sheet for Patients:   StrictlyIdeas.no  Fact Sheet for Healthcare Providers: BankingDealers.co.za  This test is not yet approved or  cleared by the [place] FDA and has been authorized for detection and/or diagnosis of SARS-CoV-2 by FDA under an Emergency Use Authorization (EUA).  This EUA will remain in effect (meaning this test can be used) for the duration of the COVID-19 declaration under Section 564(b)(1) of the Act, 21 U.S.C. section 360bbb-3(b)(1), unless the authorization is terminated or revoked sooner.  Performed at [facility], [place] [address], [place], [place] [address]      Time coordinating discharge: Over 30 minutes  SIGNED:   Laketa Sandoz J [place] DO  [facility] 05/07/2020, 10:01 AM

## 2020-05-07 NOTE — Discharge Instructions (Signed)
Alcoholic Hepatitis  Alcoholic hepatitis is liver inflammation that is caused by drinking a lot of alcohol over a long period of time. This inflammation decreases the liver's ability to function normally. This condition requires you to stop drinking alcohol permanently to prevent further damage. What are the causes? Alcoholic hepatitis is caused by long-term (chronic) heavy alcohol use. The liver filters alcohol out of the bloodstream. When alcohol gets divided into small particles (broken down) in the liver, substances are produced that can damage liver cells. This causes destruction of liver cells and inflammation. What increases the risk? The following factors may make you more likely to develop this condition:  Regularly drinking large amounts of alcohol, especially in a short amount of time (binge drinking).  Drinking heavily for years.  Being male.  Being obese.  Having had a hepatitis infection in the past.  Having a liver problem that you were born with (genetic liver disease).  Having a lack (deficiency) of certain nutrients, such as folate or thiamine.  Having a parent or sibling who has alcoholic hepatitis. What are the signs or symptoms? Symptoms of this condition include:  Pain and swelling in the abdomen.  Loss of appetite.  Losing weight without trying.  Nausea and vomiting.  Diarrhea.  Fever.  Fatigue.  Yellowing of the skin and the whites of the eyes (jaundice).  Veins that you can see ("spider veins"), especially in the abdomen.  Bleeding easily, such as excessive bleeding from a minor cut.  Itching.  Trouble thinking clearly.  Memory problems.  Mood changes.  Confusion. How is this diagnosed? This condition may be diagnosed with:  A physical exam and a review of medical history.  Blood tests to check liver function.  Tests that create detailed images of the body. These may include: ? A liver ultrasound. ? CT scan. ? MRI.  A  liver biopsy. For this test, a small sample of liver tissue is removed and checked for signs of liver damage. How is this treated? The most important part of treatment is to stop drinking alcohol. If you are addicted to alcohol, your health care provider will help you make a plan to quit. This plan may involve:  Taking medicine to decrease unpleasant symptoms that are caused by stopping or decreasing alcohol use (withdrawal symptoms).  Entering a treatment program to help you stop drinking.  Joining a support group. Treatment for alcoholic hepatitis may also include:  Steroid medicines to reduce inflammation.  Nutritional therapy. Your health care provider or a diet and nutrition specialist (dietitian) may recommend: ? Eating a healthy diet. ? Eating specific foods that contain vitamins and minerals to help you maintain nutrient levels in your body. ? Taking vitamins and dietary supplements to make sure you maintain nutrient levels in your body.  Receiving a donated liver (liver transplant). This is only done in very severe cases, and only for people who have completely stopped drinking and can commit to never drinking alcohol again. Follow these instructions at home:   Do not drink alcohol. Follow your treatment plan, and work with your health care provider as needed.  Consider joining an alcohol support group. These groups can provide emotional support and guidance.  Take over-the-counter and prescription medicines only as told by your health care provider. These include vitamins and supplements.  Do not use medicines or eat foods that contain alcohol unless told by your health care provider.  Follow instructions from your health care provider or dietitian about nutritional therapy.    Keep all follow-up visits as told by your health care provider. This is important. Contact a health care provider if:  You have a fever.  You have a decreased appetite.  You have flu-like  symptoms such as fatigue, weakness, or muscle aches.  You have nausea or vomiting.  You bruise easily.  Your urine is very dark.  You develop new pain in your abdomen. Get help right away if:  You vomit blood.  You develop jaundice.  You have severely itchy skin.  Your legs swell.  Your abdomen suddenly swells.  You have stools that are black, tar-like, or bloody.  You bleed easily, such as excessive bleeding from a minor cut.  You are confused or not thinking clearly.  You have a seizure. Summary  Alcoholic hepatitis is liver inflammation that is caused by drinking a lot of alcohol over a long period of time.  Alcoholic hepatitis is diagnosed with blood tests that check liver function.  The most important part of treatment is to stop drinking alcohol. Follow your treatment plan, and work with your health care provider as needed. This information is not intended to replace advice given to you by your health care provider. Make sure you discuss any questions you have with your health care provider. Document Revised: 11/23/2018 Document Reviewed: 04/16/2017 Elsevier Patient Education  [place]. Alcohol Intoxication Alcohol intoxication occurs when a person no longer thinks clearly or functions well (becomes impaired) after drinking alcohol. Intoxication can occur with just one drink. The legal definition of alcohol intoxication depends on the amount of alcohol in the blood (blood alcohol concentration, BAC). BAC of 80-100 mg/dL or higher is commonly considered legally intoxicated. The level of impairment depends on:  The amount of alcohol the person had.  The person's age, gender, and weight.  How often the person drinks.  Whether the person has other medical conditions, such as diabetes, seizures, or a heart condition. Alcohol intoxication can range from mild to severe. The condition can be dangerous, especially if the person:  Also took certain drugs or  prescription medicines.  Drinks a large amount of alcohol in a short period of time (binge drinks). ? For women, binge drinking is having four or more drinks at one time. ? For men, binge drinking is having five or more drinks at one time. If you or anyone around you appears intoxicated, speak up and act. What are the causes? This condition is caused by drinking alcohol. What increases the risk? The following factors may make you more likely to develop this condition:  Peer pressure in young adults.  Difficulty managing stress.  History of drug or alcohol abuse.  Combining alcohol with drugs.  Family history of drug or alcohol abuse.  Low body weight.  Binge drinking. What are the signs or symptoms? Symptoms of alcohol intoxication can vary from person to person. Symptoms can be mild, moderate, or severe. Symptoms of mild alcohol intoxication may include:  Feeling relaxed or sleepy.  Having mild difficulty with coordination, speech, memory, or attention. Symptoms of moderate alcohol intoxication may include:  Extreme emotions, like anger or sadness.  Moderate difficulty with coordination, speech, memory, or attention. Symptoms of severe alcohol intoxication may include:  Severe difficulty with coordination, speech, memory, or attention.  Passing out.  Vomiting.  Confusion.  Slow breathing.  Coma. Intoxication can change quickly from mild to severe. It can cause coma or death, especially in people who are not exposed to alcohol often. How is this  diagnosed? Your health care provider will ask you how much alcohol you drank and what kind you had. Intoxication may also be diagnosed based on:  Your symptoms and medical history.  A physical exam.  A blood test that measures BAC.  A smell of alcohol on your breath. How is this treated? Treatment for alcohol intoxication may include:  Being monitored in an emergency department, hospital, or treatment center  until your [facility] comes down and it is safe for you to go home.  IV fluids to prevent or treat loss of fluid in the body (dehydration).  Medicine to treat nausea or vomiting or to get rid of alcohol in the body.  Counseling (brief intervention) about the dangers of using alcohol.  Treatment for substance use disorder.  Oxygen therapy or a breathing machine (ventilator). Long-term (chronic) exposure to alcohol can have long-term effects on your brain, heart, and gastrointestinal system. These effects can be serious and may also require treatment. Follow these instructions at home:  Eating and drinking   Do not drink alcohol if: ? Your health care provider tells you not to drink. ? You are pregnant, may be pregnant, or are planning to become pregnant. ? You are under the legal drinking age (47 years old in the [place] ? You are taking medicines that should not be taken with alcohol. ? You have a medical condition, and alcohol makes it worse. ? You need to drive or perform activities that require you to be alert. ? You have substance use disorder.  Ask your health care provider if alcohol is safe for you. If your health care provider allows you to drink alcohol, limit how much you have. You may drink: ? 0-1 drink a day for women. ? 0-2 drinks a day for men.  Be aware of how much alcohol is in your drink. In the [place] one drink equals one 12 oz bottle of beer (355 mL), one 5 oz glass of wine (148 mL), or one 1 oz shot of hard liquor (44 mL).  Avoid drinking alcohol on an empty stomach.  Stay hydrated. Drink enough fluid to keep your urine pale yellow. Avoid caffeine because it can dehydrate you.  Avoid drinking more than one drink per hour.  When having multiple drinks, drink water or a non-alcoholic beverage between alcoholic drinks. General instructions  Take over-the-counter and prescription medicines only as told by your health care provider.  Do not drive after drinking any  amount of alcohol. Plan for a designated driver or another way to go home.  Have someone responsible stay with you while you are intoxicated. You should not be left alone.  Keep all follow-up visits as told by your health care provider. This is important. Contact a health care provider if:  You do not feel better after a few days.  You have problems at work, at school, or at home due to drinking. Get help right away if:  You have any of the following: ? Moderate to severe trouble with coordination, speech, memory, or attention. ? Trouble staying awake. ? Severe confusion. ? A seizure. ? Light-headedness. ? Fainting. ? Vomiting bright red blood or material that looks like coffee grounds. ? Bloody stool (feces). The blood may make your stool bright red, black, or tarry. It may also smell bad. ? Shakiness when trying to stop drinking. ? Thoughts about hurting yourself or others. If you ever feel like you may hurt yourself or others, or have thoughts about taking your own  life, get help right away. You can go to your nearest emergency department or call:  Your local emergency services (911 in the [place]  A suicide crisis helpline, such as the [facility] at 7272582603. This is open 24 hours a day. Summary  Alcohol intoxication occurs when a person no longer thinks clearly or functions well after drinking alcohol.  If your health care provider says that alcohol is safe for you, limit alcohol intake to no more than 1 drink a day for women (no drinks if you are pregnant) and 2 drinks a day for men. One drink equals 12 oz of beer, 5 oz of wine, or 1 oz of hard liquor.  Contact your health care provider if drinking has caused you problems at work, school, or home.  Get help right away if you have thoughts about hurting yourself or others. This information is not intended to replace advice given to you by your health care provider. Make sure you discuss any  questions you have with your health care provider. Document Revised: 11/24/2017 Document Reviewed: 11/24/2017 Elsevier Patient Education  [place].

## 2020-05-07 NOTE — Progress Notes (Signed)
RN gave pt discharge instructions and he stated understanding. Ivs have been removed and belongings packed, pt dressed and waiting for his ride

## 2020-05-08 ENCOUNTER — Ambulatory Visit: Payer: Medicare Other

## 2020-05-08 ENCOUNTER — Encounter (HOSPITAL_COMMUNITY): Payer: Self-pay | Admitting: Gastroenterology

## 2020-05-08 ENCOUNTER — Telehealth: Payer: Self-pay

## 2020-05-08 NOTE — Telephone Encounter (Signed)
Transition Care Management Follow-up Telephone Call  Date of discharge and from where: 05/07/2020, [facility]  How have you been since you were released from the hospital? He said that he is not too bad but he has felt a little dizzy and fatigues easily.   He stated that that he has not been drinking alcohol and understands the need to avoid alcohol. Discussed when to return to the ED.  Any questions or concerns?  noted above  Items Reviewed:  Did the pt receive and understand the discharge instructions provided?  yes  Medications obtained and verified?  he said that he does not have any of the vitamins and his pharmacy only had 2 librium pills yesterday so he will need to pick up the rest of the librium today.  He said he has all of the other medications and did not have any questions about his med regime.   Any new allergies since your discharge?  none reported   Do you have support at home?  lives alone but said that his sister helps him a lot.   No home health or DME ordered  Functional Questionnaire: (I = Independent and D = Dependent) ADLs:independent. Has walker to use when needed and he said he is using it most of the time now.   Follow up appointments reviewed:   PCP Hospital f/u appt confirmed?.Dr Juleen China - 05/15/2020  Specialist Hospital f/u appt confirmed?none scheduled at this time  If their condition worsens, is the pt aware to call PCP or go to the Emergency Dept.?  yes  Was the patient provided with contact information for the PCP's office or ED?  he has the phone number for the clinic  Was to pt encouraged to call back with questions or concerns?  yes

## 2020-05-14 ENCOUNTER — Telehealth: Payer: Self-pay

## 2020-05-14 NOTE — Telephone Encounter (Signed)
Returning phone call from front office on rescheduling 9/28 appointments. Patient's new appointment has been rescheduled.

## 2020-05-15 ENCOUNTER — Ambulatory Visit: Payer: Medicare Other | Admitting: Internal Medicine

## 2020-05-28 ENCOUNTER — Ambulatory Visit (INDEPENDENT_AMBULATORY_CARE_PROVIDER_SITE_OTHER): Payer: Medicare Other | Admitting: Internal Medicine

## 2020-05-28 ENCOUNTER — Encounter: Payer: Self-pay | Admitting: Internal Medicine

## 2020-05-28 ENCOUNTER — Other Ambulatory Visit: Payer: Self-pay

## 2020-05-28 VITALS — BP 107/72 | HR 94 | Temp 99.1°F | Resp 17 | Wt 217.0 lb

## 2020-05-28 DIAGNOSIS — R55 Syncope and collapse: Secondary | ICD-10-CM | POA: Diagnosis not present

## 2020-05-28 DIAGNOSIS — F1010 Alcohol abuse, uncomplicated: Secondary | ICD-10-CM | POA: Diagnosis not present

## 2020-05-28 DIAGNOSIS — Z09 Encounter for follow-up examination after completed treatment for conditions other than malignant neoplasm: Secondary | ICD-10-CM

## 2020-05-28 DIAGNOSIS — M7989 Other specified soft tissue disorders: Secondary | ICD-10-CM | POA: Diagnosis not present

## 2020-05-28 DIAGNOSIS — E876 Hypokalemia: Secondary | ICD-10-CM

## 2020-05-28 DIAGNOSIS — Z1211 Encounter for screening for malignant neoplasm of colon: Secondary | ICD-10-CM

## 2020-05-28 NOTE — Progress Notes (Signed)
Subjective:    [name] - 49 y.o. male MRN [id number]  Date of birth: 1970/09/19  HPI  Marlon Suleiman is here for hospital follow up. Was hospitalized from 9/15 to 9/20 for syncopal episode related to underlying EtOH intoxication and dependence with alcoholic hepatitis with esophageal varices.    Recommendations for Outpatient Follow-up:  1. Follow up with PCP in 1-2 weeks 2. Continue Librium taper on discharge 3. Started on sertraline 25 mg p.o. daily and Seroquel 25 mg p.o. nightly 4. Please obtain BMP in one week to evaluate potassium level 5. Continue to encourage EtOH cessation 6. Outpatient follow-up with gastroenterology for colorectal cancer screening with colonoscopy    Hospital course:  Syncopal episode Patient presented to the ED after having syncopal episode at home. History of seizures from alcohol withdrawal. Patient was found to be intoxicated following heavy drinking over the past several days from underlying alcohol dependence. Also notable for severe electrolyte derangements with hyponatremia and potassium less than 2. UDS positive for THC. EtOH level 144. Elevated D-dimer on admission with CT angiogram chest negative for PE. CT head/C-spine negative for acute intracranial abnormality or fracture/subluxation. EEG without seizure or epileptiform discharges. Etiology likely secondary to severe electrolyte disturbance as well as intoxication.  Patient was monitored on telemetry during hospitalization without any concerning arrhythmias noted.  Discussed alcohol cessation.  Alcoholic hepatitis with esophageal varices EtOH intoxication/dependence Patient with longstanding history of alcohol dependence. Binge drinking prior to admission. Was found to have alcohol level elevated to 144. CT angiogram chest with findings of severe steatosis liver. LFTs on admission with AST/ALT ratio consistent with alcohol abuse. Ultrasound abdomen with sludge in  gallbladder, no gallstones or gallbladder wall thickening, increased echogenicity indicative of hepatic steatosis but no focal liver lesions evident. EGD 05/05/2020 with grade 1 varices lower third of esophagus, less than 5 mm in largest diameter, mild portal hypertensive gastropathy and mildly erythematous mucosa without active bleeding duodenal bulb. Continue Protonix 40 mg p.o. daily. Outpatient follow-up with gastroenterology for consideration of colorectal screening with colonoscopy.  Continue encourage EtOH cessation.  Continue thiamine, folic acid, multivitamin.  Hypokalemia, severe Hypomagnesemia Potassium less than 2 on admission, likely secondary to severe alcohol abuse. Repleted K and Mg aggressively during hospitalization.  Recommend repeat BMP at next visit.  Hyponatremia Sodium 129 admission, likely secondary to beer potomania.  Sodium 136 at time of discharge.  Repeat BMP at next visit.  EtOH cessation.  Anemia Folic acid deficiency Iron 122, TIBC 129, ferritin 2307, folate low at 2.9.  Patient was transfused 1 unit PRBC on 9/17 and 9/19.  Continue folic acid on discharge.  Repeat CBC next visit  Essential hypertension Continue metoprolol tartrate 12.5 mg p.o. twice daily  GERD: Continue Protonix 40 mg p.o. daily  Schizoaffective disorder Psychiatry consulted on 05/03/2020. Seroquel 50 mg p.o. nightly and Zoloft 25 mg p.o. daily  Tobacco use disorder: Discussed with patientneed for cessation    Health Maintenance:  There are no preventive care reminders to display for this patient.  -  reports that he has been smoking cigarettes. He has a 45.00 pack-year smoking history. He has never used smokeless tobacco. - Review of Systems: Per HPI. - Past Medical History: Patient Active Problem List   Diagnosis Date Noted  . Alcoholic cirrhosis of liver with ascites ([place])   . Hypomagnesemia 05/03/2020  . Hyperbilirubinemia 05/03/2020  . D-dimer, elevated 05/03/2020  .  Alcoholism ([place]) 05/03/2020  . Rhabdomyolysis 03/06/2019  . Alcoholic hepatitis without ascites 03/06/2019  .  Alcohol withdrawal ([place]) 01/15/2019  . HTN (hypertension) 01/10/2019  . Fall 01/10/2019  . Alcohol intoxication ([place]) 01/10/2019  . Schizoaffective disorder, bipolar type ([place]) 06/02/2018  . Alcohol dependence with uncomplicated withdrawal ([place]) 02/22/2016  . Alcohol-induced mood disorder ([place]) 02/22/2016  . Gout 10/22/2015  . GERD (gastroesophageal reflux disease) 10/22/2015  . Alcohol withdrawal seizure with complication ([place]) 31/51/7616  . Schizoaffective disorder, depressive type ([place])   . History of alcohol abuse   . Tobacco dependence due to cigarettes 11/20/2014  . Alcohol abuse with intoxication ([place])   . Hyponatremia 10/18/2014  . Abnormal LFTs   . Alcohol abuse 02/09/2014  . Post traumatic stress disorder (PTSD) 11/03/2011  . Hypokalemia 07/02/2011   - Medications: reviewed and updated   Objective:   Physical Exam BP 107/72   Pulse 94   Temp 99.1 F (37.3 C) (Temporal)   Resp 17   Wt 217 lb (98.4 kg)   SpO2 96%   BMI 29.43 kg/m  Physical Exam Constitutional:      General: He is not in acute distress.    Appearance: He is not diaphoretic.  Cardiovascular:     Rate and Rhythm: Normal rate.  Pulmonary:     Effort: Pulmonary effort is normal. No respiratory distress.  Musculoskeletal:        General: Normal range of motion.  Skin:    General: Skin is warm and dry.  Neurological:     Mental Status: He is alert and oriented to person, place, and time.  Psychiatric:        Mood and Affect: Affect normal.        Judgment: Judgment normal.            Assessment & Plan:   1. Hospital discharge follow-up  2. Alcohol abuse Has not continued to drink since leaving hospital. No withdrawal symptoms. Completed course of Librium. Discussed resources for continued sobriety. Reports not interested in AA or therapy at present.   3. Leg  swelling Resolved.   4. Syncope, unspecified syncope type Resolved.   5. Hypokalemia K 3.2 at time of discharge. Will repeat.  - Basic metabolic panel  6. Screening for colon cancer - Ambulatory referral to Gastroenterology    Phill Myron, D.O. 06/05/2020, 2:37 PM Primary Care at [facility]

## 2020-05-29 LAB — BASIC METABOLIC PANEL
BUN/Creatinine Ratio: 10 (ref 9–20)
BUN: 6 mg/dL (ref 6–24)
CO2: 25 mmol/L (ref 20–29)
Calcium: 9 mg/dL (ref 8.7–10.2)
Chloride: 99 mmol/L (ref 96–106)
Creatinine, Ser: 0.62 mg/dL — ABNORMAL LOW (ref 0.76–1.27)
GFR calc Af Amer: 137 mL/min/{1.73_m2} (ref >59)
GFR calc non Af Amer: 118 mL/min/{1.73_m2} (ref >59)
Glucose: 105 mg/dL — ABNORMAL HIGH (ref 65–99)
Potassium: 4.2 mmol/L (ref 3.5–5.2)
Sodium: 140 mmol/L (ref 134–144)

## 2020-05-30 NOTE — Progress Notes (Signed)
Patient notified of results & recommendations. Expressed understanding.

## 2020-06-01 ENCOUNTER — Telehealth: Payer: Self-pay | Admitting: Internal Medicine

## 2020-06-01 ENCOUNTER — Telehealth: Payer: Self-pay | Admitting: Licensed Clinical Social Worker

## 2020-06-01 NOTE — Telephone Encounter (Signed)
1) Medication(s) Requested (by name):pantoprazole (PROTONIX) 40 MG tablet  QUEtiapine (SEROQUEL) 50 MG folic acid (FOLVITE) 1 MG tablet [[id number] sertraline (ZOLOFT) 25 MG  metoprolol tartrate (LOPRESSOR) 25 MG tablet    2) [facility] [place]   3) Special Requests:   Approved medications will be sent to the pharmacy, we will reach out if there is an issue.  Requests made after 3pm may not be addressed until the following business day!  If a patient is unsure of the name of the medication(s) please note and ask patient to call back when they are able to provide all info, do not send to responsible party until all information is available!

## 2020-06-01 NOTE — Telephone Encounter (Signed)
Call placed to patient to assist with resources for alcohol use. LCSW left message requesting a return call.

## 2020-06-06 ENCOUNTER — Other Ambulatory Visit: Payer: Self-pay | Admitting: Internal Medicine

## 2020-06-06 DIAGNOSIS — K219 Gastro-esophageal reflux disease without esophagitis: Secondary | ICD-10-CM

## 2020-06-06 DIAGNOSIS — I10 Essential (primary) hypertension: Secondary | ICD-10-CM

## 2020-06-06 MED ORDER — SERTRALINE HCL 25 MG PO TABS: 25.0000 mg | ORAL_TABLET | Freq: Every day | ORAL | 0 refills | Status: DC

## 2020-06-06 MED ORDER — PANTOPRAZOLE SODIUM 40 MG PO TBEC: 40.0000 mg | DELAYED_RELEASE_TABLET | Freq: Every day | ORAL | 0 refills | Status: DC

## 2020-06-06 MED ORDER — METOPROLOL TARTRATE 25 MG PO TABS: 12.5000 mg | ORAL_TABLET | Freq: Two times a day (BID) | ORAL | 0 refills | Status: DC

## 2020-06-06 MED ORDER — FOLIC ACID 1 MG PO TABS: 1.0000 mg | ORAL_TABLET | Freq: Every day | ORAL | 0 refills | Status: AC

## 2020-06-06 MED ORDER — QUETIAPINE FUMARATE 50 MG PO TABS: 50.0000 mg | ORAL_TABLET | Freq: Every day | ORAL | 0 refills | Status: DC

## 2020-06-11 ENCOUNTER — Ambulatory Visit: Payer: Medicare Other

## 2020-06-14 NOTE — Telephone Encounter (Signed)
Is there any update on the resources patient was requesting for AA alternatives?

## 2020-06-15 NOTE — Telephone Encounter (Signed)
LCSW placed another call to patient in regards to behavioral health and/or resource needs. A message requesting a return call was left.

## 2020-07-11 ENCOUNTER — Other Ambulatory Visit: Payer: Self-pay

## 2020-07-11 DIAGNOSIS — I10 Essential (primary) hypertension: Secondary | ICD-10-CM

## 2020-07-11 DIAGNOSIS — K219 Gastro-esophageal reflux disease without esophagitis: Secondary | ICD-10-CM

## 2020-07-11 MED ORDER — PANTOPRAZOLE SODIUM 40 MG PO TBEC: 40.0000 mg | DELAYED_RELEASE_TABLET | Freq: Every day | ORAL | 0 refills | Status: DC

## 2020-07-11 MED ORDER — SERTRALINE HCL 25 MG PO TABS
25.0000 mg | ORAL_TABLET | Freq: Every day | ORAL | 0 refills | Status: DC
Start: 2020-07-11 — End: 2020-10-11

## 2020-07-11 MED ORDER — METOPROLOL TARTRATE 25 MG PO TABS: 12.5000 mg | ORAL_TABLET | Freq: Two times a day (BID) | ORAL | 0 refills | Status: DC

## 2020-07-16 ENCOUNTER — Inpatient Hospital Stay (HOSPITAL_COMMUNITY): Payer: Medicare Other

## 2020-07-16 ENCOUNTER — Inpatient Hospital Stay (HOSPITAL_COMMUNITY)
Admission: EM | Admit: 2020-07-16 | Discharge: 2020-08-06 | DRG: 870 | Disposition: A | Discharge status: Skilled Nursing Facility | Payer: Medicare Other | Attending: Internal Medicine | Admitting: Internal Medicine

## 2020-07-16 ENCOUNTER — Emergency Department (HOSPITAL_COMMUNITY): Payer: Medicare Other

## 2020-07-16 DIAGNOSIS — F17210 Nicotine dependence, cigarettes, uncomplicated: Secondary | ICD-10-CM | POA: Diagnosis present

## 2020-07-16 DIAGNOSIS — Z20822 Contact with and (suspected) exposure to covid-19: Secondary | ICD-10-CM | POA: Diagnosis present

## 2020-07-16 DIAGNOSIS — K760 Fatty (change of) liver, not elsewhere classified: Secondary | ICD-10-CM | POA: Diagnosis present

## 2020-07-16 DIAGNOSIS — D539 Nutritional anemia, unspecified: Secondary | ICD-10-CM | POA: Diagnosis present

## 2020-07-16 DIAGNOSIS — E876 Hypokalemia: Secondary | ICD-10-CM | POA: Diagnosis present

## 2020-07-16 DIAGNOSIS — Z978 Presence of other specified devices: Secondary | ICD-10-CM

## 2020-07-16 DIAGNOSIS — F1020 Alcohol dependence, uncomplicated: Secondary | ICD-10-CM | POA: Diagnosis present

## 2020-07-16 DIAGNOSIS — D689 Coagulation defect, unspecified: Secondary | ICD-10-CM | POA: Diagnosis not present

## 2020-07-16 DIAGNOSIS — A419 Sepsis, unspecified organism: Secondary | ICD-10-CM

## 2020-07-16 DIAGNOSIS — R569 Unspecified convulsions: Secondary | ICD-10-CM | POA: Diagnosis not present

## 2020-07-16 DIAGNOSIS — K219 Gastro-esophageal reflux disease without esophagitis: Secondary | ICD-10-CM | POA: Diagnosis present

## 2020-07-16 DIAGNOSIS — D6959 Other secondary thrombocytopenia: Secondary | ICD-10-CM | POA: Diagnosis present

## 2020-07-16 DIAGNOSIS — D638 Anemia in other chronic diseases classified elsewhere: Secondary | ICD-10-CM | POA: Diagnosis present

## 2020-07-16 DIAGNOSIS — J690 Pneumonitis due to inhalation of food and vomit: Secondary | ICD-10-CM | POA: Diagnosis not present

## 2020-07-16 DIAGNOSIS — R9401 Abnormal electroencephalogram [EEG]: Secondary | ICD-10-CM | POA: Diagnosis not present

## 2020-07-16 DIAGNOSIS — G40909 Epilepsy, unspecified, not intractable, without status epilepticus: Secondary | ICD-10-CM | POA: Diagnosis present

## 2020-07-16 DIAGNOSIS — F4310 Post-traumatic stress disorder, unspecified: Secondary | ICD-10-CM | POA: Diagnosis present

## 2020-07-16 DIAGNOSIS — Z7189 Other specified counseling: Secondary | ICD-10-CM | POA: Diagnosis not present

## 2020-07-16 DIAGNOSIS — E870 Hyperosmolality and hypernatremia: Secondary | ICD-10-CM | POA: Diagnosis not present

## 2020-07-16 DIAGNOSIS — R4182 Altered mental status, unspecified: Secondary | ICD-10-CM

## 2020-07-16 DIAGNOSIS — R680 Hypothermia, not associated with low environmental temperature: Secondary | ICD-10-CM | POA: Diagnosis present

## 2020-07-16 DIAGNOSIS — R739 Hyperglycemia, unspecified: Secondary | ICD-10-CM | POA: Diagnosis present

## 2020-07-16 DIAGNOSIS — R6521 Severe sepsis with septic shock: Secondary | ICD-10-CM | POA: Diagnosis present

## 2020-07-16 DIAGNOSIS — K766 Portal hypertension: Secondary | ICD-10-CM | POA: Diagnosis present

## 2020-07-16 DIAGNOSIS — I110 Hypertensive heart disease with heart failure: Secondary | ICD-10-CM | POA: Diagnosis present

## 2020-07-16 DIAGNOSIS — E872 Acidosis, unspecified: Secondary | ICD-10-CM

## 2020-07-16 DIAGNOSIS — R0902 Hypoxemia: Secondary | ICD-10-CM

## 2020-07-16 DIAGNOSIS — Z01818 Encounter for other preprocedural examination: Secondary | ICD-10-CM

## 2020-07-16 DIAGNOSIS — Z9911 Dependence on respirator [ventilator] status: Secondary | ICD-10-CM

## 2020-07-16 DIAGNOSIS — K7460 Unspecified cirrhosis of liver: Secondary | ICD-10-CM | POA: Diagnosis not present

## 2020-07-16 DIAGNOSIS — K7040 Alcoholic hepatic failure without coma: Secondary | ICD-10-CM | POA: Diagnosis present

## 2020-07-16 DIAGNOSIS — Z781 Physical restraint status: Secondary | ICD-10-CM

## 2020-07-16 DIAGNOSIS — J9602 Acute respiratory failure with hypercapnia: Secondary | ICD-10-CM | POA: Diagnosis present

## 2020-07-16 DIAGNOSIS — B9620 Unspecified Escherichia coli [E. coli] as the cause of diseases classified elsewhere: Secondary | ICD-10-CM | POA: Diagnosis present

## 2020-07-16 DIAGNOSIS — J1520 Pneumonia due to staphylococcus, unspecified: Secondary | ICD-10-CM | POA: Diagnosis present

## 2020-07-16 DIAGNOSIS — K7031 Alcoholic cirrhosis of liver with ascites: Secondary | ICD-10-CM

## 2020-07-16 DIAGNOSIS — N390 Urinary tract infection, site not specified: Secondary | ICD-10-CM | POA: Diagnosis present

## 2020-07-16 DIAGNOSIS — Z79899 Other long term (current) drug therapy: Secondary | ICD-10-CM

## 2020-07-16 DIAGNOSIS — L89301 Pressure ulcer of unspecified buttock, stage 1: Secondary | ICD-10-CM | POA: Diagnosis present

## 2020-07-16 DIAGNOSIS — E8809 Other disorders of plasma-protein metabolism, not elsewhere classified: Secondary | ICD-10-CM | POA: Diagnosis not present

## 2020-07-16 DIAGNOSIS — T68XXXA Hypothermia, initial encounter: Secondary | ICD-10-CM

## 2020-07-16 DIAGNOSIS — I8510 Secondary esophageal varices without bleeding: Secondary | ICD-10-CM | POA: Diagnosis present

## 2020-07-16 DIAGNOSIS — K7010 Alcoholic hepatitis without ascites: Secondary | ICD-10-CM | POA: Diagnosis not present

## 2020-07-16 DIAGNOSIS — Z515 Encounter for palliative care: Secondary | ICD-10-CM | POA: Diagnosis not present

## 2020-07-16 DIAGNOSIS — E874 Mixed disorder of acid-base balance: Secondary | ICD-10-CM | POA: Diagnosis present

## 2020-07-16 DIAGNOSIS — J9600 Acute respiratory failure, unspecified whether with hypoxia or hypercapnia: Secondary | ICD-10-CM

## 2020-07-16 DIAGNOSIS — Z6834 Body mass index (BMI) 34.0-34.9, adult: Secondary | ICD-10-CM

## 2020-07-16 DIAGNOSIS — F259 Schizoaffective disorder, unspecified: Secondary | ICD-10-CM | POA: Diagnosis present

## 2020-07-16 DIAGNOSIS — G928 Other toxic encephalopathy: Secondary | ICD-10-CM | POA: Diagnosis present

## 2020-07-16 DIAGNOSIS — N179 Acute kidney failure, unspecified: Secondary | ICD-10-CM | POA: Diagnosis present

## 2020-07-16 DIAGNOSIS — K3189 Other diseases of stomach and duodenum: Secondary | ICD-10-CM | POA: Diagnosis present

## 2020-07-16 DIAGNOSIS — K652 Spontaneous bacterial peritonitis: Secondary | ICD-10-CM | POA: Diagnosis not present

## 2020-07-16 DIAGNOSIS — I5032 Chronic diastolic (congestive) heart failure: Secondary | ICD-10-CM | POA: Diagnosis present

## 2020-07-16 DIAGNOSIS — K7682 Hepatic encephalopathy: Secondary | ICD-10-CM

## 2020-07-16 DIAGNOSIS — R402431 Glasgow coma scale score 3-8, in the field [EMT or ambulance]: Secondary | ICD-10-CM

## 2020-07-16 DIAGNOSIS — Z23 Encounter for immunization: Secondary | ICD-10-CM | POA: Diagnosis present

## 2020-07-16 DIAGNOSIS — K7290 Hepatic failure, unspecified without coma: Secondary | ICD-10-CM | POA: Diagnosis not present

## 2020-07-16 DIAGNOSIS — Z4659 Encounter for fitting and adjustment of other gastrointestinal appliance and device: Secondary | ICD-10-CM

## 2020-07-16 DIAGNOSIS — Z9289 Personal history of other medical treatment: Secondary | ICD-10-CM

## 2020-07-16 DIAGNOSIS — R188 Other ascites: Secondary | ICD-10-CM

## 2020-07-16 DIAGNOSIS — J9601 Acute respiratory failure with hypoxia: Secondary | ICD-10-CM | POA: Diagnosis present

## 2020-07-16 DIAGNOSIS — R17 Unspecified jaundice: Secondary | ICD-10-CM | POA: Diagnosis not present

## 2020-07-16 DIAGNOSIS — F1210 Cannabis abuse, uncomplicated: Secondary | ICD-10-CM | POA: Diagnosis present

## 2020-07-16 DIAGNOSIS — E861 Hypovolemia: Secondary | ICD-10-CM | POA: Diagnosis not present

## 2020-07-16 DIAGNOSIS — L8995 Pressure ulcer of unspecified site, unstageable: Secondary | ICD-10-CM | POA: Diagnosis not present

## 2020-07-16 DIAGNOSIS — T380X5A Adverse effect of glucocorticoids and synthetic analogues, initial encounter: Secondary | ICD-10-CM | POA: Diagnosis not present

## 2020-07-16 DIAGNOSIS — L8990 Pressure ulcer of unspecified site, unspecified stage: Secondary | ICD-10-CM | POA: Insufficient documentation

## 2020-07-16 DIAGNOSIS — K7011 Alcoholic hepatitis with ascites: Secondary | ICD-10-CM | POA: Diagnosis not present

## 2020-07-16 DIAGNOSIS — E669 Obesity, unspecified: Secondary | ICD-10-CM | POA: Diagnosis present

## 2020-07-16 LAB — CBC WITH DIFFERENTIAL/PLATELET
Abs Immature Granulocytes: 0.17 10*3/uL — ABNORMAL HIGH (ref 0.00–0.07)
Basophils Absolute: 0 10*3/uL (ref 0.0–0.1)
Basophils Relative: 0 %
Eosinophils Absolute: 0 10*3/uL (ref 0.0–0.5)
Eosinophils Relative: 0 %
HCT: 32.2 % — ABNORMAL LOW (ref 39.0–52.0)
Hemoglobin: 11.5 g/dL — ABNORMAL LOW (ref 13.0–17.0)
Immature Granulocytes: 1 %
Lymphocytes Relative: 9 %
Lymphs Abs: 1.3 10*3/uL (ref 0.7–4.0)
MCH: 31.4 pg (ref 26.0–34.0)
MCHC: 35.7 g/dL (ref 30.0–36.0)
MCV: 88 fL (ref 80.0–100.0)
Monocytes Absolute: 0.8 10*3/uL (ref 0.1–1.0)
Monocytes Relative: 6 %
Neutro Abs: 12 10*3/uL — ABNORMAL HIGH (ref 1.7–7.7)
Neutrophils Relative %: 84 %
Platelets: 156 10*3/uL (ref 150–400)
RBC: 3.66 MIL/uL — ABNORMAL LOW (ref 4.22–5.81)
RDW: 20.9 % — ABNORMAL HIGH (ref 11.5–15.5)
WBC: 14.2 10*3/uL — ABNORMAL HIGH (ref 4.0–10.5)
nRBC: 0 % (ref 0.0–0.2)

## 2020-07-16 LAB — COMPREHENSIVE METABOLIC PANEL
ALT: 68 U/L — ABNORMAL HIGH (ref 0–44)
AST: 207 U/L — ABNORMAL HIGH (ref 15–41)
Albumin: 1.8 g/dL — ABNORMAL LOW (ref 3.5–5.0)
Alkaline Phosphatase: 265 U/L — ABNORMAL HIGH (ref 38–126)
Anion gap: 25 — ABNORMAL HIGH (ref 5–15)
BUN: 26 mg/dL — ABNORMAL HIGH (ref 6–20)
CO2: 31 mmol/L (ref 22–32)
Calcium: 6.7 mg/dL — ABNORMAL LOW (ref 8.9–10.3)
Chloride: 80 mmol/L — ABNORMAL LOW (ref 98–111)
Creatinine, Ser: 1.68 mg/dL — ABNORMAL HIGH (ref 0.61–1.24)
GFR, Estimated: 50 mL/min — ABNORMAL LOW (ref >60)
Glucose, Bld: 138 mg/dL — ABNORMAL HIGH (ref 70–99)
Potassium: 2.6 mmol/L — CL (ref 3.5–5.1)
Sodium: 136 mmol/L (ref 135–145)
Total Bilirubin: 21.1 mg/dL (ref 0.3–1.2)
Total Protein: 6.3 g/dL — ABNORMAL LOW (ref 6.5–8.1)

## 2020-07-16 LAB — RAPID URINE DRUG SCREEN, HOSP PERFORMED
Amphetamines: NOT DETECTED
Barbiturates: NOT DETECTED
Benzodiazepines: POSITIVE — AB
Cocaine: NOT DETECTED
Opiates: NOT DETECTED
Tetrahydrocannabinol: POSITIVE — AB

## 2020-07-16 LAB — BASIC METABOLIC PANEL
Anion gap: 22 — ABNORMAL HIGH (ref 5–15)
BUN: 24 mg/dL — ABNORMAL HIGH (ref 6–20)
CO2: 29 mmol/L (ref 22–32)
Calcium: 6.3 mg/dL — CL (ref 8.9–10.3)
Chloride: 83 mmol/L — ABNORMAL LOW (ref 98–111)
Creatinine, Ser: 1.41 mg/dL — ABNORMAL HIGH (ref 0.61–1.24)
GFR, Estimated: >60 mL/min (ref >60)
Glucose, Bld: 135 mg/dL — ABNORMAL HIGH (ref 70–99)
Potassium: <2 mmol/L — CL (ref 3.5–5.1)
Sodium: 134 mmol/L — ABNORMAL LOW (ref 135–145)

## 2020-07-16 LAB — I-STAT CHEM 8, ED
BUN: 34 mg/dL — ABNORMAL HIGH (ref 6–20)
Calcium, Ion: 0.7 mmol/L — CL (ref 1.15–1.40)
Chloride: 79 mmol/L — ABNORMAL LOW (ref 98–111)
Creatinine, Ser: 1.2 mg/dL (ref 0.61–1.24)
Glucose, Bld: 131 mg/dL — ABNORMAL HIGH (ref 70–99)
HCT: 38 % — ABNORMAL LOW (ref 39.0–52.0)
Hemoglobin: 12.9 g/dL — ABNORMAL LOW (ref 13.0–17.0)
Potassium: 2.3 mmol/L — CL (ref 3.5–5.1)
Sodium: 134 mmol/L — ABNORMAL LOW (ref 135–145)
TCO2: 35 mmol/L — ABNORMAL HIGH (ref 22–32)

## 2020-07-16 LAB — MAGNESIUM: Magnesium: 1.3 mg/dL — ABNORMAL LOW (ref 1.7–2.4)

## 2020-07-16 LAB — URINALYSIS, ROUTINE W REFLEX MICROSCOPIC

## 2020-07-16 LAB — I-STAT ARTERIAL BLOOD GAS, ED
Acid-Base Excess: 16 mmol/L — ABNORMAL HIGH (ref 0.0–2.0)
Bicarbonate: 40.9 mmol/L — ABNORMAL HIGH (ref 20.0–28.0)
Calcium, Ion: 0.75 mmol/L — CL (ref 1.15–1.40)
HCT: 38 % — ABNORMAL LOW (ref 39.0–52.0)
Hemoglobin: 12.9 g/dL — ABNORMAL LOW (ref 13.0–17.0)
O2 Saturation: 100 %
Potassium: <2 mmol/L — CL (ref 3.5–5.1)
Sodium: 134 mmol/L — ABNORMAL LOW (ref 135–145)
TCO2: 42 mmol/L — ABNORMAL HIGH (ref 22–32)
pCO2 arterial: 47.5 mmHg (ref 32.0–48.0)
pH, Arterial: 7.542 — ABNORMAL HIGH (ref 7.350–7.450)
pO2, Arterial: 358 mmHg — ABNORMAL HIGH (ref 83.0–108.0)

## 2020-07-16 LAB — TYPE AND SCREEN
ABO/RH(D): O POS
Antibody Screen: NEGATIVE

## 2020-07-16 LAB — ACETAMINOPHEN LEVEL: Acetaminophen (Tylenol), Serum: <10 ug/mL — ABNORMAL LOW (ref 10–30)

## 2020-07-16 LAB — SALICYLATE LEVEL: Salicylate Lvl: 16.8 mg/dL (ref 7.0–30.0)

## 2020-07-16 LAB — URINALYSIS, MICROSCOPIC (REFLEX)

## 2020-07-16 LAB — T4, FREE: Free T4: 0.56 ng/dL — ABNORMAL LOW (ref 0.61–1.12)

## 2020-07-16 LAB — PROTIME-INR
INR: 1.6 — ABNORMAL HIGH (ref 0.8–1.2)
Prothrombin Time: 18.1 seconds — ABNORMAL HIGH (ref 11.4–15.2)

## 2020-07-16 LAB — POC OCCULT BLOOD, ED: Fecal Occult Bld: NEGATIVE

## 2020-07-16 LAB — GLUCOSE, CAPILLARY
Glucose-Capillary: 135 mg/dL — ABNORMAL HIGH (ref 70–99)
Glucose-Capillary: 158 mg/dL — ABNORMAL HIGH (ref 70–99)

## 2020-07-16 LAB — CORTISOL: Cortisol, Plasma: 33.4 ug/dL

## 2020-07-16 LAB — LACTIC ACID, PLASMA
Lactic Acid, Venous: 9.6 mmol/L (ref 0.5–1.9)
Lactic Acid, Venous: 7.7 mmol/L (ref 0.5–1.9)

## 2020-07-16 LAB — FIBRINOGEN: Fibrinogen: 383 mg/dL (ref 210–475)

## 2020-07-16 LAB — RESP PANEL BY RT-PCR (FLU A&B, COVID) ARPGX2
Influenza A by PCR: NEGATIVE
Influenza B by PCR: NEGATIVE
SARS Coronavirus 2 by RT PCR: NEGATIVE

## 2020-07-16 LAB — CBG MONITORING, ED: Glucose-Capillary: 132 mg/dL — ABNORMAL HIGH (ref 70–99)

## 2020-07-16 LAB — CK: Total CK: 183 U/L (ref 49–397)

## 2020-07-16 LAB — ETHANOL: Alcohol, Ethyl (B): <10 mg/dL (ref <10)

## 2020-07-16 LAB — APTT: aPTT: 47 seconds — ABNORMAL HIGH (ref 24–36)

## 2020-07-16 LAB — TSH: TSH: 1.057 u[IU]/mL (ref 0.350–4.500)

## 2020-07-16 LAB — AMMONIA: Ammonia: 207 umol/L — ABNORMAL HIGH (ref 9–35)

## 2020-07-16 LAB — PROCALCITONIN: Procalcitonin: 0.75 ng/mL

## 2020-07-16 MED ORDER — ENOXAPARIN SODIUM 40 MG/0.4ML ~~LOC~~ SOLN: 40.0000 mg | SUBCUTANEOUS | Status: DC

## 2020-07-16 MED ORDER — PHENYLEPHRINE 40 MCG/ML (10ML) SYRINGE FOR IV PUSH (FOR BLOOD PRESSURE SUPPORT)
PREFILLED_SYRINGE | INTRAVENOUS | Status: AC
  Filled 2020-07-16: qty 10

## 2020-07-16 MED ORDER — LACTATED RINGERS IV BOLUS (SEPSIS)
1000.0000 mL | Freq: Once | INTRAVENOUS | Status: AC
  Administered 2020-07-16: 1000 mL via INTRAVENOUS

## 2020-07-16 MED ORDER — POTASSIUM CHLORIDE 10 MEQ/100ML IV SOLN
10.0000 meq | INTRAVENOUS | Status: AC
  Administered 2020-07-16 – 2020-07-17 (×6): 10 meq via INTRAVENOUS
  Filled 2020-07-16 (×7): qty 100

## 2020-07-16 MED ORDER — LACTULOSE 10 GM/15ML PO SOLN
30.0000 g | Freq: Once | ORAL | Status: AC
  Administered 2020-07-16: 30 g

## 2020-07-16 MED ORDER — MIDAZOLAM HCL 2 MG/2ML IJ SOLN
INTRAMUSCULAR | Status: AC
  Filled 2020-07-16: qty 4

## 2020-07-16 MED ORDER — POTASSIUM CHLORIDE 10 MEQ/100ML IV SOLN
10.0000 meq | INTRAVENOUS | Status: AC
  Administered 2020-07-16 (×3): 10 meq via INTRAVENOUS
  Filled 2020-07-16 (×2): qty 100

## 2020-07-16 MED ORDER — ETOMIDATE 2 MG/ML IV SOLN
INTRAVENOUS | Status: AC
  Filled 2020-07-16: qty 20

## 2020-07-16 MED ORDER — PROPOFOL 1000 MG/100ML IV EMUL
5.0000 ug/kg/min | INTRAVENOUS | Status: DC
  Administered 2020-07-16: 5 ug/kg/min via INTRAVENOUS

## 2020-07-16 MED ORDER — THIAMINE HCL 100 MG/ML IJ SOLN
100.0000 mg | Freq: Every day | INTRAMUSCULAR | Status: DC
  Administered 2020-07-17 – 2020-07-20 (×4): 100 mg via INTRAVENOUS
  Filled 2020-07-16 (×4): qty 2

## 2020-07-16 MED ORDER — ROCURONIUM BROMIDE 10 MG/ML (PF) SYRINGE
PREFILLED_SYRINGE | INTRAVENOUS | Status: AC
  Filled 2020-07-16: qty 10

## 2020-07-16 MED ORDER — FENTANYL CITRATE (PF) 100 MCG/2ML IJ SOLN: 50.0000 ug | INTRAMUSCULAR | Status: DC | PRN

## 2020-07-16 MED ORDER — SODIUM CHLORIDE 0.9 % IV SOLN: 2000.0000 mg | Freq: Two times a day (BID) | INTRAVENOUS | Status: DC

## 2020-07-16 MED ORDER — POTASSIUM CHLORIDE 20 MEQ/15ML (10%) PO SOLN
40.0000 meq | Freq: Once | ORAL | Status: AC
  Administered 2020-07-17: 40 meq
  Filled 2020-07-16: qty 30

## 2020-07-16 MED ORDER — VANCOMYCIN HCL IN DEXTROSE 1-5 GM/200ML-% IV SOLN
1000.0000 mg | Freq: Two times a day (BID) | INTRAVENOUS | Status: DC
  Administered 2020-07-17 – 2020-07-18 (×3): 1000 mg via INTRAVENOUS
  Filled 2020-07-16 (×3): qty 200

## 2020-07-16 MED ORDER — LEVETIRACETAM IN NACL 1000 MG/100ML IV SOLN
2000.0000 mg | Freq: Once | INTRAVENOUS | Status: AC
  Administered 2020-07-16: 2000 mg via INTRAVENOUS
  Filled 2020-07-16: qty 200

## 2020-07-16 MED ORDER — POTASSIUM CHLORIDE 20 MEQ PO PACK
40.0000 meq | PACK | ORAL | Status: AC
  Administered 2020-07-16: 40 meq
  Filled 2020-07-16: qty 2

## 2020-07-16 MED ORDER — METRONIDAZOLE IN NACL 5-0.79 MG/ML-% IV SOLN
500.0000 mg | Freq: Once | INTRAVENOUS | Status: AC
  Administered 2020-07-16: 500 mg via INTRAVENOUS
  Filled 2020-07-16: qty 100

## 2020-07-16 MED ORDER — MIDAZOLAM 50MG/50ML (1MG/ML) PREMIX INFUSION: 0.5000 mg/h | INTRAVENOUS | Status: DC

## 2020-07-16 MED ORDER — ROCURONIUM BROMIDE 50 MG/5ML IV SOLN
INTRAVENOUS | Status: AC | PRN
  Administered 2020-07-16: 100 mg via INTRAVENOUS

## 2020-07-16 MED ORDER — SODIUM CHLORIDE 0.9% FLUSH
3.0000 mL | Freq: Two times a day (BID) | INTRAVENOUS | Status: DC
  Administered 2020-07-17 – 2020-07-25 (×16): 3 mL via INTRAVENOUS

## 2020-07-16 MED ORDER — ETOMIDATE 2 MG/ML IV SOLN
INTRAVENOUS | Status: AC | PRN
  Administered 2020-07-16: 10 mg via INTRAVENOUS

## 2020-07-16 MED ORDER — FENTANYL CITRATE (PF) 100 MCG/2ML IJ SOLN
50.0000 ug | INTRAMUSCULAR | Status: DC | PRN
  Administered 2020-07-16 – 2020-07-17 (×2): 100 ug via INTRAVENOUS
  Administered 2020-07-19: 200 ug via INTRAVENOUS
  Filled 2020-07-16 (×2): qty 2
  Filled 2020-07-16 (×2): qty 4

## 2020-07-16 MED ORDER — NOREPINEPHRINE 4 MG/250ML-% IV SOLN
2.0000 ug/min | INTRAVENOUS | Status: DC
  Administered 2020-07-16: 3 ug/min via INTRAVENOUS
  Filled 2020-07-16: qty 250

## 2020-07-16 MED ORDER — PROPOFOL 1000 MG/100ML IV EMUL
5.0000 ug/kg/min | INTRAVENOUS | Status: DC
  Administered 2020-07-17: 10 ug/kg/min via INTRAVENOUS
  Administered 2020-07-17 – 2020-07-18 (×2): 20 ug/kg/min via INTRAVENOUS
  Administered 2020-07-18: 25 ug/kg/min via INTRAVENOUS
  Filled 2020-07-16 (×5): qty 100

## 2020-07-16 MED ORDER — PANTOPRAZOLE SODIUM 40 MG IV SOLR
40.0000 mg | Freq: Every day | INTRAVENOUS | Status: DC
  Administered 2020-07-16 – 2020-07-19 (×4): 40 mg via INTRAVENOUS
  Filled 2020-07-16 (×4): qty 40

## 2020-07-16 MED ORDER — VANCOMYCIN HCL 2000 MG/400ML IV SOLN
2000.0000 mg | Freq: Once | INTRAVENOUS | Status: DC
  Filled 2020-07-16: qty 400

## 2020-07-16 MED ORDER — METHYLPREDNISOLONE SODIUM SUCC 40 MG IJ SOLR
32.0000 mg | Freq: Every day | INTRAMUSCULAR | Status: DC
  Administered 2020-07-16 – 2020-07-26 (×11): 32 mg via INTRAVENOUS
  Filled 2020-07-16 (×11): qty 1

## 2020-07-16 MED ORDER — LACTATED RINGERS IV SOLN: INTRAVENOUS | Status: DC

## 2020-07-16 MED ORDER — FOLIC ACID 1 MG PO TABS
1.0000 mg | ORAL_TABLET | Freq: Every day | ORAL | Status: DC
  Administered 2020-07-16 – 2020-07-24 (×9): 1 mg
  Filled 2020-07-16 (×9): qty 1

## 2020-07-16 MED ORDER — MAGNESIUM SULFATE 2 GM/50ML IV SOLN
2.0000 g | Freq: Once | INTRAVENOUS | Status: AC
  Administered 2020-07-16: 2 g via INTRAVENOUS
  Filled 2020-07-16: qty 50

## 2020-07-16 MED ORDER — DOCUSATE SODIUM 50 MG/5ML PO LIQD
100.0000 mg | Freq: Two times a day (BID) | ORAL | Status: DC
  Administered 2020-07-16 – 2020-07-22 (×6): 100 mg
  Filled 2020-07-16 (×8): qty 10

## 2020-07-16 MED ORDER — FENTANYL CITRATE (PF) 100 MCG/2ML IJ SOLN
INTRAMUSCULAR | Status: AC
  Administered 2020-07-16: 50 ug
  Filled 2020-07-16: qty 2

## 2020-07-16 MED ORDER — ALBUMIN HUMAN 5 % IV SOLN
25.0000 g | Freq: Once | INTRAVENOUS | Status: AC
  Administered 2020-07-16: 25 g via INTRAVENOUS
  Filled 2020-07-16 (×2): qty 500

## 2020-07-16 MED ORDER — CALCIUM GLUCONATE-NACL 1-0.675 GM/50ML-% IV SOLN
1.0000 g | Freq: Once | INTRAVENOUS | Status: AC
  Administered 2020-07-16: 1000 mg via INTRAVENOUS
  Filled 2020-07-16: qty 50

## 2020-07-16 MED ORDER — SODIUM CHLORIDE 0.9 % IV SOLN
2.0000 g | Freq: Once | INTRAVENOUS | Status: AC
  Administered 2020-07-16: 2 g via INTRAVENOUS
  Filled 2020-07-16: qty 2

## 2020-07-16 MED ORDER — SODIUM CHLORIDE 0.9 % IV SOLN
2.0000 g | Freq: Three times a day (TID) | INTRAVENOUS | Status: DC
  Administered 2020-07-17 – 2020-07-20 (×11): 2 g via INTRAVENOUS
  Filled 2020-07-16 (×11): qty 2

## 2020-07-16 MED ORDER — HEPARIN SODIUM (PORCINE) 5000 UNIT/ML IJ SOLN
5000.0000 [IU] | Freq: Three times a day (TID) | INTRAMUSCULAR | Status: DC
  Administered 2020-07-16 – 2020-08-06 (×59): 5000 [IU] via SUBCUTANEOUS
  Filled 2020-07-16 (×61): qty 1

## 2020-07-16 MED ORDER — VANCOMYCIN HCL IN DEXTROSE 1-5 GM/200ML-% IV SOLN: 1000.0000 mg | Freq: Once | INTRAVENOUS | Status: DC

## 2020-07-16 MED ORDER — POLYETHYLENE GLYCOL 3350 17 G PO PACK
17.0000 g | PACK | Freq: Every day | ORAL | Status: DC
  Administered 2020-07-16 – 2020-07-18 (×2): 17 g
  Filled 2020-07-16 (×2): qty 1

## 2020-07-16 MED ORDER — FENTANYL CITRATE (PF) 100 MCG/2ML IJ SOLN
INTRAMUSCULAR | Status: AC
  Filled 2020-07-16: qty 2

## 2020-07-16 MED ORDER — SODIUM CHLORIDE 0.9 % IV SOLN
250.0000 mL | INTRAVENOUS | Status: DC
  Administered 2020-07-17: 250 mL via INTRAVENOUS

## 2020-07-16 MED ORDER — LACTULOSE 10 GM/15ML PO SOLN
30.0000 g | Freq: Three times a day (TID) | ORAL | Status: DC
  Administered 2020-07-16 – 2020-07-23 (×19): 30 g
  Filled 2020-07-16 (×21): qty 45

## 2020-07-16 MED ORDER — POTASSIUM CHLORIDE 10 MEQ/100ML IV SOLN
10.0000 meq | INTRAVENOUS | Status: AC
  Administered 2020-07-16: 10 meq via INTRAVENOUS
  Filled 2020-07-16: qty 100

## 2020-07-16 NOTE — Progress Notes (Signed)
Pharmacy Antibiotic Note  [name] is a 49 y.o. male admitted on 07/16/2020 with sepsis.  Pharmacy has been consulted for cefepime and vancomycin dosing. Scr 1.68, up from baseline ~0.6-0.8.   Plan: Cefepime 2g IV q8h Vancomycin 2000 mg IV x1 followed by 1000 mg IV q12h Trough goal 15-20 mcg/mL Monitor renal function, cultures, clinical progress  Height: 6' (182.9 cm) Weight: 98.4 kg (216 lb 14.9 oz) IBW/kg (Calculated) : 77.6  No data recorded.  Recent Labs  Lab 07/16/20 1536 07/16/20 1541  WBC  --  14.2*  CREATININE 1.20 1.68*  LATICACIDVEN  --  9.6*    Estimated Creatinine Clearance: 66 mL/min (A) (by C-G formula based on SCr of 1.68 mg/dL (H)).    Allergies  Allergen Reactions  . Wellbutrin [Bupropion] Other (See Comments)    Caused seizures    Antimicrobials this admission: Cefepime 11/29 >>  Vancomycin 11/29 >>  Flagyl 11/29 >>  Dose adjustments this admission:   Microbiology results: 11/29 BCx: sent   Thank you for allowing pharmacy to be a part of this patient's care.  Rebbeca Paul, PharmD PGY1 Pharmacy Resident 07/16/2020 5:28 PM  Please check [email or web address] for unit-specific pharmacy phone numbers.

## 2020-07-16 NOTE — ED Provider Notes (Signed)
[facility] EMERGENCY DEPARTMENT Provider Note   CSN: [id number] Arrival date & time: 07/16/20  1458     History Chief Complaint  Patient presents with  . Seizures    [name] is a 49 y.o. male.  HPI      49yo male with history of cirrhosis, hyperbilirubinemia, schizoaffective disorder, PTSD, etoh dependence and withdrawal wit history of seizures who presents after being found unresponsive surrounded by four lokos on wellness check after he told family he was going to stop drinking.   Sister saw him 5-6 days ago, was more jaundiced, LLE swollen, seemed slower than normal Went to get his medication but 2 of them were not available (seroquel and sertraline) Has history of seizures from alcohol withdrawal  Mom had talked with him 1.5 days ago, had said he was going to stop drinking Yesterday AM was last known Has had thoughts of hurting self in the past but didn't say anything to family about it this time  Was found down on the floor surrounded by four lokos     Past Medical History:  Diagnosis Date  . Alcohol abuse   . Anxiety    at age 79  . Depression    at age 59  . GERD (gastroesophageal reflux disease)   . Hypertension   . Psychosis ([place])   . PTSD (post-traumatic stress disorder)   . Schizoaffective disorder   . Seizure disorder ([place])    related to etoh seizure  . Symptomatic anemia     Patient Active Problem List   Diagnosis Date Noted  . Hepatic encephalopathy ([place]) 07/16/2020  . Alcoholic cirrhosis of liver with ascites ([place])   . Hypomagnesemia 05/03/2020  . Hyperbilirubinemia 05/03/2020  . D-dimer, elevated 05/03/2020  . Alcoholism ([place]) 05/03/2020  . Rhabdomyolysis 03/06/2019  . Alcoholic hepatitis without ascites 03/06/2019  . Alcohol withdrawal ([place]) 01/15/2019  . HTN (hypertension) 01/10/2019  . Fall 01/10/2019  . Alcohol intoxication ([place]) 01/10/2019  . Schizoaffective disorder, bipolar type ([place]) 06/02/2018  .  Alcohol dependence with uncomplicated withdrawal ([place]) 02/22/2016  . Alcohol-induced mood disorder ([place]) 02/22/2016  . Gout 10/22/2015  . GERD (gastroesophageal reflux disease) 10/22/2015  . Alcohol withdrawal seizure with complication ([place]) 26/41/5830  . Schizoaffective disorder, depressive type ([place])   . History of alcohol abuse   . Tobacco dependence due to cigarettes 11/20/2014  . Alcohol abuse with intoxication ([place])   . Hyponatremia 10/18/2014  . Abnormal LFTs   . Alcohol abuse 02/09/2014  . Post traumatic stress disorder (PTSD) 11/03/2011  . Hypokalemia 07/02/2011    Past Surgical History:  Procedure Laterality Date  . ESOPHAGOGASTRODUODENOSCOPY (EGD) WITH PROPOFOL N/A 05/05/2020   Procedure: ESOPHAGOGASTRODUODENOSCOPY (EGD) WITH PROPOFOL;  Surgeon: [name], MD;  Location: [facility] END[facility];  Service: Endoscopy;  Laterality: N/A;  . FOOT SURGERY     right  . HAND SURGERY         Family History  Problem Relation Age of Onset  . Alcoholism Mother   . CAD Father   . Depression Brother     Social History   Tobacco Use  . Smoking status: Current Every Day Smoker    Packs/day: 1.50    Years: 30.00    Pack years: 45.00    Types: Cigarettes  . Smokeless tobacco: Never Used  Vaping Use  . Vaping Use: Every day  Substance Use Topics  . Alcohol use: Yes    Alcohol/week: 0.0 standard drinks    Comment: drinking daily   .  Drug use: Yes    Frequency: 1.0 times per week    Types: Marijuana    Comment: THC 2 to 3 times per month. last use 09/24/2015    Home Medications Prior to Admission medications   Medication Sig Start Date End Date Taking? Authorizing Provider  allopurinol (ZYLOPRIM) 100 MG tablet Take 2 tablets (200 mg total) by mouth daily. 04/20/20   Nicolette Bang, DO  loperamide (IMODIUM) 2 MG capsule Take 4 mg by mouth as needed for diarrhea or loose stools.    [provider]  metoprolol tartrate (LOPRESSOR) 25 MG tablet Take 0.5  tablets (12.5 mg total) by mouth 2 (two) times daily. 07/11/20 08/10/20  Nicolette Bang, DO  MILK THISTLE PO Take 1 capsule by mouth daily.    [provider]  Misc. Devices MISC Blood Pressure monitor. Dx :Hypertension 06/09/19   [name], MD  Multiple Vitamin (MULTIVITAMIN WITH MINERALS) TABS tablet Take 1 tablet by mouth daily. 05/08/20   [place] Eric J, DO  pantoprazole (PROTONIX) 40 MG tablet Take 1 tablet (40 mg total) by mouth daily. 07/11/20 08/10/20  Nicolette Bang, DO  QUEtiapine (SEROQUEL) 50 MG tablet Take 1 tablet (50 mg total) by mouth at bedtime. 06/06/20 07/06/20  Nicolette Bang, DO  sertraline (ZOLOFT) 25 MG tablet Take 1 tablet (25 mg total) by mouth daily. 07/11/20   Nicolette Bang, DO    Allergies    Wellbutrin [bupropion]  Review of Systems   Review of Systems  Unable to perform ROS: Mental status change    Physical Exam Updated Vital Signs BP (!) 93/50   Pulse 62   Temp (!) 90 F (32.2 C)   Resp 15   Ht 6' (1.829 m)   Wt 98.4 kg   SpO2 93%   BMI 29.42 kg/m   Physical Exam Vitals and nursing note reviewed.  Constitutional:      General: He is sleeping. He is not in acute distress.    Appearance: He is well-developed. He is ill-appearing and toxic-appearing. He is not diaphoretic.  HENT:     Head: Normocephalic.  Eyes:     Conjunctiva/sclera: Conjunctivae normal.     Comments: Scleral icterus PERRL  Cardiovascular:     Rate and Rhythm: Normal rate and regular rhythm.     Heart sounds: Normal heart sounds. No murmur heard.  No friction rub. No gallop.   Pulmonary:     Effort: Pulmonary effort is normal. No respiratory distress.     Breath sounds: Normal breath sounds. No wheezing or rales.  Abdominal:     General: There is distension.     Palpations: Abdomen is soft.     Tenderness: There is no abdominal tenderness. There is no guarding.  Musculoskeletal:     Right lower leg: Edema  present.     Left lower leg: Edema present.  Skin:    General: Skin is warm and dry.     Coloration: Skin is jaundiced.  Neurological:     Mental Status: He is lethargic.     GCS: GCS eye subscore is 1. GCS verbal subscore is 1. GCS motor subscore is 5.     Comments: Appears to localize to pain, moves all 4 extremities     ED Results / Procedures / Treatments   Labs (all labs ordered are listed, but only abnormal results are displayed) Labs Reviewed  LACTIC ACID, PLASMA - Abnormal; Notable for the following components:  Result Value   Lactic Acid, Venous 9.6 (*)    All other components within normal limits  LACTIC ACID, PLASMA - Abnormal; Notable for the following components:   Lactic Acid, Venous 7.7 (*)    All other components within normal limits  COMPREHENSIVE METABOLIC PANEL - Abnormal; Notable for the following components:   Potassium 2.6 (*)    Chloride 80 (*)    Glucose, Bld 138 (*)    BUN 26 (*)    Creatinine, Ser 1.68 (*)    Calcium 6.7 (*)    Total Protein 6.3 (*)    Albumin 1.8 (*)    AST 207 (*)    ALT 68 (*)    Alkaline Phosphatase 265 (*)    Total Bilirubin 21.1 (*)    GFR, Estimated 50 (*)    Anion gap 25 (*)    All other components within normal limits  CBC WITH DIFFERENTIAL/PLATELET - Abnormal; Notable for the following components:   WBC 14.2 (*)    RBC 3.66 (*)    Hemoglobin 11.5 (*)    HCT 32.2 (*)    RDW 20.9 (*)    Neutro Abs 12.0 (*)    Abs Immature Granulocytes 0.17 (*)    All other components within normal limits  PROTIME-INR - Abnormal; Notable for the following components:   Prothrombin Time 18.1 (*)    INR 1.6 (*)    All other components within normal limits  APTT - Abnormal; Notable for the following components:   aPTT 47 (*)    All other components within normal limits  URINALYSIS, ROUTINE W REFLEX MICROSCOPIC - Abnormal; Notable for the following components:   Color, Urine BROWN (*)    APPearance HAZY (*)    Glucose, UA    (*)    Value: TEST NOT REPORTED DUE TO COLOR INTERFERENCE OF URINE PIGMENT   Hgb urine dipstick   (*)    Value: TEST NOT REPORTED DUE TO COLOR INTERFERENCE OF URINE PIGMENT   Bilirubin Urine   (*)    Value: TEST NOT REPORTED DUE TO COLOR INTERFERENCE OF URINE PIGMENT   Ketones, ur   (*)    Value: TEST NOT REPORTED DUE TO COLOR INTERFERENCE OF URINE PIGMENT   Protein, ur   (*)    Value: TEST NOT REPORTED DUE TO COLOR INTERFERENCE OF URINE PIGMENT   Nitrite   (*)    Value: TEST NOT REPORTED DUE TO COLOR INTERFERENCE OF URINE PIGMENT   Leukocytes,Ua   (*)    Value: TEST NOT REPORTED DUE TO COLOR INTERFERENCE OF URINE PIGMENT   All other components within normal limits  AMMONIA - Abnormal; Notable for the following components:   Ammonia 207 (*)    All other components within normal limits  MAGNESIUM - Abnormal; Notable for the following components:   Magnesium 1.3 (*)    All other components within normal limits  T4, FREE - Abnormal; Notable for the following components:   Free T4 0.56 (*)    All other components within normal limits  ACETAMINOPHEN LEVEL - Abnormal; Notable for the following components:   Acetaminophen (Tylenol), Serum <10 (*)    All other components within normal limits  RAPID URINE DRUG SCREEN, HOSP PERFORMED - Abnormal; Notable for the following components:   Benzodiazepines POSITIVE (*)    Tetrahydrocannabinol POSITIVE (*)    All other components within normal limits  BASIC METABOLIC PANEL - Abnormal; Notable for the following components:   Sodium 134 (*)  Potassium <2.0 (*)    Chloride 83 (*)    Glucose, Bld 135 (*)    BUN 24 (*)    Creatinine, Ser 1.41 (*)    Calcium 6.3 (*)    Anion gap 22 (*)    All other components within normal limits  URINALYSIS, MICROSCOPIC (REFLEX) - Abnormal; Notable for the following components:   Bacteria, UA FEW (*)    All other components within normal limits  GLUCOSE, CAPILLARY - Abnormal; Notable for the following  components:   Glucose-Capillary 135 (*)    All other components within normal limits  CBG MONITORING, ED - Abnormal; Notable for the following components:   Glucose-Capillary 132 (*)    All other components within normal limits  I-STAT CHEM 8, ED - Abnormal; Notable for the following components:   Sodium 134 (*)    Potassium 2.3 (*)    Chloride 79 (*)    BUN 34 (*)    Glucose, Bld 131 (*)    Calcium, Ion 0.70 (*)    TCO2 35 (*)    Hemoglobin 12.9 (*)    HCT 38.0 (*)    All other components within normal limits  I-STAT ARTERIAL BLOOD GAS, ED - Abnormal; Notable for the following components:   pH, Arterial 7.542 (*)    pO2, Arterial 358 (*)    Bicarbonate 40.9 (*)    TCO2 42 (*)    Acid-Base Excess 16.0 (*)    Sodium 134 (*)    Potassium <2.0 (*)    Calcium, Ion 0.75 (*)    HCT 38.0 (*)    Hemoglobin 12.9 (*)    All other components within normal limits  RESP PANEL BY RT-PCR (FLU A&B, COVID) ARPGX2  CULTURE, BLOOD (ROUTINE X 2)  CULTURE, BLOOD (ROUTINE X 2)  URINE CULTURE  TSH  ETHANOL  SALICYLATE LEVEL  CK  CORTISOL  PROCALCITONIN  FIBRINOGEN  BLOOD GAS, ARTERIAL  MAGNESIUM  PHOSPHORUS  COMPREHENSIVE METABOLIC PANEL  APTT  PROTIME-INR  CBC  PROCALCITONIN  I-STAT CHEM 8, ED  POC OCCULT BLOOD, ED  TYPE AND SCREEN    EKG EKG Interpretation  Date/Time:  Monday July 16 2020 15:49:16 EST Ventricular Rate:  84 PR Interval:    QRS Duration: 109 QT Interval:  529 QTC Calculation: 626 R Axis:   77 Text Interpretation: Sinus rhythm Borderline repol abnormality, diffuse leads Prolonged QT interval No significant change since last tracing Confirmed by Gareth Morgan [phone]) on 07/16/2020 11:09:16 PM   Radiology CT Head Wo Contrast  Result Date: 07/16/2020 CLINICAL DATA:  Delirium EXAM: CT HEAD WITHOUT CONTRAST TECHNIQUE: Contiguous axial images were obtained from the base of the skull through the vertex without intravenous contrast. COMPARISON:  CT head  and cervical spine 05/03/2020 FINDINGS: Brain: No evidence of acute infarction, hemorrhage, hydrocephalus, extra-axial collection, visible mass lesion or mass effect. Vascular: No hyperdense vessel or unexpected calcification. Skull: No calvarial fracture or suspicious osseous lesion. No scalp swelling or hematoma. Sinuses/Orbits: Nodular mural thickening in the paranasal sinuses most pronounced in the maxillary sinus with some hyperostotic changes suggesting chronicity. No layering air-fluid levels or pneumatized secretions. Included orbital structures are unremarkable. Other: Endotracheal and transesophageal tubes are noted at the time of exam. IMPRESSION: 1. No acute intracranial findings. 2. Patient intubated at the time exam. 3. Chronic paranasal sinus disease. Electronically Signed   By: Lovena Le M.D.   On: 07/16/2020 17:05   DG Chest Portable 1 View  Result Date: 07/16/2020 CLINICAL DATA:  Post intubation. EXAM: PORTABLE CHEST 1 VIEW COMPARISON:  July 16, 2020 FINDINGS: Endotracheal tube now approximately 2.7 cm from the carina. Heart size top normal accentuated by portable technique and low lung volumes. Linear opacities at the LEFT lung base. Vascular crowding. No lobar consolidation or sign of pleural effusion. On limited assessment no acute skeletal process. IMPRESSION: 1. Endotracheal tube now approximately 2.7 cm from the carina, retracted approximately 1 cm since the prior study. 2. Low lung volumes with basilar atelectasis. 3. No signs of consolidation or effusion. Electronically Signed   By: Zetta Bills M.D.   On: 07/16/2020 16:20   DG Chest Port 1 View  Result Date: 07/16/2020 CLINICAL DATA:  Hypoxia EXAM: PORTABLE CHEST 1 VIEW COMPARISON:  May 04, 2020. FINDINGS: Endotracheal tube tip is 1.6 cm above the carina. No pneumothorax. There is mild left base atelectasis. Lungs elsewhere are clear. Heart size and pulmonary vascularity are normal. No adenopathy. No bone lesions.  IMPRESSION: Endotracheal tube as described without pneumothorax. Mild left base atelectasis. Lungs elsewhere clear. Cardiac silhouette normal. Electronically Signed   By: Lowella Grip III M.D.   On: 07/16/2020 16:11   DG Abd Portable 1V  Result Date: 07/16/2020 CLINICAL DATA:  Orogastric tube placement EXAM: PORTABLE ABDOMEN - 1 VIEW COMPARISON:  None. FINDINGS: Orogastric tube tip and side port are in the stomach. There is no bowel dilatation or air-fluid level to suggest bowel obstruction. No free air. Lung bases clear. IMPRESSION: Orogastric tube tip and side port in stomach. No bowel obstruction or free air evident. Lung bases clear. Electronically Signed   By: Lowella Grip III M.D.   On: 07/16/2020 16:35   US Abdomen Limited RUQ (LIVER/GB)  Result Date: 07/16/2020 CLINICAL DATA:  Spontaneous bacterial peritonitis EXAM: ULTRASOUND ABDOMEN LIMITED RIGHT UPPER QUADRANT COMPARISON:  Radiograph 07/16/2020, ultrasound 05/03/2020 FINDINGS: Gallbladder: Mild gallbladder wall thickening is nonspecific in the setting of intrinsic liver disease. No visible calcified gallstones though biliary sludge is present. Trace pericholecystic fluid may be redistributed from small volume ascites. Unable to assess the sonographic Murphy sign in this intubated patient. Common bile duct: Diameter: 4.5 mm, nondistended though poorly visualized. Liver: Diffusely increased hepatic echogenicity with coarsened hepatic echotexture and a nodular liver surface contour. No focal liver lesion. Recanalization of the umbilical vein is noted. No intrahepatic biliary ductal dilatation. The portal vein is difficult to fully visualize given diminished through transmission of the liver. Some hepatopetal flow is noted on the color Doppler assessment though waveforms appear diminished particularly within diastole. Other: Small volume ascites. IMPRESSION: 1. Echogenic and nodular liver compatible with stigmata cirrhosis and likely fatty  infiltration with features suggesting portal venous hypertension including small volume ascites, recanalization of the umbilical vein, and diminished portable velocities though difficult to fully assess given diminished through transmission. 2. Gallbladder sludge without visible cholelithiasis. Mild gallbladder wall thickening is nonspecific in the setting of intrinsic liver disease and trace pericholecystic fluid may be redistributed from small volume ascites though an acute cholecystitis is not fully excluded. If there is persisting concern, HIDA could be obtained. Electronically Signed   By: Lovena Le M.D.   On: 07/16/2020 23:07    Procedures .Critical Care Performed by: Gareth Morgan, MD Authorized by: Gareth Morgan, MD   Critical care provider statement:    Critical care time (minutes):  75   Critical care was necessary to treat or prevent imminent or life-threatening deterioration of the following conditions:  CNS failure or compromise, metabolic crisis and hepatic failure  Critical care was time spent personally by me on the following activities:  Discussions with consultants, evaluation of patient's response to treatment, examination of patient, ordering and performing treatments and interventions, ordering and review of laboratory studies, ordering and review of radiographic studies, pulse oximetry, re-evaluation of patient's condition, obtaining history from patient or surrogate and review of old charts Procedure Name: Intubation Date/Time: 07/16/2020 11:16 PM Performed by: Gareth Morgan, MD Pre-anesthesia Checklist: Patient identified, Patient being monitored, Emergency Drugs available, Timeout performed and Suction available Oxygen Delivery Method: Non-rebreather mask Preoxygenation: Pre-oxygenation with 100% oxygen Induction Type: Rapid sequence Ventilation: Mask ventilation without difficulty Placement Confirmation: ETT inserted through vocal cords under direct vision,   CO2 detector and Breath sounds checked- equal and bilateral      (including critical care time)  Medications Ordered in ED Medications  lactated ringers infusion ( Intravenous New Bag/Given 07/16/20 2144)  potassium chloride 10 mEq in 100 mL IVPB ( Intravenous Stopped 07/16/20 1800)  vancomycin (VANCOREADY) IVPB 2000 mg/400 mL (has no administration in time range)  propofol (DIPRIVAN) 1000 MG/100ML infusion (5 mcg/kg/min  98.4 kg Intravenous Transfusing/Transfer 07/16/20 2103)  thiamine (B-1) injection 100 mg (has no administration in time range)  ceFEPIme (MAXIPIME) 2 g in sodium chloride 0.9 % 100 mL IVPB (has no administration in time range)  vancomycin (VANCOCIN) IVPB 1000 mg/200 mL premix (has no administration in time range)  sodium chloride flush (NS) 0.9 % injection 3 mL (has no administration in time range)  folic acid (FOLVITE) tablet 1 mg (1 mg Per Tube Given 07/16/20 2000)  pantoprazole (PROTONIX) injection 40 mg (40 mg Intravenous Given 07/16/20 2000)  docusate (COLACE) 50 MG/5ML liquid 100 mg (100 mg Per Tube Given 07/16/20 2100)  polyethylene glycol (MIRALAX / GLYCOLAX) packet 17 g (17 g Per Tube Given 07/16/20 2000)  fentaNYL (SUBLIMAZE) injection 50 mcg (has no administration in time range)  fentaNYL (SUBLIMAZE) injection 50-200 mcg (100 mcg Intravenous Given 07/16/20 2230)  potassium chloride 10 mEq in 100 mL IVPB (10 mEq Intravenous New Bag/Given 07/16/20 2251)  potassium chloride (KLOR-CON) packet 40 mEq (has no administration in time range)  magnesium sulfate IVPB 2 g 50 mL (2 g Intravenous New Bag/Given 07/16/20 2252)  0.9 %  sodium chloride infusion (has no administration in time range)  norepinephrine (LEVOPHED) 4mg  in 224mL premix infusion (4 mcg/min Intravenous Rate/Dose Verify 07/16/20 2101)  lactulose (CHRONULAC) 10 GM/15ML solution 30 g (has no administration in time range)  methylPREDNISolone sodium succinate (SOLU-MEDROL) 40 mg/mL injection 32 mg (32 mg  Intravenous Given 07/16/20 2000)  heparin injection 5,000 Units (5,000 Units Subcutaneous Given 07/16/20 2100)  midazolam (VERSED) 50 mg/50 mL (1 mg/mL) premix infusion (0 mg/hr Intravenous Hold 07/16/20 2104)  potassium chloride 10 mEq in 100 mL IVPB (has no administration in time range)  potassium chloride 20 MEQ/15ML (10%) solution 40 mEq (has no administration in time range)  etomidate (AMIDATE) injection (10 mg Intravenous Given 07/16/20 1528)  rocuronium (ZEMURON) injection (100 mg Intravenous Given 07/16/20 1543)  lactated ringers bolus 1,000 mL (1,000 mLs Intravenous New Bag/Given 07/16/20 1931)    And  lactated ringers bolus 1,000 mL (0 mLs Intravenous Stopped 07/16/20 1758)    And  lactated ringers bolus 1,000 mL (0 mLs Intravenous Stopped 07/16/20 1928)  ceFEPIme (MAXIPIME) 2 g in sodium chloride 0.9 % 100 mL IVPB (0 g Intravenous Stopped 07/16/20 1655)  metroNIDAZOLE (FLAGYL) IVPB 500 mg ( Intravenous Stopped 07/16/20 1922)  fentaNYL (SUBLIMAZE) 100 MCG/2ML injection (50 mcg  Given 07/16/20 1553)  calcium gluconate 1 g/ 50 mL sodium chloride IVPB (1,000 mg Intravenous New Bag/Given 07/16/20 2101)  levETIRAcetam (KEPPRA) IVPB 1000 mg/100 mL premix ( Intravenous Stopped 07/16/20 1957)  lactulose (CHRONULAC) 10 GM/15ML solution 30 g (30 g Per Tube Given 07/16/20 2052)  albumin human 5 % solution 25 g ( Intravenous Rate/Dose Verify 07/16/20 2101)    ED Course  I have reviewed the triage vital signs and the nursing notes.  Pertinent labs & imaging results that were available during my care of the patient were reviewed by me and considered in my medical decision making (see chart for details).  Clinical Course as of Jul 16 2314  Mon Jul 16, 2020  1739 CT Head Wo Contrast [MW]    Clinical Course User Index [MW] Chyrl Civatte, Talitha Givens   MDM Rules/Calculators/A&P                          49yo male with history of cirrhosis, hyperbilirubinemia, schizoaffective disorder,  PTSD, etoh dependence and withdrawal wit history of seizures who presents after being found unresponsive surrounded by four lokos on wellness check after he told family he was going to stop drinking.  Arrives to the ED with best GCS 7, hypothermic, hypotensive.    DDx includes alcohol withdrawal/nonconvulsive status epilepticus/post ictal state/delirium, worsening hepatic failure, hepatic encephalopathy, sepsis, GI bleed, other etoh/drug ingestion, ICH.  Intubated for airway protection.  Neurology consulted given initial concern for ?nonconvulsive status.   Ordered fluid/abx for possible sepsis given hypothermia. Evidence of dark emesis present in airway however no sign of melena on exam, hemoccult negative, doubt acute GI bleed.  TSH/free T4/cortisol pending.  Ammonia returned 207 and lactic acid 9.6. K on istat 2.3-ordered potassium and calcium replacement. Ordered lactulose. CT head and CMP pending at time of transfer of care to ICU team. Discussed with sister on the phone.   Final Clinical Impression(s) / ED Diagnoses Final diagnoses:  SBP (spontaneous bacterial peritonitis) (HCC)  Encephalopathy, hepatic (HCC)  Lactic acidosis  Alcoholic cirrhosis of liver with ascites ([place])  Glasgow coma scale total score 3-8, in the field (EMT or ambulance) ([place])  Hypothermia, initial encounter    Rx / DC Orders ED Discharge Orders    None       Gareth Morgan, MD 07/16/20 2316

## 2020-07-16 NOTE — Consult Note (Signed)
Neurology Consultation  CC: encephalopathy vs seizure  ED MD consulted for ? seizure, ETOH withdrawal?   History is obtained from:chart, ED MD  HPI: [name] is a 48 y.o. male with a known hx of liver cirrhosis due to ETOH abuse, anemia, hx rhabdomyolysis, schizoaffective disorder, bipolar disorder,  seizures related to ETOH, PTSD, HTN, GERD, depression, and anxiety. Pt presents to ED by EMS after family called police for a wellness check. EMS found pt on the floor unresponsive. Per family, he had been trying for 2 days to stop ETOH. On the scene, there was reported 20 cans of ETOH on premises.   Per ED MD note, sister last saw pt about 5 days ago and pt was more jaundiced, LLE swollen, and pt was more "slow" mentally. He was out of Seroquel and Sertaline at that visit. Mom spoke to him 1.5 days ago and pt stated he was going to stop drinking.   Pt was found to be encephalopathic on arrival with ? Seizure activity. Pt was intubated around 3:30pm and was given paralytic which had likely worn off at time of exam. However, pt remained on Propofol drip.   On writer's exam, pt is completely unresponsive with GCS of 3.    ROS: Unable to obtain due to altered mental status.   Past Medical History:  Diagnosis Date  . Alcohol abuse   . Anxiety    at age 61  . Depression    at age 26  . GERD (gastroesophageal reflux disease)   . Hypertension   . Psychosis ([place])   . PTSD (post-traumatic stress disorder)   . Schizoaffective disorder   . Seizure disorder ([place])    related to etoh seizure  . Symptomatic anemia      Family History  Problem Relation Age of Onset  . Alcoholism Mother   . CAD Father   . Depression Brother      Social History:  reports that he has been smoking cigarettes. He has a 45.00 pack-year smoking history. He has never used smokeless tobacco. He reports current alcohol use. He reports current drug use. Frequency: 1.00 time per week. Drug: Marijuana.   Prior  to Admission medications   Medication Sig Start Date End Date Taking? Authorizing Provider  allopurinol (ZYLOPRIM) 100 MG tablet Take 2 tablets (200 mg total) by mouth daily. 04/20/20   Nicolette Bang, DO  loperamide (IMODIUM) 2 MG capsule Take 4 mg by mouth as needed for diarrhea or loose stools.    [provider]  metoprolol tartrate (LOPRESSOR) 25 MG tablet Take 0.5 tablets (12.5 mg total) by mouth 2 (two) times daily. 07/11/20 08/10/20  Nicolette Bang, DO  MILK THISTLE PO Take 1 capsule by mouth daily.    [provider]  Misc. Devices MISC Blood Pressure monitor. Dx :Hypertension 06/09/19   [name], MD  Multiple Vitamin (MULTIVITAMIN WITH MINERALS) TABS tablet Take 1 tablet by mouth daily. 05/08/20   [place] Eric J, DO  pantoprazole (PROTONIX) 40 MG tablet Take 1 tablet (40 mg total) by mouth daily. 07/11/20 08/10/20  Nicolette Bang, DO  QUEtiapine (SEROQUEL) 50 MG tablet Take 1 tablet (50 mg total) by mouth at bedtime. 06/06/20 07/06/20  Nicolette Bang, DO  sertraline (ZOLOFT) 25 MG tablet Take 1 tablet (25 mg total) by mouth daily. 07/11/20   Nicolette Bang, DO     Exam: Current vital signs: BP 96/61   Pulse 72   Resp 18   Ht  6' (1.829 m)   Wt 98.4 kg   SpO2 98%   BMI 29.42 kg/m    Physical Exam  Constitutional: Appears well-developed and well-nourished. Obese.  Psych: unresponsive.  Eyes: + icterus HENT: intubated Head: Normocephalic.  Cardiovascular: Normal rate and regular rhythm.  Respiratory: on vent Skin: Multiple areas of petechiae. Very jaundice.   Neuro: GCS 3- 1 for best motor, 1 for best eye, and 1 for best verbal response Mental Status: Patient is unresponsive to noxious stimuli including sternal rub, reflex hammer, Babinski, and loud clap.  Cranial Nerves: II: Pupils are 25mm equal, round, and reactive to light. No blink to threat.   III,IV, VI: eye's midline. No nystagmus,  absent Doll's Eyes V: absent corneal reflex.  VII: No facial movement. Mouth is without obvious droop.  VIII: no response to voice or loud clapping.  X: Ventilated.  XI: Follows no commands for shoulder shrug. Head is midline.  XII: intubated   Motor: Tone is flaccid x 4 extremities. Bulk is normal. Unable to test strength. No response to noxious stimuli.  Sensory: No withdrawal to pain.  Deep Tendon Reflexes: Flat x 4 extremities. Plantars: Toes are downgoing bilaterally.  Cerebellar: No tremors or clonus noted.  Gait: deferred.    I have reviewed labs in epic and the pertinent results are:  Lab Results  Component Value Date/Time   CHOL 159 03/02/2019 02:15 AM    Results for orders placed or performed during the hospital encounter of 07/16/20 (from the past 48 hour(s))  CBG monitoring, ED     Status: Abnormal   Collection Time: 07/16/20  3:19 PM  Result Value Ref Range   Glucose-Capillary 132 (H) 70 - 99 mg/dL    Comment: Glucose reference range applies only to samples taken after fasting for at least 8 hours.  Type and screen [place]     Status: None   Collection Time: 07/16/20  3:29 PM  Result Value Ref Range   ABO/RH(D) O POS    Antibody Screen NEG    Sample Expiration      07/19/2020,2359 Performed at [facility], [place] [address], [place], [place] [address]   I-stat chem 8, ed     Status: Abnormal   Collection Time: 07/16/20  3:36 PM  Result Value Ref Range   Sodium 134 (L) 135 - 145 mmol/L   Potassium 2.3 (LL) 3.5 - 5.1 mmol/L   Chloride 79 (L) 98 - 111 mmol/L   BUN 34 (H) 6 - 20 mg/dL   Creatinine, Ser 1.20 0.61 - 1.24 mg/dL   Glucose, Bld 131 (H) 70 - 99 mg/dL    Comment: Glucose reference range applies only to samples taken after fasting for at least 8 hours.   Calcium, Ion 0.70 (LL) 1.15 - 1.40 mmol/L   TCO2 35 (H) 22 - 32 mmol/L   Hemoglobin 12.9 (L) 13.0 - 17.0 g/dL   HCT 38.0 (L) 39 - 52 %   Comment NOTIFIED PHYSICIAN    Lactic acid, plasma     Status: Abnormal   Collection Time: 07/16/20  3:41 PM  Result Value Ref Range   Lactic Acid, Venous 9.6 (HH) 0.5 - 1.9 mmol/L    Comment: ICTERUS AT THIS LEVEL MAY AFFECT RESULT CRITICAL RESULT CALLED TO, READ BACK BY AND VERIFIED WITH: P.PULLIAM,RN @1633  07/16/2020 VANG.J Performed at [facility], [place] [address], [place], [place] [address]   Comprehensive metabolic panel     Status: Abnormal (  Preliminary result)   Collection Time: 07/16/20  3:41 PM  Result Value Ref Range   Sodium 136 135 - 145 mmol/L   Potassium 2.6 (LL) 3.5 - 5.1 mmol/L    Comment: CRITICAL RESULT CALLED TO, READ BACK BY AND VERIFIED WITH: C.GOLSSON,RN @1642  07/16/2020 VANG.J    Chloride 80 (L) 98 - 111 mmol/L   CO2 31 22 - 32 mmol/L   Glucose, Bld 138 (H) 70 - 99 mg/dL    Comment: Glucose reference range applies only to samples taken after fasting for at least 8 hours.   BUN 26 (H) 6 - 20 mg/dL   Creatinine, Ser 1.68 (H) 0.61 - 1.24 mg/dL   Calcium 6.7 (L) 8.9 - 10.3 mg/dL   Total Protein 6.3 (L) 6.5 - 8.1 g/dL   Albumin 1.8 (L) 3.5 - 5.0 g/dL   AST PENDING 15 - 41 U/L   ALT PENDING 0 - 44 U/L   Alkaline Phosphatase 265 (H) 38 - 126 U/L   Total Bilirubin 21.1 (HH) 0.3 - 1.2 mg/dL    Comment: ICTERUS AT THIS LEVEL MAY AFFECT RESULT CRITICAL RESULT CALLED TO, READ BACK BY AND VERIFIED WITH: C.GOLSSON,RN @1642  07/16/2020 VANG.J    GFR, Estimated 50 (L) >60 mL/min    Comment: (NOTE) Calculated using the CKD-EPI Creatinine Equation (2021)    Anion gap 25 (H) 5 - 15    Comment: Performed at [facility] [address], [place], [place] [address]  CBC WITH DIFFERENTIAL     Status: Abnormal   Collection Time: 07/16/20  3:41 PM  Result Value Ref Range   WBC 14.2 (H) 4.0 - 10.5 K/uL   RBC 3.66 (L) 4.22 - 5.81 MIL/uL   Hemoglobin 11.5 (L) 13.0 - 17.0 g/dL   HCT 32.2 (L) 39 - 52 %   MCV 88.0 80.0 - 100.0 fL   MCH 31.4 26.0 - 34.0 pg   MCHC 35.7 30.0 - 36.0 g/dL   RDW  20.9 (H) 11.5 - 15.5 %   Platelets 156 150 - 400 K/uL    Comment: REPEATED TO VERIFY SPECIMEN CHECKED FOR CLOTS    nRBC 0.0 0.0 - 0.2 %   Neutrophils Relative % 84 %   Neutro Abs 12.0 (H) 1.7 - 7.7 K/uL   Lymphocytes Relative 9 %   Lymphs Abs 1.3 0.7 - 4.0 K/uL   Monocytes Relative 6 %   Monocytes Absolute 0.8 0.1 - 1.0 K/uL   Eosinophils Relative 0 %   Eosinophils Absolute 0.0 0.0 - 0.5 K/uL   Basophils Relative 0 %   Basophils Absolute 0.0 0.0 - 0.1 K/uL   Immature Granulocytes 1 %   Abs Immature Granulocytes 0.17 (H) 0.00 - 0.07 K/uL    Comment: Performed at [facility] [address], [place], [place] [address]  Protime-INR     Status: Abnormal   Collection Time: 07/16/20  3:41 PM  Result Value Ref Range   Prothrombin Time 18.1 (H) 11.4 - 15.2 seconds   INR 1.6 (H) 0.8 - 1.2    Comment: (NOTE) INR goal varies based on device and disease states. Performed at [facility], [place] [address], [place], [place] [address]   APTT     Status: Abnormal   Collection Time: 07/16/20  3:41 PM  Result Value Ref Range   aPTT 47 (H) 24 - 36 seconds    Comment:        IF BASELINE aPTT IS ELEVATED, SUGGEST PATIENT  RISK ASSESSMENT BE USED TO DETERMINE APPROPRIATE ANTICOAGULANT THERAPY. Performed at [facility], [place] [address] [place], [place] [address]   Magnesium     Status: Abnormal   Collection Time: 07/16/20  3:41 PM  Result Value Ref Range   Magnesium 1.3 (L) 1.7 - 2.4 mg/dL    Comment: Performed at [facility] [address], [place], [place] [address]  Ammonia     Status: Abnormal   Collection Time: 07/16/20  3:43 PM  Result Value Ref Range   Ammonia 207 (H) 9 - 35 umol/L    Comment: Performed at [facility], [place] [address] [place], [place] [address]  ETOH, ASA, Tylenol levels pending.  ++Ammonia 207 UDS pending TSH, free T4, CK, CMP, cortisol pending LA 9.6  DG Chest Portable 1 View  Result Date: 07/16/2020 CLINICAL DATA:  Post  intubation. EXAM: PORTABLE CHEST 1 VIEW COMPARISON:  July 16, 2020 FINDINGS: Endotracheal tube now approximately 2.7 cm from the carina. Heart size top normal accentuated by portable technique and low lung volumes. Linear opacities at the LEFT lung base. Vascular crowding. No lobar consolidation or sign of pleural effusion. On limited assessment no acute skeletal process. IMPRESSION: 1. Endotracheal tube now approximately 2.7 cm from the carina, retracted approximately 1 cm since the prior study. 2. Low lung volumes with basilar atelectasis. 3. No signs of consolidation or effusion. Electronically Signed   By: Zetta Bills M.D.   On: 07/16/2020 16:20   DG Chest Port 1 View  Result Date: 07/16/2020 CLINICAL DATA:  Hypoxia EXAM: PORTABLE CHEST 1 VIEW COMPARISON:  May 04, 2020. FINDINGS: Endotracheal tube tip is 1.6 cm above the carina. No pneumothorax. There is mild left base atelectasis. Lungs elsewhere are clear. Heart size and pulmonary vascularity are normal. No adenopathy. No bone lesions. IMPRESSION: Endotracheal tube as described without pneumothorax. Mild left base atelectasis. Lungs elsewhere clear. Cardiac silhouette normal. Electronically Signed   By: Lowella Grip III M.D.   On: 07/16/2020 16:11   DG Abd Portable 1V  Result Date: 07/16/2020 CLINICAL DATA:  Orogastric tube placement EXAM: PORTABLE ABDOMEN - 1 VIEW COMPARISON:  None. FINDINGS: Orogastric tube tip and side port are in the stomach. There is no bowel dilatation or air-fluid level to suggest bowel obstruction. No free air. Lung bases clear. IMPRESSION: Orogastric tube tip and side port in stomach. No bowel obstruction or free air evident. Lung bases clear. Electronically Signed   By: Lowella Grip III M.D.   On: 07/16/2020 16:35     I have reviewed the images obtained:   CTH without contrast: IMPRESSION: 1. No acute intracranial findings. 2. Patient intubated at the time exam. 3. Chronic paranasal sinus  disease.  CEEG: pending  Primary Diagnosis:  1. Toxic vs metabolic encephalopathy vs seizure from ETOH as he has hx same.  2. hyperammonia-likely due to liver issues, but will follow CT to r/o cerebral edema.   Secondary Diagnosis: 1. ETOH abuse 2. THC use 3. Liver cirrhosis 4. HTN 5. Code sepsis.     Impression: 49 yo male with a hx ETOH abuse, ETOH cirrhosis of liver, HTN, multi substance abuse, PTSD, depression, anxiety, and GERD who was found down by police today after family called police for welfare check. Pt encephalopathic on arrival and on scene. Intubated in ED with use of paralytic and now on sedative gtt. Exam GCS 3. No corneal reflex. No response to noxious stimuli. Non verbal. Follows no commands. No clonus  on exam. Await CT head to r/o causes of encephalopathy and to r/o cerebral edema given ammonia level of 207. EEG overnight. Na 134, not low enough to cause seizure.   Recommendations: 1. Overnight EEG.  2. Load Keppra 2 gms (ordered and given)  2. Thiamine IV. (ordered) 3. PCCM involved. 4. Neuro to follow up CT and EEG.  5. ETOH cessation education. May need rehab. 6. Maintenance AED if cEEG shows seizures.  Pt seen by Clance Boll, NP and Dr. Meredeth Ide.    NEUROHOSPITALIST ADDENDUM Performed a face to face diagnostic evaluation.   I have reviewed the contents of history and physical exam as documented by PA/ARNP/Resident and agree with above documentation.  I have discussed and formulated the above plan as documented. Edits to the note have been made as needed.  Impression: 8M EtOH use + cirrhosis + substance use found down and now intubated. Key exam findings: Appears jaundiced, GCS 3 on my eval, positive pupils BL but rest of brainstem reflexes absent. Plan: CTH reviewed and negative for ICH or cerebral edema. cEEG to rule out subclinical status along with Vit B1. Loaded with Keppra in the meantime until cEEG is hooked up.  Donnetta Simpers, MD [facility] [phone]   If 7pm to 7am, please call on call as listed on AMION.

## 2020-07-16 NOTE — Progress Notes (Signed)
[facility] Progress Note Patient Name: [name] DOB: 05-Nov-1970 MRN: [id number]   Date of Service  07/16/2020  HPI/Events of Note  25 M HFpEF, ETOH abuse, schizoaffective disorder brought in after being found down at home. Noted to have some jerking movements while in the ED. Ammonia level 207, alcohol level <10. Last seen well but jaundiced 5-6 days ago by sister. Mom spoke to him 2 days prior saying he was going to quit drinking.  Intubated and sedated on propofol. On norepinephrine. Fluids ongoing.  eICU Interventions  Hepatic encephalopathy, seizure, EEG done result pending. Bowel regimen being given. Replacing K      Intervention Category Major Interventions: Respiratory failure - evaluation and management;Change in mental status - evaluation and management;Seizures - evaluation and management Evaluation Type: New Patient Evaluation  Judd Lien 07/16/2020, 8:36 PM

## 2020-07-16 NOTE — ED Triage Notes (Addendum)
Pt was brought by EMS. Family called police  For wellness check. When police arrived pt was found on the floor. Pt told family a couple of days ago he is trying to withdrawal from alcohol. EMS states pt had 20 cans of alcohol around him on scene. Pt has a hx of seizure. Pt is unresponsive. Pt has hx of liver failure and presents jaundice.

## 2020-07-16 NOTE — ED Notes (Signed)
No bair huggers available in ED

## 2020-07-16 NOTE — Progress Notes (Signed)
Patient transported to 4N27 without complications.  

## 2020-07-16 NOTE — H&P (Signed)
NAME:  [name], MRN:  [id number], DOB:  [birth year], LOS: 0 ADMISSION DATE:  07/16/2020, CONSULTATION DATE:  07/16/20  REFERRING MD:  Gareth Morgan, CHIEF COMPLAINT:  Seizure   Brief History   [name] a 49 year old male with past medical history notable for EtOH use disorder, hepatic steatosis, essential hypertension, GERD, schizoaffective disorder presents after found down at home for concern for seizures, patient intubated in ED for for airway protection due to depressed consciousness.   History of present illness   Ryan McGintyis a 49 year old male with past medical history notable for EtOH use disorder, hepatic steatosis, essential hypertension, GERD, schizoaffective disorder presents after found down at home during a wellness check. History is limited given patient's level of consciousness. Per chart review patient was found at home with multiple cans of alcohol around him. Mother reported that patient states he was attempting to withdraw from alcohol a couple days ago. Sister last saw the patient 5-6 days ago at that time he was more jaundices and moving slower than at baseline.   In ED patient with decreased conciseness and seen to have jerking motions concerning for seizures. He was intubated with EEG ordered. Found to have  Elevated AST 207, ALT 68, Bili 21, Cr. 1.68. K 2.6, Mg 1.3,  albumin 1.8. Lactate of 9.6.  CBC with WBC of 14.2, hbg 11.5 improved from last admission of 8, plt 156. INR 1.6. Ammonia of 207. EKG with QT prolongation.  Past Medical History  He,  has a past medical history of Alcohol abuse, Anxiety, Depression, GERD (gastroesophageal reflux disease), Hypertension, Hepatic steatosis, Psychosis ([place]), PTSD (post-traumatic stress disorder), Schizoaffective disorder, Seizure disorder ([place]), and Symptomatic anemia.   Significant Hospital Events   11/29 Admission  Consults:  Neurology  Procedures:  11/29 Intubation  Significant Diagnostic Tests:   CT head  IMPRESSION: 1. No acute intracranial findings. 2. Patient intubated at the time exam. 3. Chronic paranasal sinus disease. Micro Data:  11/29 Blood cultures >>  Antimicrobials:  11/29 Vancomycin 11/29 Flagyl 11/29 Cefepime   Interim history/subjective:  See HPI  Objective   Blood pressure (!) 84/53, pulse 61, resp. rate 18, height 6' (1.829 m), weight 98.4 kg, SpO2 98 %.    Vent Mode: PRVC FiO2 (%):  [60 %-100 %] 60 % Set Rate:  [18 bmp] 18 bmp Vt Set:  [620 mL] 620 mL PEEP:  [5 cmH20] 5 cmH20   Intake/Output Summary (Last 24 hours) at 07/16/2020 1809 Last data filed at 07/16/2020 1758 Gross per 24 hour  Intake 1095.8 ml  Output -  Net 1095.8 ml   Filed Weights   07/16/20 1615  Weight: 98.4 kg    Examination: General: Sedate obese caucasian male Eyes: Pupils equal reactive to light, scleral icterus  HENT: ETT tube in place, mucous membranes moist Neck: trachea midline, no thyromegaly Lungs: Expiratory wheezes bilaterally, no accessory muscle use Cardiovascular: Regular rate and rhythm, no murmurs, no LE edema Abdomen: Distended, normal BS, no masses Skin: Jaundice, scattered petechiae across abdomen Neuro: Sedated, not responding to noxious stimuli, [facility] Problem list   None  Assessment & Plan:  Acute hepatic encephalopathy vs. Noncongestive status epilepticus  Hyperammonemia History of seizures from alcohol withdrawal Presented with decreased conciousness with possible seizure activity. Elevated ammonia of 204. Normal ethanol levels. Loaded on Keppra 2 g.  -Neurology consulted, greatly appreciate recommendations - Continue lactulose 30 mg every 8 hours - Overnight EEG to monitor for seizure - IV thiamine, folate per  tube - Intubated to protect airway, follow up ABG - VAP protocol  - Continue propofol, midazolam, and fentayl - Seizure percautions  Alcoholic hepatitis  Hyperbilirubinemia Lactic acidosis Sepsis Presents  with elevated LFTs AST>ALT c/w alcohol use. Maddrey Score of 34 would benefit from glucocorticoids. Negative for acetaminophen, salicylate, and ethanol on admission. - Start methylprednisolone 32 mg daily, will check Lille  score in 7 days to assess benefit - RUQ [place] to rule out SBP - Continue IVF, albumin - Continue vanc, cefepime, and flagyl for SBP prophylaxis  - Urine drug screen - Trend lactate - Follow up blood cultures  Prolonged QT Qtc prolonged at 600, hold home Seroquel and sertraline  AKI Presents with elevated Cr. Of 1.68, baseline ~0.7. -IVF -Monitor kidney function -Strict I/Os  Hypokalemia, Hypomagnesemia - Replace - Monitor electrolytes  Alcohol use disorder Negative for ethanol on admission  Anemia Hgb of 11.6 improved from last admission, FOBT negative. Monitor hgb,   HTN On metoprolol tartrate 12.5 twice daily, hold for now as Bps have been soft.  Schizoaffective Disorder - Hold home Seroquel and Zoloft in setting of Qt prolongation.  Best practice (evaluated daily)  Diet: NPO Pain/Anxiety/Delirium protocol (if indicated): Fentanyl, versed, propfol VAP protocol (if indicated): Yes DVT prophylaxis: Heparin GI prophylaxis: Pantoprazole  Glucose control: None Mobility: Bed rest last date of multidisciplinary goals of care discussion N/a Family and staff present N/a Summary of discussion N/a Code Status: Full Disposition: ICU  Labs   CBC: Recent Labs  Lab 07/16/20 1536 07/16/20 1541 07/16/20 1720  WBC  --  14.2*  --   NEUTROABS  --  12.0*  --   HGB 12.9* 11.5* 12.9*  HCT 38.0* 32.2* 38.0*  MCV  --  88.0  --   PLT  --  156  --     Basic Metabolic Panel: Recent Labs  Lab 07/16/20 1536 07/16/20 1541 07/16/20 1720  NA 134* 136 134*  K 2.3* 2.6* <2.0*  CL 79* 80*  --   CO2  --  31  --   GLUCOSE 131* 138*  --   BUN 34* 26*  --   CREATININE 1.20 1.68*  --   CALCIUM  --  6.7*  --   MG  --  1.3*  --    GFR: Estimated Creatinine  Clearance: 66 mL/min (A) (by C-G formula based on SCr of 1.68 mg/dL (H)). Recent Labs  Lab 07/16/20 1541  WBC 14.2*  LATICACIDVEN 9.6*    Liver Function Tests: Recent Labs  Lab 07/16/20 1541  AST 207*  ALT 68*  ALKPHOS 265*  BILITOT 21.1*  PROT 6.3*  ALBUMIN 1.8*   No results for input(s): LIPASE, AMYLASE in the last 168 hours. Recent Labs  Lab 07/16/20 1543  AMMONIA 207*    ABG    Component Value Date/Time   PHART 7.542 (H) 07/16/2020 1720   PCO2ART 47.5 07/16/2020 1720   PO2ART 358 (H) 07/16/2020 1720   HCO3 40.9 (H) 07/16/2020 1720   TCO2 42 (H) 07/16/2020 1720   O2SAT 100.0 07/16/2020 1720     Coagulation Profile: Recent Labs  Lab 07/16/20 1541  INR 1.6*    Cardiac Enzymes: Recent Labs  Lab 07/16/20 1541  CKTOTAL 183    HbA1C: Hemoglobin A1C  Date/Time Value Ref Range Status  09/13/2015 09:14 AM 5.30  Final   Hgb A1c MFr Bld  Date/Time Value Ref Range Status  06/03/2018 06:39 AM 5.3 4.8 - 5.6 % Final    Comment:    (  NOTE) Pre diabetes:          5.7%-6.4% Diabetes:              >6.4% Glycemic control for   <7.0% adults with diabetes     CBG: Recent Labs  Lab 07/16/20 1519  GLUCAP 132*   Past Medical History  He,  has a past medical history of Alcohol abuse, Anxiety, Depression, GERD (gastroesophageal reflux disease), Hypertension, Psychosis ([place]), PTSD (post-traumatic stress disorder), Schizoaffective disorder, Seizure disorder ([place]), and Symptomatic anemia.   Surgical History    Past Surgical History:  Procedure Laterality Date  . ESOPHAGOGASTRODUODENOSCOPY (EGD) WITH PROPOFOL N/A 05/05/2020   Procedure: ESOPHAGOGASTRODUODENOSCOPY (EGD) WITH PROPOFOL;  Surgeon: [name], MD;  Location: [facility];  Service: Endoscopy;  Laterality: N/A;  . FOOT SURGERY     right  . HAND SURGERY       Social History   reports that he has been smoking cigarettes. He has a 45.00 pack-year smoking history. He has never used smokeless  tobacco. He reports current alcohol use. He reports current drug use. Frequency: 1.00 time per week. Drug: Marijuana.   Family History   His family history includes Alcoholism in his mother; CAD in his father; Depression in his brother.   Allergies Allergies  Allergen Reactions  . Wellbutrin [Bupropion] Other (See Comments)    Caused seizures     Home Medications  Prior to Admission medications   Medication Sig Start Date End Date Taking? Authorizing Provider  allopurinol (ZYLOPRIM) 100 MG tablet Take 2 tablets (200 mg total) by mouth daily. 04/20/20   Nicolette Bang, DO  loperamide (IMODIUM) 2 MG capsule Take 4 mg by mouth as needed for diarrhea or loose stools.    [provider]  metoprolol tartrate (LOPRESSOR) 25 MG tablet Take 0.5 tablets (12.5 mg total) by mouth 2 (two) times daily. 07/11/20 08/10/20  Nicolette Bang, DO  MILK THISTLE PO Take 1 capsule by mouth daily.    [provider]  Misc. Devices MISC Blood Pressure monitor. Dx :Hypertension 06/09/19   [name], MD  Multiple Vitamin (MULTIVITAMIN WITH MINERALS) TABS tablet Take 1 tablet by mouth daily. 05/08/20   [place] Eric J, DO  pantoprazole (PROTONIX) 40 MG tablet Take 1 tablet (40 mg total) by mouth daily. 07/11/20 08/10/20  Nicolette Bang, DO  QUEtiapine (SEROQUEL) 50 MG tablet Take 1 tablet (50 mg total) by mouth at bedtime. 06/06/20 07/06/20  Nicolette Bang, DO  sertraline (ZOLOFT) 25 MG tablet Take 1 tablet (25 mg total) by mouth daily. 07/11/20   Nicolette Bang, DO   Iona Beard PGY-1 Internal Medicine 07/16/20 6:37 PM

## 2020-07-16 NOTE — ED Notes (Signed)
Got patient on the monitor did ekg shown to Dr Billy Fischer patient has nurse at beside

## 2020-07-16 NOTE — Sepsis Progress Note (Signed)
Notified bedside nurse of need to draw and administer repeat lactic acid and fluid bolus.  °

## 2020-07-16 NOTE — Progress Notes (Signed)
EEG complete - results pending 

## 2020-07-16 NOTE — ED Notes (Signed)
RN attempted to call report 

## 2020-07-16 NOTE — Sepsis Progress Note (Signed)
Sepsis protocol being followed by eLink 

## 2020-07-16 NOTE — Progress Notes (Signed)
vLTM started in ER   Neurology notified.  Same leads used for baseline EEG today

## 2020-07-17 ENCOUNTER — Inpatient Hospital Stay (HOSPITAL_COMMUNITY): Payer: Medicare Other

## 2020-07-17 ENCOUNTER — Inpatient Hospital Stay: Payer: Self-pay

## 2020-07-17 DIAGNOSIS — K7290 Hepatic failure, unspecified without coma: Secondary | ICD-10-CM | POA: Diagnosis not present

## 2020-07-17 DIAGNOSIS — R9401 Abnormal electroencephalogram [EEG]: Secondary | ICD-10-CM

## 2020-07-17 DIAGNOSIS — R569 Unspecified convulsions: Secondary | ICD-10-CM

## 2020-07-17 DIAGNOSIS — L8990 Pressure ulcer of unspecified site, unspecified stage: Secondary | ICD-10-CM | POA: Insufficient documentation

## 2020-07-17 DIAGNOSIS — R4182 Altered mental status, unspecified: Secondary | ICD-10-CM | POA: Diagnosis not present

## 2020-07-17 LAB — GLUCOSE, CAPILLARY
Glucose-Capillary: 123 mg/dL — ABNORMAL HIGH (ref 70–99)
Glucose-Capillary: 127 mg/dL — ABNORMAL HIGH (ref 70–99)
Glucose-Capillary: 152 mg/dL — ABNORMAL HIGH (ref 70–99)
Glucose-Capillary: 156 mg/dL — ABNORMAL HIGH (ref 70–99)
Glucose-Capillary: 168 mg/dL — ABNORMAL HIGH (ref 70–99)
Glucose-Capillary: 272 mg/dL — ABNORMAL HIGH (ref 70–99)

## 2020-07-17 LAB — HEMOGLOBIN A1C
Hgb A1c MFr Bld: 5.7 % — ABNORMAL HIGH (ref 4.8–5.6)
Mean Plasma Glucose: 116.89 mg/dL

## 2020-07-17 LAB — COMPREHENSIVE METABOLIC PANEL
ALT: 44 U/L (ref 0–44)
AST: 166 U/L — ABNORMAL HIGH (ref 15–41)
Albumin: 1.8 g/dL — ABNORMAL LOW (ref 3.5–5.0)
Alkaline Phosphatase: 175 U/L — ABNORMAL HIGH (ref 38–126)
Anion gap: 17 — ABNORMAL HIGH (ref 5–15)
BUN: 23 mg/dL — ABNORMAL HIGH (ref 6–20)
CO2: 29 mmol/L (ref 22–32)
Calcium: 6.5 mg/dL — ABNORMAL LOW (ref 8.9–10.3)
Chloride: 88 mmol/L — ABNORMAL LOW (ref 98–111)
Creatinine, Ser: 1.68 mg/dL — ABNORMAL HIGH (ref 0.61–1.24)
GFR, Estimated: 50 mL/min — ABNORMAL LOW (ref >60)
Glucose, Bld: 131 mg/dL — ABNORMAL HIGH (ref 70–99)
Potassium: <2 mmol/L — CL (ref 3.5–5.1)
Sodium: 134 mmol/L — ABNORMAL LOW (ref 135–145)
Total Bilirubin: 18.3 mg/dL (ref 0.3–1.2)
Total Protein: 5.4 g/dL — ABNORMAL LOW (ref 6.5–8.1)

## 2020-07-17 LAB — PROCALCITONIN: Procalcitonin: 1.3 ng/mL

## 2020-07-17 LAB — BASIC METABOLIC PANEL
Anion gap: 16 — ABNORMAL HIGH (ref 5–15)
BUN: 24 mg/dL — ABNORMAL HIGH (ref 6–20)
CO2: 30 mmol/L (ref 22–32)
Calcium: 6.5 mg/dL — ABNORMAL LOW (ref 8.9–10.3)
Chloride: 90 mmol/L — ABNORMAL LOW (ref 98–111)
Creatinine, Ser: 1.62 mg/dL — ABNORMAL HIGH (ref 0.61–1.24)
GFR, Estimated: 52 mL/min — ABNORMAL LOW (ref >60)
Glucose, Bld: 169 mg/dL — ABNORMAL HIGH (ref 70–99)
Potassium: <2 mmol/L — CL (ref 3.5–5.1)
Sodium: 136 mmol/L (ref 135–145)

## 2020-07-17 LAB — HEPATITIS PANEL, ACUTE
HCV Ab: NONREACTIVE
Hep A IgM: NONREACTIVE
Hep B C IgM: NONREACTIVE
Hepatitis B Surface Ag: NONREACTIVE

## 2020-07-17 LAB — CBC
HCT: 26.3 % — ABNORMAL LOW (ref 39.0–52.0)
Hemoglobin: 9.6 g/dL — ABNORMAL LOW (ref 13.0–17.0)
MCH: 31 pg (ref 26.0–34.0)
MCHC: 36.5 g/dL — ABNORMAL HIGH (ref 30.0–36.0)
MCV: 84.8 fL (ref 80.0–100.0)
Platelets: 130 10*3/uL — ABNORMAL LOW (ref 150–400)
RBC: 3.1 MIL/uL — ABNORMAL LOW (ref 4.22–5.81)
RDW: 19.2 % — ABNORMAL HIGH (ref 11.5–15.5)
WBC: 12.1 10*3/uL — ABNORMAL HIGH (ref 4.0–10.5)
nRBC: 0 % (ref 0.0–0.2)

## 2020-07-17 LAB — LACTIC ACID, PLASMA: Lactic Acid, Venous: 2.6 mmol/L (ref 0.5–1.9)

## 2020-07-17 LAB — PHOSPHORUS
Phosphorus: 1.5 mg/dL — ABNORMAL LOW (ref 2.5–4.6)
Phosphorus: 3 mg/dL (ref 2.5–4.6)

## 2020-07-17 LAB — PROTIME-INR
INR: 1.5 — ABNORMAL HIGH (ref 0.8–1.2)
Prothrombin Time: 17.7 seconds — ABNORMAL HIGH (ref 11.4–15.2)

## 2020-07-17 LAB — APTT: aPTT: 53 seconds — ABNORMAL HIGH (ref 24–36)

## 2020-07-17 LAB — MAGNESIUM
Magnesium: 1.6 mg/dL — ABNORMAL LOW (ref 1.7–2.4)
Magnesium: 1.6 mg/dL — ABNORMAL LOW (ref 1.7–2.4)

## 2020-07-17 LAB — MRSA PCR SCREENING: MRSA by PCR: NEGATIVE

## 2020-07-17 LAB — TRIGLYCERIDES: Triglycerides: 198 mg/dL — ABNORMAL HIGH (ref <150)

## 2020-07-17 LAB — AMMONIA: Ammonia: 114 umol/L — ABNORMAL HIGH (ref 9–35)

## 2020-07-17 MED ORDER — SODIUM CHLORIDE 0.9 % IV SOLN
INTRAVENOUS | Status: DC | PRN
  Administered 2020-07-17: 500 mL via INTRAVENOUS

## 2020-07-17 MED ORDER — ORAL CARE MOUTH RINSE
15.0000 mL | OROMUCOSAL | Status: DC
  Administered 2020-07-17 – 2020-07-24 (×67): 15 mL via OROMUCOSAL

## 2020-07-17 MED ORDER — INSULIN ASPART 100 UNIT/ML ~~LOC~~ SOLN
0.0000 [IU] | Freq: Three times a day (TID) | SUBCUTANEOUS | Status: DC
  Administered 2020-07-17: 2 [IU] via SUBCUTANEOUS
  Administered 2020-07-17: 3 [IU] via SUBCUTANEOUS

## 2020-07-17 MED ORDER — MAGNESIUM SULFATE 2 GM/50ML IV SOLN
2.0000 g | Freq: Once | INTRAVENOUS | Status: AC
  Administered 2020-07-17: 2 g via INTRAVENOUS
  Filled 2020-07-17: qty 50

## 2020-07-17 MED ORDER — CHLORHEXIDINE GLUCONATE CLOTH 2 % EX PADS
6.0000 | MEDICATED_PAD | Freq: Every day | CUTANEOUS | Status: DC
  Administered 2020-07-17 – 2020-08-06 (×19): 6 via TOPICAL

## 2020-07-17 MED ORDER — ALBUMIN HUMAN 25 % IV SOLN
25.0000 g | Freq: Four times a day (QID) | INTRAVENOUS | Status: AC
  Administered 2020-07-17 – 2020-07-18 (×3): 25 g via INTRAVENOUS
  Filled 2020-07-17 (×3): qty 100

## 2020-07-17 MED ORDER — CHLORHEXIDINE GLUCONATE 0.12% ORAL RINSE (MEDLINE KIT)
15.0000 mL | Freq: Two times a day (BID) | OROMUCOSAL | Status: DC
  Administered 2020-07-17 – 2020-07-24 (×15): 15 mL via OROMUCOSAL

## 2020-07-17 MED ORDER — INSULIN ASPART 100 UNIT/ML ~~LOC~~ SOLN
0.0000 [IU] | SUBCUTANEOUS | Status: DC
  Administered 2020-07-17 – 2020-07-18 (×3): 3 [IU] via SUBCUTANEOUS
  Administered 2020-07-18 (×2): 2 [IU] via SUBCUTANEOUS
  Administered 2020-07-18 (×3): 3 [IU] via SUBCUTANEOUS
  Administered 2020-07-19 (×2): 2 [IU] via SUBCUTANEOUS
  Administered 2020-07-19 (×2): 3 [IU] via SUBCUTANEOUS
  Administered 2020-07-19 (×2): 2 [IU] via SUBCUTANEOUS
  Administered 2020-07-19: 3 [IU] via SUBCUTANEOUS
  Administered 2020-07-20 (×2): 2 [IU] via SUBCUTANEOUS
  Administered 2020-07-20 (×2): 3 [IU] via SUBCUTANEOUS
  Administered 2020-07-20 – 2020-07-22 (×9): 2 [IU] via SUBCUTANEOUS
  Administered 2020-07-22: 3 [IU] via SUBCUTANEOUS
  Administered 2020-07-22: 2 [IU] via SUBCUTANEOUS
  Administered 2020-07-23: 3 [IU] via SUBCUTANEOUS
  Administered 2020-07-23 – 2020-07-24 (×5): 2 [IU] via SUBCUTANEOUS
  Administered 2020-07-24: 3 [IU] via SUBCUTANEOUS
  Administered 2020-07-24 (×2): 2 [IU] via SUBCUTANEOUS
  Administered 2020-07-24: 3 [IU] via SUBCUTANEOUS
  Administered 2020-07-24 – 2020-07-25 (×3): 2 [IU] via SUBCUTANEOUS
  Administered 2020-07-25: 3 [IU] via SUBCUTANEOUS
  Administered 2020-07-26: 2 [IU] via SUBCUTANEOUS
  Administered 2020-07-26: 3 [IU] via SUBCUTANEOUS
  Administered 2020-07-26: 2 [IU] via SUBCUTANEOUS

## 2020-07-17 MED ORDER — POTASSIUM PHOSPHATES 15 MMOLE/5ML IV SOLN
30.0000 mmol | Freq: Once | INTRAVENOUS | Status: AC
  Administered 2020-07-17: 30 mmol via INTRAVENOUS
  Filled 2020-07-17: qty 10

## 2020-07-17 MED ORDER — POTASSIUM CHLORIDE 20 MEQ PO PACK
40.0000 meq | PACK | ORAL | Status: AC
  Administered 2020-07-17 (×3): 40 meq
  Filled 2020-07-17 (×3): qty 2

## 2020-07-17 MED ORDER — PROSOURCE TF PO LIQD
45.0000 mL | Freq: Two times a day (BID) | ORAL | Status: DC
  Administered 2020-07-17: 45 mL
  Filled 2020-07-17: qty 45

## 2020-07-17 MED ORDER — POTASSIUM CHLORIDE 10 MEQ/100ML IV SOLN
10.0000 meq | INTRAVENOUS | Status: AC
  Administered 2020-07-17 (×5): 10 meq via INTRAVENOUS
  Filled 2020-07-17 (×5): qty 100

## 2020-07-17 MED ORDER — VITAL 1.5 CAL PO LIQD
1000.0000 mL | ORAL | Status: DC
  Administered 2020-07-17: 1000 mL
  Filled 2020-07-17: qty 1000

## 2020-07-17 NOTE — Progress Notes (Addendum)
NAME:  [name], MRN:  [id number], DOB:  May 08, 1971, LOS: 1 ADMISSION DATE:  07/16/2020, CONSULTATION DATE:  07/17/20  REFERRING MD:  Gareth Morgan, CHIEF COMPLAINT:  Seizure   Brief History   [name] a 49 year old male with past medical history notable for EtOH use disorder, hepatic steatosis, essential hypertension, GERD, schizoaffective disorder presents after found down at home for concern for seizures, patient intubated in ED for for airway protection due to depressed consciousness.   History of present illness   [name] a 49 year old male with past medical history notable for EtOH use disorder, hepatic steatosis, essential hypertension, GERD, schizoaffective disorder presents after found down at home during a wellness check. History is limited given patient's level of consciousness. Per chart review patient was found at home with multiple cans of alcohol around him. Mother reported that patient states he was attempting to withdraw from alcohol a couple days ago. Sister last saw the patient 5-6 days ago at that time he was more jaundices and moving slower than at baseline.   In ED patient with decreased conciseness and seen to have jerking motions concerning for seizures. He was intubated with EEG ordered. Found to have  Elevated AST 207, ALT 68, Bili 21, Cr. 1.68. K 2.6, Mg 1.3,  albumin 1.8. Lactate of 9.6.  CBC with WBC of 14.2, hbg 11.5 improved from last admission of 8, plt 156. INR 1.6. Ammonia of 207. EKG with QT prolongation.   Past Medical History  He,  has a past medical history of Alcohol abuse, Anxiety, Depression, GERD (gastroesophageal reflux disease), Hypertension, Hepatic steatosis, Psychosis ([place]), PTSD (post-traumatic stress disorder), Schizoaffective disorder, Seizure disorder ([place]), and Symptomatic anemia.   Significant Hospital Events   11/29 Admission  Consults:  Neurology  Procedures:  11/29 Intubation  Significant Diagnostic Tests:   CT head 11/29 > no acute  Micro Data:  11/29 Blood cultures >>  Antimicrobials:  11/29 Vancomycin 11/29 Flagyl 11/29 Cefepime   Interim history/subjective:  See HPI  Objective   Blood pressure (!) 122/54, pulse 91, temperature 98.6 F (37 C), resp. rate 16, height 6' (1.829 m), weight 98.4 kg, SpO2 96 %.    Vent Mode: PRVC FiO2 (%):  [40 %-100 %] 40 % Set Rate:  [15 bmp-18 bmp] 15 bmp Vt Set:  [620 mL] 620 mL PEEP:  [5 cmH20] 5 cmH20 Plateau Pressure:  [12 VQQ59-56 cmH20] 18 cmH20   Intake/Output Summary (Last 24 hours) at 07/17/2020 1051 Last data filed at 07/17/2020 0700 Gross per 24 hour  Intake 3569.68 ml  Output 500 ml  Net 3069.68 ml   Filed Weights   07/16/20 1615  Weight: 98.4 kg    Examination: General: Adult male on vent  Eyes: Jaundice noted and edema to orbits  HENT: ETT tube in place, mucous membranes moist Neck: trachea midline, no thyromegaly Lungs: Clear breath sounds, no wheeze/crackles  Cardiovascular: Regular rate and rhythm, no murmurs Abdomen: Distended, normal BS, no masses Skin: Jaundice, scattered petechiae across abdomen, generalized edema noted to uppers  Neuro: Sedated, not responding to noxious stimuli, +cough/gag. Withdrawals with upper extremities   Resolved Hospital Problem list   None  Assessment & Plan:   Acute hepatic encephalopathy vs. Noncongestive status epilepticus  Hyperammonemia History of seizures from alcohol withdrawal Presented with decreased conciousness with possible seizure activity. Elevated ammonia of 204. Normal ethanol levels. Loaded on Keppra 2 g.  -UDS +Benzo and THC Plan - Neurology Following  - Continue lactulose 30 mg every 8  hours - Continue EEG - Seizure percautions - IV thiamine, folate per tube  Respiratory Insuffiencey in setting of above Plan - Vent Support - Trend ABG/CXR - VAP protocol   Alcoholic hepatitis  Hyperbilirubinemia Lactic acidosis. Suspect Type B.  Presents with  elevated LFTs AST>ALT c/w alcohol use. Maddrey Score of 34 would benefit from glucocorticoids. Negative for acetaminophen, salicylate, and ethanol on admission Plan - methylprednisolone 32 mg daily, will check [place] score in 7 days to assess benefit - Trend lactate.  - Send Hepatitis   Leucocytosis, afebrile  -Patient with thick green secretions -CXR with no acute Plan -Follow Culture Data   -Trend WBC and Fever curve  -Continue vanc, cefepime, and flagyl for SBP prophylaxis   Prolonged QT Plan -hold home Seroquel and sertraline -Trend EKG  AKI Hypokalemia, Hypomagnesemia Plan -Trend BMP -Strict I/Os - Replace - Monitor electrolytes  Anemia Plan -Trend CBC. Transfuse for hemoglobin <7  HTN Plan On metoprolol tartrate 12.5 twice daily, hold for now as Bps have been soft.  Schizoaffective Disorder - Hold home Seroquel and Zoloft in setting of Qt prolongation.  Best practice (evaluated daily)  Diet: NPO Pain/Anxiety/Delirium protocol (if indicated): Fentanyl, versed, propfol VAP protocol (if indicated): Yes DVT prophylaxis: Heparin GI prophylaxis: Pantoprazole  Glucose control: None Mobility: Bed rest last date of multidisciplinary goals of care discussion N/a Family and staff present N/a Summary of discussion N/a Code Status: Full Disposition: ICU  Labs   CBC: Recent Labs  Lab 07/16/20 1536 07/16/20 1541 07/16/20 1720 07/17/20 0059  WBC  --  14.2*  --  12.1*  NEUTROABS  --  12.0*  --   --   HGB 12.9* 11.5* 12.9* 9.6*  HCT 38.0* 32.2* 38.0* 26.3*  MCV  --  88.0  --  84.8  PLT  --  156  --  130*    Basic Metabolic Panel: Recent Labs  Lab 07/16/20 1536 07/16/20 1541 07/16/20 1720 07/16/20 2100  NA 134* 136 134* 134*  K 2.3* 2.6* <2.0* <2.0*  CL 79* 80*  --  83*  CO2  --  31  --  29  GLUCOSE 131* 138*  --  135*  BUN 34* 26*  --  24*  CREATININE 1.20 1.68*  --  1.41*  CALCIUM  --  6.7*  --  6.3*  MG  --  1.3*  --   --    GFR: Estimated  Creatinine Clearance: 78.7 mL/min (A) (by C-G formula based on SCr of 1.41 mg/dL (H)). Recent Labs  Lab 07/16/20 1541 07/16/20 2100 07/17/20 0059  PROCALCITON  --  0.75  --   WBC 14.2*  --  12.1*  LATICACIDVEN 9.6* 7.7*  --     Liver Function Tests: Recent Labs  Lab 07/16/20 1541  AST 207*  ALT 68*  ALKPHOS 265*  BILITOT 21.1*  PROT 6.3*  ALBUMIN 1.8*   No results for input(s): LIPASE, AMYLASE in the last 168 hours. Recent Labs  Lab 07/16/20 1543  AMMONIA 207*    ABG    Component Value Date/Time   PHART 7.542 (H) 07/16/2020 1720   PCO2ART 47.5 07/16/2020 1720   PO2ART 358 (H) 07/16/2020 1720   HCO3 40.9 (H) 07/16/2020 1720   TCO2 42 (H) 07/16/2020 1720   O2SAT 100.0 07/16/2020 1720     Coagulation Profile: Recent Labs  Lab 07/16/20 1541 07/17/20 0059  INR 1.6* 1.5*    Cardiac Enzymes: Recent Labs  Lab 07/16/20 1541  CKTOTAL 183  HbA1C: Hgb A1c MFr Bld  Date/Time Value Ref Range Status  07/17/2020 06:50 AM 5.7 (H) 4.8 - 5.6 % Final    Comment:    (NOTE) Pre diabetes:          5.7%-6.4%  Diabetes:              >6.4%  Glycemic control for   <7.0% adults with diabetes   06/03/2018 06:39 AM 5.3 4.8 - 5.6 % Final    Comment:    (NOTE) Pre diabetes:          5.7%-6.4% Diabetes:              >6.4% Glycemic control for   <7.0% adults with diabetes     CBG: Recent Labs  Lab 07/16/20 1519 07/16/20 2036 07/16/20 2328 07/17/20 0345 07/17/20 0820  GLUCAP 132* 135* 158* 12* 152*   Past Medical History  He,  has a past medical history of Alcohol abuse, Anxiety, Depression, GERD (gastroesophageal reflux disease), Hypertension, Psychosis ([place]), PTSD (post-traumatic stress disorder), Schizoaffective disorder, Seizure disorder ([place]), and Symptomatic anemia.   Surgical History    Past Surgical History:  Procedure Laterality Date  . ESOPHAGOGASTRODUODENOSCOPY (EGD) WITH PROPOFOL N/A 05/05/2020   Procedure: ESOPHAGOGASTRODUODENOSCOPY (EGD)  WITH PROPOFOL;  Surgeon: [name], MD;  Location: [facility];  Service: Endoscopy;  Laterality: N/A;  . FOOT SURGERY     right  . HAND SURGERY       Social History   reports that he has been smoking cigarettes. He has a 45.00 pack-year smoking history. He has never used smokeless tobacco. He reports current alcohol use. He reports current drug use. Frequency: 1.00 time per week. Drug: Marijuana.   Family History   His family history includes Alcoholism in his mother; CAD in his father; Depression in his brother.   Allergies Allergies  Allergen Reactions  . Wellbutrin [Bupropion] Other (See Comments)    Caused seizures     Home Medications  Prior to Admission medications   Medication Sig Start Date End Date Taking? Authorizing Provider  allopurinol (ZYLOPRIM) 100 MG tablet Take 2 tablets (200 mg total) by mouth daily. 04/20/20   Nicolette Bang, DO  loperamide (IMODIUM) 2 MG capsule Take 4 mg by mouth as needed for diarrhea or loose stools.    [provider]  metoprolol tartrate (LOPRESSOR) 25 MG tablet Take 0.5 tablets (12.5 mg total) by mouth 2 (two) times daily. 07/11/20 08/10/20  Nicolette Bang, DO  MILK THISTLE PO Take 1 capsule by mouth daily.    [provider]  Misc. Devices MISC Blood Pressure monitor. Dx :Hypertension 06/09/19   [name], MD  Multiple Vitamin (MULTIVITAMIN WITH MINERALS) TABS tablet Take 1 tablet by mouth daily. 05/08/20   [place] Eric J, DO  pantoprazole (PROTONIX) 40 MG tablet Take 1 tablet (40 mg total) by mouth daily. 07/11/20 08/10/20  Nicolette Bang, DO  QUEtiapine (SEROQUEL) 50 MG tablet Take 1 tablet (50 mg total) by mouth at bedtime. 06/06/20 07/06/20  Nicolette Bang, DO  sertraline (ZOLOFT) 25 MG tablet Take 1 tablet (25 mg total) by mouth daily. 07/11/20   Nicolette Bang, DO   Hayden Pedro, AGACNP-BC [facility]  Pgr: [phone]   PCCM Pgr: [phone]

## 2020-07-17 NOTE — Procedures (Addendum)
Patient Name: [name]  MRN: [id number]  Epilepsy Attending: Lora Havens  Referring Physician/Provider: Dr Iona Beard Duration: 07/16/2020 1808 to 07/17/2020 1808  Patient history: 49 year old male with history of alcohol abuse, substance abuse was found down and noted to be encephalopathic on arrival.  EEG to evaluate for seizures.  Level of alertness: Comatose  AEDs during EEG study: Propofol  Technical aspects: This EEG study was done with scalp electrodes positioned according to the 10-20 International system of electrode placement. Electrical activity was acquired at a sampling rate of 500Hz  and reviewed with a high frequency filter of 70Hz  and a low frequency filter of 1Hz . EEG data were recorded continuously and digitally stored.   Description: EEG showed continuous generalized rhythmic 2 to 3 Hz delta slowing, at times with triphasic morphology.  Hyperventilation and photic stimulation were not performed.     ABNORMALITY -Continuous slow, generalized  IMPRESSION: This study is suggestive of severe diffuse encephalopathy, nonspecific etiology but could be secondary to sedation.  No seizures or epileptiform discharges were seen throughout the recording.  [name] [name]

## 2020-07-17 NOTE — Plan of Care (Signed)
Pt remains on ventilator, not following commands.

## 2020-07-17 NOTE — Progress Notes (Signed)
eLink Physician-Brief Progress Note Patient Name: Kalvyn Desa DOB: 01/24/1971 MRN: [id number]   Date of Service  07/17/2020  HPI/Events of Note  Notified of CBG 272  eICU Interventions  Ordered SSI q 4 but discussed with bedside RN to hold off giving insulin until K comes back > 3     Intervention Category Intermediate Interventions: Hyperglycemia - evaluation and treatment  Shona Needles Marijean Montanye 07/17/2020, 3:57 AM

## 2020-07-17 NOTE — Progress Notes (Addendum)
Neurology Progress Note  Chief complaint: Encephalopathy versus seizure  S:// Patient seen and examined. Has been on overnight EEG that has showed continuous generalized slowing. He has been on propofol overnight No clinical seizure activity reported  O:// Current vital signs: BP (!) 121/51   Pulse 94   Temp 98.6 F (37 C)   Resp 18   Ht 6' (1.829 m)   Wt 98.4 kg   SpO2 92%   BMI 29.42 kg/m  Vital signs in last 24 hours: Temp:  [89.2 F (31.8 C)-98.6 F (37 C)] 98.6 F (37 C) (11/30 0823) Pulse Rate:  [61-97] 94 (11/30 0823) Resp:  [13-23] 18 (11/30 0823) BP: (84-121)/(46-68) 121/51 (11/30 0800) SpO2:  [91 %-100 %] 92 % (11/30 0823) FiO2 (%):  [40 %-100 %] 40 % (11/30 0823) Weight:  [98.4 kg] 98.4 kg (11/29 1615) General: Sedated intubated HEENT: Normocephalic, prominent scleral icterus CVs: Regular rhythm Respiratory: Vented Extremities: Trace edema bilaterally Neurological exam Sedated intubated No spontaneous movements but has been breathing over the ventilator Does not open his eyes to voice Does not follow commands Cranial nerves: Pupils are equal round reactive to light, gaze is downward bilaterally with some roving movements, does not blink to threat from either side, facial symmetry difficult to ascertain due to the endotracheal tube but grossly looks symmetric. Motor exam: No spontaneous movement of the extremities but on noxious stimulation to the trapezius, attempts to localize from the left arm more than right and on noxious stimulation to the inner thigh on either side, more movement noted on the right leg than left. Sensory exam: As above   Medications  Current Facility-Administered Medications:  .  0.9 %  sodium chloride infusion, 250 mL, Intravenous, Continuous, Dewald, Jonathan B, MD .  ceFEPIme (MAXIPIME) 2 g in sodium chloride 0.9 % 100 mL IVPB, 2 g, Intravenous, Q8H, Iona Beard, MD, Last Rate: 200 mL/hr at 07/17/20 0550, 2 g at 07/17/20  0550 .  chlorhexidine gluconate (MEDLINE KIT) (PERIDEX) 0.12 % solution 15 mL, 15 mL, Mouth Rinse, BID, Freddi Starr, MD, 15 mL at 07/17/20 0843 .  Chlorhexidine Gluconate Cloth 2 % PADS 6 each, 6 each, Topical, Daily, Dewald, Jonathan B, MD .  docusate (COLACE) 50 MG/5ML liquid 100 mg, 100 mg, Per Tube, BID, Iona Beard, MD, 100 mg at 07/16/20 2100 .  fentaNYL (SUBLIMAZE) injection 50 mcg, 50 mcg, Intravenous, Q15 min PRN, Iona Beard, MD .  fentaNYL (SUBLIMAZE) injection 50-200 mcg, 50-200 mcg, Intravenous, Q30 min PRN, Iona Beard, MD, 100 mcg at 07/17/20 0129 .  folic acid (FOLVITE) tablet 1 mg, 1 mg, Per Tube, Daily, Iona Beard, MD, 1 mg at 07/17/20 0939 .  heparin injection 5,000 Units, 5,000 Units, Subcutaneous, Q8H, Iona Beard, MD, 5,000 Units at 07/17/20 0550 .  insulin aspart (novoLOG) injection 0-15 Units, 0-15 Units, Subcutaneous, TID WC, Judd Lien, MD, 3 Units at 07/17/20 0841 .  lactated ringers infusion, , Intravenous, Continuous, Iona Beard, MD, Stopped at 07/17/20 [phone]  lactulose (CHRONULAC) 10 GM/15ML solution 30 g, 30 g, Per Tube, TID, Iona Beard, MD, 30 g at 07/17/20 0939 .  MEDLINE mouth rinse, 15 mL, Mouth Rinse, 10 times per day, Freddi Starr, MD, 15 mL at 07/17/20 0942 .  methylPREDNISolone sodium succinate (SOLU-MEDROL) 40 mg/mL injection 32 mg, 32 mg, Intravenous, Daily, Freda Jackson B, MD, 32 mg at 07/17/20 0935 .  midazolam (VERSED) 50 mg/50 mL (1 mg/mL) premix infusion, 0.5-10 mg/hr, Intravenous, Continuous, Iona Beard, MD,  Held at 07/16/20 2104 .  norepinephrine (LEVOPHED) 38m in 2558mpremix infusion, 2-10 mcg/min, Intravenous, Titrated, DeFreda Jackson, MD, Last Rate: 18.75 mL/hr at 07/17/20 0400, 5 mcg/min at 07/17/20 0400 .  pantoprazole (PROTONIX) injection 40 mg, 40 mg, Intravenous, Daily, LiIona BeardMD, 40 mg at 07/17/20 0934 .  polyethylene glycol (MIRALAX / GLYCOLAX) packet 17 g, 17 g, Per Tube,  Daily, LiIona BeardMD, 17 g at 07/16/20 2000 .  propofol (DIPRIVAN) 1000 MG/100ML infusion, 5-80 mcg/kg/min, Intravenous, Continuous, LiIona BeardMD, Last Rate: 11.81 mL/hr at 07/17/20 0505, 20 mcg/kg/min at 07/17/20 0505 .  sodium chloride flush (NS) 0.9 % injection 3 mL, 3 mL, Intravenous, Q12H, LiIona BeardMD, 3 mL at 07/17/20 0939 .  thiamine (B-1) injection 100 mg, 100 mg, Intravenous, Daily, LiIona BeardMD, 100 mg at 07/17/20 0935 .  vancomycin (VANCOCIN) IVPB 1000 mg/200 mL premix, 1,000 mg, Intravenous, Q12H, LiIona BeardMD, Last Rate: 200 mL/hr at 07/17/20 0626, 1,000 mg at 07/17/20 0626 .  vancomycin (VANCOREADY) IVPB 2000 mg/400 mL, 2,000 mg, Intravenous, Once, LiIona BeardMD Labs CBC    Component Value Date/Time   WBC 12.1 (H) 07/17/2020 0059   RBC 3.10 (L) 07/17/2020 0059   HGB 9.6 (L) 07/17/2020 0059   HGB 12.5 (L) 01/25/2019 1404   HCT 26.3 (L) 07/17/2020 0059   HCT 37.0 (L) 01/25/2019 1404   PLT 130 (L) 07/17/2020 0059   PLT 571 (H) 01/25/2019 1404   MCV 84.8 07/17/2020 0059   MCV 97 01/25/2019 1404   MCH 31.0 07/17/2020 0059   MCHC 36.5 (H) 07/17/2020 0059   RDW 19.2 (H) 07/17/2020 0059   RDW 13.6 01/25/2019 1404   LYMPHSABS 1.3 07/16/2020 1541   LYMPHSABS 2.6 01/25/2019 1404   MONOABS 0.8 07/16/2020 1541   EOSABS 0.0 07/16/2020 1541   EOSABS 0.1 01/25/2019 1404   BASOSABS 0.0 07/16/2020 1541   BASOSABS 0.1 01/25/2019 1404    CMP     Component Value Date/Time   NA 134 (L) 07/16/2020 2100   NA 140 05/28/2020 1630   K <2.0 (LL) 07/16/2020 2100   CL 83 (L) 07/16/2020 2100   CO2 29 07/16/2020 2100   GLUCOSE 135 (H) 07/16/2020 2100   BUN 24 (H) 07/16/2020 2100   BUN 6 05/28/2020 1630   CREATININE 1.41 (H) 07/16/2020 2100   CREATININE 0.93 09/13/2015 0946   CALCIUM 6.3 (LL) 07/16/2020 2100   PROT 6.3 (L) 07/16/2020 1541   PROT 6.1 11/09/2019 1335   ALBUMIN 1.8 (L) 07/16/2020 1541   ALBUMIN 3.2 (L) 11/09/2019 1335   AST 207 (H)  07/16/2020 1541   ALT 68 (H) 07/16/2020 1541   ALKPHOS 265 (H) 07/16/2020 1541   BILITOT 21.1 (HH) 07/16/2020 1541   BILITOT 6.6 (H) 11/09/2019 1335   GFRNONAA >60 07/16/2020 2100   GFRNONAA >89 09/13/2015 0946   GFRAA 137 05/28/2020 1630   GFRAA >89 09/13/2015 0946  Ammonia level 207 yesterday  Imaging I have reviewed images in epic and the results pertinent to this consultation are: CT-scan of the brain on arrival with no acute findings.  Assessment:  [age]ear old past medical history of alcohol abuse, alcoholic liver cirrhosis, hypertension, polysubstance abuse, PTSD, depression, anxiety found down by police after they were notified for a welfare check-extremely encephalopathic on arrival and on scene and intubated in the ED with the use of paralytic-initial neurologic exam yesterday with a GCS of 3.  Patient also has a known history of withdrawal seizures and possibly  noncompliance and there was concern for seizure activity in the ED for which neurological consultation was obtained.  Today, he remains on propofol but had intact brainstem function and some questionable withdrawal to noxious stimulation versus localization in the left upper extremity to noxious stimulation. Overnight EEG with severe diffuse encephalopathy.  Continuous generalized slowing.  No focality on EEG.   Impression: Toxic metabolic encephalopathy   Recommendations: -Attempt to lower sedation -Continue EEG for now until sedation is completely discontinued. -Continued medical management of liver disease per PCCM -Check ammonia level again and use lactulose till normalized. -He got a loading dose of Keppra yesterday.  No seizures on EEG.  Will hold off on AEDs-has a questionable history of withdrawal seizures but not been on any AEDs before.  EEG also not suggestive of ongoing seizures. Discussed with Dr. Lynetta Mare on the unit. -- Amie Portland, MD [facility] Pager: [phone] If 7pm to 7am,  please call on call as listed on AMION.  CRITICAL CARE ATTESTATION Performed by: Amie Portland, MD Total critical care time: 40 minutes Critical care time was exclusive of separately billable procedures and treating other patients and/or supervising APPs/Residents/Students Critical care was necessary to treat or prevent imminent or life-threatening deterioration due to multifactorial toxic metabolic encephalopathy and concern for seizures This patient is critically ill and at significant risk for neurological worsening and/or death and care requires constant monitoring. Critical care was time spent personally by me on the following activities: development of treatment plan with patient and/or surrogate as well as nursing, discussions with consultants, evaluation of patient's response to treatment, examination of patient, obtaining history from patient or surrogate, ordering and performing treatments and interventions, ordering and review of laboratory studies, ordering and review of radiographic studies, pulse oximetry, re-evaluation of patient's condition, participation in multidisciplinary rounds and medical decision making of high complexity in the care of this patient.

## 2020-07-17 NOTE — Procedures (Addendum)
Patient Name: Bertha Earwood  MRN: [id number]  Epilepsy Attending: Lora Havens  Referring Physician/Provider: Dr Donnetta Simpers Date: 07/16/2020 Duration: 23.45 mins  Patient history: 49 year old male with history of alcohol abuse, substance abuse was found down and noted to be encephalopathic on arrival.  EEG to evaluate for seizures.  Level of alertness: Comatose  AEDs during EEG study: Propofol  Technical aspects: This EEG study was done with scalp electrodes positioned according to the 10-20 International system of electrode placement. Electrical activity was acquired at a sampling rate of 500Hz  and reviewed with a high frequency filter of 70Hz  and a low frequency filter of 1Hz . EEG data were recorded continuously and digitally stored.   Description: EEG showed continuous generalized rhythmic 2 to 3 Hz delta slowing.  Hyperventilation and photic stimulation were not performed.     ABNORMALITY -Continuous slow, generalized  IMPRESSION: This study is suggestive of severe diffuse encephalopathy, nonspecific etiology but could be secondary to sedation.  No seizures or epileptiform discharges were seen throughout the recording.  Sereniti Wan [name]

## 2020-07-17 NOTE — Progress Notes (Signed)
LTM maint complete - no skin breakdown under: FP2,F8,F4.event button test

## 2020-07-18 ENCOUNTER — Inpatient Hospital Stay (HOSPITAL_COMMUNITY): Payer: Medicare Other

## 2020-07-18 DIAGNOSIS — K7290 Hepatic failure, unspecified without coma: Secondary | ICD-10-CM | POA: Diagnosis not present

## 2020-07-18 LAB — GLUCOSE, CAPILLARY
Glucose-Capillary: 126 mg/dL — ABNORMAL HIGH (ref 70–99)
Glucose-Capillary: 151 mg/dL — ABNORMAL HIGH (ref 70–99)
Glucose-Capillary: 161 mg/dL — ABNORMAL HIGH (ref 70–99)
Glucose-Capillary: 170 mg/dL — ABNORMAL HIGH (ref 70–99)
Glucose-Capillary: 171 mg/dL — ABNORMAL HIGH (ref 70–99)
Glucose-Capillary: 178 mg/dL — ABNORMAL HIGH (ref 70–99)

## 2020-07-18 LAB — BASIC METABOLIC PANEL
Anion gap: 18 — ABNORMAL HIGH (ref 5–15)
Anion gap: 16 — ABNORMAL HIGH (ref 5–15)
BUN: 25 mg/dL — ABNORMAL HIGH (ref 6–20)
BUN: 25 mg/dL — ABNORMAL HIGH (ref 6–20)
CO2: 27 mmol/L (ref 22–32)
CO2: 27 mmol/L (ref 22–32)
Calcium: 6.6 mg/dL — ABNORMAL LOW (ref 8.9–10.3)
Calcium: 6.8 mg/dL — ABNORMAL LOW (ref 8.9–10.3)
Chloride: 94 mmol/L — ABNORMAL LOW (ref 98–111)
Chloride: 99 mmol/L (ref 98–111)
Creatinine, Ser: 1.61 mg/dL — ABNORMAL HIGH (ref 0.61–1.24)
Creatinine, Ser: 1.69 mg/dL — ABNORMAL HIGH (ref 0.61–1.24)
GFR, Estimated: 53 mL/min — ABNORMAL LOW (ref >60)
GFR, Estimated: 50 mL/min — ABNORMAL LOW (ref >60)
Glucose, Bld: 135 mg/dL — ABNORMAL HIGH (ref 70–99)
Glucose, Bld: 161 mg/dL — ABNORMAL HIGH (ref 70–99)
Potassium: 2.8 mmol/L — ABNORMAL LOW (ref 3.5–5.1)
Potassium: 2.7 mmol/L — CL (ref 3.5–5.1)
Sodium: 139 mmol/L (ref 135–145)
Sodium: 142 mmol/L (ref 135–145)

## 2020-07-18 LAB — CBC
HCT: 21.7 % — ABNORMAL LOW (ref 39.0–52.0)
Hemoglobin: 7.9 g/dL — ABNORMAL LOW (ref 13.0–17.0)
MCH: 31.2 pg (ref 26.0–34.0)
MCHC: 36.4 g/dL — ABNORMAL HIGH (ref 30.0–36.0)
MCV: 85.8 fL (ref 80.0–100.0)
Platelets: 123 10*3/uL — ABNORMAL LOW (ref 150–400)
RBC: 2.53 MIL/uL — ABNORMAL LOW (ref 4.22–5.81)
RDW: 20.4 % — ABNORMAL HIGH (ref 11.5–15.5)
WBC: 15 10*3/uL — ABNORMAL HIGH (ref 4.0–10.5)
nRBC: 0 % (ref 0.0–0.2)

## 2020-07-18 LAB — HEPATIC FUNCTION PANEL
ALT: 39 U/L (ref 0–44)
AST: 130 U/L — ABNORMAL HIGH (ref 15–41)
Albumin: 2.4 g/dL — ABNORMAL LOW (ref 3.5–5.0)
Alkaline Phosphatase: 153 U/L — ABNORMAL HIGH (ref 38–126)
Bilirubin, Direct: 9.9 mg/dL — ABNORMAL HIGH (ref 0.0–0.2)
Indirect Bilirubin: 7.1 mg/dL — ABNORMAL HIGH (ref 0.3–0.9)
Total Bilirubin: 17 mg/dL — ABNORMAL HIGH (ref 0.3–1.2)
Total Protein: 5.5 g/dL — ABNORMAL LOW (ref 6.5–8.1)

## 2020-07-18 LAB — MAGNESIUM
Magnesium: 1.6 mg/dL — ABNORMAL LOW (ref 1.7–2.4)
Magnesium: 2.2 mg/dL (ref 1.7–2.4)

## 2020-07-18 LAB — PROCALCITONIN: Procalcitonin: 1.54 ng/mL

## 2020-07-18 LAB — PHOSPHORUS
Phosphorus: 2.7 mg/dL (ref 2.5–4.6)
Phosphorus: 1.9 mg/dL — ABNORMAL LOW (ref 2.5–4.6)

## 2020-07-18 LAB — VITAMIN D 25 HYDROXY (VIT D DEFICIENCY, FRACTURES): Vit D, 25-Hydroxy: 11.5 ng/mL — ABNORMAL LOW (ref 30–100)

## 2020-07-18 LAB — LACTIC ACID, PLASMA: Lactic Acid, Venous: 2.5 mmol/L (ref 0.5–1.9)

## 2020-07-18 MED ORDER — SODIUM CHLORIDE 0.9% FLUSH
10.0000 mL | Freq: Two times a day (BID) | INTRAVENOUS | Status: DC
  Administered 2020-07-18 – 2020-07-19 (×4): 10 mL
  Administered 2020-07-20: 40 mL
  Administered 2020-07-20: 20 mL
  Administered 2020-07-21 – 2020-07-27 (×13): 10 mL
  Administered 2020-07-28: 20 mL
  Administered 2020-07-28 – 2020-07-29 (×3): 10 mL
  Administered 2020-07-30: 20 mL
  Administered 2020-07-30 – 2020-08-01 (×5): 10 mL
  Administered 2020-08-02 – 2020-08-03 (×3): 20 mL
  Administered 2020-08-03 – 2020-08-04 (×2): 10 mL
  Administered 2020-08-04: 20 mL
  Administered 2020-08-05: 40 mL
  Administered 2020-08-05 – 2020-08-06 (×2): 10 mL

## 2020-07-18 MED ORDER — PROSOURCE TF PO LIQD
90.0000 mL | Freq: Three times a day (TID) | ORAL | Status: DC
  Administered 2020-07-18 – 2020-07-24 (×18): 90 mL
  Filled 2020-07-18 (×18): qty 90

## 2020-07-18 MED ORDER — POTASSIUM CHLORIDE 10 MEQ/50ML IV SOLN
10.0000 meq | INTRAVENOUS | Status: AC
  Administered 2020-07-18 (×4): 10 meq via INTRAVENOUS
  Filled 2020-07-18 (×4): qty 50

## 2020-07-18 MED ORDER — CALCIUM GLUCONATE-NACL 1-0.675 GM/50ML-% IV SOLN: 1.0000 g | Freq: Once | INTRAVENOUS | Status: DC

## 2020-07-18 MED ORDER — ALBUMIN HUMAN 25 % IV SOLN
25.0000 g | Freq: Four times a day (QID) | INTRAVENOUS | Status: AC
  Administered 2020-07-18 – 2020-07-19 (×3): 25 g via INTRAVENOUS
  Filled 2020-07-18 (×3): qty 100

## 2020-07-18 MED ORDER — POTASSIUM CHLORIDE 20 MEQ/15ML (10%) PO SOLN
30.0000 meq | ORAL | Status: AC
  Administered 2020-07-18 (×2): 30 meq
  Filled 2020-07-18 (×2): qty 30

## 2020-07-18 MED ORDER — MAGNESIUM SULFATE 2 GM/50ML IV SOLN
2.0000 g | Freq: Once | INTRAVENOUS | Status: AC
  Administered 2020-07-18: 2 g via INTRAVENOUS
  Filled 2020-07-18: qty 50

## 2020-07-18 MED ORDER — SODIUM CHLORIDE 0.9 % IV SOLN
1.0000 g | Freq: Once | INTRAVENOUS | Status: AC
  Administered 2020-07-18: 1 g via INTRAVENOUS
  Filled 2020-07-18: qty 10

## 2020-07-18 MED ORDER — POTASSIUM CHLORIDE 10 MEQ/50ML IV SOLN
10.0000 meq | INTRAVENOUS | Status: AC
  Administered 2020-07-18 (×4): 10 meq via INTRAVENOUS
  Filled 2020-07-18 (×3): qty 50

## 2020-07-18 MED ORDER — POTASSIUM PHOSPHATES 15 MMOLE/5ML IV SOLN
30.0000 mmol | Freq: Once | INTRAVENOUS | Status: AC
  Administered 2020-07-18: 30 mmol via INTRAVENOUS
  Filled 2020-07-18: qty 10

## 2020-07-18 MED ORDER — ADULT MULTIVITAMIN W/MINERALS CH
1.0000 | ORAL_TABLET | Freq: Every day | ORAL | Status: DC
  Administered 2020-07-18 – 2020-07-24 (×7): 1
  Filled 2020-07-18 (×7): qty 1

## 2020-07-18 MED ORDER — SODIUM CHLORIDE 0.9% FLUSH
10.0000 mL | INTRAVENOUS | Status: DC | PRN
  Administered 2020-07-25 – 2020-07-26 (×2): 10 mL

## 2020-07-18 MED ORDER — VITAL 1.5 CAL PO LIQD
1000.0000 mL | ORAL | Status: DC
  Administered 2020-07-18: 1000 mL
  Filled 2020-07-18 (×2): qty 1000

## 2020-07-18 MED ORDER — NOREPINEPHRINE 4 MG/250ML-% IV SOLN
0.0000 ug/min | INTRAVENOUS | Status: DC
  Administered 2020-07-19: 10 ug/min via INTRAVENOUS
  Administered 2020-07-19: 6 ug/min via INTRAVENOUS
  Administered 2020-07-20: 10 ug/min via INTRAVENOUS
  Administered 2020-07-20: 7 ug/min via INTRAVENOUS
  Administered 2020-07-21: 5 ug/min via INTRAVENOUS
  Administered 2020-07-21: 7 ug/min via INTRAVENOUS
  Administered 2020-07-22: 4 ug/min via INTRAVENOUS
  Administered 2020-07-23: 3 ug/min via INTRAVENOUS
  Administered 2020-07-25: 2 ug/min via INTRAVENOUS
  Filled 2020-07-18 (×12): qty 250

## 2020-07-18 MED ORDER — NOREPINEPHRINE 4 MG/250ML-% IV SOLN
INTRAVENOUS | Status: AC
  Administered 2020-07-18: 2 ug/min via INTRAVENOUS
  Filled 2020-07-18: qty 250

## 2020-07-18 MED ORDER — POTASSIUM CHLORIDE 20 MEQ PO PACK
40.0000 meq | PACK | ORAL | Status: AC
  Administered 2020-07-18 (×2): 40 meq
  Filled 2020-07-18 (×2): qty 2

## 2020-07-18 MED ORDER — DEXMEDETOMIDINE HCL IN NACL 400 MCG/100ML IV SOLN
0.4000 ug/kg/h | INTRAVENOUS | Status: DC
  Administered 2020-07-18: 0.4 ug/kg/h via INTRAVENOUS
  Administered 2020-07-18: 1 ug/kg/h via INTRAVENOUS
  Administered 2020-07-18: 0.5 ug/kg/h via INTRAVENOUS
  Administered 2020-07-19: 1 ug/kg/h via INTRAVENOUS
  Administered 2020-07-19: 0.8 ug/kg/h via INTRAVENOUS
  Administered 2020-07-19: 1 ug/kg/h via INTRAVENOUS
  Administered 2020-07-20: 1.2 ug/kg/h via INTRAVENOUS
  Administered 2020-07-20: 1 ug/kg/h via INTRAVENOUS
  Administered 2020-07-20 (×2): 1.199 ug/kg/h via INTRAVENOUS
  Administered 2020-07-20: 0.4 ug/kg/h via INTRAVENOUS
  Administered 2020-07-21 (×2): 0.8 ug/kg/h via INTRAVENOUS
  Administered 2020-07-21: 0.7 ug/kg/h via INTRAVENOUS
  Administered 2020-07-21 (×2): 1.199 ug/kg/h via INTRAVENOUS
  Administered 2020-07-22: 0.6 ug/kg/h via INTRAVENOUS
  Administered 2020-07-22 (×2): 0.7 ug/kg/h via INTRAVENOUS
  Administered 2020-07-22: 1 ug/kg/h via INTRAVENOUS
  Administered 2020-07-23: 0.7 ug/kg/h via INTRAVENOUS
  Administered 2020-07-23: 0.5 ug/kg/h via INTRAVENOUS
  Administered 2020-07-23: 0.6 ug/kg/h via INTRAVENOUS
  Administered 2020-07-24: 0.8 ug/kg/h via INTRAVENOUS
  Filled 2020-07-18 (×26): qty 100

## 2020-07-18 NOTE — Progress Notes (Signed)
Neurology Progress Note  Chief complaint: Encephalopathy versus seizure  S:// Patient seen and examined. Has been on overnight EEG that has showed continuous generalized slowing. He has been on propofol overnight, which the primary team is transitioning to Precedex today. No clinical seizure activity reported  O:// Current vital signs: BP 107/63   Pulse 88   Temp (!) 97.3 F (36.3 C)   Resp (!) 23   Ht 6' (1.829 m)   Wt 98.4 kg   SpO2 (!) 88%   BMI 29.42 kg/m  Vital signs in last 24 hours: Temp:  [96.8 F (36 C)-98.8 F (37.1 C)] 97.3 F (36.3 C) (12/01 1000) Pulse Rate:  [82-96] 88 (12/01 1000) Resp:  [14-23] 23 (12/01 1000) BP: (88-122)/(45-70) 107/63 (12/01 1000) SpO2:  [88 %-97 %] 88 % (12/01 1000) FiO2 (%):  [40 %] 40 % (12/01 0733) General: Sedated intubated HEENT: Normocephalic, prominent scleral icterus CVs: Regular rhythm Respiratory: Vented Extremities: Trace edema bilaterally Neurological exam Sedated intubated Opens eyes to voice.  Tries to track. He does not follow any commands Upon opening eyes, moves all 4 extremities without any obvious noticeable focal difference. Cranial nerves: Pupils equal round reactive to light, extraocular movement examination shows roving eye movements, resolving to threat from either side, facial symmetry difficult to ascertain because of the tube but grossly symmetric. Motor examination: Moving all fours with equal strength but not to command Sensory exam: Withdraws in all fours to noxious stimulation Coordination cannot be assessed Medications  Current Facility-Administered Medications:  .  0.9 %  sodium chloride infusion, , Intravenous, PRN, Kipp Brood, MD, Stopped at 07/17/20 1958 .  ceFEPIme (MAXIPIME) 2 g in sodium chloride 0.9 % 100 mL IVPB, 2 g, Intravenous, Q8H, Iona Beard, MD, Stopped at 07/18/20 [phone] .  chlorhexidine gluconate (MEDLINE KIT) (PERIDEX) 0.12 % solution 15 mL, 15 mL, Mouth Rinse, BID, Freddi Starr, MD, 15 mL at 07/18/20 0820 .  Chlorhexidine Gluconate Cloth 2 % PADS 6 each, 6 each, Topical, Daily, Freddi Starr, MD, 6 each at 07/17/20 1145 .  dexmedetomidine (PRECEDEX) 400 MCG/100ML (4 mcg/mL) infusion, 0.4-1.2 mcg/kg/hr, Intravenous, Titrated, Eubanks, Katalina M, NP .  docusate (COLACE) 50 MG/5ML liquid 100 mg, 100 mg, Per Tube, BID, Iona Beard, MD, 100 mg at 07/17/20 2153 .  feeding supplement (PROSource TF) liquid 90 mL, 90 mL, Per Tube, TID, Agarwala, Ravi, MD .  feeding supplement (VITAL 1.5 CAL) liquid 1,000 mL, 1,000 mL, Per Tube, Continuous, Agarwala, Ravi, MD .  fentaNYL (SUBLIMAZE) injection 50 mcg, 50 mcg, Intravenous, Q15 min PRN, Iona Beard, MD .  fentaNYL (SUBLIMAZE) injection 50-200 mcg, 50-200 mcg, Intravenous, Q30 min PRN, Iona Beard, MD, 100 mcg at 07/17/20 0129 .  folic acid (FOLVITE) tablet 1 mg, 1 mg, Per Tube, Daily, Iona Beard, MD, 1 mg at 07/17/20 0939 .  heparin injection 5,000 Units, 5,000 Units, Subcutaneous, Q8H, Iona Beard, MD, 5,000 Units at 07/18/20 [phone] .  insulin aspart (novoLOG) injection 0-15 Units, 0-15 Units, Subcutaneous, Q4H, Kipp Brood, MD, 3 Units at 07/18/20 0845 .  lactulose (CHRONULAC) 10 GM/15ML solution 30 g, 30 g, Per Tube, TID, Iona Beard, MD, 30 g at 07/17/20 2153 .  MEDLINE mouth rinse, 15 mL, Mouth Rinse, 10 times per day, Freddi Starr, MD, 15 mL at 07/17/20 2330 .  methylPREDNISolone sodium succinate (SOLU-MEDROL) 40 mg/mL injection 32 mg, 32 mg, Intravenous, Daily, Freda Jackson B, MD, 32 mg at 07/17/20 0935 .  multivitamin with minerals tablet 1 tablet,  1 tablet, Per Tube, Daily, Agarwala, Ravi, MD .  pantoprazole (PROTONIX) injection 40 mg, 40 mg, Intravenous, Daily, Iona Beard, MD, 40 mg at 07/17/20 0934 .  polyethylene glycol (MIRALAX / GLYCOLAX) packet 17 g, 17 g, Per Tube, Daily, Iona Beard, MD, 17 g at 07/16/20 2000 .  potassium chloride 10 mEq in 50 mL *CENTRAL LINE*  IVPB, 10 mEq, Intravenous, Q1 Hr x 4, Eubanks, Katalina M, NP .  potassium chloride 20 MEQ/15ML (10%) solution 30 mEq, 30 mEq, Per Tube, Q4H, Anders Simmonds, MD, 30 mEq at 07/18/20 0819 .  sodium chloride flush (NS) 0.9 % injection 10-40 mL, 10-40 mL, Intracatheter, Q12H, Agarwala, Ravi, MD .  sodium chloride flush (NS) 0.9 % injection 10-40 mL, 10-40 mL, Intracatheter, PRN, Agarwala, Ravi, MD .  sodium chloride flush (NS) 0.9 % injection 3 mL, 3 mL, Intravenous, Q12H, Iona Beard, MD, 3 mL at 07/17/20 2154 .  thiamine (B-1) injection 100 mg, 100 mg, Intravenous, Daily, Iona Beard, MD, 100 mg at 07/17/20 0935 Labs CBC    Component Value Date/Time   WBC 15.0 (H) 07/18/2020 0341   RBC 2.53 (L) 07/18/2020 0341   HGB 7.9 (L) 07/18/2020 0341   HGB 12.5 (L) 01/25/2019 1404   HCT 21.7 (L) 07/18/2020 0341   HCT 37.0 (L) 01/25/2019 1404   PLT 123 (L) 07/18/2020 0341   PLT 571 (H) 01/25/2019 1404   MCV 85.8 07/18/2020 0341   MCV 97 01/25/2019 1404   MCH 31.2 07/18/2020 0341   MCHC 36.4 (H) 07/18/2020 0341   RDW 20.4 (H) 07/18/2020 0341   RDW 13.6 01/25/2019 1404   LYMPHSABS 1.3 07/16/2020 1541   LYMPHSABS 2.6 01/25/2019 1404   MONOABS 0.8 07/16/2020 1541   EOSABS 0.0 07/16/2020 1541   EOSABS 0.1 01/25/2019 1404   BASOSABS 0.0 07/16/2020 1541   BASOSABS 0.1 01/25/2019 1404    CMP     Component Value Date/Time   NA 139 07/18/2020 0341   NA 140 05/28/2020 1630   K 2.8 (L) 07/18/2020 0341   CL 94 (L) 07/18/2020 0341   CO2 27 07/18/2020 0341   GLUCOSE 135 (H) 07/18/2020 0341   BUN 25 (H) 07/18/2020 0341   BUN 6 05/28/2020 1630   CREATININE 1.61 (H) 07/18/2020 0341   CREATININE 0.93 09/13/2015 0946   CALCIUM 6.6 (L) 07/18/2020 0341   PROT 5.5 (L) 07/18/2020 0341   PROT 6.1 11/09/2019 1335   ALBUMIN 2.4 (L) 07/18/2020 0341   ALBUMIN 3.2 (L) 11/09/2019 1335   AST 130 (H) 07/18/2020 0341   ALT 39 07/18/2020 0341   ALKPHOS 153 (H) 07/18/2020 0341   BILITOT 17.0 (H)  07/18/2020 0341   BILITOT 6.6 (H) 11/09/2019 1335   GFRNONAA 53 (L) 07/18/2020 0341   GFRNONAA >89 09/13/2015 0946   GFRAA 137 05/28/2020 1630   GFRAA >89 09/13/2015 0946  Ammonia level 207 yesterday  Imaging I have reviewed images in epic and the results pertinent to this consultation are: CT-scan of the brain on arrival with no acute findings.  Assessment:  49 year old past medical history of alcohol abuse, alcoholic liver cirrhosis, hypertension, polysubstance abuse, PTSD, depression, anxiety found down by police after they were notified for a welfare check-extremely encephalopathic on arrival and on scene and intubated in the ED with the use of paralytic-initial neurologic exam yesterday with a GCS of 3.  Patient also has a known history of withdrawal seizures and possibly noncompliance and there was concern for seizure activity in the ED for  which neurological consultation was obtained.  Today, exam is better than yesterday with spontaneous movement of all 4 extremities without obvious focal weakness. Overnight EEG with severe diffuse encephalopathy.  Continuous generalized slowing.  No focality on EEG.   Impression: Toxic metabolic encephalopathy   Recommendations: -Attempt to lower sedation-plan is to convert to Precedex today.  If remains seizure-free 1 or 2 hours after switching to Precedex, can dc continuous EEG or can continue until tomorrow morning and DC EEG then if remains stable. -Continued medical management of liver disease per PCCM -Check ammonia level again and use lactulose till normalized. -Continue to hold off on antiepileptics. Discussed with Dr. Lynetta Mare on the unit.  Communicated with Dr. Hortense Ramal regarding the EEG results and plan. -- Amie Portland, MD [facility] Pager: [phone] If 7pm to 7am, please call on call as listed on AMION.  CRITICAL CARE ATTESTATION Performed by: Amie Portland, MD Total critical care time: 30 minutes Critical care  time was exclusive of separately billable procedures and treating other patients and/or supervising APPs/Residents/Students Critical care was necessary to treat or prevent imminent or life-threatening deterioration due to multifactorial toxic metabolic encephalopathy and concern for seizures This patient is critically ill and at significant risk for neurological worsening and/or death and care requires constant monitoring. Critical care was time spent personally by me on the following activities: development of treatment plan with patient and/or surrogate as well as nursing, discussions with consultants, evaluation of patient's response to treatment, examination of patient, obtaining history from patient or surrogate, ordering and performing treatments and interventions, ordering and review of laboratory studies, ordering and review of radiographic studies, pulse oximetry, re-evaluation of patient's condition, participation in multidisciplinary rounds and medical decision making of high complexity in the care of this patient.

## 2020-07-18 NOTE — Progress Notes (Addendum)
CRITICAL VALUE ALERT  Critical Value:  K+ 2.7  Date & Time Notied:  07/18/20 @ 1645  Provider Notified: [id number]  Orders Received/Actions taken: IV and tube supplementation ordered

## 2020-07-18 NOTE — Progress Notes (Signed)
Pt placed back on full ventilator support for night time rest. 

## 2020-07-18 NOTE — Progress Notes (Signed)
vLTM EEG complete. No skin breakdown 

## 2020-07-18 NOTE — Procedures (Addendum)
Patient Name:Stevens Grieser RUE:[id number] Epilepsy Attending:Jayna Mulnix [name] Referring Physician/Provider:Dr Iona Beard Duration:07/17/2020 1478 to 07/18/2020 1623  Patient history:49 year old male with history of alcohol abuse, substance abuse was found down and noted to be encephalopathic on arrival. EEG to evaluate for seizures.  Level of alertness:Comatose  AEDs during EEG study:Propofol  Technical aspects: This EEG study was done with scalp electrodes positioned according to the 10-20 International system of electrode placement. Electrical activity was acquired at a sampling rate of 500Hz  and reviewed with a high frequency filter of 70Hz  and a low frequency filter of 1Hz . EEG data were recorded continuously and digitally stored.   Description:EEG showed  continuous generalized predominantly 5 to 6 Hz theta slowing as well as intermittent generalized 2 to 3 Hz delta slowing, at times with triphasic morphology. Hyperventilation and photic stimulation were not performed.   ABNORMALITY -Continuous slow, generalized  IMPRESSION: This study issuggestive of severe diffuse encephalopathy, nonspecific etiology but could be secondary to sedation, toxic-metabolic etiology. No seizures or epileptiform discharges were seen throughout the recording.  EEG appears to be improving compared to previous day.   Morayma Godown [name]

## 2020-07-18 NOTE — Progress Notes (Signed)
[facility] Progress Note Patient Name: Kensley Lares DOB: 1970/09/09 MRN: [id number]   Date of Service  07/18/2020  HPI/Events of Note  Hypokalemia - K+ = 2.8 and Creatinine = 1.61 and Hypomagnesemia - Mg++ = 1.6.   eICU Interventions  Will replace K+ and Mg++.     Intervention Category Major Interventions: Electrolyte abnormality - evaluation and management  Annice Jolly Eugene 07/18/2020, 6:24 AM

## 2020-07-18 NOTE — Progress Notes (Signed)
Peripherally Inserted Central Catheter Placement  The IV Nurse has discussed with the patient and/or persons authorized to consent for the patient, the purpose of this procedure and the potential benefits and risks involved with this procedure.  The benefits include less needle sticks, lab draws from the catheter, and the patient may be discharged home with the catheter. Risks include, but not limited to, infection, bleeding, blood clot (thrombus formation), and puncture of an artery; nerve damage and irregular heartbeat and possibility to perform a PICC exchange if needed/ordered by physician.  Alternatives to this procedure were also discussed.  Bard Power PICC patient education guide, fact sheet on infection prevention and patient information card has been provided to patient /or left at bedside.  Consent obtained via telephone with Sister   PICC Placement Documentation  PICC Double Lumen 07/18/20 PICC Right Brachial 40 cm 2 cm (Active)  Indication for Insertion or Continuance of Line Prolonged intravenous therapies 07/18/20 0900  Exposed Catheter (cm) 2 cm 07/18/20 0900  Site Assessment Clean;Dry;Intact 07/18/20 0900  Lumen #1 Status Flushed;Saline locked;Blood return noted 07/18/20 0900  Lumen #2 Status Flushed;Saline locked;Blood return noted 07/18/20 0900  Dressing Type Transparent;Securing device 07/18/20 0900  Dressing Status Clean;Dry;Intact 07/18/20 0900  Antimicrobial disc in place? Yes 07/18/20 0900  Safety Lock Not Applicable 12/45/80 9983  Line Care Connections checked and tightened 07/18/20 0900  Dressing Change Due 07/25/20 07/18/20 0900       Holley Bouche Renee 07/18/2020, 9:34 AM

## 2020-07-18 NOTE — Progress Notes (Signed)
Initial Nutrition Assessment  DOCUMENTATION CODES:   Not applicable  INTERVENTION:   Tube feeding via OG tube: Vital 1.5 infusing @ 20 ml/hr  Increase by 10 ml every 8 hours to goal rate of 55 ml/h (1320 ml per day) Prosource TF 90 ml TID MVI with minerals  Provides 2220 kcal, 155 gm protein, 1003 ml free water daily  Monitor magnesium and phosphorus every 12 hours x 4 occurances, MD to replete as needed, as pt is at risk for refeeding syndrome given history of severe ETOH abuse/liver disease.  Check vitamin labs: Vitamin A Vitamin B6 Vitamin C Vitamin D  NUTRITION DIAGNOSIS:   Increased nutrient needs related to catabolic illness as evidenced by estimated needs.  GOAL:   Patient will meet greater than or equal to 90% of their needs  MONITOR:   TF tolerance, Vent status, Labs  REASON FOR ASSESSMENT:   Consult, Ventilator Enteral/tube feeding initiation and management  ASSESSMENT:   Pt with PMH of alcoholic liver disease, severe alcohol abuse, hepatic steatosis, essential hypertension, GERD, PTSD, depression, and schizoaffective disorder admitted after being found down at home with Severe toxic metabolic encephalopathy due to hepatic encephalopathy, severe alcoholic hepatitis, alcoholic cirrhosis, severe hypokalemia, and AKI.   Pt discussed during ICU rounds and with RN.  Concern for malnutrition due to history of ETOH abuse.   11/30 imitated TF at 20 ml/hr due to low K+/PO4  Patient is currently intubated on ventilator support MV: 10.6 L/min Temp (24hrs), Avg:97.3 F (36.3 C), Min:96.8 F (36 C), Max:98.8 F (37.1 C)  Medications reviewed and include: colace, folic acid, SSI, lactulose, solumedrol, miralax, thiamine KCl runs Precedex  Labs reviewed: K+2.8, Magnesium: 1.6   UOP: 490 ml  OG tube: tip in gastric antrum   Diet Order:   Diet Order            Diet NPO time specified  Diet effective now                 EDUCATION NEEDS:   No  education needs have been identified at this time  Skin:  Skin Assessment: Skin Integrity Issues: Skin Integrity Issues:: Stage I Stage I: buttocks  Last BM:  800 ml via rectal tube  Height:   Ht Readings from Last 1 Encounters:  07/16/20 6' (1.829 m)    Weight:   Wt Readings from Last 1 Encounters:  07/16/20 98.4 kg    Ideal Body Weight:  80.9 kg  BMI:  Body mass index is 29.42 kg/m.  Estimated Nutritional Needs:   Kcal:  2200-2400  Protein:  150-175 grams  Fluid:  2 L/day  Lockie Pares., RD, LDN, CNSC See AMiON for contact information

## 2020-07-18 NOTE — Progress Notes (Signed)
NAME:  [name], MRN:  [id number], DOB:  07/29/1971, LOS: 2 ADMISSION DATE:  07/16/2020, CONSULTATION DATE:  07/18/20  REFERRING MD:  [name], CHIEF COMPLAINT:  Seizure   Brief History   [name] a 49 year old male with past medical history notable for EtOH use disorder, hepatic steatosis, essential hypertension, GERD, schizoaffective disorder presents after found down at home for concern for seizures, patient intubated in ED for for airway protection due to depressed consciousness.   History of present illness   [name] a 49 year old male with past medical history notable for EtOH use disorder, hepatic steatosis, essential hypertension, GERD, schizoaffective disorder presents after found down at home during a wellness check. History is limited given patient's level of consciousness. Per chart review patient was found at home with multiple cans of alcohol around him. Mother reported that patient states he was attempting to withdraw from alcohol a couple days ago. Sister last saw the patient 5-6 days ago at that time he was more jaundices and moving slower than at baseline.   In ED patient with decreased conciseness and seen to have jerking motions concerning for seizures. He was intubated with EEG ordered. Found to have  Elevated AST 207, ALT 68, Bili 21, Cr. 1.68. K 2.6, Mg 1.3,  albumin 1.8. Lactate of 9.6.  CBC with WBC of 14.2, hbg 11.5 improved from last admission of 8, plt 156. INR 1.6. Ammonia of 207. EKG with QT prolongation.   Past Medical History  He,  has a past medical history of Alcohol abuse, Anxiety, Depression, GERD (gastroesophageal reflux disease), Hypertension, Hepatic steatosis, Psychosis ([place]), PTSD (post-traumatic stress disorder), Schizoaffective disorder, Seizure disorder ([place]), and Symptomatic anemia.   Significant Hospital Events   11/29 Admission  Consults:  Neurology  Procedures:  11/29 Intubation  Significant Diagnostic Tests:   CT head 11/29 > no acute  Micro Data:  11/29 Blood cultures >> 11/30 Tracheal Asp >>  Antimicrobials:  11/29 Vancomycin > 12/1 11/29 Flagyl x 1 dose 11/29 Cefepime   Interim history/subjective:  See HPI  Objective   Blood pressure 104/62, pulse 88, temperature (!) 97.2 F (36.2 C), temperature source Bladder, resp. rate 16, height 6' (1.829 m), weight 98.4 kg, SpO2 94 %.    Vent Mode: PSV FiO2 (%):  [40 %] 40 % Set Rate:  [15 bmp] 15 bmp Vt Set:  [620 mL] 620 mL PEEP:  [5 cmH20] 5 cmH20 Pressure Support:  [8 cmH20] 8 cmH20 Plateau Pressure:  [15 cmH20-18 cmH20] 15 cmH20   Intake/Output Summary (Last 24 hours) at 07/18/2020 0908 Last data filed at 07/18/2020 0800 Gross per 24 hour  Intake 2404.18 ml  Output 1360 ml  Net 1044.18 ml   Filed Weights   07/16/20 1615  Weight: 98.4 kg    Examination: General: Adult male on vent  Eyes: Jaundice noted and edema to orbits  HENT: ETT tube in place, mucous membranes moist Neck: trachea midline, no thyromegaly Lungs: Clear breath sounds, no wheeze/crackles  Cardiovascular: Regular rate and rhythm, no murmurs Abdomen: Distended, normal BS, no masses Skin: Jaundice, scattered petechiae across abdomen, generalized edema noted to uppers  Neuro: Sedated, follows simple commands, moves all extremities, +cough/gag. Withdrawals with upper extremities   Resolved Hospital Problem list   None  Assessment & Plan:   Acute hepatic encephalopathy vs. Noncongestive status epilepticus  Hyperammonemia History of seizures from alcohol withdrawal Presented with decreased conciousness with possible seizure activity. Elevated ammonia of 204. Normal ethanol levels. Loaded on Keppra 2  g.  -UDS +Benzo and THC Plan - Neurology Following  - Continue lactulose 30 mg every 8 hours - Continue EEG >> D/C Propofol and Start Precedex. Will D/C EEG this afternoon if no seizures noted - Seizure percautions - IV thiamine, folate per  tube  Respiratory Insuffiencey in setting of above Plan - Vent Support > Neuro status barrier to extubation  - Trend ABG/CXR - VAP protocol   Alcoholic hepatitis  Hyperbilirubinemia Lactic acidosis. Suspect Type B.  Presents with elevated LFTs AST>ALT c/w alcohol use. Maddrey Score of 34 would benefit from glucocorticoids. Negative for acetaminophen, salicylate, and ethanol on admission Plan - methylprednisolone 32 mg daily, will check [place] score in 7 days to assess benefit - Trend lactate.   Leucocytosis, afebrile  -Patient with thick green secretions -CXR with no acute Plan -Follow Culture Data   -Trend WBC and Fever curve  -D/C Vancomycin. Continue Cefepime for now.   Prolonged QT Plan -hold home Seroquel and sertraline -Trend EKG  AKI Hypokalemia, Hypomagnesemia Plan -Trend BMP -Strict I/Os - Replace - Monitor electrolytes  Anemia Plan -Trend CBC. Transfuse for hemoglobin <7  HTN Plan On metoprolol tartrate 12.5 twice daily, hold for now as Bps have been soft.  Schizoaffective Disorder - Hold home Seroquel and Zoloft in setting of Qt prolongation.  Best practice (evaluated daily)  Diet: NPO Pain/Anxiety/Delirium protocol (if indicated): Fentanyl, versed, propfol VAP protocol (if indicated): Yes DVT prophylaxis: Heparin GI prophylaxis: Pantoprazole  Glucose control: None Mobility: Bed rest last date of multidisciplinary goals of care discussion N/a Family and staff present N/a Summary of discussion Updated mother on plan of care.  Code Status: Full Disposition: ICU  Labs   CBC: Recent Labs  Lab 07/16/20 1536 07/16/20 1541 07/16/20 1720 07/17/20 0059 07/18/20 0341  WBC  --  14.2*  --  12.1* 15.0*  NEUTROABS  --  12.0*  --   --   --   HGB 12.9* 11.5* 12.9* 9.6* 7.9*  HCT 38.0* 32.2* 38.0* 26.3* 21.7*  MCV  --  88.0  --  84.8 85.8  PLT  --  156  --  130* 123*    Basic Metabolic Panel: Recent Labs  Lab 07/16/20 1541 07/16/20 1541  07/16/20 1720 07/16/20 2100 07/17/20 1102 07/17/20 1714 07/18/20 0341  NA 136   < > 134* 134* 134* 136 139  K 2.6*   < > <2.0* <2.0* <2.0* <2.0* 2.8*  CL 80*  --   --  83* 88* 90* 94*  CO2 31  --   --  29 29 30 27   GLUCOSE 138*  --   --  135* 131* 169* 135*  BUN 26*  --   --  24* 23* 24* 25*  CREATININE 1.68*  --   --  1.41* 1.68* 1.62* 1.61*  CALCIUM 6.7*  --   --  6.3* 6.5* 6.5* 6.6*  MG 1.3*  --   --   --  1.6* 1.6* 1.6*  PHOS  --   --   --   --  1.5* 3.0 2.7   < > = values in this interval not displayed.   GFR: Estimated Creatinine Clearance: 68.9 mL/min (A) (by C-G formula based on SCr of 1.61 mg/dL (H)). Recent Labs  Lab 07/16/20 1541 07/16/20 2100 07/17/20 0059 07/17/20 1102 07/17/20 1516 07/18/20 0341  PROCALCITON  --  0.75  --  1.30  --  1.54  WBC 14.2*  --  12.1*  --   --  15.0*  LATICACIDVEN 9.6* 7.7*  --   --  2.6*  --     Liver Function Tests: Recent Labs  Lab 07/16/20 1541 07/17/20 1102 07/18/20 0341  AST 207* 166* 130*  ALT 68* 44 39  ALKPHOS 265* 175* 153*  BILITOT 21.1* 18.3* 17.0*  PROT 6.3* 5.4* 5.5*  ALBUMIN 1.8* 1.8* 2.4*   No results for input(s): LIPASE, AMYLASE in the last 168 hours. Recent Labs  Lab 07/16/20 1543 07/17/20 1516  AMMONIA 207* 114*    ABG    Component Value Date/Time   PHART 7.542 (H) 07/16/2020 1720   PCO2ART 47.5 07/16/2020 1720   PO2ART 358 (H) 07/16/2020 1720   HCO3 40.9 (H) 07/16/2020 1720   TCO2 42 (H) 07/16/2020 1720   O2SAT 100.0 07/16/2020 1720     Coagulation Profile: Recent Labs  Lab 07/16/20 1541 07/17/20 0059  INR 1.6* 1.5*    Cardiac Enzymes: Recent Labs  Lab 07/16/20 1541  CKTOTAL 183    HbA1C: Hgb A1c MFr Bld  Date/Time Value Ref Range Status  07/17/2020 06:50 AM 5.7 (H) 4.8 - 5.6 % Final    Comment:    (NOTE) Pre diabetes:          5.7%-6.4%  Diabetes:              >6.4%  Glycemic control for   <7.0% adults with diabetes   06/03/2018 06:39 AM 5.3 4.8 - 5.6 % Final     Comment:    (NOTE) Pre diabetes:          5.7%-6.4% Diabetes:              >6.4% Glycemic control for   <7.0% adults with diabetes     CBG: Recent Labs  Lab 07/17/20 1617 07/17/20 1948 07/17/20 2334 07/18/20 0330 07/18/20 0843  GLUCAP 156* 168* 127* 126* 170*   Past Medical History  He,  has a past medical history of Alcohol abuse, Anxiety, Depression, GERD (gastroesophageal reflux disease), Hypertension, Psychosis ([place]), PTSD (post-traumatic stress disorder), Schizoaffective disorder, Seizure disorder ([place]), and Symptomatic anemia.   Surgical History    Past Surgical History:  Procedure Laterality Date  . ESOPHAGOGASTRODUODENOSCOPY (EGD) WITH PROPOFOL N/A 05/05/2020   Procedure: ESOPHAGOGASTRODUODENOSCOPY (EGD) WITH PROPOFOL;  Surgeon: [name], MD;  Location: [facility] ENDOSCOPY;  Service: Endoscopy;  Laterality: N/A;  . FOOT SURGERY     right  . HAND SURGERY       Social History   reports that he has been smoking cigarettes. He has a 45.00 pack-year smoking history. He has never used smokeless tobacco. He reports current alcohol use. He reports current drug use. Frequency: 1.00 time per week. Drug: Marijuana.   Family History   His family history includes Alcoholism in his mother; CAD in his father; Depression in his brother.   Allergies Allergies  Allergen Reactions  . Wellbutrin [Bupropion] Other (See Comments)    Caused seizures     Home Medications  Prior to Admission medications   Medication Sig Start Date End Date Taking? Authorizing Provider  allopurinol (ZYLOPRIM) 100 MG tablet Take 2 tablets (200 mg total) by mouth daily. 04/20/20   Nicolette Bang, DO  loperamide (IMODIUM) 2 MG capsule Take 4 mg by mouth as needed for diarrhea or loose stools.    [provider]  metoprolol tartrate (LOPRESSOR) 25 MG tablet Take 0.5 tablets (12.5 mg total) by mouth 2 (two) times daily. 07/11/20 08/10/20  Nicolette Bang, DO  MILK THISTLE  PO Take 1 capsule by mouth daily.    [provider]  Misc. Devices MISC Blood Pressure monitor. Dx :Hypertension 06/09/19   [name], MD  Multiple Vitamin (MULTIVITAMIN WITH MINERALS) TABS tablet Take 1 tablet by mouth daily. 05/08/20   [place] Eric J, DO  pantoprazole (PROTONIX) 40 MG tablet Take 1 tablet (40 mg total) by mouth daily. 07/11/20 08/10/20  Nicolette Bang, DO  QUEtiapine (SEROQUEL) 50 MG tablet Take 1 tablet (50 mg total) by mouth at bedtime. 06/06/20 07/06/20  Nicolette Bang, DO  sertraline (ZOLOFT) 25 MG tablet Take 1 tablet (25 mg total) by mouth daily. 07/11/20   Nicolette Bang, DO   Total critical care time: 34 minutes  Critical care time was exclusive of separately billable procedures and treating other patients.  Critical care was necessary to treat or prevent imminent or life-threatening deterioration.  Critical care was time spent personally by me on the following activities: development of treatment plan with patient and/or surrogate as well as nursing, discussions with consultants, evaluation of patient's response to treatment, examination of patient, obtaining history from patient or surrogate, ordering and performing treatments and interventions, ordering and review of laboratory studies, ordering and review of radiographic studies, pulse oximetry, re-evaluation of patient's condition and participation in multidisciplinary rounds.  Hayden Pedro, AGACNP-BC [facility]  Pgr: [phone]  PCCM Pgr: [phone]

## 2020-07-19 ENCOUNTER — Inpatient Hospital Stay (HOSPITAL_COMMUNITY): Payer: Medicare Other

## 2020-07-19 DIAGNOSIS — R4182 Altered mental status, unspecified: Secondary | ICD-10-CM | POA: Diagnosis not present

## 2020-07-19 LAB — POCT I-STAT 7, (LYTES, BLD GAS, ICA,H+H)
Acid-Base Excess: 5 mmol/L — ABNORMAL HIGH (ref 0.0–2.0)
Bicarbonate: 30.7 mmol/L — ABNORMAL HIGH (ref 20.0–28.0)
Calcium, Ion: 1.05 mmol/L — ABNORMAL LOW (ref 1.15–1.40)
HCT: 29 % — ABNORMAL LOW (ref 39.0–52.0)
Hemoglobin: 9.9 g/dL — ABNORMAL LOW (ref 13.0–17.0)
O2 Saturation: 96 %
Patient temperature: 98.6
Potassium: 2.8 mmol/L — ABNORMAL LOW (ref 3.5–5.1)
Sodium: 150 mmol/L — ABNORMAL HIGH (ref 135–145)
TCO2: 32 mmol/L (ref 22–32)
pCO2 arterial: 50.3 mmHg — ABNORMAL HIGH (ref 32.0–48.0)
pH, Arterial: 7.393 (ref 7.350–7.450)
pO2, Arterial: 88 mmHg (ref 83.0–108.0)

## 2020-07-19 LAB — BASIC METABOLIC PANEL
Anion gap: 16 — ABNORMAL HIGH (ref 5–15)
BUN: 24 mg/dL — ABNORMAL HIGH (ref 6–20)
CO2: 24 mmol/L (ref 22–32)
Calcium: 7.7 mg/dL — ABNORMAL LOW (ref 8.9–10.3)
Chloride: 101 mmol/L (ref 98–111)
Creatinine, Ser: 1.4 mg/dL — ABNORMAL HIGH (ref 0.61–1.24)
GFR, Estimated: >60 mL/min (ref >60)
Glucose, Bld: 238 mg/dL — ABNORMAL HIGH (ref 70–99)
Potassium: 2.8 mmol/L — ABNORMAL LOW (ref 3.5–5.1)
Sodium: 141 mmol/L (ref 135–145)

## 2020-07-19 LAB — GLUCOSE, CAPILLARY
Glucose-Capillary: 177 mg/dL — ABNORMAL HIGH (ref 70–99)
Glucose-Capillary: 172 mg/dL — ABNORMAL HIGH (ref 70–99)
Glucose-Capillary: 150 mg/dL — ABNORMAL HIGH (ref 70–99)
Glucose-Capillary: 142 mg/dL — ABNORMAL HIGH (ref 70–99)
Glucose-Capillary: 133 mg/dL — ABNORMAL HIGH (ref 70–99)
Glucose-Capillary: 147 mg/dL — ABNORMAL HIGH (ref 70–99)

## 2020-07-19 LAB — URINE CULTURE: Culture: 1000 — AB

## 2020-07-19 LAB — HEPATIC FUNCTION PANEL
ALT: 35 U/L (ref 0–44)
AST: 105 U/L — ABNORMAL HIGH (ref 15–41)
Albumin: 3 g/dL — ABNORMAL LOW (ref 3.5–5.0)
Alkaline Phosphatase: 177 U/L — ABNORMAL HIGH (ref 38–126)
Bilirubin, Direct: 13 mg/dL — ABNORMAL HIGH (ref 0.0–0.2)
Total Bilirubin: 19.4 mg/dL (ref 0.3–1.2)
Total Protein: 5.8 g/dL — ABNORMAL LOW (ref 6.5–8.1)

## 2020-07-19 LAB — PHOSPHORUS
Phosphorus: 2.4 mg/dL — ABNORMAL LOW (ref 2.5–4.6)
Phosphorus: 2.5 mg/dL (ref 2.5–4.6)

## 2020-07-19 LAB — CBC
HCT: 23.6 % — ABNORMAL LOW (ref 39.0–52.0)
Hemoglobin: 7.9 g/dL — ABNORMAL LOW (ref 13.0–17.0)
MCH: 30.7 pg (ref 26.0–34.0)
MCHC: 33.5 g/dL (ref 30.0–36.0)
MCV: 91.8 fL (ref 80.0–100.0)
Platelets: 114 10*3/uL — ABNORMAL LOW (ref 150–400)
RBC: 2.57 MIL/uL — ABNORMAL LOW (ref 4.22–5.81)
RDW: 22.7 % — ABNORMAL HIGH (ref 11.5–15.5)
WBC: 18.4 10*3/uL — ABNORMAL HIGH (ref 4.0–10.5)
nRBC: 0.1 % (ref 0.0–0.2)

## 2020-07-19 LAB — AMMONIA: Ammonia: 88 umol/L — ABNORMAL HIGH (ref 9–35)

## 2020-07-19 LAB — MAGNESIUM
Magnesium: 2.3 mg/dL (ref 1.7–2.4)
Magnesium: 1.9 mg/dL (ref 1.7–2.4)

## 2020-07-19 MED ORDER — LORAZEPAM 2 MG/ML IJ SOLN
1.0000 mg | INTRAMUSCULAR | Status: DC | PRN
  Administered 2020-07-19 – 2020-07-20 (×4): 2 mg via INTRAVENOUS
  Filled 2020-07-19 (×3): qty 1

## 2020-07-19 MED ORDER — FENTANYL CITRATE (PF) 100 MCG/2ML IJ SOLN
INTRAMUSCULAR | Status: AC
  Filled 2020-07-19: qty 2

## 2020-07-19 MED ORDER — POTASSIUM CHLORIDE 10 MEQ/50ML IV SOLN
10.0000 meq | INTRAVENOUS | Status: AC
  Administered 2020-07-19 (×4): 10 meq via INTRAVENOUS
  Filled 2020-07-19 (×4): qty 50

## 2020-07-19 MED ORDER — FENTANYL CITRATE (PF) 100 MCG/2ML IJ SOLN
100.0000 ug | Freq: Once | INTRAMUSCULAR | Status: DC
  Filled 2020-07-19: qty 2

## 2020-07-19 MED ORDER — IPRATROPIUM-ALBUTEROL 0.5-2.5 (3) MG/3ML IN SOLN
RESPIRATORY_TRACT | Status: AC
  Administered 2020-07-19: 3 mL
  Filled 2020-07-19: qty 3

## 2020-07-19 MED ORDER — FUROSEMIDE 10 MG/ML IJ SOLN
40.0000 mg | Freq: Once | INTRAMUSCULAR | Status: AC
  Administered 2020-07-19: 40 mg via INTRAVENOUS
  Filled 2020-07-19: qty 4

## 2020-07-19 MED ORDER — ROCURONIUM BROMIDE 10 MG/ML (PF) SYRINGE
PREFILLED_SYRINGE | INTRAVENOUS | Status: AC
  Administered 2020-07-19: 100 mg
  Filled 2020-07-19: qty 10

## 2020-07-19 MED ORDER — LORAZEPAM 2 MG/ML IJ SOLN
INTRAMUSCULAR | Status: AC
  Filled 2020-07-19: qty 1

## 2020-07-19 MED ORDER — ROCURONIUM BROMIDE 50 MG/5ML IV SOLN
100.0000 mg | Freq: Once | INTRAVENOUS | Status: DC
  Filled 2020-07-19: qty 10

## 2020-07-19 MED ORDER — ETOMIDATE 2 MG/ML IV SOLN
INTRAVENOUS | Status: AC
  Filled 2020-07-19: qty 20

## 2020-07-19 MED ORDER — ETOMIDATE 2 MG/ML IV SOLN
30.0000 mg | Freq: Once | INTRAVENOUS | Status: AC
  Administered 2020-07-19: 30 mg via INTRAVENOUS

## 2020-07-19 MED ORDER — PROPOFOL 1000 MG/100ML IV EMUL
5.0000 ug/kg/min | INTRAVENOUS | Status: DC
  Administered 2020-07-19: 30 ug/kg/min via INTRAVENOUS
  Administered 2020-07-20: 40 ug/kg/min via INTRAVENOUS
  Administered 2020-07-20: 30 ug/kg/min via INTRAVENOUS
  Filled 2020-07-19 (×3): qty 100

## 2020-07-19 MED ORDER — POTASSIUM CHLORIDE 20 MEQ PO PACK
40.0000 meq | PACK | ORAL | Status: DC
  Administered 2020-07-19: 40 meq
  Filled 2020-07-19: qty 2

## 2020-07-19 MED ORDER — IPRATROPIUM-ALBUTEROL 0.5-2.5 (3) MG/3ML IN SOLN
3.0000 mL | Freq: Three times a day (TID) | RESPIRATORY_TRACT | Status: DC
  Administered 2020-07-19 – 2020-07-26 (×20): 3 mL via RESPIRATORY_TRACT
  Filled 2020-07-19 (×18): qty 3

## 2020-07-19 MED ORDER — IPRATROPIUM-ALBUTEROL 0.5-2.5 (3) MG/3ML IN SOLN
RESPIRATORY_TRACT | Status: AC
  Filled 2020-07-19: qty 3

## 2020-07-19 MED ORDER — ALBUMIN HUMAN 25 % IV SOLN
25.0000 g | Freq: Four times a day (QID) | INTRAVENOUS | Status: AC
  Administered 2020-07-19 (×3): 25 g via INTRAVENOUS
  Filled 2020-07-19 (×3): qty 100

## 2020-07-19 MED ORDER — LORAZEPAM 2 MG/ML IJ SOLN
1.0000 mg | Freq: Four times a day (QID) | INTRAMUSCULAR | Status: DC
  Filled 2020-07-19: qty 1

## 2020-07-19 MED ORDER — PANTOPRAZOLE SODIUM 40 MG PO PACK
40.0000 mg | PACK | Freq: Every day | ORAL | Status: DC
  Administered 2020-07-20 – 2020-07-24 (×5): 40 mg
  Filled 2020-07-19 (×5): qty 20

## 2020-07-19 MED ORDER — ETOMIDATE 2 MG/ML IV SOLN
INTRAVENOUS | Status: AC
  Administered 2020-07-19: 20 mg
  Filled 2020-07-19: qty 20

## 2020-07-19 NOTE — Progress Notes (Signed)
Extubation Procedure Note  Patient Details:   Name: [name] DOB: 03/03/1971 MRN: [id number]   Airway Documentation:    Vent end date: 07/19/20 Vent end time: 1157   Evaluation  O2 sats: stable throughout Complications: No apparent complications Patient did tolerate procedure well. Bilateral Breath Sounds: Clear, Diminished   Yes,  Prior to extubation, pt did have a positive cuff leak. Pt was extubated to [name]. Pt's SpO2 continue to decrease w/[name]. RT placed pt on 15L [place] and SpO2 is now 90%. Pt was able to state his name after extubation. No stridor was noted. RT will continue to monitor pt.  Jorje Guild 07/19/2020, 12:18 PM

## 2020-07-19 NOTE — Plan of Care (Signed)
gned             gned           Plan of care note   Asked to assess patient for agitation. Patient extubated today. Having agitation. Taking mask off. Is on maximal precedex. No benzos today. Heavy drinker per history. Sats in high 80's but patient removing non rebreather consistently. Had tried high flow nasal canula and regula nasal canula previously  -Will start patient on CIWA with Ativan prn -Not great candidate for phenobarb given hepatic impairment -Attain CXR -Low threshold for intubation if no improvement in RR (currnelty in 20-30's) and saturations once patients withdrawal has been treated.

## 2020-07-19 NOTE — Progress Notes (Signed)
RN called RT to bedside to assess pt. Pt's SpO2 decreased to low 80's while on 15L [place]. RT at bedside along with MD. RT placed pt on NRB, SpO2 now 90%. Per MD, give breathing treatment and continue with NRB. RT will continue to monitor pt.

## 2020-07-19 NOTE — Progress Notes (Signed)
Pt intubated at  2120.  Meds Given: 100mg  Rocuronium  30mg  Etomidate  Sats currently 77%. RN will continue to monitor

## 2020-07-19 NOTE — Progress Notes (Signed)
[facility] Progress Note Patient Name: [name] DOB: 19-Sep-1970 MRN: [id number]   Date of Service  07/19/2020  HPI/Events of Note  Patient extubated today and respiratory status is very marginal. He drops his sats into the 70's when he becomes agitated and pulls off his NRB mask. Review of CXR earlier today reveals low lung volumes and L base atelectasis.   eICU Interventions  Plan: 1. Increase ceiling on Precedex IV infusion to 1.6 mcg/kg/hour. 2. Will ask PCCM ground team to evaluate the patient at bedside.         Jachob Mcclean Cornelia Copa 07/19/2020, 8:24 PM

## 2020-07-19 NOTE — Progress Notes (Signed)
RT began breathing treatment via nebulizer.  When NRB removed patients sat's dropped to low 80's.  Treatment was stopped and patient placed back on NRB.  Patient now satting 93%.

## 2020-07-19 NOTE — Procedures (Signed)
Endotracheal Intubation Procedure Note  Indication for endotracheal intubation: respiratory failure. Sedation: etomidate. Paralytic: rocuronium. Lidocaine: no. Atropine: no. Equipment:Glidescope 3 Cricoid Pressure: no. Number of attempts: 1. ETT location confirmed by by auscultation and ETCO2 monitor.  Patient failed trail of atican. Became more calm but sats persisted in low 80's and RR persisted in 30's. CXR with evidence of aspiration. [name] grade 1 view intubated at 25 at teeth with 7.5 tube.    [name] 07/19/2020

## 2020-07-19 NOTE — Progress Notes (Addendum)
NAME:  [name], MRN:  [id number], DOB:  1970/10/01, LOS: 3 ADMISSION DATE:  07/16/2020, CONSULTATION DATE:  07/19/20  REFERRING MD:  Gareth Morgan, CHIEF COMPLAINT:  Seizure   Brief History   [name] a 49 year old male with past medical history notable for EtOH use disorder, hepatic steatosis, essential hypertension, GERD, schizoaffective disorder presents after found down at home for concern for seizures, patient intubated in ED for for airway protection due to depressed consciousness.   History of present illness   Ryan McGintyis a 49 year old male with past medical history notable for EtOH use disorder, hepatic steatosis, essential hypertension, GERD, schizoaffective disorder presents after found down at home during a wellness check. History is limited given patient's level of consciousness. Per chart review patient was found at home with multiple cans of alcohol around him. Mother reported that patient states he was attempting to withdraw from alcohol a couple days ago. Sister last saw the patient 5-6 days ago at that time he was more jaundices and moving slower than at baseline.   In ED patient with decreased conciseness and seen to have jerking motions concerning for seizures. He was intubated with EEG ordered. Found to have  Elevated AST 207, ALT 68, Bili 21, Cr. 1.68. K 2.6, Mg 1.3,  albumin 1.8. Lactate of 9.6.  CBC with WBC of 14.2, hbg 11.5 improved from last admission of 8, plt 156. INR 1.6. Ammonia of 207. EKG with QT prolongation.   Past Medical History  He,  has a past medical history of Alcohol abuse, Anxiety, Depression, GERD (gastroesophageal reflux disease), Hypertension, Hepatic steatosis, Psychosis ([place]), PTSD (post-traumatic stress disorder), Schizoaffective disorder, Seizure disorder ([place]), and Symptomatic anemia.   Significant Hospital Events   11/29 Admission  Consults:  Neurology  Procedures:  11/29 Intubation 12/1 PICC  Significant  Diagnostic Tests:  CT head 11/29 > no acute 11/29 RUQ US> small volume ascites. Hepatic cirrhosis, likely fatty infiltration os liver, features suggesting portal venous hypertension GB sludge without visible cholelithiasis. Mild GB thickening, nonspecific.   Micro Data:  11/29 Blood cultures >> 11/30 Tracheal Asp >> 11/29 UCx> 1000 col E.Coli   Antimicrobials:  11/29 Vancomycin > 12/1 11/29 Flagyl x 1 dose 11/29 Cefepime   Interim history/subjective:  Continues on NE  Weaning vent this morning PSV/CPAP FiO2 40% PEEP 5  Objective   Blood pressure 111/74, pulse 75, temperature 99 F (37.2 C), resp. rate (!) 27, height 6' (1.829 m), weight 98.4 kg, SpO2 95 %.    Vent Mode: PRVC FiO2 (%):  [40 %-50 %] 50 % Set Rate:  [15 bmp] 15 bmp Vt Set:  [620 mL] 620 mL PEEP:  [5 cmH20] 5 cmH20 Pressure Support:  [8 cmH20] 8 cmH20 Plateau Pressure:  [19 cmH20-21 cmH20] 20 cmH20   Intake/Output Summary (Last 24 hours) at 07/19/2020 0739 Last data filed at 07/19/2020 0700 Gross per 24 hour  Intake 3345.85 ml  Output 1920 ml  Net 1425.85 ml   Filed Weights   07/16/20 1615  Weight: 98.4 kg    Examination: General: Chronically and critically ill appearing middle aged M, intubated sedated NAD  HEENT: Icteric sclera. ETT secure pink mm. Trachea midline  Lungs: Symmetrical chest expansion, no accessory use on PSV/CPAP. No adventitious sounds   Cardiovascular: rrr s1s2 no rgm Cap refill < 3 seconds  Abdomen: Protuberant. Soft. + bowel sounds.  Ext: no obvious joint deformity. Mild edema. No clubbing. Skin: Jaundice. Scattered petechiae and ecchymosis over abdomen. BLE with dry, erythematous,  blanching rash.  Neuro: Sedated. Awakens to voice, intermittently following commands but largely confused appearing-- moving BUE BLE spontaneously. PERRL, + cough  Resolved Hospital Problem list   None  Assessment & Plan:   Acute hepatic encephalopathy  Hyperammonemia EtOH abuse History of  seizures from alcohol withdrawal -Presented with decreased conciousness with possible seizure activity. Elevated ammonia of 204. Normal ethanol levels. Loaded on Keppra 2 g.   -UDS +Benzo and THC -EEG without evidence of sz  P - Neurology following - Continue lactulose 30 mg every 8 hours - Seizure percautions - IV thiamine, folate per tube - delirium precautions - RASS goal  0 to -1 - Precedex. Will add low-dose schedule BZD with hx of etoh withdrawal sz    Respiratory Insuffiencey in setting of above requiring intubation  P - Wean vent as able -mental status barrier to extubation at present  -WUA/SBT qD - VAP protocol, pulm hygiene   Alcoholic hepatitis  Hyperbilirubinemia Suspected portal venous hypertension Findings on RUQ [place]-- small vol ascites, recanalization of umbilical vein, diminished velocities  Lactic acidosis. Suspect Type B.  Presents with elevated LFTs AST>ALT c/w alcohol use. Maddrey Score of 34 would benefit from glucocorticoids. Negative for acetaminophen, salicylate, and ethanol on admission P - methylprednisolone 32 mg daily, will check Lille score in 7 days to assess benefit - Trend LA   AKI with oliguria  -Fluid resusc in ED, is still net positive -with findings suggestive of portal venous HTN on RUQ [place], cirrhosis-- concerning for possible mild HRS P -Continue NE. Goal MAP > 75 -Albumin -trend renal indices, strict I/O   Hypotension -intravascular hypovolemia in setting of hepatic dysfunction vs sepsis vs sedation side effect P  -NE and albumin as above   Hypokalemia Hypomagnesemia Hypophosphatemia P -replace as needed -continue to trend   Leucocytosis, afebrile  -Patient with thick green secretions bur CXR benign -RUQ [place] with small volume ascites -PCT 1.54  P -Follow Culture Data   -Trend WBC and Fever curve  -Cont cefepime -follow trach aspirate  -If WBC fails to improve, consider repeat US for possible para   Prolonged QT P -hold  home Seroquel and sertraline -Trend EKG  Anemia P -Trend CBC. Transfuse for hemoglobin <7  HTN P -holding antihypertensives in setting of hypotension   Schizoaffective Disorder - Hold home Seroquel and Zoloft in setting of Qt prolongation.  Best practice (evaluated daily)  Diet: NPO Pain/Anxiety/Delirium protocol (if indicated): Fent, precedex. Scheduled low-dose Ativan  VAP protocol (if indicated): Yes DVT prophylaxis: Heparin  GI prophylaxis: Pantoprazole  Glucose control: None Mobility: Bed rest last date of multidisciplinary goals of care discussion N/a Family and staff present N/a Summary of discussion: 12/2 Mother updated via phone RE clinical status. Mother is preparing to board plane from out of state to come the [place]. Will plan to continue family communications upon her arrival  Code Status: Full Disposition: ICU  Labs   CBC: Recent Labs  Lab 07/16/20 1541 07/16/20 1720 07/17/20 0059 07/18/20 0341 07/19/20 0600  WBC 14.2*  --  12.1* 15.0* 18.4*  NEUTROABS 12.0*  --   --   --   --   HGB 11.5* 12.9* 9.6* 7.9* 7.9*  HCT 32.2* 38.0* 26.3* 21.7* 23.6*  MCV 88.0  --  84.8 85.8 91.8  PLT 156  --  130* 123* 114*    Basic Metabolic Panel: Recent Labs  Lab 07/17/20 1102 07/17/20 1714 07/18/20 0341 07/18/20 1503 07/19/20 0600  NA 134* 136 139 142 141  K <2.0* <2.0* 2.8* 2.7* 2.8*  CL 88* 90* 94* 99 101  CO2 29 30 27 27 24   GLUCOSE 131* 169* 135* 161* 238*  BUN 23* 24* 25* 25* 24*  CREATININE 1.68* 1.62* 1.61* 1.69* 1.40*  CALCIUM 6.5* 6.5* 6.6* 6.8* 7.7*  MG 1.6* 1.6* 1.6* 2.2 2.3  PHOS 1.5* 3.0 2.7 1.9* 2.4*   GFR: Estimated Creatinine Clearance: 79.3 mL/min (A) (by C-G formula based on SCr of 1.4 mg/dL (H)). Recent Labs  Lab 07/16/20 1541 07/16/20 2100 07/17/20 0059 07/17/20 1102 07/17/20 1516 07/18/20 0341 07/18/20 1503 07/19/20 0600  PROCALCITON  --  0.75  --  1.30  --  1.54  --   --   WBC 14.2*  --  12.1*  --   --  15.0*  --  18.4*   LATICACIDVEN 9.6* 7.7*  --   --  2.6*  --  2.5*  --     Liver Function Tests: Recent Labs  Lab 07/16/20 1541 07/17/20 1102 07/18/20 0341 07/19/20 0600  AST 207* 166* 130* 105*  ALT 68* 44 39 35  ALKPHOS 265* 175* 153* 177*  BILITOT 21.1* 18.3* 17.0* 19.4*  PROT 6.3* 5.4* 5.5* 5.8*  ALBUMIN 1.8* 1.8* 2.4* 3.0*   No results for input(s): LIPASE, AMYLASE in the last 168 hours. Recent Labs  Lab 07/16/20 1543 07/17/20 1516 07/19/20 0600  AMMONIA 207* 114* 88*    ABG    Component Value Date/Time   PHART 7.542 (H) 07/16/2020 1720   PCO2ART 47.5 07/16/2020 1720   PO2ART 358 (H) 07/16/2020 1720   HCO3 40.9 (H) 07/16/2020 1720   TCO2 42 (H) 07/16/2020 1720   O2SAT 100.0 07/16/2020 1720     Coagulation Profile: Recent Labs  Lab 07/16/20 1541 07/17/20 0059  INR 1.6* 1.5*    Cardiac Enzymes: Recent Labs  Lab 07/16/20 1541  CKTOTAL 183    HbA1C: Hgb A1c MFr Bld  Date/Time Value Ref Range Status  07/17/2020 06:50 AM 5.7 (H) 4.8 - 5.6 % Final    Comment:    (NOTE) Pre diabetes:          5.7%-6.4%  Diabetes:              >6.4%  Glycemic control for   <7.0% adults with diabetes   06/03/2018 06:39 AM 5.3 4.8 - 5.6 % Final    Comment:    (NOTE) Pre diabetes:          5.7%-6.4% Diabetes:              >6.4% Glycemic control for   <7.0% adults with diabetes     CBG: Recent Labs  Lab 07/18/20 1225 07/18/20 1549 07/18/20 1946 07/18/20 2347 07/19/20 0330  GLUCAP 151* 161* 178* 171* 177*   CRITICAL CARE Performed by: Cristal Generous   Total critical care time: 45 minutes  Critical care time was exclusive of separately billable procedures and treating other patients. Critical care was necessary to treat or prevent imminent or life-threatening deterioration.  Critical care was time spent personally by me on the following activities: development of treatment plan with patient and/or surrogate as well as nursing, discussions with consultants, evaluation  of patient's response to treatment, examination of patient, obtaining history from patient or surrogate, ordering and performing treatments and interventions, ordering and review of laboratory studies, ordering and review of radiographic studies, pulse oximetry and re-evaluation of patient's condition.  Eliseo Gum MSN, AGACNP-BC [place] [phone] If no answer, [phone]  07/19/2020, 7:40 AM

## 2020-07-20 DIAGNOSIS — N179 Acute kidney failure, unspecified: Secondary | ICD-10-CM | POA: Diagnosis not present

## 2020-07-20 DIAGNOSIS — J9600 Acute respiratory failure, unspecified whether with hypoxia or hypercapnia: Secondary | ICD-10-CM | POA: Diagnosis not present

## 2020-07-20 DIAGNOSIS — A419 Sepsis, unspecified organism: Secondary | ICD-10-CM

## 2020-07-20 DIAGNOSIS — K7290 Hepatic failure, unspecified without coma: Secondary | ICD-10-CM | POA: Diagnosis not present

## 2020-07-20 LAB — COMPREHENSIVE METABOLIC PANEL
ALT: 32 U/L (ref 0–44)
AST: 87 U/L — ABNORMAL HIGH (ref 15–41)
Albumin: 2.7 g/dL — ABNORMAL LOW (ref 3.5–5.0)
Alkaline Phosphatase: 153 U/L — ABNORMAL HIGH (ref 38–126)
Anion gap: 15 (ref 5–15)
BUN: 26 mg/dL — ABNORMAL HIGH (ref 6–20)
CO2: 24 mmol/L (ref 22–32)
Calcium: 7.7 mg/dL — ABNORMAL LOW (ref 8.9–10.3)
Chloride: 107 mmol/L (ref 98–111)
Creatinine, Ser: 1.21 mg/dL (ref 0.61–1.24)
GFR, Estimated: >60 mL/min (ref >60)
Glucose, Bld: 188 mg/dL — ABNORMAL HIGH (ref 70–99)
Potassium: 2.8 mmol/L — ABNORMAL LOW (ref 3.5–5.1)
Sodium: 146 mmol/L — ABNORMAL HIGH (ref 135–145)
Total Bilirubin: 21.6 mg/dL (ref 0.3–1.2)
Total Protein: 5.2 g/dL — ABNORMAL LOW (ref 6.5–8.1)

## 2020-07-20 LAB — CULTURE, RESPIRATORY W GRAM STAIN

## 2020-07-20 LAB — MAGNESIUM
Magnesium: 1.9 mg/dL (ref 1.7–2.4)
Magnesium: 2.7 mg/dL — ABNORMAL HIGH (ref 1.7–2.4)

## 2020-07-20 LAB — POCT I-STAT 7, (LYTES, BLD GAS, ICA,H+H)
Acid-Base Excess: 6 mmol/L — ABNORMAL HIGH (ref 0.0–2.0)
Acid-Base Excess: 7 mmol/L — ABNORMAL HIGH (ref 0.0–2.0)
Bicarbonate: 28.9 mmol/L — ABNORMAL HIGH (ref 20.0–28.0)
Bicarbonate: 29.8 mmol/L — ABNORMAL HIGH (ref 20.0–28.0)
Calcium, Ion: 1 mmol/L — ABNORMAL LOW (ref 1.15–1.40)
Calcium, Ion: 1.02 mmol/L — ABNORMAL LOW (ref 1.15–1.40)
HCT: 24 % — ABNORMAL LOW (ref 39.0–52.0)
HCT: 25 % — ABNORMAL LOW (ref 39.0–52.0)
Hemoglobin: 8.2 g/dL — ABNORMAL LOW (ref 13.0–17.0)
Hemoglobin: 8.5 g/dL — ABNORMAL LOW (ref 13.0–17.0)
O2 Saturation: 67 %
O2 Saturation: 89 %
Patient temperature: 98.2
Patient temperature: 98.2
Potassium: 2.7 mmol/L — CL (ref 3.5–5.1)
Potassium: 2.8 mmol/L — ABNORMAL LOW (ref 3.5–5.1)
Sodium: 145 mmol/L (ref 135–145)
Sodium: 149 mmol/L — ABNORMAL HIGH (ref 135–145)
TCO2: 30 mmol/L (ref 22–32)
TCO2: 31 mmol/L (ref 22–32)
pCO2 arterial: 32.6 mmHg (ref 32.0–48.0)
pCO2 arterial: 34.7 mmHg (ref 32.0–48.0)
pH, Arterial: 7.541 — ABNORMAL HIGH (ref 7.350–7.450)
pH, Arterial: 7.556 — ABNORMAL HIGH (ref 7.350–7.450)
pO2, Arterial: 30 mmHg — CL (ref 83.0–108.0)
pO2, Arterial: 47 mmHg — ABNORMAL LOW (ref 83.0–108.0)

## 2020-07-20 LAB — VITAMIN C: Vitamin C: <0.1 mg/dL — ABNORMAL LOW (ref 0.4–2.0)

## 2020-07-20 LAB — CBC
HCT: 22.5 % — ABNORMAL LOW (ref 39.0–52.0)
Hemoglobin: 7.5 g/dL — ABNORMAL LOW (ref 13.0–17.0)
MCH: 31.5 pg (ref 26.0–34.0)
MCHC: 33.3 g/dL (ref 30.0–36.0)
MCV: 94.5 fL (ref 80.0–100.0)
Platelets: 94 10*3/uL — ABNORMAL LOW (ref 150–400)
RBC: 2.38 MIL/uL — ABNORMAL LOW (ref 4.22–5.81)
RDW: 24 % — ABNORMAL HIGH (ref 11.5–15.5)
WBC: 21.3 10*3/uL — ABNORMAL HIGH (ref 4.0–10.5)
nRBC: 0.1 % (ref 0.0–0.2)

## 2020-07-20 LAB — AMMONIA: Ammonia: 77 umol/L — ABNORMAL HIGH (ref 9–35)

## 2020-07-20 LAB — LACTIC ACID, PLASMA: Lactic Acid, Venous: 1.8 mmol/L (ref 0.5–1.9)

## 2020-07-20 LAB — GLUCOSE, CAPILLARY
Glucose-Capillary: 134 mg/dL — ABNORMAL HIGH (ref 70–99)
Glucose-Capillary: 112 mg/dL — ABNORMAL HIGH (ref 70–99)
Glucose-Capillary: 160 mg/dL — ABNORMAL HIGH (ref 70–99)
Glucose-Capillary: 146 mg/dL — ABNORMAL HIGH (ref 70–99)
Glucose-Capillary: 159 mg/dL — ABNORMAL HIGH (ref 70–99)
Glucose-Capillary: 145 mg/dL — ABNORMAL HIGH (ref 70–99)

## 2020-07-20 LAB — PROTIME-INR
INR: 1.9 — ABNORMAL HIGH (ref 0.8–1.2)
Prothrombin Time: 21.2 seconds — ABNORMAL HIGH (ref 11.4–15.2)

## 2020-07-20 LAB — PHOSPHORUS
Phosphorus: 2.9 mg/dL (ref 2.5–4.6)
Phosphorus: 2.5 mg/dL (ref 2.5–4.6)

## 2020-07-20 MED ORDER — MIDAZOLAM HCL 2 MG/2ML IJ SOLN: 2.0000 mg | Freq: Once | INTRAMUSCULAR | Status: DC

## 2020-07-20 MED ORDER — FENTANYL CITRATE (PF) 100 MCG/2ML IJ SOLN
100.0000 ug | Freq: Once | INTRAMUSCULAR | Status: DC
Start: 2020-07-21 — End: 2020-07-21

## 2020-07-20 MED ORDER — THIAMINE HCL 100 MG PO TABS
100.0000 mg | ORAL_TABLET | Freq: Every day | ORAL | Status: DC
  Administered 2020-07-21 – 2020-07-24 (×4): 100 mg
  Filled 2020-07-20 (×4): qty 1

## 2020-07-20 MED ORDER — CEFAZOLIN SODIUM-DEXTROSE 2-4 GM/100ML-% IV SOLN
2.0000 g | Freq: Three times a day (TID) | INTRAVENOUS | Status: AC
  Administered 2020-07-20 – 2020-07-25 (×16): 2 g via INTRAVENOUS
  Filled 2020-07-20 (×17): qty 100

## 2020-07-20 MED ORDER — ETOMIDATE 2 MG/ML IV SOLN
20.0000 mg | Freq: Once | INTRAVENOUS | Status: DC
Start: 2020-07-21 — End: 2020-07-21

## 2020-07-20 MED ORDER — POTASSIUM CHLORIDE 10 MEQ/50ML IV SOLN
10.0000 meq | INTRAVENOUS | Status: AC
  Administered 2020-07-20 (×4): 10 meq via INTRAVENOUS
  Filled 2020-07-20 (×4): qty 50

## 2020-07-20 MED ORDER — RIFAXIMIN 550 MG PO TABS
550.0000 mg | ORAL_TABLET | Freq: Two times a day (BID) | ORAL | Status: DC
  Administered 2020-07-20 – 2020-07-24 (×9): 550 mg
  Filled 2020-07-20 (×9): qty 1

## 2020-07-20 MED ORDER — MAGNESIUM SULFATE 2 GM/50ML IV SOLN
2.0000 g | Freq: Once | INTRAVENOUS | Status: AC
  Administered 2020-07-20: 2 g via INTRAVENOUS
  Filled 2020-07-20: qty 50

## 2020-07-20 MED ORDER — PROPOFOL 1000 MG/100ML IV EMUL
5.0000 ug/kg/min | INTRAVENOUS | Status: DC
Start: 2020-07-21 — End: 2020-07-21

## 2020-07-20 NOTE — Progress Notes (Addendum)
NAME:  [name], MRN:  [id number], DOB:  Apr 18, 1971, LOS: 4 ADMISSION DATE:  07/16/2020, CONSULTATION DATE:  07/20/20  REFERRING MD:  Gareth Morgan, CHIEF COMPLAINT:  Seizure   Brief History   [name] a 49 year old male with past medical history notable for EtOH use disorder, hepatic steatosis, essential hypertension, GERD, schizoaffective disorder presents after found down at home for concern for seizures, patient intubated in ED for for airway protection due to depressed consciousness.   History of present illness   Ryan McGintyis a 49 year old male with past medical history notable for EtOH use disorder, hepatic steatosis, essential hypertension, GERD, schizoaffective disorder presents after found down at home during a wellness check. History is limited given patient's level of consciousness. Per chart review patient was found at home with multiple cans of alcohol around him. Mother reported that patient states he was attempting to withdraw from alcohol a couple days ago. Sister last saw the patient 5-6 days ago at that time he was more jaundices and moving slower than at baseline.   In ED patient with decreased conciseness and seen to have jerking motions concerning for seizures. He was intubated with EEG ordered. Found to have  Elevated AST 207, ALT 68, Bili 21, Cr. 1.68. K 2.6, Mg 1.3,  albumin 1.8. Lactate of 9.6.  CBC with WBC of 14.2, hbg 11.5 improved from last admission of 8, plt 156. INR 1.6. Ammonia of 207. EKG with QT prolongation.   Past Medical History  He,  has a past medical history of Alcohol abuse, Anxiety, Depression, GERD (gastroesophageal reflux disease), Hypertension, Hepatic steatosis, Psychosis ([place]), PTSD (post-traumatic stress disorder), Schizoaffective disorder, Seizure disorder ([place]), and Symptomatic anemia.   Significant Hospital Events   11/29 Admission 12/2 extubated, reintubated.  12/3 green/brown liquid in ETT, concerning for aspiration  event   Consults:  Neurology  Procedures:  11/29 Intubation- 12/2.  12/2>> 12/1 PICC  Significant Diagnostic Tests:  CT head 11/29 > no acute 11/29 RUQ US> small volume ascites. Hepatic cirrhosis, likely fatty infiltration os liver, features suggesting portal venous hypertension GB sludge without visible cholelithiasis. Mild GB thickening, nonspecific.  CXR 12/2> Diminished lung volumes Bilateral ASD L>R, ETT 2.4cm above carina  Micro Data:  11/29 Blood cultures >> 11/30 Tracheal Asp > few staph aureus  11/29 UCx> 1000 col E.Coli   Antimicrobials:  11/29 Vancomycin > 12/1 11/29 Flagyl x 1 dose 11/29 Cefepime >12/3 12/3 Zosyn>>   Interim history/subjective:  Re-intubated overnight, and there is suspicion for aspiration Disconnected self from vent multiple times overnight due to agitation/confusion, with subsequent desaturations. Improved when reconnected to vent.   Left on FiO2 100% and PEEP 15 overnight for post-intubation pao2 50. SpO2 98% this morning, vent settings weaned to FiO2 60% and PEEP 10, however follow up ABG with PaO2 47 prompting escalation in FiO2.   Is now on FiO2 100% and PEEP 10  There is now green/brown liquid in ETT and HME, but scant secretions when suctioned.   Objective   Blood pressure 119/60, pulse 78, temperature (!) 97.5 F (36.4 C), resp. rate (!) 28, height 6' (1.829 m), weight 98.4 kg, SpO2 95 %.    Vent Mode: PRVC FiO2 (%):  [40 %-100 %] 100 % Set Rate:  [23 bmp] 23 bmp Vt Set:  [460 mL] 460 mL PEEP:  [15 cmH20] 15 cmH20 Pressure Support:  [8 cmH20] 8 cmH20 Plateau Pressure:  [16 cmH20-27 cmH20] 26 cmH20   Intake/Output Summary (Last 24 hours) at 07/20/2020 [phone]  Last data filed at 07/20/2020 0600 Gross per 24 hour  Intake 1945.05 ml  Output 3300 ml  Net -1354.95 ml   Filed Weights   07/16/20 1615  Weight: 98.4 kg    Examination: General: Chronically and critically ill appearing middle aged M, intubated sedated NAD  HEENT:  Icteric sclera. Pink mm. Green/brown fluid lining inside of ETT and collecting in HME. ETT secure. Lungs: Symmetrical chest expansion, overbreathing vent.  Diminished breath sounds with some crackles.  Cardiovascular: Abdomen: Protuberant, soft, + bowel sounds  Ext: No obvious joint deformity, no cyanosis or clubbing Mild extremity edema  Skin: Jaundice. Scattered petechiae and ecchymosis over abdomen, BLE with scattered dry, flat, erythematous rash   Neuro: Sedated, awakens to voice, moving BUE BLE spontaneously and following commands   Resolved Hospital Problem list   None  Assessment & Plan:   Acute respiratory failure with hypoxia requiring re-intubation -initially intubated for resp insufficiency 2/2 HE. extubated 12/2, progressively more hypoxic and dyspneic, reintubated 12/2 Staph PNA Aspiration PNA -green/brown liquid in ETT and vent circuit 12/2-12/3 P -wean vent as able -VAP, pulm hygiene -change out HME and continue deep suctioning  -changing abx to zosyn 12/3 -follow for trach aspirate sensitivities   Acute hepatic encephalopathy  Hyperammonemia -Presented with decreased conciousness with possible seizure activity. Elevated ammonia of 204. Normal ethanol levels. Loaded on Keppra 2 g.   -UDS +Benzo and THC -EEG without evidence of sz  P - Continue lactulose 30 mg every 8 hours -adding rifaximin  - Seizure percautions  EtOH abuse Hx  EtOH withdrawal with seizure -Precedex + BZDs.  -Thiamin, multivitamins  -seizure precautions   Alcoholic hepatitis  Hyperbilirubinemia Suspected portal venous hypertension Findings on RUQ [place]-- small vol ascites, recanalization of umbilical vein, diminished velocities  Lactic acidosis, Suspect Type B.  Presents with elevated LFTs AST>ALT c/w alcohol use. Maddrey Score of 34 would benefit from glucocorticoids. Negative for acetaminophen, salicylate, and ethanol on admission P - methylprednisolone 32 mg daily, will check Lille score  in 7 days to assess benefit - check LA 12/3 with rising WBC and aspiration event  -trend CMP, coags   Septic shock due to Staph PNA and aspiration PNA -with likely intravascular hypovolemia in setting of hepatic dysfunction  -Leukocytosis has preceded aspiration, but does align with prior trach aspirate showing staph -RUQ [place] with small volume ascites   P  -s/p albumin + NE  -Continue NE, wean as able for MAP > 65 -Follow Culture Data   -Following aspiration, changing cefepime to zosyn -- this should be appropriate for staph PNA, aspiration and intraabdominal processes  -trend WBC and fever curve -If WBC fails to improve, consider repeat US for possible para   AKI with oliguria, improving -Fluid resusc in ED, is still net positive -with findings suggestive of portal venous HTN on RUQ [place], cirrhosis-- concerning for possible mild HRS. S/p albumin + NE  P -trend renal indices, strict I/O  -given improving Cr and UOP, MAP goal > 65. If UOP decreases with this target MAP, can increase back to target map >75   Hypokalemia Hypomagnesemia Hypophosphatemia P -replace as needed -continue to trend   Prolonged QT P -hold home Seroquel and sertraline -Trend EKG  Anemia P -Trend CBC. Transfuse for hemoglobin <7  HTN P -holding antihypertensives in setting of shock  Schizoaffective Disorder - Hold home Seroquel and Zoloft in setting of Qt prolongation.  Best practice (evaluated daily)  Diet: NPO Pain/Anxiety/Delirium protocol (if indicated): Fent, precedex. Scheduled  low-dose Ativan  VAP protocol (if indicated): Yes DVT prophylaxis: Heparin  GI prophylaxis: Pantoprazole  Glucose control: None Mobility: Bed rest last date of multidisciplinary goals of care discussion N/a Family and staff present N/a Summary of discussion: 12/2 Mother updated via phone RE clinical status. She was preparing to fly to [place]. As of 12/3, has not yet arrived.   Code Status: Full Disposition:  ICU  Labs   CBC: Recent Labs  Lab 07/16/20 1541 07/16/20 1541 07/16/20 1720 07/17/20 0059 07/18/20 0341 07/19/20 0600 07/19/20 2304  WBC 14.2*  --   --  12.1* 15.0* 18.4*  --   NEUTROABS 12.0*  --   --   --   --   --   --   HGB 11.5*   < > 12.9* 9.6* 7.9* 7.9* 9.9*  HCT 32.2*   < > 38.0* 26.3* 21.7* 23.6* 29.0*  MCV 88.0  --   --  84.8 85.8 91.8  --   PLT 156  --   --  130* 123* 114*  --    < > = values in this interval not displayed.    Basic Metabolic Panel: Recent Labs  Lab 07/17/20 1102 07/17/20 1102 07/17/20 1714 07/17/20 1714 07/18/20 0341 07/18/20 1503 07/19/20 0600 07/19/20 1700 07/19/20 2304 07/20/20 0500  NA 134*   < > 136  --  139 142 141  --  150*  --   K <2.0*   < > <2.0*  --  2.8* 2.7* 2.8*  --  2.8*  --   CL 88*  --  90*  --  94* 99 101  --   --   --   CO2 29  --  30  --  27 27 24   --   --   --   GLUCOSE 131*  --  169*  --  135* 161* 238*  --   --   --   BUN 23*  --  24*  --  25* 25* 24*  --   --   --   CREATININE 1.68*  --  1.62*  --  1.61* 1.69* 1.40*  --   --   --   CALCIUM 6.5*  --  6.5*  --  6.6* 6.8* 7.7*  --   --   --   MG 1.6*   < > 1.6*   < > 1.6* 2.2 2.3 1.9  --  1.9  PHOS 1.5*   < > 3.0   < > 2.7 1.9* 2.4* 2.5  --  2.9   < > = values in this interval not displayed.   GFR: Estimated Creatinine Clearance: 79.3 mL/min (A) (by C-G formula based on SCr of 1.4 mg/dL (H)). Recent Labs  Lab 07/16/20 1541 07/16/20 2100 07/17/20 0059 07/17/20 1102 07/17/20 1516 07/18/20 0341 07/18/20 1503 07/19/20 0600  PROCALCITON  --  0.75  --  1.30  --  1.54  --   --   WBC 14.2*  --  12.1*  --   --  15.0*  --  18.4*  LATICACIDVEN 9.6* 7.7*  --   --  2.6*  --  2.5*  --     Liver Function Tests: Recent Labs  Lab 07/16/20 1541 07/17/20 1102 07/18/20 0341 07/19/20 0600  AST 207* 166* 130* 105*  ALT 68* 44 39 35  ALKPHOS 265* 175* 153* 177*  BILITOT 21.1* 18.3* 17.0* 19.4*  PROT 6.3* 5.4* 5.5* 5.8*  ALBUMIN 1.8* 1.8* 2.4* 3.0*   No  results  for input(s): LIPASE, AMYLASE in the last 168 hours. Recent Labs  Lab 07/16/20 1543 07/17/20 1516 07/19/20 0600  AMMONIA 207* 114* 88*    ABG    Component Value Date/Time   PHART 7.393 07/19/2020 2304   PCO2ART 50.3 (H) 07/19/2020 2304   PO2ART 88 07/19/2020 2304   HCO3 30.7 (H) 07/19/2020 2304   TCO2 32 07/19/2020 2304   O2SAT 96.0 07/19/2020 2304     Coagulation Profile: Recent Labs  Lab 07/16/20 1541 07/17/20 0059  INR 1.6* 1.5*    Cardiac Enzymes: Recent Labs  Lab 07/16/20 1541  CKTOTAL 183    HbA1C: Hgb A1c MFr Bld  Date/Time Value Ref Range Status  07/17/2020 06:50 AM 5.7 (H) 4.8 - 5.6 % Final    Comment:    (NOTE) Pre diabetes:          5.7%-6.4%  Diabetes:              >6.4%  Glycemic control for   <7.0% adults with diabetes   06/03/2018 06:39 AM 5.3 4.8 - 5.6 % Final    Comment:    (NOTE) Pre diabetes:          5.7%-6.4% Diabetes:              >6.4% Glycemic control for   <7.0% adults with diabetes     CBG: Recent Labs  Lab 07/19/20 1142 07/19/20 1608 07/19/20 1938 07/19/20 2326 07/20/20 0340  GLUCAP 150* 142* 133* 147* 134*   CRITICAL CARE Performed by: Cristal Generous   Total critical care time: 52 minutes  Critical care time was exclusive of separately billable procedures and treating other patients. Critical care was necessary to treat or prevent imminent or life-threatening deterioration.  Critical care was time spent personally by me on the following activities: development of treatment plan with patient and/or surrogate as well as nursing, discussions with consultants, evaluation of patient's response to treatment, examination of patient, obtaining history from patient or surrogate, ordering and performing treatments and interventions, ordering and review of laboratory studies, ordering and review of radiographic studies, pulse oximetry and re-evaluation of patient's condition.  Ryan Gum MSN, AGACNP-BC [place] [phone] If no answer, [phone] 07/20/2020, 7:28 AM

## 2020-07-20 NOTE — Progress Notes (Signed)
PCCM Brief Progress Note  AKI had been improving with increased MAP goal and albumin in previous days -today we attempted decreasing MAP goal to 65 -in hours following, pt has had progressively decreased UOP  P -Increase MAP goal to >75, titrate NE   Fever, mild -T 100.4 P -With ongoing hepatic dysfunction, will try ice packs instead of APAP    Eliseo Gum MSN, AGACNP-BC [place] [phone] If no answer, [phone] 07/20/2020, 5:29 PM

## 2020-07-20 NOTE — Progress Notes (Signed)
PCCM Family Communication Note  Pts sister and mother arrived to bedside from out of state. Clinical updates provided and all questions answered. RN present throughout discussion.  Eliseo Gum MSN, AGACNP-BC [place] [phone] If no answer, [phone] 07/20/2020, 1:07 PM

## 2020-07-20 NOTE — Progress Notes (Signed)
Nutrition Follow-up  DOCUMENTATION CODES:   Not applicable  INTERVENTION:   Tube feeding via OG tube: Vital 1.5 @ 20 ml/hr increase by 10 ml every 8 hours to goal rate of 55 ml/h (1320 ml per day) Prosource TF 90 ml TID MVI with minerals  Provides 2220 kcal, 155 gm protein, 1003 ml free water daily  Monitor magnesium and phosphorus, MD to replete as needed, as pt is at risk for refeeding syndrome given history of severe ETOH abuse/liver disease.  Vitamin labs pending:  Vitamin A Vitamin B6 Vitamin C  NUTRITION DIAGNOSIS:   Increased nutrient needs related to catabolic illness as evidenced by estimated needs. Ongoing.   GOAL:   Patient will meet greater than or equal to 90% of their needs Progressing.   MONITOR:   TF tolerance, Vent status, Labs  REASON FOR ASSESSMENT:   Consult, Ventilator Enteral/tube feeding initiation and management  ASSESSMENT:   Pt with PMH of alcoholic liver disease, severe alcohol abuse, hepatic steatosis, essential hypertension, GERD, PTSD, depression, and schizoaffective disorder admitted after being found down at home with Severe toxic metabolic encephalopathy due to hepatic encephalopathy, severe alcoholic hepatitis, alcoholic cirrhosis, severe hypokalemia, and AKI.   Pt discussed during ICU rounds and with RN.  Mom and sister from out of state at bedside.   11/30 imitated TF at 20 ml/hr due to low K+/PO4 12/2 extubated; re-intubated overnight   Patient is currently intubated on ventilator support MV: 15.4 L/min Temp (24hrs), Avg:98.6 F (37 C), Min:97.3 F (36.3 C), Max:100.4 F (38 C)  Medications reviewed and include: colace, folic acid, SSI, lactulose, solumedrol, MVI with minerals, miralax, thiamine Precedex  Levophed @ 7 mcg KCl/magsulfate  Labs reviewed: K+2.7, PO4 and magnesium WNL Vitamin D: 11.5 Pending labs:  Vitamin A Vitamin B6 Vitamin C  UOP: 2400 ml  OG tube: tip in gastric antrum   Diet Order:    Diet Order            Diet NPO time specified  Diet effective now                 EDUCATION NEEDS:   No education needs have been identified at this time  Skin:  Skin Assessment: Skin Integrity Issues: Skin Integrity Issues:: Stage I Stage I: buttocks  Last BM:  900 ml via rectal tube (on lactulose)  Height:   Ht Readings from Last 1 Encounters:  07/16/20 6' (1.829 m)    Weight:   Wt Readings from Last 1 Encounters:  07/16/20 98.4 kg    Ideal Body Weight:  80.9 kg  BMI:  Body mass index is 29.42 kg/m.  Estimated Nutritional Needs:   Kcal:  2200-2400  Protein:  150-175 grams  Fluid:  2 L/day  Lockie Pares., RD, LDN, CNSC See AMiON for contact information

## 2020-07-20 NOTE — Progress Notes (Signed)
OT Cancellation Note  Patient Details Name: Karas Pickerill MRN: [id number] DOB: October 03, 1970   Cancelled Treatment:    Reason Eval/Treat Not Completed: Patient not medically ready (100% FIO2 ) Requested by RN staff to hold at this time  Billey Chang, OTR/L  Acute Rehabilitation Services Pager: [phone] Office: [phone] .  07/20/2020, 12:41 PM

## 2020-07-20 NOTE — Progress Notes (Signed)
SLP Cancellation Note  Patient Details Name: [name] MRN: [id number] DOB: September 20, 1970   Cancelled treatment:       Reason Eval/Treat Not Completed: Medical issues which prohibited therapy   Zenab Gronewold, Katherene Ponto 07/20/2020, 7:50 AM

## 2020-07-20 NOTE — Progress Notes (Signed)
PT Cancellation Note  Patient Details Name: [name] MRN: [id number] DOB: 1970-09-13   Cancelled Treatment:    Reason Eval/Treat Not Completed: Medical issues which prohibited therapy at this time. Per RN, pt on 100% FiO2 and is requesting PT hold at this time. PT will continue to follow and evaluate when medically appropriate.   Hardie Pulley, DPT   Acute Rehabilitation Department Pager #: [phone]  Otho Bellows 07/20/2020, 12:49 PM

## 2020-07-20 NOTE — Progress Notes (Signed)
[facility] Progress Note Patient Name: [name] DOB: [birth year] MRN: [id number]   Date of Service  07/20/2020  HPI/Events of Note  Agitation - Request for L wrist restraint.   eICU Interventions  Will order Left soft wrist restraint X 4 hours.      Intervention Category Major Interventions: Delirium, psychosis, severe agitation - evaluation and management  [name] 07/20/2020, 7:01 AM

## 2020-07-21 ENCOUNTER — Inpatient Hospital Stay (HOSPITAL_COMMUNITY): Payer: Medicare Other

## 2020-07-21 DIAGNOSIS — A419 Sepsis, unspecified organism: Principal | ICD-10-CM

## 2020-07-21 DIAGNOSIS — R6521 Severe sepsis with septic shock: Secondary | ICD-10-CM | POA: Diagnosis not present

## 2020-07-21 DIAGNOSIS — J9600 Acute respiratory failure, unspecified whether with hypoxia or hypercapnia: Secondary | ICD-10-CM | POA: Diagnosis not present

## 2020-07-21 LAB — COMPREHENSIVE METABOLIC PANEL
ALT: 31 U/L (ref 0–44)
AST: 89 U/L — ABNORMAL HIGH (ref 15–41)
Albumin: 2.7 g/dL — ABNORMAL LOW (ref 3.5–5.0)
Alkaline Phosphatase: 176 U/L — ABNORMAL HIGH (ref 38–126)
Anion gap: 15 (ref 5–15)
BUN: 34 mg/dL — ABNORMAL HIGH (ref 6–20)
CO2: 26 mmol/L (ref 22–32)
Calcium: 8.1 mg/dL — ABNORMAL LOW (ref 8.9–10.3)
Chloride: 108 mmol/L (ref 98–111)
Creatinine, Ser: 1.22 mg/dL (ref 0.61–1.24)
GFR, Estimated: >60 mL/min (ref >60)
Glucose, Bld: 184 mg/dL — ABNORMAL HIGH (ref 70–99)
Potassium: 2.5 mmol/L — CL (ref 3.5–5.1)
Sodium: 149 mmol/L — ABNORMAL HIGH (ref 135–145)
Total Bilirubin: 24.8 mg/dL (ref 0.3–1.2)
Total Protein: 5.6 g/dL — ABNORMAL LOW (ref 6.5–8.1)

## 2020-07-21 LAB — CBC
HCT: 24.2 % — ABNORMAL LOW (ref 39.0–52.0)
Hemoglobin: 8.1 g/dL — ABNORMAL LOW (ref 13.0–17.0)
MCH: 32.3 pg (ref 26.0–34.0)
MCHC: 33.5 g/dL (ref 30.0–36.0)
MCV: 96.4 fL (ref 80.0–100.0)
Platelets: 86 10*3/uL — ABNORMAL LOW (ref 150–400)
RBC: 2.51 MIL/uL — ABNORMAL LOW (ref 4.22–5.81)
RDW: 24.5 % — ABNORMAL HIGH (ref 11.5–15.5)
WBC: 19.5 10*3/uL — ABNORMAL HIGH (ref 4.0–10.5)
nRBC: 0.2 % (ref 0.0–0.2)

## 2020-07-21 LAB — GLUCOSE, CAPILLARY
Glucose-Capillary: 111 mg/dL — ABNORMAL HIGH (ref 70–99)
Glucose-Capillary: 125 mg/dL — ABNORMAL HIGH (ref 70–99)
Glucose-Capillary: 129 mg/dL — ABNORMAL HIGH (ref 70–99)
Glucose-Capillary: 129 mg/dL — ABNORMAL HIGH (ref 70–99)
Glucose-Capillary: 139 mg/dL — ABNORMAL HIGH (ref 70–99)
Glucose-Capillary: 143 mg/dL — ABNORMAL HIGH (ref 70–99)

## 2020-07-21 LAB — CULTURE, BLOOD (ROUTINE X 2)
Culture: NO GROWTH
Culture: NO GROWTH
Special Requests: ADEQUATE
Special Requests: ADEQUATE

## 2020-07-21 LAB — TRIGLYCERIDES: Triglycerides: 164 mg/dL — ABNORMAL HIGH (ref <150)

## 2020-07-21 LAB — AMMONIA: Ammonia: 71 umol/L — ABNORMAL HIGH (ref 9–35)

## 2020-07-21 MED ORDER — MIDAZOLAM HCL 2 MG/2ML IJ SOLN: 1.0000 mg | INTRAMUSCULAR | Status: DC | PRN

## 2020-07-21 MED ORDER — POTASSIUM CHLORIDE 20 MEQ PO PACK
40.0000 meq | PACK | Freq: Once | ORAL | Status: DC
Start: 2020-07-21 — End: 2020-07-21

## 2020-07-21 MED ORDER — VASOPRESSIN 20 UNITS/100 ML INFUSION FOR SHOCK
0.0000 [IU]/min | INTRAVENOUS | Status: DC
  Administered 2020-07-21 – 2020-07-24 (×6): 0.03 [IU]/min via INTRAVENOUS
  Filled 2020-07-21 (×6): qty 100

## 2020-07-21 MED ORDER — MIDAZOLAM HCL (PF) 5 MG/ML IJ SOLN
1.0000 mg | INTRAMUSCULAR | Status: DC | PRN
Start: 2020-07-21 — End: 2020-07-21

## 2020-07-21 MED ORDER — FENTANYL CITRATE (PF) 100 MCG/2ML IJ SOLN
25.0000 ug | INTRAMUSCULAR | Status: DC | PRN
  Administered 2020-07-23: 50 ug via INTRAVENOUS
  Filled 2020-07-21: qty 2

## 2020-07-21 MED ORDER — POTASSIUM CHLORIDE 10 MEQ/50ML IV SOLN
10.0000 meq | INTRAVENOUS | Status: AC
  Administered 2020-07-21 (×4): 10 meq via INTRAVENOUS
  Filled 2020-07-21 (×4): qty 50

## 2020-07-21 MED ORDER — VITAL HIGH PROTEIN PO LIQD
1000.0000 mL | ORAL | Status: DC
  Administered 2020-07-21: 1000 mL

## 2020-07-21 MED ORDER — POTASSIUM CHLORIDE 20 MEQ PO PACK
40.0000 meq | PACK | Freq: Once | ORAL | Status: AC
  Administered 2020-07-21: 40 meq
  Filled 2020-07-21: qty 2

## 2020-07-21 MED ORDER — VITAL 1.5 CAL PO LIQD
1000.0000 mL | ORAL | Status: DC
  Administered 2020-07-21 – 2020-07-22 (×2): 1000 mL
  Filled 2020-07-21: qty 1000

## 2020-07-21 NOTE — Progress Notes (Signed)
PT Cancellation Note  Patient Details Name: [name] MRN: [id number] DOB: 1971/05/11   Cancelled Treatment:    Reason Eval/Treat Not Completed: Medical issues which prohibited therapy at this time. The pt attempted to wean down to 60% FiO2 this AM and did not tolerate well, requiring return to 80% FiO2 with SpO2 of 90-92% per RN. PT will continue to follow and evaluate when medically appropriate.   Karma Ganja, PT, DPT   Acute Rehabilitation Department Pager #: [phone]   Otho Bellows 07/21/2020, 2:58 PM

## 2020-07-21 NOTE — Progress Notes (Signed)
CRITICAL VALUE ALERT  Critical Value:  K 2.5   Date & Time Notied:  07/21/20 0747  Provider Notified: Ann Lions MD [phone]  Orders Received/Actions taken: orders placed

## 2020-07-21 NOTE — Progress Notes (Signed)
NAME:  [name], MRN:  [id number], DOB:  1970/12/26, LOS: 5 ADMISSION DATE:  07/16/2020, CONSULTATION DATE:  07/21/20  REFERRING MD:  Gareth Morgan, CHIEF COMPLAINT:  Seizure   Brief History   [name] a 48 year old male with past medical history notable for EtOH use disorder, hepatic steatosis, essential hypertension, GERD, schizoaffective disorder presents after found down at home for concern for seizures, patient intubated in ED for for airway protection due to depressed consciousness.   History of present illness   Ryan McGintyis a 49 year old male with past medical history notable for EtOH use disorder, hepatic steatosis, essential hypertension, GERD, schizoaffective disorder presents after found down at home during a wellness check. History is limited given patient's level of consciousness. Per chart review patient was found at home with multiple cans of alcohol around him. Mother reported that patient states he was attempting to withdraw from alcohol a couple days ago. Sister last saw the patient 5-6 days ago at that time he was more jaundices and moving slower than at baseline.   In ED patient with decreased conciseness and seen to have jerking motions concerning for seizures. He was intubated with EEG ordered. Found to have  Elevated AST 207, ALT 68, Bili 21, Cr. 1.68. K 2.6, Mg 1.3,  albumin 1.8. Lactate of 9.6.  CBC with WBC of 14.2, hbg 11.5 improved from last admission of 8, plt 156. INR 1.6. Ammonia of 207. EKG with QT prolongation.   Past Medical History  He,  has a past medical history of Alcohol abuse, Anxiety, Depression, GERD (gastroesophageal reflux disease), Hypertension, Hepatic steatosis, Psychosis ([place]), PTSD (post-traumatic stress disorder), Schizoaffective disorder, Seizure disorder ([place]), and Symptomatic anemia.   Significant Hospital Events   11/29 Admission 12/2 extubated, reintubated.  12/3 - Re-intubated overnight, and there is suspicion for  aspiration Disconnected self from vent multiple times overnight due to agitation/confusion, with subsequent desaturations. Improved when reconnected to vent.  Left on FiO2 100% and PEEP 15 overnight for post-intubation pao2 50. SpO2 98% this morning, vent settings weaned to FiO2 60% and PEEP 10, however follow up ABG with PaO2 47 prompting escalation in FiO2.  There is now green/brown liquid in ETT and HME, but scant secretions when suctioned. MELD score - 27 points  Consults:  Neurology  Procedures:  11/29 Intubation- 12/2.  12/2>> 12/1 PICC  Significant Diagnostic Tests:  CT head 11/29 > no acute 11/29 RUQ US> small volume ascites. Hepatic cirrhosis, likely fatty infiltration os liver, features suggesting portal venous hypertension GB sludge without visible cholelithiasis. Mild GB thickening, nonspecific.  CXR 12/2> Diminished lung volumes Bilateral ASD L>R, ETT 2.4cm above carina  Micro Data:  11/29 Blood cultures >> 11/3-0 MRSA - neg /11/30 SARS cov2 - neg 11/30 flu pcr  - neg 11/30 Tracheal Asp > few staph aureus . MSSA 11/29 UCx> 1000 col E.Coli   Antimicrobials:  11/29 Vancomycin > 12/1 11/29 Flagyl x 1 dose 11/29 Cefepime >12/3 12/3 - cefazolin (MSSA sputum)  xxxx  12/3 - xifaxan with lactulose   Interim history/subjective:    12/4 - dense left sided pneumonia. On 90% fio2 / 10 peep -> reduced to 60% fio2 -> pulse ox 93%. On precedex gtt. On levophed gtt 50mcg . Febrile yesterday. Making urine. Very jaundiced., More awake. Vomiting subsised  Objective   Blood pressure 100/67, pulse 60, temperature (!) 97.3 F (36.3 C), resp. rate 20, height 6' (1.829 m), weight 102.9 kg, SpO2 95 %.    Vent Mode: PRVC FiO2 (%):  [  80 %-100 %] 90 % Set Rate:  [23 bmp] 23 bmp Vt Set:  [460 mL] 460 mL PEEP:  [10 cmH20] 10 cmH20 Plateau Pressure:  [14 cmH20-24 cmH20] 14 cmH20   Intake/Output Summary (Last 24 hours) at 07/21/2020 1125 Last data filed at 07/21/2020 1000 Gross per 24  hour  Intake 1606.72 ml  Output 2240 ml  Net -633.28 ml   Filed Weights   07/16/20 1615 07/21/20 0456  Weight: 98.4 kg 102.9 kg    General Appearance:  Looks criticall ill OBESE - +. JAUNDICE Head:  Normocephalic, without obvious abnormality, atraumatic Eyes:  PERRL - JAUJNDICE, conjunctiva/corneas - JAUNDICE     Ears:  Normal external ear canals, both ears Nose:  G tube - no Throat:  ETT TUBE - yes , OG tube - yes Neck:  Supple,  No enlargement/tenderness/nodules Lungs: Clear to auscultation bilaterally, Ventilator   Synchrony - yes Heart:  S1 and S2 normal, no murmur, CVP - no.  Pressors - yes levophed Abdomen:  Soft, no masses, no organomegaly, ASCITES + Genitalia / Rectal:  Not done Extremities:  Extremities- intact with deeema Skin:  ntact in exposed areas . Sacral area - not examined Neurologic:  Sedation - precedex gtt -> RASS - 0 to -1 . Moves all 4s - yes. CAM-ICU - unable to assess . Orientation - awakens and focuses. Not following commands       Resolved Hospital Problem list   None  Assessment & Plan:   Acute respiratory failure with hypoxia requiring re-intubation -initially intubated for resp insufficiency 2/2 HE. extubated 12/2, progressively more hypoxic and dyspneic, reintubated 12/2 Staph PNA Aspiration PNA -green/brown liquid in ETT and vent circuit 12/2-12/3 but this could have been resp secretions withhis jaundice   07/21/2020 - > does not  meet criteria for SBT/Extubation in setting of Acute Respiratory Failure due to Shock and high fio2 needs due tp Pneumonia. No further abnormal green secretions in ET tube. HAs bilious looking resp secrtion -p but  This could be jaundiced resp secretions as opposed to vomit/GI   P PRVC No SBT/Extubation  VAP bundle   Acute hepatic encephalopathy due to Hyperammonemia -UDS +Benzo and THC Presented with decreased conciousness with possible seizure activity. Elevated ammonia of 204. Normal ethanol levels -EEG  without evidence of sz   07/21/2020 - more awake on prcedex gtt and improving  P - Continue lactulose 30 mg every 8 hours -rifaximin  - Seizure percautions  EtOH abuse Hx  EtOH withdrawal with seizure -Precedex + BZDs.  -Thiamin, multivitamins  -seizure precautions   Alcoholic hepatitis at presentation elevated LFTs AST>ALT c/w alcohol use. Maddrey Score of 34. Negative for acetaminophen, salicylate, and ethanol on admission  Hyperbilirubinemia and Suspected portal venous hypertension - Findings on RUQ [place]-- small vol ascites, recanalization of umbilical vein, diminished velocities   Plan - methylprednisolone 32 mg daily since 07/16/20, will check Lille score in 7 days on 07/23/20 to assess benefit   Septic shock due to Staph PNA and aspiration PNA and E colii UTI  07/21/2020 - on levophed. Needs improving. S/p albumin ? Date. fEbrile again last night   P  -Continue NE, wean as able for MAP > 65 - add vasopressin 07/21/20 - continue cefazolin - tracheal aspirate recheck 07/21/20    AKI - -with findings suggestive of portal venous HTN on RUQ [place], cirrhosis-- concerning for possible mild HRS. S/p albumin + NE   07/21/2020 - improving  P -trend renal indices, strict I/O  -  given improving Cr and UOP, MAP goal > 65. If UOP decreases with this target MAP, can increase back to target map >75     Prolonged QTc 626 msec 07/17/20   P -holding home Seroquel and sertraline -Trend EKG - repeat 07/21/20  Anemia  07/21/2020 -0 no active bleed   P - - PRBC for hgb </= 6.9gm%    - exceptions are   -  if ACS susepcted/confirmed then transfuse for hgb </= 8.0gm%,  or    -  active bleeding with hemodynamic instability, then transfuse regardless of hemoglobin value   At at all times try to transfuse 1 unit prbc as possible with exception of active hemorrhage  THrombocytopenia  07/21/2020 -platelet 80s and stable  Plan  - monitor with SQ hepain  HTN P -holding  antihypertensives in setting of shock  Schizoaffective Disorder - Hold home Seroquel and Zoloft in setting of Qt prolongation.  Best practice (evaluated daily)  Diet: NPO - will rechallenge TF 07/21/20 (secretions might be resp that is containing bilirubin) Pain/Anxiety/Delirium protocol (if indicated): Fent, precedex. Prn versed  VAP protocol (if indicated): Yes DVT prophylaxis: Heparin  GI prophylaxis: Pantoprazole  Glucose control: None Mobility: Bed rest Code Status: Full Disposition: ICU Family updates: sister Weston Kallman called 07/21/20 on cell [phone] - LMTCB   MDT Goals of Care Discussion   Date of Discussion 07/20/20 which is 4th day  Primary service for patient CCM  Location of discussion bedside  Family and Staff present Patient sister, mother, RN and CCM APP - Eliseo Gum  Summary of discussion Updated and contnue current active cae  Followup goals of care due by 07/27/20  Misc comments if any          ATTESTATION & SIGNATURE   The patient Javante Nilsson is critically ill with multiple organ systems failure and requires high complexity decision making for assessment and support, frequent evaluation and titration of therapies, application of advanced monitoring technologies and extensive interpretation of multiple databases.   Critical Care Time devoted to patient care services described in this note is  40  Minutes. This time reflects time of care of this signee Dr Brand Males. This critical care time does not reflect procedure time, or teaching time or supervisory time of PA/NP/Med student/Med Resident etc but could involve care discussion time     Dr. Brand Males, M.D., [facility].C.P Pulmonary and Critical Care Medicine Staff Physician [facility] Pager: [phone], If no answer or between  15:00h - 7:00h: call [phone]  07/21/2020 11:25 AM     LABS    PULMONARY Recent Labs  Lab  07/16/20 1536 07/16/20 1720 07/19/20 2304 07/20/20 0907 07/20/20 0920  PHART  --  7.542* 7.393 7.541* 7.556*  PCO2ART  --  47.5 50.3* 34.7 32.6  PO2ART  --  358* 88 30* 47*  HCO3  --  40.9* 30.7* 29.8* 28.9*  TCO2 35* 42* 32 31 30  O2SAT  --  100.0 96.0 67.0 89.0    CBC Recent Labs  Lab 07/19/20 0600 07/19/20 2304 07/20/20 0758 07/20/20 0758 07/20/20 0907 07/20/20 0920 07/21/20 0648  HGB 7.9*   < > 7.5*   < > 8.5* 8.2* 8.1*  HCT 23.6*   < > 22.5*   < > 25.0* 24.0* 24.2*  WBC 18.4*  --  21.3*  --   --   --  19.5*  PLT 114*  --  94*  --   --   --  86*   < > = values in this interval not displayed.    COAGULATION Recent Labs  Lab 07/16/20 1541 07/17/20 0059 07/20/20 1230  INR 1.6* 1.5* 1.9*    CARDIAC  No results for input(s): TROPONINI in the last 168 hours. No results for input(s): PROBNP in the last 168 hours.   CHEMISTRY Recent Labs  Lab 07/18/20 0341 07/18/20 0341 07/18/20 1503 07/18/20 1503 07/19/20 0600 07/19/20 0600 07/19/20 1700 07/19/20 2304 07/19/20 2304 07/20/20 0500 07/20/20 0907 07/20/20 0907 07/20/20 0920 07/20/20 0920 07/20/20 1123 07/20/20 1648 07/21/20 0648  NA 139   < > 142   < > 141   < >  --  150*  --   --  145  --  149*  --  146*  --  149*  K 2.8*   < > 2.7*   < > 2.8*   < >  --  2.8*   < >  --  2.8*   < > 2.7*   < > 2.8*  --  2.5*  CL 94*  --  99  --  101  --   --   --   --   --   --   --   --   --  107  --  108  CO2 27  --  27  --  24  --   --   --   --   --   --   --   --   --  24  --  26  GLUCOSE 135*  --  161*  --  238*  --   --   --   --   --   --   --   --   --  188*  --  184*  BUN 25*  --  25*  --  24*  --   --   --   --   --   --   --   --   --  26*  --  34*  CREATININE 1.61*  --  1.69*  --  1.40*  --   --   --   --   --   --   --   --   --  1.21  --  1.22  CALCIUM 6.6*  --  6.8*  --  7.7*  --   --   --   --   --   --   --   --   --  7.7*  --  8.1*  MG 1.6*   < > 2.2  --  2.3  --  1.9  --   --  1.9  --   --   --   --    --  2.7*  --   PHOS 2.7   < > 1.9*  --  2.4*  --  2.5  --   --  2.9  --   --   --   --   --  2.5  --    < > = values in this interval not displayed.   Estimated Creatinine Clearance: 92.9 mL/min (by C-G formula based on SCr of 1.22 mg/dL).   LIVER Recent Labs  Lab 07/16/20 1541 07/16/20 1541 07/17/20 0059 07/17/20 1102 07/18/20 0341 07/19/20 0600 07/20/20 1123 07/20/20 1230 07/21/20 0648  AST 207*   < >  --  166* 130* 105* 87*  --  89*  ALT 68*   < >  --  44 39 35 32  --  31  ALKPHOS 265*   < >  --  175* 153* 177* 153*  --  176*  BILITOT 21.1*   < >  --  18.3* 17.0* 19.4* 21.6*  --  24.8*  PROT 6.3*   < >  --  5.4* 5.5* 5.8* 5.2*  --  5.6*  ALBUMIN 1.8*   < >  --  1.8* 2.4* 3.0* 2.7*  --  2.7*  INR 1.6*  --  1.5*  --   --   --   --  1.9*  --    < > = values in this interval not displayed.     INFECTIOUS Recent Labs  Lab 07/16/20 2100 07/16/20 2100 07/17/20 1102 07/17/20 1516 07/18/20 0341 07/18/20 1503 07/20/20 1230  LATICACIDVEN 7.7*   < >  --  2.6*  --  2.5* 1.8  PROCALCITON 0.75  --  1.30  --  1.54  --   --    < > = values in this interval not displayed.     ENDOCRINE CBG (last 3)  Recent Labs    07/20/20 2318 07/21/20 0358 07/21/20 0803  GLUCAP 145* 139* 125*         IMAGING x48h  - image(s) personally visualized  -   highlighted in bold DG CHEST PORT 1 VIEW  Result Date: 07/21/2020 CLINICAL DATA:  Aspiration pneumonia.  Follow-up exam EXAM: PORTABLE CHEST 1 VIEW COMPARISON:  07/19/2020 FINDINGS: Bilateral airspace lung opacities are noted, most confluent in the left mid to upper lung, similar to the most recent prior exam allowing for larger lung volumes on the current study. No new lung abnormalities. No convincing pleural effusion and no pneumothorax. Endotracheal tube tip now projects 4 cm above the carina. Naso/orogastric tube passes below the diaphragm well into the stomach, below the included field of view. Right PICC tip projects in the  lower superior vena cava. IMPRESSION: 1. No significant change in lung aeration from the most recent prior study. Persistent, left greater than right, airspace lung opacities. 2. Well-positioned support apparatus. Electronically Signed   By: Lajean Manes M.D.   On: 07/21/2020 09:43   DG CHEST PORT 1 VIEW  Result Date: 07/19/2020 CLINICAL DATA:  ETT placement, hypoxia EXAM: PORTABLE CHEST 1 VIEW COMPARISON:  Same-day radiograph FINDINGS: Endotracheal tube is positioned 2.4 cm from the carina. A right upper extremity PICC remains in place with the tip near the lower SVC. Telemetry leads and external support devices overlie the chest. Lung volumes slightly diminished from most recent comparison. Redemonstration of the widespread airspace opacities in both lungs, most focally confluent in the retrocardiac space and a left upper lung with associated air bronchograms. No visible pneumothorax or effusion. Cardiomediastinal contours are grossly stable though partially obscured by opacity. No acute osseous or soft tissue abnormality. IMPRESSION: Endotracheal tube positioned 2.4 cm from the carina. Could consider retraction 1-2 cm to position in the mid trachea. Stable PICC positioning. Slightly diminished lung volumes with persistent bilateral airspace opacities, left greater than right. These results will be called to the ordering clinician or representative by the Radiologist Assistant, and communication documented in the PACS or [employer]. Electronically Signed   By: Lovena Le M.D.   On: 07/19/2020 21:39   DG Chest Port 1 View  Result Date: 07/19/2020 CLINICAL DATA:  Hypoxia EXAM: PORTABLE CHEST 1 VIEW COMPARISON:  Radiograph 07/19/2020 FINDINGS: Interval removal  of the transesophageal and endotracheal tube seen on the comparison exams. A right PICC remains in the lower SVC. There is been interval development of extensive, patchy and confluent perihilar opacities throughout both lungs, left slightly  more pronounced than right. Mild fissural fissural thickening. No pneumothorax or visible effusion. Vascularity is indistinct. Cardiac silhouette appears enlarged though portions are obscured by overlying opacity. Remaining cardiomediastinal contours are unremarkable. No acute osseous or soft tissue abnormality. IMPRESSION: Rapid interval of extensive, patchy and confluent perihilar opacities throughout both lungs, left slightly more pronounced than right. Findings concerning for a symmetric pulmonary edema, ARDS or infection. Electronically Signed   By: Lovena Le M.D.   On: 07/19/2020 21:08   DG Abd Portable 1V  Result Date: 07/19/2020 CLINICAL DATA:  Check gastric catheter placement EXAM: PORTABLE ABDOMEN - 1 VIEW COMPARISON:  Chest x-ray from earlier in the same day. FINDINGS: Gastric catheter is now seen with the tip in the distal stomach. Scattered large and small bowel gas is noted. No obstructive changes are seen. IMPRESSION: Gastric catheter with the tip in the distal stomach. Electronically Signed   By: Inez Catalina M.D.   On: 07/19/2020 22:48

## 2020-07-21 NOTE — Progress Notes (Signed)
NUTRITION NOTE RD working remotely.  Consult received for TF initiation and management. Patient was seen for full initial assessment by another RD in person yesterday.  Orders were placed for Vital 1.5 @ 20 ml/hr to increase by 10 ml every 8 hours to reach goal rate of 55 ml/hr with 90 ml Prosource TF TID.  CCM note from earlier today indicates that green-brown liquid was noted in the ETT and HME.  Orders were subsequently entered for Vital High Protein @ 10 ml/hr with 90 ml Prosource TF TID.  Will change back to Vital 1.5, will order at 10 ml/hr.   CCM to advance per recommendations outlined yesterday when deemed appropriate and RD will complete full follow-up assessment next week.     Ryan Matin, MS, RD, LDN, CNSC Inpatient Clinical Dietitian RD pager # available in [place]  After hours/weekend pager # available in [facility]

## 2020-07-22 DIAGNOSIS — A419 Sepsis, unspecified organism: Secondary | ICD-10-CM | POA: Diagnosis not present

## 2020-07-22 DIAGNOSIS — R6521 Severe sepsis with septic shock: Secondary | ICD-10-CM | POA: Diagnosis not present

## 2020-07-22 DIAGNOSIS — J9600 Acute respiratory failure, unspecified whether with hypoxia or hypercapnia: Secondary | ICD-10-CM | POA: Diagnosis not present

## 2020-07-22 LAB — CBC
HCT: 24.4 % — ABNORMAL LOW (ref 39.0–52.0)
Hemoglobin: 7.6 g/dL — ABNORMAL LOW (ref 13.0–17.0)
MCH: 30.8 pg (ref 26.0–34.0)
MCHC: 31.1 g/dL (ref 30.0–36.0)
MCV: 98.8 fL (ref 80.0–100.0)
Platelets: 95 10*3/uL — ABNORMAL LOW (ref 150–400)
RBC: 2.47 MIL/uL — ABNORMAL LOW (ref 4.22–5.81)
RDW: 25.5 % — ABNORMAL HIGH (ref 11.5–15.5)
WBC: 21.5 10*3/uL — ABNORMAL HIGH (ref 4.0–10.5)
nRBC: 0.4 % — ABNORMAL HIGH (ref 0.0–0.2)

## 2020-07-22 LAB — COMPREHENSIVE METABOLIC PANEL
ALT: 29 U/L (ref 0–44)
AST: 95 U/L — ABNORMAL HIGH (ref 15–41)
Albumin: 2.5 g/dL — ABNORMAL LOW (ref 3.5–5.0)
Alkaline Phosphatase: 178 U/L — ABNORMAL HIGH (ref 38–126)
Anion gap: 12 (ref 5–15)
BUN: 37 mg/dL — ABNORMAL HIGH (ref 6–20)
CO2: 25 mmol/L (ref 22–32)
Calcium: 8.4 mg/dL — ABNORMAL LOW (ref 8.9–10.3)
Chloride: 116 mmol/L — ABNORMAL HIGH (ref 98–111)
Creatinine, Ser: 1.01 mg/dL (ref 0.61–1.24)
GFR, Estimated: >60 mL/min (ref >60)
Glucose, Bld: 142 mg/dL — ABNORMAL HIGH (ref 70–99)
Potassium: 2.6 mmol/L — CL (ref 3.5–5.1)
Sodium: 153 mmol/L — ABNORMAL HIGH (ref 135–145)
Total Bilirubin: 26.2 mg/dL (ref 0.3–1.2)
Total Protein: 5.5 g/dL — ABNORMAL LOW (ref 6.5–8.1)

## 2020-07-22 LAB — GLUCOSE, CAPILLARY
Glucose-Capillary: 108 mg/dL — ABNORMAL HIGH (ref 70–99)
Glucose-Capillary: 132 mg/dL — ABNORMAL HIGH (ref 70–99)
Glucose-Capillary: 136 mg/dL — ABNORMAL HIGH (ref 70–99)
Glucose-Capillary: 140 mg/dL — ABNORMAL HIGH (ref 70–99)
Glucose-Capillary: 143 mg/dL — ABNORMAL HIGH (ref 70–99)
Glucose-Capillary: 163 mg/dL — ABNORMAL HIGH (ref 70–99)

## 2020-07-22 LAB — MAGNESIUM: Magnesium: 2.4 mg/dL (ref 1.7–2.4)

## 2020-07-22 LAB — PROTIME-INR
INR: 1.6 — ABNORMAL HIGH (ref 0.8–1.2)
Prothrombin Time: 18.2 seconds — ABNORMAL HIGH (ref 11.4–15.2)

## 2020-07-22 LAB — POTASSIUM: Potassium: 3.5 mmol/L (ref 3.5–5.1)

## 2020-07-22 LAB — PHOSPHORUS: Phosphorus: 2.9 mg/dL (ref 2.5–4.6)

## 2020-07-22 MED ORDER — VITAMIN D 25 MCG (1000 UNIT) PO TABS
2000.0000 [IU] | ORAL_TABLET | Freq: Every day | ORAL | Status: DC
  Administered 2020-07-22 – 2020-07-24 (×3): 2000 [IU]
  Filled 2020-07-22 (×3): qty 2

## 2020-07-22 MED ORDER — ASCORBIC ACID 500 MG PO TABS
1000.0000 mg | ORAL_TABLET | Freq: Every day | ORAL | Status: DC
  Administered 2020-07-22 – 2020-07-24 (×3): 1000 mg
  Filled 2020-07-22 (×3): qty 2

## 2020-07-22 MED ORDER — POTASSIUM CHLORIDE 10 MEQ/50ML IV SOLN
10.0000 meq | INTRAVENOUS | Status: AC
  Administered 2020-07-22 (×4): 10 meq via INTRAVENOUS
  Filled 2020-07-22 (×4): qty 50

## 2020-07-22 MED ORDER — POTASSIUM CHLORIDE 20 MEQ PO PACK
40.0000 meq | PACK | Freq: Once | ORAL | Status: AC
  Administered 2020-07-22: 40 meq
  Filled 2020-07-22: qty 2

## 2020-07-22 MED ORDER — FREE WATER
200.0000 mL | Freq: Four times a day (QID) | Status: DC
  Administered 2020-07-22 – 2020-07-23 (×5): 200 mL

## 2020-07-22 MED ORDER — FREE WATER: 200.0000 mL | Freq: Four times a day (QID) | Status: DC

## 2020-07-22 NOTE — Progress Notes (Signed)
[facility] Progress Note Patient Name: [name] DOB: 1970/10/21 MRN: [id number]   Date of Service  07/22/2020  HPI/Events of Note  Sodium: 153 (H) Potassium: 2.6 (LL) Chloride: 116 (H) CO2: 25 Glucose: 142 (H) BUN: 37 (H) Creatinine: 1.01 Calcium: 8.4 (L) Anion gap: 12 Phosphorus: 2.9 Magnesium: 2.4 Alkaline Phosphatase: 178 (H) Albumin: 2.5 (L) AST: 95 (H) ALT: 29 Total Protein: 5.5 (L) Total Bilirubin: 26.2 (HH) GFR, Estimated: >60   eICU Interventions  - discussed with RN. Kcl 10 meq q1 hr x 4, Kcl elixir 40 meq via OG.  Making urine.       Intervention Category Intermediate Interventions: Electrolyte abnormality - evaluation and management  Elmer Sow 07/22/2020, 6:40 AM

## 2020-07-22 NOTE — Evaluation (Addendum)
Physical Therapy Evaluation Patient Details Name: [name] MRN: [id number] DOB: 1971/03/25 Today's Date: 07/22/2020   History of Present Illness  49 y.o. male admitted with AMS with encephalopathy and alcholic hepatitis. Intubated in ED on 11/29, extubated and reintubated 12/2. PMHx: alcoholism, HTN, GERD, schizoaffective disorder  Clinical Impression  Pt intubated with FiO2 40% on arrival and initially with delayed response but improved cognition and command following with progressive activity. Pt able to assist with all bed mobility and sitting EOb limited by secretions and fatigue. No family present to provide information for most recent PLOF or assist at D/C. Pt with decreased cognition, mobility, transfers and cardiopulmonary function who will benefit from acute therapy to maximize independence and safety.   SpO2 92%    Follow Up Recommendations LTACH;Supervision/Assistance - 24 hour    Equipment Recommendations  Other (comment) (TBD)    Recommendations for Other Services OT consult     Precautions / Restrictions Precautions Precautions: Other (comment) Precaution Comments: ETT, OG, bil wrist restraints, flexiseal      Mobility  Bed Mobility Overal bed mobility: Needs Assistance Bed Mobility: Rolling;Supine to Sit;Sit to Supine Rolling: Min assist   Supine to sit: Min assist;+2 for safety/equipment Sit to supine: Min assist   General bed mobility comments: pt able to roll bil x 2 with multimodal cues with facilitation to bend knee and reach toward rail. pt transitioned from supine to sit with assist to fully clear legs and lift trunk with assist for lines. Return to bed with assist to bring legs fully up and manage lines. Pt assisting with sliding toward HOB min +2 x 2 trials    Transfers                 General transfer comment: did not attempt  Ambulation/Gait                Stairs            Wheelchair Mobility    Modified Rankin  (Stroke Patients Only)       Balance Overall balance assessment: Needs assistance Sitting-balance support: No upper extremity supported;Feet unsupported Sitting balance-Leahy Scale: Fair Sitting balance - Comments: pt able to sit EOB with guarding for lines and safety grossly 4 min with pt with increased secretions and RN notified for suctioning                                     Pertinent Vitals/Pain Pain Assessment:  (CPOT= 1)    Home Living Family/patient expects to be discharged to:: Private residence Living Arrangements: Alone   Type of Home: Apartment Home Access: Stairs to enter   [employer] of Steps: flight Home Layout: One level        Prior Function           Comments: home setup taken from prior admission and confirmed with RN that pt lived alone in an apartment. Pt currently unable to provide PLOF     Hand Dominance        Extremity/Trunk Assessment   Upper Extremity Assessment Upper Extremity Assessment: Generalized weakness    Lower Extremity Assessment Lower Extremity Assessment: Generalized weakness    Cervical / Trunk Assessment Cervical / Trunk Assessment: Other exceptions Cervical / Trunk Exceptions: forward head, rounded shoulders  Communication   Communication: Other (comment) (ETT)  Cognition Arousal/Alertness: Lethargic Behavior During Therapy: Flat affect Overall Cognitive Status: Difficult to  assess                                 General Comments: Pt intubated with delayed response at times and increased need for cues to attend to movement of RUE      General Comments      Exercises     Assessment/Plan    PT Assessment Patient needs continued PT services  PT Problem List Decreased mobility;Decreased activity tolerance;Decreased balance;Decreased cognition;Cardiopulmonary status limiting activity;Decreased knowledge of use of DME;Decreased coordination;Decreased safety  awareness       PT Treatment Interventions DME instruction;Therapeutic exercise;Gait training;Balance training;Functional mobility training;Cognitive remediation;Therapeutic activities;Patient/family education;Neuromuscular re-education;Stair training    PT Goals (Current goals can be found in the Care Plan section)  Acute Rehab PT Goals PT Goal Formulation: Patient unable to participate in goal setting Time For Goal Achievement: 08/05/20 Potential to Achieve Goals: Fair    Frequency Min 3X/week   Barriers to discharge Decreased caregiver support      Co-evaluation               AM-PAC PT "6 Clicks" Mobility  Outcome Measure Help needed turning from your back to your side while in a flat bed without using bedrails?: A Little Help needed moving from lying on your back to sitting on the side of a flat bed without using bedrails?: A Lot Help needed moving to and from a bed to a chair (including a wheelchair)?: Total Help needed standing up from a chair using your arms (e.g., wheelchair or bedside chair)?: Total Help needed to walk in hospital room?: Total Help needed climbing 3-5 steps with a railing? : Total 6 Click Score: 9    End of Session   Activity Tolerance: Patient tolerated treatment well Patient left: in bed;with call bell/phone within reach;with bed alarm set;with restraints reapplied Nurse Communication: Mobility status PT Visit Diagnosis: Other abnormalities of gait and mobility (R26.89);Difficulty in walking, not elsewhere classified (R26.2)    Time: 3888-2800 PT Time Calculation (min) (ACUTE ONLY): 22 min   Charges:   PT Evaluation $PT Eval High Complexity: 1 High          Madalin Hughart P, PT Acute Rehabilitation Services Pager: [phone] Office: [phone]   Sandy Salaam Jenne Sellinger 07/22/2020, 11:37 AM

## 2020-07-22 NOTE — Progress Notes (Signed)
Critical Value:  K 2.7  Date and Time Notified: 12/5 [place] Notified  Hart Rochester, RN

## 2020-07-22 NOTE — Progress Notes (Addendum)
NAME:  [name], MRN:  [id number], DOB:  10/26/1970, LOS: 6 ADMISSION DATE:  07/16/2020, CONSULTATION DATE:  07/22/20  REFERRING MD:  Gareth Morgan, CHIEF COMPLAINT:  Seizure   Brief History   [name] a 49 year old male with past medical history notable for EtOH use disorder, hepatic steatosis, essential hypertension, GERD, schizoaffective disorder presents after found down at home for concern for seizures, patient intubated in ED for for airway protection due to depressed consciousness.   History of present illness   Ryan McGintyis a 49 year old male with past medical history notable for EtOH use disorder, hepatic steatosis, essential hypertension, GERD, schizoaffective disorder presents after found down at home during a wellness check. History is limited given patient's level of consciousness. Per chart review patient was found at home with multiple cans of alcohol around him. Mother reported that patient states he was attempting to withdraw from alcohol a couple days ago. Sister last saw the patient 5-6 days ago at that time he was more jaundices and moving slower than at baseline.   In ED patient with decreased conciseness and seen to have jerking motions concerning for seizures. He was intubated with EEG ordered. Found to have  Elevated AST 207, ALT 68, Bili 21, Cr. 1.68. K 2.6, Mg 1.3,  albumin 1.8. Lactate of 9.6.  CBC with WBC of 14.2, hbg 11.5 improved from last admission of 8, plt 156. INR 1.6. Ammonia of 207. EKG with QT prolongation.   Past Medical History  He,  has a past medical history of Alcohol abuse, Anxiety, Depression, GERD (gastroesophageal reflux disease), Hypertension, Hepatic steatosis, Psychosis ([place]), PTSD (post-traumatic stress disorder), Schizoaffective disorder, Seizure disorder ([place]), and Symptomatic anemia.   Significant Hospital Events   11/29 Admission. MEDDRY SCORE 34 and started  solumedrol  12/2 extubated, reintubated.   12/3 -  Re-intubated overnight, and there is suspicion for aspiration  Disconnected self from vent multiple times overnight due to agitation/confusion, with subsequent desaturations. Improved when reconnected to vent.  Left on FiO2 100% and PEEP 15 overnight for post-intubation pao2 50. SpO2 98% this morning, vent settings weaned to FiO2 60% and PEEP 10, however follow up ABG with PaO2 47 prompting escalation in FiO2.  There is now green/brown liquid in ETT and HME, but scant secretions when suctioned. MELD score - 27 points   12/4 - dense left sided pneumonia. On 90% fio2 / 10 peep -> reduced to 60% fio2 -> pulse ox 93%. On precedex gtt. On levophed gtt 55mcg . Febrile yesterday. Making urine. Very jaundiced., More awake. Vomiting subsised   Consults:  Neurology  Procedures:  11/29 Intubation- 12/2.  12/2>> 12/1 PICC  Significant Diagnostic Tests:  CT head 11/29 > no acute 11/29 RUQ US> small volume ascites. Hepatic cirrhosis, likely fatty infiltration os liver, features suggesting portal venous hypertension GB sludge without visible cholelithiasis. Mild GB thickening, nonspecific.  CXR 12/2> Diminished lung volumes Bilateral ASD L>R, ETT 2.4cm above carina  Micro Data:  11/29 Blood cultures >> 11/3-0 MRSA - neg /11/30 SARS cov2 - neg 11/30 flu pcr  - neg 11/30 Tracheal Asp > few staph aureus . MSSA 11/29 UCx> 1000 col E.Coli   Antimicrobials:  11/29 Vancomycin > 12/1 11/29 Flagyl x 1 dose 11/29 Cefepime >12/3 12/3 - cefazolin (MSSA sputum and e colii uti)  xxxx  12/3 - xifaxan with lactulose   Interim history/subjective:   12/5 -  60% fio2/10 peep on vent -> pulse ox 97%. On precdex gtt, vasopressin gtt, levophed gtt.  PRessor needs better NA 153 and K very low. Creat improved. BUN worse 30s. Still febrile and has significant resp secretions per RT. Making urine . Plat improving. Bilirubin worse.   Per APP - MAP gioal > 75 associated with better Ur OP  MELD score 24 and  better  Objective   Blood pressure 116/68, pulse 74, temperature 99.3 F (37.4 C), resp. rate (!) 27, height 6' (1.829 m), weight 102.2 kg, SpO2 97 %.    Vent Mode: PRVC FiO2 (%):  [60 %-80 %] 60 % Set Rate:  [23 bmp] 23 bmp Vt Set:  [460 mL] 460 mL PEEP:  [10 cmH20] 10 cmH20 Plateau Pressure:  [18 cmH20-23 cmH20] 23 cmH20   Intake/Output Summary (Last 24 hours) at 07/22/2020 0853 Last data filed at 07/22/2020 0800 Gross per 24 hour  Intake 1587.12 ml  Output 2235 ml  Net -647.88 ml   Filed Weights   07/16/20 1615 07/21/20 0456 07/22/20 0500  Weight: 98.4 kg 102.9 kg 102.2 kg    General Appearance:  Looks criticall ill OBESE - +. JAUNDICE Head:  Normocephalic, without obvious abnormality, atraumatic Eyes:  PERRL - JAUJNDICE, conjunctiva/corneas - JAUNDICE     Ears:  Normal external ear canals, both ears Nose:  G tube - no Throat:  ETT TUBE - yes , OG tube - yes Neck:  Supple,  No enlargement/tenderness/nodules Lungs: Clear to auscultation bilaterally, Ventilator   Synchrony - yes Heart:  S1 and S2 normal, no murmur, CVP - no.  Pressors - yes levophed Abdomen:  Soft, no masses, no organomegaly, ASCITES + Genitalia / Rectal:  Not done Extremities:  Extremities- intact with deeema Skin:  ntact in exposed areas . Sacral area - not examined Neurologic:  Sedation - precedex gtt -> RASS - 0 to -1 . Moves all 4s - yes. CAM-ICU - unable to assess . Orientation - awakens and starting to follow commands      Resolved Hospital Problem list    Prolonged QTc 626 msec 07/17/20 - resolved on EKG 07/21/20   Assessment & Plan:   Acute respiratory failure with hypoxia requiring re-intubation -initially intubated for resp insufficiency 2/2 HE. extubated 12/2, progressively more hypoxic and dyspneic, reintubated 12/2 Staph PNA - dense LUL consolidation Aspiration PNA - possibly -green/brown liquid in ETT and vent circuit 12/2-12/3 but this could have been resp secretions with his  jaundice   07/22/2020 - > does not meet criteria for SBT/Extubation in setting of Acute Respiratory Failure due to high vent needs, encephalopathy  And septic shock though fio2 needs improev   P PRVC No SBT/Extubation  VAP bundle Titrate fio2 down to 40% and then peep - > goal pusle ox > 88% Might need trach g  EtOH abuse Hx  EtOH withdrawal with seizure  Acute hepatic encephalopathy due to Hyperammonemia -UDS +Benzo and THC Presented with decreased conciousness with possible seizure activity. Elevated ammonia of 204. Normal ethanol levels -EEG without evidence of sz   07/22/2020 - more awake on prcedex gtt, xifaxn and lactulose and improving  P - Continue lactulose 30 mg every 8 hours -rifaximin  - Seizure percautions -Precedex gtt - fent prn  - versed prn -Thiamin, multivitamins  -seizure precautions   - RASS goal 0 to -2 (he is on target)  Alcoholic hepatitis at presentation elevated LFTs AST>ALT c/w alcohol use. Maddrey Score of 34. Negative for acetaminophen, salicylate, and ethanol on admission  Hyperbilirubinemia and Suspected portal venous hypertension - Findings on RUQ [place]-- small vol  ascites, recanalization of umbilical vein, diminished velocities   07/22/2020 -Lille score 0.956 on Day 6 suggesting non-responder   Plan - methylprednisolone 32 mg daily since 07/16/20 Check Lille score on day 7 =  07/23/20 to assess benefit - and discuss prognsosi   Septic shock due to Staph PNA and aspiration PNA and E colii UTI - s/p albumin  07/22/2020 - on levophed and vasopresson. Needs improving. PEr APP 12/4 - MAP goal > 75 with better ur op . fEbrile again last night   P  -Continue NE and vasopressor  - wean as able for MAP > 75  - continue cefazolin - await tracheal aspirate recheck 07/21/20    AKI - -with findings suggestive of portal venous HTN on RUQ [place], cirrhosis-- concerning for possible mild HRS. S/p albumin + NE   07/22/2020 - improving  P -trend renal  indices, strict I/O  -given improving Cr and UOP, - target map >75     Anemia  07/22/2020 -0 no active bleed   P - - PRBC for hgb </= 6.9gm%    - exceptions are   -  if ACS susepcted/confirmed then transfuse for hgb </= 8.0gm%,  or    -  active bleeding with hemodynamic instability, then transfuse regardless of hemoglobin value   At at all times try to transfuse 1 unit prbc as possible with exception of active hemorrhage  THrombocytopenia  07/22/2020 -platelet 95 and better  Plan  - monitor with SQ hepain  HTN P -holding antihypertensives in setting of shock  Schizoaffective Disorder - Hold home Seroquel and Zoloft in setting of Qt prolongation 11/29 and delirium  Best practice (evaluated daily)  Diet: NPO - will rechallenge TF 07/21/20 (secretions might be resp that is containing bilirubin) Pain/Anxiety/Delirium protocol (if indicated): Fent, precedex. Prn versed  VAP protocol (if indicated): Yes DVT prophylaxis: Heparin  GI prophylaxis: Pantoprazole  Glucose control: None Mobility: Bed rest Code Status: Full Disposition: ICU Family updates: sister Aleks Nawrot called 07/21/20 on cell [place]   MDT Goals of Care Discussion   Date of Discussion 07/20/20 which is 4th day 07/22/20 - day 6  Primary service for patient CCM CCM  Location of discussion bedside Bedside but phone conf call  Family and Staff present Patient sister, mother, RN and CCM APP - [place] and Margarita Grizzle and Mom with MD - Dr Chase Caller and RN Jarrett Soho  Summary of discussion Updated and contnue current active cae Updated. Family wish for patient to live and make it home. They plan for his post recovery support. Wish for full medical care xxxx Code status - discussed.They are contemplanting   Followup goals of care due by 07/27/20 07/29/20  Misc comments if any  They will make decision on code status 07/22/20          ATTESTATION & SIGNATURE   The patient Eliyohu Class is critically ill with multiple organ systems failure and requires high complexity decision making for assessment and support, frequent evaluation and titration of therapies, application of advanced monitoring technologies and extensive interpretation of multiple databases.   Critical Care Time devoted to patient care services described in this note is  45  Minutes. This time reflects time of care of this signee Dr Brand Males. This critical care time does not reflect procedure time, or teaching time or supervisory time of PA/NP/Med student/Med Resident etc but could involve care discussion time     Dr. Brand Males, M.D.,  F.C.C.P Pulmonary and Critical Care Medicine Staff Physician [facility] Pager: [phone], If no answer or between  15:00h - 7:00h: call [phone]  07/22/2020 9:35 AM    LABS    PULMONARY Recent Labs  Lab 07/16/20 1536 07/16/20 1720 07/19/20 2304 07/20/20 0907 07/20/20 0920  PHART  --  7.542* 7.393 7.541* 7.556*  PCO2ART  --  47.5 50.3* 34.7 32.6  PO2ART  --  358* 88 30* 47*  HCO3  --  40.9* 30.7* 29.8* 28.9*  TCO2 35* 42* 32 31 30  O2SAT  --  100.0 96.0 67.0 89.0    CBC Recent Labs  Lab 07/20/20 0758 07/20/20 0907 07/20/20 0920 07/21/20 0648 07/22/20 0516  HGB 7.5*   < > 8.2* 8.1* 7.6*  HCT 22.5*   < > 24.0* 24.2* 24.4*  WBC 21.3*  --   --  19.5* 21.5*  PLT 94*  --   --  86* 95*   < > = values in this interval not displayed.    COAGULATION Recent Labs  Lab 07/16/20 1541 07/17/20 0059 07/20/20 1230 07/22/20 0516  INR 1.6* 1.5* 1.9* 1.6*    CARDIAC  No results for input(s): TROPONINI in the last 168 hours. No results for input(s): PROBNP in the last 168 hours.   CHEMISTRY Recent Labs  Lab 07/18/20 1503 07/18/20 1503 07/19/20 0600 07/19/20 1700 07/19/20 2304 07/20/20 0500 07/20/20 0907 07/20/20 0907 07/20/20 0920 07/20/20 0920 07/20/20 1123 07/20/20 1123  07/20/20 1648 07/21/20 0648 07/22/20 0516  NA 142   < > 141  --    < >  --  145  --  149*  --  146*  --   --  149* 153*  K 2.7*   < > 2.8*  --    < >  --  2.8*   < > 2.7*   < > 2.8*   < >  --  2.5* 2.6*  CL 99  --  101  --   --   --   --   --   --   --  107  --   --  108 116*  CO2 27  --  24  --   --   --   --   --   --   --  24  --   --  26 25  GLUCOSE 161*  --  238*  --   --   --   --   --   --   --  188*  --   --  184* 142*  BUN 25*  --  24*  --   --   --   --   --   --   --  26*  --   --  34* 37*  CREATININE 1.69*  --  1.40*  --   --   --   --   --   --   --  1.21  --   --  1.22 1.01  CALCIUM 6.8*  --  7.7*  --   --   --   --   --   --   --  7.7*  --   --  8.1* 8.4*  MG 2.2   < > 2.3 1.9  --  1.9  --   --   --   --   --   --  2.7*  --  2.4  PHOS 1.9*   < > 2.4* 2.5  --  2.9  --   --   --   --   --   --  2.5  --  2.9   < > = values in this interval not displayed.   Estimated Creatinine Clearance: 111.8 mL/min (by C-G formula based on SCr of 1.01 mg/dL).   LIVER Recent Labs  Lab 07/16/20 1541 07/17/20 0059 07/17/20 1102 07/18/20 0341 07/19/20 0600 07/20/20 1123 07/20/20 1230 07/21/20 0648 07/22/20 0516  AST 207*  --    < > 130* 105* 87*  --  89* 95*  ALT 68*  --    < > 39 35 32  --  31 29  ALKPHOS 265*  --    < > 153* 177* 153*  --  176* 178*  BILITOT 21.1*  --    < > 17.0* 19.4* 21.6*  --  24.8* 26.2*  PROT 6.3*  --    < > 5.5* 5.8* 5.2*  --  5.6* 5.5*  ALBUMIN 1.8*  --    < > 2.4* 3.0* 2.7*  --  2.7* 2.5*  INR 1.6* 1.5*  --   --   --   --  1.9*  --  1.6*   < > = values in this interval not displayed.     INFECTIOUS Recent Labs  Lab 07/16/20 2100 07/16/20 2100 07/17/20 1102 07/17/20 1516 07/18/20 0341 07/18/20 1503 07/20/20 1230  LATICACIDVEN 7.7*   < >  --  2.6*  --  2.5* 1.8  PROCALCITON 0.75  --  1.30  --  1.54  --   --    < > = values in this interval not displayed.     ENDOCRINE CBG (last 3)  Recent Labs    07/21/20 2349 07/22/20 0355  07/22/20 0748  GLUCAP 143* 108* 132*         IMAGING x48h  - image(s) personally visualized  -   highlighted in bold DG CHEST PORT 1 VIEW  Result Date: 07/21/2020 CLINICAL DATA:  Aspiration pneumonia.  Follow-up exam EXAM: PORTABLE CHEST 1 VIEW COMPARISON:  07/19/2020 FINDINGS: Bilateral airspace lung opacities are noted, most confluent in the left mid to upper lung, similar to the most recent prior exam allowing for larger lung volumes on the current study. No new lung abnormalities. No convincing pleural effusion and no pneumothorax. Endotracheal tube tip now projects 4 cm above the carina. Naso/orogastric tube passes below the diaphragm well into the stomach, below the included field of view. Right PICC tip projects in the lower superior vena cava. IMPRESSION: 1. No significant change in lung aeration from the most recent prior study. Persistent, left greater than right, airspace lung opacities. 2. Well-positioned support apparatus. Electronically Signed   By: Lajean Manes M.D.   On: 07/21/2020 09:43

## 2020-07-23 ENCOUNTER — Inpatient Hospital Stay (HOSPITAL_COMMUNITY): Payer: Medicare Other

## 2020-07-23 DIAGNOSIS — K7010 Alcoholic hepatitis without ascites: Secondary | ICD-10-CM | POA: Diagnosis not present

## 2020-07-23 DIAGNOSIS — K7290 Hepatic failure, unspecified without coma: Secondary | ICD-10-CM | POA: Diagnosis not present

## 2020-07-23 DIAGNOSIS — R17 Unspecified jaundice: Secondary | ICD-10-CM | POA: Diagnosis not present

## 2020-07-23 DIAGNOSIS — Z01818 Encounter for other preprocedural examination: Secondary | ICD-10-CM

## 2020-07-23 LAB — BASIC METABOLIC PANEL
Anion gap: 10 (ref 5–15)
BUN: 34 mg/dL — ABNORMAL HIGH (ref 6–20)
CO2: 23 mmol/L (ref 22–32)
Calcium: 8.6 mg/dL — ABNORMAL LOW (ref 8.9–10.3)
Chloride: 120 mmol/L — ABNORMAL HIGH (ref 98–111)
Creatinine, Ser: 0.87 mg/dL (ref 0.61–1.24)
GFR, Estimated: >60 mL/min (ref >60)
Glucose, Bld: 179 mg/dL — ABNORMAL HIGH (ref 70–99)
Potassium: 3 mmol/L — ABNORMAL LOW (ref 3.5–5.1)
Sodium: 153 mmol/L — ABNORMAL HIGH (ref 135–145)

## 2020-07-23 LAB — CULTURE, RESPIRATORY W GRAM STAIN: Culture: NO GROWTH

## 2020-07-23 LAB — GLUCOSE, CAPILLARY
Glucose-Capillary: 148 mg/dL — ABNORMAL HIGH (ref 70–99)
Glucose-Capillary: 135 mg/dL — ABNORMAL HIGH (ref 70–99)
Glucose-Capillary: 143 mg/dL — ABNORMAL HIGH (ref 70–99)
Glucose-Capillary: 167 mg/dL — ABNORMAL HIGH (ref 70–99)
Glucose-Capillary: 137 mg/dL — ABNORMAL HIGH (ref 70–99)
Glucose-Capillary: 149 mg/dL — ABNORMAL HIGH (ref 70–99)

## 2020-07-23 LAB — PHOSPHORUS: Phosphorus: 1.9 mg/dL — ABNORMAL LOW (ref 2.5–4.6)

## 2020-07-23 MED ORDER — VITAL 1.5 CAL PO LIQD
1000.0000 mL | ORAL | Status: DC
  Filled 2020-07-23 (×2): qty 1000

## 2020-07-23 MED ORDER — DOCUSATE SODIUM 50 MG/5ML PO LIQD: 100.0000 mg | Freq: Two times a day (BID) | ORAL | Status: DC | PRN

## 2020-07-23 MED ORDER — POTASSIUM CHLORIDE 20 MEQ PO PACK
40.0000 meq | PACK | Freq: Once | ORAL | Status: AC
  Administered 2020-07-23: 40 meq
  Filled 2020-07-23: qty 2

## 2020-07-23 MED ORDER — FREE WATER
200.0000 mL | Status: DC
  Administered 2020-07-23: 200 mL

## 2020-07-23 MED ORDER — FREE WATER
300.0000 mL | Status: DC
  Administered 2020-07-23 – 2020-07-24 (×6): 300 mL

## 2020-07-23 MED ORDER — LACTULOSE 10 GM/15ML PO SOLN
30.0000 g | Freq: Two times a day (BID) | ORAL | Status: DC
  Administered 2020-07-23 – 2020-07-24 (×2): 30 g
  Filled 2020-07-23 (×2): qty 45

## 2020-07-23 MED ORDER — POTASSIUM PHOSPHATES 15 MMOLE/5ML IV SOLN
30.0000 mmol | Freq: Once | INTRAVENOUS | Status: AC
  Administered 2020-07-23: 30 mmol via INTRAVENOUS
  Filled 2020-07-23: qty 10

## 2020-07-23 MED ORDER — POLYETHYLENE GLYCOL 3350 17 G PO PACK: 17.0000 g | PACK | Freq: Every day | ORAL | Status: DC | PRN

## 2020-07-23 NOTE — Consult Note (Signed)
Referring Provider:  [facility]         Primary Care Physician:  Nicolette Bang, DO Primary Gastroenterologist: unassigned            We were asked to see this patient for:   cirrhosis               ASSESSMENT / PLAN:    # 49 yo male with cirrhosis complicated by portal HTN ( MELD 26) and recurrent Etoh hepatitis.  Obtunded on arrival. Bilirubin is 26. INR 1.6. On IV steroids for MDF > 32.  --Continue BID lactulose. He is awake, following commands. The dose of lactulose was recently reduced due to large amount of stool. We can always add Xifaxan when extubated taking PO.  --Only small volume ascites on [place] but on Ancef which should cover for SBP --Platelets are < 100, he is getting SQ heparin. Monitor for GI bleeding --GI will follow.   # Septic shock due to staph PNA / aspiration, E.coli UTI.  He is still on pressors and intubated but awake and following commands .On ancef  # Prolonged QTc  # Hypernatremia, NA 153. Getting free water flushes  # chronic [place] anemia, likely secondary to cirrhosis, maybe bone marrow suppression from Etoh.      HPI:                                                                                                                             Chief Complaint: cirrhosis  [name] is a 49 y.o. male with a pmh significant for, not necessarily limited to: Etoh abuse, depression, anxiety, PTSD, schizoaffective disorder, GERD, HTN.   Patient admitted in September with Etoh hepatitis, Etoh withdrawal, worsening anemia and intermittent melena. He has had multiple hospitalizations for Etoh related issues. Patient brought by EMS to ED on 11/29. He was unresponsive when EMS arrived. He had apparently been trying to stop Etoh for a couple of days. In ED patient was intubated for airway protection.  He was jaundiced with bilirubin of 21, K+ 2.6, AKI with creatinine of 1.68,  lactic acid level was 9.6, WBC 14. Neurology evaluated. Head CT scan  without acute findings. MDF was 34, he was given methylprednisolone, started on lactulose. Acetominophen level undetectable.    Patient's EEG suggestive of severe diffuse encephalopathy, nonspecific but could be secondary to sedation. No seizures recorded. He remains intubated on pressors. CXR on 12/4 remarkable for left > right airspace lung opacities. He is awake, follows commands.   PREVIOUS ENDOSCOPIC EVALUATIONS / PERTINENT STUDIES   05/05/20 EGD to evaluate anemia and melena -grade I varices -Portal hypertensive gastropathy. - Erythematous duodenopathy. - The examination was otherwise normal. - No specimens collected.    Past Medical History:  Diagnosis Date  . Alcohol abuse   . Anxiety    at age 45  . Depression    at age 21  . GERD (gastroesophageal reflux disease)   .  Hypertension   . Psychosis ([place])   . PTSD (post-traumatic stress disorder)   . Schizoaffective disorder   . Seizure disorder ([place])    related to etoh seizure  . Symptomatic anemia     Past Surgical History:  Procedure Laterality Date  . ESOPHAGOGASTRODUODENOSCOPY (EGD) WITH PROPOFOL N/A 05/05/2020   Procedure: ESOPHAGOGASTRODUODENOSCOPY (EGD) WITH PROPOFOL;  Surgeon: [name], MD;  Location: [facility] ENDOSCOPY;  Service: Endoscopy;  Laterality: N/A;  . FOOT SURGERY     right  . HAND SURGERY      Prior to Admission medications   Medication Sig Start Date End Date Taking? Authorizing Provider  allopurinol (ZYLOPRIM) 100 MG tablet Take 2 tablets (200 mg total) by mouth daily. 04/20/20   Nicolette Bang, DO  amLODipine (NORVASC) 10 MG tablet Take 10 mg by mouth daily.    [provider]  loperamide (IMODIUM) 2 MG capsule Take 4 mg by mouth as needed for diarrhea or loose stools.    [provider]  metoprolol tartrate (LOPRESSOR) 25 MG tablet Take 0.5 tablets (12.5 mg total) by mouth 2 (two) times daily. 07/11/20 08/10/20  Nicolette Bang, DO  MILK THISTLE PO  Take 1 capsule by mouth daily.    [provider]  Misc. Devices MISC Blood Pressure monitor. Dx :Hypertension 06/09/19   [name], MD  Multiple Vitamin (MULTIVITAMIN WITH MINERALS) TABS tablet Take 1 tablet by mouth daily. 05/08/20   [place] Eric J, DO  pantoprazole (PROTONIX) 40 MG tablet Take 1 tablet (40 mg total) by mouth daily. 07/11/20 08/10/20  Nicolette Bang, DO  QUEtiapine (SEROQUEL) 50 MG tablet Take 1 tablet (50 mg total) by mouth at bedtime. 06/06/20 07/06/20  Nicolette Bang, DO  sertraline (ZOLOFT) 25 MG tablet Take 1 tablet (25 mg total) by mouth daily. 07/11/20   Nicolette Bang, DO    Current Facility-Administered Medications  Medication Dose Route Frequency Provider Last Rate Last Admin  . 0.9 %  sodium chloride infusion   Intravenous PRN Kipp Brood, MD   Stopped at 07/17/20 1958  . ascorbic acid (VITAMIN C) tablet 1,000 mg  1,000 mg Per Tube Daily Brand Males, MD   1,000 mg at 07/23/20 0930  . ceFAZolin (ANCEF) IVPB 2g/100 mL premix  2 g Intravenous Q8H Merlene Laughter F, NP   Stopped at 07/23/20 1347  . chlorhexidine gluconate (MEDLINE KIT) (PERIDEX) 0.12 % solution 15 mL  15 mL Mouth Rinse BID Freddi Starr, MD   15 mL at 07/23/20 0816  . Chlorhexidine Gluconate Cloth 2 % PADS 6 each  6 each Topical Daily Freddi Starr, MD   6 each at 07/23/20 0230  . cholecalciferol (VITAMIN D3) tablet 2,000 Units  2,000 Units Per Tube Daily Brand Males, MD   2,000 Units at 07/23/20 0931  . dexmedetomidine (PRECEDEX) 400 MCG/100ML (4 mcg/mL) infusion  0.4-1.6 mcg/kg/hr Intravenous Titrated Anders Simmonds, MD 14.76 mL/hr at 07/23/20 1400 0.6 mcg/kg/hr at 07/23/20 1400  . docusate (COLACE) 50 MG/5ML liquid 100 mg  100 mg Per Tube BID PRN Merlene Laughter F, NP      . feeding supplement (PROSource TF) liquid 90 mL  90 mL Per Tube TID Kipp Brood, MD   90 mL at 07/23/20 1523  . feeding supplement (VITAL 1.5 CAL) liquid  1,000 mL  1,000 mL Per Tube Continuous Audria Nine, DO 25 mL/hr at 07/23/20 1545 Rate Change at 07/23/20 1545  . fentaNYL (SUBLIMAZE) injection 25-100 mcg  25-100  mcg Intravenous Q2H PRN Brand Males, MD   50 mcg at 07/23/20 1253  . folic acid (FOLVITE) tablet 1 mg  1 mg Per Tube Daily Iona Beard, MD   1 mg at 07/23/20 0931  . free water 300 mL  300 mL Per Tube Q4H Merlene Laughter F, NP   300 mL at 07/23/20 1544  . heparin injection 5,000 Units  5,000 Units Subcutaneous Q8H Iona Beard, MD   5,000 Units at 07/23/20 1313  . insulin aspart (novoLOG) injection 0-15 Units  0-15 Units Subcutaneous Q4H Kipp Brood, MD   2 Units at 07/23/20 1203  . ipratropium-albuterol (DUONEB) 0.5-2.5 (3) MG/3ML nebulizer solution 3 mL  3 mL Nebulization TID Kipp Brood, MD   3 mL at 07/23/20 0840  . lactulose (CHRONULAC) 10 GM/15ML solution 30 g  30 g Per Tube BID Merlene Laughter F, NP      . MEDLINE mouth rinse  15 mL Mouth Rinse 10 times per day Freddi Starr, MD   15 mL at 07/23/20 1544  . methylPREDNISolone sodium succinate (SOLU-MEDROL) 40 mg/mL injection 32 mg  32 mg Intravenous Daily Freddi Starr, MD   32 mg at 07/23/20 0931  . midazolam (VERSED) injection 1-4 mg  1-4 mg Intravenous Q2H PRN Donnamae Jude, [facility]      . multivitamin with minerals tablet 1 tablet  1 tablet Per Tube Daily Kipp Brood, MD   1 tablet at 07/23/20 0931  . norepinephrine (LEVOPHED) 56m in 2568mpremix infusion  0-40 mcg/min Intravenous Titrated BoCristal GenerousNP 15 mL/hr at 07/23/20 1400 4 mcg/min at 07/23/20 1400  . pantoprazole sodium (PROTONIX) 40 mg/20 mL oral suspension 40 mg  40 mg Per Tube Daily Agarwala, RaEinar GradMD   40 mg at 07/23/20 0930  . polyethylene glycol (MIRALAX / GLYCOLAX) packet 17 g  17 g Per Tube Daily PRN DaMerlene Laughter, NP      . potassium PHOSPHATE 30 mmol in dextrose 5 % 500 mL infusion  30 mmol Intravenous Once DaMerlene Laughter, NP 85 mL/hr at 07/23/20 1203 30 mmol at 07/23/20  1203  . rifaximin (XIFAXAN) tablet 550 mg  550 mg Per Tube BID BoCristal GenerousNP   550 mg at 07/23/20 0931  . sodium chloride flush (NS) 0.9 % injection 10-40 mL  10-40 mL Intracatheter Q12H AgKipp BroodMD   10 mL at 07/22/20 2146  . sodium chloride flush (NS) 0.9 % injection 10-40 mL  10-40 mL Intracatheter PRN Agarwala, RaEinar GradMD      . sodium chloride flush (NS) 0.9 % injection 3 mL  3 mL Intravenous Q12H LiIona BeardMD   3 mL at 07/22/20 2146  . thiamine tablet 100 mg  100 mg Per Tube Daily Agarwala, RaEinar GradMD   100 mg at 07/23/20 0931  . vasopressin (PITRESSIN) 20 Units in sodium chloride 0.9 % 100 mL infusion-*FOR SHOCK*  0-0.03 Units/min Intravenous Continuous RaBrand MalesMD 9 mL/hr at 07/23/20 1400 0.03 Units/min at 07/23/20 1400    Allergies as of 07/16/2020 - Review Complete 05/28/2020  Allergen Reaction Noted  . Wellbutrin [bupropion] Other (See Comments) 10/18/2014    Family History  Problem Relation Age of Onset  . Alcoholism Mother   . CAD Father   . Depression Brother     Social History   Socioeconomic History  . Marital status: Divorced    Spouse name: Not on file  . Number of children: Not on file  .  Years of education: Not on file  . Highest education level: Not on file  Occupational History  . Not on file  Tobacco Use  . Smoking status: Current Every Day Smoker    Packs/day: 1.50    Years: 30.00    Pack years: 45.00    Types: Cigarettes  . Smokeless tobacco: Never Used  Vaping Use  . Vaping Use: Every day  Substance and Sexual Activity  . Alcohol use: Yes    Alcohol/week: 0.0 standard drinks    Comment: drinking daily   . Drug use: Yes    Frequency: 1.0 times per week    Types: Marijuana    Comment: THC 2 to 3 times per month. last use 09/24/2015  . Sexual activity: Yes    Birth control/protection: Condom  Other Topics Concern  . Not on file  Social History Narrative  . Not on file   Social Determinants of Health   Financial  Resource Strain:   . Difficulty of Paying Living Expenses: Not on file  Food Insecurity: No Food Insecurity  . Worried About Charity fundraiser in the Last Year: Never true  . Ran Out of Food in the Last Year: Never true  Transportation Needs: Unmet Transportation Needs  . Lack of Transportation (Medical): Yes  . Lack of Transportation (Non-Medical): Yes  Physical Activity:   . Days of Exercise per Week: Not on file  . Minutes of Exercise per Session: Not on file  Stress:   . Feeling of Stress : Not on file  Social Connections:   . Frequency of Communication with Friends and Family: Not on file  . Frequency of Social Gatherings with Friends and Family: Not on file  . Attends Religious Services: Not on file  . Active Member of Clubs or Organizations: Not on file  . Attends Archivist Meetings: Not on file  . Marital Status: Not on file  Intimate Partner Violence:   . Fear of Current or Ex-Partner: Not on file  . Emotionally Abused: Not on file  . Physically Abused: Not on file  . Sexually Abused: Not on file    Review of Systems: Unobtainable   OBJECTIVE:    Physical Exam: Vital signs in last 24 hours: Temp:  [96.6 F (35.9 C)-99.7 F (37.6 C)] 98.8 F (37.1 C) (12/06 1515) Pulse Rate:  [53-80] 65 (12/06 1515) Resp:  [11-28] 20 (12/06 1515) BP: (86-136)/(61-85) 122/78 (12/06 1515) SpO2:  [93 %-99 %] 97 % (12/06 1515) FiO2 (%):  [40 %] 40 % (12/06 1155) Weight:  [98.5 kg] 98.5 kg (12/06 0212) Last BM Date: 07/22/20 General:   Alert male in NAD. In soft restraints Psych:cooperative.  Eyes:  Pupils equal, sclera clear, no icterus.   Conjunctiva pink. Ears:  Normal auditory acuity. Nose:  No deformity, discharge,  or lesions. Lungs:     No wheezes, crackles, or rhonchi in chest  Heart:  Regular rate and rhythm, no lower extremity edema Abdomen:  Soft, non-distended, nontender, BS active  Rectal:  Deferred  Msk:  Symmetrical without gross deformities.  . Neurologic:  Awake Skin:  Intact without significant lesions or rashes.  Filed Weights   07/21/20 0456 07/22/20 0500 07/23/20 0212  Weight: 102.9 kg 102.2 kg 98.5 kg     Scheduled inpatient medications . vitamin C  1,000 mg Per Tube Daily  . chlorhexidine gluconate (MEDLINE KIT)  15 mL Mouth Rinse BID  . Chlorhexidine Gluconate Cloth  6 each Topical Daily  .  cholecalciferol  2,000 Units Per Tube Daily  . feeding supplement (PROSource TF)  90 mL Per Tube TID  . folic acid  1 mg Per Tube Daily  . free water  300 mL Per Tube Q4H  . heparin injection (subcutaneous)  5,000 Units Subcutaneous Q8H  . insulin aspart  0-15 Units Subcutaneous Q4H  . ipratropium-albuterol  3 mL Nebulization TID  . lactulose  30 g Per Tube BID  . mouth rinse  15 mL Mouth Rinse 10 times per day  . methylPREDNISolone (SOLU-MEDROL) injection  32 mg Intravenous Daily  . multivitamin with minerals  1 tablet Per Tube Daily  . pantoprazole sodium  40 mg Per Tube Daily  . rifaximin  550 mg Per Tube BID  . sodium chloride flush  10-40 mL Intracatheter Q12H  . sodium chloride flush  3 mL Intravenous Q12H  . thiamine  100 mg Per Tube Daily      Intake/Output from previous day: 12/05 0701 - 12/06 0700 In: 1949.9 [I.V.:983.1; NG/GT:382.8; IV Piggyback:584] Out: 2860 [Urine:1360; Stool:1500] Intake/Output this shift: Total I/O In: 391.6 [I.V.:275.5; IV Piggyback:116.1] Out: 325 [Urine:325]   Lab Results: Recent Labs    07/21/20 0648 07/22/20 0516  WBC 19.5* 21.5*  HGB 8.1* 7.6*  HCT 24.2* 24.4*  PLT 86* 95*   BMET Recent Labs    07/21/20 0648 07/21/20 0648 07/22/20 0516 07/22/20 1312 07/23/20 1220  NA 149*  --  153*  --  153*  K 2.5*   < > 2.6* 3.5 3.0*  CL 108  --  116*  --  120*  CO2 26  --  25  --  23  GLUCOSE 184*  --  142*  --  179*  BUN 34*  --  37*  --  34*  CREATININE 1.22  --  1.01  --  0.87  CALCIUM 8.1*  --  8.4*  --  8.6*   < > = values in this interval not displayed.    LFT Recent Labs    07/22/20 0516  PROT 5.5*  ALBUMIN 2.5*  AST 95*  ALT 29  ALKPHOS 178*  BILITOT 26.2*   PT/INR Recent Labs    07/22/20 0516  LABPROT 18.2*  INR 1.6*   Hepatitis Panel No results for input(s): HEPBSAG, HCVAB, HEPAIGM, HEPBIGM in the last 72 hours.   . CBC Latest Ref Rng & Units 07/22/2020 07/21/2020 07/20/2020  WBC 4.0 - 10.5 K/uL 21.5(H) 19.5(H) -  Hemoglobin 13.0 - 17.0 g/dL 7.6(L) 8.1(L) 8.2(L)  Hematocrit 39 - 52 % 24.4(L) 24.2(L) 24.0(L)  Platelets 150 - 400 K/uL 95(L) 86(L) -    . CMP Latest Ref Rng & Units 07/23/2020 07/22/2020 07/22/2020  Glucose 70 - 99 mg/dL 179(H) - 142(H)  BUN 6 - 20 mg/dL 34(H) - 37(H)  Creatinine 0.61 - 1.24 mg/dL 0.87 - 1.01  Sodium 135 - 145 mmol/L 153(H) - 153(H)  Potassium 3.5 - 5.1 mmol/L 3.0(L) 3.5 2.6(LL)  Chloride 98 - 111 mmol/L 120(H) - 116(H)  CO2 22 - 32 mmol/L 23 - 25  Calcium 8.9 - 10.3 mg/dL 8.6(L) - 8.4(L)  Total Protein 6.5 - 8.1 g/dL - - 5.5(L)  Total Bilirubin 0.3 - 1.2 mg/dL - - 26.2(HH)  Alkaline Phos 38 - 126 U/L - - 178(H)  AST 15 - 41 U/L - - 95(H)  ALT 0 - 44 U/L - - 29   Studies/Results: No results found.  Active Problems:   Encephalopathy, hepatic ([facility])   Pressure injury  of skin   Acute respiratory failure ([place])   Septic shock ([place])    Tye Savoy, NP-C @  07/23/2020, 3:54 PM

## 2020-07-23 NOTE — Progress Notes (Signed)
NAME:  [name], MRN:  [id number], DOB:  March 27, 1971, LOS: 7 ADMISSION DATE:  07/16/2020, CONSULTATION DATE:  07/23/20  REFERRING MD:  Gareth Morgan, CHIEF COMPLAINT:  Seizure   Brief History   [name] a 49 year old male with past medical history notable for EtOH use disorder, hepatic steatosis, essential hypertension, GERD, schizoaffective disorder presents after found down at home for concern for seizures, patient intubated in ED for for airway protection due to depressed consciousness.   History of present illness   Ryan McGintyis a 49 year old male with past medical history notable for EtOH use disorder, hepatic steatosis, essential hypertension, GERD, schizoaffective disorder presents after found down at home during a wellness check. History is limited given patient's level of consciousness. Per chart review patient was found at home with multiple cans of alcohol around him. Mother reported that patient states he was attempting to withdraw from alcohol a couple days ago. Sister last saw the patient 5-6 days ago at that time he was more jaundices and moving slower than at baseline.   In ED patient with decreased conciseness and seen to have jerking motions concerning for seizures. He was intubated with EEG ordered. Found to have  Elevated AST 207, ALT 68, Bili 21, Cr. 1.68. K 2.6, Mg 1.3,  albumin 1.8. Lactate of 9.6.  CBC with WBC of 14.2, hbg 11.5 improved from last admission of 8, plt 156. INR 1.6. Ammonia of 207. EKG with QT prolongation.   Past Medical History  He,  has a past medical history of Alcohol abuse, Anxiety, Depression, GERD (gastroesophageal reflux disease), Hypertension, Hepatic steatosis, Psychosis ([place]), PTSD (post-traumatic stress disorder), Schizoaffective disorder, Seizure disorder ([place]), and Symptomatic anemia.   Significant Hospital Events   11/29 Admission. MEDDRY SCORE 34 and started  solumedrol  12/2 extubated, reintubated.   12/3 -  Re-intubated overnight, and there is suspicion for aspiration  Disconnected self from vent multiple times overnight due to agitation/confusion, with subsequent desaturations. Improved when reconnected to vent.  Left on FiO2 100% and PEEP 15 overnight for post-intubation pao2 50. SpO2 98% this morning, vent settings weaned to FiO2 60% and PEEP 10, however follow up ABG with PaO2 47 prompting escalation in FiO2.  There is now green/brown liquid in ETT and HME, but scant secretions when suctioned. MELD score - 27 points   12/4 - dense left sided pneumonia. On 90% fio2 / 10 peep -> reduced to 60% fio2 -> pulse ox 93%. On precedex gtt. On levophed gtt 47mcg . Febrile yesterday. Making urine. Very jaundiced., More awake. Vomiting subsised   Consults:  Neurology  Procedures:  11/29 Intubation- 12/2.  12/2>> 12/1 PICC  Significant Diagnostic Tests:   CT head 11/29 > no acute  RUQ [place] 11/29 > small volume ascites. Hepatic cirrhosis, likely fatty infiltration os liver, features suggesting portal venous hypertension GB sludge without visible cholelithiasis. Mild GB thickening, nonspecific.   CXR 12/2> Diminished lung volumes Bilateral ASD L>R, ETT 2.4cm above carina  Micro Data:  11/29 Blood cultures > negative  11/3-0 MRSA > neg /11/30 SARS cov2 > neg 11/30 flu pcr  > neg 11/30 Tracheal Asp > few staph aureus . MSSA 11/29 UCx > 1,000 col E.Coli   Antimicrobials:  Vancomycin 11/29 > 12/1 Flagyl x 1 dose 11/29 Cefepime 11/29 >12/3 Cefazolin 12/3 (MSSA sputum and e colii uti) Xifaxan and lactulose 12/3   Interim history/subjective:  Tolerating SBT this AM  No acute events overnight  Mentation vastly improved per nurse   Objective  Blood pressure 116/67, pulse 66, temperature (!) 97.2 F (36.2 C), resp. rate (!) 23, height 6' (1.829 m), weight 98.5 kg, SpO2 97 %.    Vent Mode: PRVC FiO2 (%):  [40 %] 40 % Set Rate:  [22 bmp-23 bmp] 23 bmp Vt Set:  [450 mL-460 mL] 460 mL PEEP:  [8  cmH20-10 cmH20] 8 cmH20 Plateau Pressure:  [20 cmH20] 20 cmH20   Intake/Output Summary (Last 24 hours) at 07/23/2020 1000 Last data filed at 07/23/2020 3790 Gross per 24 hour  Intake 1546.33 ml  Output 2600 ml  Net -1053.67 ml   Filed Weights   07/21/20 0456 07/22/20 0500 07/23/20 2409  Weight: 102.9 kg 102.2 kg 98.5 kg   Physical exam: General: Acute on chronically ill appearing elderly male lying in bed on mechanical ventilation, extremely jaundice  in NAD HEENT: ETT, MM pink/moist, PERRL, sclera icteric  Neuro: Alert and interactive on vent, able to follow simple commands  CV: s1s2 regular rate and rhythm, no murmur, rubs, or gallops,  PULM:  Faint expiratory wheeze, tolerating SBT well, no increased work of breathing  GI: soft, bowel sounds active in all 4 quadrants, non-tender, non-distended, tolerating TF Extremities: warm/dry, no edema  Skin: no rashes or lesions  Resolved Hospital Problem list    Prolonged QTc 626 msec 07/17/20 - resolved on EKG 07/21/20   Assessment & Plan:    Septic shock due to Staph PNA and aspiration PNA and E colii UTI -MAP goal elevated to > 75 to facilitate urine output  P  Continue vent support as below  Culture data as above  Continue IV Cefazolin to complete 10 days of treatment, stop date in place MAP< 75, continue pressor support, wean as able   Acute respiratory failure with hypoxia requiring re-intubation -initially intubated for resp insufficiency 2/2 HE. extubated 12/2, progressively more hypoxic and dyspneic, reintubated 12/2 Staph PNA with aspiration pneumonia  - dense LUL consolidation -green/brown liquid in ETT and vent circuit 12/2-12/3 but this could have been resp secretions with his jaundice P: Continue ventilator support with lung protective strategies  Tolerating SBT well this AM possible extubate soon  Wean PEEP and FiO2 for sats greater than 90%. Head of bed elevated 30 degrees. Plateau pressures less than 30 cm H20.   Follow intermittent chest x-ray and ABG.   Ensure adequate pulmonary hygiene  Follow cultures  VAP bundle in place  PAD protocol May need to consider trach in the near future   Acute hepatic encephalopathy due to Hyperammonemia  HX of substance abuse including daily alcohol abuse  -UDS +Benzo and THC. Presented with decreased conciousness with possible seizure activity. Elevated ammonia of 204. Normal ethanol levels  -EEG without evidence of sz  P: Continue lactulose and Rifaximin Seizure precautions  Continue Precedex RASS goal 0 to -1 Continue Thiamin, folics acid, and multivitamin  CIWA scale   Alcoholic hepatitis at presentation  -elevated LFTs AST>ALT c/w alcohol use. Maddrey Score of 34. Negative for acetaminophen, salicylate, and ethanol on admission Hyperbilirubinemia and Suspected portal venous hypertension  - Findings on RUQ [place]-- small vol ascites, recanalization of umbilical vein, diminished velocities  -Lille score 0.863 on Day 6 suggesting likely non-responder to steroids P: Lilie score as above will discuss need for continued steroids with attending  Trend hepatic panel   AKI  -with findings suggestive of portal venous HTN on RUQ [place], cirrhosis concerning for possible mild HRS. S/p albumin + NE  P: Follow renal function / urine output Trend  Bmet Avoid nephrotoxins Ensure adequate renal perfusion  IV hydration Target MAP > 75  Anemia of chronic disease  Thrombocytopenia P Trend CBC Transfuse per protocol  Continue Subq heparin   HX of HTN P: Home antihypertensives remain on hold   Schizoaffective Disorder P; Home antipsychotics remain on hold   Best practice (evaluated daily)  Diet: NPO - will rechallenge TF 07/21/20 (secretions might be resp that is containing bilirubin) Pain/Anxiety/Delirium protocol (if indicated): Fent, precedex. Prn versed  VAP protocol (if indicated): Yes DVT prophylaxis: Heparin  GI prophylaxis: Pantoprazole  Glucose  control: None Mobility: Bed rest Code Status: Full Disposition: ICU Family updates: Will plan to update family daily     MDT Goals of Care Discussion   Date of Discussion 07/20/20 which is 4th day 07/22/20 - day 6  Primary service for patient CCM CCM  Location of discussion bedside Bedside but phone conf call  Family and Staff present Patient sister, mother, RN and CCM APP - [place] and Margarita Grizzle and Mom with MD - Dr Chase Caller and RN Jarrett Soho  Summary of discussion Updated and contnue current active cae Updated. Family wish for patient to live and make it home. They plan for his post recovery support. Wish for full medical care xxxx Code status - discussed.They are contemplanting   Followup goals of care due by 07/27/20 07/29/20  Misc comments if any  They will make decision on code status 07/22/20          LABS    PULMONARY Recent Labs  Lab 07/16/20 1536 07/16/20 1720 07/19/20 2304 07/20/20 0907 07/20/20 0920  PHART  --  7.542* 7.393 7.541* 7.556*  PCO2ART  --  47.5 50.3* 34.7 32.6  PO2ART  --  358* 88 30* 47*  HCO3  --  40.9* 30.7* 29.8* 28.9*  TCO2 35* 42* 32 31 30  O2SAT  --  100.0 96.0 67.0 89.0    CBC Recent Labs  Lab 07/20/20 0758 07/20/20 0907 07/20/20 0920 07/21/20 0648 07/22/20 0516  HGB 7.5*   < > 8.2* 8.1* 7.6*  HCT 22.5*   < > 24.0* 24.2* 24.4*  WBC 21.3*  --   --  19.5* 21.5*  PLT 94*  --   --  86* 95*   < > = values in this interval not displayed.    COAGULATION Recent Labs  Lab 07/16/20 1541 07/17/20 0059 07/20/20 1230 07/22/20 0516  INR 1.6* 1.5* 1.9* 1.6*    CARDIAC  No results for input(s): TROPONINI in the last 168 hours. No results for input(s): PROBNP in the last 168 hours.   CHEMISTRY Recent Labs  Lab 07/18/20 1503 07/18/20 1503 07/19/20 0600 07/19/20 0600 07/19/20 1700 07/19/20 2304 07/20/20 0500 07/20/20 0907 07/20/20 0907 07/20/20 0920 07/20/20 0920 07/20/20 1123 07/20/20 1123  07/20/20 1648 07/21/20 0648 07/21/20 0648 07/22/20 0516 07/22/20 1312 07/23/20 0412  NA 142   < > 141  --   --    < >  --  145  --  149*  --  146*  --   --  149*  --  153*  --   --   K 2.7*   < > 2.8*  --   --    < >  --  2.8*   < > 2.7*   < > 2.8*   < >  --  2.5*   < > 2.6* 3.5  --   CL 99  --  101  --   --   --   --   --   --   --   --  107  --   --  108  --  116*  --   --   CO2 27  --  24  --   --   --   --   --   --   --   --  24  --   --  26  --  25  --   --   GLUCOSE 161*  --  238*  --   --   --   --   --   --   --   --  188*  --   --  184*  --  142*  --   --   BUN 25*  --  24*  --   --   --   --   --   --   --   --  26*  --   --  34*  --  37*  --   --   CREATININE 1.69*  --  1.40*  --   --   --   --   --   --   --   --  1.21  --   --  1.22  --  1.01  --   --   CALCIUM 6.8*  --  7.7*  --   --   --   --   --   --   --   --  7.7*  --   --  8.1*  --  8.4*  --   --   MG 2.2   < > 2.3  --  1.9  --  1.9  --   --   --   --   --   --  2.7*  --   --  2.4  --   --   PHOS 1.9*   < > 2.4*   < > 2.5  --  2.9  --   --   --   --   --   --  2.5  --   --  2.9  --  1.9*   < > = values in this interval not displayed.   Estimated Creatinine Clearance: 110 mL/min (by C-G formula based on SCr of 1.01 mg/dL).   LIVER Recent Labs  Lab 07/16/20 1541 07/17/20 0059 07/17/20 1102 07/18/20 0341 07/19/20 0600 07/20/20 1123 07/20/20 1230 07/21/20 0648 07/22/20 0516  AST 207*  --    < > 130* 105* 87*  --  89* 95*  ALT 68*  --    < > 39 35 32  --  31 29  ALKPHOS 265*  --    < > 153* 177* 153*  --  176* 178*  BILITOT 21.1*  --    < > 17.0* 19.4* 21.6*  --  24.8* 26.2*  PROT 6.3*  --    < > 5.5* 5.8* 5.2*  --  5.6* 5.5*  ALBUMIN 1.8*  --    < > 2.4* 3.0* 2.7*  --  2.7* 2.5*  INR 1.6* 1.5*  --   --   --   --  1.9*  --  1.6*   < > = values in this interval not displayed.     INFECTIOUS Recent Labs  Lab 07/16/20 2100 07/16/20 2100 07/17/20 1102 07/17/20 1516 07/18/20 0341 07/18/20 1503  07/20/20 1230  LATICACIDVEN 7.7*   < >  --  2.6*  --  2.5* 1.8  PROCALCITON 0.75  --  1.30  --  1.54  --   --    < > = values in this interval not displayed.     ENDOCRINE CBG (last 3)  Recent Labs    07/22/20 2334 07/23/20 0341 07/23/20 0806  GLUCAP 163* 148* 135*       ATTESTATION & SIGNATURE  CRITICAL CARE Performed by: Johnsie Cancel  Total critical care time: 40 minutes  Critical care time was exclusive of separately billable procedures and treating other patients.  Critical care was necessary to treat or prevent imminent or life-threatening deterioration.  Critical care was time spent personally by me on the following activities: development of treatment plan with patient and/or surrogate as well as nursing, discussions with consultants, evaluation of patient's response to treatment, examination of patient, obtaining history from patient or surrogate, ordering and performing treatments and interventions, ordering and review of laboratory studies, ordering and review of radiographic studies, pulse oximetry and re-evaluation of patient's condition.  Johnsie Cancel, NP-C [facility] Contact / Pager information can be found on Amion  07/23/2020, 10:54 AM

## 2020-07-23 NOTE — Progress Notes (Signed)
Nutrition Follow-up  DOCUMENTATION CODES:   Not applicable  INTERVENTION:   Tube feeding via OG tube: Vital 1.5 @ 20 ml/hr increase by 10 ml every 8 hours to goal rate of 55 ml/h (1320 ml per day) Prosource TF 90 ml TID MVI with minerals  Provides 2220 kcal, 155 gm protein, 1003 ml free water daily  300 ml free water every 4 hours Total free water: 2803 ml   Monitor magnesium and phosphorus, MD to replete as needed, as pt is at risk for refeeding syndrome given history of severe ETOH abuse/liver disease.  Vitamin labs:  Vitamin D: 11.5 - being repleted Vitamin C: <.01 - being repleted  Vitamin labs pending:  Vitamin A  Vitamin B6  NUTRITION DIAGNOSIS:   Increased nutrient needs related to catabolic illness as evidenced by estimated needs. Ongoing.   GOAL:   Patient will meet greater than or equal to 90% of their needs Progressing.   MONITOR:   TF tolerance, Vent status, Labs  REASON FOR ASSESSMENT:   Consult, Ventilator Enteral/tube feeding initiation and management  ASSESSMENT:   Pt with PMH of alcoholic liver disease, severe alcohol abuse, hepatic steatosis, essential hypertension, GERD, PTSD, depression, and schizoaffective disorder admitted after being found down at home with Severe toxic metabolic encephalopathy due to hepatic encephalopathy, severe alcoholic hepatitis, alcoholic cirrhosis, severe hypokalemia, and AKI.   Pt discussed during ICU rounds and with RN.  Mom and sister from out of [place] at bedside.    11/30 imitated TF at 20 ml/hr due to low K+/PO4 12/2 extubated; re-intubated overnight  12/4 pt vomited - TF held 12/5 TF restarted @ 10 ml/hr  Patient is currently intubated on ventilator support MV: 15.4 L/min Temp (24hrs), Avg:98.1 F (36.7 C), Min:96.6 F (35.9 C), Max:99.3 F (37.4 C)  Medications reviewed and include: vitamin C 1000 mg daily, vitamin D 0017 units daily, folic acid, SSI, lactulose, solumedrol, MVI with minerals,  thiamine Precedex  Levophed @ 3 mcg Kphos x 1 Vasopressin @ .03 units   Labs reviewed: Na 153, K+3, PO4 1.9 Vitamin D: 11.5 Vitamin C: <.01    OG tube: tip in gastric antrum   Diet Order:   Diet Order            Diet NPO time specified  Diet effective now                 EDUCATION NEEDS:   No education needs have been identified at this time  Skin:  Skin Assessment: Skin Integrity Issues: Skin Integrity Issues:: Stage I Stage I: buttocks  Last BM:  1500 ml via rectal tube (on lactulose)  Height:   Ht Readings from Last 1 Encounters:  07/16/20 6' (1.829 m)    Weight:   Wt Readings from Last 1 Encounters:  07/23/20 98.5 kg    Ideal Body Weight:  80.9 kg  BMI:  Body mass index is 29.45 kg/m.  Estimated Nutritional Needs:   Kcal:  2200-2400  Protein:  150-175 grams  Fluid:  2 L/day  Lockie Pares., RD, LDN, CNSC See AMiON for contact information

## 2020-07-23 NOTE — Progress Notes (Signed)
SLP Cancellation Note  Patient Details Name: [name] MRN: [id number] DOB: Apr 03, 1971   Cancelled treatment:   Remains intubated. Will follow for readiness.  Leeba Barbe L. Tivis Ringer, [place] CCC/SLP Acute Rehabilitation Services Office number [phone] Pager [phone]         Juan Quam Laurice 07/23/2020, 10:12 AM

## 2020-07-23 NOTE — Progress Notes (Signed)
RT in for routine vent check. Large cuff leak heard and pt coughing around ETT. No alarms triggered by vent at this time. Upon further assessment ETT out to 18cm at the lip. Cuff deflated and ETT readvanced back to 25cm at the lip. Pt tolerated well. RT will continue to monitor.

## 2020-07-23 NOTE — Progress Notes (Signed)
RT called about pt pulling ETT out to 18cm, noticed cuff leak when entering the room. RT was able to advance tube to 25 at the lip. Pt tolerated well, RN ordering chest xray to confirm placement.

## 2020-07-24 DIAGNOSIS — K7290 Hepatic failure, unspecified without coma: Secondary | ICD-10-CM | POA: Diagnosis not present

## 2020-07-24 DIAGNOSIS — K7460 Unspecified cirrhosis of liver: Secondary | ICD-10-CM

## 2020-07-24 DIAGNOSIS — R188 Other ascites: Secondary | ICD-10-CM | POA: Diagnosis not present

## 2020-07-24 DIAGNOSIS — K7011 Alcoholic hepatitis with ascites: Secondary | ICD-10-CM

## 2020-07-24 LAB — COMPREHENSIVE METABOLIC PANEL
ALT: 31 U/L (ref 0–44)
AST: 123 U/L — ABNORMAL HIGH (ref 15–41)
Albumin: 2.1 g/dL — ABNORMAL LOW (ref 3.5–5.0)
Alkaline Phosphatase: 183 U/L — ABNORMAL HIGH (ref 38–126)
Anion gap: 11 (ref 5–15)
BUN: 40 mg/dL — ABNORMAL HIGH (ref 6–20)
CO2: 23 mmol/L (ref 22–32)
Calcium: 8.5 mg/dL — ABNORMAL LOW (ref 8.9–10.3)
Chloride: 122 mmol/L — ABNORMAL HIGH (ref 98–111)
Creatinine, Ser: 0.89 mg/dL (ref 0.61–1.24)
GFR, Estimated: >60 mL/min (ref >60)
Glucose, Bld: 147 mg/dL — ABNORMAL HIGH (ref 70–99)
Potassium: 2.9 mmol/L — ABNORMAL LOW (ref 3.5–5.1)
Sodium: 156 mmol/L — ABNORMAL HIGH (ref 135–145)
Total Bilirubin: 23.9 mg/dL (ref 0.3–1.2)
Total Protein: 5.4 g/dL — ABNORMAL LOW (ref 6.5–8.1)

## 2020-07-24 LAB — CBC
HCT: 24.3 % — ABNORMAL LOW (ref 39.0–52.0)
Hemoglobin: 7.6 g/dL — ABNORMAL LOW (ref 13.0–17.0)
MCH: 32.5 pg (ref 26.0–34.0)
MCHC: 31.3 g/dL (ref 30.0–36.0)
MCV: 103.8 fL — ABNORMAL HIGH (ref 80.0–100.0)
Platelets: 94 10*3/uL — ABNORMAL LOW (ref 150–400)
RBC: 2.34 MIL/uL — ABNORMAL LOW (ref 4.22–5.81)
RDW: 28.1 % — ABNORMAL HIGH (ref 11.5–15.5)
WBC: 22 10*3/uL — ABNORMAL HIGH (ref 4.0–10.5)
nRBC: 0.3 % — ABNORMAL HIGH (ref 0.0–0.2)

## 2020-07-24 LAB — PHOSPHORUS: Phosphorus: 4.1 mg/dL (ref 2.5–4.6)

## 2020-07-24 LAB — GLUCOSE, CAPILLARY
Glucose-Capillary: 100 mg/dL — ABNORMAL HIGH (ref 70–99)
Glucose-Capillary: 133 mg/dL — ABNORMAL HIGH (ref 70–99)
Glucose-Capillary: 146 mg/dL — ABNORMAL HIGH (ref 70–99)
Glucose-Capillary: 149 mg/dL — ABNORMAL HIGH (ref 70–99)
Glucose-Capillary: 163 mg/dL — ABNORMAL HIGH (ref 70–99)
Glucose-Capillary: 167 mg/dL — ABNORMAL HIGH (ref 70–99)

## 2020-07-24 MED ORDER — MIDODRINE HCL 5 MG PO TABS
10.0000 mg | ORAL_TABLET | Freq: Three times a day (TID) | ORAL | Status: DC
  Filled 2020-07-24: qty 2

## 2020-07-24 MED ORDER — POTASSIUM CHLORIDE 20 MEQ PO PACK
40.0000 meq | PACK | ORAL | Status: AC
  Administered 2020-07-24 (×2): 40 meq
  Filled 2020-07-24 (×2): qty 2

## 2020-07-24 MED ORDER — MIDODRINE HCL 5 MG PO TABS
10.0000 mg | ORAL_TABLET | Freq: Three times a day (TID) | ORAL | Status: DC
  Administered 2020-07-24 (×2): 10 mg
  Filled 2020-07-24: qty 2

## 2020-07-24 MED ORDER — ORAL CARE MOUTH RINSE
15.0000 mL | Freq: Two times a day (BID) | OROMUCOSAL | Status: DC
  Administered 2020-07-25 – 2020-08-06 (×17): 15 mL via OROMUCOSAL

## 2020-07-24 MED ORDER — CHLORHEXIDINE GLUCONATE 0.12 % MT SOLN
15.0000 mL | Freq: Two times a day (BID) | OROMUCOSAL | Status: DC
  Administered 2020-07-24 – 2020-08-06 (×26): 15 mL via OROMUCOSAL
  Filled 2020-07-24 (×25): qty 15

## 2020-07-24 NOTE — Procedures (Signed)
Extubation Procedure Note  Patient Details:   Name: [name] DOB: 01/06/1971 MRN: [id number]   Airway Documentation:    Vent end date: 07/24/20 Vent end time: 1722   Evaluation  O2 sats: stable throughout Complications: No apparent complications Patient did tolerate procedure well. Bilateral Breath Sounds: Diminished   Patient extubated per MD order & placed on 4L [place]. Patient able to speak & cough post extubation.  Kathie Dike 07/24/2020, 5:29 PM

## 2020-07-24 NOTE — Progress Notes (Signed)
Physical Therapy Treatment Patient Details Name: [name] MRN: [id number] DOB: 1971/06/22 Today's Date: 07/24/2020    History of Present Illness 49 y.o. male admitted with AMS with encephalopathy and alcholic hepatitis. Intubated in ED on 11/29, extubated and reintubated 12/2. PMHx: alcoholism, HTN, GERD, schizoaffective disorder    PT Comments    Pt agreeable to EOB and OOB mobility with assist of PT and OT today. Pt requiring mod +2 assist for bed mobility and transfer to stand today, increased respiratory effort noted via vent alarms during 15-second stand. Pt demonstrating mobility and cognitive progress, possible extubation later this week. PT updating d/c plan to reflect CIR, would benefit from intensive therapies s/p prolonged intubation and hospitalization.    Follow Up Recommendations  Supervision/Assistance - 24 hour;CIR     Equipment Recommendations  Other (comment) (TBD)    Recommendations for Other Services       Precautions / Restrictions Precautions Precautions: Other (comment) Precaution Comments: ETT, OG, bil wrist restraints and mitts, flexiseal Restrictions Weight Bearing Restrictions: No    Mobility  Bed Mobility Overal bed mobility: Needs Assistance Bed Mobility: Supine to Sit;Sit to Supine     Supine to sit: Mod assist;+2 for physical assistance;+2 for safety/equipment Sit to supine: Mod assist;+2 for physical assistance;+2 for safety/equipment   General bed mobility comments: pt initiates moving LEs towards EOB with assist to fully transition and for trunk elevation; assist to scoot hips   Transfers Overall transfer level: Needs assistance Equipment used: 2 person hand held assist Transfers: Sit to/from Stand Sit to Stand: Mod assist;+2 physical assistance;+2 safety/equipment         General transfer comment: pt requiring blocking at R knee and faciliation to activate quad, boosting assist and to stabilize, cues for chest upright. +2 via  face to face method utilized. once upright pt able to maintain standing with two person assist, no knee buckling noted; pt fatigues quickly   Ambulation/Gait                 Stairs             Wheelchair Mobility    Modified Rankin (Stroke Patients Only)       Balance Overall balance assessment: Needs assistance Sitting-balance support: Single extremity supported;Feet supported Sitting balance-Leahy Scale: Fair Sitting balance - Comments: typically reliant on at least single UE support    Standing balance support: Bilateral upper extremity supported Standing balance-Leahy Scale: Poor Standing balance comment: external assist                             Cognition Arousal/Alertness: Awake/alert Behavior During Therapy: Flat affect Overall Cognitive Status: Difficult to assess                                 General Comments: pt currently intubated; following majority of commands, nodding head yes/no to questions but at times with delay and at times appears to have some confusion with response to yes/no questions. pt did attempt to write a note to PT/OT end of session - asking for "coffee"       Exercises      General Comments General comments (skin integrity, edema, etc.): jaundiced skin and eyes      Pertinent Vitals/Pain Pain Assessment: Faces Faces Pain Scale: Hurts a little bit Pain Location: generalized discomfort Pain Descriptors / Indicators: Discomfort Pain Intervention(s): Limited activity  within patient's tolerance;Monitored during session;Repositioned    Home Living Family/patient expects to be discharged to:: Private residence Living Arrangements: Alone   Type of Home: Apartment Home Access: Stairs to enter   [place]: One level   Additional Comments: home setup obtained from PT eval which was obtained from previous admission/confirmed by RN    Prior Function Level of Independence: Independent           PT Goals (current goals can now be found in the care plan section) Acute Rehab PT Goals Patient Stated Goal: "coffee" PT Goal Formulation: With patient Time For Goal Achievement: 08/05/20 Potential to Achieve Goals: Fair Progress towards PT goals: Progressing toward goals    Frequency    Min 3X/week      PT Plan Discharge plan needs to be updated    Co-evaluation PT/OT/SLP Co-Evaluation/Treatment: Yes Reason for Co-Treatment: For patient/therapist safety;To address functional/ADL transfers PT goals addressed during session: Mobility/safety with mobility;Balance;Strengthening/ROM OT goals addressed during session: Strengthening/ROM      AM-PAC PT "6 Clicks" Mobility   Outcome Measure  Help needed turning from your back to your side while in a flat bed without using bedrails?: A Little Help needed moving from lying on your back to sitting on the side of a flat bed without using bedrails?: A Lot Help needed moving to and from a bed to a chair (including a wheelchair)?: A Lot Help needed standing up from a chair using your arms (e.g., wheelchair or bedside chair)?: A Lot Help needed to walk in hospital room?: Total Help needed climbing 3-5 steps with a railing? : Total 6 Click Score: 11    End of Session Equipment Utilized During Treatment: Gait belt Activity Tolerance: Patient tolerated treatment well Patient left: in bed;with call bell/phone within reach;with bed alarm set;with restraints reapplied;with SCD's reapplied;with nursing/sitter in room Nurse Communication: Mobility status PT Visit Diagnosis: Other abnormalities of gait and mobility (R26.89);Difficulty in walking, not elsewhere classified (R26.2)     Time: 7062-3762 PT Time Calculation (min) (ACUTE ONLY): 33 min  Charges:  $Therapeutic Activity: 8-22 mins                     Cephas Revard E, PT Acute Rehabilitation Services Pager [phone]  Office [phone]     Keiasha Diep D Elonda Husky 07/24/2020, 4:32 PM

## 2020-07-24 NOTE — Progress Notes (Signed)
NAME:  [name], MRN:  [id number], DOB:  24-Nov-1970, LOS: 8 ADMISSION DATE:  07/16/2020, CONSULTATION DATE:  07/24/20  REFERRING MD:  Gareth Morgan, CHIEF COMPLAINT:  Seizure   Brief History   [name] a 49 year old male with past medical history notable for EtOH use disorder, hepatic steatosis, essential hypertension, GERD, schizoaffective disorder presents after found down at home for concern for seizures, patient intubated in ED for for airway protection due to depressed consciousness.   History of present illness   Ryan McGintyis a 49 year old male with past medical history notable for EtOH use disorder, hepatic steatosis, essential hypertension, GERD, schizoaffective disorder presents after found down at home during a wellness check. History is limited given patient's level of consciousness. Per chart review patient was found at home with multiple cans of alcohol around him. Mother reported that patient states he was attempting to withdraw from alcohol a couple days ago. Sister last saw the patient 5-6 days ago at that time he was more jaundices and moving slower than at baseline.   In ED patient with decreased conciseness and seen to have jerking motions concerning for seizures. He was intubated with EEG ordered. Found to have  Elevated AST 207, ALT 68, Bili 21, Cr. 1.68. K 2.6, Mg 1.3,  albumin 1.8. Lactate of 9.6.  CBC with WBC of 14.2, hbg 11.5 improved from last admission of 8, plt 156. INR 1.6. Ammonia of 207. EKG with QT prolongation.   Past Medical History  He,  has a past medical history of Alcohol abuse, Anxiety, Depression, GERD (gastroesophageal reflux disease), Hypertension, Hepatic steatosis, Psychosis ([place]), PTSD (post-traumatic stress disorder), Schizoaffective disorder, Seizure disorder ([place]), and Symptomatic anemia.   Significant Hospital Events   11/29 Admission. MEDDRY SCORE 34 and started  solumedrol  12/2 extubated, reintubated.   12/3 -  Re-intubated overnight, and there is suspicion for aspiration  Disconnected self from vent multiple times overnight due to agitation/confusion, with subsequent desaturations. Improved when reconnected to vent.  Left on FiO2 100% and PEEP 15 overnight for post-intubation pao2 50. SpO2 98% this morning, vent settings weaned to FiO2 60% and PEEP 10, however follow up ABG with PaO2 47 prompting escalation in FiO2.  There is now green/brown liquid in ETT and HME, but scant secretions when suctioned. MELD score - 27 points   12/4 - dense left sided pneumonia. On 90% fio2 / 10 peep -> reduced to 60% fio2 -> pulse ox 93%. On precedex gtt. On levophed gtt 94mcg . Febrile yesterday. Making urine. Very jaundiced., More awake. Vomiting subsised   Consults:  Neurology  Procedures:  11/29 Intubation- 12/2.  12/2>> 12/1 PICC  Significant Diagnostic Tests:   CT head 11/29 > no acute  RUQ [place] 11/29 > small volume ascites. Hepatic cirrhosis, likely fatty infiltration os liver, features suggesting portal venous hypertension GB sludge without visible cholelithiasis. Mild GB thickening, nonspecific.   CXR 12/2> Diminished lung volumes Bilateral ASD L>R, ETT 2.4cm above carina  Micro Data:  11/29 Blood cultures > negative  11/3-0 MRSA > neg /11/30 SARS cov2 > neg 11/30 flu pcr  > neg 11/30 Tracheal Asp > few staph aureus . MSSA 11/29 UCx > 1,000 col E.Coli   Antimicrobials:  Vancomycin 11/29 > 12/1 Flagyl x 1 dose 11/29 Cefepime 11/29 >12/3 Cefazolin 12/3 (MSSA sputum and e colii uti) Xifaxan and lactulose 12/3   Interim history/subjective:  No acute events overnight Tolerating SBT again today with improved secreations  Objective   Blood pressure 105/71,  pulse (!) 59, temperature (!) 97.2 F (36.2 C), resp. rate (!) 25, height 6' (1.829 m), weight 100.5 kg, SpO2 98 %.    Vent Mode: PRVC FiO2 (%):  [40 %] 40 % Set Rate:  [23 bmp] 23 bmp Vt Set:  [460 mL] 460 mL PEEP:  [5 cmH20-8 cmH20] 5  cmH20 Pressure Support:  [8 cmH20] 8 cmH20 Plateau Pressure:  [9 cmH20-14 cmH20] 14 cmH20   Intake/Output Summary (Last 24 hours) at 07/24/2020 0747 Last data filed at 07/24/2020 0600 Gross per 24 hour  Intake 3055.85 ml  Output 650 ml  Net 2405.85 ml   Filed Weights   07/22/20 0500 07/23/20 0212 07/24/20 0500  Weight: 102.2 kg 98.5 kg 100.5 kg   Physical exam: General: Chronically ill appearing very deconditioned adult male on mechanical ventilation, in NAD, very jaundiced  HEENT: ETT, MM pink/moist, PERRL, sclera icteric  Neuro: Alert and interactive on vent, able to follow commands  CV: s1s2 regular rate and rhythm, no murmur, rubs, or gallops,  PULM:  Faint bilateral rhonchi, decreased secretions per RN, tolerating SBT well  GI: soft, bowel sounds active in all 4 quadrants, non-tender, mildly distended, tolerating TF Extremities: warm/dry, no edema  Skin: no rashes or lesions  Resolved Hospital Problem list    Prolonged QTc 626 msec 07/17/20 - resolved on EKG 07/21/20   Assessment & Plan:   Septic shock due to Staph PNA and aspiration PNA and E colii UTI -MAP goal elevated to > 75 to facilitate urine output  P  Continue respiratory support as below Follow culture data Continue IV cefazolin to complete 10 days, stop date in place Wean pressors   Acute respiratory failure with hypoxia requiring re-intubation -initially intubated for resp insufficiency 2/2 HE. extubated 12/2, progressively more hypoxic and dyspneic, reintubated 12/2 Staph PNA with aspiration pneumonia  - dense LUL consolidation -green/brown liquid in ETT and vent circuit 12/2-12/3 but this could have been resp secretions with his jaundice P: Continue ventilator support with lung protective strategies  Tolerating SBT well again today with decreased vent requirements as well as decreased secretions can likely extubate today   Wean PEEP and FiO2 for sats greater than 90%. Head of bed elevated 30  degrees. Plateau pressures less than 30 cm H20.  Follow intermittent chest x-ray and ABG.   SAT/SBT as tolerated, mentation preclude extubation  Ensure adequate pulmonary hygiene  Follow cultures  VAP bundle in place  PAD protocol  Decompensated cirrhosis with portal hypertension - Findings on RUQ [place]-- small vol ascites, recanalization of umbilical vein, diminished velocities  -Lille score 0.863 on Day 6 suggesting likely non-responder to steroids Grade 1 esophageal varices Mild portal gastropathy Recurrent alcoholic hepatitis -elevated LFTs AST>ALT c/w alcohol use. Maddrey Score of 34. Negative for acetaminophen, salicylate, and ethanol on admission P: Continue Xifaxan and BID lactulose  GI consulted 12/6, will continue to follow  Steroids taper per GI Monitor for GI bleed  Trend CBC and platlets   Acute hepatic encephalopathy due to Hyperammonemia  HX of substance abuse including daily alcohol abuse  -UDS +Benzo and THC. Presented with decreased conciousness with possible seizure activity. Elevated ammonia of 204. Normal ethanol levels  -EEG without evidence of sz  P: Continue treatment for decompensated cirrhosis as above Seizure precautions  Continue Precedex RASS goal 0 to -1 Continue Thiamin, Folic acid, and multivitamin  CIWA scale   AKI  -with findings suggestive of portal venous HTN on RUQ [place], cirrhosis concerning for possible mild HRS.  S/p albumin + NE  P: Follow renal function / urine output Trend Bmet Avoid nephrotoxins Ensure adequate renal perfusion   Anemia of chronic disease  Thrombocytopenia P H&H remains stable  Trend CC Transfuse per protocol  Continue sub heparin   HX of HTN P: Home meds on hold   Schizoaffective Disorder P; Home meds on hold   Best practice (evaluated daily)  Diet: NPO - will rechallenge TF 07/21/20 (secretions might be resp that is containing bilirubin) Pain/Anxiety/Delirium protocol (if indicated): Fent, precedex. Prn  versed  VAP protocol (if indicated): Yes DVT prophylaxis: Heparin  GI prophylaxis: Pantoprazole  Glucose control: None Mobility: Bed rest Code Status: Full Disposition: ICU Family updates: Will plan to update family daily    MDT Goals of Care Discussion   Date of Discussion 07/20/20 which is 4th day 07/22/20 - day 6  Primary service for patient CCM CCM  Location of discussion bedside Bedside but phone conf call  Family and Staff present Patient sister, mother, RN and CCM APP - [facility] and Margarita Grizzle and Mom with MD - Dr Chase Caller and RN Jarrett Soho  Summary of discussion Updated and contnue current active cae Updated. Family wish for patient to live and make it home. They plan for his post recovery support. Wish for full medical care xxxx Code status - discussed.They are contemplanting   Followup goals of care due by 07/27/20 07/29/20  Misc comments if any  They will make decision on code status 07/22/20        LABS    PULMONARY Recent Labs  Lab 07/19/20 2304 07/20/20 0907 07/20/20 0920  PHART 7.393 7.541* 7.556*  PCO2ART 50.3* 34.7 32.6  PO2ART 88 30* 47*  HCO3 30.7* 29.8* 28.9*  TCO2 32 31 30  O2SAT 96.0 67.0 89.0    CBC Recent Labs  Lab 07/21/20 0648 07/22/20 0516 07/24/20 0540  HGB 8.1* 7.6* 7.6*  HCT 24.2* 24.4* 24.3*  WBC 19.5* 21.5* 22.0*  PLT 86* 95* 94*    COAGULATION Recent Labs  Lab 07/20/20 1230 07/22/20 0516  INR 1.9* 1.6*    CARDIAC  No results for input(s): TROPONINI in the last 168 hours. No results for input(s): PROBNP in the last 168 hours.   CHEMISTRY Recent Labs  Lab 07/19/20 0600 07/19/20 0600 07/19/20 1700 07/19/20 2304 07/20/20 0500 07/20/20 0907 07/20/20 0920 07/20/20 0920 07/20/20 1123 07/20/20 1123 07/20/20 1648 07/21/20 0648 07/21/20 0648 07/22/20 0516 07/22/20 0516 07/22/20 1312 07/23/20 0412 07/23/20 1220 07/24/20 0540  NA 141  --   --    < >  --    < > 149*  --  146*  --   --  149*  --   153*  --   --   --  153*  --   K 2.8*  --   --    < >  --    < > 2.7*   < > 2.8*   < >  --  2.5*   < > 2.6*   < > 3.5  --  3.0*  --   CL 101  --   --   --   --   --   --   --  107  --   --  108  --  116*  --   --   --  120*  --   CO2 24  --   --   --   --   --   --   --  24  --   --  26  --  25  --   --   --  23  --   GLUCOSE 238*  --   --   --   --   --   --   --  188*  --   --  184*  --  142*  --   --   --  179*  --   BUN 24*  --   --   --   --   --   --   --  26*  --   --  34*  --  37*  --   --   --  34*  --   CREATININE 1.40*  --   --   --   --   --   --   --  1.21  --   --  1.22  --  1.01  --   --   --  0.87  --   CALCIUM 7.7*  --   --   --   --   --   --   --  7.7*  --   --  8.1*  --  8.4*  --   --   --  8.6*  --   MG 2.3  --  1.9  --  1.9  --   --   --   --   --  2.7*  --   --  2.4  --   --   --   --   --   PHOS 2.4*   < > 2.5  --  2.9  --   --   --   --   --  2.5  --   --  2.9  --   --  1.9*  --  4.1   < > = values in this interval not displayed.   Estimated Creatinine Clearance: 128.9 mL/min (by C-G formula based on SCr of 0.87 mg/dL).   LIVER Recent Labs  Lab 07/18/20 0341 07/19/20 0600 07/20/20 1123 07/20/20 1230 07/21/20 0648 07/22/20 0516  AST 130* 105* 87*  --  89* 95*  ALT 39 35 32  --  31 29  ALKPHOS 153* 177* 153*  --  176* 178*  BILITOT 17.0* 19.4* 21.6*  --  24.8* 26.2*  PROT 5.5* 5.8* 5.2*  --  5.6* 5.5*  ALBUMIN 2.4* 3.0* 2.7*  --  2.7* 2.5*  INR  --   --   --  1.9*  --  1.6*     INFECTIOUS Recent Labs  Lab 07/17/20 1102 07/17/20 1516 07/18/20 0341 07/18/20 1503 07/20/20 1230  LATICACIDVEN  --  2.6*  --  2.5* 1.8  PROCALCITON 1.30  --  1.54  --   --      ENDOCRINE CBG (last 3)  Recent Labs    07/23/20 1937 07/23/20 2325 07/24/20 0331  GLUCAP 137* 149* 167*       ATTESTATION & SIGNATURE  CRITICAL CARE Performed by: Johnsie Cancel  Total critical care time: 38 minutes  Critical care time was exclusive of separately billable  procedures and treating other patients.  Critical care was necessary to treat or prevent imminent or life-threatening deterioration.  Critical care was time spent personally by me on the following activities: development of treatment plan with patient and/or surrogate as well as nursing, discussions with consultants, evaluation of patient's response to treatment, examination of patient, obtaining history from patient or  surrogate, ordering and performing treatments and interventions, ordering and review of laboratory studies, ordering and review of radiographic studies, pulse oximetry and re-evaluation of patient's condition.  Johnsie Cancel, NP-C [facility] Contact / Pager information can be found on Amion  07/24/2020, 7:47 AM

## 2020-07-24 NOTE — Evaluation (Signed)
Occupational Therapy Evaluation Patient Details Name: [name] MRN: [id number] DOB: October 26, 1970 Today's Date: 07/24/2020    History of Present Illness 49 y.o. male admitted with AMS with encephalopathy and alcholic hepatitis. Intubated in ED on 11/29, extubated and reintubated 12/2. PMHx: alcoholism, HTN, GERD, schizoaffective disorder   Clinical Impression   This 49 y/o male presents with the above. PTA pt reports being independent with ADL and functional mobility (via head nods), living alone. Pt currently presenting with deficits including decreased sitting/standing balance, overall weakness and decreased activity tolerance. Pt tolerating sitting EOB and sit<>Stand today, requiring two person assist for all mobility tasks. He requires up to [place] for ADL at this time. Pt fatigues quickly but anticipate he will progress well with continued therapies. VSS throughout (pt on vent, PEEP 5, 40% FiO2). He will benefit from continued acute OT services and currently recommend follow up therapies at CIR level to maximize his safety and independence with ADL and mobility.     Follow Up Recommendations  CIR (pending ability to wean)    Equipment Recommendations  3 in 1 bedside commode;Other (comment) (TBD)    Recommendations for Other Services Rehab consult     Precautions / Restrictions Precautions Precautions: Other (comment) Precaution Comments: ETT, OG, bil wrist restraints, flexiseal Restrictions Weight Bearing Restrictions: No      Mobility Bed Mobility Overal bed mobility: Needs Assistance Bed Mobility: Supine to Sit;Sit to Supine     Supine to sit: Mod assist;+2 for physical assistance;+2 for safety/equipment Sit to supine: Mod assist;+2 for physical assistance;+2 for safety/equipment   General bed mobility comments: pt initiates moving LEs towards EOB with assist to fully transition and for trunk elevation; assist to scoot hips     Transfers Overall transfer level:  Needs assistance Equipment used: 2 person hand held assist Transfers: Sit to/from Stand Sit to Stand: Mod assist;+2 physical assistance;+2 safety/equipment         General transfer comment: pt requiring blocking at R knee and faciliation to activate quad, boosting assist and to stabilize, cues for chest upright. +2 via face to face method utilized. once upright pt able to maintain standing with two person assist, no knee buckling noted; pt fatigues quickly     Balance Overall balance assessment: Needs assistance Sitting-balance support: Single extremity supported;Feet supported Sitting balance-Leahy Scale: Fair Sitting balance - Comments: typically reliant on at least single UE support    Standing balance support: Bilateral upper extremity supported Standing balance-Leahy Scale: Poor Standing balance comment: external assist                            ADL either performed or assessed with clinical judgement   ADL Overall ADL's : Needs assistance/impaired Eating/Feeding: NPO                                     General ADL Comments: pt overall totalA for ADL at this time     Vision         Perception     Praxis      Pertinent Vitals/Pain Pain Assessment: Faces Faces Pain Scale: Hurts a little bit Pain Location: generalized discomfort Pain Descriptors / Indicators: Discomfort Pain Intervention(s): Monitored during session;Repositioned     Hand Dominance Right   Extremity/Trunk Assessment Upper Extremity Assessment Upper Extremity Assessment: Generalized weakness   Lower Extremity Assessment Lower Extremity Assessment: Defer  to PT evaluation   Cervical / Trunk Assessment Cervical / Trunk Assessment: Other exceptions Cervical / Trunk Exceptions: forward head, rounded shoulders   Communication Communication Communication: Other (comment) (ETT)   Cognition Arousal/Alertness: Awake/alert Behavior During Therapy: Flat affect Overall  Cognitive Status: Difficult to assess                                 General Comments: pt currently intubated; following majority of commands, nodding head yes/no to questions but at times with delay and at times appears to have some confusion with response to yes/no questions. pt did attempt to write a note to PT/OT end of session - asking for "coffee"    General Comments  pt jaundice (notably) in face and eyes     Exercises     Shoulder Instructions      Home Living Family/patient expects to be discharged to:: Private residence Living Arrangements: Alone   Type of Home: Apartment Home Access: Stairs to enter Entrance Stairs-Number of Steps: flight   Home Layout: One level     Bathroom Shower/Tub: Occupational psychologist: Standard         Additional Comments: home setup obtained from PT eval which was obtained from previous admission/confirmed by RN      Prior Functioning/Environment Level of Independence: Independent                 OT Problem List: Decreased strength;Decreased range of motion;Decreased activity tolerance;Impaired balance (sitting and/or standing);Decreased cognition;Decreased safety awareness;Cardiopulmonary status limiting activity;Decreased knowledge of use of DME or AE      OT Treatment/Interventions: Self-care/ADL training;Therapeutic exercise;Energy conservation;Therapeutic activities;Cognitive remediation/compensation;Balance training;Patient/family education;DME and/or AE instruction    OT Goals(Current goals can be found in the care plan section) Acute Rehab OT Goals Patient Stated Goal: "coffee" OT Goal Formulation: With patient Time For Goal Achievement: 08/07/20 Potential to Achieve Goals: Good  OT Frequency: Min 2X/week   Barriers to D/C:            Co-evaluation PT/OT/SLP Co-Evaluation/Treatment: Yes Reason for Co-Treatment: Complexity of the patient's impairments (multi-system involvement);For  patient/therapist safety;To address functional/ADL transfers   OT goals addressed during session: Strengthening/ROM      AM-PAC OT "6 Clicks" Daily Activity     Outcome Measure Help from another person eating meals?: Total Help from another person taking care of personal grooming?: Total Help from another person toileting, which includes using toliet, bedpan, or urinal?: Total Help from another person bathing (including washing, rinsing, drying)?: Total Help from another person to put on and taking off regular upper body clothing?: Total Help from another person to put on and taking off regular lower body clothing?: Total 6 Click Score: 6   End of Session Equipment Utilized During Treatment: Gait belt;Oxygen Nurse Communication: Mobility status  Activity Tolerance: Patient tolerated treatment well Patient left: in bed;with bed alarm set;with call bell/phone within reach;with restraints reapplied;with nursing/sitter in room  OT Visit Diagnosis: Unsteadiness on feet (R26.81);Muscle weakness (generalized) (M62.81);Other symptoms and signs involving cognitive function                Time: 2035-5974 OT Time Calculation (min): 35 min Charges:  OT General Charges $OT Visit: 1 Visit OT Evaluation $OT Eval High Complexity: 1 High  Lou Cal, OT Acute Rehabilitation Services Pager [phone] Office [phone]   Raymondo Band 07/24/2020, 3:29 PM

## 2020-07-24 NOTE — Progress Notes (Addendum)
Progress Note  Chief Complaint:   Decompensated cirrhosis      ASSESSMENT / PLAN:    # 49 yo male with decompensated cirrhosis with portal HTN and recurrent Etoh hepatitis. He is still intubated but more alert than yesterday. Gettng lactulose for HE. On IV steroids for MDF > 32.  Bilirubin stable, slightly better today ~24.  --More alert today. Hopefully he will be extubated soon. Continue BID Lactulose.  --Only small volume ascites on [place] but on Ancef which should cover for SBP --Platelets are < 100, he is getting SQ heparin. Monitor for GI bleeding  # Septic shock due to staph PNA / aspiration, E.coli UTI.  On vent, hopefully SBT soon.   # Prolonged QTc  # Hypernatremia, worsening. NA+156. Getting free water flushes  # Chronic [place] anemia, likely secondary to cirrhosis, maybe bone marrow suppression from Etoh. Hgb stable at 7.6.    Attending Physician Note   I have taken an interval history, reviewed the chart and examined the patient. I agree with the Advanced Practitioner's note, impression and recommendations.   Lucio Edward, MD [facility]       SUBJECTIVE:   No abdominal pain, feels "so-so"   OBJECTIVE:    Scheduled inpatient medications:  . vitamin C  1,000 mg Per Tube Daily  . chlorhexidine gluconate (MEDLINE KIT)  15 mL Mouth Rinse BID  . Chlorhexidine Gluconate Cloth  6 each Topical Daily  . cholecalciferol  2,000 Units Per Tube Daily  . feeding supplement (PROSource TF)  90 mL Per Tube TID  . folic acid  1 mg Per Tube Daily  . free water  300 mL Per Tube Q4H  . heparin injection (subcutaneous)  5,000 Units Subcutaneous Q8H  . insulin aspart  0-15 Units Subcutaneous Q4H  . ipratropium-albuterol  3 mL Nebulization TID  . lactulose  30 g Per Tube BID  . mouth rinse  15 mL Mouth Rinse 10 times per day  . methylPREDNISolone (SOLU-MEDROL) injection  32 mg Intravenous Daily  . midodrine  10 mg Per Tube TID WC  . multivitamin with  minerals  1 tablet Per Tube Daily  . pantoprazole sodium  40 mg Per Tube Daily  . potassium chloride  40 mEq Per Tube Q4H  . rifaximin  550 mg Per Tube BID  . sodium chloride flush  10-40 mL Intracatheter Q12H  . sodium chloride flush  3 mL Intravenous Q12H  . thiamine  100 mg Per Tube Daily   Continuous inpatient infusions:  . sodium chloride Stopped (07/17/20 1958)  .  ceFAZolin (ANCEF) IV Stopped (07/24/20 0631)  . dexmedetomidine (PRECEDEX) IV infusion 0.7 mcg/kg/hr (07/24/20 1100)  . feeding supplement (VITAL 1.5 CAL) 35 mL/hr at 07/24/20 0820  . norepinephrine (LEVOPHED) Adult infusion 1 mcg/min (07/24/20 1100)  . vasopressin 0.03 Units/min (07/24/20 1100)   PRN inpatient medications: sodium chloride, docusate, fentaNYL (SUBLIMAZE) injection, midazolam, polyethylene glycol, sodium chloride flush  Vital signs in last 24 hours: Temp:  [97 F (36.1 C)-99 F (37.2 C)] 97.9 F (36.6 C) (12/07 1115) Pulse Rate:  [57-80] 66 (12/07 1115) Resp:  [14-32] 24 (12/07 1115) BP: (86-131)/(47-79) 103/67 (12/07 1115) SpO2:  [92 %-100 %] 99 % (12/07 1115) FiO2 (%):  [40 %] 40 % (12/07 1110) Weight:  [100.5 kg] 100.5 kg (12/07 0500) Last BM Date: 07/22/20  Intake/Output Summary (Last 24 hours) at 07/24/2020 1227 Last data filed at 07/24/2020 1100 Gross per 24 hour  Intake 2173.64 ml  Output 475 ml  Net 1698.64 ml     Physical Exam:  General: Alert male in NAD. Mother at bedside Heart:  Regular rate and rhythm Pulmonary: Still intubated.  Abdomen: Soft, nondistended, nontender. Normal bowel sounds.  Neurologic: Alert    Filed Weights   07/22/20 0500 07/23/20 0212 07/24/20 0500  Weight: 102.2 kg 98.5 kg 100.5 kg    Intake/Output from previous day: 12/06 0701 - 12/07 0700 In: 3055.9 [I.V.:863.7; NG/GT:1973.8; IV Piggyback:218.5] Out: 650 [Urine:650] Intake/Output this shift: Total I/O In: 415.1 [I.V.:165.8; NG/GT:151.7; IV Piggyback:97.7] Out: -     Lab Results: Recent  Labs    07/22/20 0516 07/24/20 0540  WBC 21.5* 22.0*  HGB 7.6* 7.6*  HCT 24.4* 24.3*  PLT 95* 94*   BMET Recent Labs    07/22/20 0516 07/22/20 0516 07/22/20 1312 07/23/20 1220 07/24/20 0808  NA 153*  --   --  153* 156*  K 2.6*   < > 3.5 3.0* 2.9*  CL 116*  --   --  120* 122*  CO2 25  --   --  23 23  GLUCOSE 142*  --   --  179* 147*  BUN 37*  --   --  34* 40*  CREATININE 1.01  --   --  0.87 0.89  CALCIUM 8.4*  --   --  8.6* 8.5*   < > = values in this interval not displayed.   LFT Recent Labs    07/24/20 0808  PROT 5.4*  ALBUMIN 2.1*  AST 123*  ALT 31  ALKPHOS 183*  BILITOT 23.9*   PT/INR Recent Labs    07/22/20 0516  LABPROT 18.2*  INR 1.6*   Hepatitis Panel No results for input(s): HEPBSAG, HCVAB, HEPAIGM, HEPBIGM in the last 72 hours.  DG CHEST PORT 1 VIEW  Result Date: 07/23/2020 CLINICAL DATA:  Repositioning of endotracheal tube. EXAM: PORTABLE CHEST 1 VIEW COMPARISON:  Chest x-ray 07/21/2020 FINDINGS: Endotracheal tube terminates approximately 5.5 cm above the carina. Enteric tube courses below the hemidiaphragm. Right PICC with tip overlying the expected region of the superior vena cava. The heart size and mediastinal contours are within normal limits. Interval improvement of diffuse patchy airspace opacities. Interval marked improved aeration of the left lung. Slightly increased interstitial markings. No pleural effusion. No pneumothorax. No acute osseous abnormality. IMPRESSION: 1. Marked improved aeration of the left lung as well as improved but persistent bilateral patchy airspace opacities. 2. Endotracheal tube with tip terminating 5.5 cm above the carina. 3. Enteric tube courses below diaphragm. 4. Right PICC with tip overlying the expected region of the distal superior vena cava. Electronically Signed   By: Iven Finn M.D.   On: 07/23/2020 23:24      Active Problems:   Encephalopathy, hepatic (HCC)   Pressure injury of skin   Acute  respiratory failure ([place])   Septic shock ([place])     LOS: 8 days   Tye Savoy ,NP 07/24/2020, 12:27 PM

## 2020-07-25 DIAGNOSIS — K7011 Alcoholic hepatitis with ascites: Secondary | ICD-10-CM

## 2020-07-25 DIAGNOSIS — K7290 Hepatic failure, unspecified without coma: Secondary | ICD-10-CM | POA: Diagnosis not present

## 2020-07-25 DIAGNOSIS — K7031 Alcoholic cirrhosis of liver with ascites: Secondary | ICD-10-CM

## 2020-07-25 LAB — COMPREHENSIVE METABOLIC PANEL
ALT: 37 U/L (ref 0–44)
AST: 164 U/L — ABNORMAL HIGH (ref 15–41)
Albumin: 2.3 g/dL — ABNORMAL LOW (ref 3.5–5.0)
Alkaline Phosphatase: 218 U/L — ABNORMAL HIGH (ref 38–126)
Anion gap: 13 (ref 5–15)
BUN: 45 mg/dL — ABNORMAL HIGH (ref 6–20)
CO2: 21 mmol/L — ABNORMAL LOW (ref 22–32)
Calcium: 8.8 mg/dL — ABNORMAL LOW (ref 8.9–10.3)
Chloride: 123 mmol/L — ABNORMAL HIGH (ref 98–111)
Creatinine, Ser: 0.94 mg/dL (ref 0.61–1.24)
GFR, Estimated: >60 mL/min (ref >60)
Glucose, Bld: 115 mg/dL — ABNORMAL HIGH (ref 70–99)
Potassium: 3.4 mmol/L — ABNORMAL LOW (ref 3.5–5.1)
Sodium: 157 mmol/L — ABNORMAL HIGH (ref 135–145)
Total Bilirubin: 24.6 mg/dL (ref 0.3–1.2)
Total Protein: 5.7 g/dL — ABNORMAL LOW (ref 6.5–8.1)

## 2020-07-25 LAB — CBC
HCT: 26.2 % — ABNORMAL LOW (ref 39.0–52.0)
Hemoglobin: 8.2 g/dL — ABNORMAL LOW (ref 13.0–17.0)
MCH: 32.8 pg (ref 26.0–34.0)
MCHC: 31.3 g/dL (ref 30.0–36.0)
MCV: 104.8 fL — ABNORMAL HIGH (ref 80.0–100.0)
Platelets: 134 10*3/uL — ABNORMAL LOW (ref 150–400)
RBC: 2.5 MIL/uL — ABNORMAL LOW (ref 4.22–5.81)
RDW: 28.8 % — ABNORMAL HIGH (ref 11.5–15.5)
WBC: 28.5 10*3/uL — ABNORMAL HIGH (ref 4.0–10.5)
nRBC: 0.2 % (ref 0.0–0.2)

## 2020-07-25 LAB — GLUCOSE, CAPILLARY
Glucose-Capillary: 106 mg/dL — ABNORMAL HIGH (ref 70–99)
Glucose-Capillary: 120 mg/dL — ABNORMAL HIGH (ref 70–99)
Glucose-Capillary: 131 mg/dL — ABNORMAL HIGH (ref 70–99)
Glucose-Capillary: 134 mg/dL — ABNORMAL HIGH (ref 70–99)
Glucose-Capillary: 152 mg/dL — ABNORMAL HIGH (ref 70–99)
Glucose-Capillary: 99 mg/dL (ref 70–99)

## 2020-07-25 LAB — PHOSPHORUS: Phosphorus: 4 mg/dL (ref 2.5–4.6)

## 2020-07-25 MED ORDER — RIFAXIMIN 550 MG PO TABS
550.0000 mg | ORAL_TABLET | Freq: Two times a day (BID) | ORAL | Status: DC
  Administered 2020-07-25 – 2020-08-06 (×25): 550 mg via ORAL
  Filled 2020-07-25 (×25): qty 1

## 2020-07-25 MED ORDER — RESOURCE THICKENUP CLEAR PO POWD
ORAL | Status: DC | PRN
  Filled 2020-07-25 (×2): qty 125

## 2020-07-25 MED ORDER — MIDODRINE HCL 5 MG PO TABS
10.0000 mg | ORAL_TABLET | Freq: Three times a day (TID) | ORAL | Status: DC
  Administered 2020-07-25 – 2020-07-28 (×11): 10 mg via ORAL
  Filled 2020-07-25 (×12): qty 2

## 2020-07-25 MED ORDER — POTASSIUM CHLORIDE 20 MEQ PO PACK
40.0000 meq | PACK | Freq: Once | ORAL | Status: AC
  Administered 2020-07-25: 40 meq via ORAL
  Filled 2020-07-25: qty 2

## 2020-07-25 MED ORDER — ADULT MULTIVITAMIN W/MINERALS CH
1.0000 | ORAL_TABLET | Freq: Every day | ORAL | Status: DC
  Administered 2020-07-25 – 2020-08-06 (×13): 1 via ORAL
  Filled 2020-07-25 (×13): qty 1

## 2020-07-25 MED ORDER — LACTULOSE 10 GM/15ML PO SOLN
30.0000 g | Freq: Two times a day (BID) | ORAL | Status: DC
  Administered 2020-07-25: 30 g via ORAL
  Filled 2020-07-25: qty 45

## 2020-07-25 MED ORDER — POTASSIUM CHLORIDE 10 MEQ/100ML IV SOLN
10.0000 meq | INTRAVENOUS | Status: DC
  Administered 2020-07-25: 10 meq via INTRAVENOUS
  Filled 2020-07-25 (×5): qty 100

## 2020-07-25 MED ORDER — LACTULOSE 10 GM/15ML PO SOLN
20.0000 g | Freq: Two times a day (BID) | ORAL | Status: DC
  Administered 2020-07-25: 20 g via ORAL
  Filled 2020-07-25: qty 30

## 2020-07-25 MED ORDER — ASCORBIC ACID 500 MG PO TABS
1000.0000 mg | ORAL_TABLET | Freq: Every day | ORAL | Status: DC
  Administered 2020-07-25 – 2020-08-01 (×8): 1000 mg via ORAL
  Filled 2020-07-25 (×9): qty 2

## 2020-07-25 MED ORDER — VITAMIN D 25 MCG (1000 UNIT) PO TABS
2000.0000 [IU] | ORAL_TABLET | Freq: Every day | ORAL | Status: DC
  Administered 2020-07-25 – 2020-08-06 (×13): 2000 [IU] via ORAL
  Filled 2020-07-25 (×13): qty 2

## 2020-07-25 MED ORDER — ENSURE ENLIVE PO LIQD
237.0000 mL | Freq: Three times a day (TID) | ORAL | Status: DC
  Administered 2020-07-25 – 2020-08-04 (×18): 237 mL via ORAL
  Filled 2020-07-25 (×2): qty 237

## 2020-07-25 MED ORDER — FOLIC ACID 1 MG PO TABS
1.0000 mg | ORAL_TABLET | Freq: Every day | ORAL | Status: DC
  Administered 2020-07-25 – 2020-08-06 (×13): 1 mg via ORAL
  Filled 2020-07-25 (×13): qty 1

## 2020-07-25 MED ORDER — THIAMINE HCL 100 MG PO TABS
100.0000 mg | ORAL_TABLET | Freq: Every day | ORAL | Status: DC
  Administered 2020-07-25 – 2020-08-06 (×13): 100 mg via ORAL
  Filled 2020-07-25 (×13): qty 1

## 2020-07-25 MED ORDER — DEXTROSE 5 % IV SOLN: INTRAVENOUS | Status: AC

## 2020-07-25 MED ORDER — PANTOPRAZOLE SODIUM 40 MG PO TBEC
40.0000 mg | DELAYED_RELEASE_TABLET | Freq: Two times a day (BID) | ORAL | Status: DC
  Administered 2020-07-25 – 2020-08-06 (×24): 40 mg via ORAL
  Filled 2020-07-25 (×24): qty 1

## 2020-07-25 MED ORDER — IBUPROFEN 100 MG PO CHEW
200.0000 mg | CHEWABLE_TABLET | Freq: Three times a day (TID) | ORAL | Status: DC | PRN
  Administered 2020-07-25 – 2020-07-31 (×4): 200 mg via ORAL
  Filled 2020-07-25 (×6): qty 2

## 2020-07-25 NOTE — Progress Notes (Signed)
NAME:  [name], MRN:  [id number], DOB:  May 16, 1971, LOS: 9 ADMISSION DATE:  07/16/2020, CONSULTATION DATE:  07/25/20  REFERRING MD:  Gareth Morgan, CHIEF COMPLAINT:  Seizure   Brief History   [name] a 49 year old male with past medical history notable for EtOH use disorder, hepatic steatosis, essential hypertension, GERD, schizoaffective disorder presents after found down at home for concern for seizures, patient intubated in ED for for airway protection due to depressed consciousness.   History of present illness   Scorpio McGintyis a 49 year old male with past medical history notable for EtOH use disorder, hepatic steatosis, essential hypertension, GERD, schizoaffective disorder presents after found down at home during a wellness check. History is limited given patient's level of consciousness. Per chart review patient was found at home with multiple cans of alcohol around him. Mother reported that patient states he was attempting to withdraw from alcohol a couple days ago. Sister last saw the patient 5-6 days ago at that time he was more jaundices and moving slower than at baseline.   In ED patient with decreased conciseness and seen to have jerking motions concerning for seizures. He was intubated with EEG ordered. Found to have  Elevated AST 207, ALT 68, Bili 21, Cr. 1.68. K 2.6, Mg 1.3,  albumin 1.8. Lactate of 9.6.  CBC with WBC of 14.2, hbg 11.5 improved from last admission of 8, plt 156. INR 1.6. Ammonia of 207. EKG with QT prolongation.   Past Medical History  He,  has a past medical history of Alcohol abuse, Anxiety, Depression, GERD (gastroesophageal reflux disease), Hypertension, Hepatic steatosis, Psychosis ([place]), PTSD (post-traumatic stress disorder), Schizoaffective disorder, Seizure disorder ([place]), and Symptomatic anemia.   Significant Hospital Events   11/29 Admission. MEDDRY SCORE 34 and started  solumedrol  12/2 extubated, reintubated.   12/3 -  Re-intubated overnight, and there is suspicion for aspiration  Disconnected self from vent multiple times overnight due to agitation/confusion, with subsequent desaturations. Improved when reconnected to vent.  Left on FiO2 100% and PEEP 15 overnight for post-intubation pao2 50. SpO2 98% this morning, vent settings weaned to FiO2 60% and PEEP 10, however follow up ABG with PaO2 47 prompting escalation in FiO2.  There is now green/brown liquid in ETT and HME, but scant secretions when suctioned. MELD score - 27 points   12/4 - dense left sided pneumonia. On 90% fio2 / 10 peep -> reduced to 60% fio2 -> pulse ox 93%. On precedex gtt. On levophed gtt 66mcg . Febrile yesterday. Making urine. Very jaundiced., More awake. Vomiting subsised   Consults:  Neurology  Procedures:  11/29 Intubation- 12/2.  12/2>> 12/7 12/1 PICC  Significant Diagnostic Tests:   CT head 11/29 > no acute  RUQ [place] 11/29 > small volume ascites. Hepatic cirrhosis, likely fatty infiltration os liver, features suggesting portal venous hypertension GB sludge without visible cholelithiasis. Mild GB thickening, nonspecific.   CXR 12/2> Diminished lung volumes Bilateral ASD L>R, ETT 2.4cm above carina  Micro Data:  11/29 Blood cultures > negative  11/3-0 MRSA > neg /11/30 SARS cov2 > neg 11/30 flu pcr  > neg 11/30 Tracheal Asp > few staph aureus . MSSA 11/29 UCx > 1,000 col E.Coli   Antimicrobials:  Vancomycin 11/29 > 12/1 Flagyl x 1 dose 11/29 Cefepime 11/29 >12/3 Cefazolin 12/3 (MSSA sputum and e colii uti) Xifaxan and lactulose 12/3   Interim history/subjective:  Extubated evening of 12/7 No acute events overnight  Requesting something to drink but failed bedside swallow  eval   Objective   Blood pressure 118/74, pulse 91, temperature 100.1 F (37.8 C), temperature source Axillary, resp. rate 20, height 6' (1.829 m), weight 100.5 kg, SpO2 96 %.    Vent Mode: PSV;CPAP FiO2 (%):  [40 %] 40 % PEEP:  [5 cmH20] 5  cmH20 Pressure Support:  [5 cmH20-8 cmH20] 8 cmH20   Intake/Output Summary (Last 24 hours) at 07/25/2020 0729 Last data filed at 07/25/2020 0600 Gross per 24 hour  Intake 1309.24 ml  Output 2300 ml  Net -990.76 ml   Filed Weights   07/23/20 0212 07/24/20 0500 07/25/20 0500  Weight: 98.5 kg 100.5 kg 100.5 kg   Physical exam: General: Chronically ill appearing elderly jaundiced adult male lying in bed in NAD HEENT: [place]/AT, MM pink/moist, PERRL,  Neuro: Alert and oriented x3, non-focal  CV: s1s2 regular rate and rhythm, no murmur, rubs, or gallops,  PULM:  Clear to ascultation, handle secretions well, no added breath sounds GI: soft, bowel sounds active in all 4 quadrants, non-tender, non-distended Extremities: warm/dry, no edema  Skin: no rashes or lesions  Resolved Hospital Problem list   Prolonged QTc 626 msec 07/17/20 - resolved on EKG 07/21/20 Septic shock  -Pressors stopped 12/8 Acute respiratory failure with hypoxia requiring re-intubation -initially intubated for resp insufficiency 2/2 extubated 12/2, progressively more hypoxic and dyspneic, reintubated 12/2 extubated again 12/7 AKI   Assessment & Plan:   Staph PNA and aspiration PNA and E colii UTI -MAP goal elevated to > 75 to facilitate urine output  P  Encourage frequent pulmonary hygiene  Aspiration precautions  Follow cultures IV Cefazolin x 10 days, stop date 12/8  Decompensated cirrhosis with portal hypertension - Findings on RUQ [place]-- small vol ascites, recanalization of umbilical vein, diminished velocities  -Lille score 0.863 on Day 6 suggesting likely non-responder to steroids Grade 1 esophageal varices Mild portal gastropathy Recurrent alcoholic hepatitis -elevated LFTs AST>ALT c/w alcohol use. Maddrey Score of 34. Negative for acetaminophen, salicylate, and ethanol on admission P: Continue Xifaxan and BID lactulose  GI follow appreciate assistance  Steroid taper per GI Monitor for GI bleed  Trend  CBC and plt   Acute hepatic encephalopathy due to Hyperammonemia -Resolved  HX of substance abuse including daily alcohol abuse  -UDS +Benzo and THC. Presented with decreased conciousness with possible seizure activity. Elevated ammonia of 204. Normal ethanol levels  -EEG without evidence of sz  P: Continue treatment as above  Seizure precautions  Continue Thimaine. Folic acid, and Multivitamin  CIWA scale   Anemia of chronic disease  Thrombocytopenia P H&H stable Trend CBC  Transfuse per protocol   HX of HTN P: Home meds remain on hold   Schizoaffective Disorder P; Home meds on hold   Hypernatremia  Hypokalemia  P: Start D5 as unable to do free water  7L deficit  Supplement Potassium again today   Best practice (evaluated daily)  Diet: NPO - will rechallenge TF 07/21/20 (secretions might be resp that is containing bilirubin) Pain/Anxiety/Delirium protocol (if indicated): Fent, precedex. Prn versed  VAP protocol (if indicated): Yes DVT prophylaxis: Heparin  GI prophylaxis: Pantoprazole  Glucose control: None Mobility: Bed rest Code Status: Full Disposition: ICU Family updates: Mother and sister updated daily     MDT Goals of Care Discussion   Date of Discussion 07/20/20 which is 4th day 07/22/20 - day 6  Primary service for patient CCM CCM  Location of discussion bedside Bedside but phone conf call  Family and Staff present Patient  sister, mother, RN and CCM APP - [place] and Margarita Grizzle and Mom with MD - Dr Chase Caller and RN Jarrett Soho  Summary of discussion Updated and contnue current active cae Updated. Family wish for patient to live and make it home. They plan for his post recovery support. Wish for full medical care xxxx Code status - discussed.They are contemplanting   Followup goals of care due by 07/27/20 07/29/20  Misc comments if any  They will make decision on code status 07/22/20        LABS    PULMONARY Recent Labs  Lab  07/19/20 2304 07/20/20 0907 07/20/20 0920  PHART 7.393 7.541* 7.556*  PCO2ART 50.3* 34.7 32.6  PO2ART 88 30* 47*  HCO3 30.7* 29.8* 28.9*  TCO2 32 31 30  O2SAT 96.0 67.0 89.0    CBC Recent Labs  Lab 07/22/20 0516 07/24/20 0540 07/25/20 0521  HGB 7.6* 7.6* 8.2*  HCT 24.4* 24.3* 26.2*  WBC 21.5* 22.0* 28.5*  PLT 95* 94* 134*    COAGULATION Recent Labs  Lab 07/20/20 1230 07/22/20 0516  INR 1.9* 1.6*    CARDIAC  No results for input(s): TROPONINI in the last 168 hours. No results for input(s): PROBNP in the last 168 hours.   CHEMISTRY Recent Labs  Lab 07/19/20 0600 07/19/20 0600 07/19/20 1700 07/19/20 2304 07/20/20 0500 07/20/20 0907 07/20/20 1123 07/20/20 1648 07/21/20 0648 07/21/20 0648 07/22/20 0516 07/22/20 1312 07/23/20 0412 07/23/20 1220 07/23/20 1220 07/24/20 0540 07/24/20 0808 07/25/20 0521  NA 141  --   --    < >  --    < >   < >  --  149*  --  153*  --   --  153*  --   --  156* 157*  K 2.8*  --   --    < >  --    < >   < >  --  2.5*   < > 2.6*   < >  --  3.0*   < >  --  2.9* 3.4*  CL 101  --   --   --   --    < >   < >  --  108  --  116*  --   --  120*  --   --  122* 123*  CO2 24  --   --   --   --    < >   < >  --  26  --  25  --   --  23  --   --  23 21*  GLUCOSE 238*  --   --   --   --    < >   < >  --  184*  --  142*  --   --  179*  --   --  147* 115*  BUN 24*  --   --   --   --    < >   < >  --  34*  --  37*  --   --  34*  --   --  40* 45*  CREATININE 1.40*  --   --   --   --    < >   < >  --  1.22  --  1.01  --   --  0.87  --   --  0.89 0.94  CALCIUM 7.7*  --   --   --   --    < >   < >  --  8.1*  --  8.4*  --   --  8.6*  --   --  8.5* 8.8*  MG 2.3  --  1.9  --  1.9  --   --  2.7*  --   --  2.4  --   --   --   --   --   --   --   PHOS 2.4*   < > 2.5  --  2.9   < >  --  2.5  --   --  2.9  --  1.9*  --   --  4.1  --  4.0   < > = values in this interval not displayed.   Estimated Creatinine Clearance: 119.3 mL/min (by C-G formula based on  SCr of 0.94 mg/dL).   LIVER Recent Labs  Lab 07/20/20 1123 07/20/20 1230 07/21/20 0648 07/22/20 0516 07/24/20 0808 07/25/20 0521  AST 87*  --  89* 95* 123* 164*  ALT 32  --  31 29 31  37  ALKPHOS 153*  --  176* 178* 183* 218*  BILITOT 21.6*  --  24.8* 26.2* 23.9* 24.6*  PROT 5.2*  --  5.6* 5.5* 5.4* 5.7*  ALBUMIN 2.7*  --  2.7* 2.5* 2.1* 2.3*  INR  --  1.9*  --  1.6*  --   --      INFECTIOUS Recent Labs  Lab 07/18/20 1503 07/20/20 1230  LATICACIDVEN 2.5* 1.8     ENDOCRINE CBG (last 3)  Recent Labs    07/24/20 1947 07/24/20 2337 07/25/20 0353  GLUCAP 133* 100* 106*       ATTESTATION & SIGNATURE  CRITICAL CARE Performed by: Johnsie Cancel  Total critical care time: 56minutes  Critical care time was exclusive of separately billable procedures and treating other patients.  Critical care was necessary to treat or prevent imminent or life-threatening deterioration.  Critical care was time spent personally by me on the following activities: development of treatment plan with patient and/or surrogate as well as nursing, discussions with consultants, evaluation of patient's response to treatment, examination of patient, obtaining history from patient or surrogate, ordering and performing treatments and interventions, ordering and review of laboratory studies, ordering and review of radiographic studies, pulse oximetry and re-evaluation of patient's condition.  Johnsie Cancel, NP-C [facility] Contact / Pager information can be found on Amion  07/25/2020, 7:29 AM

## 2020-07-25 NOTE — Progress Notes (Addendum)
Daily Rounding Note  07/25/2020, 11:06 AM  LOS: 9 days   SUBJECTIVE:   Chief complaint: Decompensated cirrhosis.  Extubated  Last night.  Off pressors. Stools in flexiseal are watery, medium to  Light brown,  No melena or blood.    Feels weak.  No  abd pain.  No nausea Diet advanced to D3.    OBJECTIVE:         Vital signs in last 24 hours:    Temp:  [97.9 F (36.6 C)-100.1 F (37.8 C)] 98.8 F (37.1 C) (12/08 0800) Pulse Rate:  [59-104] 104 (12/08 1000) Resp:  [10-32] 16 (12/08 1000) BP: (83-123)/(38-88) 116/74 (12/08 1000) SpO2:  [90 %-100 %] 97 % (12/08 1000) FiO2 (%):  [40 %] 40 % (12/07 1525) Weight:  [100.5 kg] 100.5 kg (12/08 0500) Last BM Date: 07/25/20 Filed Weights   07/23/20 0212 07/24/20 0500 07/25/20 0500  Weight: 98.5 kg 100.5 kg 100.5 kg   General: jaundiced.  Looks ill, tremulous   Heart: RRR Chest: no labored breathing or cough Abdomen: distended, moderately tense,  Hepatomegaly.  No tenderness.  BS normal but hypoactive.  Watery brown stool in flexiseal.    Extensive bruising in the right lower quadrant. Extremities: Nonpitting pedal edema.  Feet are warm. Neuro/Psych: Tremulous.  Hard to discern if he has asterixis or not.  Appropriate.  Oriented x3.  Speech is slow but clear.  Intake/Output from previous day: 12/07 0701 - 12/08 0700 In: 1309.2 [I.V.:395.2; NG/GT:526.7; IV Piggyback:387.4] Out: 2300 [Urine:1350; Stool:950]  Intake/Output this shift: Total I/O In: 10.2 [IV Piggyback:10.2] Out: -   Lab Results: Recent Labs    07/24/20 0540 07/25/20 0521  WBC 22.0* 28.5*  HGB 7.6* 8.2*  HCT 24.3* 26.2*  PLT 94* 134*   BMET Recent Labs    07/23/20 1220 07/24/20 0808 07/25/20 0521  NA 153* 156* 157*  K 3.0* 2.9* 3.4*  CL 120* 122* 123*  CO2 23 23 21*  GLUCOSE 179* 147* 115*  BUN 34* 40* 45*  CREATININE 0.87 0.89 0.94  CALCIUM 8.6* 8.5* 8.8*   LFT Recent Labs     07/24/20 0808 07/25/20 0521  PROT 5.4* 5.7*  ALBUMIN 2.1* 2.3*  AST 123* 164*  ALT 31 37  ALKPHOS 183* 218*  BILITOT 23.9* 24.6*    Studies/Results: DG CHEST PORT 1 VIEW  Result Date: 07/23/2020 CLINICAL DATA:  Repositioning of endotracheal tube. EXAM: PORTABLE CHEST 1 VIEW COMPARISON:  Chest x-ray 07/21/2020 FINDINGS: Endotracheal tube terminates approximately 5.5 cm above the carina. Enteric tube courses below the hemidiaphragm. Right PICC with tip overlying the expected region of the superior vena cava. The heart size and mediastinal contours are within normal limits. Interval improvement of diffuse patchy airspace opacities. Interval marked improved aeration of the left lung. Slightly increased interstitial markings. No pleural effusion. No pneumothorax. No acute osseous abnormality. IMPRESSION: 1. Marked improved aeration of the left lung as well as improved but persistent bilateral patchy airspace opacities. 2. Endotracheal tube with tip terminating 5.5 cm above the carina. 3. Enteric tube courses below diaphragm. 4. Right PICC with tip overlying the expected region of the distal superior vena cava. Electronically Signed   By: Iven Finn M.D.   On: 07/23/2020 23:24   Scheduled Meds: . vitamin C  1,000 mg Oral Daily  . chlorhexidine  15 mL Mouth Rinse BID  . Chlorhexidine Gluconate Cloth  6 each Topical Daily  . cholecalciferol  2,000 Units  Oral Daily  . folic acid  1 mg Oral Daily  . heparin injection (subcutaneous)  5,000 Units Subcutaneous Q8H  . insulin aspart  0-15 Units Subcutaneous Q4H  . ipratropium-albuterol  3 mL Nebulization TID  . lactulose  30 g Oral BID  . mouth rinse  15 mL Mouth Rinse q12n4p  . methylPREDNISolone (SOLU-MEDROL) injection  32 mg Intravenous Daily  . midodrine  10 mg Oral TID  . multivitamin with minerals  1 tablet Oral Daily  . pantoprazole sodium  40 mg Per Tube Daily  . potassium chloride  40 mEq Oral Once  . rifaximin  550 mg Oral BID  .  sodium chloride flush  10-40 mL Intracatheter Q12H  . sodium chloride flush  3 mL Intravenous Q12H  . thiamine  100 mg Oral Daily   Continuous Infusions: . sodium chloride Stopped (07/17/20 1958)  .  ceFAZolin (ANCEF) IV Stopped (07/25/20 0603)  . dexmedetomidine (PRECEDEX) IV infusion Stopped (07/24/20 1554)  . dextrose 100 mL/hr at 07/25/20 1005  . norepinephrine (LEVOPHED) Adult infusion Stopped (07/25/20 0546)  . vasopressin Stopped (07/24/20 1128)   PRN Meds:.sodium chloride, docusate, fentaNYL (SUBLIMAZE) injection, midazolam, polyethylene glycol, Resource ThickenUp Clear, sodium chloride flush    ASSESMENT:   *   Decompensated cirrhosis of the liver due to alcohol. Multiple admissions for ETOH related issues and hepatitis.   MELD 26.   IV steroids for discriminant function > 32. LFTs climbing higher.  Though overall improved T bili.   Lille score 12/5  at day 7 of IV solumedrol was 0.9: likely non-responder to steroids.    *    Hepatic encephalopathy.  Ammonia 71.  Rifaximin, Lactulose in place.    *     Thrombocytopenia.  Improved. 86 >> 134  *   Coagulopathy.  INR 1.9 >> 1.6. no FFP or Vit K thus far.    *     Septic shock.  Staph PNA/aspiration.  E. coli UTI.  Ancef in place. Rising WBCs to 28.5.   On vent.  *     Chronic [place] anemia.  Suspect bone marrow suppression from alcohol and anemia of chronic disease.  Hgb stable.  No PRBCs to date.    *    04/2020 EGD with grade 1, nonbleeding esophageal varices.  Portal hypertensive gastropathy.  Erythematous to adenopathy.  No biopsies obtained.   PLAN   *   Supportive care.  Agree w advancing diet  *   Reiterate ETOH abstinence.    *   Protonix 40 mg p.o. bid, to replace IV.    *   Switch lactulose to 20 g bid.   Continue Xifaxan.  ? When to dc the flexiseal?   *   Can change to prednisolone 40 mg qd for 4 weeks for etoh hepatitis.     Azucena Freed  07/25/2020, 11:06 AM Phone [phone]    Attending  Physician Note   I have taken an interval history, reviewed the chart and examined the patient. I agree with the Advanced Practitioner's note, impression and recommendations.   Decompensated etoh cirrhosis with HE, etoh hepatitis, small non bleeding esophageal varices.   *Lille score indicates he likely is a nonresponder to corticosteroids however if no significant contraindication can continue and change to prednisolone 40 mg po qd for at total corticosteroid course of 4 weeks for etoh hepatitis. If any contraindication would discontinue corticosteroids for etoh hepatitis.  *Change lactulose to 20 g po bid *Continue  Xifaxan 550 mg po bid *Change Protonix to 40 mg po qd *Reinforce need for complete alcohol abstinence  *Continue supportive care *GI signing off. Available if needed for questions, problems.   Lucio Edward, MD [facility]

## 2020-07-25 NOTE — Progress Notes (Signed)
Nutrition Follow-up  DOCUMENTATION CODES:   Not applicable  INTERVENTION:   Ensure Enlive po TID, each supplement provides 350 kcal and 20 grams of protein  Magic cup TID with meals, each supplement provides 290 kcal and 9 grams of protein  Vitamin labs:  Vitamin D: 11.5 - being repleted Vitamin C: <.01 - being repleted  Vitamin labs pending:  Vitamin A  Vitamin B6  NUTRITION DIAGNOSIS:   Increased nutrient needs related to catabolic illness as evidenced by estimated needs. Ongoing.   GOAL:   Patient will meet greater than or equal to 90% of their needs Progressing.   MONITOR:   TF tolerance, Vent status, Labs  REASON FOR ASSESSMENT:   Consult, Ventilator Enteral/tube feeding initiation and management  ASSESSMENT:   Pt with PMH of alcoholic liver disease, severe alcohol abuse, hepatic steatosis, essential hypertension, GERD, PTSD, depression, and schizoaffective disorder admitted after being found down at home with Severe toxic metabolic encephalopathy due to hepatic encephalopathy, severe alcoholic hepatitis, alcoholic cirrhosis, severe hypokalemia, and AKI.   Pt discussed during ICU rounds and with RN.  Pt passed swallow evaluation and started on PO diet.    11/30 imitated TF at 20 ml/hr due to low K+/PO4 12/2 extubated; re-intubated overnight  12/4 pt vomited - TF held 12/5 TF restarted @ 10 ml/hr   Medications reviewed and include: vitamin C 1000 mg daily, vitamin D 1102 units daily, folic acid, SSI, lactulose, solumedrol, MVI with minerals, thiamine  Labs reviewed: Na 157, K+3.4, PO4 1.9 Vitamin D: 11.5 Vitamin C: <.01    Diet Order:   Diet Order            DIET DYS 3 Room service appropriate? Yes with Assist; Fluid consistency: Nectar Thick  Diet effective now                 EDUCATION NEEDS:   No education needs have been identified at this time  Skin:  Skin Assessment: Skin Integrity Issues: Skin Integrity Issues:: Stage I Stage I:  buttocks  Last BM:  1050 ml via rectal tube  Height:   Ht Readings from Last 1 Encounters:  07/16/20 6' (1.829 m)    Weight:   Wt Readings from Last 1 Encounters:  07/25/20 100.5 kg    Ideal Body Weight:  80.9 kg  BMI:  Body mass index is 30.05 kg/m.  Estimated Nutritional Needs:   Kcal:  2200-2400  Protein:  150-175 grams  Fluid:  2 L/day  Lockie Pares., RD, LDN, CNSC See AMiON for contact information

## 2020-07-25 NOTE — Evaluation (Signed)
Clinical/Bedside Swallow Evaluation Patient Details  Name: [name] MRN: [id number] Date of Birth: 12/24/1970  Today's Date: 07/25/2020 Time: SLP Start Time (ACUTE ONLY): 0950 SLP Stop Time (ACUTE ONLY): 1012 SLP Time Calculation (min) (ACUTE ONLY): 22 min  Past Medical History:  Past Medical History:  Diagnosis Date  . Alcohol abuse   . Anxiety    at age 49  . Depression    at age 60  . GERD (gastroesophageal reflux disease)   . Hypertension   . Psychosis ([place])   . PTSD (post-traumatic stress disorder)   . Schizoaffective disorder   . Seizure disorder ([place])    related to etoh seizure  . Symptomatic anemia    Past Surgical History:  Past Surgical History:  Procedure Laterality Date  . ESOPHAGOGASTRODUODENOSCOPY (EGD) WITH PROPOFOL N/A 05/05/2020   Procedure: ESOPHAGOGASTRODUODENOSCOPY (EGD) WITH PROPOFOL;  Surgeon: [name], MD;  Location: [facility];  Service: Endoscopy;  Laterality: N/A;  . FOOT SURGERY     right  . HAND SURGERY     HPI:  49 y.o. male admitted with AMS with encephalopathy and alcholic hepatitis. Intubated in ED on 11/29-12/2, reintubated 12/2-12/7.  PMHx: alcoholism, HTN, GERD, schizoaffective disorder, PTSD.   Assessment / Plan / Recommendation Clinical Impression  Pt exhibits intubation related transient dysphagia. Follows commands, processing/response time slower and needed cues for smaller bites/sips. Immediate cough after multiple sips thin from a straw, less from cup however frequency noted to increase as therapist documenting outside his room; cough is strong (reflexive and volitional). Minimal vocal hoarseness. [place] and transit functional with solid texture. Recommend short term nectar thick liquids, Dys 3 texture, pills whole in puree and full supervision. Goal will be advance from clinical skilled observation or instrumental if needed.     SLP Visit Diagnosis: Dysphagia, unspecified (R13.10)    Aspiration Risk  Mild  aspiration risk;Moderate aspiration risk    Diet Recommendation Dysphagia 3 (Mech soft);Thin liquid   Liquid Administration via: Cup;No straw Medication Administration: Whole meds with puree Supervision: Patient able to self feed;Staff to assist with self feeding;Full supervision/cueing for compensatory strategies Compensations: Slow rate;Small sips/bites Postural Changes: Seated upright at 90 degrees    Other  Recommendations Oral Care Recommendations: Oral care BID   Follow up Recommendations  (TBD)      Frequency and Duration min 2x/week  2 weeks       Prognosis Prognosis for Safe Diet Advancement: Good Barriers to Reach Goals: Other (Comment) (comorbidities)      Swallow Study   General HPI: 49 y.o. male admitted with AMS with encephalopathy and alcholic hepatitis. Intubated in ED on 11/29-12/2, reintubated 12/2-12/7.  PMHx: alcoholism, HTN, GERD, schizoaffective disorder, PTSD. Type of Study: Bedside Swallow Evaluation Previous Swallow Assessment:  (none) Diet Prior to this Study: NPO Temperature Spikes Noted: No Respiratory Status: Nasal cannula History of Recent Intubation: Yes Length of Intubations (days):  (x 2, total 9 days) Date extubated: 07/24/20 Behavior/Cognition: Alert;Cooperative;Pleasant mood;Requires cueing Oral Cavity Assessment: Dry Oral Care Completed by SLP: Yes Oral Cavity - Dentition: Adequate natural dentition;Poor condition Vision: Functional for self-feeding Self-Feeding Abilities: Needs assist Patient Positioning: Upright in bed Baseline Vocal Quality: Hoarse (minimal) Volitional Cough: Strong Volitional Swallow: Able to elicit    Oral/Motor/Sensory Function Overall Oral Motor/Sensory Function: Within functional limits   Ice Chips Ice chips: Not tested   Thin Liquid Thin Liquid: Impaired Presentation: Cup;Straw Oral Phase Impairments: Reduced labial seal Oral Phase Functional Implications: Left anterior spillage;Right anterior  spillage Pharyngeal  Phase Impairments: Cough - Immediate;Cough - Delayed    Nectar Thick Nectar Thick Liquid: Not tested   Honey Thick Honey Thick Liquid: Not tested   Puree Puree: Within functional limits   Solid     Solid: Within functional limits      [name] 07/25/2020,10:39 AM   [name] Caroli.Ed Risk analyst [phone] Office [phone]

## 2020-07-25 NOTE — Progress Notes (Signed)
Rehab Admissions Coordinator Note:  Patient was screened by Cleatrice Burke for appropriateness for an Inpatient Acute Rehab Consult per therapy recs. Noted extubated. At this time, we are recommending Inpatient Rehab consult. Please place rehab consult order if you would like him considered for Cir admit. Please advise.  Cleatrice Burke RN MSN 07/25/2020, 11:51 AM  I can be reached at [phone].

## 2020-07-26 ENCOUNTER — Other Ambulatory Visit: Payer: Self-pay

## 2020-07-26 ENCOUNTER — Inpatient Hospital Stay (HOSPITAL_COMMUNITY): Payer: Medicare Other

## 2020-07-26 LAB — BASIC METABOLIC PANEL
Anion gap: 11 (ref 5–15)
BUN: 38 mg/dL — ABNORMAL HIGH (ref 6–20)
CO2: 21 mmol/L — ABNORMAL LOW (ref 22–32)
Calcium: 8.7 mg/dL — ABNORMAL LOW (ref 8.9–10.3)
Chloride: 120 mmol/L — ABNORMAL HIGH (ref 98–111)
Creatinine, Ser: 0.97 mg/dL (ref 0.61–1.24)
GFR, Estimated: >60 mL/min (ref >60)
Glucose, Bld: 137 mg/dL — ABNORMAL HIGH (ref 70–99)
Potassium: 3.2 mmol/L — ABNORMAL LOW (ref 3.5–5.1)
Sodium: 152 mmol/L — ABNORMAL HIGH (ref 135–145)

## 2020-07-26 LAB — GLUCOSE, CAPILLARY
Glucose-Capillary: 138 mg/dL — ABNORMAL HIGH (ref 70–99)
Glucose-Capillary: 135 mg/dL — ABNORMAL HIGH (ref 70–99)
Glucose-Capillary: 180 mg/dL — ABNORMAL HIGH (ref 70–99)
Glucose-Capillary: 158 mg/dL — ABNORMAL HIGH (ref 70–99)

## 2020-07-26 LAB — VITAMIN B12: Vitamin B-12: 1958 pg/mL — ABNORMAL HIGH (ref 180–914)

## 2020-07-26 LAB — TSH: TSH: 3.266 u[IU]/mL (ref 0.350–4.500)

## 2020-07-26 MED ORDER — LACTULOSE 10 GM/15ML PO SOLN
20.0000 g | Freq: Three times a day (TID) | ORAL | Status: DC
  Administered 2020-07-26 – 2020-08-05 (×31): 20 g via ORAL
  Filled 2020-07-26 (×31): qty 30

## 2020-07-26 MED ORDER — ALUM & MAG HYDROXIDE-SIMETH 200-200-20 MG/5ML PO SUSP
30.0000 mL | Freq: Once | ORAL | Status: DC
Start: 2020-07-26 — End: 2020-07-26

## 2020-07-26 MED ORDER — POTASSIUM CHLORIDE 20 MEQ PO PACK
40.0000 meq | PACK | Freq: Once | ORAL | Status: AC
  Administered 2020-07-26: 40 meq via ORAL
  Filled 2020-07-26: qty 2

## 2020-07-26 MED ORDER — POTASSIUM CHLORIDE 10 MEQ/100ML IV SOLN
10.0000 meq | INTRAVENOUS | Status: AC
  Administered 2020-07-26 (×4): 10 meq via INTRAVENOUS
  Filled 2020-07-26 (×4): qty 100

## 2020-07-26 MED ORDER — ALUM & MAG HYDROXIDE-SIMETH 200-200-20 MG/5ML PO SUSP: 30.0000 mL | Freq: Once | ORAL | Status: DC | PRN

## 2020-07-26 MED ORDER — PREDNISOLONE 5 MG PO TABS
40.0000 mg | ORAL_TABLET | Freq: Every day | ORAL | Status: DC
  Administered 2020-07-27 – 2020-07-30 (×4): 40 mg via ORAL
  Filled 2020-07-26 (×4): qty 8

## 2020-07-26 MED ORDER — IPRATROPIUM-ALBUTEROL 0.5-2.5 (3) MG/3ML IN SOLN
3.0000 mL | Freq: Two times a day (BID) | RESPIRATORY_TRACT | Status: DC
  Administered 2020-07-26 – 2020-07-27 (×3): 3 mL via RESPIRATORY_TRACT
  Filled 2020-07-26 (×3): qty 3

## 2020-07-26 MED ORDER — DM-GUAIFENESIN ER 30-600 MG PO TB12
1.0000 | ORAL_TABLET | Freq: Two times a day (BID) | ORAL | Status: DC | PRN
  Filled 2020-07-26: qty 1

## 2020-07-26 MED ORDER — SERTRALINE HCL 50 MG PO TABS
25.0000 mg | ORAL_TABLET | Freq: Every day | ORAL | Status: DC
  Administered 2020-07-26 – 2020-08-06 (×12): 25 mg via ORAL
  Filled 2020-07-26 (×12): qty 1

## 2020-07-26 MED ORDER — QUETIAPINE FUMARATE 50 MG PO TABS
50.0000 mg | ORAL_TABLET | Freq: Every day | ORAL | Status: DC
  Administered 2020-07-26 – 2020-08-05 (×11): 50 mg via ORAL
  Filled 2020-07-26 (×11): qty 1

## 2020-07-26 MED ORDER — PNEUMOCOCCAL VAC POLYVALENT 25 MCG/0.5ML IJ INJ
0.5000 mL | INJECTION | INTRAMUSCULAR | Status: AC
  Administered 2020-07-27: 0.5 mL via INTRAMUSCULAR
  Filled 2020-07-26: qty 0.5

## 2020-07-26 NOTE — Progress Notes (Signed)
Physical Therapy Treatment Patient Details Name: [name] MRN: [id number] DOB: 22-Sep-1970 Today's Date: 07/26/2020    History of Present Illness 49 y.o. male admitted with AMS with encephalopathy and alcholic hepatitis. Intubated in ED on 11/29, extubated and reintubated 12/2. PMHx: alcoholism, HTN, GERD, schizoaffective disorder    PT Comments    Pt progressing nicely toward goals.  Will update toward mod I.  Emphasis on resisted exercise, transitions to EOB and work on gait stability with RW.    Follow Up Recommendations  Supervision/Assistance - 24 hour;CIR (working toward home.)     Equipment Recommendations  Other (comment)    Recommendations for Other Services       Precautions / Restrictions      Mobility  Bed Mobility Overal bed mobility: Needs Assistance Bed Mobility: Supine to Sit;Sit to Supine     Supine to sit: Mod assist Sit to supine: Mod assist   General bed mobility comments: up via R elbow to get OOB and assist of LE back into bed.  Transfers Overall transfer level: Needs assistance Equipment used: Rolling walker (2 wheeled) Transfers: Sit to/from Stand Sit to Stand: Min assist         General transfer comment: asssit to come forward with minor boost.  Ambulation/Gait Ambulation/Gait assistance: Min assist Gait Distance (Feet): 30 Feet Assistive device: Rolling walker (2 wheeled) Gait Pattern/deviations: Step-through pattern Gait velocity: slow Gait velocity interpretation: <1.31 ft/sec, indicative of household ambulator General Gait Details: mild unsteadiness with tentative steps, but no LOB.  Pt able to self maneuver the RW   Stairs             Wheelchair Mobility    Modified Rankin (Stroke Patients Only)       Balance     Sitting balance-Leahy Scale: Fair       Standing balance-Leahy Scale: Poor Standing balance comment: external assist and/or AD                            Cognition  Arousal/Alertness: Awake/alert Behavior During Therapy: Flat affect Overall Cognitive Status:  (NT formally)                                 General Comments: followed commands well      Exercises Other Exercises Other Exercises: hip/knee flex/ext ROM bil, resisted in gross ext. Other Exercises: bicep/tricep presses resisted x10 with shd flex/ext "reach to ceiling with both concentric/eccentric resisted.    General Comments General comments (skin integrity, edema, etc.): vss overall.  Sats >95% on RA, EHR no greater than 120      Pertinent Vitals/Pain Pain Assessment: Faces Faces Pain Scale: No hurt Pain Location: generalized discomfort Pain Intervention(s): Monitored during session    Home Living Family/patient expects to be discharged to:: Private residence Living Arrangements: Alone                  Prior Function            PT Goals (current goals can now be found in the care plan section) Acute Rehab PT Goals PT Goal Formulation: With patient Time For Goal Achievement: 08/05/20 Potential to Achieve Goals: Fair Progress towards PT goals: Progressing toward goals    Frequency    Min 3X/week      PT Plan Discharge plan needs to be updated    Co-evaluation  AM-PAC PT "6 Clicks" Mobility   Outcome Measure  Help needed turning from your back to your side while in a flat bed without using bedrails?: A Little Help needed moving from lying on your back to sitting on the side of a flat bed without using bedrails?: A Lot Help needed moving to and from a bed to a chair (including a wheelchair)?: A Lot Help needed standing up from a chair using your arms (e.g., wheelchair or bedside chair)?: A Lot Help needed to walk in hospital room?: A Little Help needed climbing 3-5 steps with a railing? : A Lot 6 Click Score: 14    End of Session   Activity Tolerance: Patient tolerated treatment well Patient left: in bed;with call  bell/phone within reach;with bed alarm set;with restraints reapplied;with SCD's reapplied;with nursing/sitter in room Nurse Communication: Mobility status PT Visit Diagnosis: Other abnormalities of gait and mobility (R26.89);Difficulty in walking, not elsewhere classified (R26.2)     Time: 0786-7544 PT Time Calculation (min) (ACUTE ONLY): 31 min  Charges:  $Gait Training: 8-22 mins $Therapeutic Activity: 8-22 mins                     07/26/2020  Ryan Carne., PT Acute Rehabilitation Services [phone]  (pager) [phone]  (office)   [name] 07/26/2020, 1:01 PM

## 2020-07-26 NOTE — Progress Notes (Signed)
Patient arrived to unit in bed. Placed on telemetry, vitals checked. Upon skin assessment RN noted small area of sacral redness that was blanchable. Bruises noted on the arms, legs and abdomen. Patient's skin and eyes severely jaundice. Patient alert and oriented with unremarkable neuro exam.

## 2020-07-26 NOTE — Progress Notes (Signed)
Modified Barium Swallow Progress Note  Patient Details  Name: [name] MRN: [id number] Date of Birth: [birth year]  Today's Date: 07/26/2020  Modified Barium Swallow completed.  Full report located under Chart Review in the Imaging Section.  Brief recommendations include the following:  Clinical Impression  Pt demonstrates mild pharyngeal dysphagia with trace inconsistent aspiration from premature spill and mistimed laryngeal closure. Although it is within normal paramerters for thin liquid to reach the pyriform sinuses, his arytenoid cartilidges had not begun moving to epiglottic petiole to close larynx and thin was penetrated to cords with cup and aspirated with straw. Sensation was not consistent and he demonstrated both cough and no cough in response to cord penetration and aspiration. After first sip consumed during study with thin, he produced a strong cough however no barium observed in vestibule or below cords. Therapist introduced chin tuck then breath hold strategy/maneuver. Chin tuck not effective and no laryngeal intrusion during breath hold. Pt requested to. He was mildy dyspneic and hesitated prior to transiting thin perhaps in attemtps to coordinate respiration and swallow. Although cognition is improving his processing is mildly delayed and recommend he continue nectar thick, Dys 3 texture. ST will target breath hold or supraglottic swallow as a strategy with thin. Pills whole in puree and he required assist for self feeding.   Swallow Evaluation Recommendations       SLP Diet Recommendations: Dysphagia 3 (Mech soft) solids;Nectar thick liquid   Liquid Administration via: Cup;Straw   Medication Administration: Whole meds with puree   Supervision: Staff to assist with self feeding;Full assist for feeding   Compensations: Slow rate;Small sips/bites;Clear throat intermittently   Postural Changes: Seated upright at 90 degrees   Oral Care Recommendations: Oral care BID         [name] 07/26/2020,3:32 PM Orbie Pyo Colvin Caroli.Ed Risk analyst [phone] Office [phone]

## 2020-07-26 NOTE — Progress Notes (Signed)
Inpatient Rehab Admissions:  Inpatient Rehab Consult received.  I met with patient at the bedside for rehabilitation assessment and to discuss goals and expectations of an inpatient rehab admission.  We discussed possible CIR length of stay to be about 1-2 weeks, with goals of supervision.  He notes that he is able to d/c to his sister's home.  She works during the day but his mother is currently here as well and can provide assist.  Note that he is progressing well with PT.  I will not have a bed available for pt to admit over the weekend, so I will f/u on Monday once he's had a chance to work with therapy again.   Signed: Shann Medal, PT, DPT Admissions Coordinator [phone] 07/26/20  3:16 PM

## 2020-07-26 NOTE — Progress Notes (Signed)
PROGRESS NOTE    [name]  MGQ:[id number] DOB: [birth year] DOA: 07/16/2020 PCP: Nicolette Bang, DO   Brief Narrative:  49 year old with history of alcohol use, hepatic steatosis, HTN, GERD, schizoaffective disorder, found down at home for concerns of seizures who was intubated in the ER for airway protection.  Initially noted to have transaminitis, elevated bilirubin, lactic acidosis and leukocytosis.  EKG showed prolonged QT as well.  Ammonia was 207 as well.  GI was consulted.  Patient was initially extubated on 12/2 but due to worsening of his symptoms from aspiration he was reintubated on 12/3.  Then extubated again on 12/7.  Received multiple doses of antibiotics-vancomycin, Flagyl, cefepime, cefazolin.   Assessment & Plan:   Active Problems:   Encephalopathy, hepatic (HCC)   Pressure injury of skin   Acute respiratory failure (HCC)   Septic shock (HCC)   Alcoholic hepatitis with ascites  Decompensated alcohol liver cirrhosis with portal hypertension hepatic encephalopathy and transaminitis -Right upper quadrant ultrasound small volume of ascites with diminished velocities -Started on rifaximin and lactulose.  Follow his mentation -Prednisone 40 mg daily for 4 weeks.  It can be stopped if needed -Following clinically and his lab values -Titrate lactulose to have at least 3 soft bowel movements.  Alcohol abuse -UDS also positive for benzos and THC.  EEG negative for seizure -Alcohol withdrawal protocol -Thiamine, folic acid, multivitamin  Prolonged QTC -Now resolved  Leukocytosis -Secondary to steroid use  Hypernatremia -Currently on D5 water and monitoring sodium levels  Essential hypertension -Not on any meds due to borderline low blood pressure.  Currently on midodrine 10 mg 3 times daily  Schizoaffective disorder -On Seroquel and Zoloft, resume today's QTC is resolved but will periodically have to monitor this.  Acute respiratory failure with  hypoxia requiring intubation -Multifactorial in nature including aspiration pneumonia, concerns of seizure and encephalopathy.  Extubated 12/2 re intubated 12/2.  Eventually reextubated on 12/7.  Completed antibiotic course  Macrocytic anemia -Check TSH, folate and B12  PT-recommended supervision-CIR Speech and swallow-dysphagia 3 diet  DVT prophylaxis: Subcutaneous heparin Code Status: Full code Family Communication:    Status is: Inpatient  Remains inpatient appropriate because:Inpatient level of care appropriate due to severity of illness   Dispo: The patient is from: Home              Anticipated d/c is to: CIR              Anticipated d/c date is: 1 day              Patient currently is not medically stable to d/c.  Ongoing evaluation for his feeding.  Hopefully we can transfer him to CIR tomorrow       Body mass index is 30.05 kg/m.  Pressure Injury 07/16/20 Buttocks Medial Stage 1 -  Intact skin with non-blanchable redness of a localized area usually over a bony prominence. (Active)  07/16/20 2000  Location: Buttocks  Location Orientation: Medial  Staging: Stage 1 -  Intact skin with non-blanchable redness of a localized area usually over a bony prominence.  Wound Description (Comments):   Present on Admission: Yes       Subjective: Seen and examined at bedside, overall doing okay.  Today he is able to tolerate pured diet but not adequate intake.    Examination:  General exam: Appears calm and comfortable  Respiratory system: Clear to auscultation. Respiratory effort normal. Cardiovascular system: S1 & S2 heard, RRR. No JVD, murmurs, rubs, gallops  or clicks. No pedal edema. Gastrointestinal system: Abdomen is distended, soft and nontender. No organomegaly or masses felt. Normal bowel sounds heard. Central nervous system: Alert and oriented. No focal neurological deficits. Extremities: Symmetric 5 x 5 power. Skin: Jaundice, scleral icterus Psychiatry:  Judgement and insight appear normal. Mood & affect appropriate.     Objective: Vitals:   07/26/20 0400 07/26/20 0500 07/26/20 0600 07/26/20 0700  BP: 112/83 118/73 118/77 119/80  Pulse: 98 91 87 92  Resp: 16 (!) 21 (!) 24 (!) 22  Temp: 97.6 F (36.4 C)     TempSrc: Oral     SpO2: 97% 97% 98% 98%  Weight:      Height:        Intake/Output Summary (Last 24 hours) at 07/26/2020 0736 Last data filed at 07/26/2020 0700 Gross per 24 hour  Intake 2494.56 ml  Output 2150 ml  Net 344.56 ml   Filed Weights   07/23/20 0212 07/24/20 0500 07/25/20 0500  Weight: 98.5 kg 100.5 kg 100.5 kg     Data Reviewed:   CBC: Recent Labs  Lab 07/20/20 0758 07/20/20 0907 07/20/20 0920 07/21/20 0648 07/22/20 0516 07/24/20 0540 07/25/20 0521  WBC 21.3*  --   --  19.5* 21.5* 22.0* 28.5*  HGB 7.5*   < > 8.2* 8.1* 7.6* 7.6* 8.2*  HCT 22.5*   < > 24.0* 24.2* 24.4* 24.3* 26.2*  MCV 94.5  --   --  96.4 98.8 103.8* 104.8*  PLT 94*  --   --  86* 95* 94* 134*   < > = values in this interval not displayed.   Basic Metabolic Panel: Recent Labs  Lab 07/19/20 1700 07/19/20 2304 07/20/20 0500 07/20/20 0907 07/20/20 1648 07/21/20 0648 07/22/20 0516 07/22/20 1312 07/23/20 0412 07/23/20 1220 07/24/20 0540 07/24/20 0808 07/25/20 0521 07/26/20 0630  NA  --    < >  --    < >  --    < > 153*  --   --  153*  --  156* 157* 152*  K  --    < >  --    < >  --    < > 2.6* 3.5  --  3.0*  --  2.9* 3.4* 3.2*  CL  --   --   --    < >  --    < > 116*  --   --  120*  --  122* 123* 120*  CO2  --   --   --    < >  --    < > 25  --   --  23  --  23 21* 21*  GLUCOSE  --   --   --    < >  --    < > 142*  --   --  179*  --  147* 115* 137*  BUN  --   --   --    < >  --    < > 37*  --   --  34*  --  40* 45* 38*  CREATININE  --   --   --    < >  --    < > 1.01  --   --  0.87  --  0.89 0.94 0.97  CALCIUM  --   --   --    < >  --    < > 8.4*  --   --  8.6*  --  8.5*  8.8* 8.7*  MG 1.9  --  1.9  --  2.7*  --  2.4  --    --   --   --   --   --   --   PHOS 2.5  --  2.9  --  2.5  --  2.9  --  1.9*  --  4.1  --  4.0  --    < > = values in this interval not displayed.   GFR: Estimated Creatinine Clearance: 115.6 mL/min (by C-G formula based on SCr of 0.97 mg/dL). Liver Function Tests: Recent Labs  Lab 07/20/20 1123 07/21/20 0648 07/22/20 0516 07/24/20 0808 07/25/20 0521  AST 87* 89* 95* 123* 164*  ALT 32 31 29 31  37  ALKPHOS 153* 176* 178* 183* 218*  BILITOT 21.6* 24.8* 26.2* 23.9* 24.6*  PROT 5.2* 5.6* 5.5* 5.4* 5.7*  ALBUMIN 2.7* 2.7* 2.5* 2.1* 2.3*   No results for input(s): LIPASE, AMYLASE in the last 168 hours. Recent Labs  Lab 07/20/20 1230 07/21/20 0648  AMMONIA 77* 71*   Coagulation Profile: Recent Labs  Lab 07/20/20 1230 07/22/20 0516  INR 1.9* 1.6*   Cardiac Enzymes: No results for input(s): CKTOTAL, CKMB, CKMBINDEX, TROPONINI in the last 168 hours. BNP (last 3 results) No results for input(s): PROBNP in the last 8760 hours. HbA1C: No results for input(s): HGBA1C in the last 72 hours. CBG: Recent Labs  Lab 07/25/20 1208 07/25/20 1623 07/25/20 1951 07/25/20 2341 07/26/20 0353  GLUCAP 131* 134* 152* 120* 138*   Lipid Profile: No results for input(s): CHOL, HDL, LDLCALC, TRIG, CHOLHDL, LDLDIRECT in the last 72 hours. Thyroid Function Tests: No results for input(s): TSH, T4TOTAL, FREET4, T3FREE, THYROIDAB in the last 72 hours. Anemia Panel: No results for input(s): VITAMINB12, FOLATE, FERRITIN, TIBC, IRON, RETICCTPCT in the last 72 hours. Sepsis Labs: Recent Labs  Lab 07/20/20 1230  LATICACIDVEN 1.8    Recent Results (from the past 240 hour(s))  Blood Culture (routine x 2)     Status: None   Collection Time: 07/16/20  3:28 PM   Specimen: BLOOD RIGHT HAND  Result Value Ref Range Status   Specimen Description BLOOD RIGHT HAND  Final   Special Requests   Final    BOTTLES DRAWN AEROBIC AND ANAEROBIC Blood Culture adequate volume   Culture   Final    NO GROWTH 5  DAYS Performed at [facility], [place] [address] [place], [place] [address]    Report Status 07/21/2020 FINAL  Final  Blood Culture (routine x 2)     Status: None   Collection Time: 07/16/20  3:29 PM   Specimen: BLOOD  Result Value Ref Range Status   Specimen Description BLOOD LEFT ANTECUBITAL  Final   Special Requests   Final    BOTTLES DRAWN AEROBIC AND ANAEROBIC Blood Culture adequate volume   Culture   Final    NO GROWTH 5 DAYS Performed at [name], [place] [address], [place], [place] [address]    Report Status 07/21/2020 FINAL  Final  Urine culture     Status: Abnormal   Collection Time: 07/16/20  3:41 PM   Specimen: In/Out Cath Urine  Result Value Ref Range Status   Specimen Description IN/OUT CATH URINE  Final   Special Requests   Final    NONE Performed at [facility], [place] [address], [place], [place] [address]    Culture 1,000 COLONIES/mL ESCHERICHIA COLI (A)  Final   Report  Status 07/19/2020 FINAL  Final   Organism ID, Bacteria ESCHERICHIA COLI (A)  Final      Susceptibility   Escherichia coli - MIC*    AMPICILLIN >=32 RESISTANT Resistant     CEFAZOLIN <=4 SENSITIVE Sensitive     CEFEPIME <=0.12 SENSITIVE Sensitive     CEFTRIAXONE <=0.25 SENSITIVE Sensitive     CIPROFLOXACIN >=4 RESISTANT Resistant     GENTAMICIN <=1 SENSITIVE Sensitive     IMIPENEM <=0.25 SENSITIVE Sensitive     NITROFURANTOIN <=16 SENSITIVE Sensitive     TRIMETH/SULFA <=20 SENSITIVE Sensitive     AMPICILLIN/SULBACTAM >=32 RESISTANT Resistant     PIP/TAZO <=4 SENSITIVE Sensitive     * 1,000 COLONIES/mL ESCHERICHIA COLI  Resp Panel by RT-PCR (Flu A&B, Covid) Nasopharyngeal Swab     Status: None   Collection Time: 07/16/20  3:44 PM   Specimen: Nasopharyngeal Swab; Nasopharyngeal(NP) swabs in vial transport medium  Result Value Ref Range Status   SARS Coronavirus 2 by RT PCR NEGATIVE NEGATIVE Final    Comment: (NOTE) SARS-CoV-2 target nucleic acids are NOT DETECTED.  The  SARS-CoV-2 RNA is generally detectable in upper respiratory specimens during the acute phase of infection. The lowest concentration of SARS-CoV-2 viral copies this assay can detect is 138 copies/mL. A negative result does not preclude SARS-Cov-2 infection and should not be used as the sole basis for treatment or other patient management decisions. A negative result may occur with  improper specimen collection/handling, submission of specimen other than nasopharyngeal swab, presence of viral mutation(s) within the areas targeted by this assay, and inadequate number of viral copies(<138 copies/mL). A negative result must be combined with clinical observations, patient history, and epidemiological information. The expected result is Negative.  Fact Sheet for Patients:  EntrepreneurPulse.com.au  Fact Sheet for Healthcare Providers:  IncredibleEmployment.be  This test is no t yet approved or cleared by the [place] FDA and  has been authorized for detection and/or diagnosis of SARS-CoV-2 by FDA under an Emergency Use Authorization (EUA). This EUA will remain  in effect (meaning this test can be used) for the duration of the COVID-19 declaration under Section 564(b)(1) of the Act, 21 U.S.C.section 360bbb-3(b)(1), unless the authorization is terminated  or revoked sooner.       Influenza A by PCR NEGATIVE NEGATIVE Final   Influenza B by PCR NEGATIVE NEGATIVE Final    Comment: (NOTE) The Xpert Xpress SARS-CoV-2/FLU/RSV plus assay is intended as an aid in the diagnosis of influenza from Nasopharyngeal swab specimens and should not be used as a sole basis for treatment. Nasal washings and aspirates are unacceptable for Xpert Xpress SARS-CoV-2/FLU/RSV testing.  Fact Sheet for Patients: EntrepreneurPulse.com.au  Fact Sheet for Healthcare Providers: IncredibleEmployment.be  This test is not yet approved or  cleared by the [place] FDA and has been authorized for detection and/or diagnosis of SARS-CoV-2 by FDA under an Emergency Use Authorization (EUA). This EUA will remain in effect (meaning this test can be used) for the duration of the COVID-19 declaration under Section 564(b)(1) of the Act, 21 U.S.C. section 360bbb-3(b)(1), unless the authorization is terminated or revoked.  Performed at [facility], [place] [address], [place], [place] [address]   MRSA PCR Screening     Status: None   Collection Time: 07/17/20  4:41 AM   Specimen: Nasal Mucosa; Nasopharyngeal  Result Value Ref Range Status   MRSA by PCR NEGATIVE NEGATIVE Final    Comment:        The  GeneXpert MRSA Assay (FDA approved for NASAL specimens only), is one component of a comprehensive MRSA colonization surveillance program. It is not intended to diagnose MRSA infection nor to guide or monitor treatment for MRSA infections. Performed at [facility], [place] [address] [place], [place] [address]   Culture, respiratory (non-expectorated)     Status: None   Collection Time: 07/17/20 12:16 PM   Specimen: Tracheal Aspirate; Respiratory  Result Value Ref Range Status   Specimen Description TRACHEAL ASPIRATE  Final   Special Requests NONE  Final   Gram Stain   Final    FEW WBC PRESENT, PREDOMINANTLY PMN FEW GRAM POSITIVE COCCI RARE GRAM POSITIVE RODS RARE YEAST Performed at [facility], [place] [address], [place], [place] [address]    Culture FEW STAPHYLOCOCCUS AUREUS  Final   Report Status 07/20/2020 FINAL  Final   Organism ID, Bacteria STAPHYLOCOCCUS AUREUS  Final      Susceptibility   Staphylococcus aureus - MIC*    CIPROFLOXACIN <=0.5 SENSITIVE Sensitive     ERYTHROMYCIN <=0.25 SENSITIVE Sensitive     GENTAMICIN <=0.5 SENSITIVE Sensitive     OXACILLIN 0.5 SENSITIVE Sensitive     TETRACYCLINE <=1 SENSITIVE Sensitive     VANCOMYCIN 1 SENSITIVE Sensitive     TRIMETH/SULFA <=10 SENSITIVE  Sensitive     CLINDAMYCIN <=0.25 SENSITIVE Sensitive     RIFAMPIN <=0.5 SENSITIVE Sensitive     Inducible Clindamycin NEGATIVE Sensitive     * FEW STAPHYLOCOCCUS AUREUS  Culture, respiratory (non-expectorated)     Status: None   Collection Time: 07/21/20  1:32 PM   Specimen: Tracheal Aspirate; Respiratory  Result Value Ref Range Status   Specimen Description TRACHEAL ASPIRATE  Final   Special Requests NONE  Final   Gram Stain   Final    MODERATE WBC PRESENT, PREDOMINANTLY PMN NO ORGANISMS SEEN    Culture   Final    NO GROWTH 2 DAYS Performed at [facility], [address], [place], [place] [address]    Report Status 07/23/2020 FINAL  Final         Radiology Studies: No results found.      Scheduled Meds: . vitamin C  1,000 mg Oral Daily  . chlorhexidine  15 mL Mouth Rinse BID  . Chlorhexidine Gluconate Cloth  6 each Topical Daily  . cholecalciferol  2,000 Units Oral Daily  . feeding supplement  237 mL Oral TID BM  . folic acid  1 mg Oral Daily  . heparin injection (subcutaneous)  5,000 Units Subcutaneous Q8H  . insulin aspart  0-15 Units Subcutaneous Q4H  . ipratropium-albuterol  3 mL Nebulization TID  . lactulose  20 g Oral BID  . mouth rinse  15 mL Mouth Rinse q12n4p  . methylPREDNISolone (SOLU-MEDROL) injection  32 mg Intravenous Daily  . midodrine  10 mg Oral TID  . multivitamin with minerals  1 tablet Oral Daily  . pantoprazole  40 mg Oral BID  . [START ON 07/27/2020] pneumococcal 23 valent vaccine  0.5 mL Intramuscular Tomorrow-1000  . rifaximin  550 mg Oral BID  . sodium chloride flush  10-40 mL Intracatheter Q12H  . sodium chloride flush  3 mL Intravenous Q12H  . thiamine  100 mg Oral Daily   Continuous Infusions: . sodium chloride Stopped (07/17/20 1958)  . dexmedetomidine (PRECEDEX) IV infusion Stopped (07/24/20 1554)  . dextrose 100 mL/hr at 07/26/20 0700     LOS: 10 days   Time spent=  35 mins    Chauntae Hults Arsenio Loader, MD [facility]  If 7PM-7AM, please contact night-coverage  07/26/2020, 7:36 AM

## 2020-07-26 NOTE — Progress Notes (Signed)
  Speech Language Pathology Treatment: Dysphagia  Patient Details Name: [name] MRN: [id number] DOB: 1971/03/16 Today's Date: 07/26/2020 Time: 4315-4008 SLP Time Calculation (min) (ACUTE ONLY): 8 min  Assessment / Plan / Recommendation Clinical Impression  Pt states he thinks the thick liquids "are helping and I don't mind them." No s/s with sips thin water and nectar thick juice. Yesterday he had no signs aspiration with thin from straw during assessment, not with cup however significant coughing noted 5-10 min after assessment. Although he appeared safer today, recommend proceeding with instrumental with MBS due to 9 day intubation and variability from clinical observation. MBS scheduled today at 1300.   HPI HPI: 49 y.o. male admitted with AMS with encephalopathy and alcholic hepatitis. Intubated in ED on 11/29-12/2, reintubated 12/2-12/7.  PMHx: alcoholism, HTN, GERD, schizoaffective disorder, PTSD.      SLP Plan  MBS       Recommendations  Diet recommendations: Nectar-thick liquid;Dysphagia 2 (fine chop) Liquids provided via: Cup;No straw Medication Administration: Whole meds with puree Supervision: Patient able to self feed Compensations: Slow rate;Small sips/bites Postural Changes and/or Swallow Maneuvers: Seated upright 90 degrees                General recommendations: Rehab consult Oral Care Recommendations: Oral care BID Follow up Recommendations: Inpatient Rehab SLP Visit Diagnosis: Dysphagia, unspecified (R13.10) Plan: MBS       GO                [name] 07/26/2020, 10:23 AM

## 2020-07-27 LAB — CBC
HCT: 22.7 % — ABNORMAL LOW (ref 39.0–52.0)
Hemoglobin: 7.1 g/dL — ABNORMAL LOW (ref 13.0–17.0)
MCH: 32.4 pg (ref 26.0–34.0)
MCHC: 31.3 g/dL (ref 30.0–36.0)
MCV: 103.7 fL — ABNORMAL HIGH (ref 80.0–100.0)
Platelets: 134 10*3/uL — ABNORMAL LOW (ref 150–400)
RBC: 2.19 MIL/uL — ABNORMAL LOW (ref 4.22–5.81)
RDW: 29.3 % — ABNORMAL HIGH (ref 11.5–15.5)
WBC: 23.4 10*3/uL — ABNORMAL HIGH (ref 4.0–10.5)
nRBC: 0.1 % (ref 0.0–0.2)

## 2020-07-27 LAB — COMPREHENSIVE METABOLIC PANEL
ALT: 58 U/L — ABNORMAL HIGH (ref 0–44)
AST: 187 U/L — ABNORMAL HIGH (ref 15–41)
Albumin: 1.9 g/dL — ABNORMAL LOW (ref 3.5–5.0)
Alkaline Phosphatase: 213 U/L — ABNORMAL HIGH (ref 38–126)
Anion gap: 9 (ref 5–15)
BUN: 29 mg/dL — ABNORMAL HIGH (ref 6–20)
CO2: 20 mmol/L — ABNORMAL LOW (ref 22–32)
Calcium: 8.4 mg/dL — ABNORMAL LOW (ref 8.9–10.3)
Chloride: 118 mmol/L — ABNORMAL HIGH (ref 98–111)
Creatinine, Ser: 0.84 mg/dL (ref 0.61–1.24)
GFR, Estimated: >60 mL/min (ref >60)
Glucose, Bld: 128 mg/dL — ABNORMAL HIGH (ref 70–99)
Potassium: 3.4 mmol/L — ABNORMAL LOW (ref 3.5–5.1)
Sodium: 147 mmol/L — ABNORMAL HIGH (ref 135–145)
Total Bilirubin: 25.7 mg/dL (ref 0.3–1.2)
Total Protein: 5.3 g/dL — ABNORMAL LOW (ref 6.5–8.1)

## 2020-07-27 LAB — MAGNESIUM: Magnesium: 2.2 mg/dL (ref 1.7–2.4)

## 2020-07-27 LAB — VITAMIN B6: Vitamin B6: 1.8 ug/L — ABNORMAL LOW (ref 5.3–46.7)

## 2020-07-27 LAB — GLUCOSE, CAPILLARY: Glucose-Capillary: 123 mg/dL — ABNORMAL HIGH (ref 70–99)

## 2020-07-27 LAB — FOLATE RBC
Folate, Hemolysate: 347 ng/mL
Folate, RBC: 1522 ng/mL (ref >498)
Hematocrit: 22.8 % — ABNORMAL LOW (ref 37.5–51.0)

## 2020-07-27 MED ORDER — POTASSIUM CHLORIDE CRYS ER 20 MEQ PO TBCR
40.0000 meq | EXTENDED_RELEASE_TABLET | Freq: Once | ORAL | Status: AC
  Administered 2020-07-27: 40 meq via ORAL
  Filled 2020-07-27: qty 2

## 2020-07-27 NOTE — Progress Notes (Signed)
Inpatient Rehab Admissions Coordinator:   Will open insurance for prior auth today.  Expect to hear back Monday.  Note beds early next week are limited, so will continue to follow for progress.   Shann Medal, PT, DPT Admissions Coordinator [phone] 07/27/20  12:47 PM

## 2020-07-27 NOTE — Progress Notes (Signed)
PROGRESS NOTE    [name]  WUJ:[id number] DOB: June 30, 1971 DOA: 07/16/2020 PCP: Nicolette Bang, DO   Brief Narrative:  49 year old with history of alcohol use, hepatic steatosis, HTN, GERD, schizoaffective disorder, found down at home for concerns of seizures who was intubated in the ER for airway protection.  Initially noted to have transaminitis, elevated bilirubin, lactic acidosis and leukocytosis.  EKG showed prolonged QT as well.  Ammonia was 207 as well.  GI was consulted.  Patient was initially extubated on 12/2 but due to worsening of his symptoms from aspiration he was reintubated on 12/3.  Then extubated again on 12/7.  Received multiple doses of antibiotics-vancomycin, Flagyl, cefepime, cefazolin.   Assessment & Plan:   Active Problems:   Encephalopathy, hepatic (HCC)   Pressure injury of skin   Acute respiratory failure (HCC)   Septic shock (HCC)   Alcoholic hepatitis with ascites  Decompensated alcohol liver cirrhosis with portal hypertension hepatic encephalopathy and transaminitis -Right upper quadrant ultrasound small volume of ascites with diminished velocities -On rifaximin and lactulose.  Follow his mentation -Prednisone 40 mg daily for 4 weeks.  It can be stopped if needed -Following clinically and his lab values -Titrate lactulose to have at least 3 soft bowel movements.  Discussed with patient's RN  Alcohol abuse -UDS also positive for benzos and THC.  EEG negative for seizure -Alcohol withdrawal protocol -Thiamine, folic acid, multivitamin  Leukocytosis, improving -Secondary to steroid use  Hypernatremia, improving -Currently on D5 water and monitoring sodium levels  Essential hypertension -Not on any meds due to borderline low blood pressure.  Currently on midodrine 10 mg 3 times daily  Schizoaffective disorder -On Seroquel and Zoloft, resume today's QTC is resolved but will periodically have to monitor this.  Acute respiratory  failure with hypoxia requiring intubation -Multifactorial in nature including aspiration pneumonia, concerns of seizure and encephalopathy.  Extubated 12/2 re intubated 12/2.  Eventually reextubated on 12/7.  Completed antibiotic course   Prolonged QTC -Now resolved  Macrocytic anemia, hemoglobin -TSH B12 - normal.  No obvious evidence of bleeding.  Closely monitor hemoglobin, if drops below 7.0, transfuse accordingly -Folate-pending  PT-recommended supervision-CIR Speech and swallow-dysphagia 3 diet  DVT prophylaxis: Subcutaneous heparin Code Status: Full code Family Communication:    Status is: Inpatient  Remains inpatient appropriate because:Inpatient level of care appropriate due to severity of illness   Dispo: The patient is from: Home              Anticipated d/c is to: CIR              Anticipated d/c date is: 1 day              Patient currently is not medically stable to d/c.  Closely monitoring his hemoglobin level and multiple other electrolytes such as sodium, chloride, LFTs.  Hopefully transition to CIR in next 1-2 days       Body mass index is 30.05 kg/m.  Pressure Injury 07/16/20 Buttocks Medial Stage 1 -  Intact skin with non-blanchable redness of a localized area usually over a bony prominence. (Active)  07/16/20 2000  Location: Buttocks  Location Orientation: Medial  Staging: Stage 1 -  Intact skin with non-blanchable redness of a localized area usually over a bony prominence.  Wound Description (Comments):   Present on Admission: Yes       Subjective: No complaints this morning, feels sleepy.  Answers all the questions appropriately.   Examination: Constitutional: Not in acute distress  Respiratory: Clear to auscultation bilaterally Cardiovascular: Normal sinus rhythm, no rubs Abdomen: Nontender +distended good bowel sounds Musculoskeletal: No edema noted Skin: Jaundice, scleral icterus Neurologic: CN 2-12 grossly intact.  And  nonfocal Psychiatric: Normal judgment and insight. Alert and oriented x 3. Normal mood.   Objective: Vitals:   07/26/20 1946 07/26/20 2104 07/27/20 0018 07/27/20 0401  BP:  121/81 114/70 (!) 94/56  Pulse:  92 95 89  Resp:  18 17 18   Temp:  97.9 F (36.6 C) 97.7 F (36.5 C) 97.9 F (36.6 C)  TempSrc:  Oral Oral Oral  SpO2: 96% 93% 93% 95%  Weight:      Height:        Intake/Output Summary (Last 24 hours) at 07/27/2020 0727 Last data filed at 07/26/2020 2312 Gross per 24 hour  Intake 1770.8 ml  Output --  Net 1770.8 ml   Filed Weights   07/23/20 0212 07/24/20 0500 07/25/20 0500  Weight: 98.5 kg 100.5 kg 100.5 kg     Data Reviewed:   CBC: Recent Labs  Lab 07/21/20 0648 07/22/20 0516 07/24/20 0540 07/25/20 0521 07/27/20 0413  WBC 19.5* 21.5* 22.0* 28.5* 23.4*  HGB 8.1* 7.6* 7.6* 8.2* 7.1*  HCT 24.2* 24.4* 24.3* 26.2* 22.7*  MCV 96.4 98.8 103.8* 104.8* 103.7*  PLT 86* 95* 94* 134* 607*   Basic Metabolic Panel: Recent Labs  Lab 07/20/20 1648 07/21/20 0648 07/22/20 0516 07/22/20 1312 07/23/20 0412 07/23/20 1220 07/24/20 0540 07/24/20 0808 07/25/20 0521 07/26/20 0630 07/27/20 0413  NA  --    < > 153*  --   --  153*  --  156* 157* 152* 147*  K  --    < > 2.6*   < >  --  3.0*  --  2.9* 3.4* 3.2* 3.4*  CL  --    < > 116*  --   --  120*  --  122* 123* 120* 118*  CO2  --    < > 25  --   --  23  --  23 21* 21* 20*  GLUCOSE  --    < > 142*  --   --  179*  --  147* 115* 137* 128*  BUN  --    < > 37*  --   --  34*  --  40* 45* 38* 29*  CREATININE  --    < > 1.01  --   --  0.87  --  0.89 0.94 0.97 0.84  CALCIUM  --    < > 8.4*  --   --  8.6*  --  8.5* 8.8* 8.7* 8.4*  MG 2.7*  --  2.4  --   --   --   --   --   --   --  2.2  PHOS 2.5  --  2.9  --  1.9*  --  4.1  --  4.0  --   --    < > = values in this interval not displayed.   GFR: Estimated Creatinine Clearance: 133.5 mL/min (by C-G formula based on SCr of 0.84 mg/dL). Liver Function Tests: Recent Labs  Lab  07/21/20 0648 07/22/20 0516 07/24/20 0808 07/25/20 0521 07/27/20 0413  AST 89* 95* 123* 164* 187*  ALT 31 29 31  37 58*  ALKPHOS 176* 178* 183* 218* 213*  BILITOT 24.8* 26.2* 23.9* 24.6* 25.7*  PROT 5.6* 5.5* 5.4* 5.7* 5.3*  ALBUMIN 2.7* 2.5* 2.1* 2.3* 1.9*   No results for input(s):  LIPASE, AMYLASE in the last 168 hours. Recent Labs  Lab 07/20/20 1230 07/21/20 0648  AMMONIA 77* 71*   Coagulation Profile: Recent Labs  Lab 07/20/20 1230 07/22/20 0516  INR 1.9* 1.6*   Cardiac Enzymes: No results for input(s): CKTOTAL, CKMB, CKMBINDEX, TROPONINI in the last 168 hours. BNP (last 3 results) No results for input(s): PROBNP in the last 8760 hours. HbA1C: No results for input(s): HGBA1C in the last 72 hours. CBG: Recent Labs  Lab 07/26/20 0353 07/26/20 0801 07/26/20 1206 07/26/20 1510 07/27/20 0250  GLUCAP 138* 135* 180* 158* 123*   Lipid Profile: No results for input(s): CHOL, HDL, LDLCALC, TRIG, CHOLHDL, LDLDIRECT in the last 72 hours. Thyroid Function Tests: Recent Labs    07/26/20 0933  TSH 3.266   Anemia Panel: Recent Labs    07/26/20 0933  VITAMINB12 1,958*   Sepsis Labs: Recent Labs  Lab 07/20/20 1230  LATICACIDVEN 1.8    Recent Results (from the past 240 hour(s))  Culture, respiratory (non-expectorated)     Status: None   Collection Time: 07/17/20 12:16 PM   Specimen: Tracheal Aspirate; Respiratory  Result Value Ref Range Status   Specimen Description TRACHEAL ASPIRATE  Final   Special Requests NONE  Final   Gram Stain   Final    FEW WBC PRESENT, PREDOMINANTLY PMN FEW GRAM POSITIVE COCCI RARE GRAM POSITIVE RODS RARE YEAST Performed at [facility], [place] [address], [place], [place] [address]    Culture FEW STAPHYLOCOCCUS AUREUS  Final   Report Status 07/20/2020 FINAL  Final   Organism ID, Bacteria STAPHYLOCOCCUS AUREUS  Final      Susceptibility   Staphylococcus aureus - MIC*    CIPROFLOXACIN <=0.5 SENSITIVE Sensitive      ERYTHROMYCIN <=0.25 SENSITIVE Sensitive     GENTAMICIN <=0.5 SENSITIVE Sensitive     OXACILLIN 0.5 SENSITIVE Sensitive     TETRACYCLINE <=1 SENSITIVE Sensitive     VANCOMYCIN 1 SENSITIVE Sensitive     TRIMETH/SULFA <=10 SENSITIVE Sensitive     CLINDAMYCIN <=0.25 SENSITIVE Sensitive     RIFAMPIN <=0.5 SENSITIVE Sensitive     Inducible Clindamycin NEGATIVE Sensitive     * FEW STAPHYLOCOCCUS AUREUS  Culture, respiratory (non-expectorated)     Status: None   Collection Time: 07/21/20  1:32 PM   Specimen: Tracheal Aspirate; Respiratory  Result Value Ref Range Status   Specimen Description TRACHEAL ASPIRATE  Final   Special Requests NONE  Final   Gram Stain   Final    MODERATE WBC PRESENT, PREDOMINANTLY PMN NO ORGANISMS SEEN    Culture   Final    NO GROWTH 2 DAYS Performed at [facility], [address], [place], [place] [address]    Report Status 07/23/2020 FINAL  Final         Radiology Studies: DG Swallowing Func-Speech Pathology  Result Date: 07/26/2020 Objective Swallowing Evaluation: Type of Study: MBS-Modified Barium Swallow Study  Patient Details Name: Donnavan Covault MRN: [id number] Date of Birth: 1971-06-17 Today's Date: 07/26/2020 Time: SLP Start Time (ACUTE ONLY): 88 -SLP Stop Time (ACUTE ONLY): 1323 SLP Time Calculation (min) (ACUTE ONLY): 20 min Past Medical History: Past Medical History: Diagnosis Date  Alcohol abuse   Anxiety   at age 38  Depression   at age 32  GERD (gastroesophageal reflux disease)   Hypertension   Psychosis ([place])   PTSD (post-traumatic stress disorder)   Schizoaffective disorder   Seizure disorder ([place])   related to etoh seizure  Symptomatic  anemia  Past Surgical History: Past Surgical History: Procedure Laterality Date  ESOPHAGOGASTRODUODENOSCOPY (EGD) WITH PROPOFOL N/A 05/05/2020  Procedure: ESOPHAGOGASTRODUODENOSCOPY (EGD) WITH PROPOFOL;  Surgeon: [name], MD;  Location: [facility];  Service: Endoscopy;  Laterality: N/A;  FOOT  SURGERY    right  HAND SURGERY   HPI: 49 y.o. male admitted with AMS with encephalopathy and alcholic hepatitis. Intubated in ED on 11/29-12/2, reintubated 12/2-12/7.  PMHx: alcoholism, HTN, GERD, schizoaffective disorder, PTSD.  No data recorded Assessment / Plan / Recommendation CHL IP CLINICAL IMPRESSIONS 07/26/2020 Clinical Impression Pt demonstrates mild pharyngeal dysphagia with trace inconsistent aspiration from premature spill and mistimed laryngeal closure. Although it is within normal paramerters for thin liquid to reach the pyriform sinuses, his arytenoid cartilidges had not begun moving to epiglottic petiole to close larynx and thin was penetrated to cords with cup and aspirated with straw. Sensation was not consistent and he demonstrated both cough and no cough in response to cord penetration and aspiration. After first sip consumed during study with thin, he produced a strong cough however no barium observed in vestibule or below cords. Therapist introduced chin tuck then breath hold strategy/maneuver. Chin tuck not effective and no laryngeal intrusion during breath hold. Pt requested to. He was mildy dyspneic and hesitated prior to transiting thin perhaps in attemtps to coordinate respiration and swallow. Although cognition is improving his processing is mildly delayed and recommend he continue nectar thick, Dys 3 texture. ST will target breath hold or supraglottic swallow as a strategy with thin. Pills whole in puree and he required assist for self feeding. SLP Visit Diagnosis Dysphagia, pharyngeal phase (R13.13) Attention and concentration deficit following -- Frontal lobe and executive function deficit following -- Impact on safety and function Mild aspiration risk;Moderate aspiration risk   CHL IP TREATMENT RECOMMENDATION 07/26/2020 Treatment Recommendations Therapy as outlined in treatment plan below   Prognosis 07/26/2020 Prognosis for Safe Diet Advancement Good Barriers to Reach Goals --  Barriers/Prognosis Comment -- CHL IP DIET RECOMMENDATION 07/26/2020 SLP Diet Recommendations Dysphagia 3 (Mech soft) solids;Nectar thick liquid Liquid Administration via Cup;Straw Medication Administration Whole meds with puree Compensations Slow rate;Small sips/bites;Clear throat intermittently Postural Changes Seated upright at 90 degrees   CHL IP OTHER RECOMMENDATIONS 07/26/2020 Recommended Consults -- Oral Care Recommendations Oral care BID Other Recommendations --   CHL IP FOLLOW UP RECOMMENDATIONS 07/26/2020 Follow up Recommendations Inpatient Rehab   CHL IP FREQUENCY AND DURATION 07/26/2020 Speech Therapy Frequency (ACUTE ONLY) min 2x/week Treatment Duration 2 weeks      CHL IP ORAL PHASE 07/26/2020 Oral Phase Impaired Oral - Pudding Teaspoon -- Oral - Pudding Cup -- Oral - Honey Teaspoon -- Oral - Honey Cup -- Oral - Nectar Teaspoon -- Oral - Nectar Cup -- Oral - Nectar Straw -- Oral - Thin Teaspoon -- Oral - Thin Cup Delayed oral transit Oral - Thin Straw Premature spillage Oral - Puree -- Oral - Mech Soft -- Oral - Regular Other (Comment) Oral - Multi-Consistency -- Oral - Pill -- Oral Phase - Comment --  CHL IP PHARYNGEAL PHASE 07/26/2020 Pharyngeal Phase Impaired Pharyngeal- Pudding Teaspoon -- Pharyngeal -- Pharyngeal- Pudding Cup -- Pharyngeal -- Pharyngeal- Honey Teaspoon -- Pharyngeal -- Pharyngeal- Honey Cup -- Pharyngeal -- Pharyngeal- Nectar Teaspoon -- Pharyngeal -- Pharyngeal- Nectar Cup -- Pharyngeal -- Pharyngeal- Nectar Straw -- Pharyngeal -- Pharyngeal- Thin Teaspoon -- Pharyngeal -- Pharyngeal- Thin Cup Penetration/Aspiration during swallow;Compensatory strategies attempted (with notebox);Delayed swallow initiation-pyriform sinuses Pharyngeal Material enters airway, CONTACTS cords and not  ejected out;Material enters airway, passes BELOW cords then ejected out Pharyngeal- Thin Straw Penetration/Aspiration during swallow Pharyngeal Material enters airway, passes BELOW cords without attempt by  patient to eject out (silent aspiration) Pharyngeal- Puree -- Pharyngeal -- Pharyngeal- Mechanical Soft -- Pharyngeal -- Pharyngeal- Regular WFL Pharyngeal -- Pharyngeal- Multi-consistency -- Pharyngeal -- Pharyngeal- Pill -- Pharyngeal -- Pharyngeal Comment --  CHL IP CERVICAL ESOPHAGEAL PHASE 07/26/2020 Cervical Esophageal Phase WFL Pudding Teaspoon -- Pudding Cup -- Honey Teaspoon -- Honey Cup -- Nectar Teaspoon -- Nectar Cup -- Nectar Straw -- Thin Teaspoon -- Thin Cup -- Thin Straw -- Puree -- Mechanical Soft -- Regular -- Multi-consistency -- Pill -- Cervical Esophageal Comment -- [name] 07/26/2020, 3:31 PM  Orbie Pyo Litaker M.Ed Risk analyst [phone] Office [phone]                  Scheduled Meds:  vitamin C  1,000 mg Oral Daily   chlorhexidine  15 mL Mouth Rinse BID   Chlorhexidine Gluconate Cloth  6 each Topical Daily   cholecalciferol  2,000 Units Oral Daily   feeding supplement  237 mL Oral TID BM   folic acid  1 mg Oral Daily   heparin injection (subcutaneous)  5,000 Units Subcutaneous Q8H   ipratropium-albuterol  3 mL Nebulization BID   lactulose  20 g Oral TID   mouth rinse  15 mL Mouth Rinse q12n4p   midodrine  10 mg Oral TID   multivitamin with minerals  1 tablet Oral Daily   pantoprazole  40 mg Oral BID   pneumococcal 23 valent vaccine  0.5 mL Intramuscular Tomorrow-1000   prednisoLONE  40 mg Oral Daily   QUEtiapine  50 mg Oral QHS   rifaximin  550 mg Oral BID   sertraline  25 mg Oral Daily   sodium chloride flush  10-40 mL Intracatheter Q12H   thiamine  100 mg Oral Daily   Continuous Infusions:  sodium chloride Stopped (07/17/20 1958)   dextrose 75 mL/hr at 07/27/20 0252     LOS: 11 days   Time spent= 35 mins    Madisin Hasan Arsenio Loader, MD [facility]  If 7PM-7AM, please contact night-coverage  07/27/2020, 7:27 AM

## 2020-07-27 NOTE — Progress Notes (Signed)
  Speech Language Pathology Treatment: Dysphagia  Patient Details Name: [name] MRN: [id number] DOB: 09-24-1970 Today's Date: 07/27/2020 Time: 1150-1205 SLP Time Calculation (min) (ACUTE ONLY): 15 min  Assessment / Plan / Recommendation Clinical Impression  He independently recalled breath hold strategy which is not necessary with nectar thick liquids. Therapist recommended pt practice strategy only in preparation for transition to thin liquids. Pt cued and stated he actively held breath prior to swallow during session although unable to determine degree or effectiveness clinically. He needed reminders for smaller sips. Pt observed with nectar juice and sips thin water. Reviewed MBS and silent nature of penetration/aspiration. Recommend continue nectar thick, Dys 3 (stated he currently doesn't mind the nectar thick). Therapist will continue and upgrade as able.     HPI HPI: 49 y.o. male admitted with AMS with encephalopathy and alcholic hepatitis. Intubated in ED on 11/29-12/2, reintubated 12/2-12/7.  PMHx: alcoholism, HTN, GERD, schizoaffective disorder, PTSD.      SLP Plan  Continue with current plan of care       Recommendations  Diet recommendations: Dysphagia 3 (mechanical soft);Nectar-thick liquid Liquids provided via: Cup;No straw Medication Administration: Whole meds with puree Supervision: Patient able to self feed Compensations: Slow rate;Small sips/bites;Clear throat intermittently Postural Changes and/or Swallow Maneuvers: Seated upright 90 degrees                Oral Care Recommendations: Oral care BID Follow up Recommendations: Inpatient Rehab SLP Visit Diagnosis: Dysphagia, pharyngeal phase (R13.13) Plan: Continue with current plan of care                       [name] 07/27/2020, 1:08 PM  Orbie Pyo Colvin Caroli.Ed Risk analyst [phone] Office [phone]

## 2020-07-27 NOTE — Progress Notes (Signed)
PT Cancellation Note  Patient Details Name: [name] MRN: [id number] DOB: 11-14-1970   Cancelled Treatment:    Reason Eval/Treat Not Completed: Patient declined, no reason specified;Fatigue/lethargy limiting ability to participate. PT attempts to see patient twice on this date, pt declines both times citing fatigue. Pt reports he remains exhausted at this time, especially after PT session yesterday. PT encourages performance of HEP and transferring out of bed with nursing assistance over the weekend. PT will follow up per POC.   Zenaida Niece 07/27/2020, 3:40 PM

## 2020-07-27 NOTE — Plan of Care (Signed)
  Problem: Education: Goal: Knowledge of General Education information will improve Description: Including pain rating scale, medication(s)/side effects and non-pharmacologic comfort measures Outcome: Progressing   Problem: Health Behavior/Discharge Planning: Goal: Ability to manage health-related needs will improve Outcome: Progressing   Problem: Clinical Measurements: Goal: Ability to maintain clinical measurements within normal limits will improve Outcome: Progressing Goal: Will remain free from infection Outcome: Progressing Goal: Diagnostic test results will improve Outcome: Progressing Goal: Respiratory complications will improve Outcome: Progressing Goal: Cardiovascular complication will be avoided Outcome: Progressing   Problem: Activity: Goal: Risk for activity intolerance will decrease Outcome: Progressing   Problem: Nutrition: Goal: Adequate nutrition will be maintained Outcome: Progressing   Problem: Coping: Goal: Level of anxiety will decrease Outcome: Progressing   Problem: Elimination: Goal: Will not experience complications related to bowel motility Outcome: Progressing Goal: Will not experience complications related to urinary retention Outcome: Progressing   Problem: Pain Managment: Goal: General experience of comfort will improve Outcome: Progressing   Problem: Safety: Goal: Ability to remain free from injury will improve Outcome: Progressing   Problem: Skin Integrity: Goal: Risk for impaired skin integrity will decrease Outcome: Progressing   Problem: Education: Goal: Expressions of having a comfortable level of knowledge regarding the disease process will increase Outcome: Progressing   Problem: Coping: Goal: Ability to adjust to condition or change in health will improve Outcome: Progressing Goal: Ability to identify appropriate support needs will improve Outcome: Progressing   Problem: Health Behavior/Discharge Planning: Goal:  Compliance with prescribed medication regimen will improve Outcome: Progressing   Problem: Medication: Goal: Risk for medication side effects will decrease Outcome: Progressing   Problem: Clinical Measurements: Goal: Complications related to the disease process, condition or treatment will be avoided or minimized Outcome: Progressing Goal: Diagnostic test results will improve Outcome: Progressing   Problem: Safety: Goal: Verbalization of understanding the information provided will improve Outcome: Progressing   Problem: Self-Concept: Goal: Level of anxiety will decrease Outcome: Progressing Goal: Ability to verbalize feelings about condition will improve Outcome: Progressing   Problem: Activity: Goal: Ability to tolerate increased activity will improve Outcome: Progressing   Problem: Respiratory: Goal: Ability to maintain a clear airway and adequate ventilation will improve Outcome: Progressing   Problem: Role Relationship: Goal: Method of communication will improve Outcome: Progressing   Problem: Education: Goal: Knowledge of disease or condition will improve Outcome: Progressing Goal: Understanding of discharge needs will improve Outcome: Progressing   Problem: Health Behavior/Discharge Planning: Goal: Ability to identify changes in lifestyle to reduce recurrence of condition will improve Outcome: Progressing Goal: Identification of resources available to assist in meeting health care needs will improve Outcome: Progressing   Problem: Physical Regulation: Goal: Complications related to the disease process, condition or treatment will be avoided or minimized Outcome: Progressing   Problem: Safety: Goal: Ability to remain free from injury will improve Outcome: Progressing

## 2020-07-28 LAB — CBC
HCT: 20.8 % — ABNORMAL LOW (ref 39.0–52.0)
Hemoglobin: 6.8 g/dL — CL (ref 13.0–17.0)
MCH: 33.8 pg (ref 26.0–34.0)
MCHC: 32.7 g/dL (ref 30.0–36.0)
MCV: 103.5 fL — ABNORMAL HIGH (ref 80.0–100.0)
Platelets: 143 10*3/uL — ABNORMAL LOW (ref 150–400)
RBC: 2.01 MIL/uL — ABNORMAL LOW (ref 4.22–5.81)
RDW: 28.9 % — ABNORMAL HIGH (ref 11.5–15.5)
WBC: 19.3 10*3/uL — ABNORMAL HIGH (ref 4.0–10.5)
nRBC: 0.2 % (ref 0.0–0.2)

## 2020-07-28 LAB — COMPREHENSIVE METABOLIC PANEL
ALT: 77 U/L — ABNORMAL HIGH (ref 0–44)
AST: 208 U/L — ABNORMAL HIGH (ref 15–41)
Albumin: 1.9 g/dL — ABNORMAL LOW (ref 3.5–5.0)
Alkaline Phosphatase: 199 U/L — ABNORMAL HIGH (ref 38–126)
Anion gap: 8 (ref 5–15)
BUN: 25 mg/dL — ABNORMAL HIGH (ref 6–20)
CO2: 19 mmol/L — ABNORMAL LOW (ref 22–32)
Calcium: 8.3 mg/dL — ABNORMAL LOW (ref 8.9–10.3)
Chloride: 116 mmol/L — ABNORMAL HIGH (ref 98–111)
Creatinine, Ser: 0.89 mg/dL (ref 0.61–1.24)
GFR, Estimated: >60 mL/min (ref >60)
Glucose, Bld: 124 mg/dL — ABNORMAL HIGH (ref 70–99)
Potassium: 3.5 mmol/L (ref 3.5–5.1)
Sodium: 143 mmol/L (ref 135–145)
Total Bilirubin: 26.8 mg/dL (ref 0.3–1.2)
Total Protein: 5 g/dL — ABNORMAL LOW (ref 6.5–8.1)

## 2020-07-28 LAB — PREPARE RBC (CROSSMATCH)

## 2020-07-28 LAB — VITAMIN A: Vitamin A (Retinoic Acid): <2.5 ug/dL — ABNORMAL LOW (ref 20.1–62.0)

## 2020-07-28 LAB — MAGNESIUM: Magnesium: 2.2 mg/dL (ref 1.7–2.4)

## 2020-07-28 MED ORDER — MIDODRINE HCL 5 MG PO TABS
10.0000 mg | ORAL_TABLET | Freq: Three times a day (TID) | ORAL | Status: DC
  Administered 2020-07-29 – 2020-08-06 (×25): 10 mg via ORAL
  Filled 2020-07-28 (×25): qty 2

## 2020-07-28 MED ORDER — POTASSIUM CHLORIDE CRYS ER 20 MEQ PO TBCR
40.0000 meq | EXTENDED_RELEASE_TABLET | Freq: Once | ORAL | Status: AC
  Administered 2020-07-28: 40 meq via ORAL
  Filled 2020-07-28: qty 2

## 2020-07-28 MED ORDER — SODIUM CHLORIDE 0.9% IV SOLUTION: Freq: Once | INTRAVENOUS | Status: AC

## 2020-07-28 MED ORDER — DOCUSATE SODIUM 100 MG PO CAPS: 100.0000 mg | ORAL_CAPSULE | Freq: Two times a day (BID) | ORAL | Status: DC | PRN

## 2020-07-28 MED ORDER — POLYETHYLENE GLYCOL 3350 17 G PO PACK: 17.0000 g | PACK | Freq: Every day | ORAL | Status: DC | PRN

## 2020-07-28 MED ORDER — IPRATROPIUM-ALBUTEROL 0.5-2.5 (3) MG/3ML IN SOLN: 3.0000 mL | RESPIRATORY_TRACT | Status: DC | PRN

## 2020-07-28 NOTE — Progress Notes (Signed)
PROGRESS NOTE    [name]  VHQ:[id number] DOB: 1970/12/26 DOA: 07/16/2020 PCP: Nicolette Bang, DO   Brief Narrative:  49 year old with history of alcohol use, hepatic steatosis, HTN, GERD, schizoaffective disorder, found down at home for concerns of seizures who was intubated in the ER for airway protection.  Initially noted to have transaminitis, elevated bilirubin, lactic acidosis and leukocytosis.  EKG showed prolonged QT as well.  Ammonia was 207 as well.  GI was consulted.  Patient was initially extubated on 12/2 but due to worsening of his symptoms from aspiration he was reintubated on 12/3.  Then extubated again on 12/7.  Received multiple doses of antibiotics-vancomycin, Flagyl, cefepime, cefazolin.  Antibiotics were stopped.  Prednisone was continued.  Due to slight drop in hemoglobin without any overt signs of bleeding he received 1 unit PRBC.   Assessment & Plan:   Active Problems:   Encephalopathy, hepatic (HCC)   Pressure injury of skin   Acute respiratory failure (HCC)   Septic shock (HCC)   Alcoholic hepatitis with ascites  Decompensated alcohol liver cirrhosis with portal hypertension hepatic encephalopathy and transaminitis -Right upper quadrant ultrasound small volume of ascites with diminished velocities -Continue rifaximin.  Lactulose-follow mentation adjust as needed.  Need to have 3-4 bowel movements daily -Prednisone 40 mg daily for 4 weeks.  It can be stopped if needed -Following clinically and his lab values -Titrate lactulose to have at least 3 soft bowel movements.  Discussed with patient's RN  Alcohol abuse -UDS also positive for benzos and THC.  EEG negative for seizure -Alcohol withdrawal protocol -Thiamine, folic acid, multivitamin  Leukocytosis, improving -Improving, due to steroid use  Hypernatremia, improving -Currently on D5 water, diet last day.  Essential hypertension -Not on any meds due to borderline low blood pressure.   Currently on midodrine 10 mg 3 times daily  Schizoaffective disorder -On Seroquel and Zoloft-resumed.  QTC resolved  Acute respiratory failure with hypoxia requiring intubation -Multifactorial in nature including aspiration pneumonia, concerns of seizure and encephalopathy.  Extubated 12/2 re intubated 12/2.  Eventually reextubated on 12/7.  Completed antibiotic course  Prolonged QTC -Now resolved  Macrocytic anemia, hemoglobin-6.8 -TSH B12 - normal.  No obvious evidence of bleeding.  Closely monitor hemoglobin, if drops below 7.0, transfuse accordingly -Folate-normal  PT-recommended supervision-CIR Speech and swallow-dysphagia 3 diet  DVT prophylaxis: Subcutaneous heparin Code Status: Full code Family Communication: Mother updated by me on 12/10  Status is: Inpatient  Remains inpatient appropriate because:Inpatient level of care appropriate due to severity of illness   Dispo: The patient is from: Home              Anticipated d/c is to: CIR              Anticipated d/c date is: 1 day              Patient currently is not medically stable to d/c.  Closely monitoring his hemoglobin level and multiple other electrolytes such as sodium, chloride, LFTs.  Hopefully transition to CIR in next 1-2 days       Body mass index is 30.05 kg/m.  Pressure Injury 07/16/20 Buttocks Medial Stage 1 -  Intact skin with non-blanchable redness of a localized area usually over a bony prominence. (Active)  07/16/20 2000  Location: Buttocks  Location Orientation: Medial  Staging: Stage 1 -  Intact skin with non-blanchable redness of a localized area usually over a bony prominence.  Wound Description (Comments):   Present on Admission:  Yes       Subjective: Doing okay no complaints.  Agreeable to 1 unit PRBC transfusion   Examination: Constitutional: Not in acute distress Respiratory: Clear to auscultation bilaterally Cardiovascular: Normal sinus rhythm, no rubs Abdomen: Nontender +  distended good bowel sounds Musculoskeletal: No edema noted Skin: Jaundice and scleral icterus Neurologic: CN 2-12 grossly intact.  And nonfocal Psychiatric: Normal judgment and insight. Alert and oriented x 3. Normal mood. Objective: Vitals:   07/27/20 2028 07/27/20 2328 07/28/20 0459 07/28/20 0502  BP:  123/74 101/74   Pulse:  75 76   Resp:  18 18   Temp:  97.7 F (36.5 C) 97.6 F (36.4 C)   TempSrc:  Oral Oral   SpO2: 100% 98% 98%   Weight:    105.6 kg  Height:        Intake/Output Summary (Last 24 hours) at 07/28/2020 0729 Last data filed at 07/27/2020 1100 Gross per 24 hour  Intake 240 ml  Output 210 ml  Net 30 ml   Filed Weights   07/24/20 0500 07/25/20 0500 07/28/20 0502  Weight: 100.5 kg 100.5 kg 105.6 kg     Data Reviewed:   CBC: Recent Labs  Lab 07/22/20 0516 07/24/20 0540 07/25/20 0521 07/26/20 0933 07/27/20 0413 07/28/20 0443  WBC 21.5* 22.0* 28.5*  --  23.4* 19.3*  HGB 7.6* 7.6* 8.2*  --  7.1* 6.8*  HCT 24.4* 24.3* 26.2* 22.8* 22.7* 20.8*  MCV 98.8 103.8* 104.8*  --  103.7* 103.5*  PLT 95* 94* 134*  --  134* 540*   Basic Metabolic Panel: Recent Labs  Lab 07/22/20 0516 07/22/20 1312 07/23/20 0412 07/23/20 1220 07/24/20 0540 07/24/20 0808 07/25/20 0521 07/26/20 0630 07/27/20 0413 07/28/20 0443  NA 153*  --   --    < >  --  156* 157* 152* 147* 143  K 2.6*   < >  --    < >  --  2.9* 3.4* 3.2* 3.4* 3.5  CL 116*  --   --    < >  --  122* 123* 120* 118* 116*  CO2 25  --   --    < >  --  23 21* 21* 20* 19*  GLUCOSE 142*  --   --    < >  --  147* 115* 137* 128* 124*  BUN 37*  --   --    < >  --  40* 45* 38* 29* 25*  CREATININE 1.01  --   --    < >  --  0.89 0.94 0.97 0.84 0.89  CALCIUM 8.4*  --   --    < >  --  8.5* 8.8* 8.7* 8.4* 8.3*  MG 2.4  --   --   --   --   --   --   --  2.2 2.2  PHOS 2.9  --  1.9*  --  4.1  --  4.0  --   --   --    < > = values in this interval not displayed.   GFR: Estimated Creatinine Clearance: 128.9 mL/min  (by C-G formula based on SCr of 0.89 mg/dL). Liver Function Tests: Recent Labs  Lab 07/22/20 0516 07/24/20 0808 07/25/20 0521 07/27/20 0413 07/28/20 0443  AST 95* 123* 164* 187* 208*  ALT 29 31 37 58* 77*  ALKPHOS 178* 183* 218* 213* 199*  BILITOT 26.2* 23.9* 24.6* 25.7* 26.8*  PROT 5.5* 5.4* 5.7* 5.3* 5.0*  ALBUMIN 2.5*  2.1* 2.3* 1.9* 1.9*   No results for input(s): LIPASE, AMYLASE in the last 168 hours. No results for input(s): AMMONIA in the last 168 hours. Coagulation Profile: Recent Labs  Lab 07/22/20 0516  INR 1.6*   Cardiac Enzymes: No results for input(s): CKTOTAL, CKMB, CKMBINDEX, TROPONINI in the last 168 hours. BNP (last 3 results) No results for input(s): PROBNP in the last 8760 hours. HbA1C: No results for input(s): HGBA1C in the last 72 hours. CBG: Recent Labs  Lab 07/26/20 0353 07/26/20 0801 07/26/20 1206 07/26/20 1510 07/27/20 0250  GLUCAP 138* 135* 180* 158* 123*   Lipid Profile: No results for input(s): CHOL, HDL, LDLCALC, TRIG, CHOLHDL, LDLDIRECT in the last 72 hours. Thyroid Function Tests: Recent Labs    07/26/20 0933  TSH 3.266   Anemia Panel: Recent Labs    07/26/20 0933  VITAMINB12 1,958*   Sepsis Labs: No results for input(s): PROCALCITON, LATICACIDVEN in the last 168 hours.  Recent Results (from the past 240 hour(s))  Culture, respiratory (non-expectorated)     Status: None   Collection Time: 07/21/20  1:32 PM   Specimen: Tracheal Aspirate; Respiratory  Result Value Ref Range Status   Specimen Description TRACHEAL ASPIRATE  Final   Special Requests NONE  Final   Gram Stain   Final    MODERATE WBC PRESENT, PREDOMINANTLY PMN NO ORGANISMS SEEN    Culture   Final    NO GROWTH 2 DAYS Performed at [facility], [address], [place], [place] [address]    Report Status 07/23/2020 FINAL  Final         Radiology Studies: DG Swallowing Func-Speech Pathology  Result Date: 07/26/2020 Objective Swallowing  Evaluation: Type of Study: MBS-Modified Barium Swallow Study  Patient Details Name: Boen Sterbenz MRN: [id number] Date of Birth: 1971-01-19 Today's Date: 07/26/2020 Time: SLP Start Time (ACUTE ONLY): 8676 -SLP Stop Time (ACUTE ONLY): 1950 SLP Time Calculation (min) (ACUTE ONLY): 20 min Past Medical History: Past Medical History: Diagnosis Date . Alcohol abuse  . Anxiety   at age 32 . Depression   at age 63 . GERD (gastroesophageal reflux disease)  . Hypertension  . Psychosis ([place])  . PTSD (post-traumatic stress disorder)  . Schizoaffective disorder  . Seizure disorder ([place])   related to etoh seizure . Symptomatic anemia  Past Surgical History: Past Surgical History: Procedure Laterality Date . ESOPHAGOGASTRODUODENOSCOPY (EGD) WITH PROPOFOL N/A 05/05/2020  Procedure: ESOPHAGOGASTRODUODENOSCOPY (EGD) WITH PROPOFOL;  Surgeon: [name], MD;  Location: [facility] END[facility]COPY;  Service: Endoscopy;  Laterality: N/A; . FOOT SURGERY    right . HAND SURGERY   HPI: 49 y.o. male admitted with AMS with encephalopathy and alcholic hepatitis. Intubated in ED on 11/29-12/2, reintubated 12/2-12/7.  PMHx: alcoholism, HTN, GERD, schizoaffective disorder, PTSD.  No data recorded Assessment / Plan / Recommendation CHL IP CLINICAL IMPRESSIONS 07/26/2020 Clinical Impression Pt demonstrates mild pharyngeal dysphagia with trace inconsistent aspiration from premature spill and mistimed laryngeal closure. Although it is within normal paramerters for thin liquid to reach the pyriform sinuses, his arytenoid cartilidges had not begun moving to epiglottic petiole to close larynx and thin was penetrated to cords with cup and aspirated with straw. Sensation was not consistent and he demonstrated both cough and no cough in response to cord penetration and aspiration. After first sip consumed during study with thin, he produced a strong cough however no barium observed in vestibule or below cords. Therapist introduced chin tuck then breath hold  strategy/maneuver. Chin tuck not effective  and no laryngeal intrusion during breath hold. Pt requested to. He was mildy dyspneic and hesitated prior to transiting thin perhaps in attemtps to coordinate respiration and swallow. Although cognition is improving his processing is mildly delayed and recommend he continue nectar thick, Dys 3 texture. ST will target breath hold or supraglottic swallow as a strategy with thin. Pills whole in puree and he required assist for self feeding. SLP Visit Diagnosis Dysphagia, pharyngeal phase (R13.13) Attention and concentration deficit following -- Frontal lobe and executive function deficit following -- Impact on safety and function Mild aspiration risk;Moderate aspiration risk   CHL IP TREATMENT RECOMMENDATION 07/26/2020 Treatment Recommendations Therapy as outlined in treatment plan below   Prognosis 07/26/2020 Prognosis for Safe Diet Advancement Good Barriers to Reach Goals -- Barriers/Prognosis Comment -- CHL IP DIET RECOMMENDATION 07/26/2020 SLP Diet Recommendations Dysphagia 3 (Mech soft) solids;Nectar thick liquid Liquid Administration via Cup;Straw Medication Administration Whole meds with puree Compensations Slow rate;Small sips/bites;Clear throat intermittently Postural Changes Seated upright at 90 degrees   CHL IP OTHER RECOMMENDATIONS 07/26/2020 Recommended Consults -- Oral Care Recommendations Oral care BID Other Recommendations --   CHL IP FOLLOW UP RECOMMENDATIONS 07/26/2020 Follow up Recommendations Inpatient Rehab   CHL IP FREQUENCY AND DURATION 07/26/2020 Speech Therapy Frequency (ACUTE ONLY) min 2x/week Treatment Duration 2 weeks      CHL IP ORAL PHASE 07/26/2020 Oral Phase Impaired Oral - Pudding Teaspoon -- Oral - Pudding Cup -- Oral - Honey Teaspoon -- Oral - Honey Cup -- Oral - Nectar Teaspoon -- Oral - Nectar Cup -- Oral - Nectar Straw -- Oral - Thin Teaspoon -- Oral - Thin Cup Delayed oral transit Oral - Thin Straw Premature spillage Oral - Puree -- Oral - Mech  Soft -- Oral - Regular Other (Comment) Oral - Multi-Consistency -- Oral - Pill -- Oral Phase - Comment --  CHL IP PHARYNGEAL PHASE 07/26/2020 Pharyngeal Phase Impaired Pharyngeal- Pudding Teaspoon -- Pharyngeal -- Pharyngeal- Pudding Cup -- Pharyngeal -- Pharyngeal- Honey Teaspoon -- Pharyngeal -- Pharyngeal- Honey Cup -- Pharyngeal -- Pharyngeal- Nectar Teaspoon -- Pharyngeal -- Pharyngeal- Nectar Cup -- Pharyngeal -- Pharyngeal- Nectar Straw -- Pharyngeal -- Pharyngeal- Thin Teaspoon -- Pharyngeal -- Pharyngeal- Thin Cup Penetration/Aspiration during swallow;Compensatory strategies attempted (with notebox);Delayed swallow initiation-pyriform sinuses Pharyngeal Material enters airway, CONTACTS cords and not ejected out;Material enters airway, passes BELOW cords then ejected out Pharyngeal- Thin Straw Penetration/Aspiration during swallow Pharyngeal Material enters airway, passes BELOW cords without attempt by patient to eject out (silent aspiration) Pharyngeal- Puree -- Pharyngeal -- Pharyngeal- Mechanical Soft -- Pharyngeal -- Pharyngeal- Regular WFL Pharyngeal -- Pharyngeal- Multi-consistency -- Pharyngeal -- Pharyngeal- Pill -- Pharyngeal -- Pharyngeal Comment --  CHL IP CERVICAL ESOPHAGEAL PHASE 07/26/2020 Cervical Esophageal Phase WFL Pudding Teaspoon -- Pudding Cup -- Honey Teaspoon -- Honey Cup -- Nectar Teaspoon -- Nectar Cup -- Nectar Straw -- Thin Teaspoon -- Thin Cup -- Thin Straw -- Puree -- Mechanical Soft -- Regular -- Multi-consistency -- Pill -- Cervical Esophageal Comment -- [name] 07/26/2020, 3:31 PM  Orbie Pyo Litaker M.Ed Actor Pager [phone] Office [phone]                  Scheduled Meds: . sodium chloride   Intravenous Once  . vitamin C  1,000 mg Oral Daily  . chlorhexidine  15 mL Mouth Rinse BID  . Chlorhexidine Gluconate Cloth  6 each Topical Daily  . cholecalciferol  2,000 Units Oral Daily  . feeding supplement  237 mL  Oral TID BM  .  folic acid  1 mg Oral Daily  . heparin injection (subcutaneous)  5,000 Units Subcutaneous Q8H  . ipratropium-albuterol  3 mL Nebulization BID  . lactulose  20 g Oral TID  . mouth rinse  15 mL Mouth Rinse q12n4p  . midodrine  10 mg Oral TID  . multivitamin with minerals  1 tablet Oral Daily  . pantoprazole  40 mg Oral BID  . potassium chloride  40 mEq Oral Once  . prednisoLONE  40 mg Oral Daily  . QUEtiapine  50 mg Oral QHS  . rifaximin  550 mg Oral BID  . sertraline  25 mg Oral Daily  . sodium chloride flush  10-40 mL Intracatheter Q12H  . thiamine  100 mg Oral Daily   Continuous Infusions: . sodium chloride Stopped (07/17/20 1958)  . dextrose 75 mL/hr at 07/27/20 1756     LOS: 12 days   Time spent= 35 mins    Lauraine Crespo Arsenio Loader, MD [facility]  If 7PM-7AM, please contact night-coverage  07/28/2020, 7:29 AM

## 2020-07-28 NOTE — Progress Notes (Signed)
Hgb 6.8, Dr. Marlowe Sax informed.

## 2020-07-28 NOTE — Progress Notes (Signed)
OT Cancellation Note  Patient Details Name: [name] MRN: [id number] DOB: August 16, 1971   Cancelled Treatment:    Reason Eval/Treat Not Completed: Medical issues which prohibited therapy (Hb of 6.8 and deciding on transfusion.)  Jaci Carrel 07/28/2020, 9:09 AM

## 2020-07-28 NOTE — Plan of Care (Signed)
  Problem: Education: Goal: Knowledge of General Education information will improve Description: Including pain rating scale, medication(s)/side effects and non-pharmacologic comfort measures Outcome: Progressing   Problem: Health Behavior/Discharge Planning: Goal: Ability to manage health-related needs will improve Outcome: Progressing   Problem: Clinical Measurements: Goal: Ability to maintain clinical measurements within normal limits will improve Outcome: Progressing Goal: Will remain free from infection Outcome: Progressing Goal: Diagnostic test results will improve Outcome: Progressing Goal: Respiratory complications will improve Outcome: Progressing Goal: Cardiovascular complication will be avoided Outcome: Progressing   Problem: Activity: Goal: Risk for activity intolerance will decrease Outcome: Progressing   Problem: Nutrition: Goal: Adequate nutrition will be maintained Outcome: Progressing   Problem: Coping: Goal: Level of anxiety will decrease Outcome: Progressing   Problem: Elimination: Goal: Will not experience complications related to bowel motility Outcome: Progressing Goal: Will not experience complications related to urinary retention Outcome: Progressing   Problem: Pain Managment: Goal: General experience of comfort will improve Outcome: Progressing   Problem: Safety: Goal: Ability to remain free from injury will improve Outcome: Progressing   Problem: Skin Integrity: Goal: Risk for impaired skin integrity will decrease Outcome: Progressing   Problem: Education: Goal: Expressions of having a comfortable level of knowledge regarding the disease process will increase Outcome: Progressing   Problem: Coping: Goal: Ability to adjust to condition or change in health will improve Outcome: Progressing Goal: Ability to identify appropriate support needs will improve Outcome: Progressing   Problem: Health Behavior/Discharge Planning: Goal:  Compliance with prescribed medication regimen will improve Outcome: Progressing   Problem: Medication: Goal: Risk for medication side effects will decrease Outcome: Progressing   Problem: Clinical Measurements: Goal: Complications related to the disease process, condition or treatment will be avoided or minimized Outcome: Progressing Goal: Diagnostic test results will improve Outcome: Progressing   Problem: Safety: Goal: Verbalization of understanding the information provided will improve Outcome: Progressing   Problem: Self-Concept: Goal: Level of anxiety will decrease Outcome: Progressing Goal: Ability to verbalize feelings about condition will improve Outcome: Progressing   Problem: Education: Goal: Knowledge of disease or condition will improve Outcome: Progressing Goal: Understanding of discharge needs will improve Outcome: Progressing   Problem: Health Behavior/Discharge Planning: Goal: Ability to identify changes in lifestyle to reduce recurrence of condition will improve Outcome: Progressing Goal: Identification of resources available to assist in meeting health care needs will improve Outcome: Progressing   Problem: Physical Regulation: Goal: Complications related to the disease process, condition or treatment will be avoided or minimized Outcome: Progressing   Problem: Safety: Goal: Ability to remain free from injury will improve Outcome: Progressing

## 2020-07-28 NOTE — Progress Notes (Signed)
Floor coverage overnight event  Hemoglobin down to 6.8 on labs this morning.  I spoke to [name] and recommended blood transfusion (1 unit PRBCs) given hemoglobin is less than 7.  He stated he needs more time to think about it.  -Please readdress in a.m.

## 2020-07-28 NOTE — Progress Notes (Signed)
Pt spoke with Dr. Marlowe Sax about giving consent for PRBC and per Dr. Marlowe Sax "pt stated that he wanted to think about receiving PRBC". This RN will relay this to the oncoming RN to communicated this to the morning MD to reassess pt's desire for PRBC.

## 2020-07-29 DIAGNOSIS — L8995 Pressure ulcer of unspecified site, unstageable: Secondary | ICD-10-CM

## 2020-07-29 LAB — COMPREHENSIVE METABOLIC PANEL
ALT: 110 U/L — ABNORMAL HIGH (ref 0–44)
AST: 252 U/L — ABNORMAL HIGH (ref 15–41)
Albumin: 1.8 g/dL — ABNORMAL LOW (ref 3.5–5.0)
Alkaline Phosphatase: 197 U/L — ABNORMAL HIGH (ref 38–126)
Anion gap: 9 (ref 5–15)
BUN: 23 mg/dL — ABNORMAL HIGH (ref 6–20)
CO2: 18 mmol/L — ABNORMAL LOW (ref 22–32)
Calcium: 8.3 mg/dL — ABNORMAL LOW (ref 8.9–10.3)
Chloride: 117 mmol/L — ABNORMAL HIGH (ref 98–111)
Creatinine, Ser: 0.86 mg/dL (ref 0.61–1.24)
GFR, Estimated: >60 mL/min (ref >60)
Glucose, Bld: 116 mg/dL — ABNORMAL HIGH (ref 70–99)
Potassium: 3.6 mmol/L (ref 3.5–5.1)
Sodium: 144 mmol/L (ref 135–145)
Total Bilirubin: 28.3 mg/dL (ref 0.3–1.2)
Total Protein: 5.1 g/dL — ABNORMAL LOW (ref 6.5–8.1)

## 2020-07-29 LAB — BPAM RBC
Blood Product Expiration Date: 202201042359
ISSUE DATE / TIME: 202112111009
Unit Type and Rh: 5100

## 2020-07-29 LAB — TYPE AND SCREEN
ABO/RH(D): O POS
Antibody Screen: NEGATIVE
Unit division: 0

## 2020-07-29 LAB — CBC
HCT: 24.7 % — ABNORMAL LOW (ref 39.0–52.0)
Hemoglobin: 7.8 g/dL — ABNORMAL LOW (ref 13.0–17.0)
MCH: 31.6 pg (ref 26.0–34.0)
MCHC: 31.6 g/dL (ref 30.0–36.0)
MCV: 100 fL (ref 80.0–100.0)
Platelets: 135 10*3/uL — ABNORMAL LOW (ref 150–400)
RBC: 2.47 MIL/uL — ABNORMAL LOW (ref 4.22–5.81)
RDW: 29.7 % — ABNORMAL HIGH (ref 11.5–15.5)
WBC: 18.7 10*3/uL — ABNORMAL HIGH (ref 4.0–10.5)
nRBC: 0 % (ref 0.0–0.2)

## 2020-07-29 LAB — MAGNESIUM: Magnesium: 2.2 mg/dL (ref 1.7–2.4)

## 2020-07-29 MED ORDER — POTASSIUM CHLORIDE CRYS ER 20 MEQ PO TBCR
40.0000 meq | EXTENDED_RELEASE_TABLET | Freq: Once | ORAL | Status: AC
  Administered 2020-07-29: 40 meq via ORAL
  Filled 2020-07-29: qty 2

## 2020-07-29 NOTE — Progress Notes (Signed)
PROGRESS NOTE    Elise Gladden  MIW:[id number] DOB: [birth year] DOA: 07/16/2020 PCP: Nicolette Bang, DO   Brief Narrative:  49 year old with history of alcohol use, hepatic steatosis, HTN, GERD, schizoaffective disorder, found down at home for concerns of seizures who was intubated in the ER for airway protection.  Initially noted to have transaminitis, elevated bilirubin, lactic acidosis and leukocytosis.  EKG showed prolonged QT as well.  Ammonia was 207 as well.  GI was consulted.  Patient was initially extubated on 12/2 but due to worsening of his symptoms from aspiration he was reintubated on 12/3.  Then extubated again on 12/7.  Received multiple doses of antibiotics-vancomycin, Flagyl, cefepime, cefazolin.  Antibiotics were stopped.  Prednisone was continued.  Due to slight drop in hemoglobin without any overt signs of bleeding he received 1 unit PRBC.  No obvious evidence of bleeding.   Assessment & Plan:   Active Problems:   Encephalopathy, hepatic (HCC)   Pressure injury of skin   Acute respiratory failure (HCC)   Septic shock (HCC)   Alcoholic hepatitis with ascites  Decompensated alcohol liver cirrhosis with portal hypertension hepatic encephalopathy and transaminitis -Right upper quadrant ultrasound small volume of ascites with diminished velocities -Continue rifaximin.  Lactulose-follow mentation adjust as needed.  Need to have 3-4 bowel movements daily -Prednisone 40 mg daily for 4 weeks.  It can be stopped if needed -Following clinically and his lab values  Alcohol abuse -UDS also positive for benzos and THC.  EEG negative for seizure -Alcohol withdrawal protocol -Thiamine, folic acid, multivitamin  Leukocytosis, improving -Improving, due to steroid use  Hypernatremia, improving -Improved with D5 water.  On hold for now but okay to restart if necessary  Essential hypertension -Not on any meds due to borderline low blood pressure.  Currently on  midodrine 10 mg 3 times daily  Schizoaffective disorder -On Seroquel and Zoloft-resumed.  QTC resolved  Acute respiratory failure with hypoxia requiring intubation -Multifactorial in nature including aspiration pneumonia, concerns of seizure and encephalopathy.  Extubated 12/2 re intubated 12/2.  Eventually reextubated on 12/7.  Completed antibiotic course  Prolonged QTC -Now resolved  Macrocytic anemia, hemoglobin-6.8 -TSH B12 - normal.  No obvious evidence of bleeding.  Closely monitor hemoglobin, if drops below 7.0, transfuse accordingly -Folate-normal  PT-recommended supervision-CIR Speech and swallow-dysphagia 3 diet  DVT prophylaxis: Subcutaneous heparin Code Status: Full code Family Communication: Spoke extensively with patient's sister on 07/28/2020 over the phone  Status is: Inpatient  Remains inpatient appropriate because:Inpatient level of care appropriate due to severity of illness   Dispo: The patient is from: Home              Anticipated d/c is to: CIR              Anticipated d/c date is: 1 day              Patient currently is not medically stable to d/c.  Closely monitoring his hemoglobin level and multiple other electrolytes such as sodium, chloride, LFTs.  Hopefully transition to CIR in next 1-2 days       Body mass index is 30.05 kg/m.  Pressure Injury 07/16/20 Buttocks Medial Stage 1 -  Intact skin with non-blanchable redness of a localized area usually over a bony prominence. (Active)  07/16/20 2000  Location: Buttocks  Location Orientation: Medial  Staging: Stage 1 -  Intact skin with non-blanchable redness of a localized area usually over a bony prominence.  Wound Description (Comments):  Present on Admission: Yes       Subjective: Seen and examined at bedside attempting to get his breakfast.  Tells me he medically feels much better.  He wants his potassium tablets to be crossed but I explained to him that it cannot be crushed and he has  to try his best to swallow his tablets.  He understands this.   Examination:  Constitutional: Not in acute distress Respiratory: Clear to auscultation bilaterally Cardiovascular: Normal sinus rhythm, no rubs Abdomen: Mild abdominal distention with fluid wave shift Musculoskeletal: No edema noted Skin: Scleral icterus and Neurologic: CN 2-12 grossly intact.  And nonfocal Psychiatric: Normal judgment and insight. Alert and oriented x 3. Normal mood.   Objective: Vitals:   07/28/20 1536 07/28/20 2012 07/29/20 0026 07/29/20 0442  BP: 120/72 116/65 120/75 109/69  Pulse: 80 72 85 74  Resp: 19 18 19 18   Temp: 97.8 F (36.6 C) (!) 97.5 F (36.4 C) (!) 97.4 F (36.3 C)   TempSrc: Oral Oral Oral   SpO2: 97% 100% 97% 96%  Weight:      Height:        Intake/Output Summary (Last 24 hours) at 07/29/2020 0731 Last data filed at 07/28/2020 2235 Gross per 24 hour  Intake 1170 ml  Output 1250 ml  Net -80 ml   Filed Weights   07/24/20 0500 07/25/20 0500 07/28/20 0502  Weight: 100.5 kg 100.5 kg 105.6 kg     Data Reviewed:   CBC: Recent Labs  Lab 07/24/20 0540 07/25/20 0521 07/26/20 0933 07/27/20 0413 07/28/20 0443 07/29/20 0123  WBC 22.0* 28.5*  --  23.4* 19.3* 18.7*  HGB 7.6* 8.2*  --  7.1* 6.8* 7.8*  HCT 24.3* 26.2* 22.8* 22.7* 20.8* 24.7*  MCV 103.8* 104.8*  --  103.7* 103.5* 100.0  PLT 94* 134*  --  134* 143* 299*   Basic Metabolic Panel: Recent Labs  Lab 07/23/20 0412 07/23/20 1220 07/24/20 0540 07/24/20 0808 07/25/20 0521 07/26/20 0630 07/27/20 0413 07/28/20 0443 07/29/20 0123  NA  --    < >  --    < > 157* 152* 147* 143 144  K  --    < >  --    < > 3.4* 3.2* 3.4* 3.5 3.6  CL  --    < >  --    < > 123* 120* 118* 116* 117*  CO2  --    < >  --    < > 21* 21* 20* 19* 18*  GLUCOSE  --    < >  --    < > 115* 137* 128* 124* 116*  BUN  --    < >  --    < > 45* 38* 29* 25* 23*  CREATININE  --    < >  --    < > 0.94 0.97 0.84 0.89 0.86  CALCIUM  --    < >  --     < > 8.8* 8.7* 8.4* 8.3* 8.3*  MG  --   --   --   --   --   --  2.2 2.2 2.2  PHOS 1.9*  --  4.1  --  4.0  --   --   --   --    < > = values in this interval not displayed.   GFR: Estimated Creatinine Clearance: 133.4 mL/min (by C-G formula based on SCr of 0.86 mg/dL). Liver Function Tests: Recent Labs  Lab 07/24/20 [phone] 07/25/20 8341  07/27/20 0413 07/28/20 0443 07/29/20 0123  AST 123* 164* 187* 208* 252*  ALT 31 37 58* 77* 110*  ALKPHOS 183* 218* 213* 199* 197*  BILITOT 23.9* 24.6* 25.7* 26.8* 28.3*  PROT 5.4* 5.7* 5.3* 5.0* 5.1*  ALBUMIN 2.1* 2.3* 1.9* 1.9* 1.8*   No results for input(s): LIPASE, AMYLASE in the last 168 hours. No results for input(s): AMMONIA in the last 168 hours. Coagulation Profile: No results for input(s): INR, PROTIME in the last 168 hours. Cardiac Enzymes: No results for input(s): CKTOTAL, CKMB, CKMBINDEX, TROPONINI in the last 168 hours. BNP (last 3 results) No results for input(s): PROBNP in the last 8760 hours. HbA1C: No results for input(s): HGBA1C in the last 72 hours. CBG: Recent Labs  Lab 07/26/20 0353 07/26/20 0801 07/26/20 1206 07/26/20 1510 07/27/20 0250  GLUCAP 138* 135* 180* 158* 123*   Lipid Profile: No results for input(s): CHOL, HDL, LDLCALC, TRIG, CHOLHDL, LDLDIRECT in the last 72 hours. Thyroid Function Tests: Recent Labs    07/26/20 0933  TSH 3.266   Anemia Panel: Recent Labs    07/26/20 0933  VITAMINB12 1,958*   Sepsis Labs: No results for input(s): PROCALCITON, LATICACIDVEN in the last 168 hours.  Recent Results (from the past 240 hour(s))  Culture, respiratory (non-expectorated)     Status: None   Collection Time: 07/21/20  1:32 PM   Specimen: Tracheal Aspirate; Respiratory  Result Value Ref Range Status   Specimen Description TRACHEAL ASPIRATE  Final   Special Requests NONE  Final   Gram Stain   Final    MODERATE WBC PRESENT, PREDOMINANTLY PMN NO ORGANISMS SEEN    Culture   Final    NO GROWTH 2  DAYS Performed at [facility], [address] [place], [place] [address]    Report Status 07/23/2020 FINAL  Final         Radiology Studies: No results found.      Scheduled Meds:  vitamin C  1,000 mg Oral Daily   chlorhexidine  15 mL Mouth Rinse BID   Chlorhexidine Gluconate Cloth  6 each Topical Daily   cholecalciferol  2,000 Units Oral Daily   feeding supplement  237 mL Oral TID BM   folic acid  1 mg Oral Daily   heparin injection (subcutaneous)  5,000 Units Subcutaneous Q8H   lactulose  20 g Oral TID   mouth rinse  15 mL Mouth Rinse q12n4p   midodrine  10 mg Oral TID WC   multivitamin with minerals  1 tablet Oral Daily   pantoprazole  40 mg Oral BID   potassium chloride  40 mEq Oral Once   prednisoLONE  40 mg Oral Daily   QUEtiapine  50 mg Oral QHS   rifaximin  550 mg Oral BID   sertraline  25 mg Oral Daily   sodium chloride flush  10-40 mL Intracatheter Q12H   thiamine  100 mg Oral Daily   Continuous Infusions:  sodium chloride Stopped (07/17/20 1958)     LOS: 13 days   Time spent= 35 mins    Morty Ortwein Arsenio Loader, MD [facility]  If 7PM-7AM, please contact night-coverage  07/29/2020, 7:31 AM

## 2020-07-29 NOTE — Plan of Care (Signed)
  Problem: Clinical Measurements: Goal: Ability to maintain clinical measurements within normal limits will improve Outcome: Progressing   Problem: Health Behavior/Discharge Planning: Goal: Ability to manage health-related needs will improve Outcome: Progressing   Problem: Education: Goal: Knowledge of General Education information will improve Description Including pain rating scale, medication(s)/side effects and non-pharmacologic comfort measures Outcome: Progressing   

## 2020-07-29 NOTE — Progress Notes (Signed)
Pt with intermittent confusion trying to get out of bed to bathroom  Frequently reoriented and redirected.stated want PT reminded pt will come this am to help him ambulate.

## 2020-07-29 NOTE — Progress Notes (Signed)
  Speech Language Pathology Treatment: Dysphagia  Patient Details Name: [name] MRN: [id number] DOB: 07/14/1971 Today's Date: 07/29/2020 Time: 0938-1829 SLP Time Calculation (min) (ACUTE ONLY): 16 min  Assessment / Plan / Recommendation Clinical Impression  Pt was seen for dysphagia treatment. Nursing reported that the pt has been refusing thickened liquids and seems to be tolerating thin Gatorade. RN was advised of pt's impaired laryngeal sensation with inconsistent episodes of silent aspiration. Pt reported that he has been drinking thin Gatorade and using the breath hold technique. Pt was inconsistent with timing of breath hold with some occurring before and others after the swallow. Pt exhibited coughing with 1/5 boluses of thin liquids via cup and tolerated 2 boluses of thin liquids via straw without overt s/sx of aspiration. However, silent aspiration cannot be ruled out at baseline and his presentation may be suggestive of the inconsistent laryngeal sensation noted on the last modified barium swallow study. It is recommended that his current diet be continued with allowance of thin water between meals following oral care. SLP will follow for potential diet advancement to thin liquids or repeat instrumental assessment.    HPI HPI: 49 y.o. male admitted with AMS with encephalopathy and alcholic hepatitis. Intubated in ED on 11/29-12/2, reintubated 12/2-12/7.  PMHx: alcoholism, HTN, GERD, schizoaffective disorder, PTSD. MBS 12/9: mild pharyngeal dysphagia with trace inconsistent silent aspiration from premature spill and mistimed laryngeal closure. Thin liquids were penetrated to cords with cup and aspirated with straw. Sensation was not consistent and he demonstrated both cough and no cough in response to cord penetration and aspiration. New order received 12/12 with comment "Pt feels that he can swallow thin liquids now and had drank some Gatorade upon assessment and tolerated well."       SLP Plan  Continue with current plan of care       Recommendations  Diet recommendations: Dysphagia 3 (mechanical soft);Nectar-thick liquid Liquids provided via: Cup;No straw Medication Administration: Whole meds with puree Supervision: Patient able to self feed Compensations: Slow rate;Small sips/bites;Clear throat intermittently Postural Changes and/or Swallow Maneuvers: Seated upright 90 degrees                Oral Care Recommendations: Oral care BID;Oral care prior to ice chip/H20 Follow up Recommendations: Inpatient Rehab SLP Visit Diagnosis: Dysphagia, pharyngeal phase (R13.13) Plan: Continue with current plan of care       Elison Worrel I. Hardin Negus, [place], [place] Office number [phone] Pager [place] 07/29/2020, 4:34 PM

## 2020-07-30 DIAGNOSIS — Z515 Encounter for palliative care: Secondary | ICD-10-CM

## 2020-07-30 DIAGNOSIS — Z7189 Other specified counseling: Secondary | ICD-10-CM

## 2020-07-30 DIAGNOSIS — K652 Spontaneous bacterial peritonitis: Secondary | ICD-10-CM

## 2020-07-30 LAB — COMPREHENSIVE METABOLIC PANEL
ALT: 149 U/L — ABNORMAL HIGH (ref 0–44)
AST: 292 U/L — ABNORMAL HIGH (ref 15–41)
Albumin: 1.9 g/dL — ABNORMAL LOW (ref 3.5–5.0)
Alkaline Phosphatase: 231 U/L — ABNORMAL HIGH (ref 38–126)
Anion gap: 10 (ref 5–15)
BUN: 21 mg/dL — ABNORMAL HIGH (ref 6–20)
CO2: 18 mmol/L — ABNORMAL LOW (ref 22–32)
Calcium: 8.5 mg/dL — ABNORMAL LOW (ref 8.9–10.3)
Chloride: 113 mmol/L — ABNORMAL HIGH (ref 98–111)
Creatinine, Ser: 0.79 mg/dL (ref 0.61–1.24)
GFR, Estimated: >60 mL/min (ref >60)
Glucose, Bld: 145 mg/dL — ABNORMAL HIGH (ref 70–99)
Potassium: 3.6 mmol/L (ref 3.5–5.1)
Sodium: 141 mmol/L (ref 135–145)
Total Bilirubin: 32.6 mg/dL (ref 0.3–1.2)
Total Protein: 5.5 g/dL — ABNORMAL LOW (ref 6.5–8.1)

## 2020-07-30 LAB — CBC
HCT: 27 % — ABNORMAL LOW (ref 39.0–52.0)
Hemoglobin: 8.4 g/dL — ABNORMAL LOW (ref 13.0–17.0)
MCH: 31.9 pg (ref 26.0–34.0)
MCHC: 31.1 g/dL (ref 30.0–36.0)
MCV: 102.7 fL — ABNORMAL HIGH (ref 80.0–100.0)
Platelets: 157 10*3/uL (ref 150–400)
RBC: 2.63 MIL/uL — ABNORMAL LOW (ref 4.22–5.81)
RDW: 29.4 % — ABNORMAL HIGH (ref 11.5–15.5)
WBC: 19.1 10*3/uL — ABNORMAL HIGH (ref 4.0–10.5)
nRBC: 0 % (ref 0.0–0.2)

## 2020-07-30 LAB — MAGNESIUM: Magnesium: 2.2 mg/dL (ref 1.7–2.4)

## 2020-07-30 MED ORDER — POTASSIUM CHLORIDE CRYS ER 20 MEQ PO TBCR
40.0000 meq | EXTENDED_RELEASE_TABLET | Freq: Once | ORAL | Status: AC
  Administered 2020-07-30: 40 meq via ORAL
  Filled 2020-07-30: qty 2

## 2020-07-30 NOTE — Progress Notes (Signed)
PT Cancellation Note  Patient Details Name: [name] MRN: [id number] DOB: 05-26-1971   Cancelled Treatment:    Reason Eval/Treat Not Completed: Patient declined, no reason specified;Fatigue/lethargy limiting ability to participate. Acute PT to follow up another date.   Willow Ora, PTA, CLT Acute Rehab Services [phone] 07/30/20, 12:15 PM   Willow Ora 07/30/2020, 12:14 PM

## 2020-07-30 NOTE — Plan of Care (Signed)
  Problem: Clinical Measurements: Goal: Will remain free from infection Outcome: Progressing   Problem: Clinical Measurements: Goal: Ability to maintain clinical measurements within normal limits will improve Outcome: Progressing   Problem: Health Behavior/Discharge Planning: Goal: Ability to manage health-related needs will improve Outcome: Progressing   Problem: Education: Goal: Knowledge of General Education information will improve Description: Including pain rating scale, medication(s)/side effects and non-pharmacologic comfort measures Outcome: Progressing   

## 2020-07-30 NOTE — Progress Notes (Addendum)
PROGRESS NOTE    [name]  AST:[id number] DOB: 1970/08/19 DOA: 07/16/2020 PCP: Nicolette Bang, DO   Brief Narrative:  49 year old with history of alcohol use, hepatic steatosis, HTN, GERD, schizoaffective disorder, found down at home for concerns of seizures who was intubated in the ER for airway protection.  Initially noted to have transaminitis, elevated bilirubin, lactic acidosis and leukocytosis.  EKG showed prolonged QT as well.  Ammonia was 207 as well.  GI was consulted.  Patient was initially extubated on 12/2 but due to worsening of his symptoms from aspiration he was reintubated on 12/3.  Then extubated again on 12/7.  Received multiple doses of antibiotics-vancomycin, Flagyl, cefepime, cefazolin.  Antibiotics were stopped.  Prednisone was continued.  Due to slight drop in hemoglobin without any overt signs of bleeding he received 1 unit PRBC.  No obvious evidence of bleeding.  T bili slowly trending upwards with limited option for intervention.   Assessment & Plan:   Active Problems:   Encephalopathy, hepatic (HCC)   Pressure injury of skin   Acute respiratory failure (HCC)   Septic shock (HCC)   Alcoholic hepatitis with ascites  Decompensated alcohol liver cirrhosis with portal hypertension hepatic encephalopathy and transaminitis Hypoalbuminemia -Right upper quadrant ultrasound small volume of ascites with diminished velocities -Continue rifaximin.  Lactulose-follow mentation adjust as needed.  Need to have 3-4 bowel movements daily -Stop prednisone, ordered Limited US to reeval Ascites. Discussed with GI.  -Total bilirubin trending upwards -Encourage nutrition  Alcohol abuse -UDS also positive for benzos and THC.  EEG negative for seizure -Alcohol withdrawal protocol -Thiamine, folic acid, multivitamin  Leukocytosis -Secondary to steroid use or evidence of infection  Hypernatremia, improving -Improved with D5 water.  Essential hypertension -Not  on any meds due to borderline low blood pressure.  Currently on midodrine 10 mg 3 times daily  Schizoaffective disorder -On Seroquel and Zoloft-resumed.  QTC resolved  Acute respiratory failure with hypoxia requiring intubation, resolved -Multifactorial in nature including aspiration pneumonia, concerns of seizure and encephalopathy.  Extubated 12/2 re intubated 12/2.  Eventually reextubated on 12/7.  Completed antibiotic course  Prolonged QTC -Now resolved  Macrocytic anemia, hemoglobin-6.8 -TSH B12 - normal.  No obvious evidence of bleeding.  Closely monitor hemoglobin, if drops below 7.0, transfuse accordingly -Folate-normal  PT-recommended supervision-CIR Speech and swallow-dysphagia 3 diet  Overall patient has very poor prognosis because of advanced liver failure. Will consult palliative care to help establish some goals of care  DVT prophylaxis: Subcutaneous heparin Code Status: Full code Family Communication: Mother and sister have been extensively updated by me regarding his care  Status is: Inpatient  Remains inpatient appropriate because:Inpatient level of care appropriate due to severity of illness   Dispo: The patient is from: Home              Anticipated d/c is to: CIR              Anticipated d/c date is: 1 day              Patient currently is overall medically stable for bilirubin continues to trend upwards along with LFTs    Body mass index is 30.05 kg/m.  Pressure Injury 07/16/20 Buttocks Medial Stage 1 -  Intact skin with non-blanchable redness of a localized area usually over a bony prominence. (Active)  07/16/20 2000  Location: Buttocks  Location Orientation: Medial  Staging: Stage 1 -  Intact skin with non-blanchable redness of a localized area usually over a bony  prominence.  Wound Description (Comments):   Present on Admission: Yes       Subjective: Feels tired no other complaints.  Feels like he still cannot  present   Examination: Constitutional: Not in acute distress Respiratory: Clear to auscultation bilaterally Cardiovascular: Normal sinus rhythm, no rubs Abdomen: Abdomen is distended with fluid wave shift Musculoskeletal: No edema noted Skin: Scleral icterus and jaundiced Neurologic: CN 2-12 grossly intact.  And nonfocal Psychiatric: Normal judgment and insight. Alert and oriented x 3. Normal mood.  Objective: Vitals:   07/29/20 1606 07/29/20 2000 07/29/20 2346 07/30/20 0444  BP: 117/71 119/78 136/85 123/81  Pulse: 89 79 93 88  Resp: 18  19 19   Temp: (!) 97.4 F (36.3 C) 97.6 F (36.4 C) 97.6 F (36.4 C) 97.6 F (36.4 C)  TempSrc: Oral Axillary Oral Oral  SpO2: 100% 98% 100% 100%  Weight:      Height:        Intake/Output Summary (Last 24 hours) at 07/30/2020 0733 Last data filed at 07/29/2020 2000 Gross per 24 hour  Intake 180 ml  Output 700 ml  Net -520 ml   Filed Weights   07/25/20 0500 07/28/20 0502 07/29/20 0500  Weight: 100.5 kg 105.6 kg 106.8 kg     Data Reviewed:   CBC: Recent Labs  Lab 07/25/20 0521 07/26/20 0933 07/27/20 0413 07/28/20 0443 07/29/20 0123 07/30/20 0457  WBC 28.5*  --  23.4* 19.3* 18.7* 19.1*  HGB 8.2*  --  7.1* 6.8* 7.8* 8.4*  HCT 26.2* 22.8* 22.7* 20.8* 24.7* 27.0*  MCV 104.8*  --  103.7* 103.5* 100.0 102.7*  PLT 134*  --  134* 143* 135* 884   Basic Metabolic Panel: Recent Labs  Lab 07/24/20 0540 07/24/20 0808 07/25/20 0521 07/26/20 0630 07/27/20 0413 07/28/20 0443 07/29/20 0123 07/30/20 0457  NA  --    < > 157* 152* 147* 143 144 141  K  --    < > 3.4* 3.2* 3.4* 3.5 3.6 3.6  CL  --    < > 123* 120* 118* 116* 117* 113*  CO2  --    < > 21* 21* 20* 19* 18* 18*  GLUCOSE  --    < > 115* 137* 128* 124* 116* 145*  BUN  --    < > 45* 38* 29* 25* 23* 21*  CREATININE  --    < > 0.94 0.97 0.84 0.89 0.86 0.79  CALCIUM  --    < > 8.8* 8.7* 8.4* 8.3* 8.3* 8.5*  MG  --   --   --   --  2.2 2.2 2.2 2.2  PHOS 4.1  --  4.0  --    --   --   --   --    < > = values in this interval not displayed.   GFR: Estimated Creatinine Clearance: 144.2 mL/min (by C-G formula based on SCr of 0.79 mg/dL). Liver Function Tests: Recent Labs  Lab 07/25/20 0521 07/27/20 0413 07/28/20 0443 07/29/20 0123 07/30/20 0457  AST 164* 187* 208* 252* 292*  ALT 37 58* 77* 110* 149*  ALKPHOS 218* 213* 199* 197* 231*  BILITOT 24.6* 25.7* 26.8* 28.3* 32.6*  PROT 5.7* 5.3* 5.0* 5.1* 5.5*  ALBUMIN 2.3* 1.9* 1.9* 1.8* 1.9*   No results for input(s): LIPASE, AMYLASE in the last 168 hours. No results for input(s): AMMONIA in the last 168 hours. Coagulation Profile: No results for input(s): INR, PROTIME in the last 168 hours. Cardiac Enzymes: No results  for input(s): CKTOTAL, CKMB, CKMBINDEX, TROPONINI in the last 168 hours. BNP (last 3 results) No results for input(s): PROBNP in the last 8760 hours. HbA1C: No results for input(s): HGBA1C in the last 72 hours. CBG: Recent Labs  Lab 07/26/20 0353 07/26/20 0801 07/26/20 1206 07/26/20 1510 07/27/20 0250  GLUCAP 138* 135* 180* 158* 123*   Lipid Profile: No results for input(s): CHOL, HDL, LDLCALC, TRIG, CHOLHDL, LDLDIRECT in the last 72 hours. Thyroid Function Tests: No results for input(s): TSH, T4TOTAL, FREET4, T3FREE, THYROIDAB in the last 72 hours. Anemia Panel: No results for input(s): VITAMINB12, FOLATE, FERRITIN, TIBC, IRON, RETICCTPCT in the last 72 hours. Sepsis Labs: No results for input(s): PROCALCITON, LATICACIDVEN in the last 168 hours.  Recent Results (from the past 240 hour(s))  Culture, respiratory (non-expectorated)     Status: None   Collection Time: 07/21/20  1:32 PM   Specimen: Tracheal Aspirate; Respiratory  Result Value Ref Range Status   Specimen Description TRACHEAL ASPIRATE  Final   Special Requests NONE  Final   Gram Stain   Final    MODERATE WBC PRESENT, PREDOMINANTLY PMN NO ORGANISMS SEEN    Culture   Final    NO GROWTH 2 DAYS Performed at [facility], [address], [place], [place] [address]    Report Status 07/23/2020 FINAL  Final         Radiology Studies: No results found.      Scheduled Meds: . vitamin C  1,000 mg Oral Daily  . chlorhexidine  15 mL Mouth Rinse BID  . Chlorhexidine Gluconate Cloth  6 each Topical Daily  . cholecalciferol  2,000 Units Oral Daily  . feeding supplement  237 mL Oral TID BM  . folic acid  1 mg Oral Daily  . heparin injection (subcutaneous)  5,000 Units Subcutaneous Q8H  . lactulose  20 g Oral TID  . mouth rinse  15 mL Mouth Rinse q12n4p  . midodrine  10 mg Oral TID WC  . multivitamin with minerals  1 tablet Oral Daily  . pantoprazole  40 mg Oral BID  . potassium chloride  40 mEq Oral Once  . prednisoLONE  40 mg Oral Daily  . QUEtiapine  50 mg Oral QHS  . rifaximin  550 mg Oral BID  . sertraline  25 mg Oral Daily  . sodium chloride flush  10-40 mL Intracatheter Q12H  . thiamine  100 mg Oral Daily   Continuous Infusions: . sodium chloride Stopped (07/17/20 1958)     LOS: 14 days   Time spent= 35 mins    [name] Arsenio Loader, MD [facility]  If 7PM-7AM, please contact night-coverage  07/30/2020, 7:33 AM

## 2020-07-30 NOTE — Plan of Care (Signed)
  Problem: Education: Goal: Knowledge of General Education information will improve Description: Including pain rating scale, medication(s)/side effects and non-pharmacologic comfort measures Outcome: Progressing   Problem: Coping: Goal: Level of anxiety will decrease Outcome: Progressing   Problem: Safety: Goal: Ability to remain free from injury will improve Outcome: Progressing   Problem: Skin Integrity: Goal: Risk for impaired skin integrity will decrease Outcome: Progressing   Problem: Medication: Goal: Risk for medication side effects will decrease Outcome: Progressing

## 2020-07-30 NOTE — Progress Notes (Signed)
Inpatient Rehab Admissions Coordinator:   Met with patient at bedside to discuss CIR.  Insurance auth pending, but pt has been refusing therapy.  Encouraged pt to participate with therapy to demonstrate tolerance for 3 hrs/day of therapy. I also left voicemail for sister to provide an update.    Shann Medal, PT, DPT Admissions Coordinator [phone] 07/30/20  4:13 PM

## 2020-07-30 NOTE — Consult Note (Signed)
Consultation Note Date: 07/30/2020   Patient Name: [name]  DOB: 13-Feb-1971  MRN: [id number]  Age / Sex: 49 y.o., male  PCP: Nicolette Bang, DO Referring Physician: Damita Lack, MD  Reason for Consultation: Establishing goals of care and Psychosocial/spiritual support  HPI/Patient Profile: 49 y.o. male   admitted on 07/16/2020 with  past medical history notable for EtOH use disorder, hepatic steatosis, essential hypertension, GERD, schizoaffective disorder, PTSD,  presents after found down at home for concern for seizures, patient intubated in ED for for airway protection due to depressed consciousness.    In ED Elevated AST 207, ALT 68, Bili 21, Cr. 1.68. K 2.6, Mg 1.3,  albumin 1.8. Lactate of 9.6.  CBC with WBC of 14.2, hbg 11.5 improved from last admission of 8, plt 156. INR 1.6. Ammonia of 207. EKG with QT prolongation.  Patient remains chronically ill with alcohol liver cirrhosis with portal hypertension. He is high risk for decompensation secondary to multiple comorbidities.  Patient and family face treatment option decisions, advanced directive decisions and anticipatory care needs.   Clinical Assessment and Goals of Care:  This NP Wadie Lessen reviewed medical records, received report from team, assessed the patient and then meet at the patient's bedside  to discuss diagnosis, prognosis, GOC, EOL wishes disposition and options.  I then spoke to patient's sister by phone.    Concept of Palliative Care was introduced as specialized medical care for people and their families living with serious illness.  If focuses on providing relief from the symptoms and stress of a serious illness.  The goal is to improve quality of life for both the patient and the family.  Values and goals of care important to patient and family were attempted to be elicited.  Created space and opportunity  for patient  and family to explore thoughts and feelings regarding current medical situation. Patient and his sister verbalize hope for admission to inpatient rehabilitation. Patient himself tells me that once he is stable he will discharge home to live with his sister, however his sister tells me that family is looking at other options, transition to [place] with his mother or securing of the living space for both patient and his mother.  All transition of care plans are dependent on outcomes over the next few weeks. For now patient tells me he is open to all offered and available medical interventions to prolong life. He hopes to maintain his sobriety.  A  discussion was had today regarding advanced directives.  Concepts specific to code status, artifical feeding and hydration, continued IV antibiotics and rehospitalization was had.  The difference between a aggressive medical intervention path  and a palliative comfort care path for this patient at this time was had.    Encourage patient and his family to take the opportunity to secure documents with the support of spiritual care department while here in the hospital.     Questions and concerns addressed.  Patient  encouraged to call with questions or concerns.  PMT will continue to support holistically.          No documented healthcare power of attorney or advanced care planning documents at this time.  Encourage patient to secure documents with the assistance of spiritual care department.  Declined at this time.     SUMMARY OF RECOMMENDATIONS    Code Status/Advance Care Planning:  Full code   Palliative Prophylaxis:   Aspiration, Bowel Regimen, Delirium Protocol, Frequent Pain Assessment and Oral Care  Additional Recommendations (Limitations, Scope, Preferences):  Full Scope Treatment  Psycho-social/Spiritual:   Desire for further Chaplaincy support:yes   Prognosis:   Unable to determine   Will be dependent on  desire for life prolonging measures and compliance with treatment plan  Discharge Planning: Hopeful for inpatient rehab  To Be Determined      Primary Diagnoses: Present on Admission: **None**   I have reviewed the medical record, interviewed the patient and family, and examined the patient. The following aspects are pertinent.  Past Medical History:  Diagnosis Date  . Alcohol abuse   . Anxiety    at age 80  . Depression    at age 65  . GERD (gastroesophageal reflux disease)   . Hypertension   . Psychosis ([place])   . PTSD (post-traumatic stress disorder)   . Schizoaffective disorder   . Seizure disorder ([place])    related to etoh seizure  . Symptomatic anemia    Social History   Socioeconomic History  . Marital status: Divorced    Spouse name: Not on file  . Number of children: Not on file  . Years of education: Not on file  . Highest education level: Not on file  Occupational History  . Not on file  Tobacco Use  . Smoking status: Current Every Day Smoker    Packs/day: 1.50    Years: 30.00    Pack years: 45.00    Types: Cigarettes  . Smokeless tobacco: Never Used  Vaping Use  . Vaping Use: Every day  Substance and Sexual Activity  . Alcohol use: Yes    Alcohol/week: 0.0 standard drinks    Comment: drinking daily   . Drug use: Yes    Frequency: 1.0 times per week    Types: Marijuana    Comment: THC 2 to 3 times per month. last use 09/24/2015  . Sexual activity: Yes    Birth control/protection: Condom  Other Topics Concern  . Not on file  Social History Narrative  . Not on file   Social Determinants of Health   Financial Resource Strain: Not on file  Food Insecurity: No Food Insecurity  . Worried About Charity fundraiser in the Last Year: Never true  . Ran Out of Food in the Last Year: Never true  Transportation Needs: Unmet Transportation Needs  . Lack of Transportation (Medical): Yes  . Lack of Transportation (Non-Medical): Yes  Physical Activity:  Not on file  Stress: Not on file  Social Connections: Not on file   Family History  Problem Relation Age of Onset  . Alcoholism Mother   . CAD Father   . Depression Brother    Scheduled Meds: . vitamin C  1,000 mg Oral Daily  . chlorhexidine  15 mL Mouth Rinse BID  . Chlorhexidine Gluconate Cloth  6 each Topical Daily  . cholecalciferol  2,000 Units Oral Daily  . feeding supplement  237 mL Oral TID BM  . folic acid  1 mg Oral Daily  . heparin injection (  subcutaneous)  5,000 Units Subcutaneous Q8H  . lactulose  20 g Oral TID  . mouth rinse  15 mL Mouth Rinse q12n4p  . midodrine  10 mg Oral TID WC  . multivitamin with minerals  1 tablet Oral Daily  . pantoprazole  40 mg Oral BID  . prednisoLONE  40 mg Oral Daily  . QUEtiapine  50 mg Oral QHS  . rifaximin  550 mg Oral BID  . sertraline  25 mg Oral Daily  . sodium chloride flush  10-40 mL Intracatheter Q12H  . thiamine  100 mg Oral Daily   Continuous Infusions: . sodium chloride Stopped (07/17/20 1958)   PRN Meds:.sodium chloride, alum & mag hydroxide-simeth, dextromethorphan-guaiFENesin, docusate sodium, ibuprofen, ipratropium-albuterol, polyethylene glycol, Resource ThickenUp Clear, sodium chloride flush Medications Prior to Admission:  Prior to Admission medications   Medication Sig Start Date End Date Taking? Authorizing Provider  allopurinol (ZYLOPRIM) 100 MG tablet Take 2 tablets (200 mg total) by mouth daily. 04/20/20   Nicolette Bang, DO  amLODipine (NORVASC) 10 MG tablet Take 10 mg by mouth daily.    [provider]  loperamide (IMODIUM) 2 MG capsule Take 4 mg by mouth as needed for diarrhea or loose stools.    [provider]  metoprolol tartrate (LOPRESSOR) 25 MG tablet Take 0.5 tablets (12.5 mg total) by mouth 2 (two) times daily. 07/11/20 08/10/20  Nicolette Bang, DO  MILK THISTLE PO Take 1 capsule by mouth daily.    [provider]  Misc. Devices MISC Blood Pressure  monitor. Dx :Hypertension 06/09/19   [name], MD  Multiple Vitamin (MULTIVITAMIN WITH MINERALS) TABS tablet Take 1 tablet by mouth daily. 05/08/20   [place] Eric J, DO  pantoprazole (PROTONIX) 40 MG tablet Take 1 tablet (40 mg total) by mouth daily. 07/11/20 08/10/20  Nicolette Bang, DO  QUEtiapine (SEROQUEL) 50 MG tablet Take 1 tablet (50 mg total) by mouth at bedtime. 06/06/20 07/06/20  Nicolette Bang, DO  sertraline (ZOLOFT) 25 MG tablet Take 1 tablet (25 mg total) by mouth daily. 07/11/20   Nicolette Bang, DO   Allergies  Allergen Reactions  . Wellbutrin [Bupropion] Other (See Comments)    Caused seizures   Review of Systems  Gastrointestinal: Positive for abdominal distention.  Neurological: Positive for weakness.    Physical Exam Constitutional:      Appearance: He is ill-appearing.  Cardiovascular:     Rate and Rhythm: Normal rate.  Abdominal:     General: There is distension.  Skin:    General: Skin is warm and dry.     Coloration: Skin is jaundiced.     Findings: Bruising present.  Neurological:     Mental Status: He is alert.     Motor: Weakness present.     Vital Signs: BP 115/73 (BP Location: Left Arm)   Pulse 85   Temp 97.7 F (36.5 C) (Axillary)   Resp 20   Ht 6' (1.829 m)   Wt 106.8 kg   SpO2 100%   BMI 31.93 kg/m  Pain Scale: 0-10   Pain Score: 4    SpO2: SpO2: 100 % O2 Device:SpO2: 100 % O2 Flow Rate: .O2 Flow Rate (L/min): 1 L/min  IO: Intake/output summary:   Intake/Output Summary (Last 24 hours) at 07/30/2020 1118 Last data filed at 07/30/2020 0530 Gross per 24 hour  Intake 450 ml  Output 1750 ml  Net -1300 ml    LBM: Last BM Date: 07/29/20 Baseline Weight:  Weight: 98.4 kg Most recent weight: Weight: 106.8 kg     Palliative Assessment/Data:   Discussed  With Dr Reesa Chew  Time In: 1100 Time Out: 1215 Time Total: 75 minutes Greater than 50%  of this time was spent counseling and  coordinating care related to the above assessment and plan.  Signed by: Wadie Lessen, NP   Please contact Palliative Medicine Team phone at [phone] for questions and concerns.  For individual provider: See Shea Evans

## 2020-07-31 ENCOUNTER — Inpatient Hospital Stay (HOSPITAL_COMMUNITY): Payer: Medicare Other

## 2020-07-31 LAB — COMPREHENSIVE METABOLIC PANEL
ALT: 173 U/L — ABNORMAL HIGH (ref 0–44)
AST: 257 U/L — ABNORMAL HIGH (ref 15–41)
Albumin: 1.8 g/dL — ABNORMAL LOW (ref 3.5–5.0)
Alkaline Phosphatase: 222 U/L — ABNORMAL HIGH (ref 38–126)
Anion gap: 8 (ref 5–15)
BUN: 20 mg/dL (ref 6–20)
CO2: 18 mmol/L — ABNORMAL LOW (ref 22–32)
Calcium: 8.5 mg/dL — ABNORMAL LOW (ref 8.9–10.3)
Chloride: 114 mmol/L — ABNORMAL HIGH (ref 98–111)
Creatinine, Ser: 0.86 mg/dL (ref 0.61–1.24)
GFR, Estimated: >60 mL/min (ref >60)
Glucose, Bld: 129 mg/dL — ABNORMAL HIGH (ref 70–99)
Potassium: 3.7 mmol/L (ref 3.5–5.1)
Sodium: 140 mmol/L (ref 135–145)
Total Bilirubin: 29.1 mg/dL (ref 0.3–1.2)
Total Protein: 5 g/dL — ABNORMAL LOW (ref 6.5–8.1)

## 2020-07-31 LAB — CBC
HCT: 24.7 % — ABNORMAL LOW (ref 39.0–52.0)
Hemoglobin: 7.8 g/dL — ABNORMAL LOW (ref 13.0–17.0)
MCH: 32.6 pg (ref 26.0–34.0)
MCHC: 31.6 g/dL (ref 30.0–36.0)
MCV: 103.3 fL — ABNORMAL HIGH (ref 80.0–100.0)
Platelets: 151 10*3/uL (ref 150–400)
RBC: 2.39 MIL/uL — ABNORMAL LOW (ref 4.22–5.81)
RDW: 28.7 % — ABNORMAL HIGH (ref 11.5–15.5)
WBC: 16.9 10*3/uL — ABNORMAL HIGH (ref 4.0–10.5)
nRBC: 0 % (ref 0.0–0.2)

## 2020-07-31 LAB — MAGNESIUM: Magnesium: 2.2 mg/dL (ref 1.7–2.4)

## 2020-07-31 MED ORDER — DEXTROSE 5 % IV SOLN
12.5000 mg/kg/h | INTRAVENOUS | Status: AC
  Administered 2020-07-31: 12.5 mg/kg/h via INTRAVENOUS
  Filled 2020-07-31: qty 120

## 2020-07-31 MED ORDER — MAGNESIUM OXIDE 400 (241.3 MG) MG PO TABS
800.0000 mg | ORAL_TABLET | Freq: Once | ORAL | Status: AC
  Administered 2020-07-31: 800 mg via ORAL
  Filled 2020-07-31: qty 2

## 2020-07-31 MED ORDER — POTASSIUM CHLORIDE CRYS ER 20 MEQ PO TBCR
40.0000 meq | EXTENDED_RELEASE_TABLET | Freq: Once | ORAL | Status: AC
  Administered 2020-07-31: 40 meq via ORAL
  Filled 2020-07-31: qty 2

## 2020-07-31 MED ORDER — ACETYLCYSTEINE LOAD VIA INFUSION
150.0000 mg/kg | Freq: Once | INTRAVENOUS | Status: AC
  Administered 2020-07-31: 16020 mg via INTRAVENOUS
  Filled 2020-07-31: qty 401

## 2020-07-31 MED ORDER — DEXTROSE 5 % IV SOLN
6.2500 mg/kg/h | INTRAVENOUS | Status: AC
  Administered 2020-07-31 – 2020-08-02 (×2): 6.25 mg/kg/h via INTRAVENOUS
  Filled 2020-07-31 (×2): qty 120

## 2020-07-31 NOTE — Progress Notes (Signed)
MEDICATION RELATED CONSULT NOTE - FOLLOW UP   Pharmacy Consult for N-acetylcysteine Indication: for non-APAP associated liver failure   Allergies  Allergen Reactions  . Wellbutrin [Bupropion] Other (See Comments)    Caused seizures    Patient Measurements: Height: 6' (182.9 cm) Weight: 106.8 kg (235 lb 7.2 oz) IBW/kg (Calculated) : 77.6 Adjusted Body Weight: 88  Vital Signs: Temp: 97.8 F (36.6 C) (12/14 1157) Temp Source: Axillary (12/14 1157) BP: 115/72 (12/14 1157) Pulse Rate: 76 (12/14 1157) Intake/Output from previous day: 12/13 0701 - 12/14 0700 In: 410 [P.O.:410] Out: -  Intake/Output from this shift: No intake/output data recorded.  Labs: Recent Labs    07/29/20 0123 07/30/20 0457 07/31/20 0445  WBC 18.7* 19.1* 16.9*  HGB 7.8* 8.4* 7.8*  HCT 24.7* 27.0* 24.7*  PLT 135* 157 151  CREATININE 0.86 0.79 0.86  MG 2.2 2.2 2.2  ALBUMIN 1.8* 1.9* 1.8*  PROT 5.1* 5.5* 5.0*  AST 252* 292* 257*  ALT 110* 149* 173*  ALKPHOS 197* 231* 222*  BILITOT 28.3* 32.6* 29.1*   Estimated Creatinine Clearance: 134.1 mL/min (by C-G formula based on SCr of 0.86 mg/dL).   Microbiology: Recent Results (from the past 720 hour(s))  Blood Culture (routine x 2)     Status: None   Collection Time: 07/16/20  3:28 PM   Specimen: BLOOD RIGHT HAND  Result Value Ref Range Status   Specimen Description BLOOD RIGHT HAND  Final   Special Requests   Final    BOTTLES DRAWN AEROBIC AND ANAEROBIC Blood Culture adequate volume   Culture   Final    NO GROWTH 5 DAYS Performed at [facility], [address] [place], [place] [address]    Report Status 07/21/2020 FINAL  Final  Blood Culture (routine x 2)     Status: None   Collection Time: 07/16/20  3:29 PM   Specimen: BLOOD  Result Value Ref Range Status   Specimen Description BLOOD LEFT ANTECUBITAL  Final   Special Requests   Final    BOTTLES DRAWN AEROBIC AND ANAEROBIC Blood Culture adequate volume   Culture   Final    NO  GROWTH 5 DAYS Performed at [facility], [place] [address], [place], [place] [address]    Report Status 07/21/2020 FINAL  Final  Urine culture     Status: Abnormal   Collection Time: 07/16/20  3:41 PM   Specimen: In/Out Cath Urine  Result Value Ref Range Status   Specimen Description IN/OUT CATH URINE  Final   Special Requests   Final    NONE Performed at [facility], [place] [address] [place], [place] [address]    Culture 1,000 COLONIES/mL ESCHERICHIA COLI (A)  Final   Report Status 07/19/2020 FINAL  Final   Organism ID, Bacteria ESCHERICHIA COLI (A)  Final      Susceptibility   Escherichia coli - MIC*    AMPICILLIN >=32 RESISTANT Resistant     CEFAZOLIN <=4 SENSITIVE Sensitive     CEFEPIME <=0.12 SENSITIVE Sensitive     CEFTRIAXONE <=0.25 SENSITIVE Sensitive     CIPROFLOXACIN >=4 RESISTANT Resistant     GENTAMICIN <=1 SENSITIVE Sensitive     IMIPENEM <=0.25 SENSITIVE Sensitive     NITROFURANTOIN <=16 SENSITIVE Sensitive     TRIMETH/SULFA <=20 SENSITIVE Sensitive     AMPICILLIN/SULBACTAM >=32 RESISTANT Resistant     PIP/TAZO <=4 SENSITIVE Sensitive     * 1,000 COLONIES/mL ESCHERICHIA COLI  Resp Panel by RT-PCR (  Flu A&B, Covid) Nasopharyngeal Swab     Status: None   Collection Time: 07/16/20  3:44 PM   Specimen: Nasopharyngeal Swab; Nasopharyngeal(NP) swabs in vial transport medium  Result Value Ref Range Status   SARS Coronavirus 2 by RT PCR NEGATIVE NEGATIVE Final    Comment: (NOTE) SARS-CoV-2 target nucleic acids are NOT DETECTED.  The SARS-CoV-2 RNA is generally detectable in upper respiratory specimens during the acute phase of infection. The lowest concentration of SARS-CoV-2 viral copies this assay can detect is 138 copies/mL. A negative result does not preclude SARS-Cov-2 infection and should not be used as the sole basis for treatment or other patient management decisions. A negative result may occur with  improper specimen collection/handling, submission  of specimen other than nasopharyngeal swab, presence of viral mutation(s) within the areas targeted by this assay, and inadequate number of viral copies(<138 copies/mL). A negative result must be combined with clinical observations, patient history, and epidemiological information. The expected result is Negative.  Fact Sheet for Patients:  EntrepreneurPulse.com.au  Fact Sheet for Healthcare Providers:  IncredibleEmployment.be  This test is no t yet approved or cleared by the [place] FDA and  has been authorized for detection and/or diagnosis of SARS-CoV-2 by FDA under an Emergency Use Authorization (EUA). This EUA will remain  in effect (meaning this test can be used) for the duration of the COVID-19 declaration under Section 564(b)(1) of the Act, 21 U.S.C.section 360bbb-3(b)(1), unless the authorization is terminated  or revoked sooner.       Influenza A by PCR NEGATIVE NEGATIVE Final   Influenza B by PCR NEGATIVE NEGATIVE Final    Comment: (NOTE) The Xpert Xpress SARS-CoV-2/FLU/RSV plus assay is intended as an aid in the diagnosis of influenza from Nasopharyngeal swab specimens and should not be used as a sole basis for treatment. Nasal washings and aspirates are unacceptable for Xpert Xpress SARS-CoV-2/FLU/RSV testing.  Fact Sheet for Patients: EntrepreneurPulse.com.au  Fact Sheet for Healthcare Providers: IncredibleEmployment.be  This test is not yet approved or cleared by the [place] FDA and has been authorized for detection and/or diagnosis of SARS-CoV-2 by FDA under an Emergency Use Authorization (EUA). This EUA will remain in effect (meaning this test can be used) for the duration of the COVID-19 declaration under Section 564(b)(1) of the Act, 21 U.S.C. section 360bbb-3(b)(1), unless the authorization is terminated or revoked.  Performed at [facility], [place] [address],  [place], [place] [address]   MRSA PCR Screening     Status: None   Collection Time: 07/17/20  4:41 AM   Specimen: Nasal Mucosa; Nasopharyngeal  Result Value Ref Range Status   MRSA by PCR NEGATIVE NEGATIVE Final    Comment:        The GeneXpert MRSA Assay (FDA approved for NASAL specimens only), is one component of a comprehensive MRSA colonization surveillance program. It is not intended to diagnose MRSA infection nor to guide or monitor treatment for MRSA infections. Performed at [facility], [place] [address] [place], [place] [address]   Culture, respiratory (non-expectorated)     Status: None   Collection Time: 07/17/20 12:16 PM   Specimen: Tracheal Aspirate; Respiratory  Result Value Ref Range Status   Specimen Description TRACHEAL ASPIRATE  Final   Special Requests NONE  Final   Gram Stain   Final    FEW WBC PRESENT, PREDOMINANTLY PMN FEW GRAM POSITIVE COCCI RARE GRAM POSITIVE RODS RARE YEAST Performed at [facility], [place]  [address] [place], [place] [address]    Culture FEW STAPHYLOCOCCUS AUREUS  Final   Report Status 07/20/2020 FINAL  Final   Organism ID, Bacteria STAPHYLOCOCCUS AUREUS  Final      Susceptibility   Staphylococcus aureus - MIC*    CIPROFLOXACIN <=0.5 SENSITIVE Sensitive     ERYTHROMYCIN <=0.25 SENSITIVE Sensitive     GENTAMICIN <=0.5 SENSITIVE Sensitive     OXACILLIN 0.5 SENSITIVE Sensitive     TETRACYCLINE <=1 SENSITIVE Sensitive     VANCOMYCIN 1 SENSITIVE Sensitive     TRIMETH/SULFA <=10 SENSITIVE Sensitive     CLINDAMYCIN <=0.25 SENSITIVE Sensitive     RIFAMPIN <=0.5 SENSITIVE Sensitive     Inducible Clindamycin NEGATIVE Sensitive     * FEW STAPHYLOCOCCUS AUREUS  Culture, respiratory (non-expectorated)     Status: None   Collection Time: 07/21/20  1:32 PM   Specimen: Tracheal Aspirate; Respiratory  Result Value Ref Range Status   Specimen Description TRACHEAL ASPIRATE  Final   Special Requests NONE  Final   Gram Stain   Final     MODERATE WBC PRESENT, PREDOMINANTLY PMN NO ORGANISMS SEEN    Culture   Final    NO GROWTH 2 DAYS Performed at [facility], [address] [place], [place] [address]    Report Status 07/23/2020 FINAL  Final    Assessment: 49 yo male with decompensated alcoholic liver cirrhosis. MD has discussed with GI and would like to proceed with NAC therapy for non-APAP associated liver failure.    Plan:  NAC 16020mg  IV x 1 over 1 hour NAC 24000 mg/67ml at a rate of 12.5mg /kg/hr x 4 hours NAC 24000 mg/659ml at a rate of 6.25 mg/kg/hr x 67 hours Continue to monitor transaminases  Yuniel Blaney A Devanee Pomplun 07/31/2020,12:41 PM

## 2020-07-31 NOTE — Progress Notes (Signed)
Inpatient Rehab Admissions Coordinator:   Received a denial from [facility] Medicare for CIR citing no medical necessity.  Dr. Reesa Chew completed peer to peer but denial upheld.  I let TOC and pt's sister know.  Will sign off for CIR at this time.   Shann Medal, PT, DPT Admissions Coordinator [phone] 07/31/20  1:49 PM

## 2020-07-31 NOTE — Progress Notes (Addendum)
PROGRESS NOTE    Linley Moxley  PFX:[id number] DOB: 1971-07-26 DOA: 07/16/2020 PCP: Nicolette Bang, DO   Brief Narrative:  49 year old with history of alcohol use, hepatic steatosis, HTN, GERD, schizoaffective disorder, found down at home for concerns of seizures who was intubated in the ER for airway protection.  Initially noted to have transaminitis, elevated bilirubin, lactic acidosis and leukocytosis.  EKG showed prolonged QT as well.  Ammonia was 207 as well.  GI was consulted.  Patient was initially extubated on 12/2 but due to worsening of his symptoms from aspiration he was reintubated on 12/3.  Then extubated again on 12/7.  Received multiple doses of antibiotics-vancomycin, Flagyl, cefepime, cefazolin.  Antibiotics were stopped.  Prednisone was continued.  Due to slight drop in hemoglobin without any overt signs of bleeding he received 1 unit PRBC.  No obvious evidence of bleeding.  T bili slowly trending upwards with limited option for intervention.  He is intermittently refusing PT.   Assessment & Plan:   Active Problems:   Encephalopathy, hepatic (HCC)   Pressure injury of skin   Acute respiratory failure (HCC)   Septic shock (HCC)   Alcoholic hepatitis with ascites  Decompensated alcohol liver cirrhosis with portal hypertension hepatic encephalopathy and transaminitis Hypoalbuminemia -Right upper quadrant ultrasound small volume of ascites with diminished velocities -Continue rifaximin.  Lactulose-follow mentation adjust as needed.  Need to have 3-4 bowel movements daily -No response to prednisone, therefore stopped -Limited ultrasound 12/14-minimal ascites -Monitor LFTs and T bili. Spoke with GI (Dr Silverio Decamp) - no role for HIDA at this time. Trial of NAC protocol-patient and family understands that this may not necessarily be helpful but there is little downside to light at this time and willing to try anything. -Encourage nutrition  Alcohol abuse -UDS also  positive for benzos and THC.  EEG negative for seizure -Alcohol withdrawal protocol -Thiamine, folic acid, multivitamin  Leukocytosis -Secondary to steroid use known to have been stopped.  No obvious evidence of infection.  Hypernatremia -Resolved  Essential hypertension -Not on any meds due to borderline low blood pressure.  Currently on midodrine 10 mg 3 times daily  Schizoaffective disorder -On Seroquel and Zoloft-resumed.  QTC resolved  Acute respiratory failure with hypoxia requiring intubation, resolved -Multifactorial in nature including aspiration pneumonia, concerns of seizure and encephalopathy.  Extubated 12/2 re intubated 12/2.  Eventually reextubated on 12/7.  Completed antibiotic course  Prolonged QTC -Resolved  Macrocytic anemia, hemoglobin-6.8 -TSH B12 - normal.  No obvious evidence of bleeding.  Closely monitor hemoglobin, if drops below 7.0, transfuse accordingly -Folate-normal  PT-recommended supervision-CIR Speech and swallow-dysphagia 3 diet  Overall patient has very poor prognosis because of advanced liver failure. Seen by palliative care-appreciate their input.  Wants full therapy for now  DVT prophylaxis: Subcutaneous heparin Code Status: Full code Family Communication: Called daughter but she did not answer therefore spoke with patient's mother Katharine Look. She is updated Status is: Inpatient  Remains inpatient appropriate because:Inpatient level of care appropriate due to severity of illness   Dispo: The patient is from: Home              Anticipated d/c is to: CIR              Anticipated d/c date is: 1 day              Patient currently is overall medically stable for bilirubin continues to trend upwards along with LFTs    Body mass index is 30.05 kg/m.  Pressure Injury 07/16/20 Buttocks Medial Stage 1 -  Intact skin with non-blanchable redness of a localized area usually over a bony prominence. (Active)  07/16/20 2000  Location: Buttocks   Location Orientation: Medial  Staging: Stage 1 -  Intact skin with non-blanchable redness of a localized area usually over a bony prominence.  Wound Description (Comments):   Present on Admission: Yes       Subjective: Patient seen and examined at bedside no complaints this morning. He has been declining physical therapy and I explained to him that it is very important to participate in therapy in order informed to progress. I have also explained to him trial of N-acetylcysteine infusion for a brief period of time. He understands it may not really help him but there is no true side effects to it at this time therefore willing to give it a try.   Examination: Constitutional: Not in acute distress Respiratory: Clear to auscultation bilaterally Cardiovascular: Normal sinus rhythm, no rubs Abdomen: Nontender nondistended good bowel sounds Musculoskeletal: No edema noted Skin: Scleral icterus and jaundice Neurologic: CN 2-12 grossly intact.  And nonfocal Psychiatric: Normal judgment and insight. Alert and oriented x 3. Normal mood.  Objective: Vitals:   07/30/20 1719 07/30/20 1944 07/30/20 2344 07/31/20 0500  BP: 122/77 128/75 113/70 107/67  Pulse: 93 75 79 90  Resp: 20 18 16    Temp: (!) 97.4 F (36.3 C) 98 F (36.7 C) 97.9 F (36.6 C) (!) 97.5 F (36.4 C)  TempSrc: Oral Oral Oral Oral  SpO2: 99% 98% 95% 99%  Weight:      Height:        Intake/Output Summary (Last 24 hours) at 07/31/2020 0746 Last data filed at 07/31/2020 0500 Gross per 24 hour  Intake 410 ml  Output --  Net 410 ml   Filed Weights   07/25/20 0500 07/28/20 0502 07/29/20 0500  Weight: 100.5 kg 105.6 kg 106.8 kg     Data Reviewed:   CBC: Recent Labs  Lab 07/27/20 0413 07/28/20 0443 07/29/20 0123 07/30/20 0457 07/31/20 0445  WBC 23.4* 19.3* 18.7* 19.1* 16.9*  HGB 7.1* 6.8* 7.8* 8.4* 7.8*  HCT 22.7* 20.8* 24.7* 27.0* 24.7*  MCV 103.7* 103.5* 100.0 102.7* 103.3*  PLT 134* 143* 135* 157 283    Basic Metabolic Panel: Recent Labs  Lab 07/25/20 0521 07/26/20 0630 07/27/20 0413 07/28/20 0443 07/29/20 0123 07/30/20 0457 07/31/20 0445  NA 157*   < > 147* 143 144 141 140  K 3.4*   < > 3.4* 3.5 3.6 3.6 3.7  CL 123*   < > 118* 116* 117* 113* 114*  CO2 21*   < > 20* 19* 18* 18* 18*  GLUCOSE 115*   < > 128* 124* 116* 145* 129*  BUN 45*   < > 29* 25* 23* 21* 20  CREATININE 0.94   < > 0.84 0.89 0.86 0.79 0.86  CALCIUM 8.8*   < > 8.4* 8.3* 8.3* 8.5* 8.5*  MG  --   --  2.2 2.2 2.2 2.2 2.2  PHOS 4.0  --   --   --   --   --   --    < > = values in this interval not displayed.   GFR: Estimated Creatinine Clearance: 134.1 mL/min (by C-G formula based on SCr of 0.86 mg/dL). Liver Function Tests: Recent Labs  Lab 07/27/20 0413 07/28/20 0443 07/29/20 0123 07/30/20 0457 07/31/20 0445  AST 187* 208* 252* 292* 257*  ALT 58* 77* 110*  149* 173*  ALKPHOS 213* 199* 197* 231* 222*  BILITOT 25.7* 26.8* 28.3* 32.6* 29.1*  PROT 5.3* 5.0* 5.1* 5.5* 5.0*  ALBUMIN 1.9* 1.9* 1.8* 1.9* 1.8*   No results for input(s): LIPASE, AMYLASE in the last 168 hours. No results for input(s): AMMONIA in the last 168 hours. Coagulation Profile: No results for input(s): INR, PROTIME in the last 168 hours. Cardiac Enzymes: No results for input(s): CKTOTAL, CKMB, CKMBINDEX, TROPONINI in the last 168 hours. BNP (last 3 results) No results for input(s): PROBNP in the last 8760 hours. HbA1C: No results for input(s): HGBA1C in the last 72 hours. CBG: Recent Labs  Lab 07/26/20 0353 07/26/20 0801 07/26/20 1206 07/26/20 1510 07/27/20 0250  GLUCAP 138* 135* 180* 158* 123*   Lipid Profile: No results for input(s): CHOL, HDL, LDLCALC, TRIG, CHOLHDL, LDLDIRECT in the last 72 hours. Thyroid Function Tests: No results for input(s): TSH, T4TOTAL, FREET4, T3FREE, THYROIDAB in the last 72 hours. Anemia Panel: No results for input(s): VITAMINB12, FOLATE, FERRITIN, TIBC, IRON, RETICCTPCT in the last 72  hours. Sepsis Labs: No results for input(s): PROCALCITON, LATICACIDVEN in the last 168 hours.  Recent Results (from the past 240 hour(s))  Culture, respiratory (non-expectorated)     Status: None   Collection Time: 07/21/20  1:32 PM   Specimen: Tracheal Aspirate; Respiratory  Result Value Ref Range Status   Specimen Description TRACHEAL ASPIRATE  Final   Special Requests NONE  Final   Gram Stain   Final    MODERATE WBC PRESENT, PREDOMINANTLY PMN NO ORGANISMS SEEN    Culture   Final    NO GROWTH 2 DAYS Performed at [facility], [address] [place], [place] [address]    Report Status 07/23/2020 FINAL  Final         Radiology Studies: US Abdomen Limited  Result Date: 07/31/2020 CLINICAL DATA:  Ascites. EXAM: LIMITED ABDOMEN ULTRASOUND FOR ASCITES TECHNIQUE: Limited ultrasound survey for ascites was performed in all four abdominal quadrants. COMPARISON:  Ultrasound 07/16/2020. FINDINGS: Tiny amount ascites noted.  No prominent fluid collection noted. IMPRESSION: Tiny amount of ascites. Electronically Signed   By: [place]   On: 07/31/2020 05:30        Scheduled Meds: . vitamin C  1,000 mg Oral Daily  . chlorhexidine  15 mL Mouth Rinse BID  . Chlorhexidine Gluconate Cloth  6 each Topical Daily  . cholecalciferol  2,000 Units Oral Daily  . feeding supplement  237 mL Oral TID BM  . folic acid  1 mg Oral Daily  . heparin injection (subcutaneous)  5,000 Units Subcutaneous Q8H  . lactulose  20 g Oral TID  . mouth rinse  15 mL Mouth Rinse q12n4p  . midodrine  10 mg Oral TID WC  . multivitamin with minerals  1 tablet Oral Daily  . pantoprazole  40 mg Oral BID  . QUEtiapine  50 mg Oral QHS  . rifaximin  550 mg Oral BID  . sertraline  25 mg Oral Daily  . sodium chloride flush  10-40 mL Intracatheter Q12H  . thiamine  100 mg Oral Daily   Continuous Infusions: . sodium chloride Stopped (07/17/20 1958)     LOS: 15 days   Time spent= 35 mins    Steffon Gladu  Arsenio Loader, MD [facility]  If 7PM-7AM, please contact night-coverage  07/31/2020, 7:46 AM

## 2020-07-31 NOTE — Progress Notes (Signed)
Physical Therapy Treatment Patient Details Name: [name] MRN: [id number] DOB: 1970-10-03 Today's Date: 07/31/2020    History of Present Illness 49 y.o. male admitted with AMS with encephalopathy and alcholic hepatitis. Intubated in ED on 11/29, extubated and reintubated 12/2. PMHx: alcoholism, HTN, GERD, schizoaffective disorder    PT Comments    Pt feeling better and able to participate in session. Pt demonstrating improved ability to perform bed moiblity with HOB elevated. Pt motivated to stand from EOB with bed elevated, requiring min-mod A during transfers. Pt with increased motivation to participate in session, easily fatigues and needs rest breaks. Pt with increased concern regarding missing swallow study and requested to remain in room. Pt continues to demonstrate deficits in balance, strength, coordination, endurance, safety and gait and will benefit from skilled PT to address deficits to maximize independence with functional mobility prior to discharge. Continue to recommend CIR to progress pt to PLOF, following session today pt can participate in 3 hours of therapy     Follow Up Recommendations  CIR;Supervision/Assistance - 24 hour     Equipment Recommendations  Rolling walker with 5" wheels;3in1 (PT)    Recommendations for Other Services       Precautions / Restrictions Precautions Precautions: None Restrictions Weight Bearing Restrictions: No    Mobility  Bed Mobility Overal bed mobility: Needs Assistance Bed Mobility: Sit to Supine;Supine to Sit Rolling: Supervision   Supine to sit: Supervision Sit to supine: Supervision   General bed mobility comments: increased time needed and HOB elevated  Transfers Overall transfer level: Needs assistance Equipment used: Rolling walker (2 wheeled) Transfers: Sit to/from Stand Sit to Stand: Min assist;Mod assist         General transfer comment: performed 5 reps of sit<>stand from elevated EOB, mod needed x 2  trials and min x 3 trials. pt easily fatigued and became short of breath requiring termination of activity  Ambulation/Gait                 Stairs             Wheelchair Mobility    Modified Rankin (Stroke Patients Only)       Balance Overall balance assessment: Needs assistance Sitting-balance support: Feet supported Sitting balance-Leahy Scale: Fair     Standing balance support: Bilateral upper extremity supported Standing balance-Leahy Scale: Poor Standing balance comment: need for min A and use of RW                            Cognition Arousal/Alertness: Awake/alert Behavior During Therapy: WFL for tasks assessed/performed Overall Cognitive Status: Within Functional Limits for tasks assessed                                        Exercises General Exercises - Lower Extremity Ankle Circles/Pumps: AROM;Strengthening;Both;10 reps;Seated Long Arc Quad: AROM;Strengthening;Both;10 reps;Seated Hip Flexion/Marching: AROM;Strengthening;Both;10 reps;Seated    General Comments General comments (skin integrity, edema, etc.): pt able to sit in bed in long sit and don socks without assist, increased time needed and pt fatigued quickly      Pertinent Vitals/Pain Pain Assessment: 0-10 Pain Score: 3  Pain Location: abdomen Pain Descriptors / Indicators: Discomfort    Home Living                      Prior Function  PT Goals (current goals can now be found in the care plan section) Acute Rehab PT Goals Patient Stated Goal: I want to get out of the bed PT Goal Formulation: With patient Time For Goal Achievement: 08/05/20 Potential to Achieve Goals: Good Progress towards PT goals: Progressing toward goals    Frequency    Min 3X/week      PT Plan Current plan remains appropriate    Bautista-evaluation              AM-PAC PT "6 Clicks" Mobility   Outcome Measure  Help needed turning from your back  to your side while in a flat bed without using bedrails?: None Help needed moving from lying on your back to sitting on the side of a flat bed without using bedrails?: A Little Help needed moving to and from a bed to a chair (including a wheelchair)?: A Lot Help needed standing up from a chair using your arms (e.g., wheelchair or bedside chair)?: A Lot Help needed to walk in hospital room?: A Lot Help needed climbing 3-5 steps with a railing? : A Lot 6 Click Score: 15    End of Session Equipment Utilized During Treatment: Gait belt Activity Tolerance: Patient tolerated treatment well Patient left: in bed;with call bell/phone within reach;with bed alarm set;with restraints reapplied;with SCD's reapplied;with nursing/sitter in room Nurse Communication: Mobility status PT Visit Diagnosis: Other abnormalities of gait and mobility (R26.89);Difficulty in walking, not elsewhere classified (R26.2)     Time: 5956-3875 PT Time Calculation (min) (ACUTE ONLY): 19 min  Charges:  $Therapeutic Exercise: 8-22 mins                     [name], DPT Acute Rehabilitation Services [phone]   [name] 07/31/2020, 12:23 PM

## 2020-07-31 NOTE — Progress Notes (Signed)
OT Cancellation Note  Patient Details Name: [name] MRN: [id number] DOB: September 09, 1970   Cancelled Treatment:    Reason Eval/Treat Not Completed: Patient declined, no reason specified patient reports fatigue from previous PT session earlier this date declining bed level, EOB or OOB activity at this time. OT will continue efforts toward POC.   Gloris Manchester OTR/L Supplemental OT, Department of rehab services [phone]  Jerell Demery R H. 07/31/2020, 2:43 PM

## 2020-07-31 NOTE — NC FL2 (Signed)
[place] LEVEL OF CARE SCREENING TOOL     IDENTIFICATION  Patient Name: [name] Birthdate: Oct 12, 1970 Sex: male Admission Date (Current Location): 07/16/2020  [facility] and [place] Number:  Herbalist and Address:  [facility] [place], [place] [address], [place], [place] [address]      Provider Number: [id number]  Attending Physician Name and Address:  Damita Lack, MD  Relative Name and Phone Number:       Current Level of Care: Hospital Recommended Level of Care: [place] Prior Approval Number:    Date Approved/Denied:   PASRR Number: under review  Discharge Plan: SNF    Current Diagnoses: Patient Active Problem List   Diagnosis Date Noted  . Alcoholic hepatitis with ascites   . Acute respiratory failure ([place])   . Septic shock ([place])   . Pressure injury of skin 07/17/2020  . Encephalopathy, hepatic ([place]) 07/16/2020  . Alcoholic cirrhosis of liver with ascites ([place])   . Hypomagnesemia 05/03/2020  . Hyperbilirubinemia 05/03/2020  . D-dimer, elevated 05/03/2020  . Alcoholism ([place]) 05/03/2020  . Rhabdomyolysis 03/06/2019  . Alcoholic hepatitis without ascites 03/06/2019  . Alcohol withdrawal ([place]) 01/15/2019  . HTN (hypertension) 01/10/2019  . Fall 01/10/2019  . Alcohol intoxication ([place]) 01/10/2019  . Schizoaffective disorder, bipolar type ([place]) 06/02/2018  . Alcohol dependence with uncomplicated withdrawal ([place]) 02/22/2016  . Alcohol-induced mood disorder ([place]) 02/22/2016  . Gout 10/22/2015  . GERD (gastroesophageal reflux disease) 10/22/2015  . Alcohol withdrawal seizure with complication ([place]) 58/85/0277  . Schizoaffective disorder, depressive type ([place])   . History of alcohol abuse   . Tobacco dependence due to cigarettes 11/20/2014  . Alcohol abuse with intoxication ([place])   . Hyponatremia 10/18/2014  . Abnormal LFTs   . Alcohol abuse 02/09/2014  . Post traumatic stress disorder (PTSD)  11/03/2011  . Hypokalemia 07/02/2011    Orientation RESPIRATION BLADDER Height & Weight     Self,Time,Situation,Place  Normal Incontinent Weight: 106.8 kg Height:  6' (182.9 cm)  BEHAVIORAL SYMPTOMS/MOOD NEUROLOGICAL BOWEL NUTRITION STATUS      Incontinent Diet (dysphagia 3 with nectar thick liquids)  AMBULATORY STATUS COMMUNICATION OF NEEDS Skin   Extensive Assist Verbally Bruising (generalized)                       Personal Care Assistance Level of Assistance  Bathing,Feeding,Dressing Bathing Assistance: Maximum assistance Feeding assistance: Limited assistance Dressing Assistance: Maximum assistance     Functional Limitations Info  Sight,Hearing,Speech Sight Info: Adequate Hearing Info: Adequate Speech Info: Adequate    SPECIAL CARE FACTORS FREQUENCY  PT (By licensed PT),OT (By licensed OT),Speech therapy     PT Frequency: 5x/wk OT Frequency: 5x/wk     Speech Therapy Frequency: 5x/wk      Contractures Contractures Info: Not present    Additional Factors Info  Code Status,Allergies,Psychotropic Code Status Info: Full Allergies Info: Wellbutrin Psychotropic Info: seroquel 50 mg at bedtime/ zologt 25 mg daily         Current Medications (07/31/2020):  This is the current hospital active medication list Current Facility-Administered Medications  Medication Dose Route Frequency Provider Last Rate Last Admin  . 0.9 %  sodium chloride infusion   Intravenous PRN Kipp Brood, MD   Stopped at 07/17/20 1958  . acetylcysteine (ACETADOTE) 40 mg/mL load via infusion 16,020 mg  150 mg/kg Intravenous Once Pierce, Dwayne A, RPH       Followed by  . acetylcysteine (ACETADOTE)  24,000 mg in dextrose 5 % 600 mL (40 mg/mL) infusion  12.5 mg/kg/hr Intravenous Continuous Pierce, Dwayne A, RPH       Followed by  . acetylcysteine (ACETADOTE) 24,000 mg in dextrose 5 % 600 mL (40 mg/mL) infusion  6.25 mg/kg/hr Intravenous Continuous Pierce, Dwayne A, RPH      . alum & mag  hydroxide-simeth (MAALOX/MYLANTA) 200-200-20 MG/5ML suspension 30 mL  30 mL Oral Once PRN Shela Leff, MD      . ascorbic acid (VITAMIN C) tablet 1,000 mg  1,000 mg Oral Daily Audria Nine, DO   1,000 mg at 07/31/20 0826  . chlorhexidine (PERIDEX) 0.12 % solution 15 mL  15 mL Mouth Rinse BID Audria Nine, DO   15 mL at 07/31/20 0825  . Chlorhexidine Gluconate Cloth 2 % PADS 6 each  6 each Topical Daily Freddi Starr, MD   6 each at 07/31/20 [phone]  . cholecalciferol (VITAMIN D3) tablet 2,000 Units  2,000 Units Oral Daily Audria Nine, DO   2,000 Units at 07/31/20 0355  . dextromethorphan-guaiFENesin (MUCINEX DM) 30-600 MG per 12 hr tablet 1 tablet  1 tablet Oral BID PRN Amin, Ankit Chirag, MD      . docusate sodium (COLACE) capsule 100 mg  100 mg Oral BID PRN Amin, Ankit Chirag, MD      . feeding supplement (ENSURE ENLIVE / ENSURE PLUS) liquid 237 mL  237 mL Oral TID BM Audria Nine, DO   237 mL at 07/31/20 0826  . folic acid (FOLVITE) tablet 1 mg  1 mg Oral Daily Audria Nine, DO   1 mg at 07/31/20 0826  . heparin injection 5,000 Units  5,000 Units Subcutaneous Q8H Iona Beard, MD   5,000 Units at 07/31/20 1306  . ibuprofen (ADVIL) chewable tablet 200 mg  200 mg Oral Q8H PRN Merlene Laughter F, NP   200 mg at 07/31/20 0824  . ipratropium-albuterol (DUONEB) 0.5-2.5 (3) MG/3ML nebulizer solution 3 mL  3 mL Nebulization Q4H PRN Amin, Ankit Chirag, MD      . lactulose (CHRONULAC) 10 GM/15ML solution 20 g  20 g Oral TID Amin, Ankit Chirag, MD   20 g at 07/31/20 0825  . MEDLINE mouth rinse  15 mL Mouth Rinse q12n4p Audria Nine, DO   15 mL at 07/30/20 1650  . midodrine (PROAMATINE) tablet 10 mg  10 mg Oral TID WC Amin, Ankit Chirag, MD   10 mg at 07/31/20 1306  . multivitamin with minerals tablet 1 tablet  1 tablet Oral Daily Audria Nine, DO   1 tablet at 07/31/20 9741  . pantoprazole (PROTONIX) EC tablet 40 mg  40 mg Oral BID Vena Rua, PA-C   40 mg  at 07/31/20 6384  . polyethylene glycol (MIRALAX / GLYCOLAX) packet 17 g  17 g Oral Daily PRN Amin, Ankit Chirag, MD      . QUEtiapine (SEROQUEL) tablet 50 mg  50 mg Oral QHS Amin, Ankit Chirag, MD   50 mg at 07/30/20 2155  . Resource ThickenUp Clear   Oral PRN Audria Nine, DO      . rifaximin Doreene Nest) tablet 550 mg  550 mg Oral BID Audria Nine, DO   550 mg at 07/31/20 5364  . sertraline (ZOLOFT) tablet 25 mg  25 mg Oral Daily Amin, Ankit Chirag, MD   25 mg at 07/31/20 0825  . sodium chloride flush (NS) 0.9 % injection 10-40 mL  10-40 mL Intracatheter Q12H Kipp Brood, MD   10 mL  at 07/31/20 0827  . sodium chloride flush (NS) 0.9 % injection 10-40 mL  10-40 mL Intracatheter PRN Kipp Brood, MD   10 mL at 07/26/20 2258  . thiamine tablet 100 mg  100 mg Oral Daily Audria Nine, DO   100 mg at 07/31/20 0825     Discharge Medications: Please see discharge summary for a list of discharge medications.  Relevant Imaging Results:  Relevant Lab Results:   Additional Information SS#: [id number]  Pollie Friar, RN

## 2020-07-31 NOTE — Progress Notes (Signed)
Modified Barium Swallow Progress Note  Patient Details  Name: [name] MRN: [id number] Date of Birth: [birth year]  Today's Date: 07/31/2020  Modified Barium Swallow completed.  Full report located under Chart Review in the Imaging Section.  Brief recommendations include the following:  Clinical Impression  Oropharyngeal swallow function has improved specifically in regards to oral cohesion, propulsion and onset of swallows. He propelled thin boluses orally with timely initiation of laryngeal closure. Difficulty controlling dual consistencies with barium pill and thin. Barium partially spilled to pyriforms wtih thin penetrating vocal cords as pill entered esophagus without awareness. Mastication was slow, deliberate functional. Recommended upgrade to regular texture, thin liquids, straws allowed and pills whole in puree (RN updated). Brief follow up with ST.   Swallow Evaluation Recommendations       SLP Diet Recommendations: Regular solids;Thin liquid   Liquid Administration via: Cup;Straw   Medication Administration: Whole meds with puree   Supervision: Patient able to self feed;Staff to assist with self feeding   Compensations: Slow rate;Small sips/bites   Postural Changes: Seated upright at 90 degrees   Oral Care Recommendations: Oral care BID        [name] 07/31/2020,2:57 PM  [name].Ed Risk analyst [phone] Office [phone]

## 2020-07-31 NOTE — Progress Notes (Signed)
  Speech Language Pathology Treatment: Dysphagia  Patient Details Name: [name] MRN: [id number] DOB: 1971/06/19 Today's Date: 07/31/2020 Time: 0511-0211 SLP Time Calculation (min) (ACUTE ONLY): 17 min  Assessment / Plan / Recommendation Clinical Impression  Pt has history of inconsistent silent aspiration revealed during last weeks MBS and has been refusing thickened liquids. Approved by ST for sips water only after oral care and should continue pills whole in pudding/applesauce and not with thin liquids as reported by nursing. He recalls breath hold strategy and states he is using prior to swallow. Therapist observed sip water- it appeared that he implemented breath hold but cannot determine from observation. Repeat MBS is scheduled for 1300 today to assess current status.    HPI HPI: 49 y.o. male admitted with AMS with encephalopathy and alcholic hepatitis. Intubated in ED on 11/29-12/2, reintubated 12/2-12/7.  PMHx: alcoholism, HTN, GERD, schizoaffective disorder, PTSD. MBS 12/9: mild pharyngeal dysphagia with trace inconsistent silent aspiration from premature spill and mistimed laryngeal closure. Thin liquids were penetrated to cords with cup and aspirated with straw. Sensation was not consistent and he demonstrated both cough and no cough in response to cord penetration and aspiration. New order received 12/12 with comment "Pt feels that he can swallow thin liquids now and had drank some Gatorade upon assessment and tolerated well."      SLP Plan  MBS       Recommendations  Diet recommendations: Dysphagia 3 (mechanical soft);Nectar-thick liquid Liquids provided via: Cup;No straw Medication Administration: Whole meds with puree Supervision: Patient able to self feed Compensations: Slow rate;Small sips/bites;Clear throat intermittently Postural Changes and/or Swallow Maneuvers: Seated upright 90 degrees                Oral Care Recommendations: Oral care BID;Oral care  prior to ice chip/H20 Follow up Recommendations: Inpatient Rehab SLP Visit Diagnosis: Dysphagia, unspecified (R13.10) Plan: MBS       GO                [name] 07/31/2020, 8:58 AM Orbie Pyo Colvin Caroli.Ed Risk analyst [phone] Office [phone]

## 2020-07-31 NOTE — Progress Notes (Signed)
Re: [name] DOB: [birth year] Date: 07/31/2020   To Whom It May Concern:  Please be advised that the above-named patient will require a short-term nursing home stay--anticipated 30 days or less for rehabilitation and strengthening. The plan is for home.

## 2020-07-31 NOTE — Progress Notes (Signed)
PT Cancellation Note  Patient Details Name: [name] MRN: [id number] DOB: 11/24/1970   Cancelled Treatment:    Reason Eval/Treat Not Completed: Patient at procedure or test/unavailable  Lyanne Co, DPT Acute Rehabilitation Services [phone]  [name] 07/31/2020, 9:33 AM

## 2020-08-01 LAB — COMPREHENSIVE METABOLIC PANEL
ALT: 173 U/L — ABNORMAL HIGH (ref 0–44)
AST: 216 U/L — ABNORMAL HIGH (ref 15–41)
Albumin: 1.6 g/dL — ABNORMAL LOW (ref 3.5–5.0)
Alkaline Phosphatase: 217 U/L — ABNORMAL HIGH (ref 38–126)
Anion gap: 13 (ref 5–15)
BUN: 20 mg/dL (ref 6–20)
CO2: 16 mmol/L — ABNORMAL LOW (ref 22–32)
Calcium: 8.4 mg/dL — ABNORMAL LOW (ref 8.9–10.3)
Chloride: 111 mmol/L (ref 98–111)
Creatinine, Ser: 0.95 mg/dL (ref 0.61–1.24)
GFR, Estimated: >60 mL/min (ref >60)
Glucose, Bld: 107 mg/dL — ABNORMAL HIGH (ref 70–99)
Potassium: 3.1 mmol/L — ABNORMAL LOW (ref 3.5–5.1)
Sodium: 140 mmol/L (ref 135–145)
Total Bilirubin: 28.9 mg/dL (ref 0.3–1.2)
Total Protein: 4.8 g/dL — ABNORMAL LOW (ref 6.5–8.1)

## 2020-08-01 LAB — MAGNESIUM: Magnesium: 2.1 mg/dL (ref 1.7–2.4)

## 2020-08-01 LAB — CBC
HCT: 24.9 % — ABNORMAL LOW (ref 39.0–52.0)
Hemoglobin: 7.8 g/dL — ABNORMAL LOW (ref 13.0–17.0)
MCH: 32.2 pg (ref 26.0–34.0)
MCHC: 31.3 g/dL (ref 30.0–36.0)
MCV: 102.9 fL — ABNORMAL HIGH (ref 80.0–100.0)
Platelets: 144 10*3/uL — ABNORMAL LOW (ref 150–400)
RBC: 2.42 MIL/uL — ABNORMAL LOW (ref 4.22–5.81)
RDW: 27.8 % — ABNORMAL HIGH (ref 11.5–15.5)
WBC: 13.2 10*3/uL — ABNORMAL HIGH (ref 4.0–10.5)
nRBC: 0 % (ref 0.0–0.2)

## 2020-08-01 MED ORDER — B COMPLEX-C PO TABS
1.0000 | ORAL_TABLET | Freq: Every day | ORAL | Status: DC
  Administered 2020-08-01 – 2020-08-06 (×6): 1 via ORAL
  Filled 2020-08-01 (×6): qty 1

## 2020-08-01 MED ORDER — VITAMIN A 3 MG (10000 UNIT) PO CAPS
10000.0000 [IU] | ORAL_CAPSULE | Freq: Every day | ORAL | Status: DC
  Administered 2020-08-02 – 2020-08-06 (×5): 10000 [IU] via ORAL
  Filled 2020-08-01 (×6): qty 1

## 2020-08-01 MED ORDER — POTASSIUM CHLORIDE CRYS ER 20 MEQ PO TBCR
40.0000 meq | EXTENDED_RELEASE_TABLET | ORAL | Status: AC
  Administered 2020-08-01 (×2): 40 meq via ORAL
  Filled 2020-08-01 (×2): qty 2

## 2020-08-01 MED ORDER — VITAMIN C 250 MG PO TABS
250.0000 mg | ORAL_TABLET | Freq: Two times a day (BID) | ORAL | Status: DC
  Administered 2020-08-01 – 2020-08-06 (×10): 250 mg via ORAL
  Filled 2020-08-01 (×12): qty 1

## 2020-08-01 NOTE — Plan of Care (Signed)
  Problem: Education: Goal: Knowledge of General Education information will improve Description: Including pain rating scale, medication(s)/side effects and non-pharmacologic comfort measures Outcome: Progressing   Problem: Clinical Measurements: Goal: Ability to maintain clinical measurements within normal limits will improve Outcome: Progressing Goal: Will remain free from infection Outcome: Progressing Goal: Diagnostic test results will improve Outcome: Progressing Goal: Respiratory complications will improve Outcome: Progressing Goal: Cardiovascular complication will be avoided Outcome: Progressing   Problem: Coping: Goal: Level of anxiety will decrease Outcome: Progressing   Problem: Pain Managment: Goal: General experience of comfort will improve Outcome: Progressing   Problem: Safety: Goal: Ability to remain free from injury will improve Outcome: Progressing

## 2020-08-01 NOTE — Progress Notes (Addendum)
  Speech Language Pathology Treatment: Dysphagia  Patient Details Name: [name] MRN: [id number] DOB: 05/26/1971 Today's Date: 08/01/2020 Time: 8882-8003 SLP Time Calculation (min) (ACUTE ONLY): 13 min  Assessment / Plan / Recommendation Clinical Impression  Pt seen with thin liquids, reiterated aspiration precautions. Pt observed to cough x2 during session after sips. Pt still mildly dysphonic and occasionally penetration events are not unexpected. Encouraged oral hygiene to reduce bacterial load and provided set up assist and min verbal cues for pt to brush teeth at bed level. Pt reports tolerating regular diet well, ate a grilled cheese yesterday. Continue current diet, all education complete, will sign off.   HPI HPI: 49 y.o. male admitted with AMS with encephalopathy and alcholic hepatitis. Intubated in ED on 11/29-12/2, reintubated 12/2-12/7.  PMHx: alcoholism, HTN, GERD, schizoaffective disorder, PTSD. MBS 12/9: mild pharyngeal dysphagia with trace inconsistent silent aspiration from premature spill and mistimed laryngeal closure. Thin liquids were penetrated to cords with cup and aspirated with straw. Sensation was not consistent and he demonstrated both cough and no cough in response to cord penetration and aspiration. New order received 12/12 with comment "Pt feels that he can swallow thin liquids now and had drank some Gatorade upon assessment and tolerated well." Repeat MBS for possible liquid upgrade.      SLP Plan          Recommendations  Diet recommendations: Regular;Thin liquid Liquids provided via: Cup;Straw Medication Administration: Whole meds with puree Supervision: Patient able to self feed Compensations: Slow rate;Small sips/bites Postural Changes and/or Swallow Maneuvers: Seated upright 90 degrees                        GO             Ryan Baltimore, MA CCC-SLP  Acute Rehabilitation Services Pager [phone] Office  [phone]    Lynann Beaver 08/01/2020, 9:15 AM

## 2020-08-01 NOTE — Progress Notes (Signed)
Nutrition Follow-up  DOCUMENTATION CODES:   Not applicable  INTERVENTION:   Continue Ensure Enlive po TID, each supplement provides 350 kcal and 20 grams of protein  Continue Magic cup TID with meals, each supplement provides 290 kcal and 9 grams of protein  Vitamin repletion as follows (based on lab results listed below):  B-complex with vitamin C daily  211m vitamin C po BID   162694IU vitamin A po daily for 1-2 weeks   Continue 2000 units vitamin D3 po daily  Vitamin labs:  Vitamin D: 11.5 - being repleted Vitamin C: <0.1 - being repleted Vitamin A: <2.5 (L) Vitamin B6: 1.8 (L)   Given pt's continued poor PO intake, recommend initiation of nutrition support via Cortrak/113fNGT if aligned with pt's GOC. Consider: Osmolite 1.5 cal @ 6049mr (1440m45mith 90ml54msource TF TID. This would provide pt with 2400 kcals, 156 grams of protein, 1097ml 34m water   NUTRITION DIAGNOSIS:   Increased nutrient needs related to catabolic illness as evidenced by estimated needs.  ongoing  GOAL:   Patient will meet greater than or equal to 90% of their needs  progressing  MONITOR:   TF tolerance,Vent status,Labs  REASON FOR ASSESSMENT:   Consult,Ventilator Enteral/tube feeding initiation and management  ASSESSMENT:   Pt with PMH of alcoholic liver disease, severe alcohol abuse, hepatic steatosis, essential hypertension, GERD, PTSD, depression, and schizoaffective disorder admitted after being found down at home with Severe toxic metabolic encephalopathy due to hepatic encephalopathy, severe alcoholic hepatitis, alcoholic cirrhosis, severe hypokalemia, and AKI.  11/30 imitated TF at 20 ml/hr due to low K+/PO4 12/2 extubated; re-intubated overnight  12/4 pt vomited - TF held 12/5 TF restarted @ 10 ml/hr 12/7 reextubated, OG removed 12/8 initiated on regular po diet s/p swallow eval  Per MD, pt with very poor prognosis. PMT met with pt yesterday. Pt would like to  remain full scope of care for now.   Pt with very poor po intake. 4 meals charted as 0-25% completion (15% average meal intake). Pt has orders for Ensure Enlive TID, but per RN, pt has refused these supplements the last 3 times they were offered. Will continue with orders as pt was previously drinking well and pt is not eating well. Hopeful pt will begin accepting supplements again.  If pt's intake does not improve and if aligned with goals of care, recommend initiation of nutrition support via Cortrak/10fr [id number] recommendations above.   UOP: 600ml x263murs  Labs: K+ 3.1 (L) Vitamin D: 11.5 (L) Vitamin C: <0.1 (L) Vitamin A: <2.5 (L) Vitamin B6: 1.8 (L) Medications: vitamin c, vitamin D3, folvite, chronulac, mvi with minerals, klor-con, thiamine  Discussed pt's micronutrient deficiencies with MD/Pharmacy. Received approval to replete as described in interventions.   Diet Order:   Diet Order            Diet regular Room service appropriate? Yes with Assist; Fluid consistency: Thin  Diet effective now                 EDUCATION NEEDS:   No education needs have been identified at this time  Skin:  Skin Assessment: Skin Integrity Issues: Skin Integrity Issues:: Stage I Stage I: buttocks  Last BM:  1050 ml via rectal tube  Height:   Ht Readings from Last 1 Encounters:  07/16/20 6' (1.829 m)    Weight:   Wt Readings from Last 1 Encounters:  07/29/20 106.8 kg    Ideal Body Weight:  80.9  kg  BMI:  Body mass index is 31.93 kg/m.  Estimated Nutritional Needs:   Kcal:  2200-2400  Protein:  150-175 grams  Fluid:  2 L/day    Larkin Ina, MS, RD, LDN RD pager number and weekend/on-call pager number located in [place].

## 2020-08-01 NOTE — Progress Notes (Signed)
Physical Therapy Treatment Patient Details Name: [name] MRN: [id number] DOB: March 19, 1971 Today's Date: 08/01/2020    History of Present Illness 49 y.o. male admitted with AMS with encephalopathy and alcholic hepatitis. Intubated in ED on 11/29, extubated and reintubated 12/2. PMHx: alcoholism, HTN, GERD, schizoaffective disorder    PT Comments    Pt's activity tolerance remains limited, only tolerating very short bouts of standing and marching at the edge of bed. Pt requries frequent rest breaks and requests to stop mobilizing after 2nd marching trial. PT encourages the pt to perform LE HEP to improve LE strength and endurance. Pt will continue to benefit from acute PT POC to improve mobility quality and reduce falls risk. PT updates recommendations to SNF placement as the pt was denied CIR by insurance.   Follow Up Recommendations  Supervision/Assistance - 24 hour;SNF (pt denied CIR by insurance)     Equipment Recommendations  Rolling walker with 5" wheels;3in1 (PT)    Recommendations for Other Services       Precautions / Restrictions Precautions Precautions: Fall Restrictions Weight Bearing Restrictions: No    Mobility  Bed Mobility Overal bed mobility: Needs Assistance Bed Mobility: Supine to Sit;Sit to Supine     Supine to sit: Supervision;HOB elevated Sit to supine: Supervision;HOB elevated   General bed mobility comments: use of railing  Transfers Overall transfer level: Needs assistance Equipment used: Rolling walker (2 wheeled) Transfers: Sit to/from Stand Sit to Stand: Min assist;From elevated surface         General transfer comment: pt performs 2 sit to stands  Ambulation/Gait Ambulation/Gait assistance: Herbalist (Feet): 2 Feet Assistive device: Rolling walker (2 wheeled) Gait Pattern/deviations: Step-to pattern Gait velocity: slow Gait velocity interpretation: <1.31 ft/sec, indicative of household ambulator General Gait  Details: pt with short steps, reduced foot clearance bilaterally. Marching in places for 10 steps and then another 8 in second session, side stepping toward head of bed   Stairs             Wheelchair Mobility    Modified Rankin (Stroke Patients Only)       Balance Overall balance assessment: Needs assistance Sitting-balance support: No upper extremity supported;Feet supported Sitting balance-Leahy Scale: Fair     Standing balance support: Bilateral upper extremity supported Standing balance-Leahy Scale: Poor Standing balance comment: reliant on BUE support of RW                            Cognition Arousal/Alertness: Awake/alert Behavior During Therapy: WFL for tasks assessed/performed Overall Cognitive Status: Within Functional Limits for tasks assessed                                        Exercises General Exercises - Lower Extremity Ankle Circles/Pumps: AROM;Both;10 reps Gluteal Sets: AROM;Both;5 reps Straight Leg Raises: AROM;Both;5 reps    General Comments General comments (skin integrity, edema, etc.): VSS on RA      Pertinent Vitals/Pain Pain Assessment: Faces Faces Pain Scale: Hurts a little bit Pain Location: generalized discomfort Pain Descriptors / Indicators: Discomfort Pain Intervention(s): Monitored during session    Home Living                      Prior Function            PT Goals (current goals can now be  found in the care plan section) Acute Rehab PT Goals Patient Stated Goal: none stated Progress towards PT goals: Progressing toward goals (slowly, fatigues quickly)    Frequency    Min 3X/week      PT Plan Current plan remains appropriate    Co-evaluation              AM-PAC PT "6 Clicks" Mobility   Outcome Measure  Help needed turning from your back to your side while in a flat bed without using bedrails?: A Little Help needed moving from lying on your back to sitting on  the side of a flat bed without using bedrails?: A Little Help needed moving to and from a bed to a chair (including a wheelchair)?: A Little Help needed standing up from a chair using your arms (e.g., wheelchair or bedside chair)?: A Little Help needed to walk in hospital room?: A Lot Help needed climbing 3-5 steps with a railing? : A Lot 6 Click Score: 16    End of Session   Activity Tolerance: Patient limited by fatigue Patient left: in bed;with call bell/phone within reach;with bed alarm set Nurse Communication: Mobility status PT Visit Diagnosis: Other abnormalities of gait and mobility (R26.89);Difficulty in walking, not elsewhere classified (R26.2)     Time: 7530-1040 PT Time Calculation (min) (ACUTE ONLY): 17 min  Charges:  $Therapeutic Activity: 8-22 mins                     Zenaida Niece, PT, DPT Acute Rehabilitation Pager: [phone]    Zenaida Niece 08/01/2020, 4:18 PM

## 2020-08-01 NOTE — Progress Notes (Signed)
[facility] Progress Note  Patient: [name]    IRC:[id number]  [place]: 07/16/2020     Date of Service: the patient was seen and examined on 08/01/2020  Brief hospital course: Past medical history of ongoing alcohol abuse, fatty liver disease, HTN, GERD, schizoaffective disorder. Found to have possible seizures. Patient was intubated in the ER for airway protection. Treated for alcoholic hepatitis of the ICU. GI was consulted. Also metabolic encephalopathy. After extubation was transferred out of the ICU. 11/29 Admission. MEDDRY SCORE 34 and started solumedrol, GI consulted 12/1 PICC line placement 12/2 extubated, reintubated. 12/3 Disconnected self from vent multiple times overnight due to agitation/confusion, MELD score - 27 points  12/4 - dense left sided pneumonia. 12/8 extubated on 2 LPM  Currently plan is monitor for stability and LFTs and Hb and arrange for SNF transfer.  Assessment and Plan: 1. Decompensated alcoholic liver cirrhosis Portal hypertension Acute hepatic encephalopathy Alcoholic hepatitis Presented with seizure-like event. Required ICU admission and intubation. Started on Solu-Medrol, lactulose. GI was consulted. Prednisone was discontinued as [name] score was suggestive of poor response. GI signed off recommending no further work-up or intervention. Prior provider discussed with GI due to elevated total bilirubin and patient was started on NAC protocol. Currently bilirubin improving.  2. Ascites Patient appears to be having dense ascites as well as anasarca. Likely from hypoalbuminemia. We will consider initiation of Lasix Aldactone.  3. Alcohol abuse Marijuana abuse UDS positive for benzos as well as THC. EEG negative for seizures. Currently beyond withdrawal.. Continue thiamine folic acid and multivitamin. Not a candidate for liver transplant for now, given ongoing alcohol abuse. Currently patient resolved to quit alcohol completely.  4.  Essential hypertension Blood pressure currently actually soft. On midodrine. We'll monitor.  5. Schizoaffective disorder Currently stable with On Seroquel and Zoloft.  6. Acute hypoxic respiratory failure requiring intubation, resolved., POA. Monitor.  7. Hypokalemia. Currently being replaced.  8. Obesity Contributing to patient's alcoholic liver disease.  Body mass index is 31.93 kg/m.  Nutrition Problem: Increased nutrient needs Etiology: catabolic illness Interventions: Interventions: Prostat,MVI,Tube feeding  Pressure Injury 07/16/20 Buttocks Medial Stage 1 -  Intact skin with non-blanchable redness of a localized area usually over a bony prominence. (Active)  07/16/20 2000  Location: Buttocks  Location Orientation: Medial  Staging: Stage 1 -  Intact skin with non-blanchable redness of a localized area usually over a bony prominence.  Wound Description (Comments):   Present on Admission: Yes     Diet: Low-sodium diet DVT Prophylaxis:   heparin injection 5,000 Units Start: 07/16/20 2200 SCDs Start: 07/16/20 1739    Advance goals of care discussion: Full code  Family Communication: no family was present at bedside, at the time of interview.   Disposition:  Status is: Inpatient  Remains inpatient appropriate because:IV treatments appropriate due to intensity of illness or inability to take PO and Inpatient level of care appropriate due to severity of illness   Dispo: The patient is from: Home              Anticipated d/c is to: SNF              Anticipated d/c date is: 3 days              Patient currently is not medically stable to d/c.        Subjective: No nausea no vomiting. No fever no chills. Reports abdominal distention. No trouble breathing.  Physical Exam:  General: Appear in mild distress,  no Rash; Oral Mucosa Clear, moist. no Abnormal Neck Mass Or lumps, Conjunctiva normal  Cardiovascular: S1 and S2 Present, no Murmur, Respiratory: good  respiratory effort, Bilateral Air entry present and CTA, no Crackles, no wheezes Abdomen: Bowel Sound present, Soft and distended, no tenderness Extremities: bilateral  Pedal edema Neurology: alert and oriented to time, place, and person affect appropriate. no new focal deficit, no asterixis Gait not checked due to patient safety concerns  Vitals:   08/01/20 0331 08/01/20 0843 08/01/20 1224 08/01/20 1729  BP: 105/61 119/73 119/84 113/77  Pulse: 98 93 (!) 104 87  Resp: 17 16 18 18   Temp: 98 F (36.7 C) 97.6 F (36.4 C) 98.1 F (36.7 C) 97.7 F (36.5 C)  TempSrc:  Oral Oral Oral  SpO2: 98% 99% 100% 99%  Weight:      Height:        Intake/Output Summary (Last 24 hours) at 08/01/2020 1813 Last data filed at 08/01/2020 1300 Gross per 24 hour  Intake 740 ml  Output 600 ml  Net 140 ml   Filed Weights   07/25/20 0500 07/28/20 0502 07/29/20 0500  Weight: 100.5 kg 105.6 kg 106.8 kg    Data Reviewed: I have personally reviewed and interpreted daily labs, tele strips, imagings as discussed above. I reviewed all nursing notes, pharmacy notes, vitals, pertinent old records I have discussed plan of care as described above with RN and patient/family.  CBC: Recent Labs  Lab 07/28/20 0443 07/29/20 0123 07/30/20 0457 07/31/20 0445 08/01/20 0437  WBC 19.3* 18.7* 19.1* 16.9* 13.2*  HGB 6.8* 7.8* 8.4* 7.8* 7.8*  HCT 20.8* 24.7* 27.0* 24.7* 24.9*  MCV 103.5* 100.0 102.7* 103.3* 102.9*  PLT 143* 135* 157 151 960*   Basic Metabolic Panel: Recent Labs  Lab 07/28/20 0443 07/29/20 0123 07/30/20 0457 07/31/20 0445 08/01/20 0437  NA 143 144 141 140 140  K 3.5 3.6 3.6 3.7 3.1*  CL 116* 117* 113* 114* 111  CO2 19* 18* 18* 18* 16*  GLUCOSE 124* 116* 145* 129* 107*  BUN 25* 23* 21* 20 20  CREATININE 0.89 0.86 0.79 0.86 0.95  CALCIUM 8.3* 8.3* 8.5* 8.5* 8.4*  MG 2.2 2.2 2.2 2.2 2.1    Studies: No results found.  Scheduled Meds: . B-complex with vitamin C  1 tablet Oral Daily   . chlorhexidine  15 mL Mouth Rinse BID  . Chlorhexidine Gluconate Cloth  6 each Topical Daily  . cholecalciferol  2,000 Units Oral Daily  . feeding supplement  237 mL Oral TID BM  . folic acid  1 mg Oral Daily  . heparin injection (subcutaneous)  5,000 Units Subcutaneous Q8H  . lactulose  20 g Oral TID  . mouth rinse  15 mL Mouth Rinse q12n4p  . midodrine  10 mg Oral TID WC  . multivitamin with minerals  1 tablet Oral Daily  . pantoprazole  40 mg Oral BID  . QUEtiapine  50 mg Oral QHS  . rifaximin  550 mg Oral BID  . sertraline  25 mg Oral Daily  . sodium chloride flush  10-40 mL Intracatheter Q12H  . thiamine  100 mg Oral Daily  . vitamin A  10,000 Units Oral Daily  . vitamin C  250 mg Oral BID   Continuous Infusions: . sodium chloride Stopped (07/17/20 1958)  . acetylcysteine 6.25 mg/kg/hr (07/31/20 2157)   PRN Meds: sodium chloride, alum & mag hydroxide-simeth, dextromethorphan-guaiFENesin, docusate sodium, ibuprofen, ipratropium-albuterol, polyethylene glycol, Resource ThickenUp Clear, sodium chloride flush  Time spent: 35 minutes  Author: Berle Mull, MD [facility] 08/01/2020 6:13 PM  To reach On-call, see care teams to locate the attending and reach out via [email or web address]. Between 7PM-7AM, please contact night-coverage If you still have difficulty reaching the attending provider, please page the [facility] (Director on Call) for [facility] on amion for assistance.

## 2020-08-01 NOTE — Progress Notes (Signed)
Patient ID: [name], male   DOB: 1971-01-16, 49 y.o.   MRN: [id number]  This NP visited patient at the bedside as a follow up for palliative medicine needs and emotional support.   49 y.o. male   admitted on 07/16/2020 with  past medical history notable for EtOH use disorder,hepatic steatosis,essential hypertension, GERD, schizoaffective disorder, PTSD,  presents after found down at home for concern for seizures, patient intubated in ED for for airway protection due to depressed consciousness.  Education offered to patient on the seriousness of his current medical situation and is high risk for decompensation.  Patient has stabilized and was hopeful for inpatient rehabilitation however he was not cleared by his insurance, [facility] team exploring other options for rehabilitation, recommend outpatient community-based palliative services at facility.  Discussed with patient the importance of continued conversation with his family and their  medical providers regarding overall plan of care and treatment options,  ensuring decisions are within the context of the patients values and GOCs.  Questions and concerns addressed    Left message with sister for update, await callback.    Total time spent on the unit was 20 minutes  Greater than 50% of the time was spent in counseling and coordination of care  Wadie Lessen NP  Palliative Medicine Team Team Phone # [phone] Pager [phone]

## 2020-08-02 LAB — COMPREHENSIVE METABOLIC PANEL
ALT: 174 U/L — ABNORMAL HIGH (ref 0–44)
AST: 186 U/L — ABNORMAL HIGH (ref 15–41)
Albumin: 1.5 g/dL — ABNORMAL LOW (ref 3.5–5.0)
Alkaline Phosphatase: 218 U/L — ABNORMAL HIGH (ref 38–126)
Anion gap: 13 (ref 5–15)
BUN: 18 mg/dL (ref 6–20)
CO2: 15 mmol/L — ABNORMAL LOW (ref 22–32)
Calcium: 8.5 mg/dL — ABNORMAL LOW (ref 8.9–10.3)
Chloride: 110 mmol/L (ref 98–111)
Creatinine, Ser: 0.92 mg/dL (ref 0.61–1.24)
GFR, Estimated: >60 mL/min (ref >60)
Glucose, Bld: 104 mg/dL — ABNORMAL HIGH (ref 70–99)
Potassium: 3.2 mmol/L — ABNORMAL LOW (ref 3.5–5.1)
Sodium: 138 mmol/L (ref 135–145)
Total Bilirubin: 27.7 mg/dL (ref 0.3–1.2)
Total Protein: 4.8 g/dL — ABNORMAL LOW (ref 6.5–8.1)

## 2020-08-02 LAB — CBC
HCT: 24 % — ABNORMAL LOW (ref 39.0–52.0)
Hemoglobin: 7.9 g/dL — ABNORMAL LOW (ref 13.0–17.0)
MCH: 33.6 pg (ref 26.0–34.0)
MCHC: 32.9 g/dL (ref 30.0–36.0)
MCV: 102.1 fL — ABNORMAL HIGH (ref 80.0–100.0)
Platelets: 145 10*3/uL — ABNORMAL LOW (ref 150–400)
RBC: 2.35 MIL/uL — ABNORMAL LOW (ref 4.22–5.81)
WBC: 13.2 10*3/uL — ABNORMAL HIGH (ref 4.0–10.5)
nRBC: 0 % (ref 0.0–0.2)

## 2020-08-02 LAB — PROTIME-INR
INR: 1.5 — ABNORMAL HIGH (ref 0.8–1.2)
Prothrombin Time: 17.1 seconds — ABNORMAL HIGH (ref 11.4–15.2)

## 2020-08-02 LAB — MAGNESIUM: Magnesium: 2 mg/dL (ref 1.7–2.4)

## 2020-08-02 LAB — AMMONIA: Ammonia: 40 umol/L — ABNORMAL HIGH (ref 9–35)

## 2020-08-02 MED ORDER — PHENOL 1.4 % MT LIQD
2.0000 | OROMUCOSAL | Status: DC | PRN
  Administered 2020-08-02: 2 via OROMUCOSAL
  Filled 2020-08-02: qty 177

## 2020-08-02 MED ORDER — FUROSEMIDE 20 MG PO TABS
20.0000 mg | ORAL_TABLET | Freq: Every day | ORAL | Status: DC
  Administered 2020-08-02 – 2020-08-03 (×2): 20 mg via ORAL
  Filled 2020-08-02 (×2): qty 1

## 2020-08-02 MED ORDER — SPIRONOLACTONE 25 MG PO TABS
50.0000 mg | ORAL_TABLET | Freq: Every day | ORAL | Status: DC
  Administered 2020-08-02 – 2020-08-04 (×3): 50 mg via ORAL
  Filled 2020-08-02 (×3): qty 2

## 2020-08-02 MED ORDER — POTASSIUM CHLORIDE CRYS ER 20 MEQ PO TBCR
40.0000 meq | EXTENDED_RELEASE_TABLET | ORAL | Status: AC
Start: 2020-08-02 — End: 2020-08-02
  Administered 2020-08-02 (×2): 40 meq via ORAL
  Filled 2020-08-02 (×2): qty 2

## 2020-08-02 NOTE — Progress Notes (Signed)
[facility] Progress Note  Patient: [name]    GDJ:[id number]  [place]: 07/16/2020     Date of Service: the patient was seen and examined on 08/02/2020  Brief hospital course: Past medical history of ongoing alcohol abuse, fatty liver disease, HTN, GERD, schizoaffective disorder. Found to have possible seizures. Patient was intubated in the ER for airway protection. Treated for alcoholic hepatitis of the ICU. GI was consulted. Also metabolic encephalopathy. After extubation was transferred out of the ICU. 11/29 Admission. MEDDRY SCORE 34 and started solumedrol, GI consulted 12/1 PICC line placement 12/2 extubated, reintubated. 12/3 Disconnected self from vent multiple times overnight due to agitation/confusion, MELD score - 27 points  12/4 - dense left sided pneumonia. 12/5 [place] score, day 7 0.9-likely nonresponder to steroid 12/8 extubated on 2 LPM 12/16, MELD score 24 Currently plan is treat volume overload and encephalopathy.  Assessment and Plan: 1. Decompensated alcoholic liver cirrhosis Portal hypertension, esophageal varices Acute hepatic encephalopathy Alcoholic hepatitis Presented with seizure-like event. Required ICU admission and intubation. Started on Solu-Medrol, lactulose. GI was consulted. Prednisolone was discontinued as [name] score was suggestive of poor response. GI signed off recommending no further work-up or intervention. Prior provider discussed with GI due to elevated total bilirubin and patient was started on NAC protocol. Currently on day 3 of NAC protocol, last day 07/1719 21. Currently bilirubin improving.  2. Ascites Patient appears to be having dense ascites as well as anasarca. Likely from hypoalbuminemia. We will initiate Lasix Aldactone.  Monitor blood pressure. Support blood pressure with IV albumin and midodrine.  3. Alcohol abuse Marijuana abuse UDS positive for benzos as well as THC. EEG negative for seizures. Currently beyond  withdrawal.. Continue thiamine folic acid and multivitamin. Not a candidate for liver transplant for now, given ongoing alcohol abuse. Currently patient resolved to quit alcohol completely.  4. Essential hypertension Blood pressure currently actually soft. On midodrine. We'll monitor.  5. Schizoaffective disorder Currently stable with On Seroquel and Zoloft.  6. Acute hypoxic respiratory failure requiring intubation, resolved., POA. Monitor.  7. Hypokalemia. Currently being replaced.  8. Obesity Contributing to patient's alcoholic liver disease.  Body mass index is 31.93 kg/m.  Nutrition Problem: Increased nutrient needs Etiology: catabolic illness Interventions: Interventions: Prostat,MVI,Tube feeding  Body mass index is 33.91 kg/m.  Nutrition Problem: Increased nutrient needs Etiology: catabolic illness Interventions: Interventions: Prostat,MVI,Tube feeding  Pressure Injury 07/16/20 Buttocks Medial Stage 1 -  Intact skin with non-blanchable redness of a localized area usually over a bony prominence. (Active)  07/16/20 2000  Location: Buttocks  Location Orientation: Medial  Staging: Stage 1 -  Intact skin with non-blanchable redness of a localized area usually over a bony prominence.  Wound Description (Comments):   Present on Admission: Yes    Diet: Low-sodium diet DVT Prophylaxis:   heparin injection 5,000 Units Start: 07/16/20 2200 SCDs Start: 07/16/20 1739    Advance goals of care discussion: Full code  Family Communication: no family was present at bedside, at the time of interview.  The pt provided permission to discuss medical plan with the family.  Discussed with family on the phone.  Opportunity was given to ask question and all questions were answered satisfactorily.   Disposition:  Status is: Inpatient  Remains inpatient appropriate because:IV treatments appropriate due to intensity of illness or inability to take PO and Inpatient level of  care appropriate due to severity of illness   Dispo: The patient is from: Home  Anticipated d/c is to: SNF              Anticipated d/c date is: 3 days              Patient currently is not medically stable to d/c.  Subjective: Reports abdominal distention.  No nausea no vomiting.  No fever no chills.  No chest pain.  Also fatigue and tiredness.  Physical Exam:  General: Appear in mild distress, no Rash; Oral Mucosa Clear, moist. no Abnormal Neck Mass Or lumps, Conjunctiva normal  Cardiovascular: S1 and S2 Present, no Murmur, Respiratory: good respiratory effort, Bilateral Air entry present and CTA, no Crackles, no wheezes Abdomen: Bowel Sound present, Soft and distended, no tenderness Extremities: bilateral  Pedal edema Neurology: alert and oriented to time, place, and person affect appropriate. no new focal deficit faint asterixis present. Gait not checked due to patient safety concerns  Vitals:   08/02/20 0500 08/02/20 0758 08/02/20 1142 08/02/20 1643  BP:  119/72 122/79 127/75  Pulse:  98 80 81  Resp:  16 16 18   Temp:  97.8 F (36.6 C) (!) 97.5 F (36.4 C) 97.6 F (36.4 C)  TempSrc:  Oral Oral Oral  SpO2:  98% 100% 100%  Weight: 113.4 kg     Height:        Intake/Output Summary (Last 24 hours) at 08/02/2020 1855 Last data filed at 08/02/2020 1450 Gross per 24 hour  Intake 240.4 ml  Output 800 ml  Net -559.6 ml   Filed Weights   07/28/20 0502 07/29/20 0500 08/02/20 0500  Weight: 105.6 kg 106.8 kg 113.4 kg    Data Reviewed: I have personally reviewed and interpreted daily labs, tele strips, imagings as discussed above. I reviewed all nursing notes, pharmacy notes, vitals, pertinent old records I have discussed plan of care as described above with RN and patient/family.  CBC: Recent Labs  Lab 07/29/20 0123 07/30/20 0457 07/31/20 0445 08/01/20 0437 08/02/20 0333  WBC 18.7* 19.1* 16.9* 13.2* 13.2*  HGB 7.8* 8.4* 7.8* 7.8* 7.9*  HCT 24.7* 27.0*  24.7* 24.9* 24.0*  MCV 100.0 102.7* 103.3* 102.9* 102.1*  PLT 135* 157 151 144* 858*   Basic Metabolic Panel: Recent Labs  Lab 07/29/20 0123 07/30/20 0457 07/31/20 0445 08/01/20 0437 08/02/20 0333  NA 144 141 140 140 138  K 3.6 3.6 3.7 3.1* 3.2*  CL 117* 113* 114* 111 110  CO2 18* 18* 18* 16* 15*  GLUCOSE 116* 145* 129* 107* 104*  BUN 23* 21* 20 20 18   CREATININE 0.86 0.79 0.86 0.95 0.92  CALCIUM 8.3* 8.5* 8.5* 8.4* 8.5*  MG 2.2 2.2 2.2 2.1 2.0    Studies: No results found.  Scheduled Meds: . B-complex with vitamin C  1 tablet Oral Daily  . chlorhexidine  15 mL Mouth Rinse BID  . Chlorhexidine Gluconate Cloth  6 each Topical Daily  . cholecalciferol  2,000 Units Oral Daily  . feeding supplement  237 mL Oral TID BM  . folic acid  1 mg Oral Daily  . furosemide  20 mg Oral Daily  . heparin injection (subcutaneous)  5,000 Units Subcutaneous Q8H  . lactulose  20 g Oral TID  . mouth rinse  15 mL Mouth Rinse q12n4p  . midodrine  10 mg Oral TID WC  . multivitamin with minerals  1 tablet Oral Daily  . pantoprazole  40 mg Oral BID  . QUEtiapine  50 mg Oral QHS  . rifaximin  550 mg Oral BID  .  sertraline  25 mg Oral Daily  . sodium chloride flush  10-40 mL Intracatheter Q12H  . spironolactone  50 mg Oral Daily  . thiamine  100 mg Oral Daily  . vitamin A  10,000 Units Oral Daily  . vitamin C  250 mg Oral BID   Continuous Infusions: . sodium chloride Stopped (07/17/20 1958)  . acetylcysteine 6.25 mg/kg/hr (08/02/20 1334)   PRN Meds: sodium chloride, alum & mag hydroxide-simeth, dextromethorphan-guaiFENesin, docusate sodium, ipratropium-albuterol, phenol, polyethylene glycol, Resource ThickenUp Clear, sodium chloride flush  Time spent: 35 minutes  Author: Berle Mull, MD [facility] 08/02/2020 6:55 PM  To reach On-call, see care teams to locate the attending and reach out via [email or web address]. Between 7PM-7AM, please contact night-coverage If you still have  difficulty reaching the attending provider, please page the [facility] (Director on Call) for [facility] on amion for assistance.

## 2020-08-02 NOTE — Progress Notes (Signed)
Occupational Therapy Treatment Patient Details Name: [name] MRN: [id number] DOB: [birth year] Today's Date: 08/02/2020    History of present illness 49 y.o. male admitted with AMS with encephalopathy and alcholic hepatitis. Intubated in ED on 11/29, extubated and reintubated 12/2. PMHx: alcoholism, HTN, GERD, schizoaffective disorder   OT comments  Patient met lying supine in bed in agreement with OT treatment session with focus on self-care re-education, bed mobility, OOB activity tolerance, and functional transfers. Patient found to be incontinent of bowel requiring Total A for hygiene/clothing management at bed level. Patient continues to require increased coaxing/encouragment for participation with therapy efforts and decreased activity tolerance. Patient also with redness on multiple areas of buttocks. Barrier cream applied and RN made aware. Session concluded with patient seated in recliner with call bell within reach, chair alarm activated, and all needs met. Patient would benefit from continued acute OT services given CLOF. Recommendation updated to SNF rehab.    Follow Up Recommendations  SNF    Equipment Recommendations  3 in 1 bedside commode    Recommendations for Other Services      Precautions / Restrictions Precautions Precautions: Fall Restrictions Weight Bearing Restrictions: No       Mobility Bed Mobility Overal bed mobility: Needs Assistance Bed Mobility: Rolling;Sidelying to Sit Rolling: Min assist Sidelying to sit: Min assist       General bed mobility comments: Min A at trunk with increased time/effort and use of rail 2/2 fatigue.  Transfers Overall transfer level: Needs assistance Equipment used: Rolling walker (2 wheeled) Transfers: Sit to/from [facility] Sit to Stand: Min assist;From elevated surface Stand pivot transfers: Min assist       General transfer comment: Min A for sit to stand from elevated EOB and for  stand-pivot to recliner on L.    Balance Overall balance assessment: Needs assistance Sitting-balance support: No upper extremity supported;Feet supported Sitting balance-Leahy Scale: Fair     Standing balance support: Bilateral upper extremity supported Standing balance-Leahy Scale: Poor Standing balance comment: reliant on BUE support of RW                           ADL either performed or assessed with clinical judgement   ADL                                               Vision       Perception     Praxis      Cognition Arousal/Alertness: Awake/alert Behavior During Therapy: WFL for tasks assessed/performed Overall Cognitive Status: Within Functional Limits for tasks assessed                                          Exercises     Shoulder Instructions       General Comments      Pertinent Vitals/ Pain       Pain Assessment: 0-10 Pain Score: 3  Pain Location: Abdomen Pain Descriptors / Indicators: Discomfort Pain Intervention(s): Monitored during session;Repositioned  Home Living  Prior Functioning/Environment              Frequency  Min 2X/week        Progress Toward Goals  OT Goals(current goals can now be found in the care plan section)  Progress towards OT goals: Progressing toward goals  Acute Rehab OT Goals Patient Stated Goal: To get stronger. OT Goal Formulation: With patient Time For Goal Achievement: 08/07/20 Potential to Achieve Goals: Good ADL Goals Pt Will Perform Grooming: with supervision;sitting Pt Will Perform Upper Body Dressing: with supervision;sitting Pt Will Transfer to Toilet: with min assist;stand pivot transfer Pt Will Perform Toileting - Clothing Manipulation and hygiene: with min assist;sit to/from stand Pt/caregiver will Perform Home Exercise Program: Increased strength;Both right and left upper  extremity;With written HEP provided;With Supervision Additional ADL Goal #1: Pt will follow multi-step commands with 75% accuracy during ADL/functional task.  Plan Discharge plan needs to be updated    Co-evaluation                 AM-PAC OT "6 Clicks" Daily Activity     Outcome Measure   Help from another person eating meals?: A Little Help from another person taking care of personal grooming?: A Little Help from another person toileting, which includes using toliet, bedpan, or urinal?: Total Help from another person bathing (including washing, rinsing, drying)?: Total Help from another person to put on and taking off regular upper body clothing?: A Little Help from another person to put on and taking off regular lower body clothing?: Total 6 Click Score: 12    End of Session Equipment Utilized During Treatment: Gait belt  OT Visit Diagnosis: Unsteadiness on feet (R26.81);Muscle weakness (generalized) (M62.81);Other symptoms and signs involving cognitive function   Activity Tolerance Patient tolerated treatment well   Patient Left in chair;with call bell/phone within reach;with chair alarm set   Nurse Communication          Time: [phone] OT Time Calculation (min): 35 min  Charges: OT General Charges $OT Visit: 1 Visit OT Treatments $Self Care/Home Management : 23-37 mins  Brinna Divelbiss H. OTR/L Supplemental OT, Department of rehab services [phone]   Denvil Canning R H. 08/02/2020, 2:58 PM

## 2020-08-03 LAB — COMPREHENSIVE METABOLIC PANEL
ALT: 170 U/L — ABNORMAL HIGH (ref 0–44)
AST: 162 U/L — ABNORMAL HIGH (ref 15–41)
Albumin: 1.6 g/dL — ABNORMAL LOW (ref 3.5–5.0)
Alkaline Phosphatase: 244 U/L — ABNORMAL HIGH (ref 38–126)
Anion gap: 15 (ref 5–15)
BUN: 17 mg/dL (ref 6–20)
CO2: 15 mmol/L — ABNORMAL LOW (ref 22–32)
Calcium: 8.5 mg/dL — ABNORMAL LOW (ref 8.9–10.3)
Chloride: 108 mmol/L (ref 98–111)
Creatinine, Ser: 1.1 mg/dL (ref 0.61–1.24)
GFR, Estimated: >60 mL/min (ref >60)
Glucose, Bld: 145 mg/dL — ABNORMAL HIGH (ref 70–99)
Potassium: 3.3 mmol/L — ABNORMAL LOW (ref 3.5–5.1)
Sodium: 138 mmol/L (ref 135–145)
Total Bilirubin: 26.9 mg/dL (ref 0.3–1.2)
Total Protein: 5.2 g/dL — ABNORMAL LOW (ref 6.5–8.1)

## 2020-08-03 LAB — CBC
HCT: 23.8 % — ABNORMAL LOW (ref 39.0–52.0)
Hemoglobin: 8 g/dL — ABNORMAL LOW (ref 13.0–17.0)
MCH: 34.2 pg — ABNORMAL HIGH (ref 26.0–34.0)
MCHC: 33.6 g/dL (ref 30.0–36.0)
MCV: 101.7 fL — ABNORMAL HIGH (ref 80.0–100.0)
Platelets: 163 10*3/uL (ref 150–400)
RBC: 2.34 MIL/uL — ABNORMAL LOW (ref 4.22–5.81)
WBC: 13.5 10*3/uL — ABNORMAL HIGH (ref 4.0–10.5)
nRBC: 0 % (ref 0.0–0.2)

## 2020-08-03 MED ORDER — POTASSIUM CHLORIDE CRYS ER 20 MEQ PO TBCR
40.0000 meq | EXTENDED_RELEASE_TABLET | Freq: Once | ORAL | Status: AC
  Administered 2020-08-03: 40 meq via ORAL
  Filled 2020-08-03: qty 2

## 2020-08-03 NOTE — Progress Notes (Signed)
[facility] Progress Note  Patient: [name]    SEL:[id number]  [place]: 07/16/2020     Date of Service: the patient was seen and examined on 08/03/2020  Brief hospital course: Past medical history of ongoing alcohol abuse, fatty liver disease, HTN, GERD, schizoaffective disorder. Found to have possible seizures. Patient was intubated in the ER for airway protection. Treated for alcoholic hepatitis of the ICU. GI was consulted. Also metabolic encephalopathy. After extubation was transferred out of the ICU. 11/29 Admission. MEDDRY SCORE 34 and started solumedrol, GI consulted 12/1 PICC line placement 12/2 extubated, reintubated. 12/3 Disconnected self from vent multiple times overnight due to agitation/confusion, MELD score - 27 points  12/4 - dense left sided pneumonia. 12/8 extubated on 2 LPM  Currently plan is monitor for stability and LFTs and Hb and arrange for SNF transfer.  Assessment and Plan: 1. Decompensated alcoholic liver cirrhosis Portal hypertension Acute hepatic encephalopathy Alcoholic hepatitis Presented with seizure-like event. Required ICU admission and intubation. Started on Solu-Medrol, lactulose. GI was consulted. Prednisone was discontinued as [name] score was suggestive of poor response. GI signed off recommending no further work-up or intervention. Prior provider discussed with GI due to elevated total bilirubin and patient was started on NAC protocol. Currently bilirubin improving.  2. Ascites Patient appears to be having dense ascites as well as anasarca. Likely from hypoalbuminemia. Continue Lasix Aldactone.  If blood pressure remains stable will increase the dose tomorrow.  3. Alcohol abuse Marijuana abuse UDS positive for benzos as well as THC. EEG negative for seizures. Currently beyond withdrawal.. Continue thiamine folic acid and multivitamin. Not a candidate for liver transplant for now, given ongoing alcohol abuse. Currently patient  resolved to quit alcohol completely.  4. Essential hypertension Blood pressure currently actually soft. On midodrine. We'll monitor.  5. Schizoaffective disorder Currently stable with On Seroquel and Zoloft.  6. Acute hypoxic respiratory failure requiring intubation, resolved., POA. Monitor.  7. Hypokalemia. Currently being replaced.  8. Obesity Contributing to patient's alcoholic liver disease.  Body mass index is 34 kg/m.  Nutrition Problem: Increased nutrient needs Etiology: catabolic illness Interventions: Interventions: Prostat,MVI,Tube feeding  Pressure Injury 07/16/20 Buttocks Medial Stage 1 -  Intact skin with non-blanchable redness of a localized area usually over a bony prominence. (Active)  07/16/20 2000  Location: Buttocks  Location Orientation: Medial  Staging: Stage 1 -  Intact skin with non-blanchable redness of a localized area usually over a bony prominence.  Wound Description (Comments):   Present on Admission: Yes     Diet: Low-sodium diet DVT Prophylaxis:   heparin injection 5,000 Units Start: 07/16/20 2200 SCDs Start: 07/16/20 1739    Advance goals of care discussion: Full code explain patient's prognosis regarding his meld score  Family Communication: no family was present at bedside, at the time of interview.  Discussed with family on 12/16.  Disposition:  Status is: Inpatient  Remains inpatient appropriate because:IV treatments appropriate due to intensity of illness or inability to take PO and Inpatient level of care appropriate due to severity of illness  Dispo: The patient is from: Home              Anticipated d/c is to: SNF              Anticipated d/c date is: 3 days              Patient currently is not medically stable to d/c.  Subjective: No nausea no vomiting.  No fever no chills.  No  chest pain.  Abdominal pain.  Physical Exam:  General: Appear in mild distress, no Rash; Oral Mucosa Clear, moist. no Abnormal Neck Mass Or  lumps, Conjunctiva normal  Cardiovascular: S1 and S2 Present, no Murmur, Respiratory: good respiratory effort, Bilateral Air entry present and CTA, no Crackles, no wheezes Abdomen: Bowel Sound present, Soft and distended, no tenderness Extremities: bilateral  Pedal edema Neurology: alert and oriented to time, place, and person affect appropriate. no new focal deficit, no asterixis Gait not checked due to patient safety concerns  Vitals:   08/03/20 0448 08/03/20 0829 08/03/20 1214 08/03/20 1620  BP: 114/82 113/75 134/82 120/83  Pulse: 91 92 91 88  Resp: 18 16 18 17   Temp: 97.6 F (36.4 C) (!) 97.5 F (36.4 C) (!) 97.5 F (36.4 C) 98 F (36.7 C)  TempSrc: Oral Oral Oral Oral  SpO2: 100% 100% 100% 100%  Weight: 113.7 kg     Height:        Intake/Output Summary (Last 24 hours) at 08/03/2020 1955 Last data filed at 08/03/2020 1623 Gross per 24 hour  Intake 1575 ml  Output 925 ml  Net 650 ml   Filed Weights   07/29/20 0500 08/02/20 0500 08/03/20 0448  Weight: 106.8 kg 113.4 kg 113.7 kg    Data Reviewed: I have personally reviewed and interpreted daily labs, tele strips, imagings as discussed above. I reviewed all nursing notes, pharmacy notes, vitals, pertinent old records I have discussed plan of care as described above with RN and patient/family.  CBC: Recent Labs  Lab 07/30/20 0457 07/31/20 0445 08/01/20 0437 08/02/20 0333 08/03/20 0450  WBC 19.1* 16.9* 13.2* 13.2* 13.5*  HGB 8.4* 7.8* 7.8* 7.9* 8.0*  HCT 27.0* 24.7* 24.9* 24.0* 23.8*  MCV 102.7* 103.3* 102.9* 102.1* 101.7*  PLT 157 151 144* 145* 092   Basic Metabolic Panel: Recent Labs  Lab 07/29/20 0123 07/30/20 0457 07/31/20 0445 08/01/20 0437 08/02/20 0333 08/03/20 0450  NA 144 141 140 140 138 138  K 3.6 3.6 3.7 3.1* 3.2* 3.3*  CL 117* 113* 114* 111 110 108  CO2 18* 18* 18* 16* 15* 15*  GLUCOSE 116* 145* 129* 107* 104* 145*  BUN 23* 21* 20 20 18 17   CREATININE 0.86 0.79 0.86 0.95 0.92 1.10   CALCIUM 8.3* 8.5* 8.5* 8.4* 8.5* 8.5*  MG 2.2 2.2 2.2 2.1 2.0  --     Studies: No results found.  Scheduled Meds: . B-complex with vitamin C  1 tablet Oral Daily  . chlorhexidine  15 mL Mouth Rinse BID  . Chlorhexidine Gluconate Cloth  6 each Topical Daily  . cholecalciferol  2,000 Units Oral Daily  . feeding supplement  237 mL Oral TID BM  . folic acid  1 mg Oral Daily  . furosemide  20 mg Oral Daily  . heparin injection (subcutaneous)  5,000 Units Subcutaneous Q8H  . lactulose  20 g Oral TID  . mouth rinse  15 mL Mouth Rinse q12n4p  . midodrine  10 mg Oral TID WC  . multivitamin with minerals  1 tablet Oral Daily  . pantoprazole  40 mg Oral BID  . QUEtiapine  50 mg Oral QHS  . rifaximin  550 mg Oral BID  . sertraline  25 mg Oral Daily  . sodium chloride flush  10-40 mL Intracatheter Q12H  . spironolactone  50 mg Oral Daily  . thiamine  100 mg Oral Daily  . vitamin A  10,000 Units Oral Daily  . vitamin C  250 mg Oral BID   Continuous Infusions: . sodium chloride Stopped (07/17/20 1958)   PRN Meds: sodium chloride, alum & mag hydroxide-simeth, dextromethorphan-guaiFENesin, docusate sodium, ipratropium-albuterol, phenol, polyethylene glycol, Resource ThickenUp Clear, sodium chloride flush  Time spent: 35 minutes  Author: Berle Mull, MD [facility] 08/03/2020 7:55 PM  To reach On-call, see care teams to locate the attending and reach out via [email or web address]. Between 7PM-7AM, please contact night-coverage If you still have difficulty reaching the attending provider, please page the [facility] (Director on Call) for [facility] on amion for assistance.

## 2020-08-03 NOTE — Progress Notes (Signed)
Physical Therapy Treatment Patient Details Name: [name] MRN: [id number] DOB: 1970-12-22 Today's Date: 08/03/2020    History of Present Illness 49 y.o. male admitted with AMS with encephalopathy and alcholic hepatitis. Intubated in ED on 11/29, extubated and reintubated 12/2. PMHx: alcoholism, HTN, GERD, schizoaffective disorder    PT Comments    Pt remains limited by fatigue but is able to tolerate multiple transfer attempts and minimal gait training this session. Pt continues to require physical assistance to transfer and is unsteady with all gait. PT encourages pt to perform HEP in bed and mobilize over the weekend with assistance of nursing staff. Pt will continue to benefit from acute PT POC to improve mobility quality and reduce falls risk. PT continues to recommend SNF placement as pt was denied admission by CIR.   Follow Up Recommendations  SNF;Supervision/Assistance - 24 hour (pt denied CIR by insurance)     Equipment Recommendations  Rolling walker with 5" wheels;3in1 (PT)    Recommendations for Other Services       Precautions / Restrictions Precautions Precautions: Fall Restrictions Weight Bearing Restrictions: No    Mobility  Bed Mobility Overal bed mobility: Needs Assistance Bed Mobility: Supine to Sit;Sit to Supine     Supine to sit: Supervision;HOB elevated Sit to supine: Supervision;HOB elevated      Transfers Overall transfer level: Needs assistance Equipment used: Rolling walker (2 wheeled) Transfers: Sit to/from Stand Sit to Stand: Mod assist (from lower surface)            Ambulation/Gait Ambulation/Gait assistance: Min assist Gait Distance (Feet): 3 Feet Assistive device: Rolling walker (2 wheeled) Gait Pattern/deviations: Step-to pattern Gait velocity: reduced Gait velocity interpretation: <1.31 ft/sec, indicative of household ambulator General Gait Details: pt with short step-to gait, PT provides cues for backward  stepping   Stairs             Wheelchair Mobility    Modified Rankin (Stroke Patients Only)       Balance Overall balance assessment: Needs assistance Sitting-balance support: No upper extremity supported;Feet supported Sitting balance-Leahy Scale: Fair     Standing balance support: Bilateral upper extremity supported Standing balance-Leahy Scale: Poor Standing balance comment: reliant on BUE support of RW                            Cognition Arousal/Alertness: Awake/alert Behavior During Therapy: WFL for tasks assessed/performed Overall Cognitive Status: Impaired/Different from baseline Area of Impairment: Memory;Awareness                     Memory: Decreased short-term memory     Awareness: Emergent          Exercises      General Comments General comments (skin integrity, edema, etc.): VSS on RA, pt fatigues rapidly. Pt incontinent of bowel, PT assists with hygiene tasks      Pertinent Vitals/Pain Pain Assessment: Faces Faces Pain Scale: Hurts a little bit Pain Location: abdomen Pain Descriptors / Indicators: Grimacing Pain Intervention(s): Monitored during session    Home Living                      Prior Function            PT Goals (current goals can now be found in the care plan section) Acute Rehab PT Goals Patient Stated Goal: To get stronger. PT Goal Formulation: With patient Time For Goal Achievement: 08/17/20 Potential to  Achieve Goals: Fair Progress towards PT goals: Progressing toward goals (very slowly)    Frequency    Min 3X/week      PT Plan Current plan remains appropriate    Co-evaluation              AM-PAC PT "6 Clicks" Mobility   Outcome Measure  Help needed turning from your back to your side while in a flat bed without using bedrails?: A Little Help needed moving from lying on your back to sitting on the side of a flat bed without using bedrails?: A Little Help needed  moving to and from a bed to a chair (including a wheelchair)?: A Little Help needed standing up from a chair using your arms (e.g., wheelchair or bedside chair)?: A Little Help needed to walk in hospital room?: A Little Help needed climbing 3-5 steps with a railing? : A Lot 6 Click Score: 17    End of Session   Activity Tolerance: Patient limited by fatigue Patient left: in bed;with call bell/phone within reach;with bed alarm set Nurse Communication: Mobility status PT Visit Diagnosis: Other abnormalities of gait and mobility (R26.89);Difficulty in walking, not elsewhere classified (R26.2)     Time: 0539-7673 PT Time Calculation (min) (ACUTE ONLY): 17 min  Charges:  $Therapeutic Activity: 8-22 mins                     Zenaida Niece, PT, DPT Acute Rehabilitation Pager: [phone]    Zenaida Niece 08/03/2020, 3:58 PM

## 2020-08-03 NOTE — Plan of Care (Signed)
  Problem: Education: Goal: Knowledge of General Education information will improve Description: Including pain rating scale, medication(s)/side effects and non-pharmacologic comfort measures Outcome: Progressing   Problem: Health Behavior/Discharge Planning: Goal: Ability to manage health-related needs will improve Outcome: Progressing   Problem: Clinical Measurements: Goal: Ability to maintain clinical measurements within normal limits will improve Outcome: Progressing Goal: Will remain free from infection Outcome: Progressing Goal: Diagnostic test results will improve Outcome: Progressing Goal: Respiratory complications will improve Outcome: Progressing Goal: Cardiovascular complication will be avoided Outcome: Progressing   Problem: Activity: Goal: Risk for activity intolerance will decrease Outcome: Progressing   Problem: Nutrition: Goal: Adequate nutrition will be maintained Outcome: Progressing   Problem: Coping: Goal: Level of anxiety will decrease Outcome: Progressing   Problem: Elimination: Goal: Will not experience complications related to bowel motility Outcome: Progressing Goal: Will not experience complications related to urinary retention Outcome: Progressing   Problem: Pain Managment: Goal: General experience of comfort will improve Outcome: Progressing   Problem: Safety: Goal: Ability to remain free from injury will improve Outcome: Progressing   Problem: Skin Integrity: Goal: Risk for impaired skin integrity will decrease Outcome: Progressing   Problem: Education: Goal: Expressions of having a comfortable level of knowledge regarding the disease process will increase Outcome: Progressing   Problem: Coping: Goal: Ability to adjust to condition or change in health will improve Outcome: Progressing Goal: Ability to identify appropriate support needs will improve Outcome: Progressing   Problem: Health Behavior/Discharge Planning: Goal:  Compliance with prescribed medication regimen will improve Outcome: Progressing   Problem: Medication: Goal: Risk for medication side effects will decrease Outcome: Progressing   Problem: Clinical Measurements: Goal: Complications related to the disease process, condition or treatment will be avoided or minimized Outcome: Progressing Goal: Diagnostic test results will improve Outcome: Progressing   Problem: Safety: Goal: Verbalization of understanding the information provided will improve Outcome: Progressing   Problem: Self-Concept: Goal: Level of anxiety will decrease Outcome: Progressing Goal: Ability to verbalize feelings about condition will improve Outcome: Progressing   Problem: Education: Goal: Knowledge of disease or condition will improve Outcome: Progressing Goal: Understanding of discharge needs will improve Outcome: Progressing   Problem: Health Behavior/Discharge Planning: Goal: Ability to identify changes in lifestyle to reduce recurrence of condition will improve Outcome: Progressing Goal: Identification of resources available to assist in meeting health care needs will improve Outcome: Progressing   Problem: Physical Regulation: Goal: Complications related to the disease process, condition or treatment will be avoided or minimized Outcome: Progressing   Problem: Safety: Goal: Ability to remain free from injury will improve Outcome: Progressing

## 2020-08-04 LAB — COMPREHENSIVE METABOLIC PANEL
ALT: 158 U/L — ABNORMAL HIGH (ref 0–44)
AST: 152 U/L — ABNORMAL HIGH (ref 15–41)
Albumin: 1.7 g/dL — ABNORMAL LOW (ref 3.5–5.0)
Alkaline Phosphatase: 231 U/L — ABNORMAL HIGH (ref 38–126)
Anion gap: 9 (ref 5–15)
BUN: 16 mg/dL (ref 6–20)
CO2: 16 mmol/L — ABNORMAL LOW (ref 22–32)
Calcium: 8.6 mg/dL — ABNORMAL LOW (ref 8.9–10.3)
Chloride: 108 mmol/L (ref 98–111)
Creatinine, Ser: 1.06 mg/dL (ref 0.61–1.24)
GFR, Estimated: >60 mL/min (ref >60)
Glucose, Bld: 94 mg/dL (ref 70–99)
Potassium: 3.1 mmol/L — ABNORMAL LOW (ref 3.5–5.1)
Sodium: 133 mmol/L — ABNORMAL LOW (ref 135–145)
Total Bilirubin: 24.5 mg/dL (ref 0.3–1.2)
Total Protein: 5.1 g/dL — ABNORMAL LOW (ref 6.5–8.1)

## 2020-08-04 LAB — CBC
HCT: 23.6 % — ABNORMAL LOW (ref 39.0–52.0)
Hemoglobin: 7.6 g/dL — ABNORMAL LOW (ref 13.0–17.0)
MCH: 32.9 pg (ref 26.0–34.0)
MCHC: 32.2 g/dL (ref 30.0–36.0)
MCV: 102.2 fL — ABNORMAL HIGH (ref 80.0–100.0)
Platelets: 171 10*3/uL (ref 150–400)
RBC: 2.31 MIL/uL — ABNORMAL LOW (ref 4.22–5.81)
WBC: 12.7 10*3/uL — ABNORMAL HIGH (ref 4.0–10.5)
nRBC: 0 % (ref 0.0–0.2)

## 2020-08-04 MED ORDER — POTASSIUM CHLORIDE CRYS ER 20 MEQ PO TBCR
40.0000 meq | EXTENDED_RELEASE_TABLET | Freq: Once | ORAL | Status: AC
  Administered 2020-08-04: 40 meq via ORAL
  Filled 2020-08-04: qty 2

## 2020-08-04 MED ORDER — FUROSEMIDE 40 MG PO TABS
40.0000 mg | ORAL_TABLET | Freq: Every day | ORAL | Status: DC
  Administered 2020-08-04 – 2020-08-06 (×3): 40 mg via ORAL
  Filled 2020-08-04 (×3): qty 1

## 2020-08-04 NOTE — TOC Progression Note (Signed)
Transition of Care [facility]) - Progression Note    Patient Details  Name: [name] MRN: [id number] Date of Birth: 10-29-1970  Transition of Care [facility]) CM/SW [place], [place] Phone Number: 08/04/2020, 3:35 PM  Clinical Narrative:   CSW advised pt that His only bed offer is [place], he noted understanding, and asked if anymore could be found. CSW agreed to call a few facilities, but advised that he will need to choose by tomorrow in order to be ready for DC. Pt asked CSW to talk to his mother. Pt's mother and sister were on the phone together. They noted understanding that he only has one bed offer. Questions were answered SW will continue to follow.        Expected Discharge Plan and Services                                                 Social Determinants of Health (SDOH) Interventions    Readmission Risk Interventions Readmission Risk Prevention Plan 01/15/2019  Medication Screening Complete  Transportation Screening Complete  Some recent data might be hidden

## 2020-08-04 NOTE — Progress Notes (Signed)
[facility] Progress Note  Patient: [name]    TWS:[id number]  [place]: 07/16/2020     Date of Service: the patient was seen and examined on 08/04/2020  Brief hospital course: Past medical history of ongoing alcohol abuse, fatty liver disease, HTN, GERD, schizoaffective disorder. Found to have possible seizures. Patient was intubated in the ER for airway protection. Treated for alcoholic hepatitis of the ICU. GI was consulted. Also metabolic encephalopathy. After extubation was transferred out of the ICU. 11/29 Admission. MEDDRY SCORE 34 and started solumedrol, GI consulted 12/1 PICC line placement 12/2 extubated, reintubated. 12/3 Disconnected self from vent multiple times overnight due to agitation/confusion, MELD score - 27 points  12/4 - dense left sided pneumonia. 12/8 extubated on 2 LPM  Currently plan is monitor for stability and LFTs and Hb and arrange for SNF transfer.  Assessment and Plan: 1. Decompensated alcoholic liver cirrhosis Portal hypertension Acute hepatic encephalopathy Alcoholic hepatitis Presented with seizure-like event. Required ICU admission and intubation. Started on Solu-Medrol, lactulose. GI was consulted. Prednisone was discontinued as [name] score was suggestive of poor response. GI signed off recommending no further work-up or intervention. Prior provider discussed with GI due to elevated total bilirubin and patient was started on NAC protocol. Currently completed. Currently bilirubin improving.  2. Ascites Patient appears to be having dense ascites as well as anasarca. Likely from hypoalbuminemia. Continue Lasix Aldactone.  Will increase the dose of Lasix today.  3. Alcohol abuse Marijuana abuse Active smoker UDS positive for benzos as well as THC. EEG negative for seizures. Currently beyond withdrawal.. Continue thiamine folic acid and multivitamin. Not a candidate for liver transplant for now, given ongoing alcohol  abuse. Currently patient resolved to quit alcohol completely.  4. Essential hypertension Blood pressure currently actually soft. On midodrine. We'll monitor.  5. Schizoaffective disorder Currently stable with On Seroquel and Zoloft.  6. Acute hypoxic respiratory failure requiring intubation, resolved., POA. Monitor.  7. Hypokalemia. Currently being replaced.  8. Obesity Contributing to patient's alcoholic liver disease.  Body mass index is 34.29 kg/m.  Nutrition Problem: Increased nutrient needs Etiology: catabolic illness Interventions: Interventions: Prostat,MVI,Tube feeding  Pressure Injury 07/16/20 Buttocks Medial Stage 1 -  Intact skin with non-blanchable redness of a localized area usually over a bony prominence. (Active)  07/16/20 2000  Location: Buttocks  Location Orientation: Medial  Staging: Stage 1 -  Intact skin with non-blanchable redness of a localized area usually over a bony prominence.  Wound Description (Comments):   Present on Admission: Yes     Diet: Low-sodium diet DVT Prophylaxis:   heparin injection 5,000 Units Start: 07/16/20 2200 SCDs Start: 07/16/20 1739    Advance goals of care discussion: Full code explain patient's prognosis regarding his meld score  Family Communication: no family was present at bedside, at the time of interview.  Discussed with family on 12/16.  Disposition:  Status is: Inpatient  Remains inpatient appropriate because:IV treatments appropriate due to intensity of illness or inability to take PO and Inpatient level of care appropriate due to severity of illness  Dispo: The patient is from: Home              Anticipated d/c is to: SNF              Anticipated d/c date is: 3 days              Patient currently is not medically stable to d/c.  Subjective: Abdomen feeling less distended.  No nausea no vomiting.  No fever no chills.  No diarrhea.  No blood in the stool.  Physical Exam:  General: Appear in mild  distress, no Rash; Oral Mucosa Clear, moist. no Abnormal Neck Mass Or lumps, Conjunctiva normal  Cardiovascular: S1 and S2 Present, no Murmur, Respiratory: good respiratory effort, Bilateral Air entry present and CTA, no Crackles, no wheezes Abdomen: Bowel Sound present, Soft and distended, no tenderness Extremities: Improving bilateral pedal edema Neurology: alert and oriented to time, place, and person affect appropriate. no new focal deficit, no asterixis Gait not checked due to patient safety concerns  Vitals:   08/04/20 0500 08/04/20 0709 08/04/20 1313 08/04/20 1704  BP:  116/72 120/80 123/74  Pulse:  85 96 96  Resp:  18 20 16   Temp:  (!) 97.5 F (36.4 C) 97.8 F (36.6 C) 97.7 F (36.5 C)  TempSrc:  Oral Oral Oral  SpO2:  100% 99% 100%  Weight: 114.7 kg     Height:        Intake/Output Summary (Last 24 hours) at 08/04/2020 1746 Last data filed at 08/04/2020 2878 Gross per 24 hour  Intake -  Output 750 ml  Net -750 ml   Filed Weights   08/02/20 0500 08/03/20 0448 08/04/20 0500  Weight: 113.4 kg 113.7 kg 114.7 kg    Data Reviewed: I have personally reviewed and interpreted daily labs, tele strips, imagings as discussed above. I reviewed all nursing notes, pharmacy notes, vitals, pertinent old records I have discussed plan of care as described above with RN and patient/family.  CBC: Recent Labs  Lab 07/31/20 0445 08/01/20 0437 08/02/20 0333 08/03/20 0450 08/04/20 0317  WBC 16.9* 13.2* 13.2* 13.5* 12.7*  HGB 7.8* 7.8* 7.9* 8.0* 7.6*  HCT 24.7* 24.9* 24.0* 23.8* 23.6*  MCV 103.3* 102.9* 102.1* 101.7* 102.2*  PLT 151 144* 145* 163 676   Basic Metabolic Panel: Recent Labs  Lab 07/29/20 0123 07/30/20 0457 07/31/20 0445 08/01/20 0437 08/02/20 0333 08/03/20 0450 08/04/20 0317  NA 144 141 140 140 138 138 133*  K 3.6 3.6 3.7 3.1* 3.2* 3.3* 3.1*  CL 117* 113* 114* 111 110 108 108  CO2 18* 18* 18* 16* 15* 15* 16*  GLUCOSE 116* 145* 129* 107* 104* 145* 94   BUN 23* 21* 20 20 18 17 16   CREATININE 0.86 0.79 0.86 0.95 0.92 1.10 1.06  CALCIUM 8.3* 8.5* 8.5* 8.4* 8.5* 8.5* 8.6*  MG 2.2 2.2 2.2 2.1 2.0  --   --     Studies: No results found.  Scheduled Meds: . B-complex with vitamin C  1 tablet Oral Daily  . chlorhexidine  15 mL Mouth Rinse BID  . Chlorhexidine Gluconate Cloth  6 each Topical Daily  . cholecalciferol  2,000 Units Oral Daily  . feeding supplement  237 mL Oral TID BM  . folic acid  1 mg Oral Daily  . furosemide  40 mg Oral Daily  . heparin injection (subcutaneous)  5,000 Units Subcutaneous Q8H  . lactulose  20 g Oral TID  . mouth rinse  15 mL Mouth Rinse q12n4p  . midodrine  10 mg Oral TID WC  . multivitamin with minerals  1 tablet Oral Daily  . pantoprazole  40 mg Oral BID  . QUEtiapine  50 mg Oral QHS  . rifaximin  550 mg Oral BID  . sertraline  25 mg Oral Daily  . sodium chloride flush  10-40 mL Intracatheter Q12H  . spironolactone  50 mg Oral Daily  . thiamine  100  mg Oral Daily  . vitamin A  10,000 Units Oral Daily  . vitamin C  250 mg Oral BID   Continuous Infusions: . sodium chloride Stopped (07/17/20 1958)   PRN Meds: sodium chloride, alum & mag hydroxide-simeth, dextromethorphan-guaiFENesin, docusate sodium, ipratropium-albuterol, phenol, polyethylene glycol, Resource ThickenUp Clear, sodium chloride flush  Time spent: 35 minutes  Author: Berle Mull, MD [facility] 08/04/2020 5:46 PM  To reach On-call, see care teams to locate the attending and reach out via [email or web address]. Between 7PM-7AM, please contact night-coverage If you still have difficulty reaching the attending provider, please page the [facility] (Director on Call) for [facility] on amion for assistance.

## 2020-08-05 LAB — CBC
HCT: 23.6 % — ABNORMAL LOW (ref 39.0–52.0)
Hemoglobin: 7.6 g/dL — ABNORMAL LOW (ref 13.0–17.0)
MCH: 33.2 pg (ref 26.0–34.0)
MCHC: 32.2 g/dL (ref 30.0–36.0)
MCV: 103.1 fL — ABNORMAL HIGH (ref 80.0–100.0)
Platelets: 200 10*3/uL (ref 150–400)
RBC: 2.29 MIL/uL — ABNORMAL LOW (ref 4.22–5.81)
RDW: 23.9 % — ABNORMAL HIGH (ref 11.5–15.5)
WBC: 12.8 10*3/uL — ABNORMAL HIGH (ref 4.0–10.5)
nRBC: 0 % (ref 0.0–0.2)

## 2020-08-05 LAB — COMPREHENSIVE METABOLIC PANEL
ALT: 143 U/L — ABNORMAL HIGH (ref 0–44)
AST: 147 U/L — ABNORMAL HIGH (ref 15–41)
Albumin: 1.7 g/dL — ABNORMAL LOW (ref 3.5–5.0)
Alkaline Phosphatase: 236 U/L — ABNORMAL HIGH (ref 38–126)
Anion gap: 9 (ref 5–15)
BUN: 15 mg/dL (ref 6–20)
CO2: 17 mmol/L — ABNORMAL LOW (ref 22–32)
Calcium: 8.4 mg/dL — ABNORMAL LOW (ref 8.9–10.3)
Chloride: 105 mmol/L (ref 98–111)
Creatinine, Ser: 1.13 mg/dL (ref 0.61–1.24)
GFR, Estimated: >60 mL/min (ref >60)
Glucose, Bld: 103 mg/dL — ABNORMAL HIGH (ref 70–99)
Potassium: 3 mmol/L — ABNORMAL LOW (ref 3.5–5.1)
Sodium: 131 mmol/L — ABNORMAL LOW (ref 135–145)
Total Bilirubin: 23.2 mg/dL (ref 0.3–1.2)
Total Protein: 5.2 g/dL — ABNORMAL LOW (ref 6.5–8.1)

## 2020-08-05 LAB — SARS CORONAVIRUS 2 BY RT PCR (HOSPITAL ORDER, PERFORMED IN ~~LOC~~ HOSPITAL LAB): SARS Coronavirus 2: NEGATIVE

## 2020-08-05 MED ORDER — SPIRONOLACTONE 25 MG PO TABS
100.0000 mg | ORAL_TABLET | Freq: Every day | ORAL | Status: DC
Start: 2020-08-05 — End: 2020-08-06
  Administered 2020-08-05 – 2020-08-06 (×2): 100 mg via ORAL
  Filled 2020-08-05 (×2): qty 4

## 2020-08-05 MED ORDER — LACTULOSE 10 GM/15ML PO SOLN
30.0000 g | Freq: Three times a day (TID) | ORAL | Status: DC
  Administered 2020-08-05 – 2020-08-06 (×3): 30 g via ORAL
  Filled 2020-08-05 (×3): qty 60

## 2020-08-05 MED ORDER — POTASSIUM CHLORIDE CRYS ER 20 MEQ PO TBCR
40.0000 meq | EXTENDED_RELEASE_TABLET | ORAL | Status: AC
  Administered 2020-08-05 (×2): 40 meq via ORAL
  Filled 2020-08-05 (×2): qty 2

## 2020-08-05 NOTE — Progress Notes (Addendum)
[facility] Progress Note  Patient: [name]    ZOX:[id number]  [place]: 07/16/2020     Date of Service: the patient was seen and examined on 08/05/2020  Brief hospital course: Past medical history of ongoing alcohol abuse, fatty liver disease, HTN, GERD, schizoaffective disorder. Found to have possible seizures. Patient was intubated in the ER for airway protection. Treated for alcoholic hepatitis of the ICU. GI was consulted. Also metabolic encephalopathy. After extubation was transferred out of the ICU. 11/29 Admission. MEDDRY SCORE 34 and started solumedrol, GI consulted 12/1 PICC line placement 12/2 extubated, reintubated. 12/3 Disconnected self from vent multiple times overnight due to agitation/confusion, MELD score - 27 points  12/4 - dense left sided pneumonia. 12/8 extubated on 2 LPM 12/14 started on NAC protocol completed on 12/17 12/16 started on Lasix Aldactone combination.  Currently plan is arrange for SNF tomorrow  Assessment and Plan: 1. Decompensated alcoholic liver cirrhosis Portal hypertension Acute hepatic encephalopathy Alcoholic hepatitis Presented with seizure-like event.  Required ICU admission and intubation. Started on Solu-Medrol, lactulose. GI was consulted. Prednisolone was discontinued as [name] score was suggestive of poor response. GI signed off recommending no further work-up or intervention. Prior provider discussed with GI due to elevated total bilirubin and patient was started on NAC protocol. Currently completed. Currently bilirubin improving. Lactulose dose increased on 12/19 due to no BM for 24 hours despite 20 mg dose 3 times daily.  2. Ascites Patient appears to be having dense ascites as well as anasarca. Likely from hypoalbuminemia. Continue Lasix Aldactone. Tolerating well without any hypotension.  3. Alcohol abuse Marijuana abuse Active smoker UDS positive for benzos as well as THC. EEG negative for seizures. Currently  beyond withdrawal.. Continue thiamine folic acid and multivitamin. Not a candidate for liver transplant for now, given ongoing alcohol abuse. Currently patient resolved to quit alcohol and smoking completely.  4. Essential hypertension Blood pressure currently actually soft. On midodrine. We'll monitor.  5. Schizoaffective disorder Currently stable with On Seroquel and Zoloft.  6. Acute hypoxic respiratory failure requiring intubation, resolved., POA. Monitor.  7. Hypokalemia. Currently being replaced. Anticipating no further need for replacement once Aldactone starts working  8. Obesity Contributing to patient's alcoholic liver disease.  Body mass index is 33.22 kg/m.  Nutrition Problem: Increased nutrient needs Etiology: catabolic illness Interventions: Interventions: Prostat,MVI,Tube feeding   9. Unsteady gait. Secondary to physical deconditioning. No stroke no focal deficit. PT recommends SNF. Patient will benefit from skilled therapy to improve his mobility and independence.  Pressure Injury 07/16/20 Buttocks Medial Stage 1 -  Intact skin with non-blanchable redness of a localized area usually over a bony prominence. (Active)  07/16/20 2000  Location: Buttocks  Location Orientation: Medial  Staging: Stage 1 -  Intact skin with non-blanchable redness of a localized area usually over a bony prominence.  Wound Description (Comments):   Present on Admission: Yes     Diet: Low-sodium diet DVT Prophylaxis:   heparin injection 5,000 Units Start: 07/16/20 2200 SCDs Start: 07/16/20 1739    Advance goals of care discussion: Full code explain patient's prognosis regarding his meld score  Family Communication: no family was present at bedside, at the time of interview.  Discussed with family on 12/16.  Disposition:  Status is: Inpatient  Remains inpatient appropriate because:Unsafe d/c plan  Dispo:  Patient From: Home  Planned Disposition: [place]  Expected discharge date: 08/06/2020  Medically stable for discharge: Yes  Subjective: Feeling better. No abdominal pain. No nausea no  vomiting. No chest pain. No BM for last 24 hours despite taking lactulose.  Physical Exam:  General: Appear in mild distress, no Rash; Oral Mucosa Clear, moist. no Abnormal Neck Mass Or lumps, Conjunctiva normal  Cardiovascular: S1 and S2 Present, no Murmur, Respiratory: good respiratory effort, Bilateral Air entry present and CTA, no Crackles, no wheezes Abdomen: Bowel Sound present, Soft and distended, no tenderness Extremities: Improving bilateral pedal edema Neurology: alert and oriented to time, place, and person affect appropriate. no new focal deficit, no asterixis Gait not checked due to patient safety concerns  Vitals:   08/05/20 0500 08/05/20 0807 08/05/20 1143 08/05/20 1517  BP:  110/65 118/81 114/81  Pulse:  87 86 85  Resp:  17 16 16   Temp:  97.8 F (36.6 C) 97.9 F (36.6 C) 97.6 F (36.4 C)  TempSrc:  Oral Oral Oral  SpO2:  100% 98% 100%  Weight: 111.1 kg     Height:        Intake/Output Summary (Last 24 hours) at 08/05/2020 1845 Last data filed at 08/05/2020 1700 Gross per 24 hour  Intake 480 ml  Output 2550 ml  Net -2070 ml   Filed Weights   08/03/20 0448 08/04/20 0500 08/05/20 0500  Weight: 113.7 kg 114.7 kg 111.1 kg    Data Reviewed: I have personally reviewed and interpreted daily labs, tele strips, imagings as discussed above. I reviewed all nursing notes, pharmacy notes, vitals, pertinent old records I have discussed plan of care as described above with RN and patient/family.  CBC: Recent Labs  Lab 08/01/20 0437 08/02/20 0333 08/03/20 0450 08/04/20 0317 08/05/20 0409  WBC 13.2* 13.2* 13.5* 12.7* 12.8*  HGB 7.8* 7.9* 8.0* 7.6* 7.6*  HCT 24.9* 24.0* 23.8* 23.6* 23.6*  MCV 102.9* 102.1* 101.7* 102.2* 103.1*  PLT 144* 145* 163 171 856   Basic Metabolic Panel: Recent Labs  Lab 07/30/20 0457  07/31/20 0445 08/01/20 0437 08/02/20 0333 08/03/20 0450 08/04/20 0317 08/05/20 0409  NA 141 140 140 138 138 133* 131*  K 3.6 3.7 3.1* 3.2* 3.3* 3.1* 3.0*  CL 113* 114* 111 110 108 108 105  CO2 18* 18* 16* 15* 15* 16* 17*  GLUCOSE 145* 129* 107* 104* 145* 94 103*  BUN 21* 20 20 18 17 16 15   CREATININE 0.79 0.86 0.95 0.92 1.10 1.06 1.13  CALCIUM 8.5* 8.5* 8.4* 8.5* 8.5* 8.6* 8.4*  MG 2.2 2.2 2.1 2.0  --   --   --     Studies: No results found.  Scheduled Meds: . B-complex with vitamin C  1 tablet Oral Daily  . chlorhexidine  15 mL Mouth Rinse BID  . Chlorhexidine Gluconate Cloth  6 each Topical Daily  . cholecalciferol  2,000 Units Oral Daily  . feeding supplement  237 mL Oral TID BM  . folic acid  1 mg Oral Daily  . furosemide  40 mg Oral Daily  . heparin injection (subcutaneous)  5,000 Units Subcutaneous Q8H  . lactulose  30 g Oral TID  . mouth rinse  15 mL Mouth Rinse q12n4p  . midodrine  10 mg Oral TID WC  . multivitamin with minerals  1 tablet Oral Daily  . pantoprazole  40 mg Oral BID  . QUEtiapine  50 mg Oral QHS  . rifaximin  550 mg Oral BID  . sertraline  25 mg Oral Daily  . sodium chloride flush  10-40 mL Intracatheter Q12H  . spironolactone  100 mg Oral Daily  . thiamine  100 mg Oral Daily  . vitamin A  10,000 Units Oral Daily  . vitamin C  250 mg Oral BID   Continuous Infusions: . sodium chloride Stopped (07/17/20 1958)   PRN Meds: sodium chloride, alum & mag hydroxide-simeth, dextromethorphan-guaiFENesin, docusate sodium, ipratropium-albuterol, phenol, polyethylene glycol, Resource ThickenUp Clear, sodium chloride flush  Time spent: 35 minutes  Author: Berle Mull, MD [facility] 08/05/2020 6:45 PM  To reach On-call, see care teams to locate the attending and reach out via [email or web address]. Between 7PM-7AM, please contact night-coverage If you still have difficulty reaching the attending provider, please page the [facility] (Director on Call) for [facility] on amion for assistance.

## 2020-08-05 NOTE — TOC Progression Note (Signed)
Transition of Care [facility]) - Progression Note    Patient Details  Name: [name] MRN: [id number] Date of Birth: [birth year]  Transition [facility] Care [facility] CM/SW [place], [place] Phone Number: 08/05/2020, 10:19 AM  Clinical Narrative:     CSW spoke with pt and he is agreeable to [facility]. Teena from CP noted she will start insurance auth, and has a room for him when ready. She noted that she needed the dr's note to reflect his need for rehab beyond altered mental status. CSW consulted the DR. SW will continue to follow for DC planning.       Expected Discharge Plan and Services                                                 Social Determinants of Health (SDOH) Interventions    Readmission Risk Interventions Readmission Risk Prevention Plan 01/15/2019  Medication Screening Complete  Transportation Screening Complete  Some recent data might be hidden

## 2020-08-06 LAB — CBC
HCT: 22.9 % — ABNORMAL LOW (ref 39.0–52.0)
Hemoglobin: 7.3 g/dL — ABNORMAL LOW (ref 13.0–17.0)
MCH: 33 pg (ref 26.0–34.0)
MCHC: 31.9 g/dL (ref 30.0–36.0)
MCV: 103.6 fL — ABNORMAL HIGH (ref 80.0–100.0)
Platelets: 206 10*3/uL (ref 150–400)
RBC: 2.21 MIL/uL — ABNORMAL LOW (ref 4.22–5.81)
RDW: 22.7 % — ABNORMAL HIGH (ref 11.5–15.5)
WBC: 13.8 10*3/uL — ABNORMAL HIGH (ref 4.0–10.5)
nRBC: 0 % (ref 0.0–0.2)

## 2020-08-06 LAB — COMPREHENSIVE METABOLIC PANEL
ALT: 116 U/L — ABNORMAL HIGH (ref 0–44)
AST: 125 U/L — ABNORMAL HIGH (ref 15–41)
Albumin: 1.6 g/dL — ABNORMAL LOW (ref 3.5–5.0)
Alkaline Phosphatase: 254 U/L — ABNORMAL HIGH (ref 38–126)
Anion gap: 10 (ref 5–15)
BUN: 17 mg/dL (ref 6–20)
CO2: 19 mmol/L — ABNORMAL LOW (ref 22–32)
Calcium: 8.6 mg/dL — ABNORMAL LOW (ref 8.9–10.3)
Chloride: 103 mmol/L (ref 98–111)
Creatinine, Ser: 1.3 mg/dL — ABNORMAL HIGH (ref 0.61–1.24)
GFR, Estimated: >60 mL/min (ref >60)
Glucose, Bld: 105 mg/dL — ABNORMAL HIGH (ref 70–99)
Potassium: 3.5 mmol/L (ref 3.5–5.1)
Sodium: 132 mmol/L — ABNORMAL LOW (ref 135–145)
Total Bilirubin: 19.9 mg/dL (ref 0.3–1.2)
Total Protein: 5.3 g/dL — ABNORMAL LOW (ref 6.5–8.1)

## 2020-08-06 MED ORDER — LACTULOSE 10 GM/15ML PO SOLN: 30.0000 g | Freq: Three times a day (TID) | ORAL | 0 refills | Status: DC

## 2020-08-06 MED ORDER — FOLIC ACID 1 MG PO TABS: 1.0000 mg | ORAL_TABLET | Freq: Every day | ORAL | Status: DC

## 2020-08-06 MED ORDER — POLYETHYLENE GLYCOL 3350 17 G PO PACK: 17.0000 g | PACK | Freq: Every day | ORAL | 0 refills | Status: DC | PRN

## 2020-08-06 MED ORDER — MIDODRINE HCL 10 MG PO TABS: 10.0000 mg | ORAL_TABLET | Freq: Three times a day (TID) | ORAL | Status: DC

## 2020-08-06 MED ORDER — ASCORBIC ACID 250 MG PO TABS: 250.0000 mg | ORAL_TABLET | Freq: Two times a day (BID) | ORAL | Status: DC

## 2020-08-06 MED ORDER — RIFAXIMIN 550 MG PO TABS: 550.0000 mg | ORAL_TABLET | Freq: Two times a day (BID) | ORAL | Status: DC

## 2020-08-06 MED ORDER — FUROSEMIDE 40 MG PO TABS: 40.0000 mg | ORAL_TABLET | Freq: Every day | ORAL | Status: DC

## 2020-08-06 MED ORDER — THIAMINE HCL 100 MG PO TABS: 100.0000 mg | ORAL_TABLET | Freq: Every day | ORAL | Status: DC

## 2020-08-06 MED ORDER — B COMPLEX-C PO TABS: 1.0000 | ORAL_TABLET | Freq: Every day | ORAL | Status: DC

## 2020-08-06 MED ORDER — PANTOPRAZOLE SODIUM 40 MG PO TBEC: 40.0000 mg | DELAYED_RELEASE_TABLET | Freq: Two times a day (BID) | ORAL | Status: DC

## 2020-08-06 MED ORDER — SPIRONOLACTONE 100 MG PO TABS: 100.0000 mg | ORAL_TABLET | Freq: Every day | ORAL | Status: DC

## 2020-08-06 NOTE — Progress Notes (Signed)
Occupational Therapy Treatment Patient Details Name: [name] MRN: [id number] DOB: [birth year] Today's Date: 08/06/2020    History of present illness 49 y.o. male admitted with AMS with encephalopathy and alcholic hepatitis. Intubated in ED on 11/29, extubated and reintubated 12/2. PMHx: alcoholism, HTN, GERD, schizoaffective disorder   OT comments  Pt making steady progress towards OT goals this session. Pt incontinent of stool upon OTA arrival, assisted NT with pericare from bed level with pt needing total A for posterior pericare. Pt able to progress OOB to recliner with RW and MIN A. Pt currently requires MIN A for UB ADLS and total A for LB ADLs. Pt continues to present with impaired balance, decreased activity tolerance and generalized weakness. Pt would continue to benefit from skilled occupational therapy while admitted and after d/c to address the below listed limitations in order to improve overall functional mobility and facilitate independence with BADL participation. DC plan remains appropriate, will follow acutely per POC.     Follow Up Recommendations  SNF    Equipment Recommendations  3 in 1 bedside commode    Recommendations for Other Services      Precautions / Restrictions Precautions Precautions: Fall Restrictions Weight Bearing Restrictions: No       Mobility Bed Mobility Overal bed mobility: Needs Assistance Bed Mobility: Supine to Sit     Supine to sit: Min guard;HOB elevated     General bed mobility comments: min guard for line mgmt  Transfers Overall transfer level: Needs assistance Equipment used: Rolling walker (2 wheeled) Transfers: Sit to/from [facility] Sit to Stand: Min assist Stand pivot transfers: Min assist       General transfer comment: MIN A and cues for hand placement to power into standing from EOB, MIN A for balance and RW mgmt to pivot to recliner    Balance Overall balance assessment: Needs  assistance Sitting-balance support: No upper extremity supported;Feet supported Sitting balance-Leahy Scale: Fair     Standing balance support: Bilateral upper extremity supported Standing balance-Leahy Scale: Poor Standing balance comment: reliant on BUE support of RW                           ADL either performed or assessed with clinical judgement   ADL Overall ADL's : Needs assistance/impaired                 Upper Body Dressing : Minimal assistance;Bed level Upper Body Dressing Details (indicate cue type and reason): to don new gown from supine Lower Body Dressing: Total assistance;Bed level Lower Body Dressing Details (indicate cue type and reason): to don socks from bed level Toilet Transfer: Minimal assistance;RW;Stand-pivot Toilet Transfer Details (indicate cue type and reason): simulated via functional mobiity with RW and MIN A Toileting- Clothing Manipulation and Hygiene: Total assistance;Bed level Toileting - Clothing Manipulation Details (indicate cue type and reason): posterior pericare     Functional mobility during ADLs: Minimal assistance;Rolling walker General ADL Comments: pt continues to present with impaired balance, decreased activity tolerance and generalized weakness, however able to progress OOB to recliner this session with MIN A +1 and Rw     Vision       Perception     Praxis      Cognition Arousal/Alertness: Awake/alert Behavior During Therapy: WFL for tasks assessed/performed Overall Cognitive Status: Within Functional Limits for tasks assessed  General Comments: overall WFL for simple mobility tasks, not formally assessed        Exercises     Shoulder Instructions       General Comments VSS, incontinet of stool during session , asssited NT with pericare from bed level    Pertinent Vitals/ Pain       Pain Assessment: No/denies pain Pain Location: abdomen  Home Living                                           Prior Functioning/Environment              Frequency  Min 2X/week        Progress Toward Goals  OT Goals(current goals can now be found in the care plan section)  Progress towards OT goals: Progressing toward goals  Acute Rehab OT Goals Patient Stated Goal: To get stronger. OT Goal Formulation: With patient Time For Goal Achievement: 08/07/20 Potential to Achieve Goals: Good  Plan Discharge plan remains appropriate;Frequency remains appropriate    Co-evaluation                 AM-PAC OT "6 Clicks" Daily Activity     Outcome Measure   Help from another person eating meals?: None Help from another person taking care of personal grooming?: A Little Help from another person toileting, which includes using toliet, bedpan, or urinal?: A Lot Help from another person bathing (including washing, rinsing, drying)?: A Lot Help from another person to put on and taking off regular upper body clothing?: A Lot Help from another person to put on and taking off regular lower body clothing?: Total 6 Click Score: 14    End of Session Equipment Utilized During Treatment: Gait belt;Rolling walker  OT Visit Diagnosis: Unsteadiness on feet (R26.81);Muscle weakness (generalized) (M62.81);Other symptoms and signs involving cognitive function   Activity Tolerance Patient tolerated treatment well   Patient Left in chair;with call bell/phone within reach;with chair alarm set;Other (comment) (eating lunch)   Nurse Communication Mobility status        Time: 7121-9758 OT Time Calculation (min): 13 min  Charges: OT General Charges $OT Visit: 1 Visit OT Treatments $Self Care/Home Management : 8-22 mins  Lanier Clam., COTA/L Acute Rehabilitation Services [phone]    Ihor Gully 08/06/2020, 3:41 PM

## 2020-08-06 NOTE — Discharge Summary (Signed)
Physician Discharge Summary   [name] JJH:[id number] DOB: [birth year] DOA: 07/16/2020  PCP: Ryan Bang, DO  Admit date: 07/16/2020 Discharge date: 08/06/2020  Admitted From: Home Disposition: [name], SNF Discharging physician: Ryan Dee, MD  Recommendations for Outpatient Follow-up:  1. Repeat CMP every 2-3 days for assessing K, creatinine, and LFTs 2. Lactulose may need further adjustment    Patient discharged to [facility] in Discharge Condition: stable CODE STATUS: Full Diet recommendation:  Diet Orders (From admission, onward)    Start     Ordered   08/05/20 0916  Diet 2 gram sodium Room service appropriate? Yes; Fluid consistency: Thin  Diet effective now       Question Answer Comment  Room service appropriate? Yes   Fluid consistency: Thin      08/05/20 0915          Hospital Course:  Past medical history of ongoing alcohol abuse, fatty liver disease, HTN, GERD, schizoaffective disorder. Found to have possible seizures. Patient was intubated in the ER for airway protection. Treated for alcoholic hepatitis of the ICU. GI was consulted. Also metabolic encephalopathy. After extubation was transferred out of the ICU. 11/29 Admission. MEDDRY SCORE 34 and started solumedrol, GI consulted 12/1 PICC line placement 12/2 extubated, reintubated. 12/3 Disconnected self from vent multiple times overnight due to agitation/confusion, MELD score - 27 points  12/4 - dense left sided pneumonia. 12/8 extubated on 2 LPM 12/14 started on NAC protocol completed on 12/17 12/16 started on Lasix Aldactone combination.  Decompensated alcoholic liver cirrhosis Portal hypertension Acute hepatic encephalopathy Alcoholic hepatitis Presented with seizure-like event.  Required ICU admission and intubation. Started on Solu-Medrol, lactulose. GI was consulted. Prednisolone was discontinued as [name] score was suggestive of poor response. GI signed off  recommending no further work-up or intervention. Prior provider discussed with GI due to elevated total bilirubin and patient was started on NAC protocol. Course completed. Currently bilirubin improving - check intermittently Lactulose dose increased on 12/19 due to no BM for 24 hours despite 20 mg dose 3 times daily.  Alcoholic cirrhosis with ascites Patient appears to be having dense ascites as well as anasarca. Likely from hypoalbuminemia. Continue Lasix + Aldactone. Tolerating well without any hypotension. - check BMP intermittently for any hypokalemia   Alcohol abuse Marijuana abuse Active smoker UDS positive for benzos as well as THC. EEG negative for seizures. Continue thiamine folic acid and multivitamin. Not a candidate for liver transplant, given ongoing alcohol abuse. Currently patient resolved to quit alcohol and smoking completely.  Essential hypertension BP consistent with cirrhotic BP  On midodrine. Ideally would like to taper off in future if able   Schizoaffective disorder On Seroquel and Zoloft.  Acute hypoxic respiratory failure requiring intubation, resolved., POA.  Hypokalemia. - monitor BMP intermittently with ongoing lower extremity edema and diuresis  Obesity Contributing to patient's alcoholic liver disease.  Body mass index is 33.22 kg/m.  Nutrition Problem: Increased nutrient needs Etiology: catabolic illness Interventions: Interventions: Prostat,MVI,Tube feeding   Unsteady gait. Secondary to physical deconditioning. No stroke no focal deficit. PT recommends SNF. Patient will benefit from skilled therapy to improve his mobility and independence.  Pressure Injury 07/16/20 Buttocks Medial Stage 1 -  Intact skin with non-blanchable redness of a localized area usually over a bony prominence. (Active)  07/16/20 2000  Location: Buttocks  Location Orientation: Medial  Staging: Stage 1 -  Intact skin with non-blanchable redness of a  localized area usually over a bony prominence.  Wound Description (  Comments):   Present on Admission: Yes    Principal Diagnosis: Septic shock [facility])  Discharge Diagnoses: Active Hospital Problems   Diagnosis Date Noted  . Alcoholic hepatitis with ascites   . Acute respiratory failure ([place])   . Pressure injury of skin 07/17/2020  . Encephalopathy, hepatic ([place]) 07/16/2020    Resolved Hospital Problems   Diagnosis Date Noted Date Resolved  . Septic shock ([place])  08/06/2020    Discharge Instructions    Increase activity slowly   Complete by: As directed    No wound care   Complete by: As directed      Allergies as of 08/06/2020      Reactions   Wellbutrin [bupropion] Other (See Comments)   Caused seizures      Medication List    STOP taking these medications   allopurinol 100 MG tablet Commonly known as: ZYLOPRIM   amLODipine 10 MG tablet Commonly known as: NORVASC   loperamide 2 MG capsule Commonly known as: IMODIUM   metoprolol tartrate 25 MG tablet Commonly known as: LOPRESSOR   MILK THISTLE PO   Misc. Devices Misc     TAKE these medications   ascorbic acid 250 MG tablet Commonly known as: VITAMIN C Take 1 tablet (250 mg total) by mouth 2 (two) times daily.   B-complex with vitamin C tablet Take 1 tablet by mouth daily. Start taking on: August 07, 5642   folic acid 1 MG tablet Commonly known as: FOLVITE Take 1 tablet (1 mg total) by mouth daily. Start taking on: August 07, 2020   furosemide 40 MG tablet Commonly known as: LASIX Take 1 tablet (40 mg total) by mouth daily. Start taking on: August 07, 2020   lactulose 10 GM/15ML solution Commonly known as: CHRONULAC Take 45 mLs (30 g total) by mouth 3 (three) times daily.   midodrine 10 MG tablet Commonly known as: PROAMATINE Take 1 tablet (10 mg total) by mouth 3 (three) times daily with meals.   multivitamin with minerals Tabs tablet Take 1 tablet by mouth daily.   pantoprazole 40  MG tablet Commonly known as: PROTONIX Take 1 tablet (40 mg total) by mouth 2 (two) times daily. What changed: when to take this   polyethylene glycol 17 g packet Commonly known as: MIRALAX / GLYCOLAX Take 17 g by mouth daily as needed for moderate constipation.   QUEtiapine 50 MG tablet Commonly known as: SEROQUEL Take 1 tablet (50 mg total) by mouth at bedtime.   rifaximin 550 MG Tabs tablet Commonly known as: XIFAXAN Take 1 tablet (550 mg total) by mouth 2 (two) times daily.   sertraline 25 MG tablet Commonly known as: ZOLOFT Take 1 tablet (25 mg total) by mouth daily.   spironolactone 100 MG tablet Commonly known as: ALDACTONE Take 1 tablet (100 mg total) by mouth daily. Start taking on: August 07, 2020   thiamine 100 MG tablet Take 1 tablet (100 mg total) by mouth daily. Start taking on: August 07, 2020       Contact information for after-discharge care    Destination    [facility] .   Service: Skilled Nursing Contact information: [address] [address] [phone]                 Allergies  Allergen Reactions  . Wellbutrin [Bupropion] Other (See Comments)    Caused seizures    Consultations:   Discharge Exam: BP 118/67 (BP Location: Left Arm)  Pulse 83   Temp 98.9 F (37.2 C) (Oral)   Resp 20   Ht 6' (1.829 m)   Wt 114.5 kg   SpO2 97%   BMI 34.24 kg/m  General appearance: Grossly jaundiced adult man sitting up in chair in no distress Head: Normocephalic, without obvious abnormality, atraumatic Eyes: Scleral icterus appreciated, EOMI Lungs: clear to auscultation bilaterally Heart: regular rate and rhythm and S1, S2 normal Abdomen: Slightly distended, nontender, bowel sounds present, no abdominal wall edema appreciated Extremities: 3+ lower extremity pitting edema appreciated up to knees Skin: Grossly jaundiced Neurologic: No obvious asterixis.  Does have mild tremor with hands  outstretched  The results of significant diagnostics from this hospitalization (including imaging, microbiology, ancillary and laboratory) are listed below for reference.   Microbiology: Recent Results (from the past 240 hour(s))  SARS Coronavirus 2 by RT PCR (hospital order, performed in [facility] hospital lab) Nasopharyngeal Nasopharyngeal Swab     Status: None   Collection Time: 08/05/20  9:15 AM   Specimen: Nasopharyngeal Swab  Result Value Ref Range Status   SARS Coronavirus 2 NEGATIVE NEGATIVE Final    Comment: (NOTE) SARS-CoV-2 target nucleic acids are NOT DETECTED.  The SARS-CoV-2 RNA is generally detectable in upper and lower respiratory specimens during the acute phase of infection. The lowest concentration of SARS-CoV-2 viral copies this assay can detect is 250 copies / mL. A negative result does not preclude SARS-CoV-2 infection and should not be used as the sole basis for treatment or other patient management decisions.  A negative result may occur with improper specimen collection / handling, submission of specimen other than nasopharyngeal swab, presence of viral mutation(s) within the areas targeted by this assay, and inadequate number of viral copies (<250 copies / mL). A negative result must be combined with clinical observations, patient history, and epidemiological information.  Fact Sheet for Patients:   StrictlyIdeas.no  Fact Sheet for Healthcare Providers: BankingDealers.co.za  This test is not yet approved or  cleared by the [place] FDA and has been authorized for detection and/or diagnosis of SARS-CoV-2 by FDA under an Emergency Use Authorization (EUA).  This EUA will remain in effect (meaning this test can be used) for the duration of the COVID-19 declaration under Section 564(b)(1) of the Act, 21 U.S.C. section 360bbb-3(b)(1), unless the authorization is terminated or revoked sooner.  Performed at  [facility], [place] [address], [place], [place] [address]      Labs: BNP (last 3 results) Recent Labs    10/05/19 1319 05/03/20 0005 05/04/20 1515  BNP 115.4* 128.7* 426.8*   Basic Metabolic Panel: Recent Labs  Lab 07/31/20 0445 08/01/20 0437 08/02/20 0333 08/03/20 0450 08/04/20 0317 08/05/20 0409 08/06/20 0250  NA 140 140 138 138 133* 131* 132*  K 3.7 3.1* 3.2* 3.3* 3.1* 3.0* 3.5  CL 114* 111 110 108 108 105 103  CO2 18* 16* 15* 15* 16* 17* 19*  GLUCOSE 129* 107* 104* 145* 94 103* 105*  BUN 20 20 18 17 16 15 17   CREATININE 0.86 0.95 0.92 1.10 1.06 1.13 1.30*  CALCIUM 8.5* 8.4* 8.5* 8.5* 8.6* 8.4* 8.6*  MG 2.2 2.1 2.0  --   --   --   --    Liver Function Tests: Recent Labs  Lab 08/02/20 0333 08/03/20 0450 08/04/20 0317 08/05/20 0409 08/06/20 0250  AST 186* 162* 152* 147* 125*  ALT 174* 170* 158* 143* 116*  ALKPHOS 218* 244* 231* 236* 254*  BILITOT 27.7* 26.9*  24.5* 23.2* 19.9*  PROT 4.8* 5.2* 5.1* 5.2* 5.3*  ALBUMIN 1.5* 1.6* 1.7* 1.7* 1.6*   No results for input(s): LIPASE, AMYLASE in the last 168 hours. Recent Labs  Lab 08/02/20 0333  AMMONIA 40*   CBC: Recent Labs  Lab 08/02/20 0333 08/03/20 0450 08/04/20 0317 08/05/20 0409 08/06/20 0250  WBC 13.2* 13.5* 12.7* 12.8* 13.8*  HGB 7.9* 8.0* 7.6* 7.6* 7.3*  HCT 24.0* 23.8* 23.6* 23.6* 22.9*  MCV 102.1* 101.7* 102.2* 103.1* 103.6*  PLT 145* 163 171 200 206   Cardiac Enzymes: No results for input(s): CKTOTAL, CKMB, CKMBINDEX, TROPONINI in the last 168 hours. BNP: Invalid input(s): POCBNP CBG: No results for input(s): GLUCAP in the last 168 hours. D-Dimer No results for input(s): DDIMER in the last 72 hours. Hgb A1c No results for input(s): HGBA1C in the last 72 hours. Lipid Profile No results for input(s): CHOL, HDL, LDLCALC, TRIG, CHOLHDL, LDLDIRECT in the last 72 hours. Thyroid function studies No results for input(s): TSH, T4TOTAL, T3FREE, THYROIDAB in the last 72 hours.  Invalid  input(s): FREET3 Anemia work up No results for input(s): VITAMINB12, FOLATE, FERRITIN, TIBC, IRON, RETICCTPCT in the last 72 hours. Urinalysis    Component Value Date/Time   COLORURINE BROWN (A) 07/16/2020 1909   APPEARANCEUR HAZY (A) 07/16/2020 1909   LABSPEC  07/16/2020 1909    TEST NOT REPORTED DUE TO COLOR INTERFERENCE OF URINE PIGMENT   PHURINE  07/16/2020 1909    TEST NOT REPORTED DUE TO COLOR INTERFERENCE OF URINE PIGMENT   GLUCOSEU (A) 07/16/2020 1909    TEST NOT REPORTED DUE TO COLOR INTERFERENCE OF URINE PIGMENT   HGBUR (A) 07/16/2020 1909    TEST NOT REPORTED DUE TO COLOR INTERFERENCE OF URINE PIGMENT   BILIRUBINUR (A) 07/16/2020 1909    TEST NOT REPORTED DUE TO COLOR INTERFERENCE OF URINE PIGMENT   KETONESUR (A) 07/16/2020 1909    TEST NOT REPORTED DUE TO COLOR INTERFERENCE OF URINE PIGMENT   PROTEINUR (A) 07/16/2020 1909    TEST NOT REPORTED DUE TO COLOR INTERFERENCE OF URINE PIGMENT   UROBILINOGEN 1.0 12/24/2014 0643   NITRITE (A) 07/16/2020 1909    TEST NOT REPORTED DUE TO COLOR INTERFERENCE OF URINE PIGMENT   LEUKOCYTESUR (A) 07/16/2020 1909    TEST NOT REPORTED DUE TO COLOR INTERFERENCE OF URINE PIGMENT   Sepsis Labs Invalid input(s): PROCALCITONIN,  WBC,  LACTICIDVEN Microbiology Recent Results (from the past 240 hour(s))  SARS Coronavirus 2 by RT PCR (hospital order, performed in [place] hospital lab) Nasopharyngeal Nasopharyngeal Swab     Status: None   Collection Time: 08/05/20  9:15 AM   Specimen: Nasopharyngeal Swab  Result Value Ref Range Status   SARS Coronavirus 2 NEGATIVE NEGATIVE Final    Comment: (NOTE) SARS-CoV-2 target nucleic acids are NOT DETECTED.  The SARS-CoV-2 RNA is generally detectable in upper and lower respiratory specimens during the acute phase of infection. The lowest concentration of SARS-CoV-2 viral copies this assay can detect is 250 copies / mL. A negative result does not preclude SARS-CoV-2 infection and should not be  used as the sole basis for treatment or other patient management decisions.  A negative result may occur with improper specimen collection / handling, submission of specimen other than nasopharyngeal swab, presence of viral mutation(s) within the areas targeted by this assay, and inadequate number of viral copies (<250 copies / mL). A negative result must be combined with clinical observations, patient history, and epidemiological information.  Fact Sheet for  Patients:   StrictlyIdeas.no  Fact Sheet for Healthcare Providers: BankingDealers.co.za  This test is not yet approved or  cleared by the [place] FDA and has been authorized for detection and/or diagnosis of SARS-CoV-2 by FDA under an Emergency Use Authorization (EUA).  This EUA will remain in effect (meaning this test can be used) for the duration of the COVID-19 declaration under Section 564(b)(1) of the Act, 21 U.S.C. section 360bbb-3(b)(1), unless the authorization is terminated or revoked sooner.  Performed at [facility], [place] [address] [place], [place] [address]     Procedures/Studies: CT Head Wo Contrast  Result Date: 07/16/2020 CLINICAL DATA:  Delirium EXAM: CT HEAD WITHOUT CONTRAST TECHNIQUE: Contiguous axial images were obtained from the base of the skull through the vertex without intravenous contrast. COMPARISON:  CT head and cervical spine 05/03/2020 FINDINGS: Brain: No evidence of acute infarction, hemorrhage, hydrocephalus, extra-axial collection, visible mass lesion or mass effect. Vascular: No hyperdense vessel or unexpected calcification. Skull: No calvarial fracture or suspicious osseous lesion. No scalp swelling or hematoma. Sinuses/Orbits: Nodular mural thickening in the paranasal sinuses most pronounced in the maxillary sinus with some hyperostotic changes suggesting chronicity. No layering air-fluid levels or pneumatized secretions. Included  orbital structures are unremarkable. Other: Endotracheal and transesophageal tubes are noted at the time of exam. IMPRESSION: 1. No acute intracranial findings. 2. Patient intubated at the time exam. 3. Chronic paranasal sinus disease. Electronically Signed   By: Lovena Le M.D.   On: 07/16/2020 17:05   US Abdomen Limited  Result Date: 07/31/2020 CLINICAL DATA:  Ascites. EXAM: LIMITED ABDOMEN ULTRASOUND FOR ASCITES TECHNIQUE: Limited ultrasound survey for ascites was performed in all four abdominal quadrants. COMPARISON:  Ultrasound 07/16/2020. FINDINGS: Tiny amount ascites noted.  No prominent fluid collection noted. IMPRESSION: Tiny amount of ascites. Electronically Signed   By: [place]   On: 07/31/2020 05:30   DG CHEST PORT 1 VIEW  Result Date: 07/23/2020 CLINICAL DATA:  Repositioning of endotracheal tube. EXAM: PORTABLE CHEST 1 VIEW COMPARISON:  Chest x-ray 07/21/2020 FINDINGS: Endotracheal tube terminates approximately 5.5 cm above the carina. Enteric tube courses below the hemidiaphragm. Right PICC with tip overlying the expected region of the superior vena cava. The heart size and mediastinal contours are within normal limits. Interval improvement of diffuse patchy airspace opacities. Interval marked improved aeration of the left lung. Slightly increased interstitial markings. No pleural effusion. No pneumothorax. No acute osseous abnormality. IMPRESSION: 1. Marked improved aeration of the left lung as well as improved but persistent bilateral patchy airspace opacities. 2. Endotracheal tube with tip terminating 5.5 cm above the carina. 3. Enteric tube courses below diaphragm. 4. Right PICC with tip overlying the expected region of the distal superior vena cava. Electronically Signed   By: Iven Finn M.D.   On: 07/23/2020 23:24   DG CHEST PORT 1 VIEW  Result Date: 07/21/2020 CLINICAL DATA:  Aspiration pneumonia.  Follow-up exam EXAM: PORTABLE CHEST 1 VIEW COMPARISON:  07/19/2020  FINDINGS: Bilateral airspace lung opacities are noted, most confluent in the left mid to upper lung, similar to the most recent prior exam allowing for larger lung volumes on the current study. No new lung abnormalities. No convincing pleural effusion and no pneumothorax. Endotracheal tube tip now projects 4 cm above the carina. Naso/orogastric tube passes below the diaphragm well into the stomach, below the included field of view. Right PICC tip projects in the lower superior vena cava. IMPRESSION: 1. No significant change in lung aeration from the  most recent prior study. Persistent, left greater than right, airspace lung opacities. 2. Well-positioned support apparatus. Electronically Signed   By: Lajean Manes M.D.   On: 07/21/2020 09:43   DG CHEST PORT 1 VIEW  Result Date: 07/19/2020 CLINICAL DATA:  ETT placement, hypoxia EXAM: PORTABLE CHEST 1 VIEW COMPARISON:  Same-day radiograph FINDINGS: Endotracheal tube is positioned 2.4 cm from the carina. A right upper extremity PICC remains in place with the tip near the lower SVC. Telemetry leads and external support devices overlie the chest. Lung volumes slightly diminished from most recent comparison. Redemonstration of the widespread airspace opacities in both lungs, most focally confluent in the retrocardiac space and a left upper lung with associated air bronchograms. No visible pneumothorax or effusion. Cardiomediastinal contours are grossly stable though partially obscured by opacity. No acute osseous or soft tissue abnormality. IMPRESSION: Endotracheal tube positioned 2.4 cm from the carina. Could consider retraction 1-2 cm to position in the mid trachea. Stable PICC positioning. Slightly diminished lung volumes with persistent bilateral airspace opacities, left greater than right. These results will be called to the ordering clinician or representative by the Radiologist Assistant, and communication documented in the PACS or [employer].  Electronically Signed   By: Lovena Le M.D.   On: 07/19/2020 21:39   DG Chest Port 1 View  Result Date: 07/19/2020 CLINICAL DATA:  Hypoxia EXAM: PORTABLE CHEST 1 VIEW COMPARISON:  Radiograph 07/19/2020 FINDINGS: Interval removal of the transesophageal and endotracheal tube seen on the comparison exams. A right PICC remains in the lower SVC. There is been interval development of extensive, patchy and confluent perihilar opacities throughout both lungs, left slightly more pronounced than right. Mild fissural fissural thickening. No pneumothorax or visible effusion. Vascularity is indistinct. Cardiac silhouette appears enlarged though portions are obscured by overlying opacity. Remaining cardiomediastinal contours are unremarkable. No acute osseous or soft tissue abnormality. IMPRESSION: Rapid interval of extensive, patchy and confluent perihilar opacities throughout both lungs, left slightly more pronounced than right. Findings concerning for a symmetric pulmonary edema, ARDS or infection. Electronically Signed   By: Lovena Le M.D.   On: 07/19/2020 21:08   DG Chest Port 1 View  Result Date: 07/19/2020 CLINICAL DATA:  Respiratory failure EXAM: PORTABLE CHEST 1 VIEW COMPARISON:  07/18/2020 FINDINGS: Endotracheal tube seen 3 cm above the carina. Nasogastric tube extends into the left upper quadrant of the abdomen beyond the margin of the examination. Right upper extremity PICC line tip noted at the superior cavoatrial junction. Lung volumes are small and there is left basilar atelectasis. Cardiac size within normal limits. No pneumothorax or pleural effusion. IMPRESSION: Right upper extremity PICC line placed with its tip at the superior cavoatrial junction. Otherwise stable support tubes. Pulmonary hypoinflation. Electronically Signed   By: Fidela Salisbury MD   On: 07/19/2020 06:23   DG Chest Port 1 View  Result Date: 07/18/2020 CLINICAL DATA:  Endotracheal tube.  Alcohol withdrawal. EXAM: PORTABLE CHEST  1 VIEW COMPARISON:  July 16, 2020. FINDINGS: Stable cardiomediastinal silhouette. Endotracheal and nasogastric tubes are unchanged in position. No pneumothorax is noted. Right lung is clear. Mild left basilar atelectasis or infiltrate is noted. Bony thorax is unremarkable. IMPRESSION: Stable support apparatus. Mild left basilar atelectasis or infiltrate is noted. Electronically Signed   By: Marijo Conception M.D.   On: 07/18/2020 08:02   DG Chest Portable 1 View  Result Date: 07/16/2020 CLINICAL DATA:  Post intubation. EXAM: PORTABLE CHEST 1 VIEW COMPARISON:  July 16, 2020 FINDINGS: Endotracheal tube  now approximately 2.7 cm from the carina. Heart size top normal accentuated by portable technique and low lung volumes. Linear opacities at the LEFT lung base. Vascular crowding. No lobar consolidation or sign of pleural effusion. On limited assessment no acute skeletal process. IMPRESSION: 1. Endotracheal tube now approximately 2.7 cm from the carina, retracted approximately 1 cm since the prior study. 2. Low lung volumes with basilar atelectasis. 3. No signs of consolidation or effusion. Electronically Signed   By: Zetta Bills M.D.   On: 07/16/2020 16:20   DG Chest Port 1 View  Result Date: 07/16/2020 CLINICAL DATA:  Hypoxia EXAM: PORTABLE CHEST 1 VIEW COMPARISON:  May 04, 2020. FINDINGS: Endotracheal tube tip is 1.6 cm above the carina. No pneumothorax. There is mild left base atelectasis. Lungs elsewhere are clear. Heart size and pulmonary vascularity are normal. No adenopathy. No bone lesions. IMPRESSION: Endotracheal tube as described without pneumothorax. Mild left base atelectasis. Lungs elsewhere clear. Cardiac silhouette normal. Electronically Signed   By: Lowella Grip III M.D.   On: 07/16/2020 16:11   DG Abd Portable 1V  Result Date: 07/19/2020 CLINICAL DATA:  Check gastric catheter placement EXAM: PORTABLE ABDOMEN - 1 VIEW COMPARISON:  Chest x-ray from earlier in the same  day. FINDINGS: Gastric catheter is now seen with the tip in the distal stomach. Scattered large and small bowel gas is noted. No obstructive changes are seen. IMPRESSION: Gastric catheter with the tip in the distal stomach. Electronically Signed   By: Inez Catalina M.D.   On: 07/19/2020 22:48   DG Abd Portable 1V  Result Date: 07/17/2020 CLINICAL DATA:  OG tube placement EXAM: PORTABLE ABDOMEN - 1 VIEW COMPARISON:  Ultrasound 07/16/2020, radiograph 07/16/2020 FINDINGS: Transesophageal tube tip and side port distal to the GE junction, likely terminating near the gastric antrum. Air-filled though nondistended loops of bowel are seen in the abdomen. No discernible evidence of free air though limited assessment on this supine radiograph. Atelectatic changes present in the included lung bases. Degenerative changes in the spine and pelvis. IMPRESSION: 1. Transesophageal tube tip and side port distal to the GE junction, likely terminating near the gastric antrum. Electronically Signed   By: Lovena Le M.D.   On: 07/17/2020 23:49   DG Abd Portable 1V  Result Date: 07/16/2020 CLINICAL DATA:  Orogastric tube placement EXAM: PORTABLE ABDOMEN - 1 VIEW COMPARISON:  None. FINDINGS: Orogastric tube tip and side port are in the stomach. There is no bowel dilatation or air-fluid level to suggest bowel obstruction. No free air. Lung bases clear. IMPRESSION: Orogastric tube tip and side port in stomach. No bowel obstruction or free air evident. Lung bases clear. Electronically Signed   By: Lowella Grip III M.D.   On: 07/16/2020 16:35   DG Swallowing Func-Speech Pathology  Result Date: 07/31/2020 Objective Swallowing Evaluation: Type of Study: MBS-Modified Barium Swallow Study  Patient Details Name: [name] MRN: [id number] Date of Birth: [birth year] Today's Date: 07/31/2020 Time: SLP Start Time (ACUTE ONLY): 2297 -SLP Stop Time (ACUTE ONLY): 9892 SLP Time Calculation (min) (ACUTE ONLY): 14 min Past Medical  History: Past Medical History: Diagnosis Date . Alcohol abuse  . Anxiety   at age 53 . Depression   at age 84 . GERD (gastroesophageal reflux disease)  . Hypertension  . Psychosis ([place])  . PTSD (post-traumatic stress disorder)  . Schizoaffective disorder  . Seizure disorder ([place])   related to etoh seizure . Symptomatic anemia  Past Surgical History: Past Surgical History: Procedure Laterality Date .  ESOPHAGOGASTRODUODENOSCOPY (EGD) WITH PROPOFOL N/A 05/05/2020  Procedure: ESOPHAGOGASTRODUODENOSCOPY (EGD) WITH PROPOFOL;  Surgeon: [name], MD;  Location: [facility];  Service: Endoscopy;  Laterality: N/A; . FOOT SURGERY    right . HAND SURGERY   HPI: 49 y.o. male admitted with AMS with encephalopathy and alcholic hepatitis. Intubated in ED on 11/29-12/2, reintubated 12/2-12/7.  PMHx: alcoholism, HTN, GERD, schizoaffective disorder, PTSD. MBS 12/9: mild pharyngeal dysphagia with trace inconsistent silent aspiration from premature spill and mistimed laryngeal closure. Thin liquids were penetrated to cords with cup and aspirated with straw. Sensation was not consistent and he demonstrated both cough and no cough in response to cord penetration and aspiration. New order received 12/12 with comment "Pt feels that he can swallow thin liquids now and had drank some Gatorade upon assessment and tolerated well." Repeat MBS for possible liquid upgrade.  No data recorded Assessment / Plan / Recommendation CHL IP CLINICAL IMPRESSIONS 07/31/2020 Clinical Impression Oropharyngeal swallow function has improved specifically in regards to oral cohesion, propulsion and onset of swallows. He propelled thin boluses orally with timely initiation of laryngeal closure. Difficulty controlling dual consistencies with barium pill and thin. Barium partially spilled to pyriforms wtih thin penetrating vocal cords as pill entered esophagus without awareness. Mastication was slow, deliberate functional. Recommended upgrade to regular  texture, thin liquids, straws allowed and pills whole in puree (RN updated). Brief follow up with ST. SLP Visit Diagnosis Dysphagia, unspecified (R13.10) Attention and concentration deficit following -- Frontal lobe and executive function deficit following -- Impact on safety and function Mild aspiration risk   CHL IP TREATMENT RECOMMENDATION 07/31/2020 Treatment Recommendations Therapy as outlined in treatment plan below   Prognosis 07/31/2020 Prognosis for Safe Diet Advancement Good Barriers to Reach Goals (No Data) Barriers/Prognosis Comment -- CHL IP DIET RECOMMENDATION 07/31/2020 SLP Diet Recommendations Regular solids;Thin liquid Liquid Administration via Cup;Straw Medication Administration Whole meds with puree Compensations Slow rate;Small sips/bites Postural Changes Seated upright at 90 degrees   CHL IP OTHER RECOMMENDATIONS 07/31/2020 Recommended Consults -- Oral Care Recommendations Oral care BID Other Recommendations --   CHL IP FOLLOW UP RECOMMENDATIONS 07/31/2020 Follow up Recommendations Inpatient Rehab   CHL IP FREQUENCY AND DURATION 07/31/2020 Speech Therapy Frequency (ACUTE ONLY) min 2x/week Treatment Duration 2 weeks      CHL IP ORAL PHASE 07/31/2020 Oral Phase Impaired Oral - Pudding Teaspoon -- Oral - Pudding Cup -- Oral - Honey Teaspoon -- Oral - Honey Cup -- Oral - Nectar Teaspoon -- Oral - Nectar Cup -- Oral - Nectar Straw -- Oral - Thin Teaspoon -- Oral - Thin Cup WFL Oral - Thin Straw WFL Oral - Puree -- Oral - Mech Soft -- Oral - Regular Impaired mastication Oral - Multi-Consistency -- Oral - Pill -- Oral Phase - Comment --  CHL IP PHARYNGEAL PHASE 07/31/2020 Pharyngeal Phase Impaired Pharyngeal- Pudding Teaspoon -- Pharyngeal -- Pharyngeal- Pudding Cup -- Pharyngeal -- Pharyngeal- Honey Teaspoon -- Pharyngeal -- Pharyngeal- Honey Cup -- Pharyngeal -- Pharyngeal- Nectar Teaspoon -- Pharyngeal -- Pharyngeal- Nectar Cup -- Pharyngeal -- Pharyngeal- Nectar Straw -- Pharyngeal -- Pharyngeal-  Thin Teaspoon -- Pharyngeal -- Pharyngeal- Thin Cup Penetration/Aspiration during swallow Pharyngeal Material enters airway, CONTACTS cords and not ejected out Pharyngeal- Thin Straw WFL Pharyngeal Material does not enter airway Pharyngeal- Puree -- Pharyngeal -- Pharyngeal- Mechanical Soft -- Pharyngeal -- Pharyngeal- Regular WFL Pharyngeal -- Pharyngeal- Multi-consistency -- Pharyngeal -- Pharyngeal- Pill -- Pharyngeal -- Pharyngeal Comment --  CHL IP CERVICAL ESOPHAGEAL PHASE 07/31/2020 Cervical Esophageal Phase  WFL Pudding Teaspoon -- Pudding Cup -- Honey Teaspoon -- Honey Cup -- Nectar Teaspoon -- Nectar Cup -- Nectar Straw -- Thin Teaspoon -- Thin Cup -- Thin Straw -- Puree -- Mechanical Soft -- Regular -- Multi-consistency -- Pill -- Cervical Esophageal Comment -- [name] 07/31/2020, 2:57 PM Orbie Pyo Litaker M.Ed Actor Pager [phone] Office [phone]              DG Swallowing Func-Speech Pathology  Result Date: 07/26/2020 Objective Swallowing Evaluation: Type of Study: MBS-Modified Barium Swallow Study  Patient Details Name: [name] MRN: [id number] Date of Birth: Oct 02, 1970 Today's Date: 07/26/2020 Time: SLP Start Time (ACUTE ONLY): 6378 -SLP Stop Time (ACUTE ONLY): 5885 SLP Time Calculation (min) (ACUTE ONLY): 20 min Past Medical History: Past Medical History: Diagnosis Date . Alcohol abuse  . Anxiety   at age 62 . Depression   at age 48 . GERD (gastroesophageal reflux disease)  . Hypertension  . Psychosis ([place])  . PTSD (post-traumatic stress disorder)  . Schizoaffective disorder  . Seizure disorder ([place])   related to etoh seizure . Symptomatic anemia  Past Surgical History: Past Surgical History: Procedure Laterality Date . ESOPHAGOGASTRODUODENOSCOPY (EGD) WITH PROPOFOL N/A 05/05/2020  Procedure: ESOPHAGOGASTRODUODENOSCOPY (EGD) WITH PROPOFOL;  Surgeon: [name], MD;  Location: [facility];  Service: Endoscopy;  Laterality: N/A; . FOOT SURGERY     right . HAND SURGERY   HPI: 49 y.o. male admitted with AMS with encephalopathy and alcholic hepatitis. Intubated in ED on 11/29-12/2, reintubated 12/2-12/7.  PMHx: alcoholism, HTN, GERD, schizoaffective disorder, PTSD.  No data recorded Assessment / Plan / Recommendation CHL IP CLINICAL IMPRESSIONS 07/26/2020 Clinical Impression Pt demonstrates mild pharyngeal dysphagia with trace inconsistent aspiration from premature spill and mistimed laryngeal closure. Although it is within normal paramerters for thin liquid to reach the pyriform sinuses, his arytenoid cartilidges had not begun moving to epiglottic petiole to close larynx and thin was penetrated to cords with cup and aspirated with straw. Sensation was not consistent and he demonstrated both cough and no cough in response to cord penetration and aspiration. After first sip consumed during study with thin, he produced a strong cough however no barium observed in vestibule or below cords. Therapist introduced chin tuck then breath hold strategy/maneuver. Chin tuck not effective and no laryngeal intrusion during breath hold. Pt requested to. He was mildy dyspneic and hesitated prior to transiting thin perhaps in attemtps to coordinate respiration and swallow. Although cognition is improving his processing is mildly delayed and recommend he continue nectar thick, Dys 3 texture. ST will target breath hold or supraglottic swallow as a strategy with thin. Pills whole in puree and he required assist for self feeding. SLP Visit Diagnosis Dysphagia, pharyngeal phase (R13.13) Attention and concentration deficit following -- Frontal lobe and executive function deficit following -- Impact on safety and function Mild aspiration risk;Moderate aspiration risk   CHL IP TREATMENT RECOMMENDATION 07/26/2020 Treatment Recommendations Therapy as outlined in treatment plan below   Prognosis 07/26/2020 Prognosis for Safe Diet Advancement Good Barriers to Reach Goals --  Barriers/Prognosis Comment -- CHL IP DIET RECOMMENDATION 07/26/2020 SLP Diet Recommendations Dysphagia 3 (Mech soft) solids;Nectar thick liquid Liquid Administration via Cup;Straw Medication Administration Whole meds with puree Compensations Slow rate;Small sips/bites;Clear throat intermittently Postural Changes Seated upright at 90 degrees   CHL IP OTHER RECOMMENDATIONS 07/26/2020 Recommended Consults -- Oral Care Recommendations Oral care BID Other Recommendations --   CHL IP FOLLOW UP RECOMMENDATIONS 07/26/2020 Follow up Recommendations Inpatient  Rehab   CHL IP FREQUENCY AND DURATION 07/26/2020 Speech Therapy Frequency (ACUTE ONLY) min 2x/week Treatment Duration 2 weeks      CHL IP ORAL PHASE 07/26/2020 Oral Phase Impaired Oral - Pudding Teaspoon -- Oral - Pudding Cup -- Oral - Honey Teaspoon -- Oral - Honey Cup -- Oral - Nectar Teaspoon -- Oral - Nectar Cup -- Oral - Nectar Straw -- Oral - Thin Teaspoon -- Oral - Thin Cup Delayed oral transit Oral - Thin Straw Premature spillage Oral - Puree -- Oral - Mech Soft -- Oral - Regular Other (Comment) Oral - Multi-Consistency -- Oral - Pill -- Oral Phase - Comment --  CHL IP PHARYNGEAL PHASE 07/26/2020 Pharyngeal Phase Impaired Pharyngeal- Pudding Teaspoon -- Pharyngeal -- Pharyngeal- Pudding Cup -- Pharyngeal -- Pharyngeal- Honey Teaspoon -- Pharyngeal -- Pharyngeal- Honey Cup -- Pharyngeal -- Pharyngeal- Nectar Teaspoon -- Pharyngeal -- Pharyngeal- Nectar Cup -- Pharyngeal -- Pharyngeal- Nectar Straw -- Pharyngeal -- Pharyngeal- Thin Teaspoon -- Pharyngeal -- Pharyngeal- Thin Cup Penetration/Aspiration during swallow;Compensatory strategies attempted (with notebox);Delayed swallow initiation-pyriform sinuses Pharyngeal Material enters airway, CONTACTS cords and not ejected out;Material enters airway, passes BELOW cords then ejected out Pharyngeal- Thin Straw Penetration/Aspiration during swallow Pharyngeal Material enters airway, passes BELOW cords without attempt by  patient to eject out (silent aspiration) Pharyngeal- Puree -- Pharyngeal -- Pharyngeal- Mechanical Soft -- Pharyngeal -- Pharyngeal- Regular WFL Pharyngeal -- Pharyngeal- Multi-consistency -- Pharyngeal -- Pharyngeal- Pill -- Pharyngeal -- Pharyngeal Comment --  CHL IP CERVICAL ESOPHAGEAL PHASE 07/26/2020 Cervical Esophageal Phase WFL Pudding Teaspoon -- Pudding Cup -- Honey Teaspoon -- Honey Cup -- Nectar Teaspoon -- Nectar Cup -- Nectar Straw -- Thin Teaspoon -- Thin Cup -- Thin Straw -- Puree -- Mechanical Soft -- Regular -- Multi-consistency -- Pill -- Cervical Esophageal Comment -- [name] 07/26/2020, 3:31 PM  Orbie Pyo Litaker M.Ed Actor Pager [phone] Office [phone]             Overnight EEG with video  Result Date: 07/17/2020 Lora Havens, MD     07/18/2020  8:38 AM Patient Name: [name] MRN: [id number] Epilepsy Attending: Lora Havens Referring Physician/Provider: Dr Iona Beard Duration: 07/16/2020 1808 to 07/17/2020 1808  Patient history: 49 year old male with history of alcohol abuse, substance abuse was found down and noted to be encephalopathic on arrival.  EEG to evaluate for seizures.  Level of alertness: Comatose  AEDs during EEG study: Propofol  Technical aspects: This EEG study was done with scalp electrodes positioned according to the 10-20 International system of electrode placement. Electrical activity was acquired at a sampling rate of 500Hz  and reviewed with a high frequency filter of 70Hz  and a low frequency filter of 1Hz . EEG data were recorded continuously and digitally stored.  Description: EEG showed continuous generalized rhythmic 2 to 3 Hz delta slowing, at times with triphasic morphology.  Hyperventilation and photic stimulation were not performed.    ABNORMALITY -Continuous slow, generalized  IMPRESSION: This study is suggestive of severe diffuse encephalopathy, nonspecific etiology but could be secondary to  sedation.  No seizures or epileptiform discharges were seen throughout the recording.  Lora Havens   Portable EEG  Result Date: 07/17/2020 Lora Havens, MD     07/17/2020  9:26 AM Patient Name: [name] MRN: [id number] Epilepsy Attending: Lora Havens Referring Physician/Provider: Dr Donnetta Simpers Date: 07/16/2020 Duration: 23.45 mins Patient history: 49 year old male with history of alcohol abuse, substance abuse was found down and noted to  be encephalopathic on arrival.  EEG to evaluate for seizures. Level of alertness: Comatose AEDs during EEG study: Propofol Technical aspects: This EEG study was done with scalp electrodes positioned according to the 10-20 International system of electrode placement. Electrical activity was acquired at a sampling rate of 500Hz  and reviewed with a high frequency filter of 70Hz  and a low frequency filter of 1Hz . EEG data were recorded continuously and digitally stored. Description: EEG showed continuous generalized rhythmic 2 to 3 Hz delta slowing.  Hyperventilation and photic stimulation were not performed.   ABNORMALITY -Continuous slow, generalized IMPRESSION: This study is suggestive of severe diffuse encephalopathy, nonspecific etiology but could be secondary to sedation.  No seizures or epileptiform discharges were seen throughout the recording. Priyanka O Yadav   [place] EKG SITE RITE  Result Date: 07/17/2020 If Site Rite image not attached, placement could not be confirmed due to current cardiac rhythm.  US Abdomen Limited RUQ (LIVER/GB)  Result Date: 07/16/2020 CLINICAL DATA:  Spontaneous bacterial peritonitis EXAM: ULTRASOUND ABDOMEN LIMITED RIGHT UPPER QUADRANT COMPARISON:  Radiograph 07/16/2020, ultrasound 05/03/2020 FINDINGS: Gallbladder: Mild gallbladder wall thickening is nonspecific in the setting of intrinsic liver disease. No visible calcified gallstones though biliary sludge is present. Trace pericholecystic fluid may be  redistributed from small volume ascites. Unable to assess the sonographic Murphy sign in this intubated patient. Common bile duct: Diameter: 4.5 mm, nondistended though poorly visualized. Liver: Diffusely increased hepatic echogenicity with coarsened hepatic echotexture and a nodular liver surface contour. No focal liver lesion. Recanalization of the umbilical vein is noted. No intrahepatic biliary ductal dilatation. The portal vein is difficult to fully visualize given diminished through transmission of the liver. Some hepatopetal flow is noted on the color Doppler assessment though waveforms appear diminished particularly within diastole. Other: Small volume ascites. IMPRESSION: 1. Echogenic and nodular liver compatible with stigmata cirrhosis and likely fatty infiltration with features suggesting portal venous hypertension including small volume ascites, recanalization of the umbilical vein, and diminished portable velocities though difficult to fully assess given diminished through transmission. 2. Gallbladder sludge without visible cholelithiasis. Mild gallbladder wall thickening is nonspecific in the setting of intrinsic liver disease and trace pericholecystic fluid may be redistributed from small volume ascites though an acute cholecystitis is not fully excluded. If there is persisting concern, HIDA could be obtained. Electronically Signed   By: Lovena Le M.D.   On: 07/16/2020 23:07     Time coordinating discharge: Over 30 minutes    Ryan Dee, MD  [facility] 08/06/2020, 1:58 PM

## 2020-08-06 NOTE — Progress Notes (Signed)
Attempted to call report at this time. No response.

## 2020-08-06 NOTE — TOC Transition Note (Addendum)
Transition of Care [facility]) - CM/SW Discharge Note   Patient Details  Name: [name] MRN: [id number] Date of Birth: March 15, 1971  Transition of Care [facility]) CM/SW Contact:  Coralee Pesa, [place] Phone Number: 08/06/2020, 1:41 PM   Clinical Narrative:    Nurse to call report to [phone]. Rm# [id number] IOMB Id [id number] Approved for 3 days, nexte review date is 12/ 22 CM is Jerrylyn.  Final next level of care: [place] Barriers to Discharge: Barriers Resolved   Patient Goals and CMS Choice        Discharge Placement              Patient chooses bed at:  [facility]) Patient to be transferred to facility by: [place] Name of family member notified: Katharine Look Patient and family notified of of transfer: 08/06/20  Discharge Plan and Services                                     Social Determinants of Health (SDOH) Interventions     Readmission Risk Interventions Readmission Risk Prevention Plan 01/15/2019  Medication Screening Complete  Transportation Screening Complete  Some recent data might be hidden

## 2020-08-06 NOTE — Progress Notes (Signed)
Report called to [place], Therapist, sports at [facility].

## 2020-08-06 NOTE — Care Management Important Message (Signed)
Important Message  Patient Details  Name: [name] MRN: [id number] Date of Birth: 30-Jun-1971   Medicare Important Message Given:  Yes     Alayjah Boehringer 08/06/2020, 4:07 PM

## 2020-08-14 ENCOUNTER — Emergency Department (HOSPITAL_COMMUNITY)
Admission: EM | Admit: 2020-08-14 | Discharge: 2020-08-15 | Disposition: A | Discharge status: Home / Self Care | Payer: Medicare Other | Attending: Emergency Medicine | Admitting: Emergency Medicine

## 2020-08-14 ENCOUNTER — Other Ambulatory Visit: Payer: Self-pay

## 2020-08-14 DIAGNOSIS — E876 Hypokalemia: Secondary | ICD-10-CM | POA: Insufficient documentation

## 2020-08-14 DIAGNOSIS — L538 Other specified erythematous conditions: Secondary | ICD-10-CM | POA: Diagnosis present

## 2020-08-14 DIAGNOSIS — Z79899 Other long term (current) drug therapy: Secondary | ICD-10-CM | POA: Insufficient documentation

## 2020-08-14 DIAGNOSIS — I10 Essential (primary) hypertension: Secondary | ICD-10-CM | POA: Insufficient documentation

## 2020-08-14 DIAGNOSIS — F17210 Nicotine dependence, cigarettes, uncomplicated: Secondary | ICD-10-CM | POA: Insufficient documentation

## 2020-08-14 DIAGNOSIS — R10817 Generalized abdominal tenderness: Secondary | ICD-10-CM | POA: Diagnosis not present

## 2020-08-14 DIAGNOSIS — R17 Unspecified jaundice: Secondary | ICD-10-CM | POA: Insufficient documentation

## 2020-08-14 DIAGNOSIS — L03116 Cellulitis of left lower limb: Secondary | ICD-10-CM | POA: Diagnosis not present

## 2020-08-14 LAB — CBC
HCT: 25.5 % — ABNORMAL LOW (ref 39.0–52.0)
Hemoglobin: 7.8 g/dL — ABNORMAL LOW (ref 13.0–17.0)
MCH: 33.5 pg (ref 26.0–34.0)
MCHC: 30.6 g/dL (ref 30.0–36.0)
MCV: 109.4 fL — ABNORMAL HIGH (ref 80.0–100.0)
Platelets: 262 10*3/uL (ref 150–400)
RBC: 2.33 MIL/uL — ABNORMAL LOW (ref 4.22–5.81)
RDW: 15.9 % — ABNORMAL HIGH (ref 11.5–15.5)
WBC: 12.8 10*3/uL — ABNORMAL HIGH (ref 4.0–10.5)
nRBC: 0 % (ref 0.0–0.2)

## 2020-08-14 LAB — COMPREHENSIVE METABOLIC PANEL
ALT: 62 U/L — ABNORMAL HIGH (ref 0–44)
AST: 95 U/L — ABNORMAL HIGH (ref 15–41)
Albumin: 1.9 g/dL — ABNORMAL LOW (ref 3.5–5.0)
Alkaline Phosphatase: 215 U/L — ABNORMAL HIGH (ref 38–126)
Anion gap: 11 (ref 5–15)
BUN: 10 mg/dL (ref 6–20)
CO2: 21 mmol/L — ABNORMAL LOW (ref 22–32)
Calcium: 8 mg/dL — ABNORMAL LOW (ref 8.9–10.3)
Chloride: 104 mmol/L (ref 98–111)
Creatinine, Ser: 1.04 mg/dL (ref 0.61–1.24)
GFR, Estimated: >60 mL/min (ref >60)
Glucose, Bld: 118 mg/dL — ABNORMAL HIGH (ref 70–99)
Potassium: 2.9 mmol/L — ABNORMAL LOW (ref 3.5–5.1)
Sodium: 136 mmol/L (ref 135–145)
Total Bilirubin: 7.4 mg/dL — ABNORMAL HIGH (ref 0.3–1.2)
Total Protein: 5.6 g/dL — ABNORMAL LOW (ref 6.5–8.1)

## 2020-08-14 LAB — LIPASE, BLOOD: Lipase: 35 U/L (ref 11–51)

## 2020-08-14 NOTE — ED Triage Notes (Addendum)
Pt here via EMS from [facility] for eval of redness on L thigh. Staff also reports abnormal lab values but was unable to provide what lab values are abnormal. Pt also reports abdominal pain and nausea. Appears jaundiced.

## 2020-08-15 MED ORDER — POTASSIUM CHLORIDE 20 MEQ/15ML (10%) PO SOLN: 20.0000 meq | Freq: Two times a day (BID) | ORAL | 0 refills | Status: DC

## 2020-08-15 MED ORDER — CEPHALEXIN 250 MG PO CAPS
500.0000 mg | ORAL_CAPSULE | Freq: Once | ORAL | Status: AC
  Administered 2020-08-15: 500 mg via ORAL
  Filled 2020-08-15: qty 2

## 2020-08-15 MED ORDER — POTASSIUM CHLORIDE 20 MEQ PO PACK
40.0000 meq | PACK | Freq: Once | ORAL | Status: AC
  Administered 2020-08-15: 40 meq via ORAL
  Filled 2020-08-15: qty 2

## 2020-08-15 MED ORDER — CEPHALEXIN 500 MG PO CAPS: 500.0000 mg | ORAL_CAPSULE | Freq: Four times a day (QID) | ORAL | 0 refills | Status: AC

## 2020-08-15 NOTE — ED Notes (Signed)
Called [facility] for transport back to [facility]

## 2020-08-15 NOTE — ED Notes (Signed)
[facility] in facility and stated they would not be able to take patient because he stated he could pivot. Deanna Artis at [facility] was made aware and she stated she was a traveler and did not know anything about transportation.

## 2020-08-15 NOTE — ED Notes (Signed)
Talked to [name] at [facility] and she stated that he was sent out for an emergency and it was on the ER to get him back there. [name] stated they do not have a Zenaida Niece to transport him back to the facility. Attempted to talk with administration and [name] stated no one was there at this time.

## 2020-08-15 NOTE — ED Provider Notes (Signed)
[facility] EMERGENCY DEPARTMENT Provider Note   CSN: [id number] Arrival date & time: 08/14/20  1607     History No chief complaint on file.   [name] is a 49 y.o. male.  Patient to ED from [facility] where he has been since a 12/20 discharge from a prolonged hospital stay for cirrhosis, encephalopathy, septic shock. He was found to have a concerning area of redness to anterior left thigh that reportedly started yesterday. The patient denies pain at the site. He is not aware of any injury. No known fever. Per EMS, the facility was also concerned about abnormal labs but details were not given by facility staff.  The history is provided by the patient and the EMS personnel. No language interpreter was used.       Past Medical History:  Diagnosis Date  . Alcohol abuse   . Anxiety    at age 44  . Depression    at age 57  . GERD (gastroesophageal reflux disease)   . Hypertension   . Psychosis (HCC)   . PTSD (post-traumatic stress disorder)   . Schizoaffective disorder   . Seizure disorder (HCC)    related to etoh seizure  . Symptomatic anemia     Patient Active Problem List   Diagnosis Date Noted  . Alcoholic hepatitis with ascites   . Acute respiratory failure (HCC)   . Pressure injury of skin 07/17/2020  . Encephalopathy, hepatic (HCC) 07/16/2020  . Alcoholic cirrhosis of liver with ascites (HCC)   . Hypomagnesemia 05/03/2020  . Hyperbilirubinemia 05/03/2020  . D-dimer, elevated 05/03/2020  . Alcoholism (HCC) 05/03/2020  . Rhabdomyolysis 03/06/2019  . Alcoholic hepatitis without ascites 03/06/2019  . Alcohol withdrawal (HCC) 01/15/2019  . HTN (hypertension) 01/10/2019  . Fall 01/10/2019  . Alcohol intoxication (HCC) 01/10/2019  . Schizoaffective disorder, bipolar type (HCC) 06/02/2018  . Alcohol dependence with uncomplicated withdrawal (HCC) 02/22/2016  . Alcohol-induced mood disorder (HCC) 02/22/2016  . Gout 10/22/2015  . GERD  (gastroesophageal reflux disease) 10/22/2015  . Alcohol withdrawal seizure with complication (HCC) 09/30/2015  . Schizoaffective disorder, depressive type (HCC)   . History of alcohol abuse   . Tobacco dependence due to cigarettes 11/20/2014  . Alcohol abuse with intoxication (HCC)   . Hyponatremia 10/18/2014  . Abnormal LFTs   . Alcohol abuse 02/09/2014  . Post traumatic stress disorder (PTSD) 11/03/2011  . Hypokalemia 07/02/2011    Past Surgical History:  Procedure Laterality Date  . ESOPHAGOGASTRODUODENOSCOPY (EGD) WITH PROPOFOL N/A 05/05/2020   Procedure: ESOPHAGOGASTRODUODENOSCOPY (EGD) WITH PROPOFOL;  Surgeon: Napoleon Form, MD;  Location: [facility];  Service: Endoscopy;  Laterality: N/A;  . FOOT SURGERY     right  . HAND SURGERY         Family History  Problem Relation Age of Onset  . Alcoholism Mother   . CAD Father   . Depression Brother     Social History   Tobacco Use  . Smoking status: Current Every Day Smoker    Packs/day: 1.50    Years: 30.00    Pack years: 45.00    Types: Cigarettes  . Smokeless tobacco: Never Used  Vaping Use  . Vaping Use: Every day  Substance Use Topics  . Alcohol use: Yes    Alcohol/week: 0.0 standard drinks    Comment: drinking daily   . Drug use: Yes    Frequency: 1.0 times per week    Types: Marijuana    Comment: THC 2 to  3 times per month. last use 09/24/2015    Home Medications Prior to Admission medications   Medication Sig Start Date End Date Taking? Authorizing Provider  B Complex-C (B-COMPLEX WITH VITAMIN C) tablet Take 1 tablet by mouth daily. 08/07/20   [name], MD  folic acid (FOLVITE) 1 MG tablet Take 1 tablet (1 mg total) by mouth daily. 08/07/20   [name], MD  furosemide (LASIX) 40 MG tablet Take 1 tablet (40 mg total) by mouth daily. 08/07/20   [name], MD  lactulose (CHRONULAC) 10 GM/15ML solution Take 45 mLs (30 g total) by mouth 3 (three) times daily. 08/06/20   [name],  MD  midodrine (PROAMATINE) 10 MG tablet Take 1 tablet (10 mg total) by mouth 3 (three) times daily with meals. 08/06/20   [name], MD  Multiple Vitamin (MULTIVITAMIN WITH MINERALS) TABS tablet Take 1 tablet by mouth daily. 05/08/20   [place] Eric J, DO  pantoprazole (PROTONIX) 40 MG tablet Take 1 tablet (40 mg total) by mouth 2 (two) times daily. 08/06/20   [name], MD  polyethylene glycol (MIRALAX / GLYCOLAX) 17 g packet Take 17 g by mouth daily as needed for moderate constipation. 08/06/20   [name], MD  QUEtiapine (SEROQUEL) 50 MG tablet Take 1 tablet (50 mg total) by mouth at bedtime. 06/06/20 07/06/20  Nicolette Bang, DO  rifaximin (XIFAXAN) 550 MG TABS tablet Take 1 tablet (550 mg total) by mouth 2 (two) times daily. 08/06/20   [name], MD  sertraline (ZOLOFT) 25 MG tablet Take 1 tablet (25 mg total) by mouth daily. 07/11/20   Nicolette Bang, DO  spironolactone (ALDACTONE) 100 MG tablet Take 1 tablet (100 mg total) by mouth daily. 08/07/20   [name], MD  thiamine 100 MG tablet Take 1 tablet (100 mg total) by mouth daily. 08/07/20   [name], MD  vitamin C (VITAMIN C) 250 MG tablet Take 1 tablet (250 mg total) by mouth 2 (two) times daily. 08/06/20   [name], MD    Allergies    Wellbutrin [bupropion]  Review of Systems   Review of Systems  Constitutional: Negative for chills and fever.  HENT: Negative.   Respiratory: Negative.   Cardiovascular: Negative.   Gastrointestinal: Negative.   Musculoskeletal: Negative.        No leg pain  Skin: Positive for color change.  Neurological: Negative.     Physical Exam Updated Vital Signs BP 118/81   Pulse 80   Temp 98 F (36.7 C) (Oral)   Resp 18   Ht 6' (1.829 m)   Wt 99.8 kg   SpO2 100%   BMI 29.84 kg/m   Physical Exam Vitals and nursing note reviewed.  Constitutional:      General: He is not in acute distress.    Appearance: He is obese. He is  ill-appearing.  HENT:     Head: Normocephalic.     Mouth/Throat:     Mouth: Mucous membranes are moist.  Eyes:     General: Scleral icterus present.  Cardiovascular:     Rate and Rhythm: Normal rate and regular rhythm.     Heart sounds: No murmur heard.   Pulmonary:     Effort: Pulmonary effort is normal. No respiratory distress.  Abdominal:     General: There is distension.     Palpations: Abdomen is soft.     Comments: Generalized tenderness, "not new"  Musculoskeletal:        General:  No swelling. Normal range of motion.     Cervical back: Normal range of motion and neck supple.     Comments: Mid-shaft anterior left thigh has a large area of redness that is warm to touch. No induration or fluctuance. Nontender.  Skin:    Coloration: Skin is jaundiced.  Neurological:     General: No focal deficit present.     Mental Status: He is alert and oriented to person, place, and time.     ED Results / Procedures / Treatments   Labs (all labs ordered are listed, but only abnormal results are displayed) Labs Reviewed  COMPREHENSIVE METABOLIC PANEL - Abnormal; Notable for the following components:      Result Value   Potassium 2.9 (*)    CO2 21 (*)    Glucose, Bld 118 (*)    Calcium 8.0 (*)    Total Protein 5.6 (*)    Albumin 1.9 (*)    AST 95 (*)    ALT 62 (*)    Alkaline Phosphatase 215 (*)    Total Bilirubin 7.4 (*)    All other components within normal limits  CBC - Abnormal; Notable for the following components:   WBC 12.8 (*)    RBC 2.33 (*)    Hemoglobin 7.8 (*)    HCT 25.5 (*)    MCV 109.4 (*)    RDW 15.9 (*)    All other components within normal limits  LIPASE, BLOOD  URINALYSIS, ROUTINE W REFLEX MICROSCOPIC   Results for orders placed or performed during the hospital encounter of 08/14/20  Lipase, blood  Result Value Ref Range   Lipase 35 11 - 51 U/L  Comprehensive metabolic panel  Result Value Ref Range   Sodium 136 135 - 145 mmol/L   Potassium 2.9  (L) 3.5 - 5.1 mmol/L   Chloride 104 98 - 111 mmol/L   CO2 21 (L) 22 - 32 mmol/L   Glucose, Bld 118 (H) 70 - 99 mg/dL   BUN 10 6 - 20 mg/dL   Creatinine, Ser 1.04 0.61 - 1.24 mg/dL   Calcium 8.0 (L) 8.9 - 10.3 mg/dL   Total Protein 5.6 (L) 6.5 - 8.1 g/dL   Albumin 1.9 (L) 3.5 - 5.0 g/dL   AST 95 (H) 15 - 41 U/L   ALT 62 (H) 0 - 44 U/L   Alkaline Phosphatase 215 (H) 38 - 126 U/L   Total Bilirubin 7.4 (H) 0.3 - 1.2 mg/dL   GFR, Estimated >60 >60 mL/min   Anion gap 11 5 - 15  CBC  Result Value Ref Range   WBC 12.8 (H) 4.0 - 10.5 K/uL   RBC 2.33 (L) 4.22 - 5.81 MIL/uL   Hemoglobin 7.8 (L) 13.0 - 17.0 g/dL   HCT 25.5 (L) 39.0 - 52.0 %   MCV 109.4 (H) 80.0 - 100.0 fL   MCH 33.5 26.0 - 34.0 pg   MCHC 30.6 30.0 - 36.0 g/dL   RDW 15.9 (H) 11.5 - 15.5 %   Platelets 262 150 - 400 K/uL   nRBC 0.0 0.0 - 0.2 %    EKG None  Radiology No results found.  Procedures Procedures (including critical care time)  Medications Ordered in ED Medications  potassium chloride (KLOR-CON) packet 40 mEq (has no administration in time range)    ED Course  I have reviewed the triage vital signs and the nursing notes.  Pertinent labs & imaging results that were available during my care of the  patient were reviewed by me and considered in my medical decision making (see chart for details).    MDM Rules/Calculators/A&P                          Patient to ED with concern by [facility] of an area of redness that developed on his left thigh yesterday. There was also a reported concern by the facility to EMS about an abnormal lab study but details were unavailable. Attempt to contact [facility] unsuccessful.   Chart reviewed. The patient left the hospital on 12/20 after a long stay for multiple medical problems, discharged to the care of [facility] for rehab/strengthening/return to independence prior to discharge home. Labs obtained today in the ED and compared to recent discharge labs and do  not show any significant change. His potassium is low at 2.9, similar, but will supplement here in the ED with oral potassium.   The area to the left thigh will be treated as mild cellulitis. No concerning leukocytosis. No fever. No abscess. He will be started on Keflex.   He is felt appropriate to discharge back to [place].  Final Clinical Impression(s) / ED Diagnoses Final diagnoses:  None   1. Mild cellulitis, left thigh 2. Hypokalemia   Rx / DC Orders ED Discharge Orders    None       Charlann Lange, PA-C 08/15/20 0301    Fatima Blank, MD 08/15/20 [phone]

## 2020-08-15 NOTE — ED Notes (Signed)
Attempted to call [facility] so they could arrange transportation, no answer x3.

## 2020-08-15 NOTE — ED Notes (Signed)
[name] from [place] was given report and made aware that [facility] will be transporting patient back to facility

## 2020-08-15 NOTE — ED Notes (Signed)
[facility] called and states that they would not transport the patient back as they do not need medical transport and states that [place] would need to transport the patient back.  Reached out to [facility], representative on the phone states that they will have to look into it and disconnected the call.

## 2020-08-15 NOTE — Discharge Instructions (Addendum)
Take Keflex to treat a mild cellulitis of the left thigh for the next 7 days. Your doctor should recheck this area in 2-3 days to insure it is improving.   Take the potassium supplement twice daily for the next week

## 2020-08-29 ENCOUNTER — Ambulatory Visit: Payer: Medicare Other | Admitting: Internal Medicine

## 2020-09-11 ENCOUNTER — Telehealth: Payer: Self-pay

## 2020-09-11 ENCOUNTER — Other Ambulatory Visit: Payer: Self-pay | Admitting: Internal Medicine

## 2020-09-11 NOTE — Telephone Encounter (Signed)
Appears this was discontinued by provider during hospitalization in December. Will not refill at present.   Phill Myron, D.O. Primary Care at [facility]  09/11/2020, 4:23 PM

## 2020-09-11 NOTE — Telephone Encounter (Signed)
RF request for allopurinol 100mg  tab- pt has follow-up appt on 09-19-20, pls send refill to [place] if appropriate

## 2020-09-12 ENCOUNTER — Other Ambulatory Visit: Payer: Self-pay

## 2020-09-12 MED ORDER — QUETIAPINE FUMARATE 50 MG PO TABS: 50.0000 mg | ORAL_TABLET | Freq: Every day | ORAL | 0 refills | Status: DC

## 2020-09-19 ENCOUNTER — Ambulatory Visit: Payer: Medicare Other | Admitting: Internal Medicine

## 2020-09-20 ENCOUNTER — Ambulatory Visit (INDEPENDENT_AMBULATORY_CARE_PROVIDER_SITE_OTHER): Payer: Medicare Other | Admitting: Internal Medicine

## 2020-09-20 ENCOUNTER — Other Ambulatory Visit: Payer: Self-pay

## 2020-09-20 ENCOUNTER — Encounter: Payer: Self-pay | Admitting: Internal Medicine

## 2020-09-20 VITALS — BP 118/82 | HR 101 | Temp 97.8°F | Resp 18 | Ht 72.0 in | Wt 194.2 lb

## 2020-09-20 DIAGNOSIS — K7031 Alcoholic cirrhosis of liver with ascites: Secondary | ICD-10-CM

## 2020-09-20 DIAGNOSIS — K219 Gastro-esophageal reflux disease without esophagitis: Secondary | ICD-10-CM | POA: Diagnosis not present

## 2020-09-20 DIAGNOSIS — Z1211 Encounter for screening for malignant neoplasm of colon: Secondary | ICD-10-CM

## 2020-09-20 MED ORDER — RIFAXIMIN 550 MG PO TABS: 550.0000 mg | ORAL_TABLET | Freq: Two times a day (BID) | ORAL | 1 refills | Status: DC

## 2020-09-20 MED ORDER — FUROSEMIDE 40 MG PO TABS: 40.0000 mg | ORAL_TABLET | Freq: Every day | ORAL | Status: DC

## 2020-09-20 MED ORDER — SPIRONOLACTONE 100 MG PO TABS: 100.0000 mg | ORAL_TABLET | Freq: Every day | ORAL | 1 refills | Status: DC

## 2020-09-20 MED ORDER — PANTOPRAZOLE SODIUM 40 MG PO TBEC: 40.0000 mg | DELAYED_RELEASE_TABLET | Freq: Two times a day (BID) | ORAL | 1 refills | Status: DC

## 2020-09-20 MED ORDER — LACTULOSE 10 GM/15ML PO SOLN: 30.0000 g | Freq: Three times a day (TID) | ORAL | 3 refills | Status: DC

## 2020-09-20 NOTE — Progress Notes (Signed)
Subjective:    [name] - 50 y.o. male MRN [id number]  Date of birth: January 25, 1971  HPI  [name] is here for follow up of chronic medical conditions.  He has a history of alcohol cirrhosis. Was hospitalized in December for this and found to have cirrhosis with encephalopathy. He was prescribed Lactulose, Rifaximin, Lasix, Spironolactone. Then was in a rehab facility for a month. Reports compliance with all medications except the lactulose because he was not prescribed enough supply upon d/c. He does not have a GI doctor he is established with. Feels like he is doing better. Has not been losing his balance as much. No alcohol intake at all.     Health Maintenance:  Health Maintenance Due  Topic Date Due  . COLONOSCOPY (Pts 45-29yrs Insurance coverage will need to be confirmed)  Never done  . COVID-19 Vaccine (3 - Booster for Pfizer series) 06/07/2020    -  reports that he has been smoking cigarettes. He has a 45.00 pack-year smoking history. He has never used smokeless tobacco. - Review of Systems: Per HPI. - Past Medical History: Patient Active Problem List   Diagnosis Date Noted  . Alcoholic hepatitis with ascites   . Acute respiratory failure ([place])   . Pressure injury of skin 07/17/2020  . Encephalopathy, hepatic ([place]) 07/16/2020  . Alcoholic cirrhosis of liver with ascites ([place])   . Hypomagnesemia 05/03/2020  . Hyperbilirubinemia 05/03/2020  . D-dimer, elevated 05/03/2020  . Alcoholism ([place]) 05/03/2020  . Rhabdomyolysis 03/06/2019  . Alcoholic hepatitis without ascites 03/06/2019  . Alcohol withdrawal ([place]) 01/15/2019  . HTN (hypertension) 01/10/2019  . Fall 01/10/2019  . Alcohol intoxication ([place]) 01/10/2019  . Schizoaffective disorder, bipolar type ([place]) 06/02/2018  . Alcohol dependence with uncomplicated withdrawal ([place]) 02/22/2016  . Alcohol-induced mood disorder ([place]) 02/22/2016  . Gout 10/22/2015  . GERD (gastroesophageal reflux disease) 10/22/2015  .  Alcohol withdrawal seizure with complication ([place]) 10/62/6948  . Schizoaffective disorder, depressive type ([place])   . History of alcohol abuse   . Tobacco dependence due to cigarettes 11/20/2014  . Alcohol abuse with intoxication ([place])   . Hyponatremia 10/18/2014  . Abnormal LFTs   . Alcohol abuse 02/09/2014  . Post traumatic stress disorder (PTSD) 11/03/2011  . Hypokalemia 07/02/2011   - Medications: reviewed and updated   Objective:   Physical Exam BP 118/82 (BP Location: Right Arm, Patient Position: Sitting, Cuff Size: Normal)   Pulse (!) 101   Temp 97.8 F (36.6 C) (Oral)   Resp 18   Ht 6' (1.829 m)   Wt 194 lb 3.2 oz (88.1 kg)   SpO2 98%   BMI 26.34 kg/m  Physical Exam Constitutional:      General: He is not in acute distress.    Appearance: He is not diaphoretic.  Cardiovascular:     Rate and Rhythm: Normal rate.  Pulmonary:     Effort: Pulmonary effort is normal. No respiratory distress.  Musculoskeletal:        General: Normal range of motion.  Skin:    General: Skin is warm and dry.  Neurological:     Mental Status: He is alert and oriented to person, place, and time.  Psychiatric:        Mood and Affect: Affect normal.        Judgment: Judgment normal.            Assessment & Plan:   1. Alcoholic cirrhosis of liver with ascites (HCC) Continue current regimen and resume  Lactulose. Monitor CMET with medication use and check ammonia level. Needs to establish with GI.  - Ambulatory referral to Gastroenterology - rifaximin (XIFAXAN) 550 MG TABS tablet; Take 1 tablet (550 mg total) by mouth 2 (two) times daily.  Dispense: 180 tablet; Refill: 1 - furosemide (LASIX) 40 MG tablet; Take 1 tablet (40 mg total) by mouth daily.  Dispense: 30 tablet - spironolactone (ALDACTONE) 100 MG tablet; Take 1 tablet (100 mg total) by mouth daily.  Dispense: 90 tablet; Refill: 1 - lactulose (CHRONULAC) 10 GM/15ML solution; Take 45 mLs (30 g total) by mouth 3 (three) times  daily.  Dispense: 1892 mL; Refill: 3 - Ammonia - Comprehensive metabolic panel  2. Screening for colon cancer - Ambulatory referral to Gastroenterology  3. Gastroesophageal reflux disease, unspecified whether esophagitis present - pantoprazole (PROTONIX) 40 MG tablet; Take 1 tablet (40 mg total) by mouth 2 (two) times daily.  Dispense: 180 tablet; Refill: [place], D.O. 09/20/2020, 2:31 PM Primary Care at [facility]

## 2020-09-21 LAB — COMPREHENSIVE METABOLIC PANEL
ALT: 51 IU/L — ABNORMAL HIGH (ref 0–44)
AST: 68 IU/L — ABNORMAL HIGH (ref 0–40)
Albumin/Globulin Ratio: 1.6 (ref 1.2–2.2)
Albumin: 4.2 g/dL (ref 4.0–5.0)
Alkaline Phosphatase: 174 IU/L — ABNORMAL HIGH (ref 44–121)
BUN/Creatinine Ratio: 16 (ref 9–20)
BUN: 18 mg/dL (ref 6–24)
Bilirubin Total: 1.3 mg/dL — ABNORMAL HIGH (ref 0.0–1.2)
CO2: 25 mmol/L (ref 20–29)
Calcium: 10.1 mg/dL (ref 8.7–10.2)
Chloride: 97 mmol/L (ref 96–106)
Creatinine, Ser: 1.14 mg/dL (ref 0.76–1.27)
GFR calc Af Amer: 88 mL/min/{1.73_m2} (ref >59)
GFR calc non Af Amer: 76 mL/min/{1.73_m2} (ref >59)
Globulin, Total: 2.6 g/dL (ref 1.5–4.5)
Glucose: 110 mg/dL — ABNORMAL HIGH (ref 65–99)
Potassium: 5.1 mmol/L (ref 3.5–5.2)
Sodium: 137 mmol/L (ref 134–144)
Total Protein: 6.8 g/dL (ref 6.0–8.5)

## 2020-09-21 LAB — AMMONIA

## 2020-09-21 LAB — SPECIMEN STATUS REPORT

## 2020-09-27 ENCOUNTER — Other Ambulatory Visit: Payer: Medicare Other

## 2020-09-27 ENCOUNTER — Other Ambulatory Visit: Payer: Self-pay

## 2020-09-27 DIAGNOSIS — K7031 Alcoholic cirrhosis of liver with ascites: Secondary | ICD-10-CM

## 2020-09-27 DIAGNOSIS — F1010 Alcohol abuse, uncomplicated: Secondary | ICD-10-CM

## 2020-09-28 LAB — AMMONIA: Ammonia: 61 ug/dL (ref 40–200)

## 2020-10-11 ENCOUNTER — Other Ambulatory Visit: Payer: Self-pay

## 2020-10-11 MED ORDER — SERTRALINE HCL 25 MG PO TABS: 25.0000 mg | ORAL_TABLET | Freq: Every day | ORAL | 0 refills | Status: DC

## 2020-10-22 ENCOUNTER — Other Ambulatory Visit: Payer: Self-pay

## 2020-10-22 MED ORDER — QUETIAPINE FUMARATE 50 MG PO TABS: 50.0000 mg | ORAL_TABLET | Freq: Every day | ORAL | 0 refills | Status: DC

## 2020-11-19 ENCOUNTER — Ambulatory Visit: Payer: Medicare Other | Admitting: Gastroenterology

## 2020-11-26 ENCOUNTER — Telehealth: Payer: Self-pay | Admitting: Internal Medicine

## 2020-11-26 ENCOUNTER — Telehealth: Payer: Self-pay | Admitting: *Deleted

## 2020-11-26 MED ORDER — QUETIAPINE FUMARATE 50 MG PO TABS: 50.0000 mg | ORAL_TABLET | Freq: Every day | ORAL | 0 refills | Status: DC

## 2020-11-26 NOTE — Telephone Encounter (Signed)
Pt called in stating he has been trying to get his medication refilled. Pt is needing a refill on    QUEtiapine (SEROQUEL) 50 MG tablet    [facility], [place] - [address]  [place], [place] [address]  Phone:  [phone] Fax:  [phone]

## 2020-12-07 NOTE — Telephone Encounter (Signed)
Spoke to pharmacist.  No refill pending.  Last refill was in March.  Would you like for patient to return for Ammonia level, OV or RF rx.

## 2020-12-07 NOTE — Telephone Encounter (Signed)
Pt states he needs a refill of this medication lactulose (CHRONULAC) 10 GM/15ML solution [[id number]]. Pt verbalized that he needs this refilled today. Please follow up with pt regarding this matter.

## 2020-12-10 ENCOUNTER — Other Ambulatory Visit: Payer: Self-pay | Admitting: Internal Medicine

## 2020-12-10 ENCOUNTER — Other Ambulatory Visit: Payer: Self-pay

## 2020-12-10 ENCOUNTER — Telehealth: Payer: Self-pay | Admitting: Internal Medicine

## 2020-12-10 DIAGNOSIS — K7031 Alcoholic cirrhosis of liver with ascites: Secondary | ICD-10-CM

## 2020-12-10 MED ORDER — LACTULOSE 10 GM/15ML PO SOLN: 30.0000 g | Freq: Three times a day (TID) | ORAL | 3 refills | Status: DC

## 2020-12-10 NOTE — Telephone Encounter (Signed)
Looks like this was refilled today, which is fine.   Phill Myron, D.O. Primary Care at [facility]  12/10/2020, 7:22 PM

## 2020-12-12 NOTE — Telephone Encounter (Signed)
Sent to provider 

## 2020-12-20 ENCOUNTER — Other Ambulatory Visit (INDEPENDENT_AMBULATORY_CARE_PROVIDER_SITE_OTHER): Payer: Medicare Other

## 2020-12-20 ENCOUNTER — Encounter: Payer: Self-pay | Admitting: Gastroenterology

## 2020-12-20 ENCOUNTER — Other Ambulatory Visit: Payer: Self-pay

## 2020-12-20 ENCOUNTER — Ambulatory Visit (INDEPENDENT_AMBULATORY_CARE_PROVIDER_SITE_OTHER): Payer: Medicare Other | Admitting: Gastroenterology

## 2020-12-20 ENCOUNTER — Telehealth: Payer: Self-pay | Admitting: *Deleted

## 2020-12-20 VITALS — BP 82/58 | HR 82 | Ht 72.0 in | Wt 197.0 lb

## 2020-12-20 DIAGNOSIS — Z1211 Encounter for screening for malignant neoplasm of colon: Secondary | ICD-10-CM

## 2020-12-20 DIAGNOSIS — K7030 Alcoholic cirrhosis of liver without ascites: Secondary | ICD-10-CM

## 2020-12-20 DIAGNOSIS — K7031 Alcoholic cirrhosis of liver with ascites: Secondary | ICD-10-CM

## 2020-12-20 LAB — COMPREHENSIVE METABOLIC PANEL
ALT: 28 U/L (ref 0–53)
AST: 29 U/L (ref 0–37)
Albumin: 4.7 g/dL (ref 3.5–5.2)
Alkaline Phosphatase: 108 U/L (ref 39–117)
BUN: 15 mg/dL (ref 6–23)
CO2: 24 mEq/L (ref 19–32)
Calcium: 10 mg/dL (ref 8.4–10.5)
Chloride: 103 mEq/L (ref 96–112)
Creatinine, Ser: 0.96 mg/dL (ref 0.40–1.50)
GFR: 93.7 mL/min (ref >60.00)
Glucose, Bld: 77 mg/dL (ref 70–99)
Potassium: 4.4 mEq/L (ref 3.5–5.1)
Sodium: 139 mEq/L (ref 135–145)
Total Bilirubin: 0.4 mg/dL (ref 0.2–1.2)
Total Protein: 7.9 g/dL (ref 6.0–8.3)

## 2020-12-20 LAB — IBC + FERRITIN
Ferritin: 160.8 ng/mL (ref 22.0–322.0)
Iron: 73 ug/dL (ref 42–165)
Saturation Ratios: 18.7 % — ABNORMAL LOW (ref 20.0–50.0)
Transferrin: 279 mg/dL (ref 212.0–360.0)

## 2020-12-20 LAB — CBC WITH DIFFERENTIAL/PLATELET
Basophils Absolute: 0 10*3/uL (ref 0.0–0.1)
Basophils Relative: 0.4 % (ref 0.0–3.0)
Eosinophils Absolute: 0.3 10*3/uL (ref 0.0–0.7)
Eosinophils Relative: 2.5 % (ref 0.0–5.0)
HCT: 39.5 % (ref 39.0–52.0)
Hemoglobin: 13.3 g/dL (ref 13.0–17.0)
Lymphocytes Relative: 25.6 % (ref 12.0–46.0)
Lymphs Abs: 2.7 10*3/uL (ref 0.7–4.0)
MCHC: 33.7 g/dL (ref 30.0–36.0)
MCV: 90.9 fl (ref 78.0–100.0)
Monocytes Absolute: 0.8 10*3/uL (ref 0.1–1.0)
Monocytes Relative: 8 % (ref 3.0–12.0)
Neutro Abs: 6.7 10*3/uL (ref 1.4–7.7)
Neutrophils Relative %: 63.5 % (ref 43.0–77.0)
Platelets: 275 10*3/uL (ref 150.0–400.0)
RBC: 4.34 Mil/uL (ref 4.22–5.81)
RDW: 14.2 % (ref 11.5–15.5)
WBC: 10.6 10*3/uL — ABNORMAL HIGH (ref 4.0–10.5)

## 2020-12-20 LAB — PROTIME-INR
INR: 1 ratio (ref 0.8–1.0)
Prothrombin Time: 11.4 s (ref 9.6–13.1)

## 2020-12-20 LAB — SEDIMENTATION RATE: Sed Rate: 38 mm/hr — ABNORMAL HIGH (ref 0–15)

## 2020-12-20 MED ORDER — SUPREP BOWEL PREP KIT 17.5-3.13-1.6 GM/177ML PO SOLN: 1.0000 | Freq: Once | ORAL | 0 refills | Status: AC

## 2020-12-20 MED ORDER — LACTULOSE 10 GM/15ML PO SOLN: 30.0000 g | Freq: Three times a day (TID) | ORAL | 3 refills | Status: DC

## 2020-12-20 NOTE — Telephone Encounter (Signed)
Referral  to liver clinic for consult for liver transplant  Waiting on office notes to be completed

## 2020-12-20 NOTE — Progress Notes (Signed)
[name]    [id number]    06-10-1971  Primary Care Physician:Wallace, Bayard Beaver, DO  Referring Physician: Nicolette Bang, [address], [place],  [place] [address]   Chief complaint: Alcoholic cirrhosis, nausea, diarrhea  HPI:  50 year old very pleasant male here for follow-up for alcoholic cirrhosis He is abstaining from alcohol, has not had any since November 2021 He is having intermittent abdominal pain and nausea, occasional vomiting.  He is maintaining his weight Denies any melena or rectal bleeding He has multiple bowel movements a day on average 5-6 bowel movements  EGD 05/05/20 The Z-line was regular and was found 38 cm from the incisors. Grade I varices were found in the lower third of the esophagus. They were less than 5 mm in largest diameter. Mild portal hypertensive gastropathy was found in the gastric fundus and in the gastric body. Patchy mildly erythematous mucosa without active bleeding was found in the duodenal bulb. The exam was otherwise without abnormality.   Outpatient Encounter Medications as of 12/20/2020  Medication Sig  . lactulose (CHRONULAC) 10 GM/15ML solution Take 45 mLs (30 g total) by mouth 3 (three) times daily.  . Multiple Vitamin (MULTIVITAMIN WITH MINERALS) TABS tablet Take 1 tablet by mouth daily.  . pantoprazole (PROTONIX) 40 MG tablet Take 1 tablet (40 mg total) by mouth 2 (two) times daily.  . polyethylene glycol (MIRALAX / GLYCOLAX) 17 g packet Take 17 g by mouth daily as needed for moderate constipation.  . QUEtiapine (SEROQUEL) 50 MG tablet Take 1 tablet (50 mg total) by mouth at bedtime.  . sertraline (ZOLOFT) 25 MG tablet Take 1 tablet (25 mg total) by mouth daily.  [name] spironolactone (ALDACTONE) 100 MG tablet Take 1 tablet (100 mg total) by mouth daily.  . vitamin C (VITAMIN C) 250 MG tablet Take 1 tablet (250 mg total) by mouth 2 (two) times daily.  . [DISCONTINUED] B Complex-C (B-COMPLEX WITH  VITAMIN C) tablet Take 1 tablet by mouth daily. (Patient not taking: Reported on 12/20/2020)  . [DISCONTINUED] folic acid (FOLVITE) 1 MG tablet Take 1 tablet (1 mg total) by mouth daily. (Patient not taking: Reported on 12/20/2020)  . [DISCONTINUED] furosemide (LASIX) 40 MG tablet Take 1 tablet (40 mg total) by mouth daily. (Patient not taking: Reported on 12/20/2020)  . [DISCONTINUED] midodrine (PROAMATINE) 10 MG tablet Take 1 tablet (10 mg total) by mouth 3 (three) times daily with meals. (Patient not taking: Reported on 12/20/2020)  . [DISCONTINUED] potassium chloride 20 MEQ/15ML (10%) SOLN Take 15 mLs (20 mEq total) by mouth 2 (two) times daily for 7 days.  . [DISCONTINUED] rifaximin (XIFAXAN) 550 MG TABS tablet Take 1 tablet (550 mg total) by mouth 2 (two) times daily. (Patient not taking: Reported on 12/20/2020)  . [DISCONTINUED] thiamine 100 MG tablet Take 1 tablet (100 mg total) by mouth daily. (Patient not taking: Reported on 12/20/2020)   No facility-administered encounter medications on file as of 12/20/2020.    Allergies as of 12/20/2020 - Review Complete 12/20/2020  Allergen Reaction Noted  . Wellbutrin [bupropion] Other (See Comments) 10/18/2014    Past Medical History:  Diagnosis Date  . Alcohol abuse   . Anxiety    at age 70  . Depression    at age 54  . GERD (gastroesophageal reflux disease)   . Hypertension   . Psychosis ([place])   . PTSD (post-traumatic stress disorder)   . Schizoaffective disorder   . Seizure  disorder ([place])    related to etoh seizure  . Symptomatic anemia     Past Surgical History:  Procedure Laterality Date  . ESOPHAGOGASTRODUODENOSCOPY (EGD) WITH PROPOFOL N/A 05/05/2020   Procedure: ESOPHAGOGASTRODUODENOSCOPY (EGD) WITH PROPOFOL;  Surgeon: [name], MD;  Location: [facility];  Service: Endoscopy;  Laterality: N/A;  . FOOT SURGERY     right  . HAND SURGERY      Family History  Problem Relation Age of Onset  . Alcoholism Mother   . CAD  Father   . Depression Brother   . Colon polyps Neg Hx   . Esophageal cancer Neg Hx   . Pancreatic cancer Neg Hx   . Stomach cancer Neg Hx     Social History   Socioeconomic History  . Marital status: Divorced    Spouse name: Not on file  . Number of children: Not on file  . Years of education: Not on file  . Highest education level: Not on file  Occupational History  . Not on file  Tobacco Use  . Smoking status: Current Every Day Smoker    Packs/day: 1.50    Years: 30.00    Pack years: 45.00    Types: Cigarettes  . Smokeless tobacco: Never Used  Vaping Use  . Vaping Use: Never used  Substance and Sexual Activity  . Alcohol use: Not Currently    Alcohol/week: 0.0 standard drinks  . Drug use: Not Currently    Frequency: 1.0 times per week    Types: Marijuana  . Sexual activity: Yes    Birth control/protection: Condom  Other Topics Concern  . Not on file  Social History Narrative  . Not on file   Social Determinants of Health   Financial Resource Strain: Not on file  Food Insecurity: Not on file  Transportation Needs: Not on file  Physical Activity: Not on file  Stress: Not on file  Social Connections: Not on file  Intimate Partner Violence: Not on file      Review of systems: All other review of systems negative except as mentioned in the HPI.   Physical Exam: Vitals:   12/20/20 0848  BP: (!) 82/58  Pulse: 82   Body mass index is 48.00 kg/m. Gen:      No acute distress HEENT:  sclera anicteric Abd:      soft, non-tender; no palpable masses, no distension Ext:    No edema Neuro: alert and oriented x 3 Psych: normal mood and affect  Data Reviewed:  Reviewed labs, radiology imaging, old records and pertinent past GI work up   Assessment and Plan/Recommendations:  50 year old very pleasant male with alcoholic cirrhosis decompensated by ascites Continue to abstain from alcohol MELD score 6 He had elevated ferritin in September 2021, will  recheck to exclude iron overload or hemochromatosis  Ascites improved: He is off diuretics  Hepatic encephalopathy: Clinically stable Use lactulose as needed  Diarrhea: Could be secondary to lactulose and Miralax.  DC Miralax He is due for colorectal cancer screening, schedule for colonoscopy and will obtain biopsies if he continues to have persistent diarrhea to exclude microscopic colitis  HCC screening: Check AFP No acute lesions on abdominal ultrasound in December 2021  Will refer to liver clinic for evaluation, possible liver transplant given his age.  Discussed in detail the importance of continuing  to abstain from alcohol  The risks and benefits as well as alternatives of endoscopic procedure(s) have been discussed and reviewed. All questions answered. The  patient agrees to proceed.  Return in 6 months or sooner if needed  K. Denzil Magnuson , MD    CC: Caryl Never*

## 2020-12-20 NOTE — Patient Instructions (Addendum)
You have been scheduled for a colonoscopy. Please follow written instructions given to you at your visit today.  Please pick up your prep supplies at the pharmacy within the next 1-3 days. If you use inhalers (even only as needed), please bring them with you on the day of your procedure.  We will refer [facility] to [facility] and they will contact you with that appointment  We have removed Miralax and Aldactone from your medication list  We have sent refills of Lactulose to your pharmacy    Your provider has requested that you go to the basement level for lab work before leaving today. Press "B" on the elevator. The lab is located at the first door on the left as you exit the elevator.  Follow up in 6 months  Due to recent changes in healthcare laws, you may see the results of your imaging and laboratory studies on MyChart before your provider has had a chance to review them.  We understand that in some cases there may be results that are confusing or concerning to you. Not all laboratory results come back in the same time frame and the provider may be waiting for multiple results in order to interpret others.  Please give [place] 48 hours in order for your provider to thoroughly review all the results before contacting the office for clarification of your results.   If you are age 41 or older, your body mass index should be between 23-30. Your Body mass index is 26.72 kg/m. If this is out of the aforementioned range listed, please consider follow up with your Primary Care Provider.  If you are age 38 or younger, your body mass index should be between 19-25. Your Body mass index is 26.72 kg/m. If this is out of the aformentioned range listed, please consider follow up with your Primary Care Provider.    I appreciate the  opportunity to care for you  Thank You   Harl Bowie , MD

## 2020-12-21 LAB — AFP TUMOR MARKER: AFP-Tumor Marker: 4.5 ng/mL (ref <6.1)

## 2020-12-24 ENCOUNTER — Encounter: Payer: Self-pay | Admitting: Internal Medicine

## 2020-12-24 ENCOUNTER — Other Ambulatory Visit: Payer: Self-pay

## 2020-12-24 ENCOUNTER — Telehealth (INDEPENDENT_AMBULATORY_CARE_PROVIDER_SITE_OTHER): Payer: Medicare Other | Admitting: Internal Medicine

## 2020-12-24 DIAGNOSIS — M792 Neuralgia and neuritis, unspecified: Secondary | ICD-10-CM | POA: Diagnosis not present

## 2020-12-24 MED ORDER — GABAPENTIN 300 MG PO CAPS: 300.0000 mg | ORAL_CAPSULE | Freq: Three times a day (TID) | ORAL | 3 refills | Status: DC

## 2020-12-24 MED ORDER — LACTULOSE 10 GM/15ML PO SOLN
30.0000 g | Freq: Three times a day (TID) | ORAL | 3 refills | Status: DC
Start: 2020-12-24 — End: 2021-05-15

## 2020-12-24 NOTE — Progress Notes (Signed)
BLE pain  Numbness and tingling "pins and needles"  8/10  No OTC  Falls in the last year

## 2020-12-24 NOTE — Progress Notes (Signed)
Virtual Visit via Telephone Note  I connected with [name], on 12/24/2020 at 3:39 PM by telephone due to the COVID-19 pandemic and verified that I am speaking with the correct person using two identifiers.   Consent: I discussed the limitations, risks, security and privacy concerns of performing an evaluation and management service by telephone and the availability of in person appointments. I also discussed with the patient that there may be a patient responsible charge related to this service. The patient expressed understanding and agreed to proceed.   Location of Patient: Home   Location of Provider: Clinic    Persons participating in Telemedicine visit: Mouhamad Teed [facility] / [facility] Dr. Juleen China   History of Present Illness: Patient has a visit for follow up. Patient has concerns about lower extremity pain. Reports pins and needles type pain, worst when he lays down. Worst in feet, but occurs from the knees down. Has been occurring since he woke up in the hospital in the Nov. Has been progressively getting worse. No associated low back pain. No changes in bowel or bladder.  Has history of alcohol abuse, is currently not drinking. Has not tried anything OTC.    Past Medical History:  Diagnosis Date  . Alcohol abuse   . Anxiety    at age 89  . Depression    at age 43  . GERD (gastroesophageal reflux disease)   . Hypertension   . Psychosis ([place])   . PTSD (post-traumatic stress disorder)   . Schizoaffective disorder   . Seizure disorder ([place])    related to etoh seizure  . Symptomatic anemia    Allergies  Allergen Reactions  . Wellbutrin [Bupropion] Other (See Comments)    Caused seizures    Current Outpatient Medications on File Prior to Visit  Medication Sig Dispense Refill  . lactulose (CHRONULAC) 10 GM/15ML solution Take 45 mLs (30 g total) by mouth 3 (three) times daily. 1892 mL 3  . Multiple Vitamin (MULTIVITAMIN WITH MINERALS) TABS tablet Take 1 tablet  by mouth daily.    . pantoprazole (PROTONIX) 40 MG tablet Take 1 tablet (40 mg total) by mouth 2 (two) times daily. 180 tablet 1  . QUEtiapine (SEROQUEL) 50 MG tablet Take 1 tablet (50 mg total) by mouth at bedtime. 30 tablet 0  . sertraline (ZOLOFT) 25 MG tablet Take 1 tablet (25 mg total) by mouth daily. 90 tablet 0  . vitamin C (VITAMIN C) 250 MG tablet Take 1 tablet (250 mg total) by mouth 2 (two) times daily.     No current facility-administered medications on file prior to visit.    Observations/Objective: NAD. Speaking clearly.  Work of breathing normal.  Alert and oriented. Mood appropriate.   Assessment and Plan: 1. Neuropathic pain Suspect related to chronic alcohol use. However, will obtain labs to rule out other potential causes. Given distribution and lack of back pain, much less suspicious for lumbar etiology. Will start Gabapentin for pain.  - gabapentin (NEURONTIN) 300 MG capsule; Take 1 capsule (300 mg total) by mouth 3 (three) times daily.  Dispense: 90 capsule; Refill: 3 - TSH; Future - Vitamin B12; Future - CBC; Future - Folate; Future - Basic Metabolic Panel; Future - RPR; Future - Hemoglobin A1c; Future   Follow Up Instructions: Lab visit 5/11    I discussed the assessment and treatment plan with the patient. The patient was provided an opportunity to ask questions and all were answered. The patient agreed with the plan and demonstrated an understanding  of the instructions.   The patient was advised to call back or seek an in-person evaluation if the symptoms worsen or if the condition fails to improve as anticipated.     I provided 9 minutes total of non-face-to-face time during this encounter including median intraservice time, reviewing previous notes, investigations, ordering medications, medical decision making, coordinating care and patient verbalized understanding at the end of the visit.    Phill Myron, D.O. Primary [facility] at [facility]   12/24/2020, 3:39 PM

## 2020-12-26 ENCOUNTER — Other Ambulatory Visit: Payer: Medicare Other

## 2020-12-27 ENCOUNTER — Other Ambulatory Visit: Payer: Self-pay

## 2020-12-27 ENCOUNTER — Other Ambulatory Visit: Payer: Medicare Other

## 2020-12-27 DIAGNOSIS — M792 Neuralgia and neuritis, unspecified: Secondary | ICD-10-CM

## 2020-12-27 NOTE — Progress Notes (Signed)
LABS COLLECTED.

## 2020-12-31 ENCOUNTER — Encounter: Payer: Self-pay | Admitting: Gastroenterology

## 2020-12-31 LAB — HEMOGLOBIN A1C
Est. average glucose Bld gHb Est-mCnc: 103 mg/dL
Hgb A1c MFr Bld: 5.2 % (ref 4.8–5.6)

## 2020-12-31 LAB — SPECIMEN STATUS REPORT

## 2021-01-03 NOTE — Telephone Encounter (Signed)
Referral to [facility] faxed today with all records

## 2021-01-04 ENCOUNTER — Other Ambulatory Visit: Payer: Self-pay | Admitting: *Deleted

## 2021-01-04 LAB — BASIC METABOLIC PANEL
BUN/Creatinine Ratio: 13 (ref 9–20)
BUN: 13 mg/dL (ref 6–24)
CO2: 18 mmol/L — ABNORMAL LOW (ref 20–29)
Calcium: 9.9 mg/dL (ref 8.7–10.2)
Chloride: 97 mmol/L (ref 96–106)
Creatinine, Ser: 1 mg/dL (ref 0.76–1.27)
Glucose: 87 mg/dL (ref 65–99)
Potassium: 4.6 mmol/L (ref 3.5–5.2)
Sodium: 137 mmol/L (ref 134–144)
eGFR: 93 mL/min/{1.73_m2} (ref >59)

## 2021-01-04 LAB — TSH: TSH: 1.65 u[IU]/mL (ref 0.450–4.500)

## 2021-01-04 LAB — CBC
Hematocrit: 42.9 % (ref 37.5–51.0)
Hemoglobin: 13.8 g/dL (ref 13.0–17.7)
MCH: 30.6 pg (ref 26.6–33.0)
MCHC: 32.2 g/dL (ref 31.5–35.7)
MCV: 95 fL (ref 79–97)
Platelets: 260 10*3/uL (ref 150–450)
RBC: 4.51 x10E6/uL (ref 4.14–5.80)
RDW: 12.7 % (ref 11.6–15.4)
WBC: 12.1 10*3/uL — ABNORMAL HIGH (ref 3.4–10.8)

## 2021-01-04 LAB — FOLATE: Folate: 3.3 ng/mL (ref >3.0)

## 2021-01-04 LAB — VITAMIN B12: Vitamin B-12: 690 pg/mL (ref 232–1245)

## 2021-01-04 LAB — RPR: RPR Ser Ql: NONREACTIVE

## 2021-01-04 MED ORDER — QUETIAPINE FUMARATE 50 MG PO TABS: 50.0000 mg | ORAL_TABLET | Freq: Every day | ORAL | 1 refills | Status: DC

## 2021-01-31 NOTE — Telephone Encounter (Signed)
Referral coordinator to call me back

## 2021-02-11 NOTE — Telephone Encounter (Signed)
Patient has an appointment on 03/08/2021 at 10 am.  Received fax confirmation today.

## 2021-02-13 ENCOUNTER — Other Ambulatory Visit: Payer: Self-pay

## 2021-02-13 ENCOUNTER — Telehealth: Payer: Self-pay | Admitting: Internal Medicine

## 2021-02-13 DIAGNOSIS — F251 Schizoaffective disorder, depressive type: Secondary | ICD-10-CM

## 2021-02-13 MED ORDER — SERTRALINE HCL 25 MG PO TABS: 25.0000 mg | ORAL_TABLET | Freq: Every day | ORAL | 0 refills | Status: DC

## 2021-02-13 NOTE — Telephone Encounter (Signed)
Zoloft refilled

## 2021-02-13 NOTE — Telephone Encounter (Signed)
Pt called in stating he needs a refill on his sertraline (ZOLOFT) 25 MG tablet   [facility], [address]  [place], [place] [address]  Phone:  [phone]  Fax:  [phone]

## 2021-03-06 ENCOUNTER — Encounter: Payer: Self-pay | Admitting: Gastroenterology

## 2021-03-06 ENCOUNTER — Ambulatory Visit (AMBULATORY_SURGERY_CENTER): Payer: Medicare Other | Admitting: Gastroenterology

## 2021-03-06 ENCOUNTER — Telehealth: Payer: Self-pay | Admitting: Gastroenterology

## 2021-03-06 ENCOUNTER — Other Ambulatory Visit: Payer: Self-pay

## 2021-03-06 VITALS — BP 114/69 | HR 57 | Temp 98.9°F | Resp 15 | Ht 72.0 in | Wt 197.0 lb

## 2021-03-06 DIAGNOSIS — Z1211 Encounter for screening for malignant neoplasm of colon: Secondary | ICD-10-CM | POA: Diagnosis not present

## 2021-03-06 DIAGNOSIS — D123 Benign neoplasm of transverse colon: Secondary | ICD-10-CM | POA: Diagnosis not present

## 2021-03-06 MED ORDER — SODIUM CHLORIDE 0.9 % IV SOLN: 500.0000 mL | Freq: Once | INTRAVENOUS | Status: DC

## 2021-03-06 NOTE — Progress Notes (Signed)
Report given to PACU, vss 

## 2021-03-06 NOTE — Patient Instructions (Signed)
Handouts given:  polyps, hemorrhoids Resume previous diet Continue current medications Await pathology results  YOU HAD AN ENDOSCOPIC PROCEDURE TODAY AT [facility]:   Refer to the procedure report that was given to you for any specific questions about what was found during the examination.  If the procedure report does not answer your questions, please call your gastroenterologist to clarify.  If you requested that your care partner not be given the details of your procedure findings, then the procedure report has been included in a sealed envelope for you to review at your convenience later.  YOU SHOULD EXPECT: Some feelings of bloating in the abdomen. Passage of more gas than usual.  Walking can help get rid of the air that was put into your GI tract during the procedure and reduce the bloating. If you had a lower endoscopy (such as a colonoscopy or flexible sigmoidoscopy) you may notice spotting of blood in your stool or on the toilet paper. If you underwent a bowel prep for your procedure, you may not have a normal bowel movement for a few days.  Please Note:  You might notice some irritation and congestion in your nose or some drainage.  This is from the oxygen used during your procedure.  There is no need for concern and it should clear up in a day or so.  SYMPTOMS TO REPORT IMMEDIATELY:  Following lower endoscopy (colonoscopy or flexible sigmoidoscopy):  Excessive amounts of blood in the stool  Significant tenderness or worsening of abdominal pains  Swelling of the abdomen that is new, acute  Fever of 100F or higher  For urgent or emergent issues, a gastroenterologist can be reached at any hour by calling [phone]. Do not use MyChart messaging for urgent concerns.   DIET:  We do recommend a small meal at first, but then you may proceed to your regular diet.  Drink plenty of fluids but you should avoid alcoholic beverages for 24 hours.  ACTIVITY:  You should  plan to take it easy for the rest of today and you should NOT DRIVE or use heavy machinery until tomorrow (because of the sedation medicines used during the test).    FOLLOW UP: Our staff will call the number listed on your records 48-72 hours following your procedure to check on you and address any questions or concerns that you may have regarding the information given to you following your procedure. If we do not reach you, we will leave a message.  We will attempt to reach you two times.  During this call, we will ask if you have developed any symptoms of COVID 19. If you develop any symptoms (ie: fever, flu-like symptoms, shortness of breath, cough etc.) before then, please call [phone].  If you test positive for Covid 19 in the 2 weeks post procedure, please call and report this information to [place].    If any biopsies were taken you will be contacted by phone or by letter within the next 1-3 weeks.  Please call us at [phone] if you have not heard about the biopsies in 3 weeks.   SIGNATURES/CONFIDENTIALITY: You and/or your care partner have signed paperwork which will be entered into your electronic medical record.  These signatures attest to the fact that that the information above on your After Visit Summary has been reviewed and is understood.  Full responsibility of the confidentiality of this discharge information lies with you and/or your care-partner.

## 2021-03-06 NOTE — Telephone Encounter (Signed)
Pt was given suprep for bowel prep. Pt stated that he tolerated first half of bowel prep, and vomited the second half this morning. Stated that he had 2 oz left of suprep before vomiting. Last bowel movement was liquid and yellow in color. Pt also stated that he saw some stool pieces. Spoke with MD, recommended pt do 6 cap fulls of miralax until NPO time. Informed pt of MD recommendations. Pt verbalized understanding, and had no further concerns.

## 2021-03-06 NOTE — Progress Notes (Signed)
Called to room to assist during endoscopic procedure.  Patient ID and intended procedure confirmed with present staff. Received instructions for my participation in the procedure from the performing physician.  

## 2021-03-06 NOTE — Op Note (Signed)
[facility] Patient Name: [name] Procedure Date: 03/06/2021 3:33 PM MRN: [id number] Endoscopist: [name] , MD Age: 50 Referring MD:  Date of Birth: 1971/06/08 Gender: Male Account #: [id number] Procedure:                Colonoscopy Indications:              Screening for colorectal malignant neoplasm Medicines:                Monitored Anesthesia Care Procedure:                Pre-Anesthesia Assessment:                           - Prior to the procedure, a History and Physical                            was performed, and patient medications and                            allergies were reviewed. The patient's tolerance of                            previous anesthesia was also reviewed. The risks                            and benefits of the procedure and the sedation                            options and risks were discussed with the patient.                            All questions were answered, and informed consent                            was obtained. Prior Anticoagulants: The patient has                            taken no previous anticoagulant or antiplatelet                            agents. ASA Grade Assessment: II - A patient with                            mild systemic disease. After reviewing the risks                            and benefits, the patient was deemed in                            satisfactory condition to undergo the procedure.                           After obtaining informed consent, the colonoscope  was passed under direct vision. Throughout the                            procedure, the patient's blood pressure, pulse, and                            oxygen saturations were monitored continuously. The                            PCF-HQ190L Colonoscope was introduced through the                            anus and advanced to the the cecum, identified by                            appendiceal  orifice and ileocecal valve. The                            colonoscopy was performed without difficulty. The                            patient tolerated the procedure well. The quality                            of the bowel preparation was adequate. The                            ileocecal valve, appendiceal orifice, and rectum                            were photographed. Scope In: 3:35:01 PM Scope Out: 3:54:48 PM Scope Withdrawal Time: 0 hours 11 minutes 39 seconds  Total Procedure Duration: 0 hours 19 minutes 47 seconds  Findings:                 The perianal and digital rectal examinations were                            normal.                           Two sessile polyps were found in the transverse                            colon. The polyps were 2 to 3 mm in size. These                            polyps were removed with a cold biopsy forceps.                            Resection and retrieval were complete.                           Non-bleeding external and internal hemorrhoids were  found during retroflexion. The hemorrhoids were                            medium-sized. Complications:            No immediate complications. Estimated Blood Loss:     Estimated blood loss was minimal. Impression:               - Two 2 to 3 mm polyps in the transverse colon,                            removed with a cold biopsy forceps. Resected and                            retrieved.                           - Non-bleeding external and internal hemorrhoids. Recommendation:           - Resume previous diet.                           - Continue present medications.                           - Await pathology results.                           - Repeat colonoscopy in 5-10 years for surveillance                            based on pathology results. [name], MD 03/06/2021 4:00:15 PM This report has been signed electronically.

## 2021-03-06 NOTE — Progress Notes (Signed)
VS by CW. ?

## 2021-03-06 NOTE — Telephone Encounter (Signed)
Inbound call from patient stating he vomited his prep medication today and wants to know what to do.  Please advise.

## 2021-03-07 ENCOUNTER — Other Ambulatory Visit: Payer: Self-pay

## 2021-03-07 DIAGNOSIS — F251 Schizoaffective disorder, depressive type: Secondary | ICD-10-CM

## 2021-03-07 MED ORDER — QUETIAPINE FUMARATE 50 MG PO TABS
50.0000 mg | ORAL_TABLET | Freq: Every day | ORAL | 0 refills | Status: DC
Start: 2021-03-07 — End: 2021-08-26

## 2021-03-07 NOTE — Progress Notes (Signed)
Per patient request Quetiapine 50 mg refilled for courtesy 90 day. Please schedule appointment for additional refills

## 2021-03-08 ENCOUNTER — Telehealth: Payer: Self-pay

## 2021-03-08 DIAGNOSIS — F1910 Other psychoactive substance abuse, uncomplicated: Secondary | ICD-10-CM | POA: Insufficient documentation

## 2021-03-08 DIAGNOSIS — I8510 Secondary esophageal varices without bleeding: Secondary | ICD-10-CM | POA: Insufficient documentation

## 2021-03-08 DIAGNOSIS — K7031 Alcoholic cirrhosis of liver with ascites: Secondary | ICD-10-CM | POA: Insufficient documentation

## 2021-03-08 NOTE — Telephone Encounter (Signed)
Second follow up call attempt, no answer [facility]

## 2021-03-08 NOTE — Telephone Encounter (Signed)
NO ANSWER, MESSAGE LEFT FOR PATIENT. 

## 2021-03-11 ENCOUNTER — Other Ambulatory Visit: Payer: Self-pay | Admitting: Nurse Practitioner

## 2021-03-11 DIAGNOSIS — K7031 Alcoholic cirrhosis of liver with ascites: Secondary | ICD-10-CM

## 2021-03-11 DIAGNOSIS — K7290 Hepatic failure, unspecified without coma: Secondary | ICD-10-CM

## 2021-03-11 DIAGNOSIS — K7682 Hepatic encephalopathy: Secondary | ICD-10-CM

## 2021-03-11 DIAGNOSIS — I8510 Secondary esophageal varices without bleeding: Secondary | ICD-10-CM

## 2021-03-25 ENCOUNTER — Ambulatory Visit
Admission: RE | Admit: 2021-03-25 | Discharge: 2021-03-25 | Disposition: A | Discharge status: Home / Self Care | Payer: Medicare Other | Source: Ambulatory Visit | Attending: Nurse Practitioner | Admitting: Nurse Practitioner

## 2021-03-25 ENCOUNTER — Other Ambulatory Visit: Payer: Self-pay

## 2021-03-25 ENCOUNTER — Encounter: Payer: Self-pay | Admitting: Gastroenterology

## 2021-03-25 DIAGNOSIS — K7290 Hepatic failure, unspecified without coma: Secondary | ICD-10-CM

## 2021-03-25 DIAGNOSIS — K7682 Hepatic encephalopathy: Secondary | ICD-10-CM

## 2021-03-25 DIAGNOSIS — I8510 Secondary esophageal varices without bleeding: Secondary | ICD-10-CM

## 2021-03-25 DIAGNOSIS — K7031 Alcoholic cirrhosis of liver with ascites: Secondary | ICD-10-CM

## 2021-05-08 ENCOUNTER — Telehealth: Payer: Self-pay

## 2021-05-08 NOTE — Telephone Encounter (Signed)
Called to schedule AWV - left to call PCP office

## 2021-05-15 ENCOUNTER — Encounter: Payer: Self-pay | Admitting: Family Medicine

## 2021-05-15 ENCOUNTER — Other Ambulatory Visit: Payer: Self-pay

## 2021-05-15 ENCOUNTER — Ambulatory Visit (INDEPENDENT_AMBULATORY_CARE_PROVIDER_SITE_OTHER): Payer: Medicare Other | Admitting: Family Medicine

## 2021-05-15 VITALS — BP 131/76 | HR 74 | Temp 97.5°F | Resp 16 | Ht 72.0 in | Wt 198.2 lb

## 2021-05-15 DIAGNOSIS — K219 Gastro-esophageal reflux disease without esophagitis: Secondary | ICD-10-CM

## 2021-05-15 DIAGNOSIS — I10 Essential (primary) hypertension: Secondary | ICD-10-CM | POA: Diagnosis not present

## 2021-05-15 DIAGNOSIS — F251 Schizoaffective disorder, depressive type: Secondary | ICD-10-CM | POA: Diagnosis not present

## 2021-05-15 DIAGNOSIS — M65331 Trigger finger, right middle finger: Secondary | ICD-10-CM

## 2021-05-15 DIAGNOSIS — Z1211 Encounter for screening for malignant neoplasm of colon: Secondary | ICD-10-CM

## 2021-05-15 DIAGNOSIS — M792 Neuralgia and neuritis, unspecified: Secondary | ICD-10-CM

## 2021-05-15 MED ORDER — QUETIAPINE FUMARATE 200 MG PO TABS: 200.0000 mg | ORAL_TABLET | Freq: Every day | ORAL | 1 refills | Status: DC

## 2021-05-15 MED ORDER — LACTULOSE 10 GM/15ML PO SOLN: 30.0000 g | Freq: Three times a day (TID) | ORAL | 3 refills | Status: DC

## 2021-05-15 MED ORDER — PANTOPRAZOLE SODIUM 40 MG PO TBEC: 40.0000 mg | DELAYED_RELEASE_TABLET | Freq: Two times a day (BID) | ORAL | 1 refills | Status: DC

## 2021-05-15 MED ORDER — SERTRALINE HCL 25 MG PO TABS: 25.0000 mg | ORAL_TABLET | Freq: Every day | ORAL | 0 refills | Status: DC

## 2021-05-15 MED ORDER — SPIRONOLACTONE 100 MG PO TABS: 100.0000 mg | ORAL_TABLET | Freq: Every day | ORAL | 1 refills | Status: DC

## 2021-05-15 MED ORDER — GABAPENTIN 300 MG PO CAPS: 300.0000 mg | ORAL_CAPSULE | Freq: Three times a day (TID) | ORAL | 3 refills | Status: DC

## 2021-05-15 NOTE — Progress Notes (Signed)
Patient is new to provider.  Patient said that he is struggling with his schizophrenia.

## 2021-05-15 NOTE — Progress Notes (Signed)
Established Patient Office Visit  Subjective:  Patient ID: [name], male    DOB: August 27, 1970  Age: 50 y.o. MRN: [id number]  CC:  Chief Complaint  Patient presents with   Establish Care   Medication Refill   Follow-up    HPI [name] presents for follow-up of chronic med issues with med refills.  Patient reports that he has been without meds.  He also complains of the middle finger on his right hand getting stuck.  This has been happening for some time and now has become more painful.  Lastly he reports that he is struggling with the schizophrenia.  He reports in the past he has been on Seroquel up to 800 mg daily but is presently on 50 mg daily.  He believes that his Zoloft is at an acceptable level.  Past Medical History:  Diagnosis Date   Alcohol abuse    Anxiety    at age 86   Blood transfusion without reported diagnosis    Depression    at age 47   GERD (gastroesophageal reflux disease)    Hypertension    Psychosis ([place])    PTSD (post-traumatic stress disorder)    Schizoaffective disorder    Seizure disorder ([facility])    related to etoh seizure   Symptomatic anemia     Social History   Socioeconomic History   Marital status: Divorced    Spouse name: Not on file   Number of children: Not on file   Years of education: Not on file   Highest education level: Not on file  Occupational History   Not on file  Tobacco Use   Smoking status: Every Day    Packs/day: 1.50    Years: 30.00    Pack years: 45.00    Types: Cigarettes   Smokeless tobacco: Never  Vaping Use   Vaping Use: Never used  Substance and Sexual Activity   Alcohol use: Not Currently    Alcohol/week: 0.0 standard drinks   Drug use: Yes    Frequency: 1.0 times per week    Types: Marijuana   Sexual activity: Yes    Birth control/protection: Condom  Other Topics Concern   Not on file  Social History Narrative   Not on file   Social Determinants of Health   Financial Resource Strain:  Not on file  Food Insecurity: Not on file  Transportation Needs: Not on file  Physical Activity: Not on file  Stress: Not on file  Social Connections: Not on file  Intimate Partner Violence: Not on file    ROS Review of Systems  Psychiatric/Behavioral:  Negative for self-injury and suicidal ideas. The patient is nervous/anxious.   All other systems reviewed and are negative.  Objective:   Today's Vitals: BP 131/76 (BP Location: Right Arm, Patient Position: Sitting, Cuff Size: Large)   Pulse 74   Temp (!) 97.5 F (36.4 C) (Temporal)   Resp 16   Ht 6' (1.829 m)   Wt 198 lb 3.2 oz (89.9 kg)   SpO2 97%   BMI 26.88 kg/m   Physical Exam Vitals and nursing note reviewed.  Constitutional:      General: He is not in acute distress. Cardiovascular:     Rate and Rhythm: Normal rate and regular rhythm.  Pulmonary:     Effort: Pulmonary effort is normal.     Breath sounds: Normal breath sounds.  Abdominal:     Palpations: Abdomen is soft.     Tenderness: There is  no abdominal tenderness.  Musculoskeletal:     Right hand: Tenderness present. Decreased range of motion.  Neurological:     General: No focal deficit present.     Mental Status: He is alert and oriented to person, place, and time.  Psychiatric:        Mood and Affect: Mood is anxious.        Speech: Speech normal.        Behavior: Behavior is cooperative.    Assessment & Plan:   1. Trigger middle finger of right hand Referral to Ortho for further evaluation and management. - Ambulatory referral to Orthopedic Surgery  2. Schizoaffective disorder, depressive type (HCC) Increase Seroquel from 50 mg daily to 200 mg daily and refilled Zoloft at 25 mg p.o. daily patient to be referred urgently to psych for med management. - sertraline (ZOLOFT) 25 MG tablet; Take 1 tablet (25 mg total) by mouth daily.  Dispense: 90 tablet; Refill: 0 - Ambulatory referral to Psychiatry  3. Primary hypertension Appears stable.   Continue present management.  - spironolactone (ALDACTONE) 100 MG tablet; Take 1 tablet (100 mg total) by mouth daily.  Dispense: 90 tablet; Refill: 1  4. Gastroesophageal reflux disease, unspecified whether esophagitis present Regarding meds refilled.  Continue present management. - pantoprazole (PROTONIX) 40 MG tablet; Take 1 tablet (40 mg total) by mouth 2 (two) times daily.  Dispense: 180 tablet; Refill: 1  5. Neuropathic pain Meds refilled. - gabapentin (NEURONTIN) 300 MG capsule; Take 1 capsule (300 mg total) by mouth 3 (three) times daily.  Dispense: 90 capsule; Refill: 3       Outpatient Encounter Medications as of 05/15/2021  Medication Sig   Multiple Vitamin (MULTIVITAMIN WITH MINERALS) TABS tablet Take 1 tablet by mouth daily.   QUEtiapine (SEROQUEL) 200 MG tablet Take 1 tablet (200 mg total) by mouth at bedtime.   QUEtiapine (SEROQUEL) 50 MG tablet Take 1 tablet (50 mg total) by mouth at bedtime.   vitamin C (VITAMIN C) 250 MG tablet Take 1 tablet (250 mg total) by mouth 2 (two) times daily.   [DISCONTINUED] gabapentin (NEURONTIN) 300 MG capsule Take 1 capsule (300 mg total) by mouth 3 (three) times daily.   [DISCONTINUED] lactulose (CHRONULAC) 10 GM/15ML solution Take 45 mLs (30 g total) by mouth 3 (three) times daily.   [DISCONTINUED] pantoprazole (PROTONIX) 40 MG tablet Take 1 tablet (40 mg total) by mouth 2 (two) times daily.   [DISCONTINUED] sertraline (ZOLOFT) 25 MG tablet Take 1 tablet (25 mg total) by mouth daily.   [DISCONTINUED] spironolactone (ALDACTONE) 100 MG tablet Take 100 mg by mouth daily.   gabapentin (NEURONTIN) 300 MG capsule Take 1 capsule (300 mg total) by mouth 3 (three) times daily.   lactulose (CHRONULAC) 10 GM/15ML solution Take 45 mLs (30 g total) by mouth 3 (three) times daily.   pantoprazole (PROTONIX) 40 MG tablet Take 1 tablet (40 mg total) by mouth 2 (two) times daily.   sertraline (ZOLOFT) 25 MG tablet Take 1 tablet (25 mg total) by mouth  daily.   spironolactone (ALDACTONE) 100 MG tablet Take 1 tablet (100 mg total) by mouth daily.   No facility-administered encounter medications on file as of 05/15/2021.    Follow-up: No follow-ups on file.   Becky Sax, MD

## 2021-05-20 ENCOUNTER — Ambulatory Visit (INDEPENDENT_AMBULATORY_CARE_PROVIDER_SITE_OTHER): Payer: Medicare Other | Admitting: Orthopedic Surgery

## 2021-05-20 ENCOUNTER — Ambulatory Visit: Payer: Self-pay

## 2021-05-20 ENCOUNTER — Encounter: Payer: Self-pay | Admitting: Orthopedic Surgery

## 2021-05-20 ENCOUNTER — Other Ambulatory Visit: Payer: Self-pay

## 2021-05-20 VITALS — BP 120/79 | HR 84

## 2021-05-20 DIAGNOSIS — M65331 Trigger finger, right middle finger: Secondary | ICD-10-CM

## 2021-05-20 NOTE — Progress Notes (Signed)
Office Visit Note   Patient: [name]           Date of Birth: 1971/03/28           MRN: [id number] Visit Date: 05/20/2021              Requested by: [name], [place],  [place] [address] PCP: [name], MD   Assessment & Plan: Visit Diagnoses:  1. Trigger finger, right middle finger     Plan: We discussed the diagnosis, prognosis, and both conservative and operative treatment options for trigger finger.  After our discussion, the patient has elected to proceed with A1 pulley release.  Given the severity of his symptoms with loss of AROM, I don't think a CSI will be of much benefit at this point.  We reviewed the benefits of surgery and the potential risks including, but not limited to, persistent symptoms, infection, damage to nearby nerves and blood vessels, delayed wound healing.    All patient concerns and questions were addressed.  A surgical date will be confirmed with the patient.    Follow-Up Instructions: No follow-ups on file.   Orders:  Orders Placed This Encounter  Procedures   XR Finger Middle Right   No orders of the defined types were placed in this encounter.     Procedures: No procedures performed   Clinical Data: No additional findings.   Subjective: Chief Complaint  Patient presents with   Right Middle Finger - Pain    Trigger finger right middle finger, was triggering but now it so swollen he can't bend it to make it lock, onset 08/2020, RIGHT Hand Dom, pain 3/10, +numbness somethimes, - Weakness    This is a 50 yo RHD M who presents w/ pain and limited ROM of his right middle finger.  His symptoms started in January with triggering of his middle finger.  It progressively worsened to the point where it required him to use his left hand to manually extend this middle finger.  For the last month or so, he's developed limited AROM of hs finger and is unable to make a complete fist.  He has swelling and  pain at the level of the MCPJ/A1 pulley.  He denies triggering or limited ROM of any other digits.  He denies any injury to this finger. He denies pain in the middle finger distal to the proximal phalanx.    Review of Systems  Constitutional: Negative.   Respiratory: Negative.    Cardiovascular: Negative.   Skin: Negative.     Objective: Vital Signs: BP 120/79 (BP Location: Left Arm, Patient Position: Sitting)   Pulse 84   Physical Exam Constitutional:      Appearance: Normal appearance.  Cardiovascular:     Rate and Rhythm: Normal rate.     Pulses: Normal pulses.  Pulmonary:     Effort: Pulmonary effort is normal.  Skin:    General: Skin is warm and dry.     Capillary Refill: Capillary refill takes less than 2 seconds.  Neurological:     Mental Status: He is alert.    Right Hand Exam   Tenderness  Right hand tenderness location: TTP over middle finger MCPJ/A1 pulley w/ moderate swelling.  Range of Motion  The patient has normal right wrist ROM.   Muscle Strength  Wrist extension: 5/5  Wrist flexion: 5/5   Other  Erythema: absent Sensation: normal Pulse: present  Comments:  TTP w/ swelling over A1  pulley.  Limited AROM at MPJ.  Palpable triggering with his limited ROM.      Specialty Comments:  No specialty comments available.  Imaging: 3V of the right middle finger taken today are reviewed and interpreted by me.  They demonstrate well maintained joint spaces without evidence of acute injury or degenerative changes.    PMFS History: Patient Active Problem List   Diagnosis Date Noted   Alcoholic hepatitis with ascites    Acute respiratory failure ([place])    Pressure injury of skin 07/17/2020   Encephalopathy, hepatic 60/73/7106   Alcoholic cirrhosis of liver with ascites ([place])    Hypomagnesemia 05/03/2020   Hyperbilirubinemia 05/03/2020   D-dimer, elevated 05/03/2020   Alcoholism ([place]) 05/03/2020   Rhabdomyolysis 26/94/8546   Alcoholic hepatitis  without ascites 03/06/2019   Alcohol withdrawal ([place]) 01/15/2019   HTN (hypertension) 01/10/2019   Fall 01/10/2019   Alcohol intoxication ([place]) 01/10/2019   Schizoaffective disorder, bipolar type ([place]) 06/02/2018   Alcohol dependence with uncomplicated withdrawal ([place]) 02/22/2016   Alcohol-induced mood disorder ([place]) 02/22/2016   Gout 10/22/2015   GERD (gastroesophageal reflux disease) 10/22/2015   Alcohol withdrawal seizure with complication ([place]) 27/10/5007   Schizoaffective disorder, depressive type ([place])    History of alcohol abuse    Tobacco dependence due to cigarettes 11/20/2014   Alcohol abuse with intoxication ([place])    Hyponatremia 10/18/2014   Abnormal LFTs    Alcohol abuse 02/09/2014   Post traumatic stress disorder (PTSD) 11/03/2011   Hypokalemia 07/02/2011   Past Medical History:  Diagnosis Date   Alcohol abuse    Anxiety    at age 50   Blood transfusion without reported diagnosis    Depression    at age 50   GERD (gastroesophageal reflux disease)    Hypertension    Psychosis ([place])    PTSD (post-traumatic stress disorder)    Schizoaffective disorder    Seizure disorder ([place])    related to etoh seizure   Symptomatic anemia     Family History  Problem Relation Age of Onset   Alcoholism Mother    CAD Father    Depression Brother    Colon polyps Neg Hx    Esophageal cancer Neg Hx    Pancreatic cancer Neg Hx    Stomach cancer Neg Hx     Past Surgical History:  Procedure Laterality Date   ESOPHAGOGASTRODUODENOSCOPY (EGD) WITH PROPOFOL N/A 05/05/2020   Procedure: ESOPHAGOGASTRODUODENOSCOPY (EGD) WITH PROPOFOL;  Surgeon: [name], MD;  Location: [facility];  Service: Endoscopy;  Laterality: N/A;   FOOT SURGERY     right   HAND SURGERY     Social History   Occupational History   Not on file  Tobacco Use   Smoking status: Every Day    Packs/day: 1.50    Years: 30.00    Pack years: 45.00    Types: Cigarettes   Smokeless tobacco: Never   Vaping Use   Vaping Use: Never used  Substance and Sexual Activity   Alcohol use: Not Currently    Alcohol/week: 0.0 standard drinks   Drug use: Yes    Frequency: 1.0 times per week    Types: Marijuana   Sexual activity: Yes    Birth control/protection: Condom

## 2021-05-23 ENCOUNTER — Other Ambulatory Visit: Payer: Self-pay

## 2021-05-23 ENCOUNTER — Encounter (HOSPITAL_BASED_OUTPATIENT_CLINIC_OR_DEPARTMENT_OTHER): Payer: Self-pay | Admitting: Orthopedic Surgery

## 2021-05-30 NOTE — Progress Notes (Signed)
Message left as a reminder for patient to have preoperative labs completed. Patient asked to arrive at the surgery center by 11:30am tomorrow to complete labs if unable to come today.

## 2021-05-31 ENCOUNTER — Ambulatory Visit (HOSPITAL_BASED_OUTPATIENT_CLINIC_OR_DEPARTMENT_OTHER): Admission: RE | Admit: 2021-05-31 | Payer: Medicare Other | Source: Home / Self Care | Admitting: Orthopedic Surgery

## 2021-05-31 HISTORY — DX: Unspecified cirrhosis of liver: K74.60

## 2021-05-31 SURGERY — RELEASE, A1 PULLEY, FOR TRIGGER FINGER
Anesthesia: Monitor Anesthesia Care | Laterality: Right

## 2021-05-31 MED ORDER — MIDAZOLAM HCL 2 MG/2ML IJ SOLN
INTRAMUSCULAR | Status: AC
  Filled 2021-05-31: qty 2

## 2021-05-31 MED ORDER — FENTANYL CITRATE (PF) 100 MCG/2ML IJ SOLN
INTRAMUSCULAR | Status: AC
  Filled 2021-05-31: qty 2

## 2021-06-11 ENCOUNTER — Encounter: Payer: Medicare Other | Admitting: Orthopedic Surgery

## 2021-06-17 ENCOUNTER — Telehealth: Payer: Self-pay | Admitting: Family Medicine

## 2021-06-17 NOTE — Telephone Encounter (Signed)
  spironolactone (ALDACTONE) 100 MG tablet [[id number]]   gabapentin (NEURONTIN) 300 MG capsule [[id number]]  Pharmacy  [facility] [place] [address]  [place], [place] [address]  Phone:  [phone]  Fax:  [phone]

## 2021-06-18 NOTE — Telephone Encounter (Signed)
Patient has appt on 06/24/2021

## 2021-06-24 ENCOUNTER — Ambulatory Visit: Payer: Medicare Other | Admitting: Family Medicine

## 2021-07-28 ENCOUNTER — Emergency Department (HOSPITAL_COMMUNITY): Payer: Medicare Other

## 2021-07-28 ENCOUNTER — Encounter (HOSPITAL_COMMUNITY): Payer: Self-pay | Admitting: Emergency Medicine

## 2021-07-28 ENCOUNTER — Other Ambulatory Visit: Payer: Self-pay

## 2021-07-28 ENCOUNTER — Emergency Department (HOSPITAL_COMMUNITY)
Admission: EM | Admit: 2021-07-28 | Discharge: 2021-07-28 | Disposition: A | Discharge status: Home / Self Care | Payer: Medicare Other | Attending: Emergency Medicine | Admitting: Emergency Medicine

## 2021-07-28 DIAGNOSIS — R519 Headache, unspecified: Secondary | ICD-10-CM | POA: Insufficient documentation

## 2021-07-28 DIAGNOSIS — F17210 Nicotine dependence, cigarettes, uncomplicated: Secondary | ICD-10-CM | POA: Diagnosis not present

## 2021-07-28 DIAGNOSIS — I10 Essential (primary) hypertension: Secondary | ICD-10-CM | POA: Insufficient documentation

## 2021-07-28 DIAGNOSIS — Y908 Blood alcohol level of 240 mg/100 ml or more: Secondary | ICD-10-CM | POA: Insufficient documentation

## 2021-07-28 DIAGNOSIS — Z79899 Other long term (current) drug therapy: Secondary | ICD-10-CM | POA: Insufficient documentation

## 2021-07-28 DIAGNOSIS — F1010 Alcohol abuse, uncomplicated: Secondary | ICD-10-CM | POA: Insufficient documentation

## 2021-07-28 DIAGNOSIS — M79671 Pain in right foot: Secondary | ICD-10-CM | POA: Insufficient documentation

## 2021-07-28 LAB — COMPREHENSIVE METABOLIC PANEL
ALT: 25 U/L (ref 0–44)
AST: 31 U/L (ref 15–41)
Albumin: 4.6 g/dL (ref 3.5–5.0)
Alkaline Phosphatase: 144 U/L — ABNORMAL HIGH (ref 38–126)
Anion gap: 12 (ref 5–15)
BUN: 10 mg/dL (ref 6–20)
CO2: 29 mmol/L (ref 22–32)
Calcium: 8.8 mg/dL — ABNORMAL LOW (ref 8.9–10.3)
Chloride: 98 mmol/L (ref 98–111)
Creatinine, Ser: 0.73 mg/dL (ref 0.61–1.24)
GFR, Estimated: >60 mL/min (ref >60)
Glucose, Bld: 116 mg/dL — ABNORMAL HIGH (ref 70–99)
Potassium: 3.4 mmol/L — ABNORMAL LOW (ref 3.5–5.1)
Sodium: 139 mmol/L (ref 135–145)
Total Bilirubin: 0.8 mg/dL (ref 0.3–1.2)
Total Protein: 7.6 g/dL (ref 6.5–8.1)

## 2021-07-28 LAB — CBC WITH DIFFERENTIAL/PLATELET
Abs Immature Granulocytes: 0.03 10*3/uL (ref 0.00–0.07)
Basophils Absolute: 0.1 10*3/uL (ref 0.0–0.1)
Basophils Relative: 1 %
Eosinophils Absolute: 0.1 10*3/uL (ref 0.0–0.5)
Eosinophils Relative: 1 %
HCT: 41.1 % (ref 39.0–52.0)
Hemoglobin: 14.2 g/dL (ref 13.0–17.0)
Immature Granulocytes: 0 %
Lymphocytes Relative: 51 %
Lymphs Abs: 5.1 10*3/uL — ABNORMAL HIGH (ref 0.7–4.0)
MCH: 31.1 pg (ref 26.0–34.0)
MCHC: 34.5 g/dL (ref 30.0–36.0)
MCV: 89.9 fL (ref 80.0–100.0)
Monocytes Absolute: 0.7 10*3/uL (ref 0.1–1.0)
Monocytes Relative: 7 %
Neutro Abs: 4.1 10*3/uL (ref 1.7–7.7)
Neutrophils Relative %: 40 %
Platelets: 293 10*3/uL (ref 150–400)
RBC: 4.57 MIL/uL (ref 4.22–5.81)
RDW: 14.2 % (ref 11.5–15.5)
WBC: 10.1 10*3/uL (ref 4.0–10.5)
nRBC: 0 % (ref 0.0–0.2)

## 2021-07-28 LAB — LIPASE, BLOOD: Lipase: 59 U/L — ABNORMAL HIGH (ref 11–51)

## 2021-07-28 LAB — PROTIME-INR
INR: 0.9 (ref 0.8–1.2)
Prothrombin Time: 12.5 seconds (ref 11.4–15.2)

## 2021-07-28 LAB — ETHANOL: Alcohol, Ethyl (B): 331 mg/dL (ref <10)

## 2021-07-28 LAB — MAGNESIUM: Magnesium: 2.1 mg/dL (ref 1.7–2.4)

## 2021-07-28 MED ORDER — METOCLOPRAMIDE HCL 5 MG/ML IJ SOLN
10.0000 mg | Freq: Once | INTRAMUSCULAR | Status: AC
  Administered 2021-07-28: 10 mg via INTRAVENOUS
  Filled 2021-07-28: qty 2

## 2021-07-28 MED ORDER — IBUPROFEN 200 MG PO TABS
600.0000 mg | ORAL_TABLET | Freq: Once | ORAL | Status: AC
  Administered 2021-07-28: 600 mg via ORAL
  Filled 2021-07-28: qty 3

## 2021-07-28 MED ORDER — POTASSIUM CHLORIDE CRYS ER 20 MEQ PO TBCR
40.0000 meq | EXTENDED_RELEASE_TABLET | Freq: Once | ORAL | Status: AC
  Administered 2021-07-28: 40 meq via ORAL
  Filled 2021-07-28: qty 2

## 2021-07-28 MED ORDER — LACTATED RINGERS IV BOLUS
1000.0000 mL | Freq: Once | INTRAVENOUS | Status: AC
  Administered 2021-07-28: 1000 mL via INTRAVENOUS

## 2021-07-28 MED ORDER — CHLORDIAZEPOXIDE HCL 25 MG PO CAPS
50.0000 mg | ORAL_CAPSULE | Freq: Once | ORAL | Status: AC
  Administered 2021-07-28: 50 mg via ORAL
  Filled 2021-07-28: qty 2

## 2021-07-28 MED ORDER — CHLORDIAZEPOXIDE HCL 25 MG PO CAPS: ORAL_CAPSULE | ORAL | 0 refills | Status: DC

## 2021-07-28 MED ORDER — THIAMINE HCL 100 MG PO TABS
100.0000 mg | ORAL_TABLET | Freq: Once | ORAL | Status: AC
  Administered 2021-07-28: 100 mg via ORAL
  Filled 2021-07-28: qty 1

## 2021-07-28 MED ORDER — LORAZEPAM 1 MG PO TABS
1.0000 mg | ORAL_TABLET | Freq: Once | ORAL | Status: AC
  Administered 2021-07-28: 1 mg via ORAL
  Filled 2021-07-28: qty 1

## 2021-07-28 NOTE — Discharge Instructions (Signed)
If you develop continued, recurrent, or worsening headache, fever, neck stiffness, vomiting, blurry or double vision, weakness or numbness in your arms or legs, trouble speaking, or any other new/concerning symptoms then return to the ER for evaluation.  

## 2021-07-28 NOTE — ED Triage Notes (Signed)
Pt reports he last drank alcohol earlier this am

## 2021-07-28 NOTE — ED Triage Notes (Signed)
Pt BIB [facility] for alcohol withdrawal, pt states he drinks until he passes out daily for the past week or so and smokes weed; pt called EMS for tremors, n/v/d, headache and anxiety; v/s WNL

## 2021-07-28 NOTE — ED Provider Notes (Addendum)
[facility] DEPT Provider Note   CSN: [id number] Arrival date & time: 07/28/21  0645     History Chief Complaint  Patient presents with   Withdrawal    alcohol    [name] is a 50 y.o. male.  HPI 50 year old male presents with concern for alcohol withdrawal.  He states that he has been drinking wine heavily for the last week.  He does not know how much.  Last drink last night.  He has not drank today because he wants to come off of alcohol.  He denies feeling suicidal/homicidal.  He states he has been having an occipital headache since yesterday as well as vomiting since yesterday.  He has some diffuse abdominal discomfort that he states he feels like is from nausea.  He now feels shaky since waking up this morning and tremulous.  He denies fevers, cough, chest pain, shortness of breath.  He states he fell when he was intoxicated a couple days ago and feels like he sprained his right foot.    Past Medical History:  Diagnosis Date   Alcohol abuse    Anxiety    at age 7   Blood transfusion without reported diagnosis    Cirrhosis ([place])    Depression    at age 81   GERD (gastroesophageal reflux disease)    Hypertension    Psychosis ([place])    PTSD (post-traumatic stress disorder)    Schizoaffective disorder    Seizure disorder ([place])    related to etoh seizure   Symptomatic anemia     Patient Active Problem List   Diagnosis Date Noted   Trigger finger, right middle finger 53/29/9242   Alcoholic hepatitis with ascites    Acute respiratory failure ([facility])    Pressure injury of skin 07/17/2020   Encephalopathy, hepatic 68/34/1962   Alcoholic cirrhosis of liver with ascites ([place])    Hypomagnesemia 05/03/2020   Hyperbilirubinemia 05/03/2020   D-dimer, elevated 05/03/2020   Alcoholism ([place]) 05/03/2020   Rhabdomyolysis 22/97/9892   Alcoholic hepatitis without ascites 03/06/2019   Alcohol withdrawal ([place]) 01/15/2019   HTN (hypertension)  01/10/2019   Fall 01/10/2019   Alcohol intoxication ([place]) 01/10/2019   Schizoaffective disorder, bipolar type ([place]) 06/02/2018   Alcohol dependence with uncomplicated withdrawal ([place]) 02/22/2016   Alcohol-induced mood disorder ([place]) 02/22/2016   Gout 10/22/2015   GERD (gastroesophageal reflux disease) 10/22/2015   Alcohol withdrawal seizure with complication ([place]) 11/94/1740   Schizoaffective disorder, depressive type ([place])    History of alcohol abuse    Tobacco dependence due to cigarettes 11/20/2014   Alcohol abuse with intoxication ([place])    Hyponatremia 10/18/2014   Abnormal LFTs    Alcohol abuse 02/09/2014   Post traumatic stress disorder (PTSD) 11/03/2011   Hypokalemia 07/02/2011    Past Surgical History:  Procedure Laterality Date   ESOPHAGOGASTRODUODENOSCOPY (EGD) WITH PROPOFOL N/A 05/05/2020   Procedure: ESOPHAGOGASTRODUODENOSCOPY (EGD) WITH PROPOFOL;  Surgeon: [name], MD;  Location: [facility];  Service: Endoscopy;  Laterality: N/A;   FOOT SURGERY     right   HAND SURGERY         Family History  Problem Relation Age of Onset   Alcoholism Mother    CAD Father    Depression Brother    Colon polyps Neg Hx    Esophageal cancer Neg Hx    Pancreatic cancer Neg Hx    Stomach cancer Neg Hx     Social History   Tobacco Use  Smoking status: Every Day    Packs/day: 1.50    Years: 30.00    Pack years: 45.00    Types: Cigarettes   Smokeless tobacco: Never  Vaping Use   Vaping Use: Never used  Substance Use Topics   Alcohol use: Yes    Comment: "until I pass out"   Drug use: Yes    Frequency: 1.0 times per week    Types: Marijuana    Home Medications Prior to Admission medications   Medication Sig Start Date End Date Taking? Authorizing Provider  chlordiazePOXIDE (LIBRIUM) 25 MG capsule 50mg  PO TID x 1D, then 25-50mg  PO BID X 1D, then 25-50mg  PO QD X 1D 07/28/21  Yes Sherwood Gambler, MD  gabapentin (NEURONTIN) 300 MG capsule Take 1 capsule  (300 mg total) by mouth 3 (three) times daily. 05/15/21   [name], MD  lactulose (CHRONULAC) 10 GM/15ML solution Take 45 mLs (30 g total) by mouth 3 (three) times daily. 05/15/21   [name], MD  Multiple Vitamin (MULTIVITAMIN WITH MINERALS) TABS tablet Take 1 tablet by mouth daily. 05/08/20   [place] Eric J, DO  pantoprazole (PROTONIX) 40 MG tablet Take 1 tablet (40 mg total) by mouth 2 (two) times daily. 05/15/21   [name], MD  QUEtiapine (SEROQUEL) 200 MG tablet Take 1 tablet (200 mg total) by mouth at bedtime. 05/15/21   [name], MD  QUEtiapine (SEROQUEL) 50 MG tablet Take 1 tablet (50 mg total) by mouth at bedtime. 03/07/21 06/05/21  Camillia Herter, NP  sertraline (ZOLOFT) 25 MG tablet Take 1 tablet (25 mg total) by mouth daily. 05/15/21   [name], MD  spironolactone (ALDACTONE) 100 MG tablet Take 1 tablet (100 mg total) by mouth daily. 05/15/21   [name], MD  vitamin C (VITAMIN C) 250 MG tablet Take 1 tablet (250 mg total) by mouth 2 (two) times daily. 08/06/20   [name], MD    Allergies    Bupropion  Review of Systems   Review of Systems  Constitutional:  Negative for fever.  Respiratory:  Negative for cough and shortness of breath.   Cardiovascular:  Negative for chest pain.  Gastrointestinal:  Positive for vomiting. Negative for abdominal pain and diarrhea.  Neurological:  Positive for tremors and headaches.  Psychiatric/Behavioral:  Negative for suicidal ideas.   All other systems reviewed and are negative.  Physical Exam Updated Vital Signs BP 108/69   Pulse 86   Temp 98.2 F (36.8 C)   Resp (!) 21   Ht 6' (1.829 m)   Wt 90.7 kg   SpO2 98%   BMI 27.12 kg/m   Physical Exam Vitals and nursing note reviewed.  Constitutional:      General: He is not in acute distress.    Appearance: He is well-developed. He is not ill-appearing or diaphoretic.  HENT:     Head: Normocephalic and atraumatic.     Right Ear: External ear normal.      Left Ear: External ear normal.     Nose: Nose normal.  Eyes:     General:        Right eye: No discharge.        Left eye: No discharge.     Extraocular Movements: Extraocular movements intact.     Pupils: Pupils are equal, round, and reactive to light.  Cardiovascular:     Rate and Rhythm: Normal rate and regular rhythm.     Pulses:  Dorsalis pedis pulses are 2+ on the right side.     Heart sounds: Normal heart sounds.  Pulmonary:     Effort: Pulmonary effort is normal.     Breath sounds: Normal breath sounds.  Abdominal:     General: There is no distension.     Palpations: Abdomen is soft.     Tenderness: There is no abdominal tenderness.  Musculoskeletal:     Cervical back: Neck supple. No rigidity.     Comments: Mild tenderness to plantar foot on right. No swelling  Skin:    General: Skin is warm and dry.  Neurological:     Mental Status: He is alert and oriented to person, place, and time.     Comments: CN 3-12 grossly intact. 5/5 strength in all 4 extremities. Grossly normal sensation. Normal finger to nose. No significant tremor seen at rest or with active use of extremities  Psychiatric:        Mood and Affect: Mood is not anxious.    ED Results / Procedures / Treatments   Labs (all labs ordered are listed, but only abnormal results are displayed) Labs Reviewed  COMPREHENSIVE METABOLIC PANEL - Abnormal; Notable for the following components:      Result Value   Potassium 3.4 (*)    Glucose, Bld 116 (*)    Calcium 8.8 (*)    Alkaline Phosphatase 144 (*)    All other components within normal limits  ETHANOL - Abnormal; Notable for the following components:   Alcohol, Ethyl (B) 331 (*)    All other components within normal limits  CBC WITH DIFFERENTIAL/PLATELET - Abnormal; Notable for the following components:   Lymphs Abs 5.1 (*)    All other components within normal limits  LIPASE, BLOOD - Abnormal; Notable for the following components:   Lipase 59  (*)    All other components within normal limits  PROTIME-INR  MAGNESIUM  RAPID URINE DRUG SCREEN, HOSP PERFORMED  URINALYSIS, ROUTINE W REFLEX MICROSCOPIC  AMMONIA    EKG EKG Interpretation  Date/Time:  Sunday July 28 2021 08:21:55 EST Ventricular Rate:  74 PR Interval:  153 QRS Duration: 85 QT Interval:  397 QTC Calculation: 441 R Axis:   79 Text Interpretation: Sinus rhythm no acute ST/T changes Confirmed by Sherwood Gambler [phone]) on 07/28/2021 8:23:56 AM  Radiology CT HEAD WO CONTRAST (5MM)  Result Date: 07/28/2021 CLINICAL DATA:  Headache.  Alcohol withdrawal. EXAM: CT HEAD WITHOUT CONTRAST TECHNIQUE: Contiguous axial images were obtained from the base of the skull through the vertex without intravenous contrast. COMPARISON:  July 16, 2020. FINDINGS: Brain: No evidence of acute infarction, hemorrhage, hydrocephalus, extra-axial collection or mass lesion/mass effect. Vascular: No hyperdense vessel or unexpected calcification. Skull: Normal. Negative for fracture or focal lesion. Sinuses/Orbits: Left maxillary sinusitis is noted. Other: None. IMPRESSION: No acute intracranial abnormality seen. Electronically Signed   By: Marijo Conception M.D.   On: 07/28/2021 07:47    Procedures Procedures   Medications Ordered in ED Medications  chlordiazePOXIDE (LIBRIUM) capsule 50 mg (has no administration in time range)  ibuprofen (ADVIL) tablet 600 mg (has no administration in time range)  metoCLOPramide (REGLAN) injection 10 mg (10 mg Intravenous Given 07/28/21 0805)  lactated ringers bolus 1,000 mL (0 mLs Intravenous Stopped 07/28/21 0852)  thiamine tablet 100 mg (100 mg Oral Given 07/28/21 0804)  LORazepam (ATIVAN) tablet 1 mg (1 mg Oral Given 07/28/21 0804)  potassium chloride SA (KLOR-CON M) CR tablet 40 mEq (40 mEq Oral  Given 07/28/21 0851)    ED Course  I have reviewed the triage vital signs and the nursing notes.  Pertinent labs & imaging results that were available  during my care of the patient were reviewed by me and considered in my medical decision making (see chart for details).    MDM Rules/Calculators/A&P                           Patient is concerned about alcohol withdrawal but based on exam I have pretty low suspicion he is acutely withdrawing.  Last seem to drink last night and has significant amount alcohol in his system.  While he can certainly have alcohol withdrawal while having alcohol in his system, I have a much lower suspicion and so I do not think he needs further work-up or treatment.  I will give him Librium as he wants to stop drinking to help prevent withdrawal.  He is not currently intoxicated.  His labs are otherwise benign besides some mild hypokalemia.  CT head was obtained given he has fallen recently and has been intoxicated.  This is unremarkable.  Unclear why is having a headache but with no fever, normal WBC, no meningismus, I highly doubt acute CNS infection.  No stroke symptoms.  He is not suicidal or psychotic.  At this point he appears stable for discharge home with resources and short taper of Librium.  Of note, patient feels like he sprained his foot.  He declines x-ray here and has been ambulatory so I have lower suspicion of foot fracture anyway. Final Clinical Impression(s) / ED Diagnoses Final diagnoses:  Alcohol abuse    Rx / DC Orders ED Discharge Orders          Ordered    chlordiazePOXIDE (LIBRIUM) 25 MG capsule        07/28/21 0932             Sherwood Gambler, MD 07/28/21 4503    Sherwood Gambler, MD 07/28/21 [phone]

## 2021-08-07 ENCOUNTER — Telehealth: Payer: Self-pay | Admitting: Family Medicine

## 2021-08-07 NOTE — Telephone Encounter (Signed)
lactulose (CHRONULAC) 10 GM/15ML solution [[id number]]   QUEtiapine (SEROQUEL) 200 MG tablet [[id number]]   Pharmacy  [facility] [place], [place] [address]  [place], [place] [address]  Phone:  [phone]  Fax:  [phone]

## 2021-08-07 NOTE — Telephone Encounter (Signed)
Patient given an appt

## 2021-08-26 ENCOUNTER — Emergency Department (HOSPITAL_COMMUNITY): Payer: Medicare Other

## 2021-08-26 ENCOUNTER — Other Ambulatory Visit: Payer: Self-pay

## 2021-08-26 ENCOUNTER — Encounter (HOSPITAL_COMMUNITY): Payer: Self-pay

## 2021-08-26 ENCOUNTER — Emergency Department (HOSPITAL_COMMUNITY)
Admission: EM | Admit: 2021-08-26 | Discharge: 2021-08-27 | Disposition: A | Discharge status: Home / Self Care | Payer: Medicare Other | Attending: Emergency Medicine | Admitting: Emergency Medicine

## 2021-08-26 DIAGNOSIS — R45851 Suicidal ideations: Secondary | ICD-10-CM | POA: Insufficient documentation

## 2021-08-26 DIAGNOSIS — R519 Headache, unspecified: Secondary | ICD-10-CM | POA: Insufficient documentation

## 2021-08-26 DIAGNOSIS — F1010 Alcohol abuse, uncomplicated: Secondary | ICD-10-CM

## 2021-08-26 DIAGNOSIS — R109 Unspecified abdominal pain: Secondary | ICD-10-CM | POA: Diagnosis not present

## 2021-08-26 DIAGNOSIS — Z79899 Other long term (current) drug therapy: Secondary | ICD-10-CM | POA: Insufficient documentation

## 2021-08-26 DIAGNOSIS — Z20822 Contact with and (suspected) exposure to covid-19: Secondary | ICD-10-CM | POA: Diagnosis not present

## 2021-08-26 DIAGNOSIS — Y906 Blood alcohol level of 120-199 mg/100 ml: Secondary | ICD-10-CM | POA: Insufficient documentation

## 2021-08-26 DIAGNOSIS — F1014 Alcohol abuse with alcohol-induced mood disorder: Secondary | ICD-10-CM | POA: Diagnosis present

## 2021-08-26 LAB — CBC WITH DIFFERENTIAL/PLATELET
Abs Immature Granulocytes: 0.01 10*3/uL (ref 0.00–0.07)
Basophils Absolute: 0.1 10*3/uL (ref 0.0–0.1)
Basophils Relative: 1 %
Eosinophils Absolute: 0 10*3/uL (ref 0.0–0.5)
Eosinophils Relative: 1 %
HCT: 40.5 % (ref 39.0–52.0)
Hemoglobin: 14.2 g/dL (ref 13.0–17.0)
Immature Granulocytes: 0 %
Lymphocytes Relative: 36 %
Lymphs Abs: 1.9 10*3/uL (ref 0.7–4.0)
MCH: 32.6 pg (ref 26.0–34.0)
MCHC: 35.1 g/dL (ref 30.0–36.0)
MCV: 93.1 fL (ref 80.0–100.0)
Monocytes Absolute: 0.6 10*3/uL (ref 0.1–1.0)
Monocytes Relative: 11 %
Neutro Abs: 2.7 10*3/uL (ref 1.7–7.7)
Neutrophils Relative %: 51 %
Platelets: 163 10*3/uL (ref 150–400)
RBC: 4.35 MIL/uL (ref 4.22–5.81)
RDW: 14.1 % (ref 11.5–15.5)
WBC: 5.4 10*3/uL (ref 4.0–10.5)
nRBC: 0 % (ref 0.0–0.2)

## 2021-08-26 LAB — COMPREHENSIVE METABOLIC PANEL
ALT: 31 U/L (ref 0–44)
AST: 53 U/L — ABNORMAL HIGH (ref 15–41)
Albumin: 4.6 g/dL (ref 3.5–5.0)
Alkaline Phosphatase: 166 U/L — ABNORMAL HIGH (ref 38–126)
Anion gap: 15 (ref 5–15)
BUN: 6 mg/dL (ref 6–20)
CO2: 29 mmol/L (ref 22–32)
Calcium: 9.2 mg/dL (ref 8.9–10.3)
Chloride: 91 mmol/L — ABNORMAL LOW (ref 98–111)
Creatinine, Ser: 0.73 mg/dL (ref 0.61–1.24)
GFR, Estimated: >60 mL/min (ref >60)
Glucose, Bld: 128 mg/dL — ABNORMAL HIGH (ref 70–99)
Potassium: 3.1 mmol/L — ABNORMAL LOW (ref 3.5–5.1)
Sodium: 135 mmol/L (ref 135–145)
Total Bilirubin: 1.5 mg/dL — ABNORMAL HIGH (ref 0.3–1.2)
Total Protein: 7.7 g/dL (ref 6.5–8.1)

## 2021-08-26 LAB — RAPID URINE DRUG SCREEN, HOSP PERFORMED
Amphetamines: NOT DETECTED
Barbiturates: NOT DETECTED
Benzodiazepines: POSITIVE — AB
Cocaine: NOT DETECTED
Opiates: NOT DETECTED
Tetrahydrocannabinol: POSITIVE — AB

## 2021-08-26 LAB — URINALYSIS, ROUTINE W REFLEX MICROSCOPIC
Bacteria, UA: NONE SEEN
Bilirubin Urine: NEGATIVE
Glucose, UA: NEGATIVE mg/dL
Hgb urine dipstick: NEGATIVE
Ketones, ur: NEGATIVE mg/dL
Leukocytes,Ua: NEGATIVE
Nitrite: NEGATIVE
Protein, ur: 100 mg/dL — AB
Specific Gravity, Urine: 1.039 — ABNORMAL HIGH (ref 1.005–1.030)
pH: 7 (ref 5.0–8.0)

## 2021-08-26 LAB — TYPE AND SCREEN
ABO/RH(D): O POS
Antibody Screen: NEGATIVE

## 2021-08-26 LAB — ETHANOL: Alcohol, Ethyl (B): 161 mg/dL — ABNORMAL HIGH (ref <10)

## 2021-08-26 LAB — SALICYLATE LEVEL: Salicylate Lvl: <7 mg/dL — ABNORMAL LOW (ref 7.0–30.0)

## 2021-08-26 LAB — RESP PANEL BY RT-PCR (FLU A&B, COVID) ARPGX2
Influenza A by PCR: NEGATIVE
Influenza B by PCR: NEGATIVE
SARS Coronavirus 2 by RT PCR: NEGATIVE

## 2021-08-26 LAB — ACETAMINOPHEN LEVEL: Acetaminophen (Tylenol), Serum: <10 ug/mL — ABNORMAL LOW (ref 10–30)

## 2021-08-26 LAB — LIPASE, BLOOD: Lipase: 31 U/L (ref 11–51)

## 2021-08-26 MED ORDER — LORAZEPAM 1 MG PO TABS
0.0000 mg | ORAL_TABLET | Freq: Four times a day (QID) | ORAL | Status: DC
  Administered 2021-08-27: 1 mg via ORAL
  Administered 2021-08-27: 2 mg via ORAL
  Filled 2021-08-26 (×2): qty 2

## 2021-08-26 MED ORDER — PANTOPRAZOLE SODIUM 40 MG IV SOLR
40.0000 mg | Freq: Once | INTRAVENOUS | Status: AC
  Administered 2021-08-26: 40 mg via INTRAVENOUS
  Filled 2021-08-26: qty 40

## 2021-08-26 MED ORDER — THIAMINE HCL 100 MG/ML IJ SOLN: 100.0000 mg | Freq: Every day | INTRAMUSCULAR | Status: DC

## 2021-08-26 MED ORDER — LORAZEPAM 2 MG/ML IJ SOLN
0.0000 mg | Freq: Four times a day (QID) | INTRAMUSCULAR | Status: DC
  Administered 2021-08-26 – 2021-08-27 (×4): 2 mg via INTRAVENOUS
  Filled 2021-08-26 (×4): qty 1

## 2021-08-26 MED ORDER — IOHEXOL 350 MG/ML SOLN
80.0000 mL | Freq: Once | INTRAVENOUS | Status: AC | PRN
  Administered 2021-08-26: 80 mL via INTRAVENOUS

## 2021-08-26 MED ORDER — LORAZEPAM 2 MG/ML IJ SOLN
2.0000 mg | Freq: Once | INTRAMUSCULAR | Status: AC
  Administered 2021-08-26: 2 mg via INTRAVENOUS
  Filled 2021-08-26: qty 1

## 2021-08-26 MED ORDER — THIAMINE HCL 100 MG PO TABS
100.0000 mg | ORAL_TABLET | Freq: Every day | ORAL | Status: DC
  Administered 2021-08-26 – 2021-08-27 (×2): 100 mg via ORAL
  Filled 2021-08-26 (×2): qty 1

## 2021-08-26 MED ORDER — POTASSIUM CHLORIDE CRYS ER 20 MEQ PO TBCR
40.0000 meq | EXTENDED_RELEASE_TABLET | Freq: Once | ORAL | Status: AC
  Administered 2021-08-26: 40 meq via ORAL
  Filled 2021-08-26: qty 2

## 2021-08-26 MED ORDER — LORAZEPAM 1 MG PO TABS: 0.0000 mg | ORAL_TABLET | Freq: Two times a day (BID) | ORAL | Status: DC

## 2021-08-26 MED ORDER — LORAZEPAM 2 MG/ML IJ SOLN: 0.0000 mg | Freq: Two times a day (BID) | INTRAMUSCULAR | Status: DC

## 2021-08-26 MED ORDER — POTASSIUM CHLORIDE CRYS ER 20 MEQ PO TBCR
60.0000 meq | EXTENDED_RELEASE_TABLET | Freq: Once | ORAL | Status: AC
  Administered 2021-08-26: 60 meq via ORAL
  Filled 2021-08-26: qty 3

## 2021-08-26 MED ORDER — ONDANSETRON HCL 4 MG/2ML IJ SOLN
4.0000 mg | Freq: Once | INTRAMUSCULAR | Status: AC
  Administered 2021-08-26: 4 mg via INTRAVENOUS
  Filled 2021-08-26: qty 2

## 2021-08-26 MED ORDER — SODIUM CHLORIDE 0.9 % IV BOLUS
1000.0000 mL | Freq: Once | INTRAVENOUS | Status: AC
  Administered 2021-08-26: 1000 mL via INTRAVENOUS

## 2021-08-26 NOTE — ED Triage Notes (Signed)
Patient BIB [facility] from home. Patient said he thinks he is withdrawing and he thinks he had a seizure. History of alcohol abuse. Last drink was yesterday, unknown of time. He said he drinks until he passes out. Has cirrhosis of the liver and a headache. History of withdrawal seizures. Patient is also suicidal. His plan is to drink himself to death.
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
## 2021-08-26 NOTE — ED Provider Notes (Signed)
Pt received 2mg  of ativan.  Pt doe not seem to  require admission for withdrawal.  I will consult TTS    Fransico Meadow, PA-C 08/26/21 1318    Horton, Alvin Critchley, DO 08/26/21 1500

## 2021-08-26 NOTE — ED Provider Notes (Signed)
[place] DEPT Provider Note   CSN: [id number] Arrival date & time: 08/26/21  4536     History  Chief Complaint  Patient presents with   Suicidal   Alcohol Problem    [name] is a 51 y.o. male.  HPI Patient is a 51 year old male with a history of schizoaffective disorder, hepatic encephalopathy, alcoholic cirrhosis of the liver with ascites, chronic alcoholism, PTSD, seizure disorder, symptomatic anemia, who presents to the emergency department due to possible alcohol withdrawals.  Patient states that he typically drinks about 5, 4 Loco's per day.  He states that he drinks on a daily basis and last drank yesterday evening.  He states that he feels suicidal and is attempting to kill himself by drinking too much alcohol.  He states that he drinks alcohol every day until "he passes out".  He states he has a history of withdrawal seizures and feels that he might have had a seizure in the middle of the night, noting that he woke up on the floor and is unsure of how he got there.  He reports upper abdominal pain as well as a headache.  He reports nausea and vomiting yesterday that he describes as "coffee ground".  Denies any hematochezia or melena.  Denies any current nausea.  No chest pain or shortness of breath.    Home Medications Prior to Admission medications   Medication Sig Start Date End Date Taking? Authorizing Provider  gabapentin (NEURONTIN) 300 MG capsule Take 1 capsule (300 mg total) by mouth 3 (three) times daily. 05/15/21  Yes [name], MD  lactulose (CHRONULAC) 10 GM/15ML solution Take 45 mLs (30 g total) by mouth 3 (three) times daily. 05/15/21  Yes [name], MD  MILK THISTLE PO Take 1 capsule by mouth 3 (three) times daily.   Yes [provider]  pantoprazole (PROTONIX) 40 MG tablet Take 1 tablet (40 mg total) by mouth 2 (two) times daily. 05/15/21  Yes [name], MD  QUEtiapine (SEROQUEL) 200 MG tablet Take 1  tablet (200 mg total) by mouth at bedtime. 05/15/21  Yes [name], MD  sertraline (ZOLOFT) 25 MG tablet Take 1 tablet (25 mg total) by mouth daily. 05/15/21  Yes [name], MD  spironolactone (ALDACTONE) 100 MG tablet Take 1 tablet (100 mg total) by mouth daily. 05/15/21  Yes [name], MD  vitamin C (VITAMIN C) 250 MG tablet Take 1 tablet (250 mg total) by mouth 2 (two) times daily. 08/06/20  Yes [name], MD  chlordiazePOXIDE (LIBRIUM) 25 MG capsule 56m PO TID x 1D, then 25-554mPO BID X 1D, then 25-5091mO QD X 1D Patient not taking: Reported on 08/26/2021 07/28/21   [place] GamblerD  Multiple Vitamin (MULTIVITAMIN WITH MINERALS) TABS tablet Take 1 tablet by mouth daily. Patient not taking: Reported on 08/26/2021 05/08/20   [place] J, DO      Allergies    Bupropion    Review of Systems   Review of Systems  All other systems reviewed and are negative. Ten systems reviewed and are negative for acute change, except as noted in the HPI.   Physical Exam Updated Vital Signs BP (!) 133/95    Pulse 83    Temp 98.2 F (36.8 C) (Oral)    Resp 19    Ht 6' (1.829 m)    Wt 88.5 kg    SpO2 96%    BMI 26.45 kg/m  Physical Exam Vitals and nursing note reviewed.  Constitutional:      General: He is not in acute distress.    Appearance: Normal appearance. He is not ill-appearing, toxic-appearing or diaphoretic.  HENT:     Head: Normocephalic and atraumatic.     Right Ear: External ear normal.     Left Ear: External ear normal.     Nose: Nose normal.     Mouth/Throat:     Mouth: Mucous membranes are moist.     Pharynx: Oropharynx is clear. No oropharyngeal exudate or posterior oropharyngeal erythema.     Comments: No tongue trauma noted. Eyes:     Extraocular Movements: Extraocular movements intact.  Neck:     Comments: Mild TTP noted to the midline cervical spine.  Cardiovascular:     Rate and Rhythm: Normal rate and regular rhythm.     Pulses: Normal pulses.      Heart sounds: Normal heart sounds. No murmur heard.   No friction rub. No gallop.  Pulmonary:     Effort: Pulmonary effort is normal. No respiratory distress.     Breath sounds: Normal breath sounds. No stridor. No wheezing, rhonchi or rales.  Abdominal:     General: Abdomen is flat.     Palpations: Abdomen is soft.     Tenderness: There is abdominal tenderness.     Comments: Protuberant abdomen that is soft.  Mild tenderness noted diffusely that appears to be worst along the epigastrium.  Musculoskeletal:        General: Normal range of motion.     Cervical back: Normal range of motion and neck supple. Tenderness present.  Skin:    General: Skin is warm and dry.  Neurological:     General: No focal deficit present.     Mental Status: He is alert and oriented to person, place, and time.     Comments: A&O x3.  Moving all 4 extremities with ease.  Speaking clearly and coherently.  No gross deficits.  Mild tremor noted with hands fully extended.  Psychiatric:        Mood and Affect: Mood normal.        Behavior: Behavior normal.   ED Results / Procedures / Treatments   Labs (all labs ordered are listed, but only abnormal results are displayed) Labs Reviewed  COMPREHENSIVE METABOLIC PANEL - Abnormal; Notable for the following components:      Result Value   Potassium 3.1 (*)    Chloride 91 (*)    Glucose, Bld 128 (*)    AST 53 (*)    Alkaline Phosphatase 166 (*)    Total Bilirubin 1.5 (*)    All other components within normal limits  SALICYLATE LEVEL - Abnormal; Notable for the following components:   Salicylate Lvl <6.3 (*)    All other components within normal limits  ETHANOL - Abnormal; Notable for the following components:   Alcohol, Ethyl (B) 161 (*)    All other components within normal limits  ACETAMINOPHEN LEVEL - Abnormal; Notable for the following components:   Acetaminophen (Tylenol), Serum <10 (*)    All other components within normal limits  RESP PANEL BY RT-PCR  (FLU A&B, COVID) ARPGX2  CBC WITH DIFFERENTIAL/PLATELET  LIPASE, BLOOD  URINALYSIS, ROUTINE W REFLEX MICROSCOPIC  RAPID URINE DRUG SCREEN, HOSP PERFORMED  TYPE AND SCREEN   EKG EKG Interpretation  Date/Time:  Monday August 26 2021 05:14:22 EST Ventricular Rate:  69 PR Interval:  152 QRS Duration: 88 QT Interval:  400 QTC Calculation: 429 R Axis:  69 Text Interpretation: Sinus rhythm Confirmed by Regan Lemming ([id number]) on 08/26/2021 5:45:48 AM  Radiology No results found.  Procedures Procedures   Medications Ordered in ED Medications  LORazepam (ATIVAN) injection 0-4 mg (2 mg Intravenous Given 08/26/21 0523)    Or  LORazepam (ATIVAN) tablet 0-4 mg ( Oral See Alternative 08/26/21 0523)  LORazepam (ATIVAN) injection 0-4 mg (has no administration in time range)    Or  LORazepam (ATIVAN) tablet 0-4 mg (has no administration in time range)  thiamine tablet 100 mg (has no administration in time range)    Or  thiamine (B-1) injection 100 mg (has no administration in time range)  ondansetron (ZOFRAN) injection 4 mg (4 mg Intravenous Given 08/26/21 0522)  pantoprazole (PROTONIX) injection 40 mg (40 mg Intravenous Given 08/26/21 0522)  sodium chloride 0.9 % bolus 1,000 mL (1,000 mLs Intravenous New Bag/Given 08/26/21 0628)  potassium chloride SA (KLOR-CON M) CR tablet 60 mEq (60 mEq Oral Given 08/26/21 6962)   ED Course/ Medical Decision Making/ A&P Clinical Course as of 08/26/21 0638  Mon Aug 26, 2021  0611 Initial CIWA of 14.  Patient given 2 mg of IV Ativan. [LJ]    Clinical Course User Index [LJ] Rayna Sexton, PA-C                           Medical Decision Making Pt is a 51 y.o. male with a complex medical history who presents to the emergency department due to alcohol abuse, concern for alcohol withdrawal, as well as concern for a possible seizure.  Labs: CBC without abnormalities. CMP with a potassium of 3.1, chloride of 91, glucose of 128, AST of 53, alk phos of 166, total  bilirubin of 1.5. Lipase of 31. Acetaminophen less than 10. Salicylate less than 7. Respiratory panel is negative. Ethanol of 161. UDS and UA are pending.  Imaging: CT scan of the head, cervical spine, abdomen, and pelvis are pending.  I, Rayna Sexton, PA-C, personally reviewed and evaluated these images and lab results as part of my medical decision-making.  Patient does have a history of withdrawal seizures and is concerned that he might have had a seizure last night.  He does note that he drank his usual amount of alcohol yesterday but feels as if he is currently withdrawing.  No tongue bites noted on my exam.  Patient does have some mild tenderness to the midline cervical spine and also endorses a headache so CT scans were ordered of the head and neck.  Patient has tenderness in the upper abdomen as well.  We will obtain a CT scan of the abdomen/pelvis.  Initial CIWA 14.  Patient with a mild tremor on my exam.  Currently A&O x3 and answering questions clearly.  Patient given 2 mg of IV Ativan.  Lab work significant for a potassium of 3.1.  This was repleted with Klor-Con.  Hypochloremic at 91 with a sodium of 135.  Patient started on a bolus of normal saline.  It is in my shift and patient care is being transferred to [facility].  Patient pending CT scans of the head, cervical spine, as well as the abdomen/pelvis.  Also pending UDS and UA.  Disposition pending based on these results.  Note: Portions of this report may have been transcribed using voice recognition software. Every effort was made to ensure accuracy; however, inadvertent computerized transcription errors may be present.   Final Clinical Impression(s) / ED Diagnoses Final  diagnoses:  Alcohol abuse  Suicidal thoughts   Rx / DC Orders ED Discharge Orders     None         Rayna Sexton, PA-C 08/26/21 6720    Regan Lemming, MD 08/26/21 [phone]

## 2021-08-26 NOTE — ED Notes (Addendum)
Patient has 2 patient belonging bags across from room 18. Patient changed out into burgundy scrubs. Seizure pads placed on side rails.

## 2021-08-27 ENCOUNTER — Ambulatory Visit: Payer: Medicare Other | Admitting: Family Medicine

## 2021-08-27 DIAGNOSIS — F1010 Alcohol abuse, uncomplicated: Secondary | ICD-10-CM

## 2021-08-27 MED ORDER — KETOROLAC TROMETHAMINE 30 MG/ML IJ SOLN
30.0000 mg | Freq: Once | INTRAMUSCULAR | Status: AC
  Administered 2021-08-27: 30 mg via INTRAVENOUS
  Filled 2021-08-27: qty 1

## 2021-08-27 NOTE — Consult Note (Signed)
[facility] Psych ED Discharge  08/27/2021 12:25 PM [name]  MRN:  [id number]  Method of visit?: Face to Face   Principal Problem: <principal problem not specified> Discharge Diagnoses: Active Problems:   * No active hospital problems. *   Subjective: s patient is a 51 year old male with a history of alcohol induced mood disorder,  Patient is a 51 year old male that presents voluntary this date to [facility] requesting assistance with ongoing alcohol issues. Anxiety, cirrhosis, depression, schizoaffective disorder, seizure disorder related to alcohol use.  Who presents with initial concern regarding alcohol withdrawal, suspects he had a seizure.  Patient has long-term history of alcohol use, severe and dependence.  Who appears to be at his baseline at this time.  Patient is observed to be tremulous this morning on reassessment, however denies any acute withdrawal symptoms.  He has been receiving Ativan and on CIWA protocol during his hospitalization.  When assessing patient for best outpatient practices, he states it has been a long time and he does not know the name of the facility.  Further assessment was sought into his motivation and readiness to change, in which patient denied wanting to go to any detox facility and or short-term dual diagnosis/rehabilitation facility.  Patient is again asked what can we do best for him, in which he states he wants some help for his schizoaffective disorder.  At present patient does not appear to be exhibiting any symptoms associated with schizoaffective disorder.  On evaluation today patient is alert and oriented x4, pleasant, smiling, appears in no acute distress.  He does not appear to be depressed, displaying mania, exhibiting mood lability, anxiety.  Furthermore he does not appear to have any immediate safety concerns, and does not appear to be acutely psychotic at this time.   He does not appear to be internally preoccupied, delusional, paranoid, manic, or otherwise  psychotic. He denies any auditory visual hallucinations, suicidal thoughts, or self harming behaviors.He is psychiatrically cleared for discharge with outpatient.  He is linear and able to answer all questions appropriately.   He denies any thoughts of self-harm, suicidal thoughts and remains future oriented and expressing the need for help.   HPI: Patient is a 51 year old male that presents voluntary this date to [facility] requesting assistance with ongoing alcohol issues. Patient voices passive S/I reporting he "just wants to drink himself to death." Patient denies any H/I or AVH. Patient denies having a current mental health provider although reports he was diagnosed with Schizoaffective disorder in the past. Patient denies currently been on any psychotropic medications and states he self medicates with alcohol. Patient reports daily alcohol use for the last 5 years stating he has "been drinking himself to death" especially in the last year. Patient reports various amounts of alcohol daily stating he "doesn't even keep up with what or how much he drinks anymore." Patient states he usually drinks 12 or more beers a day along with a pint or more of hard liquor." Patient reports last use was prior to arrival stating he drank "a couple pints of liquor." Patient's BAL was 161 on arrival. Patient also reports sporadic THC use although is vague in reference to amounts used and time frame. Patient's UDS was positive for Benzodiazepines and THC. Patient has multiple health use associated with his alcohol use. See patient's chart for additional details.  Total Time spent with patient: 30 minutes  Past Psychiatric History:   Past Medical History:  Past Medical History:  Diagnosis Date  Alcohol abuse    Anxiety    at age 83   Blood transfusion without reported diagnosis    Cirrhosis ([place])    Depression    at age 61   GERD (gastroesophageal reflux disease)    Hypertension    Psychosis ([place])    PTSD  (post-traumatic stress disorder)    Schizoaffective disorder    Seizure disorder ([place])    related to etoh seizure   Symptomatic anemia     Past Surgical History:  Procedure Laterality Date   ESOPHAGOGASTRODUODENOSCOPY (EGD) WITH PROPOFOL N/A 05/05/2020   Procedure: ESOPHAGOGASTRODUODENOSCOPY (EGD) WITH PROPOFOL;  Surgeon: [name], MD;  Location[facility];  Service: Endoscopy;  Laterality: N/A;   FOOT SURGERY     right   HAND SURGERY     Family History:  Family History  Problem Relation Age of Onset   Alcoholism Mother    CAD Father    Depression Brother    Colon polyps Neg Hx    Esophageal cancer Neg Hx    Pancreatic cancer Neg Hx    Stomach cancer Neg Hx    Family Psychiatric  History:  Social History:  Social History   Substance and Sexual Activity  Alcohol Use Yes   Comment: "until I pass out"     Social History   Substance and Sexual Activity  Drug Use Yes   Frequency: 1.0 times per week   Types: Marijuana    Social History   Socioeconomic History   Marital status: Divorced    Spouse name: Not on file   Number of children: Not on file   Years of education: Not on file   Highest education level: Not on file  Occupational History   Not on file  Tobacco Use   Smoking status: Every Day    Packs/day: 1.50    Years: 30.00    Pack years: 45.00    Types: Cigarettes   Smokeless tobacco: Never  Vaping Use   Vaping Use: Never used  Substance and Sexual Activity   Alcohol use: Yes    Comment: "until I pass out"   Drug use: Yes    Frequency: 1.0 times per week    Types: Marijuana   Sexual activity: Yes    Birth control/protection: Condom  Other Topics Concern   Not on file  Social History Narrative   Not on file   Social Determinants of Health   Financial Resource Strain: Not on file  Food Insecurity: Not on file  Transportation Needs: Not on file  Physical Activity: Not on file  Stress: Not on file  Social Connections: Not on file     Tobacco Cessation:  N/A, patient does not currently use tobacco products  Current Medications: Current Facility-Administered Medications  Medication Dose Route Frequency Provider Last Rate Last Admin   LORazepam (ATIVAN) injection 0-4 mg  0-4 mg Intravenous Q6H [name], Logan, PA-C   2 mg at 08/27/21 0021   Or   LORazepam (ATIVAN) tablet 0-4 mg  0-4 mg Oral Q6H [name], Logan, PA-C   1 mg at 08/27/21 1139   [START ON 08/28/2021] LORazepam (ATIVAN) injection 0-4 mg  0-4 mg Intravenous Q12H Rayna Sexton, PA-C       Or   [START ON 08/28/2021] LORazepam (ATIVAN) tablet 0-4 mg  0-4 mg Oral Q12H [name], Logan, PA-C       thiamine tablet 100 mg  100 mg Oral Daily [name], Logan, PA-C   100 mg at 08/27/21 [phone]  Or   thiamine (B-1) injection 100 mg  100 mg Intravenous Daily Rayna Sexton, PA-C       Current Outpatient Medications  Medication Sig Dispense Refill   gabapentin (NEURONTIN) 300 MG capsule Take 1 capsule (300 mg total) by mouth 3 (three) times daily. 90 capsule 3   lactulose (CHRONULAC) 10 GM/15ML solution Take 45 mLs (30 g total) by mouth 3 (three) times daily. 3784 mL 3   MILK THISTLE PO Take 1 capsule by mouth 3 (three) times daily.     pantoprazole (PROTONIX) 40 MG tablet Take 1 tablet (40 mg total) by mouth 2 (two) times daily. 180 tablet 1   QUEtiapine (SEROQUEL) 200 MG tablet Take 1 tablet (200 mg total) by mouth at bedtime. 30 tablet 1   sertraline (ZOLOFT) 25 MG tablet Take 1 tablet (25 mg total) by mouth daily. 90 tablet 0   spironolactone (ALDACTONE) 100 MG tablet Take 1 tablet (100 mg total) by mouth daily. 90 tablet 1   vitamin C (VITAMIN C) 250 MG tablet Take 1 tablet (250 mg total) by mouth 2 (two) times daily.     chlordiazePOXIDE (LIBRIUM) 25 MG capsule 50mg  PO TID x 1D, then 25-50mg  PO BID X 1D, then 25-50mg  PO QD X 1D (Patient not taking: Reported on 08/26/2021) 10 capsule 0   Multiple Vitamin (MULTIVITAMIN WITH MINERALS) TABS tablet Take 1 tablet by  mouth daily. (Patient not taking: Reported on 08/26/2021)     PTA Medications: (Not in a hospital admission)   Musculoskeletal: Strength & Muscle Tone: within normal limits Gait & Station: normal Patient leans: N/A  Psychiatric Specialty Exam:  Presentation  General Appearance: Appropriate for Environment; Casual  Eye Contact:Fair  Speech:Clear and Coherent; Normal Rate  Speech Volume:Normal  Handedness:Right   Mood and Affect  Mood:Anxious  Affect:Appropriate; Congruent   Thought Process  Thought Processes:Coherent; Linear; Goal Directed  Descriptions of Associations:Intact  Orientation:Full (Time, Place and Person)  Thought Content:Logical  History of Schizophrenia/Schizoaffective disorder:No  Duration of Psychotic Symptoms:No data recorded Hallucinations:Hallucinations: None  Ideas of Reference:None  Suicidal Thoughts:Suicidal Thoughts: No  Homicidal Thoughts:Homicidal Thoughts: No   Sensorium  Memory:Immediate Good; Recent Good; Remote Good  Judgment:Fair  Insight:Fair   Executive Functions  Concentration:Fair  Attention Span:Fair  [place]   Psychomotor Activity  Psychomotor Activity:Psychomotor Activity: Normal   Assets  Assets:Communication Skills; Desire for Improvement; Physical Health; Resilience; Social Support   Sleep  Sleep:Sleep: Fair    Physical Exam: Physical Exam ROS Blood pressure (!) 152/94, pulse 97, temperature 97.9 F (36.6 C), temperature source Oral, resp. rate 18, height 6' (1.829 m), weight 88.5 kg, SpO2 98 %. Body mass index is 26.45 kg/m.   Demographic Factors:  Male, Caucasian, Low socioeconomic status, and Unemployed  Loss Factors: Decrease in vocational status, Decline in physical health, and Financial problems/change in socioeconomic status  Historical Factors: NA  Risk Reduction Factors:   Sense of responsibility to family, Positive  therapeutic relationship, and Positive coping skills or problem solving skills  Continued Clinical Symptoms:  Alcohol/Substance Abuse/Dependencies Schizophrenia:   Depressive state More than one psychiatric diagnosis Previous Psychiatric Diagnoses and Treatments Medical Diagnoses and Treatments/Surgeries  Cognitive Features That Contribute To Risk:  None    Suicide Risk:  Minimal: No identifiable suicidal ideation.  Patients presenting with no risk factors but with morbid ruminations; may be classified as minimal risk based on the severity of the depressive symptoms    Plan Of Care/Follow-up recommendations:  Other:    Will provide with resources for outpatient substance abuse facilities.  Please refer to AVS in discharge instructions.  Patient will benefit from day marked residential, once he is ready to change.  As noted above patient currently denies motivation to quit at this time, would not benefit from inpatient psychiatric admission.  Patient furthermore does not appear to be exhibiting any acute symptoms related to his alcohol use.  We have a pretty low suspicion that he is acutely withdrawal.  Blood alcohol level on admission was 161, it has been more than 30 hours since his admission to the emergency room.  Patient may benefit from Librium prescription upon discharge, however patient recently was prescribed Librium in December after a brief emergency room visit in which she continued to drink and present intoxicated.  At this point patient appears to be psychiatrically stable for discharge home.  Disposition: Psych cleared.  Suella Broad, FNP 08/27/2021, 12:25 PM

## 2021-08-27 NOTE — ED Provider Notes (Addendum)
Emergency Medicine Observation Re-evaluation Note  [name] is a 51 y.o. male, seen on rounds today.  Pt initially presented to the ED for complaints of Suicidal and Alcohol Problem Currently, the patient is awake and alert.  He was put on the CIWA protocol yesterday and feels a little shaky, but shows no obvious signs of withdrawal.  He is due for ativan soon.  Physical Exam  BP (!) 151/97    Pulse 96    Temp 97.9 F (36.6 C) (Oral)    Resp 18    Ht 6' (1.829 m)    Wt 88.5 kg    SpO2 98%    BMI 26.45 kg/m  Physical Exam General: awake and alert Cardiac: rrr Lungs: cta b Psych: calm, but feels shaky.  ED Course / MDM  EKG:EKG Interpretation  Date/Time:  Monday August 26 2021 05:14:22 EST Ventricular Rate:  69 PR Interval:  152 QRS Duration: 88 QT Interval:  400 QTC Calculation: 429 R Axis:   69 Text Interpretation: Sinus rhythm Confirmed by Regan Lemming ([id number]) on 08/26/2021 5:45:48 AM  I have reviewed the labs performed to date as well as medications administered while in observation.  Recent changes in the last 24 hours include [facility] did see pt yesterday afternoon.  Plan  Current plan is for re-evaluation today.  [name] is not under involuntary commitment.  Pt has been re-assessed by behavioral health.  He does not meet inpatient admission criteria.  He is stable for d/c.     Ryan Pence, MD 08/27/21 3005    Ryan Pence, MD 08/27/21 1310

## 2021-08-27 NOTE — BH Assessment (Signed)
[facility] Assessment Progress Note   Per Sheran Fava, NP, this voluntary pt does not require psychiatric hospitalization at this time.  Pt is psychiatrically cleared.  Discharge instructions include referral information for area providers of treatment for patients with substance use disorders.  EDP Isla Pence, MD and pt's nurse, Lisette Grinder, have been notified.  Jalene Mullet, [place] Triage Specialist [phone]

## 2021-08-27 NOTE — Discharge Instructions (Addendum)
To help you maintain a sober lifestyle, a substance use disorder treatment program may be beneficial to you.  Contact one of the following providers at your earliest opportunity to ask about enrolling their program:  [facility]       [facility]      [address]      [place], [place] [address]      [phone]        [facility]       [place], [place] [address]      [phone]        [facility]       [address] [place], [place] [address]       [phone]        [facility]       [address] [place], [place] [address]       [phone]       [facility]      [place], [place] [address]      [phone]       [facility]      [place]      [place], [place] [address]      [phone] [place] [address]      [phone]       [facility]      [address]      [place], [place] [address]      [phone]  OUTPATIENT PROGRAMS:       [facility]. [employer] [address], [place] [address]      [phone]      [facility] Outpatient Program (afternoon program); individual therapy       [facility]      [address]      [place], [place] [address]      [phone]      [facility] (morning & evening programs); individual therapy

## 2021-08-27 NOTE — ED Notes (Signed)
Patient up requesting phone. Phone given to call mom. Alert and pleasant at this time.

## 2021-09-07 ENCOUNTER — Inpatient Hospital Stay (HOSPITAL_COMMUNITY): Payer: Medicare Other

## 2021-09-07 ENCOUNTER — Emergency Department (HOSPITAL_COMMUNITY): Payer: Medicare Other

## 2021-09-07 ENCOUNTER — Inpatient Hospital Stay (HOSPITAL_COMMUNITY)
Admission: EM | Admit: 2021-09-07 | Discharge: 2021-09-23 | DRG: 897 | Disposition: A | Discharge status: Skilled Nursing Facility | Payer: Medicare Other | Attending: Family Medicine | Admitting: Family Medicine

## 2021-09-07 ENCOUNTER — Encounter (HOSPITAL_COMMUNITY): Payer: Self-pay

## 2021-09-07 DIAGNOSIS — I10 Essential (primary) hypertension: Secondary | ICD-10-CM | POA: Diagnosis present

## 2021-09-07 DIAGNOSIS — K219 Gastro-esophageal reflux disease without esophagitis: Secondary | ICD-10-CM | POA: Diagnosis present

## 2021-09-07 DIAGNOSIS — F10231 Alcohol dependence with withdrawal delirium: Secondary | ICD-10-CM | POA: Diagnosis not present

## 2021-09-07 DIAGNOSIS — D539 Nutritional anemia, unspecified: Secondary | ICD-10-CM | POA: Diagnosis not present

## 2021-09-07 DIAGNOSIS — F10939 Alcohol use, unspecified with withdrawal, unspecified: Secondary | ICD-10-CM

## 2021-09-07 DIAGNOSIS — F10930 Alcohol use, unspecified with withdrawal, uncomplicated: Secondary | ICD-10-CM

## 2021-09-07 DIAGNOSIS — L89159 Pressure ulcer of sacral region, unspecified stage: Secondary | ICD-10-CM | POA: Diagnosis present

## 2021-09-07 DIAGNOSIS — K7030 Alcoholic cirrhosis of liver without ascites: Secondary | ICD-10-CM | POA: Diagnosis present

## 2021-09-07 DIAGNOSIS — G40909 Epilepsy, unspecified, not intractable, without status epilepticus: Secondary | ICD-10-CM | POA: Diagnosis present

## 2021-09-07 DIAGNOSIS — F17210 Nicotine dependence, cigarettes, uncomplicated: Secondary | ICD-10-CM | POA: Diagnosis present

## 2021-09-07 DIAGNOSIS — M109 Gout, unspecified: Secondary | ICD-10-CM | POA: Diagnosis not present

## 2021-09-07 DIAGNOSIS — D696 Thrombocytopenia, unspecified: Secondary | ICD-10-CM | POA: Diagnosis not present

## 2021-09-07 DIAGNOSIS — R739 Hyperglycemia, unspecified: Secondary | ICD-10-CM | POA: Diagnosis not present

## 2021-09-07 DIAGNOSIS — F4310 Post-traumatic stress disorder, unspecified: Secondary | ICD-10-CM | POA: Diagnosis present

## 2021-09-07 DIAGNOSIS — D75839 Thrombocytosis, unspecified: Secondary | ICD-10-CM | POA: Diagnosis not present

## 2021-09-07 DIAGNOSIS — R7989 Other specified abnormal findings of blood chemistry: Secondary | ICD-10-CM | POA: Diagnosis not present

## 2021-09-07 DIAGNOSIS — F251 Schizoaffective disorder, depressive type: Secondary | ICD-10-CM | POA: Diagnosis present

## 2021-09-07 DIAGNOSIS — Z8249 Family history of ischemic heart disease and other diseases of the circulatory system: Secondary | ICD-10-CM

## 2021-09-07 DIAGNOSIS — K760 Fatty (change of) liver, not elsewhere classified: Secondary | ICD-10-CM | POA: Diagnosis present

## 2021-09-07 DIAGNOSIS — E876 Hypokalemia: Secondary | ICD-10-CM | POA: Diagnosis present

## 2021-09-07 DIAGNOSIS — Z888 Allergy status to other drugs, medicaments and biological substances status: Secondary | ICD-10-CM | POA: Diagnosis not present

## 2021-09-07 DIAGNOSIS — Y900 Blood alcohol level of less than 20 mg/100 ml: Secondary | ICD-10-CM | POA: Diagnosis present

## 2021-09-07 DIAGNOSIS — R569 Unspecified convulsions: Principal | ICD-10-CM

## 2021-09-07 DIAGNOSIS — E871 Hypo-osmolality and hyponatremia: Secondary | ICD-10-CM | POA: Diagnosis not present

## 2021-09-07 DIAGNOSIS — Z6372 Alcoholism and drug addiction in family: Secondary | ICD-10-CM

## 2021-09-07 DIAGNOSIS — Z20822 Contact with and (suspected) exposure to covid-19: Secondary | ICD-10-CM | POA: Diagnosis present

## 2021-09-07 DIAGNOSIS — Z818 Family history of other mental and behavioral disorders: Secondary | ICD-10-CM | POA: Diagnosis not present

## 2021-09-07 DIAGNOSIS — F10931 Alcohol use, unspecified with withdrawal delirium: Secondary | ICD-10-CM

## 2021-09-07 DIAGNOSIS — Z811 Family history of alcohol abuse and dependence: Secondary | ICD-10-CM | POA: Diagnosis not present

## 2021-09-07 DIAGNOSIS — W19XXXA Unspecified fall, initial encounter: Secondary | ICD-10-CM

## 2021-09-07 LAB — COMPREHENSIVE METABOLIC PANEL
ALT: 69 U/L — ABNORMAL HIGH (ref 0–44)
AST: 155 U/L — ABNORMAL HIGH (ref 15–41)
Albumin: 4.7 g/dL (ref 3.5–5.0)
Alkaline Phosphatase: 197 U/L — ABNORMAL HIGH (ref 38–126)
Anion gap: 20 — ABNORMAL HIGH (ref 5–15)
BUN: 7 mg/dL (ref 6–20)
CO2: 24 mmol/L (ref 22–32)
Calcium: 9 mg/dL (ref 8.9–10.3)
Chloride: 90 mmol/L — ABNORMAL LOW (ref 98–111)
Creatinine, Ser: 0.99 mg/dL (ref 0.61–1.24)
GFR, Estimated: >60 mL/min (ref >60)
Glucose, Bld: 172 mg/dL — ABNORMAL HIGH (ref 70–99)
Potassium: 2.5 mmol/L — CL (ref 3.5–5.1)
Sodium: 134 mmol/L — ABNORMAL LOW (ref 135–145)
Total Bilirubin: 1.7 mg/dL — ABNORMAL HIGH (ref 0.3–1.2)
Total Protein: 8.1 g/dL (ref 6.5–8.1)

## 2021-09-07 LAB — MAGNESIUM
Magnesium: 1.4 mg/dL — ABNORMAL LOW (ref 1.7–2.4)
Magnesium: 2.2 mg/dL (ref 1.7–2.4)

## 2021-09-07 LAB — RENAL FUNCTION PANEL
Albumin: 4.1 g/dL (ref 3.5–5.0)
Anion gap: 8 (ref 5–15)
BUN: 8 mg/dL (ref 6–20)
CO2: 30 mmol/L (ref 22–32)
Calcium: 8.6 mg/dL — ABNORMAL LOW (ref 8.9–10.3)
Chloride: 97 mmol/L — ABNORMAL LOW (ref 98–111)
Creatinine, Ser: 0.98 mg/dL (ref 0.61–1.24)
GFR, Estimated: >60 mL/min (ref >60)
Glucose, Bld: 120 mg/dL — ABNORMAL HIGH (ref 70–99)
Phosphorus: 2.2 mg/dL — ABNORMAL LOW (ref 2.5–4.6)
Potassium: 3 mmol/L — ABNORMAL LOW (ref 3.5–5.1)
Sodium: 135 mmol/L (ref 135–145)

## 2021-09-07 LAB — CBC
HCT: 43.4 % (ref 39.0–52.0)
Hemoglobin: 14.9 g/dL (ref 13.0–17.0)
MCH: 33.1 pg (ref 26.0–34.0)
MCHC: 34.3 g/dL (ref 30.0–36.0)
MCV: 96.4 fL (ref 80.0–100.0)
Platelets: 195 10*3/uL (ref 150–400)
RBC: 4.5 MIL/uL (ref 4.22–5.81)
RDW: 15.7 % — ABNORMAL HIGH (ref 11.5–15.5)
WBC: 8.3 10*3/uL (ref 4.0–10.5)
nRBC: 0 % (ref 0.0–0.2)

## 2021-09-07 LAB — RESP PANEL BY RT-PCR (FLU A&B, COVID) ARPGX2
Influenza A by PCR: NEGATIVE
Influenza B by PCR: NEGATIVE
SARS Coronavirus 2 by RT PCR: NEGATIVE

## 2021-09-07 LAB — HIV ANTIBODY (ROUTINE TESTING W REFLEX): HIV Screen 4th Generation wRfx: NONREACTIVE

## 2021-09-07 LAB — PHOSPHORUS: Phosphorus: 2.1 mg/dL — ABNORMAL LOW (ref 2.5–4.6)

## 2021-09-07 LAB — ETHANOL: Alcohol, Ethyl (B): 10 mg/dL — ABNORMAL HIGH (ref <10)

## 2021-09-07 LAB — MRSA NEXT GEN BY PCR, NASAL: MRSA by PCR Next Gen: NOT DETECTED

## 2021-09-07 MED ORDER — MAGNESIUM SULFATE 2 GM/50ML IV SOLN
2.0000 g | Freq: Once | INTRAVENOUS | Status: AC
  Administered 2021-09-07: 2 g via INTRAVENOUS
  Filled 2021-09-07: qty 50

## 2021-09-07 MED ORDER — SODIUM CHLORIDE 0.9 % IV SOLN
2000.0000 mg | Freq: Once | INTRAVENOUS | Status: AC
  Administered 2021-09-07: 2000 mg via INTRAVENOUS
  Filled 2021-09-07: qty 20

## 2021-09-07 MED ORDER — THIAMINE HCL 100 MG PO TABS: 100.0000 mg | ORAL_TABLET | Freq: Every day | ORAL | Status: DC

## 2021-09-07 MED ORDER — CHLORHEXIDINE GLUCONATE 0.12% ORAL RINSE (MEDLINE KIT): 15.0000 mL | Freq: Two times a day (BID) | OROMUCOSAL | Status: DC

## 2021-09-07 MED ORDER — SODIUM CHLORIDE 0.9 % IV SOLN: INTRAVENOUS | Status: DC | PRN

## 2021-09-07 MED ORDER — LORAZEPAM 2 MG/ML IJ SOLN
2.0000 mg | Freq: Once | INTRAMUSCULAR | Status: AC
  Administered 2021-09-07: 2 mg via INTRAVENOUS
  Filled 2021-09-07: qty 1

## 2021-09-07 MED ORDER — FOLIC ACID 1 MG PO TABS
1.0000 mg | ORAL_TABLET | Freq: Every day | ORAL | Status: DC
  Administered 2021-09-07 – 2021-09-23 (×17): 1 mg via ORAL
  Filled 2021-09-07 (×17): qty 1

## 2021-09-07 MED ORDER — LORAZEPAM 1 MG PO TABS: 0.0000 mg | ORAL_TABLET | Freq: Four times a day (QID) | ORAL | Status: DC

## 2021-09-07 MED ORDER — LORAZEPAM 2 MG/ML IJ SOLN
0.0000 mg | INTRAMUSCULAR | Status: DC
  Administered 2021-09-07 – 2021-09-08 (×4): 2 mg via INTRAVENOUS
  Administered 2021-09-08 (×2): 3 mg via INTRAVENOUS
  Administered 2021-09-08 (×2): 2 mg via INTRAVENOUS
  Administered 2021-09-09: 4 mg via INTRAVENOUS
  Administered 2021-09-09 (×2): 2 mg via INTRAVENOUS
  Filled 2021-09-07 (×5): qty 1
  Filled 2021-09-07: qty 2
  Filled 2021-09-07: qty 1
  Filled 2021-09-07: qty 2
  Filled 2021-09-07 (×2): qty 1
  Filled 2021-09-07: qty 2

## 2021-09-07 MED ORDER — PROCHLORPERAZINE EDISYLATE 10 MG/2ML IJ SOLN: 10.0000 mg | Freq: Four times a day (QID) | INTRAMUSCULAR | Status: DC | PRN

## 2021-09-07 MED ORDER — POTASSIUM CHLORIDE CRYS ER 20 MEQ PO TBCR
40.0000 meq | EXTENDED_RELEASE_TABLET | Freq: Once | ORAL | Status: AC
  Administered 2021-09-07: 40 meq via ORAL
  Filled 2021-09-07: qty 2

## 2021-09-07 MED ORDER — POTASSIUM CHLORIDE 10 MEQ/100ML IV SOLN
10.0000 meq | INTRAVENOUS | Status: AC
  Administered 2021-09-07 (×3): 10 meq via INTRAVENOUS
  Filled 2021-09-07 (×3): qty 100

## 2021-09-07 MED ORDER — LEVETIRACETAM IN NACL 500 MG/100ML IV SOLN
500.0000 mg | Freq: Two times a day (BID) | INTRAVENOUS | Status: DC
  Administered 2021-09-07 – 2021-09-10 (×7): 500 mg via INTRAVENOUS
  Filled 2021-09-07 (×8): qty 100

## 2021-09-07 MED ORDER — LORAZEPAM 2 MG/ML IJ SOLN
0.0000 mg | Freq: Four times a day (QID) | INTRAMUSCULAR | Status: DC
  Administered 2021-09-07: 1 mg via INTRAVENOUS
  Filled 2021-09-07: qty 1

## 2021-09-07 MED ORDER — LORAZEPAM 1 MG PO TABS
1.0000 mg | ORAL_TABLET | ORAL | Status: DC | PRN
  Administered 2021-09-07 – 2021-09-10 (×2): 2 mg via ORAL
  Filled 2021-09-07 (×2): qty 2

## 2021-09-07 MED ORDER — LORAZEPAM 2 MG/ML IJ SOLN
INTRAMUSCULAR | Status: AC
  Administered 2021-09-07: 2 mg via INTRAVENOUS
  Filled 2021-09-07: qty 2

## 2021-09-07 MED ORDER — METOPROLOL TARTRATE 5 MG/5ML IV SOLN: 5.0000 mg | Freq: Four times a day (QID) | INTRAVENOUS | Status: DC | PRN

## 2021-09-07 MED ORDER — SODIUM PHOSPHATES 45 MMOLE/15ML IV SOLN
30.0000 mmol | Freq: Once | INTRAVENOUS | Status: AC
  Administered 2021-09-07: 30 mmol via INTRAVENOUS
  Filled 2021-09-07 (×2): qty 10

## 2021-09-07 MED ORDER — THIAMINE HCL 100 MG PO TABS
100.0000 mg | ORAL_TABLET | Freq: Every day | ORAL | Status: DC
  Administered 2021-09-08 – 2021-09-10 (×3): 100 mg via ORAL
  Filled 2021-09-07 (×3): qty 1

## 2021-09-07 MED ORDER — THIAMINE HCL 100 MG/ML IJ SOLN
100.0000 mg | Freq: Every day | INTRAMUSCULAR | Status: DC
  Filled 2021-09-07: qty 2

## 2021-09-07 MED ORDER — LORAZEPAM 2 MG/ML IJ SOLN: 0.0000 mg | Freq: Two times a day (BID) | INTRAMUSCULAR | Status: DC

## 2021-09-07 MED ORDER — LORAZEPAM 2 MG/ML IJ SOLN
1.0000 mg | INTRAMUSCULAR | Status: DC | PRN
  Administered 2021-09-09: 2 mg via INTRAVENOUS
  Administered 2021-09-09 (×2): 1 mg via INTRAVENOUS
  Administered 2021-09-09 (×3): 2 mg via INTRAVENOUS
  Administered 2021-09-09: 3 mg via INTRAVENOUS
  Filled 2021-09-07: qty 1
  Filled 2021-09-07: qty 2
  Filled 2021-09-07 (×6): qty 1

## 2021-09-07 MED ORDER — ORAL CARE MOUTH RINSE: 15.0000 mL | OROMUCOSAL | Status: DC

## 2021-09-07 MED ORDER — THIAMINE HCL 100 MG/ML IJ SOLN
100.0000 mg | Freq: Every day | INTRAMUSCULAR | Status: DC
  Administered 2021-09-07: 100 mg via INTRAVENOUS
  Filled 2021-09-07: qty 2

## 2021-09-07 MED ORDER — LORAZEPAM 2 MG/ML IJ SOLN
0.0000 mg | Freq: Three times a day (TID) | INTRAMUSCULAR | Status: DC
  Administered 2021-09-09: 2 mg via INTRAVENOUS

## 2021-09-07 MED ORDER — SODIUM CHLORIDE 0.9 % IV SOLN: INTRAVENOUS | Status: DC

## 2021-09-07 MED ORDER — LORAZEPAM 1 MG PO TABS: 0.0000 mg | ORAL_TABLET | Freq: Two times a day (BID) | ORAL | Status: DC

## 2021-09-07 MED ORDER — ADULT MULTIVITAMIN W/MINERALS CH
1.0000 | ORAL_TABLET | Freq: Every day | ORAL | Status: DC
  Administered 2021-09-07 – 2021-09-10 (×4): 1 via ORAL
  Filled 2021-09-07 (×4): qty 1

## 2021-09-07 MED ORDER — CHLORHEXIDINE GLUCONATE CLOTH 2 % EX PADS
6.0000 | MEDICATED_PAD | Freq: Every day | CUTANEOUS | Status: DC
  Administered 2021-09-07 – 2021-09-15 (×9): 6 via TOPICAL

## 2021-09-07 MED ORDER — ORAL CARE MOUTH RINSE
15.0000 mL | Freq: Two times a day (BID) | OROMUCOSAL | Status: DC
  Administered 2021-09-07 – 2021-09-23 (×31): 15 mL via OROMUCOSAL

## 2021-09-07 NOTE — ED Triage Notes (Addendum)
Patient BIB [facility] from home. Pt states he has been in alcohol withdrawal today, last drink was early yesterday. Pt endorses tremors, vomiting, and diarrhea. Hx of withdrawal seizures.   EMS vitals: BP 172 palpated HR 120 CBG 313

## 2021-09-07 NOTE — Plan of Care (Signed)
°  Problem: Self-Concept: Goal: Level of anxiety will decrease Outcome: Progressing

## 2021-09-07 NOTE — ED Notes (Signed)
Patient transported to CT 

## 2021-09-07 NOTE — H&P (Signed)
History and Physical    Nash Bolls EHM:[id number] DOB: Apr 29, 1971 DOA: 09/07/2021  PCP: [name], MD  Patient coming from: Home  Chief Complaint: "I'm withdrawing"  HPI: [name] is a 51 y.o. male with medical history significant of EtOH abuse, alcoholic cirrhosis, schizoaffective disorder. Presenting with withdrawal symptoms. He states his last drink was yesterday. He typically drinks 4 - 5 "Four Loko" daily. Yesterday, he felt shaky and anxious. He thinks he may have had a seizure, but he is unsure. He felt as if he body was being electrocuted. He found this disconcerting, so he called for EMS to bring him to the ED. He denies any other aggravating or alleviating factors.   ED Course: K+ was 2.5. Mg2+ was 1.4. Phos was 2.1. EtOH level was 10. He was noted to have a seizure in holding. He was given ativan and was able to recover. [facility] was called for admission.    Review of Systems:  Denies CP, palpitations, abdominal pain, dyspnea, fever. Reports N/V/D. Review of systems is otherwise negative for all not mentioned in HPI.   PMHx Past Medical History:  Diagnosis Date   Alcohol abuse    Anxiety    at age 88   Blood transfusion without reported diagnosis    Cirrhosis ([place])    Depression    at age 85   GERD (gastroesophageal reflux disease)    Hypertension    Psychosis ([place])    PTSD (post-traumatic stress disorder)    Schizoaffective disorder    Seizure disorder ([place])    related to etoh seizure   Symptomatic anemia     PSHx Past Surgical History:  Procedure Laterality Date   ESOPHAGOGASTRODUODENOSCOPY (EGD) WITH PROPOFOL N/A 05/05/2020   Procedure: ESOPHAGOGASTRODUODENOSCOPY (EGD) WITH PROPOFOL;  Surgeon: [name], MD;  Location: [facility];  Service: Endoscopy;  Laterality: N/A;   FOOT SURGERY     right   HAND SURGERY      SocHx  reports that he has been smoking cigarettes. He has a 45.00 pack-year smoking history. He has never used smokeless  tobacco. He reports current alcohol use. He reports current drug use. Frequency: 1.00 time per week. Drug: Marijuana.  Allergies  Allergen Reactions   Bupropion Other (See Comments)    Caused seizures Caused seizures Feel as though I will have a seizure when taking this med    FamHx Family History  Problem Relation Age of Onset   Alcoholism Mother    CAD Father    Depression Brother    Colon polyps Neg Hx    Esophageal cancer Neg Hx    Pancreatic cancer Neg Hx    Stomach cancer Neg Hx     Prior to Admission medications   Medication Sig Start Date End Date Taking? Authorizing Provider  chlordiazePOXIDE (LIBRIUM) 25 MG capsule 50mg  PO TID x 1D, then 25-50mg  PO BID X 1D, then 25-50mg  PO QD X 1D Patient not taking: Reported on 08/26/2021 07/28/21   Sherwood Gambler, MD  gabapentin (NEURONTIN) 300 MG capsule Take 1 capsule (300 mg total) by mouth 3 (three) times daily. 05/15/21   [name], MD  lactulose (CHRONULAC) 10 GM/15ML solution Take 45 mLs (30 g total) by mouth 3 (three) times daily. 05/15/21   [name], MD  MILK THISTLE PO Take 1 capsule by mouth 3 (three) times daily.    [provider]  Multiple Vitamin (MULTIVITAMIN WITH MINERALS) TABS tablet Take 1 tablet by mouth daily. Patient not taking: Reported on  08/26/2021 05/08/20   [place] Donnamarie Poag, DO  pantoprazole (PROTONIX) 40 MG tablet Take 1 tablet (40 mg total) by mouth 2 (two) times daily. 05/15/21   [name], MD  QUEtiapine (SEROQUEL) 200 MG tablet Take 1 tablet (200 mg total) by mouth at bedtime. 05/15/21   [name], MD  sertraline (ZOLOFT) 25 MG tablet Take 1 tablet (25 mg total) by mouth daily. 05/15/21   [name], MD  spironolactone (ALDACTONE) 100 MG tablet Take 1 tablet (100 mg total) by mouth daily. 05/15/21   [name], MD  vitamin C (VITAMIN C) 250 MG tablet Take 1 tablet (250 mg total) by mouth 2 (two) times daily. 08/06/20   [name], MD    Physical Exam: Vitals:    09/07/21 0930 09/07/21 1000 09/07/21 1030 09/07/21 1100  BP: (!) 134/121 (!) 137/103 (!) 156/100 122/86  Pulse: 100 (!) 103 73 (!) 102  Resp: (!) 21 (!) 22 19 (!) 25  Temp:      SpO2: 94% 93% 96% 92%  Weight:      Height:        General: 51 y.o. disheveled appearing male resting in bed in NAD Eyes: PERRL, normal sclera ENMT: Nares patent w/o discharge, orophaynx clear, dentition normal, ears w/o discharge/lesions/ulcers Neck: Supple, trachea midline Cardiovascular: tachy, +S1, S2, no m/g/r, equal pulses throughout Respiratory: CTABL, no w/r/r, normal WOB GI: BS+, NDNT, no masses noted, no organomegaly noted MSK: No e/c/c Neuro: A&O x 3, no focal deficits Psyc: Appropriate interaction and affect, calm/cooperative  Labs on Admission: I have personally reviewed following labs and imaging studies  CBC: Recent Labs  Lab 09/07/21 0557  WBC 8.3  HGB 14.9  HCT 43.4  MCV 96.4  PLT 353   Basic Metabolic Panel: Recent Labs  Lab 09/07/21 0557  NA 134*  K 2.5*  CL 90*  CO2 24  GLUCOSE 172*  BUN 7  CREATININE 0.99  CALCIUM 9.0  MG 1.4*  PHOS 2.1*   GFR: Estimated Creatinine Clearance: 100.2 mL/min (by C-G formula based on SCr of 0.99 mg/dL). Liver Function Tests: Recent Labs  Lab 09/07/21 0557  AST 155*  ALT 69*  ALKPHOS 197*  BILITOT 1.7*  PROT 8.1  ALBUMIN 4.7   No results for input(s): LIPASE, AMYLASE in the last 168 hours. No results for input(s): AMMONIA in the last 168 hours. Coagulation Profile: No results for input(s): INR, PROTIME in the last 168 hours. Cardiac Enzymes: No results for input(s): CKTOTAL, CKMB, CKMBINDEX, TROPONINI in the last 168 hours. BNP (last 3 results) No results for input(s): PROBNP in the last 8760 hours. HbA1C: No results for input(s): HGBA1C in the last 72 hours. CBG: No results for input(s): GLUCAP in the last 168 hours. Lipid Profile: No results for input(s): CHOL, HDL, LDLCALC, TRIG, CHOLHDL, LDLDIRECT in the last 72  hours. Thyroid Function Tests: No results for input(s): TSH, T4TOTAL, FREET4, T3FREE, THYROIDAB in the last 72 hours. Anemia Panel: No results for input(s): VITAMINB12, FOLATE, FERRITIN, TIBC, IRON, RETICCTPCT in the last 72 hours. Urine analysis:    Component Value Date/Time   COLORURINE YELLOW 08/26/2021 0828   APPEARANCEUR CLEAR 08/26/2021 0828   LABSPEC 1.039 (H) 08/26/2021 0828   PHURINE 7.0 08/26/2021 0828   GLUCOSEU NEGATIVE 08/26/2021 0828   HGBUR NEGATIVE 08/26/2021 0828   BILIRUBINUR NEGATIVE 08/26/2021 0828   KETONESUR NEGATIVE 08/26/2021 0828   PROTEINUR 100 (A) 08/26/2021 0828   UROBILINOGEN 1.0 12/24/2014 0643   NITRITE NEGATIVE 08/26/2021 6144  LEUKOCYTESUR NEGATIVE 08/26/2021 3151    Radiological Exams on Admission: CT Head Wo Contrast  Result Date: 09/07/2021 CLINICAL DATA:  Seizure. EXAM: CT HEAD WITHOUT CONTRAST TECHNIQUE: Contiguous axial images were obtained from the base of the skull through the vertex without intravenous contrast. RADIATION DOSE REDUCTION: This exam was performed according to the departmental dose-optimization program which includes automated exposure control, adjustment of the mA and/or kV according to patient size and/or use of iterative reconstruction technique. COMPARISON:  08/26/2021 FINDINGS: Brain: No evidence of acute infarction, hemorrhage, hydrocephalus, extra-axial collection or mass lesion/mass effect. Vascular: No hyperdense vessel or unexpected calcification. Skull: Normal. Negative for fracture or focal lesion. Sinuses/Orbits: No acute finding. Retention cyst or polyp noted in the right maxillary sinus. Other: None. IMPRESSION: 1. No acute intracranial abnormalities. 2. Right maxillary sinus retention cyst or polyp. Electronically Signed   By: Kerby Moors M.D.   On: 09/07/2021 09:20    EKG: Independently reviewed. Sinus tach, no st elevations  Assessment/Plan EtOH abuse EtOH withdrawal EtOH withdrawal seizures     - admit to  inpt, SDU     - CIWA protocol     - load w/ keppra 2g, continue keppra 500mg  BID     - thiamine, MVI, folate     - fluids     - PCCM has reviewed and feels he's appropriate for SDU     - TOC for substance abuse  Hypokalemia Hypomagnesemia Hypophosphatemia Hyponatremia     - replace K+, Mg2+, PO4; recheck labs at 1400hrs     - getting IVF, follow Na+  Elevated LFTs Hyperbilirubinemia     - check hepatitis panel, RUQ ab [place]  Hyperglycemia     - check A1c  Schizoaffective disorder     - continue home regimen  DVT prophylaxis: SCDs Code Status: FULL  Family Communication: None at bedside  Consults called: [facility]bar[facility] neurology (Dr. Lorrin Goodell)   Status is: Inpatient  Remains inpatient appropriate because: severity of illness  Time spent coordinating admission: 90 minutes  [place] DO [facility]  If 7PM-7AM, please contact night-coverage [email or web address]  09/07/2021, 11:35 AM

## 2021-09-07 NOTE — ED Provider Notes (Signed)
[facility] DEPT Provider Note   CSN: [id number] Arrival date & time: 09/07/21  0534     History Chief Complaint  Patient presents with   Withdrawal    [name] is a 51 y.o. male with history of cirrhosis, schizoaffective disorder, depression, and hypertension presents to the ED for evaluation of EtOH withdrawal.  The patient reports alcohol use disorder with drinking around five 4 Loco's a day with his last drink being yesterday around 2000.  He reports some abdominal pain, diarrhea, and vomiting.  He reports noncompliance he with his meds and is not taking his lactulose like he is supposed to.  He reports he may have possibly had a seizure last night, but no one was there to tell him.  He reports that his kitchen table was moved and he woke up on the floor.  He reports soreness to his tongue as he thinks he may have bit it.  He denies any dysuria, hematuria, dark or tarry stools.  Denies any head or neck pain.  Denies any hallucinations, fever, chills.  He is allergic to Wellbutrin.  Daily smoker and alcohol user.  Reports marijuana use, but denies any current IV drug use, but IV drug use in the past.  HPI     Home Medications Prior to Admission medications   Medication Sig Start Date End Date Taking? Authorizing Provider  chlordiazePOXIDE (LIBRIUM) 25 MG capsule 100m PO TID x 1D, then 25-535mPO BID X 1D, then 25-5070mO QD X 1D Patient not taking: Reported on 08/26/2021 07/28/21   GolSherwood GamblerD  gabapentin (NEURONTIN) 300 MG capsule Take 1 capsule (300 mg total) by mouth 3 (three) times daily. 05/15/21   [name]  lactulose (CHRONULAC) 10 GM/15ML solution Take 45 mLs (30 g total) by mouth 3 (three) times daily. 05/15/21   [name]  MILK THISTLE PO Take 1 capsule by mouth 3 (three) times daily.    [provider]  Multiple Vitamin (MULTIVITAMIN WITH MINERALS) TABS tablet Take 1 tablet by mouth daily. Patient not  taking: Reported on 08/26/2021 05/08/20   [place]riDonnamarie PoagO  pantoprazole (PROTONIX) 40 MG tablet Take 1 tablet (40 mg total) by mouth 2 (two) times daily. 05/15/21   [name]  QUEtiapine (SEROQUEL) 200 MG tablet Take 1 tablet (200 mg total) by mouth at bedtime. 05/15/21   [name]  sertraline (ZOLOFT) 25 MG tablet Take 1 tablet (25 mg total) by mouth daily. 05/15/21   [name]  spironolactone (ALDACTONE) 100 MG tablet Take 1 tablet (100 mg total) by mouth daily. 05/15/21   [name]  vitamin C (VITAMIN C) 250 MG tablet Take 1 tablet (250 mg total) by mouth 2 (two) times daily. 08/06/20   GirDwyane DeeD      Allergies    Bupropion    Review of Systems   Review of Systems  Constitutional:  Negative for chills and fever.  Gastrointestinal:  Positive for abdominal pain, diarrhea, nausea and vomiting.  Neurological:  Positive for seizures.  Psychiatric/Behavioral:  Negative for hallucinations.    Physical Exam Updated Vital Signs BP (!) 148/99    Pulse 88    Temp 98 F (36.7 C)    Resp (!) 22    SpO2 93%  Physical Exam Vitals and nursing note reviewed.  Constitutional:      General: He is not in acute distress.    Comments: Disheveled  HENT:  Head: Normocephalic and atraumatic.  Eyes:     General: No scleral icterus.    Extraocular Movements: Extraocular movements intact.     Pupils: Pupils are equal, round, and reactive to light.  Cardiovascular:     Rate and Rhythm: Normal rate and regular rhythm.  Pulmonary:     Effort: Pulmonary effort is normal.     Breath sounds: Normal breath sounds.  Abdominal:     General: Bowel sounds are normal. There is distension.     Palpations: Abdomen is soft.     Tenderness: There is abdominal tenderness. There is no guarding or rebound.     Comments: Diffuse tenderness palpation.  No overlying skin changes noted.  Normal active bowel sounds.  Abdomen slightly distended, however soft.  No guarding or  rebound.  Musculoskeletal:        General: No deformity.     Cervical back: Normal range of motion.  Skin:    General: Skin is warm and dry.     Coloration: Skin is not jaundiced.  Neurological:     General: No focal deficit present.     Mental Status: He is alert and oriented to person, place, and time. Mental status is at baseline.     Cranial Nerves: No cranial nerve deficit.     Motor: No weakness.    ED Results / Procedures / Treatments   Labs (all labs ordered are listed, but only abnormal results are displayed) Labs Reviewed  COMPREHENSIVE METABOLIC PANEL - Abnormal; Notable for the following components:      Result Value   Sodium 134 (*)    Potassium 2.5 (*)    Chloride 90 (*)    Glucose, Bld 172 (*)    AST 155 (*)    ALT 69 (*)    Alkaline Phosphatase 197 (*)    Total Bilirubin 1.7 (*)    Anion gap 20 (*)    All other components within normal limits  ETHANOL - Abnormal; Notable for the following components:   Alcohol, Ethyl (B) 10 (*)    All other components within normal limits  CBC - Abnormal; Notable for the following components:   RDW 15.7 (*)    All other components within normal limits  RAPID URINE DRUG SCREEN, HOSP PERFORMED - Abnormal; Notable for the following components:   Benzodiazepines POSITIVE (*)    Tetrahydrocannabinol POSITIVE (*)    All other components within normal limits  MAGNESIUM - Abnormal; Notable for the following components:   Magnesium 1.4 (*)    All other components within normal limits  PHOSPHORUS - Abnormal; Notable for the following components:   Phosphorus 2.1 (*)    All other components within normal limits  RENAL FUNCTION PANEL - Abnormal; Notable for the following components:   Potassium 3.0 (*)    Chloride 97 (*)    Glucose, Bld 120 (*)    Calcium 8.6 (*)    Phosphorus 2.2 (*)    All other components within normal limits  COMPREHENSIVE METABOLIC PANEL - Abnormal; Notable for the following components:   Potassium 2.9  (*)    Glucose, Bld 103 (*)    Calcium 8.3 (*)    Total Protein 6.3 (*)    AST 186 (*)    ALT 63 (*)    Alkaline Phosphatase 160 (*)    Total Bilirubin 2.3 (*)    All other components within normal limits  CBC - Abnormal; Notable for the following components:   RBC 3.59 (*)  Hemoglobin 12.1 (*)    HCT 36.1 (*)    MCV 100.6 (*)    RDW 15.9 (*)    Platelets 148 (*)    All other components within normal limits  GLUCOSE, CAPILLARY - Abnormal; Notable for the following components:   Glucose-Capillary 107 (*)    All other components within normal limits  BASIC METABOLIC PANEL - Abnormal; Notable for the following components:   Potassium 3.4 (*)    Glucose, Bld 110 (*)    Calcium 8.1 (*)    All other components within normal limits  RESP PANEL BY RT-PCR (FLU A&B, COVID) ARPGX2  MRSA NEXT GEN BY PCR, NASAL  URINALYSIS, ROUTINE W REFLEX MICROSCOPIC  MAGNESIUM  HIV ANTIBODY (ROUTINE TESTING W REFLEX)  MAGNESIUM  FOLATE  VITAMIN B12  PROTIME-INR  HEMOGLOBIN A1C  CBC WITH DIFFERENTIAL/PLATELET  COMPREHENSIVE METABOLIC PANEL  MAGNESIUM    EKG None  Radiology No results found.  Procedures Procedures    Medications Ordered in ED Medications  LORazepam (ATIVAN) tablet 0-4 mg (has no administration in time range)  LORazepam (ATIVAN) injection 0-4 mg (2 mg Intravenous Given 09/07/21 0723)    Or  LORazepam (ATIVAN) tablet 0-4 mg ( Oral See Alternative 09/07/21 0723)  thiamine tablet 100 mg (has no administration in time range)    Or  thiamine (B-1) injection 100 mg (has no administration in time range)    ED Course/ Medical Decision Making/ A&P                           Medical Decision Making Amount and/or Complexity of Data Reviewed Labs: ordered. Radiology: ordered.  Risk OTC drugs. Prescription drug management. Decision regarding hospitalization.   51 year old male presents the emergency department for evaluation of EtOH withdrawals.  Differential diagnosis  includes but is not limited to possible Dts versus uncomplicated EtOH withdrawal.  Patient is mildly tachycardic, otherwise vital signs stable.  Patient normotensive, normal temperature, satting well on room air.  Physical exam shows a disheveled patient with some abdominal distention and tenderness diffusely although soft abdomen without guarding or rebound.  RRR.  No focal deficit.  Patient oriented x3.  Denies any hallucinations.  No jaundice noted to the skin.  During my evaluation, the patient actively had a seizure of tonic-clonic variety.  He was given 2 mg of Ativan and the seizure subsided.  He was postictal for less than 3 minutes and then was alert and oriented x4.  He remembers speaking to me previously, but does not remember his seizure.  He does report a history of DTs with seizures in the past.  CIWA protocol initiated.  Patient was given thiamine.  Weakness of the seizure, CT of head ordered along with basic labs.  I independently interpreted and reviewed the patient's labs and imaging.  CBC shows no leukocytosis or anemia.  His CMP shows significant hypokalemia at 2.5.  Mild hyponatremia although likely secondary to hyperglycemia of 172.  Hypochloremia at 90.  He has elevated AST, ALT, alk phos, total bili.  Patient is mildly positive for ethanol at 10.  Urinalysis unremarkable.  Hypomagnesia  at 1.4.  Negative for COVID and flu.  Urine drug screen shows positive for benzodiazepines and THC.  Unclear if patient would have been positive for benzos previously due to receiving Ativan for his seizure.  CT of head shows no acute intracranial abnormality.  For the patient's hypokalemia, they were given 40 mg of p.o. potassium as  well as 10 mill equivalents every hour for the next 3 hours.  EKG shows sinus tachycardia with repolar abnormalities.  Given the setting of seizures with alcohol withdrawal and hypokalemia, Dr. Leonides Schanz with [facility] was paged who returned my call promptly.  He reports that this  case can be handled with the medicine team although he will keep him on his list.  Discussed this case with Dr. Cherylann Ratel who will admit to medicine for the seizures, alcohol withdrawal, hypokalemia.  He advised me to give the patient a 2 g Keppra load and will admit the patient.  ED Diagnoses Final diagnoses:  Elevated LFTs  DTs (delirium tremens) ([place])  Alcohol withdrawal syndrome with complication ([place])  Hypomagnesemia  Hypophosphatemia  Hypokalemia    Rx / DC Orders ED Discharge Orders     None         Sherrell Puller, Hershal Coria 09/08/21 2247    Godfrey Pick, MD 09/09/21 1051

## 2021-09-07 NOTE — ED Notes (Signed)
Lab reports adding on magnesium and phosphorus level to previous blood draw.

## 2021-09-07 NOTE — ED Notes (Signed)
X 2 gold tubes sent to lab for hold.

## 2021-09-08 DIAGNOSIS — E876 Hypokalemia: Secondary | ICD-10-CM

## 2021-09-08 LAB — BASIC METABOLIC PANEL
Anion gap: 8 (ref 5–15)
BUN: 10 mg/dL (ref 6–20)
CO2: 26 mmol/L (ref 22–32)
Calcium: 8.1 mg/dL — ABNORMAL LOW (ref 8.9–10.3)
Chloride: 102 mmol/L (ref 98–111)
Creatinine, Ser: 0.94 mg/dL (ref 0.61–1.24)
GFR, Estimated: >60 mL/min (ref >60)
Glucose, Bld: 110 mg/dL — ABNORMAL HIGH (ref 70–99)
Potassium: 3.4 mmol/L — ABNORMAL LOW (ref 3.5–5.1)
Sodium: 136 mmol/L (ref 135–145)

## 2021-09-08 LAB — COMPREHENSIVE METABOLIC PANEL
ALT: 63 U/L — ABNORMAL HIGH (ref 0–44)
AST: 186 U/L — ABNORMAL HIGH (ref 15–41)
Albumin: 3.7 g/dL (ref 3.5–5.0)
Alkaline Phosphatase: 160 U/L — ABNORMAL HIGH (ref 38–126)
Anion gap: 9 (ref 5–15)
BUN: 9 mg/dL (ref 6–20)
CO2: 28 mmol/L (ref 22–32)
Calcium: 8.3 mg/dL — ABNORMAL LOW (ref 8.9–10.3)
Chloride: 99 mmol/L (ref 98–111)
Creatinine, Ser: 0.91 mg/dL (ref 0.61–1.24)
GFR, Estimated: >60 mL/min (ref >60)
Glucose, Bld: 103 mg/dL — ABNORMAL HIGH (ref 70–99)
Potassium: 2.9 mmol/L — ABNORMAL LOW (ref 3.5–5.1)
Sodium: 136 mmol/L (ref 135–145)
Total Bilirubin: 2.3 mg/dL — ABNORMAL HIGH (ref 0.3–1.2)
Total Protein: 6.3 g/dL — ABNORMAL LOW (ref 6.5–8.1)

## 2021-09-08 LAB — URINALYSIS, ROUTINE W REFLEX MICROSCOPIC
Bilirubin Urine: NEGATIVE
Glucose, UA: NEGATIVE mg/dL
Hgb urine dipstick: NEGATIVE
Ketones, ur: NEGATIVE mg/dL
Leukocytes,Ua: NEGATIVE
Nitrite: NEGATIVE
Protein, ur: NEGATIVE mg/dL
Specific Gravity, Urine: 1.012 (ref 1.005–1.030)
pH: 8 (ref 5.0–8.0)

## 2021-09-08 LAB — RAPID URINE DRUG SCREEN, HOSP PERFORMED
Amphetamines: NOT DETECTED
Barbiturates: NOT DETECTED
Benzodiazepines: POSITIVE — AB
Cocaine: NOT DETECTED
Opiates: NOT DETECTED
Tetrahydrocannabinol: POSITIVE — AB

## 2021-09-08 LAB — CBC
HCT: 36.1 % — ABNORMAL LOW (ref 39.0–52.0)
Hemoglobin: 12.1 g/dL — ABNORMAL LOW (ref 13.0–17.0)
MCH: 33.7 pg (ref 26.0–34.0)
MCHC: 33.5 g/dL (ref 30.0–36.0)
MCV: 100.6 fL — ABNORMAL HIGH (ref 80.0–100.0)
Platelets: 148 10*3/uL — ABNORMAL LOW (ref 150–400)
RBC: 3.59 MIL/uL — ABNORMAL LOW (ref 4.22–5.81)
RDW: 15.9 % — ABNORMAL HIGH (ref 11.5–15.5)
WBC: 9.1 10*3/uL (ref 4.0–10.5)
nRBC: 0 % (ref 0.0–0.2)

## 2021-09-08 LAB — PROTIME-INR
INR: 1.1 (ref 0.8–1.2)
Prothrombin Time: 14 seconds (ref 11.4–15.2)

## 2021-09-08 LAB — GLUCOSE, CAPILLARY: Glucose-Capillary: 107 mg/dL — ABNORMAL HIGH (ref 70–99)

## 2021-09-08 LAB — FOLATE: Folate: 8 ng/mL (ref >5.9)

## 2021-09-08 LAB — MAGNESIUM: Magnesium: 2 mg/dL (ref 1.7–2.4)

## 2021-09-08 LAB — VITAMIN B12: Vitamin B-12: 575 pg/mL (ref 180–914)

## 2021-09-08 MED ORDER — POTASSIUM CHLORIDE CRYS ER 20 MEQ PO TBCR
40.0000 meq | EXTENDED_RELEASE_TABLET | Freq: Once | ORAL | Status: AC
  Administered 2021-09-08: 40 meq via ORAL
  Filled 2021-09-08: qty 2

## 2021-09-08 MED ORDER — POTASSIUM CHLORIDE CRYS ER 20 MEQ PO TBCR: 40.0000 meq | EXTENDED_RELEASE_TABLET | ORAL | Status: DC

## 2021-09-08 MED ORDER — POTASSIUM CHLORIDE 10 MEQ/100ML IV SOLN: 10.0000 meq | INTRAVENOUS | Status: AC

## 2021-09-08 MED ORDER — POTASSIUM CHLORIDE 10 MEQ/100ML IV SOLN
10.0000 meq | INTRAVENOUS | Status: AC
  Administered 2021-09-08 (×3): 10 meq via INTRAVENOUS
  Filled 2021-09-08 (×3): qty 100

## 2021-09-08 MED ORDER — GABAPENTIN 300 MG PO CAPS
300.0000 mg | ORAL_CAPSULE | Freq: Three times a day (TID) | ORAL | Status: DC
  Administered 2021-09-08 – 2021-09-23 (×44): 300 mg via ORAL
  Filled 2021-09-08 (×45): qty 1

## 2021-09-08 MED ORDER — POTASSIUM CHLORIDE CRYS ER 20 MEQ PO TBCR: 40.0000 meq | EXTENDED_RELEASE_TABLET | Freq: Every day | ORAL | Status: DC

## 2021-09-08 MED ORDER — LABETALOL HCL 5 MG/ML IV SOLN: 10.0000 mg | INTRAVENOUS | Status: DC | PRN

## 2021-09-08 MED ORDER — SERTRALINE HCL 25 MG PO TABS
25.0000 mg | ORAL_TABLET | Freq: Every day | ORAL | Status: DC
  Administered 2021-09-08 – 2021-09-23 (×16): 25 mg via ORAL
  Filled 2021-09-08 (×16): qty 1

## 2021-09-08 MED ORDER — PANTOPRAZOLE SODIUM 40 MG PO TBEC
40.0000 mg | DELAYED_RELEASE_TABLET | Freq: Every day | ORAL | Status: DC
  Administered 2021-09-08 – 2021-09-23 (×16): 40 mg via ORAL
  Filled 2021-09-08 (×16): qty 1

## 2021-09-08 MED ORDER — HYDRALAZINE HCL 25 MG PO TABS: 25.0000 mg | ORAL_TABLET | ORAL | Status: DC | PRN

## 2021-09-08 MED ORDER — QUETIAPINE FUMARATE 50 MG PO TABS
200.0000 mg | ORAL_TABLET | Freq: Every day | ORAL | Status: DC
  Administered 2021-09-08 – 2021-09-22 (×14): 200 mg via ORAL
  Filled 2021-09-08: qty 4
  Filled 2021-09-08: qty 2
  Filled 2021-09-08: qty 4
  Filled 2021-09-08: qty 2
  Filled 2021-09-08: qty 4
  Filled 2021-09-08 (×3): qty 2
  Filled 2021-09-08 (×6): qty 4

## 2021-09-08 NOTE — Assessment & Plan Note (Signed)
-   replete as needed 

## 2021-09-08 NOTE — Hospital Course (Signed)
[name] is a 51 yo male with PMH alcohol abuse, alcoholic cirrhosis, anxiety/depression, PTSD, Schizoaffective disorder, seizure disorder, HTN, GERD who presented to the hospital after developing withdrawal symptoms and thinking he may  have had a seizure at home. He endorsed drinking 4-5 "4 Loko's" per day.  On workup in the ER he was witnessed to have a seizure and was treated with ativan with good response followed by Keppra loading.  He was admitted for further alcohol withdrawal treatment.

## 2021-09-08 NOTE — Assessment & Plan Note (Signed)
--   continue protonix  ?

## 2021-09-08 NOTE — Assessment & Plan Note (Addendum)
-   noted on CT A/P on 08/26/21 and RUQ u/s on 09/07/21 - at risk for cirrhosis with ongoing etoh abuse  -Nonobstructive LFT pattern, elevated from underlying alcohol use.  Does not meet criteria for alcoholic hepatitis at this time although low maddery discriminant function regardless and would not meet criteria for steroids

## 2021-09-08 NOTE — Assessment & Plan Note (Addendum)
-   patient endorses 4-5 "Four Loko" daily and felt as if he had a seizure prior to presentation; he was witnessed to have one in ER holding on admission as well - s/p ativan during seizure and Keppra load - continue CIWA and ativan PRN for any further seizure activity; if does recur may need to consider empiric intubation and medical sedation to support thru severe withdrawals - overall responding to ativan but still scoring moderately high of CIWA;  -Became more agitated and aggressive on 09/09/2021, reasonable for trial of Precedex; much improved this morning, Precedex almost weaned off - continue thiamine, folic, MVI -PT ordered to start getting him up and moving

## 2021-09-08 NOTE — Progress Notes (Signed)
Progress Note    [name]   IOM:[id number]  DOB: 01/25/1971  DOA: 09/07/2021     1 PCP: [name], MD  Initial CC: alcohol abuse, seizure  Hospital Course: [name] is a 51 yo male with PMH alcohol abuse, alcoholic cirrhosis, anxiety/depression, PTSD, Schizoaffective disorder, seizure disorder, HTN, GERD who presented to the hospital after developing withdrawal symptoms and thinking he may  have had a seizure at home. He endorsed drinking 4-5 "4 Loko's" per day.  On workup in the ER he was witnessed to have a seizure and was treated with ativan with good response followed by Keppra loading.  He was admitted for further alcohol withdrawal treatment.   Interval History:  Resting in bed this am. Reviewed his HPI with him; he wishes to remain abstinent from alcohol.  I also discussed the risks with him with quitting cold [place].  Assessment & Plan: * Alcohol withdrawal seizure with complication ([place]) - patient endorses 4-5 "Four Loko" daily and felt as if he had a seizure prior to presentation; he was witnessed to have one in ER holding on admission as well - s/p ativan during seizure and Keppra load - continue CIWA and ativan PRN for any further seizure activity; if does recur may need to consider empiric intubation and medical sedation to support thru severe withdrawals - continue thiamine, folic, MVI  Hypokalemia- (present on admission) - replete as needed  Hepatic steatosis- (present on admission) - noted on CT A/P on 08/26/21 and RUQ u/s on 09/07/21 - at risk for cirrhosis with ongoing etoh abuse  -Nonobstructive LFT pattern, elevated from underlying alcohol use.  Does not meet criteria for alcoholic hepatitis at this time although low maddery discriminant function regardless and would not meet criteria for steroids  Macrocytic anemia - check folate (8) and B12 (575)  HTN (hypertension)- (present on admission) - no home meds noted aside from spironolactone - hold  aldactone - use labetalol or hydralazine PRN  GERD (gastroesophageal reflux disease)- (present on admission) - continue protonix   Schizoaffective disorder, depressive type ([place])- (present on admission) - resume seroquel and zoloft    Old records reviewed in assessment of this patient  Antimicrobials:   DVT prophylaxis: SCD  Code Status:   Code Status: Full Code  Disposition Plan:  Home Status is: Inpt  Objective: Blood pressure 124/83, pulse 78, temperature 97.9 F (36.6 C), temperature source Oral, resp. rate (!) 24, height 6' (1.829 m), weight 94.7 kg, SpO2 95 %.  Examination:  Physical Exam Constitutional:      Comments: Lethargic appearing but no distress  HENT:     Head: Normocephalic and atraumatic.     Mouth/Throat:     Mouth: Mucous membranes are moist.  Eyes:     Extraocular Movements: Extraocular movements intact.  Cardiovascular:     Rate and Rhythm: Normal rate and regular rhythm.     Heart sounds: Normal heart sounds.  Pulmonary:     Effort: Pulmonary effort is normal. No respiratory distress.     Breath sounds: Normal breath sounds. No wheezing.  Abdominal:     General: Bowel sounds are normal. There is no distension.     Palpations: Abdomen is soft.     Tenderness: There is no abdominal tenderness.  Musculoskeletal:        General: Normal range of motion.     Cervical back: Normal range of motion and neck supple.  Skin:    General: Skin is warm and dry.  Neurological:  General: No focal deficit present.     Mental Status: He is alert.  Psychiatric:        Mood and Affect: Mood normal.        Behavior: Behavior normal.     Consultants:    Procedures:    Data Reviewed: I have personally reviewed labs and imaging studies    LOS: 1 day  Time spent: Greater than 50% of the 35 minute visit was spent in counseling/coordination of care for the patient as laid out in the A&P.   [name], MD [facility] 09/08/2021, 1:07 PM

## 2021-09-08 NOTE — Assessment & Plan Note (Addendum)
-   Continue seroquel and zoloft

## 2021-09-08 NOTE — Assessment & Plan Note (Signed)
-   no home meds noted aside from spironolactone - hold aldactone - use labetalol or hydralazine PRN

## 2021-09-08 NOTE — Assessment & Plan Note (Addendum)
-   check folate (8) and B12 (575)

## 2021-09-09 LAB — CBC WITH DIFFERENTIAL/PLATELET
Abs Immature Granulocytes: 0.06 10*3/uL (ref 0.00–0.07)
Basophils Absolute: 0 10*3/uL (ref 0.0–0.1)
Basophils Relative: 0 %
Eosinophils Absolute: 0.1 10*3/uL (ref 0.0–0.5)
Eosinophils Relative: 1 %
HCT: 34.3 % — ABNORMAL LOW (ref 39.0–52.0)
Hemoglobin: 11.4 g/dL — ABNORMAL LOW (ref 13.0–17.0)
Immature Granulocytes: 1 %
Lymphocytes Relative: 22 %
Lymphs Abs: 2 10*3/uL (ref 0.7–4.0)
MCH: 33.7 pg (ref 26.0–34.0)
MCHC: 33.2 g/dL (ref 30.0–36.0)
MCV: 101.5 fL — ABNORMAL HIGH (ref 80.0–100.0)
Monocytes Absolute: 0.5 10*3/uL (ref 0.1–1.0)
Monocytes Relative: 6 %
Neutro Abs: 6.6 10*3/uL (ref 1.7–7.7)
Neutrophils Relative %: 70 %
Platelets: 132 10*3/uL — ABNORMAL LOW (ref 150–400)
RBC: 3.38 MIL/uL — ABNORMAL LOW (ref 4.22–5.81)
RDW: 15.8 % — ABNORMAL HIGH (ref 11.5–15.5)
WBC: 9.3 10*3/uL (ref 4.0–10.5)
nRBC: 0 % (ref 0.0–0.2)

## 2021-09-09 LAB — HEMOGLOBIN A1C
Hgb A1c MFr Bld: 5.5 % (ref 4.8–5.6)
Mean Plasma Glucose: 111 mg/dL

## 2021-09-09 LAB — COMPREHENSIVE METABOLIC PANEL
ALT: 98 U/L — ABNORMAL HIGH (ref 0–44)
AST: 283 U/L — ABNORMAL HIGH (ref 15–41)
Albumin: 3.5 g/dL (ref 3.5–5.0)
Alkaline Phosphatase: 161 U/L — ABNORMAL HIGH (ref 38–126)
Anion gap: 8 (ref 5–15)
BUN: 11 mg/dL (ref 6–20)
CO2: 22 mmol/L (ref 22–32)
Calcium: 8.5 mg/dL — ABNORMAL LOW (ref 8.9–10.3)
Chloride: 104 mmol/L (ref 98–111)
Creatinine, Ser: 0.95 mg/dL (ref 0.61–1.24)
GFR, Estimated: >60 mL/min (ref >60)
Glucose, Bld: 94 mg/dL (ref 70–99)
Potassium: 3.8 mmol/L (ref 3.5–5.1)
Sodium: 134 mmol/L — ABNORMAL LOW (ref 135–145)
Total Bilirubin: 2 mg/dL — ABNORMAL HIGH (ref 0.3–1.2)
Total Protein: 6.3 g/dL — ABNORMAL LOW (ref 6.5–8.1)

## 2021-09-09 LAB — MAGNESIUM: Magnesium: 1.7 mg/dL (ref 1.7–2.4)

## 2021-09-09 MED ORDER — DEXMEDETOMIDINE HCL IN NACL 200 MCG/50ML IV SOLN
0.4000 ug/kg/h | INTRAVENOUS | Status: DC
  Administered 2021-09-09 (×2): 0.4 ug/kg/h via INTRAVENOUS
  Filled 2021-09-09 (×2): qty 50

## 2021-09-09 MED ORDER — NICOTINE 21 MG/24HR TD PT24
21.0000 mg | MEDICATED_PATCH | Freq: Every day | TRANSDERMAL | Status: DC
  Administered 2021-09-09 – 2021-09-23 (×15): 21 mg via TRANSDERMAL
  Filled 2021-09-09 (×15): qty 1

## 2021-09-09 MED ORDER — OXYCODONE HCL 5 MG PO TABS
5.0000 mg | ORAL_TABLET | ORAL | Status: DC | PRN
  Administered 2021-09-09 – 2021-09-23 (×52): 5 mg via ORAL
  Filled 2021-09-09 (×53): qty 1

## 2021-09-09 NOTE — Progress Notes (Signed)
Progress Note    [name]   ZOX:[id number]  DOB: [birth year]  DOA: 09/07/2021     2 PCP: [name], MD  Initial CC: alcohol abuse, seizure  Hospital Course: [name] is a 51 yo male with PMH alcohol abuse, alcoholic cirrhosis, anxiety/depression, PTSD, Schizoaffective disorder, seizure disorder, HTN, GERD who presented to the hospital after developing withdrawal symptoms and thinking he may  have had a seizure at home. He endorsed drinking 4-5 "4 Loko's" per day.  On workup in the ER he was witnessed to have a seizure and was treated with ativan with good response followed by Keppra loading.  He was admitted for further alcohol withdrawal treatment.   Interval History:  Resting in bed this am. More confused as expected with ongoing withdrawal. No distress and is cooperative still.   Assessment & Plan: * Alcohol withdrawal seizure with complication ([place]) - patient endorses 4-5 "Four Loko" daily and felt as if he had a seizure prior to presentation; he was witnessed to have one in ER holding on admission as well - s/p ativan during seizure and Keppra load - continue CIWA and ativan PRN for any further seizure activity; if does recur may need to consider empiric intubation and medical sedation to support thru severe withdrawals - overall responding to ativan but still scoring moderately high of CIWA; favor to continue current plan as ativan is standard of care (technically poor data on precedex and does not prevent progression to seizures or severe DTs) - continue thiamine, folic, MVI  Hypokalemia- (present on admission) - replete as needed  Hepatic steatosis- (present on admission) - noted on CT A/P on 08/26/21 and RUQ u/s on 09/07/21 - at risk for cirrhosis with ongoing etoh abuse  -Nonobstructive LFT pattern, elevated from underlying alcohol use.  Does not meet criteria for alcoholic hepatitis at this time although low maddery discriminant function regardless and would not  meet criteria for steroids  Macrocytic anemia - check folate (8) and B12 (575)  HTN (hypertension)- (present on admission) - no home meds noted aside from spironolactone - hold aldactone - use labetalol or hydralazine PRN  GERD (gastroesophageal reflux disease)- (present on admission) - continue protonix   Schizoaffective disorder, depressive type ([place])- (present on admission) - resume seroquel and zoloft    Old records reviewed in assessment of this patient  Antimicrobials:   DVT prophylaxis: SCD  Code Status:   Code Status: Full Code  Disposition Plan:  Home Status is: Inpt  Objective: Blood pressure (!) 144/70, pulse 98, temperature 98.2 F (36.8 C), temperature source Axillary, resp. rate (!) 24, height 6' (1.829 m), weight 94.7 kg, SpO2 97 %.  Examination:  Physical Exam Constitutional:      Comments: Lethargic appearing but no distress  HENT:     Head: Normocephalic and atraumatic.     Mouth/Throat:     Mouth: Mucous membranes are moist.  Eyes:     Extraocular Movements: Extraocular movements intact.  Cardiovascular:     Rate and Rhythm: Normal rate and regular rhythm.     Heart sounds: Normal heart sounds.  Pulmonary:     Effort: Pulmonary effort is normal. No respiratory distress.     Breath sounds: Normal breath sounds. No wheezing.  Abdominal:     General: Bowel sounds are normal. There is no distension.     Palpations: Abdomen is soft.     Tenderness: There is no abdominal tenderness.  Musculoskeletal:        General: Normal  range of motion.     Cervical back: Normal range of motion and neck supple.  Skin:    General: Skin is warm and dry.  Neurological:     General: No focal deficit present.     Mental Status: He is alert.     Comments: More confusion appreciated but he is cooperative   Psychiatric:        Mood and Affect: Mood normal.        Behavior: Behavior normal.     Consultants:    Procedures:    Data Reviewed: I have  personally reviewed labs and imaging studies    LOS: 2 days  Time spent: Greater than 50% of the 35 minute visit was spent in counseling/coordination of care for the patient as laid out in the A&P.   [name], MD [facility] 09/09/2021, 12:46 PM

## 2021-09-09 NOTE — Plan of Care (Signed)
°  Problem: Safety: Goal: Verbalization of understanding the information provided will improve Outcome: Progressing

## 2021-09-10 DIAGNOSIS — I10 Essential (primary) hypertension: Secondary | ICD-10-CM

## 2021-09-10 LAB — COMPREHENSIVE METABOLIC PANEL
ALT: 101 U/L — ABNORMAL HIGH (ref 0–44)
AST: 180 U/L — ABNORMAL HIGH (ref 15–41)
Albumin: 3.2 g/dL — ABNORMAL LOW (ref 3.5–5.0)
Alkaline Phosphatase: 161 U/L — ABNORMAL HIGH (ref 38–126)
Anion gap: 10 (ref 5–15)
BUN: 11 mg/dL (ref 6–20)
CO2: 17 mmol/L — ABNORMAL LOW (ref 22–32)
Calcium: 8.6 mg/dL — ABNORMAL LOW (ref 8.9–10.3)
Chloride: 107 mmol/L (ref 98–111)
Creatinine, Ser: 0.86 mg/dL (ref 0.61–1.24)
GFR, Estimated: >60 mL/min (ref >60)
Glucose, Bld: 82 mg/dL (ref 70–99)
Potassium: 4.1 mmol/L (ref 3.5–5.1)
Sodium: 134 mmol/L — ABNORMAL LOW (ref 135–145)
Total Bilirubin: 1.7 mg/dL — ABNORMAL HIGH (ref 0.3–1.2)
Total Protein: 6.5 g/dL (ref 6.5–8.1)

## 2021-09-10 LAB — CBC WITH DIFFERENTIAL/PLATELET
Abs Immature Granulocytes: 0.1 10*3/uL — ABNORMAL HIGH (ref 0.00–0.07)
Basophils Absolute: 0.1 10*3/uL (ref 0.0–0.1)
Basophils Relative: 1 %
Eosinophils Absolute: 0.2 10*3/uL (ref 0.0–0.5)
Eosinophils Relative: 2 %
HCT: 38.3 % — ABNORMAL LOW (ref 39.0–52.0)
Hemoglobin: 12.9 g/dL — ABNORMAL LOW (ref 13.0–17.0)
Immature Granulocytes: 1 %
Lymphocytes Relative: 21 %
Lymphs Abs: 1.7 10*3/uL (ref 0.7–4.0)
MCH: 33.9 pg (ref 26.0–34.0)
MCHC: 33.7 g/dL (ref 30.0–36.0)
MCV: 100.8 fL — ABNORMAL HIGH (ref 80.0–100.0)
Monocytes Absolute: 0.6 10*3/uL (ref 0.1–1.0)
Monocytes Relative: 7 %
Neutro Abs: 5.3 10*3/uL (ref 1.7–7.7)
Neutrophils Relative %: 68 %
Platelets: 142 10*3/uL — ABNORMAL LOW (ref 150–400)
RBC: 3.8 MIL/uL — ABNORMAL LOW (ref 4.22–5.81)
RDW: 16.4 % — ABNORMAL HIGH (ref 11.5–15.5)
WBC: 7.8 10*3/uL (ref 4.0–10.5)
nRBC: 0 % (ref 0.0–0.2)

## 2021-09-10 LAB — MAGNESIUM: Magnesium: 1.8 mg/dL (ref 1.7–2.4)

## 2021-09-10 MED ORDER — POLYVINYL ALCOHOL 1.4 % OP SOLN
1.0000 [drp] | OPHTHALMIC | Status: DC | PRN
  Administered 2021-09-15 (×2): 1 [drp] via OPHTHALMIC
  Filled 2021-09-10 (×2): qty 15

## 2021-09-10 MED ORDER — LORAZEPAM 1 MG PO TABS
1.0000 mg | ORAL_TABLET | ORAL | Status: DC | PRN
  Administered 2021-09-11: 06:00:00 2 mg via ORAL
  Filled 2021-09-10: qty 2

## 2021-09-10 MED ORDER — DEXMEDETOMIDINE HCL IN NACL 400 MCG/100ML IV SOLN
0.4000 ug/kg/h | INTRAVENOUS | Status: DC
  Administered 2021-09-10: 01:00:00 0.4 ug/kg/h via INTRAVENOUS
  Filled 2021-09-10 (×3): qty 100

## 2021-09-10 MED ORDER — LORAZEPAM 2 MG/ML IJ SOLN: 1.0000 mg | INTRAMUSCULAR | Status: DC | PRN

## 2021-09-10 NOTE — Progress Notes (Signed)
Progress Note    [name]   VHQ:[id number]  DOB: 1970/10/05  DOA: 09/07/2021     3 PCP: Ryan Mai, MD  Initial CC: alcohol abuse, seizure  Hospital Course: [name] is a 51 yo male with PMH alcohol abuse, alcoholic cirrhosis, anxiety/depression, PTSD, Schizoaffective disorder, seizure disorder, HTN, GERD who presented to the hospital after developing withdrawal symptoms and thinking he may  have had a seizure at home. He endorsed drinking 4-5 "4 Loko's" per day.  On workup in the ER he was witnessed to have a seizure and was treated with ativan with good response followed by Keppra loading.  He was admitted for further alcohol withdrawal treatment.   Interval History:  Tolerated Precedex fairly well overnight.  Much less agitated this morning.  Precedex still running.  He does have ongoing memory impairment and does not quite remember all the events of hospitalization however appetite remains adequate and he was seen eating breakfast comfortably resting in bed and cooperative.  Assessment & Plan: * Alcohol withdrawal seizure with complication ([place]) - patient endorses 4-5 "Four Loko" daily and felt as if he had a seizure prior to presentation; he was witnessed to have one in ER holding on admission as well - s/p ativan during seizure and Keppra load - continue CIWA and ativan PRN for any further seizure activity; if does recur may need to consider empiric intubation and medical sedation to support thru severe withdrawals - overall responding to ativan but still scoring moderately high of CIWA;  -Became more agitated and aggressive on 09/09/2021, reasonable for trial of Precedex; much improved this morning, Precedex almost weaned off - continue thiamine, folic, MVI -PT ordered to start getting him up and moving  Hypokalemia- (present on admission) - replete as needed  Hepatic steatosis- (present on admission) - noted on CT A/P on 08/26/21 and RUQ u/s on 09/07/21 - at risk  for cirrhosis with ongoing etoh abuse  -Nonobstructive LFT pattern, elevated from underlying alcohol use.  Does not meet criteria for alcoholic hepatitis at this time although low maddery discriminant function regardless and would not meet criteria for steroids  Macrocytic anemia - check folate (8) and B12 (575)  HTN (hypertension)- (present on admission) - no home meds noted aside from spironolactone - hold aldactone - use labetalol or hydralazine PRN  GERD (gastroesophageal reflux disease)- (present on admission) - continue protonix   Schizoaffective disorder, depressive type ([place])- (present on admission) - Continue seroquel and zoloft    Old records reviewed in assessment of this patient  Antimicrobials:   DVT prophylaxis: SCD  Code Status:   Code Status: Full Code  Disposition Plan:  Home Status is: Inpt  Objective: Blood pressure (!) 176/92, pulse 67, temperature 98.5 F (36.9 C), temperature source Oral, resp. rate (!) 24, height 6' (1.829 m), weight 94.7 kg, SpO2 95 %.  Examination:  Physical Exam Constitutional:      Comments: Lethargic appearing but no distress  HENT:     Head: Normocephalic and atraumatic.     Mouth/Throat:     Mouth: Mucous membranes are moist.  Eyes:     Extraocular Movements: Extraocular movements intact.  Cardiovascular:     Rate and Rhythm: Normal rate and regular rhythm.     Heart sounds: Normal heart sounds.  Pulmonary:     Effort: Pulmonary effort is normal. No respiratory distress.     Breath sounds: Normal breath sounds. No wheezing.  Abdominal:     General: Bowel sounds are normal.  There is no distension.     Palpations: Abdomen is soft.     Tenderness: There is no abdominal tenderness.  Musculoskeletal:        General: Normal range of motion.     Cervical back: Normal range of motion and neck supple.  Skin:    General: Skin is warm and dry.  Neurological:     General: No focal deficit present.     Mental Status: He is  alert.     Comments: More confusion appreciated but he is cooperative   Psychiatric:        Mood and Affect: Mood normal.        Behavior: Behavior normal.     Consultants:    Procedures:    Data Reviewed: I have personally reviewed labs and imaging studies    LOS: 3 days  Critical care time to evaluate and treat this patient was 35 minutes.  Independent of separate billable services  This patient is critically ill with the following life-threatening issues requiring my presence at the bedside: Cardiac rhythm disturbances requiring evaluation and/or interventions Fluctuations in neurologic function requiring evaluation and/or interventions and/or fluid/volume titration   Ryan Dee, MD [facility] 09/10/2021, 2:11 PM

## 2021-09-10 NOTE — TOC Progression Note (Signed)
Transition of Care [employer] [facility]) - Progression Note    Patient Details  Name: [name] MRN: [id number] Date of Birth: 11/28/1970  Transition of [facility]) CM/SW Contact  Ross Ludwig, [place] Phone Number: 09/10/2021, 6:12 PM  Clinical Narrative:     CSW received consult that patient needs substance abuse resources, CSW added to patient's AVS.        Expected Discharge Plan and Services                                                 Social Determinants of Health (SDOH) Interventions    Readmission Risk Interventions Readmission Risk Prevention Plan 01/15/2019  Medication Screening Complete  Transportation Screening Complete  Some recent data might be hidden

## 2021-09-10 NOTE — Discharge Instructions (Signed)
Outpatient Substance Use Treatment Services   [facility] [address] [place], [place] [address]  [phone] Private insurance, Medicare A&B, and GCCN   [facility])  [address]  [place], [place] [address] [phone] Medicaid, Self Pay   [facility]      [address]  [place], [place] [phone] Medicaid and Private Insurance, Self Pay   [facility] [address]  [place], [place]  [phone] Private Insurance, and Self Pay  [facility]      [address]    [place], [place] [address]  [phone] or [phone] Private Insurance Only   [facility] [address]  [place], [place] [address] [phone] Medicaid, Medicare, Private Insurance  [facility] [address]  [place], [place] [address] [phone] Services are free or reduced  [facility]  [address] [phone]  Self Pay only, sliding scale  [facility]  [address]  [place], [place] [address] [phone] ([facility]) Self Pay, Medicaid Only   [facility] [address]  [place], [place] [address] [phone] Medicaid, Medicare, Private Insurance  Residential Substance Use Treatment Services   [facility]  [address]  [place], [place] [address]  [phone] or [phone] Detox (Medicare, Medicaid, private insurance, and self pay)  Residential Rehab 14 days (Medicare, Medicaid, private insurance, and self pay)   [facility])  [address] [place], [place]  [phone]  Male and Male Detox (Self Pay and Medicaid limited availability)  Rehab only Male (Medicaid and self pay only)   [facility]      [address]   [place], [place] [address]  [phone] or [phone] Detox and Residential Treatment Private Insurance Only   [facility]  [address]  [place], [place] [address]  [phone]  Treatment Only, must make assessment appointment, and must be sober for assessment appointment.  Self Pay Only, Medicare A&B, [place] Medicaid, [place] ID only! *Transportation assistance offered from [employer] [address]  [facility]     [address] [place], [place] [address] Walk in interviews M-Sat 8-4p No pending legal charges [phone]  [facility]:  [facility]  [address] [place], [place] [phone] (Self Pay, Medicaid)  [facility] [address] [place], [place] [address] [phone] Detox and Residential Treatment Medicare and Private Insurance  [facility] [address]  [place], [place] [address] 28 Day Women's Facility: [phone] 28 Day Men's Facility: [phone] Long-term Residential Program:  [phone] Males 25 and Over (No Insurance, upfront fee)  [facility]  [address] [place], [place] [address] [phone] Private Insurance with Cign[employer], Private Pay  [facility] [address] [place], [place] [address] Local [phone] Private Insurance Only  [facility] [address]  [place], [place] [address]  [phone] (Males, upfront fee)  [facility] [place] [address]  [place], [address] [phone] Private Insurance   [facility] Locations  [facility]  [address]  [place], [place]  [phone] Christian Based Program for individuals experiencing homelessness Self Pay, No insurance  [facility]  Men's program: [facility] [address]  [place], [place] [address] [phone]  [facility] Women[facility]: [facility] [address]   [place], [place] [address] [phone] Christian Based Program for individuals  experiencing homelessness Self Pay, No insurance  [facility] [address]  [place], [place] [address]  [phone] Christian Based Program for individuals experiencing homelessness Self Pay, No insurance  [facility] [address]  [place], [place] [address] [phone] Christian Based Program for individuals experiencing homelessness Self Pay, No insurance  [facility] [address] [place], [place] [phone] Christian Based Program for males experiencing homelessness Self Pay, No insurance                    

## 2021-09-11 DIAGNOSIS — R7989 Other specified abnormal findings of blood chemistry: Secondary | ICD-10-CM

## 2021-09-11 DIAGNOSIS — K760 Fatty (change of) liver, not elsewhere classified: Secondary | ICD-10-CM

## 2021-09-11 LAB — CBC WITH DIFFERENTIAL/PLATELET
Abs Immature Granulocytes: 0.06 10*3/uL (ref 0.00–0.07)
Basophils Absolute: 0 10*3/uL (ref 0.0–0.1)
Basophils Relative: 0 %
Eosinophils Absolute: 0.1 10*3/uL (ref 0.0–0.5)
Eosinophils Relative: 2 %
HCT: 34 % — ABNORMAL LOW (ref 39.0–52.0)
Hemoglobin: 11.5 g/dL — ABNORMAL LOW (ref 13.0–17.0)
Immature Granulocytes: 1 %
Lymphocytes Relative: 28 %
Lymphs Abs: 2.2 10*3/uL (ref 0.7–4.0)
MCH: 34.3 pg — ABNORMAL HIGH (ref 26.0–34.0)
MCHC: 33.8 g/dL (ref 30.0–36.0)
MCV: 101.5 fL — ABNORMAL HIGH (ref 80.0–100.0)
Monocytes Absolute: 0.9 10*3/uL (ref 0.1–1.0)
Monocytes Relative: 11 %
Neutro Abs: 4.5 10*3/uL (ref 1.7–7.7)
Neutrophils Relative %: 58 %
Platelets: 174 10*3/uL (ref 150–400)
RBC: 3.35 MIL/uL — ABNORMAL LOW (ref 4.22–5.81)
RDW: 16.5 % — ABNORMAL HIGH (ref 11.5–15.5)
WBC: 7.7 10*3/uL (ref 4.0–10.5)
nRBC: 0 % (ref 0.0–0.2)

## 2021-09-11 LAB — COMPREHENSIVE METABOLIC PANEL
ALT: 94 U/L — ABNORMAL HIGH (ref 0–44)
AST: 136 U/L — ABNORMAL HIGH (ref 15–41)
Albumin: 3.1 g/dL — ABNORMAL LOW (ref 3.5–5.0)
Alkaline Phosphatase: 155 U/L — ABNORMAL HIGH (ref 38–126)
Anion gap: 7 (ref 5–15)
BUN: 14 mg/dL (ref 6–20)
CO2: 20 mmol/L — ABNORMAL LOW (ref 22–32)
Calcium: 8.6 mg/dL — ABNORMAL LOW (ref 8.9–10.3)
Chloride: 106 mmol/L (ref 98–111)
Creatinine, Ser: 0.96 mg/dL (ref 0.61–1.24)
GFR, Estimated: >60 mL/min (ref >60)
Glucose, Bld: 126 mg/dL — ABNORMAL HIGH (ref 70–99)
Potassium: 3.6 mmol/L (ref 3.5–5.1)
Sodium: 133 mmol/L — ABNORMAL LOW (ref 135–145)
Total Bilirubin: 1.3 mg/dL — ABNORMAL HIGH (ref 0.3–1.2)
Total Protein: 6.2 g/dL — ABNORMAL LOW (ref 6.5–8.1)

## 2021-09-11 LAB — MAGNESIUM: Magnesium: 1.5 mg/dL — ABNORMAL LOW (ref 1.7–2.4)

## 2021-09-11 MED ORDER — CHLORDIAZEPOXIDE HCL 25 MG PO CAPS
25.0000 mg | ORAL_CAPSULE | Freq: Four times a day (QID) | ORAL | Status: AC | PRN
  Administered 2021-09-12: 25 mg via ORAL

## 2021-09-11 MED ORDER — LOPERAMIDE HCL 2 MG PO CAPS: 2.0000 mg | ORAL_CAPSULE | ORAL | Status: AC | PRN

## 2021-09-11 MED ORDER — ONDANSETRON 4 MG PO TBDP: 4.0000 mg | ORAL_TABLET | Freq: Four times a day (QID) | ORAL | Status: AC | PRN

## 2021-09-11 MED ORDER — CHLORDIAZEPOXIDE HCL 25 MG PO CAPS
25.0000 mg | ORAL_CAPSULE | Freq: Every day | ORAL | Status: AC
  Administered 2021-09-14: 25 mg via ORAL
  Filled 2021-09-11 (×2): qty 1

## 2021-09-11 MED ORDER — HYDROXYZINE HCL 25 MG PO TABS
25.0000 mg | ORAL_TABLET | Freq: Four times a day (QID) | ORAL | Status: AC | PRN
  Administered 2021-09-11 – 2021-09-13 (×3): 25 mg via ORAL
  Filled 2021-09-11 (×3): qty 1

## 2021-09-11 MED ORDER — K PHOS MONO-SOD PHOS DI & MONO 155-852-130 MG PO TABS
500.0000 mg | ORAL_TABLET | Freq: Two times a day (BID) | ORAL | Status: AC
  Administered 2021-09-11 (×2): 500 mg via ORAL
  Filled 2021-09-11 (×2): qty 2

## 2021-09-11 MED ORDER — CHLORDIAZEPOXIDE HCL 25 MG PO CAPS
25.0000 mg | ORAL_CAPSULE | ORAL | Status: AC
  Administered 2021-09-13 – 2021-09-14 (×2): 25 mg via ORAL
  Filled 2021-09-11 (×2): qty 1

## 2021-09-11 MED ORDER — CHLORDIAZEPOXIDE HCL 25 MG PO CAPS
25.0000 mg | ORAL_CAPSULE | Freq: Four times a day (QID) | ORAL | Status: AC
  Administered 2021-09-11 – 2021-09-12 (×5): 25 mg via ORAL
  Filled 2021-09-11 (×5): qty 1

## 2021-09-11 MED ORDER — CHLORDIAZEPOXIDE HCL 25 MG PO CAPS
25.0000 mg | ORAL_CAPSULE | Freq: Three times a day (TID) | ORAL | Status: AC
  Administered 2021-09-12 – 2021-09-13 (×3): 25 mg via ORAL
  Filled 2021-09-11 (×3): qty 1

## 2021-09-11 MED ORDER — ADULT MULTIVITAMIN W/MINERALS CH: 1.0000 | ORAL_TABLET | Freq: Every day | ORAL | Status: DC

## 2021-09-11 MED ORDER — ADULT MULTIVITAMIN W/MINERALS CH
1.0000 | ORAL_TABLET | Freq: Every day | ORAL | Status: DC
  Administered 2021-09-11 – 2021-09-23 (×13): 1 via ORAL
  Filled 2021-09-11 (×12): qty 1

## 2021-09-11 MED ORDER — MAGNESIUM OXIDE -MG SUPPLEMENT 400 (240 MG) MG PO TABS
400.0000 mg | ORAL_TABLET | Freq: Two times a day (BID) | ORAL | Status: AC
  Administered 2021-09-11 (×2): 400 mg via ORAL
  Filled 2021-09-11 (×2): qty 1

## 2021-09-11 MED ORDER — THIAMINE HCL 100 MG PO TABS
100.0000 mg | ORAL_TABLET | Freq: Every day | ORAL | Status: DC
  Administered 2021-09-11 – 2021-09-23 (×13): 100 mg via ORAL
  Filled 2021-09-11 (×13): qty 1

## 2021-09-11 MED ORDER — THIAMINE HCL 100 MG/ML IJ SOLN: 100.0000 mg | Freq: Once | INTRAMUSCULAR | Status: DC

## 2021-09-11 MED ORDER — POTASSIUM CHLORIDE 20 MEQ PO PACK: 40.0000 meq | PACK | Freq: Two times a day (BID) | ORAL | Status: DC

## 2021-09-11 MED ORDER — LEVETIRACETAM 500 MG PO TABS
500.0000 mg | ORAL_TABLET | Freq: Two times a day (BID) | ORAL | Status: DC
  Administered 2021-09-11 – 2021-09-23 (×25): 500 mg via ORAL
  Filled 2021-09-11 (×25): qty 1

## 2021-09-11 NOTE — Progress Notes (Signed)
[facility] PROGRESS NOTE    Progress Note  [name]  ZOX:[id number] DOB: 11/23/1970 DOA: 09/07/2021 PCP: [name], MD     Brief Narrative:   [name] is an 51 y.o. male past medical history of alcohol abuse and alcoholic cirrhosis, schizoaffective disorder with seizures who comes in with withdrawal symptoms thinking he may to have a seizure.  Had a witnessed seizure in the ED and was treated with Ativan and loaded with Keppra.  Had to be started on Keflex did fairly well overnight     Assessment/Plan:   Alcohol withdrawal seizure with complication ([place]) Started on the CIWA protocol had to be placed on Precedex 09/09/2021.  Then eventually improved.  Has been weaned off Precedex. Discontinue Ativan as he is able to take orals we will start him on Librium protocol. Continue thiamine and folate. Physical therapy has been consulted. Discontinue telemetry, out of bed to chair may shower consult physical therapy.  Hypokalemia Repleted orally now resolved.  To keep above 4.  Hepatic steatosis: Noted on CT.  Macrocytic anemia: Continue thiamine and folate. Likely due to alcohol abuse.  Essential hypertension: Continue hold Aldactone. Continue to use labetalol  GERD: Continue PPI.  Schizoaffective disorder: Continue Seroquel and Zoloft.  Sacral decubitus ulcer present on admission:   DVT prophylaxis: lovenox Family Communication:none Status is: Inpatient  Remains inpatient appropriate because: Alcohol withdrawal seizures.        Code Status:     Code Status Orders  (From admission, onward)           Start     Ordered   09/07/21 1215  Full code  Continuous        09/07/21 1214           Code Status History     Date Active Date Inactive Code Status Order ID Comments User Context   08/26/2021 0506 08/27/2021 1857 Full Code [id number]  Rayna Sexton, PA-C ED   07/16/2020 1752 08/06/2020 2031 Full Code [id number]  Iona Beard, MD ED   05/03/2020 0400 05/07/2020 1856 Full Code [id number]  Chotiner, Yevonne Aline, MD ED   10/05/2019 1645 10/07/2019 1845 Full Code [id number]  Asencion Noble, MD ED   03/16/2019 1933 03/20/2019 1518 Full Code [id number]  Shela Leff, MD ED   03/02/2019 0541 03/08/2019 1641 Full Code [id number]  Germain Osgood, PA-C ED   01/10/2019 2229 01/15/2019 1933 Full Code [id number]  Ivor Costa, MD ED   10/05/2018 0402 10/05/2018 1755 Full Code [id number]  Ward, Delice Bison, DO ED   06/02/2018 1914 06/08/2018 1626 Full Code [id number]  Suella Broad, FNP Inpatient   06/01/2018 0708 06/02/2018 1825 Full Code [id number]  Ripley Fraise, MD ED   02/22/2016 1928 02/23/2016 1510 Full Code [id number]  Patrecia Pour, NP Inpatient   10/10/2015 1408 10/11/2015 1436 Full Code [id number]  Leonard Schwartz, MD ED   09/30/2015 1656 10/04/2015 2154 Full Code [id number]  Debbe Odea, MD ED   06/29/2015 1426 06/30/2015 1902 Full Code [id number]  Kerrie Buffalo, NP Inpatient   06/28/2015 2339 06/29/2015 0928 Full Code [id number]  Antonietta Breach, PA-C ED   06/17/2015 1237 06/19/2015 1614 Full Code [id number]  Lurena Nida, NP Inpatient   06/16/2015 1633 06/17/2015 1237 Full Code [id number]  Merrily Pew, MD ED   05/13/2015 1353 05/14/2015 1700 Full Code [id number]  Niel Hummer, NP Inpatient   05/13/2015 0727 05/13/2015 1353 Full Code [id number]  Lacretia Leigh, MD ED   05/05/2015 1932 05/06/2015  [place] Full Code [id number]  Patrecia Pour, NP Inpatient   05/05/2015 1852 05/05/2015 1932 Full Code [id number]  Delfin Gant, NP Inpatient   05/05/2015 0233 05/05/2015 1852 Full Code [id number]  Alvina Chou, PA-C ED   04/23/2015 1814 04/25/2015 1735 Full Code [id number]  Junius Creamer, NP ED   04/08/2015 0737 04/09/2015 2242 Full Code [id number]  Dorie Rank, MD ED   02/28/2015 1530 03/01/2015 1708 Full Code [id number]  Benjamine Mola, [place] Inpatient   02/28/2015 0544 02/28/2015 1530 Full Code [id number]  Charlann Lange, PA-C ED   02/03/2015  1321 02/09/2015 2125 Full Code [id number]  Encarnacion Slates, NP Inpatient   02/03/2015 0111 02/03/2015 1321 Full Code [id number]  Antonietta Breach, PA-C ED   12/25/2014 1638 12/30/2014 1725 Full Code [id number]  Benjamine Mola, [place] Inpatient   12/25/2014 1357 12/25/2014 1638 Full Code [id number]  Benjamine Mola, [place] Inpatient   12/24/2014 0751 12/25/2014 0429 Full Code [id number]  Carlisle Cater, PA-C ED   11/29/2014 1743 12/06/2014 1859 Full Code [id number]  Patrecia Pour, NP Inpatient   11/28/2014 1142 11/29/2014 1743 Full Code [id number]  Margarita Mail, PA-C ED   11/03/2014 2146 11/06/2014 2004 Full Code [id number]  Deneise Lever, MD ED   10/24/2014 2142 10/30/2014 1814 Full Code [id number]  Laverle Hobby, PA-C Inpatient   10/19/2014 0618 10/24/2014 2142 Full Code [id number]  Toy Baker, MD ED   09/11/2014 2139 09/12/2014 1922 Full Code [id number]  Antonietta Breach, PA-C ED   07/06/2014 1728 07/07/2014 1707 Full Code [id number]  Clayton Bibles, PA-C ED   05/04/2014 1303 05/05/2014 1653 Full Code [id number]  Clarene Reamer, MD Inpatient   02/11/2014 1833 02/16/2014 1656 Full Code [id number]  Delfin Gant, NP Inpatient   02/09/2014 1519 02/11/2014 1833 Full Code [id number]  Benjamine Mola, [place] Inpatient   02/09/2014 1323 02/09/2014 1519 Full Code [id number]  Benjamine Mola, [place] ED   02/09/2014 0143 02/09/2014 1323 Full Code [id number]  Martie Lee, PA-C ED   01/05/2014 0906 01/05/2014 1703 Full Code [id number]  Akintayo, [place] Inpatient   12/29/2013 1951 12/29/2013 2301 Full Code [id number]  Malena Peer, NP ED   12/13/2013 0324 12/13/2013 1003 Full Code [id number]  Domenic Moras, PA-C ED   12/02/2011 2322 12/08/2011 2325 Full Code [id number]  Hoy Morn, MD ED   11/25/2011 0151 11/25/2011 1451 Full Code [id number]  Abran Richard, PA-C ED   10/30/2011 0012 10/30/2011 0810 Full Code [id number]  Alfonzo Feller, DO ED   06/30/2011 1826 07/16/2011 1418 Full Code [id number]  Paulla Fore, RN Inpatient         IV Access:    Peripheral IV   Procedures and diagnostic studies:   No results found.   Medical Consultants:   None.   Subjective:    [name] relates he feels better hungry tolerating his diet. Still feels a little bit shaky.  Objective:    Vitals:   09/11/21 0300 09/11/21 0350 09/11/21 0400 09/11/21 0800  BP: 112/68  115/72   Pulse: 82  92   Resp: (!) 22  (!) 22   Temp:  97.7 F (36.5 C)  98.8 F (37.1 C)  TempSrc:  Oral  Oral  SpO2: 95%  95%   Weight:      Height:       SpO2: 95 %   Intake/Output Summary (Last 24 hours) at 09/11/2021 0829 Last data filed at 09/11/2021 0422 Gross per 24 hour  Intake --  Output 2650 ml  Net -2650 ml   Filed Weights   09/07/21 0900 09/07/21 1500  Weight: 88.5 kg 94.7 kg    Exam: General exam: In no acute distress. Respiratory system: Good air movement and clear to auscultation. Cardiovascular system: S1 & S2 heard, RRR. No JVD.  Gastrointestinal system: Abdomen is nondistended, soft and nontender.  Extremities: No pedal edema. Skin: No rashes, lesions or ulcers Psychiatry: Judgement and insight appear normal. Mood & affect appropriate.    Data Reviewed:    Labs: Basic Metabolic Panel: Recent Labs  Lab 09/07/21 0557 09/07/21 1501 09/08/21 0246 09/08/21 0729 09/08/21 1000 09/09/21 0303 09/10/21 0258 09/11/21 0250  NA 134* 135 136  --  136 134* 134* 133*  K 2.5* 3.0* 2.9*  --  3.4* 3.8 4.1 3.6  CL 90* 97* 99  --  102 104 107 106  CO2 24 30 28   --  26 22 17* 20*  GLUCOSE 172* 120* 103*  --  110* 94 82 126*  BUN 7 8 9   --  10 11 11 14   CREATININE 0.99 0.98 0.91  --  0.94 0.95 0.86 0.96  CALCIUM 9.0 8.6* 8.3*  --  8.1* 8.5* 8.6* 8.6*  MG 1.4* 2.2  --  2.0  --  1.7 1.8 1.5*  PHOS 2.1* 2.2*  --   --   --   --   --   --    GFR Estimated Creatinine Clearance: 112.3 mL/min (by C-G formula based on SCr of 0.96 mg/dL). Liver Function Tests: Recent Labs  Lab 09/07/21 0557 09/07/21 1501 09/08/21 0246  09/09/21 0303 09/10/21 0258 09/11/21 0250  AST 155*  --  186* 283* 180* 136*  ALT 69*  --  63* 98* 101* 94*  ALKPHOS 197*  --  160* 161* 161* 155*  BILITOT 1.7*  --  2.3* 2.0* 1.7* 1.3*  PROT 8.1  --  6.3* 6.3* 6.5 6.2*  ALBUMIN 4.7 4.1 3.7 3.5 3.2* 3.1*   No results for input(s): LIPASE, AMYLASE in the last 168 hours. No results for input(s): AMMONIA in the last 168 hours. Coagulation profile Recent Labs  Lab 09/08/21 1000  INR 1.1   COVID-19 Labs  No results for input(s): DDIMER, FERRITIN, LDH, CRP in the last 72 hours.  Lab Results  Component Value Date   SARSCOV2NAA NEGATIVE 09/07/2021   [place] NEGATIVE 08/26/2021   [place] NEGATIVE 08/05/2020   [place] NEGATIVE 07/16/2020    CBC: Recent Labs  Lab 09/07/21 0557 09/08/21 0246 09/09/21 0303 09/10/21 0258 09/11/21 0250  WBC 8.3 9.1 9.3 7.8 7.7  NEUTROABS  --   --  6.6 5.3 4.5  HGB 14.9 12.1* 11.4* 12.9* 11.5*  HCT 43.4 36.1* 34.3* 38.3* 34.0*  MCV 96.4 100.6* 101.5* 100.8* 101.5*  PLT 195 148* 132* 142* 174   Cardiac Enzymes: No results for input(s): CKTOTAL, CKMB, CKMBINDEX, TROPONINI in the last 168 hours. BNP (last 3 results) No results for input(s): PROBNP in the last 8760 hours. CBG: Recent Labs  Lab 09/08/21 0741  GLUCAP 107*   D-Dimer: No results for input(s): DDIMER in the last 72 hours. Hgb A1c: No results for input(s): HGBA1C in the last 72 hours. Lipid Profile: No results for input(s): CHOL, HDL, LDLCALC, TRIG, CHOLHDL, LDLDIRECT in the last 72 hours. Thyroid function studies: No results for input(s): TSH, T4TOTAL, T3FREE, THYROIDAB in the last 72 hours.  Invalid input(s): FREET3 Anemia work up: Recent Labs    09/08/21 1000  VITAMINB12 575  FOLATE 8.0   Sepsis Labs: Recent Labs  Lab 09/08/21 0246 09/09/21 0303 09/10/21 0258 09/11/21 0250  WBC 9.1 9.3 7.8 7.7   Microbiology Recent Results (from the past 240 hour(s))  Resp Panel by RT-PCR (Flu A&B, Covid)  Nasopharyngeal Swab     Status: None   Collection Time: 09/07/21  7:26 AM   Specimen: Nasopharyngeal Swab; Nasopharyngeal(NP) swabs in vial transport medium  Result Value Ref Range Status   SARS Coronavirus 2 by RT PCR NEGATIVE NEGATIVE Final    Comment: (NOTE) SARS-CoV-2 target nucleic acids are NOT DETECTED.  The SARS-CoV-2 RNA is generally detectable in upper respiratory specimens during the acute phase of infection. The lowest concentration of SARS-CoV-2 viral copies this assay can detect is 138 copies/mL. A negative result does not preclude SARS-Cov-2 infection and should not be used as the sole basis for treatment or other patient management decisions. A negative result may occur with  improper specimen collection/handling, submission of specimen other than nasopharyngeal swab, presence of viral mutation(s) within the areas targeted by this assay, and inadequate number of viral copies(<138 copies/mL). A negative result must be combined with clinical observations, patient history, and epidemiological information. The expected result is Negative.  Fact Sheet for Patients:  EntrepreneurPulse.com.au  Fact Sheet for Healthcare Providers:  IncredibleEmployment.be  This test is no t yet approved or cleared by the [place] FDA and  has been authorized for detection and/or diagnosis of SARS-CoV-2 by FDA under an Emergency Use Authorization (EUA). This EUA will remain  in effect (meaning this test can be used) for the duration of the COVID-19 declaration under Section 564(b)(1) of the Act, 21 U.S.C.section 360bbb-3(b)(1), unless the authorization is terminated  or revoked sooner.       Influenza A by PCR NEGATIVE NEGATIVE Final   Influenza B by PCR NEGATIVE NEGATIVE Final    Comment: (NOTE) The Xpert Xpress SARS-CoV-2/FLU/RSV plus assay is intended as an aid in the diagnosis of influenza from Nasopharyngeal swab specimens and should not be  used as a sole basis for treatment. Nasal washings and aspirates are unacceptable for Xpert Xpress SARS-CoV-2/FLU/RSV testing.  Fact Sheet for Patients: EntrepreneurPulse.com.au  Fact Sheet for Healthcare Providers: IncredibleEmployment.be  This test is not yet approved or cleared by the [place] FDA and has been authorized for detection and/or diagnosis of SARS-CoV-2 by FDA under an Emergency Use Authorization (EUA). This EUA will remain in effect (meaning this test can be used) for the duration of the COVID-19 declaration under Section 564(b)(1) of the Act, 21 U.S.C. section 360bbb-3(b)(1), unless the authorization is terminated or revoked.  Performed at [facility], [place] [address], [place], [place] [address]   MRSA Next Gen by PCR, Nasal     Status: None   Collection Time: 09/07/21  1:52 PM   Specimen: Nasal Mucosa; Nasal Swab  Result Value Ref Range Status   MRSA by PCR Next Gen NOT DETECTED NOT DETECTED Final    Comment: (NOTE) The GeneXpert MRSA Assay (FDA approved for NASAL specimens only), is one component of a comprehensive MRSA colonization surveillance program. It is not intended to diagnose MRSA infection nor to guide or monitor treatment for MRSA infections. Test performance is not FDA approved in patients less than 46 years old. Performed at [facility], [place] [address], [place], [place] [address]      Medications:    Chlorhexidine Gluconate Cloth  6 each Topical Daily   folic acid  1 mg Oral Daily   gabapentin  300 mg Oral TID   LORazepam  0-4 mg Intravenous Q8H   mouth rinse  15 mL Mouth Rinse BID   multivitamin with minerals  1 tablet Oral Daily   nicotine  21 mg Transdermal Daily   pantoprazole  40 mg Oral Daily   QUEtiapine  200 mg Oral QHS   sertraline  25 mg Oral Daily   thiamine  100 mg Oral Daily   Or   thiamine  100 mg Intravenous Daily   Continuous Infusions:   sodium chloride 100 mL/hr at 09/10/21 0555   sodium chloride Stopped (09/09/21 1259)   dexmedetomidine (PRECEDEX) IV infusion 0.5 mcg/kg/hr (09/10/21 0555)   levETIRAcetam Stopped (09/10/21 2303)      LOS: 4 days   Charlynne Cousins  [facility]  09/11/2021, 8:29 AM

## 2021-09-12 DIAGNOSIS — D539 Nutritional anemia, unspecified: Secondary | ICD-10-CM

## 2021-09-12 LAB — CBC WITH DIFFERENTIAL/PLATELET
Abs Immature Granulocytes: 0.06 10*3/uL (ref 0.00–0.07)
Basophils Absolute: 0 10*3/uL (ref 0.0–0.1)
Basophils Relative: 0 %
Eosinophils Absolute: 0.1 10*3/uL (ref 0.0–0.5)
Eosinophils Relative: 1 %
HCT: 37.9 % — ABNORMAL LOW (ref 39.0–52.0)
Hemoglobin: 12.2 g/dL — ABNORMAL LOW (ref 13.0–17.0)
Immature Granulocytes: 1 %
Lymphocytes Relative: 30 %
Lymphs Abs: 2.3 10*3/uL (ref 0.7–4.0)
MCH: 33.4 pg (ref 26.0–34.0)
MCHC: 32.2 g/dL (ref 30.0–36.0)
MCV: 103.8 fL — ABNORMAL HIGH (ref 80.0–100.0)
Monocytes Absolute: 1.2 10*3/uL — ABNORMAL HIGH (ref 0.1–1.0)
Monocytes Relative: 16 %
Neutro Abs: 3.8 10*3/uL (ref 1.7–7.7)
Neutrophils Relative %: 52 %
Platelets: 191 10*3/uL (ref 150–400)
RBC: 3.65 MIL/uL — ABNORMAL LOW (ref 4.22–5.81)
RDW: 16.8 % — ABNORMAL HIGH (ref 11.5–15.5)
WBC: 7.4 10*3/uL (ref 4.0–10.5)
nRBC: 0 % (ref 0.0–0.2)

## 2021-09-12 LAB — RENAL FUNCTION PANEL
Albumin: 3.2 g/dL — ABNORMAL LOW (ref 3.5–5.0)
Anion gap: 9 (ref 5–15)
BUN: 14 mg/dL (ref 6–20)
CO2: 19 mmol/L — ABNORMAL LOW (ref 22–32)
Calcium: 8.7 mg/dL — ABNORMAL LOW (ref 8.9–10.3)
Chloride: 104 mmol/L (ref 98–111)
Creatinine, Ser: 0.89 mg/dL (ref 0.61–1.24)
GFR, Estimated: >60 mL/min (ref >60)
Glucose, Bld: 108 mg/dL — ABNORMAL HIGH (ref 70–99)
Phosphorus: 4.2 mg/dL (ref 2.5–4.6)
Potassium: 3.7 mmol/L (ref 3.5–5.1)
Sodium: 132 mmol/L — ABNORMAL LOW (ref 135–145)

## 2021-09-12 LAB — MAGNESIUM: Magnesium: 1.6 mg/dL — ABNORMAL LOW (ref 1.7–2.4)

## 2021-09-12 MED ORDER — METOPROLOL TARTRATE 25 MG PO TABS
25.0000 mg | ORAL_TABLET | Freq: Two times a day (BID) | ORAL | Status: DC
  Administered 2021-09-12 – 2021-09-23 (×21): 25 mg via ORAL
  Filled 2021-09-12 (×23): qty 1

## 2021-09-12 MED ORDER — POTASSIUM CHLORIDE 20 MEQ PO PACK
40.0000 meq | PACK | Freq: Two times a day (BID) | ORAL | Status: AC
  Administered 2021-09-12: 40 meq via ORAL
  Filled 2021-09-12: qty 2

## 2021-09-12 MED ORDER — MAGNESIUM OXIDE -MG SUPPLEMENT 400 (240 MG) MG PO TABS
800.0000 mg | ORAL_TABLET | Freq: Two times a day (BID) | ORAL | Status: AC
  Administered 2021-09-12 – 2021-09-15 (×8): 800 mg via ORAL
  Filled 2021-09-12 (×9): qty 2

## 2021-09-12 MED ORDER — SODIUM CHLORIDE 0.9 % IV SOLN: INTRAVENOUS | Status: AC

## 2021-09-12 NOTE — Progress Notes (Signed)
[facility] PROGRESS NOTE    Progress Note  Guiseppe Flanagan  PXT:[id number] DOB: [birth year] DOA: 09/07/2021 PCP: [name], MD     Brief Narrative:   [name] is an 51 y.o. male past medical history of alcohol abuse and alcoholic cirrhosis, schizoaffective disorder with seizures who comes in with withdrawal symptoms thinking he may to have a seizure.  Had a witnessed seizure in the ED and was treated with Ativan and loaded with Keppra.  Had to be started on Precedex, did fairly well overnight.  Has been slowly improving off Precedex started on Librium orally which she has been tolerating. Tolerating his diet.   Assessment/Plan:   Alcohol withdrawal seizure with complication (HCC) CIWA score is 3 he is currently on Librium Continue thiamine and folate.  Awaiting physical therapy evaluation. Start low-dose metoprolol due to sinus tachycardia, his sinus tachycardia is due to withdrawals. Awaiting physical therapy evaluation.  Hypokalemia Potassium still 3.7, replete orally mag 1.6, will continue oral repletion of magnesium.  Hepatic steatosis: Noted on CT.  Macrocytic anemia: Continue thiamine and folate. Likely due to alcohol abuse.  Essential hypertension: Continue hold Aldactone. Continue to use labetalol  GERD: Continue PPI.  Schizoaffective disorder: Continue Seroquel and Zoloft.  Sacral decubitus ulcer present on admission:   DVT prophylaxis: lovenox Family Communication:none Status is: Inpatient  Remains inpatient appropriate because: Alcohol withdrawal seizures.        Code Status:     Code Status Orders  (From admission, onward)           Start     Ordered   09/07/21 1215  Full code  Continuous        09/07/21 1214           Code Status History     Date Active Date Inactive Code Status Order ID Comments User Context   08/26/2021 0506 08/27/2021 1857 Full Code [id number]  Rayna Sexton, PA-C ED   07/16/2020 1752  08/06/2020 2031 Full Code [id number]  Iona Beard, MD ED   05/03/2020 0400 05/07/2020 1856 Full Code [id number]  Chotiner, Yevonne Aline, MD ED   10/05/2019 1645 10/07/2019 1845 Full Code [id number]  Asencion Noble, MD ED   03/16/2019 1933 03/20/2019 1518 Full Code [id number]  Shela Leff, MD ED   03/02/2019 0541 03/08/2019 1641 Full Code [id number]  Germain Osgood, PA-C ED   01/10/2019 2229 01/15/2019 1933 Full Code [id number]  Ivor Costa, MD ED   10/05/2018 0402 10/05/2018 1755 Full Code [id number]  Ward, Delice Bison, DO ED   06/02/2018 1914 06/08/2018 1626 Full Code [id number]  Suella Broad, FNP Inpatient   06/01/2018 0708 06/02/2018 1825 Full Code [id number]  Ripley Fraise, MD ED   02/22/2016 1928 02/23/2016 1510 Full Code [id number]  Patrecia Pour, NP Inpatient   10/10/2015 1408 10/11/2015 1436 Full Code [id number]  Leonard Schwartz, MD ED   09/30/2015 1656 10/04/2015 2154 Full Code [id number]  Debbe Odea, MD ED   06/29/2015 1426 06/30/2015 1902 Full Code [id number]  Kerrie Buffalo, NP Inpatient   06/28/2015 2339 06/29/2015 0928 Full Code [id number]  Antonietta Breach, PA-C ED   06/17/2015 1237 06/19/2015 1614 Full Code [id number]  Lurena Nida, NP Inpatient   06/16/2015 1633 06/17/2015 1237 Full Code [id number]  Merrily Pew, MD ED   05/13/2015 1353 05/14/2015 1700 Full Code [id number]  Niel Hummer, NP Inpatient   05/13/2015 0727 05/13/2015 1353 Full Code [id number]  Lacretia Leigh, MD ED   05/05/2015 1932 05/06/2015 1552 Full  Code [id number]  Patrecia Pour, NP Inpatient   05/05/2015 1852 05/05/2015 1932 Full Code [id number]  Delfin Gant, NP Inpatient   05/05/2015 0233 05/05/2015 1852 Full Code [id number]  Alvina Chou, PA-C ED   04/23/2015 1814 04/25/2015 1735 Full Code [id number]  Junius Creamer, NP ED   04/08/2015 0737 04/09/2015 2242 Full Code [id number]  Dorie Rank, MD ED   02/28/2015 1530 03/01/2015 1708 Full Code [id number]  Benjamine Mola, [place] Inpatient   02/28/2015 0544 02/28/2015 1530 Full Code  [id number]  Charlann Lange, PA-C ED   02/03/2015 1321 02/09/2015 2125 Full Code [id number]  Encarnacion Slates, NP Inpatient   02/03/2015 0111 02/03/2015 1321 Full Code [id number]  Antonietta Breach, PA-C ED   12/25/2014 1638 12/30/2014 1725 Full Code [id number]  Benjamine Mola, [place] Inpatient   12/25/2014 1357 12/25/2014 1638 Full Code [id number]  Benjamine Mola, [place] Inpatient   12/24/2014 0751 12/25/2014 0429 Full Code [id number]  Carlisle Cater, PA-C ED   11/29/2014 1743 12/06/2014 1859 Full Code [id number]  Patrecia Pour, NP Inpatient   11/28/2014 1142 11/29/2014 1743 Full Code [id number]  Margarita Mail, PA-C ED   11/03/2014 2146 11/06/2014 2004 Full Code [id number]  Deneise Lever, MD ED   10/24/2014 2142 10/30/2014 1814 Full Code [id number]  Laverle Hobby, PA-C Inpatient   10/19/2014 0618 10/24/2014 2142 Full Code [id number]  Toy Baker, MD ED   09/11/2014 2139 09/12/2014 1922 Full Code [id number]  Antonietta Breach, PA-C ED   07/06/2014 1728 07/07/2014 1707 Full Code [id number]  Clayton Bibles, PA-C ED   05/04/2014 1303 05/05/2014 1653 Full Code [id number]  Clarene Reamer, MD Inpatient   02/11/2014 1833 02/16/2014 1656 Full Code [id number]  Delfin Gant, NP Inpatient   02/09/2014 1519 02/11/2014 1833 Full Code [id number]  Benjamine Mola, [place] Inpatient   02/09/2014 1323 02/09/2014 1519 Full Code [id number]  Benjamine Mola, [place] ED   02/09/2014 0143 02/09/2014 1323 Full Code [id number]  Martie Lee, PA-C ED   01/05/2014 0906 01/05/2014 1703 Full Code [id number]  Akintayo, Mojeed Inpatient   12/29/2013 1951 12/29/2013 2301 Full Code [id number]  Malena Peer, NP ED   12/13/2013 0324 12/13/2013 1003 Full Code [id number]  Domenic Moras, PA-C ED   12/02/2011 2322 12/08/2011 2325 Full Code [id number]  Hoy Morn, MD ED   11/25/2011 0151 11/25/2011 1451 Full Code [id number]  Abran Richard, PA-C ED   10/30/2011 0012 10/30/2011 0810 Full Code [id number]  Alfonzo Feller, DO ED   06/30/2011 1826 07/16/2011 1418 Full Code [id number]  Paulla Fore, RN Inpatient         IV Access:   Peripheral IV   Procedures and diagnostic studies:   No results found.   Medical Consultants:   None.   Subjective:    Cadin Luka tolerating his diet, he relates his legs are heavy and he weak Objective:    Vitals:   09/12/21 0000 09/12/21 0100 09/12/21 0300 09/12/21 0410  BP: 105/76 107/74 127/71 129/74  Pulse: (!) 104 (!) 102 (!) 113 (!) 124  Resp: (!) 23 19 (!) 21 16  Temp:   98.2 F (36.8 C)   TempSrc:   Oral   SpO2: 94% 93% 95% 96%  Weight:      Height:       SpO2: 96 %   Intake/Output Summary (Last 24 hours) at 09/12/2021 7517 Last data filed at 09/11/2021 1120 Gross per 24 hour  Intake 883.01 ml  Output 450 ml  Net 433.01 ml    Filed Weights   09/07/21 0900 09/07/21 1500  Weight: 88.5 kg 94.7 kg    Exam: General exam: In no acute distress. Respiratory system: Good air movement and clear to auscultation. Cardiovascular system: S1 & S2 heard, RRR. No JVD. Gastrointestinal system: Abdomen is nondistended, soft and nontender.  Extremities: No pedal edema. Skin: No rashes, lesions or ulcers Psychiatry: Judgement and insight appear normal. Mood & affect appropriate.  Data Reviewed:    Labs: Basic Metabolic Panel: Recent Labs  Lab 09/07/21 0557 09/07/21 1501 09/08/21 0246 09/08/21 0729 09/08/21 1000 09/09/21 0303 09/10/21 0258 09/11/21 0250 09/12/21 0245  NA 134* 135   < >  --  136 134* 134* 133* 132*  K 2.5* 3.0*   < >  --  3.4* 3.8 4.1 3.6 3.7  CL 90* 97*   < >  --  102 104 107 106 104  CO2 24 30   < >  --  26 22 17* 20* 19*  GLUCOSE 172* 120*   < >  --  110* 94 82 126* 108*  BUN 7 8   < >  --  10 11 11 14 14   CREATININE 0.99 0.98   < >  --  0.94 0.95 0.86 0.96 0.89  CALCIUM 9.0 8.6*   < >  --  8.1* 8.5* 8.6* 8.6* 8.7*  MG 1.4* 2.2  --  2.0  --  1.7 1.8 1.5* 1.6*  PHOS 2.1* 2.2*  --   --   --   --   --   --  4.2   < > = values in this interval not displayed.     GFR Estimated Creatinine Clearance: 121.2 mL/min (by C-G formula based on SCr of 0.89 mg/dL). Liver Function Tests: Recent Labs  Lab 09/07/21 0557 09/07/21 1501 09/08/21 0246 09/09/21 0303 09/10/21 0258 09/11/21 0250 09/12/21 0245  AST 155*  --  186* 283* 180* 136*  --   ALT 69*  --  63* 98* 101* 94*  --   ALKPHOS 197*  --  160* 161* 161* 155*  --   BILITOT 1.7*  --  2.3* 2.0* 1.7* 1.3*  --   PROT 8.1  --  6.3* 6.3* 6.5 6.2*  --   ALBUMIN 4.7   < > 3.7 3.5 3.2* 3.1* 3.2*   < > = values in this interval not displayed.    No results for input(s): LIPASE, AMYLASE in the last 168 hours. No results for input(s): AMMONIA in the last 168 hours. Coagulation profile Recent Labs  Lab 09/08/21 1000  INR 1.1    COVID-19 Labs  No results for input(s): DDIMER, FERRITIN, LDH, CRP in the last 72 hours.  Lab Results  Component Value Date   SARSCOV2NAA NEGATIVE 09/07/2021   [place] NEGATIVE 08/26/2021   [place] NEGATIVE 08/05/2020   [place] NEGATIVE 07/16/2020    CBC: Recent Labs  Lab 09/08/21 0246 09/09/21 0303 09/10/21 0258 09/11/21 0250 09/12/21 0245  WBC 9.1 9.3 7.8 7.7 7.4  NEUTROABS  --  6.6 5.3 4.5 3.8  HGB 12.1* 11.4* 12.9* 11.5* 12.2*  HCT 36.1* 34.3* 38.3* 34.0* 37.9*  MCV 100.6* 101.5* 100.8* 101.5* 103.8*  PLT 148* 132* 142* 174 191    Cardiac Enzymes: No results for input(s): CKTOTAL, CKMB, CKMBINDEX, TROPONINI in the last 168 hours. BNP (last 3 results) No results for input(s): PROBNP in the last 8760 hours. CBG: Recent Labs  Lab 09/08/21 [phone]  GLUCAP 107*    D-Dimer: No results for input(s): DDIMER in the last 72 hours. Hgb A1c: No results for input(s): HGBA1C in the last 72 hours. Lipid Profile: No results for input(s): CHOL, HDL, LDLCALC, TRIG, CHOLHDL, LDLDIRECT in the last 72 hours. Thyroid function studies: No results for input(s): TSH, T4TOTAL, T3FREE, THYROIDAB in the last 72 hours.  Invalid input(s): FREET3 Anemia  work up: No results for input(s): VITAMINB12, FOLATE, FERRITIN, TIBC, IRON, RETICCTPCT in the last 72 hours.  Sepsis Labs: Recent Labs  Lab 09/09/21 0303 09/10/21 0258 09/11/21 0250 09/12/21 0245  WBC 9.3 7.8 7.7 7.4    Microbiology Recent Results (from the past 240 hour(s))  Resp Panel by RT-PCR (Flu A&B, Covid) Nasopharyngeal Swab     Status: None   Collection Time: 09/07/21  7:26 AM   Specimen: Nasopharyngeal Swab; Nasopharyngeal(NP) swabs in vial transport medium  Result Value Ref Range Status   SARS Coronavirus 2 by RT PCR NEGATIVE NEGATIVE Final    Comment: (NOTE) SARS-CoV-2 target nucleic acids are NOT DETECTED.  The SARS-CoV-2 RNA is generally detectable in upper respiratory specimens during the acute phase of infection. The lowest concentration of SARS-CoV-2 viral copies this assay can detect is 138 copies/mL. A negative result does not preclude SARS-Cov-2 infection and should not be used as the sole basis for treatment or other patient management decisions. A negative result may occur with  improper specimen collection/handling, submission of specimen other than nasopharyngeal swab, presence of viral mutation(s) within the areas targeted by this assay, and inadequate number of viral copies(<138 copies/mL). A negative result must be combined with clinical observations, patient history, and epidemiological information. The expected result is Negative.  Fact Sheet for Patients:  EntrepreneurPulse.com.au  Fact Sheet for Healthcare Providers:  IncredibleEmployment.be  This test is no t yet approved or cleared by the [place] FDA and  has been authorized for detection and/or diagnosis of SARS-CoV-2 by FDA under an Emergency Use Authorization (EUA). This EUA will remain  in effect (meaning this test can be used) for the duration of the COVID-19 declaration under Section 564(b)(1) of the Act, 21 U.S.C.section 360bbb-3(b)(1),  unless the authorization is terminated  or revoked sooner.       Influenza A by PCR NEGATIVE NEGATIVE Final   Influenza B by PCR NEGATIVE NEGATIVE Final    Comment: (NOTE) The Xpert Xpress SARS-CoV-2/FLU/RSV plus assay is intended as an aid in the diagnosis of influenza from Nasopharyngeal swab specimens and should not be used as a sole basis for treatment. Nasal washings and aspirates are unacceptable for Xpert Xpress SARS-CoV-2/FLU/RSV testing.  Fact Sheet for Patients: EntrepreneurPulse.com.au  Fact Sheet for Healthcare Providers: IncredibleEmployment.be  This test is not yet approved or cleared by the [place] FDA and has been authorized for detection and/or diagnosis of SARS-CoV-2 by FDA under an Emergency Use Authorization (EUA). This EUA will remain in effect (meaning this test can be used) for the duration of the COVID-19 declaration under Section 564(b)(1) of the Act, 21 U.S.C. section 360bbb-3(b)(1), unless the authorization is terminated or revoked.  Performed at [facility], [place] [address], [place], [place] [address]   MRSA Next Gen by PCR, Nasal     Status: None   Collection Time: 09/07/21  1:52 PM   Specimen: Nasal Mucosa; Nasal Swab  Result Value Ref Range Status   MRSA by PCR Next Gen NOT DETECTED NOT DETECTED Final    Comment: (NOTE) The GeneXpert MRSA Assay (FDA approved for  NASAL specimens only), is one component of a comprehensive MRSA colonization surveillance program. It is not intended to diagnose MRSA infection nor to guide or monitor treatment for MRSA infections. Test performance is not FDA approved in patients less than 24 years old. Performed at [facility], [place] [address] [place], [place] [address]      Medications:    chlordiazePOXIDE  25 mg Oral QID   Followed by   chlordiazePOXIDE  25 mg Oral TID   Followed by   Derrill Memo ON 09/13/2021] chlordiazePOXIDE   25 mg Oral BH-qamhs   Followed by   Derrill Memo ON 09/14/2021] chlordiazePOXIDE  25 mg Oral Daily   Chlorhexidine Gluconate Cloth  6 each Topical Daily   folic acid  1 mg Oral Daily   gabapentin  300 mg Oral TID   levETIRAcetam  500 mg Oral BID   mouth rinse  15 mL Mouth Rinse BID   metoprolol tartrate  25 mg Oral BID   multivitamin with minerals  1 tablet Oral Daily   nicotine  21 mg Transdermal Daily   pantoprazole  40 mg Oral Daily   QUEtiapine  200 mg Oral QHS   sertraline  25 mg Oral Daily   thiamine  100 mg Oral Daily   Continuous Infusions:  sodium chloride        LOS: 5 days   Charlynne Cousins  [facility]  09/12/2021, 7:02 AM

## 2021-09-12 NOTE — Evaluation (Signed)
Occupational Therapy Evaluation Patient Details Name: [name] MRN: [id number] DOB: Oct 03, 1970 Today's Date: 09/12/2021   History of Present Illness [name] is an 51 y.o. male past medical history of alcohol abuse and alcoholic cirrhosis, schizoaffective disorder with seizures who comes in with withdrawal symptoms thinking he may to have a seizure.  Had a witnessed seizure in the ED and was treated with Ativan and loaded with Keppra.   Clinical Impression   [name] is a 50 year old man who presents with generalized weakness, decreased activity tolerance, impaired balance and pain resulting in a sudden decline in functional abilities. Patient supervision to transfer to side of bed and min assist for standing and take steps to recliner. His legs are shaky and he is unsteady with poor endurance. He complains of generalized body/joint pains. He is needing increased assistance for ADLs and limited to setup and seated position. Patient will benefit from skilled OT services while in hospital to improve deficits and learn compensatory strategies as needed in order to return to PLOF.  Currently recommend short term rehab at discharge. If he progresses while in hospital will update POC.     Recommendations for follow up therapy are one component of a multi-disciplinary discharge planning process, led by the attending physician.  Recommendations may be updated based on patient status, additional functional criteria and insurance authorization.   Follow Up Recommendations  Skilled nursing-short term rehab (<3 hours/day)    Assistance Recommended at Discharge Frequent or constant Supervision/Assistance  Patient can return home with the following A little help with walking and/or transfers;A little help with bathing/dressing/bathroom;Assistance with cooking/housework    Functional Status Assessment  Patient has had a recent decline in their functional status and demonstrates the  ability to make significant improvements in function in a reasonable and predictable amount of time.  Equipment Recommendations  None recommended by OT    Recommendations for Other Services       Precautions / Restrictions Precautions Precautions: Fall Restrictions Weight Bearing Restrictions: No      Mobility Bed Mobility                    Transfers                          Balance Overall balance assessment: Needs assistance Sitting-balance support: No upper extremity supported, Feet supported Sitting balance-Leahy Scale: Fair     Standing balance support: Reliant on assistive device for balance Standing balance-Leahy Scale: Fair                             ADL either performed or assessed with clinical judgement   ADL Overall ADL's : Needs assistance/impaired Eating/Feeding: Independent   Grooming: Set up;Sitting   Upper Body Bathing: Set up;Sitting   Lower Body Bathing: Minimal assistance;Sit to/from stand   Upper Body Dressing : Set up;Sitting   Lower Body Dressing: Moderate assistance;Sit to/from stand   Toilet Transfer: Minimal assistance;BSC/3in1;Rolling walker (2 wheels)   Toileting- Clothing Manipulation and Hygiene: Sit to/from stand;Moderate assistance       Functional mobility during ADLs: Minimal assistance;Rolling walker (2 wheels) General ADL Comments: overall min assist for ambulation though he is unsteady with wobbly knees and poor activity tolerance.     Vision Patient Visual Report: No change from baseline       Perception     Praxis  Pertinent Vitals/Pain Pain Assessment Pain Assessment: Faces Faces Pain Scale: Hurts little more Pain Location: generalized Pain Descriptors / Indicators: Grimacing Pain Intervention(s): Limited activity within patient's tolerance     Hand Dominance Right   Extremity/Trunk Assessment Upper Extremity Assessment Upper Extremity Assessment: RUE  deficits/detail;LUE deficits/detail RUE Deficits / Details: WFL ROM, shoulder strength 4-/5 otherwise 5/5 strench, middle digit has trigger fiinger RUE Sensation:  (reports intermittent numbness/tingling in fingers) RUE Coordination: WNL LUE Deficits / Details: WFL ROM, 4-/5 shoulder strength otherwise 5/5 strength LUE Sensation: WNL LUE Coordination: WNL   Lower Extremity Assessment Lower Extremity Assessment: Defer to PT evaluation   Cervical / Trunk Assessment Cervical / Trunk Assessment: Kyphotic   Communication Communication Communication: No difficulties   Cognition Arousal/Alertness: Awake/alert Behavior During Therapy: WFL for tasks assessed/performed Overall Cognitive Status: Within Functional Limits for tasks assessed                                       General Comments       Exercises     Shoulder Instructions      Home Living Family/patient expects to be discharged to:: Unsure Living Arrangements: Alone Available Help at Discharge: Family;Available PRN/intermittently Type of Home: House Home Access: Stairs to enter [employer] of Steps: 2 Entrance Stairs-Rails:  (says he has a rail unsure of which side) Home Layout: One level     Bathroom Shower/Tub: Teacher, early years/pre: Standard     Home Equipment: Conservation officer, nature (2 wheels)          Prior Functioning/Environment Prior Level of Function : Independent/Modified Independent             Mobility Comments: reports typically doesn't use a device. Reports hx of TBI that effects his equilibrium ADLs Comments: independent        OT Problem List: Decreased strength;Decreased activity tolerance;Impaired balance (sitting and/or standing);Pain;Decreased knowledge of use of DME or AE      OT Treatment/Interventions: Self-care/ADL training;Therapeutic exercise;DME and/or AE instruction;Therapeutic activities;Balance training;Patient/family education    OT  Goals(Current goals can be found in the care plan section) Acute Rehab OT Goals Patient Stated Goal: to walk to bathroom OT Goal Formulation: With patient Time For Goal Achievement: 09/26/21 Potential to Achieve Goals: Good  OT Frequency: Min 2X/week    Co-evaluation              AM-PAC OT "6 Clicks" Daily Activity     Outcome Measure Help from another person eating meals?: A Little Help from another person taking care of personal grooming?: A Little Help from another person toileting, which includes using toliet, bedpan, or urinal?: A Lot Help from another person bathing (including washing, rinsing, drying)?: A Lot Help from another person to put on and taking off regular upper body clothing?: A Little Help from another person to put on and taking off regular lower body clothing?: A Lot 6 Click Score: 15   End of Session Equipment Utilized During Treatment: Rolling walker (2 wheels);Gait belt Nurse Communication: Mobility status  Activity Tolerance: Patient tolerated treatment well Patient left: in chair;with call bell/phone within reach  OT Visit Diagnosis: Unsteadiness on feet (R26.81);Muscle weakness (generalized) (M62.81)                Time: 1740-8144 OT Time Calculation (min): 17 min Charges:  OT General Charges $OT Visit: 1 Visit OT Evaluation $OT Eval  Low Complexity: 1 Low  [name], OTR/L [place]  Office [phone] Pager: [place] 09/12/2021, 4:38 PM

## 2021-09-12 NOTE — Evaluation (Signed)
Physical Therapy Evaluation Patient Details Name: [name] MRN: [id number] DOB: Sep 10, 1970 Today's Date: 09/12/2021  History of Present Illness  Khris Jansson is an 51 y.o. male past medical history of alcohol abuse and alcoholic cirrhosis, schizoaffective disorder with seizures who comes in with withdrawal symptoms thinking he may to have a seizure.  Had a witnessed seizure in the ED and was treated with Ativan and loaded with Keppra.  Clinical Impression   The patient reports soreness/pain in legs with mobility.note  the  Legs with ataxia when standing. Shuffle steps to recliner with  RW and min/mod  assistance for balance.  Patient resides alone. Will benefit from post acute rehab. Has been in past.  Pt admitted with above diagnosis.  Pt currently with functional limitations due to the deficits listed below (see PT Problem List). Pt will benefit from skilled PT to increase their independence and safety with mobility to allow discharge to the venue listed below.           Recommendations for follow up therapy are one component of a multi-disciplinary discharge planning process, led by the attending physician.  Recommendations may be updated based on patient status, additional functional criteria and insurance authorization.  Follow Up Recommendations Skilled nursing-short term rehab (<3 hours/day)    Assistance Recommended at Discharge    Patient can return home with the following  A little help with walking and/or transfers;Assistance with cooking/housework;Direct supervision/assist for medications management;Assist for transportation;Help with stairs or ramp for entrance    Equipment Recommendations None recommended by PT  Recommendations for Other Services       Functional Status Assessment Patient has had a recent decline in their functional status and demonstrates the ability to make significant improvements in function in a reasonable and predictable amount of time.      Precautions / Restrictions Precautions Precautions: Fall Precaution Comments: ataXIC Restrictions Weight Bearing Restrictions: No      Mobility  Bed Mobility Overal bed mobility: Needs Assistance Bed Mobility: Supine to Sit     Supine to sit: Supervision     General bed mobility comments: for safety    Transfers Overall transfer level: Needs assistance Equipment used: Rolling walker (2 wheels) Transfers: Sit to/from Stand, Bed to chair/wheelchair/BSC Sit to Stand: Min assist, +2 safety/equipment, From elevated surface   Step pivot transfers: Min assist, +2 safety/equipment       General transfer comment: noted feet/legs shaking, feet moving, not static when WB. Step s around with shuffle to move to recliner.    Ambulation/Gait                  Stairs            Wheelchair Mobility    Modified Rankin (Stroke Patients Only)       Balance           Standing balance support: Reliant on assistive device for balance, Bilateral upper extremity supported, During functional activity Standing balance-Leahy Scale: Poor                               Pertinent Vitals/Pain Pain Assessment Faces Pain Scale: Hurts little more Pain Location: generalized, feet and legs Pain Descriptors / Indicators: Grimacing, Discomfort, Heaviness Pain Intervention(s): Monitored during session, Limited activity within patient's tolerance    Home Living Family/patient expects to be discharged to:: Unsure Living Arrangements: Alone Available Help at Discharge: Family;Available PRN/intermittently Type of Home: [place]  Access: Stairs to enter Entrance Stairs-Rails:  (says he has a rail unsure of which side) Entrance Stairs-Number of Steps: 2   Home Layout: One level Home Equipment: Conservation officer, nature (2 wheels)      Prior Function Prior Level of Function : Independent/Modified Independent             Mobility Comments: reports typically doesn't  use a device. Reports hx of TBI that effects his equilibrium ADLs Comments: independent     Hand Dominance   Dominant Hand: Right    Extremity/Trunk Assessment   Upper Extremity Assessment Upper Extremity Assessment: RUE deficits/detail;LUE deficits/detail RUE Deficits / Details: WFL ROM, shoulder strength 4-/5 otherwise 5/5 strench, middle digit has trigger fiinger RUE Sensation:  (reports intermittent numbness/tingling in fingers) RUE Coordination: WNL LUE Deficits / Details: WFL ROM, 4-/5 shoulder strength otherwise 5/5 strength LUE Sensation: WNL LUE Coordination: WNL    Lower Extremity Assessment Lower Extremity Assessment: RLE deficits/detail;LLE deficits/detail RLE Deficits / Details: grossly 4/5, same as left, ataxic RLE Sensation: decreased light touch LLE Deficits / Details: grossly 4/5 trmulaous/ataxic with WB    Cervical / Trunk Assessment Cervical / Trunk Assessment: Kyphotic  Communication   Communication: No difficulties  Cognition                                                General Comments      Exercises     Assessment/Plan    PT Assessment Patient needs continued PT services  PT Problem List Decreased strength;Decreased balance;Decreased knowledge of precautions;Decreased range of motion;Decreased mobility;Decreased knowledge of use of DME;Decreased activity tolerance;Decreased safety awareness       PT Treatment Interventions DME instruction;Therapeutic activities;Gait training;Therapeutic exercise;Patient/family education;Functional mobility training;Balance training    PT Goals (Current goals can be found in the Care Plan section)  Acute Rehab PT Goals Patient Stated Goal: to get stronger PT Goal Formulation: With patient Time For Goal Achievement: 09/26/21 Potential to Achieve Goals: Good    Frequency Min 2X/week     Co-evaluation PT/OT/SLP Co-Evaluation/Treatment: Yes Reason for Co-Treatment: To address  functional/ADL transfers PT goals addressed during session: Mobility/safety with mobility OT goals addressed during session: ADL's and self-care       AM-PAC PT "6 Clicks" Mobility  Outcome Measure Help needed turning from your back to your side while in a flat bed without using bedrails?: None Help needed moving from lying on your back to sitting on the side of a flat bed without using bedrails?: None Help needed moving to and from a bed to a chair (including a wheelchair)?: A Little Help needed standing up from a chair using your arms (e.g., wheelchair or bedside chair)?: A Little Help needed to walk in hospital room?: Total Help needed climbing 3-5 steps with a railing? : Total 6 Click Score: 16    End of Session Equipment Utilized During Treatment: Gait belt Activity Tolerance: Patient tolerated treatment well Patient left: in chair;with call bell/phone within reach;with chair alarm set Nurse Communication: Mobility status PT Visit Diagnosis: Muscle weakness (generalized) (M62.81);Difficulty in walking, not elsewhere classified (R26.2)    Time: 5681-2751 PT Time Calculation (min) (ACUTE ONLY): 21 min   Charges:   PT Evaluation $PT Eval Low Complexity: [place] PT Acute Rehabilitation Services Pager [phone] Office [phone]  Claretha Cooper 09/12/2021, 4:44 PM

## 2021-09-13 LAB — CBC WITH DIFFERENTIAL/PLATELET
Abs Immature Granulocytes: 0.09 10*3/uL — ABNORMAL HIGH (ref 0.00–0.07)
Basophils Absolute: 0.1 10*3/uL (ref 0.0–0.1)
Basophils Relative: 1 %
Eosinophils Absolute: 0.1 10*3/uL (ref 0.0–0.5)
Eosinophils Relative: 2 %
HCT: 36.8 % — ABNORMAL LOW (ref 39.0–52.0)
Hemoglobin: 11.9 g/dL — ABNORMAL LOW (ref 13.0–17.0)
Immature Granulocytes: 1 %
Lymphocytes Relative: 30 %
Lymphs Abs: 2.4 10*3/uL (ref 0.7–4.0)
MCH: 33.3 pg (ref 26.0–34.0)
MCHC: 32.3 g/dL (ref 30.0–36.0)
MCV: 103.1 fL — ABNORMAL HIGH (ref 80.0–100.0)
Monocytes Absolute: 1.4 10*3/uL — ABNORMAL HIGH (ref 0.1–1.0)
Monocytes Relative: 18 %
Neutro Abs: 3.9 10*3/uL (ref 1.7–7.7)
Neutrophils Relative %: 48 %
Platelets: 245 10*3/uL (ref 150–400)
RBC: 3.57 MIL/uL — ABNORMAL LOW (ref 4.22–5.81)
RDW: 16.5 % — ABNORMAL HIGH (ref 11.5–15.5)
WBC: 8 10*3/uL (ref 4.0–10.5)
nRBC: 0 % (ref 0.0–0.2)

## 2021-09-13 LAB — MAGNESIUM: Magnesium: 1.8 mg/dL (ref 1.7–2.4)

## 2021-09-13 LAB — TSH: TSH: 0.853 u[IU]/mL (ref 0.350–4.500)

## 2021-09-13 MED ORDER — LACTULOSE 10 GM/15ML PO SOLN
10.0000 g | Freq: Three times a day (TID) | ORAL | Status: DC
  Administered 2021-09-13 – 2021-09-23 (×29): 10 g via ORAL
  Filled 2021-09-13 (×30): qty 15

## 2021-09-13 NOTE — Plan of Care (Signed)

## 2021-09-13 NOTE — Progress Notes (Signed)
[facility] PROGRESS NOTE    Progress Note  [name]  YTK:[id number] DOB: [birth year] DOA: 09/07/2021 PCP: [name], MD     Brief Narrative:   [name] is an 51 y.o. male past medical history of alcohol abuse and alcoholic cirrhosis, schizoaffective disorder with seizures who comes in with withdrawal symptoms thinking he may to have a seizure.  Had a witnessed seizure in the ED and was treated with Ativan and loaded with Keppra.  Had to be started on Precedex, did fairly well overnight.  Has been slowly improving off Precedex started on Librium orally which she has been tolerating. Tolerating his diet.   Assessment/Plan:   Alcohol withdrawal seizure with complication (HCC) On Librium protocol, CIWA score less than 10. Physical therapy and Occupational Therapy, patient will need skilled nursing facility consult TOC Continue metoprolol heart rate is controlled. Check a TSH.  Hypokalemia Potassium still 3.7, replete orally mag 1.6, will continue oral repletion of magnesium.  Hepatic steatosis: Noted on CT.  Macrocytic anemia: Continue thiamine and folate. Likely due to alcohol abuse.  Essential hypertension: Continue hold Aldactone. Continue to use labetalol  GERD: Continue PPI.  Schizoaffective disorder: Continue Seroquel and Zoloft.  Sacral decubitus ulcer present on admission:   DVT prophylaxis: lovenox Family Communication:none Status is: Inpatient  Remains inpatient appropriate because: Alcohol withdrawal seizures.        Code Status:     Code Status Orders  (From admission, onward)           Start     Ordered   09/07/21 1215  Full code  Continuous        09/07/21 1214           Code Status History     Date Active Date Inactive Code Status Order ID Comments User Context   08/26/2021 0506 08/27/2021 1857 Full Code [id number]  Rayna Sexton, PA-C ED   07/16/2020 1752 08/06/2020 2031 Full Code [id number]  Iona Beard, MD ED   05/03/2020 0400 05/07/2020 1856 Full Code [id number]  Chotiner, Yevonne Aline, MD ED   10/05/2019 1645 10/07/2019 1845 Full Code [id number]  Asencion Noble, MD ED   03/16/2019 1933 03/20/2019 1518 Full Code [id number]  Shela Leff, MD ED   03/02/2019 0541 03/08/2019 1641 Full Code [id number]  Germain Osgood, PA-C ED   01/10/2019 2229 01/15/2019 1933 Full Code [id number]  Ivor Costa, MD ED   10/05/2018 0402 10/05/2018 1755 Full Code [id number]  Ward, Delice Bison, DO ED   06/02/2018 1914 06/08/2018 1626 Full Code [id number]  Suella Broad, FNP Inpatient   06/01/2018 0708 06/02/2018 1825 Full Code [id number]  Ripley Fraise, MD ED   02/22/2016 1928 02/23/2016 1510 Full Code [id number]  Patrecia Pour, NP Inpatient   10/10/2015 1408 10/11/2015 1436 Full Code [id number]  Leonard Schwartz, MD ED   09/30/2015 1656 10/04/2015 2154 Full Code [id number]  Debbe Odea, MD ED   06/29/2015 1426 06/30/2015 1902 Full Code [id number]  Kerrie Buffalo, NP Inpatient   06/28/2015 2339 06/29/2015 0928 Full Code [id number]  Antonietta Breach, PA-C ED   06/17/2015 1237 06/19/2015 1614 Full Code [id number]  Lurena Nida, NP Inpatient   06/16/2015 1633 06/17/2015 1237 Full Code [id number]  Merrily Pew, MD ED   05/13/2015 1353 05/14/2015 1700 Full Code [id number]  Niel Hummer, NP Inpatient   05/13/2015 0727 05/13/2015 1353 Full Code [id number]  Lacretia Leigh, MD ED   05/05/2015 1932 05/06/2015 1552 Full Code [id number]  Patrecia Pour,  NP Inpatient   05/05/2015 1852 05/05/2015 1932 Full Code [id number]  Delfin Gant, NP Inpatient   05/05/2015 0233 05/05/2015 1852 Full Code [id number]  Alvina Chou, PA-C ED   04/23/2015 1814 04/25/2015 1735 Full Code [id number]  Junius Creamer, NP ED   04/08/2015 0737 04/09/2015 2242 Full Code [id number]  Dorie Rank, MD ED   02/28/2015 1530 03/01/2015 1708 Full Code [id number]  Benjamine Mola, [place] Inpatient   02/28/2015 0544 02/28/2015 1530 Full Code [id number]  Charlann Lange, PA-C ED   02/03/2015  1321 02/09/2015 2125 Full Code [id number]  Encarnacion Slates, NP Inpatient   02/03/2015 0111 02/03/2015 1321 Full Code [id number]  Antonietta Breach, PA-C ED   12/25/2014 1638 12/30/2014 1725 Full Code [id number]  Benjamine Mola, [place] Inpatient   12/25/2014 1357 12/25/2014 1638 Full Code [id number]  Benjamine Mola, [place] Inpatient   12/24/2014 0751 12/25/2014 0429 Full Code [id number]  Carlisle Cater, PA-C ED   11/29/2014 1743 12/06/2014 1859 Full Code [id number]  Patrecia Pour, NP Inpatient   11/28/2014 1142 11/29/2014 1743 Full Code [id number]  Margarita Mail, PA-C ED   11/03/2014 2146 11/06/2014 2004 Full Code [id number]  Deneise Lever, MD ED   10/24/2014 2142 10/30/2014 1814 Full Code [id number]  Laverle Hobby, PA-C Inpatient   10/19/2014 0618 10/24/2014 2142 Full Code [id number]  Toy Baker, MD ED   09/11/2014 2139 09/12/2014 1922 Full Code [id number]  Antonietta Breach, PA-C ED   07/06/2014 1728 07/07/2014 1707 Full Code [id number]  Clayton Bibles, PA-C ED   05/04/2014 1303 05/05/2014 1653 Full Code [id number]  Clarene Reamer, MD Inpatient   02/11/2014 1833 02/16/2014 1656 Full Code [id number]  Delfin Gant, NP Inpatient   02/09/2014 1519 02/11/2014 1833 Full Code [id number]  Benjamine Mola, [place] Inpatient   02/09/2014 1323 02/09/2014 1519 Full Code [id number]  Benjamine Mola, [place] ED   02/09/2014 0143 02/09/2014 1323 Full Code [id number]  Martie Lee, PA-C ED   01/05/2014 0906 01/05/2014 1703 Full Code [id number]  Akintayo, [place] Inpatient   12/29/2013 1951 12/29/2013 2301 Full Code [id number]  Malena Peer, NP ED   12/13/2013 0324 12/13/2013 1003 Full Code [id number]  Domenic Moras, PA-C ED   12/02/2011 2322 12/08/2011 2325 Full Code [id number]  Hoy Morn, MD ED   11/25/2011 0151 11/25/2011 1451 Full Code [id number]  Abran Richard, PA-C ED   10/30/2011 0012 10/30/2011 0810 Full Code [id number]  Alfonzo Feller, DO ED   06/30/2011 1826 07/16/2011 1418 Full Code [id number]  Paulla Fore, RN Inpatient         IV Access:    Peripheral IV   Procedures and diagnostic studies:   No results found.   Medical Consultants:   None.   Subjective:    [name] no complaints tolerating his diet. Objective:    Vitals:   09/12/21 2200 09/12/21 2226 09/13/21 0000 09/13/21 0400  BP: 111/61 136/62 90/77   Pulse: 92 99 93 88  Resp: 19 (!) 21 (!) 22 (!) 22  Temp:    98.9 F (37.2 C)  TempSrc:    Oral  SpO2: 94% 94% 93% 94%  Weight:      Height:       SpO2: 94 %   Intake/Output Summary (Last 24 hours) at 09/13/2021 0707 Last data filed at 09/13/2021 0350 Gross per 24 hour  Intake 1086.63 ml  Output 2200 ml  Net -1113.37 ml    Filed Weights   09/07/21 0900  09/07/21 1500  Weight: 88.5 kg 94.7 kg    Exam: General exam: In no acute distress. Respiratory system: Good air movement and clear to auscultation. Cardiovascular system: S1 & S2 heard, RRR. No JVD. Gastrointestinal system: Abdomen is nondistended, soft and nontender.  Extremities: No pedal edema. Skin: No rashes, lesions or ulcers Psychiatry: Judgement and insight appear normal. Mood & affect appropriate.  Data Reviewed:    Labs: Basic Metabolic Panel: Recent Labs  Lab 09/07/21 0557 09/07/21 1501 09/08/21 0246 09/08/21 1000 09/09/21 0303 09/10/21 0258 09/11/21 0250 09/12/21 0245 09/13/21 0249  NA 134* 135   < > 136 134* 134* 133* 132*  --   K 2.5* 3.0*   < > 3.4* 3.8 4.1 3.6 3.7  --   CL 90* 97*   < > 102 104 107 106 104  --   CO2 24 30   < > 26 22 17* 20* 19*  --   GLUCOSE 172* 120*   < > 110* 94 82 126* 108*  --   BUN 7 8   < > 10 11 11 14 14   --   CREATININE 0.99 0.98   < > 0.94 0.95 0.86 0.96 0.89  --   CALCIUM 9.0 8.6*   < > 8.1* 8.5* 8.6* 8.6* 8.7*  --   MG 1.4* 2.2   < >  --  1.7 1.8 1.5* 1.6* 1.8  PHOS 2.1* 2.2*  --   --   --   --   --  4.2  --    < > = values in this interval not displayed.    GFR Estimated Creatinine Clearance: 121.2 mL/min (by C-G formula based on SCr of 0.89 mg/dL). Liver  Function Tests: Recent Labs  Lab 09/07/21 0557 09/07/21 1501 09/08/21 0246 09/09/21 0303 09/10/21 0258 09/11/21 0250 09/12/21 0245  AST 155*  --  186* 283* 180* 136*  --   ALT 69*  --  63* 98* 101* 94*  --   ALKPHOS 197*  --  160* 161* 161* 155*  --   BILITOT 1.7*  --  2.3* 2.0* 1.7* 1.3*  --   PROT 8.1  --  6.3* 6.3* 6.5 6.2*  --   ALBUMIN 4.7   < > 3.7 3.5 3.2* 3.1* 3.2*   < > = values in this interval not displayed.    No results for input(s): LIPASE, AMYLASE in the last 168 hours. No results for input(s): AMMONIA in the last 168 hours. Coagulation profile Recent Labs  Lab 09/08/21 1000  INR 1.1    COVID-19 Labs  No results for input(s): DDIMER, FERRITIN, LDH, CRP in the last 72 hours.  Lab Results  Component Value Date   SARSCOV2NAA NEGATIVE 09/07/2021   [place] NEGATIVE 08/26/2021   [place] NEGATIVE 08/05/2020   [place] NEGATIVE 07/16/2020    CBC: Recent Labs  Lab 09/09/21 0303 09/10/21 0258 09/11/21 0250 09/12/21 0245 09/13/21 0249  WBC 9.3 7.8 7.7 7.4 8.0  NEUTROABS 6.6 5.3 4.5 3.8 3.9  HGB 11.4* 12.9* 11.5* 12.2* 11.9*  HCT 34.3* 38.3* 34.0* 37.9* 36.8*  MCV 101.5* 100.8* 101.5* 103.8* 103.1*  PLT 132* 142* 174 191 245    Cardiac Enzymes: No results for input(s): CKTOTAL, CKMB, CKMBINDEX, TROPONINI in the last 168 hours. BNP (last 3 results) No results for input(s): PROBNP in the last 8760 hours. CBG: Recent Labs  Lab 09/08/21 0741  GLUCAP 107*    D-Dimer: No results for input(s): DDIMER in the last 72 hours.  Hgb A1c: No results for input(s): HGBA1C in the last 72 hours. Lipid Profile: No results for input(s): CHOL, HDL, LDLCALC, TRIG, CHOLHDL, LDLDIRECT in the last 72 hours. Thyroid function studies: No results for input(s): TSH, T4TOTAL, T3FREE, THYROIDAB in the last 72 hours.  Invalid input(s): FREET3 Anemia work up: No results for input(s): VITAMINB12, FOLATE, FERRITIN, TIBC, IRON, RETICCTPCT in the last 72  hours.  Sepsis Labs: Recent Labs  Lab 09/10/21 0258 09/11/21 0250 09/12/21 0245 09/13/21 0249  WBC 7.8 7.7 7.4 8.0    Microbiology Recent Results (from the past 240 hour(s))  Resp Panel by RT-PCR (Flu A&B, Covid) Nasopharyngeal Swab     Status: None   Collection Time: 09/07/21  7:26 AM   Specimen: Nasopharyngeal Swab; Nasopharyngeal(NP) swabs in vial transport medium  Result Value Ref Range Status   SARS Coronavirus 2 by RT PCR NEGATIVE NEGATIVE Final    Comment: (NOTE) SARS-CoV-2 target nucleic acids are NOT DETECTED.  The SARS-CoV-2 RNA is generally detectable in upper respiratory specimens during the acute phase of infection. The lowest concentration of SARS-CoV-2 viral copies this assay can detect is 138 copies/mL. A negative result does not preclude SARS-Cov-2 infection and should not be used as the sole basis for treatment or other patient management decisions. A negative result may occur with  improper specimen collection/handling, submission of specimen other than nasopharyngeal swab, presence of viral mutation(s) within the areas targeted by this assay, and inadequate number of viral copies(<138 copies/mL). A negative result must be combined with clinical observations, patient history, and epidemiological information. The expected result is Negative.  Fact Sheet for Patients:  EntrepreneurPulse.com.au  Fact Sheet for Healthcare Providers:  IncredibleEmployment.be  This test is no t yet approved or cleared by the [place] FDA and  has been authorized for detection and/or diagnosis of SARS-CoV-2 by FDA under an Emergency Use Authorization (EUA). This EUA will remain  in effect (meaning this test can be used) for the duration of the COVID-19 declaration under Section 564(b)(1) of the Act, 21 U.S.C.section 360bbb-3(b)(1), unless the authorization is terminated  or revoked sooner.       Influenza A by PCR NEGATIVE NEGATIVE  Final   Influenza B by PCR NEGATIVE NEGATIVE Final    Comment: (NOTE) The Xpert Xpress SARS-CoV-2/FLU/RSV plus assay is intended as an aid in the diagnosis of influenza from Nasopharyngeal swab specimens and should not be used as a sole basis for treatment. Nasal washings and aspirates are unacceptable for Xpert Xpress SARS-CoV-2/FLU/RSV testing.  Fact Sheet for Patients: EntrepreneurPulse.com.au  Fact Sheet for Healthcare Providers: IncredibleEmployment.be  This test is not yet approved or cleared by the [place] FDA and has been authorized for detection and/or diagnosis of SARS-CoV-2 by FDA under an Emergency Use Authorization (EUA). This EUA will remain in effect (meaning this test can be used) for the duration of the COVID-19 declaration under Section 564(b)(1) of the Act, 21 U.S.C. section 360bbb-3(b)(1), unless the authorization is terminated or revoked.  Performed at [facility], [place] [address], [place], [place] [address]   MRSA Next Gen by PCR, Nasal     Status: None   Collection Time: 09/07/21  1:52 PM   Specimen: Nasal Mucosa; Nasal Swab  Result Value Ref Range Status   MRSA by PCR Next Gen NOT DETECTED NOT DETECTED Final    Comment: (NOTE) The GeneXpert MRSA Assay (FDA approved for NASAL specimens only), is one component of a comprehensive MRSA colonization surveillance program. It is not  intended to diagnose MRSA infection nor to guide or monitor treatment for MRSA infections. Test performance is not FDA approved in patients less than 12 years old. Performed at [facility], [place] [address] [place], [place] [address]      Medications:    chlordiazePOXIDE  25 mg Oral TID   Followed by   chlordiazePOXIDE  25 mg Oral BH-qamhs   Followed by   Derrill Memo ON 09/14/2021] chlordiazePOXIDE  25 mg Oral Daily   Chlorhexidine Gluconate Cloth  6 each Topical Daily   folic acid  1 mg Oral Daily    gabapentin  300 mg Oral TID   levETIRAcetam  500 mg Oral BID   magnesium oxide  800 mg Oral BID   mouth rinse  15 mL Mouth Rinse BID   metoprolol tartrate  25 mg Oral BID   multivitamin with minerals  1 tablet Oral Daily   nicotine  21 mg Transdermal Daily   pantoprazole  40 mg Oral Daily   QUEtiapine  200 mg Oral QHS   sertraline  25 mg Oral Daily   thiamine  100 mg Oral Daily   Continuous Infusions:      LOS: 6 days   Charlynne Cousins  [facility]  09/13/2021, 7:07 AM

## 2021-09-14 ENCOUNTER — Other Ambulatory Visit: Payer: Self-pay

## 2021-09-14 NOTE — Plan of Care (Signed)
  Problem: Education: Goal: Knowledge of General Education information will improve Description Including pain rating scale, medication(s)/side effects and non-pharmacologic comfort measures Outcome: Progressing   

## 2021-09-14 NOTE — Progress Notes (Signed)
[facility] PROGRESS NOTE    Progress Note  [name]  IWO:[id number] DOB: 1971-03-10 DOA: 09/07/2021 PCP: [name], MD     Brief Narrative:   Arben Packman is an 51 y.o. male past medical history of alcohol abuse and alcoholic cirrhosis, schizoaffective disorder with seizures who comes in with withdrawal symptoms thinking he may to have a seizure.  Had a witnessed seizure in the ED and was treated with Ativan and loaded with Keppra.  Had to be started on Precedex, did fairly well overnight.  Has been slowly improving off Precedex started on Librium orally which she has been tolerating. Tolerating his diet.   Assessment/Plan:   Alcohol withdrawal seizure with complication (HCC) Currently on Librium taper CIWA score less than 10. Physical therapy and Occupational Therapy, patient will need skilled nursing facility consult TOC Continue metoprolol heart rate is controlled. TSH unremarkable.  Hypokalemia Potassium still 3.7, replete orally mag 1.6, will continue oral repletion of magnesium.  Hepatic steatosis: Noted on CT.  Macrocytic anemia: Continue thiamine and folate. Likely due to alcohol abuse.  Essential hypertension: Continue hold Aldactone. Continue to use labetalol  GERD: Continue PPI.  Schizoaffective disorder: Continue Seroquel and Zoloft.  Sacral decubitus ulcer present on admission:   DVT prophylaxis: lovenox Family Communication:none Status is: Inpatient  Remains inpatient appropriate because: Alcohol withdrawal seizures.        Code Status:     Code Status Orders  (From admission, onward)           Start     Ordered   09/07/21 1215  Full code  Continuous        09/07/21 1214           Code Status History     Date Active Date Inactive Code Status Order ID Comments User Context   08/26/2021 0506 08/27/2021 1857 Full Code [id number]  Rayna Sexton, PA-C ED   07/16/2020 1752 08/06/2020 2031 Full Code [id number]   Iona Beard, MD ED   05/03/2020 0400 05/07/2020 1856 Full Code [id number]  Chotiner, Yevonne Aline, MD ED   10/05/2019 1645 10/07/2019 1845 Full Code [id number]  Asencion Noble, MD ED   03/16/2019 1933 03/20/2019 1518 Full Code [id number]  Shela Leff, MD ED   03/02/2019 0541 03/08/2019 1641 Full Code [id number]  Germain Osgood, PA-C ED   01/10/2019 2229 01/15/2019 1933 Full Code [id number]  Ivor Costa, MD ED   10/05/2018 0402 10/05/2018 1755 Full Code [id number]  Ward, Delice Bison, DO ED   06/02/2018 1914 06/08/2018 1626 Full Code [id number]  Suella Broad, FNP Inpatient   06/01/2018 0708 06/02/2018 1825 Full Code [id number]  Ripley Fraise, MD ED   02/22/2016 1928 02/23/2016 1510 Full Code [id number]  Patrecia Pour, NP Inpatient   10/10/2015 1408 10/11/2015 1436 Full Code [id number]  Leonard Schwartz, MD ED   09/30/2015 1656 10/04/2015 2154 Full Code [id number]  Debbe Odea, MD ED   06/29/2015 1426 06/30/2015 1902 Full Code [id number]  Kerrie Buffalo, NP Inpatient   06/28/2015 2339 06/29/2015 0928 Full Code [id number]  Antonietta Breach, PA-C ED   06/17/2015 1237 06/19/2015 1614 Full Code [id number]  Lurena Nida, NP Inpatient   06/16/2015 1633 06/17/2015 1237 Full Code [id number]  Merrily Pew, MD ED   05/13/2015 1353 05/14/2015 1700 Full Code [id number]  Niel Hummer, NP Inpatient   05/13/2015 0727 05/13/2015 1353 Full Code [id number]  Lacretia Leigh, MD ED   05/05/2015 1932 05/06/2015 1552 Full Code [id number]  Patrecia Pour,  NP Inpatient   05/05/2015 1852 05/05/2015 1932 Full Code [id number]  Delfin Gant, NP Inpatient   05/05/2015 0233 05/05/2015 1852 Full Code [id number]  Alvina Chou, PA-C ED   04/23/2015 1814 04/25/2015 1735 Full Code [id number]  Junius Creamer, NP ED   04/08/2015 0737 04/09/2015 2242 Full Code [id number]  Dorie Rank, MD ED   02/28/2015 1530 03/01/2015 1708 Full Code [id number]  Benjamine Mola, [place] Inpatient   02/28/2015 0544 02/28/2015 1530 Full Code [id number]  Charlann Lange, PA-C ED    02/03/2015 1321 02/09/2015 2125 Full Code [id number]  Encarnacion Slates, NP Inpatient   02/03/2015 0111 02/03/2015 1321 Full Code [id number]  Antonietta Breach, PA-C ED   12/25/2014 1638 12/30/2014 1725 Full Code [id number]  Benjamine Mola, [place] Inpatient   12/25/2014 1357 12/25/2014 1638 Full Code [id number]  Benjamine Mola, [place] Inpatient   12/24/2014 0751 12/25/2014 0429 Full Code [id number]  Carlisle Cater, PA-C ED   11/29/2014 1743 12/06/2014 1859 Full Code [id number]  Patrecia Pour, NP Inpatient   11/28/2014 1142 11/29/2014 1743 Full Code [id number]  Margarita Mail, PA-C ED   11/03/2014 2146 11/06/2014 2004 Full Code [id number]  Deneise Lever, MD ED   10/24/2014 2142 10/30/2014 1814 Full Code [id number]  Laverle Hobby, PA-C Inpatient   10/19/2014 0618 10/24/2014 2142 Full Code [id number]  Toy Baker, MD ED   09/11/2014 2139 09/12/2014 1922 Full Code [id number]  Antonietta Breach, PA-C ED   07/06/2014 1728 07/07/2014 1707 Full Code [id number]  Clayton Bibles, PA-C ED   05/04/2014 1303 05/05/2014 1653 Full Code [id number]  Clarene Reamer, MD Inpatient   02/11/2014 1833 02/16/2014 1656 Full Code [id number]  Delfin Gant, NP Inpatient   02/09/2014 1519 02/11/2014 1833 Full Code [id number]  Benjamine Mola, [place] Inpatient   02/09/2014 1323 02/09/2014 1519 Full Code [id number]  Benjamine Mola, [place] ED   02/09/2014 0143 02/09/2014 1323 Full Code [id number]  Martie Lee, PA-C ED   01/05/2014 0906 01/05/2014 1703 Full Code [id number]  Akintayo, [place] Inpatient   12/29/2013 1951 12/29/2013 2301 Full Code [id number]  Malena Peer, NP ED   12/13/2013 0324 12/13/2013 1003 Full Code [id number]  Domenic Moras, PA-C ED   12/02/2011 2322 12/08/2011 2325 Full Code [id number]  Hoy Morn, MD ED   11/25/2011 0151 11/25/2011 1451 Full Code [id number]  Abran Richard, PA-C ED   10/30/2011 0012 10/30/2011 0810 Full Code [id number]  Alfonzo Feller, DO ED   06/30/2011 1826 07/16/2011 1418 Full Code [id number]  Paulla Fore, RN Inpatient          IV Access:   Peripheral IV   Procedures and diagnostic studies:   No results found.   Medical Consultants:   None.   Subjective:    [place]on Shock no complaints Objective:    Vitals:   09/13/21 1900 09/13/21 2000 09/13/21 2002 09/14/21 0000  BP:  113/73 113/73 123/81  Pulse:  99 98 93  Resp:  (!) 23 (!) 23 18  Temp: 98.3 F (36.8 C)     TempSrc: Oral     SpO2:  93% 93% 90%  Weight:      Height:       SpO2: 90 %   Intake/Output Summary (Last 24 hours) at 09/14/2021 0703 Last data filed at 09/14/2021 0200 Gross per 24 hour  Intake 240 ml  Output 2250 ml  Net -2010 ml    Filed Weights   09/07/21 0900 09/07/21 1500  Weight:  88.5 kg 94.7 kg    Exam: General exam: In no acute distress. Respiratory system: Good air movement and clear to auscultation. Cardiovascular system: S1 & S2 heard, RRR. No JVD. Gastrointestinal system: Abdomen is nondistended, soft and nontender.  Extremities: No pedal edema. Skin: No rashes, lesions or ulcers Psychiatry: Judgement and insight appear normal. Mood & affect appropriate.  Data Reviewed:    Labs: Basic Metabolic Panel: Recent Labs  Lab 09/07/21 1501 09/08/21 0246 09/08/21 1000 09/09/21 0303 09/10/21 0258 09/11/21 0250 09/12/21 0245 09/13/21 0249  NA 135   < > 136 134* 134* 133* 132*  --   K 3.0*   < > 3.4* 3.8 4.1 3.6 3.7  --   CL 97*   < > 102 104 107 106 104  --   CO2 30   < > 26 22 17* 20* 19*  --   GLUCOSE 120*   < > 110* 94 82 126* 108*  --   BUN 8   < > 10 11 11 14 14   --   CREATININE 0.98   < > 0.94 0.95 0.86 0.96 0.89  --   CALCIUM 8.6*   < > 8.1* 8.5* 8.6* 8.6* 8.7*  --   MG 2.2   < >  --  1.7 1.8 1.5* 1.6* 1.8  PHOS 2.2*  --   --   --   --   --  4.2  --    < > = values in this interval not displayed.    GFR Estimated Creatinine Clearance: 121.2 mL/min (by C-G formula based on SCr of 0.89 mg/dL). Liver Function Tests: Recent Labs  Lab 09/08/21 0246 09/09/21 0303 09/10/21 0258  09/11/21 0250 09/12/21 0245  AST 186* 283* 180* 136*  --   ALT 63* 98* 101* 94*  --   ALKPHOS 160* 161* 161* 155*  --   BILITOT 2.3* 2.0* 1.7* 1.3*  --   PROT 6.3* 6.3* 6.5 6.2*  --   ALBUMIN 3.7 3.5 3.2* 3.1* 3.2*    No results for input(s): LIPASE, AMYLASE in the last 168 hours. No results for input(s): AMMONIA in the last 168 hours. Coagulation profile Recent Labs  Lab 09/08/21 1000  INR 1.1    COVID-19 Labs  No results for input(s): DDIMER, FERRITIN, LDH, CRP in the last 72 hours.  Lab Results  Component Value Date   SARSCOV2NAA NEGATIVE 09/07/2021   [place] NEGATIVE 08/26/2021   [place] NEGATIVE 08/05/2020   [place] NEGATIVE 07/16/2020    CBC: Recent Labs  Lab 09/09/21 0303 09/10/21 0258 09/11/21 0250 09/12/21 0245 09/13/21 0249  WBC 9.3 7.8 7.7 7.4 8.0  NEUTROABS 6.6 5.3 4.5 3.8 3.9  HGB 11.4* 12.9* 11.5* 12.2* 11.9*  HCT 34.3* 38.3* 34.0* 37.9* 36.8*  MCV 101.5* 100.8* 101.5* 103.8* 103.1*  PLT 132* 142* 174 191 245    Cardiac Enzymes: No results for input(s): CKTOTAL, CKMB, CKMBINDEX, TROPONINI in the last 168 hours. BNP (last 3 results) No results for input(s): PROBNP in the last 8760 hours. CBG: Recent Labs  Lab 09/08/21 0741  GLUCAP 107*    D-Dimer: No results for input(s): DDIMER in the last 72 hours. Hgb A1c: No results for input(s): HGBA1C in the last 72 hours. Lipid Profile: No results for input(s): CHOL, HDL, LDLCALC, TRIG, CHOLHDL, LDLDIRECT in the last 72 hours. Thyroid function studies: No results for input(s): TSH, T4TOTAL, T3FREE, THYROIDAB in the last 72 hours.  Invalid input(s): FREET3 Anemia work up: No results for input(s):  VITAMINB12, FOLATE, FERRITIN, TIBC, IRON, RETICCTPCT in the last 72 hours.  Sepsis Labs: Recent Labs  Lab 09/10/21 0258 09/11/21 0250 09/12/21 0245 09/13/21 0249  WBC 7.8 7.7 7.4 8.0    Microbiology Recent Results (from the past 240 hour(s))  Resp Panel by RT-PCR (Flu A&B,  Covid) Nasopharyngeal Swab     Status: None   Collection Time: 09/07/21  7:26 AM   Specimen: Nasopharyngeal Swab; Nasopharyngeal(NP) swabs in vial transport medium  Result Value Ref Range Status   SARS Coronavirus 2 by RT PCR NEGATIVE NEGATIVE Final    Comment: (NOTE) SARS-CoV-2 target nucleic acids are NOT DETECTED.  The SARS-CoV-2 RNA is generally detectable in upper respiratory specimens during the acute phase of infection. The lowest concentration of SARS-CoV-2 viral copies this assay can detect is 138 copies/mL. A negative result does not preclude SARS-Cov-2 infection and should not be used as the sole basis for treatment or other patient management decisions. A negative result may occur with  improper specimen collection/handling, submission of specimen other than nasopharyngeal swab, presence of viral mutation(s) within the areas targeted by this assay, and inadequate number of viral copies(<138 copies/mL). A negative result must be combined with clinical observations, patient history, and epidemiological information. The expected result is Negative.  Fact Sheet for Patients:  EntrepreneurPulse.com.au  Fact Sheet for Healthcare Providers:  IncredibleEmployment.be  This test is no t yet approved or cleared by the [place] FDA and  has been authorized for detection and/or diagnosis of SARS-CoV-2 by FDA under an Emergency Use Authorization (EUA). This EUA will remain  in effect (meaning this test can be used) for the duration of the COVID-19 declaration under Section 564(b)(1) of the Act, 21 U.S.C.section 360bbb-3(b)(1), unless the authorization is terminated  or revoked sooner.       Influenza A by PCR NEGATIVE NEGATIVE Final   Influenza B by PCR NEGATIVE NEGATIVE Final    Comment: (NOTE) The Xpert Xpress SARS-CoV-2/FLU/RSV plus assay is intended as an aid in the diagnosis of influenza from Nasopharyngeal swab specimens and should  not be used as a sole basis for treatment. Nasal washings and aspirates are unacceptable for Xpert Xpress SARS-CoV-2/FLU/RSV testing.  Fact Sheet for Patients: EntrepreneurPulse.com.au  Fact Sheet for Healthcare Providers: IncredibleEmployment.be  This test is not yet approved or cleared by the [place] FDA and has been authorized for detection and/or diagnosis of SARS-CoV-2 by FDA under an Emergency Use Authorization (EUA). This EUA will remain in effect (meaning this test can be used) for the duration of the COVID-19 declaration under Section 564(b)(1) of the Act, 21 U.S.C. section 360bbb-3(b)(1), unless the authorization is terminated or revoked.  Performed at [facility], [place] [address] [place], [place] [address]   MRSA Next Gen by PCR, Nasal     Status: None   Collection Time: 09/07/21  1:52 PM   Specimen: Nasal Mucosa; Nasal Swab  Result Value Ref Range Status   MRSA by PCR Next Gen NOT DETECTED NOT DETECTED Final    Comment: (NOTE) The GeneXpert MRSA Assay (FDA approved for NASAL specimens only), is one component of a comprehensive MRSA colonization surveillance program. It is not intended to diagnose MRSA infection nor to guide or monitor treatment for MRSA infections. Test performance is not FDA approved in patients less than 55 years old. Performed at [facility], [place] [address], [place], [place] [address]      Medications:    chlordiazePOXIDE  25 mg Oral BH-qamhs  Followed by   chlordiazePOXIDE  25 mg Oral Daily   Chlorhexidine Gluconate Cloth  6 each Topical Daily   folic acid  1 mg Oral Daily   gabapentin  300 mg Oral TID   lactulose  10 g Oral TID   levETIRAcetam  500 mg Oral BID   magnesium oxide  800 mg Oral BID   mouth rinse  15 mL Mouth Rinse BID   metoprolol tartrate  25 mg Oral BID   multivitamin with minerals  1 tablet Oral Daily   nicotine  21 mg Transdermal  Daily   pantoprazole  40 mg Oral Daily   QUEtiapine  200 mg Oral QHS   sertraline  25 mg Oral Daily   thiamine  100 mg Oral Daily   Continuous Infusions:      LOS: 7 days   Charlynne Cousins  [facility]  09/14/2021, 7:03 AM

## 2021-09-15 LAB — GLUCOSE, CAPILLARY: Glucose-Capillary: 114 mg/dL — ABNORMAL HIGH (ref 70–99)

## 2021-09-15 NOTE — TOC Initial Note (Signed)
Transition of Care [facility]) - Initial/Assessment Note    Patient Details  Name: [name] MRN: [id number] Date of Birth: 1971/07/18  Transition of Care [facility]) CM/SW Contact:    Ross Ludwig, LCSW Phone Number: 09/15/2021, 5:27 PM  Clinical Narrative:                  Patient is a 51 year old male who is alert and oriented x4.  Patient has been seen by PT and they are recommending SNF placement for short term rehab.  CSW began bed search in [place].  Patient is also a Level 2 Passar and will need a new Passar number because the one he had previously has expired.  Patient may not agree to SNF, but CSW will begin bed search.  Expected Discharge Plan: Skilled Nursing Facility Barriers to Discharge: [place] [name]), Active Substance Use - Placement, Continued Medical Work up   Patient Goals and CMS Choice Patient states their goals for this hospitalization and ongoing recovery are:: To go to SNF for short term rehab, then return back home CMS Medicare.gov Compare Post Acute Care list provided to:: Patient Choice offered to / list presented to : Patient  Expected Discharge Plan and Services Expected Discharge Plan: [place] In-house Referral: Clinical Social Work                                            Prior Living Arrangements/Services   Lives with:: Self Patient language and need for interpreter reviewed:: Yes Do you feel safe going back to the place where you live?: No   Patient feels he needs some rehab before returning back home.  Need for Family Participation in Patient Care: No (Comment) Care giver support system in place?: No (comment)   Criminal Activity/Legal Involvement Pertinent to Current Situation/Hospitalization: No - Comment as needed  Activities of Daily Living Home Assistive Devices/Equipment: Eyeglasses, Gilford Rile (specify type) ADL Screening (condition at time of admission) Patient's cognitive ability  adequate to safely complete daily activities?: Yes Is the patient deaf or have difficulty hearing?: No Does the patient have difficulty seeing, even when wearing glasses/contacts?: No Does the patient have difficulty concentrating, remembering, or making decisions?: No Patient able to express need for assistance with ADLs?: Yes Does the patient have difficulty dressing or bathing?: Yes Independently performs ADLs?: No Communication: Independent Dressing (OT): Needs assistance Is this a change from baseline?: Change from baseline, expected to last >3 days Grooming: Needs assistance Is this a change from baseline?: Change from baseline, expected to last >3 days Feeding: Independent with device (comment) (Help setting up) Bathing: Dependent Is this a change from baseline?: Change from baseline, expected to last >3 days Toileting: Needs assistance Is this a change from baseline?: Change from baseline, expected to last >3days In/Out Bed: Needs assistance Is this a change from baseline?: Change from baseline, expected to last >3 days Walks in Home: Dependent Is this a change from baseline?: Change from baseline, expected to last >3 days Does the patient have difficulty walking or climbing stairs?: Yes Weakness of Legs: Both Weakness of Arms/Hands: Both  Permission Sought/Granted Permission sought to share information with : Case Manager, Customer service manager, Family Supports Permission granted to share information with : Yes, Verbal Permission Granted  Share Information with NAME: Clyne,Peggy Sister   [place] Mother [phone]  Stineman,Mark Brother [phone]  Permission granted to share info w AGENCY: SNF admissions        Emotional Assessment Appearance:: Appears stated age   Affect (typically observed): Accepting, Calm, Appropriate Orientation: : Oriented to Self, Oriented to Place, Oriented to Situation, Oriented to  Time Alcohol / Substance  Use: Illicit Drugs, Alcohol Use Psych Involvement: Yes (comment)  Admission diagnosis:  Alcohol withdrawal ([place]) [F10.939] Elevated LFTs [R79.89] Patient Active Problem List   Diagnosis Date Noted   Trigger finger, right middle finger 29/92/4268   Alcoholic hepatitis with ascites    Acute respiratory failure (HCC)    Pressure injury of skin 07/17/2020   Encephalopathy, hepatic 07/16/2020   Macrocytic anemia    Hepatic steatosis    Hypomagnesemia 05/03/2020   Hyperbilirubinemia 05/03/2020   D-dimer, elevated 05/03/2020   Alcoholism ([place]) 05/03/2020   Rhabdomyolysis 34/19/6222   Alcoholic hepatitis without ascites 03/06/2019   Alcohol withdrawal ([place]) 01/15/2019   HTN (hypertension) 01/10/2019   Fall 01/10/2019   Alcohol intoxication ([place]) 01/10/2019   Schizoaffective disorder, bipolar type ([place]) 06/02/2018   Alcohol dependence with uncomplicated withdrawal ([place]) 02/22/2016   Alcohol-induced mood disorder ([place]) 02/22/2016   Gout 10/22/2015   GERD (gastroesophageal reflux disease) 10/22/2015   Alcohol withdrawal seizure with complication ([place]) 97/98/9211   Schizoaffective disorder, depressive type ([place])    History of alcohol abuse    Tobacco dependence due to cigarettes 11/20/2014   Alcohol abuse with intoxication ([place])    Hyponatremia 10/18/2014   Abnormal LFTs    Alcohol abuse 02/09/2014   Post traumatic stress disorder (PTSD) 11/03/2011   Hypokalemia 07/02/2011   PCP:  [name], MD Pharmacy:   [facility], [place] [address] [place] [address] Phone: [phone] Fax: [phone]  [id number] [place], [place] [address] Phone: [phone] Fax: [phone]     Social Determinants of Health (SDOH) Interventions    Readmission Risk Interventions Readmission Risk Prevention  Plan 01/15/2019  Medication Screening Complete  Transportation Screening Complete  Some recent data might be hidden

## 2021-09-15 NOTE — Plan of Care (Signed)
°  Problem: Coping: Goal: Ability to adjust to condition or change in health will improve Outcome: Progressing   Problem: Health Behavior/Discharge Planning: Goal: Compliance with prescribed medication regimen will improve Outcome: Progressing   Problem: Clinical Measurements: Goal: Complications related to the disease process, condition or treatment will be avoided or minimized Outcome: Progressing   Problem: Safety: Goal: Verbalization of understanding the information provided will improve Outcome: Progressing

## 2021-09-15 NOTE — NC FL2 (Signed)
[place] LEVEL OF CARE SCREENING TOOL     IDENTIFICATION  Patient Name: [name] Birthdate: [birth year] Sex: male Admission Date (Current Location): 09/07/2021  [facility] and [place] Number:  Herbalist and Address:  [facility],  [place], [place]      Provider Number: [id number]  Attending Physician Name and Address:  Charlynne Cousins, MD  Relative Name and Phone Number:  [place]key,Peggy Sister   [place] Mother [phone]    [place] Brother [phone]    Current Level of Care: Hospital Recommended Level of Care: [place] Prior Approval Number:    Date Approved/Denied:   PASRR Number: Pending  Discharge Plan: SNF    Current Diagnoses: Patient Active Problem List   Diagnosis Date Noted   Trigger finger, right middle finger 51/76/1607   Alcoholic hepatitis with ascites    Acute respiratory failure (HCC)    Pressure injury of skin 07/17/2020   Encephalopathy, hepatic 07/16/2020   Macrocytic anemia    Hepatic steatosis    Hypomagnesemia 05/03/2020   Hyperbilirubinemia 05/03/2020   D-dimer, elevated 05/03/2020   Alcoholism ([place]) 05/03/2020   Rhabdomyolysis 37/05/6268   Alcoholic hepatitis without ascites 03/06/2019   Alcohol withdrawal ([place]) 01/15/2019   HTN (hypertension) 01/10/2019   Fall 01/10/2019   Alcohol intoxication ([address] [place] [address]) 01/10/2019   Schizoaffective disorder, bipolar type ([place]) 06/02/2018   Alcohol dependence with uncomplicated withdrawal ([place]) 02/22/2016   Alcohol-induced mood disorder ([place]) 02/22/2016   Gout 10/22/2015   GERD (gastroesophageal reflux disease) 10/22/2015   Alcohol withdrawal seizure with complication ([place]) 48/54/6270   Schizoaffective disorder, depressive type ([place])    History of alcohol abuse    Tobacco dependence due to cigarettes 11/20/2014   Alcohol abuse with intoxication ([place])    Hyponatremia 10/18/2014   Abnormal LFTs     Alcohol abuse 02/09/2014   Post traumatic stress disorder (PTSD) 11/03/2011   Hypokalemia 07/02/2011    Orientation RESPIRATION BLADDER Height & Weight     Self, Time, Place, Situation  Normal Incontinent Weight: 208 lb 12.4 oz (94.7 kg) Height:  6' (182.9 cm)  BEHAVIORAL SYMPTOMS/MOOD NEUROLOGICAL BOWEL NUTRITION STATUS    Convulsions/Seizures Continent Diet (Cardiac)  AMBULATORY STATUS COMMUNICATION OF NEEDS Skin   Limited Assist Verbally Normal                       Personal Care Assistance Level of Assistance  Bathing, Feeding, Dressing Bathing Assistance: Limited assistance Feeding assistance: Independent Dressing Assistance: Limited assistance     Functional Limitations Info  Sight, Hearing, Speech Sight Info: Adequate Hearing Info: Adequate Speech Info: Adequate    SPECIAL CARE FACTORS FREQUENCY  PT (By licensed PT), OT (By licensed OT)     PT Frequency: Minimum 5x a week OT Frequency: Minimum 5x a week            Contractures Contractures Info: Not present    Additional Factors Info  Code Status, Allergies, Psychotropic Code Status Info: Full Code Allergies Info: Bupropion Psychotropic Info: sertraline (ZOLOFT) tablet 25 mg, QUEtiapine (SEROQUEL) tablet 200 mg         Current Medications (09/15/2021):  This is the current hospital active medication list Current Facility-Administered Medications  Medication Dose Route Frequency Provider Last Rate Last Admin   Chlorhexidine Gluconate Cloth 2 % PADS 6 each  6 each Topical Daily Charlynne Cousins, MD   6 each at 35/00/93 8182   folic acid (FOLVITE) tablet  1 mg  1 mg Oral Daily Charlynne Cousins, MD   1 mg at 09/15/21 0913   gabapentin (NEURONTIN) capsule 300 mg  300 mg Oral TID Charlynne Cousins, MD   300 mg at 09/15/21 1619   labetalol (NORMODYNE) injection 10 mg  10 mg Intravenous Q4H PRN Charlynne Cousins, MD       lactulose [facility]) 10 GM/15ML solution 10 g  10 g Oral TID Charlynne Cousins, MD   10 g at 09/15/21 1619   levETIRAcetam (KEPPRA) tablet 500 mg  500 mg Oral BID Charlynne Cousins, MD   500 mg at 09/15/21 0912   magnesium oxide (MAG-OX) tablet 800 mg  800 mg Oral BID Charlynne Cousins, MD   800 mg at 09/15/21 0913   MEDLINE mouth rinse  15 mL Mouth Rinse BID Charlynne Cousins, MD   15 mL at 09/15/21 1108   metoprolol tartrate (LOPRESSOR) tablet 25 mg  25 mg Oral BID Charlynne Cousins, MD   25 mg at 09/15/21 0912   multivitamin with minerals tablet 1 tablet  1 tablet Oral Daily Charlynne Cousins, MD   1 tablet at 09/15/21 0912   nicotine (NICODERM CQ - dosed in mg/24 hours) patch 21 mg  21 mg Transdermal Daily Charlynne Cousins, MD   21 mg at 09/15/21 0916   oxyCODONE (Oxy IR/ROXICODONE) immediate release tablet 5 mg  5 mg Oral Q4H PRN Charlynne Cousins, MD   5 mg at 09/15/21 1419   pantoprazole (PROTONIX) EC tablet 40 mg  40 mg Oral Daily Charlynne Cousins, MD   40 mg at 09/15/21 0913   polyvinyl alcohol (LIQUIFILM TEARS) 1.4 % ophthalmic solution 1 drop  1 drop Both Eyes PRN Charlynne Cousins, MD   1 drop at 09/15/21 1621   prochlorperazine (COMPAZINE) injection 10 mg  10 mg Intravenous Q6H PRN Charlynne Cousins, MD       QUEtiapine (SEROQUEL) tablet 200 mg  200 mg Oral QHS Charlynne Cousins, MD   200 mg at 09/14/21 2113   sertraline (ZOLOFT) tablet 25 mg  25 mg Oral Daily Charlynne Cousins, MD   25 mg at 09/15/21 0913   thiamine tablet 100 mg  100 mg Oral Daily Charlynne Cousins, MD   100 mg at 09/15/21 5038     Discharge Medications: Please see discharge summary for a list of discharge medications.  Relevant Imaging Results:  Relevant Lab Results:   Additional Information SSN [id number]  Ross Ludwig, LCSW

## 2021-09-15 NOTE — Progress Notes (Signed)
[facility] PROGRESS NOTE    Progress Note  [name]  MBW:[id number] DOB: [birth year] DOA: 09/07/2021 PCP: [name], MD     Brief Narrative:   [name] is an 51 y.o. male past medical history of alcohol abuse and alcoholic cirrhosis, schizoaffective disorder with seizures who comes in with withdrawal symptoms thinking he may to have a seizure.  Had a witnessed seizure in the ED and was treated with Ativan and loaded with Keppra.  Had to be started on Precedex, did fairly well overnight.  Has been slowly improving off Precedex started on Librium orally which she has been tolerating. Tolerating his diet.   Assessment/Plan:   Alcohol withdrawal seizure with complication (HCC) Continue the Librium taper. Were currently awaiting skilled nursing facility placement. No events overnight.  Hypokalemia Potassium still 3.7, replete orally mag 1.6, will continue oral repletion of magnesium.  Hepatic steatosis: Noted on CT.  Macrocytic anemia: Continue thiamine and folate. Likely due to alcohol abuse.  Essential hypertension: Continue hold Aldactone. Continue to use labetalol  GERD: Continue PPI.  Schizoaffective disorder: Continue Seroquel and Zoloft.  Sacral decubitus ulcer present on admission:   DVT prophylaxis: lovenox Family Communication:none Status is: Inpatient  Remains inpatient appropriate because: Alcohol withdrawal seizures.        Code Status:     Code Status Orders  (From admission, onward)           Start     Ordered   09/07/21 1215  Full code  Continuous        09/07/21 1214           Code Status History     Date Active Date Inactive Code Status Order ID Comments User Context   08/26/2021 0506 08/27/2021 1857 Full Code [id number]  Rayna Sexton, PA-C ED   07/16/2020 1752 08/06/2020 2031 Full Code [id number]  Iona Beard, MD ED   05/03/2020 0400 05/07/2020 1856 Full Code [id number]  Chotiner, Yevonne Aline, MD ED    10/05/2019 1645 10/07/2019 1845 Full Code [id number]  Asencion Noble, MD ED   03/16/2019 1933 03/20/2019 1518 Full Code [id number]  Shela Leff, MD ED   03/02/2019 0541 03/08/2019 1641 Full Code [id number]  Germain Osgood, PA-C ED   01/10/2019 2229 01/15/2019 1933 Full Code [id number]  Ivor Costa, MD ED   10/05/2018 0402 10/05/2018 1755 Full Code [id number]  Ward, Delice Bison, DO ED   06/02/2018 1914 06/08/2018 1626 Full Code [id number]  Suella Broad, FNP Inpatient   06/01/2018 0708 06/02/2018 1825 Full Code [id number]  Ripley Fraise, MD ED   02/22/2016 1928 02/23/2016 1510 Full Code [id number]  Patrecia Pour, NP Inpatient   10/10/2015 1408 10/11/2015 1436 Full Code [id number]  Leonard Schwartz, MD ED   09/30/2015 1656 10/04/2015 2154 Full Code [id number]  Debbe Odea, MD ED   06/29/2015 1426 06/30/2015 1902 Full Code [id number]  Kerrie Buffalo, NP Inpatient   06/28/2015 2339 06/29/2015 0928 Full Code [id number]  Antonietta Breach, PA-C ED   06/17/2015 1237 06/19/2015 1614 Full Code [id number]  Lurena Nida, NP Inpatient   06/16/2015 1633 06/17/2015 1237 Full Code [id number]  Merrily Pew, MD ED   05/13/2015 1353 05/14/2015 1700 Full Code [id number]  Niel Hummer, NP Inpatient   05/13/2015 0727 05/13/2015 1353 Full Code [id number]  Lacretia Leigh, MD ED   05/05/2015 1932 05/06/2015 1552 Full Code [id number]  Patrecia Pour, NP Inpatient   05/05/2015 1852 05/05/2015 1932 Full Code [id number]  Delfin Gant, NP  Inpatient   05/05/2015 0233 05/05/2015 1852 Full Code [id number]  Marko Plume ED   04/23/2015 1814 04/25/2015 1735 Full Code [id number]  Junius Creamer, NP ED   04/08/2015 0737 04/09/2015 2242 Full Code [id number]  Dorie Rank, MD ED   02/28/2015 1530 03/01/2015 1708 Full Code [id number]  Benjamine Mola, [place] Inpatient   02/28/2015 0544 02/28/2015 1530 Full Code [id number]  Charlann Lange, PA-C ED   02/03/2015 1321 02/09/2015 2125 Full Code [id number]  Encarnacion Slates, NP Inpatient   02/03/2015 0111 02/03/2015  1321 Full Code [id number]  Antonietta Breach, PA-C ED   12/25/2014 1638 12/30/2014 1725 Full Code [id number]  Benjamine Mola, [place] Inpatient   12/25/2014 1357 12/25/2014 1638 Full Code [id number]  Benjamine Mola, [place] Inpatient   12/24/2014 0751 12/25/2014 0429 Full Code [id number]  Carlisle Cater, PA-C ED   11/29/2014 1743 12/06/2014 1859 Full Code [id number]  Patrecia Pour, NP Inpatient   11/28/2014 1142 11/29/2014 1743 Full Code [id number]  Margarita Mail, PA-C ED   11/03/2014 2146 11/06/2014 2004 Full Code [id number]  Deneise Lever, MD ED   10/24/2014 2142 10/30/2014 1814 Full Code [id number]  Laverle Hobby, PA-C Inpatient   10/19/2014 0618 10/24/2014 2142 Full Code [id number]  Toy Baker, MD ED   09/11/2014 2139 09/12/2014 1922 Full Code [id number]  Antonietta Breach, PA-C ED   07/06/2014 1728 07/07/2014 1707 Full Code [id number]  Clayton Bibles, PA-C ED   05/04/2014 1303 05/05/2014 1653 Full Code [id number]  Clarene Reamer, MD Inpatient   02/11/2014 1833 02/16/2014 1656 Full Code [id number]  Delfin Gant, NP Inpatient   02/09/2014 1519 02/11/2014 1833 Full Code [id number]  Benjamine Mola, [place] Inpatient   02/09/2014 1323 02/09/2014 1519 Full Code [id number]  Benjamine Mola, [place] ED   02/09/2014 0143 02/09/2014 1323 Full Code [id number]  Martie Lee, PA-C ED   01/05/2014 0906 01/05/2014 1703 Full Code [id number]  Akintayo, Mojeed Inpatient   12/29/2013 1951 12/29/2013 2301 Full Code [id number]  Malena Peer, NP ED   12/13/2013 0324 12/13/2013 1003 Full Code [id number]  Domenic Moras, PA-C ED   12/02/2011 2322 12/08/2011 2325 Full Code [id number]  Hoy Morn, MD ED   11/25/2011 0151 11/25/2011 1451 Full Code [id number]  Abran Richard, PA-C ED   10/30/2011 0012 10/30/2011 0810 Full Code [id number]  Alfonzo Feller, DO ED   06/30/2011 1826 07/16/2011 1418 Full Code [id number]  Paulla Fore, RN Inpatient         IV Access:   Peripheral IV   Procedures and diagnostic studies:   No results found.   Medical  Consultants:   None.   Subjective:    [name] no complaints Objective:    Vitals:   09/14/21 1545 09/14/21 1948 09/14/21 2358 09/15/21 0418  BP: 115/70 115/71 108/85 116/79  Pulse: 93 89 86 91  Resp: 20 16 18 17   Temp: 99.6 F (37.6 C) 98.1 F (36.7 C) 98 F (36.7 C) 98.6 F (37 C)  TempSrc: Oral  Oral   SpO2: 97% 91% 96% 96%  Weight:      Height:       SpO2: 96 %   Intake/Output Summary (Last 24 hours) at 09/15/2021 0737 Last data filed at 09/15/2021 0600 Gross per 24 hour  Intake --  Output 950 ml  Net -950 ml    Filed Weights   09/07/21 0900 09/07/21 1500  Weight: 88.5 kg 94.7 kg    Exam: General exam:  In no acute distress. Respiratory system: Good air movement and clear to auscultation. Cardiovascular system: S1 & S2 heard, RRR. No JVD. Gastrointestinal system: Abdomen is nondistended, soft and nontender.  Extremities: No pedal edema. Skin: No rashes, lesions or ulcers Psychiatry: Judgement and insight appear normal. Mood & affect appropriate.  Data Reviewed:    Labs: Basic Metabolic Panel: Recent Labs  Lab 09/08/21 1000 09/09/21 0303 09/10/21 0258 09/11/21 0250 09/12/21 0245 09/13/21 0249  NA 136 134* 134* 133* 132*  --   K 3.4* 3.8 4.1 3.6 3.7  --   CL 102 104 107 106 104  --   CO2 26 22 17* 20* 19*  --   GLUCOSE 110* 94 82 126* 108*  --   BUN 10 11 11 14 14   --   CREATININE 0.94 0.95 0.86 0.96 0.89  --   CALCIUM 8.1* 8.5* 8.6* 8.6* 8.7*  --   MG  --  1.7 1.8 1.5* 1.6* 1.8  PHOS  --   --   --   --  4.2  --     GFR Estimated Creatinine Clearance: 121.2 mL/min (by C-G formula based on SCr of 0.89 mg/dL). Liver Function Tests: Recent Labs  Lab 09/09/21 0303 09/10/21 0258 09/11/21 0250 09/12/21 0245  AST 283* 180* 136*  --   ALT 98* 101* 94*  --   ALKPHOS 161* 161* 155*  --   BILITOT 2.0* 1.7* 1.3*  --   PROT 6.3* 6.5 6.2*  --   ALBUMIN 3.5 3.2* 3.1* 3.2*    No results for input(s): LIPASE, AMYLASE in the last 168  hours. No results for input(s): AMMONIA in the last 168 hours. Coagulation profile Recent Labs  Lab 09/08/21 1000  INR 1.1    COVID-19 Labs  No results for input(s): DDIMER, FERRITIN, LDH, CRP in the last 72 hours.  Lab Results  Component Value Date   SARSCOV2NAA NEGATIVE 09/07/2021   [place] NEGATIVE 08/26/2021   [place] NEGATIVE 08/05/2020   [place] NEGATIVE 07/16/2020    CBC: Recent Labs  Lab 09/09/21 0303 09/10/21 0258 09/11/21 0250 09/12/21 0245 09/13/21 0249  WBC 9.3 7.8 7.7 7.4 8.0  NEUTROABS 6.6 5.3 4.5 3.8 3.9  HGB 11.4* 12.9* 11.5* 12.2* 11.9*  HCT 34.3* 38.3* 34.0* 37.9* 36.8*  MCV 101.5* 100.8* 101.5* 103.8* 103.1*  PLT 132* 142* 174 191 245    Cardiac Enzymes: No results for input(s): CKTOTAL, CKMB, CKMBINDEX, TROPONINI in the last 168 hours. BNP (last 3 results) No results for input(s): PROBNP in the last 8760 hours. CBG: Recent Labs  Lab 09/08/21 0741 09/15/21 0000  GLUCAP 107* 114*    D-Dimer: No results for input(s): DDIMER in the last 72 hours. Hgb A1c: No results for input(s): HGBA1C in the last 72 hours. Lipid Profile: No results for input(s): CHOL, HDL, LDLCALC, TRIG, CHOLHDL, LDLDIRECT in the last 72 hours. Thyroid function studies: No results for input(s): TSH, T4TOTAL, T3FREE, THYROIDAB in the last 72 hours.  Invalid input(s): FREET3 Anemia work up: No results for input(s): VITAMINB12, FOLATE, FERRITIN, TIBC, IRON, RETICCTPCT in the last 72 hours.  Sepsis Labs: Recent Labs  Lab 09/10/21 0258 09/11/21 0250 09/12/21 0245 09/13/21 0249  WBC 7.8 7.7 7.4 8.0    Microbiology Recent Results (from the past 240 hour(s))  Resp Panel by RT-PCR (Flu A&B, Covid) Nasopharyngeal Swab     Status: None   Collection Time: 09/07/21  7:26 AM   Specimen: Nasopharyngeal Swab; Nasopharyngeal(NP) swabs in vial transport medium  Result Value Ref Range Status   SARS Coronavirus 2 by RT PCR NEGATIVE NEGATIVE Final    Comment:  (NOTE) SARS-CoV-2 target nucleic acids are NOT DETECTED.  The SARS-CoV-2 RNA is generally detectable in upper respiratory specimens during the acute phase of infection. The lowest concentration of SARS-CoV-2 viral copies this assay can detect is 138 copies/mL. A negative result does not preclude SARS-Cov-2 infection and should not be used as the sole basis for treatment or other patient management decisions. A negative result may occur with  improper specimen collection/handling, submission of specimen other than nasopharyngeal swab, presence of viral mutation(s) within the areas targeted by this assay, and inadequate number of viral copies(<138 copies/mL). A negative result must be combined with clinical observations, patient history, and epidemiological information. The expected result is Negative.  Fact Sheet for Patients:  EntrepreneurPulse.com.au  Fact Sheet for Healthcare Providers:  IncredibleEmployment.be  This test is no t yet approved or cleared by the [place] FDA and  has been authorized for detection and/or diagnosis of SARS-CoV-2 by FDA under an Emergency Use Authorization (EUA). This EUA will remain  in effect (meaning this test can be used) for the duration of the COVID-19 declaration under Section 564(b)(1) of the Act, 21 U.S.C.section 360bbb-3(b)(1), unless the authorization is terminated  or revoked sooner.       Influenza A by PCR NEGATIVE NEGATIVE Final   Influenza B by PCR NEGATIVE NEGATIVE Final    Comment: (NOTE) The Xpert Xpress SARS-CoV-2/FLU/RSV plus assay is intended as an aid in the diagnosis of influenza from Nasopharyngeal swab specimens and should not be used as a sole basis for treatment. Nasal washings and aspirates are unacceptable for Xpert Xpress SARS-CoV-2/FLU/RSV testing.  Fact Sheet for Patients: EntrepreneurPulse.com.au  Fact Sheet for Healthcare  Providers: IncredibleEmployment.be  This test is not yet approved or cleared by the [place] FDA and has been authorized for detection and/or diagnosis of SARS-CoV-2 by FDA under an Emergency Use Authorization (EUA). This EUA will remain in effect (meaning this test can be used) for the duration of the COVID-19 declaration under Section 564(b)(1) of the Act, 21 U.S.C. section 360bbb-3(b)(1), unless the authorization is terminated or revoked.  Performed at [facility], [place] [address], [place], [place] [address]   MRSA Next Gen by PCR, Nasal     Status: None   Collection Time: 09/07/21  1:52 PM   Specimen: Nasal Mucosa; Nasal Swab  Result Value Ref Range Status   MRSA by PCR Next Gen NOT DETECTED NOT DETECTED Final    Comment: (NOTE) The GeneXpert MRSA Assay (FDA approved for NASAL specimens only), is one component of a comprehensive MRSA colonization surveillance program. It is not intended to diagnose MRSA infection nor to guide or monitor treatment for MRSA infections. Test performance is not FDA approved in patients less than 8 years old. Performed at [facility], [place] [address] [place], [place] [address]      Medications:    Chlorhexidine Gluconate Cloth  6 each Topical Daily   folic acid  1 mg Oral Daily   gabapentin  300 mg Oral TID   lactulose  10 g Oral TID   levETIRAcetam  500 mg Oral BID   magnesium oxide  800 mg Oral BID   mouth rinse  15 mL Mouth Rinse BID   metoprolol tartrate  25 mg Oral BID   multivitamin with minerals  1 tablet Oral Daily   nicotine  21 mg Transdermal  Daily   pantoprazole  40 mg Oral Daily   QUEtiapine  200 mg Oral QHS   sertraline  25 mg Oral Daily   thiamine  100 mg Oral Daily   Continuous Infusions:      LOS: 8 days   Charlynne Cousins  [facility]  09/15/2021, 7:37 AM

## 2021-09-16 MED ORDER — SPIRONOLACTONE 100 MG PO TABS
100.0000 mg | ORAL_TABLET | Freq: Every day | ORAL | Status: DC
  Administered 2021-09-16 – 2021-09-23 (×8): 100 mg via ORAL
  Filled 2021-09-16 (×8): qty 1

## 2021-09-16 NOTE — Progress Notes (Signed)
Physical Therapy Treatment Patient Details Name: [name] MRN: [id number] DOB: 02/28/1971 Today's Date: 09/16/2021   History of Present Illness [name] is an 51 y.o. male past medical history of alcohol abuse and alcoholic cirrhosis, schizoaffective disorder with seizures who comes in with withdrawal symptoms thinking he may to have a seizure.  Had a witnessed seizure in the ED and was treated with Ativan and loaded with Keppra.    PT Comments    [name] is progressing with PT. Able to sit EOB with improved balance, stand and take a few steps with RW and min assist. He is cooperative and willing to work with PT, having some pain L wrist and L ankle limiting incr gait distance, continue to recommend SNF post acute    Recommendations for follow up therapy are one component of a multi-disciplinary discharge planning process, led by the attending physician.  Recommendations may be updated based on patient status, additional functional criteria and insurance authorization.  Follow Up Recommendations  Skilled nursing-short term rehab (<3 hours/day)     Assistance Recommended at Discharge Frequent or constant Supervision/Assistance  Patient can return home with the following A little help with walking and/or transfers;Assistance with cooking/housework;Direct supervision/assist for medications management;Assist for transportation;Help with stairs or ramp for entrance   Equipment Recommendations  None recommended by PT    Recommendations for Other Services       Precautions / Restrictions Precautions Precautions: Fall Precaution Comments: mild ataxia Restrictions Weight Bearing Restrictions: No     Mobility  Bed Mobility Overal bed mobility: Needs Assistance Bed Mobility: Supine to Sit     Supine to sit: Supervision     General bed mobility comments: for safety    Transfers Overall transfer level: Needs assistance Equipment used: Rolling walker (2 wheels) Transfers:  Sit to/from Stand, Bed to chair/wheelchair/BSC Sit to Stand: Min assist, From elevated surface   Step pivot transfers: Min assist       General transfer comment: cues for sequencing, hand placement, safety, RW position and posture    Ambulation/Gait Ambulation/Gait assistance: Min assist Gait Distance (Feet): 4 Feet Assistive device: Rolling walker (2 wheels) Gait Pattern/deviations: Step-to pattern, Decreased step length - right, Decreased step length - left       General Gait Details: cues for sequence, assist to balance and stabilize with wt shifting, difficulty WBing L ankle d/t pain per   Stairs             Wheelchair Mobility    Modified Rankin (Stroke Patients Only)       Balance   Sitting-balance support: No upper extremity supported, Feet supported Sitting balance-Leahy Scale: Good     Standing balance support: Reliant on assistive device for balance, Bilateral upper extremity supported, During functional activity Standing balance-Leahy Scale: Poor                              Cognition Arousal/Alertness: Awake/alert Behavior During Therapy: WFL for tasks assessed/performed Overall Cognitive Status: Within Functional Limits for tasks assessed                                          Exercises      General Comments        Pertinent Vitals/Pain Pain Assessment Pain Assessment: Faces Faces Pain Scale: Hurts a little bit Pain Location: generalized, feet  and legs, L ankle and L wrist Pain Descriptors / Indicators: Grimacing, Discomfort, Heaviness Pain Intervention(s): Limited activity within patient's tolerance, Monitored during session, Repositioned    Home Living                          Prior Function            PT Goals (current goals can now be found in the care plan section) Acute Rehab PT Goals Patient Stated Goal: to get stronger PT Goal Formulation: With patient Time For Goal  Achievement: 09/26/21 Potential to Achieve Goals: Good Progress towards PT goals: Progressing toward goals    Frequency    Min 2X/week      PT Plan Current plan remains appropriate    Co-evaluation              AM-PAC PT "6 Clicks" Mobility   Outcome Measure  Help needed turning from your back to your side while in a flat bed without using bedrails?: None Help needed moving from lying on your back to sitting on the side of a flat bed without using bedrails?: None Help needed moving to and from a bed to a chair (including a wheelchair)?: A Little Help needed standing up from a chair using your arms (e.g., wheelchair or bedside chair)?: A Little Help needed to walk in hospital room?: A Little Help needed climbing 3-5 steps with a railing? : A Lot 6 Click Score: 19    End of Session Equipment Utilized During Treatment: Gait belt Activity Tolerance: Patient tolerated treatment well Patient left: in chair;with call bell/phone within reach;with chair alarm set Nurse Communication: Mobility status PT Visit Diagnosis: Muscle weakness (generalized) (M62.81);Difficulty in walking, not elsewhere classified (R26.2)     Time: 7096-2836 PT Time Calculation (min) (ACUTE ONLY): 15 min  Charges:  $Gait Training: 8-22 mins                     Baxter Flattery, PT  Acute Rehab Dept ([place]) [phone] Pager [phone]  09/16/2021    [facility] 09/16/2021, 10:38 AM

## 2021-09-16 NOTE — TOC PASRR Note (Signed)
30 Day Passar Note  RE: [name] Date of Birth: 08/03/1972__   Date:09/16/2021        To Whom It May Concern:  Please be advised that the above-named patient will require a short-term nursing home stay - anticipated 30 days or less for rehabilitation and strengthening.  The plan is for return home.   Evette Cristal, MSW, Marlinda Mike [phone]

## 2021-09-16 NOTE — TOC Progression Note (Signed)
Transition of Care [facility]) - Progression Note   Patient Details  Name: [name] MRN: [id number] Date of Birth: 02-Nov-1970  Transition of Care [facility]) CM/SW [place], LCSW Phone Number: 09/16/2021, 9:35 AM  Clinical Narrative: Initial referral for SNF was faxed out. Patient has only received bed denials at this time due to alcohol use and behavior issues. Clinicals were resubmitted in [place] MUST for a level II PASRR per their request. TOC awaiting bed offers and PASRR number.  Expected Discharge Plan: Skilled Nursing Facility Barriers to Discharge: [name]), Active Substance Use - Placement, Continued Medical Work up  Expected Discharge Plan and Services Expected Discharge Plan: [place] In-house Referral: Clinical Social Work  Readmission Risk Interventions Readmission Risk Prevention Plan 01/15/2019  Medication Screening Complete  Transportation Screening Complete  Some recent data might be hidden

## 2021-09-16 NOTE — Progress Notes (Signed)
[facility] PROGRESS NOTE    Progress Note  Tarrance Januszewski  WIO:[id number] DOB: 12-13-1970 DOA: 09/07/2021 PCP: [name], MD     Brief Narrative:   [name] is an 51 y.o. male past medical history of alcohol abuse and alcoholic cirrhosis, schizoaffective disorder with seizures who comes in with withdrawal symptoms thinking he may to have a seizure.  Had a witnessed seizure in the ED and was treated with Ativan and loaded with Keppra.  Had to be started on Precedex, did fairly well overnight.  Has been slowly improving off Precedex started on Librium orally which she has been tolerating. Tolerating his diet.   Assessment/Plan:   Alcohol withdrawal seizure with complication [facility]): Continue the Librium taper. Were currently awaiting skilled nursing facility placement. No events overnight.  Hypokalemia Potassium still 3.7, replete orally mag 1.6, will continue oral repletion of magnesium.  Hepatic steatosis: Noted on CT.  Macrocytic anemia: Continue thiamine and folate. Likely due to alcohol abuse.  Essential hypertension: Resume Aldactone Continue to use labetalol  GERD: Continue PPI.  Schizoaffective disorder: Continue Seroquel and Zoloft.  Sacral decubitus ulcer present on admission:   DVT prophylaxis: lovenox Family Communication:none Status is: Inpatient  Remains inpatient appropriate because: Alcohol withdrawal seizures.        Code Status:     Code Status Orders  (From admission, onward)           Start     Ordered   09/07/21 1215  Full code  Continuous        09/07/21 1214           Code Status History     Date Active Date Inactive Code Status Order ID Comments User Context   08/26/2021 0506 08/27/2021 1857 Full Code [id number]  Rayna Sexton, PA-C ED   07/16/2020 1752 08/06/2020 2031 Full Code [id number]  Iona Beard, MD ED   05/03/2020 0400 05/07/2020 1856 Full Code [id number]  Chotiner, Yevonne Aline, MD ED    10/05/2019 1645 10/07/2019 1845 Full Code [id number]  Asencion Noble, MD ED   03/16/2019 1933 03/20/2019 1518 Full Code [id number]  Shela Leff, MD ED   03/02/2019 0541 03/08/2019 1641 Full Code [id number]  Germain Osgood, PA-C ED   01/10/2019 2229 01/15/2019 1933 Full Code [id number]  Ivor Costa, MD ED   10/05/2018 0402 10/05/2018 1755 Full Code [id number]  Ward, Delice Bison, DO ED   06/02/2018 1914 06/08/2018 1626 Full Code [id number]  Suella Broad, FNP Inpatient   06/01/2018 0708 06/02/2018 1825 Full Code [id number]  Ripley Fraise, MD ED   02/22/2016 1928 02/23/2016 1510 Full Code [id number]  Patrecia Pour, NP Inpatient   10/10/2015 1408 10/11/2015 1436 Full Code [id number]  Leonard Schwartz, MD ED   09/30/2015 1656 10/04/2015 2154 Full Code [id number]  Debbe Odea, MD ED   06/29/2015 1426 06/30/2015 1902 Full Code [id number]  Kerrie Buffalo, NP Inpatient   06/28/2015 2339 06/29/2015 0928 Full Code [id number]  Antonietta Breach, PA-C ED   06/17/2015 1237 06/19/2015 1614 Full Code [id number]  Lurena Nida, NP Inpatient   06/16/2015 1633 06/17/2015 1237 Full Code [id number]  Merrily Pew, MD ED   05/13/2015 1353 05/14/2015 1700 Full Code [id number]  Niel Hummer, NP Inpatient   05/13/2015 0727 05/13/2015 1353 Full Code [id number]  Lacretia Leigh, MD ED   05/05/2015 1932 05/06/2015 1552 Full Code [id number]  Patrecia Pour, NP Inpatient   05/05/2015 1852 05/05/2015 1932 Full Code [id number]  Delfin Gant, NP Inpatient  05/05/2015 0233 05/05/2015 1852 Full Code [id number]  Alvina Chou, PA-C ED   04/23/2015 1814 04/25/2015 1735 Full Code [id number]  Junius Creamer, NP ED   04/08/2015 0737 04/09/2015 2242 Full Code [id number]  Dorie Rank, MD ED   02/28/2015 1530 03/01/2015 1708 Full Code [id number]  Benjamine Mola, [place] Inpatient   02/28/2015 0544 02/28/2015 1530 Full Code [id number]  Charlann Lange, PA-C ED   02/03/2015 1321 02/09/2015 2125 Full Code [id number]  Encarnacion Slates, NP Inpatient   02/03/2015 0111 02/03/2015  1321 Full Code [id number]  Anton[place]ta Breach, PA-C ED   12/25/2014 1638 12/30/2014 1725 Full Code [id number]  Benjamine Mola, [place] Inpatient   12/25/2014 1357 12/25/2014 1638 Full Code [id number]  Benjamine Mola, [place] Inpatient   12/24/2014 0751 12/25/2014 0429 Full Code [id number]  Carlisle Cater, PA-C ED   11/29/2014 1743 12/06/2014 1859 Full Code [id number]  Patrecia Pour, NP Inpatient   11/28/2014 1142 11/29/2014 1743 Full Code [id number]  Margarita Mail, PA-C ED   11/03/2014 2146 11/06/2014 2004 Full Code [id number]  Deneise Lever, MD ED   10/24/2014 2142 10/30/2014 1814 Full Code [id number]  Laverle Hobby, PA-C Inpatient   10/19/2014 0618 10/24/2014 2142 Full Code [id number]  Toy Baker, MD ED   09/11/2014 2139 09/12/2014 1922 Full Code [id number]  Antonietta Breach, PA-C ED   07/06/2014 1728 07/07/2014 1707 Full Code [id number]  Clayton Bibles, PA-C ED   05/04/2014 1303 05/05/2014 1653 Full Code [id number]  Clarene Reamer, MD Inpatient   02/11/2014 1833 02/16/2014 1656 Full Code [id number]  Delfin Gant, NP Inpatient   02/09/2014 1519 02/11/2014 1833 Full Code [id number]  Benjamine Mola, [place] Inpatient   02/09/2014 1323 02/09/2014 1519 Full Code [id number]  Benjamine Mola, [place] ED   02/09/2014 0143 02/09/2014 1323 Full Code [id number]  Martie Lee, PA-C ED   01/05/2014 0906 01/05/2014 1703 Full Code [id number]  Akintayo, [place] Inpatient   12/29/2013 1951 12/29/2013 2301 Full Code [id number]  Malena Peer, NP ED   12/13/2013 0324 12/13/2013 1003 Full Code [id number]  Domenic Moras, PA-C ED   12/02/2011 2322 12/08/2011 2325 Full Code [id number]  Hoy Morn, MD ED   11/25/2011 0151 11/25/2011 1451 Full Code [id number]  Abran Richard, PA-C ED   10/30/2011 0012 10/30/2011 0810 Full Code [id number]  Alfonzo Feller, DO ED   06/30/2011 1826 07/16/2011 1418 Full Code [id number]  Paulla Fore, RN Inpatient         IV Access:   Peripheral IV   Procedures and diagnostic studies:   No results found.   Medical  Consultants:   None.   Subjective:    [name] no complaints Objective:    Vitals:   09/15/21 0418 09/15/21 1421 09/15/21 2052 09/16/21 0617  BP: 116/79 124/78 119/77 118/81  Pulse: 91 86 95 91  Resp: 17 18 18 18   Temp: 98.6 F (37 C) 98.3 F (36.8 C) 99.1 F (37.3 C) 99.3 F (37.4 C)  TempSrc:  Oral Oral Oral  SpO2: 96% 96% 92% 95%  Weight:      Height:       SpO2: 95 %   Intake/Output Summary (Last 24 hours) at 09/16/2021 1018 Last data filed at 09/16/2021 0905 Gross per 24 hour  Intake 1420 ml  Output 2650 ml  Net -1230 ml    Filed Weights   09/07/21 0900 09/07/21 1500  Weight: 88.5 kg 94.7 kg    Exam: General exam: In no  acute distress. Respiratory system: Good air movement and clear to auscultation. Cardiovascular system: S1 & S2 heard, RRR. No JVD. Gastrointestinal system: Abdomen is nondistended, soft and nontender.  Extremities: No pedal edema. Skin: No rashes, lesions or ulcers Psychiatry: Judgement and insight appear normal. Mood & affect appropriate.  Data Reviewed:    Labs: Basic Metabolic Panel: Recent Labs  Lab 09/10/21 0258 09/11/21 0250 09/12/21 0245 09/13/21 0249  NA 134* 133* 132*  --   K 4.1 3.6 3.7  --   CL 107 106 104  --   CO2 17* 20* 19*  --   GLUCOSE 82 126* 108*  --   BUN 11 14 14   --   CREATININE 0.86 0.96 0.89  --   CALCIUM 8.6* 8.6* 8.7*  --   MG 1.8 1.5* 1.6* 1.8  PHOS  --   --  4.2  --     GFR Estimated Creatinine Clearance: 121.2 mL/min (by C-G formula based on SCr of 0.89 mg/dL). Liver Function Tests: Recent Labs  Lab 09/10/21 0258 09/11/21 0250 09/12/21 0245  AST 180* 136*  --   ALT 101* 94*  --   ALKPHOS 161* 155*  --   BILITOT 1.7* 1.3*  --   PROT 6.5 6.2*  --   ALBUMIN 3.2* 3.1* 3.2*    No results for input(s): LIPASE, AMYLASE in the last 168 hours. No results for input(s): AMMONIA in the last 168 hours. Coagulation profile No results for input(s): INR, PROTIME in the last 168  hours.  COVID-19 Labs  No results for input(s): DDIMER, FERRITIN, LDH, CRP in the last 72 hours.  Lab Results  Component Value Date   SARSCOV2NAA NEGATIVE 09/07/2021   [place] NEGATIVE 08/26/2021   [place] NEGATIVE 08/05/2020   [place] NEGATIVE 07/16/2020    CBC: Recent Labs  Lab 09/10/21 0258 09/11/21 0250 09/12/21 0245 09/13/21 0249  WBC 7.8 7.7 7.4 8.0  NEUTROABS 5.3 4.5 3.8 3.9  HGB 12.9* 11.5* 12.2* 11.9*  HCT 38.3* 34.0* 37.9* 36.8*  MCV 100.8* 101.5* 103.8* 103.1*  PLT 142* 174 191 245    Cardiac Enzymes: No results for input(s): CKTOTAL, CKMB, CKMBINDEX, TROPONINI in the last 168 hours. BNP (last 3 results) No results for input(s): PROBNP in the last 8760 hours. CBG: Recent Labs  Lab 09/15/21 0000  GLUCAP 114*    D-Dimer: No results for input(s): DDIMER in the last 72 hours. Hgb A1c: No results for input(s): HGBA1C in the last 72 hours. Lipid Profile: No results for input(s): CHOL, HDL, LDLCALC, TRIG, CHOLHDL, LDLDIRECT in the last 72 hours. Thyroid function studies: No results for input(s): TSH, T4TOTAL, T3FREE, THYROIDAB in the last 72 hours.  Invalid input(s): FREET3 Anemia work up: No results for input(s): VITAMINB12, FOLATE, FERRITIN, TIBC, IRON, RETICCTPCT in the last 72 hours.  Sepsis Labs: Recent Labs  Lab 09/10/21 0258 09/11/21 0250 09/12/21 0245 09/13/21 0249  WBC 7.8 7.7 7.4 8.0    Microbiology Recent Results (from the past 240 hour(s))  Resp Panel by RT-PCR (Flu A&B, Covid) Nasopharyngeal Swab     Status: None   Collection Time: 09/07/21  7:26 AM   Specimen: Nasopharyngeal Swab; Nasopharyngeal(NP) swabs in vial transport medium  Result Value Ref Range Status   SARS Coronavirus 2 by RT PCR NEGATIVE NEGATIVE Final    Comment: (NOTE) SARS-CoV-2 target nucleic acids are NOT DETECTED.  The SARS-CoV-2 RNA is generally detectable in upper respiratory specimens during the acute phase of infection. The  lowest concentration of SARS-CoV-2  viral copies this assay can detect is 138 copies/mL. A negative result does not preclude SARS-Cov-2 infection and should not be used as the sole basis for treatment or other patient management decisions. A negative result may occur with  improper specimen collection/handling, submission of specimen other than nasopharyngeal swab, presence of viral mutation(s) within the areas targeted by this assay, and inadequate number of viral copies(<138 copies/mL). A negative result must be combined with clinical observations, patient history, and epidemiological information. The expected result is Negative.  Fact Sheet for Patients:  EntrepreneurPulse.com.au  Fact Sheet for Healthcare Providers:  IncredibleEmployment.be  This test is no t yet approved or cleared by the [place] FDA and  has been authorized for detection and/or diagnosis of SARS-CoV-2 by FDA under an Emergency Use Authorization (EUA). This EUA will remain  in effect (meaning this test can be used) for the duration of the COVID-19 declaration under Section 564(b)(1) of the Act, 21 U.S.C.section 360bbb-3(b)(1), unless the authorization is terminated  or revoked sooner.       Influenza A by PCR NEGATIVE NEGATIVE Final   Influenza B by PCR NEGATIVE NEGATIVE Final    Comment: (NOTE) The Xpert Xpress SARS-CoV-2/FLU/RSV plus assay is intended as an aid in the diagnosis of influenza from Nasopharyngeal swab specimens and should not be used as a sole basis for treatment. Nasal washings and aspirates are unacceptable for Xpert Xpress SARS-CoV-2/FLU/RSV testing.  Fact Sheet for Patients: EntrepreneurPulse.com.au  Fact Sheet for Healthcare Providers: IncredibleEmployment.be  This test is not yet approved or cleared by the [place] FDA and has been authorized for detection and/or diagnosis of SARS-CoV-2 by FDA under  an Emergency Use Authorization (EUA). This EUA will remain in effect (meaning this test can be used) for the duration of the COVID-19 declaration under Section 564(b)(1) of the Act, 21 U.S.C. section 360bbb-3(b)(1), unless the authorization is terminated or revoked.  Performed at [facility], [place] [address], [place], [place] [address]   MRSA Next Gen by PCR, Nasal     Status: None   Collection Time: 09/07/21  1:52 PM   Specimen: Nasal Mucosa; Nasal Swab  Result Value Ref Range Status   MRSA by PCR Next Gen NOT DETECTED NOT DETECTED Final    Comment: (NOTE) The GeneXpert MRSA Assay (FDA approved for NASAL specimens only), is one component of a comprehensive MRSA colonization surveillance program. It is not intended to diagnose MRSA infection nor to guide or monitor treatment for MRSA infections. Test performance is not FDA approved in patients less than 61 years old. Performed at [facility], [place] [address], [place], [place] [address]      Medications:    folic acid  1 mg Oral Daily   gabapentin  300 mg Oral TID   lactulose  10 g Oral TID   levETIRAcetam  500 mg Oral BID   mouth rinse  15 mL Mouth Rinse BID   metoprolol tartrate  25 mg Oral BID   multivitamin with minerals  1 tablet Oral Daily   nicotine  21 mg Transdermal Daily   pantoprazole  40 mg Oral Daily   QUEtiapine  200 mg Oral QHS   sertraline  25 mg Oral Daily   thiamine  100 mg Oral Daily   Continuous Infusions:      LOS: 9 days   Charlynne Cousins  [facility]  09/16/2021, 10:18 AM

## 2021-09-16 NOTE — Plan of Care (Signed)
Plan of care reviewed and discussed with the patient. 

## 2021-09-17 NOTE — Care Management Important Message (Signed)
Important Message  Patient Details IM Letter given to the Patient Name: [name] MRN: [id number] Date of Birth: Jul 21, 1971   Medicare Important Message Given:  Yes     Kerin Salen 09/17/2021, 12:27 PM

## 2021-09-17 NOTE — Progress Notes (Signed)
[facility] PROGRESS NOTE    Progress Note  [name]  OXB:[id number] DOB: 15-Oct-1970 DOA: 09/07/2021 PCP: [name], MD     Brief Narrative:   [name] is an 51 y.o. male past medical history of alcohol abuse and alcoholic cirrhosis, schizoaffective disorder with seizures who comes in with withdrawal symptoms thinking he may to have a seizure.  Had a witnessed seizure in the ED and was treated with Ativan and loaded with Keppra.  Had to be started on Precedex, did fairly well overnight.  Has been slowly improving off Precedex started on Librium orally which she has been tolerating. Tolerating his diet.   Assessment/Plan:   Alcohol withdrawal seizure with complication [facility]): Continue the Librium taper. Awaiting skilled nursing facility placement. No events overnight.  Hypokalemia Potassium still 3.7, replete orally mag 1.6, will continue oral repletion of magnesium.  Hepatic steatosis: Noted on CT.  Macrocytic anemia: Continue thiamine and folate. Likely due to alcohol abuse.  Essential hypertension: Resume Aldactone Continue to use labetalol  GERD: Continue PPI.  Schizoaffective disorder: Continue Seroquel and Zoloft.  Sacral decubitus ulcer present on admission:   DVT prophylaxis: lovenox Family Communication:none Status is: Inpatient  Remains inpatient appropriate because: Alcohol withdrawal seizures.        Code Status:     Code Status Orders  (From admission, onward)           Start     Ordered   09/07/21 1215  Full code  Continuous        09/07/21 1214           Code Status History     Date Active Date Inactive Code Status Order ID Comments User Context   08/26/2021 0506 08/27/2021 1857 Full Code [id number]  Rayna Sexton, PA-C ED   07/16/2020 1752 08/06/2020 2031 Full Code [id number]  Iona Beard, MD ED   05/03/2020 0400 05/07/2020 1856 Full Code [id number]  Chotiner, Yevonne Aline, MD ED   10/05/2019 1645  10/07/2019 1845 Full Code [id number]  Asencion Noble, MD ED   03/16/2019 1933 03/20/2019 1518 Full Code [id number]  Shela Leff, MD ED   03/02/2019 0541 03/08/2019 1641 Full Code [id number]  Germain Osgood, PA-C ED   01/10/2019 2229 01/15/2019 1933 Full Code [id number]  Ivor Costa, MD ED   10/05/2018 0402 10/05/2018 1755 Full Code [id number]  Ward, Delice Bison, DO ED   06/02/2018 1914 06/08/2018 1626 Full Code [id number]  Suella Broad, FNP Inpatient   06/01/2018 0708 06/02/2018 1825 Full Code [id number]  Ripley Fraise, MD ED   02/22/2016 1928 02/23/2016 1510 Full Code [id number]  Patrecia Pour, NP Inpatient   10/10/2015 1408 10/11/2015 1436 Full Code [id number]  Leonard Schwartz, MD ED   09/30/2015 1656 10/04/2015 2154 Full Code [id number]  Debbe Odea, MD ED   06/29/2015 1426 06/30/2015 1902 Full Code [id number]  Kerrie Buffalo, NP Inpatient   06/28/2015 2339 06/29/2015 0928 Full Code [id number]  Antonietta Breach, PA-C ED   06/17/2015 1237 06/19/2015 1614 Full Code [id number]  Lurena Nida, NP Inpatient   06/16/2015 1633 06/17/2015 1237 Full Code [id number]  Merrily Pew, MD ED   05/13/2015 1353 05/14/2015 1700 Full Code [id number]  Niel Hummer, NP Inpatient   05/13/2015 0727 05/13/2015 1353 Full Code [id number]  Lacretia Leigh, MD ED   05/05/2015 1932 05/06/2015 1552 Full Code [id number]  Patrecia Pour, NP Inpatient   05/05/2015 1852 05/05/2015 1932 Full Code [id number]  Delfin Gant, NP Inpatient  05/05/2015 0233 05/05/2015 1852 Full Code [id number]  Alvina Chou, PA-C ED   04/23/2015 1814 04/25/2015 1735 Full Code [id number]  Junius Creamer, NP ED   04/08/2015 0737 04/09/2015 2242 Full Code [id number]  Dorie Rank, MD ED   02/28/2015 1530 03/01/2015 1708 Full Code [id number]  Benjamine Mola, [place] Inpatient   02/28/2015 0544 02/28/2015 1530 Full Code [id number]  Charlann Lange, PA-C ED   02/03/2015 1321 02/09/2015 2125 Full Code [id number]  Encarnacion Slates, NP Inpatient   02/03/2015 0111 02/03/2015 1321 Full Code  [id number]  Antonietta Breach, PA-C ED   12/25/2014 1638 12/30/2014 1725 Full Code [id number]  Benjamine Mola, [place] Inpatient   12/25/2014 1357 12/25/2014 1638 Full Code [id number]  Benjamine Mola, [place] Inpatient   12/24/2014 0751 12/25/2014 0429 Full Code [id number]  Carlisle Cater, PA-C ED   11/29/2014 1743 12/06/2014 1859 Full Code [id number]  Patrecia Pour, NP Inpatient   11/28/2014 1142 11/29/2014 1743 Full Code [id number]  Margarita Mail, PA-C ED   11/03/2014 2146 11/06/2014 2004 Full Code [id number]  Deneise Lever, MD ED   10/24/2014 2142 10/30/2014 1814 Full Code [id number]  Laverle Hobby, PA-C Inpatient   10/19/2014 0618 10/24/2014 2142 Full Code [id number]  Toy Baker, MD ED   09/11/2014 2139 09/12/2014 1922 Full Code [id number]  Antonietta Breach, PA-C ED   07/06/2014 1728 07/07/2014 1707 Full Code [id number]  Clayton Bibles, PA-C ED   05/04/2014 1303 05/05/2014 1653 Full Code [id number]  Clarene Reamer, MD Inpatient   02/11/2014 1833 02/16/2014 1656 Full Code [id number]  Delfin Gant, NP Inpatient   02/09/2014 1519 02/11/2014 1833 Full Code [id number]  Benjamine Mola, [place] Inpatient   02/09/2014 1323 02/09/2014 1519 Full Code [id number]  Benjamine Mola, [place] ED   02/09/2014 0143 02/09/2014 1323 Full Code [id number]  Martie Lee, PA-C ED   01/05/2014 0906 01/05/2014 1703 Full Code [id number]  Akintayo, [place] Inpatient   12/29/2013 1951 12/29/2013 2301 Full Code [id number]  Malena Peer, NP ED   12/13/2013 0324 12/13/2013 1003 Full Code [id number]  Domenic Moras, PA-C ED   12/02/2011 2322 12/08/2011 2325 Full Code [id number]  Hoy Morn, MD ED   11/25/2011 0151 11/25/2011 1451 Full Code [id number]  Abran Richard, PA-C ED   10/30/2011 0012 10/30/2011 0810 Full Code [id number]  Alfonzo Feller, DO ED   06/30/2011 1826 07/16/2011 1418 Full Code [id number]  Paulla Fore, RN Inpatient         IV Access:   Peripheral IV   Procedures and diagnostic studies:   No results found.   Medical Consultants:    None.   Subjective:    Luc Shammas no complaints Objective:    Vitals:   09/16/21 0617 09/16/21 1354 09/16/21 2024 09/17/21 0638  BP: 118/81 113/73 105/74 104/66  Pulse: 91 88 80 84  Resp: 18 16 20 20   Temp: 99.3 F (37.4 C) 98.3 F (36.8 C) 98.2 F (36.8 C) 99 F (37.2 C)  TempSrc: Oral Oral Oral Oral  SpO2: 95% 93% 94% 95%  Weight:      Height:       SpO2: 95 %   Intake/Output Summary (Last 24 hours) at 09/17/2021 1035 Last data filed at 09/17/2021 0959 Gross per 24 hour  Intake 340 ml  Output 1525 ml  Net -1185 ml    Filed Weights   09/07/21 0900 09/07/21 1500  Weight: 88.5 kg 94.7 kg    Exam: General exam: In no  acute distress. Respiratory system: Good air movement and clear to auscultation. Cardiovascular system: S1 & S2 heard, RRR. No JVD. Gastrointestinal system: Abdomen is nondistended, soft and nontender.  Extremities: No pedal edema. Skin: No rashes, lesions or ulcers Psychiatry: Judgement and insight appear normal. Mood & affect appropriate.  Data Reviewed:    Labs: Basic Metabolic Panel: Recent Labs  Lab 09/11/21 0250 09/12/21 0245 09/13/21 0249  NA 133* 132*  --   K 3.6 3.7  --   CL 106 104  --   CO2 20* 19*  --   GLUCOSE 126* 108*  --   BUN 14 14  --   CREATININE 0.96 0.89  --   CALCIUM 8.6* 8.7*  --   MG 1.5* 1.6* 1.8  PHOS  --  4.2  --     GFR Estimated Creatinine Clearance: 121.2 mL/min (by C-G formula based on SCr of 0.89 mg/dL). Liver Function Tests: Recent Labs  Lab 09/11/21 0250 09/12/21 0245  AST 136*  --   ALT 94*  --   ALKPHOS 155*  --   BILITOT 1.3*  --   PROT 6.2*  --   ALBUMIN 3.1* 3.2*    No results for input(s): LIPASE, AMYLASE in the last 168 hours. No results for input(s): AMMONIA in the last 168 hours. Coagulation profile No results for input(s): INR, PROTIME in the last 168 hours.  COVID-19 Labs  No results for input(s): DDIMER, FERRITIN, LDH, CRP in the last 72 hours.  Lab Results   Component Value Date   SARSCOV2NAA NEGATIVE 09/07/2021   [place] NEGATIVE 08/26/2021   [place] NEGATIVE 08/05/2020   [place] NEGATIVE 07/16/2020    CBC: Recent Labs  Lab 09/11/21 0250 09/12/21 0245 09/13/21 0249  WBC 7.7 7.4 8.0  NEUTROABS 4.5 3.8 3.9  HGB 11.5* 12.2* 11.9*  HCT 34.0* 37.9* 36.8*  MCV 101.5* 103.8* 103.1*  PLT 174 191 245    Cardiac Enzymes: No results for input(s): CKTOTAL, CKMB, CKMBINDEX, TROPONINI in the last 168 hours. BNP (last 3 results) No results for input(s): PROBNP in the last 8760 hours. CBG: Recent Labs  Lab 09/15/21 0000  GLUCAP 114*    D-Dimer: No results for input(s): DDIMER in the last 72 hours. Hgb A1c: No results for input(s): HGBA1C in the last 72 hours. Lipid Profile: No results for input(s): CHOL, HDL, LDLCALC, TRIG, CHOLHDL, LDLDIRECT in the last 72 hours. Thyroid function studies: No results for input(s): TSH, T4TOTAL, T3FREE, THYROIDAB in the last 72 hours.  Invalid input(s): FREET3 Anemia work up: No results for input(s): VITAMINB12, FOLATE, FERRITIN, TIBC, IRON, RETICCTPCT in the last 72 hours.  Sepsis Labs: Recent Labs  Lab 09/11/21 0250 09/12/21 0245 09/13/21 0249  WBC 7.7 7.4 8.0    Microbiology Recent Results (from the past 240 hour(s))  MRSA Next Gen by PCR, Nasal     Status: None   Collection Time: 09/07/21  1:52 PM   Specimen: Nasal Mucosa; Nasal Swab  Result Value Ref Range Status   MRSA by PCR Next Gen NOT DETECTED NOT DETECTED Final    Comment: (NOTE) The GeneXpert MRSA Assay (FDA approved for NASAL specimens only), is one component of a comprehensive MRSA colonization surveillance program. It is not intended to diagnose MRSA infection nor to guide or monitor treatment for MRSA infections. Test performance is not FDA approved in patients less than 54 years old. Performed at [facility], [place] [address] [place], [place] [address]      Medications:  folic  acid  1 mg Oral Daily   gabapentin  300 mg Oral TID   lactulose  10 g Oral TID   levETIRAcetam  500 mg Oral BID   mouth rinse  15 mL Mouth Rinse BID   metoprolol tartrate  25 mg Oral BID   multivitamin with minerals  1 tablet Oral Daily   nicotine  21 mg Transdermal Daily   pantoprazole  40 mg Oral Daily   QUEtiapine  200 mg Oral QHS   sertraline  25 mg Oral Daily   spironolactone  100 mg Oral Daily   thiamine  100 mg Oral Daily   Continuous Infusions:      LOS: 10 days   Charlynne Cousins  [facility]  09/17/2021, 10:35 AM

## 2021-09-17 NOTE — Progress Notes (Signed)
Occupational Therapy Treatment Patient Details Name: [name] MRN: [id number] DOB: June 27, 1971 Today's Date: 09/17/2021   History of present illness [name] is an 51 y.o. male past medical history of alcohol abuse and alcoholic cirrhosis, schizoaffective disorder with seizures who comes in with withdrawal symptoms thinking he may to have a seizure.  Had a witnessed seizure in the ED and was treated with Ativan and loaded with Keppra.   OT comments  Patient demonstrates ability to don B socks long sitting in bed. Patient min A to power up to standing from elevated bed height, limited assist with L UE due to wrist pain 2* gout. Patient needing verbal cues to correct posture and obtain static standing balance before attempting side stepping to recliner chair. Patient needs increased time and min A for stability to complete transfer. Patient set up A to perform grooming/hygiene tasks in chair.    Recommendations for follow up therapy are one component of a multi-disciplinary discharge planning process, led by the attending physician.  Recommendations may be updated based on patient status, additional functional criteria and insurance authorization.    Follow Up Recommendations  Skilled nursing-short term rehab (<3 hours/day)    Assistance Recommended at Discharge Frequent or constant Supervision/Assistance  Patient can return home with the following  A little help with walking and/or transfers;A little help with bathing/dressing/bathroom;Assistance with cooking/housework   Equipment Recommendations  None recommended by OT       Precautions / Restrictions Precautions Precautions: Fall Precaution Comments: mild ataxia Restrictions Weight Bearing Restrictions: No       Mobility Bed Mobility Overal bed mobility: Modified Independent                     Balance Overall balance assessment: Needs assistance Sitting-balance support: Feet supported Sitting balance-Leahy  Scale: Good     Standing balance support: Reliant on assistive device for balance Standing balance-Leahy Scale: Poor                             ADL either performed or assessed with clinical judgement   ADL Overall ADL's : Needs assistance/impaired     Grooming: Oral care;Wash/dry face;Wash/dry hands;Brushing hair;Set up;Sitting Grooming Details (indicate cue type and reason): Did not attempt at sink side as patient having difficulty weight shifting relying heavily on walker for external support to take steps to recliner chair             Lower Body Dressing: Set up;Bed level Lower Body Dressing Details (indicate cue type and reason): Patient able to don B socks long sitting in bed Toilet Transfer: Minimal assistance;Cueing for safety;Stand-pivot;Rolling walker (2 wheels) Toilet Transfer Details (indicate cue type and reason): To recliner chair, min A to stabilize.         Functional mobility during ADLs: Minimal assistance;Rolling walker (2 wheels);Cueing for safety        Cognition Arousal/Alertness: Awake/alert Behavior During Therapy: WFL for tasks assessed/performed Overall Cognitive Status: Within Functional Limits for tasks assessed                                                     Pertinent Vitals/ Pain       Pain Assessment Pain Assessment: Faces Faces Pain Scale: Hurts little more Pain Location: bilateral ankles and  wrists 2* gout, L>R Pain Descriptors / Indicators: Grimacing Pain Intervention(s): Monitored during session         Frequency  Min 2X/week        Progress Toward Goals  OT Goals(current goals can now be found in the care plan section)  Progress towards OT goals: Progressing toward goals  Acute Rehab OT Goals Patient Stated Goal: rehab get stronger OT Goal Formulation: With patient Time For Goal Achievement: 09/26/21 Potential to Achieve Goals: Good ADL Goals Pt Will Perform Lower Body  Dressing: with supervision;sit to/from stand Pt Will Transfer to Toilet: with supervision;ambulating;grab bars Pt Will Perform Toileting - Clothing Manipulation and hygiene: with supervision;sit to/from stand Additional ADL Goal #1: Patient will stand at sink to perform grooming task as evidence of improving activity tolerance  Plan Discharge plan remains appropriate       AM-PAC OT "6 Clicks" Daily Activity     Outcome Measure   Help from another person eating meals?: A Little Help from another person taking care of personal grooming?: A Little Help from another person toileting, which includes using toliet, bedpan, or urinal?: A Lot Help from another person bathing (including washing, rinsing, drying)?: A Lot Help from another person to put on and taking off regular upper body clothing?: A Little Help from another person to put on and taking off regular lower body clothing?: A Little 6 Click Score: 16    End of Session Equipment Utilized During Treatment: Gait belt;Rolling walker (2 wheels)  OT Visit Diagnosis: Unsteadiness on feet (R26.81);Muscle weakness (generalized) (M62.81)   Activity Tolerance Patient tolerated treatment well   Patient Left in chair;with call bell/phone within reach;with chair alarm set   Nurse Communication Mobility status        Time: 9528-4132 OT Time Calculation (min): 19 min  Charges: OT General Charges $OT Visit: 1 Visit OT Treatments $Self Care/Home Management : 8-22 mins  [name] OT OT pager: [phone]   [name] 09/17/2021, 12:26 PM

## 2021-09-17 NOTE — Progress Notes (Signed)
Physical Therapy Treatment Patient Details Name: [name] MRN: [id number] DOB: 1970-10-01 Today's Date: 09/17/2021   History of Present Illness [name] is an 51 y.o. male past medical history of alcohol abuse and alcoholic cirrhosis, schizoaffective disorder with seizures who comes in with withdrawal symptoms thinking he may to have a seizure.  Had a witnessed seizure in the ED and was treated with Ativan and loaded with Keppra.    PT Comments    Pt progressing toward goals. Increasing activity tolerance. Amb ~ 11'x2 with RW, min assist, seated rest between distances d/t pain and fatigue. Able to don and doff his own socks today at bed level.  Continue to recommend SNF, continue to follow .   Recommendations for follow up therapy are one component of a multi-disciplinary discharge planning process, led by the attending physician.  Recommendations may be updated based on patient status, additional functional criteria and insurance authorization.  Follow Up Recommendations  Skilled nursing-short term rehab (<3 hours/day)     Assistance Recommended at Discharge Frequent or constant Supervision/Assistance  Patient can return home with the following A little help with walking and/or transfers;Assistance with cooking/housework;Direct supervision/assist for medications management;Assist for transportation;Help with stairs or ramp for entrance   Equipment Recommendations  None recommended by PT    Recommendations for Other Services       Precautions / Restrictions Precautions Precautions: Fall Precaution Comments: mild ataxia Restrictions Weight Bearing Restrictions: No     Mobility  Bed Mobility Overal bed mobility: Modified Independent Bed Mobility: Sit to Supine, Supine to Sit                Transfers Overall transfer level: Needs assistance Equipment used: Rolling walker (2 wheels) Transfers: Sit to/from Stand Sit to Stand: Min guard, Min assist            General transfer comment: cues for sequencing, hand placement, LE position. light assist to power up from recliner, min/guard to stand from bed    Ambulation/Gait Ambulation/Gait assistance: Min assist Gait Distance (Feet): 10 Feet (x2) Assistive device: Rolling walker (2 wheels) Gait Pattern/deviations: Step-to pattern, Decreased weight shift to left, Wide base of support, Trunk flexed       General Gait Details: cues for sequence, assist to balance and stabilize with wt shifting, able to tol incr wt L foot   Stairs             Wheelchair Mobility    Modified Rankin (Stroke Patients Only)       Balance     Sitting balance-Leahy Scale: Good     Standing balance support: Reliant on assistive device for balance Standing balance-Leahy Scale: Poor                              Cognition Arousal/Alertness: Awake/alert Behavior During Therapy: WFL for tasks assessed/performed Overall Cognitive Status: Within Functional Limits for tasks assessed                                 General Comments: L wrist with notable decr edema        Exercises General Exercises - Lower Extremity Toe Raises: 5 reps, Both, Seated, AROM, Limitations Toe Raises Limitations: L ankle pain    General Comments        Pertinent Vitals/Pain Pain Assessment Pain Assessment: Faces Faces Pain Scale: Hurts little more Pain Location:  bilateral ankles and wrists 2* gout, L>R Pain Descriptors / Indicators: Grimacing, Sore Pain Intervention(s): Limited activity within patient's tolerance, Monitored during session, Repositioned    Home Living                          Prior Function            PT Goals (current goals can now be found in the care plan section) Acute Rehab PT Goals Patient Stated Goal: to get stronger PT Goal Formulation: With patient Time For Goal Achievement: 09/26/21 Potential to Achieve Goals: Good Progress towards PT goals:  Progressing toward goals    Frequency    Min 2X/week      PT Plan Current plan remains appropriate    Co-evaluation              AM-PAC PT "6 Clicks" Mobility   Outcome Measure  Help needed turning from your back to your side while in a flat bed without using bedrails?: None Help needed moving from lying on your back to sitting on the side of a flat bed without using bedrails?: None Help needed moving to and from a bed to a chair (including a wheelchair)?: A Little Help needed standing up from a chair using your arms (e.g., wheelchair or bedside chair)?: A Little Help needed to walk in hospital room?: A Little Help needed climbing 3-5 steps with a railing? : A Lot 6 Click Score: 19    End of Session Equipment Utilized During Treatment: Gait belt Activity Tolerance: Patient tolerated treatment well Patient left: in bed;with call bell/phone within reach;with bed alarm set Nurse Communication: Mobility status PT Visit Diagnosis: Muscle weakness (generalized) (M62.81);Difficulty in walking, not elsewhere classified (R26.2)     Time: 5790-3833 PT Time Calculation (min) (ACUTE ONLY): 18 min  Charges:  $Gait Training: 8-22 mins                     Baxter Flattery, PT  Acute Rehab Dept ([place]) [phone] Pager [phone]  09/17/2021    [facility] 09/17/2021, 3:58 PM

## 2021-09-18 DIAGNOSIS — F10931 Alcohol use, unspecified with withdrawal delirium: Secondary | ICD-10-CM

## 2021-09-18 LAB — COMPREHENSIVE METABOLIC PANEL
ALT: 99 U/L — ABNORMAL HIGH (ref 0–44)
AST: 78 U/L — ABNORMAL HIGH (ref 15–41)
Albumin: 3.5 g/dL (ref 3.5–5.0)
Alkaline Phosphatase: 180 U/L — ABNORMAL HIGH (ref 38–126)
Anion gap: 9 (ref 5–15)
BUN: 17 mg/dL (ref 6–20)
CO2: 26 mmol/L (ref 22–32)
Calcium: 9.9 mg/dL (ref 8.9–10.3)
Chloride: 98 mmol/L (ref 98–111)
Creatinine, Ser: 0.96 mg/dL (ref 0.61–1.24)
GFR, Estimated: >60 mL/min (ref >60)
Glucose, Bld: 109 mg/dL — ABNORMAL HIGH (ref 70–99)
Potassium: 4.5 mmol/L (ref 3.5–5.1)
Sodium: 133 mmol/L — ABNORMAL LOW (ref 135–145)
Total Bilirubin: 0.8 mg/dL (ref 0.3–1.2)
Total Protein: 7.7 g/dL (ref 6.5–8.1)

## 2021-09-18 LAB — CBC
HCT: 37.1 % — ABNORMAL LOW (ref 39.0–52.0)
Hemoglobin: 12 g/dL — ABNORMAL LOW (ref 13.0–17.0)
MCH: 33.1 pg (ref 26.0–34.0)
MCHC: 32.3 g/dL (ref 30.0–36.0)
MCV: 102.2 fL — ABNORMAL HIGH (ref 80.0–100.0)
Platelets: 636 10*3/uL — ABNORMAL HIGH (ref 150–400)
RBC: 3.63 MIL/uL — ABNORMAL LOW (ref 4.22–5.81)
RDW: 16.1 % — ABNORMAL HIGH (ref 11.5–15.5)
WBC: 10.4 10*3/uL (ref 4.0–10.5)
nRBC: 0 % (ref 0.0–0.2)

## 2021-09-18 LAB — SEDIMENTATION RATE: Sed Rate: 98 mm/hr — ABNORMAL HIGH (ref 0–16)

## 2021-09-18 LAB — URIC ACID: Uric Acid, Serum: 7.3 mg/dL (ref 3.7–8.6)

## 2021-09-18 MED ORDER — LORAZEPAM 1 MG PO TABS
1.0000 mg | ORAL_TABLET | ORAL | Status: AC | PRN
  Administered 2021-09-19 – 2021-09-20 (×4): 2 mg via ORAL
  Administered 2021-09-20 (×2): 1 mg via ORAL
  Administered 2021-09-20 (×2): 2 mg via ORAL
  Administered 2021-09-20: 1 mg via ORAL
  Administered 2021-09-21 (×3): 2 mg via ORAL
  Filled 2021-09-18 (×4): qty 2
  Filled 2021-09-18: qty 1
  Filled 2021-09-18: qty 2
  Filled 2021-09-18: qty 1
  Filled 2021-09-18 (×3): qty 2
  Filled 2021-09-18: qty 1
  Filled 2021-09-18: qty 2

## 2021-09-18 MED ORDER — PREDNISOLONE 5 MG PO TABS
40.0000 mg | ORAL_TABLET | Freq: Every day | ORAL | Status: DC
  Administered 2021-09-18 – 2021-09-20 (×3): 40 mg via ORAL
  Filled 2021-09-18 (×3): qty 8

## 2021-09-18 MED ORDER — ENOXAPARIN SODIUM 40 MG/0.4ML IJ SOSY
40.0000 mg | PREFILLED_SYRINGE | INTRAMUSCULAR | Status: DC
  Administered 2021-09-18 – 2021-09-22 (×5): 40 mg via SUBCUTANEOUS
  Filled 2021-09-18 (×5): qty 0.4

## 2021-09-18 MED ORDER — LORAZEPAM 2 MG/ML IJ SOLN: 1.0000 mg | INTRAMUSCULAR | Status: AC | PRN

## 2021-09-18 NOTE — Plan of Care (Signed)
°  Problem: Coping: Goal: Ability to adjust to condition or change in health will improve Outcome: Progressing   Problem: Health Behavior/Discharge Planning: Goal: Compliance with prescribed medication regimen will improve Outcome: Progressing   Problem: Safety: Goal: Verbalization of understanding the information provided will improve Outcome: Progressing

## 2021-09-18 NOTE — Progress Notes (Addendum)
[facility]  PROGRESS NOTE  [name] QAS:[id number] DOB: [birth year] DOA: 09/07/2021 PCP: [name], MD   Brief HPI:   51 year old male with past medical history of alcohol abuse, alcoholic cirrhosis, schizoaffective disorder with seizures who came with acute withdrawal symptoms, thinking he may have had seizure.  He had a witnessed seizure in the ED and was treated with Ativan and loaded with Keppra.  He had to be started on Precedex.  He was tapered off Precedex and started on Librium orally.    Subjective   Today patient complains of joint pains especially in the left ankle and left elbow.  He has history of gout.   Assessment/Plan:    Alcohol withdrawal seizure -Continue Keppra; as needed Ativan -Continue thiamine, folate -Continue CIWA protocol  Acute gout flare -Involving multiple joints involving left ankle and left elbow -Uric acid obtained today 7.3 -We will start prednisone 40 mg daily  Hepatic steatosis -Secondary to alcohol abuse -LFTs improving  Hypertension -Continue Aldactone, as needed labetalol  Schizoaffective disorder -Continue Seroquel, Zoloft  Sacral decubitus ulcer, POA       Medications     folic acid  1 mg Oral Daily   gabapentin  300 mg Oral TID   lactulose  10 g Oral TID   levETIRAcetam  500 mg Oral BID   mouth rinse  15 mL Mouth Rinse BID   metoprolol tartrate  25 mg Oral BID   multivitamin with minerals  1 tablet Oral Daily   nicotine  21 mg Transdermal Daily   pantoprazole  40 mg Oral Daily   QUEtiapine  200 mg Oral QHS   sertraline  25 mg Oral Daily   spironolactone  100 mg Oral Daily   thiamine  100 mg Oral Daily     Data Reviewed:   CBG:  Recent Labs  Lab 09/15/21 0000  GLUCAP 114*    SpO2: 95 %    Vitals:   09/17/21 0638 09/17/21 1430 09/18/21 0511 09/18/21 1414  BP: 104/66 109/69 103/75 104/72  Pulse: 84 84 77 85  Resp: 20 16 17 16   Temp: 99 F (37.2 C) 98.2 F (36.8 C) 98.3 F (36.8  C) 98.8 F (37.1 C)  TempSrc: Oral Oral  Oral  SpO2: 95% 92% 96% 95%  Weight:      Height:          Data Reviewed:  Basic Metabolic Panel: Recent Labs  Lab 09/12/21 0245 09/13/21 0249 09/18/21 0944  NA 132*  --  133*  K 3.7  --  4.5  CL 104  --  98  CO2 19*  --  26  GLUCOSE 108*  --  109*  BUN 14  --  17  CREATININE 0.89  --  0.96  CALCIUM 8.7*  --  9.9  MG 1.6* 1.8  --   PHOS 4.2  --   --     CBC: Recent Labs  Lab 09/12/21 0245 09/13/21 0249 09/18/21 0944  WBC 7.4 8.0 10.4  NEUTROABS 3.8 3.9  --   HGB 12.2* 11.9* 12.0*  HCT 37.9* 36.8* 37.1*  MCV 103.8* 103.1* 102.2*  PLT 191 245 636*       Antibiotics: Anti-infectives (From admission, onward)    None        DVT prophylaxis: Lovenox  Code Status: Full code  Family Communication: No family at bedside      Objective    Physical Examination:   General-appears in no acute distress Heart-S1-S2,  regular, no murmur auscultated Lungs-clear to auscultation bilaterally, no wheezing or crackles auscultated Abdomen-soft, nontender, no organomegaly Extremities-no edema in the lower extremities Musculoskeletal-left ankle joint tenderness to palpation, tenderness in left elbow Neuro-alert, oriented x3, no focal deficit noted  Status is: Inpatient ; awaiting to go to skilled nursing facility    Pressure Injury 07/16/20 Buttocks Medial Stage 1 -  Intact skin with non-blanchable redness of a localized area usually over a bony prominence. (Active)  07/16/20 2000  Location: Buttocks  Location Orientation: Medial  Staging: Stage 1 -  Intact skin with non-blanchable redness of a localized area usually over a bony prominence.  Wound Description (Comments):   Present on Admission: Yes          [place]   [facility] If 7PM-7AM, please contact night-coverage at [email or web address], Office  [phone]   09/18/2021, 7:24 PM  LOS: 11 days

## 2021-09-18 NOTE — Progress Notes (Signed)
Physical Therapy Treatment Patient Details Name: [name] MRN: [id number] DOB: 1971/03/26 Today's Date: 09/18/2021   History of Present Illness [name] is an 51 y.o. male past medical history of alcohol abuse and alcoholic cirrhosis, schizoaffective disorder with seizures who comes in with withdrawal symptoms thinking he may to have a seizure.  Had a witnessed seizure in the ED and was treated with Ativan and loaded with Keppra.    PT Comments    Pt progressing with PT. Incr gait distance and activity tolerance. Continue PT POC   Recommendations for follow up therapy are one component of a multi-disciplinary discharge planning process, led by the attending physician.  Recommendations may be updated based on patient status, additional functional criteria and insurance authorization.  Follow Up Recommendations  Skilled nursing-short term rehab (<3 hours/day)     Assistance Recommended at Discharge Frequent or constant Supervision/Assistance  Patient can return home with the following A little help with walking and/or transfers;Assistance with cooking/housework;Direct supervision/assist for medications management;Assist for transportation;Help with stairs or ramp for entrance   Equipment Recommendations  None recommended by PT    Recommendations for Other Services       Precautions / Restrictions Precautions Precautions: Fall Precaution Comments: mild ataxia Restrictions Weight Bearing Restrictions: No     Mobility  Bed Mobility Overal bed mobility: Modified Independent Bed Mobility: Supine to Sit                Transfers Overall transfer level: Needs assistance Equipment used: Rolling walker (2 wheels) Transfers: Sit to/from Stand Sit to Stand: Min guard, Min assist           General transfer comment: cues for sequencing, hand placement, LE position. min/guard to stand from slightly elevated bed    Ambulation/Gait Ambulation/Gait assistance: Min  assist, Min guard Gait Distance (Feet): 30 Feet Assistive device: Rolling walker (2 wheels) Gait Pattern/deviations: Step-to pattern, Decreased weight shift to left, Wide base of support, Trunk flexed       General Gait Details: cues for sequence, trunk extension. min assist to min/guard to balance and stabilize with wt shifting, able to tol incr wt L foot and decrease BOS   Stairs             Wheelchair Mobility    Modified Rankin (Stroke Patients Only)       Balance     Sitting balance-Leahy Scale: Good     Standing balance support: Reliant on assistive device for balance Standing balance-Leahy Scale: Poor                              Cognition Arousal/Alertness: Awake/alert Behavior During Therapy: WFL for tasks assessed/performed Overall Cognitive Status: Within Functional Limits for tasks assessed                                 General Comments: L wrist with notable decr edema and improving flexion/extension        Exercises      General Comments        Pertinent Vitals/Pain Pain Assessment Pain Assessment: Faces Faces Pain Scale: Hurts little more Pain Location: bilateral ankles and wrists 2* gout, L>R Pain Descriptors / Indicators: Grimacing, Sore Pain Intervention(s): Limited activity within patient's tolerance, Monitored during session, Repositioned    Home Living  Prior Function            PT Goals (current goals can now be found in the care plan section) Acute Rehab PT Goals Patient Stated Goal: to get stronger PT Goal Formulation: With patient Time For Goal Achievement: 09/26/21 Potential to Achieve Goals: Good Progress towards PT goals: Progressing toward goals    Frequency    Min 2X/week      PT Plan Current plan remains appropriate    Co-evaluation              AM-PAC PT "6 Clicks" Mobility   Outcome Measure  Help needed turning from your back to  your side while in a flat bed without using bedrails?: None Help needed moving from lying on your back to sitting on the side of a flat bed without using bedrails?: None Help needed moving to and from a bed to a chair (including a wheelchair)?: A Little Help needed standing up from a chair using your arms (e.g., wheelchair or bedside chair)?: A Little Help needed to walk in hospital room?: A Little Help needed climbing 3-5 steps with a railing? : A Lot 6 Click Score: 19    End of Session Equipment Utilized During Treatment: Gait belt Activity Tolerance: Patient tolerated treatment well Patient left: in bed;with call bell/phone within reach;with bed alarm set Nurse Communication: Mobility status PT Visit Diagnosis: Muscle weakness (generalized) (M62.81);Difficulty in walking, not elsewhere classified (R26.2)     Time: 2094-7096 PT Time Calculation (min) (ACUTE ONLY): 16 min  Charges:  $Gait Training: 8-22 mins                     Baxter Flattery, PT  Acute Rehab Dept ([place]) [phone] Pager [phone]  09/18/2021    [facility] 09/18/2021, 11:32 AM

## 2021-09-18 NOTE — TOC Progression Note (Signed)
Transition of Care [facility]) - Progression Note   Patient Details  Name: Sinai Mahany MRN: [id number] Date of Birth: 09-04-1970  Transition of Care [facility]) CM/SW [place], LCSW Phone Number: 09/18/2021, 12:12 PM  Clinical Narrative: Level II PASRR is still pending; TOC awaiting patient's interview for PASRR. Patient has not received any bed offers and has been declined by 16 SNFs. CSW expanded bed search and faxed out additional referrals to 20+ SNFs. TOC to follow.  Expected Discharge Plan: Skilled Nursing Facility Barriers to Discharge: [place] [name]), Active Substance Use - Placement, Continued Medical Work up  Expected Discharge Plan and Services Expected Discharge Plan: [place] In-house Referral: Clinical Social Work  Readmission Risk Interventions Readmission Risk Prevention Plan 01/15/2019  Medication Screening Complete  Transportation Screening Complete  Some recent data might be hidden

## 2021-09-18 NOTE — Plan of Care (Signed)
Problem: Education: Goal: Expressions of having a comfortable level of knowledge regarding the disease process will increase 09/18/2021 2356 by Ryan Ser, RN Outcome: Progressing 09/18/2021 2356 by Ryan Ser, RN Outcome: Progressing 09/18/2021 2356 by Ryan Ser, RN Outcome: Progressing   Problem: Coping: Goal: Ability to adjust to condition or change in health will improve 09/18/2021 2356 by Ryan Ser, RN Outcome: Progressing 09/18/2021 2356 by Ryan Ser, RN Outcome: Progressing 09/18/2021 2356 by Ryan Ser, RN Outcome: Progressing Goal: Ability to identify appropriate support needs will improve 09/18/2021 2356 by Ryan Ser, RN Outcome: Progressing 09/18/2021 2356 by Ryan Ser, RN Outcome: Progressing 09/18/2021 2356 by Ryan Ser, RN Outcome: Progressing   Problem: Health Behavior/Discharge Planning: Goal: Compliance with prescribed medication regimen will improve 09/18/2021 2356 by Ryan Ser, RN Outcome: Progressing 09/18/2021 2356 by Ryan Ser, RN Outcome: Progressing 09/18/2021 2356 by Ryan Ser, RN Outcome: Progressing   Problem: Medication: Goal: Risk for medication side effects will decrease 09/18/2021 2356 by Ryan Ser, RN Outcome: Progressing 09/18/2021 2356 by Ryan Ser, RN Outcome: Progressing 09/18/2021 2356 by Ryan Ser, RN Outcome: Progressing   Problem: Clinical Measurements: Goal: Complications related to the disease process, condition or treatment will be avoided or minimized 09/18/2021 2356 by Ryan Ser, RN Outcome: Progressing 09/18/2021 2356 by Ryan Ser, RN Outcome: Progressing 09/18/2021 2356 by Ryan Ser, RN Outcome: Progressing Goal: Diagnostic test results will improve 09/18/2021 2356 by Ryan Ser, RN Outcome: Progressing 09/18/2021 2356 by Ryan Ser, RN Outcome: Progressing 09/18/2021 2356 by Ryan Ser, RN Outcome: Progressing    Problem: Safety: Goal: Verbalization of understanding the information provided will improve 09/18/2021 2356 by Ryan Ser, RN Outcome: Progressing 09/18/2021 2356 by Ryan Ser, RN Outcome: Progressing 09/18/2021 2356 by Ryan Ser, RN Outcome: Progressing   Problem: Self-Concept: Goal: Level of anxiety will decrease 09/18/2021 2356 by Ryan Ser, RN Outcome: Progressing 09/18/2021 2356 by Ryan Ser, RN Outcome: Progressing 09/18/2021 2356 by Ryan Ser, RN Outcome: Progressing Goal: Ability to verbalize feelings about condition will improve 09/18/2021 2356 by Ryan Ser, RN Outcome: Progressing 09/18/2021 2356 by Ryan Ser, RN Outcome: Progressing 09/18/2021 2356 by Ryan Ser, RN Outcome: Progressing   Problem: Education: Goal: Knowledge of General Education information will improve Description: Including pain rating scale, medication(s)/side effects and non-pharmacologic comfort measures 09/18/2021 2356 by Ryan Ser, RN Outcome: Progressing 09/18/2021 2356 by Ryan Ser, RN Outcome: Progressing 09/18/2021 2356 by Ryan Ser, RN Outcome: Progressing   Problem: Health Behavior/Discharge Planning: Goal: Ability to manage health-related needs will improve 09/18/2021 2356 by Ryan Ser, RN Outcome: Progressing 09/18/2021 2356 by Ryan Ser, RN Outcome: Progressing 09/18/2021 2356 by Ryan Ser, RN Outcome: Progressing   Problem: Clinical Measurements: Goal: Ability to maintain clinical measurements within normal limits will improve 09/18/2021 2356 by Ryan Ser, RN Outcome: Progressing 09/18/2021 2356 by Ryan Ser, RN Outcome: Progressing 09/18/2021 2356 by Ryan Ser, RN Outcome: Progressing Goal: Will remain free from infection 09/18/2021 2356 by Ryan Ser, RN Outcome: Progressing 09/18/2021 2356 by Ryan Ser, RN Outcome: Progressing 09/18/2021 2356 by Ryan Ser,  RN Outcome: Progressing Goal: Diagnostic test results will improve 09/18/2021 2356 by Ryan Ser, RN Outcome: Progressing 09/18/2021 2356 by Ryan Ser, RN Outcome: Progressing 09/18/2021 2356 by Ryan Ser, RN Outcome: Progressing Goal: Respiratory  complications will improve 09/18/2021 2356 by Ryan Ser, RN Outcome: Progressing 09/18/2021 2356 by Ryan Ser, RN Outcome: Progressing 09/18/2021 2356 by Ryan Ser, RN Outcome: Progressing Goal: Cardiovascular complication will be avoided 09/18/2021 2356 by Ryan Ser, RN Outcome: Progressing 09/18/2021 2356 by Ryan Ser, RN Outcome: Progressing 09/18/2021 2356 by Ryan Ser, RN Outcome: Progressing   Problem: Activity: Goal: Risk for activity intolerance will decrease 09/18/2021 2356 by Ryan Ser, RN Outcome: Progressing 09/18/2021 2356 by Ryan Ser, RN Outcome: Progressing 09/18/2021 2356 by Ryan Ser, RN Outcome: Progressing   Problem: Nutrition: Goal: Adequate nutrition will be maintained 09/18/2021 2356 by Ryan Ser, RN Outcome: Progressing 09/18/2021 2356 by Ryan Ser, RN Outcome: Progressing 09/18/2021 2356 by Ryan Ser, RN Outcome: Progressing   Problem: Coping: Goal: Level of anxiety will decrease 09/18/2021 2356 by Ryan Ser, RN Outcome: Progressing 09/18/2021 2356 by Ryan Ser, RN Outcome: Progressing 09/18/2021 2356 by Ryan Ser, RN Outcome: Progressing   Problem: Pain Managment: Goal: General experience of comfort will improve 09/18/2021 2356 by Ryan Ser, RN Outcome: Progressing 09/18/2021 2356 by Ryan Ser, RN Outcome: Progressing 09/18/2021 2356 by Ryan Ser, RN Outcome: Progressing   Problem: Safety: Goal: Ability to remain free from injury will improve 09/18/2021 2356 by Ryan Ser, RN Outcome: Progressing 09/18/2021 2356 by Ryan Ser, RN Outcome: Progressing 09/18/2021 2356 by  Ryan Ser, RN Outcome: Progressing   Problem: Skin Integrity: Goal: Risk for impaired skin integrity will decrease 09/18/2021 2356 by Ryan Ser, RN Outcome: Progressing 09/18/2021 2356 by Ryan Ser, RN Outcome: Progressing 09/18/2021 2356 by Ryan Ser, RN Outcome: Progressing

## 2021-09-19 DIAGNOSIS — F251 Schizoaffective disorder, depressive type: Secondary | ICD-10-CM

## 2021-09-19 NOTE — Progress Notes (Signed)
[facility]  PROGRESS NOTE  [name] DJM:[id number] DOB: [birth year] DOA: 09/07/2021 PCP: [name], MD   Brief HPI:   51 year old male with past medical history of alcohol abuse, alcoholic cirrhosis, schizoaffective disorder with seizures who came with acute withdrawal symptoms, thinking he may have had seizure.  He had a witnessed seizure in the ED and was treated with Ativan and loaded with Keppra.  He had to be started on Precedex.  He was tapered off Precedex and started on Librium orally.    Subjective   Patient was started on prednisone for gout.  Symptoms have significantly improved.   Assessment/Plan:    Alcohol withdrawal seizure -Continue Keppra; as needed Ativan -Continue thiamine, folate -Continue CIWA protocol  Acute gout flare -Involving multiple joints involving left ankle and left elbow -Uric acid obtained today 7.3 -Started on  prednisone 40 mg daily -Significantly improved  Hepatic steatosis -Secondary to alcohol abuse -LFTs improving  Hypertension -Continue Aldactone, as needed labetalol  Schizoaffective disorder -Continue Seroquel, Zoloft  Sacral decubitus ulcer, POA       Medications     enoxaparin (LOVENOX) injection  40 mg Subcutaneous Q22L   folic acid  1 mg Oral Daily   gabapentin  300 mg Oral TID   lactulose  10 g Oral TID   levETIRAcetam  500 mg Oral BID   mouth rinse  15 mL Mouth Rinse BID   metoprolol tartrate  25 mg Oral BID   multivitamin with minerals  1 tablet Oral Daily   nicotine  21 mg Transdermal Daily   pantoprazole  40 mg Oral Daily   prednisoLONE  40 mg Oral Daily   QUEtiapine  200 mg Oral QHS   sertraline  25 mg Oral Daily   spironolactone  100 mg Oral Daily   thiamine  100 mg Oral Daily     Data Reviewed:   CBG:  Recent Labs  Lab 09/15/21 0000  GLUCAP 114*    SpO2: 92 %    Vitals:   09/18/21 1414 09/18/21 2037 09/19/21 0547 09/19/21 1425  BP: 104/72 110/77 116/80 104/67   Pulse: 85 77 73 67  Resp: 16 18 18 18   Temp: 98.8 F (37.1 C) 98.1 F (36.7 C) 97.8 F (36.6 C) 97.8 F (36.6 C)  TempSrc: Oral Oral Oral Oral  SpO2: 95% 94% 97% 92%  Weight:      Height:          Data Reviewed:  Basic Metabolic Panel: Recent Labs  Lab 09/13/21 0249 09/18/21 0944  NA  --  133*  K  --  4.5  CL  --  98  CO2  --  26  GLUCOSE  --  109*  BUN  --  17  CREATININE  --  0.96  CALCIUM  --  9.9  MG 1.8  --     CBC: Recent Labs  Lab 09/13/21 0249 09/18/21 0944  WBC 8.0 10.4  NEUTROABS 3.9  --   HGB 11.9* 12.0*  HCT 36.8* 37.1*  MCV 103.1* 102.2*  PLT 245 636*       Antibiotics: Anti-infectives (From admission, onward)    None        DVT prophylaxis: Lovenox  Code Status: Full code  Family Communication: No family at bedside      Objective    Physical Examination:   General-appears in no acute distress Heart-S1-S2, regular, no murmur auscultated Lungs-clear to auscultation bilaterally, no wheezing or crackles auscultated Abdomen-soft, nontender, no  organomegaly Extremities-no edema in the lower extremities Musculoskeletal-mild warmth and tenderness in left ankle Neuro-alert, oriented x3, no focal deficit noted  Status is: Inpatient ; awaiting to go to skilled nursing facility    Pressure Injury 07/16/20 Buttocks Medial Stage 1 -  Intact skin with non-blanchable redness of a localized area usually over a bony prominence. (Active)  07/16/20 2000  Location: Buttocks  Location Orientation: Medial  Staging: Stage 1 -  Intact skin with non-blanchable redness of a localized area usually over a bony prominence.  Wound Description (Comments):   Present on Admission: Yes          [place]   [facility] If 7PM-7AM, please contact night-coverage at [email or web address], Office  [phone]   09/19/2021, 4:56 PM  LOS: 12 days

## 2021-09-19 NOTE — Progress Notes (Signed)
Occupational Therapy Treatment Patient Details Name: [name] MRN: [id number] DOB: 1971-02-01 Today's Date: 09/19/2021   History of present illness [name] is an 51 y.o. male past medical history of alcohol abuse and alcoholic cirrhosis, schizoaffective disorder with seizures who comes in with withdrawal symptoms thinking he may to have a seizure.  Had a witnessed seizure in the ED and was treated with Ativan and loaded with Keppra.   OT comments  Treatment focused on improving patient's activity tolerance - including mobility, sit to stands, and donning and doffing LB clothing. Patient ambulated better with shoes on and with walker but can walk without it with a wide base of support. Patient ambulated 50 feet and then 100 feet. Able to sit to stand without upper extremity support x 5. Patient continues to be slightly tremulous and unsteady but progressing well towards goals. Appears that short term rehab is not an option with no bed offers. With some more time and therapy expect patient can discharge home with [facility].    Recommendations for follow up therapy are one component of a multi-disciplinary discharge planning process, led by the attending physician.  Recommendations may be updated based on patient status, additional functional criteria and insurance authorization.    Follow Up Recommendations  Home health OT    Assistance Recommended at Discharge Intermittent Supervision/Assistance  Patient can return home with the following  Assistance with cooking/housework;Direct supervision/assist for medications management;A little help with bathing/dressing/bathroom;A little help with walking and/or transfers   Equipment Recommendations  None recommended by OT    Recommendations for Other Services      Precautions / Restrictions Precautions Precautions: Fall Precaution Comments: mildly tremulous Restrictions Weight Bearing Restrictions: No          Balance Overall balance  assessment: Mild deficits observed, not formally tested                                         ADL either performed or assessed with clinical judgement   ADL Overall ADL's : Needs assistance/impaired                     Lower Body Dressing: Supervision/safety;Sit to/from stand Lower Body Dressing Details (indicate cue type and reason): able to don and doff socks, and don shoes - managing laces as well. Patient seated on window seat.               General ADL Comments: To work on improving activity tolerance and functional mobility needed for return home (patient lives alone and walks to grocery store). Patient ambulated in hall x 50 feet wiht walker, perfomred 5 sit to stands, and then ambulated another 100 ft with a walker.      Cognition Arousal/Alertness: Awake/alert Behavior During Therapy: WFL for tasks assessed/performed Overall Cognitive Status: Within Functional Limits for tasks assessed                                                     Pertinent Vitals/ Pain       Pain Assessment Pain Assessment: No/denies pain   Frequency  Min 2X/week        Progress Toward Goals  OT Goals(current goals can now be found in the care  plan section)  Progress towards OT goals: Progressing toward goals  Acute Rehab OT Goals Patient Stated Goal: to get stronger and go home OT Goal Formulation: With patient Time For Goal Achievement: 09/26/21 Potential to Achieve Goals: Good  Plan Discharge plan needs to be updated    Co-evaluation          OT goals addressed during session: ADL's and self-care (functional mobility, activity tolerance)      AM-PAC OT "6 Clicks" Daily Activity     Outcome Measure   Help from another person eating meals?: A Little Help from another person taking care of personal grooming?: A Little Help from another person toileting, which includes using toliet, bedpan, or urinal?: A Little Help  from another person bathing (including washing, rinsing, drying)?: A Little Help from another person to put on and taking off regular upper body clothing?: A Little Help from another person to put on and taking off regular lower body clothing?: A Little 6 Click Score: 18    End of Session Equipment Utilized During Treatment: Rolling walker (2 wheels)  OT Visit Diagnosis: Unsteadiness on feet (R26.81);Muscle weakness (generalized) (M62.81)   Activity Tolerance Patient tolerated treatment well   Patient Left in chair;with call bell/phone within reach;with chair alarm set   Nurse Communication Mobility status        Time: 6808-8110 OT Time Calculation (min): 24 min  Charges: OT General Charges $OT Visit: 1 Visit OT Treatments $Therapeutic Activity: 23-37 mins  Dontarius Sheley, OTR/L [place]  Office [phone] Pager: [place] 09/19/2021, 4:01 PM

## 2021-09-19 NOTE — TOC Progression Note (Signed)
Transition of Care [facility]) - Progression Note   Patient Details  Name: [name] MRN: [id number] Date of Birth: 01/07/1971  Transition of Care [facility]) CM/SW [place], LCSW Phone Number: 09/19/2021, 12:22 PM  Clinical Narrative: Patient has not received any bed offers. CSW asked [place], [place], and [place] to review the referral. CSW only received a response from [facility], which cannot accept admissions at this time due to compliance issues.  CSW met with patient to discuss not receiving bed offers. Patient reported he would prefer to go home rather than go to rehab. Patient has a rolling walker at home. TOC to follow.  Expected Discharge Plan: Skilled Nursing Facility Barriers to Discharge: [name]), Active Substance Use - Placement, Continued Medical Work up  Expected Discharge Plan and Services Expected Discharge Plan: [place] In-house Referral: Clinical Social Work  Readmission Risk Interventions Readmission Risk Prevention Plan 09/19/2021 01/15/2019  Medication Screening - Complete  Transportation Screening - Complete  HRI or Home Care Consult Complete -  Social Work Consult for [place] Planning/Counseling Complete -  Palliative Care Screening Not Applicable -  Some recent data might be hidden

## 2021-09-19 NOTE — Plan of Care (Signed)
°  Problem: Clinical Measurements: Goal: Complications related to the disease process, condition or treatment will be avoided or minimized Outcome: Progressing   Problem: Safety: Goal: Verbalization of understanding the information provided will improve Outcome: Progressing   Problem: Activity: Goal: Risk for activity intolerance will decrease Outcome: Progressing   Problem: Pain Managment: Goal: General experience of comfort will improve Outcome: Progressing

## 2021-09-19 NOTE — Plan of Care (Signed)
°  Problem: Education: Goal: Expressions of having a comfortable level of knowledge regarding the disease process will increase Outcome: Progressing   Problem: Coping: Goal: Ability to adjust to condition or change in health will improve Outcome: Progressing Goal: Ability to identify appropriate support needs will improve Outcome: Progressing   Problem: Health Behavior/Discharge Planning: Goal: Compliance with prescribed medication regimen will improve Outcome: Progressing   Problem: Medication: Goal: Risk for medication side effects will decrease Outcome: Progressing   Problem: Clinical Measurements: Goal: Complications related to the disease process, condition or treatment will be avoided or minimized Outcome: Progressing Goal: Diagnostic test results will improve Outcome: Progressing   Problem: Safety: Goal: Verbalization of understanding the information provided will improve Outcome: Progressing   Problem: Self-Concept: Goal: Level of anxiety will decrease Outcome: Progressing Goal: Ability to verbalize feelings about condition will improve Outcome: Progressing   Problem: Education: Goal: Knowledge of General Education information will improve Description: Including pain rating scale, medication(s)/side effects and non-pharmacologic comfort measures Outcome: Progressing   Problem: Health Behavior/Discharge Planning: Goal: Ability to manage health-related needs will improve Outcome: Progressing   Problem: Clinical Measurements: Goal: Ability to maintain clinical measurements within normal limits will improve Outcome: Progressing Goal: Will remain free from infection Outcome: Progressing Goal: Diagnostic test results will improve Outcome: Progressing Goal: Respiratory complications will improve Outcome: Progressing Goal: Cardiovascular complication will be avoided Outcome: Progressing   Problem: Activity: Goal: Risk for activity intolerance will  decrease Outcome: Progressing   Problem: Nutrition: Goal: Adequate nutrition will be maintained Outcome: Progressing   Problem: Coping: Goal: Level of anxiety will decrease Outcome: Progressing   Problem: Pain Managment: Goal: General experience of comfort will improve Outcome: Progressing   Problem: Safety: Goal: Ability to remain free from injury will improve Outcome: Progressing   Problem: Skin Integrity: Goal: Risk for impaired skin integrity will decrease Outcome: Progressing

## 2021-09-20 LAB — CBC
HCT: 36.5 % — ABNORMAL LOW (ref 39.0–52.0)
Hemoglobin: 11.7 g/dL — ABNORMAL LOW (ref 13.0–17.0)
MCH: 32.7 pg (ref 26.0–34.0)
MCHC: 32.1 g/dL (ref 30.0–36.0)
MCV: 102 fL — ABNORMAL HIGH (ref 80.0–100.0)
Platelets: 762 10*3/uL — ABNORMAL HIGH (ref 150–400)
RBC: 3.58 MIL/uL — ABNORMAL LOW (ref 4.22–5.81)
RDW: 15.9 % — ABNORMAL HIGH (ref 11.5–15.5)
WBC: 15.5 10*3/uL — ABNORMAL HIGH (ref 4.0–10.5)
nRBC: 0 % (ref 0.0–0.2)

## 2021-09-20 LAB — BASIC METABOLIC PANEL
Anion gap: 9 (ref 5–15)
BUN: 30 mg/dL — ABNORMAL HIGH (ref 6–20)
CO2: 24 mmol/L (ref 22–32)
Calcium: 9.8 mg/dL (ref 8.9–10.3)
Chloride: 104 mmol/L (ref 98–111)
Creatinine, Ser: 1.05 mg/dL (ref 0.61–1.24)
GFR, Estimated: >60 mL/min (ref >60)
Glucose, Bld: 152 mg/dL — ABNORMAL HIGH (ref 70–99)
Potassium: 4.7 mmol/L (ref 3.5–5.1)
Sodium: 137 mmol/L (ref 135–145)

## 2021-09-20 MED ORDER — PREDNISOLONE 5 MG PO TABS
40.0000 mg | ORAL_TABLET | Freq: Every day | ORAL | Status: AC
  Administered 2021-09-21 – 2021-09-22 (×2): 40 mg via ORAL
  Filled 2021-09-20 (×2): qty 8

## 2021-09-20 NOTE — Progress Notes (Signed)
[facility]  PROGRESS NOTE  [name] GYI:[id number] DOB: [birth year] DOA: 09/07/2021 PCP: [name], MD   Brief HPI:   51 year old male with past medical history of alcohol abuse, alcoholic cirrhosis, schizoaffective disorder with seizures who came with acute withdrawal symptoms, thinking he may have had seizure.  He had a witnessed seizure in the ED and was treated with Ativan and loaded with Keppra.  He had to be started on Precedex.  He was tapered off Precedex and started on Librium orally.    Subjective   Patient seen and examined, joint pain has improved   Assessment/Plan:    Alcohol withdrawal seizure -Continue Keppra; as needed Ativan -Continue thiamine, folate -Continue CIWA protocol  Acute gout flare -Significantly improved with prednisone -Involving multiple joints involving left ankle and left elbow -Uric acid obtained today 7.3 -Started on  prednisone 40 mg daily for 5 days   Hepatic steatosis -Secondary to alcohol abuse -LFTs improving  Hypertension -Continue Aldactone, as needed labetalol  Schizoaffective disorder -Continue Seroquel, Zoloft  Sacral decubitus ulcer, POA  Thrombocytosis -Platelet count 636 , was 245 on 09/13/2021 -Unclear etiology -In the past also he had intermittently high level of platelets since 2016 -We will repeat platelets in a.m.       Medications     enoxaparin (LOVENOX) injection  40 mg Subcutaneous J50K   folic acid  1 mg Oral Daily   gabapentin  300 mg Oral TID   lactulose  10 g Oral TID   levETIRAcetam  500 mg Oral BID   mouth rinse  15 mL Mouth Rinse BID   metoprolol tartrate  25 mg Oral BID   multivitamin with minerals  1 tablet Oral Daily   nicotine  21 mg Transdermal Daily   pantoprazole  40 mg Oral Daily   prednisoLONE  40 mg Oral Daily   QUEtiapine  200 mg Oral QHS   sertraline  25 mg Oral Daily   spironolactone  100 mg Oral Daily   thiamine  100 mg Oral Daily     Data Reviewed:    CBG:  Recent Labs  Lab 09/15/21 0000  GLUCAP 114*    SpO2: 95 %    Vitals:   09/19/21 2130 09/19/21 2228 09/20/21 0400 09/20/21 0515  BP: 118/78 106/78 122/82 101/70  Pulse: 72 61 76 70  Resp:  18  20  Temp:  97.6 F (36.4 C)  97.9 F (36.6 C)  TempSrc:    Oral  SpO2:  93%  95%  Weight:      Height:          Data Reviewed:  Basic Metabolic Panel: Recent Labs  Lab 09/18/21 0944  NA 133*  K 4.5  CL 98  CO2 26  GLUCOSE 109*  BUN 17  CREATININE 0.96  CALCIUM 9.9    CBC: Recent Labs  Lab 09/18/21 0944  WBC 10.4  HGB 12.0*  HCT 37.1*  MCV 102.2*  PLT 636*       Antibiotics: Anti-infectives (From admission, onward)    None        DVT prophylaxis: Lovenox  Code Status: Full code  Family Communication: No family at bedside      Objective    Physical Examination:  General-appears in no acute distress Heart-S1-S2, regular, no murmur auscultated Lungs-clear to auscultation bilaterally, no wheezing or crackles auscultated Abdomen-soft, nontender, no organomegaly Extremities-no edema in the lower extremities Neuro-alert, oriented x3, no focal deficit noted  Status is: Inpatient ;  no skilled facility is taking this patient so he might go home Home with home health PT    Pressure Injury 07/16/20 Buttocks Medial Stage 1 -  Intact skin with non-blanchable redness of a localized area usually over a bony prominence. (Active)  07/16/20 2000  Location: Buttocks  Location Orientation: Medial  Staging: Stage 1 -  Intact skin with non-blanchable redness of a localized area usually over a bony prominence.  Wound Description (Comments):   Present on Admission: Yes          [place]   [facility] If 7PM-7AM, please contact night-coverage at [email or web address], Office  [phone]   09/20/2021, 1:31 PM  LOS: 13 days

## 2021-09-20 NOTE — TOC Progression Note (Signed)
Transition of Care [facility]) - Progression Note   Patient Details  Name: [name] MRN: [id number] Date of Birth: 19-Sep-1970  Transition of Care [facility]) CM/SW [place], LCSW Phone Number: 09/20/2021, 2:34 PM  Clinical Narrative: PT and OT recommendations are now [facility]. CSW made [facility] referrals to the following agencies:  [facility]: declined due to staffing [facility]: declined due to ETOH abuse [facility]: declined due to ETOH abuse [facility]: declined due to staffing [facility]: declined due to insurance [facility]: declined as insurance is out-of-network [facility]: declined without a pair  OPPT referral made to [facility]. CSW updated hospitalist and patient.  Expected Discharge Plan: OP Rehab Barriers to Discharge: Continued Medical Work up, No [facility] will accept this patient  Expected Discharge Plan and Services Expected Discharge Plan: OP Rehab In-house Referral: Clinical Social Work  Readmission Risk Interventions Readmission Risk Prevention Plan 09/19/2021 01/15/2019  Medication Screening - Complete  Transportation Screening - Complete  HRI or Home Care Consult Complete -  Social Work Consult for [place] Planning/Counseling Complete -  Palliative Care Screening Not Applicable -  Some recent data might be hidden

## 2021-09-20 NOTE — Progress Notes (Signed)
Physical Therapy Treatment Patient Details Name: [name] MRN: [id number] DOB: [birth year] Today's Date: 09/20/2021   History of Present Illness [name] is an 51 y.o. male past medical history of alcohol abuse and alcoholic cirrhosis, schizoaffective disorder with seizures who comes in with withdrawal symptoms thinking he may to have a seizure.  Had a witnessed seizure in the ED and was treated with Ativan and loaded with Keppra.    PT Comments    Pt progressing with activity tolerance and gait stability. Continues to require intermittent min assist with gait d/t decr balance/tremors/LE weakness and ankle pain. Will work towards home, [place] may not be available to pt per [facility]  Recommendations for follow up therapy are one component of a multi-disciplinary discharge planning process, led by the attending physician.  Recommendations may be updated based on patient status, additional functional criteria and insurance authorization.  Follow Up Recommendations  ?HHPT     Assistance Recommended at Discharge Frequent or constant Supervision/Assistance  Patient can return home with the following A little help with walking and/or transfers;Assistance with cooking/housework;Direct supervision/assist for medications management;Assist for transportation;Help with stairs or ramp for entrance   Equipment Recommendations  None recommended by PT    Recommendations for Other Services       Precautions / Restrictions Precautions Precautions: Fall Precaution Comments: mild ataxia/tremors with mobility Restrictions Weight Bearing Restrictions: No     Mobility  Bed Mobility Overal bed mobility: Modified Independent Bed Mobility: Supine to Sit                Transfers Overall transfer level: Needs assistance Equipment used: Rolling walker (2 wheels) Transfers: Sit to/from Stand Sit to Stand: Supervision           General transfer comment: close supervision for safety. pt  self cues for hand placement and powering up with LEs    Ambulation/Gait Ambulation/Gait assistance: Min assist, Min guard Gait Distance (Feet): 160 Feet Assistive device: Rolling walker (2 wheels) Gait Pattern/deviations: Wide base of support, Trunk flexed, Step-through pattern, Decreased stride length Gait velocity: decr     General Gait Details: cues for sequence, trunk extension. min assist to min/guard to balance and stabilize with wt shifting, able to tol incr wt L foot and decrease BOS, progressing step through, incr stride length with incr distance   Stairs             Wheelchair Mobility    Modified Rankin (Stroke Patients Only)       Balance     Sitting balance-Leahy Scale: Good     Standing balance support: Reliant on assistive device for balance Standing balance-Leahy Scale: Poor                              Cognition Arousal/Alertness: Awake/alert Behavior During Therapy: WFL for tasks assessed/performed Overall Cognitive Status: Within Functional Limits for tasks assessed                                 General Comments: L wrist with notable decr edema and improving flexion/extension        Exercises General Exercises - Lower Extremity Ankle Circles/Pumps: AROM, 5 reps, Both    General Comments        Pertinent Vitals/Pain Pain Assessment Pain Assessment: Faces Faces Pain Scale: Hurts a little bit Pain Location: bilateral ankles and wrists 2* gout, L>R Pain  Descriptors / Indicators: Grimacing, Sore Pain Intervention(s): Limited activity within patient's tolerance, Monitored during session, Repositioned    Home Living                          Prior Function            PT Goals (current goals can now be found in the care plan section) Acute Rehab PT Goals Patient Stated Goal: to get stronger PT Goal Formulation: With patient Time For Goal Achievement: 09/26/21 Potential to Achieve Goals:  Good Progress towards PT goals: Progressing toward goals    Frequency    Min 2X/week      PT Plan Current plan remains appropriate    Co-evaluation              AM-PAC PT "6 Clicks" Mobility   Outcome Measure  Help needed turning from your back to your side while in a flat bed without using bedrails?: None Help needed moving from lying on your back to sitting on the side of a flat bed without using bedrails?: None Help needed moving to and from a bed to a chair (including a wheelchair)?: A Little Help needed standing up from a chair using your arms (e.g., wheelchair or bedside chair)?: A Little Help needed to walk in hospital room?: A Little Help needed climbing 3-5 steps with a railing? : A Little 6 Click Score: 20    End of Session Equipment Utilized During Treatment: Gait belt Activity Tolerance: Patient tolerated treatment well Patient left: with call bell/phone within reach;in chair;with chair alarm set Nurse Communication: Mobility status PT Visit Diagnosis: Muscle weakness (generalized) (M62.81);Difficulty in walking, not elsewhere classified (R26.2)     Time: 2751-7001 PT Time Calculation (min) (ACUTE ONLY): 22 min  Charges:  $Gait Training: 8-22 mins                     Baxter Flattery, PT  Acute Rehab Dept ([place]) [phone] Pager [phone]  09/20/2021    [facility] 09/20/2021, 9:33 AM

## 2021-09-21 DIAGNOSIS — D696 Thrombocytopenia, unspecified: Secondary | ICD-10-CM

## 2021-09-21 MED ORDER — LORAZEPAM 2 MG/ML IJ SOLN: 1.0000 mg | INTRAMUSCULAR | Status: DC | PRN

## 2021-09-21 MED ORDER — LORAZEPAM 1 MG PO TABS
1.0000 mg | ORAL_TABLET | ORAL | Status: DC | PRN
  Administered 2021-09-21: 2 mg via ORAL
  Administered 2021-09-21: 1 mg via ORAL
  Administered 2021-09-21: 2 mg via ORAL
  Administered 2021-09-21: 1 mg via ORAL
  Administered 2021-09-22 (×3): 2 mg via ORAL
  Administered 2021-09-22 (×2): 1 mg via ORAL
  Administered 2021-09-22: 2 mg via ORAL
  Administered 2021-09-22: 1 mg via ORAL
  Administered 2021-09-22 – 2021-09-23 (×3): 2 mg via ORAL
  Filled 2021-09-21 (×2): qty 2
  Filled 2021-09-21: qty 1
  Filled 2021-09-21: qty 2
  Filled 2021-09-21: qty 1
  Filled 2021-09-21 (×3): qty 2
  Filled 2021-09-21: qty 1
  Filled 2021-09-21 (×2): qty 2
  Filled 2021-09-21: qty 1
  Filled 2021-09-21: qty 2
  Filled 2021-09-21: qty 1

## 2021-09-21 NOTE — Plan of Care (Signed)
°  Problem: Education: Goal: Expressions of having a comfortable level of knowledge regarding the disease process will increase Outcome: Progressing   Problem: Coping: Goal: Ability to adjust to condition or change in health will improve Outcome: Progressing   Problem: Clinical Measurements: Goal: Complications related to the disease process, condition or treatment will be avoided or minimized Outcome: Progressing   Problem: Safety: Goal: Verbalization of understanding the information provided will improve Outcome: Progressing

## 2021-09-21 NOTE — Progress Notes (Signed)
[facility]  PROGRESS NOTE  [name] WJX:[id number] DOB: 19-Jul-1971 DOA: 09/07/2021 PCP: [name], MD   Brief HPI:   51 year old male with past medical history of alcohol abuse, alcoholic cirrhosis, schizoaffective disorder with seizures who came with acute withdrawal symptoms, thinking he may have had seizure.  He had a witnessed seizure in the ED and was treated with Ativan and loaded with Keppra.  He had to be started on Precedex.  He was tapered off Precedex and started on Librium orally.    Subjective   Patient seen and examined, denies any complaints.   Assessment/Plan:    Alcohol withdrawal seizure -Continue Keppra; as needed Ativan -Continue thiamine, folate -Continue CIWA protocol  Acute gout flare -Significantly improved with prednisone -Involving multiple joints involving left ankle and left elbow -Uric acid obtained today 7.3 -Started on  prednisone 40 mg daily for 5 days   Hepatic steatosis -Secondary to alcohol abuse -LFTs improving  Hypertension -Continue Aldactone, as needed labetalol  Schizoaffective disorder -Continue Seroquel, Zoloft  Sacral decubitus ulcer, POA  Thrombocytosis -Platelet count 636 , was 245 on 09/13/2021 -Unclear etiology -In the past also he had intermittently high level of platelets since 2016 -Reviewed platelets elevated at 762 -Called and discussed with hematology, Dr. Willy Eddy such feels that this is reactive.  She will see patient in consult in a.m.       Medications     enoxaparin (LOVENOX) injection  40 mg Subcutaneous O13Y   folic acid  1 mg Oral Daily   gabapentin  300 mg Oral TID   lactulose  10 g Oral TID   levETIRAcetam  500 mg Oral BID   mouth rinse  15 mL Mouth Rinse BID   metoprolol tartrate  25 mg Oral BID   multivitamin with minerals  1 tablet Oral Daily   nicotine  21 mg Transdermal Daily   pantoprazole  40 mg Oral Daily   prednisoLONE  40 mg Oral Daily   QUEtiapine  200 mg Oral QHS    sertraline  25 mg Oral Daily   spironolactone  100 mg Oral Daily   thiamine  100 mg Oral Daily     Data Reviewed:   CBG:  Recent Labs  Lab 09/15/21 0000  GLUCAP 114*    SpO2: 97 %    Vitals:   09/20/21 1428 09/20/21 2144 09/20/21 2147 09/21/21 0546  BP: 113/80  99/63 107/79  Pulse: 69 (!) 59 60 (!) 56  Resp: 14 17 17 19   Temp: 97.7 F (36.5 C) 97.9 F (36.6 C) 97.9 F (36.6 C) 98 F (36.7 C)  TempSrc: Oral Oral Oral Oral  SpO2: 95%  95% 97%  Weight:      Height:          Data Reviewed:  Basic Metabolic Panel: Recent Labs  Lab 09/18/21 0944 09/20/21 1419  NA 133* 137  K 4.5 4.7  CL 98 104  CO2 26 24  GLUCOSE 109* 152*  BUN 17 30*  CREATININE 0.96 1.05  CALCIUM 9.9 9.8    CBC: Recent Labs  Lab 09/18/21 0944 09/20/21 1419  WBC 10.4 15.5*  HGB 12.0* 11.7*  HCT 37.1* 36.5*  MCV 102.2* 102.0*  PLT 636* 762*       Antibiotics: Anti-infectives (From admission, onward)    None        DVT prophylaxis: Lovenox  Code Status: Full code  Family Communication: No family at bedside      Objective  Physical Examination:  General-appears in no acute distress Heart-S1-S2, regular, no murmur auscultated Lungs-clear to auscultation bilaterally, no wheezing or crackles auscultated Abdomen-soft, nontender, no organomegaly Extremities-no edema in the lower extremities Neuro-alert, oriented x3, no focal deficit noted  Status is: Inpatient ; no skilled facility is taking this patient so he might go home Home with home health PT    Pressure Injury 07/16/20 Buttocks Medial Stage 1 -  Intact skin with non-blanchable redness of a localized area usually over a bony prominence. (Active)  07/16/20 2000  Location: Buttocks  Location Orientation: Medial  Staging: Stage 1 -  Intact skin with non-blanchable redness of a localized area usually over a bony prominence.  Wound Description (Comments):   Present on Admission: Yes           [place]   [facility] If 7PM-7AM, please contact night-coverage at [email or web address], Office  [phone]   09/21/2021, 1:44 PM  LOS: 14 days

## 2021-09-21 NOTE — Progress Notes (Signed)
Physical Therapy Treatment Patient Details Name: [name] MRN: [id number] DOB: 03-13-1971 Today's Date: 09/21/2021   History of Present Illness [name] is an 51 y.o. male past medical history of alcohol abuse and alcoholic cirrhosis, schizoaffective disorder with seizures who comes in with withdrawal symptoms thinking he may to have a seizure.  Had a witnessed seizure in the ED and was treated with Ativan and loaded with Keppra.    PT Comments    Pt continues to progress, improving activity tolerance,  tol incr amb distance and able to ascend/descend stairs with min/guard assist. Remains unsteady with longer distances and requires cues for consistent, safe use of RW. Pt is hopeful to d/c home soon.  He reports his sister will be able to drive him to grocery store, other errands.    Recommendations for follow up therapy are one component of a multi-disciplinary discharge planning process, led by the attending physician.  Recommendations may be updated based on patient status, additional functional criteria and insurance authorization.  Follow Up Recommendations  Other (comment) (TOC unable to find HHPT provider)     Assistance Recommended at Discharge Intermittent Supervision/Assistance  Patient can return home with the following A little help with walking and/or transfers;Help with stairs or ramp for entrance;Assistance with cooking/housework;Assist for transportation   Equipment Recommendations  None recommended by PT    Recommendations for Other Services       Precautions / Restrictions Precautions Precautions: Fall Precaution Comments: mild ataxia/LE tremors with mobility Restrictions Weight Bearing Restrictions: No     Mobility  Bed Mobility Overal bed mobility: Modified Independent Bed Mobility: Supine to Sit                Transfers   Equipment used: Rolling walker (2 wheels) Transfers: Sit to/from Stand Sit to Stand: Supervision            General transfer comment: close supervision for safety on transition to RW . pt self cues for hand placement and powering up with LEs    Ambulation/Gait Ambulation/Gait assistance: Min assist, Min guard Gait Distance (Feet): 170 Feet Assistive device: Rolling walker (2 wheels) Gait Pattern/deviations: Wide base of support, Trunk flexed, Step-through pattern, Decreased stride length       General Gait Details: multi-modal cues for sequence, to keep feet inside RW with turns, trunk extension. min assist to min/guard to balance and stabilize with turns,  decrease BOS and  incr stride length with incr distance. continues to have mild ataxia/tremors LLE/diminished quad control  however without overt knee buckling or LOB.   Stairs Stairs: Yes Stairs assistance: Min guard Stair Management: Two rails, Step to pattern, Forwards Number of Stairs: 3 General stair comments: cues for sequence (power up with RLE, descend with LLE),step to pattern for safety. min guard for balance, assist to place RW at top/bottom of steps   Wheelchair Mobility    Modified Rankin (Stroke Patients Only)       Balance   Sitting-balance support: Feet supported Sitting balance-Leahy Scale: Good     Standing balance support: Reliant on assistive device for balance Standing balance-Leahy Scale: Poor                              Cognition Arousal/Alertness: Awake/alert Behavior During Therapy: WFL for tasks assessed/performed Overall Cognitive Status: Within Functional Limits for tasks assessed  Exercises General Exercises - Lower Extremity Ankle Circles/Pumps: AROM, 5 reps, Both    General Comments        Pertinent Vitals/Pain Pain Assessment Pain Assessment: Faces Faces Pain Scale: Hurts a little bit Pain Location: L foot Pain Descriptors / Indicators: Discomfort Pain Intervention(s): Limited activity within patient's  tolerance, Monitored during session    Home Living                          Prior Function            PT Goals (current goals can now be found in the care plan section) Acute Rehab PT Goals Patient Stated Goal: to get stronger PT Goal Formulation: With patient Time For Goal Achievement: 09/26/21 Potential to Achieve Goals: Good Progress towards PT goals: Progressing toward goals    Frequency    Min 2X/week      PT Plan Current plan remains appropriate    Co-evaluation              AM-PAC PT "6 Clicks" Mobility   Outcome Measure  Help needed turning from your back to your side while in a flat bed without using bedrails?: None Help needed moving from lying on your back to sitting on the side of a flat bed without using bedrails?: None Help needed moving to and from a bed to a chair (including a wheelchair)?: A Little Help needed standing up from a chair using your arms (e.g., wheelchair or bedside chair)?: A Little Help needed to walk in hospital room?: A Little   6 Click Score: 17    End of Session Equipment Utilized During Treatment: Gait belt Activity Tolerance: Patient tolerated treatment well Patient left: with call bell/phone within reach;in chair;with chair alarm set Nurse Communication: Mobility status PT Visit Diagnosis: Muscle weakness (generalized) (M62.81);Difficulty in walking, not elsewhere classified (R26.2)     Time: 6010-9323 PT Time Calculation (min) (ACUTE ONLY): 16 min  Charges:  $Gait Training: 8-22 mins                     Baxter Flattery, PT  Acute Rehab Dept ([facility]) [phone] Pager [phone]  09/21/2021    [facility] 09/21/2021, 2:41 PM

## 2021-09-21 NOTE — Plan of Care (Signed)
°  Problem: Education: Goal: Expressions of having a comfortable level of knowledge regarding the disease process will increase Outcome: Progressing   Problem: Clinical Measurements: Goal: Complications related to the disease process, condition or treatment will be avoided or minimized Outcome: Progressing   Problem: Education: Goal: Knowledge of General Education information will improve Description: Including pain rating scale, medication(s)/side effects and non-pharmacologic comfort measures Outcome: Progressing   Problem: Coping: Goal: Level of anxiety will decrease Outcome: Progressing   Problem: Pain Managment: Goal: General experience of comfort will improve Outcome: Progressing

## 2021-09-22 DIAGNOSIS — F10939 Alcohol use, unspecified with withdrawal, unspecified: Secondary | ICD-10-CM

## 2021-09-22 DIAGNOSIS — D75839 Thrombocytosis, unspecified: Secondary | ICD-10-CM

## 2021-09-22 DIAGNOSIS — R569 Unspecified convulsions: Secondary | ICD-10-CM

## 2021-09-22 LAB — CBC
HCT: 39.4 % (ref 39.0–52.0)
Hemoglobin: 12.5 g/dL — ABNORMAL LOW (ref 13.0–17.0)
MCH: 33.1 pg (ref 26.0–34.0)
MCHC: 31.7 g/dL (ref 30.0–36.0)
MCV: 104.2 fL — ABNORMAL HIGH (ref 80.0–100.0)
Platelets: 894 10*3/uL — ABNORMAL HIGH (ref 150–400)
RBC: 3.78 MIL/uL — ABNORMAL LOW (ref 4.22–5.81)
RDW: 15.8 % — ABNORMAL HIGH (ref 11.5–15.5)
WBC: 15.8 10*3/uL — ABNORMAL HIGH (ref 4.0–10.5)
nRBC: 0 % (ref 0.0–0.2)

## 2021-09-22 NOTE — Progress Notes (Signed)
Pt ambulated in hall with assist from nursing staff twice late in shift. Pt tolerated this well and denied dizziness or extreme weakness.

## 2021-09-22 NOTE — Progress Notes (Signed)
[facility]  PROGRESS NOTE  [name] YCX:[id number] DOB: 01-18-1971 DOA: 09/07/2021 PCP: [name], MD   Brief HPI:   51 year old male with past medical history of alcohol abuse, alcoholic cirrhosis, schizoaffective disorder with seizures who came with acute withdrawal symptoms, thinking he may have had seizure.  He had a witnessed seizure in the ED and was treated with Ativan and loaded with Keppra.  He had to be started on Precedex.  He was tapered off Precedex and started on Librium orally.    Subjective   Patient seen and examined, no new complaints.   Assessment/Plan:    Alcohol withdrawal seizure -Continue Keppra; as needed Ativan -Continue thiamine, folate -Continue CIWA protocol  Acute gout flare -Significantly improved with prednisone -Involving multiple joints involving left ankle and left elbow -Uric acid obtained was elevated  7.3 -Started on  prednisone 40 mg daily for 5 days   Hepatic steatosis -Secondary to alcohol abuse -LFTs improving  Hypertension -Continue Aldactone, as needed labetalol  Schizoaffective disorder -Continue Seroquel, Zoloft  Sacral decubitus ulcer, POA  Thrombocytosis -Platelet count 636 , was 245 on 09/13/2021 -Unclear etiology -In the past also he had intermittently high level of platelets since 2016 -Reviewed platelets elevated at 894 -Hematology consulted, hematology feels is likely reactive.  No treatment recommended.       Medications     enoxaparin (LOVENOX) injection  40 mg Subcutaneous S97W   folic acid  1 mg Oral Daily   gabapentin  300 mg Oral TID   lactulose  10 g Oral TID   levETIRAcetam  500 mg Oral BID   mouth rinse  15 mL Mouth Rinse BID   metoprolol tartrate  25 mg Oral BID   multivitamin with minerals  1 tablet Oral Daily   nicotine  21 mg Transdermal Daily   pantoprazole  40 mg Oral Daily   QUEtiapine  200 mg Oral QHS   sertraline  25 mg Oral Daily   spironolactone  100 mg Oral  Daily   thiamine  100 mg Oral Daily     Data Reviewed:   CBG:  No results for input(s): GLUCAP in the last 168 hours.   SpO2: 97 %    Vitals:   09/21/21 1656 09/21/21 1808 09/21/21 2038 09/22/21 0501  BP: 108/60 106/60 107/75 105/84  Pulse: 75 74 (!) 59 64  Resp:   17 17  Temp:   98.2 F (36.8 C) (!) 97.3 F (36.3 C)  TempSrc:    Oral  SpO2:   94% 97%  Weight:      Height:          Data Reviewed:  Basic Metabolic Panel: Recent Labs  Lab 09/18/21 0944 09/20/21 1419  NA 133* 137  K 4.5 4.7  CL 98 104  CO2 26 24  GLUCOSE 109* 152*  BUN 17 30*  CREATININE 0.96 1.05  CALCIUM 9.9 9.8    CBC: Recent Labs  Lab 09/18/21 0944 09/20/21 1419 09/22/21 1100  WBC 10.4 15.5* 15.8*  HGB 12.0* 11.7* 12.5*  HCT 37.1* 36.5* 39.4  MCV 102.2* 102.0* 104.2*  PLT 636* 762* 894*       Antibiotics: Anti-infectives (From admission, onward)    None        DVT prophylaxis: Lovenox  Code Status: Full code  Family Communication: No family at bedside      Objective    Physical Examination:  General-appears in no acute distress Heart-S1-S2, regular, no murmur auscultated Lungs-clear to  auscultation bilaterally, no wheezing or crackles auscultated Abdomen-soft, nontender, no organomegaly Extremities-no edema in the lower extremities Neuro-alert, oriented x3, no focal deficit noted    Status is: Inpatient ; no skilled facility is taking this patient so he might go home Home with home health PT    Pressure Injury 07/16/20 Buttocks Medial Stage 1 -  Intact skin with non-blanchable redness of a localized area usually over a bony prominence. (Active)  07/16/20 2000  Location: Buttocks  Location Orientation: Medial  Staging: Stage 1 -  Intact skin with non-blanchable redness of a localized area usually over a bony prominence.  Wound Description (Comments):   Present on Admission: Yes          [place]   [facility] If 7PM-7AM,  please contact night-coverage at [email or web address], Office  [phone]   09/22/2021, 11:45 AM  LOS: 15 days

## 2021-09-22 NOTE — Consult Note (Signed)
[place] CONSULT NOTE  Patient Care Team: [name], MD as PCP - General (Family Medicine)  ASSESSMENT & PLAN Leukocytosis and thrombocytosis Likely reactive in nature, related to acute gout and possible infection from decubitus ulcer He does not need extensive evaluation and follow-up in this regard I anticipate it will resolve within a month I will schedule outpatient follow-up in 1 month for further evaluation  Alcoholic liver cirrhosis with recent withdrawal and seizure He acknowledged he needs to stop drinking He is currently on medical management His liver enzymes are stable  Anemia chronic illness No signs of bleeding Observe closely Recent iron studies and B12 were adequate  Discharge planning We will defer to primary service I will schedule outpatient follow-up I will sign off  All questions were answered. The patient knows to call the clinic with any problems, questions or concerns. I spent 55 minutes counseling the patient face to face and review of test results     Heath Lark, MD 09/22/2021 10:28 AM  CHIEF COMPLAINTS/PURPOSE OF CONSULTATION:  Elevated WBC and Thrombocytosis  HISTORY OF PRESENTING ILLNESS:  Charles Andringa 51 y.o. male is here because of abnormal CBC.  He was found to have abnormal CBC from this hospitalization The patient has been in the hospital for many days due to withdrawal seizure He has significant history of alcoholic liver cirrhosis and had drink alcohol several days prior to admission On review of historical CBC, the patient had mild thrombocytopenia throughout his hospitalization until September 18, 2021 when he was found to have elevated platelet count of 636,000 He also was noted to have acute gout and was started on prednisone recently Per documentation, he had mild sacral decubitus ulcer but the patient did not report any signs or symptoms of pain  There is not reported symptoms of sinus congestion, cough,  urinary frequency/urgency or dysuria, diarrhea, or abnormal skin rash.  He was started on prednisone for gout and felt slight relief  MEDICAL HISTORY:  Past Medical History:  Diagnosis Date   Alcohol abuse    Anxiety    at age 51   Blood transfusion without reported diagnosis    Cirrhosis ([place])    Depression    at age 51   GERD (gastroesophageal reflux disease)    Hypertension    Psychosis ([place])    PTSD (post-traumatic stress disorder)    Schizoaffective disorder    Seizure disorder ([place])    related to etoh seizure   Symptomatic anemia     SURGICAL HISTORY: Past Surgical History:  Procedure Laterality Date   ESOPHAGOGASTRODUODENOSCOPY (EGD) WITH PROPOFOL N/A 05/05/2020   Procedure: ESOPHAGOGASTRODUODENOSCOPY (EGD) WITH PROPOFOL;  Surgeon: [name], MD;  Location: [facility] END[facility]COPY;  Service: Endoscopy;  Laterality: N/A;   FOOT SURGERY     right   HAND SURGERY      SOCIAL HISTORY: Social History   Socioeconomic History   Marital status: Divorced    Spouse name: Not on file   Number of children: Not on file   Years of education: Not on file   Highest education level: Not on file  Occupational History   Not on file  Tobacco Use   Smoking status: Every Day    Packs/day: 1.50    Years: 30.00    Pack years: 45.00    Types: Cigarettes   Smokeless tobacco: Never  Vaping Use   Vaping Use: Never used  Substance and Sexual Activity   Alcohol use: Yes  Comment: "until I pass out"   Drug use: Yes    Frequency: 1.0 times per week    Types: Marijuana   Sexual activity: Yes    Birth control/protection: Condom  Other Topics Concern   Not on file  Social History Narrative   Not on file   Social Determinants of Health   Financial Resource Strain: Not on file  Food Insecurity: Not on file  Transportation Needs: Not on file  Physical Activity: Not on file  Stress: Not on file  Social Connections: Not on file  Intimate Partner Violence: Not on file     FAMILY HISTORY: Family History  Problem Relation Age of Onset   Alcoholism Mother    CAD Father    Depression Brother    Colon polyps Neg Hx    Esophageal cancer Neg Hx    Pancreatic cancer Neg Hx    Stomach cancer Neg Hx     ALLERGIES:  is allergic to bupropion.  MEDICATIONS:  Current Facility-Administered Medications  Medication Dose Route Frequency Provider Last Rate Last Admin   enoxaparin (LOVENOX) injection 40 mg  40 mg Subcutaneous Q24H [name], MD   40 mg at 86/76/19 5093   folic acid (FOLVITE) tablet 1 mg  1 mg Oral Daily Charlynne Cousins, MD   1 mg at 09/22/21 2671   gabapentin (NEURONTIN) capsule 300 mg  300 mg Oral TID Charlynne Cousins, MD   300 mg at 09/22/21 0835   labetalol (NORMODYNE) injection 10 mg  10 mg Intravenous Q4H PRN Charlynne Cousins, MD       lactulose [facility]) 10 GM/15ML solution 10 g  10 g Oral TID Charlynne Cousins, MD   10 g at 09/22/21 0837   levETIRAcetam (KEPPRA) tablet 500 mg  500 mg Oral BID Charlynne Cousins, MD   500 mg at 09/22/21 0835   LORazepam (ATIVAN) tablet 1-4 mg  1-4 mg Oral Q1H PRN [name], MD   1 mg at 09/22/21 2458   Or   LORazepam (ATIVAN) injection 1-4 mg  1-4 mg Intravenous Q1H PRN [name], MD       MEDLINE mouth rinse  15 mL Mouth Rinse BID Charlynne Cousins, MD   15 mL at 09/22/21 0838   metoprolol tartrate (LOPRESSOR) tablet 25 mg  25 mg Oral BID Charlynne Cousins, MD   25 mg at 09/22/21 0998   multivitamin with minerals tablet 1 tablet  1 tablet Oral Daily Charlynne Cousins, MD   1 tablet at 09/22/21 0836   nicotine (NICODERM CQ - dosed in mg/24 hours) patch 21 mg  21 mg Transdermal Daily Charlynne Cousins, MD   21 mg at 09/22/21 3382   oxyCODONE (Oxy IR/ROXICODONE) immediate release tablet 5 mg  5 mg Oral Q4H PRN Charlynne Cousins, MD   5 mg at 09/22/21 0836   pantoprazole (PROTONIX) EC tablet 40 mg  40 mg Oral Daily Charlynne Cousins, MD   40 mg at 09/22/21 0836    polyvinyl alcohol (LIQUIFILM TEARS) 1.4 % ophthalmic solution 1 drop  1 drop Both Eyes PRN Charlynne Cousins, MD   1 drop at 09/15/21 2124   prochlorperazine (COMPAZINE) injection 10 mg  10 mg Intravenous Q6H PRN Charlynne Cousins, MD       QUEtiapine (SEROQUEL) tablet 200 mg  200 mg Oral QHS Charlynne Cousins, MD   200 mg at 09/21/21 2120   sertraline (ZOLOFT) tablet  25 mg  25 mg Oral Daily Charlynne Cousins, MD   25 mg at 09/22/21 2774   spironolactone (ALDACTONE) tablet 100 mg  100 mg Oral Daily Charlynne Cousins, MD   100 mg at 09/22/21 1287   thiamine tablet 100 mg  100 mg Oral Daily Charlynne Cousins, MD   100 mg at 09/22/21 8676    REVIEW OF SYSTEMS:   Constitutional: Denies fevers, chills or abnormal night sweats Eyes: Denies blurriness of vision, double vision or watery eyes Ears, nose, mouth, throat, and face: Denies mucositis or sore throat Respiratory: Denies cough, dyspnea or wheezes Cardiovascular: Denies palpitation, chest discomfort or lower extremity swelling Gastrointestinal:  Denies nausea, heartburn or change in bowel habits Skin: Denies abnormal skin rashes Lymphatics: Denies new lymphadenopathy or easy bruising Neurological:Denies numbness, tingling or new weaknesses Behavioral/Psych: Mood is stable, no new changes  All other systems were reviewed with the patient and are negative.  PHYSICAL EXAMINATION: ECOG PERFORMANCE STATUS: 1 - Symptomatic but completely ambulatory  Vitals:   09/21/21 2038 09/22/21 0501  BP: 107/75 105/84  Pulse: (!) 59 64  Resp: 17 17  Temp: 98.2 F (36.8 C) (!) 97.3 F (36.3 C)  SpO2: 94% 97%   Filed Weights   09/07/21 0900 09/07/21 1500  Weight: 195 lb 1.7 oz (88.5 kg) 208 lb 12.4 oz (94.7 kg)    GENERAL:alert, no distress and comfortable SKIN: skin color, texture, turgor are normal, no rashes or significant lesions EYES: normal, conjunctiva are pink and non-injected, sclera clear OROPHARYNX:no exudate, no  erythema and lips, buccal mucosa, and tongue normal  NECK: supple, thyroid normal size, non-tender, without nodularity LYMPH:  no palpable lymphadenopathy in the cervical, axillary or inguinal LUNGS: clear to auscultation and percussion with normal breathing effort HEART: regular rate & rhythm and no murmurs and no lower extremity edema ABDOMEN:abdomen soft, non-tender and normal bowel sounds Musculoskeletal:no cyanosis of digits and no clubbing  PSYCH: alert & oriented x 3 with fluent speech NEURO: no focal motor/sensory deficits  LABORATORY DATA:  I have reviewed the data as listed Lab Results  Component Value Date   WBC 15.5 (H) 09/20/2021   HGB 11.7 (L) 09/20/2021   HCT 36.5 (L) 09/20/2021   MCV 102.0 (H) 09/20/2021   PLT 762 (H) 09/20/2021     RADIOGRAPHIC STUDIES: I have personally reviewed the radiological images as listed and agreed with the findings in the report. CT Head Wo Contrast  Result Date: 09/07/2021 CLINICAL DATA:  Seizure. EXAM: CT HEAD WITHOUT CONTRAST TECHNIQUE: Contiguous axial images were obtained from the base of the skull through the vertex without intravenous contrast. RADIATION DOSE REDUCTION: This exam was performed according to the departmental dose-optimization program which includes automated exposure control, adjustment of the mA and/or kV according to patient size and/or use of iterative reconstruction technique. COMPARISON:  08/26/2021 FINDINGS: Brain: No evidence of acute infarction, hemorrhage, hydrocephalus, extra-axial collection or mass lesion/mass effect. Vascular: No hyperdense vessel or unexpected calcification. Skull: Normal. Negative for fracture or focal lesion. Sinuses/Orbits: No acute finding. Retention cyst or polyp noted in the right maxillary sinus. Other: None. IMPRESSION: 1. No acute intracranial abnormalities. 2. Right maxillary sinus retention cyst or polyp. Electronically Signed   By: Kerby Moors M.D.   On: 09/07/2021 09:20   CT  HEAD WO CONTRAST (5MM)  Result Date: 08/26/2021 CLINICAL DATA:  51 year old male with headache and possible seizure. Cirrhosis. EXAM: CT HEAD WITHOUT CONTRAST TECHNIQUE: Contiguous axial images were obtained from the base  of the skull through the vertex without intravenous contrast. COMPARISON:  Brain MRI 11/03/2014.  Head CT 07/28/2021. FINDINGS: Brain: Stable cerebral volume. No midline shift, ventriculomegaly, mass effect, evidence of mass lesion, intracranial hemorrhage or evidence of cortically based acute infarction. Gray-white matter differentiation is within normal limits throughout the brain. No encephalomalacia identified. Vascular: Mild Calcified atherosclerosis at the skull base. No suspicious intracranial vascular hyperdensity. Skull: Negative. Sinuses/Orbits: Visualized paranasal sinuses and mastoids are clear. Other: Rightward gaze.  Scalp soft tissues within normal limits. IMPRESSION: Stable and negative noncontrast CT appearance of the brain. Electronically Signed   By: Genevie Ann M.D.   On: 08/26/2021 07:25   CT Cervical Spine Wo Contrast  Result Date: 08/26/2021 CLINICAL DATA:  51 year old male with headache and possible seizure. Cirrhosis. EXAM: CT CERVICAL SPINE WITHOUT CONTRAST TECHNIQUE: Multidetector CT imaging of the cervical spine was performed without intravenous contrast. Multiplanar CT image reconstructions were also generated. COMPARISON:  Head CT today.  Cervical spine CT 05/03/2020. FINDINGS: Alignment: Stable straightening and mild reversal of cervical lordosis. Cervicothoracic junction alignment is within normal limits. Bilateral posterior element alignment is within normal limits. Skull base and vertebrae: Visualized skull base is intact. No atlanto-occipital dissociation. C1 and C2 appear intact. No acute osseous abnormality identified. Soft tissues and spinal canal: No prevertebral fluid or swelling. No visible canal hematoma. Faint calcified carotid atherosclerosis, otherwise  negative visible noncontrast neck soft tissues. Disc levels: Chronic C4-C5 interbody ankylosis from bulky anterior endplate osteophyte. Bulky anterior osteophytes also at C5-C6. Only mild disc space loss. No cervical spinal stenosis suspected. Upper chest: Negative. IMPRESSION: 1. No acute traumatic injury identified in the cervical spine. 2. Chronic C4-C5 interbody ankylosis from bulky endplate osteophytes. Electronically Signed   By: Genevie Ann M.D.   On: 08/26/2021 07:29   CT ABDOMEN PELVIS W CONTRAST  Result Date: 08/26/2021 CLINICAL DATA:  51 year old male with history of epigastric pain. EXAM: CT ABDOMEN AND PELVIS WITH CONTRAST TECHNIQUE: Multidetector CT imaging of the abdomen and pelvis was performed using the standard protocol following bolus administration of intravenous contrast. CONTRAST:  37mL OMNIPAQUE IOHEXOL 350 MG/ML SOLN COMPARISON:  CT the abdomen and pelvis 09/30/2015. FINDINGS: Lower chest: Unremarkable. Hepatobiliary: Diffuse low attenuation throughout the hepatic parenchyma, indicative of a background of severe hepatic steatosis. No suspicious cystic or solid hepatic lesions. No intra or extrahepatic biliary ductal dilatation. Gallbladder is normal in appearance. Pancreas: No pancreatic mass. No pancreatic ductal dilatation. No pancreatic or peripancreatic fluid collections or inflammatory changes. Spleen: Unremarkable. Adrenals/Urinary Tract: Bilateral kidneys and bilateral adrenal glands are normal in appearance. No hydroureteronephrosis. Urinary bladder is normal in appearance. Stomach/Bowel: The appearance of the stomach is normal. No pathologic dilatation of small bowel or colon. Normal appendix. Vascular/Lymphatic: No significant atherosclerotic disease, aneurysm or dissection noted in the abdominal vasculature. Atherosclerotic calcifications are noted in the common femoral arteries bilaterally. No lymphadenopathy noted in the abdomen or pelvis. Reproductive: Prostate gland and seminal  vesicles are unremarkable in appearance. Other: No significant volume of ascites.  No pneumoperitoneum. Musculoskeletal: There are no aggressive appearing lytic or blastic lesions noted in the visualized portions of the skeleton. IMPRESSION: 1. No acute findings are noted in the abdomen or pelvis to account for the patient's symptoms. 2. Severe hepatic steatosis. 3. Atherosclerosis. Electronically Signed   By: Vinnie Langton M.D.   On: 08/26/2021 07:31   US Abdomen Limited RUQ (LIVER/GB)  Result Date: 09/07/2021 CLINICAL DATA:  Elevated LFTs. EXAM: ULTRASOUND ABDOMEN LIMITED RIGHT UPPER QUADRANT COMPARISON:  CT AP 08/26/2021 FINDINGS: Gallbladder: Gallbladder wall measures 3 mm in thickness. No gallstones or pericholecystic fluid. Sludge noted. Negative sonographic Murphy's sign. Common bile duct: Diameter: 5 mm Liver: Increased parenchymal echogenicity within the liver compatible with hepatic steatosis. Focal area of low-density adjacent to the gallbladder likely represents focal fatty infiltration. Portal vein is patent on color Doppler imaging with normal direction of blood flow towards the liver. Other: None. IMPRESSION: 1. Hepatic steatosis. 2. Gallbladder sludge. Wall thickness of the gallbladder is upper limits of normal at 3 mm. No gallstones, pericholecystic fluid or sonographic Murphy's sign. Electronically Signed   By: Kerby Moors M.D.   On: 09/07/2021 13:23

## 2021-09-22 NOTE — Plan of Care (Signed)
°  Problem: Education: Goal: Expressions of having a comfortable level of knowledge regarding the disease process will increase Outcome: Progressing   Problem: Safety: Goal: Verbalization of understanding the information provided will improve Outcome: Progressing   Problem: Education: Goal: Knowledge of General Education information will improve Description: Including pain rating scale, medication(s)/side effects and non-pharmacologic comfort measures Outcome: Progressing

## 2021-09-22 NOTE — Progress Notes (Signed)
Occupational Therapy Treatment Patient Details Name: [name] MRN: [id number] DOB: [birth year] Today's Date: 09/22/2021   History of present illness [name] is an 51 y.o. male past medical history of alcohol abuse and alcoholic cirrhosis, schizoaffective disorder with seizures who comes in with withdrawal symptoms thinking he may to have a seizure.  Had a witnessed seizure in the ED and was treated with Ativan and loaded with Keppra.   OT comments  Patient was able to complete bathing and grooming tasks with standing/sitting with min guard. Patient was educated on shower seats v.s. tub transfer bench. Patient to discuss with sister. Patient was educated on grab bar benefit in shower instead of using window ledge in shower. Patient to discuss this with sister as well. Patient would continue to benefit from skilled OT services at this time while admitted and after d/c to address noted deficits in order to improve overall safety and independence in ADLs.     Recommendations for follow up therapy are one component of a multi-disciplinary discharge planning process, led by the attending physician.  Recommendations may be updated based on patient status, additional functional criteria and insurance authorization.    Follow Up Recommendations  Home health OT    Assistance Recommended at Discharge Intermittent Supervision/Assistance  Patient can return home with the following  Assistance with cooking/housework;Direct supervision/assist for medications management;A little help with bathing/dressing/bathroom;A little help with walking and/or transfers   Equipment Recommendations  Tub/shower seat    Recommendations for Other Services      Precautions / Restrictions Precautions Precautions: Fall Precaution Comments: mild ataxia/LE tremors with mobility Restrictions Weight Bearing Restrictions: No       Mobility Bed Mobility Overal bed mobility: Modified Independent                   Transfers                         Balance Overall balance assessment: Mild deficits observed, not formally tested                                         ADL either performed or assessed with clinical judgement   ADL Overall ADL's : Needs assistance/impaired     Grooming: Oral care;Wash/dry face;Wash/dry hands;Brushing hair;Standing;Min guard Grooming Details (indicate cue type and reason): standing at sink with increased time. Upper Body Bathing: Set up;Sitting   Lower Body Bathing: Set up;Sitting/lateral leans;Min guard Lower Body Bathing Details (indicate cue type and reason): to complete LB Dressing tasks. min guard for standing with RW Upper Body Dressing : Set up;Sitting   Lower Body Dressing: Supervision/safety;Sit to/from stand Lower Body Dressing Details (indicate cue type and reason): don socks and shoes with lace management as well.   Toilet Transfer Details (indicate cue type and reason): patient was able to transfer from edge of bed to sink and then to recliner with min guard with RW.                Extremity/Trunk Assessment              Vision       Perception     Praxis      Cognition Arousal/Alertness: Awake/alert Behavior During Therapy: WFL for tasks assessed/performed Overall Cognitive Status: Within Functional Limits for tasks assessed  Exercises      Shoulder Instructions       General Comments      Pertinent Vitals/ Pain       Pain Assessment Pain Assessment: Faces Faces Pain Scale: Hurts a little bit Pain Location: L foot Pain Descriptors / Indicators: Discomfort Pain Intervention(s): Limited activity within patient's tolerance, Monitored during session, Premedicated before session  Home Living                                          Prior Functioning/Environment              Frequency  Min 2X/week         Progress Toward Goals  OT Goals(current goals can now be found in the care plan section)  Progress towards OT goals: Progressing toward goals     Plan Discharge plan needs to be updated    Co-evaluation                 AM-PAC OT "6 Clicks" Daily Activity     Outcome Measure   Help from another person eating meals?: None Help from another person taking care of personal grooming?: None Help from another person toileting, which includes using toliet, bedpan, or urinal?: A Little Help from another person bathing (including washing, rinsing, drying)?: A Little Help from another person to put on and taking off regular upper body clothing?: A Little Help from another person to put on and taking off regular lower body clothing?: A Little 6 Click Score: 20    End of Session Equipment Utilized During Treatment: Rolling walker (2 wheels);Gait belt  OT Visit Diagnosis: Unsteadiness on feet (R26.81);Muscle weakness (generalized) (M62.81)   Activity Tolerance Patient tolerated treatment well   Patient Left in chair;with call bell/phone within reach;with chair alarm set   Nurse Communication Mobility status        Time: 3419-6222 OT Time Calculation (min): 25 min  Charges: OT General Charges $OT Visit: 1 Visit OT Treatments $Self Care/Home Management : 23-37 mins  Jackelyn Poling OTR/L, MS Acute Rehabilitation Department Office# [phone] Pager# [phone]   Marcellina Millin 09/22/2021, 10:26 AM

## 2021-09-22 NOTE — Progress Notes (Signed)
Physical Therapy Treatment Patient Details Name: [name] MRN: [id number] DOB: [birth year] Today's Date: 09/22/2021   History of Present Illness [name] is an 51 y.o. male past medical history of alcohol abuse and alcoholic cirrhosis, schizoaffective disorder with seizures who comes in with withdrawal symptoms thinking he may to have a seizure.  Had a witnessed seizure in the ED and was treated with Ativan and loaded with Keppra.    PT Comments     Pt with increasing gait and activity tolerance. Reviewed stairs again for safety and strengthening/endurance. Pt remains motivated and is mobilizing with nursing staff in addition to PT. Will hopefully be ready to d/c home Monday or Tuesday    Recommendations for follow up therapy are one component of a multi-disciplinary discharge planning process, led by the attending physician.  Recommendations may be updated based on patient status, additional functional criteria and insurance authorization.  Follow Up Recommendations  Other (comment) (no benefits for [facility] or SNF)     Assistance Recommended at Discharge Intermittent Supervision/Assistance  Patient can return home with the following Help with stairs or ramp for entrance;Assistance with cooking/housework;Assist for transportation   Equipment Recommendations  None recommended by PT    Recommendations for Other Services       Precautions / Restrictions Precautions Precautions: Fall Precaution Comments: mild ataxia/LLE tremors with mobility Restrictions Weight Bearing Restrictions: No     Mobility  Bed Mobility Overal bed mobility: Modified Independent                  Transfers   Equipment used: Rolling walker (2 wheels) Transfers: Sit to/from Stand Sit to Stand: Supervision, Modified independent (Device/Increase time)           General transfer comment: supervision for safety on transition to RW.  pt self cues for hand placement and powering up with LEs.  demonstrating carryover from previous sessions    Ambulation/Gait Ambulation/Gait assistance: Min guard, Supervision Gait Distance (Feet): 200 Feet Assistive device: Rolling walker (2 wheels) Gait Pattern/deviations: Step-through pattern, Decreased stride length       General Gait Details: occasional verbal cues for RW position, trunk extension, stride length.  gait stability improving, increasing stance time on LLE nearing equal wt shift   Stairs   Stairs assistance: Min guard Stair Management: Two rails, Step to pattern, Forwards, Alternating pattern Number of Stairs: 7 (x2) General stair comments: cues for initial sequence and progression to reciprocal pattern. min/guard for safety. no LOB   Wheelchair Mobility    Modified Rankin (Stroke Patients Only)       Balance   Sitting-balance support: Feet supported, No upper extremity supported Sitting balance-Leahy Scale: Good     Standing balance support: Reliant on assistive device for balance Standing balance-Leahy Scale: Fair (able to briefly stand without UE support, wide BOS)                              Cognition Arousal/Alertness: Awake/alert Behavior During Therapy: WFL for tasks assessed/performed Overall Cognitive Status: Within Functional Limits for tasks assessed                                          Exercises      General Comments        Pertinent Vitals/Pain Pain Assessment Pain Assessment: Faces Faces Pain Scale: Hurts a  little bit Pain Location: L foot Pain Descriptors / Indicators: Discomfort Pain Intervention(s): Limited activity within patient's tolerance, Monitored during session, Premedicated before session    Home Living                          Prior Function            PT Goals (current goals can now be found in the care plan section) Acute Rehab PT Goals Patient Stated Goal: to get stronger PT Goal Formulation: With patient Time For  Goal Achievement: 09/26/21 Potential to Achieve Goals: Good Progress towards PT goals: Progressing toward goals    Frequency    Min 3X/week      PT Plan Current plan remains appropriate    Co-evaluation              AM-PAC PT "6 Clicks" Mobility   Outcome Measure  Help needed turning from your back to your side while in a flat bed without using bedrails?: None Help needed moving from lying on your back to sitting on the side of a flat bed without using bedrails?: None Help needed moving to and from a bed to a chair (including a wheelchair)?: None Help needed standing up from a chair using your arms (e.g., wheelchair or bedside chair)?: None Help needed to walk in hospital room?: A Little Help needed climbing 3-5 steps with a railing? : A Little 6 Click Score: 22    End of Session Equipment Utilized During Treatment: Gait belt Activity Tolerance: Patient tolerated treatment well Patient left: in bed;with call bell/phone within reach (no alarm on arrival) Nurse Communication: Mobility status PT Visit Diagnosis: Muscle weakness (generalized) (M62.81);Difficulty in walking, not elsewhere classified (R26.2)     Time: 1250-1309 PT Time Calculation (min) (ACUTE ONLY): 19 min  Charges:  $Gait Training: 8-22 mins                     Baxter Flattery, PT  Acute Rehab Dept ([place]) [phone] Pager [phone]  09/22/2021    [facility] 09/22/2021, 1:25 PM

## 2021-09-23 MED ORDER — THIAMINE HCL 100 MG PO TABS: 100.0000 mg | ORAL_TABLET | Freq: Every day | ORAL | 3 refills | Status: DC

## 2021-09-23 MED ORDER — QUETIAPINE FUMARATE 200 MG PO TABS: 200.0000 mg | ORAL_TABLET | Freq: Every day | ORAL | 1 refills | Status: DC

## 2021-09-23 MED ORDER — LACTULOSE 10 GM/15ML PO SOLN: 10.0000 g | Freq: Three times a day (TID) | ORAL | 1 refills | Status: DC

## 2021-09-23 MED ORDER — METOPROLOL TARTRATE 25 MG PO TABS: 12.5000 mg | ORAL_TABLET | Freq: Two times a day (BID) | ORAL | 2 refills | Status: DC

## 2021-09-23 MED ORDER — LEVETIRACETAM 500 MG PO TABS: 500.0000 mg | ORAL_TABLET | Freq: Two times a day (BID) | ORAL | 2 refills | Status: DC

## 2021-09-23 NOTE — Progress Notes (Signed)
Discharge package printed and instructions given to patient. Patient verbalizes understanding. 

## 2021-09-23 NOTE — Plan of Care (Signed)
°  Problem: Safety: Goal: Verbalization of understanding the information provided will improve Outcome: Progressing   Problem: Pain Managment: Goal: General experience of comfort will improve Outcome: Progressing

## 2021-09-23 NOTE — Progress Notes (Signed)
Physical Therapy Treatment Patient Details Name: [name] MRN: [id number] DOB: April 10, 1971 Today's Date: 09/23/2021   History of Present Illness Simeon Vera is an 51 y.o. male past medical history of alcohol abuse and alcoholic cirrhosis, schizoaffective disorder with seizures who comes in with withdrawal symptoms thinking he may to have a seizure.  Had a witnessed seizure in the ED and was treated with Ativan and loaded with Keppra.    PT Comments    Ronalee Belts is progressing well. He feels confident with going home, confident in his ability to maneuver the 2 steps into his house. He reports  his sister will assist with errands and groceries.  Dub Mikes to move a little more slowly and be aware of space constraints maneuvering with RW in the house.  He has made excellent progress in the last week.    Recommendations for follow up therapy are one component of a multi-disciplinary discharge planning process, led by the attending physician.  Recommendations may be updated based on patient status, additional functional criteria and insurance authorization.  Follow Up Recommendations  Other (comment) (no benefits for [facility] or SNF)     Assistance Recommended at Discharge Intermittent Supervision/Assistance  Patient can return home with the following Help with stairs or ramp for entrance;Assistance with cooking/housework;Assist for transportation   Equipment Recommendations  None recommended by PT    Recommendations for Other Services       Precautions / Restrictions Precautions Precautions: Fall Precaution Comments: mild ataxia/LLE tremors with mobility Restrictions Weight Bearing Restrictions: No     Mobility  Bed Mobility Overal bed mobility: Modified Independent                  Transfers Overall transfer level: Needs assistance Equipment used: Rolling walker (2 wheels) Transfers: Sit to/from Stand Sit to Stand: Modified independent (Device/Increase time)            General transfer comment: pt self cues for hand placement and powering up with LEs. demonstrating carryover from previous sessions    Ambulation/Gait Ambulation/Gait assistance: Min guard, Supervision Gait Distance (Feet): 220 Feet Assistive device: Rolling walker (2 wheels) Gait Pattern/deviations: Step-through pattern, Decreased stride length       General Gait Details: occasional verbal cues for RW position, to keep both feet inside RW especially with turns,  trunk extension, stride length.  gait stability improving, increasing stance time on LLE nearing equal wt shift; pt able to take a few steps without UE support.   Stairs   Stairs assistance: Min guard, Supervision Stair Management: Two rails, Forwards, Alternating pattern Number of Stairs: 5 (x2) General stair comments: cues for initial sequence and progression to reciprocal pattern. supervision  for safety. no LOB   Wheelchair Mobility    Modified Rankin (Stroke Patients Only)       Balance   Sitting-balance support: Feet supported, No upper extremity supported Sitting balance-Leahy Scale: Good     Standing balance support: Reliant on assistive device for balance Standing balance-Leahy Scale: Fair (able to briefly stand without UE support, wide BOS)                              Cognition Arousal/Alertness: Awake/alert Behavior During Therapy: WFL for tasks assessed/performed Overall Cognitive Status: Within Functional Limits for tasks assessed  Exercises      General Comments        Pertinent Vitals/Pain Pain Assessment Pain Assessment: Faces Faces Pain Scale: Hurts a little bit Pain Location: L foot Pain Descriptors / Indicators: Discomfort Pain Intervention(s): Limited activity within patient's tolerance, Premedicated before session, Monitored during session    Home Living                          Prior  Function            PT Goals (current goals can now be found in the care plan section) Acute Rehab PT Goals Patient Stated Goal: to get stronger PT Goal Formulation: With patient Time For Goal Achievement: 09/26/21 Potential to Achieve Goals: Good Progress towards PT goals: Progressing toward goals    Frequency    Min 3X/week      PT Plan Current plan remains appropriate    Co-evaluation              AM-PAC PT "6 Clicks" Mobility   Outcome Measure  Help needed turning from your back to your side while in a flat bed without using bedrails?: None Help needed moving from lying on your back to sitting on the side of a flat bed without using bedrails?: None Help needed moving to and from a bed to a chair (including a wheelchair)?: None Help needed standing up from a chair using your arms (e.g., wheelchair or bedside chair)?: None Help needed to walk in hospital room?: None Help needed climbing 3-5 steps with a railing? : A Little 6 Click Score: 23    End of Session Equipment Utilized During Treatment: Gait belt Activity Tolerance: Patient tolerated treatment well Patient left: with call bell/phone within reach;in chair;with nursing/sitter in room (no alarm on arrival) Nurse Communication: Mobility status PT Visit Diagnosis: Muscle weakness (generalized) (M62.81);Difficulty in walking, not elsewhere classified (R26.2)     Time: 8891-6945 PT Time Calculation (min) (ACUTE ONLY): 18 min  Charges:  $Gait Training: 8-22 mins                     Baxter Flattery, PT  Acute Rehab Dept ([place]) [phone] Pager [phone]  09/23/2021    [facility] 09/23/2021, 10:34 AM

## 2021-09-23 NOTE — Discharge Summary (Signed)
Physician Discharge Summary   Patient: [name] MRN: [id number] DOB: 10-29-1970  Admit date:     09/07/2021  Discharge date: 09/23/21  Discharge Physician: [name]   PCP: [name], MD   Recommendations at discharge:   Follow-up neurology in 4 weeks Follow-up PCP in 1 week Follow-up hematology in 2 weeks  Discharge Diagnoses: Principal Problem:   Alcohol withdrawal seizure with complication ([place]) Active Problems:   Hypokalemia   Schizoaffective disorder, depressive type ([place])   GERD (gastroesophageal reflux disease)   HTN (hypertension)   Macrocytic anemia   Hepatic steatosis  Resolved Problems:   * No resolved hospital problems. [facility] Course: [name] is a 51 yo male with PMH alcohol abuse, alcoholic cirrhosis, anxiety/depression, PTSD, Schizoaffective disorder, seizure disorder, HTN, GERD who presented to the hospital after developing withdrawal symptoms and thinking he may  have had a seizure at home. He endorsed drinking 4-5 "4 Loko's" per day.  On workup in the ER he was witnessed to have a seizure and was treated with ativan with good response followed by Keppra loading.  He was admitted for further alcohol withdrawal treatment.   Assessment and Plan:  Alcohol withdrawal seizure -Patient was started on Keppra and as needed Ativan -No more seizures in the hospital -I called and discussed with neurologist Dr. Reeves Forth, he recommends to continue with Keppra 500 mg p.o. twice daily and then follow-up with neurology as outpatient -Patient given seizure precautions instructions, no driving for 6 months  Alcohol abuse -Counseled for stopping alcohol   Acute gout flare -Significantly improved with prednisone -Involving multiple joints involving left ankle and left elbow -Uric acid obtained was elevated  7.3 -Patient was treated with  prednisone 40 mg daily for 5 days   "Hepatic steatosis -Secondary to alcohol abuse -LFTs improving    Hypertension -Continue Aldactone, metoprolol   Schizoaffective disorder -Continue Seroquel, Zoloft   Sacral decubitus ulcer, POA   Thrombocytosis -Platelet count 636 , was 245 on 09/13/2021 -Unclear etiology -In the past also he had intermittently high level of platelets since 2016 -Reviewed platelets elevated at 894 -Hematology consulted, hematology feels is likely reactive.  No treatment recommended. -Hematology will follow up as outpatient           Consultants: Hematology Procedures performed: None Disposition: Home Diet recommendation:  Discharge Diet Orders (From admission, onward)     Start     Ordered   09/23/21 0000  Diet - low sodium heart healthy        09/23/21 1134           Regular diet  DISCHARGE MEDICATION: Allergies as of 09/23/2021       Reactions   Bupropion Other (See Comments)   Caused seizures Caused seizures Feel as though I will have a seizure when taking this med        Medication List     STOP taking these medications    chlordiazePOXIDE 25 MG capsule Commonly known as: LIBRIUM       TAKE these medications    ascorbic acid 250 MG tablet Commonly known as: VITAMIN C Take 1 tablet (250 mg total) by mouth 2 (two) times daily.   gabapentin 300 MG capsule Commonly known as: NEURONTIN Take 1 capsule (300 mg total) by mouth 3 (three) times daily.   lactulose 10 GM/15ML solution Commonly known as: CHRONULAC Take 15 mLs (10 g total) by mouth 3 (three) times daily. What changed: how much to take  levETIRAcetam 500 MG tablet Commonly known as: KEPPRA Take 1 tablet (500 mg total) by mouth 2 (two) times daily.   metoprolol tartrate 25 MG tablet Commonly known as: LOPRESSOR Take 0.5 tablets (12.5 mg total) by mouth 2 (two) times daily.   MILK THISTLE PO Take 1 capsule by mouth 3 (three) times daily.   multivitamin with minerals Tabs tablet Take 1 tablet by mouth daily.   pantoprazole 40 MG tablet Commonly known  as: PROTONIX Take 1 tablet (40 mg total) by mouth 2 (two) times daily.   QUEtiapine 200 MG tablet Commonly known as: SEROquel Take 1 tablet (200 mg total) by mouth at bedtime.   sertraline 25 MG tablet Commonly known as: ZOLOFT Take 1 tablet (25 mg total) by mouth daily.   spironolactone 100 MG tablet Commonly known as: ALDACTONE Take 1 tablet (100 mg total) by mouth daily.   thiamine 100 MG tablet Take 1 tablet (100 mg total) by mouth daily. Start taking on: September 24, 2021         Discharge Exam: Danley Danker Weights   09/07/21 0900 09/07/21 1500  Weight: 88.5 kg 94.7 kg   General-appears in no acute distress Heart-S1-S2, regular, no murmur auscultated Lungs-clear to auscultation bilaterally, no wheezing or crackles auscultated Abdomen-soft, nontender, no organomegaly Extremities-no edema in the lower extremities Neuro-alert, oriented x3, no focal deficit noted  Condition at discharge: good  The results of significant diagnostics from this hospitalization (including imaging, microbiology, ancillary and laboratory) are listed below for reference.   Imaging Studies: CT Head Wo Contrast  Result Date: 09/07/2021 CLINICAL DATA:  Seizure. EXAM: CT HEAD WITHOUT CONTRAST TECHNIQUE: Contiguous axial images were obtained from the base of the skull through the vertex without intravenous contrast. RADIATION DOSE REDUCTION: This exam was performed according to the departmental dose-optimization program which includes automated exposure control, adjustment of the mA and/or kV according to patient size and/or use of iterative reconstruction technique. COMPARISON:  08/26/2021 FINDINGS: Brain: No evidence of acute infarction, hemorrhage, hydrocephalus, extra-axial collection or mass lesion/mass effect. Vascular: No hyperdense vessel or unexpected calcification. Skull: Normal. Negative for fracture or focal lesion. Sinuses/Orbits: No acute finding. Retention cyst or polyp noted in the right  maxillary sinus. Other: None. IMPRESSION: 1. No acute intracranial abnormalities. 2. Right maxillary sinus retention cyst or polyp. Electronically Signed   By: Kerby Moors M.D.   On: 09/07/2021 09:20   CT HEAD WO CONTRAST (5MM)  Result Date: 08/26/2021 CLINICAL DATA:  51 year old male with headache and possible seizure. Cirrhosis. EXAM: CT HEAD WITHOUT CONTRAST TECHNIQUE: Contiguous axial images were obtained from the base of the skull through the vertex without intravenous contrast. COMPARISON:  Brain MRI 11/03/2014.  Head CT 07/28/2021. FINDINGS: Brain: Stable cerebral volume. No midline shift, ventriculomegaly, mass effect, evidence of mass lesion, intracranial hemorrhage or evidence of cortically based acute infarction. Gray-white matter differentiation is within normal limits throughout the brain. No encephalomalacia identified. Vascular: Mild Calcified atherosclerosis at the skull base. No suspicious intracranial vascular hyperdensity. Skull: Negative. Sinuses/Orbits: Visualized paranasal sinuses and mastoids are clear. Other: Rightward gaze.  Scalp soft tissues within normal limits. IMPRESSION: Stable and negative noncontrast CT appearance of the brain. Electronically Signed   By: Genevie Ann M.D.   On: 08/26/2021 07:25   CT Cervical Spine Wo Contrast  Result Date: 08/26/2021 CLINICAL DATA:  51 year old male with headache and possible seizure. Cirrhosis. EXAM: CT CERVICAL SPINE WITHOUT CONTRAST TECHNIQUE: Multidetector CT imaging of the cervical spine was performed without intravenous contrast. Multiplanar  CT image reconstructions were also generated. COMPARISON:  Head CT today.  Cervical spine CT 05/03/2020. FINDINGS: Alignment: Stable straightening and mild reversal of cervical lordosis. Cervicothoracic junction alignment is within normal limits. Bilateral posterior element alignment is within normal limits. Skull base and vertebrae: Visualized skull base is intact. No atlanto-occipital dissociation.  C1 and C2 appear intact. No acute osseous abnormality identified. Soft tissues and spinal canal: No prevertebral fluid or swelling. No visible canal hematoma. Faint calcified carotid atherosclerosis, otherwise negative visible noncontrast neck soft tissues. Disc levels: Chronic C4-C5 interbody ankylosis from bulky anterior endplate osteophyte. Bulky anterior osteophytes also at C5-C6. Only mild disc space loss. No cervical spinal stenosis suspected. Upper chest: Negative. IMPRESSION: 1. No acute traumatic injury identified in the cervical spine. 2. Chronic C4-C5 interbody ankylosis from bulky endplate osteophytes. Electronically Signed   By: Genevie Ann M.D.   On: 08/26/2021 07:29   CT ABDOMEN PELVIS W CONTRAST  Result Date: 08/26/2021 CLINICAL DATA:  51 year old male with history of epigastric pain. EXAM: CT ABDOMEN AND PELVIS WITH CONTRAST TECHNIQUE: Multidetector CT imaging of the abdomen and pelvis was performed using the standard protocol following bolus administration of intravenous contrast. CONTRAST:  78mL OMNIPAQUE IOHEXOL 350 MG/ML SOLN COMPARISON:  CT the abdomen and pelvis 09/30/2015. FINDINGS: Lower chest: Unremarkable. Hepatobiliary: Diffuse low attenuation throughout the hepatic parenchyma, indicative of a background of severe hepatic steatosis. No suspicious cystic or solid hepatic lesions. No intra or extrahepatic biliary ductal dilatation. Gallbladder is normal in appearance. Pancreas: No pancreatic mass. No pancreatic ductal dilatation. No pancreatic or peripancreatic fluid collections or inflammatory changes. Spleen: Unremarkable. Adrenals/Urinary Tract: Bilateral kidneys and bilateral adrenal glands are normal in appearance. No hydroureteronephrosis. Urinary bladder is normal in appearance. Stomach/Bowel: The appearance of the stomach is normal. No pathologic dilatation of small bowel or colon. Normal appendix. Vascular/Lymphatic: No significant atherosclerotic disease, aneurysm or dissection  noted in the abdominal vasculature. Atherosclerotic calcifications are noted in the common femoral arteries bilaterally. No lymphadenopathy noted in the abdomen or pelvis. Reproductive: Prostate gland and seminal vesicles are unremarkable in appearance. Other: No significant volume of ascites.  No pneumoperitoneum. Musculoskeletal: There are no aggressive appearing lytic or blastic lesions noted in the visualized portions of the skeleton. IMPRESSION: 1. No acute findings are noted in the abdomen or pelvis to account for the patient's symptoms. 2. Severe hepatic steatosis. 3. Atherosclerosis. Electronically Signed   By: Vinnie Langton M.D.   On: 08/26/2021 07:31   US Abdomen Limited RUQ (LIVER/GB)  Result Date: 09/07/2021 CLINICAL DATA:  Elevated LFTs. EXAM: ULTRASOUND ABDOMEN LIMITED RIGHT UPPER QUADRANT COMPARISON:  CT AP 08/26/2021 FINDINGS: Gallbladder: Gallbladder wall measures 3 mm in thickness. No gallstones or pericholecystic fluid. Sludge noted. Negative sonographic Murphy's sign. Common bile duct: Diameter: 5 mm Liver: Increased parenchymal echogenicity within the liver compatible with hepatic steatosis. Focal area of low-density adjacent to the gallbladder likely represents focal fatty infiltration. Portal vein is patent on color Doppler imaging with normal direction of blood flow towards the liver. Other: None. IMPRESSION: 1. Hepatic steatosis. 2. Gallbladder sludge. Wall thickness of the gallbladder is upper limits of normal at 3 mm. No gallstones, pericholecystic fluid or sonographic Murphy's sign. Electronically Signed   By: Kerby Moors M.D.   On: 09/07/2021 13:23    Microbiology: Results for orders placed or performed during the hospital encounter of 09/07/21  Resp Panel by RT-PCR (Flu A&B, Covid) Nasopharyngeal Swab     Status: None   Collection Time: 09/07/21  7:26 AM  Specimen: Nasopharyngeal Swab; Nasopharyngeal(NP) swabs in vial transport medium  Result Value Ref Range Status    SARS Coronavirus 2 by RT PCR NEGATIVE NEGATIVE Final    Comment: (NOTE) SARS-CoV-2 target nucleic acids are NOT DETECTED.  The SARS-CoV-2 RNA is generally detectable in upper respiratory specimens during the acute phase of infection. The lowest concentration of SARS-CoV-2 viral copies this assay can detect is 138 copies/mL. A negative result does not preclude SARS-Cov-2 infection and should not be used as the sole basis for treatment or other patient management decisions. A negative result may occur with  improper specimen collection/handling, submission of specimen other than nasopharyngeal swab, presence of viral mutation(s) within the areas targeted by this assay, and inadequate number of viral copies(<138 copies/mL). A negative result must be combined with clinical observations, patient history, and epidemiological information. The expected result is Negative.  Fact Sheet for Patients:  EntrepreneurPulse.com.au  Fact Sheet for Healthcare Providers:  IncredibleEmployment.be  This test is no t yet approved or cleared by the [place] FDA and  has been authorized for detection and/or diagnosis of SARS-CoV-2 by FDA under an Emergency Use Authorization (EUA). This EUA will remain  in effect (meaning this test can be used) for the duration of the COVID-19 declaration under Section 564(b)(1) of the Act, 21 U.S.C.section 360bbb-3(b)(1), unless the authorization is terminated  or revoked sooner.       Influenza A by PCR NEGATIVE NEGATIVE Final   Influenza B by PCR NEGATIVE NEGATIVE Final    Comment: (NOTE) The Xpert Xpress SARS-CoV-2/FLU/RSV plus assay is intended as an aid in the diagnosis of influenza from Nasopharyngeal swab specimens and should not be used as a sole basis for treatment. Nasal washings and aspirates are unacceptable for Xpert Xpress SARS-CoV-2/FLU/RSV testing.  Fact Sheet for  Patients: EntrepreneurPulse.com.au  Fact Sheet for Healthcare Providers: IncredibleEmployment.be  This test is not yet approved or cleared by the [place] FDA and has been authorized for detection and/or diagnosis of SARS-CoV-2 by FDA under an Emergency Use Authorization (EUA). This EUA will remain in effect (meaning this test can be used) for the duration of the COVID-19 declaration under Section 564(b)(1) of the Act, 21 U.S.C. section 360bbb-3(b)(1), unless the authorization is terminated or revoked.  Performed at [facility], [place] [address], [place], [place] [address]   MRSA Next Gen by PCR, Nasal     Status: None   Collection Time: 09/07/21  1:52 PM   Specimen: Nasal Mucosa; Nasal Swab  Result Value Ref Range Status   MRSA by PCR Next Gen NOT DETECTED NOT DETECTED Final    Comment: (NOTE) The GeneXpert MRSA Assay (FDA approved for NASAL specimens only), is one component of a comprehensive MRSA colonization surveillance program. It is not intended to diagnose MRSA infection nor to guide or monitor treatment for MRSA infections. Test performance is not FDA approved in patients less than 48 years old. Performed at [facility], [place] [address], [place],  [address]     Labs: CBC: Recent Labs  Lab 09/18/21 0944 09/20/21 1419 09/22/21 1100  WBC 10.4 15.5* 15.8*  HGB 12.0* 11.7* 12.5*  HCT 37.1* 36.5* 39.4  MCV 102.2* 102.0* 104.2*  PLT 636* 762* 202*   Basic Metabolic Panel: Recent Labs  Lab 09/18/21 0944 09/20/21 1419  NA 133* 137  K 4.5 4.7  CL 98 104  CO2 26 24  GLUCOSE 109* 152*  BUN 17 30*  CREATININE 0.96 1.05  CALCIUM 9.9  9.8   Liver Function Tests: Recent Labs  Lab 09/18/21 0944  AST 78*  ALT 99*  ALKPHOS 180*  BILITOT 0.8  PROT 7.7  ALBUMIN 3.5   CBG: No results for input(s): GLUCAP in the last 168 hours.  Discharge time spent: greater than 30  minutes.  Signed: [name], MD [facility] 09/23/2021

## 2021-09-24 ENCOUNTER — Telehealth: Payer: Self-pay

## 2021-09-24 NOTE — Telephone Encounter (Signed)
Transition Care Management Unsuccessful Follow-up Telephone Call  Date of discharge and from where:  09/23/2021, [facility]  Attempts:  1st Attempt  Reason for unsuccessful TCM follow-up call:  Voice mail full # [phone].   Need to discuss scheduling a follow up appointment with PCP

## 2021-09-25 ENCOUNTER — Telehealth: Payer: Self-pay

## 2021-09-25 NOTE — Telephone Encounter (Signed)
Transition Care Management Unsuccessful Follow-up Telephone Call  Date of discharge and from where:  09/23/2021, [facility]  Attempts:  2nd Attempt  Reason for unsuccessful TCM follow-up call:  Voice mail full # [phone].  Need to discuss scheduling a follow up appointment with PCP

## 2021-09-26 ENCOUNTER — Telehealth: Payer: Self-pay

## 2021-09-26 NOTE — Telephone Encounter (Signed)
Transition Care Management Unsuccessful Follow-up Telephone Call  Date of discharge and from where:  09/23/2021, [facility]   Attempts:  3rd Attempt  Reason for unsuccessful TCM follow-up call:  Voice mail full # [phone].  Letter sent to patient requesting he contact Primary Care at [facility] to schedule a follow up appointment as we have not been able to reach him

## 2021-10-15 ENCOUNTER — Encounter: Payer: Self-pay | Admitting: Rehabilitative and Restorative Service Providers"

## 2021-10-21 ENCOUNTER — Inpatient Hospital Stay: Payer: Medicare Other | Attending: Hematology and Oncology

## 2021-10-21 ENCOUNTER — Inpatient Hospital Stay: Payer: Medicare Other | Admitting: Hematology and Oncology

## 2021-11-29 ENCOUNTER — Emergency Department (HOSPITAL_COMMUNITY)
Admission: EM | Admit: 2021-11-29 | Discharge: 2021-11-29 | Disposition: A | Discharge status: Home / Self Care | Payer: Medicare Other | Attending: Emergency Medicine | Admitting: Emergency Medicine

## 2021-11-29 ENCOUNTER — Encounter (HOSPITAL_COMMUNITY): Payer: Self-pay | Admitting: Emergency Medicine

## 2021-11-29 ENCOUNTER — Other Ambulatory Visit: Payer: Self-pay

## 2021-11-29 DIAGNOSIS — R112 Nausea with vomiting, unspecified: Secondary | ICD-10-CM

## 2021-11-29 DIAGNOSIS — Y907 Blood alcohol level of 200-239 mg/100 ml: Secondary | ICD-10-CM | POA: Diagnosis not present

## 2021-11-29 DIAGNOSIS — I10 Essential (primary) hypertension: Secondary | ICD-10-CM | POA: Diagnosis not present

## 2021-11-29 DIAGNOSIS — R1013 Epigastric pain: Secondary | ICD-10-CM

## 2021-11-29 DIAGNOSIS — F1010 Alcohol abuse, uncomplicated: Secondary | ICD-10-CM | POA: Diagnosis present

## 2021-11-29 LAB — COMPREHENSIVE METABOLIC PANEL
ALT: 27 U/L (ref 0–44)
AST: 32 U/L (ref 15–41)
Albumin: 4.6 g/dL (ref 3.5–5.0)
Alkaline Phosphatase: 124 U/L (ref 38–126)
Anion gap: 16 — ABNORMAL HIGH (ref 5–15)
BUN: 5 mg/dL — ABNORMAL LOW (ref 6–20)
CO2: 23 mmol/L (ref 22–32)
Calcium: 9 mg/dL (ref 8.9–10.3)
Chloride: 100 mmol/L (ref 98–111)
Creatinine, Ser: 0.78 mg/dL (ref 0.61–1.24)
GFR, Estimated: >60 mL/min (ref >60)
Glucose, Bld: 168 mg/dL — ABNORMAL HIGH (ref 70–99)
Potassium: 3.5 mmol/L (ref 3.5–5.1)
Sodium: 139 mmol/L (ref 135–145)
Total Bilirubin: 1.2 mg/dL (ref 0.3–1.2)
Total Protein: 7.9 g/dL (ref 6.5–8.1)

## 2021-11-29 LAB — CBC
HCT: 43.8 % (ref 39.0–52.0)
Hemoglobin: 15.3 g/dL (ref 13.0–17.0)
MCH: 33.2 pg (ref 26.0–34.0)
MCHC: 34.9 g/dL (ref 30.0–36.0)
MCV: 95 fL (ref 80.0–100.0)
Platelets: 191 10*3/uL (ref 150–400)
RBC: 4.61 MIL/uL (ref 4.22–5.81)
RDW: 15.8 % — ABNORMAL HIGH (ref 11.5–15.5)
WBC: 7.1 10*3/uL (ref 4.0–10.5)
nRBC: 0 % (ref 0.0–0.2)

## 2021-11-29 LAB — LIPASE, BLOOD: Lipase: 32 U/L (ref 11–51)

## 2021-11-29 LAB — ETHANOL: Alcohol, Ethyl (B): 238 mg/dL — ABNORMAL HIGH (ref <10)

## 2021-11-29 MED ORDER — ONDANSETRON 4 MG PO TBDP: 4.0000 mg | ORAL_TABLET | Freq: Three times a day (TID) | ORAL | 0 refills | Status: DC | PRN

## 2021-11-29 MED ORDER — PANTOPRAZOLE SODIUM 40 MG PO TBEC: 40.0000 mg | DELAYED_RELEASE_TABLET | Freq: Every day | ORAL | 0 refills | Status: DC

## 2021-11-29 MED ORDER — ONDANSETRON HCL 4 MG/2ML IJ SOLN
4.0000 mg | Freq: Once | INTRAMUSCULAR | Status: AC
  Administered 2021-11-29: 4 mg via INTRAVENOUS
  Filled 2021-11-29: qty 2

## 2021-11-29 MED ORDER — CHLORDIAZEPOXIDE HCL 25 MG PO CAPS: ORAL_CAPSULE | ORAL | 0 refills | Status: DC

## 2021-11-29 MED ORDER — LORAZEPAM 2 MG/ML IJ SOLN
1.0000 mg | Freq: Once | INTRAMUSCULAR | Status: AC
  Administered 2021-11-29: 1 mg via INTRAVENOUS
  Filled 2021-11-29: qty 1

## 2021-11-29 MED ORDER — SODIUM CHLORIDE 0.9 % IV BOLUS
1000.0000 mL | Freq: Once | INTRAVENOUS | Status: AC
  Administered 2021-11-29: 1000 mL via INTRAVENOUS

## 2021-11-29 NOTE — ED Provider Notes (Signed)
? ?Emergency Department Provider Note ? ? ?I have reviewed the triage vital signs and the nursing notes. ? ? ?HISTORY ? ?Chief Complaint ?Abdominal Pain ? ? ?HPI ?[name] is a 51 y.o. male with PMH of EtOH abuse and withdrawal seizure presents to the ED with abdominal pain and EtOH intoxication. Patient continues to drink regularly. He describes a general desire to stop drinking telling me "I have to" but has no plans to seek treatment or begin medical therapy at this time.  He is having mainly epigastric pain and nausea with vomiting. No CP. No SOB.  ? ? ?Past Medical History:  ?Diagnosis Date  ? Alcohol abuse   ? Anxiety   ? at age 74  ? Blood transfusion without reported diagnosis   ? Cirrhosis ([place])   ? Depression   ? at age 36  ? GERD (gastroesophageal reflux disease)   ? Hypertension   ? Psychosis ([place])   ? PTSD (post-traumatic stress disorder)   ? Schizoaffective disorder   ? Seizure disorder ([place])   ? related to etoh seizure  ? Symptomatic anemia   ? ? ?Review of Systems ? ?Constitutional: No fever/chills ?Eyes: No visual changes. ?ENT: No sore throat. ?Cardiovascular: Denies chest pain. ?Respiratory: Denies shortness of breath. ?Gastrointestinal: Positive epigastric abdominal pain. Positive nausea and vomiting.  No diarrhea.  No constipation. ?Genitourinary: Negative for dysuria. ?Musculoskeletal: Negative for back pain. ?Skin: Negative for rash. ?Neurological: Negative for headaches, focal weakness or numbness. ? ?____________________________________________ ? ? ?PHYSICAL EXAM: ? ?VITAL SIGNS: ?ED Triage Vitals  ?Enc Vitals Group  ?   BP 11/29/21 0117 113/86  ?   Pulse Rate 11/29/21 0117 99  ?   Resp 11/29/21 0117 18  ?   Temp 11/29/21 0117 98.3 ?F (36.8 ?C)  ?   Temp Source 11/29/21 0117 Oral  ?   SpO2 11/29/21 0117 92 %  ?   Weight 11/29/21 0114 200 lb (90.7 kg)  ?   Height 11/29/21 0114 6' (1.829 m)  ? ?Constitutional: Alert. Well appearing and in no acute distress. No tremors.  ?Eyes:  Conjunctivae are normal. ?Head: Atraumatic. ?Nose: No congestion/rhinnorhea. ?Mouth/Throat: Mucous membranes are moist. ?Neck: No stridor.  ?Cardiovascular: Normal rate, regular rhythm. Good peripheral circulation. Grossly normal heart sounds.   ?Respiratory: Normal respiratory effort.  No retractions. Lungs CTAB. ?Gastrointestinal: Soft and nontender. No distention.  ?Musculoskeletal: No lower extremity tenderness nor edema. No gross deformities of extremities. ?Neurologic:  Normal speech and language. No gross focal neurologic deficits are appreciated.  ?Skin:  Skin is warm, dry and intact. No rash noted. ? ?____________________________________________ ?  ?LABS ?(all labs ordered are listed, but only abnormal results are displayed) ? ?Labs Reviewed  ?COMPREHENSIVE METABOLIC PANEL - Abnormal; Notable for the following components:  ?    Result Value  ? Glucose, Bld 168 (*)   ? BUN 5 (*)   ? Anion gap 16 (*)   ? All other components within normal limits  ?CBC - Abnormal; Notable for the following components:  ? RDW 15.8 (*)   ? All other components within normal limits  ?ETHANOL - Abnormal; Notable for the following components:  ? Alcohol, Ethyl (B) 238 (*)   ? All other components within normal limits  ?LIPASE, BLOOD  ?URINALYSIS, ROUTINE W REFLEX MICROSCOPIC  ? ? ?____________________________________________ ? ? ?PROCEDURES ? ?Procedure(s) performed:  ? ?Procedures ? ?None  ?____________________________________________ ? ? ?INITIAL IMPRESSION / ASSESSMENT AND PLAN / ED COURSE ? ?Pertinent labs & imaging  results that were available during my care of the patient were reviewed by me and considered in my medical decision making (see chart for details). ?  ?This patient is Presenting for Evaluation of abdominal pain, which does require a range of treatment options, and is a complaint that involves a high risk of morbidity and mortality. ? ?The Differential Diagnoses includes but is not exclusive to acute cholecystitis,  intrathoracic causes for epigastric abdominal pain, gastritis, duodenitis, pancreatitis, small bowel or large bowel obstruction, abdominal aortic aneurysm, hernia, gastritis, etc. ? ? ?Critical Interventions-  ?  ?Medications  ?sodium chloride 0.9 % bolus 1,000 mL (0 mLs Intravenous Stopped 11/29/21 0511)  ?ondansetron [facility]) injection 4 mg (4 mg Intravenous Given 11/29/21 0409)  ?LORazepam (ATIVAN) injection 1 mg (1 mg Intravenous Given 11/29/21 0408)  ? ? ?Reassessment after intervention: Symptoms improved.  ? ? ?I decided to review pertinent External Data, and in summary patient with EtOH withdrawal seizure in January/Feb of this year. ?  ?Clinical Laboratory Tests Ordered, included EtOH at 238. No leukocytosis. Lipase normal. No AKI or severe electrolyte disturbance.  ? ?Radiologic Tests: Considered CT imaging but labs are reassuring and patient's abdomen is diffusely soft and non-tender.  ? ?Cardiac Monitor Tracing which shows NSR. ? ? ?Social Determinants of Health Risk patient is an alcoholic.  ? ?Medical Decision Making: Summary:  ?Patient presents to the emergency department with epigastric abdominal pain along with nausea vomiting.  No clinical signs of withdrawal.  Vital signs within normal limits.  No stigmata of seizure.  Patient expresses a general desire in theory to stop drinking but upon further discussion has no clear plan for moving forward.  He is not interested in referral or withdrawal medication at this time. Abd is diffusely soft and non-tender. Do not plan on imaging at this time.  ? ?Reevaluation with update and discussion with patient. Labs and vitals reassuring. No findings to suspect acute EtOh withdrawal. Will send Rx to pharmacy for Librium and provided a list of community resources for substance abuse recovery. Discussed strict ED return.  ? ?Disposition: discharge ? ?____________________________________________ ? ?FINAL CLINICAL IMPRESSION(S) / ED DIAGNOSES ? ?Final diagnoses:   ?Epigastric pain  ?Nausea and vomiting, unspecified vomiting type  ?Alcohol abuse  ? ? ? ?NEW OUTPATIENT MEDICATIONS STARTED DURING THIS VISIT: ? ?Current Discharge Medication List  ?  ? ?START taking these medications  ? Details  ?chlordiazePOXIDE (LIBRIUM) 25 MG capsule '50mg'$  PO TID x 1D, then 25-'50mg'$  PO BID X 1D, then 25-'50mg'$  PO QD X 1D ?Qty: 10 capsule, Refills: 0  ?  ?ondansetron (ZOFRAN-ODT) 4 MG disintegrating tablet Take 1 tablet (4 mg total) by mouth every 8 (eight) hours as needed for nausea or vomiting. ?Qty: 20 tablet, Refills: 0  ?  ?  ? ? ?Note:  This document was prepared using Dragon voice recognition software and may include unintentional dictation errors. ? ?Nanda Quinton, MD, FACEP ?Emergency Medicine ? ?  ?Margette Fast, MD ?11/29/21 [phone] ? ?

## 2021-11-29 NOTE — Discharge Instructions (Addendum)
Substance Abuse Treatment Programs ° °Intensive Outpatient Programs °[facility]     °[address]      °[place], [place]                   °[phone]      ° °[facility] °[address] °[place], [place] °[phone] ° °[facility]     °(Inpatient and outpatient)     °[address]           °[phone]   ° °[facility] °[phone] (Suboxone and Methadone) ° °[address]      °[place], [place] [address]      °[phone]      ° °[address] °[place], [place] °[phone] ° °[facility] (Outpatient/Inpatient, Chemical)    °(insurance only) [phone]      °       °[facility] (Groups & Residential) °[place], [place] °[phone] ° °   °[facility]     °[address]     °[place], [place]      °[phone]      ° °[facility] (for caregivers and family) °[address] °[place], [place] °336-299-4655 ° ° ° ° ° °Residential Treatment Programs °[facility]      °[address], [place], [place] [address]  °([phone]      ° °[facility] °[address] [place], [place] [address] °[phone] ° °[facility]        °336-248-8914      ° °[facility] °[phone] ° °[facility]             °[address]                                         °[place], [place]                                                °877-615-2722 or 336-784-9470                              ° °[facility] [place] °[address] °[place], [address] °1.877.941.8954 ° °[facility]    °[address]      °[place], [place]     °[phone]      ° °[facility] °[address] °, [place] °[phone] ° °[facility]   °[address]     °[place], [place] [address]     °[phone]      °Admissions: 8am-3pm M-F ° °[facility] °[address] °[place],  [place] °336-227-7417 ° °[facility]: Residential Program (90 Days)   °[place], [place]      °336-725-8389 or 800-758-6077    ° °[facility] °[place], [place] °(Walk in Hours over the weekend or by referral) ° °[facility] °[address], [place], [place] [address] °([phone] ° °Crisis Mobile: [facility]:  1-877-626-1772 (for crisis response 24 hours a day) °[facility] Hotline:      1-800-256-2452 °Outpatient Psychiatry and Counseling ° °[facility]: Mobile Crisis   Management 24 hours:  1-877-626-1772 ° °[facility] sliding scale fee and walk in schedule: M-F 8am-12pm/1pm-3pm °[address]  °[place], [place] [address] °[phone] ° °[facility] °[address] °[place], [place] [address] °[phone] ° °[facility] ([facility])- new patient walk-in appointments available Monday - Friday 8am -3pm.          °[address] °[place], [place] [address] °336-676-6840 or crisis line- 336-676-6905 ° °[facility]/ Intensive Outpatient Therapy Program °[address] °[place], [place] [address] °336-832-9804 ° °[facility]                  °Crisis Services      °[phone]      °[address]     °[place], [place] [address]                ° °[facility]   °[facility] °[phone] °[address] °[place], [place] [address] ° ° °[facility]          °[address]  °[place], [place] [address]       °([phone] ° °[facility] °[address] °[place], [place] [address] °336-292-1510 ° °[facility]    °[address]    °[place], [place] [address]     °[phone]      ° °Parish McKinney, MD     °[address]     °[place] [address]     °336-282-1251     °  °[facility] °[address] °[place] [address] ° °[facility]     °[address]     °[place], [place]      °336-542-2076      ° °[facility] °Shamsher Ahluwalia, MD °[address] °[place], [place] [address] °[phone] ° °[facility]     °[address]     °[place], [place] [address]     °336-274-1237      ° °[facility] °[address] °[place], [place] [address] °336-854-4450 °Resources for Temporary Residential Assistance/Crisis Centers ° °DAY CENTERS °[facility] °M-F 8am-3pm   °[address] [place], [place] [address]   336-332-0824 °Services include: laundry, barbering, support groups, case management, phone  & computer access, showers, AA/NA mtgs, mental health/substance abuse nurse, job skills class, disability information, VA assistance, spiritual classes, etc.  ° °HOMELESS SHELTERS ° °[facility]     °[facility]   °[address], [place]     °[phone]       °       °[facility] (women and children)       °[address] °[place], [place] [address] °336-275-0820 °[email or web address] for application and process °Application Required ° °[facility]   °[address]    °[place] [address]     °[phone]       °             °[facility] °[address] °[place],  [address] °[phone] °336-235-0363(schedule application appt.) °Application Required ° °[facility] (women only)    °[address]     °[place],  [address]     °336-884-1039      °  Intake starts 6pm daily °Need valid ID, SSC, & Police report °[facility] [place] °[address] °[place], [place] °336-881-5420 °Application Required ° °[facility] (men only)     °[address]      °[place], [place]     °[phone]      ° °[facility] °(Pregnant women only) °[address] °[place], [place] °336-275-0206 ° °[facility]      °[address]      °[place], [place] [address]     °[phone]      °       °[facility] °[address] °[place], [place] °336-723-1848 °90 day commitment/SA/Application process ° °[facility](men only)     °[address]     °[place], [place]     °[phone]       °Check-in at 7pm     °       °[facility] °[address] °[place], [place] [address] °336-248-6684 °Men/Women/Women and Children must be there by 7 pm ° °[facility] °[place],  °[phone]                ° °

## 2021-11-29 NOTE — ED Triage Notes (Signed)
Patient arrived with complaints of generalized abdominal pain, NV over the last few days. Reports daily drinker, last drink yesterday ?

## 2021-11-29 NOTE — ED Notes (Signed)
Discharge instructions reviewed, questions answered. Rx education provided. Pt states understanding and no further questions. Pt ambulatory with steady gait upon discharge. No s/s of distress noted. ? ?

## 2021-12-05 ENCOUNTER — Ambulatory Visit (INDEPENDENT_AMBULATORY_CARE_PROVIDER_SITE_OTHER): Payer: Medicare Other | Admitting: Family Medicine

## 2021-12-05 ENCOUNTER — Ambulatory Visit: Payer: Self-pay

## 2021-12-05 ENCOUNTER — Encounter: Payer: Self-pay | Admitting: Family Medicine

## 2021-12-05 VITALS — BP 124/87 | HR 87 | Temp 98.1°F | Resp 16 | Wt 225.8 lb

## 2021-12-05 DIAGNOSIS — R2689 Other abnormalities of gait and mobility: Secondary | ICD-10-CM

## 2021-12-05 DIAGNOSIS — G629 Polyneuropathy, unspecified: Secondary | ICD-10-CM | POA: Diagnosis not present

## 2021-12-05 DIAGNOSIS — H1032 Unspecified acute conjunctivitis, left eye: Secondary | ICD-10-CM

## 2021-12-05 DIAGNOSIS — F251 Schizoaffective disorder, depressive type: Secondary | ICD-10-CM | POA: Diagnosis not present

## 2021-12-05 DIAGNOSIS — I10 Essential (primary) hypertension: Secondary | ICD-10-CM

## 2021-12-05 MED ORDER — NEOMYCIN-POLYMYXIN-HC OP SUSP: 2.0000 [drp] | Freq: Four times a day (QID) | OPHTHALMIC | 0 refills | Status: DC

## 2021-12-05 MED ORDER — QUETIAPINE FUMARATE 200 MG PO TABS: 200.0000 mg | ORAL_TABLET | Freq: Every day | ORAL | 0 refills | Status: DC

## 2021-12-05 MED ORDER — SPIRONOLACTONE 100 MG PO TABS: 100.0000 mg | ORAL_TABLET | Freq: Every day | ORAL | 1 refills | Status: DC

## 2021-12-05 MED ORDER — SERTRALINE HCL 25 MG PO TABS: 25.0000 mg | ORAL_TABLET | Freq: Every day | ORAL | 0 refills | Status: DC

## 2021-12-05 NOTE — Telephone Encounter (Signed)
Mitzi Hansen from [facility] stated they received Rx for medication NEOMYCIN-POLYMYXIN-HC, OPHTH, SUSP cannot get medication in stock asking if can switch to neomycin-poly-hc eye drops.  ? ?Pharmacy requesting a call back  ? ? ? ?Phone: [phone] ?Pharmacy asking to substitute with neomycin-poly-dexamethasone . Please advise. ? ? ?Answer Assessment - Initial Assessment Questions ?1. NAME of MEDICATION: "What medicine are you calling about?" ?    Neomycin poly ?2. QUESTION: "What is your question?" (e.g., double dose of medicine, side effect) ?    Not in stock ?3. PRESCRIBING HCP: "Who prescribed it?" Reason: if prescribed by specialist, call should be referred to that group. ?    Dr. Redmond Pulling ?4. SYMPTOMS: "Do you have any symptoms?" ?    N/a ?5. SEVERITY: If symptoms are present, ask "Are they mild, moderate or severe?" ?    N/a ?6. PREGNANCY:  "Is there any chance that you are pregnant?" "When was your last menstrual period?" ?    No ? ?Protocols used: Medication Question Call-A-AH ? ?

## 2021-12-05 NOTE — Progress Notes (Signed)
Patient is here for his HFU. Patient said that he wanted to talk about having gout and his neuropathy issues.    ?

## 2021-12-06 ENCOUNTER — Other Ambulatory Visit: Payer: Self-pay | Admitting: *Deleted

## 2021-12-06 ENCOUNTER — Encounter: Payer: Self-pay | Admitting: Family Medicine

## 2021-12-06 MED ORDER — NEOMYCIN-POLYMYXIN-HC OP SUSP: 2.0000 [drp] | Freq: Four times a day (QID) | OPHTHALMIC | 0 refills | Status: DC

## 2021-12-06 NOTE — Progress Notes (Signed)
? ?Established Patient Office Visit ? ?Subjective   ? ?Patient ID: [name], male    DOB: 10/12/1970  Age: 51 y.o. MRN: [id number] ? ?CC:  ?Chief Complaint  ?Patient presents with  ? Follow-up  ?  HFU  ? ? ?HPI ?[name] presents with complaint of left eye redness and drainage. Patient denies known trauma or injury. Patient denies known contacts or exposures. Patient denies visual changes. Patient also complains of nerve pain and balance issues.  ? ? ?Outpatient Encounter Medications as of 12/05/2021  ?Medication Sig  ? chlordiazePOXIDE (LIBRIUM) 25 MG capsule '50mg'$  PO TID x 1D, then 25-'50mg'$  PO BID X 1D, then 25-'50mg'$  PO QD X 1D  ? gabapentin (NEURONTIN) 300 MG capsule Take 1 capsule (300 mg total) by mouth 3 (three) times daily.  ? lactulose (CHRONULAC) 10 GM/15ML solution Take 15 mLs (10 g total) by mouth 3 (three) times daily. (Patient taking differently: Take 30 g by mouth 3 (three) times daily.)  ? levETIRAcetam (KEPPRA) 500 MG tablet Take 1 tablet (500 mg total) by mouth 2 (two) times daily.  ? metoprolol tartrate (LOPRESSOR) 25 MG tablet Take 0.5 tablets (12.5 mg total) by mouth 2 (two) times daily.  ? MILK THISTLE PO Take 1 capsule by mouth 2 (two) times daily.  ? Multiple Vitamin (MULTIVITAMIN WITH MINERALS) TABS tablet Take 1 tablet by mouth daily.  ? NEOMYCIN-POLYMYXIN-HC, OPHTH, SUSP Apply 2 drops to eye in the morning, at noon, in the evening, and at bedtime.  ? ondansetron (ZOFRAN-ODT) 4 MG disintegrating tablet Take 1 tablet (4 mg total) by mouth every 8 (eight) hours as needed for nausea or vomiting.  ? pantoprazole (PROTONIX) 40 MG tablet Take 1 tablet (40 mg total) by mouth daily.  ? vitamin C (VITAMIN C) 250 MG tablet Take 1 tablet (250 mg total) by mouth 2 (two) times daily. (Patient taking differently: Take 250 mg by mouth daily.)  ? [DISCONTINUED] QUEtiapine (SEROQUEL) 200 MG tablet Take 1 tablet (200 mg total) by mouth at bedtime.  ? [DISCONTINUED] sertraline (ZOLOFT) 25 MG tablet  Take 1 tablet (25 mg total) by mouth daily.  ? [DISCONTINUED] spironolactone (ALDACTONE) 100 MG tablet Take 1 tablet (100 mg total) by mouth daily.  ? QUEtiapine (SEROQUEL) 200 MG tablet Take 1 tablet (200 mg total) by mouth at bedtime.  ? sertraline (ZOLOFT) 25 MG tablet Take 1 tablet (25 mg total) by mouth daily.  ? spironolactone (ALDACTONE) 100 MG tablet Take 1 tablet (100 mg total) by mouth daily.  ? thiamine 100 MG tablet Take 1 tablet (100 mg total) by mouth daily. (Patient not taking: Reported on 11/29/2021)  ? ?No facility-administered encounter medications on file as of 12/05/2021.  ? ? ?Past Medical History:  ?Diagnosis Date  ? Alcohol abuse   ? Anxiety   ? at age 8  ? Blood transfusion without reported diagnosis   ? Cirrhosis ([place])   ? Depression   ? at age 15  ? GERD (gastroesophageal reflux disease)   ? Hypertension   ? Psychosis ([place])   ? PTSD (post-traumatic stress disorder)   ? Schizoaffective disorder   ? Seizure disorder ([place])   ? related to etoh seizure  ? Symptomatic anemia   ? ? ?Past Surgical History:  ?Procedure Laterality Date  ? ESOPHAGOGASTRODUODENOSCOPY (EGD) WITH PROPOFOL N/A 05/05/2020  ? Procedure: ESOPHAGOGASTRODUODENOSCOPY (EGD) WITH PROPOFOL;  Surgeon: [name], MD;  Location: [facility];  Service: Endoscopy;  Laterality: N/A;  ? FOOT SURGERY    ?  right  ? HAND SURGERY    ? ? ?Family History  ?Problem Relation Age of Onset  ? Alcoholism Mother   ? CAD Father   ? Depression Brother   ? Colon polyps Neg Hx   ? Esophageal cancer Neg Hx   ? Pancreatic cancer Neg Hx   ? Stomach cancer Neg Hx   ? ? ?Social History  ? ?Socioeconomic History  ? Marital status: Divorced  ?  Spouse name: Not on file  ? Number of children: Not on file  ? Years of education: Not on file  ? Highest education level: Not on file  ?Occupational History  ? Not on file  ?Tobacco Use  ? Smoking status: Every Day  ?  Packs/day: 1.50  ?  Years: 30.00  ?  Pack years: 45.00  ?  Types: Cigarettes  ? Smokeless  tobacco: Never  ?Vaping Use  ? Vaping Use: Never used  ?Substance and Sexual Activity  ? Alcohol use: Yes  ?  Comment: "until I pass out"  ? Drug use: Yes  ?  Frequency: 1.0 times per week  ?  Types: Marijuana  ? Sexual activity: Yes  ?  Birth control/protection: Condom  ?Other Topics Concern  ? Not on file  ?Social History Narrative  ? Not on file  ? ?Social Determinants of Health  ? ?Financial Resource Strain: Not on file  ?Food Insecurity: Not on file  ?Transportation Needs: Not on file  ?Physical Activity: Not on file  ?Stress: Not on file  ?Social Connections: Not on file  ?Intimate Partner Violence: Not on file  ? ? ?Review of Systems  ?Eyes:  Positive for discharge and redness. Negative for blurred vision.  ?All other systems reviewed and are negative. ? ?  ? ? ?Objective   ? ?BP 124/87   Pulse 87   Temp 98.1 ?F (36.7 ?C) (Oral)   Resp 16   Wt 225 lb 12.8 oz (102.4 kg)   SpO2 95%   BMI 30.62 kg/m?  ? ?Physical Exam ?Vitals and nursing note reviewed.  ?Constitutional:   ?   General: He is not in acute distress. ?Eyes:  ?   General:     ?   Left eye: Discharge present. ?   Conjunctiva/sclera:  ?   Left eye: Left conjunctiva is injected.  ?Cardiovascular:  ?   Rate and Rhythm: Normal rate and regular rhythm.  ?Pulmonary:  ?   Effort: Pulmonary effort is normal.  ?   Breath sounds: Normal breath sounds.  ?Abdominal:  ?   Palpations: Abdomen is soft.  ?   Tenderness: There is no abdominal tenderness.  ?Skin: ?   Coloration: Pallor: r.  ?   Findings: Rash: r.  ?Neurological:  ?   General: No focal deficit present.  ?   Mental Status: He is alert and oriented to person, place, and time.  ?   Motor: No weakness.  ?   Gait: Gait normal.  ? ? ? ?  ? ?Assessment & Plan:  ? ?1. Acute conjunctivitis of left eye, unspecified acute conjunctivitis type ?Cortisporin eye drops prescribed. ? ?2. Neuropathy ?Referral to neuro for further eval/mgt ?- Ambulatory referral to Neurology ? ?3. Balance disorder ?Referral to neuro  for further eval/mgt ?- Ambulatory referral to Neurology ? ?4. Schizoaffective disorder, depressive type ([place]) ?Meds refilled.  ?- sertraline (ZOLOFT) 25 MG tablet; Take 1 tablet (25 mg total) by mouth daily.  Dispense: 90 tablet; Refill: 0 ? ?5. Primary  hypertension ?Appears stable with present management. Meds refilled.  ?- spironolactone (ALDACTONE) 100 MG tablet; Take 1 tablet (100 mg total) by mouth daily.  Dispense: 90 tablet; Refill: 1 ? ?No follow-ups on file.  ? ?Becky Sax, MD ? ? ?

## 2022-01-09 ENCOUNTER — Observation Stay (HOSPITAL_COMMUNITY)
Admission: EM | Admit: 2022-01-09 | Discharge: 2022-01-13 | Disposition: A | Discharge status: Home / Self Care | Payer: Medicare Other | Attending: Internal Medicine | Admitting: Internal Medicine

## 2022-01-09 ENCOUNTER — Other Ambulatory Visit: Payer: Self-pay

## 2022-01-09 DIAGNOSIS — Y905 Blood alcohol level of 100-119 mg/100 ml: Secondary | ICD-10-CM | POA: Diagnosis not present

## 2022-01-09 DIAGNOSIS — F10930 Alcohol use, unspecified with withdrawal, uncomplicated: Secondary | ICD-10-CM | POA: Diagnosis present

## 2022-01-09 DIAGNOSIS — R2689 Other abnormalities of gait and mobility: Secondary | ICD-10-CM | POA: Insufficient documentation

## 2022-01-09 DIAGNOSIS — Z79899 Other long term (current) drug therapy: Secondary | ICD-10-CM | POA: Diagnosis not present

## 2022-01-09 DIAGNOSIS — K7031 Alcoholic cirrhosis of liver with ascites: Secondary | ICD-10-CM | POA: Diagnosis present

## 2022-01-09 DIAGNOSIS — I10 Essential (primary) hypertension: Secondary | ICD-10-CM | POA: Diagnosis present

## 2022-01-09 DIAGNOSIS — F251 Schizoaffective disorder, depressive type: Secondary | ICD-10-CM | POA: Diagnosis present

## 2022-01-09 DIAGNOSIS — F10130 Alcohol abuse with withdrawal, uncomplicated: Secondary | ICD-10-CM | POA: Diagnosis not present

## 2022-01-09 DIAGNOSIS — E876 Hypokalemia: Secondary | ICD-10-CM | POA: Diagnosis not present

## 2022-01-09 DIAGNOSIS — F17210 Nicotine dependence, cigarettes, uncomplicated: Secondary | ICD-10-CM | POA: Diagnosis not present

## 2022-01-09 DIAGNOSIS — K219 Gastro-esophageal reflux disease without esophagitis: Secondary | ICD-10-CM | POA: Diagnosis present

## 2022-01-09 DIAGNOSIS — R112 Nausea with vomiting, unspecified: Secondary | ICD-10-CM

## 2022-01-09 DIAGNOSIS — R7989 Other specified abnormal findings of blood chemistry: Secondary | ICD-10-CM | POA: Diagnosis present

## 2022-01-09 DIAGNOSIS — M109 Gout, unspecified: Secondary | ICD-10-CM | POA: Diagnosis present

## 2022-01-09 NOTE — ED Triage Notes (Signed)
Pt bib [facility] from home c/o of tremors, nausea and 4/10 headache. Pt reported last drink was this afternoon. Hx alcohol dependence + cirrhosis. Pt reported not eating or drinking for two days due to nausea. One episode of emesis en route, no meds given. BP 138/90, HR 100, SPO2 96%, CBG 160

## 2022-01-10 DIAGNOSIS — K7031 Alcoholic cirrhosis of liver with ascites: Secondary | ICD-10-CM | POA: Diagnosis not present

## 2022-01-10 DIAGNOSIS — F10930 Alcohol use, unspecified with withdrawal, uncomplicated: Secondary | ICD-10-CM | POA: Diagnosis not present

## 2022-01-10 DIAGNOSIS — F251 Schizoaffective disorder, depressive type: Secondary | ICD-10-CM

## 2022-01-10 DIAGNOSIS — E876 Hypokalemia: Secondary | ICD-10-CM

## 2022-01-10 DIAGNOSIS — R7989 Other specified abnormal findings of blood chemistry: Secondary | ICD-10-CM

## 2022-01-10 DIAGNOSIS — F17210 Nicotine dependence, cigarettes, uncomplicated: Secondary | ICD-10-CM

## 2022-01-10 DIAGNOSIS — M109 Gout, unspecified: Secondary | ICD-10-CM | POA: Diagnosis present

## 2022-01-10 DIAGNOSIS — K219 Gastro-esophageal reflux disease without esophagitis: Secondary | ICD-10-CM

## 2022-01-10 DIAGNOSIS — R112 Nausea with vomiting, unspecified: Secondary | ICD-10-CM

## 2022-01-10 DIAGNOSIS — I10 Essential (primary) hypertension: Secondary | ICD-10-CM

## 2022-01-10 LAB — COMPREHENSIVE METABOLIC PANEL
ALT: 82 U/L — ABNORMAL HIGH (ref 0–44)
AST: 74 U/L — ABNORMAL HIGH (ref 15–41)
Albumin: 4.7 g/dL (ref 3.5–5.0)
Alkaline Phosphatase: 127 U/L — ABNORMAL HIGH (ref 38–126)
Anion gap: 16 — ABNORMAL HIGH (ref 5–15)
BUN: <5 mg/dL — ABNORMAL LOW (ref 6–20)
CO2: 29 mmol/L (ref 22–32)
Calcium: 9.6 mg/dL (ref 8.9–10.3)
Chloride: 96 mmol/L — ABNORMAL LOW (ref 98–111)
Creatinine, Ser: 0.85 mg/dL (ref 0.61–1.24)
GFR, Estimated: >60 mL/min (ref >60)
Glucose, Bld: 136 mg/dL — ABNORMAL HIGH (ref 70–99)
Potassium: 2.9 mmol/L — ABNORMAL LOW (ref 3.5–5.1)
Sodium: 141 mmol/L (ref 135–145)
Total Bilirubin: 0.7 mg/dL (ref 0.3–1.2)
Total Protein: 8.2 g/dL — ABNORMAL HIGH (ref 6.5–8.1)

## 2022-01-10 LAB — RAPID URINE DRUG SCREEN, HOSP PERFORMED
Amphetamines: NOT DETECTED
Barbiturates: NOT DETECTED
Benzodiazepines: POSITIVE — AB
Cocaine: NOT DETECTED
Opiates: NOT DETECTED
Tetrahydrocannabinol: POSITIVE — AB

## 2022-01-10 LAB — URINALYSIS, ROUTINE W REFLEX MICROSCOPIC
Bilirubin Urine: NEGATIVE
Glucose, UA: NEGATIVE mg/dL
Hgb urine dipstick: NEGATIVE
Ketones, ur: 5 mg/dL — AB
Leukocytes,Ua: NEGATIVE
Nitrite: NEGATIVE
Protein, ur: 100 mg/dL — AB
Specific Gravity, Urine: 1.017 (ref 1.005–1.030)
pH: 6 (ref 5.0–8.0)

## 2022-01-10 LAB — CBC WITH DIFFERENTIAL/PLATELET
Abs Immature Granulocytes: 0.07 10*3/uL (ref 0.00–0.07)
Basophils Absolute: 0.1 10*3/uL (ref 0.0–0.1)
Basophils Relative: 1 %
Eosinophils Absolute: 0 10*3/uL (ref 0.0–0.5)
Eosinophils Relative: 0 %
HCT: 45.1 % (ref 39.0–52.0)
Hemoglobin: 15.6 g/dL (ref 13.0–17.0)
Immature Granulocytes: 1 %
Lymphocytes Relative: 10 %
Lymphs Abs: 1.3 10*3/uL (ref 0.7–4.0)
MCH: 33.5 pg (ref 26.0–34.0)
MCHC: 34.6 g/dL (ref 30.0–36.0)
MCV: 97 fL (ref 80.0–100.0)
Monocytes Absolute: 1.2 10*3/uL — ABNORMAL HIGH (ref 0.1–1.0)
Monocytes Relative: 9 %
Neutro Abs: 10.1 10*3/uL — ABNORMAL HIGH (ref 1.7–7.7)
Neutrophils Relative %: 79 %
Platelets: 328 10*3/uL (ref 150–400)
RBC: 4.65 MIL/uL (ref 4.22–5.81)
RDW: 16 % — ABNORMAL HIGH (ref 11.5–15.5)
WBC: 12.8 10*3/uL — ABNORMAL HIGH (ref 4.0–10.5)
nRBC: 0 % (ref 0.0–0.2)

## 2022-01-10 LAB — BASIC METABOLIC PANEL
Anion gap: 12 (ref 5–15)
BUN: 5 mg/dL — ABNORMAL LOW (ref 6–20)
CO2: 29 mmol/L (ref 22–32)
Calcium: 9.5 mg/dL (ref 8.9–10.3)
Chloride: 99 mmol/L (ref 98–111)
Creatinine, Ser: 0.95 mg/dL (ref 0.61–1.24)
GFR, Estimated: >60 mL/min (ref >60)
Glucose, Bld: 129 mg/dL — ABNORMAL HIGH (ref 70–99)
Potassium: 3.7 mmol/L (ref 3.5–5.1)
Sodium: 140 mmol/L (ref 135–145)

## 2022-01-10 LAB — MAGNESIUM
Magnesium: 1.5 mg/dL — ABNORMAL LOW (ref 1.7–2.4)
Magnesium: 2.1 mg/dL (ref 1.7–2.4)

## 2022-01-10 LAB — ETHANOL: Alcohol, Ethyl (B): 114 mg/dL — ABNORMAL HIGH (ref <10)

## 2022-01-10 MED ORDER — ONDANSETRON 4 MG PO TBDP: 4.0000 mg | ORAL_TABLET | Freq: Four times a day (QID) | ORAL | Status: AC | PRN

## 2022-01-10 MED ORDER — SERTRALINE HCL 25 MG PO TABS
25.0000 mg | ORAL_TABLET | Freq: Every day | ORAL | Status: DC
  Administered 2022-01-10 – 2022-01-13 (×4): 25 mg via ORAL
  Filled 2022-01-10 (×4): qty 1

## 2022-01-10 MED ORDER — SPIRONOLACTONE 25 MG PO TABS
100.0000 mg | ORAL_TABLET | Freq: Every day | ORAL | Status: DC
  Administered 2022-01-10 – 2022-01-13 (×4): 100 mg via ORAL
  Filled 2022-01-10 (×3): qty 4
  Filled 2022-01-10: qty 1

## 2022-01-10 MED ORDER — ONDANSETRON 4 MG PO TBDP
8.0000 mg | ORAL_TABLET | Freq: Once | ORAL | Status: AC
  Administered 2022-01-10: 8 mg via ORAL
  Filled 2022-01-10: qty 2

## 2022-01-10 MED ORDER — ONDANSETRON HCL 4 MG PO TABS: 4.0000 mg | ORAL_TABLET | Freq: Four times a day (QID) | ORAL | Status: DC | PRN

## 2022-01-10 MED ORDER — ACETAMINOPHEN 325 MG PO TABS: 650.0000 mg | ORAL_TABLET | Freq: Four times a day (QID) | ORAL | Status: DC | PRN

## 2022-01-10 MED ORDER — POTASSIUM CHLORIDE 10 MEQ/100ML IV SOLN
10.0000 meq | Freq: Once | INTRAVENOUS | Status: AC
Start: 2022-01-10 — End: 2022-01-10
  Administered 2022-01-10: 10 meq via INTRAVENOUS
  Filled 2022-01-10: qty 100

## 2022-01-10 MED ORDER — CHLORDIAZEPOXIDE HCL 5 MG PO CAPS
25.0000 mg | ORAL_CAPSULE | Freq: Every day | ORAL | Status: AC
  Administered 2022-01-13: 25 mg via ORAL
  Filled 2022-01-10: qty 5

## 2022-01-10 MED ORDER — CHLORDIAZEPOXIDE HCL 5 MG PO CAPS
25.0000 mg | ORAL_CAPSULE | Freq: Four times a day (QID) | ORAL | Status: AC
  Administered 2022-01-10 (×4): 25 mg via ORAL
  Filled 2022-01-10 (×3): qty 5
  Filled 2022-01-10: qty 1

## 2022-01-10 MED ORDER — MAGNESIUM SULFATE 2 GM/50ML IV SOLN
2.0000 g | Freq: Once | INTRAVENOUS | Status: AC
Start: 2022-01-10 — End: 2022-01-10
  Administered 2022-01-10: 2 g via INTRAVENOUS
  Filled 2022-01-10: qty 50

## 2022-01-10 MED ORDER — LIDOCAINE VISCOUS HCL 2 % MT SOLN
15.0000 mL | Freq: Once | OROMUCOSAL | Status: AC
  Administered 2022-01-10: 15 mL via ORAL
  Filled 2022-01-10: qty 15

## 2022-01-10 MED ORDER — LORAZEPAM 2 MG/ML IJ SOLN
2.0000 mg | Freq: Once | INTRAMUSCULAR | Status: AC
  Administered 2022-01-10: 2 mg via INTRAMUSCULAR
  Filled 2022-01-10: qty 1

## 2022-01-10 MED ORDER — LACTULOSE 10 GM/15ML PO SOLN
30.0000 g | Freq: Three times a day (TID) | ORAL | Status: DC
  Administered 2022-01-10 – 2022-01-13 (×11): 30 g via ORAL
  Filled 2022-01-10 (×10): qty 45
  Filled 2022-01-10: qty 60

## 2022-01-10 MED ORDER — THIAMINE HCL 100 MG/ML IJ SOLN: 100.0000 mg | Freq: Once | INTRAMUSCULAR | Status: DC

## 2022-01-10 MED ORDER — METHYLPREDNISOLONE SODIUM SUCC 125 MG IJ SOLR
60.0000 mg | Freq: Once | INTRAMUSCULAR | Status: AC
  Administered 2022-01-10: 60 mg via INTRAVENOUS
  Filled 2022-01-10: qty 2

## 2022-01-10 MED ORDER — LORAZEPAM 1 MG PO TABS
0.0000 mg | ORAL_TABLET | Freq: Four times a day (QID) | ORAL | Status: AC
  Administered 2022-01-10 – 2022-01-11 (×5): 2 mg via ORAL
  Filled 2022-01-10 (×5): qty 2

## 2022-01-10 MED ORDER — POTASSIUM CHLORIDE CRYS ER 20 MEQ PO TBCR
40.0000 meq | EXTENDED_RELEASE_TABLET | Freq: Once | ORAL | Status: AC
  Administered 2022-01-10: 40 meq via ORAL
  Filled 2022-01-10: qty 2

## 2022-01-10 MED ORDER — CHLORDIAZEPOXIDE HCL 5 MG PO CAPS
25.0000 mg | ORAL_CAPSULE | Freq: Three times a day (TID) | ORAL | Status: AC
  Administered 2022-01-11 (×3): 25 mg via ORAL
  Filled 2022-01-10 (×3): qty 5

## 2022-01-10 MED ORDER — POTASSIUM CHLORIDE CRYS ER 20 MEQ PO TBCR
40.0000 meq | EXTENDED_RELEASE_TABLET | ORAL | Status: AC
  Administered 2022-01-10 (×2): 40 meq via ORAL
  Filled 2022-01-10 (×2): qty 2

## 2022-01-10 MED ORDER — HYDROXYZINE HCL 25 MG PO TABS
25.0000 mg | ORAL_TABLET | Freq: Four times a day (QID) | ORAL | Status: AC | PRN
  Administered 2022-01-12 – 2022-01-13 (×3): 25 mg via ORAL
  Filled 2022-01-10 (×3): qty 1

## 2022-01-10 MED ORDER — CHLORDIAZEPOXIDE HCL 5 MG PO CAPS
25.0000 mg | ORAL_CAPSULE | ORAL | Status: AC
  Administered 2022-01-12 (×2): 25 mg via ORAL
  Filled 2022-01-10 (×2): qty 5

## 2022-01-10 MED ORDER — LACTATED RINGERS IV BOLUS
1000.0000 mL | Freq: Once | INTRAVENOUS | Status: AC
  Administered 2022-01-10: 1000 mL via INTRAVENOUS

## 2022-01-10 MED ORDER — NICOTINE 21 MG/24HR TD PT24
21.0000 mg | MEDICATED_PATCH | Freq: Every day | TRANSDERMAL | Status: DC
  Administered 2022-01-10 – 2022-01-13 (×4): 21 mg via TRANSDERMAL
  Filled 2022-01-10 (×4): qty 1

## 2022-01-10 MED ORDER — THIAMINE HCL 100 MG PO TABS
100.0000 mg | ORAL_TABLET | Freq: Every day | ORAL | Status: DC
  Administered 2022-01-11 – 2022-01-13 (×3): 100 mg via ORAL
  Filled 2022-01-10 (×3): qty 1

## 2022-01-10 MED ORDER — LORAZEPAM 2 MG/ML IJ SOLN
0.0000 mg | Freq: Two times a day (BID) | INTRAMUSCULAR | Status: DC
  Administered 2022-01-12: 4 mg via INTRAVENOUS
  Filled 2022-01-10: qty 2

## 2022-01-10 MED ORDER — CHLORDIAZEPOXIDE HCL 5 MG PO CAPS
25.0000 mg | ORAL_CAPSULE | Freq: Four times a day (QID) | ORAL | Status: AC | PRN
  Administered 2022-01-12: 25 mg via ORAL
  Filled 2022-01-10: qty 5

## 2022-01-10 MED ORDER — PANTOPRAZOLE SODIUM 40 MG PO TBEC
40.0000 mg | DELAYED_RELEASE_TABLET | Freq: Every day | ORAL | Status: DC
Start: 2022-01-10 — End: 2022-01-10

## 2022-01-10 MED ORDER — ONDANSETRON HCL 4 MG/2ML IJ SOLN
4.0000 mg | Freq: Four times a day (QID) | INTRAMUSCULAR | Status: DC | PRN
Start: 2022-01-10 — End: 2022-01-13
  Administered 2022-01-10: 4 mg via INTRAVENOUS
  Filled 2022-01-10: qty 2

## 2022-01-10 MED ORDER — PANTOPRAZOLE SODIUM 40 MG PO TBEC
40.0000 mg | DELAYED_RELEASE_TABLET | Freq: Every day | ORAL | Status: DC
  Administered 2022-01-10: 40 mg via ORAL
  Filled 2022-01-10: qty 1

## 2022-01-10 MED ORDER — PREDNISONE 20 MG PO TABS
40.0000 mg | ORAL_TABLET | Freq: Every day | ORAL | Status: DC
  Administered 2022-01-11 – 2022-01-13 (×3): 40 mg via ORAL
  Filled 2022-01-10 (×3): qty 2

## 2022-01-10 MED ORDER — GABAPENTIN 300 MG PO CAPS
300.0000 mg | ORAL_CAPSULE | Freq: Three times a day (TID) | ORAL | Status: DC
  Administered 2022-01-10 – 2022-01-13 (×11): 300 mg via ORAL
  Filled 2022-01-10 (×11): qty 1

## 2022-01-10 MED ORDER — LORAZEPAM 1 MG PO TABS
0.0000 mg | ORAL_TABLET | Freq: Two times a day (BID) | ORAL | Status: DC
  Administered 2022-01-12: 2 mg via ORAL
  Administered 2022-01-13: 4 mg via ORAL
  Filled 2022-01-10: qty 2
  Filled 2022-01-10: qty 4

## 2022-01-10 MED ORDER — LOPERAMIDE HCL 2 MG PO CAPS: 2.0000 mg | ORAL_CAPSULE | ORAL | Status: AC | PRN

## 2022-01-10 MED ORDER — ENOXAPARIN SODIUM 40 MG/0.4ML IJ SOSY
40.0000 mg | PREFILLED_SYRINGE | INTRAMUSCULAR | Status: DC
  Administered 2022-01-10 – 2022-01-12 (×3): 40 mg via SUBCUTANEOUS
  Filled 2022-01-10 (×3): qty 0.4

## 2022-01-10 MED ORDER — ACETAMINOPHEN 650 MG RE SUPP: 650.0000 mg | Freq: Four times a day (QID) | RECTAL | Status: DC | PRN

## 2022-01-10 MED ORDER — ALUM & MAG HYDROXIDE-SIMETH 200-200-20 MG/5ML PO SUSP
30.0000 mL | Freq: Once | ORAL | Status: AC
  Administered 2022-01-10: 30 mL via ORAL
  Filled 2022-01-10: qty 30

## 2022-01-10 MED ORDER — HEPARIN SODIUM (PORCINE) 5000 UNIT/ML IJ SOLN
5000.0000 [IU] | Freq: Three times a day (TID) | INTRAMUSCULAR | Status: DC
  Administered 2022-01-10 (×2): 5000 [IU] via SUBCUTANEOUS
  Filled 2022-01-10 (×2): qty 1

## 2022-01-10 MED ORDER — LACTATED RINGERS IV BOLUS
1000.0000 mL | Freq: Once | INTRAVENOUS | Status: AC
Start: 2022-01-10 — End: 2022-01-10
  Administered 2022-01-10: 1000 mL via INTRAVENOUS

## 2022-01-10 MED ORDER — THIAMINE HCL 100 MG PO TABS
100.0000 mg | ORAL_TABLET | Freq: Every day | ORAL | Status: DC
  Administered 2022-01-10: 100 mg via ORAL
  Filled 2022-01-10: qty 1

## 2022-01-10 MED ORDER — QUETIAPINE FUMARATE 100 MG PO TABS
200.0000 mg | ORAL_TABLET | Freq: Every day | ORAL | Status: DC
  Administered 2022-01-10 – 2022-01-12 (×3): 200 mg via ORAL
  Filled 2022-01-10: qty 2
  Filled 2022-01-10: qty 1
  Filled 2022-01-10 (×2): qty 2

## 2022-01-10 MED ORDER — LORAZEPAM 2 MG/ML IJ SOLN: 0.0000 mg | Freq: Four times a day (QID) | INTRAMUSCULAR | Status: AC

## 2022-01-10 MED ORDER — THIAMINE HCL 100 MG/ML IJ SOLN: 100.0000 mg | Freq: Every day | INTRAMUSCULAR | Status: DC

## 2022-01-10 MED ORDER — PANTOPRAZOLE SODIUM 40 MG PO TBEC
40.0000 mg | DELAYED_RELEASE_TABLET | Freq: Two times a day (BID) | ORAL | Status: DC
Start: 2022-01-10 — End: 2022-01-13
  Administered 2022-01-10 – 2022-01-13 (×7): 40 mg via ORAL
  Filled 2022-01-10 (×8): qty 1

## 2022-01-10 MED ORDER — LORAZEPAM 2 MG/ML IJ SOLN
1.0000 mg | INTRAMUSCULAR | Status: DC | PRN
  Administered 2022-01-13: 1 mg via INTRAVENOUS
  Filled 2022-01-10: qty 1

## 2022-01-10 MED ORDER — ADULT MULTIVITAMIN W/MINERALS CH
1.0000 | ORAL_TABLET | Freq: Every day | ORAL | Status: DC
  Administered 2022-01-10 – 2022-01-13 (×4): 1 via ORAL
  Filled 2022-01-10 (×4): qty 1

## 2022-01-10 NOTE — Assessment & Plan Note (Addendum)
Acute.  Secondary to his alcohol use.   -Repeat LFTs in the morning.

## 2022-01-10 NOTE — Assessment & Plan Note (Addendum)
Chronic.  Patient continues to drink alcohol.  Patient has not decided if he wants to participate in long-term alcohol rehab.  He is at high risk for liver failure due to continued alcohol abuse.  Continue with lactulose, Aldactone 100 mg daily.

## 2022-01-10 NOTE — Assessment & Plan Note (Signed)
-   Patient with complaints of acute gouty flare to RN. -60 mg x 1 and then start prednisone 40 mg daily for the next 4 to 5 days starting tomorrow 01/11/2022.

## 2022-01-10 NOTE — ED Notes (Signed)
Pt spo2 88% while resting. 2L [place] applied

## 2022-01-10 NOTE — Assessment & Plan Note (Addendum)
Chronic.   -Patient with minimal wheezing on examination. -Patient placed on DuoNebs as needed.   -Tobacco cessation.   -Patient placed on nicotine patch.

## 2022-01-10 NOTE — Assessment & Plan Note (Addendum)
-  Patient was maintained on a PPI.

## 2022-01-10 NOTE — Assessment & Plan Note (Addendum)
Acute.  -Likely secondary to GI losses.  -Repleted.  -Potassium of 4.2.

## 2022-01-10 NOTE — Progress Notes (Signed)
PROGRESS NOTE    [name]  ZCH:[id number] DOB: August 15, 1971 DOA: 01/09/2022 PCP: [name], MD    Chief Complaint  Patient presents with   Withdrawal    Brief Narrative:  Patient 51 year old gentleman history of alcoholic cirrhosis, hypertension, polysubstance abuse, schizoaffective disorder presented to the ED with complaints of nausea vomiting and asking for help with alcohol cessation.  Last drink was on the day of admission.  Patient admitted, placed on the Ativan withdrawal protocol.  TOC consulted    Assessment & Plan:  Principal Problem:   Alcohol withdrawal syndrome without complication (HCC) Active Problems:   Hypokalemia   Abnormal LFTs   Tobacco dependence due to cigarettes   Schizoaffective disorder, depressive type (HCC)   GERD (gastroesophageal reflux disease)   HTN (hypertension)   Alcoholic cirrhosis of liver with ascites (HCC)   Acute gout    Assessment and Plan: * Alcohol withdrawal syndrome without complication ([place]) Admit to observation telemetry bed. Alcohol withdrawal syndrome without complication (HCC) is a Acute illness/condition that poses a threat to life or bodily function.  CIWA protocol. Prn IV ativan 1 mg for seizures.  TOC consult to help with drug rehab placement.   Dr. Bridgett Larsson discussed with the patient that if he does not decide to proceed with intensive alcohol treatment program, then admission to the hospital really serves no purpose as he will likely relapse and start drinking again.  He needs to make a commitment to himself that he is going to stop drinking.  Continue with Neurontin 300 mg 3 times daily. -Place on the Librium detox protocol. -Continue IV Ativan as needed.   Abnormal LFTs Acute.  Secondary to his alcohol use.   -Repeat LFTs in the morning.  Hypokalemia Acute.  -Likely secondary to GI losses.  -Repleted.   Alcoholic cirrhosis of liver with ascites (HCC) Chronic.  Patient continues to drink alcohol.  Patient  has not decided if he wants to participate in long-term alcohol rehab.  He is at high risk for liver failure due to continued alcohol abuse.  Continue with lactulose, Aldactone 100 mg daily.  HTN (hypertension) Stable.   -Continue Aldactone 100 mg daily.  Patient states he is no longer taking metoprolol.  GERD (gastroesophageal reflux disease) - Continue PPI.  Schizoaffective disorder, depressive type (HCC) Chronic.  Stable.  - Continue home regimen Seroquel 200 mg, Zoloft 25 mg.  Tobacco dependence due to cigarettes Chronic.   Tobacco cessation. Continue nicotine patch.   Acute gout - Patient with complaints of acute gouty flare to RN. -60 mg x 1 and then start prednisone 40 mg daily for the next 4 to 5 days starting tomorrow 01/11/2022.         DVT prophylaxis: Heparin>>> Lovenox. Code Status: Full Family Communication: Updated patient.  No family at bedside. Disposition: Home when clinically improved with no further symptoms of withdrawal and completion of detox protocol.  Status is: Observation The patient remains OBS appropriate and will d/c before 2 midnights.   Consultants:  None  Procedures:  None  Antimicrobials:  None   Subjective: C/o nausea, heartburn. No SOB. No emesis.  Objective: Vitals:   01/10/22 1032 01/10/22 1057 01/10/22 1229 01/10/22 1400  BP: 134/77 131/84 131/84 (!) 141/93  Pulse: 75 84 80 86  Resp: 20   20  Temp:    98.4 F (36.9 C)  TempSrc:    Oral  SpO2: 92%   95%  Weight:      Height:  Intake/Output Summary (Last 24 hours) at 01/10/2022 1719 Last data filed at 01/10/2022 1258 Gross per 24 hour  Intake 2186.72 ml  Output 1 ml  Net 2185.72 ml   Filed Weights   01/09/22 2357  Weight: 99.8 kg    Examination:  General exam: Appears calm and comfortable.  Tremors noted. Respiratory system: Clear to auscultation.  No wheezes, no crackles, no rhonchi.  Respiratory effort normal. Cardiovascular system: S1 & S2 heard,  RRR. No JVD, murmurs, rubs, gallops or clicks. No pedal edema. Gastrointestinal system: Abdomen is nondistended, soft and nontender. No organomegaly or masses felt. Normal bowel sounds heard. Central nervous system: Alert and oriented. No focal neurological deficits. Extremities: Symmetric 5 x 5 power. Skin: No rashes, lesions or ulcers Psychiatry: Judgement and insight appear normal. Mood & affect appropriate.     Data Reviewed:   CBC: Recent Labs  Lab 01/10/22 0104  WBC 12.8*  NEUTROABS 10.1*  HGB 15.6  HCT 45.1  MCV 97.0  PLT 517    Basic Metabolic Panel: Recent Labs  Lab 01/10/22 0104 01/10/22 1021  NA 141 140  K 2.9* 3.7  CL 96* 99  CO2 29 29  GLUCOSE 136* 129*  BUN <5* 5*  CREATININE 0.85 0.95  CALCIUM 9.6 9.5  MG 1.5* 2.1    GFR: Estimated Creatinine Clearance: 115.1 mL/min (by C-G formula based on SCr of 0.95 mg/dL).  Liver Function Tests: Recent Labs  Lab 01/10/22 0104  AST 74*  ALT 82*  ALKPHOS 127*  BILITOT 0.7  PROT 8.2*  ALBUMIN 4.7    CBG: No results for input(s): GLUCAP in the last 168 hours.   No results found for this or any previous visit (from the past 240 hour(s)).       Radiology Studies: No results found.      Scheduled Meds:  chlordiazePOXIDE  25 mg Oral QID   Followed by   Derrill Memo ON 01/11/2022] chlordiazePOXIDE  25 mg Oral TID   Followed by   Derrill Memo ON 01/12/2022] chlordiazePOXIDE  25 mg Oral BH-qamhs   Followed by   Derrill Memo ON 01/13/2022] chlordiazePOXIDE  25 mg Oral Daily   enoxaparin (LOVENOX) injection  40 mg Subcutaneous Q24H   gabapentin  300 mg Oral TID   lactulose  30 g Oral TID   LORazepam  0-4 mg Intravenous Q6H   Or   LORazepam  0-4 mg Oral Q6H   [START ON 01/12/2022] LORazepam  0-4 mg Intravenous Q12H   Or   [START ON 01/12/2022] LORazepam  0-4 mg Oral Q12H   methylPREDNISolone (SOLU-MEDROL) injection  60 mg Intravenous Once   multivitamin with minerals  1 tablet Oral Daily   nicotine  21 mg  Transdermal Daily   pantoprazole  40 mg Oral BID AC   [START ON 01/11/2022] predniSONE  40 mg Oral QAC breakfast   QUEtiapine  200 mg Oral QHS   sertraline  25 mg Oral Daily   spironolactone  100 mg Oral Daily   [START ON 01/11/2022] thiamine  100 mg Oral Daily   Continuous Infusions:   LOS: 0 days    Time spent: 35 minutes  No charge    Irine Seal, MD [facility]   To contact the attending provider between 7A-7P or the covering provider during after hours 7P-7A, please log into the web site [email or web address] and access using universal [place] password for that web site. If you do not have the password, please call the hospital operator.  01/10/2022, 5:19  PM

## 2022-01-10 NOTE — Assessment & Plan Note (Addendum)
Stable.   -Patient maintained on home regimen Aldactone 100 mg daily.  -It is noted per admitting physician that patient stated no longer on metoprolol.

## 2022-01-10 NOTE — H&P (Addendum)
History and Physical    [name] ZOX:[id number] DOB: 1970/12/05 DOA: 01/09/2022  DOS: the patient was seen and examined on 01/09/2022  PCP: [name], MD   Patient coming from: Home  I have personally briefly reviewed patient's old medical records in [name]  Chief complaint: Nausea, vomiting, wants to stop drinking History of present illness: 51 year old male history of alcoholic cirrhosis, hypertension, polysubstance abuse, schizoaffective disorder, presents to the ER today with complaints of nausea, vomiting and want to stop drinking.  Last drink was today.  He normally drinks 4 Loco's.  Usually consumes about 5 cans a day.  Also been smoking marijuana.  He has a history of alcoholic cirrhosis.  He continues to abuse alcohol.  Has been having vomiting for 2 days.  On arrival temp 97.7 heart rate 71 blood pressure 136/96 satting 98% on room air  Labs: UDS positive for benzodiazepines and cannabis Sodium 141, potassium 2.9, BUN less than 5, creatinine 0.8  AST of 74, ALT of 82, alk phos of 127, total bili of 0.7  Magnesium 1.5  White count 12.8, hemoglobin 15.6, platelets 328  Ethanol level elevated at 114.  Patient started on CIWA protocol.  [facility] hospital[facility] contacted for admission.   ED Course: Noted to be hypokalemic and hypomagnesemic.  Repletion started.  CIWA protocol started.  Review of Systems:  Review of Systems  Constitutional:  Negative for chills and fever.  HENT: Negative.    Eyes: Negative.   Respiratory: Negative.    Cardiovascular: Negative.   Gastrointestinal:  Positive for nausea and vomiting.  Skin: Negative.   Neurological: Negative.   Endo/Heme/Allergies: Negative.   Psychiatric/Behavioral:  Positive for depression and substance abuse.   All other systems reviewed and are negative.  Past Medical History:  Diagnosis Date   Alcohol abuse    Anxiety    at age 60   Blood transfusion without reported diagnosis    Cirrhosis  ([place])    Depression    at age 23   GERD (gastroesophageal reflux disease)    Hypertension    Psychosis ([place])    PTSD (post-traumatic stress disorder)    Schizoaffective disorder    Seizure disorder ([place])    related to etoh seizure   Symptomatic anemia     Past Surgical History:  Procedure Laterality Date   ESOPHAGOGASTRODUODENOSCOPY (EGD) WITH PROPOFOL N/A 05/05/2020   Procedure: ESOPHAGOGASTRODUODENOSCOPY (EGD) WITH PROPOFOL;  Surgeon: [name], MD;  Location: [facility];  Service: Endoscopy;  Laterality: N/A;   FOOT SURGERY     right   HAND SURGERY       reports that he has been smoking cigarettes. He has a 45.00 pack-year smoking history. He has never used smokeless tobacco. He reports current alcohol use. He reports current drug use. Frequency: 1.00 time per week. Drug: Marijuana.  Allergies  Allergen Reactions   Bupropion Other (See Comments)    Caused seizures Caused seizures Feel as though I will have a seizure when taking this med    Family History  Problem Relation Age of Onset   Alcoholism Mother    CAD Father    Depression Brother    Colon polyps Neg Hx    Esophageal cancer Neg Hx    Pancreatic cancer Neg Hx    Stomach cancer Neg Hx     Prior to Admission medications   Medication Sig Start Date End Date Taking? Authorizing Provider  gabapentin (NEURONTIN) 300 MG capsule Take 1 capsule (300 mg total)  by mouth 3 (three) times daily. 05/15/21  Yes [name], MD  ibuprofen (ADVIL) 200 MG tablet Take 600 mg by mouth every 6 (six) hours as needed for headache or moderate pain.   Yes [provider]  lactulose (CHRONULAC) 10 GM/15ML solution Take 15 mLs (10 g total) by mouth 3 (three) times daily. Patient taking differently: Take 30 g by mouth 3 (three) times daily. 09/23/21  Yes [name], MD  levETIRAcetam (KEPPRA) 500 MG tablet Take 1 tablet (500 mg total) by mouth 2 (two) times daily. 09/23/21  Yes [name], MD  metoprolol  tartrate (LOPRESSOR) 25 MG tablet Take 0.5 tablets (12.5 mg total) by mouth 2 (two) times daily. 09/23/21  Yes [name], [name], MD  MILK THISTLE PO Take 1 capsule by mouth 2 (two) times daily.   Yes [provider]  ondansetron (ZOFRAN-ODT) 4 MG disintegrating tablet Take 1 tablet (4 mg total) by mouth every 8 (eight) hours as needed for nausea or vomiting. 11/29/21  Yes Long, [name], MD  pantoprazole (PROTONIX) 40 MG tablet Take 1 tablet (40 mg total) by mouth daily. 11/29/21  Yes Long, [name], MD  QUEtiapine (SEROQUEL) 200 MG tablet Take 1 tablet (200 mg total) by mouth at bedtime. 12/05/21  Yes [name], MD  sertraline (ZOLOFT) 25 MG tablet Take 1 tablet (25 mg total) by mouth daily. 12/05/21  Yes [name], MD  spironolactone (ALDACTONE) 100 MG tablet Take 1 tablet (100 mg total) by mouth daily. 12/05/21  Yes [name], MD  vitamin C (VITAMIN C) 250 MG tablet Take 1 tablet (250 mg total) by mouth 2 (two) times daily. Patient taking differently: Take 250 mg by mouth daily. 08/06/20  Yes [name], MD  chlordiazePOXIDE (LIBRIUM) 25 MG capsule 16m PO TID x 1D, then 25-573mPO BID X 1D, then 25-5054mO QD X 1D Patient not taking: Reported on 01/10/2022 11/29/21   Long, [name]  Multiple Vitamin (MULTIVITAMIN WITH MINERALS) TABS tablet Take 1 tablet by mouth daily. Patient not taking: Reported on 01/10/2022 05/08/20   [place]ri[place]arie PoagO  NEOMYCIN-POLYMYXIN-HC, OPHTH, SUSP Apply 2 drops to eye in the morning, at noon, in the evening, and at bedtime. Patient not taking: Reported on 01/10/2022 12/06/21   [name]  thiamine 100 MG tablet Take 1 tablet (100 mg total) by mouth daily. Patient not taking: Reported on 11/29/2021 09/24/21   [name]    Physical Exam: Vitals:   01/10/22 0426 01/10/22 0427 01/10/22 0428 01/10/22 0521  BP:    134/85  Pulse: 85 75 78 73  Resp: (!) 21 17 17  (!) 21  Temp:      TempSrc:      SpO2: 94% 94% 91% 96%  Weight:       Height:        Physical Exam Vitals and nursing note reviewed.  Constitutional:      General: He is not in acute distress.    Appearance: He is obese. He is not ill-appearing, toxic-appearing or diaphoretic.  HENT:     Head: Normocephalic and atraumatic.     Nose: Nose normal.  Cardiovascular:     Rate and Rhythm: Normal rate and regular rhythm.     Pulses: Normal pulses.  Pulmonary:     Effort: Pulmonary effort is normal. No respiratory distress.     Breath sounds: Normal breath sounds. No wheezing or rales.  Abdominal:     General:  Bowel sounds are normal. There is no distension.     Palpations: Abdomen is soft.     Tenderness: There is no abdominal tenderness. There is no guarding.  Musculoskeletal:     Right lower leg: No edema.     Left lower leg: No edema.  Skin:    General: Skin is warm and dry.     Capillary Refill: Capillary refill takes less than 2 seconds.  Neurological:     Mental Status: He is alert and oriented to person, place, and time.     Labs on Admission: I have personally reviewed following labs and imaging studies  CBC: Recent Labs  Lab 01/10/22 0104  WBC 12.8*  NEUTROABS 10.1*  HGB 15.6  HCT 45.1  MCV 97.0  PLT 185   Basic Metabolic Panel: Recent Labs  Lab 01/10/22 0104  NA 141  K 2.9*  CL 96*  CO2 29  GLUCOSE 136*  BUN <5*  CREATININE 0.85  CALCIUM 9.6  MG 1.5*   GFR: Estimated Creatinine Clearance: 128.6 mL/min (by C-G formula based on SCr of 0.85 mg/dL). Liver Function Tests: Recent Labs  Lab 01/10/22 0104  AST 74*  ALT 82*  ALKPHOS 127*  BILITOT 0.7  PROT 8.2*  ALBUMIN 4.7   No results for input(s): LIPASE, AMYLASE in the last 168 hours. No results for input(s): AMMONIA in the last 168 hours. Coagulation Profile: No results for input(s): INR, PROTIME in the last 168 hours. Cardiac Enzymes: No results for input(s): CKTOTAL, CKMB, CKMBINDEX, TROPONINI, TROPONINIHS in the last 168 hours. BNP (last 3 results) No  results for input(s): PROBNP in the last 8760 hours. HbA1C: No results for input(s): HGBA1C in the last 72 hours. CBG: No results for input(s): GLUCAP in the last 168 hours. Lipid Profile: No results for input(s): CHOL, HDL, LDLCALC, TRIG, CHOLHDL, LDLDIRECT in the last 72 hours. Thyroid Function Tests: No results for input(s): TSH, T4TOTAL, FREET4, T3FREE, THYROIDAB in the last 72 hours. Anemia Panel: No results for input(s): VITAMINB12, FOLATE, FERRITIN, TIBC, IRON, RETICCTPCT in the last 72 hours. Urine analysis:    Component Value Date/Time   COLORURINE AMBER (A) 01/10/2022 0124   APPEARANCEUR CLEAR 01/10/2022 0124   LABSPEC 1.017 01/10/2022 0124   PHURINE 6.0 01/10/2022 0124   GLUCOSEU NEGATIVE 01/10/2022 0124   HGBUR NEGATIVE 01/10/2022 0124   BILIRUBINUR NEGATIVE 01/10/2022 0124   KETONESUR 5 (A) 01/10/2022 0124   PROTEINUR 100 (A) 01/10/2022 0124   UROBILINOGEN 1.0 12/24/2014 0643   NITRITE NEGATIVE 01/10/2022 0124   LEUKOCYTESUR NEGATIVE 01/10/2022 0124    Radiological Exams on Admission: I have personally reviewed images No results found.  EKG: My personal interpretation of EKG shows: no EKG    Assessment/Plan Principal Problem:   Alcohol withdrawal syndrome without complication (HCC) Active Problems:   Hypokalemia   Abnormal LFTs   Tobacco dependence due to cigarettes   Schizoaffective disorder, depressive type (HCC)   GERD (gastroesophageal reflux disease)   HTN (hypertension)   Alcoholic cirrhosis of liver with ascites (HCC)    Assessment and Plan: * Alcohol withdrawal syndrome without complication ([place]) Admit to observation telemetry bed. Alcohol withdrawal syndrome without complication (HCC) is a Acute illness/condition that poses a threat to life or bodily function.  CIWA protocol. Prn IV ativan 1 mg for seizures.  TOC consult to help with drug rehab placement.  Discussed with the patient that if he does not decide to proceed with intensive alcohol  treatment program, then admission to the hospital  really serves no purpose as he will likely relapse and start drinking again.  He needs to make a commitment to himself that he is going to stop drinking.  Continue with Neurontin 300 mg 3 times daily.   Abnormal LFTs Acute.  Secondary to his alcohol use.  Repeat LFTs in the morning.  Hypokalemia Acute.  Continue with oral replacement 40 mEq p.o. every 4h x2.  Repeat BMP in the morning.  Alcoholic cirrhosis of liver with ascites (HCC) Chronic.  Patient continues to drink alcohol.  Patient has not decided if he wants to participate in long-term alcohol rehab.  He is at high risk for liver failure due to continued alcohol abuse.  Continue with lactulose, Aldactone 100 mg daily.  HTN (hypertension) Stable.  Continue Aldactone 100 mg daily.  Patient states he is no longer taking metoprolol.  GERD (gastroesophageal reflux disease) Stable.  Continue Protonix 40 mg daily  Schizoaffective disorder, depressive type (HCC) Chronic.  Stable.  Continue Seroquel 200 mg, Zoloft 25 mg  Tobacco dependence due to cigarettes Chronic.  Will order nicotine patch 21 mg   DVT prophylaxis: SQ Heparin Code Status: Full Code Family Communication: no family at bedside  Disposition Plan: return home vs inpatient/residential drug/etoh rehab  Consults called: none  Admission status: Observation, Telemetry bed   Kristopher Oppenheim, DO [facility] 01/10/2022, 6:00 AM

## 2022-01-10 NOTE — Hospital Course (Signed)
Patient 51 year old gentleman history of alcoholic cirrhosis, hypertension, polysubstance abuse, schizoaffective disorder presented to the ED with complaints of nausea vomiting and asking for help with alcohol cessation.  Last drink was on the day of admission.  Patient admitted, placed on the Ativan withdrawal protocol.  TOC consulted

## 2022-01-10 NOTE — Subjective & Objective (Signed)
Chief complaint: Nausea, vomiting, wants to stop drinking History of present illness: 51 year old male history of alcoholic cirrhosis, hypertension, polysubstance abuse, schizoaffective disorder, presents to the ER today with complaints of nausea, vomiting and want to stop drinking.  Last drink was today.  He normally drinks 4 Loco's.  Usually consumes about 5 cans a day.  Also been smoking marijuana.  He has a history of alcoholic cirrhosis.  He continues to abuse alcohol.  Has been having vomiting for 2 days.  On arrival temp 97.7 heart rate 71 blood pressure 136/96 satting 98% on room air  Labs: UDS positive for benzodiazepines and cannabis Sodium 141, potassium 2.9, BUN less than 5, creatinine 0.8  AST of 74, ALT of 82, alk phos of 127, total bili of 0.7  Magnesium 1.5  White count 12.8, hemoglobin 15.6, platelets 328  Ethanol level elevated at 114.  Patient started on CIWA protocol.  [facility] hospitalist contacted for admission.

## 2022-01-10 NOTE — Assessment & Plan Note (Addendum)
Admitted under observation to telemetry. Alcohol withdrawal syndrome without complication (HCC) is a Acute illness/condition that poses a threat to life or bodily function.  CIWA protocol. Prn IV ativan 1 mg for seizures.  TOC consult to help with drug rehab placement.   Dr. Bridgett Larsson discussed with the patient that if he does not decide to proceed with intensive alcohol treatment program, then admission to the hospital really serves no purpose as he will likely relapse and start drinking again.  He needs to make a commitment to himself that he is going to stop drinking.  Patient maintained on home regimen of Neurontin 300 mg 3 times daily.   -Patient placed on the Librium detox protocol which she completed during the hospitalization. -Patient maintained on thiamine, folic acid, multivitamin. -Patient also placed on IV Ativan as needed. -TOC consulted to give patient information for alcohol cessation. -Patient will be discharged in stable and improved condition.

## 2022-01-10 NOTE — Assessment & Plan Note (Addendum)
Chronic.  Stable.  -Patient was maintained on home regimen Seroquel 200 mg, Zoloft 25 mg.

## 2022-01-10 NOTE — Progress Notes (Signed)
Patient arrived on unit from ED.  CIWA score of 12.  Reports Nausea but no vomiting.  Mild headache and moderate tremors.  Stable vitals.  Complains of gout flare up in bilateral feet and bilateral hips.  Dr. Grandville Silos informed.    01/10/22 1400  Vitals  Temp 98.4 F (36.9 C)  Temp Source Oral  BP (!) 141/93  MAP (mmHg) 109  BP Location Left Arm  BP Method Automatic  Patient Position (if appropriate) Lying  Pulse Rate 86  Pulse Rate Source Monitor  ECG Heart Rate 85  Resp 20  Level of Consciousness  Level of Consciousness Alert  MEWS COLOR  MEWS Score Color Green  Oxygen Therapy  SpO2 95 %  O2 Device Nasal Cannula  O2 Flow Rate (L/min) 1 L/min  MEWS Score  MEWS Temp 0  MEWS Systolic 0  MEWS Pulse 0  MEWS RR 0  MEWS LOC 0  MEWS Score 0

## 2022-01-10 NOTE — ED Provider Notes (Signed)
[facility] EMERGENCY DEPARTMENT Provider Note   CSN: [id number] Arrival date & time: 01/09/22  2343     History  Chief Complaint  Patient presents with   Withdrawal    [name] is a 51 y.o. male.  The history is provided by the patient.  He has history of alcohol abuse, cirrhosis of the liver, schizoaffective disorder, hypertension, gout and comes in because of alcohol withdrawal.  He states that he normally will have 5 malt liquor beverages a day, last drink was at about noon.  He endorses history of DTs and alcohol withdrawal seizures.  He has had nausea and vomiting and has vomited since arrival in the ED.   Home Medications Prior to Admission medications   Medication Sig Start Date End Date Taking? Authorizing Provider  chlordiazePOXIDE (LIBRIUM) 25 MG capsule '50mg'$  PO TID x 1D, then 25-'50mg'$  PO BID X 1D, then 25-'50mg'$  PO QD X 1D 11/29/21   Long, [name], MD  gabapentin (NEURONTIN) 300 MG capsule Take 1 capsule (300 mg total) by mouth 3 (three) times daily. 05/15/21   [name], MD  lactulose (CHRONULAC) 10 GM/15ML solution Take 15 mLs (10 g total) by mouth 3 (three) times daily. Patient taking differently: Take 30 g by mouth 3 (three) times daily. 09/23/21   [name], MD  levETIRAcetam (KEPPRA) 500 MG tablet Take 1 tablet (500 mg total) by mouth 2 (two) times daily. 09/23/21   [name], MD  metoprolol tartrate (LOPRESSOR) 25 MG tablet Take 0.5 tablets (12.5 mg total) by mouth 2 (two) times daily. 09/23/21   [name], MD  MILK THISTLE PO Take 1 capsule by mouth 2 (two) times daily.    [provider]  Multiple Vitamin (MULTIVITAMIN WITH MINERALS) TABS tablet Take 1 tablet by mouth daily. 05/08/20   [place] Eric J, DO  NEOMYCIN-POLYMYXIN-HC, OPHTH, SUSP Apply 2 drops to eye in the morning, at noon, in the evening, and at bedtime. 12/06/21   [name], MD  ondansetron (ZOFRAN-ODT) 4 MG disintegrating tablet Take 1 tablet (4 mg total)  by mouth every 8 (eight) hours as needed for nausea or vomiting. 11/29/21   Long, [name], MD  pantoprazole (PROTONIX) 40 MG tablet Take 1 tablet (40 mg total) by mouth daily. 11/29/21   Long, [name], MD  QUEtiapine (SEROQUEL) 200 MG tablet Take 1 tablet (200 mg total) by mouth at bedtime. 12/05/21   [name], MD  sertraline (ZOLOFT) 25 MG tablet Take 1 tablet (25 mg total) by mouth daily. 12/05/21   [name], MD  spironolactone (ALDACTONE) 100 MG tablet Take 1 tablet (100 mg total) by mouth daily. 12/05/21   [name], MD  thiamine 100 MG tablet Take 1 tablet (100 mg total) by mouth daily. Patient not taking: Reported on 11/29/2021 09/24/21   [name], MD  vitamin C (VITAMIN C) 250 MG tablet Take 1 tablet (250 mg total) by mouth 2 (two) times daily. Patient taking differently: Take 250 mg by mouth daily. 08/06/20   [name], MD      Allergies    Bupropion    Review of Systems   Review of Systems  All other systems reviewed and are negative.  Physical Exam Updated Vital Signs BP (!) 135/96   Pulse 76   Temp 97.7 F (36.5 C) (Oral)   Resp 20   Ht 6' (1.829 m)   Wt 99.8 kg   SpO2 98%   BMI 29.84  kg/m  Physical Exam Vitals and nursing note reviewed.  51 year old male, resting comfortably and in no acute distress. Vital signs are significant for mildly elevated diastolic blood pressure. Oxygen saturation is 98%, which is normal. Head is normocephalic and atraumatic. PERRLA, EOMI. Oropharynx is clear. Neck is nontender and supple without adenopathy or JVD. Back is nontender and there is no CVA tenderness. Lungs are clear without rales, wheezes, or rhonchi. Chest is nontender. Heart has regular rate and rhythm without murmur. Abdomen is soft, flat, nontender.  Peristalsis is hypoactive. Extremities have no cyanosis or edema, full range of motion is present. Skin is warm and dry without rash. Neurologic: Mental status is normal, cranial nerves are intact,  moves all extremities equally.  He is moderately tremulous.  ED Results / Procedures / Treatments   Labs (all labs ordered are listed, but only abnormal results are displayed) Labs Reviewed  COMPREHENSIVE METABOLIC PANEL - Abnormal; Notable for the following components:      Result Value   Potassium 2.9 (*)    Chloride 96 (*)    Glucose, Bld 136 (*)    BUN <5 (*)    Total Protein 8.2 (*)    AST 74 (*)    ALT 82 (*)    Alkaline Phosphatase 127 (*)    Anion gap 16 (*)    All other components within normal limits  ETHANOL - Abnormal; Notable for the following components:   Alcohol, Ethyl (B) 114 (*)    All other components within normal limits  CBC WITH DIFFERENTIAL/PLATELET - Abnormal; Notable for the following components:   WBC 12.8 (*)    RDW 16.0 (*)    Neutro Abs 10.1 (*)    Monocytes Absolute 1.2 (*)    All other components within normal limits  RAPID URINE DRUG SCREEN, HOSP PERFORMED - Abnormal; Notable for the following components:   Benzodiazepines POSITIVE (*)    Tetrahydrocannabinol POSITIVE (*)    All other components within normal limits  URINALYSIS, ROUTINE W REFLEX MICROSCOPIC - Abnormal; Notable for the following components:   Color, Urine AMBER (*)    Ketones, ur 5 (*)    Protein, ur 100 (*)    Bacteria, UA RARE (*)    All other components within normal limits  MAGNESIUM - Abnormal; Notable for the following components:   Magnesium 1.5 (*)    All other components within normal limits   Procedures Procedures  Cardiac monitor shows normal sinus rhythm, per my interpretation.  Medications Ordered in ED Medications  LORazepam (ATIVAN) injection 0-4 mg ( Intravenous See Alternative 01/10/22 0127)    Or  LORazepam (ATIVAN) tablet 0-4 mg (2 mg Oral Given 01/10/22 0127)  LORazepam (ATIVAN) injection 0-4 mg (has no administration in time range)    Or  LORazepam (ATIVAN) tablet 0-4 mg (has no administration in time range)  thiamine tablet 100 mg (has no  administration in time range)    Or  thiamine (B-1) injection 100 mg (has no administration in time range)  lactated ringers bolus 1,000 mL (0 mLs Intravenous Stopped 01/10/22 0225)  ondansetron (ZOFRAN-ODT) disintegrating tablet 8 mg (8 mg Oral Given 01/10/22 0042)  LORazepam (ATIVAN) injection 2 mg (2 mg Intramuscular Given 01/10/22 0041)  lactated ringers bolus 1,000 mL (0 mLs Intravenous Stopped 01/10/22 0445)  potassium chloride 10 mEq in 100 mL IVPB (0 mEq Intravenous Stopped 01/10/22 0444)  potassium chloride SA (KLOR-CON M) CR tablet 40 mEq (40 mEq Oral Given 01/10/22 0326)  magnesium sulfate IVPB 2 g 50 mL (0 g Intravenous Stopped 01/10/22 0426)    ED Course/ Medical Decision Making/ A&P                           Medical Decision Making Amount and/or Complexity of Data Reviewed Labs: ordered.  Risk OTC drugs. Prescription drug management.   Alcohol withdrawal.  Old records are reviewed, and he has had prior hospitalizations for alcohol withdrawal.  He will will be given ondansetron for nausea and lorazepam for withdrawal symptoms and started on CIWA protocol.  Screening labs are ordered, including magnesium.  I have reviewed and interpreted all of the labs.  He has mild leukocytosis which is nonspecific.  Mild elevation of transaminases and alkaline phosphatase which are consistent with alcohol abuse.  He is severely hypokalemic with potassium 2.9 and also moderately hypomagnesemic with a magnesium of 1.5.  He is given oral and intravenous potassium, intravenous magnesium.  Patient was reevaluated and continues to be extremely jittery.  He states nausea is somewhat improved.  He is not safe for discharge.  Ethanol level is still legally intoxicated at 114.  Case is discussed with Dr. Bridgett Larsson of [facility], who agrees to admit the patient.  CRITICAL CARE Performed by: Delora Fuel Total critical care time: 95 minutes Critical care time was exclusive of separately billable  procedures and treating other patients. Critical care was necessary to treat or prevent imminent or life-threatening deterioration. Critical care was time spent personally by me on the following activities: development of treatment plan with patient and/or surrogate as well as nursing, discussions with consultants, evaluation of patient's response to treatment, examination of patient, obtaining history from patient or surrogate, ordering and performing treatments and interventions, ordering and review of laboratory studies, ordering and review of radiographic studies, pulse oximetry and re-evaluation of patient's condition.  Final Clinical Impression(s) / ED Diagnoses Final diagnoses:  Alcohol withdrawal syndrome without complication (HCC)  Nausea and vomiting, unspecified vomiting type  Hypokalemia  Hypomagnesemia  Elevated liver function tests    Rx / DC Orders ED Discharge Orders     None         Delora Fuel, MD 73/71/06 0532

## 2022-01-11 DIAGNOSIS — F10930 Alcohol use, unspecified with withdrawal, uncomplicated: Secondary | ICD-10-CM | POA: Diagnosis not present

## 2022-01-11 DIAGNOSIS — M109 Gout, unspecified: Secondary | ICD-10-CM | POA: Diagnosis not present

## 2022-01-11 DIAGNOSIS — K7031 Alcoholic cirrhosis of liver with ascites: Secondary | ICD-10-CM | POA: Diagnosis not present

## 2022-01-11 DIAGNOSIS — R7989 Other specified abnormal findings of blood chemistry: Secondary | ICD-10-CM | POA: Diagnosis not present

## 2022-01-11 LAB — COMPREHENSIVE METABOLIC PANEL
ALT: 56 U/L — ABNORMAL HIGH (ref 0–44)
AST: 48 U/L — ABNORMAL HIGH (ref 15–41)
Albumin: 3.4 g/dL — ABNORMAL LOW (ref 3.5–5.0)
Alkaline Phosphatase: 102 U/L (ref 38–126)
Anion gap: 8 (ref 5–15)
BUN: 8 mg/dL (ref 6–20)
CO2: 25 mmol/L (ref 22–32)
Calcium: 9.6 mg/dL (ref 8.9–10.3)
Chloride: 103 mmol/L (ref 98–111)
Creatinine, Ser: 0.86 mg/dL (ref 0.61–1.24)
GFR, Estimated: >60 mL/min (ref >60)
Glucose, Bld: 161 mg/dL — ABNORMAL HIGH (ref 70–99)
Potassium: 4.2 mmol/L (ref 3.5–5.1)
Sodium: 136 mmol/L (ref 135–145)
Total Bilirubin: 0.9 mg/dL (ref 0.3–1.2)
Total Protein: 6.6 g/dL (ref 6.5–8.1)

## 2022-01-11 LAB — CBC
HCT: 41.7 % (ref 39.0–52.0)
Hemoglobin: 14.1 g/dL (ref 13.0–17.0)
MCH: 33.7 pg (ref 26.0–34.0)
MCHC: 33.8 g/dL (ref 30.0–36.0)
MCV: 99.5 fL (ref 80.0–100.0)
Platelets: 218 10*3/uL (ref 150–400)
RBC: 4.19 MIL/uL — ABNORMAL LOW (ref 4.22–5.81)
RDW: 15.8 % — ABNORMAL HIGH (ref 11.5–15.5)
WBC: 4.2 10*3/uL (ref 4.0–10.5)
nRBC: 0 % (ref 0.0–0.2)

## 2022-01-11 LAB — MAGNESIUM: Magnesium: 2.2 mg/dL (ref 1.7–2.4)

## 2022-01-11 MED ORDER — IBUPROFEN 400 MG PO TABS
200.0000 mg | ORAL_TABLET | Freq: Once | ORAL | Status: AC
  Administered 2022-01-11: 200 mg via ORAL
  Filled 2022-01-11: qty 1

## 2022-01-11 NOTE — Evaluation (Signed)
Physical Therapy Evaluation & Discharge Patient Details Name: [name] MRN: [id number] DOB: 28-Jan-1971 Today's Date: 01/11/2022  History of Present Illness  Pt is a 51 y.o. male admitted 01/09/22 with c/o nausea/vomiting, asking for help with alcohol cessation. PMH includes alcoholic cirrhosis, HTN, polysubstance abuse, schizoaffective disorder, gout.   Clinical Impression  Patient evaluated by Physical Therapy with no further acute PT needs identified. PTA, pt lives alone, enjoys playing video games, independent with mobility; pt walks to/from grocery store for food. Today, pt indep with mobility and ADL tasks. Encouraged more frequent OOB activity while admitted (hallway ambulation at least 5x/day). All education has been completed and the patient has no further questions. Acute PT is signing off. Thank you for this referral.    SpO2 97% on RA HR 97 with ambulation    Recommendations for follow up therapy are one component of a multi-disciplinary discharge planning process, led by the attending physician.  Recommendations may be updated based on patient status, additional functional criteria and insurance authorization.  Follow Up Recommendations No PT follow up    Assistance Recommended at Discharge PRN  Patient can return home with the following  Assist for transportation    Equipment Recommendations None recommended by PT  Recommendations for Other Services       Functional Status Assessment Patient has not had a recent decline in their functional status     Precautions / Restrictions Precautions Precautions: None Restrictions Weight Bearing Restrictions: No      Mobility  Bed Mobility Overal bed mobility: Independent                  Transfers Overall transfer level: Independent Equipment used: None                    Ambulation/Gait Ambulation/Gait assistance: Independent Gait Distance (Feet): 500 Feet Assistive device: None Gait  Pattern/deviations: Step-through pattern, Decreased stride length, Trunk flexed Gait velocity: Decreased     General Gait Details: slow, steady gait without DME; intermittent shuffling steps with wide BOS, pt endorses bilateral foot neuropathy  Stairs Stairs: Yes Stairs assistance: Modified independent (Device/Increase time) Stair Management: Two rails, Alternating pattern, Step to pattern, Forwards Number of Stairs: 10 General stair comments: ascend 10 stairs with BUE rail support, alternating; descend with step-to pattern  Wheelchair Mobility    Modified Rankin (Stroke Patients Only)       Balance Overall balance assessment: Mild deficits observed, not formally tested                                           Pertinent Vitals/Pain Pain Assessment Pain Assessment: Faces Faces Pain Scale: Hurts a little bit Pain Location: generalized Pain Descriptors / Indicators: Sore Pain Intervention(s): Monitored during session    Home Living Family/patient expects to be discharged to:: Private residence Living Arrangements: Alone Available Help at Discharge: Family;Available PRN/intermittently Type of Home: House Home Access: Stairs to enter Entrance Stairs-Rails: Psychiatric nurse of Steps: 2   Home Layout: One level Home Equipment: Shower seat      Prior Function Prior Level of Function : Independent/Modified Independent             Mobility Comments: Typically independent without DME; walks 2-3 blocks to grocery store and brings home items in backpack; primarily sedentary, enjoys computer games ADLs Comments: sits to shower due to balance issues  Hand Dominance        Extremity/Trunk Assessment   Upper Extremity Assessment Upper Extremity Assessment: Overall WFL for tasks assessed    Lower Extremity Assessment Lower Extremity Assessment: RLE deficits/detail;LLE deficits/detail RLE Deficits / Details: functionally >4/5  strength throughout RLE Sensation: history of peripheral neuropathy LLE Deficits / Details: functionally >4/5 strength throughout LLE Sensation: history of peripheral neuropathy       Communication   Communication: No difficulties  Cognition Arousal/Alertness: Awake/alert Behavior During Therapy: Flat affect Overall Cognitive Status: Within Functional Limits for tasks assessed                                          General Comments General comments (skin integrity, edema, etc.): pt with bilateral foot neuropathy - educ on importance of daily skin checks for wounds    Exercises     Assessment/Plan    PT Assessment Patient does not need any further PT services  PT Problem List         PT Treatment Interventions      PT Goals (Current goals can be found in the Care Plan section)  Acute Rehab PT Goals PT Goal Formulation: All assessment and education complete, DC therapy    Frequency       Co-evaluation               AM-PAC PT "6 Clicks" Mobility  Outcome Measure Help needed turning from your back to your side while in a flat bed without using bedrails?: None Help needed moving from lying on your back to sitting on the side of a flat bed without using bedrails?: None Help needed moving to and from a bed to a chair (including a wheelchair)?: None Help needed standing up from a chair using your arms (e.g., wheelchair or bedside chair)?: None Help needed to walk in hospital room?: None Help needed climbing 3-5 steps with a railing? : None 6 Click Score: 24    End of Session Equipment Utilized During Treatment: Gait belt Activity Tolerance: Patient tolerated treatment well Patient left: in bed;with call bell/phone within reach Nurse Communication: Mobility status PT Visit Diagnosis: Other abnormalities of gait and mobility (R26.89)    Time: 4098-1191 PT Time Calculation (min) (ACUTE ONLY): 20 min   Charges:   PT Evaluation $PT Eval Low  Complexity: [place], PT, DPT Acute Rehabilitation Services  Pager [phone] Office [place] 01/11/2022, 1:03 PM

## 2022-01-11 NOTE — Assessment & Plan Note (Signed)
-   Likely secondary to withdrawal. -Improving. -IV antiemetics, supportive care.

## 2022-01-11 NOTE — Progress Notes (Signed)
PROGRESS NOTE    [name]  WNU:[id number] DOB: Oct 14, 1970 DOA: 01/09/2022 PCP: [name], MD    Chief Complaint  Patient presents with   Withdrawal    Brief Narrative:  Patient 51 year old gentleman history of alcoholic cirrhosis, hypertension, polysubstance abuse, schizoaffective disorder presented to the ED with complaints of nausea vomiting and asking for help with alcohol cessation.  Last drink was on the day of admission.  Patient admitted, placed on the Ativan withdrawal protocol.  TOC consulted    Assessment & Plan:  Principal Problem:   Alcohol withdrawal syndrome without complication (HCC) Active Problems:   Hypokalemia   Abnormal LFTs   Tobacco dependence due to cigarettes   Schizoaffective disorder, depressive type (HCC)   GERD (gastroesophageal reflux disease)   HTN (hypertension)   Alcoholic cirrhosis of liver with ascites (HCC)   Acute gout   Elevated liver function tests   Nausea and vomiting    Assessment and Plan: * Alcohol withdrawal syndrome without complication ([place]) Admitted under observation to telemetry. Alcohol withdrawal syndrome without complication (HCC) is a Acute illness/condition that poses a threat to life or bodily function.  CIWA protocol. Prn IV ativan 1 mg for seizures.  TOC consult to help with drug rehab placement.   Dr. Bridgett Larsson discussed with the patient that if he does not decide to proceed with intensive alcohol treatment program, then admission to the hospital really serves no purpose as he will likely relapse and start drinking again.  He needs to make a commitment to himself that he is going to stop drinking.  Continue with Neurontin 300 mg 3 times daily. -Continue on the Librium detox protocol. -Continue IV Ativan as needed.   Abnormal LFTs Acute.  Secondary to his alcohol use.   -LFTs slowly trending down.   -Outpatient follow-up.  Hypokalemia Acute.  -Likely secondary to GI losses.  -Repleted.  -Potassium of  4.2.  Alcoholic cirrhosis of liver with ascites (HCC) Chronic.  Patient continues to drink alcohol.  Patient has not decided if he wants to participate in long-term alcohol rehab.  He is at high risk for liver failure due to continued alcohol abuse.  Continue with lactulose, Aldactone 100 mg daily.  HTN (hypertension) Stable.   -Continue Aldactone 100 mg daily.  -It is noted per admitting physician that patient stated no longer on metoprolol.   GERD (gastroesophageal reflux disease) - Continue PPI.  Schizoaffective disorder, depressive type (HCC) Chronic.  Stable.  - Continue home regimen Seroquel 200 mg, Zoloft 25 mg.  Tobacco dependence due to cigarettes Chronic.   Tobacco cessation. Continue nicotine patch.   Nausea and vomiting - Likely secondary to withdrawal. -Improving. -IV antiemetics, supportive care.  Acute gout - Patient with complaints of acute gouty flare to RN on 01/10/2022. -Patient started on Solu-Medrol 60 mg IV x1 yesterday and subsequently on prednisone 40 mg daily for the next 4 to 5 days. -Slowly clinically improving. -Supportive care.         DVT prophylaxis: Heparin>>> Lovenox. Code Status: Full Family Communication: Updated patient.  No family at bedside. Disposition: Home when clinically improved with no further symptoms of withdrawal and completion of detox protocol.  Status is: Observation The patient remains OBS appropriate and will d/c before 2 midnights.   Consultants:  None  Procedures:  None  Antimicrobials:  None   Subjective: Sleeping deeply however arousable.  Denies any chest pain.  No shortness of breath.  No abdominal pain.  Improvement with heartburn.  No emesis.  Some occasional bouts of nausea however tolerating oral intake.  Stated had 3 watery loose stools between yesterday and today.  1 watery loose stools this morning.  Overall feeling better.  States bilateral feet and hip pain slowly improving after being started  on steroids.  Objective: Vitals:   01/10/22 2004 01/10/22 2338 01/11/22 0458 01/11/22 0834  BP: (!) 142/85 111/78  129/83  Pulse: 69 80 65 76  Resp: '15 18 17 20  '$ Temp: 98.4 F (36.9 C) 97.8 F (36.6 C) 97.6 F (36.4 C) 97.9 F (36.6 C)  TempSrc: Oral Oral Oral Oral  SpO2: 94% 90% 93% 95%  Weight:   102.7 kg   Height:        Intake/Output Summary (Last 24 hours) at 01/11/2022 0959 Last data filed at 01/11/2022 0830 Gross per 24 hour  Intake 640 ml  Output 1 ml  Net 639 ml   Filed Weights   01/09/22 2357 01/11/22 0458  Weight: 99.8 kg 102.7 kg    Examination:  General exam: NAD.  Decreased tremors.  Respiratory system: CTA B anterior lung fields.  No wheezes, no crackles, no rhonchi.  Normal respiratory effort.   Cardiovascular system: Regular rate rhythm no murmurs rubs or gallops.  No JVD.  No lower extremity edema.   Gastrointestinal system: Abdomen soft, nontender, nondistended, positive bowel sounds.  No rebound.  No guarding.  Central nervous system: Alert and oriented. No focal neurological deficits. Extremities: Right great toe with some tenderness to palpation, bilateral ankles with some tenderness to palpation.  Moving extremities spontaneously.  Symmetric 5 x 5 power. Skin: No rashes, lesions or ulcers Psychiatry: Judgement and insight appear normal. Mood & affect appropriate.     Data Reviewed:   CBC: Recent Labs  Lab 01/10/22 0104 01/11/22 0134  WBC 12.8* 4.2  NEUTROABS 10.1*  --   HGB 15.6 14.1  HCT 45.1 41.7  MCV 97.0 99.5  PLT 328 326    Basic Metabolic Panel: Recent Labs  Lab 01/10/22 0104 01/10/22 1021 01/11/22 0134  NA 141 140 136  K 2.9* 3.7 4.2  CL 96* 99 103  CO2 '29 29 25  '$ GLUCOSE 136* 129* 161*  BUN <5* 5* 8  CREATININE 0.85 0.95 0.86  CALCIUM 9.6 9.5 9.6  MG 1.5* 2.1 2.2    GFR: Estimated Creatinine Clearance: 128.7 mL/min (by C-G formula based on SCr of 0.86 mg/dL).  Liver Function Tests: Recent Labs  Lab  01/10/22 0104 01/11/22 0134  AST 74* 48*  ALT 82* 56*  ALKPHOS 127* 102  BILITOT 0.7 0.9  PROT 8.2* 6.6  ALBUMIN 4.7 3.4*    CBG: No results for input(s): GLUCAP in the last 168 hours.   No results found for this or any previous visit (from the past 240 hour(s)).       Radiology Studies: No results found.      Scheduled Meds:  chlordiazePOXIDE  25 mg Oral TID   Followed by   Derrill Memo ON 01/12/2022] chlordiazePOXIDE  25 mg Oral BH-qamhs   Followed by   Derrill Memo ON 01/13/2022] chlordiazePOXIDE  25 mg Oral Daily   enoxaparin (LOVENOX) injection  40 mg Subcutaneous Q24H   gabapentin  300 mg Oral TID   lactulose  30 g Oral TID   LORazepam  0-4 mg Intravenous Q6H   Or   LORazepam  0-4 mg Oral Q6H   [START ON 01/12/2022] LORazepam  0-4 mg Intravenous Q12H   Or   [START ON 01/12/2022] LORazepam  0-4 mg Oral Q12H   multivitamin with minerals  1 tablet Oral Daily   nicotine  21 mg Transdermal Daily   pantoprazole  40 mg Oral BID AC   predniSONE  40 mg Oral QAC breakfast   QUEtiapine  200 mg Oral QHS   sertraline  25 mg Oral Daily   spironolactone  100 mg Oral Daily   thiamine  100 mg Oral Daily   Continuous Infusions:   LOS: 0 days    Time spent: 35 minutes     Irine Seal, MD [facility]   To contact the attending provider between 7A-7P or the covering provider during after hours 7P-7A, please log into the web site [email or web address] and access using universal [place] password for that web site. If you do not have the password, please call the hospital operator.  01/11/2022, 9:59 AM

## 2022-01-12 DIAGNOSIS — F10930 Alcohol use, unspecified with withdrawal, uncomplicated: Secondary | ICD-10-CM | POA: Diagnosis not present

## 2022-01-12 DIAGNOSIS — M109 Gout, unspecified: Secondary | ICD-10-CM | POA: Diagnosis not present

## 2022-01-12 DIAGNOSIS — R7989 Other specified abnormal findings of blood chemistry: Secondary | ICD-10-CM | POA: Diagnosis not present

## 2022-01-12 DIAGNOSIS — K7031 Alcoholic cirrhosis of liver with ascites: Secondary | ICD-10-CM | POA: Diagnosis not present

## 2022-01-12 LAB — COMPREHENSIVE METABOLIC PANEL
ALT: 63 U/L — ABNORMAL HIGH (ref 0–44)
AST: 62 U/L — ABNORMAL HIGH (ref 15–41)
Albumin: 3.7 g/dL (ref 3.5–5.0)
Alkaline Phosphatase: 91 U/L (ref 38–126)
Anion gap: 6 (ref 5–15)
BUN: 21 mg/dL — ABNORMAL HIGH (ref 6–20)
CO2: 27 mmol/L (ref 22–32)
Calcium: 9.1 mg/dL (ref 8.9–10.3)
Chloride: 103 mmol/L (ref 98–111)
Creatinine, Ser: 1.17 mg/dL (ref 0.61–1.24)
GFR, Estimated: >60 mL/min (ref >60)
Glucose, Bld: 111 mg/dL — ABNORMAL HIGH (ref 70–99)
Potassium: 4.2 mmol/L (ref 3.5–5.1)
Sodium: 136 mmol/L (ref 135–145)
Total Bilirubin: 0.6 mg/dL (ref 0.3–1.2)
Total Protein: 6.5 g/dL (ref 6.5–8.1)

## 2022-01-12 LAB — CBC
HCT: 41.4 % (ref 39.0–52.0)
Hemoglobin: 13.9 g/dL (ref 13.0–17.0)
MCH: 33.7 pg (ref 26.0–34.0)
MCHC: 33.6 g/dL (ref 30.0–36.0)
MCV: 100.5 fL — ABNORMAL HIGH (ref 80.0–100.0)
Platelets: 200 10*3/uL (ref 150–400)
RBC: 4.12 MIL/uL — ABNORMAL LOW (ref 4.22–5.81)
RDW: 15.5 % (ref 11.5–15.5)
WBC: 9.1 10*3/uL (ref 4.0–10.5)
nRBC: 0 % (ref 0.0–0.2)

## 2022-01-12 LAB — MAGNESIUM: Magnesium: 2.1 mg/dL (ref 1.7–2.4)

## 2022-01-12 MED ORDER — IPRATROPIUM-ALBUTEROL 0.5-2.5 (3) MG/3ML IN SOLN: 3.0000 mL | RESPIRATORY_TRACT | Status: DC | PRN

## 2022-01-12 MED ORDER — ONDANSETRON HCL 4 MG/2ML IJ SOLN
4.0000 mg | Freq: Once | INTRAMUSCULAR | Status: DC
  Filled 2022-01-12: qty 2

## 2022-01-12 MED ORDER — IPRATROPIUM-ALBUTEROL 0.5-2.5 (3) MG/3ML IN SOLN
3.0000 mL | Freq: Once | RESPIRATORY_TRACT | Status: AC
  Administered 2022-01-12: 3 mL via RESPIRATORY_TRACT
  Filled 2022-01-12: qty 3

## 2022-01-12 NOTE — Progress Notes (Signed)
PROGRESS NOTE    [name]  XFG:[id number] DOB: 05-30-1971 DOA: 01/09/2022 PCP: [name], MD    Chief Complaint  Patient presents with   Withdrawal    Brief Narrative:  Patient 51 year old gentleman history of alcoholic cirrhosis, hypertension, polysubstance abuse, schizoaffective disorder presented to the ED with complaints of nausea vomiting and asking for help with alcohol cessation.  Last drink was on the day of admission.  Patient admitted, placed on the Ativan withdrawal protocol.  TOC consulted    Assessment & Plan:  Principal Problem:   Alcohol withdrawal syndrome without complication (HCC) Active Problems:   Hypokalemia   Abnormal LFTs   Tobacco dependence due to cigarettes   Schizoaffective disorder, depressive type (HCC)   GERD (gastroesophageal reflux disease)   HTN (hypertension)   Alcoholic cirrhosis of liver with ascites (HCC)   Acute gout   Elevated liver function tests   Nausea and vomiting    Assessment and Plan: * Alcohol withdrawal syndrome without complication ([place]) Admitted under observation to telemetry. Alcohol withdrawal syndrome without complication (HCC) is a Acute illness/condition that poses a threat to life or bodily function.  CIWA protocol. Prn IV ativan 1 mg for seizures.  TOC consult to help with drug rehab placement.   Dr. Bridgett Larsson discussed with the patient that if he does not decide to proceed with intensive alcohol treatment program, then admission to the hospital really serves no purpose as he will likely relapse and start drinking again.  He needs to make a commitment to himself that he is going to stop drinking.  Continue with Neurontin 300 mg 3 times daily. -Continue on the Librium detox protocol. -Continue thiamine, folic acid, multivitamin. -Continue IV Ativan as needed.   Abnormal LFTs Acute.  Secondary to his alcohol use.   -LFTs trended down but fluctuating.  -Outpatient follow-up.  Hypokalemia Acute.  -Likely  secondary to GI losses.  -Repleted.  -Potassium of 4.2.  Alcoholic cirrhosis of liver with ascites (HCC) Chronic.  Patient continues to drink alcohol.  Patient has not decided if he wants to participate in long-term alcohol rehab.  He is at high risk for liver failure due to continued alcohol abuse.  Continue with lactulose, Aldactone 100 mg daily.  HTN (hypertension) Stable.   -Continue Aldactone 100 mg daily.  -It is noted per admitting physician that patient stated no longer on metoprolol.   GERD (gastroesophageal reflux disease) - Continue PPI.  Schizoaffective disorder, depressive type (HCC) Chronic.  Stable.  - Continue home regimen Seroquel 200 mg, Zoloft 25 mg.  Tobacco dependence due to cigarettes Chronic.   -Patient with minimal wheezing on examination. -DuoNebs x1 now and then as needed. Tobacco cessation. Continue nicotine patch.   Nausea and vomiting - Likely secondary to withdrawal. -Improving. -IV antiemetics, supportive care.  Acute gout - Patient with complaints of acute gouty flare to RN on 01/10/2022. -Patient started on Solu-Medrol 60 mg IV x1 yesterday and subsequently on prednisone 40 mg daily for the next 4 to 5 days. -Slowly clinically improving. -Supportive care.         DVT prophylaxis: Heparin>>> Lovenox. Code Status: Full Family Communication: Updated patient.  No family at bedside. Disposition: Home when clinically improved with no further symptoms of withdrawal and completion of detox protocol.  Status is: Observation The patient remains OBS appropriate and will d/c before 2 midnights.   Consultants:  None  Procedures:  None  Antimicrobials:  None   Subjective: Sleeping but arousable.  No chest pain.  No shortness of breath.  No abdominal pain.  Overall feeling better.  Feels ankle and hip pain have improved.  Complaining of a pins-and-needles in his feet.  Would like his diet changed back to a regular diet and agrees to  ordering low salt foods.  Some complaints of nausea.  Objective: Vitals:   01/12/22 0430 01/12/22 0500 01/12/22 0617 01/12/22 0818  BP: 119/77  (!) 129/97 112/78  Pulse: 65   60  Resp: 17   18  Temp: 97.7 F (36.5 C)   97.8 F (36.6 C)  TempSrc: Oral   Oral  SpO2: 100%   96%  Weight:  105.3 kg    Height:        Intake/Output Summary (Last 24 hours) at 01/12/2022 1022 Last data filed at 01/12/2022 0817 Gross per 24 hour  Intake 840 ml  Output --  Net 840 ml   Filed Weights   01/09/22 2357 01/11/22 0458 01/12/22 0500  Weight: 99.8 kg 102.7 kg 105.3 kg    Examination:  General exam: NAD.  Decreased tremors. Respiratory system: Minimal expiratory wheezing.  No crackles.  No rhonchi.  Fair air movement.  Speaking in full sentences.  Cardiovascular system: RRR no murmurs rubs or gallops.  No JVD.  No lower extremity edema.   Gastrointestinal system: Abdomen is soft, nontender, nondistended, positive bowel sounds.  No rebound.  No guarding.   Central nervous system: Alert and oriented. No focal neurological deficits. Extremities: Right great toe with decreased tenderness to palpation, decreased tenderness in bilateral ankles.  Moving extremities spontaneously. Symmetric 5 x 5 power. Skin: No rashes, lesions or ulcers Psychiatry: Judgement and insight appear normal. Mood & affect appropriate.     Data Reviewed:   CBC: Recent Labs  Lab 01/10/22 0104 01/11/22 0134 01/12/22 0551  WBC 12.8* 4.2 9.1  NEUTROABS 10.1*  --   --   HGB 15.6 14.1 13.9  HCT 45.1 41.7 41.4  MCV 97.0 99.5 100.5*  PLT 328 218 630    Basic Metabolic Panel: Recent Labs  Lab 01/10/22 0104 01/10/22 1021 01/11/22 0134 01/12/22 0551  NA 141 140 136 136  K 2.9* 3.7 4.2 4.2  CL 96* 99 103 103  CO2 '29 29 25 27  '$ GLUCOSE 136* 129* 161* 111*  BUN <5* 5* 8 21*  CREATININE 0.85 0.95 0.86 1.17  CALCIUM 9.6 9.5 9.6 9.1  MG 1.5* 2.1 2.2 2.1    GFR: Estimated Creatinine Clearance: 95.8 mL/min (by  C-G formula based on SCr of 1.17 mg/dL).  Liver Function Tests: Recent Labs  Lab 01/10/22 0104 01/11/22 0134 01/12/22 0551  AST 74* 48* 62*  ALT 82* 56* 63*  ALKPHOS 127* 102 91  BILITOT 0.7 0.9 0.6  PROT 8.2* 6.6 6.5  ALBUMIN 4.7 3.4* 3.7    CBG: No results for input(s): GLUCAP in the last 168 hours.   No results found for this or any previous visit (from the past 240 hour(s)).       Radiology Studies: No results found.      Scheduled Meds:  chlordiazePOXIDE  25 mg Oral BH-qamhs   Followed by   Derrill Memo ON 01/13/2022] chlordiazePOXIDE  25 mg Oral Daily   enoxaparin (LOVENOX) injection  40 mg Subcutaneous Q24H   gabapentin  300 mg Oral TID   lactulose  30 g Oral TID   LORazepam  0-4 mg Intravenous Q12H   Or   LORazepam  0-4 mg Oral Q12H   multivitamin with minerals  1  tablet Oral Daily   nicotine  21 mg Transdermal Daily   pantoprazole  40 mg Oral BID AC   predniSONE  40 mg Oral QAC breakfast   QUEtiapine  200 mg Oral QHS   sertraline  25 mg Oral Daily   spironolactone  100 mg Oral Daily   thiamine  100 mg Oral Daily   Continuous Infusions:   LOS: 0 days    Time spent: 35 minutes     Irine Seal, MD [facility]   To contact the attending provider between 7A-7P or the covering provider during after hours 7P-7A, please log into the web site [email or web address] and access using universal [place] password for that web site. If you do not have the password, please call the hospital operator.  01/12/2022, 10:22 AM

## 2022-01-13 DIAGNOSIS — R7989 Other specified abnormal findings of blood chemistry: Secondary | ICD-10-CM | POA: Diagnosis not present

## 2022-01-13 DIAGNOSIS — F10930 Alcohol use, unspecified with withdrawal, uncomplicated: Secondary | ICD-10-CM | POA: Diagnosis not present

## 2022-01-13 DIAGNOSIS — M109 Gout, unspecified: Secondary | ICD-10-CM | POA: Diagnosis not present

## 2022-01-13 DIAGNOSIS — K7031 Alcoholic cirrhosis of liver with ascites: Secondary | ICD-10-CM | POA: Diagnosis not present

## 2022-01-13 LAB — BASIC METABOLIC PANEL
Anion gap: 6 (ref 5–15)
BUN: 20 mg/dL (ref 6–20)
CO2: 24 mmol/L (ref 22–32)
Calcium: 9.4 mg/dL (ref 8.9–10.3)
Chloride: 107 mmol/L (ref 98–111)
Creatinine, Ser: 1.06 mg/dL (ref 0.61–1.24)
GFR, Estimated: >60 mL/min (ref >60)
Glucose, Bld: 120 mg/dL — ABNORMAL HIGH (ref 70–99)
Potassium: 4.7 mmol/L (ref 3.5–5.1)
Sodium: 137 mmol/L (ref 135–145)

## 2022-01-13 MED ORDER — ONDANSETRON 4 MG PO TBDP: 4.0000 mg | ORAL_TABLET | Freq: Three times a day (TID) | ORAL | 0 refills | Status: DC | PRN

## 2022-01-13 MED ORDER — PANTOPRAZOLE SODIUM 40 MG PO TBEC: 40.0000 mg | DELAYED_RELEASE_TABLET | Freq: Every day | ORAL | 0 refills | Status: DC

## 2022-01-13 MED ORDER — HYDROXYZINE HCL 25 MG PO TABS: 25.0000 mg | ORAL_TABLET | Freq: Four times a day (QID) | ORAL | 0 refills | Status: DC | PRN

## 2022-01-13 MED ORDER — PREDNISONE 20 MG PO TABS: 40.0000 mg | ORAL_TABLET | Freq: Every day | ORAL | 0 refills | Status: AC

## 2022-01-13 MED ORDER — NICOTINE 21 MG/24HR TD PT24: 21.0000 mg | MEDICATED_PATCH | Freq: Every day | TRANSDERMAL | 0 refills | Status: DC

## 2022-01-13 MED ORDER — SPIRONOLACTONE 100 MG PO TABS: 100.0000 mg | ORAL_TABLET | Freq: Every day | ORAL | 1 refills | Status: DC

## 2022-01-13 MED ORDER — THIAMINE HCL 100 MG PO TABS: 100.0000 mg | ORAL_TABLET | Freq: Every day | ORAL | 3 refills | Status: DC

## 2022-01-13 MED ORDER — ADULT MULTIVITAMIN W/MINERALS CH: 1.0000 | ORAL_TABLET | Freq: Every day | ORAL | Status: DC

## 2022-01-13 NOTE — Discharge Summary (Signed)
Physician Discharge Summary  [name] VFI:[id number] DOB: 04-14-1971 DOA: 01/09/2022  PCP: [name], MD  Admit date: 01/09/2022 Discharge date: 01/13/2022  Time spent: 60 minutes  Recommendations for Outpatient Follow-up:  Follow-up with [name], MD in 2 weeks.  On follow-up patient will need a comprehensive metabolic profile done to follow-up on electrolytes, renal function, LFTs.   Discharge Diagnoses:  Principal Problem:   Alcohol withdrawal syndrome without complication (HCC) Active Problems:   Hypokalemia   Abnormal LFTs   Tobacco dependence due to cigarettes   Schizoaffective disorder, depressive type (HCC)   GERD (gastroesophageal reflux disease)   HTN (hypertension)   Alcoholic cirrhosis of liver with ascites (HCC)   Acute gout   Elevated liver function tests   Nausea and vomiting   Discharge Condition: Stable and improved  Diet recommendation: Heart healthy  Filed Weights   01/09/22 2357 01/11/22 0458 01/12/22 0500  Weight: 99.8 kg 102.7 kg 105.3 kg    History of present illness:  HPI per Dr. Bridgett Larsson 51 year old male history of alcoholic cirrhosis, hypertension, polysubstance abuse, schizoaffective disorder, presents to the ER today with complaints of nausea, vomiting and want to stop drinking.  Last drink was today.  He normally drinks 4 Loco's.  Usually consumes about 5 cans a day.  Also been smoking marijuana.  He has a history of alcoholic cirrhosis.  He continues to abuse alcohol.  Has been having vomiting for 2 days.   On arrival temp 97.7 heart rate 71 blood pressure 136/96 satting 98% on room air   Labs: UDS positive for benzodiazepines and cannabis Sodium 141, potassium 2.9, BUN less than 5, creatinine 0.8   AST of 74, ALT of 82, alk phos of 127, total bili of 0.7   Magnesium 1.5   White count 12.8, hemoglobin 15.6, platelets 328   Ethanol level elevated at 114.   Patient started on CIWA protocol.   [facility] hospitalist contacted  for admission.    ED Course: Noted to be hypokalemic and hypomagnesemic.  Repletion started.  CIWA protocol started. Hospital Course:   Assessment and Plan: * Alcohol withdrawal syndrome without complication ([place]) Admitted under observation to telemetry. Alcohol withdrawal syndrome without complication (HCC) is a Acute illness/condition that poses a threat to life or bodily function.  CIWA protocol. Prn IV ativan 1 mg for seizures.  TOC consult to help with drug rehab placement.   Dr. Bridgett Larsson discussed with the patient that if he does not decide to proceed with intensive alcohol treatment program, then admission to the hospital really serves no purpose as he will likely relapse and start drinking again.  He needs to make a commitment to himself that he is going to stop drinking.  Patient maintained on home regimen of Neurontin 300 mg 3 times daily.   -Patient placed on the Librium detox protocol which she completed during the hospitalization. -Patient maintained on thiamine, folic acid, multivitamin. -Patient also placed on IV Ativan as needed. -TOC consulted to give patient information for alcohol cessation. -Patient will be discharged in stable and improved condition.   Abnormal LFTs Acute.  Secondary to his alcohol use.   -LFTs trended down and was fluctuating.   -Outpatient follow-up.   Hypokalemia Acute.  -Likely secondary to GI losses.  -Repleted.  -Outpatient follow-up.  Alcoholic cirrhosis of liver with ascites (HCC) Chronic.  Patient continues to drink alcohol.  Patient has not decided if he wants to participate in long-term alcohol rehab.  He is at high risk for liver  failure due to continued alcohol abuse.  Continue with lactulose, Aldactone 100 mg daily.  HTN (hypertension) Stable.   -Patient maintained on home regimen Aldactone 100 mg daily.  -It is noted per admitting physician that patient stated no longer on metoprolol.   GERD (gastroesophageal reflux  disease) -Patient was maintained on a PPI.  Schizoaffective disorder, depressive type (HCC) Chronic.  Stable.  -Patient was maintained on home regimen Seroquel 200 mg, Zoloft 25 mg.  Tobacco dependence due to cigarettes Chronic.   -Patient with minimal wheezing on examination. -Patient placed on DuoNebs as needed.   -Tobacco cessation.   -Patient placed on nicotine patch.    Nausea and vomiting - Likely secondary to withdrawal. -Improving. -IV antiemetics, supportive care.  Acute gout - Patient with complaints of acute gouty flare to RN on 01/10/2022. -Patient started on Solu-Medrol 60 mg IV x1 and subsequently placed on a prednisone burst.   -Patient be discharged home on 2-3 more days of prednisone to complete a course.   -Outpatient follow-up.        Procedures: None  Consultations: None  Discharge Exam: Vitals:   01/13/22 0721 01/13/22 1152  BP: (!) 136/97 115/87  Pulse: 64 62  Resp: 19 20  Temp: 97.9 F (36.6 C) 97.7 F (36.5 C)  SpO2: 94% 97%    General: NAD Cardiovascular: Regular rate rhythm no murmurs rubs or gallops.  No JVD.  No lower extremity edema Respiratory: Clear to auscultation bilaterally.  No wheezes, no crackles, no rhonchi.  Discharge Instructions   Discharge Instructions     Diet - low sodium heart healthy   Complete by: As directed    Increase activity slowly   Complete by: As directed       Allergies as of 01/13/2022       Reactions   Bupropion Other (See Comments)   Caused seizures Caused seizures Feel as though I will have a seizure when taking this med        Medication List     STOP taking these medications    chlordiazePOXIDE 25 MG capsule Commonly known as: LIBRIUM   metoprolol tartrate 25 MG tablet Commonly known as: LOPRESSOR   NEOMYCIN-POLYMYXIN-HC (OPHTH) Susp       TAKE these medications    ascorbic acid 250 MG tablet Commonly known as: VITAMIN C Take 1 tablet (250 mg total) by mouth 2  (two) times daily. What changed: when to take this   gabapentin 300 MG capsule Commonly known as: NEURONTIN Take 1 capsule (300 mg total) by mouth 3 (three) times daily.   hydrOXYzine 25 MG tablet Commonly known as: ATARAX Take 1 tablet (25 mg total) by mouth every 6 (six) hours as needed for anxiety (or CIWA score </= 10).   ibuprofen 200 MG tablet Commonly known as: ADVIL Take 600 mg by mouth every 6 (six) hours as needed for headache or moderate pain.   lactulose 10 GM/15ML solution Commonly known as: CHRONULAC Take 15 mLs (10 g total) by mouth 3 (three) times daily. What changed: how much to take   levETIRAcetam 500 MG tablet Commonly known as: KEPPRA Take 1 tablet (500 mg total) by mouth 2 (two) times daily.   MILK THISTLE PO Take 1 capsule by mouth 2 (two) times daily.   multivitamin with minerals Tabs tablet Take 1 tablet by mouth daily.   nicotine 21 mg/24hr patch Commonly known as: NICODERM CQ - dosed in mg/24 hours Place 1 patch (21 mg total) onto the  skin daily. Start taking on: Jan 14, 2022   ondansetron 4 MG disintegrating tablet Commonly known as: ZOFRAN-ODT Take 1 tablet (4 mg total) by mouth every 8 (eight) hours as needed for nausea or vomiting.   pantoprazole 40 MG tablet Commonly known as: Protonix Take 1 tablet (40 mg total) by mouth daily.   predniSONE 20 MG tablet Commonly known as: DELTASONE Take 2 tablets (40 mg total) by mouth daily before breakfast for 2 days. Start taking on: Jan 14, 2022   QUEtiapine 200 MG tablet Commonly known as: SEROquel Take 1 tablet (200 mg total) by mouth at bedtime.   sertraline 25 MG tablet Commonly known as: ZOLOFT Take 1 tablet (25 mg total) by mouth daily.   spironolactone 100 MG tablet Commonly known as: ALDACTONE Take 1 tablet (100 mg total) by mouth daily.   thiamine 100 MG tablet Take 1 tablet (100 mg total) by mouth daily.       Allergies  Allergen Reactions   Bupropion Other (See  Comments)    Caused seizures Caused seizures Feel as though I will have a seizure when taking this med    Follow-up Information     [name], MD. Schedule an appointment as soon as possible for a visit in 2 week(s).   Specialty: Family Medicine Contact information: [address] [place] [address] [phone]                  The results of significant diagnostics from this hospitalization (including imaging, microbiology, ancillary and laboratory) are listed below for reference.    Significant Diagnostic Studies: No results found.  Microbiology: No results found for this or any previous visit (from the past 240 hour(s)).   Labs: Basic Metabolic Panel: Recent Labs  Lab 01/10/22 0104 01/10/22 1021 01/11/22 0134 01/12/22 0551 01/13/22 0159  NA 141 140 136 136 137  K 2.9* 3.7 4.2 4.2 4.7  CL 96* 99 103 103 107  CO2 29 29 25 27 24   GLUCOSE 136* 129* 161* 111* 120*  BUN <5* 5* 8 21* 20  CREATININE 0.85 0.95 0.86 1.17 1.06  CALCIUM 9.6 9.5 9.6 9.1 9.4  MG 1.5* 2.1 2.2 2.1  --    Liver Function Tests: Recent Labs  Lab 01/10/22 0104 01/11/22 0134 01/12/22 0551  AST 74* 48* 62*  ALT 82* 56* 63*  ALKPHOS 127* 102 91  BILITOT 0.7 0.9 0.6  PROT 8.2* 6.6 6.5  ALBUMIN 4.7 3.4* 3.7   No results for input(s): LIPASE, AMYLASE in the last 168 hours. No results for input(s): AMMONIA in the last 168 hours. CBC: Recent Labs  Lab 01/10/22 0104 01/11/22 0134 01/12/22 0551  WBC 12.8* 4.2 9.1  NEUTROABS 10.1*  --   --   HGB 15.6 14.1 13.9  HCT 45.1 41.7 41.4  MCV 97.0 99.5 100.5*  PLT 328 218 200   Cardiac Enzymes: No results for input(s): CKTOTAL, CKMB, CKMBINDEX, TROPONINI in the last 168 hours. BNP: BNP (last 3 results) No results for input(s): BNP in the last 8760 hours.  ProBNP (last 3 results) No results for input(s): PROBNP in the last 8760 hours.  CBG: No results for input(s): GLUCAP in the last 168  hours.     Signed:  Irine Seal MD.  [facility] 01/13/2022, 3:05 PM

## 2022-01-13 NOTE — Social Work (Signed)
MCEDCSW received requested to address SUD consult for Pt prior to d/c. CSW met with Pt at bedside and gave CAGE-AID, Pt scored 3. Pt states that he had been abstinent from alcohol for 1.5 years prior to this return to use. Pt is familiar with local SUD resources as well as 12-step resources and declined CSWs Therapist, sports. Pt does have plan to return to [facility] care as well as to seek OP therapy through [facility]. Pt was able to verbally contract to make those appointments on his own.  Pt has family support system as well as a best friend with whom he speaks daily. Pt is also planning to adopt a senior dog for companionship. Pt is A&O, pleasant and engaged.  CSW provided taxi voucher for d/c transportation as well as a replacement t-shirt from social work Management consultant.  Vergie Living MSW LCSWA Transitions of Care  Clinical Social Worker  [facility]  [phone]

## 2022-01-14 ENCOUNTER — Telehealth: Payer: Self-pay

## 2022-01-14 NOTE — Telephone Encounter (Signed)
Transition Care Management Follow-up Telephone Call Date of discharge and from where: 01/13/2022, [facility] How have you been since you were released from the hospital? He stated that he isn't feeling too bad.  Any questions or concerns? Yes- he said he is having a hard time caring for himself and would like a home health aide. He does not have Medicaid for PCS and I explained that if he qualified for home health services through [facility] the services would be very limited. He would only be eligible for a home health aide if he has a need for skilled nursing and/ or PT.  There is still no guarantee that he would qualify for a home health aide. His monthly income is his disability: $ 851. He said he had Medicaid at one time but is not sure why he lost it. I encouraged him to re-apply   Items Reviewed: Did the pt receive and understand the discharge instructions provided? Yes  Medications obtained and verified? Yes - he said he has all of his medications and did not have any questions about his med regime.  Other? No  Any new allergies since your discharge? No  Dietary orders reviewed? Yes Do you have support at home?  Lives alone. Little support in the area.    Home Care and Equipment/Supplies: Were home health services ordered? no If so, what is the name of the agency? N/a  Has the agency set up a time to come to the patient's home? not applicable Were any new equipment or medical supplies ordered?  No What is the name of the medical supply agency? N/a Were you able to get the supplies/equipment? not applicable Do you have any questions related to the use of the equipment or supplies? No  Functional Questionnaire: (I = Independent and D = Dependent) ADLs: independent   Follow up appointments reviewed:  PCP Hospital f/u appt confirmed? Yes  Scheduled to see Carrolyn Meiers, PA - 01/15/2022.  [facility] f/u appt confirmed?  None scheduled yet   Are transportation arrangements  needed?  He said he may need assistance with transportation  If their condition worsens, is the pt aware to call PCP or go to the Emergency Dept.? Yes Was the patient provided with contact information for the PCP's office or ED? Yes Was to pt encouraged to call back with questions or concerns? Yes

## 2022-01-15 ENCOUNTER — Ambulatory Visit (INDEPENDENT_AMBULATORY_CARE_PROVIDER_SITE_OTHER): Payer: Medicare Other | Admitting: Physician Assistant

## 2022-01-15 VITALS — BP 135/78 | HR 110 | Temp 98.9°F | Resp 18 | Ht 72.0 in | Wt 239.0 lb

## 2022-01-15 DIAGNOSIS — K7031 Alcoholic cirrhosis of liver with ascites: Secondary | ICD-10-CM | POA: Diagnosis not present

## 2022-01-15 DIAGNOSIS — E876 Hypokalemia: Secondary | ICD-10-CM

## 2022-01-15 DIAGNOSIS — F251 Schizoaffective disorder, depressive type: Secondary | ICD-10-CM | POA: Diagnosis not present

## 2022-01-15 DIAGNOSIS — K219 Gastro-esophageal reflux disease without esophagitis: Secondary | ICD-10-CM

## 2022-01-15 DIAGNOSIS — M792 Neuralgia and neuritis, unspecified: Secondary | ICD-10-CM

## 2022-01-15 DIAGNOSIS — K7010 Alcoholic hepatitis without ascites: Secondary | ICD-10-CM

## 2022-01-15 DIAGNOSIS — I10 Essential (primary) hypertension: Secondary | ICD-10-CM

## 2022-01-15 DIAGNOSIS — I8510 Secondary esophageal varices without bleeding: Secondary | ICD-10-CM

## 2022-01-15 MED ORDER — PANTOPRAZOLE SODIUM 40 MG PO TBEC
40.0000 mg | DELAYED_RELEASE_TABLET | Freq: Every day | ORAL | 2 refills | Status: DC
Start: 2022-01-15 — End: 2022-03-24

## 2022-01-15 MED ORDER — SERTRALINE HCL 25 MG PO TABS: 25.0000 mg | ORAL_TABLET | Freq: Every day | ORAL | 1 refills | Status: DC

## 2022-01-15 MED ORDER — QUETIAPINE FUMARATE 200 MG PO TABS: 200.0000 mg | ORAL_TABLET | Freq: Every day | ORAL | 2 refills | Status: DC

## 2022-01-15 MED ORDER — GABAPENTIN 300 MG PO CAPS: 300.0000 mg | ORAL_CAPSULE | Freq: Three times a day (TID) | ORAL | 3 refills | Status: DC

## 2022-01-15 MED ORDER — SPIRONOLACTONE 100 MG PO TABS
100.0000 mg | ORAL_TABLET | Freq: Every day | ORAL | 1 refills | Status: DC
Start: 2022-01-15 — End: 2022-03-24

## 2022-01-15 MED ORDER — HYDROXYZINE HCL 25 MG PO TABS: 25.0000 mg | ORAL_TABLET | Freq: Four times a day (QID) | ORAL | 1 refills | Status: DC | PRN

## 2022-01-15 MED ORDER — LACTULOSE 10 GM/15ML PO SOLN: 30.0000 g | Freq: Three times a day (TID) | ORAL | 2 refills | Status: AC

## 2022-01-15 NOTE — Progress Notes (Unsigned)
Established Patient Office Visit  Subjective   Patient ID: [name], male    DOB: 1970/11/17  Age: 51 y.o. MRN: [id number]  No chief complaint on file.   Admit date: 01/09/2022 Discharge date: 01/13/2022   Time spent: 60 minutes   Recommendations for Outpatient Follow-up:  1. Follow-up with [name], MD in 2 weeks.  On follow-up patient will need a comprehensive metabolic profile done to follow-up on electrolytes, renal function, LFTs.     Discharge Diagnoses:  Principal Problem:   Alcohol withdrawal syndrome without complication (HCC) Active Problems:   Hypokalemia   Abnormal LFTs   Tobacco dependence due to cigarettes   Schizoaffective disorder, depressive type (HCC)   GERD (gastroesophageal reflux disease)   HTN (hypertension)   Alcoholic cirrhosis of liver with ascites (HCC)   Acute gout   Elevated liver function tests   Nausea and vomiting     Discharge Condition: Stable and improved   Diet recommendation: Heart healthy   Filed Weights   01/09/22 2357 01/11/22 0458 01/12/22 0500 Weight: 99.8 kg 102.7 kg 105.3 kg     History of present illness:  HPI per Dr. Bridgett Larsson 51 year old male history of alcoholic cirrhosis, hypertension, polysubstance abuse, schizoaffective disorder, presents to the ER today with complaints of nausea, vomiting and want to stop drinking.  Last drink was today.  He normally drinks 4 Loco's.  Usually consumes about 5 cans a day.  Also been smoking marijuana.  He has a history of alcoholic cirrhosis.  He continues to abuse alcohol.  Has been having vomiting for 2 days.   On arrival temp 97.7 heart rate 71 blood pressure 136/96 satting 98% on room air   Labs: UDS positive for benzodiazepines and cannabis Sodium 141, potassium 2.9, BUN less than 5, creatinine 0.8   AST of 74, ALT of 82, alk phos of 127, total bili of 0.7   Magnesium 1.5   White count 12.8, hemoglobin 15.6, platelets 328   Ethanol level elevated at 114.    Patient started on CIWA protocol.   [facility] hospitalist contacted for admission.    ED Course: Noted to be hypokalemic and hypomagnesemic.  Repletion started.  CIWA protocol started. Hospital Course:    Assessment and Plan: * Alcohol withdrawal syndrome without complication ([place]) Admitted under observation to telemetry. Alcohol withdrawal syndrome without complication (HCC) is a Acute illness/condition that poses a threat to life or bodily function.  CIWA protocol. Prn IV ativan 1 mg for seizures.  TOC consult to help with drug rehab placement.   Dr. Bridgett Larsson discussed with the patient that if he does not decide to proceed with intensive alcohol treatment program, then admission to the hospital really serves no purpose as he will likely relapse and start drinking again.  He needs to make a commitment to himself that he is going to stop drinking.  Patient maintained on home regimen of Neurontin 300 mg 3 times daily.   -Patient placed on the Librium detox protocol which she completed during the hospitalization. -Patient maintained on thiamine, folic acid, multivitamin. -Patient also placed on IV Ativan as needed. -TOC consulted to give patient information for alcohol cessation. -Patient will be discharged in stable and improved condition.     Abnormal LFTs Acute.  Secondary to his alcohol use.   -LFTs trended down and was fluctuating.   -Outpatient follow-up.    Hypokalemia Acute.  -Likely secondary to GI losses.  -Repleted.  -Outpatient follow-up.   Alcoholic cirrhosis of liver with ascites (  HCC) Chronic.  Patient continues to drink alcohol.  Patient has not decided if he wants to participate in long-term alcohol rehab.  He is at high risk for liver failure due to continued alcohol abuse.  Continue with lactulose, Aldactone 100 mg daily.   HTN (hypertension) Stable.   -Patient maintained on home regimen Aldactone 100 mg daily.  -It is noted per admitting physician that patient stated no  longer on metoprolol.    GERD (gastroesophageal reflux disease) -Patient was maintained on a PPI.   Schizoaffective disorder, depressive type (HCC) Chronic.  Stable.  -Patient was maintained on home regimen Seroquel 200 mg, Zoloft 25 mg.   Tobacco dependence due to cigarettes Chronic.   -Patient with minimal wheezing on examination. -Patient placed on DuoNebs as needed.   -Tobacco cessation.   -Patient placed on nicotine patch.     Nausea and vomiting - Likely secondary to withdrawal. -Improving. -IV antiemetics, supportive care.   Acute gout - Patient with complaints of acute gouty flare to RN on 01/10/2022. -Patient started on Solu-Medrol 60 mg IV x1 and subsequently placed on a prednisone burst.   -Patient be discharged home on 2-3 more days of prednisone to complete a course.   -Outpatient follow-up.       Did not fill hydroxyzne due to money  Did not fill prednisone - joints feel good  Lives by himself -    {History (Optional):23778}  ROS    Objective:     There were no vitals taken for this visit. {Vitals History (Optional):23777}  Physical Exam   No results found for any visits on 01/15/22.  {Labs (Optional):23779}  The 10-year ASCVD risk score (Arnett DK, et al., 2019) is: 4.7%    Assessment & Plan:   Problem List Items Addressed This Visit   None   No follow-ups on file.    Loraine Grip Mayers, PA-C

## 2022-01-15 NOTE — Patient Instructions (Signed)
I have sent a refill of your medications to your pharmacy.  I have started a referral for you to be seen by gastroenterology to help manage your cirrhosis.  Please let us know if there is anything else we can do for you  Kennieth Rad, PA-C [facility] Line [email or web address]   Alcoholic Liver Disease Alcoholic liver disease refers to liver damage that is caused by drinking a lot of alcohol over a long period of time. The liver is an organ that: Helps to divide (break down) and absorb fats and other nutrients in the blood. Helps to remove harmful substances from the blood. Makes the parts of the blood that help to form clots and prevent excessive bleeding. Damage may cause the liver to stop working properly. There are three main types of alcoholic liver disease, and they usually occur in the following order: Fatty liver disease. This is considered the early stage of alcoholic liver disease. It is a condition in which too much fat has built up in the liver cells. In many cases, fatty liver disease does not cause any symptoms. It is often diagnosed when lab tests are done for other reasons. Alcoholic hepatitis. This is liver inflammation that decreases the liver's ability to function normally. Alcoholic cirrhosis. Cirrhosis is long-term (chronic) liver injury. Having cirrhosis means that many healthy liver cells have been replaced by scar tissue. This prevents blood from flowing through the liver, which makes it difficult for the liver to function. Symptoms of this condition usually get worse (progress) over time if alcohol use continues. Treatment requires stopping all alcohol intake. What are the causes? Alcoholic liver disease is caused by chronic heavy alcohol use. The liver filters alcohol out of the bloodstream. When alcohol gets broken down in the liver, it releases poisonous (toxic) chemicals that damage liver  cells. What increases the risk? This condition is more likely to develop in: Women. People who are obese. People who have a family history of alcoholic liver disease. People who regularly drink large amounts of alcohol in a short amount of time (binge drink). People who have an underlying liver disease such as hepatitis B or hepatitis C. People with poor nutrition or who lack certain nutrients, such as folate or thiamine (have nutrient deficiencies). What are the signs or symptoms? Most people do not have symptoms in the early stages of this disease. If early symptoms do develop, they may include: Loss of appetite. Nausea and vomiting. Symptoms of moderate disease include: Diarrhea. Fever. Feeling tired (fatigue). Weight loss without trying. Yellowing of the skin and the whites of the eyes (jaundice). Pain and swelling in the abdomen. Symptoms of advanced disease include: Weight loss and muscle loss. Itchy skin. Buildup of fluid in the abdomen (ascites). Swelling of the feet and ankles (edema). Nosebleeds or bleeding gums. Trouble concentrating. Mood changes or agitation. Confusion. Some people do not have symptoms until the condition becomes advanced. Symptoms often get worse right after a period of heavy drinking. How is this diagnosed? This condition may be diagnosed based on: A physical exam. Blood tests. Tests that create detailed images of the body. These may include: Liver ultrasound. CT scan. MRI. A liver biopsy. For this test, a small sample of liver tissue is removed and checked for signs of damage. How is this treated? The most important part of treatment is to stop drinking alcohol. If you are addicted to alcohol, your health care provider will help you make a plan to quit. This  plan may involve: Taking medicine to decrease unpleasant symptoms that are caused by stopping or decreasing alcohol use (withdrawal symptoms). Entering a treatment program to help you  stop drinking. Joining a support group. Treatment for alcoholic liver disease may also include: Nutritional therapy. Your health care provider or a dietitian may recommend: Eating a healthy diet. Taking vitamins. Eating foods that contain a lot of zinc or B vitamins such as folate, thiamine, or pyridoxine. Steroid medicines to reduce liver inflammation. These may be recommended if your disease is advanced. Receiving a donated liver (liver transplant). This is only done in very severe cases and only for people who have completely stopped drinking and can commit to never drinking alcohol again. Follow these instructions at home:  Do not drink alcohol. Follow your treatment plan, and work with your health care provider as needed. Consider joining an alcohol support group. These groups can provide emotional support and guidance. Take over-the-counter and prescription medicines only as told by your health care provider. These include vitamins and supplements. Do not use medicines or eat foods that contain alcohol unless told by your health care provider. Follow instructions from your health care provider or dietitian about eating a healthy diet. Keep all follow-up visits. This is important. Where to find support Alcoholics Anonymous: [employer] Contact a health care provider if: You develop a fever or chills. Your skin color becomes more yellow, pale, or dark. You develop headaches. Get help right away if: You vomit blood. You have black, tarry stools, or bright red blood in your stool. You have trouble: Thinking. Walking. Balancing. Breathing. These symptoms may represent a serious problem that is an emergency. Do not wait to see if the symptoms will go away. Get medical help right away. Call your local emergency services (911 in the [place] Do not drive yourself to the hospital. Summary Alcoholic liver disease refers to liver damage that is caused by drinking a lot of alcohol over a long  period of time. Symptoms of this condition get worse (progress) over time if alcohol use continues. Your health care provider will do a physical exam and tests to diagnose your condition. The most important part of treatment is to stop drinking alcohol. Follow your treatment plan, and work with your health care provider as needed. This information is not intended to replace advice given to you by your health care provider. Make sure you discuss any questions you have with your health care provider. Document Revised: 05/17/2020 Document Reviewed: 05/17/2020 Elsevier Patient Education  [place].

## 2022-01-16 ENCOUNTER — Encounter: Payer: Self-pay | Admitting: Physician Assistant

## 2022-02-02 ENCOUNTER — Other Ambulatory Visit: Payer: Self-pay

## 2022-02-02 ENCOUNTER — Encounter (HOSPITAL_COMMUNITY): Payer: Self-pay

## 2022-02-02 ENCOUNTER — Inpatient Hospital Stay (HOSPITAL_COMMUNITY)
Admission: EM | Admit: 2022-02-02 | Discharge: 2022-02-06 | DRG: 897 | Disposition: A | Discharge status: Home Health | Payer: Medicare Other | Attending: Internal Medicine | Admitting: Internal Medicine

## 2022-02-02 DIAGNOSIS — Z811 Family history of alcohol abuse and dependence: Secondary | ICD-10-CM

## 2022-02-02 DIAGNOSIS — Z8249 Family history of ischemic heart disease and other diseases of the circulatory system: Secondary | ICD-10-CM

## 2022-02-02 DIAGNOSIS — Y906 Blood alcohol level of 120-199 mg/100 ml: Secondary | ICD-10-CM | POA: Diagnosis present

## 2022-02-02 DIAGNOSIS — F17210 Nicotine dependence, cigarettes, uncomplicated: Secondary | ICD-10-CM | POA: Diagnosis present

## 2022-02-02 DIAGNOSIS — F4310 Post-traumatic stress disorder, unspecified: Secondary | ICD-10-CM | POA: Diagnosis present

## 2022-02-02 DIAGNOSIS — K7031 Alcoholic cirrhosis of liver with ascites: Secondary | ICD-10-CM | POA: Diagnosis present

## 2022-02-02 DIAGNOSIS — F10129 Alcohol abuse with intoxication, unspecified: Secondary | ICD-10-CM | POA: Diagnosis present

## 2022-02-02 DIAGNOSIS — F1014 Alcohol abuse with alcohol-induced mood disorder: Secondary | ICD-10-CM | POA: Diagnosis present

## 2022-02-02 DIAGNOSIS — E7220 Disorder of urea cycle metabolism, unspecified: Secondary | ICD-10-CM | POA: Diagnosis present

## 2022-02-02 DIAGNOSIS — G40909 Epilepsy, unspecified, not intractable, without status epilepticus: Secondary | ICD-10-CM | POA: Diagnosis present

## 2022-02-02 DIAGNOSIS — E669 Obesity, unspecified: Secondary | ICD-10-CM | POA: Diagnosis present

## 2022-02-02 DIAGNOSIS — F10930 Alcohol use, unspecified with withdrawal, uncomplicated: Principal | ICD-10-CM

## 2022-02-02 DIAGNOSIS — F1094 Alcohol use, unspecified with alcohol-induced mood disorder: Secondary | ICD-10-CM | POA: Diagnosis present

## 2022-02-02 DIAGNOSIS — F10939 Alcohol use, unspecified with withdrawal, unspecified: Secondary | ICD-10-CM | POA: Diagnosis present

## 2022-02-02 DIAGNOSIS — E876 Hypokalemia: Secondary | ICD-10-CM | POA: Diagnosis present

## 2022-02-02 DIAGNOSIS — I10 Essential (primary) hypertension: Secondary | ICD-10-CM | POA: Diagnosis present

## 2022-02-02 DIAGNOSIS — F10139 Alcohol abuse with withdrawal, unspecified: Secondary | ICD-10-CM | POA: Diagnosis not present

## 2022-02-02 DIAGNOSIS — F10929 Alcohol use, unspecified with intoxication, unspecified: Secondary | ICD-10-CM | POA: Diagnosis present

## 2022-02-02 DIAGNOSIS — M109 Gout, unspecified: Secondary | ICD-10-CM | POA: Diagnosis present

## 2022-02-02 DIAGNOSIS — Z91199 Patient's noncompliance with other medical treatment and regimen due to unspecified reason: Secondary | ICD-10-CM

## 2022-02-02 DIAGNOSIS — Z818 Family history of other mental and behavioral disorders: Secondary | ICD-10-CM

## 2022-02-02 DIAGNOSIS — F250 Schizoaffective disorder, bipolar type: Secondary | ICD-10-CM | POA: Diagnosis present

## 2022-02-02 DIAGNOSIS — K219 Gastro-esophageal reflux disease without esophagitis: Secondary | ICD-10-CM | POA: Diagnosis present

## 2022-02-02 DIAGNOSIS — Z79899 Other long term (current) drug therapy: Secondary | ICD-10-CM

## 2022-02-02 DIAGNOSIS — Z6832 Body mass index (BMI) 32.0-32.9, adult: Secondary | ICD-10-CM

## 2022-02-02 DIAGNOSIS — Z20822 Contact with and (suspected) exposure to covid-19: Secondary | ICD-10-CM | POA: Diagnosis present

## 2022-02-02 LAB — COMPREHENSIVE METABOLIC PANEL
ALT: 32 U/L (ref 0–44)
AST: 47 U/L — ABNORMAL HIGH (ref 15–41)
Albumin: 4.3 g/dL (ref 3.5–5.0)
Alkaline Phosphatase: 143 U/L — ABNORMAL HIGH (ref 38–126)
Anion gap: 16 — ABNORMAL HIGH (ref 5–15)
BUN: 5 mg/dL — ABNORMAL LOW (ref 6–20)
CO2: 27 mmol/L (ref 22–32)
Calcium: 9.2 mg/dL (ref 8.9–10.3)
Chloride: 94 mmol/L — ABNORMAL LOW (ref 98–111)
Creatinine, Ser: 0.76 mg/dL (ref 0.61–1.24)
GFR, Estimated: >60 mL/min (ref >60)
Glucose, Bld: 109 mg/dL — ABNORMAL HIGH (ref 70–99)
Potassium: 3.4 mmol/L — ABNORMAL LOW (ref 3.5–5.1)
Sodium: 137 mmol/L (ref 135–145)
Total Bilirubin: 1.1 mg/dL (ref 0.3–1.2)
Total Protein: 7.7 g/dL (ref 6.5–8.1)

## 2022-02-02 LAB — CBC
HCT: 44.3 % (ref 39.0–52.0)
Hemoglobin: 15.1 g/dL (ref 13.0–17.0)
MCH: 32.8 pg (ref 26.0–34.0)
MCHC: 34.1 g/dL (ref 30.0–36.0)
MCV: 96.3 fL (ref 80.0–100.0)
Platelets: 231 10*3/uL (ref 150–400)
RBC: 4.6 MIL/uL (ref 4.22–5.81)
RDW: 15.4 % (ref 11.5–15.5)
WBC: 5.3 10*3/uL (ref 4.0–10.5)
nRBC: 0 % (ref 0.0–0.2)

## 2022-02-02 LAB — RAPID URINE DRUG SCREEN, HOSP PERFORMED
Amphetamines: NOT DETECTED
Barbiturates: NOT DETECTED
Benzodiazepines: POSITIVE — AB
Cocaine: NOT DETECTED
Opiates: NOT DETECTED
Tetrahydrocannabinol: POSITIVE — AB

## 2022-02-02 LAB — PHOSPHORUS: Phosphorus: 2.7 mg/dL (ref 2.5–4.6)

## 2022-02-02 LAB — RESP PANEL BY RT-PCR (FLU A&B, COVID) ARPGX2
Influenza A by PCR: NEGATIVE
Influenza B by PCR: NEGATIVE
SARS Coronavirus 2 by RT PCR: NEGATIVE

## 2022-02-02 LAB — MAGNESIUM: Magnesium: 2.4 mg/dL (ref 1.7–2.4)

## 2022-02-02 LAB — AMMONIA: Ammonia: 48 umol/L — ABNORMAL HIGH (ref 9–35)

## 2022-02-02 LAB — ETHANOL: Alcohol, Ethyl (B): 115 mg/dL — ABNORMAL HIGH (ref <10)

## 2022-02-02 MED ORDER — LORAZEPAM 1 MG PO TABS
1.0000 mg | ORAL_TABLET | ORAL | Status: AC | PRN
  Administered 2022-02-04 (×2): 3 mg via ORAL
  Administered 2022-02-04: 4 mg via ORAL
  Filled 2022-02-02: qty 3
  Filled 2022-02-02: qty 4
  Filled 2022-02-02: qty 3

## 2022-02-02 MED ORDER — POTASSIUM CHLORIDE IN NACL 40-0.9 MEQ/L-% IV SOLN
INTRAVENOUS | Status: AC
  Filled 2022-02-02: qty 1000

## 2022-02-02 MED ORDER — ONDANSETRON HCL 4 MG/2ML IJ SOLN: 4.0000 mg | Freq: Four times a day (QID) | INTRAMUSCULAR | Status: DC | PRN

## 2022-02-02 MED ORDER — LORAZEPAM 2 MG/ML IJ SOLN
1.0000 mg | INTRAMUSCULAR | Status: AC | PRN
  Administered 2022-02-02: 2 mg via INTRAVENOUS
  Filled 2022-02-02: qty 1

## 2022-02-02 MED ORDER — LORAZEPAM 2 MG/ML IJ SOLN
1.0000 mg | Freq: Once | INTRAMUSCULAR | Status: AC
  Administered 2022-02-02: 1 mg via INTRAVENOUS
  Filled 2022-02-02: qty 1

## 2022-02-02 MED ORDER — CHLORDIAZEPOXIDE HCL 25 MG PO CAPS
25.0000 mg | ORAL_CAPSULE | Freq: Four times a day (QID) | ORAL | Status: DC | PRN
  Administered 2022-02-02: 25 mg via ORAL
  Filled 2022-02-02: qty 1

## 2022-02-02 MED ORDER — NICOTINE 21 MG/24HR TD PT24
21.0000 mg | MEDICATED_PATCH | Freq: Every day | TRANSDERMAL | Status: DC
Start: 2022-02-02 — End: 2022-02-06
  Administered 2022-02-02 – 2022-02-06 (×5): 21 mg via TRANSDERMAL
  Filled 2022-02-02 (×5): qty 1

## 2022-02-02 MED ORDER — LORAZEPAM 1 MG PO TABS
0.0000 mg | ORAL_TABLET | Freq: Three times a day (TID) | ORAL | Status: AC
  Administered 2022-02-04: 3 mg via ORAL
  Administered 2022-02-04 – 2022-02-05 (×2): 2 mg via ORAL
  Administered 2022-02-05 (×2): 1 mg via ORAL
  Administered 2022-02-06: 2 mg via ORAL
  Filled 2022-02-02: qty 2
  Filled 2022-02-02: qty 1
  Filled 2022-02-02 (×2): qty 2
  Filled 2022-02-02: qty 1
  Filled 2022-02-02: qty 3

## 2022-02-02 MED ORDER — LORAZEPAM 2 MG/ML IJ SOLN
2.0000 mg | Freq: Once | INTRAMUSCULAR | Status: AC
  Administered 2022-02-02: 2 mg via INTRAVENOUS
  Filled 2022-02-02: qty 1

## 2022-02-02 MED ORDER — ADULT MULTIVITAMIN W/MINERALS CH
1.0000 | ORAL_TABLET | Freq: Every day | ORAL | Status: DC
  Administered 2022-02-02: 1 via ORAL
  Filled 2022-02-02: qty 1

## 2022-02-02 MED ORDER — THIAMINE HCL 100 MG/ML IJ SOLN: 100.0000 mg | Freq: Every day | INTRAMUSCULAR | Status: DC

## 2022-02-02 MED ORDER — LORAZEPAM 2 MG/ML IJ SOLN: 1.0000 mg | INTRAMUSCULAR | Status: DC | PRN

## 2022-02-02 MED ORDER — PANTOPRAZOLE SODIUM 40 MG PO TBEC
40.0000 mg | DELAYED_RELEASE_TABLET | Freq: Every day | ORAL | Status: DC
  Administered 2022-02-02 – 2022-02-06 (×5): 40 mg via ORAL
  Filled 2022-02-02 (×5): qty 1

## 2022-02-02 MED ORDER — MAGNESIUM SULFATE 2 GM/50ML IV SOLN
2.0000 g | Freq: Once | INTRAVENOUS | Status: AC
  Administered 2022-02-02: 2 g via INTRAVENOUS
  Filled 2022-02-02: qty 50

## 2022-02-02 MED ORDER — NICOTINE 21 MG/24HR TD PT24: 21.0000 mg | MEDICATED_PATCH | Freq: Every day | TRANSDERMAL | Status: DC | PRN

## 2022-02-02 MED ORDER — LORAZEPAM 1 MG PO TABS: 0.0000 mg | ORAL_TABLET | Freq: Two times a day (BID) | ORAL | Status: DC

## 2022-02-02 MED ORDER — METOPROLOL TARTRATE 5 MG/5ML IV SOLN
5.0000 mg | Freq: Three times a day (TID) | INTRAVENOUS | Status: DC
  Administered 2022-02-02 – 2022-02-04 (×5): 5 mg via INTRAVENOUS
  Filled 2022-02-02 (×5): qty 5

## 2022-02-02 MED ORDER — LORAZEPAM 2 MG/ML IJ SOLN: 0.0000 mg | Freq: Two times a day (BID) | INTRAMUSCULAR | Status: DC

## 2022-02-02 MED ORDER — LACTULOSE 10 GM/15ML PO SOLN
30.0000 g | Freq: Three times a day (TID) | ORAL | Status: DC
  Administered 2022-02-02 – 2022-02-06 (×12): 30 g via ORAL
  Filled 2022-02-02 (×12): qty 60

## 2022-02-02 MED ORDER — LEVETIRACETAM 500 MG PO TABS
500.0000 mg | ORAL_TABLET | Freq: Two times a day (BID) | ORAL | Status: DC
Start: 2022-02-02 — End: 2022-02-03
  Administered 2022-02-02 (×2): 500 mg via ORAL
  Filled 2022-02-02 (×2): qty 1

## 2022-02-02 MED ORDER — METOPROLOL TARTRATE 5 MG/5ML IV SOLN
5.0000 mg | Freq: Once | INTRAVENOUS | Status: AC
  Administered 2022-02-02: 5 mg via INTRAVENOUS
  Filled 2022-02-02: qty 5

## 2022-02-02 MED ORDER — LORAZEPAM 1 MG PO TABS: 1.0000 mg | ORAL_TABLET | ORAL | Status: DC | PRN

## 2022-02-02 MED ORDER — FOLIC ACID 1 MG PO TABS
1.0000 mg | ORAL_TABLET | Freq: Every day | ORAL | Status: DC
  Administered 2022-02-02 – 2022-02-06 (×5): 1 mg via ORAL
  Filled 2022-02-02 (×5): qty 1

## 2022-02-02 MED ORDER — GABAPENTIN 300 MG PO CAPS
300.0000 mg | ORAL_CAPSULE | Freq: Three times a day (TID) | ORAL | Status: DC
Start: 2022-02-02 — End: 2022-02-06
  Administered 2022-02-02 – 2022-02-06 (×12): 300 mg via ORAL
  Filled 2022-02-02 (×12): qty 1

## 2022-02-02 MED ORDER — ONDANSETRON 4 MG PO TBDP
4.0000 mg | ORAL_TABLET | Freq: Four times a day (QID) | ORAL | Status: DC | PRN
  Administered 2022-02-02: 4 mg via ORAL
  Filled 2022-02-02: qty 1

## 2022-02-02 MED ORDER — LOPERAMIDE HCL 2 MG PO CAPS: 2.0000 mg | ORAL_CAPSULE | ORAL | Status: AC | PRN

## 2022-02-02 MED ORDER — SERTRALINE HCL 25 MG PO TABS
25.0000 mg | ORAL_TABLET | Freq: Every day | ORAL | Status: DC
  Administered 2022-02-02 – 2022-02-06 (×5): 25 mg via ORAL
  Filled 2022-02-02 (×5): qty 1

## 2022-02-02 MED ORDER — ADULT MULTIVITAMIN W/MINERALS CH
1.0000 | ORAL_TABLET | Freq: Every day | ORAL | Status: DC
  Administered 2022-02-03 – 2022-02-06 (×4): 1 via ORAL
  Filled 2022-02-02 (×4): qty 1

## 2022-02-02 MED ORDER — ONDANSETRON HCL 4 MG PO TABS: 4.0000 mg | ORAL_TABLET | Freq: Four times a day (QID) | ORAL | Status: DC | PRN

## 2022-02-02 MED ORDER — LORAZEPAM 1 MG PO TABS
0.0000 mg | ORAL_TABLET | ORAL | Status: AC
  Administered 2022-02-02: 3 mg via ORAL
  Administered 2022-02-03: 2 mg via ORAL
  Administered 2022-02-03 (×2): 1 mg via ORAL
  Administered 2022-02-03 (×2): 2 mg via ORAL
  Administered 2022-02-04: 4 mg via ORAL
  Administered 2022-02-04: 1 mg via ORAL
  Filled 2022-02-02 (×6): qty 2
  Filled 2022-02-02: qty 3
  Filled 2022-02-02: qty 4
  Filled 2022-02-02: qty 2

## 2022-02-02 MED ORDER — HYDROXYZINE HCL 25 MG PO TABS
25.0000 mg | ORAL_TABLET | Freq: Four times a day (QID) | ORAL | Status: AC | PRN
  Administered 2022-02-02 – 2022-02-05 (×2): 25 mg via ORAL
  Filled 2022-02-02 (×2): qty 1

## 2022-02-02 MED ORDER — LORAZEPAM 2 MG/ML IJ SOLN: 0.0000 mg | Freq: Four times a day (QID) | INTRAMUSCULAR | Status: DC

## 2022-02-02 MED ORDER — SPIRONOLACTONE 25 MG PO TABS
100.0000 mg | ORAL_TABLET | Freq: Every day | ORAL | Status: DC
  Administered 2022-02-02 – 2022-02-06 (×5): 100 mg via ORAL
  Filled 2022-02-02: qty 1
  Filled 2022-02-02 (×4): qty 4

## 2022-02-02 MED ORDER — THIAMINE HCL 100 MG PO TABS
100.0000 mg | ORAL_TABLET | Freq: Every day | ORAL | Status: DC
  Administered 2022-02-02 – 2022-02-06 (×5): 100 mg via ORAL
  Filled 2022-02-02 (×5): qty 1

## 2022-02-02 MED ORDER — LORAZEPAM 1 MG PO TABS: 0.0000 mg | ORAL_TABLET | Freq: Four times a day (QID) | ORAL | Status: DC

## 2022-02-02 NOTE — H&P (Signed)
History and Physical    Patient: [name] VHQ:[id number] DOB: 1971/07/22 DOA: 02/02/2022 DOS: the patient was seen and examined on 02/02/2022 PCP: [name], MD  Patient coming from: Home  Chief Complaint:  Chief Complaint  Patient presents with   Alcohol Problem   HPI: [name] is a 51 y.o. male with medical history significant of alcohol abuse, alcoholic liver cirrhosis, anxiety, PTSD, psychosis, schizoaffective disorder, alcohol withdrawa seizure disorder, GERD, history of symptomatic anemia who is brought to the emergency department due to being depressed, his alcohol level is 115 mg/dL, but despite this the patient is in obvious alcohol withdrawal with moderate tremors and states that he is having some visual hallucinations (bugs on the wall). He denied fever, chills, rhinorrhea, sore throat, wheezing or hemoptysis.  No chest pain, palpitations, diaphoresis, PND, orthopnea or pitting edema of the lower extremities.  No current abdominal pain, nausea, emesis, diarrhea, constipation, melena or hematochezia.  No flank pain, dysuria, frequency or hematuria.  No polyuria, polydipsia, polyphagia or blurred vision.   ED course: Initial vital signs were temperature 98.3 F, pulse 100, respiration 18, BP 134/93 mmHg O2 sat 99% on room air.  Patient was started on CIWA protocol with lorazepam.  Lab work: UDS is positive for benzodiazepines and THC.  Coronavirus influenza PCR was negative.  CBC was normal.  Ammonia level was 48.  Alcohol level 150 mg/dL.  Potassium 3.4, chloride 94 CO2 27 mmol/L with an anion gap of 16 mmol/L.  Glucose 109 mg/dL, AST 47 alkaline phosphatase 143 units/L.   Review of Systems: As mentioned in the history of present illness. All other systems reviewed and are negative. Past Medical History:  Diagnosis Date   Alcohol abuse    Anxiety    at age 61   Blood transfusion without reported diagnosis    Cirrhosis ([place])    Depression    at age 64   GERD  (gastroesophageal reflux disease)    Hypertension    Psychosis ([place])    PTSD (post-traumatic stress disorder)    Schizoaffective disorder    Seizure disorder ([place])    related to etoh seizure   Symptomatic anemia    Past Surgical History:  Procedure Laterality Date   ESOPHAGOGASTRODUODENOSCOPY (EGD) WITH PROPOFOL N/A 05/05/2020   Procedure: ESOPHAGOGASTRODUODENOSCOPY (EGD) WITH PROPOFOL;  Surgeon: [name], MD;  Location: [facility];  Service: Endoscopy;  Laterality: N/A;   FOOT SURGERY     right   HAND SURGERY     Social History:  reports that he has been smoking cigarettes. He has a 45.00 pack-year smoking history. He has never used smokeless tobacco. He reports current alcohol use. He reports current drug use. Frequency: 1.00 time per week. Drug: Marijuana.  Allergies  Allergen Reactions   Bupropion Other (See Comments)    Caused seizures Caused seizures Feel as though I will have a seizure when taking this med    Family History  Problem Relation Age of Onset   Alcoholism Mother    CAD Father    Depression Brother    Colon polyps Neg Hx    Esophageal cancer Neg Hx    Pancreatic cancer Neg Hx    Stomach cancer Neg Hx     Prior to Admission medications   Medication Sig Start Date End Date Taking? Authorizing Provider  gabapentin (NEURONTIN) 300 MG capsule Take 1 capsule (300 mg total) by mouth 3 (three) times daily. 01/15/22  Yes Mayers, Cari S, PA-C  ibuprofen (ADVIL) 200  MG tablet Take 600 mg by mouth every 6 (six) hours as needed for headache or moderate pain.   Yes [provider]  levETIRAcetam (KEPPRA) 500 MG tablet Take 1 tablet (500 mg total) by mouth 2 (two) times daily. 09/23/21  Yes [name], [name], MD  MILK THISTLE PO Take 1 capsule by mouth 2 (two) times daily.   Yes [provider]  Multiple Vitamin (MULTIVITAMIN WITH MINERALS) TABS tablet Take 1 tablet by mouth daily. 01/13/22  Yes Eugenie Filler, MD  pantoprazole (PROTONIX) 40  MG tablet Take 1 tablet (40 mg total) by mouth daily. 01/15/22  Yes Mayers, Cari S, PA-C  QUEtiapine (SEROQUEL) 200 MG tablet Take 1 tablet (200 mg total) by mouth at bedtime. 01/15/22  Yes Mayers, Cari S, PA-C  sertraline (ZOLOFT) 25 MG tablet Take 1 tablet (25 mg total) by mouth daily. 01/15/22  Yes Mayers, Cari S, PA-C  spironolactone (ALDACTONE) 100 MG tablet Take 1 tablet (100 mg total) by mouth daily. 01/15/22  Yes Mayers, Cari S, PA-C  hydrOXYzine (ATARAX) 25 MG tablet Take 1 tablet (25 mg total) by mouth every 6 (six) hours as needed for anxiety (or CIWA score </= 10). Patient not taking: Reported on 02/02/2022 01/15/22   Mayers, Cari S, PA-C  lactulose (CHRONULAC) 10 GM/15ML solution Take 45 mLs (30 g total) by mouth 3 (three) times daily. Patient not taking: Reported on 02/02/2022 01/15/22 02/14/22  Mayers, Cari S, PA-C  nicotine (NICODERM CQ - DOSED IN MG/24 HOURS) 21 mg/24hr patch Place 1 patch (21 mg total) onto the skin daily. Patient not taking: Reported on 02/02/2022 01/14/22   Eugenie Filler, MD  ondansetron (ZOFRAN-ODT) 4 MG disintegrating tablet Take 1 tablet (4 mg total) by mouth every 8 (eight) hours as needed for nausea or vomiting. Patient not taking: Reported on 02/02/2022 01/13/22   Eugenie Filler, MD  thiamine 100 MG tablet Take 1 tablet (100 mg total) by mouth daily. Patient not taking: Reported on 02/02/2022 01/13/22   Eugenie Filler, MD  vitamin C (VITAMIN C) 250 MG tablet Take 1 tablet (250 mg total) by mouth 2 (two) times daily. Patient not taking: Reported on 02/02/2022 08/06/20   [name], MD    Physical Exam: Vitals:   02/02/22 0600 02/02/22 0630 02/02/22 1220 02/02/22 1221  BP: (!) 154/100 (!) 140/92 (!) 144/105 (!) 144/105  Pulse: 83 82 83 83  Resp: 20 (!) 25 18   Temp:      TempSrc:      SpO2: 93% 93% 96%   Weight:      Height:       Physical Exam Vitals reviewed.  Constitutional:      Appearance: He is obese. He is ill-appearing.  HENT:      Head: Normocephalic.     Mouth/Throat:     Mouth: Mucous membranes are moist.  Eyes:     General: No scleral icterus.    Pupils: Pupils are equal, round, and reactive to light.  Neck:     Vascular: No JVD.  Cardiovascular:     Rate and Rhythm: Normal rate and regular rhythm.     Heart sounds: S1 normal and S2 normal.  Pulmonary:     Effort: Pulmonary effort is normal.     Breath sounds: Normal breath sounds.  Abdominal:     General: Bowel sounds are normal. There is no distension.     Palpations: Abdomen is soft.     Tenderness: There is  no abdominal tenderness.  Musculoskeletal:     Cervical back: Neck supple.     Right lower leg: No edema.     Left lower leg: No edema.  Skin:    General: Skin is warm and dry.  Neurological:     General: No focal deficit present.     Mental Status: He is alert and oriented to person, place, and time.     Cranial Nerves: No cranial nerve deficit.     Motor: Tremor present. No seizure activity.  Psychiatric:        Mood and Affect: Mood is anxious.   Data Reviewed:   Results are pending, will review when available.  Assessment and Plan: Principal Problem:   Alcohol withdrawal ([place]) Despite current:   Alcohol intoxication ([place]) Folate, MVI and thiamine. CIWA protocol by lorazepam. Magnesium sulfate supplementation. Consult transitional care team. Behavioral health input appreciated.  Active Problems:   Hypokalemia Replacing. Magnesium was supplemented.    Tobacco dependence due to cigarettes Nicotine replacement therapy as needed.    GERD (gastroesophageal reflux disease) Pantoprazole 40 mg p.o. daily.    HTN (hypertension) Metoprolol 5 mg IVP every 8 hours.    Alcoholic cirrhosis of liver with ascites ([facility]) Alcohol cessation advised.      Hyperammonemia ([facility]) Lactulose 30 g p.o. 3 times daily.    Schizoaffective disorder, bipolar type ([place]) Continue treatment per psychiatry.    Advance Care Planning:   Code  Status: Full Code   Consults: Psychiatry Jerene Pitch Lottie Rater, NP).  Family Communication:   Severity of Illness: The appropriate patient status for this patient is OBSERVATION. Observation status is judged to be reasonable and necessary in order to provide the required intensity of service to ensure the patient's safety. The patient's presenting symptoms, physical exam findings, and initial radiographic and laboratory data in the context of their medical condition is felt to place them at decreased risk for further clinical deterioration. Furthermore, it is anticipated that the patient will be medically stable for discharge from the hospital within 2 midnights of admission.   Author: Reubin Milan, MD 02/02/2022 12:35 PM  For on call review [email or web address].   This document was prepared using Dragon voice recognition software and may contain some unintended transcription errors.

## 2022-02-02 NOTE — ED Triage Notes (Signed)
Patient BIB [facility]. Has been depressed, stemming from cirrhosis of the liver. Wife killed herself in front of him. Having hallucinations. Last drink was 3pm yesterday. Drinks 5 4 Loko's a day. No thoughts of harming himself. History of withdrawal seizures.

## 2022-02-02 NOTE — ED Provider Notes (Addendum)
  Physical Exam  BP (!) 153/93   Pulse 82   Temp 98.3 F (36.8 C) (Oral)   Resp (!) 21   Ht 6' (1.829 m)   Wt 108.4 kg   SpO2 92%   BMI 32.41 kg/m   Physical Exam Vitals reviewed.  Constitutional:      Appearance: Normal appearance.  HENT:     Head: Normocephalic and atraumatic.     Mouth/Throat:     Mouth: Mucous membranes are moist.  Cardiovascular:     Rate and Rhythm: Normal rate.  Pulmonary:     Effort: Pulmonary effort is normal.  Abdominal:     General: Abdomen is flat.  Musculoskeletal:     Cervical back: Normal range of motion and neck supple.  Neurological:     Mental Status: He is alert and oriented to person, place, and time.     Procedures  Procedures  ED Course / MDM    Medical Decision Making Amount and/or Complexity of Data Reviewed Labs: ordered.  Risk OTC drugs. Prescription drug management. Decision regarding hospitalization.   Patient care assumed from [place], [place] at shift change, please see her note for full HPI.  Briefly patient here with depression, alcohol withdrawals and EtOH abuse.  Not hypertensive, not tachycardic, last CIWA score very mild per prior provider.  Given Librium along with Atarax, will need TTS evaluation due to ongoing depression.  Patient evaluated by psychiatric team, they report patient is not able to tolerate any p.o.  Patient did have a couple episodes of nausea and vomiting.  New CIWA score will be obtained.  Patient does have a prior history of alcohol withdrawals including seizures, has been hospitalized in the ICU previously.  Ativan 1 mg has been ordered.  As patient is no longer medically clear he will need admission for further treatment of acute alcohol withdrawals.  Patient was asked about his ongoing alcohol problem, he reports "I drink until I pass out and not remember anything ", then after waking up I drink again and do the exact same.  He does have a prior history of alcohol cirrhosis, reports he  is depressed about this.  12:17 PM spoke to Dr. Olevia Bowens hospitalist service who will admit patient for further management at this time.  Portions of this note were generated with [employer]. Dictation errors may occur despite best attempts at proofreading.     Janeece Fitting, PA-C 02/02/22 1217    Janeece Fitting, PA-C 02/02/22 1533    Margette Fast, MD 02/02/22 2350

## 2022-02-02 NOTE — ED Provider Notes (Signed)
[place] DEPT Provider Note   CSN: [id number] Arrival date & time: 02/02/22  2119     History {Add pertinent medical, surgical, social history, OB history to HPI:1} Chief Complaint  Patient presents with  . Alcohol Problem    [name] is a 51 y.o. male who presents with a cc of etoh abuse and withdrawal sxs. The patient has a pmh of schizoaffective disorder. He drinks heavily and shakes when he does not drink. Last consumption was 15 hours ago. The patient states that he has been having increasing hallucinations and depression. He is not actively suicidal but very depressed and wants to stop drinking. He is concerned he could have a seizure.  He denies HI.   Alcohol Problem      Home Medications Prior to Admission medications   Medication Sig Start Date End Date Taking? Authorizing Provider  gabapentin (NEURONTIN) 300 MG capsule Take 1 capsule (300 mg total) by mouth 3 (three) times daily. 01/15/22  Yes Mayers, Cari S, PA-C  ibuprofen (ADVIL) 200 MG tablet Take 600 mg by mouth every 6 (six) hours as needed for headache or moderate pain.   Yes [provider]  levETIRAcetam (KEPPRA) 500 MG tablet Take 1 tablet (500 mg total) by mouth 2 (two) times daily. 09/23/21  Yes [name], [name], MD  MILK THISTLE PO Take 1 capsule by mouth 2 (two) times daily.   Yes [provider]  Multiple Vitamin (MULTIVITAMIN WITH MINERALS) TABS tablet Take 1 tablet by mouth daily. 01/13/22  Yes Eugenie Filler, MD  pantoprazole (PROTONIX) 40 MG tablet Take 1 tablet (40 mg total) by mouth daily. 01/15/22  Yes Mayers, Cari S, PA-C  QUEtiapine (SEROQUEL) 200 MG tablet Take 1 tablet (200 mg total) by mouth at bedtime. 01/15/22  Yes Mayers, Cari S, PA-C  sertraline (ZOLOFT) 25 MG tablet Take 1 tablet (25 mg total) by mouth daily. 01/15/22  Yes Mayers, Cari S, PA-C  spironolactone (ALDACTONE) 100 MG tablet Take 1 tablet (100 mg total) by mouth daily. 01/15/22   Yes Mayers, Cari S, PA-C  hydrOXYzine (ATARAX) 25 MG tablet Take 1 tablet (25 mg total) by mouth every 6 (six) hours as needed for anxiety (or CIWA score </= 10). Patient not taking: Reported on 02/02/2022 01/15/22   Mayers, Cari S, PA-C  lactulose (CHRONULAC) 10 GM/15ML solution Take 45 mLs (30 g total) by mouth 3 (three) times daily. Patient not taking: Reported on 02/02/2022 01/15/22 02/14/22  Mayers, Cari S, PA-C  nicotine (NICODERM CQ - DOSED IN MG/24 HOURS) 21 mg/24hr patch Place 1 patch (21 mg total) onto the skin daily. Patient not taking: Reported on 02/02/2022 01/14/22   Eugenie Filler, MD  ondansetron (ZOFRAN-ODT) 4 MG disintegrating tablet Take 1 tablet (4 mg total) by mouth every 8 (eight) hours as needed for nausea or vomiting. Patient not taking: Reported on 02/02/2022 01/13/22   Eugenie Filler, MD  thiamine 100 MG tablet Take 1 tablet (100 mg total) by mouth daily. Patient not taking: Reported on 02/02/2022 01/13/22   Eugenie Filler, MD  vitamin C (VITAMIN C) 250 MG tablet Take 1 tablet (250 mg total) by mouth 2 (two) times daily. Patient not taking: Reported on 02/02/2022 08/06/20   [name], MD      Allergies    Bupropion    Review of Systems   Review of Systems  Physical Exam Updated Vital Signs BP (!) 132/114   Pulse 79   Temp 98.3  F (36.8 C) (Oral)   Resp 18   Ht 6' (1.829 m)   Wt 108.4 kg   SpO2 99%   BMI 32.41 kg/m  Physical Exam Vitals and nursing note reviewed.  Constitutional:      General: He is not in acute distress.    Appearance: He is well-developed. He is not diaphoretic.  HENT:     Head: Normocephalic and atraumatic.  Eyes:     General: No scleral icterus.    Conjunctiva/sclera: Conjunctivae normal.  Cardiovascular:     Rate and Rhythm: Normal rate and regular rhythm.     Heart sounds: Normal heart sounds.  Pulmonary:     Effort: Pulmonary effort is normal. No respiratory distress.     Breath sounds: Normal breath sounds.   Abdominal:     Palpations: Abdomen is soft.     Tenderness: There is no abdominal tenderness.  Musculoskeletal:     Cervical back: Normal range of motion and neck supple.  Skin:    General: Skin is warm and dry.  Neurological:     Mental Status: He is alert.  Psychiatric:        Behavior: Behavior normal.    ED Results / Procedures / Treatments   Labs (all labs ordered are listed, but only abnormal results are displayed) Labs Reviewed  RESP PANEL BY RT-PCR (FLU A&B, COVID) ARPGX2  COMPREHENSIVE METABOLIC PANEL  ETHANOL  CBC  RAPID URINE DRUG SCREEN, HOSP PERFORMED  AMMONIA    EKG None  Radiology No results found.  Procedures Procedures  {Document cardiac monitor, telemetry assessment procedure when appropriate:1}  Medications Ordered in ED Medications  thiamine tablet 100 mg (has no administration in time range)    Or  thiamine (B-1) injection 100 mg (has no administration in time range)  multivitamin with minerals tablet 1 tablet (has no administration in time range)  chlordiazePOXIDE (LIBRIUM) capsule 25 mg (25 mg Oral Given 02/02/22 0446)  hydrOXYzine (ATARAX) tablet 25 mg (has no administration in time range)  loperamide (IMODIUM) capsule 2-4 mg (has no administration in time range)  ondansetron (ZOFRAN-ODT) disintegrating tablet 4 mg (4 mg Oral Given 02/02/22 0448)    ED Course/ Medical Decision Making/ A&P                           Medical Decision Making Amount and/or Complexity of Data Reviewed Labs: ordered.  Risk OTC drugs. Prescription drug management.   ***  {Document critical care time when appropriate:1} {Document review of labs and clinical decision tools ie heart score, Chads2Vasc2 etc:1}  {Document your independent review of radiology images, and any outside records:1} {Document your discussion with family members, caretakers, and with consultants:1} {Document social determinants of health affecting pt's care:1} {Document your  decision making why or why not admission, treatments were needed:1} Final Clinical Impression(s) / ED Diagnoses Final diagnoses:  None    Rx / DC Orders ED Discharge Orders     None

## 2022-02-02 NOTE — Consult Note (Signed)
[facility] Face-to-Face Psychiatry Consult   Reason for Consult:  psych consult Referring Physician:  Margarita Mail PA-C Patient Identification: Lamoine Magallon MRN:  [id number] Principal Diagnosis: Alcohol-induced mood disorder ([place]) Diagnosis:  Principal Problem:   Alcohol-induced mood disorder ([place]) Active Problems:   Hypokalemia   Tobacco dependence due to cigarettes   GERD (gastroesophageal reflux disease)   Schizoaffective disorder, bipolar type ([place])   HTN (hypertension)   Alcohol intoxication ([place])   Alcoholic cirrhosis of liver with ascites ([place])   Alcohol withdrawal ([place])   Hyperammonemia ([place])   Total Time spent with patient: 20 minutes  Subjective:   Asriel Westrup is a 51 y.o. male patient admitted with depression.  On assessment pt presents laying in bed; visible sweat, fine tremor. Actively sweating. Reports he is actively feeling bad related to alcohol withdrawal. Endorsing active depression; says he is unable to complete assessment due to the way he is currently feeling. Providers alerted EDP. CIWA orders in place.   Patient admitted to medical unit.   HPI:  Yale Golla is a 51 year old male patient with past psychiatric history of alcohol abuse, withdrawal seizures, depression, alcohol induced mood disorder, PTSD who presented to [facility] via [facility] from home c/o depression he related to his cirrhosis, reported his wife killed herself in front of him and has been experiencing hallucinations. Reported last drink 3 pm previous day. UDS+benzodiazepines, THC; BAL 115. Labs: K 3.4, Cl 94, BUN 5, ALK 143, AST 47, Ammonia 48.   Past Psychiatric History: alcohol abuse, withdrawal seizures, depression, alcohol induced mood disorder, PTSD  Risk to Self:   Risk to Others:   Prior Inpatient Therapy:   Prior Outpatient Therapy:    Past Medical History:  Past Medical History:  Diagnosis Date   Alcohol abuse    Anxiety    at age 89   Blood transfusion without reported  diagnosis    Cirrhosis ([place])    Depression    at age 72   GERD (gastroesophageal reflux disease)    Hypertension    Psychosis ([place])    PTSD (post-traumatic stress disorder)    Schizoaffective disorder    Seizure disorder ([place])    related to etoh seizure   Symptomatic anemia     Past Surgical History:  Procedure Laterality Date   ESOPHAGOGASTRODUODENOSCOPY (EGD) WITH PROPOFOL N/A 05/05/2020   Procedure: ESOPHAGOGASTRODUODENOSCOPY (EGD) WITH PROPOFOL;  Surgeon: [name], MD;  Location: [facility] ENDOSCOPY;  Service: Endoscopy;  Laterality: N/A;   FOOT SURGERY     right   HAND SURGERY     Family History:  Family History  Problem Relation Age of Onset   Alcoholism Mother    CAD Father    Depression Brother    Colon polyps Neg Hx    Esophageal cancer Neg Hx    Pancreatic cancer Neg Hx    Stomach cancer Neg Hx    Family Psychiatric  History: not noted Social History:  Social History   Substance and Sexual Activity  Alcohol Use Yes   Comment: "until I pass out"     Social History   Substance and Sexual Activity  Drug Use Yes   Frequency: 1.0 times per week   Types: Marijuana    Social History   Socioeconomic History   Marital status: Divorced    Spouse name: Not on file   Number of children: Not on file   Years of education: Not on file   Highest education level: Not on file  Occupational  History   Not on file  Tobacco Use   Smoking status: Every Day    Packs/day: 1.50    Years: 30.00    Total pack years: 45.00    Types: Cigarettes   Smokeless tobacco: Never  Vaping Use   Vaping Use: Never used  Substance and Sexual Activity   Alcohol use: Yes    Comment: "until I pass out"   Drug use: Yes    Frequency: 1.0 times per week    Types: Marijuana   Sexual activity: Yes    Birth control/protection: Condom  Other Topics Concern   Not on file  Social History Narrative   Not on file   Social Determinants of Health   Financial Resource Strain: Not on  file  Food Insecurity: Not on file  Transportation Needs: Unmet Transportation Needs (01/13/2022)   PRAPARE - Hydrologist (Medical): Yes    Lack of Transportation (Non-Medical): Yes  Physical Activity: Not on file  Stress: Not on file  Social Connections: Not on file   Additional Social History:    Allergies:   Allergies  Allergen Reactions   Bupropion Other (See Comments)    Caused seizures Caused seizures Feel as though I will have a seizure when taking this med    Labs:  Results for orders placed or performed during the hospital encounter of 02/02/22 (from the past 48 hour(s))  Rapid urine drug screen (hospital performed)     Status: Abnormal   Collection Time: 02/02/22  3:21 AM  Result Value Ref Range   Opiates NONE DETECTED NONE DETECTED   Cocaine NONE DETECTED NONE DETECTED   Benzodiazepines POSITIVE (A) NONE DETECTED   Amphetamines NONE DETECTED NONE DETECTED   Tetrahydrocannabinol POSITIVE (A) NONE DETECTED   Barbiturates NONE DETECTED NONE DETECTED    Comment: (NOTE) DRUG SCREEN FOR MEDICAL PURPOSES ONLY.  IF CONFIRMATION IS NEEDED FOR ANY PURPOSE, NOTIFY LAB WITHIN 5 DAYS.  LOWEST DETECTABLE LIMITS FOR URINE DRUG SCREEN Drug Class                     Cutoff (ng/mL) Amphetamine and metabolites    1000 Barbiturate and metabolites    200 Benzodiazepine                 962 Tricyclics and metabolites     300 Opiates and metabolites        300 Cocaine and metabolites        300 THC                            50 Performed at [facility], [place] [address] [place], [place] [address]   Comprehensive metabolic panel     Status: Abnormal   Collection Time: 02/02/22  4:41 AM  Result Value Ref Range   Sodium 137 135 - 145 mmol/L   Potassium 3.4 (L) 3.5 - 5.1 mmol/L   Chloride 94 (L) 98 - 111 mmol/L   CO2 27 22 - 32 mmol/L   Glucose, Bld 109 (H) 70 - 99 mg/dL    Comment: Glucose reference range applies only to  samples taken after fasting for at least 8 hours.   BUN 5 (L) 6 - 20 mg/dL   Creatinine, Ser 0.76 0.61 - 1.24 mg/dL   Calcium 9.2 8.9 - 10.3 mg/dL   Total Protein 7.7 6.5 - 8.1 g/dL   Albumin 4.3 3.5 -  5.0 g/dL   AST 47 (H) 15 - 41 U/L   ALT 32 0 - 44 U/L   Alkaline Phosphatase 143 (H) 38 - 126 U/L   Total Bilirubin 1.1 0.3 - 1.2 mg/dL   GFR, Estimated >60 >60 mL/min    Comment: (NOTE) Calculated using the CKD-EPI Creatinine Equation (2021)    Anion gap 16 (H) 5 - 15    Comment: Performed at [facility], [place] [address], [place], [place] [address]  Ethanol     Status: Abnormal   Collection Time: 02/02/22  4:41 AM  Result Value Ref Range   Alcohol, Ethyl (B) 115 (H) <10 mg/dL    Comment: (NOTE) Lowest detectable limit for serum alcohol is 10 mg/dL.  For medical purposes only. Performed at [facility], [place] [address] [place], [place] [address]   cbc     Status: None   Collection Time: 02/02/22  4:41 AM  Result Value Ref Range   WBC 5.3 4.0 - 10.5 K/uL   RBC 4.60 4.22 - 5.81 MIL/uL   Hemoglobin 15.1 13.0 - 17.0 g/dL   HCT 44.3 39.0 - 52.0 %   MCV 96.3 80.0 - 100.0 fL   MCH 32.8 26.0 - 34.0 pg   MCHC 34.1 30.0 - 36.0 g/dL   RDW 15.4 11.5 - 15.5 %   Platelets 231 150 - 400 K/uL   nRBC 0.0 0.0 - 0.2 %    Comment: Performed at [facility], [place] [address] [place], [place] [address]  Ammonia     Status: Abnormal   Collection Time: 02/02/22  4:41 AM  Result Value Ref Range   Ammonia 48 (H) 9 - 35 umol/L    Comment: Performed at [facility], [place] [address], [place], [place] [address]  Resp Panel by RT-PCR (Flu A&B, Covid) Anterior Nasal Swab     Status: None   Collection Time: 02/02/22  4:49 AM   Specimen: Anterior Nasal Swab  Result Value Ref Range   SARS Coronavirus 2 by RT PCR NEGATIVE NEGATIVE    Comment: (NOTE) SARS-CoV-2 target nucleic acids are NOT DETECTED.  The SARS-CoV-2 RNA is  generally detectable in upper respiratory specimens during the acute phase of infection. The lowest concentration of SARS-CoV-2 viral copies this assay can detect is 138 copies/mL. A negative result does not preclude SARS-Cov-2 infection and should not be used as the sole basis for treatment or other patient management decisions. A negative result may occur with  improper specimen collection/handling, submission of specimen other than nasopharyngeal swab, presence of viral mutation(s) within the areas targeted by this assay, and inadequate number of viral copies(<138 copies/mL). A negative result must be combined with clinical observations, patient history, and epidemiological information. The expected result is Negative.  Fact Sheet for Patients:  EntrepreneurPulse.com.au  Fact Sheet for Healthcare Providers:  IncredibleEmployment.be  This test is no t yet approved or cleared by the [place] FDA and  has been authorized for detection and/or diagnosis of SARS-CoV-2 by FDA under an Emergency Use Authorization (EUA). This EUA will remain  in effect (meaning this test can be used) for the duration of the COVID-19 declaration under Section 564(b)(1) of the Act, 21 U.S.C.section 360bbb-3(b)(1), unless the authorization is terminated  or revoked sooner.       Influenza A by PCR NEGATIVE NEGATIVE   Influenza B by PCR NEGATIVE NEGATIVE    Comment: (NOTE) The Xpert Xpress SARS-CoV-2/FLU/RSV plus assay is intended  as an aid in the diagnosis of influenza from Nasopharyngeal swab specimens and should not be used as a sole basis for treatment. Nasal washings and aspirates are unacceptable for Xpert Xpress SARS-CoV-2/FLU/RSV testing.  Fact Sheet for Patients: EntrepreneurPulse.com.au  Fact Sheet for Healthcare Providers: IncredibleEmployment.be  This test is not yet approved or cleared by the [place] FDA  and has been authorized for detection and/or diagnosis of SARS-CoV-2 by FDA under an Emergency Use Authorization (EUA). This EUA will remain in effect (meaning this test can be used) for the duration of the COVID-19 declaration under Section 564(b)(1) of the Act, 21 U.S.C. section 360bbb-3(b)(1), unless the authorization is terminated or revoked.  Performed at [facility], [place] [address] [place], [place] [address]     Current Facility-Administered Medications  Medication Dose Route Frequency Provider Last Rate Last Admin   folic acid (FOLVITE) tablet 1 mg  1 mg Oral Daily Leevy-Johnson, Deniese Oberry A, NP   1 mg at 02/02/22 1321   gabapentin (NEURONTIN) capsule 300 mg  300 mg Oral TID Leevy-Johnson, Shakari Qazi A, NP   300 mg at 02/02/22 1639   hydrOXYzine (ATARAX) tablet 25 mg  25 mg Oral Q6H PRN Margarita Mail, PA-C   25 mg at 02/02/22 0553   lactulose (CHRONULAC) 10 GM/15ML solution 30 g  30 g Oral TID Leevy-Johnson, Teyanna Thielman A, NP   30 g at 02/02/22 1639   levETIRAcetam (KEPPRA) tablet 500 mg  500 mg Oral BID Leevy-Johnson, Angelgabriel Willmore A, NP   500 mg at 02/02/22 1053   loperamide (IMODIUM) capsule 2-4 mg  2-4 mg Oral PRN Margarita Mail, PA-C       LORazepam (ATIVAN) tablet 1-4 mg  1-4 mg Oral Q1H PRN Reubin Milan, MD       Or   LORazepam (ATIVAN) injection 1-4 mg  1-4 mg Intravenous Q1H PRN Reubin Milan, MD       LORazepam (ATIVAN) tablet 0-4 mg  0-4 mg Oral Q4H Reubin Milan, MD       Followed by   Derrill Memo ON 02/04/2022] LORazepam (ATIVAN) tablet 0-4 mg  0-4 mg Oral Q8H Reubin Milan, MD       multivitamin with minerals tablet 1 tablet  1 tablet Oral Daily Leevy-Johnson, Taray Normoyle A, NP       nicotine (NICODERM CQ - dosed in mg/24 hours) patch 21 mg  21 mg Transdermal Daily PRN Reubin Milan, MD       nicotine (NICODERM CQ - dosed in mg/24 hours) patch 21 mg  21 mg Transdermal Daily Reubin Milan, MD   21 mg at 02/02/22 1449   ondansetron  (ZOFRAN) tablet 4 mg  4 mg Oral Q6H PRN Reubin Milan, MD       Or   ondansetron [facility]) injection 4 mg  4 mg Intravenous Q6H PRN Reubin Milan, MD       pantoprazole (PROTONIX) EC tablet 40 mg  40 mg Oral Daily Leevy-Johnson, Meghin Thivierge A, NP   40 mg at 02/02/22 1053   sertraline (ZOLOFT) tablet 25 mg  25 mg Oral Daily Leevy-Johnson, Glenisha Gundry A, NP   25 mg at 02/02/22 1053   spironolactone (ALDACTONE) tablet 100 mg  100 mg Oral Daily Leevy-Johnson, Tashiba Timoney A, NP   100 mg at 02/02/22 1054   thiamine tablet 100 mg  100 mg Oral Daily Margarita Mail, PA-C   100 mg at 02/02/22 3382   Or   thiamine (B-1) injection 100 mg  100 mg Intravenous Daily Margarita Mail, PA-C       Current Outpatient Medications  Medication Sig Dispense Refill   gabapentin (NEURONTIN) 300 MG capsule Take 1 capsule (300 mg total) by mouth 3 (three) times daily. 90 capsule 3   ibuprofen (ADVIL) 200 MG tablet Take 600 mg by mouth every 6 (six) hours as needed for headache or moderate pain.     levETIRAcetam (KEPPRA) 500 MG tablet Take 1 tablet (500 mg total) by mouth 2 (two) times daily. 60 tablet 2   MILK THISTLE PO Take 1 capsule by mouth 2 (two) times daily.     Multiple Vitamin (MULTIVITAMIN WITH MINERALS) TABS tablet Take 1 tablet by mouth daily.     pantoprazole (PROTONIX) 40 MG tablet Take 1 tablet (40 mg total) by mouth daily. 30 tablet 2   QUEtiapine (SEROQUEL) 200 MG tablet Take 1 tablet (200 mg total) by mouth at bedtime. 30 tablet 2   sertraline (ZOLOFT) 25 MG tablet Take 1 tablet (25 mg total) by mouth daily. 90 tablet 1   spironolactone (ALDACTONE) 100 MG tablet Take 1 tablet (100 mg total) by mouth daily. 30 tablet 1   hydrOXYzine (ATARAX) 25 MG tablet Take 1 tablet (25 mg total) by mouth every 6 (six) hours as needed for anxiety (or CIWA score </= 10). (Patient not taking: Reported on 02/02/2022) 60 tablet 1   lactulose (CHRONULAC) 10 GM/15ML solution Take 45 mLs (30 g total) by mouth 3 (three) times  daily. (Patient not taking: Reported on 02/02/2022) 4050 mL 2   nicotine (NICODERM CQ - DOSED IN MG/24 HOURS) 21 mg/24hr patch Place 1 patch (21 mg total) onto the skin daily. (Patient not taking: Reported on 02/02/2022) 28 patch 0   ondansetron (ZOFRAN-ODT) 4 MG disintegrating tablet Take 1 tablet (4 mg total) by mouth every 8 (eight) hours as needed for nausea or vomiting. (Patient not taking: Reported on 02/02/2022) 20 tablet 0   thiamine 100 MG tablet Take 1 tablet (100 mg total) by mouth daily. (Patient not taking: Reported on 02/02/2022) 30 tablet 3   vitamin C (VITAMIN C) 250 MG tablet Take 1 tablet (250 mg total) by mouth 2 (two) times daily. (Patient not taking: Reported on 02/02/2022)      Musculoskeletal: Strength & Muscle Tone: within normal limits Gait & Station: normal Patient leans: N/A  Psychiatric Specialty Exam:  Presentation  General Appearance: Appropriate for Environment; Casual  Eye Contact:Fair  Speech:Clear and Coherent; Normal Rate  Speech Volume:Normal  Handedness:Right   Mood and Affect  Mood:Anxious  Affect:Appropriate; Congruent   Thought Process  Thought Processes:Coherent; Linear; Goal Directed  Descriptions of Associations:Intact  Orientation:Full (Time, Place and Person)  Thought Content:Logical  History of Schizophrenia/Schizoaffective disorder:No  Duration of Psychotic Symptoms:No data recorded Hallucinations:No data recorded Ideas of Reference:None  Suicidal Thoughts:No data recorded Homicidal Thoughts:No data recorded  Sensorium  Memory:Immediate Good; Recent Good; Remote Good  Judgment:Fair  Insight:Fair   Executive Functions  Concentration:Fair  Attention Span:Fair  [place]   Psychomotor Activity  Psychomotor Activity:No data recorded  Assets  Assets:Communication Skills; Desire for Improvement; Physical Health; Resilience; Social Support   Sleep  Sleep:No data  recorded  Physical Exam: Physical Exam Vitals and nursing note reviewed.  Constitutional:      General: He is in acute distress.     Appearance: He is ill-appearing and toxic-appearing.  HENT:     Head: Normocephalic.     Nose: Nose normal.  Mouth/Throat:     Mouth: Mucous membranes are moist.     Pharynx: Oropharynx is clear.  Eyes:     Pupils: Pupils are equal, round, and reactive to light.  Cardiovascular:     Rate and Rhythm: Tachycardia present.  Pulmonary:     Effort: Pulmonary effort is normal.  Abdominal:     General: There is distension.  Musculoskeletal:     Cervical back: Normal range of motion.  Skin:    General: Skin is warm.     Comments: Flushed appearance  Neurological:     Mental Status: He is alert.     Motor: Tremor present.  Psychiatric:        Attention and Perception: Attention and perception normal.        Mood and Affect: Mood is anxious and depressed.        Speech: Speech is delayed.        Behavior: Behavior is withdrawn.        Thought Content: Thought content is not paranoid or delusional. Thought content does not include homicidal or suicidal ideation. Thought content does not include homicidal or suicidal plan.        Cognition and Memory: Cognition and memory normal.        Judgment: Judgment is impulsive.    Review of Systems  Constitutional:  Positive for diaphoresis and malaise/fatigue.  Cardiovascular:  Positive for palpitations.  Gastrointestinal:  Positive for abdominal pain, heartburn, nausea and vomiting.  Musculoskeletal:  Positive for myalgias.  Neurological:  Positive for tremors, weakness and headaches.  Psychiatric/Behavioral:  Positive for depression and substance abuse.    Blood pressure (!) 143/94, pulse 93, temperature 98.2 F (36.8 C), temperature source Oral, resp. rate 18, height 6' (1.829 m), weight 108.4 kg, SpO2 91 %. Body mass index is 32.41 kg/m.  Treatment Plan Summary: Daily contact with patient to  assess and evaluate symptoms and progress in treatment, Medication management, and Plan EDP notified in person of patient condition. Patient requires medical clearance at this time; psychiatry to reassess upon clearance. Medical team involved at this time.   Disposition: Patient does not meet criteria for psychiatric inpatient admission. Supportive therapy provided about ongoing stressors.  Inda Merlin, NP 02/02/2022 6:11 PM

## 2022-02-03 DIAGNOSIS — G40909 Epilepsy, unspecified, not intractable, without status epilepticus: Secondary | ICD-10-CM | POA: Diagnosis present

## 2022-02-03 DIAGNOSIS — F10921 Alcohol use, unspecified with intoxication delirium: Secondary | ICD-10-CM | POA: Diagnosis not present

## 2022-02-03 DIAGNOSIS — F1094 Alcohol use, unspecified with alcohol-induced mood disorder: Secondary | ICD-10-CM | POA: Diagnosis not present

## 2022-02-03 DIAGNOSIS — F250 Schizoaffective disorder, bipolar type: Secondary | ICD-10-CM | POA: Diagnosis present

## 2022-02-03 DIAGNOSIS — F10129 Alcohol abuse with intoxication, unspecified: Secondary | ICD-10-CM | POA: Diagnosis present

## 2022-02-03 DIAGNOSIS — Z79899 Other long term (current) drug therapy: Secondary | ICD-10-CM | POA: Diagnosis not present

## 2022-02-03 DIAGNOSIS — K7031 Alcoholic cirrhosis of liver with ascites: Secondary | ICD-10-CM | POA: Diagnosis present

## 2022-02-03 DIAGNOSIS — I10 Essential (primary) hypertension: Secondary | ICD-10-CM | POA: Diagnosis present

## 2022-02-03 DIAGNOSIS — E876 Hypokalemia: Secondary | ICD-10-CM | POA: Diagnosis present

## 2022-02-03 DIAGNOSIS — Z6832 Body mass index (BMI) 32.0-32.9, adult: Secondary | ICD-10-CM | POA: Diagnosis not present

## 2022-02-03 DIAGNOSIS — F10139 Alcohol abuse with withdrawal, unspecified: Secondary | ICD-10-CM | POA: Diagnosis present

## 2022-02-03 DIAGNOSIS — Z811 Family history of alcohol abuse and dependence: Secondary | ICD-10-CM | POA: Diagnosis not present

## 2022-02-03 DIAGNOSIS — Z91199 Patient's noncompliance with other medical treatment and regimen due to unspecified reason: Secondary | ICD-10-CM | POA: Diagnosis not present

## 2022-02-03 DIAGNOSIS — F17210 Nicotine dependence, cigarettes, uncomplicated: Secondary | ICD-10-CM | POA: Diagnosis present

## 2022-02-03 DIAGNOSIS — Z818 Family history of other mental and behavioral disorders: Secondary | ICD-10-CM | POA: Diagnosis not present

## 2022-02-03 DIAGNOSIS — F10929 Alcohol use, unspecified with intoxication, unspecified: Secondary | ICD-10-CM | POA: Diagnosis not present

## 2022-02-03 DIAGNOSIS — F4310 Post-traumatic stress disorder, unspecified: Secondary | ICD-10-CM | POA: Diagnosis present

## 2022-02-03 DIAGNOSIS — E669 Obesity, unspecified: Secondary | ICD-10-CM | POA: Diagnosis present

## 2022-02-03 DIAGNOSIS — Z20822 Contact with and (suspected) exposure to covid-19: Secondary | ICD-10-CM | POA: Diagnosis present

## 2022-02-03 DIAGNOSIS — F10939 Alcohol use, unspecified with withdrawal, unspecified: Secondary | ICD-10-CM | POA: Diagnosis not present

## 2022-02-03 DIAGNOSIS — F1014 Alcohol abuse with alcohol-induced mood disorder: Secondary | ICD-10-CM | POA: Diagnosis present

## 2022-02-03 DIAGNOSIS — K219 Gastro-esophageal reflux disease without esophagitis: Secondary | ICD-10-CM | POA: Diagnosis present

## 2022-02-03 DIAGNOSIS — Y906 Blood alcohol level of 120-199 mg/100 ml: Secondary | ICD-10-CM | POA: Diagnosis present

## 2022-02-03 DIAGNOSIS — Z8249 Family history of ischemic heart disease and other diseases of the circulatory system: Secondary | ICD-10-CM | POA: Diagnosis not present

## 2022-02-03 DIAGNOSIS — M109 Gout, unspecified: Secondary | ICD-10-CM | POA: Diagnosis present

## 2022-02-03 LAB — COMPREHENSIVE METABOLIC PANEL
ALT: 29 U/L (ref 0–44)
AST: 48 U/L — ABNORMAL HIGH (ref 15–41)
Albumin: 4 g/dL (ref 3.5–5.0)
Alkaline Phosphatase: 133 U/L — ABNORMAL HIGH (ref 38–126)
Anion gap: 10 (ref 5–15)
BUN: 12 mg/dL (ref 6–20)
CO2: 32 mmol/L (ref 22–32)
Calcium: 9.3 mg/dL (ref 8.9–10.3)
Chloride: 99 mmol/L (ref 98–111)
Creatinine, Ser: 1.02 mg/dL (ref 0.61–1.24)
GFR, Estimated: >60 mL/min (ref >60)
Glucose, Bld: 121 mg/dL — ABNORMAL HIGH (ref 70–99)
Potassium: 3.9 mmol/L (ref 3.5–5.1)
Sodium: 141 mmol/L (ref 135–145)
Total Bilirubin: 2 mg/dL — ABNORMAL HIGH (ref 0.3–1.2)
Total Protein: 7 g/dL (ref 6.5–8.1)

## 2022-02-03 LAB — CBC
HCT: 43.1 % (ref 39.0–52.0)
Hemoglobin: 14.1 g/dL (ref 13.0–17.0)
MCH: 32.9 pg (ref 26.0–34.0)
MCHC: 32.7 g/dL (ref 30.0–36.0)
MCV: 100.7 fL — ABNORMAL HIGH (ref 80.0–100.0)
Platelets: 185 10*3/uL (ref 150–400)
RBC: 4.28 MIL/uL (ref 4.22–5.81)
RDW: 15.6 % — ABNORMAL HIGH (ref 11.5–15.5)
WBC: 7.1 10*3/uL (ref 4.0–10.5)
nRBC: 0 % (ref 0.0–0.2)

## 2022-02-03 MED ORDER — HYDROXYZINE HCL 25 MG PO TABS: 25.0000 mg | ORAL_TABLET | Freq: Four times a day (QID) | ORAL | Status: DC | PRN

## 2022-02-03 MED ORDER — QUETIAPINE FUMARATE 100 MG PO TABS
100.0000 mg | ORAL_TABLET | Freq: Every day | ORAL | Status: DC
  Administered 2022-02-03 – 2022-02-05 (×3): 100 mg via ORAL
  Filled 2022-02-03 (×3): qty 1

## 2022-02-03 MED ORDER — CHLORDIAZEPOXIDE HCL 25 MG PO CAPS
25.0000 mg | ORAL_CAPSULE | Freq: Four times a day (QID) | ORAL | Status: AC
  Administered 2022-02-03 – 2022-02-04 (×4): 25 mg via ORAL
  Filled 2022-02-03 (×2): qty 1

## 2022-02-03 MED ORDER — CHLORDIAZEPOXIDE HCL 25 MG PO CAPS
25.0000 mg | ORAL_CAPSULE | Freq: Two times a day (BID) | ORAL | Status: AC
  Administered 2022-02-05 – 2022-02-06 (×2): 25 mg via ORAL
  Filled 2022-02-03 (×2): qty 1

## 2022-02-03 MED ORDER — CHLORDIAZEPOXIDE HCL 5 MG PO CAPS
25.0000 mg | ORAL_CAPSULE | Freq: Once | ORAL | Status: AC
  Administered 2022-02-03: 25 mg via ORAL
  Filled 2022-02-03: qty 5

## 2022-02-03 MED ORDER — CHLORDIAZEPOXIDE HCL 25 MG PO CAPS
25.0000 mg | ORAL_CAPSULE | Freq: Three times a day (TID) | ORAL | Status: AC
  Administered 2022-02-04 – 2022-02-05 (×3): 25 mg via ORAL
  Filled 2022-02-03 (×2): qty 1
  Filled 2022-02-03: qty 5
  Filled 2022-02-03 (×2): qty 1

## 2022-02-03 MED ORDER — LEVETIRACETAM IN NACL 500 MG/100ML IV SOLN
500.0000 mg | Freq: Two times a day (BID) | INTRAVENOUS | Status: DC
  Administered 2022-02-03 – 2022-02-06 (×7): 500 mg via INTRAVENOUS
  Filled 2022-02-03 (×7): qty 100

## 2022-02-03 MED ORDER — IBUPROFEN 200 MG PO TABS
600.0000 mg | ORAL_TABLET | Freq: Four times a day (QID) | ORAL | Status: DC | PRN
  Administered 2022-02-03 – 2022-02-04 (×2): 600 mg via ORAL
  Filled 2022-02-03 (×2): qty 3

## 2022-02-03 MED ORDER — CHLORDIAZEPOXIDE HCL 25 MG PO CAPS
25.0000 mg | ORAL_CAPSULE | Freq: Every day | ORAL | Status: DC
  Filled 2022-02-03: qty 1

## 2022-02-03 NOTE — Progress Notes (Signed)
PROGRESS NOTE [name]  TOI:[id number] DOB: 03-16-1971 DOA: 02/02/2022 PCP: [name], MD   Brief Narrative/Hospital Course: 51 y.o.m w/ history significant of alcohol abuse, alcoholic liver cirrhosis, anxiety, PTSD, psychosis, schizoaffective disorder, alcohol withdrawa seizure disorder, GERD, history of symptomatic anemia  Brought to the ED with being depressed, and with tremors and alcohol withdrawal, visual hallucination ED course: Initial vital signs were temperature 98.3 F, pulse 100, respiration 18, BP 134/93 mmHg O2 sat 99% on room air. Labs-UDS is positive for benzodiazepines and THC.  Coronavirus influenza PCR was negative.  CBC was normal.  Ammonia level was 48.  Alcohol level 115 mg/dL.  Potassium 3.4, chloride 94 CO2 27 .  Patient was placed on CIWA scale Ativan and admitted for further management.    Subjective: Seen this am, just back from restroom  Less tremulous and feels some better. Mild nausea , no vomiting " Medicine is helping me" Drinks 4 tallboy "fourloco" daily until he passes out. Threw up pills yesterday   Assessment and Plan: Principal Problem:   Alcohol-induced mood disorder ([place]) Active Problems:   Hypokalemia   Tobacco dependence due to cigarettes   GERD (gastroesophageal reflux disease)   HTN (hypertension)   Alcoholic cirrhosis of liver with ascites (HCC)   Schizoaffective disorder, bipolar type ([place])   Alcohol intoxication ([place])   Hyperbilirubinemia   Alcohol withdrawal ([place])   Hyperammonemia (HCC)   Alcohol abuse with alcohol withdrawal Alcohol intoxication etoh in blood 115: Ciwas 12-13 this  am and on AITVAN prn, appears mildly tremulous alert awake oriented does complains of some Visual hallucination,overall improving- will start on librium taper too as ciwa score is further increasing.Continue with current detox protocol, psychiatry input appreciated.Cont thiamine,folate.Psychiatry following  Alcoholic cirrhosis of liver with  ascites Hyperbilirubinemia Hyperammonemia: Lft stable continue lactulose, monitor lfts.Etoh cessation discussed.  Alcohol-induced mood disorder: Schizoaffective disorder, bipolar type: Psychiatry input appreciated continue Neurontin, Zoloft. Psych following. Denies SI/HI currently. Feels depressed although.  Alcohol withdrawal seizure disorder: Continue Keppra iv, 2/2 vomiting,did not take 4-5 days at home. Hypokalemia-resolved Tobacco dependence due to cigarettes-nicotine patch. GERD-cont ppi HTN:BP stable Medical non-compliance-needs counseling before discharge  Class I Obesity:Patient's Body mass index is 32.41 kg/m. : Will benefit with PCP follow-up, weight loss  healthy lifestyle and outpatient sleep evaluation.  DVT prophylaxis: SCDs Start: 02/02/22 1224 Code Status:   Code Status: Full Code Family Communication: plan of care discussed with patient at bedside. Patient status is: inpatient because of for etoh withdrawl Level of care: Progressive   Dispo: The patient is from: home            Anticipated disposition: TBD  Mobility Assessment (last 72 hours)     Mobility Assessment     Row Name 02/02/22 2255           Does patient have an order for bedrest or is patient medically unstable No - Continue assessment       What is the highest level of mobility based on the progressive mobility assessment? Level 4 (Walks with assist in room) - Balance while marching in place and cannot step forward and back - Complete              Objective: Vitals last 24 hrs: Vitals:   02/02/22 2312 02/03/22 0255 02/03/22 0805 02/03/22 1136  BP: 121/86 114/89 127/90 (!) 141/93  Pulse: 81 74 79 82  Resp: (!) '22 17 16 18  '$ Temp: 97.9 F (36.6 C) 98.2 F (36.8 C) 98.3 F (36.8  C) 98.4 F (36.9 C)  TempSrc:   Oral Oral  SpO2: 94% 94% 94% 95%  Weight:      Height:       Physical Examination: General exam: alert awake,older than stated age, weak appearing. HEENT:Oral mucosa  moist, Ear/Nose WNL grossly, dentition normal. Respiratory system: bilaterally clear BS, no use of accessory muscle Cardiovascular system: S1 & S2 +, No JVD. Gastrointestinal system: Abdomen soft,NT,ND, BS+ Nervous System:Alert, awake, moving extremities and grossly nonfocal Extremities: LE edema neg,distal peripheral pulses palpable.  Skin: No rashes,no icterus. MSK: Normal muscle bulk,tone, power  Medications reviewed:  Scheduled Meds:  chlordiazePOXIDE  25 mg Oral Once   chlordiazePOXIDE  25 mg Oral QID   Followed by   Derrill Memo ON 02/04/2022] chlordiazePOXIDE  25 mg Oral TID   Followed by   Derrill Memo ON 02/05/2022] chlordiazePOXIDE  25 mg Oral BH-qamhs   Followed by   Derrill Memo ON 02/06/2022] chlordiazePOXIDE  25 mg Oral Daily   folic acid  1 mg Oral Daily   gabapentin  300 mg Oral TID   lactulose  30 g Oral TID   LORazepam  0-4 mg Oral Q4H   Followed by   Derrill Memo ON 02/04/2022] LORazepam  0-4 mg Oral Q8H   metoprolol tartrate  5 mg Intravenous Q8H   multivitamin with minerals  1 tablet Oral Daily   nicotine  21 mg Transdermal Daily   pantoprazole  40 mg Oral Daily   sertraline  25 mg Oral Daily   spironolactone  100 mg Oral Daily   thiamine  100 mg Oral Daily   Or   thiamine  100 mg Intravenous Daily  Continuous Infusions:  levETIRAcetam 500 mg (02/03/22 1114)    Diet Order             Diet Heart Room service appropriate? Yes; Fluid consistency: Thin  Diet effective now                  Intake/Output Summary (Last 24 hours) at 02/03/2022 1155 Last data filed at 02/03/2022 0909 Gross per 24 hour  Intake 510 ml  Output --  Net 510 ml  Net IO Since Admission: 510 mL [02/03/22 1155]  Wt Readings from Last 3 Encounters:  02/02/22 108.4 kg  01/15/22 108.4 kg  01/12/22 105.3 kg   Unresulted Labs (From admission, onward)    None     Data Reviewed: I have personally reviewed following labs and imaging studies Recent Labs  Lab 02/02/22 0441 02/03/22 0317  WBC 5.3 7.1   HGB 15.1 14.1  HCT 44.3 43.1  MCV 96.3 100.7*  PLT 231 409   Basic Metabolic Panel: Recent Labs  Lab 02/02/22 0441 02/02/22 2052 02/03/22 0317  NA 137  --  141  K 3.4*  --  3.9  CL 94*  --  99  CO2 27  --  32  GLUCOSE 109*  --  121*  BUN 5*  --  12  CREATININE 0.76  --  1.02  CALCIUM 9.2  --  9.3  MG  --  2.4  --   PHOS  --  2.7  --   GFR: Estimated Creatinine Clearance: 111.4 mL/min (by C-G formula based on SCr of 1.02 mg/dL). Liver Function Tests: Recent Labs  Lab 02/02/22 0441 02/03/22 0317  AST 47* 48*  ALT 32 29  ALKPHOS 143* 133*  BILITOT 1.1 2.0*  PROT 7.7 7.0  ALBUMIN 4.3 4.0   No results for input(s): "LIPASE", "AMYLASE" in  the last 168 hours. Recent Labs  Lab 02/02/22 0441  AMMONIA 48*  Antimicrobials: Anti-infectives (From admission, onward)    None     Culture/Microbiology    Component Value Date/Time   SDES TRACHEAL ASPIRATE 07/21/2020 1332   SPECREQUEST NONE 07/21/2020 1332   CULT  07/21/2020 1332    NO GROWTH 2 DAYS Performed at [facility] [address], [place], [place] [address]    REPTSTATUS 07/23/2020 FINAL 07/21/2020 1332  Radiology Studies: No results found.  LOS: 0 days  Antonieta Pert, MD [facility]  02/03/2022, 11:55 AM

## 2022-02-03 NOTE — Plan of Care (Signed)
  Problem: Clinical Measurements: Goal: Will remain free from infection Outcome: Progressing Goal: Diagnostic test results will improve Outcome: Progressing   Problem: Activity: Goal: Risk for activity intolerance will decrease Outcome: Progressing   Problem: Nutrition: Goal: Adequate nutrition will be maintained Outcome: Progressing   Problem: Safety: Goal: Ability to remain free from injury will improve Outcome: Progressing

## 2022-02-03 NOTE — TOC Initial Note (Signed)
Transition of Care [facility]) - Initial/Assessment Note    Patient Details  Name: [name] MRN: [id number] Date of Birth: 1970/12/11  Transition of Care [facility]) CM/SW Contact:    Leeroy Cha, RN Phone Number: 02/03/2022, 11:15 AM  Clinical Narrative:         Transition of Care [facility]) Screening Note   Patient Details  Name: [name] Date of Birth: [birth year]   [facility]) CM/SW Contact:    Leeroy Cha, RN Phone Number: 02/03/2022, 11:15 AM    Transition of Care [facility] has reviewed patient and no TOC needs have been identified at this time. We will continue to monitor patient advancement through interdisciplinary progression rounds. If new patient transition needs arise, please place a TOC consult.       Expected Discharge Plan: Home/Self Care Barriers to Discharge: Continued Medical Work up   Patient Goals and CMS Choice Patient states their goals for this hospitalization and ongoing recovery are:: to go home CMS Medicare.gov Compare Post Acute Care list provided to:: Patient    Expected Discharge Plan and Services Expected Discharge Plan: Home/Self Care   Discharge Planning Services: CM Consult   Living arrangements for the past 2 months: Single Family Home                                      Prior Living Arrangements/Services Living arrangements for the past 2 months: Single Family Home Lives with:: Self Patient language and need for interpreter reviewed:: Yes Do you feel safe going back to the place where you live?: Yes            Criminal Activity/Legal Involvement Pertinent to Current Situation/Hospitalization: No - Comment as needed  Activities of Daily Living Home Assistive Devices/Equipment: Eyeglasses ADL Screening (condition at time of admission) Patient's cognitive ability adequate to safely complete daily activities?: Yes Is the patient deaf or have difficulty hearing?: No Does the patient  have difficulty seeing, even when wearing glasses/contacts?: No Does the patient have difficulty concentrating, remembering, or making decisions?: No Patient able to express need for assistance with ADLs?: Yes Does the patient have difficulty dressing or bathing?: No Independently performs ADLs?: Yes (appropriate for developmental age) Does the patient have difficulty walking or climbing stairs?: No Weakness of Legs: None Weakness of Arms/Hands: None  Permission Sought/Granted                  Emotional Assessment Appearance:: Appears stated age     Orientation: : Oriented to Self, Oriented to Place, Oriented to  Time, Oriented to Situation Alcohol / Substance Use: Alcohol Use Psych Involvement: No (comment)  Admission diagnosis:  Alcohol withdrawal ([place]) [F10.939] Alcohol withdrawal syndrome without complication ([place]) [J88.416] Patient Active Problem List   Diagnosis Date Noted   Alcohol withdrawal ([place]) 02/02/2022   Hyperammonemia ([place]) 02/02/2022   Alcohol withdrawal syndrome without complication ([place]) 60/63/0160   Acute gout 01/10/2022   Elevated liver function tests    Nausea and vomiting    Trigger finger, right middle finger 10/93/2355   Alcoholic cirrhosis of liver with ascites ([place]) 03/08/2021   Polysubstance abuse ([place]) 03/08/2021   Secondary esophageal varices without bleeding ([place]) 03/08/2021   Macrocytic anemia    Hepatic steatosis    Hypomagnesemia 05/03/2020   Hyperbilirubinemia 05/03/2020   Alcoholism ([place]) 73/22/0254   Alcoholic hepatitis without ascites 03/06/2019   HTN (hypertension) 01/10/2019  Alcohol intoxication ([place]) 01/10/2019   Schizoaffective disorder, bipolar type ([place]) 06/02/2018   Alcohol-induced mood disorder ([place]) 02/22/2016   Gout 10/22/2015   GERD (gastroesophageal reflux disease) 10/22/2015   Schizoaffective disorder, depressive type ([place])    History of alcohol abuse    Tobacco dependence due to cigarettes 11/20/2014   Abnormal  LFTs    Alcohol abuse 02/09/2014   Post traumatic stress disorder (PTSD) 11/03/2011   Hypokalemia 07/02/2011   PCP:  [name], MD Pharmacy:   [facility], [address] [place] [address] Phone: [phone] Fax: [phone]  [id number] [id number]: [phone]: [phone]    Social Determinants of Health (SDOH) Interventions    Readmission Risk Interventions    09/19/2021   12:21 PM  Readmission Risk Prevention Plan  HRI or Home Care Consult Complete  Social Work Consult for RKermitPlanning/Counseling Complete  Palliative Care Screening Not Applicable

## 2022-02-03 NOTE — Hospital Course (Addendum)
51 y.o.m w/ history significant of alcohol abuse, alcoholic liver cirrhosis, anxiety, PTSD, psychosis, schizoaffective disorder, alcohol withdrawa seizure disorder, GERD, history of symptomatic anemia  Brought to the ED with being depressed, and with tremors and alcohol withdrawal, visual hallucination ED course: Initial vital signs were temperature 98.3 F, pulse 100, respiration 18, BP 134/93 mmHg O2 sat 99% on room air. Labs-UDS is positive for benzodiazepines and THC.  Coronavirus influenza PCR was negative.  CBC was normal.  Ammonia level was 48.  Alcohol level 115 mg/dL.  Potassium 3.4, chloride 94 CO2 27 .  Patient was placed on CIWA scale Ativan and admitted for further management.

## 2022-02-04 DIAGNOSIS — F1094 Alcohol use, unspecified with alcohol-induced mood disorder: Secondary | ICD-10-CM | POA: Diagnosis not present

## 2022-02-04 MED ORDER — ENOXAPARIN SODIUM 40 MG/0.4ML IJ SOSY
40.0000 mg | PREFILLED_SYRINGE | INTRAMUSCULAR | Status: DC
  Administered 2022-02-04 – 2022-02-06 (×3): 40 mg via SUBCUTANEOUS
  Filled 2022-02-04 (×3): qty 0.4

## 2022-02-04 MED ORDER — COLCHICINE 0.6 MG PO TABS
0.6000 mg | ORAL_TABLET | Freq: Once | ORAL | Status: AC
  Administered 2022-02-04: 0.6 mg via ORAL
  Filled 2022-02-04: qty 1

## 2022-02-04 MED ORDER — HYDROCODONE-ACETAMINOPHEN 5-325 MG PO TABS
1.0000 | ORAL_TABLET | Freq: Three times a day (TID) | ORAL | Status: DC | PRN
  Administered 2022-02-04 – 2022-02-06 (×4): 1 via ORAL
  Filled 2022-02-04 (×4): qty 1

## 2022-02-04 NOTE — Plan of Care (Signed)
  Problem: Clinical Measurements: Goal: Will remain free from infection Outcome: Progressing   Problem: Nutrition: Goal: Adequate nutrition will be maintained Outcome: Progressing   

## 2022-02-04 NOTE — Plan of Care (Signed)
?  Problem: Clinical Measurements: ?Goal: Will remain free from infection ?Outcome: Progressing ?Goal: Diagnostic test results will improve ?Outcome: Progressing ?Goal: Respiratory complications will improve ?Outcome: Progressing ?  ?

## 2022-02-04 NOTE — Progress Notes (Signed)
Bedside report received from RN.  Patient assessed, CIWA performed, Ativan administered.  Angie Fava, RN

## 2022-02-04 NOTE — Progress Notes (Signed)
   02/04/22 1451  CIWA-Ar  BP 121/87  Pulse Rate 84  Nausea and Vomiting 3  Tactile Disturbances 3  Tremor 4  Auditory Disturbances 0  Paroxysmal Sweats 1  Visual Disturbances 2  Anxiety 5  Headache, Fullness in Head 4  Agitation 0  Orientation and Clouding of Sensorium 0  CIWA-Ar Total 22   MD (Ramesh Kc) and RR RN Josph Macho, RN) notified.  No new orders.  Angie Fava, RN

## 2022-02-04 NOTE — Progress Notes (Signed)
PROGRESS NOTE [name]  VZD:[id number] DOB: [birth year] DOA: 02/02/2022 PCP: [name], MD   Brief Narrative/Hospital Course: 51 y.o.m w/ history significant of alcohol abuse, alcoholic liver cirrhosis, anxiety, PTSD, psychosis, schizoaffective disorder, alcohol withdrawa seizure disorder, GERD, history of symptomatic anemia  Brought to the ED with being depressed, and with tremors and alcohol withdrawal, visual hallucination ED course: Initial vital signs were temperature 98.3 F, pulse 100, respiration 18, BP 134/93 mmHg O2 sat 99% on room air. Labs-UDS is positive for benzodiazepines and THC.  Coronavirus influenza PCR was negative.  CBC was normal.  Ammonia level was 48.  Alcohol level 115 mg/dL.  Potassium 3.4, chloride 94 CO2 27 .  Patient was placed on CIWA scale Ativan and admitted for further management-given high CIWA score- added Librium taper 6/19.    Subjective: Seen and examined this morning. Patient is resting comfortably Still tremulous complains of pain on the sole-reports she has gout,  but no redness tenderness on the foot- on prn ibuprofen. Overnight afebrile  CIWA score 11>2>2 seen this am, just back from restroom Patient reports he drinks 4 tallboy "fourloco" daily until he passes out.   Assessment and Plan: Principal Problem:   Alcohol-induced mood disorder (HCC) Active Problems:   Hypokalemia   Tobacco dependence due to cigarettes   GERD (gastroesophageal reflux disease)   HTN (hypertension)   Alcoholic cirrhosis of liver with ascites (HCC)   Schizoaffective disorder, bipolar type ([place])   Alcohol intoxication ([place])   Hyperbilirubinemia   Alcohol withdrawal ([place])   Hyperammonemia (HCC)   Alcohol abuse with alcohol withdrawal Alcohol intoxication etoh in blood 115: Overall improving, we will continue with w/ Librium taper, prn ativan per CIWA Score.psychiatry input appreciated.Cont thiamine,folate.extensively discussed need to continue to stay away  from alcohol.  Alcoholic cirrhosis of liver with ascites Hyperbilirubinemia Hyperammonemia: Mentating well, LFTs stable with slightly elevated total bilirubin.  Alcohol cessation discussed.  Continue lactulose.    Alcohol-induced mood disorder: Schizoaffective disorder, bipolar type: Psychiatry input appreciated continue Neurontin, Zoloft, Seroquel. Psych following. Denies SI/HI currently. Feels depressed although.  Alcohol withdrawal seizure disorder: cont Keppra iv for now> changed to p.o. once tolerating well,did not take keppra for 4-5 days at home. Hypokalemia-resolved Tobacco dependence due to cigarettes-nicotine patch. GERD-cont ppi HTN: BP stable/soft, DC IV Lopressor.  Continue Aldactone Medical non-compliance-needs counseling before discharge Class I Obesity:Patient's Body mass index is 32.41 kg/m. : Will benefit with PCP follow-up, weight loss  healthy lifestyle and outpatient sleep evaluation.  DVT prophylaxis: enoxaparin (LOVENOX) injection 40 mg Start: 02/04/22 1000 SCDs Start: 02/02/22 1224 Code Status:   Code Status: Full Code Family Communication: plan of care discussed with patient at bedside. Patient status is: inpatient because of for etoh withdrawl Level of care: Progressive   Dispo: The patient is from: home            Anticipated disposition: Encourage PT evaluation, hopefully discharge in 1-2 days days once detox completed  Mobility Assessment (last 72 hours)     Mobility Assessment     Row Name 02/04/22 0755 02/02/22 2255         Does patient have an order for bedrest or is patient medically unstable No - Continue assessment No - Continue assessment      What is the highest level of mobility based on the progressive mobility assessment? Level 5 (Walks with assist in room/hall) - Balance while stepping forward/back and can walk in room with assist - Complete Level 4 (Walks with assist in  room) - Balance while marching in place and cannot step forward and  back - Complete             Objective: Vitals last 24 hrs: Vitals:   02/03/22 1456 02/03/22 1955 02/03/22 2212 02/04/22 0659  BP: 133/89 121/87 (!) 127/99 118/82  Pulse: 78 78 71 70  Resp: '20 16 16 16  '$ Temp: 98.1 F (36.7 C) 97.6 F (36.4 C) 97.6 F (36.4 C) 97.7 F (36.5 C)  TempSrc: Oral Oral Oral Oral  SpO2: 97% 94% 96% 97%  Weight:      Height:       Physical Examination: General exam: AAOX3, older than stated age, weak appearing. HEENT:Oral mucosa moist, Ear/Nose WNL grossly, dentition normal. Respiratory system: bilaterally CLEAR, no use of accessory muscle Cardiovascular system: S1 & S2 +, No JVD,. Gastrointestinal system: Abdomen soft,NT,ND,BS+ Nervous System:Alert, awake, moving extremities and grossly nonfocal Extremities: LE ankle edema neg, distal peripheral pulses palpable.  Skin: No rashes,no icterus. MSK: Normal muscle bulk,tone, power   Medications reviewed:  Scheduled Meds:  chlordiazePOXIDE  25 mg Oral TID   Followed by   Derrill Memo ON 02/05/2022] chlordiazePOXIDE  25 mg Oral BID   Followed by   Derrill Memo ON 02/07/2022] chlordiazePOXIDE  25 mg Oral Daily   enoxaparin (LOVENOX) injection  40 mg Subcutaneous P50D   folic acid  1 mg Oral Daily   gabapentin  300 mg Oral TID   lactulose  30 g Oral TID   LORazepam  0-4 mg Oral Q4H   Followed by   LORazepam  0-4 mg Oral Q8H   multivitamin with minerals  1 tablet Oral Daily   nicotine  21 mg Transdermal Daily   pantoprazole  40 mg Oral Daily   QUEtiapine  100 mg Oral QHS   sertraline  25 mg Oral Daily   spironolactone  100 mg Oral Daily   thiamine  100 mg Oral Daily   Or   thiamine  100 mg Intravenous Daily  Continuous Infusions:  levETIRAcetam 500 mg (02/03/22 2347)    Diet Order             Diet Heart Room service appropriate? Yes; Fluid consistency: Thin  Diet effective now                  Intake/Output Summary (Last 24 hours) at 02/04/2022 1006 Last data filed at 02/04/2022 0600 Gross per  24 hour  Intake 547 ml  Output 301 ml  Net 246 ml  Net IO Since Admission: 756 mL [02/04/22 1006]  Wt Readings from Last 3 Encounters:  02/02/22 108.4 kg  01/15/22 108.4 kg  01/12/22 105.3 kg   Unresulted Labs (From admission, onward)    None     Data Reviewed: I have personally reviewed following labs and imaging studies Recent Labs  Lab 02/02/22 0441 02/03/22 0317  WBC 5.3 7.1  HGB 15.1 14.1  HCT 44.3 43.1  MCV 96.3 100.7*  PLT 231 326   Basic Metabolic Panel: Recent Labs  Lab 02/02/22 0441 02/02/22 2052 02/03/22 0317  NA 137  --  141  K 3.4*  --  3.9  CL 94*  --  99  CO2 27  --  32  GLUCOSE 109*  --  121*  BUN 5*  --  12  CREATININE 0.76  --  1.02  CALCIUM 9.2  --  9.3  MG  --  2.4  --   PHOS  --  2.7  --  GFR: Estimated Creatinine Clearance: 111.4 mL/min (by C-G formula based on SCr of 1.02 mg/dL). Liver Function Tests: Recent Labs  Lab 02/02/22 0441 02/03/22 0317  AST 47* 48*  ALT 32 29  ALKPHOS 143* 133*  BILITOT 1.1 2.0*  PROT 7.7 7.0  ALBUMIN 4.3 4.0   No results for input(s): "LIPASE", "AMYLASE" in the last 168 hours. Recent Labs  Lab 02/02/22 0441  AMMONIA 48*  Antimicrobials: Anti-infectives (From admission, onward)    None     Culture/Microbiology    Component Value Date/Time   SDES TRACHEAL ASPIRATE 07/21/2020 1332   SPECREQUEST NONE 07/21/2020 1332   CULT  07/21/2020 1332    NO GROWTH 2 DAYS Performed at [facility] [address] [place], [place] [address]    REPTSTATUS 07/23/2020 FINAL 07/21/2020 1332  Radiology Studies: No results found.  LOS: 1 day  Antonieta Pert, MD [facility]  02/04/2022, 10:06 AM

## 2022-02-04 NOTE — Evaluation (Signed)
Physical Therapy Evaluation Patient Details Name: [name] MRN: [id number] DOB: [birth year] Today's Date: 02/04/2022  History of Present Illness  51 y.o.m w/ history significant of alcohol abuse, alcoholic liver cirrhosis, anxiety, PTSD, psychosis, schizoaffective disorder, alcohol withdrawa seizure disorder, GERD, history of symptomatic anemia   Brought to the ED with being depressed, and with tremors and alcohol withdrawal, visual hallucination  Clinical Impression  Pt admitted with above diagnosis. Pt ambulated 59' with RW, distance limited by L foot pain, which pt attributes to gout. Pt reports his RW at home is broken, he would benefit from a new one. From a mobility standpoint, I expect he'll be able to DC home.  Pt currently with functional limitations due to the deficits listed below (see PT Problem List). Pt will benefit from skilled PT to increase their independence and safety with mobility to allow discharge to the venue listed below.          Recommendations for follow up therapy are one component of a multi-disciplinary discharge planning process, led by the attending physician.  Recommendations may be updated based on patient status, additional functional criteria and insurance authorization.  Follow Up Recommendations Home health PT    Assistance Recommended at Discharge Set up Supervision/Assistance  Patient can return home with the following  Assistance with cooking/housework;Assist for transportation;Help with stairs or ramp for entrance    Equipment Recommendations Rolling walker (2 wheels)  Recommendations for Other Services       Functional Status Assessment Patient has had a recent decline in their functional status and demonstrates the ability to make significant improvements in function in a reasonable and predictable amount of time.     Precautions / Restrictions Precautions Precautions: Fall Restrictions Weight Bearing Restrictions: No      Mobility   Bed Mobility Overal bed mobility: Independent                  Transfers Overall transfer level: Needs assistance Equipment used: Rolling walker (2 wheels) Transfers: Sit to/from Stand Sit to Stand: Supervision           General transfer comment: supervision for safety    Ambulation/Gait Ambulation/Gait assistance: Supervision Gait Distance (Feet): 60 Feet Assistive device: Rolling walker (2 wheels) Gait Pattern/deviations: Step-through pattern, Wide base of support, Decreased stride length Gait velocity: Decreased     General Gait Details: distance limited by L foot pain (2* gout per pt), no loss of balance  Stairs            Wheelchair Mobility    Modified Rankin (Stroke Patients Only)       Balance Overall balance assessment: Mild deficits observed, not formally tested                                           Pertinent Vitals/Pain Pain Assessment Pain Assessment: 0-10 Pain Score: 7  Pain Location: L foot (2* gout per pt) Pain Descriptors / Indicators: Sore Pain Intervention(s): Limited activity within patient's tolerance, Monitored during session, Premedicated before session    Home Living Family/patient expects to be discharged to:: Private residence Living Arrangements: Alone Available Help at Discharge: Family;Available PRN/intermittently Type of Home: House Home Access: Stairs to enter Entrance Stairs-Rails: Psychiatric nurse of Steps: 2   Home Layout: One level Home Equipment: Shower seat Additional Comments: pt has a RW but its broken    Prior Function  Prior Level of Function : Independent/Modified Independent             Mobility Comments: Typically independent without DME; walks 2-3 blocks to grocery store and brings home items in backpack; primarily sedentary, enjoys computer games ADLs Comments: sits to shower due to balance issues     Hand Dominance        Extremity/Trunk  Assessment   Upper Extremity Assessment Upper Extremity Assessment: Overall WFL for tasks assessed    Lower Extremity Assessment RLE Deficits / Details: functionally >4/5 strength throughout RLE Sensation: history of peripheral neuropathy LLE Deficits / Details: functionally >4/5 strength throughout LLE Sensation: history of peripheral neuropathy    Cervical / Trunk Assessment Cervical / Trunk Assessment: Normal  Communication   Communication: No difficulties  Cognition Arousal/Alertness: Awake/alert Behavior During Therapy: Flat affect Overall Cognitive Status: Within Functional Limits for tasks assessed                                          General Comments General comments (skin integrity, edema, etc.): B foot neuropathy    Exercises     Assessment/Plan    PT Assessment Patient needs continued PT services  PT Problem List Decreased mobility;Decreased activity tolerance;Impaired sensation;Pain       PT Treatment Interventions Therapeutic activities;Therapeutic exercise;Patient/family education;Gait training;Functional mobility training    PT Goals (Current goals can be found in the Care Plan section)  Acute Rehab PT Goals Patient Stated Goal: decrease pain, get some help at home PT Goal Formulation: With patient Time For Goal Achievement: 02/18/22 Potential to Achieve Goals: Good    Frequency Min 3X/week     Co-evaluation               AM-PAC PT "6 Clicks" Mobility  Outcome Measure Help needed turning from your back to your side while in a flat bed without using bedrails?: None Help needed moving from lying on your back to sitting on the side of a flat bed without using bedrails?: None Help needed moving to and from a bed to a chair (including a wheelchair)?: A Little Help needed standing up from a chair using your arms (e.g., wheelchair or bedside chair)?: None Help needed to walk in hospital room?: A Little Help needed climbing  3-5 steps with a railing? : A Little 6 Click Score: 21    End of Session Equipment Utilized During Treatment: Gait belt Activity Tolerance: Patient limited by pain Patient left: in chair;with chair alarm set;with call bell/phone within reach Nurse Communication: Mobility status PT Visit Diagnosis: Difficulty in walking, not elsewhere classified (R26.2);Pain Pain - Right/Left: Left Pain - part of body: Ankle and joints of foot    Time: 1453-1510 PT Time Calculation (min) (ACUTE ONLY): 17 min   Charges:   PT Evaluation $PT Eval Moderate Complexity: 1 Mod         Philomena Doheny PT 02/04/2022  Acute Rehabilitation Services  Office [phone]

## 2022-02-04 NOTE — TOC Progression Note (Signed)
Transition of Care [facility]) - Progression Note    Patient Details  Name: [name] MRN: [id number] Date of Birth: 03-15-1971  Transition of Care [facility]) CM/SW Contact  Leeroy Cha, RN Phone Number: 02/04/2022, 1:49 PM  Clinical Narrative:    Spoke at length with patient about home life and habits.  States that he does better when he is living with some like his mother.  She has came in the past and stayed for several months at a time.  [place].  Recently mother had a cva not sure if she will be able to return to [place].  Care that he is describing is custodial in nature.  Explained to him that this type of care is self pay and private.  He would have to hire.  States that he has fallen in the past and is afraid to be alone.  Would benefit from a p.t. eval/will ask md to order/.  Substance abuse resources given to patient and a brief review of inpatient versus outpt. Reviewed.  States he understands.  Is expecting his family to come in in the next several days for a family meeting.  Asked if there someone in the family that he could live with and the answer was yes.  Will awaiting pt eval and family meeting for determination.    Expected Discharge Plan: Home/Self Care Barriers to Discharge: Continued Medical Work up  Expected Discharge Plan and Services Expected Discharge Plan: Home/Self Care   Discharge Planning Services: CM Consult   Living arrangements for the past 2 months: Single Family Home                                       Social Determinants of Health (SDOH) Interventions    Readmission Risk Interventions    09/19/2021   12:21 PM  Readmission Risk Prevention Plan  HRI or Home Care Consult Complete  Social Work Consult for [place] Planning/Counseling Complete  Palliative Care Screening Not Applicable

## 2022-02-05 DIAGNOSIS — F10921 Alcohol use, unspecified with intoxication delirium: Secondary | ICD-10-CM | POA: Diagnosis not present

## 2022-02-05 DIAGNOSIS — K7031 Alcoholic cirrhosis of liver with ascites: Secondary | ICD-10-CM | POA: Diagnosis not present

## 2022-02-05 DIAGNOSIS — K219 Gastro-esophageal reflux disease without esophagitis: Secondary | ICD-10-CM

## 2022-02-05 DIAGNOSIS — F1094 Alcohol use, unspecified with alcohol-induced mood disorder: Secondary | ICD-10-CM | POA: Diagnosis not present

## 2022-02-05 DIAGNOSIS — F250 Schizoaffective disorder, bipolar type: Secondary | ICD-10-CM

## 2022-02-05 DIAGNOSIS — F10939 Alcohol use, unspecified with withdrawal, unspecified: Secondary | ICD-10-CM | POA: Diagnosis not present

## 2022-02-05 DIAGNOSIS — F17210 Nicotine dependence, cigarettes, uncomplicated: Secondary | ICD-10-CM

## 2022-02-05 NOTE — Progress Notes (Signed)
PROGRESS NOTE [name]  XLK:[id number] DOB: 06/15/1971 DOA: 02/02/2022 PCP: [name], MD   Brief Narrative/Hospital Course: 51 y.o.m w/ history significant of alcohol abuse, alcoholic liver cirrhosis, anxiety, PTSD, psychosis, schizoaffective disorder, alcohol withdrawa seizure disorder, GERD, history of symptomatic anemia, brought to the ED with being depressed, and with tremors and alcohol withdrawal, visual hallucination. Labs-UDS is positive for benzodiazepines and THC.  Alcohol level 115 mg/dL. Patient was placed on CIWA scale Ativan and admitted for further management-given high CIWA score- added Librium taper 6/19    Subjective: Still noted with some mild tremor, reported tenderness, but no redness or swelling noted.  Denies any new complaints    Assessment and Plan: Principal Problem:   Alcohol-induced mood disorder (HCC) Active Problems:   Hypokalemia   Tobacco dependence due to cigarettes   GERD (gastroesophageal reflux disease)   HTN (hypertension)   Alcoholic cirrhosis of liver with ascites (HCC)   Schizoaffective disorder, bipolar type ([place])   Alcohol intoxication ([place])   Hyperbilirubinemia   Alcohol withdrawal ([place])   Hyperammonemia (HCC)   Alcohol abuse with alcohol withdrawal Alcohol intoxication etoh in blood 115: Overall improving, we will continue with w/ Librium taper, prn ativan per CIWA score Psychiatry input appreciated Cont thiamine,folate, extensively discussed need to continue to stay away from alcohol.  Alcoholic cirrhosis of liver with ascites Hyperbilirubinemia Hyperammonemia Mentating well, LFTs stable with slightly elevated total bilirubin. Continue lactulose.    Alcohol-induced mood disorder: Schizoaffective disorder, bipolar type: Psychiatry input appreciated continue Neurontin, Zoloft, Seroquel. Psych following. Denies SI/HI currently  Alcohol withdrawal seizure disorder Cont Keppra iv for now> changed to p.o. once tolerating  well,did not take keppra for 4-5 days at home.  Tobacco dependence due to cigarettes- nicotine patch.  GERD-cont ppi  HTN: BP stable/soft, DC IV Lopressor.  Continue Aldactone  Medical non-compliance-needs counseling before discharge  Class I Obesity:Patient's Body mass index is 32.41 kg/m. : Will benefit with PCP follow-up, weight loss  healthy lifestyle and outpatient sleep evaluation.   DVT prophylaxis: enoxaparin (LOVENOX) injection 40 mg Start: 02/04/22 1000 SCDs Start: 02/02/22 1224 Code Status:   Code Status: Full Code Family Communication: plan of care discussed with patient at bedside. Patient status is: inpatient because of for etoh withdrawl Level of care: Progressive   Dispo: The patient is from: home            Anticipated disposition: Encourage PT evaluation   Mobility Assessment (last 72 hours)     Mobility Assessment     Row Name 02/05/22 1258 02/05/22 1100 02/04/22 1522 02/04/22 0755 02/02/22 2255   Does patient have an order for bedrest or is patient medically unstable -- No - Continue assessment -- No - Continue assessment No - Continue assessment   What is the highest level of mobility based on the progressive mobility assessment? Level 5 (Walks with assist in room/hall) - Balance while stepping forward/back and can walk in room with assist - Complete Level 5 (Walks with assist in room/hall) - Balance while stepping forward/back and can walk in room with assist - Complete Level 5 (Walks with assist in room/hall) - Balance while stepping forward/back and can walk in room with assist - Complete Level 5 (Walks with assist in room/hall) - Balance while stepping forward/back and can walk in room with assist - Complete Level 4 (Walks with assist in room) - Balance while marching in place and cannot step forward and back - Complete  Objective: Vitals last 24 hrs: Vitals:   02/04/22 1613 02/04/22 2107 02/05/22 0533 02/05/22 1227  BP: 119/78 (!) 128/95  119/80 125/89  Pulse: 81 76 72 81  Resp: '16 20 18 20  '$ Temp:  (!) 97.5 F (36.4 C) 98.4 F (36.9 C) 98 F (36.7 C)  TempSrc:  Oral    SpO2: 94% 95% 94% 96%  Weight:      Height:       Physical Examination: General: NAD, appears generally weak Cardiovascular: S1, S2 present Respiratory: CTAB Abdomen: Soft, nontender, nondistended, bowel sounds present Musculoskeletal: No bilateral pedal edema noted Skin: Normal Psychiatry: Normal mood   Medications reviewed:  Scheduled Meds:  chlordiazePOXIDE  25 mg Oral BID   Followed by   Derrill Memo ON 02/07/2022] chlordiazePOXIDE  25 mg Oral Daily   enoxaparin (LOVENOX) injection  40 mg Subcutaneous G92J   folic acid  1 mg Oral Daily   gabapentin  300 mg Oral TID   lactulose  30 g Oral TID   LORazepam  0-4 mg Oral Q8H   multivitamin with minerals  1 tablet Oral Daily   nicotine  21 mg Transdermal Daily   pantoprazole  40 mg Oral Daily   QUEtiapine  100 mg Oral QHS   sertraline  25 mg Oral Daily   spironolactone  100 mg Oral Daily   thiamine  100 mg Oral Daily   Or   thiamine  100 mg Intravenous Daily  Continuous Infusions:  levETIRAcetam 500 mg (02/05/22 1941)    Diet Order             Diet Heart Room service appropriate? Yes; Fluid consistency: Thin  Diet effective now                  Intake/Output Summary (Last 24 hours) at 02/05/2022 1833 Last data filed at 02/05/2022 1700 Gross per 24 hour  Intake 880 ml  Output 850 ml  Net 30 ml  Net IO Since Admission: 1,386 mL [02/05/22 1833]  Wt Readings from Last 3 Encounters:  02/02/22 108.4 kg  01/15/22 108.4 kg  01/12/22 105.3 kg   Unresulted Labs (From admission, onward)    None     Data Reviewed: I have personally reviewed following labs and imaging studies Recent Labs  Lab 02/02/22 0441 02/03/22 0317  WBC 5.3 7.1  HGB 15.1 14.1  HCT 44.3 43.1  MCV 96.3 100.7*  PLT 231 740   Basic Metabolic Panel: Recent Labs  Lab 02/02/22 0441 02/02/22 2052  02/03/22 0317  NA 137  --  141  K 3.4*  --  3.9  CL 94*  --  99  CO2 27  --  32  GLUCOSE 109*  --  121*  BUN 5*  --  12  CREATININE 0.76  --  1.02  CALCIUM 9.2  --  9.3  MG  --  2.4  --   PHOS  --  2.7  --   GFR: Estimated Creatinine Clearance: 111.4 mL/min (by C-G formula based on SCr of 1.02 mg/dL). Liver Function Tests: Recent Labs  Lab 02/02/22 0441 02/03/22 0317  AST 47* 48*  ALT 32 29  ALKPHOS 143* 133*  BILITOT 1.1 2.0*  PROT 7.7 7.0  ALBUMIN 4.3 4.0   No results for input(s): "LIPASE", "AMYLASE" in the last 168 hours. Recent Labs  Lab 02/02/22 0441  AMMONIA 48*  Antimicrobials: Anti-infectives (From admission, onward)    None     Culture/Microbiology    Component  Value Date/Time   SDES TRACHEAL ASPIRATE 07/21/2020 1332   SPECREQUEST NONE 07/21/2020 1332   CULT  07/21/2020 1332    NO GROWTH 2 DAYS Performed at [facility], [place] [address], [place], [place] [address]    REPTSTATUS 07/23/2020 FINAL 07/21/2020 1332  Radiology Studies:    Alma Friendly, MD [facility]  02/05/2022, 6:33 PM

## 2022-02-05 NOTE — Evaluation (Signed)
Occupational Therapy Evaluation Patient Details Name: [name] MRN: [id number] DOB: 08-10-1971 Today's Date: 02/05/2022   History of Present Illness 51 y.o.m w/ history significant of alcohol abuse, alcoholic liver cirrhosis, anxiety, PTSD, psychosis, schizoaffective disorder, alcohol withdrawa seizure disorder, GERD, history of symptomatic anemia   Brought to the ED with being depressed, and with tremors and alcohol withdrawal, visual hallucination   Clinical Impression   This 51 yo male admitted with above presents to acute OT with PLOF of being able to do all of his basic ADLs and walked to the store at times to get his groceries. Currently he needs a RW to be safe when up on his feet ambulating and pain in Bil feet-both affecting his safety and independence with basic ADLs and IADLs. He will continue to benefit from acute OT with follow up [place].      Recommendations for follow up therapy are one component of a multi-disciplinary discharge planning process, led by the attending physician.  Recommendations may be updated based on patient status, additional functional criteria and insurance authorization.   Follow Up Recommendations  Home health OT    Assistance Recommended at Discharge Intermittent Supervision/Assistance  Patient can return home with the following Assistance with cooking/housework;Assist for transportation    Functional Status Assessment  Patient has had a recent decline in their functional status and demonstrates the ability to make significant improvements in function in a reasonable and predictable amount of time.  Equipment Recommendations  None recommended by OT       Precautions / Restrictions Precautions Precautions: Fall Restrictions Weight Bearing Restrictions: No      Mobility Bed Mobility Overal bed mobility: Independent                  Transfers Overall transfer level: Needs assistance Equipment used: Rolling walker (2  wheels) Transfers: Sit to/from Stand                    Balance Overall balance assessment: Mild deficits observed, not formally tested                                         ADL either performed or assessed with clinical judgement   ADL Overall ADL's : Needs assistance/impaired Eating/Feeding: Independent;Sitting   Grooming: Min guard;Standing   Upper Body Bathing: Set up;Sitting   Lower Body Bathing: Min guard;Sit to/from stand   Upper Body Dressing : Set up;Sitting   Lower Body Dressing: Min guard;Sit to/from stand   Toilet Transfer: Min guard;Ambulation;Rolling walker (2 wheels);Regular Toilet;Grab bars Toilet Transfer Details (indicate cue type and reason): Min a without RW Toileting- Water quality scientist and Hygiene: Min guard;Sit to/from stand               Vision Baseline Vision/History: 1 Wears glasses Ability to See in Adequate Light: 0 Adequate Patient Visual Report: No change from baseline              Pertinent Vitals/Pain Pain Assessment Pain Assessment: 0-10 Pain Score: 6  Pain Location: Both feet but L>R Pain Descriptors / Indicators: Sore Pain Intervention(s): Limited activity within patient's tolerance, Monitored during session, Repositioned     Hand Dominance Right   Extremity/Trunk Assessment Upper Extremity Assessment Upper Extremity Assessment: Overall WFL for tasks assessed           Communication Communication Communication: No difficulties   Cognition Arousal/Alertness: Awake/alert  Behavior During Therapy: WFL for tasks assessed/performed Overall Cognitive Status: Within Functional Limits for tasks assessed                                                  Home Living Family/patient expects to be discharged to:: Private residence Living Arrangements: Alone Available Help at Discharge: Family;Available PRN/intermittently Type of Home: House Home Access: Stairs to  enter [employer] of Steps: 2 Entrance Stairs-Rails: Right;Left Home Layout: One level     Bathroom Shower/Tub: Teacher, early years/pre: Standard     Home Equipment: Shower seat   Additional Comments: pt has a RW but its broken      Prior Functioning/Environment Prior Level of Function : Independent/Modified Independent             Mobility Comments: Typically independent without DME; walks 2-3 blocks to grocery store and brings home items in backpack; primarily sedentary, enjoys computer games ADLs Comments: sits to shower due to balance issues        OT Problem List: Decreased strength;Impaired balance (sitting and/or standing);Pain      OT Treatment/Interventions: Self-care/ADL training;DME and/or AE instruction;Balance training    OT Goals(Current goals can be found in the care plan section) Acute Rehab OT Goals Patient Stated Goal: to feel better and go home OT Goal Formulation: With patient Time For Goal Achievement: 02/19/22 Potential to Achieve Goals: Good  OT Frequency: Min 2X/week       AM-PAC OT "6 Clicks" Daily Activity     Outcome Measure Help from another person eating meals?: None Help from another person taking care of personal grooming?: A Little Help from another person toileting, which includes using toliet, bedpan, or urinal?: A Little Help from another person bathing (including washing, rinsing, drying)?: A Little Help from another person to put on and taking off regular upper body clothing?: A Little Help from another person to put on and taking off regular lower body clothing?: A Little 6 Click Score: 19   End of Session Equipment Utilized During Treatment: Gait belt;Rolling walker (2 wheels)  Activity Tolerance: Patient tolerated treatment well Patient left: in bed;with call bell/phone within reach;with bed alarm set  OT Visit Diagnosis: Unsteadiness on feet (R26.81);Other abnormalities of gait and mobility  (R26.89);Pain Pain - part of body:  (bil feet)                Time: 8850-2774 OT Time Calculation (min): 26 min Charges:  OT General Charges $OT Visit: 1 Visit OT Evaluation $OT Eval Moderate Complexity: 1 Mod OT Treatments $Self Care/Home Management : 8-22 mins  [place], OTR/L Acute Rehab Services [facility] [place] [phone] Office [phone]    Almon Register 02/05/2022, 1:05 PM

## 2022-02-06 DIAGNOSIS — I10 Essential (primary) hypertension: Secondary | ICD-10-CM

## 2022-02-06 DIAGNOSIS — F10929 Alcohol use, unspecified with intoxication, unspecified: Secondary | ICD-10-CM

## 2022-02-06 DIAGNOSIS — E876 Hypokalemia: Secondary | ICD-10-CM

## 2022-02-06 LAB — COMPREHENSIVE METABOLIC PANEL
ALT: 49 U/L — ABNORMAL HIGH (ref 0–44)
AST: 74 U/L — ABNORMAL HIGH (ref 15–41)
Albumin: 3.8 g/dL (ref 3.5–5.0)
Alkaline Phosphatase: 116 U/L (ref 38–126)
Anion gap: 9 (ref 5–15)
BUN: 19 mg/dL (ref 6–20)
CO2: 23 mmol/L (ref 22–32)
Calcium: 9.7 mg/dL (ref 8.9–10.3)
Chloride: 107 mmol/L (ref 98–111)
Creatinine, Ser: 1 mg/dL (ref 0.61–1.24)
GFR, Estimated: >60 mL/min (ref >60)
Glucose, Bld: 99 mg/dL (ref 70–99)
Potassium: 4.1 mmol/L (ref 3.5–5.1)
Sodium: 139 mmol/L (ref 135–145)
Total Bilirubin: 1.3 mg/dL — ABNORMAL HIGH (ref 0.3–1.2)
Total Protein: 6.9 g/dL (ref 6.5–8.1)

## 2022-02-06 MED ORDER — CHLORDIAZEPOXIDE HCL 25 MG PO CAPS: 25.0000 mg | ORAL_CAPSULE | Freq: Once | ORAL | 0 refills | Status: AC

## 2022-02-06 MED ORDER — FOLIC ACID 1 MG PO TABS: 1.0000 mg | ORAL_TABLET | Freq: Every day | ORAL | 0 refills | Status: AC

## 2022-02-06 NOTE — Progress Notes (Signed)
Patient to be D/C'd home as ordered, sister will be here in 10 mins for transportation. Patient alert, feels more calm than earlier. Able to stand and dress himself. D/C instructions given and explained, pt verbalizes understanding. Per CM, someone will call pt with update on [facility] ordered. Pt aware.

## 2022-02-06 NOTE — Progress Notes (Signed)
Physical Therapy Treatment Patient Details Name: [name] MRN: [id number] DOB: [birth year] Today's Date: 02/06/2022   History of Present Illness 51 y.o.m w/ history significant of alcohol abuse, alcoholic liver cirrhosis, anxiety, PTSD, psychosis, schizoaffective disorder, alcohol withdrawa seizure disorder, GERD, history of symptomatic anemia   Brought to the ED with being depressed, and with tremors and alcohol withdrawal, visual hallucination    PT Comments    Pt is progressing well with mobility, he ambulated 400' with RW, no loss of balance. HR 104 with walking. Pt reported chronic B foot numbness, with h/o 3-4 falls in past 6 months, he attributes the falls to the neuropathy rather than to times of intoxication. He has a RW that was recently issued, however he reports it is broken. He would benefit from a new RW. Pt was encouraged to perform seated hip flexion and knee extension to minimize deconditioning during hospitalization.     Recommendations for follow up therapy are one component of a multi-disciplinary discharge planning process, led by the attending physician.  Recommendations may be updated based on patient status, additional functional criteria and insurance authorization.  Follow Up Recommendations  Home health PT     Assistance Recommended at Discharge Set up Supervision/Assistance  Patient can return home with the following Assistance with cooking/housework;Assist for transportation;Help with stairs or ramp for entrance   Equipment Recommendations  Rolling walker (2 wheels) (pt has a RW but it is broken)    Recommendations for Other Services       Precautions / Restrictions Precautions Precautions: Fall Restrictions Weight Bearing Restrictions: No     Mobility  Bed Mobility Overal bed mobility: Independent                  Transfers Overall transfer level: Needs assistance Equipment used: Rolling walker (2 wheels) Transfers: Sit to/from  Stand Sit to Stand: Supervision           General transfer comment: supervision for safety, VCs hand placement    Ambulation/Gait Ambulation/Gait assistance: Supervision Gait Distance (Feet): 400 Feet Assistive device: Rolling walker (2 wheels) Gait Pattern/deviations: Step-through pattern, Wide base of support, Decreased stride length Gait velocity: WFL     General Gait Details: no loss of balance, pt reports chronic numbness on bottom of B feet   Stairs             Wheelchair Mobility    Modified Rankin (Stroke Patients Only)       Balance Overall balance assessment: Mild deficits observed, not formally tested                                          Cognition Arousal/Alertness: Awake/alert Behavior During Therapy: WFL for tasks assessed/performed Overall Cognitive Status: Within Functional Limits for tasks assessed                                 General Comments: pt seems depressed        Exercises      General Comments General comments (skin integrity, edema, etc.): B foot neuropathy      Pertinent Vitals/Pain Pain Assessment Pain Score: 5  Pain Location: L foot Pain Descriptors / Indicators: Sore Pain Intervention(s): Limited activity within patient's tolerance, Monitored during session, Patient requesting pain meds-RN notified    Home Living  Prior Function            PT Goals (current goals can now be found in the care plan section) Acute Rehab PT Goals Patient Stated Goal: decrease pain, get some help at home, would like to get a dog PT Goal Formulation: With patient Time For Goal Achievement: 02/18/22 Potential to Achieve Goals: Good Progress towards PT goals: Progressing toward goals    Frequency    Min 3X/week      PT Plan Current plan remains appropriate    Co-evaluation              AM-PAC PT "6 Clicks" Mobility   Outcome Measure  Help  needed turning from your back to your side while in a flat bed without using bedrails?: None Help needed moving from lying on your back to sitting on the side of a flat bed without using bedrails?: None Help needed moving to and from a bed to a chair (including a wheelchair)?: A Little Help needed standing up from a chair using your arms (e.g., wheelchair or bedside chair)?: None Help needed to walk in hospital room?: None Help needed climbing 3-5 steps with a railing? : None 6 Click Score: 23    End of Session Equipment Utilized During Treatment: Gait belt Activity Tolerance: Patient limited by pain Patient left: in chair;with chair alarm set;with call bell/phone within reach Nurse Communication: Mobility status;Patient requests pain meds PT Visit Diagnosis: Difficulty in walking, not elsewhere classified (R26.2);Pain Pain - Right/Left: Left Pain - part of body: Ankle and joints of foot     Time: 3785-8850 PT Time Calculation (min) (ACUTE ONLY): 22 min  Charges:  $Gait Training: 8-22 mins                     Blondell Reveal Kistler PT 02/06/2022  Acute Rehabilitation Services  Office [phone]

## 2022-02-06 NOTE — Progress Notes (Signed)
Occupational Therapy Treatment Patient Details Name: [name] MRN: [id number] DOB: January 21, 1971 Today's Date: 02/06/2022   History of present illness 51 y.o.m w/ history significant of alcohol abuse, alcoholic liver cirrhosis, anxiety, PTSD, psychosis, schizoaffective disorder, alcohol withdrawa seizure disorder, GERD, history of symptomatic anemia   Brought to the ED with being depressed, and with tremors and alcohol withdrawal, visual hallucination   OT comments  This 51 yo male seen today with progression from a minguard A for basic ADLs yesterday at RW to S level today at RW level. He continues with a very flat affect. He will continue to benefit from acute OT with followup HHOT.   Recommendations for follow up therapy are one component of a multi-disciplinary discharge planning process, led by the attending physician.  Recommendations may be updated based on patient status, additional functional criteria and insurance authorization.    Follow Up Recommendations  Home health OT    Assistance Recommended at Discharge Intermittent Supervision/Assistance  Patient can return home with the following  Assistance with cooking/housework;Assist for transportation   Equipment Recommendations  None recommended by OT       Precautions / Restrictions Precautions Precautions: Fall Restrictions Weight Bearing Restrictions: No       Mobility Bed Mobility               General bed mobility comments: up in recliner upon arrival    Transfers Overall transfer level: Needs assistance Equipment used: Rolling walker (2 wheels) Transfers: Sit to/from Stand Sit to Stand: Supervision                 Balance Overall balance assessment: Mild deficits observed, not formally tested                                         ADL either performed or assessed with clinical judgement   ADL                                         General ADL  Comments: overall at S level,  close to Mod I at RW level for basic ADLs    Extremity/Trunk Assessment Upper Extremity Assessment Upper Extremity Assessment: Overall WFL for tasks assessed            Vision Baseline Vision/History: 1 Wears glasses Ability to See in Adequate Light: 0 Adequate Patient Visual Report: No change from baseline            Cognition Arousal/Alertness: Awake/alert Behavior During Therapy: Flat affect Overall Cognitive Status: Within Functional Limits for tasks assessed                                                General Comments B foot neuropathy    Pertinent Vitals/ Pain       Pain Assessment Pain Assessment: 0-10 Pain Score: 5  Pain Location: L foot Pain Descriptors / Indicators: Sore Pain Intervention(s): Limited activity within patient's tolerance, Monitored during session, Repositioned         Frequency  Min 2X/week        Progress Toward Goals  OT Goals(current goals can now be found in the care plan  section)  Progress towards OT goals: Progressing toward goals  Acute Rehab OT Goals Patient Stated Goal: to continue to feel better OT Goal Formulation: With patient Time For Goal Achievement: 02/19/22 Potential to Achieve Goals: Good  Plan Discharge plan remains appropriate       AM-PAC OT "6 Clicks" Daily Activity     Outcome Measure   Help from another person eating meals?: None Help from another person taking care of personal grooming?: A Little Help from another person toileting, which includes using toliet, bedpan, or urinal?: A Little Help from another person bathing (including washing, rinsing, drying)?: A Little Help from another person to put on and taking off regular upper body clothing?: A Little Help from another person to put on and taking off regular lower body clothing?: A Little 6 Click Score: 19    End of Session Equipment Utilized During Treatment: Rolling walker (2 wheels)  OT  Visit Diagnosis: Unsteadiness on feet (R26.81);Other abnormalities of gait and mobility (R26.89);Pain Pain - Right/Left: Left Pain - part of body:  (foot)   Activity Tolerance Patient tolerated treatment well   Patient Left in chair;with call bell/phone within reach;with chair alarm set   Nurse Communication          Time: 9242-6834 OT Time Calculation (min): 19 min  Charges: OT General Charges $OT Visit: 1 Visit OT Treatments $Self Care/Home Management : 8-22 mins  Golden Circle, OTR/L Acute Rehab Services [facility] [phone] Office [phone]    Almon Register 02/06/2022, 10:53 AM

## 2022-02-06 NOTE — TOC Progression Note (Signed)
Transition of Care [facility]) - Progression Note    Patient Details  Name: [name] MRN: [id number] Date of Birth: 12/04/1970  Transition of Care [facility]) CM/SW Contact  Golda Acre, RN Phone Number: 02/06/2022, 11:23 AM  Clinical Narrative:    Pt has rolling walker but has had it less than 2 years.  States that it is broken.  Insurance will not replace within 5 years.  Attempted to obtain one through the congregational nurse program.  None available.  Patient can cheaply pick one up at the nearest [employer] for 5-10 dollars.  Is agreeable to this.   Expected Discharge Plan: Home/Self Care Barriers to Discharge: Continued Medical Work up  Expected Discharge Plan and Services Expected Discharge Plan: Home/Self Care   Discharge Planning Services: CM Consult   Living arrangements for the past 2 months: Single Family Home Expected Discharge Date: 02/06/22                                     Social Determinants of Health (SDOH) Interventions    Readmission Risk Interventions    09/19/2021   12:21 PM  Readmission Risk Prevention Plan  HRI or Home Care Consult Complete  Social Work Consult for Recovery Care Planning/Counseling Complete  Palliative Care Screening Not Applicable

## 2022-02-06 NOTE — Plan of Care (Signed)
  Problem: Clinical Measurements: Goal: Diagnostic test results will improve Outcome: Progressing Goal: Respiratory complications will improve Outcome: Progressing Goal: Cardiovascular complication will be avoided Outcome: Progressing   

## 2022-02-10 ENCOUNTER — Telehealth: Payer: Self-pay

## 2022-03-05 ENCOUNTER — Encounter: Payer: Self-pay | Admitting: Physician Assistant

## 2022-03-06 ENCOUNTER — Ambulatory Visit (INDEPENDENT_AMBULATORY_CARE_PROVIDER_SITE_OTHER): Payer: Medicare Other | Admitting: Family Medicine

## 2022-03-06 ENCOUNTER — Encounter: Payer: Self-pay | Admitting: Family Medicine

## 2022-03-06 DIAGNOSIS — E7220 Disorder of urea cycle metabolism, unspecified: Secondary | ICD-10-CM | POA: Diagnosis not present

## 2022-03-06 DIAGNOSIS — M1A9XX0 Chronic gout, unspecified, without tophus (tophi): Secondary | ICD-10-CM | POA: Diagnosis not present

## 2022-03-06 MED ORDER — LACTULOSE 10 GM/15ML PO SOLN: 30.0000 g | Freq: Three times a day (TID) | ORAL | 0 refills | Status: AC

## 2022-03-06 MED ORDER — IBUPROFEN 600 MG PO TABS: 600.0000 mg | ORAL_TABLET | Freq: Three times a day (TID) | ORAL | 0 refills | Status: DC | PRN

## 2022-03-06 NOTE — Progress Notes (Signed)
Virtual Visit via Telephone Note  I connected with [name] on 03/06/22 at  1:40 PM EDT by telephone and verified that I am speaking with the correct person using two identifiers.  Location: Patient: [name] Provider: [place]   I discussed the limitations, risks, security and privacy concerns of performing an evaluation and management service by telephone and the availability of in person appointments. I also discussed with the patient that there may be a patient responsible charge related to this service. The patient expressed understanding and agreed to proceed.   History of Present Illness: Patient reports that he has not been able to get into the office 2/2 transportation issues. He reports a gout flare that is being helped with ibuprofen and he would like a refill on his lactulose. He is not aware on if he needs any additional refills.    Observations/Objective:   Assessment and Plan: 1. Chronic gout without tophus, unspecified cause, unspecified site Ibuprofen prescribed  2. Hyperammonemia (HCC) Lactulose refilled    Follow Up Instructions:    I discussed the assessment and treatment plan with the patient. The patient was provided an opportunity to ask questions and all were answered. The patient agreed with the plan and demonstrated an understanding of the instructions.   The patient was advised to call back or seek an in-person evaluation if the symptoms worsen or if the condition fails to improve as anticipated.  I provided 12 minutes of non-face-to-face time during this encounter.   Becky Sax, MD

## 2022-03-15 ENCOUNTER — Other Ambulatory Visit: Payer: Self-pay | Admitting: Family Medicine

## 2022-03-15 DIAGNOSIS — F251 Schizoaffective disorder, depressive type: Secondary | ICD-10-CM

## 2022-03-24 ENCOUNTER — Other Ambulatory Visit: Payer: Self-pay | Admitting: *Deleted

## 2022-03-24 DIAGNOSIS — K7031 Alcoholic cirrhosis of liver with ascites: Secondary | ICD-10-CM

## 2022-03-24 DIAGNOSIS — K219 Gastro-esophageal reflux disease without esophagitis: Secondary | ICD-10-CM

## 2022-03-24 MED ORDER — PANTOPRAZOLE SODIUM 40 MG PO TBEC: 40.0000 mg | DELAYED_RELEASE_TABLET | Freq: Every day | ORAL | 2 refills | Status: DC

## 2022-03-24 MED ORDER — SPIRONOLACTONE 100 MG PO TABS: 100.0000 mg | ORAL_TABLET | Freq: Every day | ORAL | 1 refills | Status: DC

## 2022-05-03 ENCOUNTER — Other Ambulatory Visit: Payer: Self-pay | Admitting: Family Medicine

## 2022-05-03 MED ORDER — METOPROLOL SUCCINATE ER 25 MG PO TB24: 25.0000 mg | ORAL_TABLET | Freq: Every day | ORAL | 0 refills | Status: DC

## 2022-05-19 ENCOUNTER — Other Ambulatory Visit: Payer: Self-pay | Admitting: Family Medicine

## 2022-05-19 DIAGNOSIS — K7031 Alcoholic cirrhosis of liver with ascites: Secondary | ICD-10-CM

## 2022-05-19 NOTE — Telephone Encounter (Signed)
Medication Refill - Medication: levETIRAcetam (KEPPRA) 500 MG tablet spironolactone (ALDACTONE) 100 MG tablet QUEtiapine (SEROQUEL) 200 MG tablet  Has the patient contacted their pharmacy? No. Pt gave me hard time about calling in w/ no refill, so I said ok  Preferred Pharmacy (with phone number or street name): [facility], [place] [address]  Has the patient been seen for an appointment in the last year OR does the patient have an upcoming appointment? Yes.    Agent: Please be advised that RX refills may take up to 3 business days. We ask that you follow-up with your pharmacy.

## 2022-05-20 ENCOUNTER — Other Ambulatory Visit: Payer: Self-pay | Admitting: Physician Assistant

## 2022-05-20 NOTE — Telephone Encounter (Signed)
Requested medication (s) are due for refill today:yes  Requested medication (s) are on the active medication list: yes  Last refill:  multiple dates  Future visit scheduled: yes  Notes to clinic:  Unable to refill per protocol, cannot delegate seroquel. Keppra was prescribed by another provider, routing for review.     Requested Prescriptions  Pending Prescriptions Disp Refills   levETIRAcetam (KEPPRA) 500 MG tablet 60 tablet 2    Sig: Take 1 tablet (500 mg total) by mouth 2 (two) times daily.     Neurology:  Anticonvulsants - levetiracetam Passed - 05/19/2022  2:06 PM      Passed - Cr in normal range and within 360 days    Creat  Date Value Ref Range Status  09/13/2015 0.93 0.60 - 1.35 mg/dL Final   Creatinine, Ser  Date Value Ref Range Status  02/06/2022 1.00 0.61 - 1.24 mg/dL Final   Creatinine, Urine  Date Value Ref Range Status  01/10/2019 72.85 mg/dL Final    Comment:    Performed at [facility], [place] [address], [place], [place] [address]         Passed - Completed PHQ-2 or PHQ-9 in the last 360 days      Passed - Valid encounter within last 12 months    Recent Outpatient Visits           2 months ago Chronic gout without tophus, unspecified cause, unspecified site   Primary Care at [facility], Clyde Canterbury, MD   4 months ago Alcoholic cirrhosis of liver with ascites [facility])   Primary Care at [facility], Cari S, PA-C   5 months ago Acute conjunctivitis of left eye, unspecified acute conjunctivitis type   Primary Care at [facility], MD   1 year ago Trigger middle finger of right hand   Primary Care at [facility], MD   1 year ago Neuropathic pain   Primary Care at [facility], Bayard Beaver, MD       Future Appointments             In 1 week [name], MD Primary Care at [facility]             spironolactone (ALDACTONE) 100 MG tablet 30 tablet 1    Sig:  Take 1 tablet (100 mg total) by mouth daily.     Cardiovascular: Diuretics - Aldosterone Antagonist Passed - 05/19/2022  2:06 PM      Passed - Cr in normal range and within 180 days    Creat  Date Value Ref Range Status  09/13/2015 0.93 0.60 - 1.35 mg/dL Final   Creatinine, Ser  Date Value Ref Range Status  02/06/2022 1.00 0.61 - 1.24 mg/dL Final   Creatinine, Urine  Date Value Ref Range Status  01/10/2019 72.85 mg/dL Final    Comment:    Performed at [facility], [place] [address] [place], [place] [address]         Passed - K in normal range and within 180 days    Potassium  Date Value Ref Range Status  02/06/2022 4.1 3.5 - 5.1 mmol/L Final         Passed - Na in normal range and within 180 days    Sodium  Date Value Ref Range Status  02/06/2022 139 135 - 145 mmol/L Final  12/27/2020 137 134 - 144 mmol/L Final         Passed -  eGFR is 30 or above and within 180 days    GFR, Est African American  Date Value Ref Range Status  09/13/2015 >89 >=60 mL/min Final   GFR calc Af Amer  Date Value Ref Range Status  09/20/2020 88 >59 mL/min/1.73 Final    Comment:    **In accordance with recommendations from the NKF-ASN Task force,**   [facility] is in the process of updating its eGFR calculation to the   2021 CKD-EPI creatinine equation that estimates kidney function   without a race variable.    GFR, Est Non African American  Date Value Ref Range Status  09/13/2015 >89 >=60 mL/min Final    Comment:      The estimated GFR is a calculation valid for adults (>=28 years old) that uses the CKD-EPI algorithm to adjust for age and sex. It is   not to be used for children, pregnant women, hospitalized patients,    patients on dialysis, or with rapidly changing kidney function. According to the NKDEP, eGFR >89 is normal, 60-89 shows mild impairment, 30-59 shows moderate impairment, 15-29 shows severe impairment and <15 is ESRD.      GFR, Estimated  Date  Value Ref Range Status  02/06/2022 >60 >60 mL/min Final    Comment:    (NOTE) Calculated using the CKD-EPI Creatinine Equation (2021)    GFR  Date Value Ref Range Status  12/20/2020 93.70 >60.00 mL/min Final    Comment:    Calculated using the CKD-EPI Creatinine Equation (2021)   eGFR  Date Value Ref Range Status  12/27/2020 93 >59 mL/min/1.73 Final         Passed - Last BP in normal range    BP Readings from Last 1 Encounters:  02/06/22 133/86         Passed - Valid encounter within last 6 months    Recent Outpatient Visits           2 months ago Chronic gout without tophus, unspecified cause, unspecified site   Primary Care at [place], MD   4 months ago Alcoholic cirrhosis of liver with ascites [facility])   Primary Care at [facility], Cari S, PA-C   5 months ago Acute conjunctivitis of left eye, unspecified acute conjunctivitis type   Primary Care at [facility], MD   1 year ago Trigger middle finger of right hand   Primary Care at [facility], MD   1 year ago Neuropathic pain   Primary Care at [facility], Bayard Beaver, MD       Future Appointments             In 1 week [name], MD Primary Care at [facility]             QUEtiapine (SEROQUEL) 200 MG tablet 30 tablet 2    Sig: Take 1 tablet (200 mg total) by mouth at bedtime.     Not Delegated - Psychiatry:  Antipsychotics - Second Generation (Atypical) - quetiapine Failed - 05/19/2022  2:06 PM      Failed - This refill cannot be delegated      Failed - Lipid Panel in normal range within the last 12 months    Cholesterol  Date Value Ref Range Status  03/02/2019 159 0 - 200 mg/dL Final   LDL Cholesterol  Date Value Ref Range Status  03/02/2019 84 0 - 99 mg/dL Final    Comment:  Total Cholesterol/HDL:CHD Risk Coronary Heart Disease Risk Table                     Men   Women  1/2 Average Risk   3.4    3.3  Average Risk       5.0   4.4  2 X Average Risk   9.6   7.1  3 X Average Risk  23.4   11.0        Use the calculated Patient Ratio above and the CHD Risk Table to determine the patient's CHD Risk.        ATP III CLASSIFICATION (LDL):  <100     mg/dL   Optimal  100-129  mg/dL   Near or Above                    Optimal  130-159  mg/dL   Borderline  160-189  mg/dL   High  >190     mg/dL   Very High Performed at [place] [address] [place], [place] [address]    HDL  Date Value Ref Range Status  03/02/2019 53 >40 mg/dL Final   Triglycerides  Date Value Ref Range Status  07/21/2020 164 (H) <150 mg/dL Final    Comment:    Performed at [facility], [place] [address] [place],  [address]         Passed - TSH in normal range and within 360 days    TSH  Date Value Ref Range Status  09/08/2021 0.853 0.350 - 4.500 uIU/mL Final    Comment:    Performed by a 3rd Generation assay with a functional sensitivity of <=0.01 uIU/mL. Performed at [facility], [place] [address], [place],  [address]   12/27/2020 1.650 0.450 - 4.500 uIU/mL Final         Passed - Completed PHQ-2 or PHQ-9 in the last 360 days      Passed - Last BP in normal range    BP Readings from Last 1 Encounters:  02/06/22 133/86         Passed - Last Heart Rate in normal range    Pulse Readings from Last 1 Encounters:  02/06/22 73         Passed - Valid encounter within last 6 months    Recent Outpatient Visits           2 months ago Chronic gout without tophus, unspecified cause, unspecified site   Primary Care at [facility], Clyde Canterbury, MD   4 months ago Alcoholic cirrhosis of liver with ascites [facility])   Primary Care at [facility], Cari S, PA-C   5 months ago Acute conjunctivitis of left eye, unspecified acute conjunctivitis type   Primary Care at [facility], MD   1 year ago Trigger middle finger of right hand    Primary Care at [facility], MD   1 year ago Neuropathic pain   Primary Care at [facility], Bayard Beaver, MD       Future Appointments             In 1 week [name], MD Primary Care at [facility] - CBC within normal limits and completed in the last 12 months    WBC  Date Value Ref Range Status  02/03/2022 7.1 4.0 -  10.5 K/uL Final   RBC  Date Value Ref Range Status  02/03/2022 4.28 4.22 - 5.81 MIL/uL Final   Hemoglobin  Date Value Ref Range Status  02/03/2022 14.1 13.0 - 17.0 g/dL Final  12/27/2020 13.8 13.0 - 17.7 g/dL Final   HCT  Date Value Ref Range Status  02/03/2022 43.1 39.0 - 52.0 % Final   Hematocrit  Date Value Ref Range Status  12/27/2020 42.9 37.5 - 51.0 % Final   MCHC  Date Value Ref Range Status  02/03/2022 32.7 30.0 - 36.0 g/dL Final   [facility]  Date Value Ref Range Status  02/03/2022 32.9 26.0 - 34.0 pg Final   MCV  Date Value Ref Range Status  02/03/2022 100.7 (H) 80.0 - 100.0 fL Final  12/27/2020 95 79 - 97 fL Final   No results found for: "PLTCOUNTKUC", "LABPLAT", "POCPLA" RDW  Date Value Ref Range Status  02/03/2022 15.6 (H) 11.5 - 15.5 % Final  12/27/2020 12.7 11.6 - 15.4 % Final         Passed - CMP within normal limits and completed in the last 12 months    Albumin  Date Value Ref Range Status  02/06/2022 3.8 3.5 - 5.0 g/dL Final  09/20/2020 4.2 4.0 - 5.0 g/dL Final   Alkaline Phosphatase  Date Value Ref Range Status  02/06/2022 116 38 - 126 U/L Final   ALT  Date Value Ref Range Status  02/06/2022 49 (H) 0 - 44 U/L Final   AST  Date Value Ref Range Status  02/06/2022 74 (H) 15 - 41 U/L Final   BUN  Date Value Ref Range Status  02/06/2022 19 6 - 20 mg/dL Final  12/27/2020 13 6 - 24 mg/dL Final   Calcium  Date Value Ref Range Status  02/06/2022 9.7 8.9 - 10.3 mg/dL Final   Calcium, Ion  Date Value Ref Range Status  07/20/2020 1.02 (L) 1.15 - 1.40 mmol/L  Final   CO2  Date Value Ref Range Status  02/06/2022 23 22 - 32 mmol/L Final   Bicarbonate  Date Value Ref Range Status  07/20/2020 28.9 (H) 20.0 - 28.0 mmol/L Final   TCO2  Date Value Ref Range Status  07/20/2020 30 22 - 32 mmol/L Final   Creat  Date Value Ref Range Status  09/13/2015 0.93 0.60 - 1.35 mg/dL Final   Creatinine, Ser  Date Value Ref Range Status  02/06/2022 1.00 0.61 - 1.24 mg/dL Final   Creatinine, Urine  Date Value Ref Range Status  01/10/2019 72.85 mg/dL Final    Comment:    Performed at [facility], [place] [address], [place], [place] [address]   Glucose, Bld  Date Value Ref Range Status  02/06/2022 99 70 - 99 mg/dL Final    Comment:    Glucose reference range applies only to samples taken after fasting for at least 8 hours.   Glucose-Capillary  Date Value Ref Range Status  09/15/2021 114 (H) 70 - 99 mg/dL Final    Comment:    Glucose reference range applies only to samples taken after fasting for at least 8 hours.   Potassium  Date Value Ref Range Status  02/06/2022 4.1 3.5 - 5.1 mmol/L Final   Sodium  Date Value Ref Range Status  02/06/2022 139 135 - 145 mmol/L Final  12/27/2020 137 134 - 144 mmol/L Final   Total Bilirubin  Date Value Ref Range Status  02/06/2022 1.3 (H) 0.3 - 1.2 mg/dL Final  Bilirubin Total  Date Value Ref Range Status  09/20/2020 1.3 (H) 0.0 - 1.2 mg/dL Final   Bilirubin, Direct  Date Value Ref Range Status  07/19/2020 13.0 (H) 0.0 - 0.2 mg/dL Final    Comment:    RESULTS CONFIRMED BY MANUAL DILUTION   Indirect Bilirubin  Date Value Ref Range Status  07/19/2020 NOT CALCULATED 0.3 - 0.9 mg/dL Final    Comment:    Performed at [place] [address] [place], [place] [address]   Protein, ur  Date Value Ref Range Status  01/10/2022 100 (A) NEGATIVE mg/dL Final   Total Protein  Date Value Ref Range Status  02/06/2022 6.9 6.5 - 8.1 g/dL Final  09/20/2020 6.8 6.0 - 8.5 g/dL  Final   GFR, Est African American  Date Value Ref Range Status  09/13/2015 >89 >=60 mL/min Final   GFR calc Af Amer  Date Value Ref Range Status  09/20/2020 88 >59 mL/min/1.73 Final    Comment:    **In accordance with recommendations from the NKF-ASN Task force,**   [facility] is in the process of updating its eGFR calculation to the   2021 CKD-EPI creatinine equation that estimates kidney function   without a race variable.    GFR  Date Value Ref Range Status  12/20/2020 93.70 >60.00 mL/min Final    Comment:    Calculated using the CKD-EPI Creatinine Equation (2021)   eGFR  Date Value Ref Range Status  12/27/2020 93 >59 mL/min/1.73 Final   GFR, Est Non African American  Date Value Ref Range Status  09/13/2015 >89 >=60 mL/min Final    Comment:      The estimated GFR is a calculation valid for adults (>=39 years old) that uses the CKD-EPI algorithm to adjust for age and sex. It is   not to be used for children, pregnant women, hospitalized patients,    patients on dialysis, or with rapidly changing kidney function. According to the NKDEP, eGFR >89 is normal, 60-89 shows mild impairment, 30-59 shows moderate impairment, 15-29 shows severe impairment and <15 is ESRD.      GFR, Estimated  Date Value Ref Range Status  02/06/2022 >60 >60 mL/min Final    Comment:    (NOTE) Calculated using the CKD-EPI Creatinine Equation (2021)

## 2022-05-20 NOTE — Telephone Encounter (Signed)
Requested medication (s) are due for refill today:yes  Requested medication (s) are on the active medication list: yes    Last refill: 01/15/22   #30    2 refills  Future visit scheduled yes  06/02/22  Notes to clinic:Not delegated, please review. Thank you.  Requested Prescriptions  Pending Prescriptions Disp Refills   QUEtiapine (SEROQUEL) 200 MG tablet [Pharmacy Med Name: QUEtiapine Fumarate 200MG TABS*] 30 tablet 2    Sig: Take 1 tablet (200 mg total) by mouth at bedtime.     Not Delegated - Psychiatry:  Antipsychotics - Second Generation (Atypical) - quetiapine Failed - 05/20/2022  8:17 AM      Failed - This refill cannot be delegated      Failed - Lipid Panel in normal range within the last 12 months    Cholesterol  Date Value Ref Range Status  03/02/2019 159 0 - 200 mg/dL Final   LDL Cholesterol  Date Value Ref Range Status  03/02/2019 84 0 - 99 mg/dL Final    Comment:           Total Cholesterol/HDL:CHD Risk Coronary Heart Disease Risk Table                     Men   Women  1/2 Average Risk   3.4   3.3  Average Risk       5.0   4.4  2 X Average Risk   9.6   7.1  3 X Average Risk  23.4   11.0        Use the calculated Patient Ratio above and the CHD Risk Table to determine the patient's CHD Risk.        ATP III CLASSIFICATION (LDL):  <100     mg/dL   Optimal  100-129  mg/dL   Near or Above                    Optimal  130-159  mg/dL   Borderline  160-189  mg/dL   High  >190     mg/dL   Very High Performed at [place] [address], [place], [place] [address]    HDL  Date Value Ref Range Status  03/02/2019 53 >40 mg/dL Final   Triglycerides  Date Value Ref Range Status  07/21/2020 164 (H) <150 mg/dL Final    Comment:    Performed at [facility], [place] [address], [place], [place] [address]         Passed - TSH in normal range and within 360 days    TSH  Date Value Ref Range Status  09/08/2021 0.853 0.350 - 4.500 uIU/mL Final     Comment:    Performed by a 3rd Generation assay with a functional sensitivity of <=0.01 uIU/mL. Performed at [facility], [place] [address] [place], [place] [address]   12/27/2020 1.650 0.450 - 4.500 uIU/mL Final         Passed - Completed PHQ-2 or PHQ-9 in the last 360 days      Passed - Last BP in normal range    BP Readings from Last 1 Encounters:  02/06/22 133/86         Passed - Last Heart Rate in normal range    Pulse Readings from Last 1 Encounters:  02/06/22 73         Passed - Valid encounter within last 6 months    Recent Outpatient  Visits           2 months ago Chronic gout without tophus, unspecified cause, unspecified site   Primary Care at [facility], MD   4 months ago Alcoholic cirrhosis of liver with ascites [facility])   Primary Care at [facility], Cari S, PA-C   5 months ago Acute conjunctivitis of left eye, unspecified acute conjunctivitis type   Primary Care at [facility], MD   1 year ago Trigger middle finger of right hand   Primary Care at [facility], MD   1 year ago Neuropathic pain   Primary Care at [facility], Bayard Beaver, MD       Future Appointments             In 1 week [name], MD Primary Care at [facility] - CBC within normal limits and completed in the last 12 months    WBC  Date Value Ref Range Status  02/03/2022 7.1 4.0 - 10.5 K/uL Final   RBC  Date Value Ref Range Status  02/03/2022 4.28 4.22 - 5.81 MIL/uL Final   Hemoglobin  Date Value Ref Range Status  02/03/2022 14.1 13.0 - 17.0 g/dL Final  12/27/2020 13.8 13.0 - 17.7 g/dL Final   HCT  Date Value Ref Range Status  02/03/2022 43.1 39.0 - 52.0 % Final   Hematocrit  Date Value Ref Range Status  12/27/2020 42.9 37.5 - 51.0 % Final   MCHC  Date Value Ref Range Status  02/03/2022 32.7 30.0 - 36.0 g/dL Final   [facility]  Date Value Ref Range Status   02/03/2022 32.9 26.0 - 34.0 pg Final   MCV  Date Value Ref Range Status  02/03/2022 100.7 (H) 80.0 - 100.0 fL Final  12/27/2020 95 79 - 97 fL Final   No results found for: "PLTCOUNTKUC", "LABPLAT", "POCPLA" RDW  Date Value Ref Range Status  02/03/2022 15.6 (H) 11.5 - 15.5 % Final  12/27/2020 12.7 11.6 - 15.4 % Final         Passed - CMP within normal limits and completed in the last 12 months    Albumin  Date Value Ref Range Status  02/06/2022 3.8 3.5 - 5.0 g/dL Final  09/20/2020 4.2 4.0 - 5.0 g/dL Final   Alkaline Phosphatase  Date Value Ref Range Status  02/06/2022 116 38 - 126 U/L Final   ALT  Date Value Ref Range Status  02/06/2022 49 (H) 0 - 44 U/L Final   AST  Date Value Ref Range Status  02/06/2022 74 (H) 15 - 41 U/L Final   BUN  Date Value Ref Range Status  02/06/2022 19 6 - 20 mg/dL Final  12/27/2020 13 6 - 24 mg/dL Final   Calcium  Date Value Ref Range Status  02/06/2022 9.7 8.9 - 10.3 mg/dL Final   Calcium, Ion  Date Value Ref Range Status  07/20/2020 1.02 (L) 1.15 - 1.40 mmol/L Final   CO2  Date Value Ref Range Status  02/06/2022 23 22 - 32 mmol/L Final   Bicarbonate  Date Value Ref Range Status  07/20/2020 28.9 (H) 20.0 - 28.0 mmol/L Final   TCO2  Date Value Ref Range Status  07/20/2020 30 22 - 32 mmol/L Final   Creat  Date Value Ref Range Status  09/13/2015 0.93 0.60 - 1.35 mg/dL Final   Creatinine, Ser  Date Value Ref  Range Status  02/06/2022 1.00 0.61 - 1.24 mg/dL Final   Creatinine, Urine  Date Value Ref Range Status  01/10/2019 72.85 mg/dL Final    Comment:    Performed at [facility], [place] [address] [place], [place] [address]   Glucose, Bld  Date Value Ref Range Status  02/06/2022 99 70 - 99 mg/dL Final    Comment:    Glucose reference range applies only to samples taken after fasting for at least 8 hours.   Glucose-Capillary  Date Value Ref Range Status  09/15/2021 114 (H) 70 - 99 mg/dL Final     Comment:    Glucose reference range applies only to samples taken after fasting for at least 8 hours.   Potassium  Date Value Ref Range Status  02/06/2022 4.1 3.5 - 5.1 mmol/L Final   Sodium  Date Value Ref Range Status  02/06/2022 139 135 - 145 mmol/L Final  12/27/2020 137 134 - 144 mmol/L Final   Total Bilirubin  Date Value Ref Range Status  02/06/2022 1.3 (H) 0.3 - 1.2 mg/dL Final   Bilirubin Total  Date Value Ref Range Status  09/20/2020 1.3 (H) 0.0 - 1.2 mg/dL Final   Bilirubin, Direct  Date Value Ref Range Status  07/19/2020 13.0 (H) 0.0 - 0.2 mg/dL Final    Comment:    RESULTS CONFIRMED BY MANUAL DILUTION   Indirect Bilirubin  Date Value Ref Range Status  07/19/2020 NOT CALCULATED 0.3 - 0.9 mg/dL Final    Comment:    Performed at [facility], [place] [address] [place], [place] [address]   Protein, ur  Date Value Ref Range Status  01/10/2022 100 (A) NEGATIVE mg/dL Final   Total Protein  Date Value Ref Range Status  02/06/2022 6.9 6.5 - 8.1 g/dL Final  09/20/2020 6.8 6.0 - 8.5 g/dL Final   GFR, Est African American  Date Value Ref Range Status  09/13/2015 >89 >=60 mL/min Final   GFR calc Af Amer  Date Value Ref Range Status  09/20/2020 88 >59 mL/min/1.73 Final    Comment:    **In accordance with recommendations from the NKF-ASN Task force,**   [facility] is in the process of updating its eGFR calculation to the   2021 CKD-EPI creatinine equation that estimates kidney function   without a race variable.    GFR  Date Value Ref Range Status  12/20/2020 93.70 >60.00 mL/min Final    Comment:    Calculated using the CKD-EPI Creatinine Equation (2021)   eGFR  Date Value Ref Range Status  12/27/2020 93 >59 mL/min/1.73 Final   GFR, Est Non African American  Date Value Ref Range Status  09/13/2015 >89 >=60 mL/min Final    Comment:      The estimated GFR is a calculation valid for adults (>=63 years old) that uses the CKD-EPI algorithm to adjust for  age and sex. It is   not to be used for children, pregnant women, hospitalized patients,    patients on dialysis, or with rapidly changing kidney function. According to the NKDEP, eGFR >89 is normal, 60-89 shows mild impairment, 30-59 shows moderate impairment, 15-29 shows severe impairment and <15 is ESRD.      GFR, Estimated  Date Value Ref Range Status  02/06/2022 >60 >60 mL/min Final    Comment:    (NOTE) Calculated using the CKD-EPI Creatinine Equation (2021)

## 2022-06-02 ENCOUNTER — Ambulatory Visit: Payer: Medicare Other | Admitting: Family Medicine

## 2022-06-04 MED ORDER — SPIRONOLACTONE 100 MG PO TABS: 100.0000 mg | ORAL_TABLET | Freq: Every day | ORAL | 0 refills | Status: DC

## 2022-06-04 MED ORDER — LEVETIRACETAM 500 MG PO TABS
500.0000 mg | ORAL_TABLET | Freq: Two times a day (BID) | ORAL | 0 refills | Status: DC
Start: 2022-06-04 — End: 2022-07-30

## 2022-06-11 ENCOUNTER — Other Ambulatory Visit: Payer: Self-pay | Admitting: Family Medicine

## 2022-06-11 DIAGNOSIS — F251 Schizoaffective disorder, depressive type: Secondary | ICD-10-CM

## 2022-07-30 ENCOUNTER — Encounter: Payer: Self-pay | Admitting: Family Medicine

## 2022-07-30 ENCOUNTER — Ambulatory Visit (INDEPENDENT_AMBULATORY_CARE_PROVIDER_SITE_OTHER): Payer: Medicare Other | Admitting: Family Medicine

## 2022-07-30 VITALS — BP 108/71 | HR 81 | Temp 98.1°F | Resp 16 | Wt 236.8 lb

## 2022-07-30 DIAGNOSIS — M792 Neuralgia and neuritis, unspecified: Secondary | ICD-10-CM

## 2022-07-30 DIAGNOSIS — K7031 Alcoholic cirrhosis of liver with ascites: Secondary | ICD-10-CM | POA: Diagnosis not present

## 2022-07-30 DIAGNOSIS — F251 Schizoaffective disorder, depressive type: Secondary | ICD-10-CM

## 2022-07-30 DIAGNOSIS — G40909 Epilepsy, unspecified, not intractable, without status epilepticus: Secondary | ICD-10-CM | POA: Diagnosis not present

## 2022-07-30 DIAGNOSIS — K219 Gastro-esophageal reflux disease without esophagitis: Secondary | ICD-10-CM

## 2022-07-30 MED ORDER — METOPROLOL SUCCINATE ER 25 MG PO TB24: 25.0000 mg | ORAL_TABLET | Freq: Every day | ORAL | 1 refills | Status: DC

## 2022-07-30 MED ORDER — QUETIAPINE FUMARATE 200 MG PO TABS: 200.0000 mg | ORAL_TABLET | Freq: Every day | ORAL | 1 refills | Status: DC

## 2022-07-30 MED ORDER — ALLOPURINOL 100 MG PO TABS: 100.0000 mg | ORAL_TABLET | Freq: Every day | ORAL | 1 refills | Status: DC

## 2022-07-30 MED ORDER — LACTULOSE 10 GM/15ML PO SOLN: 10.0000 g | Freq: Three times a day (TID) | ORAL | 1 refills | Status: DC

## 2022-07-30 MED ORDER — SPIRONOLACTONE 100 MG PO TABS: 100.0000 mg | ORAL_TABLET | Freq: Every day | ORAL | 1 refills | Status: DC

## 2022-07-30 MED ORDER — LEVETIRACETAM 500 MG PO TABS
500.0000 mg | ORAL_TABLET | Freq: Two times a day (BID) | ORAL | 1 refills | Status: DC
Start: 2022-07-30 — End: 2023-03-12

## 2022-07-30 MED ORDER — PANTOPRAZOLE SODIUM 40 MG PO TBEC: 40.0000 mg | DELAYED_RELEASE_TABLET | Freq: Every day | ORAL | 1 refills | Status: DC

## 2022-07-30 MED ORDER — GABAPENTIN 300 MG PO CAPS: 300.0000 mg | ORAL_CAPSULE | Freq: Three times a day (TID) | ORAL | 1 refills | Status: DC

## 2022-07-30 MED ORDER — SERTRALINE HCL 25 MG PO TABS: 25.0000 mg | ORAL_TABLET | Freq: Every day | ORAL | 1 refills | Status: DC

## 2022-07-31 LAB — CMP14+EGFR
ALT: 30 IU/L (ref 0–44)
AST: 29 IU/L (ref 0–40)
Albumin/Globulin Ratio: 2.1 (ref 1.2–2.2)
Albumin: 5.1 g/dL (ref 4.1–5.1)
Alkaline Phosphatase: 134 IU/L — ABNORMAL HIGH (ref 44–121)
BUN/Creatinine Ratio: 9 (ref 9–20)
BUN: 9 mg/dL (ref 6–24)
Bilirubin Total: 0.3 mg/dL (ref 0.0–1.2)
CO2: 19 mmol/L — ABNORMAL LOW (ref 20–29)
Calcium: 9.8 mg/dL (ref 8.7–10.2)
Chloride: 102 mmol/L (ref 96–106)
Creatinine, Ser: 1.02 mg/dL (ref 0.76–1.27)
Globulin, Total: 2.4 g/dL (ref 1.5–4.5)
Glucose: 73 mg/dL (ref 70–99)
Potassium: 4.7 mmol/L (ref 3.5–5.2)
Sodium: 137 mmol/L (ref 134–144)
Total Protein: 7.5 g/dL (ref 6.0–8.5)
eGFR: 90 mL/min/{1.73_m2} (ref >59)

## 2022-08-01 ENCOUNTER — Encounter: Payer: Self-pay | Admitting: Family Medicine

## 2022-08-01 NOTE — Progress Notes (Signed)
Estblished Patient Office Visit  Subjective    Patient ID: [name], male    DOB: [birth year]  Age: 52 y.o. MRN: [id number]  CC:  Chief Complaint  Patient presents with   Follow-up    HPI [name] presents for routine follow up of chronic med issues.    Outpatient Encounter Medications as of 07/30/2022  Medication Sig   allopurinol (ZYLOPRIM) 100 MG tablet Take 1 tablet (100 mg total) by mouth daily.   ibuprofen (ADVIL) 200 MG tablet Take 600 mg by mouth every 6 (six) hours as needed for headache or moderate pain.   ibuprofen (ADVIL) 600 MG tablet Take 1 tablet (600 mg total) by mouth every 8 (eight) hours as needed.   metoprolol tartrate (LOPRESSOR) 25 MG tablet Take 25 mg by mouth daily.   MILK THISTLE PO Take 1 capsule by mouth 2 (two) times daily.   Multiple Vitamin (MULTIVITAMIN WITH MINERALS) TABS tablet Take 1 tablet by mouth daily.   [DISCONTINUED] gabapentin (NEURONTIN) 300 MG capsule Take 1 capsule (300 mg total) by mouth 3 (three) times daily.   [DISCONTINUED] lactulose (CHRONULAC) 10 GM/15ML solution SMARTSIG:Milliliter(s) By Mouth   [DISCONTINUED] levETIRAcetam (KEPPRA) 500 MG tablet Take 1 tablet (500 mg total) by mouth 2 (two) times daily.   [DISCONTINUED] metoprolol succinate (TOPROL-XL) 25 MG 24 hr tablet Take 1 tablet (25 mg total) by mouth daily.   [DISCONTINUED] pantoprazole (PROTONIX) 40 MG tablet Take 1 tablet (40 mg total) by mouth daily.   [DISCONTINUED] QUEtiapine (SEROQUEL) 200 MG tablet TAKE 1 TABLET (200 MG TOTAL) BY MOUTH AT BEDTIME.   [DISCONTINUED] sertraline (ZOLOFT) 25 MG tablet TAKE 1 TABLET (25 MG TOTAL) BY MOUTH DAILY.   [DISCONTINUED] spironolactone (ALDACTONE) 100 MG tablet Take 1 tablet (100 mg total) by mouth daily.   gabapentin (NEURONTIN) 300 MG capsule Take 1 capsule (300 mg total) by mouth 3 (three) times daily.   hydrOXYzine (ATARAX) 25 MG tablet Take 1 tablet (25 mg total) by mouth every 6 (six) hours as needed for anxiety  (or CIWA score </= 10). (Patient not taking: Reported on 02/02/2022)   lactulose (CHRONULAC) 10 GM/15ML solution Take 15 mLs (10 g total) by mouth 3 (three) times daily.   levETIRAcetam (KEPPRA) 500 MG tablet Take 1 tablet (500 mg total) by mouth 2 (two) times daily.   metoprolol succinate (TOPROL-XL) 25 MG 24 hr tablet Take 1 tablet (25 mg total) by mouth daily.   nicotine (NICODERM CQ - DOSED IN MG/24 HOURS) 21 mg/24hr patch Place 1 patch (21 mg total) onto the skin daily. (Patient not taking: Reported on 02/02/2022)   ondansetron (ZOFRAN-ODT) 4 MG disintegrating tablet Take 1 tablet (4 mg total) by mouth every 8 (eight) hours as needed for nausea or vomiting. (Patient not taking: Reported on 02/02/2022)   pantoprazole (PROTONIX) 40 MG tablet Take 1 tablet (40 mg total) by mouth daily.   QUEtiapine (SEROQUEL) 200 MG tablet Take 1 tablet (200 mg total) by mouth at bedtime.   sertraline (ZOLOFT) 25 MG tablet Take 1 tablet (25 mg total) by mouth daily.   spironolactone (ALDACTONE) 100 MG tablet Take 1 tablet (100 mg total) by mouth daily.   thiamine 100 MG tablet Take 1 tablet (100 mg total) by mouth daily. (Patient not taking: Reported on 02/02/2022)   vitamin C (VITAMIN C) 250 MG tablet Take 1 tablet (250 mg total) by mouth 2 (two) times daily. (Patient not taking: Reported on 02/02/2022)   No facility-administered encounter medications on  file as of 07/30/2022.    Past Medical History:  Diagnosis Date   Alcohol abuse    Anxiety    at age 72   Blood transfusion without reported diagnosis    Cirrhosis ([place])    Depression    at age 45   GERD (gastroesophageal reflux disease)    Hypertension    Psychosis ([place])    PTSD (post-traumatic stress disorder)    Schizoaffective disorder    Seizure disorder ([place])    related to etoh seizure   Symptomatic anemia     Past Surgical History:  Procedure Laterality Date   ESOPHAGOGASTRODUODENOSCOPY (EGD) WITH PROPOFOL N/A 05/05/2020   Procedure:  ESOPHAGOGASTRODUODENOSCOPY (EGD) WITH PROPOFOL;  Surgeon: [name], MD;  Location: [facility];  Service: Endoscopy;  Laterality: N/A;   FOOT SURGERY     right   HAND SURGERY      Family History  Problem Relation Age of Onset   Alcoholism Mother    CAD Father    Depression Brother    Colon polyps Neg Hx    Esophageal cancer Neg Hx    Pancreatic cancer Neg Hx    Stomach cancer Neg Hx     Social History   Socioeconomic History   Marital status: Divorced    Spouse name: Not on file   Number of children: Not on file   Years of education: Not on file   Highest education level: Not on file  Occupational History   Not on file  Tobacco Use   Smoking status: Every Day    Packs/day: 1.50    Years: 30.00    Total pack years: 45.00    Types: Cigarettes   Smokeless tobacco: Never  Vaping Use   Vaping Use: Never used  Substance and Sexual Activity   Alcohol use: Yes    Comment: "until I pass out"   Drug use: Yes    Frequency: 1.0 times per week    Types: Marijuana   Sexual activity: Yes    Birth control/protection: Condom  Other Topics Concern   Not on file  Social History Narrative   Not on file   Social Determinants of Health   Financial Resource Strain: Not on file  Food Insecurity: No Food Insecurity (10/18/2019)   Hunger Vital Sign    Worried About Running Out of Food in the Last Year: Never true    Ran Out of Food in the Last Year: Never true  Transportation Needs: Unmet Transportation Needs (01/13/2022)   PRAPARE - Hydrologist (Medical): Yes    Lack of Transportation (Non-Medical): Yes  Physical Activity: Not on file  Stress: Not on file  Social Connections: Not on file  Intimate Partner Violence: Not on file    Review of Systems  All other systems reviewed and are negative.       Objective    BP 108/71   Pulse 81   Temp 98.1 F (36.7 C) (Oral)   Resp 16   Wt 236 lb 12.8 oz (107.4 kg)   SpO2 93%   BMI  32.12 kg/m   Physical Exam Vitals and nursing note reviewed.  Constitutional:      General: He is not in acute distress. Cardiovascular:     Rate and Rhythm: Normal rate and regular rhythm.  Pulmonary:     Effort: Pulmonary effort is normal.     Breath sounds: Normal breath sounds.  Abdominal:     Palpations: Abdomen  is soft.     Tenderness: There is no abdominal tenderness.  Neurological:     General: No focal deficit present.     Mental Status: He is alert and oriented to person, place, and time.         Assessment & Plan:   1. Seizure disorder ([place]) Meds refilled. Monitoring labs ordered.  - Levetiracetam level  2. Schizoaffective disorder, depressive type ([place]) Continue. Meds refilled.  - sertraline (ZOLOFT) 25 MG tablet; Take 1 tablet (25 mg total) by mouth daily.  Dispense: 90 tablet; Refill: 1  3. Alcoholic cirrhosis of liver with ascites ([place]) Referral to GI For further eval/mgt - spironolactone (ALDACTONE) 100 MG tablet; Take 1 tablet (100 mg total) by mouth daily.  Dispense: 90 tablet; Refill: 1 - CMP14+EGFR - Ambulatory referral to Gastroenterology  4. Neuropathic pain Neurontin refilled.  - gabapentin (NEURONTIN) 300 MG capsule; Take 1 capsule (300 mg total) by mouth 3 (three) times daily.  Dispense: 90 capsule; Refill: 1  5. Gastroesophageal reflux disease, unspecified whether esophagitis present Meds refilled.  - pantoprazole (PROTONIX) 40 MG tablet; Take 1 tablet (40 mg total) by mouth daily.  Dispense: 90 tablet; Refill: 1    Return in about 6 months (around 01/29/2023) for physical, follow up.   Becky Sax, MD

## 2022-08-15 ENCOUNTER — Telehealth: Payer: Self-pay | Admitting: Family Medicine

## 2022-08-15 NOTE — Telephone Encounter (Signed)
Left message for patient to call back and schedule Medicare Annual Wellness Visit (AWV) either virtually or phone  Left  my [name] number [phone]   awvi 09/18/14 per [facility]  please schedule with Nurse Health Adviser   45 min for awv-i and in office appointments 30 min for awv-s  phone/virtual appointments

## 2022-08-19 NOTE — Telephone Encounter (Signed)
Returned patient call.  Left message asking pt to call me [phone]  Patient returned my call 12/29

## 2022-09-15 ENCOUNTER — Ambulatory Visit: Payer: Medicare Other | Admitting: Physician Assistant

## 2022-09-15 ENCOUNTER — Encounter: Payer: Self-pay | Admitting: Physician Assistant

## 2022-09-15 VITALS — BP 118/80 | HR 78 | Ht 72.0 in | Wt 234.0 lb

## 2022-09-15 DIAGNOSIS — K7031 Alcoholic cirrhosis of liver with ascites: Secondary | ICD-10-CM

## 2022-09-15 MED ORDER — ALBUMIN HUMAN 25 % IV SOLN: 12.5000 g | Freq: Once | INTRAVENOUS | Status: DC

## 2022-09-15 NOTE — Patient Instructions (Addendum)
We will refer you back to [facility]. They will call you with an appt date and time.  You have been scheduled for an abdominal paracentesis at [facility] radiology (Entrance C) on 09/16/22 at 1:00pm. Please arrive at least 30 minutes prior to your appointment time for registration. Should you need to reschedule this appointment for any reason, please call our office at [phone].   The [facility] providers would like to encourage you to use [facility] to communicate with providers for non-urgent requests or questions.  Due to long hold times on the telephone, sending your provider a message by [facility] may be a faster and more efficient way to get a response.  Please allow 48 business hours for a response.  Please remember that this is for non-urgent requests.   Due to recent changes in healthcare laws, you may see the results of your imaging and laboratory studies on MyChart before your provider has had a chance to review them.  We understand that in some cases there may be results that are confusing or concerning to you. Not all laboratory results come back in the same time frame and the provider may be waiting for multiple results in order to interpret others.  Please give [place] 48 hours in order for your provider to thoroughly review all the results before contacting the office for clarification of your results.

## 2022-09-15 NOTE — Progress Notes (Signed)
Chief Complaint: Bloating/abdominal distention  HPI:   [name] is a 52 year old male with a past medical history as listed below including alcoholic cirrhosis and GERD, known to Dr. Silverio Decamp, who was referred to me by [name], MD for a complaint of bloating/abdominal distention.      05/05/2020 EGD with grade 1 varices in the lower third of the esophagus less than 5 mm in largest diameter, mild portal hypertensive gastropathy in the gastric fundus and gastric body and patchy mildly erythematous mucosa without active bleeding in the duodenal bulb.    12/20/2020 patient seen in clinic by Dr. Silverio Decamp for follow-up of his alcoholic cirrhosis, nausea and diarrhea.  At that point having some intermittent abdominal pain with nausea and vomiting.  At that point MELD score 6, had history of hepatic encephalopathy and was told to use Lactulose as needed.  Recommend that he be scheduled for a colonoscopy for colorectal cancer screening.  Also check an AFP.  He was referred to the liver clinic.    03/06/2021 colonoscopy with two 2-3 mm polyps in transverse colon and nonbleeding external and internal hemorrhoids.  Pathology showed tubular adenoma and repeat recommended in 7 years.    07/30/2022 CMP with minimally elevated alk phos at 134 and otherwise normal.    Today, the patient tells me he is not really sure why he is here.  When asked specifically he tells me he has noticed some abdominal distention.  He is still taking his Spironolactone 100 mg daily.  Does not recall ever being on Lasix.  Also continues Lactulose 45 mL a day and is having 2-4 bowel movements on a regular basis.  Also continues his Pantoprazole 40 daily.  Tells me he also notices some peripheral edema at times which seems to come and go.  He does not have any abdominal pain.  Does describe that he went to see the liver clinic in 1 point was supposed to follow-up but now it has been a little while and he would like a referral there.   Tells me at this point his last drink of alcohol was 6 months ago.    Denies fever, chills or weight loss.  Past Medical History:  Diagnosis Date   Alcohol abuse    Anxiety    at age 56   Blood transfusion without reported diagnosis    Cirrhosis ([place])    Depression    at age 59   GERD (gastroesophageal reflux disease)    Hypertension    Psychosis ([place])    PTSD (post-traumatic stress disorder)    Schizoaffective disorder    Seizure disorder ([place])    related to etoh seizure   Symptomatic anemia     Past Surgical History:  Procedure Laterality Date   ESOPHAGOGASTRODUODENOSCOPY (EGD) WITH PROPOFOL N/A 05/05/2020   Procedure: ESOPHAGOGASTRODUODENOSCOPY (EGD) WITH PROPOFOL;  Surgeon: [name], MD;  Location: [facility];  Service: Endoscopy;  Laterality: N/A;   FOOT SURGERY     right   HAND SURGERY      Current Outpatient Medications  Medication Sig Dispense Refill   allopurinol (ZYLOPRIM) 100 MG tablet Take 1 tablet (100 mg total) by mouth daily. 90 tablet 1   gabapentin (NEURONTIN) 300 MG capsule Take 1 capsule (300 mg total) by mouth 3 (three) times daily. 90 capsule 1   hydrOXYzine (ATARAX) 25 MG tablet Take 1 tablet (25 mg total) by mouth every 6 (six) hours as needed for anxiety (or CIWA score </= 10). (Patient  not taking: Reported on 02/02/2022) 60 tablet 1   ibuprofen (ADVIL) 200 MG tablet Take 600 mg by mouth every 6 (six) hours as needed for headache or moderate pain.     ibuprofen (ADVIL) 600 MG tablet Take 1 tablet (600 mg total) by mouth every 8 (eight) hours as needed. 30 tablet 0   lactulose (CHRONULAC) 10 GM/15ML solution Take 15 mLs (10 g total) by mouth 3 (three) times daily. 4050 mL 1   levETIRAcetam (KEPPRA) 500 MG tablet Take 1 tablet (500 mg total) by mouth 2 (two) times daily. 180 tablet 1   metoprolol succinate (TOPROL-XL) 25 MG 24 hr tablet Take 1 tablet (25 mg total) by mouth daily. 90 tablet 1   metoprolol tartrate (LOPRESSOR) 25 MG tablet Take  25 mg by mouth daily.     MILK THISTLE PO Take 1 capsule by mouth 2 (two) times daily.     Multiple Vitamin (MULTIVITAMIN WITH MINERALS) TABS tablet Take 1 tablet by mouth daily.     nicotine (NICODERM CQ - DOSED IN MG/24 HOURS) 21 mg/24hr patch Place 1 patch (21 mg total) onto the skin daily. (Patient not taking: Reported on 02/02/2022) 28 patch 0   ondansetron (ZOFRAN-ODT) 4 MG disintegrating tablet Take 1 tablet (4 mg total) by mouth every 8 (eight) hours as needed for nausea or vomiting. (Patient not taking: Reported on 02/02/2022) 20 tablet 0   pantoprazole (PROTONIX) 40 MG tablet Take 1 tablet (40 mg total) by mouth daily. 90 tablet 1   QUEtiapine (SEROQUEL) 200 MG tablet Take 1 tablet (200 mg total) by mouth at bedtime. 90 tablet 1   sertraline (ZOLOFT) 25 MG tablet Take 1 tablet (25 mg total) by mouth daily. 90 tablet 1   spironolactone (ALDACTONE) 100 MG tablet Take 1 tablet (100 mg total) by mouth daily. 90 tablet 1   thiamine 100 MG tablet Take 1 tablet (100 mg total) by mouth daily. (Patient not taking: Reported on 02/02/2022) 30 tablet 3   vitamin C (VITAMIN C) 250 MG tablet Take 1 tablet (250 mg total) by mouth 2 (two) times daily. (Patient not taking: Reported on 02/02/2022)     No current facility-administered medications for this visit.    Allergies as of 09/15/2022 - Review Complete 08/01/2022  Allergen Reaction Noted   Bupropion Other (See Comments) 09/12/2014    Family History  Problem Relation Age of Onset   Alcoholism Mother    CAD Father    Depression Brother    Colon polyps Neg Hx    Esophageal cancer Neg Hx    Pancreatic cancer Neg Hx    Stomach cancer Neg Hx     Social History   Socioeconomic History   Marital status: Divorced    Spouse name: Not on file   Number of children: Not on file   Years of education: Not on file   Highest education level: Not on file  Occupational History   Not on file  Tobacco Use   Smoking status: Every Day    Packs/day:  1.50    Years: 30.00    Total pack years: 45.00    Types: Cigarettes   Smokeless tobacco: Never  Vaping Use   Vaping Use: Never used  Substance and Sexual Activity   Alcohol use: Yes    Comment: "until I pass out"   Drug use: Yes    Frequency: 1.0 times per week    Types: Marijuana   Sexual activity: Yes    Birth  control/protection: Condom  Other Topics Concern   Not on file  Social History Narrative   Not on file   Social Determinants of Health   Financial Resource Strain: Not on file  Food Insecurity: No Food Insecurity (10/18/2019)   Hunger Vital Sign    Worried About Running Out of Food in the Last Year: Never true    Ran Out of Food in the Last Year: Never true  Transportation Needs: Unmet Transportation Needs (01/13/2022)   PRAPARE - Hydrologist (Medical): Yes    Lack of Transportation (Non-Medical): Yes  Physical Activity: Not on file  Stress: Not on file  Social Connections: Not on file  Intimate Partner Violence: Not on file    Review of Systems:    Constitutional: No weight loss, fever or chills Skin: No rash  Cardiovascular: No chest pain Respiratory: No SOB  Gastrointestinal: See HPI and otherwise negative Genitourinary: No dysuria  Neurological: No headache, dizziness or syncope Musculoskeletal: No new muscle or joint pain Hematologic: No bleeding  Psychiatric: No history of depression or anxiety   Physical Exam:  Vital signs: BP 118/80   Pulse 78   Ht 6' (1.829 m)   Wt 234 lb (106.1 kg)   BMI 31.74 kg/m    Constitutional:   Pleasant overweight Caucasian male appears to be in NAD, Well developed, Well nourished, alert and cooperative Head:  Normocephalic and atraumatic. Eyes:   PEERL, EOMI. No icterus. Conjunctiva pink. Ears:  Normal auditory acuity. Neck:  Supple Throat: Oral cavity and pharynx without inflammation, swelling or lesion.  Respiratory: Respirations even and unlabored. Lungs clear to auscultation  bilaterally.   No wheezes, crackles, or rhonchi.  Cardiovascular: Normal S1, S2. No MRG. Regular rate and rhythm. No peripheral edema, cyanosis or pallor.  Gastrointestinal:  Soft, mild distention with positive fluid wave, nontender. No rebound or guarding. Normal bowel sounds. No appreciable masses or hepatomegaly. Rectal:  Not performed.  Msk:  Symmetrical without gross deformities. Without edema, no deformity or joint abnormality.  Neurologic:  Alert and  oriented x4;  grossly normal neurologically.  Skin:   Dry and intact without significant lesions or rashes. Psychiatric: Demonstrates good judgement and reason without abnormal affect or behaviors.  RELEVANT LABS AND IMAGING: CBC    Component Value Date/Time   WBC 7.1 02/03/2022 0317   RBC 4.28 02/03/2022 0317   HGB 14.1 02/03/2022 0317   HGB 13.8 12/27/2020 0000   HCT 43.1 02/03/2022 0317   HCT 42.9 12/27/2020 0000   PLT 185 02/03/2022 0317   PLT 260 12/27/2020 0000   MCV 100.7 (H) 02/03/2022 0317   MCV 95 12/27/2020 0000   MCH 32.9 02/03/2022 0317   MCHC 32.7 02/03/2022 0317   RDW 15.6 (H) 02/03/2022 0317   RDW 12.7 12/27/2020 0000   LYMPHSABS 1.3 01/10/2022 0104   LYMPHSABS 2.6 01/25/2019 1404   MONOABS 1.2 (H) 01/10/2022 0104   EOSABS 0.0 01/10/2022 0104   EOSABS 0.1 01/25/2019 1404   BASOSABS 0.1 01/10/2022 0104   BASOSABS 0.1 01/25/2019 1404    CMP     Component Value Date/Time   NA 137 07/30/2022 1446   K 4.7 07/30/2022 1446   CL 102 07/30/2022 1446   CO2 19 (L) 07/30/2022 1446   GLUCOSE 73 07/30/2022 1446   GLUCOSE 99 02/06/2022 0406   BUN 9 07/30/2022 1446   CREATININE 1.02 07/30/2022 1446   CREATININE 0.93 09/13/2015 0946   CALCIUM 9.8 07/30/2022 1446  PROT 7.5 07/30/2022 1446   ALBUMIN 5.1 07/30/2022 1446   AST 29 07/30/2022 1446   ALT 30 07/30/2022 1446   ALKPHOS 134 (H) 07/30/2022 1446   BILITOT 0.3 07/30/2022 1446   GFRNONAA >60 02/06/2022 0406   GFRNONAA >89 09/13/2015 0946   GFRAA 88  09/20/2020 1450   GFRAA >89 09/13/2015 0946    Assessment: 1.  Decompensated alcoholic cirrhosis: With a complaint of ascites today and some peripheral edema which seems to come and go regardless of Spironolactone 100 mg daily, 6 months of alcohol abstinence at this point, previously followed with the liver clinic and wants to follow-up with them again  Plan: 1.  Referred patient to the liver clinic in town as he wishes to follow with them in regards to his cirrhosis. 2.  Martin Majestic ahead and ordered an ultrasound-guided paracentesis for ascites and for [place] screening.  We will send labs for cell count/differential, cytology, culture, Gram stain, protein 3.  Encouraged the patient to continue his Spironolactone 100 mg daily for now.  He may need to consider adding Lasix. 4.  Patient has not had an EGD for a couple of years, will likely need to repeat labs in the near future to decipher where to go from here but patient [facility] to [facility], if he does not get in there within the next month will need to repeat labs here. 5.  Continue Lactulose 45 mL 3 times daily 6.  Patient to follow in clinic with [place] per recommendations after above.  Ellouise Newer, PA-C [facility] 09/15/2022, 3:03 PM  Cc: [name], MD

## 2022-09-16 ENCOUNTER — Ambulatory Visit (HOSPITAL_COMMUNITY)
Admission: RE | Admit: 2022-09-16 | Discharge: 2022-09-16 | Disposition: A | Discharge status: Home / Self Care | Payer: Medicare Other | Source: Ambulatory Visit | Attending: Physician Assistant | Admitting: Physician Assistant

## 2022-09-16 ENCOUNTER — Other Ambulatory Visit: Payer: Self-pay | Admitting: Physician Assistant

## 2022-09-16 DIAGNOSIS — K7031 Alcoholic cirrhosis of liver with ascites: Secondary | ICD-10-CM

## 2022-09-16 NOTE — Procedures (Signed)
Patient presented to the [facility] IR department for possible paracentesis. Limited ultrasound examination of the abdomen revealed no ascites. Images saved in Epic. No procedure performed. Images were shown to patient and findings were discussed.   Dr. Earleen Newport made aware.  Soyla Dryer, [place] [phone] 09/16/2022, 1:27 PM

## 2022-09-30 ENCOUNTER — Other Ambulatory Visit: Payer: Self-pay | Admitting: Family Medicine

## 2022-09-30 NOTE — Telephone Encounter (Signed)
Medication Refill - Medication: Patient states lactulose ([place]) should be 45 ML 3x daily. Patient has enough for a few days but would like request expedited because his pharmacy is closed on Saturday.   Has the patient contacted their pharmacy? Yes.     Preferred Pharmacy (with phone number or street name):   [place], [address] Phone: [phone]  Fax: [phone]      Has the patient been seen for an appointment in the last year OR does the patient have an upcoming appointment? Yes.    Agent: Please be advised that RX refills may take up to 3 business days. We ask that you follow-up with your pharmacy.

## 2022-10-01 NOTE — Telephone Encounter (Signed)
Requested medication (s) are due for refill today - provider review   Requested medication (s) are on the active medication list -yes  Future visit scheduled -yes  Last refill: 07/30/22  Notes to clinic: Rx correction requested: Patient states lactulose [facility]) should be 45 ML 3x daily. Patient has enough for a few days but would like request expedited because his pharmacy is closed on Saturday.   Requested Prescriptions  Pending Prescriptions Disp Refills   lactulose (CHRONULAC) 10 GM/15ML solution 4050 mL 1    Sig: Take 15 mLs (10 g total) by mouth 3 (three) times daily.     Gastroenterology:  Laxatives - lactulose Failed - 09/30/2022  2:25 PM      Failed - CO2 in normal range and within 360 days    CO2  Date Value Ref Range Status  07/30/2022 19 (L) 20 - 29 mmol/L Final   Bicarbonate  Date Value Ref Range Status  07/20/2020 28.9 (H) 20.0 - 28.0 mmol/L Final         Passed - Cl in normal range and within 360 days    Chloride  Date Value Ref Range Status  07/30/2022 102 96 - 106 mmol/L Final         Passed - K in normal range and within 360 days    Potassium  Date Value Ref Range Status  07/30/2022 4.7 3.5 - 5.2 mmol/L Final         Passed - Na in normal range and within 360 days    Sodium  Date Value Ref Range Status  07/30/2022 137 134 - 144 mmol/L Final         Passed - Valid encounter within last 12 months    Recent Outpatient Visits           2 months ago Seizure disorder [facility])   [place] [facility], Clyde Canterbury, MD   6 months ago Chronic gout without tophus, unspecified cause, unspecified site   [facility] [place], MD   8 months ago Alcoholic cirrhosis of liver with ascites [facility])   [place] [facility], Cari S, PA-C   10 months ago Acute conjunctivitis of left eye, unspecified acute conjunctivitis type   [place] [facility], MD   1 year ago Trigger middle finger of right hand   [place] [facility], MD       Future Appointments             In 4 months [name], MD [facility]               Requested Prescriptions  Pending Prescriptions Disp Refills   lactulose (CHRONULAC) 10 GM/15ML solution 4050 mL 1    Sig: Take 15 mLs (10 g total) by mouth 3 (three) times daily.     Gastroenterology:  Laxatives - lactulose Failed - 09/30/2022  2:25 PM      Failed - CO2 in normal range and within 360 days    CO2  Date Value Ref Range Status  07/30/2022 19 (L) 20 - 29 mmol/L Final   Bicarbonate  Date Value Ref Range Status  07/20/2020 28.9 (H) 20.0 - 28.0 mmol/L Final         Passed - Cl in normal range and within 360 days    Chloride  Date Value  Ref Range Status  07/30/2022 102 96 - 106 mmol/L Final         Passed - K in normal range and within 360 days    Potassium  Date Value Ref Range Status  07/30/2022 4.7 3.5 - 5.2 mmol/L Final         Passed - Na in normal range and within 360 days    Sodium  Date Value Ref Range Status  07/30/2022 137 134 - 144 mmol/L Final         Passed - Valid encounter within last 12 months    Recent Outpatient Visits           2 months ago Seizure disorder [facility])   [place] [facility], MD   6 months ago Chronic gout without tophus, unspecified cause, unspecified site   [facility] [place], MD   8 months ago Alcoholic cirrhosis of liver with ascites [facility])   [place] [facility], Cari S, PA-C   10 months ago Acute conjunctivitis of left eye, unspecified acute conjunctivitis type   [place] [facility], MD   1 year ago Trigger middle finger of right hand   [place] [facility], MD       Future  Appointments             In 4 months [name], MD [facility]

## 2022-10-03 NOTE — Telephone Encounter (Addendum)
Pt is calling back requesting an update on his medication refill. Pt requesting a callback today.   Please advise.

## 2022-10-06 ENCOUNTER — Ambulatory Visit (INDEPENDENT_AMBULATORY_CARE_PROVIDER_SITE_OTHER): Payer: Medicare Other

## 2022-10-06 DIAGNOSIS — Z0000 Encounter for general adult medical examination without abnormal findings: Secondary | ICD-10-CM

## 2022-10-06 NOTE — Progress Notes (Signed)
Subjective:   [name] is a 52 y.o. male who presents for Medicare Annual/Subsequent preventive examination.  Review of Systems    Ref to Pcp      Objective:    There were no vitals filed for this visit. There is no height or weight on file to calculate BMI.     02/02/2022    3:20 AM 11/29/2021    1:15 AM 09/07/2021    5:42 AM 08/26/2021    4:58 AM 07/28/2021    6:58 AM 05/23/2021    1:38 PM 03/06/2021    2:43 PM  Advanced Directives  Does Patient Have a Medical Advance Directive? No No No No No No No  Would patient like information on creating a medical advance directive? No - Patient declined No - Patient declined No - Patient declined No - Patient declined No - Patient declined No - Patient declined No - Patient declined    Current Medications (verified) Outpatient Encounter Medications as of 10/06/2022  Medication Sig   allopurinol (ZYLOPRIM) 100 MG tablet Take 1 tablet (100 mg total) by mouth daily.   gabapentin (NEURONTIN) 300 MG capsule Take 1 capsule (300 mg total) by mouth 3 (three) times daily.   hydrOXYzine (ATARAX) 25 MG tablet Take 1 tablet (25 mg total) by mouth every 6 (six) hours as needed for anxiety (or CIWA score </= 10). (Patient not taking: Reported on 02/02/2022)   ibuprofen (ADVIL) 200 MG tablet Take 600 mg by mouth every 6 (six) hours as needed for headache or moderate pain.   ibuprofen (ADVIL) 600 MG tablet Take 1 tablet (600 mg total) by mouth every 8 (eight) hours as needed.   lactulose (CHRONULAC) 10 GM/15ML solution Take 15 mLs (10 g total) by mouth 3 (three) times daily. (Patient taking differently: Take 30 g by mouth 2 (two) times daily.)   levETIRAcetam (KEPPRA) 500 MG tablet Take 1 tablet (500 mg total) by mouth 2 (two) times daily.   metoprolol succinate (TOPROL-XL) 25 MG 24 hr tablet Take 1 tablet (25 mg total) by mouth daily.   metoprolol tartrate (LOPRESSOR) 25 MG tablet Take 25 mg by mouth daily.   MILK THISTLE PO Take 1 capsule by mouth 2  (two) times daily.   Multiple Vitamin (MULTIVITAMIN WITH MINERALS) TABS tablet Take 1 tablet by mouth daily. (Patient not taking: Reported on 09/15/2022)   nicotine (NICODERM CQ - DOSED IN MG/24 HOURS) 21 mg/24hr patch Place 1 patch (21 mg total) onto the skin daily. (Patient not taking: Reported on 02/02/2022)   ondansetron (ZOFRAN-ODT) 4 MG disintegrating tablet Take 1 tablet (4 mg total) by mouth every 8 (eight) hours as needed for nausea or vomiting.   pantoprazole (PROTONIX) 40 MG tablet Take 1 tablet (40 mg total) by mouth daily.   QUEtiapine (SEROQUEL) 200 MG tablet Take 1 tablet (200 mg total) by mouth at bedtime.   sertraline (ZOLOFT) 25 MG tablet Take 1 tablet (25 mg total) by mouth daily.   spironolactone (ALDACTONE) 100 MG tablet Take 1 tablet (100 mg total) by mouth daily.   thiamine 100 MG tablet Take 1 tablet (100 mg total) by mouth daily.   vitamin C (VITAMIN C) 250 MG tablet Take 1 tablet (250 mg total) by mouth 2 (two) times daily.   Facility-Administered Encounter Medications as of 10/06/2022  Medication   albumin human 25 % solution 12.5 g    Allergies (verified) Bupropion   History: Past Medical History:  Diagnosis Date   Alcohol abuse  Anxiety    at age 43   Blood transfusion without reported diagnosis    Cirrhosis ([place])    Depression    at age 34   GERD (gastroesophageal reflux disease)    Hypertension    Nausea and vomiting    Psychosis ([place])    PTSD (post-traumatic stress disorder)    Schizoaffective disorder    Seizure disorder ([place])    related to etoh seizure   Symptomatic anemia    Past Surgical History:  Procedure Laterality Date   ESOPHAGOGASTRODUODENOSCOPY (EGD) WITH PROPOFOL N/A 05/05/2020   Procedure: ESOPHAGOGASTRODUODENOSCOPY (EGD) WITH PROPOFOL;  Surgeon: [name], MD;  Location: [facility];  Service: Endoscopy;  Laterality: N/A;   FOOT SURGERY     right   HAND SURGERY     Family History  Problem Relation Age of Onset    Alcoholism Mother    CAD Father    Depression Brother    Colon polyps Neg Hx    Esophageal cancer Neg Hx    Pancreatic cancer Neg Hx    Stomach cancer Neg Hx    Social History   Socioeconomic History   Marital status: Divorced    Spouse name: Not on file   Number of children: Not on file   Years of education: Not on file   Highest education level: Not on file  Occupational History   Not on file  Tobacco Use   Smoking status: Every Day    Packs/day: 1.50    Years: 30.00    Total pack years: 45.00    Types: Cigarettes   Smokeless tobacco: Never  Vaping Use   Vaping Use: Never used  Substance and Sexual Activity   Alcohol use: Yes    Comment: "until I pass out"   Drug use: Yes    Frequency: 1.0 times per week    Types: Marijuana   Sexual activity: Yes    Birth control/protection: Condom  Other Topics Concern   Not on file  Social History Narrative   Not on file   Social Determinants of Health   Financial Resource Strain: Not on file  Food Insecurity: No Food Insecurity (10/18/2019)   Hunger Vital Sign    Worried About Running Out of Food in the Last Year: Never true    Ran Out of Food in the Last Year: Never true  Transportation Needs: Unmet Transportation Needs (01/13/2022)   PRAPARE - Hydrologist (Medical): Yes    Lack of Transportation (Non-Medical): Yes  Physical Activity: Not on file  Stress: Not on file  Social Connections: Not on file    Tobacco Counseling Ready to quit: Not Answered Counseling given: Not Answered   Clinical Intake:              How often do you need to have someone help you when you read instructions, pamphlets, or other written materials from your doctor or pharmacy?: (P) 1 - Never  Diabeticn/a         Activities of Daily Living    10/06/2022    4:31 AM 02/02/2022   11:00 PM  In your present state of health, do you have any difficulty performing the following activities:  Hearing? 0 0   Vision? 0 0  Difficulty concentrating or making decisions? 1 0  Walking or climbing stairs? 1 0  Dressing or bathing? 0 0  Doing errands, shopping? 1 0  Preparing Food and eating ? N   Using  the Toilet? N   In the past six months, have you accidently leaked urine? Y   Do you have problems with loss of bowel control? Y   Managing your Medications? N   Managing your Finances? N   Housekeeping or managing your Housekeeping? Y     Patient Care Team: [name], MD as PCP - General (Family Medicine)  Indicate any recent Medical Services you may have received from other than [facility] providers in the past year (date may be approximate).     Assessment:   This is a routine wellness examination for [name].  Hearing/Vision screen No results found.  Dietary issues and exercise activities discussed:     Goals Addressed   None   Depression Screen    01/15/2022    2:17 PM 05/15/2021   10:02 AM 12/24/2020    3:42 PM 09/20/2020    2:11 PM 10/18/2019    2:10 PM 01/26/2019    3:56 PM 06/22/2017    2:49 PM  PHQ 2/9 Scores  PHQ - 2 Score 3 2 0 1 2 2 2  $ PHQ- 9 Score 8 6 0 7 8 4 4    $ Fall Risk    10/06/2022    4:31 AM 12/24/2020    3:41 PM 09/20/2020    2:11 PM 10/18/2019    2:10 PM 02/16/2015   10:11 AM  Fall Risk   Falls in the past year? 1 1 1 1 $ Yes  Number falls in past yr: 0 1 1 0 1  Injury with Fall? 0 0 0 0 Yes  Risk Factor Category      High Fall Risk  Risk for fall due to :  History of fall(s);No Fall Risks History of fall(s) History of fall(s);Other (Comment) History of fall(s)  Risk for fall due to: Comment    Ongoing ETOH use   Follow up  Falls evaluation completed  Falls prevention discussed Education provided    [place]:  Any stairs in or around the home? No  If so, are there any without handrails? No  Home free of loose throw rugs in walkways, pet beds, electrical cords, etc? No  Adequate lighting in your home to reduce risk of falls?  Yes   ASSISTIVE DEVICES UTILIZED TO PREVENT FALLS:  Life alert? No  Use of a cane, walker or w/c? Yes  Grab bars in the bathroom? No  Shower chair or bench in shower? Yes  Elevated toilet seat or a handicapped toilet? No   TIMED UP AND GO:  Was the test performed? No .  Length of time to ambulate 10 feet:  sec.   Gait slow and steady without use of assistive device  Cognitive Function:        Immunizations Immunization History  Administered Date(s) Administered   Influenza,inj,Quad PF,6+ Mos 10/07/2019   PFIZER(Purple Top)SARS-COV-2 Vaccination 11/16/2019, 12/07/2019   Pneumococcal Polysaccharide-23 07/27/2020   Tdap 01/10/2019    TDAP status: Up to date  Flu Vaccine status: Up to date  Pneumococcal vaccine status: Declined,  Education has been provided regarding the importance of this vaccine but patient still declined. Advised may receive this vaccine at local pharmacy or Health Dept. Aware to provide a copy of the vaccination record if obtained from local pharmacy or Health Dept. Verbalized acceptance and understanding.   Covid-19 vaccine status: Declined, Education has been provided regarding the importance of this vaccine but patient still declined. Advised may receive this vaccine at  local pharmacy or Health Dept.or vaccine clinic. Aware to provide a copy of the vaccination record if obtained from local pharmacy or Health Dept. Verbalized acceptance and understanding.  Qualifies for Shingles Vaccine? No   Zostavax completed No   Shingrix Completed?: No.    Education has been provided regarding the importance of this vaccine. Patient has been advised to call insurance company to determine out of pocket expense if they have not yet received this vaccine. Advised may also receive vaccine at local pharmacy or Health Dept. Verbalized acceptance and understanding.  Screening Tests Health Maintenance  Topic Date Due   COVID-19 Vaccine (3 - 2023-24 season) 04/18/2022    INFLUENZA VACCINE  11/16/2022 (Originally 03/18/2022)   Medicare Annual Wellness (AWV)  10/07/2023   COLONOSCOPY (Pts 45-44yr Insurance coverage will need to be confirmed)  03/06/2028   DTaP/Tdap/Td (2 - Td or Tdap) 01/09/2029   Hepatitis C Screening  Completed   HIV Screening  Completed   HPV VACCINES  Aged Out    Health Maintenance  Health Maintenance Due  Topic Date Due   COVID-19 Vaccine (3 - 2023-24 season) 04/18/2022    Colorectal cancer screening: Type of screening: Colonoscopy. Completed 07/17/2020. Repeat every   years  Lung Cancer Screening: (Low Dose CT Chest recommended if Age 50-80years, 30 pack-year currently smoking OR have quit w/in 15years.) does qualify.   Lung Cancer Screening Referral: n/a  Additional Screening:  Hepatitis C Screening: does qualify; Completed 07/17/2020  Vision Screening: Recommended annual ophthalmology exams for early detection of glaucoma and other disorders of the eye. Is the patient up to date with their annual eye exam?  Yes  Who is the provider or what is the name of the office in which the patient attends annual eye exams? N/a If pt is not established with a provider, would they like to be referred to a provider to establish care? No .   Dental Screening: Recommended annual dental exams for proper oral hygiene  Community Resource Referral / Chronic Care Management: CRR required this visit?  No   CCM required this visit?  No      Plan:     I have personally reviewed and noted the following in the patient's chart:   Medical and social history Use of alcohol, tobacco or illicit drugs  Current medications and supplements including opioid prescriptions. Patient is not currently taking opioid prescriptions. Functional ability and status Nutritional status Physical activity Advanced directives List of other physicians Hospitalizations, surgeries, and ER visits in previous 12 months Vitals Screenings to include cognitive,  depression, and falls Referrals and appointments  In addition, I have reviewed and discussed with patient certain preventive protocols, quality metrics, and best practice recommendations. A written personalized care plan for preventive services as well as general preventive health recommendations were provided to patient.     AMelene Plan RMA   10/06/2022   Nurse Notes:

## 2022-10-07 ENCOUNTER — Telehealth: Payer: Self-pay | Admitting: Family Medicine

## 2022-10-07 ENCOUNTER — Other Ambulatory Visit: Payer: Self-pay | Admitting: Family Medicine

## 2022-10-07 MED ORDER — LACTULOSE 10 GM/15ML PO SOLN: 30.0000 g | Freq: Two times a day (BID) | ORAL | 0 refills | Status: DC

## 2022-10-07 NOTE — Telephone Encounter (Signed)
Pt requests that a new Rx for lactulose [facility]) be sent [id number] [place], [place] is [place] to be for 45 ML 3 times daily. Pt requests that the Rx be updated to reflect 45 ML dose 3 times daily.

## 2022-10-07 NOTE — Telephone Encounter (Signed)
Refill sent.

## 2022-12-01 ENCOUNTER — Other Ambulatory Visit: Payer: Self-pay | Admitting: Family Medicine

## 2023-02-04 ENCOUNTER — Encounter: Payer: Medicare Other | Admitting: Family Medicine

## 2023-03-04 ENCOUNTER — Emergency Department (HOSPITAL_COMMUNITY): Payer: Medicare Other

## 2023-03-04 ENCOUNTER — Other Ambulatory Visit: Payer: Self-pay

## 2023-03-04 ENCOUNTER — Inpatient Hospital Stay (HOSPITAL_COMMUNITY)
Admission: EM | Admit: 2023-03-04 | Discharge: 2023-03-12 | DRG: 641 | Disposition: A | Discharge status: Home Health | Payer: Medicare Other | Attending: Student | Admitting: Student

## 2023-03-04 ENCOUNTER — Encounter (HOSPITAL_COMMUNITY): Payer: Self-pay | Admitting: Emergency Medicine

## 2023-03-04 DIAGNOSIS — K219 Gastro-esophageal reflux disease without esophagitis: Secondary | ICD-10-CM | POA: Diagnosis present

## 2023-03-04 DIAGNOSIS — E559 Vitamin D deficiency, unspecified: Secondary | ICD-10-CM | POA: Diagnosis present

## 2023-03-04 DIAGNOSIS — F29 Unspecified psychosis not due to a substance or known physiological condition: Secondary | ICD-10-CM | POA: Diagnosis present

## 2023-03-04 DIAGNOSIS — E8770 Fluid overload, unspecified: Secondary | ICD-10-CM | POA: Diagnosis not present

## 2023-03-04 DIAGNOSIS — I10 Essential (primary) hypertension: Secondary | ICD-10-CM | POA: Diagnosis not present

## 2023-03-04 DIAGNOSIS — Z91148 Patient's other noncompliance with medication regimen for other reason: Secondary | ICD-10-CM

## 2023-03-04 DIAGNOSIS — Z6829 Body mass index (BMI) 29.0-29.9, adult: Secondary | ICD-10-CM

## 2023-03-04 DIAGNOSIS — K7031 Alcoholic cirrhosis of liver with ascites: Secondary | ICD-10-CM | POA: Diagnosis present

## 2023-03-04 DIAGNOSIS — F1010 Alcohol abuse, uncomplicated: Secondary | ICD-10-CM | POA: Diagnosis present

## 2023-03-04 DIAGNOSIS — S0990XA Unspecified injury of head, initial encounter: Secondary | ICD-10-CM | POA: Diagnosis not present

## 2023-03-04 DIAGNOSIS — E871 Hypo-osmolality and hyponatremia: Secondary | ICD-10-CM | POA: Diagnosis not present

## 2023-03-04 DIAGNOSIS — D649 Anemia, unspecified: Secondary | ICD-10-CM | POA: Insufficient documentation

## 2023-03-04 DIAGNOSIS — F411 Generalized anxiety disorder: Secondary | ICD-10-CM | POA: Diagnosis present

## 2023-03-04 DIAGNOSIS — R404 Transient alteration of awareness: Secondary | ICD-10-CM | POA: Diagnosis not present

## 2023-03-04 DIAGNOSIS — F251 Schizoaffective disorder, depressive type: Secondary | ICD-10-CM | POA: Diagnosis not present

## 2023-03-04 DIAGNOSIS — F4310 Post-traumatic stress disorder, unspecified: Secondary | ICD-10-CM | POA: Diagnosis not present

## 2023-03-04 DIAGNOSIS — F10130 Alcohol abuse with withdrawal, uncomplicated: Secondary | ICD-10-CM | POA: Diagnosis not present

## 2023-03-04 DIAGNOSIS — Z7141 Alcohol abuse counseling and surveillance of alcoholic: Secondary | ICD-10-CM

## 2023-03-04 DIAGNOSIS — F259 Schizoaffective disorder, unspecified: Secondary | ICD-10-CM | POA: Insufficient documentation

## 2023-03-04 DIAGNOSIS — K766 Portal hypertension: Secondary | ICD-10-CM | POA: Diagnosis not present

## 2023-03-04 DIAGNOSIS — Z6372 Alcoholism and drug addiction in family: Secondary | ICD-10-CM

## 2023-03-04 DIAGNOSIS — Z743 Need for continuous supervision: Secondary | ICD-10-CM | POA: Diagnosis not present

## 2023-03-04 DIAGNOSIS — E669 Obesity, unspecified: Secondary | ICD-10-CM | POA: Diagnosis present

## 2023-03-04 DIAGNOSIS — K7010 Alcoholic hepatitis without ascites: Secondary | ICD-10-CM | POA: Diagnosis present

## 2023-03-04 DIAGNOSIS — F10231 Alcohol dependence with withdrawal delirium: Secondary | ICD-10-CM | POA: Diagnosis not present

## 2023-03-04 DIAGNOSIS — E876 Hypokalemia: Secondary | ICD-10-CM | POA: Diagnosis not present

## 2023-03-04 DIAGNOSIS — Z888 Allergy status to other drugs, medicaments and biological substances status: Secondary | ICD-10-CM

## 2023-03-04 DIAGNOSIS — D638 Anemia in other chronic diseases classified elsewhere: Secondary | ICD-10-CM | POA: Diagnosis not present

## 2023-03-04 DIAGNOSIS — E878 Other disorders of electrolyte and fluid balance, not elsewhere classified: Secondary | ICD-10-CM | POA: Insufficient documentation

## 2023-03-04 DIAGNOSIS — Z87898 Personal history of other specified conditions: Secondary | ICD-10-CM

## 2023-03-04 DIAGNOSIS — S8991XA Unspecified injury of right lower leg, initial encounter: Secondary | ICD-10-CM | POA: Diagnosis not present

## 2023-03-04 DIAGNOSIS — G40909 Epilepsy, unspecified, not intractable, without status epilepticus: Secondary | ICD-10-CM | POA: Diagnosis present

## 2023-03-04 DIAGNOSIS — E873 Alkalosis: Secondary | ICD-10-CM | POA: Diagnosis not present

## 2023-03-04 DIAGNOSIS — M1A9XX0 Chronic gout, unspecified, without tophus (tophi): Secondary | ICD-10-CM | POA: Diagnosis not present

## 2023-03-04 DIAGNOSIS — T4276XA Underdosing of unspecified antiepileptic and sedative-hypnotic drugs, initial encounter: Secondary | ICD-10-CM | POA: Diagnosis present

## 2023-03-04 DIAGNOSIS — F10930 Alcohol use, unspecified with withdrawal, uncomplicated: Secondary | ICD-10-CM | POA: Diagnosis not present

## 2023-03-04 DIAGNOSIS — G629 Polyneuropathy, unspecified: Secondary | ICD-10-CM | POA: Diagnosis present

## 2023-03-04 DIAGNOSIS — K7460 Unspecified cirrhosis of liver: Secondary | ICD-10-CM

## 2023-03-04 DIAGNOSIS — S8001XA Contusion of right knee, initial encounter: Secondary | ICD-10-CM | POA: Diagnosis not present

## 2023-03-04 DIAGNOSIS — E8809 Other disorders of plasma-protein metabolism, not elsewhere classified: Secondary | ICD-10-CM | POA: Diagnosis present

## 2023-03-04 DIAGNOSIS — Z818 Family history of other mental and behavioral disorders: Secondary | ICD-10-CM

## 2023-03-04 DIAGNOSIS — W06XXXA Fall from bed, initial encounter: Secondary | ICD-10-CM | POA: Diagnosis present

## 2023-03-04 DIAGNOSIS — Z811 Family history of alcohol abuse and dependence: Secondary | ICD-10-CM

## 2023-03-04 DIAGNOSIS — E860 Dehydration: Secondary | ICD-10-CM | POA: Diagnosis present

## 2023-03-04 DIAGNOSIS — F10229 Alcohol dependence with intoxication, unspecified: Secondary | ICD-10-CM | POA: Diagnosis present

## 2023-03-04 DIAGNOSIS — D72829 Elevated white blood cell count, unspecified: Secondary | ICD-10-CM | POA: Diagnosis not present

## 2023-03-04 DIAGNOSIS — F17210 Nicotine dependence, cigarettes, uncomplicated: Secondary | ICD-10-CM | POA: Diagnosis present

## 2023-03-04 DIAGNOSIS — R17 Unspecified jaundice: Secondary | ICD-10-CM | POA: Insufficient documentation

## 2023-03-04 DIAGNOSIS — K7030 Alcoholic cirrhosis of liver without ascites: Secondary | ICD-10-CM | POA: Diagnosis present

## 2023-03-04 DIAGNOSIS — Y906 Blood alcohol level of 120-199 mg/100 ml: Secondary | ICD-10-CM | POA: Diagnosis present

## 2023-03-04 DIAGNOSIS — M792 Neuralgia and neuritis, unspecified: Secondary | ICD-10-CM

## 2023-03-04 DIAGNOSIS — Z79899 Other long term (current) drug therapy: Secondary | ICD-10-CM

## 2023-03-04 DIAGNOSIS — Z5982 Transportation insecurity: Secondary | ICD-10-CM

## 2023-03-04 DIAGNOSIS — R7401 Elevation of levels of liver transaminase levels: Secondary | ICD-10-CM | POA: Insufficient documentation

## 2023-03-04 MED ORDER — THIAMINE HCL 100 MG/ML IJ SOLN: 100.0000 mg | Freq: Every day | INTRAMUSCULAR | Status: DC

## 2023-03-04 MED ORDER — LORAZEPAM 2 MG/ML IJ SOLN: 1.0000 mg | INTRAMUSCULAR | Status: AC | PRN

## 2023-03-04 MED ORDER — FOLIC ACID 1 MG PO TABS
1.0000 mg | ORAL_TABLET | Freq: Every day | ORAL | Status: DC
  Administered 2023-03-05 – 2023-03-12 (×8): 1 mg via ORAL
  Filled 2023-03-04 (×8): qty 1

## 2023-03-04 MED ORDER — ADULT MULTIVITAMIN W/MINERALS CH: 1.0000 | ORAL_TABLET | Freq: Every day | ORAL | Status: DC

## 2023-03-04 MED ORDER — LORAZEPAM 1 MG PO TABS
1.0000 mg | ORAL_TABLET | ORAL | Status: AC | PRN
  Administered 2023-03-05: 1 mg via ORAL
  Filled 2023-03-04: qty 1

## 2023-03-04 MED ORDER — THIAMINE MONONITRATE 100 MG PO TABS
100.0000 mg | ORAL_TABLET | Freq: Every day | ORAL | Status: DC
  Administered 2023-03-05 – 2023-03-12 (×8): 100 mg via ORAL
  Filled 2023-03-04 (×8): qty 1

## 2023-03-04 MED ORDER — LEVETIRACETAM IN NACL 1500 MG/100ML IV SOLN
1500.0000 mg | Freq: Once | INTRAVENOUS | Status: AC
  Administered 2023-03-05: 1500 mg via INTRAVENOUS
  Filled 2023-03-04: qty 100

## 2023-03-04 NOTE — ED Provider Notes (Signed)
[place] EMERGENCY DEPARTMENT AT [facility] Provider Note   CSN: [id number] Arrival date & time: 03/04/23  2307     History  Chief Complaint  Patient presents with   Alcohol Intoxication    [name] is a 52 y.o. male.  Patient presents to the emergency department stating that he "feels bad".  Patient reports a history of liver cirrhosis, seizures, schizophrenia and alcoholism.  He reports that he had been sober for a couple of years, then started drinking again 1 month ago       Home Medications Prior to Admission medications   Medication Sig Start Date End Date Taking? Authorizing Provider  allopurinol (ZYLOPRIM) 100 MG tablet Take 1 tablet (100 mg total) by mouth daily. 07/30/22  Yes Georganna Skeans, MD  gabapentin (NEURONTIN) 300 MG capsule Take 1 capsule (300 mg total) by mouth 3 (three) times daily. 07/30/22  Yes Georganna Skeans, MD  ibuprofen (ADVIL) 200 MG tablet Take 600 mg by mouth every 6 (six) hours as needed for headache or moderate pain.   Yes [provider]  lactulose (CHRONULAC) 10 GM/15ML solution TAKE 45 MLS (30 G TOTAL) BY MOUTH 2 (TWO) TIMES DAILY. 12/03/22 03/13/23 Yes Georganna Skeans, MD  levETIRAcetam (KEPPRA) 500 MG tablet Take 1 tablet (500 mg total) by mouth 2 (two) times daily. 07/30/22  Yes Georganna Skeans, MD  metoprolol succinate (TOPROL-XL) 25 MG 24 hr tablet Take 1 tablet (25 mg total) by mouth daily. 07/30/22  Yes Georganna Skeans, MD  pantoprazole (PROTONIX) 40 MG tablet Take 1 tablet (40 mg total) by mouth daily. 07/30/22  Yes Georganna Skeans, MD  QUEtiapine (SEROQUEL) 200 MG tablet Take 1 tablet (200 mg total) by mouth at bedtime. 07/30/22  Yes Georganna Skeans, MD  sertraline (ZOLOFT) 25 MG tablet Take 1 tablet (25 mg total) by mouth daily. 07/30/22  Yes Georganna Skeans, MD  spironolactone (ALDACTONE) 100 MG tablet Take 1 tablet (100 mg total) by mouth daily. 07/30/22  Yes Georganna Skeans, MD      Allergies    Bupropion     Review of Systems   Review of Systems  Physical Exam Updated Vital Signs BP 128/73 (BP Location: Right Arm)   Pulse 86   Temp 98.3 F (36.8 C) (Oral)   Resp 18   Ht 6' (1.829 m)   Wt 99.8 kg   SpO2 95%   BMI 29.84 kg/m  Physical Exam  ED Results / Procedures / Treatments   Labs (all labs ordered are listed, but only abnormal results are displayed) Labs Reviewed  ETHANOL - Abnormal; Notable for the following components:      Result Value   Alcohol, Ethyl (B) 188 (*)    All other components within normal limits  SALICYLATE LEVEL - Abnormal; Notable for the following components:   Salicylate Lvl <7.0 (*)    All other components within normal limits  ACETAMINOPHEN LEVEL - Abnormal; Notable for the following components:   Acetaminophen (Tylenol), Serum <10 (*)    All other components within normal limits  AMMONIA - Abnormal; Notable for the following components:   Ammonia 43 (*)    All other components within normal limits  COMPREHENSIVE METABOLIC PANEL - Abnormal; Notable for the following components:   Sodium 115 (*)    Potassium <2.0 (*)    Chloride <65 (*)    Glucose, Bld 139 (*)    Calcium 5.1 (*)    Total Protein 6.1 (*)    Albumin 2.9 (*)  AST 150 (*)    ALT 86 (*)    Total Bilirubin 2.2 (*)    All other components within normal limits  CBC - Abnormal; Notable for the following components:   WBC 13.1 (*)    RBC 3.99 (*)    HCT 37.1 (*)    MCH 34.6 (*)    MCHC 37.2 (*)    RDW 18.6 (*)    nRBC 0.6 (*)    All other components within normal limits  COMPREHENSIVE METABOLIC PANEL - Abnormal; Notable for the following components:   Sodium 117 (*)    Potassium <2.0 (*)    Chloride <65 (*)    Glucose, Bld 155 (*)    Calcium 5.4 (*)    Total Protein 6.3 (*)    Albumin 3.0 (*)    AST 162 (*)    ALT 91 (*)    Alkaline Phosphatase 133 (*)    Total Bilirubin 2.1 (*)    All other components within normal limits  MAGNESIUM - Abnormal; Notable for the  following components:   Magnesium 0.8 (*)    All other components within normal limits  BASIC METABOLIC PANEL - Abnormal; Notable for the following components:   Sodium 119 (*)    Potassium <2.0 (*)    Chloride <65 (*)    Glucose, Bld 151 (*)    Calcium 5.2 (*)    All other components within normal limits  BASIC METABOLIC PANEL - Abnormal; Notable for the following components:   Sodium 121 (*)    Potassium <2.0 (*)    Chloride <65 (*)    Glucose, Bld 137 (*)    Calcium 6.4 (*)    All other components within normal limits  CBC - Abnormal; Notable for the following components:   WBC 13.2 (*)    RBC 4.12 (*)    HCT 38.1 (*)    MCHC 36.7 (*)    RDW 18.5 (*)    nRBC 0.6 (*)    All other components within normal limits  CALCIUM - Abnormal; Notable for the following components:   Calcium 6.3 (*)    All other components within normal limits  HEPATIC FUNCTION PANEL - Abnormal; Notable for the following components:   Albumin 3.3 (*)    AST 161 (*)    ALT 93 (*)    Alkaline Phosphatase 136 (*)    Total Bilirubin 2.3 (*)    Bilirubin, Direct 1.1 (*)    Indirect Bilirubin 1.2 (*)    All other components within normal limits  PROTIME-INR  LIPASE, BLOOD  NA AND K (SODIUM & POTASSIUM), RAND UR  OSMOLALITY, URINE  MAGNESIUM  OSMOLALITY  PHOSPHORUS  APTT  RAPID URINE DRUG SCREEN, HOSP PERFORMED  BASIC METABOLIC PANEL  BASIC METABOLIC PANEL  BASIC METABOLIC PANEL  BASIC METABOLIC PANEL  CALCIUM, IONIZED  HIV ANTIBODY (ROUTINE TESTING W REFLEX)  HEPATITIS PANEL, ACUTE  CALCIUM  CALCIUM  CALCIUM  CALCIUM  CALCIUM  VITAMIN D 25 HYDROXY (VIT D DEFICIENCY, FRACTURES)  URINALYSIS, ROUTINE W REFLEX MICROSCOPIC    EKG EKG Interpretation Date/Time:  Thursday March 05 2023 04:39:01 EDT Ventricular Rate:  92 PR Interval:  172 QRS Duration:  98 QT Interval:  451 QTC Calculation: 558 R Axis:   74  Text Interpretation: Sinus rhythm Minimal ST depression, lateral leads Prolonged  QT interval When compared with ECG of 09/07/2021, No significant change was found Confirmed by Dione Booze [phone]) on 03/05/2023 7:13:49 AM  Radiology [place]  ASCITES (ABDOMEN LIMITED)  Result Date: 03/05/2023 CLINICAL DATA:  [id number] Ascites [id number] EXAM: LIMITED ABDOMEN ULTRASOUND FOR ASCITES TECHNIQUE: Limited ultrasound survey for ascites was performed in all four abdominal quadrants. COMPARISON:  Ct abd/pelvis 08/26/2021, ultrasound abdomen 09/07/2021 FINDINGS: No ascites.  Hepatic steatosis. IMPRESSION: 1. No ascites. 2. Hepatic steatosis. Electronically Signed   By: Tish Frederickson M.D.   On: 03/05/2023 03:53   CT HEAD WO CONTRAST ( )  Result Date: 03/05/2023 CLINICAL DATA:  Head trauma, abnormal mental status. EXAM: CT HEAD WITHOUT CONTRAST TECHNIQUE: Contiguous axial images were obtained from the base of the skull through the vertex without intravenous contrast. RADIATION DOSE REDUCTION: This exam was performed according to the departmental dose-optimization program which includes automated exposure control, adjustment of the mA and/or kV according to patient size and/or use of iterative reconstruction technique. COMPARISON:  09/07/2021. FINDINGS: Brain: No acute intracranial hemorrhage, midline shift or mass effect. No extra-axial fluid collection. Gray-white matter differentiation is within normal limits. No hydrocephalus. Vascular: No hyperdense vessel or unexpected calcification. Skull: Normal. Negative for fracture or focal lesion. Sinuses/Orbits: A small mucosal retention cyst or polyp is noted in the right maxillary sinus. Debris is noted in the posterior nasopharynx. No acute orbital abnormality. Other: None. IMPRESSION: No acute intracranial process. Electronically Signed   By: Thornell Sartorius M.D.   On: 03/05/2023 01:41   DG Knee Complete 4 Views Right  Result Date: 03/05/2023 CLINICAL DATA:  Fall with right knee injury. EXAM: RIGHT KNEE - COMPLETE 4+ VIEW COMPARISON:  Radiographs 03/21/2011  FINDINGS: No evidence of fracture, dislocation, or joint effusion. No evidence of arthropathy or other focal bone abnormality. Soft tissues are unremarkable. IMPRESSION: Negative. Electronically Signed   By: Minerva Fester M.D.   On: 03/05/2023 00:24    Procedures Procedures    Medications Ordered in ED Medications  LORazepam (ATIVAN) tablet 1-4 mg (has no administration in time range)    Or  LORazepam (ATIVAN) injection 1-4 mg (has no administration in time range)  thiamine (VITAMIN B1) tablet 100 mg (has no administration in time range)    Or  thiamine (VITAMIN B1) injection 100 mg (has no administration in time range)  folic acid (FOLVITE) tablet 1 mg (has no administration in time range)  potassium chloride 10 mEq in 100 mL IVPB (10 mEq Intravenous New Bag/Given 03/05/23 0622)  allopurinol (ZYLOPRIM) tablet 100 mg (has no administration in time range)  multivitamin with minerals tablet 1 tablet (has no administration in time range)  enoxaparin (LOVENOX) injection 40 mg (40 mg Subcutaneous Given 03/05/23 0272)  acetaminophen (TYLENOL) tablet 650 mg (has no administration in time range)  ondansetron (ZOFRAN) injection 4 mg (4 mg Intravenous Given 03/05/23 0631)  chlordiazePOXIDE (LIBRIUM) capsule 25 mg (has no administration in time range)  hydrOXYzine (ATARAX) tablet 25 mg (has no administration in time range)  loperamide (IMODIUM) capsule 2-4 mg (has no administration in time range)  chlordiazePOXIDE (LIBRIUM) capsule 25 mg (25 mg Oral Given 03/05/23 0341)    Followed by  chlordiazePOXIDE (LIBRIUM) capsule 25 mg (has no administration in time range)    Followed by  chlordiazePOXIDE (LIBRIUM) capsule 25 mg (has no administration in time range)    Followed by  chlordiazePOXIDE (LIBRIUM) capsule 25 mg (has no administration in time range)  levETIRAcetam (KEPPRA) IVPB 500 mg/100 mL premix (0 mg Intravenous Stopped 03/05/23 0432)  lactulose (CHRONULAC) 10 GM/15ML solution 30 g (has no  administration in time range)  potassium chloride (KLOR-CON) packet 80 mEq (80 mEq  Oral Given 03/05/23 0623)  calcium gluconate 2 g/ 100 mL sodium chloride IVPB (has no administration in time range)  calcium gluconate 2 g/ 100 mL sodium chloride IVPB (has no administration in time range)  spironolactone (ALDACTONE) tablet 100 mg (100 mg Oral Given 03/05/23 0651)  potassium chloride 10 mEq in 100 mL IVPB (has no administration in time range)  levETIRAcetam (KEPPRA) IVPB 1500 mg/ 100 mL premix (0 mg Intravenous Stopped 03/05/23 0204)  magnesium sulfate IVPB 2 g 50 mL (0 g Intravenous Stopped 03/05/23 0309)  0.9 %  sodium chloride infusion ( Intravenous New Bag/Given 03/05/23 0219)  magnesium sulfate IVPB 2 g 50 mL (0 g Intravenous Stopped 03/05/23 0413)  calcium gluconate 2 g/ 100 mL sodium chloride IVPB (0 mg Intravenous Stopped 03/05/23 0436)  calcium gluconate 2 g/ 100 mL sodium chloride IVPB (0 mg Intravenous Stopped 03/05/23 0537)    ED Course/ Medical Decision Making/ A&P                             Medical Decision Making Amount and/or Complexity of Data Reviewed External Data Reviewed: labs, ECG and notes. Labs: ordered. Decision-making details documented in ED Course. Radiology: ordered and independent interpretation performed. Decision-making details documented in ED Course. ECG/medicine tests: ordered and independent interpretation performed. Decision-making details documented in ED Course.  Risk OTC drugs. Prescription drug management. Decision regarding hospitalization.   Differential diagnosis considered includes, but not limited to: TIA; Stroke; ICH; Seizure; electrolyte abnormality; hypoglycemia; toxic/pharmacologic causes; CNS infection; psychiatric disorder  Presents to the department stating that he feels bad.  Patient reports a history of alcoholism, recently started drinking again and has been drinking very heavily for the last month.  Patient appears ill at arrival.  He  is shaky and tachycardic.  Normal mentation.  No focal neurologic findings.  He reports that he fell out of bed yesterday due to his weakness.  CT head without acute findings.  His only injury from the fall was right knee.  X-ray negative.  Patient with a known history of seizure disorder.  It is felt that he is at very high risk for seizures as he has not been taking any of his medications for the last month and now is starting to withdraw from alcohol.  Given a bolus of IV Keppra and placed on CIWA protocol.  Lab work reveals profound electrolyte abnormalities.  Patient with severe hyponatremia, hypochloremia, hypokalemia, hypocalcemia.  Labs were drawn twice and confirmed.  Discussed with Dr. Gaynell Face, on-call for critical care.  She does not feel that the patient requires ICU management at this time.  Recommends admission to progressive bed by hospitalist service.  Patient found to be hypomagnesemic as well.  Started on IV magnesium followed by IV potassium and IV fluids.  CRITICAL CARE Performed by: Gilda Crease   Total critical care time: 35 minutes  Critical care time was exclusive of separately billable procedures and treating other patients.  Critical care was necessary to treat or prevent imminent or life-threatening deterioration.  Critical care was time spent personally by me on the following activities: development of treatment plan with patient and/or surrogate as well as nursing, discussions with consultants, evaluation of patient's response to treatment, examination of patient, obtaining history from patient or surrogate, ordering and performing treatments and interventions, ordering and review of laboratory studies, ordering and review of radiographic studies, pulse oximetry and re-evaluation of patient's condition.  Final Clinical Impression(s) / ED Diagnoses Final diagnoses:  Alcohol withdrawal syndrome without complication (HCC)  Hyponatremia   Hypokalemia  Hypomagnesemia    Rx / DC Orders ED Discharge Orders     None         Kandon Hosking, Canary Brim, MD 03/05/23 [phone]

## 2023-03-04 NOTE — ED Triage Notes (Addendum)
Pt from home via [facility]. States he has hx of cirrhosis, seizures, PTSD, and schizophrenia.  Reports "I was doing good but then went on a month long binge"  Pt states he drinks "four lokos" until he passes out and then when he wakes up he starts drinking again to repeat the process.  Has had some vomiting.  "Feels bad"  Pt states he has not taken his keppra or other meds for over a month.

## 2023-03-04 NOTE — ED Provider Notes (Incomplete)
[place] EMERGENCY DEPARTMENT AT [facility] Provider Note   CSN: [id number] Arrival date & time: 03/04/23  2307     History {Add pertinent medical, surgical, social history, OB history to HPI:1} Chief Complaint  Patient presents with  . Alcohol Intoxication    Karlton Maya is a 52 y.o. male.  Patient presents to the emergency department stating that he "feels bad".  Patient reports a history of liver cirrhosis, seizures, schizophrenia and alcoholism.  He reports that he had been sober for a couple of years, then started drinking again 1 month ago       Home Medications Prior to Admission medications   Medication Sig Start Date End Date Taking? Authorizing Provider  allopurinol (ZYLOPRIM) 100 MG tablet Take 1 tablet (100 mg total) by mouth daily. 07/30/22   Georganna Skeans, MD  gabapentin (NEURONTIN) 300 MG capsule Take 1 capsule (300 mg total) by mouth 3 (three) times daily. 07/30/22   Georganna Skeans, MD  hydrOXYzine (ATARAX) 25 MG tablet Take 1 tablet (25 mg total) by mouth every 6 (six) hours as needed for anxiety (or CIWA score </= 10). Patient not taking: Reported on 02/02/2022 01/15/22   Mayers, Cari S, PA-C  ibuprofen (ADVIL) 200 MG tablet Take 600 mg by mouth every 6 (six) hours as needed for headache or moderate pain.    [provider]  ibuprofen (ADVIL) 600 MG tablet Take 1 tablet (600 mg total) by mouth every 8 (eight) hours as needed. 03/06/22   Georganna Skeans, MD  lactulose (CHRONULAC) 10 GM/15ML solution TAKE 45 MLS (30 G TOTAL) BY MOUTH 2 (TWO) TIMES DAILY. 12/03/22 03/13/23  Georganna Skeans, MD  levETIRAcetam (KEPPRA) 500 MG tablet Take 1 tablet (500 mg total) by mouth 2 (two) times daily. 07/30/22   Georganna Skeans, MD  metoprolol succinate (TOPROL-XL) 25 MG 24 hr tablet Take 1 tablet (25 mg total) by mouth daily. 07/30/22   Georganna Skeans, MD  metoprolol tartrate (LOPRESSOR) 25 MG tablet Take 25 mg by mouth daily. 03/24/22   [provider]   MILK THISTLE PO Take 1 capsule by mouth 2 (two) times daily.    [provider]  Multiple Vitamin (MULTIVITAMIN WITH MINERALS) TABS tablet Take 1 tablet by mouth daily. Patient not taking: Reported on 09/15/2022 01/13/22   Rodolph Bong, MD  nicotine (NICODERM CQ - DOSED IN MG/24 HOURS) 21 mg/24hr patch Place 1 patch (21 mg total) onto the skin daily. Patient not taking: Reported on 02/02/2022 01/14/22   Rodolph Bong, MD  ondansetron (ZOFRAN-ODT) 4 MG disintegrating tablet Take 1 tablet (4 mg total) by mouth every 8 (eight) hours as needed for nausea or vomiting. 01/13/22   Rodolph Bong, MD  pantoprazole (PROTONIX) 40 MG tablet Take 1 tablet (40 mg total) by mouth daily. 07/30/22   Georganna Skeans, MD  QUEtiapine (SEROQUEL) 200 MG tablet Take 1 tablet (200 mg total) by mouth at bedtime. 07/30/22   Georganna Skeans, MD  sertraline (ZOLOFT) 25 MG tablet Take 1 tablet (25 mg total) by mouth daily. 07/30/22   Georganna Skeans, MD  spironolactone (ALDACTONE) 100 MG tablet Take 1 tablet (100 mg total) by mouth daily. 07/30/22   Georganna Skeans, MD  thiamine 100 MG tablet Take 1 tablet (100 mg total) by mouth daily. 01/13/22   Rodolph Bong, MD  vitamin C (VITAMIN C) 250 MG tablet Take 1 tablet (250 mg total) by mouth 2 (two) times daily. 08/06/20   Lewie Chamber, MD  Allergies    Bupropion    Review of Systems   Review of Systems  Physical Exam Updated Vital Signs BP 105/77 (BP Location: Left Arm)   Pulse (!) 108   Temp 97.8 F (36.6 C)   Resp 16   SpO2 96%  Physical Exam  ED Results / Procedures / Treatments   Labs (all labs ordered are listed, but only abnormal results are displayed) Labs Reviewed  COMPREHENSIVE METABOLIC PANEL  ETHANOL  SALICYLATE LEVEL  ACETAMINOPHEN LEVEL  CBC  RAPID URINE DRUG SCREEN, HOSP PERFORMED  LIPASE, BLOOD  AMMONIA  PROTIME-INR    EKG None  Radiology No results found.  Procedures Procedures  {Document cardiac  monitor, telemetry assessment procedure when appropriate:1}  Medications Ordered in ED Medications - No data to display  ED Course/ Medical Decision Making/ A&P   {   Click here for ABCD2, HEART and other calculatorsREFRESH Note before signing :1}                          Medical Decision Making Amount and/or Complexity of Data Reviewed Labs: ordered. Radiology: ordered.   ***  {Document critical care time when appropriate:1} {Document review of labs and clinical decision tools ie heart score, Chads2Vasc2 etc:1}  {Document your independent review of radiology images, and any outside records:1} {Document your discussion with family members, caretakers, and with consultants:1} {Document social determinants of health affecting pt's care:1} {Document your decision making why or why not admission, treatments were needed:1} Final Clinical Impression(s) / ED Diagnoses Final diagnoses:  None    Rx / DC Orders ED Discharge Orders     None

## 2023-03-05 ENCOUNTER — Inpatient Hospital Stay (HOSPITAL_COMMUNITY): Payer: Medicare Other

## 2023-03-05 ENCOUNTER — Emergency Department (HOSPITAL_COMMUNITY): Payer: Medicare Other

## 2023-03-05 DIAGNOSIS — F259 Schizoaffective disorder, unspecified: Secondary | ICD-10-CM | POA: Diagnosis present

## 2023-03-05 DIAGNOSIS — R7401 Elevation of levels of liver transaminase levels: Secondary | ICD-10-CM | POA: Diagnosis not present

## 2023-03-05 DIAGNOSIS — K7030 Alcoholic cirrhosis of liver without ascites: Secondary | ICD-10-CM

## 2023-03-05 DIAGNOSIS — K7010 Alcoholic hepatitis without ascites: Secondary | ICD-10-CM | POA: Diagnosis present

## 2023-03-05 DIAGNOSIS — F251 Schizoaffective disorder, depressive type: Secondary | ICD-10-CM | POA: Diagnosis not present

## 2023-03-05 DIAGNOSIS — S0990XA Unspecified injury of head, initial encounter: Secondary | ICD-10-CM | POA: Diagnosis not present

## 2023-03-05 DIAGNOSIS — K766 Portal hypertension: Secondary | ICD-10-CM | POA: Diagnosis present

## 2023-03-05 DIAGNOSIS — R188 Other ascites: Secondary | ICD-10-CM | POA: Diagnosis not present

## 2023-03-05 DIAGNOSIS — E878 Other disorders of electrolyte and fluid balance, not elsewhere classified: Secondary | ICD-10-CM | POA: Diagnosis present

## 2023-03-05 DIAGNOSIS — D72829 Elevated white blood cell count, unspecified: Secondary | ICD-10-CM | POA: Diagnosis present

## 2023-03-05 DIAGNOSIS — E876 Hypokalemia: Secondary | ICD-10-CM

## 2023-03-05 DIAGNOSIS — E8809 Other disorders of plasma-protein metabolism, not elsewhere classified: Secondary | ICD-10-CM | POA: Diagnosis present

## 2023-03-05 DIAGNOSIS — E8770 Fluid overload, unspecified: Secondary | ICD-10-CM | POA: Diagnosis present

## 2023-03-05 DIAGNOSIS — M1A9XX0 Chronic gout, unspecified, without tophus (tophi): Secondary | ICD-10-CM | POA: Diagnosis present

## 2023-03-05 DIAGNOSIS — K760 Fatty (change of) liver, not elsewhere classified: Secondary | ICD-10-CM | POA: Diagnosis not present

## 2023-03-05 DIAGNOSIS — E872 Acidosis, unspecified: Secondary | ICD-10-CM | POA: Diagnosis not present

## 2023-03-05 DIAGNOSIS — Z87898 Personal history of other specified conditions: Secondary | ICD-10-CM

## 2023-03-05 DIAGNOSIS — E669 Obesity, unspecified: Secondary | ICD-10-CM | POA: Diagnosis present

## 2023-03-05 DIAGNOSIS — K7460 Unspecified cirrhosis of liver: Secondary | ICD-10-CM

## 2023-03-05 DIAGNOSIS — F10229 Alcohol dependence with intoxication, unspecified: Secondary | ICD-10-CM | POA: Diagnosis present

## 2023-03-05 DIAGNOSIS — D649 Anemia, unspecified: Secondary | ICD-10-CM | POA: Insufficient documentation

## 2023-03-05 DIAGNOSIS — S8991XA Unspecified injury of right lower leg, initial encounter: Secondary | ICD-10-CM | POA: Diagnosis not present

## 2023-03-05 DIAGNOSIS — S8001XA Contusion of right knee, initial encounter: Secondary | ICD-10-CM | POA: Diagnosis present

## 2023-03-05 DIAGNOSIS — E559 Vitamin D deficiency, unspecified: Secondary | ICD-10-CM | POA: Diagnosis present

## 2023-03-05 DIAGNOSIS — R17 Unspecified jaundice: Secondary | ICD-10-CM | POA: Insufficient documentation

## 2023-03-05 DIAGNOSIS — I10 Essential (primary) hypertension: Secondary | ICD-10-CM | POA: Diagnosis present

## 2023-03-05 DIAGNOSIS — R112 Nausea with vomiting, unspecified: Secondary | ICD-10-CM | POA: Diagnosis not present

## 2023-03-05 DIAGNOSIS — F10231 Alcohol dependence with withdrawal delirium: Secondary | ICD-10-CM | POA: Diagnosis not present

## 2023-03-05 DIAGNOSIS — F10930 Alcohol use, unspecified with withdrawal, uncomplicated: Secondary | ICD-10-CM | POA: Diagnosis not present

## 2023-03-05 DIAGNOSIS — W06XXXA Fall from bed, initial encounter: Secondary | ICD-10-CM | POA: Diagnosis present

## 2023-03-05 DIAGNOSIS — K219 Gastro-esophageal reflux disease without esophagitis: Secondary | ICD-10-CM | POA: Diagnosis present

## 2023-03-05 DIAGNOSIS — Y906 Blood alcohol level of 120-199 mg/100 ml: Secondary | ICD-10-CM | POA: Diagnosis present

## 2023-03-05 DIAGNOSIS — E871 Hypo-osmolality and hyponatremia: Secondary | ICD-10-CM

## 2023-03-05 DIAGNOSIS — D638 Anemia in other chronic diseases classified elsewhere: Secondary | ICD-10-CM | POA: Diagnosis present

## 2023-03-05 DIAGNOSIS — F10130 Alcohol abuse with withdrawal, uncomplicated: Secondary | ICD-10-CM | POA: Diagnosis not present

## 2023-03-05 DIAGNOSIS — G40909 Epilepsy, unspecified, not intractable, without status epilepticus: Secondary | ICD-10-CM | POA: Diagnosis present

## 2023-03-05 DIAGNOSIS — F4310 Post-traumatic stress disorder, unspecified: Secondary | ICD-10-CM | POA: Diagnosis not present

## 2023-03-05 DIAGNOSIS — F1010 Alcohol abuse, uncomplicated: Secondary | ICD-10-CM | POA: Diagnosis not present

## 2023-03-05 DIAGNOSIS — E873 Alkalosis: Secondary | ICD-10-CM | POA: Diagnosis present

## 2023-03-05 LAB — BASIC METABOLIC PANEL
Anion gap: 14 (ref 5–15)
BUN: 13 mg/dL (ref 6–20)
BUN: 14 mg/dL (ref 6–20)
BUN: 12 mg/dL (ref 6–20)
CO2: 32 mmol/L (ref 22–32)
CO2: 31 mmol/L (ref 22–32)
CO2: 36 mmol/L — ABNORMAL HIGH (ref 22–32)
Calcium: 5.2 mg/dL — CL (ref 8.9–10.3)
Calcium: 6.4 mg/dL — CL (ref 8.9–10.3)
Calcium: 9.1 mg/dL (ref 8.9–10.3)
Chloride: <65 mmol/L — CL (ref 98–111)
Chloride: <65 mmol/L — CL (ref 98–111)
Chloride: 69 mmol/L — ABNORMAL LOW (ref 98–111)
Creatinine, Ser: 0.93 mg/dL (ref 0.61–1.24)
Creatinine, Ser: 0.98 mg/dL (ref 0.61–1.24)
Creatinine, Ser: 0.95 mg/dL (ref 0.61–1.24)
GFR, Estimated: >60 mL/min (ref >60)
GFR, Estimated: >60 mL/min (ref >60)
GFR, Estimated: >60 mL/min (ref >60)
Glucose, Bld: 151 mg/dL — ABNORMAL HIGH (ref 70–99)
Glucose, Bld: 137 mg/dL — ABNORMAL HIGH (ref 70–99)
Glucose, Bld: 126 mg/dL — ABNORMAL HIGH (ref 70–99)
Potassium: <2 mmol/L — CL (ref 3.5–5.1)
Potassium: <2 mmol/L — CL (ref 3.5–5.1)
Potassium: 2.2 mmol/L — CL (ref 3.5–5.1)
Sodium: 119 mmol/L — CL (ref 135–145)
Sodium: 121 mmol/L — ABNORMAL LOW (ref 135–145)
Sodium: 119 mmol/L — CL (ref 135–145)

## 2023-03-05 LAB — HEPATIC FUNCTION PANEL
ALT: 93 U/L — ABNORMAL HIGH (ref 0–44)
AST: 161 U/L — ABNORMAL HIGH (ref 15–41)
Albumin: 3.3 g/dL — ABNORMAL LOW (ref 3.5–5.0)
Alkaline Phosphatase: 136 U/L — ABNORMAL HIGH (ref 38–126)
Bilirubin, Direct: 1.1 mg/dL — ABNORMAL HIGH (ref 0.0–0.2)
Indirect Bilirubin: 1.2 mg/dL — ABNORMAL HIGH (ref 0.3–0.9)
Total Bilirubin: 2.3 mg/dL — ABNORMAL HIGH (ref 0.3–1.2)
Total Protein: 6.9 g/dL (ref 6.5–8.1)

## 2023-03-05 LAB — RENAL FUNCTION PANEL
Albumin: 2.6 g/dL — ABNORMAL LOW (ref 3.5–5.0)
Albumin: 2.6 g/dL — ABNORMAL LOW (ref 3.5–5.0)
Albumin: 2.6 g/dL — ABNORMAL LOW (ref 3.5–5.0)
Anion gap: 15 (ref 5–15)
Anion gap: 14 (ref 5–15)
Anion gap: 12 (ref 5–15)
BUN: 12 mg/dL (ref 6–20)
BUN: 12 mg/dL (ref 6–20)
BUN: 11 mg/dL (ref 6–20)
CO2: 38 mmol/L — ABNORMAL HIGH (ref 22–32)
CO2: 36 mmol/L — ABNORMAL HIGH (ref 22–32)
CO2: 38 mmol/L — ABNORMAL HIGH (ref 22–32)
Calcium: 7.2 mg/dL — ABNORMAL LOW (ref 8.9–10.3)
Calcium: 7 mg/dL — ABNORMAL LOW (ref 8.9–10.3)
Calcium: 7 mg/dL — ABNORMAL LOW (ref 8.9–10.3)
Chloride: 69 mmol/L — ABNORMAL LOW (ref 98–111)
Chloride: 73 mmol/L — ABNORMAL LOW (ref 98–111)
Chloride: 74 mmol/L — ABNORMAL LOW (ref 98–111)
Creatinine, Ser: 0.94 mg/dL (ref 0.61–1.24)
Creatinine, Ser: 0.88 mg/dL (ref 0.61–1.24)
Creatinine, Ser: 0.91 mg/dL (ref 0.61–1.24)
GFR, Estimated: >60 mL/min (ref >60)
GFR, Estimated: >60 mL/min (ref >60)
GFR, Estimated: >60 mL/min (ref >60)
Glucose, Bld: 139 mg/dL — ABNORMAL HIGH (ref 70–99)
Glucose, Bld: 122 mg/dL — ABNORMAL HIGH (ref 70–99)
Glucose, Bld: 117 mg/dL — ABNORMAL HIGH (ref 70–99)
Phosphorus: 4.1 mg/dL (ref 2.5–4.6)
Phosphorus: 4 mg/dL (ref 2.5–4.6)
Phosphorus: 4.1 mg/dL (ref 2.5–4.6)
Potassium: 2.1 mmol/L — CL (ref 3.5–5.1)
Potassium: 2 mmol/L — CL (ref 3.5–5.1)
Potassium: 2.1 mmol/L — CL (ref 3.5–5.1)
Sodium: 122 mmol/L — ABNORMAL LOW (ref 135–145)
Sodium: 123 mmol/L — ABNORMAL LOW (ref 135–145)
Sodium: 124 mmol/L — ABNORMAL LOW (ref 135–145)

## 2023-03-05 LAB — CBC
HCT: 37.1 % — ABNORMAL LOW (ref 39.0–52.0)
HCT: 38.1 % — ABNORMAL LOW (ref 39.0–52.0)
Hemoglobin: 13.8 g/dL (ref 13.0–17.0)
Hemoglobin: 14 g/dL (ref 13.0–17.0)
MCH: 34 pg (ref 26.0–34.0)
MCH: 34.6 pg — ABNORMAL HIGH (ref 26.0–34.0)
MCHC: 36.7 g/dL — ABNORMAL HIGH (ref 30.0–36.0)
MCHC: 37.2 g/dL — ABNORMAL HIGH (ref 30.0–36.0)
MCV: 92.5 fL (ref 80.0–100.0)
MCV: 93 fL (ref 80.0–100.0)
Platelets: 315 10*3/uL (ref 150–400)
Platelets: 318 10*3/uL (ref 150–400)
RBC: 3.99 MIL/uL — ABNORMAL LOW (ref 4.22–5.81)
RBC: 4.12 MIL/uL — ABNORMAL LOW (ref 4.22–5.81)
RDW: 18.5 % — ABNORMAL HIGH (ref 11.5–15.5)
RDW: 18.6 % — ABNORMAL HIGH (ref 11.5–15.5)
WBC: 13.1 10*3/uL — ABNORMAL HIGH (ref 4.0–10.5)
WBC: 13.2 10*3/uL — ABNORMAL HIGH (ref 4.0–10.5)
nRBC: 0.6 % — ABNORMAL HIGH (ref 0.0–0.2)
nRBC: 0.6 % — ABNORMAL HIGH (ref 0.0–0.2)

## 2023-03-05 LAB — COMPREHENSIVE METABOLIC PANEL
ALT: 86 U/L — ABNORMAL HIGH (ref 0–44)
ALT: 91 U/L — ABNORMAL HIGH (ref 0–44)
AST: 150 U/L — ABNORMAL HIGH (ref 15–41)
AST: 162 U/L — ABNORMAL HIGH (ref 15–41)
Albumin: 2.9 g/dL — ABNORMAL LOW (ref 3.5–5.0)
Albumin: 3 g/dL — ABNORMAL LOW (ref 3.5–5.0)
Alkaline Phosphatase: 117 U/L (ref 38–126)
Alkaline Phosphatase: 133 U/L — ABNORMAL HIGH (ref 38–126)
BUN: 13 mg/dL (ref 6–20)
BUN: 13 mg/dL (ref 6–20)
CO2: 31 mmol/L (ref 22–32)
CO2: 29 mmol/L (ref 22–32)
Calcium: 5.1 mg/dL — CL (ref 8.9–10.3)
Calcium: 5.4 mg/dL — CL (ref 8.9–10.3)
Chloride: <65 mmol/L — CL (ref 98–111)
Chloride: <65 mmol/L — CL (ref 98–111)
Creatinine, Ser: 1.03 mg/dL (ref 0.61–1.24)
Creatinine, Ser: 1 mg/dL (ref 0.61–1.24)
GFR, Estimated: >60 mL/min (ref >60)
GFR, Estimated: >60 mL/min (ref >60)
Glucose, Bld: 139 mg/dL — ABNORMAL HIGH (ref 70–99)
Glucose, Bld: 155 mg/dL — ABNORMAL HIGH (ref 70–99)
Potassium: <2 mmol/L — CL (ref 3.5–5.1)
Potassium: <2 mmol/L — CL (ref 3.5–5.1)
Sodium: 115 mmol/L — CL (ref 135–145)
Sodium: 117 mmol/L — CL (ref 135–145)
Total Bilirubin: 2.2 mg/dL — ABNORMAL HIGH (ref 0.3–1.2)
Total Bilirubin: 2.1 mg/dL — ABNORMAL HIGH (ref 0.3–1.2)
Total Protein: 6.1 g/dL — ABNORMAL LOW (ref 6.5–8.1)
Total Protein: 6.3 g/dL — ABNORMAL LOW (ref 6.5–8.1)

## 2023-03-05 LAB — HEPATITIS PANEL, ACUTE
HCV Ab: NONREACTIVE
Hep A IgM: NONREACTIVE
Hep B C IgM: NONREACTIVE
Hepatitis B Surface Ag: NONREACTIVE

## 2023-03-05 LAB — PHOSPHORUS: Phosphorus: 3.6 mg/dL (ref 2.5–4.6)

## 2023-03-05 LAB — OSMOLALITY: Osmolality: 277 mOsm/kg (ref 275–295)

## 2023-03-05 LAB — VITAMIN D 25 HYDROXY (VIT D DEFICIENCY, FRACTURES): Vit D, 25-Hydroxy: 5.63 ng/mL — ABNORMAL LOW (ref 30–100)

## 2023-03-05 LAB — MAGNESIUM
Magnesium: 0.8 mg/dL — CL (ref 1.7–2.4)
Magnesium: 1.8 mg/dL (ref 1.7–2.4)

## 2023-03-05 LAB — PROTIME-INR
INR: 1.1 (ref 0.8–1.2)
Prothrombin Time: 14.2 seconds (ref 11.4–15.2)

## 2023-03-05 LAB — OSMOLALITY, URINE: Osmolality, Ur: 472 mOsm/kg (ref 300–900)

## 2023-03-05 LAB — CALCIUM
Calcium: 6.3 mg/dL — CL (ref 8.9–10.3)
Calcium: 6.7 mg/dL — ABNORMAL LOW (ref 8.9–10.3)

## 2023-03-05 LAB — ACETAMINOPHEN LEVEL: Acetaminophen (Tylenol), Serum: <10 ug/mL — ABNORMAL LOW (ref 10–30)

## 2023-03-05 LAB — LIPASE, BLOOD: Lipase: 36 U/L (ref 11–51)

## 2023-03-05 LAB — AMMONIA: Ammonia: 43 umol/L — ABNORMAL HIGH (ref 9–35)

## 2023-03-05 LAB — SALICYLATE LEVEL: Salicylate Lvl: <7 mg/dL — ABNORMAL LOW (ref 7.0–30.0)

## 2023-03-05 LAB — NA AND K (SODIUM & POTASSIUM), RAND UR
Potassium Urine: 24 mmol/L
Sodium, Ur: <10 mmol/L

## 2023-03-05 LAB — ETHANOL: Alcohol, Ethyl (B): 188 mg/dL — ABNORMAL HIGH (ref <10)

## 2023-03-05 LAB — HIV ANTIBODY (ROUTINE TESTING W REFLEX): HIV Screen 4th Generation wRfx: NONREACTIVE

## 2023-03-05 LAB — APTT: aPTT: 33 seconds (ref 24–36)

## 2023-03-05 MED ORDER — CALCIUM GLUCONATE-NACL 2-0.675 GM/100ML-% IV SOLN
2.0000 g | Freq: Once | INTRAVENOUS | Status: AC
  Administered 2023-03-05: 2000 mg via INTRAVENOUS
  Filled 2023-03-05: qty 100

## 2023-03-05 MED ORDER — CALCIUM GLUCONATE-NACL 2-0.675 GM/100ML-% IV SOLN
2.0000 g | Freq: Once | INTRAVENOUS | Status: AC
  Administered 2023-03-05: 2000 mg via INTRAVENOUS
  Filled 2023-03-05 (×2): qty 100

## 2023-03-05 MED ORDER — POTASSIUM CHLORIDE 20 MEQ PO PACK: 80.0000 meq | PACK | Freq: Three times a day (TID) | ORAL | Status: DC

## 2023-03-05 MED ORDER — ENOXAPARIN SODIUM 40 MG/0.4ML IJ SOSY
40.0000 mg | PREFILLED_SYRINGE | Freq: Every day | INTRAMUSCULAR | Status: DC
  Administered 2023-03-05 – 2023-03-11 (×7): 40 mg via SUBCUTANEOUS
  Filled 2023-03-05 (×7): qty 0.4

## 2023-03-05 MED ORDER — ACETAMINOPHEN 325 MG PO TABS: 650.0000 mg | ORAL_TABLET | Freq: Four times a day (QID) | ORAL | Status: DC | PRN

## 2023-03-05 MED ORDER — THIAMINE MONONITRATE 100 MG PO TABS: 100.0000 mg | ORAL_TABLET | Freq: Every day | ORAL | Status: DC

## 2023-03-05 MED ORDER — SPIRONOLACTONE 12.5 MG HALF TABLET
100.0000 mg | ORAL_TABLET | Freq: Every day | ORAL | Status: DC
  Administered 2023-03-05: 100 mg via ORAL
  Filled 2023-03-05: qty 8

## 2023-03-05 MED ORDER — SODIUM CHLORIDE 0.9 % IV SOLN: Freq: Once | INTRAVENOUS | Status: AC

## 2023-03-05 MED ORDER — FUROSEMIDE 10 MG/ML IJ SOLN
40.0000 mg | Freq: Two times a day (BID) | INTRAMUSCULAR | Status: DC
  Administered 2023-03-05: 40 mg via INTRAVENOUS
  Filled 2023-03-05: qty 4

## 2023-03-05 MED ORDER — LOPERAMIDE HCL 2 MG PO CAPS: 2.0000 mg | ORAL_CAPSULE | ORAL | Status: DC | PRN

## 2023-03-05 MED ORDER — GABAPENTIN 300 MG PO CAPS
300.0000 mg | ORAL_CAPSULE | Freq: Three times a day (TID) | ORAL | Status: DC
  Administered 2023-03-05 – 2023-03-12 (×22): 300 mg via ORAL
  Filled 2023-03-05 (×22): qty 1

## 2023-03-05 MED ORDER — CHLORDIAZEPOXIDE HCL 25 MG PO CAPS
25.0000 mg | ORAL_CAPSULE | ORAL | Status: AC
  Administered 2023-03-06 – 2023-03-07 (×2): 25 mg via ORAL
  Filled 2023-03-05 (×2): qty 1

## 2023-03-05 MED ORDER — CHLORDIAZEPOXIDE HCL 25 MG PO CAPS: 25.0000 mg | ORAL_CAPSULE | Freq: Every day | ORAL | Status: DC

## 2023-03-05 MED ORDER — LEVETIRACETAM IN NACL 500 MG/100ML IV SOLN
500.0000 mg | Freq: Two times a day (BID) | INTRAVENOUS | Status: DC
  Administered 2023-03-05 – 2023-03-11 (×13): 500 mg via INTRAVENOUS
  Filled 2023-03-05 (×13): qty 100

## 2023-03-05 MED ORDER — POTASSIUM CHLORIDE 20 MEQ PO PACK: 80.0000 meq | PACK | Freq: Two times a day (BID) | ORAL | Status: DC

## 2023-03-05 MED ORDER — MAGNESIUM SULFATE 2 GM/50ML IV SOLN
2.0000 g | Freq: Once | INTRAVENOUS | Status: AC
  Administered 2023-03-05: 2 g via INTRAVENOUS
  Filled 2023-03-05: qty 50

## 2023-03-05 MED ORDER — METOPROLOL SUCCINATE ER 25 MG PO TB24
25.0000 mg | ORAL_TABLET | Freq: Every day | ORAL | Status: DC
  Administered 2023-03-05 – 2023-03-12 (×8): 25 mg via ORAL
  Filled 2023-03-05 (×8): qty 1

## 2023-03-05 MED ORDER — THIAMINE HCL 100 MG/ML IJ SOLN: 100.0000 mg | Freq: Every day | INTRAMUSCULAR | Status: DC

## 2023-03-05 MED ORDER — POTASSIUM CHLORIDE 20 MEQ PO PACK
80.0000 meq | PACK | Freq: Two times a day (BID) | ORAL | Status: AC
  Administered 2023-03-05 (×2): 80 meq via ORAL
  Filled 2023-03-05 (×2): qty 4

## 2023-03-05 MED ORDER — LACTULOSE 10 GM/15ML PO SOLN: 30.0000 g | Freq: Two times a day (BID) | ORAL | Status: DC | PRN

## 2023-03-05 MED ORDER — ONDANSETRON 4 MG PO TBDP: 4.0000 mg | ORAL_TABLET | Freq: Four times a day (QID) | ORAL | Status: DC | PRN

## 2023-03-05 MED ORDER — HYDROXYZINE HCL 25 MG PO TABS: 25.0000 mg | ORAL_TABLET | Freq: Four times a day (QID) | ORAL | Status: DC | PRN

## 2023-03-05 MED ORDER — CHLORDIAZEPOXIDE HCL 25 MG PO CAPS
25.0000 mg | ORAL_CAPSULE | Freq: Four times a day (QID) | ORAL | Status: AC
  Administered 2023-03-05 (×4): 25 mg via ORAL
  Filled 2023-03-05 (×4): qty 1

## 2023-03-05 MED ORDER — POTASSIUM CHLORIDE 10 MEQ/100ML IV SOLN
10.0000 meq | INTRAVENOUS | Status: AC
  Administered 2023-03-05 (×3): 10 meq via INTRAVENOUS
  Filled 2023-03-05 (×3): qty 100

## 2023-03-05 MED ORDER — CALCIUM GLUCONATE-NACL 1-0.675 GM/50ML-% IV SOLN: 1.0000 g | Freq: Once | INTRAVENOUS | Status: DC

## 2023-03-05 MED ORDER — QUETIAPINE FUMARATE 100 MG PO TABS
200.0000 mg | ORAL_TABLET | Freq: Every day | ORAL | Status: DC
  Administered 2023-03-05 – 2023-03-11 (×7): 200 mg via ORAL
  Filled 2023-03-05 (×7): qty 2

## 2023-03-05 MED ORDER — POTASSIUM CHLORIDE 10 MEQ/100ML IV SOLN
10.0000 meq | INTRAVENOUS | Status: AC
  Administered 2023-03-05 (×2): 10 meq via INTRAVENOUS
  Filled 2023-03-05: qty 100

## 2023-03-05 MED ORDER — ONDANSETRON HCL 4 MG/2ML IJ SOLN
4.0000 mg | Freq: Four times a day (QID) | INTRAMUSCULAR | Status: DC | PRN
  Administered 2023-03-05: 4 mg via INTRAVENOUS
  Filled 2023-03-05: qty 2

## 2023-03-05 MED ORDER — LACTULOSE 10 GM/15ML PO SOLN
30.0000 g | Freq: Two times a day (BID) | ORAL | Status: DC
  Administered 2023-03-05 – 2023-03-08 (×7): 30 g via ORAL
  Filled 2023-03-05: qty 45
  Filled 2023-03-05 (×4): qty 60
  Filled 2023-03-05: qty 45
  Filled 2023-03-05 (×2): qty 60

## 2023-03-05 MED ORDER — LEVETIRACETAM 500 MG PO TABS: 500.0000 mg | ORAL_TABLET | Freq: Two times a day (BID) | ORAL | Status: DC

## 2023-03-05 MED ORDER — FOLIC ACID 5 MG/ML IJ SOLN: 1.0000 mg | Freq: Every day | INTRAMUSCULAR | Status: DC

## 2023-03-05 MED ORDER — CHLORDIAZEPOXIDE HCL 25 MG PO CAPS: 25.0000 mg | ORAL_CAPSULE | Freq: Four times a day (QID) | ORAL | Status: DC | PRN

## 2023-03-05 MED ORDER — SPIRONOLACTONE 12.5 MG HALF TABLET: 100.0000 mg | ORAL_TABLET | Freq: Every day | ORAL | Status: DC

## 2023-03-05 MED ORDER — POTASSIUM CHLORIDE 10 MEQ/100ML IV SOLN
10.0000 meq | INTRAVENOUS | Status: AC
  Administered 2023-03-05 (×6): 10 meq via INTRAVENOUS
  Filled 2023-03-05 (×6): qty 100

## 2023-03-05 MED ORDER — ALLOPURINOL 100 MG PO TABS
100.0000 mg | ORAL_TABLET | Freq: Every day | ORAL | Status: DC
  Administered 2023-03-05 – 2023-03-07 (×3): 100 mg via ORAL
  Filled 2023-03-05 (×3): qty 1

## 2023-03-05 MED ORDER — ADULT MULTIVITAMIN W/MINERALS CH
1.0000 | ORAL_TABLET | Freq: Every day | ORAL | Status: DC
  Administered 2023-03-05 – 2023-03-12 (×8): 1 via ORAL
  Filled 2023-03-05 (×8): qty 1

## 2023-03-05 MED ORDER — POTASSIUM CHLORIDE 10 MEQ/100ML IV SOLN
10.0000 meq | INTRAVENOUS | Status: AC
  Administered 2023-03-05 (×2): 10 meq via INTRAVENOUS
  Filled 2023-03-05 (×2): qty 100

## 2023-03-05 MED ORDER — CHLORDIAZEPOXIDE HCL 25 MG PO CAPS
25.0000 mg | ORAL_CAPSULE | Freq: Three times a day (TID) | ORAL | Status: AC
  Administered 2023-03-05 – 2023-03-06 (×3): 25 mg via ORAL
  Filled 2023-03-05 (×3): qty 1

## 2023-03-05 MED ORDER — PANTOPRAZOLE SODIUM 40 MG PO TBEC: 40.0000 mg | DELAYED_RELEASE_TABLET | Freq: Every day | ORAL | Status: DC

## 2023-03-05 NOTE — ED Notes (Signed)
Ca 6.4, Cl <65, K <2.0. MD notified

## 2023-03-05 NOTE — ED Notes (Signed)
BSC provided and ready for when pt needs to void,, falls band and socks provided call light in reach

## 2023-03-05 NOTE — ED Notes (Addendum)
Ca 5.1 Cl <65 Na 115 K <2.0 MD POLLINA NOTIFIED OF CRITICAL RESULTS

## 2023-03-05 NOTE — ED Notes (Signed)
Pt states that he went on a bender for a month. Has been drinking himself unconscious. Is unsure if and what he has been eating or drinking water. States that he has cramps all over and hiccups

## 2023-03-05 NOTE — Consult Note (Signed)
[facility] Face-to-Face Psychiatry Consult   Reason for Consult: Med adjustment Referring Physician:  Subrina Sundil Patient Identification: [name] MRN:  [id number] Principal Diagnosis: <principal problem not specified> Diagnosis:  Active Problems:   Hypokalemia   PTSD (post-traumatic stress disorder)   Alcohol abuse   Psychosis (HCC)   Essential hypertension   Hypomagnesemia   Alcoholic cirrhosis of liver with ascites (HCC)   Hypervolemic hyponatremia   Hypocalcemia   Hepatic cirrhosis (HCC)   History of seizure   Leukocytosis   Chronic anemia   Schizoaffective disorder (HCC)   Transaminitis   Elevated bilirubin   Hypochloremia    Subjective:   [name] is a 52 y.o. male patient admitted with Alcohol intoxication.[name]  HPI:    [name] is a 52 y.o. male with medical history significant of alcohol abuse, alcoholic liver cirrhosis, anxiety, PTSD, psychosis, schizoaffective disorder, alcohol withdrawal seizure disorder, GERD and chronic anemia presented to emergency department as he was not feeling very well.  Patient reported he is drinking heavily for last 1 month.  Mostly 8 to 10 cans of beer and 1-2 bottles of wine.  At ED initial presentation lab work showed significant electrolyte derangement. CMP showed sodium 117, potassium less than 2, chloride less than 65, blood glucose 115, bicarb 29, calcium 5.4, total protein 6.3, albumin 3, AST 162, ALT 91, ALP 133, bilirubin 2.1. CBC showed WBC 13.1, hemoglobin 13.8, RBC 3.99 and platelet 318. Lipase 36.  Elevated ammonia 43. UDS Positive for THC, Bal 188  Today patient seen for med management. Patient is on SSRI which could have resulted in hyponatremia. Patient reports he has been on Zoloft for few years now. He reports "tired" mood. He didn't want to continue with the discussion of other antidepressant options. He said " I can't think straight right now, can we d this later". He denies SI/HI.  Past Psychiatric  History: Reports h/o Schizoaffective d/o  Risk to Self:  No Risk to Others:  No Prior Inpatient Therapy:  Yes Prior Outpatient Therapy:  Yes  Past Medical History:  Past Medical History:  Diagnosis Date   Alcohol abuse    Anxiety    at age 35   Blood transfusion without reported diagnosis    Cirrhosis (HCC)    Depression    at age 81   GERD (gastroesophageal reflux disease)    Hypertension    Nausea and vomiting    Psychosis (HCC)    PTSD (post-traumatic stress disorder)    Schizoaffective disorder    Seizure disorder (HCC)    related to etoh seizure   Symptomatic anemia     Past Surgical History:  Procedure Laterality Date   ESOPHAGOGASTRODUODENOSCOPY (EGD) WITH PROPOFOL N/A 05/05/2020   Procedure: ESOPHAGOGASTRODUODENOSCOPY (EGD) WITH PROPOFOL;  Surgeon: Napoleon Form, MD;  Location: [facility];  Service: Endoscopy;  Laterality: N/A;   FOOT SURGERY     right   HAND SURGERY     Family History:  Family History  Problem Relation Age of Onset   Alcoholism Mother    CAD Father    Depression Brother    Colon polyps Neg Hx    Esophageal cancer Neg Hx    Pancreatic cancer Neg Hx    Stomach cancer Neg Hx     Social History:  Social History   Substance and Sexual Activity  Alcohol Use Yes   Comment: "until I pass out"     Social History   Substance and Sexual Activity  Drug Use  Yes   Frequency: 1.0 times per week   Types: Marijuana    Social History   Socioeconomic History   Marital status: Divorced    Spouse name: Not on file   Number of children: Not on file   Years of education: Not on file   Highest education level: Not on file  Occupational History   Not on file  Tobacco Use   Smoking status: Every Day    Current packs/day: 1.50    Average packs/day: 1.5 packs/day for 30.0 years (45.0 ttl pk-yrs)    Types: Cigarettes   Smokeless tobacco: Never  Vaping Use   Vaping status: Never Used  Substance and Sexual Activity   Alcohol use: Yes     Comment: "until I pass out"   Drug use: Yes    Frequency: 1.0 times per week    Types: Marijuana   Sexual activity: Yes    Birth control/protection: Condom  Other Topics Concern   Not on file  Social History Narrative   Not on file   Social Determinants of Health   Financial Resource Strain: Not on file  Food Insecurity: No Food Insecurity (10/18/2019)   Hunger Vital Sign    Worried About Running Out of Food in the Last Year: Never true    Ran Out of Food in the Last Year: Never true  Transportation Needs: Unmet Transportation Needs (01/13/2022)   PRAPARE - Administrator, Civil Service (Medical): Yes    Lack of Transportation (Non-Medical): Yes  Physical Activity: Not on file  Stress: Not on file  Social Connections: Not on file   Additional Social History:    Allergies:   Allergies  Allergen Reactions   Bupropion Other (See Comments)    Caused seizures Caused seizures Feel as though I will have a seizure when taking this med    Labs:  Results for orders placed or performed during the hospital encounter of 03/04/23 (from the past 48 hour(s))  Ethanol     Status: Abnormal   Collection Time: 03/04/23 11:20 PM  Result Value Ref Range   Alcohol, Ethyl (B) 188 (H) <10 mg/dL    Comment: (NOTE) Lowest detectable limit for serum alcohol is 10 mg/dL.  For medical purposes only. Performed at [facility], [address], [place], [place] [address]   Salicylate level     Status: Abnormal   Collection Time: 03/04/23 11:20 PM  Result Value Ref Range   Salicylate Lvl <7.0 (L) 7.0 - 30.0 mg/dL    Comment: Performed at [facility], [address], [place], [place] [address]  Acetaminophen level     Status: Abnormal   Collection Time: 03/04/23 11:20 PM  Result Value Ref Range   Acetaminophen (Tylenol), Serum <10 (L) 10 - 30 ug/mL    Comment: (NOTE) Therapeutic concentrations vary significantly. A range of 10-30 ug/mL  may be an effective  concentration for many patients. However, some  are best treated at concentrations outside of this range. Acetaminophen concentrations >150 ug/mL at 4 hours after ingestion  and >50 ug/mL at 12 hours after ingestion are often associated with  toxic reactions.  Performed at [facility], [address], [place], [place] [address]   Comprehensive metabolic panel     Status: Abnormal   Collection Time: 03/04/23 11:20 PM  Result Value Ref Range   Sodium 117 (LL) 135 - 145 mmol/L    Comment: CRITICAL RESULT CALLED TO, READ BACK BY  AND VERIFIED WITH F. FERNIDAS RN 03/05/23 @0120  BY J. WHITE   Potassium <2.0 (LL) 3.5 - 5.1 mmol/L    Comment: CRITICAL RESULT CALLED TO, READ BACK BY AND VERIFIED WITH F. FERNIDAS RN 03/05/23 @0120  BY J. WHITE   Chloride <65 (LL) 98 - 111 mmol/L    Comment: CRITICAL RESULT CALLED TO, READ BACK BY AND VERIFIED WITH F. FERNIDAS RN 03/05/23 @0120  BY J. WHITE   CO2 29 22 - 32 mmol/L   Glucose, Bld 155 (H) 70 - 99 mg/dL    Comment: Glucose reference range applies only to samples taken after fasting for at least 8 hours.   BUN 13 6 - 20 mg/dL   Creatinine, Ser 9.51 0.61 - 1.24 mg/dL   Calcium 5.4 (LL) 8.9 - 10.3 mg/dL    Comment: CRITICAL RESULT CALLED TO, READ BACK BY AND VERIFIED WITH F. FERNIDAS RN 03/05/23 @0120  BY J. WHITE   Total Protein 6.3 (L) 6.5 - 8.1 g/dL   Albumin 3.0 (L) 3.5 - 5.0 g/dL   AST 884 (H) 15 - 41 U/L   ALT 91 (H) 0 - 44 U/L   Alkaline Phosphatase 133 (H) 38 - 126 U/L   Total Bilirubin 2.1 (H) 0.3 - 1.2 mg/dL   GFR, Estimated >16 >60 mL/min    Comment: (NOTE) Calculated using the CKD-EPI Creatinine Equation (2021)    Anion gap NOT CALCULATED 5 - 15    Comment: ELECTROLYTES REPEATED TO VERIFY Performed at [facility], [address], [place], [place] [address]   Ammonia     Status: Abnormal   Collection Time: 03/05/23 12:34 AM  Result Value Ref Range   Ammonia 43 (H) 9 - 35 umol/L    Comment: Performed at [facility]  [facility], [address] [place], [place] [address]  Comprehensive metabolic panel     Status: Abnormal   Collection Time: 03/05/23 12:34 AM  Result Value Ref Range   Sodium 115 (LL) 135 - 145 mmol/L    Comment: CRITICAL RESULT CALLED TO, READ BACK BY AND VERIFIED WITH F. FERNIDAS RN 03/05/23 @0120  BY J. WHITE   Potassium <2.0 (LL) 3.5 - 5.1 mmol/L    Comment: CRITICAL RESULT CALLED TO, READ BACK BY AND VERIFIED WITH F. FERNIDAS RN 03/05/23 @0120  BY J. WHITE   Chloride <65 (LL) 98 - 111 mmol/L    Comment: CRITICAL RESULT CALLED TO, READ BACK BY AND VERIFIED WITH F. FERNIDAS RN 03/05/23 @0120  BY J. WHITE   CO2 31 22 - 32 mmol/L   Glucose, Bld 139 (H) 70 - 99 mg/dL    Comment: Glucose reference range applies only to samples taken after fasting for at least 8 hours.   BUN 13 6 - 20 mg/dL   Creatinine, Ser 2.35 0.61 - 1.24 mg/dL   Calcium 5.1 (LL) 8.9 - 10.3 mg/dL    Comment: CRITICAL RESULT CALLED TO, READ BACK BY AND VERIFIED WITH F. FERNIDAS RN 03/05/23 @0120  BY J. WHITE   Total Protein 6.1 (L) 6.5 - 8.1 g/dL   Albumin 2.9 (L) 3.5 - 5.0 g/dL   AST 573 (H) 15 - 41 U/L   ALT 86 (H) 0 - 44 U/L   Alkaline Phosphatase 117 38 - 126 U/L   Total Bilirubin 2.2 (H) 0.3 - 1.2 mg/dL   GFR, Estimated >22 >02 mL/min    Comment: (NOTE) Calculated using the CKD-EPI Creatinine Equation (2021)    Anion gap NOT CALCULATED 5 - 15  Comment: ELECTROLYTES REPEATED TO VERIFY Performed at [facility], [address], [place], [place] [address]   Lipase, blood     Status: None   Collection Time: 03/05/23 12:34 AM  Result Value Ref Range   Lipase 36 11 - 51 U/L    Comment: Performed at [facility], [address], [place], [place] [address]  CBC     Status: Abnormal   Collection Time: 03/05/23 12:34 AM  Result Value Ref Range   WBC 13.1 (H) 4.0 - 10.5 K/uL   RBC 3.99 (L) 4.22 - 5.81 MIL/uL   Hemoglobin 13.8 13.0 - 17.0 g/dL   HCT 09.8 (L) 11.9 - 14.7 %   MCV 93.0 80.0 - 100.0 fL   MCH 34.6 (H)  26.0 - 34.0 pg   MCHC 37.2 (H) 30.0 - 36.0 g/dL   RDW 82.9 (H) 56.2 - 13.0 %   Platelets 318 150 - 400 K/uL   nRBC 0.6 (H) 0.0 - 0.2 %    Comment: Performed at [facility], [address], [place], [place] [address]  Protime-INR     Status: None   Collection Time: 03/05/23  1:22 AM  Result Value Ref Range   Prothrombin Time 14.2 11.4 - 15.2 seconds   INR 1.1 0.8 - 1.2    Comment: (NOTE) INR goal varies based on device and disease states. Performed at [facility], [address] [place], [place] [address]   Magnesium     Status: Abnormal   Collection Time: 03/05/23  1:22 AM  Result Value Ref Range   Magnesium 0.8 (LL) 1.7 - 2.4 mg/dL    Comment: CRITICAL RESULT CALLED TO, READ BACK BY AND VERIFIED WITH F. FREITAS RN 03/05/23 @0202  BY J. WHITE Performed at [facility], [address] [place], [place] [address]   Na and K (sodium & potassium), rand urine     Status: None   Collection Time: 03/05/23  3:15 AM  Result Value Ref Range   Sodium, Ur <10 mmol/L   Potassium Urine 24 mmol/L    Comment: Performed at [facility], [address], [place], [place] [address]  Osmolality, urine     Status: None   Collection Time: 03/05/23  3:15 AM  Result Value Ref Range   Osmolality, Ur 472 300 - 900 mOsm/kg    Comment: Performed at [facility], [address] [place], [place] [address]  Magnesium     Status: None   Collection Time: 03/05/23  3:45 AM  Result Value Ref Range   Magnesium 1.8 1.7 - 2.4 mg/dL    Comment: Performed at [facility], [address], [place], [place] [address]  Basic metabolic panel     Status: Abnormal   Collection Time: 03/05/23  3:45 AM  Result Value Ref Range   Sodium 119 (LL) 135 - 145 mmol/L    Comment: CRITICAL RESULT CALLED TO, READ BACK BY AND VERIFIED WITH F. FRIETAS RN 03/05/23 @0505  BY J. WHITE   Potassium <2.0 (LL) 3.5 - 5.1 mmol/L    Comment: CRITICAL RESULT CALLED TO, READ BACK BY AND VERIFIED WITH F.  FRIETAS RN 03/05/23 @0505  BY J. WHITE   Chloride <65 (LL) 98 - 111 mmol/L    Comment: CRITICAL RESULT CALLED TO, READ BACK BY AND VERIFIED WITH F. FRIETAS RN 03/05/23 @0505  BY J. WHITE   CO2 32 22 - 32 mmol/L   Glucose,  Bld 151 (H) 70 - 99 mg/dL    Comment: Glucose reference range applies only to samples taken after fasting for at least 8 hours.   BUN 13 6 - 20 mg/dL   Creatinine, Ser 9.52 0.61 - 1.24 mg/dL   Calcium 5.2 (LL) 8.9 - 10.3 mg/dL    Comment: CRITICAL RESULT CALLED TO, READ BACK BY AND VERIFIED WITH F. FRIETAS RN 03/05/23 @0505  BY J. WHITE   GFR, Estimated >60 >60 mL/min    Comment: (NOTE) Calculated using the CKD-EPI Creatinine Equation (2021)    Anion gap NOT CALCULATED 5 - 15    Comment: ELECTROLYTES REPEATED TO VERIFY Performed at [facility], [address], [place], [place] [address]   HIV Antibody (routine testing w rflx)     Status: None   Collection Time: 03/05/23  3:45 AM  Result Value Ref Range   HIV Screen 4th Generation wRfx Non Reactive Non Reactive    Comment: Performed at [facility], [address], [place], [place] [address]  Phosphorus     Status: None   Collection Time: 03/05/23  3:45 AM  Result Value Ref Range   Phosphorus 3.6 2.5 - 4.6 mg/dL    Comment: Performed at [facility], [address], [place], [place] [address]  Hepatitis panel, acute     Status: None   Collection Time: 03/05/23  3:45 AM  Result Value Ref Range   Hepatitis B Surface Ag NON REACTIVE NON REACTIVE   HCV Ab NON REACTIVE NON REACTIVE    Comment: (NOTE) Nonreactive HCV antibody screen is consistent with no HCV infections,  unless recent infection is suspected or other evidence exists to indicate HCV infection.     Hep A IgM NON REACTIVE NON REACTIVE   Hep B C IgM NON REACTIVE NON REACTIVE    Comment: Performed at [facility], [address] [place], [place] [address]  APTT     Status: None   Collection Time: 03/05/23  3:45 AM  Result Value Ref  Range   aPTT 33 24 - 36 seconds    Comment: Performed at [facility], [address], [place], [place] [address]  VITAMIN D 25 Hydroxy (Vit-D Deficiency, Fractures)     Status: Abnormal   Collection Time: 03/05/23  3:45 AM  Result Value Ref Range   Vit D, 25-Hydroxy 5.63 (L) 30 - 100 ng/mL    Comment: (NOTE) Vitamin D deficiency has been defined by the Institute of Medicine  and an Endocrine Society practice guideline as a level of serum 25-OH  vitamin D less than 20 ng/mL (1,2). The Endocrine Society went on to  further define vitamin D insufficiency as a level between 21 and 29  ng/mL (2).  1. IOM (Institute of Medicine). 2010. Dietary reference intakes for  calcium and D. [place]: [employer]. 2. Holick MF, Binkley Lockney, Bischoff-Ferrari HA, et al. Evaluation,  treatment, and prevention of vitamin D deficiency: an Endocrine  Society clinical practice guideline, JCEM. 2011 Jul; 96(7): 1911-30.  Performed at [facility], [address], [place], [place] [address]   CBC     Status: Abnormal   Collection Time: 03/05/23  4:34 AM  Result Value Ref Range   WBC 13.2 (H) 4.0 - 10.5 K/uL   RBC 4.12 (L) 4.22 - 5.81 MIL/uL   Hemoglobin 14.0 13.0 - 17.0 g/dL   HCT 87.5 (L) 64.3 - 32.9 %  MCV 92.5 80.0 - 100.0 fL   MCH 34.0 26.0 - 34.0 pg   MCHC 36.7 (H) 30.0 - 36.0 g/dL   RDW 40.9 (H) 81.1 - 91.4 %   Platelets 315 150 - 400 K/uL   nRBC 0.6 (H) 0.0 - 0.2 %    Comment: Performed at [facility], [address], [place], [place] [address]  Basic metabolic panel     Status: Abnormal   Collection Time: 03/05/23  4:48 AM  Result Value Ref Range   Sodium 121 (L) 135 - 145 mmol/L   Potassium <2.0 (LL) 3.5 - 5.1 mmol/L    Comment: CRITICAL RESULT CALLED TO, READ BACK BY AND VERIFIED WITH F. FRIETAS RN 03/05/23 @0636  BY J. WHITE   Chloride <65 (LL) 98 - 111 mmol/L    Comment: CRITICAL RESULT CALLED TO, READ BACK BY AND VERIFIED WITH F. FRIETAS RN  03/05/23 @0636  BY J. WHITE   CO2 31 22 - 32 mmol/L   Glucose, Bld 137 (H) 70 - 99 mg/dL    Comment: Glucose reference range applies only to samples taken after fasting for at least 8 hours.   BUN 14 6 - 20 mg/dL   Creatinine, Ser 6.21 0.61 - 1.24 mg/dL   Calcium 6.4 (LL) 8.9 - 10.3 mg/dL    Comment: CRITICAL RESULT CALLED TO, READ BACK BY AND VERIFIED WITH F. FRIETAS RN 03/05/23 @0636  BY J. WHITE   GFR, Estimated >60 >60 mL/min    Comment: (NOTE) Calculated using the CKD-EPI Creatinine Equation (2021)    Anion gap NOT CALCULATED 5 - 15    Comment: ELECTROLYTES REPEATED TO VERIFY Performed at [facility], [address] [place], [place] [address]   Osmolality     Status: None   Collection Time: 03/05/23  4:48 AM  Result Value Ref Range   Osmolality 277 275 - 295 mOsm/kg    Comment: Performed at [facility], [address], [place], [place] [address]  Calcium     Status: Abnormal   Collection Time: 03/05/23  4:48 AM  Result Value Ref Range   Calcium 6.3 (LL) 8.9 - 10.3 mg/dL    Comment: CRITICAL RESULT CALLED TO, READ BACK BY AND VERIFIED WITH F. FRIETAS RN 03/05/23 @0622  BY J. WHITE Performed at [facility], [address], [place], [place] [address]   Hepatic function panel     Status: Abnormal   Collection Time: 03/05/23  4:48 AM  Result Value Ref Range   Total Protein 6.9 6.5 - 8.1 g/dL   Albumin 3.3 (L) 3.5 - 5.0 g/dL   AST 841 (H) 15 - 41 U/L   ALT 93 (H) 0 - 44 U/L   Alkaline Phosphatase 136 (H) 38 - 126 U/L   Total Bilirubin 2.3 (H) 0.3 - 1.2 mg/dL   Bilirubin, Direct 1.1 (H) 0.0 - 0.2 mg/dL   Indirect Bilirubin 1.2 (H) 0.3 - 0.9 mg/dL    Comment: Performed at [facility], [address], [place], [place] [address]  Calcium     Status: Abnormal   Collection Time: 03/05/23 10:05 AM  Result Value Ref Range   Calcium 6.7 (L) 8.9 - 10.3 mg/dL    Comment: Performed at [facility], [address], [place], [place] [address]    Current  Facility-Administered Medications  Medication Dose Route Frequency Provider Last Rate Last Admin   acetaminophen (TYLENOL) tablet 650 mg  650 mg Oral Q6H  PRN Janalyn Shy, Subrina, MD       albumin human 25 % solution 12.5 g  12.5 g Intravenous Once Unk Lightning, [place]       allopurinol (ZYLOPRIM) tablet 100 mg  100 mg Oral Daily Sundil, Subrina, MD   100 mg at 03/05/23 0944   calcium gluconate 2 g/ 100 mL sodium chloride IVPB  2 g Intravenous Once Sundil, Subrina, MD 100 mL/hr at 03/05/23 1153 2,000 mg at 03/05/23 1153   chlordiazePOXIDE (LIBRIUM) capsule 25 mg  25 mg Oral Q6H PRN Janalyn Shy, Subrina, MD       chlordiazePOXIDE (LIBRIUM) capsule 25 mg  25 mg Oral QID Sundil, Subrina, MD   25 mg at 03/05/23 1610   Followed by   chlordiazePOXIDE (LIBRIUM) capsule 25 mg  25 mg Oral TID Tereasa Coop, MD       Followed by   Melene Muller ON 03/06/2023] chlordiazePOXIDE (LIBRIUM) capsule 25 mg  25 mg Oral BH-qamhs Sundil, Subrina, MD       Followed by   Melene Muller ON 03/07/2023] chlordiazePOXIDE (LIBRIUM) capsule 25 mg  25 mg Oral Daily Sundil, Subrina, MD       enoxaparin (LOVENOX) injection 40 mg  40 mg Subcutaneous Daily Sundil, Subrina, MD   40 mg at 03/05/23 9604   folic acid (FOLVITE) tablet 1 mg  1 mg Oral Daily Sundil, Subrina, MD   1 mg at 03/05/23 0943   gabapentin (NEURONTIN) capsule 300 mg  300 mg Oral TID Tyrone Nine, MD   300 mg at 03/05/23 5409   hydrOXYzine (ATARAX) tablet 25 mg  25 mg Oral Q6H PRN Janalyn Shy, Subrina, MD       lactulose (CHRONULAC) 10 GM/15ML solution 30 g  30 g Oral BID Hazeline Junker B, MD   30 g at 03/05/23 0945   levETIRAcetam (KEPPRA) IVPB 500 mg/100 mL premix  500 mg Intravenous BID Janalyn Shy Subrina, MD   Stopped at 03/05/23 0432   loperamide (IMODIUM) capsule 2-4 mg  2-4 mg Oral PRN Janalyn Shy, Subrina, MD       LORazepam (ATIVAN) tablet 1-4 mg  1-4 mg Oral Q1H PRN Janalyn Shy, Subrina, MD   1 mg at 03/05/23 8119   Or   LORazepam (ATIVAN) injection 1-4 mg  1-4 mg Intravenous Q1H PRN  Janalyn Shy, Subrina, MD       metoprolol succinate (TOPROL-XL) 24 hr tablet 25 mg  25 mg Oral Daily Tyrone Nine, MD   25 mg at 03/05/23 0944   multivitamin with minerals tablet 1 tablet  1 tablet Oral Daily Sundil, Subrina, MD   1 tablet at 03/05/23 0944   ondansetron (ZOFRAN) injection 4 mg  4 mg Intravenous Q6H PRN Sundil, Subrina, MD   4 mg at 03/05/23 0631   potassium chloride (KLOR-CON) packet 80 mEq  80 mEq Oral BID Janalyn Shy, Subrina, MD   80 mEq at 03/05/23 1478   QUEtiapine (SEROQUEL) tablet 200 mg  200 mg Oral QHS Tyrone Nine, MD       spironolactone (ALDACTONE) tablet 100 mg  100 mg Oral Daily Sundil, Subrina, MD   100 mg at 03/05/23 2956   thiamine (VITAMIN B1) tablet 100 mg  100 mg Oral Daily Sundil, Subrina, MD   100 mg at 03/05/23 2130   Or   thiamine (VITAMIN B1) injection 100 mg  100 mg Intravenous Daily Sundil, Subrina, MD       Current Outpatient Medications  Medication Sig Dispense Refill   allopurinol (ZYLOPRIM) 100 MG tablet  Take 1 tablet (100 mg total) by mouth daily. 90 tablet 1   gabapentin (NEURONTIN) 300 MG capsule Take 1 capsule (300 mg total) by mouth 3 (three) times daily. 90 capsule 1   ibuprofen (ADVIL) 200 MG tablet Take 600 mg by mouth every 6 (six) hours as needed for headache or moderate pain.     lactulose (CHRONULAC) 10 GM/15ML solution TAKE 45 MLS (30 G TOTAL) BY MOUTH 2 (TWO) TIMES DAILY. 473 mL 1   levETIRAcetam (KEPPRA) 500 MG tablet Take 1 tablet (500 mg total) by mouth 2 (two) times daily. 180 tablet 1   metoprolol succinate (TOPROL-XL) 25 MG 24 hr tablet Take 1 tablet (25 mg total) by mouth daily. 90 tablet 1   pantoprazole (PROTONIX) 40 MG tablet Take 1 tablet (40 mg total) by mouth daily. 90 tablet 1   QUEtiapine (SEROQUEL) 200 MG tablet Take 1 tablet (200 mg total) by mouth at bedtime. 90 tablet 1   sertraline (ZOLOFT) 25 MG tablet Take 1 tablet (25 mg total) by mouth daily. 90 tablet 1   spironolactone (ALDACTONE) 100 MG tablet Take 1 tablet (100 mg  total) by mouth daily. 90 tablet 1     Psychiatric Specialty Exam:  Presentation  General Appearance:  Appropriate for Environment  Eye Contact: Fair  Speech: Clear and Coherent; Slow  Speech Volume: Decreased  Handedness:No data recorded  Mood and Affect  Mood: Anxious  Affect: Congruent   Thought Process  Thought Processes: Coherent; Goal Directed  Descriptions of Associations:Intact  Orientation:Full (Time, Place and Person)  Thought Content:Abstract Reasoning; Logical  History of Schizophrenia/Schizoaffective disorder:No data recorded Duration of Psychotic Symptoms:No data recorded Hallucinations:Hallucinations: None  Ideas of Reference:None  Suicidal Thoughts:Suicidal Thoughts: No  Homicidal Thoughts:Homicidal Thoughts: No   Sensorium  Memory: Recent Fair  Judgment: Fair  Insight: Fair   Art therapist  Concentration: Fair  Attention Span: Fair  Recall:No data recorded Fund of Knowledge:No data recorded Language: Fair   Psychomotor Activity  Psychomotor Activity: Psychomotor Activity: Decreased   Assets  Assets: Manufacturing systems engineer; Housing; Desire for Improvement   Sleep  Sleep: Sleep: Poor   Physical Exam: Physical Exam Constitutional:      Appearance: Normal appearance.  HENT:     Head: Normocephalic and atraumatic.     Nose: No congestion.     Mouth/Throat:     Mouth: Mucous membranes are moist.  Eyes:     Pupils: Pupils are equal, round, and reactive to light.  Pulmonary:     Effort: Pulmonary effort is normal.  Skin:    General: Skin is warm.  Neurological:     General: No focal deficit present.     Mental Status: He is alert and oriented to person, place, and time.    Review of Systems  Constitutional:  Positive for malaise/fatigue. Negative for chills.  HENT:  Negative for hearing loss.   Eyes:  Negative for blurred vision.  Neurological:  Negative for dizziness and focal weakness.   Psychiatric/Behavioral:  Negative for suicidal ideas.    Blood pressure 121/77, pulse 96, temperature 98.8 F (37.1 C), temperature source Oral, resp. rate (!) 24, height 6' (1.829 m), weight 99.8 kg, SpO2 95%. Body mass index is 29.84 kg/m.  Treatment Plan Summary: Daily contact with patient to assess and evaluate symptoms and progress in treatment and Medication management Patient didn't want to continue with the discussion of other antidepressant options. He said " I can't think straight right now, can we do this later".  Will assess tomorrow  Disposition: No evidence of imminent risk to self or others at present.    Lewanda Rife, MD

## 2023-03-05 NOTE — H&P (Addendum)
History and Physical    [name] WJX:[id number] DOB: March 02, 1971 DOA: 03/04/2023  PCP: Georganna Skeans, MD   Patient coming from: Home   Chief Complaint:  Chief Complaint  Patient presents with   Alcohol Intoxication    HPI: [name] is a 52 y.o. male with medical history significant of alcohol abuse, alcoholic liver cirrhosis, anxiety, PTSD, psychosis, schizoaffective disorder, alcohol withdrawal seizure disorder, GERD and chronic anemia presented to emergency department as he was not feeling very well.  Patient reported he is drinking heavily for last 1 month.  Mostly 8 to 10 cans of beer and 1-2 bottles of wine.  Reported not taking Keppra, Lasix and spironolactone for last 2 to 3 weeks as he ran out of this medication. Patient reported not taking 3 times meals in a day.  Complaining about generalized swelling of the abdomen.  Denies any chest pain, shortness of breath, palpitation, diarrhea, constipation, headache, blurry vision, and drinking of excess water. Patient reported mechanical fall at home and pain of the right-sided knee.  He did not loss consciousness.  ED Course:  At ED initial presentation lab work showed significant electrolyte derangement. CMP showed sodium 117, potassium less than 2, chloride less than 65, blood glucose 115, bicarb 29, calcium 5.4, total protein 6.3, albumin 3, AST 162, ALT 91, ALP 133, bilirubin 2.1. CBC showed WBC 13.1, hemoglobin 13.8, RBC 3.99 and platelet 318. Lipase 36.  Elevated ammonia 43. Pending UDS. Blood ethanol level of 188. CT head no acute intracranial process. X-ray of the right knee no evidence of fracture, dislocation or joint effusion.  In the emergency department patient received Keppra 1500 mg IV once.  In the setting of severe hyponatremia PCCM has been contacted by ED physician however given patient is asymptomatic hyponatremic, alert oriented x 3 and able to maintain airway without any evidence of seizure no  requirement for ICU admission at this time.  Recommended consult PCCM for future need.  Hospitalist team has been consulted to admit the patient for management of several electrolyte derangement and alcohol withdrawal.  Review of Systems:  Review of Systems  Constitutional:  Negative for chills, fever and malaise/fatigue.  HENT:  Negative for tinnitus.   Eyes:  Negative for blurred vision.  Respiratory:  Negative for cough.   Cardiovascular:  Negative for chest pain, palpitations, orthopnea and leg swelling.  Gastrointestinal:  Negative for abdominal pain, heartburn, nausea and vomiting.       Abdominal discomfort  Genitourinary:  Negative for dysuria, frequency and urgency.  Neurological:  Negative for dizziness, focal weakness, weakness and headaches.  Endo/Heme/Allergies:  Negative for polydipsia.  Psychiatric/Behavioral:  Negative for depression and substance abuse. The patient is not nervous/anxious.     Past Medical History:  Diagnosis Date   Alcohol abuse    Anxiety    at age 36   Blood transfusion without reported diagnosis    Cirrhosis (HCC)    Depression    at age 23   GERD (gastroesophageal reflux disease)    Hypertension    Nausea and vomiting    Psychosis (HCC)    PTSD (post-traumatic stress disorder)    Schizoaffective disorder    Seizure disorder (HCC)    related to etoh seizure   Symptomatic anemia     Past Surgical History:  Procedure Laterality Date   ESOPHAGOGASTRODUODENOSCOPY (EGD) WITH PROPOFOL N/A 05/05/2020   Procedure: ESOPHAGOGASTRODUODENOSCOPY (EGD) WITH PROPOFOL;  Surgeon: Napoleon Form, MD;  Location: [facility];  Service: Endoscopy;  Laterality:  N/A;   FOOT SURGERY     right   HAND SURGERY       reports that he has been smoking cigarettes. He has a 45 pack-year smoking history. He has never used smokeless tobacco. He reports current alcohol use. He reports current drug use. Frequency: 1.00 time per week. Drug: Marijuana.  Allergies   Allergen Reactions   Bupropion Other (See Comments)    Caused seizures Caused seizures Feel as though I will have a seizure when taking this med    Family History  Problem Relation Age of Onset   Alcoholism Mother    CAD Father    Depression Brother    Colon polyps Neg Hx    Esophageal cancer Neg Hx    Pancreatic cancer Neg Hx    Stomach cancer Neg Hx     Prior to Admission medications   Medication Sig Start Date End Date Taking? Authorizing Provider  allopurinol (ZYLOPRIM) 100 MG tablet Take 1 tablet (100 mg total) by mouth daily. 07/30/22   Georganna Skeans, MD  gabapentin (NEURONTIN) 300 MG capsule Take 1 capsule (300 mg total) by mouth 3 (three) times daily. 07/30/22   Georganna Skeans, MD  hydrOXYzine (ATARAX) 25 MG tablet Take 1 tablet (25 mg total) by mouth every 6 (six) hours as needed for anxiety (or CIWA score </= 10). Patient not taking: Reported on 02/02/2022 01/15/22   Mayers, Cari S, PA-C  ibuprofen (ADVIL) 200 MG tablet Take 600 mg by mouth every 6 (six) hours as needed for headache or moderate pain.    [provider]  ibuprofen (ADVIL) 600 MG tablet Take 1 tablet (600 mg total) by mouth every 8 (eight) hours as needed. 03/06/22   Georganna Skeans, MD  lactulose (CHRONULAC) 10 GM/15ML solution TAKE 45 MLS (30 G TOTAL) BY MOUTH 2 (TWO) TIMES DAILY. 12/03/22 03/13/23  Georganna Skeans, MD  levETIRAcetam (KEPPRA) 500 MG tablet Take 1 tablet (500 mg total) by mouth 2 (two) times daily. 07/30/22   Georganna Skeans, MD  metoprolol succinate (TOPROL-XL) 25 MG 24 hr tablet Take 1 tablet (25 mg total) by mouth daily. 07/30/22   Georganna Skeans, MD  metoprolol tartrate (LOPRESSOR) 25 MG tablet Take 25 mg by mouth daily. 03/24/22   [provider]  MILK THISTLE PO Take 1 capsule by mouth 2 (two) times daily.    [provider]  Multiple Vitamin (MULTIVITAMIN WITH MINERALS) TABS tablet Take 1 tablet by mouth daily. Patient not taking: Reported on 09/15/2022 01/13/22    Rodolph Bong, MD  nicotine (NICODERM CQ - DOSED IN MG/24 HOURS) 21 mg/24hr patch Place 1 patch (21 mg total) onto the skin daily. Patient not taking: Reported on 02/02/2022 01/14/22   Rodolph Bong, MD  ondansetron (ZOFRAN-ODT) 4 MG disintegrating tablet Take 1 tablet (4 mg total) by mouth every 8 (eight) hours as needed for nausea or vomiting. 01/13/22   Rodolph Bong, MD  pantoprazole (PROTONIX) 40 MG tablet Take 1 tablet (40 mg total) by mouth daily. 07/30/22   Georganna Skeans, MD  QUEtiapine (SEROQUEL) 200 MG tablet Take 1 tablet (200 mg total) by mouth at bedtime. 07/30/22   Georganna Skeans, MD  sertraline (ZOLOFT) 25 MG tablet Take 1 tablet (25 mg total) by mouth daily. 07/30/22   Georganna Skeans, MD  spironolactone (ALDACTONE) 100 MG tablet Take 1 tablet (100 mg total) by mouth daily. 07/30/22   Georganna Skeans, MD  thiamine 100 MG tablet Take 1 tablet (100 mg total)  by mouth daily. 01/13/22   Rodolph Bong, MD  vitamin C (VITAMIN C) 250 MG tablet Take 1 tablet (250 mg total) by mouth 2 (two) times daily. 08/06/20   Lewie Chamber, MD     Physical Exam: Vitals:   03/04/23 2332 03/05/23 0100 03/05/23 0223 03/05/23 0313  BP:  113/82 118/77   Pulse:  94 95   Resp:  (!) 24 (!) 23   Temp: 97.8 F (36.6 C)  98.1 F (36.7 C)   TempSrc:   Oral   SpO2:  90% 94%   Weight:    99.8 kg  Height:    6' (1.829 m)    Physical Exam Constitutional:      Appearance: He is obese. He is ill-appearing.  HENT:     Mouth/Throat:     Mouth: Mucous membranes are moist.  Eyes:     Pupils: Pupils are equal, round, and reactive to light.  Cardiovascular:     Rate and Rhythm: Normal rate and regular rhythm.     Pulses: Normal pulses.     Heart sounds: Normal heart sounds.  Pulmonary:     Effort: Pulmonary effort is normal.     Breath sounds: Normal breath sounds.  Abdominal:     General: Bowel sounds are normal. There is distension.     Tenderness: There is no abdominal tenderness.  There is no guarding or rebound.  Musculoskeletal:        General: No swelling.     Cervical back: Neck supple.     Right lower leg: No edema.     Left lower leg: No edema.  Skin:    General: Skin is warm.     Capillary Refill: Capillary refill takes less than 2 seconds.  Neurological:     Mental Status: He is alert and oriented to person, place, and time.  Psychiatric:        Mood and Affect: Mood normal.        Thought Content: Thought content normal.      Labs on Admission: I have personally reviewed following labs and imaging studies  CBC: Recent Labs  Lab 03/05/23 0034  WBC 13.1*  HGB 13.8  HCT 37.1*  MCV 93.0  PLT 318   Basic Metabolic Panel: Recent Labs  Lab 03/04/23 2320 03/05/23 0034 03/05/23 0122  NA 117* 115*  --   K <2.0* <2.0*  --   CL <65* <65*  --   CO2 29 31  --   GLUCOSE 155* 139*  --   BUN 13 13  --   CREATININE 1.00 1.03  --   CALCIUM 5.4* 5.1*  --   MG  --   --  0.8*   GFR: Estimated Creatinine Clearance: 105 mL/min (by C-G formula based on SCr of 1.03 mg/dL). Liver Function Tests: Recent Labs  Lab 03/04/23 2320 03/05/23 0034  AST 162* 150*  ALT 91* 86*  ALKPHOS 133* 117  BILITOT 2.1* 2.2*  PROT 6.3* 6.1*  ALBUMIN 3.0* 2.9*   Recent Labs  Lab 03/05/23 0034  LIPASE 36   Recent Labs  Lab 03/05/23 0034  AMMONIA 43*   Coagulation Profile: Recent Labs  Lab 03/05/23 0122  INR 1.1   Cardiac Enzymes: No results for input(s): "CKTOTAL", "CKMB", "CKMBINDEX", "TROPONINI", "TROPONINIHS" in the last 168 hours. BNP (last 3 results) No results for input(s): "BNP" in the last 8760 hours. HbA1C: No results for input(s): "HGBA1C" in the last 72 hours. CBG: No results for  input(s): "GLUCAP" in the last 168 hours. Lipid Profile: No results for input(s): "CHOL", "HDL", "LDLCALC", "TRIG", "CHOLHDL", "LDLDIRECT" in the last 72 hours. Thyroid Function Tests: No results for input(s): "TSH", "T4TOTAL", "FREET4", "T3FREE", "THYROIDAB" in  the last 72 hours. Anemia Panel: No results for input(s): "VITAMINB12", "FOLATE", "FERRITIN", "TIBC", "IRON", "RETICCTPCT" in the last 72 hours. Urine analysis:    Component Value Date/Time   COLORURINE AMBER (A) 01/10/2022 0124   APPEARANCEUR CLEAR 01/10/2022 0124   LABSPEC 1.017 01/10/2022 0124   PHURINE 6.0 01/10/2022 0124   GLUCOSEU NEGATIVE 01/10/2022 0124   HGBUR NEGATIVE 01/10/2022 0124   BILIRUBINUR NEGATIVE 01/10/2022 0124   KETONESUR 5 (A) 01/10/2022 0124   PROTEINUR 100 (A) 01/10/2022 0124   UROBILINOGEN 1.0 12/24/2014 0643   NITRITE NEGATIVE 01/10/2022 0124   LEUKOCYTESUR NEGATIVE 01/10/2022 0124    Radiological Exams on Admission: I have personally reviewed images CT HEAD WO CONTRAST ( )  Result Date: 03/05/2023 CLINICAL DATA:  Head trauma, abnormal mental status. EXAM: CT HEAD WITHOUT CONTRAST TECHNIQUE: Contiguous axial images were obtained from the base of the skull through the vertex without intravenous contrast. RADIATION DOSE REDUCTION: This exam was performed according to the departmental dose-optimization program which includes automated exposure control, adjustment of the mA and/or kV according to patient size and/or use of iterative reconstruction technique. COMPARISON:  09/07/2021. FINDINGS: Brain: No acute intracranial hemorrhage, midline shift or mass effect. No extra-axial fluid collection. Gray-white matter differentiation is within normal limits. No hydrocephalus. Vascular: No hyperdense vessel or unexpected calcification. Skull: Normal. Negative for fracture or focal lesion. Sinuses/Orbits: A small mucosal retention cyst or polyp is noted in the right maxillary sinus. Debris is noted in the posterior nasopharynx. No acute orbital abnormality. Other: None. IMPRESSION: No acute intracranial process. Electronically Signed   By: Thornell Sartorius M.D.   On: 03/05/2023 01:41   DG Knee Complete 4 Views Right  Result Date: 03/05/2023 CLINICAL DATA:  Fall with right  knee injury. EXAM: RIGHT KNEE - COMPLETE 4+ VIEW COMPARISON:  Radiographs 03/21/2011 FINDINGS: No evidence of fracture, dislocation, or joint effusion. No evidence of arthropathy or other focal bone abnormality. Soft tissues are unremarkable. IMPRESSION: Negative. Electronically Signed   By: Minerva Fester M.D.   On: 03/05/2023 00:24    EKG: Pending EKG.  Assessment/Plan: Active Problems:   Hypokalemia   PTSD (post-traumatic stress disorder)   Alcohol abuse   Hypomagnesemia   Hypervolemic hyponatremia   Hypocalcemia   Hepatic cirrhosis (HCC)   Leukocytosis   Essential hypertension   Alcoholic cirrhosis of liver with ascites (HCC)   Psychosis (HCC)   History of seizure   Chronic anemia   Schizoaffective disorder (HCC)   Transaminitis   Elevated bilirubin    Assessment and Plan: Hypervolemic hyponatremia Patient is volume overloaded on physical exam.  Abdominal ascites in the setting of alcoholic cirrhosis.  Serum sodium 117 on initial presentation.  Patient's drinking 8 to 10 cans of beer daily basis for last 1 month.  Also on Zoloft and Seroquel at home.  Etiology of hyponatremia combination of volume overload from ascites, beer proteinemia and possible SIADH side effect of Zoloft and Seroquel. -At this time given patient is volume overloaded in the setting of ascites I will start Lasix 40 mg IV twice daily and spironolactone 100 mg daily.  (At home patient is on Lasix 40 mg daily spironolactone 100 mg daily however noncompliance follows 2 to 3 weeks) - Checking serum osmolarity, urine osmolarity, urine sodium, urine creatinine, urine  potassium. -Checking serum sodium level every 4 hours. - Currently patient does not have any evidence of seizure.  Assuming patient has developed chronic hyponatremia and compensated. -If patient develop any seizure in the setting of hyponatremia will give bolus of 3% NaCl 100 mL . -Continue fluid restriction 2 L/day. -Checking ultrasonogram of the  abdomen to to assess the ascites volume and need for paracentesis. -PCCM has been consulted by ED and patient is not appropriate for ICU admission at this time. -Consulted and paged nephrology Dr. Valentino Nose. Waiting for response. -Continue telemonitoring for development of any arrhythmia Addendum - Per nephrology Dr. Valentino Nose recommended to hold the Lasix as of now until potassium normalized.  Also recommended continue KCl for management of hypochloremia as well which will improve the sodium level.  Given patient is alert oriented x 3, no evidence of lethargy and seizure per Dr. Valentino Nose recommendation wait for potassium to improve. Correcting potassium alone ameliorates the dangers associated with hyponatremia. Correcting hyponatremia before hypokalemia increases risk of mortality and osmotic demyelenation. If he does become symptomatic from hyponatremia then use 3% Nacl bolus.   Hypokalemia -Serum potassium less than 2 on initial presentation - Giving patient KCl 80 meq twice daily for 1 day and continue KCl 10 mEq 10 x 6 which was started in the ED. -Nephrology recommended aggressive potassium and chloride replacement before starting treating for hyponatremia. -Check potassium level every 4 hour. - Continue telemonitoring  Hypochloremia - Continue KCl oral oral and IV for management of hypochloremia as mentioned above.   Hypomagnesemia -Low magnesium level 0.8. -Replating with mag sulfate total 4 g. -Checking mag level every 4 hours and replete as needed   Hypocalcemia -Low calcium 5.1. -Checking ionized calcium - Giving calcium gluconate total 4 g. -Checking calcium level every 4 hour and replete as needed -Checking Phos and vitamin D level vitamin D Addendum - Low calcium 6.3 even with 4 g of calcium repletion.  Giving another 4 g of calcium gluconate.  Alcoholic cirrhosis-concern for ascites Transaminitis Hyperbilirubinemia Hypoalbuminemia -Patient has history of alcoholic  cirrhosis with ascites. Not compliant with Lasix and spironolactone at home. -Elevated AST/ALT 150/86, given AST double ALT represents chronic alcoholic hepatitis.  Elevated ALP level 150.  Normal lipase level. - Elevated bilirubin level 2.2 - Low albumin 2.9 -Checking abdominal ultrasound for ascites for need for paracentesis.   Alcohol abuse Alcohol withdrawal History of chronic alcohol use History of seizure due to alcohol withdrawal -Blood alcohol level 188 on presentation - Patient has previous history of alcohol withdrawal with complicated with seizure.  At home on Keppra 500 mg twice daily but not compliant. - Starting CIWA protocol with Librium taper as scheduled and Ativan as needed. - Continue thiamine, folic acid and multivitamin - Continue Keppra 500 mg twice daily IV as of now. -Continue fall precaution, seizure precaution aspiration precaution - Continue telemonitoring -Counseled patient at bedside for alcohol cessation.  Patient understands and willing to cooperate.  Consulted transition care team for further counseling in the a.m.   Hyperammonemia -Ammonia level 43 - Continue lactulose twice daily as needed with goal of bowel movement 2-3 times daily   Leukocytosis - Slight elevated WBC count 13.2.  Patient is afebrile and no evidence of infection. - Reactive leukocytosis in the setting of alcohol withdrawal.  Continue to monitor - Checking morning CBC   PTSD Schizoaffective disorder Generalized anxiety disorder -Hold doing home Zoloft and Seroquel in the setting of hyponatremia - Consulted with  Essential hypertension -At home  Toprol XL 25 mg daily.  Holding in the setting of borderline hypotension.  DVT prophylaxis:    Lovenox Code Status:    Full Code Diet:    Heart healthy diet, fluid restriction 2 L/day Family Communication:    Discussed treatment plan with patient.  No other family member at bedside. Disposition Plan: Plan to discharge to home in 2  to 3 days. Consults:   PCCM, nephrology, case management and psychiatry. Admission status:   Inpatient, Step Down Unit  Severity of Illness: The appropriate patient status for this patient is INPATIENT. Inpatient status is judged to be reasonable and necessary in order to provide the required intensity of service to ensure the patient's safety. The patient's presenting symptoms, physical exam findings, and initial radiographic and laboratory data in the context of their chronic comorbidities is felt to place them at high risk for further clinical deterioration. Furthermore, it is not anticipated that the patient will be medically stable for discharge from the hospital within 2 midnights of admission.   * I certify that at the point of admission it is my clinical judgment that the patient will require inpatient hospital care spanning beyond 2 midnights from the point of admission due to high intensity of service, high risk for further deterioration and high frequency of surveillance required.[name]    Tereasa Coop MD [facility]  How to contact the [facility] Attending or Consulting provider 7A - 7P or covering provider during after hours 7P -7A, for this patient?   Check the care team in [facility] and look for a) attending/consulting [facility] provider listed and b) the [facility] team listed Log into [email or web address] and use [place]'s universal password to access. If you do not have the password, please contact the hospital operator. Locate the [facility] provider you are looking for under [facility] and page to a number that you can be directly reached. If you still have difficulty reaching the provider, please page the [facility] (Director on Call) for the Hospitalists listed on amion for assistance.  03/05/2023, 3:46 AM

## 2023-03-05 NOTE — ED Notes (Addendum)
ED TO INPATIENT HANDOFF REPORT  ED Nurse Name and Phone #: [phone]  S Name/Age/Gender [name] 52 y.o. male Room/Bed: 005C/005C  Code Status   Code Status: Full Code  Home/SNF/Other Home Patient oriented to: self, place, time, and situation Is this baseline? Yes   Triage Complete: Triage complete  Chief Complaint Hyponatremia [E87.1]  Triage Note Pt from home via [facility]. States he has hx of cirrhosis, seizures, PTSD, and schizophrenia.  Reports "I was doing good but then went on a month long binge"  Pt states he drinks "four lokos" until he passes out and then when he wakes up he starts drinking again to repeat the process.  Has had some vomiting.  "Feels bad"  Pt states he has not taken his keppra or other meds for over a month.     Allergies Allergies  Allergen Reactions   Bupropion Other (See Comments)    Caused seizures Caused seizures Feel as though I will have a seizure when taking this med    Level of Care/Admitting Diagnosis ED Disposition     ED Disposition  Admit   Condition  --   Comment  Hospital Bautista: [facility] [[id number]]  Level of Care: Progressive [102]  Admit to Progressive based on following criteria: GI, ENDOCRINE disease patients with GI bleeding, acute liver failure or pancreatitis, stable with diabetic ketoacidosis or thyrotoxicosis (hypothyroid) state.  Admit to Progressive based on following criteria: MULTISYSTEM THREATS such as stable sepsis, metabolic/electrolyte imbalance with or without encephalopathy that is responding to early treatment.  May admit patient to [facility] or [name] if equivalent level of care is available:: No  Covid Evaluation: Asymptomatic - no recent exposure (last 10 days) testing not required  Diagnosis: Hyponatremia [198519]  Admitting Physician: Tereasa Coop [[id number]]  Attending Physician: Tereasa Coop [[id number]]  Certification:: I certify this patient will need inpatient services for  at least 2 midnights  Estimated Length of Stay: 5          B Medical/Surgery History Past Medical History:  Diagnosis Date   Alcohol abuse    Anxiety    at age 58   Blood transfusion without reported diagnosis    Cirrhosis (HCC)    Depression    at age 38   GERD (gastroesophageal reflux disease)    Hypertension    Nausea and vomiting    Psychosis (HCC)    PTSD (post-traumatic stress disorder)    Schizoaffective disorder    Seizure disorder (HCC)    related to etoh seizure   Symptomatic anemia    Past Surgical History:  Procedure Laterality Date   ESOPHAGOGASTRODUODENOSCOPY (EGD) WITH PROPOFOL N/A 05/05/2020   Procedure: ESOPHAGOGASTRODUODENOSCOPY (EGD) WITH PROPOFOL;  Surgeon: Napoleon Form, MD;  Location: [facility];  Service: Endoscopy;  Laterality: N/A;   FOOT SURGERY     right   HAND SURGERY       A IV Location/Drains/Wounds Patient Lines/Drains/Airways Status     Active Line/Drains/Airways     Name Placement date Placement time Site Days   Peripheral IV 03/05/23 20 G Anterior;Right Forearm 03/05/23  0034  Forearm  less than 1   Peripheral IV 03/05/23 20 G Left Forearm 03/05/23  0345  Forearm  less than 1            Intake/Output Last 24 hours  Intake/Output Summary (Last 24 hours) at 03/05/2023 1547 Last data filed at 03/05/2023 1052 Gross per 24 hour  Intake 2101.76 ml  Output 1480  ml  Net 621.76 ml    Labs/Imaging Results for orders placed or performed during the hospital encounter of 03/04/23 (from the past 48 hour(s))  Ethanol     Status: Abnormal   Collection Time: 03/04/23 11:20 PM  Result Value Ref Range   Alcohol, Ethyl (B) 188 (H) <10 mg/dL    Comment: (NOTE) Lowest detectable limit for serum alcohol is 10 mg/dL.  For medical purposes only. Performed at [facility], [address] [place], [place] [address]   Salicylate level     Status: Abnormal   Collection Time: 03/04/23 11:20 PM  Result Value Ref Range    Salicylate Lvl <7.0 (L) 7.0 - 30.0 mg/dL    Comment: Performed at [facility], [address], [place], [place] [address]  Acetaminophen level     Status: Abnormal   Collection Time: 03/04/23 11:20 PM  Result Value Ref Range   Acetaminophen (Tylenol), Serum <10 (L) 10 - 30 ug/mL    Comment: (NOTE) Therapeutic concentrations vary significantly. A range of 10-30 ug/mL  may be an effective concentration for many patients. However, some  are best treated at concentrations outside of this range. Acetaminophen concentrations >150 ug/mL at 4 hours after ingestion  and >50 ug/mL at 12 hours after ingestion are often associated with  toxic reactions.  Performed at [facility], [address] [place], [place] [address]   Comprehensive metabolic panel     Status: Abnormal   Collection Time: 03/04/23 11:20 PM  Result Value Ref Range   Sodium 117 (LL) 135 - 145 mmol/L    Comment: CRITICAL RESULT CALLED TO, READ BACK BY AND VERIFIED WITH F. FERNIDAS RN 03/05/23 @0120  BY J. WHITE   Potassium <2.0 (LL) 3.5 - 5.1 mmol/L    Comment: CRITICAL RESULT CALLED TO, READ BACK BY AND VERIFIED WITH F. FERNIDAS RN 03/05/23 @0120  BY J. WHITE   Chloride <65 (LL) 98 - 111 mmol/L    Comment: CRITICAL RESULT CALLED TO, READ BACK BY AND VERIFIED WITH F. FERNIDAS RN 03/05/23 @0120  BY J. WHITE   CO2 29 22 - 32 mmol/L   Glucose, Bld 155 (H) 70 - 99 mg/dL    Comment: Glucose reference range applies only to samples taken after fasting for at least 8 hours.   BUN 13 6 - 20 mg/dL   Creatinine, Ser 6.21 0.61 - 1.24 mg/dL   Calcium 5.4 (LL) 8.9 - 10.3 mg/dL    Comment: CRITICAL RESULT CALLED TO, READ BACK BY AND VERIFIED WITH F. FERNIDAS RN 03/05/23 @0120  BY J. WHITE   Total Protein 6.3 (L) 6.5 - 8.1 g/dL   Albumin 3.0 (L) 3.5 - 5.0 g/dL   AST 308 (H) 15 - 41 U/L   ALT 91 (H) 0 - 44 U/L   Alkaline Phosphatase 133 (H) 38 - 126 U/L   Total Bilirubin 2.1 (H) 0.3 - 1.2 mg/dL   GFR, Estimated >65 >78 mL/min     Comment: (NOTE) Calculated using the CKD-EPI Creatinine Equation (2021)    Anion gap NOT CALCULATED 5 - 15    Comment: ELECTROLYTES REPEATED TO VERIFY Performed at [facility], [address] [place], [place] [address]   Ammonia     Status: Abnormal   Collection Time: 03/05/23 12:34 AM  Result Value Ref Range   Ammonia 43 (H) 9 - 35 umol/L    Comment: Performed at [facility], [address] [place], [place] [address]  Comprehensive metabolic panel     Status: Abnormal   Collection Time: 03/05/23 12:34 AM  Result Value Ref Range   Sodium 115 (LL) 135 - 145 mmol/L    Comment: CRITICAL RESULT CALLED TO, READ BACK BY AND VERIFIED WITH F. FERNIDAS RN 03/05/23 @0120  BY J. WHITE   Potassium <2.0 (LL) 3.5 - 5.1 mmol/L    Comment: CRITICAL RESULT CALLED TO, READ BACK BY AND VERIFIED WITH F. FERNIDAS RN 03/05/23 @0120  BY J. WHITE   Chloride <65 (LL) 98 - 111 mmol/L    Comment: CRITICAL RESULT CALLED TO, READ BACK BY AND VERIFIED WITH F. FERNIDAS RN 03/05/23 @0120  BY J. WHITE   CO2 31 22 - 32 mmol/L   Glucose, Bld 139 (H) 70 - 99 mg/dL    Comment: Glucose reference range applies only to samples taken after fasting for at least 8 hours.   BUN 13 6 - 20 mg/dL   Creatinine, Ser 1.61 0.61 - 1.24 mg/dL   Calcium 5.1 (LL) 8.9 - 10.3 mg/dL    Comment: CRITICAL RESULT CALLED TO, READ BACK BY AND VERIFIED WITH F. FERNIDAS RN 03/05/23 @0120  BY J. WHITE   Total Protein 6.1 (L) 6.5 - 8.1 g/dL   Albumin 2.9 (L) 3.5 - 5.0 g/dL   AST 096 (H) 15 - 41 U/L   ALT 86 (H) 0 - 44 U/L   Alkaline Phosphatase 117 38 - 126 U/L   Total Bilirubin 2.2 (H) 0.3 - 1.2 mg/dL   GFR, Estimated >04 >54 mL/min    Comment: (NOTE) Calculated using the CKD-EPI Creatinine Equation (2021)    Anion gap NOT CALCULATED 5 - 15    Comment: ELECTROLYTES REPEATED TO VERIFY Performed at [facility], [address], [place], [place] [address]   Lipase, blood     Status: None   Collection Time: 03/05/23 12:34 AM   Result Value Ref Range   Lipase 36 11 - 51 U/L    Comment: Performed at [facility], [address] [place], [place] [address]  CBC     Status: Abnormal   Collection Time: 03/05/23 12:34 AM  Result Value Ref Range   WBC 13.1 (H) 4.0 - 10.5 K/uL   RBC 3.99 (L) 4.22 - 5.81 MIL/uL   Hemoglobin 13.8 13.0 - 17.0 g/dL   HCT 29.5 (L) 62.1 - 30.8 %   MCV 93.0 80.0 - 100.0 fL   MCH 34.6 (H) 26.0 - 34.0 pg   MCHC 37.2 (H) 30.0 - 36.0 g/dL   RDW 65.7 (H) 84.6 - 96.2 %   Platelets 318 150 - 400 K/uL   nRBC 0.6 (H) 0.0 - 0.2 %    Comment: Performed at [facility], [address], [place], [place] [address]  Protime-INR     Status: None   Collection Time: 03/05/23  1:22 AM  Result Value Ref Range   Prothrombin Time 14.2 11.4 - 15.2 seconds   INR 1.1 0.8 - 1.2    Comment: (NOTE) INR goal varies based on device and disease states. Performed at [facility], [address], [place], [place] [address]   Magnesium     Status: Abnormal   Collection Time: 03/05/23  1:22 AM  Result Value Ref Range   Magnesium 0.8 (LL) 1.7 - 2.4 mg/dL    Comment: CRITICAL RESULT CALLED TO, READ BACK BY AND VERIFIED WITH F. FREITAS RN 03/05/23 @0202  BY J. WHITE Performed at [facility],  [address], [place], [place] [address]   Na and K (sodium & potassium), rand urine     Status: None   Collection Time: 03/05/23  3:15 AM  Result Value Ref Range   Sodium, Ur <10 mmol/L   Potassium Urine 24 mmol/L    Comment: Performed at [facility], [address] [place], [place] [address]  Osmolality, urine     Status: None   Collection Time: 03/05/23  3:15 AM  Result Value Ref Range   Osmolality, Ur 472 300 - 900 mOsm/kg    Comment: Performed at [facility], [address] [place], [place] [address]  Magnesium     Status: None   Collection Time: 03/05/23  3:45 AM  Result Value Ref Range   Magnesium 1.8 1.7 - 2.4 mg/dL    Comment: Performed at [facility], [address] [place], [place] [address]  Basic metabolic panel     Status: Abnormal   Collection Time: 03/05/23  3:45 AM  Result Value Ref Range   Sodium 119 (LL) 135 - 145 mmol/L    Comment: CRITICAL RESULT CALLED TO, READ BACK BY AND VERIFIED WITH F. FRIETAS RN 03/05/23 @0505  BY J. WHITE   Potassium <2.0 (LL) 3.5 - 5.1 mmol/L    Comment: CRITICAL RESULT CALLED TO, READ BACK BY AND VERIFIED WITH F. FRIETAS RN 03/05/23 @0505  BY J. WHITE   Chloride <65 (LL) 98 - 111 mmol/L    Comment: CRITICAL RESULT CALLED TO, READ BACK BY AND VERIFIED WITH F. FRIETAS RN 03/05/23 @0505  BY J. WHITE   CO2 32 22 - 32 mmol/L   Glucose, Bld 151 (H) 70 - 99 mg/dL    Comment: Glucose reference range applies only to samples taken after fasting for at least 8 hours.   BUN 13 6 - 20 mg/dL   Creatinine, Ser 1.02 0.61 - 1.24 mg/dL   Calcium 5.2 (LL) 8.9 - 10.3 mg/dL    Comment: CRITICAL RESULT CALLED TO, READ BACK BY AND VERIFIED WITH F. FRIETAS RN 03/05/23 @0505  BY J. WHITE   GFR, Estimated >60 >60 mL/min    Comment: (NOTE) Calculated using the CKD-EPI Creatinine Equation (2021)    Anion gap NOT CALCULATED 5 - 15    Comment: ELECTROLYTES REPEATED TO VERIFY Performed at [facility], [address], [place], [place] [address]   HIV Antibody (routine testing w rflx)     Status: None   Collection Time: 03/05/23  3:45 AM  Result Value Ref Range   HIV Screen 4th Generation wRfx Non Reactive Non Reactive    Comment: Performed at [facility], [address], [place], [place] [address]  Phosphorus     Status: None   Collection Time: 03/05/23  3:45 AM  Result Value Ref Range   Phosphorus 3.6 2.5 - 4.6 mg/dL    Comment: Performed at [facility], [address] [place], [place] [address]  Hepatitis panel, acute     Status: None   Collection Time: 03/05/23  3:45 AM  Result Value Ref Range   Hepatitis B Surface Ag NON REACTIVE NON REACTIVE   HCV Ab NON REACTIVE NON REACTIVE    Comment: (NOTE) Nonreactive  HCV antibody screen is consistent with no HCV infections,  unless recent infection is suspected or other evidence exists to indicate HCV infection.     Hep A IgM NON REACTIVE NON REACTIVE   Hep B C IgM  NON REACTIVE NON REACTIVE    Comment: Performed at [facility], [address], [place], [place] [address]  APTT     Status: None   Collection Time: 03/05/23  3:45 AM  Result Value Ref Range   aPTT 33 24 - 36 seconds    Comment: Performed at [facility], [address], [place], [place] [address]  VITAMIN D 25 Hydroxy (Vit-D Deficiency, Fractures)     Status: Abnormal   Collection Time: 03/05/23  3:45 AM  Result Value Ref Range   Vit D, 25-Hydroxy 5.63 (L) 30 - 100 ng/mL    Comment: (NOTE) Vitamin D deficiency has been defined by the Institute of Medicine  and an Endocrine Society practice guideline as a level of serum 25-OH  vitamin D less than 20 ng/mL (1,2). The Endocrine Society went on to  further define vitamin D insufficiency as a level between 21 and 29  ng/mL (2).  1. IOM (Institute of Medicine). 2010. Dietary reference intakes for  calcium and D. [place]: [employer]. 2. Holick MF, Binkley [place], Bischoff-Ferrari HA, et al. Evaluation,  treatment, and prevention of vitamin D deficiency: an Endocrine  Society clinical practice guideline, JCEM. 2011 Jul; 96(7): 1911-30.  Performed at [facility], [address] [place], [place] [address]   CBC     Status: Abnormal   Collection Time: 03/05/23  4:34 AM  Result Value Ref Range   WBC 13.2 (H) 4.0 - 10.5 K/uL   RBC 4.12 (L) 4.22 - 5.81 MIL/uL   Hemoglobin 14.0 13.0 - 17.0 g/dL   HCT 91.4 (L) 78.2 - 95.6 %   MCV 92.5 80.0 - 100.0 fL   MCH 34.0 26.0 - 34.0 pg   MCHC 36.7 (H) 30.0 - 36.0 g/dL   RDW 21.3 (H) 08.6 - 57.8 %   Platelets 315 150 - 400 K/uL   nRBC 0.6 (H) 0.0 - 0.2 %    Comment: Performed at [facility], [address], [place], [place] [address]  Basic metabolic  panel     Status: Abnormal   Collection Time: 03/05/23  4:48 AM  Result Value Ref Range   Sodium 121 (L) 135 - 145 mmol/L   Potassium <2.0 (LL) 3.5 - 5.1 mmol/L    Comment: CRITICAL RESULT CALLED TO, READ BACK BY AND VERIFIED WITH F. FRIETAS RN 03/05/23 @0636  BY J. WHITE   Chloride <65 (LL) 98 - 111 mmol/L    Comment: CRITICAL RESULT CALLED TO, READ BACK BY AND VERIFIED WITH F. FRIETAS RN 03/05/23 @0636  BY J. WHITE   CO2 31 22 - 32 mmol/L   Glucose, Bld 137 (H) 70 - 99 mg/dL    Comment: Glucose reference range applies only to samples taken after fasting for at least 8 hours.   BUN 14 6 - 20 mg/dL   Creatinine, Ser 9.52 0.61 - 1.24 mg/dL   Calcium 6.4 (LL) 8.9 - 10.3 mg/dL    Comment: CRITICAL RESULT CALLED TO, READ BACK BY AND VERIFIED WITH F. FRIETAS RN 03/05/23 @0636  BY J. WHITE   GFR, Estimated >60 >60 mL/min    Comment: (NOTE) Calculated using the CKD-EPI Creatinine Equation (2021)    Anion gap NOT CALCULATED 5 - 15    Comment: ELECTROLYTES REPEATED TO VERIFY Performed at [facility], [address], [place], [place] [address]   Osmolality     Status: None   Collection Time: 03/05/23  4:48  AM  Result Value Ref Range   Osmolality 277 275 - 295 mOsm/kg    Comment: Performed at [facility], [address], [place], [place] [address]  Calcium     Status: Abnormal   Collection Time: 03/05/23  4:48 AM  Result Value Ref Range   Calcium 6.3 (LL) 8.9 - 10.3 mg/dL    Comment: CRITICAL RESULT CALLED TO, READ BACK BY AND VERIFIED WITH F. FRIETAS RN 03/05/23 @0622  BY J. WHITE Performed at [facility], [address], [place], [place] [address]   Hepatic function panel     Status: Abnormal   Collection Time: 03/05/23  4:48 AM  Result Value Ref Range   Total Protein 6.9 6.5 - 8.1 g/dL   Albumin 3.3 (L) 3.5 - 5.0 g/dL   AST 841 (H) 15 - 41 U/L   ALT 93 (H) 0 - 44 U/L   Alkaline Phosphatase 136 (H) 38 - 126 U/L   Total Bilirubin 2.3 (H) 0.3 - 1.2 mg/dL   Bilirubin,  Direct 1.1 (H) 0.0 - 0.2 mg/dL   Indirect Bilirubin 1.2 (H) 0.3 - 0.9 mg/dL    Comment: Performed at [facility], [address] [place], [place] [address]  Calcium     Status: Abnormal   Collection Time: 03/05/23 10:05 AM  Result Value Ref Range   Calcium 6.7 (L) 8.9 - 10.3 mg/dL    Comment: Performed at [facility], [address], [place], [place] [address]  Basic metabolic panel     Status: Abnormal   Collection Time: 03/05/23 12:46 PM  Result Value Ref Range   Sodium 119 (LL) 135 - 145 mmol/L    Comment: CRITICAL RESULT CALLED TO, READ BACK BY AND VERIFIED WITH M. ROBERTS, RN AT 1337 07.18.24 D. BLU   Potassium 2.2 (LL) 3.5 - 5.1 mmol/L    Comment: CRITICAL RESULT CALLED TO, READ BACK BY AND VERIFIED WITH M. ROBERTS, RN AT 1337 07.18.24 D. BLU   Chloride 69 (L) 98 - 111 mmol/L   CO2 36 (H) 22 - 32 mmol/L   Glucose, Bld 126 (H) 70 - 99 mg/dL    Comment: Glucose reference range applies only to samples taken after fasting for at least 8 hours.   BUN 12 6 - 20 mg/dL   Creatinine, Ser 5.36 0.61 - 1.24 mg/dL   Calcium 9.1 8.9 - 64.4 mg/dL    Comment: DELTA CHECK NOTED   GFR, Estimated >60 >60 mL/min    Comment: (NOTE) Calculated using the CKD-EPI Creatinine Equation (2021)    Anion gap 14 5 - 15    Comment: Performed at [facility], [address] [place], [place] [address]   [place] ASCITES (ABDOMEN LIMITED)  Result Date: 03/05/2023 CLINICAL DATA:  [id number] Ascites [id number] EXAM: LIMITED ABDOMEN ULTRASOUND FOR ASCITES TECHNIQUE: Limited ultrasound survey for ascites was performed in all four abdominal quadrants. COMPARISON:  Ct abd/pelvis 08/26/2021, ultrasound abdomen 09/07/2021 FINDINGS: No ascites.  Hepatic steatosis. IMPRESSION: 1. No ascites. 2. Hepatic steatosis. Electronically Signed   By: Tish Frederickson M.D.   On: 03/05/2023 03:53   CT HEAD WO CONTRAST ( )  Result Date: 03/05/2023 CLINICAL DATA:  Head trauma, abnormal mental status. EXAM: CT HEAD WITHOUT  CONTRAST TECHNIQUE: Contiguous axial images were obtained from the base of the skull through the vertex without intravenous contrast. RADIATION DOSE REDUCTION: This exam was performed according to the departmental dose-optimization program which includes automated exposure control, adjustment  of the mA and/or kV according to patient size and/or use of iterative reconstruction technique. COMPARISON:  09/07/2021. FINDINGS: Brain: No acute intracranial hemorrhage, midline shift or mass effect. No extra-axial fluid collection. Gray-white matter differentiation is within normal limits. No hydrocephalus. Vascular: No hyperdense vessel or unexpected calcification. Skull: Normal. Negative for fracture or focal lesion. Sinuses/Orbits: A small mucosal retention cyst or polyp is noted in the right maxillary sinus. Debris is noted in the posterior nasopharynx. No acute orbital abnormality. Other: None. IMPRESSION: No acute intracranial process. Electronically Signed   By: Thornell Sartorius M.D.   On: 03/05/2023 01:41   DG Knee Complete 4 Views Right  Result Date: 03/05/2023 CLINICAL DATA:  Fall with right knee injury. EXAM: RIGHT KNEE - COMPLETE 4+ VIEW COMPARISON:  Radiographs 03/21/2011 FINDINGS: No evidence of fracture, dislocation, or joint effusion. No evidence of arthropathy or other focal bone abnormality. Soft tissues are unremarkable. IMPRESSION: Negative. Electronically Signed   By: Minerva Fester M.D.   On: 03/05/2023 00:24    Pending Labs Unresulted Labs (From admission, onward)     Start     Ordered   03/12/23 0500  Creatinine, serum  (enoxaparin (LOVENOX)    CrCl >/= 30 ml/min)  Weekly,   R     Comments: while on enoxaparin therapy    03/05/23 0307   03/05/23 1333  Renal function panel  Now then every 4 hours,   R      03/05/23 1333   03/05/23 0353  Urinalysis, Routine w reflex microscopic -Urine, Clean Catch  Add-on,   AD       Question:  Specimen Source  Answer:  Urine, Clean Catch   03/05/23 0352    03/05/23 0300  Calcium, ionized  Add-on,   AD        03/05/23 0259   03/04/23 2313  Rapid urine drug screen (hospital performed)  Once,   STAT        03/04/23 2313            Vitals/Pain Today's Vitals   03/05/23 1200 03/05/23 1400 03/05/23 1438 03/05/23 1500  BP: 121/77 134/83  123/85  Pulse: 96 (!) 101  91  Resp: (!) 24 (!) 21  (!) 27  Temp:   97.8 F (36.6 C)   TempSrc:   Oral   SpO2: 95% 94%  96%  Weight:      Height:      PainSc:        Isolation Precautions No active isolations  Medications Medications  LORazepam (ATIVAN) tablet 1-4 mg (1 mg Oral Given 03/05/23 0956)    Or  LORazepam (ATIVAN) injection 1-4 mg ( Intravenous See Alternative 03/05/23 0956)  thiamine (VITAMIN B1) tablet 100 mg (100 mg Oral Given 03/05/23 0943)    Or  thiamine (VITAMIN B1) injection 100 mg ( Intravenous See Alternative 03/05/23 0943)  folic acid (FOLVITE) tablet 1 mg (1 mg Oral Given 03/05/23 0943)  allopurinol (ZYLOPRIM) tablet 100 mg (100 mg Oral Given 03/05/23 0944)  multivitamin with minerals tablet 1 tablet (1 tablet Oral Given 03/05/23 0944)  enoxaparin (LOVENOX) injection 40 mg (40 mg Subcutaneous Given 03/05/23 5621)  acetaminophen (TYLENOL) tablet 650 mg (has no administration in time range)  ondansetron (ZOFRAN) injection 4 mg (4 mg Intravenous Given 03/05/23 0631)  chlordiazePOXIDE (LIBRIUM) capsule 25 mg (has no administration in time range)  hydrOXYzine (ATARAX) tablet 25 mg (has no administration in time range)  loperamide (IMODIUM) capsule 2-4 mg (has no administration in  time range)  chlordiazePOXIDE (LIBRIUM) capsule 25 mg (25 mg Oral Given 03/05/23 0943)    Followed by  chlordiazePOXIDE (LIBRIUM) capsule 25 mg (has no administration in time range)    Followed by  chlordiazePOXIDE (LIBRIUM) capsule 25 mg (has no administration in time range)    Followed by  chlordiazePOXIDE (LIBRIUM) capsule 25 mg (has no administration in time range)  levETIRAcetam (KEPPRA) IVPB 500  mg/100 mL premix (0 mg Intravenous Stopped 03/05/23 0432)  potassium chloride (KLOR-CON) packet 80 mEq (80 mEq Oral Given 03/05/23 0623)  potassium chloride 10 mEq in 100 mL IVPB (0 mEq Intravenous Stopped 03/05/23 1533)  gabapentin (NEURONTIN) capsule 300 mg (300 mg Oral Given 03/05/23 0942)  metoprolol succinate (TOPROL-XL) 24 hr tablet 25 mg (25 mg Oral Given 03/05/23 0944)  QUEtiapine (SEROQUEL) tablet 200 mg (has no administration in time range)  lactulose (CHRONULAC) 10 GM/15ML solution 30 g (30 g Oral Given 03/05/23 0945)  potassium chloride 10 mEq in 100 mL IVPB (has no administration in time range)  levETIRAcetam (KEPPRA) IVPB 1500 mg/ 100 mL premix (0 mg Intravenous Stopped 03/05/23 0204)  magnesium sulfate IVPB 2 g 50 mL (0 g Intravenous Stopped 03/05/23 0309)  potassium chloride 10 mEq in 100 mL IVPB (0 mEq Intravenous Stopped 03/05/23 0856)  0.9 %  sodium chloride infusion (0 mLs Intravenous Stopped 03/05/23 0857)  magnesium sulfate IVPB 2 g 50 mL (0 g Intravenous Stopped 03/05/23 0413)  calcium gluconate 2 g/ 100 mL sodium chloride IVPB (0 mg Intravenous Stopped 03/05/23 0436)  calcium gluconate 2 g/ 100 mL sodium chloride IVPB (0 mg Intravenous Stopped 03/05/23 0537)  calcium gluconate 2 g/ 100 mL sodium chloride IVPB (0 mg Intravenous Stopped 03/05/23 1253)  calcium gluconate 2 g/ 100 mL sodium chloride IVPB (0 mg Intravenous Stopped 03/05/23 0855)    Mobility walks     Focused Assessments Neuro Assessment Handoff:  Swallow screen pass? Yes  Cardiac Rhythm: Sinus tachycardia       Neuro Assessment: Within Defined Limits Neuro Checks:      Has TPA been given? No If patient is a Neuro Trauma and patient is going to OR before floor call report to 4N Charge nurse: [phone] or [phone]   R Recommendations: See Admitting Provider Note  Report given to:   Additional Notes: will give due medications, this potassium bag will be the 4th and final bag to complete the ordered  number of 4.  It appears they just placed another 4 bags so this bag is 4 of 8 ordered. His K was less then 2 Pt had a huge loose BM lactulose my friend does the trick

## 2023-03-05 NOTE — ED Notes (Signed)
Recollect sent to lab

## 2023-03-05 NOTE — Progress Notes (Addendum)
-   Per nephrology Dr. Valentino Nose recommendation to hold the Lasix as of now until potassium normalized.  Also recommended aggressive correction of potassium and chloride to continue KCl for management of hypochloremia as well which will improve the sodium level.  -Given patient is alert oriented x 3, no evidence of lethargy and seizure per Dr. Valentino Nose recommendation wait for potassium to improve. Correcting potassium alone ameliorates the dangers associated with hyponatremia. Correcting hyponatremia before hypokalemia increases risk of mortality and osmotic demyelenation. If he does become symptomatic from hyponatremia then use 3% Nacl bolus.

## 2023-03-05 NOTE — Progress Notes (Signed)
[facility] PROGRESS NOTE  Aycen Porreca (DOB: [birth year]) ZOX:[id number] PCP: Georganna Skeans, MD  Brief Narrative: [name] is a 52 y.o. male with a history of alcohol abuse, withdrawal-related seizure, alcoholic cirrhosis, schizoaffective disorder, GERD who presented to the ED on 03/04/2023 by EMS due to generally feeling unwell, weakness, poor oral intake, and abdominal distention/discomfort. He was found to have many severe electrolyte derangements, early alcohol withdrawal symptoms  Subjective: Feels generally poor. Abdomen is more bloated than usual. No chest pain, dyspnea, cough, vomiting, bleeding, rashes. No dysuria.  Objective: BP 119/85   Pulse (!) 102   Temp 98.3 F (36.8 C) (Oral)   Resp (!) 24   Ht 6' (1.829 m)   Wt 99.8 kg   SpO2 93%   BMI 29.84 kg/m   Gen: Mildly ill-appearing male in no acute distress Pulm: Clear, nonlabored  CV: Regular mild tachycardia, trace LE edema GI: Soft, distended without focal tenderness or fluid wave, hypoactive BS. Neuro: Alert, oriented, interactive, cooperative without focal weakness. Normal speech. Ext: Warm, no deformities. Skin: No jaundice or bleeding/exudative wounds noted on visualized skin   CT head: nonacute  Assessment & Plan: Hypokalemia, hypochloremia: Undetectably low, refractory to supplementation thus far.  - Repeat IV and po supplementation with KCl and monitor labs serially. On spiro. - Maintain cardiac monitoring  Hypocalcemia: Improving. No tetany currently on exam.  - Continue supplementation and monitoring.   Asymptomatic, severe hyponatremia: Pt remains asymptomatic but clinically quite precarious at this time. Needs at least PCU level of care, low threshold for ICU transfer. Last Na level several months prior was 137, historically runs in 130's.  - Urine sodium < 10, serum osm 277.  - He does have mild edema but no ascites on U/S, may be due to hypoalbuminemia. He no longer appears grossly  overloaded, may consider IV fluids depending on his oral intake going forward. - Na level rising 117 > 121 without directed treatment. Nephrology consulted, appreciate recommendations.  - Continuing spironolactone  Alcohol abuse, early withdrawal: EtOH level on admit was 188, last drink per pt 7/19 afternoon.  - Continue librium and prn ativan 1-4mg  per [facility]. - UDS pending - Discriminant function low, no indication for steroids.   History of seizure in setting of alcohol withdrawal: Maintained on chronic keppra, though hasn't been taking.  - s/p keppra load in ED, continue 500mg  IV BID - Seizure precautions  Alcoholic cirrhosis: Established with [facility]. EGD 05/05/20 w/grade 1 varices, mild portal HTN gastropathy. INR 1.1.  - Mentating adequately at this time. Ammonia 43. Continue lactulose given his history of hepatic encephalopathy.  - Also hx GERD, continue PPI  Schizoaffective disorder:  - Hold SSRI in setting of severe hyponatremia currently - Continue seroquel qHS  Fall at home: R knee XR negative.  - PT evaluation once more stable. - Fall precautions  Gout: Chronic - Continue allopurinol.  Peripheral neuropathy:  - Continue gabapentin to avoid withdrawal.  Leukocytosis: No fever. No ascites on U/S. No urinary symptoms or respiratory symptoms.  - Continue monitoring off antibiotics at this time.   HTN:  - Continue home metoprolol. With hx grade 1 varices, would consider switch to nonselective BB.  Tyrone Nine, MD [facility] [email or web address] 03/05/2023, 8:47 AM

## 2023-03-05 NOTE — ED Notes (Signed)
Ca 5.2, Cl <65, Na 119, K <2.0

## 2023-03-05 NOTE — ED Notes (Signed)
Placed pt on 2L 02 per [place]. 

## 2023-03-05 NOTE — Consult Note (Signed)
[facility] Renal Consultation Note  Requesting MD: Dr. Jarvis Newcomer Indication for Consultation: Severe electrolyte abnormality  HPI:  [name] is a 52 y.o. male with past medical history of alcohol use disorder, alcoholic liver cirrhosis,PTSD, psychosis, schizoaffective disorder, who presented to the ED complaining of not feeling well. He states that for the last month he has been drinking malt liquor daily until he blacks out. He also endorses not eating very much during this time, with frequent bouts of vomiting and diarrhea. He has stopped taking his medications such as keppra, seroquel, metoprolol, and spironolactone.   Creat  Date/Time Value Ref Range Status  09/13/2015 09:46 AM 0.93 0.60 - 1.35 mg/dL Final  13/24/4010 27:25 PM 0.87 0.50 - 1.35 mg/dL Final   Creatinine, Ser  Date/Time Value Ref Range Status  03/05/2023 04:48 AM 0.98 0.61 - 1.24 mg/dL Final  36/64/4034 74:25 AM 0.93 0.61 - 1.24 mg/dL Final  95/63/8756 43:32 AM 1.03 0.61 - 1.24 mg/dL Final  95/18/8416 60:63 PM 1.00 0.61 - 1.24 mg/dL Final  01/60/1093 23:55 PM 1.02 0.76 - 1.27 mg/dL Final  73/22/0254 27:06 AM 1.00 0.61 - 1.24 mg/dL Final  23/76/2831 51:76 AM 1.02 0.61 - 1.24 mg/dL Final  16/02/3709 62:69 AM 0.76 0.61 - 1.24 mg/dL Final  48/54/6270 35:00 AM 1.06 0.61 - 1.24 mg/dL Final  93/81/8299 37:16 AM 1.17 0.61 - 1.24 mg/dL Final  96/78/9381 01:75 AM 0.86 0.61 - 1.24 mg/dL Final  06/11/8526 78:24 AM 0.95 0.61 - 1.24 mg/dL Final  23/53/6144 31:54 AM 0.85 0.61 - 1.24 mg/dL Final  00/86/7619 50:93 AM 0.78 0.61 - 1.24 mg/dL Final  26/71/2458 09:98 PM 1.05 0.61 - 1.24 mg/dL Final  33/82/5053 97:67 AM 0.96 0.61 - 1.24 mg/dL Final  34/19/3790 24:09 AM 0.89 0.61 - 1.24 mg/dL Final  73/53/2992 42:68 AM 0.96 0.61 - 1.24 mg/dL Final  34/19/6222 97:98 AM 0.86 0.61 - 1.24 mg/dL Final  92/06/9416 40:81 AM 0.95 0.61 - 1.24 mg/dL Final  44/81/8563 14:97 AM 0.94 0.61 - 1.24 mg/dL Final  02/63/7858 85:02 AM  0.91 0.61 - 1.24 mg/dL Final  77/41/2878 67:67 PM 0.98 0.61 - 1.24 mg/dL Final  20/94/7096 28:36 AM 0.99 0.61 - 1.24 mg/dL Final  62/94/7654 65:03 AM 0.73 0.61 - 1.24 mg/dL Final  54/65/6812 75:17 AM 0.73 0.61 - 1.24 mg/dL Final  00/17/4944 96:75 AM 1.00 0.76 - 1.27 mg/dL Final  91/63/8466 59:93 AM 0.96 0.40 - 1.50 mg/dL Final  57/08/7791 90:30 PM 1.14 0.76 - 1.27 mg/dL Final  05/10/3006 62:26 PM 1.04 0.61 - 1.24 mg/dL Final  33/35/4562 56:38 AM 1.30 (H) 0.61 - 1.24 mg/dL Final  93/73/4287 68:11 AM 1.13 0.61 - 1.24 mg/dL Final  57/26/2035 59:74 AM 1.06 0.61 - 1.24 mg/dL Final  16/38/4536 46:80 AM 1.10 0.61 - 1.24 mg/dL Final  32/07/2481 50:03 AM 0.92 0.61 - 1.24 mg/dL Final  70/48/8891 69:45 AM 0.95 0.61 - 1.24 mg/dL Final  03/88/8280 03:49 AM 0.86 0.61 - 1.24 mg/dL Final  17/91/5056 97:94 AM 0.79 0.61 - 1.24 mg/dL Final  80/16/5537 48:27 AM 0.86 0.61 - 1.24 mg/dL Final  07/86/7544 92:01 AM 0.89 0.61 - 1.24 mg/dL Final  00/71/2197 58:83 AM 0.84 0.61 - 1.24 mg/dL Final  25/49/8264 15:83 AM 0.97 0.61 - 1.24 mg/dL Final  09/40/7680 88:11 AM 0.94 0.61 - 1.24 mg/dL Final  10/30/9456 59:29 AM 0.89 0.61 - 1.24 mg/dL Final  24/46/2863 81:77 PM 0.87 0.61 - 1.24 mg/dL Final  11/65/7903 83:33 AM 1.01 0.61 - 1.24 mg/dL Final  07/21/2020 06:48 AM 1.22 0.61 - 1.24 mg/dL Final  64/33/2951 88:41 AM 1.21 0.61 - 1.24 mg/dL Final  66/01/3015 01:09 AM 1.40 (H) 0.61 - 1.24 mg/dL Final  32/35/5732 20:25 PM 1.69 (H) 0.61 - 1.24 mg/dL Final  42/70/6237 62:83 AM 1.61 (H) 0.61 - 1.24 mg/dL Final  15/17/6160 73:71 PM 1.62 (H) 0.61 - 1.24 mg/dL Final     PMHx:   Past Medical History:  Diagnosis Date   Alcohol abuse    Anxiety    at age 80   Blood transfusion without reported diagnosis    Cirrhosis (HCC)    Depression    at age 61   GERD (gastroesophageal reflux disease)    Hypertension    Nausea and vomiting    Psychosis (HCC)    PTSD (post-traumatic stress disorder)    Schizoaffective disorder     Seizure disorder (HCC)    related to etoh seizure   Symptomatic anemia     Past Surgical History:  Procedure Laterality Date   ESOPHAGOGASTRODUODENOSCOPY (EGD) WITH PROPOFOL N/A 05/05/2020   Procedure: ESOPHAGOGASTRODUODENOSCOPY (EGD) WITH PROPOFOL;  Surgeon: Napoleon Form, MD;  Location: [facility];  Service: Endoscopy;  Laterality: N/A;   FOOT SURGERY     right   HAND SURGERY      Family Hx:  Family History  Problem Relation Age of Onset   Alcoholism Mother    CAD Father    Depression Brother    Colon polyps Neg Hx    Esophageal cancer Neg Hx    Pancreatic cancer Neg Hx    Stomach cancer Neg Hx     Social History:  reports that he has been smoking cigarettes. He has a 45 pack-year smoking history. He has never used smokeless tobacco. He reports current alcohol use. He reports current drug use. Frequency: 1.00 time per week. Drug: Marijuana.  Allergies:  Allergies  Allergen Reactions   Bupropion Other (See Comments)    Caused seizures Caused seizures Feel as though I will have a seizure when taking this med    Medications: Prior to Admission medications   Medication Sig Start Date End Date Taking? Authorizing Provider  allopurinol (ZYLOPRIM) 100 MG tablet Take 1 tablet (100 mg total) by mouth daily. 07/30/22  Yes Georganna Skeans, MD  gabapentin (NEURONTIN) 300 MG capsule Take 1 capsule (300 mg total) by mouth 3 (three) times daily. 07/30/22  Yes Georganna Skeans, MD  ibuprofen (ADVIL) 200 MG tablet Take 600 mg by mouth every 6 (six) hours as needed for headache or moderate pain.   Yes [provider]  lactulose (CHRONULAC) 10 GM/15ML solution TAKE 45 MLS (30 G TOTAL) BY MOUTH 2 (TWO) TIMES DAILY. 12/03/22 03/13/23 Yes Georganna Skeans, MD  levETIRAcetam (KEPPRA) 500 MG tablet Take 1 tablet (500 mg total) by mouth 2 (two) times daily. 07/30/22  Yes Georganna Skeans, MD  metoprolol succinate (TOPROL-XL) 25 MG 24 hr tablet Take 1 tablet (25 mg total) by mouth  daily. 07/30/22  Yes Georganna Skeans, MD  pantoprazole (PROTONIX) 40 MG tablet Take 1 tablet (40 mg total) by mouth daily. 07/30/22  Yes Georganna Skeans, MD  QUEtiapine (SEROQUEL) 200 MG tablet Take 1 tablet (200 mg total) by mouth at bedtime. 07/30/22  Yes Georganna Skeans, MD  sertraline (ZOLOFT) 25 MG tablet Take 1 tablet (25 mg total) by mouth daily. 07/30/22  Yes Georganna Skeans, MD  spironolactone (ALDACTONE) 100 MG tablet Take 1 tablet (100 mg total) by mouth daily. 07/30/22  Yes Wilson,  Lauris Poag, MD    I have reviewed the patient's current medications.  Labs:  Results for orders placed or performed during the hospital encounter of 03/04/23 (from the past 48 hour(s))  Ethanol     Status: Abnormal   Collection Time: 03/04/23 11:20 PM  Result Value Ref Range   Alcohol, Ethyl (B) 188 (H) <10 mg/dL    Comment: (NOTE) Lowest detectable limit for serum alcohol is 10 mg/dL.  For medical purposes only. Performed at [facility], [address], [place], [place] [address]   Salicylate level     Status: Abnormal   Collection Time: 03/04/23 11:20 PM  Result Value Ref Range   Salicylate Lvl <7.0 (L) 7.0 - 30.0 mg/dL    Comment: Performed at [facility], [address] [place], [place] [address]  Acetaminophen level     Status: Abnormal   Collection Time: 03/04/23 11:20 PM  Result Value Ref Range   Acetaminophen (Tylenol), Serum <10 (L) 10 - 30 ug/mL    Comment: (NOTE) Therapeutic concentrations vary significantly. A range of 10-30 ug/mL  may be an effective concentration for many patients. However, some  are best treated at concentrations outside of this range. Acetaminophen concentrations >150 ug/mL at 4 hours after ingestion  and >50 ug/mL at 12 hours after ingestion are often associated with  toxic reactions.  Performed at [facility], [address], [place], [place] [address]   Comprehensive metabolic panel     Status: Abnormal   Collection Time: 03/04/23 11:20  PM  Result Value Ref Range   Sodium 117 (LL) 135 - 145 mmol/L    Comment: CRITICAL RESULT CALLED TO, READ BACK BY AND VERIFIED WITH F. FERNIDAS RN 03/05/23 @0120  BY J. WHITE   Potassium <2.0 (LL) 3.5 - 5.1 mmol/L    Comment: CRITICAL RESULT CALLED TO, READ BACK BY AND VERIFIED WITH F. FERNIDAS RN 03/05/23 @0120  BY J. WHITE   Chloride <65 (LL) 98 - 111 mmol/L    Comment: CRITICAL RESULT CALLED TO, READ BACK BY AND VERIFIED WITH F. FERNIDAS RN 03/05/23 @0120  BY J. WHITE   CO2 29 22 - 32 mmol/L   Glucose, Bld 155 (H) 70 - 99 mg/dL    Comment: Glucose reference range applies only to samples taken after fasting for at least 8 hours.   BUN 13 6 - 20 mg/dL   Creatinine, Ser 6.21 0.61 - 1.24 mg/dL   Calcium 5.4 (LL) 8.9 - 10.3 mg/dL    Comment: CRITICAL RESULT CALLED TO, READ BACK BY AND VERIFIED WITH F. FERNIDAS RN 03/05/23 @0120  BY J. WHITE   Total Protein 6.3 (L) 6.5 - 8.1 g/dL   Albumin 3.0 (L) 3.5 - 5.0 g/dL   AST 308 (H) 15 - 41 U/L   ALT 91 (H) 0 - 44 U/L   Alkaline Phosphatase 133 (H) 38 - 126 U/L   Total Bilirubin 2.1 (H) 0.3 - 1.2 mg/dL   GFR, Estimated >65 >78 mL/min    Comment: (NOTE) Calculated using the CKD-EPI Creatinine Equation (2021)    Anion gap NOT CALCULATED 5 - 15    Comment: ELECTROLYTES REPEATED TO VERIFY Performed at [facility], [address], [place], [place] [address]   Ammonia     Status: Abnormal   Collection Time: 03/05/23 12:34 AM  Result Value Ref Range   Ammonia 43 (H) 9 - 35 umol/L    Comment: Performed at [facility], [address] [place], [place] [address]  Comprehensive metabolic panel     Status: Abnormal   Collection Time: 03/05/23 12:34 AM  Result Value Ref Range   Sodium 115 (LL) 135 - 145 mmol/L    Comment: CRITICAL RESULT CALLED TO, READ BACK BY AND VERIFIED WITH F. FERNIDAS RN 03/05/23 @0120  BY J. WHITE   Potassium <2.0 (LL) 3.5 - 5.1 mmol/L    Comment: CRITICAL RESULT CALLED TO, READ BACK BY AND VERIFIED WITH F. FERNIDAS RN  03/05/23 @0120  BY J. WHITE   Chloride <65 (LL) 98 - 111 mmol/L    Comment: CRITICAL RESULT CALLED TO, READ BACK BY AND VERIFIED WITH F. FERNIDAS RN 03/05/23 @0120  BY J. WHITE   CO2 31 22 - 32 mmol/L   Glucose, Bld 139 (H) 70 - 99 mg/dL    Comment: Glucose reference range applies only to samples taken after fasting for at least 8 hours.   BUN 13 6 - 20 mg/dL   Creatinine, Ser 4.09 0.61 - 1.24 mg/dL   Calcium 5.1 (LL) 8.9 - 10.3 mg/dL    Comment: CRITICAL RESULT CALLED TO, READ BACK BY AND VERIFIED WITH F. FERNIDAS RN 03/05/23 @0120  BY J. WHITE   Total Protein 6.1 (L) 6.5 - 8.1 g/dL   Albumin 2.9 (L) 3.5 - 5.0 g/dL   AST 811 (H) 15 - 41 U/L   ALT 86 (H) 0 - 44 U/L   Alkaline Phosphatase 117 38 - 126 U/L   Total Bilirubin 2.2 (H) 0.3 - 1.2 mg/dL   GFR, Estimated >91 >47 mL/min    Comment: (NOTE) Calculated using the CKD-EPI Creatinine Equation (2021)    Anion gap NOT CALCULATED 5 - 15    Comment: ELECTROLYTES REPEATED TO VERIFY Performed at [facility], [address] [place], [place] [address]   Lipase, blood     Status: None   Collection Time: 03/05/23 12:34 AM  Result Value Ref Range   Lipase 36 11 - 51 U/L    Comment: Performed at [facility], [name] [place], [place] [address]  CBC     Status: Abnormal   Collection Time: 03/05/23 12:34 AM  Result Value Ref Range   WBC 13.1 (H) 4.0 - 10.5 K/uL   RBC 3.99 (L) 4.22 - 5.81 MIL/uL   Hemoglobin 13.8 13.0 - 17.0 g/dL   HCT 65.7 (L) 84.6 - 96.2 %   MCV 93.0 80.0 - 100.0 fL   MCH 34.6 (H) 26.0 - 34.0 pg   MCHC 37.2 (H) 30.0 - 36.0 g/dL   RDW 95.2 (H) 84.1 - 32.4 %   Platelets 318 150 - 400 K/uL   nRBC 0.6 (H) 0.0 - 0.2 %    Comment: Performed at [facility], [address] [place], [place] [address]  Protime-INR     Status: None   Collection Time: 03/05/23  1:22 AM  Result Value Ref Range   Prothrombin Time 14.2 11.4 - 15.2 seconds   INR 1.1 0.8 - 1.2    Comment: (NOTE) INR goal varies based on  device and disease states. Performed at [facility], [address], [place], [place] [address]   Magnesium     Status: Abnormal   Collection Time: 03/05/23  1:22 AM  Result Value Ref Range   Magnesium 0.8 (LL) 1.7 - 2.4 mg/dL    Comment: CRITICAL RESULT CALLED TO, READ BACK BY AND VERIFIED WITH F. FREITAS RN 03/05/23 @0202  BY J. WHITE  Performed at [facility], [address], [place], [place] [address]   Na and K (sodium & potassium), rand urine     Status: None   Collection Time: 03/05/23  3:15 AM  Result Value Ref Range   Sodium, Ur <10 mmol/L   Potassium Urine 24 mmol/L    Comment: Performed at [facility], [address] [place], [place] [address]  Osmolality, urine     Status: None   Collection Time: 03/05/23  3:15 AM  Result Value Ref Range   Osmolality, Ur 472 300 - 900 mOsm/kg    Comment: Performed at [facility], [address] [place], [place] [address]  Magnesium     Status: None   Collection Time: 03/05/23  3:45 AM  Result Value Ref Range   Magnesium 1.8 1.7 - 2.4 mg/dL    Comment: Performed at [facility], [address], [place], [place] [address]  Basic metabolic panel     Status: Abnormal   Collection Time: 03/05/23  3:45 AM  Result Value Ref Range   Sodium 119 (LL) 135 - 145 mmol/L    Comment: CRITICAL RESULT CALLED TO, READ BACK BY AND VERIFIED WITH F. FRIETAS RN 03/05/23 @0505  BY J. WHITE   Potassium <2.0 (LL) 3.5 - 5.1 mmol/L    Comment: CRITICAL RESULT CALLED TO, READ BACK BY AND VERIFIED WITH F. FRIETAS RN 03/05/23 @0505  BY J. WHITE   Chloride <65 (LL) 98 - 111 mmol/L    Comment: CRITICAL RESULT CALLED TO, READ BACK BY AND VERIFIED WITH F. FRIETAS RN 03/05/23 @0505  BY J. WHITE   CO2 32 22 - 32 mmol/L   Glucose, Bld 151 (H) 70 - 99 mg/dL    Comment: Glucose reference range applies only to samples taken after fasting for at least 8 hours.   BUN 13 6 - 20 mg/dL   Creatinine, Ser 2.95 0.61 - 1.24 mg/dL   Calcium 5.2 (LL) 8.9  - 10.3 mg/dL    Comment: CRITICAL RESULT CALLED TO, READ BACK BY AND VERIFIED WITH F. FRIETAS RN 03/05/23 @0505  BY J. WHITE   GFR, Estimated >60 >60 mL/min    Comment: (NOTE) Calculated using the CKD-EPI Creatinine Equation (2021)    Anion gap NOT CALCULATED 5 - 15    Comment: ELECTROLYTES REPEATED TO VERIFY Performed at [facility], [address], [place], [place] [address]   HIV Antibody (routine testing w rflx)     Status: None   Collection Time: 03/05/23  3:45 AM  Result Value Ref Range   HIV Screen 4th Generation wRfx Non Reactive Non Reactive    Comment: Performed at [facility], [address] [place], [place] [address]  Phosphorus     Status: None   Collection Time: 03/05/23  3:45 AM  Result Value Ref Range   Phosphorus 3.6 2.5 - 4.6 mg/dL    Comment: Performed at [facility], [address] [place], [place] [address]  Hepatitis panel, acute     Status: None   Collection Time: 03/05/23  3:45 AM  Result Value Ref Range   Hepatitis B Surface Ag NON REACTIVE NON REACTIVE   HCV Ab NON REACTIVE NON REACTIVE    Comment: (NOTE) Nonreactive HCV antibody screen is consistent with no HCV infections,  unless recent infection is suspected or other evidence exists to indicate HCV infection.     Hep A IgM NON REACTIVE NON REACTIVE  Hep B C IgM NON REACTIVE NON REACTIVE    Comment: Performed at [facility], [address], [place], [place] [address]  APTT     Status: None   Collection Time: 03/05/23  3:45 AM  Result Value Ref Range   aPTT 33 24 - 36 seconds    Comment: Performed at [facility], [address] [place], [place] [address]  VITAMIN D 25 Hydroxy (Vit-D Deficiency, Fractures)     Status: Abnormal   Collection Time: 03/05/23  3:45 AM  Result Value Ref Range   Vit D, 25-Hydroxy 5.63 (L) 30 - 100 ng/mL    Comment: (NOTE) Vitamin D deficiency has been defined by the Institute of Medicine  and an Endocrine Society practice guideline  as a level of serum 25-OH  vitamin D less than 20 ng/mL (1,2). The Endocrine Society went on to  further define vitamin D insufficiency as a level between 21 and 29  ng/mL (2).  1. IOM (Institute of Medicine). 2010. Dietary reference intakes for  calcium and D. [place]: [employer]. 2. Holick MF, Binkley Mansfield, Bischoff-Ferrari HA, et al. Evaluation,  treatment, and prevention of vitamin D deficiency: an Endocrine  Society clinical practice guideline, JCEM. 2011 Jul; 96(7): 1911-30.  Performed at [facility], [address], [place], [place] [address]   CBC     Status: Abnormal   Collection Time: 03/05/23  4:34 AM  Result Value Ref Range   WBC 13.2 (H) 4.0 - 10.5 K/uL   RBC 4.12 (L) 4.22 - 5.81 MIL/uL   Hemoglobin 14.0 13.0 - 17.0 g/dL   HCT 91.4 (L) 78.2 - 95.6 %   MCV 92.5 80.0 - 100.0 fL   MCH 34.0 26.0 - 34.0 pg   MCHC 36.7 (H) 30.0 - 36.0 g/dL   RDW 21.3 (H) 08.6 - 57.8 %   Platelets 315 150 - 400 K/uL   nRBC 0.6 (H) 0.0 - 0.2 %    Comment: Performed at [facility], [address] [place], [place] [address]  Basic metabolic panel     Status: Abnormal   Collection Time: 03/05/23  4:48 AM  Result Value Ref Range   Sodium 121 (L) 135 - 145 mmol/L   Potassium <2.0 (LL) 3.5 - 5.1 mmol/L    Comment: CRITICAL RESULT CALLED TO, READ BACK BY AND VERIFIED WITH F. FRIETAS RN 03/05/23 @0636  BY J. WHITE   Chloride <65 (LL) 98 - 111 mmol/L    Comment: CRITICAL RESULT CALLED TO, READ BACK BY AND VERIFIED WITH F. FRIETAS RN 03/05/23 @0636  BY J. WHITE   CO2 31 22 - 32 mmol/L   Glucose, Bld 137 (H) 70 - 99 mg/dL    Comment: Glucose reference range applies only to samples taken after fasting for at least 8 hours.   BUN 14 6 - 20 mg/dL   Creatinine, Ser 9.52 0.61 - 1.24 mg/dL   Calcium 6.4 (LL) 8.9 - 10.3 mg/dL    Comment: CRITICAL RESULT CALLED TO, READ BACK BY AND VERIFIED WITH F. FRIETAS RN 03/05/23 @0636  BY J. WHITE   GFR, Estimated >60 >60 mL/min     Comment: (NOTE) Calculated using the CKD-EPI Creatinine Equation (2021)    Anion gap NOT CALCULATED 5 - 15    Comment: ELECTROLYTES REPEATED TO VERIFY Performed at [facility], [address], [place], [place] [address]   Osmolality     Status: None  Collection Time: 03/05/23  4:48 AM  Result Value Ref Range   Osmolality 277 275 - 295 mOsm/kg    Comment: Performed at [facility], [address], [place], [place] [address]  Calcium     Status: Abnormal   Collection Time: 03/05/23  4:48 AM  Result Value Ref Range   Calcium 6.3 (LL) 8.9 - 10.3 mg/dL    Comment: CRITICAL RESULT CALLED TO, READ BACK BY AND VERIFIED WITH F. FRIETAS RN 03/05/23 @0622  BY J. WHITE Performed at [facility], [address] [place], [place] [address]   Hepatic function panel     Status: Abnormal   Collection Time: 03/05/23  4:48 AM  Result Value Ref Range   Total Protein 6.9 6.5 - 8.1 g/dL   Albumin 3.3 (L) 3.5 - 5.0 g/dL   AST 295 (H) 15 - 41 U/L   ALT 93 (H) 0 - 44 U/L   Alkaline Phosphatase 136 (H) 38 - 126 U/L   Total Bilirubin 2.3 (H) 0.3 - 1.2 mg/dL   Bilirubin, Direct 1.1 (H) 0.0 - 0.2 mg/dL   Indirect Bilirubin 1.2 (H) 0.3 - 0.9 mg/dL    Comment: Performed at [facility], [address] [place], [place] [address]  Calcium     Status: Abnormal   Collection Time: 03/05/23 10:05 AM  Result Value Ref Range   Calcium 6.7 (L) 8.9 - 10.3 mg/dL    Comment: Performed at [facility], [address], [place], [place] [address]     ROS:  Pertinent items are noted in HPI.  Physical Exam: Vitals:   03/05/23 1100 03/05/23 1200  BP: 119/79 121/77  Pulse: 100 96  Resp: (!) 31 (!) 24  Temp:    SpO2: 97% 95%     General: Ill-apperaing male, in no acute distress HEENT: Dry mucuous membranes Eyes: PERRLA Neck: NO JVD noticed Heart: regular rate and rhythm Lungs: Lungs clear to auscultation bilaterally Abdomen: Moderately distended, non-tender to  palpation Extremities: No lower extremity edema noted Skin: No rashes Neuro: AAOx4, no focal deficits.   Assessment/Plan: Low Solute Hyponatremia - Likely in the setting of severely decreased PO protein intake. Current sodium at 121, expect this to self correct with correction of other electrolytes. He does not have any neurological deficits on my examination, will continue to monitor. Holding spironolactone. Sodium goal of no more than 125 within the next 24 hours.  Metabolic Alkalosis - Likely in the setting of repeated bouts of vomiting, zofran as needed   Severe Hypokalemia - Continues to have potassium <2, has had in total 140 mEQ of potassium. Severe hypokalemia likely in the setting of PO intake and continued alcohol use. Will continue supplementation and obtain renal function panels Q6hrs. No urgent need for dialysis at this time, will continue to monitor.  Hypocalcemia - Has corrected currently, will continue to monitor.  Concern for alcohol withdrawal - CIWA protocol with ativan per primary    Briawna Carver 03/05/2023, 1:33 PM

## 2023-03-06 DIAGNOSIS — F251 Schizoaffective disorder, depressive type: Secondary | ICD-10-CM | POA: Diagnosis not present

## 2023-03-06 DIAGNOSIS — F4310 Post-traumatic stress disorder, unspecified: Secondary | ICD-10-CM | POA: Diagnosis not present

## 2023-03-06 DIAGNOSIS — F10130 Alcohol abuse with withdrawal, uncomplicated: Secondary | ICD-10-CM

## 2023-03-06 DIAGNOSIS — F10930 Alcohol use, unspecified with withdrawal, uncomplicated: Secondary | ICD-10-CM

## 2023-03-06 LAB — BASIC METABOLIC PANEL
Anion gap: 16 — ABNORMAL HIGH (ref 5–15)
BUN: 12 mg/dL (ref 6–20)
CO2: 37 mmol/L — ABNORMAL HIGH (ref 22–32)
Calcium: 7.8 mg/dL — ABNORMAL LOW (ref 8.9–10.3)
Chloride: 77 mmol/L — ABNORMAL LOW (ref 98–111)
Creatinine, Ser: 1.14 mg/dL (ref 0.61–1.24)
GFR, Estimated: >60 mL/min (ref >60)
Glucose, Bld: 160 mg/dL — ABNORMAL HIGH (ref 70–99)
Potassium: 2.9 mmol/L — ABNORMAL LOW (ref 3.5–5.1)
Sodium: 130 mmol/L — ABNORMAL LOW (ref 135–145)

## 2023-03-06 LAB — CBC
HCT: 31.7 % — ABNORMAL LOW (ref 39.0–52.0)
Hemoglobin: 11.5 g/dL — ABNORMAL LOW (ref 13.0–17.0)
MCH: 34.8 pg — ABNORMAL HIGH (ref 26.0–34.0)
MCHC: 36.3 g/dL — ABNORMAL HIGH (ref 30.0–36.0)
MCV: 96.1 fL (ref 80.0–100.0)
Platelets: 186 10*3/uL (ref 150–400)
RBC: 3.3 MIL/uL — ABNORMAL LOW (ref 4.22–5.81)
RDW: 18.6 % — ABNORMAL HIGH (ref 11.5–15.5)
WBC: 6 10*3/uL (ref 4.0–10.5)
nRBC: 0.8 % — ABNORMAL HIGH (ref 0.0–0.2)

## 2023-03-06 LAB — RENAL FUNCTION PANEL
Albumin: 2.4 g/dL — ABNORMAL LOW (ref 3.5–5.0)
Albumin: 2 g/dL — ABNORMAL LOW (ref 3.5–5.0)
Anion gap: 9 (ref 5–15)
Anion gap: 10 (ref 5–15)
BUN: 11 mg/dL (ref 6–20)
BUN: <5 mg/dL — ABNORMAL LOW (ref 6–20)
CO2: 37 mmol/L — ABNORMAL HIGH (ref 22–32)
CO2: 30 mmol/L (ref 22–32)
Calcium: 6.9 mg/dL — ABNORMAL LOW (ref 8.9–10.3)
Calcium: 7.9 mg/dL — ABNORMAL LOW (ref 8.9–10.3)
Chloride: 82 mmol/L — ABNORMAL LOW (ref 98–111)
Chloride: 98 mmol/L (ref 98–111)
Creatinine, Ser: 1 mg/dL (ref 0.61–1.24)
Creatinine, Ser: 1.23 mg/dL (ref 0.61–1.24)
GFR, Estimated: >60 mL/min (ref >60)
GFR, Estimated: >60 mL/min (ref >60)
Glucose, Bld: 110 mg/dL — ABNORMAL HIGH (ref 70–99)
Glucose, Bld: 80 mg/dL (ref 70–99)
Phosphorus: 3.2 mg/dL (ref 2.5–4.6)
Phosphorus: 2.2 mg/dL — ABNORMAL LOW (ref 2.5–4.6)
Potassium: 2.3 mmol/L — CL (ref 3.5–5.1)
Potassium: 3.6 mmol/L (ref 3.5–5.1)
Sodium: 128 mmol/L — ABNORMAL LOW (ref 135–145)
Sodium: 138 mmol/L (ref 135–145)

## 2023-03-06 LAB — RAPID URINE DRUG SCREEN, HOSP PERFORMED
Amphetamines: NOT DETECTED
Barbiturates: NOT DETECTED
Benzodiazepines: POSITIVE — AB
Cocaine: NOT DETECTED
Opiates: NOT DETECTED
Tetrahydrocannabinol: POSITIVE — AB

## 2023-03-06 LAB — URIC ACID: Uric Acid, Serum: 5.9 mg/dL (ref 3.7–8.6)

## 2023-03-06 LAB — MAGNESIUM: Magnesium: 1.7 mg/dL (ref 1.7–2.4)

## 2023-03-06 LAB — BRAIN NATRIURETIC PEPTIDE: B Natriuretic Peptide: 98.8 pg/mL (ref 0.0–100.0)

## 2023-03-06 MED ORDER — POTASSIUM CHLORIDE 10 MEQ/100ML IV SOLN
10.0000 meq | INTRAVENOUS | Status: AC
  Administered 2023-03-06 (×6): 10 meq via INTRAVENOUS

## 2023-03-06 MED ORDER — NICOTINE 21 MG/24HR TD PT24
21.0000 mg | MEDICATED_PATCH | Freq: Every day | TRANSDERMAL | Status: DC
  Administered 2023-03-06 – 2023-03-12 (×7): 21 mg via TRANSDERMAL
  Filled 2023-03-06 (×7): qty 1

## 2023-03-06 MED ORDER — SODIUM CHLORIDE 0.9 % IV SOLN: INTRAVENOUS | Status: DC

## 2023-03-06 MED ORDER — METHYLPREDNISOLONE SODIUM SUCC 40 MG IJ SOLR
INTRAMUSCULAR | Status: AC
  Filled 2023-03-06: qty 1

## 2023-03-06 MED ORDER — PANTOPRAZOLE SODIUM 40 MG PO TBEC
DELAYED_RELEASE_TABLET | ORAL | Status: AC
  Filled 2023-03-06: qty 1

## 2023-03-06 MED ORDER — POTASSIUM CHLORIDE 10 MEQ/100ML IV SOLN
INTRAVENOUS | Status: AC
  Filled 2023-03-06: qty 100

## 2023-03-06 MED ORDER — POTASSIUM CHLORIDE CRYS ER 20 MEQ PO TBCR
40.0000 meq | EXTENDED_RELEASE_TABLET | Freq: Four times a day (QID) | ORAL | Status: DC
  Administered 2023-03-06: 40 meq via ORAL

## 2023-03-06 MED ORDER — TRAMADOL HCL 50 MG PO TABS
50.0000 mg | ORAL_TABLET | Freq: Four times a day (QID) | ORAL | Status: DC | PRN
  Administered 2023-03-06 – 2023-03-07 (×2): 50 mg via ORAL
  Filled 2023-03-06: qty 1

## 2023-03-06 MED ORDER — METHYLPREDNISOLONE SODIUM SUCC 40 MG IJ SOLR
40.0000 mg | Freq: Once | INTRAMUSCULAR | Status: AC
  Administered 2023-03-06: 40 mg via INTRAVENOUS

## 2023-03-06 MED ORDER — PANTOPRAZOLE SODIUM 40 MG PO TBEC
40.0000 mg | DELAYED_RELEASE_TABLET | Freq: Every day | ORAL | Status: DC
  Administered 2023-03-06 – 2023-03-12 (×7): 40 mg via ORAL
  Filled 2023-03-06 (×6): qty 1

## 2023-03-06 MED ORDER — POTASSIUM CHLORIDE CRYS ER 20 MEQ PO TBCR
EXTENDED_RELEASE_TABLET | ORAL | Status: AC
  Filled 2023-03-06: qty 2

## 2023-03-06 MED ORDER — COLCHICINE 0.3 MG HALF TABLET
0.3000 mg | ORAL_TABLET | Freq: Two times a day (BID) | ORAL | Status: DC
  Administered 2023-03-06 – 2023-03-12 (×12): 0.3 mg via ORAL
  Filled 2023-03-06 (×18): qty 1

## 2023-03-06 MED ORDER — MAGNESIUM SULFATE 2 GM/50ML IV SOLN
2.0000 g | Freq: Once | INTRAVENOUS | Status: AC
  Administered 2023-03-06: 2 g via INTRAVENOUS

## 2023-03-06 MED ORDER — POTASSIUM CHLORIDE CRYS ER 20 MEQ PO TBCR
40.0000 meq | EXTENDED_RELEASE_TABLET | Freq: Three times a day (TID) | ORAL | Status: AC
  Administered 2023-03-06 (×2): 40 meq via ORAL
  Filled 2023-03-06: qty 2

## 2023-03-06 MED ORDER — POTASSIUM CHLORIDE 2 MEQ/ML IV SOLN
INTRAVENOUS | Status: DC
  Filled 2023-03-06 (×2): qty 1000

## 2023-03-06 MED ORDER — TRAMADOL HCL 50 MG PO TABS
ORAL_TABLET | ORAL | Status: AC
  Filled 2023-03-06: qty 1

## 2023-03-06 MED ORDER — MAGNESIUM SULFATE 2 GM/50ML IV SOLN
INTRAVENOUS | Status: AC
  Filled 2023-03-06: qty 50

## 2023-03-06 NOTE — Plan of Care (Signed)

## 2023-03-06 NOTE — Consult Note (Cosign Needed Addendum)
[facility] Face-to-Face Psychiatry Consult   Reason for Consult: " History of PTSD, generalized anxiety disorder and seizure affective disorder holding Zoloft and Seroquel in the setting of hyponatremia request for reevaluation and alternate medication management." Referring Physician:  Janalyn Shy Subrina  Patient Identification: [name] MRN:  [id number] Principal Diagnosis: <principal problem not specified> Diagnosis:  Active Problems:   Hypokalemia   PTSD (post-traumatic stress disorder)   Alcohol abuse   Psychosis (HCC)   Essential hypertension   Hypomagnesemia   Alcoholic cirrhosis of liver with ascites (HCC)   Hypervolemic hyponatremia   Hypocalcemia   Hepatic cirrhosis (HCC)   History of seizure   Leukocytosis   Chronic anemia   Schizoaffective disorder (HCC)   Transaminitis   Elevated bilirubin   Hypochloremia   Total Time spent with patient: 15 minutes  Subjective:   [name] is a 52 y.o. male was seen and evaluated face-to-face by this provider.  Patient was recently consulted regarding medication management and possible side effects.  Patient reports current diagnoses with schizoaffective disorder, major depressive disorder and generalized anxiety disorder.  States he has been taking Seroquel and Zoloft for many years.  States he was followed by [facility] for medication management and therapy services.  He is denying suicidal or homicidal ideations.  Denies auditory visual hallucinations.  Does report a history of alcohol abuse/misuse.  Chart review medications held due to hyponatremia.  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   Plan: Continue to hold Zoloft due to hyponatremia risk/side effects. -May restart Seroquel 200 mg p.o. nightly.  -Patient to keep all outpatient follow-up appointments with psychiatry and therapy services  During evaluation [name] is resting in bed;  he is alert/oriented x 4; calm/cooperative; and mood congruent with affect.  Patient is speaking in a clear tone at moderate volume, and normal pace; with good eye contact. his thought process is coherent and relevant; There is no indication that he is currently responding to internal/external stimuli or experiencing delusional thought content.  Patient denies suicidal/self-harm/homicidal ideation, psychosis, and paranoia.  Patient has remained calm throughout assessment and has answered questions appropriately.   HPI: As noted per psychiatry assessment note on 03/05/2023 "[name] is a 52 y.o. male with medical history significant of alcohol abuse, alcoholic liver cirrhosis, anxiety, PTSD, psychosis, schizoaffective disorder, alcohol withdrawal seizure disorder, GERD and chronic anemia presented to emergency department as he was not feeling very well.  Patient reported he is drinking heavily for last 1 month.  Mostly 8 to 10 cans of beer and 1-2 bottles of wine. "  Past Psychiatric History:   Risk to Self:   Risk to Others:   Prior Inpatient Therapy:   Prior Outpatient Therapy:    Past Medical History:  Past Medical History:  Diagnosis Date   Alcohol abuse    Anxiety    at age 84   Blood transfusion without reported diagnosis    Cirrhosis (HCC)    Depression    at age 24   GERD (gastroesophageal reflux disease)    Hypertension    Nausea and vomiting    Psychosis (HCC)    PTSD (post-traumatic stress disorder)    Schizoaffective disorder    Seizure disorder (HCC)    related to etoh seizure   Symptomatic anemia     Past Surgical History:  Procedure Laterality Date   ESOPHAGOGASTRODUODENOSCOPY (EGD) WITH PROPOFOL N/A 05/05/2020   Procedure: ESOPHAGOGASTRODUODENOSCOPY (EGD)  WITH PROPOFOL;  Surgeon: Napoleon Form, MD;  Location: [facility] ENDOSCOPY;  Service: Endoscopy;  Laterality: N/A;   FOOT SURGERY     right   HAND SURGERY     Family History:  Family History  Problem  Relation Age of Onset   Alcoholism Mother    CAD Father    Depression Brother    Colon polyps Neg Hx    Esophageal cancer Neg Hx    Pancreatic cancer Neg Hx    Stomach cancer Neg Hx    Family Psychiatric  History:  Social History:  Social History   Substance and Sexual Activity  Alcohol Use Yes   Comment: "until I pass out"     Social History   Substance and Sexual Activity  Drug Use Yes   Frequency: 1.0 times per week   Types: Marijuana    Social History   Socioeconomic History   Marital status: Divorced    Spouse name: Not on file   Number of children: Not on file   Years of education: Not on file   Highest education level: Not on file  Occupational History   Not on file  Tobacco Use   Smoking status: Every Day    Current packs/day: 1.50    Average packs/day: 1.5 packs/day for 30.0 years (45.0 ttl pk-yrs)    Types: Cigarettes   Smokeless tobacco: Never  Vaping Use   Vaping status: Never Used  Substance and Sexual Activity   Alcohol use: Yes    Comment: "until I pass out"   Drug use: Yes    Frequency: 1.0 times per week    Types: Marijuana   Sexual activity: Yes    Birth control/protection: Condom  Other Topics Concern   Not on file  Social History Narrative   Not on file   Social Determinants of Health   Financial Resource Strain: Not on file  Food Insecurity: No Food Insecurity (10/18/2019)   Hunger Vital Sign    Worried About Running Out of Food in the Last Year: Never true    Ran Out of Food in the Last Year: Never true  Transportation Needs: Unmet Transportation Needs (01/13/2022)   PRAPARE - Administrator, Civil Service (Medical): Yes    Lack of Transportation (Non-Medical): Yes  Physical Activity: Not on file  Stress: Not on file  Social Connections: Not on file   Additional Social History:    Allergies:   Allergies  Allergen Reactions   Bupropion Other (See Comments)    Caused seizures Caused seizures Feel as though I  will have a seizure when taking this med    Labs:  Results for orders placed or performed during the hospital encounter of 03/04/23 (from the past 48 hour(s))  Ethanol     Status: Abnormal   Collection Time: 03/04/23 11:20 PM  Result Value Ref Range   Alcohol, Ethyl (B) 188 (H) <10 mg/dL    Comment: (NOTE) Lowest detectable limit for serum alcohol is 10 mg/dL.  For medical purposes only. Performed at [facility] Lab, [address], [place], [place] [address]   Salicylate level     Status: Abnormal   Collection Time: 03/04/23 11:20 PM  Result Value Ref Range   Salicylate Lvl <7.0 (L) 7.0 - 30.0 mg/dL    Comment: Performed at [facility] Lab, [address], [place], [place] [address]  Acetaminophen level     Status: Abnormal   Collection Time: 03/04/23 11:20 PM  Result Value Ref Range   Acetaminophen (Tylenol), Serum <10 (L) 10 - 30 ug/mL    Comment: (NOTE) Therapeutic concentrations vary significantly. A range of 10-30 ug/mL  may be an effective concentration for many patients. However, some  are best treated at concentrations outside of this range. Acetaminophen concentrations >150 ug/mL at 4 hours after ingestion  and >50 ug/mL at 12 hours after ingestion are often associated with  toxic reactions.  Performed at [facility] Lab, [address], [place], [place] [address]   Comprehensive metabolic panel     Status: Abnormal   Collection Time: 03/04/23 11:20 PM  Result Value Ref Range   Sodium 117 (LL) 135 - 145 mmol/L    Comment: CRITICAL RESULT CALLED TO, READ BACK BY AND VERIFIED WITH F. FERNIDAS RN 03/05/23 @0120  BY J. WHITE   Potassium <2.0 (LL) 3.5 - 5.1 mmol/L    Comment: CRITICAL RESULT CALLED TO, READ BACK BY AND VERIFIED WITH F. FERNIDAS RN 03/05/23 @0120  BY J. WHITE   Chloride <65 (LL) 98 - 111 mmol/L    Comment: CRITICAL RESULT CALLED TO, READ BACK BY AND VERIFIED WITH F. FERNIDAS RN 03/05/23 @0120  BY J. WHITE   CO2 29 22 - 32 mmol/L   Glucose, Bld 155  (H) 70 - 99 mg/dL    Comment: Glucose reference range applies only to samples taken after fasting for at least 8 hours.   BUN 13 6 - 20 mg/dL   Creatinine, Ser 8.46 0.61 - 1.24 mg/dL   Calcium 5.4 (LL) 8.9 - 10.3 mg/dL    Comment: CRITICAL RESULT CALLED TO, READ BACK BY AND VERIFIED WITH F. FERNIDAS RN 03/05/23 @0120  BY J. WHITE   Total Protein 6.3 (L) 6.5 - 8.1 g/dL   Albumin 3.0 (L) 3.5 - 5.0 g/dL   AST 962 (H) 15 - 41 U/L   ALT 91 (H) 0 - 44 U/L   Alkaline Phosphatase 133 (H) 38 - 126 U/L   Total Bilirubin 2.1 (H) 0.3 - 1.2 mg/dL   GFR, Estimated >95 >28 mL/min    Comment: (NOTE) Calculated using the CKD-EPI Creatinine Equation (2021)    Anion gap NOT CALCULATED 5 - 15    Comment: ELECTROLYTES REPEATED TO VERIFY Performed at [facility] Lab, [address], [place], [place] [address]   Ammonia     Status: Abnormal   Collection Time: 03/05/23 12:34 AM  Result Value Ref Range   Ammonia 43 (H) 9 - 35 umol/L    Comment: Performed at [facility] Lab, [address] [place], [place] [address]  Comprehensive metabolic panel     Status: Abnormal   Collection Time: 03/05/23 12:34 AM  Result Value Ref Range   Sodium 115 (LL) 135 - 145 mmol/L    Comment: CRITICAL RESULT CALLED TO, READ BACK BY AND VERIFIED WITH F. FERNIDAS RN 03/05/23 @0120  BY J. WHITE   Potassium <2.0 (LL) 3.5 - 5.1 mmol/L    Comment: CRITICAL RESULT CALLED TO, READ BACK BY AND VERIFIED WITH F. FERNIDAS RN 03/05/23 @0120  BY J. WHITE   Chloride <65 (LL) 98 - 111 mmol/L    Comment: CRITICAL RESULT CALLED TO, READ BACK BY AND VERIFIED WITH F. FERNIDAS RN 03/05/23 @0120  BY J. WHITE   CO2 31 22 - 32 mmol/L   Glucose, Bld 139 (H) 70 - 99 mg/dL    Comment: Glucose reference range applies only to samples taken after fasting for at least 8 hours.   BUN  13 6 - 20 mg/dL   Creatinine, Ser 4.09 0.61 - 1.24 mg/dL   Calcium 5.1 (LL) 8.9 - 10.3 mg/dL    Comment: CRITICAL RESULT CALLED TO, READ BACK BY AND VERIFIED WITH F.  FERNIDAS RN 03/05/23 @0120  BY J. WHITE   Total Protein 6.1 (L) 6.5 - 8.1 g/dL   Albumin 2.9 (L) 3.5 - 5.0 g/dL   AST 811 (H) 15 - 41 U/L   ALT 86 (H) 0 - 44 U/L   Alkaline Phosphatase 117 38 - 126 U/L   Total Bilirubin 2.2 (H) 0.3 - 1.2 mg/dL   GFR, Estimated >91 >47 mL/min    Comment: (NOTE) Calculated using the CKD-EPI Creatinine Equation (2021)    Anion gap NOT CALCULATED 5 - 15    Comment: ELECTROLYTES REPEATED TO VERIFY Performed at [facility] Lab, [address] [place], [place] [address]   Lipase, blood     Status: None   Collection Time: 03/05/23 12:34 AM  Result Value Ref Range   Lipase 36 11 - 51 U/L    Comment: Performed at [facility] Lab, [address], [place], [place] [address]  CBC     Status: Abnormal   Collection Time: 03/05/23 12:34 AM  Result Value Ref Range   WBC 13.1 (H) 4.0 - 10.5 K/uL   RBC 3.99 (L) 4.22 - 5.81 MIL/uL   Hemoglobin 13.8 13.0 - 17.0 g/dL   HCT 65.7 (L) 84.6 - 96.2 %   MCV 93.0 80.0 - 100.0 fL   MCH 34.6 (H) 26.0 - 34.0 pg   MCHC 37.2 (H) 30.0 - 36.0 g/dL   RDW 95.2 (H) 84.1 - 32.4 %   Platelets 318 150 - 400 K/uL   nRBC 0.6 (H) 0.0 - 0.2 %    Comment: Performed at [facility] Lab, [address] [place], [place] [address]  Protime-INR     Status: None   Collection Time: 03/05/23  1:22 AM  Result Value Ref Range   Prothrombin Time 14.2 11.4 - 15.2 seconds   INR 1.1 0.8 - 1.2    Comment: (NOTE) INR goal varies based on device and disease states. Performed at [facility] Lab, [address], [place], [place] [address]   Magnesium     Status: Abnormal   Collection Time: 03/05/23  1:22 AM  Result Value Ref Range   Magnesium 0.8 (LL) 1.7 - 2.4 mg/dL    Comment: CRITICAL RESULT CALLED TO, READ BACK BY AND VERIFIED WITH F. FREITAS RN 03/05/23 @0202  BY J. WHITE Performed at [facility] Lab, [address] [place], [place] [address]   Na and K (sodium & potassium), rand urine     Status: None   Collection Time:  03/05/23  3:15 AM  Result Value Ref Range   Sodium, Ur <10 mmol/L   Potassium Urine 24 mmol/L    Comment: Performed at [facility] Lab, [address], [place], [place] [address]  Osmolality, urine     Status: None   Collection Time: 03/05/23  3:15 AM  Result Value Ref Range   Osmolality, Ur 472 300 - 900 mOsm/kg    Comment: Performed at [facility] Lab, [address], [place], [place] [address]  Magnesium     Status: None   Collection Time: 03/05/23  3:45 AM  Result Value Ref Range   Magnesium 1.8 1.7 - 2.4 mg/dL    Comment: Performed at [facility] Lab, [address] [place], [place] [address]  Basic metabolic panel     Status: Abnormal   Collection Time: 03/05/23  3:45 AM  Result Value Ref Range   Sodium 119 (LL) 135 - 145 mmol/L    Comment: CRITICAL RESULT CALLED TO, READ BACK BY AND VERIFIED WITH F. FRIETAS RN 03/05/23 @0505  BY J. WHITE   Potassium <2.0 (LL) 3.5 - 5.1 mmol/L    Comment: CRITICAL RESULT CALLED TO, READ BACK BY AND VERIFIED WITH F. FRIETAS RN 03/05/23 @0505  BY J. WHITE   Chloride <65 (LL) 98 - 111 mmol/L    Comment: CRITICAL RESULT CALLED TO, READ BACK BY AND VERIFIED WITH F. FRIETAS RN 03/05/23 @0505  BY J. WHITE   CO2 32 22 - 32 mmol/L   Glucose, Bld 151 (H) 70 - 99 mg/dL    Comment: Glucose reference range applies only to samples taken after fasting for at least 8 hours.   BUN 13 6 - 20 mg/dL   Creatinine, Ser 4.78 0.61 - 1.24 mg/dL   Calcium 5.2 (LL) 8.9 - 10.3 mg/dL    Comment: CRITICAL RESULT CALLED TO, READ BACK BY AND VERIFIED WITH F. FRIETAS RN 03/05/23 @0505  BY J. WHITE   GFR, Estimated >60 >60 mL/min    Comment: (NOTE) Calculated using the CKD-EPI Creatinine Equation (2021)    Anion gap NOT CALCULATED 5 - 15    Comment: ELECTROLYTES REPEATED TO VERIFY Performed at [facility] Lab, [address], [place], [place] [address]   HIV Antibody (routine testing w rflx)     Status: None   Collection Time: 03/05/23  3:45 AM  Result Value Ref  Range   HIV Screen 4th Generation wRfx Non Reactive Non Reactive    Comment: Performed at [facility] Lab, [address] [place], [place] [address]  Phosphorus     Status: None   Collection Time: 03/05/23  3:45 AM  Result Value Ref Range   Phosphorus 3.6 2.5 - 4.6 mg/dL    Comment: Performed at [facility] Lab, [address] [place], [place] [address]  Hepatitis panel, acute     Status: None   Collection Time: 03/05/23  3:45 AM  Result Value Ref Range   Hepatitis B Surface Ag NON REACTIVE NON REACTIVE   HCV Ab NON REACTIVE NON REACTIVE    Comment: (NOTE) Nonreactive HCV antibody screen is consistent with no HCV infections,  unless recent infection is suspected or other evidence exists to indicate HCV infection.     Hep A IgM NON REACTIVE NON REACTIVE   Hep B C IgM NON REACTIVE NON REACTIVE    Comment: Performed at [facility], [address] [place], [place] [address]  APTT     Status: None   Collection Time: 03/05/23  3:45 AM  Result Value Ref Range   aPTT 33 24 - 36 seconds    Comment: Performed at [facility] Lab, [address], [place], [place] [address]  VITAMIN D 25 Hydroxy (Vit-D Deficiency, Fractures)     Status: Abnormal   Collection Time: 03/05/23  3:45 AM  Result Value Ref Range   Vit D, 25-Hydroxy 5.63 (L) 30 - 100 ng/mL    Comment: (NOTE) Vitamin D deficiency has been defined by the Institute of Medicine  and an Endocrine Society practice guideline as a level of serum 25-OH  vitamin D less than 20 ng/mL (1,2). The Endocrine Society went on to  further define vitamin D insufficiency  as a level between 21 and 29  ng/mL (2).  1. IOM (Institute of Medicine). 2010. Dietary reference intakes for  calcium and D. [place]: [employer]. 2. Holick MF, Binkley [place], Bischoff-Ferrari HA, et al. Evaluation,  treatment, and prevention of vitamin D deficiency: an Endocrine  Society clinical practice guideline, JCEM. 2011 Jul; 96(7):  1911-30.  Performed at [facility] Lab, [address] [place], [place] [address]   CBC     Status: Abnormal   Collection Time: 03/05/23  4:34 AM  Result Value Ref Range   WBC 13.2 (H) 4.0 - 10.5 K/uL   RBC 4.12 (L) 4.22 - 5.81 MIL/uL   Hemoglobin 14.0 13.0 - 17.0 g/dL   HCT 28.4 (L) 13.2 - 44.0 %   MCV 92.5 80.0 - 100.0 fL   MCH 34.0 26.0 - 34.0 pg   MCHC 36.7 (H) 30.0 - 36.0 g/dL   RDW 10.2 (H) 72.5 - 36.6 %   Platelets 315 150 - 400 K/uL   nRBC 0.6 (H) 0.0 - 0.2 %    Comment: Performed at [facility] Lab, [address] [place], [place] [address]  Basic metabolic panel     Status: Abnormal   Collection Time: 03/05/23  4:48 AM  Result Value Ref Range   Sodium 121 (L) 135 - 145 mmol/L   Potassium <2.0 (LL) 3.5 - 5.1 mmol/L    Comment: CRITICAL RESULT CALLED TO, READ BACK BY AND VERIFIED WITH F. FRIETAS RN 03/05/23 @0636  BY J. WHITE   Chloride <65 (LL) 98 - 111 mmol/L    Comment: CRITICAL RESULT CALLED TO, READ BACK BY AND VERIFIED WITH F. FRIETAS RN 03/05/23 @0636  BY J. WHITE   CO2 31 22 - 32 mmol/L   Glucose, Bld 137 (H) 70 - 99 mg/dL    Comment: Glucose reference range applies only to samples taken after fasting for at least 8 hours.   BUN 14 6 - 20 mg/dL   Creatinine, Ser 7.42 0.61 - 1.24 mg/dL   Calcium 6.4 (LL) 8.9 - 10.3 mg/dL    Comment: CRITICAL RESULT CALLED TO, READ BACK BY AND VERIFIED WITH F. FRIETAS RN 03/05/23 @0636  BY J. WHITE   GFR, Estimated >60 >60 mL/min    Comment: (NOTE) Calculated using the CKD-EPI Creatinine Equation (2021)    Anion gap NOT CALCULATED 5 - 15    Comment: ELECTROLYTES REPEATED TO VERIFY Performed at [facility] Lab, [address] [place], [place] [address]   Osmolality     Status: None   Collection Time: 03/05/23  4:48 AM  Result Value Ref Range   Osmolality 277 275 - 295 mOsm/kg    Comment: Performed at [facility] Lab, [address], [place], [place] [address]  Calcium     Status: Abnormal   Collection Time:  03/05/23  4:48 AM  Result Value Ref Range   Calcium 6.3 (LL) 8.9 - 10.3 mg/dL    Comment: CRITICAL RESULT CALLED TO, READ BACK BY AND VERIFIED WITH F. FRIETAS RN 03/05/23 @0622  BY J. WHITE Performed at [facility] Lab, [address] [place], [place] [address]   Hepatic function panel     Status: Abnormal   Collection Time: 03/05/23  4:48 AM  Result Value Ref Range   Total Protein 6.9 6.5 - 8.1 g/dL   Albumin 3.3 (L) 3.5 - 5.0 g/dL   AST 188 (H) 15 - 41 U/L   ALT 93 (H) 0 -  44 U/L   Alkaline Phosphatase 136 (H) 38 - 126 U/L   Total Bilirubin 2.3 (H) 0.3 - 1.2 mg/dL   Bilirubin, Direct 1.1 (H) 0.0 - 0.2 mg/dL   Indirect Bilirubin 1.2 (H) 0.3 - 0.9 mg/dL    Comment: Performed at [facility] Lab, [address] [place], [place] [address]  Calcium     Status: Abnormal   Collection Time: 03/05/23 10:05 AM  Result Value Ref Range   Calcium 6.7 (L) 8.9 - 10.3 mg/dL    Comment: Performed at [facility], [address] [place], [place] [address]  Basic metabolic panel     Status: Abnormal   Collection Time: 03/05/23 12:46 PM  Result Value Ref Range   Sodium 119 (LL) 135 - 145 mmol/L    Comment: CRITICAL RESULT CALLED TO, READ BACK BY AND VERIFIED WITH M. ROBERTS, RN AT 1337 07.18.24 D. BLU   Potassium 2.2 (LL) 3.5 - 5.1 mmol/L    Comment: CRITICAL RESULT CALLED TO, READ BACK BY AND VERIFIED WITH M. ROBERTS, RN AT 1337 07.18.24 D. BLU   Chloride 69 (L) 98 - 111 mmol/L   CO2 36 (H) 22 - 32 mmol/L   Glucose, Bld 126 (H) 70 - 99 mg/dL    Comment: Glucose reference range applies only to samples taken after fasting for at least 8 hours.   BUN 12 6 - 20 mg/dL   Creatinine, Ser 4.69 0.61 - 1.24 mg/dL   Calcium 9.1 8.9 - 62.9 mg/dL    Comment: DELTA CHECK NOTED   GFR, Estimated >60 >60 mL/min    Comment: (NOTE) Calculated using the CKD-EPI Creatinine Equation (2021)    Anion gap 14 5 - 15    Comment: Performed at [facility] Lab, [address] [place], [place] [address]   Renal function panel     Status: Abnormal   Collection Time: 03/05/23  2:00 PM  Result Value Ref Range   Sodium 122 (L) 135 - 145 mmol/L   Potassium 2.1 (LL) 3.5 - 5.1 mmol/L    Comment: CRITICAL RESULT CALLED TO, READ BACK BY AND VERIFIED WITH L CARSON,RN 1557 03/05/2023 WBOND   Chloride 69 (L) 98 - 111 mmol/L   CO2 38 (H) 22 - 32 mmol/L   Glucose, Bld 139 (H) 70 - 99 mg/dL    Comment: Glucose reference range applies only to samples taken after fasting for at least 8 hours.   BUN 12 6 - 20 mg/dL   Creatinine, Ser 3.24 0.61 - 1.24 mg/dL   Calcium 7.2 (L) 8.9 - 10.3 mg/dL   Phosphorus 4.1 2.5 - 4.6 mg/dL   Albumin 2.6 (L) 3.5 - 5.0 g/dL   GFR, Estimated >40 >10 mL/min    Comment: (NOTE) Calculated using the CKD-EPI Creatinine Equation (2021)    Anion gap 15 5 - 15    Comment: Performed at [facility] Lab, [address] [place], [place] [address]  Renal function panel     Status: Abnormal   Collection Time: 03/05/23  6:32 PM  Result Value Ref Range   Sodium 123 (L) 135 - 145 mmol/L   Potassium 2.0 (LL) 3.5 - 5.1 mmol/L    Comment: CRITICAL RESULT CALLED TO, READ BACK BY AND VERIFIED WITH C. MILLS, RN AT 1947 07.18.24 D. BLU   Chloride 73 (L) 98 - 111 mmol/L   CO2 36 (H) 22 - 32 mmol/L   Glucose, Bld 122 (H) 70 - 99 mg/dL  Comment: Glucose reference range applies only to samples taken after fasting for at least 8 hours.   BUN 12 6 - 20 mg/dL   Creatinine, Ser 1.61 0.61 - 1.24 mg/dL   Calcium 7.0 (L) 8.9 - 10.3 mg/dL   Phosphorus 4.0 2.5 - 4.6 mg/dL   Albumin 2.6 (L) 3.5 - 5.0 g/dL   GFR, Estimated >09 >60 mL/min    Comment: (NOTE) Calculated using the CKD-EPI Creatinine Equation (2021)    Anion gap 14 5 - 15    Comment: Performed at [facility] Lab, [address] [place], [place] [address]  Renal function panel     Status: Abnormal   Collection Time: 03/05/23  8:55 PM  Result Value Ref Range   Sodium 124 (L) 135 - 145 mmol/L   Potassium 2.1 (LL) 3.5 - 5.1  mmol/L    Comment: CRITICAL RESULT CALLED TO, READ BACK BY AND VERIFIED WITH C. MILLS RN 03/05/23 @2206  BY J. WHITE   Chloride 74 (L) 98 - 111 mmol/L   CO2 38 (H) 22 - 32 mmol/L   Glucose, Bld 117 (H) 70 - 99 mg/dL    Comment: Glucose reference range applies only to samples taken after fasting for at least 8 hours.   BUN 11 6 - 20 mg/dL   Creatinine, Ser 8.11 0.61 - 1.24 mg/dL   Calcium 7.0 (L) 8.9 - 10.3 mg/dL   Phosphorus 4.1 2.5 - 4.6 mg/dL   Albumin 2.6 (L) 3.5 - 5.0 g/dL   GFR, Estimated >91 >47 mL/min    Comment: (NOTE) Calculated using the CKD-EPI Creatinine Equation (2021)    Anion gap 12 5 - 15    Comment: Performed at [facility] Lab, [address] [place], [place] [address]  Magnesium     Status: None   Collection Time: 03/06/23  5:35 AM  Result Value Ref Range   Magnesium 1.7 1.7 - 2.4 mg/dL    Comment: Performed at [facility] Lab, [address], [place], [place] [address]  CBC     Status: Abnormal   Collection Time: 03/06/23  5:35 AM  Result Value Ref Range   WBC 6.0 4.0 - 10.5 K/uL   RBC 3.30 (L) 4.22 - 5.81 MIL/uL   Hemoglobin 11.5 (L) 13.0 - 17.0 g/dL   HCT 65.7 (L) 84.6 - 96.2 %   MCV 96.1 80.0 - 100.0 fL   MCH 34.8 (H) 26.0 - 34.0 pg   MCHC 36.3 (H) 30.0 - 36.0 g/dL   RDW 95.2 (H) 84.1 - 32.4 %   Platelets 186 150 - 400 K/uL   nRBC 0.8 (H) 0.0 - 0.2 %    Comment: Performed at [facility] Lab, [address], [place], [place] [address]  Renal function panel     Status: Abnormal   Collection Time: 03/06/23  6:30 AM  Result Value Ref Range   Sodium 128 (L) 135 - 145 mmol/L   Potassium 2.3 (LL) 3.5 - 5.1 mmol/L    Comment: CRITICAL RESULT CALLED TO, READ BACK BY AND VERIFIED WITH: D. GRIFFITH,RN AT 7253 03/06/2023 BY ZBEEC.    Chloride 82 (L) 98 - 111 mmol/L   CO2 37 (H) 22 - 32 mmol/L   Glucose, Bld 110 (H) 70 - 99 mg/dL    Comment: Glucose reference range applies only to samples taken after fasting for at least 8 hours.   BUN 11 6 - 20 mg/dL    Creatinine, Ser 6.64 0.61 -  1.24 mg/dL   Calcium 6.9 (L) 8.9 - 10.3 mg/dL   Phosphorus 3.2 2.5 - 4.6 mg/dL   Albumin 2.4 (L) 3.5 - 5.0 g/dL   GFR, Estimated >78 >29 mL/min    Comment: (NOTE) Calculated using the CKD-EPI Creatinine Equation (2021)    Anion gap 9 5 - 15    Comment: Performed at [facility], [address], [place], [place] [address]  Brain natriuretic peptide     Status: None   Collection Time: 03/06/23  7:30 AM  Result Value Ref Range   B Natriuretic Peptide 98.8 0.0 - 100.0 pg/mL    Comment: Performed at [facility], [address], [place], [place] [address]  Renal function panel     Status: Abnormal   Collection Time: 03/06/23  9:08 AM  Result Value Ref Range   Sodium 138 135 - 145 mmol/L   Potassium 3.6 3.5 - 5.1 mmol/L   Chloride 98 98 - 111 mmol/L   CO2 30 22 - 32 mmol/L   Glucose, Bld 80 70 - 99 mg/dL    Comment: Glucose reference range applies only to samples taken after fasting for at least 8 hours.   BUN <5 (L) 6 - 20 mg/dL   Creatinine, Ser 8.46 0.61 - 1.24 mg/dL   Calcium 7.9 (L) 8.9 - 10.3 mg/dL   Phosphorus 2.2 (L) 2.5 - 4.6 mg/dL   Albumin 2.0 (L) 3.5 - 5.0 g/dL   GFR, Estimated >96 >29 mL/min    Comment: (NOTE) Calculated using the CKD-EPI Creatinine Equation (2021)    Anion gap 10 5 - 15    Comment: Performed at [facility], [address] [place], [place] [address]  Rapid urine drug screen (hospital performed)     Status: Abnormal   Collection Time: 03/06/23  9:39 AM  Result Value Ref Range   Opiates NONE DETECTED NONE DETECTED   Cocaine NONE DETECTED NONE DETECTED   Benzodiazepines POSITIVE (A) NONE DETECTED   Amphetamines NONE DETECTED NONE DETECTED   Tetrahydrocannabinol POSITIVE (A) NONE DETECTED   Barbiturates NONE DETECTED NONE DETECTED    Comment: (NOTE) DRUG SCREEN FOR MEDICAL PURPOSES ONLY.  IF CONFIRMATION IS NEEDED FOR ANY PURPOSE, NOTIFY LAB WITHIN 5 DAYS.  LOWEST DETECTABLE LIMITS FOR URINE DRUG  SCREEN Drug Class                     Cutoff (ng/mL) Amphetamine and metabolites    1000 Barbiturate and metabolites    200 Benzodiazepine                 200 Opiates and metabolites        300 Cocaine and metabolites        300 THC                            50 Performed at [facility], [address] [place], [place] [address]   Basic metabolic panel     Status: Abnormal   Collection Time: 03/06/23  2:55 PM  Result Value Ref Range   Sodium 130 (L) 135 - 145 mmol/L   Potassium 2.9 (L) 3.5 - 5.1 mmol/L   Chloride 77 (L) 98 - 111 mmol/L   CO2 37 (H) 22 - 32 mmol/L   Glucose, Bld 160 (H) 70 - 99 mg/dL    Comment: Glucose reference range applies only to samples taken after  fasting for at least 8 hours.   BUN 12 6 - 20 mg/dL   Creatinine, Ser 4.09 0.61 - 1.24 mg/dL   Calcium 7.8 (L) 8.9 - 10.3 mg/dL   GFR, Estimated >81 >19 mL/min    Comment: (NOTE) Calculated using the CKD-EPI Creatinine Equation (2021)    Anion gap 16 (H) 5 - 15    Comment: Performed at [facility], [address] [place], [place] [address]  Uric acid     Status: None   Collection Time: 03/06/23  2:55 PM  Result Value Ref Range   Uric Acid, Serum 5.9 3.7 - 8.6 mg/dL    Comment: HEMOLYSIS AT THIS LEVEL MAY AFFECT RESULT Performed at [facility], [address] [place], [place] [address]     Current Facility-Administered Medications  Medication Dose Route Frequency Provider Last Rate Last Admin   acetaminophen (TYLENOL) tablet 650 mg  650 mg Oral Q6H PRN Janalyn Shy, Subrina, MD       allopurinol (ZYLOPRIM) tablet 100 mg  100 mg Oral Daily Sundil, Subrina, MD   100 mg at 03/06/23 0849   chlordiazePOXIDE (LIBRIUM) capsule 25 mg  25 mg Oral Q6H PRN Janalyn Shy, Subrina, MD       chlordiazePOXIDE (LIBRIUM) capsule 25 mg  25 mg Oral BH-qamhs Sundil, Subrina, MD       Followed by   Melene Muller ON 03/07/2023] chlordiazePOXIDE (LIBRIUM) capsule 25 mg  25 mg Oral Daily Sundil, Subrina, MD       colchicine  tablet 0.3 mg  0.3 mg Oral BID Leroy Sea, MD   0.3 mg at 03/06/23 1710   enoxaparin (LOVENOX) injection 40 mg  40 mg Subcutaneous Daily Sundil, Subrina, MD   40 mg at 03/06/23 1310   folic acid (FOLVITE) tablet 1 mg  1 mg Oral Daily Sundil, Subrina, MD   1 mg at 03/06/23 0850   gabapentin (NEURONTIN) capsule 300 mg  300 mg Oral TID Tyrone Nine, MD   300 mg at 03/06/23 1511   hydrOXYzine (ATARAX) tablet 25 mg  25 mg Oral Q6H PRN Sundil, Subrina, MD       lactated ringers 1,000 mL with potassium chloride 60 mEq infusion   Intravenous Continuous Leroy Sea, MD 75 mL/hr at 03/06/23 1717 New Bag at 03/06/23 1717   lactulose (CHRONULAC) 10 GM/15ML solution 30 g  30 g Oral BID Tyrone Nine, MD   30 g at 03/06/23 0850   levETIRAcetam (KEPPRA) IVPB 500 mg/100 mL premix  500 mg Intravenous BID Janalyn Shy, Subrina, MD 400 mL/hr at 03/06/23 1716 500 mg at 03/06/23 1716   loperamide (IMODIUM) capsule 2-4 mg  2-4 mg Oral PRN Janalyn Shy, Subrina, MD       LORazepam (ATIVAN) tablet 1-4 mg  1-4 mg Oral Q1H PRN Janalyn Shy, Subrina, MD   1 mg at 03/05/23 3086   Or   LORazepam (ATIVAN) injection 1-4 mg  1-4 mg Intravenous Q1H PRN Janalyn Shy, Subrina, MD       magnesium sulfate 2 GM/50ML IVPB            methylPREDNISolone sodium succinate (SOLU-MEDROL) 40 mg/mL injection            metoprolol succinate (TOPROL-XL) 24 hr tablet 25 mg  25 mg Oral Daily Tyrone Nine, MD   25 mg at 03/06/23 0850   multivitamin with minerals tablet 1 tablet  1 tablet Oral Daily Tereasa Coop, MD   1 tablet at 03/06/23 [phone]  ondansetron (ZOFRAN) injection 4 mg  4 mg Intravenous Q6H PRN Janalyn Shy, Subrina, MD   4 mg at 03/05/23 0631   pantoprazole (PROTONIX) 40 MG EC tablet            pantoprazole (PROTONIX) EC tablet 40 mg  40 mg Oral Daily Leroy Sea, MD   40 mg at 03/06/23 0948   potassium chloride 10 MEQ/100ML IVPB            potassium chloride 10 MEQ/100ML IVPB            potassium chloride 10 MEQ/100ML IVPB             potassium chloride 10 MEQ/100ML IVPB            potassium chloride 10 MEQ/100ML IVPB            potassium chloride 10 MEQ/100ML IVPB            potassium chloride SA (KLOR-CON M) 20 MEQ CR tablet            potassium chloride SA (KLOR-CON M) 20 MEQ CR tablet            potassium chloride SA (KLOR-CON M) CR tablet 40 mEq  40 mEq Oral TID Leroy Sea, MD   40 mEq at 03/06/23 1513   QUEtiapine (SEROQUEL) tablet 200 mg  200 mg Oral QHS Hazeline Junker B, MD   200 mg at 03/05/23 2136   thiamine (VITAMIN B1) tablet 100 mg  100 mg Oral Daily Sundil, Subrina, MD   100 mg at 03/06/23 2130   Or   thiamine (VITAMIN B1) injection 100 mg  100 mg Intravenous Daily Sundil, Subrina, MD       traMADol (ULTRAM) 50 MG tablet            traMADol (ULTRAM) tablet 50 mg  50 mg Oral Q6H PRN Leroy Sea, MD   50 mg at 03/06/23 0907    Musculoskeletal: Patient observed resting in bed unable to assess   Psychiatric Specialty Exam:  Presentation  General Appearance:  Appropriate for Environment  Eye Contact: Fair  Speech: Clear and Coherent; Slow  Speech Volume: Decreased  Handedness:No data recorded  Mood and Affect  Mood: Anxious  Affect: Congruent   Thought Process  Thought Processes: Coherent; Goal Directed  Descriptions of Associations:Intact  Orientation:Full (Time, Place and Person)  Thought Content:Abstract Reasoning; Logical  History of Schizophrenia/Schizoaffective disorder: yes Duration of Psychotic Symptoms: denied  Hallucinations:Hallucinations: None  Ideas of Reference:None  Suicidal Thoughts:Suicidal Thoughts: No  Homicidal Thoughts:Homicidal Thoughts: No   Sensorium  Memory: Recent Fair  Judgment: Fair  Insight: Fair   Art therapist  Concentration: Fair  Attention Span: Fair  Recall:No data recorded Fund of Knowledge:No data recorded Language: Fair   Psychomotor Activity  Psychomotor Activity: Psychomotor Activity:  Decreased   Assets  Assets: Manufacturing systems engineer; Housing; Desire for Improvement   Sleep  Sleep: Sleep: Poor   Physical Exam: Physical Exam Vitals and nursing note reviewed.  Constitutional:      Appearance: Normal appearance.  Cardiovascular:     Rate and Rhythm: Tachycardia present.  Neurological:     Mental Status: He is alert.  Psychiatric:        Mood and Affect: Mood normal.        Behavior: Behavior normal.        Thought Content: Thought content normal.    Review of Systems  Psychiatric/Behavioral:  Positive for depression and substance abuse.  Negative for suicidal ideas. The patient is nervous/anxious.   All other systems reviewed and are negative.  Blood pressure 123/80, pulse (!) 103, temperature 98 F (36.7 C), resp. rate (!) 24, height 6' (1.829 m), weight 99.8 kg, SpO2 99%. Body mass index is 29.84 kg/m.  Treatment Plan Summary: Daily contact with patient to assess and evaluate symptoms and progress in treatment and Medication management  Plan: Continue to hold Zoloft  25mg  daily due to hyponatremia risk/side effects. -May restart Seroquel 200 mg p.o. nightly -Continue Ativan /Librium taper/ CIWA protocols  -CSW to provide additional outpatient resources for alcohol and drug services/residential treatment -Patient to keep all outpatient follow-up appointments with psychiatry and therapy services  Psychiatry services signing off  Disposition: No evidence of imminent risk to self or others at present.   Patient does not meet criteria for psychiatric inpatient admission. Supportive therapy provided about ongoing stressors. Refer to IOP. Discussed crisis plan, support from social network, calling 911, coming to the Emergency Department, and calling Suicide Hotline.  Oneta Rack, NP 03/06/2023 6:36 PM

## 2023-03-06 NOTE — Evaluation (Signed)
Occupational Therapy Evaluation Patient Details Name: [name] MRN: [id number] DOB: 03-30-1971 Today's Date: 03/06/2023   History of Present Illness Pt is a 52 y/o male admitted for hyponatremia, hypokalemia and other electrolyte derangements in setting of etoh abuse. PMH: seizures, gout, neuropathy, schizoaffective disorder   Clinical Impression   PTA, pt living with mother who he assists with IADLs as needed. Pt typically completely independent in all daily tasks. Pt presents now with deficits in L ankle/foot pain (d/t likely gout flare) with inability to stand and bear weight. Pt also reportedly feeling much weaker than normal. Overall, pt requires Setup for UB ADL and up to Mod A for LB ADLs due to deficits. Encouraged EOB and improving tolerance to rest L foot on floor in prep for standing tasks. Provided UE HEP with theraband to further address strength during admission. With current presentation, pt likely would benefit from < 3 hours of therapy per day at [facility].      Recommendations for follow up therapy are one component of a multi-disciplinary discharge planning process, led by the attending physician.  Recommendations may be updated based on patient status, additional functional criteria and insurance authorization.   Assistance Recommended at Discharge Intermittent Supervision/Assistance  Patient can return home with the following A lot of help with walking and/or transfers;A lot of help with bathing/dressing/bathroom    Functional Status Assessment  Patient has had a recent decline in their functional status and demonstrates the ability to make significant improvements in function in a reasonable and predictable amount of time.  Equipment Recommendations  Other (comment) (TBD pending progress)    Recommendations for Other Services       Precautions / Restrictions Precautions Precautions: Fall;Other (comment) Precaution Comments: L foot/ankle pain Restrictions Weight  Bearing Restrictions: No Other Position/Activity Restrictions: though unable to WB through RLE due to pain      Mobility Bed Mobility Overal bed mobility: Modified Independent                  Transfers                   General transfer comment: unable due to L LE pain      Balance Overall balance assessment: Needs assistance Sitting-balance support: No upper extremity supported, Feet supported Sitting balance-Leahy Scale: Good                                     ADL either performed or assessed with clinical judgement   ADL Overall ADL's : Needs assistance/impaired Eating/Feeding: Independent   Grooming: Set up;Sitting   Upper Body Bathing: Set up;Sitting   Lower Body Bathing: Moderate assistance;Sitting/lateral leans;Bed level   Upper Body Dressing : Set up;Sitting   Lower Body Dressing: Moderate assistance;Sitting/lateral leans;Bed level Lower Body Dressing Details (indicate cue type and reason): able to long sit in bed to don fresh socks     Toileting- Clothing Manipulation and Hygiene: Moderate assistance;Sitting/lateral lean;Bed level         General ADL Comments: Limited primarily by L foot/ankle pain (from likely gout flare after discussion with nursing). Educated on gentle WB sitting EOB in prep for standing when pain tolerable as well as compensatory strategies using RW. Provided UE HEP with red t band for pt to trial to maximize overall strength.     Vision Baseline Vision/History: 1 Wears glasses Ability to See in Adequate Light: 0 Adequate  Patient Visual Report: No change from baseline Vision Assessment?: No apparent visual deficits     Perception     Praxis      Pertinent Vitals/Pain Pain Assessment Pain Assessment: Faces Faces Pain Scale: Hurts even more Pain Location: L foot/ankle Pain Descriptors / Indicators: Guarding, Grimacing, Shooting Pain Intervention(s): Monitored during session, Limited activity  within patient's tolerance     Hand Dominance Right   Extremity/Trunk Assessment Upper Extremity Assessment Upper Extremity Assessment: Generalized weakness   Lower Extremity Assessment Lower Extremity Assessment: Defer to PT evaluation   Cervical / Trunk Assessment Cervical / Trunk Assessment: Normal   Communication Communication Communication: No difficulties   Cognition Arousal/Alertness: Awake/alert Behavior During Therapy: WFL for tasks assessed/performed, Flat affect Overall Cognitive Status: No family/caregiver present to determine baseline cognitive functioning                                 General Comments: pleasant, follows one step commands though some decreased insight into deficits; pt talking about hospice during session, appears with depressed affect     General Comments  Pt reporting disinterest in 12 step program at [facility] as it did not really help him, inquiring about hospice d/t cirrhosis diagnosis but also interested in physical rehab at [place] (says he was at a SNF with prior hospitalization and was ok with that)    Exercises     Shoulder Instructions      Home Living Family/patient expects to be discharged to:: Private residence Living Arrangements: Parent Available Help at Discharge: Family;Available PRN/intermittently Type of Home: House Home Access: Stairs to enter [employer] of Steps: 2   Home Layout: One level     Bathroom Shower/Tub: Chief Strategy Officer: Standard     Home Equipment: Agricultural consultant (2 wheels);Shower seat   Additional Comments: pt unsure where he will DC; reports mother lives with him in his sister's home and he provides assistance for IADLs for mother      Prior Functioning/Environment Prior Level of Function : Independent/Modified Independent                        OT Problem List: Decreased strength;Decreased activity tolerance;Impaired balance (sitting and/or  standing);Pain;Decreased knowledge of use of DME or AE      OT Treatment/Interventions: Self-care/ADL training;Therapeutic exercise;Energy conservation;DME and/or AE instruction;Therapeutic activities;Patient/family education;Balance training    OT Goals(Current goals can be found in the care plan section) Acute Rehab OT Goals Patient Stated Goal: L ankle/foot pain to resolve, feel better, have a place to go at [facility] OT Goal Formulation: With patient Time For Goal Achievement: 03/20/23 Potential to Achieve Goals: Good ADL Goals Pt Will Perform Lower Body Bathing: with min guard assist;sit to/from stand;sitting/lateral leans Pt Will Perform Lower Body Dressing: with min guard assist;sit to/from stand;sitting/lateral leans Pt Will Transfer to Toilet: with min guard assist;stand pivot transfer;bedside commode Pt/caregiver will Perform Home Exercise Program: Increased strength;Both right and left upper extremity;With theraband;Independently;With written HEP provided  OT Frequency: Min 2X/week    Co-evaluation              AM-PAC OT "6 Clicks" Daily Activity     Outcome Measure Help from another person eating meals?: None Help from another person taking care of personal grooming?: A Little Help from another person toileting, which includes using toliet, bedpan, or urinal?: A Lot Help from another person bathing (including  washing, rinsing, drying)?: A Lot Help from another person to put on and taking off regular upper body clothing?: A Little Help from another person to put on and taking off regular lower body clothing?: A Lot 6 Click Score: 16   End of Session Equipment Utilized During Treatment: Oxygen Nurse Communication: Mobility status  Activity Tolerance: Patient tolerated treatment well Patient left: in bed;with call bell/phone within reach  OT Visit Diagnosis: Unsteadiness on feet (R26.81);Other abnormalities of gait and mobility (R26.89)                Time: 9562-1308 OT  Time Calculation (min): 31 min Charges:  OT General Charges $OT Visit: 1 Visit OT Evaluation $OT Eval Moderate Complexity: 1 Mod OT Treatments $Therapeutic Activity: 8-22 mins  Bradd Canary, OTR/L Acute Rehab Services Office: [phone]   Lorre Munroe 03/06/2023, 2:41 PM

## 2023-03-06 NOTE — Progress Notes (Signed)
PROGRESS NOTE                                                                                                                                                                                                             Patient Demographics:    [name], is a 52 y.o. male, DOB - 10-10-1970, WUJ:[id number]  Outpatient Primary MD for the patient is Georganna Skeans, MD    LOS - 1  Admit date - 03/04/2023    Chief Complaint  Patient presents with   Alcohol Intoxication       Brief Narrative (HPI from H&P)   52 y.o. male with a history of alcohol abuse, withdrawal-related seizure, alcoholic cirrhosis, schizoaffective disorder, GERD who presented to the ED on 03/04/2023 by EMS due to generally feeling unwell, weakness, poor oral intake, and abdominal distention/discomfort. He was found to have many severe electrolyte derangements, early alcohol withdrawal symptoms, he was found to be profoundly hyponatremic, hypokalemic as well.    Subjective:    [name] today has, No headache, No chest pain, No abdominal pain - No Nausea, No new weakness tingling or numbness, no SOB.   Assessment  & Plan :   Dehydration - Hyponatremia (relative access of free water) Hypokalemia, hypochloremia , Hypocalcemia -  Renal following, gentle NS, DW Dr Marisue Humble 03/06/23, electrolytes replaced, restrict free water, monitor.  Alcohol abuse - DTs - counseled, CIWA + librium.  History of seizure in setting of alcohol withdrawal: Maintained on chronic keppra.   Schizoaffective disorder:  Hold SSRI in setting of severe hyponatremia currently,  Continue seroquel at bedtime, Psych input.  Fall at home: R knee XR negative.  PT evaluation.  Fall precautions.  Gout: Chronic - Continue allopurinol.  Peripheral neuropathy: Continue gabapentin to avoid withdrawal.  HTN: Continue home metoprolol.        Condition - Extremely Guarded  Family  Communication  :  None  Code Status :  Full  Consults  :  Renal, Psych  PUD Prophylaxis : PPI   Procedures  :     CT head - stable  Abd [place] - no ascites      Disposition Plan  :    Status is: Inpatient   DVT Prophylaxis  :    enoxaparin (LOVENOX) injection 40 mg Start: 03/05/23 0600 SCDs Start: 03/05/23 9562  Lab Results  Component Value Date   PLT 186 03/06/2023    Diet :  Diet Order             Diet Heart Room service appropriate? Yes; Fluid consistency: Thin; Fluid restriction: 1200 mL Fluid  Diet effective now                    Inpatient Medications  Scheduled Meds:  allopurinol  100 mg Oral Daily   chlordiazePOXIDE  25 mg Oral TID   Followed by   chlordiazePOXIDE  25 mg Oral BH-qamhs   Followed by   Melene Muller ON 03/07/2023] chlordiazePOXIDE  25 mg Oral Daily   enoxaparin (LOVENOX) injection  40 mg Subcutaneous Daily   folic acid  1 mg Oral Daily   gabapentin  300 mg Oral TID   lactulose  30 g Oral BID   metoprolol succinate  25 mg Oral Daily   multivitamin with minerals  1 tablet Oral Daily   potassium chloride  40 mEq Oral TID   QUEtiapine  200 mg Oral QHS   thiamine  100 mg Oral Daily   Or   thiamine  100 mg Intravenous Daily   Continuous Infusions:  sodium chloride     levETIRAcetam 500 mg (03/06/23 0852)   magnesium sulfate bolus IVPB 2 g (03/06/23 0933)   potassium chloride 10 mEq (03/06/23 0931)   PRN Meds:.acetaminophen, chlordiazePOXIDE, hydrOXYzine, loperamide, LORazepam **OR** LORazepam, ondansetron (ZOFRAN) IV, traMADol  Antibiotics  :    Anti-infectives (From admission, onward)    None         Objective:   Vitals:   03/05/23 1959 03/05/23 2337 03/06/23 0420 03/06/23 0800  BP: 107/67 106/60 104/62 109/62  Pulse: 84 90 96 93  Resp: (!) 23 (!) 22 (!) 22 20  Temp: 98.1 F (36.7 C) 98.7 F (37.1 C) 98.7 F (37.1 C) 98.6 F (37 C)  TempSrc: Oral Axillary Oral   SpO2: 96% 94% 91% 96%  Weight:      Height:         Wt Readings from Last 3 Encounters:  03/05/23 99.8 kg  09/15/22 106.1 kg  07/30/22 107.4 kg     Intake/Output Summary (Last 24 hours) at 03/06/2023 0939 Last data filed at 03/06/2023 0421 Gross per 24 hour  Intake 100 ml  Output 1000 ml  Net -900 ml     Physical Exam  Awake Alert, No new F.N deficits, mild Dts [place].AT,PERRAL Supple Neck, No JVD,   Symmetrical Chest wall movement, Good air movement bilaterally, CTAB RRR,No Gallops,Rubs or new Murmurs,  +ve B.Sounds, Abd Soft, No tenderness,   No Cyanosis, Clubbing or edema      Data Review:    Recent Labs  Lab 03/05/23 0034 03/05/23 0434 03/06/23 0535  WBC 13.1* 13.2* 6.0  HGB 13.8 14.0 11.5*  HCT 37.1* 38.1* 31.7*  PLT 318 315 186  MCV 93.0 92.5 96.1  MCH 34.6* 34.0 34.8*  MCHC 37.2* 36.7* 36.3*  RDW 18.6* 18.5* 18.6*    Recent Labs  Lab 03/04/23 2320 03/05/23 0034 03/05/23 0122 03/05/23 0345 03/05/23 0448 03/05/23 1005 03/05/23 1246 03/05/23 1400 03/05/23 1832 03/05/23 2055 03/06/23 0630  NA 117* 115*  --  119* 121*  --  119* 122* 123* 124* 128*  K <2.0* <2.0*  --  <2.0* <2.0*  --  2.2* 2.1* 2.0* 2.1* 2.3*  CL <65* <65*  --  <65* <65*  --  69* 69* 73* 74* 82*  CO2 29 31  --  32 31  --  36* 38* 36* 38* 37*  ANIONGAP NOT CALCULATED NOT CALCULATED  --  NOT CALCULATED NOT CALCULATED  --  14 15 14 12 9   GLUCOSE 155* 139*  --  151* 137*  --  126* 139* 122* 117* 110*  BUN 13 13  --  13 14  --  12 12 12 11 11   CREATININE 1.00 1.03  --  0.93 0.98  --  0.95 0.94 0.88 0.91 1.00  AST 162* 150*  --   --  161*  --   --   --   --   --   --   ALT 91* 86*  --   --  93*  --   --   --   --   --   --   ALKPHOS 133* 117  --   --  136*  --   --   --   --   --   --   BILITOT 2.1* 2.2*  --   --  2.3*  --   --   --   --   --   --   ALBUMIN 3.0* 2.9*  --   --  3.3*  --   --  2.6* 2.6* 2.6* 2.4*  INR  --   --  1.1  --   --   --   --   --   --   --   --   AMMONIA  --  43*  --   --   --   --   --   --   --   --   --   MG   --   --  0.8* 1.8  --   --   --   --   --   --   --   CALCIUM 5.4* 5.1*  --  5.2* 6.4*  6.3*   < > 9.1 7.2* 7.0* 7.0* 6.9*   < > = values in this interval not displayed.      Recent Labs  Lab 03/05/23 0034 03/05/23 0122 03/05/23 0345 03/05/23 0448 03/05/23 1246 03/05/23 1400 03/05/23 1832 03/05/23 2055 03/06/23 0630  INR  --  1.1  --   --   --   --   --   --   --   AMMONIA 43*  --   --   --   --   --   --   --   --   MG  --  0.8* 1.8  --   --   --   --   --   --   CALCIUM 5.1*  --  5.2*   < > 9.1 7.2* 7.0* 7.0* 6.9*   < > = values in this interval not displayed.     Radiology Reports [place] ASCITES (ABDOMEN LIMITED)  Result Date: 03/05/2023 CLINICAL DATA:  [id number] Ascites [id number] EXAM: LIMITED ABDOMEN ULTRASOUND FOR ASCITES TECHNIQUE: Limited ultrasound survey for ascites was performed in all four abdominal quadrants. COMPARISON:  Ct abd/pelvis 08/26/2021, ultrasound abdomen 09/07/2021 FINDINGS: No ascites.  Hepatic steatosis. IMPRESSION: 1. No ascites. 2. Hepatic steatosis. Electronically Signed   By: Tish Frederickson M.D.   On: 03/05/2023 03:53   CT HEAD WO CONTRAST ( )  Result Date: 03/05/2023 CLINICAL DATA:  Head trauma, abnormal mental status. EXAM: CT HEAD WITHOUT CONTRAST TECHNIQUE: Contiguous axial images were obtained from the base of the skull through the vertex  without intravenous contrast. RADIATION DOSE REDUCTION: This exam was performed according to the departmental dose-optimization program which includes automated exposure control, adjustment of the mA and/or kV according to patient size and/or use of iterative reconstruction technique. COMPARISON:  09/07/2021. FINDINGS: Brain: No acute intracranial hemorrhage, midline shift or mass effect. No extra-axial fluid collection. Gray-white matter differentiation is within normal limits. No hydrocephalus. Vascular: No hyperdense vessel or unexpected calcification. Skull: Normal. Negative for fracture or focal lesion.  Sinuses/Orbits: A small mucosal retention cyst or polyp is noted in the right maxillary sinus. Debris is noted in the posterior nasopharynx. No acute orbital abnormality. Other: None. IMPRESSION: No acute intracranial process. Electronically Signed   By: Thornell Sartorius M.D.   On: 03/05/2023 01:41   DG Knee Complete 4 Views Right  Result Date: 03/05/2023 CLINICAL DATA:  Fall with right knee injury. EXAM: RIGHT KNEE - COMPLETE 4+ VIEW COMPARISON:  Radiographs 03/21/2011 FINDINGS: No evidence of fracture, dislocation, or joint effusion. No evidence of arthropathy or other focal bone abnormality. Soft tissues are unremarkable. IMPRESSION: Negative. Electronically Signed   By: Minerva Fester M.D.   On: 03/05/2023 00:24      Signature  -   Susa Raring M.D on 03/06/2023 at 9:39 AM   -  To page go to [email or web address]

## 2023-03-06 NOTE — Progress Notes (Signed)
Subjective:  Pt was seen bedside this AM. States he is feeling better, and has slightly more energy.  Objective Vital signs in last 24 hours: Vitals:   03/05/23 1959 03/05/23 2337 03/06/23 0420 03/06/23 0800  BP: 107/67 106/60 104/62 109/62  Pulse: 84 90 96 93  Resp: (!) 23 (!) 22 (!) 22 20  Temp: 98.1 F (36.7 C) 98.7 F (37.1 C) 98.7 F (37.1 C) 98.6 F (37 C)  TempSrc: Oral Axillary Oral   SpO2: 96% 94% 91% 96%  Weight:      Height:       Weight change:   Intake/Output Summary (Last 24 hours) at 03/06/2023 1022 Last data filed at 03/06/2023 0421 Gross per 24 hour  Intake 100 ml  Output 1000 ml  Net -900 ml    Assessment/ Plan: Pt is a 52 y.o. yo male who was admitted on 03/04/2023 with  severe electrolyte derangements  Assessment/Plan: 1. Severe Euvolemic Hyponatremia - Sodium increasing to 128 with fluids and increased PO intake. No signs of neurologic dysfunction, will continue to monitor.  2. Severe Hypokalemia - Likely in the setting of vomiting and decreased PO intake. Current potassium at 2.2, will continue to replete.  3. Hypocalcemia - Has improved significantly, currently at 6.9.   4. Alcohol withdrawal risk - per primary on CIWA with ativan  Tara Wich    Labs: Basic Metabolic Panel: Recent Labs  Lab 03/05/23 1832 03/05/23 2055 03/06/23 0630  NA 123* 124* 128*  K 2.0* 2.1* 2.3*  CL 73* 74* 82*  CO2 36* 38* 37*  GLUCOSE 122* 117* 110*  BUN 12 11 11   CREATININE 0.88 0.91 1.00  CALCIUM 7.0* 7.0* 6.9*  PHOS 4.0 4.1 3.2   Liver Function Tests: Recent Labs  Lab 03/04/23 2320 03/05/23 0034 03/05/23 0448 03/05/23 1400 03/05/23 1832 03/05/23 2055 03/06/23 0630  AST 162* 150* 161*  --   --   --   --   ALT 91* 86* 93*  --   --   --   --   ALKPHOS 133* 117 136*  --   --   --   --   BILITOT 2.1* 2.2* 2.3*  --   --   --   --   PROT 6.3* 6.1* 6.9  --   --   --   --   ALBUMIN 3.0* 2.9* 3.3*   < > 2.6* 2.6* 2.4*   < > = values in this interval not  displayed.   Recent Labs  Lab 03/05/23 0034  LIPASE 36   Recent Labs  Lab 03/05/23 0034  AMMONIA 43*   CBC: Recent Labs  Lab 03/05/23 0034 03/05/23 0434 03/06/23 0535  WBC 13.1* 13.2* 6.0  HGB 13.8 14.0 11.5*  HCT 37.1* 38.1* 31.7*  MCV 93.0 92.5 96.1  PLT 318 315 186   Cardiac Enzymes: No results for input(s): "CKTOTAL", "CKMB", "CKMBINDEX", "TROPONINI" in the last 168 hours. CBG: No results for input(s): "GLUCAP" in the last 168 hours.  Iron Studies: No results for input(s): "IRON", "TIBC", "TRANSFERRIN", "FERRITIN" in the last 72 hours. Studies/Results: [place] ASCITES (ABDOMEN LIMITED)  Result Date: 03/05/2023 CLINICAL DATA:  [id number] Ascites [id number] EXAM: LIMITED ABDOMEN ULTRASOUND FOR ASCITES TECHNIQUE: Limited ultrasound survey for ascites was performed in all four abdominal quadrants. COMPARISON:  Ct abd/pelvis 08/26/2021, ultrasound abdomen 09/07/2021 FINDINGS: No ascites.  Hepatic steatosis. IMPRESSION: 1. No ascites. 2. Hepatic steatosis. Electronically Signed   By: Tish Frederickson M.D.   On: 03/05/2023 03:53  CT HEAD WO CONTRAST ( )  Result Date: 03/05/2023 CLINICAL DATA:  Head trauma, abnormal mental status. EXAM: CT HEAD WITHOUT CONTRAST TECHNIQUE: Contiguous axial images were obtained from the base of the skull through the vertex without intravenous contrast. RADIATION DOSE REDUCTION: This exam was performed according to the departmental dose-optimization program which includes automated exposure control, adjustment of the mA and/or kV according to patient size and/or use of iterative reconstruction technique. COMPARISON:  09/07/2021. FINDINGS: Brain: No acute intracranial hemorrhage, midline shift or mass effect. No extra-axial fluid collection. Gray-white matter differentiation is within normal limits. No hydrocephalus. Vascular: No hyperdense vessel or unexpected calcification. Skull: Normal. Negative for fracture or focal lesion. Sinuses/Orbits: A small mucosal  retention cyst or polyp is noted in the right maxillary sinus. Debris is noted in the posterior nasopharynx. No acute orbital abnormality. Other: None. IMPRESSION: No acute intracranial process. Electronically Signed   By: Thornell Sartorius M.D.   On: 03/05/2023 01:41   DG Knee Complete 4 Views Right  Result Date: 03/05/2023 CLINICAL DATA:  Fall with right knee injury. EXAM: RIGHT KNEE - COMPLETE 4+ VIEW COMPARISON:  Radiographs 03/21/2011 FINDINGS: No evidence of fracture, dislocation, or joint effusion. No evidence of arthropathy or other focal bone abnormality. Soft tissues are unremarkable. IMPRESSION: Negative. Electronically Signed   By: Minerva Fester M.D.   On: 03/05/2023 00:24   Medications: Infusions:  sodium chloride 75 mL/hr at 03/06/23 0948   levETIRAcetam 500 mg (03/06/23 0852)   magnesium sulfate bolus IVPB 2 g (03/06/23 0933)   potassium chloride 10 mEq (03/06/23 0954)    Scheduled Medications:  allopurinol  100 mg Oral Daily   chlordiazePOXIDE  25 mg Oral TID   Followed by   chlordiazePOXIDE  25 mg Oral BH-qamhs   Followed by   Melene Muller ON 03/07/2023] chlordiazePOXIDE  25 mg Oral Daily   enoxaparin (LOVENOX) injection  40 mg Subcutaneous Daily   folic acid  1 mg Oral Daily   gabapentin  300 mg Oral TID   lactulose  30 g Oral BID   metoprolol succinate  25 mg Oral Daily   multivitamin with minerals  1 tablet Oral Daily   pantoprazole  40 mg Oral Daily   potassium chloride  40 mEq Oral TID   QUEtiapine  200 mg Oral QHS   thiamine  100 mg Oral Daily   Or   thiamine  100 mg Intravenous Daily    have reviewed scheduled and prn medications.  Physical Exam: General: Well appearing male, in no acute distress Heart: Normal rate and rhythm  Lungs: Clear to auscultation bilaterally  Abdomen: Nontender, nondistended Extremities: No lower extremity edema appreciated    03/06/2023,10:22 AM  LOS: 1 day

## 2023-03-06 NOTE — Evaluation (Signed)
Clinical/Bedside Swallow Evaluation Patient Details  Name: Darrin Koman MRN: [id number] Date of Birth: 1970/10/09  Today's Date: 03/06/2023 Time: SLP Start Time (ACUTE ONLY): 1100 SLP Stop Time (ACUTE ONLY): 1115 SLP Time Calculation (min) (ACUTE ONLY): 15 min  Past Medical History:  Past Medical History:  Diagnosis Date   Alcohol abuse    Anxiety    at age 52   Blood transfusion without reported diagnosis    Cirrhosis (HCC)    Depression    at age 71   GERD (gastroesophageal reflux disease)    Hypertension    Nausea and vomiting    Psychosis (HCC)    PTSD (post-traumatic stress disorder)    Schizoaffective disorder    Seizure disorder (HCC)    related to etoh seizure   Symptomatic anemia    Past Surgical History:  Past Surgical History:  Procedure Laterality Date   ESOPHAGOGASTRODUODENOSCOPY (EGD) WITH PROPOFOL N/A 05/05/2020   Procedure: ESOPHAGOGASTRODUODENOSCOPY (EGD) WITH PROPOFOL;  Surgeon: Napoleon Form, MD;  Location: [facility];  Service: Endoscopy;  Laterality: N/A;   FOOT SURGERY     right   HAND SURGERY     HPI:  51 y.o. male with a history of alcohol abuse, withdrawal-related seizure, alcoholic cirrhosis, schizoaffective disorder, GERD who presented to the ED on 03/04/2023 by EMS due to generally feeling unwell, weakness, poor oral intake, and abdominal distention/discomfort. He was found to have many severe electrolyte derangements, early alcohol withdrawal symptoms, he was found to be profoundly hyponatremic, hypokalemic as well.    Assessment / Plan / Recommendation  Clinical Impression  Pt demonstrates no signs of aspiration or dysphagia and denies difficulty. No dx of pna. Recommend pt continue regular diet and thin liquids. No SLP f/u needed will sign off.      Aspiration Risk       Diet Recommendation Regular;Thin liquid    Liquid Administration via: Straw;Cup Medication Administration: Whole meds with liquid Supervision: Patient able to  self feed Postural Changes: Seated upright at 90 degrees    Other  Recommendations      Recommendations for follow up therapy are one component of a multi-disciplinary discharge planning process, led by the attending physician.  Recommendations may be updated based on patient status, additional functional criteria and insurance authorization.    Swallow Study   General HPI: 52 y.o. male with a history of alcohol abuse, withdrawal-related seizure, alcoholic cirrhosis, schizoaffective disorder, GERD who presented to the ED on 03/04/2023 by EMS due to generally feeling unwell, weakness, poor oral intake, and abdominal distention/discomfort. He was found to have many severe electrolyte derangements, early alcohol withdrawal symptoms, he was found to be profoundly hyponatremic, hypokalemic as well. Type of Study: Bedside Swallow Evaluation Diet Prior to this Study: Regular;Thin liquids (Level 0) Temperature Spikes Noted: No History of Recent Intubation: No Behavior/Cognition: Alert;Cooperative;Pleasant mood Oral Cavity Assessment: Within Functional Limits Self-Feeding Abilities: Able to feed self Patient Positioning: Upright in bed Baseline Vocal Quality: Normal Volitional Cough: Strong Volitional Swallow: Able to elicit    Oral/Motor/Sensory Function Overall Oral Motor/Sensory Function: Within functional limits   Ice Chips     Thin Liquid Thin Liquid: Within functional limits    Nectar Thick Nectar Thick Liquid: Not tested   Honey Thick Honey Thick Liquid: Not tested   Puree Puree: Within functional limits   Solid     Solid: Within functional limits      Genette Huertas, Riley Nearing 03/06/2023,1:22 PM

## 2023-03-07 ENCOUNTER — Inpatient Hospital Stay (HOSPITAL_COMMUNITY): Payer: Medicare Other

## 2023-03-07 DIAGNOSIS — F10930 Alcohol use, unspecified with withdrawal, uncomplicated: Secondary | ICD-10-CM | POA: Diagnosis not present

## 2023-03-07 LAB — COMPREHENSIVE METABOLIC PANEL
ALT: 127 U/L — ABNORMAL HIGH (ref 0–44)
AST: 248 U/L — ABNORMAL HIGH (ref 15–41)
Albumin: 2.4 g/dL — ABNORMAL LOW (ref 3.5–5.0)
Alkaline Phosphatase: 120 U/L (ref 38–126)
Anion gap: 12 (ref 5–15)
BUN: 10 mg/dL (ref 6–20)
CO2: 29 mmol/L (ref 22–32)
Calcium: 7.7 mg/dL — ABNORMAL LOW (ref 8.9–10.3)
Chloride: 87 mmol/L — ABNORMAL LOW (ref 98–111)
Creatinine, Ser: 0.84 mg/dL (ref 0.61–1.24)
GFR, Estimated: >60 mL/min (ref >60)
Glucose, Bld: 138 mg/dL — ABNORMAL HIGH (ref 70–99)
Potassium: 3.9 mmol/L (ref 3.5–5.1)
Sodium: 128 mmol/L — ABNORMAL LOW (ref 135–145)
Total Bilirubin: 3.5 mg/dL — ABNORMAL HIGH (ref 0.3–1.2)
Total Protein: 5.2 g/dL — ABNORMAL LOW (ref 6.5–8.1)

## 2023-03-07 LAB — CBC WITH DIFFERENTIAL/PLATELET
Abs Immature Granulocytes: 0.16 10*3/uL — ABNORMAL HIGH (ref 0.00–0.07)
Basophils Absolute: 0 10*3/uL (ref 0.0–0.1)
Basophils Relative: 0 %
Eosinophils Absolute: 0 10*3/uL (ref 0.0–0.5)
Eosinophils Relative: 0 %
HCT: 30 % — ABNORMAL LOW (ref 39.0–52.0)
Hemoglobin: 10.6 g/dL — ABNORMAL LOW (ref 13.0–17.0)
Immature Granulocytes: 2 %
Lymphocytes Relative: 14 %
Lymphs Abs: 1.5 10*3/uL (ref 0.7–4.0)
MCH: 34.6 pg — ABNORMAL HIGH (ref 26.0–34.0)
MCHC: 35.3 g/dL (ref 30.0–36.0)
MCV: 98 fL (ref 80.0–100.0)
Monocytes Absolute: 0.6 10*3/uL (ref 0.1–1.0)
Monocytes Relative: 5 %
Neutro Abs: 8.7 10*3/uL — ABNORMAL HIGH (ref 1.7–7.7)
Neutrophils Relative %: 79 %
Platelets: 216 10*3/uL (ref 150–400)
RBC: 3.06 MIL/uL — ABNORMAL LOW (ref 4.22–5.81)
RDW: 18.9 % — ABNORMAL HIGH (ref 11.5–15.5)
WBC: 10.9 10*3/uL — ABNORMAL HIGH (ref 4.0–10.5)
nRBC: 0.6 % — ABNORMAL HIGH (ref 0.0–0.2)

## 2023-03-07 LAB — CALCIUM, IONIZED: Calcium, Ionized, Serum: 2.8 mg/dL — ABNORMAL LOW (ref 4.5–5.6)

## 2023-03-07 LAB — OSMOLALITY: Osmolality: 281 mOsm/kg (ref 275–295)

## 2023-03-07 LAB — CREATININE, URINE, RANDOM: Creatinine, Urine: 88 mg/dL

## 2023-03-07 LAB — BRAIN NATRIURETIC PEPTIDE: B Natriuretic Peptide: 106.2 pg/mL — ABNORMAL HIGH (ref 0.0–100.0)

## 2023-03-07 LAB — OSMOLALITY, URINE: Osmolality, Ur: 463 mOsm/kg (ref 300–900)

## 2023-03-07 LAB — SODIUM, URINE, RANDOM: Sodium, Ur: 97 mmol/L

## 2023-03-07 LAB — URIC ACID: Uric Acid, Serum: 4.3 mg/dL (ref 3.7–8.6)

## 2023-03-07 LAB — MAGNESIUM: Magnesium: 2 mg/dL (ref 1.7–2.4)

## 2023-03-07 MED ORDER — CALCIUM GLUCONATE-NACL 2-0.675 GM/100ML-% IV SOLN
2.0000 g | Freq: Once | INTRAVENOUS | Status: AC
  Administered 2023-03-07: 2000 mg via INTRAVENOUS
  Filled 2023-03-07: qty 100

## 2023-03-07 MED ORDER — METHYLPREDNISOLONE SODIUM SUCC 40 MG IJ SOLR
20.0000 mg | Freq: Once | INTRAMUSCULAR | Status: AC
  Administered 2023-03-07: 20 mg via INTRAVENOUS
  Filled 2023-03-07: qty 1

## 2023-03-07 MED ORDER — CHLORDIAZEPOXIDE HCL 5 MG PO CAPS
10.0000 mg | ORAL_CAPSULE | Freq: Three times a day (TID) | ORAL | Status: DC
  Administered 2023-03-07 – 2023-03-08 (×3): 10 mg via ORAL
  Filled 2023-03-07 (×3): qty 2

## 2023-03-07 NOTE — Plan of Care (Signed)
  Problem: Education: Goal: Knowledge of General Education information will improve Description: Including pain rating scale, medication(s)/side effects and non-pharmacologic comfort measures Outcome: Progressing   Problem: Health Behavior/Discharge Planning: Goal: Ability to manage health-related needs will improve Outcome: Progressing   Problem: Clinical Measurements: Goal: Ability to maintain clinical measurements within normal limits will improve Outcome: Progressing Goal: Will remain free from infection Outcome: Progressing Goal: Diagnostic test results will improve Outcome: Progressing Goal: Respiratory complications will improve Outcome: Progressing Goal: Cardiovascular complication will be avoided Outcome: Progressing   Problem: Activity: Goal: Risk for activity intolerance will decrease Outcome: Progressing   Problem: Nutrition: Goal: Adequate nutrition will be maintained Outcome: Progressing   Problem: Coping: Goal: Level of anxiety will decrease Outcome: Progressing   Problem: Elimination: Goal: Will not experience complications related to bowel motility Outcome: Progressing Goal: Will not experience complications related to urinary retention Outcome: Progressing   Problem: Pain Managment: Goal: General experience of comfort will improve Outcome: Progressing   Problem: Safety: Goal: Ability to remain free from injury will improve Outcome: Progressing   Problem: Skin Integrity: Goal: Risk for impaired skin integrity will decrease Outcome: Progressing   Problem: Education: Goal: Knowledge of General Education information will improve Description: Including pain rating scale, medication(s)/side effects and non-pharmacologic comfort measures Outcome: Progressing   Problem: Health Behavior/Discharge Planning: Goal: Ability to manage health-related needs will improve Outcome: Progressing   Problem: Clinical Measurements: Goal: Ability to maintain  clinical measurements within normal limits will improve Outcome: Progressing   Problem: Clinical Measurements: Goal: Will remain free from infection Outcome: Progressing   Problem: Clinical Measurements: Goal: Diagnostic test results will improve Outcome: Progressing   Problem: Clinical Measurements: Goal: Respiratory complications will improve Outcome: Progressing   Problem: Clinical Measurements: Goal: Cardiovascular complication will be avoided Outcome: Progressing   Problem: Coping: Goal: Level of anxiety will decrease Outcome: Progressing   Problem: Nutrition: Goal: Adequate nutrition will be maintained Outcome: Progressing   Problem: Elimination: Goal: Will not experience complications related to bowel motility Outcome: Progressing   Problem: Skin Integrity: Goal: Risk for impaired skin integrity will decrease Outcome: Progressing

## 2023-03-07 NOTE — Evaluation (Signed)
Physical Therapy Evaluation Patient Details Name: [name] MRN: [id number] DOB: 05-15-1971 Today's Date: 03/07/2023  History of Present Illness  Pt is a 52 y/o male admitted for hyponatremia, hypokalemia and other electrolyte derangements in setting of etoh abuse. PMH: seizures, gout, neuropathy, schizoaffective disorder  Clinical Impression   Pt admitted secondary to problem above with deficits below. PTA patient was living with his mother and assisting her with IADLs. He reports occasional use of RW when legs feel weak (usually after a hospitalization).  Pt currently requires min assist with +2 for chair follow with ambulation x 25 ft with RW. Legs are shaky and incr reliance on bil UEs via RW. Patient currently unsafe to discharge home unless he has 24 hr assist (which he does not).  Anticipate patient will benefit from PT to address problems listed below.Will continue to follow acutely to maximize functional mobility independence and safety.           Assistance Recommended at Discharge Frequent or constant Supervision/Assistance  If plan is discharge home, recommend the following:  Can travel by private vehicle  A little help with walking and/or transfers;Assistance with cooking/housework;Direct supervision/assist for medications management;Direct supervision/assist for financial management;Assist for transportation;Help with stairs or ramp for entrance   Yes    Equipment Recommendations None recommended by PT  Recommendations for Other Services       Functional Status Assessment Patient has had a recent decline in their functional status and demonstrates the ability to make significant improvements in function in a reasonable and predictable amount of time.     Precautions / Restrictions Precautions Precautions: Fall;Other (comment) Precaution Comments: L foot/ankle pain Restrictions Weight Bearing Restrictions: No      Mobility  Bed Mobility Overal bed mobility:  Modified Independent             General bed mobility comments: HOB 10 and no rail    Transfers Overall transfer level: Needs assistance Equipment used: Rolling walker (2 wheels) Transfers: Sit to/from Stand, Bed to chair/wheelchair/BSC Sit to Stand: Min guard   Step pivot transfers: Min assist, +2 safety/equipment       General transfer comment: pt shaky and min assist to guide/maneuver RW)    Ambulation/Gait Ambulation/Gait assistance: Min assist, +2 safety/equipment Gait Distance (Feet): 25 Feet Assistive device: Rolling walker (2 wheels) Gait Pattern/deviations: Step-through pattern, Decreased stride length, Shuffle, Trunk flexed   Gait velocity interpretation: <1.8 ft/sec, indicate of risk for recurrent falls   General Gait Details: shaky with chair follow for safety; assist to steer RW/avoid obstacles; heavy reliance on Bil UEs  Stairs            Wheelchair Mobility     Tilt Bed    Modified Rankin (Stroke Patients Only)       Balance Overall balance assessment: Needs assistance Sitting-balance support: No upper extremity supported, Feet supported Sitting balance-Leahy Scale: Good     Standing balance support: Bilateral upper extremity supported, Reliant on assistive device for balance Standing balance-Leahy Scale: Poor                               Pertinent Vitals/Pain Pain Assessment Pain Assessment: Faces Faces Pain Scale: Hurts a little bit Pain Location: L foot/ankle Pain Descriptors / Indicators: Guarding, Shooting Pain Intervention(s): Limited activity within patient's tolerance, Premedicated before session    Home Living Family/patient expects to be discharged to:: Private residence Living Arrangements: Parent Available Help at Discharge:  Family;Available PRN/intermittently Type of Home: House Home Access: Stairs to enter   Entrance Stairs-Number of Steps: 2   Home Layout: One level Home Equipment: Agricultural consultant  (2 wheels);Shower seat Additional Comments: pt unsure where he will DC; reports mother lives with him in his sister's home and he provides assistance for IADLs for mother    Prior Function Prior Level of Function : Independent/Modified Independent             Mobility Comments: occasional use of RW when legs feel weak       Hand Dominance   Dominant Hand: Right    Extremity/Trunk Assessment   Upper Extremity Assessment Upper Extremity Assessment: Generalized weakness    Lower Extremity Assessment Lower Extremity Assessment: Generalized weakness    Cervical / Trunk Assessment Cervical / Trunk Assessment: Normal  Communication   Communication: No difficulties  Cognition Arousal/Alertness: Awake/alert Behavior During Therapy: Flat affect Overall Cognitive Status: No family/caregiver present to determine baseline cognitive functioning                                 General Comments: pleasant, follows one step commands though some decreased insight into deficits;        General Comments General comments (skin integrity, edema, etc.): VSS on RA    Exercises     Assessment/Plan    PT Assessment Patient needs continued PT services  PT Problem List Decreased strength;Decreased activity tolerance;Decreased balance;Decreased mobility;Decreased cognition;Decreased knowledge of use of DME;Decreased safety awareness;Pain       PT Treatment Interventions DME instruction;Gait training;Stair training;Functional mobility training;Therapeutic activities;Therapeutic exercise;Cognitive remediation;Patient/family education    PT Goals (Current goals can be found in the Care Plan section)  Acute Rehab PT Goals Patient Stated Goal: get well and go home PT Goal Formulation: With patient Time For Goal Achievement: 03/21/23 Potential to Achieve Goals: Good    Frequency Min 2X/week     Co-evaluation               AM-PAC PT "6 Clicks" Mobility  Outcome  Measure Help needed turning from your back to your side while in a flat bed without using bedrails?: None Help needed moving from lying on your back to sitting on the side of a flat bed without using bedrails?: None Help needed moving to and from a bed to a chair (including a wheelchair)?: A Little Help needed standing up from a chair using your arms (e.g., wheelchair or bedside chair)?: A Little Help needed to walk in hospital room?: Total Help needed climbing 3-5 steps with a railing? : Total 6 Click Score: 16    End of Session Equipment Utilized During Treatment: Gait belt Activity Tolerance: Patient limited by fatigue Patient left: in chair;with call bell/phone within reach;with chair alarm set Nurse Communication: Mobility status PT Visit Diagnosis: Unsteadiness on feet (R26.81);Other abnormalities of gait and mobility (R26.89);Muscle weakness (generalized) (M62.81)    Time: 1610-9604 PT Time Calculation (min) (ACUTE ONLY): 25 min   Charges:   PT Evaluation $PT Eval Low Complexity: 1 Low PT Treatments $Gait Training: 8-22 mins PT General Charges $$ ACUTE PT VISIT: 1 Visit          Jerolyn Center, PT Acute Rehabilitation Services  Office [phone]   Zena Amos 03/07/2023, 11:18 AM

## 2023-03-07 NOTE — Progress Notes (Signed)
PROGRESS NOTE                                                                                                                                                                                                             Patient Demographics:    [name], is a 52 y.o. male, DOB - [birth year], AVW:[id number]  Outpatient Primary MD for the patient is Georganna Skeans, MD    LOS - 2  Admit date - 03/04/2023    Chief Complaint  Patient presents with   Alcohol Intoxication       Brief Narrative (HPI from H&P)   52 y.o. male with a history of alcohol abuse, withdrawal-related seizure, alcoholic cirrhosis, schizoaffective disorder, GERD who presented to the ED on 03/04/2023 by EMS due to generally feeling unwell, weakness, poor oral intake, and abdominal distention/discomfort. He was found to have many severe electrolyte derangements, early alcohol withdrawal symptoms, he was found to be profoundly hyponatremic, hypokalemic as well.    Subjective:   Patient in bed, appears comfortable, denies any headache, no fever, no chest pain or pressure, no shortness of breath , no abdominal pain. No focal weakness.  Left ankle pain and tenderness typical of his acute gout attacks.   Assessment  & Plan :   Dehydration - Hyponatremia (relative access of free water) Hypokalemia, hypochloremia , Hypocalcemia -  Renal following, has been hydrated with IV fluids, DW Dr Marisue Humble 03/06/23, electrolytes replaced, restrict free water, monitor.  Defer management to renal for electrolytes.  Alcohol abuse - DTs - counseled, CIWA + librium.  History of seizure in setting of alcohol withdrawal: Maintained on chronic keppra.   Schizoaffective disorder:  Hold SSRI in setting of severe hyponatremia currently,  Continue seroquel at bedtime, Psych input.  Fall at home: R knee XR negative.  PT evaluation.  Fall precautions.  Gout: Acute on chronic.  Uric acid  level stable, hold allopurinol, colchicine and steroids.  Peripheral neuropathy: Continue gabapentin to avoid withdrawal.  HTN: Continue home metoprolol.        Condition - Extremely Guarded  Family Communication  :  None  Code Status :  Full  Consults  :  Renal, Psych  PUD Prophylaxis : PPI   Procedures  :     CT head - stable  Abd [place] - no ascites  Disposition Plan  :    Status is: Inpatient   DVT Prophylaxis  :    Place TED hose Start: 03/07/23 0826 enoxaparin (LOVENOX) injection 40 mg Start: 03/05/23 0600 SCDs Start: 03/05/23 1610    Lab Results  Component Value Date   PLT 216 03/07/2023    Diet :  Diet Order             Diet Heart Room service appropriate? Yes; Fluid consistency: Thin; Fluid restriction: 1200 mL Fluid  Diet effective now                    Inpatient Medications  Scheduled Meds:  allopurinol  100 mg Oral Daily   chlordiazePOXIDE  25 mg Oral Daily   colchicine  0.3 mg Oral BID   enoxaparin (LOVENOX) injection  40 mg Subcutaneous Daily   folic acid  1 mg Oral Daily   gabapentin  300 mg Oral TID   lactulose  30 g Oral BID   methylPREDNISolone (SOLU-MEDROL) injection  20 mg Intravenous Once   metoprolol succinate  25 mg Oral Daily   multivitamin with minerals  1 tablet Oral Daily   nicotine  21 mg Transdermal Daily   pantoprazole  40 mg Oral Daily   QUEtiapine  200 mg Oral QHS   thiamine  100 mg Oral Daily   Or   thiamine  100 mg Intravenous Daily   Continuous Infusions:  calcium gluconate     levETIRAcetam 500 mg (03/07/23 0756)   PRN Meds:.acetaminophen, chlordiazePOXIDE, hydrOXYzine, loperamide, LORazepam **OR** LORazepam, ondansetron (ZOFRAN) IV, traMADol  Antibiotics  :    Anti-infectives (From admission, onward)    None         Objective:   Vitals:   03/06/23 1505 03/06/23 1922 03/07/23 0100 03/07/23 0400  BP: 123/80 117/77 116/80 102/69  Pulse: (!) 103 88 82 80  Resp: (!) 24 (!) 22 17 17    Temp: 98 F (36.7 C) 98 F (36.7 C) (!) 97.4 F (36.3 C)   TempSrc:  Oral Oral   SpO2: 99% 95% 92% 96%  Weight:      Height:        Wt Readings from Last 3 Encounters:  03/05/23 99.8 kg  09/15/22 106.1 kg  07/30/22 107.4 kg     Intake/Output Summary (Last 24 hours) at 03/07/2023 0826 Last data filed at 03/07/2023 0600 Gross per 24 hour  Intake --  Output 500 ml  Net -500 ml     Physical Exam  Awake Alert, No new F.N deficits, mild Dts [place].AT,PERRAL Supple Neck, No JVD,   Symmetrical Chest wall movement, Good air movement bilaterally, CTAB RRR,No Gallops,Rubs or new Murmurs,  +ve B.Sounds, Abd Soft, No tenderness,   No Cyanosis, Clubbing or edema      Data Review:    Recent Labs  Lab 03/05/23 0034 03/05/23 0434 03/06/23 0535 03/07/23 0304  WBC 13.1* 13.2* 6.0 10.9*  HGB 13.8 14.0 11.5* 10.6*  HCT 37.1* 38.1* 31.7* 30.0*  PLT 318 315 186 216  MCV 93.0 92.5 96.1 98.0  MCH 34.6* 34.0 34.8* 34.6*  MCHC 37.2* 36.7* 36.3* 35.3  RDW 18.6* 18.5* 18.6* 18.9*  LYMPHSABS  --   --   --  1.5  MONOABS  --   --   --  0.6  EOSABS  --   --   --  0.0  BASOSABS  --   --   --  0.0    Recent Labs  Lab 03/04/23 2320 03/05/23 0034 03/05/23 0122 03/05/23 0345 03/05/23 0448 03/05/23 1005 03/05/23 1832 03/05/23 2055 03/06/23 0535 03/06/23 0630 03/06/23 0730 03/06/23 0908 03/06/23 1455 03/07/23 0304  NA 117* 115*  --  119* 121*   < > 123* 124*  --  128*  --  138 130* 128*  K <2.0* <2.0*  --  <2.0* <2.0*   < > 2.0* 2.1*  --  2.3*  --  3.6 2.9* 3.9  CL <65* <65*  --  <65* <65*   < > 73* 74*  --  82*  --  98 77* 87*  CO2 29 31  --  32 31   < > 36* 38*  --  37*  --  30 37* 29  ANIONGAP NOT CALCULATED NOT CALCULATED  --  NOT CALCULATED NOT CALCULATED   < > 14 12  --  9  --  10 16* 12  GLUCOSE 155* 139*  --  151* 137*   < > 122* 117*  --  110*  --  80 160* 138*  BUN 13 13  --  13 14   < > 12 11  --  11  --  <5* 12 10  CREATININE 1.00 1.03  --  0.93 0.98   < > 0.88 0.91   --  1.00  --  1.23 1.14 0.84  AST 162* 150*  --   --  161*  --   --   --   --   --   --   --   --  248*  ALT 91* 86*  --   --  93*  --   --   --   --   --   --   --   --  127*  ALKPHOS 133* 117  --   --  136*  --   --   --   --   --   --   --   --  120  BILITOT 2.1* 2.2*  --   --  2.3*  --   --   --   --   --   --   --   --  3.5*  ALBUMIN 3.0* 2.9*  --   --  3.3*   < > 2.6* 2.6*  --  2.4*  --  2.0*  --  2.4*  INR  --   --  1.1  --   --   --   --   --   --   --   --   --   --   --   AMMONIA  --  43*  --   --   --   --   --   --   --   --   --   --   --   --   BNP  --   --   --   --   --   --   --   --   --   --  98.8  --   --  106.2*  MG  --   --  0.8* 1.8  --   --   --   --  1.7  --   --   --   --  2.0  CALCIUM 5.4* 5.1*  --  5.2* 6.4*  6.3*   < > 7.0* 7.0*  --  6.9*  --  7.9* 7.8* 7.7*   < > = values  in this interval not displayed.      Recent Labs  Lab 03/05/23 0034 03/05/23 0122 03/05/23 0345 03/05/23 0448 03/05/23 2055 03/06/23 0535 03/06/23 0630 03/06/23 0730 03/06/23 0908 03/06/23 1455 03/07/23 0304  INR  --  1.1  --   --   --   --   --   --   --   --   --   AMMONIA 43*  --   --   --   --   --   --   --   --   --   --   BNP  --   --   --   --   --   --   --  98.8  --   --  106.2*  MG  --  0.8* 1.8  --   --  1.7  --   --   --   --  2.0  CALCIUM 5.1*  --  5.2*   < > 7.0*  --  6.9*  --  7.9* 7.8* 7.7*   < > = values in this interval not displayed.     Radiology Reports [place] ASCITES (ABDOMEN LIMITED)  Result Date: 03/05/2023 CLINICAL DATA:  [id number] Ascites [id number] EXAM: LIMITED ABDOMEN ULTRASOUND FOR ASCITES TECHNIQUE: Limited ultrasound survey for ascites was performed in all four abdominal quadrants. COMPARISON:  Ct abd/pelvis 08/26/2021, ultrasound abdomen 09/07/2021 FINDINGS: No ascites.  Hepatic steatosis. IMPRESSION: 1. No ascites. 2. Hepatic steatosis. Electronically Signed   By: Tish Frederickson M.D.   On: 03/05/2023 03:53   CT HEAD WO CONTRAST ( )  Result Date:  03/05/2023 CLINICAL DATA:  Head trauma, abnormal mental status. EXAM: CT HEAD WITHOUT CONTRAST TECHNIQUE: Contiguous axial images were obtained from the base of the skull through the vertex without intravenous contrast. RADIATION DOSE REDUCTION: This exam was performed according to the departmental dose-optimization program which includes automated exposure control, adjustment of the mA and/or kV according to patient size and/or use of iterative reconstruction technique. COMPARISON:  09/07/2021. FINDINGS: Brain: No acute intracranial hemorrhage, midline shift or mass effect. No extra-axial fluid collection. Gray-white matter differentiation is within normal limits. No hydrocephalus. Vascular: No hyperdense vessel or unexpected calcification. Skull: Normal. Negative for fracture or focal lesion. Sinuses/Orbits: A small mucosal retention cyst or polyp is noted in the right maxillary sinus. Debris is noted in the posterior nasopharynx. No acute orbital abnormality. Other: None. IMPRESSION: No acute intracranial process. Electronically Signed   By: Thornell Sartorius M.D.   On: 03/05/2023 01:41   DG Knee Complete 4 Views Right  Result Date: 03/05/2023 CLINICAL DATA:  Fall with right knee injury. EXAM: RIGHT KNEE - COMPLETE 4+ VIEW COMPARISON:  Radiographs 03/21/2011 FINDINGS: No evidence of fracture, dislocation, or joint effusion. No evidence of arthropathy or other focal bone abnormality. Soft tissues are unremarkable. IMPRESSION: Negative. Electronically Signed   By: Minerva Fester M.D.   On: 03/05/2023 00:24      Signature  -   Susa Raring M.D on 03/07/2023 at 8:26 AM   -  To page go to [email or web address]

## 2023-03-07 NOTE — Progress Notes (Signed)
Admit: 03/04/2023 LOS: 2  46M heavy ETOH use with hyponatremia, hypokalemia, hypoMg, hypoCa  Subjective:  Na and K slowly improving Toerating PO  07/19 0701 - 07/20 0700 In: -  Out: 500 [Urine:500]  Filed Weights   03/05/23 0313  Weight: 99.8 kg    Scheduled Meds:  chlordiazePOXIDE  25 mg Oral Daily   colchicine  0.3 mg Oral BID   enoxaparin (LOVENOX) injection  40 mg Subcutaneous Daily   folic acid  1 mg Oral Daily   gabapentin  300 mg Oral TID   lactulose  30 g Oral BID   metoprolol succinate  25 mg Oral Daily   multivitamin with minerals  1 tablet Oral Daily   nicotine  21 mg Transdermal Daily   pantoprazole  40 mg Oral Daily   QUEtiapine  200 mg Oral QHS   thiamine  100 mg Oral Daily   Or   thiamine  100 mg Intravenous Daily   Continuous Infusions:  levETIRAcetam 500 mg (03/07/23 0756)   PRN Meds:.acetaminophen, chlordiazePOXIDE, hydrOXYzine, loperamide, LORazepam **OR** LORazepam, ondansetron (ZOFRAN) IV, traMADol  Current Labs: reviewed    Physical Exam:  Blood pressure 102/69, pulse 80, temperature 97.6 F (36.4 C), temperature source Oral, resp. rate 17, height 6' (1.829 m), weight 99.8 kg, SpO2 96%. NAD, chronically ill appearing Regular Diminished in bases S/nt No LEE Nonfocal  A Euvolemic hypotonic hyponatremia 2/2 low solute intake / high free water; improved with IVFs/PO.  Will cont to correct as he eats.  No need for additioan IVFs Hypokalemia: improving with repletion HypoMg. Stable HypoCa improved ETOH Abuse: on librium.  Needs outpt plan for abstinence Inc LFTs  P No further suggestions, slowly correcting which was the goal Will s/o please call for additional questions  Patient status and daily plan discussed with Dr. Lorenda Cahill MD 03/07/2023, 10:44 AM  Recent Labs  Lab 03/05/23 2055 03/06/23 0630 03/06/23 0908 03/06/23 1455 03/07/23 0304  NA 124* 128* 138 130* 128*  K 2.1* 2.3* 3.6 2.9* 3.9  CL 74* 82* 98 77* 87*   CO2 38* 37* 30 37* 29  GLUCOSE 117* 110* 80 160* 138*  BUN 11 11 <5* 12 10  CREATININE 0.91 1.00 1.23 1.14 0.84  CALCIUM 7.0* 6.9* 7.9* 7.8* 7.7*  PHOS 4.1 3.2 2.2*  --   --    Recent Labs  Lab 03/05/23 0434 03/06/23 0535 03/07/23 0304  WBC 13.2* 6.0 10.9*  NEUTROABS  --   --  8.7*  HGB 14.0 11.5* 10.6*  HCT 38.1* 31.7* 30.0*  MCV 92.5 96.1 98.0  PLT 315 186 216

## 2023-03-07 NOTE — Plan of Care (Signed)
  Problem: Education: Goal: Knowledge of General Education information will improve Description: Including pain rating scale, medication(s)/side effects and non-pharmacologic comfort measures Outcome: Progressing   Problem: Health Behavior/Discharge Planning: Goal: Ability to manage health-related needs will improve Outcome: Progressing   Problem: Clinical Measurements: Goal: Ability to maintain clinical measurements within normal limits will improve Outcome: Progressing Goal: Will remain free from infection Outcome: Progressing Goal: Diagnostic test results will improve Outcome: Progressing Goal: Respiratory complications will improve Outcome: Progressing Goal: Cardiovascular complication will be avoided Outcome: Progressing   Problem: Activity: Goal: Risk for activity intolerance will decrease Outcome: Progressing   Problem: Nutrition: Goal: Adequate nutrition will be maintained Outcome: Progressing   Problem: Coping: Goal: Level of anxiety will decrease Outcome: Progressing   Problem: Elimination: Goal: Will not experience complications related to bowel motility Outcome: Progressing Goal: Will not experience complications related to urinary retention Outcome: Progressing   Problem: Pain Managment: Goal: General experience of comfort will improve Outcome: Progressing   Problem: Safety: Goal: Ability to remain free from injury will improve Outcome: Progressing   Problem: Skin Integrity: Goal: Risk for impaired skin integrity will decrease Outcome: Progressing   Problem: Education: Goal: Knowledge of General Education information will improve Description: Including pain rating scale, medication(s)/side effects and non-pharmacologic comfort measures Outcome: Progressing   Problem: Health Behavior/Discharge Planning: Goal: Ability to manage health-related needs will improve Outcome: Progressing   Problem: Clinical Measurements: Goal: Ability to maintain  clinical measurements within normal limits will improve Outcome: Progressing   Problem: Clinical Measurements: Goal: Will remain free from infection Outcome: Progressing   Problem: Clinical Measurements: Goal: Diagnostic test results will improve Outcome: Progressing   Problem: Clinical Measurements: Goal: Respiratory complications will improve Outcome: Progressing   Problem: Clinical Measurements: Goal: Cardiovascular complication will be avoided Outcome: Progressing   Problem: Activity: Goal: Risk for activity intolerance will decrease Outcome: Progressing   Problem: Elimination: Goal: Will not experience complications related to urinary retention Outcome: Progressing   Problem: Elimination: Goal: Will not experience complications related to urinary retention Outcome: Progressing   Problem: Pain Managment: Goal: General experience of comfort will improve Outcome: Progressing   Problem: Elimination: Goal: Will not experience complications related to bowel motility Outcome: Progressing   Problem: Skin Integrity: Goal: Risk for impaired skin integrity will decrease Outcome: Progressing   Problem: Safety: Goal: Ability to remain free from injury will improve Outcome: Progressing

## 2023-03-08 DIAGNOSIS — F10930 Alcohol use, unspecified with withdrawal, uncomplicated: Secondary | ICD-10-CM | POA: Diagnosis not present

## 2023-03-08 LAB — COMPREHENSIVE METABOLIC PANEL
ALT: 125 U/L — ABNORMAL HIGH (ref 0–44)
AST: 175 U/L — ABNORMAL HIGH (ref 15–41)
Albumin: 2.3 g/dL — ABNORMAL LOW (ref 3.5–5.0)
Alkaline Phosphatase: 119 U/L (ref 38–126)
Anion gap: 7 (ref 5–15)
BUN: 12 mg/dL (ref 6–20)
CO2: 31 mmol/L (ref 22–32)
Calcium: 8 mg/dL — ABNORMAL LOW (ref 8.9–10.3)
Chloride: 93 mmol/L — ABNORMAL LOW (ref 98–111)
Creatinine, Ser: 0.84 mg/dL (ref 0.61–1.24)
GFR, Estimated: >60 mL/min (ref >60)
Glucose, Bld: 96 mg/dL (ref 70–99)
Potassium: 3.6 mmol/L (ref 3.5–5.1)
Sodium: 131 mmol/L — ABNORMAL LOW (ref 135–145)
Total Bilirubin: 1.3 mg/dL — ABNORMAL HIGH (ref 0.3–1.2)
Total Protein: 5.2 g/dL — ABNORMAL LOW (ref 6.5–8.1)

## 2023-03-08 LAB — CBC WITH DIFFERENTIAL/PLATELET
Abs Immature Granulocytes: 0.5 10*3/uL — ABNORMAL HIGH (ref 0.00–0.07)
Basophils Absolute: 0 10*3/uL (ref 0.0–0.1)
Basophils Relative: 0 %
Eosinophils Absolute: 0.1 10*3/uL (ref 0.0–0.5)
Eosinophils Relative: 1 %
HCT: 30.2 % — ABNORMAL LOW (ref 39.0–52.0)
Hemoglobin: 10.4 g/dL — ABNORMAL LOW (ref 13.0–17.0)
Immature Granulocytes: 5 %
Lymphocytes Relative: 26 %
Lymphs Abs: 2.9 10*3/uL (ref 0.7–4.0)
MCH: 34.9 pg — ABNORMAL HIGH (ref 26.0–34.0)
MCHC: 34.4 g/dL (ref 30.0–36.0)
MCV: 101.3 fL — ABNORMAL HIGH (ref 80.0–100.0)
Monocytes Absolute: 0.6 10*3/uL (ref 0.1–1.0)
Monocytes Relative: 5 %
Neutro Abs: 7 10*3/uL (ref 1.7–7.7)
Neutrophils Relative %: 63 %
Platelets: 233 10*3/uL (ref 150–400)
RBC: 2.98 MIL/uL — ABNORMAL LOW (ref 4.22–5.81)
RDW: 19.3 % — ABNORMAL HIGH (ref 11.5–15.5)
WBC: 11.2 10*3/uL — ABNORMAL HIGH (ref 4.0–10.5)
nRBC: 0.2 % (ref 0.0–0.2)

## 2023-03-08 LAB — BRAIN NATRIURETIC PEPTIDE: B Natriuretic Peptide: 249.7 pg/mL — ABNORMAL HIGH (ref 0.0–100.0)

## 2023-03-08 LAB — MAGNESIUM: Magnesium: 1.7 mg/dL (ref 1.7–2.4)

## 2023-03-08 MED ORDER — LORAZEPAM 1 MG PO TABS
1.0000 mg | ORAL_TABLET | ORAL | Status: AC | PRN
  Administered 2023-03-08: 1 mg via ORAL
  Filled 2023-03-08: qty 1

## 2023-03-08 MED ORDER — CHLORDIAZEPOXIDE HCL 5 MG PO CAPS
20.0000 mg | ORAL_CAPSULE | Freq: Three times a day (TID) | ORAL | Status: DC
  Administered 2023-03-08 – 2023-03-12 (×12): 20 mg via ORAL
  Filled 2023-03-08 (×12): qty 4

## 2023-03-08 MED ORDER — ADULT MULTIVITAMIN W/MINERALS CH: 1.0000 | ORAL_TABLET | Freq: Every day | ORAL | Status: DC

## 2023-03-08 MED ORDER — LORAZEPAM 2 MG/ML IJ SOLN: 1.0000 mg | INTRAMUSCULAR | Status: AC | PRN

## 2023-03-08 NOTE — Progress Notes (Signed)
PROGRESS NOTE                                                                                                                                                                                                             Patient Demographics:    [name], is a 52 y.o. male, DOB - 06-20-1971, NWG:[id number]  Outpatient Primary MD for the patient is Georganna Skeans, MD    LOS - 3  Admit date - 03/04/2023    Chief Complaint  Patient presents with   Alcohol Intoxication       Brief Narrative (HPI from H&P)   52 y.o. male with a history of alcohol abuse, withdrawal-related seizure, alcoholic cirrhosis, schizoaffective disorder, GERD who presented to the ED on 03/04/2023 by EMS due to generally feeling unwell, weakness, poor oral intake, and abdominal distention/discomfort. He was found to have many severe electrolyte derangements, early alcohol withdrawal symptoms, he was found to be profoundly hyponatremic, hypokalemic as well.    Subjective:   Patient in bed, appears comfortable, denies any headache, no fever, no chest pain or pressure, no shortness of breath , no abdominal pain. No focal weakness.  Left ankle gout pain much improved   Assessment  & Plan :   Dehydration - Hyponatremia (relative access of free water) Hypokalemia, hypochloremia , Hypocalcemia -  Renal following, has been hydrated with IV fluids, DW Dr Marisue Humble 03/06/23, electrolytes replaced, restrict free water, monitor.  Defer management to renal for electrolytes.  Alcohol abuse - DTs - counseled, CIWA + librium.  History of seizure in setting of alcohol withdrawal: Maintained on chronic keppra.   Schizoaffective disorder:  Hold SSRI in setting of severe hyponatremia currently,  Continue seroquel at bedtime, Psych input.  Fall at home: R knee XR negative.  PT evaluation.  Fall precautions.  Gout: Acute on chronic.  Uric acid level stable, hold allopurinol,  colchicine and steroids x2.  Peripheral neuropathy: Continue gabapentin to avoid withdrawal.  HTN: Continue home metoprolol.   Blurry vision in both eyes for the last 3 to 4 weeks.  Outpatient ophthalmology follow-up, CT head unremarkable x 2.       Condition - Extremely Guarded  Family Communication  :  None  Code Status :  Full  Consults  :  Renal, Psych  PUD Prophylaxis : PPI   Procedures  :  CT head x 2 - stable  Abd [place] - no ascites      Disposition Plan  :    Status is: Inpatient   DVT Prophylaxis  :    Place TED hose Start: 03/07/23 0826 enoxaparin (LOVENOX) injection 40 mg Start: 03/05/23 0600 SCDs Start: 03/05/23 4540    Lab Results  Component Value Date   PLT 233 03/08/2023    Diet :  Diet Order             Diet regular Room service appropriate? Yes; Fluid consistency: Thin; Fluid restriction: 1200 mL Fluid  Diet effective now                    Inpatient Medications  Scheduled Meds:  chlordiazePOXIDE  10 mg Oral TID   colchicine  0.3 mg Oral BID   enoxaparin (LOVENOX) injection  40 mg Subcutaneous Daily   folic acid  1 mg Oral Daily   gabapentin  300 mg Oral TID   lactulose  30 g Oral BID   metoprolol succinate  25 mg Oral Daily   multivitamin with minerals  1 tablet Oral Daily   multivitamin with minerals  1 tablet Oral Daily   nicotine  21 mg Transdermal Daily   pantoprazole  40 mg Oral Daily   QUEtiapine  200 mg Oral QHS   thiamine  100 mg Oral Daily   Or   thiamine  100 mg Intravenous Daily   Continuous Infusions:  levETIRAcetam 500 mg (03/08/23 0822)   PRN Meds:.acetaminophen, LORazepam **OR** LORazepam, ondansetron (ZOFRAN) IV, traMADol  Antibiotics  :    Anti-infectives (From admission, onward)    None         Objective:   Vitals:   03/07/23 1600 03/08/23 0400 03/08/23 0800 03/08/23 0808  BP: 114/82 (!) 90/54  110/82  Pulse: 87 77  69  Resp: 19 15    Temp: (!) 97.2 F (36.2 C) 98 F (36.7 C) (!)  97.2 F (36.2 C)   TempSrc: Oral Oral Oral   SpO2: 97% 95%    Weight:      Height:        Wt Readings from Last 3 Encounters:  03/05/23 99.8 kg  09/15/22 106.1 kg  07/30/22 107.4 kg     Intake/Output Summary (Last 24 hours) at 03/08/2023 1058 Last data filed at 03/08/2023 0730 Gross per 24 hour  Intake 240 ml  Output 1150 ml  Net -910 ml     Physical Exam  Awake Alert, No new F.N deficits, mild Dts [place].AT,PERRAL Supple Neck, No JVD,   Symmetrical Chest wall movement, Good air movement bilaterally, CTAB RRR,No Gallops,Rubs or new Murmurs,  +ve B.Sounds, Abd Soft, No tenderness,   No Cyanosis, Clubbing or edema      Data Review:    Recent Labs  Lab 03/05/23 0034 03/05/23 0434 03/06/23 0535 03/07/23 0304 03/08/23 0652  WBC 13.1* 13.2* 6.0 10.9* 11.2*  HGB 13.8 14.0 11.5* 10.6* 10.4*  HCT 37.1* 38.1* 31.7* 30.0* 30.2*  PLT 318 315 186 216 233  MCV 93.0 92.5 96.1 98.0 101.3*  MCH 34.6* 34.0 34.8* 34.6* 34.9*  MCHC 37.2* 36.7* 36.3* 35.3 34.4  RDW 18.6* 18.5* 18.6* 18.9* 19.3*  LYMPHSABS  --   --   --  1.5 2.9  MONOABS  --   --   --  0.6 0.6  EOSABS  --   --   --  0.0 0.1  BASOSABS  --   --   --  0.0 0.0    Recent Labs  Lab 03/04/23 2320 03/05/23 0034 03/05/23 0122 03/05/23 0345 03/05/23 0448 03/05/23 1005 03/05/23 2055 03/06/23 0535 03/06/23 0630 03/06/23 0730 03/06/23 0908 03/06/23 1455 03/07/23 0304 03/08/23 0652  NA 117* 115*  --  119* 121*   < > 124*  --  128*  --  138 130* 128* 131*  K <2.0* <2.0*  --  <2.0* <2.0*   < > 2.1*  --  2.3*  --  3.6 2.9* 3.9 3.6  CL <65* <65*  --  <65* <65*   < > 74*  --  82*  --  98 77* 87* 93*  CO2 29 31  --  32 31   < > 38*  --  37*  --  30 37* 29 31  ANIONGAP NOT CALCULATED NOT CALCULATED  --  NOT CALCULATED NOT CALCULATED   < > 12  --  9  --  10 16* 12 7  GLUCOSE 155* 139*  --  151* 137*   < > 117*  --  110*  --  80 160* 138* 96  BUN 13 13  --  13 14   < > 11  --  11  --  <5* 12 10 12   CREATININE 1.00 1.03   --  0.93 0.98   < > 0.91  --  1.00  --  1.23 1.14 0.84 0.84  AST 162* 150*  --   --  161*  --   --   --   --   --   --   --  248* 175*  ALT 91* 86*  --   --  93*  --   --   --   --   --   --   --  127* 125*  ALKPHOS 133* 117  --   --  136*  --   --   --   --   --   --   --  120 119  BILITOT 2.1* 2.2*  --   --  2.3*  --   --   --   --   --   --   --  3.5* 1.3*  ALBUMIN 3.0* 2.9*  --   --  3.3*   < > 2.6*  --  2.4*  --  2.0*  --  2.4* 2.3*  INR  --   --  1.1  --   --   --   --   --   --   --   --   --   --   --   AMMONIA  --  43*  --   --   --   --   --   --   --   --   --   --   --   --   BNP  --   --   --   --   --   --   --   --   --  98.8  --   --  106.2* 249.7*  MG  --   --  0.8* 1.8  --   --   --  1.7  --   --   --   --  2.0 1.7  CALCIUM 5.4* 5.1*  --  5.2* 6.4*  6.3*   < > 7.0*  --  6.9*  --  7.9* 7.8* 7.7* 8.0*   < > = values in this interval not  displayed.      Radiology Reports CT HEAD WO CONTRAST ( )  Result Date: 03/07/2023 CLINICAL DATA:  Mental status change, unknown cause EXAM: CT HEAD WITHOUT CONTRAST TECHNIQUE: Contiguous axial images were obtained from the base of the skull through the vertex without intravenous contrast. RADIATION DOSE REDUCTION: This exam was performed according to the departmental dose-optimization program which includes automated exposure control, adjustment of the mA and/or kV according to patient size and/or use of iterative reconstruction technique. COMPARISON:  03/05/2023 FINDINGS: Brain: Mild age advanced cerebral atrophy. No acute intracranial abnormality. Specifically, no hemorrhage, hydrocephalus, mass lesion, acute infarction, or significant intracranial injury. Vascular: No hyperdense vessel or unexpected calcification. Skull: No acute calvarial abnormality. Sinuses/Orbits: No acute findings Other: None IMPRESSION: Mild age advanced atrophy. No acute intracranial abnormality. Electronically Signed   By: Charlett Nose M.D.   On: 03/07/2023 19:20    [place] ASCITES (ABDOMEN LIMITED)  Result Date: 03/05/2023 CLINICAL DATA:  [id number] Ascites [id number] EXAM: LIMITED ABDOMEN ULTRASOUND FOR ASCITES TECHNIQUE: Limited ultrasound survey for ascites was performed in all four abdominal quadrants. COMPARISON:  Ct abd/pelvis 08/26/2021, ultrasound abdomen 09/07/2021 FINDINGS: No ascites.  Hepatic steatosis. IMPRESSION: 1. No ascites. 2. Hepatic steatosis. Electronically Signed   By: Tish Frederickson M.D.   On: 03/05/2023 03:53   CT HEAD WO CONTRAST ( )  Result Date: 03/05/2023 CLINICAL DATA:  Head trauma, abnormal mental status. EXAM: CT HEAD WITHOUT CONTRAST TECHNIQUE: Contiguous axial images were obtained from the base of the skull through the vertex without intravenous contrast. RADIATION DOSE REDUCTION: This exam was performed according to the departmental dose-optimization program which includes automated exposure control, adjustment of the mA and/or kV according to patient size and/or use of iterative reconstruction technique. COMPARISON:  09/07/2021. FINDINGS: Brain: No acute intracranial hemorrhage, midline shift or mass effect. No extra-axial fluid collection. Gray-white matter differentiation is within normal limits. No hydrocephalus. Vascular: No hyperdense vessel or unexpected calcification. Skull: Normal. Negative for fracture or focal lesion. Sinuses/Orbits: A small mucosal retention cyst or polyp is noted in the right maxillary sinus. Debris is noted in the posterior nasopharynx. No acute orbital abnormality. Other: None. IMPRESSION: No acute intracranial process. Electronically Signed   By: Thornell Sartorius M.D.   On: 03/05/2023 01:41   DG Knee Complete 4 Views Right  Result Date: 03/05/2023 CLINICAL DATA:  Fall with right knee injury. EXAM: RIGHT KNEE - COMPLETE 4+ VIEW COMPARISON:  Radiographs 03/21/2011 FINDINGS: No evidence of fracture, dislocation, or joint effusion. No evidence of arthropathy or other focal bone abnormality. Soft tissues are  unremarkable. IMPRESSION: Negative. Electronically Signed   By: Minerva Fester M.D.   On: 03/05/2023 00:24      Signature  -   Susa Raring M.D on 03/08/2023 at 10:58 AM   -  To page go to [email or web address]

## 2023-03-08 NOTE — NC FL2 (Signed)
[place] MEDICAID FL2 LEVEL OF CARE FORM     IDENTIFICATION  Patient Name: [name] Birthdate: 22-Sep-1970 Sex: male Admission Date (Current Location): 03/04/2023  [facility] and [place] Number:  Producer, television/film/video and Address:  [facility] [place], [address] [place], [place] [address]      Provider Number: [id number]  Attending Physician Name and Address:  Leroy Sea, MD  Relative Name and Phone Number:       Current Level of Care: Hospital Recommended Level of Care: Skilled Nursing Facility Prior Approval Number:    Date Approved/Denied:   PASRR Number: to be updated  Discharge Plan: SNF    Current Diagnoses: Patient Active Problem List   Diagnosis Date Noted   Hypervolemic hyponatremia 03/05/2023   Hypocalcemia 03/05/2023   Hepatic cirrhosis (HCC) 03/05/2023   History of seizure 03/05/2023   Leukocytosis 03/05/2023   Chronic anemia 03/05/2023   Schizoaffective disorder (HCC) 03/05/2023   Transaminitis 03/05/2023   Elevated bilirubin 03/05/2023   Hypochloremia 03/05/2023   Alcohol withdrawal (HCC) 02/02/2022   Hyperammonemia (HCC) 02/02/2022   Alcohol withdrawal syndrome without complication (HCC) 01/10/2022   Acute gout 01/10/2022   Elevated liver function tests    Nausea and vomiting    Trigger finger, right middle finger 05/20/2021   Alcoholic cirrhosis of liver with ascites (HCC) 03/08/2021   Polysubstance abuse (HCC) 03/08/2021   Secondary esophageal varices without bleeding (HCC) 03/08/2021   Macrocytic anemia    Hepatic steatosis    Hypomagnesemia 05/03/2020   Hyperbilirubinemia 05/03/2020   Alcoholism (HCC) 05/03/2020   Alcoholic hepatitis without ascites 03/06/2019   Essential hypertension 01/10/2019   Alcohol intoxication (HCC) 01/10/2019   Schizoaffective disorder, bipolar type (HCC) 06/02/2018   Alcohol-induced mood disorder (HCC) 02/22/2016   Gout 10/22/2015   GERD (gastroesophageal reflux disease)  10/22/2015   Schizoaffective disorder, depressive type (HCC)    History of alcohol abuse    Psychosis (HCC) 05/13/2015   Tobacco dependence due to cigarettes 11/20/2014   Abnormal LFTs    Alcohol abuse 02/09/2014   PTSD (post-traumatic stress disorder) 11/03/2011   Hypokalemia 07/02/2011    Orientation RESPIRATION BLADDER Height & Weight     Situation, Time, Self, Place  Normal Incontinent Weight: 220 lb (99.8 kg) Height:  6' (182.9 cm)  BEHAVIORAL SYMPTOMS/MOOD NEUROLOGICAL BOWEL NUTRITION STATUS      Continent Diet  AMBULATORY STATUS COMMUNICATION OF NEEDS Skin   Limited Assist Verbally Normal                       Personal Care Assistance Level of Assistance  Dressing Bathing Assistance: Limited assistance   Dressing Assistance: Limited assistance     Functional Limitations Info             SPECIAL CARE FACTORS FREQUENCY  PT (By licensed PT), OT (By licensed OT)     PT Frequency: 3 to 5 times a week OT Frequency: 3 to 5 times a week            Contractures      Additional Factors Info  Code Status Code Status Info: Full             Current Medications (03/08/2023):  This is the current hospital active medication list Current Facility-Administered Medications  Medication Dose Route Frequency Provider Last Rate Last Admin   acetaminophen (TYLENOL) tablet 650 mg  650 mg Oral Q6H PRN Tereasa Coop, MD  chlordiazePOXIDE (LIBRIUM) capsule 20 mg  20 mg Oral TID Leroy Sea, MD       colchicine tablet 0.3 mg  0.3 mg Oral BID Susa Raring K, MD   0.3 mg at 03/08/23 0559   enoxaparin (LOVENOX) injection 40 mg  40 mg Subcutaneous Daily Sundil, Subrina, MD   40 mg at 03/07/23 1311   folic acid (FOLVITE) tablet 1 mg  1 mg Oral Daily Sundil, Subrina, MD   1 mg at 03/08/23 4098   gabapentin (NEURONTIN) capsule 300 mg  300 mg Oral TID Tyrone Nine, MD   300 mg at 03/08/23 0807   lactulose (CHRONULAC) 10 GM/15ML solution 30 g  30 g Oral BID  Hazeline Junker B, MD   30 g at 03/08/23 0808   levETIRAcetam (KEPPRA) IVPB 500 mg/100 mL premix  500 mg Intravenous BID Sundil, Subrina, MD 400 mL/hr at 03/08/23 0822 500 mg at 03/08/23 1191   LORazepam (ATIVAN) tablet 1-4 mg  1-4 mg Oral Q1H PRN Leroy Sea, MD   1 mg at 03/08/23 1124   Or   LORazepam (ATIVAN) injection 1-4 mg  1-4 mg Intravenous Q1H PRN Leroy Sea, MD       metoprolol succinate (TOPROL-XL) 24 hr tablet 25 mg  25 mg Oral Daily Tyrone Nine, MD   25 mg at 03/08/23 4782   multivitamin with minerals tablet 1 tablet  1 tablet Oral Daily Sundil, Subrina, MD   1 tablet at 03/08/23 9562   nicotine (NICODERM CQ - dosed in mg/24 hours) patch 21 mg  21 mg Transdermal Daily Carollee Herter, DO   21 mg at 03/08/23 0824   ondansetron (ZOFRAN) injection 4 mg  4 mg Intravenous Q6H PRN Sundil, Subrina, MD   4 mg at 03/05/23 0631   pantoprazole (PROTONIX) EC tablet 40 mg  40 mg Oral Daily Leroy Sea, MD   40 mg at 03/08/23 1308   QUEtiapine (SEROQUEL) tablet 200 mg  200 mg Oral QHS Hazeline Junker B, MD   200 mg at 03/07/23 2231   thiamine (VITAMIN B1) tablet 100 mg  100 mg Oral Daily Sundil, Subrina, MD   100 mg at 03/08/23 6578   Or   thiamine (VITAMIN B1) injection 100 mg  100 mg Intravenous Daily Sundil, Subrina, MD       traMADol Janean Sark) tablet 50 mg  50 mg Oral Q6H PRN Leroy Sea, MD   50 mg at 03/07/23 1013     Discharge Medications: Please see discharge summary for a list of discharge medications.  Relevant Imaging Results:  Relevant Lab Results:   Additional Information SS#: [id number]  Helene Kelp, LCSW

## 2023-03-08 NOTE — TOC Initial Note (Signed)
Transition of Care [facility]) - Initial/Assessment Note    Patient Details  Name: [name] MRN: [id number] Date of Birth: [birth year]  Transition of Care [facility]) CM/SW Contact:    Helene Kelp, LCSW Phone Number: 03/08/2023, 1:53 PM  Clinical Narrative:                 CSW followed-up patient disposition regarding SNF Rehab referrals.   Clinical team collaboration: CSW met with the CN and bedside nurse to review the pt's disposition. Clinical members noted the pt's disposition is for SNF placement..    CSW made contact with the following: CSW followed-up with the patient and natural supports to update them on current disposition efforts.  Pt orientation reviewed/assessed prior to communication (orientation x4). CSW met with the pt at the bedside and reviewed their current disposition. CSW reviewed referral process and assessed for pt's preference for SNF.  Patient  expressed he would like to go to [place] CSW noted he will update the clinical team, who will manage his disposition the coming week.   Clinical team update: CSW updated members of the clinical team to this writer efforts to support the patient's current disposition and information noted above.   CSW disposition support provided: CSW completed SNF referrals for the pt.  FL2: completed  PASSR: to be obtained, this writer was unable to access the site to complete the PASSR.  Disposition follow-up: TOC-CSW/CM to follow and provide the pt with bed offers once updated in [facility] destination section. Please update the clinical team, pt natural supports/emergency contacts if they were contacted. Please follow-up with clinical support to move the pt's disposition forward with placement efforts made.   Please obtain the PASSR to complete disposition support for the pt's SNF placement.   No other needs identified at this current time for this Clinical research associate. Unit CSW to follow and continue efforts and progress made to support the  patient's disposition.     Expected Discharge Plan: Skilled Nursing Facility Barriers to Discharge: Continued Medical Work up   Patient Goals and CMS Choice   CMS Medicare.gov Compare Post Acute Care list provided to:: Patient Choice offered to / list presented to : Patient [place] ownership interest in [facility].provided to:: Patient    Expected Discharge Plan and Services     Post Acute Care Choice: Skilled Nursing Facility Living arrangements for the past 2 months: Single Family Home                                      Prior Living Arrangements/Services Living arrangements for the past 2 months: Single Family Home   Patient language and need for interpreter reviewed:: No Do you feel safe going back to the place where you live?: Yes      Need for Family Participation in Patient Care: Yes (Comment) Care giver support system in place?: Yes (comment)      Activities of Daily Living      Permission Sought/Granted Permission sought to share information with : Facility Medical sales representative, Family Supports, Case Manager                Emotional Assessment Appearance:: Appears stated age Attitude/Demeanor/Rapport: Gracious Affect (typically observed): Accepting, Stable Orientation: : Oriented to Place, Oriented to Self, Oriented to  Time, Oriented to Situation Alcohol / Substance Use: Not Applicable Psych Involvement: No (comment)  Admission diagnosis:  Hypokalemia [E87.6] Hypomagnesemia [  E83.42] Hyponatremia [E87.1] Alcohol withdrawal syndrome without complication (HCC) [F10.930] Patient Active Problem List   Diagnosis Date Noted   Hypervolemic hyponatremia 03/05/2023   Hypocalcemia 03/05/2023   Hepatic cirrhosis (HCC) 03/05/2023   History of seizure 03/05/2023   Leukocytosis 03/05/2023   Chronic anemia 03/05/2023   Schizoaffective disorder (HCC) 03/05/2023   Transaminitis 03/05/2023   Elevated bilirubin 03/05/2023    Hypochloremia 03/05/2023   Alcohol withdrawal (HCC) 02/02/2022   Hyperammonemia (HCC) 02/02/2022   Alcohol withdrawal syndrome without complication (HCC) 01/10/2022   Acute gout 01/10/2022   Elevated liver function tests    Nausea and vomiting    Trigger finger, right middle finger 05/20/2021   Alcoholic cirrhosis of liver with ascites (HCC) 03/08/2021   Polysubstance abuse (HCC) 03/08/2021   Secondary esophageal varices without bleeding (HCC) 03/08/2021   Macrocytic anemia    Hepatic steatosis    Hypomagnesemia 05/03/2020   Hyperbilirubinemia 05/03/2020   Alcoholism (HCC) 05/03/2020   Alcoholic hepatitis without ascites 03/06/2019   Essential hypertension 01/10/2019   Alcohol intoxication (HCC) 01/10/2019   Schizoaffective disorder, bipolar type (HCC) 06/02/2018   Alcohol-induced mood disorder (HCC) 02/22/2016   Gout 10/22/2015   GERD (gastroesophageal reflux disease) 10/22/2015   Schizoaffective disorder, depressive type (HCC)    History of alcohol abuse    Psychosis (HCC) 05/13/2015   Tobacco dependence due to cigarettes 11/20/2014   Abnormal LFTs    Alcohol abuse 02/09/2014   PTSD (post-traumatic stress disorder) 11/03/2011   Hypokalemia 07/02/2011   PCP:  Georganna Skeans, MD Pharmacy:   [facility], [place] - [address] [place] [address] Phone: [phone] Fax: [phone]  [id number] - [place], [place] - [address] AT [facility] [address] [place] [address] Phone: [phone] Fax: [phone]     Social Determinants of Health (SDOH) Social History: SDOH Screenings   Food Insecurity: No Food Insecurity (10/18/2019)  Transportation Needs: Unmet Transportation Needs (01/13/2022)  Utilities: Not At Risk (10/06/2022)  Alcohol Screen: Medium Risk (06/02/2018)  Depression (PHQ2-9): Medium Risk (01/15/2022)  Tobacco Use: High Risk (03/04/2023)    SDOH Interventions:     Readmission Risk Interventions    09/19/2021   12:21 PM  Readmission Risk Prevention Plan  HRI or Home Care Consult Complete  Social Work Consult for Recovery Care Planning/Counseling Complete  Palliative Care Screening Not Applicable

## 2023-03-09 ENCOUNTER — Inpatient Hospital Stay (HOSPITAL_COMMUNITY): Payer: Medicare Other

## 2023-03-09 DIAGNOSIS — F10930 Alcohol use, unspecified with withdrawal, uncomplicated: Secondary | ICD-10-CM | POA: Diagnosis not present

## 2023-03-09 LAB — CBC WITH DIFFERENTIAL/PLATELET
Abs Immature Granulocytes: 0.7 10*3/uL — ABNORMAL HIGH (ref 0.00–0.07)
Basophils Absolute: 0.1 10*3/uL (ref 0.0–0.1)
Basophils Relative: 1 %
Eosinophils Absolute: 0.1 10*3/uL (ref 0.0–0.5)
Eosinophils Relative: 1 %
HCT: 31.8 % — ABNORMAL LOW (ref 39.0–52.0)
Hemoglobin: 10.8 g/dL — ABNORMAL LOW (ref 13.0–17.0)
Immature Granulocytes: 8 %
Lymphocytes Relative: 32 %
Lymphs Abs: 3 10*3/uL (ref 0.7–4.0)
MCH: 34.2 pg — ABNORMAL HIGH (ref 26.0–34.0)
MCHC: 34 g/dL (ref 30.0–36.0)
MCV: 100.6 fL — ABNORMAL HIGH (ref 80.0–100.0)
Monocytes Absolute: 0.4 10*3/uL (ref 0.1–1.0)
Monocytes Relative: 5 %
Neutro Abs: 5 10*3/uL (ref 1.7–7.7)
Neutrophils Relative %: 53 %
Platelets: 261 10*3/uL (ref 150–400)
RBC: 3.16 MIL/uL — ABNORMAL LOW (ref 4.22–5.81)
RDW: 20 % — ABNORMAL HIGH (ref 11.5–15.5)
WBC: 9.3 10*3/uL (ref 4.0–10.5)
nRBC: 0.3 % — ABNORMAL HIGH (ref 0.0–0.2)

## 2023-03-09 LAB — COMPREHENSIVE METABOLIC PANEL
ALT: 128 U/L — ABNORMAL HIGH (ref 0–44)
AST: 168 U/L — ABNORMAL HIGH (ref 15–41)
Albumin: 2.3 g/dL — ABNORMAL LOW (ref 3.5–5.0)
Alkaline Phosphatase: 129 U/L — ABNORMAL HIGH (ref 38–126)
Anion gap: 12 (ref 5–15)
BUN: 18 mg/dL (ref 6–20)
CO2: 26 mmol/L (ref 22–32)
Calcium: 8 mg/dL — ABNORMAL LOW (ref 8.9–10.3)
Chloride: 94 mmol/L — ABNORMAL LOW (ref 98–111)
Creatinine, Ser: 1.04 mg/dL (ref 0.61–1.24)
GFR, Estimated: >60 mL/min (ref >60)
Glucose, Bld: 138 mg/dL — ABNORMAL HIGH (ref 70–99)
Potassium: 3.7 mmol/L (ref 3.5–5.1)
Sodium: 132 mmol/L — ABNORMAL LOW (ref 135–145)
Total Bilirubin: 1.3 mg/dL — ABNORMAL HIGH (ref 0.3–1.2)
Total Protein: 5.2 g/dL — ABNORMAL LOW (ref 6.5–8.1)

## 2023-03-09 LAB — AMMONIA: Ammonia: 55 umol/L — ABNORMAL HIGH (ref 9–35)

## 2023-03-09 LAB — MAGNESIUM: Magnesium: 1.6 mg/dL — ABNORMAL LOW (ref 1.7–2.4)

## 2023-03-09 LAB — BRAIN NATRIURETIC PEPTIDE: B Natriuretic Peptide: 102.5 pg/mL — ABNORMAL HIGH (ref 0.0–100.0)

## 2023-03-09 MED ORDER — FUROSEMIDE 10 MG/ML IJ SOLN: 20.0000 mg | Freq: Once | INTRAMUSCULAR | Status: AC

## 2023-03-09 MED ORDER — MAGNESIUM SULFATE 4 GM/100ML IV SOLN
4.0000 g | Freq: Once | INTRAVENOUS | Status: AC
  Administered 2023-03-09: 4 g via INTRAVENOUS
  Filled 2023-03-09: qty 100

## 2023-03-09 MED ORDER — FUROSEMIDE 10 MG/ML IJ SOLN
INTRAMUSCULAR | Status: AC
  Administered 2023-03-09: 20 mg via INTRAVENOUS
  Filled 2023-03-09: qty 2

## 2023-03-09 MED ORDER — RIFAXIMIN 550 MG PO TABS
550.0000 mg | ORAL_TABLET | Freq: Two times a day (BID) | ORAL | Status: DC
  Administered 2023-03-09 – 2023-03-12 (×7): 550 mg via ORAL
  Filled 2023-03-09 (×7): qty 1

## 2023-03-09 MED ORDER — LACTULOSE 10 GM/15ML PO SOLN
30.0000 g | Freq: Three times a day (TID) | ORAL | Status: DC
  Administered 2023-03-09 – 2023-03-12 (×10): 30 g via ORAL
  Filled 2023-03-09 (×10): qty 60

## 2023-03-09 NOTE — Progress Notes (Signed)
PROGRESS NOTE                                                                                                                                                                                                             Patient Demographics:    [name], is a 52 y.o. male, DOB - 03-31-1971, ZOX:[id number]  Outpatient Primary MD for the patient is Georganna Skeans, MD    LOS - 4  Admit date - 03/04/2023    Chief Complaint  Patient presents with   Alcohol Intoxication       Brief Narrative (HPI from H&P)   52 y.o. male with a history of alcohol abuse, withdrawal-related seizure, alcoholic cirrhosis, schizoaffective disorder, GERD who presented to the ED on 03/04/2023 by EMS due to generally feeling unwell, weakness, poor oral intake, and abdominal distention/discomfort. He was found to have many severe electrolyte derangements, early alcohol withdrawal symptoms, he was found to be profoundly hyponatremic, hypokalemic as well.    Subjective:   Patient in bed, appears comfortable, denies any headache, no fever, no chest pain or pressure, no shortness of breath , no abdominal pain. No new focal weakness.   Assessment  & Plan :   Dehydration - Hyponatremia (relative access of free water) Hypokalemia, hypochloremia , Hypocalcemia -  Renal following, has been hydrated with IV fluids, DW Dr Marisue Humble 03/06/23, electrolytes replaced, restrict free water, monitor.  Defer management to renal for electrolytes.  Alcohol abuse - DTs - counseled, CIWA + librium.  History of seizure in setting of alcohol withdrawal: Maintained on chronic keppra.   Schizoaffective disorder:  Hold SSRI in setting of severe hyponatremia currently,  Continue seroquel at bedtime, Psych input.  Fall at home: R knee XR negative.  PT evaluation.  Fall precautions.  Gout: Acute on chronic.  Uric acid level stable, hold allopurinol, colchicine and steroids  x2.  Peripheral neuropathy: Continue gabapentin to avoid withdrawal.  HTN: Continue home metoprolol.   Blurry vision in both eyes for the last 3 to 4 weeks.  Outpatient ophthalmology follow-up, CT head unremarkable x 2.  Asymptomatic transaminitis.  Likely due to alcohol abuse, bilirubin stable, trend improving, check right upper quadrant ultrasound rule out cirrhosis, ammonia is borderline has been discharged on lactulose and Xifaxan.        Condition - Extremely Guarded  Family Communication  :  None  Code Status :  Full  Consults  :  Renal, Psych  PUD Prophylaxis : PPI   Procedures  :     CT head x 2 - stable  Abd [place] - no ascites      Disposition Plan  :    Status is: Inpatient   DVT Prophylaxis  :    Place TED hose Start: 03/07/23 0826 enoxaparin (LOVENOX) injection 40 mg Start: 03/05/23 0600 SCDs Start: 03/05/23 0865    Lab Results  Component Value Date   PLT 261 03/09/2023    Diet :  Diet Order             Diet regular Room service appropriate? Yes; Fluid consistency: Thin; Fluid restriction: 1200 mL Fluid  Diet effective now                    Inpatient Medications  Scheduled Meds:  chlordiazePOXIDE  20 mg Oral TID   colchicine  0.3 mg Oral BID   enoxaparin (LOVENOX) injection  40 mg Subcutaneous Daily   folic acid  1 mg Oral Daily   furosemide  20 mg Intravenous Once   gabapentin  300 mg Oral TID   lactulose  30 g Oral TID   metoprolol succinate  25 mg Oral Daily   multivitamin with minerals  1 tablet Oral Daily   nicotine  21 mg Transdermal Daily   pantoprazole  40 mg Oral Daily   QUEtiapine  200 mg Oral QHS   rifaximin  550 mg Oral BID   thiamine  100 mg Oral Daily   Or   thiamine  100 mg Intravenous Daily   Continuous Infusions:  levETIRAcetam 500 mg (03/08/23 1651)   magnesium sulfate bolus IVPB     PRN Meds:.acetaminophen, LORazepam **OR** LORazepam, ondansetron (ZOFRAN) IV, traMADol  Antibiotics  :     Anti-infectives (From admission, onward)    Start     Dose/Rate Route Frequency Ordered Stop   03/09/23 1000  rifaximin (XIFAXAN) tablet 550 mg        550 mg Oral 2 times daily 03/09/23 0557           Objective:   Vitals:   03/08/23 2000 03/09/23 0000 03/09/23 0800 03/09/23 0923  BP: (!) 99/58 (!) 93/59  102/80  Pulse: 86 80  87  Resp: 18 14    Temp: 97.8 F (36.6 C) 98.4 F (36.9 C)    TempSrc: Axillary Axillary Axillary   SpO2: 92% 92%    Weight:      Height:        Wt Readings from Last 3 Encounters:  03/05/23 99.8 kg  09/15/22 106.1 kg  07/30/22 107.4 kg     Intake/Output Summary (Last 24 hours) at 03/09/2023 0926 Last data filed at 03/09/2023 0600 Gross per 24 hour  Intake 480 ml  Output --  Net 480 ml     Physical Exam  Awake Alert, No new F.N deficits, mild Dts [place].AT,PERRAL Supple Neck, No JVD,   Symmetrical Chest wall movement, Good air movement bilaterally, CTAB RRR,No Gallops,Rubs or new Murmurs,  +ve B.Sounds, Abd Soft, No tenderness,   No Cyanosis, Clubbing or edema      Data Review:    Recent Labs  Lab 03/05/23 0434 03/06/23 0535 03/07/23 0304 03/08/23 0652 03/09/23 0330  WBC 13.2* 6.0 10.9* 11.2* 9.3  HGB 14.0 11.5* 10.6* 10.4* 10.8*  HCT 38.1* 31.7* 30.0* 30.2* 31.8*  PLT 315 186 216 233  261  MCV 92.5 96.1 98.0 101.3* 100.6*  MCH 34.0 34.8* 34.6* 34.9* 34.2*  MCHC 36.7* 36.3* 35.3 34.4 34.0  RDW 18.5* 18.6* 18.9* 19.3* 20.0*  LYMPHSABS  --   --  1.5 2.9 3.0  MONOABS  --   --  0.6 0.6 0.4  EOSABS  --   --  0.0 0.1 0.1  BASOSABS  --   --  0.0 0.0 0.1    Recent Labs  Lab 03/05/23 0034 03/05/23 0034 03/05/23 0122 03/05/23 0345 03/05/23 0448 03/05/23 1005 03/06/23 0535 03/06/23 0630 03/06/23 0730 03/06/23 0908 03/06/23 1455 03/07/23 0304 03/08/23 0652 03/09/23 0330  NA 115*  --   --  119* 121*   < >  --  128*  --  138 130* 128* 131* 132*  K <2.0*  --   --  <2.0* <2.0*   < >  --  2.3*  --  3.6 2.9* 3.9 3.6 3.7   CL <65*  --   --  <65* <65*   < >  --  82*  --  98 77* 87* 93* 94*  CO2 31  --   --  32 31   < >  --  37*  --  30 37* 29 31 26   ANIONGAP NOT CALCULATED  --   --  NOT CALCULATED NOT CALCULATED   < >  --  9  --  10 16* 12 7 12   GLUCOSE 139*  --   --  151* 137*   < >  --  110*  --  80 160* 138* 96 138*  BUN 13  --   --  13 14   < >  --  11  --  <5* 12 10 12 18   CREATININE 1.03  --   --  0.93 0.98   < >  --  1.00  --  1.23 1.14 0.84 0.84 1.04  AST 150*  --   --   --  161*  --   --   --   --   --   --  248* 175* 168*  ALT 86*  --   --   --  93*  --   --   --   --   --   --  127* 125* 128*  ALKPHOS 117  --   --   --  136*  --   --   --   --   --   --  120 119 129*  BILITOT 2.2*  --   --   --  2.3*  --   --   --   --   --   --  3.5* 1.3* 1.3*  ALBUMIN 2.9*  --   --   --  3.3*   < >  --  2.4*  --  2.0*  --  2.4* 2.3* 2.3*  INR  --   --  1.1  --   --   --   --   --   --   --   --   --   --   --   AMMONIA 43*  --   --   --   --   --   --   --   --   --   --   --   --  55*  BNP  --   --   --   --   --   --   --   --  98.8  --   --  106.2* 249.7* 102.5*  MG  --    < > 0.8* 1.8  --   --  1.7  --   --   --   --  2.0 1.7 1.6*  CALCIUM 5.1*  --   --  5.2* 6.4*  6.3*   < >  --  6.9*  --  7.9* 7.8* 7.7* 8.0* 8.0*   < > = values in this interval not displayed.      Radiology Reports CT HEAD WO CONTRAST ( )  Result Date: 03/07/2023 CLINICAL DATA:  Mental status change, unknown cause EXAM: CT HEAD WITHOUT CONTRAST TECHNIQUE: Contiguous axial images were obtained from the base of the skull through the vertex without intravenous contrast. RADIATION DOSE REDUCTION: This exam was performed according to the departmental dose-optimization program which includes automated exposure control, adjustment of the mA and/or kV according to patient size and/or use of iterative reconstruction technique. COMPARISON:  03/05/2023 FINDINGS: Brain: Mild age advanced cerebral atrophy. No acute intracranial abnormality.  Specifically, no hemorrhage, hydrocephalus, mass lesion, acute infarction, or significant intracranial injury. Vascular: No hyperdense vessel or unexpected calcification. Skull: No acute calvarial abnormality. Sinuses/Orbits: No acute findings Other: None IMPRESSION: Mild age advanced atrophy. No acute intracranial abnormality. Electronically Signed   By: Charlett Nose M.D.   On: 03/07/2023 19:20      Signature  -   Susa Raring M.D on 03/09/2023 at 9:26 AM   -  To page go to [email or web address]

## 2023-03-09 NOTE — Care Management Important Message (Signed)
Important Message  Patient Details  Name: [name] MRN: [id number] Date of Birth: 1971-01-10   Medicare Important Message Given:  Yes     Faelynn Wynder Stefan Church 03/09/2023, 4:16 PM

## 2023-03-09 NOTE — TOC Progression Note (Addendum)
Transition of Care [facility]) - Progression Note    Patient Details  Name: Teige Rountree MRN: [id number] Date of Birth: February 10, 1971  Transition of Care [facility]) CM/SW Contact  Mearl Latin, LCSW Phone Number: 03/09/2023, 12:06 PM  Clinical Narrative:    Patient does not have any SNF bed offers. [facility] requesting 24 hours of no CIWA score or prn meds. CSW submitting pasrr. Liaison from [facility] to eval patient tomorrow.    Expected Discharge Plan: Skilled Nursing Facility Barriers to Discharge: Continued Medical Work up  Expected Discharge Plan and Services     Post Acute Care Choice: Skilled Nursing Facility Living arrangements for the past 2 months: Single Family Home                                       Social Determinants of Health (SDOH) Interventions SDOH Screenings   Food Insecurity: No Food Insecurity (10/18/2019)  Transportation Needs: Unmet Transportation Needs (01/13/2022)  Utilities: Not At Risk (10/06/2022)  Alcohol Screen: Medium Risk (06/02/2018)  Depression (PHQ2-9): Medium Risk (01/15/2022)  Tobacco Use: High Risk (03/04/2023)    Readmission Risk Interventions    09/19/2021   12:21 PM  Readmission Risk Prevention Plan  HRI or Home Care Consult Complete  Social Work Consult for Recovery Care Planning/Counseling Complete  Palliative Care Screening Not Applicable

## 2023-03-09 NOTE — Progress Notes (Signed)
OT Cancellation Note  Patient Details Name: [name] MRN: [id number] DOB: 12/24/1970   Cancelled Treatment:    Reason Eval/Treat Not Completed: Patient at procedure or test/ unavailable Ultrasound entering as OT initiated session. Will follow up as schedule permits.  Lorre Munroe 03/09/2023, 2:08 PM

## 2023-03-09 NOTE — Progress Notes (Signed)
Occupational Therapy Treatment Patient Details Name: [name] MRN: [id number] DOB: [birth year] Today's Date: 03/09/2023   History of present illness Pt is a 52 y/o male admitted for hyponatremia, hypokalemia and other electrolyte derangements in setting of etoh abuse. PMH: seizures, gout, neuropathy, schizoaffective disorder   OT comments  Pt making steady progress towards OT goals with L foot/ankle pain improved. Session encompassed sponge bathing at sink with Setup for UB ADLs and Min A for LB bathing seated/standing at sink. Pt requiring Min A to ambulate to/from sink with RW and noted BLE shakiness noted. Patient will benefit from continued inpatient follow up therapy, <3 hours/day as pt is currently at a high risk for falls.   Recommendations for follow up therapy are one component of a multi-disciplinary discharge planning process, led by the attending physician.  Recommendations may be updated based on patient status, additional functional criteria and insurance authorization.    Assistance Recommended at Discharge Intermittent Supervision/Assistance  Patient can return home with the following  A lot of help with walking and/or transfers;A lot of help with bathing/dressing/bathroom   Equipment Recommendations  Other (comment) (TBD)    Recommendations for Other Services      Precautions / Restrictions Precautions Precautions: Fall Restrictions Weight Bearing Restrictions: No       Mobility Bed Mobility Overal bed mobility: Modified Independent                  Transfers Overall transfer level: Needs assistance Equipment used: Rolling walker (2 wheels) Transfers: Sit to/from Stand Sit to Stand: Min assist           General transfer comment: Min A to stand from bedside and chair with armrests     Balance Overall balance assessment: Needs assistance Sitting-balance support: No upper extremity supported, Feet supported Sitting balance-Leahy Scale: Good      Standing balance support: Bilateral upper extremity supported, Reliant on assistive device for balance Standing balance-Leahy Scale: Poor                             ADL either performed or assessed with clinical judgement   ADL Overall ADL's : Needs assistance/impaired     Grooming: Set up;Sitting;Wash/dry face;Applying deodorant   Upper Body Bathing: Set up;Sitting   Lower Body Bathing: Minimal assistance;Sit to/from stand;Sitting/lateral leans Lower Body Bathing Details (indicate cue type and reason): assist for posterior hygiene in standing. pt able to bathe LE seated in chair Upper Body Dressing : Set up;Sitting   Lower Body Dressing: Set up;Bed level Lower Body Dressing Details (indicate cue type and reason): able to long sit in bed to don fresh socks             Functional mobility during ADLs: Minimal assistance;Rolling walker (2 wheels) General ADL Comments: limited by shakiness in standing and unsteadiness with dynamic standing ADLs    Extremity/Trunk Assessment Upper Extremity Assessment Upper Extremity Assessment: Generalized weakness   Lower Extremity Assessment Lower Extremity Assessment: Defer to PT evaluation        Vision   Vision Assessment?: No apparent visual deficits   Perception     Praxis      Cognition Arousal/Alertness: Awake/alert Behavior During Therapy: Flat affect Overall Cognitive Status: No family/caregiver present to determine baseline cognitive functioning  General Comments: pleasant, follows one step commands though some decreased insight into deficits;        Exercises      Shoulder Instructions       General Comments      Pertinent Vitals/ Pain       Pain Assessment Pain Assessment: No/denies pain  Home Living                                          Prior Functioning/Environment              Frequency  Min 2X/week         Progress Toward Goals  OT Goals(current goals can now be found in the care plan section)  Progress towards OT goals: Progressing toward goals  Acute Rehab OT Goals Patient Stated Goal: have something to eat OT Goal Formulation: With patient Time For Goal Achievement: 03/20/23 Potential to Achieve Goals: Good ADL Goals Pt Will Perform Lower Body Bathing: with min guard assist;sit to/from stand;sitting/lateral leans Pt Will Perform Lower Body Dressing: with min guard assist;sit to/from stand;sitting/lateral leans Pt Will Transfer to Toilet: with min guard assist;stand pivot transfer;bedside commode Pt/caregiver will Perform Home Exercise Program: Increased strength;Both right and left upper extremity;With theraband;Independently;With written HEP provided  Plan Discharge plan remains appropriate    Co-evaluation                 AM-PAC OT "6 Clicks" Daily Activity     Outcome Measure   Help from another person eating meals?: None Help from another person taking care of personal grooming?: A Little Help from another person toileting, which includes using toliet, bedpan, or urinal?: A Lot Help from another person bathing (including washing, rinsing, drying)?: A Lot Help from another person to put on and taking off regular upper body clothing?: A Little Help from another person to put on and taking off regular lower body clothing?: A Lot 6 Click Score: 16    End of Session Equipment Utilized During Treatment: Rolling walker (2 wheels)  OT Visit Diagnosis: Unsteadiness on feet (R26.81);Other abnormalities of gait and mobility (R26.89)   Activity Tolerance Patient tolerated treatment well   Patient Left in chair;with call bell/phone within reach;with chair alarm set   Nurse Communication Mobility status        Time: 8295-6213 OT Time Calculation (min): 35 min  Charges: OT General Charges $OT Visit: 1 Visit OT Treatments $Self Care/Home Management : 23-37  mins  Bradd Canary, OTR/L Acute Rehab Services Office: [phone]   Lorre Munroe 03/09/2023, 3:04 PM

## 2023-03-09 NOTE — Plan of Care (Signed)
Patient states he feels some better.

## 2023-03-09 NOTE — Plan of Care (Signed)

## 2023-03-10 ENCOUNTER — Other Ambulatory Visit (HOSPITAL_COMMUNITY): Payer: Self-pay

## 2023-03-10 ENCOUNTER — Telehealth (HOSPITAL_COMMUNITY): Payer: Self-pay | Admitting: Pharmacy Technician

## 2023-03-10 DIAGNOSIS — F10930 Alcohol use, unspecified with withdrawal, uncomplicated: Secondary | ICD-10-CM | POA: Diagnosis not present

## 2023-03-10 LAB — CBC WITH DIFFERENTIAL/PLATELET
Abs Immature Granulocytes: 0.56 10*3/uL — ABNORMAL HIGH (ref 0.00–0.07)
Basophils Absolute: 0.1 10*3/uL (ref 0.0–0.1)
Basophils Relative: 1 %
Eosinophils Absolute: 0.1 10*3/uL (ref 0.0–0.5)
Eosinophils Relative: 1 %
HCT: 30.4 % — ABNORMAL LOW (ref 39.0–52.0)
Hemoglobin: 10.6 g/dL — ABNORMAL LOW (ref 13.0–17.0)
Immature Granulocytes: 7 %
Lymphocytes Relative: 35 %
Lymphs Abs: 2.9 10*3/uL (ref 0.7–4.0)
MCH: 35.8 pg — ABNORMAL HIGH (ref 26.0–34.0)
MCHC: 34.9 g/dL (ref 30.0–36.0)
MCV: 102.7 fL — ABNORMAL HIGH (ref 80.0–100.0)
Monocytes Absolute: 0.4 10*3/uL (ref 0.1–1.0)
Monocytes Relative: 5 %
Neutro Abs: 4.2 10*3/uL (ref 1.7–7.7)
Neutrophils Relative %: 51 %
Platelets: 237 10*3/uL (ref 150–400)
RBC: 2.96 MIL/uL — ABNORMAL LOW (ref 4.22–5.81)
RDW: 21.3 % — ABNORMAL HIGH (ref 11.5–15.5)
WBC: 8.3 10*3/uL (ref 4.0–10.5)
nRBC: 0 % (ref 0.0–0.2)

## 2023-03-10 LAB — PROTIME-INR
INR: 0.9 (ref 0.8–1.2)
Prothrombin Time: 12.7 seconds (ref 11.4–15.2)

## 2023-03-10 LAB — COMPREHENSIVE METABOLIC PANEL
ALT: 127 U/L — ABNORMAL HIGH (ref 0–44)
AST: 153 U/L — ABNORMAL HIGH (ref 15–41)
Albumin: 2.3 g/dL — ABNORMAL LOW (ref 3.5–5.0)
Alkaline Phosphatase: 127 U/L — ABNORMAL HIGH (ref 38–126)
Anion gap: 8 (ref 5–15)
BUN: 17 mg/dL (ref 6–20)
CO2: 27 mmol/L (ref 22–32)
Calcium: 8.3 mg/dL — ABNORMAL LOW (ref 8.9–10.3)
Chloride: 99 mmol/L (ref 98–111)
Creatinine, Ser: 0.99 mg/dL (ref 0.61–1.24)
GFR, Estimated: >60 mL/min (ref >60)
Glucose, Bld: 128 mg/dL — ABNORMAL HIGH (ref 70–99)
Potassium: 3.7 mmol/L (ref 3.5–5.1)
Sodium: 134 mmol/L — ABNORMAL LOW (ref 135–145)
Total Bilirubin: 1.2 mg/dL (ref 0.3–1.2)
Total Protein: 5.1 g/dL — ABNORMAL LOW (ref 6.5–8.1)

## 2023-03-10 LAB — AMMONIA: Ammonia: 37 umol/L — ABNORMAL HIGH (ref 9–35)

## 2023-03-10 LAB — BRAIN NATRIURETIC PEPTIDE: B Natriuretic Peptide: 42.2 pg/mL (ref 0.0–100.0)

## 2023-03-10 LAB — MAGNESIUM: Magnesium: 2.1 mg/dL (ref 1.7–2.4)

## 2023-03-10 NOTE — Telephone Encounter (Signed)
Pharmacy Patient Advocate Encounter  Received notification from [facility] that Prior Authorization for Xifaxan 550MG  tablets  has been APPROVED from 03/10/2023 to 08/18/2023. Ran test claim, Copay is $1,083.83.  PA #/Case ID/Reference #: [id number]

## 2023-03-10 NOTE — Plan of Care (Signed)
Patient feeling better and not as weak.

## 2023-03-10 NOTE — TOC Progression Note (Addendum)
Transition of Care [facility]) - Progression Note    Patient Details  Name: Ramzy Cappelletti MRN: [id number] Date of Birth: 1971-06-27  Transition of Care [facility]) CM/SW Contact  Mearl Latin, LCSW Phone Number: 03/10/2023, 5:37 PM  Clinical Narrative:    5:37pm-[facility] able to accept patient. CSW will initiate insurance process once updated PT note is in.  6:17 PM-CSW submitted clinicals to insurance for review, Ref# [id number].   Expected Discharge Plan: Skilled Nursing Facility Barriers to Discharge: Continued Medical Work up  Expected Discharge Plan and Services     Post Acute Care Choice: Skilled Nursing Facility Living arrangements for the past 2 months: Single Family Home                                       Social Determinants of Health (SDOH) Interventions SDOH Screenings   Food Insecurity: No Food Insecurity (10/18/2019)  Transportation Needs: Unmet Transportation Needs (01/13/2022)  Utilities: Not At Risk (10/06/2022)  Alcohol Screen: Medium Risk (06/02/2018)  Depression (PHQ2-9): Medium Risk (01/15/2022)  Tobacco Use: High Risk (03/04/2023)    Readmission Risk Interventions    09/19/2021   12:21 PM  Readmission Risk Prevention Plan  HRI or Home Care Consult Complete  Social Work Consult for Recovery Care Planning/Counseling Complete  Palliative Care Screening Not Applicable

## 2023-03-10 NOTE — [place] FL2 (Signed)
[place] MEDICAID FL2 LEVEL OF CARE FORM     IDENTIFICATION  Patient Name: [name] Birthdate: Oct 21, 1970 Sex: male Admission Date (Current Location): 03/04/2023  [facility] and [place] Number:  Producer, television/film/video and Address:  [address] [facility], [address], [place], [place] [address]      Provider Number: [id number]  Attending Physician Name and Address:  Leroy Sea, MD  Relative Name and Phone Number:       Current Level of Care: Hospital Recommended Level of Care: Skilled Nursing Facility Prior Approval Number:    Date Approved/Denied:   PASRR Number: [id number] E expires 04/09/23  Discharge Plan: SNF    Current Diagnoses: Patient Active Problem List   Diagnosis Date Noted   Hypervolemic hyponatremia 03/05/2023   Hypocalcemia 03/05/2023   Hepatic cirrhosis (HCC) 03/05/2023   History of seizure 03/05/2023   Leukocytosis 03/05/2023   Chronic anemia 03/05/2023   Schizoaffective disorder (HCC) 03/05/2023   Transaminitis 03/05/2023   Elevated bilirubin 03/05/2023   Hypochloremia 03/05/2023   Alcohol withdrawal (HCC) 02/02/2022   Hyperammonemia (HCC) 02/02/2022   Alcohol withdrawal syndrome without complication (HCC) 01/10/2022   Acute gout 01/10/2022   Elevated liver function tests    Nausea and vomiting    Trigger finger, right middle finger 05/20/2021   Alcoholic cirrhosis of liver with ascites (HCC) 03/08/2021   Polysubstance abuse (HCC) 03/08/2021   Secondary esophageal varices without bleeding (HCC) 03/08/2021   Macrocytic anemia    Hepatic steatosis    Hypomagnesemia 05/03/2020   Hyperbilirubinemia 05/03/2020   Alcoholism (HCC) 05/03/2020   Alcoholic hepatitis without ascites 03/06/2019   Essential hypertension 01/10/2019   Alcohol intoxication (HCC) 01/10/2019   Schizoaffective disorder, bipolar type (HCC) 06/02/2018   Alcohol-induced mood disorder (HCC) 02/22/2016   Gout 10/22/2015   GERD (gastroesophageal reflux  disease) 10/22/2015   Schizoaffective disorder, depressive type (HCC)    History of alcohol abuse    Psychosis (HCC) 05/13/2015   Tobacco dependence due to cigarettes 11/20/2014   Abnormal LFTs    Alcohol abuse 02/09/2014   PTSD (post-traumatic stress disorder) 11/03/2011   Hypokalemia 07/02/2011    Orientation RESPIRATION BLADDER Height & Weight     Situation, Time, Self, Place  Normal Incontinent Weight: 220 lb (99.8 kg) Height:  6' (182.9 cm)  BEHAVIORAL SYMPTOMS/MOOD NEUROLOGICAL BOWEL NUTRITION STATUS      Continent Diet (see dc summary)  AMBULATORY STATUS COMMUNICATION OF NEEDS Skin   Limited Assist Verbally Normal                       Personal Care Assistance Level of Assistance  Dressing Bathing Assistance: Limited assistance   Dressing Assistance: Limited assistance     Functional Limitations Info             SPECIAL CARE FACTORS FREQUENCY  PT (By licensed PT), OT (By licensed OT)     PT Frequency: 3 to 5 times a week OT Frequency: 3 to 5 times a week            Contractures      Additional Factors Info  Code Status, Allergies Code Status Info: Full Allergies Info: Bupropion           Current Medications (03/10/2023):  This is the current hospital active medication list Current Facility-Administered Medications  Medication Dose Route Frequency Provider Last Rate Last Admin   acetaminophen (TYLENOL) tablet 650 mg  650 mg Oral Q6H PRN Tereasa Coop, MD  chlordiazePOXIDE (LIBRIUM) capsule 20 mg  20 mg Oral TID Leroy Sea, MD   20 mg at 03/10/23 0913   colchicine tablet 0.3 mg  0.3 mg Oral BID Leroy Sea, MD   0.3 mg at 03/10/23 0602   enoxaparin (LOVENOX) injection 40 mg  40 mg Subcutaneous Daily Sundil, Subrina, MD   40 mg at 03/09/23 1436   folic acid (FOLVITE) tablet 1 mg  1 mg Oral Daily Sundil, Subrina, MD   1 mg at 03/10/23 0912   gabapentin (NEURONTIN) capsule 300 mg  300 mg Oral TID Tyrone Nine, MD   300 mg at  03/10/23 0913   lactulose (CHRONULAC) 10 GM/15ML solution 30 g  30 g Oral TID Leroy Sea, MD   30 g at 03/10/23 0912   levETIRAcetam (KEPPRA) IVPB 500 mg/100 mL premix  500 mg Intravenous BID Sundil, Subrina, MD 400 mL/hr at 03/10/23 0916 500 mg at 03/10/23 0916   LORazepam (ATIVAN) tablet 1-4 mg  1-4 mg Oral Q1H PRN Leroy Sea, MD   1 mg at 03/08/23 1124   Or   LORazepam (ATIVAN) injection 1-4 mg  1-4 mg Intravenous Q1H PRN Leroy Sea, MD       metoprolol succinate (TOPROL-XL) 24 hr tablet 25 mg  25 mg Oral Daily Tyrone Nine, MD   25 mg at 03/10/23 0913   multivitamin with minerals tablet 1 tablet  1 tablet Oral Daily Sundil, Subrina, MD   1 tablet at 03/10/23 0913   nicotine (NICODERM CQ - dosed in mg/24 hours) patch 21 mg  21 mg Transdermal Daily Carollee Herter, DO   21 mg at 03/10/23 0913   ondansetron (ZOFRAN) injection 4 mg  4 mg Intravenous Q6H PRN Sundil, Subrina, MD   4 mg at 03/05/23 0631   pantoprazole (PROTONIX) EC tablet 40 mg  40 mg Oral Daily Leroy Sea, MD   40 mg at 03/10/23 0912   QUEtiapine (SEROQUEL) tablet 200 mg  200 mg Oral QHS Hazeline Junker B, MD   200 mg at 03/09/23 2217   rifaximin (XIFAXAN) tablet 550 mg  550 mg Oral BID Leroy Sea, MD   550 mg at 03/10/23 0913   thiamine (VITAMIN B1) tablet 100 mg  100 mg Oral Daily Sundil, Subrina, MD   100 mg at 03/10/23 4782   Or   thiamine (VITAMIN B1) injection 100 mg  100 mg Intravenous Daily Sundil, Subrina, MD       traMADol Janean Sark) tablet 50 mg  50 mg Oral Q6H PRN Leroy Sea, MD   50 mg at 03/07/23 1013     Discharge Medications: Please see discharge summary for a list of discharge medications.  Relevant Imaging Results:  Relevant Lab Results:   Additional Information SS#: [id number]  Renne Crigler Maeson Lourenco, LCSW

## 2023-03-10 NOTE — Telephone Encounter (Signed)
Pharmacy Patient Advocate Encounter   Received notification that prior authorization for Xifaxan 550MG  tablets is required/requested.   Insurance verification completed.   The patient is insured through [facility] .   Per test claim: PA submitted to [facility] via CoverMyMeds Key/confirmation #/EOC [id number] Status is pending

## 2023-03-10 NOTE — Progress Notes (Addendum)
Physical Therapy Treatment Patient Details Name: [name] MRN: [id number] DOB: 23-Jun-1971 Today's Date: 03/10/2023   History of Present Illness Pt is a 52 y/o male admitted for hyponatremia, hypokalemia and other electrolyte derangements in setting of etoh abuse. PMH: seizures, gout, neuropathy, schizoaffective disorder.    PT Comments  Pt received in supine, agreeable to therapy session and with good participation and tolerance for transfer and gait training. Pt needing up to min guard for safety with transfers/gait with RW support. VSS on RA. Pt remains below functional baseline, continue to recommend low intensity post-acute rehab. Pt continues to benefit from PT services to progress toward functional mobility goals.     Assistance Recommended at Discharge Frequent or constant Supervision/Assistance  If plan is discharge home, recommend the following:  Can travel by private vehicle    A little help with walking and/or transfers;Assistance with cooking/housework;Direct supervision/assist for medications management;Direct supervision/assist for financial management;Assist for transportation;Help with stairs or ramp for entrance   Yes  Equipment Recommendations  None recommended by PT    Recommendations for Other Services       Precautions / Restrictions Precautions Precautions: Fall Restrictions Weight Bearing Restrictions: No     Mobility  Bed Mobility Overal bed mobility: Modified Independent             General bed mobility comments: increased time/effort    Transfers Overall transfer level: Needs assistance Equipment used: Rolling walker (2 wheels) Transfers: Sit to/from Stand Sit to Stand: Min guard           General transfer comment: from EOB<>RW    Ambulation/Gait Ambulation/Gait assistance: Min guard Gait Distance (Feet): 150 Feet Assistive device: Rolling walker (2 wheels) Gait Pattern/deviations: Step-through pattern, Decreased stride  length, Shuffle, Trunk flexed Gait velocity: grossly <0.4 m/s     General Gait Details: Heavy reliance on Bil UEs, decreased bil foot clearance, pt c/o shakiness increased toward end of trial but no overt buckling; HR/SpO2 WFL on RA throughout      Balance Overall balance assessment: Needs assistance Sitting-balance support: No upper extremity supported, Feet supported Sitting balance-Leahy Scale: Good     Standing balance support: Bilateral upper extremity supported, Reliant on assistive device for balance Standing balance-Leahy Scale: Poor Standing balance comment: heavy reliance on RW                            Cognition Arousal/Alertness: Awake/alert Behavior During Therapy: Flat affect Overall Cognitive Status: No family/caregiver present to determine baseline cognitive functioning                                 General Comments: Pleasant, follows one step commands though some decreased insight into deficits        Exercises Other Exercises Other Exercises: Verbal cues for supine/seated LE exercises, pt c/o fatigue.    General Comments General comments (skin integrity, edema, etc.): VSS on RA      Pertinent Vitals/Pain Pain Assessment Pain Assessment: Faces Faces Pain Scale: Hurts a little bit Pain Location: pt did not localize Pain Descriptors / Indicators: Grimacing Pain Intervention(s): Monitored during session, Repositioned     PT Goals (current goals can now be found in the care plan section) Acute Rehab PT Goals Patient Stated Goal: get well and go home PT Goal Formulation: With patient Time For Goal Achievement: 03/21/23 Progress towards PT goals: Progressing toward goals  Frequency    Min 2X/week      PT Plan Current plan remains appropriate       AM-PAC PT "6 Clicks" Mobility   Outcome Measure  Help needed turning from your back to your side while in a flat bed without using bedrails?: A Little Help needed  moving from lying on your back to sitting on the side of a flat bed without using bedrails?: A Little Help needed moving to and from a bed to a chair (including a wheelchair)?: A Little Help needed standing up from a chair using your arms (e.g., wheelchair or bedside chair)?: A Little Help needed to walk in hospital room?: A Little Help needed climbing 3-5 steps with a railing? : A Lot 6 Click Score: 17    End of Session Equipment Utilized During Treatment: Gait belt Activity Tolerance: Patient tolerated treatment well Patient left: in bed;with call bell/phone within reach;with bed alarm set Nurse Communication: Mobility status PT Visit Diagnosis: Unsteadiness on feet (R26.81);Other abnormalities of gait and mobility (R26.89);Muscle weakness (generalized) (M62.81)     Time: 1710-1735 PT Time Calculation (min) (ACUTE ONLY): 25 min  Charges:    $Gait Training: 8-22 mins $Therapeutic Activity: 8-22 mins PT General Charges $$ ACUTE PT VISIT: 1 Visit                     Ryan Dorko P., PTA Acute Rehabilitation Services Secure Chat Preferred 9a-5:30pm Office: [phone]    [name] [place] [facility] 03/10/2023, 5:52 PM

## 2023-03-10 NOTE — TOC Benefit Eligibility Note (Signed)
Pharmacy Patient Advocate Encounter  Insurance verification completed.    The patient is insured through [facility] Medicare Part D  Ran test claim for Xifaxan 550 mg and Requires Prior Authorization   This test claim was processed through [facility]- copay amounts may vary at other pharmacies due to [employer], or as the patient moves through the different stages of their insurance plan.    Roland Earl, CPHT Pharmacy Patient Advocate Specialist [facility] Patient Advocate Team Direct Number: [phone]  Fax: [phone]

## 2023-03-10 NOTE — Progress Notes (Signed)
PROGRESS NOTE                                                                                                                                                                                                             Patient Demographics:    [name], is a 52 y.o. male, DOB - [birth year], QMV:[id number]  Outpatient Primary MD for the patient is Georganna Skeans, MD    LOS - 5  Admit date - 03/04/2023    Chief Complaint  Patient presents with   Alcohol Intoxication       Brief Narrative (HPI from H&P)   52 y.o. male with a history of alcohol abuse, withdrawal-related seizure, alcoholic cirrhosis, schizoaffective disorder, GERD who presented to the ED on 03/04/2023 by EMS due to generally feeling unwell, weakness, poor oral intake, and abdominal distention/discomfort. He was found to have many severe electrolyte derangements, early alcohol withdrawal symptoms, he was found to be profoundly hyponatremic, hypokalemic as well.    Subjective:   Patient in bed, appears comfortable, denies any headache, no fever, no chest pain or pressure, no shortness of breath , no abdominal pain. No focal weakness.   Assessment  & Plan :   Dehydration - Hyponatremia (relative access of free water) Hypokalemia, hypochloremia , Hypocalcemia -  Renal following, has been hydrated with IV fluids, DW Dr Marisue Humble 03/06/23, electrolytes replaced, restrict free water, monitor.  Much improved.  Alcohol abuse - DTs - counseled, CIWA + librium.  History of seizure in setting of alcohol withdrawal: Maintained on chronic keppra.   Schizoaffective disorder:  Hold SSRI in setting of severe hyponatremia currently,  Continue seroquel at bedtime, Psych input.  Fall at home: R knee XR negative.  PT evaluation.  Fall precautions.  Gout: Acute on chronic.  Uric acid level stable, hold allopurinol, colchicine and steroids x2.  Peripheral neuropathy: Continue  gabapentin to avoid withdrawal.  HTN: Continue home metoprolol.   Blurry vision in both eyes for the last 3 to 4 weeks.  Outpatient ophthalmology follow-up, CT head unremarkable x 2.  Stable.  Asymptomatic transaminitis.  Likely due to alcohol abuse, bilirubin stable, INR levels good suggesting good synthetic function, albumin low due to poor oral intake, LFT trend improving, it upper quadrant ultrasound suggest fatty liver, ammonia is borderline has been discharged on lactulose and Xifaxan.  Condition - Extremely Guarded  Family Communication  :  None  Code Status :  Full  Consults  :  Renal, Psych  PUD Prophylaxis : PPI   Procedures  :     CT head x 2 - stable  Abd [place] - no ascites, fatty liver      Disposition Plan  :    Status is: Inpatient   DVT Prophylaxis  :    Place TED hose Start: 03/07/23 0826 enoxaparin (LOVENOX) injection 40 mg Start: 03/05/23 0600 SCDs Start: 03/05/23 1610    Lab Results  Component Value Date   PLT 237 03/10/2023    Diet :  Diet Order             Diet regular Room service appropriate? Yes; Fluid consistency: Thin  Diet effective now                    Inpatient Medications  Scheduled Meds:  chlordiazePOXIDE  20 mg Oral TID   colchicine  0.3 mg Oral BID   enoxaparin (LOVENOX) injection  40 mg Subcutaneous Daily   folic acid  1 mg Oral Daily   gabapentin  300 mg Oral TID   lactulose  30 g Oral TID   metoprolol succinate  25 mg Oral Daily   multivitamin with minerals  1 tablet Oral Daily   nicotine  21 mg Transdermal Daily   pantoprazole  40 mg Oral Daily   QUEtiapine  200 mg Oral QHS   rifaximin  550 mg Oral BID   thiamine  100 mg Oral Daily   Or   thiamine  100 mg Intravenous Daily   Continuous Infusions:  levETIRAcetam 500 mg (03/09/23 1747)   PRN Meds:.acetaminophen, LORazepam **OR** LORazepam, ondansetron (ZOFRAN) IV, traMADol  Antibiotics  :    Anti-infectives (From admission, onward)     Start     Dose/Rate Route Frequency Ordered Stop   03/09/23 1000  rifaximin (XIFAXAN) tablet 550 mg        550 mg Oral 2 times daily 03/09/23 0557           Objective:   Vitals:   03/09/23 2010 03/09/23 2339 03/10/23 0400 03/10/23 0806  BP: 113/75 99/66 95/74  103/81  Pulse:   92   Resp:  18 18   Temp: 97.9 F (36.6 C) 98.1 F (36.7 C) 98 F (36.7 C) 97.6 F (36.4 C)  TempSrc: Oral Oral Oral Oral  SpO2:   93%   Weight:      Height:        Wt Readings from Last 3 Encounters:  03/05/23 99.8 kg  09/15/22 106.1 kg  07/30/22 107.4 kg     Intake/Output Summary (Last 24 hours) at 03/10/2023 0859 Last data filed at 03/10/2023 0600 Gross per 24 hour  Intake 1460 ml  Output 3200 ml  Net -1740 ml     Physical Exam  Awake Alert, No new F.N deficits, mild Dts [place].AT,PERRAL Supple Neck, No JVD,   Symmetrical Chest wall movement, Good air movement bilaterally, CTAB RRR,No Gallops,Rubs or new Murmurs,  +ve B.Sounds, Abd Soft, No tenderness,   No Cyanosis, Clubbing or edema      Data Review:    Recent Labs  Lab 03/06/23 0535 03/07/23 0304 03/08/23 0652 03/09/23 0330 03/10/23 0249  WBC 6.0 10.9* 11.2* 9.3 8.3  HGB 11.5* 10.6* 10.4* 10.8* 10.6*  HCT 31.7* 30.0* 30.2* 31.8* 30.4*  PLT 186 216 233 261 237  MCV 96.1  98.0 101.3* 100.6* 102.7*  MCH 34.8* 34.6* 34.9* 34.2* 35.8*  MCHC 36.3* 35.3 34.4 34.0 34.9  RDW 18.6* 18.9* 19.3* 20.0* 21.3*  LYMPHSABS  --  1.5 2.9 3.0 2.9  MONOABS  --  0.6 0.6 0.4 0.4  EOSABS  --  0.0 0.1 0.1 0.1  BASOSABS  --  0.0 0.0 0.1 0.1    Recent Labs  Lab 03/05/23 0034 03/05/23 0122 03/05/23 0345 03/05/23 0448 03/05/23 1005 03/06/23 0535 03/06/23 0630 03/06/23 0730 03/06/23 0908 03/06/23 1455 03/07/23 0304 03/08/23 0652 03/09/23 0330 03/10/23 0249  NA 115*  --    < > 121*   < >  --    < >  --  138 130* 128* 131* 132* 134*  K <2.0*  --    < > <2.0*   < >  --    < >  --  3.6 2.9* 3.9 3.6 3.7 3.7  CL <65*  --    < > <65*   < >   --    < >  --  98 77* 87* 93* 94* 99  CO2 31  --    < > 31   < >  --    < >  --  30 37* 29 31 26 27   ANIONGAP NOT CALCULATED  --    < > NOT CALCULATED   < >  --    < >  --  10 16* 12 7 12 8   GLUCOSE 139*  --    < > 137*   < >  --    < >  --  80 160* 138* 96 138* 128*  BUN 13  --    < > 14   < >  --    < >  --  <5* 12 10 12 18 17   CREATININE 1.03  --    < > 0.98   < >  --    < >  --  1.23 1.14 0.84 0.84 1.04 0.99  AST 150*  --   --  161*  --   --   --   --   --   --  248* 175* 168* 153*  ALT 86*  --   --  93*  --   --   --   --   --   --  127* 125* 128* 127*  ALKPHOS 117  --   --  136*  --   --   --   --   --   --  120 119 129* 127*  BILITOT 2.2*  --   --  2.3*  --   --   --   --   --   --  3.5* 1.3* 1.3* 1.2  ALBUMIN 2.9*  --   --  3.3*   < >  --    < >  --  2.0*  --  2.4* 2.3* 2.3* 2.3*  INR  --  1.1  --   --   --   --   --   --   --   --   --   --   --  0.9  AMMONIA 43*  --   --   --   --   --   --   --   --   --   --   --  55* 37*  BNP  --   --   --   --   --   --   --  98.8  --   --  106.2* 249.7* 102.5* 42.2  MG  --  0.8*   < >  --   --  1.7  --   --   --   --  2.0 1.7 1.6* 2.1  CALCIUM 5.1*  --    < > 6.4*  6.3*   < >  --    < >  --  7.9* 7.8* 7.7* 8.0* 8.0* 8.3*   < > = values in this interval not displayed.      Radiology Reports US Abdomen Limited RUQ (LIVER/GB)  Result Date: 03/09/2023 CLINICAL DATA:  Transaminitis EXAM: ULTRASOUND ABDOMEN LIMITED RIGHT UPPER QUADRANT COMPARISON:  03/05/2023. FINDINGS: Gallbladder: No gallstones or wall thickening visualized. No sonographic Murphy sign noted by sonographer. Common bile duct: Diameter: 4 mm Liver: Diffusely echogenic hepatic parenchyma. With this level of echogenicity, evaluation for underlying mass lesion is limited and if needed further workup with contrast CT or MRI as clinically appropriate. Portal vein is patent on color Doppler imaging with normal direction of blood flow towards the liver. Other: None. IMPRESSION: Fatty liver  infiltration. No biliary ductal dilatation. No gallstones. Electronically Signed   By: Karen Kays M.D.   On: 03/09/2023 17:32   CT HEAD WO CONTRAST ( )  Result Date: 03/07/2023 CLINICAL DATA:  Mental status change, unknown cause EXAM: CT HEAD WITHOUT CONTRAST TECHNIQUE: Contiguous axial images were obtained from the base of the skull through the vertex without intravenous contrast. RADIATION DOSE REDUCTION: This exam was performed according to the departmental dose-optimization program which includes automated exposure control, adjustment of the mA and/or kV according to patient size and/or use of iterative reconstruction technique. COMPARISON:  03/05/2023 FINDINGS: Brain: Mild age advanced cerebral atrophy. No acute intracranial abnormality. Specifically, no hemorrhage, hydrocephalus, mass lesion, acute infarction, or significant intracranial injury. Vascular: No hyperdense vessel or unexpected calcification. Skull: No acute calvarial abnormality. Sinuses/Orbits: No acute findings Other: None IMPRESSION: Mild age advanced atrophy. No acute intracranial abnormality. Electronically Signed   By: Charlett Nose M.D.   On: 03/07/2023 19:20      Signature  -   Susa Raring M.D on 03/10/2023 at 8:59 AM   -  To page go to [email or web address]

## 2023-03-10 NOTE — Plan of Care (Signed)

## 2023-03-11 DIAGNOSIS — F1010 Alcohol abuse, uncomplicated: Secondary | ICD-10-CM | POA: Diagnosis not present

## 2023-03-11 DIAGNOSIS — K7030 Alcoholic cirrhosis of liver without ascites: Secondary | ICD-10-CM | POA: Diagnosis not present

## 2023-03-11 LAB — COMPREHENSIVE METABOLIC PANEL
ALT: 114 U/L — ABNORMAL HIGH (ref 0–44)
AST: 127 U/L — ABNORMAL HIGH (ref 15–41)
Albumin: 2.3 g/dL — ABNORMAL LOW (ref 3.5–5.0)
Alkaline Phosphatase: 121 U/L (ref 38–126)
Anion gap: 9 (ref 5–15)
BUN: 16 mg/dL (ref 6–20)
CO2: 27 mmol/L (ref 22–32)
Calcium: 8.9 mg/dL (ref 8.9–10.3)
Chloride: 98 mmol/L (ref 98–111)
Creatinine, Ser: 1.23 mg/dL (ref 0.61–1.24)
GFR, Estimated: >60 mL/min (ref >60)
Glucose, Bld: 107 mg/dL — ABNORMAL HIGH (ref 70–99)
Potassium: 4.3 mmol/L (ref 3.5–5.1)
Sodium: 134 mmol/L — ABNORMAL LOW (ref 135–145)
Total Bilirubin: 0.8 mg/dL (ref 0.3–1.2)
Total Protein: 5.1 g/dL — ABNORMAL LOW (ref 6.5–8.1)

## 2023-03-11 LAB — CBC WITH DIFFERENTIAL/PLATELET
Abs Immature Granulocytes: 0.46 10*3/uL — ABNORMAL HIGH (ref 0.00–0.07)
Basophils Absolute: 0 10*3/uL (ref 0.0–0.1)
Basophils Relative: 1 %
Eosinophils Absolute: 0.1 10*3/uL (ref 0.0–0.5)
Eosinophils Relative: 1 %
HCT: 30.1 % — ABNORMAL LOW (ref 39.0–52.0)
Hemoglobin: 10.1 g/dL — ABNORMAL LOW (ref 13.0–17.0)
Immature Granulocytes: 6 %
Lymphocytes Relative: 34 %
Lymphs Abs: 2.6 10*3/uL (ref 0.7–4.0)
MCH: 34.5 pg — ABNORMAL HIGH (ref 26.0–34.0)
MCHC: 33.6 g/dL (ref 30.0–36.0)
MCV: 102.7 fL — ABNORMAL HIGH (ref 80.0–100.0)
Monocytes Absolute: 0.6 10*3/uL (ref 0.1–1.0)
Monocytes Relative: 7 %
Neutro Abs: 4 10*3/uL (ref 1.7–7.7)
Neutrophils Relative %: 51 %
Platelets: 241 10*3/uL (ref 150–400)
RBC: 2.93 MIL/uL — ABNORMAL LOW (ref 4.22–5.81)
RDW: 21.9 % — ABNORMAL HIGH (ref 11.5–15.5)
WBC: 7.7 10*3/uL (ref 4.0–10.5)
nRBC: 0 % (ref 0.0–0.2)

## 2023-03-11 LAB — VITAMIN B12: Vitamin B-12: 815 pg/mL (ref 180–914)

## 2023-03-11 LAB — MAGNESIUM: Magnesium: 1.8 mg/dL (ref 1.7–2.4)

## 2023-03-11 LAB — PHOSPHORUS: Phosphorus: 5.5 mg/dL — ABNORMAL HIGH (ref 2.5–4.6)

## 2023-03-11 LAB — IRON AND TIBC
Iron: 103 ug/dL (ref 45–182)
Saturation Ratios: 45 % — ABNORMAL HIGH (ref 17.9–39.5)
TIBC: 231 ug/dL — ABNORMAL LOW (ref 250–450)
UIBC: 128 ug/dL

## 2023-03-11 LAB — FOLATE: Folate: 6.4 ng/mL (ref >5.9)

## 2023-03-11 MED ORDER — LEVETIRACETAM 500 MG PO TABS
500.0000 mg | ORAL_TABLET | Freq: Two times a day (BID) | ORAL | Status: DC
  Administered 2023-03-11 – 2023-03-12 (×2): 500 mg via ORAL
  Filled 2023-03-11 (×2): qty 1

## 2023-03-11 MED ORDER — VITAMIN D (ERGOCALCIFEROL) 1.25 MG (50000 UNIT) PO CAPS
50000.0000 [IU] | ORAL_CAPSULE | ORAL | Status: DC
  Administered 2023-03-11: 50000 [IU] via ORAL
  Filled 2023-03-11: qty 1

## 2023-03-11 NOTE — Plan of Care (Signed)
Patient states he feels stronger and better

## 2023-03-11 NOTE — TOC Progression Note (Signed)
Transition of Care [facility]) - Progression Note    Patient Details  Name: [name] MRN: [id number] Date of Birth: 12-29-1970  Transition of Care [facility]) CM/SW Contact  Mearl Latin, LCSW Phone Number: 03/11/2023, 1:59 PM  Clinical Narrative:    Per insurance reviewer, SNF case is going to their Wellsite geologist.    Expected Discharge Plan: Skilled Nursing Facility Barriers to Discharge: Continued Medical Work up  Expected Discharge Plan and Services     Post Acute Care Choice: Skilled Nursing Facility Living arrangements for the past 2 months: Single Family Home                                       Social Determinants of Health (SDOH) Interventions SDOH Screenings   Food Insecurity: No Food Insecurity (10/18/2019)  Transportation Needs: Unmet Transportation Needs (01/13/2022)  Utilities: Not At Risk (10/06/2022)  Alcohol Screen: Medium Risk (06/02/2018)  Depression (PHQ2-9): Medium Risk (01/15/2022)  Tobacco Use: High Risk (03/04/2023)    Readmission Risk Interventions    09/19/2021   12:21 PM  Readmission Risk Prevention Plan  HRI or Home Care Consult Complete  Social Work Consult for Recovery Care Planning/Counseling Complete  Palliative Care Screening Not Applicable

## 2023-03-11 NOTE — Progress Notes (Signed)
Occupational Therapy Treatment Patient Details Name: [name] MRN: [id number] DOB: 1970-11-09 Today's Date: 03/11/2023   History of present illness Pt is a 52 y/o male admitted for hyponatremia, hypokalemia and other electrolyte derangements in setting of etoh abuse. PMH: seizures, gout, neuropathy, schizoaffective disorder.   OT comments  Pt dons socks in long sitting prior to getting EOB on his own. Remains shaky and dependent on RW with up to min assist for OOB. Pt requires moderate assistance for toileting. Stood at sink for one grooming activity with min assist. Patient will benefit from continued inpatient follow up therapy, <3 hours/day. His mother is unable to assist him at home.    Recommendations for follow up therapy are one component of a multi-disciplinary discharge planning process, led by the attending physician.  Recommendations may be updated based on patient status, additional functional criteria and insurance authorization.    Assistance Recommended at Discharge Intermittent Supervision/Assistance  Patient can return home with the following  A little help with walking and/or transfers;A lot of help with bathing/dressing/bathroom;Assistance with cooking/housework;Direct supervision/assist for financial management;Assist for transportation;Help with stairs or ramp for entrance   Equipment Recommendations       Recommendations for Other Services      Precautions / Restrictions Precautions Precautions: Fall Restrictions Weight Bearing Restrictions: No       Mobility Bed Mobility Overal bed mobility: Modified Independent                  Transfers Overall transfer level: Needs assistance Equipment used: Rolling walker (2 wheels) Transfers: Sit to/from Stand Sit to Stand: Min guard           General transfer comment: slow to rise     Balance Overall balance assessment: Needs assistance   Sitting balance-Leahy Scale: Good     Standing  balance support: Bilateral upper extremity supported, Reliant on assistive device for balance Standing balance-Leahy Scale: Poor                             ADL either performed or assessed with clinical judgement   ADL Overall ADL's : Needs assistance/impaired     Grooming: Wash/dry hands;Standing;Minimal assistance           Upper Body Dressing : Set up;Sitting   Lower Body Dressing: Set up;Bed level Lower Body Dressing Details (indicate cue type and reason): long sits to don socks Toilet Transfer: Ambulation;Rolling walker (2 wheels);Minimal assistance   Toileting- Clothing Manipulation and Hygiene: Moderate assistance;Sitting/lateral lean;Bed level       Functional mobility during ADLs: Rolling walker (2 wheels);Minimal assistance      Extremity/Trunk Assessment              Vision       Perception     Praxis      Cognition Arousal/Alertness: Awake/alert Behavior During Therapy: Flat affect Overall Cognitive Status: No family/caregiver present to determine baseline cognitive functioning                                          Exercises      Shoulder Instructions       General Comments      Pertinent Vitals/ Pain       Pain Assessment Pain Assessment: Faces Faces Pain Scale: Hurts a little bit Pain Location: generalized Pain Descriptors / Indicators:  Grimacing Pain Intervention(s): Monitored during session, Repositioned  Home Living                                          Prior Functioning/Environment              Frequency  Min 1X/week        Progress Toward Goals  OT Goals(current goals can now be found in the care plan section)  Progress towards OT goals: Progressing toward goals  Acute Rehab OT Goals OT Goal Formulation: With patient Time For Goal Achievement: 03/20/23 Potential to Achieve Goals: Good  Plan Discharge plan remains appropriate    Co-evaluation                  AM-PAC OT "6 Clicks" Daily Activity     Outcome Measure   Help from another person eating meals?: None Help from another person taking care of personal grooming?: A Little Help from another person toileting, which includes using toliet, bedpan, or urinal?: A Lot Help from another person bathing (including washing, rinsing, drying)?: A Lot Help from another person to put on and taking off regular upper body clothing?: A Little Help from another person to put on and taking off regular lower body clothing?: A Lot 6 Click Score: 16    End of Session Equipment Utilized During Treatment: Rolling walker (2 wheels);Gait belt  OT Visit Diagnosis: Unsteadiness on feet (R26.81);Other abnormalities of gait and mobility (R26.89)   Activity Tolerance Patient limited by fatigue   Patient Left in bed;with call bell/phone within reach   Nurse Communication          Time: 1529-1550 OT Time Calculation (min): 21 min  Charges: OT General Charges $OT Visit: 1 Visit OT Treatments $Self Care/Home Management : 8-22 mins  Berna Spare, OTR/L Acute Rehabilitation Services Office: [phone]  Evern Bio 03/11/2023, 3:58 PM

## 2023-03-11 NOTE — Plan of Care (Signed)

## 2023-03-11 NOTE — Progress Notes (Signed)
[facility] Progress Note  Patient: [name]    BJY:[id number]  DOA: 03/04/2023     Date of Service: the patient was seen and examined on 03/11/2023  Chief Complaint  Patient presents with   Alcohol Intoxication   Brief hospital course:  Brief Narrative (HPI from H&P)   52 y.o. male with a history of alcohol abuse, withdrawal-related seizure, alcoholic cirrhosis, schizoaffective disorder, GERD who presented to the ED on 03/04/2023 by EMS due to generally feeling unwell, weakness, poor oral intake, and abdominal distention/discomfort. He was found to have many severe electrolyte derangements, early alcohol withdrawal symptoms, he was found to be profoundly hyponatremic, hypokalemic as well.    Assessment and Plan: Dehydration - Hyponatremia (relative access of free water) Hypokalemia, hypochloremia , Hypocalcemia -  Renal following, has been hydrated with IV fluids, DW Dr Marisue Humble 03/06/23, electrolytes replaced, restrict free water, monitor.  Much improved.   Alcohol abuse - DTs - counseled, CIWA + librium.   History of seizure in setting of alcohol withdrawal: Maintained on chronic keppra.    Schizoaffective disorder:  Hold SSRI in setting of severe hyponatremia currently,  Continue seroquel at bedtime, Psych input.   Fall at home: R knee XR negative.  PT evaluation.  Fall precautions.   Gout: Acute on chronic.  Uric acid level stable, hold allopurinol, colchicine and steroids x2.   Peripheral neuropathy: Continue gabapentin to avoid withdrawal.   HTN: Continue home metoprolol.    Blurry vision in both eyes for the last 3 to 4 weeks.  Outpatient ophthalmology follow-up, CT head unremarkable x 2.  Stable.   Asymptomatic transaminitis.  Likely due to alcohol abuse, bilirubin stable, INR levels good suggesting good synthetic function, albumin low due to poor oral intake, LFT trend improving, it upper quadrant ultrasound suggest fatty liver, ammonia is borderline has been  discharged on lactulose and Xifaxan.  Vitamin D deficiency: started vitamin D 50,000 units p.o. weekly, follow with PCP to repeat vitamin D level after 3 to 6 months.  Body mass index is 29.84 kg/m.  Interventions:  Diet: Regular diet DVT Prophylaxis: Subcutaneous Lovenox   Advance goals of care discussion: Full code  Family Communication: family was not present at bedside, at the time of interview.  The pt provided permission to discuss medical plan with the family. Opportunity was given to ask question and all questions were answered satisfactorily.   Disposition:  Pt is from Home, admitted with EtOH abuse with withdrawal symptoms and hyponatremia, medically optimized and clinically stable to discharge to SNF when bed will be available.  TOC is following for disposition plan.  Subjective: No significant events overnight, patient is complaining of mild shakiness and complaining of right upper quadrant tenderness, stated that he fell and after that it is hurting, and right any swelling around but on the lateral side seems hematoma after the trauma. Musculoskeletal tenderness, patient was reassured.  Physical Exam: General: NAD, lying comfortably Appear in no distress, affect appropriate Eyes: PERRLA ENT: Oral Mucosa Clear, moist  Neck: no JVD,  Cardiovascular: S1 and S2 Present, no Murmur,  Respiratory: good respiratory effort, Bilateral Air entry equal and Decreased, no Crackles, no wheezes Abdomen: Bowel Sound present, Soft and tenderness RUQ musculoskeletal Skin: no rashes Extremities: mild Pedal edema, no calf tenderness, right medial knee hematoma Neurologic: without any new focal findings Gait not checked due to patient safety concerns  Vitals:   03/11/23 0000 03/11/23 0500 03/11/23 0800 03/11/23 1212  BP: 107/75 101/64 106/72 115/79  Pulse:  89  77 83  Resp: 18 16 17 17   Temp: 98.6 F (37 C) 97.9 F (36.6 C) (!) 97.5 F (36.4 C) 97.9 F (36.6 C)  TempSrc: Oral  Axillary Oral Oral  SpO2: 95%  96% 95%  Weight:      Height:        Intake/Output Summary (Last 24 hours) at 03/11/2023 1757 Last data filed at 03/11/2023 1700 Gross per 24 hour  Intake 1680 ml  Output 1700 ml  Net -20 ml   Filed Weights   03/05/23 0313  Weight: 99.8 kg    Data Reviewed: I have personally reviewed and interpreted daily labs, tele strips, imagings as discussed above. I reviewed all nursing notes, pharmacy notes, vitals, pertinent old records I have discussed plan of care as described above with RN and patient/family.  CBC: Recent Labs  Lab 03/07/23 0304 03/08/23 0652 03/09/23 0330 03/10/23 0249 03/11/23 0226  WBC 10.9* 11.2* 9.3 8.3 7.7  NEUTROABS 8.7* 7.0 5.0 4.2 4.0  HGB 10.6* 10.4* 10.8* 10.6* 10.1*  HCT 30.0* 30.2* 31.8* 30.4* 30.1*  MCV 98.0 101.3* 100.6* 102.7* 102.7*  PLT 216 233 261 237 241   Basic Metabolic Panel: Recent Labs  Lab 03/05/23 1832 03/05/23 2055 03/06/23 0535 03/06/23 0630 03/06/23 0908 03/06/23 1455 03/07/23 0304 03/08/23 0652 03/09/23 0330 03/10/23 0249 03/11/23 0226  NA 123* 124*  --  128* 138   < > 128* 131* 132* 134* 134*  K 2.0* 2.1*  --  2.3* 3.6   < > 3.9 3.6 3.7 3.7 4.3  CL 73* 74*  --  82* 98   < > 87* 93* 94* 99 98  CO2 36* 38*  --  37* 30   < > 29 31 26 27 27   GLUCOSE 122* 117*  --  110* 80   < > 138* 96 138* 128* 107*  BUN 12 11  --  11 <5*   < > 10 12 18 17 16   CREATININE 0.88 0.91  --  1.00 1.23   < > 0.84 0.84 1.04 0.99 1.23  CALCIUM 7.0* 7.0*  --  6.9* 7.9*   < > 7.7* 8.0* 8.0* 8.3* 8.9  MG  --   --    < >  --   --   --  2.0 1.7 1.6* 2.1 1.8  PHOS 4.0 4.1  --  3.2 2.2*  --   --   --   --   --  5.5*   < > = values in this interval not displayed.    Studies: No results found.  Scheduled Meds:  chlordiazePOXIDE  20 mg Oral TID   colchicine  0.3 mg Oral BID   enoxaparin (LOVENOX) injection  40 mg Subcutaneous Daily   folic acid  1 mg Oral Daily   gabapentin  300 mg Oral TID   lactulose  30 g Oral  TID   levETIRAcetam  500 mg Oral BID   metoprolol succinate  25 mg Oral Daily   multivitamin with minerals  1 tablet Oral Daily   nicotine  21 mg Transdermal Daily   pantoprazole  40 mg Oral Daily   QUEtiapine  200 mg Oral QHS   rifaximin  550 mg Oral BID   thiamine  100 mg Oral Daily   Vitamin D (Ergocalciferol)  50,000 Units Oral Q7 days   Continuous Infusions: PRN Meds: acetaminophen, ondansetron (ZOFRAN) IV, traMADol  Time spent: 35 minutes  Author: Gillis Santa. MD [facility] 03/11/2023  5:57 PM  To reach On-call, see care teams to locate the attending and reach out to them via [email or web address]. If 7PM-7AM, please contact night-coverage If you still have difficulty reaching the attending provider, please page the [facility] (Director on Call) for [facility] on amion for assistance.

## 2023-03-12 DIAGNOSIS — E871 Hypo-osmolality and hyponatremia: Secondary | ICD-10-CM | POA: Diagnosis not present

## 2023-03-12 DIAGNOSIS — F1010 Alcohol abuse, uncomplicated: Secondary | ICD-10-CM | POA: Diagnosis not present

## 2023-03-12 LAB — CBC WITH DIFFERENTIAL/PLATELET
Abs Immature Granulocytes: 0.29 10*3/uL — ABNORMAL HIGH (ref 0.00–0.07)
Basophils Absolute: 0 10*3/uL (ref 0.0–0.1)
Basophils Relative: 1 %
Eosinophils Absolute: 0.1 10*3/uL (ref 0.0–0.5)
HCT: 30.8 % — ABNORMAL LOW (ref 39.0–52.0)
Hemoglobin: 10.3 g/dL — ABNORMAL LOW (ref 13.0–17.0)
Immature Granulocytes: 4 %
Lymphocytes Relative: 33 %
Lymphs Abs: 2.4 10*3/uL (ref 0.7–4.0)
MCH: 34.3 pg — ABNORMAL HIGH (ref 26.0–34.0)
MCHC: 33.4 g/dL (ref 30.0–36.0)
MCV: 102.7 fL — ABNORMAL HIGH (ref 80.0–100.0)
Monocytes Absolute: 0.6 10*3/uL (ref 0.1–1.0)
Monocytes Relative: 8 %
Neutro Abs: 3.9 10*3/uL (ref 1.7–7.7)
Neutrophils Relative %: 53 %
Platelets: 246 10*3/uL (ref 150–400)
RBC: 3 MIL/uL — ABNORMAL LOW (ref 4.22–5.81)
nRBC: 0 % (ref 0.0–0.2)

## 2023-03-12 LAB — COMPREHENSIVE METABOLIC PANEL
ALT: 101 U/L — ABNORMAL HIGH (ref 0–44)
AST: 106 U/L — ABNORMAL HIGH (ref 15–41)
Alkaline Phosphatase: 117 U/L (ref 38–126)
BUN: 16 mg/dL (ref 6–20)
CO2: 22 mmol/L (ref 22–32)
Calcium: 8.5 mg/dL — ABNORMAL LOW (ref 8.9–10.3)
Chloride: 102 mmol/L (ref 98–111)
Creatinine, Ser: 1.1 mg/dL (ref 0.61–1.24)
GFR, Estimated: >60 mL/min (ref >60)
Glucose, Bld: 104 mg/dL — ABNORMAL HIGH (ref 70–99)
Potassium: 4.2 mmol/L (ref 3.5–5.1)
Sodium: 134 mmol/L — ABNORMAL LOW (ref 135–145)
Total Bilirubin: 1.2 mg/dL (ref 0.3–1.2)

## 2023-03-12 LAB — MAGNESIUM: Magnesium: 1.7 mg/dL (ref 1.7–2.4)

## 2023-03-12 LAB — PHOSPHORUS: Phosphorus: 5.5 mg/dL — ABNORMAL HIGH (ref 2.5–4.6)

## 2023-03-12 MED ORDER — PANTOPRAZOLE SODIUM 40 MG PO TBEC
40.0000 mg | DELAYED_RELEASE_TABLET | Freq: Every day | ORAL | 3 refills | Status: DC
Start: 2023-03-12 — End: 2024-01-15

## 2023-03-12 MED ORDER — METOPROLOL SUCCINATE ER 25 MG PO TB24: 25.0000 mg | ORAL_TABLET | Freq: Every day | ORAL | 3 refills | Status: DC

## 2023-03-12 MED ORDER — ALLOPURINOL 100 MG PO TABS: 100.0000 mg | ORAL_TABLET | Freq: Every day | ORAL | 11 refills | Status: DC

## 2023-03-12 MED ORDER — FOLIC ACID 1 MG PO TABS: 1.0000 mg | ORAL_TABLET | Freq: Every day | ORAL | 0 refills | Status: AC

## 2023-03-12 MED ORDER — ADULT MULTIVITAMIN W/MINERALS CH: 1.0000 | ORAL_TABLET | Freq: Every day | ORAL | Status: AC

## 2023-03-12 MED ORDER — VITAMIN D (ERGOCALCIFEROL) 1.25 MG (50000 UNIT) PO CAPS: 50000.0000 [IU] | ORAL_CAPSULE | ORAL | 0 refills | Status: DC

## 2023-03-12 MED ORDER — GABAPENTIN 300 MG PO CAPS
300.0000 mg | ORAL_CAPSULE | Freq: Three times a day (TID) | ORAL | 3 refills | Status: DC
Start: 2023-03-12 — End: 2024-01-15

## 2023-03-12 MED ORDER — SPIRONOLACTONE 100 MG PO TABS
100.0000 mg | ORAL_TABLET | Freq: Every day | ORAL | 1 refills | Status: DC
Start: 2023-03-12 — End: 2024-01-15

## 2023-03-12 MED ORDER — LEVETIRACETAM 500 MG PO TABS: 500.0000 mg | ORAL_TABLET | Freq: Two times a day (BID) | ORAL | 3 refills | Status: DC

## 2023-03-12 MED ORDER — SERTRALINE HCL 25 MG PO TABS
25.0000 mg | ORAL_TABLET | Freq: Every day | ORAL | 11 refills | Status: AC
Start: 2023-03-12 — End: 2024-03-11

## 2023-03-12 MED ORDER — VITAMIN B-1 100 MG PO TABS: 100.0000 mg | ORAL_TABLET | Freq: Every day | ORAL | 0 refills | Status: AC

## 2023-03-12 MED ORDER — QUETIAPINE FUMARATE 200 MG PO TABS: 200.0000 mg | ORAL_TABLET | Freq: Every day | ORAL | 3 refills | Status: DC

## 2023-03-12 MED ORDER — LACTULOSE 10 GM/15ML PO SOLN: 30.0000 g | Freq: Two times a day (BID) | ORAL | 11 refills | Status: DC

## 2023-03-12 NOTE — Discharge Instructions (Signed)
In a time of Crisis: [facility]  Mobile Crisis Management provides immediate crisis response, 24/7.  Call [phone]  [facility] Access Center is available 24 hours a day, 7 days a week. Customer Service Specialists will assist you to find a crisis provider that is well-matched with your needs. Your local number is: [phone]  [facility] ([facility]) IOP, individual counseling, medication management [address] [place], [place] [address] [phone] Call for intake hours; Medicaid and Uninsured    Outpatient Providers  [facility] ([facility] Group and individual counseling. [address]  [place], [place] [address] [phone] [place]: [phone]  [place]: [phone] Medicaid and uninsured.   [facility] Offers IOP groups multiple times per week. [address], [place], [place] [address] [phone] Takes Medicaid and other insurances.   [facility]  Chemical Dependency Intensive Outpatient Program (IOP) [address] [place], [place] [address] [phone] Takes commercial insurance and Medicare.   [facility]  IOP and Partial Hospitalization Program  [address]  [place], [place] [address] [phone] Private Insurance, Medicaid only for partial hospitalization  [facility] [address], [place], [place] [address] [phone] Monday-Friday. Short and Long term options. [facility] ([facility]) IOP, individual counseling, medication management [address] [place], [place] [address] [phone] Medicaid and Uninsured  [facility] [address]  [place], [place] [address] [phone] Private Insurance and Self Pay   [place] [facility] [address]  [place], [place] [address] [phone] Private Insurance, Medicaid, and Self Pay   [facility]  [address]  , [place] [address] [facility]  [name]  [place], [place] [address] [phone]  [facility]  [address] [place], [place] [address] [phone]      Residential Treatment Programs  [facility] [address] [place], [place] [address] [phone] or [phone] Detox and Residential Rehab 14 days (Medicare, Medicaid, private insurance, and self pay). No methadone. Call for pre-screen.   [facility]) [address]  [place], [place] [address] [phone] Detox (self Pay and Medicaid Limited availability) Rehab Only Male (Medicare, Medicaid, and Self Pay)-No methadone.  [facility] [address] , [place] [address] [phone] or [phone] Private Insurance only  [facility] [address] [place], [place] [address] Phone:  [phone] Must be detoxed 72 hours prior to admission; 28 day program.  Self-pay.  [facility] [address]  [place], [place] [phone] Commercial insurance, Medicare, Medicaid (not straight Medicaid). They offer assistance with transportation.   [facility] [address],  [place], [place] [address] [phone] Christian Based Program. Men only. No insurance  [facility] [address] [place], [place] [address] Women's: [phone] Men's: [phone] No Medicaid.   [facility] Locations across the [place] (mainly [place]) willing to help with transportation.  [phone] Michelle Commercial insurance plans. [facility]  [address]  [place], [place] [address] [phone] Treatment Only, must make assessment appointment, and must be sober for assessment appointment. Self  pay, Medicare A and B, [place] Medicaid, must be [place] resident. No methadone.   [facility]    [address] [place], [place] [address] [phone] No pending legal charges, Long-term work program. No methadone. Call for assessment.  [facility]  [address], [place], [place] [address] [phone] or [phone] Commercial Insurance Only  [facility] - [phone] Admissions--[phone] Private Insurance (no Medicaid). Males/Females, call to make referrals, multiple facilities   [facility] [address],  [place], [place] [address]  [phone] Men Only Upfront Fee   [facility]-no methadone [facility] [address] [place], [place] [address] [phone] ([phone]) [phone] (f)         Syringe Services Program: Due to COVID-19, syringe services programs are likely operating under different hours with limited or no fixed site hours. Some programs may not be operating at all. Please contact the program directly using the phone numbers provided below to see if they are still operating under COVID-19.  -[place] [facility] ([facility]) Fixed; mobile; peer-based Tyler Yates [phone] [email or web address] Fixed site exchange at [employer], [address] [place], [place] [address] on Wednesdays (2:00 - 5:00 pm) and Thursdays (4:00 - 8:00 pm). Pop-up mobile exchange locations: [facility], [address] [place], [place] [address] on Tuesdays (11:00 am - 1:00 pm) and Fridays (11:00 am - 1:00 pm)  -[facility] - [address], [place], [place] [address] on Tuesdays (2:00 - 4:00 pm) and Fridays (2:00 - 4:00 pm)  -[facility] - also serves [place] [facility]; Fixed; mobile; peer-based; Louise Vincent [phone] [email or web address] [address] [place], [place] [address] Delivery and  outreach available in [place] and [place], please call for more information. Monday, Tuesday: 1:00 -7:00 pm, Thursday: 4:00 pm - 8:00 pm or Friday: 1:00 pm - 8:00 pm  Medication-Assisted Treatment (MAT):  -[facility]- services [place] and surrounding areas including [place], [place], [place], [place], [place], [place], [place], [place], [place], and [place], [place]. Options include Methadone, buprenorphine or Suboxone. [address] [place], [place] [address] Phone: [phone] Mon - Fri: 5:30am - 2:00pm Sat: 5:30am -7:30am Sun: Closed Holidays: 6:00am - 8:00am  -[facility]- We use FDA-approved medications, like methadone/suboxone/sublocade, and vivitrol. These medications are then combined with customized care plans that include individual or group counseling, toxicology, and medical care directed by on-site physicians. Accepts most insurance plans, Medicaid, and private pay.  [address] [place], [place] [address] Phone: [phone] Monday-Friday 5:00 AM - 10:00 AM Saturday 6:00 AM - 8:30 AM Sunday 6:00 AM - 7:00 AM  -[facility]- [facility] is a treatment & recovery focused program. In addition to receiving methadone medication, our clients participate in individual and group counseling as well as random drug testing. If accepted into the [facility] Opioid Program, you will be provided several intake appointments and a physical exam [address] [place], [name] [address] Office: [phone]  Fax: [phone]  -[facility]- We put our community members at the center of everything we do, for remote treatment services as well as in-person, from alcohol withdrawal to opioid use and more.  [address], [place], [place] [address] [phone] Monday-Wednesday: 9:00am - 5:00pm Thursday: 9:00am - 6:00pm Friday: 9:00am - 5:00pm Saturday: 9:00am - 1:00pm Sunday:  Closed  -[facility] (Or [place]) [address], [place], [place] [address] [phone]  [place] [phone] [address] [place], [place] [address]  M-W      5:00am-12:00pm Thu     5:00am-10:00am Fri       5:00am-12:00pm Sat      5:00am-8:00am Sun     Closed  $12/daily for Methadone Treatment.  

## 2023-03-12 NOTE — TOC Progression Note (Addendum)
Transition of Care [facility]) - Progression Note    Patient Details  Name: [name] MRN: [id number] Date of Birth: [birth year]  Transition of [facility]) CM/SW Contact  Mearl Latin, LCSW Phone Number: 03/12/2023, 8:35 AM  Clinical Narrative:    8:35am-CSW informed [facility] that patient is medically stable for discharge but is still awaiting insurance approval. CSW is expecting a Peer to Peer to be offered.   10:45am-CSW received call from insurance offering a Peer to Peer for SNF [phone] option 5, deadline today at 3pm). MD aware but unable to argue need. RNCM arranging home discharge with family. ETOH resources on AVS for patient to follow up.    Expected Discharge Plan: Skilled Nursing Facility Barriers to Discharge: Continued Medical Work up  Expected Discharge Plan and Services     Post Acute Care Choice: Skilled Nursing Facility Living arrangements for the past 2 months: Single Family Home                                       Social Determinants of Health (SDOH) Interventions SDOH Screenings   Food Insecurity: No Food Insecurity (10/18/2019)  Transportation Needs: Unmet Transportation Needs (01/13/2022)  Utilities: Not At Risk (10/06/2022)  Alcohol Screen: Medium Risk (06/02/2018)  Depression (PHQ2-9): Medium Risk (01/15/2022)  Tobacco Use: High Risk (03/04/2023)    Readmission Risk Interventions    09/19/2021   12:21 PM  Readmission Risk Prevention Plan  HRI or Home Care Consult Complete  Social Work Consult for Recovery Care Planning/Counseling Complete  Palliative Care Screening Not Applicable

## 2023-03-12 NOTE — Discharge Summary (Signed)
[facility] Discharge Summary   Patient: [name] LKG:[id number]  PCP: Georganna Skeans, MD  Date of admission: 03/04/2023   Date of discharge:  03/12/2023     Discharge Diagnoses:  Active Problems:   Hypokalemia   PTSD (post-traumatic stress disorder)   Alcohol abuse   Hypomagnesemia   Hypervolemic hyponatremia   Hypocalcemia   Hepatic cirrhosis (HCC)   Leukocytosis   Essential hypertension   Alcoholic cirrhosis of liver with ascites (HCC)   Psychosis (HCC)   History of seizure   Chronic anemia   Schizoaffective disorder (HCC)   Transaminitis   Elevated bilirubin   Hypochloremia   Admitted From: Home Disposition:  Home with [facility]  Recommendations for Outpatient Follow-up:  Follow-up with PCP in 1 week, repeat BMP in 1 week, repeat vitamin D and folic acid level after 3 to 6 months. Follow with PCP for lung cancer screening. Follow-up with GI as an outpatient for further management of liver cirrhosis Follow up LABS/TEST:  as above   Contact information for after-discharge care     Destination     [facility] .   Service: Skilled Nursing Contact information: [address] [place] [address] [phone]                    Diet recommendation: Cardiac diet  Activity: The patient is advised to gradually reintroduce usual activities, as tolerated  Discharge Condition: stable  Code Status: Full code   History of present illness: As per the H and P dictated on admission Hospital Course:  52 y.o. male with a history of alcohol abuse, withdrawal-related seizure, alcoholic cirrhosis, schizoaffective disorder, GERD who presented to the ED on 03/04/2023 by EMS due to generally feeling unwell, weakness, poor oral intake, and abdominal distention/discomfort. He was found to have many severe electrolyte derangements, early alcohol withdrawal symptoms, he was found to be profoundly hyponatremic, hypokalemic as well.     Assessment and Plan: # Hyponatremia (relative access of free water) Hypokalemia, hypochloremia , Hypocalcemia: Nephrology consulted, electrolytes replaced, restrict free water.  Sodium 134, improved.  Hypokalemia resolved, chloride 102 within normal range, calcium 8.5 slightly low. Continue fluid restriction, patient was advised to follow with PCP and GI as an outpatient.  Repeat BMP after 1 week.  # Alcohol abuse - DTs - counseled, CIWA + librium.  Continue thiamine 100 mg p.o. daily for 30 days. # Folic acid level 6.4, at lower end, started folic acid supplement on discharge. # History of seizure in setting of alcohol withdrawal: Maintained on chronic keppra.  # Schizoaffective disorder:  Held SSRI during hospital stay secondary to severe hyponatremia.  Seroquel was continued.  SSRI was resumed on discharge as patient has severe depression.  Repeat BMP after 1 week if persistent hyponatremia and then patient should follow-up with psych to change medications accordingly. # Fall at home: R knee XR negative.  PT/OT eval done, home health arrangement was done.  Patient was advised to continue fall precautions. # Gout: Acute on chronic.  Uric acid level stable, allopurinol on discharge.  Patient was given colchicine and steroid during hospital stay. # Peripheral neuropathy: Continue gabapentin to avoid withdrawal. # HTN: Continue home metoprolol.  # Blurry vision in both eyes for the last 3 to 4 weeks.  Outpatient ophthalmology follow-up, CT head unremarkable x 2.  Stable. # Asymptomatic transaminitis.  Likely due to alcohol abuse, bilirubin stable, INR levels good suggesting good synthetic function, albumin low due to poor oral  intake, LFT trend improving, it upper quadrant ultrasound suggest fatty liver, ammonia is borderline s/p lactulose and Suboxone given during hospital stay.  Patient was discharged on home dose of lactulose.   # Vitamin D deficiency: started vitamin D 50,000 units p.o. weekly,  follow with PCP to repeat vitamin D level after 3 to 6 months.  Body mass index is 29.84 kg/m.  Nutrition Interventions:  - Patient was instructed, not to drive, operate heavy machinery, perform activities at heights, swimming or participation in water activities or provide baby sitting services while on Pain, Sleep and Anxiety Medications; until his outpatient Physician has advised to do so again.  - Also recommended to not to take more than prescribed Pain, Sleep and Anxiety Medications.  Patient was seen by physical therapy, who recommended Home health, which was arranged. On the day of the discharge the patient's vitals were stable, and no other acute medical condition were reported by patient. the patient was felt safe to be discharge at Home with Home health.  Consultants: Nephrologist Procedures: None  Discharge Exam: General: Appear in no distress, no Rash; Oral Mucosa Clear, moist. Cardiovascular: S1 and S2 Present, no Murmur, Respiratory: normal respiratory effort, Bilateral Air entry present and no Crackles, no wheezes Abdomen: Bowel Sound present, Soft and no tenderness, no hernia Extremities: no Pedal edema, no calf tenderness Neurology: alert and oriented to time, place, and person affect appropriate.  Filed Weights   03/05/23 0313  Weight: 99.8 kg   Vitals:   03/12/23 0743 03/12/23 1231  BP: 109/60 123/85  Pulse: 91 87  Resp: 18 20  Temp: 98.4 F (36.9 C) 98.5 F (36.9 C)  SpO2: 95% 96%    DISCHARGE MEDICATION: Allergies as of 03/12/2023       Reactions   Bupropion Other (See Comments)   Caused seizures Caused seizures Feel as though I will have a seizure when taking this med        Medication List     STOP taking these medications    ibuprofen 200 MG tablet Commonly known as: ADVIL       TAKE these medications    allopurinol 100 MG tablet Commonly known as: ZYLOPRIM Take 1 tablet (100 mg total) by mouth daily.   folic acid 1 MG  tablet Commonly known as: FOLVITE Take 1 tablet (1 mg total) by mouth daily. Start taking on: March 13, 2023   gabapentin 300 MG capsule Commonly known as: NEURONTIN Take 1 capsule (300 mg total) by mouth 3 (three) times daily.   lactulose 10 GM/15ML solution Commonly known as: CHRONULAC Take 45 mLs (30 g total) by mouth 2 (two) times daily.   levETIRAcetam 500 MG tablet Commonly known as: KEPPRA Take 1 tablet (500 mg total) by mouth 2 (two) times daily.   metoprolol succinate 25 MG 24 hr tablet Commonly known as: TOPROL-XL Take 1 tablet (25 mg total) by mouth daily. Hold if SBP <110 mmHg and or HR <65 What changed: additional instructions   multivitamin with minerals Tabs tablet Take 1 tablet by mouth daily. Start taking on: March 13, 2023   pantoprazole 40 MG tablet Commonly known as: Protonix Take 1 tablet (40 mg total) by mouth daily.   QUEtiapine 200 MG tablet Commonly known as: SEROQUEL Take 1 tablet (200 mg total) by mouth at bedtime.   sertraline 25 MG tablet Commonly known as: ZOLOFT Take 1 tablet (25 mg total) by mouth daily.   spironolactone 100 MG tablet Commonly known as: ALDACTONE  Take 1 tablet (100 mg total) by mouth daily. Skip the dose if systolic BP less than 120 mmHg What changed: additional instructions   thiamine 100 MG tablet Commonly known as: Vitamin B-1 Take 1 tablet (100 mg total) by mouth daily. Start taking on: March 13, 2023   Vitamin D (Ergocalciferol) 1.25 MG (50000 UNIT) Caps capsule Commonly known as: DRISDOL Take 1 capsule (50,000 Units total) by mouth every 7 (seven) days. Start taking on: March 18, 2023       Allergies  Allergen Reactions   Bupropion Other (See Comments)    Caused seizures Caused seizures Feel as though I will have a seizure when taking this med   Discharge Instructions     Call MD for:  difficulty breathing, headache or visual disturbances   Complete by: As directed    Call MD for:  extreme fatigue    Complete by: As directed    Call MD for:  persistant dizziness or light-headedness   Complete by: As directed    Call MD for:  persistant nausea and vomiting   Complete by: As directed    Call MD for:  severe uncontrolled pain   Complete by: As directed    Call MD for:  temperature >100.4   Complete by: As directed    Diet - low sodium heart healthy   Complete by: As directed    Discharge instructions   Complete by: As directed    Follow-up with PCP in 1 week, repeat BMP in 1 week, repeat vitamin D and folic acid level after 3 to 6 months. Follow with PCP for lung cancer screening. Follow-up with GI as an outpatient for further management of liver cirrhosis   Increase activity slowly   Complete by: As directed        The results of significant diagnostics from this hospitalization (including imaging, microbiology, ancillary and laboratory) are listed below for reference.    Significant Diagnostic Studies: US Abdomen Limited RUQ (LIVER/GB)  Result Date: 03/09/2023 CLINICAL DATA:  Transaminitis EXAM: ULTRASOUND ABDOMEN LIMITED RIGHT UPPER QUADRANT COMPARISON:  03/05/2023. FINDINGS: Gallbladder: No gallstones or wall thickening visualized. No sonographic Murphy sign noted by sonographer. Common bile duct: Diameter: 4 mm Liver: Diffusely echogenic hepatic parenchyma. With this level of echogenicity, evaluation for underlying mass lesion is limited and if needed further workup with contrast CT or MRI as clinically appropriate. Portal vein is patent on color Doppler imaging with normal direction of blood flow towards the liver. Other: None. IMPRESSION: Fatty liver infiltration. No biliary ductal dilatation. No gallstones. Electronically Signed   By: Karen Kays M.D.   On: 03/09/2023 17:32   CT HEAD WO CONTRAST ( )  Result Date: 03/07/2023 CLINICAL DATA:  Mental status change, unknown cause EXAM: CT HEAD WITHOUT CONTRAST TECHNIQUE: Contiguous axial images were obtained from the base of the  skull through the vertex without intravenous contrast. RADIATION DOSE REDUCTION: This exam was performed according to the departmental dose-optimization program which includes automated exposure control, adjustment of the mA and/or kV according to patient size and/or use of iterative reconstruction technique. COMPARISON:  03/05/2023 FINDINGS: Brain: Mild age advanced cerebral atrophy. No acute intracranial abnormality. Specifically, no hemorrhage, hydrocephalus, mass lesion, acute infarction, or significant intracranial injury. Vascular: No hyperdense vessel or unexpected calcification. Skull: No acute calvarial abnormality. Sinuses/Orbits: No acute findings Other: None IMPRESSION: Mild age advanced atrophy. No acute intracranial abnormality. Electronically Signed   By: Charlett Nose M.D.   On: 03/07/2023 19:20   [place]  ASCITES (ABDOMEN LIMITED)  Result Date: 03/05/2023 CLINICAL DATA:  [id number] Ascites [id number] EXAM: LIMITED ABDOMEN ULTRASOUND FOR ASCITES TECHNIQUE: Limited ultrasound survey for ascites was performed in all four abdominal quadrants. COMPARISON:  Ct abd/pelvis 08/26/2021, ultrasound abdomen 09/07/2021 FINDINGS: No ascites.  Hepatic steatosis. IMPRESSION: 1. No ascites. 2. Hepatic steatosis. Electronically Signed   By: Tish Frederickson M.D.   On: 03/05/2023 03:53   CT HEAD WO CONTRAST ( )  Result Date: 03/05/2023 CLINICAL DATA:  Head trauma, abnormal mental status. EXAM: CT HEAD WITHOUT CONTRAST TECHNIQUE: Contiguous axial images were obtained from the base of the skull through the vertex without intravenous contrast. RADIATION DOSE REDUCTION: This exam was performed according to the departmental dose-optimization program which includes automated exposure control, adjustment of the mA and/or kV according to patient size and/or use of iterative reconstruction technique. COMPARISON:  09/07/2021. FINDINGS: Brain: No acute intracranial hemorrhage, midline shift or mass effect. No extra-axial fluid  collection. Gray-white matter differentiation is within normal limits. No hydrocephalus. Vascular: No hyperdense vessel or unexpected calcification. Skull: Normal. Negative for fracture or focal lesion. Sinuses/Orbits: A small mucosal retention cyst or polyp is noted in the right maxillary sinus. Debris is noted in the posterior nasopharynx. No acute orbital abnormality. Other: None. IMPRESSION: No acute intracranial process. Electronically Signed   By: Thornell Sartorius M.D.   On: 03/05/2023 01:41   DG Knee Complete 4 Views Right  Result Date: 03/05/2023 CLINICAL DATA:  Fall with right knee injury. EXAM: RIGHT KNEE - COMPLETE 4+ VIEW COMPARISON:  Radiographs 03/21/2011 FINDINGS: No evidence of fracture, dislocation, or joint effusion. No evidence of arthropathy or other focal bone abnormality. Soft tissues are unremarkable. IMPRESSION: Negative. Electronically Signed   By: Minerva Fester M.D.   On: 03/05/2023 00:24    Microbiology: No results found for this or any previous visit (from the past 240 hour(s)).   Labs: CBC: Recent Labs  Lab 03/08/23 0652 03/09/23 0330 03/10/23 0249 03/11/23 0226 03/12/23 0616  WBC 11.2* 9.3 8.3 7.7 7.2  NEUTROABS 7.0 5.0 4.2 4.0 3.9  HGB 10.4* 10.8* 10.6* 10.1* 10.3*  HCT 30.2* 31.8* 30.4* 30.1* 30.8*  MCV 101.3* 100.6* 102.7* 102.7* 102.7*  PLT 233 261 237 241 246   Basic Metabolic Panel: Recent Labs  Lab 03/05/23 2055 03/06/23 0535 03/06/23 0630 03/06/23 0908 03/06/23 1455 03/08/23 0652 03/09/23 0330 03/10/23 0249 03/11/23 0226 03/12/23 0616  NA 124*  --  128* 138   < > 131* 132* 134* 134* 134*  K 2.1*  --  2.3* 3.6   < > 3.6 3.7 3.7 4.3 4.2  CL 74*  --  82* 98   < > 93* 94* 99 98 102  CO2 38*  --  37* 30   < > 31 26 27 27 22   GLUCOSE 117*  --  110* 80   < > 96 138* 128* 107* 104*  BUN 11  --  11 <5*   < > 12 18 17 16 16   CREATININE 0.91  --  1.00 1.23   < > 0.84 1.04 0.99 1.23 1.10  CALCIUM 7.0*  --  6.9* 7.9*   < > 8.0* 8.0* 8.3* 8.9 8.5*   MG  --    < >  --   --    < > 1.7 1.6* 2.1 1.8 1.7  PHOS 4.1  --  3.2 2.2*  --   --   --   --  5.5* 5.5*   < > = values in this  interval not displayed.   Liver Function Tests: Recent Labs  Lab 03/08/23 7425 03/09/23 0330 03/10/23 0249 03/11/23 0226 03/12/23 0616  AST 175* 168* 153* 127* 106*  ALT 125* 128* 127* 114* 101*  ALKPHOS 119 129* 127* 121 117  BILITOT 1.3* 1.3* 1.2 0.8 1.2  PROT 5.2* 5.2* 5.1* 5.1* 5.3*  ALBUMIN 2.3* 2.3* 2.3* 2.3* 2.4*   No results for input(s): "LIPASE", "AMYLASE" in the last 168 hours. Recent Labs  Lab 03/09/23 0330 03/10/23 0249  AMMONIA 55* 37*   Cardiac Enzymes: No results for input(s): "CKTOTAL", "CKMB", "CKMBINDEX", "TROPONINI" in the last 168 hours. BNP (last 3 results) Recent Labs    03/08/23 0652 03/09/23 0330 03/10/23 0249  BNP 249.7* 102.5* 42.2   CBG: No results for input(s): "GLUCAP" in the last 168 hours.  Time spent: 35 minutes  Signed:  Gillis Santa  [facility] 03/12/2023 2:07 PM

## 2023-03-12 NOTE — Progress Notes (Signed)
Discharge instructions reviewed with pt.  Copy of instructions given to pt. Pt informed his scripts were sent to his pharmacy for pick up. Pt has called his sister to come and pick him up and will call when she has arrived at the main entrance. Pt getting dressed.  Pt to be d/c'd via wheelchair with belongings, with his sister picking him up and he is to ask her to go by his pharmacy so he can pick up his medications.           To be escorted by staff or hospital volunteer when sister arrives.   Shamikia Linskey,RN SWOT

## 2023-03-13 ENCOUNTER — Telehealth: Payer: Self-pay

## 2023-03-13 NOTE — Transitions of Care (Post Inpatient/ED Visit) (Signed)
   03/13/2023  Name: [name] MRN: [id number] DOB: March 02, 1971  Today's TOC FU Call Status: Today's TOC FU Call Status:: Unsuccessul Call (1st Attempt) Unsuccessful Call (1st Attempt) Date: 03/13/23  Attempted to reach the patient regarding the most recent Inpatient/ED visit.  Follow Up Plan: Additional outreach attempts will be made to reach the patient to complete the Transitions of Care (Post Inpatient/ED visit) call.   Jodelle Gross, RN, BSN, CCM Care Management Coordinator [place]/[facility] Phone: [phone]/Fax: [phone]

## 2023-03-16 ENCOUNTER — Telehealth: Payer: Self-pay

## 2023-03-16 NOTE — Transitions of Care (Post Inpatient/ED Visit) (Signed)
   03/16/2023  Name: Onie Muscolino MRN: [id number] DOB: [birth year]  Today's TOC FU Call Status: Today's TOC FU Call Status:: Unsuccessful Call (2nd Attempt) Unsuccessful Call (2nd Attempt) Date: 03/16/23  Attempted to reach the patient regarding the most recent Inpatient/ED visit.  Follow Up Plan: Additional outreach attempts will be made to reach the patient to complete the Transitions of Care (Post Inpatient/ED visit) call.   Jodelle Gross, RN, BSN, CCM Care Management Coordinator [place]/[facility] Phone: [phone]/Fax: [phone]

## 2023-03-17 ENCOUNTER — Telehealth: Payer: Self-pay

## 2023-03-17 NOTE — Transitions of Care (Post Inpatient/ED Visit) (Signed)
03/17/2023  Name: [name] MRN: [id number] DOB: Dec 18, 1970  Today's TOC FU Call Status: Today's TOC FU Call Status:: Successful TOC FU Call Competed TOC FU Call Complete Date: 03/17/23  Transition Care Management Follow-up Telephone Call Date of Discharge: 03/12/23 Discharge Facility: [facility]) Type of Discharge: Inpatient Admission Primary Inpatient Discharge Diagnosis:: Alcohol Withdrawl Syndrome How have you been since you were released from the hospital?: Better Any questions or concerns?: No  Items Reviewed: Did you receive and understand the discharge instructions provided?: Yes Medications obtained,verified, and reconciled?: Yes (Medications Reviewed) Any new allergies since your discharge?: No Dietary orders reviewed?: No Do you have support at home?: Yes People in Home: sibling(s) Name of Support/Comfort Primary Source: sister, Gigi Gin  Medications Reviewed Today: Medications Reviewed Today     Reviewed by Jodelle Gross, RN (Case Manager) on 03/17/23 at 1254  Med List Status: <None>   Medication Order Taking? Sig Documenting Provider Last Dose Status Informant  allopurinol (ZYLOPRIM) 100 MG tablet [id number] Yes Take 1 tablet (100 mg total) by mouth daily. Gillis Santa, MD Taking Active   folic acid (FOLVITE) 1 MG tablet [id number] Yes Take 1 tablet (1 mg total) by mouth daily. Gillis Santa, MD Taking Active   gabapentin (NEURONTIN) 300 MG capsule [id number] Yes Take 1 capsule (300 mg total) by mouth 3 (three) times daily. Gillis Santa, MD Taking Active   lactulose [facility]) 10 GM/15ML solution [id number] Yes Take 45 mLs (30 g total) by mouth 2 (two) times daily. Gillis Santa, MD Taking Active   levETIRAcetam (KEPPRA) 500 MG tablet [id number] Yes Take 1 tablet (500 mg total) by mouth 2 (two) times daily. Gillis Santa, MD Taking Active   metoprolol succinate (TOPROL-XL) 25 MG 24 hr tablet [id number] Yes Take 1 tablet (25 mg total) by mouth daily. Hold if SBP  <110 mmHg and or HR <65 Gillis Santa, MD Taking Active   Multiple Vitamin (MULTIVITAMIN WITH MINERALS) TABS tablet [id number] No Take 1 tablet by mouth daily.  Patient not taking: Reported on 03/17/2023   Gillis Santa, MD Not Taking Active   pantoprazole (PROTONIX) 40 MG tablet [id number] Yes Take 1 tablet (40 mg total) by mouth daily. Gillis Santa, MD Taking Active   QUEtiapine (SEROQUEL) 200 MG tablet [id number] Yes Take 1 tablet (200 mg total) by mouth at bedtime. Gillis Santa, MD Taking Active   sertraline (ZOLOFT) 25 MG tablet [id number] Yes Take 1 tablet (25 mg total) by mouth daily. Gillis Santa, MD Taking Active   spironolactone (ALDACTONE) 100 MG tablet [id number] Yes Take 1 tablet (100 mg total) by mouth daily. Skip the dose if systolic BP less than 120 mmHg Gillis Santa, MD Taking Active   thiamine (VITAMIN B-1) 100 MG tablet [id number] Yes Take 1 tablet (100 mg total) by mouth daily. Gillis Santa, MD Taking Active   Vitamin D, Ergocalciferol, (DRISDOL) 1.25 MG (50000 UNIT) CAPS capsule [id number] Yes Take 1 capsule (50,000 Units total) by mouth every 7 (seven) days. Gillis Santa, MD Taking Active             Home Care and Equipment/Supplies: Were Home Health Services Ordered?: No Any new equipment or medical supplies ordered?: No  Functional Questionnaire: Do you need assistance with bathing/showering or dressing?: No Do you need assistance with meal preparation?: No Do you need assistance with eating?: No Do you have difficulty maintaining continence: No Do you need assistance with getting out of bed/getting out of a chair/moving?: No Do you have difficulty managing or taking  your medications?: No  Follow up appointments reviewed: PCP Follow-up appointment confirmed?: Yes Date of PCP follow-up appointment?: 03/25/23 Follow-up Provider: Ricky Stabs, NP Specialist Hospital Follow-up appointment confirmed?: NA Do you need transportation to your follow-up appointment?:  No Do you understand care options if your condition(s) worsen?: Yes-patient verbalized understanding  SDOH Interventions Today    Flowsheet Row Most Recent Value  SDOH Interventions   Food Insecurity Interventions Intervention Not Indicated  Transportation Interventions Intervention Not Indicated  [Patient now has a car]  Utilities Interventions Intervention Not Indicated      TOC Interventions Today    Flowsheet Row Most Recent Value  TOC Interventions   TOC Interventions Discussed/Reviewed TOC Interventions Discussed, TOC Interventions Reviewed, Arranged PCP follow up within 7 days/Care Guide scheduled       Jodelle Gross, RN, BSN, CCM Care Management Coordinator [facility] Phone: [phone]/Fax: [phone]

## 2023-03-25 ENCOUNTER — Ambulatory Visit (INDEPENDENT_AMBULATORY_CARE_PROVIDER_SITE_OTHER): Payer: Medicare Other | Admitting: Family

## 2023-03-25 ENCOUNTER — Telehealth: Payer: Self-pay | Admitting: Family

## 2023-03-25 ENCOUNTER — Encounter: Payer: Self-pay | Admitting: Family

## 2023-03-25 VITALS — BP 105/73 | HR 91 | Temp 98.3°F | Ht 72.0 in | Wt 247.6 lb

## 2023-03-25 DIAGNOSIS — F4310 Post-traumatic stress disorder, unspecified: Secondary | ICD-10-CM

## 2023-03-25 DIAGNOSIS — Z122 Encounter for screening for malignant neoplasm of respiratory organs: Secondary | ICD-10-CM

## 2023-03-25 DIAGNOSIS — F29 Unspecified psychosis not due to a substance or known physiological condition: Secondary | ICD-10-CM

## 2023-03-25 DIAGNOSIS — Z8669 Personal history of other diseases of the nervous system and sense organs: Secondary | ICD-10-CM

## 2023-03-25 DIAGNOSIS — F17210 Nicotine dependence, cigarettes, uncomplicated: Secondary | ICD-10-CM | POA: Diagnosis not present

## 2023-03-25 DIAGNOSIS — E559 Vitamin D deficiency, unspecified: Secondary | ICD-10-CM

## 2023-03-25 DIAGNOSIS — R7401 Elevation of levels of liver transaminase levels: Secondary | ICD-10-CM

## 2023-03-25 DIAGNOSIS — I10 Essential (primary) hypertension: Secondary | ICD-10-CM | POA: Diagnosis not present

## 2023-03-25 DIAGNOSIS — Z09 Encounter for follow-up examination after completed treatment for conditions other than malignant neoplasm: Secondary | ICD-10-CM | POA: Diagnosis not present

## 2023-03-25 DIAGNOSIS — M109 Gout, unspecified: Secondary | ICD-10-CM

## 2023-03-25 DIAGNOSIS — E871 Hypo-osmolality and hyponatremia: Secondary | ICD-10-CM

## 2023-03-25 DIAGNOSIS — R609 Edema, unspecified: Secondary | ICD-10-CM | POA: Diagnosis not present

## 2023-03-25 DIAGNOSIS — Z87898 Personal history of other specified conditions: Secondary | ICD-10-CM

## 2023-03-25 DIAGNOSIS — K7460 Unspecified cirrhosis of liver: Secondary | ICD-10-CM

## 2023-03-25 DIAGNOSIS — K7030 Alcoholic cirrhosis of liver without ascites: Secondary | ICD-10-CM

## 2023-03-25 DIAGNOSIS — F1010 Alcohol abuse, uncomplicated: Secondary | ICD-10-CM

## 2023-03-25 DIAGNOSIS — G588 Other specified mononeuropathies: Secondary | ICD-10-CM

## 2023-03-25 DIAGNOSIS — E878 Other disorders of electrolyte and fluid balance, not elsewhere classified: Secondary | ICD-10-CM

## 2023-03-25 DIAGNOSIS — E876 Hypokalemia: Secondary | ICD-10-CM

## 2023-03-25 DIAGNOSIS — H538 Other visual disturbances: Secondary | ICD-10-CM

## 2023-03-25 DIAGNOSIS — E538 Deficiency of other specified B group vitamins: Secondary | ICD-10-CM

## 2023-03-25 DIAGNOSIS — F259 Schizoaffective disorder, unspecified: Secondary | ICD-10-CM

## 2023-03-25 DIAGNOSIS — W19XXXD Unspecified fall, subsequent encounter: Secondary | ICD-10-CM

## 2023-03-25 MED ORDER — VITAMIN D (ERGOCALCIFEROL) 1.25 MG (50000 UNIT) PO CAPS
50000.0000 [IU] | ORAL_CAPSULE | ORAL | 0 refills | Status: AC
Start: 2023-03-25 — End: 2023-06-23

## 2023-03-25 NOTE — Progress Notes (Signed)
Pt asking for referral for liver doctor.

## 2023-03-25 NOTE — Progress Notes (Signed)
TRANSITION OF CARE VISIT   Date of Admission: 03/04/2023  Date of Discharge: 03/12/2023  Transitions of Care Call: 03/17/2023  Discharged from: [facility]   Discharge Diagnosis:  Active Problems:   Hypokalemia   PTSD (post-traumatic stress disorder)   Alcohol abuse   Hypomagnesemia   Hypervolemic hyponatremia   Hypocalcemia   Hepatic cirrhosis (HCC)   Leukocytosis   Essential hypertension   Alcoholic cirrhosis of liver with ascites (HCC)   Psychosis (HCC)   History of seizure   Chronic anemia   Schizoaffective disorder (HCC)   Transaminitis   Elevated bilirubin   Hypochloremia   Summary of Admission per MD note:  Recommendations for Outpatient Follow-up:  Follow-up with PCP in 1 week, repeat BMP in 1 week, repeat vitamin D and folic acid level after 3 to 6 months. Follow with PCP for lung cancer screening. Follow-up with GI as an outpatient for further management of liver cirrhosis Follow up LABS/TEST:  as above   Assessment and Plan: # Hyponatremia (relative access of free water) Hypokalemia, hypochloremia , Hypocalcemia: Nephrology consulted, electrolytes replaced, restrict free water.  Sodium 134, improved.  Hypokalemia resolved, chloride 102 within normal range, calcium 8.5 slightly low. Continue fluid restriction, patient was advised to follow with PCP and GI as an outpatient.  Repeat BMP after 1 week.  # Alcohol abuse - DTs - counseled, CIWA + librium.  Continue thiamine 100 mg p.o. daily for 30 days. # Folic acid level 6.4, at lower end, started folic acid supplement on discharge. # History of seizure in setting of alcohol withdrawal: Maintained on chronic keppra.  # Schizoaffective disorder:  Held SSRI during hospital stay secondary to severe hyponatremia.  Seroquel was continued.  SSRI was resumed on discharge as patient has severe depression.  Repeat BMP after 1 week if persistent hyponatremia and then  patient should follow-up with psych to change medications accordingly. # Fall at home: R knee XR negative.  PT/OT eval done, home health arrangement was done.  Patient was advised to continue fall precautions. # Gout: Acute on chronic.  Uric acid level stable, allopurinol on discharge.  Patient was given colchicine and steroid during hospital stay. # Peripheral neuropathy: Continue gabapentin to avoid withdrawal. # HTN: Continue home metoprolol.  # Blurry vision in both eyes for the last 3 to 4 weeks.  Outpatient ophthalmology follow-up, CT head unremarkable x 2.  Stable. # Asymptomatic transaminitis.  Likely due to alcohol abuse, bilirubin stable, INR levels good suggesting good synthetic function, albumin low due to poor oral intake, LFT trend improving, it upper quadrant ultrasound suggest fatty liver, ammonia is borderline s/p lactulose and Suboxone given during hospital stay.  Patient was discharged on home dose of lactulose.   # Vitamin D deficiency: started vitamin D 50,000 units p.o. weekly, follow with PCP to repeat vitamin D level after 3 to 6 months.   Body mass index is 29.84 kg/m.  Nutrition Interventions:   - Patient was instructed, not to drive, operate heavy machinery, perform activities at heights, swimming or participation in water activities or provide baby sitting services while on Pain, Sleep and Anxiety Medications; until his outpatient Physician has advised to do so again.  - Also recommended to not to take more than prescribed Pain, Sleep and Anxiety Medications.   Patient was seen by physical therapy, who recommended Home health, which was arranged. On the day of the discharge the patient's vitals were stable, and no other acute medical condition were reported by patient.  the patient was felt safe to be discharge at Home with Home health.   Consultants: Nephrologist   Medication List       STOP taking these medications     ibuprofen 200 MG tablet Commonly known  as: ADVIL           TAKE these medications     allopurinol 100 MG tablet Commonly known as: ZYLOPRIM Take 1 tablet (100 mg total) by mouth daily.    folic acid 1 MG tablet Commonly known as: FOLVITE Take 1 tablet (1 mg total) by mouth daily. Start taking on: March 13, 2023    gabapentin 300 MG capsule Commonly known as: NEURONTIN Take 1 capsule (300 mg total) by mouth 3 (three) times daily.    lactulose 10 GM/15ML solution Commonly known as: CHRONULAC Take 45 mLs (30 g total) by mouth 2 (two) times daily.    levETIRAcetam 500 MG tablet Commonly known as: KEPPRA Take 1 tablet (500 mg total) by mouth 2 (two) times daily.    metoprolol succinate 25 MG 24 hr tablet Commonly known as: TOPROL-XL Take 1 tablet (25 mg total) by mouth daily. Hold if SBP <110 mmHg and or HR <65 What changed: additional instructions    multivitamin with minerals Tabs tablet Take 1 tablet by mouth daily. Start taking on: March 13, 2023    pantoprazole 40 MG tablet Commonly known as: Protonix Take 1 tablet (40 mg total) by mouth daily.    QUEtiapine 200 MG tablet Commonly known as: SEROQUEL Take 1 tablet (200 mg total) by mouth at bedtime.    sertraline 25 MG tablet Commonly known as: ZOLOFT Take 1 tablet (25 mg total) by mouth daily.    spironolactone 100 MG tablet Commonly known as: ALDACTONE Take 1 tablet (100 mg total) by mouth daily. Skip the dose if systolic BP less than 120 mmHg What changed: additional instructions    thiamine 100 MG tablet Commonly known as: Vitamin B-1 Take 1 tablet (100 mg total) by mouth daily. Start taking on: March 13, 2023    Vitamin D (Ergocalciferol) 1.25 MG (50000 UNIT) Caps capsule Commonly known as: DRISDOL Take 1 capsule (50,000 Units total) by mouth every 7 (seven) days. Start taking on: March 18, 2023      Today's visit 03/25/2023: Reports since hospital discharge feeling overall improved. However, reports bilateral lower extremity swelling  causing discomfort for several days/denies red flag symptoms. Reports he is no longer consuming alcohol. Reports smoking 1 PPD x 33 years. He denies any recent seizures. Reports he is taking all medications as prescribed except for Vitamin D and said this was never sent to pharmacy from hospital discharge. Reports he was recently seen by Ophthalmology for eye exam. Reports he never received call from home health. He denies thoughts of self-harm, suicidal ideations, homicidal ideations. No further issues/concerns for discussion today.    Patient/Caregiver self-reported problems/concerns: see above  MEDICATIONS  Medication Reconciliation conducted with patient/caregiver? (Yes/ No): Yes   New medications prescribed/discontinued upon discharge? (Yes/No): Yes  Barriers identified related to medications: No  LABS  Lab Reviewed (Yes/No/NA):  Yes  PHYSICAL EXAM:  Physical Exam HENT:     Head: Normocephalic and atraumatic.     Nose: Nose normal.     Mouth/Throat:     Mouth: Mucous membranes are moist.     Pharynx: Oropharynx is clear.  Eyes:     Extraocular Movements: Extraocular movements intact.     Conjunctiva/sclera: Conjunctivae normal.  Pupils: Pupils are equal, round, and reactive to light.  Cardiovascular:     Rate and Rhythm: Normal rate and regular rhythm.     Pulses: Normal pulses.     Heart sounds: Normal heart sounds.  Pulmonary:     Effort: Pulmonary effort is normal.     Breath sounds: Normal breath sounds.  Musculoskeletal:        General: Normal range of motion.     Right shoulder: Normal.     Left shoulder: Normal.     Right upper arm: Normal.     Left upper arm: Normal.     Right elbow: Normal.     Left elbow: Normal.     Right forearm: Normal.     Left forearm: Normal.     Right wrist: Normal.     Left wrist: Normal.     Right hand: Normal.     Left hand: Normal.     Cervical back: Normal, normal range of motion and neck supple.     Thoracic back:  Normal.     Lumbar back: Normal.     Right hip: Normal.     Left hip: Normal.     Right upper leg: Normal.     Left upper leg: Normal.     Right knee: Normal.     Left knee: Normal.     Right lower leg: Edema present.     Left lower leg: Edema present.     Right ankle: Swelling present.     Left ankle: Swelling present.     Right foot: Swelling present.     Left foot: Swelling present.     Comments: Bilateral lower extremities, ankles, and feet +1 edema with no additional presentation.   Neurological:     General: No focal deficit present.     Mental Status: He is alert and oriented to person, place, and time.  Psychiatric:        Mood and Affect: Mood normal.        Behavior: Behavior normal.    ASSESSMENT AND PLAN: 1. Hospital discharge follow-up - Reviewed hospital course, current medications, ensured proper follow-up in place, and addressed concerns.   2. Alcoholic cirrhosis, unspecified whether ascites present (HCC) 3. Hepatic cirrhosis, unspecified hepatic cirrhosis type, unspecified whether ascites present (HCC) 4. Transaminitis - Continue present management. - Referral to Gastroenterology for further evaluation/management.  - Follow-up with primary provider as scheduled.  - Ambulatory referral to Gastroenterology  5. Hypocalcemia 6. Hypokalemia 7. Hyponatremia 8. Hypochloremia - Routine screening.  - Basic Metabolic Panel  9. Essential hypertension - Continue present management.  - Counseled on blood pressure goal of less than 130/80, low-sodium, DASH diet, medication compliance, and 150 minutes of moderate intensity exercise per week as tolerated. Counseled on medication adherence and adverse effects. - Follow-up with primary provider in 3 months or sooner if needed.   10. PTSD (post-traumatic stress disorder) 11. Psychosis, unspecified psychosis type (HCC) 12. Schizoaffective disorder, unspecified type (HCC) 13. Alcohol abuse - Patient denies thoughts of  self-harm, suicidal ideations, homicidal ideations. - Continue present management.  - Referral to Psychiatry for further evaluation/management. - Follow-up with primary provider as scheduled. - Ambulatory referral to Psychiatry  14. Vitamin D deficiency - Continue present management.  - Follow-up with primary provider in 3 months or sooner if needed.  - Vitamin D, Ergocalciferol, (DRISDOL) 1.25 MG (50000 UNIT) CAPS capsule; Take 1 capsule (50,000 Units total) by mouth every 7 (seven) days.  Dispense:  12 capsule; Refill: 0  15. Folic acid deficiency - Continue present management. - Follow-up with primary provider as scheduled.   16. History of seizure - Continue present management.  - Referral to Neurology for further evaluation/management. - Follow-up with primary provider as scheduled.  - Ambulatory referral to Neurology  17. Other mononeuropathy - Continue present management. - Follow-up with primary provider as scheduled.  18. Gout, unspecified cause, unspecified chronicity, unspecified site - Continue present management. - Follow-up with primary provider as scheduled.  19. Dependent edema - Routine screening.  - VAS [place] LOWER EXTREMITY VENOUS (DVT); Future  20. Blurry vision, bilateral - Keep all scheduled appointments with Ophthalmology.  21. Encounter for screening for lung cancer - Routine screening.  - CT CHEST LUNG CA SCREEN LOW DOSE W/O CM; Future  22. Fall in home, subsequent encounter - Message sent to our office RN case manager for follow-up.   PATIENT EDUCATION PROVIDED: See AVS   FOLLOW-UP (Include any further testing or referrals):  - Referral to Gastroenterology.  - Referral to Psychiatry. - Referral to Neurology. - Keep all scheduled appointments with primary provider.   Patient was given clear instructions to go to Emergency Department or return to medical center if symptoms don't improve, worsen, or new problems develop.The patient verbalized  understanding.

## 2023-03-26 NOTE — Telephone Encounter (Signed)
Still waiting for report back from Ryan Jordan, RN/[place] regarding the status of home health orders. I called patient to provide him with an update that I am checking on the orders from the hospital and I had to leave a message for him requesting he call back.

## 2023-03-26 NOTE — Telephone Encounter (Signed)
Noted  

## 2023-03-27 NOTE — Telephone Encounter (Signed)
Message received from [name] , RN: Just heard from [facility]). They have accepted this Patient for [facility] and will secure orders from PCP. Can you update the Patient that he should hear from them beginning of next week to come and do initial home visit

## 2023-03-27 NOTE — Telephone Encounter (Signed)
I spoke to the patient and informed him that he should be hearing from [facility] at the beginning of next week.  He said he understood and I told him to call me if he has not heard from them by the middle of next week.

## 2023-04-01 ENCOUNTER — Telehealth: Payer: Self-pay | Admitting: Family Medicine

## 2023-04-01 NOTE — Telephone Encounter (Signed)
Left voicemail to call office back to get prior auth done.

## 2023-04-01 NOTE — Telephone Encounter (Signed)
Copied from CRM [id number]. Topic: General - Other >> Apr 01, 2023  2:32 PM Franchot Heidelberg wrote: Reason for CRM: Lupita Leash from [facility] needs the prior auth before she can schedule Best contact: [phone]

## 2023-04-01 NOTE — Telephone Encounter (Signed)
Call was returned to [facility]. LVM to call back

## 2023-04-01 NOTE — Telephone Encounter (Signed)
Please call and do a PA for the referral Amy sent

## 2023-04-02 NOTE — Telephone Encounter (Signed)
Called to get PA done, left voicemail to call the office back

## 2023-04-13 ENCOUNTER — Inpatient Hospital Stay: Admission: RE | Admit: 2023-04-13 | Payer: Medicare Other | Source: Ambulatory Visit

## 2023-04-23 ENCOUNTER — Ambulatory Visit: Payer: Medicare Other | Admitting: Family Medicine

## 2023-04-23 ENCOUNTER — Encounter: Payer: Self-pay | Admitting: Family Medicine

## 2023-05-12 ENCOUNTER — Telehealth (HOSPITAL_COMMUNITY): Payer: Self-pay

## 2023-09-21 ENCOUNTER — Other Ambulatory Visit: Payer: Self-pay | Admitting: Family Medicine

## 2023-11-03 ENCOUNTER — Encounter: Payer: Self-pay | Admitting: Neurology

## 2023-11-03 ENCOUNTER — Ambulatory Visit: Payer: Medicare Other | Admitting: Neurology

## 2023-11-11 ENCOUNTER — Encounter (HOSPITAL_COMMUNITY): Payer: Self-pay

## 2023-11-11 ENCOUNTER — Other Ambulatory Visit: Payer: Self-pay

## 2023-11-11 ENCOUNTER — Emergency Department (HOSPITAL_COMMUNITY)

## 2023-11-11 ENCOUNTER — Inpatient Hospital Stay (HOSPITAL_COMMUNITY)
Admission: EM | Admit: 2023-11-11 | Discharge: 2023-11-15 | DRG: 897 | Disposition: A | Discharge status: Home / Self Care | Attending: Internal Medicine | Admitting: Internal Medicine

## 2023-11-11 DIAGNOSIS — I10 Essential (primary) hypertension: Secondary | ICD-10-CM | POA: Diagnosis not present

## 2023-11-11 DIAGNOSIS — F10239 Alcohol dependence with withdrawal, unspecified: Principal | ICD-10-CM | POA: Diagnosis present

## 2023-11-11 DIAGNOSIS — I499 Cardiac arrhythmia, unspecified: Secondary | ICD-10-CM | POA: Diagnosis not present

## 2023-11-11 DIAGNOSIS — E876 Hypokalemia: Secondary | ICD-10-CM | POA: Diagnosis not present

## 2023-11-11 DIAGNOSIS — Z23 Encounter for immunization: Secondary | ICD-10-CM | POA: Diagnosis not present

## 2023-11-11 DIAGNOSIS — Z91199 Patient's noncompliance with other medical treatment and regimen due to unspecified reason: Secondary | ICD-10-CM

## 2023-11-11 DIAGNOSIS — G629 Polyneuropathy, unspecified: Secondary | ICD-10-CM | POA: Diagnosis not present

## 2023-11-11 DIAGNOSIS — F10930 Alcohol use, unspecified with withdrawal, uncomplicated: Secondary | ICD-10-CM | POA: Diagnosis not present

## 2023-11-11 DIAGNOSIS — D539 Nutritional anemia, unspecified: Secondary | ICD-10-CM | POA: Diagnosis not present

## 2023-11-11 DIAGNOSIS — G40509 Epileptic seizures related to external causes, not intractable, without status epilepticus: Secondary | ICD-10-CM | POA: Diagnosis present

## 2023-11-11 DIAGNOSIS — R569 Unspecified convulsions: Secondary | ICD-10-CM | POA: Diagnosis not present

## 2023-11-11 DIAGNOSIS — Z811 Family history of alcohol abuse and dependence: Secondary | ICD-10-CM

## 2023-11-11 DIAGNOSIS — F259 Schizoaffective disorder, unspecified: Secondary | ICD-10-CM | POA: Diagnosis present

## 2023-11-11 DIAGNOSIS — F10939 Alcohol use, unspecified with withdrawal, unspecified: Secondary | ICD-10-CM

## 2023-11-11 DIAGNOSIS — K760 Fatty (change of) liver, not elsewhere classified: Secondary | ICD-10-CM | POA: Diagnosis not present

## 2023-11-11 DIAGNOSIS — R0602 Shortness of breath: Secondary | ICD-10-CM | POA: Diagnosis not present

## 2023-11-11 DIAGNOSIS — Z818 Family history of other mental and behavioral disorders: Secondary | ICD-10-CM

## 2023-11-11 DIAGNOSIS — K219 Gastro-esophageal reflux disease without esophagitis: Secondary | ICD-10-CM | POA: Diagnosis present

## 2023-11-11 DIAGNOSIS — Z79899 Other long term (current) drug therapy: Secondary | ICD-10-CM | POA: Diagnosis not present

## 2023-11-11 DIAGNOSIS — F4310 Post-traumatic stress disorder, unspecified: Secondary | ICD-10-CM | POA: Diagnosis present

## 2023-11-11 DIAGNOSIS — Z8249 Family history of ischemic heart disease and other diseases of the circulatory system: Secondary | ICD-10-CM | POA: Diagnosis not present

## 2023-11-11 DIAGNOSIS — R404 Transient alteration of awareness: Secondary | ICD-10-CM | POA: Diagnosis not present

## 2023-11-11 DIAGNOSIS — F17210 Nicotine dependence, cigarettes, uncomplicated: Secondary | ICD-10-CM | POA: Diagnosis present

## 2023-11-11 DIAGNOSIS — K7460 Unspecified cirrhosis of liver: Secondary | ICD-10-CM | POA: Diagnosis not present

## 2023-11-11 DIAGNOSIS — F32A Depression, unspecified: Secondary | ICD-10-CM | POA: Diagnosis present

## 2023-11-11 DIAGNOSIS — E871 Hypo-osmolality and hyponatremia: Secondary | ICD-10-CM | POA: Diagnosis not present

## 2023-11-11 DIAGNOSIS — M109 Gout, unspecified: Secondary | ICD-10-CM | POA: Diagnosis not present

## 2023-11-11 DIAGNOSIS — G40909 Epilepsy, unspecified, not intractable, without status epilepticus: Secondary | ICD-10-CM | POA: Diagnosis present

## 2023-11-11 LAB — CBC WITH DIFFERENTIAL/PLATELET
Abs Immature Granulocytes: 0.04 10*3/uL (ref 0.00–0.07)
Basophils Absolute: 0.1 10*3/uL (ref 0.0–0.1)
Basophils Relative: 1 %
Eosinophils Absolute: 0 10*3/uL (ref 0.0–0.5)
Eosinophils Relative: 0 %
HCT: 47.4 % (ref 39.0–52.0)
Hemoglobin: 16.3 g/dL (ref 13.0–17.0)
Immature Granulocytes: 0 %
Lymphocytes Relative: 15 %
Lymphs Abs: 1.4 10*3/uL (ref 0.7–4.0)
MCH: 34.5 pg — ABNORMAL HIGH (ref 26.0–34.0)
MCHC: 34.4 g/dL (ref 30.0–36.0)
MCV: 100.2 fL — ABNORMAL HIGH (ref 80.0–100.0)
Monocytes Absolute: 0.7 10*3/uL (ref 0.1–1.0)
Monocytes Relative: 8 %
Neutro Abs: 6.9 10*3/uL (ref 1.7–7.7)
Neutrophils Relative %: 76 %
Platelets: 193 10*3/uL (ref 150–400)
RBC: 4.73 MIL/uL (ref 4.22–5.81)
RDW: 16.7 % — ABNORMAL HIGH (ref 11.5–15.5)
WBC: 9.1 10*3/uL (ref 4.0–10.5)
nRBC: 0 % (ref 0.0–0.2)

## 2023-11-11 LAB — BASIC METABOLIC PANEL
Anion gap: 12 (ref 5–15)
BUN: 18 mg/dL (ref 6–20)
CO2: 32 mmol/L (ref 22–32)
Calcium: 8.5 mg/dL — ABNORMAL LOW (ref 8.9–10.3)
Chloride: 90 mmol/L — ABNORMAL LOW (ref 98–111)
Creatinine, Ser: 1.15 mg/dL (ref 0.61–1.24)
GFR, Estimated: >60 mL/min (ref >60)
Glucose, Bld: 89 mg/dL (ref 70–99)
Potassium: 3.1 mmol/L — ABNORMAL LOW (ref 3.5–5.1)
Sodium: 134 mmol/L — ABNORMAL LOW (ref 135–145)

## 2023-11-11 LAB — COMPREHENSIVE METABOLIC PANEL
ALT: 55 U/L — ABNORMAL HIGH (ref 0–44)
AST: 141 U/L — ABNORMAL HIGH (ref 15–41)
Albumin: 3.5 g/dL (ref 3.5–5.0)
Alkaline Phosphatase: 139 U/L — ABNORMAL HIGH (ref 38–126)
Anion gap: 22 — ABNORMAL HIGH (ref 5–15)
BUN: 17 mg/dL (ref 6–20)
CO2: 27 mmol/L (ref 22–32)
Calcium: 8.9 mg/dL (ref 8.9–10.3)
Chloride: 82 mmol/L — ABNORMAL LOW (ref 98–111)
Creatinine, Ser: 1.22 mg/dL (ref 0.61–1.24)
GFR, Estimated: >60 mL/min (ref >60)
Glucose, Bld: 177 mg/dL — ABNORMAL HIGH (ref 70–99)
Potassium: 2.7 mmol/L — CL (ref 3.5–5.1)
Sodium: 131 mmol/L — ABNORMAL LOW (ref 135–145)
Total Bilirubin: 4.8 mg/dL — ABNORMAL HIGH (ref 0.0–1.2)
Total Protein: 7 g/dL (ref 6.5–8.1)

## 2023-11-11 LAB — RAPID URINE DRUG SCREEN, HOSP PERFORMED
Amphetamines: NOT DETECTED
Barbiturates: NOT DETECTED
Benzodiazepines: POSITIVE — AB
Cocaine: NOT DETECTED
Opiates: NOT DETECTED
Tetrahydrocannabinol: POSITIVE — AB

## 2023-11-11 LAB — PROTIME-INR
INR: 1.1 (ref 0.8–1.2)
Prothrombin Time: 14.1 s (ref 11.4–15.2)

## 2023-11-11 LAB — AMMONIA: Ammonia: 76 umol/L — ABNORMAL HIGH (ref 9–35)

## 2023-11-11 LAB — ETHANOL: Alcohol, Ethyl (B): <10 mg/dL (ref <10)

## 2023-11-11 LAB — GLUCOSE, CAPILLARY: Glucose-Capillary: 128 mg/dL — ABNORMAL HIGH (ref 70–99)

## 2023-11-11 LAB — CBG MONITORING, ED: Glucose-Capillary: 147 mg/dL — ABNORMAL HIGH (ref 70–99)

## 2023-11-11 LAB — MAGNESIUM: Magnesium: 1.6 mg/dL — ABNORMAL LOW (ref 1.7–2.4)

## 2023-11-11 MED ORDER — THIAMINE HCL 100 MG/ML IJ SOLN
100.0000 mg | Freq: Every day | INTRAMUSCULAR | Status: DC
  Administered 2023-11-12 – 2023-11-13 (×2): 100 mg via INTRAVENOUS
  Filled 2023-11-11 (×3): qty 2

## 2023-11-11 MED ORDER — POTASSIUM CHLORIDE CRYS ER 20 MEQ PO TBCR
40.0000 meq | EXTENDED_RELEASE_TABLET | Freq: Once | ORAL | Status: AC
  Administered 2023-11-11: 40 meq via ORAL
  Filled 2023-11-11: qty 2

## 2023-11-11 MED ORDER — CHLORDIAZEPOXIDE HCL 25 MG PO CAPS
50.0000 mg | ORAL_CAPSULE | Freq: Once | ORAL | Status: AC
  Administered 2023-11-11: 50 mg via ORAL
  Filled 2023-11-11: qty 2

## 2023-11-11 MED ORDER — ACETAMINOPHEN 650 MG RE SUPP: 650.0000 mg | Freq: Four times a day (QID) | RECTAL | Status: DC | PRN

## 2023-11-11 MED ORDER — CHLORDIAZEPOXIDE HCL 5 MG PO CAPS
25.0000 mg | ORAL_CAPSULE | Freq: Four times a day (QID) | ORAL | Status: AC
  Administered 2023-11-11 – 2023-11-12 (×6): 25 mg via ORAL
  Filled 2023-11-11 (×5): qty 5
  Filled 2023-11-11: qty 1
  Filled 2023-11-11: qty 5

## 2023-11-11 MED ORDER — CHLORDIAZEPOXIDE HCL 5 MG PO CAPS
25.0000 mg | ORAL_CAPSULE | Freq: Three times a day (TID) | ORAL | Status: AC
  Administered 2023-11-12 – 2023-11-13 (×3): 25 mg via ORAL
  Filled 2023-11-11 (×3): qty 5

## 2023-11-11 MED ORDER — SODIUM CHLORIDE 0.9 % IV SOLN: INTRAVENOUS | Status: DC

## 2023-11-11 MED ORDER — BISACODYL 5 MG PO TBEC: 5.0000 mg | DELAYED_RELEASE_TABLET | Freq: Every day | ORAL | Status: DC | PRN

## 2023-11-11 MED ORDER — FOLIC ACID 1 MG PO TABS
1.0000 mg | ORAL_TABLET | Freq: Every day | ORAL | Status: DC
  Administered 2023-11-11 – 2023-11-15 (×5): 1 mg via ORAL
  Filled 2023-11-11 (×5): qty 1

## 2023-11-11 MED ORDER — PANTOPRAZOLE SODIUM 40 MG PO TBEC
40.0000 mg | DELAYED_RELEASE_TABLET | Freq: Every day | ORAL | Status: DC
  Administered 2023-11-12 – 2023-11-15 (×4): 40 mg via ORAL
  Filled 2023-11-11 (×4): qty 1

## 2023-11-11 MED ORDER — ALLOPURINOL 100 MG PO TABS
100.0000 mg | ORAL_TABLET | Freq: Every day | ORAL | Status: DC
  Administered 2023-11-12 – 2023-11-15 (×4): 100 mg via ORAL
  Filled 2023-11-11 (×4): qty 1

## 2023-11-11 MED ORDER — CHLORDIAZEPOXIDE HCL 5 MG PO CAPS
25.0000 mg | ORAL_CAPSULE | Freq: Every day | ORAL | Status: DC
  Filled 2023-11-11: qty 5

## 2023-11-11 MED ORDER — SERTRALINE HCL 25 MG PO TABS
25.0000 mg | ORAL_TABLET | Freq: Every day | ORAL | Status: DC
  Administered 2023-11-12 – 2023-11-15 (×4): 25 mg via ORAL
  Filled 2023-11-11 (×4): qty 1

## 2023-11-11 MED ORDER — HYDRALAZINE HCL 20 MG/ML IJ SOLN: 10.0000 mg | Freq: Four times a day (QID) | INTRAMUSCULAR | Status: DC | PRN

## 2023-11-11 MED ORDER — CHLORDIAZEPOXIDE HCL 5 MG PO CAPS
25.0000 mg | ORAL_CAPSULE | ORAL | Status: AC
  Administered 2023-11-14 (×2): 25 mg via ORAL
  Filled 2023-11-11 (×2): qty 5

## 2023-11-11 MED ORDER — ALBUTEROL SULFATE (2.5 MG/3ML) 0.083% IN NEBU: 2.5000 mg | INHALATION_SOLUTION | Freq: Four times a day (QID) | RESPIRATORY_TRACT | Status: DC | PRN

## 2023-11-11 MED ORDER — QUETIAPINE FUMARATE 100 MG PO TABS
200.0000 mg | ORAL_TABLET | Freq: Every day | ORAL | Status: DC
  Administered 2023-11-11 – 2023-11-14 (×4): 200 mg via ORAL
  Filled 2023-11-11 (×5): qty 2

## 2023-11-11 MED ORDER — POLYETHYLENE GLYCOL 3350 17 G PO PACK: 17.0000 g | PACK | Freq: Every day | ORAL | Status: DC | PRN

## 2023-11-11 MED ORDER — ACETAMINOPHEN 325 MG PO TABS: 650.0000 mg | ORAL_TABLET | Freq: Four times a day (QID) | ORAL | Status: DC | PRN

## 2023-11-11 MED ORDER — LORAZEPAM 1 MG PO TABS
1.0000 mg | ORAL_TABLET | ORAL | Status: AC | PRN
  Administered 2023-11-11 (×4): 1 mg via ORAL
  Administered 2023-11-14: 2 mg via ORAL
  Filled 2023-11-11 (×4): qty 1
  Filled 2023-11-11: qty 2

## 2023-11-11 MED ORDER — THIAMINE MONONITRATE 100 MG PO TABS
100.0000 mg | ORAL_TABLET | Freq: Every day | ORAL | Status: DC
  Administered 2023-11-11 – 2023-11-15 (×3): 100 mg via ORAL
  Filled 2023-11-11 (×3): qty 1

## 2023-11-11 MED ORDER — ONDANSETRON 4 MG PO TBDP: 4.0000 mg | ORAL_TABLET | Freq: Four times a day (QID) | ORAL | Status: AC | PRN

## 2023-11-11 MED ORDER — MAGNESIUM SULFATE 2 GM/50ML IV SOLN
2.0000 g | Freq: Once | INTRAVENOUS | Status: AC
  Administered 2023-11-11: 2 g via INTRAVENOUS
  Filled 2023-11-11: qty 50

## 2023-11-11 MED ORDER — ADULT MULTIVITAMIN W/MINERALS CH
1.0000 | ORAL_TABLET | Freq: Every day | ORAL | Status: DC
  Administered 2023-11-11 – 2023-11-15 (×5): 1 via ORAL
  Filled 2023-11-11 (×5): qty 1

## 2023-11-11 MED ORDER — GABAPENTIN 300 MG PO CAPS
300.0000 mg | ORAL_CAPSULE | Freq: Three times a day (TID) | ORAL | Status: DC
  Administered 2023-11-11 – 2023-11-15 (×12): 300 mg via ORAL
  Filled 2023-11-11 (×12): qty 1

## 2023-11-11 MED ORDER — ENOXAPARIN SODIUM 40 MG/0.4ML IJ SOSY
40.0000 mg | PREFILLED_SYRINGE | INTRAMUSCULAR | Status: DC
  Administered 2023-11-11 – 2023-11-14 (×4): 40 mg via SUBCUTANEOUS
  Filled 2023-11-11 (×4): qty 0.4

## 2023-11-11 MED ORDER — HYDROXYZINE HCL 25 MG PO TABS
25.0000 mg | ORAL_TABLET | Freq: Four times a day (QID) | ORAL | Status: AC | PRN
  Administered 2023-11-14: 25 mg via ORAL
  Filled 2023-11-11: qty 1

## 2023-11-11 MED ORDER — LORAZEPAM 2 MG/ML IJ SOLN
1.0000 mg | INTRAMUSCULAR | Status: AC | PRN
  Administered 2023-11-12 (×3): 2 mg via INTRAVENOUS
  Filled 2023-11-11 (×3): qty 1

## 2023-11-11 MED ORDER — LORAZEPAM 2 MG/ML IJ SOLN
2.0000 mg | Freq: Once | INTRAMUSCULAR | Status: AC
  Administered 2023-11-11: 2 mg via INTRAVENOUS
  Filled 2023-11-11: qty 1

## 2023-11-11 MED ORDER — LOPERAMIDE HCL 2 MG PO CAPS: 2.0000 mg | ORAL_CAPSULE | ORAL | Status: AC | PRN

## 2023-11-11 MED ORDER — THIAMINE HCL 100 MG/ML IJ SOLN
100.0000 mg | Freq: Once | INTRAMUSCULAR | Status: AC
  Administered 2023-11-11: 100 mg via INTRAVENOUS
  Filled 2023-11-11: qty 2

## 2023-11-11 MED ORDER — POTASSIUM CHLORIDE 10 MEQ/100ML IV SOLN
10.0000 meq | INTRAVENOUS | Status: AC
  Administered 2023-11-11 (×3): 10 meq via INTRAVENOUS
  Filled 2023-11-11 (×3): qty 100

## 2023-11-11 MED ORDER — CHLORDIAZEPOXIDE HCL 5 MG PO CAPS: 25.0000 mg | ORAL_CAPSULE | Freq: Four times a day (QID) | ORAL | Status: AC | PRN

## 2023-11-11 MED ORDER — LEVETIRACETAM 500 MG PO TABS
500.0000 mg | ORAL_TABLET | Freq: Two times a day (BID) | ORAL | Status: DC
  Administered 2023-11-11 – 2023-11-15 (×8): 500 mg via ORAL
  Filled 2023-11-11 (×8): qty 1

## 2023-11-11 MED ORDER — LACTATED RINGERS IV BOLUS
1000.0000 mL | Freq: Once | INTRAVENOUS | Status: AC
  Administered 2023-11-11: 1000 mL via INTRAVENOUS

## 2023-11-11 NOTE — Discharge Instructions (Addendum)
 Outpatient Substance Abuse  Treatment- uninsured  Narcotics Anonymous 24-HOUR HELPLINE Pre-recorded for Meeting Schedules [facility] [phone]  [email or web address] ALCOHOLICS ANONYMOUS  [facility]  Answering Service [phone] Please Note: All [place] Meetings are Non-smoking [email or web address].es  Alcohol and Drug Services -  Insurance: Medicaid /State funding/private insurance Methadone, suboxone/Intensive outpatient   [facility]   [phone] Fax: [phone] [address], [place], [place], [address] [place] [phone] Fax: [phone]    [address] [place], [place], [address] ([address] [place], [place], [place], [place], [place], [place], [place], [place]) [facility] [email or web address] Accepts State funding/Medicaid Transitional housing, Intensive Outpatient Treatment, Outpatient treatment, Veterans Services  Phone: [phone] Fax: [phone] Address: [address], [place] [address]   [employer] [employer] [employer] [facility] ([email or web address].[email or web address]/) Insurance: Medicaid Case Management, Administrator, arts, Medication Management, Outpatient Therapy, Psychosocial Rehabilitation, Substance Abuse Intensive Outpatient  Phone: [phone] Fax: [phone] [address], [place], [place], [address]  [facility] Medicaid, most private insurance providers Types of Program: Individual/Group Therapy, Substance Abuse Treatment  Phone: [place] [phone] Fax: [phone] [address], [place], [place], [address] [place] [phone] [address] [place], [place], [address]  [facility]  Medicaid Types of Program: SAIOP  Phone: [phone] Fax: [phone] [address] [place], [place], [place], [address] [facility] Medicaid/state funds Crisis line [phone] [facility] [phone] [place] [phone]  [place] [phone]  [facility] [address];  [place], [place] [address] [phone]  Substance Abuse Intensive Outpatient Program [facility] [address] [place], [place] [address] [phone]- Substance abuse treatment  Successful Transitions  Insurance: [facility], [address], sliding scale Types of Program: substance abuse treatment, transportation assistance Phone: [phone] Fax: [phone] Address: [address], [place] [address] [facility] (TrendSwap[employer][email or web address]) Insurance: UHC, [place], [place], [place] Program: addiction counseling, detoxification,  Phone: [phone]  Fax: [phone] Address: [address] [place], [place] [address]  [facility] (statewide facilities/programs) [address] (Medicaid/state funds) [place], [place] [address]                      [email or web address]/ [phone] Marcy Panning- [phone] [place]- [phone] [facility] [place] (2 Locations) (Medicaid/state funds) --[address]  walk in 8:30-12 and 1-2:30 [id number]   [facility]- [phone] --[address] [place], [place] [address]  [phone]-[phone] walk in 8:30-12 and 2-3:30  [facility] state funds/medicaid [address] [place], [place] [address] [phone] [facility] (Suboxone clinic) Medicaid/state funds  [address]  [place], [place] [address] [phone]   [facility]  [address], [place], [place] [address]  [phone] (24 hours) [place]- [address] [place], [place] [address]  [phone] (24 hours) [place]- [address] Brooke [place] [phone] [place]- [address] Rosalita Levan [phone] Turner Daniels [address] [place] [phone] [facility]- Medicaid and state funds  [place]- [address] [place], [place] [address] [phone] (24 hours) [place]- [address] [place],  [place] [address] [phone] [facility]- [address] [place], [place] [address] [phone] (24 hours) [place] [address]  [address] [place], [place]  [address] [phone] [place]- [address] [place] [phone]      Follow with Primary MD Georganna Skeans, MD in 7 days, get a referral for gastroenterologist within a week of discharge.  Get CBC, CMP, 2 view Chest X ray -  checked next visit with your primary M   Activity: As tolerated with Full fall precautions use walker/cane & assistance as needed  Disposition Home    Diet: Heart Healthy   Special Instructions: If you have smoked or chewed Tobacco  in the last 2 yrs please stop smoking, stop any regular Alcohol  and or any Recreational drug use.  On your next visit with your primary care physician please Get Medicines reviewed and adjusted.  Please request your Prim.MD to go over all Hospital Tests and Procedure/Radiological results at the follow up, please get all Hospital records sent to your Prim MD by signing hospital release before you go home.  If you experience worsening of your admission symptoms, develop shortness of breath, life threatening emergency, suicidal or homicidal thoughts you must seek medical attention immediately by calling 911 or calling your MD immediately  if symptoms less severe.  You Must read complete instructions/literature along with all the possible adverse reactions/side effects for all the Medicines you take and that have been prescribed to you. Take any new Medicines after you have completely understood and accpet all the possible adverse reactions/side effects.   Do not drive when taking Pain medications.  Do not take more than prescribed Pain, Sleep and Anxiety Medications  Wear Seat belts while driving.

## 2023-11-11 NOTE — ED Triage Notes (Addendum)
 Pt BIBGEMS from the vape shop, pt drove there and workers witnessed seizure like activity. Last drink 2-3 days ago , drinks heavy. Confused initial and now alert and oriented. Reports not eating or drinking for a week. Pt reports that he has not been taking his antipsychotics and has been self medicating.  91% RA 116/palp

## 2023-11-11 NOTE — H&P (Signed)
 [facility] History and Physical  [name]:[id number] DOB: Dec 27, 1970 DOA: 11/11/2023 PCP: Georganna Skeans, MD  Presented from: Home Chief Complaint: Seizure  History of Present Illness: [name] is a 53 y.o. male with chronic alcoholism, withdrawal seizures, hepatic steatosis, chronic anemia, schizoaffective disorder, anxiety, depression, PTSD Patient was brought to the ED this morning by EMS for a witnessed seizure activity. Patient was at a store and suddenly had a seizure activity witnessed by the workers there.  EMS was called and patient was brought to the ED.  No mention of bowel or bladder incontinence.  Patient faintly remembers being on the ambulance.  In the ED, patient was initially altered but gradually woke up. He was afebrile, heart rate in 90s, blood pressure in low normal range, breathing on room air Labs showed WC count 9.1, hemoglobin 16.3, platelet 193, sodium 139, potassium 2.7, magnesium 1.6, BUN/creatinine 17/1.22, AST/ALT/alk phos/total bili elevated to 141/55/139/4.8 Blood alcohol level not elevated CT head unremarkable Chest x-ray unremarkable EKG with sinus rhythm at 97 bpm, QTc prolonged at 539 milliseconds.  Patient was given IV Ativan, IV fluid, potassium and magnesium supplementation Hospitalist service consulted for inpatient management.  At the time of my evaluation, patient was propped up in bed.  Not in distress.  Slow to respond.  Alert, awake, oriented x 3.  No family at bedside. Used to live with his mother who is now in a memory care unit.  Lives alone with a dog alone at home with her dog. Continues to drink heavy.  Drinks about 5 of the tall boys of 15% alcohol daily.  Last drink yesterday morning. Patient reports to me that he has been taking his Keppra and all other medications but earlier he told the ED provider that he has not been taking them.    Review of Systems:  All systems were reviewed and were negative unless  otherwise mentioned in the HPI   Past medical history: Past Medical History:  Diagnosis Date   Alcohol abuse    Anxiety    at age 4   Blood transfusion without reported diagnosis    Cirrhosis (HCC)    Depression    at age 6   GERD (gastroesophageal reflux disease)    Hypertension    Nausea and vomiting    Psychosis (HCC)    PTSD (post-traumatic stress disorder)    Schizoaffective disorder    Seizure disorder (HCC)    related to etoh seizure   Symptomatic anemia     Past surgical history: Past Surgical History:  Procedure Laterality Date   ESOPHAGOGASTRODUODENOSCOPY (EGD) WITH PROPOFOL N/A 05/05/2020   Procedure: ESOPHAGOGASTRODUODENOSCOPY (EGD) WITH PROPOFOL;  Surgeon: Napoleon Form, MD;  Location: [facility];  Service: Endoscopy;  Laterality: N/A;   FOOT SURGERY     right   HAND SURGERY      Social History:  reports that he has been smoking cigarettes. He has a 45 pack-year smoking history. He has never used smokeless tobacco. He reports current alcohol use. He reports current drug use. Frequency: 1.00 time per week. Drug: Marijuana.  Allergies:  Allergies  Allergen Reactions   Bupropion Other (See Comments)    Caused seizures Caused seizures Feel as though I will have a seizure when taking this med   Bupropion   Family history:  Family History  Problem Relation Age of Onset   Alcoholism Mother    CAD Father    Depression Brother    Colon polyps Neg Hx  Esophageal cancer Neg Hx    Pancreatic cancer Neg Hx    Stomach cancer Neg Hx      Physical Exam: Vitals:   11/11/23 1000 11/11/23 1151 11/11/23 1200 11/11/23 1319  BP: 106/73 115/78 123/82 108/71  Pulse: 85 80 82 96  Resp: 17 (!) 26 17 20   Temp:    98 F (36.7 C)  TempSrc:    Oral  SpO2: 95% 96% 98% 93%  Weight:      Height:       Wt Readings from Last 3 Encounters:  11/11/23 99.8 kg  03/25/23 112.3 kg  03/05/23 99.8 kg   Body mass index is 29.84 kg/m.  General exam:  Pleasant, middle-aged Caucasian male.  Looks older for his stated age Skin: No rashes, lesions or ulcers. HEENT: Atraumatic, normocephalic, no obvious bleeding Lungs: Clear to auscultation bilaterally,  CVS: S1, S2, no murmur,   GI/Abd: Soft, nontender, distended from obesity, bowel sound present,   CNS: Alert, awake, oriented x 3, slow to respond Psychiatry: Sad affect Extremities: No pedal edema, no calf tenderness   ----------------------------------------------------------------------------------------------------------------------------------------- ----------------------------------------------------------------------------------------------------------------------------------------- -----------------------------------------------------------------------------------------------------------------------------------------  Assessment/Plan: Active Problems:   Alcohol withdrawal (HCC)  Alcohol withdrawal seizure Brought into the ED after witnessed seizure-like activity Given IV Ativan in the ED Mental status gradually improved Unsure if he is compliant to Keppra.  Resume home dose at this time. Start on CIWA protocol with scheduled Librium and as needed Ativan Continue to monitor  Chronic alcoholism Hepatic steatosis AST, ALT, alk phos, total bilirubin elevated Chart reviewed for abdominal imagings.  Patient has hepatic steatosis, not sure if he already has liver cirrhosis but definitely at risk of that. Counseled to quit alcohol Trend LFTs and bilirubin. Recent Labs  Lab 11/11/23 0840  AST 141*  ALT 55*  ALKPHOS 139*  BILITOT 4.8*  PROT 7.0  ALBUMIN 3.5    Hypokalemia/hypomagnesemia Potassium and magnesium levels were low.  Replacement given. Check phosphorus level as well Recent Labs  Lab 11/11/23 0840  K 2.7*  MG 1.6*   Hyponatremia Likely due to heavy alcohol use.  Continue to monitor on IV fluid Recent Labs  Lab 11/11/23 0840  NA 131*   Chronic  macrocytic anemia Macrocytosis due to alcohol use.  Vitamin B12 and folic acid level adequate Hemoglobin elevated today likely due to hemoconcentration Continue to monitor Recent Labs    03/09/23 0330 03/10/23 0249 03/11/23 0226 03/11/23 1147 03/12/23 0616 11/11/23 0840  HGB 10.8* 10.6* 10.1*  --  10.3* 16.3  MCV 100.6* 102.7* 102.7*  --  102.7* 100.2*  VITAMINB12  --   --   --  815  --   --   FOLATE  --   --  6.4  --   --   --   TIBC  --   --   --  231*  --   --   IRON  --   --   --  103  --   --    Psychiatric disorders schizoaffective disorder, anxiety, depression, PTSD Unsure compliance but PTA med list shows Seroquel, sertraline Resume both  Gout Allopurinol  Neuropathy Gabapentin  GERD PPI  Mobility: States he has had falls lately.  PT eval ordered  Goals of care:   Code Status: Full Code    DVT prophylaxis:  enoxaparin (LOVENOX) injection 40 mg Start: 11/11/23 1130   Antimicrobials: None Fluid: NS at 75 mL/h Consultants: None Family Communication: None at bedside  Status: Observation Level of care:  Progressive  Patient is from: Home Anticipated d/c to: Pending clinical course  Diet: Diet Order             Diet regular Room service appropriate? Yes; Fluid consistency: Thin  Diet effective now                    ------------------------------------------------------------------------------------- Severity of Illness: The appropriate patient status for this patient is OBSERVATION. Observation status is judged to be reasonable and necessary in order to provide the required intensity of service to ensure the patient's safety. The patient's presenting symptoms, physical exam findings, and initial radiographic and laboratory data in the context of their medical condition is felt to place them at decreased risk for further clinical deterioration. Furthermore, it is anticipated that the patient will be medically stable for discharge from the  hospital within 2 midnights of admission.  -------------------------------------------------------------------------------------   Home Meds: Prior to Admission medications   Medication Sig Start Date End Date Taking? Authorizing Provider  allopurinol (ZYLOPRIM) 100 MG tablet Take 1 tablet (100 mg total) by mouth daily. 03/12/23 03/11/24 Yes Gillis Santa, MD  gabapentin (NEURONTIN) 300 MG capsule Take 1 capsule (300 mg total) by mouth 3 (three) times daily. 03/12/23 03/11/24 Yes Gillis Santa, MD  levETIRAcetam (KEPPRA) 500 MG tablet Take 1 tablet (500 mg total) by mouth 2 (two) times daily. 03/12/23 03/11/24 Yes Gillis Santa, MD  metoprolol succinate (TOPROL-XL) 25 MG 24 hr tablet Take 1 tablet (25 mg total) by mouth daily. Hold if SBP <110 mmHg and or HR <65 03/12/23 03/11/24 Yes Gillis Santa, MD  pantoprazole (PROTONIX) 40 MG tablet Take 1 tablet (40 mg total) by mouth daily. 03/12/23 03/11/24 Yes Gillis Santa, MD  QUEtiapine (SEROQUEL) 200 MG tablet Take 1 tablet (200 mg total) by mouth at bedtime. 03/12/23 03/11/24 Yes Gillis Santa, MD  sertraline (ZOLOFT) 25 MG tablet Take 1 tablet (25 mg total) by mouth daily. 03/12/23 03/11/24 Yes Gillis Santa, MD  spironolactone (ALDACTONE) 100 MG tablet Take 1 tablet (100 mg total) by mouth daily. Skip the dose if systolic BP less than 120 mmHg 03/12/23  Yes Gillis Santa, MD    Labs on Admission:   CBC: Recent Labs  Lab 11/11/23 0840  WBC 9.1  NEUTROABS 6.9  HGB 16.3  HCT 47.4  MCV 100.2*  PLT 193    Basic Metabolic Panel: Recent Labs  Lab 11/11/23 0840  NA 131*  K 2.7*  CL 82*  CO2 27  GLUCOSE 177*  BUN 17  CREATININE 1.22  CALCIUM 8.9  MG 1.6*    Liver Function Tests: Recent Labs  Lab 11/11/23 0840  AST 141*  ALT 55*  ALKPHOS 139*  BILITOT 4.8*  PROT 7.0  ALBUMIN 3.5   No results for input(s): "LIPASE", "AMYLASE" in the last 168 hours. Recent Labs  Lab 11/11/23 0840  AMMONIA 76*    Cardiac Enzymes: No results for  input(s): "CKTOTAL", "CKMB", "CKMBINDEX", "TROPONINI" in the last 168 hours.  BNP (last 3 results) Recent Labs    03/08/23 0652 03/09/23 0330 03/10/23 0249  BNP 249.7* 102.5* 42.2    ProBNP (last 3 results) No results for input(s): "PROBNP" in the last 8760 hours.  CBG: Recent Labs  Lab 11/11/23 0925  GLUCAP 147*    Lipase     Component Value Date/Time   LIPASE 36 03/05/2023 0034     Urinalysis    Component Value Date/Time   COLORURINE AMBER (A) 01/10/2022 0124   APPEARANCEUR CLEAR 01/10/2022 0124  LABSPEC 1.017 01/10/2022 0124   PHURINE 6.0 01/10/2022 0124   GLUCOSEU NEGATIVE 01/10/2022 0124   HGBUR NEGATIVE 01/10/2022 0124   BILIRUBINUR NEGATIVE 01/10/2022 0124   KETONESUR 5 (A) 01/10/2022 0124   PROTEINUR 100 (A) 01/10/2022 0124   UROBILINOGEN 1.0 12/24/2014 0643   NITRITE NEGATIVE 01/10/2022 0124   LEUKOCYTESUR NEGATIVE 01/10/2022 0124     Drugs of Abuse     Component Value Date/Time   LABOPIA NONE DETECTED 03/06/2023 0939   COCAINSCRNUR NONE DETECTED 03/06/2023 0939   LABBENZ POSITIVE (A) 03/06/2023 0939   AMPHETMU NONE DETECTED 03/06/2023 0939   THCU POSITIVE (A) 03/06/2023 0939   LABBARB NONE DETECTED 03/06/2023 0939      Radiological Exams on Admission: DG Chest Portable 1 View Result Date: 11/11/2023 CLINICAL DATA:  Shortness of breath EXAM: PORTABLE CHEST 1 VIEW COMPARISON:  07/23/2020 FINDINGS: The heart size and mediastinal contours are within normal limits. Both lungs are clear. The visualized skeletal structures are unremarkable. IMPRESSION: Normal study. Electronically Signed   By: Charlett Nose M.D.   On: 11/11/2023 10:31   CT HEAD WO CONTRAST ( ) Result Date: 11/11/2023 CLINICAL DATA:  Seizure, new-onset, no history of trauma. EXAM: CT HEAD WITHOUT CONTRAST TECHNIQUE: Contiguous axial images were obtained from the base of the skull through the vertex without intravenous contrast. RADIATION DOSE REDUCTION: This exam was performed according  to the departmental dose-optimization program which includes automated exposure control, adjustment of the mA and/or kV according to patient size and/or use of iterative reconstruction technique. COMPARISON:  Head CT 03/07/2023 FINDINGS: Brain: There is no evidence of an acute infarct, intracranial hemorrhage, mass, midline shift, or extra-axial fluid collection. Mild cerebral atrophy is unchanged. Vascular: No suspicious focal vascular hyperdensity. Skull: No fracture or suspicious lesion. Sinuses/Orbits: Visualized paranasal sinuses and mastoid air cells are clear. Unremarkable included orbits. Other: None. IMPRESSION: No evidence of acute intracranial abnormality. Electronically Signed   By: Sebastian Ache M.D.   On: 11/11/2023 10:17     Signed, Lorin Glass, MD [facility] 11/11/2023

## 2023-11-11 NOTE — Progress Notes (Signed)
 CSW added substance abuse resources to patient's AVS.  Edwin Dada, MSW, LCSW Transitions of Care  Clinical Social Worker II [phone]

## 2023-11-11 NOTE — TOC Initial Note (Signed)
 Transition of Care [facility]) - Initial/Assessment Note    Patient Details  Name: [name] MRN: [id number] Date of Birth: 1971/01/19  [facility] CM/SW Contact:    Alesia Richards, RN 11/11/2023, 2:21 PM  Clinical Narrative:                  CM to patient's room regarding TOC screening assessment. CM introduced case management role and discharge care planning process. Patient verbalized understanding and agreement with TOC screening interview. Patient states lives alone with 1 dog. Patient states point of contact for discussion of care is sister, Gigi Gin, first and brother, Loraine Leriche, second. Patient states is independent in ADLs and uses a single point cane for assistance. Patient states a vehicle that is parked at a local store. Per patient, sister, Gigi Gin will provide transportation upon discharge. Per patient has previous SNF experience with [facility] for a 2 month duration.  Expected Discharge Plan: Home/Self Care Barriers to Discharge: Continued Medical Work up   Patient Goals and CMS Choice Patient states their goals for this hospitalization and ongoing recovery are:: to feel better     Expected Discharge Plan and Services   Discharge Planning Services: CM Consult     Prior Living Arrangements/Services   Lives with:: Pets, Self (dog--vaccinated) Patient language and need for interpreter reviewed:: No        Need for Family Participation in Patient Care: Yes (Comment) Care giver support system in place?: Yes (comment) Current home services: DME ([facility] [place]) Criminal Activity/Legal Involvement Pertinent to Current Situation/Hospitalization: No - Comment as needed  Activities of Daily Living   ADL Screening (condition at time of admission) Independently performs ADLs?: Yes (appropriate for developmental age) Is the patient deaf or have difficulty hearing?: No Does the patient have difficulty seeing, even when wearing glasses/contacts?: No Does the  patient have difficulty concentrating, remembering, or making decisions?: No  Permission Sought/Granted Permission sought to share information with : Case Manager, Family Supports Permission granted to share information with : Yes, Verbal Permission Granted  Share Information with NAME: Praneel, Haisley     Permission granted to share info w Relationship: sister Gigi Gin), brother Loraine Leriche)     Emotional Assessment Appearance:: Appears older than stated age Attitude/Demeanor/Rapport: Engaged Affect (typically observed): Calm Orientation: : Oriented to Self, Oriented to Place, Oriented to  Time, Oriented to Situation Alcohol / Substance Use: Tobacco Use, Alcohol Use, Illicit Drugs (Patient states last alcohol use is Monday 11/09/2023--drank 5 -24 ounce "Locos"; smokes 1 pack of cigarettes daily; last cigarette use today and last marijuana use was 2 days ago.) Psych Involvement: No (comment)  Admission diagnosis:  Alcohol withdrawal (HCC) [F10.939] Hypokalemia [E87.6] Alcohol withdrawal seizure without complication (HCC) [F10.930, R56.9] Alcohol withdrawal syndrome with complication (HCC) [F10.939] Patient Active Problem List   Diagnosis Date Noted   Hypervolemic hyponatremia 03/05/2023   Hypocalcemia 03/05/2023   Hepatic cirrhosis (HCC) 03/05/2023   History of seizure 03/05/2023   Leukocytosis 03/05/2023   Chronic anemia 03/05/2023   Schizoaffective disorder (HCC) 03/05/2023   Transaminitis 03/05/2023   Elevated bilirubin 03/05/2023   Hypochloremia 03/05/2023   Alcohol withdrawal (HCC) 02/02/2022   Hyperammonemia (HCC) 02/02/2022   Alcohol withdrawal syndrome without complication (HCC) 01/10/2022   Acute gout 01/10/2022   Elevated liver function tests    Nausea and vomiting    Trigger finger, right middle finger 05/20/2021   Alcoholic cirrhosis of liver with ascites (HCC) 03/08/2021   Polysubstance abuse (HCC) 03/08/2021  Secondary esophageal varices without bleeding  (HCC) 03/08/2021   Macrocytic anemia    Hepatic steatosis    Hypomagnesemia 05/03/2020   Hyperbilirubinemia 05/03/2020   Alcoholism (HCC) 05/03/2020   Alcoholic hepatitis without ascites 03/06/2019   Essential hypertension 01/10/2019   Alcohol intoxication (HCC) 01/10/2019   Schizoaffective disorder, bipolar type (HCC) 06/02/2018   Alcohol-induced mood disorder (HCC) 02/22/2016   Gout 10/22/2015   GERD (gastroesophageal reflux disease) 10/22/2015   Schizoaffective disorder, depressive type (HCC)    History of alcohol abuse    Psychosis (HCC) 05/13/2015   Tobacco dependence due to cigarettes 11/20/2014   Abnormal LFTs    Alcohol abuse 02/09/2014   PTSD (post-traumatic stress disorder) 11/03/2011   Hypokalemia 07/02/2011   PCP:  Georganna Skeans, MD Pharmacy:   [facility] [phone] [place], [place] - [address] AT [facility] [address] [place] [address] Phone: [phone] Fax: [phone]  [id number] - [place], [place] - [address] [place] [address] Phone: [phone] Fax: [phone]     Social Drivers of Health (SDOH) Social History: SDOH Screenings   Food Insecurity: Food Insecurity Present (11/11/2023)  Housing: High Risk (11/11/2023)  Transportation Needs: No Transportation Needs (11/11/2023)  Utilities: Not At Risk (11/11/2023)  Alcohol Screen: Medium Risk (06/02/2018)  Depression (PHQ2-9): Medium Risk (01/15/2022)  Tobacco Use: High Risk (11/11/2023)   SDOH Interventions:   Patient has declined substance use recovery programs and stated has participated in substance use recovery programs previously and states "I think I can do well on my own"  Readmission Risk Interventions    09/19/2021   12:21 PM  Readmission Risk Prevention Plan  HRI or Home Care Consult Complete  Social Work Consult for Recovery Care Planning/Counseling Complete  Palliative Care  Screening Not Applicable

## 2023-11-11 NOTE — Plan of Care (Signed)

## 2023-11-11 NOTE — ED Provider Notes (Signed)
 [facility] [facility] AT [facility] Provider Note   CSN: [id number] Arrival date & time: 11/11/23  4098     History  Chief Complaint  Patient presents with   Seizures    [name] is a 53 y.o. male.   Seizures Patient presents with reported seizure activity.  Reportedly was at the store and had a seizure.  Patient does not remember much of it.  Does have a history of seizures related to alcohol withdrawal.  States he drinks about 5 of the tall boys of 15% alcohol a day.  Last drink yesterday morning.  States he feels as if he is withdrawing.  Has not really eaten.  States he is somewhat crampy.  History of cirrhosis.  States he may be seeing some mild hallucinations.    Past Medical History:  Diagnosis Date   Alcohol abuse    Anxiety    at age 34   Blood transfusion without reported diagnosis    Cirrhosis (HCC)    Depression    at age 49   GERD (gastroesophageal reflux disease)    Hypertension    Nausea and vomiting    Psychosis (HCC)    PTSD (post-traumatic stress disorder)    Schizoaffective disorder    Seizure disorder (HCC)    related to etoh seizure   Symptomatic anemia     Home Medications Prior to Admission medications   Medication Sig Start Date End Date Taking? Authorizing Provider  allopurinol (ZYLOPRIM) 100 MG tablet Take 1 tablet (100 mg total) by mouth daily. 03/12/23 03/11/24 Yes Gillis Santa, MD  gabapentin (NEURONTIN) 300 MG capsule Take 1 capsule (300 mg total) by mouth 3 (three) times daily. 03/12/23 03/11/24 Yes Gillis Santa, MD  levETIRAcetam (KEPPRA) 500 MG tablet Take 1 tablet (500 mg total) by mouth 2 (two) times daily. 03/12/23 03/11/24 Yes Gillis Santa, MD  metoprolol succinate (TOPROL-XL) 25 MG 24 hr tablet Take 1 tablet (25 mg total) by mouth daily. Hold if SBP <110 mmHg and or HR <65 03/12/23 03/11/24 Yes Gillis Santa, MD  pantoprazole (PROTONIX) 40 MG tablet Take 1 tablet (40 mg total) by mouth daily. 03/12/23 03/11/24  Yes Gillis Santa, MD  QUEtiapine (SEROQUEL) 200 MG tablet Take 1 tablet (200 mg total) by mouth at bedtime. 03/12/23 03/11/24 Yes Gillis Santa, MD  sertraline (ZOLOFT) 25 MG tablet Take 1 tablet (25 mg total) by mouth daily. 03/12/23 03/11/24 Yes Gillis Santa, MD  spironolactone (ALDACTONE) 100 MG tablet Take 1 tablet (100 mg total) by mouth daily. Skip the dose if systolic BP less than 120 mmHg 03/12/23  Yes Gillis Santa, MD      Allergies    Bupropion    Review of Systems   Review of Systems  Neurological:  Positive for seizures.    Physical Exam Updated Vital Signs BP 106/73   Pulse 85   Temp 98.1 F (36.7 C)   Resp 17   Ht 6' (1.829 m)   Wt 99.8 kg   SpO2 95%   BMI 29.84 kg/m  Physical Exam Vitals and nursing note reviewed.  HENT:     Mouth/Throat:     Comments: Tongue has some residue smoking but no bite. Cardiovascular:     Rate and Rhythm: Regular rhythm.  Chest:     Chest wall: No tenderness.  Abdominal:     Tenderness: There is no abdominal tenderness.  Musculoskeletal:        General: No tenderness.     Cervical  back: Neck supple.  Skin:    General: Skin is warm.  Neurological:     Mental Status: He is alert. Mental status is at baseline.     Comments: Patient tremulous in bilateral upper extremities     ED Results / Procedures / Treatments   Labs (all labs ordered are listed, but only abnormal results are displayed) Labs Reviewed  COMPREHENSIVE METABOLIC PANEL - Abnormal; Notable for the following components:      Result Value   Sodium 131 (*)    Potassium 2.7 (*)    Chloride 82 (*)    Glucose, Bld 177 (*)    AST 141 (*)    ALT 55 (*)    Alkaline Phosphatase 139 (*)    Total Bilirubin 4.8 (*)    Anion gap 22 (*)    All other components within normal limits  CBC WITH DIFFERENTIAL/PLATELET - Abnormal; Notable for the following components:   MCV 100.2 (*)    MCH 34.5 (*)    RDW 16.7 (*)    All other components within normal limits  AMMONIA -  Abnormal; Notable for the following components:   Ammonia 76 (*)    All other components within normal limits  MAGNESIUM - Abnormal; Notable for the following components:   Magnesium 1.6 (*)    All other components within normal limits  CBG MONITORING, ED - Abnormal; Notable for the following components:   Glucose-Capillary 147 (*)    All other components within normal limits  ETHANOL  PROTIME-INR  RAPID URINE DRUG SCREEN, HOSP PERFORMED    EKG EKG Interpretation Date/Time:  Wednesday November 11 2023 08:18:47 EDT Ventricular Rate:  97 PR Interval:  94 QRS Duration:  102 QT Interval:  424 QTC Calculation: 539 R Axis:   68  Text Interpretation: Sinus rhythm Short PR interval Low voltage, precordial leads Prolonged QT interval No significant change since last tracing Confirmed by Benjiman Core [phone]) on 11/11/2023 9:09:06 AM  Radiology DG Chest Portable 1 View Result Date: 11/11/2023 CLINICAL DATA:  Shortness of breath EXAM: PORTABLE CHEST 1 VIEW COMPARISON:  07/23/2020 FINDINGS: The heart size and mediastinal contours are within normal limits. Both lungs are clear. The visualized skeletal structures are unremarkable. IMPRESSION: Normal study. Electronically Signed   By: Charlett Nose M.D.   On: 11/11/2023 10:31   CT HEAD WO CONTRAST ( ) Result Date: 11/11/2023 CLINICAL DATA:  Seizure, new-onset, no history of trauma. EXAM: CT HEAD WITHOUT CONTRAST TECHNIQUE: Contiguous axial images were obtained from the base of the skull through the vertex without intravenous contrast. RADIATION DOSE REDUCTION: This exam was performed according to the departmental dose-optimization program which includes automated exposure control, adjustment of the mA and/or kV according to patient size and/or use of iterative reconstruction technique. COMPARISON:  Head CT 03/07/2023 FINDINGS: Brain: There is no evidence of an acute infarct, intracranial hemorrhage, mass, midline shift, or extra-axial fluid  collection. Mild cerebral atrophy is unchanged. Vascular: No suspicious focal vascular hyperdensity. Skull: No fracture or suspicious lesion. Sinuses/Orbits: Visualized paranasal sinuses and mastoid air cells are clear. Unremarkable included orbits. Other: None. IMPRESSION: No evidence of acute intracranial abnormality. Electronically Signed   By: Sebastian Ache M.D.   On: 11/11/2023 10:17    Procedures Procedures    Medications Ordered in ED Medications  magnesium sulfate IVPB 2 g 50 mL (has no administration in time range)  potassium chloride 10 mEq in 100 mL IVPB (has no administration in time range)  thiamine (VITAMIN B1) injection  100 mg (has no administration in time range)  LORazepam (ATIVAN) injection 2 mg (2 mg Intravenous Given 11/11/23 0929)  lactated ringers bolus 1,000 mL (1,000 mLs Intravenous New Bag/Given 11/11/23 0930)    ED Course/ Medical Decision Making/ A&P                                 Medical Decision Making Amount and/or Complexity of Data Reviewed Labs: ordered. Radiology: ordered.  Risk Prescription drug management.   Patient presents with seizure activity.  Likely due to alcohol withdrawal.  Reportedly is on Keppra and has been taking the medicine per patient.  However he is unsure whether is had any seizures that or not alcohol related.  Not currently seizing.  However does appear to be in withdrawal.  Will give 2 mg of Ativan.  Will get basic blood work.  Will get head CT.  Reviewed discharge note from previous alcohol withdrawal.  Lab work does show an hypomagnesemia.  Will supplement both.  Patient now resting comfortably after Ativan.  IV thiamine now ordered.  Will require admission to the hospital with severe alcohol withdrawal.  However think and be admitted to medicine.  LFTs elevated with new bilirubin elevation.  Ammonia also elevated.  Patient has a MELD sodium score of 20  Will discuss with hospitalist.  CRITICAL CARE Performed by:  Benjiman Core Total critical care time: 30 minutes Critical care time was exclusive of separately billable procedures and treating other patients. Critical care was necessary to treat or prevent imminent or life-threatening deterioration. Critical care was time spent personally by me on the following activities: development of treatment plan with patient and/or surrogate as well as nursing, discussions with consultants, evaluation of patient's response to treatment, examination of patient, obtaining history from patient or surrogate, ordering and performing treatments and interventions, ordering and review of laboratory studies, ordering and review of radiographic studies, pulse oximetry and re-evaluation of patient's condition.         Final Clinical Impression(s) / ED Diagnoses Final diagnoses:  Alcohol withdrawal seizure without complication (HCC)  Alcohol withdrawal syndrome with complication (HCC)  Hypokalemia    Rx / DC Orders ED Discharge Orders     None         Benjiman Core, MD 11/11/23 1048

## 2023-11-12 ENCOUNTER — Observation Stay (HOSPITAL_COMMUNITY)

## 2023-11-12 DIAGNOSIS — E876 Hypokalemia: Secondary | ICD-10-CM | POA: Diagnosis present

## 2023-11-12 DIAGNOSIS — G629 Polyneuropathy, unspecified: Secondary | ICD-10-CM | POA: Diagnosis present

## 2023-11-12 DIAGNOSIS — G40909 Epilepsy, unspecified, not intractable, without status epilepticus: Secondary | ICD-10-CM | POA: Diagnosis present

## 2023-11-12 DIAGNOSIS — F4310 Post-traumatic stress disorder, unspecified: Secondary | ICD-10-CM | POA: Diagnosis present

## 2023-11-12 DIAGNOSIS — R0602 Shortness of breath: Secondary | ICD-10-CM | POA: Diagnosis not present

## 2023-11-12 DIAGNOSIS — F10239 Alcohol dependence with withdrawal, unspecified: Secondary | ICD-10-CM | POA: Diagnosis present

## 2023-11-12 DIAGNOSIS — Z818 Family history of other mental and behavioral disorders: Secondary | ICD-10-CM | POA: Diagnosis not present

## 2023-11-12 DIAGNOSIS — Z811 Family history of alcohol abuse and dependence: Secondary | ICD-10-CM | POA: Diagnosis not present

## 2023-11-12 DIAGNOSIS — R569 Unspecified convulsions: Secondary | ICD-10-CM

## 2023-11-12 DIAGNOSIS — K7460 Unspecified cirrhosis of liver: Secondary | ICD-10-CM | POA: Diagnosis present

## 2023-11-12 DIAGNOSIS — M109 Gout, unspecified: Secondary | ICD-10-CM | POA: Diagnosis present

## 2023-11-12 DIAGNOSIS — D539 Nutritional anemia, unspecified: Secondary | ICD-10-CM | POA: Diagnosis present

## 2023-11-12 DIAGNOSIS — F17210 Nicotine dependence, cigarettes, uncomplicated: Secondary | ICD-10-CM | POA: Diagnosis present

## 2023-11-12 DIAGNOSIS — Z91199 Patient's noncompliance with other medical treatment and regimen due to unspecified reason: Secondary | ICD-10-CM | POA: Diagnosis not present

## 2023-11-12 DIAGNOSIS — R7401 Elevation of levels of liver transaminase levels: Secondary | ICD-10-CM | POA: Diagnosis not present

## 2023-11-12 DIAGNOSIS — I10 Essential (primary) hypertension: Secondary | ICD-10-CM | POA: Diagnosis present

## 2023-11-12 DIAGNOSIS — G40509 Epileptic seizures related to external causes, not intractable, without status epilepticus: Secondary | ICD-10-CM | POA: Diagnosis present

## 2023-11-12 DIAGNOSIS — K219 Gastro-esophageal reflux disease without esophagitis: Secondary | ICD-10-CM | POA: Diagnosis present

## 2023-11-12 DIAGNOSIS — F10930 Alcohol use, unspecified with withdrawal, uncomplicated: Secondary | ICD-10-CM

## 2023-11-12 DIAGNOSIS — K760 Fatty (change of) liver, not elsewhere classified: Secondary | ICD-10-CM | POA: Diagnosis present

## 2023-11-12 DIAGNOSIS — Z8249 Family history of ischemic heart disease and other diseases of the circulatory system: Secondary | ICD-10-CM | POA: Diagnosis not present

## 2023-11-12 DIAGNOSIS — F259 Schizoaffective disorder, unspecified: Secondary | ICD-10-CM | POA: Diagnosis present

## 2023-11-12 DIAGNOSIS — Z23 Encounter for immunization: Secondary | ICD-10-CM | POA: Diagnosis not present

## 2023-11-12 DIAGNOSIS — F32A Depression, unspecified: Secondary | ICD-10-CM | POA: Diagnosis present

## 2023-11-12 DIAGNOSIS — E871 Hypo-osmolality and hyponatremia: Secondary | ICD-10-CM | POA: Diagnosis present

## 2023-11-12 DIAGNOSIS — Z79899 Other long term (current) drug therapy: Secondary | ICD-10-CM | POA: Diagnosis not present

## 2023-11-12 LAB — COMPREHENSIVE METABOLIC PANEL WITH GFR
ALT: 60 U/L — ABNORMAL HIGH (ref 0–44)
AST: 167 U/L — ABNORMAL HIGH (ref 15–41)
Albumin: 2.9 g/dL — ABNORMAL LOW (ref 3.5–5.0)
Alkaline Phosphatase: 108 U/L (ref 38–126)
Anion gap: 11 (ref 5–15)
BUN: 19 mg/dL (ref 6–20)
CO2: 31 mmol/L (ref 22–32)
Calcium: 8.6 mg/dL — ABNORMAL LOW (ref 8.9–10.3)
Chloride: 94 mmol/L — ABNORMAL LOW (ref 98–111)
Creatinine, Ser: 1.09 mg/dL (ref 0.61–1.24)
GFR, Estimated: >60 mL/min (ref >60)
Glucose, Bld: 99 mg/dL (ref 70–99)
Potassium: 3.2 mmol/L — ABNORMAL LOW (ref 3.5–5.1)
Sodium: 136 mmol/L (ref 135–145)
Total Bilirubin: 5.3 mg/dL — ABNORMAL HIGH (ref 0.0–1.2)
Total Protein: 6.1 g/dL — ABNORMAL LOW (ref 6.5–8.1)

## 2023-11-12 LAB — CBC
HCT: 40.7 % (ref 39.0–52.0)
Hemoglobin: 13.8 g/dL (ref 13.0–17.0)
MCH: 34.8 pg — ABNORMAL HIGH (ref 26.0–34.0)
MCHC: 33.9 g/dL (ref 30.0–36.0)
MCV: 102.5 fL — ABNORMAL HIGH (ref 80.0–100.0)
Platelets: 134 10*3/uL — ABNORMAL LOW (ref 150–400)
RBC: 3.97 MIL/uL — ABNORMAL LOW (ref 4.22–5.81)
RDW: 16.8 % — ABNORMAL HIGH (ref 11.5–15.5)
WBC: 5.2 10*3/uL (ref 4.0–10.5)
nRBC: 0 % (ref 0.0–0.2)

## 2023-11-12 LAB — BILIRUBIN, DIRECT: Bilirubin, Direct: 2.2 mg/dL — ABNORMAL HIGH (ref 0.0–0.2)

## 2023-11-12 LAB — PROTIME-INR
INR: 1.1 (ref 0.8–1.2)
Prothrombin Time: 14.4 s (ref 11.4–15.2)

## 2023-11-12 LAB — PHOSPHORUS: Phosphorus: 3.7 mg/dL (ref 2.5–4.6)

## 2023-11-12 LAB — MAGNESIUM: Magnesium: 2.1 mg/dL (ref 1.7–2.4)

## 2023-11-12 LAB — AMMONIA: Ammonia: 62 umol/L — ABNORMAL HIGH (ref 9–35)

## 2023-11-12 MED ORDER — INFLUENZA VIRUS VACC SPLIT PF (FLUZONE) 0.5 ML IM SUSY
0.5000 mL | PREFILLED_SYRINGE | INTRAMUSCULAR | Status: AC
  Administered 2023-11-13: 0.5 mL via INTRAMUSCULAR
  Filled 2023-11-12: qty 0.5

## 2023-11-12 MED ORDER — ENSURE ENLIVE PO LIQD
237.0000 mL | Freq: Two times a day (BID) | ORAL | Status: DC
  Administered 2023-11-14 (×2): 237 mL via ORAL

## 2023-11-12 MED ORDER — POTASSIUM CHLORIDE CRYS ER 20 MEQ PO TBCR
40.0000 meq | EXTENDED_RELEASE_TABLET | Freq: Two times a day (BID) | ORAL | Status: AC
Start: 2023-11-12 — End: 2023-11-12
  Administered 2023-11-12 (×2): 40 meq via ORAL
  Filled 2023-11-12 (×2): qty 2

## 2023-11-12 NOTE — Progress Notes (Signed)
 PROGRESS NOTE                                                                                                                                                                                                             Patient Demographics:    [name], is a 53 y.o. male, DOB - [birth year], ZOX:[id number]  Outpatient Primary MD for the patient is Georganna Skeans, MD    LOS - 0  Admit date - 11/11/2023    Chief Complaint  Patient presents with   Seizures       Brief Narrative (HPI from H&P)   53 y.o. male with chronic alcoholism, withdrawal seizures, hepatic steatosis, chronic anemia, schizoaffective disorder, anxiety, depression, PTSD Patient was brought to the ED this morning by EMS for a witnessed seizure activity. Patient was at a store and suddenly had a seizure activity witnessed by the workers there.  EMS was called and patient was brought to the ED.    Subjective:    Starling Christofferson today has, No headache, No chest pain, No abdominal pain - No Nausea, No new weakness tingling or numbness, no SOB   Assessment  & Plan :   Alcohol withdrawal seizure - Brought into the ED after witnessed seizure-like activity, no headache head CT unremarkable, he has history of alcohol withdrawal seizures in the past, last alcoholic drink 2 days prior to hospital visit, also on Keppra at home and noncompliant with it.  Counseled to abstain from alcohol, be compliant with Keppra, placed back on Keppra, currently seizure-free but in DTs.  Continue CIWA protocol along with Librium taper.  Chronic alcoholism - Hepatic steatosis  AST, ALT, alk phos, total bilirubin elevated as expected with alcohol abuse and alcohol-related liver injury, counseled to quit alcohol outpatient PCP and GI follow-up.  Stable INR suggesting satisfactory liver synthetic function.  Hypokalemia/hypomagnesemia/hyponatremia.  Hydrate with IV fluids, replace  electrolytes.  Chronic macrocytic anemia  No acute issues, outpatient monitoring by PCP  Psychiatric disorders - schizoaffective disorder, anxiety, depression, PTSD - Continue home medications Seroquel, sertraline counseled on compliance   Gout  Allopurinol   Neuropathy  Gabapentin   GERD  PPI       Condition - Fair  Family Communication  :  None  Code Status :  Full  Consults  :  None  PUD Prophylaxis :  PPI   Procedures  :     CT Head.  Nonacute.      Disposition Plan  :    Status is: Observation   DVT Prophylaxis  :    enoxaparin (LOVENOX) injection 40 mg Start: 11/11/23 1130   Lab Results  Component Value Date   PLT 134 (L) 11/12/2023    Diet :  Diet Order             Diet regular Room service appropriate? Yes; Fluid consistency: Thin  Diet effective now                    Inpatient Medications  Scheduled Meds:  allopurinol  100 mg Oral Daily   chlordiazePOXIDE  25 mg Oral QID   Followed by   chlordiazePOXIDE  25 mg Oral TID   Followed by   Melene Muller ON 11/13/2023] chlordiazePOXIDE  25 mg Oral BH-qamhs   Followed by   Melene Muller ON 11/14/2023] chlordiazePOXIDE  25 mg Oral Daily   enoxaparin (LOVENOX) injection  40 mg Subcutaneous Q24H   folic acid  1 mg Oral Daily   gabapentin  300 mg Oral TID   levETIRAcetam  500 mg Oral BID   multivitamin with minerals  1 tablet Oral Daily   pantoprazole  40 mg Oral Daily   potassium chloride  40 mEq Oral BID   QUEtiapine  200 mg Oral QHS   sertraline  25 mg Oral Daily   thiamine  100 mg Oral Daily   Or   thiamine  100 mg Intravenous Daily   Continuous Infusions:  sodium chloride 75 mL/hr at 11/12/23 0303   PRN Meds:.acetaminophen **OR** acetaminophen, albuterol, bisacodyl, chlordiazePOXIDE, hydrALAZINE, hydrOXYzine, loperamide, LORazepam **OR** LORazepam, ondansetron, polyethylene glycol  Antibiotics  :    Anti-infectives (From admission, onward)    None         Objective:   Vitals:    11/11/23 2037 11/11/23 2329 11/12/23 0434 11/12/23 0600  BP: 103/72 95/66 105/81   Pulse: 86 86 92   Resp: 19 (!) 21 (!) 29 20  Temp: (!) 97.4 F (36.3 C) 98.2 F (36.8 C) (!) 97.3 F (36.3 C)   TempSrc: Oral Oral Oral   SpO2: 91% 92% 97%   Weight:      Height:        Wt Readings from Last 3 Encounters:  11/11/23 99.8 kg  03/25/23 112.3 kg  03/05/23 99.8 kg     Intake/Output Summary (Last 24 hours) at 11/12/2023 0841 Last data filed at 11/12/2023 0303 Gross per 24 hour  Intake 2885.9 ml  Output 100 ml  Net 2785.9 ml     Physical Exam  Awake Alert, No new F.N deficits, mild Dts [place].AT,PERRAL Supple Neck, No JVD,   Symmetrical Chest wall movement, Good air movement bilaterally, CTAB RRR,No Gallops,Rubs or new Murmurs,  +ve B.Sounds, Abd Soft, No tenderness,   No Cyanosis, Clubbing or edema      Data Review:    Recent Labs  Lab 11/11/23 0840 11/12/23 0446  WBC 9.1 5.2  HGB 16.3 13.8  HCT 47.4 40.7  PLT 193 134*  MCV 100.2* 102.5*  MCH 34.5* 34.8*  MCHC 34.4 33.9  RDW 16.7* 16.8*  LYMPHSABS 1.4  --   MONOABS 0.7  --   EOSABS 0.0  --   BASOSABS 0.1  --     Recent Labs  Lab 11/11/23 0840 11/11/23 1553 11/12/23 0446  NA 131* 134* 136  K 2.7*  3.1* 3.2*  CL 82* 90* 94*  CO2 27 32 31  ANIONGAP 22* 12 11  GLUCOSE 177* 89 99  BUN 17 18 19   CREATININE 1.22 1.15 1.09  AST 141*  --  167*  ALT 55*  --  60*  ALKPHOS 139*  --  108  BILITOT 4.8*  --  5.3*  ALBUMIN 3.5  --  2.9*  INR 1.1  --   --   AMMONIA 76*  --   --   MG 1.6*  --  2.1  PHOS  --   --  3.7  CALCIUM 8.9 8.5* 8.6*      Recent Labs  Lab 11/11/23 0840 11/11/23 1553 11/12/23 0446  INR 1.1  --   --   AMMONIA 76*  --   --   MG 1.6*  --  2.1  CALCIUM 8.9 8.5* 8.6*    --------------------------------------------------------------------------------------------------------------- Lab Results  Component Value Date   CHOL 159 03/02/2019   HDL 53 03/02/2019   LDLCALC 84 03/02/2019    TRIG 164 (H) 07/21/2020   CHOLHDL 3.0 03/02/2019    Lab Results  Component Value Date   HGBA1C 5.5 09/07/2021    Radiology Report DG Chest Portable 1 View Result Date: 11/11/2023 CLINICAL DATA:  Shortness of breath EXAM: PORTABLE CHEST 1 VIEW COMPARISON:  07/23/2020 FINDINGS: The heart size and mediastinal contours are within normal limits. Both lungs are clear. The visualized skeletal structures are unremarkable. IMPRESSION: Normal study. Electronically Signed   By: Charlett Nose M.D.   On: 11/11/2023 10:31   CT HEAD WO CONTRAST ( ) Result Date: 11/11/2023 CLINICAL DATA:  Seizure, new-onset, no history of trauma. EXAM: CT HEAD WITHOUT CONTRAST TECHNIQUE: Contiguous axial images were obtained from the base of the skull through the vertex without intravenous contrast. RADIATION DOSE REDUCTION: This exam was performed according to the departmental dose-optimization program which includes automated exposure control, adjustment of the mA and/or kV according to patient size and/or use of iterative reconstruction technique. COMPARISON:  Head CT 03/07/2023 FINDINGS: Brain: There is no evidence of an acute infarct, intracranial hemorrhage, mass, midline shift, or extra-axial fluid collection. Mild cerebral atrophy is unchanged. Vascular: No suspicious focal vascular hyperdensity. Skull: No fracture or suspicious lesion. Sinuses/Orbits: Visualized paranasal sinuses and mastoid air cells are clear. Unremarkable included orbits. Other: None. IMPRESSION: No evidence of acute intracranial abnormality. Electronically Signed   By: Sebastian Ache M.D.   On: 11/11/2023 10:17     Signature  -   Susa Raring M.D on 11/12/2023 at 8:41 AM   -  To page go to [email or web address]

## 2023-11-12 NOTE — Progress Notes (Signed)
 Physical Therapy Evaluation Patient Details Name: [name] MRN: [id number] DOB: Dec 19, 1970 Today's Date: 11/12/2023  History of Present Illness  53 y.o. male presents to [facility] 11/11/23 after seizure-like activity, hx of alcohol withdrawal seizures. On CIWA protocol. PMHx:  chronic alcoholism, withdrawal seizures, hepatic steatosis, chronic anemia, schizoaffective disorder, anxiety, depression, PTSD   Clinical Impression  Pt in bed upon arrival and agreeable to PT eval. Pt reported feeling confused, however, was able to explain why he was in the hospital and was A&Ox4. Pt reported blacking out after the seizure activity and was not sure if he hit his head. Discussed signs of a concussion with pt verbalizing understanding and the need to reach out to RN/MD if he starts to experience those symptoms. PTA, pt was ModI with a SP cane. In today's session, pt was ModI for bed mobility and required CGA to stand with a RW. Pt was able to ambulate ~200 ft with a RW and CGA for safety. Pt currently with functional limitations due to the deficits listed below (see PT Problem List). Pt would benefit from acute skilled PT to address functional impairments. Pt has intermittent physical help available upon d/c from the hospital. Ancipitate pt will have no post-acute needs pending continued progress with mobility. Acute PT to follow.         If plan is discharge home, recommend the following: A little help with walking and/or transfers;Assist for transportation;Help with stairs or ramp for entrance   Can travel by private vehicle    Yes    Equipment Recommendations None recommended by PT     Functional Status Assessment Patient has had a recent decline in their functional status and demonstrates the ability to make significant improvements in function in a reasonable and predictable amount of time.     Precautions / Restrictions Precautions Precautions: Fall Restrictions Weight Bearing Restrictions Per  Provider Order: No      Mobility  Bed Mobility Overal bed mobility: Modified Independent    General bed mobility comments: ModI with HOB elevated    Transfers Overall transfer level: Needs assistance Equipment used: Rolling walker (2 wheels) Transfers: Sit to/from Stand Sit to Stand: Contact guard assist     General transfer comment: CGA for safety    Ambulation/Gait Ambulation/Gait assistance: Contact guard assist Gait Distance (Feet): 200 Feet Assistive device: Rolling walker (2 wheels) Gait Pattern/deviations: Step-through pattern, Decreased step length - right, Decreased step length - left, Trunk flexed, Wide base of support Gait velocity: decr    General Gait Details: wide BOS, cues for technique with RW as pt tends to walk with one leg outside the RW.     Balance Overall balance assessment: Needs assistance, Mild deficits observed, not formally tested Sitting-balance support: No upper extremity supported, Feet supported Sitting balance-Leahy Scale: Good     Standing balance support: Bilateral upper extremity supported, During functional activity, Reliant on assistive device for balance Standing balance-Leahy Scale: Fair Standing balance comment: able to stand with no AD, RW for safety with gait         Pertinent Vitals/Pain Pain Assessment Pain Assessment: Faces Faces Pain Scale: Hurts a little bit Pain Location: L wrist Pain Descriptors / Indicators: Aching, Discomfort Pain Intervention(s): Limited activity within patient's tolerance, Monitored during session, Repositioned    Home Living Family/patient expects to be discharged to:: Private residence Living Arrangements: Alone Available Help at Discharge: Family;Available PRN/intermittently Type of Home: House Home Access: Stairs to enter Entrance Stairs-Rails: Right;Left;Can reach both Entrance Stairs-Number of  Steps: 2   Home Layout: One level Home Equipment: Rolling Walker (2 wheels);Shower  seat;Cane - single point      Prior Function Prior Level of Function : Independent/Modified Independent;Driving (on disability)    Mobility Comments: ModI with SP cane, reports LOC nad memory loss during time frame of the seizure ADLs Comments: Indepedent, cooks own meals, wears glasses     Extremity/Trunk Assessment   Upper Extremity Assessment Upper Extremity Assessment: Overall WFL for tasks assessed (pain in left wrist)    Lower Extremity Assessment Lower Extremity Assessment: Overall WFL for tasks assessed    Cervical / Trunk Assessment Cervical / Trunk Assessment: Normal  Communication   Communication Communication: No apparent difficulties    Cognition Arousal: Alert Behavior During Therapy: Flat affect   PT - Cognitive impairments: No family/caregiver present to determine baseline, Safety/Judgement       PT - Cognition Comments: A&Ox4, decreased safety awareness when ambulating with RW Following commands: Intact       Cueing Cueing Techniques: Verbal cues     General Comments General comments (skin integrity, edema, etc.): Bruise on L posterior flank. HR 110-120 BPM during gait     PT Assessment Patient needs continued PT services  PT Problem List Decreased activity tolerance;Decreased balance;Decreased mobility;Decreased safety awareness       PT Treatment Interventions DME instruction;Gait training;Stair training;Functional mobility training;Balance training;Therapeutic activities;Therapeutic exercise;Neuromuscular re-education;Patient/family education    PT Goals (Current goals can be found in the Care Plan section)  Acute Rehab PT Goals Patient Stated Goal: to get better PT Goal Formulation: With patient Time For Goal Achievement: 11/26/23 Potential to Achieve Goals: Good    Frequency Min 2X/week        AM-PAC PT "6 Clicks" Mobility  Outcome Measure Help needed turning from your back to your side while in a flat bed without using bedrails?:  None Help needed moving from lying on your back to sitting on the side of a flat bed without using bedrails?: None Help needed moving to and from a bed to a chair (including a wheelchair)?: A Little Help needed standing up from a chair using your arms (e.g., wheelchair or bedside chair)?: A Little Help needed to walk in hospital room?: A Little Help needed climbing 3-5 steps with a railing? : A Little 6 Click Score: 20    End of Session   Activity Tolerance: Patient tolerated treatment well Patient left: in chair;with call bell/phone within reach Nurse Communication: Mobility status PT Visit Diagnosis: Other abnormalities of gait and mobility (R26.89)    Time: 1308-6578 PT Time Calculation (min) (ACUTE ONLY): 22 min   Charges:   PT Evaluation $PT Eval Low Complexity: 1 Low   PT General Charges $$ ACUTE PT VISIT: 1 Visit        Hilton Cork, PT, DPT Secure Chat Preferred  Rehab Office [phone]   Arturo Morton Brion Aliment 11/12/2023, 2:46 PM

## 2023-11-13 DIAGNOSIS — F10930 Alcohol use, unspecified with withdrawal, uncomplicated: Secondary | ICD-10-CM | POA: Diagnosis not present

## 2023-11-13 DIAGNOSIS — R569 Unspecified convulsions: Secondary | ICD-10-CM | POA: Diagnosis not present

## 2023-11-13 LAB — CBC WITH DIFFERENTIAL/PLATELET
Abs Immature Granulocytes: 0.03 10*3/uL (ref 0.00–0.07)
Basophils Absolute: 0 10*3/uL (ref 0.0–0.1)
Basophils Relative: 1 %
Eosinophils Absolute: 0.1 10*3/uL (ref 0.0–0.5)
Eosinophils Relative: 1 %
HCT: 37.8 % — ABNORMAL LOW (ref 39.0–52.0)
Hemoglobin: 12.7 g/dL — ABNORMAL LOW (ref 13.0–17.0)
Immature Granulocytes: 1 %
Lymphocytes Relative: 41 %
Lymphs Abs: 2 10*3/uL (ref 0.7–4.0)
MCH: 34.8 pg — ABNORMAL HIGH (ref 26.0–34.0)
MCHC: 33.6 g/dL (ref 30.0–36.0)
MCV: 103.6 fL — ABNORMAL HIGH (ref 80.0–100.0)
Monocytes Absolute: 0.4 10*3/uL (ref 0.1–1.0)
Monocytes Relative: 8 %
Neutro Abs: 2.4 10*3/uL (ref 1.7–7.7)
Neutrophils Relative %: 48 %
Platelets: 120 10*3/uL — ABNORMAL LOW (ref 150–400)
RBC: 3.65 MIL/uL — ABNORMAL LOW (ref 4.22–5.81)
RDW: 16.7 % — ABNORMAL HIGH (ref 11.5–15.5)
WBC: 5 10*3/uL (ref 4.0–10.5)
nRBC: 0 % (ref 0.0–0.2)

## 2023-11-13 LAB — COMPREHENSIVE METABOLIC PANEL WITH GFR
ALT: 87 U/L — ABNORMAL HIGH (ref 0–44)
AST: 206 U/L — ABNORMAL HIGH (ref 15–41)
Albumin: 2.8 g/dL — ABNORMAL LOW (ref 3.5–5.0)
Alkaline Phosphatase: 101 U/L (ref 38–126)
Anion gap: 12 (ref 5–15)
BUN: 14 mg/dL (ref 6–20)
CO2: 23 mmol/L (ref 22–32)
Calcium: 8.5 mg/dL — ABNORMAL LOW (ref 8.9–10.3)
Chloride: 104 mmol/L (ref 98–111)
Creatinine, Ser: 0.95 mg/dL (ref 0.61–1.24)
GFR, Estimated: >60 mL/min (ref >60)
Glucose, Bld: 91 mg/dL (ref 70–99)
Potassium: 4.1 mmol/L (ref 3.5–5.1)
Sodium: 139 mmol/L (ref 135–145)
Total Bilirubin: 3.6 mg/dL — ABNORMAL HIGH (ref 0.0–1.2)
Total Protein: 5.7 g/dL — ABNORMAL LOW (ref 6.5–8.1)

## 2023-11-13 LAB — BRAIN NATRIURETIC PEPTIDE: B Natriuretic Peptide: 52 pg/mL (ref 0.0–100.0)

## 2023-11-13 LAB — MAGNESIUM: Magnesium: 1.8 mg/dL (ref 1.7–2.4)

## 2023-11-13 LAB — PHOSPHORUS: Phosphorus: 3.1 mg/dL (ref 2.5–4.6)

## 2023-11-13 LAB — AMMONIA: Ammonia: 51 umol/L — ABNORMAL HIGH (ref 9–35)

## 2023-11-13 MED ORDER — LACTULOSE 10 GM/15ML PO SOLN
30.0000 g | Freq: Two times a day (BID) | ORAL | Status: DC
  Administered 2023-11-13 – 2023-11-15 (×5): 30 g via ORAL
  Filled 2023-11-13 (×5): qty 60

## 2023-11-13 NOTE — Progress Notes (Signed)
 PROGRESS NOTE                                                                                                                                                                                                             Patient Demographics:    [name], is a 53 y.o. male, DOB - [birth year], ZOX:[id number]  Outpatient Primary MD for the patient is Georganna Skeans, MD    LOS - 1  Admit date - 11/11/2023    Chief Complaint  Patient presents with   Seizures       Brief Narrative (HPI from H&P)   53 y.o. male with chronic alcoholism, withdrawal seizures, hepatic steatosis, chronic anemia, schizoaffective disorder, anxiety, depression, PTSD Patient was brought to the ED this morning by EMS for a witnessed seizure activity. Patient was at a store and suddenly had a seizure activity witnessed by the workers there.  EMS was called and patient was brought to the ED.    Subjective:   Patient in bed, appears comfortable, denies any headache, no fever, no chest pain or pressure, no shortness of breath , no abdominal pain. No new focal weakness.   Assessment  & Plan :   Alcohol withdrawal seizure - Brought into the ED after witnessed seizure-like activity, no headache head CT unremarkable, he has history of alcohol withdrawal seizures in the past, last alcoholic drink 2 days prior to hospital visit, also on Keppra at home and noncompliant with it. Counseled to abstain from alcohol, be compliant with Keppra, placed back on Keppra, currently seizure-free but in DTs.  Continue CIWA protocol along with Librium taper.  Chronic alcoholism - Hepatic steatosis  AST, ALT, alk phos, total bilirubin elevated as expected with alcohol abuse and alcohol-related liver injury, counseled to quit alcohol outpatient PCP and GI follow-up.  Stable INR suggesting satisfactory liver synthetic function.  Hypokalemia/hypomagnesemia/hyponatremia.  Hydrate with  IV fluids, replace electrolytes.  Chronic macrocytic anemia  No acute issues, outpatient monitoring by PCP  Psychiatric disorders - schizoaffective disorder, anxiety, depression, PTSD - Continue home medications Seroquel, sertraline counseled on compliance   Gout  Allopurinol   Neuropathy  Gabapentin   GERD  PPI       Condition - Fair  Family Communication  :  None  Code Status :  Full  Consults  :  None  PUD  Prophylaxis :  PPI   Procedures  :     CT Head.  Nonacute.      Disposition Plan  :    Status is: Observation   DVT Prophylaxis  :    enoxaparin (LOVENOX) injection 40 mg Start: 11/11/23 1130   Lab Results  Component Value Date   PLT 120 (L) 11/13/2023    Diet :  Diet Order             Diet regular Room service appropriate? Yes; Fluid consistency: Thin  Diet effective now                    Inpatient Medications  Scheduled Meds:  allopurinol  100 mg Oral Daily   chlordiazePOXIDE  25 mg Oral TID   Followed by   chlordiazePOXIDE  25 mg Oral BH-qamhs   Followed by   Melene Muller ON 11/14/2023] chlordiazePOXIDE  25 mg Oral Daily   enoxaparin (LOVENOX) injection  40 mg Subcutaneous Q24H   feeding supplement  237 mL Oral BID BM   folic acid  1 mg Oral Daily   gabapentin  300 mg Oral TID   influenza vac split trivalent PF  0.5 mL Intramuscular Tomorrow-1000   lactulose  30 g Oral BID   levETIRAcetam  500 mg Oral BID   multivitamin with minerals  1 tablet Oral Daily   pantoprazole  40 mg Oral Daily   QUEtiapine  200 mg Oral QHS   sertraline  25 mg Oral Daily   thiamine  100 mg Oral Daily   Or   thiamine  100 mg Intravenous Daily   Continuous Infusions:  sodium chloride 75 mL/hr at 11/13/23 0512   PRN Meds:.acetaminophen **OR** acetaminophen, albuterol, bisacodyl, chlordiazePOXIDE, hydrALAZINE, hydrOXYzine, loperamide, LORazepam **OR** LORazepam, ondansetron, polyethylene glycol  Antibiotics  :    Anti-infectives (From admission, onward)     None         Objective:   Vitals:   11/12/23 2200 11/12/23 2345 11/13/23 0510 11/13/23 0545  BP:  (!) 103/59 105/73   Pulse: 86 83 74   Resp:  20 20   Temp:  98.4 F (36.9 C) (!) 97.4 F (36.3 C)   TempSrc:  Axillary Oral   SpO2:  90% 94% 97%  Weight:      Height:        Wt Readings from Last 3 Encounters:  11/11/23 99.8 kg  03/25/23 112.3 kg  03/05/23 99.8 kg     Intake/Output Summary (Last 24 hours) at 11/13/2023 1021 Last data filed at 11/13/2023 0512 Gross per 24 hour  Intake 480 ml  Output 1050 ml  Net -570 ml     Physical Exam  Awake Alert, No new F.N deficits, mild Dts [place].AT,PERRAL Supple Neck, No JVD,   Symmetrical Chest wall movement, Good air movement bilaterally, CTAB RRR,No Gallops,Rubs or new Murmurs,  +ve B.Sounds, Abd Soft, No tenderness,   No Cyanosis, Clubbing or edema      Data Review:    Recent Labs  Lab 11/11/23 0840 11/12/23 0446 11/13/23 0531  WBC 9.1 5.2 5.0  HGB 16.3 13.8 12.7*  HCT 47.4 40.7 37.8*  PLT 193 134* 120*  MCV 100.2* 102.5* 103.6*  MCH 34.5* 34.8* 34.8*  MCHC 34.4 33.9 33.6  RDW 16.7* 16.8* 16.7*  LYMPHSABS 1.4  --  2.0  MONOABS 0.7  --  0.4  EOSABS 0.0  --  0.1  BASOSABS 0.1  --  0.0  Recent Labs  Lab 11/11/23 0840 11/11/23 1553 11/12/23 0446 11/12/23 0828 11/13/23 0531  NA 131* 134* 136  --  139  K 2.7* 3.1* 3.2*  --  4.1  CL 82* 90* 94*  --  104  CO2 27 32 31  --  23  ANIONGAP 22* 12 11  --  12  GLUCOSE 177* 89 99  --  91  BUN 17 18 19   --  14  CREATININE 1.22 1.15 1.09  --  0.95  AST 141*  --  167*  --  206*  ALT 55*  --  60*  --  87*  ALKPHOS 139*  --  108  --  101  BILITOT 4.8*  --  5.3*  --  3.6*  ALBUMIN 3.5  --  2.9*  --  2.8*  INR 1.1  --   --  1.1  --   AMMONIA 76*  --   --  62* 51*  BNP  --   --   --   --  52.0  MG 1.6*  --  2.1  --  1.8  PHOS  --   --  3.7  --  3.1  CALCIUM 8.9 8.5* 8.6*  --  8.5*      Recent Labs  Lab 11/11/23 0840 11/11/23 1553 11/12/23 0446  11/12/23 0828 11/13/23 0531  INR 1.1  --   --  1.1  --   AMMONIA 76*  --   --  62* 51*  BNP  --   --   --   --  52.0  MG 1.6*  --  2.1  --  1.8  CALCIUM 8.9 8.5* 8.6*  --  8.5*    --------------------------------------------------------------------------------------------------------------- Lab Results  Component Value Date   CHOL 159 03/02/2019   HDL 53 03/02/2019   LDLCALC 84 03/02/2019   TRIG 164 (H) 07/21/2020   CHOLHDL 3.0 03/02/2019    Lab Results  Component Value Date   HGBA1C 5.5 09/07/2021    Radiology Report DG Chest Port 1 View Result Date: 11/12/2023 CLINICAL DATA:  Shortness of breath. EXAM: PORTABLE CHEST 1 VIEW COMPARISON:  Chest radiograph dated 11/11/2023. FINDINGS: The heart size and mediastinal contours are within normal limits. Both lungs are clear. The visualized skeletal structures are unremarkable. IMPRESSION: No active disease. Electronically Signed   By: Elgie Collard M.D.   On: 11/12/2023 10:12     Signature  -   Susa Raring M.D on 11/13/2023 at 10:21 AM   -  To page go to [email or web address]

## 2023-11-13 NOTE — Plan of Care (Signed)
  Problem: Education: Goal: Knowledge of General Education information will improve Description: Including pain rating scale, medication(s)/side effects and non-pharmacologic comfort measures Outcome: Progressing   Problem: Clinical Measurements: Goal: Ability to maintain clinical measurements within normal limits will improve Outcome: Progressing Goal: Will remain free from infection Outcome: Progressing   Problem: Activity: Goal: Risk for activity intolerance will decrease Outcome: Progressing   Problem: Coping: Goal: Level of anxiety will decrease Outcome: Progressing   Problem: Elimination: Goal: Will not experience complications related to bowel motility Outcome: Progressing   Problem: Pain Managment: Goal: General experience of comfort will improve and/or be controlled Outcome: Progressing   Problem: Safety: Goal: Ability to remain free from injury will improve Outcome: Progressing

## 2023-11-13 NOTE — TOC Progression Note (Signed)
 Transition of Care [facility]) - Progression Note    Patient Details  Name: [name] MRN: [id number] Date of Birth: 12-13-1970  Transition of Care [facility]) CM/SW Contact  11/13/2023, 11:12 AM  Clinical Narrative:     CM discussed case with Dr. Thedore Mins regarding discharge care planning. Hospital day 2 with diagnosis of Alcohol Withdrawal.  Estimated date of discharge os 11/14/2023.  Expected Discharge Plan: Home/Self Care Barriers to Discharge: Continued Medical Work up  Expected Discharge Plan and Services   Discharge Planning Services: CM Consult      Social Determinants of Health (SDOH) Interventions SDOH Screenings   Food Insecurity: Food Insecurity Present (11/11/2023)  Housing: High Risk (11/11/2023)  Transportation Needs: No Transportation Needs (11/11/2023)  Utilities: Not At Risk (11/11/2023)  Alcohol Screen: Medium Risk (06/02/2018)  Depression (PHQ2-9): Medium Risk (01/15/2022)  Tobacco Use: High Risk (11/11/2023)    Readmission Risk Interventions    09/19/2021   12:21 PM  Readmission Risk Prevention Plan  HRI or Home Care Consult Complete  Social Work Consult for Recovery Care Planning/Counseling Complete  Palliative Care Screening Not Applicable

## 2023-11-14 ENCOUNTER — Inpatient Hospital Stay (HOSPITAL_COMMUNITY)

## 2023-11-14 DIAGNOSIS — R569 Unspecified convulsions: Secondary | ICD-10-CM | POA: Diagnosis not present

## 2023-11-14 DIAGNOSIS — F10930 Alcohol use, unspecified with withdrawal, uncomplicated: Secondary | ICD-10-CM | POA: Diagnosis not present

## 2023-11-14 LAB — CBC WITH DIFFERENTIAL/PLATELET
Abs Immature Granulocytes: 0.04 10*3/uL (ref 0.00–0.07)
Basophils Absolute: 0 10*3/uL (ref 0.0–0.1)
Basophils Relative: 1 %
Eosinophils Absolute: 0.1 10*3/uL (ref 0.0–0.5)
Eosinophils Relative: 2 %
HCT: 36.4 % — ABNORMAL LOW (ref 39.0–52.0)
Hemoglobin: 12.5 g/dL — ABNORMAL LOW (ref 13.0–17.0)
Immature Granulocytes: 1 %
Lymphocytes Relative: 26 %
Lymphs Abs: 1.4 10*3/uL (ref 0.7–4.0)
MCH: 35.6 pg — ABNORMAL HIGH (ref 26.0–34.0)
MCHC: 34.3 g/dL (ref 30.0–36.0)
MCV: 103.7 fL — ABNORMAL HIGH (ref 80.0–100.0)
Monocytes Absolute: 0.6 10*3/uL (ref 0.1–1.0)
Monocytes Relative: 11 %
Neutro Abs: 3.3 10*3/uL (ref 1.7–7.7)
Neutrophils Relative %: 59 %
Platelets: 133 10*3/uL — ABNORMAL LOW (ref 150–400)
RBC: 3.51 MIL/uL — ABNORMAL LOW (ref 4.22–5.81)
RDW: 16.8 % — ABNORMAL HIGH (ref 11.5–15.5)
WBC: 5.6 10*3/uL (ref 4.0–10.5)
nRBC: 0 % (ref 0.0–0.2)

## 2023-11-14 LAB — COMPREHENSIVE METABOLIC PANEL WITH GFR
ALT: 81 U/L — ABNORMAL HIGH (ref 0–44)
AST: 142 U/L — ABNORMAL HIGH (ref 15–41)
Albumin: 2.7 g/dL — ABNORMAL LOW (ref 3.5–5.0)
Alkaline Phosphatase: 96 U/L (ref 38–126)
Anion gap: 8 (ref 5–15)
BUN: 10 mg/dL (ref 6–20)
CO2: 22 mmol/L (ref 22–32)
Calcium: 8.8 mg/dL — ABNORMAL LOW (ref 8.9–10.3)
Chloride: 104 mmol/L (ref 98–111)
Creatinine, Ser: 0.82 mg/dL (ref 0.61–1.24)
GFR, Estimated: >60 mL/min (ref >60)
Glucose, Bld: 106 mg/dL — ABNORMAL HIGH (ref 70–99)
Potassium: 3.8 mmol/L (ref 3.5–5.1)
Sodium: 134 mmol/L — ABNORMAL LOW (ref 135–145)
Total Bilirubin: 2.4 mg/dL — ABNORMAL HIGH (ref 0.0–1.2)
Total Protein: 5.6 g/dL — ABNORMAL LOW (ref 6.5–8.1)

## 2023-11-14 LAB — AMMONIA: Ammonia: 46 umol/L — ABNORMAL HIGH (ref 9–35)

## 2023-11-14 LAB — MAGNESIUM: Magnesium: 1.4 mg/dL — ABNORMAL LOW (ref 1.7–2.4)

## 2023-11-14 LAB — BRAIN NATRIURETIC PEPTIDE: B Natriuretic Peptide: 89.7 pg/mL (ref 0.0–100.0)

## 2023-11-14 LAB — PHOSPHORUS: Phosphorus: 2.8 mg/dL (ref 2.5–4.6)

## 2023-11-14 MED ORDER — MAGNESIUM SULFATE 4 GM/100ML IV SOLN
4.0000 g | Freq: Once | INTRAVENOUS | Status: AC
  Administered 2023-11-14: 4 g via INTRAVENOUS
  Filled 2023-11-14: qty 100

## 2023-11-14 MED ORDER — ORAL CARE MOUTH RINSE: 15.0000 mL | OROMUCOSAL | Status: DC | PRN

## 2023-11-14 NOTE — Progress Notes (Signed)
 PROGRESS NOTE                                                                                                                                                                                                             Patient Demographics:    [name], is a 53 y.o. male, DOB - [birth year], UYQ:[id number]  Outpatient Primary MD for the patient is Georganna Skeans, MD    LOS - 2  Admit date - 11/11/2023    Chief Complaint  Patient presents with   Seizures       Brief Narrative (HPI from H&P)   52 y.o. male with chronic alcoholism, withdrawal seizures, hepatic steatosis, chronic anemia, schizoaffective disorder, anxiety, depression, PTSD Patient was brought to the ED this morning by EMS for a witnessed seizure activity. Patient was at a store and suddenly had a seizure activity witnessed by the workers there.  EMS was called and patient was brought to the ED.    Subjective:   Patient in bed, appears comfortable, denies any headache, no fever, no chest pain or pressure, no shortness of breath , no abdominal pain. No new focal weakness.   Assessment  & Plan :   Alcohol withdrawal seizure - Brought into the ED after witnessed seizure-like activity, no headache head CT unremarkable, he has history of alcohol withdrawal seizures in the past, last alcoholic drink 2 days prior to hospital visit, also on Keppra at home and noncompliant with it. Counseled to abstain from alcohol, be compliant with Keppra, placed back on Keppra, currently seizure-free but in DTs.  Continue CIWA protocol along with Librium taper.  Chronic alcoholism - Hepatic steatosis  AST, ALT, alk phos, total bilirubin elevated as expected with alcohol abuse and alcohol-related liver injury, counseled to quit alcohol outpatient PCP and GI follow-up.  Stable INR suggesting satisfactory liver synthetic function. Check [place] ? Progressed to  Cirrhosis.  Hypokalemia/hypomagnesemia/hyponatremia.  Hydrate with IV fluids, replace electrolytes.  Chronic macrocytic anemia  No acute issues, outpatient monitoring by PCP  Psychiatric disorders - schizoaffective disorder, anxiety, depression, PTSD - Continue home medications Seroquel, sertraline counseled on compliance   Gout  Allopurinol   Neuropathy  Gabapentin   GERD  PPI       Condition - Fair  Family Communication  :  None  Code Status :  Full  Consults  :  None  PUD Prophylaxis :  PPI   Procedures  :     RUQ [place]  CT Head.  Nonacute.      Disposition Plan  :    Status is: Observation   DVT Prophylaxis  :    enoxaparin (LOVENOX) injection 40 mg Start: 11/11/23 1130   Lab Results  Component Value Date   PLT 133 (L) 11/14/2023    Diet :  Diet Order             Diet regular Room service appropriate? Yes; Fluid consistency: Thin  Diet effective now                    Inpatient Medications  Scheduled Meds:  allopurinol  100 mg Oral Daily   chlordiazePOXIDE  25 mg Oral Daily   enoxaparin (LOVENOX) injection  40 mg Subcutaneous Q24H   feeding supplement  237 mL Oral BID BM   folic acid  1 mg Oral Daily   gabapentin  300 mg Oral TID   lactulose  30 g Oral BID   levETIRAcetam  500 mg Oral BID   multivitamin with minerals  1 tablet Oral Daily   pantoprazole  40 mg Oral Daily   QUEtiapine  200 mg Oral QHS   sertraline  25 mg Oral Daily   thiamine  100 mg Oral Daily   Or   thiamine  100 mg Intravenous Daily   Continuous Infusions:  magnesium sulfate bolus IVPB     PRN Meds:.acetaminophen **OR** acetaminophen, albuterol, bisacodyl, chlordiazePOXIDE, hydrALAZINE, hydrOXYzine, loperamide, LORazepam **OR** LORazepam, ondansetron, polyethylene glycol  Antibiotics  :    Anti-infectives (From admission, onward)    None         Objective:   Vitals:   11/13/23 1200 11/13/23 2023 11/14/23 0020 11/14/23 0449  BP: 111/75 116/67  129/83   Pulse: 72 84  79  Resp: (!) 22 (!) 23 17 19   Temp: (!) 97.4 F (36.3 C) 98.8 F (37.1 C) 98.5 F (36.9 C) 98.2 F (36.8 C)  TempSrc: Oral Oral Oral Oral  SpO2: 98% 94%  94%  Weight:      Height:        Wt Readings from Last 3 Encounters:  11/11/23 99.8 kg  03/25/23 112.3 kg  03/05/23 99.8 kg     Intake/Output Summary (Last 24 hours) at 11/14/2023 0954 Last data filed at 11/14/2023 0700 Gross per 24 hour  Intake 240 ml  Output 1350 ml  Net -1110 ml     Physical Exam  Awake Alert, No new F.N deficits, mild Dts [place].AT,PERRAL Supple Neck, No JVD,   Symmetrical Chest wall movement, Good air movement bilaterally, CTAB RRR,No Gallops,Rubs or new Murmurs,  +ve B.Sounds, Abd Soft, No tenderness,   No Cyanosis, Clubbing or edema      Data Review:    Recent Labs  Lab 11/11/23 0840 11/12/23 0446 11/13/23 0531 11/14/23 0515  WBC 9.1 5.2 5.0 5.6  HGB 16.3 13.8 12.7* 12.5*  HCT 47.4 40.7 37.8* 36.4*  PLT 193 134* 120* 133*  MCV 100.2* 102.5* 103.6* 103.7*  MCH 34.5* 34.8* 34.8* 35.6*  MCHC 34.4 33.9 33.6 34.3  RDW 16.7* 16.8* 16.7* 16.8*  LYMPHSABS 1.4  --  2.0 1.4  MONOABS 0.7  --  0.4 0.6  EOSABS 0.0  --  0.1 0.1  BASOSABS 0.1  --  0.0 0.0    Recent Labs  Lab 11/11/23 0840 11/11/23 1553 11/12/23 0446 11/12/23 0828 11/13/23  0531 11/14/23 0515  NA 131* 134* 136  --  139 134*  K 2.7* 3.1* 3.2*  --  4.1 3.8  CL 82* 90* 94*  --  104 104  CO2 27 32 31  --  23 22  ANIONGAP 22* 12 11  --  12 8  GLUCOSE 177* 89 99  --  91 106*  BUN 17 18 19   --  14 10  CREATININE 1.22 1.15 1.09  --  0.95 0.82  AST 141*  --  167*  --  206* 142*  ALT 55*  --  60*  --  87* 81*  ALKPHOS 139*  --  108  --  101 96  BILITOT 4.8*  --  5.3*  --  3.6* 2.4*  ALBUMIN 3.5  --  2.9*  --  2.8* 2.7*  INR 1.1  --   --  1.1  --   --   AMMONIA 76*  --   --  62* 51* 46*  BNP  --   --   --   --  52.0 89.7  MG 1.6*  --  2.1  --  1.8 1.4*  PHOS  --   --  3.7  --  3.1 2.8  CALCIUM 8.9 8.5*  8.6*  --  8.5* 8.8*      Recent Labs  Lab 11/11/23 0840 11/11/23 1553 11/12/23 0446 11/12/23 0828 11/13/23 0531 11/14/23 0515  INR 1.1  --   --  1.1  --   --   AMMONIA 76*  --   --  62* 51* 46*  BNP  --   --   --   --  52.0 89.7  MG 1.6*  --  2.1  --  1.8 1.4*  CALCIUM 8.9 8.5* 8.6*  --  8.5* 8.8*    --------------------------------------------------------------------------------------------------------------- Lab Results  Component Value Date   CHOL 159 03/02/2019   HDL 53 03/02/2019   LDLCALC 84 03/02/2019   TRIG 164 (H) 07/21/2020   CHOLHDL 3.0 03/02/2019    Lab Results  Component Value Date   HGBA1C 5.5 09/07/2021    Radiology Report No results found.    Signature  -   Susa Raring M.D on 11/14/2023 at 9:54 AM   -  To page go to [email or web address]

## 2023-11-15 DIAGNOSIS — F10930 Alcohol use, unspecified with withdrawal, uncomplicated: Secondary | ICD-10-CM | POA: Diagnosis not present

## 2023-11-15 DIAGNOSIS — R569 Unspecified convulsions: Secondary | ICD-10-CM | POA: Diagnosis not present

## 2023-11-15 LAB — COMPREHENSIVE METABOLIC PANEL WITH GFR
ALT: 72 U/L — ABNORMAL HIGH (ref 0–44)
AST: 103 U/L — ABNORMAL HIGH (ref 15–41)
Albumin: 2.7 g/dL — ABNORMAL LOW (ref 3.5–5.0)
Alkaline Phosphatase: 103 U/L (ref 38–126)
Anion gap: 6 (ref 5–15)
BUN: 10 mg/dL (ref 6–20)
CO2: 22 mmol/L (ref 22–32)
Calcium: 8.9 mg/dL (ref 8.9–10.3)
Chloride: 107 mmol/L (ref 98–111)
Creatinine, Ser: 0.77 mg/dL (ref 0.61–1.24)
GFR, Estimated: >60 mL/min (ref >60)
Glucose, Bld: 97 mg/dL (ref 70–99)
Potassium: 3.9 mmol/L (ref 3.5–5.1)
Sodium: 135 mmol/L (ref 135–145)
Total Bilirubin: 1.9 mg/dL — ABNORMAL HIGH (ref 0.0–1.2)
Total Protein: 5.6 g/dL — ABNORMAL LOW (ref 6.5–8.1)

## 2023-11-15 LAB — CBC WITH DIFFERENTIAL/PLATELET
Abs Immature Granulocytes: 0.04 10*3/uL (ref 0.00–0.07)
Basophils Absolute: 0 10*3/uL (ref 0.0–0.1)
Basophils Relative: 1 %
Eosinophils Absolute: 0.1 10*3/uL (ref 0.0–0.5)
Eosinophils Relative: 2 %
HCT: 36.4 % — ABNORMAL LOW (ref 39.0–52.0)
Hemoglobin: 12.4 g/dL — ABNORMAL LOW (ref 13.0–17.0)
Immature Granulocytes: 1 %
Lymphocytes Relative: 25 %
Lymphs Abs: 1.2 10*3/uL (ref 0.7–4.0)
MCH: 35 pg — ABNORMAL HIGH (ref 26.0–34.0)
MCHC: 34.1 g/dL (ref 30.0–36.0)
MCV: 102.8 fL — ABNORMAL HIGH (ref 80.0–100.0)
Monocytes Absolute: 0.6 10*3/uL (ref 0.1–1.0)
Monocytes Relative: 12 %
Neutro Abs: 2.8 10*3/uL (ref 1.7–7.7)
Neutrophils Relative %: 59 %
Platelets: 147 10*3/uL — ABNORMAL LOW (ref 150–400)
RBC: 3.54 MIL/uL — ABNORMAL LOW (ref 4.22–5.81)
RDW: 17.1 % — ABNORMAL HIGH (ref 11.5–15.5)
WBC: 4.7 10*3/uL (ref 4.0–10.5)
nRBC: 0 % (ref 0.0–0.2)

## 2023-11-15 LAB — MAGNESIUM: Magnesium: 1.9 mg/dL (ref 1.7–2.4)

## 2023-11-15 MED ORDER — FOLIC ACID 1 MG PO TABS: 1.0000 mg | ORAL_TABLET | Freq: Every day | ORAL | 0 refills | Status: DC

## 2023-11-15 MED ORDER — CHLORDIAZEPOXIDE HCL 25 MG PO CAPS
25.0000 mg | ORAL_CAPSULE | Freq: Every day | ORAL | Status: AC
  Administered 2023-11-15: 25 mg via ORAL
  Filled 2023-11-15: qty 1
  Filled 2023-11-15: qty 5

## 2023-11-15 MED ORDER — VITAMIN B-1 100 MG PO TABS: 100.0000 mg | ORAL_TABLET | Freq: Every day | ORAL | 0 refills | Status: DC

## 2023-11-15 NOTE — Discharge Summary (Signed)
 [name] WGN:[id number] DOB: 1970/12/22 DOA: 11/11/2023  PCP: Georganna Skeans, MD  Admit date: 11/11/2023  Discharge date: 11/15/2023  Admitted From: Home   Disposition:  Home   Recommendations for Outpatient Follow-up:   Follow up with PCP in 1-2 weeks  PCP Please obtain BMP/CBC, 2 view CXR in 1week,  (see Discharge instructions)   PCP Please follow up on the following pending results: As outpatient GI referral in 1 week   Home Health: None   Equipment/Devices: None  Consultations: None  Discharge Condition: Stable    CODE STATUS: Full    Diet Recommendation: Heart Healthy     Chief Complaint  Patient presents with   Seizures     Brief history of present illness from the day of admission and additional interim summary    53 y.o. male with chronic alcoholism, withdrawal seizures, hepatic steatosis, chronic anemia, schizoaffective disorder, anxiety, depression, PTSD Patient was brought to the ED this morning by EMS for a witnessed seizure activity. Patient was at a store and suddenly had a seizure activity witnessed by the workers there.  EMS was called and patient was brought to the ED.                                                                  Hospital Course   Alcohol withdrawal seizure - Brought into the ED after witnessed seizure-like activity, no headache head CT unremarkable, he has history of alcohol withdrawal seizures in the past, last alcoholic drink 2 days prior to hospital visit, also on Keppra at home and noncompliant with it. Counseled to abstain from alcohol, be compliant with Keppra, placed back on Keppra, currently seizure-free early DTs but completely resolved.  Symptom-free now eager to go home.  Strictly counseled to quit alcohol.   Chronic alcoholism - Hepatic steatosis  AST,  ALT, alk phos, total bilirubin elevated as expected with alcohol abuse and alcohol-related liver injury, counseled to quit alcohol outpatient PCP and GI follow-up.  Stable INR suggesting satisfactory liver synthetic function.  No acute issues requested to follow-up with GI in 1 to 2 weeks of discharge PCP to arrange.   Hypokalemia/hypomagnesemia/hyponatremia.  Hydrated with IV fluids, replace electrolytes.   Chronic macrocytic anemia  No acute issues, outpatient monitoring by PCP   Psychiatric disorders - schizoaffective disorder, anxiety, depression, PTSD - Continue home medications Seroquel, sertraline counseled on compliance   Gout  Allopurinol   Neuropathy  Gabapentin   GERD  PPI     Discharge diagnosis     Active Problems:   Alcohol withdrawal [facility])    Discharge instructions    Discharge Instructions     Ambulatory Referral for Lung Cancer Scre   Complete by: As directed    Diet - low sodium heart healthy   Complete by:  As directed    Discharge instructions   Complete by: As directed    Follow with Primary MD Georganna Skeans, MD in 7 days, get a referral for gastroenterologist within a week of discharge.  Get CBC, CMP, 2 view Chest X ray -  checked next visit with your primary M   Activity: As tolerated with Full fall precautions use walker/cane & assistance as needed  Disposition Home    Diet: Heart Healthy   Special Instructions: If you have smoked or chewed Tobacco  in the last 2 yrs please stop smoking, stop any regular Alcohol  and or any Recreational drug use.  On your next visit with your primary care physician please Get Medicines reviewed and adjusted.  Please request your Prim.MD to go over all Hospital Tests and Procedure/Radiological results at the follow up, please get all Hospital records sent to your Prim MD by signing hospital release before you go home.  If you experience worsening of your admission symptoms, develop shortness of breath, life  threatening emergency, suicidal or homicidal thoughts you must seek medical attention immediately by calling 911 or calling your MD immediately  if symptoms less severe.  You Must read complete instructions/literature along with all the possible adverse reactions/side effects for all the Medicines you take and that have been prescribed to you. Take any new Medicines after you have completely understood and accpet all the possible adverse reactions/side effects.   Do not drive when taking Pain medications.  Do not take more than prescribed Pain, Sleep and Anxiety Medications  Wear Seat belts while driving.   Increase activity slowly   Complete by: As directed        Discharge Medications   Allergies as of 11/15/2023       Reactions   Bupropion Other (See Comments)   Caused seizures Caused seizures Feel as though I will have a seizure when taking this med        Medication List     TAKE these medications    allopurinol 100 MG tablet Commonly known as: ZYLOPRIM Take 1 tablet (100 mg total) by mouth daily.   folic acid 1 MG tablet Commonly known as: FOLVITE Take 1 tablet (1 mg total) by mouth daily.   gabapentin 300 MG capsule Commonly known as: NEURONTIN Take 1 capsule (300 mg total) by mouth 3 (three) times daily.   levETIRAcetam 500 MG tablet Commonly known as: KEPPRA Take 1 tablet (500 mg total) by mouth 2 (two) times daily.   metoprolol succinate 25 MG 24 hr tablet Commonly known as: TOPROL-XL Take 1 tablet (25 mg total) by mouth daily. Hold if SBP <110 mmHg and or HR <65   pantoprazole 40 MG tablet Commonly known as: Protonix Take 1 tablet (40 mg total) by mouth daily.   QUEtiapine 200 MG tablet Commonly known as: SEROQUEL Take 1 tablet (200 mg total) by mouth at bedtime.   sertraline 25 MG tablet Commonly known as: ZOLOFT Take 1 tablet (25 mg total) by mouth daily.   spironolactone 100 MG tablet Commonly known as: ALDACTONE Take 1 tablet (100 mg  total) by mouth daily. Skip the dose if systolic BP less than 120 mmHg   thiamine 100 MG tablet Commonly known as: Vitamin B-1 Take 1 tablet (100 mg total) by mouth daily.         Follow-up Information     Georganna Skeans, MD. Schedule an appointment as soon as possible for a visit in 1 week(s).  Specialty: Family Medicine Contact information: [address] [place] [address] [phone]         Kathi Der, MD. Schedule an appointment as soon as possible for a visit in 1 week(s).   Specialty: Gastroenterology Contact information: [address] [place] [address] [phone]                 Major procedures and Radiology Reports - PLEASE review detailed and final reports thoroughly  -     US Abdomen Limited RUQ (LIVER/GB) Result Date: 11/14/2023 CLINICAL DATA:  [id number] Transaminitis [id number] EXAM: ULTRASOUND ABDOMEN LIMITED RIGHT UPPER QUADRANT COMPARISON:  March 09, 2023 FINDINGS: Gallbladder: No gallstones or wall thickening visualized. No sonographic Murphy sign noted by sonographer. Common bile duct: Not discretely visualized secondary to poor acoustic penetrance. No dilated bile duct is noted. Liver: No focal lesion identified within the limitations of the examination. Heterogeneous and diffusely increased in parenchymal echogenicity with poor acoustic penetration. Portal vein is patent on color Doppler imaging with normal direction of blood flow towards the liver. Other: None. IMPRESSION: Heterogeneous and diffusely increased hepatic parenchymal echogenicity as can be seen in hepatic steatosis. Electronically Signed   By: Meda Klinefelter M.D.   On: 11/14/2023 14:36   DG Chest Port 1 View Result Date: 11/12/2023 CLINICAL DATA:  Shortness of breath. EXAM: PORTABLE CHEST 1 VIEW COMPARISON:  Chest radiograph dated 11/11/2023. FINDINGS: The heart size and mediastinal contours are within normal limits. Both lungs are clear. The  visualized skeletal structures are unremarkable. IMPRESSION: No active disease. Electronically Signed   By: Elgie Collard M.D.   On: 11/12/2023 10:12   DG Chest Portable 1 View Result Date: 11/11/2023 CLINICAL DATA:  Shortness of breath EXAM: PORTABLE CHEST 1 VIEW COMPARISON:  07/23/2020 FINDINGS: The heart size and mediastinal contours are within normal limits. Both lungs are clear. The visualized skeletal structures are unremarkable. IMPRESSION: Normal study. Electronically Signed   By: Charlett Nose M.D.   On: 11/11/2023 10:31   CT HEAD WO CONTRAST ( ) Result Date: 11/11/2023 CLINICAL DATA:  Seizure, new-onset, no history of trauma. EXAM: CT HEAD WITHOUT CONTRAST TECHNIQUE: Contiguous axial images were obtained from the base of the skull through the vertex without intravenous contrast. RADIATION DOSE REDUCTION: This exam was performed according to the departmental dose-optimization program which includes automated exposure control, adjustment of the mA and/or kV according to patient size and/or use of iterative reconstruction technique. COMPARISON:  Head CT 03/07/2023 FINDINGS: Brain: There is no evidence of an acute infarct, intracranial hemorrhage, mass, midline shift, or extra-axial fluid collection. Mild cerebral atrophy is unchanged. Vascular: No suspicious focal vascular hyperdensity. Skull: No fracture or suspicious lesion. Sinuses/Orbits: Visualized paranasal sinuses and mastoid air cells are clear. Unremarkable included orbits. Other: None. IMPRESSION: No evidence of acute intracranial abnormality. Electronically Signed   By: Sebastian Ache M.D.   On: 11/11/2023 10:17    Micro Results    No results found for this or any previous visit (from the past 240 hours).  Today   Subjective    [name] today has no headache,no chest abdominal pain,no new weakness tingling or numbness, feels much better wants to go home today.    Objective   Blood pressure 103/66, pulse 87,  temperature 97.7 F (36.5 C), temperature source Oral, resp. rate 20, height 6' (1.829 m), weight 99.8 kg, SpO2 95%.   Intake/Output Summary (Last 24 hours) at 11/15/2023 0858 Last data filed at 11/15/2023 0037 Gross  per 24 hour  Intake 480 ml  Output 975 ml  Net -495 ml    Exam  Awake Alert, No new F.N deficits,    [place].AT,PERRAL Supple Neck,   Symmetrical Chest wall movement, Good air movement bilaterally, CTAB RRR,No Gallops,   +ve B.Sounds, Abd Soft, Non tender,  No Cyanosis, Clubbing or edema    Data Review   Recent Labs  Lab 11/11/23 0840 11/12/23 0446 11/13/23 0531 11/14/23 0515 11/15/23 0526  WBC 9.1 5.2 5.0 5.6 4.7  HGB 16.3 13.8 12.7* 12.5* 12.4*  HCT 47.4 40.7 37.8* 36.4* 36.4*  PLT 193 134* 120* 133* 147*  MCV 100.2* 102.5* 103.6* 103.7* 102.8*  MCH 34.5* 34.8* 34.8* 35.6* 35.0*  MCHC 34.4 33.9 33.6 34.3 34.1  RDW 16.7* 16.8* 16.7* 16.8* 17.1*  LYMPHSABS 1.4  --  2.0 1.4 1.2  MONOABS 0.7  --  0.4 0.6 0.6  EOSABS 0.0  --  0.1 0.1 0.1  BASOSABS 0.1  --  0.0 0.0 0.0    Recent Labs  Lab 11/11/23 0840 11/11/23 1553 11/12/23 0446 11/12/23 0828 11/13/23 0531 11/14/23 0515 11/15/23 0526  NA 131* 134* 136  --  139 134* 135  K 2.7* 3.1* 3.2*  --  4.1 3.8 3.9  CL 82* 90* 94*  --  104 104 107  CO2 27 32 31  --  23 22 22   ANIONGAP 22* 12 11  --  12 8 6   GLUCOSE 177* 89 99  --  91 106* 97  BUN 17 18 19   --  14 10 10   CREATININE 1.22 1.15 1.09  --  0.95 0.82 0.77  AST 141*  --  167*  --  206* 142* 103*  ALT 55*  --  60*  --  87* 81* 72*  ALKPHOS 139*  --  108  --  101 96 103  BILITOT 4.8*  --  5.3*  --  3.6* 2.4* 1.9*  ALBUMIN 3.5  --  2.9*  --  2.8* 2.7* 2.7*  INR 1.1  --   --  1.1  --   --   --   AMMONIA 76*  --   --  62* 51* 46*  --   BNP  --   --   --   --  52.0 89.7  --   MG 1.6*  --  2.1  --  1.8 1.4* 1.9  PHOS  --   --  3.7  --  3.1 2.8  --   CALCIUM 8.9 8.5* 8.6*  --  8.5* 8.8* 8.9    Total Time in preparing paper work, data evaluation and  todays exam - 35 minutes  Signature  -    Susa Raring M.D on 11/15/2023 at 8:58 AM   -  To page go to [email or web address]

## 2023-11-16 ENCOUNTER — Telehealth: Payer: Self-pay

## 2023-11-16 NOTE — Transitions of Care (Post Inpatient/ED Visit) (Signed)
   11/16/2023  Name: [name] MRN: [id number] DOB: Mar 20, 1971  Today's TOC FU Call Status: Today's TOC FU Call Status:: Successful TOC FU Call Completed TOC FU Call Complete Date: 11/16/23 Patient's Name and Date of Birth confirmed.  Transition Care Management Follow-up Telephone Call Date of Discharge: 11/15/23 Discharge Facility: [facility]) Type of Discharge: Inpatient Admission Primary Inpatient Discharge Diagnosis:: alcohol withdrawal seizure How have you been since you were released from the hospital?: Better (He said he just looses his balance a little when he walks and he has been using his cane) Any questions or concerns?: No  Items Reviewed: Did you receive and understand the discharge instructions provided?: Yes Medications obtained,verified, and reconciled?: No Medications Not Reviewed Reasons:: Other: (He said he has all of his medications and did not have any questions about the med regime and did not need to review the med list.) Any new allergies since your discharge?: No Dietary orders reviewed?: Yes Type of Diet Ordered:: heart healthy, low sodium Do you have support at home?: Yes People in Home: alone Name of Support/Comfort Primary Source: He said that his sister checks on him regularly  Medications Reviewed Today: Medications Reviewed Today   Medications were not reviewed in this encounter     Home Care and Equipment/Supplies: Were Home Health Services Ordered?: No Any new equipment or medical supplies ordered?: No  Functional Questionnaire: Do you need assistance with bathing/showering or dressing?: No Do you need assistance with meal preparation?: No Do you need assistance with eating?: No Do you have difficulty maintaining continence: No Do you need assistance with getting out of bed/getting out of a chair/moving?: Yes (ambulates with a cane) Do you have difficulty managing or taking your medications?: No  Follow up appointments  reviewed: PCP Follow-up appointment confirmed?: No MD Provider Line [phone] Given: No (there is a block on Dr Tawana Scale schedule.  WIll need to call patient back with appointment) Specialist Hospital Follow-up appointment confirmed?: No Reason Specialist Follow-Up Not Confirmed: Patient has Specialist Provider Number and will Call for Appointment (He has the phone number for GI and understands he needs to call to schedule an appointment) Do you need transportation to your follow-up appointment?: No Do you understand care options if your condition(s) worsen?: Yes-patient verbalized understanding    SIGNATURE Robyne Peers, RN

## 2023-11-16 NOTE — Telephone Encounter (Signed)
 I called the patient and informed him that he has been scheduled to see Dr Andrey Campanile at [facility] on 12/01/2023 @ 0940.

## 2023-11-17 ENCOUNTER — Other Ambulatory Visit: Payer: Self-pay | Admitting: Family Medicine

## 2023-12-01 ENCOUNTER — Ambulatory Visit (INDEPENDENT_AMBULATORY_CARE_PROVIDER_SITE_OTHER): Admitting: Family Medicine

## 2023-12-01 VITALS — BP 109/75 | HR 81 | Temp 97.7°F | Resp 16 | Ht 72.0 in | Wt 224.2 lb

## 2023-12-01 DIAGNOSIS — F251 Schizoaffective disorder, depressive type: Secondary | ICD-10-CM

## 2023-12-01 DIAGNOSIS — F1010 Alcohol abuse, uncomplicated: Secondary | ICD-10-CM | POA: Diagnosis not present

## 2023-12-01 DIAGNOSIS — G40909 Epilepsy, unspecified, not intractable, without status epilepticus: Secondary | ICD-10-CM

## 2023-12-01 DIAGNOSIS — Z09 Encounter for follow-up examination after completed treatment for conditions other than malignant neoplasm: Secondary | ICD-10-CM | POA: Diagnosis not present

## 2023-12-01 MED ORDER — LACTULOSE 20 GM/30ML PO SOLN: 15.0000 mL | Freq: Three times a day (TID) | ORAL | 5 refills | Status: DC

## 2023-12-01 NOTE — Progress Notes (Unsigned)
 Patient is her for HFU alcohol withdrawal. Patient said he has no other concerns today

## 2023-12-02 ENCOUNTER — Encounter: Payer: Self-pay | Admitting: Family Medicine
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
## 2024-01-08 ENCOUNTER — Emergency Department (HOSPITAL_COMMUNITY)

## 2024-01-08 ENCOUNTER — Other Ambulatory Visit: Payer: Self-pay

## 2024-01-08 ENCOUNTER — Inpatient Hospital Stay (HOSPITAL_COMMUNITY)

## 2024-01-08 ENCOUNTER — Encounter (HOSPITAL_COMMUNITY): Payer: Self-pay | Admitting: Emergency Medicine

## 2024-01-08 ENCOUNTER — Inpatient Hospital Stay (HOSPITAL_COMMUNITY)
Admission: EM | Admit: 2024-01-08 | Discharge: 2024-01-15 | DRG: 897 | Disposition: A | Discharge status: Home / Self Care | Attending: Internal Medicine | Admitting: Internal Medicine

## 2024-01-08 DIAGNOSIS — Z818 Family history of other mental and behavioral disorders: Secondary | ICD-10-CM

## 2024-01-08 DIAGNOSIS — N179 Acute kidney failure, unspecified: Secondary | ICD-10-CM | POA: Diagnosis present

## 2024-01-08 DIAGNOSIS — E860 Dehydration: Secondary | ICD-10-CM | POA: Diagnosis present

## 2024-01-08 DIAGNOSIS — E876 Hypokalemia: Secondary | ICD-10-CM | POA: Diagnosis not present

## 2024-01-08 DIAGNOSIS — Z888 Allergy status to other drugs, medicaments and biological substances status: Secondary | ICD-10-CM

## 2024-01-08 DIAGNOSIS — Z6372 Alcoholism and drug addiction in family: Secondary | ICD-10-CM

## 2024-01-08 DIAGNOSIS — Y900 Blood alcohol level of less than 20 mg/100 ml: Secondary | ICD-10-CM | POA: Diagnosis present

## 2024-01-08 DIAGNOSIS — F251 Schizoaffective disorder, depressive type: Secondary | ICD-10-CM | POA: Diagnosis present

## 2024-01-08 DIAGNOSIS — F39 Unspecified mood [affective] disorder: Secondary | ICD-10-CM | POA: Diagnosis present

## 2024-01-08 DIAGNOSIS — I959 Hypotension, unspecified: Secondary | ICD-10-CM | POA: Diagnosis not present

## 2024-01-08 DIAGNOSIS — I10 Essential (primary) hypertension: Secondary | ICD-10-CM | POA: Diagnosis not present

## 2024-01-08 DIAGNOSIS — Z8249 Family history of ischemic heart disease and other diseases of the circulatory system: Secondary | ICD-10-CM

## 2024-01-08 DIAGNOSIS — R7989 Other specified abnormal findings of blood chemistry: Secondary | ICD-10-CM | POA: Diagnosis present

## 2024-01-08 DIAGNOSIS — F4310 Post-traumatic stress disorder, unspecified: Secondary | ICD-10-CM | POA: Diagnosis present

## 2024-01-08 DIAGNOSIS — K838 Other specified diseases of biliary tract: Secondary | ICD-10-CM | POA: Diagnosis not present

## 2024-01-08 DIAGNOSIS — F1010 Alcohol abuse, uncomplicated: Secondary | ICD-10-CM | POA: Diagnosis present

## 2024-01-08 DIAGNOSIS — S0181XA Laceration without foreign body of other part of head, initial encounter: Secondary | ICD-10-CM | POA: Diagnosis present

## 2024-01-08 DIAGNOSIS — K7460 Unspecified cirrhosis of liver: Secondary | ICD-10-CM | POA: Diagnosis present

## 2024-01-08 DIAGNOSIS — G8929 Other chronic pain: Secondary | ICD-10-CM | POA: Diagnosis not present

## 2024-01-08 DIAGNOSIS — Z811 Family history of alcohol abuse and dependence: Secondary | ICD-10-CM

## 2024-01-08 DIAGNOSIS — S01112A Laceration without foreign body of left eyelid and periocular area, initial encounter: Secondary | ICD-10-CM | POA: Diagnosis present

## 2024-01-08 DIAGNOSIS — G40909 Epilepsy, unspecified, not intractable, without status epilepticus: Secondary | ICD-10-CM | POA: Diagnosis present

## 2024-01-08 DIAGNOSIS — K766 Portal hypertension: Secondary | ICD-10-CM | POA: Diagnosis not present

## 2024-01-08 DIAGNOSIS — J341 Cyst and mucocele of nose and nasal sinus: Secondary | ICD-10-CM | POA: Diagnosis not present

## 2024-01-08 DIAGNOSIS — F10239 Alcohol dependence with withdrawal, unspecified: Secondary | ICD-10-CM | POA: Diagnosis present

## 2024-01-08 DIAGNOSIS — R109 Unspecified abdominal pain: Secondary | ICD-10-CM | POA: Diagnosis not present

## 2024-01-08 DIAGNOSIS — M109 Gout, unspecified: Secondary | ICD-10-CM | POA: Diagnosis not present

## 2024-01-08 DIAGNOSIS — K219 Gastro-esophageal reflux disease without esophagitis: Secondary | ICD-10-CM | POA: Diagnosis not present

## 2024-01-08 DIAGNOSIS — K3189 Other diseases of stomach and duodenum: Secondary | ICD-10-CM | POA: Diagnosis present

## 2024-01-08 DIAGNOSIS — E871 Hypo-osmolality and hyponatremia: Secondary | ICD-10-CM | POA: Diagnosis not present

## 2024-01-08 DIAGNOSIS — K7011 Alcoholic hepatitis with ascites: Secondary | ICD-10-CM | POA: Diagnosis present

## 2024-01-08 DIAGNOSIS — F10930 Alcohol use, unspecified with withdrawal, uncomplicated: Principal | ICD-10-CM

## 2024-01-08 DIAGNOSIS — D696 Thrombocytopenia, unspecified: Secondary | ICD-10-CM | POA: Diagnosis present

## 2024-01-08 DIAGNOSIS — M792 Neuralgia and neuritis, unspecified: Secondary | ICD-10-CM

## 2024-01-08 DIAGNOSIS — G4489 Other headache syndrome: Secondary | ICD-10-CM | POA: Diagnosis not present

## 2024-01-08 DIAGNOSIS — K029 Dental caries, unspecified: Secondary | ICD-10-CM | POA: Diagnosis not present

## 2024-01-08 DIAGNOSIS — F1011 Alcohol abuse, in remission: Secondary | ICD-10-CM | POA: Diagnosis present

## 2024-01-08 DIAGNOSIS — S0990XA Unspecified injury of head, initial encounter: Secondary | ICD-10-CM | POA: Diagnosis not present

## 2024-01-08 DIAGNOSIS — R569 Unspecified convulsions: Secondary | ICD-10-CM

## 2024-01-08 DIAGNOSIS — Z79899 Other long term (current) drug therapy: Secondary | ICD-10-CM

## 2024-01-08 DIAGNOSIS — K7010 Alcoholic hepatitis without ascites: Secondary | ICD-10-CM | POA: Diagnosis present

## 2024-01-08 DIAGNOSIS — R0989 Other specified symptoms and signs involving the circulatory and respiratory systems: Secondary | ICD-10-CM | POA: Diagnosis not present

## 2024-01-08 DIAGNOSIS — R739 Hyperglycemia, unspecified: Secondary | ICD-10-CM

## 2024-01-08 DIAGNOSIS — F17210 Nicotine dependence, cigarettes, uncomplicated: Secondary | ICD-10-CM | POA: Diagnosis present

## 2024-01-08 DIAGNOSIS — I499 Cardiac arrhythmia, unspecified: Secondary | ICD-10-CM | POA: Diagnosis not present

## 2024-01-08 DIAGNOSIS — R7401 Elevation of levels of liver transaminase levels: Secondary | ICD-10-CM

## 2024-01-08 DIAGNOSIS — R17 Unspecified jaundice: Secondary | ICD-10-CM

## 2024-01-08 DIAGNOSIS — R220 Localized swelling, mass and lump, head: Secondary | ICD-10-CM | POA: Diagnosis not present

## 2024-01-08 DIAGNOSIS — I9589 Other hypotension: Secondary | ICD-10-CM | POA: Diagnosis not present

## 2024-01-08 DIAGNOSIS — S0993XA Unspecified injury of face, initial encounter: Secondary | ICD-10-CM | POA: Diagnosis not present

## 2024-01-08 LAB — URINALYSIS, ROUTINE W REFLEX MICROSCOPIC
Bilirubin Urine: NEGATIVE
Glucose, UA: NEGATIVE mg/dL
Hgb urine dipstick: NEGATIVE
Ketones, ur: NEGATIVE mg/dL
Leukocytes,Ua: NEGATIVE
Nitrite: NEGATIVE
Protein, ur: NEGATIVE mg/dL
Specific Gravity, Urine: 1.004 — ABNORMAL LOW (ref 1.005–1.030)
pH: 6 (ref 5.0–8.0)

## 2024-01-08 LAB — CBC WITH DIFFERENTIAL/PLATELET
Abs Immature Granulocytes: 0.04 10*3/uL (ref 0.00–0.07)
Abs Immature Granulocytes: 0.02 10*3/uL (ref 0.00–0.07)
Basophils Absolute: 0 10*3/uL (ref 0.0–0.1)
Basophils Absolute: 0 10*3/uL (ref 0.0–0.1)
Basophils Relative: 0 %
Basophils Relative: 0 %
Eosinophils Absolute: 0 10*3/uL (ref 0.0–0.5)
Eosinophils Absolute: 0 10*3/uL (ref 0.0–0.5)
Eosinophils Relative: 0 %
Eosinophils Relative: 0 %
HCT: 41.6 % (ref 39.0–52.0)
HCT: 41.4 % (ref 39.0–52.0)
Hemoglobin: 14.7 g/dL (ref 13.0–17.0)
Hemoglobin: 14.4 g/dL (ref 13.0–17.0)
Immature Granulocytes: 1 %
Immature Granulocytes: 0 %
Lymphocytes Relative: 12 %
Lymphocytes Relative: 24 %
Lymphs Abs: 0.9 10*3/uL (ref 0.7–4.0)
Lymphs Abs: 1.4 10*3/uL (ref 0.7–4.0)
MCH: 33.7 pg (ref 26.0–34.0)
MCH: 33.7 pg (ref 26.0–34.0)
MCHC: 35.3 g/dL (ref 30.0–36.0)
MCHC: 34.8 g/dL (ref 30.0–36.0)
MCV: 95.4 fL (ref 80.0–100.0)
MCV: 97 fL (ref 80.0–100.0)
Monocytes Absolute: 0.8 10*3/uL (ref 0.1–1.0)
Monocytes Absolute: 0.7 10*3/uL (ref 0.1–1.0)
Monocytes Relative: 11 %
Monocytes Relative: 11 %
Neutro Abs: 5.3 10*3/uL (ref 1.7–7.7)
Neutro Abs: 3.7 10*3/uL (ref 1.7–7.7)
Neutrophils Relative %: 76 %
Neutrophils Relative %: 65 %
Platelets: 64 10*3/uL — ABNORMAL LOW (ref 150–400)
Platelets: 59 10*3/uL — ABNORMAL LOW (ref 150–400)
RBC: 4.36 MIL/uL (ref 4.22–5.81)
RBC: 4.27 MIL/uL (ref 4.22–5.81)
RDW: 14.7 % (ref 11.5–15.5)
RDW: 14.8 % (ref 11.5–15.5)
WBC: 7 10*3/uL (ref 4.0–10.5)
WBC: 5.8 10*3/uL (ref 4.0–10.5)
nRBC: 0 % (ref 0.0–0.2)
nRBC: 0 % (ref 0.0–0.2)

## 2024-01-08 LAB — COMPREHENSIVE METABOLIC PANEL WITH GFR
ALT: 31 U/L (ref 0–44)
ALT: 29 U/L (ref 0–44)
AST: 69 U/L — ABNORMAL HIGH (ref 15–41)
AST: 61 U/L — ABNORMAL HIGH (ref 15–41)
Albumin: 3.6 g/dL (ref 3.5–5.0)
Albumin: 3.4 g/dL — ABNORMAL LOW (ref 3.5–5.0)
Alkaline Phosphatase: 93 U/L (ref 38–126)
Alkaline Phosphatase: 84 U/L (ref 38–126)
Anion gap: 18 — ABNORMAL HIGH (ref 5–15)
Anion gap: 13 (ref 5–15)
BUN: 13 mg/dL (ref 6–20)
BUN: 9 mg/dL (ref 6–20)
CO2: 28 mmol/L (ref 22–32)
CO2: 29 mmol/L (ref 22–32)
Calcium: 8.3 mg/dL — ABNORMAL LOW (ref 8.9–10.3)
Calcium: 8 mg/dL — ABNORMAL LOW (ref 8.9–10.3)
Chloride: 79 mmol/L — ABNORMAL LOW (ref 98–111)
Chloride: 88 mmol/L — ABNORMAL LOW (ref 98–111)
Creatinine, Ser: 1.28 mg/dL — ABNORMAL HIGH (ref 0.61–1.24)
Creatinine, Ser: 1.1 mg/dL (ref 0.61–1.24)
GFR, Estimated: >60 mL/min (ref >60)
GFR, Estimated: >60 mL/min (ref >60)
Glucose, Bld: 139 mg/dL — ABNORMAL HIGH (ref 70–99)
Glucose, Bld: 106 mg/dL — ABNORMAL HIGH (ref 70–99)
Potassium: 2.1 mmol/L — CL (ref 3.5–5.1)
Potassium: 3.1 mmol/L — ABNORMAL LOW (ref 3.5–5.1)
Sodium: 125 mmol/L — ABNORMAL LOW (ref 135–145)
Sodium: 130 mmol/L — ABNORMAL LOW (ref 135–145)
Total Bilirubin: 4.6 mg/dL — ABNORMAL HIGH (ref 0.0–1.2)
Total Bilirubin: 3.9 mg/dL — ABNORMAL HIGH (ref 0.0–1.2)
Total Protein: 6.3 g/dL — ABNORMAL LOW (ref 6.5–8.1)
Total Protein: 6.3 g/dL — ABNORMAL LOW (ref 6.5–8.1)

## 2024-01-08 LAB — MAGNESIUM
Magnesium: 0.8 mg/dL — CL (ref 1.7–2.4)
Magnesium: 2.7 mg/dL — ABNORMAL HIGH (ref 1.7–2.4)

## 2024-01-08 LAB — RAPID URINE DRUG SCREEN, HOSP PERFORMED
Amphetamines: NOT DETECTED
Barbiturates: NOT DETECTED
Benzodiazepines: NOT DETECTED
Cocaine: NOT DETECTED
Opiates: NOT DETECTED
Tetrahydrocannabinol: POSITIVE — AB

## 2024-01-08 LAB — LIPASE, BLOOD: Lipase: 31 U/L (ref 11–51)

## 2024-01-08 LAB — PROTIME-INR
INR: 1.1 (ref 0.8–1.2)
Prothrombin Time: 14.9 s (ref 11.4–15.2)

## 2024-01-08 LAB — ETHANOL: Alcohol, Ethyl (B): <15 mg/dL (ref <15)

## 2024-01-08 MED ORDER — ONDANSETRON HCL 4 MG PO TABS: 4.0000 mg | ORAL_TABLET | Freq: Four times a day (QID) | ORAL | Status: DC | PRN

## 2024-01-08 MED ORDER — THIAMINE MONONITRATE 100 MG PO TABS
100.0000 mg | ORAL_TABLET | Freq: Every day | ORAL | Status: DC
Start: 2024-01-08 — End: 2024-01-08
  Administered 2024-01-08: 100 mg via ORAL
  Filled 2024-01-08: qty 1

## 2024-01-08 MED ORDER — POTASSIUM CHLORIDE 10 MEQ/100ML IV SOLN
10.0000 meq | INTRAVENOUS | Status: AC
  Administered 2024-01-08 (×5): 10 meq via INTRAVENOUS
  Filled 2024-01-08 (×5): qty 100

## 2024-01-08 MED ORDER — LORAZEPAM 2 MG/ML IJ SOLN: 0.0000 mg | Freq: Four times a day (QID) | INTRAMUSCULAR | Status: DC

## 2024-01-08 MED ORDER — LORAZEPAM 2 MG/ML IJ SOLN
0.0000 mg | Freq: Two times a day (BID) | INTRAMUSCULAR | Status: DC
Start: 2024-01-10 — End: 2024-01-08

## 2024-01-08 MED ORDER — POTASSIUM CHLORIDE CRYS ER 20 MEQ PO TBCR
40.0000 meq | EXTENDED_RELEASE_TABLET | ORAL | Status: AC
  Administered 2024-01-08 (×2): 40 meq via ORAL
  Filled 2024-01-08 (×2): qty 2

## 2024-01-08 MED ORDER — LORAZEPAM 1 MG PO TABS
0.0000 mg | ORAL_TABLET | Freq: Four times a day (QID) | ORAL | Status: DC
  Administered 2024-01-08: 1 mg via ORAL
  Filled 2024-01-08: qty 1

## 2024-01-08 MED ORDER — LORAZEPAM 2 MG/ML IJ SOLN
1.0000 mg | Freq: Once | INTRAMUSCULAR | Status: AC
  Administered 2024-01-08: 1 mg via INTRAVENOUS
  Filled 2024-01-08: qty 1

## 2024-01-08 MED ORDER — ALLOPURINOL 100 MG PO TABS
100.0000 mg | ORAL_TABLET | Freq: Every day | ORAL | Status: DC
  Administered 2024-01-08 – 2024-01-15 (×8): 100 mg via ORAL
  Filled 2024-01-08 (×8): qty 1

## 2024-01-08 MED ORDER — LORAZEPAM 1 MG PO TABS: 0.0000 mg | ORAL_TABLET | Freq: Two times a day (BID) | ORAL | Status: DC

## 2024-01-08 MED ORDER — POLYETHYLENE GLYCOL 3350 17 G PO PACK: 17.0000 g | PACK | Freq: Every day | ORAL | Status: DC | PRN

## 2024-01-08 MED ORDER — GABAPENTIN 300 MG PO CAPS
300.0000 mg | ORAL_CAPSULE | Freq: Two times a day (BID) | ORAL | Status: DC
  Administered 2024-01-08 – 2024-01-15 (×14): 300 mg via ORAL
  Filled 2024-01-08 (×14): qty 1

## 2024-01-08 MED ORDER — THIAMINE MONONITRATE 100 MG PO TABS
100.0000 mg | ORAL_TABLET | Freq: Every day | ORAL | Status: DC
  Administered 2024-01-11 – 2024-01-15 (×5): 100 mg via ORAL
  Filled 2024-01-08 (×6): qty 1

## 2024-01-08 MED ORDER — QUETIAPINE FUMARATE 100 MG PO TABS
200.0000 mg | ORAL_TABLET | Freq: Every day | ORAL | Status: DC
  Administered 2024-01-08 – 2024-01-14 (×7): 200 mg via ORAL
  Filled 2024-01-08 (×7): qty 2

## 2024-01-08 MED ORDER — LORAZEPAM 2 MG/ML IJ SOLN
1.0000 mg | INTRAMUSCULAR | Status: AC | PRN
  Administered 2024-01-10: 4 mg via INTRAVENOUS
  Administered 2024-01-10: 2 mg via INTRAVENOUS
  Administered 2024-01-10: 3 mg via INTRAVENOUS
  Filled 2024-01-08 (×2): qty 2
  Filled 2024-01-08: qty 1

## 2024-01-08 MED ORDER — MAGNESIUM SULFATE 4 GM/100ML IV SOLN
4.0000 g | Freq: Once | INTRAVENOUS | Status: AC
  Administered 2024-01-08: 4 g via INTRAVENOUS
  Filled 2024-01-08: qty 100

## 2024-01-08 MED ORDER — LORAZEPAM 1 MG PO TABS
0.0000 mg | ORAL_TABLET | Freq: Four times a day (QID) | ORAL | Status: DC
  Administered 2024-01-08 – 2024-01-09 (×3): 1 mg via ORAL
  Administered 2024-01-09: 2 mg via ORAL
  Administered 2024-01-09: 1 mg via ORAL
  Filled 2024-01-08 (×5): qty 1
  Filled 2024-01-08: qty 2
  Filled 2024-01-08: qty 1

## 2024-01-08 MED ORDER — SERTRALINE HCL 25 MG PO TABS
25.0000 mg | ORAL_TABLET | Freq: Every day | ORAL | Status: DC
  Administered 2024-01-08 – 2024-01-15 (×8): 25 mg via ORAL
  Filled 2024-01-08 (×8): qty 1

## 2024-01-08 MED ORDER — ONDANSETRON HCL 4 MG/2ML IJ SOLN: 4.0000 mg | Freq: Four times a day (QID) | INTRAMUSCULAR | Status: DC | PRN

## 2024-01-08 MED ORDER — ADULT MULTIVITAMIN W/MINERALS CH
1.0000 | ORAL_TABLET | Freq: Every day | ORAL | Status: DC
  Administered 2024-01-08 – 2024-01-15 (×8): 1 via ORAL
  Filled 2024-01-08 (×8): qty 1

## 2024-01-08 MED ORDER — LEVETIRACETAM 500 MG PO TABS
500.0000 mg | ORAL_TABLET | Freq: Two times a day (BID) | ORAL | Status: DC
  Administered 2024-01-08 – 2024-01-15 (×14): 500 mg via ORAL
  Filled 2024-01-08 (×6): qty 2
  Filled 2024-01-08 (×2): qty 1
  Filled 2024-01-08 (×3): qty 2
  Filled 2024-01-08: qty 1
  Filled 2024-01-08 (×3): qty 2

## 2024-01-08 MED ORDER — LORAZEPAM 1 MG PO TABS
1.0000 mg | ORAL_TABLET | ORAL | Status: AC | PRN
  Administered 2024-01-08 (×3): 1 mg via ORAL
  Administered 2024-01-09: 2 mg via ORAL
  Administered 2024-01-10: 1 mg via ORAL
  Administered 2024-01-10: 2 mg via ORAL
  Filled 2024-01-08: qty 1
  Filled 2024-01-08: qty 2
  Filled 2024-01-08 (×3): qty 1

## 2024-01-08 MED ORDER — THIAMINE HCL 100 MG/ML IJ SOLN: 100.0000 mg | Freq: Every day | INTRAMUSCULAR | Status: DC

## 2024-01-08 MED ORDER — SODIUM CHLORIDE 0.9 % IV BOLUS
1000.0000 mL | Freq: Once | INTRAVENOUS | Status: AC
  Administered 2024-01-08: 1000 mL via INTRAVENOUS

## 2024-01-08 MED ORDER — PANTOPRAZOLE SODIUM 40 MG PO TBEC
40.0000 mg | DELAYED_RELEASE_TABLET | Freq: Every day | ORAL | Status: DC
  Administered 2024-01-08 – 2024-01-15 (×8): 40 mg via ORAL
  Filled 2024-01-08 (×8): qty 1

## 2024-01-08 MED ORDER — POTASSIUM CHLORIDE CRYS ER 20 MEQ PO TBCR
40.0000 meq | EXTENDED_RELEASE_TABLET | Freq: Once | ORAL | Status: AC
  Administered 2024-01-08: 40 meq via ORAL
  Filled 2024-01-08: qty 2

## 2024-01-08 MED ORDER — THIAMINE MONONITRATE 100 MG PO TABS: 100.0000 mg | ORAL_TABLET | Freq: Every day | ORAL | Status: DC

## 2024-01-08 MED ORDER — THIAMINE HCL 100 MG/ML IJ SOLN
500.0000 mg | INTRAVENOUS | Status: AC
Start: 2024-01-08 — End: 2024-01-11
  Administered 2024-01-08 – 2024-01-10 (×3): 500 mg via INTRAVENOUS
  Filled 2024-01-08 (×2): qty 5
  Filled 2024-01-08: qty 4

## 2024-01-08 MED ORDER — FOLIC ACID 1 MG PO TABS
1.0000 mg | ORAL_TABLET | Freq: Every day | ORAL | Status: DC
  Administered 2024-01-08 – 2024-01-15 (×8): 1 mg via ORAL
  Filled 2024-01-08 (×8): qty 1

## 2024-01-08 MED ORDER — THIAMINE HCL 100 MG/ML IJ SOLN
100.0000 mg | Freq: Every day | INTRAMUSCULAR | Status: DC
  Filled 2024-01-08: qty 2

## 2024-01-08 NOTE — Discharge Instructions (Signed)

## 2024-01-08 NOTE — ED Notes (Signed)
 Transport called for pt, courtesy call for arrival given.

## 2024-01-08 NOTE — ED Notes (Signed)
 Pt OTF with transport to [facility].

## 2024-01-08 NOTE — ED Notes (Signed)
 Spoke with Landa Pine, RN on 5W making aware pt en route

## 2024-01-08 NOTE — Progress Notes (Signed)
 CSW added substance abuse resources to patient's AVS.  Edwin Dada, MSW, LCSW Transitions of Care  Clinical Social Worker II [phone]

## 2024-01-08 NOTE — ED Provider Notes (Signed)
 [name] Provider Note   CSN: [id number] Arrival date & time: 01/08/24  0222     History  Chief Complaint  Patient presents with   Seizures    [name] is a 53 y.o. male.  The history is provided by the patient and the EMS personnel.  Seizures He has a history of hypertension, alcohol  abuse, seizures including a whole withdrawal seizures and comes in following what he believes was a seizure at home.  He does not actually remember anything but states that he woke up with a lot of blood on the floor.  He hurts all over.  He denies bit lip or tongue and denies bowel or bladder incontinence, but has not had any of these with prior seizures.  He has been compliant with his levetiracetam .  He does admit that he had been drinking heavily with last ethanol consumption 2 days ago.  He does admit to marijuana use but denies other drug use.  Last tetanus immunization is unknown.   Home Medications Prior to Admission medications   Medication Sig Start Date End Date Taking? Authorizing Provider  allopurinol  (ZYLOPRIM ) 100 MG tablet Take 1 tablet (100 mg total) by mouth daily. 03/12/23 03/11/24 Yes Althia Atlas, MD  gabapentin  (NEURONTIN ) 300 MG capsule Take 1 capsule (300 mg total) by mouth 3 (three) times daily. Patient taking differently: Take 300 mg by mouth 2 (two) times daily. 03/12/23 03/11/24 Yes Althia Atlas, MD  Lactulose  20 GM/30ML SOLN Take 15 mLs (10 g total) by mouth 3 (three) times daily. 12/01/23  Yes Abraham Abo, MD  levETIRAcetam  (KEPPRA ) 500 MG tablet Take 1 tablet (500 mg total) by mouth 2 (two) times daily. 03/12/23 03/11/24 Yes Althia Atlas, MD  naproxen  sodium (ALEVE ) 220 MG tablet Take 440 mg by mouth as needed (pain).   Yes [provider]  pantoprazole  (PROTONIX ) 40 MG tablet Take 1 tablet (40 mg total) by mouth daily. 03/12/23 03/11/24 Yes Althia Atlas, MD  QUEtiapine  (SEROQUEL ) 200 MG tablet Take 1 tablet (200  mg total) by mouth at bedtime. 03/12/23 03/11/24 Yes Althia Atlas, MD  sertraline  (ZOLOFT ) 25 MG tablet Take 1 tablet (25 mg total) by mouth daily. 03/12/23 03/11/24 Yes Althia Atlas, MD  spironolactone  (ALDACTONE ) 100 MG tablet Take 1 tablet (100 mg total) by mouth daily. Skip the dose if systolic BP less than 120 mmHg 03/12/23  Yes Althia Atlas, MD  metoprolol  succinate (TOPROL -XL) 25 MG 24 hr tablet Take 1 tablet (25 mg total) by mouth daily. Hold if SBP <110 mmHg and or HR <65 Patient not taking: Reported on 01/08/2024 03/12/23 03/11/24  Althia Atlas, MD      Allergies    Bupropion    Review of Systems   Review of Systems  Neurological:  Positive for seizures.  All other systems reviewed and are negative.   Physical Exam Updated Vital Signs BP 111/77 (BP Location: Left Arm)   Pulse 77   Temp 97.7 F (36.5 C) (Oral)   Resp 16   Ht 6' (1.829 m)   Wt 99.8 kg   SpO2 98%   BMI 29.84 kg/m  Physical Exam Vitals and nursing note reviewed.   53 year old male, resting comfortably and in no acute distress. Vital signs are significant for borderline elevated respiratory rate. Oxygen saturation is 97%, which is normal. Head is normocephalic.  There is a small laceration superior to the left eye, periorbital ecchymosis of the left eye but no  step-off of the orbital rim. PERRLA, EOMI. No evidence of pit lip or tongue. Neck is nontender and supple. Back is nontender. Lungs are clear without rales, wheezes, or rhonchi. Chest is nontender. Heart has regular rate and rhythm without murmur. Abdomen is soft, flat, nontender. Extremities: Ecchymosis are noted on the left forearm over the dorsum of the right hand.  Minimal ecchymosis is noted over the anterior aspect of the right knee without any swelling.  Discoloration from ecchymosis suggest that these are several days old.  No swelling or deformity noted, full passive range of motion is present of all joints without pain. Skin is warm and dry  without rash. Neurologic: Awake and alert and oriented, cranial nerves are intact, strength is 5/5 in all 4 extremities.  Moderate tremulousness is present.             ED Results / Procedures / Treatments   Labs (all labs ordered are listed, but only abnormal results are displayed) Labs Reviewed  COMPREHENSIVE METABOLIC PANEL WITH GFR - Abnormal; Notable for the following components:      Result Value   Sodium 125 (*)    Potassium 2.1 (*)    Chloride 79 (*)    Glucose, Bld 139 (*)    Creatinine, Ser 1.28 (*)    Calcium  8.3 (*)    Total Protein 6.3 (*)    AST 69 (*)    Total Bilirubin 4.6 (*)    Anion gap 18 (*)    All other components within normal limits  CBC WITH DIFFERENTIAL/PLATELET - Abnormal; Notable for the following components:   Platelets 64 (*)    All other components within normal limits  URINALYSIS, ROUTINE W REFLEX MICROSCOPIC - Abnormal; Notable for the following components:   Specific Gravity, Urine 1.004 (*)    All other components within normal limits  RAPID URINE DRUG SCREEN, HOSP PERFORMED - Abnormal; Notable for the following components:   Tetrahydrocannabinol POSITIVE (*)    All other components within normal limits  MAGNESIUM  - Abnormal; Notable for the following components:   Magnesium  0.8 (*)    All other components within normal limits  ETHANOL  LEVETIRACETAM  LEVEL    EKG None  Radiology CT Head Wo Contrast Result Date: 01/08/2024 CLINICAL DATA:  Moderate to severe head trauma, seizures EXAM: CT HEAD WITHOUT CONTRAST TECHNIQUE: Contiguous axial images were obtained from the base of the skull through the vertex without intravenous contrast. RADIATION DOSE REDUCTION: This exam was performed according to the departmental dose-optimization program which includes automated exposure control, adjustment of the mA and/or kV according to patient size and/or use of iterative reconstruction technique. COMPARISON:  11/11/2023 FINDINGS: Brain: No  evidence of acute infarction, hemorrhage, hydrocephalus, extra-axial collection or mass lesion/mass effect. Brain atrophy especially affecting the cerebellum Vascular: No hyperdense vessel or unexpected calcification. Skull: Normal. Negative for fracture or focal lesion. Sinuses/Orbits: No evidence of injury IMPRESSION: No acute finding. Electronically Signed   By: Ronnette Coke M.D.   On: 01/08/2024 07:08    Procedures .Laceration Repair  Date/Time: 01/08/2024 3:52 AM  Performed by: Alissa April, MD Authorized by: Alissa April, MD   Consent:    Consent obtained:  Verbal   Consent given by:  Patient   Risks, benefits, and alternatives were discussed: yes     Risks discussed:  Infection, pain and poor cosmetic result   Alternatives discussed:  No treatment Universal protocol:    Procedure explained and questions answered to patient or proxy's satisfaction: yes  Relevant documents present and verified: yes     Test results available: yes     Imaging studies available: yes     Required blood products, implants, devices, and special equipment available: yes     Site/side marked: yes     Immediately prior to procedure, a time out was called: yes     Patient identity confirmed:  Verbally with patient and arm band Anesthesia:    Anesthesia method:  None Laceration details:    Location:  Face   Face location:  L eyebrow   Length (cm):  1.5   Depth (mm):  3 Pre-procedure details:    Preparation:  Patient was prepped and draped in usual sterile fashion and imaging obtained to evaluate for foreign bodies Exploration:    Limited defect created (wound extended): no     Hemostasis achieved with:  Direct pressure   Imaging obtained: x-ray     Imaging outcome: foreign body not noted     Wound exploration: entire depth of wound visualized     Wound extent: no foreign body and no underlying fracture     Contaminated: no   Treatment:    Area cleansed with:  Saline   Amount of cleaning:   Standard   Debridement:  None   Undermining:  None   Scar revision: no   Skin repair:    Repair method:  Tissue adhesive Approximation:    Approximation:  Close Repair type:    Repair type:  Simple Post-procedure details:    Dressing:  Open (no dressing)   Cardiac monitor shows normal sinus rhythm, per my interpretation.  Medications Ordered in ED Medications  LORazepam  (ATIVAN ) injection 0-4 mg (0 mg Intravenous Hold 01/08/24 0425)    Or  LORazepam  (ATIVAN ) tablet 0-4 mg ( Oral See Alternative 01/08/24 0425)  LORazepam  (ATIVAN ) injection 0-4 mg (has no administration in time range)    Or  LORazepam  (ATIVAN ) tablet 0-4 mg (has no administration in time range)  thiamine  (VITAMIN B1) tablet 100 mg (has no administration in time range)    Or  thiamine  (VITAMIN B1) injection 100 mg (has no administration in time range)  potassium chloride  10 mEq in 100 mL IVPB (0 mEq Intravenous Stopped 01/08/24 0732)  sodium chloride  0.9 % bolus 1,000 mL (0 mLs Intravenous Stopped 01/08/24 0544)  LORazepam  (ATIVAN ) injection 1 mg (1 mg Intravenous Given 01/08/24 0334)  potassium chloride  SA (KLOR-CON  M) CR tablet 40 mEq (40 mEq Oral Given 01/08/24 0522)  magnesium  sulfate IVPB 4 g 100 mL (0 g Intravenous Stopped 01/08/24 0732)    ED Course/ Medical Decision Making/ A&P                                 Medical Decision Making Amount and/or Complexity of Data Reviewed Labs: ordered. Radiology: ordered.  Risk OTC drugs. Prescription drug management. Decision regarding hospitalization.   Seizure which most likely is alcohol  withdrawal seizure.  Patient shows marked tremulousness consistent with alcohol  withdrawal, and he claims compliance with levetiracetam .  I have ordered some IV fluids and an intravenous dose of lorazepam  and have ordered he be placed on CIWA protocol.  Because of evident of head trauma, I am ordering CT of head.  I have reviewed his past records and note Tdap on 01/10/2019, no  indication for Tdap booster.  I note hospitalization on 11/11/2023-11/15/2023 for alcohol  withdrawal syndrome with seizures.  Laceration is closed with  tissue adhesive.  CT of head shows no acute injury.  Independently viewed the images, and agree with the radiologist's interpretation.  I have reviewed his laboratory tests, and my interpretation is hyponatremia, severe hypokalemia, elevated random glucose level, acute kidney injury with creatinine 1.28 compared with baseline 0.8, elevated AST which is chronic and related to alcohol  abuse, elevated total bilirubin which is also chronic but worse than baseline, severe hypomagnesemia, moderate to severe thrombocytopenia which is significantly worse than baseline but not at a dangerous level.  Urinalysis is normal, drug screen positive for THC and otherwise negative.  I have ordered intravenous magnesium  and potassium as well as oral potassium.  He will need to be admitted in light of his severe electrolyte disturbance.  I have discussed the case with Dr. Amy Kansky of [facility], who agrees to admit the patient.  CRITICAL CARE Performed by: Alissa April Total critical care time: 95 minutes Critical care time was exclusive of separately billable procedures and treating other patients. Critical care was necessary to treat or prevent imminent or life-threatening deterioration. Critical care was time spent personally by me on the following activities: development of treatment plan with patient and/or surrogate as well as nursing, discussions with consultants, evaluation of patient's response to treatment, examination of patient, obtaining history from patient or surrogate, ordering and performing treatments and interventions, ordering and review of laboratory studies, ordering and review of radiographic studies, pulse oximetry and re-evaluation of patient's condition.  Final Clinical Impression(s) / ED Diagnoses Final diagnoses:  Alcohol  withdrawal syndrome  without complication (HCC)  Alcohol  withdrawal seizure without complication (HCC)  Forehead laceration, initial encounter  Hyponatremia  Hypokalemia  Hypomagnesemia  Acute kidney injury (nontraumatic) (HCC)  Elevated AST (SGOT)  Serum total bilirubin elevated  Elevated random blood glucose level  Thrombocytopenia (HCC)    Rx / DC Orders ED Discharge Orders     None         Alissa April, MD 01/08/24 [phone]

## 2024-01-08 NOTE — ED Triage Notes (Signed)
 Pt to ED via [facility] from home c/o seizures.  Per EMS pt has seizures from withdrawals, last ETOH per pt Monday or Tuesday.  Compliant with keppra .  Pt believes had 1 seizure tonight because he woke up on the floor, sustained lac to left eyebrow, knot to left forearm and multiple bruises.  Last prior seizure in March.  No incontinence or oral trauma noted.  Pt A&Ox4 on arrival.

## 2024-01-08 NOTE — H&P (Signed)
 History and Physical    [name] JYN:[id number] DOB: 1970/12/27 DOA: 01/08/2024  PCP: Abraham Abo, MD  Patient coming from: home  I have personally briefly reviewed patient's old medical records in [facility]  Chief Complaint: suspected withdrawal seizure  HPI: [name] is [name] 53 y.o. male with medical history significant of etoh abuse, cirrhosis (portal HTN, esophageal varices, ascites), schizoaffective disorder, seizure disorder and other medical issues here after suspected seizure.  He notes that he stopped drinking and [name] few days later "I think I had [name] seizure".  "I stopped because I have cirrhosis, I can't be doing that."  He notes that he was "on Mekenzie Modeste bender" this month, drinking 5 twenty four oz 4 lokos Rhett Najera day.  On further discussion, sounds like he had nausea/vomiting and abdominal discomfort that also contributed to his decision to stop drinking.  He doesn't remember events around suspected seizure, but he woke up disoriented in [name] doorway.  There was blood everywhere from his L eye laceration.  At this time he has some discomfort related to his IV and abdominal discomfort described as pins/needles/constant - nothing improving or worsening.  He's having Alisi Lupien hard time sleeping due to withdrawal symptoms.  No fever.  Notes chills.  Mild cough (smoker).  No CP or SOB.  No numbness, tingling, weakness.  Occasional MJ in addition to smoking and etoh use.   ED Course: Labs, imaging, lac repair.  Admit for breakthrough seizure.  Carryover.   Review of Systems: As per HPI otherwise all other systems reviewed and are negative.  Past Medical History:  Diagnosis Date   Alcohol  abuse    Anxiety    at age 85   Blood transfusion without reported diagnosis    Cirrhosis (HCC)    Depression    at age 84   GERD (gastroesophageal reflux disease)    Hypertension    Nausea and vomiting    Psychosis (HCC)    PTSD (post-traumatic stress disorder)    Schizoaffective disorder     Seizure disorder (HCC)    related to etoh seizure   Symptomatic anemia     Past Surgical History:  Procedure Laterality Date   ESOPHAGOGASTRODUODENOSCOPY (EGD) WITH PROPOFOL  N/Yovanna Cogan 05/05/2020   Procedure: ESOPHAGOGASTRODUODENOSCOPY (EGD) WITH PROPOFOL ;  Surgeon: Sergio Dandy, MD;  Location: [facility];  Service: Endoscopy;  Laterality: N/Vertie Dibbern;   FOOT SURGERY     right   HAND SURGERY      Social History  reports that he has been smoking cigarettes. He has [name] 45 pack-year smoking history. He has never used smokeless tobacco. He reports current alcohol  use. He reports current drug use. Frequency: 1.00 time per week. Drug: Marijuana.  Allergies  Allergen Reactions   Bupropion Other (See Comments)    Caused seizures Caused seizures Feel as though I will have Cylinda Santoli seizure when taking this med    Family History  Problem Relation Age of Onset   Alcoholism Mother    CAD Father    Depression Brother    Colon polyps Neg Hx    Esophageal cancer Neg Hx    Pancreatic cancer Neg Hx    Stomach cancer Neg Hx    Prior to Admission medications   Medication Sig Start Date End Date Taking? Authorizing Provider  allopurinol  (ZYLOPRIM ) 100 MG tablet Take 1 tablet (100 mg total) by mouth daily. 03/12/23 03/11/24 Yes Althia Atlas, MD  gabapentin  (NEURONTIN ) 300 MG capsule Take 1 capsule (300 mg total) by mouth 3 (  three) times daily. Patient taking differently: Take 300 mg by mouth 2 (two) times daily. 03/12/23 03/11/24 Yes Althia Atlas, MD  Lactulose  20 GM/30ML SOLN Take 15 mLs (10 g total) by mouth 3 (three) times daily. 12/01/23  Yes Abraham Abo, MD  levETIRAcetam  (KEPPRA ) 500 MG tablet Take 1 tablet (500 mg total) by mouth 2 (two) times daily. 03/12/23 03/11/24 Yes Althia Atlas, MD  naproxen  sodium (ALEVE ) 220 MG tablet Take 440 mg by mouth as needed (pain).   Yes [provider]  pantoprazole  (PROTONIX ) 40 MG tablet Take 1 tablet (40 mg total) by mouth daily. 03/12/23 03/11/24 Yes Althia Atlas, MD  QUEtiapine  (SEROQUEL ) 200 MG tablet Take 1 tablet (200 mg total) by mouth at bedtime. 03/12/23 03/11/24 Yes Althia Atlas, MD  sertraline  (ZOLOFT ) 25 MG tablet Take 1 tablet (25 mg total) by mouth daily. 03/12/23 03/11/24 Yes Althia Atlas, MD  spironolactone  (ALDACTONE ) 100 MG tablet Take 1 tablet (100 mg total) by mouth daily. Skip the dose if systolic BP less than 120 mmHg 03/12/23  Yes Althia Atlas, MD  metoprolol  succinate (TOPROL -XL) 25 MG 24 hr tablet Take 1 tablet (25 mg total) by mouth daily. Hold if SBP <110 mmHg and or HR <65 Patient not taking: Reported on 01/08/2024 03/12/23 03/11/24  Althia Atlas, MD    Physical Exam: Vitals:   01/08/24 0847 01/08/24 0848 01/08/24 0927 01/08/24 1108  BP: (!) 114/102 (!) 114/102 114/77 97/71  Pulse: 66 70  70  Resp:  17    Temp:      TempSrc:      SpO2:  98%    Weight:      Height:        Constitutional: appears uncomfortable, in withdrawal Vitals:   01/08/24 0847 01/08/24 0848 01/08/24 0927 01/08/24 1108  BP: (!) 114/102 (!) 114/102 114/77 97/71  Pulse: 66 70  70  Resp:  17    Temp:      TempSrc:      SpO2:  98%    Weight:      Height:       Eyes: PERRL ENMT: Mucous membranes are moist. Neck: normal, supple Respiratory: unlabored Cardiovascular: RRR Abdomen: mild TTP, most in RUQ Musculoskeletal: mild TTP over L clavicle, shoulder.  No TTP of upper extremities or lower extremities.  Stable hips.  No midline back TTP.  Skin: periorbital bruising/swelling to L eye, bruising to L elbow, R knuckles, R forearm Neurologic: CN 2-12 grossly intact. Sensation intact, DTR normal. Moving all extremities.  Tremor.  Psychiatric: Normal judgment and insight. Alert and oriented x 3. Normal mood.   Labs on Admission: I have personally reviewed following labs and imaging studies  CBC: Recent Labs  Lab 01/08/24 0245 01/08/24 0845  WBC 7.0 5.8  NEUTROABS 5.3 3.7  HGB 14.7 14.4  HCT 41.6 41.4  MCV 95.4 97.0  PLT 64* 59*     Basic Metabolic Panel: Recent Labs  Lab 01/08/24 0245 01/08/24 0845  NA 125* 130*  K 2.1* 3.1*  CL 79* 88*  CO2 28 29  GLUCOSE 139* 106*  BUN 13 9  CREATININE 1.28* 1.10  CALCIUM  8.3* 8.0*  MG 0.8* 2.7*    GFR: Estimated Creatinine Clearance: 97.2 mL/min (by C-G formula based on SCr of 1.1 mg/dL).  Liver Function Tests: Recent Labs  Lab 01/08/24 0245 01/08/24 0845  AST 69* 61*  ALT 31 29  ALKPHOS 93 84  BILITOT 4.6* 3.9*  PROT 6.3* 6.3*  ALBUMIN  3.6 3.4*  Urine analysis:    Component Value Date/Time   COLORURINE YELLOW 01/08/2024 0347   APPEARANCEUR CLEAR 01/08/2024 0347   LABSPEC 1.004 (L) 01/08/2024 0347   PHURINE 6.0 01/08/2024 0347   GLUCOSEU NEGATIVE 01/08/2024 0347   HGBUR NEGATIVE 01/08/2024 0347   BILIRUBINUR NEGATIVE 01/08/2024 0347   KETONESUR NEGATIVE 01/08/2024 0347   PROTEINUR NEGATIVE 01/08/2024 0347   UROBILINOGEN 1.0 12/24/2014 0643   NITRITE NEGATIVE 01/08/2024 0347   LEUKOCYTESUR NEGATIVE 01/08/2024 0347    Radiological Exams on Admission: DG Shoulder Left Result Date: 01/08/2024 CLINICAL DATA:  [id number] Abdominal pain [id number].  Seizures. EXAM: LEFT SHOULDER - 2+ VIEW COMPARISON:  None Available. FINDINGS: No acute fracture. No aggressive osseous lesion. Glenohumeral and acromioclavicular joints alignment is within normal limits. No significant degenerative changes. No soft tissue swelling. No radiopaque foreign bodies. IMPRESSION: No acute osseous abnormality of the left shoulder. Electronically Signed   By: Beula Brunswick M.D.   On: 01/08/2024 10:52   DG CHEST PORT 1 VIEW Result Date: 01/08/2024 CLINICAL DATA:  [id number] Abdominal pain [id number] EXAM: PORTABLE CHEST 1 VIEW COMPARISON:  11/12/2023. FINDINGS: Low lung volume. Bilateral lung fields are clear. Bilateral costophrenic angles are clear. Stable cardio-mediastinal silhouette. No acute osseous abnormalities. The soft tissues are within normal limits. No free air under the domes of  diaphragm. IMPRESSION: No active disease. Electronically Signed   By: Beula Brunswick M.D.   On: 01/08/2024 10:51   US  Abdomen Limited RUQ (LIVER/GB) Result Date: 01/08/2024 CLINICAL DATA:  [id number] Abdominal pain [id number]. EXAM: ULTRASOUND ABDOMEN LIMITED RIGHT UPPER QUADRANT COMPARISON:  None Available. FINDINGS: Gallbladder: Physiologically distended. Small-to-moderate volume sludge noted. No gallstones. No abnormal wall thickening or pericholecystic free fluid. Sonographic Murphy sign was negative as per the technologist. Common bile duct: Diameter: Up to 3.7 mm.  No intrahepatic bile duct dilation. Liver: There is poor sound beam penetration to the deep / posterior aspects of the liver as Job Holtsclaw result of increased hepatic echogenicity which reduces the sensitivity of ultrasound for the detection of focal masses. That being said, no focal mass is identified. Portal vein is patent on color Doppler imaging with normal direction of blood flow towards the liver. Other: None. IMPRESSION: *Small-to-moderate volume gallbladder sludge without sonographic evidence of acute cholecystitis. *Increased hepatic echogenicity, Abubakar Crispo nonspecific finding that is most commonly seen on the basis of steatosis in the absence of known liver disease. Electronically Signed   By: Beula Brunswick M.D.   On: 01/08/2024 10:30   CT Head Wo Contrast Result Date: 01/08/2024 CLINICAL DATA:  Moderate to severe head trauma, seizures EXAM: CT HEAD WITHOUT CONTRAST TECHNIQUE: Contiguous axial images were obtained from the base of the skull through the vertex without intravenous contrast. RADIATION DOSE REDUCTION: This exam was performed according to the departmental dose-optimization program which includes automated exposure control, adjustment of the mA and/or kV according to patient size and/or use of iterative reconstruction technique. COMPARISON:  11/11/2023 FINDINGS: Brain: No evidence of acute infarction, hemorrhage, hydrocephalus, extra-axial  collection or mass lesion/mass effect. Brain atrophy especially affecting the cerebellum Vascular: No hyperdense vessel or unexpected calcification. Skull: Normal. Negative for fracture or focal lesion. Sinuses/Orbits: No evidence of injury IMPRESSION: No acute finding. Electronically Signed   By: Ronnette Coke M.D.   On: 01/08/2024 07:08    EKG: Independently reviewed. pending  Assessment/Plan Principal Problem:   Seizure (HCC)    Assessment and Plan:  Complicated Alcohol  Withdrawal  Alcohol  Withdrawal Seizures Stopped drinking Monday or Tuesday.  Was drinking 5  twenty four oz four loko Lyzbeth Genrich day for about 1 month prior to this. BIB EMS after waking up on floor confused - suspected seizure given his history CIWA, most recent 11 Continue ativan  scheduled/prn  Thiamine , folate, MVI  Encourage abstinence  Traumatic Fall in the Setting of Suspected Etoh withdrawal seizure Left Periorbial Laceration (s/p repair with tissue adhesive) Periorbital bruising/swelling to L eye, bruising to L elbow, R knuckles and forearm Mildly TTP to L shoulder/clavicle - follow plain films Follow CT maxillofacial  Head CT without acute findings No significant TTP at extremities where he has bruising PT/OT  Abdominal Pain  Nausea  Vomiting I believe this was why he stopped drinking Follow lipase.  Possible pancreatitis, etoh gastritis.   Consider imaging if worsening, mild at this time. RUQ US  pending Continue PPI   Alcoholic Hepatitis  Cirrhosis  Portal Hypertensive Gastropathy  Ascites  Thrombocytopenia Maddrey discriminant function low, no need for steroids US  3/29 with hepatic steatosis Had EGD in 2021 with portal hypertensive gastropathy, grade 1 esophageal varices Low platelets related to etoh/cirrhosis.  Elevated bilirubin.  Albumin  only mildly low and normal INR.   Follow US  abd for ascites Needs abstinence and GI follow up (last saw GI 1/20224, at that time they referred him to liver  clinic in town) Holding spironolactone  for now, appears euvolemic  Hyponatremia Noted, improved with IVF  Hypokalemia  Hypomagnesemia Replace and follow   Seizure Disorder Continue keppra  Breakthrough seizure in setting of etoh withdrawal above  Gout Allopurinol   Chronic Pain Gabapentin   Mood Disorder Schizoaffective Disorder Continue nightly seroquel  and zoloft  Follow EKG for Qtc eval     DVT prophylaxis: SCD's (severe thrombocytopenia)  Code Status:   full  Family Communication:  none  Disposition Plan:   Patient is from:  home  Anticipated DC to:  home  Anticipated DC date:  Pending improvemetn  Anticipated DC barriers: Pending improvement, resolution of etoh withdrawal  Consults called:  none  Admission status:  inpatient   Severity of Illness: The appropriate patient status for this patient is INPATIENT. Inpatient status is judged to be reasonable and necessary in order to provide the required intensity of service to ensure the patient's safety. The patient's presenting symptoms, physical exam findings, and initial radiographic and laboratory data in the context of their chronic comorbidities is felt to place them at high risk for further clinical deterioration. Furthermore, it is not anticipated that the patient will be medically stable for discharge from the hospital within 2 midnights of admission.   * I certify that at the point of admission it is my clinical judgment that the patient will require inpatient hospital care spanning beyond 2 midnights from the point of admission due to high intensity of service, high risk for further deterioration and high frequency of surveillance required.Donnetta Gains MD [facility]  How to contact the [facility] Attending or Consulting provider 7A - 7P or covering provider during after hours 7P -7A, for this patient?   Check the care team in [facility] and look for Brailen Macneal) attending/consulting [facility] provider listed and b) the [facility]  team listed Log into [email or web address] and use [facility]'s universal password to access. If you do not have the password, please contact the hospital operator. Locate the [facility] provider you are looking for under [facility] and page to Mikell Kazlauskas number that you can be directly reached. If you still have difficulty reaching the provider, please page the [facility] (Director on Call) for the Hospitalists  listed on amion for assistance.  01/08/2024, 11:32 AM

## 2024-01-09 DIAGNOSIS — R569 Unspecified convulsions: Secondary | ICD-10-CM | POA: Diagnosis not present

## 2024-01-09 LAB — COMPREHENSIVE METABOLIC PANEL WITH GFR
ALT: 38 U/L (ref 0–44)
AST: 95 U/L — ABNORMAL HIGH (ref 15–41)
Albumin: 3.1 g/dL — ABNORMAL LOW (ref 3.5–5.0)
Alkaline Phosphatase: 78 U/L (ref 38–126)
Anion gap: 11 (ref 5–15)
BUN: 13 mg/dL (ref 6–20)
CO2: 27 mmol/L (ref 22–32)
Calcium: 8.5 mg/dL — ABNORMAL LOW (ref 8.9–10.3)
Chloride: 97 mmol/L — ABNORMAL LOW (ref 98–111)
Creatinine, Ser: 0.86 mg/dL (ref 0.61–1.24)
GFR, Estimated: >60 mL/min (ref >60)
Glucose, Bld: 88 mg/dL (ref 70–99)
Potassium: 3.3 mmol/L — ABNORMAL LOW (ref 3.5–5.1)
Sodium: 135 mmol/L (ref 135–145)
Total Bilirubin: 3.6 mg/dL — ABNORMAL HIGH (ref 0.0–1.2)
Total Protein: 5.8 g/dL — ABNORMAL LOW (ref 6.5–8.1)

## 2024-01-09 LAB — CBC
HCT: 38.5 % — ABNORMAL LOW (ref 39.0–52.0)
Hemoglobin: 13.1 g/dL (ref 13.0–17.0)
MCH: 33.6 pg (ref 26.0–34.0)
MCHC: 34 g/dL (ref 30.0–36.0)
MCV: 98.7 fL (ref 80.0–100.0)
Platelets: 66 10*3/uL — ABNORMAL LOW (ref 150–400)
RBC: 3.9 MIL/uL — ABNORMAL LOW (ref 4.22–5.81)
RDW: 15.1 % (ref 11.5–15.5)
WBC: 3.7 10*3/uL — ABNORMAL LOW (ref 4.0–10.5)
nRBC: 0 % (ref 0.0–0.2)

## 2024-01-09 LAB — AMMONIA: Ammonia: 45 umol/L — ABNORMAL HIGH (ref 9–35)

## 2024-01-09 LAB — BRAIN NATRIURETIC PEPTIDE: B Natriuretic Peptide: 54.3 pg/mL (ref 0.0–100.0)

## 2024-01-09 LAB — OSMOLALITY, URINE: Osmolality, Ur: 604 mosm/kg (ref 300–900)

## 2024-01-09 LAB — SODIUM, URINE, RANDOM: Sodium, Ur: 90 mmol/L

## 2024-01-09 LAB — PHOSPHORUS: Phosphorus: 3 mg/dL (ref 2.5–4.6)

## 2024-01-09 LAB — URIC ACID: Uric Acid, Serum: 9.9 mg/dL — ABNORMAL HIGH (ref 3.7–8.6)

## 2024-01-09 LAB — MAGNESIUM: Magnesium: 2.1 mg/dL (ref 1.7–2.4)

## 2024-01-09 LAB — OSMOLALITY: Osmolality: 286 mosm/kg (ref 275–295)

## 2024-01-09 LAB — CREATININE, URINE, RANDOM: Creatinine, Urine: 284 mg/dL

## 2024-01-09 MED ORDER — POTASSIUM CHLORIDE CRYS ER 20 MEQ PO TBCR
40.0000 meq | EXTENDED_RELEASE_TABLET | Freq: Once | ORAL | Status: AC
  Administered 2024-01-09: 40 meq via ORAL
  Filled 2024-01-09: qty 2

## 2024-01-09 MED ORDER — LACTULOSE 10 GM/15ML PO SOLN
10.0000 g | Freq: Three times a day (TID) | ORAL | Status: DC
  Administered 2024-01-09 – 2024-01-11 (×9): 10 g via ORAL
  Filled 2024-01-09 (×9): qty 30

## 2024-01-09 MED ORDER — METOPROLOL SUCCINATE ER 25 MG PO TB24: 25.0000 mg | ORAL_TABLET | Freq: Every day | ORAL | Status: DC

## 2024-01-09 MED ORDER — CHLORDIAZEPOXIDE HCL 5 MG PO CAPS
20.0000 mg | ORAL_CAPSULE | Freq: Three times a day (TID) | ORAL | Status: DC
  Administered 2024-01-09 – 2024-01-10 (×6): 20 mg via ORAL
  Filled 2024-01-09 (×7): qty 4

## 2024-01-09 NOTE — Progress Notes (Signed)
 PROGRESS NOTE                                                                                                                                                                                                             Patient Demographics:    [name], is a 53 y.o. male, DOB - 06-01-1971, WJX:[id number]  Outpatient Primary MD for the patient is Abraham Abo, MD    LOS - 1  Admit date - 01/08/2024    Chief Complaint  Patient presents with   Seizures       Brief Narrative (HPI from H&P)    53 y.o. male with medical history significant of etoh abuse, cirrhosis (portal HTN, esophageal varices, ascites), schizoaffective disorder, seizure disorder and other medical issues here after suspected seizure. He notes that he stopped drinking and a few days later "I think I had a seizure". "I stopped because I have cirrhosis, I can't be doing that." He notes that he was "on a bender" this month, drinking 5 twenty four oz 4 lokos a day. On further discussion, sounds like he had nausea/vomiting and abdominal discomfort that also contributed to his decision to stop drinking. He doesn't remember events around suspected seizure, but he woke up disoriented in a doorway. There was blood everywhere from his L eye laceration.    Subjective:    [name] today has, No headache, No chest pain, No abdominal pain - No Nausea, No new weakness tingling or numbness, no SOB   Assessment  & Plan :    Complicated Alcohol  Withdrawal  Alcohol  Withdrawal Seizures Strictly counseled to quit alcohol , this is a recurrent problem, on Librium  and CIWA protocol.   Traumatic Fall in the Setting of Suspected Etoh withdrawal seizure Left Periorbial Laceration (s/p repair with tissue adhesive) CT head and maxillofacial no acute changes, PT eval, supportive care.   Abdominal Pain  Nausea  Vomiting Much improved after supportive care continue PPI and  antiemetics, stable lipase   Alcoholic Hepatitis.  Fatty liver versus alcoholic cirrhosis.  Portal Hypertensive Gastropathy  Ascites  Chronic thrombocytopenia Maddrey discriminant function low, no need for steroids, ultrasound stable, continue to monitor with supportive care.  Resume Aldactone  upon discharge was dehydrated upon admission.  Outpatient [facility] follow-up postdischarge.  Hyponatremia Due to dehydration resolved after IV fluids  Hypokalemia  Hypomagnesemia Replaced  Seizure Disorder Continue keppra  Breakthrough seizure in setting of etoh withdrawal above   Gout Allopurinol    Chronic Pain Gabapentin    Mood Disorder Schizoaffective Disorder Continue nightly seroquel  and zoloft   GERD.  On PPI.        Condition - Fair  Family Communication  : None present  Code Status :  Full  Consults  :   None  PUD Prophylaxis :  PPI   Procedures  :     CT head, abdominal ultrasound.  Nonacute.      Disposition Plan  :    Status is: Inpatient   DVT Prophylaxis  :    SCDs Start: 01/08/24 2002    Lab Results  Component Value Date   PLT 66 (L) 01/09/2024    Diet :  Diet Order             Diet regular Room service appropriate? Yes; Fluid consistency: Thin  Diet effective now                    Inpatient Medications  Scheduled Meds:  allopurinol   100 mg Oral Daily   chlordiazePOXIDE   20 mg Oral TID   folic acid   1 mg Oral Daily   gabapentin   300 mg Oral BID   levETIRAcetam   500 mg Oral BID   LORazepam   0-4 mg Oral Q6H   Followed by   Cecily Cohen ON 01/10/2024] LORazepam   0-4 mg Oral Q12H   multivitamin with minerals  1 tablet Oral Daily   pantoprazole   40 mg Oral Daily   potassium chloride   40 mEq Oral Once   QUEtiapine   200 mg Oral QHS   sertraline   25 mg Oral Daily   [START ON 01/11/2024] thiamine   100 mg Oral Daily   Continuous Infusions:  thiamine  (VITAMIN B1) injection Stopped (01/08/24 1549)   PRN Meds:.LORazepam  **OR**  LORazepam , ondansetron  **OR** ondansetron  (ZOFRAN ) IV, polyethylene glycol  Antibiotics  :    Anti-infectives (From admission, onward)    None         Objective:   Vitals:   01/09/24 0031 01/09/24 0348 01/09/24 0437 01/09/24 0838  BP:  92/74 101/74 110/69  Pulse:  81 83 83  Resp: 16 16 20    Temp: 98.4 F (36.9 C) 97.6 F (36.4 C)  97.9 F (36.6 C)  TempSrc: Oral Oral  Oral  SpO2:  94% 93% 92%  Weight:      Height:        Wt Readings from Last 3 Encounters:  01/08/24 94.3 kg  12/01/23 101.7 kg  11/11/23 99.8 kg     Intake/Output Summary (Last 24 hours) at 01/09/2024 0940 Last data filed at 01/08/2024 2300 Gross per 24 hour  Intake 360 ml  Output 400 ml  Net -40 ml     Physical Exam  Awake Alert, No new F.N deficits, in early DTs L periorbital hematoma, mild Supple Neck, No JVD,   Symmetrical Chest wall movement, Good air movement bilaterally, CTAB RRR,No Gallops,Rubs or new Murmurs,  +ve B.Sounds, Abd Soft, No tenderness,   No Cyanosis, Clubbing or edema       Data Review:    Recent Labs  Lab 01/08/24 0245 01/08/24 0845 01/09/24 0601  WBC 7.0 5.8 3.7*  HGB 14.7 14.4 13.1  HCT 41.6 41.4 38.5*  PLT 64* 59* 66*  MCV 95.4 97.0 98.7  MCH 33.7 33.7 33.6  MCHC 35.3 34.8 34.0  RDW 14.7 14.8 15.1  LYMPHSABS 0.9 1.4  --  MONOABS 0.8 0.7  --   EOSABS 0.0 0.0  --   BASOSABS 0.0 0.0  --     Recent Labs  Lab 01/08/24 0245 01/08/24 0845 01/09/24 0558 01/09/24 0601  NA 125* 130*  --  135  K 2.1* 3.1*  --  3.3*  CL 79* 88*  --  97*  CO2 28 29  --  27  ANIONGAP 18* 13  --  11  GLUCOSE 139* 106*  --  88  BUN 13 9  --  13  CREATININE 1.28* 1.10  --  0.86  AST 69* 61*  --  95*  ALT 31 29  --  38  ALKPHOS 93 84  --  78  BILITOT 4.6* 3.9*  --  3.6*  ALBUMIN  3.6 3.4*  --  3.1*  INR  --  1.1  --   --   AMMONIA  --   --   --  45*  BNP  --   --   --  54.3  MG 0.8* 2.7* 2.1  --   PHOS  --   --  3.0  --   CALCIUM  8.3* 8.0*  --  8.5*       Recent Labs  Lab 01/08/24 0245 01/08/24 0845 01/09/24 0558 01/09/24 0601  INR  --  1.1  --   --   AMMONIA  --   --   --  45*  BNP  --   --   --  54.3  MG 0.8* 2.7* 2.1  --   CALCIUM  8.3* 8.0*  --  8.5*    --------------------------------------------------------------------------------------------------------------- Lab Results  Component Value Date   CHOL 159 03/02/2019   HDL 53 03/02/2019   LDLCALC 84 03/02/2019   TRIG 164 (H) 07/21/2020   CHOLHDL 3.0 03/02/2019    Lab Results  Component Value Date   HGBA1C 5.5 09/07/2021      Radiology Report US  ASCITES (ABDOMEN LIMITED) Result Date: 01/08/2024 CLINICAL DATA:  Abdominal pain EXAM: LIMITED ABDOMEN ULTRASOUND FOR ASCITES TECHNIQUE: Limited ultrasound survey for ascites was performed in all four abdominal quadrants. COMPARISON:  None Available. FINDINGS: No significant ascites in the 4 quadrants of the abdomen by ultrasound. IMPRESSION: No significant ascites seen at this time. Electronically Signed   By: Adrianna Horde M.D.   On: 01/08/2024 13:53   CT MAXILLOFACIAL WO CONTRAST Result Date: 01/08/2024 CLINICAL DATA:  Facial trauma EXAM: CT MAXILLOFACIAL WITHOUT CONTRAST TECHNIQUE: Multidetector CT imaging of the maxillofacial structures was performed. Multiplanar CT image reconstructions were also generated. RADIATION DOSE REDUCTION: This exam was performed according to the departmental dose-optimization program which includes automated exposure control, adjustment of the mA and/or kV according to patient size and/or use of iterative reconstruction technique. COMPARISON:  Same-day head CT. FINDINGS: Osseous: Maxilla: Intact.  Periapical lucencies of left maxillary molars. Pterygoid Plates: Intact Zygomatic Arch: Intact Orbits: Intact Ethmoid: Intact Sphenoid: Intact Frontal:Intact Mandible: Intact. No condylar dislocation. Periapical lucencies of left mandibular molars. Dental caries. Nasal: Intact Nasal Septum: Intact. Rightward  bowing of the nasal septum without mass effect on the turbinates. Orbits: The globes are intact. Lenses are normally located. Normal appearance of the extraocular muscles and optic nerve sheath complexes. Intraorbital fat is unremarkable. There is a small focus of soft tissue swelling along the lateral aspect of the left orbit with partial extension into the superolateral preseptal soft tissues. Sinuses: Mucosal thickening in the alveolar recesses of both maxillary sinuses which may be odontogenic in origin. Additional small mucous  retention cyst in the right maxillary sinus. No air-fluid levels. Soft tissues: Left periorbital soft tissue swelling. Limited intracranial: No significant or unexpected finding. IMPRESSION: No acute maxillofacial fracture. Left periorbital soft tissue swelling as above. Dental caries.  Multiple periapical lucencies. Electronically Signed   By: Denny Flack M.D.   On: 01/08/2024 13:06   DG Shoulder Left Result Date: 01/08/2024 CLINICAL DATA:  [id number] Abdominal pain [id number].  Seizures. EXAM: LEFT SHOULDER - 2+ VIEW COMPARISON:  None Available. FINDINGS: No acute fracture. No aggressive osseous lesion. Glenohumeral and acromioclavicular joints alignment is within normal limits. No significant degenerative changes. No soft tissue swelling. No radiopaque foreign bodies. IMPRESSION: No acute osseous abnormality of the left shoulder. Electronically Signed   By: Beula Brunswick M.D.   On: 01/08/2024 10:52   DG CHEST PORT 1 VIEW Result Date: 01/08/2024 CLINICAL DATA:  [id number] Abdominal pain [id number] EXAM: PORTABLE CHEST 1 VIEW COMPARISON:  11/12/2023. FINDINGS: Low lung volume. Bilateral lung fields are clear. Bilateral costophrenic angles are clear. Stable cardio-mediastinal silhouette. No acute osseous abnormalities. The soft tissues are within normal limits. No free air under the domes of diaphragm. IMPRESSION: No active disease. Electronically Signed   By: Beula Brunswick M.D.   On:  01/08/2024 10:51   US  Abdomen Limited RUQ (LIVER/GB) Result Date: 01/08/2024 CLINICAL DATA:  [id number] Abdominal pain [id number]. EXAM: ULTRASOUND ABDOMEN LIMITED RIGHT UPPER QUADRANT COMPARISON:  None Available. FINDINGS: Gallbladder: Physiologically distended. Small-to-moderate volume sludge noted. No gallstones. No abnormal wall thickening or pericholecystic free fluid. Sonographic Murphy sign was negative as per the technologist. Common bile duct: Diameter: Up to 3.7 mm.  No intrahepatic bile duct dilation. Liver: There is poor sound beam penetration to the deep / posterior aspects of the liver as a result of increased hepatic echogenicity which reduces the sensitivity of ultrasound for the detection of focal masses. That being said, no focal mass is identified. Portal vein is patent on color Doppler imaging with normal direction of blood flow towards the liver. Other: None. IMPRESSION: *Small-to-moderate volume gallbladder sludge without sonographic evidence of acute cholecystitis. *Increased hepatic echogenicity, a nonspecific finding that is most commonly seen on the basis of steatosis in the absence of known liver disease. Electronically Signed   By: Beula Brunswick M.D.   On: 01/08/2024 10:30   CT Head Wo Contrast Result Date: 01/08/2024 CLINICAL DATA:  Moderate to severe head trauma, seizures EXAM: CT HEAD WITHOUT CONTRAST TECHNIQUE: Contiguous axial images were obtained from the base of the skull through the vertex without intravenous contrast. RADIATION DOSE REDUCTION: This exam was performed according to the departmental dose-optimization program which includes automated exposure control, adjustment of the mA and/or kV according to patient size and/or use of iterative reconstruction technique. COMPARISON:  11/11/2023 FINDINGS: Brain: No evidence of acute infarction, hemorrhage, hydrocephalus, extra-axial collection or mass lesion/mass effect. Brain atrophy especially affecting the cerebellum Vascular: No  hyperdense vessel or unexpected calcification. Skull: Normal. Negative for fracture or focal lesion. Sinuses/Orbits: No evidence of injury IMPRESSION: No acute finding. Electronically Signed   By: Ronnette Coke M.D.   On: 01/08/2024 07:08     Signature  -   Lynnwood Sauer M.D on 01/09/2024 at 9:40 AM   -  To page go to [email or web address]

## 2024-01-09 NOTE — Evaluation (Signed)
 Physical Therapy Evaluation Patient Details Name: [name] MRN: [id number] DOB: November 20, 1970 Today's Date: 01/09/2024  History of Present Illness  53 y.o. male Admitted 5/23 with Alcohol  Withdrawal Seizures. Traumatic Fall in the Setting of Suspected Etoh withdrawal seizure, Left Periorbial Laceration (s/p repair with tissue adhesive). with medical history significant of etoh abuse, cirrhosis (portal HTN, esophageal varices, ascites), schizoaffective disorder, seizure disorder.   Clinical Impression  Pt admitted with above diagnosis. Demonstrates moderate fall risk with BERG balance test. Ambulates 100 feet with supervision, no assistive device, mild instability with antalgic pattern. Educated on findings and safety awareness. Pt currently with functional limitations due to the deficits listed below (see PT Problem List). Pt will benefit from acute skilled PT to increase their independence and safety with mobility to allow discharge.           If plan is discharge home, recommend the following: A little help with walking and/or transfers;Assist for transportation;Help with stairs or ramp for entrance;A little help with bathing/dressing/bathroom;Assistance with cooking/housework   Can travel by private vehicle        Equipment Recommendations None recommended by PT  Recommendations for Other Services       Functional Status Assessment Patient has had a recent decline in their functional status and demonstrates the ability to make significant improvements in function in a reasonable and predictable amount of time.     Precautions / Restrictions Precautions Precautions: Fall Recall of Precautions/Restrictions: Intact Restrictions Weight Bearing Restrictions Per Provider Order: No      Mobility  Bed Mobility Overal bed mobility: Modified Independent             General bed mobility comments: ModI with HOB elevated    Transfers Overall transfer level: Needs  assistance Equipment used: None Transfers: Sit to/from Stand Sit to Stand: Supervision           General transfer comment: Supervision for safety, a little slow to rise. Performs from bed and toilet.    Ambulation/Gait Ambulation/Gait assistance: Supervision Gait Distance (Feet): 100 Feet Assistive device: None Gait Pattern/deviations: Step-through pattern, Wide base of support, Decreased stride length, Antalgic Gait velocity: decr Gait velocity interpretation: <1.8 ft/sec, indicate of risk for recurrent falls   General Gait Details: Challenged with dynamic tasks including backing up, turns, high knee march, and stepping over obstacles. Performed at supervision level without assistive device. Pt agreeable to [facility] use at home for increased support due to mild instability and antalgic pattern. Educated on findings, safety awareness.  Stairs            Wheelchair Mobility     Tilt Bed    Modified Rankin (Stroke Patients Only)       Balance Overall balance assessment: Needs assistance Sitting-balance support: No upper extremity supported, Feet supported Sitting balance-Leahy Scale: Good     Standing balance support: During functional activity, No upper extremity supported Standing balance-Leahy Scale: Fair Standing balance comment: able to stand at toilet and wash hands without support.                 Standardized Balance Assessment Standardized Balance Assessment : Berg Balance Test Berg Balance Test Sit to Stand: Able to stand without using hands and stabilize independently Standing Unsupported: Able to stand safely 2 minutes Sitting with Back Unsupported but Feet Supported on Floor or Stool: Able to sit safely and securely 2 minutes Stand to Sit: Sits safely with minimal use of hands Transfers: Able to transfer safely, minor use of hands  Standing Unsupported with Eyes Closed: Able to stand 10 seconds safely Standing Ubsupported with Feet Together: Able  to place feet together independently and stand for 1 minute with supervision From Standing, Reach Forward with Outstretched Arm: Can reach confidently >25 cm (10") From Standing Position, Pick up Object from Floor: Able to pick up shoe safely and easily From Standing Position, Turn to Look Behind Over each Shoulder: Looks behind from both sides and weight shifts well Turn 360 Degrees: Able to turn 360 degrees safely in 4 seconds or less Standing Unsupported, Alternately Place Feet on Step/Stool: Able to complete >2 steps/needs minimal assist Standing Unsupported, One Foot in Front: Able to take small step independently and hold 30 seconds Standing on One Leg: Tries to lift leg/unable to hold 3 seconds but remains standing independently Total Score: 47         Pertinent Vitals/Pain Pain Assessment Pain Assessment: Faces Faces Pain Scale: Hurts little more Pain Location: All over, especially Lt shoulder but only to touch. Pain Descriptors / Indicators: Aching Pain Intervention(s): Monitored during session, Repositioned, Limited activity within patient's tolerance    Home Living Family/patient expects to be discharged to:: Private residence Living Arrangements: Alone Available Help at Discharge: Family;Available PRN/intermittently;Friend(s) Type of Home: House Home Access: Stairs to enter Entrance Stairs-Rails: Right;Left;Can reach both Entrance Stairs-Number of Steps: 2   Home Layout: One level Home Equipment: Agricultural consultant (2 wheels);Shower seat;Cane - single point      Prior Function Prior Level of Function : Independent/Modified Independent;Driving (on disability)             Mobility Comments: ModI with SP cane, reports LOC and memory loss during time frame of the seizure ADLs Comments: Indepedent, cooks own meals, wears glasses     Extremity/Trunk Assessment   Upper Extremity Assessment Upper Extremity Assessment: Defer to OT evaluation    Lower Extremity  Assessment Lower Extremity Assessment: Generalized weakness    Cervical / Trunk Assessment Cervical / Trunk Assessment: Normal  Communication   Communication Communication: No apparent difficulties    Cognition Arousal: Alert Behavior During Therapy: WFL for tasks assessed/performed   PT - Cognitive impairments: No family/caregiver present to determine baseline, Problem solving                         Following commands: Intact       Cueing Cueing Techniques: Verbal cues     General Comments General comments (skin integrity, edema, etc.): Bruise, Lt flank, bil knees, Lt shoulder. BP 110/69; SpO2 94% on RA, HR 100.    Exercises     Assessment/Plan    PT Assessment Patient needs continued PT services  PT Problem List Decreased activity tolerance;Decreased balance;Decreased mobility;Decreased strength;Decreased coordination;Decreased knowledge of use of DME;Pain       PT Treatment Interventions DME instruction;Gait training;Stair training;Functional mobility training;Balance training;Therapeutic activities;Therapeutic exercise;Neuromuscular re-education;Patient/family education    PT Goals (Current goals can be found in the Care Plan section)  Acute Rehab PT Goals Patient Stated Goal: Get well PT Goal Formulation: With patient Time For Goal Achievement: 01/23/24 Potential to Achieve Goals: Good    Frequency Min 2X/week     Co-evaluation               AM-PAC PT "6 Clicks" Mobility  Outcome Measure Help needed turning from your back to your side while in a flat bed without using bedrails?: None Help needed moving from lying on your back to sitting on the  side of a flat bed without using bedrails?: None Help needed moving to and from a bed to a chair (including a wheelchair)?: A Little Help needed standing up from a chair using your arms (e.g., wheelchair or bedside chair)?: A Little Help needed to walk in hospital room?: A Little Help needed  climbing 3-5 steps with a railing? : A Little 6 Click Score: 20    End of Session Equipment Utilized During Treatment: Gait belt Activity Tolerance: Patient tolerated treatment well Patient left: with bed alarm set;in bed;with call bell/phone within reach Nurse Communication: Mobility status PT Visit Diagnosis: Other abnormalities of gait and mobility (R26.89);Repeated falls (R29.6);Muscle weakness (generalized) (M62.81);History of falling (Z91.81);Other symptoms and signs involving the nervous system (R29.898);Pain Pain - Right/Left: Left Pain - part of body: Shoulder    Time: 1020-1040 PT Time Calculation (min) (ACUTE ONLY): 20 min   Charges:   PT Evaluation $PT Eval Low Complexity: 1 Low   PT General Charges $$ ACUTE PT VISIT: 1 Visit         Jory Ng, PT, DPT [facility]  Rehabilitation Services Physical Therapist Office: [phone] Website: [email or web address]   Alinda Irani 01/09/2024, 10:59 AM

## 2024-01-10 DIAGNOSIS — R569 Unspecified convulsions: Secondary | ICD-10-CM | POA: Diagnosis not present

## 2024-01-10 LAB — COMPREHENSIVE METABOLIC PANEL WITH GFR
ALT: 70 U/L — ABNORMAL HIGH (ref 0–44)
AST: 164 U/L — ABNORMAL HIGH (ref 15–41)
Albumin: 3.1 g/dL — ABNORMAL LOW (ref 3.5–5.0)
Alkaline Phosphatase: 83 U/L (ref 38–126)
Anion gap: 9 (ref 5–15)
BUN: 12 mg/dL (ref 6–20)
CO2: 23 mmol/L (ref 22–32)
Calcium: 8.8 mg/dL — ABNORMAL LOW (ref 8.9–10.3)
Chloride: 101 mmol/L (ref 98–111)
Creatinine, Ser: 0.99 mg/dL (ref 0.61–1.24)
GFR, Estimated: >60 mL/min (ref >60)
Glucose, Bld: 105 mg/dL — ABNORMAL HIGH (ref 70–99)
Potassium: 3.4 mmol/L — ABNORMAL LOW (ref 3.5–5.1)
Sodium: 133 mmol/L — ABNORMAL LOW (ref 135–145)
Total Bilirubin: 2.7 mg/dL — ABNORMAL HIGH (ref 0.0–1.2)
Total Protein: 5.8 g/dL — ABNORMAL LOW (ref 6.5–8.1)

## 2024-01-10 LAB — CBC WITH DIFFERENTIAL/PLATELET
Abs Immature Granulocytes: 0.01 10*3/uL (ref 0.00–0.07)
Basophils Absolute: 0 10*3/uL (ref 0.0–0.1)
Basophils Relative: 1 %
Eosinophils Absolute: 0 10*3/uL (ref 0.0–0.5)
Eosinophils Relative: 1 %
HCT: 36.8 % — ABNORMAL LOW (ref 39.0–52.0)
Hemoglobin: 12.5 g/dL — ABNORMAL LOW (ref 13.0–17.0)
Immature Granulocytes: 0 %
Lymphocytes Relative: 31 %
Lymphs Abs: 1.6 10*3/uL (ref 0.7–4.0)
MCH: 33.5 pg (ref 26.0–34.0)
MCHC: 34 g/dL (ref 30.0–36.0)
MCV: 98.7 fL (ref 80.0–100.0)
Monocytes Absolute: 0.7 10*3/uL (ref 0.1–1.0)
Monocytes Relative: 13 %
Neutro Abs: 2.7 10*3/uL (ref 1.7–7.7)
Neutrophils Relative %: 54 %
Platelets: 84 10*3/uL — ABNORMAL LOW (ref 150–400)
RBC: 3.73 MIL/uL — ABNORMAL LOW (ref 4.22–5.81)
RDW: 15.2 % (ref 11.5–15.5)
WBC: 5.1 10*3/uL (ref 4.0–10.5)
nRBC: 0 % (ref 0.0–0.2)

## 2024-01-10 LAB — PHOSPHORUS: Phosphorus: 2.1 mg/dL — ABNORMAL LOW (ref 2.5–4.6)

## 2024-01-10 LAB — AMMONIA: Ammonia: 63 umol/L — ABNORMAL HIGH (ref 9–35)

## 2024-01-10 LAB — MAGNESIUM: Magnesium: 1.4 mg/dL — ABNORMAL LOW (ref 1.7–2.4)

## 2024-01-10 LAB — UREA NITROGEN, URINE: Urea Nitrogen, Ur: 771 mg/dL

## 2024-01-10 MED ORDER — POTASSIUM PHOSPHATES 15 MMOLE/5ML IV SOLN
30.0000 mmol | Freq: Once | INTRAVENOUS | Status: AC
  Administered 2024-01-10: 30 mmol via INTRAVENOUS
  Filled 2024-01-10: qty 10

## 2024-01-10 MED ORDER — FENTANYL CITRATE PF 50 MCG/ML IJ SOSY
25.0000 ug | PREFILLED_SYRINGE | Freq: Once | INTRAMUSCULAR | Status: AC
  Administered 2024-01-10: 25 ug via INTRAVENOUS

## 2024-01-10 MED ORDER — PHENOBARBITAL SODIUM 130 MG/ML IJ SOLN
130.0000 mg | Freq: Three times a day (TID) | INTRAMUSCULAR | Status: DC
  Administered 2024-01-10 (×3): 130 mg via INTRAVENOUS
  Filled 2024-01-10 (×4): qty 1

## 2024-01-10 MED ORDER — MAGNESIUM SULFATE 4 GM/100ML IV SOLN
4.0000 g | Freq: Once | INTRAVENOUS | Status: AC
  Administered 2024-01-10: 4 g via INTRAVENOUS
  Filled 2024-01-10: qty 100

## 2024-01-10 MED ORDER — POTASSIUM CHLORIDE CRYS ER 20 MEQ PO TBCR
40.0000 meq | EXTENDED_RELEASE_TABLET | Freq: Once | ORAL | Status: AC
  Administered 2024-01-10: 40 meq via ORAL
  Filled 2024-01-10: qty 2

## 2024-01-10 MED ORDER — LACTATED RINGERS IV SOLN: INTRAVENOUS | Status: DC

## 2024-01-10 MED ORDER — NICOTINE 21 MG/24HR TD PT24
21.0000 mg | MEDICATED_PATCH | Freq: Every day | TRANSDERMAL | Status: DC
  Administered 2024-01-10 – 2024-01-14 (×5): 21 mg via TRANSDERMAL
  Filled 2024-01-10 (×6): qty 1

## 2024-01-10 MED ORDER — FENTANYL CITRATE PF 50 MCG/ML IJ SOSY
PREFILLED_SYRINGE | INTRAMUSCULAR | Status: AC
Start: 2024-01-10 — End: 2024-01-10
  Filled 2024-01-10: qty 1

## 2024-01-10 NOTE — Plan of Care (Signed)

## 2024-01-10 NOTE — Progress Notes (Signed)
 PROGRESS NOTE                                                                                                                                                                                                             Patient Demographics:    [name], is a 53 y.o. male, DOB - 12-Feb-1971, [id number]  Outpatient Primary MD for the patient is Abraham Abo, MD    LOS - 2  Admit date - 01/08/2024    Chief Complaint  Patient presents with   Seizures       Brief Narrative (HPI from H&P)    53 y.o. male with medical history significant of etoh abuse, cirrhosis (portal HTN, esophageal varices, ascites), schizoaffective disorder, seizure disorder and other medical issues here after suspected seizure. He notes that he stopped drinking and a few days later "I think I had a seizure". "I stopped because I have cirrhosis, I can't be doing that." He notes that he was "on a bender" this month, drinking 5 twenty four oz 4 lokos a day. On further discussion, sounds like he had nausea/vomiting and abdominal discomfort that also contributed to his decision to stop drinking. He doesn't remember events around suspected seizure, but he woke up disoriented in a doorway. There was blood everywhere from his L eye laceration.    Subjective:   Patient in bed, significantly confused due to full-blown DTs, denies any headache or chest pain, no shortness of breath.   Assessment  & Plan :    Complicated Alcohol  Withdrawal  Alcohol  Withdrawal Seizures Strictly counseled to quit alcohol , this is a recurrent problem, on Librium  and CIWA protocol, DTs getting worse will add phenobarb and monitor.   Traumatic Fall in the Setting of Suspected Etoh withdrawal seizure Left Periorbial Laceration (s/p repair with tissue adhesive) CT head and maxillofacial no acute changes, PT eval, supportive care.   Abdominal Pain  Nausea  Vomiting Much improved  after supportive care continue PPI and antiemetics, stable lipase   Alcoholic Hepatitis.  Fatty liver versus alcoholic cirrhosis.  Portal Hypertensive Gastropathy  Ascites  Chronic thrombocytopenia Maddrey discriminant function low, no need for steroids, ultrasound stable, continue to monitor with supportive care.  Resume Aldactone  upon discharge was dehydrated upon admission.  Outpatient [place] [facility] follow-up postdischarge.  Hyponatremia Due to dehydration resolved after IV fluids  Hypokalemia,  hypomagnesemia, hypophosphatemia Replaced   Seizure Disorder Continue keppra  Breakthrough seizure in setting of etoh withdrawal above   Gout Allopurinol    Chronic Pain Gabapentin    Mood Disorder Schizoaffective Disorder Continue nightly seroquel  and zoloft   GERD.  On PPI.        Condition - Fair  Family Communication  : None present  Code Status :  Full  Consults  :   None  PUD Prophylaxis :  PPI   Procedures  :     CT head, abdominal ultrasound.  Nonacute.      Disposition Plan  :    Status is: Inpatient   DVT Prophylaxis  :    SCDs Start: 01/08/24 2002    Lab Results  Component Value Date   PLT 84 (L) 01/10/2024    Diet :  Diet Order             DIET SOFT Fluid consistency: Thin  Diet effective now                    Inpatient Medications  Scheduled Meds:  allopurinol   100 mg Oral Daily   chlordiazePOXIDE   20 mg Oral TID   fentaNYL        folic acid   1 mg Oral Daily   gabapentin   300 mg Oral BID   lactulose   10 g Oral TID   levETIRAcetam   500 mg Oral BID   LORazepam   0-4 mg Oral Q12H   multivitamin with minerals  1 tablet Oral Daily   pantoprazole   40 mg Oral Daily   PHENObarbital  130 mg Intravenous TID   potassium chloride   40 mEq Oral Once   QUEtiapine   200 mg Oral QHS   sertraline   25 mg Oral Daily   [START ON 01/11/2024] thiamine   100 mg Oral Daily   Continuous Infusions:  lactated ringers      magnesium  sulfate bolus IVPB      potassium PHOSPHATE  IVPB (in mmol)     thiamine  (VITAMIN B1) injection 500 mg (01/09/24 1120)   PRN Meds:.fentaNYL , LORazepam  **OR** LORazepam , ondansetron  **OR** ondansetron  (ZOFRAN ) IV, polyethylene glycol  Antibiotics  :    Anti-infectives (From admission, onward)    None         Objective:   Vitals:   01/10/24 0308 01/10/24 0355 01/10/24 0401 01/10/24 0724  BP: 128/80  126/71 107/82  Pulse: 91 (!) 106 91 82  Resp: (!) 24   19  Temp: 98.2 F (36.8 C)   98.1 F (36.7 C)  TempSrc: Oral   Oral  SpO2:    (!) 89%  Weight:      Height:        Wt Readings from Last 3 Encounters:  01/08/24 94.3 kg  12/01/23 101.7 kg  11/11/23 99.8 kg     Intake/Output Summary (Last 24 hours) at 01/10/2024 1009 Last data filed at 01/09/2024 2230 Gross per 24 hour  Intake 420 ml  Output 300 ml  Net 120 ml     Physical Exam  Awake   No new F.N deficits, in DTs ++ L periorbital hematoma, mild Supple Neck, No JVD,   Symmetrical Chest wall movement, Good air movement bilaterally, CTAB RRR,No Gallops,Rubs or new Murmurs,  +ve B.Sounds, Abd Soft, No tenderness,   No Cyanosis, Clubbing or edema       Data Review:    Recent Labs  Lab 01/08/24 0245 01/08/24 0845 01/09/24 0601 01/10/24 0418  WBC 7.0 5.8 3.7* 5.1  HGB  14.7 14.4 13.1 12.5*  HCT 41.6 41.4 38.5* 36.8*  PLT 64* 59* 66* 84*  MCV 95.4 97.0 98.7 98.7  MCH 33.7 33.7 33.6 33.5  MCHC 35.3 34.8 34.0 34.0  RDW 14.7 14.8 15.1 15.2  LYMPHSABS 0.9 1.4  --  1.6  MONOABS 0.8 0.7  --  0.7  EOSABS 0.0 0.0  --  0.0  BASOSABS 0.0 0.0  --  0.0    Recent Labs  Lab 01/08/24 0245 01/08/24 0845 01/09/24 0558 01/09/24 0601 01/10/24 0418  NA 125* 130*  --  135 133*  K 2.1* 3.1*  --  3.3* 3.4*  CL 79* 88*  --  97* 101  CO2 28 29  --  27 23  ANIONGAP 18* 13  --  11 9  GLUCOSE 139* 106*  --  88 105*  BUN 13 9  --  13 12  CREATININE 1.28* 1.10  --  0.86 0.99  AST 69* 61*  --  95* 164*  ALT 31 29  --  38 70*  ALKPHOS  93 84  --  78 83  BILITOT 4.6* 3.9*  --  3.6* 2.7*  ALBUMIN  3.6 3.4*  --  3.1* 3.1*  INR  --  1.1  --   --   --   AMMONIA  --   --   --  45* 63*  BNP  --   --   --  54.3  --   MG 0.8* 2.7* 2.1  --  1.4*  PHOS  --   --  3.0  --  2.1*  CALCIUM  8.3* 8.0*  --  8.5* 8.8*      Recent Labs  Lab 01/08/24 0245 01/08/24 0845 01/09/24 0558 01/09/24 0601 01/10/24 0418  INR  --  1.1  --   --   --   AMMONIA  --   --   --  45* 63*  BNP  --   --   --  54.3  --   MG 0.8* 2.7* 2.1  --  1.4*  CALCIUM  8.3* 8.0*  --  8.5* 8.8*    --------------------------------------------------------------------------------------------------------------- Lab Results  Component Value Date   CHOL 159 03/02/2019   HDL 53 03/02/2019   LDLCALC 84 03/02/2019   TRIG 164 (H) 07/21/2020   CHOLHDL 3.0 03/02/2019    Lab Results  Component Value Date   HGBA1C 5.5 09/07/2021      Radiology Report US  ASCITES (ABDOMEN LIMITED) Result Date: 01/08/2024 CLINICAL DATA:  Abdominal pain EXAM: LIMITED ABDOMEN ULTRASOUND FOR ASCITES TECHNIQUE: Limited ultrasound survey for ascites was performed in all four abdominal quadrants. COMPARISON:  None Available. FINDINGS: No significant ascites in the 4 quadrants of the abdomen by ultrasound. IMPRESSION: No significant ascites seen at this time. Electronically Signed   By: Adrianna Horde M.D.   On: 01/08/2024 13:53   CT MAXILLOFACIAL WO CONTRAST Result Date: 01/08/2024 CLINICAL DATA:  Facial trauma EXAM: CT MAXILLOFACIAL WITHOUT CONTRAST TECHNIQUE: Multidetector CT imaging of the maxillofacial structures was performed. Multiplanar CT image reconstructions were also generated. RADIATION DOSE REDUCTION: This exam was performed according to the departmental dose-optimization program which includes automated exposure control, adjustment of the mA and/or kV according to patient size and/or use of iterative reconstruction technique. COMPARISON:  Same-day head CT. FINDINGS: Osseous: Maxilla:  Intact.  Periapical lucencies of left maxillary molars. Pterygoid Plates: Intact Zygomatic Arch: Intact Orbits: Intact Ethmoid: Intact Sphenoid: Intact Frontal:Intact Mandible: Intact. No condylar dislocation. Periapical lucencies of left mandibular molars. Dental  caries. Nasal: Intact Nasal Septum: Intact. Rightward bowing of the nasal septum without mass effect on the turbinates. Orbits: The globes are intact. Lenses are normally located. Normal appearance of the extraocular muscles and optic nerve sheath complexes. Intraorbital fat is unremarkable. There is a small focus of soft tissue swelling along the lateral aspect of the left orbit with partial extension into the superolateral preseptal soft tissues. Sinuses: Mucosal thickening in the alveolar recesses of both maxillary sinuses which may be odontogenic in origin. Additional small mucous retention cyst in the right maxillary sinus. No air-fluid levels. Soft tissues: Left periorbital soft tissue swelling. Limited intracranial: No significant or unexpected finding. IMPRESSION: No acute maxillofacial fracture. Left periorbital soft tissue swelling as above. Dental caries.  Multiple periapical lucencies. Electronically Signed   By: Denny Flack M.D.   On: 01/08/2024 13:06   DG Shoulder Left Result Date: 01/08/2024 CLINICAL DATA:  [id number] Abdominal pain [id number].  Seizures. EXAM: LEFT SHOULDER - 2+ VIEW COMPARISON:  None Available. FINDINGS: No acute fracture. No aggressive osseous lesion. Glenohumeral and acromioclavicular joints alignment is within normal limits. No significant degenerative changes. No soft tissue swelling. No radiopaque foreign bodies. IMPRESSION: No acute osseous abnormality of the left shoulder. Electronically Signed   By: Beula Brunswick M.D.   On: 01/08/2024 10:52   DG CHEST PORT 1 VIEW Result Date: 01/08/2024 CLINICAL DATA:  [id number] Abdominal pain [id number] EXAM: PORTABLE CHEST 1 VIEW COMPARISON:  11/12/2023. FINDINGS: Low lung volume.  Bilateral lung fields are clear. Bilateral costophrenic angles are clear. Stable cardio-mediastinal silhouette. No acute osseous abnormalities. The soft tissues are within normal limits. No free air under the domes of diaphragm. IMPRESSION: No active disease. Electronically Signed   By: Beula Brunswick M.D.   On: 01/08/2024 10:51   US  Abdomen Limited RUQ (LIVER/GB) Result Date: 01/08/2024 CLINICAL DATA:  [id number] Abdominal pain [id number]. EXAM: ULTRASOUND ABDOMEN LIMITED RIGHT UPPER QUADRANT COMPARISON:  None Available. FINDINGS: Gallbladder: Physiologically distended. Small-to-moderate volume sludge noted. No gallstones. No abnormal wall thickening or pericholecystic free fluid. Sonographic Murphy sign was negative as per the technologist. Common bile duct: Diameter: Up to 3.7 mm.  No intrahepatic bile duct dilation. Liver: There is poor sound beam penetration to the deep / posterior aspects of the liver as a result of increased hepatic echogenicity which reduces the sensitivity of ultrasound for the detection of focal masses. That being said, no focal mass is identified. Portal vein is patent on color Doppler imaging with normal direction of blood flow towards the liver. Other: None. IMPRESSION: *Small-to-moderate volume gallbladder sludge without sonographic evidence of acute cholecystitis. *Increased hepatic echogenicity, a nonspecific finding that is most commonly seen on the basis of steatosis in the absence of known liver disease. Electronically Signed   By: Beula Brunswick M.D.   On: 01/08/2024 10:30     Signature  -   Lynnwood Sauer M.D on 01/10/2024 at 10:09 AM   -  To page go to [email or web address]

## 2024-01-11 DIAGNOSIS — R569 Unspecified convulsions: Secondary | ICD-10-CM | POA: Diagnosis not present

## 2024-01-11 LAB — COMPREHENSIVE METABOLIC PANEL WITH GFR
ALT: 70 U/L — ABNORMAL HIGH (ref 0–44)
AST: 127 U/L — ABNORMAL HIGH (ref 15–41)
Albumin: 2.9 g/dL — ABNORMAL LOW (ref 3.5–5.0)
Alkaline Phosphatase: 80 U/L (ref 38–126)
Anion gap: 7 (ref 5–15)
BUN: 11 mg/dL (ref 6–20)
CO2: 21 mmol/L — ABNORMAL LOW (ref 22–32)
Calcium: 8.7 mg/dL — ABNORMAL LOW (ref 8.9–10.3)
Chloride: 107 mmol/L (ref 98–111)
Creatinine, Ser: 0.9 mg/dL (ref 0.61–1.24)
GFR, Estimated: >60 mL/min (ref >60)
Glucose, Bld: 99 mg/dL (ref 70–99)
Potassium: 4 mmol/L (ref 3.5–5.1)
Sodium: 135 mmol/L (ref 135–145)
Total Bilirubin: 2.4 mg/dL — ABNORMAL HIGH (ref 0.0–1.2)
Total Protein: 5.6 g/dL — ABNORMAL LOW (ref 6.5–8.1)

## 2024-01-11 LAB — AMMONIA: Ammonia: 35 umol/L (ref 9–35)

## 2024-01-11 LAB — CBC WITH DIFFERENTIAL/PLATELET
Abs Immature Granulocytes: 0.01 10*3/uL (ref 0.00–0.07)
Basophils Absolute: 0 10*3/uL (ref 0.0–0.1)
Basophils Relative: 1 %
Eosinophils Absolute: 0.1 10*3/uL (ref 0.0–0.5)
Eosinophils Relative: 2 %
HCT: 36.1 % — ABNORMAL LOW (ref 39.0–52.0)
Hemoglobin: 12.1 g/dL — ABNORMAL LOW (ref 13.0–17.0)
Immature Granulocytes: 0 %
Lymphocytes Relative: 31 %
Lymphs Abs: 1.5 10*3/uL (ref 0.7–4.0)
MCH: 33.4 pg (ref 26.0–34.0)
MCHC: 33.5 g/dL (ref 30.0–36.0)
MCV: 99.7 fL (ref 80.0–100.0)
Monocytes Absolute: 0.7 10*3/uL (ref 0.1–1.0)
Monocytes Relative: 14 %
Neutro Abs: 2.5 10*3/uL (ref 1.7–7.7)
Neutrophils Relative %: 52 %
Platelets: 96 10*3/uL — ABNORMAL LOW (ref 150–400)
RBC: 3.62 MIL/uL — ABNORMAL LOW (ref 4.22–5.81)
RDW: 15.7 % — ABNORMAL HIGH (ref 11.5–15.5)
WBC: 4.8 10*3/uL (ref 4.0–10.5)
nRBC: 0 % (ref 0.0–0.2)

## 2024-01-11 LAB — MAGNESIUM: Magnesium: 1.8 mg/dL (ref 1.7–2.4)

## 2024-01-11 LAB — PHOSPHORUS: Phosphorus: 5.2 mg/dL — ABNORMAL HIGH (ref 2.5–4.6)

## 2024-01-11 LAB — LEVETIRACETAM LEVEL: Levetiracetam Lvl: 44.1 ug/mL — ABNORMAL HIGH (ref 10.0–40.0)

## 2024-01-11 MED ORDER — CHLORDIAZEPOXIDE HCL 5 MG PO CAPS
15.0000 mg | ORAL_CAPSULE | Freq: Three times a day (TID) | ORAL | Status: DC
  Administered 2024-01-11 (×3): 15 mg via ORAL
  Filled 2024-01-11 (×3): qty 3

## 2024-01-11 MED ORDER — LACTATED RINGERS IV SOLN: INTRAVENOUS | Status: AC

## 2024-01-11 MED ORDER — MAGNESIUM SULFATE 2 GM/50ML IV SOLN
2.0000 g | Freq: Once | INTRAVENOUS | Status: AC
  Administered 2024-01-11: 2 g via INTRAVENOUS
  Filled 2024-01-11: qty 50

## 2024-01-11 MED ORDER — PHENOBARBITAL SODIUM 65 MG/ML IJ SOLN
65.0000 mg | Freq: Three times a day (TID) | INTRAMUSCULAR | Status: DC
  Administered 2024-01-11 (×3): 65 mg via INTRAVENOUS
  Filled 2024-01-11 (×3): qty 1

## 2024-01-11 NOTE — Progress Notes (Signed)
 PROGRESS NOTE                                                                                                                                                                                                             Patient Demographics:    [name], is a 53 y.o. male, DOB - 1970-11-10, JYN:[id number]  Outpatient Primary MD for the patient is Abraham Abo, MD    LOS - 3  Admit date - 01/08/2024    Chief Complaint  Patient presents with   Seizures       Brief Narrative (HPI from H&P)    53 y.o. male with medical history significant of etoh abuse, cirrhosis (portal HTN, esophageal varices, ascites), schizoaffective disorder, seizure disorder and other medical issues here after suspected seizure. He notes that he stopped drinking and a few days later "I think I had a seizure". "I stopped because I have cirrhosis, I can't be doing that." He notes that he was "on a bender" this month, drinking 5 twenty four oz 4 lokos a day. On further discussion, sounds like he had nausea/vomiting and abdominal discomfort that also contributed to his decision to stop drinking. He doesn't remember events around suspected seizure, but he woke up disoriented in a doorway. There was blood everywhere from his L eye laceration.    Subjective:   Patient in bed, appears comfortable, denies any headache, no fever, no chest pain or pressure, no shortness of breath , no abdominal pain. No focal weakness.   Assessment  & Plan :    Complicated Alcohol  Withdrawal  Alcohol  Withdrawal Seizures Strictly counseled to quit alcohol , this is a recurrent problem, on Librium  and CIWA protocol, along with phenobarb and monitor.   Traumatic Fall in the Setting of Suspected Etoh withdrawal seizure Left Periorbial Laceration (s/p repair with tissue adhesive) CT head and maxillofacial no acute changes, PT eval, supportive care.   Abdominal Pain  Nausea   Vomiting Much improved after supportive care continue PPI and antiemetics, stable lipase   Alcoholic Hepatitis.  Fatty liver versus alcoholic cirrhosis.  Portal Hypertensive Gastropathy  Ascites  Chronic thrombocytopenia Maddrey discriminant function low, no need for steroids, ultrasound stable, continue to monitor with supportive care.  Resume Aldactone  upon discharge was dehydrated upon admission.  Outpatient [place] [facility] follow-up postdischarge.  Hypokalemia, hypomagnesemia, hypophosphatemia Replaced  Seizure Disorder Continue keppra  Breakthrough seizure in setting of etoh withdrawal above   Gout Allopurinol    Chronic Pain Gabapentin    Mood Disorder Schizoaffective Disorder Continue nightly seroquel  and zoloft   GERD.  On PPI.        Condition - Fair  Family Communication  : None present  Code Status :  Full  Consults  :   None  PUD Prophylaxis :  PPI   Procedures  :     CT head, abdominal ultrasound.  Nonacute.      Disposition Plan  :    Status is: Inpatient   DVT Prophylaxis  :    SCDs Start: 01/08/24 2002    Lab Results  Component Value Date   PLT 96 (L) 01/11/2024    Diet :  Diet Order             DIET SOFT Fluid consistency: Thin  Diet effective now                    Inpatient Medications  Scheduled Meds:  allopurinol   100 mg Oral Daily   chlordiazePOXIDE   15 mg Oral TID   folic acid   1 mg Oral Daily   gabapentin   300 mg Oral BID   lactulose   10 g Oral TID   levETIRAcetam   500 mg Oral BID   multivitamin with minerals  1 tablet Oral Daily   nicotine   21 mg Transdermal Daily   pantoprazole   40 mg Oral Daily   PHENObarbital  65 mg Intravenous TID   QUEtiapine   200 mg Oral QHS   sertraline   25 mg Oral Daily   thiamine   100 mg Oral Daily   Continuous Infusions:  lactated ringers      PRN Meds:.LORazepam  **OR** LORazepam , ondansetron  **OR** ondansetron  (ZOFRAN ) IV, polyethylene glycol  Antibiotics  :     Anti-infectives (From admission, onward)    None         Objective:   Vitals:   01/10/24 2100 01/10/24 2200 01/10/24 2300 01/11/24 0315  BP: 122/78 111/78 102/65 116/68  Pulse: 88 85 89 79  Resp: 20  (!) 24 17  Temp:   97.6 F (36.4 C) 97.9 F (36.6 C)  TempSrc:   Oral Oral  SpO2: 95%  93% 91%  Weight:      Height:        Wt Readings from Last 3 Encounters:  01/08/24 94.3 kg  12/01/23 101.7 kg  11/11/23 99.8 kg     Intake/Output Summary (Last 24 hours) at 01/11/2024 0824 Last data filed at 01/10/2024 2100 Gross per 24 hour  Intake 983.61 ml  Output 300 ml  Net 683.61 ml     Physical Exam  Awake   No new F.N deficits, improved DTs, L periorbital hematoma, mild Supple Neck, No JVD,   Symmetrical Chest wall movement, Good air movement bilaterally, CTAB RRR,No Gallops,Rubs or new Murmurs,  +ve B.Sounds, Abd Soft, No tenderness,   No Cyanosis, Clubbing or edema       Data Review:    Recent Labs  Lab 01/08/24 0245 01/08/24 0845 01/09/24 0601 01/10/24 0418 01/11/24 0443  WBC 7.0 5.8 3.7* 5.1 4.8  HGB 14.7 14.4 13.1 12.5* 12.1*  HCT 41.6 41.4 38.5* 36.8* 36.1*  PLT 64* 59* 66* 84* 96*  MCV 95.4 97.0 98.7 98.7 99.7  MCH 33.7 33.7 33.6 33.5 33.4  MCHC 35.3 34.8 34.0 34.0 33.5  RDW 14.7 14.8 15.1 15.2 15.7*  LYMPHSABS 0.9 1.4  --  1.6 1.5  MONOABS 0.8 0.7  --  0.7 0.7  EOSABS 0.0 0.0  --  0.0 0.1  BASOSABS 0.0 0.0  --  0.0 0.0    Recent Labs  Lab 01/08/24 0245 01/08/24 0845 01/09/24 0558 01/09/24 0601 01/10/24 0418 01/11/24 0443  NA 125* 130*  --  135 133* 135  K 2.1* 3.1*  --  3.3* 3.4* 4.0  CL 79* 88*  --  97* 101 107  CO2 28 29  --  27 23 21*  ANIONGAP 18* 13  --  11 9 7   GLUCOSE 139* 106*  --  88 105* 99  BUN 13 9  --  13 12 11   CREATININE 1.28* 1.10  --  0.86 0.99 0.90  AST 69* 61*  --  95* 164* 127*  ALT 31 29  --  38 70* 70*  ALKPHOS 93 84  --  78 83 80  BILITOT 4.6* 3.9*  --  3.6* 2.7* 2.4*  ALBUMIN  3.6 3.4*  --  3.1* 3.1*  2.9*  INR  --  1.1  --   --   --   --   AMMONIA  --   --   --  45* 63* 35  BNP  --   --   --  54.3  --   --   MG 0.8* 2.7* 2.1  --  1.4* 1.8  PHOS  --   --  3.0  --  2.1* 5.2*  CALCIUM  8.3* 8.0*  --  8.5* 8.8* 8.7*      Recent Labs  Lab 01/08/24 0245 01/08/24 0845 01/09/24 0558 01/09/24 0601 01/10/24 0418 01/11/24 0443  INR  --  1.1  --   --   --   --   AMMONIA  --   --   --  45* 63* 35  BNP  --   --   --  54.3  --   --   MG 0.8* 2.7* 2.1  --  1.4* 1.8  CALCIUM  8.3* 8.0*  --  8.5* 8.8* 8.7*    --------------------------------------------------------------------------------------------------------------- Lab Results  Component Value Date   CHOL 159 03/02/2019   HDL 53 03/02/2019   LDLCALC 84 03/02/2019   TRIG 164 (H) 07/21/2020   CHOLHDL 3.0 03/02/2019    Lab Results  Component Value Date   HGBA1C 5.5 09/07/2021      Radiology Report No results found.    Signature  -   Lynnwood Sauer M.D on 01/11/2024 at 8:24 AM   -  To page go to [email or web address]

## 2024-01-11 NOTE — Progress Notes (Signed)
 Physical Therapy Treatment Patient Details Name: [name] MRN: [id number] DOB: 04-17-1971 Today's Date: 01/11/2024   History of Present Illness 53 y.o. male Admitted 5/23 with Alcohol  Withdrawal Seizures. Traumatic Fall in the Setting of Suspected Etoh withdrawal seizure, Left Periorbial Laceration (s/p repair with tissue adhesive). with medical history significant of etoh abuse, cirrhosis (portal HTN, esophageal varices, ascites), schizoaffective disorder, seizure disorder.    PT Comments  Pt making gradual progress.  He was able to increase gait distance and performed multiple transfers.  Does still have some instability that benefited from use of RW.  Cont POC.  Recommend outpt PT.     If plan is discharge home, recommend the following: A little help with walking and/or transfers;Assist for transportation;Help with stairs or ramp for entrance;A little help with bathing/dressing/bathroom;Assistance with cooking/housework   Can travel by private vehicle        Equipment Recommendations  Rolling walker (2 wheels)    Recommendations for Other Services       Precautions / Restrictions Precautions Precautions: Fall     Mobility  Bed Mobility Overal bed mobility: Needs Assistance Bed Mobility: Supine to Sit, Sit to Supine     Supine to sit: Supervision Sit to supine: Supervision        Transfers Overall transfer level: Needs assistance Equipment used: Rolling walker (2 wheels), None Transfers: Sit to/from Stand Sit to Stand: Supervision, Contact guard assist           General transfer comment: STS x 4 with RW and close supervision; STS x 1 without RW requiring CGA; pt performed his own toileting ADLs    Ambulation/Gait Ambulation/Gait assistance: Contact guard assist Gait Distance (Feet): 200 Feet Assistive device: Rolling walker (2 wheels) Gait Pattern/deviations: Step-to pattern, Wide base of support, Shuffle Gait velocity: decr     General Gait Details:  increased lateral weight shifting with small steps; needing RW for stability; ambulated 200' in hallway, return to bed then had to go to bathroom so 10'x2 with RW and CGA   Stairs             Wheelchair Mobility     Tilt Bed    Modified Rankin (Stroke Patients Only)       Balance Overall balance assessment: Needs assistance Sitting-balance support: No upper extremity supported, Feet supported Sitting balance-Leahy Scale: Good Sitting balance - Comments: donned socks at EOB   Standing balance support: Bilateral upper extremity supported, No upper extremity supported Standing balance-Leahy Scale: Fair Standing balance comment: Able to static stand without UE support to wash hands but needs RW to ambulate               High Level Balance Comments: look up/down/L/R , marching, turning, and navigating around objects with RW and CGA            Communication    Cognition Arousal: Alert Behavior During Therapy: Flat affect   PT - Cognitive impairments: No apparent impairments                       PT - Cognition Comments: A&Ox4, improved safety awareness - careful with lines, asking for RW        Cueing    Exercises      General Comments General comments (skin integrity, edema, etc.): VSS      Pertinent Vitals/Pain Pain Assessment Pain Assessment: Faces Faces Pain Scale: Hurts a little bit Pain Location: bil feet Pain Descriptors / Indicators: Discomfort Pain  Intervention(s): Limited activity within patient's tolerance, Monitored during session    Home Living                          Prior Function            PT Goals (current goals can now be found in the care plan section) Acute Rehab PT Goals Patient Stated Goal: return home PT Goal Formulation: With patient Progress towards PT goals: Progressing toward goals    Frequency    Min 2X/week      PT Plan      Co-evaluation              AM-PAC PT "6 Clicks"  Mobility   Outcome Measure  Help needed turning from your back to your side while in a flat bed without using bedrails?: None Help needed moving from lying on your back to sitting on the side of a flat bed without using bedrails?: None Help needed moving to and from a bed to a chair (including a wheelchair)?: A Little Help needed standing up from a chair using your arms (e.g., wheelchair or bedside chair)?: A Little Help needed to walk in hospital room?: A Little Help needed climbing 3-5 steps with a railing? : A Little 6 Click Score: 20    End of Session Equipment Utilized During Treatment: Gait belt Activity Tolerance: Patient tolerated treatment well Patient left: with bed alarm set;in bed;with call bell/phone within reach   PT Visit Diagnosis: Other abnormalities of gait and mobility (R26.89);Repeated falls (R29.6);Muscle weakness (generalized) (M62.81);History of falling (Z91.81);Other symptoms and signs involving the nervous system (R29.898);Pain Pain - Right/Left: Left Pain - part of body: Shoulder     Time: 1513-1550 PT Time Calculation (min) (ACUTE ONLY): 37 min  Charges:    $Gait Training: 8-22 mins $Therapeutic Activity: 8-22 mins PT General Charges $$ ACUTE PT VISIT: 1 Visit                     Cyd Dowse, PT Acute Rehab [facility] [phone]    Carolynn Citrin 01/11/2024, 3:54 PM

## 2024-01-11 NOTE — TOC Initial Note (Signed)
 Transition of Care [facility]) - Initial/Assessment Note    Patient Details  Name: [name] MRN: [id number] Date of Birth: April 19, 1971  Transition of [facility]) CM/SW Contact:    Cosimo Diones, RN Phone Number: 01/11/2024, 12:29 PM  Clinical Narrative:  Patient presented for suspected seizure. PTA patient was from home alone. Patient states he has support of sister Carles Cheadle. Patient has DME single point cane. Case Manager discussed outpatient PT and the patient is agreeable to an ambulatory referral to the [facility]. The office will call the patient to schedule a visit. Patient states he has transportation; however, needs to get tags for his car. Patient states he can take the bus to outpatient PT if needed.. No further home needs identified.                   Expected Discharge Plan: OP Rehab Barriers to Discharge: No Barriers Identified   Patient Goals and CMS Choice Patient states their goals for this hospitalization and ongoing recovery are:: patient wants to feel better and return home.  Expected Discharge Plan and Services   Discharge Planning Services: CM Consult   Living arrangements for the past 2 months: Single Family Home                   DME Agency: NA       HH Arranged: NA  Prior Living Arrangements/Services Living arrangements for the past 2 months: Single Family Home Lives with:: Self Patient language and need for interpreter reviewed:: Yes Do you feel safe going back to the place where you live?: Yes      Need for Family Participation in Patient Care: No (Comment) Care giver support system in place?: No (comment) Current home services: DME (single point cane) Criminal Activity/Legal Involvement Pertinent to Current Situation/Hospitalization: No - Comment as needed  Activities of Daily Living   ADL Screening (condition at time of admission) Independently performs ADLs?: Yes (appropriate for developmental age) Is the patient deaf or  have difficulty hearing?: No Does the patient have difficulty seeing, even when wearing glasses/contacts?: Yes Does the patient have difficulty concentrating, remembering, or making decisions?: Yes  Permission Sought/Granted Permission sought to share information with : Family Supports, Case Production designer, theatre/television/film, Photographer granted to share info w AGENCY: [facility]   Emotional Assessment Appearance:: Appears stated age Attitude/Demeanor/Rapport: Engaged Affect (typically observed): Appropriate Orientation: : Oriented to Self, Oriented to Place, Oriented to  Time, Oriented to Situation Alcohol  / Substance Use: Not Applicable Psych Involvement: No (comment)  Admission diagnosis:  Hypokalemia [E87.6] Hypomagnesemia [E83.42] Hyponatremia [E87.1] Seizure (HCC) [R56.9] Thrombocytopenia (HCC) [D69.6] Serum total bilirubin elevated [R17] Acute kidney injury (nontraumatic) (HCC) [N17.9] Elevated AST (SGOT) [R74.01] Elevated random blood glucose level [R73.9] Forehead laceration, initial encounter [S01.81XA] Alcohol  withdrawal seizure without complication (HCC) [F10.930, R56.9] Alcohol  withdrawal syndrome without complication (HCC) [F10.930] Patient Active Problem List   Diagnosis Date Noted   Seizure (HCC) 01/08/2024   Hypervolemic hyponatremia 03/05/2023   Hypocalcemia 03/05/2023   Hepatic cirrhosis (HCC) 03/05/2023   History of seizure 03/05/2023   Leukocytosis 03/05/2023   Chronic anemia 03/05/2023   Schizoaffective disorder (HCC) 03/05/2023   Transaminitis 03/05/2023   Elevated bilirubin 03/05/2023   Hypochloremia 03/05/2023   Alcohol  withdrawal (HCC) 02/02/2022   Hyperammonemia (HCC) 02/02/2022   Alcohol  withdrawal syndrome without complication (HCC) 01/10/2022   Acute gout 01/10/2022   Elevated liver function tests    Nausea  and vomiting    Trigger finger, right middle finger 05/20/2021   Alcoholic cirrhosis of liver with  ascites (HCC) 03/08/2021   Polysubstance abuse (HCC) 03/08/2021   Secondary esophageal varices without bleeding (HCC) 03/08/2021   Macrocytic anemia    Hepatic steatosis    Hypomagnesemia 05/03/2020   Hyperbilirubinemia 05/03/2020   Alcoholism (HCC) 05/03/2020   Alcoholic hepatitis without ascites 03/06/2019   Essential hypertension 01/10/2019   Alcohol  intoxication (HCC) 01/10/2019   Schizoaffective disorder, bipolar type (HCC) 06/02/2018   Alcohol -induced mood disorder (HCC) 02/22/2016   Gout 10/22/2015   GERD (gastroesophageal reflux disease) 10/22/2015   Schizoaffective disorder, depressive type (HCC)    History of alcohol  abuse    Psychosis (HCC) 05/13/2015   Tobacco dependence due to cigarettes 11/20/2014   Abnormal LFTs    Alcohol  abuse 02/09/2014   PTSD (post-traumatic stress disorder) 11/03/2011   Hypokalemia 07/02/2011   PCP:  Abraham Abo, MD Pharmacy:   [facility], [place] - [address] [place] [address] Phone: [phone] Fax: [phone]  #[address] [facility] [address] DISCOUNT - [place], [place] - [address] [place] [address] Phone: [phone] Fax: [phone]  Social Drivers of Health (SDOH) Social History: SDOH Screenings   Food Insecurity: Food Insecurity Present (01/08/2024)  Housing: High Risk (01/08/2024)  Transportation Needs: Unmet Transportation Needs (01/08/2024)  Utilities: At Risk (01/08/2024)  Alcohol  Screen: High Risk (12/01/2023)  Depression (PHQ2-9): Medium Risk (12/01/2023)  Financial Resource Strain: High Risk (12/01/2023)  Physical Activity: Insufficiently Active (12/01/2023)  Social Connections: Moderately Integrated (12/01/2023)  Stress: Stress Concern Present (12/01/2023)  Tobacco Use: High Risk (01/08/2024)  Health Literacy: Adequate Health Literacy (12/01/2023)   Readmission Risk Interventions    09/19/2021   12:21 PM  Readmission Risk  Prevention Plan  HRI or Home Care Consult Complete  Social Work Consult for Recovery Care Planning/Counseling Complete  Palliative Care Screening Not Applicable

## 2024-01-11 NOTE — Plan of Care (Signed)

## 2024-01-12 DIAGNOSIS — R569 Unspecified convulsions: Secondary | ICD-10-CM | POA: Diagnosis not present

## 2024-01-12 LAB — CBC WITH DIFFERENTIAL/PLATELET
Abs Immature Granulocytes: 0.02 10*3/uL (ref 0.00–0.07)
Basophils Absolute: 0 10*3/uL (ref 0.0–0.1)
Basophils Relative: 1 %
Eosinophils Absolute: 0.1 10*3/uL (ref 0.0–0.5)
Eosinophils Relative: 2 %
HCT: 36.1 % — ABNORMAL LOW (ref 39.0–52.0)
Hemoglobin: 12.1 g/dL — ABNORMAL LOW (ref 13.0–17.0)
Immature Granulocytes: 0 %
Lymphocytes Relative: 35 %
Lymphs Abs: 1.6 10*3/uL (ref 0.7–4.0)
MCH: 33.4 pg (ref 26.0–34.0)
MCHC: 33.5 g/dL (ref 30.0–36.0)
MCV: 99.7 fL (ref 80.0–100.0)
Monocytes Absolute: 0.6 10*3/uL (ref 0.1–1.0)
Monocytes Relative: 12 %
Neutro Abs: 2.3 10*3/uL (ref 1.7–7.7)
Neutrophils Relative %: 50 %
Platelets: 139 10*3/uL — ABNORMAL LOW (ref 150–400)
RBC: 3.62 MIL/uL — ABNORMAL LOW (ref 4.22–5.81)
RDW: 15.9 % — ABNORMAL HIGH (ref 11.5–15.5)
WBC: 4.6 10*3/uL (ref 4.0–10.5)
nRBC: 0 % (ref 0.0–0.2)

## 2024-01-12 LAB — COMPREHENSIVE METABOLIC PANEL WITH GFR
ALT: 69 U/L — ABNORMAL HIGH (ref 0–44)
AST: 105 U/L — ABNORMAL HIGH (ref 15–41)
Albumin: 2.9 g/dL — ABNORMAL LOW (ref 3.5–5.0)
Alkaline Phosphatase: 89 U/L (ref 38–126)
Anion gap: 5 (ref 5–15)
BUN: 14 mg/dL (ref 6–20)
CO2: 21 mmol/L — ABNORMAL LOW (ref 22–32)
Calcium: 8.9 mg/dL (ref 8.9–10.3)
Chloride: 107 mmol/L (ref 98–111)
Creatinine, Ser: 0.92 mg/dL (ref 0.61–1.24)
GFR, Estimated: >60 mL/min (ref >60)
Glucose, Bld: 104 mg/dL — ABNORMAL HIGH (ref 70–99)
Potassium: 4 mmol/L (ref 3.5–5.1)
Sodium: 133 mmol/L — ABNORMAL LOW (ref 135–145)
Total Bilirubin: 1.9 mg/dL — ABNORMAL HIGH (ref 0.0–1.2)
Total Protein: 5.7 g/dL — ABNORMAL LOW (ref 6.5–8.1)

## 2024-01-12 LAB — MAGNESIUM: Magnesium: 1.6 mg/dL — ABNORMAL LOW (ref 1.7–2.4)

## 2024-01-12 LAB — PHOSPHORUS: Phosphorus: 4.3 mg/dL (ref 2.5–4.6)

## 2024-01-12 LAB — AMMONIA: Ammonia: 40 umol/L — ABNORMAL HIGH (ref 9–35)

## 2024-01-12 MED ORDER — MAGNESIUM SULFATE 4 GM/100ML IV SOLN
4.0000 g | Freq: Once | INTRAVENOUS | Status: AC
  Administered 2024-01-12: 4 g via INTRAVENOUS
  Filled 2024-01-12: qty 100

## 2024-01-12 MED ORDER — MAGNESIUM SULFATE 4 GM/100ML IV SOLN: 4.0000 g | Freq: Once | INTRAVENOUS | Status: DC

## 2024-01-12 MED ORDER — ACETAMINOPHEN 325 MG PO TABS: 650.0000 mg | ORAL_TABLET | Freq: Four times a day (QID) | ORAL | Status: DC | PRN

## 2024-01-12 MED ORDER — LACTULOSE 10 GM/15ML PO SOLN
30.0000 g | Freq: Two times a day (BID) | ORAL | Status: DC
  Administered 2024-01-12 – 2024-01-15 (×6): 30 g via ORAL
  Filled 2024-01-12 (×6): qty 60

## 2024-01-12 MED ORDER — LACTATED RINGERS IV BOLUS
1000.0000 mL | Freq: Once | INTRAVENOUS | Status: AC
  Administered 2024-01-12: 1000 mL via INTRAVENOUS

## 2024-01-12 MED ORDER — PHENOBARBITAL SODIUM 65 MG/ML IJ SOLN
65.0000 mg | Freq: Two times a day (BID) | INTRAMUSCULAR | Status: AC
  Administered 2024-01-12 – 2024-01-14 (×6): 65 mg via INTRAVENOUS
  Filled 2024-01-12 (×6): qty 1

## 2024-01-12 MED ORDER — CHLORDIAZEPOXIDE HCL 5 MG PO CAPS
10.0000 mg | ORAL_CAPSULE | Freq: Three times a day (TID) | ORAL | Status: DC
  Administered 2024-01-12 – 2024-01-15 (×10): 10 mg via ORAL
  Filled 2024-01-12 (×10): qty 2

## 2024-01-12 MED ORDER — IBUPROFEN 400 MG PO TABS
400.0000 mg | ORAL_TABLET | Freq: Four times a day (QID) | ORAL | Status: DC | PRN
  Administered 2024-01-12 – 2024-01-14 (×3): 400 mg via ORAL
  Filled 2024-01-12 (×3): qty 1

## 2024-01-12 NOTE — Progress Notes (Signed)
   01/12/24 0958  Mobility  Activity Ambulated with assistance in hallway  Level of Assistance Contact guard assist, steadying assist  Assistive Device Front wheel walker  Distance Ambulated (ft) 100 ft  Activity Response Tolerated fair  Mobility Referral Yes  Mobility visit 1 Mobility  Mobility Specialist Start Time (ACUTE ONLY) 0958  Mobility Specialist Stop Time (ACUTE ONLY) 1028  Mobility Specialist Time Calculation (min) (ACUTE ONLY) 30 min   Mobility Specialist: Progress Note  Pre-Mobility:      HR 75,                          BP 109/79 (87) Post-Mobility:    HR 78, SpO2 95% RA  Pt agreeable to mobility session - received in bed. C/o  headache, "wobbly" BLE's, pain in kidneys -RN notified and aware. Returned to bed with all needs met - call bell within reach. Bed alarm on. Tele Sitter Present.  Isla Mari, BS Mobility Specialist Please contact via SecureChat or  Rehab office at [phone].

## 2024-01-12 NOTE — Progress Notes (Signed)
 PROGRESS NOTE                                                                                                                                                                                                             Patient Demographics:    [name], is a 53 y.o. male, DOB - Feb 05, 1971, GMW:[id number]  Outpatient Primary MD for the patient is Abraham Abo, MD    LOS - 4  Admit date - 01/08/2024    Chief Complaint  Patient presents with   Seizures       Brief Narrative (HPI from H&P)    53 y.o. male with medical history significant of etoh abuse, cirrhosis (portal HTN, esophageal varices, ascites), schizoaffective disorder, seizure disorder and other medical issues here after suspected seizure. He notes that he stopped drinking and a few days later "I think I had a seizure". "I stopped because I have cirrhosis, I can't be doing that." He notes that he was "on a bender" this month, drinking 5 twenty four oz 4 lokos a day. On further discussion, sounds like he had nausea/vomiting and abdominal discomfort that also contributed to his decision to stop drinking. He doesn't remember events around suspected seizure, but he woke up disoriented in a doorway. There was blood everywhere from his L eye laceration.    Subjective:   Patient in bed, appears comfortable, denies any headache, no fever, no chest pain or pressure, no shortness of breath , no abdominal pain. No new focal weakness.    Assessment  & Plan :    Complicated Alcohol  Withdrawal  Alcohol  Withdrawal Seizures Strictly counseled to quit alcohol , this is a recurrent problem, on Librium  and CIWA protocol, along with phenobarb, overall improving, start tapering phenobarb and Librium .   Traumatic Fall in the Setting of Suspected Etoh withdrawal seizure Left Periorbial Laceration (s/p repair with tissue adhesive) CT head and maxillofacial no acute changes, PT eval,  supportive care.   Abdominal Pain  Nausea  Vomiting Much improved after supportive care continue PPI and antiemetics, stable lipase   Alcoholic Hepatitis.  Fatty liver versus alcoholic cirrhosis.  Portal Hypertensive Gastropathy  Ascites  Chronic thrombocytopenia Maddrey discriminant function low, no need for steroids, ultrasound stable, continue to monitor with supportive care.  Resume Aldactone  upon discharge was dehydrated upon admission.  Outpatient [facility] follow-up postdischarge.  Hypokalemia, hypomagnesemia, hypophosphatemia Replaced   Seizure Disorder Continue keppra  Breakthrough seizure in setting of etoh withdrawal above   Gout Allopurinol    Chronic Pain Gabapentin    Mood Disorder Schizoaffective Disorder Continue nightly seroquel  and zoloft   GERD.  On PPI.        Condition - Fair  Family Communication  : None present  Code Status :  Full  Consults  :   None  PUD Prophylaxis :  PPI   Procedures  :     CT head, abdominal ultrasound.  Nonacute.      Disposition Plan  :    Status is: Inpatient   DVT Prophylaxis  :    SCDs Start: 01/08/24 2002    Lab Results  Component Value Date   PLT 139 (L) 01/12/2024    Diet :  Diet Order             Diet regular Room service appropriate? Yes; Fluid consistency: Thin  Diet effective now                    Inpatient Medications  Scheduled Meds:  allopurinol   100 mg Oral Daily   chlordiazePOXIDE   10 mg Oral TID   folic acid   1 mg Oral Daily   gabapentin   300 mg Oral BID   lactulose   30 g Oral BID   levETIRAcetam   500 mg Oral BID   multivitamin with minerals  1 tablet Oral Daily   nicotine   21 mg Transdermal Daily   pantoprazole   40 mg Oral Daily   PHENObarbital  65 mg Intravenous BID   QUEtiapine   200 mg Oral QHS   sertraline   25 mg Oral Daily   thiamine   100 mg Oral Daily   Continuous Infusions:  lactated ringers      lactated ringers  50 mL/hr at 01/11/24 1746   magnesium   sulfate bolus IVPB     magnesium  sulfate bolus IVPB     PRN Meds:.ondansetron  **OR** ondansetron  (ZOFRAN ) IV, polyethylene glycol  Antibiotics  :    Anti-infectives (From admission, onward)    None         Objective:   Vitals:   01/11/24 1627 01/11/24 1952 01/11/24 2353 01/12/24 0353  BP: 112/70 107/65 114/63 114/79  Pulse: 80 75 80 86  Resp: 19     Temp: 98.8 F (37.1 C) 98.6 F (37 C) 97.6 F (36.4 C) (!) 97.2 F (36.2 C)  TempSrc: Oral Oral Oral Oral  SpO2: 95% 100% 98% 97%  Weight:      Height:        Wt Readings from Last 3 Encounters:  01/08/24 94.3 kg  12/01/23 101.7 kg  11/11/23 99.8 kg     Intake/Output Summary (Last 24 hours) at 01/12/2024 0721 Last data filed at 01/12/2024 0353 Gross per 24 hour  Intake --  Output 1275 ml  Net -1275 ml     Physical Exam  Awake   No new F.N deficits, improved DTs, L periorbital hematoma, mild Supple Neck, No JVD,   Symmetrical Chest wall movement, Good air movement bilaterally, CTAB RRR,No Gallops,Rubs or new Murmurs,  +ve B.Sounds, Abd Soft, No tenderness,   No Cyanosis, Clubbing or edema       Data Review:    Recent Labs  Lab 01/08/24 0245 01/08/24 0845 01/09/24 0601 01/10/24 0418 01/11/24 0443 01/12/24 0424  WBC 7.0 5.8 3.7* 5.1 4.8 4.6  HGB 14.7 14.4 13.1 12.5* 12.1* 12.1*  HCT 41.6 41.4 38.5* 36.8*  36.1* 36.1*  PLT 64* 59* 66* 84* 96* 139*  MCV 95.4 97.0 98.7 98.7 99.7 99.7  MCH 33.7 33.7 33.6 33.5 33.4 33.4  MCHC 35.3 34.8 34.0 34.0 33.5 33.5  RDW 14.7 14.8 15.1 15.2 15.7* 15.9*  LYMPHSABS 0.9 1.4  --  1.6 1.5 1.6  MONOABS 0.8 0.7  --  0.7 0.7 0.6  EOSABS 0.0 0.0  --  0.0 0.1 0.1  BASOSABS 0.0 0.0  --  0.0 0.0 0.0    Recent Labs  Lab 01/08/24 0845 01/09/24 0558 01/09/24 0601 01/10/24 0418 01/11/24 0443 01/12/24 0424  NA 130*  --  135 133* 135 133*  K 3.1*  --  3.3* 3.4* 4.0 4.0  CL 88*  --  97* 101 107 107  CO2 29  --  27 23 21* 21*  ANIONGAP 13  --  11 9 7 5   GLUCOSE 106*   --  88 105* 99 104*  BUN 9  --  13 12 11 14   CREATININE 1.10  --  0.86 0.99 0.90 0.92  AST 61*  --  95* 164* 127* 105*  ALT 29  --  38 70* 70* 69*  ALKPHOS 84  --  78 83 80 89  BILITOT 3.9*  --  3.6* 2.7* 2.4* 1.9*  ALBUMIN  3.4*  --  3.1* 3.1* 2.9* 2.9*  INR 1.1  --   --   --   --   --   AMMONIA  --   --  45* 63* 35 40*  BNP  --   --  54.3  --   --   --   MG 2.7* 2.1  --  1.4* 1.8 1.6*  PHOS  --  3.0  --  2.1* 5.2* 4.3  CALCIUM  8.0*  --  8.5* 8.8* 8.7* 8.9      Recent Labs  Lab 01/08/24 0845 01/09/24 0558 01/09/24 0601 01/10/24 0418 01/11/24 0443 01/12/24 0424  INR 1.1  --   --   --   --   --   AMMONIA  --   --  45* 63* 35 40*  BNP  --   --  54.3  --   --   --   MG 2.7* 2.1  --  1.4* 1.8 1.6*  CALCIUM  8.0*  --  8.5* 8.8* 8.7* 8.9    --------------------------------------------------------------------------------------------------------------- Lab Results  Component Value Date   CHOL 159 03/02/2019   HDL 53 03/02/2019   LDLCALC 84 03/02/2019   TRIG 164 (H) 07/21/2020   CHOLHDL 3.0 03/02/2019    Lab Results  Component Value Date   HGBA1C 5.5 09/07/2021      Radiology Report No results found.    Signature  -   Lynnwood Sauer M.D on 01/12/2024 at 7:21 AM   -  To page go to [email or web address]

## 2024-01-12 NOTE — Plan of Care (Signed)
   Problem: Activity: Goal: Risk for activity intolerance will decrease Outcome: Progressing   Problem: Nutrition: Goal: Adequate nutrition will be maintained Outcome: Progressing   Problem: Coping: Goal: Level of anxiety will decrease Outcome: Progressing

## 2024-01-12 NOTE — Progress Notes (Signed)
 Patient prefers discharge medications sent to the [facility] on the corner of [address]. Oncoming RN made aware.

## 2024-01-12 NOTE — Care Management Important Message (Signed)
 Important Message  Patient Details  Name: [name] MRN: [id number] Date of Birth: 19-Mar-1971   Important Message Given:  Yes - Medicare IM     Felix Host 01/12/2024, 3:33 PM

## 2024-01-13 DIAGNOSIS — R569 Unspecified convulsions: Secondary | ICD-10-CM | POA: Diagnosis not present

## 2024-01-13 LAB — COMPREHENSIVE METABOLIC PANEL WITH GFR
ALT: 64 U/L — ABNORMAL HIGH (ref 0–44)
AST: 93 U/L — ABNORMAL HIGH (ref 15–41)
Albumin: 2.8 g/dL — ABNORMAL LOW (ref 3.5–5.0)
Alkaline Phosphatase: 85 U/L (ref 38–126)
Anion gap: 5 (ref 5–15)
BUN: 13 mg/dL (ref 6–20)
CO2: 20 mmol/L — ABNORMAL LOW (ref 22–32)
Calcium: 8.6 mg/dL — ABNORMAL LOW (ref 8.9–10.3)
Chloride: 109 mmol/L (ref 98–111)
Creatinine, Ser: 0.7 mg/dL (ref 0.61–1.24)
GFR, Estimated: >60 mL/min (ref >60)
Glucose, Bld: 118 mg/dL — ABNORMAL HIGH (ref 70–99)
Potassium: 3.8 mmol/L (ref 3.5–5.1)
Sodium: 134 mmol/L — ABNORMAL LOW (ref 135–145)
Total Bilirubin: 1.3 mg/dL — ABNORMAL HIGH (ref 0.0–1.2)
Total Protein: 5.7 g/dL — ABNORMAL LOW (ref 6.5–8.1)

## 2024-01-13 LAB — CBC WITH DIFFERENTIAL/PLATELET
Abs Immature Granulocytes: 0.04 10*3/uL (ref 0.00–0.07)
Basophils Absolute: 0 10*3/uL (ref 0.0–0.1)
Basophils Relative: 1 %
Eosinophils Absolute: 0.1 10*3/uL (ref 0.0–0.5)
Eosinophils Relative: 2 %
HCT: 36 % — ABNORMAL LOW (ref 39.0–52.0)
Hemoglobin: 12.4 g/dL — ABNORMAL LOW (ref 13.0–17.0)
Immature Granulocytes: 1 %
Lymphocytes Relative: 34 %
Lymphs Abs: 1.6 10*3/uL (ref 0.7–4.0)
MCH: 34.6 pg — ABNORMAL HIGH (ref 26.0–34.0)
MCHC: 34.4 g/dL (ref 30.0–36.0)
MCV: 100.6 fL — ABNORMAL HIGH (ref 80.0–100.0)
Monocytes Absolute: 0.6 10*3/uL (ref 0.1–1.0)
Monocytes Relative: 13 %
Neutro Abs: 2.3 10*3/uL (ref 1.7–7.7)
Neutrophils Relative %: 49 %
Platelets: 163 10*3/uL (ref 150–400)
RBC: 3.58 MIL/uL — ABNORMAL LOW (ref 4.22–5.81)
RDW: 16 % — ABNORMAL HIGH (ref 11.5–15.5)
WBC: 4.6 10*3/uL (ref 4.0–10.5)
nRBC: 0 % (ref 0.0–0.2)

## 2024-01-13 LAB — AMMONIA: Ammonia: 38 umol/L — ABNORMAL HIGH (ref 9–35)

## 2024-01-13 LAB — PHOSPHORUS: Phosphorus: 3.2 mg/dL (ref 2.5–4.6)

## 2024-01-13 LAB — MAGNESIUM: Magnesium: 1.8 mg/dL (ref 1.7–2.4)

## 2024-01-13 MED ORDER — MIDODRINE HCL 5 MG PO TABS
5.0000 mg | ORAL_TABLET | Freq: Three times a day (TID) | ORAL | Status: DC
  Administered 2024-01-13 – 2024-01-15 (×6): 5 mg via ORAL
  Filled 2024-01-13 (×6): qty 1

## 2024-01-13 NOTE — Progress Notes (Signed)
 Physical Therapy Treatment Patient Details Name: Clemmie Marxen MRN: [id number] DOB: 04-Jun-1971 Today's Date: 01/13/2024   History of Present Illness 53 y.o. male Admitted 5/23 with Alcohol  Withdrawal Seizures. Traumatic Fall in the Setting of Suspected Etoh withdrawal seizure, Left Periorbial Laceration (s/p repair with tissue adhesive). with medical history significant of etoh abuse, cirrhosis (portal HTN, esophageal varices, ascites), schizoaffective disorder, seizure disorder.    PT Comments  Patient resting in bed and finishing breakfast. Pt agreeable to mobilize with therapy and completing bed mobility at Mod ind level with use of bed features. Pt stable with sit<>stand with RW, sup for safety. Pt amb ~200' with RW, overall steady with no LOB noted, pt has tendency to drift to Rt side of walker and Rt side of hall, cues for positioning intermittently. EOS pt repositioned in recliner, Alarm on and call bell within reach. Will continue to progress pt as able during acute stay.     If plan is discharge home, recommend the following: A little help with walking and/or transfers;Assist for transportation;Help with stairs or ramp for entrance;A little help with bathing/dressing/bathroom;Assistance with cooking/housework   Can travel by private vehicle        Equipment Recommendations  Rolling walker (2 wheels)    Recommendations for Other Services       Precautions / Restrictions Precautions Precautions: Fall Recall of Precautions/Restrictions: Intact Restrictions Weight Bearing Restrictions Per Provider Order: No     Mobility  Bed Mobility Overal bed mobility: Needs Assistance Bed Mobility: Supine to Sit     Supine to sit: Modified independent (Device/Increase time), HOB elevated     General bed mobility comments: Mod I with HOB elevated and use of bed rail    Transfers Overall transfer level: Needs assistance Equipment used: Rolling walker (2 wheels), None Transfers: Sit  to/from Stand Sit to Stand: Supervision, Contact guard assist           General transfer comment: CGA/sup from EOB to RW, no overt LOB noted. pt stable in static standing. EOS good awareness for safe back up to recliner and reach back to lower.    Ambulation/Gait Ambulation/Gait assistance: Contact guard assist Gait Distance (Feet): 200 Feet Assistive device: Rolling walker (2 wheels) Gait Pattern/deviations: Drifts right/left, Step-through pattern, Decreased step length - left, Decreased step length - right, Decreased stride length, Shuffle Gait velocity: decr     General Gait Details: pt amb with RW in hallway, ~200', no buckling or overt LOB. intermittent cues to maintain safe proximity to RW and deter drifting towards Rt in hallway. VSS throughout (pre-gait bp: 102/66 post gait bp: 98/66).   Stairs             Wheelchair Mobility     Tilt Bed    Modified Rankin (Stroke Patients Only)       Balance Overall balance assessment: Needs assistance Sitting-balance support: No upper extremity supported, Feet supported       Standing balance support: Bilateral upper extremity supported, No upper extremity supported, During functional activity Standing balance-Leahy Scale: Fair                              Hotel manager: No apparent difficulties  Cognition Arousal: Alert Behavior During Therapy: Flat affect   PT - Cognitive impairments: No apparent impairments                         Following  commands: Intact      Cueing Cueing Techniques: Verbal cues  Exercises      General Comments        Pertinent Vitals/Pain Pain Assessment Pain Assessment: Faces Faces Pain Scale: Hurts a little bit Pain Location: headache Pain Descriptors / Indicators: Discomfort, Headache Pain Intervention(s): Limited activity within patient's tolerance, Monitored during session, Repositioned    Home Living                           Prior Function            PT Goals (current goals can now be found in the care plan section) Acute Rehab PT Goals Patient Stated Goal: return home PT Goal Formulation: With patient Time For Goal Achievement: 01/23/24 Potential to Achieve Goals: Good Progress towards PT goals: Progressing toward goals    Frequency    Min 2X/week      PT Plan      Co-evaluation              AM-PAC PT "6 Clicks" Mobility   Outcome Measure  Help needed turning from your back to your side while in a flat bed without using bedrails?: None Help needed moving from lying on your back to sitting on the side of a flat bed without using bedrails?: None Help needed moving to and from a bed to a chair (including a wheelchair)?: A Little Help needed standing up from a chair using your arms (e.g., wheelchair or bedside chair)?: None Help needed to walk in hospital room?: A Little Help needed climbing 3-5 steps with a railing? : A Little 6 Click Score: 21    End of Session Equipment Utilized During Treatment: Gait belt Activity Tolerance: Patient tolerated treatment well Patient left: in chair;with call bell/phone within reach;with chair alarm set Nurse Communication: Mobility status PT Visit Diagnosis: Other abnormalities of gait and mobility (R26.89);Repeated falls (R29.6);Muscle weakness (generalized) (M62.81);History of falling (Z91.81);Other symptoms and signs involving the nervous system (R29.898);Pain Pain - Right/Left: Left Pain - part of body: Shoulder     Time: 4098-1191 PT Time Calculation (min) (ACUTE ONLY): 25 min  Charges:    $Gait Training: 8-22 mins $Therapeutic Activity: 8-22 mins PT General Charges $$ ACUTE PT VISIT: 1 Visit                     Tish Forge, DPT Acute Rehabilitation Services Office [phone]  01/13/24 11:18 AM

## 2024-01-13 NOTE — Progress Notes (Signed)
 PROGRESS NOTE                                                                                                                                                                                                             Patient Demographics:    [name], is a 53 y.o. male, DOB - 08-Mar-1971, [id number]  Outpatient Primary MD for the patient is Abraham Abo, MD    LOS - 5  Admit date - 01/08/2024    Chief Complaint  Patient presents with   Seizures       Brief Narrative (HPI from H&P)     53 y.o. male with medical history significant of etoh abuse, cirrhosis (portal HTN, esophageal varices, ascites), schizoaffective disorder, seizure disorder and other medical issues here after suspected seizure. He notes that he stopped drinking and a few days later "I think I had a seizure". "I stopped because I have cirrhosis, I can't be doing that." He notes that he was "on a bender" this month, drinking 5 twenty four oz 4 lokos a day. On further discussion, sounds like he had nausea/vomiting and abdominal discomfort that also contributed to his decision to stop drinking. He doesn't remember events around suspected seizure, but he woke up disoriented in a doorway. There was blood everywhere from his L eye laceration.    Subjective:   No significant events overnight, reports generalized weakness, fatigue, still having tremors    Assessment  & Plan :    Complicated Alcohol  Withdrawal  Alcohol  Withdrawal Seizures Strictly counseled to quit alcohol , this is a recurrent problem, on Librium  and CIWA protocol, along with phenobarb, overall improving, remains in significant withdrawals, so continue with both phenobarbital and Librium , will start tapering in 24 hours.    Traumatic Fall in the Setting of Suspected Etoh withdrawal seizure Left Periorbial Laceration (s/p repair with tissue adhesive) CT head and maxillofacial no acute  changes, PT eval, supportive care.   Abdominal Pain  Nausea  Vomiting Much improved after supportive care continue PPI and antiemetics, stable lipase   Alcoholic Hepatitis.  Fatty liver versus alcoholic cirrhosis.  Portal Hypertensive Gastropathy  Ascites  Chronic thrombocytopenia Maddrey discriminant function low, no need for steroids, ultrasound stable, continue to monitor with supportive care.  Resume Aldactone  upon discharge was dehydrated upon admission.  Outpatient [facility] follow-up postdischarge.  Hypokalemia,  hypomagnesemia, hypophosphatemia Replaced  Hypotension -Blood pressure is soft, will start on low-dose midodrine    Seizure Disorder Continue keppra  Breakthrough seizure in setting of etoh withdrawal above   Gout Allopurinol    Chronic Pain Gabapentin    Mood Disorder Schizoaffective Disorder Continue nightly seroquel  and zoloft   GERD.  On PPI.        Condition - Fair  Family Communication  : None present  Code Status :  Full  Consults  :   None  PUD Prophylaxis :  PPI   Procedures  :     CT head, abdominal ultrasound.  Nonacute.      Disposition Plan  :    Status is: Inpatient   DVT Prophylaxis  :    SCDs Start: 01/08/24 2002    Lab Results  Component Value Date   PLT 163 01/13/2024    Diet :  Diet Order             Diet regular Room service appropriate? Yes; Fluid consistency: Thin  Diet effective now                    Inpatient Medications  Scheduled Meds:  allopurinol   100 mg Oral Daily   chlordiazePOXIDE   10 mg Oral TID   folic acid   1 mg Oral Daily   gabapentin   300 mg Oral BID   lactulose   30 g Oral BID   levETIRAcetam   500 mg Oral BID   multivitamin with minerals  1 tablet Oral Daily   nicotine   21 mg Transdermal Daily   pantoprazole   40 mg Oral Daily   PHENObarbital  65 mg Intravenous BID   QUEtiapine   200 mg Oral QHS   sertraline   25 mg Oral Daily   thiamine   100 mg Oral Daily   Continuous  Infusions:  magnesium  sulfate bolus IVPB     PRN Meds:.acetaminophen , ibuprofen , ondansetron  **OR** ondansetron  (ZOFRAN ) IV, polyethylene glycol  Antibiotics  :    Anti-infectives (From admission, onward)    None         Objective:   Vitals:   01/12/24 1937 01/13/24 0010 01/13/24 0329 01/13/24 0806  BP: 102/67 109/62 (!) 88/51 103/72  Pulse: 79 77 79 83  Resp: 16 17 18 18   Temp: 98.1 F (36.7 C) 98 F (36.7 C) 98.2 F (36.8 C) 98 F (36.7 C)  TempSrc: Oral Oral Oral Oral  SpO2: 91% 98%    Weight:      Height:        Wt Readings from Last 3 Encounters:  01/08/24 94.3 kg  12/01/23 101.7 kg  11/11/23 99.8 kg     Intake/Output Summary (Last 24 hours) at 01/13/2024 1157 Last data filed at 01/13/2024 0700 Gross per 24 hour  Intake 94.57 ml  Output 2175 ml  Net -2080.43 ml     Physical Exam             Data Review:    Recent Labs  Lab 01/08/24 0845 01/09/24 0601 01/10/24 0418 01/11/24 0443 01/12/24 0424 01/13/24 0434  WBC 5.8 3.7* 5.1 4.8 4.6 4.6  HGB 14.4 13.1 12.5* 12.1* 12.1* 12.4*  HCT 41.4 38.5* 36.8* 36.1* 36.1* 36.0*  PLT 59* 66* 84* 96* 139* 163  MCV 97.0 98.7 98.7 99.7 99.7 100.6*  MCH 33.7 33.6 33.5 33.4 33.4 34.6*  MCHC 34.8 34.0 34.0 33.5 33.5 34.4  RDW 14.8 15.1 15.2 15.7* 15.9* 16.0*  LYMPHSABS 1.4  --  1.6 1.5 1.6 1.6  MONOABS 0.7  --  0.7 0.7 0.6 0.6  EOSABS 0.0  --  0.0 0.1 0.1 0.1  BASOSABS 0.0  --  0.0 0.0 0.0 0.0    Recent Labs  Lab 01/08/24 0845 01/09/24 0558 01/09/24 0601 01/10/24 0418 01/11/24 0443 01/12/24 0424 01/13/24 0434  NA 130*  --  135 133* 135 133* 134*  K 3.1*  --  3.3* 3.4* 4.0 4.0 3.8  CL 88*  --  97* 101 107 107 109  CO2 29  --  27 23 21* 21* 20*  ANIONGAP 13  --  11 9 7 5 5   GLUCOSE 106*  --  88 105* 99 104* 118*  BUN 9  --  13 12 11 14 13   CREATININE 1.10  --  0.86 0.99 0.90 0.92 0.70  AST 61*  --  95* 164* 127* 105* 93*  ALT 29  --  38 70* 70* 69* 64*  ALKPHOS 84  --  78 83 80 89 85   BILITOT 3.9*  --  3.6* 2.7* 2.4* 1.9* 1.3*  ALBUMIN  3.4*  --  3.1* 3.1* 2.9* 2.9* 2.8*  INR 1.1  --   --   --   --   --   --   AMMONIA  --   --  45* 63* 35 40* 38*  BNP  --   --  54.3  --   --   --   --   MG 2.7* 2.1  --  1.4* 1.8 1.6* 1.8  PHOS  --  3.0  --  2.1* 5.2* 4.3 3.2  CALCIUM  8.0*  --  8.5* 8.8* 8.7* 8.9 8.6*      Recent Labs  Lab 01/08/24 0845 01/09/24 0558 01/09/24 0601 01/10/24 0418 01/11/24 0443 01/12/24 0424 01/13/24 0434  INR 1.1  --   --   --   --   --   --   AMMONIA  --   --  45* 63* 35 40* 38*  BNP  --   --  54.3  --   --   --   --   MG 2.7* 2.1  --  1.4* 1.8 1.6* 1.8  CALCIUM  8.0*  --  8.5* 8.8* 8.7* 8.9 8.6*    --------------------------------------------------------------------------------------------------------------- Lab Results  Component Value Date   CHOL 159 03/02/2019   HDL 53 03/02/2019   LDLCALC 84 03/02/2019   TRIG 164 (H) 07/21/2020   CHOLHDL 3.0 03/02/2019    Lab Results  Component Value Date   HGBA1C 5.5 09/07/2021      Radiology Report No results found.    Signature  -   Seena Dadds M.D on 01/13/2024 at 11:57 AM   -  To page go to [email or web address]

## 2024-01-13 NOTE — TOC Progression Note (Signed)
 Transition of Care [facility]) - Progression Note    Patient Details  Name: [name] MRN: [id number] Date of Birth: [birth year]  Transition of [facility] CM/SW Contact  Eusebio High, RN Phone Number: 01/13/2024, 1:54 PM  Clinical Narrative:    OPPT has been arranged for patient  AVS updated. A rolling walker has been ordered thru [employer] and will be delivered bedside.   TOC will continue to follow patient for any additional discharge needs        Expected Discharge Plan: OP Rehab Barriers to Discharge: No Barriers Identified  Expected Discharge Plan and Services   Discharge Planning Services: CM Consult   Living arrangements for the past 2 months: Single Family Home                   DME Agency: NA       HH Arranged: NA           Social Determinants of Health (SDOH) Interventions SDOH Screenings   Food Insecurity: Food Insecurity Present (01/08/2024)  Housing: High Risk (01/08/2024)  Transportation Needs: Unmet Transportation Needs (01/08/2024)  Utilities: At Risk (01/08/2024)  Alcohol  Screen: High Risk (12/01/2023)  Depression (PHQ2-9): Medium Risk (12/01/2023)  Financial Resource Strain: High Risk (12/01/2023)  Physical Activity: Insufficiently Active (12/01/2023)  Social Connections: Moderately Integrated (12/01/2023)  Stress: Stress Concern Present (12/01/2023)  Tobacco Use: High Risk (01/08/2024)  Health Literacy: Adequate Health Literacy (12/01/2023)    Readmission Risk Interventions    09/19/2021   12:21 PM  Readmission Risk Prevention Plan  HRI or Home Care Consult Complete  Social Work Consult for Recovery Care Planning/Counseling Complete  Palliative Care Screening Not Applicable

## 2024-01-13 NOTE — Plan of Care (Signed)
  Problem: Clinical Measurements: Goal: Will remain free from infection Outcome: Progressing Goal: Diagnostic test results will improve Outcome: Progressing Goal: Respiratory complications will improve Outcome: Progressing   Problem: Activity: Goal: Risk for activity intolerance will decrease Outcome: Progressing   Problem: Nutrition: Goal: Adequate nutrition will be maintained Outcome: Progressing   Problem: Coping: Goal: Level of anxiety will decrease Outcome: Progressing   Problem: Elimination: Goal: Will not experience complications related to bowel motility Outcome: Progressing   Problem: Pain Managment: Goal: General experience of comfort will improve and/or be controlled Outcome: Progressing

## 2024-01-14 ENCOUNTER — Inpatient Hospital Stay (HOSPITAL_COMMUNITY)

## 2024-01-14 DIAGNOSIS — I9589 Other hypotension: Secondary | ICD-10-CM | POA: Diagnosis not present

## 2024-01-14 DIAGNOSIS — R569 Unspecified convulsions: Secondary | ICD-10-CM | POA: Diagnosis not present

## 2024-01-14 LAB — ECHOCARDIOGRAM COMPLETE
Area-P 1/2: 3.85 cm2
Height: 72 in
S' Lateral: 3 cm
Weight: 3324.8 [oz_av]

## 2024-01-14 LAB — CBC WITH DIFFERENTIAL/PLATELET
Abs Immature Granulocytes: 0.05 10*3/uL (ref 0.00–0.07)
Basophils Absolute: 0.1 10*3/uL (ref 0.0–0.1)
Basophils Relative: 1 %
Eosinophils Absolute: 0.1 10*3/uL (ref 0.0–0.5)
Eosinophils Relative: 2 %
HCT: 36.7 % — ABNORMAL LOW (ref 39.0–52.0)
Hemoglobin: 12.2 g/dL — ABNORMAL LOW (ref 13.0–17.0)
Immature Granulocytes: 1 %
Lymphocytes Relative: 38 %
Lymphs Abs: 2.1 10*3/uL (ref 0.7–4.0)
MCH: 33.7 pg (ref 26.0–34.0)
MCHC: 33.2 g/dL (ref 30.0–36.0)
MCV: 101.4 fL — ABNORMAL HIGH (ref 80.0–100.0)
Monocytes Absolute: 0.9 10*3/uL (ref 0.1–1.0)
Monocytes Relative: 16 %
Neutro Abs: 2.3 10*3/uL (ref 1.7–7.7)
Neutrophils Relative %: 42 %
Platelets: 201 10*3/uL (ref 150–400)
RBC: 3.62 MIL/uL — ABNORMAL LOW (ref 4.22–5.81)
RDW: 16 % — ABNORMAL HIGH (ref 11.5–15.5)
WBC: 5.6 10*3/uL (ref 4.0–10.5)
nRBC: 0 % (ref 0.0–0.2)

## 2024-01-14 LAB — COMPREHENSIVE METABOLIC PANEL WITH GFR
ALT: 73 U/L — ABNORMAL HIGH (ref 0–44)
AST: 99 U/L — ABNORMAL HIGH (ref 15–41)
Albumin: 2.8 g/dL — ABNORMAL LOW (ref 3.5–5.0)
Alkaline Phosphatase: 76 U/L (ref 38–126)
Anion gap: 8 (ref 5–15)
BUN: 14 mg/dL (ref 6–20)
CO2: 20 mmol/L — ABNORMAL LOW (ref 22–32)
Calcium: 8.9 mg/dL (ref 8.9–10.3)
Chloride: 106 mmol/L (ref 98–111)
Creatinine, Ser: 0.88 mg/dL (ref 0.61–1.24)
GFR, Estimated: >60 mL/min (ref >60)
Glucose, Bld: 93 mg/dL (ref 70–99)
Potassium: 3.9 mmol/L (ref 3.5–5.1)
Sodium: 134 mmol/L — ABNORMAL LOW (ref 135–145)
Total Bilirubin: 1 mg/dL (ref 0.0–1.2)
Total Protein: 5.7 g/dL — ABNORMAL LOW (ref 6.5–8.1)

## 2024-01-14 LAB — AMMONIA: Ammonia: 17 umol/L (ref 9–35)

## 2024-01-14 MED ORDER — PHENOBARBITAL 32.4 MG PO TABS
32.4000 mg | ORAL_TABLET | Freq: Two times a day (BID) | ORAL | Status: DC
  Administered 2024-01-15: 32.4 mg via ORAL
  Filled 2024-01-14: qty 1

## 2024-01-14 NOTE — Progress Notes (Signed)
   01/14/24 0930  Mobility  Activity Ambulated with assistance in hallway  Level of Assistance Standby assist, set-up cues, supervision of patient - no hands on  Assistive Device Front wheel walker  Distance Ambulated (ft) 500 ft  Activity Response Tolerated fair  Mobility Referral Yes  Mobility visit 1 Mobility  Mobility Specialist Start Time (ACUTE ONLY) 0930  Mobility Specialist Stop Time (ACUTE ONLY) 0945  Mobility Specialist Time Calculation (min) (ACUTE ONLY) 15 min   Mobility Specialist: Progress Note  Pre-Mobility:      HR 82, SpO2 97% RA During Mobility: HR 98 Post-Mobility:    HR 74, SpO2 95% RA  Pt agreeable to mobility session - received in bed. C/o headache and sore on tongue. Returned to bed with all needs met - call bell within reach.  Tele sitter present (pt does not like this).  Isla Mari, BS Mobility Specialist Please contact via SecureChat or  Rehab office at [phone].

## 2024-01-14 NOTE — Plan of Care (Signed)

## 2024-01-14 NOTE — Progress Notes (Signed)
 PROGRESS NOTE                                                                                                                                                                                                             Patient Demographics:    [name], is a 53 y.o. male, DOB - [birth year], [id number]  Outpatient Primary MD for the patient is Abraham Abo, MD    LOS - 6  Admit date - 01/08/2024    Chief Complaint  Patient presents with   Seizures       Brief Narrative (HPI from H&P)     53 y.o. male with medical history significant of etoh abuse, cirrhosis (portal HTN, esophageal varices, ascites), schizoaffective disorder, seizure disorder and other medical issues here after suspected seizure. He notes that he stopped drinking and a few days later "I think I had a seizure". "I stopped because I have cirrhosis, I can't be doing that." He notes that he was "on a bender" this month, drinking 5 twenty four oz 4 lokos a day. On further discussion, sounds like he had nausea/vomiting and abdominal discomfort that also contributed to his decision to stop drinking. He doesn't remember events around suspected seizure, but he woke up disoriented in a doorway. There was blood everywhere from his L eye laceration.    Subjective:   No significant events overnight, reports good night sleep, was able to sit on the chair yesterday and ambulate with staff assistance    Assessment  & Plan :    Complicated Alcohol  Withdrawal  Alcohol  Withdrawal Seizures Strictly counseled to quit alcohol , this is a recurrent problem, on Librium  and CIWA protocol, along with phenobarb, overall improving, continue with phenobarbital taper, to stop over next 24 hours, still with withdrawals, so we will continue with Librium  at the current dose .    Traumatic Fall in the Setting of Suspected Etoh withdrawal seizure Left Periorbial Laceration (s/p  repair with tissue adhesive) CT head and maxillofacial no acute changes, PT eval, supportive care.   Abdominal Pain  Nausea  Vomiting Much improved after supportive care continue PPI and antiemetics, stable lipase   Alcoholic Hepatitis.  Fatty liver versus alcoholic cirrhosis.  Portal Hypertensive Gastropathy  Ascites  Chronic thrombocytopenia Maddrey discriminant function low, no need for steroids, ultrasound stable, continue to monitor with supportive care.  Resume Aldactone  upon discharge was dehydrated upon admission.  Outpatient [facility] follow-up postdischarge. - Left he is improving  Hypokalemia, hypomagnesemia, hypophosphatemia Replaced  Hypotension - Pressure remains soft, so we will continue with midodrine , and hold on resuming home medications including Aldactone  and Toprol -XL -Will check 2D echo to rule out cardiomyopathy contributing to his low blood pressure   Seizure Disorder Continue keppra  Breakthrough seizure in setting of etoh withdrawal above   Gout Allopurinol    Chronic Pain Gabapentin    Mood Disorder Schizoaffective Disorder Continue nightly seroquel  and zoloft   GERD.  On PPI.        Condition - Fair  Family Communication  : None present  Code Status :  Full  Consults  :   None  PUD Prophylaxis :  PPI   Procedures  :     CT head, abdominal ultrasound.  Nonacute.      Disposition Plan  :    Status is: Inpatient   DVT Prophylaxis  :    SCDs Start: 01/08/24 2002    Lab Results  Component Value Date   PLT 201 01/14/2024    Diet :  Diet Order             Diet regular Room service appropriate? Yes; Fluid consistency: Thin  Diet effective now                    Inpatient Medications  Scheduled Meds:  allopurinol   100 mg Oral Daily   chlordiazePOXIDE   10 mg Oral TID   folic acid   1 mg Oral Daily   gabapentin   300 mg Oral BID   lactulose   30 g Oral BID   levETIRAcetam   500 mg Oral BID   midodrine   5 mg Oral  TID WC   multivitamin with minerals  1 tablet Oral Daily   nicotine   21 mg Transdermal Daily   pantoprazole   40 mg Oral Daily   PHENObarbital  65 mg Intravenous BID   [START ON 01/15/2024] phenobarbital  32.4 mg Oral BID   QUEtiapine   200 mg Oral QHS   sertraline   25 mg Oral Daily   thiamine   100 mg Oral Daily   Continuous Infusions:  magnesium  sulfate bolus IVPB     PRN Meds:.acetaminophen , ibuprofen , ondansetron  **OR** ondansetron  (ZOFRAN ) IV, polyethylene glycol  Antibiotics  :    Anti-infectives (From admission, onward)    None         Objective:   Vitals:   01/13/24 2000 01/14/24 0112 01/14/24 0413 01/14/24 0817  BP: 90/67 110/69 98/67 97/71   Pulse: 77 76 72 76  Resp:  18 16 17   Temp:  97.8 F (36.6 C) 97.6 F (36.4 C) 97.6 F (36.4 C)  TempSrc:  Oral Oral   SpO2:  92% 95% 93%  Weight:      Height:        Wt Readings from Last 3 Encounters:  01/08/24 94.3 kg  12/01/23 101.7 kg  11/11/23 99.8 kg     Intake/Output Summary (Last 24 hours) at 01/14/2024 1258 Last data filed at 01/14/2024 1221 Gross per 24 hour  Intake --  Output 2125 ml  Net -2125 ml     Physical Exam  Awake Alert, Oriented X 3, frail, ill-appearing, tremors still present but improved Symmetrical Chest wall movement, Good air movement bilaterally, CTAB RRR,No Gallops,Rubs or new Murmurs, No Parasternal Heave +ve B.Sounds, Abd Soft, No tenderness, No rebound - guarding or rigidity. No Cyanosis, Clubbing or edema,  Pictures as of 5/28            Data Review:    Recent Labs  Lab 01/10/24 0418 01/11/24 0443 01/12/24 0424 01/13/24 0434 01/14/24 0603  WBC 5.1 4.8 4.6 4.6 5.6  HGB 12.5* 12.1* 12.1* 12.4* 12.2*  HCT 36.8* 36.1* 36.1* 36.0* 36.7*  PLT 84* 96* 139* 163 201  MCV 98.7 99.7 99.7 100.6* 101.4*  MCH 33.5 33.4 33.4 34.6* 33.7  MCHC 34.0 33.5 33.5 34.4 33.2  RDW 15.2 15.7* 15.9* 16.0* 16.0*  LYMPHSABS 1.6 1.5 1.6 1.6 2.1  MONOABS 0.7 0.7 0.6 0.6 0.9   EOSABS 0.0 0.1 0.1 0.1 0.1  BASOSABS 0.0 0.0 0.0 0.0 0.1    Recent Labs  Lab 01/08/24 0845 01/08/24 0845 01/09/24 0558 01/09/24 0601 01/10/24 0418 01/11/24 0443 01/12/24 0424 01/13/24 0434 01/14/24 0603  NA 130*  --   --  135 133* 135 133* 134* 134*  K 3.1*  --   --  3.3* 3.4* 4.0 4.0 3.8 3.9  CL 88*  --   --  97* 101 107 107 109 106  CO2 29  --   --  27 23 21* 21* 20* 20*  ANIONGAP 13  --   --  11 9 7 5 5 8   GLUCOSE 106*  --   --  88 105* 99 104* 118* 93  BUN 9  --   --  13 12 11 14 13 14   CREATININE 1.10  --   --  0.86 0.99 0.90 0.92 0.70 0.88  AST 61*  --   --  95* 164* 127* 105* 93* 99*  ALT 29  --   --  38 70* 70* 69* 64* 73*  ALKPHOS 84  --   --  78 83 80 89 85 76  BILITOT 3.9*  --   --  3.6* 2.7* 2.4* 1.9* 1.3* 1.0  ALBUMIN  3.4*  --   --  3.1* 3.1* 2.9* 2.9* 2.8* 2.8*  INR 1.1  --   --   --   --   --   --   --   --   AMMONIA  --    < >  --  45* 63* 35 40* 38* 17  BNP  --   --   --  54.3  --   --   --   --   --   MG 2.7*  --  2.1  --  1.4* 1.8 1.6* 1.8  --   PHOS  --   --  3.0  --  2.1* 5.2* 4.3 3.2  --   CALCIUM  8.0*  --   --  8.5* 8.8* 8.7* 8.9 8.6* 8.9   < > = values in this interval not displayed.      Recent Labs  Lab 01/08/24 0845 01/08/24 0845 01/09/24 1610 01/09/24 0601 01/10/24 0418 01/11/24 0443 01/12/24 0424 01/13/24 0434 01/14/24 0603  INR 1.1  --   --   --   --   --   --   --   --   AMMONIA  --    < >  --  45* 63* 35 40* 38* 17  BNP  --   --   --  54.3  --   --   --   --   --   MG 2.7*  --  2.1  --  1.4* 1.8 1.6* 1.8  --   CALCIUM  8.0*  --   --  8.5* 8.8*  8.7* 8.9 8.6* 8.9   < > = values in this interval not displayed.    --------------------------------------------------------------------------------------------------------------- Lab Results  Component Value Date   CHOL 159 03/02/2019   HDL 53 03/02/2019   LDLCALC 84 03/02/2019   TRIG 164 (H) 07/21/2020   CHOLHDL 3.0 03/02/2019    Lab Results  Component Value Date   HGBA1C  5.5 09/07/2021      Radiology Report No results found.    Signature  -   Seena Dadds M.D on 01/14/2024 at 12:58 PM   -  To page go to [email or web address]

## 2024-01-15 ENCOUNTER — Other Ambulatory Visit (HOSPITAL_COMMUNITY): Payer: Self-pay

## 2024-01-15 DIAGNOSIS — R569 Unspecified convulsions: Secondary | ICD-10-CM | POA: Diagnosis not present

## 2024-01-15 LAB — BASIC METABOLIC PANEL WITH GFR
Anion gap: 7 (ref 5–15)
BUN: 14 mg/dL (ref 6–20)
CO2: 21 mmol/L — ABNORMAL LOW (ref 22–32)
Calcium: 9 mg/dL (ref 8.9–10.3)
Chloride: 108 mmol/L (ref 98–111)
Creatinine, Ser: 0.83 mg/dL (ref 0.61–1.24)
GFR, Estimated: >60 mL/min (ref >60)
Glucose, Bld: 93 mg/dL (ref 70–99)
Potassium: 3.7 mmol/L (ref 3.5–5.1)
Sodium: 136 mmol/L (ref 135–145)

## 2024-01-15 LAB — CBC
HCT: 36.8 % — ABNORMAL LOW (ref 39.0–52.0)
Hemoglobin: 12.3 g/dL — ABNORMAL LOW (ref 13.0–17.0)
MCH: 33.6 pg (ref 26.0–34.0)
MCHC: 33.4 g/dL (ref 30.0–36.0)
MCV: 100.5 fL — ABNORMAL HIGH (ref 80.0–100.0)
Platelets: 238 10*3/uL (ref 150–400)
RBC: 3.66 MIL/uL — ABNORMAL LOW (ref 4.22–5.81)
RDW: 15.7 % — ABNORMAL HIGH (ref 11.5–15.5)
WBC: 5.8 10*3/uL (ref 4.0–10.5)
nRBC: 0 % (ref 0.0–0.2)

## 2024-01-15 LAB — MAGNESIUM: Magnesium: 1.5 mg/dL — ABNORMAL LOW (ref 1.7–2.4)

## 2024-01-15 LAB — PHOSPHORUS: Phosphorus: 4.6 mg/dL (ref 2.5–4.6)

## 2024-01-15 MED ORDER — MIDODRINE HCL 5 MG PO TABS
5.0000 mg | ORAL_TABLET | Freq: Three times a day (TID) | ORAL | 0 refills | Status: DC
  Filled 2024-01-15: qty 90, 30d supply, fill #0

## 2024-01-15 MED ORDER — PANTOPRAZOLE SODIUM 40 MG PO TBEC
40.0000 mg | DELAYED_RELEASE_TABLET | Freq: Every day | ORAL | 0 refills | Status: DC
  Filled 2024-01-15: qty 30, 30d supply, fill #0

## 2024-01-15 MED ORDER — ALLOPURINOL 100 MG PO TABS
100.0000 mg | ORAL_TABLET | Freq: Every day | ORAL | 0 refills | Status: DC
  Filled 2024-01-15: qty 30, 30d supply, fill #0

## 2024-01-15 MED ORDER — ADULT MULTIVITAMIN W/MINERALS CH
1.0000 | ORAL_TABLET | Freq: Every day | ORAL | Status: DC
Start: 2024-01-15 — End: 2024-03-07

## 2024-01-15 MED ORDER — LEVETIRACETAM 500 MG PO TABS
500.0000 mg | ORAL_TABLET | Freq: Two times a day (BID) | ORAL | 0 refills | Status: DC
  Filled 2024-01-15: qty 60, 30d supply, fill #0

## 2024-01-15 MED ORDER — VITAMIN B-1 100 MG PO TABS: 100.0000 mg | ORAL_TABLET | Freq: Every day | ORAL | Status: DC

## 2024-01-15 MED ORDER — NICOTINE 21 MG/24HR TD PT24: 21.0000 mg | MEDICATED_PATCH | Freq: Every day | TRANSDERMAL | Status: DC

## 2024-01-15 MED ORDER — CHLORDIAZEPOXIDE HCL 10 MG PO CAPS
ORAL_CAPSULE | ORAL | 0 refills | Status: DC
  Filled 2024-01-15: qty 15, 8d supply, fill #0

## 2024-01-15 MED ORDER — GABAPENTIN 300 MG PO CAPS
300.0000 mg | ORAL_CAPSULE | Freq: Two times a day (BID) | ORAL | 0 refills | Status: DC
  Filled 2024-01-15: qty 60, 30d supply, fill #0

## 2024-01-15 MED ORDER — LACTULOSE 20 GM/30ML PO SOLN
15.0000 mL | Freq: Three times a day (TID) | ORAL | 0 refills | Status: DC
Start: 2024-01-15 — End: 2024-03-10
  Filled 2024-01-15: qty 946, 21d supply, fill #0

## 2024-01-15 NOTE — Discharge Summary (Signed)
 Physician Discharge Summary  [name] NWG:[id number] DOB: 10/07/1970 DOA: 01/08/2024  PCP: Abraham Abo, MD  Admit date: 01/08/2024 Discharge date: 01/15/2024  Admitted From: (Home) Disposition:  (Home )  Recommendations for Outpatient Follow-up:  Follow up with PCP in 1-2 weeks Please obtain BMP/CBC in one week Continue counseling about alcohol  abuse.  Patient was given instructions about seizure precautions in details, he acknowledged understanding of those instructions  Do not drive, operating heavy machinery, perform activities at heights, swimming or participation in water  activities or provide baby sitting services due to seizures until you have seen by Primary MD or a Neurologist and advised to do so again.    Diet recommendation: Regular   Brief/Interim Summary:  53 y.o. male with medical history significant of etoh abuse, cirrhosis (portal HTN, esophageal varices, ascites), schizoaffective disorder, seizure disorder and other medical issues here after suspected seizure. He notes that he stopped drinking and a few days later "I think I had a seizure". "I stopped because I have cirrhosis, I can't be doing that." He notes that he was "on a bender" this month, drinking 5 twenty four oz 4 lokos a day. On further discussion, sounds like he had nausea/vomiting and abdominal discomfort that also contributed to his decision to stop drinking. He doesn't remember events around suspected seizure, but he woke up disoriented in a doorway. There was blood everywhere from his L eye laceration.  Patient was admitted for alcohol  withdrawal seizures, and alcohol  withdrawal, please see discussion below.   Complicated Alcohol  Withdrawal  Alcohol  Withdrawal Seizures Strictly counseled to quit alcohol , this is a recurrent problem, was treated with phenobarbital CIWA protocol, and scheduled Librium , he was tapered off phenobarbital, he will be discharged on Librium  taper, no evidence of withdrawals  at time of discharge, . continue with thiamine  and folic acid .   - Continue with home Keppra , he was given refill at time of discharge -He was counseled at length alcohol  and about seizure precaution   Traumatic Fall in the Setting of Suspected Etoh withdrawal seizure Left Periorbial Laceration (s/p repair with tissue adhesive, no sutures) CT head and maxillofacial no acute changes, PT eval, supportive care.   Abdominal Pain  Nausea  Vomiting Much improved after supportive care continue PPI and antiemetics, stable lipase   Alcoholic Hepatitis.  Fatty liver versus alcoholic cirrhosis.  Portal Hypertensive Gastropathy  Ascites  Chronic thrombocytopenia Maddrey discriminant function low, no need for steroids, ultrasound stable, continue to monitor with supportive care.  Resume Aldactone  upon discharge was dehydrated upon admission.  Outpatient [facility] follow-up postdischarge. - Left he is improving  Hypokalemia, hypomagnesemia, hypophosphatemia Replaced   Hypotension - Pressure has been soft during entire hospital stay, so his Toprol -XL and Aldactone  has been held during hospital stay, and was not resumed at time of discharge, he was started on midodrine  which he tolerated well, he will be kept on it on discharge, patient with overall soft blood pressure but he was able to ambulate in the hallway multiple times with no dizziness, lightheadedness . - The echo with a preserved EF, no regional wall motion abnormalities   Seizure Disorder Continue keppra  Breakthrough seizure in setting of etoh withdrawal above   Gout Allopurinol    Chronic Pain Gabapentin    Mood Disorder Schizoaffective Disorder Continue nightly seroquel  and zoloft    GERD.  On PPI.  Discharge Diagnoses:  Principal Problem:   Seizure [facility]) Active Problems:   Alcohol  abuse   Abnormal LFTs   Tobacco dependence due to cigarettes  Schizoaffective disorder, depressive type (HCC)   History of alcohol  abuse    Alcoholic hepatitis without ascites    Discharge Instructions  Discharge Instructions     Ambulatory referral to Physical Therapy   Complete by: As directed    Outpatient physical therapy evaluation and treatment   Diet - low sodium heart healthy   Complete by: As directed    Discharge instructions   Complete by: As directed    Do not drive, operating heavy machinery, perform activities at heights, swimming or participation in water  activities or provide baby sitting services due to seizures until you have seen by Primary MD or a Neurologist and advised to do so again.    Follow with Primary MD Abraham Abo, MD in 7 days   Get CBC, CMP,  checked  by Primary MD next visit.    Activity: As tolerated with Full fall precautions use walker/cane & assistance as needed   Disposition Home   Diet: Regular diet   On your next visit with your primary care physician please Get Medicines reviewed and adjusted.   Please request your Prim.MD to go over all Hospital Tests and Procedure/Radiological results at the follow up, please get all Hospital records sent to your Prim MD by signing hospital release before you go home.   If you experience worsening of your admission symptoms, develop shortness of breath, life threatening emergency, suicidal or homicidal thoughts you must seek medical attention immediately by calling 911 or calling your MD immediately  if symptoms less severe.  You Must read complete instructions/literature along with all the possible adverse reactions/side effects for all the Medicines you take and that have been prescribed to you. Take any new Medicines after you have completely understood and accpet all the possible adverse reactions/side effects.   Do not drive when taking Pain medications.    Do not take more than prescribed Pain, Sleep and Anxiety Medications  Special Instructions: If you have smoked or chewed Tobacco  in the last 2 yrs please stop smoking,  stop any regular Alcohol   and or any Recreational drug use.  Wear Seat belts while driving.   Please note  You were cared for by a hospitalist during your hospital stay. If you have any questions about your discharge medications or the care you received while you were in the hospital after you are discharged, you can call the unit and asked to speak with the hospitalist on call if the hospitalist that took care of you is not available. Once you are discharged, your primary care physician will handle any further medical issues. Please note that NO REFILLS for any discharge medications will be authorized once you are discharged, as it is imperative that you return to your primary care physician (or establish a relationship with a primary care physician if you do not have one) for your aftercare needs so that they can reassess your need for medications and monitor your lab values.   Increase activity slowly   Complete by: As directed    No wound care   Complete by: As directed       Allergies as of 01/15/2024       Reactions   Bupropion Other (See Comments)   Caused seizures Caused seizures Feel as though I will have a seizure when taking this med        Medication List     STOP taking these medications    metoprolol  succinate 25 MG 24 hr tablet Commonly known  as: TOPROL -XL   naproxen  sodium 220 MG tablet Commonly known as: ALEVE    spironolactone  100 MG tablet Commonly known as: ALDACTONE        TAKE these medications    allopurinol  100 MG tablet Commonly known as: ZYLOPRIM  Take 1 tablet (100 mg total) by mouth daily.   chlordiazePOXIDE  10 MG capsule Commonly known as: LIBRIUM  Take 10 mg oral 3 times daily x 2 days, then 10 mg oral twice daily x 3 days, then 10 mg oral daily x 3 days then stop.   gabapentin  300 MG capsule Commonly known as: NEURONTIN  Take 1 capsule (300 mg total) by mouth 2 (two) times daily.   Lactulose  20 GM/30ML Soln Take 15 mLs (10 g total) by  mouth 3 (three) times daily.   levETIRAcetam  500 MG tablet Commonly known as: KEPPRA  Take 1 tablet (500 mg total) by mouth 2 (two) times daily.   midodrine  5 MG tablet Commonly known as: PROAMATINE  Take 1 tablet (5 mg total) by mouth 3 (three) times daily with meals.   multivitamin with minerals Tabs tablet Take 1 tablet by mouth daily.   nicotine  21 mg/24hr patch Commonly known as: NICODERM CQ  - dosed in mg/24 hours Place 1 patch (21 mg total) onto the skin daily.   pantoprazole  40 MG tablet Commonly known as: Protonix  Take 1 tablet (40 mg total) by mouth daily.   QUEtiapine  200 MG tablet Commonly known as: SEROQUEL  Take 1 tablet (200 mg total) by mouth at bedtime.   sertraline  25 MG tablet Commonly known as: ZOLOFT  Take 1 tablet (25 mg total) by mouth daily.   thiamine  100 MG tablet Commonly known as: Vitamin B-1 Take 1 tablet (100 mg total) by mouth daily.               Durable Medical Equipment  (From admission, onward)           Start     Ordered   01/13/24 1522  For home use only DME Walker rolling  Once       Question Answer Comment  Walker: With 5 Inch Wheels   Patient needs a walker to treat with the following condition Unstable gait      01/13/24 1521            Follow-up Information     [facility] Follow up.   Specialty: Rehabilitation Why: Outpatient Physical Therapy-office to call with visit times within 3-5 business days. IF the office has not called; please call them. Contact information: [address] [place]   [phone]               Allergies  Allergen Reactions   Bupropion Other (See Comments)    Caused seizures Caused seizures Feel as though I will have a seizure when taking this med    Consultations: None   Procedures/Studies: ECHOCARDIOGRAM COMPLETE Result Date: 01/14/2024    ECHOCARDIOGRAM REPORT   Patient Name:    HANSEN CARINO Date of Exam: 01/14/2024 Medical Rec #:  [id number]       Height:       72.0 in Accession #:    [id number]      Weight:       207.8 lb Date of Birth:  05-03-1971        BSA:          2.165 m Patient Age:    53 years        BP:  76/45 mmHg Patient Gender: M               HR:           74 bpm. Exam Location:  Inpatient Procedure: 2D Echo, Cardiac Doppler and Color Doppler (Both Spectral and Color            Flow Doppler were utilized during procedure). Indications:    Hypotension  History:        Patient has prior history of Echocardiogram examinations, most                 recent 10/06/2019.  Sonographer:    Janette Medley Referring Phys: 61 Adriana Quinby S Margaretann Abate IMPRESSIONS  1. Left ventricular ejection fraction, by estimation, is 55 to 60%. The left ventricle has normal function. The left ventricle has no regional wall motion abnormalities. Left ventricular diastolic parameters were normal.  2. Right ventricular systolic function is normal. The right ventricular size is normal. Tricuspid regurgitation signal is inadequate for assessing PA pressure.  3. The mitral valve is normal in structure. No evidence of mitral valve regurgitation. No evidence of mitral stenosis.  4. The aortic valve is tricuspid. Aortic valve regurgitation is not visualized. Aortic valve sclerosis/calcification is present, without any evidence of aortic stenosis.  5. The inferior vena cava is normal in size with greater than 50% respiratory variability, suggesting right atrial pressure of 3 mmHg. FINDINGS  Left Ventricle: Left ventricular ejection fraction, by estimation, is 55 to 60%. The left ventricle has normal function. The left ventricle has no regional wall motion abnormalities. The left ventricular internal cavity size was normal in size. There is  no left ventricular hypertrophy. Left ventricular diastolic parameters were normal. Normal left ventricular filling pressure. Right Ventricle: The right ventricular size is  normal. No increase in right ventricular wall thickness. Right ventricular systolic function is normal. Tricuspid regurgitation signal is inadequate for assessing PA pressure. Left Atrium: Left atrial size was normal in size. Right Atrium: Right atrial size was normal in size. Pericardium: There is no evidence of pericardial effusion. Mitral Valve: The mitral valve is normal in structure. No evidence of mitral valve regurgitation. No evidence of mitral valve stenosis. Tricuspid Valve: The tricuspid valve is normal in structure. Tricuspid valve regurgitation is not demonstrated. No evidence of tricuspid stenosis. Aortic Valve: The aortic valve is tricuspid. Aortic valve regurgitation is not visualized. Aortic valve sclerosis/calcification is present, without any evidence of aortic stenosis. Pulmonic Valve: The pulmonic valve was normal in structure. Pulmonic valve regurgitation is not visualized. No evidence of pulmonic stenosis. Aorta: The aortic root is normal in size and structure. Venous: The inferior vena cava is normal in size with greater than 50% respiratory variability, suggesting right atrial pressure of 3 mmHg. IAS/Shunts: No atrial level shunt detected by color flow Doppler.  LEFT VENTRICLE PLAX 2D LVIDd:         4.90 cm   Diastology LVIDs:         3.00 cm   LV e' medial:    9.36 cm/s LV PW:         1.10 cm   LV E/e' medial:  8.6 LV IVS:        1.00 cm   LV e' lateral:   10.70 cm/s LVOT diam:     2.40 cm   LV E/e' lateral: 7.5 LV SV:         90 LV SV Index:   42 LVOT Area:     4.52  cm  RIGHT VENTRICLE             IVC RV S prime:     11.10 cm/s  IVC diam: 1.20 cm TAPSE (M-mode): 2.1 cm LEFT ATRIUM             Index        RIGHT ATRIUM           Index LA diam:        3.30 cm 1.52 cm/m   RA Area:     17.20 cm LA Vol (A2C):   40.2 ml 18.56 ml/m  RA Volume:   47.50 ml  21.94 ml/m LA Vol (A4C):   25.8 ml 11.91 ml/m LA Biplane Vol: 32.2 ml 14.87 ml/m  AORTIC VALVE LVOT Vmax:   99.00 cm/s LVOT Vmean:   65.000 cm/s LVOT VTI:    0.200 m  AORTA Ao Root diam: 2.80 cm Ao Asc diam:  3.00 cm MITRAL VALVE MV Area (PHT): 3.85 cm    SHUNTS MV Decel Time: 197 msec    Systemic VTI:  0.20 m MV E velocity: 80.20 cm/s  Systemic Diam: 2.40 cm MV A velocity: 68.70 cm/s MV E/A ratio:  1.17 Gaylyn Keas MD Electronically signed by Gaylyn Keas MD Signature Date/Time: 01/14/2024/4:48:36 PM    Final    US  ASCITES (ABDOMEN LIMITED) Result Date: 01/08/2024 CLINICAL DATA:  Abdominal pain EXAM: LIMITED ABDOMEN ULTRASOUND FOR ASCITES TECHNIQUE: Limited ultrasound survey for ascites was performed in all four abdominal quadrants. COMPARISON:  None Available. FINDINGS: No significant ascites in the 4 quadrants of the abdomen by ultrasound. IMPRESSION: No significant ascites seen at this time. Electronically Signed   By: Adrianna Horde M.D.   On: 01/08/2024 13:53   CT MAXILLOFACIAL WO CONTRAST Result Date: 01/08/2024 CLINICAL DATA:  Facial trauma EXAM: CT MAXILLOFACIAL WITHOUT CONTRAST TECHNIQUE: Multidetector CT imaging of the maxillofacial structures was performed. Multiplanar CT image reconstructions were also generated. RADIATION DOSE REDUCTION: This exam was performed according to the departmental dose-optimization program which includes automated exposure control, adjustment of the mA and/or kV according to patient size and/or use of iterative reconstruction technique. COMPARISON:  Same-day head CT. FINDINGS: Osseous: Maxilla: Intact.  Periapical lucencies of left maxillary molars. Pterygoid Plates: Intact Zygomatic Arch: Intact Orbits: Intact Ethmoid: Intact Sphenoid: Intact Frontal:Intact Mandible: Intact. No condylar dislocation. Periapical lucencies of left mandibular molars. Dental caries. Nasal: Intact Nasal Septum: Intact. Rightward bowing of the nasal septum without mass effect on the turbinates. Orbits: The globes are intact. Lenses are normally located. Normal appearance of the extraocular muscles and optic nerve sheath  complexes. Intraorbital fat is unremarkable. There is a small focus of soft tissue swelling along the lateral aspect of the left orbit with partial extension into the superolateral preseptal soft tissues. Sinuses: Mucosal thickening in the alveolar recesses of both maxillary sinuses which may be odontogenic in origin. Additional small mucous retention cyst in the right maxillary sinus. No air-fluid levels. Soft tissues: Left periorbital soft tissue swelling. Limited intracranial: No significant or unexpected finding. IMPRESSION: No acute maxillofacial fracture. Left periorbital soft tissue swelling as above. Dental caries.  Multiple periapical lucencies. Electronically Signed   By: Denny Flack M.D.   On: 01/08/2024 13:06   DG Shoulder Left Result Date: 01/08/2024 CLINICAL DATA:  [id number] Abdominal pain [id number].  Seizures. EXAM: LEFT SHOULDER - 2+ VIEW COMPARISON:  None Available. FINDINGS: No acute fracture. No aggressive osseous lesion. Glenohumeral and acromioclavicular joints alignment is within normal limits. No significant degenerative  changes. No soft tissue swelling. No radiopaque foreign bodies. IMPRESSION: No acute osseous abnormality of the left shoulder. Electronically Signed   By: Beula Brunswick M.D.   On: 01/08/2024 10:52   DG CHEST PORT 1 VIEW Result Date: 01/08/2024 CLINICAL DATA:  [id number] Abdominal pain [id number] EXAM: PORTABLE CHEST 1 VIEW COMPARISON:  11/12/2023. FINDINGS: Low lung volume. Bilateral lung fields are clear. Bilateral costophrenic angles are clear. Stable cardio-mediastinal silhouette. No acute osseous abnormalities. The soft tissues are within normal limits. No free air under the domes of diaphragm. IMPRESSION: No active disease. Electronically Signed   By: Beula Brunswick M.D.   On: 01/08/2024 10:51   US  Abdomen Limited RUQ (LIVER/GB) Result Date: 01/08/2024 CLINICAL DATA:  [id number] Abdominal pain [id number]. EXAM: ULTRASOUND ABDOMEN LIMITED RIGHT UPPER QUADRANT COMPARISON:  None  Available. FINDINGS: Gallbladder: Physiologically distended. Small-to-moderate volume sludge noted. No gallstones. No abnormal wall thickening or pericholecystic free fluid. Sonographic Murphy sign was negative as per the technologist. Common bile duct: Diameter: Up to 3.7 mm.  No intrahepatic bile duct dilation. Liver: There is poor sound beam penetration to the deep / posterior aspects of the liver as a result of increased hepatic echogenicity which reduces the sensitivity of ultrasound for the detection of focal masses. That being said, no focal mass is identified. Portal vein is patent on color Doppler imaging with normal direction of blood flow towards the liver. Other: None. IMPRESSION: *Small-to-moderate volume gallbladder sludge without sonographic evidence of acute cholecystitis. *Increased hepatic echogenicity, a nonspecific finding that is most commonly seen on the basis of steatosis in the absence of known liver disease. Electronically Signed   By: Beula Brunswick M.D.   On: 01/08/2024 10:30   CT Head Wo Contrast Result Date: 01/08/2024 CLINICAL DATA:  Moderate to severe head trauma, seizures EXAM: CT HEAD WITHOUT CONTRAST TECHNIQUE: Contiguous axial images were obtained from the base of the skull through the vertex without intravenous contrast. RADIATION DOSE REDUCTION: This exam was performed according to the departmental dose-optimization program which includes automated exposure control, adjustment of the mA and/or kV according to patient size and/or use of iterative reconstruction technique. COMPARISON:  11/11/2023 FINDINGS: Brain: No evidence of acute infarction, hemorrhage, hydrocephalus, extra-axial collection or mass lesion/mass effect. Brain atrophy especially affecting the cerebellum Vascular: No hyperdense vessel or unexpected calcification. Skull: Normal. Negative for fracture or focal lesion. Sinuses/Orbits: No evidence of injury IMPRESSION: No acute finding. Electronically Signed   By:  Ronnette Coke M.D.   On: 01/08/2024 07:08     Subjective: No significant events overnight, he denies any complaints today,  Discharge Exam: Vitals:   01/15/24 0010 01/15/24 0400  BP: 98/61 91/60  Pulse: 75 (!) 54  Resp: 15 13  Temp: 97.9 F (36.6 C) 98.1 F (36.7 C)  SpO2:  95%   Vitals:   01/14/24 0817 01/14/24 2029 01/15/24 0010 01/15/24 0400  BP: 97/71 97/65 98/61  91/60  Pulse: 76  75 (!) 54  Resp: 17 19 15 13   Temp: 97.6 F (36.4 C)  97.9 F (36.6 C) 98.1 F (36.7 C)  TempSrc:   Oral Oral  SpO2: 93%   95%  Weight:      Height:        General: Pt is alert, awake, not in acute distress Cardiovascular: RRR, S1/S2 +, no rubs, no gallops Respiratory: CTA bilaterally, no wheezing, no rhonchi Abdominal: Soft, NT, ND, bowel sounds + Extremities: no edema, no cyanosis    The results of significant diagnostics from this  hospitalization (including imaging, microbiology, ancillary and laboratory) are listed below for reference.     Microbiology: No results found for this or any previous visit (from the past 240 hours).   Labs: BNP (last 3 results) Recent Labs    11/13/23 0531 11/14/23 0515 01/09/24 0601  BNP 52.0 89.7 54.3   Basic Metabolic Panel: Recent Labs  Lab 01/10/24 0418 01/11/24 0443 01/12/24 0424 01/13/24 0434 01/14/24 0603 01/15/24 0444  NA 133* 135 133* 134* 134* 136  K 3.4* 4.0 4.0 3.8 3.9 3.7  CL 101 107 107 109 106 108  CO2 23 21* 21* 20* 20* 21*  GLUCOSE 105* 99 104* 118* 93 93  BUN 12 11 14 13 14 14   CREATININE 0.99 0.90 0.92 0.70 0.88 0.83  CALCIUM  8.8* 8.7* 8.9 8.6* 8.9 9.0  MG 1.4* 1.8 1.6* 1.8  --  1.5*  PHOS 2.1* 5.2* 4.3 3.2  --  4.6   Liver Function Tests: Recent Labs  Lab 01/10/24 0418 01/11/24 0443 01/12/24 0424 01/13/24 0434 01/14/24 0603  AST 164* 127* 105* 93* 99*  ALT 70* 70* 69* 64* 73*  ALKPHOS 83 80 89 85 76  BILITOT 2.7* 2.4* 1.9* 1.3* 1.0  PROT 5.8* 5.6* 5.7* 5.7* 5.7*  ALBUMIN  3.1* 2.9* 2.9* 2.8*  2.8*   No results for input(s): "LIPASE", "AMYLASE" in the last 168 hours. Recent Labs  Lab 01/10/24 0418 01/11/24 0443 01/12/24 0424 01/13/24 0434 01/14/24 0603  AMMONIA 63* 35 40* 38* 17   CBC: Recent Labs  Lab 01/10/24 0418 01/11/24 0443 01/12/24 0424 01/13/24 0434 01/14/24 0603 01/15/24 0444  WBC 5.1 4.8 4.6 4.6 5.6 5.8  NEUTROABS 2.7 2.5 2.3 2.3 2.3  --   HGB 12.5* 12.1* 12.1* 12.4* 12.2* 12.3*  HCT 36.8* 36.1* 36.1* 36.0* 36.7* 36.8*  MCV 98.7 99.7 99.7 100.6* 101.4* 100.5*  PLT 84* 96* 139* 163 201 238   Cardiac Enzymes: No results for input(s): "CKTOTAL", "CKMB", "CKMBINDEX", "TROPONINI" in the last 168 hours. BNP: Invalid input(s): "POCBNP" CBG: No results for input(s): "GLUCAP" in the last 168 hours. D-Dimer No results for input(s): "DDIMER" in the last 72 hours. Hgb A1c No results for input(s): "HGBA1C" in the last 72 hours. Lipid Profile No results for input(s): "CHOL", "HDL", "LDLCALC", "TRIG", "CHOLHDL", "LDLDIRECT" in the last 72 hours. Thyroid function studies No results for input(s): "TSH", "T4TOTAL", "T3FREE", "THYROIDAB" in the last 72 hours.  Invalid input(s): "FREET3" Anemia work up No results for input(s): "VITAMINB12", "FOLATE", "FERRITIN", "TIBC", "IRON", "RETICCTPCT" in the last 72 hours. Urinalysis    Component Value Date/Time   COLORURINE YELLOW 01/08/2024 0347   APPEARANCEUR CLEAR 01/08/2024 0347   LABSPEC 1.004 (L) 01/08/2024 0347   PHURINE 6.0 01/08/2024 0347   GLUCOSEU NEGATIVE 01/08/2024 0347   HGBUR NEGATIVE 01/08/2024 0347   BILIRUBINUR NEGATIVE 01/08/2024 0347   KETONESUR NEGATIVE 01/08/2024 0347   PROTEINUR NEGATIVE 01/08/2024 0347   UROBILINOGEN 1.0 12/24/2014 0643   NITRITE NEGATIVE 01/08/2024 0347   LEUKOCYTESUR NEGATIVE 01/08/2024 0347   Sepsis Labs Recent Labs  Lab 01/12/24 0424 01/13/24 0434 01/14/24 0603 01/15/24 0444  WBC 4.6 4.6 5.6 5.8   Microbiology No results found for this or any previous visit  (from the past 240 hours).   Time coordinating discharge: Over 30 minutes  SIGNED:   Seena Dadds, MD  [facility] 01/15/2024, 9:29 AM Pager   If 7PM-7AM, please contact night-coverage [email or web address] Password [email or web address]

## 2024-01-15 NOTE — Plan of Care (Signed)

## 2024-01-15 NOTE — Progress Notes (Signed)
   01/15/24 0940  Mobility  Activity Ambulated independently in hallway  Level of Assistance Modified independent, requires aide device or extra time  Assistive Device None  Distance Ambulated (ft) 300 ft  Activity Response Tolerated well  Mobility Referral Yes  Mobility visit 1 Mobility  Mobility Specialist Start Time (ACUTE ONLY) 0940  Mobility Specialist Stop Time (ACUTE ONLY) 1010  Mobility Specialist Time Calculation (min) (ACUTE ONLY) 30 min   Mobility Specialist: Progress Note  Pt agreeable to mobility session - received in bed. Pt was asymptomatic throughout session with no complaints. Returned to bed with all needs met - call bell within reach.   [name], BS Mobility Specialist Please contact via SecureChat or  Rehab office at [phone].

## 2024-01-18 ENCOUNTER — Telehealth: Payer: Self-pay

## 2024-01-18 NOTE — Transitions of Care (Post Inpatient/ED Visit) (Signed)
   01/18/2024  Name: [name] MRN: [id number] DOB: [birth year]  Today's TOC FU Call Status: Today's TOC FU Call Status:: Successful TOC FU Call Completed TOC FU Call Complete Date: 01/18/24 Patient's Name and Date of Birth confirmed.  Transition Care Management Follow-up Telephone Call Date of Discharge: 01/15/24 Discharge Facility: [facility]) Type of Discharge: Inpatient Admission Primary Inpatient Discharge Diagnosis:: seizure How have you been since you were released from the hospital?: Better (Overall he stated he is doing better, he just feels shaky.) Any questions or concerns?: No  Items Reviewed: Did you receive and understand the discharge instructions provided?: Yes Medications obtained,verified, and reconciled?: No Medications Not Reviewed Reasons:: Other: (He said he has all of the medications except the seroquel  and zoloft  and he will get those tomorrow. He stated he is not home and did not have the med list with him but he did not have any questions at this time.) Any new allergies since your discharge?: No Dietary orders reviewed?: Yes Type of Diet Ordered:: heart healthy, low sodium. He said he is not following that diet and does not see the point of following it. Do you have support at home?: Yes Name of Support/Comfort Primary Source: He did not specify who  Medications Reviewed Today: Medications Reviewed Today   Medications were not reviewed in this encounter     Home Care and Equipment/Supplies: Were Home Health Services Ordered?: No Any new equipment or medical supplies ordered?: Yes Name of Medical supply agency?: [employer] Were you able to get the equipment/medical supplies?: Yes Do you have any questions related to the use of the equipment/supplies?: No  Functional Questionnaire: Do you need assistance with bathing/showering or dressing?: No Do you need assistance with meal preparation?: No Do you need assistance with eating?: No Do you have  difficulty maintaining continence: No Do you need assistance with getting out of bed/getting out of a chair/moving?: Yes (has RW) Do you have difficulty managing or taking your medications?: No  Follow up appointments reviewed: PCP Follow-up appointment confirmed?: Yes Date of PCP follow-up appointment?: 01/28/24 Follow-up Provider: Lavona Pounds, NP.   I mailed an appointment reminder to him as he requested. Specialist Hospital Follow-up appointment confirmed?: No Reason Specialist Follow-Up Not Confirmed: Patient has Specialist Provider Number and will Call for Appointment (the patient has a referral to outpatient PT and has the phone number for outpatient rehab on his AVS if he has not heard from anyone in a week.) Do you need transportation to your follow-up appointment?: No (I reminded him that he is not supposed to be driving and he said he knows.) Do you understand care options if your condition(s) worsen?: Yes-patient verbalized understanding    SIGNATURE Burnett Carson, RN

## 2024-01-28 ENCOUNTER — Encounter: Payer: Self-pay | Admitting: Family

## 2024-01-28 ENCOUNTER — Ambulatory Visit (INDEPENDENT_AMBULATORY_CARE_PROVIDER_SITE_OTHER): Admitting: Family

## 2024-01-28 VITALS — BP 123/78 | HR 81 | Temp 97.9°F | Resp 18 | Ht 72.0 in | Wt 236.0 lb

## 2024-01-28 DIAGNOSIS — R569 Unspecified convulsions: Secondary | ICD-10-CM

## 2024-01-28 DIAGNOSIS — K7010 Alcoholic hepatitis without ascites: Secondary | ICD-10-CM

## 2024-01-28 DIAGNOSIS — F1010 Alcohol abuse, uncomplicated: Secondary | ICD-10-CM | POA: Diagnosis not present

## 2024-01-28 DIAGNOSIS — M109 Gout, unspecified: Secondary | ICD-10-CM

## 2024-01-28 DIAGNOSIS — F1011 Alcohol abuse, in remission: Secondary | ICD-10-CM | POA: Diagnosis not present

## 2024-01-28 DIAGNOSIS — I959 Hypotension, unspecified: Secondary | ICD-10-CM

## 2024-01-28 DIAGNOSIS — R7989 Other specified abnormal findings of blood chemistry: Secondary | ICD-10-CM

## 2024-01-28 DIAGNOSIS — G894 Chronic pain syndrome: Secondary | ICD-10-CM

## 2024-01-28 DIAGNOSIS — Z09 Encounter for follow-up examination after completed treatment for conditions other than malignant neoplasm: Secondary | ICD-10-CM

## 2024-01-28 DIAGNOSIS — E876 Hypokalemia: Secondary | ICD-10-CM | POA: Diagnosis not present

## 2024-01-28 DIAGNOSIS — G40909 Epilepsy, unspecified, not intractable, without status epilepticus: Secondary | ICD-10-CM

## 2024-01-28 DIAGNOSIS — F39 Unspecified mood [affective] disorder: Secondary | ICD-10-CM

## 2024-01-28 DIAGNOSIS — R609 Edema, unspecified: Secondary | ICD-10-CM

## 2024-01-28 DIAGNOSIS — F17210 Nicotine dependence, cigarettes, uncomplicated: Secondary | ICD-10-CM

## 2024-01-28 DIAGNOSIS — F259 Schizoaffective disorder, unspecified: Secondary | ICD-10-CM

## 2024-01-28 DIAGNOSIS — K219 Gastro-esophageal reflux disease without esophagitis: Secondary | ICD-10-CM

## 2024-01-28 MED ORDER — FUROSEMIDE 20 MG PO TABS: 20.0000 mg | ORAL_TABLET | Freq: Every day | ORAL | 0 refills | Status: DC
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
## 2024-01-28 NOTE — Progress Notes (Signed)
 Hospital follow up, seizures, legs and feet are swollen.  They took patient off his b/p medication because b/p was low

## 2024-01-29 ENCOUNTER — Ambulatory Visit: Payer: Self-pay | Admitting: Family

## 2024-01-29 LAB — CBC
Hematocrit: 38 % (ref 37.5–51.0)
Hemoglobin: 12.3 g/dL — ABNORMAL LOW (ref 13.0–17.7)
MCH: 33.7 pg — ABNORMAL HIGH (ref 26.6–33.0)
MCHC: 32.4 g/dL (ref 31.5–35.7)
MCV: 104 fL — ABNORMAL HIGH (ref 79–97)
Platelets: 231 10*3/uL (ref 150–450)
RBC: 3.65 x10E6/uL — ABNORMAL LOW (ref 4.14–5.80)
RDW: 14 % (ref 11.6–15.4)
WBC: 7.6 10*3/uL (ref 3.4–10.8)

## 2024-01-29 LAB — BASIC METABOLIC PANEL WITH GFR
BUN/Creatinine Ratio: 13 (ref 9–20)
BUN: 14 mg/dL (ref 6–24)
CO2: 19 mmol/L — ABNORMAL LOW (ref 20–29)
Calcium: 9.1 mg/dL (ref 8.7–10.2)
Chloride: 103 mmol/L (ref 96–106)
Creatinine, Ser: 1.11 mg/dL (ref 0.76–1.27)
Glucose: 67 mg/dL — ABNORMAL LOW (ref 70–99)
Potassium: 4.3 mmol/L (ref 3.5–5.2)
Sodium: 140 mmol/L (ref 134–144)
eGFR: 80 mL/min/{1.73_m2} (ref >59)

## 2024-02-10 ENCOUNTER — Ambulatory Visit (HOSPITAL_COMMUNITY): Attending: Family Medicine

## 2024-03-01 ENCOUNTER — Ambulatory Visit: Admitting: Family Medicine

## 2024-03-01 ENCOUNTER — Encounter: Payer: Self-pay | Admitting: Family Medicine

## 2024-03-06 ENCOUNTER — Inpatient Hospital Stay (HOSPITAL_COMMUNITY)
Admission: EM | Admit: 2024-03-06 | Discharge: 2024-03-10 | DRG: 897 | Disposition: A | Discharge status: Home / Self Care | Attending: Internal Medicine | Admitting: Internal Medicine

## 2024-03-06 ENCOUNTER — Encounter (HOSPITAL_COMMUNITY): Payer: Self-pay | Admitting: Internal Medicine

## 2024-03-06 ENCOUNTER — Other Ambulatory Visit: Payer: Self-pay

## 2024-03-06 ENCOUNTER — Emergency Department (HOSPITAL_COMMUNITY)

## 2024-03-06 DIAGNOSIS — F4310 Post-traumatic stress disorder, unspecified: Secondary | ICD-10-CM | POA: Diagnosis present

## 2024-03-06 DIAGNOSIS — E876 Hypokalemia: Secondary | ICD-10-CM | POA: Diagnosis not present

## 2024-03-06 DIAGNOSIS — D6959 Other secondary thrombocytopenia: Secondary | ICD-10-CM | POA: Diagnosis present

## 2024-03-06 DIAGNOSIS — F10239 Alcohol dependence with withdrawal, unspecified: Principal | ICD-10-CM | POA: Diagnosis present

## 2024-03-06 DIAGNOSIS — I10 Essential (primary) hypertension: Secondary | ICD-10-CM | POA: Diagnosis not present

## 2024-03-06 DIAGNOSIS — Z8249 Family history of ischemic heart disease and other diseases of the circulatory system: Secondary | ICD-10-CM

## 2024-03-06 DIAGNOSIS — K7030 Alcoholic cirrhosis of liver without ascites: Secondary | ICD-10-CM | POA: Diagnosis present

## 2024-03-06 DIAGNOSIS — G629 Polyneuropathy, unspecified: Secondary | ICD-10-CM | POA: Diagnosis not present

## 2024-03-06 DIAGNOSIS — F419 Anxiety disorder, unspecified: Secondary | ICD-10-CM | POA: Diagnosis present

## 2024-03-06 DIAGNOSIS — M549 Dorsalgia, unspecified: Secondary | ICD-10-CM | POA: Diagnosis not present

## 2024-03-06 DIAGNOSIS — F17210 Nicotine dependence, cigarettes, uncomplicated: Secondary | ICD-10-CM | POA: Diagnosis present

## 2024-03-06 DIAGNOSIS — R079 Chest pain, unspecified: Secondary | ICD-10-CM | POA: Diagnosis not present

## 2024-03-06 DIAGNOSIS — M109 Gout, unspecified: Secondary | ICD-10-CM | POA: Diagnosis not present

## 2024-03-06 DIAGNOSIS — R6889 Other general symptoms and signs: Secondary | ICD-10-CM | POA: Diagnosis not present

## 2024-03-06 DIAGNOSIS — S2231XA Fracture of one rib, right side, initial encounter for closed fracture: Secondary | ICD-10-CM | POA: Diagnosis not present

## 2024-03-06 DIAGNOSIS — E871 Hypo-osmolality and hyponatremia: Secondary | ICD-10-CM | POA: Diagnosis not present

## 2024-03-06 DIAGNOSIS — F259 Schizoaffective disorder, unspecified: Secondary | ICD-10-CM | POA: Diagnosis present

## 2024-03-06 DIAGNOSIS — Z743 Need for continuous supervision: Secondary | ICD-10-CM | POA: Diagnosis not present

## 2024-03-06 DIAGNOSIS — K219 Gastro-esophageal reflux disease without esophagitis: Secondary | ICD-10-CM | POA: Diagnosis not present

## 2024-03-06 DIAGNOSIS — R404 Transient alteration of awareness: Secondary | ICD-10-CM | POA: Diagnosis not present

## 2024-03-06 DIAGNOSIS — Z888 Allergy status to other drugs, medicaments and biological substances status: Secondary | ICD-10-CM

## 2024-03-06 DIAGNOSIS — F10232 Alcohol dependence with withdrawal with perceptual disturbance: Secondary | ICD-10-CM | POA: Diagnosis not present

## 2024-03-06 DIAGNOSIS — I499 Cardiac arrhythmia, unspecified: Secondary | ICD-10-CM | POA: Diagnosis not present

## 2024-03-06 DIAGNOSIS — F10939 Alcohol use, unspecified with withdrawal, unspecified: Secondary | ICD-10-CM

## 2024-03-06 DIAGNOSIS — K7460 Unspecified cirrhosis of liver: Secondary | ICD-10-CM | POA: Diagnosis present

## 2024-03-06 DIAGNOSIS — G40509 Epileptic seizures related to external causes, not intractable, without status epilepticus: Secondary | ICD-10-CM | POA: Diagnosis present

## 2024-03-06 DIAGNOSIS — Z79899 Other long term (current) drug therapy: Secondary | ICD-10-CM | POA: Diagnosis not present

## 2024-03-06 DIAGNOSIS — G8929 Other chronic pain: Secondary | ICD-10-CM | POA: Diagnosis present

## 2024-03-06 DIAGNOSIS — Z818 Family history of other mental and behavioral disorders: Secondary | ICD-10-CM

## 2024-03-06 DIAGNOSIS — Z811 Family history of alcohol abuse and dependence: Secondary | ICD-10-CM

## 2024-03-06 DIAGNOSIS — R569 Unspecified convulsions: Principal | ICD-10-CM

## 2024-03-06 DIAGNOSIS — Z6372 Alcoholism and drug addiction in family: Secondary | ICD-10-CM

## 2024-03-06 DIAGNOSIS — G40909 Epilepsy, unspecified, not intractable, without status epilepticus: Secondary | ICD-10-CM | POA: Diagnosis not present

## 2024-03-06 DIAGNOSIS — F10930 Alcohol use, unspecified with withdrawal, uncomplicated: Secondary | ICD-10-CM | POA: Diagnosis not present

## 2024-03-06 DIAGNOSIS — S0990XA Unspecified injury of head, initial encounter: Secondary | ICD-10-CM | POA: Diagnosis not present

## 2024-03-06 HISTORY — DX: Epilepsy, unspecified, not intractable, without status epilepticus: G40.909

## 2024-03-06 LAB — COMPREHENSIVE METABOLIC PANEL WITH GFR
ALT: 20 U/L (ref 0–44)
AST: 61 U/L — ABNORMAL HIGH (ref 15–41)
Albumin: 4 g/dL (ref 3.5–5.0)
Alkaline Phosphatase: 108 U/L (ref 38–126)
Anion gap: 20 — ABNORMAL HIGH (ref 5–15)
BUN: 12 mg/dL (ref 6–20)
CO2: 27 mmol/L (ref 22–32)
Calcium: 9 mg/dL (ref 8.9–10.3)
Chloride: 85 mmol/L — ABNORMAL LOW (ref 98–111)
Creatinine, Ser: 0.92 mg/dL (ref 0.61–1.24)
GFR, Estimated: >60 mL/min (ref >60)
Glucose, Bld: 127 mg/dL — ABNORMAL HIGH (ref 70–99)
Potassium: 2.4 mmol/L — CL (ref 3.5–5.1)
Sodium: 132 mmol/L — ABNORMAL LOW (ref 135–145)
Total Bilirubin: 4.3 mg/dL — ABNORMAL HIGH (ref 0.0–1.2)
Total Protein: 7.1 g/dL (ref 6.5–8.1)

## 2024-03-06 LAB — CBC WITH DIFFERENTIAL/PLATELET
Abs Immature Granulocytes: 0.02 K/uL (ref 0.00–0.07)
Basophils Absolute: 0 K/uL (ref 0.0–0.1)
Basophils Relative: 0 %
Eosinophils Absolute: 0 K/uL (ref 0.0–0.5)
Eosinophils Relative: 0 %
HCT: 43.2 % (ref 39.0–52.0)
Hemoglobin: 15 g/dL (ref 13.0–17.0)
Immature Granulocytes: 0 %
Lymphocytes Relative: 16 %
Lymphs Abs: 1 K/uL (ref 0.7–4.0)
MCH: 32.8 pg (ref 26.0–34.0)
MCHC: 34.7 g/dL (ref 30.0–36.0)
MCV: 94.3 fL (ref 80.0–100.0)
Monocytes Absolute: 0.6 K/uL (ref 0.1–1.0)
Monocytes Relative: 9 %
Neutro Abs: 5 K/uL (ref 1.7–7.7)
Neutrophils Relative %: 75 %
Platelets: 75 K/uL — ABNORMAL LOW (ref 150–400)
RBC: 4.58 MIL/uL (ref 4.22–5.81)
RDW: 15 % (ref 11.5–15.5)
WBC: 6.7 K/uL (ref 4.0–10.5)
nRBC: 0 % (ref 0.0–0.2)

## 2024-03-06 LAB — CBG MONITORING, ED: Glucose-Capillary: 142 mg/dL — ABNORMAL HIGH (ref 70–99)

## 2024-03-06 LAB — MRSA NEXT GEN BY PCR, NASAL: MRSA by PCR Next Gen: NOT DETECTED

## 2024-03-06 LAB — BETA-HYDROXYBUTYRIC ACID: Beta-Hydroxybutyric Acid: 0.33 mmol/L — ABNORMAL HIGH (ref 0.05–0.27)

## 2024-03-06 LAB — MAGNESIUM: Magnesium: 1.1 mg/dL — ABNORMAL LOW (ref 1.7–2.4)

## 2024-03-06 LAB — I-STAT CG4 LACTIC ACID, ED: Lactic Acid, Venous: 1.4 mmol/L (ref 0.5–1.9)

## 2024-03-06 LAB — ETHANOL: Alcohol, Ethyl (B): <15 mg/dL (ref <15)

## 2024-03-06 MED ORDER — PHENOBARBITAL SODIUM 65 MG/ML IJ SOLN
130.0000 mg | Freq: Once | INTRAMUSCULAR | Status: AC
  Administered 2024-03-06: 130 mg via INTRAVENOUS

## 2024-03-06 MED ORDER — ONDANSETRON HCL 4 MG PO TABS: 4.0000 mg | ORAL_TABLET | Freq: Four times a day (QID) | ORAL | Status: DC | PRN

## 2024-03-06 MED ORDER — ENOXAPARIN SODIUM 40 MG/0.4ML IJ SOSY
40.0000 mg | PREFILLED_SYRINGE | Freq: Every day | INTRAMUSCULAR | Status: DC
  Administered 2024-03-06 – 2024-03-10 (×5): 40 mg via SUBCUTANEOUS
  Filled 2024-03-06 (×5): qty 0.4

## 2024-03-06 MED ORDER — PANTOPRAZOLE SODIUM 40 MG PO TBEC
40.0000 mg | DELAYED_RELEASE_TABLET | Freq: Every day | ORAL | Status: DC
  Administered 2024-03-07 – 2024-03-10 (×4): 40 mg via ORAL
  Filled 2024-03-06 (×4): qty 1

## 2024-03-06 MED ORDER — THIAMINE MONONITRATE 100 MG PO TABS
100.0000 mg | ORAL_TABLET | Freq: Every day | ORAL | Status: DC
  Administered 2024-03-06 – 2024-03-10 (×5): 100 mg via ORAL
  Filled 2024-03-06 (×5): qty 1

## 2024-03-06 MED ORDER — FOLIC ACID 1 MG PO TABS
1.0000 mg | ORAL_TABLET | Freq: Every day | ORAL | Status: DC
  Administered 2024-03-07 – 2024-03-10 (×4): 1 mg via ORAL
  Filled 2024-03-06 (×4): qty 1

## 2024-03-06 MED ORDER — CHLORHEXIDINE GLUCONATE CLOTH 2 % EX PADS
6.0000 | MEDICATED_PAD | Freq: Every day | CUTANEOUS | Status: DC
  Administered 2024-03-06 – 2024-03-09 (×3): 6 via TOPICAL

## 2024-03-06 MED ORDER — PHENOBARBITAL SODIUM 130 MG/ML IJ SOLN
130.0000 mg | Freq: Once | INTRAMUSCULAR | Status: AC
  Administered 2024-03-06: 130 mg via INTRAVENOUS
  Filled 2024-03-06: qty 1

## 2024-03-06 MED ORDER — MAGNESIUM SULFATE 4 GM/100ML IV SOLN
4.0000 g | Freq: Once | INTRAVENOUS | Status: AC
  Administered 2024-03-06: 4 g via INTRAVENOUS
  Filled 2024-03-06: qty 100

## 2024-03-06 MED ORDER — POTASSIUM CHLORIDE 10 MEQ/100ML IV SOLN
INTRAVENOUS | Status: AC
  Filled 2024-03-06: qty 100

## 2024-03-06 MED ORDER — PHENOBARBITAL SODIUM 65 MG/ML IJ SOLN
130.0000 mg | Freq: Once | INTRAMUSCULAR | Status: DC
  Filled 2024-03-06: qty 2

## 2024-03-06 MED ORDER — PHENOBARBITAL SODIUM 65 MG/ML IJ SOLN
65.0000 mg | Freq: Three times a day (TID) | INTRAMUSCULAR | Status: AC
  Administered 2024-03-08 – 2024-03-10 (×6): 65 mg via INTRAVENOUS
  Filled 2024-03-06 (×6): qty 1

## 2024-03-06 MED ORDER — LORAZEPAM 1 MG PO TABS
1.0000 mg | ORAL_TABLET | ORAL | Status: AC | PRN
  Administered 2024-03-07 (×2): 1 mg via ORAL
  Administered 2024-03-07 – 2024-03-08 (×2): 2 mg via ORAL
  Filled 2024-03-06: qty 2
  Filled 2024-03-06 (×2): qty 1
  Filled 2024-03-06: qty 2

## 2024-03-06 MED ORDER — POTASSIUM CHLORIDE 10 MEQ/100ML IV SOLN
10.0000 meq | INTRAVENOUS | Status: AC
  Administered 2024-03-06 (×4): 10 meq via INTRAVENOUS
  Filled 2024-03-06 (×3): qty 100

## 2024-03-06 MED ORDER — ADULT MULTIVITAMIN W/MINERALS CH
1.0000 | ORAL_TABLET | Freq: Every day | ORAL | Status: DC
  Administered 2024-03-06 – 2024-03-10 (×5): 1 via ORAL
  Filled 2024-03-06 (×5): qty 1

## 2024-03-06 MED ORDER — IBUPROFEN 200 MG PO TABS
400.0000 mg | ORAL_TABLET | Freq: Four times a day (QID) | ORAL | Status: DC | PRN
  Administered 2024-03-07 – 2024-03-08 (×3): 400 mg via ORAL
  Filled 2024-03-06 (×3): qty 2

## 2024-03-06 MED ORDER — LEVETIRACETAM 500 MG PO TABS
500.0000 mg | ORAL_TABLET | Freq: Two times a day (BID) | ORAL | Status: DC
  Administered 2024-03-06 – 2024-03-10 (×8): 500 mg via ORAL
  Filled 2024-03-06 (×8): qty 1

## 2024-03-06 MED ORDER — SODIUM CHLORIDE 0.9 % IV BOLUS
1000.0000 mL | Freq: Once | INTRAVENOUS | Status: AC
  Administered 2024-03-06: 1000 mL via INTRAVENOUS

## 2024-03-06 MED ORDER — ORAL CARE MOUTH RINSE: 15.0000 mL | OROMUCOSAL | Status: DC | PRN

## 2024-03-06 MED ORDER — PHENOBARBITAL SODIUM 65 MG/ML IJ SOLN: 32.5000 mg | Freq: Three times a day (TID) | INTRAMUSCULAR | Status: DC

## 2024-03-06 MED ORDER — TRAZODONE HCL 50 MG PO TABS
50.0000 mg | ORAL_TABLET | Freq: Every evening | ORAL | Status: DC | PRN
  Administered 2024-03-07 – 2024-03-08 (×2): 50 mg via ORAL
  Filled 2024-03-06 (×2): qty 1

## 2024-03-06 MED ORDER — ONDANSETRON HCL 4 MG/2ML IJ SOLN: 4.0000 mg | Freq: Four times a day (QID) | INTRAMUSCULAR | Status: DC | PRN

## 2024-03-06 MED ORDER — ALBUTEROL SULFATE (2.5 MG/3ML) 0.083% IN NEBU: 2.5000 mg | INHALATION_SOLUTION | RESPIRATORY_TRACT | Status: DC | PRN

## 2024-03-06 MED ORDER — POTASSIUM CHLORIDE 20 MEQ PO PACK
60.0000 meq | PACK | Freq: Once | ORAL | Status: AC
  Administered 2024-03-06: 60 meq via ORAL
  Filled 2024-03-06: qty 3

## 2024-03-06 MED ORDER — GABAPENTIN 300 MG PO CAPS
300.0000 mg | ORAL_CAPSULE | Freq: Two times a day (BID) | ORAL | Status: DC
  Administered 2024-03-06 – 2024-03-10 (×8): 300 mg via ORAL
  Filled 2024-03-06 (×8): qty 1

## 2024-03-06 MED ORDER — PHENOBARBITAL SODIUM 130 MG/ML IJ SOLN
97.5000 mg | Freq: Three times a day (TID) | INTRAMUSCULAR | Status: AC
  Administered 2024-03-06 – 2024-03-08 (×6): 97.5 mg via INTRAVENOUS
  Filled 2024-03-06 (×6): qty 1

## 2024-03-06 NOTE — H&P (Addendum)
 History and Physical  [name] FMW:[id number] DOB: December 12, 1970 DOA: 03/06/2024  PCP: Tanda Bleacher, MD   Chief Complaint: Seizure  HPI: [name] is a 53 y.o. male with medical history significant for PTSD, schizoaffective disorder, alcohol  related liver cirrhosis and history of withdrawal seizures being admitted to the hospital with witnessed alcohol  withdrawal seizure today.  Patient states he last drank alcohol  probably 2 to 3 days ago, he has intermittently been trying to stop drinking.  He also states that he has had some diarrhea and vomiting for the last few days.  Otherwise, he was feeling decently well when he went to [employer] today.  While at the store, he started to feel strange and wondered whether another seizure was coming on.  Apparently had a witnessed generalized tonic-clonic seizure lasting a couple of minutes.  Review of Systems: Please see HPI for pertinent positives and negatives. A complete 10 system review of systems are otherwise negative.  Past Medical History:  Diagnosis Date   Alcohol  abuse    Anxiety    at age 22   Blood transfusion without reported diagnosis    Cirrhosis (HCC)    Depression    at age 20   GERD (gastroesophageal reflux disease)    Hypertension    Nausea and vomiting    Psychosis (HCC)    PTSD (post-traumatic stress disorder)    Schizoaffective disorder    Seizure disorder (HCC)    related to etoh seizure   Symptomatic anemia    Past Surgical History:  Procedure Laterality Date   ESOPHAGOGASTRODUODENOSCOPY (EGD) WITH PROPOFOL  N/A 05/05/2020   Procedure: ESOPHAGOGASTRODUODENOSCOPY (EGD) WITH PROPOFOL ;  Surgeon: Shila Gustav GAILS, MD;  Location: [facility];  Service: Endoscopy;  Laterality: N/A;   FOOT SURGERY     right   HAND SURGERY     Social History:  reports that he has been smoking cigarettes. He has a 45 pack-year smoking history. He has never used smokeless tobacco. He reports current alcohol  use. He reports  current drug use. Frequency: 1.00 time per week. Drug: Marijuana.  Allergies  Allergen Reactions   Bupropion Other (See Comments)    Caused seizures Caused seizures Feel as though I will have a seizure when taking this med    Family History  Problem Relation Age of Onset   Alcoholism Mother    CAD Father    Depression Brother    Colon polyps Neg Hx    Esophageal cancer Neg Hx    Pancreatic cancer Neg Hx    Stomach cancer Neg Hx      Prior to Admission medications   Medication Sig Start Date End Date Taking? Authorizing Provider  allopurinol  (ZYLOPRIM ) 100 MG tablet Take 1 tablet (100 mg total) by mouth daily. 01/15/24   Elgergawy, Brayton RAMAN, MD  chlordiazePOXIDE  (LIBRIUM ) 10 MG capsule Take 1 capsule (10mg ) by mouth 3 times daily x 2 days, then 1 capsule (10mg ) twice daily x 3 days, then 1 capsule (10mg ) once daily x 3 days then stop. 01/15/24   Elgergawy, Brayton RAMAN, MD  furosemide  (LASIX ) 20 MG tablet Take 1 tablet (20 mg total) by mouth daily. 01/28/24   Lorren Greig PARAS, NP  gabapentin  (NEURONTIN ) 300 MG capsule Take 1 capsule (300 mg total) by mouth 2 (two) times daily. 01/15/24   Elgergawy, Brayton RAMAN, MD  Lactulose  20 GM/30ML SOLN Take 15 mLs (10 g total) by mouth 3 (three) times daily. 01/15/24   Elgergawy, Brayton RAMAN, MD  levETIRAcetam  (KEPPRA ) 500  MG tablet Take 1 tablet (500 mg total) by mouth 2 (two) times daily. 01/15/24 01/14/25  Elgergawy, Brayton RAMAN, MD  midodrine  (PROAMATINE ) 5 MG tablet Take 1 tablet (5 mg total) by mouth 3 (three) times daily with meals. 01/15/24   Elgergawy, Brayton RAMAN, MD  Multiple Vitamin (MULTIVITAMIN WITH MINERALS) TABS tablet Take 1 tablet by mouth daily. 01/15/24   Elgergawy, Brayton RAMAN, MD  nicotine  (NICODERM CQ  - DOSED IN MG/24 HOURS) 21 mg/24hr patch Place 1 patch (21 mg total) onto the skin daily. 01/15/24   Elgergawy, Brayton RAMAN, MD  pantoprazole  (PROTONIX ) 40 MG tablet Take 1 tablet (40 mg total) by mouth daily. 01/15/24   Elgergawy, Brayton RAMAN, MD  QUEtiapine   (SEROQUEL ) 200 MG tablet Take 1 tablet (200 mg total) by mouth at bedtime. 03/12/23 03/11/24  Von Bellis, MD  sertraline  (ZOLOFT ) 25 MG tablet Take 1 tablet (25 mg total) by mouth daily. 03/12/23 03/11/24  Von Bellis, MD  thiamine  (VITAMIN B-1) 100 MG tablet Take 1 tablet (100 mg total) by mouth daily. 01/15/24   Elgergawy, Brayton RAMAN, MD    Physical Exam: BP 128/84   Pulse (!) 101   Temp 99 F (37.2 C) (Oral)   Resp 19   Ht 6' (1.829 m)   Wt 97.5 kg   SpO2 91%   BMI 29.16 kg/m  General:  Alert, oriented, calm, in no acute distress  Eyes: EOMI, clear conjuctivae, white sclerea Neck: supple, no masses, trachea mildline  Cardiovascular: RRR, no murmurs or rubs, no peripheral edema  Respiratory: clear to auscultation bilaterally, no wheezes, no crackles  Abdomen: soft, nontender, nondistended, normal bowel tones heard  Skin: dry, no rashes  Musculoskeletal: no joint effusions, normal range of motion  Psychiatric: appropriate affect, normal speech  Neurologic: extraocular muscles intact, clear speech, moving all extremities with intact sensorium         Labs on Admission:  Basic Metabolic Panel: Recent Labs  Lab 03/06/24 1515 03/06/24 1546  NA 132*  --   K 2.4*  --   CL 85*  --   CO2 27  --   GLUCOSE 127*  --   BUN 12  --   CREATININE 0.92  --   CALCIUM  9.0  --   MG  --  1.1*   Liver Function Tests: Recent Labs  Lab 03/06/24 1515  AST 61*  ALT 20  ALKPHOS 108  BILITOT 4.3*  PROT 7.1  ALBUMIN  4.0   No results for input(s): LIPASE, AMYLASE in the last 168 hours. No results for input(s): AMMONIA in the last 168 hours. CBC: Recent Labs  Lab 03/06/24 1515  WBC 6.7  NEUTROABS 5.0  HGB 15.0  HCT 43.2  MCV 94.3  PLT 75*   Cardiac Enzymes: No results for input(s): CKTOTAL, CKMB, CKMBINDEX, TROPONINI in the last 168 hours. BNP (last 3 results) Recent Labs    11/13/23 0531 11/14/23 0515 01/09/24 0601  BNP 52.0 89.7 54.3    ProBNP (last 3  results) No results for input(s): PROBNP in the last 8760 hours.  CBG: Recent Labs  Lab 03/06/24 1508  GLUCAP 142*    Radiological Exams on Admission: DG Chest Portable 1 View Result Date: 03/06/2024 CLINICAL DATA:  Chest pain.  Witnessed seizure. EXAM: PORTABLE CHEST 1 VIEW COMPARISON:  Chest radiograph 01/08/2024 FINDINGS: The cardiomediastinal contours are normal. The lungs are clear. Pulmonary vasculature is normal. No consolidation, pleural effusion, or pneumothorax. Again seen remote right rib fracture. No acute osseous abnormalities are  seen. IMPRESSION: No acute chest findings. Electronically Signed   By: Andrea Gasman M.D.   On: 03/06/2024 16:43   CT Head Wo Contrast Result Date: 03/06/2024 CLINICAL DATA:  Provided history: Head trauma, coagulopathy (Age 27-64y) Witnessed seizure at fluid line.  History of seizures. EXAM: CT HEAD WITHOUT CONTRAST TECHNIQUE: Contiguous axial images were obtained from the base of the skull through the vertex without intravenous contrast. RADIATION DOSE REDUCTION: This exam was performed according to the departmental dose-optimization program which includes automated exposure control, adjustment of the mA and/or kV according to patient size and/or use of iterative reconstruction technique. COMPARISON:  Head CT 01/08/2024 FINDINGS: Brain: No intracranial hemorrhage, mass effect, or midline shift. Stable atrophy. No hydrocephalus. The basilar cisterns are patent. No evidence of territorial infarct or acute ischemia. No extra-axial or intracranial fluid collection. Vascular: No hyperdense vessel or unexpected calcification. Skull: No fracture or focal lesion. Sinuses/Orbits: Paranasal sinuses and mastoid air cells are clear. The visualized orbits are unremarkable. Other: No confluent scalp hematoma. IMPRESSION: 1. No acute intracranial abnormality. No skull fracture. 2. Stable atrophy. Electronically Signed   By: Andrea Gasman M.D.   On: 03/06/2024 16:42    Assessment/Plan [name] is a 53 y.o. male with medical history significant for PTSD, schizoaffective disorder, alcohol  related liver cirrhosis and history of withdrawal seizures being admitted to the hospital with witnessed alcohol  withdrawal seizure.  Alcohol  withdrawal-patient thinks his last drink of alcohol  was sometime in the last 2 or 3 days, he is not sure.  He does have a history of alcoholic liver cirrhosis, as well as alcohol  withdrawal seizures. -Observation admission to stepdown -Thiamine , folate, multivitamin -Phenobarbital  taper, with p.o. Ativan  per CIWA protocol  Seizure-CT head without acute intracranial abnormality, seizure most likely due to alcohol  withdrawal, patient states he has been compliant with Keppra  -Continue Keppra   Severe hypokalemia and hypomagnesemia-likely due to recent vomiting and diarrhea from alcohol  use, as well as nutritional deficiency -Potassium repleted IV and p.o. -Magnesium  IV repletion -Recheck electrolytes again in the morning  Chronic pain-gabapentin   GERD-Protonix   History of liver cirrhosis-continue midodrine , consider resuming Toprol -XL and Aldactone  if blood pressure stable  DVT prophylaxis: Lovenox      Code Status: Full Code  Consults called: None  Admission status: Observation  Time spent: 48 minutes  Lamika Connolly CHRISTELLA Gail MD [facility] Pager [phone]  If 7PM-7AM, please contact night-coverage [email or web address] Password [facility]  03/06/2024, 6:04 PM

## 2024-03-06 NOTE — ED Provider Notes (Signed)
 [place] [facility] AT [facility] Provider Note  CSN: [id number] Arrival date & time: 03/06/24 1448  Chief Complaint(s) Seizures  HPI [name] is a 53 y.o. male history of alcohol  use disorder, cirrhosis, seizure disorder, schizoaffective disorder presenting to the emergency department with seizure.  He does not quite remember what happened but knows he was at the grocery store.  Reports he usually gets a seizure when he is trying to stop drinking.  Does not want to drink and has tried to quit multiple times.  Last drink was a few days ago.  Has been compliant with his Keppra .  EMS found patient was postictal and confused.  On arrival to the emergency department patient is feeling mostly back to normal.  Does report some anxiety and tremor.  Did hit his head and has a mild headache.  Also has a bruise to the back with some pain, not sure if he fell.  Denies any neck pain.   Past Medical History Past Medical History:  Diagnosis Date   Alcohol  abuse    Anxiety    at age 53   Blood transfusion without reported diagnosis    Cirrhosis (HCC)    Depression    at age 37   GERD (gastroesophageal reflux disease)    Hypertension    Nausea and vomiting    Psychosis (HCC)    PTSD (post-traumatic stress disorder)    Schizoaffective disorder    Seizure disorder (HCC)    related to etoh seizure   Symptomatic anemia    Patient Active Problem List   Diagnosis Date Noted   Alcohol  withdrawal seizure (HCC) 03/06/2024   Seizure (HCC) 01/08/2024   Hypervolemic hyponatremia 03/05/2023   Hypocalcemia 03/05/2023   Hepatic cirrhosis (HCC) 03/05/2023   History of seizure 03/05/2023   Leukocytosis 03/05/2023   Chronic anemia 03/05/2023   Schizoaffective disorder (HCC) 03/05/2023   Transaminitis 03/05/2023   Elevated bilirubin 03/05/2023   Hypochloremia 03/05/2023   Alcohol  withdrawal (HCC) 02/02/2022   Hyperammonemia (HCC) 02/02/2022   Alcohol  withdrawal syndrome  without complication (HCC) 01/10/2022   Acute gout 01/10/2022   Elevated liver function tests    Nausea and vomiting    Trigger finger, right middle finger 05/20/2021   Alcoholic cirrhosis of liver with ascites (HCC) 03/08/2021   Polysubstance abuse (HCC) 03/08/2021   Secondary esophageal varices without bleeding (HCC) 03/08/2021   Macrocytic anemia    Hepatic steatosis    Hypomagnesemia 05/03/2020   Hyperbilirubinemia 05/03/2020   Alcoholism (HCC) 05/03/2020   Alcoholic hepatitis without ascites 03/06/2019   Essential hypertension 01/10/2019   Alcohol  intoxication (HCC) 01/10/2019   Schizoaffective disorder, bipolar type (HCC) 06/02/2018   Alcohol -induced mood disorder (HCC) 02/22/2016   Gout 10/22/2015   GERD (gastroesophageal reflux disease) 10/22/2015   Schizoaffective disorder, depressive type (HCC)    History of alcohol  abuse    Psychosis (HCC) 05/13/2015   Tobacco dependence due to cigarettes 11/20/2014   Abnormal LFTs    Alcohol  abuse 02/09/2014   PTSD (post-traumatic stress disorder) 11/03/2011   Hypokalemia 07/02/2011   Home Medication(s) Prior to Admission medications   Medication Sig Start Date End Date Taking? Authorizing Provider  allopurinol  (ZYLOPRIM ) 100 MG tablet Take 1 tablet (100 mg total) by mouth daily. 01/15/24   Elgergawy, Brayton RAMAN, MD  chlordiazePOXIDE  (LIBRIUM ) 10 MG capsule Take 1 capsule (10mg ) by mouth 3 times daily x 2 days, then 1 capsule (10mg ) twice daily x 3 days, then 1 capsule (10mg ) once daily x  3 days then stop. 01/15/24   Elgergawy, Brayton RAMAN, MD  furosemide  (LASIX ) 20 MG tablet Take 1 tablet (20 mg total) by mouth daily. 01/28/24   Lorren Greig PARAS, NP  gabapentin  (NEURONTIN ) 300 MG capsule Take 1 capsule (300 mg total) by mouth 2 (two) times daily. 01/15/24   Elgergawy, Brayton RAMAN, MD  Lactulose  20 GM/30ML SOLN Take 15 mLs (10 g total) by mouth 3 (three) times daily. 01/15/24   Elgergawy, Brayton RAMAN, MD  levETIRAcetam  (KEPPRA ) 500 MG tablet Take 1  tablet (500 mg total) by mouth 2 (two) times daily. 01/15/24 01/14/25  Elgergawy, Brayton RAMAN, MD  midodrine  (PROAMATINE ) 5 MG tablet Take 1 tablet (5 mg total) by mouth 3 (three) times daily with meals. 01/15/24   Elgergawy, Brayton RAMAN, MD  Multiple Vitamin (MULTIVITAMIN WITH MINERALS) TABS tablet Take 1 tablet by mouth daily. 01/15/24   Elgergawy, Brayton RAMAN, MD  nicotine  (NICODERM CQ  - DOSED IN MG/24 HOURS) 21 mg/24hr patch Place 1 patch (21 mg total) onto the skin daily. 01/15/24   Elgergawy, Brayton RAMAN, MD  pantoprazole  (PROTONIX ) 40 MG tablet Take 1 tablet (40 mg total) by mouth daily. 01/15/24   Elgergawy, Brayton RAMAN, MD  QUEtiapine  (SEROQUEL ) 200 MG tablet Take 1 tablet (200 mg total) by mouth at bedtime. 03/12/23 03/11/24  Von Bellis, MD  sertraline  (ZOLOFT ) 25 MG tablet Take 1 tablet (25 mg total) by mouth daily. 03/12/23 03/11/24  Von Bellis, MD  thiamine  (VITAMIN B-1) 100 MG tablet Take 1 tablet (100 mg total) by mouth daily. 01/15/24   Elgergawy, Brayton RAMAN, MD                                                                                                                                    Past Surgical History Past Surgical History:  Procedure Laterality Date   ESOPHAGOGASTRODUODENOSCOPY (EGD) WITH PROPOFOL  N/A 05/05/2020   Procedure: ESOPHAGOGASTRODUODENOSCOPY (EGD) WITH PROPOFOL ;  Surgeon: Shila Gustav GAILS, MD;  Location: [facility] END[facility];  Service: Endoscopy;  Laterality: N/A;   FOOT SURGERY     right   HAND SURGERY     Family History Family History  Problem Relation Age of Onset   Alcoholism Mother    CAD Father    Depression Brother    Colon polyps Neg Hx    Esophageal cancer Neg Hx    Pancreatic cancer Neg Hx    Stomach cancer Neg Hx     Social History Social History   Tobacco Use   Smoking status: Every Day    Current packs/day: 1.50    Average packs/day: 1.5 packs/day for 30.0 years (45.0 ttl pk-yrs)    Types: Cigarettes   Smokeless tobacco: Never  Vaping Use   Vaping  status: Never Used  Substance Use Topics   Alcohol  use: Yes    Comment: until I pass out; last drink 01/03/24   Drug use: Yes    Frequency:  1.0 times per week    Types: Marijuana   Allergies Bupropion  Review of Systems Review of Systems  All other systems reviewed and are negative.   Physical Exam Vital Signs  I have reviewed the triage vital signs BP 128/84   Pulse (!) 101   Temp 99 F (37.2 C) (Oral)   Resp 19   Ht 6' (1.829 m)   Wt 97.5 kg   SpO2 91%   BMI 29.16 kg/m  Physical Exam Vitals and nursing note reviewed.  Constitutional:      General: He is not in acute distress.    Appearance: Normal appearance.  HENT:     Head: Normocephalic.     Comments: Small contusion to posterior scalp    Mouth/Throat:     Mouth: Mucous membranes are moist.  Eyes:     Conjunctiva/sclera: Conjunctivae normal.  Cardiovascular:     Rate and Rhythm: Normal rate and regular rhythm.  Pulmonary:     Effort: Pulmonary effort is normal. No respiratory distress.     Breath sounds: Normal breath sounds.  Abdominal:     General: Abdomen is flat.     Palpations: Abdomen is soft.     Tenderness: There is no abdominal tenderness.  Musculoskeletal:     Right lower leg: No edema.     Left lower leg: No edema.     Comments: No midline C, T, L-spine tenderness.  Mild right posterior chest wall tenderness without crepitus with small amount of bruising.  Extremities atraumatic  Skin:    General: Skin is warm and dry.     Capillary Refill: Capillary refill takes less than 2 seconds.  Neurological:     Mental Status: He is alert and oriented to person, place, and time. Mental status is at baseline.     Comments: Mild tremor and tongue fasciculation  Psychiatric:        Mood and Affect: Mood normal.        Behavior: Behavior normal.     ED Results and Treatments Labs (all labs ordered are listed, but only abnormal results are displayed) Labs Reviewed  COMPREHENSIVE METABOLIC PANEL  WITH GFR - Abnormal; Notable for the following components:      Result Value   Sodium 132 (*)    Potassium 2.4 (*)    Chloride 85 (*)    Glucose, Bld 127 (*)    AST 61 (*)    Total Bilirubin 4.3 (*)    Anion gap 20 (*)    All other components within normal limits  CBC WITH DIFFERENTIAL/PLATELET - Abnormal; Notable for the following components:   Platelets 75 (*)    All other components within normal limits  MAGNESIUM  - Abnormal; Notable for the following components:   Magnesium  1.1 (*)    All other components within normal limits  BETA-HYDROXYBUTYRIC ACID - Abnormal; Notable for the following components:   Beta-Hydroxybutyric Acid 0.33 (*)    All other components within normal limits  CBG MONITORING, ED - Abnormal; Notable for the following components:   Glucose-Capillary 142 (*)    All other components within normal limits  ETHANOL  LEVETIRACETAM  LEVEL  HIV ANTIBODY (ROUTINE TESTING W REFLEX)  COMPREHENSIVE METABOLIC PANEL WITH GFR  CBC  I-STAT CG4 LACTIC ACID, ED  Radiology DG Chest Portable 1 View Result Date: 03/06/2024 CLINICAL DATA:  Chest pain.  Witnessed seizure. EXAM: PORTABLE CHEST 1 VIEW COMPARISON:  Chest radiograph 01/08/2024 FINDINGS: The cardiomediastinal contours are normal. The lungs are clear. Pulmonary vasculature is normal. No consolidation, pleural effusion, or pneumothorax. Again seen remote right rib fracture. No acute osseous abnormalities are seen. IMPRESSION: No acute chest findings. Electronically Signed   By: Andrea Gasman M.D.   On: 03/06/2024 16:43   CT Head Wo Contrast Result Date: 03/06/2024 CLINICAL DATA:  Provided history: Head trauma, coagulopathy (Age 73-66y) Witnessed seizure at fluid line.  History of seizures. EXAM: CT HEAD WITHOUT CONTRAST TECHNIQUE: Contiguous axial images were obtained from the base of the skull through  the vertex without intravenous contrast. RADIATION DOSE REDUCTION: This exam was performed according to the departmental dose-optimization program which includes automated exposure control, adjustment of the mA and/or kV according to patient size and/or use of iterative reconstruction technique. COMPARISON:  Head CT 01/08/2024 FINDINGS: Brain: No intracranial hemorrhage, mass effect, or midline shift. Stable atrophy. No hydrocephalus. The basilar cisterns are patent. No evidence of territorial infarct or acute ischemia. No extra-axial or intracranial fluid collection. Vascular: No hyperdense vessel or unexpected calcification. Skull: No fracture or focal lesion. Sinuses/Orbits: Paranasal sinuses and mastoid air cells are clear. The visualized orbits are unremarkable. Other: No confluent scalp hematoma. IMPRESSION: 1. No acute intracranial abnormality. No skull fracture. 2. Stable atrophy. Electronically Signed   By: Andrea Gasman M.D.   On: 03/06/2024 16:42    Pertinent labs & imaging results that were available during my care of the patient were reviewed by me and considered in my medical decision making (see MDM for details).  Medications Ordered in ED Medications  PHENObarbital  (LUMINAL) injection 130 mg (has no administration in time range)    And  PHENObarbital  (LUMINAL) injection 130 mg (130 mg Intravenous Given 03/06/24 1714)  magnesium  sulfate IVPB 4 g 100 mL (4 g Intravenous New Bag/Given 03/06/24 1708)  potassium chloride  10 mEq in 100 mL IVPB (10 mEq Intravenous New Bag/Given 03/06/24 1729)  gabapentin  (NEURONTIN ) capsule 300 mg (has no administration in time range)  pantoprazole  (PROTONIX ) EC tablet 40 mg (has no administration in time range)  levETIRAcetam  (KEPPRA ) tablet 500 mg (has no administration in time range)  enoxaparin  (LOVENOX ) injection 40 mg (has no administration in time range)  ibuprofen  (ADVIL ) tablet 400 mg (has no administration in time range)  traZODone  (DESYREL )  tablet 50 mg (has no administration in time range)  ondansetron  (ZOFRAN ) tablet 4 mg (has no administration in time range)    Or  ondansetron  (ZOFRAN ) injection 4 mg (has no administration in time range)  albuterol  (PROVENTIL ) (2.5 MG/3ML) 0.083% nebulizer solution 2.5 mg (has no administration in time range)  folic acid  (FOLVITE ) tablet 1 mg (has no administration in time range)  multivitamin with minerals tablet 1 tablet (has no administration in time range)  thiamine  (VITAMIN B1) tablet 100 mg (has no administration in time range)  sodium chloride  0.9 % bolus 1,000 mL (1,000 mLs Intravenous New Bag/Given 03/06/24 1712)  potassium chloride  (KLOR-CON ) packet 60 mEq (60 mEq Oral Given 03/06/24 1712)  Procedures .Critical Care  Performed by: Francesca Elsie CROME, MD Authorized by: Francesca Elsie CROME, MD   Critical care provider statement:    Critical care time (minutes):  32   Critical care was necessary to treat or prevent imminent or life-threatening deterioration of the following conditions:  Metabolic crisis and CNS failure or compromise   Critical care was time spent personally by me on the following activities:  Development of treatment plan with patient or surrogate, discussions with consultants, evaluation of patient's response to treatment, examination of patient, ordering and review of laboratory studies, ordering and review of radiographic studies, ordering and performing treatments and interventions, pulse oximetry, re-evaluation of patient's condition and review of old charts   Care discussed with: admitting provider     (including critical care time)  Medical Decision Making / ED Course   MDM:  53 year old presenting to the emergency department after seizure.  Patient overall well-appearing, vitals with mild tachycardia.  Physical exam with  signs of mild alcohol  withdrawal.  Suspect seizure likely due to alcohol  withdrawal, received phenobarbital  with improvement of vital signs and feels better.  Also likely component of metabolic derangement.  Laboratory testing notable for hypokalemia, hypomagnesemia, anion gap likely due to lactic acidosis from seizure or mild elevated beta hydroxybutyrate due to starvation/alcoholic ketosis.  CT head obtained and negative for any intracranial process or injury.  Chest x-ray obtained given bruising to chest wall negative for pneumothorax or significant injury.  No further seizures in the emergency department given degree of metabolic derangement patient would benefit from admission for correcting this and further monitoring of alcohol  withdrawal.  Patient is agreeable for this.  Discussed with Dr. Roxane who has accepted patient.     Additional history obtained: -Additional history obtained from ems -External records from outside source obtained and reviewed including: Chart review including previous notes, labs, imaging, consultation notes including prior er notes    Lab Tests: -I ordered, reviewed, and interpreted labs.   The pertinent results include:   Labs Reviewed  COMPREHENSIVE METABOLIC PANEL WITH GFR - Abnormal; Notable for the following components:      Result Value   Sodium 132 (*)    Potassium 2.4 (*)    Chloride 85 (*)    Glucose, Bld 127 (*)    AST 61 (*)    Total Bilirubin 4.3 (*)    Anion gap 20 (*)    All other components within normal limits  CBC WITH DIFFERENTIAL/PLATELET - Abnormal; Notable for the following components:   Platelets 75 (*)    All other components within normal limits  MAGNESIUM  - Abnormal; Notable for the following components:   Magnesium  1.1 (*)    All other components within normal limits  BETA-HYDROXYBUTYRIC ACID - Abnormal; Notable for the following components:   Beta-Hydroxybutyric Acid 0.33 (*)    All other components within normal limits   CBG MONITORING, ED - Abnormal; Notable for the following components:   Glucose-Capillary 142 (*)    All other components within normal limits  ETHANOL  LEVETIRACETAM  LEVEL  HIV ANTIBODY (ROUTINE TESTING W REFLEX)  COMPREHENSIVE METABOLIC PANEL WITH GFR  CBC  I-STAT CG4 LACTIC ACID, ED    Notable for metabolic derangements  EKG   EKG Interpretation Date/Time:  Sunday March 06 2024 15:05:06 EDT Ventricular Rate:  110 PR Interval:  155 QRS Duration:  86 QT Interval:  384 QTC Calculation: 520 R Axis:   57  Text Interpretation: Sinus tachycardia Prolonged QT interval Confirmed by  Francesca Fallow ([id number]) on 03/06/2024 5:46:49 PM         Imaging Studies ordered: I ordered imaging studies including CT head, CXR On my interpretation imaging demonstrates no acute injury I independently visualized and interpreted imaging. I agree with the radiologist interpretation   Medicines ordered and prescription drug management: Meds ordered this encounter  Medications   AND Linked Order Group    PHENObarbital  (LUMINAL) injection 130 mg    PHENObarbital  (LUMINAL) injection 130 mg   sodium chloride  0.9 % bolus 1,000 mL   magnesium  sulfate IVPB 4 g 100 mL   potassium chloride  (KLOR-CON ) packet 60 mEq   potassium chloride  10 mEq in 100 mL IVPB   gabapentin  (NEURONTIN ) capsule 300 mg   pantoprazole  (PROTONIX ) EC tablet 40 mg   levETIRAcetam  (KEPPRA ) tablet 500 mg   enoxaparin  (LOVENOX ) injection 40 mg   ibuprofen  (ADVIL ) tablet 400 mg   traZODone  (DESYREL ) tablet 50 mg   OR Linked Order Group    ondansetron  (ZOFRAN ) tablet 4 mg    ondansetron  (ZOFRAN ) injection 4 mg   albuterol  (PROVENTIL ) (2.5 MG/3ML) 0.083% nebulizer solution 2.5 mg   folic acid  (FOLVITE ) tablet 1 mg   multivitamin with minerals tablet 1 tablet   thiamine  (VITAMIN B1) tablet 100 mg    -I have reviewed the patients home medicines and have made adjustments as needed   Consultations Obtained: I requested  consultation with the hospitalist,  and discussed lab and imaging findings as well as pertinent plan - they recommend: admission   Cardiac Monitoring: The patient was maintained on a cardiac monitor.  I personally viewed and interpreted the cardiac monitored which showed an underlying rhythm of: sinus tachycardia  Social Determinants of Health:  Diagnosis or treatment significantly limited by social determinants of health: alcohol  use   Reevaluation: After the interventions noted above, I reevaluated the patient and found that their symptoms have improved  Co morbidities that complicate the patient evaluation  Past Medical History:  Diagnosis Date   Alcohol  abuse    Anxiety    at age 84   Blood transfusion without reported diagnosis    Cirrhosis (HCC)    Depression    at age 82   GERD (gastroesophageal reflux disease)    Hypertension    Nausea and vomiting    Psychosis (HCC)    PTSD (post-traumatic stress disorder)    Schizoaffective disorder    Seizure disorder (HCC)    related to etoh seizure   Symptomatic anemia       Dispostion: Disposition decision including need for hospitalization was considered, and patient admitted to the hospital.    Final Clinical Impression(s) / ED Diagnoses Final diagnoses:  Alcohol  withdrawal seizure without complication (HCC)  Hypokalemia  Hypomagnesemia  Alcohol  withdrawal syndrome with complication (HCC)     This chart was dictated using voice recognition software.  Despite best efforts to proofread,  errors can occur which can change the documentation meaning.    Francesca Fallow CROME, MD 03/06/24 [phone]

## 2024-03-06 NOTE — ED Triage Notes (Addendum)
 Pt BIBA from [facility] for witnessed seizure lasting about 1m. Post ictal at EMS arrival. A&O on arrival. Hx seizures, taking meds as prescribed. Reports drinking 5 tallboys daily up to 2-3 days ago. Hx of seizures on withdrawal. Small abrasion to posterior head, no bleeding. 12L unremarkable. 20ga LW  124/88 HR 120 93% RA CBG 110

## 2024-03-07 ENCOUNTER — Inpatient Hospital Stay (HOSPITAL_COMMUNITY)
Admit: 2024-03-07 | Discharge: 2024-03-07 | Disposition: A | Discharge status: Home / Self Care | Attending: Internal Medicine

## 2024-03-07 ENCOUNTER — Encounter (HOSPITAL_COMMUNITY): Payer: Self-pay | Admitting: Internal Medicine

## 2024-03-07 DIAGNOSIS — M109 Gout, unspecified: Secondary | ICD-10-CM | POA: Diagnosis not present

## 2024-03-07 DIAGNOSIS — F10232 Alcohol dependence with withdrawal with perceptual disturbance: Secondary | ICD-10-CM

## 2024-03-07 DIAGNOSIS — Z811 Family history of alcohol abuse and dependence: Secondary | ICD-10-CM | POA: Diagnosis not present

## 2024-03-07 DIAGNOSIS — R0602 Shortness of breath: Secondary | ICD-10-CM | POA: Diagnosis not present

## 2024-03-07 DIAGNOSIS — D6959 Other secondary thrombocytopenia: Secondary | ICD-10-CM | POA: Diagnosis present

## 2024-03-07 DIAGNOSIS — E871 Hypo-osmolality and hyponatremia: Secondary | ICD-10-CM | POA: Diagnosis not present

## 2024-03-07 DIAGNOSIS — G629 Polyneuropathy, unspecified: Secondary | ICD-10-CM | POA: Diagnosis present

## 2024-03-07 DIAGNOSIS — I10 Essential (primary) hypertension: Secondary | ICD-10-CM | POA: Diagnosis present

## 2024-03-07 DIAGNOSIS — F10939 Alcohol use, unspecified with withdrawal, unspecified: Secondary | ICD-10-CM | POA: Diagnosis not present

## 2024-03-07 DIAGNOSIS — R569 Unspecified convulsions: Secondary | ICD-10-CM | POA: Diagnosis not present

## 2024-03-07 DIAGNOSIS — F419 Anxiety disorder, unspecified: Secondary | ICD-10-CM | POA: Diagnosis present

## 2024-03-07 DIAGNOSIS — G40509 Epileptic seizures related to external causes, not intractable, without status epilepticus: Secondary | ICD-10-CM | POA: Diagnosis present

## 2024-03-07 DIAGNOSIS — F10930 Alcohol use, unspecified with withdrawal, uncomplicated: Secondary | ICD-10-CM | POA: Diagnosis not present

## 2024-03-07 DIAGNOSIS — Z8249 Family history of ischemic heart disease and other diseases of the circulatory system: Secondary | ICD-10-CM | POA: Diagnosis not present

## 2024-03-07 DIAGNOSIS — K219 Gastro-esophageal reflux disease without esophagitis: Secondary | ICD-10-CM | POA: Diagnosis not present

## 2024-03-07 DIAGNOSIS — E876 Hypokalemia: Secondary | ICD-10-CM

## 2024-03-07 DIAGNOSIS — F4310 Post-traumatic stress disorder, unspecified: Secondary | ICD-10-CM | POA: Diagnosis present

## 2024-03-07 DIAGNOSIS — F259 Schizoaffective disorder, unspecified: Secondary | ICD-10-CM

## 2024-03-07 DIAGNOSIS — K7030 Alcoholic cirrhosis of liver without ascites: Secondary | ICD-10-CM | POA: Diagnosis present

## 2024-03-07 DIAGNOSIS — G40909 Epilepsy, unspecified, not intractable, without status epilepticus: Secondary | ICD-10-CM | POA: Diagnosis not present

## 2024-03-07 DIAGNOSIS — F17210 Nicotine dependence, cigarettes, uncomplicated: Secondary | ICD-10-CM | POA: Diagnosis present

## 2024-03-07 DIAGNOSIS — F10239 Alcohol dependence with withdrawal, unspecified: Secondary | ICD-10-CM | POA: Diagnosis present

## 2024-03-07 DIAGNOSIS — Z888 Allergy status to other drugs, medicaments and biological substances status: Secondary | ICD-10-CM | POA: Diagnosis not present

## 2024-03-07 DIAGNOSIS — G8929 Other chronic pain: Secondary | ICD-10-CM | POA: Diagnosis present

## 2024-03-07 DIAGNOSIS — R918 Other nonspecific abnormal finding of lung field: Secondary | ICD-10-CM | POA: Diagnosis not present

## 2024-03-07 DIAGNOSIS — Z79899 Other long term (current) drug therapy: Secondary | ICD-10-CM | POA: Diagnosis not present

## 2024-03-07 DIAGNOSIS — Z6372 Alcoholism and drug addiction in family: Secondary | ICD-10-CM | POA: Diagnosis not present

## 2024-03-07 DIAGNOSIS — Z818 Family history of other mental and behavioral disorders: Secondary | ICD-10-CM | POA: Diagnosis not present

## 2024-03-07 LAB — COMPREHENSIVE METABOLIC PANEL WITH GFR
ALT: 19 U/L (ref 0–44)
AST: 48 U/L — ABNORMAL HIGH (ref 15–41)
Albumin: 3.4 g/dL — ABNORMAL LOW (ref 3.5–5.0)
Alkaline Phosphatase: 91 U/L (ref 38–126)
Anion gap: 13 (ref 5–15)
BUN: 9 mg/dL (ref 6–20)
CO2: 28 mmol/L (ref 22–32)
Calcium: 8.5 mg/dL — ABNORMAL LOW (ref 8.9–10.3)
Chloride: 94 mmol/L — ABNORMAL LOW (ref 98–111)
Creatinine, Ser: 0.69 mg/dL (ref 0.61–1.24)
GFR, Estimated: >60 mL/min (ref >60)
Glucose, Bld: 93 mg/dL (ref 70–99)
Potassium: 2.9 mmol/L — ABNORMAL LOW (ref 3.5–5.1)
Sodium: 135 mmol/L (ref 135–145)
Total Bilirubin: 3.8 mg/dL — ABNORMAL HIGH (ref 0.0–1.2)
Total Protein: 6.3 g/dL — ABNORMAL LOW (ref 6.5–8.1)

## 2024-03-07 LAB — RAPID URINE DRUG SCREEN, HOSP PERFORMED
Amphetamines: NOT DETECTED
Barbiturates: POSITIVE — AB
Benzodiazepines: NOT DETECTED
Cocaine: NOT DETECTED
Opiates: NOT DETECTED
Tetrahydrocannabinol: POSITIVE — AB

## 2024-03-07 LAB — BASIC METABOLIC PANEL WITH GFR
Anion gap: 11 (ref 5–15)
BUN: 11 mg/dL (ref 6–20)
CO2: 30 mmol/L (ref 22–32)
Calcium: 9.1 mg/dL (ref 8.9–10.3)
Chloride: 94 mmol/L — ABNORMAL LOW (ref 98–111)
Creatinine, Ser: 0.83 mg/dL (ref 0.61–1.24)
GFR, Estimated: >60 mL/min (ref >60)
Glucose, Bld: 91 mg/dL (ref 70–99)
Potassium: 3.3 mmol/L — ABNORMAL LOW (ref 3.5–5.1)
Sodium: 135 mmol/L (ref 135–145)

## 2024-03-07 LAB — URINALYSIS, COMPLETE (UACMP) WITH MICROSCOPIC
Bacteria, UA: NONE SEEN
Bilirubin Urine: NEGATIVE
Glucose, UA: NEGATIVE mg/dL
Hgb urine dipstick: NEGATIVE
Ketones, ur: NEGATIVE mg/dL
Nitrite: NEGATIVE
Protein, ur: NEGATIVE mg/dL
Specific Gravity, Urine: 1.01 (ref 1.005–1.030)
pH: 8 (ref 5.0–8.0)

## 2024-03-07 LAB — CBC
HCT: 41.5 % (ref 39.0–52.0)
Hemoglobin: 13.8 g/dL (ref 13.0–17.0)
MCH: 32 pg (ref 26.0–34.0)
MCHC: 33.3 g/dL (ref 30.0–36.0)
MCV: 96.3 fL (ref 80.0–100.0)
Platelets: 65 K/uL — ABNORMAL LOW (ref 150–400)
RBC: 4.31 MIL/uL (ref 4.22–5.81)
RDW: 15.2 % (ref 11.5–15.5)
WBC: 5 K/uL (ref 4.0–10.5)
nRBC: 0 % (ref 0.0–0.2)

## 2024-03-07 LAB — MAGNESIUM: Magnesium: 2.1 mg/dL (ref 1.7–2.4)

## 2024-03-07 LAB — VITAMIN B12: Vitamin B-12: 756 pg/mL (ref 180–914)

## 2024-03-07 LAB — AMMONIA: Ammonia: 30 umol/L (ref 9–35)

## 2024-03-07 LAB — FOLATE: Folate: 10.5 ng/mL (ref >5.9)

## 2024-03-07 LAB — HIV ANTIBODY (ROUTINE TESTING W REFLEX): HIV Screen 4th Generation wRfx: NONREACTIVE

## 2024-03-07 MED ORDER — MIDODRINE HCL 5 MG PO TABS
5.0000 mg | ORAL_TABLET | Freq: Three times a day (TID) | ORAL | Status: DC
  Administered 2024-03-07 – 2024-03-10 (×10): 5 mg via ORAL
  Filled 2024-03-07 (×10): qty 1

## 2024-03-07 MED ORDER — SERTRALINE HCL 25 MG PO TABS
25.0000 mg | ORAL_TABLET | Freq: Every day | ORAL | Status: DC
  Administered 2024-03-07 – 2024-03-10 (×4): 25 mg via ORAL
  Filled 2024-03-07 (×4): qty 1

## 2024-03-07 MED ORDER — LACTULOSE 10 GM/15ML PO SOLN
10.0000 g | Freq: Three times a day (TID) | ORAL | Status: DC
  Administered 2024-03-07 – 2024-03-10 (×10): 10 g via ORAL
  Filled 2024-03-07 (×10): qty 15

## 2024-03-07 MED ORDER — POTASSIUM CHLORIDE 20 MEQ PO PACK
60.0000 meq | PACK | Freq: Once | ORAL | Status: AC
  Administered 2024-03-07: 60 meq via ORAL
  Filled 2024-03-07: qty 3

## 2024-03-07 MED ORDER — NICOTINE 21 MG/24HR TD PT24
21.0000 mg | MEDICATED_PATCH | Freq: Every day | TRANSDERMAL | Status: DC
  Administered 2024-03-07 – 2024-03-10 (×4): 21 mg via TRANSDERMAL
  Filled 2024-03-07 (×4): qty 1

## 2024-03-07 MED ORDER — POTASSIUM CHLORIDE CRYS ER 20 MEQ PO TBCR
40.0000 meq | EXTENDED_RELEASE_TABLET | Freq: Once | ORAL | Status: AC
  Administered 2024-03-07: 40 meq via ORAL
  Filled 2024-03-07: qty 2

## 2024-03-07 MED ORDER — ALLOPURINOL 100 MG PO TABS
100.0000 mg | ORAL_TABLET | Freq: Every day | ORAL | Status: DC
  Administered 2024-03-07 – 2024-03-10 (×4): 100 mg via ORAL
  Filled 2024-03-07 (×4): qty 1

## 2024-03-07 MED ORDER — QUETIAPINE FUMARATE 100 MG PO TABS
200.0000 mg | ORAL_TABLET | Freq: Every day | ORAL | Status: DC
  Administered 2024-03-07 – 2024-03-09 (×3): 200 mg via ORAL
  Filled 2024-03-07 (×3): qty 2

## 2024-03-07 NOTE — Consult Note (Signed)
 NEUROLOGY CONSULT NOTE   Date of service: March 07, 2024 Patient Name: [name] MRN:  [id number] DOB:  04-09-1971 Chief Complaint: seizures Requesting Provider: Sebastian Toribio GAILS, MD  History of Present Illness  [name] is a 53 y.o. male with hx of PTSD, schizoaffective disorder, ETOH withdrawal seizures on Keppra , ETOH abuse, anxiety and depression, alcoholic cirrhosis and GERD who presented to the ED via EMs for witnessed seizure activity lasting a few minutes while at the grocery store. He reports his last drink was 3-5 days ago and that he has been trying to stop drinking. He drinks beers with high EtOH content that he estimates as about 16%, about 4 total per day, typically, but also binge drinks until passing out. He also endorses nausea and vomiting as well.  Per chart review he was started on Keppra  500mg  BID in February 2023 during an admission for alcohol  withdrawal seizures and has been continued since. He does not appear to have had any follow up since that time. He states that he has had about 5 seizures in the past, one of which was after being abstinent from EtOH for 2.5 years.     ROS  As per HPI.   Past History   Past Medical History:  Diagnosis Date   Alcohol  abuse    Anxiety    at age 17   Blood transfusion without reported diagnosis    Cirrhosis (HCC)    Depression    at age 5   GERD (gastroesophageal reflux disease)    Hypertension    Nausea and vomiting    Psychosis (HCC)    PTSD (post-traumatic stress disorder)    Schizoaffective disorder    Seizure disorder (HCC)    related to etoh seizure   Symptomatic anemia   Epileptic seizure not associated with EtOH withdrawal  Past Surgical History:  Procedure Laterality Date   ESOPHAGOGASTRODUODENOSCOPY (EGD) WITH PROPOFOL  N/A 05/05/2020   Procedure: ESOPHAGOGASTRODUODENOSCOPY (EGD) WITH PROPOFOL ;  Surgeon: Shila Gustav GAILS, MD;  Location: [facility];  Service: Endoscopy;  Laterality: N/A;    FOOT SURGERY     right   HAND SURGERY      Family History: Family History  Problem Relation Age of Onset   Alcoholism Mother    CAD Father    Depression Brother    Colon polyps Neg Hx    Esophageal cancer Neg Hx    Pancreatic cancer Neg Hx    Stomach cancer Neg Hx     Social History  reports that he has been smoking cigarettes. He has a 45 pack-year smoking history. He has never used smokeless tobacco. He reports current alcohol  use. He reports current drug use. Frequency: 1.00 time per week. Drug: Marijuana.  Allergies  Allergen Reactions   Bupropion Other (See Comments)    Caused seizures, Feel as though I will have a seizure when taking this med    Medications   Current Facility-Administered Medications:    albuterol  (PROVENTIL ) (2.5 MG/3ML) 0.083% nebulizer solution 2.5 mg, 2.5 mg, Nebulization, Q2H PRN, Zella, Mir M, MD   allopurinol  (ZYLOPRIM ) tablet 100 mg, 100 mg, Oral, Daily, Sebastian Toribio GAILS, MD, 100 mg at 03/07/24 9083   Chlorhexidine  Gluconate Cloth 2 % PADS 6 each, 6 each, Topical, QHS, Zella, Mir M, MD, 6 each at 03/06/24 2158   enoxaparin  (LOVENOX ) injection 40 mg, 40 mg, Subcutaneous, Daily, Zella, Mir M, MD, 40 mg at 03/07/24 0919   folic acid  (FOLVITE ) tablet 1 mg, 1 mg, Oral,  Daily, Zella, Mir M, MD, 1 mg at 03/07/24 9083   gabapentin  (NEURONTIN ) capsule 300 mg, 300 mg, Oral, BID, Ikramullah, Mir M, MD, 300 mg at 03/07/24 0915   ibuprofen  (ADVIL ) tablet 400 mg, 400 mg, Oral, Q6H PRN, Zella, Mir M, MD, 400 mg at 03/07/24 9083   lactulose  (CHRONULAC ) 10 GM/15ML solution 10 g, 10 g, Oral, TID, Sebastian Toribio GAILS, MD, 10 g at 03/07/24 0915   levETIRAcetam  (KEPPRA ) tablet 500 mg, 500 mg, Oral, BID, Ikramullah, Mir M, MD, 500 mg at 03/07/24 9083   LORazepam  (ATIVAN ) tablet 1-4 mg, 1-4 mg, Oral, Q1H PRN, Zella, Mir M, MD, 1 mg at 03/07/24 9085   midodrine  (PROAMATINE ) tablet 5 mg, 5 mg, Oral, TID WC, Sebastian Toribio GAILS, MD    multivitamin with minerals tablet 1 tablet, 1 tablet, Oral, Daily, Zella, Mir M, MD, 1 tablet at 03/07/24 9083   nicotine  (NICODERM CQ  - dosed in mg/24 hours) patch 21 mg, 21 mg, Transdermal, Daily, Sebastian Toribio GAILS, MD, 21 mg at 03/07/24 0920   ondansetron  (ZOFRAN ) tablet 4 mg, 4 mg, Oral, Q6H PRN **OR** ondansetron  (ZOFRAN ) injection 4 mg, 4 mg, Intravenous, Q6H PRN, Zella, Mir M, MD   Oral care mouth rinse, 15 mL, Mouth Rinse, PRN, Zella, Mir M, MD   pantoprazole  (PROTONIX ) EC tablet 40 mg, 40 mg, Oral, Daily, Zella, Mir M, MD, 40 mg at 03/07/24 0915   PHENObarbital  (LUMINAL) injection 97.5 mg, 97.5 mg, Intravenous, Q8H, 97.5 mg at 03/07/24 0641 **FOLLOWED BY** [START ON 03/08/2024] PHENObarbital  (LUMINAL) injection 65 mg, 65 mg, Intravenous, Q8H **FOLLOWED BY** [START ON 03/10/2024] PHENObarbital  (LUMINAL) injection 32.5 mg, 32.5 mg, Intravenous, Q8H, Ikramullah, Mir M, MD   QUEtiapine  (SEROQUEL ) tablet 200 mg, 200 mg, Oral, QHS, Sebastian Toribio GAILS, MD   sertraline  (ZOLOFT ) tablet 25 mg, 25 mg, Oral, Daily, Sebastian Toribio GAILS, MD, 25 mg at 03/07/24 0915   thiamine  (VITAMIN B1) tablet 100 mg, 100 mg, Oral, Daily, Zella, Mir M, MD, 100 mg at 03/07/24 0916   traZODone  (DESYREL ) tablet 50 mg, 50 mg, Oral, QHS PRN, Zella, Mir M, MD  Vitals   Vitals:   03/07/24 0500 03/07/24 0600 03/07/24 0800 03/07/24 0820  BP: 99/67 106/74  (!) 163/140  Pulse: 73 71 65 87  Resp: 17 15    Temp:   98.1 F (36.7 C)   TempSrc:   Oral   SpO2: 98% 96%    Weight:      Height:        Body mass index is 29.16 kg/m.   Physical Exam   Constitutional: Appears well-developed and well-nourished.  Psych: Affect appropriate to situation.  Eyes: No scleral injection.  HENT: No OP obstruction.  Head: Normocephalic.  Respiratory: Effort normal, non-labored breathing.    Neurologic Examination  Mental Status: Awake, alert and oriented. Speech fluent with intact naming and  comprehension.  Cranial Nerves: II: Fixates and tracks normally. .   III,IV, VI: No ptosis. EOMI.  VII: Smile symmetric VIII: Hearing intact to conversation IX,X: No hoarseness or hypophonia XI: Symmetric XII: No lingual dysarthria Motor: No focal motor deficits noted.  Cerebellar: No ataxia noted Gait: Deferred  Labs/Imaging/Neurodiagnostic studies   CBC:  Recent Labs  Lab Mar 10, 2024 1515 03/07/24 0308  WBC 6.7 5.0  NEUTROABS 5.0  --   HGB 15.0 13.8  HCT 43.2 41.5  MCV 94.3 96.3  PLT 75* 65*   Basic Metabolic Panel:  Lab Results  Component Value Date   NA 135 03/07/2024  K 2.9 (L) 03/07/2024   CO2 28 03/07/2024   GLUCOSE 93 03/07/2024   BUN 9 03/07/2024   CREATININE 0.69 03/07/2024   CALCIUM  8.5 (L) 03/07/2024   GFRNONAA >60 03/07/2024   GFRAA 88 09/20/2020   Lipid Panel:  Lab Results  Component Value Date   LDLCALC 84 03/02/2019   HgbA1c:  Lab Results  Component Value Date   HGBA1C 5.5 09/07/2021   Urine Drug Screen:     Component Value Date/Time   LABOPIA NONE DETECTED 01/08/2024 0347   COCAINSCRNUR NONE DETECTED 01/08/2024 0347   LABBENZ NONE DETECTED 01/08/2024 0347   AMPHETMU NONE DETECTED 01/08/2024 0347   THCU POSITIVE (A) 01/08/2024 0347   LABBARB NONE DETECTED 01/08/2024 0347    Alcohol  Level     Component Value Date/Time   ETH <15 03/06/2024 1515   INR  Lab Results  Component Value Date   INR 1.1 01/08/2024   APTT  Lab Results  Component Value Date   APTT 33 03/05/2023   AED levels:  Lab Results  Component Value Date   LEVETIRACETA 44.1 (H) 01/08/2024    CT Head without contrast: No acute process   rEEG:  Official read is pending    ASSESSMENT  [name] is a 53 y.o. male  hx of PTSD, schizoaffective disorder, ETOH withdrawal seizures on Keppra , ETOH abuse, anxiety and depression, alcoholic cirrhosis and GERD who presented to the ED via EMs for witnessed seizure activity lasting a few minutes while at the  grocery store. His last drink was 3-5 days ago. Per chart review he was started on Keppra  500mg  BID in February 2023 during an admission for alcohol  withdrawal seizures and has been continued since. He does not appear to have had any follow up since that time. He states that he has had about 5 seizures in the past, one of which was after being abstinent from EtOH for 2.5 years.  - Exam is unremarkable - CT head: Normal - EEG: Completed with official read pending - Impression: EtOH withdrawal seizure versus recurrence of epileptic seizure  RECOMMENDATIONS  - Inpatient seizure precautions  - CIWA protocol  - Multivitamin, folate and thiamine  daily - Replace electrolytes per primary team  - Continue Keppra  at 500 mg BID indefinitely - Outpatient Neurology follow up - Alcohol  cessation coaching provided. May benefit from a 12-step program.  - Outpatient seizure precautions: Per [place] statutes, patients with seizures are not allowed to drive until  they have been seizure-free for six months. Use caution when using heavy equipment or power tools. Avoid working on ladders or at heights. Take showers instead of baths. Ensure the water  temperature is not too high on the home water  heater. Do not go swimming alone. When caring for infants or small children, sit down when holding, feeding, or changing them to minimize risk of injury to the child in the event you have a seizure. Also, Maintain good sleep hygiene. Avoid alcohol . - Neurohospitalist service will follow up on EEG results. If unremarkable, we will follow PRN.  - Outpatient Neurology follow up.   ______________________________________________________________________   Signed, Karna DELENA Geralds, NP [facility]  I have seen and examined the patient. I have formulated the assessment and recommendations.  53 y.o. male  hx of PTSD, schizoaffective disorder, ETOH withdrawal seizures on Keppra , ETOH abuse, anxiety and  depression, alcoholic cirrhosis and GERD who presented to the ED via EMs for witnessed seizure activity lasting a few minutes while at the grocery  store. His last drink was 3-5 days ago. Per chart review he was started on Keppra  500mg  BID in February 2023 during an admission for alcohol  withdrawal seizures and has been continued since. He does not appear to have had any follow up since that time. He states that he has had about 5 seizures in the past, one of which was after being abstinent from EtOH for 2.5 years. Neurological exam is unremarkable. Recommendations as above.  Electronically signed: Dr. Emalie Mcwethy

## 2024-03-07 NOTE — Plan of Care (Signed)
  Problem: Clinical Measurements: Goal: Respiratory complications will improve Outcome: Progressing Goal: Cardiovascular complication will be avoided Outcome: Progressing   Problem: Nutrition: Goal: Adequate nutrition will be maintained Outcome: Progressing   Problem: Coping: Goal: Level of anxiety will decrease Outcome: Progressing   Problem: Pain Managment: Goal: General experience of comfort will improve and/or be controlled Outcome: Progressing

## 2024-03-07 NOTE — Progress Notes (Signed)
EEG complete. Results pending.  ?

## 2024-03-07 NOTE — Progress Notes (Signed)
 PROGRESS NOTE    [name]  FMW:[id number] DOB: 06/04/1971 DOA: 03/06/2024 PCP: Tanda Bleacher, MD    Chief Complaint  Patient presents with   Seizures    Brief Narrative:  Patient 53 year old gentleman history of PTSD, schizoaffective disorder, alcohol -related liver cirrhosis, history of alcohol  withdrawal seizures presenting to the hospital with witnessed seizure with concerns for alcohol  withdrawal seizures.  Patient also noted to be on Keppra  prior to admission.   Assessment & Plan:   Principal Problem:   Seizure (HCC) Active Problems:   Hypokalemia   PTSD (post-traumatic stress disorder)   Hypomagnesemia   Hepatic cirrhosis (HCC)   Alcohol  withdrawal seizure (HCC)   GERD (gastroesophageal reflux disease)   Gout   Schizoaffective disorder (HCC)  #1 seizures versus alcohol  withdrawal seizures - patient noted to have a witnessed tonic-clonic seizure at the [employer] with concerns secondary to alcohol  withdrawal seizures. - Patient states to me that last alcoholic drink was 5 to 6 days before presentation however did tell admitting physician it was 2 to 3 days prior to that. - Patient does endorse some nausea and emesis for the past 3 to 4 days. - Patient states has been compliant with Keppra  which she was placed on. - In review of med history with pharmacist patient noted to have gotten 1 months worth of Keppra  in May 2025 with no refills. - Patient with no acute infection noted on workup. - Patient with significant electrolyte abnormalities with hypomagnesemia and hypokalemia on presentation with a potassium as low as 2.4 and magnesium  of 1.1. -Check a EEG. - Replete electrolytes to keep potassium approximately 4, magnesium  approximately 2. - Continue current dose of Keppra . - Continue current phenobarbital  taper, p.o. Ativan  per CIWA protocol. - IV fluids, supportive care - Seizure precautions. - Consult with neurology for further evaluation and management.  2.   Alcohol  withdrawal -Per admitting physician patient stated last alcohol  was 2 to 3 days prior to admission however he was not sure today patient tells me last alcohol  drink was 5 to 6 days prior to admission. - Patient with no further seizures noted during the hospitalization. - Continue thiamine , multivitamin, folic acid . - Continue phenobarbital  taper and p.o. Ativan  per CIWA protocol.  3.  Severe hypokalemia/hypomagnesemia -Likely secondary to GI losses from recent nausea vomiting and diarrhea from lactulose  use (??  Compliance) as well as probable nutritional deficiency. - Electrolytes being repleted. - Repeat BMP, magnesium  level at 1 PM. - Goal to keep potassium approximately 4, magnesium  approximately 2.  4.  Chronic pain/neuropathy -Resume home regimen gabapentin .  5.  GERD -PPI.  6.  History of gout -Resume home regimen allopurinol .  7.  History of liver cirrhosis -Continue midodrine . -Resume home regimen lactulose . - May consider resuming Toprol -XL and Aldactone  if BP remains stable. - Check an ammonia level. - Will need outpatient follow-up with PCP versus GI.  8.  Schizoaffective disorder -Resume home regimen Zoloft , Seroquel .    DVT prophylaxis: Lovenox  Code Status: Full Family Communication: Updated patient.  No family at bedside. Disposition: TBD  Status is: Observation The patient remains OBS appropriate and will d/c before 2 midnights.   Consultants:  None  Procedures:  CT head 03/06/2024 Chest x-ray 03/06/2024   Antimicrobials:  Anti-infectives (From admission, onward)    None         Subjective: Patient laying in bed asleep and easily arousable.  Denies any chest pain.  Does complain of some shortness of breath.  Does complain of some  right-sided abdominal discomfort chronic and ongoing.  Noted prior to admission to have nausea vomiting x 3 to 4 days.  Does endorse diarrhea however on chronic lactulose .  States he feels a little bit foggy.   States having some difficulty with his thoughts.  Unsure necessarily why he is on Keppra  however states he was discharged on it from prior hospitalizations.  Objective: Vitals:   03/07/24 0500 03/07/24 0600 03/07/24 0800 03/07/24 0820  BP: 99/67 106/74  (!) 163/140  Pulse: 73 71 65 87  Resp: 17 15    Temp:   98.1 F (36.7 C)   TempSrc:   Oral   SpO2: 98% 96%    Weight:      Height:        Intake/Output Summary (Last 24 hours) at 03/07/2024 0929 Last data filed at 03/07/2024 0800 Gross per 24 hour  Intake 518.36 ml  Output 1200 ml  Net -681.64 ml   Filed Weights   03/06/24 1459  Weight: 97.5 kg    Examination:  General exam: Appears calm and comfortable.  Minimal tremors. Respiratory system: Some scattered coarse breath sounds.  Minimal expiratory wheezing.  No crackles.  Fair air movement.  No use of accessory muscles of respiration.  Speaking in full sentences.  Cardiovascular system: S1 & S2 heard, RRR. No JVD, murmurs, rubs, gallops or clicks. No pedal edema. Gastrointestinal system: Abdomen is nondistended, soft and some tenderness to palpation in the right upper right mid abdominal region.  Hepatomegaly.  Positive bowel sounds.  No rebound.  No guarding.  Central nervous system: Alert and oriented. No focal neurological deficits. Extremities: Symmetric 5 x 5 power. Skin: No rashes, lesions or ulcers Psychiatry: Judgement and insight appear normal. Mood & affect appropriate.     Data Reviewed: I have personally reviewed following labs and imaging studies  CBC: Recent Labs  Lab 03/06/24 1515 03/07/24 0308  WBC 6.7 5.0  NEUTROABS 5.0  --   HGB 15.0 13.8  HCT 43.2 41.5  MCV 94.3 96.3  PLT 75* 65*    Basic Metabolic Panel: Recent Labs  Lab 03/06/24 1515 03/06/24 1546 03/07/24 0308  NA 132*  --  135  K 2.4*  --  2.9*  CL 85*  --  94*  CO2 27  --  28  GLUCOSE 127*  --  93  BUN 12  --  9  CREATININE 0.92  --  0.69  CALCIUM  9.0  --  8.5*  MG  --  1.1*   --     GFR: Estimated Creatinine Clearance: 132.3 mL/min (by C-G formula based on SCr of 0.69 mg/dL).  Liver Function Tests: Recent Labs  Lab 03/06/24 1515 03/07/24 0308  AST 61* 48*  ALT 20 19  ALKPHOS 108 91  BILITOT 4.3* 3.8*  PROT 7.1 6.3*  ALBUMIN  4.0 3.4*    CBG: Recent Labs  Lab 03/06/24 1508  GLUCAP 142*     Recent Results (from the past 240 hours)  MRSA Next Gen by PCR, Nasal     Status: None   Collection Time: 03/06/24  8:06 PM   Specimen: Nasal Mucosa; Nasal Swab  Result Value Ref Range Status   MRSA by PCR Next Gen NOT DETECTED NOT DETECTED Final    Comment: (NOTE) The GeneXpert MRSA Assay (FDA approved for NASAL specimens only), is one component of a comprehensive MRSA colonization surveillance program. It is not intended to diagnose MRSA infection nor to guide or monitor treatment for MRSA infections. Test  performance is not FDA approved in patients less than 30 years old. Performed at [facility], [address] [place], [place] [address]          Radiology Studies: DG Chest Portable 1 View Result Date: 03/06/2024 CLINICAL DATA:  Chest pain.  Witnessed seizure. EXAM: PORTABLE CHEST 1 VIEW COMPARISON:  Chest radiograph 01/08/2024 FINDINGS: The cardiomediastinal contours are normal. The lungs are clear. Pulmonary vasculature is normal. No consolidation, pleural effusion, or pneumothorax. Again seen remote right rib fracture. No acute osseous abnormalities are seen. IMPRESSION: No acute chest findings. Electronically Signed   By: Andrea Gasman M.D.   On: 03/06/2024 16:43   CT Head Wo Contrast Result Date: 03/06/2024 CLINICAL DATA:  Provided history: Head trauma, coagulopathy (Age 23-64y) Witnessed seizure at fluid line.  History of seizures. EXAM: CT HEAD WITHOUT CONTRAST TECHNIQUE: Contiguous axial images were obtained from the base of the skull through the vertex without intravenous contrast. RADIATION DOSE REDUCTION: This  exam was performed according to the departmental dose-optimization program which includes automated exposure control, adjustment of the mA and/or kV according to patient size and/or use of iterative reconstruction technique. COMPARISON:  Head CT 01/08/2024 FINDINGS: Brain: No intracranial hemorrhage, mass effect, or midline shift. Stable atrophy. No hydrocephalus. The basilar cisterns are patent. No evidence of territorial infarct or acute ischemia. No extra-axial or intracranial fluid collection. Vascular: No hyperdense vessel or unexpected calcification. Skull: No fracture or focal lesion. Sinuses/Orbits: Paranasal sinuses and mastoid air cells are clear. The visualized orbits are unremarkable. Other: No confluent scalp hematoma. IMPRESSION: 1. No acute intracranial abnormality. No skull fracture. 2. Stable atrophy. Electronically Signed   By: Andrea Gasman M.D.   On: 03/06/2024 16:42        Scheduled Meds:  allopurinol   100 mg Oral Daily   Chlorhexidine  Gluconate Cloth  6 each Topical QHS   enoxaparin  (LOVENOX ) injection  40 mg Subcutaneous Daily   folic acid   1 mg Oral Daily   gabapentin   300 mg Oral BID   lactulose   10 g Oral TID   levETIRAcetam   500 mg Oral BID   midodrine   5 mg Oral TID WC   multivitamin with minerals  1 tablet Oral Daily   nicotine   21 mg Transdermal Daily   pantoprazole   40 mg Oral Daily   PHENObarbital   97.5 mg Intravenous Q8H   Followed by   NOREEN ON 03/08/2024] PHENObarbital   65 mg Intravenous Q8H   Followed by   NOREEN ON 03/10/2024] PHENObarbital   32.5 mg Intravenous Q8H   QUEtiapine   200 mg Oral QHS   sertraline   25 mg Oral Daily   thiamine   100 mg Oral Daily   Continuous Infusions:   LOS: 0 days    Time spent: 45 minutes    Toribio Hummer, MD [facility]   To contact the attending provider between 7A-7P or the covering provider during after hours 7P-7A, please log into the web site [email or web address] and access using universal [place]  password for that web site. If you do not have the password, please call the hospital operator.  03/07/2024, 9:29 AM

## 2024-03-07 NOTE — Procedures (Signed)
 Patient Name: [name]  MRN: [id number]  Epilepsy Attending: Arlin MALVA Krebs  Referring Physician/Provider: Sebastian Toribio GAILS, MD  Date: 03/07/2024 Duration: 29.21 mins  Patient history: 53 y.o. male with medical history significant for PTSD, schizoaffective disorder, alcohol  related liver cirrhosis and history of withdrawal seizures being admitted to the hospital with witnessed alcohol  withdrawal seizure. EEG to evaluate for seizure  Level of alertness: Awake  AEDs during EEG study: GBP, Phenobarb, LEV, Ativan   Technical aspects: This EEG study was done with scalp electrodes positioned according to the 10-20 International system of electrode placement. Electrical activity was reviewed with band pass filter of 1-70Hz , sensitivity of 7 uV/mm, display speed of 16mm/sec with a 60Hz  notched filter applied as appropriate. EEG data were recorded continuously and digitally stored.  Video monitoring was available and reviewed as appropriate.  Description: The posterior dominant rhythm consists of 8-9 Hz activity of moderate voltage (25-35 uV) seen predominantly in posterior head regions, symmetric and reactive to eye opening and eye closing. There is an excessive amount of 15 to 18 Hz beta activity distributed symmetrically and diffusely. Physiologic photic driving was seen during photic stimulation.  Hyperventilation was not performed.     ABNORMALITY - Excessive beta, generalized  IMPRESSION: This study is within normal limits. The excessive beta activity seen in the background is most likely due to the effect of benzodiazepine and is a benign EEG pattern. No seizures or epileptiform discharges were seen throughout the recording.  A normal interictal EEG does not exclude the diagnosis of epilepsy.   Zariya Minner O Hisashi Amadon

## 2024-03-08 ENCOUNTER — Encounter (HOSPITAL_COMMUNITY): Payer: Self-pay | Admitting: Internal Medicine

## 2024-03-08 ENCOUNTER — Inpatient Hospital Stay (HOSPITAL_COMMUNITY)

## 2024-03-08 DIAGNOSIS — E876 Hypokalemia: Secondary | ICD-10-CM | POA: Diagnosis not present

## 2024-03-08 DIAGNOSIS — F10930 Alcohol use, unspecified with withdrawal, uncomplicated: Secondary | ICD-10-CM | POA: Diagnosis not present

## 2024-03-08 DIAGNOSIS — R569 Unspecified convulsions: Secondary | ICD-10-CM | POA: Diagnosis not present

## 2024-03-08 LAB — URINE CULTURE: Culture: NO GROWTH

## 2024-03-08 LAB — CBC WITH DIFFERENTIAL/PLATELET
Abs Immature Granulocytes: 0.02 K/uL (ref 0.00–0.07)
Basophils Absolute: 0 K/uL (ref 0.0–0.1)
Basophils Relative: 0 %
Eosinophils Absolute: 0.1 K/uL (ref 0.0–0.5)
Eosinophils Relative: 3 %
HCT: 39.5 % (ref 39.0–52.0)
Hemoglobin: 13.1 g/dL (ref 13.0–17.0)
Immature Granulocytes: 0 %
Lymphocytes Relative: 34 %
Lymphs Abs: 1.7 K/uL (ref 0.7–4.0)
MCH: 32.1 pg (ref 26.0–34.0)
MCHC: 33.2 g/dL (ref 30.0–36.0)
MCV: 96.8 fL (ref 80.0–100.0)
Monocytes Absolute: 0.5 K/uL (ref 0.1–1.0)
Monocytes Relative: 9 %
Neutro Abs: 2.6 K/uL (ref 1.7–7.7)
Neutrophils Relative %: 54 %
Platelets: 55 K/uL — ABNORMAL LOW (ref 150–400)
RBC: 4.08 MIL/uL — ABNORMAL LOW (ref 4.22–5.81)
RDW: 15.3 % (ref 11.5–15.5)
WBC: 4.9 K/uL (ref 4.0–10.5)
nRBC: 0 % (ref 0.0–0.2)

## 2024-03-08 LAB — COMPREHENSIVE METABOLIC PANEL WITH GFR
ALT: 26 U/L (ref 0–44)
AST: 62 U/L — ABNORMAL HIGH (ref 15–41)
Albumin: 3.2 g/dL — ABNORMAL LOW (ref 3.5–5.0)
Alkaline Phosphatase: 88 U/L (ref 38–126)
Anion gap: 10 (ref 5–15)
BUN: 15 mg/dL (ref 6–20)
CO2: 26 mmol/L (ref 22–32)
Calcium: 8.8 mg/dL — ABNORMAL LOW (ref 8.9–10.3)
Chloride: 98 mmol/L (ref 98–111)
Creatinine, Ser: 0.77 mg/dL (ref 0.61–1.24)
GFR, Estimated: >60 mL/min (ref >60)
Glucose, Bld: 95 mg/dL (ref 70–99)
Potassium: 3.2 mmol/L — ABNORMAL LOW (ref 3.5–5.1)
Sodium: 134 mmol/L — ABNORMAL LOW (ref 135–145)
Total Bilirubin: 2.7 mg/dL — ABNORMAL HIGH (ref 0.0–1.2)
Total Protein: 6.3 g/dL — ABNORMAL LOW (ref 6.5–8.1)

## 2024-03-08 LAB — MAGNESIUM: Magnesium: 1.8 mg/dL (ref 1.7–2.4)

## 2024-03-08 LAB — LEVETIRACETAM LEVEL: Levetiracetam Lvl: 15.8 ug/mL (ref 10.0–40.0)

## 2024-03-08 MED ORDER — POTASSIUM CHLORIDE CRYS ER 20 MEQ PO TBCR
40.0000 meq | EXTENDED_RELEASE_TABLET | ORAL | Status: AC
  Administered 2024-03-08 (×2): 40 meq via ORAL
  Filled 2024-03-08 (×2): qty 2

## 2024-03-08 MED ORDER — MAGNESIUM SULFATE 2 GM/50ML IV SOLN
2.0000 g | Freq: Once | INTRAVENOUS | Status: AC
  Administered 2024-03-08: 2 g via INTRAVENOUS
  Filled 2024-03-08: qty 50

## 2024-03-08 NOTE — Plan of Care (Signed)

## 2024-03-08 NOTE — Progress Notes (Signed)
 PROGRESS NOTE    [name]  FMW:[id number] DOB: 04-09-1971 DOA: 03/06/2024 PCP: Tanda Bleacher, MD    Chief Complaint  Patient presents with   Seizures    Brief Narrative:  Patient 53 year old gentleman history of PTSD, schizoaffective disorder, alcohol -related liver cirrhosis, history of alcohol  withdrawal seizures presenting to the hospital with witnessed seizure with concerns for alcohol  withdrawal seizures.  Patient also noted to be on Keppra  prior to admission.  Patient seen in consultation by neurology.   Assessment & Plan:   Principal Problem:   Seizure (HCC) Active Problems:   Hypokalemia   PTSD (post-traumatic stress disorder)   Hypomagnesemia   Hepatic cirrhosis (HCC)   Alcohol  withdrawal seizure (HCC)   GERD (gastroesophageal reflux disease)   Gout   Schizoaffective disorder (HCC)  #1 seizures versus alcohol  withdrawal seizures - patient noted to have a witnessed tonic-clonic seizure at the [employer] with concerns secondary to alcohol  withdrawal seizures. - Patient states to me that last alcoholic drink was 5 to 6 days before presentation however did tell admitting physician it was 2 to 3 days prior to that. - Patient did endorse some nausea and emesis for the past 3 to 4 days. - Patient states has been compliant with Keppra  which she was placed on in the past. - In review of med history with pharmacist patient noted to have gotten 1 months worth of Keppra  in May 2025 with no refills. -It is noted per neurology and patient was started on Keppra  500 mg twice daily in February 2023 during an admission for alcohol  withdrawal seizures which has been continued since and per patient being refilled by his PCP. - Patient with no acute infection noted on workup. - Patient with significant electrolyte abnormalities with hypomagnesemia and hypokalemia on presentation with a potassium as low as 2.4 and magnesium  of 1.1. -EEG pending. - Replete electrolytes to keep  potassium approximately 4, magnesium  approximately 2. - Continue current dose of Keppra . - Continue current phenobarbital  taper, p.o. Ativan  per CIWA protocol. - IV fluids, supportive care - Seizure precautions. -Patient seen in consultation by neurology who are recommending continuation of Keppra  with outpatient follow-up with neurology. - Consult with neurology for further evaluation and management.  - Outpatient seizure precautions: Per [place]  DMV statutes, patients with seizures are not allowed to drive until  they have been seizure-free for six months. Use caution when using heavy equipment or power tools. Avoid working on ladders or at heights. Take showers instead of baths. Ensure the water  temperature is not too high on the home water  heater. Do not go swimming alone. When caring for infants or small children, sit down when holding, feeding, or changing them to minimize risk of injury to the child in the event you have a seizure. Also, Maintain good sleep hygiene. Avoid alcohol .   2.  Alcohol  withdrawal -Per admitting physician patient stated last alcohol  was 2 to 3 days prior to admission however he was not sure today patient tells me last alcohol  drink was 5 to 6 days prior to admission. - Patient with no further seizures noted during the hospitalization. - Continue thiamine , multivitamin, folic acid . - Continue phenobarbital  taper and p.o. Ativan  per CIWA protocol.  3.  Severe hypokalemia/hypomagnesemia -Likely secondary to GI losses from recent nausea vomiting and diarrhea from lactulose  use (??  Compliance) as well as probable nutritional deficiency. - Electrolytes being repleted. - Potassium at 3.2 this morning, magnesium  at 1.8.   - K-Dur 40 mEq p.o. every 4  hours x 2 doses.   - Magnesium  2 g IV x 1.  - Goal to keep potassium approximately 4, magnesium  approximately 2. - Repeat labs in AM.  4.  Chronic pain/neuropathy - Continue home regimen gabapentin .  5.  GERD -  Continue PPI.  6.  History of gout - Continue home regimen allopurinol .  7.  History of liver cirrhosis -Continue midodrine . - Continue home regimen lactulose . - May consider resuming Toprol -XL and Aldactone  if BP remains stable. - Will need outpatient follow-up with PCP versus GI.  8.  Schizoaffective disorder - Continue Zoloft , Seroquel .    DVT prophylaxis: Lovenox  Code Status: Full Family Communication: Updated patient.  No family at bedside. Disposition: Home after completion of phenobarbital  taper and clinical improvement.   Status is: Inpatient    Consultants:  Neurology: Dr. Angeline 03/07/2024  Procedures:  CT head 03/06/2024 Chest x-ray 03/06/2024 EEG 03/07/2024  Antimicrobials:  Anti-infectives (From admission, onward)    None         Subjective: Patient sleeping but easily arousable.  Denies any chest pain or shortness of breath.  No abdominal pain.  No further seizures noted.  Patient still with a slight tremor.  Tolerating current diet.   Objective: Vitals:   03/08/24 0400 03/08/24 0500 03/08/24 0600 03/08/24 0800  BP: 116/72 (!) 88/56 109/80   Pulse: 84 68 72   Resp: (!) 22 16 15    Temp:    98.1 F (36.7 C)  TempSrc:    Oral  SpO2: 96% 94% 93%   Weight:      Height:        Intake/Output Summary (Last 24 hours) at 03/08/2024 1101 Last data filed at 03/08/2024 0836 Gross per 24 hour  Intake 240 ml  Output 1200 ml  Net -960 ml   Filed Weights   03/06/24 1459  Weight: 97.5 kg    Examination:  General exam: Appears calm and comfortable.  Minimal tremors. Respiratory system: CTAB anterior lung fields.  No wheezes, no crackles, no rhonchi.  Fair air movement.  Speaking in full sentences.   Cardiovascular system: Regular rate rhythm no murmurs rubs or gallops.  No JVD.  No lower extremity edema.  Gastrointestinal system: Abdomen is soft, nontender, nondistended, positive bowel sounds.  No rebound.  No guarding.  Hepatomegaly.  Central nervous  system: Alert and oriented. No focal neurological deficits. Extremities: Symmetric 5 x 5 power. Skin: No rashes, lesions or ulcers Psychiatry: Judgement and insight appear normal. Mood & affect appropriate.     Data Reviewed: I have personally reviewed following labs and imaging studies  CBC: Recent Labs  Lab 03/06/24 1515 03/07/24 0308 03/08/24 0327  WBC 6.7 5.0 4.9  NEUTROABS 5.0  --  2.6  HGB 15.0 13.8 13.1  HCT 43.2 41.5 39.5  MCV 94.3 96.3 96.8  PLT 75* 65* 55*    Basic Metabolic Panel: Recent Labs  Lab 03/06/24 1515 03/06/24 1546 03/07/24 0308 03/07/24 1345 03/08/24 0327  NA 132*  --  135 135 134*  K 2.4*  --  2.9* 3.3* 3.2*  CL 85*  --  94* 94* 98  CO2 27  --  28 30 26   GLUCOSE 127*  --  93 91 95  BUN 12  --  9 11 15   CREATININE 0.92  --  0.69 0.83 0.77  CALCIUM  9.0  --  8.5* 9.1 8.8*  MG  --  1.1*  --  2.1 1.8    GFR: Estimated Creatinine Clearance: 132.3 mL/min (  by C-G formula based on SCr of 0.77 mg/dL).  Liver Function Tests: Recent Labs  Lab 03/06/24 1515 03/07/24 0308 03/08/24 0327  AST 61* 48* 62*  ALT 20 19 26   ALKPHOS 108 91 88  BILITOT 4.3* 3.8* 2.7*  PROT 7.1 6.3* 6.3*  ALBUMIN  4.0 3.4* 3.2*    CBG: Recent Labs  Lab 03/06/24 1508  GLUCAP 142*     Recent Results (from the past 240 hours)  MRSA Next Gen by PCR, Nasal     Status: None   Collection Time: 03/06/24  8:06 PM   Specimen: Nasal Mucosa; Nasal Swab  Result Value Ref Range Status   MRSA by PCR Next Gen NOT DETECTED NOT DETECTED Final    Comment: (NOTE) The GeneXpert MRSA Assay (FDA approved for NASAL specimens only), is one component of a comprehensive MRSA colonization surveillance program. It is not intended to diagnose MRSA infection nor to guide or monitor treatment for MRSA infections. Test performance is not FDA approved in patients less than 38 years old. Performed at [facility], [address] [place], [place] [address]           Radiology Studies: DG CHEST PORT 1 VIEW Result Date: 03/08/2024 CLINICAL DATA:  Shortness of breath EXAM: PORTABLE CHEST 1 VIEW COMPARISON:  03/06/2024 FINDINGS: Mild central airway thickening. The lungs appear otherwise clear. Cardiac and mediastinal margins appear normal. No blunting of the costophrenic angles. IMPRESSION: 1. Mild central airway thickening, which may reflect bronchitis or reactive airways disease. Electronically Signed   By: Ryan Salvage M.D.   On: 03/08/2024 09:20   EEG adult Result Date: 03/07/2024 Shelton Arlin KIDD, MD     03/08/2024  8:31 AM Patient Name: [name] MRN: [id number] Epilepsy Attending: Arlin KIDD Shelton Referring Physician/Provider: Sebastian Toribio GAILS, MD Date: 03/07/2024 Duration: 29.21 mins Patient history: 53 y.o. male with medical history significant for PTSD, schizoaffective disorder, alcohol  related liver cirrhosis and history of withdrawal seizures being admitted to the hospital with witnessed alcohol  withdrawal seizure. EEG to evaluate for seizure Level of alertness: Awake AEDs during EEG study: GBP, Phenobarb, LEV, Ativan  Technical aspects: This EEG study was done with scalp electrodes positioned according to the 10-20 International system of electrode placement. Electrical activity was reviewed with band pass filter of 1-70Hz , sensitivity of 7 uV/mm, display speed of 61mm/sec with a 60Hz  notched filter applied as appropriate. EEG data were recorded continuously and digitally stored.  Video monitoring was available and reviewed as appropriate. Description: The posterior dominant rhythm consists of 8-9 Hz activity of moderate voltage (25-35 uV) seen predominantly in posterior head regions, symmetric and reactive to eye opening and eye closing. There is an excessive amount of 15 to 18 Hz beta activity distributed symmetrically and diffusely. Physiologic photic driving was seen during photic stimulation.  Hyperventilation was not performed.    ABNORMALITY - Excessive beta, generalized IMPRESSION: This study is within normal limits. The excessive beta activity seen in the background is most likely due to the effect of benzodiazepine and is a benign EEG pattern. No seizures or epileptiform discharges were seen throughout the recording. A normal interictal EEG does not exclude the diagnosis of epilepsy. Arlin KIDD Shelton   DG Chest Portable 1 View Result Date: 03/06/2024 CLINICAL DATA:  Chest pain.  Witnessed seizure. EXAM: PORTABLE CHEST 1 VIEW COMPARISON:  Chest radiograph 01/08/2024 FINDINGS: The cardiomediastinal contours are normal. The lungs are clear. Pulmonary vasculature is normal. No consolidation, pleural effusion, or pneumothorax. Again seen remote  right rib fracture. No acute osseous abnormalities are seen. IMPRESSION: No acute chest findings. Electronically Signed   By: Andrea Gasman M.D.   On: 03/06/2024 16:43   CT Head Wo Contrast Result Date: 03/06/2024 CLINICAL DATA:  Provided history: Head trauma, coagulopathy (Age 80-64y) Witnessed seizure at fluid line.  History of seizures. EXAM: CT HEAD WITHOUT CONTRAST TECHNIQUE: Contiguous axial images were obtained from the base of the skull through the vertex without intravenous contrast. RADIATION DOSE REDUCTION: This exam was performed according to the departmental dose-optimization program which includes automated exposure control, adjustment of the mA and/or kV according to patient size and/or use of iterative reconstruction technique. COMPARISON:  Head CT 01/08/2024 FINDINGS: Brain: No intracranial hemorrhage, mass effect, or midline shift. Stable atrophy. No hydrocephalus. The basilar cisterns are patent. No evidence of territorial infarct or acute ischemia. No extra-axial or intracranial fluid collection. Vascular: No hyperdense vessel or unexpected calcification. Skull: No fracture or focal lesion. Sinuses/Orbits: Paranasal sinuses and mastoid air cells are clear. The visualized  orbits are unremarkable. Other: No confluent scalp hematoma. IMPRESSION: 1. No acute intracranial abnormality. No skull fracture. 2. Stable atrophy. Electronically Signed   By: Andrea Gasman M.D.   On: 03/06/2024 16:42        Scheduled Meds:  allopurinol   100 mg Oral Daily   Chlorhexidine  Gluconate Cloth  6 each Topical QHS   enoxaparin  (LOVENOX ) injection  40 mg Subcutaneous Daily   folic acid   1 mg Oral Daily   gabapentin   300 mg Oral BID   lactulose   10 g Oral TID   levETIRAcetam   500 mg Oral BID   midodrine   5 mg Oral TID WC   multivitamin with minerals  1 tablet Oral Daily   nicotine   21 mg Transdermal Daily   pantoprazole   40 mg Oral Daily   PHENObarbital   97.5 mg Intravenous Q8H   Followed by   PHENObarbital   65 mg Intravenous Q8H   Followed by   NOREEN ON 03/10/2024] PHENObarbital   32.5 mg Intravenous Q8H   potassium chloride   40 mEq Oral Q4H   QUEtiapine   200 mg Oral QHS   sertraline   25 mg Oral Daily   thiamine   100 mg Oral Daily   Continuous Infusions:   LOS: 1 day    Time spent: 40 minutes    Toribio Hummer, MD [facility]   To contact the attending provider between 7A-7P or the covering provider during after hours 7P-7A, please log into the web site [email or web address] and access using universal [place] password for that web site. If you do not have the password, please call the hospital operator.  03/08/2024, 11:01 AM

## 2024-03-08 NOTE — Plan of Care (Signed)
   Problem: Education: Goal: Knowledge of General Education information will improve Description Including pain rating scale, medication(s)/side effects and non-pharmacologic comfort measures Outcome: Progressing   Problem: Health Behavior/Discharge Planning: Goal: Ability to manage health-related needs will improve Outcome: Progressing

## 2024-03-08 NOTE — Plan of Care (Signed)
  Problem: Clinical Measurements: Goal: Respiratory complications will improve Outcome: Progressing Goal: Cardiovascular complication will be avoided Outcome: Progressing   Problem: Activity: Goal: Risk for activity intolerance will decrease Outcome: Progressing   Problem: Nutrition: Goal: Adequate nutrition will be maintained Outcome: Progressing   Problem: Coping: Goal: Level of anxiety will decrease Outcome: Progressing   Problem: Elimination: Goal: Will not experience complications related to urinary retention Outcome: Progressing   Problem: Pain Managment: Goal: General experience of comfort will improve and/or be controlled Outcome: Progressing   Problem: Safety: Goal: Ability to remain free from injury will improve Outcome: Progressing   Problem: Skin Integrity: Goal: Risk for impaired skin integrity will decrease Outcome: Progressing

## 2024-03-09 DIAGNOSIS — F10939 Alcohol use, unspecified with withdrawal, unspecified: Secondary | ICD-10-CM | POA: Diagnosis not present

## 2024-03-09 DIAGNOSIS — E876 Hypokalemia: Secondary | ICD-10-CM | POA: Diagnosis not present

## 2024-03-09 DIAGNOSIS — F10930 Alcohol use, unspecified with withdrawal, uncomplicated: Secondary | ICD-10-CM | POA: Diagnosis not present

## 2024-03-09 LAB — CBC
HCT: 40.9 % (ref 39.0–52.0)
Hemoglobin: 12.9 g/dL — ABNORMAL LOW (ref 13.0–17.0)
MCH: 31.2 pg (ref 26.0–34.0)
MCHC: 31.5 g/dL (ref 30.0–36.0)
MCV: 98.8 fL (ref 80.0–100.0)
Platelets: 68 K/uL — ABNORMAL LOW (ref 150–400)
RBC: 4.14 MIL/uL — ABNORMAL LOW (ref 4.22–5.81)
RDW: 15.7 % — ABNORMAL HIGH (ref 11.5–15.5)
WBC: 4.8 K/uL (ref 4.0–10.5)
nRBC: 0 % (ref 0.0–0.2)

## 2024-03-09 LAB — HEPATIC FUNCTION PANEL
ALT: 33 U/L (ref 0–44)
AST: 76 U/L — ABNORMAL HIGH (ref 15–41)
Albumin: 3.4 g/dL — ABNORMAL LOW (ref 3.5–5.0)
Alkaline Phosphatase: 92 U/L (ref 38–126)
Bilirubin, Direct: 0.8 mg/dL — ABNORMAL HIGH (ref 0.0–0.2)
Indirect Bilirubin: 1 mg/dL — ABNORMAL HIGH (ref 0.3–0.9)
Total Bilirubin: 1.8 mg/dL — ABNORMAL HIGH (ref 0.0–1.2)
Total Protein: 6.7 g/dL (ref 6.5–8.1)

## 2024-03-09 LAB — RENAL FUNCTION PANEL
Albumin: 3.5 g/dL (ref 3.5–5.0)
Anion gap: 9 (ref 5–15)
BUN: 15 mg/dL (ref 6–20)
CO2: 23 mmol/L (ref 22–32)
Calcium: 9 mg/dL (ref 8.9–10.3)
Chloride: 101 mmol/L (ref 98–111)
Creatinine, Ser: 0.91 mg/dL (ref 0.61–1.24)
GFR, Estimated: >60 mL/min (ref >60)
Glucose, Bld: 91 mg/dL (ref 70–99)
Phosphorus: 3.7 mg/dL (ref 2.5–4.6)
Potassium: 4.7 mmol/L (ref 3.5–5.1)
Sodium: 133 mmol/L — ABNORMAL LOW (ref 135–145)

## 2024-03-09 LAB — MAGNESIUM: Magnesium: 1.9 mg/dL (ref 1.7–2.4)

## 2024-03-09 MED ORDER — ACETAMINOPHEN 325 MG PO TABS: 325.0000 mg | ORAL_TABLET | Freq: Four times a day (QID) | ORAL | Status: DC | PRN

## 2024-03-09 NOTE — Progress Notes (Addendum)
 PROGRESS NOTE   [name]  FMW:[id number]    DOB: 06-Mar-1971    DOA: 03/06/2024  PCP: Tanda Bleacher, MD   I have briefly reviewed patients previous medical records in [facility]   Brief Hospital Course:  53 year old gentleman history of PTSD, schizoaffective disorder, alcohol -related liver cirrhosis, history of alcohol  withdrawal seizures presenting to the hospital with witnessed seizure with concerns for alcohol  withdrawal seizures.  Patient also noted to be on Keppra  prior to admission.  Patient seen in consultation by neurology.    Assessment & Plan:   Suspected alcohol  withdrawal seizures Witnessed TC seizure at [employer] Per report, patient's last alcoholic drink was somewhere between 2 to 6 days PTA. He reported compliance with Keppra .  Continue current dose of Keppra  Also on phenobarbital  taper and as needed Ativan  per CIWA protocol Seizure precautions. EEG 7/21: Within normal limits. As per neurology consultation note from 7/21, continue Keppra  500 Mg twice daily indefinitely, outpatient neurology follow-up. Patient has been counseled on 7/23 that he must not drive until he is seizure-free for 6 months and other seizure precautions.  He verbalized understanding.  Alcohol  use disorder/alcohol  withdrawal Patient volunteers to drinking up to 5 bottles of malt liquor (states that it is 14% alcohol  in a bottle) PTA Had CIWA score of 13 last night but since then has improved down to 5 and 0 over the past 2 readings Continue phenobarbital  taper, Ativan  per CIWA protocol, thiamine  and vitamin supplements Currently without alcohol  withdrawal.  Severe hypokalemia/hypomagnesemia Replaced.  Thrombocytopenia Suspected due to bone marrow toxicity from alcohol  use disorder/alcoholic cirrhosis.  Stable.  Chronic pain/neuropathy Continue gabapentin   GERD Continue PPI  Gout No acute flare.  Continue allopurinol   Liver cirrhosis Continue home regimen of midodrine  for  hypotension, lactulose .  Outpatient follow-up with PCP or GI Currently off of Aldactone  and Toprol -XL, blood pressures okay and no clinical volume overload.  May defer to outpatient to resume  Schizoaffective disorder Continue Zoloft  and Seroquel .  Body mass index is 29.16 kg/m.   DVT prophylaxis: enoxaparin  (LOVENOX ) injection 40 mg Start: 03/06/24 1800     Code Status: Full Code:  Family Communication: None at bedside Disposition:  Status is: Inpatient Remains inpatient appropriate because: Monitoring for alcohol  withdrawal, on phenobarbital  taper.  If no further withdrawal in the next 24 hours, could consider DC home tomorrow.     Consultants:   Neurology  Procedures:     Subjective:  Denies complaints.  States that he just feels tired.  No pain reported.  Objective:   Vitals:   03/08/24 2000 03/08/24 2057 03/09/24 0429 03/09/24 1200  BP: 131/84 132/86 104/73 120/76  Pulse: 75 79 77 73  Resp:  16 16 16   Temp:  98 F (36.7 C) 97.6 F (36.4 C) 98.4 F (36.9 C)  TempSrc:    Oral  SpO2:  98% 97%   Weight:      Height:        General exam: Young male, moderately built and nourished lying comfortably propped up in bed without distress. Respiratory system: Clear to auscultation. Respiratory effort normal. Cardiovascular system: S1 & S2 heard, RRR. No JVD, murmurs, rubs, gallops or clicks. No pedal edema.  Telemetry personally reviewed: Sinus rhythm. Gastrointestinal system: Abdomen is nondistended, soft and nontender. No organomegaly or masses felt. Normal bowel sounds heard. Central nervous system: Alert and oriented. No focal neurological deficits. Extremities: Symmetric 5 x 5 power. Skin: No rashes, lesions or ulcers Psychiatry: Judgement and insight appear normal. Mood &  affect appropriate.     Data Reviewed:   I have personally reviewed following labs and imaging studies   CBC: Recent Labs  Lab 03/06/24 1515 03/07/24 0308 03/08/24 0327 03/09/24 0557   WBC 6.7 5.0 4.9 4.8  NEUTROABS 5.0  --  2.6  --   HGB 15.0 13.8 13.1 12.9*  HCT 43.2 41.5 39.5 40.9  MCV 94.3 96.3 96.8 98.8  PLT 75* 65* 55* 68*    Basic Metabolic Panel: Recent Labs  Lab 03/06/24 1515 03/06/24 1546 03/07/24 0308 03/07/24 1345 03/08/24 0327 03/09/24 0557  NA 132*  --  135 135 134* 133*  K 2.4*  --  2.9* 3.3* 3.2* 4.7  CL 85*  --  94* 94* 98 101  CO2 27  --  28 30 26 23   GLUCOSE 127*  --  93 91 95 91  BUN 12  --  9 11 15 15   CREATININE 0.92  --  0.69 0.83 0.77 0.91  CALCIUM  9.0  --  8.5* 9.1 8.8* 9.0  MG  --  1.1*  --  2.1 1.8 1.9  PHOS  --   --   --   --   --  3.7    Liver Function Tests: Recent Labs  Lab 03/06/24 1515 03/07/24 0308 03/08/24 0327 03/09/24 0557  AST 61* 48* 62* 76*  ALT 20 19 26  33  ALKPHOS 108 91 88 92  BILITOT 4.3* 3.8* 2.7* 1.8*  PROT 7.1 6.3* 6.3* 6.7  ALBUMIN  4.0 3.4* 3.2* 3.4*  3.5    CBG: Recent Labs  Lab 03/06/24 1508  GLUCAP 142*    Microbiology Studies:   Recent Results (from the past 240 hours)  MRSA Next Gen by PCR, Nasal     Status: None   Collection Time: 03/06/24  8:06 PM   Specimen: Nasal Mucosa; Nasal Swab  Result Value Ref Range Status   MRSA by PCR Next Gen NOT DETECTED NOT DETECTED Final    Comment: (NOTE) The GeneXpert MRSA Assay (FDA approved for NASAL specimens only), is one component of a comprehensive MRSA colonization surveillance program. It is not intended to diagnose MRSA infection nor to guide or monitor treatment for MRSA infections. Test performance is not FDA approved in patients less than 57 years old. Performed at [facility], [address], [place], [place] [address]   Urine Culture (for pregnant, neutropenic or urologic patients or patients with an indwelling urinary catheter)     Status: None   Collection Time: 03/07/24  9:19 AM   Specimen: Urine, Clean Catch  Result Value Ref Range Status   Specimen Description   Final    URINE, CLEAN  CATCH Performed at [facility], [address] [place], [place] [address]    Special Requests   Final    NONE Performed at [facility], [address] [place], [place] [address]    Culture   Final    NO GROWTH Performed at [facility], [address], [place], [place] [address]    Report Status 03/08/2024 FINAL  Final    Radiology Studies:  DG CHEST PORT 1 VIEW Result Date: 03/08/2024 CLINICAL DATA:  Shortness of breath EXAM: PORTABLE CHEST 1 VIEW COMPARISON:  03/06/2024 FINDINGS: Mild central airway thickening. The lungs appear otherwise clear. Cardiac and mediastinal margins appear normal. No blunting of the costophrenic angles. IMPRESSION: 1. Mild central airway thickening, which may reflect bronchitis or reactive airways disease. Electronically Signed  By: Ryan Salvage M.D.   On: 03/08/2024 09:20   EEG adult Result Date: 03/07/2024 Shelton Arlin KIDD, MD     03/08/2024  8:31 AM Patient Name: Adom Schoeneck MRN: [id number] Epilepsy Attending: Arlin KIDD Shelton Referring Physician/Provider: Sebastian Toribio GAILS, MD Date: 03/07/2024 Duration: 29.21 mins Patient history: 53 y.o. male with medical history significant for PTSD, schizoaffective disorder, alcohol  related liver cirrhosis and history of withdrawal seizures being admitted to the hospital with witnessed alcohol  withdrawal seizure. EEG to evaluate for seizure Level of alertness: Awake AEDs during EEG study: GBP, Phenobarb, LEV, Ativan  Technical aspects: This EEG study was done with scalp electrodes positioned according to the 10-20 International system of electrode placement. Electrical activity was reviewed with band pass filter of 1-70Hz , sensitivity of 7 uV/mm, display speed of 48mm/sec with a 60Hz  notched filter applied as appropriate. EEG data were recorded continuously and digitally stored.  Video monitoring was available and reviewed as appropriate. Description: The posterior dominant  rhythm consists of 8-9 Hz activity of moderate voltage (25-35 uV) seen predominantly in posterior head regions, symmetric and reactive to eye opening and eye closing. There is an excessive amount of 15 to 18 Hz beta activity distributed symmetrically and diffusely. Physiologic photic driving was seen during photic stimulation.  Hyperventilation was not performed.   ABNORMALITY - Excessive beta, generalized IMPRESSION: This study is within normal limits. The excessive beta activity seen in the background is most likely due to the effect of benzodiazepine and is a benign EEG pattern. No seizures or epileptiform discharges were seen throughout the recording. A normal interictal EEG does not exclude the diagnosis of epilepsy. Priyanka O Yadav    Scheduled Meds:    allopurinol   100 mg Oral Daily   Chlorhexidine  Gluconate Cloth  6 each Topical QHS   enoxaparin  (LOVENOX ) injection  40 mg Subcutaneous Daily   folic acid   1 mg Oral Daily   gabapentin   300 mg Oral BID   lactulose   10 g Oral TID   levETIRAcetam   500 mg Oral BID   midodrine   5 mg Oral TID WC   multivitamin with minerals  1 tablet Oral Daily   nicotine   21 mg Transdermal Daily   pantoprazole   40 mg Oral Daily   PHENObarbital   65 mg Intravenous Q8H   Followed by   NOREEN ON 03/10/2024] PHENObarbital   32.5 mg Intravenous Q8H   QUEtiapine   200 mg Oral QHS   sertraline   25 mg Oral Daily   thiamine   100 mg Oral Daily    Continuous Infusions:     LOS: 2 days     Trenda Mar, MD,  FACP, [facility], [facility], [facility]   [facility] [place]      To contact the attending provider between 7A-7P or the covering provider during after hours 7P-7A, please log into the web site [email or web address] and access using universal [place] password for that web site. If you do not have the password, please call the hospital operator.  03/09/2024, 3:45 PM

## 2024-03-09 NOTE — Plan of Care (Signed)

## 2024-03-09 NOTE — TOC Initial Note (Addendum)
 Transition of Care [facility]) - Initial/Assessment Note    Patient Details  Name: [name] MRN: [id number] Date of Birth: [birth year]  Transition of Care [facility]) CM/SW Contact:    Sheri ONEIDA Sharps, LCSW Phone Number: 03/09/2024, 1:40 PM  Clinical Narrative:                 Pt from home alone. Pt continues medical workup. SDOH resources added to AVS. TOC following for dc needs.    Barriers to Discharge: Continued Medical Work up   Patient Goals and CMS Choice Patient states their goals for this hospitalization and ongoing recovery are:: return home   Choice offered to / list presented to : NA      Expected Discharge Plan and Services In-house Referral: NA Discharge Planning Services: NA   Living arrangements for the past 2 months: Single Family Home                 DME Arranged: N/A DME Agency: NA       HH Arranged: NA HH Agency: NA        Prior Living Arrangements/Services Living arrangements for the past 2 months: Single Family Home Lives with:: Self Patient language and need for interpreter reviewed:: Yes Do you feel safe going back to the place where you live?: Yes      Need for Family Participation in Patient Care: Yes (Comment) Care giver support system in place?: Yes (comment)   Criminal Activity/Legal Involvement Pertinent to Current Situation/Hospitalization: No - Comment as needed  Activities of Daily Living   ADL Screening (condition at time of admission) Independently performs ADLs?: Yes (appropriate for developmental age) Is the patient deaf or have difficulty hearing?: No Does the patient have difficulty seeing, even when wearing glasses/contacts?: No Does the patient have difficulty concentrating, remembering, or making decisions?: No  Permission Sought/Granted                  Emotional Assessment Appearance:: Appears stated age     Orientation: : Oriented to Situation, Oriented to  Time, Oriented to Place, Oriented to Self Alcohol  /  Substance Use: Not Applicable Psych Involvement: No (comment)  Admission diagnosis:  Hypokalemia [E87.6] Hypomagnesemia [E83.42] Alcohol  withdrawal seizure (HCC) [F10.939, R56.9] Alcohol  withdrawal seizure without complication (HCC) [F10.930, R56.9] Alcohol  withdrawal syndrome with complication (HCC) [F10.939] Patient Active Problem List   Diagnosis Date Noted   Alcohol  withdrawal seizure (HCC) 03/06/2024   Seizure (HCC) 01/08/2024   Hypervolemic hyponatremia 03/05/2023   Hypocalcemia 03/05/2023   Hepatic cirrhosis (HCC) 03/05/2023   History of seizure 03/05/2023   Leukocytosis 03/05/2023   Chronic anemia 03/05/2023   Schizoaffective disorder (HCC) 03/05/2023   Transaminitis 03/05/2023   Elevated bilirubin 03/05/2023   Hypochloremia 03/05/2023   Alcohol  withdrawal (HCC) 02/02/2022   Hyperammonemia (HCC) 02/02/2022   Alcohol  withdrawal syndrome without complication (HCC) 01/10/2022   Acute gout 01/10/2022   Elevated liver function tests    Nausea and vomiting    Trigger finger, right middle finger 05/20/2021   Alcoholic cirrhosis of liver with ascites (HCC) 03/08/2021   Polysubstance abuse (HCC) 03/08/2021   Secondary esophageal varices without bleeding (HCC) 03/08/2021   Macrocytic anemia    Hepatic steatosis    Hypomagnesemia 05/03/2020   Hyperbilirubinemia 05/03/2020   Alcoholism (HCC) 05/03/2020   Alcoholic hepatitis without ascites 03/06/2019   Essential hypertension 01/10/2019   Alcohol  intoxication (HCC) 01/10/2019   Schizoaffective disorder, bipolar type (HCC) 06/02/2018   Alcohol -induced mood disorder (HCC) 02/22/2016   Gout  10/22/2015   GERD (gastroesophageal reflux disease) 10/22/2015   Schizoaffective disorder, depressive type (HCC)    History of alcohol  abuse    Psychosis (HCC) 05/13/2015   Tobacco dependence due to cigarettes 11/20/2014   Abnormal LFTs    Alcohol  abuse 02/09/2014   PTSD (post-traumatic stress disorder) 11/03/2011   Hypokalemia  07/02/2011   PCP:  Tanda Bleacher, MD Pharmacy:   [id number] [facility] - [place], [place] - [address] [place] [address] Phone: [phone] Fax: [phone]  [facility] [address] [place] [address] Phone: [phone] Fax: [phone]  [id number] [place], [place] - [address] [facility] [address] [place] [address] Phone: [phone] Fax: [phone]     Social Drivers of Health (SDOH) Social History: SDOH Screenings   Food Insecurity: Food Insecurity Present (03/06/2024)  Housing: Low Risk  (03/06/2024)  Recent Concern: Housing - High Risk (01/08/2024)  Transportation Needs: Unmet Transportation Needs (03/06/2024)  Utilities: Not At Risk (03/06/2024)  Recent Concern: Utilities - At Risk (01/08/2024)  Alcohol  Screen: High Risk (12/01/2023)  Depression (PHQ2-9): Medium Risk (01/28/2024)  Financial Resource Strain: High Risk (12/01/2023)  Physical Activity: Insufficiently Active (12/01/2023)  Social Connections: Moderately Integrated (12/01/2023)  Stress: Stress Concern Present (12/01/2023)  Tobacco Use: High Risk (03/08/2024)  Health Literacy: Adequate Health Literacy (12/01/2023)   SDOH Interventions:     Readmission Risk Interventions    03/09/2024    1:39 PM 09/19/2021   12:21 PM  Readmission Risk Prevention Plan  Transportation Screening Complete   HRI or Home Care Consult  Complete  Social Work Consult for Recovery Care Planning/Counseling  Complete  Palliative Care Screening  Not Applicable  Medication Review Oceanographer) Complete   PCP or Specialist appointment within 3-5 days of discharge Complete   HRI or Home Care Consult Complete   SW Recovery Care/Counseling Consult Complete   Palliative Care Screening Not Applicable   Skilled Nursing Facility Not Applicable

## 2024-03-10 DIAGNOSIS — E876 Hypokalemia: Secondary | ICD-10-CM | POA: Diagnosis not present

## 2024-03-10 DIAGNOSIS — F10939 Alcohol use, unspecified with withdrawal, unspecified: Secondary | ICD-10-CM | POA: Diagnosis not present

## 2024-03-10 DIAGNOSIS — F10930 Alcohol use, unspecified with withdrawal, uncomplicated: Secondary | ICD-10-CM | POA: Diagnosis not present

## 2024-03-10 LAB — COMPREHENSIVE METABOLIC PANEL WITH GFR
ALT: 35 U/L (ref 0–44)
AST: 60 U/L — ABNORMAL HIGH (ref 15–41)
Albumin: 3.3 g/dL — ABNORMAL LOW (ref 3.5–5.0)
Alkaline Phosphatase: 93 U/L (ref 38–126)
Anion gap: 6 (ref 5–15)
BUN: 14 mg/dL (ref 6–20)
CO2: 24 mmol/L (ref 22–32)
Calcium: 9.1 mg/dL (ref 8.9–10.3)
Chloride: 101 mmol/L (ref 98–111)
Creatinine, Ser: 0.79 mg/dL (ref 0.61–1.24)
GFR, Estimated: >60 mL/min (ref >60)
Glucose, Bld: 101 mg/dL — ABNORMAL HIGH (ref 70–99)
Potassium: 4.1 mmol/L (ref 3.5–5.1)
Sodium: 131 mmol/L — ABNORMAL LOW (ref 135–145)
Total Bilirubin: 1.8 mg/dL — ABNORMAL HIGH (ref 0.0–1.2)
Total Protein: 6.7 g/dL (ref 6.5–8.1)

## 2024-03-10 LAB — CBC
HCT: 40.7 % (ref 39.0–52.0)
Hemoglobin: 13.1 g/dL (ref 13.0–17.0)
MCH: 31.9 pg (ref 26.0–34.0)
MCHC: 32.2 g/dL (ref 30.0–36.0)
MCV: 99 fL (ref 80.0–100.0)
Platelets: 84 K/uL — ABNORMAL LOW (ref 150–400)
RBC: 4.11 MIL/uL — ABNORMAL LOW (ref 4.22–5.81)
RDW: 16 % — ABNORMAL HIGH (ref 11.5–15.5)
WBC: 5 K/uL (ref 4.0–10.5)
nRBC: 0 % (ref 0.0–0.2)

## 2024-03-10 LAB — MAGNESIUM: Magnesium: 1.5 mg/dL — ABNORMAL LOW (ref 1.7–2.4)

## 2024-03-10 LAB — VITAMIN B1: Vitamin B1 (Thiamine): 126.2 nmol/L (ref 66.5–200.0)

## 2024-03-10 MED ORDER — VITAMIN B-1 100 MG PO TABS: 100.0000 mg | ORAL_TABLET | Freq: Every day | ORAL | 0 refills | Status: DC

## 2024-03-10 MED ORDER — ADULT MULTIVITAMIN W/MINERALS CH: 1.0000 | ORAL_TABLET | Freq: Every day | ORAL | Status: DC

## 2024-03-10 MED ORDER — FOLIC ACID 1 MG PO TABS: 1.0000 mg | ORAL_TABLET | Freq: Every day | ORAL | 0 refills | Status: DC

## 2024-03-10 MED ORDER — MAGNESIUM SULFATE 4 GM/100ML IV SOLN
4.0000 g | Freq: Once | INTRAVENOUS | Status: AC
  Administered 2024-03-10: 4 g via INTRAVENOUS
  Filled 2024-03-10: qty 100

## 2024-03-10 MED ORDER — LACTULOSE 20 GM/30ML PO SOLN: 15.0000 mL | Freq: Three times a day (TID) | ORAL | 1 refills | Status: DC

## 2024-03-10 MED ORDER — LEVETIRACETAM 500 MG PO TABS: 500.0000 mg | ORAL_TABLET | Freq: Two times a day (BID) | ORAL | 1 refills | Status: DC

## 2024-03-10 NOTE — TOC Transition Note (Signed)
 Transition of Care [facility]) - Discharge Note   Patient Details  Name: [name] MRN: [id number] Date of Birth: January 25, 1971  [facility]) CM/SW Contact:  Sheri ONEIDA Sharps, LCSW Phone Number: 03/10/2024, 2:58 PM   Clinical Narrative:    Pt medically ready to dc. Resources added to AVS. No TOC needs.   Final next level of care: Home/Self Care Barriers to Discharge: Barriers Resolved   Patient Goals and CMS Choice Patient states their goals for this hospitalization and ongoing recovery are:: return home   Choice offered to / list presented to : NA      Discharge Placement                       Discharge Plan and Services Additional resources added to the After Visit Summary for   In-house Referral: NA Discharge Planning Services: NA            DME Arranged: N/A DME Agency: NA       HH Arranged: NA HH Agency: NA        Social Drivers of Health (SDOH) Interventions SDOH Screenings   Food Insecurity: Food Insecurity Present (03/06/2024)  Housing: Low Risk  (03/06/2024)  Recent Concern: Housing - High Risk (01/08/2024)  Transportation Needs: Unmet Transportation Needs (03/06/2024)  Utilities: Not At Risk (03/06/2024)  Recent Concern: Utilities - At Risk (01/08/2024)  Alcohol  Screen: High Risk (12/01/2023)  Depression (PHQ2-9): Medium Risk (01/28/2024)  Financial Resource Strain: High Risk (12/01/2023)  Physical Activity: Insufficiently Active (12/01/2023)  Social Connections: Moderately Integrated (12/01/2023)  Stress: Stress Concern Present (12/01/2023)  Tobacco Use: High Risk (03/08/2024)  Health Literacy: Adequate Health Literacy (12/01/2023)     Readmission Risk Interventions    03/09/2024    1:39 PM 09/19/2021   12:21 PM  Readmission Risk Prevention Plan  Transportation Screening Complete   HRI or Home Care Consult  Complete  Social Work Consult for Recovery Care Planning/Counseling  Complete  Palliative Care Screening  Not Applicable   Medication Review Oceanographer) Complete   PCP or Specialist appointment within 3-5 days of discharge Complete   HRI or Home Care Consult Complete   SW Recovery Care/Counseling Consult Complete   Palliative Care Screening Not Applicable   Skilled Nursing Facility Not Applicable

## 2024-03-10 NOTE — Discharge Instructions (Signed)

## 2024-03-10 NOTE — Discharge Summary (Signed)
 Physician Discharge Summary  [name] FMW:[id number] DOB: Dec 18, 1970  PCP: Tanda Bleacher, MD  Admitted from: Home Discharged to: Home   Admit date: 03/06/2024 Discharge date: 03/10/2024  Recommendations for Outpatient Follow-up:    Follow-up Information     Tanda Bleacher, MD. Schedule an appointment as soon as possible for a visit in 1 week(s).   Specialty: Family Medicine Why: To be seen with repeat labs (CBC, CMP, magnesium ). Contact information: [address] [place] [address] [phone]                  Home Health: None    Equipment/Devices: None    Discharge Condition: Improved and stable.   Code Status: Full Code Diet recommendation:  Discharge Diet Orders (From admission, onward)     Start     Ordered   03/10/24 0000  Diet - low sodium heart healthy        03/10/24 1455             Discharge Diagnoses:  Principal Problem:   Seizure (HCC) Active Problems:   Hypokalemia   PTSD (post-traumatic stress disorder)   Hypomagnesemia   Hepatic cirrhosis (HCC)   Alcohol  withdrawal seizure (HCC)   GERD (gastroesophageal reflux disease)   Gout   Schizoaffective disorder [facility])   Brief Hospital Course:  53 year old gentleman history of PTSD, schizoaffective disorder, alcohol -related liver cirrhosis, history of alcohol  withdrawal seizures presenting to the hospital with witnessed seizure with concerns for alcohol  withdrawal seizures.  Patient also noted to be on Keppra  prior to admission.  Patient seen in consultation by neurology.      Assessment & Plan:    Suspected alcohol  withdrawal seizures Witnessed TC seizure at [employer] Per report, patient's last alcoholic drink was somewhere between 2 to 6 days PTA. He reported compliance with Keppra .  Continue current dose of Keppra  Also  treated with phenobarbital  taper and as needed Ativan  per CIWA protocol Seizure precautions. EEG 7/21: Within normal limits. As per  neurology consultation note from 7/21, continue Keppra  500 Mg twice daily indefinitely, outpatient neurology follow-up. Patient has been counseled on 7/23 and on day of discharge that he must not drive until he is seizure-free for 6 months and other seizure precautions.  He verbalized understanding.   Alcohol  use disorder/alcohol  withdrawal Patient volunteers to drinking up to 5 bottles of malt liquor (states that it is 14% alcohol  in a bottle) PTA Treated with phenobarbital  taper, Ativan  per CIWA protocol, thiamine  and vitamin supplements Alcohol  withdrawal has resolved.  His CIWA scores have been 0 for more than 24 hours.  Today is his last day of phenobarbital  taper.  Will not be discharging on phenobarbital .   Severe hypokalemia/hypomagnesemia Hypomagnesemia was replaced IV on day of discharge.  Potassium normal.   Thrombocytopenia Suspected due to bone marrow toxicity from alcohol  use disorder/alcoholic cirrhosis.  Improving.  Outpatient follow-up.   Chronic pain/neuropathy Continue gabapentin  that he was on PTA.   GERD Continue PPI   Gout No acute flare.  Continue allopurinol    Liver cirrhosis Will not taking midodrine  PTA and blood pressures here have been okay, discontinued midodrine .  Patient was taking reduced dose of lactulose , continued on prior recommended dose of lactulose  which she was getting in the hospital.  Outpatient follow-up with PCP or GI Currently off of Aldactone  and Toprol -XL, blood pressures okay and no clinical volume overload.  May defer to outpatient to resume   Schizoaffective disorder Continue Zoloft  and Seroquel .  Mild hyponatremia May  be related to liver cirrhosis but not clinically volume overloaded.  Does not drink much beers and less likely to be beer potomania.  Stable.  Outpatient follow-up.   Body mass index is 29.16 kg/m.      Consultants:   Neurology   Procedures:       Discharge Instructions  Discharge Instructions      Ambulatory referral to Neurology   Complete by: As directed    An appointment is requested in approximately: 4 weeks   Call MD for:   Complete by: As directed    Recurrent seizures or seizure-like activity.   Call MD for:  difficulty breathing, headache or visual disturbances   Complete by: As directed    Call MD for:  extreme fatigue   Complete by: As directed    Call MD for:  persistant dizziness or light-headedness   Complete by: As directed    Call MD for:  persistant nausea and vomiting   Complete by: As directed    Call MD for:  severe uncontrolled pain   Complete by: As directed    Call MD for:  temperature >100.4   Complete by: As directed    Diet - low sodium heart healthy   Complete by: As directed    Driving Restrictions   Complete by: As directed    Per [place]  DMV statutes, patients with seizures are not allowed to drive until they have been seizure-free for six months.  Use caution when using heavy equipment or power tools. Avoid working on ladders or at heights. Take showers instead of baths. Ensure the water  temperature is not too high on the home water  heater. Do not go swimming alone. Do not lock yourself in a room alone (i.e. bathroom). When caring for infants or small children, sit down when holding, feeding, or changing them to minimize risk of injury to the child in the event you have a seizure. To reduce risk of seizures, maintain good sleep hygiene avoid alcohol  and illicit drug use, take all anti-seizure medications as prescribed.   If patient has another seizure, call 911 and bring them back to the ED if: A.  The seizure lasts longer than 5 minutes.      B.  The patient doesn't wake shortly after the seizure or has new problems such as difficulty seeing, speaking or moving following the seizure C.  The patient was injured during the seizure D.  The patient has a temperature over 102 F (39C) E.  The patient vomited during the seizure and now is having  trouble breathing   Increase activity slowly   Complete by: As directed         Medication List     STOP taking these medications    chlordiazePOXIDE  10 MG capsule Commonly known as: LIBRIUM    furosemide  20 MG tablet Commonly known as: LASIX    midodrine  5 MG tablet Commonly known as: PROAMATINE    nicotine  21 mg/24hr patch Commonly known as: NICODERM CQ  - dosed in mg/24 hours       TAKE these medications    allopurinol  100 MG tablet Commonly known as: ZYLOPRIM  Take 1 tablet (100 mg total) by mouth daily.   folic acid  1 MG tablet Commonly known as: FOLVITE  Take 1 tablet (1 mg total) by mouth daily. Start taking on: March 11, 2024   gabapentin  300 MG capsule Commonly known as: NEURONTIN  Take 1 capsule (300 mg total) by mouth 2 (two) times daily.   Lactulose  20  GM/30ML Soln Take 15 mLs (10 g total) by mouth 3 (three) times daily. What changed: how much to take   levETIRAcetam  500 MG tablet Commonly known as: KEPPRA  Take 1 tablet (500 mg total) by mouth 2 (two) times daily.   multivitamin with minerals Tabs tablet Take 1 tablet by mouth daily. Start taking on: March 11, 2024   pantoprazole  40 MG tablet Commonly known as: Protonix  Take 1 tablet (40 mg total) by mouth daily.   QUEtiapine  200 MG tablet Commonly known as: SEROQUEL  Take 1 tablet (200 mg total) by mouth at bedtime.   sertraline  25 MG tablet Commonly known as: ZOLOFT  Take 1 tablet (25 mg total) by mouth daily.   thiamine  100 MG tablet Commonly known as: Vitamin B-1 Take 1 tablet (100 mg total) by mouth daily. Start taking on: March 11, 2024       Allergies  Allergen Reactions   Bupropion Other (See Comments)    Caused seizures, Feel as though I will have a seizure when taking this med      Procedures/Studies: DG CHEST PORT 1 VIEW Result Date: 03/08/2024 CLINICAL DATA:  Shortness of breath EXAM: PORTABLE CHEST 1 VIEW COMPARISON:  03/06/2024 FINDINGS: Mild central airway thickening.  The lungs appear otherwise clear. Cardiac and mediastinal margins appear normal. No blunting of the costophrenic angles. IMPRESSION: 1. Mild central airway thickening, which may reflect bronchitis or reactive airways disease. Electronically Signed   By: Ryan Salvage M.D.   On: 03/08/2024 09:20   EEG adult Result Date: 03/07/2024 Shelton Arlin KIDD, MD     03/08/2024  8:31 AM Patient Name: [name] MRN: [id number] Epilepsy Attending: Arlin KIDD Shelton Referring Physician/Provider: Sebastian Toribio GAILS, MD Date: 03/07/2024 Duration: 29.21 mins Patient history: 53 y.o. male with medical history significant for PTSD, schizoaffective disorder, alcohol  related liver cirrhosis and history of withdrawal seizures being admitted to the hospital with witnessed alcohol  withdrawal seizure. EEG to evaluate for seizure Level of alertness: Awake AEDs during EEG study: GBP, Phenobarb, LEV, Ativan  Technical aspects: This EEG study was done with scalp electrodes positioned according to the 10-20 International system of electrode placement. Electrical activity was reviewed with band pass filter of 1-70Hz , sensitivity of 7 uV/mm, display speed of 25mm/sec with a 60Hz  notched filter applied as appropriate. EEG data were recorded continuously and digitally stored.  Video monitoring was available and reviewed as appropriate. Description: The posterior dominant rhythm consists of 8-9 Hz activity of moderate voltage (25-35 uV) seen predominantly in posterior head regions, symmetric and reactive to eye opening and eye closing. There is an excessive amount of 15 to 18 Hz beta activity distributed symmetrically and diffusely. Physiologic photic driving was seen during photic stimulation.  Hyperventilation was not performed.   ABNORMALITY - Excessive beta, generalized IMPRESSION: This study is within normal limits. The excessive beta activity seen in the background is most likely due to the effect of benzodiazepine and is a benign EEG  pattern. No seizures or epileptiform discharges were seen throughout the recording. A normal interictal EEG does not exclude the diagnosis of epilepsy. Arlin KIDD Shelton   DG Chest Portable 1 View Result Date: 03/06/2024 CLINICAL DATA:  Chest pain.  Witnessed seizure. EXAM: PORTABLE CHEST 1 VIEW COMPARISON:  Chest radiograph 01/08/2024 FINDINGS: The cardiomediastinal contours are normal. The lungs are clear. Pulmonary vasculature is normal. No consolidation, pleural effusion, or pneumothorax. Again seen remote right rib fracture. No acute osseous abnormalities are seen. IMPRESSION: No acute chest findings. Electronically Signed  By: Andrea Gasman M.D.   On: 03/06/2024 16:43   CT Head Wo Contrast Result Date: 03/06/2024 CLINICAL DATA:  Provided history: Head trauma, coagulopathy (Age 8-64y) Witnessed seizure at fluid line.  History of seizures. EXAM: CT HEAD WITHOUT CONTRAST TECHNIQUE: Contiguous axial images were obtained from the base of the skull through the vertex without intravenous contrast. RADIATION DOSE REDUCTION: This exam was performed according to the departmental dose-optimization program which includes automated exposure control, adjustment of the mA and/or kV according to patient size and/or use of iterative reconstruction technique. COMPARISON:  Head CT 01/08/2024 FINDINGS: Brain: No intracranial hemorrhage, mass effect, or midline shift. Stable atrophy. No hydrocephalus. The basilar cisterns are patent. No evidence of territorial infarct or acute ischemia. No extra-axial or intracranial fluid collection. Vascular: No hyperdense vessel or unexpected calcification. Skull: No fracture or focal lesion. Sinuses/Orbits: Paranasal sinuses and mastoid air cells are clear. The visualized orbits are unremarkable. Other: No confluent scalp hematoma. IMPRESSION: 1. No acute intracranial abnormality. No skull fracture. 2. Stable atrophy. Electronically Signed   By: Andrea Gasman M.D.   On: 03/06/2024  16:42      Subjective: Denies complaints.  Anxious to discharge home.  Per nursing, no acute issues noted.  Discharge Exam:  Vitals:   03/09/24 1200 03/09/24 2058 03/10/24 0424 03/10/24 1200  BP: 120/76 123/84 113/81 118/79  Pulse: 73 81 80 82  Resp: 16 18 18 18   Temp: 98.4 F (36.9 C) 98.1 F (36.7 C) 97.6 F (36.4 C) 98.4 F (36.9 C)  TempSrc: Oral  Oral   SpO2:  96% 98%   Weight:      Height:        General exam: Young male, moderately built and nourished lying comfortably propped up in bed without distress. Respiratory system: Clear to auscultation. Respiratory effort normal. Cardiovascular system: S1 & S2 heard, RRR. No JVD, murmurs, rubs, gallops or clicks. No pedal edema.  Telemetry personally reviewed: Sinus rhythm. Gastrointestinal system: Abdomen is nondistended, soft and nontender. No organomegaly or masses felt. Normal bowel sounds heard. Central nervous system: Alert and oriented. No focal neurological deficits. Extremities: Symmetric 5 x 5 power. Skin: No rashes, lesions or ulcers Psychiatry: Judgement and insight appear normal. Mood & affect appropriate.    The results of significant diagnostics from this hospitalization (including imaging, microbiology, ancillary and laboratory) are listed below for reference.     Microbiology: Recent Results (from the past 240 hours)  MRSA Next Gen by PCR, Nasal     Status: None   Collection Time: 03/06/24  8:06 PM   Specimen: Nasal Mucosa; Nasal Swab  Result Value Ref Range Status   MRSA by PCR Next Gen NOT DETECTED NOT DETECTED Final    Comment: (NOTE) The GeneXpert MRSA Assay (FDA approved for NASAL specimens only), is one component of a comprehensive MRSA colonization surveillance program. It is not intended to diagnose MRSA infection nor to guide or monitor treatment for MRSA infections. Test performance is not FDA approved in patients less than 45 years old. Performed at [facility], [address], [place], [place] [address]   Urine Culture (for pregnant, neutropenic or urologic patients or patients with an indwelling urinary catheter)     Status: None   Collection Time: 03/07/24  9:19 AM   Specimen: Urine, Clean Catch  Result Value Ref Range Status   Specimen Description   Final    URINE, CLEAN CATCH Performed at [facility], [address]  [place], [place] [address]    Special Requests   Final    NONE Performed at [facility], [address] [place], [place] [address]    Culture   Final    NO GROWTH Performed at [facility], [address] [place], [place] [address]    Report Status 03/08/2024 FINAL  Final     Labs: CBC: Recent Labs  Lab 03/06/24 1515 03/07/24 0308 03/08/24 0327 03/09/24 0557 03/10/24 0543  WBC 6.7 5.0 4.9 4.8 5.0  NEUTROABS 5.0  --  2.6  --   --   HGB 15.0 13.8 13.1 12.9* 13.1  HCT 43.2 41.5 39.5 40.9 40.7  MCV 94.3 96.3 96.8 98.8 99.0  PLT 75* 65* 55* 68* 84*    Basic Metabolic Panel: Recent Labs  Lab 03/06/24 1546 03/07/24 0308 03/07/24 1345 03/08/24 0327 03/09/24 0557 03/10/24 0543  NA  --  135 135 134* 133* 131*  K  --  2.9* 3.3* 3.2* 4.7 4.1  CL  --  94* 94* 98 101 101  CO2  --  28 30 26 23 24   GLUCOSE  --  93 91 95 91 101*  BUN  --  9 11 15 15 14   CREATININE  --  0.69 0.83 0.77 0.91 0.79  CALCIUM   --  8.5* 9.1 8.8* 9.0 9.1  MG 1.1*  --  2.1 1.8 1.9 1.5*  PHOS  --   --   --   --  3.7  --     Liver Function Tests: Recent Labs  Lab 03/06/24 1515 03/07/24 0308 03/08/24 0327 03/09/24 0557 03/10/24 0543  AST 61* 48* 62* 76* 60*  ALT 20 19 26  33 35  ALKPHOS 108 91 88 92 93  BILITOT 4.3* 3.8* 2.7* 1.8* 1.8*  PROT 7.1 6.3* 6.3* 6.7 6.7  ALBUMIN  4.0 3.4* 3.2* 3.4*  3.5 3.3*    CBG: Recent Labs  Lab 03/06/24 1508  GLUCAP 142*     Urinalysis    Component Value Date/Time   COLORURINE AMBER (A) 03/07/2024 0919   APPEARANCEUR CLEAR 03/07/2024 0919    LABSPEC 1.010 03/07/2024 0919   PHURINE 8.0 03/07/2024 0919   GLUCOSEU NEGATIVE 03/07/2024 0919   HGBUR NEGATIVE 03/07/2024 0919   BILIRUBINUR NEGATIVE 03/07/2024 0919   KETONESUR NEGATIVE 03/07/2024 0919   PROTEINUR NEGATIVE 03/07/2024 0919   UROBILINOGEN 1.0 12/24/2014 0643   NITRITE NEGATIVE 03/07/2024 0919   LEUKOCYTESUR TRACE (A) 03/07/2024 0919      Time coordinating discharge: 35 minutes  SIGNED:  Trenda Mar, MD,  FACP, [facility], [facility], [facility]   [facility]  Hospitalist & Physician Advisor [place]     To contact the attending provider between 7A-7P or the covering provider during after hours 7P-7A, please log into the web site [email or web address] and access using universal [place] password for that web site. If you do not have the password, please call the hospital operator.

## 2024-03-11 ENCOUNTER — Telehealth: Payer: Self-pay

## 2024-03-11 NOTE — Transitions of Care (Post Inpatient/ED Visit) (Signed)
   03/11/2024  Name: Espiridion Supinski MRN: [id number] DOB: 1970-12-15  Today's TOC FU Call Status: Today's TOC FU Call Status:: Unsuccessful Call (1st Attempt) Unsuccessful Call (1st Attempt) Date: 03/11/24  Attempted to reach the patient regarding the most recent Inpatient/ED visit.  Follow Up Plan: Additional outreach attempts will be made to reach the patient to complete the Transitions of Care (Post Inpatient/ED visit) call.   Signature Julian Lemmings, LPN [facility] Nurse Health Advisor Direct Dial [phone]

## 2024-03-14 NOTE — Transitions of Care (Post Inpatient/ED Visit) (Unsigned)
   03/14/2024  Name: [name] MRN: [id number] DOB: 02/07/1971  Today's TOC FU Call Status: Today's TOC FU Call Status:: Unsuccessful Call (2nd Attempt) Unsuccessful Call (1st Attempt) Date: 03/11/24 Unsuccessful Call (2nd Attempt) Date: 03/14/24  Attempted to reach the patient regarding the most recent Inpatient/ED visit.  Follow Up Plan: Additional outreach attempts will be made to reach the patient to complete the Transitions of Care (Post Inpatient/ED visit) call.   Signature Julian Lemmings, LPN [facility] Nurse Health Advisor Direct Dial [phone]

## 2024-03-15 NOTE — Transitions of Care (Post Inpatient/ED Visit) (Signed)
   03/15/2024  Name: [name] MRN: [id number] DOB: 03-19-1971  Today's TOC FU Call Status: Today's TOC FU Call Status:: Unsuccessful Call (3rd Attempt) Unsuccessful Call (1st Attempt) Date: 03/11/24 Unsuccessful Call (2nd Attempt) Date: 03/14/24 Unsuccessful Call (3rd Attempt) Date: 03/15/24  Attempted to reach the patient regarding the most recent Inpatient/ED visit.  Follow Up Plan: No further outreach attempts will be made at this time. We have been unable to contact the patient.  Signature Julian Lemmings, LPN [facility] Nurse Health Advisor Direct Dial [phone]

## 2024-04-11 ENCOUNTER — Encounter: Payer: Self-pay | Admitting: Family

## 2024-04-12 ENCOUNTER — Other Ambulatory Visit: Payer: Self-pay | Admitting: Family Medicine

## 2024-04-12 DIAGNOSIS — M792 Neuralgia and neuritis, unspecified: Secondary | ICD-10-CM

## 2024-04-12 DIAGNOSIS — F251 Schizoaffective disorder, depressive type: Secondary | ICD-10-CM

## 2024-04-12 DIAGNOSIS — K219 Gastro-esophageal reflux disease without esophagitis: Secondary | ICD-10-CM

## 2024-04-12 NOTE — Telephone Encounter (Unsigned)
 Copied from CRM [id number]. Topic: Clinical - Medication Refill >> Apr 12, 2024  4:40 PM Selinda RAMAN wrote: Medication: QUEtiapine  (SEROQUEL ) 200 MG tablet, sertraline  (ZOLOFT ) 25 MG tablet, allopurinol  (ZYLOPRIM ) 100 MG tablet, gabapentin  (NEURONTIN ) 300 MG capsule, pantoprazole  (PROTONIX ) 40 MG tablet  Has the patient contacted their pharmacy? Yes   This is the patient's preferred pharmacy:  [id number] - [place], [place] - [address] [place] [address]  Phone: [phone] Fax: [phone]    Is this the correct pharmacy for this prescription? Yes If no, delete pharmacy and type the correct one.   Has the prescription been filled recently? No  Is the patient out of the medication? Yes the Seroquel   Has the patient been seen for an appointment in the last year OR does the patient have an upcoming appointment? Yes  Can we respond through MyChart? Yes  Please assist patient further

## 2024-04-14 NOTE — Telephone Encounter (Signed)
 Requested medication (s) are due for refill today: -  Requested medication (s) are on the active medication list: yes but the seroquel , sertraline  have expired  Last refill:  Seroquel : 03/12/23 expired 03/11/24 Sertraline : 03/12/23 ended 03/11/24  Pantoprazole : 01/15/24 #30 Gabapentin : 01/15/24 #60  Future visit scheduled: no  Notes to clinic:  all meds that are being requested were ordered at hospital discharge by hospitalist.     Requested Prescriptions  Pending Prescriptions Disp Refills   QUEtiapine  (SEROQUEL ) 200 MG tablet 90 tablet 3    Sig: Take 1 tablet (200 mg total) by mouth at bedtime.     Not Delegated - Psychiatry:  Antipsychotics - Second Generation (Atypical) - quetiapine  Failed - 04/14/2024  1:06 PM      Failed - This refill cannot be delegated      Failed - TSH in normal range and within 360 days    TSH  Date Value Ref Range Status  09/08/2021 0.853 0.350 - 4.500 uIU/mL Final    Comment:    Performed by a 3rd Generation assay with a functional sensitivity of <=0.01 uIU/mL. Performed at [facility], [address] [place], [place] [address]   12/27/2020 1.650 0.450 - 4.500 uIU/mL Final         Failed - Lipid Panel in normal range within the last 12 months    Cholesterol  Date Value Ref Range Status  03/02/2019 159 0 - 200 mg/dL Final   LDL Cholesterol  Date Value Ref Range Status  03/02/2019 84 0 - 99 mg/dL Final    Comment:           Total Cholesterol/HDL:CHD Risk Coronary Heart Disease Risk Table                     Men   Women  1/2 Average Risk   3.4   3.3  Average Risk       5.0   4.4  2 X Average Risk   9.6   7.1  3 X Average Risk  23.4   11.0        Use the calculated Patient Ratio above and the CHD Risk Table to determine the patient's CHD Risk.        ATP III CLASSIFICATION (LDL):  <100     mg/dL   Optimal  899-870  mg/dL   Near or Above                    Optimal  130-159  mg/dL   Borderline  839-810  mg/dL    High  >809     mg/dL   Very High Performed at [facility], [address] [place], [place] [address]    HDL  Date Value Ref Range Status  03/02/2019 53 >40 mg/dL Final   Triglycerides  Date Value Ref Range Status  07/21/2020 164 (H) <150 mg/dL Final    Comment:    Performed at [facility], [address] [place], [place] [address]         Passed - Completed PHQ-2 or PHQ-9 in the last 360 days      Passed - Last BP in normal range    BP Readings from Last 1 Encounters:  03/10/24 118/79         Passed - Last Heart Rate in normal range    Pulse Readings from Last 1 Encounters:  03/10/24 82  Passed - Valid encounter within last 6 months    Recent Outpatient Visits           2 months ago Hospital discharge follow-up   [place] [facility], [place], [place]   4 months ago Hospital discharge follow-up   [facility], [place]   1 year ago Hospital discharge follow-up   [facility], Amy J, NP   1 year ago Encounter for Medicare annual wellness exam   [place] [facility]   1 year ago Seizure disorder [facility])   [place] [facility], MD              Passed - CBC within normal limits and completed in the last 12 months    WBC  Date Value Ref Range Status  03/10/2024 5.0 4.0 - 10.5 K/uL Final   RBC  Date Value Ref Range Status  03/10/2024 4.11 (L) 4.22 - 5.81 MIL/uL Final   Hemoglobin  Date Value Ref Range Status  03/10/2024 13.1 13.0 - 17.0 g/dL Final  93/87/7974 87.6 (L) 13.0 - 17.7 g/dL Final   HCT  Date Value Ref Range Status  03/10/2024 40.7 39.0 - 52.0 % Final   Hematocrit  Date Value Ref Range Status  01/28/2024 38.0 37.5 - 51.0 % Final   MCHC  Date Value Ref Range Status  03/10/2024 32.2 30.0 - 36.0 g/dL Final   [facility]  Date Value Ref Range Status  03/10/2024 31.9  26.0 - 34.0 pg Final   MCV  Date Value Ref Range Status  03/10/2024 99.0 80.0 - 100.0 fL Final  01/28/2024 104 (H) 79 - 97 fL Final   No results found for: PLTCOUNTKUC, LABPLAT, POCPLA RDW  Date Value Ref Range Status  03/10/2024 16.0 (H) 11.5 - 15.5 % Final  01/28/2024 14.0 11.6 - 15.4 % Final         Passed - CMP within normal limits and completed in the last 12 months    Albumin   Date Value Ref Range Status  03/10/2024 3.3 (L) 3.5 - 5.0 g/dL Final  87/86/7976 5.1 4.1 - 5.1 g/dL Final   Alkaline Phosphatase  Date Value Ref Range Status  03/10/2024 93 38 - 126 U/L Final   ALT  Date Value Ref Range Status  03/10/2024 35 0 - 44 U/L Final   AST  Date Value Ref Range Status  03/10/2024 60 (H) 15 - 41 U/L Final   BUN  Date Value Ref Range Status  03/10/2024 14 6 - 20 mg/dL Final  93/87/7974 14 6 - 24 mg/dL Final   Calcium   Date Value Ref Range Status  03/10/2024 9.1 8.9 - 10.3 mg/dL Final   Calcium , Ion  Date Value Ref Range Status  07/20/2020 1.02 (L) 1.15 - 1.40 mmol/L Final   CO2  Date Value Ref Range Status  03/10/2024 24 22 - 32 mmol/L Final   Bicarbonate  Date Value Ref Range Status  07/20/2020 28.9 (H) 20.0 - 28.0 mmol/L Final   TCO2  Date Value Ref Range Status  07/20/2020 30 22 - 32 mmol/L Final   Creat  Date Value Ref Range Status  09/13/2015 0.93 0.60 - 1.35 mg/dL Final   Creatinine, Ser  Date Value Ref Range Status  03/10/2024 0.79 0.61 - 1.24 mg/dL Final   Creatinine, Urine  Date Value Ref Range Status  01/09/2024 284 mg/dL Final    Comment:    Performed at [facility], [address] [place], [place] [address]   Glucose, Bld  Date Value Ref Range Status  03/10/2024 101 (H) 70 - 99 mg/dL Final    Comment:    Glucose reference range applies only to samples taken after fasting for at least 8 hours.   Glucose-Capillary  Date Value Ref Range Status  03/06/2024 142 (H) 70 - 99 mg/dL Final    Comment:    Glucose  reference range applies only to samples taken after fasting for at least 8 hours.   Potassium  Date Value Ref Range Status  03/10/2024 4.1 3.5 - 5.1 mmol/L Final   Sodium  Date Value Ref Range Status  03/10/2024 131 (L) 135 - 145 mmol/L Final  01/28/2024 140 134 - 144 mmol/L Final   Total Bilirubin  Date Value Ref Range Status  03/10/2024 1.8 (H) 0.0 - 1.2 mg/dL Final   Bilirubin Total  Date Value Ref Range Status  07/30/2022 0.3 0.0 - 1.2 mg/dL Final   Bilirubin, Direct  Date Value Ref Range Status  03/09/2024 0.8 (H) 0.0 - 0.2 mg/dL Final   Indirect Bilirubin  Date Value Ref Range Status  03/09/2024 1.0 (H) 0.3 - 0.9 mg/dL Final    Comment:    Performed at [facility], [address], [place], [place] [address]   Protein, ur  Date Value Ref Range Status  03/07/2024 NEGATIVE NEGATIVE mg/dL Final   Total Protein  Date Value Ref Range Status  03/10/2024 6.7 6.5 - 8.1 g/dL Final  87/86/7976 7.5 6.0 - 8.5 g/dL Final   GFR, Est African American  Date Value Ref Range Status  09/13/2015 >89 >=60 mL/min Final   GFR calc Af Amer  Date Value Ref Range Status  09/20/2020 88 >59 mL/min/1.73 Final    Comment:    **In accordance with recommendations from the NKF-ASN Task force,**   [facility] is in the process of updating its eGFR calculation to the   2021 CKD-EPI creatinine equation that estimates kidney function   without a race variable.    GFR  Date Value Ref Range Status  12/20/2020 93.70 >60.00 mL/min Final    Comment:    Calculated using the CKD-EPI Creatinine Equation (2021)   eGFR  Date Value Ref Range Status  01/28/2024 80 >59 mL/min/1.73 Final   GFR, Est Non African American  Date Value Ref Range Status  09/13/2015 >89 >=60 mL/min Final    Comment:      The estimated GFR is a calculation valid for adults (>=2 years old) that uses the CKD-EPI algorithm to adjust for age and sex. It is   not to be used for children, pregnant women,  hospitalized patients,    patients on dialysis, or with rapidly changing kidney function. According to the NKDEP, eGFR >89 is normal, 60-89 shows mild impairment, 30-59 shows moderate impairment, 15-29 shows severe impairment and <15 is ESRD.      GFR, Estimated  Date Value Ref Range Status  03/10/2024 >60 >60 mL/min Final    Comment:    (NOTE) Calculated using the CKD-EPI Creatinine Equation (2021)           sertraline  (ZOLOFT ) 25 MG tablet 30 tablet 11    Sig: Take 1 tablet (25 mg total) by mouth daily.     Psychiatry:  Antidepressants - SSRI - sertraline  Failed - 04/14/2024  1:06 PM  Failed - AST in normal range and within 360 days    AST  Date Value Ref Range Status  03/10/2024 60 (H) 15 - 41 U/L Final         Passed - ALT in normal range and within 360 days    ALT  Date Value Ref Range Status  03/10/2024 35 0 - 44 U/L Final         Passed - Completed PHQ-2 or PHQ-9 in the last 360 days      Passed - Valid encounter within last 6 months    Recent Outpatient Visits           2 months ago Hospital discharge follow-up   [facility], [place], NP   4 months ago Hospital discharge follow-up   [facility], MD   1 year ago Hospital discharge follow-up   [facility], [place], NP   1 year ago Encounter for Medicare annual wellness exam   [place] [facility]   1 year ago Seizure disorder [facility])   [place] [facility] at [facility], [place], MD               gabapentin  (NEURONTIN ) 300 MG capsule 60 capsule 0    Sig: Take 1 capsule (300 mg total) by mouth 2 (two) times daily.     Neurology: Anticonvulsants - gabapentin  Passed - 04/14/2024  1:06 PM      Passed - Cr in normal range and within 360 days    Creat  Date Value Ref Range Status  09/13/2015 0.93 0.60 - 1.35 mg/dL Final   Creatinine, Ser   Date Value Ref Range Status  03/10/2024 0.79 0.61 - 1.24 mg/dL Final   Creatinine, Urine  Date Value Ref Range Status  01/09/2024 284 mg/dL Final    Comment:    Performed at [facility], [address], [place], [place] [address]         Passed - Completed PHQ-2 or PHQ-9 in the last 360 days      Passed - Valid encounter within last 12 months    Recent Outpatient Visits           2 months ago Hospital discharge follow-up   [facility], [place], NP   4 months ago Hospital discharge follow-up   [facility], MD   1 year ago Hospital discharge follow-up   [facility], [place], NP   1 year ago Encounter for Medicare annual wellness exam   [place] [facility] at [facility]   1 year ago Seizure disorder [facility])   [place] [facility] at [facility], [place], [place]               pantoprazole  (PROTONIX ) 40 MG tablet 30 tablet 0    Sig: Take 1 tablet (40 mg total) by mouth daily.     Gastroenterology: Proton Pump Inhibitors Passed - 04/14/2024  1:06 PM      Passed - Valid encounter within last 12 months    Recent Outpatient Visits           2 months ago Hospital discharge follow-up   [facility], Greig PARAS, NP   4  months ago Hospital discharge follow-up   [place] [facility] at [facility], MD   1 year ago Hospital discharge follow-up   [name], [place], NP   1 year ago Encounter for Medicare annual wellness exam   [employer] [facility] at [facility]   1 year ago Seizure disorder [facility])   [place] [facility], MD

## 2024-04-22 ENCOUNTER — Other Ambulatory Visit: Payer: Self-pay | Admitting: Family Medicine

## 2024-04-22 DIAGNOSIS — M792 Neuralgia and neuritis, unspecified: Secondary | ICD-10-CM

## 2024-04-22 DIAGNOSIS — K219 Gastro-esophageal reflux disease without esophagitis: Secondary | ICD-10-CM

## 2024-04-22 DIAGNOSIS — F251 Schizoaffective disorder, depressive type: Secondary | ICD-10-CM

## 2024-04-22 NOTE — Telephone Encounter (Signed)
 Please review.  Sent to wrong office.  KP

## 2024-04-22 NOTE — Telephone Encounter (Signed)
 Requested medication (s) are due for refill today: yes  Requested medication (s) are on the active medication list: yes  Last refill:  Keppra : 03/10/24-05/09/24         seroquel : 03/12/23/-03/11/24      sertraline : 03/12/23 -03/11/24     pantoprazole : 01/15/24 #30          gabapentin : 01/15/24 #60  Future visit scheduled: no  Notes to clinic:  Keppra , Seroquel , sertraline , pantoprazole : written by hospitalist at hospital discharge   Requested Prescriptions  Pending Prescriptions Disp Refills   levETIRAcetam  (KEPPRA ) 500 MG tablet 60 tablet 1    Sig: Take 1 tablet (500 mg total) by mouth 2 (two) times daily.     Neurology:  Anticonvulsants - levetiracetam  Passed - 04/22/2024  1:59 PM      Passed - Cr in normal range and within 360 days    Creat  Date Value Ref Range Status  09/13/2015 0.93 0.60 - 1.35 mg/dL Final   Creatinine, Ser  Date Value Ref Range Status  03/10/2024 0.79 0.61 - 1.24 mg/dL Final   Creatinine, Urine  Date Value Ref Range Status  01/09/2024 284 mg/dL Final    Comment:    Performed at [facility], [address], [place], [place] [address]         Passed - Completed PHQ-2 or PHQ-9 in the last 360 days      Passed - Valid encounter within last 12 months    Recent Outpatient Visits           2 months ago Hospital discharge follow-up   [facility] at [facility], [place], [place]   4 months ago Hospital discharge follow-up   [facility] at [facility], MD   1 year ago Hospital discharge follow-up   [facility] at [facility], [place], NP   1 year ago Encounter for Medicare annual wellness exam   [place] [facility] at [facility]   1 year ago Seizure disorder [facility])   [place] [facility] at [facility], [place], MD               QUEtiapine  (SEROQUEL ) 200 MG tablet 90 tablet 3    Sig: Take 1 tablet (200 mg total) by mouth at bedtime.     Not Delegated -  Psychiatry:  Antipsychotics - Second Generation (Atypical) - quetiapine  Failed - 04/22/2024  1:59 PM      Failed - This refill cannot be delegated      Failed - TSH in normal range and within 360 days    TSH  Date Value Ref Range Status  09/08/2021 0.853 0.350 - 4.500 uIU/mL Final    Comment:    Performed by a 3rd Generation assay with a functional sensitivity of <=0.01 uIU/mL. Performed at [facility], [address] [place], [place] [address]   12/27/2020 1.650 0.450 - 4.500 uIU/mL Final         Failed - Lipid Panel in normal range within the last 12 months    Cholesterol  Date Value Ref Range Status  03/02/2019 159 0 - 200 mg/dL Final   LDL Cholesterol  Date Value Ref Range Status  03/02/2019 84 0 - 99 mg/dL Final    Comment:           Total Cholesterol/HDL:CHD Risk Coronary Heart Disease Risk Table  Men   Women  1/2 Average Risk   3.4   3.3  Average Risk       5.0   4.4  2 X Average Risk   9.6   7.1  3 X Average Risk  23.4   11.0        Use the calculated Patient Ratio above and the CHD Risk Table to determine the patient's CHD Risk.        ATP III CLASSIFICATION (LDL):  <100     mg/dL   Optimal  899-870  mg/dL   Near or Above                    Optimal  130-159  mg/dL   Borderline  839-810  mg/dL   High  >809     mg/dL   Very High Performed at [facility], [address], [place], [place] [address]    HDL  Date Value Ref Range Status  03/02/2019 53 >40 mg/dL Final   Triglycerides  Date Value Ref Range Status  07/21/2020 164 (H) <150 mg/dL Final    Comment:    Performed at [facility], [address], [place], [place] [address]         Passed - Completed PHQ-2 or PHQ-9 in the last 360 days      Passed - Last BP in normal range    BP Readings from Last 1 Encounters:  03/10/24 118/79         Passed - Last Heart Rate in normal range    Pulse Readings from Last 1 Encounters:  03/10/24 82          Passed - Valid encounter within last 6 months    Recent Outpatient Visits           2 months ago Hospital discharge follow-up   [facility], Amy J, NP   4 months ago Hospital discharge follow-up   [facility] at [facility], MD   1 year ago Hospital discharge follow-up   [facility], [place] J, NP   1 year ago Encounter for Medicare annual wellness exam   [place] [facility] at [facility]   1 year ago Seizure disorder [facility])   [place] [facility] at [facility], MD              Passed - CBC within normal limits and completed in the last 12 months    WBC  Date Value Ref Range Status  03/10/2024 5.0 4.0 - 10.5 K/uL Final   RBC  Date Value Ref Range Status  03/10/2024 4.11 (L) 4.22 - 5.81 MIL/uL Final   Hemoglobin  Date Value Ref Range Status  03/10/2024 13.1 13.0 - 17.0 g/dL Final  93/87/7974 87.6 (L) 13.0 - 17.7 g/dL Final   HCT  Date Value Ref Range Status  03/10/2024 40.7 39.0 - 52.0 % Final   Hematocrit  Date Value Ref Range Status  01/28/2024 38.0 37.5 - 51.0 % Final   MCHC  Date Value Ref Range Status  03/10/2024 32.2 30.0 - 36.0 g/dL Final   [facility]  Date Value Ref Range Status  03/10/2024 31.9 26.0 - 34.0 pg Final   MCV  Date Value Ref Range Status  03/10/2024 99.0 80.0 - 100.0 fL Final  01/28/2024 104 (H) 79 - 97 fL Final  No results found for: PLTCOUNTKUC, LABPLAT, POCPLA RDW  Date Value Ref Range Status  03/10/2024 16.0 (H) 11.5 - 15.5 % Final  01/28/2024 14.0 11.6 - 15.4 % Final         Passed - CMP within normal limits and completed in the last 12 months    Albumin   Date Value Ref Range Status  03/10/2024 3.3 (L) 3.5 - 5.0 g/dL Final  87/86/7976 5.1 4.1 - 5.1 g/dL Final   Alkaline Phosphatase  Date Value Ref Range Status  03/10/2024 93 38 - 126 U/L Final   ALT  Date Value Ref Range Status   03/10/2024 35 0 - 44 U/L Final   AST  Date Value Ref Range Status  03/10/2024 60 (H) 15 - 41 U/L Final   BUN  Date Value Ref Range Status  03/10/2024 14 6 - 20 mg/dL Final  93/87/7974 14 6 - 24 mg/dL Final   Calcium   Date Value Ref Range Status  03/10/2024 9.1 8.9 - 10.3 mg/dL Final   Calcium , Ion  Date Value Ref Range Status  07/20/2020 1.02 (L) 1.15 - 1.40 mmol/L Final   CO2  Date Value Ref Range Status  03/10/2024 24 22 - 32 mmol/L Final   Bicarbonate  Date Value Ref Range Status  07/20/2020 28.9 (H) 20.0 - 28.0 mmol/L Final   TCO2  Date Value Ref Range Status  07/20/2020 30 22 - 32 mmol/L Final   Creat  Date Value Ref Range Status  09/13/2015 0.93 0.60 - 1.35 mg/dL Final   Creatinine, Ser  Date Value Ref Range Status  03/10/2024 0.79 0.61 - 1.24 mg/dL Final   Creatinine, Urine  Date Value Ref Range Status  01/09/2024 284 mg/dL Final    Comment:    Performed at [facility], [address], [place], [place] [address]   Glucose, Bld  Date Value Ref Range Status  03/10/2024 101 (H) 70 - 99 mg/dL Final    Comment:    Glucose reference range applies only to samples taken after fasting for at least 8 hours.   Glucose-Capillary  Date Value Ref Range Status  03/06/2024 142 (H) 70 - 99 mg/dL Final    Comment:    Glucose reference range applies only to samples taken after fasting for at least 8 hours.   Potassium  Date Value Ref Range Status  03/10/2024 4.1 3.5 - 5.1 mmol/L Final   Sodium  Date Value Ref Range Status  03/10/2024 131 (L) 135 - 145 mmol/L Final  01/28/2024 140 134 - 144 mmol/L Final   Total Bilirubin  Date Value Ref Range Status  03/10/2024 1.8 (H) 0.0 - 1.2 mg/dL Final   Bilirubin Total  Date Value Ref Range Status  07/30/2022 0.3 0.0 - 1.2 mg/dL Final   Bilirubin, Direct  Date Value Ref Range Status  03/09/2024 0.8 (H) 0.0 - 0.2 mg/dL Final   Indirect Bilirubin  Date Value Ref Range Status  03/09/2024 1.0 (H) 0.3 - 0.9  mg/dL Final    Comment:    Performed at [facility], [address], [place], [place] [address]   Protein, ur  Date Value Ref Range Status  03/07/2024 NEGATIVE NEGATIVE mg/dL Final   Total Protein  Date Value Ref Range Status  03/10/2024 6.7 6.5 - 8.1 g/dL Final  87/86/7976 7.5 6.0 - 8.5 g/dL Final   GFR, Est African American  Date Value Ref Range Status  09/13/2015 >89 >=60 mL/min Final   GFR calc  Af Amer  Date Value Ref Range Status  09/20/2020 88 >59 mL/min/1.73 Final    Comment:    **In accordance with recommendations from the NKF-ASN Task force,**   [facility] is in the process of updating its eGFR calculation to the   2021 CKD-EPI creatinine equation that estimates kidney function   without a race variable.    GFR  Date Value Ref Range Status  12/20/2020 93.70 >60.00 mL/min Final    Comment:    Calculated using the CKD-EPI Creatinine Equation (2021)   eGFR  Date Value Ref Range Status  01/28/2024 80 >59 mL/min/1.73 Final   GFR, Est Non African American  Date Value Ref Range Status  09/13/2015 >89 >=60 mL/min Final    Comment:      The estimated GFR is a calculation valid for adults (>=50 years old) that uses the CKD-EPI algorithm to adjust for age and sex. It is   not to be used for children, pregnant women, hospitalized patients,    patients on dialysis, or with rapidly changing kidney function. According to the NKDEP, eGFR >89 is normal, 60-89 shows mild impairment, 30-59 shows moderate impairment, 15-29 shows severe impairment and <15 is ESRD.      GFR, Estimated  Date Value Ref Range Status  03/10/2024 >60 >60 mL/min Final    Comment:    (NOTE) Calculated using the CKD-EPI Creatinine Equation (2021)           sertraline  (ZOLOFT ) 25 MG tablet 30 tablet 11    Sig: Take 1 tablet (25 mg total) by mouth daily.     Psychiatry:  Antidepressants - SSRI - sertraline  Failed - 04/22/2024  1:59 PM      Failed - AST in normal range and  within 360 days    AST  Date Value Ref Range Status  03/10/2024 60 (H) 15 - 41 U/L Final         Passed - ALT in normal range and within 360 days    ALT  Date Value Ref Range Status  03/10/2024 35 0 - 44 U/L Final         Passed - Completed PHQ-2 or PHQ-9 in the last 360 days      Passed - Valid encounter within last 6 months    Recent Outpatient Visits           2 months ago Hospital discharge follow-up   [facility] at [facility], [place], NP   4 months ago Hospital discharge follow-up   [name], MD   1 year ago Hospital discharge follow-up   [facility] at [facility], [place], NP   1 year ago Encounter for Medicare annual wellness exam   [place] [facility] at [facility]   1 year ago Seizure disorder [facility])   [place] [facility] Care at [facility], [place], MD               pantoprazole  (PROTONIX ) 40 MG tablet 30 tablet 0    Sig: Take 1 tablet (40 mg total) by mouth daily.     Gastroenterology: Proton Pump Inhibitors Passed - 04/22/2024  1:59 PM      Passed - Valid encounter within last 12 months    Recent Outpatient Visits           2 months ago Hospital discharge follow-up   [facility] at [facility]  Lorren Greig PARAS, NP   4 months ago Hospital discharge follow-up   [facility] at [facility], MD   1 year ago Hospital discharge follow-up   [facility] at [facility], [place], NP   1 year ago Encounter for Medicare annual wellness exam   [place] [facility] at [facility]   1 year ago Seizure disorder [facility])   [place] [facility] at [facility], [place], MD               gabapentin  (NEURONTIN ) 300 MG capsule 60 capsule 0    Sig: Take 1 capsule (300 mg total) by mouth 2 (two) times daily.     Neurology: Anticonvulsants - gabapentin  Passed - 04/22/2024   1:59 PM      Passed - Cr in normal range and within 360 days    Creat  Date Value Ref Range Status  09/13/2015 0.93 0.60 - 1.35 mg/dL Final   Creatinine, Ser  Date Value Ref Range Status  03/10/2024 0.79 0.61 - 1.24 mg/dL Final   Creatinine, Urine  Date Value Ref Range Status  01/09/2024 284 mg/dL Final    Comment:    Performed at [facility], [address], [place], [place] [address]         Passed - Completed PHQ-2 or PHQ-9 in the last 360 days      Passed - Valid encounter within last 12 months    Recent Outpatient Visits           2 months ago Hospital discharge follow-up   [facility] at [facility], [place], NP   4 months ago Hospital discharge follow-up   [facility] at [facility], MD   1 year ago Hospital discharge follow-up   [facility] at [facility], [place], NP   1 year ago Encounter for Medicare annual wellness exam   [place] [facility] at [facility]   1 year ago Seizure disorder [facility])   [place] [facility] Care at [facility], MD

## 2024-04-22 NOTE — Telephone Encounter (Signed)
 Copied from CRM [id number]. Topic: Clinical - Medication Refill >> Apr 22, 2024  9:42 AM Turkey B wrote: Medication: levETIRAcetam  (KEPPRA ) 500 MG tablet/ Seroquel  200mg  Sertraline  25mg  Pantoprazole : 40 mg Gabapentin  300mg   2nd request: patient states these were prescribed at hospital and doesn't have ontact with Dr's that prescribed these previously    Has the patient contacted their pharmacy? yes (Agent: If yes, when and what did the pharmacy advise?)  This is the patient's preferred pharmacy:  [facility], [place] - [address] [place] [address] Phone: [phone] Fax: [phone]  Is this the correct pharmacy for this prescription? yes If no, delete pharmacy and type the correct one.   Has the prescription been filled recently? no  Is the patient out of the medication? yes  Has the patient been seen for an appointment in the last year OR does the patient have an upcoming appointment? yes  Can we respond through MyChart? yes  Agent: Please be advised that Rx refills may take up to 3 business days. We ask that you follow-up with your pharmacy.

## 2024-04-27 ENCOUNTER — Telehealth: Payer: Self-pay

## 2024-04-27 NOTE — Telephone Encounter (Signed)
 Copied from CRM [id number]. Topic: Clinical - Medication Question >> Apr 25, 2024  1:15 PM Cleave MATSU wrote: Reason for CRM: pt needs his medication sent in to [facility]

## 2024-04-27 NOTE — Telephone Encounter (Signed)
 Copied from CRM #[id number]. Topic: General - Other >> Apr 27, 2024  4:39 PM Jasmin G wrote: Reason for CRM: Pt requested a call back from clinic to get an update on med refills, he states that he has been trying to get his refills for about 2 weeks now.

## 2024-04-28 NOTE — Telephone Encounter (Signed)
 Tried to call pt to advise. No answer. LVM to call back

## 2024-04-29 ENCOUNTER — Ambulatory Visit: Admitting: Family Medicine

## 2024-04-29 NOTE — Telephone Encounter (Signed)
 Pt was no show for his appt today

## 2024-06-17 ENCOUNTER — Other Ambulatory Visit: Payer: Self-pay | Admitting: Family Medicine

## 2024-06-17 ENCOUNTER — Telehealth: Payer: Self-pay | Admitting: Family Medicine

## 2024-06-17 DIAGNOSIS — K219 Gastro-esophageal reflux disease without esophagitis: Secondary | ICD-10-CM

## 2024-06-17 NOTE — Telephone Encounter (Signed)
 Copied from CRM #[id number]. Topic: Clinical - Medication Refill >> Jun 17, 2024 10:32 AM Wess RAMAN wrote: Medication: pantoprazole  (PROTONIX ) 40 MG tablet  QUEtiapine  (SEROQUEL ) 200 MG tablet   Has the patient contacted their pharmacy? No (Agent: If no, request that the patient contact the pharmacy for the refill. If patient does not wish to contact the pharmacy document the reason why and proceed with request.) (Agent: If yes, when and what did the pharmacy advise?)  This is the patient's preferred pharmacy:  [id number] [place], [place] - [address] AT [facility] [address] [place] [address] Phone: [phone] Fax: [phone] Hours: Not open 24 hours   Is this the correct pharmacy for this prescription? Yes If no, delete pharmacy and type the correct one.   Has the prescription been filled recently? Yes  Is the patient out of the medication? Yes  Has the patient been seen for an appointment in the last year OR does the patient have an upcoming appointment? Yes  Can we respond through MyChart? Yes  Agent: Please be advised that Rx refills may take up to 3 business days. We ask that you follow-up with your pharmacy.

## 2024-06-17 NOTE — Telephone Encounter (Signed)
 Copied from CRM #[id number]. Topic: Clinical - Medication Refill >> Jun 17, 2024 10:10 AM Delon HERO wrote: Medication: QUEtiapine  (SEROQUEL ) 200 MG tablet [[id number]] ENDED  Lactulose  20 GM/30ML SOLN [[id number]]  allopurinol  (ZYLOPRIM ) 100 MG tablet [[id number]]   sertraline  (ZOLOFT ) 25 MG tablet [[id number]] ENDED       Has the patient contacted their pharmacy? Yes (Agent: If no, request that the patient contact the pharmacy for the refill. If patient does not wish to contact the pharmacy document the reason why and proceed with request.) (Agent: If yes, when and what did the pharmacy advise?)  This is the patient's preferred pharmacy:  [facility], [place] - [address] [place] [address] Phone: [phone] Fax: [phone]  Is this the correct pharmacy for this prescription? Yes If no, delete pharmacy and type the correct one.   Has the prescription been filled recently? Yes  Is the patient out of the medication? Yes  Has the patient been seen for an appointment in the last year OR does the patient have an upcoming appointment? Yes  Can we respond through MyChart? Yes  Agent: Please be advised that Rx refills may take up to 3 business days. We ask that you follow-up with your pharmacy.

## 2024-06-18 NOTE — Telephone Encounter (Signed)
 Requested medication (s) are due for refill today: yes  Requested medication (s) are on the active medication list: yes  Last refill:  01/15/24,03/12/23  Future visit scheduled: yes  Notes to clinic:  Unable to refill per protocol, cannot delegate. last refill by another provider.       Requested Prescriptions  Pending Prescriptions Disp Refills   pantoprazole  (PROTONIX ) 40 MG tablet 30 tablet 0    Sig: Take 1 tablet (40 mg total) by mouth daily.     Gastroenterology: Proton Pump Inhibitors Passed - 06/18/2024 10:29 AM      Passed - Valid encounter within last 12 months    Recent Outpatient Visits           4 months ago Hospital discharge follow-up   [facility] at [facility], [place], [place]   6 months ago Hospital discharge follow-up   [facility] at [facility], MD   1 year ago Hospital discharge follow-up   [facility] at [facility], [place], NP   1 year ago Encounter for Medicare annual wellness exam   [place] [facility] at [facility]   1 year ago Seizure disorder [facility])   [place] Primary Care at [facility], Rag[place], MD               QUEtiapine  (SEROQUEL ) 200 MG tablet 90 tablet 3    Sig: Take 1 tablet (200 mg total) by mouth at bedtime.     Not Delegated - Psychiatry:  Antipsychotics - Second Generation (Atypical) - quetiapine  Failed - 06/18/2024 10:29 AM      Failed - This refill cannot be delegated      Failed - TSH in normal range and within 360 days    TSH  Date Value Ref Range Status  09/08/2021 0.853 0.350 - 4.500 uIU/mL Final    Comment:    Performed by a 3rd Generation assay with a functional sensitivity of <=0.01 uIU/mL. Performed at [facility], [address], [place], [place] [address]   12/27/2020 1.650 0.450 - 4.500 uIU/mL Final         Failed - Lipid Panel in normal range within the last 12 months    Cholesterol  Date Value  Ref Range Status  03/02/2019 159 0 - 200 mg/dL Final   LDL Cholesterol  Date Value Ref Range Status  03/02/2019 84 0 - 99 mg/dL Final    Comment:           Total Cholesterol/HDL:CHD Risk Coronary Heart Disease Risk Table                     Men   Women  1/2 Average Risk   3.4   3.3  Average Risk       5.0   4.4  2 X Average Risk   9.6   7.1  3 X Average Risk  23.4   11.0        Use the calculated Patient Ratio above and the CHD Risk Table to determine the patient's CHD Risk.        ATP III CLASSIFICATION (LDL):  <100     mg/dL   Optimal  899-870  mg/dL   Near or Above                    Optimal  130-159  mg/dL   Borderline  839-810  mg/dL   High  >809     mg/dL   Very High Performed at [facility], [address] [place], [place] [address]    HDL  Date Value Ref Range Status  03/02/2019 53 >40 mg/dL Final   Triglycerides  Date Value Ref Range Status  07/21/2020 164 (H) <150 mg/dL Final    Comment:    Performed at [facility], [address] [place], [place] [address]         Passed - Completed PHQ-2 or PHQ-9 in the last 360 days      Passed - Last BP in normal range    BP Readings from Last 1 Encounters:  03/10/24 118/79         Passed - Last Heart Rate in normal range    Pulse Readings from Last 1 Encounters:  03/10/24 82         Passed - Valid encounter within last 6 months    Recent Outpatient Visits           4 months ago Hospital discharge follow-up   [facility] at [facility], Amy J, NP   6 months ago Hospital discharge follow-up   [facility] at [facility], MD   1 year ago Hospital discharge follow-up   [facility] at [facility], Amy J, NP   1 year ago Encounter for Medicare annual wellness exam   [place] [facility] at [facility]   1 year ago Seizure disorder [facility])   [place] [facility] at [facility], MD               Passed - CBC within normal limits and completed in the last 12 months    WBC  Date Value Ref Range Status  03/10/2024 5.0 4.0 - 10.5 K/uL Final   RBC  Date Value Ref Range Status  03/10/2024 4.11 (L) 4.22 - 5.81 MIL/uL Final   Hemoglobin  Date Value Ref Range Status  03/10/2024 13.1 13.0 - 17.0 g/dL Final  93/87/7974 87.6 (L) 13.0 - 17.7 g/dL Final   HCT  Date Value Ref Range Status  03/10/2024 40.7 39.0 - 52.0 % Final   Hematocrit  Date Value Ref Range Status  01/28/2024 38.0 37.5 - 51.0 % Final   MCHC  Date Value Ref Range Status  03/10/2024 32.2 30.0 - 36.0 g/dL Final   [facility]  Date Value Ref Range Status  03/10/2024 31.9 26.0 - 34.0 pg Final   MCV  Date Value Ref Range Status  03/10/2024 99.0 80.0 - 100.0 fL Final  01/28/2024 104 (H) 79 - 97 fL Final   No results found for: PLTCOUNTKUC, LABPLAT, POCPLA RDW  Date Value Ref Range Status  03/10/2024 16.0 (H) 11.5 - 15.5 % Final  01/28/2024 14.0 11.6 - 15.4 % Final         Passed - CMP within normal limits and completed in the last 12 months    Albumin   Date Value Ref Range Status  03/10/2024 3.3 (L) 3.5 - 5.0 g/dL Final  87/86/7976 5.1 4.1 - 5.1 g/dL Final   Alkaline Phosphatase  Date Value Ref Range Status  03/10/2024 93 38 - 126 U/L Final   ALT  Date Value Ref Range Status  03/10/2024 35 0 - 44 U/L Final   AST  Date Value Ref Range Status  03/10/2024 60 (H) 15 - 41 U/L Final  BUN  Date Value Ref Range Status  03/10/2024 14 6 - 20 mg/dL Final  93/87/7974 14 6 - 24 mg/dL Final   Calcium   Date Value Ref Range Status  03/10/2024 9.1 8.9 - 10.3 mg/dL Final   Calcium , Ion  Date Value Ref Range Status  07/20/2020 1.02 (L) 1.15 - 1.40 mmol/L Final   CO2  Date Value Ref Range Status  03/10/2024 24 22 - 32 mmol/L Final   Bicarbonate  Date Value Ref Range Status  07/20/2020 28.9 (H) 20.0 - 28.0 mmol/L Final   TCO2  Date Value Ref Range Status  07/20/2020 30 22 - 32 mmol/L  Final   Creat  Date Value Ref Range Status  09/13/2015 0.93 0.60 - 1.35 mg/dL Final   Creatinine, Ser  Date Value Ref Range Status  03/10/2024 0.79 0.61 - 1.24 mg/dL Final   Creatinine, Urine  Date Value Ref Range Status  01/09/2024 284 mg/dL Final    Comment:    Performed at [facility], [address], [place], [place] [address]   Glucose, Bld  Date Value Ref Range Status  03/10/2024 101 (H) 70 - 99 mg/dL Final    Comment:    Glucose reference range applies only to samples taken after fasting for at least 8 hours.   Glucose-Capillary  Date Value Ref Range Status  03/06/2024 142 (H) 70 - 99 mg/dL Final    Comment:    Glucose reference range applies only to samples taken after fasting for at least 8 hours.   Potassium  Date Value Ref Range Status  03/10/2024 4.1 3.5 - 5.1 mmol/L Final   Sodium  Date Value Ref Range Status  03/10/2024 131 (L) 135 - 145 mmol/L Final  01/28/2024 140 134 - 144 mmol/L Final   Total Bilirubin  Date Value Ref Range Status  03/10/2024 1.8 (H) 0.0 - 1.2 mg/dL Final   Bilirubin Total  Date Value Ref Range Status  07/30/2022 0.3 0.0 - 1.2 mg/dL Final   Bilirubin, Direct  Date Value Ref Range Status  03/09/2024 0.8 (H) 0.0 - 0.2 mg/dL Final   Indirect Bilirubin  Date Value Ref Range Status  03/09/2024 1.0 (H) 0.3 - 0.9 mg/dL Final    Comment:    Performed at [facility], [address], [place], [place] [address]   Protein, ur  Date Value Ref Range Status  03/07/2024 NEGATIVE NEGATIVE mg/dL Final   Total Protein  Date Value Ref Range Status  03/10/2024 6.7 6.5 - 8.1 g/dL Final  87/86/7976 7.5 6.0 - 8.5 g/dL Final   GFR, Est African American  Date Value Ref Range Status  09/13/2015 >89 >=60 mL/min Final   GFR calc Af Amer  Date Value Ref Range Status  09/20/2020 88 >59 mL/min/1.73 Final    Comment:    **In accordance with recommendations from the NKF-ASN Task force,**   [facility] is in the process  of updating its eGFR calculation to the   2021 CKD-EPI creatinine equation that estimates kidney function   without a race variable.    GFR  Date Value Ref Range Status  12/20/2020 93.70 >60.00 mL/min Final    Comment:    Calculated using the CKD-EPI Creatinine Equation (2021)   eGFR  Date Value Ref Range Status  01/28/2024 80 >59 mL/min/1.73 Final   GFR, Est Non African American  Date Value Ref Range Status  09/13/2015 >89 >=60 mL/min Final    Comment:      The  estimated GFR is a calculation valid for adults (>=39 years old) that uses the CKD-EPI algorithm to adjust for age and sex. It is   not to be used for children, pregnant women, hospitalized patients,    patients on dialysis, or with rapidly changing kidney function. According to the NKDEP, eGFR >89 is normal, 60-89 shows mild impairment, 30-59 shows moderate impairment, 15-29 shows severe impairment and <15 is ESRD.      GFR, Estimated  Date Value Ref Range Status  03/10/2024 >60 >60 mL/min Final    Comment:    (NOTE) Calculated using the CKD-EPI Creatinine Equation (2021)

## 2024-06-20 ENCOUNTER — Other Ambulatory Visit: Payer: Self-pay | Admitting: Family Medicine

## 2024-06-21 MED ORDER — QUETIAPINE FUMARATE 200 MG PO TABS: 200.0000 mg | ORAL_TABLET | Freq: Every day | ORAL | 0 refills | Status: DC

## 2024-06-21 MED ORDER — PANTOPRAZOLE SODIUM 40 MG PO TBEC: 40.0000 mg | DELAYED_RELEASE_TABLET | Freq: Every day | ORAL | 0 refills | Status: DC

## 2024-06-29 ENCOUNTER — Other Ambulatory Visit: Payer: Self-pay | Admitting: Family Medicine

## 2024-06-29 NOTE — Telephone Encounter (Signed)
 Copied from CRM [id number]. Topic: Clinical - Medication Refill >> Jun 29, 2024  8:32 AM Tobias L wrote: Medication: levETIRAcetam  (KEPPRA ) 500 MG tablet Lactulose  20 GM/30ML SOLN  Has the patient contacted their pharmacy? Yes Told to contact the office for further refills.   This is the patient's preferred pharmacy:  [facility], [place] - [address] [place] [address] Phone: [phone] Fax: [phone]  Is this the correct pharmacy for this prescription? Yes If no, delete pharmacy and type the correct one.   Has the prescription been filled recently? No  Is the patient out of the medication? Yes  Has the patient been seen for an appointment in the last year OR does the patient have an upcoming appointment? Yes  Can we respond through MyChart? No  Agent: Please be advised that Rx refills may take up to 3 business days. We ask that you follow-up with your pharmacy.

## 2024-06-30 MED ORDER — LACTULOSE 20 GM/30ML PO SOLN: 15.0000 mL | Freq: Three times a day (TID) | ORAL | 2 refills | Status: DC

## 2024-06-30 NOTE — Telephone Encounter (Signed)
 Requested Prescriptions  Pending Prescriptions Disp Refills   Lactulose  20 GM/30ML SOLN 946 mL 2    Sig: Take 15 mLs (10 g total) by mouth 3 (three) times daily.     Gastroenterology:  Laxatives - lactulose  Failed - 06/30/2024  5:38 PM      Failed - Na in normal range and within 360 days    Sodium  Date Value Ref Range Status  03/10/2024 131 (L) 135 - 145 mmol/L Final  01/28/2024 140 134 - 144 mmol/L Final         Passed - Cl in normal range and within 360 days    Chloride  Date Value Ref Range Status  03/10/2024 101 98 - 111 mmol/L Final         Passed - CO2 in normal range and within 360 days    CO2  Date Value Ref Range Status  03/10/2024 24 22 - 32 mmol/L Final   Bicarbonate  Date Value Ref Range Status  07/20/2020 28.9 (H) 20.0 - 28.0 mmol/L Final         Passed - K in normal range and within 360 days    Potassium  Date Value Ref Range Status  03/10/2024 4.1 3.5 - 5.1 mmol/L Final         Passed - Valid encounter within last 12 months    Recent Outpatient Visits           5 months ago Hospital discharge follow-up   [facility], [place], [place]   7 months ago Hospital discharge follow-up   [facility] at [facility], MD   1 year ago Hospital discharge follow-up   [facility] at [facility], [place], NP   1 year ago Encounter for Medicare annual wellness exam   [place] [facility] at [facility]   1 year ago Seizure disorder [facility])   [place] [facility] at [facility], Raguel, MD               levETIRAcetam  (KEPPRA ) 500 MG tablet 60 tablet 1    Sig: Take 1 tablet (500 mg total) by mouth 2 (two) times daily.     Neurology:  Anticonvulsants - levetiracetam  Passed - 06/30/2024  5:38 PM      Passed - Cr in normal range and within 360 days    Creat  Date Value Ref Range Status  09/13/2015 0.93 0.60 - 1.35 mg/dL Final   Creatinine, Ser  Date  Value Ref Range Status  03/10/2024 0.79 0.61 - 1.24 mg/dL Final   Creatinine, Urine  Date Value Ref Range Status  01/09/2024 284 mg/dL Final    Comment:    Performed at [facility], [address] [place], [place] [address]         Passed - Completed PHQ-2 or PHQ-9 in the last 360 days      Passed - Valid encounter within last 12 months    Recent Outpatient Visits           5 months ago Hospital discharge follow-up   [facility] at [facility], [place], [place]   7 months ago Hospital discharge follow-up   [facility], MD   1 year ago Hospital discharge follow-up   [facility] at [facility], [place], NP   1 year ago  Encounter for Medicare annual wellness exam   [place] [facility] at [facility]   1 year ago Seizure disorder [facility])    Primary Care at [facility], MD

## 2024-06-30 NOTE — Telephone Encounter (Signed)
 Requested medications are due for refill today.  yes  Requested medications are on the active medications list.  yes  Last refill. 03/10/2024 #60 1 rf  Future visit scheduled.   yes  Notes to clinic.  Rx written to expire 05/09/2024 - rx is expired. Rx signed by Dr. Judeth    Requested Prescriptions  Pending Prescriptions Disp Refills   levETIRAcetam  (KEPPRA ) 500 MG tablet 60 tablet 1    Sig: Take 1 tablet (500 mg total) by mouth 2 (two) times daily.     Neurology:  Anticonvulsants - levetiracetam  Passed - 06/30/2024  5:39 PM      Passed - Cr in normal range and within 360 days    Creat  Date Value Ref Range Status  09/13/2015 0.93 0.60 - 1.35 mg/dL Final   Creatinine, Ser  Date Value Ref Range Status  03/10/2024 0.79 0.61 - 1.24 mg/dL Final   Creatinine, Urine  Date Value Ref Range Status  01/09/2024 284 mg/dL Final    Comment:    Performed at [facility], [address] [place], [place] [address]         Passed - Completed PHQ-2 or PHQ-9 in the last 360 days      Passed - Valid encounter within last 12 months    Recent Outpatient Visits           5 months ago Hospital discharge follow-up   [facility] at [facility], [place], NP   7 months ago Hospital discharge follow-up   [facility], MD   1 year ago Hospital discharge follow-up   [facility] at [facility], [place], NP   1 year ago Encounter for Medicare annual wellness exam   [place] [facility] at [facility]   1 year ago Seizure disorder [facility])   [place] Primary Care at [facility], MD              Signed Prescriptions Disp Refills   Lactulose  20 GM/30ML SOLN 946 mL 2    Sig: Take 15 mLs (10 g total) by mouth 3 (three) times daily.     Gastroenterology:  Laxatives - lactulose  Failed - 06/30/2024  5:39 PM      Failed - Na in normal range and within 360 days    Sodium   Date Value Ref Range Status  03/10/2024 131 (L) 135 - 145 mmol/L Final  01/28/2024 140 134 - 144 mmol/L Final         Passed - Cl in normal range and within 360 days    Chloride  Date Value Ref Range Status  03/10/2024 101 98 - 111 mmol/L Final         Passed - CO2 in normal range and within 360 days    CO2  Date Value Ref Range Status  03/10/2024 24 22 - 32 mmol/L Final   Bicarbonate  Date Value Ref Range Status  07/20/2020 28.9 (H) 20.0 - 28.0 mmol/L Final         Passed - K in normal range and within 360 days    Potassium  Date Value Ref Range Status  03/10/2024 4.1 3.5 - 5.1 mmol/L Final         Passed - Valid encounter within last 12 months    Recent Outpatient Visits           5 months ago [facility] [place]  discharge follow-up   [place] [facility] at [facility], [place], [place]   7 months ago Hospital discharge follow-up   [facility] at [facility], MD   1 year ago Hospital discharge follow-up   [facility], [place], [place]   1 year ago Encounter for Medicare annual wellness exam   [place] Primary Care at [facility]   1 year ago Seizure disorder [facility])   [place] Primary Care at [facility], MD

## 2024-07-03 ENCOUNTER — Inpatient Hospital Stay (HOSPITAL_COMMUNITY)

## 2024-07-03 ENCOUNTER — Emergency Department (HOSPITAL_COMMUNITY)

## 2024-07-03 ENCOUNTER — Other Ambulatory Visit: Payer: Self-pay

## 2024-07-03 ENCOUNTER — Inpatient Hospital Stay (HOSPITAL_COMMUNITY)
Admission: EM | Admit: 2024-07-03 | Discharge: 2024-07-18 | DRG: 870 | Disposition: E | Attending: Internal Medicine | Admitting: Internal Medicine

## 2024-07-03 DIAGNOSIS — Z818 Family history of other mental and behavioral disorders: Secondary | ICD-10-CM

## 2024-07-03 DIAGNOSIS — D696 Thrombocytopenia, unspecified: Secondary | ICD-10-CM | POA: Diagnosis present

## 2024-07-03 DIAGNOSIS — R579 Shock, unspecified: Secondary | ICD-10-CM | POA: Diagnosis not present

## 2024-07-03 DIAGNOSIS — R17 Unspecified jaundice: Secondary | ICD-10-CM

## 2024-07-03 DIAGNOSIS — Z66 Do not resuscitate: Secondary | ICD-10-CM | POA: Diagnosis not present

## 2024-07-03 DIAGNOSIS — N179 Acute kidney failure, unspecified: Secondary | ICD-10-CM | POA: Diagnosis not present

## 2024-07-03 DIAGNOSIS — K219 Gastro-esophageal reflux disease without esophagitis: Secondary | ICD-10-CM | POA: Diagnosis present

## 2024-07-03 DIAGNOSIS — E871 Hypo-osmolality and hyponatremia: Secondary | ICD-10-CM | POA: Diagnosis present

## 2024-07-03 DIAGNOSIS — K2211 Ulcer of esophagus with bleeding: Secondary | ICD-10-CM | POA: Diagnosis present

## 2024-07-03 DIAGNOSIS — E8770 Fluid overload, unspecified: Secondary | ICD-10-CM | POA: Diagnosis present

## 2024-07-03 DIAGNOSIS — F4310 Post-traumatic stress disorder, unspecified: Secondary | ICD-10-CM | POA: Diagnosis present

## 2024-07-03 DIAGNOSIS — F17210 Nicotine dependence, cigarettes, uncomplicated: Secondary | ICD-10-CM | POA: Diagnosis present

## 2024-07-03 DIAGNOSIS — E8729 Other acidosis: Secondary | ICD-10-CM

## 2024-07-03 DIAGNOSIS — F259 Schizoaffective disorder, unspecified: Secondary | ICD-10-CM | POA: Diagnosis present

## 2024-07-03 DIAGNOSIS — W06XXXA Fall from bed, initial encounter: Secondary | ICD-10-CM | POA: Diagnosis present

## 2024-07-03 DIAGNOSIS — F32A Depression, unspecified: Secondary | ICD-10-CM | POA: Diagnosis present

## 2024-07-03 DIAGNOSIS — E876 Hypokalemia: Secondary | ICD-10-CM | POA: Diagnosis present

## 2024-07-03 DIAGNOSIS — K7030 Alcoholic cirrhosis of liver without ascites: Secondary | ICD-10-CM | POA: Diagnosis present

## 2024-07-03 DIAGNOSIS — Z79899 Other long term (current) drug therapy: Secondary | ICD-10-CM

## 2024-07-03 DIAGNOSIS — E872 Acidosis, unspecified: Secondary | ICD-10-CM | POA: Diagnosis present

## 2024-07-03 DIAGNOSIS — W19XXXA Unspecified fall, initial encounter: Principal | ICD-10-CM

## 2024-07-03 DIAGNOSIS — K2981 Duodenitis with bleeding: Secondary | ICD-10-CM | POA: Diagnosis present

## 2024-07-03 DIAGNOSIS — Z515 Encounter for palliative care: Secondary | ICD-10-CM

## 2024-07-03 DIAGNOSIS — N170 Acute kidney failure with tubular necrosis: Secondary | ICD-10-CM | POA: Diagnosis present

## 2024-07-03 DIAGNOSIS — F419 Anxiety disorder, unspecified: Secondary | ICD-10-CM | POA: Diagnosis present

## 2024-07-03 DIAGNOSIS — K7682 Hepatic encephalopathy: Secondary | ICD-10-CM | POA: Diagnosis present

## 2024-07-03 DIAGNOSIS — I8510 Secondary esophageal varices without bleeding: Secondary | ICD-10-CM | POA: Diagnosis present

## 2024-07-03 DIAGNOSIS — R34 Anuria and oliguria: Secondary | ICD-10-CM | POA: Diagnosis not present

## 2024-07-03 DIAGNOSIS — R739 Hyperglycemia, unspecified: Secondary | ICD-10-CM | POA: Diagnosis not present

## 2024-07-03 DIAGNOSIS — D689 Coagulation defect, unspecified: Secondary | ICD-10-CM | POA: Diagnosis present

## 2024-07-03 DIAGNOSIS — Z888 Allergy status to other drugs, medicaments and biological substances status: Secondary | ICD-10-CM

## 2024-07-03 DIAGNOSIS — D62 Acute posthemorrhagic anemia: Secondary | ICD-10-CM | POA: Diagnosis present

## 2024-07-03 DIAGNOSIS — I129 Hypertensive chronic kidney disease with stage 1 through stage 4 chronic kidney disease, or unspecified chronic kidney disease: Secondary | ICD-10-CM | POA: Diagnosis present

## 2024-07-03 DIAGNOSIS — K922 Gastrointestinal hemorrhage, unspecified: Secondary | ICD-10-CM | POA: Diagnosis not present

## 2024-07-03 DIAGNOSIS — Y92009 Unspecified place in unspecified non-institutional (private) residence as the place of occurrence of the external cause: Secondary | ICD-10-CM

## 2024-07-03 DIAGNOSIS — K269 Duodenal ulcer, unspecified as acute or chronic, without hemorrhage or perforation: Secondary | ICD-10-CM

## 2024-07-03 DIAGNOSIS — G40909 Epilepsy, unspecified, not intractable, without status epilepticus: Secondary | ICD-10-CM | POA: Diagnosis present

## 2024-07-03 DIAGNOSIS — Z811 Family history of alcohol abuse and dependence: Secondary | ICD-10-CM

## 2024-07-03 DIAGNOSIS — K7040 Alcoholic hepatic failure without coma: Principal | ICD-10-CM | POA: Diagnosis present

## 2024-07-03 DIAGNOSIS — E878 Other disorders of electrolyte and fluid balance, not elsewhere classified: Secondary | ICD-10-CM | POA: Diagnosis present

## 2024-07-03 DIAGNOSIS — J9601 Acute respiratory failure with hypoxia: Secondary | ICD-10-CM | POA: Diagnosis present

## 2024-07-03 DIAGNOSIS — N189 Chronic kidney disease, unspecified: Secondary | ICD-10-CM | POA: Diagnosis present

## 2024-07-03 DIAGNOSIS — E538 Deficiency of other specified B group vitamins: Secondary | ICD-10-CM | POA: Diagnosis present

## 2024-07-03 DIAGNOSIS — K3189 Other diseases of stomach and duodenum: Secondary | ICD-10-CM | POA: Diagnosis present

## 2024-07-03 DIAGNOSIS — K766 Portal hypertension: Secondary | ICD-10-CM | POA: Diagnosis present

## 2024-07-03 DIAGNOSIS — F1020 Alcohol dependence, uncomplicated: Secondary | ICD-10-CM

## 2024-07-03 DIAGNOSIS — K264 Chronic or unspecified duodenal ulcer with hemorrhage: Secondary | ICD-10-CM | POA: Diagnosis present

## 2024-07-03 DIAGNOSIS — G9341 Metabolic encephalopathy: Secondary | ICD-10-CM | POA: Diagnosis present

## 2024-07-03 DIAGNOSIS — Z8249 Family history of ischemic heart disease and other diseases of the circulatory system: Secondary | ICD-10-CM

## 2024-07-03 DIAGNOSIS — R578 Other shock: Secondary | ICD-10-CM | POA: Diagnosis present

## 2024-07-03 LAB — CBC
HCT: 24.7 % — ABNORMAL LOW (ref 39.0–52.0)
HCT: 27.5 % — ABNORMAL LOW (ref 39.0–52.0)
Hemoglobin: 8.7 g/dL — ABNORMAL LOW (ref 13.0–17.0)
Hemoglobin: 9.8 g/dL — ABNORMAL LOW (ref 13.0–17.0)
MCH: 34.3 pg — ABNORMAL HIGH (ref 26.0–34.0)
MCH: 33.8 pg (ref 26.0–34.0)
MCHC: 35.2 g/dL (ref 30.0–36.0)
MCHC: 35.6 g/dL (ref 30.0–36.0)
MCV: 97.2 fL (ref 80.0–100.0)
MCV: 94.8 fL (ref 80.0–100.0)
Platelets: 102 K/uL — ABNORMAL LOW (ref 150–400)
Platelets: UNDETERMINED K/uL (ref 150–400)
RBC: 2.54 MIL/uL — ABNORMAL LOW (ref 4.22–5.81)
RBC: 2.9 MIL/uL — ABNORMAL LOW (ref 4.22–5.81)
RDW: 18.5 % — ABNORMAL HIGH (ref 11.5–15.5)
RDW: 18.2 % — ABNORMAL HIGH (ref 11.5–15.5)
WBC: 13 K/uL — ABNORMAL HIGH (ref 4.0–10.5)
WBC: 14.3 K/uL — ABNORMAL HIGH (ref 4.0–10.5)
nRBC: 0.5 % — ABNORMAL HIGH (ref 0.0–0.2)
nRBC: 0.6 % — ABNORMAL HIGH (ref 0.0–0.2)

## 2024-07-03 LAB — COMPREHENSIVE METABOLIC PANEL WITH GFR
ALT: 134 U/L — ABNORMAL HIGH (ref 0–44)
ALT: 167 U/L — ABNORMAL HIGH (ref 0–44)
AST: 384 U/L — ABNORMAL HIGH (ref 15–41)
AST: 464 U/L — ABNORMAL HIGH (ref 15–41)
Albumin: 2.1 g/dL — ABNORMAL LOW (ref 3.5–5.0)
Albumin: 2.1 g/dL — ABNORMAL LOW (ref 3.5–5.0)
Alkaline Phosphatase: 171 U/L — ABNORMAL HIGH (ref 38–126)
Alkaline Phosphatase: 152 U/L — ABNORMAL HIGH (ref 38–126)
Anion gap: 31 — ABNORMAL HIGH (ref 5–15)
BUN: 79 mg/dL — ABNORMAL HIGH (ref 6–20)
BUN: 84 mg/dL — ABNORMAL HIGH (ref 6–20)
CO2: 17 mmol/L — ABNORMAL LOW (ref 22–32)
CO2: 17 mmol/L — ABNORMAL LOW (ref 22–32)
Calcium: 8.1 mg/dL — ABNORMAL LOW (ref 8.9–10.3)
Calcium: 7.8 mg/dL — ABNORMAL LOW (ref 8.9–10.3)
Chloride: <65 mmol/L — CL (ref 98–111)
Chloride: 74 mmol/L — ABNORMAL LOW (ref 98–111)
Creatinine, Ser: 4.83 mg/dL — ABNORMAL HIGH (ref 0.61–1.24)
Creatinine, Ser: 4.41 mg/dL — ABNORMAL HIGH (ref 0.61–1.24)
GFR, Estimated: 14 mL/min — ABNORMAL LOW (ref >60)
GFR, Estimated: 15 mL/min — ABNORMAL LOW (ref >60)
Glucose, Bld: 61 mg/dL — ABNORMAL LOW (ref 70–99)
Glucose, Bld: 194 mg/dL — ABNORMAL HIGH (ref 70–99)
Potassium: 3.1 mmol/L — ABNORMAL LOW (ref 3.5–5.1)
Potassium: 3.6 mmol/L (ref 3.5–5.1)
Sodium: 126 mmol/L — ABNORMAL LOW (ref 135–145)
Sodium: 122 mmol/L — ABNORMAL LOW (ref 135–145)
Total Bilirubin: 15.5 mg/dL — ABNORMAL HIGH (ref 0.0–1.2)
Total Bilirubin: 19.2 mg/dL (ref 0.0–1.2)
Total Protein: 4.9 g/dL — ABNORMAL LOW (ref 6.5–8.1)
Total Protein: 4.8 g/dL — ABNORMAL LOW (ref 6.5–8.1)

## 2024-07-03 LAB — FOLATE: Folate: 4.3 ng/mL — ABNORMAL LOW (ref >5.9)

## 2024-07-03 LAB — CK: Total CK: 285 U/L (ref 49–397)

## 2024-07-03 LAB — CBC WITH DIFFERENTIAL/PLATELET
Abs Immature Granulocytes: 0.21 K/uL — ABNORMAL HIGH (ref 0.00–0.07)
Basophils Absolute: 0 K/uL (ref 0.0–0.1)
Basophils Relative: 0 %
Eosinophils Absolute: 0 K/uL (ref 0.0–0.5)
Eosinophils Relative: 0 %
HCT: 21.2 % — ABNORMAL LOW (ref 39.0–52.0)
Hemoglobin: 7.6 g/dL — ABNORMAL LOW (ref 13.0–17.0)
Immature Granulocytes: 2 %
Lymphocytes Relative: 7 %
Lymphs Abs: 1 K/uL (ref 0.7–4.0)
MCH: 34.5 pg — ABNORMAL HIGH (ref 26.0–34.0)
MCHC: 35.8 g/dL (ref 30.0–36.0)
MCV: 96.4 fL (ref 80.0–100.0)
Monocytes Absolute: 0.8 K/uL (ref 0.1–1.0)
Monocytes Relative: 6 %
Neutro Abs: 12.1 K/uL — ABNORMAL HIGH (ref 1.7–7.7)
Neutrophils Relative %: 85 %
Platelets: 130 K/uL — ABNORMAL LOW (ref 150–400)
RBC: 2.2 MIL/uL — ABNORMAL LOW (ref 4.22–5.81)
RDW: 18.5 % — ABNORMAL HIGH (ref 11.5–15.5)
WBC: 14.1 K/uL — ABNORMAL HIGH (ref 4.0–10.5)
nRBC: 0.4 % — ABNORMAL HIGH (ref 0.0–0.2)

## 2024-07-03 LAB — POCT I-STAT 7, (LYTES, BLD GAS, ICA,H+H)
Acid-base deficit: 4 mmol/L — ABNORMAL HIGH (ref 0.0–2.0)
Bicarbonate: 18.8 mmol/L — ABNORMAL LOW (ref 20.0–28.0)
Calcium, Ion: 0.78 mmol/L — CL (ref 1.15–1.40)
HCT: 29 % — ABNORMAL LOW (ref 39.0–52.0)
Hemoglobin: 9.9 g/dL — ABNORMAL LOW (ref 13.0–17.0)
O2 Saturation: 99 %
Patient temperature: 97.6
Potassium: 3.2 mmol/L — ABNORMAL LOW (ref 3.5–5.1)
Sodium: 118 mmol/L — CL (ref 135–145)
TCO2: 20 mmol/L — ABNORMAL LOW (ref 22–32)
pCO2 arterial: 26.9 mmHg — ABNORMAL LOW (ref 32–48)
pH, Arterial: 7.45 (ref 7.35–7.45)
pO2, Arterial: 104 mmHg (ref 83–108)

## 2024-07-03 LAB — GLUCOSE, CAPILLARY
Glucose-Capillary: 142 mg/dL — ABNORMAL HIGH (ref 70–99)
Glucose-Capillary: 55 mg/dL — ABNORMAL LOW (ref 70–99)
Glucose-Capillary: 186 mg/dL — ABNORMAL HIGH (ref 70–99)
Glucose-Capillary: 245 mg/dL — ABNORMAL HIGH (ref 70–99)
Glucose-Capillary: 252 mg/dL — ABNORMAL HIGH (ref 70–99)
Glucose-Capillary: 202 mg/dL — ABNORMAL HIGH (ref 70–99)
Glucose-Capillary: 190 mg/dL — ABNORMAL HIGH (ref 70–99)

## 2024-07-03 LAB — RETICULOCYTES
Immature Retic Fract: 14.2 % (ref 2.3–15.9)
RBC.: 2.18 MIL/uL — ABNORMAL LOW (ref 4.22–5.81)
Retic Count, Absolute: 14.2 K/uL — ABNORMAL LOW (ref 19.0–186.0)
Retic Ct Pct: 0.7 % (ref 0.4–3.1)

## 2024-07-03 LAB — PROTIME-INR
INR: 2.1 — ABNORMAL HIGH (ref 0.8–1.2)
Prothrombin Time: 24.3 s — ABNORMAL HIGH (ref 11.4–15.2)

## 2024-07-03 LAB — I-STAT CG4 LACTIC ACID, ED
Lactic Acid, Venous: >15 mmol/L (ref 0.5–1.9)
Lactic Acid, Venous: >15 mmol/L (ref 0.5–1.9)

## 2024-07-03 LAB — HEMOGLOBIN A1C
Hgb A1c MFr Bld: 5.1 % (ref 4.8–5.6)
Mean Plasma Glucose: 99.67 mg/dL

## 2024-07-03 LAB — PREPARE RBC (CROSSMATCH)

## 2024-07-03 LAB — TROPONIN I (HIGH SENSITIVITY)
Troponin I (High Sensitivity): 25 ng/L — ABNORMAL HIGH (ref <18)
Troponin I (High Sensitivity): 27 ng/L — ABNORMAL HIGH (ref <18)

## 2024-07-03 LAB — MAGNESIUM
Magnesium: 1.7 mg/dL (ref 1.7–2.4)
Magnesium: 1.5 mg/dL — ABNORMAL LOW (ref 1.7–2.4)

## 2024-07-03 LAB — IRON AND TIBC
Iron: 135 ug/dL (ref 45–182)
Saturation Ratios: 104 % — ABNORMAL HIGH (ref 17.9–39.5)
TIBC: 130 ug/dL — ABNORMAL LOW (ref 250–450)

## 2024-07-03 LAB — VITAMIN B12: Vitamin B-12: 2408 pg/mL — ABNORMAL HIGH (ref 180–914)

## 2024-07-03 LAB — PHOSPHORUS: Phosphorus: 7.5 mg/dL — ABNORMAL HIGH (ref 2.5–4.6)

## 2024-07-03 LAB — MRSA NEXT GEN BY PCR, NASAL: MRSA by PCR Next Gen: NOT DETECTED

## 2024-07-03 LAB — FERRITIN: Ferritin: 4034 ng/mL — ABNORMAL HIGH (ref 24–336)

## 2024-07-03 LAB — ETHANOL: Alcohol, Ethyl (B): <15 mg/dL (ref <15)

## 2024-07-03 LAB — LACTIC ACID, PLASMA
Lactic Acid, Venous: >9 mmol/L (ref 0.5–1.9)
Lactic Acid, Venous: >9 mmol/L (ref 0.5–1.9)

## 2024-07-03 LAB — AMMONIA: Ammonia: 77 umol/L — ABNORMAL HIGH (ref 9–35)

## 2024-07-03 MED ORDER — SODIUM CHLORIDE 0.9 % IV SOLN
2.0000 g | Freq: Once | INTRAVENOUS | Status: AC
  Administered 2024-07-03: 2 g via INTRAVENOUS
  Filled 2024-07-03: qty 12.5

## 2024-07-03 MED ORDER — POTASSIUM CHLORIDE 10 MEQ/100ML IV SOLN
10.0000 meq | INTRAVENOUS | Status: AC
  Administered 2024-07-03 (×4): 10 meq via INTRAVENOUS
  Filled 2024-07-03 (×4): qty 100

## 2024-07-03 MED ORDER — ALBUMIN HUMAN 5 % IV SOLN
25.0000 g | Freq: Four times a day (QID) | INTRAVENOUS | Status: AC
  Administered 2024-07-03 – 2024-07-05 (×8): 25 g via INTRAVENOUS
  Filled 2024-07-03 (×8): qty 500

## 2024-07-03 MED ORDER — SODIUM CHLORIDE 0.9% IV SOLUTION: Freq: Once | INTRAVENOUS | Status: AC

## 2024-07-03 MED ORDER — VANCOMYCIN HCL IN DEXTROSE 1-5 GM/200ML-% IV SOLN: 1000.0000 mg | Freq: Once | INTRAVENOUS | Status: DC

## 2024-07-03 MED ORDER — THIAMINE HCL 100 MG/ML IJ SOLN
500.0000 mg | Freq: Once | INTRAVENOUS | Status: AC
  Administered 2024-07-03: 500 mg via INTRAVENOUS
  Filled 2024-07-03: qty 5

## 2024-07-03 MED ORDER — NOREPINEPHRINE 4 MG/250ML-% IV SOLN
0.0000 ug/min | INTRAVENOUS | Status: DC
  Administered 2024-07-03 (×2): 8 ug/min via INTRAVENOUS
  Administered 2024-07-04: 9 ug/min via INTRAVENOUS
  Filled 2024-07-03 (×5): qty 250

## 2024-07-03 MED ORDER — SODIUM CHLORIDE 0.9 % IV SOLN: 250.0000 mL | INTRAVENOUS | Status: AC

## 2024-07-03 MED ORDER — LACTATED RINGERS IV SOLN: INTRAVENOUS | Status: AC

## 2024-07-03 MED ORDER — SODIUM CHLORIDE 0.9 % IV SOLN: 2.0000 g | INTRAVENOUS | Status: DC

## 2024-07-03 MED ORDER — VANCOMYCIN VARIABLE DOSE PER UNSTABLE RENAL FUNCTION (PHARMACIST DOSING): Status: DC

## 2024-07-03 MED ORDER — PIPERACILLIN-TAZOBACTAM 3.375 G IVPB
3.3750 g | Freq: Three times a day (TID) | INTRAVENOUS | Status: DC
  Administered 2024-07-03 – 2024-07-10 (×20): 3.375 g via INTRAVENOUS
  Filled 2024-07-03 (×20): qty 50

## 2024-07-03 MED ORDER — LACTATED RINGERS IV SOLN: INTRAVENOUS | Status: DC

## 2024-07-03 MED ORDER — THIAMINE HCL 100 MG/ML IJ SOLN
100.0000 mg | INTRAMUSCULAR | Status: DC
  Administered 2024-07-04 – 2024-07-05 (×2): 100 mg via INTRAVENOUS
  Filled 2024-07-03 (×2): qty 2

## 2024-07-03 MED ORDER — INSULIN ASPART 100 UNIT/ML IJ SOLN
0.0000 [IU] | Freq: Three times a day (TID) | INTRAMUSCULAR | Status: DC
  Administered 2024-07-03: 2 [IU] via SUBCUTANEOUS
  Administered 2024-07-03: 3 [IU] via SUBCUTANEOUS
  Administered 2024-07-03: 5 [IU] via SUBCUTANEOUS
  Administered 2024-07-04 – 2024-07-05 (×5): 1 [IU] via SUBCUTANEOUS
  Administered 2024-07-05: 2 [IU] via SUBCUTANEOUS
  Administered 2024-07-06 (×2): 3 [IU] via SUBCUTANEOUS
  Administered 2024-07-06: 2 [IU] via SUBCUTANEOUS
  Filled 2024-07-03: qty 2
  Filled 2024-07-03: qty 1
  Filled 2024-07-03: qty 5
  Filled 2024-07-03: qty 2
  Filled 2024-07-03: qty 1
  Filled 2024-07-03: qty 2
  Filled 2024-07-03: qty 1
  Filled 2024-07-03: qty 3
  Filled 2024-07-03: qty 1
  Filled 2024-07-03: qty 2
  Filled 2024-07-03: qty 1
  Filled 2024-07-03: qty 3

## 2024-07-03 MED ORDER — METRONIDAZOLE 500 MG/100ML IV SOLN
500.0000 mg | Freq: Once | INTRAVENOUS | Status: AC
  Administered 2024-07-03: 500 mg via INTRAVENOUS
  Filled 2024-07-03: qty 100

## 2024-07-03 MED ORDER — DEXTROSE 50 % IV SOLN
INTRAVENOUS | Status: AC
  Administered 2024-07-03: 12.5 g via INTRAVENOUS
  Filled 2024-07-03: qty 50

## 2024-07-03 MED ORDER — LORAZEPAM 2 MG/ML IJ SOLN: 1.0000 mg | INTRAMUSCULAR | Status: DC | PRN

## 2024-07-03 MED ORDER — VANCOMYCIN HCL 2000 MG/400ML IV SOLN
2000.0000 mg | Freq: Once | INTRAVENOUS | Status: AC
  Administered 2024-07-03: 2000 mg via INTRAVENOUS
  Filled 2024-07-03: qty 400

## 2024-07-03 MED ORDER — MAGNESIUM SULFATE 4 GM/100ML IV SOLN
4.0000 g | Freq: Once | INTRAVENOUS | Status: AC
  Administered 2024-07-03: 4 g via INTRAVENOUS
  Filled 2024-07-03: qty 100

## 2024-07-03 MED ORDER — SODIUM CHLORIDE 0.9 % IV BOLUS
1000.0000 mL | Freq: Once | INTRAVENOUS | Status: AC
  Administered 2024-07-03: 1000 mL via INTRAVENOUS

## 2024-07-03 MED ORDER — PANTOPRAZOLE SODIUM 40 MG IV SOLR
40.0000 mg | Freq: Two times a day (BID) | INTRAVENOUS | Status: DC
  Administered 2024-07-03 – 2024-07-12 (×19): 40 mg via INTRAVENOUS
  Filled 2024-07-03 (×19): qty 10

## 2024-07-03 MED ORDER — NOREPINEPHRINE 4 MG/250ML-% IV SOLN
0.0000 ug/min | INTRAVENOUS | Status: DC
  Administered 2024-07-03: 2 ug/min via INTRAVENOUS
  Filled 2024-07-03: qty 250

## 2024-07-03 MED ORDER — SODIUM CHLORIDE 0.9 % IV SOLN
50.0000 ug/h | INTRAVENOUS | Status: DC
  Administered 2024-07-03 – 2024-07-08 (×12): 50 ug/h via INTRAVENOUS
  Filled 2024-07-03 (×13): qty 1

## 2024-07-03 MED ORDER — METRONIDAZOLE 500 MG/100ML IV SOLN: 500.0000 mg | Freq: Two times a day (BID) | INTRAVENOUS | Status: DC

## 2024-07-03 MED ORDER — LACTATED RINGERS IV BOLUS
1000.0000 mL | Freq: Once | INTRAVENOUS | Status: AC
  Administered 2024-07-03: 1000 mL via INTRAVENOUS

## 2024-07-03 MED ORDER — LACTULOSE 10 GM/15ML PO SOLN
10.0000 g | Freq: Three times a day (TID) | ORAL | Status: DC
  Administered 2024-07-03 (×2): 10 g via ORAL
  Filled 2024-07-03 (×2): qty 15

## 2024-07-03 MED ORDER — DOCUSATE SODIUM 100 MG PO CAPS: 100.0000 mg | ORAL_CAPSULE | Freq: Two times a day (BID) | ORAL | Status: DC | PRN

## 2024-07-03 MED ORDER — POLYETHYLENE GLYCOL 3350 17 G PO PACK
17.0000 g | PACK | Freq: Every day | ORAL | Status: DC | PRN
Start: 2024-07-03 — End: 2024-07-12

## 2024-07-03 MED ORDER — OCTREOTIDE LOAD VIA INFUSION
50.0000 ug | Freq: Once | INTRAVENOUS | Status: AC
  Administered 2024-07-03: 50 ug via INTRAVENOUS
  Filled 2024-07-03: qty 25

## 2024-07-03 MED ORDER — PANTOPRAZOLE SODIUM 40 MG IV SOLR
40.0000 mg | Freq: Once | INTRAVENOUS | Status: AC
  Administered 2024-07-03: 40 mg via INTRAVENOUS
  Filled 2024-07-03: qty 10

## 2024-07-03 MED ORDER — POTASSIUM CHLORIDE 20 MEQ PO PACK
40.0000 meq | PACK | Freq: Once | ORAL | Status: AC
  Administered 2024-07-03: 40 meq via ORAL
  Filled 2024-07-03: qty 2

## 2024-07-03 MED ORDER — CHLORHEXIDINE GLUCONATE CLOTH 2 % EX PADS
6.0000 | MEDICATED_PAD | Freq: Every day | CUTANEOUS | Status: DC
  Administered 2024-07-03 – 2024-07-12 (×10): 6 via TOPICAL

## 2024-07-03 MED ORDER — DEXTROSE 50 % IV SOLN: 12.5000 g | INTRAVENOUS | Status: AC

## 2024-07-03 MED ORDER — NICOTINE 21 MG/24HR TD PT24
21.0000 mg | MEDICATED_PATCH | Freq: Once | TRANSDERMAL | Status: AC
  Administered 2024-07-03: 21 mg via TRANSDERMAL
  Filled 2024-07-03: qty 1

## 2024-07-03 MED ORDER — PHENOBARBITAL SODIUM 130 MG/ML IJ SOLN
130.0000 mg | Freq: Every day | INTRAMUSCULAR | Status: DC
  Administered 2024-07-03 – 2024-07-05 (×3): 130 mg via INTRAVENOUS
  Filled 2024-07-03 (×4): qty 1

## 2024-07-03 MED ORDER — DEXTROSE IN LACTATED RINGERS 5 % IV SOLN: INTRAVENOUS | Status: DC

## 2024-07-03 NOTE — ED Notes (Signed)
 Verbal order to remove c-collar from Ryan Slocumb, PA

## 2024-07-03 NOTE — Progress Notes (Signed)
 Pharmacy Antibiotic Note  [name] is a 53 y.o. male admitted on 07/03/2024 with AKI and possible septic shock.  Pharmacy has been consulted for Vancomycin  and Cefepime   dosing.  Vancomycin  2000 mg IV given in ED  Plan: F/U renal function and redose Vancomycin  as indicated Cefepime  2 g IV q24h   Height: 6' (182.9 cm) Weight: 97.5 kg (214 lb 15.2 oz) IBW/kg (Calculated) : 77.6  Temp (24hrs), Avg:95.2 F (35.1 C), Min:94.3 F (34.6 C), Max:96.1 F (35.6 C)  Recent Labs  Lab 07/03/24 0257 07/03/24 0304  WBC 14.1*  --   CREATININE 4.83*  --   LATICACIDVEN  --  >15.0*    Estimated Creatinine Clearance: 21.9 mL/min (A) (by C-G formula based on SCr of 4.83 mg/dL (H)).    Allergies  Allergen Reactions   Bupropion Other (See Comments)    Caused seizures, Feel as though I will have a seizure when taking this med    [name] 07/03/2024 4:57 AM

## 2024-07-03 NOTE — TOC Initial Note (Signed)
 Transition of Care [facility]) - Initial/Assessment Note    Patient Details  Name: [name] MRN: [id number] Date of Birth: Mar 15, 1971  Transition of Care [facility] CM/SW Contact:    Isaiah Public, LCSWA Phone Number: 07/03/2024, 3:46 PM  Clinical Narrative:                  CSW spoke with patient. Patient reports PTA he comes from home. Patient reports his plan at dc is to return home. Patient reports he will have transportation when ready for dc. Patient agreeable for CSW to attach outpatient substance use treatment services resources to his AVS. Patient confirms that he has a PCP Dr. Raguel Blush. All questions answered. No further questions reported at this time. Expected Discharge Plan: Home/Self Care Barriers to Discharge: Continued Medical Work up   Patient Goals and CMS Choice Patient states their goals for this hospitalization and ongoing recovery are:: to return home          Expected Discharge Plan and Services In-house Referral: Clinical Social Work     Living arrangements for the past 2 months: Single Family Home                                      Prior Living Arrangements/Services Living arrangements for the past 2 months: Single Family Home Lives with:: Self Patient language and need for interpreter reviewed:: Yes Do you feel safe going back to the place where you live?: Yes            Criminal Activity/Legal Involvement Pertinent to Current Situation/Hospitalization: No - Comment as needed  Activities of Daily Living      Permission Sought/Granted Permission sought to share information with : Case Manager, Magazine Features Editor, Family Supports                Emotional Assessment   Attitude/Demeanor/Rapport: Gracious Affect (typically observed): Calm Orientation: : Oriented to Self, Oriented to Place, Oriented to  Time, Oriented to Situation Alcohol  / Substance Use:  (Patient accepts outpatient substance use treatment  services resources) Psych Involvement: No (comment)  Admission diagnosis:  Hypokalemia [E87.6] Hypochloremia [E87.8] Shock (HCC) [R57.9] Acute upper GI bleeding [K92.2] Jaundice [R17] Fall, initial encounter [W19.XXXA] Acute renal failure, unspecified acute renal failure type [N17.9] Alcoholic cirrhosis, unspecified whether ascites present [facility]) [K70.30] Patient Active Problem List   Diagnosis Date Noted   Shock (HCC) 07/03/2024   Alcohol  withdrawal seizure (HCC) 03/06/2024   Seizure (HCC) 01/08/2024   Hypervolemic hyponatremia 03/05/2023   Hypocalcemia 03/05/2023   Hepatic cirrhosis (HCC) 03/05/2023   History of seizure 03/05/2023   Leukocytosis 03/05/2023   Chronic anemia 03/05/2023   Schizoaffective disorder (HCC) 03/05/2023   Transaminitis 03/05/2023   Elevated bilirubin 03/05/2023   Hypochloremia 03/05/2023   Alcohol  withdrawal (HCC) 02/02/2022   Hyperammonemia 02/02/2022   Alcohol  withdrawal syndrome without complication (HCC) 01/10/2022   Acute gout 01/10/2022   Elevated liver function tests    Nausea and vomiting    Trigger finger, right middle finger 05/20/2021   Alcoholic cirrhosis of liver with ascites (HCC) 03/08/2021   Polysubstance abuse (HCC) 03/08/2021   Secondary esophageal varices without bleeding (HCC) 03/08/2021   Macrocytic anemia    Hepatic steatosis    Hypomagnesemia 05/03/2020   Hyperbilirubinemia 05/03/2020   Alcoholism (HCC) 05/03/2020   Alcoholic hepatitis without ascites (HCC) 03/06/2019   Essential hypertension 01/10/2019   Alcohol  intoxication 01/10/2019  Schizoaffective disorder, bipolar type (HCC) 06/02/2018   Alcohol -induced mood disorder (HCC) 02/22/2016   Gout 10/22/2015   GERD (gastroesophageal reflux disease) 10/22/2015   Schizoaffective disorder, depressive type (HCC)    History of alcohol  abuse    Psychosis (HCC) 05/13/2015   Tobacco dependence due to cigarettes 11/20/2014   Abnormal LFTs    Alcohol  abuse 02/09/2014   PTSD  (post-traumatic stress disorder) 11/03/2011   Hypokalemia 07/02/2011   PCP:  Tanda Bleacher, MD Pharmacy:   [facility] [phone] [address], [place] - [address] AT [facility] [address] [place] [address] Phone: [phone] Fax: [phone]  [id number] - [place], [place] - [address] [place] [address] Phone: [phone] Fax: [phone]     Social Drivers of Health (SDOH) Social History: SDOH Screenings   Food Insecurity: Food Insecurity Present (03/06/2024)  Housing: Low Risk  (03/06/2024)  Recent Concern: Housing - High Risk (01/08/2024)  Transportation Needs: Unmet Transportation Needs (03/06/2024)  Utilities: Not At Risk (03/06/2024)  Recent Concern: Utilities - At Risk (01/08/2024)  Alcohol  Screen: High Risk (12/01/2023)  Depression (PHQ2-9): Medium Risk (01/28/2024)  Financial Resource Strain: High Risk (12/01/2023)  Physical Activity: Insufficiently Active (12/01/2023)  Social Connections: Moderately Integrated (12/01/2023)  Stress: Stress Concern Present (12/01/2023)  Tobacco Use: High Risk (03/08/2024)  Health Literacy: Adequate Health Literacy (12/01/2023)   SDOH Interventions:     Readmission Risk Interventions    03/09/2024    1:39 PM  Readmission Risk Prevention Plan  Transportation Screening Complete  Medication Review (RN Care Manager) Complete  PCP or Specialist appointment within 3-5 days of discharge Complete  HRI or Home Care Consult Complete  SW Recovery Care/Counseling Consult Complete  Palliative Care Screening Not Applicable  Skilled Nursing Facility Not Applicable

## 2024-07-03 NOTE — Progress Notes (Addendum)
 NAME:  [name], MRN:  [id number], DOB:  07/31/1971, LOS: 0 ADMISSION DATE:  07/03/2024, CONSULTATION DATE:  07/03/24 REFERRING MD:  EDP CHIEF COMPLAINT:  Shock   History of Present Illness:  [name] is a 53 year old male, daily smoker with history of alcohol  dependence, withdrawal seizures, liver cirrhosis, PTSD, and schizoaffective disorder who is brought in by EMS after falling and being found on the floor covered in urine and feces.   In the ER, vitals showed temp of 94.54F, BP 74/39, RR 25, Spo2 94% on 4L O2.   Labs show Na 126, K 3.1, Cl <65, Serum bicarb 17, Glucose 61, Cr 4.83 (baseline 0.79), Alk phos 171, albumin  2.1, AST/ALT 384/134, Ammonia 77, T. Bili 15.5, CK 285, Trop 25. CBC shows WBC 14, Hgb 7.6g/dL, plts 869. Lactic acid >15.  He reports throwing up dark blood over the past 2 days. He has epigastric discomfort. He is alert to person and time but not place.   CT Head without acute abnormality. CT C-spine without acute abnormality. Chest radiograph unremarkable.  PCCM called for admission.   Pertinent  Medical History   Past Medical History:  Diagnosis Date   Alcohol  abuse    Anxiety    at age 53   Blood transfusion without reported diagnosis    Cirrhosis (HCC)    Depression    at age 54   Epileptic seizure (HCC)    Not associated with EtOH withdrawal   GERD (gastroesophageal reflux disease)    Hypertension    Nausea and vomiting    Psychosis (HCC)    PTSD (post-traumatic stress disorder)    Schizoaffective disorder    Seizure disorder (HCC)    related to etoh seizure   Symptomatic anemia    Significant Hospital Events: Including procedures, antibiotic start and stop dates in addition to other pertinent events   11/16 admitted to ICU for shock.  Interim History / Subjective:  No more bleeding events, Hb 9.9 s/p 1 PRBC.  Second PRBC was DC'd Sats well, conversational, stable on Protonix  twice daily, octreotide, restarted home lactulose  20 3  times daily  Objective    Blood pressure (!) 124/53, pulse 77, temperature (!) 96.1 F (35.6 C), temperature source Axillary, resp. rate (!) 26, height 6' (1.829 m), weight 97.5 kg, SpO2 94%.        Intake/Output Summary (Last 24 hours) at 07/03/2024 1024 Last data filed at 07/03/2024 1017 Gross per 24 hour  Intake 4479.01 ml  Output --  Net 4479.01 ml   Filed Weights   07/03/24 0220  Weight: 97.5 kg    Examination: General: Chronically ill appearing male, not in distress HEENT: NCAT: Dry mucous membranes Lungs: Clear to auscultation, chest symmetrical chest wall movement CVS: S1-S2 normal, no murmur or regurg Abdomen: Soft nontender distended Extremities: Warm, no edema Neuro: AO X2, no focal deficit, intact motor and sensory GU: Deferred   Labs and imaging reviewed as in HPI  Resolved problem list   Assessment and Plan   Shock - hemorrhagic vs septic - ordered for 2 units PRBCs - received 1L of LR and maintenance rate of 150mL/hr - start peripheral levophed  for MAP goal 65 or greater - follow up blood cultures - started on vancomycin , cefepime  and flagyl  - trend lactic acid level  GI Bleed Acute Blood Loss Anemia - start PPI IV BID - start octreotide - receiving 2 units PRBCs - transfuse for hemoglobin 7g/dL or less - GI consult during the  day  AKI Lactic Acidosis Hyponatremia Hypokalemia - CK level not elevated - continue fluid resuscitation - trend lactic acid level  Alcoholic Cirrhosis Elevated Alk phos and LFTs Hyperbilirubinemia - Child Pugh Score 11, Class C,  - MELD-Na score 39 - check RUQ US  to rule out portal vein thrombosis and for ascites  Acute Metabolic Encephalopathy - supportive care as above - thiamine  high dose now, then 100mg  daily  Thrombocytopenia Coagulopathy - monitor   Big things: Managing for GI bleed Hb stable, on Protonix  PPI, octreotide, pending EGD with GI.  On phenobarb for high risk of alcohol  withdrawal,  and on empiric antibiotics for septic shock   Labs   CBC: Recent Labs  Lab 07/03/24 0257 07/03/24 0448 07/03/24 0640  WBC 14.1* 13.0*  --   NEUTROABS 12.1*  --   --   HGB 7.6* 8.7* 9.9*  HCT 21.2* 24.7* 29.0*  MCV 96.4 97.2  --   PLT 130* 102*  --     Basic Metabolic Panel: Recent Labs  Lab 07/03/24 0257 07/03/24 0448 07/03/24 0640  NA 126*  --  118*  K 3.1*  --  3.2*  CL <65*  --   --   CO2 17*  --   --   GLUCOSE 61*  --   --   BUN 79*  --   --   CREATININE 4.83*  --   --   CALCIUM  8.1*  --   --   MG 1.7 1.5*  --   PHOS  --  7.5*  --    GFR: Estimated Creatinine Clearance: 21.9 mL/min (A) (by C-G formula based on SCr of 4.83 mg/dL (H)). Recent Labs  Lab 07/03/24 0257 07/03/24 0304 07/03/24 0448 07/03/24 0510  WBC 14.1*  --  13.0*  --   LATICACIDVEN  --  >15.0*  --  >15.0*    Liver Function Tests: Recent Labs  Lab 07/03/24 0257  AST 384*  ALT 134*  ALKPHOS 171*  BILITOT 15.5*  PROT 4.9*  ALBUMIN  2.1*   No results for input(s): LIPASE, AMYLASE in the last 168 hours. Recent Labs  Lab 07/03/24 0257  AMMONIA 77*    ABG    Component Value Date/Time   PHART 7.450 07/03/2024 0640   PCO2ART 26.9 (L) 07/03/2024 0640   PO2ART 104 07/03/2024 0640   HCO3 18.8 (L) 07/03/2024 0640   TCO2 20 (L) 07/03/2024 0640   ACIDBASEDEF 4.0 (H) 07/03/2024 0640   O2SAT 99 07/03/2024 0640     Coagulation Profile: Recent Labs  Lab 07/03/24 0257  INR 2.1*    Cardiac Enzymes: Recent Labs  Lab 07/03/24 0257  CKTOTAL 285    HbA1C: Hgb A1c MFr Bld  Date/Time Value Ref Range Status  09/07/2021 03:01 PM 5.5 4.8 - 5.6 % Final    Comment:    (NOTE)         Prediabetes: 5.7 - 6.4         Diabetes: >6.4         Glycemic control for adults with diabetes: <7.0   12/27/2020 12:00 AM 5.2 4.8 - 5.6 % Final    Comment:             Prediabetes: 5.7 - 6.4          Diabetes: >6.4          Glycemic control for adults with diabetes: <7.0     CBG: Recent  Labs  Lab 07/03/24 0543 07/03/24 0605 07/03/24 0757  GLUCAP 55* 142* 186*    Review of Systems:   Review of Systems  Constitutional:  Positive for malaise/fatigue. Negative for chills, fever and weight loss.  HENT:  Negative for congestion, sinus pain and sore throat.   Eyes: Negative.   Respiratory:  Negative for cough, hemoptysis, sputum production, shortness of breath and wheezing.   Cardiovascular:  Negative for chest pain, palpitations, orthopnea, claudication and leg swelling.  Gastrointestinal:  Positive for blood in stool, nausea and vomiting. Negative for abdominal pain and heartburn.  Genitourinary: Negative.   Musculoskeletal:  Negative for joint pain and myalgias.  Skin:  Negative for rash.  Neurological:  Positive for weakness.  Endo/Heme/Allergies: Negative.   Psychiatric/Behavioral:  Positive for depression.    Past Medical History:  He,  has a past medical history of Alcohol  abuse, Anxiety, Blood transfusion without reported diagnosis, Cirrhosis (HCC), Depression, Epileptic seizure (HCC), GERD (gastroesophageal reflux disease), Hypertension, Nausea and vomiting, Psychosis (HCC), PTSD (post-traumatic stress disorder), Schizoaffective disorder, Seizure disorder (HCC), and Symptomatic anemia.   Surgical History:   Past Surgical History:  Procedure Laterality Date   ESOPHAGOGASTRODUODENOSCOPY (EGD) WITH PROPOFOL  N/A 05/05/2020   Procedure: ESOPHAGOGASTRODUODENOSCOPY (EGD) WITH PROPOFOL ;  Surgeon: Shila Gustav GAILS, MD;  Location: [facility];  Service: Endoscopy;  Laterality: N/A;   FOOT SURGERY     right   HAND SURGERY       Social History:   reports that he has been smoking cigarettes. He has a 45 pack-year smoking history. He has never used smokeless tobacco. He reports current alcohol  use. He reports current drug use. Frequency: 1.00 time per week. Drug: Marijuana.   Family History:  His family history includes Alcoholism in his mother; CAD in his father;  Depression in his brother. There is no history of Colon polyps, Esophageal cancer, Pancreatic cancer, or Stomach cancer.   Allergies Allergies  Allergen Reactions   Bupropion Other (See Comments)    Caused seizures, Feel as though I will have a seizure when taking this med     Home Medications  Prior to Admission medications   Medication Sig Start Date End Date Taking? Authorizing Provider  allopurinol  (ZYLOPRIM ) 100 MG tablet Take 1 tablet (100 mg total) by mouth daily. 01/15/24   Elgergawy, Brayton RAMAN, MD  folic acid  (FOLVITE ) 1 MG tablet Take 1 tablet (1 mg total) by mouth daily. 03/11/24   Hongalgi, Anand D, MD  gabapentin  (NEURONTIN ) 300 MG capsule Take 1 capsule (300 mg total) by mouth 2 (two) times daily. 01/15/24   Elgergawy, Brayton RAMAN, MD  Lactulose  20 GM/30ML SOLN Take 15 mLs (10 g total) by mouth 3 (three) times daily. 06/30/24   Tanda Bleacher, MD  levETIRAcetam  (KEPPRA ) 500 MG tablet Take 1 tablet (500 mg total) by mouth 2 (two) times daily. 03/10/24 05/09/24  Hongalgi, Anand D, MD  Multiple Vitamin (MULTIVITAMIN WITH MINERALS) TABS tablet Take 1 tablet by mouth daily. 03/11/24   Hongalgi, Anand D, MD  pantoprazole  (PROTONIX ) 40 MG tablet Take 1 tablet (40 mg total) by mouth daily. 06/21/24   Tanda Bleacher, MD  QUEtiapine  (SEROQUEL ) 200 MG tablet Take 1 tablet (200 mg total) by mouth at bedtime. 06/21/24 06/21/25  Tanda Bleacher, MD  sertraline  (ZOLOFT ) 25 MG tablet Take 1 tablet (25 mg total) by mouth daily. 03/12/23 03/11/24  Von Bellis, MD  thiamine  (VITAMIN B-1) 100 MG tablet Take 1 tablet (100 mg total) by mouth daily. 03/11/24   Judeth Trenda BIRCH, MD     Critical care time:  20 minutes     Lenny Drought, MD  [facility] Prefer epic messenger for cross cover needs    Critical care time was exclusive of separately billable procedures and treating other patients.  Critical care was necessary to treat or prevent imminent or life-threatening  deterioration.  Critical care was time spent personally by me on the following activities: development of treatment plan with patient and/or surrogate as well as nursing, discussions with consultants, evaluation of patient's response to treatment, examination of patient, obtaining history from patient or surrogate, ordering and performing treatments and interventions, ordering and review of laboratory studies, ordering and review of radiographic studies, pulse oximetry, re-evaluation of patient's condition and participation in multidisciplinary rounds.

## 2024-07-03 NOTE — ED Notes (Signed)
Critical care MD at the bedside. 

## 2024-07-03 NOTE — Progress Notes (Addendum)
 eLink Physician-Brief Progress Note Patient Name: Andrews Tener DOB: 09-28-1970 MRN: [id number]   Date of Service  07/03/2024  HPI/Events of Note  53 year old male with a history of alcohol  use disorder, cirrhosis, PTSD and schizoaffective disorder who presents to the hospital covered in urine and feces in shock with concern for acute GI bleed.  Vital signs are within normal limits saturating 95% on supplemental nasal cannula, running norepinephrine  infusion.  Results consistent with multiple electrolyte disturbances, elevated creatinine, transaminitis, severe lactic acidosis, normocytic anemia and thrombocytopenia.  Imaging reviewed.  eICU Interventions  Maintain IV pantoprazole  twice daily, octreotide, transfuse for hemoglobin less than 7  Electrolyte repletion, check CBG every 4 hours, monitor for hypoglycemia  Trend lactic acid.  Maintain norepinephrine  as needed for goal MAP greater than 65.  Obtain ABG  Cultures collected, empiric antibiotics  CIWA protocol, may need to initiate trigger based therapy  DVT prophylaxis with SCDs, chemoprophylaxis contraindicated with active bleeding GI prophylaxis with therapeutic PPI   0647 -change blood to emergent release, patient unable to directly consent due to confusion.   Intervention Category Evaluation Type: New Patient Evaluation  Boyde Grieco 07/03/2024, 6:02 AM

## 2024-07-03 NOTE — Sepsis Progress Note (Signed)
 Elink following for sepsis protocol.

## 2024-07-03 NOTE — H&P (Signed)
 NAME:  [name], MRN:  [id number], DOB:  [birth year], LOS: 0 ADMISSION DATE:  07/03/2024, CONSULTATION DATE:  07/03/24 REFERRING MD:  EDP CHIEF COMPLAINT:  Shock   History of Present Illness:  [name] is a 53 year old male, daily smoker with history of alcohol  dependence, withdrawal seizures, liver cirrhosis, PTSD, and schizoaffective disorder who is brought in by EMS after falling and being found on the floor covered in urine and feces.   In the ER, vitals showed temp of 94.57F, BP 74/39, RR 25, Spo2 94% on 4L O2.   Labs show Na 126, K 3.1, Cl <65, Serum bicarb 17, Glucose 61, Cr 4.83 (baseline 0.79), Alk phos 171, albumin  2.1, AST/ALT 384/134, Ammonia 77, T. Bili 15.5, CK 285, Trop 25. CBC shows WBC 14, Hgb 7.6g/dL, plts 869. Lactic acid >15.  He reports throwing up dark blood over the past 2 days. He has epigastric discomfort. He is alert to person and time but not place.   CT Head without acute abnormality. CT C-spine without acute abnormality. Chest radiograph unremarkable.  PCCM called for admission.   Pertinent  Medical History   Past Medical History:  Diagnosis Date   Alcohol  abuse    Anxiety    at age 65   Blood transfusion without reported diagnosis    Cirrhosis (HCC)    Depression    at age 22   Epileptic seizure (HCC)    Not associated with EtOH withdrawal   GERD (gastroesophageal reflux disease)    Hypertension    Nausea and vomiting    Psychosis (HCC)    PTSD (post-traumatic stress disorder)    Schizoaffective disorder    Seizure disorder (HCC)    related to etoh seizure   Symptomatic anemia    Significant Hospital Events: Including procedures, antibiotic start and stop dates in addition to other pertinent events   11/16 admitted to ICU for shock.  Interim History / Subjective:  As above  Objective    Blood pressure (!) 78/46, pulse 80, temperature (!) 94.3 F (34.6 C), temperature source Rectal, resp. rate (!) 26, height 6' (1.829 m),  weight 97.5 kg, SpO2 98%.        Intake/Output Summary (Last 24 hours) at 07/03/2024 0418 Last data filed at 07/03/2024 0403 Gross per 24 hour  Intake 100.05 ml  Output --  Net 100.05 ml   Filed Weights   07/03/24 0220  Weight: 97.5 kg    Examination: General: chronically ill appearing male, no distress HENT: [place]/AT, dry mucous membranes, icteric sclera Lungs: clear to auscultation Cardiovascular: rrr, no murmurs Abdomen: soft, mild tender in epigastric area, BS+ Extremities: no edema Neuro: moving all extremities, A&Ox2 GU: n/a  Labs and imaging reviewed as in HPI  Resolved problem list   Assessment and Plan   Shock - hemorrhagic vs septic - ordered for 2 units PRBCs - received 1L of LR and maintenance rate of 170mL/hr - start peripheral levophed  for MAP goal 65 or greater - follow up blood cultures - started on vancomycin , cefepime  and flagyl  - trend lactic acid level  GI Bleed Acute Blood Loss Anemia - start PPI IV BID - start octreotide - receiving 2 units PRBCs - transfuse for hemoglobin 7g/dL or less - GI consult during the day  AKI Lactic Acidosis Hyponatremia Hypokalemia - CK level not elevated - continue fluid resuscitation - trend lactic acid level  Alcoholic Cirrhosis Elevated Alk phos and LFTs Hyperbilirubinemia - Child Pugh Score 11, Class C,  -  MELD-Na score 39 - check RUQ US  to rule out portal vein thrombosis and for ascites  Acute Metabolic Encephalopathy - supportive care as above - thiamine  high dose now, then 100mg  daily  Thrombocytopenia Coagulopathy - monitor   Labs   CBC: Recent Labs  Lab 07/03/24 0257  WBC 14.1*  NEUTROABS 12.1*  HGB 7.6*  HCT 21.2*  MCV 96.4  PLT 130*    Basic Metabolic Panel: Recent Labs  Lab 07/03/24 0257  NA 126*  K 3.1*  CL <65*  CO2 17*  GLUCOSE 61*  BUN 79*  CREATININE 4.83*  CALCIUM  8.1*  MG 1.7   GFR: Estimated Creatinine Clearance: 21.9 mL/min (A) (by C-G formula based  on SCr of 4.83 mg/dL (H)). Recent Labs  Lab 07/03/24 0257 07/03/24 0304  WBC 14.1*  --   LATICACIDVEN  --  >15.0*    Liver Function Tests: Recent Labs  Lab 07/03/24 0257  AST 384*  ALT 134*  ALKPHOS 171*  BILITOT 15.5*  PROT 4.9*  ALBUMIN  2.1*   No results for input(s): LIPASE, AMYLASE in the last 168 hours. Recent Labs  Lab 07/03/24 0257  AMMONIA 77*    ABG    Component Value Date/Time   PHART 7.556 (H) 07/20/2020 0920   PCO2ART 32.6 07/20/2020 0920   PO2ART 47 (L) 07/20/2020 0920   HCO3 28.9 (H) 07/20/2020 0920   TCO2 30 07/20/2020 0920   O2SAT 89.0 07/20/2020 0920     Coagulation Profile: Recent Labs  Lab 07/03/24 0257  INR 2.1*    Cardiac Enzymes: Recent Labs  Lab 07/03/24 0257  CKTOTAL 285    HbA1C: Hgb A1c MFr Bld  Date/Time Value Ref Range Status  09/07/2021 03:01 PM 5.5 4.8 - 5.6 % Final    Comment:    (NOTE)         Prediabetes: 5.7 - 6.4         Diabetes: >6.4         Glycemic control for adults with diabetes: <7.0   12/27/2020 12:00 AM 5.2 4.8 - 5.6 % Final    Comment:             Prediabetes: 5.7 - 6.4          Diabetes: >6.4          Glycemic control for adults with diabetes: <7.0     CBG: No results for input(s): GLUCAP in the last 168 hours.  Review of Systems:   Review of Systems  Constitutional:  Positive for malaise/fatigue. Negative for chills, fever and weight loss.  HENT:  Negative for congestion, sinus pain and sore throat.   Eyes: Negative.   Respiratory:  Negative for cough, hemoptysis, sputum production, shortness of breath and wheezing.   Cardiovascular:  Negative for chest pain, palpitations, orthopnea, claudication and leg swelling.  Gastrointestinal:  Positive for blood in stool, nausea and vomiting. Negative for abdominal pain and heartburn.  Genitourinary: Negative.   Musculoskeletal:  Negative for joint pain and myalgias.  Skin:  Negative for rash.  Neurological:  Positive for weakness.   Endo/Heme/Allergies: Negative.   Psychiatric/Behavioral:  Positive for depression.    Past Medical History:  He,  has a past medical history of Alcohol  abuse, Anxiety, Blood transfusion without reported diagnosis, Cirrhosis (HCC), Depression, Epileptic seizure (HCC), GERD (gastroesophageal reflux disease), Hypertension, Nausea and vomiting, Psychosis (HCC), PTSD (post-traumatic stress disorder), Schizoaffective disorder, Seizure disorder (HCC), and Symptomatic anemia.   Surgical History:   Past Surgical History:  Procedure  Laterality Date   ESOPHAGOGASTRODUODENOSCOPY (EGD) WITH PROPOFOL  N/A 05/05/2020   Procedure: ESOPHAGOGASTRODUODENOSCOPY (EGD) WITH PROPOFOL ;  Surgeon: Shila Gustav GAILS, MD;  Location: [facility];  Service: Endoscopy;  Laterality: N/A;   FOOT SURGERY     right   HAND SURGERY       Social History:   reports that he has been smoking cigarettes. He has a 45 pack-year smoking history. He has never used smokeless tobacco. He reports current alcohol  use. He reports current drug use. Frequency: 1.00 time per week. Drug: Marijuana.   Family History:  His family history includes Alcoholism in his mother; CAD in his father; Depression in his brother. There is no history of Colon polyps, Esophageal cancer, Pancreatic cancer, or Stomach cancer.   Allergies Allergies  Allergen Reactions   Bupropion Other (See Comments)    Caused seizures, Feel as though I will have a seizure when taking this med     Home Medications  Prior to Admission medications   Medication Sig Start Date End Date Taking? Authorizing Provider  allopurinol  (ZYLOPRIM ) 100 MG tablet Take 1 tablet (100 mg total) by mouth daily. 01/15/24   Elgergawy, Brayton RAMAN, MD  folic acid  (FOLVITE ) 1 MG tablet Take 1 tablet (1 mg total) by mouth daily. 03/11/24   Hongalgi, Anand D, MD  gabapentin  (NEURONTIN ) 300 MG capsule Take 1 capsule (300 mg total) by mouth 2 (two) times daily. 01/15/24   Elgergawy, Brayton RAMAN, MD   Lactulose  20 GM/30ML SOLN Take 15 mLs (10 g total) by mouth 3 (three) times daily. 06/30/24   Tanda Bleacher, MD  levETIRAcetam  (KEPPRA ) 500 MG tablet Take 1 tablet (500 mg total) by mouth 2 (two) times daily. 03/10/24 05/09/24  Hongalgi, Anand D, MD  Multiple Vitamin (MULTIVITAMIN WITH MINERALS) TABS tablet Take 1 tablet by mouth daily. 03/11/24   Hongalgi, Anand D, MD  pantoprazole  (PROTONIX ) 40 MG tablet Take 1 tablet (40 mg total) by mouth daily. 06/21/24   Tanda Bleacher, MD  QUEtiapine  (SEROQUEL ) 200 MG tablet Take 1 tablet (200 mg total) by mouth at bedtime. 06/21/24 06/21/25  Tanda Bleacher, MD  sertraline  (ZOLOFT ) 25 MG tablet Take 1 tablet (25 mg total) by mouth daily. 03/12/23 03/11/24  Von Bellis, MD  thiamine  (VITAMIN B-1) 100 MG tablet Take 1 tablet (100 mg total) by mouth daily. 03/11/24   Judeth Trenda BIRCH, MD     Critical care time: 40 minutes    CRITICAL CARE Performed by: Dorn KATHEE Chill   Total critical care time: 40 minutes  Critical care time was exclusive of separately billable procedures and treating other patients.  Critical care was necessary to treat or prevent imminent or life-threatening deterioration.  Critical care was time spent personally by me on the following activities: development of treatment plan with patient and/or surrogate as well as nursing, discussions with consultants, evaluation of patient's response to treatment, examination of patient, obtaining history from patient or surrogate, ordering and performing treatments and interventions, ordering and review of laboratory studies, ordering and review of radiographic studies, pulse oximetry, re-evaluation of patient's condition and participation in multidisciplinary rounds.  Dorn Chill, MD [facility] Office: [phone]   See Amion for personal pager PCCM on call pager [phone] until 7pm. Please call Elink 7p-7a. [phone]

## 2024-07-03 NOTE — Discharge Instructions (Signed)

## 2024-07-03 NOTE — ED Provider Notes (Signed)
 I provided a substantive portion of the care of this patient.  I personally made/approved the management plan for this patient and take responsibility for the patient management.   Here from home for a fall that with weakness for the last 24 hours at least.  Patient apparently was found in a pile of cigarette butts, alcohol  and human excrement.  Patient states he does not hurt when being left alone but when he moves around he can of hurts all over.  On exam he is got significant jaundice and scleral icterus, dark dried material around his lips he states he has been vomiting what looks like possible blood.  He is hypotensive.  Rectal temperature of 94.  Concern for possible decompensated cirrhosis and liver failure versus sepsis versus GI bleed.  Will initiate broad workup, fluids low threshold for starting Levophed .  Patient's hemoglobin came back in the sevens.  Secondary to hypotension, vomiting blood and likely dilution with all the fluids been getting when ahead and order 2 unit transfusion.  Will plan for ICU admission.  Pending completion of workup.  CRITICAL CARE Performed by: Selinda Sias Total critical care time: 35 minutes Critical care time was exclusive of separately billable procedures and treating other patients. Critical care was necessary to treat or prevent imminent or life-threatening deterioration. Critical care was time spent personally by me on the following activities: development of treatment plan with patient and/or surrogate as well as nursing, discussions with consultants, evaluation of patient's response to treatment, examination of patient, obtaining history from patient or surrogate, ordering and performing treatments and interventions, ordering and review of laboratory studies, ordering and review of radiographic studies, pulse oximetry and re-evaluation of patient's condition.       Sias Selinda, MD 07/03/24 [phone]

## 2024-07-03 NOTE — Plan of Care (Signed)
  Problem: Education: Goal: Knowledge of General Education information will improve Description: Including pain rating scale, medication(s)/side effects and non-pharmacologic comfort measures Outcome: Progressing   Problem: Health Behavior/Discharge Planning: Goal: Ability to manage health-related needs will improve Outcome: Progressing   Problem: Clinical Measurements: Goal: Ability to maintain clinical measurements within normal limits will improve Outcome: Progressing Goal: Will remain free from infection Outcome: Progressing Goal: Diagnostic test results will improve Outcome: Progressing Goal: Respiratory complications will improve Outcome: Progressing Goal: Cardiovascular complication will be avoided Outcome: Progressing   Problem: Activity: Goal: Risk for activity intolerance will decrease Outcome: Progressing   Problem: Nutrition: Goal: Adequate nutrition will be maintained Outcome: Progressing   Problem: Coping: Goal: Level of anxiety will decrease Outcome: Progressing   Problem: Elimination: Goal: Will not experience complications related to bowel motility Outcome: Progressing Goal: Will not experience complications related to urinary retention Outcome: Progressing   Problem: Pain Managment: Goal: General experience of comfort will improve and/or be controlled Outcome: Progressing   Problem: Safety: Goal: Ability to remain free from injury will improve Outcome: Progressing   Problem: Skin Integrity: Goal: Risk for impaired skin integrity will decrease Outcome: Progressing   Problem: Coping: Goal: Ability to adjust to condition or change in health will improve Outcome: Progressing   Problem: Fluid Volume: Goal: Ability to maintain a balanced intake and output will improve Outcome: Progressing   Problem: Health Behavior/Discharge Planning: Goal: Ability to identify and utilize available resources and services will improve Outcome: Progressing Goal:  Ability to manage health-related needs will improve Outcome: Progressing   Problem: Metabolic: Goal: Ability to maintain appropriate glucose levels will improve Outcome: Progressing   Problem: Nutritional: Goal: Maintenance of adequate nutrition will improve Outcome: Progressing Goal: Progress toward achieving an optimal weight will improve Outcome: Progressing   Problem: Skin Integrity: Goal: Risk for impaired skin integrity will decrease Outcome: Progressing   Problem: Tissue Perfusion: Goal: Adequacy of tissue perfusion will improve Outcome: Progressing

## 2024-07-03 NOTE — ED Triage Notes (Signed)
 Pt BIB [facility] from home alone. Pt reports falling and laying in the floor for the last 24 hrs. On EMS arrival pt was laying in the floor, GCS 13, with beer, cigarettes and mariajuana around him. Pt was also incontinent of urine and feces. Pt reports being out of his home meds so he has not been compliant with them; and also reports not eating and drinking for a few days.   93% on 3L [place]; does not normally require oxygen. BP 70/55 CBG 88

## 2024-07-03 NOTE — ED Provider Notes (Signed)
 [place] EMERGENCY DEPARTMENT AT [facility] Provider Note   CSN: [id number] Arrival date & time: 07/03/24  0205     Patient presents with: [name] is a 53 y.o. male.   The history is provided by the patient and medical records.  Fall   53 y.o. M with hx of alcohol  abuse, schizoaffective disorder, gout, GERD, cirrhosis, PTSD, substance abuse, esophageal varices, anemia, presenting to the ED after unwitnessed fall at home.  Apparently patient had a fall from bed anywhere from 12-24 hours ago, has been in the floor since that time.  He was found lying in the floor beside bed in a mix of urine, feces, beer, and cigarette butts.  Patient is quite jaundiced in appearance on arrival.  Hypotensive in the 60's upon arrival.  IVF started with EMS, only received 300cc thus far.  Prior to Admission medications   Medication Sig Start Date End Date Taking? Authorizing Provider  allopurinol  (ZYLOPRIM ) 100 MG tablet Take 1 tablet (100 mg total) by mouth daily. 01/15/24   Elgergawy, Brayton RAMAN, MD  folic acid  (FOLVITE ) 1 MG tablet Take 1 tablet (1 mg total) by mouth daily. 03/11/24   Hongalgi, Anand D, MD  gabapentin  (NEURONTIN ) 300 MG capsule Take 1 capsule (300 mg total) by mouth 2 (two) times daily. 01/15/24   Elgergawy, Brayton RAMAN, MD  Lactulose  20 GM/30ML SOLN Take 15 mLs (10 g total) by mouth 3 (three) times daily. 06/30/24   Tanda Bleacher, MD  levETIRAcetam  (KEPPRA ) 500 MG tablet Take 1 tablet (500 mg total) by mouth 2 (two) times daily. 03/10/24 05/09/24  Hongalgi, Anand D, MD  Multiple Vitamin (MULTIVITAMIN WITH MINERALS) TABS tablet Take 1 tablet by mouth daily. 03/11/24   Hongalgi, Anand D, MD  pantoprazole  (PROTONIX ) 40 MG tablet Take 1 tablet (40 mg total) by mouth daily. 06/21/24   Tanda Bleacher, MD  QUEtiapine  (SEROQUEL ) 200 MG tablet Take 1 tablet (200 mg total) by mouth at bedtime. 06/21/24 06/21/25  Tanda Bleacher, MD  sertraline  (ZOLOFT ) 25 MG tablet Take 1 tablet  (25 mg total) by mouth daily. 03/12/23 03/11/24  Von Bellis, MD  thiamine  (VITAMIN B-1) 100 MG tablet Take 1 tablet (100 mg total) by mouth daily. 03/11/24   Hongalgi, Anand D, MD    Allergies: Bupropion    Review of Systems  Constitutional:        Fall  All other systems reviewed and are negative.   Updated Vital Signs BP (!) 64/37   Pulse 78   Resp (!) 27   Ht 6' (1.829 m)   Wt 97.5 kg   SpO2 97%   BMI 29.15 kg/m   Physical Exam Vitals and nursing note reviewed.  Constitutional:      Appearance: He is well-developed. He is ill-appearing.     Comments: Jaundiced appearing, cool to the touch  HENT:     Head: Normocephalic and atraumatic.  Eyes:     General: Scleral icterus present.     Conjunctiva/sclera: Conjunctivae normal.     Pupils: Pupils are equal, round, and reactive to light.  Neck:     Comments: C-collar in place Cardiovascular:     Rate and Rhythm: Normal rate and regular rhythm.     Heart sounds: Normal heart sounds.  Pulmonary:     Effort: Pulmonary effort is normal.     Breath sounds: Normal breath sounds.  Abdominal:     General: Bowel sounds are normal.     Palpations:  Abdomen is soft.  Musculoskeletal:        General: Normal range of motion.     Comments: Cigarette butts on patient's back and buttocks Has old appearing bruising to the lower extremities, appears in various stages of healing  Skin:    General: Skin is warm and dry.  Neurological:     Mental Status: He is alert.     Comments: Awake, alert, a little slow to respond but responses are mostly appropriate, able to states name and year, thinks we are in [place] , seems globally weak without focal deficit     (all labs ordered are listed, but only abnormal results are displayed) Labs Reviewed  CBC WITH DIFFERENTIAL/PLATELET - Abnormal; Notable for the following components:      Result Value   WBC 14.1 (*)    RBC 2.20 (*)    Hemoglobin 7.6 (*)    HCT 21.2 (*)    MCH 34.5 (*)     RDW 18.5 (*)    Platelets 130 (*)    nRBC 0.4 (*)    Neutro Abs 12.1 (*)    Abs Immature Granulocytes 0.21 (*)    All other components within normal limits  PROTIME-INR - Abnormal; Notable for the following components:   Prothrombin Time 24.3 (*)    INR 2.1 (*)    All other components within normal limits  I-STAT CG4 LACTIC ACID, ED - Abnormal; Notable for the following components:   Lactic Acid, Venous >15.0 (*)    All other components within normal limits  CULTURE, BLOOD (ROUTINE X 2)  CULTURE, BLOOD (ROUTINE X 2)  COMPREHENSIVE METABOLIC PANEL WITH GFR  AMMONIA  ETHANOL  RAPID URINE DRUG SCREEN, HOSP PERFORMED  URINALYSIS, W/ REFLEX TO CULTURE (INFECTION SUSPECTED)  CK  MAGNESIUM   VITAMIN B12  FOLATE  IRON AND TIBC  FERRITIN  RETICULOCYTES  TYPE AND SCREEN  PREPARE RBC (CROSSMATCH)  TROPONIN I (HIGH SENSITIVITY)    EKG: EKG Interpretation Date/Time:  Sunday July 03 2024 02:14:17 EST Ventricular Rate:  76 PR Interval:  140 QRS Duration:  93 QT Interval:  439 QTC Calculation: 494 R Axis:   82  Text Interpretation: Sinus rhythm Borderline prolonged QT interval Confirmed by Lorette Mayo [id number]) on 07/03/2024 2:47:03 AM  Radiology: CT Cervical Spine Wo Contrast Result Date: 07/03/2024 EXAM: CT CERVICAL SPINE WITHOUT CONTRAST 07/03/2024 03:59:58 AM TECHNIQUE: CT of the cervical spine was performed without the administration of intravenous contrast. Multiplanar reformatted images are provided for review. Automated exposure control, iterative reconstruction, and/or weight based adjustment of the mA/kV was utilized to reduce the radiation dose to as low as reasonably achievable. COMPARISON: CT of the cervical spine dated 08/26/2021. CLINICAL HISTORY: Neck trauma, dangerous injury mechanism (Age 58-64y). FINDINGS: CERVICAL SPINE: BONES AND ALIGNMENT: No acute fracture or traumatic malalignment. There is mild reversal of the normal cervical lordosis. DEGENERATIVE CHANGES:  There is an anterior bridging osteophyte present at C4-C5. There are also prominent osteophytes at C5-C6. SOFT TISSUES: No prevertebral soft tissue swelling. IMPRESSION: 1. No acute abnormality of the cervical spine 2. Mild reversal of the normal cervical lordosis 3. Anterior bridging osteophyte at C4-5 and prominent osteophytes at C5-6 Electronically signed by: Evalene Coho MD 07/03/2024 04:17 AM EST RP Workstation: [id number]   CT Head Wo Contrast Result Date: 07/03/2024 EXAM: CT HEAD WITHOUT CONTRAST 07/03/2024 03:59:58 AM TECHNIQUE: CT of the head was performed without the administration of intravenous contrast. Automated exposure control, iterative reconstruction, and/or weight based adjustment of the mA/kV  was utilized to reduce the radiation dose to as low as reasonably achievable. COMPARISON: 03/06/2024 CLINICAL HISTORY: Facial trauma, blunt. FINDINGS: BRAIN AND VENTRICLES: No acute hemorrhage. No evidence of acute infarct. Proportional prominence of ventricles and sulci, consistent with diffuse cerebral parenchymal volume loss. No hydrocephalus. No extra-axial collection. No mass effect or midline shift. ORBITS: No acute abnormality. SINUSES: No acute abnormality. SOFT TISSUES AND SKULL: No acute soft tissue abnormality. No skull fracture. IMPRESSION: 1. No acute intracranial abnormality related to facial trauma. 2. Proportional prominence of ventricles and sulci, consistent with diffuse cerebral parenchymal volume loss. Electronically signed by: Evalene Coho MD 07/03/2024 04:15 AM EST RP Workstation: [id number]   DG Pelvis Portable Result Date: 07/03/2024 EXAM: 1 or 2 VIEW(S) XRAY OF THE PELVIS 07/03/2024 02:57:33 AM COMPARISON: CT abdomen and pelvis with contrast 08/26/2021. CLINICAL HISTORY: fall at home, in floor for 12 hours FINDINGS: BONES AND JOINTS: No acute fracture. A bone island is noted chronically in the lower right ilium. No joint dislocation. SOFT TISSUES: There is dilatation  of the central small bowel up to 3.7 cm, which could be due to ileus or obstruction. Further evaluation recommended. Again noted are dystrophic soft tissue calcifications alongside the right greater trochanter, on CT were within the gluteus medius/minimus insertional fibers. . Soft tissues are otherwise unremarkable. IMPRESSION: 1. Dilatation of the central small bowel up to 3.7 cm, possibly due to ileus or obstruction. Further evaluation is recommended. 2. No AP evidence of  acute fracture or dislocation. Electronically signed by: Francis Quam MD 07/03/2024 03:12 AM EST RP Workstation: [id number]   DG Chest Port 1 View Result Date: 07/03/2024 EXAM: 1 VIEW(S) XRAY OF THE CHEST 07/03/2024 02:57:33 AM COMPARISON: Portable chest 03/08/2024. CLINICAL HISTORY: Fall at home, in floor for 12 hours. FINDINGS: LINES, TUBES AND DEVICES: There are overlying monitor leads. LUNGS AND PLEURA: No focal pulmonary opacity. No pleural effusion. No pneumothorax. HEART AND MEDIASTINUM: No acute abnormality of the cardiac and mediastinal silhouettes. BONES AND SOFT TISSUES: No acute osseous abnormality. IMPRESSION: 1. No acute cardiopulmonary process identified. Stable chest. Electronically signed by: Francis Quam MD 07/03/2024 03:06 AM EST RP Workstation: [id number]     Procedures   CRITICAL CARE Performed by: Olam CHRISTELLA Slocumb   Total critical care time: 60 minutes  Critical care time was exclusive of separately billable procedures and treating other patients.  Critical care was necessary to treat or prevent imminent or life-threatening deterioration.  Critical care was time spent personally by me on the following activities: development of treatment plan with patient and/or surrogate as well as nursing, discussions with consultants, evaluation of patient's response to treatment, examination of patient, obtaining history from patient or surrogate, ordering and performing treatments and interventions, ordering and  review of laboratory studies, ordering and review of radiographic studies, pulse oximetry and re-evaluation of patient's condition.   Medications Ordered in the ED  octreotide (SANDOSTATIN) 2 mcg/mL load via infusion 50 mcg (50 mcg Intravenous Bolus from Bag 07/03/24 0320)    And  octreotide (SANDOSTATIN) 500 mcg in sodium chloride  0.9 % 250 mL (2 mcg/mL) infusion (50 mcg/hr Intravenous New Bag/Given 07/03/24 0319)  lactated ringers  infusion (has no administration in time range)  ceFEPIme  (MAXIPIME ) 2 g in sodium chloride  0.9 % 100 mL IVPB (2 g Intravenous New Bag/Given 07/03/24 0330)  metroNIDAZOLE  (FLAGYL ) IVPB 500 mg (has no administration in time range)  vancomycin  (VANCOREADY) IVPB 2000 mg/400 mL (has no administration in time range)  0.9 %  sodium chloride  infusion (Manually  program via Guardrails IV Fluids) (has no administration in time range)  sodium chloride  0.9 % bolus 1,000 mL (1,000 mLs Intravenous New Bag/Given 07/03/24 0307)  lactated ringers  bolus 1,000 mL (1,000 mLs Intravenous New Bag/Given 07/03/24 0308)  pantoprazole  (PROTONIX ) injection 40 mg (40 mg Intravenous Given 07/03/24 0316)                                    Medical Decision Making Amount and/or Complexity of Data Reviewed Labs: ordered. Radiology: ordered and independent interpretation performed. ECG/medicine tests: ordered and independent interpretation performed.  Risk OTC drugs. Prescription drug management. Decision regarding hospitalization.   53 year old male presenting to the ED after a fall.  Lives alone and apparently had fallen from bed, has been on the floor for the last 24 hours.  Was found covered in feces, urine, beer, and cigarettes.  Patient is very ill-appearing, hypotensive on arrival in the 60's systolic.  He is cool to the touch but remains awake and coherent, able to answer questions.  He appears severely jaundiced with scleral icterus.  Does have some old appearing bruising to the  legs but no acute deformities noted.  Has some dried blood in the mouth.  Does have known cirrhosis and esophageal varices due to alcohol  abuse.   Consider sepsis, GI, decompensated liver failure, etc.  Very broad work-up started including IVF, protonix , Rocephin, and octreotide.  Will monitor pressures closely.  Hyothermic to 94 rectally, placed on bair hugger.  Lactate > 15.  Initiate sepsis protocol with broad IV abx.  Blood cultures sennt.  BP up to 90's currently after about 2L.  He remains AAO.  Hemoglobin 7.3, significantly lower than prior.  Agreeable to transfusion.  Anemia panel sent.  CXR without acute findings.  INR 2.1.  ethanol negative. CT head/neck pending.    Discussed with PCCM, Dr. Kara-- has evaluated in the ED, will admit to ICU.  Pressures still soft with low MAP.  Recommended to start some temporary low dose levophed  which has been ordered.  Patient also requested nicotine  patch which has also been ordered.  CMP has resulted-- has new renal failure with SrCr of 4.83.  Significant electrolyte derangements--sodium 126, potassium 3.1, chloride less than 65.  CO2 17.  LFTs 384, 134.  Alk phos 171.  Bilirubin 15.5.  CT Head/neck without acute findings.  C-collar removed.  BP uptrending with Levophed .    Blood transfusion delayed--- per blood bank had possible antibodies on first screening.  They are re-running this now for double verification.  Re-assessed multiple times throughout ED course.  Pressures soft but continues mentating appropriately for the most part.  Stable for transport to ICU.  Final diagnoses:  Fall, initial encounter  Acute renal failure, unspecified acute renal failure type  Jaundice  Alcoholic cirrhosis, unspecified whether ascites present [facility])  Acute upper GI bleeding  Hypochloremia  Hypokalemia    ED Discharge Orders     None          Jarold Olam HERO, PA-C 07/03/24 [id number]    Lorette Mayo, MD 07/03/24 [phone]

## 2024-07-03 NOTE — Progress Notes (Signed)
 eLink Physician-Brief Progress Note Patient Name: [name] DOB: [birth year] MRN: [id number]   Date of Service  07/03/2024  HPI/Events of Note  K3.6  eICU Interventions  Kcl   0544 -bladder scan with 70 cc, no urine output, receiving scheduled albumin  and PIV norepinephrine .  No intervention about oliguria for now.  Additional KCl.  Intervention Category Minor Interventions: Electrolytes abnormality - evaluation and management  Amaury Kuzel 07/03/2024, 9:14 PM

## 2024-07-04 ENCOUNTER — Inpatient Hospital Stay (HOSPITAL_COMMUNITY)

## 2024-07-04 ENCOUNTER — Encounter (HOSPITAL_COMMUNITY): Admission: EM | Disposition: E | Payer: Self-pay | Source: Home / Self Care | Attending: Critical Care Medicine

## 2024-07-04 ENCOUNTER — Encounter (HOSPITAL_COMMUNITY): Payer: Self-pay | Admitting: Pulmonary Disease

## 2024-07-04 DIAGNOSIS — K766 Portal hypertension: Secondary | ICD-10-CM | POA: Diagnosis not present

## 2024-07-04 DIAGNOSIS — K269 Duodenal ulcer, unspecified as acute or chronic, without hemorrhage or perforation: Secondary | ICD-10-CM

## 2024-07-04 DIAGNOSIS — G9341 Metabolic encephalopathy: Secondary | ICD-10-CM | POA: Diagnosis not present

## 2024-07-04 DIAGNOSIS — K7290 Hepatic failure, unspecified without coma: Secondary | ICD-10-CM

## 2024-07-04 DIAGNOSIS — I8500 Esophageal varices without bleeding: Secondary | ICD-10-CM | POA: Diagnosis not present

## 2024-07-04 DIAGNOSIS — D62 Acute posthemorrhagic anemia: Secondary | ICD-10-CM

## 2024-07-04 DIAGNOSIS — K87 Disorders of gallbladder, biliary tract and pancreas in diseases classified elsewhere: Secondary | ICD-10-CM

## 2024-07-04 DIAGNOSIS — R579 Shock, unspecified: Secondary | ICD-10-CM | POA: Diagnosis not present

## 2024-07-04 DIAGNOSIS — F1020 Alcohol dependence, uncomplicated: Secondary | ICD-10-CM | POA: Diagnosis not present

## 2024-07-04 DIAGNOSIS — R7401 Elevation of levels of liver transaminase levels: Secondary | ICD-10-CM

## 2024-07-04 DIAGNOSIS — K3189 Other diseases of stomach and duodenum: Secondary | ICD-10-CM

## 2024-07-04 DIAGNOSIS — N179 Acute kidney failure, unspecified: Secondary | ICD-10-CM

## 2024-07-04 DIAGNOSIS — K920 Hematemesis: Secondary | ICD-10-CM

## 2024-07-04 DIAGNOSIS — K209 Esophagitis, unspecified without bleeding: Secondary | ICD-10-CM | POA: Diagnosis not present

## 2024-07-04 DIAGNOSIS — K7682 Hepatic encephalopathy: Secondary | ICD-10-CM

## 2024-07-04 DIAGNOSIS — K2289 Other specified disease of esophagus: Secondary | ICD-10-CM | POA: Diagnosis not present

## 2024-07-04 DIAGNOSIS — K2980 Duodenitis without bleeding: Secondary | ICD-10-CM

## 2024-07-04 DIAGNOSIS — K7030 Alcoholic cirrhosis of liver without ascites: Secondary | ICD-10-CM | POA: Diagnosis not present

## 2024-07-04 HISTORY — PX: ESOPHAGOGASTRODUODENOSCOPY: SHX5428

## 2024-07-04 LAB — CBC
HCT: 24.3 % — ABNORMAL LOW (ref 39.0–52.0)
HCT: 23.7 % — ABNORMAL LOW (ref 39.0–52.0)
Hemoglobin: 9.1 g/dL — ABNORMAL LOW (ref 13.0–17.0)
Hemoglobin: 8.7 g/dL — ABNORMAL LOW (ref 13.0–17.0)
MCH: 34.1 pg — ABNORMAL HIGH (ref 26.0–34.0)
MCH: 33.7 pg (ref 26.0–34.0)
MCHC: 37.4 g/dL — ABNORMAL HIGH (ref 30.0–36.0)
MCHC: 36.7 g/dL — ABNORMAL HIGH (ref 30.0–36.0)
MCV: 91 fL (ref 80.0–100.0)
MCV: 91.9 fL (ref 80.0–100.0)
Platelets: 60 K/uL — ABNORMAL LOW (ref 150–400)
Platelets: 63 K/uL — ABNORMAL LOW (ref 150–400)
RBC: 2.67 MIL/uL — ABNORMAL LOW (ref 4.22–5.81)
RBC: 2.58 MIL/uL — ABNORMAL LOW (ref 4.22–5.81)
RDW: 17.3 % — ABNORMAL HIGH (ref 11.5–15.5)
RDW: 17.3 % — ABNORMAL HIGH (ref 11.5–15.5)
WBC: 15.6 K/uL — ABNORMAL HIGH (ref 4.0–10.5)
WBC: 13.3 K/uL — ABNORMAL HIGH (ref 4.0–10.5)
nRBC: 0.5 % — ABNORMAL HIGH (ref 0.0–0.2)
nRBC: 0.8 % — ABNORMAL HIGH (ref 0.0–0.2)

## 2024-07-04 LAB — VANCOMYCIN, RANDOM: Vancomycin Rm: 28 ug/mL

## 2024-07-04 LAB — BASIC METABOLIC PANEL WITH GFR
Anion gap: 21 — ABNORMAL HIGH (ref 5–15)
Anion gap: 19 — ABNORMAL HIGH (ref 5–15)
Anion gap: 19 — ABNORMAL HIGH (ref 5–15)
BUN: 90 mg/dL — ABNORMAL HIGH (ref 6–20)
BUN: 93 mg/dL — ABNORMAL HIGH (ref 6–20)
BUN: 95 mg/dL — ABNORMAL HIGH (ref 6–20)
CO2: 25 mmol/L (ref 22–32)
CO2: 25 mmol/L (ref 22–32)
CO2: 25 mmol/L (ref 22–32)
Calcium: 7.8 mg/dL — ABNORMAL LOW (ref 8.9–10.3)
Calcium: 7.7 mg/dL — ABNORMAL LOW (ref 8.9–10.3)
Calcium: 7.7 mg/dL — ABNORMAL LOW (ref 8.9–10.3)
Chloride: 77 mmol/L — ABNORMAL LOW (ref 98–111)
Chloride: 79 mmol/L — ABNORMAL LOW (ref 98–111)
Chloride: 78 mmol/L — ABNORMAL LOW (ref 98–111)
Creatinine, Ser: 4.4 mg/dL — ABNORMAL HIGH (ref 0.61–1.24)
Creatinine, Ser: 4.25 mg/dL — ABNORMAL HIGH (ref 0.61–1.24)
Creatinine, Ser: 4.4 mg/dL — ABNORMAL HIGH (ref 0.61–1.24)
GFR, Estimated: 15 mL/min — ABNORMAL LOW (ref >60)
GFR, Estimated: 16 mL/min — ABNORMAL LOW (ref >60)
GFR, Estimated: 15 mL/min — ABNORMAL LOW (ref >60)
Glucose, Bld: 149 mg/dL — ABNORMAL HIGH (ref 70–99)
Glucose, Bld: 110 mg/dL — ABNORMAL HIGH (ref 70–99)
Glucose, Bld: 115 mg/dL — ABNORMAL HIGH (ref 70–99)
Potassium: 3.4 mmol/L — ABNORMAL LOW (ref 3.5–5.1)
Potassium: 3.2 mmol/L — ABNORMAL LOW (ref 3.5–5.1)
Potassium: 3.8 mmol/L (ref 3.5–5.1)
Sodium: 123 mmol/L — ABNORMAL LOW (ref 135–145)
Sodium: 123 mmol/L — ABNORMAL LOW (ref 135–145)
Sodium: 122 mmol/L — ABNORMAL LOW (ref 135–145)

## 2024-07-04 LAB — GLUCOSE, CAPILLARY
Glucose-Capillary: 141 mg/dL — ABNORMAL HIGH (ref 70–99)
Glucose-Capillary: 134 mg/dL — ABNORMAL HIGH (ref 70–99)
Glucose-Capillary: 129 mg/dL — ABNORMAL HIGH (ref 70–99)
Glucose-Capillary: 134 mg/dL — ABNORMAL HIGH (ref 70–99)
Glucose-Capillary: 123 mg/dL — ABNORMAL HIGH (ref 70–99)
Glucose-Capillary: 134 mg/dL — ABNORMAL HIGH (ref 70–99)
Glucose-Capillary: 139 mg/dL — ABNORMAL HIGH (ref 70–99)

## 2024-07-04 LAB — AMMONIA: Ammonia: 88 umol/L — ABNORMAL HIGH (ref 9–35)

## 2024-07-04 LAB — POCT I-STAT 7, (LYTES, BLD GAS, ICA,H+H)
Acid-Base Excess: 7 mmol/L — ABNORMAL HIGH (ref 0.0–2.0)
Bicarbonate: 32.5 mmol/L — ABNORMAL HIGH (ref 20.0–28.0)
Calcium, Ion: 0.95 mmol/L — ABNORMAL LOW (ref 1.15–1.40)
HCT: 27 % — ABNORMAL LOW (ref 39.0–52.0)
Hemoglobin: 9.2 g/dL — ABNORMAL LOW (ref 13.0–17.0)
O2 Saturation: 100 %
Patient temperature: 96
Potassium: 3.2 mmol/L — ABNORMAL LOW (ref 3.5–5.1)
Sodium: 121 mmol/L — ABNORMAL LOW (ref 135–145)
TCO2: 34 mmol/L — ABNORMAL HIGH (ref 22–32)
pCO2 arterial: 48.1 mmHg — ABNORMAL HIGH (ref 32–48)
pH, Arterial: 7.431 (ref 7.35–7.45)
pO2, Arterial: 343 mmHg — ABNORMAL HIGH (ref 83–108)

## 2024-07-04 LAB — LACTIC ACID, PLASMA
Lactic Acid, Venous: 2.5 mmol/L (ref 0.5–1.9)
Lactic Acid, Venous: 2.1 mmol/L (ref 0.5–1.9)
Lactic Acid, Venous: 2.6 mmol/L (ref 0.5–1.9)

## 2024-07-04 LAB — LEVETIRACETAM LEVEL: Levetiracetam Lvl: <2 ug/mL — ABNORMAL LOW (ref 10.0–40.0)

## 2024-07-04 LAB — MAGNESIUM: Magnesium: 2.4 mg/dL (ref 1.7–2.4)

## 2024-07-04 SURGERY — EGD (ESOPHAGOGASTRODUODENOSCOPY)
Anesthesia: Moderate Sedation

## 2024-07-04 MED ORDER — HEPARIN SODIUM (PORCINE) 1000 UNIT/ML IJ SOLN
INTRAMUSCULAR | Status: AC
  Administered 2024-07-04: 2400 [IU] via INTRAVENOUS_CENTRAL
  Filled 2024-07-04: qty 4

## 2024-07-04 MED ORDER — THIAMINE HCL 100 MG/ML IJ SOLN
500.0000 mg | Freq: Every day | INTRAVENOUS | Status: AC
Start: 2024-07-04 — End: 2024-07-08
  Administered 2024-07-04 – 2024-07-08 (×5): 500 mg via INTRAVENOUS
  Filled 2024-07-04 (×5): qty 5

## 2024-07-04 MED ORDER — FENTANYL CITRATE (PF) 50 MCG/ML IJ SOSY
PREFILLED_SYRINGE | INTRAMUSCULAR | Status: AC
  Filled 2024-07-04: qty 2

## 2024-07-04 MED ORDER — HEPARIN SODIUM (PORCINE) 1000 UNIT/ML DIALYSIS: 1000.0000 [IU] | INTRAMUSCULAR | Status: DC | PRN

## 2024-07-04 MED ORDER — NOREPINEPHRINE 4 MG/250ML-% IV SOLN
0.0000 ug/min | INTRAVENOUS | Status: DC
Start: 2024-07-04 — End: 2024-07-04
  Administered 2024-07-04: 17 ug/min via INTRAVENOUS
  Filled 2024-07-04: qty 250

## 2024-07-04 MED ORDER — PROPOFOL 1000 MG/100ML IV EMUL
0.0000 ug/kg/min | INTRAVENOUS | Status: DC
Start: 2024-07-04 — End: 2024-07-12
  Administered 2024-07-04: 20 ug/kg/min via INTRAVENOUS
  Administered 2024-07-04 – 2024-07-05 (×2): 10 ug/kg/min via INTRAVENOUS
  Administered 2024-07-05: 30 ug/kg/min via INTRAVENOUS
  Administered 2024-07-05: 10 ug/kg/min via INTRAVENOUS
  Administered 2024-07-06: 30 ug/kg/min via INTRAVENOUS
  Administered 2024-07-07: 15 ug/kg/min via INTRAVENOUS
  Administered 2024-07-07 (×2): 20 ug/kg/min via INTRAVENOUS
  Administered 2024-07-08 (×2): 30 ug/kg/min via INTRAVENOUS
  Administered 2024-07-08: 20 ug/kg/min via INTRAVENOUS
  Administered 2024-07-08: 30 ug/kg/min via INTRAVENOUS
  Administered 2024-07-09: 20 ug/kg/min via INTRAVENOUS
  Administered 2024-07-09 (×2): 30 ug/kg/min via INTRAVENOUS
  Administered 2024-07-09 – 2024-07-12 (×9): 20 ug/kg/min via INTRAVENOUS
  Filled 2024-07-04 (×27): qty 100

## 2024-07-04 MED ORDER — POTASSIUM CHLORIDE 10 MEQ/50ML IV SOLN
10.0000 meq | INTRAVENOUS | Status: AC
  Administered 2024-07-04 (×3): 10 meq via INTRAVENOUS
  Filled 2024-07-04 (×3): qty 50

## 2024-07-04 MED ORDER — GERHARDT'S BUTT CREAM
TOPICAL_CREAM | Freq: Two times a day (BID) | CUTANEOUS | Status: DC
  Administered 2024-07-05 – 2024-07-10 (×2): 1 via TOPICAL
  Filled 2024-07-04 (×2): qty 60

## 2024-07-04 MED ORDER — ROCURONIUM BROMIDE 10 MG/ML (PF) SYRINGE: 100.0000 mg | PREFILLED_SYRINGE | Freq: Once | INTRAVENOUS | Status: AC

## 2024-07-04 MED ORDER — EPINEPHRINE 1 MG/10ML IV SOSY
PREFILLED_SYRINGE | INTRAVENOUS | Status: AC
  Filled 2024-07-04: qty 10

## 2024-07-04 MED ORDER — VASOPRESSIN 20 UNITS/100 ML INFUSION FOR SHOCK
0.0400 [IU]/min | INTRAVENOUS | Status: DC
  Administered 2024-07-04 – 2024-07-07 (×7): 0.03 [IU]/min via INTRAVENOUS
  Administered 2024-07-08 – 2024-07-12 (×14): 0.04 [IU]/min via INTRAVENOUS
  Filled 2024-07-04 (×20): qty 100

## 2024-07-04 MED ORDER — LACTULOSE 10 GM/15ML PO SOLN
30.0000 g | Freq: Three times a day (TID) | ORAL | Status: DC
  Administered 2024-07-04: 30 g via ORAL
  Filled 2024-07-04 (×2): qty 45

## 2024-07-04 MED ORDER — FUROSEMIDE 10 MG/ML IJ SOLN
40.0000 mg | Freq: Once | INTRAMUSCULAR | Status: DC
  Filled 2024-07-04: qty 4

## 2024-07-04 MED ORDER — ORAL CARE MOUTH RINSE: 15.0000 mL | OROMUCOSAL | Status: DC | PRN

## 2024-07-04 MED ORDER — LACTULOSE 10 GM/15ML PO SOLN
30.0000 g | Freq: Three times a day (TID) | ORAL | Status: DC
  Administered 2024-07-05 – 2024-07-06 (×4): 30 g
  Filled 2024-07-04 (×4): qty 45

## 2024-07-04 MED ORDER — HYDROCORTISONE SOD SUC (PF) 100 MG IJ SOLR
100.0000 mg | Freq: Three times a day (TID) | INTRAMUSCULAR | Status: DC
  Administered 2024-07-04 – 2024-07-06 (×5): 100 mg via INTRAVENOUS
  Filled 2024-07-04 (×5): qty 2

## 2024-07-04 MED ORDER — VASOPRESSIN 20 UNITS/100 ML INFUSION FOR SHOCK
INTRAVENOUS | Status: AC
  Filled 2024-07-04: qty 100

## 2024-07-04 MED ORDER — FENTANYL BOLUS VIA INFUSION
20.0000 ug | INTRAVENOUS | Status: DC | PRN
  Administered 2024-07-04 – 2024-07-05 (×2): 20 ug via INTRAVENOUS

## 2024-07-04 MED ORDER — ETOMIDATE 2 MG/ML IV SOLN: 20.0000 mg | Freq: Once | INTRAVENOUS | Status: AC

## 2024-07-04 MED ORDER — MIDAZOLAM HCL 2 MG/2ML IJ SOLN
INTRAMUSCULAR | Status: AC
  Filled 2024-07-04: qty 2

## 2024-07-04 MED ORDER — POTASSIUM CHLORIDE 10 MEQ/50ML IV SOLN: 10.0000 meq | INTRAVENOUS | Status: DC

## 2024-07-04 MED ORDER — HEPARIN SODIUM (PORCINE) 5000 UNIT/ML IJ SOLN
INTRAMUSCULAR | Status: AC
  Filled 2024-07-04: qty 4

## 2024-07-04 MED ORDER — PHENYLEPHRINE 80 MCG/ML (10ML) SYRINGE FOR IV PUSH (FOR BLOOD PRESSURE SUPPORT)
PREFILLED_SYRINGE | INTRAVENOUS | Status: AC
  Filled 2024-07-04: qty 10

## 2024-07-04 MED ORDER — ORAL CARE MOUTH RINSE
15.0000 mL | OROMUCOSAL | Status: DC
  Administered 2024-07-04 – 2024-07-12 (×91): 15 mL via OROMUCOSAL

## 2024-07-04 MED ORDER — ROCURONIUM BROMIDE 10 MG/ML (PF) SYRINGE
PREFILLED_SYRINGE | INTRAVENOUS | Status: AC
  Administered 2024-07-04: 100 mg via INTRAVENOUS
  Filled 2024-07-04: qty 10

## 2024-07-04 MED ORDER — FENTANYL 2500MCG IN NS 250ML (10MCG/ML) PREMIX INFUSION
0.0000 ug/h | INTRAVENOUS | Status: DC
  Administered 2024-07-04: 25 ug/h via INTRAVENOUS
  Filled 2024-07-04: qty 250

## 2024-07-04 MED ORDER — NOREPINEPHRINE 16 MG/250ML-% IV SOLN
0.0000 ug/min | INTRAVENOUS | Status: AC
Start: 2024-07-04 — End: ?
  Administered 2024-07-04: 25 ug/min via INTRAVENOUS
  Administered 2024-07-05: 20 ug/min via INTRAVENOUS
  Administered 2024-07-05: 29 ug/min via INTRAVENOUS
  Administered 2024-07-06: 13 ug/min via INTRAVENOUS
  Administered 2024-07-07: 37 ug/min via INTRAVENOUS
  Administered 2024-07-07: 15 ug/min via INTRAVENOUS
  Administered 2024-07-07: 40 ug/min via INTRAVENOUS
  Administered 2024-07-08: 38 ug/min via INTRAVENOUS
  Administered 2024-07-08 – 2024-07-09 (×5): 40 ug/min via INTRAVENOUS
  Administered 2024-07-09: 35 ug/min via INTRAVENOUS
  Administered 2024-07-10 (×2): 39 ug/min via INTRAVENOUS
  Administered 2024-07-10: 40 ug/min via INTRAVENOUS
  Administered 2024-07-11: 39 ug/min via INTRAVENOUS
  Administered 2024-07-11 – 2024-07-12 (×5): 40 ug/min via INTRAVENOUS
  Filled 2024-07-04 (×23): qty 250

## 2024-07-04 MED ORDER — ETOMIDATE 2 MG/ML IV SOLN
INTRAVENOUS | Status: AC
  Administered 2024-07-04: 20 mg via INTRAVENOUS
  Filled 2024-07-04: qty 20

## 2024-07-04 MED ORDER — FUROSEMIDE 10 MG/ML IJ SOLN
120.0000 mg | Freq: Once | INTRAVENOUS | Status: AC
  Administered 2024-07-04: 120 mg via INTRAVENOUS
  Filled 2024-07-04: qty 120
  Filled 2024-07-04: qty 12

## 2024-07-04 MED ORDER — POTASSIUM CHLORIDE 10 MEQ/100ML IV SOLN
10.0000 meq | INTRAVENOUS | Status: AC
Start: 2024-07-04 — End: 2024-07-04
  Administered 2024-07-04 (×2): 10 meq via INTRAVENOUS
  Filled 2024-07-04 (×3): qty 100

## 2024-07-04 NOTE — Op Note (Signed)
 [facility] Patient Name: [name] Procedure Date : 07/04/2024 MRN: [id number] Attending MD: Sandor Flatter , MD, [id number] Date of Birth: 25-Dec-1970 CSN: [id number] Age: 53 Admit Type: Inpatient Procedure:                Upper GI endoscopy w/ biopsy and endoscopic OG tube                            placement Indications:              Acute post hemorrhagic anemia, Coffee-ground emesis                           The patient was electively intubated prior to the                            procedure. Providers:                Sandor Flatter, MD, Clotilda Schmitz, RN, Jasmine                            [place], Technician Referring MD:              Medicines:                Monitored Anesthesia Care Complications:            Minor bleeding from biopsy sites, controlled with                            Purastat gel injection. Estimated Blood Loss:     Estimated blood loss was minimal. Procedure:                Pre-Anesthesia Assessment:                           - Prior to the procedure, a History and Physical                            was performed, and patient medications and                            allergies were reviewed. The patient's tolerance of                            previous anesthesia was also reviewed. The risks                            and benefits of the procedure and the sedation                            options and risks were discussed with the patient'                            sister by phone. All questions were answered, and  informed consent was obtained. Prior                            Anticoagulants: The patient has taken no                            anticoagulant or antiplatelet agents. ASA Grade                            Assessment: IV - A patient with severe systemic                            disease that is a constant threat to life. After                            reviewing the risks and benefits,  the patient was                            deemed in satisfactory condition to undergo the                            procedure.                           After obtaining informed consent, the endoscope was                            passed under direct vision. Throughout the                            procedure, the patient's blood pressure, pulse, and                            oxygen saturations were monitored continuously. The                            [id number]-[id number] ([id number]) Olympus endoscope was introduced                            through the mouth, and advanced to the second part                            of duodenum. The upper GI endoscopy was                            accomplished without difficulty. The patient                            tolerated the procedure well. Scope In: Scope Out: Findings:      Severe esophagitis with no bleeding was found in the mid and lower third       of the esophagus.      Moderate portal hypertensive gastropathy was found in the entire       examined stomach. Biopsies were taken with a cold forceps  for histology.       Estimated blood loss was minimal.      Diffuse severe inflammation characterized by congestion (edema),       erythema and shallow ulcerations was found in the duodenal bulb, in the       first portion of the duodenum and in the second portion of the duodenum.       Biopsies were taken with a cold forceps for histology. Estimated blood       loss was minimal. Impression:               - Severe esophagitis in the mid and lower esophagus                            characterized by black tissue bordering on acute                            esophageal necrosis. There was a clear transition                            at the GE junction, more consistent with acute                            esophageal necrosis. Since he has a history of                            esophageal varices and unable to determine whether                             or not there are varices present on this exam due                            to the severity of esophagitis, I elected against                            esophageal biopsies today.                           - Portal hypertensive gastropathy. Biopsied.                           - Moderate, ulcerated duodenitis. Biopsied.                           - There was mild, but persistent oozing at each of                            the biopsy sites due to thrombocytopenia with                            dysfunctional platelets and severe systemic                            illness. This was controlled using a total of 6 cc  of purastat gel with complete cessation of                            bleeding. I observed each of the biopsy sites for                            several minutes afterwards and no recurrence of                            bleeding noted.                           - At the conclusion of the procedure an OG tube was                            placed endoscopically and visualized in the gastric                            body. This was taped in place at the end of the                            procedure. Recommendation:           To remain intubated, sedated in the ICU.                           Continue broad-spectrum antibiotics.                           Continue serial CBC checks with additional blood                            products as needed per protocol.                           Do not use OG tube for the next 6 hours due to the                            use of pure stat gel in the stomach and wanting to                            avoid medically induced clot disruption. After 6                            hours, ok to use OG tube for medications, feeds,                            and suction as needed.                           Will follow-up on pathology results.                           Will check serum gastrin level.  Continue octreotide.                           Continue high-dose IV PPI.                           Start liquid Carafate when tolerating p.o.                           Results discussed with ICU service.                           Results discussed with patient's sister by phone. Procedure Code(s):        --- Professional ---                           [phone], Esophagogastroduodenoscopy, flexible,                            transoral; with biopsy, single or multiple Diagnosis Code(s):        --- Professional ---                           K20.90, Esophagitis, unspecified without bleeding                           K76.6, Portal hypertension                           K31.89, Other diseases of stomach and duodenum                           K29.80, Duodenitis without bleeding                           D62, Acute posthemorrhagic anemia                           K92.0, Hematemesis CPT copyright 2022 [employer]. All rights reserved. The codes documented in this report are preliminary and upon coder review may  be revised to meet current compliance requirements. Sandor Flatter, MD 07/04/2024 2:07:56 PM Number of Addenda: 0

## 2024-07-04 NOTE — Progress Notes (Signed)
 NAME:  [name], MRN:  [id number], DOB:  [birth year], LOS: 1 ADMISSION DATE:  07/03/2024, CONSULTATION DATE:  07/03/24 REFERRING MD:  EDP CHIEF COMPLAINT:  Shock   History of Present Illness:  [name] is a 53 year old male, daily smoker with history of alcohol  dependence, withdrawal seizures, liver cirrhosis, PTSD, and schizoaffective disorder who is brought in by EMS after falling and being found on the floor covered in urine and feces.   In the ER, vitals showed temp of 94.78F, BP 74/39, RR 25, Spo2 94% on 4L O2.   Labs show Na 126, K 3.1, Cl <65, Serum bicarb 17, Glucose 61, Cr 4.83 (baseline 0.79), Alk phos 171, albumin  2.1, AST/ALT 384/134, Ammonia 77, T. Bili 15.5, CK 285, Trop 25. CBC shows WBC 14, Hgb 7.6g/dL, plts 869. Lactic acid >15.  He reports throwing up dark blood over the past 2 days. He has epigastric discomfort. He is alert to person and time but not place.   CT Head without acute abnormality. CT C-spine without acute abnormality. Chest radiograph unremarkable.  PCCM called for admission.   Pertinent  Medical History   Past Medical History:  Diagnosis Date   Alcohol  abuse    Anxiety    at age 58   Blood transfusion without reported diagnosis    Cirrhosis (HCC)    Depression    at age 49   Epileptic seizure (HCC)    Not associated with EtOH withdrawal   GERD (gastroesophageal reflux disease)    Hypertension    Nausea and vomiting    Psychosis (HCC)    PTSD (post-traumatic stress disorder)    Schizoaffective disorder    Seizure disorder (HCC)    related to etoh seizure   Symptomatic anemia    Significant Hospital Events: Including procedures, antibiotic start and stop dates in addition to other pertinent events   11/16 admitted to ICU for shock.  Interim History / Subjective:  Worsening encephalopathy, Hb stable 9.2, on vasopressors levo 17 Not making urine has been anuric for couple of hours Intubated for EGD Bedside egd 11/17-severe  esophagitis in mid-lower esophagus with acute esophageal necrosis. OG placed by GI via EGD and taped, okay to use after 6hrs Not use OG for atleast 6 hours Will restart lactulose  20 3 times daily    Objective    Blood pressure (!) 93/49, pulse 82, temperature 97.8 F (36.6 C), temperature source Oral, resp. rate (!) 32, height 6' (1.829 m), weight 93.8 kg, SpO2 93%.        Intake/Output Summary (Last 24 hours) at 07/04/2024 1049 Last data filed at 07/04/2024 1000 Gross per 24 hour  Intake 3630.46 ml  Output 440 ml  Net 3190.46 ml   Filed Weights   07/03/24 0220 07/04/24 0400  Weight: 97.5 kg 93.8 kg    Examination: General: Intubated, chronically ill-appearing, not in distress HEENT: NCAT, dry mucous membrane Lungs: Clear to auscultation chest symmetrical movement CVS: S1-S2 normal, no ABDOMEN: Soft nontender nondistended Extremities: Warm, no edema Neuro: Intubated on mechanical ventilation and sedated. GU: Deferred   Resolved problem list   Assessment and Plan  53 year old male with a history of alcohol  use, withdrawal seizures, liver cirrhosis, presents with altered mental status, and hematemesis. Patient is being managed in the ICU for septic &hemorrhagic shock, and upper GI bleed.  Acute metabolic encephalopathy-multifactorial History of liver cirrhosis and hyperammonemia Alcoholic cirrhosis, with transaminitis and hyperbilirubinemia- - Child Pugh Score 11, Class C,  - MELD-Na score 39  High risk for alcohol  withdrawal-on phenobarb Septic shock-on vasopressors Upper GI bleed AKI on CKD-anuria  Plan - ICU for monitoring and management - Wean off sedation and mechanical ventilation as tolerated - Wean off vasopressors as tolerated to maintain maps> 65 - S/p EGD on 11/19, OG placed by GI and taped.  Acute use after 6 hours - Lactulose  20 3 times daily for hyperammonemia - Serial H&H - Monitor and replace electrolyte as needed - Continue phenobarb for high  risk alcohol  withdrawal - Continue high-dose thiamine  - Continue antibiotics Zosyn - Nephrology team consulted for renal replacement therapy - Continue strict I&O's, Foley   Labs   CBC: Recent Labs  Lab 07/03/24 0257 07/03/24 0448 07/03/24 0640 07/03/24 1946 07/04/24 0420  WBC 14.1* 13.0*  --  14.3* 15.6*  NEUTROABS 12.1*  --   --   --   --   HGB 7.6* 8.7* 9.9* 9.8* 9.1*  HCT 21.2* 24.7* 29.0* 27.5* 24.3*  MCV 96.4 97.2  --  94.8 91.0  PLT 130* 102*  --  PLATELET CLUMPS NOTED ON SMEAR, UNABLE TO ESTIMATE 60*    Basic Metabolic Panel: Recent Labs  Lab 07/03/24 0257 07/03/24 0448 07/03/24 0640 07/03/24 1946 07/04/24 0420  NA 126*  --  118* 122* 123*  K 3.1*  --  3.2* 3.6 3.4*  CL <65*  --   --  74* 77*  CO2 17*  --   --  17* 25  GLUCOSE 61*  --   --  194* 149*  BUN 79*  --   --  84* 90*  CREATININE 4.83*  --   --  4.41* 4.40*  CALCIUM  8.1*  --   --  7.8* 7.8*  MG 1.7 1.5*  --   --  2.4  PHOS  --  7.5*  --   --   --    GFR: Estimated Creatinine Clearance: 23.6 mL/min (A) (by C-G formula based on SCr of 4.4 mg/dL (H)). Recent Labs  Lab 07/03/24 0257 07/03/24 0304 07/03/24 0448 07/03/24 0510 07/03/24 1238 07/03/24 1434 07/03/24 1946 07/04/24 0420 07/04/24 0744  WBC 14.1*  --  13.0*  --   --   --  14.3* 15.6*  --   LATICACIDVEN  --    < >  --  >15.0* >9.0* >9.0*  --   --  2.5*   < > = values in this interval not displayed.    Liver Function Tests: Recent Labs  Lab 07/03/24 0257 07/03/24 1946  AST 384* 464*  ALT 134* 167*  ALKPHOS 171* 152*  BILITOT 15.5* 19.2*  PROT 4.9* 4.8*  ALBUMIN  2.1* 2.1*   No results for input(s): LIPASE, AMYLASE in the last 168 hours. Recent Labs  Lab 07/03/24 0257 07/04/24 0420  AMMONIA 77* 88*    ABG    Component Value Date/Time   PHART 7.450 07/03/2024 0640   PCO2ART 26.9 (L) 07/03/2024 0640   PO2ART 104 07/03/2024 0640   HCO3 18.8 (L) 07/03/2024 0640   TCO2 20 (L) 07/03/2024 0640   ACIDBASEDEF 4.0 (H)  07/03/2024 0640   O2SAT 99 07/03/2024 0640     Coagulation Profile: Recent Labs  Lab 07/03/24 0257  INR 2.1*    Cardiac Enzymes: Recent Labs  Lab 07/03/24 0257  CKTOTAL 285    HbA1C: Hgb A1c MFr Bld  Date/Time Value Ref Range Status  07/03/2024 12:38 PM 5.1 4.8 - 5.6 % Final    Comment:    (NOTE) Diagnosis of Diabetes The  following HbA1c ranges recommended by the American Diabetes Association (ADA) may be used as an aid in the diagnosis of diabetes mellitus.  Hemoglobin             Suggested A1C NGSP%              Diagnosis  <5.7                   Non Diabetic  5.7-6.4                Pre-Diabetic  >6.4                   Diabetic  <7.0                   Glycemic control for                       adults with diabetes.    09/07/2021 03:01 PM 5.5 4.8 - 5.6 % Final    Comment:    (NOTE)         Prediabetes: 5.7 - 6.4         Diabetes: >6.4         Glycemic control for adults with diabetes: <7.0     CBG: Recent Labs  Lab 07/03/24 1543 07/03/24 1918 07/03/24 2308 07/04/24 0344 07/04/24 0746  GLUCAP 252* 202* 190* 141* 134*    Review of Systems:   Review of Systems  Constitutional:  Positive for malaise/fatigue. Negative for chills, fever and weight loss.  HENT:  Negative for congestion, sinus pain and sore throat.   Eyes: Negative.   Respiratory:  Negative for cough, hemoptysis, sputum production, shortness of breath and wheezing.   Cardiovascular:  Negative for chest pain, palpitations, orthopnea, claudication and leg swelling.  Gastrointestinal:  Positive for blood in stool, nausea and vomiting. Negative for abdominal pain and heartburn.  Genitourinary: Negative.   Musculoskeletal:  Negative for joint pain and myalgias.  Skin:  Negative for rash.  Neurological:  Positive for weakness.  Endo/Heme/Allergies: Negative.   Psychiatric/Behavioral:  Positive for depression.    Past Medical History:  He,  has a past medical history of Alcohol  abuse,  Anxiety, Blood transfusion without reported diagnosis, Cirrhosis (HCC), Depression, Epileptic seizure (HCC), GERD (gastroesophageal reflux disease), Hypertension, Nausea and vomiting, Psychosis (HCC), PTSD (post-traumatic stress disorder), Schizoaffective disorder, Seizure disorder (HCC), and Symptomatic anemia.   Surgical History:   Past Surgical History:  Procedure Laterality Date   ESOPHAGOGASTRODUODENOSCOPY (EGD) WITH PROPOFOL  N/A 05/05/2020   Procedure: ESOPHAGOGASTRODUODENOSCOPY (EGD) WITH PROPOFOL ;  Surgeon: Shila Gustav GAILS, MD;  Location: [facility];  Service: Endoscopy;  Laterality: N/A;   FOOT SURGERY     right   HAND SURGERY       Social History:   reports that he has been smoking cigarettes. He has a 45 pack-year smoking history. He has never used smokeless tobacco. He reports current alcohol  use. He reports current drug use. Frequency: 1.00 time per week. Drug: Marijuana.   Family History:  His family history includes Alcoholism in his mother; CAD in his father; Depression in his brother. There is no history of Colon polyps, Esophageal cancer, Pancreatic cancer, or Stomach cancer.   Allergies Allergies  Allergen Reactions   Bupropion Other (See Comments)    Caused seizures, Feel as though I will have a seizure when taking this med     Home Medications  Prior to Admission medications   Medication  Sig Start Date End Date Taking? Authorizing Provider  allopurinol  (ZYLOPRIM ) 100 MG tablet Take 1 tablet (100 mg total) by mouth daily. 01/15/24   Elgergawy, Brayton RAMAN, MD  folic acid  (FOLVITE ) 1 MG tablet Take 1 tablet (1 mg total) by mouth daily. 03/11/24   Hongalgi, Anand D, MD  gabapentin  (NEURONTIN ) 300 MG capsule Take 1 capsule (300 mg total) by mouth 2 (two) times daily. 01/15/24   Elgergawy, Brayton RAMAN, MD  Lactulose  20 GM/30ML SOLN Take 15 mLs (10 g total) by mouth 3 (three) times daily. 06/30/24   Tanda Bleacher, MD  levETIRAcetam  (KEPPRA ) 500 MG tablet Take 1 tablet  (500 mg total) by mouth 2 (two) times daily. 03/10/24 05/09/24  Hongalgi, Anand D, MD  Multiple Vitamin (MULTIVITAMIN WITH MINERALS) TABS tablet Take 1 tablet by mouth daily. 03/11/24   Hongalgi, Anand D, MD  pantoprazole  (PROTONIX ) 40 MG tablet Take 1 tablet (40 mg total) by mouth daily. 06/21/24   Tanda Bleacher, MD  QUEtiapine  (SEROQUEL ) 200 MG tablet Take 1 tablet (200 mg total) by mouth at bedtime. 06/21/24 06/21/25  Tanda Bleacher, MD  sertraline  (ZOLOFT ) 25 MG tablet Take 1 tablet (25 mg total) by mouth daily. 03/12/23 03/11/24  Von Bellis, MD  thiamine  (VITAMIN B-1) 100 MG tablet Take 1 tablet (100 mg total) by mouth daily. 03/11/24   Judeth Trenda BIRCH, MD     Critical care time: 45 minutes     Lenny Drought, MD  [facility] Prefer epic messenger for cross cover needs    Critical care time was exclusive of separately billable procedures and treating other patients.  Critical care was necessary to treat or prevent imminent or life-threatening deterioration.  Critical care was time spent personally by me on the following activities: development of treatment plan with patient and/or surrogate as well as nursing, discussions with consultants, evaluation of patient's response to treatment, examination of patient, obtaining history from patient or surrogate, ordering and performing treatments and interventions, ordering and review of laboratory studies, ordering and review of radiographic studies, pulse oximetry, re-evaluation of patient's condition and participation in multidisciplinary rounds.

## 2024-07-04 NOTE — Consult Note (Signed)
 WOC Nurse Consult Note: Reason for Consult: B great toe wounds  Wound type: Full thickness dorsal aspect of B great toes likely r/t trauma such as shoe rubbing; both areas very similar in appearance and size black eschar; scattered areas of scabbing L 2nd digit and R 3rd digit as well  Pressure Injury POA: NA not in area of pressure  Measurement: see nursing flowsheet  Wound bed: eschar as above  Drainage (amount, consistency, odor) see nursing flowsheet  Periwound:intact  Dressing procedure/placement/frequency: Paint B great toe and adjacent toe wounds with Betadine 2 times daily and allow to dry. Can leave open to air or apply dry gauze and tape whichever is preferred.   POC discussed with bedside nurse. WOC team will not follow. Re-consult if further needs arise.   Thank you,    Powell Bar MSN, RN-BC, [employer]

## 2024-07-04 NOTE — Procedures (Signed)
 Central Venous Catheter Insertion Procedure Note  [name]  [id number]  Jun 19, 1971  Date:07/04/24  Time:12:29 PM   Provider Performing:Tyisha Cressy   Procedure: Insertion of Non-tunneled Central Venous [phone]) with US  guidance (23062)   Indication(s) Medication administration  Consent Risks of the procedure as well as the alternatives and risks of each were explained to the patient and/or caregiver.  Consent for the procedure was obtained and is signed in the bedside chart  Anesthesia Topical only with 1% lidocaine    Timeout Verified patient identification, verified procedure, site/side was marked, verified correct patient position, special equipment/implants available, medications/allergies/relevant history reviewed, required imaging and test results available.  Sterile Technique Maximal sterile technique including full sterile barrier drape, hand hygiene, sterile gown, sterile gloves, mask, hair covering, sterile ultrasound probe cover (if used).  Procedure Description Area of catheter insertion was cleaned with chlorhexidine  and draped in sterile fashion.  With real-time ultrasound guidance a HD catheter was placed into the right internal jugular vein. Nonpulsatile blood flow and easy flushing noted in all ports.  The catheter was sutured in place and sterile dressing applied.  Complications/Tolerance None; patient tolerated the procedure well. Chest X-ray is ordered to verify placement for internal jugular or subclavian cannulation.   Chest x-ray is not ordered for femoral cannulation.  EBL Less than   Specimen(s) None

## 2024-07-04 NOTE — Procedures (Signed)
 Arterial Line Insertion Start/End11/17/2025 5:15 PM, 07/04/2024 5:20 PM  Patient location: ICU. Preanesthetic checklist: patient identified, site marked, risks and benefits discussed, surgical consent, monitors and equipment checked and timeout performed Right, radial was placed Catheter size: 20 G Hand hygiene performed  and maximum sterile barriers used  Allen's test indicative of satisfactory collateral circulation Attempts: 1 Following insertion, Biopatch and dressing applied. Post procedure assessment: normal  Patient tolerated the procedure well with no immediate complications.

## 2024-07-04 NOTE — Procedures (Signed)
 Arterial Catheter Insertion Procedure Note  [name]  [id number]  1971/05/14  Date:07/04/24  Time:12:28 PM    Provider Performing: Lenny Drought    Procedure: Insertion of Arterial Line (63379) with US  guidance (23062)   Indication(s) Blood pressure monitoring and/or need for frequent ABGs  Consent Risks of the procedure as well as the alternatives and risks of each were explained to the patient and/or caregiver.  Consent for the procedure was obtained and is signed in the bedside chart  Anesthesia None   Time Out Verified patient identification, verified procedure, site/side was marked, verified correct patient position, special equipment/implants available, medications/allergies/relevant history reviewed, required imaging and test results available.   Sterile Technique Maximal sterile technique including full sterile barrier drape, hand hygiene, sterile gown, sterile gloves, mask, hair covering, sterile ultrasound probe cover (if used).   Procedure Description Area of catheter insertion was cleaned with chlorhexidine  and draped in sterile fashion. With real-time ultrasound guidance an arterial catheter was placed into the right radial artery.  Appropriate arterial tracings confirmed on monitor.     Complications/Tolerance None; patient tolerated the procedure well.   EBL Less than 5 cc   Specimen(s) None

## 2024-07-04 NOTE — Progress Notes (Deleted)
 Pt transported from ED Tr C to [facility] without complications.

## 2024-07-04 NOTE — Procedures (Signed)
 Intubation Procedure Note  [name]  [id number]  [birth year]  Date:07/04/24  Time:12:29 PM   Provider Performing:Kaidence Callaway    Procedure: Intubation (31500)  Indication(s) Respiratory Failure  Consent Risks of the procedure as well as the alternatives and risks of each were explained to the patient and/or caregiver.  Consent for the procedure was obtained and is signed in the bedside chart   Anesthesia Etomidate  and Rocuronium    Time Out Verified patient identification, verified procedure, site/side was marked, verified correct patient position, special equipment/implants available, medications/allergies/relevant history reviewed, required imaging and test results available.   Sterile Technique Usual hand hygeine, masks, and gloves were used   Procedure Description Patient positioned in bed supine.  Sedation given as noted above.  Patient was intubated with endotracheal tube using Glidescope.  View was Grade 1 full glottis .  Number of attempts was 1.  Colorimetric CO2 detector was consistent with tracheal placement.   Complications/Tolerance None; patient tolerated the procedure well. Chest X-ray is ordered to verify placement.   EBL none   Specimen(s) None

## 2024-07-04 NOTE — Consult Note (Addendum)
 Nephrology Consult   Requesting provider: Lenny Drought Service requesting consult: CCM Reason for consult: AKI   Assessment/Recommendations: [name] is a/an 53 y.o. male with a past medical history of alcohol  dependence, history of withdrawal seizures, liver cirrhosis complicated by varices, schizoaffective disorder who present w/ shock complicated by AKI  Nonoliguric AKI: Baseline creatinine.  Creatinine 4.8 on arrival and has risen slightly.  Did have some urine output when Foley was placed.  Bladder scan was difficult given distended abdomen.  Likely has some HRS type physiology as well as ATN at this point -Agree with norepinephrine , albumin , octreotide -Continue to monitor urine output closely -Consider CRRT based on progress -Recommend advancing goals of care conversations  -Continue to monitor daily Cr, Dose meds for GFR -Monitor Daily I/Os, Daily weight  -Maintain MAP>65 for optimal renal perfusion.  -Avoid nephrotoxic medications including NSAIDs -Use synthetic opioids (Fentanyl /Dilaudid ) if needed - Lasix  120 milligrams for stress test  Severe shock: Likely septic component compounded on cirrhosis.  Significant lactic acidosis related to shock and liver failure.  Continue with supportive care with norepinephrine .  Antibiotics per primary team as well  GI bleed: On octreotide.  History of varices.  GI to follow  Altered mental status: Likely multifactorial.  Uremia may be playing some role but likely hepatic encephalopathy.  Continue with lactulose   Hyponatremia: Unlikely the cause of his altered mental status but present likely associated with cirrhosis and AKI.  Continue supportive care.  Continue to monitor 2-3 times daily   Alcoholic cirrhosis: Management per primary team and GI   Recommendations conveyed to primary service.    [facility] 07/04/2024 10:34  AM   _____________________________________________________________________________________ CC: Found down  History of Present Illness: [name] is a/an 53 y.o. male with a past medical history of alcohol  dependence, history of withdrawal seizures, liver cirrhosis complicated by varices, schizoaffective disorder who presents after a fall and being found down.SABRA  History is limited because the patient is not able to converse.  History was obtained per chart review.  Patient had an unwitnessed fall from home.  Was down for about 12 to 24 hours but unclear on the details.  He was found on the floor beside his bed in a mix of urine, feces, beer.  Patient was hypotensive on arrival.  Temperature was fairly low as well.  Labs consistent with hyponatremia as well as AKI with creatinine 4.8.  He did converse in the emergency department letting doctors know he had thrown up the last couple times over the past few days.  He was started on pressors and given some IV fluids.  He was also started on treatment for GI bleed.  Foley catheter was placed and did return with some urine output.  Medications:  Current Facility-Administered Medications  Medication Dose Route Frequency Provider Last Rate Last Admin   0.9 %  sodium chloride  infusion (Manually program via Guardrails IV Fluids)   Intravenous Once Mesner, Selinda, MD       albumin  human 5 % solution 25 g  25 g Intravenous Q6H Drought Lenny, MD 60 mL/hr at 07/04/24 1000 Infusion Verify at 07/04/24 1000   Chlorhexidine  Gluconate Cloth 2 % PADS 6 each  6 each Topical Daily Dewald, Jonathan B, MD   6 each at 07/04/24 0900   docusate sodium  (COLACE) capsule 100 mg  100 mg Oral BID PRN Kara Dorn NOVAK, MD       furosemide  (LASIX ) 120 mg in dextrose  5 % 50 mL  IVPB  120 mg Intravenous Once Vanesha Athens J, MD       insulin  aspart (novoLOG ) injection 0-9 Units  0-9 Units Subcutaneous TID WC Sharie Bourbon, MD   1 Units at 07/04/24 0751   lactulose   (CHRONULAC ) 10 GM/15ML solution 30 g  30 g Oral TID Sharie Bourbon, MD       LORazepam  (ATIVAN ) injection 1-2 mg  1-2 mg Intravenous Q1H PRN Sharie Bourbon, MD       norepinephrine  (LEVOPHED ) 4mg  in (0.016 mg/mL) premix infusion  0-10 mcg/min Intravenous Titrated Paliwal, Aditya, MD 37.5 mL/hr at 07/04/24 1000 10 mcg/min at 07/04/24 1000   octreotide (SANDOSTATIN) 500 mcg in sodium chloride  0.9 % 250 mL (2 mcg/mL) infusion  50 mcg/hr Intravenous Continuous Mesner, Selinda, MD 25 mL/hr at 07/04/24 1000 50 mcg/hr at 07/04/24 1000   pantoprazole  (PROTONIX ) injection 40 mg  40 mg Intravenous Q12H Kara Dorn NOVAK, MD   40 mg at 07/04/24 0900   PHENObarbital  (LUMINAL) injection 130 mg  130 mg Intravenous Daily Sharie Bourbon, MD   130 mg at 07/04/24 0909   piperacillin-tazobactam (ZOSYN) IVPB 3.375 g  3.375 g Intravenous Q8H Hussein, Abdullahi, MD   Stopped at 07/04/24 0925   polyethylene glycol (MIRALAX  / GLYCOLAX ) packet 17 g  17 g Oral Daily PRN Kara Dorn NOVAK, MD       thiamine  (VITAMIN B1) injection 100 mg  100 mg Intravenous Q24H Kara Dorn B, MD   100 mg at 07/04/24 0900   vancomycin  variable dose per unstable renal function (pharmacist dosing)   Does not apply See admin instructions Kara Dorn NOVAK, MD         ALLERGIES Bupropion  MEDICAL HISTORY Past Medical History:  Diagnosis Date   Alcohol  abuse    Anxiety    at age 28   Blood transfusion without reported diagnosis    Cirrhosis (HCC)    Depression    at age 27   Epileptic seizure (HCC)    Not associated with EtOH withdrawal   GERD (gastroesophageal reflux disease)    Hypertension    Nausea and vomiting    Psychosis (HCC)    PTSD (post-traumatic stress disorder)    Schizoaffective disorder    Seizure disorder (HCC)    related to etoh seizure   Symptomatic anemia      SOCIAL HISTORY Social History   Socioeconomic History   Marital status: Divorced    Spouse name: Not on file   Number of  children: Not on file   Years of education: Not on file   Highest education level: Not on file  Occupational History   Not on file  Tobacco Use   Smoking status: Every Day    Current packs/day: 1.50    Average packs/day: 1.5 packs/day for 30.0 years (45.0 ttl pk-yrs)    Types: Cigarettes   Smokeless tobacco: Never  Vaping Use   Vaping status: Never Used  Substance and Sexual Activity   Alcohol  use: Yes    Comment: until I pass out; last drink 01/03/24   Drug use: Yes    Frequency: 1.0 times per week    Types: Marijuana   Sexual activity: Yes    Birth control/protection: Condom  Other Topics Concern   Not on file  Social History Narrative   Not on file   Social Drivers of Health   Financial Resource Strain: High Risk (12/01/2023)   Overall Financial Resource Strain (CARDIA)    Difficulty of Paying Living  Expenses: Very hard  Food Insecurity: Food Insecurity Present (03/06/2024)   Hunger Vital Sign    Worried About Running Out of Food in the Last Year: Sometimes true    Ran Out of Food in the Last Year: Sometimes true  Transportation Needs: Unmet Transportation Needs (03/06/2024)   PRAPARE - Administrator, Civil Service (Medical): Yes    Lack of Transportation (Non-Medical): Yes  Physical Activity: Insufficiently Active (12/01/2023)   Exercise Vital Sign    Days of Exercise per Week: 3 days    Minutes of Exercise per Session: 30 min  Stress: Stress Concern Present (12/01/2023)   [employer] of Occupational Health - Occupational Stress Questionnaire    Feeling of Stress : To some extent  Social Connections: Moderately Integrated (12/01/2023)   Social Connection and Isolation Panel    Frequency of Communication with Friends and Family: Three times a week    Frequency of Social Gatherings with Friends and Family: Three times a week    Attends Religious Services: 1 to 4 times per year    Active Member of Clubs or Organizations: No    Attends Tax Inspector Meetings: Never    Marital Status: Living with partner  Intimate Partner Violence: Not At Risk (03/06/2024)   Humiliation, Afraid, Rape, and Kick questionnaire    Fear of Current or Ex-Partner: No    Emotionally Abused: No    Physically Abused: No    Sexually Abused: No     FAMILY HISTORY Family History  Problem Relation Age of Onset   Alcoholism Mother    CAD Father    Depression Brother    Colon polyps Neg Hx    Esophageal cancer Neg Hx    Pancreatic cancer Neg Hx    Stomach cancer Neg Hx       Review of Systems: Unable to obtain due to patient's altered mental status  Physical Exam: Vitals:   07/04/24 0930 07/04/24 0945  BP: (!) 94/50 (!) 93/49  Pulse: 81 82  Resp: (!) 34 (!) 32  Temp:    SpO2: 92% 93%   Total I/O In: 503.5 [I.V.:176.1; IV Piggyback:327.4] Out: 140 [Urine:140]  Intake/Output Summary (Last 24 hours) at 07/04/2024 1034 Last data filed at 07/04/2024 1000 Gross per 24 hour  Intake 3630.46 ml  Output 440 ml  Net 3190.46 ml   General: Critically ill-appearing, lying in bed, minimally interactive HEENT: icteric sclera, oropharynx clear without lesions CV: regular rate, murmur, 1+ pitting edema to bilateral lower extremities Lungs: Coarse breath sounds bilaterally, mild increased work of breathing  Abd: soft, non-tender, moderately distended Skin: Jaundice, mild erythema on the shins, otherwise no visible lesions or rashes Psych: Somnolent, minimally interactive to, unable to assess mood and affect Musculoskeletal: no obvious deformities Neuro: Globally encephalopathic, does answer some questions.  Can answer her name.  Not oriented to place or time  Test Results Reviewed Lab Results  Component Value Date   NA 123 (L) 07/04/2024   K 3.4 (L) 07/04/2024   CL 77 (L) 07/04/2024   CO2 25 07/04/2024   BUN 90 (H) 07/04/2024   CREATININE 4.40 (H) 07/04/2024   GFR 93.70 12/20/2020   CALCIUM  7.8 (L) 07/04/2024   ALBUMIN  2.1 (L)  07/03/2024   PHOS 7.5 (H) 07/03/2024    CBC Recent Labs  Lab 07/03/24 0257 07/03/24 0448 07/03/24 0640 07/03/24 1946 07/04/24 0420  WBC 14.1* 13.0*  --  14.3* 15.6*  NEUTROABS 12.1*  --   --   --   --  HGB 7.6* 8.7* 9.9* 9.8* 9.1*  HCT 21.2* 24.7* 29.0* 27.5* 24.3*  MCV 96.4 97.2  --  94.8 91.0  PLT 130* 102*  --  PLATELET CLUMPS NOTED ON SMEAR, UNABLE TO ESTIMATE 60*    I have reviewed all relevant outside healthcare records related to the patient's current hospitalization

## 2024-07-04 NOTE — Progress Notes (Signed)
 Consent obtained over the phone( witnessed by Lauraine Ryder) from [place] (Sister) [phone]  for central access for requiring higher vasopressors and potential need of renal replacement therapy. And intubation for EGD with GI.   Sister reports patient has a daughter(Cheyne) with whom has been estranged and not been in contact> 5 years.She does not have her contact infor.  Lenny Drought, MD  [facility] Prefer epic messenger for cross cover needs

## 2024-07-04 NOTE — Consult Note (Addendum)
 Consultation  Referring Provider: CCM/Hussein Primary Care Physician:  Tanda Bleacher, MD Primary Gastroenterologist:  Dr. Shila, 2022  Reason for Consultation: Hemorrhagic shock versus septic shock, anemia with significant drop in hemoglobin.  HPI: [name] is a 53 y.o. male with history of ongoing alcohol  abuse, previous diagnosis of cirrhosis, previously documented esophageal varices, PTSD, substance abuse and schizoaffective disorder who was brought to the emergency room after he was found down at home felt to have been on the floor between 12 and 24 hours and was found lying in stool and urine as well as beer and cigarettes.  Hypotensive with systolic blood pressure in the 60s. Patient admitted by CCM.  He is being covered with broad-spectrum antibiotics, blood cultures done and pending. He is requiring pressors. Initial hemoglobin 7.3 down from a hemoglobin of 13.1 in July 2025, also noted WBC 14.3/platelets clumped.  Initial sodium 118/potassium 3.2/chloride less than 65/glucose 61/BUN 79/creatinine 4.83 Albumin  2.1 T. bili 15.5/alk phos 171/AST 384/ALT 134 Lactate greater than 9 on admit Serum iron 135/TIBC 130/iron sat 104 ferritin 4000 Folate 4.3 EtOH level negative Ammonia 77 CK  2 85 Pro time 24.3/INR 2.1  CT head-proportional prominence of the ventricles and sulci consistent with diffuse cerebral parenchymal volume loss CT cervical spine no acute abnormality  Abdominal ultrasound-course cirrhotic appearing liver no evidence of cholelithiasis or cholecystitis common bile duct not visualized, no ascites commented on  MELD 3.0: 40 at 07/04/2024  4:20 AM MELD-Na: 39 at 07/04/2024  4:20 AM Calculated from: Serum Creatinine: 4.4 mg/dL (Using max of 3 mg/dL) at 88/82/7974  5:79 AM Serum Sodium: 123 mmol/L (Using min of 125 mmol/L) at 07/04/2024  4:20 AM Total Bilirubin: 19.2 mg/dL at 88/83/7974  2:53 PM Serum Albumin : 2.1 g/dL at 88/83/7974  2:53 PM INR(ratio):  2.1 at 07/03/2024  2:57 AM Age at listing (hypothetical): 53 years Sex: Male at 07/04/2024  4:20 AM   Patient was alert but confused on admission, unable to say whether he had been aware of any bleeding at home. Unclear whether he was taking any regular medications at home. Has not had any active bleeding overnight, no emesis or melena  Pressors have required some upward titration Mental status has declined overnight, he is currently moaning but unable to follow any commands or answer any questions, likely worsened encephalopathy  Last EGD had been done in September 2021 with finding of grade 1 varices and mild portal hypertensive gastropathy Colonoscopy July 2022 nonbleeding internal and external hemorrhoids medium sized, 2 sessile polyps were removed, diminutive 2 to 3 mm in size.    Past Medical History:  Diagnosis Date   Alcohol  abuse    Anxiety    at age 35   Blood transfusion without reported diagnosis    Cirrhosis (HCC)    Depression    at age 25   Epileptic seizure (HCC)    Not associated with EtOH withdrawal   GERD (gastroesophageal reflux disease)    Hypertension    Nausea and vomiting    Psychosis (HCC)    PTSD (post-traumatic stress disorder)    Schizoaffective disorder    Seizure disorder (HCC)    related to etoh seizure   Symptomatic anemia     Past Surgical History:  Procedure Laterality Date   ESOPHAGOGASTRODUODENOSCOPY (EGD) WITH PROPOFOL  N/A 05/05/2020   Procedure: ESOPHAGOGASTRODUODENOSCOPY (EGD) WITH PROPOFOL ;  Surgeon: Shila Gustav GAILS, MD;  Location: [facility];  Service: Endoscopy;  Laterality: N/A;   FOOT SURGERY     right  HAND SURGERY      Prior to Admission medications   Medication Sig Start Date End Date Taking? Authorizing Provider  allopurinol  (ZYLOPRIM ) 100 MG tablet Take 1 tablet (100 mg total) by mouth daily. Patient not taking: Reported on 07/03/2024 01/15/24   Elgergawy, Brayton RAMAN, MD  gabapentin  (NEURONTIN ) 300 MG capsule Take  1 capsule (300 mg total) by mouth 2 (two) times daily. Patient not taking: Reported on 07/03/2024 01/15/24   Elgergawy, Brayton RAMAN, MD  Lactulose  20 GM/30ML SOLN Take 15 mLs (10 g total) by mouth 3 (three) times daily. Patient not taking: Reported on 07/03/2024 06/30/24   Tanda Bleacher, MD  levETIRAcetam  (KEPPRA ) 500 MG tablet Take 1 tablet (500 mg total) by mouth 2 (two) times daily. Patient not taking: Reported on 07/03/2024 03/10/24 05/09/24  Hongalgi, Anand D, MD  Multiple Vitamin (MULTIVITAMIN WITH MINERALS) TABS tablet Take 1 tablet by mouth daily. Patient not taking: Reported on 07/03/2024 03/11/24   Judeth Trenda BIRCH, MD  pantoprazole  (PROTONIX ) 40 MG tablet Take 1 tablet (40 mg total) by mouth daily. Patient not taking: Reported on 07/03/2024 06/21/24   Tanda Bleacher, MD  QUEtiapine  (SEROQUEL ) 200 MG tablet Take 1 tablet (200 mg total) by mouth at bedtime. Patient not taking: Reported on 07/03/2024 06/21/24 06/21/25  Tanda Bleacher, MD  sertraline  (ZOLOFT ) 25 MG tablet Take 1 tablet (25 mg total) by mouth daily. Patient not taking: Reported on 07/03/2024 03/12/23 03/11/24  Von Bellis, MD    Current Facility-Administered Medications  Medication Dose Route Frequency Provider Last Rate Last Admin   0.9 %  sodium chloride  infusion (Manually program via Guardrails IV Fluids)   Intravenous Once Mesner, Selinda, MD       albumin  human 5 % solution 25 g  25 g Intravenous Q6H Sharie Bourbon, MD 60 mL/hr at 07/04/24 1000 Infusion Verify at 07/04/24 1000   Chlorhexidine  Gluconate Cloth 2 % PADS 6 each  6 each Topical Daily Kara Dorn NOVAK, MD   6 each at 07/04/24 0900   docusate sodium  (COLACE) capsule 100 mg  100 mg Oral BID PRN Kara Dorn NOVAK, MD       etomidate  (AMIDATE ) 2 MG/ML injection            fentaNYL  (SUBLIMAZE ) 50 MCG/ML injection            furosemide  (LASIX ) 120 mg in dextrose  5 % 50 mL IVPB  120 mg Intravenous Once Peeples, Samuel J, MD       insulin  aspart (novoLOG )  injection 0-9 Units  0-9 Units Subcutaneous TID WC Sharie Bourbon, MD   1 Units at 07/04/24 0751   lactulose  (CHRONULAC ) 10 GM/15ML solution 30 g  30 g Oral TID Sharie Bourbon, MD       LORazepam  (ATIVAN ) injection 1-2 mg  1-2 mg Intravenous Q1H PRN Sharie Bourbon, MD       midazolam  (VERSED ) 2 MG/2ML injection            norepinephrine  (LEVOPHED ) 4mg  in (0.016 mg/mL) premix infusion  0-10 mcg/min Intravenous Titrated Paliwal, Aditya, MD 37.5 mL/hr at 07/04/24 1000 10 mcg/min at 07/04/24 1000   octreotide (SANDOSTATIN) 500 mcg in sodium chloride  0.9 % 250 mL (2 mcg/mL) infusion  50 mcg/hr Intravenous Continuous Mesner, Jason, MD 25 mL/hr at 07/04/24 1000 50 mcg/hr at 07/04/24 1000   pantoprazole  (PROTONIX ) injection 40 mg  40 mg Intravenous Q12H Kara Dorn B, MD   40 mg at 07/04/24 0900   PHENObarbital  (LUMINAL) injection 130 mg  130 mg Intravenous Daily Sharie Bourbon, MD   130 mg at 07/04/24 9090   phenylephrine  80 mcg/10 mL injection            piperacillin-tazobactam (ZOSYN) IVPB 3.375 g  3.375 g Intravenous Q8H Hussein, Abdullahi, MD   Stopped at 07/04/24 0925   polyethylene glycol (MIRALAX  / GLYCOLAX ) packet 17 g  17 g Oral Daily PRN Kara Dorn NOVAK, MD       rocuronium  (ZEMURON ) 100 MG/10ML injection            thiamine  (VITAMIN B1) injection 100 mg  100 mg Intravenous Q24H Kara Dorn B, MD   100 mg at 07/04/24 0900   vancomycin  variable dose per unstable renal function (pharmacist dosing)   Does not apply See admin instructions Kara Dorn NOVAK, MD        Allergies as of 07/03/2024 - Review Complete 07/03/2024  Allergen Reaction Noted   Bupropion Other (See Comments) 09/12/2014    Family History  Problem Relation Age of Onset   Alcoholism Mother    CAD Father    Depression Brother    Colon polyps Neg Hx    Esophageal cancer Neg Hx    Pancreatic cancer Neg Hx    Stomach cancer Neg Hx     Social History   Socioeconomic History   Marital  status: Divorced    Spouse name: Not on file   Number of children: Not on file   Years of education: Not on file   Highest education level: Not on file  Occupational History   Not on file  Tobacco Use   Smoking status: Every Day    Current packs/day: 1.50    Average packs/day: 1.5 packs/day for 30.0 years (45.0 ttl pk-yrs)    Types: Cigarettes   Smokeless tobacco: Never  Vaping Use   Vaping status: Never Used  Substance and Sexual Activity   Alcohol  use: Yes    Comment: until I pass out; last drink 01/03/24   Drug use: Yes    Frequency: 1.0 times per week    Types: Marijuana   Sexual activity: Yes    Birth control/protection: Condom  Other Topics Concern   Not on file  Social History Narrative   Not on file   Social Drivers of Health   Financial Resource Strain: High Risk (12/01/2023)   Overall Financial Resource Strain (CARDIA)    Difficulty of Paying Living Expenses: Very hard  Food Insecurity: Food Insecurity Present (03/06/2024)   Hunger Vital Sign    Worried About Running Out of Food in the Last Year: Sometimes true    Ran Out of Food in the Last Year: Sometimes true  Transportation Needs: Unmet Transportation Needs (03/06/2024)   PRAPARE - Transportation    Lack of Transportation (Medical): Yes    Lack of Transportation (Non-Medical): Yes  Physical Activity: Insufficiently Active (12/01/2023)   Exercise Vital Sign    Days of Exercise per Week: 3 days    Minutes of Exercise per Session: 30 min  Stress: Stress Concern Present (12/01/2023)   [employer] of Occupational Health - Occupational Stress Questionnaire    Feeling of Stress : To some extent  Social Connections: Moderately Integrated (12/01/2023)   Social Connection and Isolation Panel    Frequency of Communication with Friends and Family: Three times a week    Frequency of Social Gatherings with Friends and Family: Three times a week    Attends Religious Services: 1 to 4 times per year  Active Member  of Clubs or Organizations: No    Attends Banker Meetings: Never    Marital Status: Living with partner  Intimate Partner Violence: Not At Risk (03/06/2024)   Humiliation, Afraid, Rape, and Kick questionnaire    Fear of Current or Ex-Partner: No    Emotionally Abused: No    Physically Abused: No    Sexually Abused: No    Review of Systems: Pt unable to ofer  Physical Exam: Vital signs in last 24 hours: Temp:  [97.5 F (36.4 C)-97.8 F (36.6 C)] 97.8 F (36.6 C) (11/17 0749) Pulse Rate:  [74-86] 82 (11/17 0945) Resp:  [13-37] 32 (11/17 0945) BP: (93-128)/(36-76) 93/49 (11/17 0945) SpO2:  [87 %-97 %] 93 % (11/17 0945) Weight:  [93.8 kg] 93.8 kg (11/17 0400) Last BM Date :  (PTA) General:   Alert, acute and chronically ill-appearing older white male , awake, moaning, unable to carry on any conversation or follow commands Head:  Normocephalic and atraumatic. Eyes:  Sclera icteric conjunctiva pink. Ears:  Normal auditory acuity. Nose:  No deformity, discharge,  or lesions. Mouth:  No deformity or lesions.  Old blood crusted in mouth Neck:  Supple; no masses or thyromegaly. Lungs:  Clear throughout to auscultation.   No wheezes, crackles, or rhonchi.  Heart:  Regular rate and rhythm; no murmurs, clicks, rubs,  or gallops. Abdomen:  Soft, nondistended, bowel sounds are present, no palpable mass or hepatosplenomegaly, no appreciable fluid wave.   Rectal: Not done Msk:  Symmetrical without gross deformities.  Scattered ecchymoses. Pulses:  Normal pulses noted. Extremities:  Without clubbing or edema.  Eschars on top of both great toes Neurologic: Awake, but unable to carry on any conversation or follow commands, moaning, Skin:  Intact without significant lesions or rashes.. Psych:   Intake/Output from previous day: 11/16 0701 - 11/17 0700 In: 4954.3 [P.O.:300; I.V.:2479.7; Blood:322; IV Piggyback:1852.6] Out: 300 [Urine:300] Intake/Output this shift: Total I/O In:  503.5 [I.V.:176.1; IV Piggyback:327.4] Out: 140 [Urine:140]  Lab Results: Recent Labs    07/03/24 0448 07/03/24 0640 07/03/24 1946 07/04/24 0420  WBC 13.0*  --  14.3* 15.6*  HGB 8.7* 9.9* 9.8* 9.1*  HCT 24.7* 29.0* 27.5* 24.3*  PLT 102*  --  PLATELET CLUMPS NOTED ON SMEAR, UNABLE TO ESTIMATE 60*   BMET Recent Labs    07/03/24 0257 07/03/24 0640 07/03/24 1946 07/04/24 0420  NA 126* 118* 122* 123*  K 3.1* 3.2* 3.6 3.4*  CL <65*  --  74* 77*  CO2 17*  --  17* 25  GLUCOSE 61*  --  194* 149*  BUN 79*  --  84* 90*  CREATININE 4.83*  --  4.41* 4.40*  CALCIUM  8.1*  --  7.8* 7.8*   LFT Recent Labs    07/03/24 1946  PROT 4.8*  ALBUMIN  2.1*  AST 464*  ALT 167*  ALKPHOS 152*  BILITOT 19.2*   PT/INR Recent Labs    07/03/24 0257  LABPROT 24.3*  INR 2.1*   Hepatitis Panel No results for input(s): HEPBSAG, HCVAB, HEPAIGM, HEPBIGM in the last 72 hours.    IMPRESSION:  #25 53 year old white male with longstanding history of chronic EtOH abuse, previously documented cirrhosis, with grade 1 varices on EGD as of 2021 as well as mild portal gastropathy.  Also with previous history of withdrawal seizures.  Patient was found down at home yesterday initially alert but confused, quite hypotensive with systolic pressure in the 60s.  Felt to have been down 12  to 24 hours.  Patient is being managed for hemorrhagic shock versus septic shock, patient unable to state whether or not he had been having any obvious GI bleeding at home but noted to have significant drop in hemoglobin of over 5 g since July 2025.  Covering with broad-spectrum antibiotics, blood cultures pending Patient is still requiring pressor support for hypotension  Also covering with ceftriaxone, and octreotide and twice daily PPI given concerns for acute GI blood loss prior to admission Transfused 2 units on admit  Current MELD-Na over 40  #2 acute kidney injury in setting of shock, marked lactic  acidosis #3 severe hyponatremia #4 hypokalemia #5 progressive hepatic encephalopathy-worsened overnight 6 coagulopathy secondary to above #7 high risk for EtOH withdrawal  Plan; keep n.p.o. Continue octreotide/twice daily PPI Discussed with CCM-patient will be intubated this morning, and we will plan bedside EGD and hopefully will be able to leave an OG in place for institution of meds and lactulose  Continue to trend hemoglobin and transfuse as indicated to keep his hemoglobin 7 Nephrology to see today On phenobarbital /EtOH withdrawal  I spoke with the patient's [name] who will give verbal consent for endoscopy.  I discussed the procedure including indications risks and benefits and she is agreeable to proceed.  Have asked bedside nurse to get the consent signed.  Patient is critically ill with high risk of mortality this admission  GI will follow with you    Amy Esterwood PA-C 07/04/2024, 11:02 AM     Attending physician's note   I have taken a history, reviewed the chart, and examined the patient. I performed a substantive portion of this encounter, including complete performance of at least one of the key components, in conjunction with the APP. I agree with the APP's note, impression, and recommendations with my edits.  53 year old male with medical history as outlined above, found down with AMS and admitted with shock, suspected 2/2 sepsis.  Reportedly had recent dark emesis and admission H/H 7.6/21 (baseline hemoglobin 13 in July).  Does have a history of EtOH cirrhosis complicated by esophageal varices, hepatic encephalopathy, ascites.  Last EGD was 04/2020 notable for grade 1 esophageal varices and mild PHG.  Was seen in [facility] in 02/2021 and note GI or hepatology follow-up since then.  Admission also notable for profound lactic acidosis (lactate >15), uptrending ammonia (88), elevated liver enzymes with AST/ALT 464/167 and T. bili 19.2, folate deficiency  (4.3), and AKI with BUN/creatinine 90/4.4 (baseline 14/0.8).  1) Coffee-ground emesis 2) Acute blood loss anemia 3) EtOH cirrhosis 4) Esophageal varices 5) Hepatic encephalopathy - Good response to 2 unit RBC transfusion - Plan for expedited EGD today for diagnostic and potentially therapeutic intent - Plan for elective intubation - Continue octreotide - Plan to place OG tube endoscopically and can then initiate lactulose  and other oral medications as needed - Already on broad-spectrum ABX for presumed sepsis  6) AKI - Inpatient Nephrology service consulted for AKI with possible HRS  7) Shock Suspected 2/2 sepsis - Continue broad-spectrum ABX and hypertension management per primary ICU service - Cultures drawn and pending with NGTD  8) AMS Multifactorial with underlying hepatic encephalopathy and superimposed shock.  Plan for elective intubation and will likely remain intubated after procedure  The indications, risks, and benefits of EGD were explained to the patient's sister in detail. Risks include but are not limited to bleeding, perforation, adverse reaction to medications, and cardiopulmonary compromise. Sequelae include but are not limited to the possibility of  surgery, hospitalization, and mortality.    [address], DO, FACG [phone] office

## 2024-07-05 DIAGNOSIS — R579 Shock, unspecified: Secondary | ICD-10-CM | POA: Diagnosis not present

## 2024-07-05 DIAGNOSIS — K3189 Other diseases of stomach and duodenum: Secondary | ICD-10-CM

## 2024-07-05 DIAGNOSIS — K7030 Alcoholic cirrhosis of liver without ascites: Secondary | ICD-10-CM | POA: Diagnosis not present

## 2024-07-05 DIAGNOSIS — F1020 Alcohol dependence, uncomplicated: Secondary | ICD-10-CM | POA: Diagnosis not present

## 2024-07-05 DIAGNOSIS — G9341 Metabolic encephalopathy: Secondary | ICD-10-CM | POA: Diagnosis not present

## 2024-07-05 DIAGNOSIS — K7682 Hepatic encephalopathy: Secondary | ICD-10-CM | POA: Diagnosis not present

## 2024-07-05 DIAGNOSIS — K766 Portal hypertension: Secondary | ICD-10-CM

## 2024-07-05 DIAGNOSIS — K269 Duodenal ulcer, unspecified as acute or chronic, without hemorrhage or perforation: Secondary | ICD-10-CM

## 2024-07-05 DIAGNOSIS — K7290 Hepatic failure, unspecified without coma: Secondary | ICD-10-CM | POA: Diagnosis not present

## 2024-07-05 LAB — COMPREHENSIVE METABOLIC PANEL WITH GFR
ALT: 176 U/L — ABNORMAL HIGH (ref 0–44)
AST: 471 U/L — ABNORMAL HIGH (ref 15–41)
Albumin: 3.4 g/dL — ABNORMAL LOW (ref 3.5–5.0)
Alkaline Phosphatase: 100 U/L (ref 38–126)
Anion gap: 22 — ABNORMAL HIGH (ref 5–15)
BUN: 96 mg/dL — ABNORMAL HIGH (ref 6–20)
CO2: 22 mmol/L (ref 22–32)
Calcium: 7.4 mg/dL — ABNORMAL LOW (ref 8.9–10.3)
Chloride: 80 mmol/L — ABNORMAL LOW (ref 98–111)
Creatinine, Ser: 4.03 mg/dL — ABNORMAL HIGH (ref 0.61–1.24)
GFR, Estimated: 17 mL/min — ABNORMAL LOW (ref >60)
Glucose, Bld: 154 mg/dL — ABNORMAL HIGH (ref 70–99)
Potassium: 3.4 mmol/L — ABNORMAL LOW (ref 3.5–5.1)
Sodium: 124 mmol/L — ABNORMAL LOW (ref 135–145)
Total Bilirubin: 20.2 mg/dL (ref 0.0–1.2)
Total Protein: 5.9 g/dL — ABNORMAL LOW (ref 6.5–8.1)

## 2024-07-05 LAB — CBC
HCT: 21.7 % — ABNORMAL LOW (ref 39.0–52.0)
Hemoglobin: 8 g/dL — ABNORMAL LOW (ref 13.0–17.0)
MCH: 33.5 pg (ref 26.0–34.0)
MCHC: 36.9 g/dL — ABNORMAL HIGH (ref 30.0–36.0)
MCV: 90.8 fL (ref 80.0–100.0)
Platelets: 54 K/uL — ABNORMAL LOW (ref 150–400)
RBC: 2.39 MIL/uL — ABNORMAL LOW (ref 4.22–5.81)
RDW: 17.3 % — ABNORMAL HIGH (ref 11.5–15.5)
WBC: 15.7 K/uL — ABNORMAL HIGH (ref 4.0–10.5)
nRBC: 0.7 % — ABNORMAL HIGH (ref 0.0–0.2)

## 2024-07-05 LAB — GLUCOSE, CAPILLARY
Glucose-Capillary: 124 mg/dL — ABNORMAL HIGH (ref 70–99)
Glucose-Capillary: 130 mg/dL — ABNORMAL HIGH (ref 70–99)
Glucose-Capillary: 132 mg/dL — ABNORMAL HIGH (ref 70–99)
Glucose-Capillary: 154 mg/dL — ABNORMAL HIGH (ref 70–99)
Glucose-Capillary: 163 mg/dL — ABNORMAL HIGH (ref 70–99)
Glucose-Capillary: 194 mg/dL — ABNORMAL HIGH (ref 70–99)

## 2024-07-05 LAB — BASIC METABOLIC PANEL WITH GFR
Anion gap: 21 — ABNORMAL HIGH (ref 5–15)
BUN: 97 mg/dL — ABNORMAL HIGH (ref 6–20)
CO2: 22 mmol/L (ref 22–32)
Calcium: 7.5 mg/dL — ABNORMAL LOW (ref 8.9–10.3)
Chloride: 79 mmol/L — ABNORMAL LOW (ref 98–111)
Creatinine, Ser: 4.25 mg/dL — ABNORMAL HIGH (ref 0.61–1.24)
GFR, Estimated: 16 mL/min — ABNORMAL LOW (ref >60)
Glucose, Bld: 123 mg/dL — ABNORMAL HIGH (ref 70–99)
Potassium: 3.4 mmol/L — ABNORMAL LOW (ref 3.5–5.1)
Sodium: 122 mmol/L — ABNORMAL LOW (ref 135–145)

## 2024-07-05 LAB — AMMONIA: Ammonia: 93 umol/L — ABNORMAL HIGH (ref 9–35)

## 2024-07-05 LAB — MAGNESIUM: Magnesium: 1.9 mg/dL (ref 1.7–2.4)

## 2024-07-05 LAB — TRIGLYCERIDES: Triglycerides: 100 mg/dL (ref <150)

## 2024-07-05 LAB — PHOSPHORUS: Phosphorus: 1.7 mg/dL — ABNORMAL LOW (ref 2.5–4.6)

## 2024-07-05 LAB — VANCOMYCIN, TROUGH: Vancomycin Tr: 19 ug/mL (ref 15–20)

## 2024-07-05 MED ORDER — VITAL 1.5 CAL PO LIQD
1000.0000 mL | ORAL | Status: DC
  Administered 2024-07-05 – 2024-07-10 (×5): 1000 mL

## 2024-07-05 MED ORDER — POTASSIUM CHLORIDE 20 MEQ PO PACK
40.0000 meq | PACK | Freq: Once | ORAL | Status: AC
  Administered 2024-07-05: 40 meq
  Filled 2024-07-05: qty 2

## 2024-07-05 MED ORDER — SODIUM PHOSPHATES 45 MMOLE/15ML IV SOLN
15.0000 mmol | Freq: Once | INTRAVENOUS | Status: AC
  Administered 2024-07-05: 15 mmol via INTRAVENOUS
  Filled 2024-07-05: qty 5

## 2024-07-05 MED ORDER — PROSOURCE TF20 ENFIT COMPATIBL EN LIQD
60.0000 mL | Freq: Every day | ENTERAL | Status: DC
  Administered 2024-07-05 – 2024-07-12 (×8): 60 mL
  Filled 2024-07-05 (×8): qty 60

## 2024-07-05 MED ORDER — MAGNESIUM SULFATE 2 GM/50ML IV SOLN
2.0000 g | Freq: Once | INTRAVENOUS | Status: AC
  Administered 2024-07-05: 2 g via INTRAVENOUS
  Filled 2024-07-05: qty 50

## 2024-07-05 MED ORDER — THIAMINE HCL 100 MG/ML IJ SOLN
100.0000 mg | INTRAMUSCULAR | Status: DC
  Administered 2024-07-09 – 2024-07-12 (×4): 100 mg via INTRAVENOUS
  Filled 2024-07-05 (×4): qty 2

## 2024-07-05 MED ORDER — VANCOMYCIN HCL IN DEXTROSE 1-5 GM/200ML-% IV SOLN
1000.0000 mg | Freq: Once | INTRAVENOUS | Status: AC
  Administered 2024-07-05: 1000 mg via INTRAVENOUS
  Filled 2024-07-05: qty 200

## 2024-07-05 MED ORDER — RIFAXIMIN 550 MG PO TABS
550.0000 mg | ORAL_TABLET | Freq: Three times a day (TID) | ORAL | Status: DC
  Administered 2024-07-05 – 2024-07-12 (×22): 550 mg
  Filled 2024-07-05 (×22): qty 1

## 2024-07-05 MED ORDER — FENTANYL BOLUS VIA INFUSION: 25.0000 ug | INTRAVENOUS | Status: DC | PRN

## 2024-07-05 NOTE — Progress Notes (Signed)
 Nephrology Follow-Up Consult note   Assessment/Recommendations: [name] is a/an 53 y.o. male with a past medical history significant for alcohol  dependence, history of withdrawal seizures, liver cirrhosis complicated by varices, schizoaffective disorder who present w/ shock complicated by AKI   Severe Nonoliguric AKI: Baseline creatinine normal.  Creatinine 4.8 on arrival and has improved slightly.  UOP decent at this time despite significant pressor requirement.  Likely has some HRS type physiology as well as ATN at this point -Agree with norepinephrine , albumin , octreotide -> treating potential HRS -Continue to monitor urine output closely -Consider CRRT based on progress; no acute indication for RRT at this time -Recommend advancing goals of care conversations  -Continue to monitor daily Cr, Dose meds for GFR -Monitor Daily I/Os, Daily weight  -Maintain MAP>65 for optimal renal perfusion.  -Avoid nephrotoxic medications including NSAIDs -Use synthetic opioids (Fentanyl /Dilaudid ) if needed   Severe shock: Likely septic component compounded on cirrhosis.  Significant lactic acidosis related to shock and liver failure improving.  Continue with supportive care with norepinephrine /vaso.  Antibiotics per primary team as well   GI bleed: On octreotide.  History of varices.  EGD only with friable mucosa.    Altered mental status: Likely multifactorial.  Uremia may be playing some role but likely hepatic encephalopathy a driving factor.  Continue with lactulose . Consioder HD if he fails to improve   Hyponatremia: Unlikely the cause of his altered mental status but likely associated with cirrhosis and AKI.  Continue supportive care.  Continue to monitor 2-3 times daily focusing on supportive care.     Alcoholic cirrhosis: Management per primary team and GI   Recommendations conveyed to primary service.    [facility] 07/05/2024 8:49  AM  ___________________________________________________________  CC: Found down  Interval History/Subjective: Underwent EGD yesterday mostly with esophagitis and other areas of inflamed mucosa.  No overt bleeding.  Remained intubated after procedure.  Urine output fairly good at 1.4 L.  Creatinine stable.  Remains critically ill on norepinephrine  and vasopressin    Medications:  Current Facility-Administered Medications  Medication Dose Route Frequency Provider Last Rate Last Admin   0.9 %  sodium chloride  infusion (Manually program via Guardrails IV Fluids)   Intravenous Once Mesner, Selinda, MD       albumin  human 5 % solution 25 g  25 g Intravenous Q6H Hussein, Lenny, MD 60 mL/hr at 07/05/24 0600 Infusion Verify at 07/05/24 0600   Chlorhexidine  Gluconate Cloth 2 % PADS 6 each  6 each Topical Daily Kara Dorn NOVAK, MD   6 each at 07/04/24 0900   docusate sodium  (COLACE) capsule 100 mg  100 mg Oral BID PRN Kara Dorn NOVAK, MD       fentaNYL  (SUBLIMAZE ) bolus via infusion 20 mcg  20 mcg Intravenous Q30 min PRN Sharie Lenny, MD   20 mcg at 07/04/24 2327   fentaNYL  in NS (41mcg/ml) infusion-PREMIX  0-400 mcg/hr Intravenous Continuous Sharie Lenny, MD 2.5 mL/hr at 07/05/24 0600 25 mcg/hr at 07/05/24 0600   Gerhardt's butt cream   Topical BID Sharie Lenny, MD       heparin  injection 1,000-6,000 Units  1,000-6,000 Units CRRT PRN Sharie Lenny, MD   2,400 Units at 07/04/24 1235   hydrocortisone sodium succinate (SOLU-CORTEF) 100 MG injection 100 mg  100 mg Intravenous Q8H Hussein, Lenny, MD   100 mg at 07/05/24 0244   insulin  aspart (novoLOG ) injection 0-9 Units  0-9 Units Subcutaneous TID WC Sharie Lenny, MD   1  Units at 07/05/24 9178   lactulose  (CHRONULAC ) 10 GM/15ML solution 30 g  30 g Per Tube TID Sharie Bourbon, MD       LORazepam  (ATIVAN ) injection 1-2 mg  1-2 mg Intravenous Q1H PRN Sharie Bourbon, MD       norepinephrine   (LEVOPHED ) 16 mg in (0.064 mg/mL) premix infusion  0-40 mcg/min Intravenous Titrated Millen, Jessica B, RPH 27.2 mL/hr at 07/05/24 0600 29 mcg/min at 07/05/24 0600   octreotide (SANDOSTATIN) 500 mcg in sodium chloride  0.9 % 250 mL (2 mcg/mL) infusion  50 mcg/hr Intravenous Continuous Mesner, Selinda, MD 25 mL/hr at 07/05/24 0600 50 mcg/hr at 07/05/24 0600   Oral care mouth rinse  15 mL Mouth Rinse Q2H Sharie Bourbon, MD   15 mL at 07/05/24 9163   Oral care mouth rinse  15 mL Mouth Rinse PRN Sharie Bourbon, MD       pantoprazole  (PROTONIX ) injection 40 mg  40 mg Intravenous Q12H Kara Dorn NOVAK, MD   40 mg at 07/04/24 2126   PHENObarbital  (LUMINAL) injection 130 mg  130 mg Intravenous Daily Sharie Bourbon, MD   130 mg at 07/04/24 0909   piperacillin-tazobactam (ZOSYN) IVPB 3.375 g  3.375 g Intravenous Q8H Hussein, Bourbon, MD 12.5 mL/hr at 07/05/24 0600 Infusion Verify at 07/05/24 0600   polyethylene glycol (MIRALAX  / GLYCOLAX ) packet 17 g  17 g Oral Daily PRN Kara Dorn NOVAK, MD       propofol  (DIPRIVAN ) 1000 MG/100ML infusion  0-80 mcg/kg/min Intravenous Titrated Sharie Bourbon, MD 5.63 mL/hr at 07/05/24 0636 10 mcg/kg/min at 07/05/24 0636   rifaximin  (XIFAXAN ) tablet 550 mg  550 mg Per Tube TID Sharie Bourbon, MD       thiamine  (VITAMIN B1) 500 mg in sodium chloride  0.9 % 50 mL IVPB  500 mg Intravenous Daily Sharie Bourbon, MD   Stopped at 07/04/24 1706   thiamine  (VITAMIN B1) injection 100 mg  100 mg Intravenous Q24H Kara Dorn B, MD   100 mg at 07/04/24 0900   vancomycin  variable dose per unstable renal function (pharmacist dosing)   Does not apply See admin instructions Kara Dorn NOVAK, MD       vasopressin  (PITRESSIN) 20 Units in 100 mL (0.2 unit/mL) infusion-*FOR SHOCK*  0-0.03 Units/min Intravenous Continuous Sharie Bourbon, MD 9 mL/hr at 07/05/24 0600 0.03 Units/min at 07/05/24 0600      Review of Systems: Unable to obtain due to the  patient's altered mental status/sedation  Physical Exam: Vitals:   07/05/24 0730 07/05/24 0808  BP:    Pulse:    Resp:    Temp:  99.2 F (37.3 C)  SpO2: 94%    Total I/O In: -  Out: 175 [Urine:175]  Intake/Output Summary (Last 24 hours) at 07/05/2024 0849 Last data filed at 07/05/2024 0744 Gross per 24 hour  Intake 3292.75 ml  Output 1570 ml  Net 1722.75 ml   Constitutional: critically-appearing, no acute distress ENMT: icteric sclera, ears and nose without scars or lesions CV: normal rate, 1+ pitting edema in the bilateral lower extremities Respiratory: Bilateral breath sounds, bilateral chest rise, frequent coughing, ventilated Gastrointestinal: Moderate distention, nontender Skin: Jaundice present Psych: Sedated, not interactive   Test Results I personally reviewed new and old clinical labs and radiology tests Lab Results  Component Value Date   NA 122 (L) 07/05/2024   K 3.4 (L) 07/05/2024   CL 79 (L) 07/05/2024   CO2 22 07/05/2024   BUN 97 (H) 07/05/2024   CREATININE 4.25 (H)  07/05/2024   GFR 93.70 12/20/2020   CALCIUM  7.5 (L) 07/05/2024   ALBUMIN  2.1 (L) 07/03/2024   PHOS 7.5 (H) 07/03/2024    CBC Recent Labs  Lab 07/03/24 0257 07/03/24 0448 07/04/24 0420 07/04/24 1316 07/04/24 1748 07/05/24 0428  WBC 14.1*   < > 15.6*  --  13.3* 15.7*  NEUTROABS 12.1*  --   --   --   --   --   HGB 7.6*   < > 9.1* 9.2* 8.7* 8.0*  HCT 21.2*   < > 24.3* 27.0* 23.7* 21.7*  MCV 96.4   < > 91.0  --  91.9 90.8  PLT 130*   < > 60*  --  63* 54*   < > = values in this interval not displayed.

## 2024-07-05 NOTE — Progress Notes (Addendum)
 NAME:  Shahzain Kiester, MRN:  [id number], DOB:  1971/05/16, LOS: 2 ADMISSION DATE:  07/03/2024, CONSULTATION DATE:  07/03/24 REFERRING MD:  EDP CHIEF COMPLAINT:  Shock   History of Present Illness:  Bon Dowis is a 53 year old male, daily smoker with history of alcohol  dependence, withdrawal seizures, liver cirrhosis, PTSD, and schizoaffective disorder who is brought in by EMS after falling and being found on the floor covered in urine and feces.   In the ER, vitals showed temp of 94.72F, BP 74/39, RR 25, Spo2 94% on 4L O2.   Labs show Na 126, K 3.1, Cl <65, Serum bicarb 17, Glucose 61, Cr 4.83 (baseline 0.79), Alk phos 171, albumin  2.1, AST/ALT 384/134, Ammonia 77, T. Bili 15.5, CK 285, Trop 25. CBC shows WBC 14, Hgb 7.6g/dL, plts 869. Lactic acid >15.  He reports throwing up dark blood over the past 2 days. He has epigastric discomfort. He is alert to person and time but not place.   CT Head without acute abnormality. CT C-spine without acute abnormality. Chest radiograph unremarkable.  PCCM called for admission.   Pertinent  Medical History   Past Medical History:  Diagnosis Date   Alcohol  abuse    Anxiety    at age 57   Blood transfusion without reported diagnosis    Cirrhosis (HCC)    Depression    at age 59   Epileptic seizure (HCC)    Not associated with EtOH withdrawal   GERD (gastroesophageal reflux disease)    Hypertension    Nausea and vomiting    Psychosis (HCC)    PTSD (post-traumatic stress disorder)    Schizoaffective disorder    Seizure disorder (HCC)    related to etoh seizure   Symptomatic anemia    Significant Hospital Events: Including procedures, antibiotic start and stop dates in addition to other pertinent events   11/16 admitted to ICU for shock.  Interim History / Subjective:  Intubated, on mechanical ventilation and on sedation  Hb stable 8, weaning from high vasopressors Encephalopathic unable to extubate due to high BUN in the 90s, and  high ammonia-received first dose of lactulose  p.m. of 11/17.  Will continue with lactulose  30 TID, rifaximin  and recheck Was challenged with 120 of Lasix  on 11/17, and made 1.2 L of UOP   Bedside egd 11/17-severe esophagitis in mid-lower esophagus with acute esophageal necrosis. OG placed by GI via EGD and taped, okay to use after 6hrs     Objective    Blood pressure 133/60, pulse 96, temperature 99.2 F (37.3 C), temperature source Axillary, resp. rate (!) 28, height 6' (1.829 m), weight 94.9 kg, SpO2 92%.    Vent Mode: PSV;CPAP FiO2 (%):  [40 %-100 %] 40 % Set Rate:  [20 bmp] 20 bmp Vt Set:  [620 mL] 620 mL PEEP:  [5 cmH20] 5 cmH20 Pressure Support:  [10 cmH20] 10 cmH20 Plateau Pressure:  [25 cmH20-27 cmH20] 27 cmH20   Intake/Output Summary (Last 24 hours) at 07/05/2024 1037 Last data filed at 07/05/2024 0900 Gross per 24 hour  Intake 3410.03 ml  Output 1570 ml  Net 1840.03 ml   Filed Weights   07/03/24 0220 07/04/24 0400 07/05/24 0500  Weight: 97.5 kg 93.8 kg 94.9 kg    Examination: General: Intubated, chronically ill-appearing, HEENT: NCAT, dry mucous membrane Lungs: Clear to auscultation, symmetric chest wall movement CVS: S1-S2 normal, no murmur Abdomen: Soft and nontender nondistended, Extremities: Warm, no edema Neuro: Intubated on mechanical ventilation and sedation GU:  Deferred  Resolved problem list   Assessment and Plan  53 year old male with a history of alcohol  use, withdrawal seizures, liver cirrhosis, presents with altered mental status, and hematemesis. Patient is being managed in the ICU for septic &hemorrhagic shock, and upper GI bleed.  Acute metabolic encephalopathy-multifactorial(hyperammonemia, and high BUN) History of liver cirrhosis and hyperammonemia Alcoholic cirrhosis, with transaminitis and hyperbilirubinemia- - Child Pugh Score 11, Class C,  - MELD-Na score 39 High risk for alcohol  withdrawal-on phenobarb Septic shock-on  vasopressors Upper GI bleed AKI on CKD-anuria   Plan - ICU for monitoring and management - Wean off sedation & mechanical ventilation as tolerated - Wean off vasopressors as tolerated to maintain maps > 65 - S/p EGD on 11/19 OG placed by GI and taped. - Continue lactulose  30 TID, and rifaximin  - Serial H&H - Continue phenobarb for High risk alcohol  withdrawal - Continue high-dose thiamine  - Continue Zosyn - Nephrology team on board-closely following for renal replacement if hyperammonemia and high BUN persists   Labs   CBC: Recent Labs  Lab 07/03/24 0257 07/03/24 0448 07/03/24 0640 07/03/24 1946 07/04/24 0420 07/04/24 1316 07/04/24 1748 07/05/24 0428  WBC 14.1* 13.0*  --  14.3* 15.6*  --  13.3* 15.7*  NEUTROABS 12.1*  --   --   --   --   --   --   --   HGB 7.6* 8.7*   < > 9.8* 9.1* 9.2* 8.7* 8.0*  HCT 21.2* 24.7*   < > 27.5* 24.3* 27.0* 23.7* 21.7*  MCV 96.4 97.2  --  94.8 91.0  --  91.9 90.8  PLT 130* 102*  --  PLATELET CLUMPS NOTED ON SMEAR, UNABLE TO ESTIMATE 60*  --  63* 54*   < > = values in this interval not displayed.    Basic Metabolic Panel: Recent Labs  Lab 07/03/24 0257 07/03/24 0448 07/03/24 0640 07/03/24 1946 07/04/24 0420 07/04/24 1214 07/04/24 1316 07/04/24 1748 07/05/24 0428  NA 126*  --    < > 122* 123* 123* 121* 122* 122*  K 3.1*  --    < > 3.6 3.4* 3.2* 3.2* 3.8 3.4*  CL <65*  --   --  74* 77* 79*  --  78* 79*  CO2 17*  --   --  17* 25 25  --  25 22  GLUCOSE 61*  --   --  194* 149* 110*  --  115* 123*  BUN 79*  --   --  84* 90* 93*  --  95* 97*  CREATININE 4.83*  --   --  4.41* 4.40* 4.25*  --  4.40* 4.25*  CALCIUM  8.1*  --   --  7.8* 7.8* 7.7*  --  7.7* 7.5*  MG 1.7 1.5*  --   --  2.4  --   --   --   --   PHOS  --  7.5*  --   --   --   --   --   --   --    < > = values in this interval not displayed.   GFR: Estimated Creatinine Clearance: 24.6 mL/min (A) (by C-G formula based on SCr of 4.25 mg/dL (H)). Recent Labs  Lab  07/03/24 1434 07/03/24 1946 07/04/24 0420 07/04/24 0744 07/04/24 1312 07/04/24 1748 07/04/24 2201 07/05/24 0428  WBC  --  14.3* 15.6*  --   --  13.3*  --  15.7*  LATICACIDVEN >9.0*  --   --  2.5* 2.1*  --  2.6*  --     Liver Function Tests: Recent Labs  Lab 07/03/24 0257 07/03/24 1946  AST 384* 464*  ALT 134* 167*  ALKPHOS 171* 152*  BILITOT 15.5* 19.2*  PROT 4.9* 4.8*  ALBUMIN  2.1* 2.1*   No results for input(s): LIPASE, AMYLASE in the last 168 hours. Recent Labs  Lab 07/03/24 0257 07/04/24 0420  AMMONIA 77* 88*    ABG    Component Value Date/Time   PHART 7.431 07/04/2024 1316   PCO2ART 48.1 (H) 07/04/2024 1316   PO2ART 343 (H) 07/04/2024 1316   HCO3 32.5 (H) 07/04/2024 1316   TCO2 34 (H) 07/04/2024 1316   ACIDBASEDEF 4.0 (H) 07/03/2024 0640   O2SAT 100 07/04/2024 1316     Coagulation Profile: Recent Labs  Lab 07/03/24 0257  INR 2.1*    Cardiac Enzymes: Recent Labs  Lab 07/03/24 0257  CKTOTAL 285    HbA1C: Hgb A1c MFr Bld  Date/Time Value Ref Range Status  07/03/2024 12:38 PM 5.1 4.8 - 5.6 % Final    Comment:    (NOTE) Diagnosis of Diabetes The following HbA1c ranges recommended by the American Diabetes Association (ADA) may be used as an aid in the diagnosis of diabetes mellitus.  Hemoglobin             Suggested A1C NGSP%              Diagnosis  <5.7                   Non Diabetic  5.7-6.4                Pre-Diabetic  >6.4                   Diabetic  <7.0                   Glycemic control for                       adults with diabetes.    09/07/2021 03:01 PM 5.5 4.8 - 5.6 % Final    Comment:    (NOTE)         Prediabetes: 5.7 - 6.4         Diabetes: >6.4         Glycemic control for adults with diabetes: <7.0     CBG: Recent Labs  Lab 07/04/24 1918 07/04/24 2249 07/04/24 2307 07/05/24 0307 07/05/24 0807  GLUCAP 123* 134* 139* 124* 132*    Review of Systems:   Review of Systems  Constitutional:  Positive  for malaise/fatigue. Negative for chills, fever and weight loss.  HENT:  Negative for congestion, sinus pain and sore throat.   Eyes: Negative.   Respiratory:  Negative for cough, hemoptysis, sputum production, shortness of breath and wheezing.   Cardiovascular:  Negative for chest pain, palpitations, orthopnea, claudication and leg swelling.  Gastrointestinal:  Positive for blood in stool, nausea and vomiting. Negative for abdominal pain and heartburn.  Genitourinary: Negative.   Musculoskeletal:  Negative for joint pain and myalgias.  Skin:  Negative for rash.  Neurological:  Positive for weakness.  Endo/Heme/Allergies: Negative.   Psychiatric/Behavioral:  Positive for depression.    Past Medical History:  He,  has a past medical history of Alcohol  abuse, Anxiety, Blood transfusion without reported diagnosis, Cirrhosis (HCC), Depression, Epileptic seizure (HCC), GERD (gastroesophageal reflux disease), Hypertension, Nausea and vomiting, Psychosis (HCC), PTSD (post-traumatic stress disorder), Schizoaffective disorder,  Seizure disorder (HCC), and Symptomatic anemia.   Surgical History:   Past Surgical History:  Procedure Laterality Date   ESOPHAGOGASTRODUODENOSCOPY (EGD) WITH PROPOFOL  N/A 05/05/2020   Procedure: ESOPHAGOGASTRODUODENOSCOPY (EGD) WITH PROPOFOL ;  Surgeon: Shila Gustav GAILS, MD;  Location: [facility];  Service: Endoscopy;  Laterality: N/A;   FOOT SURGERY     right   HAND SURGERY       Social History:   reports that he has been smoking cigarettes. He has a 45 pack-year smoking history. He has never used smokeless tobacco. He reports current alcohol  use. He reports current drug use. Frequency: 1.00 time per week. Drug: Marijuana.   Family History:  His family history includes Alcoholism in his mother; CAD in his father; Depression in his brother. There is no history of Colon polyps, Esophageal cancer, Pancreatic cancer, or Stomach cancer.   Allergies Allergies   Allergen Reactions   Bupropion Other (See Comments)    Caused seizures, Feel as though I will have a seizure when taking this med     Home Medications  Prior to Admission medications   Medication Sig Start Date End Date Taking? Authorizing Provider  allopurinol  (ZYLOPRIM ) 100 MG tablet Take 1 tablet (100 mg total) by mouth daily. 01/15/24   Elgergawy, Brayton RAMAN, MD  folic acid  (FOLVITE ) 1 MG tablet Take 1 tablet (1 mg total) by mouth daily. 03/11/24   Hongalgi, Anand D, MD  gabapentin  (NEURONTIN ) 300 MG capsule Take 1 capsule (300 mg total) by mouth 2 (two) times daily. 01/15/24   Elgergawy, Brayton RAMAN, MD  Lactulose  20 GM/30ML SOLN Take 15 mLs (10 g total) by mouth 3 (three) times daily. 06/30/24   Tanda Bleacher, MD  levETIRAcetam  (KEPPRA ) 500 MG tablet Take 1 tablet (500 mg total) by mouth 2 (two) times daily. 03/10/24 05/09/24  Hongalgi, Anand D, MD  Multiple Vitamin (MULTIVITAMIN WITH MINERALS) TABS tablet Take 1 tablet by mouth daily. 03/11/24   Hongalgi, Anand D, MD  pantoprazole  (PROTONIX ) 40 MG tablet Take 1 tablet (40 mg total) by mouth daily. 06/21/24   Tanda Bleacher, MD  QUEtiapine  (SEROQUEL ) 200 MG tablet Take 1 tablet (200 mg total) by mouth at bedtime. 06/21/24 06/21/25  Tanda Bleacher, MD  sertraline  (ZOLOFT ) 25 MG tablet Take 1 tablet (25 mg total) by mouth daily. 03/12/23 03/11/24  Von Bellis, MD  thiamine  (VITAMIN B-1) 100 MG tablet Take 1 tablet (100 mg total) by mouth daily. 03/11/24   Judeth Trenda BIRCH, MD     Critical care time: 30 minutes     Lenny Drought, MD  [facility] Prefer epic messenger for cross cover needs    Critical care time was exclusive of separately billable procedures and treating other patients.  Critical care was necessary to treat or prevent imminent or life-threatening deterioration.  Critical care was time spent personally by me on the following activities: development of treatment plan with patient and/or surrogate as  well as nursing, discussions with consultants, evaluation of patient's response to treatment, examination of patient, obtaining history from patient or surrogate, ordering and performing treatments and interventions, ordering and review of laboratory studies, ordering and review of radiographic studies, pulse oximetry, re-evaluation of patient's condition and participation in multidisciplinary rounds.

## 2024-07-05 NOTE — Progress Notes (Signed)
 Initial Nutrition Assessment  DOCUMENTATION CODES:   Not applicable At this time, unable to diagnose malnutrition given limited information and exam, but suspect malnutrition is present.   INTERVENTION:  Initiate tube feeding via OG: starting trickles today Vital 1.5 at 20 ml/h (480 ml per day) Prosource TF20 60 ml daily   Goal rate for tube feeds once advancement recommend: increase 10 ml q10h until goal rate reached Vital 1.5 at 60 ml/h (480 ml per day) Prosource TF20 60 ml daily Provides 2240 kcal, 117 gm protein, 1100 ml free water  daily  Monitor magnesium  and phosphorus daily x 4 occurrences, MD to replete as needed, as pt is at risk for refeeding syndrome given suspicion of malnutrition and alcohol  abuse. Continue thiamine  daily 500 mg IVPB per CIWA protocol  NUTRITION DIAGNOSIS:   Inadequate oral intake related to inability to eat as evidenced by NPO status.   GOAL:   Patient will meet greater than or equal to 90% of their needs  MONITOR:   Vent status, Labs, TF tolerance  REASON FOR ASSESSMENT:   Consult Assessment of nutrition requirement/status  ASSESSMENT:   Pt with hx of alcohol  abuse related cirrhosis, esophageal varices, schizoaffective disorder, alcohol  withdrawal related seizures, and tobacco use. Admitted after being found down with reported bloody emesis the past few days, diagnosed shock and placed in CIWA protocol w/ concern of GI bleed.  11/16 admitted 11/17 underwent EGD; diagnosed severe esophagitis, duodenitis, multiple biopsied sites; remained intubated following EGD  Discussed pt with RN and CCM. Pt will remain intubated on vent support. Pt may begin CRRT due to elevated creatinine and fluid retention. Nephrology following and recommending GOC discussions. CCM approve initiating trickle feeds via OG to maintain gut integrity. Unable to obtain diet or wt hx but suspect malnutrition given hx of alcohol  abuse and cirrhosis. Pt's wt trends in chart  difficult to interpret given pt's previous issues with fluid overload which may be masking any true wt loss over last year. Pt's physical exam shows moderate muscle depletions, but unable to assess subcutaneous fat depletions given edema in those areas. At this time, unable to diagnose malnutrition given limited information and exam, but suspect malnutrition is present.    If pt is started on CRRT, recommend increasing Prosource frequency if tube feeds cannot be advanced.   Patient is currently intubated on ventilator support MV: 14.8 L/min Temp (24hrs), Avg:98.6 F (37 C), Min:97.8 F (36.6 C), Max:99.2 F (37.3 C) MAP (aline): 63-14mmHg  Propofol : 5.63 ml/hr (provides additional 148 kcal per day at current rate)  Admit weight: 97.5 kg  Current weight: 94.9 kg   Intake/Output Summary (Last 24 hours) at 07/05/2024 1230 Last data filed at 07/05/2024 0900 Gross per 24 hour  Intake 3234.56 ml  Output 1570 ml  Net 1664.56 ml   Net IO Since Admission: 9,609.5 mL [07/05/24 1230]  Drains/Lines: HD cathether R internal jugular A line R radial OG gastric per xray Urethral Catheter: 1435 ml x 24 h   Nutritionally Relevant Medications: Scheduled Meds:  insulin  aspart  0-9 Units Subcutaneous TID WC   lactulose   30 g Per Tube TID   pantoprazole  (PROTONIX ) IV  40 mg Intravenous Q12H   Potassium chloride  40 mEq     PHENObarbital   130 mg Intravenous Daily   thiamine  (VITAMIN B1) injection  100 mg Intravenous Q24H   Continuous Infusions:  albumin  human 60 mL/hr at 07/05/24 0600   fentaNYL  infusion INTRAVENOUS 25 mcg/hr (07/05/24 0600)   norepinephrine  (LEVOPHED ) Adult infusion  29 mcg/min (07/05/24 0600)   octreotide (SANDOSTATIN) 500 mcg in sodium chloride  0.9 % 250 mL (2 mcg/mL) infusion 50 mcg/hr (07/05/24 0600)   piperacillin-tazobactam (ZOSYN)  IV 12.5 mL/hr at 07/05/24 0600   propofol  (DIPRIVAN ) infusion 10 mcg/kg/min (07/05/24 0636)   thiamine  (VITAMIN B1) injection Stopped  (07/04/24 1706)   vasopressin  0.03 Units/min (07/05/24 0600)    Labs Reviewed: Sodium 122 Potassium 3.4 (low) Magnesium  2.4 (WNL) Phosphorus 7.5 (high) CBG ranges from 123-139 mg/dL over the last 24 hours HgbA1c 5.1   NUTRITION - FOCUSED PHYSICAL EXAM: Pt presents with generalized edema and unable to assess multiple subcutaneous fat areas but suspect pt is malnourished given hx and prevalence of muscle depletion  Flowsheet Row Most Recent Value  Orbital Region Unable to assess  Upper Arm Region Unable to assess  [bilateral edema]  Thoracic and Lumbar Region No depletion  Buccal Region Unable to assess  Temple Region Moderate depletion  Clavicle Bone Region Unable to assess  [edema]  Clavicle and Acromion Bone Region Unable to assess  [edema]  Scapular Bone Region Mild depletion  Dorsal Hand Unable to assess  [edema]  Patellar Region Moderate depletion  Anterior Thigh Region Moderate depletion  Posterior Calf Region Unable to assess  [edema]  Edema (RD Assessment) Mild  [generalized non pitting]  Hair Reviewed  Eyes Unable to assess  Mouth Unable to assess  Skin Reviewed  [jaundice]  Nails Reviewed    Diet Order:   Diet Order             Diet NPO time specified  Diet effective midnight                   EDUCATION NEEDS:   Not appropriate for education at this time  Skin:  Skin Assessment: Skin Integrity Issues: Skin Integrity Issues:: Unstageable Unstageable: L and R toes  Last BM:  PTA  Height:   Ht Readings from Last 1 Encounters:  07/03/24 6' (1.829 m)    Weight:   Wt Readings from Last 1 Encounters:  07/05/24 94.9 kg    Ideal Body Weight:  80.9 kg  BMI:  Body mass index is 28.37 kg/m.  Estimated Nutritional Needs:   Kcal:  2200-2400  Protein:  115-135g  Fluid:  2.2-2.4L    Josette Glance, MS, RDN, LDN Clinical Dietitian I Please reach out via secure chat

## 2024-07-05 NOTE — Progress Notes (Signed)
 Pharmacy Antibiotic Note  [name] is a 53 y.o. male admitted on 07/03/2024 with AKI and possible septic shock in setting of GIB.  Pharmacy has been consulted for Vancomycin  and Cefepime > change to zosyn dosing.  Vancomycin  2000 mg IV given in ED Cr up 4 crcl >32ml/min Vancomycin  random level 24hr after dose 28 Vancomycin  random level 48h after dose 19 - at trough level  Plan: Redose Vancomycin  1gm IV x1 and continue to monitor renal function Zosyn 3.375gm IV q8h EI  Height: 6' (182.9 cm) Weight: 94.9 kg (209 lb 3.5 oz) IBW/kg (Calculated) : 77.6  Temp (24hrs), Avg:98.6 F (37 C), Min:97.8 F (36.6 C), Max:99.2 F (37.3 C)  Recent Labs  Lab 07/03/24 0448 07/03/24 0510 07/03/24 1238 07/03/24 1434 07/03/24 1946 07/04/24 0146 07/04/24 0420 07/04/24 0744 07/04/24 1214 07/04/24 1312 07/04/24 1748 07/04/24 2201 07/05/24 0428 07/05/24 1218  WBC 13.0*  --   --   --  14.3*  --  15.6*  --   --   --  13.3*  --  15.7*  --   CREATININE  --   --   --   --  4.41*  --  4.40*  --  4.25*  --  4.40*  --  4.25*  --   LATICACIDVEN  --    < > >9.0* >9.0*  --   --   --  2.5*  --  2.1*  --  2.6*  --   --   VANCOTROUGH  --   --   --   --   --   --   --   --   --   --   --   --   --  19  VANCORANDOM  --   --   --   --   --  28  --   --   --   --   --   --   --   --    < > = values in this interval not displayed.    Estimated Creatinine Clearance: 24.6 mL/min (A) (by C-G formula based on SCr of 4.25 mg/dL (H)).    Allergies  Allergen Reactions   Bupropion Other (See Comments)    Caused seizures, Feel as though I will have a seizure when taking this med     Olam Chalk Pharm.D. CPP, BCPS Clinical Pharmacist [phone] 07/05/2024 2:25 PM

## 2024-07-05 NOTE — TOC Progression Note (Signed)
 Transition of Care [facility]) - Progression Note    Patient Details  Name: [name] MRN: [id number] Date of Birth: 07/21/1971  Transition of [facility]) CM/SW Contact  Justina Delcia Czar, RN Phone Number: [phone] 07/05/2024, 2:48 PM  Clinical Narrative:     Patient admitted with Sepsis, AKI and GIB, currently receiving IV abx. Pt was independent pta from home.   Chart reviewed for discharge readiness, patient not medically stable for d/c. Inpatient CM/CSW will continue to monitor pt's advancement through interdisciplinary progression rounds.    If new pt transition needs arise, MD please place a TOC consult.    Expected Discharge Plan: Home/Self Care Barriers to Discharge: Continued Medical Work up    Expected Discharge Plan and Services In-house Referral: Clinical Social Work     Living arrangements for the past 2 months: Single Family Home                                       Social Drivers of Health (SDOH) Interventions SDOH Screenings   Food Insecurity: Food Insecurity Present (03/06/2024)  Housing: Low Risk  (03/06/2024)  Recent Concern: Housing - High Risk (01/08/2024)  Transportation Needs: Unmet Transportation Needs (03/06/2024)  Utilities: Not At Risk (03/06/2024)  Recent Concern: Utilities - At Risk (01/08/2024)  Alcohol  Screen: High Risk (12/01/2023)  Depression (PHQ2-9): Medium Risk (01/28/2024)  Financial Resource Strain: High Risk (12/01/2023)  Physical Activity: Insufficiently Active (12/01/2023)  Social Connections: Moderately Integrated (12/01/2023)  Stress: Stress Concern Present (12/01/2023)  Tobacco Use: High Risk (07/04/2024)  Health Literacy: Adequate Health Literacy (12/01/2023)    Readmission Risk Interventions    03/09/2024    1:39 PM  Readmission Risk Prevention Plan  Transportation Screening Complete  Medication Review (RN Care Manager) Complete  PCP or Specialist appointment within 3-5 days of discharge Complete  HRI or Home  Care Consult Complete  SW Recovery Care/Counseling Consult Complete  Palliative Care Screening Not Applicable  Skilled Nursing Facility Not Applicable

## 2024-07-05 NOTE — Progress Notes (Addendum)
 Patient ID: [name], male   DOB: August 20, 1970, 53 y.o.   MRN: [id number]    Progress Note   Subjective   Day # 2 CC; hemorrhagic shock versus septic shock, anemia, significant drop in hemoglobin.  Patient found down at home, history of EtOH use disorder, cirrhosis.  MELD NA = 39  Discriminant function score=62  IV PPI twice daily IV octreotide Remains on pressor support Intubated   EGD yesterday-severe esophagitis with no bleeding found in the mid and lower third of the esophagus, this was characterized by black tissue bordering on acute esophageal necrosis-moderate portal hypertensive gastropathy involving the entire stomach, diffuse severe inflammation characterized by edema and erythema and shallow ulcerations in the duodenal bulb and into the 1st and 2nd portion of the duodenum.  Unable to determine if there are any esophageal varices present due to the severity of esophagitis. Biopsies pending  Labs-WBC 15.7/hemoglobin 8/hematocrit 21.7-stable overnight Sodium 122/potassium 3.4/BUN 97/creatinine 4.25 Gastrin pending Blood cultures no growth thus far  OG not on suction    Objective   Vital signs in last 24 hours: Temp:  [97.8 F (36.6 C)-99.2 F (37.3 C)] 99.2 F (37.3 C) (11/18 0808) Pulse Rate:  [76-102] 96 (11/18 0915) Resp:  [14-40] 28 (11/18 0915) BP: (93-139)/(41-62) 133/60 (11/18 0900) SpO2:  [72 %-100 %] 92 % (11/18 0915) Arterial Line BP: (91-151)/(30-73) 123/50 (11/18 0915) FiO2 (%):  [40 %-100 %] 40 % (11/18 0730) Weight:  [94.9 kg] 94.9 kg (11/18 0500) Last BM Date :  (PTA) General:    Critically ill-appearing older white male, intubated, sedated, OG in place Heart:  Regular rate and rhythm; no murmurs Lungs: Respirations even and unlabored, coarse breath sounds bilaterally Abdomen:  Soft, nontender and nondistended.  Bowel sounds are present Extremities:  Without edema.  Multiple ecchymoses, eschars on the top of both great toes Neurologic:  Intubated and sedated Psych: Intubated and sedated t.  Intake/Output from previous day: 11/17 0701 - 11/18 0700 In: 3486.8 [I.V.:1667.6; IV Piggyback:1819.2] Out: 1435 [Urine:1435] Intake/Output this shift: Total I/O In: 426.8 [I.V.:209.2; IV Piggyback:217.5] Out: 175 [Urine:175]  Lab Results: Recent Labs    07/04/24 0420 07/04/24 1316 07/04/24 1748 07/05/24 0428  WBC 15.6*  --  13.3* 15.7*  HGB 9.1* 9.2* 8.7* 8.0*  HCT 24.3* 27.0* 23.7* 21.7*  PLT 60*  --  63* 54*   BMET Recent Labs    07/04/24 1214 07/04/24 1316 07/04/24 1748 07/05/24 0428  NA 123* 121* 122* 122*  K 3.2* 3.2* 3.8 3.4*  CL 79*  --  78* 79*  CO2 25  --  25 22  GLUCOSE 110*  --  115* 123*  BUN 93*  --  95* 97*  CREATININE 4.25*  --  4.40* 4.25*  CALCIUM  7.7*  --  7.7* 7.5*   LFT Recent Labs    07/03/24 1946  PROT 4.8*  ALBUMIN  2.1*  AST 464*  ALT 167*  ALKPHOS 152*  BILITOT 19.2*   PT/INR Recent Labs    07/03/24 0257  LABPROT 24.3*  INR 2.1*    Studies/Results: DG CHEST PORT 1 VIEW Result Date: 07/04/2024 CLINICAL DATA:  Status post central line placement and intubation EXAM: PORTABLE CHEST 1 VIEW COMPARISON:  Chest radiograph dated 07/03/2024 FINDINGS: Lines/tubes: Endotracheal tube tip projects 5.2 cm above the carina. Right internal jugular venous catheter tip projects over the SVC. Lungs: Mildly low lung volumes. Increased bilateral lower lung patchy and linear opacities, left-greater-than-right. Pleura: No pneumothorax or pleural effusion. Heart/mediastinum: The heart  size and mediastinal contours are within normal limits. Bones: No acute osseous abnormality. IMPRESSION: 1. Endotracheal tube tip projects 5.2 cm above the carina. 2. Right internal jugular venous catheter tip projects over the SVC. No pneumothorax. 3. Increased bilateral lower lung patchy and linear opacities, left-greater-than-right, likely atelectasis. Electronically Signed   By: Limin  Xu M.D.   On: 07/04/2024 13:35        Assessment / Plan:    #25  53 year old male with history of chronic EtOH abuse, previously documented cirrhosis, previous withdrawal seizures who was brought to the emergency room after being found down at home-likely down for 12 to 24 hours.  Altered mental status which significantly worsened quickly after admission most consistent with hepatic encephalopathy.  Also met criteria for shock, treating for possible septic shock in combination with posthemorrhagic shock  Blood cultures negative thus far-continues on IV Zosyn  EGD with finding of severe grade D esophagitis and areas of black tissue bordering on acute esophageal necrosis, also with evidence of moderate portal hypertensive gastropathy involving the entire stomach with diffuse inflammation there are also ulcerations in the duodenal bulb and 1st and 2nd portion of the duodenum.  Unable to determine if underlying esophageal varices present due to degree of severe esophagitis.   Fortunately no further evidence of active bleeding-hemoglobin 8 this a.m.  Remains on IV PPI and IV octreotide, and pressor support  #2 hepatic encephalopathy-had been receiving lactulose  enemas, with OG in place okay for oral lactulose  #3 high risk for EtOH withdrawal-covering with phenobarbital  #4 acute kidney injury-did have good output with diuresis last night however no improvement in creatinine-nephrology following.  Possible component of HRS  Plan-as above No changes today from GI perspective, continuing to follow      Principal Problem:   Shock [facility]) Active Problems:   Acute blood loss anemia   Portal hypertensive gastropathy (HCC)   Duodenal ulcer   Acute renal failure     LOS: 2 days   Amy EsterwoodPA-C  07/05/2024, 10:14 AM   Attending physician's note   I have taken a history, reviewed the chart, and examined the patient. I performed a substantive portion of this encounter, including complete performance of at least one of  the key components, in conjunction with the APP. I agree with the APP's note, impression, and recommendations with my edits.   [address], DO, FACG [phone] office

## 2024-07-06 ENCOUNTER — Ambulatory Visit: Payer: Self-pay | Admitting: Gastroenterology

## 2024-07-06 ENCOUNTER — Encounter (HOSPITAL_COMMUNITY): Payer: Self-pay | Admitting: Gastroenterology

## 2024-07-06 ENCOUNTER — Other Ambulatory Visit (HOSPITAL_COMMUNITY): Payer: Self-pay

## 2024-07-06 DIAGNOSIS — K208 Other esophagitis without bleeding: Secondary | ICD-10-CM

## 2024-07-06 DIAGNOSIS — R739 Hyperglycemia, unspecified: Secondary | ICD-10-CM

## 2024-07-06 DIAGNOSIS — K7030 Alcoholic cirrhosis of liver without ascites: Secondary | ICD-10-CM | POA: Diagnosis not present

## 2024-07-06 DIAGNOSIS — R571 Hypovolemic shock: Secondary | ICD-10-CM | POA: Diagnosis not present

## 2024-07-06 DIAGNOSIS — K922 Gastrointestinal hemorrhage, unspecified: Secondary | ICD-10-CM | POA: Diagnosis not present

## 2024-07-06 DIAGNOSIS — G9341 Metabolic encephalopathy: Secondary | ICD-10-CM | POA: Diagnosis not present

## 2024-07-06 DIAGNOSIS — K766 Portal hypertension: Secondary | ICD-10-CM | POA: Diagnosis not present

## 2024-07-06 DIAGNOSIS — K269 Duodenal ulcer, unspecified as acute or chronic, without hemorrhage or perforation: Secondary | ICD-10-CM | POA: Diagnosis not present

## 2024-07-06 DIAGNOSIS — J9601 Acute respiratory failure with hypoxia: Secondary | ICD-10-CM

## 2024-07-06 LAB — CBC
HCT: 20.3 % — ABNORMAL LOW (ref 39.0–52.0)
HCT: 20 % — ABNORMAL LOW (ref 39.0–52.0)
Hemoglobin: 7.5 g/dL — ABNORMAL LOW (ref 13.0–17.0)
Hemoglobin: 7.2 g/dL — ABNORMAL LOW (ref 13.0–17.0)
MCH: 33.8 pg (ref 26.0–34.0)
MCH: 33.5 pg (ref 26.0–34.0)
MCHC: 36.9 g/dL — ABNORMAL HIGH (ref 30.0–36.0)
MCHC: 36 g/dL (ref 30.0–36.0)
MCV: 91.4 fL (ref 80.0–100.0)
MCV: 93 fL (ref 80.0–100.0)
Platelets: 46 K/uL — ABNORMAL LOW (ref 150–400)
Platelets: 52 K/uL — ABNORMAL LOW (ref 150–400)
RBC: 2.22 MIL/uL — ABNORMAL LOW (ref 4.22–5.81)
RBC: 2.15 MIL/uL — ABNORMAL LOW (ref 4.22–5.81)
RDW: 17.8 % — ABNORMAL HIGH (ref 11.5–15.5)
RDW: 18 % — ABNORMAL HIGH (ref 11.5–15.5)
WBC: 16.7 K/uL — ABNORMAL HIGH (ref 4.0–10.5)
WBC: 15.4 K/uL — ABNORMAL HIGH (ref 4.0–10.5)
nRBC: 0.4 % — ABNORMAL HIGH (ref 0.0–0.2)
nRBC: 0.3 % — ABNORMAL HIGH (ref 0.0–0.2)

## 2024-07-06 LAB — HEPATIC FUNCTION PANEL
ALT: 168 U/L — ABNORMAL HIGH (ref 0–44)
AST: 369 U/L — ABNORMAL HIGH (ref 15–41)
Albumin: 3 g/dL — ABNORMAL LOW (ref 3.5–5.0)
Alkaline Phosphatase: 110 U/L (ref 38–126)
Bilirubin, Direct: 13.9 mg/dL — ABNORMAL HIGH (ref 0.0–0.2)
Indirect Bilirubin: 6.9 mg/dL — ABNORMAL HIGH (ref 0.3–0.9)
Total Bilirubin: 20.8 mg/dL (ref 0.0–1.2)
Total Protein: 5.6 g/dL — ABNORMAL LOW (ref 6.5–8.1)

## 2024-07-06 LAB — MAGNESIUM: Magnesium: 2.2 mg/dL (ref 1.7–2.4)

## 2024-07-06 LAB — GLUCOSE, CAPILLARY
Glucose-Capillary: 168 mg/dL — ABNORMAL HIGH (ref 70–99)
Glucose-Capillary: 188 mg/dL — ABNORMAL HIGH (ref 70–99)
Glucose-Capillary: 196 mg/dL — ABNORMAL HIGH (ref 70–99)
Glucose-Capillary: 198 mg/dL — ABNORMAL HIGH (ref 70–99)
Glucose-Capillary: 205 mg/dL — ABNORMAL HIGH (ref 70–99)
Glucose-Capillary: 209 mg/dL — ABNORMAL HIGH (ref 70–99)

## 2024-07-06 LAB — PHOSPHORUS: Phosphorus: 2.2 mg/dL — ABNORMAL LOW (ref 2.5–4.6)

## 2024-07-06 LAB — BASIC METABOLIC PANEL WITH GFR
Anion gap: 22 — ABNORMAL HIGH (ref 5–15)
BUN: 105 mg/dL — ABNORMAL HIGH (ref 6–20)
CO2: 21 mmol/L — ABNORMAL LOW (ref 22–32)
Calcium: 7.1 mg/dL — ABNORMAL LOW (ref 8.9–10.3)
Chloride: 82 mmol/L — ABNORMAL LOW (ref 98–111)
Creatinine, Ser: 4.53 mg/dL — ABNORMAL HIGH (ref 0.61–1.24)
GFR, Estimated: 15 mL/min — ABNORMAL LOW (ref >60)
Glucose, Bld: 199 mg/dL — ABNORMAL HIGH (ref 70–99)
Potassium: 3.2 mmol/L — ABNORMAL LOW (ref 3.5–5.1)
Sodium: 125 mmol/L — ABNORMAL LOW (ref 135–145)

## 2024-07-06 LAB — SURGICAL PATHOLOGY

## 2024-07-06 LAB — PROTIME-INR
INR: 1.9 — ABNORMAL HIGH (ref 0.8–1.2)
Prothrombin Time: 22.9 s — ABNORMAL HIGH (ref 11.4–15.2)

## 2024-07-06 LAB — GASTRIN: Gastrin: 26 pg/mL (ref 0–115)

## 2024-07-06 LAB — AMMONIA: Ammonia: 120 umol/L — ABNORMAL HIGH (ref 9–35)

## 2024-07-06 MED ORDER — CALCIUM GLUCONATE-NACL 2-0.675 GM/100ML-% IV SOLN
2.0000 g | Freq: Once | INTRAVENOUS | Status: AC
  Administered 2024-07-06: 2000 mg via INTRAVENOUS
  Filled 2024-07-06: qty 100

## 2024-07-06 MED ORDER — SODIUM CHLORIDE 0.9% IV SOLUTION: Freq: Once | INTRAVENOUS | Status: AC

## 2024-07-06 MED ORDER — INSULIN ASPART 100 UNIT/ML IJ SOLN
0.0000 [IU] | INTRAMUSCULAR | Status: DC
  Administered 2024-07-06 – 2024-07-08 (×10): 1 [IU] via SUBCUTANEOUS
  Filled 2024-07-06 (×10): qty 1

## 2024-07-06 MED ORDER — POTASSIUM CHLORIDE 10 MEQ/100ML IV SOLN
10.0000 meq | INTRAVENOUS | Status: AC
  Administered 2024-07-06 (×2): 10 meq via INTRAVENOUS
  Filled 2024-07-06 (×2): qty 100

## 2024-07-06 MED ORDER — LACTATED RINGERS IV SOLN: INTRAVENOUS | Status: AC

## 2024-07-06 MED ORDER — LACTULOSE 10 GM/15ML PO SOLN
30.0000 g | Freq: Four times a day (QID) | ORAL | Status: DC
  Administered 2024-07-06 – 2024-07-12 (×24): 30 g
  Filled 2024-07-06 (×24): qty 45

## 2024-07-06 NOTE — Progress Notes (Signed)
 eLink Physician-Brief Progress Note Patient Name: [name] DOB: 1971-07-21 MRN: [id number]   Date of Service  07/06/2024  HPI/Events of Note  Multiple liquid stools, no clear contraindication, coagulopathy of liver disease  eICU Interventions  Fecal management system     Intervention Category Minor Interventions: Routine modifications to care plan (e.g. PRN medications for pain, fever)  [name] 07/06/2024, 6:10 AM

## 2024-07-06 NOTE — Progress Notes (Signed)
 Nephrology Follow-Up Consult note   Assessment/Recommendations: [name] is a/an 53 y.o. male with a past medical history significant for alcohol  dependence, history of withdrawal seizures, liver cirrhosis complicated by varices, schizoaffective disorder who present w/ shock complicated by AKI   Severe Nonoliguric AKI: Baseline creatinine normal.  Creatinine 4.8 on arrival and has improved slightly.  UOP decent at this time despite significant pressor requirement.  Likely has some HRS type physiology as well as ATN at this point -Agree with norepinephrine , albumin , octreotide  -> treating potential HRS -Will give IV fluids for 10 hours today -Continue to monitor urine output closely -Consider CRRT based on progress -> very borderline on whether he would need this.  Would prefer to advance goals of care conversations first before we consider initiating.  Long-term prognosis poor.  Outpatient dialysis candidacy poor. -Continue to monitor daily Cr, Dose meds for GFR -Monitor Daily I/Os, Daily weight  -Maintain MAP>65 for optimal renal perfusion.  -Avoid nephrotoxic medications including NSAIDs -Use synthetic opioids (Fentanyl /Dilaudid ) if needed   Severe shock: Likely septic component compounded on cirrhosis.  Significant lactic acidosis related to shock and liver failure improving.  Continue with supportive care with norepinephrine /vaso.  Antibiotics per primary team as well   GI bleed: On octreotide .  History of varices.  EGD only with friable mucosa.    Altered mental status: Likely multifactorial.  Uremia may be playing some role but likely hepatic encephalopathy a driving factor.  Continue with lactulose . Consioder HD if he fails to improve   Hyponatremia: Unlikely the cause of his altered mental status but likely associated with cirrhosis and AKI.  Continue supportive care.  Continue to monitor 2-3 times daily focusing on supportive care.     Alcoholic cirrhosis: Management per  primary team and GI   Recommendations conveyed to primary service.    [facility] 07/06/2024 9:47 AM  ___________________________________________________________  CC: Found down  Interval History/Subjective: Increased stool output over the past 24 hours.  Norepinephrine  dose decreased.  Remains on vasopressin .  Creatinine slightly higher today.   Medications:  Current Facility-Administered Medications  Medication Dose Route Frequency Provider Last Rate Last Admin   0.9 %  sodium chloride  infusion (Manually program via Guardrails IV Fluids)   Intravenous Once Mesner, Selinda, MD       0.9 %  sodium chloride  infusion (Manually program via Guardrails IV Fluids)   Intravenous Once [name] Doffing P, DO       calcium  gluconate 2 g/ 100 mL sodium chloride  IVPB  2 g Intravenous Once [name] Doffing P, DO       Chlorhexidine  Gluconate Cloth 2 % PADS 6 each  6 each Topical Daily Kara Dorn NOVAK, MD   6 each at 07/06/24 0936   docusate sodium  (COLACE) capsule 100 mg  100 mg Oral BID PRN Kara Dorn NOVAK, MD       feeding supplement (PROSource TF20) liquid 60 mL  60 mL Per Tube Daily Hussein, Abdullahi, MD   60 mL at 07/06/24 0935   feeding supplement (VITAL 1.5 CAL) liquid 1,000 mL  1,000 mL Per Tube Continuous Hussein, Abdullahi, MD 20 mL/hr at 07/06/24 0800 Infusion Verify at 07/06/24 0800   fentaNYL  (SUBLIMAZE ) bolus via infusion 25-100 mcg  25-100 mcg Intravenous Q15 min PRN Sharie Bourbon, MD       fentaNYL  in NS 250mL (13mcg/ml) infusion-PREMIX  0-400 mcg/hr Intravenous Continuous Sharie Bourbon, MD 2.5 mL/hr at 07/06/24 0800 25 mcg/hr at 07/06/24 0800   Gerhardt's  butt cream   Topical BID Sharie Bourbon, MD   Given at 07/06/24 9063   heparin  injection 1,000-6,000 Units  1,000-6,000 Units CRRT PRN Sharie Bourbon, MD   2,400 Units at 07/04/24 1235   insulin  aspart (novoLOG ) injection 0-9 Units  0-9 Units Subcutaneous TID WC Sharie Bourbon, MD   3 Units at 07/06/24 0813   lactated ringers  infusion   Intravenous Continuous Macel Jayson PARAS, MD       lactulose  (CHRONULAC ) 10 GM/15ML solution 30 g  30 g Per Tube TID Sharie Bourbon, MD   30 g at 07/06/24 0935   LORazepam  (ATIVAN ) injection 1-2 mg  1-2 mg Intravenous Q1H PRN Sharie Bourbon, MD       norepinephrine  (LEVOPHED ) 16 mg in (0.064 mg/mL) premix infusion  0-40 mcg/min Intravenous Titrated Millen, Jessica B, RPH 12.19 mL/hr at 07/06/24 0848 13 mcg/min at 07/06/24 0848   octreotide (SANDOSTATIN) 500 mcg in sodium chloride  0.9 % 250 mL (2 mcg/mL) infusion  50 mcg/hr Intravenous Continuous Mesner, Selinda, MD 25 mL/hr at 07/06/24 0800 50 mcg/hr at 07/06/24 0800   Oral care mouth rinse  15 mL Mouth Rinse Q2H Sharie Bourbon, MD   15 mL at 07/06/24 9063   Oral care mouth rinse  15 mL Mouth Rinse PRN Sharie Bourbon, MD       pantoprazole  (PROTONIX ) injection 40 mg  40 mg Intravenous Q12H Kara Dorn NOVAK, MD   40 mg at 07/06/24 0935   piperacillin-tazobactam (ZOSYN) IVPB 3.375 g  3.375 g Intravenous Q8H Hussein, Bourbon, MD 12.5 mL/hr at 07/06/24 0800 Infusion Verify at 07/06/24 0800   polyethylene glycol (MIRALAX  / GLYCOLAX ) packet 17 g  17 g Oral Daily PRN Kara Dorn NOVAK, MD       propofol  (DIPRIVAN ) 1000 MG/100ML infusion  0-80 mcg/kg/min Intravenous Titrated Hussein, Abdullahi, MD 16.88 mL/hr at 07/06/24 0637 30 mcg/kg/min at 07/06/24 9362   rifaximin  (XIFAXAN ) tablet 550 mg  550 mg Per Tube TID Sharie Bourbon, MD   550 mg at 07/06/24 0935   thiamine  (VITAMIN B1) 500 mg in sodium chloride  0.9 % 50 mL IVPB  500 mg Intravenous Daily Sharie Bourbon, MD   Paused at 07/05/24 1047   [START ON 07/09/2024] thiamine  (VITAMIN B1) injection 100 mg  100 mg Intravenous Q24H Sharie Bourbon, MD       vancomycin  variable dose per unstable renal function (pharmacist dosing)   Does not apply See admin instructions Kara Dorn NOVAK, MD        vasopressin  (PITRESSIN) 20 Units in 100 mL (0.2 unit/mL) infusion-*FOR SHOCK*  0-0.03 Units/min Intravenous Continuous Sharie Bourbon, MD 9 mL/hr at 07/06/24 0800 0.03 Units/min at 07/06/24 0800      Review of Systems: Unable to obtain due to the patient's altered mental status/sedation  Physical Exam: Vitals:   07/06/24 0730 07/06/24 0800  BP:  125/70  Pulse: 83 85  Resp: 17 18  Temp:    SpO2: 94% 91%   Total I/O In: 226.5 [I.V.:131.6; Other:30; NG/GT:40; IV Piggyback:24.9] Out: 115 [Urine:40; Stool:75]  Intake/Output Summary (Last 24 hours) at 07/06/2024 0947 Last data filed at 07/06/2024 0800 Gross per 24 hour  Intake 3233.14 ml  Output 1210 ml  Net 2023.14 ml   Constitutional: critically-appearing, no acute distress ENMT: icteric sclera, ears and nose without scars or lesions CV: normal rate, no pitting edema in the bilateral lower extremities, some edema in dependent areas Respiratory: Bilateral breath sounds, bilateral chest rise, frequent coughing, ventilated Gastrointestinal: Moderate distention,  nontender Skin: Jaundice present Psych: Sedated, not interactive   Test Results I personally reviewed new and old clinical labs and radiology tests Lab Results  Component Value Date   NA 125 (L) 07/06/2024   K 3.2 (L) 07/06/2024   CL 82 (L) 07/06/2024   CO2 21 (L) 07/06/2024   BUN 105 (H) 07/06/2024   CREATININE 4.53 (H) 07/06/2024   GFR 93.70 12/20/2020   CALCIUM  7.1 (L) 07/06/2024   ALBUMIN  3.4 (L) 07/05/2024   PHOS 2.2 (L) 07/06/2024    CBC Recent Labs  Lab 07/03/24 0257 07/03/24 0448 07/04/24 1748 07/05/24 0428 07/06/24 0445  WBC 14.1*   < > 13.3* 15.7* 16.7*  NEUTROABS 12.1*  --   --   --   --   HGB 7.6*   < > 8.7* 8.0* 7.5*  HCT 21.2*   < > 23.7* 21.7* 20.3*  MCV 96.4   < > 91.9 90.8 91.4  PLT 130*   < > 63* 54* 46*   < > = values in this interval not displayed.

## 2024-07-06 NOTE — Plan of Care (Signed)
  Problem: Safety: Goal: Non-violent Restraint(s) Outcome: Progressing   

## 2024-07-06 NOTE — Progress Notes (Signed)
 [facility] ROUNDING NOTE   Subjective: Intubated and sedated.  No family at bedside.  Discussed with bedside RN.   Objective: Vital signs in last 24 hours: Temp:  [97.8 F (36.6 C)-99.3 F (37.4 C)] 97.8 F (36.6 C) (11/19 1117) Pulse Rate:  [81-96] 83 (11/19 1108) Resp:  [15-40] 15 (11/19 1108) BP: (87-135)/(49-70) 111/60 (11/19 1100) SpO2:  [89 %-97 %] 95 % (11/19 1109) Arterial Line BP: (105-159)/(38-83) 122/47 (11/19 1100) FiO2 (%):  [40 %-50 %] 40 % (11/19 1109) Weight:  [95.9 kg] 95.9 kg (11/19 0454) Last BM Date : (P) 07/06/24 General: NAD Abdomen:  Soft, ND    Intake/Output from previous day: 11/18 0701 - 11/19 0700 In: 3433.4 [I.V.:1526; NG/GT:276; IV Piggyback:1596.4] Out: 1270 [Urine:1270] Intake/Output this shift: Total I/O In: 750.2 [I.V.:318.2; Other:30; NG/GT:250; IV Piggyback:152] Out: 860 [Urine:160; Stool:700]   Lab Results: Recent Labs    07/04/24 1748 07/05/24 0428 07/06/24 0445  WBC 13.3* 15.7* 16.7*  HGB 8.7* 8.0* 7.5*  PLT 63* 54* 46*  MCV 91.9 90.8 91.4   BMET Recent Labs    07/05/24 0428 07/05/24 1540 07/06/24 0445  NA 122* 124* 125*  K 3.4* 3.4* 3.2*  CL 79* 80* 82*  CO2 22 22 21*  GLUCOSE 123* 154* 199*  BUN 97* 96* 105*  CREATININE 4.25* 4.03* 4.53*  CALCIUM  7.5* 7.4* 7.1*   LFT Recent Labs    07/03/24 1946 07/05/24 1540  PROT 4.8* 5.9*  ALBUMIN  2.1* 3.4*  AST 464* 471*  ALT 167* 176*  ALKPHOS 152* 100  BILITOT 19.2* 20.2*   PT/INR No results for input(s): INR in the last 72 hours.    Imaging/Other results: DG CHEST PORT 1 VIEW Result Date: 07/04/2024 CLINICAL DATA:  Status post central line placement and intubation EXAM: PORTABLE CHEST 1 VIEW COMPARISON:  Chest radiograph dated 07/03/2024 FINDINGS: Lines/tubes: Endotracheal tube tip projects 5.2 cm above the carina. Right internal jugular venous catheter tip projects over the SVC. Lungs: Mildly low lung volumes. Increased bilateral lower  lung patchy and linear opacities, left-greater-than-right. Pleura: No pneumothorax or pleural effusion. Heart/mediastinum: The heart size and mediastinal contours are within normal limits. Bones: No acute osseous abnormality. IMPRESSION: 1. Endotracheal tube tip projects 5.2 cm above the carina. 2. Right internal jugular venous catheter tip projects over the SVC. No pneumothorax. 3. Increased bilateral lower lung patchy and linear opacities, left-greater-than-right, likely atelectasis. Electronically Signed   By: Limin  Xu M.D.   On: 07/04/2024 13:35      Assessment and Plan:  53 year old male with history of chronic EtOH abuse, previously documented cirrhosis, previous withdrawal seizures who was brought to the emergency room after being found down at home-likely down for 12 to 24 hours.  Altered mental status which significantly worsened quickly after admission most consistent with hepatic encephalopathy.  Also met criteria for shock, treating for possible septic shock in combination with posthemorrhagic shock  EGD on 11/17 with severe grade D esophagitis and areas of black tissue bordering on acute esophageal necrosis, also with evidence of moderate portal hypertensive gastropathy involving the entire stomach with diffuse inflammation there are also ulcerations in the duodenal bulb and 1st and 2nd portion of the duodenum. Unable to determine if underlying esophageal varices present due to degree of severe esophagitis.  Gastric and duodenal biopsies with severe inflammatory changes but no Helicobacter pylori.  1) EtOH cirrhosis 2) Severe erosive esophagitis 3) Duodenal ulcers 4) Portal hypertensive gastropathy 5) Acute blood loss anemia H/H downtrending today at 7.5/20.  No overt GI bleed.  Suspect he could have bleeding from multiple sites due to thrombocytopenia and overall platelet dysfunction.  Does have bruising around HD catheter site.  Do not think that repeat endoscopy would provide much  therapeutic benefit as the degree of his esophagitis, gastritis, duodenitis would prohibit most endoscopic therapeutic modalities.  Largely supportive care from an anemia standpoint. - PRBC transfusions - PLTs - Continue serial CBC checks - Continue high-dose IV PPI - Continue octreotide for the time being.  Does have a history of esophageal varices, but given severity of erosive esophagitis, cannot rule out presence of varices currently  6) Severe nonoliguric AKI Concern for potential overlapping HRS.  Creatinine up to 4.5 today with BUN 105. - Management per consulting Nephrology service.  Considering CRRT  7) Shock physiology - Currently on vasopressin  and norepinephrine  - Management per primary CCS  8) Encephalopathy Likely multifactorial with metabolic encephalopathy from potential underlying infection along with hepatic encephalopathy - Lactulose  via OGT - Antimicrobial therapy per primary CCS  Recommend GOC conversation with family members   Ryan LULLA Flatter, DO  07/06/2024, 11:39 AM [facility] Pager [phone]

## 2024-07-06 NOTE — Progress Notes (Signed)
 eLink Physician-Brief Progress Note Patient Name: [name] DOB: [birth year] MRN: [id number]   Date of Service  07/06/2024  HPI/Events of Note  Patient needs Q 4 hour CBG with SSI coverage. Patient having brisk oozing around HD catheter with platelet count of 52 K.  eICU Interventions  CBG + SSI ordered, one unit of platelets ordered transfused.        Yona Stansbury U Xavion Muscat 07/06/2024, 9:55 PM

## 2024-07-06 NOTE — Plan of Care (Signed)
  Problem: Education: Goal: Knowledge of General Education information will improve Description: Including pain rating scale, medication(s)/side effects and non-pharmacologic comfort measures Outcome: Progressing   Problem: Activity: Goal: Risk for activity intolerance will decrease Outcome: Progressing   Problem: Elimination: Goal: Will not experience complications related to bowel motility Outcome: Progressing Goal: Will not experience complications related to urinary retention Outcome: Progressing   Problem: Safety: Goal: Ability to remain free from injury will improve Outcome: Progressing   Problem: Education: Goal: Ability to describe self-care measures that may prevent or decrease complications (Diabetes Survival Skills Education) will improve Outcome: Progressing

## 2024-07-06 NOTE — Progress Notes (Signed)
 NAME:  [name], MRN:  [id number], DOB:  [birth year], LOS: 3 ADMISSION DATE:  07/03/2024, CONSULTATION DATE:  07/03/24 REFERRING MD:  EDP CHIEF COMPLAINT:  Shock   History of Present Illness:  [name] is a 53 year old male, daily smoker with history of alcohol  dependence, withdrawal seizures, liver cirrhosis, PTSD, and schizoaffective disorder who is brought in by EMS after falling and being found on the floor covered in urine and feces.   In the ER, vitals showed temp of 94.102F, BP 74/39, RR 25, Spo2 94% on 4L O2.   Labs show Na 126, K 3.1, Cl <65, Serum bicarb 17, Glucose 61, Cr 4.83 (baseline 0.79), Alk phos 171, albumin  2.1, AST/ALT 384/134, Ammonia 77, T. Bili 15.5, CK 285, Trop 25. CBC shows WBC 14, Hgb 7.6g/dL, plts 869. Lactic acid >15.  He reports throwing up dark blood over the past 2 days. He has epigastric discomfort. He is alert to person and time but not place.   CT Head without acute abnormality. CT C-spine without acute abnormality. Chest radiograph unremarkable.  PCCM called for admission.   Pertinent  Medical History   Past Medical History:  Diagnosis Date   Alcohol  abuse    Anxiety    at age 47   Blood transfusion without reported diagnosis    Cirrhosis (HCC)    Depression    at age 76   Epileptic seizure (HCC)    Not associated with EtOH withdrawal   GERD (gastroesophageal reflux disease)    Hypertension    Nausea and vomiting    Psychosis (HCC)    PTSD (post-traumatic stress disorder)    Schizoaffective disorder    Seizure disorder (HCC)    related to etoh seizure   Symptomatic anemia    Significant Hospital Events: Including procedures, antibiotic start and stop dates in addition to other pertinent events   11/16 admitted to ICU for hemorrhagic shock with UGIB, cirrhosis 11/17 EGD with severe esophagitis   Interim History / Subjective:  Overnight no acute events.  Tmax 99.3.  Remains on octreotide, propofol , fentanyl , vaso, NE. RN  turned off sedation this morning.   Objective    Blood pressure 125/70, pulse 85, temperature 99.3 F (37.4 C), temperature source Axillary, resp. rate 18, height 6' (1.829 m), weight 95.9 kg, SpO2 91%.    Vent Mode: PSV;CPAP FiO2 (%):  [40 %-50 %] 40 % Set Rate:  [20 bmp] 20 bmp Vt Set:  [620 mL] 620 mL PEEP:  [5 cmH20] 5 cmH20 Pressure Support:  [10 cmH20] 10 cmH20 Plateau Pressure:  [19 cmH20-27 cmH20] 27 cmH20   Intake/Output Summary (Last 24 hours) at 07/06/2024 0926 Last data filed at 07/06/2024 0800 Gross per 24 hour  Intake 3233.14 ml  Output 1210 ml  Net 2023.14 ml   Filed Weights   07/04/24 0400 07/05/24 0500 07/06/24 0454  Weight: 93.8 kg 94.9 kg 95.9 kg    Examination: General: critically ill appearing man lying in bed in NAD HEENT: [place]/AT, icteric sclera, ETT Lungs: breathing comfortably on MV, CTAB CVS:  S1S2, RRR Abdomen: soft, NT, ND Extremities: +edema Neuro: RASS -5, pinpoint pupils  I/O +100cc, net +12L for admission UOP 1270  Na+ 125 K+ 3.2 BUN 105 Cr 4.53 Phos 2.2 Ca+ 7.1 WBC 16.7 H/H 7.5/20.3 Platelets 46  Urine culture NG Blood cultures NGTD   Resolved problem list   Assessment and Plan   Hemorrhagic shock due to acute UGIB with severe esophagitis, moderate portal HTN gastropathy, duodenal  ulcerations. Cannot r/o varices, but none seen.  H/o ETOH cirrhosis with thrombocytopenia, MELDNA 30 -PPI -octreotide  -stop steroids -serial CBCs; transfuse for Hb <7 or hemodynamically significant bleeding -1 unit platelets today; goal platelets >50K unless actively bleeding -appreciate  GI's management -recheck LFTs today, INR, ammonia  Ongoing shock- suspect this is mixed etiology with sedation, cirrhosis> No obvious source of sepsis. -stop SDS with very high GIB risk already -con't NE and vasopressin  as needed to maintain MAP >85 (for HRS possibility) -con't broad antibiotics  Acute metabolic encephalopathy due to HE  precipitated by UGIB, now with severe azotemia and AKI H/o schizoaffective disorder -would prefer to get off fentanyl  with liver disease history; if needing to restart an opiate, will start dilaudid  -PAD protocol- prefer propofol  with short half life -support renal failure -broad antibiotics- stop vanc, con't zosyn  -lactulose  and rifaximin   Acute respiratory failure with hypoxia requiring MV -LTVV -VAP prevention protocol -PAD protocol- goal RASS 0 to -1. Stopping fentanyl . Ok to restart propofol  if he wakes up/ needs it. Would start dilaudid  rather than fent.  H/o ETOH abuse -while on propofol  can stop phenobarb; probably getting out of window for ETOH w/d -can restart CIWA once extubated  AKI on CKD with oliguria Hypervolemic hyponatremia Possible HRS -goal MAP >85 -strict I/O -renally dose meds, avoid nephrotoxic meds  Hypokalemia -replete cautiously  Hypocalcemia Hypophosphatemia -replete, monitor  Hyperglycemia -SSI PRN -not scheduling insulin  due to stopping steroids today -goal BG 140-180  At risk for malnutrition -TF   Labs   CBC: Recent Labs  Lab 07/03/24 0257 07/03/24 0448 07/03/24 1946 07/04/24 0420 07/04/24 1316 07/04/24 1748 07/05/24 0428 07/06/24 0445  WBC 14.1*   < > 14.3* 15.6*  --  13.3* 15.7* 16.7*  NEUTROABS 12.1*  --   --   --   --   --   --   --   HGB 7.6*   < > 9.8* 9.1* 9.2* 8.7* 8.0* 7.5*  HCT 21.2*   < > 27.5* 24.3* 27.0* 23.7* 21.7* 20.3*  MCV 96.4   < > 94.8 91.0  --  91.9 90.8 91.4  PLT 130*   < > PLATELET CLUMPS NOTED ON SMEAR, UNABLE TO ESTIMATE 60*  --  63* 54* 46*   < > = values in this interval not displayed.    Basic Metabolic Panel: Recent Labs  Lab 07/03/24 0257 07/03/24 0448 07/03/24 0640 07/04/24 0420 07/04/24 1214 07/04/24 1316 07/04/24 1748 07/05/24 0428 07/05/24 1218 07/05/24 1540 07/06/24 0445  NA 126*  --    < > 123* 123* 121* 122* 122*  --  124* 125*  K 3.1*  --    < > 3.4* 3.2* 3.2* 3.8 3.4*  --   3.4* 3.2*  CL <65*  --    < > 77* 79*  --  78* 79*  --  80* 82*  CO2 17*  --    < > 25 25  --  25 22  --  22 21*  GLUCOSE 61*  --    < > 149* 110*  --  115* 123*  --  154* 199*  BUN 79*  --    < > 90* 93*  --  95* 97*  --  96* 105*  CREATININE 4.83*  --    < > 4.40* 4.25*  --  4.40* 4.25*  --  4.03* 4.53*  CALCIUM  8.1*  --    < > 7.8* 7.7*  --  7.7* 7.5*  --  7.4* 7.1*  MG 1.7 1.5*  --  2.4  --   --   --   --  1.9  --  2.2  PHOS  --  7.5*  --   --   --   --   --   --  1.7*  --  2.2*   < > = values in this interval not displayed.   GFR: Estimated Creatinine Clearance: 23.2 mL/min (A) (by C-G formula based on SCr of 4.53 mg/dL (H)). Recent Labs  Lab 07/03/24 1434 07/03/24 1946 07/04/24 0420 07/04/24 0744 07/04/24 1312 07/04/24 1748 07/04/24 2201 07/05/24 0428 07/06/24 0445  WBC  --    < > 15.6*  --   --  13.3*  --  15.7* 16.7*  LATICACIDVEN >9.0*  --   --  2.5* 2.1*  --  2.6*  --   --    < > = values in this interval not displayed.    Liver Function Tests: Recent Labs  Lab 07/03/24 0257 07/03/24 1946 07/05/24 1540  AST 384* 464* 471*  ALT 134* 167* 176*  ALKPHOS 171* 152* 100  BILITOT 15.5* 19.2* 20.2*  PROT 4.9* 4.8* 5.9*  ALBUMIN  2.1* 2.1* 3.4*   No results for input(s): LIPASE, AMYLASE in the last 168 hours. Recent Labs  Lab 07/03/24 0257 07/04/24 0420 07/05/24 1218  AMMONIA 77* 88* 93*     Critical care time:     This patient is critically ill with multiple organ system failure which requires frequent high complexity decision making, assessment, support, evaluation, and titration of therapies. This was completed through the application of advanced monitoring technologies and extensive interpretation of multiple databases. During this encounter critical care time was devoted to patient care services described in this note for 36 minutes.  Ryan SHAUNNA Gaskins, DO 07/06/24 10:53 AM [facility]  For contact information, see Amion. If no  response to pager, please call PCCM consult pager. After hours, 7PM- 7AM, please call Elink.

## 2024-07-07 ENCOUNTER — Inpatient Hospital Stay (HOSPITAL_COMMUNITY)

## 2024-07-07 DIAGNOSIS — K922 Gastrointestinal hemorrhage, unspecified: Secondary | ICD-10-CM | POA: Diagnosis not present

## 2024-07-07 DIAGNOSIS — K7030 Alcoholic cirrhosis of liver without ascites: Secondary | ICD-10-CM | POA: Diagnosis not present

## 2024-07-07 DIAGNOSIS — G9341 Metabolic encephalopathy: Secondary | ICD-10-CM | POA: Diagnosis not present

## 2024-07-07 DIAGNOSIS — R571 Hypovolemic shock: Secondary | ICD-10-CM | POA: Diagnosis not present

## 2024-07-07 LAB — BPAM RBC
Blood Product Expiration Date: 202512122359
Blood Product Expiration Date: 202512122359
ISSUE DATE / TIME: 202511160559
ISSUE DATE / TIME: 202511191145
Unit Type and Rh: 5100
Unit Type and Rh: 5100

## 2024-07-07 LAB — PREPARE PLATELET PHERESIS
Unit division: 0
Unit division: 0

## 2024-07-07 LAB — BPAM PLATELET PHERESIS
Blood Product Expiration Date: 202511212359
Blood Product Expiration Date: 202511212359
ISSUE DATE / TIME: 202511191205
ISSUE DATE / TIME: 202511192213
Unit Type and Rh: 7300
Unit Type and Rh: 7300

## 2024-07-07 LAB — RENAL FUNCTION PANEL
Albumin: 2.7 g/dL — ABNORMAL LOW (ref 3.5–5.0)
Anion gap: 19 — ABNORMAL HIGH (ref 5–15)
BUN: 119 mg/dL — ABNORMAL HIGH (ref 6–20)
CO2: 23 mmol/L (ref 22–32)
Calcium: 7.4 mg/dL — ABNORMAL LOW (ref 8.9–10.3)
Chloride: 85 mmol/L — ABNORMAL LOW (ref 98–111)
Creatinine, Ser: 4.38 mg/dL — ABNORMAL HIGH (ref 0.61–1.24)
GFR, Estimated: 15 mL/min — ABNORMAL LOW (ref >60)
Glucose, Bld: 173 mg/dL — ABNORMAL HIGH (ref 70–99)
Phosphorus: 2 mg/dL — ABNORMAL LOW (ref 2.5–4.6)
Potassium: 2.8 mmol/L — ABNORMAL LOW (ref 3.5–5.1)
Sodium: 127 mmol/L — ABNORMAL LOW (ref 135–145)

## 2024-07-07 LAB — PHOSPHORUS: Phosphorus: 2 mg/dL — ABNORMAL LOW (ref 2.5–4.6)

## 2024-07-07 LAB — TYPE AND SCREEN
ABO/RH(D): O POS
Antibody Screen: NEGATIVE
Unit division: 0
Unit division: 0

## 2024-07-07 LAB — CBC
HCT: 20.3 % — ABNORMAL LOW (ref 39.0–52.0)
Hemoglobin: 7.3 g/dL — ABNORMAL LOW (ref 13.0–17.0)
MCH: 33.3 pg (ref 26.0–34.0)
MCHC: 36 g/dL (ref 30.0–36.0)
MCV: 92.7 fL (ref 80.0–100.0)
Platelets: 61 K/uL — ABNORMAL LOW (ref 150–400)
RBC: 2.19 MIL/uL — ABNORMAL LOW (ref 4.22–5.81)
RDW: 17.9 % — ABNORMAL HIGH (ref 11.5–15.5)
WBC: 15.5 K/uL — ABNORMAL HIGH (ref 4.0–10.5)
nRBC: 0.3 % — ABNORMAL HIGH (ref 0.0–0.2)

## 2024-07-07 LAB — GLUCOSE, CAPILLARY
Glucose-Capillary: 164 mg/dL — ABNORMAL HIGH (ref 70–99)
Glucose-Capillary: 168 mg/dL — ABNORMAL HIGH (ref 70–99)
Glucose-Capillary: 176 mg/dL — ABNORMAL HIGH (ref 70–99)
Glucose-Capillary: 176 mg/dL — ABNORMAL HIGH (ref 70–99)
Glucose-Capillary: 180 mg/dL — ABNORMAL HIGH (ref 70–99)
Glucose-Capillary: 182 mg/dL — ABNORMAL HIGH (ref 70–99)

## 2024-07-07 LAB — BASIC METABOLIC PANEL WITH GFR
Anion gap: 20 — ABNORMAL HIGH (ref 5–15)
BUN: 119 mg/dL — ABNORMAL HIGH (ref 6–20)
CO2: 22 mmol/L (ref 22–32)
Calcium: 7.4 mg/dL — ABNORMAL LOW (ref 8.9–10.3)
Chloride: 86 mmol/L — ABNORMAL LOW (ref 98–111)
Creatinine, Ser: 4.43 mg/dL — ABNORMAL HIGH (ref 0.61–1.24)
GFR, Estimated: 15 mL/min — ABNORMAL LOW (ref >60)
Glucose, Bld: 169 mg/dL — ABNORMAL HIGH (ref 70–99)
Potassium: 3.4 mmol/L — ABNORMAL LOW (ref 3.5–5.1)
Sodium: 128 mmol/L — ABNORMAL LOW (ref 135–145)

## 2024-07-07 LAB — MAGNESIUM: Magnesium: 2.2 mg/dL (ref 1.7–2.4)

## 2024-07-07 LAB — AMMONIA: Ammonia: 88 umol/L — ABNORMAL HIGH (ref 9–35)

## 2024-07-07 MED ORDER — POTASSIUM CHLORIDE 10 MEQ/100ML IV SOLN
10.0000 meq | INTRAVENOUS | Status: DC
  Administered 2024-07-07: 10 meq via INTRAVENOUS
  Filled 2024-07-07 (×2): qty 100

## 2024-07-07 MED ORDER — POTASSIUM & SODIUM PHOSPHATES 280-160-250 MG PO PACK
2.0000 | PACK | ORAL | Status: AC
  Administered 2024-07-07 (×2): 2
  Filled 2024-07-07 (×2): qty 2

## 2024-07-07 MED ORDER — POTASSIUM CHLORIDE 20 MEQ PO PACK
20.0000 meq | PACK | Freq: Once | ORAL | Status: AC
  Administered 2024-07-07: 20 meq
  Filled 2024-07-07: qty 1

## 2024-07-07 MED ORDER — POTASSIUM CHLORIDE 10 MEQ/100ML IV SOLN
10.0000 meq | INTRAVENOUS | Status: AC
  Administered 2024-07-07 (×4): 10 meq via INTRAVENOUS
  Filled 2024-07-07 (×3): qty 100

## 2024-07-07 NOTE — Plan of Care (Signed)
  Problem: Education: Goal: Knowledge of General Education information will improve Description: Including pain rating scale, medication(s)/side effects and non-pharmacologic comfort measures Outcome: Not Progressing   Problem: Clinical Measurements: Goal: Diagnostic test results will improve Outcome: Progressing Goal: Respiratory complications will improve Outcome: Progressing Goal: Cardiovascular complication will be avoided Outcome: Progressing   Problem: Activity: Goal: Risk for activity intolerance will decrease Outcome: Not Progressing   Problem: Nutrition: Goal: Adequate nutrition will be maintained Outcome: Progressing

## 2024-07-07 NOTE — Progress Notes (Signed)
 NAME:  [name], MRN:  [id number], DOB:  [birth year], LOS: 4 ADMISSION DATE:  07/03/2024, CONSULTATION DATE:  07/03/24 REFERRING MD:  EDP CHIEF COMPLAINT:  Shock   History of Present Illness:  [name] is a 53 year old male, daily smoker with history of alcohol  dependence, withdrawal seizures, liver cirrhosis, PTSD, and schizoaffective disorder who is brought in by EMS after falling and being found on the floor covered in urine and feces.   In the ER, vitals showed temp of 94.40F, BP 74/39, RR 25, Spo2 94% on 4L O2.   Labs show Na 126, K 3.1, Cl <65, Serum bicarb 17, Glucose 61, Cr 4.83 (baseline 0.79), Alk phos 171, albumin  2.1, AST/ALT 384/134, Ammonia 77, T. Bili 15.5, CK 285, Trop 25. CBC shows WBC 14, Hgb 7.6g/dL, plts 869. Lactic acid >15.  He reports throwing up dark blood over the past 2 days. He has epigastric discomfort. He is alert to person and time but not place.   CT Head without acute abnormality. CT C-spine without acute abnormality. Chest radiograph unremarkable.  PCCM called for admission.   Pertinent  Medical History   Past Medical History:  Diagnosis Date   Alcohol  abuse    Anxiety    at age 27   Blood transfusion without reported diagnosis    Cirrhosis (HCC)    Depression    at age 53   Epileptic seizure (HCC)    Not associated with EtOH withdrawal   GERD (gastroesophageal reflux disease)    Hypertension    Nausea and vomiting    Psychosis (HCC)    PTSD (post-traumatic stress disorder)    Schizoaffective disorder    Seizure disorder (HCC)    related to etoh seizure   Symptomatic anemia    Significant Hospital Events: Including procedures, antibiotic start and stop dates in addition to other pertinent events   11/16 admitted to ICU for hemorrhagic shock with UGIB, cirrhosis 11/17 EGD with severe esophagitis 11/19 woke up agitated, not directable  Interim History / Subjective:  This morning on mild decrease in sedation he had agitation  that required re-sedation- kicking his legs out of the bed. Not redirectable.  Remains on propofol , NE, vaso, octreotide .   Objective    Blood pressure 136/65, pulse 89, temperature 98.1 F (36.7 C), temperature source Axillary, resp. rate 19, height 6' (1.829 m), weight 96.9 kg, SpO2 93%.    Vent Mode: PSV;CPAP FiO2 (%):  [40 %] 40 % Set Rate:  [20 bmp] 20 bmp Vt Set:  [379 mL] 620 mL PEEP:  [5 cmH20] 5 cmH20 Pressure Support:  [5 cmH20-8 cmH20] 5 cmH20   Intake/Output Summary (Last 24 hours) at 07/07/2024 0900 Last data filed at 07/07/2024 0900 Gross per 24 hour  Intake 4848.95 ml  Output 2335 ml  Net 2513.95 ml   Filed Weights   07/05/24 0500 07/06/24 0454 07/07/24 0500  Weight: 94.9 kg 95.9 kg 96.9 kg    Examination: General:  critically ill appearing man lying in bed in NAD HEENT: [place]/AT,  icteric sclera, ETT Lungs: synchronous with MV, CTAB CVS:  S1S2, RRR Abdomen: soft, NT, ND Extremities: no cyanosis or edema Neuro:  RASS -5  I/O +2.3L, net +13.9L for admission UOP 600  Na+ 127 K+ 2.8 BUN 119 Cr 4.38 Phos 2.0 Ca+ 7.4 WBC 15.5 H/H 7.3/20.3 Platelets 61  Urine culture NGTD Blood cultures NGTD   Resolved problem list   Assessment and Plan   Hemorrhagic shock due to acute UGIB  with severe esophagitis, moderate portal HTN gastropathy, duodenal ulcerations. Cannot r/o varices, but none seen.  H/o ETOH cirrhosis with thrombocytopenia, MELDNA 39 (a/w ~50% mortality at 6 months) -con't PPI -3 days of octreotide  -serial CBCs -transfuse for Hb <7 or hemodynamically significant bleeding -goal platelets>50 -daily LFTs, INR -appreciate [facility] management -recheck LFTs today, INR, ammonia  Ongoing shock- suspect this is mixed etiology with sedation, cirrhosis> No obvious source of sepsis.  -off SDS -weaning pressors but MAP goal >85 for possible HRS -con't antibiotics  Acute metabolic encephalopathy due to HE precipitated by UGIB, now with severe  azotemia and AKI H/o schizoaffective disorder -propofol , avoid fentanyl  -PAD protocol -supporting renal failure -lactulose  & rifaximin   Acute respiratory failure with hypoxia requiring MV -LTVV -VAP-prevention protocol -PAD protocol for sedation -daily SAT & SBT as appropriate  H/o ETOH abuse -can start CIWA once not on propofol   AKI on CKD with oliguria Hypervolemic hyponatremia Possible HRS -goal MAP >85-- NE & vaso -strict I/O -renally dose meds, avoid nephrotoxic meds -discussed with daughter possible RRT, but worry about his long-term recovery potential  Hypokalemia, due to stool output -K+ repletion  Hypocalcemia Hypophosphatemia -repleted  Hyperglycemia -SSI PRN -goal BG 140-180  At risk for malnutrition -con't TF  See ipal note for GOC discussions. Long term prognosis guarded.   Labs   CBC: Recent Labs  Lab 07/03/24 0257 07/03/24 0448 07/04/24 1748 07/05/24 0428 07/06/24 0445 07/06/24 1656 07/07/24 0415  WBC 14.1*   < > 13.3* 15.7* 16.7* 15.4* 15.5*  NEUTROABS 12.1*  --   --   --   --   --   --   HGB 7.6*   < > 8.7* 8.0* 7.5* 7.2* 7.3*  HCT 21.2*   < > 23.7* 21.7* 20.3* 20.0* 20.3*  MCV 96.4   < > 91.9 90.8 91.4 93.0 92.7  PLT 130*   < > 63* 54* 46* 52* 61*   < > = values in this interval not displayed.    Basic Metabolic Panel: Recent Labs  Lab 07/03/24 0448 07/03/24 0640 07/04/24 0420 07/04/24 1214 07/04/24 1748 07/05/24 0428 07/05/24 1218 07/05/24 1540 07/06/24 0445 07/07/24 0415  NA  --    < > 123*   < > 122* 122*  --  124* 125* 127*  K  --    < > 3.4*   < > 3.8 3.4*  --  3.4* 3.2* 2.8*  CL  --    < > 77*   < > 78* 79*  --  80* 82* 85*  CO2  --    < > 25   < > 25 22  --  22 21* 23  GLUCOSE  --    < > 149*   < > 115* 123*  --  154* 199* 173*  BUN  --    < > 90*   < > 95* 97*  --  96* 105* 119*  CREATININE  --    < > 4.40*   < > 4.40* 4.25*  --  4.03* 4.53* 4.38*  CALCIUM   --    < > 7.8*   < > 7.7* 7.5*  --  7.4* 7.1* 7.4*   MG 1.5*  --  2.4  --   --   --  1.9  --  2.2 2.2  PHOS 7.5*  --   --   --   --   --  1.7*  --  2.2* 2.0*  2.0*   < > =  values in this interval not displayed.   GFR: Estimated Creatinine Clearance: 24.1 mL/min (A) (by C-G formula based on SCr of 4.38 mg/dL (H)). Recent Labs  Lab 07/03/24 1434 07/03/24 1946 07/04/24 0744 07/04/24 1312 07/04/24 1748 07/04/24 2201 07/05/24 0428 07/06/24 0445 07/06/24 1656 07/07/24 0415  WBC  --    < >  --   --    < >  --  15.7* 16.7* 15.4* 15.5*  LATICACIDVEN >9.0*  --  2.5* 2.1*  --  2.6*  --   --   --   --    < > = values in this interval not displayed.    Liver Function Tests: Recent Labs  Lab 07/03/24 0257 07/03/24 1946 07/05/24 1540 07/06/24 1031 07/07/24 0415  AST 384* 464* 471* 369*  --   ALT 134* 167* 176* 168*  --   ALKPHOS 171* 152* 100 110  --   BILITOT 15.5* 19.2* 20.2* 20.8*  --   PROT 4.9* 4.8* 5.9* 5.6*  --   ALBUMIN  2.1* 2.1* 3.4* 3.0* 2.7*   No results for input(s): LIPASE, AMYLASE in the last 168 hours. Recent Labs  Lab 07/03/24 0257 07/04/24 0420 07/05/24 1218 07/06/24 1454 07/07/24 0415  AMMONIA 77* 88* 93* 120* 88*     Critical care time:     This patient is critically ill with multiple organ system failure which requires frequent high complexity decision making, assessment, support, evaluation, and titration of therapies. This was completed through the application of advanced monitoring technologies and extensive interpretation of multiple databases. During this encounter critical care time was devoted to patient care services described in this note for 45 minutes.  [name], DO 07/07/24 10:28 AM [facility]  For contact information, see Amion. If no response to pager, please call PCCM consult pager. After hours, 7PM- 7AM, please call Elink.

## 2024-07-07 NOTE — IPAL (Signed)
  Interdisciplinary Goals of Care Family Meeting   Date carried out:: 07/07/2024  Location of the meeting: Phone conference  Member's involved: Physician and Family Member or next of kin  Durable Power of Attorney or acting medical decision maker: daughter   [name]  Discussion: We discussed goals of care for [name] .  I updated her on his hospitalization and previous hospitalizations this year-- 3 for ETOH w/d seizures. He has a MELD score predicting ~50% mortality in the next 3 months. We reviewed his MOF and my concern that if he survives this hospitalization it would be weeks to months of recovery. With his ongoing ETOH use this year, I think he would go home, start drinking again, and quickly end up back here, or he would not be able to recover enough to live independently and potentially require SNF. His daughter is already concerned about his QOL and thinks it would be worse after a prolonged hospitalization. She will talk to family but does not think pursuing RRT at this point is needed. I recommended we have a meeting with family tomorrow to discuss further. She does not know if he has an advanced directive. She is an only child; he is not married.   Code status: Full Code- not discussed today  Disposition: Continue current acute care   Time spent for the meeting: 20 min.  [name] 07/07/2024, 10:13 AM

## 2024-07-07 NOTE — Progress Notes (Signed)
 Nephrology Follow-Up Consult note   Assessment/Recommendations: [name] is a/an 53 y.o. male with a past medical history significant for alcohol  dependence, history of withdrawal seizures, liver cirrhosis complicated by varices, schizoaffective disorder who present w/ shock complicated by AKI   Severe Nonoliguric AKI: Baseline creatinine normal.  Creatinine 4.8 on arrival likely some HRS physiology but also ATN at this point - Has been receiving treatment for HRS with octreotide, albumin , norepinephrine  - No further IV fluids -Continue to monitor urine output closely - Based on family conversations no plans for dialysis right now -Continue with supportive care -Continue to monitor daily Cr, Dose meds for GFR -Monitor Daily I/Os, Daily weight  -Maintain MAP>65 for optimal renal perfusion.  -Avoid nephrotoxic medications including NSAIDs -Use synthetic opioids (Fentanyl /Dilaudid ) if needed   Severe shock: Likely septic component compounded on cirrhosis.  Continue with antibiotics and pressors per primary team   GI bleed: History of varices EGD only with friable mucosa.  Continue treatment per primary team   Altered mental status: Likely multifactorial.  Uremia may be playing some role but likely hepatic encephalopathy a driving factor.  Continue with lactulose .  Holding off on renal replacement therapy for now   Hyponatremia: Unlikely the cause of his altered mental status but likely associated with cirrhosis and AKI.  Continue to monitor 2-3 times daily focusing on supportive care.  Hypokalemia: Continue with replacement per IV     Alcoholic cirrhosis: Management per primary team and GI  Goals of care: I discussed clinical status with Sister [name].  Dr. [name] discussed with daughter.  Planning for family meeting.  Likely leaning towards comfort care   Recommendations conveyed to primary service.    [name] 07/07/2024 10:18  AM  ___________________________________________________________  CC: Found down  Interval History/Subjective: Patient remains critically ill on norepi and vaso. Despite hydration his UOP diminished and BUN is rising.  Discussed care with his sister [name] and she understands he may be dying. She doesn't want him to suffer.  Medications:  Current Facility-Administered Medications  Medication Dose Route Frequency Provider Last Rate Last Admin   Chlorhexidine  Gluconate Cloth 2 % PADS 6 each  6 each Topical Daily Kara Dorn NOVAK, MD   6 each at 07/06/24 0936   docusate sodium  (COLACE) capsule 100 mg  100 mg Oral BID PRN Kara Dorn NOVAK, MD       feeding supplement (PROSource TF20) liquid 60 mL  60 mL Per Tube Daily Sharie Bourbon, MD   60 mL at 07/07/24 9076   feeding supplement (VITAL 1.5 CAL) liquid 1,000 mL  1,000 mL Per Tube Continuous Hussein, Abdullahi, MD 20 mL/hr at 07/07/24 1000 Infusion Verify at 07/07/24 1000   Gerhardt's butt cream   Topical BID Sharie Bourbon, MD   Given at 07/07/24 9077   insulin  aspart (novoLOG ) injection 0-6 Units  0-6 Units Subcutaneous Q4H Ogan, [place]on[place]wo U, MD   1 Units at 07/07/24 0735   lactulose  (CHRONULAC ) 10 GM/15ML solution 30 g  30 g Per Tube Q6H [name] Doffing P, DO   30 g at 07/07/24 9076   LORazepam  (ATIVAN ) injection 1-2 mg  1-2 mg Intravenous Q1H PRN Sharie Bourbon, MD       norepinephrine  (LEVOPHED ) 16 mg in (0.064 mg/mL) premix infusion  0-40 mcg/min Intravenous Titrated [name] Doffing P, DO 24.4 mL/hr at 07/07/24 1000 26 mcg/min at 07/07/24 1000   octreotide (SANDOSTATIN) 500 mcg in sodium chloride  0.9 % 250 mL (2 mcg/mL) infusion  50 mcg/hr Intravenous Continuous Mesner, Selinda, MD 25 mL/hr at 07/07/24 1000 50 mcg/hr at 07/07/24 1000   Oral care mouth rinse  15 mL Mouth Rinse Q2H Sharie Bourbon, MD   15 mL at 07/07/24 9078   Oral care mouth rinse  15 mL Mouth Rinse PRN Sharie Bourbon, MD       pantoprazole  (PROTONIX )  injection 40 mg  40 mg Intravenous Q12H Kara Dorn NOVAK, MD   40 mg at 07/07/24 0914   piperacillin-tazobactam (ZOSYN) IVPB 3.375 g  3.375 g Intravenous Q8H Hussein, Abdullahi, MD   Stopped at 07/07/24 0935   polyethylene glycol (MIRALAX  / GLYCOLAX ) packet 17 g  17 g Oral Daily PRN Kara Dorn NOVAK, MD       potassium chloride  10 mEq in 100 mL IVPB  10 mEq Intravenous Q1 Hr x 4 Macel Jayson PARAS, MD 100 mL/hr at 07/07/24 1000 Infusion Verify at 07/07/24 1000   propofol  (DIPRIVAN ) 1000 MG/100ML infusion  0-80 mcg/kg/min Intravenous Titrated Sharie Bourbon, MD 8.44 mL/hr at 07/07/24 0849 15 mcg/kg/min at 07/07/24 0849   rifaximin  (XIFAXAN ) tablet 550 mg  550 mg Per Tube TID Sharie Bourbon, MD   550 mg at 07/07/24 9076   thiamine  (VITAMIN B1) 500 mg in sodium chloride  0.9 % 50 mL IVPB  500 mg Intravenous Daily Sharie Bourbon, MD 110 mL/hr at 07/07/24 1000 Infusion Verify at 07/07/24 1000   [START ON 07/09/2024] thiamine  (VITAMIN B1) injection 100 mg  100 mg Intravenous Q24H Sharie Bourbon, MD       vasopressin  (PITRESSIN) 20 Units in 100 mL (0.2 unit/mL) infusion-*FOR SHOCK*  0-0.03 Units/min Intravenous Continuous Sharie Bourbon, MD 9 mL/hr at 07/07/24 1000 0.03 Units/min at 07/07/24 1000      Review of Systems: Unable to obtain due to the patient's altered mental status/sedation  Physical Exam: Vitals:   07/07/24 0945 07/07/24 1000  BP:  127/66  Pulse: 90 91  Resp: (!) 21 (!) 25  Temp:    SpO2: 92% 92%   Total I/O In: 905.9 [I.V.:225.4; NG/GT:250; IV Piggyback:430.5] Out: 340 [Urine:40; Stool:300]  Intake/Output Summary (Last 24 hours) at 07/07/2024 1018 Last data filed at 07/07/2024 1000 Gross per 24 hour  Intake 4932.44 ml  Output 2350 ml  Net 2582.44 ml   Constitutional: critically-appearing, no acute distress ENMT: icteric sclera, ears and nose without scars or lesions CV: normal rate, 1+ edema in the ble Respiratory: Bilateral breath sounds,  bilateral chest rise, frequent coughing, ventilated Gastrointestinal: Moderate distention, nontender Skin: Jaundice present Psych: Sedated, not interactive   Test Results I personally reviewed new and old clinical labs and radiology tests Lab Results  Component Value Date   NA 127 (L) 07/07/2024   K 2.8 (L) 07/07/2024   CL 85 (L) 07/07/2024   CO2 23 07/07/2024   BUN 119 (H) 07/07/2024   CREATININE 4.38 (H) 07/07/2024   GFR 93.70 12/20/2020   CALCIUM  7.4 (L) 07/07/2024   ALBUMIN  2.7 (L) 07/07/2024   PHOS 2.0 (L) 07/07/2024   PHOS 2.0 (L) 07/07/2024    CBC Recent Labs  Lab 07/03/24 0257 07/03/24 0448 07/06/24 0445 07/06/24 1656 07/07/24 0415  WBC 14.1*   < > 16.7* 15.4* 15.5*  NEUTROABS 12.1*  --   --   --   --   HGB 7.6*   < > 7.5* 7.2* 7.3*  HCT 21.2*   < > 20.3* 20.0* 20.3*  MCV 96.4   < > 91.4 93.0 92.7  PLT 130*   < >  46* 52* 61*   < > = values in this interval not displayed.

## 2024-07-07 NOTE — Progress Notes (Signed)
   07/07/24 0740  Adult Ventilator Settings  Vent Type Servo i  Humidity HME  Vent Mode PSV;CPAP  FiO2 (%) 40 %  Pressure Support 5 cmH20  PEEP 5 cmH20   Pt placed on PS/CPAP on above settings and is tolerating fairly well with slight tachypnea.

## 2024-07-07 NOTE — Progress Notes (Signed)
 eLink Physician-Brief Progress Note Patient Name: [name] DOB: September 24, 1970 MRN: [id number]   Date of Service  07/07/2024  HPI/Events of Note  Patient's MAP goal currently set to greater than 84 HRS.  Nephrology following-requesting goal MAP greater than 65 for optimal renal perfusion.  On maximal dosing of norepinephrine  and vasopressin .  eICU Interventions  Although weak evidence exists to support higher MAP goal with HRS, no guidelines currently in place to assist with decision making.  Favor not starting an additional vasopressor at this time.  Unable to achieve MAP goals with dual pressors and octreotide already.  Ongoing goals of care discussions are in place.  Will relax MAP goals to 70, continue current therapy     Intervention Category Major Interventions: Hypotension - evaluation and management  Keir Viernes 07/07/2024, 10:09 PM

## 2024-07-07 NOTE — Progress Notes (Signed)
 Brief Nutrition Support Note  Pt pressor and vent support increased today. Phosphorus and potassium remain low requiring frequent repletion. Pt up 14L net since admission. Ongoing GOC discussions about pt's status with family meeting scheduled for tomorrow. Will continue tube feeds at trickle rate with no plans to advance at this time. Will monitor GOC discussions and pt's status.  INTERVENTION:  Tube feeding via OG: continue trickle feeds Vital 1.5 at 20 ml/h (480 ml per day) Prosource TF20 60 ml daily   Monitor magnesium  and phosphorus daily x 4 occurrences, MD to replete as needed, as pt is at risk for refeeding syndrome given suspicion of malnutrition and alcohol  abuse. Continue thiamine  daily 500 mg IVPB per CIWA protocol Transition to 100 mg IV daily 11/22   Patient is currently intubated on ventilator support MV: 17.6 L/min Temp (24hrs), Avg:98.7 F (37.1 C), Min:97.8 F (36.6 C), Max:99.5 F (37.5 C) MAP (aline): 60-21mmHg  Propofol : 8.44 ml/hr (provides additional 222 kcal per day at current rate)   Nutritionally Relevant Medications: Scheduled Meds:  feeding supplement (PROSource TF20)  60 mL Per Tube Daily   insulin  aspart  0-6 Units Subcutaneous Q4H   lactulose   30 g Per Tube Q6H   pantoprazole  (PROTONIX ) IV  40 mg Intravenous Q12H   rifaximin   550 mg Per Tube TID   [START ON 07/09/2024] thiamine  (VITAMIN B1) injection  100 mg Intravenous Q24H   Continuous Infusions:  feeding supplement (VITAL 1.5 CAL) 20 mL/hr at 07/07/24 0900   norepinephrine  (LEVOPHED ) Adult infusion 17 mcg/min (07/07/24 0900)   octreotide  (SANDOSTATIN ) 500 mcg in sodium chloride  0.9 % 250 mL (2 mcg/mL) infusion 50 mcg/hr (07/07/24 0900)   piperacillin -tazobactam (ZOSYN )  IV 12.5 mL/hr at 07/07/24 0900   potassium chloride  100 mL/hr at 07/07/24 0900   propofol  (DIPRIVAN ) infusion 15 mcg/kg/min (07/07/24 0849)   thiamine  (VITAMIN B1) injection Stopped (07/06/24 1023)   vasopressin  0.03  Units/min (07/07/24 0900)     Labs Reviewed: Sodium 127/ Cl 85 (low) Potassium 2.8<--3.2<--3.4  Magnesium  2.2 (WNL) Phosphorus 2.0<--2.2<--1.7<--7.5 (226% drop in phos in two days prior to trickle feed initiation) CBG ranges from 168-205 mg/dL over the last 24 hours (still on trickle feeds) Ammonia 88 (high) AST 369/ ALT 168       Rollo Farquhar, MS, RDN, LDN Clinical Dietitian I Please reach out via secure chat

## 2024-07-07 NOTE — Progress Notes (Signed)
 eLink Physician-Brief Progress Note Patient Name: Tevin Shillingford DOB: 1971/06/19 MRN: [id number]   Date of Service  07/07/2024  HPI/Events of Note  K+ 2.9, Cr 4.38  eICU Interventions  KCL 10 meq iv Q 1 hour x 2 ordered.        Taurean Ju U Aymara Sassi 07/07/2024, 5:40 AM

## 2024-07-08 DIAGNOSIS — K7030 Alcoholic cirrhosis of liver without ascites: Secondary | ICD-10-CM | POA: Diagnosis not present

## 2024-07-08 DIAGNOSIS — G9341 Metabolic encephalopathy: Secondary | ICD-10-CM | POA: Diagnosis not present

## 2024-07-08 DIAGNOSIS — K922 Gastrointestinal hemorrhage, unspecified: Secondary | ICD-10-CM | POA: Diagnosis not present

## 2024-07-08 DIAGNOSIS — R571 Hypovolemic shock: Secondary | ICD-10-CM | POA: Diagnosis not present

## 2024-07-08 LAB — CULTURE, BLOOD (ROUTINE X 2)
Culture: NO GROWTH
Culture: NO GROWTH
Special Requests: ADEQUATE
Special Requests: ADEQUATE

## 2024-07-08 LAB — CBC
HCT: 20.9 % — ABNORMAL LOW (ref 39.0–52.0)
Hemoglobin: 7.7 g/dL — ABNORMAL LOW (ref 13.0–17.0)
MCH: 33.8 pg (ref 26.0–34.0)
MCHC: 36.8 g/dL — ABNORMAL HIGH (ref 30.0–36.0)
MCV: 91.7 fL (ref 80.0–100.0)
Platelets: 71 K/uL — ABNORMAL LOW (ref 150–400)
RBC: 2.28 MIL/uL — ABNORMAL LOW (ref 4.22–5.81)
RDW: 18.3 % — ABNORMAL HIGH (ref 11.5–15.5)
WBC: 24.8 K/uL — ABNORMAL HIGH (ref 4.0–10.5)
nRBC: 0.5 % — ABNORMAL HIGH (ref 0.0–0.2)

## 2024-07-08 LAB — HEPATIC FUNCTION PANEL
ALT: 125 U/L — ABNORMAL HIGH (ref 0–44)
AST: 234 U/L — ABNORMAL HIGH (ref 15–41)
Albumin: 2.4 g/dL — ABNORMAL LOW (ref 3.5–5.0)
Alkaline Phosphatase: 178 U/L — ABNORMAL HIGH (ref 38–126)
Bilirubin, Direct: 12.6 mg/dL — ABNORMAL HIGH (ref 0.0–0.2)
Indirect Bilirubin: 8.1 mg/dL
Total Bilirubin: 20.7 mg/dL (ref 0.0–1.2)
Total Protein: 5.6 g/dL — ABNORMAL LOW (ref 6.5–8.1)

## 2024-07-08 LAB — RENAL FUNCTION PANEL
Albumin: 2.4 g/dL — ABNORMAL LOW (ref 3.5–5.0)
Anion gap: 22 — ABNORMAL HIGH (ref 5–15)
BUN: 126 mg/dL — ABNORMAL HIGH (ref 6–20)
CO2: 21 mmol/L — ABNORMAL LOW (ref 22–32)
Calcium: 7.5 mg/dL — ABNORMAL LOW (ref 8.9–10.3)
Chloride: 87 mmol/L — ABNORMAL LOW (ref 98–111)
Creatinine, Ser: 4.52 mg/dL — ABNORMAL HIGH (ref 0.61–1.24)
GFR, Estimated: 15 mL/min — ABNORMAL LOW (ref >60)
Glucose, Bld: 172 mg/dL — ABNORMAL HIGH (ref 70–99)
Phosphorus: 3.2 mg/dL (ref 2.5–4.6)
Potassium: 3.3 mmol/L — ABNORMAL LOW (ref 3.5–5.1)
Sodium: 130 mmol/L — ABNORMAL LOW (ref 135–145)

## 2024-07-08 LAB — PROTIME-INR
INR: 1.5 — ABNORMAL HIGH (ref 0.8–1.2)
Prothrombin Time: 19.1 s — ABNORMAL HIGH (ref 11.4–15.2)

## 2024-07-08 LAB — TRIGLYCERIDES: Triglycerides: 125 mg/dL (ref <150)

## 2024-07-08 LAB — GLUCOSE, CAPILLARY
Glucose-Capillary: 190 mg/dL — ABNORMAL HIGH (ref 70–99)
Glucose-Capillary: 175 mg/dL — ABNORMAL HIGH (ref 70–99)
Glucose-Capillary: 179 mg/dL — ABNORMAL HIGH (ref 70–99)
Glucose-Capillary: 157 mg/dL — ABNORMAL HIGH (ref 70–99)

## 2024-07-08 LAB — MAGNESIUM: Magnesium: 2.3 mg/dL (ref 1.7–2.4)

## 2024-07-08 LAB — PHOSPHORUS: Phosphorus: 3.1 mg/dL (ref 2.5–4.6)

## 2024-07-08 LAB — AMMONIA: Ammonia: 77 umol/L — ABNORMAL HIGH (ref 9–35)

## 2024-07-08 MED ORDER — HYDROMORPHONE BOLUS VIA INFUSION: 0.2500 mg | INTRAVENOUS | Status: DC | PRN

## 2024-07-08 MED ORDER — DOCUSATE SODIUM 50 MG/5ML PO LIQD: 100.0000 mg | Freq: Two times a day (BID) | ORAL | Status: DC

## 2024-07-08 MED ORDER — POLYETHYLENE GLYCOL 3350 17 G PO PACK: 17.0000 g | PACK | Freq: Every day | ORAL | Status: DC

## 2024-07-08 MED ORDER — MIDAZOLAM HCL (PF) 2 MG/2ML IJ SOLN
1.0000 mg | INTRAMUSCULAR | Status: DC | PRN
  Filled 2024-07-08: qty 2

## 2024-07-08 MED ORDER — HYDROMORPHONE HCL-NACL 50-0.9 MG/50ML-% IV SOLN
0.5000 mg/h | INTRAVENOUS | Status: DC
  Administered 2024-07-08 – 2024-07-11 (×3): 1 mg/h via INTRAVENOUS
  Filled 2024-07-08 (×3): qty 50

## 2024-07-08 MED ORDER — POTASSIUM CHLORIDE 20 MEQ PO PACK
40.0000 meq | PACK | Freq: Once | ORAL | Status: AC
  Administered 2024-07-08: 40 meq
  Filled 2024-07-08: qty 2

## 2024-07-08 MED ORDER — HYDROMORPHONE HCL 1 MG/ML IJ SOLN
1.0000 mg | Freq: Once | INTRAMUSCULAR | Status: AC
  Administered 2024-07-08: 1 mg via INTRAVENOUS
  Filled 2024-07-08: qty 1

## 2024-07-08 NOTE — IPAL (Signed)
  Interdisciplinary Goals of Care Family Meeting   Date carried out:: 07/08/2024  Location of the meeting: Phone conference  Member's involved: Physician, Bedside Registered Nurse, and Family Member or next of kin  Durable Power of Attorney or acting medical decision maker: daughter [name]    Discussion: We discussed goals of care for [name] .  Two RNs & I phone conferenced with 2 sisters and daughter. We reviewed his ongoing care, baseline liver disease, extensive ETOH abuse and addiction history. His family relates that he has a poor QOL at baseline, and they agree with concerns that it likely would not improve after this hospitalization. They are in agreement that comfort focused measures will be the appropriate thing for him. Two of them live on the [address] and cannot get here until next week. We discussed DNR and no escalation until they arrive, and getting rid of things that contribute to discomfort that we should stop doing. Accuchecks and insulin  stopped. They agree with starting pain meds since he appeared uncomfortable this morning on exam. All questions were answered.  Code status: Full DNR  Disposition: Continue current acute care; when family arrives from the [address] will transition to full comfort. Keep current measures until then, but no escalation (no transfusions, no addition of pressors, no RRT- all clarified with the family).   Time spent for the meeting: 25 min.  [name] 07/08/2024, 12:08 PM

## 2024-07-08 NOTE — Progress Notes (Signed)
 NAME:  [name], MRN:  [id number], DOB:  06/08/1971, LOS: 5 ADMISSION DATE:  07/03/2024, CONSULTATION DATE:  07/03/24 REFERRING MD:  EDP CHIEF COMPLAINT:  Shock   History of Present Illness:  [name] is a 53 year old male, daily smoker with history of alcohol  dependence, withdrawal seizures, liver cirrhosis, PTSD, and schizoaffective disorder who is brought in by EMS after falling and being found on the floor covered in urine and feces.   In the ER, vitals showed temp of 94.29F, BP 74/39, RR 25, Spo2 94% on 4L O2.   Labs show Na 126, K 3.1, Cl <65, Serum bicarb 17, Glucose 61, Cr 4.83 (baseline 0.79), Alk phos 171, albumin  2.1, AST/ALT 384/134, Ammonia 77, T. Bili 15.5, CK 285, Trop 25. CBC shows WBC 14, Hgb 7.6g/dL, plts 869. Lactic acid >15.  He reports throwing up dark blood over the past 2 days. He has epigastric discomfort. He is alert to person and time but not place.   CT Head without acute abnormality. CT C-spine without acute abnormality. Chest radiograph unremarkable.  PCCM called for admission.   Pertinent  Medical History   Past Medical History:  Diagnosis Date   Alcohol  abuse    Anxiety    at age 81   Blood transfusion without reported diagnosis    Cirrhosis (HCC)    Depression    at age 38   Epileptic seizure (HCC)    Not associated with EtOH withdrawal   GERD (gastroesophageal reflux disease)    Hypertension    Nausea and vomiting    Psychosis (HCC)    PTSD (post-traumatic stress disorder)    Schizoaffective disorder    Seizure disorder (HCC)    related to etoh seizure   Symptomatic anemia    Significant Hospital Events: Including procedures, antibiotic start and stop dates in addition to other pertinent events   11/16 admitted to ICU for hemorrhagic shock with UGIB, cirrhosis 11/17 EGD with severe esophagitis 11/19 woke up agitated, not directable 11/20 MAP goal 85 so increased pressors  Interim History / Subjective:  No acute events  overnight. Afebrile overnight.  NE Vaso 0.04 Propofol  octreotide   Objective    Blood pressure (!) 140/69, pulse 98, temperature (!) 97.5 F (36.4 C), temperature source Oral, resp. rate (!) 29, height 6' (1.829 m), weight 100 kg, SpO2 93%.    Vent Mode: PSV;CPAP FiO2 (%):  [40 %] 40 % Set Rate:  [20 bmp] 20 bmp Vt Set:  [620 mL] 620 mL PEEP:  [5 cmH20] 5 cmH20 Pressure Support:  [5 cmH20-8 cmH20] 8 cmH20 Plateau Pressure:  [25 cmH20-28 cmH20] 28 cmH20   Intake/Output Summary (Last 24 hours) at 07/08/2024 1033 Last data filed at 07/08/2024 0800 Gross per 24 hour  Intake 2846.84 ml  Output 2240 ml  Net 606.84 ml   Filed Weights   07/06/24 0454 07/07/24 0500 07/08/24 0448  Weight: 95.9 kg 96.9 kg 100 kg    Examination: General:  critically ill appearing man intubated, sedated HEENT: [place], icteric sclera Lungs: on SBT using accessory muscles CVS:  s1S2, RRR Abdomen: obese, soft, NT Extremities: +edema Neuro:  RASS -5  I/O +1.3 L, net +15.1L for admission UOP 740  Na+ 130 K+ 3.3 BUN 126 Cr 4.52 Bili 20 Phos 3.1 Ca+ 7.5 Ammonia 77 WBC 24.8 H/H 7.7/20.9 Platelets 71  Blood cultures NG final   Resolved problem list   Hypocalcemia Hypophosphatemia    Assessment and Plan   Hemorrhagic shock due to  acute UGIB with severe esophagitis, moderate portal HTN gastropathy, duodenal ulcerations. Cannot r/o varices, but none seen.  H/o ETOH cirrhosis with thrombocytopenia, MELDNA 39 (a/w ~50% mortality at 6 months) -con't PPI -3 days of octreotide > stop today -daily CBC unless signs of bleeding -transfuse for Hb <7 -goal plateelts >50 -daily LFTs and INR  Ongoing shock- suspect this is mixed etiology with sedation, cirrhosis> No obvious source of sepsis.  Large increase in WBC today, but afebrile -keep off SDS -goal MAP >85 -antibiotics empirically  Acute metabolic encephalopathy due to HE precipitated by UGIB, now with severe azotemia and AKI H/o  schizoaffective disorder -Propofol , avoid fentanyl ; would prefer dilaudid  if needing an opiate -lactulose , rifaximin   Acute respiratory failure with hypoxia requiring MV -LTVV -VAP prevention protocol -PAD protocol -mental status precludes extubation unless palliative  H/o ETOH abuse and withdrawal seizures -propofol   AKI on CKD with oliguria Hypervolemic hyponatremia Possible HRS -goal MAP{ >84- con't NE & vaso -strict I/O -renally dose meds, avoid nephrotoxic meds  Hypokalemia, due to stool output -repleting K+  Hyperglycemia -SSI PRN; fairly minimal requirements -goal BG 140-180  At risk for malnutrition -TF, vitamins  Family meeting today.   Labs   CBC: Recent Labs  Lab 07/03/24 0257 07/03/24 0448 07/05/24 0428 07/06/24 0445 07/06/24 1656 07/07/24 0415 07/08/24 0458  WBC 14.1*   < > 15.7* 16.7* 15.4* 15.5* 24.8*  NEUTROABS 12.1*  --   --   --   --   --   --   HGB 7.6*   < > 8.0* 7.5* 7.2* 7.3* 7.7*  HCT 21.2*   < > 21.7* 20.3* 20.0* 20.3* 20.9*  MCV 96.4   < > 90.8 91.4 93.0 92.7 91.7  PLT 130*   < > 54* 46* 52* 61* 71*   < > = values in this interval not displayed.    Basic Metabolic Panel: Recent Labs  Lab 07/03/24 0448 07/03/24 0640 07/04/24 0420 07/04/24 1214 07/05/24 1218 07/05/24 1540 07/06/24 0445 07/07/24 0415 07/07/24 1128 07/08/24 0458  NA  --    < > 123*   < >  --  124* 125* 127* 128* 130*  K  --    < > 3.4*   < >  --  3.4* 3.2* 2.8* 3.4* 3.3*  CL  --    < > 77*   < >  --  80* 82* 85* 86* 87*  CO2  --    < > 25   < >  --  22 21* 23 22 21*  GLUCOSE  --    < > 149*   < >  --  154* 199* 173* 169* 172*  BUN  --    < > 90*   < >  --  96* 105* 119* 119* 126*  CREATININE  --    < > 4.40*   < >  --  4.03* 4.53* 4.38* 4.43* 4.52*  CALCIUM   --    < > 7.8*   < >  --  7.4* 7.1* 7.4* 7.4* 7.5*  MG 1.5*  --  2.4  --  1.9  --  2.2 2.2  --  2.3  PHOS 7.5*  --   --   --  1.7*  --  2.2* 2.0*  2.0*  --  3.1  3.2   < > = values in this interval  not displayed.   GFR: Estimated Creatinine Clearance: 23.7 mL/min (A) (by C-G formula based on SCr of 4.52 mg/dL (  H)). Recent Labs  Lab 07/03/24 1434 07/03/24 1946 07/04/24 0744 07/04/24 1312 07/04/24 1748 07/04/24 2201 07/05/24 0428 07/06/24 0445 07/06/24 1656 07/07/24 0415 07/08/24 0458  WBC  --    < >  --   --    < >  --    < > 16.7* 15.4* 15.5* 24.8*  LATICACIDVEN >9.0*  --  2.5* 2.1*  --  2.6*  --   --   --   --   --    < > = values in this interval not displayed.    Liver Function Tests: Recent Labs  Lab 07/03/24 0257 07/03/24 1946 07/05/24 1540 07/06/24 1031 07/07/24 0415 07/08/24 0458  AST 384* 464* 471* 369*  --  234*  ALT 134* 167* 176* 168*  --  125*  ALKPHOS 171* 152* 100 110  --  178*  BILITOT 15.5* 19.2* 20.2* 20.8*  --  20.7*  PROT 4.9* 4.8* 5.9* 5.6*  --  5.6*  ALBUMIN  2.1* 2.1* 3.4* 3.0* 2.7* 2.4*  2.4*   No results for input(s): LIPASE, AMYLASE in the last 168 hours. Recent Labs  Lab 07/04/24 0420 07/05/24 1218 07/06/24 1454 07/07/24 0415 07/08/24 0458  AMMONIA 88* 93* 120* 88* 77*     Critical care time:     This patient is critically ill with multiple organ system failure which requires frequent high complexity decision making, assessment, support, evaluation, and titration of therapies. This was completed through the application of advanced monitoring technologies and extensive interpretation of multiple databases. During this encounter critical care time was devoted to patient care services described in this note for 36 minutes.  [name], DO 07/08/24 11:00 AM [facility]  For contact information, see Amion. If no response to pager, please call PCCM consult pager. After hours, 7PM- 7AM, please call Elink.

## 2024-07-08 NOTE — Progress Notes (Signed)
 Nephrology Follow-Up Consult note   Assessment/Recommendations: [name] is a/an 53 y.o. male with a past medical history significant for alcohol  dependence, history of withdrawal seizures, liver cirrhosis complicated by varices, schizoaffective disorder who present w/ shock complicated by AKI   Severe Nonoliguric AKI: Baseline creatinine normal.  Creatinine 4.8 on arrival likely some HRS physiology but also ATN at this point - Has been receiving treatment for HRS with octreotide , albumin , norepinephrine  -Patient is not showing significant signs of renal recovery -Recommend advancing goals of care conversations -Continue to monitor daily Cr, Dose meds for GFR -Monitor Daily I/Os, Daily weight  -Maintain MAP>65 for optimal renal perfusion.  -Avoid nephrotoxic medications including NSAIDs -Use synthetic opioids (Fentanyl /Dilaudid ) if needed   Severe shock: Likely septic component compounded on cirrhosis.  Continue with antibiotics and pressors per primary team   GI bleed: History of varices EGD only with friable mucosa.  Continue treatment per primary team   Altered mental status: Likely multifactorial.  Uremia may be playing some role but likely hepatic encephalopathy a driving factor.  Continue with lactulose .  Holding off on renal replacement therapy for now.  Essentially not a candidate   Hyponatremia: Unlikely the cause of his altered mental status but likely associated with cirrhosis and AKI.  Continue to monitor routinely  Hypokalemia: Continue with replacement per IV as needed     Alcoholic cirrhosis: Management per primary team and GI  Goals of care: I discussed clinical status with [name] on 11/20.  Dr. [name] discussed with daughter.  Planning for family meeting.  Not felt to be a good candidate for dialysis.  Low would recommend comfort measures   Recommendations conveyed to primary service.    [facility] 07/08/2024 10:13  AM  ___________________________________________________________  CC: Found down  Interval History/Subjective: Pressor requirement increasing.  Urine output remains lower.  BUN rising.  Medications:  Current Facility-Administered Medications  Medication Dose Route Frequency Provider Last Rate Last Admin   Chlorhexidine  Gluconate Cloth 2 % PADS 6 each  6 each Topical Daily Kara Dorn NOVAK, MD   6 each at 07/08/24 1007   docusate sodium  (COLACE) capsule 100 mg  100 mg Oral BID PRN Kara Dorn NOVAK, MD       feeding supplement (PROSource TF20) liquid 60 mL  60 mL Per Tube Daily Sharie Bourbon, MD   60 mL at 07/08/24 1006   feeding supplement (VITAL 1.5 CAL) liquid 1,000 mL  1,000 mL Per Tube Continuous Hussein, Abdullahi, MD 20 mL/hr at 07/08/24 0700 Infusion Verify at 07/08/24 0700   Gerhardt's butt cream   Topical BID Sharie Bourbon, MD   Given at 07/08/24 1007   insulin  aspart (novoLOG ) injection 0-6 Units  0-6 Units Subcutaneous Q4H Ogan, Okoronkwo U, MD   1 Units at 07/08/24 0818   lactulose  (CHRONULAC ) 10 GM/15ML solution 30 g  30 g Per Tube Q6H [name] Doffing P, DO   30 g at 07/08/24 1006   LORazepam  (ATIVAN ) injection 1-2 mg  1-2 mg Intravenous Q1H PRN Sharie Bourbon, MD       norepinephrine  (LEVOPHED ) 16 mg in 250mL (0.064 mg/mL) premix infusion  0-40 mcg/min Intravenous Titrated Paliwal, Aditya, MD 35.6 mL/hr at 07/08/24 0700 38 mcg/min at 07/08/24 0700   octreotide  (SANDOSTATIN ) 500 mcg in sodium chloride  0.9 % 250 mL (2 mcg/mL) infusion  50 mcg/hr Intravenous Continuous Mesner, Jason, MD 25 mL/hr at 07/08/24 0700 50 mcg/hr at 07/08/24 0700   Oral care mouth rinse  15  mL Mouth Rinse Q2H Sharie Bourbon, MD   15 mL at 07/08/24 1007   Oral care mouth rinse  15 mL Mouth Rinse PRN Sharie Bourbon, MD       pantoprazole  (PROTONIX ) injection 40 mg  40 mg Intravenous Q12H Kara Dorn NOVAK, MD   40 mg at 07/08/24 1006   piperacillin -tazobactam (ZOSYN ) IVPB 3.375 g  3.375  g Intravenous Q8H Hussein, Bourbon, MD 12.5 mL/hr at 07/08/24 0700 Infusion Verify at 07/08/24 0700   polyethylene glycol (MIRALAX  / GLYCOLAX ) packet 17 g  17 g Oral Daily PRN Kara Dorn NOVAK, MD       propofol  (DIPRIVAN ) 1000 MG/100ML infusion  0-80 mcg/kg/min Intravenous Titrated Sharie Bourbon, MD 11.26 mL/hr at 07/08/24 0700 20 mcg/kg/min at 07/08/24 0700   rifaximin  (XIFAXAN ) tablet 550 mg  550 mg Per Tube TID Sharie Bourbon, MD   550 mg at 07/08/24 1006   thiamine  (VITAMIN B1) 500 mg in sodium chloride  0.9 % 50 mL IVPB  500 mg Intravenous Daily Sharie Bourbon, MD 110 mL/hr at 07/08/24 1006 500 mg at 07/08/24 1006   [START ON 07/09/2024] thiamine  (VITAMIN B1) injection 100 mg  100 mg Intravenous Q24H Sharie Bourbon, MD       vasopressin  (PITRESSIN) 20 Units in 100 mL (0.2 unit/mL) infusion-*FOR SHOCK*  0.04 Units/min Intravenous Continuous Paliwal, Aditya, MD 12 mL/hr at 07/08/24 0833 0.04 Units/min at 07/08/24 9166      Review of Systems: Unable to obtain due to the patient's altered mental status/sedation  Physical Exam: Vitals:   07/08/24 0700 07/08/24 0749  BP: (!) 140/69   Pulse: 98   Resp: (!) 29   Temp:  (!) 97.5 F (36.4 C)  SpO2: 93%    Total I/O In: -  Out: 105 [Urine:105]  Intake/Output Summary (Last 24 hours) at 07/08/2024 1013 Last data filed at 07/08/2024 0800 Gross per 24 hour  Intake 2846.84 ml  Output 2240 ml  Net 606.84 ml   Constitutional: critically-appearing, no acute distress ENMT: icteric sclera, ears and nose without scars or lesions CV: normal rate, 1+ edema in the ble Respiratory: Bilateral breath sounds, bilateral chest rise, frequent coughing, ventilated Gastrointestinal: Moderate distention, nontender Skin: Jaundice present Psych: Sedated, not interactive   Test Results I personally reviewed new and old clinical labs and radiology tests Lab Results  Component Value Date   NA 130 (L) 07/08/2024   K 3.3 (L)  07/08/2024   CL 87 (L) 07/08/2024   CO2 21 (L) 07/08/2024   BUN 126 (H) 07/08/2024   CREATININE 4.52 (H) 07/08/2024   GFR 93.70 12/20/2020   CALCIUM  7.5 (L) 07/08/2024   ALBUMIN  2.4 (L) 07/08/2024   ALBUMIN  2.4 (L) 07/08/2024   PHOS 3.1 07/08/2024   PHOS 3.2 07/08/2024    CBC Recent Labs  Lab 07/03/24 0257 07/03/24 0448 07/06/24 1656 07/07/24 0415 07/08/24 0458  WBC 14.1*   < > 15.4* 15.5* 24.8*  NEUTROABS 12.1*  --   --   --   --   HGB 7.6*   < > 7.2* 7.3* 7.7*  HCT 21.2*   < > 20.0* 20.3* 20.9*  MCV 96.4   < > 93.0 92.7 91.7  PLT 130*   < > 52* 61* 71*   < > = values in this interval not displayed.

## 2024-07-08 NOTE — Plan of Care (Signed)
 Palliative care consult received.  Case discussed with Dr. [name].  She had family meeting today via phone and goals/plan clear at this point.  Will hold on palliative consult.  Please call or reconsult if we can be of further care of [name] moving forward.  Amaryllis Meissner, MD [facility] Palliative Medicine Team [phone]

## 2024-07-08 NOTE — Progress Notes (Signed)
 Brief Nephrology Note  Patient planning to transition to comfort once family comes from [facility]. No Plans for RRT. Care remains supportive. We will sign off at this time.

## 2024-07-08 NOTE — Progress Notes (Signed)
 Nutrition Brief Note  Chart reviewed. GOC discussion today, pt now DNR-no escalation of care with plans to transition to comfort care once family arrives from [facility].  No further nutrition interventions planned at this time.  Please re-consult as needed.   Betsey Finger MS, RDN, LDN, CNSC Registered Dietitian 3 Clinical Nutrition RD Inpatient Contact Info in Amion

## 2024-07-09 DIAGNOSIS — K922 Gastrointestinal hemorrhage, unspecified: Secondary | ICD-10-CM | POA: Diagnosis not present

## 2024-07-09 DIAGNOSIS — G9341 Metabolic encephalopathy: Secondary | ICD-10-CM | POA: Diagnosis not present

## 2024-07-09 DIAGNOSIS — K7030 Alcoholic cirrhosis of liver without ascites: Secondary | ICD-10-CM | POA: Diagnosis not present

## 2024-07-09 DIAGNOSIS — R571 Hypovolemic shock: Secondary | ICD-10-CM | POA: Diagnosis not present

## 2024-07-09 LAB — BASIC METABOLIC PANEL WITH GFR
Anion gap: 19 — ABNORMAL HIGH (ref 5–15)
BUN: 136 mg/dL — ABNORMAL HIGH (ref 6–20)
CO2: 22 mmol/L (ref 22–32)
Calcium: 7.8 mg/dL — ABNORMAL LOW (ref 8.9–10.3)
Chloride: 89 mmol/L — ABNORMAL LOW (ref 98–111)
Creatinine, Ser: 5.2 mg/dL — ABNORMAL HIGH (ref 0.61–1.24)
GFR, Estimated: 13 mL/min — ABNORMAL LOW (ref >60)
Glucose, Bld: 150 mg/dL — ABNORMAL HIGH (ref 70–99)
Potassium: 3.8 mmol/L (ref 3.5–5.1)
Sodium: 130 mmol/L — ABNORMAL LOW (ref 135–145)

## 2024-07-09 LAB — GLUCOSE, CAPILLARY
Glucose-Capillary: 144 mg/dL — ABNORMAL HIGH (ref 70–99)
Glucose-Capillary: 153 mg/dL — ABNORMAL HIGH (ref 70–99)

## 2024-07-09 MED ORDER — FUROSEMIDE 10 MG/ML IJ SOLN
80.0000 mg | INTRAMUSCULAR | Status: AC
  Administered 2024-07-09 (×2): 80 mg via INTRAVENOUS
  Filled 2024-07-09 (×2): qty 8

## 2024-07-09 NOTE — Progress Notes (Signed)
 NAME:  [name], MRN:  [id number], DOB:  June 13, 1971, LOS: 6 ADMISSION DATE:  07/03/2024, CONSULTATION DATE:  07/03/24 REFERRING MD:  EDP CHIEF COMPLAINT:  Shock   History of Present Illness:  [name] is a 53 year old male, daily smoker with history of alcohol  dependence, withdrawal seizures, liver cirrhosis, PTSD, and schizoaffective disorder who is brought in by EMS after falling and being found on the floor covered in urine and feces.   In the ER, vitals showed temp of 94.37F, BP 74/39, RR 25, Spo2 94% on 4L O2.   Labs show Na 126, K 3.1, Cl <65, Serum bicarb 17, Glucose 61, Cr 4.83 (baseline 0.79), Alk phos 171, albumin  2.1, AST/ALT 384/134, Ammonia 77, T. Bili 15.5, CK 285, Trop 25. CBC shows WBC 14, Hgb 7.6g/dL, plts 869. Lactic acid >15.  He reports throwing up dark blood over the past 2 days. He has epigastric discomfort. He is alert to person and time but not place.   CT Head without acute abnormality. CT C-spine without acute abnormality. Chest radiograph unremarkable.  PCCM called for admission.   Pertinent  Medical History   Past Medical History:  Diagnosis Date   Alcohol  abuse    Anxiety    at age 73   Blood transfusion without reported diagnosis    Cirrhosis (HCC)    Depression    at age 59   Epileptic seizure (HCC)    Not associated with EtOH withdrawal   GERD (gastroesophageal reflux disease)    Hypertension    Nausea and vomiting    Psychosis (HCC)    PTSD (post-traumatic stress disorder)    Schizoaffective disorder    Seizure disorder (HCC)    related to etoh seizure   Symptomatic anemia    Significant Hospital Events: Including procedures, antibiotic start and stop dates in addition to other pertinent events   11/16 admitted to ICU for hemorrhagic shock with UGIB, cirrhosis 11/17 EGD with severe esophagitis 11/19 woke up agitated, not directable 11/20 MAP goal 85 so increased pressors 11/21 Family coming in town soon; will eventually go  comfort care. Currently no escalation.   Interim History / Subjective:  No acute events overnight NE 40mcg Vaso 0.04 Propofol  30 Dilaudid  1   Objective    Blood pressure 132/65, pulse 89, temperature 98.6 F (37 C), temperature source Axillary, resp. rate 20, height 6' (1.829 m), weight 98 kg, SpO2 94%.    Vent Mode: PRVC FiO2 (%):  [40 %] 40 % Set Rate:  [20 bmp] 20 bmp Vt Set:  [620 mL] 620 mL PEEP:  [5 cmH20] 5 cmH20 Pressure Support:  [8 cmH20] 8 cmH20 Plateau Pressure:  [20 cmH20-25 cmH20] 24 cmH20   Intake/Output Summary (Last 24 hours) at 07/09/2024 1337 Last data filed at 07/09/2024 1140 Gross per 24 hour  Intake 1880.87 ml  Output 2450 ml  Net -569.13 ml   Filed Weights   07/07/24 0500 07/08/24 0448 07/09/24 0500  Weight: 96.9 kg 100 kg 98 kg    Examination: General:  critically ill appearing man lying in bed in NAD, intubated & sedated HEENT: [place]/AT, eyes icteric Lungs: breathing comfortably on MV, no significant ETT secretions CVS:  S1S2, RRR Abdomen: obese, distended, NT Extremities: ++edema Neuro:  RASS  -5  I/O -350 cc, net +14.7L for admission UOP 720cc  Na+  130 K+3.8 BUN 136 Cr 5.2   Resolved problem list   Hypocalcemia Hypophosphatemia  Hypokalemia  Assessment and Plan   Hemorrhagic shock  due to acute UGIB with severe esophagitis, moderate portal HTN gastropathy, duodenal ulcerations. Cannot r/o varices, but none seen.  H/o ETOH cirrhosis with thrombocytopenia, MELDNA 39 (a/w ~50% mortality at 6 months) -con't PPI -no additional transfusions per GOC discussion  Ongoing shock- suspect this is mixed etiology with sedation, cirrhosis> No obvious source of sepsis.  -steroids -staying off SDS due to risk of bleeding associated with this  Acute metabolic encephalopathy due to HE precipitated by UGIB, now with severe azotemia and AKI H/o schizoaffective disorder -propofol  & dilaudid  -con't lactulose , rifaximin   Acute respiratory  failure with hypoxia requiring MV -LTVV -VAP prevention protocol -PAD protocol; goal RASS -2 to -3. Does not tolerate being more awake. -planning for terminal extubation this week with family  H/o ETOH abuse and withdrawal seizures -propofol   AKI on CKD with oliguria Hypervolemic hyponatremia Possible HRS -high dose lasix  trial today -family does not want to pursue RRT.  Hyperglycemia -no additional monitoring or treatment at this time to limit discomfort  At risk for malnutrition -TF & vitamins  Family working on coming into town from [place] [employer]. Planning for terminal extubation and comfort care this week. No escalation in the interim. Family understands the risk that he may not make it until they are able to get to town.   Labs   CBC: Recent Labs  Lab 07/03/24 0257 07/03/24 0448 07/05/24 0428 07/06/24 0445 07/06/24 1656 07/07/24 0415 07/08/24 0458  WBC 14.1*   < > 15.7* 16.7* 15.4* 15.5* 24.8*  NEUTROABS 12.1*  --   --   --   --   --   --   HGB 7.6*   < > 8.0* 7.5* 7.2* 7.3* 7.7*  HCT 21.2*   < > 21.7* 20.3* 20.0* 20.3* 20.9*  MCV 96.4   < > 90.8 91.4 93.0 92.7 91.7  PLT 130*   < > 54* 46* 52* 61* 71*   < > = values in this interval not displayed.    Basic Metabolic Panel: Recent Labs  Lab 07/03/24 0448 07/03/24 0640 07/04/24 0420 07/04/24 1214 07/05/24 1218 07/05/24 1540 07/06/24 0445 07/07/24 0415 07/07/24 1128 07/08/24 0458 07/09/24 0330  NA  --    < > 123*   < >  --    < > 125* 127* 128* 130* 130*  K  --    < > 3.4*   < >  --    < > 3.2* 2.8* 3.4* 3.3* 3.8  CL  --    < > 77*   < >  --    < > 82* 85* 86* 87* 89*  CO2  --    < > 25   < >  --    < > 21* 23 22 21* 22  GLUCOSE  --    < > 149*   < >  --    < > 199* 173* 169* 172* 150*  BUN  --    < > 90*   < >  --    < > 105* 119* 119* 126* 136*  CREATININE  --    < > 4.40*   < >  --    < > 4.53* 4.38* 4.43* 4.52* 5.20*  CALCIUM   --    < > 7.8*   < >  --    < > 7.1* 7.4* 7.4* 7.5* 7.8*  MG 1.5*   --  2.4  --  1.9  --  2.2 2.2  --  2.3  --  PHOS 7.5*  --   --   --  1.7*  --  2.2* 2.0*  2.0*  --  3.1  3.2  --    < > = values in this interval not displayed.   GFR: Estimated Creatinine Clearance: 20.4 mL/min (A) (by C-G formula based on SCr of 5.2 mg/dL (H)). Recent Labs  Lab 07/03/24 1434 07/03/24 1946 07/04/24 0744 07/04/24 1312 07/04/24 1748 07/04/24 2201 07/05/24 0428 07/06/24 0445 07/06/24 1656 07/07/24 0415 07/08/24 0458  WBC  --    < >  --   --    < >  --    < > 16.7* 15.4* 15.5* 24.8*  LATICACIDVEN >9.0*  --  2.5* 2.1*  --  2.6*  --   --   --   --   --    < > = values in this interval not displayed.    Liver Function Tests: Recent Labs  Lab 07/03/24 0257 07/03/24 1946 07/05/24 1540 07/06/24 1031 07/07/24 0415 07/08/24 0458  AST 384* 464* 471* 369*  --  234*  ALT 134* 167* 176* 168*  --  125*  ALKPHOS 171* 152* 100 110  --  178*  BILITOT 15.5* 19.2* 20.2* 20.8*  --  20.7*  PROT 4.9* 4.8* 5.9* 5.6*  --  5.6*  ALBUMIN  2.1* 2.1* 3.4* 3.0* 2.7* 2.4*  2.4*   No results for input(s): LIPASE, AMYLASE in the last 168 hours. Recent Labs  Lab 07/04/24 0420 07/05/24 1218 07/06/24 1454 07/07/24 0415 07/08/24 0458  AMMONIA 88* 93* 120* 88* 77*     Critical care time:       [name], DO 07/09/24 1:49 PM [facility]  For contact information, see Amion. If no response to pager, please call PCCM consult pager. After hours, 7PM- 7AM, please call Elink.

## 2024-07-10 DIAGNOSIS — K922 Gastrointestinal hemorrhage, unspecified: Secondary | ICD-10-CM | POA: Diagnosis not present

## 2024-07-10 DIAGNOSIS — K7030 Alcoholic cirrhosis of liver without ascites: Secondary | ICD-10-CM | POA: Diagnosis not present

## 2024-07-10 DIAGNOSIS — G9341 Metabolic encephalopathy: Secondary | ICD-10-CM | POA: Diagnosis not present

## 2024-07-10 DIAGNOSIS — R571 Hypovolemic shock: Secondary | ICD-10-CM | POA: Diagnosis not present

## 2024-07-10 LAB — BASIC METABOLIC PANEL WITH GFR
Anion gap: 14 (ref 5–15)
BUN: 156 mg/dL — ABNORMAL HIGH (ref 6–20)
CO2: 26 mmol/L (ref 22–32)
Calcium: 8 mg/dL — ABNORMAL LOW (ref 8.9–10.3)
Chloride: 89 mmol/L — ABNORMAL LOW (ref 98–111)
Creatinine, Ser: 5.9 mg/dL — ABNORMAL HIGH (ref 0.61–1.24)
GFR, Estimated: 11 mL/min — ABNORMAL LOW (ref >60)
Glucose, Bld: 137 mg/dL — ABNORMAL HIGH (ref 70–99)
Potassium: 4.3 mmol/L (ref 3.5–5.1)
Sodium: 129 mmol/L — ABNORMAL LOW (ref 135–145)

## 2024-07-10 MED ORDER — PIPERACILLIN-TAZOBACTAM IN DEX 2-0.25 GM/50ML IV SOLN
2.2500 g | Freq: Four times a day (QID) | INTRAVENOUS | Status: DC
  Administered 2024-07-10 – 2024-07-12 (×9): 2.25 g via INTRAVENOUS
  Filled 2024-07-10 (×10): qty 50

## 2024-07-10 NOTE — Progress Notes (Signed)
 NAME:  [name], MRN:  [id number], DOB:  1971-06-02, LOS: 7 ADMISSION DATE:  07/03/2024, CONSULTATION DATE:  07/03/24 REFERRING MD:  EDP CHIEF COMPLAINT:  Shock   History of Present Illness:  [name] is a 53 year old male, daily smoker with history of alcohol  dependence, withdrawal seizures, liver cirrhosis, PTSD, and schizoaffective disorder who is brought in by EMS after falling and being found on the floor covered in urine and feces.   In the ER, vitals showed temp of 94.1F, BP 74/39, RR 25, Spo2 94% on 4L O2.   Labs show Na 126, K 3.1, Cl <65, Serum bicarb 17, Glucose 61, Cr 4.83 (baseline 0.79), Alk phos 171, albumin  2.1, AST/ALT 384/134, Ammonia 77, T. Bili 15.5, CK 285, Trop 25. CBC shows WBC 14, Hgb 7.6g/dL, plts 869. Lactic acid >15.  He reports throwing up dark blood over the past 2 days. He has epigastric discomfort. He is alert to person and time but not place.   CT Head without acute abnormality. CT C-spine without acute abnormality. Chest radiograph unremarkable.  PCCM called for admission.   Pertinent  Medical History   Past Medical History:  Diagnosis Date   Alcohol  abuse    Anxiety    at age 54   Blood transfusion without reported diagnosis    Cirrhosis (HCC)    Depression    at age 52   Epileptic seizure (HCC)    Not associated with EtOH withdrawal   GERD (gastroesophageal reflux disease)    Hypertension    Nausea and vomiting    Psychosis (HCC)    PTSD (post-traumatic stress disorder)    Schizoaffective disorder    Seizure disorder (HCC)    related to etoh seizure   Symptomatic anemia    Significant Hospital Events: Including procedures, antibiotic start and stop dates in addition to other pertinent events   11/16 admitted to ICU for hemorrhagic shock with UGIB, cirrhosis 11/17 EGD with severe esophagitis 11/19 woke up agitated, not directable 11/20 MAP goal 85 so increased pressors 11/21 Family coming in town soon; will eventually go  comfort care. Currently no escalation.   Interim History / Subjective:  No acute events overnight.  NE 40mcg Vaso 0.04 Propofol  20 Dilaudid  1   Objective    Blood pressure 125/63, pulse 85, temperature 98.3 F (36.8 C), temperature source Axillary, resp. rate 20, height 6' (1.829 m), weight 98 kg, SpO2 96%.    Vent Mode: PRVC FiO2 (%):  [40 %] 40 % Set Rate:  [20 bmp] 20 bmp Vt Set:  [620 mL] 620 mL PEEP:  [5 cmH20] 5 cmH20 Plateau Pressure:  [23 cmH20-26 cmH20] 24 cmH20   Intake/Output Summary (Last 24 hours) at 07/10/2024 0937 Last data filed at 07/10/2024 0800 Gross per 24 hour  Intake 2127.25 ml  Output 1585 ml  Net 542.25 ml   Filed Weights   07/07/24 0500 07/08/24 0448 07/09/24 0500  Weight: 96.9 kg 100 kg 98 kg    Examination: General:  critically ill appearing man lying in bed in NAD HEENT: [place]/AT, eyes icteric, ETT Lungs: breathing comfortably on MV, CTAB, minimal ETT secretions. Pplat 26.  CVS:  S1S2, RRR Abdomen: obese, soft, NT Extremities:   ++edema, Neuro:  RASS -5, pinpoint pupils  I/O +450cc, net +15.2L for admission UOP 210cc  Na+  129 K+4.3 BUN 156 Cr 5.9   Resolved problem list   Hypocalcemia Hypophosphatemia  Hypokalemia Hyperglycemia  Assessment and Plan   Hemorrhagic shock due to  acute UGIB with severe esophagitis, moderate portal HTN gastropathy, duodenal ulcerations. Cannot r/o varices, but none seen.  H/o ETOH cirrhosis with thrombocytopenia, MELDNA 39 (a/w ~50% mortality at 6 months) -con't PPI -con't vasopressors- still on high dose pressors -no additional transfusions per GOC discussion  Ongoing shock- suspect this is mixed etiology with sedation, cirrhosis> No obvious source of sepsis.  -vaso, NE to maintain MAP >65; had been trying to keep MAP higher but renal function continues to deteriorate despite this  Acute metabolic encephalopathy due to HE precipitated by UGIB, now with severe azotemia and AKI H/o  schizoaffective disorder -con't dilaudid  and propofol  -rifaximin , lactulose   Acute respiratory failure with hypoxia requiring MV -LTVV -VAP prevention protocol -PAD protocol; goal RASS -2 to-3. Does not tolerate being more awake due to agitation. Now out of restraints  -planning for terminal extubation this week with family  H/o ETOH abuse and withdrawal seizures -propofol   AKI on CKD with oliguria Hypervolemic hyponatremia Possible HRS -minimal response to 160mg  lasix  -family does not want to pursue RRT, which is appropriate  At risk for malnutrition -TF and vitamins  GOC:  Family working on coming into town from [place] [employer]. Planning for terminal extubation and comfort care this week. No escalation in the interim. Family understands the risk that he may not make it until they are able to get to town.   Labs   CBC: Recent Labs  Lab 07/05/24 0428 07/06/24 0445 07/06/24 1656 07/07/24 0415 07/08/24 0458  WBC 15.7* 16.7* 15.4* 15.5* 24.8*  HGB 8.0* 7.5* 7.2* 7.3* 7.7*  HCT 21.7* 20.3* 20.0* 20.3* 20.9*  MCV 90.8 91.4 93.0 92.7 91.7  PLT 54* 46* 52* 61* 71*    Basic Metabolic Panel: Recent Labs  Lab 07/04/24 0420 07/04/24 1214 07/05/24 1218 07/05/24 1540 07/06/24 0445 07/07/24 0415 07/07/24 1128 07/08/24 0458 07/09/24 0330 07/10/24 0402  NA 123*   < >  --    < > 125* 127* 128* 130* 130* 129*  K 3.4*   < >  --    < > 3.2* 2.8* 3.4* 3.3* 3.8 4.3  CL 77*   < >  --    < > 82* 85* 86* 87* 89* 89*  CO2 25   < >  --    < > 21* 23 22 21* 22 26  GLUCOSE 149*   < >  --    < > 199* 173* 169* 172* 150* 137*  BUN 90*   < >  --    < > 105* 119* 119* 126* 136* 156*  CREATININE 4.40*   < >  --    < > 4.53* 4.38* 4.43* 4.52* 5.20* 5.90*  CALCIUM  7.8*   < >  --    < > 7.1* 7.4* 7.4* 7.5* 7.8* 8.0*  MG 2.4  --  1.9  --  2.2 2.2  --  2.3  --   --   PHOS  --   --  1.7*  --  2.2* 2.0*  2.0*  --  3.1  3.2  --   --    < > = values in this interval not displayed.    GFR: Estimated Creatinine Clearance: 18 mL/min (A) (by C-G formula based on SCr of 5.9 mg/dL (H)). Recent Labs  Lab 07/03/24 1434 07/03/24 1946 07/04/24 0744 07/04/24 1312 07/04/24 1748 07/04/24 2201 07/05/24 0428 07/06/24 0445 07/06/24 1656 07/07/24 0415 07/08/24 0458  WBC  --    < >  --   --    < >  --    < >  16.7* 15.4* 15.5* 24.8*  LATICACIDVEN >9.0*  --  2.5* 2.1*  --  2.6*  --   --   --   --   --    < > = values in this interval not displayed.    Liver Function Tests: Recent Labs  Lab 07/03/24 1946 07/05/24 1540 07/06/24 1031 07/07/24 0415 07/08/24 0458  AST 464* 471* 369*  --  234*  ALT 167* 176* 168*  --  125*  ALKPHOS 152* 100 110  --  178*  BILITOT 19.2* 20.2* 20.8*  --  20.7*  PROT 4.8* 5.9* 5.6*  --  5.6*  ALBUMIN  2.1* 3.4* 3.0* 2.7* 2.4*  2.4*   No results for input(s): LIPASE, AMYLASE in the last 168 hours. Recent Labs  Lab 07/04/24 0420 07/05/24 1218 07/06/24 1454 07/07/24 0415 07/08/24 0458  AMMONIA 88* 93* 120* 88* 77*     Critical care time:       [name], DO 07/10/24 10:20 AM [facility]  For contact information, see Amion. If no response to pager, please call PCCM consult pager. After hours, 7PM- 7AM, please call Elink.

## 2024-07-10 NOTE — Plan of Care (Signed)
  Problem: Education: Goal: Knowledge of General Education information will improve Description: Including pain rating scale, medication(s)/side effects and non-pharmacologic comfort measures Outcome: Not Progressing   Problem: Health Behavior/Discharge Planning: Goal: Ability to manage health-related needs will improve Outcome: Not Progressing   Problem: Clinical Measurements: Goal: Ability to maintain clinical measurements within normal limits will improve Outcome: Not Progressing Goal: Will remain free from infection Outcome: Not Progressing Goal: Diagnostic test results will improve Outcome: Not Progressing Goal: Respiratory complications will improve Outcome: Not Progressing Goal: Cardiovascular complication will be avoided Outcome: Not Progressing   Problem: Activity: Goal: Risk for activity intolerance will decrease Outcome: Not Progressing   Problem: Nutrition: Goal: Adequate nutrition will be maintained Outcome: Not Progressing   Problem: Coping: Goal: Level of anxiety will decrease Outcome: Not Progressing   Problem: Elimination: Goal: Will not experience complications related to bowel motility Outcome: Not Progressing Goal: Will not experience complications related to urinary retention Outcome: Not Progressing   Problem: Pain Managment: Goal: General experience of comfort will improve and/or be controlled Outcome: Not Progressing   Problem: Safety: Goal: Ability to remain free from injury will improve Outcome: Not Progressing   Problem: Skin Integrity: Goal: Risk for impaired skin integrity will decrease Outcome: Not Progressing   Problem: Education: Goal: Ability to describe self-care measures that may prevent or decrease complications (Diabetes Survival Skills Education) will improve Outcome: Not Progressing Goal: Individualized Educational Video(s) Outcome: Not Progressing   Problem: Coping: Goal: Ability to adjust to condition or change in  health will improve Outcome: Not Progressing   Problem: Fluid Volume: Goal: Ability to maintain a balanced intake and output will improve Outcome: Not Progressing   Problem: Health Behavior/Discharge Planning: Goal: Ability to identify and utilize available resources and services will improve Outcome: Not Progressing Goal: Ability to manage health-related needs will improve Outcome: Not Progressing   Problem: Metabolic: Goal: Ability to maintain appropriate glucose levels will improve Outcome: Not Progressing   Problem: Nutritional: Goal: Maintenance of adequate nutrition will improve Outcome: Not Progressing Goal: Progress toward achieving an optimal weight will improve Outcome: Not Progressing   Problem: Skin Integrity: Goal: Risk for impaired skin integrity will decrease Outcome: Not Progressing   Problem: Tissue Perfusion: Goal: Adequacy of tissue perfusion will improve Outcome: Not Progressing   Problem: Safety: Goal: Non-violent Restraint(s) Outcome: Not Progressing

## 2024-07-11 ENCOUNTER — Other Ambulatory Visit (HOSPITAL_COMMUNITY): Payer: Self-pay

## 2024-07-11 DIAGNOSIS — G9341 Metabolic encephalopathy: Secondary | ICD-10-CM | POA: Diagnosis not present

## 2024-07-11 DIAGNOSIS — R571 Hypovolemic shock: Secondary | ICD-10-CM | POA: Diagnosis not present

## 2024-07-11 DIAGNOSIS — J9601 Acute respiratory failure with hypoxia: Secondary | ICD-10-CM | POA: Diagnosis not present

## 2024-07-11 DIAGNOSIS — D62 Acute posthemorrhagic anemia: Secondary | ICD-10-CM | POA: Diagnosis not present

## 2024-07-11 MED ORDER — LEVETIRACETAM 500 MG PO TABS: 500.0000 mg | ORAL_TABLET | Freq: Two times a day (BID) | ORAL | 1 refills | Status: DC

## 2024-07-11 NOTE — Progress Notes (Signed)
 NAME:  [name], MRN:  [id number], DOB:  1970-09-15, LOS: 8 ADMISSION DATE:  07/03/2024, CONSULTATION DATE:  07/03/24 REFERRING MD:  EDP CHIEF COMPLAINT:  Shock   History of Present Illness:  [name] is a 53 year old male, daily smoker with history of alcohol  dependence, withdrawal seizures, liver cirrhosis, PTSD, and schizoaffective disorder who is brought in by EMS after falling and being found on the floor covered in urine and feces.   In the ER, vitals showed temp of 94.42F, BP 74/39, RR 25, Spo2 94% on 4L O2.   Labs show Na 126, K 3.1, Cl <65, Serum bicarb 17, Glucose 61, Cr 4.83 (baseline 0.79), Alk phos 171, albumin  2.1, AST/ALT 384/134, Ammonia 77, T. Bili 15.5, CK 285, Trop 25. CBC shows WBC 14, Hgb 7.6g/dL, plts 869. Lactic acid >15.  He reports throwing up dark blood over the past 2 days. He has epigastric discomfort. He is alert to person and time but not place.   CT Head without acute abnormality. CT C-spine without acute abnormality. Chest radiograph unremarkable.  PCCM called for admission.   Pertinent  Medical History   Past Medical History:  Diagnosis Date   Alcohol  abuse    Anxiety    at age 42   Blood transfusion without reported diagnosis    Cirrhosis (HCC)    Depression    at age 32   Epileptic seizure (HCC)    Not associated with EtOH withdrawal   GERD (gastroesophageal reflux disease)    Hypertension    Nausea and vomiting    Psychosis (HCC)    PTSD (post-traumatic stress disorder)    Schizoaffective disorder    Seizure disorder (HCC)    related to etoh seizure   Symptomatic anemia    Significant Hospital Events: Including procedures, antibiotic start and stop dates in addition to other pertinent events   11/16 admitted to ICU for hemorrhagic shock with UGIB, cirrhosis 11/17 EGD with severe esophagitis 11/19 woke up agitated, not directable 11/20 MAP goal 85 so increased pressors 11/21 Family coming in town soon; will eventually go  comfort care. Currently no escalation.  11/24[name]; plan for terminal extubation 07/15/24   Interim History / Subjective:  NAEON; remains on high dose pressors, propofol , dilaudid , rass -5. Plan for family to arrive late tonight from [facility]. Terminally extubate July 15, 2024  Objective    Blood pressure 123/62, pulse 84, temperature 98.1 F (36.7 C), temperature source Axillary, resp. rate 20, height 6' (1.829 m), weight 98 kg, SpO2 97%.    Vent Mode: PRVC FiO2 (%):  [40 %] 40 % Set Rate:  [20 bmp] 20 bmp Vt Set:  [379 mL] 620 mL PEEP:  [5 cmH20] 5 cmH20 Plateau Pressure:  [24 cmH20-26 cmH20] 25 cmH20   Intake/Output Summary (Last 24 hours) at 07/11/2024 0656 Last data filed at 07/11/2024 0600 Gross per 24 hour  Intake 2146.92 ml  Output 1775 ml  Net 371.92 ml   Filed Weights   07/07/24 0500 07/08/24 0448 07/09/24 0500  Weight: 96.9 kg 100 kg 98 kg    Examination: General:  middle aged male, critically ill appearing  HEENT: ncat, icteric sclera, conjunctival edema Lungs: vented, resp even and unlabored, clear bilaterally  CVS: s1s2, no m/r/g Abdomen: rounded, soft, ntnd Extremities: 3+ pitting edema in all extremities  Neuro: sedated, pupils 3mm reactive, rass -5  Resolved problem list   Hypocalcemia Hypophosphatemia  Hypokalemia Hyperglycemia  Assessment and Plan   Hemorrhagic shock due to acute  UGIB with severe esophagitis, moderate portal HTN gastropathy, duodenal ulcerations. Cannot r/o varices, but none seen.  H/o ETOH cirrhosis with thrombocytopenia, MELDNA 39 (a/w ~50% mortality at 6 months) Ongoing shock- suspect this is mixed etiology with sedation, cirrhosis> No obvious source of sepsis.  Acute metabolic encephalopathy due to HE precipitated by UGIB, now with severe azotemia and AKI H/o schizoaffective disorder Acute respiratory failure with hypoxia requiring MV H/o ETOH abuse and withdrawal seizures AKI on CKD with oliguria Hypervolemic  hyponatremia Possible HRS  - cont PPT, no further transfusions per GOC  - con't levo, vaso for MAP > 65 - on high dose - will not escalate further per GOC discussion - lactulose  30 q6h, rifaximin  550mg  TID  - propofol  and dilaudid   - keep vented, plan is for terminal extubation once family from [facility] arrives - tentatively that is this evening per nursing and with plan for extubation tomorrow 2024-08-11  - RASS is -5  - had minimal renal response to 160mg  IV lasix   - family does not wish to pursue RRT  - on TF and thiamine   - can continue zosyn  for presumed sepsis with leukocytosis - no clear source - no escalation   GOC:  Family on their way from [facility]. Per nursing 11/24 AM family to arrive late tonight with plan to come in 08-11-24 for terminal extubation. Family understands the risk that he may not make it until they are able to get to town.   Labs   CBC: Recent Labs  Lab 07/05/24 0428 07/06/24 0445 07/06/24 1656 07/07/24 0415 07/08/24 0458  WBC 15.7* 16.7* 15.4* 15.5* 24.8*  HGB 8.0* 7.5* 7.2* 7.3* 7.7*  HCT 21.7* 20.3* 20.0* 20.3* 20.9*  MCV 90.8 91.4 93.0 92.7 91.7  PLT 54* 46* 52* 61* 71*    Basic Metabolic Panel: Recent Labs  Lab 07/05/24 1218 07/05/24 1540 07/06/24 0445 07/07/24 0415 07/07/24 1128 07/08/24 0458 07/09/24 0330 07/10/24 0402  NA  --    < > 125* 127* 128* 130* 130* 129*  K  --    < > 3.2* 2.8* 3.4* 3.3* 3.8 4.3  CL  --    < > 82* 85* 86* 87* 89* 89*  CO2  --    < > 21* 23 22 21* 22 26  GLUCOSE  --    < > 199* 173* 169* 172* 150* 137*  BUN  --    < > 105* 119* 119* 126* 136* 156*  CREATININE  --    < > 4.53* 4.38* 4.43* 4.52* 5.20* 5.90*  CALCIUM   --    < > 7.1* 7.4* 7.4* 7.5* 7.8* 8.0*  MG 1.9  --  2.2 2.2  --  2.3  --   --   PHOS 1.7*  --  2.2* 2.0*  2.0*  --  3.1  3.2  --   --    < > = values in this interval not displayed.   GFR: Estimated Creatinine Clearance: 18 mL/min (A) (by C-G formula based on SCr of 5.9 mg/dL (H)). Recent  Labs  Lab 07/04/24 0744 07/04/24 1312 07/04/24 1748 07/04/24 2201 07/05/24 0428 07/06/24 0445 07/06/24 1656 07/07/24 0415 07/08/24 0458  WBC  --   --    < >  --    < > 16.7* 15.4* 15.5* 24.8*  LATICACIDVEN 2.5* 2.1*  --  2.6*  --   --   --   --   --    < > = values in  this interval not displayed.    Liver Function Tests: Recent Labs  Lab 07/05/24 1540 07/06/24 1031 07/07/24 0415 07/08/24 0458  AST 471* 369*  --  234*  ALT 176* 168*  --  125*  ALKPHOS 100 110  --  178*  BILITOT 20.2* 20.8*  --  20.7*  PROT 5.9* 5.6*  --  5.6*  ALBUMIN  3.4* 3.0* 2.7* 2.4*  2.4*   No results for input(s): LIPASE, AMYLASE in the last 168 hours. Recent Labs  Lab 07/05/24 1218 07/06/24 1454 07/07/24 0415 07/08/24 0458  AMMONIA 93* 120* 88* 77*     Critical care time: 28    The patient is critically ill with multiple organ system failure and requires high complexity decision making for assessment and support, frequent evaluation and titration of therapies, advanced monitoring, review of radiographic studies and interpretation of complex data.    Critical Care Time devoted to patient care services, exclusive of separately billable procedures, described in this note is 32  Tinnie FORBES Adolph [facility]ON[facility] 07/11/24 9:22 AM  Please see [email or web address] for pager details.  From 7A-7P if no response, please call [phone] After hours, please call ELink [phone]

## 2024-07-11 NOTE — Plan of Care (Signed)
  Problem: Education: Goal: Knowledge of General Education information will improve Description: Including pain rating scale, medication(s)/side effects and non-pharmacologic comfort measures Outcome: Not Progressing   Problem: Health Behavior/Discharge Planning: Goal: Ability to manage health-related needs will improve Outcome: Not Progressing   Problem: Clinical Measurements: Goal: Ability to maintain clinical measurements within normal limits will improve Outcome: Not Progressing Goal: Will remain free from infection Outcome: Not Progressing Goal: Diagnostic test results will improve Outcome: Not Progressing

## 2024-07-11 NOTE — Plan of Care (Signed)
  Problem: Clinical Measurements: Goal: Cardiovascular complication will be avoided Outcome: Progressing   Problem: Nutrition: Goal: Adequate nutrition will be maintained Outcome: Progressing   Problem: Pain Managment: Goal: General experience of comfort will improve and/or be controlled Outcome: Progressing

## 2024-07-12 DIAGNOSIS — D62 Acute posthemorrhagic anemia: Secondary | ICD-10-CM | POA: Diagnosis not present

## 2024-07-12 DIAGNOSIS — R571 Hypovolemic shock: Secondary | ICD-10-CM | POA: Diagnosis not present

## 2024-07-12 DIAGNOSIS — J9601 Acute respiratory failure with hypoxia: Secondary | ICD-10-CM | POA: Diagnosis not present

## 2024-07-12 DIAGNOSIS — G9341 Metabolic encephalopathy: Secondary | ICD-10-CM | POA: Diagnosis not present

## 2024-07-12 MED ORDER — ONDANSETRON HCL 4 MG/2ML IJ SOLN: 4.0000 mg | Freq: Four times a day (QID) | INTRAMUSCULAR | Status: DC | PRN

## 2024-07-12 MED ORDER — MIDAZOLAM HCL (PF) 2 MG/2ML IJ SOLN: 2.0000 mg | INTRAMUSCULAR | Status: DC | PRN

## 2024-07-12 MED ORDER — ACETAMINOPHEN 650 MG RE SUPP: 650.0000 mg | Freq: Four times a day (QID) | RECTAL | Status: DC | PRN

## 2024-07-12 MED ORDER — GLYCOPYRROLATE 0.2 MG/ML IJ SOLN
0.2000 mg | INTRAMUSCULAR | Status: DC | PRN
Start: 2024-07-12 — End: 2024-07-13
  Administered 2024-07-12: 0.2 mg via INTRAVENOUS
  Filled 2024-07-12: qty 1

## 2024-07-12 MED ORDER — POLYVINYL ALCOHOL 1.4 % OP SOLN: 1.0000 [drp] | Freq: Four times a day (QID) | OPHTHALMIC | Status: DC | PRN

## 2024-07-12 MED ORDER — GLYCOPYRROLATE 0.2 MG/ML IJ SOLN: 0.2000 mg | INTRAMUSCULAR | Status: DC | PRN

## 2024-07-12 MED ORDER — GLYCOPYRROLATE 1 MG PO TABS: 1.0000 mg | ORAL_TABLET | ORAL | Status: DC | PRN

## 2024-07-12 MED ORDER — ACETAMINOPHEN 325 MG PO TABS: 650.0000 mg | ORAL_TABLET | Freq: Four times a day (QID) | ORAL | Status: DC | PRN

## 2024-07-12 MED ORDER — ONDANSETRON 4 MG PO TBDP: 4.0000 mg | ORAL_TABLET | Freq: Four times a day (QID) | ORAL | Status: DC | PRN

## 2024-07-12 MED ORDER — HALOPERIDOL LACTATE 5 MG/ML IJ SOLN: 2.5000 mg | INTRAMUSCULAR | Status: DC | PRN

## 2024-07-12 MED ORDER — SODIUM CHLORIDE 0.9 % IV SOLN: INTRAVENOUS | Status: DC

## 2024-07-18 NOTE — Death Summary Note (Signed)
 DEATH SUMMARY   Patient Details  Name: [name] MRN: [id number] DOB: [birth year]  Admission/Discharge Information   Admit Date:  07/25/24  Date of Death: Date of Death: August 03, 2024  Time of Death: Time of Death: 08-09-09  Length of Stay: Aug 17, 2024  Referring Physician: Tanda Bleacher, MD   Reason(s) for Hospitalization  Acute upper GIB with shock   Diagnoses  Preliminary cause of death: multiorgan failure (renal, hepatic) Secondary Diagnoses (including complications and co-morbidities):  Principal Problem:   Shock (HCC) Active Problems:   Acute blood loss anemia   Portal hypertensive gastropathy (HCC)   Duodenal ulcer   Acute renal failure   Brief Hospital Course (including significant findings, care, treatment, and services provided and events leading to death)  [name] is a 53 y.o. year old male who daily smoker with history of alcohol  dependence, withdrawal seizures, liver cirrhosis, PTSD, and schizoaffective disorder who was brought in by EMS after falling and being found on the floor covered in urine and feces.    In the ER, vitals showed temp of 94.40F, BP 74/39, RR 25, Spo2 94% on 4L O2.   Labs show Na 126, K 3.1, Cl <65, Serum bicarb 17, Glucose 61, Cr 4.83 (baseline 0.79), Alk phos 171, albumin  2.1, AST/ALT 384/134, Ammonia 77, T. Bili 15.5, CK 285, Trop 25. CBC shows WBC 14, Hgb 7.6g/dL, plts 869. Lactic acid >15.   He reported throwing up dark blood over the past 2 days. He had epigastric discomfort.   CT Head without acute abnormality. CT C-spine without acute abnormality. Chest radiograph unremarkable.   PCCM called for admission.   He was admitted to ICU for acute upper GIB with hemorrhagic shock on 07/25/2024. On 11/17 he had an upper endoscopy with GI showing severe esophagitis, moderate portal hypertension, shallow erythematous duodenal ulcers. On 11/17 no further bleeding, but was intubated for EGD and now with worsening encephalopathy on levo @ 17mcg. He had been  anuric for a few hours. Lactulose  was restarted and nephrology consulted for CRRT consideration. 11/19 receiving platelets and prn blood products. He had increasing vasopressor requirements adding vasopressin  in addition to levophed . Multiple attempts made to wake up and SAT/SBT but agitated and unable to redirect. On 11/20 IPAL discussion with daughter who felt CRRT would not be in alignment given his multiorgan failure and poor prognosis overall. On 11/21 repeated IPAL discussion with patient's sisters in addition to daughter. Patient was made DNR, no escalation. Waiting on family to travel from [facility]. He was kept comfortable on dilaudid  and propofol . CRRT was never started. He remained on pressor support until family arrived on 2024-08-03. We had review of events during admission and rediscussed previous IPAL conversations. They are all still in agreement that CRRT is unreasonable which I agree with. He is still in MOF with futile chances of any meaningful recovery. We proceeded with full comfort measures and he passed away surrounded by family comfortably at 1610.   Pertinent Labs and Studies  Significant Diagnostic Studies DG Abd 1 View Result Date: 07/07/2024 CLINICAL DATA:  Orogastric tube placement. EXAM: ABDOMEN - 1 VIEW COMPARISON:  Pelvis radiograph 2024-07-25 FINDINGS: Tip of the enteric tube below the diaphragm in the stomach. The side port is just beyond the gastroesophageal junction. Advancement of a few cm would lead to optimal placement. Gaseous small bowel distension on pelvis radiograph base is not included in the field of view. IMPRESSION: Tip of the enteric tube below the diaphragm in the stomach. The side port is  just beyond the gastroesophageal junction. Advancement of a few cm would lead to optimal placement. Electronically Signed   By: Andrea Gasman M.D.   On: 07/07/2024 11:27   DG CHEST PORT 1 VIEW Result Date: 07/04/2024 CLINICAL DATA:  Status post central line placement and  intubation EXAM: PORTABLE CHEST 1 VIEW COMPARISON:  Chest radiograph dated 07/03/2024 FINDINGS: Lines/tubes: Endotracheal tube tip projects 5.2 cm above the carina. Right internal jugular venous catheter tip projects over the SVC. Lungs: Mildly low lung volumes. Increased bilateral lower lung patchy and linear opacities, left-greater-than-right. Pleura: No pneumothorax or pleural effusion. Heart/mediastinum: The heart size and mediastinal contours are within normal limits. Bones: No acute osseous abnormality. IMPRESSION: 1. Endotracheal tube tip projects 5.2 cm above the carina. 2. Right internal jugular venous catheter tip projects over the SVC. No pneumothorax. 3. Increased bilateral lower lung patchy and linear opacities, left-greater-than-right, likely atelectasis. Electronically Signed   By: Limin  Xu M.D.   On: 07/04/2024 13:35   US  Abdomen Limited RUQ (LIVER/GB) Result Date: 07/03/2024 EXAM: Right Upper Quadrant Abdominal Ultrasound 07/03/2024 05:30:40 AM TECHNIQUE: Real-time ultrasonography of the right upper quadrant of the abdomen was performed. COMPARISON: Abdominal sonogram dated 01/08/2024. CLINICAL HISTORY: [id number] Shock (HCC) [id number]. FINDINGS: LIVER: The hepatic parenchyma is echogenic and coarse. There is poor sonographic penetration. No intrahepatic biliary ductal dilatation. No lesions are evident. BILIARY SYSTEM: No pericholecystic fluid or wall thickening. No cholelithiasis. Negative sonographic Murphy's sign. The common bile duct is not visualized. RIGHT KIDNEY: The right kidney is grossly unremarkable in appearances without evidence of hydronephrosis, echogenic calculi or worrisome mass lesions. PANCREAS: Visualized portions of the pancreas are unremarkable. OTHER: No right upper quadrant ascites. IMPRESSION: 1. Echogenic and coarse hepatic parenchyma with poor sonographic penetration; no focal hepatic lesion identified 2. No evidence of cholelithiasis or cholecystitis 3. Common bile duct  not visualized Electronically signed by: Evalene Coho MD 07/03/2024 05:34 AM EST RP Workstation: [id number]   CT Cervical Spine Wo Contrast Result Date: 07/03/2024 EXAM: CT CERVICAL SPINE WITHOUT CONTRAST 07/03/2024 03:59:58 AM TECHNIQUE: CT of the cervical spine was performed without the administration of intravenous contrast. Multiplanar reformatted images are provided for review. Automated exposure control, iterative reconstruction, and/or weight based adjustment of the mA/kV was utilized to reduce the radiation dose to as low as reasonably achievable. COMPARISON: CT of the cervical spine dated 08/26/2021. CLINICAL HISTORY: Neck trauma, dangerous injury mechanism (Age 55-64y). FINDINGS: CERVICAL SPINE: BONES AND ALIGNMENT: No acute fracture or traumatic malalignment. There is mild reversal of the normal cervical lordosis. DEGENERATIVE CHANGES: There is an anterior bridging osteophyte present at C4-C5. There are also prominent osteophytes at C5-C6. SOFT TISSUES: No prevertebral soft tissue swelling. IMPRESSION: 1. No acute abnormality of the cervical spine 2. Mild reversal of the normal cervical lordosis 3. Anterior bridging osteophyte at C4-5 and prominent osteophytes at C5-6 Electronically signed by: Evalene Coho MD 07/03/2024 04:17 AM EST RP Workstation: [id number]   CT Head Wo Contrast Result Date: 07/03/2024 EXAM: CT HEAD WITHOUT CONTRAST 07/03/2024 03:59:58 AM TECHNIQUE: CT of the head was performed without the administration of intravenous contrast. Automated exposure control, iterative reconstruction, and/or weight based adjustment of the mA/kV was utilized to reduce the radiation dose to as low as reasonably achievable. COMPARISON: 03/06/2024 CLINICAL HISTORY: Facial trauma, blunt. FINDINGS: BRAIN AND VENTRICLES: No acute hemorrhage. No evidence of acute infarct. Proportional prominence of ventricles and sulci, consistent with diffuse cerebral parenchymal volume loss. No hydrocephalus. No  extra-axial collection. No mass effect or midline shift.  ORBITS: No acute abnormality. SINUSES: No acute abnormality. SOFT TISSUES AND SKULL: No acute soft tissue abnormality. No skull fracture. IMPRESSION: 1. No acute intracranial abnormality related to facial trauma. 2. Proportional prominence of ventricles and sulci, consistent with diffuse cerebral parenchymal volume loss. Electronically signed by: Evalene Coho MD 07/03/2024 04:15 AM EST RP Workstation: [id number]   DG Pelvis Portable Result Date: 07/03/2024 EXAM: 1 or 2 VIEW(S) XRAY OF THE PELVIS 07/03/2024 02:57:33 AM COMPARISON: CT abdomen and pelvis with contrast 08/26/2021. CLINICAL HISTORY: fall at home, in floor for 12 hours FINDINGS: BONES AND JOINTS: No acute fracture. A bone island is noted chronically in the lower right ilium. No joint dislocation. SOFT TISSUES: There is dilatation of the central small bowel up to 3.7 cm, which could be due to ileus or obstruction. Further evaluation recommended. Again noted are dystrophic soft tissue calcifications alongside the right greater trochanter, on CT were within the gluteus medius/minimus insertional fibers. . Soft tissues are otherwise unremarkable. IMPRESSION: 1. Dilatation of the central small bowel up to 3.7 cm, possibly due to ileus or obstruction. Further evaluation is recommended. 2. No AP evidence of  acute fracture or dislocation. Electronically signed by: Francis Quam MD 07/03/2024 03:12 AM EST RP Workstation: [id number]   DG Chest Port 1 View Result Date: 07/03/2024 EXAM: 1 VIEW(S) XRAY OF THE CHEST 07/03/2024 02:57:33 AM COMPARISON: Portable chest 03/08/2024. CLINICAL HISTORY: Fall at home, in floor for 12 hours. FINDINGS: LINES, TUBES AND DEVICES: There are overlying monitor leads. LUNGS AND PLEURA: No focal pulmonary opacity. No pleural effusion. No pneumothorax. HEART AND MEDIASTINUM: No acute abnormality of the cardiac and mediastinal silhouettes. BONES AND SOFT TISSUES: No  acute osseous abnormality. IMPRESSION: 1. No acute cardiopulmonary process identified. Stable chest. Electronically signed by: Francis Quam MD 07/03/2024 03:06 AM EST RP Workstation: [id number]    Microbiology Recent Results (from the past 240 hours)  Blood culture (routine x 2)     Status: None   Collection Time: 07/03/24  2:57 AM   Specimen: BLOOD RIGHT ARM  Result Value Ref Range Status   Specimen Description BLOOD RIGHT ARM  Final   Special Requests   Final    BOTTLES DRAWN AEROBIC AND ANAEROBIC Blood Culture adequate volume   Culture   Final    NO GROWTH 5 DAYS Performed at [facility], [address], [place], [place] [address]    Report Status 07/08/2024 FINAL  Final  Blood culture (routine x 2)     Status: None   Collection Time: 07/03/24  2:57 AM   Specimen: BLOOD  Result Value Ref Range Status   Specimen Description BLOOD BLOOD LEFT ARM  Final   Special Requests   Final    BOTTLES DRAWN AEROBIC AND ANAEROBIC Blood Culture adequate volume   Culture   Final    NO GROWTH 5 DAYS Performed at [facility], [address] [place], [place] [address]    Report Status 07/08/2024 FINAL  Final  MRSA Next Gen by PCR, Nasal     Status: None   Collection Time: 07/03/24  5:34 AM   Specimen: Nasal Mucosa; Nasal Swab  Result Value Ref Range Status   MRSA by PCR Next Gen NOT DETECTED NOT DETECTED Final    Comment: (NOTE) The GeneXpert MRSA Assay (FDA approved for NASAL specimens only), is one component of a comprehensive MRSA colonization surveillance program. It is not intended to diagnose MRSA infection nor to guide or monitor treatment for MRSA infections.  Test performance is not FDA approved in patients less than 51 years old. Performed at [facility], [address] [place], [place] [address]     Lab Basic Metabolic Panel: Recent Labs  Lab 07/06/24 0445 07/07/24 0415 07/07/24 1128 07/08/24 0458 07/09/24 0330 07/10/24 0402  NA 125* 127* 128*  130* 130* 129*  K 3.2* 2.8* 3.4* 3.3* 3.8 4.3  CL 82* 85* 86* 87* 89* 89*  CO2 21* 23 22 21* 22 26  GLUCOSE 199* 173* 169* 172* 150* 137*  BUN 105* 119* 119* 126* 136* 156*  CREATININE 4.53* 4.38* 4.43* 4.52* 5.20* 5.90*  CALCIUM  7.1* 7.4* 7.4* 7.5* 7.8* 8.0*  MG 2.2 2.2  --  2.3  --   --   PHOS 2.2* 2.0*  2.0*  --  3.1  3.2  --   --    Liver Function Tests: Recent Labs  Lab 07/06/24 1031 07/07/24 0415 07/08/24 0458  AST 369*  --  234*  ALT 168*  --  125*  ALKPHOS 110  --  178*  BILITOT 20.8*  --  20.7*  PROT 5.6*  --  5.6*  ALBUMIN  3.0* 2.7* 2.4*  2.4*   No results for input(s): LIPASE, AMYLASE in the last 168 hours. Recent Labs  Lab 07/06/24 1454 07/07/24 0415 07/08/24 0458  AMMONIA 120* 88* 77*   CBC: Recent Labs  Lab 07/06/24 0445 07/06/24 1656 07/07/24 0415 07/08/24 0458  WBC 16.7* 15.4* 15.5* 24.8*  HGB 7.5* 7.2* 7.3* 7.7*  HCT 20.3* 20.0* 20.3* 20.9*  MCV 91.4 93.0 92.7 91.7  PLT 46* 52* 61* 71*   Cardiac Enzymes: No results for input(s): CKTOTAL, CKMB, CKMBINDEX, TROPONINI in the last 168 hours. Sepsis Labs: Recent Labs  Lab 07/06/24 0445 07/06/24 1656 07/07/24 0415 07/08/24 0458  WBC 16.7* 15.4* 15.5* 24.8*    Procedures/Operations  EGD, intubation, central lines,    Tinnie FORBES Furth, PA-C [facility] 07/13/24 11:16 AM  Please see [email or web address] for pager details.  From 7A-7P if no response, please call [phone] After hours, please call ELink [phone]

## 2024-07-18 NOTE — Progress Notes (Signed)
   2024/07/29 0750  Daily Weaning Assessment  Daily Assessment of Readiness to Wean Wean protocol criteria met (SBT performed)  Reason not met Apnea  SBT Method CPAP 5 cm H20 and PS 5 cm H20 (8/5)  Weaning Start Time 0750  Patient response Failed SBT terminated  Reason SBT Terminated Apnea

## 2024-07-18 NOTE — Progress Notes (Signed)
 This chaplain responded to a unit consult,  in the setting of the family's request, for EOL spiritual care. The chaplain reviewed the Pt. chart notes and received an update upon arrival from the Pt. RN.  The Pt. resting comfortably and surrounded by family at the bedside. The family accepted the chaplain's invitation for story telling. The chaplain listened as the family shared the Pt. successes and challenges. The chaplain shared prayer with the Pt. and family as requested by the family.  This chaplain is available for F/U spiritual care as needed.  Chaplain Leeroy Hummer [phone]

## 2024-07-18 NOTE — Progress Notes (Signed)
 NAME:  [name], MRN:  [id number], DOB:  1971-07-01, LOS: 9 ADMISSION DATE:  07/03/2024, CONSULTATION DATE:  07/03/24 REFERRING MD:  EDP CHIEF COMPLAINT:  Shock   History of Present Illness:  [name] is a 53 year old male, daily smoker with history of alcohol  dependence, withdrawal seizures, liver cirrhosis, PTSD, and schizoaffective disorder who is brought in by EMS after falling and being found on the floor covered in urine and feces.   In the ER, vitals showed temp of 94.23F, BP 74/39, RR 25, Spo2 94% on 4L O2.   Labs show Na 126, K 3.1, Cl <65, Serum bicarb 17, Glucose 61, Cr 4.83 (baseline 0.79), Alk phos 171, albumin  2.1, AST/ALT 384/134, Ammonia 77, T. Bili 15.5, CK 285, Trop 25. CBC shows WBC 14, Hgb 7.6g/dL, plts 869. Lactic acid >15.  He reports throwing up dark blood over the past 2 days. He has epigastric discomfort. He is alert to person and time but not place.   CT Head without acute abnormality. CT C-spine without acute abnormality. Chest radiograph unremarkable.  PCCM called for admission.   Pertinent  Medical History   Past Medical History:  Diagnosis Date   Alcohol  abuse    Anxiety    at age 48   Blood transfusion without reported diagnosis    Cirrhosis (HCC)    Depression    at age 61   Epileptic seizure (HCC)    Not associated with EtOH withdrawal   GERD (gastroesophageal reflux disease)    Hypertension    Nausea and vomiting    Psychosis (HCC)    PTSD (post-traumatic stress disorder)    Schizoaffective disorder    Seizure disorder (HCC)    related to etoh seizure   Symptomatic anemia    Significant Hospital Events: Including procedures, antibiotic start and stop dates in addition to other pertinent events   11/16 admitted to ICU for hemorrhagic shock with UGIB, cirrhosis 11/17 EGD with severe esophagitis 11/19 woke up agitated, not directable 11/20 MAP goal 85 so increased pressors 11/21 Family coming in town soon; will eventually go  comfort care. Currently no escalation.  11/24[name]; plan for terminal extubation 07-Aug-2024   Interim History / Subjective:  NAEON; reportedly friend of family came late last night briefly. Will call family this morning to reassess plan for terminal extubation today   Objective    Blood pressure (!) 112/53, pulse 79, temperature 98.1 F (36.7 C), temperature source Axillary, resp. rate 20, height 6' (1.829 m), weight 98 kg, SpO2 96%.    Vent Mode: PRVC FiO2 (%):  [40 %] 40 % Set Rate:  [20 bmp] 20 bmp Vt Set:  [379 mL] 620 mL PEEP:  [5 cmH20] 5 cmH20 Plateau Pressure:  [19 cmH20-22 cmH20] 22 cmH20   Intake/Output Summary (Last 24 hours) at 08-07-2024 0902 Last data filed at 08-07-24 0800 Gross per 24 hour  Intake 2388.86 ml  Output 2280 ml  Net 108.86 ml   Filed Weights   07/07/24 0500 07/08/24 0448 07/09/24 0500  Weight: 96.9 kg 100 kg 98 kg    Examination: General:  middle aged male, critically ill appearing  HEENT: ncat, icteric sclera, conjunctival edema Lungs: vented, resp even and unlabored, expiratory wheezing bilaterally  CVS: s1s2, no m/r/g Abdomen: rounded, soft, ntnd Extremities: 3+ pitting edema in all extremities  Neuro: sedated, pupils 3mm reactive, rass -5  Resolved problem list   Hypocalcemia Hypophosphatemia  Hypokalemia Hyperglycemia  Assessment and Plan   Hemorrhagic shock due  to acute UGIB with severe esophagitis, moderate portal HTN gastropathy, duodenal ulcerations. Cannot r/o varices, but none seen.  H/o ETOH cirrhosis with thrombocytopenia, MELDNA 39 (a/w ~50% mortality at 6 months) Ongoing shock- suspect this is mixed etiology with sedation, cirrhosis> No obvious source of sepsis.  Acute metabolic encephalopathy due to HE precipitated by UGIB, now with severe azotemia and AKI H/o schizoaffective disorder Acute respiratory failure with hypoxia requiring MV H/o ETOH abuse and withdrawal seizures AKI on CKD with oliguria Hypervolemic  hyponatremia Possible HRS  - cont PPI, no further transfusions per GOC  - con't levo, vaso for MAP > 65 - on high dose - will not escalate further per GOC discussion - lactulose  30 q6h, rifaximin  550mg  TID  - propofol  and dilaudid   - keep vented, plan is for terminal extubation once family from [facility] arrives - tentatively that is this evening per nursing and with plan for extubation tomorrow 2024/08/01  - RASS is -5  - had minimal renal response to 160mg  IV lasix   - family does not wish to pursue RRT  - on TF and thiamine   - can continue zosyn  for presumed sepsis with leukocytosis - no clear source - no escalation   GOC:  Family on their way from [facility]. Will call family this morning to assess timeline for arrival and one way extubation    Labs   CBC: Recent Labs  Lab 07/06/24 0445 07/06/24 1656 07/07/24 0415 07/08/24 0458  WBC 16.7* 15.4* 15.5* 24.8*  HGB 7.5* 7.2* 7.3* 7.7*  HCT 20.3* 20.0* 20.3* 20.9*  MCV 91.4 93.0 92.7 91.7  PLT 46* 52* 61* 71*    Basic Metabolic Panel: Recent Labs  Lab 07/05/24 1218 07/05/24 1540 07/06/24 0445 07/07/24 0415 07/07/24 1128 07/08/24 0458 07/09/24 0330 07/10/24 0402  NA  --    < > 125* 127* 128* 130* 130* 129*  K  --    < > 3.2* 2.8* 3.4* 3.3* 3.8 4.3  CL  --    < > 82* 85* 86* 87* 89* 89*  CO2  --    < > 21* 23 22 21* 22 26  GLUCOSE  --    < > 199* 173* 169* 172* 150* 137*  BUN  --    < > 105* 119* 119* 126* 136* 156*  CREATININE  --    < > 4.53* 4.38* 4.43* 4.52* 5.20* 5.90*  CALCIUM   --    < > 7.1* 7.4* 7.4* 7.5* 7.8* 8.0*  MG 1.9  --  2.2 2.2  --  2.3  --   --   PHOS 1.7*  --  2.2* 2.0*  2.0*  --  3.1  3.2  --   --    < > = values in this interval not displayed.   GFR: Estimated Creatinine Clearance: 18 mL/min (A) (by C-G formula based on SCr of 5.9 mg/dL (H)). Recent Labs  Lab 07/06/24 0445 07/06/24 1656 07/07/24 0415 07/08/24 0458  WBC 16.7* 15.4* 15.5* 24.8*    Liver Function Tests: Recent Labs  Lab  07/05/24 1540 07/06/24 1031 07/07/24 0415 07/08/24 0458  AST 471* 369*  --  234*  ALT 176* 168*  --  125*  ALKPHOS 100 110  --  178*  BILITOT 20.2* 20.8*  --  20.7*  PROT 5.9* 5.6*  --  5.6*  ALBUMIN  3.4* 3.0* 2.7* 2.4*  2.4*   No results for input(s): LIPASE, AMYLASE in the last 168 hours. Recent Labs  Lab  07/05/24 1218 07/06/24 1454 07/07/24 0415 07/08/24 0458  AMMONIA 93* 120* 88* 77*     Critical care time: 30    The patient is critically ill with multiple organ system failure and requires high complexity decision making for assessment and support, frequent evaluation and titration of therapies, advanced monitoring, review of radiographic studies and interpretation of complex data.    Critical Care Time devoted to patient care services, exclusive of separately billable procedures, described in this note is 30  Tinnie FORBES Furth, PA-C [facility] 2024/08/08 9:02 AM  Please see [email or web address] for pager details.  From 7A-7P if no response, please call [phone] After hours, please call ELink [phone]

## 2024-07-18 NOTE — Procedures (Signed)
 Extubation Procedure Note  Patient Details:   Name: [name] DOB: 04-24-1971 MRN: [id number]   Airway Documentation:    Vent end date: 08-05-24 Vent end time: 1425   Evaluation  O2 sats: transiently fell during during procedure and currently acceptable Complications: No apparent complications Patient did tolerate procedure well. Bilateral Breath Sounds: Clear, Diminished   No  Pt extubated to comfort care measures per physician order/family request without complication. Pt suctioned via ETT and orally prior. RT will continue to be available as needed.    Geofm FORBES Batty August 05, 2024, 2:29 PM

## 2024-07-18 DEATH — deceased

## 2024-08-04 ENCOUNTER — Ambulatory Visit: Payer: Self-pay | Admitting: Family Medicine
